# Patient Record
Sex: Female | Born: 1945 | Race: Black or African American | Hispanic: No | Smoking: Never smoker
Health system: Southern US, Community
[De-identification: ages and names within clinical notes are randomized; demographics above are authoritative.]

## PROBLEM LIST (undated history)

## (undated) ENCOUNTER — Emergency Department (HOSPITAL_COMMUNITY): Admission: EM

## (undated) DIAGNOSIS — I10 Essential (primary) hypertension: Secondary | ICD-10-CM

## (undated) DIAGNOSIS — I639 Cerebral infarction, unspecified: Secondary | ICD-10-CM

## (undated) DIAGNOSIS — E119 Type 2 diabetes mellitus without complications: Secondary | ICD-10-CM

## (undated) DIAGNOSIS — K219 Gastro-esophageal reflux disease without esophagitis: Secondary | ICD-10-CM

## (undated) DIAGNOSIS — Z9289 Personal history of other medical treatment: Secondary | ICD-10-CM

## (undated) HISTORY — PX: ABDOMINAL HYSTERECTOMY: SHX81

## (undated) NOTE — *Deleted (*Deleted)
  Physical Exam  BP (!) 165/92   Pulse 78   Temp 98.4 F (36.9 C) (Oral)   Resp 20   Ht 1.626 m (5\' 4" )   Wt 70 kg   SpO2 98%   BMI 26.49 kg/m   Physical Exam  ED Course/Procedures     Procedures  MDM  Patient with multiple recent presentations for hand tingling.  She describes this as neck pain rating down her shoulders and paresthesias in her hands.  She has been seen multiple times and had a CT performed that question multiple lytic lesions.  She has been referred for outpatient and has not followed up but has returned to the ED.  Today MRI was ordered of her neck.  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  Also left maxillary sinusitis with opacification is noted. I discussed this finding with with Dr. Grace Isaac, radiologist who read the scan.  He did not see lytic lesions on the MRI and feels that this was probably secondary to osteopenia. Discussed with Mr. Julien Girt, NP on for neurosurgery- requests plain films with flex/extension and will see in consult  Discussed plan with patient  Dispo as per neurosurgery reccomendations    Margarita Grizzle, MD 11/10/19 1204

---

## 1999-07-23 ENCOUNTER — Emergency Department (HOSPITAL_COMMUNITY): Admission: EM | Admit: 1999-07-23 | Discharge: 1999-07-23 | Payer: Self-pay | Admitting: Emergency Medicine

## 1999-07-23 ENCOUNTER — Encounter: Payer: Self-pay | Admitting: Emergency Medicine

## 1999-07-28 ENCOUNTER — Emergency Department (HOSPITAL_COMMUNITY): Admission: EM | Admit: 1999-07-28 | Discharge: 1999-07-28 | Payer: Self-pay | Admitting: Emergency Medicine

## 1999-09-08 ENCOUNTER — Ambulatory Visit (HOSPITAL_COMMUNITY): Admission: RE | Admit: 1999-09-08 | Discharge: 1999-09-08 | Payer: Self-pay | Admitting: Sports Medicine

## 1999-09-08 ENCOUNTER — Encounter: Payer: Self-pay | Admitting: Sports Medicine

## 2001-10-15 ENCOUNTER — Ambulatory Visit (HOSPITAL_COMMUNITY): Admission: RE | Admit: 2001-10-15 | Discharge: 2001-10-15 | Payer: Self-pay | Admitting: Internal Medicine

## 2001-10-15 ENCOUNTER — Encounter: Payer: Self-pay | Admitting: Internal Medicine

## 2003-10-04 ENCOUNTER — Ambulatory Visit: Payer: Self-pay | Admitting: Nurse Practitioner

## 2003-10-11 ENCOUNTER — Ambulatory Visit (HOSPITAL_COMMUNITY): Admission: RE | Admit: 2003-10-11 | Discharge: 2003-10-11 | Payer: Self-pay | Admitting: Family Medicine

## 2003-10-20 ENCOUNTER — Ambulatory Visit: Payer: Self-pay | Admitting: Nurse Practitioner

## 2003-10-26 ENCOUNTER — Ambulatory Visit: Payer: Self-pay | Admitting: *Deleted

## 2003-11-03 ENCOUNTER — Ambulatory Visit: Payer: Self-pay | Admitting: Nurse Practitioner

## 2003-11-23 ENCOUNTER — Ambulatory Visit: Payer: Self-pay | Admitting: Nurse Practitioner

## 2003-12-08 ENCOUNTER — Ambulatory Visit: Payer: Self-pay | Admitting: Nurse Practitioner

## 2004-04-06 ENCOUNTER — Ambulatory Visit: Payer: Self-pay | Admitting: Nurse Practitioner

## 2004-05-04 ENCOUNTER — Ambulatory Visit: Payer: Self-pay | Admitting: Internal Medicine

## 2004-08-10 ENCOUNTER — Ambulatory Visit: Payer: Self-pay | Admitting: Nurse Practitioner

## 2005-03-15 ENCOUNTER — Ambulatory Visit: Payer: Self-pay | Admitting: Nurse Practitioner

## 2005-03-29 ENCOUNTER — Ambulatory Visit: Payer: Self-pay | Admitting: Nurse Practitioner

## 2005-04-12 ENCOUNTER — Ambulatory Visit: Payer: Self-pay | Admitting: Nurse Practitioner

## 2005-05-17 ENCOUNTER — Ambulatory Visit: Payer: Self-pay | Admitting: Nurse Practitioner

## 2005-07-25 ENCOUNTER — Emergency Department (HOSPITAL_COMMUNITY): Admission: EM | Admit: 2005-07-25 | Discharge: 2005-07-25 | Payer: Self-pay | Admitting: Emergency Medicine

## 2005-08-09 ENCOUNTER — Ambulatory Visit: Payer: Self-pay | Admitting: Nurse Practitioner

## 2005-08-23 ENCOUNTER — Ambulatory Visit: Payer: Self-pay | Admitting: Nurse Practitioner

## 2005-10-25 ENCOUNTER — Ambulatory Visit: Payer: Self-pay | Admitting: Nurse Practitioner

## 2005-12-11 ENCOUNTER — Ambulatory Visit: Payer: Self-pay | Admitting: Nurse Practitioner

## 2005-12-25 ENCOUNTER — Ambulatory Visit: Payer: Self-pay | Admitting: Nurse Practitioner

## 2006-03-08 ENCOUNTER — Emergency Department (HOSPITAL_COMMUNITY): Admission: EM | Admit: 2006-03-08 | Discharge: 2006-03-08 | Payer: Self-pay | Admitting: Emergency Medicine

## 2006-04-30 ENCOUNTER — Emergency Department (HOSPITAL_COMMUNITY): Admission: EM | Admit: 2006-04-30 | Discharge: 2006-04-30 | Payer: Self-pay | Admitting: Emergency Medicine

## 2006-07-03 ENCOUNTER — Ambulatory Visit: Payer: Self-pay | Admitting: Internal Medicine

## 2006-09-04 DIAGNOSIS — M545 Low back pain, unspecified: Secondary | ICD-10-CM | POA: Insufficient documentation

## 2006-09-04 DIAGNOSIS — K219 Gastro-esophageal reflux disease without esophagitis: Secondary | ICD-10-CM

## 2006-09-04 DIAGNOSIS — F329 Major depressive disorder, single episode, unspecified: Secondary | ICD-10-CM | POA: Insufficient documentation

## 2006-09-04 DIAGNOSIS — F3289 Other specified depressive episodes: Secondary | ICD-10-CM | POA: Insufficient documentation

## 2006-09-04 DIAGNOSIS — M129 Arthropathy, unspecified: Secondary | ICD-10-CM | POA: Insufficient documentation

## 2006-09-04 DIAGNOSIS — I10 Essential (primary) hypertension: Secondary | ICD-10-CM | POA: Insufficient documentation

## 2006-09-04 DIAGNOSIS — M1990 Unspecified osteoarthritis, unspecified site: Secondary | ICD-10-CM | POA: Insufficient documentation

## 2006-09-04 HISTORY — DX: Gastro-esophageal reflux disease without esophagitis: K21.9

## 2006-10-09 ENCOUNTER — Encounter (INDEPENDENT_AMBULATORY_CARE_PROVIDER_SITE_OTHER): Payer: Self-pay | Admitting: *Deleted

## 2006-12-20 ENCOUNTER — Emergency Department (HOSPITAL_COMMUNITY): Admission: EM | Admit: 2006-12-20 | Discharge: 2006-12-20 | Payer: Self-pay | Admitting: Emergency Medicine

## 2006-12-24 ENCOUNTER — Encounter (INDEPENDENT_AMBULATORY_CARE_PROVIDER_SITE_OTHER): Payer: Self-pay | Admitting: Nurse Practitioner

## 2006-12-24 ENCOUNTER — Ambulatory Visit: Payer: Self-pay | Admitting: Family Medicine

## 2006-12-24 LAB — CONVERTED CEMR LAB
ALT: 19 units/L (ref 0–35)
AST: 20 units/L (ref 0–37)
Albumin: 4.2 g/dL (ref 3.5–5.2)
Alkaline Phosphatase: 165 units/L — ABNORMAL HIGH (ref 39–117)
BUN: 9 mg/dL (ref 6–23)
Basophils Absolute: 0 10*3/uL (ref 0.0–0.1)
Basophils Relative: 0 % (ref 0–1)
CO2: 29 meq/L (ref 19–32)
Calcium: 9 mg/dL (ref 8.4–10.5)
Chloride: 98 meq/L (ref 96–112)
Cholesterol: 202 mg/dL — ABNORMAL HIGH (ref 0–200)
Creatinine, Ser: 0.98 mg/dL (ref 0.40–1.20)
Eosinophils Absolute: 0.2 10*3/uL (ref 0.2–0.7)
Eosinophils Relative: 2 % (ref 0–5)
Glucose, Bld: 121 mg/dL — ABNORMAL HIGH (ref 70–99)
HCT: 40.8 % (ref 36.0–46.0)
HDL: 48 mg/dL (ref >39)
Hemoglobin: 13.1 g/dL (ref 12.0–15.0)
LDL Cholesterol: 105 mg/dL — ABNORMAL HIGH (ref 0–99)
Lymphocytes Relative: 22 % (ref 12–46)
Lymphs Abs: 1.8 10*3/uL (ref 0.7–4.0)
MCHC: 32.1 g/dL (ref 30.0–36.0)
MCV: 93.8 fL (ref 78.0–100.0)
Monocytes Absolute: 0.7 10*3/uL (ref 0.1–1.0)
Monocytes Relative: 8 % (ref 3–12)
Neutro Abs: 5.6 10*3/uL (ref 1.7–7.7)
Neutrophils Relative %: 67 % (ref 43–77)
Platelets: 294 10*3/uL (ref 150–400)
Potassium: 3.1 meq/L — ABNORMAL LOW (ref 3.5–5.3)
RBC: 4.35 M/uL (ref 3.87–5.11)
RDW: 12.8 % (ref 11.5–15.5)
Sodium: 141 meq/L (ref 135–145)
TSH: 1.483 microintl units/mL (ref 0.350–5.50)
Total Bilirubin: 0.4 mg/dL (ref 0.3–1.2)
Total CHOL/HDL Ratio: 4.2
Total Protein: 8.8 g/dL — ABNORMAL HIGH (ref 6.0–8.3)
Triglycerides: 244 mg/dL — ABNORMAL HIGH (ref <150)
VLDL: 49 mg/dL — ABNORMAL HIGH (ref 0–40)
WBC: 8.3 10*3/uL (ref 4.0–10.5)

## 2007-01-24 ENCOUNTER — Encounter (INDEPENDENT_AMBULATORY_CARE_PROVIDER_SITE_OTHER): Payer: Self-pay | Admitting: Nurse Practitioner

## 2007-01-24 LAB — CONVERTED CEMR LAB

## 2007-01-25 ENCOUNTER — Encounter (INDEPENDENT_AMBULATORY_CARE_PROVIDER_SITE_OTHER): Payer: Self-pay | Admitting: Nurse Practitioner

## 2007-02-24 ENCOUNTER — Ambulatory Visit: Payer: Self-pay | Admitting: Internal Medicine

## 2007-02-24 LAB — CONVERTED CEMR LAB
Free T4: 1.22 ng/dL (ref 0.89–1.80)
TSH: 1.68 microintl units/mL (ref 0.350–5.50)

## 2007-04-08 ENCOUNTER — Encounter (INDEPENDENT_AMBULATORY_CARE_PROVIDER_SITE_OTHER): Payer: Self-pay | Admitting: Nurse Practitioner

## 2007-04-08 ENCOUNTER — Ambulatory Visit: Payer: Self-pay | Admitting: Family Medicine

## 2007-04-08 LAB — CONVERTED CEMR LAB
ALT: 31 units/L (ref 0–35)
AST: 24 units/L (ref 0–37)
Albumin: 4.3 g/dL (ref 3.5–5.2)
Alkaline Phosphatase: 118 units/L — ABNORMAL HIGH (ref 39–117)
BUN: 13 mg/dL (ref 6–23)
Basophils Absolute: 0 10*3/uL (ref 0.0–0.1)
Basophils Relative: 0 % (ref 0–1)
CO2: 31 meq/L (ref 19–32)
Calcium: 9.4 mg/dL (ref 8.4–10.5)
Chloride: 98 meq/L (ref 96–112)
Creatinine, Ser: 0.92 mg/dL (ref 0.40–1.20)
Eosinophils Absolute: 0.1 10*3/uL (ref 0.0–0.7)
Eosinophils Relative: 2 % (ref 0–5)
Glucose, Bld: 99 mg/dL (ref 70–99)
HCT: 39.1 % (ref 36.0–46.0)
Hemoglobin: 12.7 g/dL (ref 12.0–15.0)
Lymphocytes Relative: 28 % (ref 12–46)
Lymphs Abs: 1.6 10*3/uL (ref 0.7–4.0)
MCHC: 32.5 g/dL (ref 30.0–36.0)
MCV: 93.1 fL (ref 78.0–100.0)
Monocytes Absolute: 0.5 10*3/uL (ref 0.1–1.0)
Monocytes Relative: 9 % (ref 3–12)
Neutro Abs: 3.6 10*3/uL (ref 1.7–7.7)
Neutrophils Relative %: 62 % (ref 43–77)
Platelets: 210 10*3/uL (ref 150–400)
Potassium: 3.4 meq/L — ABNORMAL LOW (ref 3.5–5.3)
RBC: 4.2 M/uL (ref 3.87–5.11)
RDW: 13.2 % (ref 11.5–15.5)
Sodium: 141 meq/L (ref 135–145)
TSH: 2.444 microintl units/mL (ref 0.350–5.50)
Total Bilirubin: 0.3 mg/dL (ref 0.3–1.2)
Total Protein: 7.9 g/dL (ref 6.0–8.3)
WBC: 5.8 10*3/uL (ref 4.0–10.5)

## 2007-04-16 ENCOUNTER — Ambulatory Visit (HOSPITAL_COMMUNITY): Admission: RE | Admit: 2007-04-16 | Discharge: 2007-04-16 | Payer: Self-pay | Admitting: Family Medicine

## 2007-08-21 ENCOUNTER — Ambulatory Visit: Payer: Self-pay | Admitting: Internal Medicine

## 2007-08-25 ENCOUNTER — Ambulatory Visit: Payer: Self-pay | Admitting: Internal Medicine

## 2007-10-31 ENCOUNTER — Ambulatory Visit: Payer: Self-pay | Admitting: Internal Medicine

## 2007-10-31 ENCOUNTER — Emergency Department (HOSPITAL_COMMUNITY): Admission: EM | Admit: 2007-10-31 | Discharge: 2007-10-31 | Payer: Self-pay | Admitting: Emergency Medicine

## 2008-01-07 ENCOUNTER — Ambulatory Visit: Payer: Self-pay | Admitting: Internal Medicine

## 2008-01-07 ENCOUNTER — Encounter (INDEPENDENT_AMBULATORY_CARE_PROVIDER_SITE_OTHER): Payer: Self-pay | Admitting: Internal Medicine

## 2008-01-07 LAB — CONVERTED CEMR LAB
ALT: 32 units/L (ref 0–35)
AST: 31 units/L (ref 0–37)
Albumin: 4.2 g/dL (ref 3.5–5.2)
Alkaline Phosphatase: 132 units/L — ABNORMAL HIGH (ref 39–117)
BUN: 13 mg/dL (ref 6–23)
CO2: 28 meq/L (ref 19–32)
Calcium: 8.7 mg/dL (ref 8.4–10.5)
Chloride: 98 meq/L (ref 96–112)
Cholesterol: 251 mg/dL — ABNORMAL HIGH (ref 0–200)
Creatinine, Ser: 1.07 mg/dL (ref 0.40–1.20)
Glucose, Bld: 144 mg/dL — ABNORMAL HIGH (ref 70–99)
HDL: 68 mg/dL (ref >39)
LDL Cholesterol: 162 mg/dL — ABNORMAL HIGH (ref 0–99)
Potassium: 3.9 meq/L (ref 3.5–5.3)
Sodium: 141 meq/L (ref 135–145)
Total Bilirubin: 0.5 mg/dL (ref 0.3–1.2)
Total CHOL/HDL Ratio: 3.7
Total Protein: 8 g/dL (ref 6.0–8.3)
Triglycerides: 103 mg/dL (ref <150)
VLDL: 21 mg/dL (ref 0–40)

## 2008-03-15 ENCOUNTER — Ambulatory Visit: Payer: Self-pay | Admitting: Internal Medicine

## 2008-03-15 ENCOUNTER — Encounter (INDEPENDENT_AMBULATORY_CARE_PROVIDER_SITE_OTHER): Payer: Self-pay | Admitting: Internal Medicine

## 2008-03-15 LAB — CONVERTED CEMR LAB
ALT: 19 units/L (ref 0–35)
AST: 23 units/L (ref 0–37)
Albumin: 4.2 g/dL (ref 3.5–5.2)
Alkaline Phosphatase: 121 units/L — ABNORMAL HIGH (ref 39–117)
BUN: 12 mg/dL (ref 6–23)
CO2: 28 meq/L (ref 19–32)
Calcium: 9 mg/dL (ref 8.4–10.5)
Chloride: 99 meq/L (ref 96–112)
Cholesterol: 150 mg/dL (ref 0–200)
Creatinine, Ser: 1.02 mg/dL (ref 0.40–1.20)
Glucose, Bld: 215 mg/dL — ABNORMAL HIGH (ref 70–99)
HDL: 55 mg/dL (ref >39)
LDL Cholesterol: 65 mg/dL (ref 0–99)
Potassium: 3.6 meq/L (ref 3.5–5.3)
Sodium: 139 meq/L (ref 135–145)
Total Bilirubin: 0.3 mg/dL (ref 0.3–1.2)
Total CHOL/HDL Ratio: 2.7
Total Protein: 8.1 g/dL (ref 6.0–8.3)
Triglycerides: 152 mg/dL — ABNORMAL HIGH (ref <150)
VLDL: 30 mg/dL (ref 0–40)

## 2008-04-05 ENCOUNTER — Ambulatory Visit: Payer: Self-pay | Admitting: Internal Medicine

## 2008-04-19 ENCOUNTER — Ambulatory Visit: Payer: Self-pay | Admitting: Internal Medicine

## 2008-04-21 ENCOUNTER — Ambulatory Visit (HOSPITAL_COMMUNITY): Admission: RE | Admit: 2008-04-21 | Discharge: 2008-04-21 | Payer: Self-pay | Admitting: Internal Medicine

## 2008-04-27 ENCOUNTER — Ambulatory Visit: Payer: Self-pay | Admitting: Internal Medicine

## 2008-04-29 ENCOUNTER — Ambulatory Visit: Payer: Self-pay | Admitting: Internal Medicine

## 2008-04-30 ENCOUNTER — Ambulatory Visit (HOSPITAL_COMMUNITY): Admission: RE | Admit: 2008-04-30 | Discharge: 2008-04-30 | Payer: Self-pay | Admitting: Internal Medicine

## 2008-05-10 ENCOUNTER — Ambulatory Visit: Payer: Self-pay | Admitting: Internal Medicine

## 2008-06-01 ENCOUNTER — Ambulatory Visit: Payer: Self-pay | Admitting: Internal Medicine

## 2008-06-14 ENCOUNTER — Ambulatory Visit: Payer: Self-pay | Admitting: Internal Medicine

## 2008-07-29 ENCOUNTER — Ambulatory Visit: Payer: Self-pay | Admitting: Internal Medicine

## 2008-08-30 ENCOUNTER — Ambulatory Visit: Payer: Self-pay | Admitting: Internal Medicine

## 2008-08-30 ENCOUNTER — Encounter (INDEPENDENT_AMBULATORY_CARE_PROVIDER_SITE_OTHER): Payer: Self-pay | Admitting: Internal Medicine

## 2008-08-30 LAB — CONVERTED CEMR LAB: Microalb, Ur: 0.97 mg/dL (ref 0.00–1.89)

## 2008-09-06 ENCOUNTER — Ambulatory Visit (HOSPITAL_COMMUNITY): Admission: RE | Admit: 2008-09-06 | Discharge: 2008-09-06 | Payer: Self-pay | Admitting: Internal Medicine

## 2008-10-01 ENCOUNTER — Ambulatory Visit: Payer: Self-pay | Admitting: Internal Medicine

## 2008-10-01 ENCOUNTER — Encounter: Payer: Self-pay | Admitting: Internal Medicine

## 2008-11-26 ENCOUNTER — Ambulatory Visit: Payer: Self-pay | Admitting: Internal Medicine

## 2008-11-26 DIAGNOSIS — R079 Chest pain, unspecified: Secondary | ICD-10-CM | POA: Insufficient documentation

## 2008-12-02 ENCOUNTER — Telehealth (INDEPENDENT_AMBULATORY_CARE_PROVIDER_SITE_OTHER): Payer: Self-pay

## 2008-12-06 ENCOUNTER — Encounter (HOSPITAL_COMMUNITY): Admission: RE | Admit: 2008-12-06 | Discharge: 2009-01-19 | Payer: Self-pay | Admitting: Internal Medicine

## 2008-12-06 ENCOUNTER — Ambulatory Visit: Payer: Self-pay | Admitting: Internal Medicine

## 2008-12-06 ENCOUNTER — Ambulatory Visit: Payer: Self-pay

## 2008-12-20 ENCOUNTER — Ambulatory Visit: Payer: Self-pay | Admitting: Internal Medicine

## 2009-01-03 ENCOUNTER — Ambulatory Visit: Payer: Self-pay | Admitting: Internal Medicine

## 2009-01-10 ENCOUNTER — Encounter (INDEPENDENT_AMBULATORY_CARE_PROVIDER_SITE_OTHER): Payer: Self-pay | Admitting: Internal Medicine

## 2009-01-10 ENCOUNTER — Ambulatory Visit: Payer: Self-pay | Admitting: Internal Medicine

## 2009-01-10 LAB — CONVERTED CEMR LAB
ALT: 17 units/L (ref 0–35)
AST: 20 units/L (ref 0–37)
Albumin: 4.1 g/dL (ref 3.5–5.2)
Alkaline Phosphatase: 122 units/L — ABNORMAL HIGH (ref 39–117)
BUN: 13 mg/dL (ref 6–23)
CO2: 28 meq/L (ref 19–32)
Calcium: 9.4 mg/dL (ref 8.4–10.5)
Chloride: 101 meq/L (ref 96–112)
Cholesterol: 156 mg/dL (ref 0–200)
Creatinine, Ser: 1.15 mg/dL (ref 0.40–1.20)
Glucose, Bld: 153 mg/dL — ABNORMAL HIGH (ref 70–99)
HDL: 51 mg/dL (ref >39)
LDL Cholesterol: 80 mg/dL (ref 0–99)
Potassium: 4.3 meq/L (ref 3.5–5.3)
Sodium: 141 meq/L (ref 135–145)
Total Bilirubin: 0.5 mg/dL (ref 0.3–1.2)
Total CHOL/HDL Ratio: 3.1
Total Protein: 7.8 g/dL (ref 6.0–8.3)
Triglycerides: 126 mg/dL (ref <150)
VLDL: 25 mg/dL (ref 0–40)

## 2009-01-12 ENCOUNTER — Encounter (INDEPENDENT_AMBULATORY_CARE_PROVIDER_SITE_OTHER): Payer: Self-pay | Admitting: *Deleted

## 2009-01-28 ENCOUNTER — Telehealth (INDEPENDENT_AMBULATORY_CARE_PROVIDER_SITE_OTHER): Payer: Self-pay | Admitting: *Deleted

## 2009-01-28 ENCOUNTER — Ambulatory Visit: Payer: Self-pay | Admitting: Family Medicine

## 2009-01-28 DIAGNOSIS — J069 Acute upper respiratory infection, unspecified: Secondary | ICD-10-CM | POA: Insufficient documentation

## 2009-01-28 DIAGNOSIS — M2550 Pain in unspecified joint: Secondary | ICD-10-CM | POA: Insufficient documentation

## 2009-01-28 LAB — CONVERTED CEMR LAB
Basophils Absolute: 0 10*3/uL (ref 0.0–0.1)
Basophils Relative: 0 % (ref 0–1)
Eosinophils Absolute: 0.1 10*3/uL (ref 0.0–0.7)
Eosinophils Relative: 2 % (ref 0–5)
HCT: 41.4 % (ref 36.0–46.0)
Hemoglobin: 13.6 g/dL (ref 12.0–15.0)
Lymphocytes Relative: 19 % (ref 12–46)
Lymphs Abs: 1.1 10*3/uL (ref 0.7–4.0)
MCHC: 32.9 g/dL (ref 30.0–36.0)
MCV: 91.6 fL (ref 78.0–100.0)
Monocytes Absolute: 0.6 10*3/uL (ref 0.1–1.0)
Monocytes Relative: 9 % (ref 3–12)
Neutro Abs: 4.1 10*3/uL (ref 1.7–7.7)
Neutrophils Relative %: 69 % (ref 43–77)
Platelets: 186 10*3/uL (ref 150–400)
RBC: 4.52 M/uL (ref 3.87–5.11)
RDW: 13 % (ref 11.5–15.5)
Rhuematoid fact SerPl-aCnc: <20 intl units/mL (ref 0–20)
Sed Rate: 73 mm/hr — ABNORMAL HIGH (ref 0–22)
TSH: 2.761 microintl units/mL (ref 0.350–4.500)
Vit D, 25-Hydroxy: 19 ng/mL — ABNORMAL LOW (ref 30–89)
WBC: 5.9 10*3/uL (ref 4.0–10.5)

## 2009-02-03 ENCOUNTER — Encounter (INDEPENDENT_AMBULATORY_CARE_PROVIDER_SITE_OTHER): Payer: Self-pay | Admitting: Family Medicine

## 2009-02-03 LAB — CONVERTED CEMR LAB
Anti Nuclear Antibody(ANA): NEGATIVE
Total CK: 713 units/L — ABNORMAL HIGH (ref 7–177)

## 2009-02-14 ENCOUNTER — Ambulatory Visit: Payer: Self-pay | Admitting: Internal Medicine

## 2009-04-11 ENCOUNTER — Ambulatory Visit: Payer: Self-pay | Admitting: Internal Medicine

## 2009-04-12 ENCOUNTER — Encounter (INDEPENDENT_AMBULATORY_CARE_PROVIDER_SITE_OTHER): Payer: Self-pay | Admitting: Family Medicine

## 2009-04-26 ENCOUNTER — Ambulatory Visit: Payer: Self-pay | Admitting: Family Medicine

## 2009-04-26 ENCOUNTER — Encounter (INDEPENDENT_AMBULATORY_CARE_PROVIDER_SITE_OTHER): Payer: Self-pay | Admitting: Family Medicine

## 2009-04-26 LAB — CONVERTED CEMR LAB
ALT: 14 units/L (ref 0–35)
AST: 20 units/L (ref 0–37)
Albumin: 4.2 g/dL (ref 3.5–5.2)
Alkaline Phosphatase: 108 units/L (ref 39–117)
BUN: 15 mg/dL (ref 6–23)
CO2: 28 meq/L (ref 19–32)
Calcium: 9.4 mg/dL (ref 8.4–10.5)
Chloride: 99 meq/L (ref 96–112)
Creatinine, Ser: 1.04 mg/dL (ref 0.40–1.20)
Glucose, Bld: 132 mg/dL — ABNORMAL HIGH (ref 70–99)
Potassium: 4.2 meq/L (ref 3.5–5.3)
Sed Rate: 58 mm/hr — ABNORMAL HIGH (ref 0–22)
Sodium: 140 meq/L (ref 135–145)
Total Bilirubin: 0.4 mg/dL (ref 0.3–1.2)
Total CK: 198 units/L — ABNORMAL HIGH (ref 7–177)
Total Protein: 8 g/dL (ref 6.0–8.3)

## 2009-07-09 ENCOUNTER — Emergency Department (HOSPITAL_COMMUNITY): Admission: EM | Admit: 2009-07-09 | Discharge: 2009-07-10 | Payer: Self-pay | Admitting: Emergency Medicine

## 2009-07-18 ENCOUNTER — Ambulatory Visit: Payer: Self-pay | Admitting: Family Medicine

## 2009-11-10 ENCOUNTER — Ambulatory Visit (HOSPITAL_COMMUNITY)
Admission: RE | Admit: 2009-11-10 | Discharge: 2009-11-10 | Payer: Self-pay | Source: Home / Self Care | Admitting: Internal Medicine

## 2010-02-21 NOTE — Miscellaneous (Signed)
Summary: VIP  Patient: [name] Note: All result statuses are Final unless otherwise noted.  Tests: (1) VIP (Medications)   LLIMPORTMEDS              "Result Below..."       RESULT: TRAMADOL HCL TABS 50 MG*TAKE 1 OR 2 TABLETS BY MOUTH EVERY 4 TO 6 HOURS AS NEEDED FOR PAIN*01/02/2006*Last Refill: 04/10/2006*[id number]*******   LLIMPORTMEDS              "Result Below..."       RESULT: TIAZAC CP24 360 MG*TAKE ONE (1) CAPSULE EACH DAY   GENE HAS ICP TIAZAC 360MG  0908*09/27/2006*Last Refill: 10/23/2006*[id number]*******   LLIMPORTMEDS              "Result Below..."       RESULT: PROTONIX TBEC 40 MG*TAKE ONE (1) TABLET BY MOUTH EVERY DAY*07/29/2006*Last Refill: 10/23/2006*[id number]*******   LLIMPORTMEDS              "Result Below..."       RESULT: LIPITOR TABS 20 MG*TAKE ONE (1) TABLET EACH DAY   GENE HAS ICP LIPITOR 20MG  1007*12/11/2005*Last Refill: 10/24/2006*[id number]*******   LLIMPORTMEDS              "Result Below..."       RESULT: HYDROCHLOROTHIAZIDE TABS 25 MG*TAKE ONE (1) TABLET EACH DAY*07/29/2006*Last Refill: 10/23/2006*[id number]*******   LLIMPORTMEDS              "Result Below..."       RESULT: CYCLOBENZAPRINE HCL TABS 10 MG*TAKE ONE TABLET BY MOUTH EVERY NIGHT AT BEDTIME*12/11/2005*Last Refill: 05/13/2006*[id number]*******   LLIMPORTMEDS              "Result Below..."       RESULT: CLONIDINE HCL TABS 0.1 MG*TAKE ONE (1) TABLET BY MOUTH TWO (2) TIMES DAILY*10/24/2006*Last Refill: [id number]*******   [id number]              [id number]*[id number]**  Note: An exclamation mark (!) indicates a result that was not dispersed into the flowsheet. Document Creation Date: 11/21/2006 3:00 PM _______________________________________________________________________  (1) Order result status: Final Collection or observation date-time: 10/09/2006 Requested date-time: 10/09/2006 Receipt date-time:  Reported date-time: 10/09/2006 Referring Physician:   Ordering Physician:   Specimen Source:  Source: [place] Order Number:   Lab site:

## 2010-02-21 NOTE — Letter (Signed)
Summary: *HSN Results Follow up  [facility]  [address]   [place], [place] [address]   Phone: [phone]  Fax: [phone]      01/12/2009   [facility] [address] [place], [place]  [address]   Dear  Ms. [facility] Lupinski,                            ____S.Drinkard,FNP   ____D. Gore,FNP       ____B. McPherson,MD   ____V. Rankins,MD    ____E. Mulberry,MD    ____N. Daphine Deutscher, FNP  ____D. Reche Dixon, MD    ____K. Philipp Deputy, MD    __X__Other Madaline Savage Id-Din, ANP     This letter is to inform you that your recent test(s):  _______Pap Smear    ___X____Lab Test     _______X-ray    _______ is within acceptable limits  _______ requires a medication change  _______ requires a follow-up lab visit  _______ requires a follow-up visit with your provider   Comments: Your blood sugar is elevated at 153. We recommend you consider starting on oral diabetic medication. If you are interested, please call our office for an appointment.        _________________________________________________________ If you have any questions, please contact our office                     Sincerely,  Blondell Reveal RN [address]

## 2010-02-21 NOTE — Progress Notes (Signed)
Summary: triage/cough stress incontinence  Phone Note Call from Patient   Caller: Patient Reason for Call: Talk to Nurse Summary of Call: Patient states she has been sick for one week with a dry cough..and stress incontience with urine.Marland KitchenMarland KitchenShe is daibetic.Marland Kitchenand have a history of HTN..Patient is upset saying everytime she coughs she wets herself... Initial call taken by: Conchita Paris,  January 28, 2009 12:17 PM

## 2010-02-21 NOTE — Miscellaneous (Signed)
Summary: Office Visit ([facility])    Visit Type:  acute visit   History of Present Illness: Cough. Quality: wet- + clear mucus production Severity: 10/10 Associated S/S: weakness, H/A, rhinorrhea-clear, loose BM x 1 Timing: constant Context: always. Duration: 1 week Improves: Nothing Worsens: Nothing OTCs not helping. Denies f/c/ns, n/v, diarrhea/constipation  Coughing causes urine leakage, laughs. Urinary frequency when sugars up only, no urge incontinence.  Denies any current problems with depression.  +Body pain, in joints and muscles, everywhere all the time, increased with coughing.  Allergies: 1)  ! Ibuprofen 2)  ! * Codeine     Impression & Recommendations:  Problem # 1:  URI (ICD-465.9)  Her updated medication list for this problem includes:    Tussionex Pennkinetic Er 8-10 Mg/42ml Lqcr (Chlorpheniramine-hydrocodone) .Marland KitchenMarland KitchenMarland KitchenMarland Kitchen 5ml by mouth q12 hrs as needed cough  Problem # 2:  ARTHRALGIA (ICD-719.40) Chronic. Labs ESR, RF, ANA, CBC, TSH  Complete Medication List: 1)  Pravastatin Sodium 40 Mg Tabs (Pravastatin sodium) .Marland Kitchen.. 1 by mouth daily 2)  Clonidine Hcl 0.1 Mg Tabs (Clonidine hcl) .... One tablet by mouth two times day 3)  Losartan Potassium 50 Mg Tabs (Losartan potassium) .Marland Kitchen.. 1 by mouth daily 4)  Cardizem Cd 360 Mg Xr24h-cap (Diltiazem hcl coated beads) .Marland Kitchen.. 1 by mouth daily 5)  Clonazepam 0.5 Mg Tbdp (Clonazepam) .Marland Kitchen.. 1 by mouth as needed 6)  Hydrochlorothiazide 25 Mg Tabs (Hydrochlorothiazide) .Marland Kitchen.. 1 by mouth daily 7)  Omeprazole 20 Mg Cpdr (Omeprazole) .Marland Kitchen.. 1 by mouth daily 8)  Tussionex Pennkinetic Er 8-10 Mg/35ml Lqcr (Chlorpheniramine-hydrocodone) .... 5ml by mouth q12 hrs as needed cough  Prescriptions: TUSSIONEX PENNKINETIC ER 8-10 MG/5ML LQCR (CHLORPHENIRAMINE-HYDROCODONE) 5mL by mouth q12 hrs as needed cough  #77mL x 0   Entered and Authorized by:   Syliva Overman MD   Signed by:   Syliva Overman MD on 01/28/2009   Method used:   Print then Give  to Patient   RxID:   [id number]

## 2010-02-21 NOTE — Assessment & Plan Note (Signed)
Summary: NP6/DYSLIPIDEMIA/JML  Medications Added PRAVASTATIN SODIUM 40 MG TABS (PRAVASTATIN SODIUM) 1 by mouth daily LOSARTAN POTASSIUM 50 MG TABS (LOSARTAN POTASSIUM) 1 by mouth daily CARDIZEM CD 360 MG XR24H-CAP (DILTIAZEM HCL COATED BEADS) 1 by mouth daily CLONAZEPAM 0.5 MG TBDP (CLONAZEPAM) 1 by mouth as needed HYDROCHLOROTHIAZIDE 25 MG TABS (HYDROCHLOROTHIAZIDE) 1 by mouth daily OMEPRAZOLE 20 MG CPDR (OMEPRAZOLE) 1 by mouth daily      Allergies Added:    Current Medications (verified): 1)  Pravastatin Sodium 40 Mg Tabs (Pravastatin Sodium) .Marland Kitchen.. 1 By Mouth Daily 2)  Clonidine Hcl 0.1 Mg  Tabs (Clonidine Hcl) .... One Tablet By Mouth Two Times Day 3)  Losartan Potassium 50 Mg Tabs (Losartan Potassium) .Marland Kitchen.. 1 By Mouth Daily 4)  Cardizem Cd 360 Mg Xr24h-Cap (Diltiazem Hcl Coated Beads) .Marland Kitchen.. 1 By Mouth Daily 5)  Clonazepam 0.5 Mg Tbdp (Clonazepam) .Marland Kitchen.. 1 By Mouth As Needed 6)  Hydrochlorothiazide 25 Mg Tabs (Hydrochlorothiazide) .Marland Kitchen.. 1 By Mouth Daily 7)  Omeprazole 20 Mg Cpdr (Omeprazole) .Marland Kitchen.. 1 By Mouth Daily  Allergies (verified): 1)  ! Ibuprofen 2)  ! * Codeine  Vital Signs:  Patient profile:   65 year old female Height:      65 inches Weight:      177 pounds BMI:     29.56 Pulse rate:   68 / minute Resp:     16 per minute BP sitting:   153 / 79  (right arm)  Vitals Entered By: Marrion Coy, CNA (November 26, 2008 12:25 PM)   Other Orders: EKG w/ Interpretation (93000) Nuclear Stress Test (Nuc Stress Test)  Appended Document: Office Visit - Infectious Disease      Current Allergies: ! IBUPROFEN ! * CODEINE  Patient Instructions: 1)  Your physician has requested that you have an exercise stress myoview.  For further information please visit [email or web address]  Please follow instruction sheet, as given. we will call you with results  Appended Document: NP6/DYSLIPIDEMIA/JML Patient is a 65 year old who was refered for cardic evaluation. The patient has no  known coronary artery disease.  She notes occasional chest pain more oftent at rest.  A pressure sensation.  Some  shortnes of breath with walking.  Does note dizziness but no syncope. The patient is a difficult historian.  She denies that she has diabetes yet she has a HgbA1C of 7.5.  Refuses meds. ALL:  none Meds: as noted PMH.  Chest pain Hypertension Dyslipidemia Diabetes. SHx:  No tobacco.  No EtOH FHx:  No family hx of premature CAD ROS:  All systems reviewed.  Negative to the above problem except as noted above. PE :  Patient in no acute distress HEENT:  NCAT.  NecK:  No JVD, no bruits.  LUngs:  CTA.  No rales or wheezes Cardiac:  RRR.  S1, S2.  No S3.  No murmurs. Abdomen:  Normal bowel sounds.  No masses.  No hepatomegaly Ext:  No edema.  2+ pulses Neuro:  A and O x 3.  CN II through XII intact EKG:  NSR.  68 bpm  Impression:  Patient is a 65 year old with multiple cardiac risk factors.  SHe is a difficult historian.  I am not convinced her chest pain is cardiac, but with her diabetes I would recomm a nuclear stress test to evaluate for ischemia. HTN:  Not optimally controlled.  Will defer to primary MD Dyslipidemia:  On statin.  F/U per primary physician.  Rec aggressive control since  glucose is high Diabetes:  Patient denies having.  Says sugar goes up when stressed.

## 2010-04-09 LAB — POCT I-STAT, CHEM 8
BUN: 14 mg/dL (ref 6–23)
Calcium, Ion: 1.12 mmol/L (ref 1.12–1.32)
Chloride: 105 mEq/L (ref 96–112)
Creatinine, Ser: 1.2 mg/dL (ref 0.4–1.2)
Glucose, Bld: 200 mg/dL — ABNORMAL HIGH (ref 70–99)
HCT: 42 % (ref 36.0–46.0)
Hemoglobin: 14.3 g/dL (ref 12.0–15.0)
Potassium: 3.4 mEq/L — ABNORMAL LOW (ref 3.5–5.1)
Sodium: 143 mEq/L (ref 135–145)
TCO2: 30 mmol/L (ref 0–100)

## 2010-04-09 LAB — URINE CULTURE
Colony Count: NO GROWTH
Culture: NO GROWTH

## 2010-04-09 LAB — DIFFERENTIAL
Basophils Absolute: 0.1 10*3/uL (ref 0.0–0.1)
Basophils Relative: 1 % (ref 0–1)
Eosinophils Absolute: 0 10*3/uL (ref 0.0–0.7)
Eosinophils Relative: 0 % (ref 0–5)
Lymphocytes Relative: 7 % — ABNORMAL LOW (ref 12–46)
Lymphs Abs: 1 10*3/uL (ref 0.7–4.0)
Monocytes Absolute: 0.4 10*3/uL (ref 0.1–1.0)
Monocytes Relative: 3 % (ref 3–12)
Neutro Abs: 12.1 10*3/uL — ABNORMAL HIGH (ref 1.7–7.7)
Neutrophils Relative %: 89 % — ABNORMAL HIGH (ref 43–77)

## 2010-04-09 LAB — URINALYSIS, ROUTINE W REFLEX MICROSCOPIC
Bilirubin Urine: NEGATIVE
Glucose, UA: NEGATIVE mg/dL
Ketones, ur: NEGATIVE mg/dL
Nitrite: NEGATIVE
Protein, ur: NEGATIVE mg/dL
Specific Gravity, Urine: 1.017 (ref 1.005–1.030)
Urobilinogen, UA: 0.2 mg/dL (ref 0.0–1.0)
pH: 5 (ref 5.0–8.0)

## 2010-04-09 LAB — CBC
HCT: 40.2 % (ref 36.0–46.0)
Hemoglobin: 13.4 g/dL (ref 12.0–15.0)
MCHC: 33.2 g/dL (ref 30.0–36.0)
MCV: 94.3 fL (ref 78.0–100.0)
Platelets: 232 10*3/uL (ref 150–400)
RBC: 4.26 MIL/uL (ref 3.87–5.11)
RDW: 13 % (ref 11.5–15.5)
WBC: 13.5 10*3/uL — ABNORMAL HIGH (ref 4.0–10.5)

## 2010-04-09 LAB — URINE MICROSCOPIC-ADD ON

## 2010-04-09 LAB — HEMOCCULT GUIAC POC 1CARD (OFFICE): Fecal Occult Bld: NEGATIVE

## 2011-02-05 ENCOUNTER — Encounter: Payer: Self-pay | Admitting: Internal Medicine

## 2011-02-15 ENCOUNTER — Ambulatory Visit: Payer: Self-pay | Admitting: Internal Medicine

## 2011-05-02 ENCOUNTER — Ambulatory Visit: Payer: Self-pay | Admitting: *Deleted

## 2011-11-17 ENCOUNTER — Encounter (HOSPITAL_COMMUNITY): Payer: Self-pay | Admitting: Emergency Medicine

## 2011-11-17 ENCOUNTER — Emergency Department (HOSPITAL_COMMUNITY)
Admission: EM | Admit: 2011-11-17 | Discharge: 2011-11-17 | Disposition: A | Discharge status: Home / Self Care | Payer: PRIVATE HEALTH INSURANCE | Attending: Emergency Medicine | Admitting: Emergency Medicine

## 2011-11-17 DIAGNOSIS — Z79899 Other long term (current) drug therapy: Secondary | ICD-10-CM | POA: Insufficient documentation

## 2011-11-17 DIAGNOSIS — N3090 Cystitis, unspecified without hematuria: Secondary | ICD-10-CM | POA: Insufficient documentation

## 2011-11-17 LAB — URINALYSIS, ROUTINE W REFLEX MICROSCOPIC
Glucose, UA: NEGATIVE mg/dL
Ketones, ur: 15 mg/dL — AB
Nitrite: NEGATIVE
Protein, ur: >300 mg/dL — AB
Specific Gravity, Urine: 1.024 (ref 1.005–1.030)
Urobilinogen, UA: 1 mg/dL (ref 0.0–1.0)
pH: 7.5 (ref 5.0–8.0)

## 2011-11-17 LAB — URINE MICROSCOPIC-ADD ON

## 2011-11-17 MED ORDER — SULFAMETHOXAZOLE-TRIMETHOPRIM 800-160 MG PO TABS: 1.0000 | ORAL_TABLET | Freq: Two times a day (BID) | ORAL | Status: DC

## 2011-11-17 MED ORDER — PHENAZOPYRIDINE HCL 200 MG PO TABS: 200.0000 mg | ORAL_TABLET | Freq: Three times a day (TID) | ORAL | Status: DC

## 2011-11-17 NOTE — ED Notes (Signed)
Pt. Stated, I've been having lower abd. Pain and when i go to the bathroom it will hurt.

## 2011-11-17 NOTE — ED Notes (Signed)
Pt requesting female nurse and female doctor.

## 2011-11-17 NOTE — ED Notes (Signed)
Patient given a sprit and a saltine to make sure she was not going to vomit anymore. Patient took one two sips of sprite and one small bite of a cracker. Patient then vomited a large amount of brown emesis

## 2011-11-17 NOTE — ED Notes (Signed)
Patient discharged with instructions using the teach back method. Patient verbalized an understanding

## 2011-11-18 LAB — URINE CULTURE
Colony Count: 6000
Colony Count: 5000
Special Requests: NORMAL

## 2011-11-18 NOTE — ED Provider Notes (Signed)
History     CSN: [id number]  Arrival date & time 11/17/11  1258   First MD Initiated Contact with Patient 11/17/11 1332      Chief Complaint  Patient presents with  . Urinary Frequency    (Consider location/radiation/quality/duration/timing/severity/associated sxs/prior treatment) HPI Comments: [name] presents for evaluation of urinary frequency,  Burning pain with urination along with suprapubic pressure and low back pain.  Her symptoms started this morning.  She denies fevers, chills, abdominal pain, nausea, vomiting, flank pain and diarrhea.  She has taken no medications prior to arrival.    The history is provided by the patient.    History reviewed. No pertinent past medical history.  History reviewed. No pertinent past surgical history.  No family history on file.  History  Substance Use Topics  . Smoking status: Not on file  . Smokeless tobacco: Not on file  . Alcohol Use: No    OB History    Grav Para Term Preterm Abortions TAB SAB Ect Mult Living                  Review of Systems  Constitutional: Negative for fever.  HENT: Negative for congestion, sore throat and neck pain.   Eyes: Negative.   Respiratory: Negative for chest tightness and shortness of breath.   Cardiovascular: Negative for chest pain.  Gastrointestinal: Negative for nausea and abdominal pain.  Genitourinary: Positive for dysuria, urgency and frequency. Negative for hematuria.  Musculoskeletal: Negative for joint swelling and arthralgias.  Skin: Negative.  Negative for rash and wound.  Neurological: Negative for dizziness, weakness, light-headedness, numbness and headaches.  Hematological: Negative.   Psychiatric/Behavioral: Negative.     Allergies  Codeine and Ibuprofen  Home Medications   Current Outpatient Rx  Name Route Sig Dispense Refill  . ATENOLOL-CHLORTHALIDONE 50-25 MG PO TABS Oral Take 1 tablet by mouth daily.    Marland Kitchen CLONAZEPAM 0.5 MG PO TABS Oral Take 0.5 mg by  mouth daily.    Marland Kitchen CLONIDINE HCL 0.2 MG PO TABS Oral Take 0.2 mg by mouth 2 (two) times daily.    Marland Kitchen DILTIAZEM HCL ER COATED BEADS 360 MG PO CP24 Oral Take 360 mg by mouth daily.    Marland Kitchen EZETIMIBE 10 MG PO TABS Oral Take 10 mg by mouth daily.    . GLYBURIDE-METFORMIN 5-500 MG PO TABS Oral Take 2 tablets by mouth daily with breakfast. Patient is to use 2 tablets by mouth every morning, and 1 tablet at bedtime.    Marland Kitchen LOSARTAN POTASSIUM 100 MG PO TABS Oral Take 100 mg by mouth daily.    Marland Kitchen OMEPRAZOLE 20 MG PO CPDR Oral Take 20 mg by mouth daily.    Marland Kitchen PHENAZOPYRIDINE HCL 200 MG PO TABS Oral Take 1 tablet (200 mg total) by mouth 3 (three) times daily. 6 tablet 0  . SULFAMETHOXAZOLE-TRIMETHOPRIM 800-160 MG PO TABS Oral Take 1 tablet by mouth every 12 (twelve) hours. 10 tablet 0    BP 125/67  Pulse 61  Temp 97.8 F (36.6 C) (Oral)  Resp 18  SpO2 98%  Physical Exam  Nursing note and vitals reviewed. Constitutional: She appears well-developed and well-nourished.  HENT:  Head: Normocephalic and atraumatic.  Eyes: Conjunctivae normal are normal.  Neck: Normal range of motion.  Cardiovascular: Normal rate, regular rhythm, normal heart sounds and intact distal pulses.   Pulmonary/Chest: Effort normal and breath sounds normal. She has no wheezes.  Abdominal: Soft. Bowel sounds are normal. She exhibits no distension.  There is no tenderness.       No cva ttp.  Musculoskeletal: Normal range of motion.  Neurological: She is alert.  Skin: Skin is warm and dry.  Psychiatric: She has a normal mood and affect.    ED Course  Procedures (including critical care time)  Labs Reviewed  URINALYSIS, ROUTINE W REFLEX MICROSCOPIC - Abnormal; Notable for the following:    Color, Urine RED (*)  BIOCHEMICALS MAY BE AFFECTED BY COLOR   APPearance TURBID (*)     Hgb urine dipstick LARGE (*)     Bilirubin Urine SMALL (*)     Ketones, ur 15 (*)     Protein, ur >300 (*)     Leukocytes, UA LARGE (*)     All other  components within normal limits  URINE CULTURE  URINE MICROSCOPIC-ADD ON  URINE CULTURE   No results found.   1. Cystitis       MDM  Simple cystitis per exam and labs which were reviewed.  Pt was prescribed bactrim, pyridium.  Encouraged to increase fluids.  Urine cx ordered.  Discussed sx that should get rechecked including fever, increased pain,  Vomiting.        Burgess Amor, [place] 11/18/11 [phone]

## 2011-11-19 NOTE — ED Provider Notes (Signed)
Medical screening examination/treatment/procedure(s) were performed by non-physician practitioner and as supervising physician I was immediately available for consultation/collaboration.   Gerhard Munch, MD 11/19/11 [phone]

## 2011-11-23 ENCOUNTER — Encounter (HOSPITAL_COMMUNITY): Payer: Self-pay | Admitting: *Deleted

## 2011-11-23 ENCOUNTER — Emergency Department (HOSPITAL_COMMUNITY)
Admission: EM | Admit: 2011-11-23 | Discharge: 2011-11-23 | Disposition: A | Discharge status: Home / Self Care | Payer: PRIVATE HEALTH INSURANCE | Attending: Emergency Medicine | Admitting: Emergency Medicine

## 2011-11-23 DIAGNOSIS — J029 Acute pharyngitis, unspecified: Secondary | ICD-10-CM | POA: Insufficient documentation

## 2011-11-23 DIAGNOSIS — Z792 Long term (current) use of antibiotics: Secondary | ICD-10-CM | POA: Insufficient documentation

## 2011-11-23 DIAGNOSIS — E119 Type 2 diabetes mellitus without complications: Secondary | ICD-10-CM | POA: Insufficient documentation

## 2011-11-23 DIAGNOSIS — K122 Cellulitis and abscess of mouth: Secondary | ICD-10-CM

## 2011-11-23 DIAGNOSIS — Z79899 Other long term (current) drug therapy: Secondary | ICD-10-CM | POA: Insufficient documentation

## 2011-11-23 DIAGNOSIS — K1370 Unspecified lesions of oral mucosa: Secondary | ICD-10-CM | POA: Insufficient documentation

## 2011-11-23 DIAGNOSIS — F419 Anxiety disorder, unspecified: Secondary | ICD-10-CM

## 2011-11-23 DIAGNOSIS — R51 Headache: Secondary | ICD-10-CM | POA: Insufficient documentation

## 2011-11-23 DIAGNOSIS — F411 Generalized anxiety disorder: Secondary | ICD-10-CM | POA: Insufficient documentation

## 2011-11-23 HISTORY — DX: Type 2 diabetes mellitus without complications: E11.9

## 2011-11-23 MED ORDER — CLONAZEPAM 0.5 MG PO TABS: 0.5000 mg | ORAL_TABLET | Freq: Every day | ORAL | Status: DC

## 2011-11-23 MED ORDER — PENICILLIN V POTASSIUM 500 MG PO TABS: 500.0000 mg | ORAL_TABLET | Freq: Four times a day (QID) | ORAL | Status: AC

## 2011-11-23 NOTE — ED Provider Notes (Signed)
History   This chart was scribed for Flint Melter, MD by Charolett Bumpers . The patient was seen in room TR02C/TR02C. Patient's care was started at 1710.   CSN: [id number]  Arrival date & time 11/23/11  1601   First MD Initiated Contact with Patient 11/23/11 1710      Chief Complaint  Patient presents with  . Mouth Lesions    The history is provided by the patient. No language interpreter was used.  [name] is a 66 y.o. female who presents to the Emergency Department complaining of moderate, multiple mouth sores. She states that she ate a peach 3-4 days ago prior to noticing mouth sores. She reports associated left cheek pain and sore throat. She denies any difficulty eating or drinking but states it's uncomfortable. She also complains of a headache. She denies any fever, vomiting. She states that she also needs her Klonopin refilled. Pt doesn't have a PCP.   Past Medical History  Diagnosis Date  . Diabetes mellitus without complication     History reviewed. No pertinent past surgical history.  No family history on file.  History  Substance Use Topics  . Smoking status: Not on file  . Smokeless tobacco: Not on file  . Alcohol Use: No    OB History    Grav Para Term Preterm Abortions TAB SAB Ect Mult Living                  Review of Systems  Constitutional: Negative for fever and chills.  HENT: Positive for sore throat and mouth sores. Negative for trouble swallowing.        Left cheek pain.   Respiratory: Negative for shortness of breath.   Gastrointestinal: Negative for nausea and vomiting.  Neurological: Positive for headaches. Negative for weakness.    Allergies  Codeine and Ibuprofen  Home Medications   Current Outpatient Rx  Name Route Sig Dispense Refill  . ATENOLOL-CHLORTHALIDONE 50-25 MG PO TABS Oral Take 1 tablet by mouth daily.    Marland Kitchen CLONIDINE HCL 0.2 MG PO TABS Oral Take 0.2 mg by mouth 2 (two) times daily.    Marland Kitchen DILTIAZEM HCL ER  COATED BEADS 360 MG PO CP24 Oral Take 360 mg by mouth daily.    Marland Kitchen EZETIMIBE 10 MG PO TABS Oral Take 10 mg by mouth daily.    . GLYBURIDE-METFORMIN 5-500 MG PO TABS Oral Take 1-2 tablets by mouth daily with breakfast. 2 tabs in the am, 1 tab at bedtime    . LOSARTAN POTASSIUM 100 MG PO TABS Oral Take 100 mg by mouth daily.    Marland Kitchen OMEPRAZOLE 20 MG PO CPDR Oral Take 20 mg by mouth daily.    Marland Kitchen PHENAZOPYRIDINE HCL 200 MG PO TABS Oral Take 1 tablet (200 mg total) by mouth 3 (three) times daily. 6 tablet 0  . SULFAMETHOXAZOLE-TRIMETHOPRIM 800-160 MG PO TABS Oral Take 1 tablet by mouth every 12 (twelve) hours. For 7 days; start date 11/17/11    . CLONAZEPAM 0.5 MG PO TABS Oral Take 1 tablet (0.5 mg total) by mouth daily. 30 tablet 0  . PENICILLIN V POTASSIUM 500 MG PO TABS Oral Take 1 tablet (500 mg total) by mouth 4 (four) times daily. 28 tablet 0    BP 139/70  Pulse 61  Temp 98.5 F (36.9 C) (Oral)  Resp 18  SpO2 99%  Physical Exam  Nursing note and vitals reviewed. Constitutional: She is oriented to person, place, and time. She  appears well-developed and well-nourished. No distress.  HENT:  Head: Normocephalic and atraumatic.       Normal dentition. On the left inferior buccal gutter of mouth an area of whiteness 1 x 0.5 cm with surrounding erythema and tenderness.  Eyes: EOM are normal.  Neck: Neck supple. No tracheal deviation present.  Cardiovascular: Normal rate.   Pulmonary/Chest: Effort normal. No respiratory distress.  Musculoskeletal: Normal range of motion.  Neurological: She is alert and oriented to person, place, and time.  Skin: Skin is warm and dry.  Psychiatric: She has a normal mood and affect. Her behavior is normal.    ED Course  Procedures (including critical care time)  DIAGNOSTIC STUDIES: Oxygen Saturation is 98% on room air, normal by my interpretation.    COORDINATION OF CARE:  17:32-Discussed planned course of treatment with the patient including rinsing with  peroxide/water and treatment with Veetid, who is agreeable at this time. Will prescribe a short course of Klonopin per pt's request.     Labs Reviewed - No data to display No results found.   1. Oral infection   2. Anxiety       MDM  Nonspecific oral lesion, likely, infection, secondary to minor local trauma. Doubt tumor, impacted salivary duct stone, or significant cellulitis, or dental infection. Incidental, anxiety, without Klonopin, to use for it. She is currently has no PCP.    I personally performed the services described in this documentation, which was scribed in my presence. The recorded information has been reviewed and considered.    Plan: Home Medications- Pen VK, Klonapin ; Home Treatments- mouth rinse with peroxide; Recommended follow up- PCP prn   Flint Melter, MD 11/23/11 2104

## 2011-11-23 NOTE — ED Notes (Signed)
The pt is here with a mouth pain and lesion for several days.  She also wants refills for her chronic pain

## 2011-11-23 NOTE — ED Notes (Signed)
The pt thinks she has had the pain from eating a peach 3-4 days ago.

## 2012-06-12 ENCOUNTER — Emergency Department (HOSPITAL_COMMUNITY): Payer: PRIVATE HEALTH INSURANCE

## 2012-06-12 ENCOUNTER — Encounter (HOSPITAL_COMMUNITY): Payer: Self-pay | Admitting: Adult Health

## 2012-06-12 ENCOUNTER — Emergency Department (HOSPITAL_COMMUNITY)
Admission: EM | Admit: 2012-06-12 | Discharge: 2012-06-12 | Disposition: A | Discharge status: Home / Self Care | Payer: PRIVATE HEALTH INSURANCE | Attending: Emergency Medicine | Admitting: Emergency Medicine

## 2012-06-12 DIAGNOSIS — I10 Essential (primary) hypertension: Secondary | ICD-10-CM | POA: Insufficient documentation

## 2012-06-12 DIAGNOSIS — Z79899 Other long term (current) drug therapy: Secondary | ICD-10-CM | POA: Insufficient documentation

## 2012-06-12 DIAGNOSIS — R109 Unspecified abdominal pain: Secondary | ICD-10-CM | POA: Insufficient documentation

## 2012-06-12 DIAGNOSIS — E119 Type 2 diabetes mellitus without complications: Secondary | ICD-10-CM | POA: Insufficient documentation

## 2012-06-12 DIAGNOSIS — K5900 Constipation, unspecified: Secondary | ICD-10-CM | POA: Insufficient documentation

## 2012-06-12 DIAGNOSIS — R300 Dysuria: Secondary | ICD-10-CM | POA: Insufficient documentation

## 2012-06-12 HISTORY — DX: Essential (primary) hypertension: I10

## 2012-06-12 LAB — URINALYSIS, ROUTINE W REFLEX MICROSCOPIC
Bilirubin Urine: NEGATIVE
Glucose, UA: NEGATIVE mg/dL
Hgb urine dipstick: NEGATIVE
Ketones, ur: NEGATIVE mg/dL
Leukocytes, UA: NEGATIVE
Nitrite: NEGATIVE
Protein, ur: NEGATIVE mg/dL
Specific Gravity, Urine: 1.013 (ref 1.005–1.030)
Urobilinogen, UA: 1 mg/dL (ref 0.0–1.0)
pH: 8 (ref 5.0–8.0)

## 2012-06-12 LAB — CBC WITH DIFFERENTIAL/PLATELET
Basophils Absolute: 0 10*3/uL (ref 0.0–0.1)
Basophils Relative: 0 % (ref 0–1)
Eosinophils Absolute: 0.1 10*3/uL (ref 0.0–0.7)
Eosinophils Relative: 1 % (ref 0–5)
HCT: 40.1 % (ref 36.0–46.0)
Hemoglobin: 14.2 g/dL (ref 12.0–15.0)
Lymphocytes Relative: 25 % (ref 12–46)
Lymphs Abs: 2.8 10*3/uL (ref 0.7–4.0)
MCH: 31.1 pg (ref 26.0–34.0)
MCHC: 35.4 g/dL (ref 30.0–36.0)
MCV: 87.9 fL (ref 78.0–100.0)
Monocytes Absolute: 0.9 10*3/uL (ref 0.1–1.0)
Monocytes Relative: 8 % (ref 3–12)
Neutro Abs: 7.2 10*3/uL (ref 1.7–7.7)
Neutrophils Relative %: 66 % (ref 43–77)
Platelets: 273 10*3/uL (ref 150–400)
RBC: 4.56 MIL/uL (ref 3.87–5.11)
RDW: 12.8 % (ref 11.5–15.5)
WBC: 11 10*3/uL — ABNORMAL HIGH (ref 4.0–10.5)

## 2012-06-12 LAB — COMPREHENSIVE METABOLIC PANEL
ALT: 24 U/L (ref 0–35)
AST: 20 U/L (ref 0–37)
Albumin: 3.9 g/dL (ref 3.5–5.2)
Alkaline Phosphatase: 114 U/L (ref 39–117)
BUN: 17 mg/dL (ref 6–23)
CO2: 26 mEq/L (ref 19–32)
Calcium: 9.8 mg/dL (ref 8.4–10.5)
Chloride: 95 mEq/L — ABNORMAL LOW (ref 96–112)
Creatinine, Ser: 1.12 mg/dL — ABNORMAL HIGH (ref 0.50–1.10)
GFR calc Af Amer: 58 mL/min — ABNORMAL LOW (ref >90)
GFR calc non Af Amer: 50 mL/min — ABNORMAL LOW (ref >90)
Glucose, Bld: 86 mg/dL (ref 70–99)
Potassium: 3.4 mEq/L — ABNORMAL LOW (ref 3.5–5.1)
Sodium: 136 mEq/L (ref 135–145)
Total Bilirubin: 0.2 mg/dL — ABNORMAL LOW (ref 0.3–1.2)
Total Protein: 8.6 g/dL — ABNORMAL HIGH (ref 6.0–8.3)

## 2012-06-12 LAB — TROPONIN I: Troponin I: <0.3 ng/mL (ref <0.30)

## 2012-06-12 LAB — LIPASE, BLOOD: Lipase: 83 U/L — ABNORMAL HIGH (ref 11–59)

## 2012-06-12 MED ORDER — FENTANYL CITRATE 0.05 MG/ML IJ SOLN
50.0000 ug | Freq: Once | INTRAMUSCULAR | Status: AC
  Administered 2012-06-12: 50 ug via INTRAVENOUS
  Filled 2012-06-12: qty 2

## 2012-06-12 MED ORDER — POLYETHYLENE GLYCOL 3350 17 GM/SCOOP PO POWD: 17.0000 g | Freq: Two times a day (BID) | ORAL | Status: DC

## 2012-06-12 MED ORDER — IOHEXOL 300 MG/ML  SOLN
100.0000 mL | Freq: Once | INTRAMUSCULAR | Status: AC | PRN
  Administered 2012-06-12: 100 mL via INTRAVENOUS

## 2012-06-12 MED ORDER — SODIUM CHLORIDE 0.9 % IV BOLUS (SEPSIS)
1000.0000 mL | Freq: Once | INTRAVENOUS | Status: AC
  Administered 2012-06-12: 1000 mL via INTRAVENOUS

## 2012-06-12 MED ORDER — IOHEXOL 300 MG/ML  SOLN
50.0000 mL | Freq: Once | INTRAMUSCULAR | Status: AC | PRN
  Administered 2012-06-12: 50 mL via ORAL

## 2012-06-12 MED ORDER — ONDANSETRON HCL 4 MG PO TABS: 4.0000 mg | ORAL_TABLET | Freq: Four times a day (QID) | ORAL | Status: DC

## 2012-06-12 NOTE — ED Notes (Addendum)
Presents with "pain and burning and in my privates and my stomach has a burning bad feeling and I don't have much appetite. I have burning when I do my water. It didn't do that til a week ago and I have been sick ever since" pt is tearful. 'My nose hurts real bad. Its not pain. I don't have pain, but my stomach is painful and my feet and legs have been hurting real bad, especially my right one. I feel like my heart is beating real fast,and I got gas bubbles at night. My stomach is just burning"

## 2012-06-12 NOTE — ED Notes (Signed)
CT notified pt drank first cup of contrast 

## 2012-06-12 NOTE — ED Notes (Signed)
Pt refusing to get in a gown. Pt sts, "Do I need to get in gown? It's just my stomach; why can't the doctor look at my stomach without a gown?" Pt informed that the doctor may need her to change for examination.

## 2012-06-12 NOTE — ED Provider Notes (Signed)
History     CSN: [id number]  Arrival date & time 06/12/12  1520   First MD Initiated Contact with Patient 06/12/12 1628      Chief Complaint  Patient presents with  . Vaginal Pain    (Consider location/radiation/quality/duration/timing/severity/associated sxs/prior treatment) HPI Comments: Pt is a poor historian. 67 y.o. Female with PMHx of DM2, HTN, and SBO presents complaining of vague central upper abdominal pain radiating lower abdominally. Pt has a hard time describing her pain, sometimes stating her stomach is burning, sometimes stating burning in the vaginal area when she urinates. Pt states she has not eaten well over the past week due to these symptoms. Pt has been having regular bowel movements, last one was earlier today. Pt denies nausea, vomiting, diarrhea, constipation, hematuria, vaginal bleeding, or trauma. Denies chest pain, dyspnea.    Past Medical History  Diagnosis Date  . Diabetes mellitus without complication   . Hypertension     History reviewed. No pertinent past surgical history.  History reviewed. No pertinent family history.  History  Substance Use Topics  . Smoking status: Not on file  . Smokeless tobacco: Not on file  . Alcohol Use: No    OB History   Grav Para Term Preterm Abortions TAB SAB Ect Mult Living                  Review of Systems  Constitutional: Positive for appetite change. Negative for fever and diaphoresis.  HENT: Negative for neck pain and neck stiffness.   Respiratory: Negative for shortness of breath.   Cardiovascular: Negative for chest pain.  Gastrointestinal: Positive for abdominal pain. Negative for nausea, vomiting, diarrhea, constipation and blood in stool.       Central upper radiating lower, "burning"  Genitourinary: Positive for dysuria. Negative for frequency, hematuria, flank pain and pelvic pain.  Musculoskeletal: Negative for gait problem.  Skin: Negative for rash.  Neurological: Negative for dizziness,  weakness, light-headedness, numbness and headaches.    Allergies  Codeine and Ibuprofen  Home Medications   Current Outpatient Rx  Name  Route  Sig  Dispense  Refill  . atenolol-chlorthalidone (TENORETIC) 50-25 MG per tablet   Oral   Take 1 tablet by mouth daily.         Marland Kitchen diltiazem (CARDIZEM CD) 360 MG 24 hr capsule   Oral   Take 360 mg by mouth daily.         Marland Kitchen ezetimibe (ZETIA) 10 MG tablet   Oral   Take 10 mg by mouth daily.         Marland Kitchen glyBURIDE-metformin (GLUCOVANCE) 5-500 MG per tablet   Oral   Take 2 tablets by mouth daily with breakfast.          . hydrALAZINE (APRESOLINE) 50 MG tablet   Oral   Take 50 mg by mouth 3 (three) times daily.         Marland Kitchen losartan (COZAAR) 100 MG tablet   Oral   Take 100 mg by mouth daily.           BP 138/65  Pulse 63  Temp(Src) 98.1 F (36.7 C) (Oral)  Resp 16  SpO2 99%  Physical Exam  Nursing note and vitals reviewed. Constitutional: She is oriented to person, place, and time. She appears well-developed and well-nourished. No distress.  HENT:  Head: Normocephalic and atraumatic.  Eyes: Conjunctivae and EOM are normal.  Neck: Normal range of motion. Neck supple.  No meningeal signs  Cardiovascular: Normal  rate, regular rhythm and normal heart sounds.  Exam reveals no gallop and no friction rub.   No murmur heard. Pulmonary/Chest: Effort normal and breath sounds normal. No respiratory distress. She has no wheezes. She has no rales. She exhibits no tenderness.  Abdominal: Soft. Bowel sounds are normal. She exhibits no distension. There is tenderness. There is no rebound and no guarding.  Central, mildly tender to palpation upper and lower abdominally. No radiation to right or left. No pain at McBurney's point, no Rovsign's sign  Musculoskeletal: Normal range of motion. She exhibits no edema and no tenderness.  Neurological: She is alert and oriented to person, place, and time. No cranial nerve deficit.  Skin: Skin  is warm and dry. She is not diaphoretic. No erythema.  Psychiatric:  Anxious, tearful, afraid    ED Course  Procedures (including critical care time)  Labs Reviewed  CBC WITH DIFFERENTIAL - Abnormal; Notable for the following:    WBC 11.0 (*)    All other components within normal limits  COMPREHENSIVE METABOLIC PANEL - Abnormal; Notable for the following:    Potassium 3.4 (*)    Chloride 95 (*)    Creatinine, Ser 1.12 (*)    Total Protein 8.6 (*)    Total Bilirubin 0.2 (*)    GFR calc non Af Amer 50 (*)    GFR calc Af Amer 58 (*)    All other components within normal limits  LIPASE, BLOOD - Abnormal; Notable for the following:    Lipase 83 (*)    All other components within normal limits  URINALYSIS, ROUTINE W REFLEX MICROSCOPIC - Abnormal; Notable for the following:    Color, Urine AMBER (*)    APPearance CLOUDY (*)    All other components within normal limits  TROPONIN I    Date: 06/12/2012  Rate: 67  Rhythm: normal sinus rhythm  QRS Axis: normal  Intervals: normal  ST/T Wave abnormalities: normal  Conduction Disutrbances:none  Narrative Interpretation: normal ekg  Old EKG Reviewed: none available  Medications  iohexol (OMNIPAQUE) 300 MG/ML solution 100 mL (not administered)  sodium chloride 0.9 % bolus 1,000 mL (1,000 mLs Intravenous New Bag/Given 06/12/12 1846)  fentaNYL (SUBLIMAZE) injection 50 mcg (50 mcg Intravenous Given 06/12/12 1846)  iohexol (OMNIPAQUE) 300 MG/ML solution 50 mL (50 mLs Oral Contrast Given 06/12/12 1823)     Ct Abdomen Pelvis W Contrast  06/12/2012   *RADIOLOGY REPORT*  Clinical Data: Abdominal pain.  Leukocytosis.  CT ABDOMEN AND PELVIS WITH CONTRAST  Technique:  Multidetector CT imaging of the abdomen and pelvis was performed following the standard protocol during bolus administration of intravenous contrast.  Contrast: OMNIPAQUE IOHEXOL 300 MG/ML  SOLN  Comparison: None.  Findings: The liver, gallbladder, pancreas, spleen, adrenal  glands, and kidneys are normal in appearance.  No evidence of hydronephrosis.  No soft tissue masses or lymphadenopathy identified within the abdomen or pelvis.  Uterus is surgically absent.  Adnexal regions are unremarkable in appearance.  No evidence of inflammatory process or abnormal fluid collections.  No evidence of bowel wall thickening or obstruction. Large colonic stool burden noted.  No suspicious bone lesions identified peri  IMPRESSION:  1.  No acute findings. 2.  Large colonic stool burden noted;  suggest clinical correlation for possible constipation.   Original Report Authenticated By: Myles Rosenthal, M.D.   New Prescriptions   POLYETHYLENE GLYCOL POWDER (GLYCOLAX/MIRALAX) POWDER    Take 17 g by mouth 2 (two) times daily. Until daily soft stools  OTC     1. Abdominal pain   2. Constipation       MDM  67 y.o. Female with PMHx of SBO, DM2 and HTN presents complaining of vague central upper abdominal pain radiating lower abdominally. PE was unimpressive revealing only mild discomfort on palpation. No peritoneal signs.  IVF and pain meds given. Labs drawn.   Lipase elevated at 83. WBC 11. Other labs unconcerning. Pt case discussed with and seen by Dr. Silverio Lay who, given pt age, poor ability to relay her sx, and hx of SBO, agrees that scan of abd/pelvis is appropriate.   Scan reports no acute findings, with large colonic stool burden noted. Given pt's poor history ability, will prescribe miralax to help pt rid herself of this stool.   Glade Nurse, PA-C 06/12/12 2049

## 2012-06-12 NOTE — ED Notes (Signed)
Pt returned from radiology.

## 2012-06-18 NOTE — ED Provider Notes (Signed)
Medical screening examination/treatment/procedure(s) were performed by non-physician practitioner and as supervising physician I was immediately available for consultation/collaboration.   Richardean Canal, MD 06/18/12 [phone]

## 2012-06-19 ENCOUNTER — Encounter (HOSPITAL_COMMUNITY): Payer: Self-pay | Admitting: *Deleted

## 2012-06-19 DIAGNOSIS — Z7902 Long term (current) use of antithrombotics/antiplatelets: Secondary | ICD-10-CM

## 2012-06-19 DIAGNOSIS — D649 Anemia, unspecified: Secondary | ICD-10-CM | POA: Diagnosis present

## 2012-06-19 DIAGNOSIS — R209 Unspecified disturbances of skin sensation: Secondary | ICD-10-CM | POA: Diagnosis present

## 2012-06-19 DIAGNOSIS — E1142 Type 2 diabetes mellitus with diabetic polyneuropathy: Secondary | ICD-10-CM | POA: Diagnosis present

## 2012-06-19 DIAGNOSIS — I10 Essential (primary) hypertension: Secondary | ICD-10-CM | POA: Diagnosis present

## 2012-06-19 DIAGNOSIS — I6330 Cerebral infarction due to thrombosis of unspecified cerebral artery: Principal | ICD-10-CM | POA: Diagnosis present

## 2012-06-19 DIAGNOSIS — R109 Unspecified abdominal pain: Secondary | ICD-10-CM | POA: Diagnosis present

## 2012-06-19 DIAGNOSIS — G8929 Other chronic pain: Secondary | ICD-10-CM | POA: Diagnosis present

## 2012-06-19 DIAGNOSIS — E1149 Type 2 diabetes mellitus with other diabetic neurological complication: Secondary | ICD-10-CM | POA: Diagnosis present

## 2012-06-19 DIAGNOSIS — K5900 Constipation, unspecified: Secondary | ICD-10-CM | POA: Diagnosis present

## 2012-06-19 DIAGNOSIS — I63239 Cerebral infarction due to unspecified occlusion or stenosis of unspecified carotid arteries: Secondary | ICD-10-CM | POA: Diagnosis present

## 2012-06-19 DIAGNOSIS — I079 Rheumatic tricuspid valve disease, unspecified: Secondary | ICD-10-CM | POA: Diagnosis present

## 2012-06-19 DIAGNOSIS — Z90710 Acquired absence of both cervix and uterus: Secondary | ICD-10-CM

## 2012-06-19 DIAGNOSIS — Z79899 Other long term (current) drug therapy: Secondary | ICD-10-CM

## 2012-06-19 DIAGNOSIS — Z886 Allergy status to analgesic agent status: Secondary | ICD-10-CM

## 2012-06-19 DIAGNOSIS — E876 Hypokalemia: Secondary | ICD-10-CM | POA: Diagnosis present

## 2012-06-19 DIAGNOSIS — Z888 Allergy status to other drugs, medicaments and biological substances status: Secondary | ICD-10-CM

## 2012-06-19 LAB — URINALYSIS, ROUTINE W REFLEX MICROSCOPIC
Bilirubin Urine: NEGATIVE
Glucose, UA: NEGATIVE mg/dL
Hgb urine dipstick: NEGATIVE
Ketones, ur: NEGATIVE mg/dL
Leukocytes, UA: NEGATIVE
Nitrite: NEGATIVE
Protein, ur: NEGATIVE mg/dL
Specific Gravity, Urine: 1.008 (ref 1.005–1.030)
Urobilinogen, UA: 0.2 mg/dL (ref 0.0–1.0)
pH: 7.5 (ref 5.0–8.0)

## 2012-06-19 LAB — COMPREHENSIVE METABOLIC PANEL
ALT: 25 U/L (ref 0–35)
AST: 28 U/L (ref 0–37)
Albumin: 4 g/dL (ref 3.5–5.2)
Alkaline Phosphatase: 103 U/L (ref 39–117)
BUN: 19 mg/dL (ref 6–23)
CO2: 24 mEq/L (ref 19–32)
Calcium: 9.7 mg/dL (ref 8.4–10.5)
Chloride: 95 mEq/L — ABNORMAL LOW (ref 96–112)
Creatinine, Ser: 1.11 mg/dL — ABNORMAL HIGH (ref 0.50–1.10)
GFR calc Af Amer: 59 mL/min — ABNORMAL LOW (ref >90)
GFR calc non Af Amer: 51 mL/min — ABNORMAL LOW (ref >90)
Glucose, Bld: 114 mg/dL — ABNORMAL HIGH (ref 70–99)
Potassium: 3.2 mEq/L — ABNORMAL LOW (ref 3.5–5.1)
Sodium: 136 mEq/L (ref 135–145)
Total Bilirubin: 0.2 mg/dL — ABNORMAL LOW (ref 0.3–1.2)
Total Protein: 8.3 g/dL (ref 6.0–8.3)

## 2012-06-19 LAB — CBC WITH DIFFERENTIAL/PLATELET
Basophils Absolute: 0 10*3/uL (ref 0.0–0.1)
Basophils Relative: 0 % (ref 0–1)
Eosinophils Absolute: 0 10*3/uL (ref 0.0–0.7)
Eosinophils Relative: 0 % (ref 0–5)
HCT: 38.1 % (ref 36.0–46.0)
Hemoglobin: 13 g/dL (ref 12.0–15.0)
Lymphocytes Relative: 18 % (ref 12–46)
Lymphs Abs: 2 10*3/uL (ref 0.7–4.0)
MCH: 30.4 pg (ref 26.0–34.0)
MCHC: 34.1 g/dL (ref 30.0–36.0)
MCV: 89 fL (ref 78.0–100.0)
Monocytes Absolute: 0.6 10*3/uL (ref 0.1–1.0)
Monocytes Relative: 5 % (ref 3–12)
Neutro Abs: 8.5 10*3/uL — ABNORMAL HIGH (ref 1.7–7.7)
Neutrophils Relative %: 76 % (ref 43–77)
Platelets: 243 10*3/uL (ref 150–400)
RBC: 4.28 MIL/uL (ref 3.87–5.11)
RDW: 13.2 % (ref 11.5–15.5)
WBC: 11.2 10*3/uL — ABNORMAL HIGH (ref 4.0–10.5)

## 2012-06-19 LAB — POCT I-STAT TROPONIN I: Troponin i, poc: 0 ng/mL (ref 0.00–0.08)

## 2012-06-19 MED ORDER — ONDANSETRON 4 MG PO TBDP
8.0000 mg | ORAL_TABLET | Freq: Once | ORAL | Status: AC
  Administered 2012-06-20: 8 mg via ORAL
  Filled 2012-06-19: qty 2

## 2012-06-19 NOTE — ED Notes (Addendum)
Here last 5/22 for stomach pains - "gas bubbles, and burning." still having belly pain and new onset of lt arm and lt. Leg arm numbness. Poor appetite today. Blood sugar 91.

## 2012-06-19 NOTE — ED Notes (Signed)
NURSE FIRST ROUNDS : NURSE EXPLAINED DELAY , WAITING TIME AND PROCESS , RESPIRATIONS UNLABORED , NO NAUSEA / DENIES PAIN AT THIS TIME .

## 2012-06-20 ENCOUNTER — Observation Stay (HOSPITAL_COMMUNITY): Payer: PRIVATE HEALTH INSURANCE

## 2012-06-20 ENCOUNTER — Emergency Department (HOSPITAL_COMMUNITY): Payer: PRIVATE HEALTH INSURANCE

## 2012-06-20 ENCOUNTER — Encounter (HOSPITAL_COMMUNITY): Payer: Self-pay | Admitting: Emergency Medicine

## 2012-06-20 ENCOUNTER — Inpatient Hospital Stay (HOSPITAL_COMMUNITY)
Admission: EM | Admit: 2012-06-20 | Discharge: 2012-06-23 | DRG: 064 | Disposition: A | Discharge status: Rehabilitation Facility | Payer: PRIVATE HEALTH INSURANCE | Attending: Internal Medicine | Admitting: Internal Medicine

## 2012-06-20 DIAGNOSIS — I639 Cerebral infarction, unspecified: Secondary | ICD-10-CM | POA: Diagnosis present

## 2012-06-20 DIAGNOSIS — F329 Major depressive disorder, single episode, unspecified: Secondary | ICD-10-CM

## 2012-06-20 DIAGNOSIS — K219 Gastro-esophageal reflux disease without esophagitis: Secondary | ICD-10-CM

## 2012-06-20 DIAGNOSIS — M2550 Pain in unspecified joint: Secondary | ICD-10-CM

## 2012-06-20 DIAGNOSIS — R200 Anesthesia of skin: Secondary | ICD-10-CM | POA: Diagnosis present

## 2012-06-20 DIAGNOSIS — R079 Chest pain, unspecified: Secondary | ICD-10-CM

## 2012-06-20 DIAGNOSIS — G459 Transient cerebral ischemic attack, unspecified: Secondary | ICD-10-CM

## 2012-06-20 DIAGNOSIS — I10 Essential (primary) hypertension: Secondary | ICD-10-CM | POA: Diagnosis present

## 2012-06-20 DIAGNOSIS — Z8673 Personal history of transient ischemic attack (TIA), and cerebral infarction without residual deficits: Secondary | ICD-10-CM | POA: Diagnosis present

## 2012-06-20 DIAGNOSIS — R209 Unspecified disturbances of skin sensation: Secondary | ICD-10-CM

## 2012-06-20 DIAGNOSIS — J069 Acute upper respiratory infection, unspecified: Secondary | ICD-10-CM

## 2012-06-20 DIAGNOSIS — E1169 Type 2 diabetes mellitus with other specified complication: Secondary | ICD-10-CM | POA: Diagnosis present

## 2012-06-20 DIAGNOSIS — M129 Arthropathy, unspecified: Secondary | ICD-10-CM

## 2012-06-20 DIAGNOSIS — M545 Low back pain, unspecified: Secondary | ICD-10-CM

## 2012-06-20 DIAGNOSIS — E876 Hypokalemia: Secondary | ICD-10-CM | POA: Diagnosis present

## 2012-06-20 DIAGNOSIS — I369 Nonrheumatic tricuspid valve disorder, unspecified: Secondary | ICD-10-CM

## 2012-06-20 DIAGNOSIS — E119 Type 2 diabetes mellitus without complications: Secondary | ICD-10-CM | POA: Diagnosis present

## 2012-06-20 LAB — BASIC METABOLIC PANEL
BUN: 18 mg/dL (ref 6–23)
CO2: 28 mEq/L (ref 19–32)
Calcium: 8.9 mg/dL (ref 8.4–10.5)
Chloride: 98 mEq/L (ref 96–112)
Creatinine, Ser: 1.15 mg/dL — ABNORMAL HIGH (ref 0.50–1.10)
GFR calc Af Amer: 57 mL/min — ABNORMAL LOW (ref >90)
GFR calc non Af Amer: 49 mL/min — ABNORMAL LOW (ref >90)
Glucose, Bld: 130 mg/dL — ABNORMAL HIGH (ref 70–99)
Potassium: 3.2 mEq/L — ABNORMAL LOW (ref 3.5–5.1)
Sodium: 137 mEq/L (ref 135–145)

## 2012-06-20 LAB — CBC
HCT: 33.4 % — ABNORMAL LOW (ref 36.0–46.0)
Hemoglobin: 11.7 g/dL — ABNORMAL LOW (ref 12.0–15.0)
MCH: 31 pg (ref 26.0–34.0)
MCHC: 35 g/dL (ref 30.0–36.0)
MCV: 88.4 fL (ref 78.0–100.0)
Platelets: 219 10*3/uL (ref 150–400)
RBC: 3.78 MIL/uL — ABNORMAL LOW (ref 3.87–5.11)
RDW: 13.2 % (ref 11.5–15.5)
WBC: 11 10*3/uL — ABNORMAL HIGH (ref 4.0–10.5)

## 2012-06-20 LAB — OCCULT BLOOD, POC DEVICE: Fecal Occult Bld: POSITIVE — AB

## 2012-06-20 LAB — LIPASE, BLOOD: Lipase: 52 U/L (ref 11–59)

## 2012-06-20 LAB — GLUCOSE, CAPILLARY
Glucose-Capillary: 123 mg/dL — ABNORMAL HIGH (ref 70–99)
Glucose-Capillary: 110 mg/dL — ABNORMAL HIGH (ref 70–99)
Glucose-Capillary: 143 mg/dL — ABNORMAL HIGH (ref 70–99)
Glucose-Capillary: 168 mg/dL — ABNORMAL HIGH (ref 70–99)

## 2012-06-20 LAB — HEMOGLOBIN A1C
Hgb A1c MFr Bld: 7.2 % — ABNORMAL HIGH (ref <5.7)
Mean Plasma Glucose: 160 mg/dL — ABNORMAL HIGH (ref <117)

## 2012-06-20 MED ORDER — HYDRALAZINE HCL 50 MG PO TABS
100.0000 mg | ORAL_TABLET | Freq: Three times a day (TID) | ORAL | Status: DC
  Administered 2012-06-20 – 2012-06-23 (×8): 100 mg via ORAL
  Filled 2012-06-20 (×13): qty 2

## 2012-06-20 MED ORDER — HYDROCODONE-ACETAMINOPHEN 5-325 MG PO TABS
1.0000 | ORAL_TABLET | Freq: Four times a day (QID) | ORAL | Status: DC | PRN
  Administered 2012-06-20 – 2012-06-23 (×8): 1 via ORAL
  Filled 2012-06-20 (×8): qty 1

## 2012-06-20 MED ORDER — HYDRALAZINE HCL 100 MG PO TABS: 100.0000 mg | ORAL_TABLET | Freq: Three times a day (TID) | ORAL | Status: DC

## 2012-06-20 MED ORDER — ATENOLOL 50 MG PO TABS
50.0000 mg | ORAL_TABLET | Freq: Every day | ORAL | Status: DC
  Administered 2012-06-20 – 2012-06-23 (×4): 50 mg via ORAL
  Filled 2012-06-20 (×4): qty 1

## 2012-06-20 MED ORDER — POTASSIUM CHLORIDE CRYS ER 20 MEQ PO TBCR
40.0000 meq | EXTENDED_RELEASE_TABLET | Freq: Once | ORAL | Status: AC
  Administered 2012-06-20: 40 meq via ORAL
  Filled 2012-06-20: qty 2

## 2012-06-20 MED ORDER — LOSARTAN POTASSIUM 50 MG PO TABS
100.0000 mg | ORAL_TABLET | Freq: Every day | ORAL | Status: DC
  Administered 2012-06-20 – 2012-06-23 (×4): 100 mg via ORAL
  Filled 2012-06-20 (×4): qty 2

## 2012-06-20 MED ORDER — EZETIMIBE 10 MG PO TABS
10.0000 mg | ORAL_TABLET | Freq: Every day | ORAL | Status: DC
  Administered 2012-06-20 – 2012-06-23 (×4): 10 mg via ORAL
  Filled 2012-06-20 (×4): qty 1

## 2012-06-20 MED ORDER — POLYETHYLENE GLYCOL 3350 17 GM/SCOOP PO POWD: 17.0000 g | Freq: Two times a day (BID) | ORAL | Status: DC

## 2012-06-20 MED ORDER — ATENOLOL-CHLORTHALIDONE 50-25 MG PO TABS
1.0000 | ORAL_TABLET | Freq: Every day | ORAL | Status: DC
Start: 2012-06-20 — End: 2012-06-20

## 2012-06-20 MED ORDER — CHLORTHALIDONE 25 MG PO TABS
25.0000 mg | ORAL_TABLET | Freq: Every day | ORAL | Status: DC
  Administered 2012-06-20 – 2012-06-23 (×4): 25 mg via ORAL
  Filled 2012-06-20 (×4): qty 1

## 2012-06-20 MED ORDER — DILTIAZEM HCL ER COATED BEADS 360 MG PO CP24
360.0000 mg | ORAL_CAPSULE | Freq: Every day | ORAL | Status: DC
  Administered 2012-06-20 – 2012-06-23 (×4): 360 mg via ORAL
  Filled 2012-06-20 (×4): qty 1

## 2012-06-20 MED ORDER — SODIUM CHLORIDE 0.9 % IJ SOLN
3.0000 mL | Freq: Two times a day (BID) | INTRAMUSCULAR | Status: DC
  Administered 2012-06-20 – 2012-06-23 (×7): 3 mL via INTRAVENOUS

## 2012-06-20 MED ORDER — POLYETHYLENE GLYCOL 3350 17 G PO PACK
17.0000 g | PACK | Freq: Two times a day (BID) | ORAL | Status: DC
  Administered 2012-06-20 – 2012-06-22 (×4): 17 g via ORAL
  Filled 2012-06-20 (×8): qty 1

## 2012-06-20 MED ORDER — CLOPIDOGREL BISULFATE 75 MG PO TABS
75.0000 mg | ORAL_TABLET | Freq: Every day | ORAL | Status: DC
  Administered 2012-06-20 – 2012-06-23 (×4): 75 mg via ORAL
  Filled 2012-06-20 (×5): qty 1

## 2012-06-20 MED ORDER — LORAZEPAM 0.5 MG PO TABS
0.5000 mg | ORAL_TABLET | Freq: Once | ORAL | Status: AC
  Administered 2012-06-21: 0.5 mg via ORAL
  Filled 2012-06-20: qty 1

## 2012-06-20 MED ORDER — FENTANYL CITRATE 0.05 MG/ML IJ SOLN
50.0000 ug | Freq: Once | INTRAMUSCULAR | Status: AC
  Administered 2012-06-20: 50 ug via INTRAVENOUS
  Filled 2012-06-20: qty 2

## 2012-06-20 MED ORDER — INSULIN ASPART 100 UNIT/ML ~~LOC~~ SOLN
0.0000 [IU] | Freq: Three times a day (TID) | SUBCUTANEOUS | Status: DC
  Administered 2012-06-20 (×2): 2 [IU] via SUBCUTANEOUS
  Administered 2012-06-21: 3 [IU] via SUBCUTANEOUS
  Administered 2012-06-21: 2 [IU] via SUBCUTANEOUS
  Administered 2012-06-21: 3 [IU] via SUBCUTANEOUS
  Administered 2012-06-22 (×2): 2 [IU] via SUBCUTANEOUS
  Administered 2012-06-22: 3 [IU] via SUBCUTANEOUS
  Administered 2012-06-23: 5 [IU] via SUBCUTANEOUS
  Administered 2012-06-23: 2 [IU] via SUBCUTANEOUS

## 2012-06-20 MED ORDER — HYDROMORPHONE HCL PF 1 MG/ML IJ SOLN
0.5000 mg | INTRAMUSCULAR | Status: DC | PRN
  Administered 2012-06-20: 0.5 mg via INTRAVENOUS
  Filled 2012-06-20: qty 1

## 2012-06-20 MED ORDER — SODIUM CHLORIDE 0.9 % IV BOLUS (SEPSIS)
500.0000 mL | Freq: Once | INTRAVENOUS | Status: AC
  Administered 2012-06-20: 500 mL via INTRAVENOUS

## 2012-06-20 MED ORDER — ONDANSETRON HCL 4 MG PO TABS
4.0000 mg | ORAL_TABLET | Freq: Four times a day (QID) | ORAL | Status: DC
  Administered 2012-06-20 – 2012-06-23 (×15): 4 mg via ORAL
  Filled 2012-06-20 (×18): qty 1

## 2012-06-20 NOTE — Progress Notes (Signed)
[facility] PROGRESS NOTE  [name] YQM:[id number] DOB: 01-15-1946 DOA: 06/20/2012 PCP: Dayna Barker  Brief narrative 67 y.o. female with h/o HTN, chronic abdominal pain, DM2, who presents to the ED. While in the ED being worked up for her chronic abdominal pain (work up was unremarkable) the patient also gives a history of 1 day onset of numbness in her L arm, L leg, and R leg. She states there is no associated weakness. Symptoms have been persistent.  CT head in the ED was negative, neurology consulted and recommended admission to rule out CVA.  Assessment/Plan: 1. Acute right brain stroke (posterior circulation): Neurology consultation appreciated. Complete stroke workup-2-D echo, carotid Dopplers, lipid panel, PT and OT evaluation. Continue Plavix for secondary stroke prevention. MRA brain: Negative. Carotid Dopplers: Right-40-59% ICA stenosis. Left no hemodynamically significant ICA stenosis. 2. Diabetes mellitus type 2: Reasonably controlled inpatient. Check A1c. 3. Hypertension: Allow for permissive hypertension. Continue losartan, chlorthalidone, atenolol, Cardizem and hydralazine. 4. Hypokalemia: Replete and follow BMP. 5. Anemia: Possibly chronic. Follow CBC in a.m.   Code Status: Full Family Communication: Discussed with patient's 2 daughters and a granddaughter at bedside. Disposition Plan: Home when medically stable.   Consultants:  Neurology  Procedures:  None  Antibiotics:  None   HPI/Subjective: Left upper extremity numbness, intermittent left-sided face numbness and numbness of both legs below knees. Does not complain of abdominal pain at this time.  Objective: Filed Vitals:   06/20/12 0430 06/20/12 0538 06/20/12 0539 06/20/12 1322  BP: 155/54 170/59  129/48  Pulse: 69 80  72  Temp:  98.8 F (37.1 C)  98.8 F (37.1 C)  TempSrc:  Oral  Oral  Resp:  18  26  Height:   5\' 4"  (1.626 m)   Weight:   70.1 kg (154 lb 8.7 oz)   SpO2: 98% 100%   97%   No intake or output data in the 24 hours ending 06/20/12 1615 Filed Weights   06/20/12 0539  Weight: 70.1 kg (154 lb 8.7 oz)    Exam:   General exam: Comfortable.  Respiratory system: Clear. No increased work of breathing.  Cardiovascular system: S1 & S2 heard, RRR. No JVD, murmurs, gallops, clicks or pedal edema. Telemetry: Sinus rhythm  Gastrointestinal system: Abdomen is nondistended, soft and nontender. Normal bowel sounds heard.  Central nervous system: Alert and oriented. No focal neurological deficits.  Extremities: Symmetric 5 x 5 power.   Data Reviewed: Basic Metabolic Panel:  Recent Labs Lab 06/19/12 1913 06/20/12 0445  NA 136 137  K 3.2* 3.2*  CL 95* 98  CO2 24 28  GLUCOSE 114* 130*  BUN 19 18  CREATININE 1.11* 1.15*  CALCIUM 9.7 8.9   Liver Function Tests:  Recent Labs Lab 06/19/12 1913  AST 28  ALT 25  ALKPHOS 103  BILITOT 0.2*  PROT 8.3  ALBUMIN 4.0    Recent Labs Lab 06/19/12 1913  LIPASE 52   No results found for this basename: AMMONIA,  in the last 168 hours CBC:  Recent Labs Lab 06/19/12 1913 06/20/12 0445  WBC 11.2* 11.0*  NEUTROABS 8.5*  --   HGB 13.0 11.7*  HCT 38.1 33.4*  MCV 89.0 88.4  PLT 243 219   Cardiac Enzymes: No results found for this basename: CKTOTAL, CKMB, CKMBINDEX, TROPONINI,  in the last 168 hours BNP (last 3 results) No results found for this basename: PROBNP,  in the last 8760 hours CBG:  Recent Labs Lab 06/20/12 0650 06/20/12 1140  GLUCAP  123* 110*    No results found for this or any previous visit (from the past 240 hour(s)).   Studies: Ct Head Wo Contrast  06/20/2012   *RADIOLOGY REPORT*  Clinical Data: New onset of left arm and left leg numbness.  CT HEAD WITHOUT CONTRAST  Technique:  Contiguous axial images were obtained from the base of the skull through the vertex without contrast.  Comparison: None.  Findings: There is no evidence of acute infarction, mass lesion, or intra- or  extra-axial hemorrhage on CT.  The posterior fossa, including the cerebellum, brainstem and fourth ventricle, is within normal limits.  The third and lateral ventricles, and basal ganglia are unremarkable in appearance.  The cerebral hemispheres are symmetric in appearance, with normal gray- white differentiation.  No mass effect or midline shift is seen.  There is no evidence of fracture; visualized osseous structures are unremarkable in appearance.  The visualized portions of the orbits are within normal limits.  The paranasal sinuses and mastoid air cells are well-aerated.  Cerumen is noted partially filling the external auditory canals bilaterally.  IMPRESSION:  1.  No acute intracranial pathology seen on CT. 2.  Cerumen noted partially filling the external auditory canals bilaterally.   Original Report Authenticated By: Tonia Ghent, M.D.   Mr [facility] Wo Contrast  06/20/2012   *RADIOLOGY REPORT*  Clinical Data:  CVA.  Diabetes and hypertension  MRI HEAD WITHOUT CONTRAST MRA HEAD WITHOUT CONTRAST  Technique:  Multiplanar, multiecho pulse sequences of the brain and surrounding structures were obtained without intravenous contrast. Angiographic images of the head were obtained using MRA technique without contrast.  Comparison:  CT head 06/20/2012  MRI HEAD  Findings:  Small focus of acute infarction in the splenium of the corpus callosum in the midline.  No other acute infarct.  Mild hyperintensity in the periventricular white matter, consistent with very mild chronic microvascular ischemia.  No cortical infarct.  Brainstem and cerebellum are intact.  Negative for intracranial hemorrhage.  No mass or edema.  Ventricle size is normal.  IMPRESSION: Small focus of acute infarction splenium of the corpus callosum. Mild chronic microvascular ischemic change in the white matter.  MRA HEAD  Findings: Both vertebral arteries are patent to the basilar.  Right PICA is patent.  Left PICA not visualized.  AICA, superior  cerebellar, and posterior cerebral arteries are widely patent.  The basilar is widely patent.  The internal carotid artery is widely patent.  The anterior and middle cerebral arteries are widely patent without stenosis.  Negative for cerebral aneurysm.  IMPRESSION: Negative   Original Report Authenticated By: Janeece Riggers, M.D.   Mr Brain Wo Contrast  06/20/2012   *RADIOLOGY REPORT*  Clinical Data:  CVA.  Diabetes and hypertension  MRI HEAD WITHOUT CONTRAST MRA HEAD WITHOUT CONTRAST  Technique:  Multiplanar, multiecho pulse sequences of the brain and surrounding structures were obtained without intravenous contrast. Angiographic images of the head were obtained using MRA technique without contrast.  Comparison:  CT head 06/20/2012  MRI HEAD  Findings:  Small focus of acute infarction in the splenium of the corpus callosum in the midline.  No other acute infarct.  Mild hyperintensity in the periventricular white matter, consistent with very mild chronic microvascular ischemia.  No cortical infarct.  Brainstem and cerebellum are intact.  Negative for intracranial hemorrhage.  No mass or edema.  Ventricle size is normal.  IMPRESSION: Small focus of acute infarction splenium of the corpus callosum. Mild chronic microvascular ischemic change in  the white matter.  MRA HEAD  Findings: Both vertebral arteries are patent to the basilar.  Right PICA is patent.  Left PICA not visualized.  AICA, superior cerebellar, and posterior cerebral arteries are widely patent.  The basilar is widely patent.  The internal carotid artery is widely patent.  The anterior and middle cerebral arteries are widely patent without stenosis.  Negative for cerebral aneurysm.  IMPRESSION: Negative   Original Report Authenticated By: Janeece Riggers, M.D.   Dg Abd Acute W/chest  06/20/2012   *RADIOLOGY REPORT*  Clinical Data: Abdominal pain; left arm and leg numbness.  Poor appetite.  ACUTE ABDOMEN SERIES (ABDOMEN 2 VIEW & CHEST 1 VIEW)  Comparison:  Chest and abdominal radiographs performed 07/09/2009, and CT of the abdomen and pelvis performed 06/10/2012  Findings: The lungs are well-aerated and clear.  There is no evidence of focal opacification, pleural effusion or pneumothorax. The cardiomediastinal silhouette is within normal limits.  The visualized bowel gas pattern is unremarkable.  Scattered stool and air are seen within the colon; there is no evidence of small bowel dilatation to suggest obstruction.  No free intra-abdominal air is identified on the provided upright view.  No acute osseous abnormalities are seen; the sacroiliac joints are unremarkable in appearance.  Mild degenerative change is noted at the lower lumbar spine.  IMPRESSION:  1.  Unremarkable bowel gas pattern; no free intra-abdominal air seen. 2.  No acute cardiopulmonary process identified.   Original Report Authenticated By: Tonia Ghent, M.D.     Additional labs:   Scheduled Meds: . atenolol  50 mg Oral Daily  . chlorthalidone  25 mg Oral Daily  . clopidogrel  75 mg Oral Q breakfast  . diltiazem  360 mg Oral Daily  . ezetimibe  10 mg Oral Daily  . hydrALAZINE  100 mg Oral Q8H  . insulin aspart  0-15 Units Subcutaneous TID WC  . losartan  100 mg Oral Daily  . ondansetron  4 mg Oral Q6H  . polyethylene glycol  17 g Oral BID  . sodium chloride  3 mL Intravenous Q12H   Continuous Infusions:   Principal Problem:   Numbness Active Problems:   HYPERTENSION   Diabetes mellitus    Time spent: 35 minutes    [facility]  [facility] Pager [phone].   If 8PM-8AM, please contact night-coverage at [email or web address], password [facility] 06/20/2012, 4:15 PM  LOS: 0 days

## 2012-06-20 NOTE — Progress Notes (Signed)
Pt came from  The ED to 4n27, pt is alet x4, placed on tele. Will cont. To monitor pt.----Loren Sawaya, rn

## 2012-06-20 NOTE — ED Notes (Signed)
Attempted report 

## 2012-06-20 NOTE — H&P (Addendum)
[facility] History and Physical  [name] ZOX:[id number] DOB: 12-17-1945 DOA: 06/20/2012  Referring physician: ED PCP: Dayna Barker  Specialists: None  Chief Complaint: L sided numbness  HPI: [name] is a 67 y.o. female with h/o HTN, chronic abdominal pain, DM2, who presents to the ED.  While in the ED being worked up for her chronic abdominal pain (work up was unremarkable) the patient also gives a history of 1 day onset of numbness in her L arm, L leg, and R leg.  She states there is no associated weakness.  Symptoms have been persistent.  CT head in the ED was negative, but Dr. Roseanne Reno just to be on the safe side would like an MRI brain to rule out TIA/CVA as the patient does have significant risk factors, hospitalist has been asked to admit.  Review of Systems: 12 systems reviewed and otherwise negative.   Past Medical History  Diagnosis Date  . Diabetes mellitus without complication   . Hypertension    History reviewed. No pertinent past surgical history. Social History:  reports that she does not drink alcohol or use illicit drugs. Her tobacco history is not on file.   Allergies  Allergen Reactions  . Codeine     REACTION: Hallucinations  . Ibuprofen     REACTION: Elevated Blood Pressure    History reviewed. No pertinent family history.  Prior to Admission medications   Medication Sig Start Date End Date Taking? Authorizing Provider  atenolol-chlorthalidone (TENORETIC) 50-25 MG per tablet Take 1 tablet by mouth daily.   Yes Historical Provider, MD  diltiazem (CARDIZEM CD) 360 MG 24 hr capsule Take 360 mg by mouth daily.   Yes Historical Provider, MD  ezetimibe (ZETIA) 10 MG tablet Take 10 mg by mouth daily.   Yes Historical Provider, MD  glipiZIDE-metformin (METAGLIP) 5-500 MG per tablet Take 1-2 tablets by mouth 2 (two) times daily before a meal. Takes 2 tablets in the morning and 1 tablet at bedtime   Yes Historical Provider, MD  hydrALAZINE  (APRESOLINE) 100 MG tablet Take 100 mg by mouth 3 (three) times daily.   Yes Historical Provider, MD  losartan (COZAAR) 100 MG tablet Take 100 mg by mouth daily.   Yes Historical Provider, MD  ondansetron (ZOFRAN) 4 MG tablet Take 1 tablet (4 mg total) by mouth every 6 (six) hours. 06/12/12  Yes Glade Nurse, PA-C  polyethylene glycol powder (GLYCOLAX/MIRALAX) powder Take 17 g by mouth 2 (two) times daily. Until daily soft stools  OTC 06/12/12  Yes Glade Nurse, PA-C   Physical Exam: Filed Vitals:   06/20/12 0047 06/20/12 0100 06/20/12 0305 06/20/12 0307  BP: 148/57 157/53 159/65   Pulse: 74 78  78  Temp:      TempSrc:      Resp:      SpO2: 100% 100%  96%    General:  NAD, resting comfortably in bed Eyes: PEERLA EOMI ENT: mucous membranes moist Neck: supple w/o JVD Cardiovascular: RRR w/o MRG Respiratory: CTA B Abdomen: soft, nt, nd, bs+ Skin: no rash nor lesion Musculoskeletal: MAE, full ROM all 4 extremities Psychiatric: normal tone and affect Neurologic: AAOx3, grossly non-focal  Labs on Admission:  Basic Metabolic Panel:  Recent Labs Lab 06/19/12 1913  NA 136  K 3.2*  CL 95*  CO2 24  GLUCOSE 114*  BUN 19  CREATININE 1.11*  CALCIUM 9.7   Liver Function Tests:  Recent Labs Lab 06/19/12 1913  AST 28  ALT 25  ALKPHOS 103  BILITOT 0.2*  PROT 8.3  ALBUMIN 4.0    Recent Labs Lab 06/19/12 1913  LIPASE 52   No results found for this basename: AMMONIA,  in the last 168 hours CBC:  Recent Labs Lab 06/19/12 1913  WBC 11.2*  NEUTROABS 8.5*  HGB 13.0  HCT 38.1  MCV 89.0  PLT 243   Cardiac Enzymes: No results found for this basename: CKTOTAL, CKMB, CKMBINDEX, TROPONINI,  in the last 168 hours  BNP (last 3 results) No results found for this basename: PROBNP,  in the last 8760 hours CBG: No results found for this basename: GLUCAP,  in the last 168 hours  Radiological Exams on Admission: Ct Head Wo Contrast  06/20/2012   *RADIOLOGY REPORT*   Clinical Data: New onset of left arm and left leg numbness.  CT HEAD WITHOUT CONTRAST  Technique:  Contiguous axial images were obtained from the base of the skull through the vertex without contrast.  Comparison: None.  Findings: There is no evidence of acute infarction, mass lesion, or intra- or extra-axial hemorrhage on CT.  The posterior fossa, including the cerebellum, brainstem and fourth ventricle, is within normal limits.  The third and lateral ventricles, and basal ganglia are unremarkable in appearance.  The cerebral hemispheres are symmetric in appearance, with normal gray- white differentiation.  No mass effect or midline shift is seen.  There is no evidence of fracture; visualized osseous structures are unremarkable in appearance.  The visualized portions of the orbits are within normal limits.  The paranasal sinuses and mastoid air cells are well-aerated.  Cerumen is noted partially filling the external auditory canals bilaterally.  IMPRESSION:  1.  No acute intracranial pathology seen on CT. 2.  Cerumen noted partially filling the external auditory canals bilaterally.   Original Report Authenticated By: Tonia Ghent, M.D.   Dg Abd Acute W/chest  06/20/2012   *RADIOLOGY REPORT*  Clinical Data: Abdominal pain; left arm and leg numbness.  Poor appetite.  ACUTE ABDOMEN SERIES (ABDOMEN 2 VIEW & CHEST 1 VIEW)  Comparison: Chest and abdominal radiographs performed 07/09/2009, and CT of the abdomen and pelvis performed 06/10/2012  Findings: The lungs are well-aerated and clear.  There is no evidence of focal opacification, pleural effusion or pneumothorax. The cardiomediastinal silhouette is within normal limits.  The visualized bowel gas pattern is unremarkable.  Scattered stool and air are seen within the colon; there is no evidence of small bowel dilatation to suggest obstruction.  No free intra-abdominal air is identified on the provided upright view.  No acute osseous abnormalities are seen; the  sacroiliac joints are unremarkable in appearance.  Mild degenerative change is noted at the lower lumbar spine.  IMPRESSION:  1.  Unremarkable bowel gas pattern; no free intra-abdominal air seen. 2.  No acute cardiopulmonary process identified.   Original Report Authenticated By: Tonia Ghent, M.D.    EKG: Independently reviewed.  Assessment/Plan Principal Problem:   Numbness Active Problems:   HYPERTENSION   Diabetes mellitus   1. Numbness - Has risk factors of stroke but given BLE etiology stroke unlikely but admitting for MRI to be safe since the patient does have risk factors.  Tele monitor.  ASA 81.  If positive then needs full stroke work up, if negative may be discharged. 2. DM2 - hold home meds and put on SSI while inpatient. 3. HTN - continue home meds  Dr. Roseanne Reno has seen patient in consult.  Code Status: Full Code (must indicate code status--if unknown  or must be presumed, indicate so) Family Communication: Spoke with family in room (indicate person spoken with, if applicable, with phone number if by telephone) Disposition Plan: Admit to obs (indicate anticipated LOS)  Time spent: 50 min  Kathaleya Mcduffee M. [facility] Pager [phone]  If 7PM-7AM, please contact night-coverage [email or web address] Password [email or web address] 06/20/2012, 3:26 AM

## 2012-06-20 NOTE — Progress Notes (Signed)
VASCULAR LAB PRELIMINARY  PRELIMINARY  PRELIMINARY  PRELIMINARY  Carotid duplex  completed.    Preliminary report:  Right:  40-59% internal carotid artery stenosis by velocity.  Increased velocities may be due to tortuosity.  Left:  No evidence of hemodynamically significant internal carotid artery stenosis.  Bilateral:  Vertebral artery flow is antegrade.     Xadrian Craighead, RVT 06/20/2012, 1:59 PM

## 2012-06-20 NOTE — Progress Notes (Signed)
  Echocardiogram 2D Echocardiogram has been performed.  Cathie Beams 06/20/2012, 4:22 PM

## 2012-06-20 NOTE — Progress Notes (Signed)
Nutrition Brief Note  Patient identified on the Malnutrition Screening Tool (MST) Report  Pt discussed during rounds and with RN. Per daughters pt has had no recent weight changes and good appetite. Per MD note pt may have had migraine but stroke work up in progress.   Body mass index is 26.51 kg/(m^2). Patient meets criteria for Overweight based on current BMI.   Current diet order is CHO Modified, patient is consuming approximately 75% of meals at this time. Labs and medications reviewed.   No nutrition interventions warranted at this time. If nutrition issues arise, please consult RD.   Kendell Bane RD, LDN, CNSC [phone] Pager [phone] After Hours Pager

## 2012-06-20 NOTE — ED Provider Notes (Signed)
History     CSN: [id number]  Arrival date & time 06/19/12  1850   First MD Initiated Contact with Patient 06/20/12 0008      Chief Complaint  Patient presents with  . Abdominal Pain  . Numbness    (Consider location/radiation/quality/duration/timing/severity/associated sxs/prior treatment) Patient is a 67 y.o. female presenting with abdominal pain and cramps. The history is provided by the patient.  Abdominal Pain This is a chronic problem. The current episode started more than 1 week ago. The problem occurs constantly. The problem has not changed since onset.Associated symptoms include abdominal pain. Pertinent negatives include no chest pain, no headaches and no shortness of breath. Nothing aggravates the symptoms. Nothing relieves the symptoms. She has tried nothing for the symptoms. The treatment provided no relief.  Abdominal Cramping This is a new problem. The current episode started more than 2 days ago. The problem occurs constantly. The problem has not changed since onset.Associated symptoms include abdominal pain. Pertinent negatives include no chest pain, no headaches and no shortness of breath. Nothing aggravates the symptoms. Nothing relieves the symptoms. She has tried nothing for the symptoms. The treatment provided no relief.  Had constipation and worsening gas since taking medications she bought for constipation.  Now 24 hours of numbness in the head L arm and LLE.  No weakness no changes in vision nor speech.  Has not taken anything for the gas.  No urinary symptoms  Past Medical History  Diagnosis Date  . Diabetes mellitus without complication   . Hypertension     History reviewed. No pertinent past surgical history.  No family history on file.  History  Substance Use Topics  . Smoking status: Not on file  . Smokeless tobacco: Not on file  . Alcohol Use: No    OB History   Grav Para Term Preterm Abortions TAB SAB Ect Mult Living                  Review  of Systems  Respiratory: Negative for shortness of breath.   Cardiovascular: Negative for chest pain.  Gastrointestinal: Positive for abdominal pain.  Neurological: Negative for headaches.  All other systems reviewed and are negative.    Allergies  Codeine and Ibuprofen  Home Medications   Current Outpatient Rx  Name  Route  Sig  Dispense  Refill  . atenolol-chlorthalidone (TENORETIC) 50-25 MG per tablet   Oral   Take 1 tablet by mouth daily.         Marland Kitchen diltiazem (CARDIZEM CD) 360 MG 24 hr capsule   Oral   Take 360 mg by mouth daily.         Marland Kitchen ezetimibe (ZETIA) 10 MG tablet   Oral   Take 10 mg by mouth daily.         Marland Kitchen glipiZIDE-metformin (METAGLIP) 5-500 MG per tablet   Oral   Take 1-2 tablets by mouth 2 (two) times daily before a meal. Takes 2 tablets in the morning and 1 tablet at bedtime         . hydrALAZINE (APRESOLINE) 100 MG tablet   Oral   Take 100 mg by mouth 3 (three) times daily.         Marland Kitchen losartan (COZAAR) 100 MG tablet   Oral   Take 100 mg by mouth daily.         . ondansetron (ZOFRAN) 4 MG tablet   Oral   Take 1 tablet (4 mg total) by mouth every 6 (six)  hours.   12 tablet   0   . polyethylene glycol powder (GLYCOLAX/MIRALAX) powder   Oral   Take 17 g by mouth 2 (two) times daily. Until daily soft stools  OTC   255 g   0     BP 148/65  Pulse 75  Temp(Src) 98.1 F (36.7 C) (Oral)  Resp 14  SpO2 99%  Physical Exam  Constitutional: She is oriented to person, place, and time. She appears well-developed and well-nourished. No distress.  HENT:  Head: Normocephalic and atraumatic.  Mouth/Throat: Oropharynx is clear and moist.  Eyes: Conjunctivae and EOM are normal. Pupils are equal, round, and reactive to light.  Neck: Normal range of motion. Neck supple.  Cardiovascular: Normal rate, regular rhythm and intact distal pulses.   Pulmonary/Chest: Effort normal and breath sounds normal. She has no wheezes. She has no rales.   Abdominal: She exhibits no distension. Bowel sounds are increased. There is no tenderness. There is no rebound and no guarding.  Musculoskeletal: Normal range of motion. She exhibits no edema.  Neurological: She is alert and oriented to person, place, and time. She has normal reflexes. No cranial nerve deficit.  Skin: Skin is warm and dry.  Psychiatric: She has a normal mood and affect.    ED Course  Procedures (including critical care time)  Labs Reviewed  CBC WITH DIFFERENTIAL - Abnormal; Notable for the following:    WBC 11.2 (*)    Neutro Abs 8.5 (*)    All other components within normal limits  COMPREHENSIVE METABOLIC PANEL - Abnormal; Notable for the following:    Potassium 3.2 (*)    Chloride 95 (*)    Glucose, Bld 114 (*)    Creatinine, Ser 1.11 (*)    Total Bilirubin 0.2 (*)    GFR calc non Af Amer 51 (*)    GFR calc Af Amer 59 (*)    All other components within normal limits  URINALYSIS, ROUTINE W REFLEX MICROSCOPIC  LIPASE, BLOOD  POCT I-STAT TROPONIN I   No results found.   No diagnosis found.    MDM  No hemoccult done on this patient, it was resulted on the wrong patient.    Seen by Dr. Roseanne Reno of neurology admit for tia work up   Date: 06/20/2012  Rate: 82  Rhythm: normal sinus rhythm  QRS Axis: normal  Intervals: QT prolonged  ST/T Wave abnormalities: nonspecific ST changes  Conduction Disutrbances:none  Narrative Interpretation:   Old EKG Reviewed: none available          Inga Noller K Lindzie Boxx-Rasch, MD 06/20/12 [phone]

## 2012-06-20 NOTE — Consult Note (Signed)
NEURO HOSPITALIST CONSULT NOTE    Reason for Consult: New-onset numbness.  HPI:                                                                                                                                          [name] is an 67 y.o. female a history of hypertension, diabetes mellitus and chronic abdominal pain who came to the emergency room complaining of abdominal pain but also gave a history of numbness which started in the left arm and face and progressed to left and right lower extremities. No weakness was experienced. She has not experienced a change in speech. Onset is unclear. She initially indicated that numbness started 24 hours prior to coming to the emergency room but later indicated numbness started around noon on 06/19/2012. CT scan of her head showed no acute intracranial abnormality. There is no previous history of stroke nor TIA. She has not been on antiplatelet therapy.  Past Medical History  Diagnosis Date  . Diabetes mellitus without complication   . Hypertension     History reviewed. No pertinent past surgical history.  History reviewed. No pertinent family history. Or Wo will because of this the symptoms and this is still persisting and is connected to rule out stroke or have tried matter was seen in consultation with referring this year was benign  Social History:  reports that she does not drink alcohol or use illicit drugs. Her tobacco history is not on file.  Allergies  Allergen Reactions  . Codeine     REACTION: Hallucinations  . Ibuprofen     REACTION: Elevated Blood Pressure    MEDICATIONS:                                                                                                                     Prior to Admission:  Tenoretic 50-25 one tablet daily Cardizem CD 360 mg per day Zetia 10 mg per day Glipizide-metformin 5-502 tablets in the morning and one tablet at bedtime Hydralazine 100 mg 3 times a day Cozaar  100 mg per day Zofran 4 mg every 6 hours MiraLax 17 g twice a day  ROS:  History obtained from the patient  General ROS: negative for - chills, fatigue, fever, night sweats, weight gain or weight loss Psychological ROS: negative for - behavioral disorder, hallucinations, memory difficulties, mood swings or suicidal ideation Ophthalmic ROS: negative for - blurry vision, double vision, eye pain or loss of vision ENT ROS: negative for - epistaxis, nasal discharge, oral lesions, sore throat, tinnitus or vertigo Allergy and Immunology ROS: negative for - hives or itchy/watery eyes Hematological and Lymphatic ROS: negative for - bleeding problems, bruising or swollen lymph nodes Endocrine ROS: negative for - galactorrhea, hair pattern changes, polydipsia/polyuria or temperature intolerance Respiratory ROS: negative for - cough, hemoptysis, shortness of breath or wheezing Cardiovascular ROS: negative for - chest pain, dyspnea on exertion, edema or irregular heartbeat Gastrointestinal ROS: negative for - positive for abdominal pain Genito-Urinary ROS: negative for - dysuria, hematuria, incontinence or urinary frequency/urgency Musculoskeletal ROS: negative for - joint swelling or muscular weakness Neurological ROS: as noted in HPI Dermatological ROS: negative for rash and skin lesion changes   Blood pressure 157/53, pulse 78, temperature 98.1 F (36.7 C), temperature source Oral, resp. rate 14, SpO2 100.00%.   Neurologic Examination:                                                                                                      Mental Status: Alert, oriented, complaining of abdominal pain and tearful.  Speech fluent without evidence of aphasia. Able to follow commands without difficulty. Cranial Nerves: II-Visual fields were normal. III/IV/VI-Pupils were  equal and reacted. Extraocular movements were full and conjugate.    V/VII-no facial numbness and no facial weakness. VIII-normal. X-normal speech and symmetrical palatal movement. Motor: 5/5 bilaterally with normal tone and bulk Sensory: Normal throughout. Deep Tendon Reflexes: 1+ and symmetric. Plantars: Flexor bilaterally Cerebellar: Minimal tremulousness with finger-to-nose testing. Carotid auscultation: Normal  Lab Results  Component Value Date/Time   CHOL 156 01/10/2009  8:12 PM    Results for orders placed during the hospital encounter of 06/20/12 (from the past 48 hour(s))  CBC WITH DIFFERENTIAL     Status: Abnormal   Collection Time    06/19/12  7:13 PM      Result Value Range   WBC 11.2 (*) 4.0 - 10.5 K/uL   RBC 4.28  3.87 - 5.11 MIL/uL   Hemoglobin 13.0  12.0 - 15.0 g/dL   HCT 78.2  95.6 - 21.3 %   MCV 89.0  78.0 - 100.0 fL   MCH 30.4  26.0 - 34.0 pg   MCHC 34.1  30.0 - 36.0 g/dL   RDW 08.6  57.8 - 46.9 %   Platelets 243  150 - 400 K/uL   Neutrophils Relative % 76  43 - 77 %   Neutro Abs 8.5 (*) 1.7 - 7.7 K/uL   Lymphocytes Relative 18  12 - 46 %   Lymphs Abs 2.0  0.7 - 4.0 K/uL   Monocytes Relative 5  3 - 12 %   Monocytes Absolute 0.6  0.1 - 1.0 K/uL   Eosinophils Relative 0  0 - 5 %  Eosinophils Absolute 0.0  0.0 - 0.7 K/uL   Basophils Relative 0  0 - 1 %   Basophils Absolute 0.0  0.0 - 0.1 K/uL  COMPREHENSIVE METABOLIC PANEL     Status: Abnormal   Collection Time    06/19/12  7:13 PM      Result Value Range   Sodium 136  135 - 145 mEq/L   Potassium 3.2 (*) 3.5 - 5.1 mEq/L   Chloride 95 (*) 96 - 112 mEq/L   CO2 24  19 - 32 mEq/L   Glucose, Bld 114 (*) 70 - 99 mg/dL   BUN 19  6 - 23 mg/dL   Creatinine, Ser 1.61 (*) 0.50 - 1.10 mg/dL   Calcium 9.7  8.4 - 09.6 mg/dL   Total Protein 8.3  6.0 - 8.3 g/dL   Albumin 4.0  3.5 - 5.2 g/dL   AST 28  0 - 37 U/L   ALT 25  0 - 35 U/L   Alkaline Phosphatase 103  39 - 117 U/L   Total Bilirubin 0.2 (*) 0.3 - 1.2  mg/dL   GFR calc non Af Amer 51 (*) >90 mL/min   GFR calc Af Amer 59 (*) >90 mL/min   Comment:            The eGFR has been calculated     using the CKD EPI equation.     This calculation has not been     validated in all clinical     situations.     eGFR's persistently     <90 mL/min signify     possible Chronic Kidney Disease.  LIPASE, BLOOD     Status: None   Collection Time    06/19/12  7:13 PM      Result Value Range   Lipase 52  11 - 59 U/L  URINALYSIS, ROUTINE W REFLEX MICROSCOPIC     Status: None   Collection Time    06/19/12  7:35 PM      Result Value Range   Color, Urine YELLOW  YELLOW   APPearance CLEAR  CLEAR   Specific Gravity, Urine 1.008  1.005 - 1.030   pH 7.5  5.0 - 8.0   Glucose, UA NEGATIVE  NEGATIVE mg/dL   Hgb urine dipstick NEGATIVE  NEGATIVE   Bilirubin Urine NEGATIVE  NEGATIVE   Ketones, ur NEGATIVE  NEGATIVE mg/dL   Protein, ur NEGATIVE  NEGATIVE mg/dL   Urobilinogen, UA 0.2  0.0 - 1.0 mg/dL   Nitrite NEGATIVE  NEGATIVE   Leukocytes, UA NEGATIVE  NEGATIVE   Comment: MICROSCOPIC NOT DONE ON URINES WITH NEGATIVE PROTEIN, BLOOD, LEUKOCYTES, NITRITE, OR GLUCOSE <1000 mg/dL.  POCT I-STAT TROPONIN I     Status: None   Collection Time    06/19/12  7:45 PM      Result Value Range   Troponin i, poc 0.00  0.00 - 0.08 ng/mL   Comment 3            Comment: Due to the release kinetics of cTnI,     a negative result within the first hours     of the onset of symptoms does not rule out     myocardial infarction with certainty.     If myocardial infarction is still suspected,     repeat the test at appropriate intervals.  OCCULT BLOOD, POC DEVICE     Status: Abnormal   Collection Time    06/20/12  2:05 AM  Result Value Range   Fecal Occult Bld POSITIVE (*) NEGATIVE    Ct Head Wo Contrast  06/20/2012   *RADIOLOGY REPORT*  Clinical Data: New onset of left arm and left leg numbness.  CT HEAD WITHOUT CONTRAST  Technique:  Contiguous axial images were  obtained from the base of the skull through the vertex without contrast.  Comparison: None.  Findings: There is no evidence of acute infarction, mass lesion, or intra- or extra-axial hemorrhage on CT.  The posterior fossa, including the cerebellum, brainstem and fourth ventricle, is within normal limits.  The third and lateral ventricles, and basal ganglia are unremarkable in appearance.  The cerebral hemispheres are symmetric in appearance, with normal gray- white differentiation.  No mass effect or midline shift is seen.  There is no evidence of fracture; visualized osseous structures are unremarkable in appearance.  The visualized portions of the orbits are within normal limits.  The paranasal sinuses and mastoid air cells are well-aerated.  Cerumen is noted partially filling the external auditory canals bilaterally.  IMPRESSION:  1.  No acute intracranial pathology seen on CT. 2.  Cerumen noted partially filling the external auditory canals bilaterally.   Original Report Authenticated By: Tonia Ghent, M.D.   Dg Abd Acute W/chest  06/20/2012   *RADIOLOGY REPORT*  Clinical Data: Abdominal pain; left arm and leg numbness.  Poor appetite.  ACUTE ABDOMEN SERIES (ABDOMEN 2 VIEW & CHEST 1 VIEW)  Comparison: Chest and abdominal radiographs performed 07/09/2009, and CT of the abdomen and pelvis performed 06/10/2012  Findings: The lungs are well-aerated and clear.  There is no evidence of focal opacification, pleural effusion or pneumothorax. The cardiomediastinal silhouette is within normal limits.  The visualized bowel gas pattern is unremarkable.  Scattered stool and air are seen within the colon; there is no evidence of small bowel dilatation to suggest obstruction.  No free intra-abdominal air is identified on the provided upright view.  No acute osseous abnormalities are seen; the sacroiliac joints are unremarkable in appearance.  Mild degenerative change is noted at the lower lumbar spine.  IMPRESSION:  1.   Unremarkable bowel gas pattern; no free intra-abdominal air seen. 2.  No acute cardiopulmonary process identified.   Original Report Authenticated By: Tonia Ghent, M.D.     Assessment/Plan: 67 year old lady with hypertension and diabetes mellitus with new onset numbness of unclear etiology particularly with bilateral involvement. Patient had no objective signs of acute stroke. She has significant risk factors for stroke, however.   Recommendations: 1. MRI of the brain without contrast rule out acute right subcortical ischemic cerebral infarction 2. Aspirin 81 mg per day 3. Stroke risk assessment if MRI shows acute infarction.  Venetia Maxon M.D. [facility] [phone]  06/20/2012, 2:48 AM

## 2012-06-20 NOTE — Progress Notes (Signed)
NEURO HOSPITALIST PROGRESS NOTE   SUBJECTIVE:                                                                                                                        Patient continues to have the tingling sensation on the left aspect of face into her arm and both legs. Patient admits to having migraines in past but these stopped after her hysterectomy. Currently she is having no HA.   OBJECTIVE:                                                                                                                           Vital signs in last 24 hours: Temp:  [98.1 F (36.7 C)-98.8 F (37.1 C)] 98.8 F (37.1 C) (05/30 0538) Pulse Rate:  [69-80] 80 (05/30 0538) Resp:  [14-20] 18 (05/30 0538) BP: (145-170)/(53-65) 170/59 mmHg (05/30 0538) SpO2:  [96 %-100 %] 100 % (05/30 0538) Weight:  [70.1 kg (154 lb 8.7 oz)] 70.1 kg (154 lb 8.7 oz) (05/30 0539)  Intake/Output from previous day:   Intake/Output this shift:   Nutritional status: Carb Control  Past Medical History  Diagnosis Date  . Diabetes mellitus without complication   . Hypertension      Neurologic Exam:  Mental Status: Alert, oriented, thought content appropriate.  Speech fluent without evidence of aphasia.  Able to follow 3 step commands without difficulty. Cranial Nerves: II: Discs flat bilaterally; Visual fields grossly normal, pupils equal, round, reactive to light and accommodation III,IV, VI: ptosis not present, extra-ocular motions intact bilaterally V,VII: smile symmetric, facial light touch sensation normal bilaterally VIII: hearing normal bilaterally IX,X: gag reflex present XI: bilateral shoulder shrug XII: midline tongue extension Motor: Right : Upper extremity   5/5    Left:     Upper extremity   5/5  Lower extremity   5/5     Lower extremity   5/5 Tone and bulk:normal tone throughout; no atrophy noted Sensory: Pinprick and light touch intact throughout, bilaterally Deep  Tendon Reflexes: 2+ and symmetric throughout Plantars: Right: downgoing   Left: downgoing Cerebellar: normal finger-to-nose,  normal heel-to-shin test CV: pulses palpable throughout    Lab Results: Lab Results  Component Value Date/Time   CHOL 156  01/10/2009  8:12 PM   Lipid Panel No results found for this basename: CHOL, TRIG, HDL, CHOLHDL, VLDL, LDLCALC,  in the last 72 hours  Studies/Results: Ct Head Wo Contrast  06/20/2012   *RADIOLOGY REPORT*  Clinical Data: New onset of left arm and left leg numbness.  CT HEAD WITHOUT CONTRAST  Technique:  Contiguous axial images were obtained from the base of the skull through the vertex without contrast.  Comparison: None.  Findings: There is no evidence of acute infarction, mass lesion, or intra- or extra-axial hemorrhage on CT.  The posterior fossa, including the cerebellum, brainstem and fourth ventricle, is within normal limits.  The third and lateral ventricles, and basal ganglia are unremarkable in appearance.  The cerebral hemispheres are symmetric in appearance, with normal gray- white differentiation.  No mass effect or midline shift is seen.  There is no evidence of fracture; visualized osseous structures are unremarkable in appearance.  The visualized portions of the orbits are within normal limits.  The paranasal sinuses and mastoid air cells are well-aerated.  Cerumen is noted partially filling the external auditory canals bilaterally.  IMPRESSION:  1.  No acute intracranial pathology seen on CT. 2.  Cerumen noted partially filling the external auditory canals bilaterally.   Original Report Authenticated By: Tonia Ghent, M.D.   Dg Abd Acute W/chest  06/20/2012   *RADIOLOGY REPORT*  Clinical Data: Abdominal pain; left arm and leg numbness.  Poor appetite.  ACUTE ABDOMEN SERIES (ABDOMEN 2 VIEW & CHEST 1 VIEW)  Comparison: Chest and abdominal radiographs performed 07/09/2009, and CT of the abdomen and pelvis performed 06/10/2012  Findings:  The lungs are well-aerated and clear.  There is no evidence of focal opacification, pleural effusion or pneumothorax. The cardiomediastinal silhouette is within normal limits.  The visualized bowel gas pattern is unremarkable.  Scattered stool and air are seen within the colon; there is no evidence of small bowel dilatation to suggest obstruction.  No free intra-abdominal air is identified on the provided upright view.  No acute osseous abnormalities are seen; the sacroiliac joints are unremarkable in appearance.  Mild degenerative change is noted at the lower lumbar spine.  IMPRESSION:  1.  Unremarkable bowel gas pattern; no free intra-abdominal air seen. 2.  No acute cardiopulmonary process identified.   Original Report Authenticated By: Tonia Ghent, M.D.    MEDICATIONS                                                                                                                        Scheduled: . atenolol  50 mg Oral Daily  . chlorthalidone  25 mg Oral Daily  . diltiazem  360 mg Oral Daily  . ezetimibe  10 mg Oral Daily  . hydrALAZINE  100 mg Oral Q8H  . insulin aspart  0-15 Units Subcutaneous TID WC  . losartan  100 mg Oral Daily  . ondansetron  4 mg Oral Q6H  . polyethylene glycol  17 g Oral BID  .  sodium chloride  3 mL Intravenous Q12H    Assessment and plan discussed with with attending physician and they are in agreement.    Felicie Morn PA-C [facility] [phone]  06/20/2012, 9:22 AM   ASSESSMENT/PLAN:                                                                                                            67 YO female with subjective sensation of tingling starting in left aspect of face and then spreading to left arm, leg and then bilateral legs.  MRI shows acute posterior circulation infarct. Will need stroke workup, though likely small vessel disease.   Recommend: 1) Echo 2) Assessment for other AS disease with carotid dopplers.  3) Lipid panel, A1C 4)  Frequent neuro checks 5) Telemetry 6) PT, OT  Ritta Slot, MD [facility] [phone]  If 7pm- 7am, please page neurology on call at [phone].

## 2012-06-21 DIAGNOSIS — E876 Hypokalemia: Secondary | ICD-10-CM

## 2012-06-21 DIAGNOSIS — I10 Essential (primary) hypertension: Secondary | ICD-10-CM

## 2012-06-21 DIAGNOSIS — E119 Type 2 diabetes mellitus without complications: Secondary | ICD-10-CM

## 2012-06-21 DIAGNOSIS — I6350 Cerebral infarction due to unspecified occlusion or stenosis of unspecified cerebral artery: Secondary | ICD-10-CM

## 2012-06-21 LAB — CBC
HCT: 34.4 % — ABNORMAL LOW (ref 36.0–46.0)
Hemoglobin: 11.6 g/dL — ABNORMAL LOW (ref 12.0–15.0)
MCH: 30.4 pg (ref 26.0–34.0)
MCHC: 33.7 g/dL (ref 30.0–36.0)
MCV: 90.1 fL (ref 78.0–100.0)
Platelets: 231 10*3/uL (ref 150–400)
RBC: 3.82 MIL/uL — ABNORMAL LOW (ref 3.87–5.11)
RDW: 13.8 % (ref 11.5–15.5)
WBC: 9.9 10*3/uL (ref 4.0–10.5)

## 2012-06-21 LAB — GLUCOSE, CAPILLARY
Glucose-Capillary: 137 mg/dL — ABNORMAL HIGH (ref 70–99)
Glucose-Capillary: 182 mg/dL — ABNORMAL HIGH (ref 70–99)
Glucose-Capillary: 156 mg/dL — ABNORMAL HIGH (ref 70–99)

## 2012-06-21 LAB — LIPID PANEL
Cholesterol: 189 mg/dL (ref 0–200)
HDL: 68 mg/dL (ref >39)
LDL Cholesterol: 100 mg/dL — ABNORMAL HIGH (ref 0–99)
Total CHOL/HDL Ratio: 2.8 RATIO
Triglycerides: 107 mg/dL (ref <150)
VLDL: 21 mg/dL (ref 0–40)

## 2012-06-21 LAB — BASIC METABOLIC PANEL
BUN: 18 mg/dL (ref 6–23)
CO2: 26 mEq/L (ref 19–32)
Calcium: 8.7 mg/dL (ref 8.4–10.5)
Chloride: 100 mEq/L (ref 96–112)
Creatinine, Ser: 1.11 mg/dL — ABNORMAL HIGH (ref 0.50–1.10)
GFR calc Af Amer: 59 mL/min — ABNORMAL LOW (ref >90)
GFR calc non Af Amer: 51 mL/min — ABNORMAL LOW (ref >90)
Glucose, Bld: 133 mg/dL — ABNORMAL HIGH (ref 70–99)
Potassium: 3.6 mEq/L (ref 3.5–5.1)
Sodium: 138 mEq/L (ref 135–145)

## 2012-06-21 LAB — MAGNESIUM: Magnesium: 2.1 mg/dL (ref 1.5–2.5)

## 2012-06-21 MED ORDER — SIMETHICONE 80 MG PO CHEW
80.0000 mg | CHEWABLE_TABLET | Freq: Four times a day (QID) | ORAL | Status: DC | PRN
  Administered 2012-06-21 – 2012-06-23 (×4): 80 mg via ORAL
  Filled 2012-06-21 (×3): qty 1

## 2012-06-21 MED ORDER — SIMVASTATIN 20 MG PO TABS
20.0000 mg | ORAL_TABLET | Freq: Every day | ORAL | Status: DC
  Administered 2012-06-21: 20 mg via ORAL
  Filled 2012-06-21 (×3): qty 1

## 2012-06-21 NOTE — Evaluation (Signed)
Physical Therapy Evaluation Patient Details Name: [name] MRN: [id number] DOB: 24-Jun-1945 Today's Date: 06/21/2012 Time: 4098-1191 PT Time Calculation (min): 19 min  PT Assessment / Plan / Recommendation Clinical Impression  67 y.o. female admitted to [facility] for chronic abdominal pain, left arm and bil leg numbness and weakness.  MRI confirms a small focus of acute infarction in the splenium of the corpus callosum. Infarct felt to be thrombotic secondary to small vessel disease.  She presents today with decreased acitivity tolerance due to nausea and dizziness.  She requires mod assist for gait to balance herself.  BP 119/49 after walk.  Daughters are supportive, but are not sure they can provide 24/7 care (one works and has a young daughter).  This patient would be an excellent inpatient rehab candidate.      PT Assessment  Patient needs continued PT services    Follow Up Recommendations  CIR    Does the patient have the potential to tolerate intense rehabilitation     Yes  Barriers to Discharge Decreased caregiver support unsure if family/friends can provide 24/7 support.      Equipment Recommendations  Rolling walker with 5" wheels    Recommendations for Other Services Rehab consult   Frequency Min 4X/week    Precautions / Restrictions Precautions Precautions: Fall   Pertinent Vitals/Pain See vitals flow sheet.      Mobility  Transfers Transfers: Stand to Sit Stand to Sit: 4: Min assist;With armrests;To chair/3-in-1;With upper extremity assist Details for Transfer Assistance: min assist to help pt lower to recliner chair using armrests.  Verbal cues for safe technique.   Ambulation/Gait Ambulation/Gait Assistance: 4: Min assist Ambulation Distance (Feet): 120 Feet Assistive device: 1 person hand held assist Ambulation/Gait Assistance Details: pt supported at truk with her arm around the therapist.  Needing assist to feel steady during gait.  No functional  weakneness noted (legs buckling, poor coordination of extremities).   Gait Pattern: Step-through pattern Gait velocity: less than 1.8 ft/sec indicating risk for recurrent falls.  General Gait Details:   Modified Rankin (Stroke Patients Only) Pre-Morbid Rankin Score: No symptoms Modified Rankin: Moderately severe disability        PT Diagnosis: Difficulty walking;Abnormality of gait;Generalized weakness;Acute pain  PT Problem List: Decreased activity tolerance;Decreased balance;Decreased mobility;Pain;Impaired sensation PT Treatment Interventions: DME instruction;Gait training;Stair training;Functional mobility training;Therapeutic activities;Therapeutic exercise;Balance training;Neuromuscular re-education;Patient/family education   PT Goals Acute Rehab PT Goals PT Goal Formulation: With patient Time For Goal Achievement: 07/05/12 Potential to Achieve Goals: Good Pt will go Supine/Side to Sit: with modified independence;with HOB 0 degrees PT Goal: Supine/Side to Sit - Progress: Goal set today Pt will go Sit to Supine/Side: with modified independence;with HOB 0 degrees PT Goal: Sit to Supine/Side - Progress: Goal set today Pt will go Sit to Stand: with modified independence PT Goal: Sit to Stand - Progress: Goal set today Pt will go Stand to Sit: with modified independence PT Goal: Stand to Sit - Progress: Goal set today Pt will Transfer Bed to Chair/Chair to Bed: with modified independence PT Transfer Goal: Bed to Chair/Chair to Bed - Progress: Goal set today Pt will Ambulate: >150 feet;with modified independence;with least restrictive assistive device PT Goal: Ambulate - Progress: Goal set today Pt will Go Up / Down Stairs: 1-2 stairs;with modified independence;with least restrictive assistive device PT Goal: Up/Down Stairs - Progress: Goal set today  Visit Information  Last PT Received On: 06/21/12 Assistance Needed: +1 PT/OT Co-Evaluation/Treatment: Yes    Subjective Data  Subjective: Pt reports she doesn't feel like the room is spinning she feels like she is going to pass out or throw up Patient Stated Goal: to feel better (decreased nausea and dizziness).     Prior Functioning  Home Living Lives With: Alone Available Help at Discharge: Family;Available PRN/intermittently Type of Home: House Home Access: Stairs to enter [employer] of Steps: 1 Entrance Stairs-Rails: None (holds on to storm door) Home Layout: One level Bathroom Shower/Tub: Engineer, manufacturing systems: Standard Home Adaptive Equipment: None Prior Function Level of Independence: Independent Driving: No Communication Communication: No difficulties    Cognition  Cognition Arousal/Alertness: Lethargic Behavior During Therapy:  (emotional during session) Overall Cognitive Status: Within Functional Limits for tasks assessed (not specifically tested by PT)    Extremity/Trunk Assessment Right Lower Extremity Assessment RLE ROM/Strength/Tone: Within functional levels RLE Sensation: Deficits RLE Sensation Deficits: pt reports tingling, worse in left leg compared to right.   Left Lower Extremity Assessment LLE ROM/Strength/Tone: Within functional levels LLE Sensation: Deficits LLE Sensation Deficits: pt reports tingling, worse in left leg compared to right.        End of Session PT - End of Session Equipment Utilized During Treatment: Gait belt Activity Tolerance: Patient limited by fatigue;Other (comment) (limited due to dizziness/feeling ill) Patient left: in chair;with call bell/phone within reach;with family/visitor present    Lurena Joiner B. Berdia Lachman, PT, DPT [phone]   06/21/2012, 3:18 PM

## 2012-06-21 NOTE — Progress Notes (Signed)
[facility] PROGRESS NOTE  ARIYONA EID JXB:[id number] DOB: March 01, 1945 DOA: 06/20/2012 PCP: Dayna Barker  Brief narrative 67 y.o. female with h/o HTN, chronic abdominal pain, DM2, who presents to the ED. While in the ED being worked up for her chronic abdominal pain (work up was unremarkable) the patient also gives a history of 1 day onset of numbness in her L arm, L leg, and R leg. She states there is no associated weakness. Symptoms have been persistent.  CT head in the ED was negative, neurology consulted and recommended admission to rule out CVA.  Assessment/Plan: 1. Acute right brain stroke (posterior circulation): Neurology consultation appreciated. Continue Plavix for secondary stroke prevention. MRA brain: Negative. Carotid Dopplers: Right-40-59% ICA stenosis. Left no hemodynamically significant ICA stenosis. Echo: LVEF 60-65%. LDL 100. Start statins. 2. Diabetes mellitus type 2: Reasonably controlled inpatient. Check A1c-7.2. 3. Hypertension: Allow for permissive hypertension. Continue losartan, chlorthalidone, atenolol, Cardizem and hydralazine. Controlled 4. Hypokalemia: Repleted 5. Anemia: Possibly chronic. Stable   Code Status: Full Family Communication: Discussed with patient's 2 daughters and a granddaughter at bedside. Disposition Plan: CIR consult   Consultants:  Neurology  Procedures:  None  Antibiotics:  None   HPI/Subjective: Left upper extremity numbness, intermittent left-sided face numbness and numbness of both legs below knees-overall better. Gives history of intermittent long-standing abdominal discomfort but no nausea or vomiting.  Objective: Filed Vitals:   06/21/12 0920 06/21/12 1359 06/21/12 1427 06/21/12 1437  BP: 125/45 115/39 119/49 119/46  Pulse: 73 66    Temp: 98 F (36.7 C) 98.5 F (36.9 C)    TempSrc: Oral Oral    Resp: 18 20    Height:      Weight:      SpO2: 99% 100%      Intake/Output Summary (Last 24 hours) at  06/21/12 1753 Last data filed at 06/21/12 1300  Gross per 24 hour  Intake    600 ml  Output      0 ml  Net    600 ml   Filed Weights   06/20/12 0539  Weight: 70.1 kg (154 lb 8.7 oz)    Exam:   General exam: Comfortable.  Respiratory system: Clear. No increased work of breathing.  Cardiovascular system: S1 & S2 heard, RRR. No JVD, murmurs, gallops, clicks or pedal edema. Telemetry: Sinus rhythm  Gastrointestinal system: Abdomen is nondistended, soft and nontender. Normal bowel sounds heard.  Central nervous system: Alert and oriented. No focal neurological deficits.  Extremities: Symmetric 5 x 5 power.   Data Reviewed: Basic Metabolic Panel:  Recent Labs Lab 06/19/12 1913 06/20/12 0445 06/21/12 0404  NA 136 137 138  K 3.2* 3.2* 3.6  CL 95* 98 100  CO2 24 28 26   GLUCOSE 114* 130* 133*  BUN 19 18 18   CREATININE 1.11* 1.15* 1.11*  CALCIUM 9.7 8.9 8.7  MG  --   --  2.1   Liver Function Tests:  Recent Labs Lab 06/19/12 1913  AST 28  ALT 25  ALKPHOS 103  BILITOT 0.2*  PROT 8.3  ALBUMIN 4.0    Recent Labs Lab 06/19/12 1913  LIPASE 52   No results found for this basename: AMMONIA,  in the last 168 hours CBC:  Recent Labs Lab 06/19/12 1913 06/20/12 0445 06/21/12 0404  WBC 11.2* 11.0* 9.9  NEUTROABS 8.5*  --   --   HGB 13.0 11.7* 11.6*  HCT 38.1 33.4* 34.4*  MCV 89.0 88.4 90.1  PLT 243 219 231  Cardiac Enzymes: No results found for this basename: CKTOTAL, CKMB, CKMBINDEX, TROPONINI,  in the last 168 hours BNP (last 3 results) No results found for this basename: PROBNP,  in the last 8760 hours CBG:  Recent Labs Lab 06/20/12 1613 06/20/12 2106 06/21/12 0639 06/21/12 1133 06/21/12 1634  GLUCAP 143* 168* 137* 182* 156*    No results found for this or any previous visit (from the past 240 hour(s)).   Studies: Ct Head Wo Contrast  06/20/2012   *RADIOLOGY REPORT*  Clinical Data: New onset of left arm and left leg numbness.  CT HEAD  WITHOUT CONTRAST  Technique:  Contiguous axial images were obtained from the base of the skull through the vertex without contrast.  Comparison: None.  Findings: There is no evidence of acute infarction, mass lesion, or intra- or extra-axial hemorrhage on CT.  The posterior fossa, including the cerebellum, brainstem and fourth ventricle, is within normal limits.  The third and lateral ventricles, and basal ganglia are unremarkable in appearance.  The cerebral hemispheres are symmetric in appearance, with normal gray- white differentiation.  No mass effect or midline shift is seen.  There is no evidence of fracture; visualized osseous structures are unremarkable in appearance.  The visualized portions of the orbits are within normal limits.  The paranasal sinuses and mastoid air cells are well-aerated.  Cerumen is noted partially filling the external auditory canals bilaterally.  IMPRESSION:  1.  No acute intracranial pathology seen on CT. 2.  Cerumen noted partially filling the external auditory canals bilaterally.   Original Report Authenticated By: Tonia Ghent, M.D.   Mr [facility] Wo Contrast  06/20/2012   *RADIOLOGY REPORT*  Clinical Data:  CVA.  Diabetes and hypertension  MRI HEAD WITHOUT CONTRAST MRA HEAD WITHOUT CONTRAST  Technique:  Multiplanar, multiecho pulse sequences of the brain and surrounding structures were obtained without intravenous contrast. Angiographic images of the head were obtained using MRA technique without contrast.  Comparison:  CT head 06/20/2012  MRI HEAD  Findings:  Small focus of acute infarction in the splenium of the corpus callosum in the midline.  No other acute infarct.  Mild hyperintensity in the periventricular white matter, consistent with very mild chronic microvascular ischemia.  No cortical infarct.  Brainstem and cerebellum are intact.  Negative for intracranial hemorrhage.  No mass or edema.  Ventricle size is normal.  IMPRESSION: Small focus of acute infarction  splenium of the corpus callosum. Mild chronic microvascular ischemic change in the white matter.  MRA HEAD  Findings: Both vertebral arteries are patent to the basilar.  Right PICA is patent.  Left PICA not visualized.  AICA, superior cerebellar, and posterior cerebral arteries are widely patent.  The basilar is widely patent.  The internal carotid artery is widely patent.  The anterior and middle cerebral arteries are widely patent without stenosis.  Negative for cerebral aneurysm.  IMPRESSION: Negative   Original Report Authenticated By: Janeece Riggers, M.D.   Mr Brain Wo Contrast  06/20/2012   *RADIOLOGY REPORT*  Clinical Data:  CVA.  Diabetes and hypertension  MRI HEAD WITHOUT CONTRAST MRA HEAD WITHOUT CONTRAST  Technique:  Multiplanar, multiecho pulse sequences of the brain and surrounding structures were obtained without intravenous contrast. Angiographic images of the head were obtained using MRA technique without contrast.  Comparison:  CT head 06/20/2012  MRI HEAD  Findings:  Small focus of acute infarction in the splenium of the corpus callosum in the midline.  No other acute infarct.  Mild hyperintensity  in the periventricular white matter, consistent with very mild chronic microvascular ischemia.  No cortical infarct.  Brainstem and cerebellum are intact.  Negative for intracranial hemorrhage.  No mass or edema.  Ventricle size is normal.  IMPRESSION: Small focus of acute infarction splenium of the corpus callosum. Mild chronic microvascular ischemic change in the white matter.  MRA HEAD  Findings: Both vertebral arteries are patent to the basilar.  Right PICA is patent.  Left PICA not visualized.  AICA, superior cerebellar, and posterior cerebral arteries are widely patent.  The basilar is widely patent.  The internal carotid artery is widely patent.  The anterior and middle cerebral arteries are widely patent without stenosis.  Negative for cerebral aneurysm.  IMPRESSION: Negative   Original Report  Authenticated By: Janeece Riggers, M.D.   Dg Abd Acute W/chest  06/20/2012   *RADIOLOGY REPORT*  Clinical Data: Abdominal pain; left arm and leg numbness.  Poor appetite.  ACUTE ABDOMEN SERIES (ABDOMEN 2 VIEW & CHEST 1 VIEW)  Comparison: Chest and abdominal radiographs performed 07/09/2009, and CT of the abdomen and pelvis performed 06/10/2012  Findings: The lungs are well-aerated and clear.  There is no evidence of focal opacification, pleural effusion or pneumothorax. The cardiomediastinal silhouette is within normal limits.  The visualized bowel gas pattern is unremarkable.  Scattered stool and air are seen within the colon; there is no evidence of small bowel dilatation to suggest obstruction.  No free intra-abdominal air is identified on the provided upright view.  No acute osseous abnormalities are seen; the sacroiliac joints are unremarkable in appearance.  Mild degenerative change is noted at the lower lumbar spine.  IMPRESSION:  1.  Unremarkable bowel gas pattern; no free intra-abdominal air seen. 2.  No acute cardiopulmonary process identified.   Original Report Authenticated By: Tonia Ghent, M.D.   2 D Echo  Study Conclusions  - Left ventricle: The cavity size was normal. Wall thickness was increased in a pattern of mild LVH. Systolic function was normal. The estimated ejection fraction was in the range of 60% to 65%. Wall motion was normal; there were no regional wall motion abnormalities. - Right atrium: The atrium was mildly dilated. - Tricuspid valve: Moderate regurgitation. - Pulmonary arteries: Systolic pressure was moderately increased. PA peak pressure: 53mm Hg (S).    Additional labs:   Scheduled Meds: . atenolol  50 mg Oral Daily  . chlorthalidone  25 mg Oral Daily  . clopidogrel  75 mg Oral Q breakfast  . diltiazem  360 mg Oral Daily  . ezetimibe  10 mg Oral Daily  . hydrALAZINE  100 mg Oral Q8H  . insulin aspart  0-15 Units Subcutaneous TID WC  . losartan  100 mg  Oral Daily  . ondansetron  4 mg Oral Q6H  . polyethylene glycol  17 g Oral BID  . sodium chloride  3 mL Intravenous Q12H   Continuous Infusions:   Principal Problem:   CVA (cerebral infarction) Active Problems:   HYPERTENSION   Numbness   Diabetes mellitus   Hypokalemia    Time spent: 35 minutes    [facility]  [facility] Pager [phone].   If 8PM-8AM, please contact night-coverage at [email or web address], password [facility] 06/21/2012, 5:53 PM  LOS: 1 day

## 2012-06-21 NOTE — Evaluation (Signed)
Occupational Therapy Evaluation Patient Details Name: [name] MRN: [id number] DOB: 08-09-1945 Today's Date: 06/21/2012 Time: 1410-1445 OT Time Calculation (min): 35 min  OT Assessment / Plan / Recommendation Clinical Impression  Pt with history of HTN, DM, and chronic abdominal pain who  came to the ED c/o abdominal pain but also gave hx of numbness which started in LUE and face and progressed to bil LEs. MRI showed small focus of acute infarct splenium of corpus callosum. Will continue to follow acutely in order to address below problem list. Recommending CIR to further progress rehab before eventual return home.    OT Assessment  Patient needs continued OT Services    Follow Up Recommendations  CIR    Barriers to Discharge      Equipment Recommendations  3 in 1 bedside comode    Recommendations for Other Services Rehab consult  Frequency  Min 2X/week    Precautions / Restrictions Precautions Precautions: Fall   Pertinent Vitals/Pain See vitals    ADL  Grooming: Performed;Wash/dry hands;Wash/dry face;Minimal assistance Where Assessed - Grooming: Supported standing Upper Body Bathing: Simulated;Set up Where Assessed - Upper Body Bathing: Unsupported sitting Lower Body Bathing: Simulated;Min guard Where Assessed - Lower Body Bathing: Supported sit to stand Upper Body Dressing: Performed;Minimal assistance Where Assessed - Upper Body Dressing: Unsupported sitting Lower Body Dressing: Performed;Minimal assistance Where Assessed - Lower Body Dressing: Supported sit to stand Toilet Transfer: Simulated;Minimal assistance Toilet Transfer Method:  (ambulating) Acupuncturist: Other (comment) (bed to ambulate in hall then to chair in room) Equipment Used: Gait belt Transfers/Ambulation Related to ADLs: Min assist for ambulation. Pt c/o worsening pain and dizziness with ambulation. Pt supported by placing her arm around pt's waist for minimal support. while  ambulating in hall, PT arrived and joined for co-session. ADL Comments: Pt primarily limited by dizziness and pain. Became very labile when sitting EOB but able to calm down with emotional support.    OT Diagnosis: Generalized weakness;Disturbance of vision;Acute pain  OT Problem List: Decreased strength;Decreased activity tolerance;Impaired balance (sitting and/or standing);Impaired vision/perception;Decreased knowledge of use of DME or AE;Pain OT Treatment Interventions: Self-care/ADL training;DME and/or AE instruction;Therapeutic activities;Patient/family education;Balance training;Cognitive remediation/compensation;Visual/perceptual remediation/compensation   OT Goals Acute Rehab OT Goals OT Goal Formulation: With patient Time For Goal Achievement: 06/28/12 Potential to Achieve Goals: Good ADL Goals Pt Will Perform Grooming: with modified independence;Standing at sink ADL Goal: Grooming - Progress: Goal set today Pt Will Perform Upper Body Bathing: with modified independence;Sitting, chair;Sitting, edge of bed ADL Goal: Upper Body Bathing - Progress: Goal set today Pt Will Perform Lower Body Bathing: with modified independence;Sit to stand from chair;Sit to stand from bed ADL Goal: Lower Body Bathing - Progress: Goal set today Pt Will Perform Upper Body Dressing: with modified independence;Sitting, chair;Sitting, bed ADL Goal: Upper Body Dressing - Progress: Goal set today Pt Will Perform Lower Body Dressing: with modified independence;Sit to stand from chair;Sit to stand from bed ADL Goal: Lower Body Dressing - Progress: Goal set today Pt Will Transfer to Toilet: with modified independence;Ambulation;with DME;Comfort height toilet ADL Goal: Toilet Transfer - Progress: Goal set today Pt Will Perform Toileting - Clothing Manipulation: with modified independence;Standing ADL Goal: Toileting - Clothing Manipulation - Progress: Goal set today Pt Will Perform Toileting - Hygiene: with  modified independence;Sit to stand from 3-in-1/toilet ADL Goal: Toileting - Hygiene - Progress: Goal set today Pt Will Perform Tub/Shower Transfer: Tub transfer;with supervision;Ambulation;with DME ADL Goal: Tub/Shower Transfer - Progress: Goal set today  Visit  Information  Last OT Received On: 06/21/12 Assistance Needed: +1    Subjective Data  Subjective: "I just feel so bad"   Prior Functioning     Home Living Lives With: Alone Available Help at Discharge: Family;Available PRN/intermittently Type of Home: House Home Access: Stairs to enter [employer] of Steps: 1 Entrance Stairs-Rails: None (holds on to storm door) Home Layout: One level Bathroom Shower/Tub: Network engineer: None Prior Function Level of Independence: Independent Driving: No Communication Communication: No difficulties         Vision/Perception Vision - History Baseline Vision: No visual deficits Patient Visual Report: Blurring of vision;Nausea/blurring vision with head movement Vision - Assessment Eye Alignment: Within Functional Limits Vision Assessment: Vision impaired - to be further tested in functional context Additional Comments: Pt c/o blurring and dizziness as if the room is spinning when she sits up.    Cognition  Cognition Arousal/Alertness: Lethargic Behavior During Therapy: Anxious (emotional) Overall Cognitive Status: Within Functional Limits for tasks assessed    Extremity/Trunk Assessment Right Upper Extremity Assessment RUE ROM/Strength/Tone: WFL for tasks assessed RUE Sensation: WFL - Light Touch;WFL - Proprioception RUE Coordination: WFL - gross/fine motor Left Upper Extremity Assessment LUE ROM/Strength/Tone: WFL for tasks assessed LUE Sensation: Deficits LUE Sensation Deficits: c/o of some numbness/tingling LUE Coordination: WFL - gross/fine motor Right Lower Extremity Assessment RLE ROM/Strength/Tone:  Within functional levels RLE Sensation: Deficits RLE Sensation Deficits: pt reports tingling, worse in left leg compared to right.   Left Lower Extremity Assessment LLE ROM/Strength/Tone: Within functional levels LLE Sensation: Deficits LLE Sensation Deficits: pt reports tingling, worse in left leg compared to right.       Mobility Bed Mobility Bed Mobility: Supine to Sit;Sitting - Scoot to Edge of Bed Supine to Sit: 4: Min guard;HOB elevated;With rails Sitting - Scoot to Edge of Bed: 4: Min guard Transfers Transfers: Sit to Stand;Stand to Sit Sit to Stand: 4: Min assist;From bed;With upper extremity assist Stand to Sit: 4: Min assist;To chair/3-in-1;With armrests;With upper extremity assist Details for Transfer Assistance: Min assist for steadying and to control descent to recliner.     Exercise     Balance     End of Session OT - End of Session Equipment Utilized During Treatment: Gait belt Activity Tolerance: Patient limited by pain;Other (comment) (dizziness) Patient left: in chair;with call bell/phone within reach;with family/visitor present Nurse Communication: Mobility status  GO    06/21/2012 Cipriano Mile OTR/L Pager [phone] Office [phone]  Cipriano Mile 06/21/2012, 4:31 PM

## 2012-06-21 NOTE — Progress Notes (Signed)
Stroke Team Progress Note  HISTORY [name] is a 67 y.o. female with a history of hypertension, diabetes mellitus and chronic abdominal pain who came to the emergency room 06/20/2012 complaining of abdominal pain but also gave a history of numbness which started in the left arm and face and progressed to left and right lower extremities. No weakness was experienced. She had not experienced a change in speech. Onset was unclear. She initially indicated that numbness started 24 hours prior to coming to the emergency room but later indicated numbness started around noon on 06/19/2012. CT scan of her head showed no acute intracranial abnormality. There is no previous history of stroke nor TIA. She had not been on antiplatelet therapy.   Patient was not a TPA candidate secondary to late presentation. She was admitted for further evaluation and treatment.  SUBJECTIVE There are multiple family members present this morning. The patient states that her left-sided numbness comes and goes. She feels bad in general. She does not feel that she can participate in therapy today.  OBJECTIVE Most recent Vital Signs: Filed Vitals:   06/20/12 2200 06/21/12 0200 06/21/12 0600 06/21/12 0920  BP: 132/53 131/50 113/47 125/45  Pulse: 66 69 65 73  Temp: 98.3 F (36.8 C) 98.2 F (36.8 C) 98.6 F (37 C) 98 F (36.7 C)  TempSrc: Oral Oral Oral Oral  Resp: 18 18 18 18   Height:      Weight:      SpO2: 100% 99% 99% 99%   CBG (last 3)   Recent Labs  06/20/12 2106 06/21/12 0639 06/21/12 1133  GLUCAP 168* 137* 182*    IV Fluid Intake:     MEDICATIONS  . atenolol  50 mg Oral Daily  . chlorthalidone  25 mg Oral Daily  . clopidogrel  75 mg Oral Q breakfast  . diltiazem  360 mg Oral Daily  . ezetimibe  10 mg Oral Daily  . hydrALAZINE  100 mg Oral Q8H  . insulin aspart  0-15 Units Subcutaneous TID WC  . losartan  100 mg Oral Daily  . ondansetron  4 mg Oral Q6H  . polyethylene glycol  17 g Oral BID   . sodium chloride  3 mL Intravenous Q12H   PRN:  HYDROcodone-acetaminophen  Diet:  Carb Control thin liquids Activity:  Up with assistance DVT Prophylaxis:  None - will order SCDs  CLINICALLY SIGNIFICANT STUDIES Basic Metabolic Panel:  Recent Labs Lab 06/20/12 0445 06/21/12 0404  NA 137 138  K 3.2* 3.6  CL 98 100  CO2 28 26  GLUCOSE 130* 133*  BUN 18 18  CREATININE 1.15* 1.11*  CALCIUM 8.9 8.7  MG  --  2.1   Liver Function Tests:  Recent Labs Lab 06/19/12 1913  AST 28  ALT 25  ALKPHOS 103  BILITOT 0.2*  PROT 8.3  ALBUMIN 4.0   CBC:  Recent Labs Lab 06/19/12 1913 06/20/12 0445 06/21/12 0404  WBC 11.2* 11.0* 9.9  NEUTROABS 8.5*  --   --   HGB 13.0 11.7* 11.6*  HCT 38.1 33.4* 34.4*  MCV 89.0 88.4 90.1  PLT 243 219 231   Coagulation: No results found for this basename: LABPROT, INR,  in the last 168 hours Cardiac Enzymes: No results found for this basename: CKTOTAL, CKMB, CKMBINDEX, TROPONINI,  in the last 168 hours Urinalysis:  Recent Labs Lab 06/19/12 1935  COLORURINE YELLOW  LABSPEC 1.008  PHURINE 7.5  GLUCOSEU NEGATIVE  HGBUR NEGATIVE  BILIRUBINUR NEGATIVE  KETONESUR  NEGATIVE  PROTEINUR NEGATIVE  UROBILINOGEN 0.2  NITRITE NEGATIVE  LEUKOCYTESUR NEGATIVE   Lipid Panel    Component Value Date/Time   CHOL 189 06/21/2012 0404   TRIG 107 06/21/2012 0404   HDL 68 06/21/2012 0404   CHOLHDL 2.8 06/21/2012 0404   VLDL 21 06/21/2012 0404   LDLCALC 100* 06/21/2012 0404   HgbA1C  Lab Results  Component Value Date   HGBA1C 7.2* 06/20/2012    Urine Drug Screen:   No results found for this basename: labopia, cocainscrnur, labbenz, amphetmu, thcu, labbarb    Alcohol Level: No results found for this basename: ETH,  in the last 168 hours  Ct Head Wo Contrast 06/20/2012   1.  No acute intracranial pathology seen on CT. 2.  Cerumen noted partially filling the external auditory canals bilaterally.    Mr Head Wo Contrast 06/20/2012 Small focus of acute  infarction splenium of the corpus callosum. Mild chronic microvascular ischemic change in the white matter.   MRA Head 06/20/2012 Negative  Dg Abd Acute W/chest 06/20/2012  1.  Unremarkable bowel gas pattern; no free intra-abdominal air seen. 2.  No acute cardiopulmonary process identified.    2D Echocardiogram  ejection fraction 60-65%. No cardiac source of emboli identified.  Carotid Doppler  Preliminary report: Right: 40-59% internal carotid artery stenosis by velocity. Increased velocities may be due to tortuosity. Left: No evidence of hemodynamically significant internal carotid artery stenosis. Bilateral: Vertebral artery flow is antegrade.    CXR  No acute cardiopulmonary process identified.    EKG normal sinus rhythm rate 82 beats per minute.  Therapy Recommendations evaluations depending  Physical Exam   NEUROLOGIC:   MENTAL STATUS: awake, alert, oriented with minimal cueing, language fluent,  follows simple commands.  CRANIAL NERVES: pupils equal and reactive to light,extraocular muscles intact, facial sensation and strength symmetric, uvula midlinec, tongue midline MOTOR: normal bulk and tone, Strength - 5/5 throughout. SENSORY: normal and symmetric to light touch  COORDINATION: finger-nose-finger normal - heel to shin normal  REFLEXES: deep tendon reflexes present and symmetric - no babinski    ASSESSMENT [name] is a 67 y.o. female presenting with intermittent left sided numbness. TPA was not given secondary to minimal symptoms and late presentation. MRI confirms a small focus of acute infarction in the splenium of the corpus callosum. Infarct felt to be thrombotic secondary to small vessel disease..  On no antiplatelet therapy prior to admission. Now on clopidogrel 75 mg orally every day for secondary stroke prevention. Patient with resultant intermittent left-sided numbness. Work up complete.   Diabetes mellitus - hemoglobin A1c 7.2  Hypertension  LDL  100 - on Zetia prior to admission  Hospital day # 1  TREATMENT/PLAN  Continue clopidogrel 75 mg orally every day for secondary stroke prevention.  Needs better glucose control.  Risk factor modification  Await therapists evaluations.   Delton See PA-C [facility] Pager [phone] 06/21/2012, 1:08 PM I have personally obtained a history, examined the patient, evaluated imaging results, and formulated the assessment and plan of care. I agree with the above.  Stroke work up complete. Continue plavix for stroke prevention. PT recommended CIR evaluation Will sign off please call with questions.

## 2012-06-22 ENCOUNTER — Encounter (HOSPITAL_COMMUNITY): Payer: Self-pay | Admitting: *Deleted

## 2012-06-22 LAB — GLUCOSE, CAPILLARY
Glucose-Capillary: 252 mg/dL — ABNORMAL HIGH (ref 70–99)
Glucose-Capillary: 134 mg/dL — ABNORMAL HIGH (ref 70–99)
Glucose-Capillary: 146 mg/dL — ABNORMAL HIGH (ref 70–99)
Glucose-Capillary: 171 mg/dL — ABNORMAL HIGH (ref 70–99)
Glucose-Capillary: 179 mg/dL — ABNORMAL HIGH (ref 70–99)

## 2012-06-22 MED ORDER — PANTOPRAZOLE SODIUM 40 MG PO TBEC
40.0000 mg | DELAYED_RELEASE_TABLET | Freq: Every day | ORAL | Status: DC
  Administered 2012-06-22 – 2012-06-23 (×2): 40 mg via ORAL
  Filled 2012-06-22 (×3): qty 1

## 2012-06-22 MED ORDER — SENNA 8.6 MG PO TABS
2.0000 | ORAL_TABLET | Freq: Every day | ORAL | Status: DC
  Administered 2012-06-22 – 2012-06-23 (×2): 17.2 mg via ORAL
  Filled 2012-06-22: qty 1
  Filled 2012-06-22: qty 2
  Filled 2012-06-22: qty 1

## 2012-06-22 MED ORDER — ATORVASTATIN CALCIUM 10 MG PO TABS
10.0000 mg | ORAL_TABLET | Freq: Every day | ORAL | Status: DC
  Administered 2012-06-22 – 2012-06-23 (×2): 10 mg via ORAL
  Filled 2012-06-22 (×2): qty 1

## 2012-06-22 NOTE — Progress Notes (Signed)
[facility] PROGRESS NOTE  [name]:[id number] DOB: 1945-08-08 DOA: 06/20/2012 PCP: [name]  Brief narrative 67 y.o. female with h/o HTN, chronic abdominal pain, DM2, who presents to the ED. While in the ED being worked up for her chronic abdominal pain (work up was unremarkable) the patient also gives a history of 1 day onset of numbness in her L arm, L leg, and R leg. She states there is no associated weakness. Symptoms have been persistent.  CT head in the ED was negative, neurology consulted and recommended admission to rule out CVA.  Assessment/Plan: 1. Acute right brain stroke (posterior circulation): Neurology consultation appreciated. Continue Plavix for secondary stroke prevention. MRA brain: Negative. Carotid Dopplers: Right-40-59% ICA stenosis. Left no hemodynamically significant ICA stenosis. Echo: LVEF 60-65%. LDL 100. Start statins. CIR input pending. 2. Diabetes mellitus type 2: Reasonably controlled inpatient. A1c-7.2-needs better control OP. 3. Hypertension: Controlled. Continue losartan, chlorthalidone, atenolol, Cardizem and hydralazine.  4. Hypokalemia: Repleted 5. Anemia: Possibly chronic. Stable 6. Vague abdominal discomfort: Patient not able to clearly tell us how long she has had the pain. She complains of bubbling sensation in the stomach but denies true pain, nausea, vomiting, constipation or diarrhea. Claims that Gas-X helped relieve pain yesterday. Has never had EGD or colonoscopy. Continue simethicone and add PPI for possible GERD symptoms. Continue bowel regimen. Recent CT abdomen on 5/22 was negative. Recommended OP GI consultation for possible EGD and long over due screening colonoscopy. Patient and family verbalized understanding.  Code Status: Full Family Communication: Discussed with patient's 2 daughters at bedside. Disposition Plan: Pending CIR consult. If accepted than medically stable for discharge to CIR otherwise  home.   Consultants:  Neurology  Procedures:  None  Antibiotics:  None   HPI/Subjective: Left upper extremity numbness, intermittent left-sided face numbness and numbness of both legs below knees-overall better. Vague abdominal discomfort, better after simethicone   Objective: Filed Vitals:   06/22/12 0200 06/22/12 0600 06/22/12 1000 06/22/12 1318  BP: 126/46 129/51 132/76 116/45  Pulse: 64 63 76 56  Temp: 98.7 F (37.1 C) 98.2 F (36.8 C) 98.2 F (36.8 C) 97.9 F (36.6 C)  TempSrc:   Oral Oral  Resp: 20 20 18 16   Height:      Weight:      SpO2: 98% 99% 100% 98%   No intake or output data in the 24 hours ending 06/22/12 1359 Filed Weights   06/20/12 0539  Weight: 70.1 kg (154 lb 8.7 oz)    Exam:   General exam: Comfortable.  Respiratory system: Clear. No increased work of breathing.  Cardiovascular system: S1 & S2 heard, RRR. No JVD, murmurs, gallops, clicks or pedal edema. Telemetry: Sinus rhythm  Gastrointestinal system: Abdomen is nondistended, soft and nontender. Normal bowel sounds heard.  Central nervous system: Alert and oriented. No focal neurological deficits.  Extremities: Symmetric 5 x 5 power.   Data Reviewed: Basic Metabolic Panel:  Recent Labs Lab 06/19/12 1913 06/20/12 0445 06/21/12 0404  NA 136 137 138  K 3.2* 3.2* 3.6  CL 95* 98 100  CO2 24 28 26   GLUCOSE 114* 130* 133*  BUN 19 18 18   CREATININE 1.11* 1.15* 1.11*  CALCIUM 9.7 8.9 8.7  MG  --   --  2.1   Liver Function Tests:  Recent Labs Lab 06/19/12 1913  AST 28  ALT 25  ALKPHOS 103  BILITOT 0.2*  PROT 8.3  ALBUMIN 4.0    Recent Labs Lab 06/19/12 1913  LIPASE  52   No results found for this basename: AMMONIA,  in the last 168 hours CBC:  Recent Labs Lab 06/19/12 1913 06/20/12 0445 06/21/12 0404  WBC 11.2* 11.0* 9.9  NEUTROABS 8.5*  --   --   HGB 13.0 11.7* 11.6*  HCT 38.1 33.4* 34.4*  MCV 89.0 88.4 90.1  PLT 243 219 231   Cardiac Enzymes: No  results found for this basename: CKTOTAL, CKMB, CKMBINDEX, TROPONINI,  in the last 168 hours BNP (last 3 results) No results found for this basename: PROBNP,  in the last 8760 hours CBG:  Recent Labs Lab 06/21/12 1133 06/21/12 1634 06/21/12 2215 06/22/12 0647 06/22/12 1152  GLUCAP 182* 156* 252* 134* 146*    No results found for this or any previous visit (from the past 240 hour(s)).   Studies: No results found. 2 D Echo  Study Conclusions  - Left ventricle: The cavity size was normal. Wall thickness was increased in a pattern of mild LVH. Systolic function was normal. The estimated ejection fraction was in the range of 60% to 65%. Wall motion was normal; there were no regional wall motion abnormalities. - Right atrium: The atrium was mildly dilated. - Tricuspid valve: Moderate regurgitation. - Pulmonary arteries: Systolic pressure was moderately increased. PA peak pressure: 53mm Hg (S).    Additional labs:   Scheduled Meds: . atenolol  50 mg Oral Daily  . atorvastatin  10 mg Oral q1800  . chlorthalidone  25 mg Oral Daily  . clopidogrel  75 mg Oral Q breakfast  . diltiazem  360 mg Oral Daily  . ezetimibe  10 mg Oral Daily  . hydrALAZINE  100 mg Oral Q8H  . insulin aspart  0-15 Units Subcutaneous TID WC  . losartan  100 mg Oral Daily  . ondansetron  4 mg Oral Q6H  . polyethylene glycol  17 g Oral BID  . sodium chloride  3 mL Intravenous Q12H   Continuous Infusions:   Principal Problem:   CVA (cerebral infarction) Active Problems:   HYPERTENSION   Numbness   Diabetes mellitus   Hypokalemia    Time spent: 35 minutes    [facility]  [facility] Pager [phone].   If 8PM-8AM, please contact night-coverage at [email or web address], password [email or web address] 06/22/2012, 1:59 PM  LOS: 2 days

## 2012-06-23 ENCOUNTER — Inpatient Hospital Stay (HOSPITAL_COMMUNITY)
Admission: RE | Admit: 2012-06-23 | Discharge: 2012-06-28 | DRG: 945 | Disposition: A | Discharge status: Home Health | Payer: PRIVATE HEALTH INSURANCE | Source: Intra-hospital | Attending: Physical Medicine & Rehabilitation | Admitting: Physical Medicine & Rehabilitation

## 2012-06-23 ENCOUNTER — Inpatient Hospital Stay (HOSPITAL_COMMUNITY): Payer: PRIVATE HEALTH INSURANCE

## 2012-06-23 ENCOUNTER — Encounter (HOSPITAL_COMMUNITY): Payer: Self-pay | Admitting: *Deleted

## 2012-06-23 DIAGNOSIS — Z79899 Other long term (current) drug therapy: Secondary | ICD-10-CM

## 2012-06-23 DIAGNOSIS — E1142 Type 2 diabetes mellitus with diabetic polyneuropathy: Secondary | ICD-10-CM | POA: Diagnosis not present

## 2012-06-23 DIAGNOSIS — K5900 Constipation, unspecified: Secondary | ICD-10-CM

## 2012-06-23 DIAGNOSIS — I6330 Cerebral infarction due to thrombosis of unspecified cerebral artery: Secondary | ICD-10-CM

## 2012-06-23 DIAGNOSIS — Z5189 Encounter for other specified aftercare: Secondary | ICD-10-CM | POA: Diagnosis present

## 2012-06-23 DIAGNOSIS — Z602 Problems related to living alone: Secondary | ICD-10-CM | POA: Diagnosis not present

## 2012-06-23 DIAGNOSIS — IMO0001 Reserved for inherently not codable concepts without codable children: Secondary | ICD-10-CM

## 2012-06-23 DIAGNOSIS — Z886 Allergy status to analgesic agent status: Secondary | ICD-10-CM | POA: Diagnosis not present

## 2012-06-23 DIAGNOSIS — E1149 Type 2 diabetes mellitus with other diabetic neurological complication: Secondary | ICD-10-CM | POA: Diagnosis not present

## 2012-06-23 DIAGNOSIS — Z888 Allergy status to other drugs, medicaments and biological substances status: Secondary | ICD-10-CM

## 2012-06-23 DIAGNOSIS — E1165 Type 2 diabetes mellitus with hyperglycemia: Secondary | ICD-10-CM

## 2012-06-23 DIAGNOSIS — R29898 Other symptoms and signs involving the musculoskeletal system: Secondary | ICD-10-CM | POA: Diagnosis not present

## 2012-06-23 DIAGNOSIS — I10 Essential (primary) hypertension: Secondary | ICD-10-CM

## 2012-06-23 DIAGNOSIS — G4700 Insomnia, unspecified: Secondary | ICD-10-CM

## 2012-06-23 DIAGNOSIS — Z7902 Long term (current) use of antithrombotics/antiplatelets: Secondary | ICD-10-CM

## 2012-06-23 DIAGNOSIS — E785 Hyperlipidemia, unspecified: Secondary | ICD-10-CM | POA: Diagnosis not present

## 2012-06-23 LAB — GLUCOSE, CAPILLARY
Glucose-Capillary: 145 mg/dL — ABNORMAL HIGH (ref 70–99)
Glucose-Capillary: 210 mg/dL — ABNORMAL HIGH (ref 70–99)
Glucose-Capillary: 118 mg/dL — ABNORMAL HIGH (ref 70–99)
Glucose-Capillary: 219 mg/dL — ABNORMAL HIGH (ref 70–99)

## 2012-06-23 MED ORDER — ATORVASTATIN CALCIUM 10 MG PO TABS
10.0000 mg | ORAL_TABLET | Freq: Every day | ORAL | Status: DC
  Administered 2012-06-24 – 2012-06-27 (×4): 10 mg via ORAL
  Filled 2012-06-23 (×5): qty 1

## 2012-06-23 MED ORDER — CLOPIDOGREL BISULFATE 75 MG PO TABS
75.0000 mg | ORAL_TABLET | Freq: Every day | ORAL | Status: DC
  Administered 2012-06-24 – 2012-06-28 (×5): 75 mg via ORAL
  Filled 2012-06-23 (×6): qty 1

## 2012-06-23 MED ORDER — SENNA 8.6 MG PO TABS
2.0000 | ORAL_TABLET | Freq: Every day | ORAL | Status: DC
  Administered 2012-06-24 – 2012-06-28 (×5): 17.2 mg via ORAL
  Filled 2012-06-23 (×4): qty 2
  Filled 2012-06-23: qty 1
  Filled 2012-06-23: qty 2

## 2012-06-23 MED ORDER — ONDANSETRON HCL 4 MG PO TABS: 4.0000 mg | ORAL_TABLET | Freq: Four times a day (QID) | ORAL | Status: DC | PRN

## 2012-06-23 MED ORDER — SORBITOL 70 % SOLN: 30.0000 mL | Freq: Every day | Status: DC | PRN

## 2012-06-23 MED ORDER — ATENOLOL 50 MG PO TABS
50.0000 mg | ORAL_TABLET | Freq: Every day | ORAL | Status: DC
  Administered 2012-06-24 – 2012-06-28 (×5): 50 mg via ORAL
  Filled 2012-06-23 (×6): qty 1

## 2012-06-23 MED ORDER — POLYETHYLENE GLYCOL 3350 17 G PO PACK
17.0000 g | PACK | Freq: Two times a day (BID) | ORAL | Status: DC
  Administered 2012-06-24 – 2012-06-28 (×8): 17 g via ORAL
  Filled 2012-06-23 (×12): qty 1

## 2012-06-23 MED ORDER — SENNA 8.6 MG PO TABS: 2.0000 | ORAL_TABLET | Freq: Every day | ORAL | Status: DC

## 2012-06-23 MED ORDER — INSULIN ASPART 100 UNIT/ML ~~LOC~~ SOLN
0.0000 [IU] | Freq: Three times a day (TID) | SUBCUTANEOUS | Status: DC
  Administered 2012-06-24: 3 [IU] via SUBCUTANEOUS
  Administered 2012-06-24 – 2012-06-25 (×4): 2 [IU] via SUBCUTANEOUS
  Administered 2012-06-25 – 2012-06-26 (×2): 3 [IU] via SUBCUTANEOUS
  Administered 2012-06-26: 08:00:00 via SUBCUTANEOUS
  Administered 2012-06-26 – 2012-06-27 (×3): 2 [IU] via SUBCUTANEOUS
  Administered 2012-06-27: 3 [IU] via SUBCUTANEOUS
  Administered 2012-06-28: 2 [IU] via SUBCUTANEOUS

## 2012-06-23 MED ORDER — PANTOPRAZOLE SODIUM 40 MG PO TBEC
40.0000 mg | DELAYED_RELEASE_TABLET | Freq: Every day | ORAL | Status: DC
  Administered 2012-06-24 – 2012-06-28 (×5): 40 mg via ORAL
  Filled 2012-06-23 (×5): qty 1

## 2012-06-23 MED ORDER — ATORVASTATIN CALCIUM 10 MG PO TABS: 10.0000 mg | ORAL_TABLET | Freq: Every day | ORAL | Status: DC

## 2012-06-23 MED ORDER — SIMETHICONE 80 MG PO CHEW
80.0000 mg | CHEWABLE_TABLET | Freq: Four times a day (QID) | ORAL | Status: DC | PRN
  Administered 2012-06-23 – 2012-06-25 (×3): 80 mg via ORAL
  Filled 2012-06-23 (×5): qty 1

## 2012-06-23 MED ORDER — HYDROCODONE-ACETAMINOPHEN 5-325 MG PO TABS
1.0000 | ORAL_TABLET | Freq: Four times a day (QID) | ORAL | Status: DC | PRN
  Administered 2012-06-24 – 2012-06-27 (×7): 1 via ORAL
  Filled 2012-06-23 (×7): qty 1

## 2012-06-23 MED ORDER — LOSARTAN POTASSIUM 50 MG PO TABS
100.0000 mg | ORAL_TABLET | Freq: Every day | ORAL | Status: DC
  Administered 2012-06-24 – 2012-06-28 (×5): 100 mg via ORAL
  Filled 2012-06-23 (×6): qty 2

## 2012-06-23 MED ORDER — PANTOPRAZOLE SODIUM 40 MG PO TBEC: 40.0000 mg | DELAYED_RELEASE_TABLET | Freq: Every day | ORAL | Status: DC

## 2012-06-23 MED ORDER — DILTIAZEM HCL ER COATED BEADS 360 MG PO CP24
360.0000 mg | ORAL_CAPSULE | Freq: Every day | ORAL | Status: DC
  Administered 2012-06-24 – 2012-06-28 (×5): 360 mg via ORAL
  Filled 2012-06-23 (×6): qty 1

## 2012-06-23 MED ORDER — CLOPIDOGREL BISULFATE 75 MG PO TABS: 75.0000 mg | ORAL_TABLET | Freq: Every day | ORAL | Status: DC

## 2012-06-23 MED ORDER — SIMETHICONE 80 MG PO CHEW: 80.0000 mg | CHEWABLE_TABLET | Freq: Four times a day (QID) | ORAL | Status: DC | PRN

## 2012-06-23 MED ORDER — ONDANSETRON HCL 4 MG/2ML IJ SOLN: 4.0000 mg | Freq: Four times a day (QID) | INTRAMUSCULAR | Status: DC | PRN

## 2012-06-23 MED ORDER — CHLORTHALIDONE 25 MG PO TABS
25.0000 mg | ORAL_TABLET | Freq: Every day | ORAL | Status: DC
  Administered 2012-06-24 – 2012-06-28 (×5): 25 mg via ORAL
  Filled 2012-06-23 (×6): qty 1

## 2012-06-23 MED ORDER — ACETAMINOPHEN 325 MG PO TABS
325.0000 mg | ORAL_TABLET | ORAL | Status: DC | PRN
  Administered 2012-06-24 – 2012-06-26 (×2): 650 mg via ORAL
  Filled 2012-06-23 (×2): qty 2

## 2012-06-23 MED ORDER — EZETIMIBE 10 MG PO TABS
10.0000 mg | ORAL_TABLET | Freq: Every day | ORAL | Status: DC
  Administered 2012-06-24 – 2012-06-28 (×5): 10 mg via ORAL
  Filled 2012-06-23 (×6): qty 1

## 2012-06-23 NOTE — Plan of Care (Signed)
Problem: Discharge Progression Outcomes Goal: Complications resolved/controlled Outcome: Completed/Met Date Met:  06/23/12 Abdominal Pain secondary to constipation per KUB resolved

## 2012-06-23 NOTE — Discharge Summary (Signed)
Physician Discharge Summary  [name] JYN:[id number] DOB: Oct 27, 1945 DOA: 06/20/2012  PCP: Dayna Barker  Admit date: 06/20/2012 Discharge date: 06/23/2012  Time spent: Greater than 30 minutes  Recommendations for Outpatient Follow-up:  1. Dr. Dayna Barker, PCP in 2 weeks 2. Dr. Raeanne Barry, Neurology in 2 months 3. Recommend OP GI consultation : Will need further evaluation of vague abdominal symptoms, anemia and FOBT +  Discharge Diagnoses:  Principal Problem:   CVA (cerebral infarction) Active Problems:   HYPERTENSION   Numbness   Diabetes mellitus   Hypokalemia   Discharge Condition: Improved & Stable  Diet recommendation: Diabetic and heart healthy diet  Filed Weights   06/20/12 0539  Weight: 70.1 kg (154 lb 8.7 oz)    History of present illness:  67 y.o. female with h/o HTN, ? chronic abdominal pain, DM2, who presents to the ED. While in the ED being worked up for her chronic abdominal pain (work up was unremarkable) the patient also gives a history of 1 day onset of numbness in her L arm, L leg, and R leg. She states there is no associated weakness. Symptoms have been persistent.  CT head in the ED was negative, neurology consulted and recommended admission to rule out CVA.  Hospital Course:  1. Acute right brain stroke (posterior circulation): Neurology consulted. Patient completed stroke workup. Continue Plavix for secondary stroke prevention. MRA brain: Negative. Carotid Dopplers: Right-40-59% ICA stenosis. Left no hemodynamically significant ICA stenosis. Echo: LVEF 60-65%. LDL 100. Start statins. CIR consulted and have accepted for inpatient rehabilitation. Patient continues to have intermittent numbness of left side of face, left upper extremity and both feet below the knees. Not sure how much of her lower extremity numbness are due to diabetes-related peripheral neuropathy-although she claims that she was recently diagnosed. 2. Diabetes mellitus type  2: Reasonably controlled inpatient. A1c-7.2-needs better control OP. Can followup with PCP. 3. Hypertension: Controlled. Continue losartan, chlorthalidone, atenolol, Cardizem and hydralazine.  4. Hypokalemia: Repleted 5. 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 Stable. Needs OP GI workup. 6. Vague abdominal discomfort/constipation: Recent CT abdomen on 5/22 was negative. Patient complains off wake abdominal symptoms/discomfort and specifically denied pain. She had occasional nausea but no vomiting. Repeat x-ray abdomen on 6/2 showed stool. Patient had a large BM after enema with improvement/resolution of symptoms. Continue bowel regimen, PPI for possible GERD and simethicone. Recommended OP GI consultation for possible EGD and long over due screening colonoscopy. Patient and family verbalized understanding.  Procedures:  None    Consultations:  Neurology  Rehabilitation M.D.  Discharge Exam:  Complaints: "Feel sick in the belly"-denies abdominal pain specifically. No nausea or vomiting. Passing flatus. Symptoms improved after large BM post enema.  Filed Vitals:   06/22/12 1900 06/22/12 2218 06/23/12 0538 06/23/12 1000  BP: 124/52 128/39 133/48 130/56  Pulse: 58 62 76 94  Temp: 98.3 F (36.8 C) 98.7 F (37.1 C) 98.1 F (36.7 C) 98.3 F (36.8 C)  TempSrc: Oral Oral Oral Oral  Resp: 18 18 17 17   Height:      Weight:      SpO2: 98% 96% 97% 98%     General exam: Comfortable.   Respiratory system: Clear. No increased work of breathing.   Cardiovascular system: S1 & S2 heard, RRR. No JVD, murmurs, gallops, clicks or pedal edema.  Gastrointestinal system: Abdomen is nondistended, soft and nontender. Normal bowel sounds heard.   Central nervous system: Alert and oriented. No focal neurological deficits.   Extremities: Symmetric 5 x 5 power.  Discharge Instructions      Discharge Orders   Future Appointments Provider Department Dept Phone   06/24/2012 8:00 AM Earle Gell, OT  [facility]  [address]  [place] [phone]   06/24/2012 9:30 AM Jackey Loge, PT [facility]  [address]  [place] [phone]   06/24/2012 10:30 AM Fredia Beets, OT [facility]  [address]  REH[facility] [phone]   06/24/2012 2:00 PM Jackey Loge, PT [facility]  [address]  REHAB [phone]   Future Orders Complete By Expires     Call MD for:  persistant nausea and vomiting  As directed     Call MD for:  severe uncontrolled pain  As directed     Diet - low sodium heart healthy  As directed     Diet Carb Modified  As directed     Increase activity slowly  As directed         Medication List    TAKE these medications       atenolol-chlorthalidone 50-25 MG per tablet  Commonly known as:  TENORETIC  Take 1 tablet by mouth daily.     atorvastatin 10 MG tablet  Commonly known as:  LIPITOR  Take 1 tablet (10 mg total) by mouth daily at 6 PM.     clopidogrel 75 MG tablet  Commonly known as:  PLAVIX  Take 1 tablet (75 mg total) by mouth daily with breakfast.     diltiazem 360 MG 24 hr capsule  Commonly known as:  CARDIZEM CD  Take 360 mg by mouth daily.     ezetimibe 10 MG tablet  Commonly known as:  ZETIA  Take 10 mg by mouth daily.     glipiZIDE-metformin 5-500 MG per tablet  Commonly known as:  METAGLIP  Take 1-2 tablets by mouth 2 (two) times daily before a meal. Takes 2 tablets in the morning and 1 tablet at bedtime     hydrALAZINE 100 MG tablet  Commonly known as:  APRESOLINE  Take 100 mg by mouth 3 (three) times daily.     losartan 100 MG tablet  Commonly known as:  COZAAR  Take 100 mg by mouth daily.     ondansetron 4 MG tablet  Commonly known as:  ZOFRAN  Take 1 tablet (4 mg total) by mouth every 6 (six) hours as needed for nausea.     pantoprazole 40 MG tablet  Commonly known as:  PROTONIX  Take 1 tablet (40 mg total) by mouth daily.     polyethylene glycol powder powder   Commonly known as:  GLYCOLAX/MIRALAX  Take 17 g by mouth 2 (two) times daily. Until daily soft stools    OTC     senna 8.6 MG Tabs  Commonly known as:  SENOKOT  Take 2 tablets (17.2 mg total) by mouth daily.     simethicone 80 MG chewable tablet  Commonly known as:  MYLICON  Chew 1 tablet (80 mg total) by mouth 4 (four) times daily as needed for flatulence.       Follow-up Information   Follow up with BEAUCHAMP,CHARLES. Schedule an appointment as soon as possible for a visit in 2 weeks.      Follow up with Gates Rigg, MD. Schedule an appointment as soon as possible for a visit in 2 months.   Contact information:   [address] [place] [address] [phone]  The results of significant diagnostics from this hospitalization (including imaging, microbiology, ancillary and laboratory) are listed below for reference.    Significant Diagnostic Studies: Ct Head Wo Contrast  06/20/2012   *RADIOLOGY REPORT*  Clinical Data: New onset of left arm and left leg numbness.  CT HEAD WITHOUT CONTRAST  Technique:  Contiguous axial images were obtained from the base of the skull through the vertex without contrast.  Comparison: None.  Findings: There is no evidence of acute infarction, mass lesion, or intra- or extra-axial hemorrhage on CT.  The posterior fossa, including the cerebellum, brainstem and fourth ventricle, is within normal limits.  The third and lateral ventricles, and basal ganglia are unremarkable in appearance.  The cerebral hemispheres are symmetric in appearance, with normal gray- white differentiation.  No mass effect or midline shift is seen.  There is no evidence of fracture; visualized osseous structures are unremarkable in appearance.  The visualized portions of the orbits are within normal limits.  The paranasal sinuses and mastoid air cells are well-aerated.  Cerumen is noted partially filling the external auditory canals bilaterally.   IMPRESSION:  1.  No acute intracranial pathology seen on CT. 2.  Cerumen noted partially filling the external auditory canals bilaterally.   Original Report Authenticated By: Tonia Ghent, M.D.   Mr [facility] Wo Contrast  06/20/2012   *RADIOLOGY REPORT*  Clinical Data:  CVA.  Diabetes and hypertension  MRI HEAD WITHOUT CONTRAST MRA HEAD WITHOUT CONTRAST  Technique:  Multiplanar, multiecho pulse sequences of the brain and surrounding structures were obtained without intravenous contrast. Angiographic images of the head were obtained using MRA technique without contrast.  Comparison:  CT head 06/20/2012  MRI HEAD  Findings:  Small focus of acute infarction in the splenium of the corpus callosum in the midline.  No other acute infarct.  Mild hyperintensity in the periventricular white matter, consistent with very mild chronic microvascular ischemia.  No cortical infarct.  Brainstem and cerebellum are intact.  Negative for intracranial hemorrhage.  No mass or edema.  Ventricle size is normal.  IMPRESSION: Small focus of acute infarction splenium of the corpus callosum. Mild chronic microvascular ischemic change in the white matter.  MRA HEAD  Findings: Both vertebral arteries are patent to the basilar.  Right PICA is patent.  Left PICA not visualized.  AICA, superior cerebellar, and posterior cerebral arteries are widely patent.  The basilar is widely patent.  The internal carotid artery is widely patent.  The anterior and middle cerebral arteries are widely patent without stenosis.  Negative for cerebral aneurysm.  IMPRESSION: Negative   Original Report Authenticated By: Janeece Riggers, M.D.   Ct Abdomen Pelvis W Contrast  06/12/2012   *RADIOLOGY REPORT*  Clinical Data: Abdominal pain.  Leukocytosis.  CT ABDOMEN AND PELVIS WITH CONTRAST  Technique:  Multidetector CT imaging of the abdomen and pelvis was performed following the standard protocol during bolus administration of intravenous contrast.  Contrast:  OMNIPAQUE IOHEXOL 300 MG/ML  SOLN  Comparison: None.  Findings: The liver, gallbladder, pancreas, spleen, adrenal glands, and kidneys are normal in appearance.  No evidence of hydronephrosis.  No soft tissue masses or lymphadenopathy identified within the abdomen or pelvis.  Uterus is surgically absent.  Adnexal regions are unremarkable in appearance.  No evidence of inflammatory process or abnormal fluid collections.  No evidence of bowel wall thickening or obstruction. Large colonic stool burden noted.  No suspicious bone lesions identified peri  IMPRESSION:  1.  No acute findings. 2.  Large colonic stool burden noted;  suggest clinical correlation for possible constipation.   Original Report Authenticated By: Myles Rosenthal, M.D.   Dg Abd Acute W/chest  06/20/2012   *RADIOLOGY REPORT*  Clinical Data: Abdominal pain; left arm and leg numbness.  Poor appetite.  ACUTE ABDOMEN SERIES (ABDOMEN 2 VIEW & CHEST 1 VIEW)  Comparison: Chest and abdominal radiographs performed 07/09/2009, and CT of the abdomen and pelvis performed 06/10/2012  Findings: The lungs are well-aerated and clear.  There is no evidence of focal opacification, pleural effusion or pneumothorax. The cardiomediastinal silhouette is within normal limits.  The visualized bowel gas pattern is unremarkable.  Scattered stool and air are seen within the colon; there is no evidence of small bowel dilatation to suggest obstruction.  No free intra-abdominal air is identified on the provided upright view.  No acute osseous abnormalities are seen; the sacroiliac joints are unremarkable in appearance.  Mild degenerative change is noted at the lower lumbar spine.  IMPRESSION:  1.  Unremarkable bowel gas pattern; no free intra-abdominal air seen. 2.  No acute cardiopulmonary process identified.   Original Report Authenticated By: Tonia Ghent, M.D.   Dg Abd Portable 1v  06/23/2012   *RADIOLOGY REPORT*  Clinical Data: Abdominal discomfort, rule out constipation   PORTABLE ABDOMEN - 1 VIEW  Comparison: None.  Findings: There are gas-filled loops of large and small bowel which are not distended beyond normal limits.  There is a moderate volume of gas and stool in the rectum and sigmoid colon.  No pathologic calcifications. Degenerative changes of the lumbar spine  IMPRESSION: Moderate volume of stool in the rectosigmoid colon could represent mild constipation.  No evidence of obstruction.  Minimal stool in the remainder of the gas filled colon.   Original Report Authenticated By: Genevive Bi, M.D.    Microbiology: No results found for this or any previous visit (from the past 240 hour(s)).   Labs: Basic Metabolic Panel:  Recent Labs Lab 06/19/12 1913 06/20/12 0445 06/21/12 0404  NA 136 137 138  K 3.2* 3.2* 3.6  CL 95* 98 100  CO2 24 28 26   GLUCOSE 114* 130* 133*  BUN 19 18 18   CREATININE 1.11* 1.15* 1.11*  CALCIUM 9.7 8.9 8.7  MG  --   --  2.1   Liver Function Tests:  Recent Labs Lab 06/19/12 1913  AST 28  ALT 25  ALKPHOS 103  BILITOT 0.2*  PROT 8.3  ALBUMIN 4.0    Recent Labs Lab 06/19/12 1913  LIPASE 52   No results found for this basename: AMMONIA,  in the last 168 hours CBC:  Recent Labs Lab 06/19/12 1913 06/20/12 0445 06/21/12 0404  WBC 11.2* 11.0* 9.9  NEUTROABS 8.5*  --   --   HGB 13.0 11.7* 11.6*  HCT 38.1 33.4* 34.4*  MCV 89.0 88.4 90.1  PLT 243 219 231   Cardiac Enzymes: No results found for this basename: CKTOTAL, CKMB, CKMBINDEX, TROPONINI,  in the last 168 hours BNP: BNP (last 3 results) No results found for this basename: PROBNP,  in the last 8760 hours CBG:  Recent Labs Lab 06/22/12 1551 06/22/12 2220 06/23/12 0701 06/23/12 1148 06/23/12 1616  GLUCAP 171* 179* 145* 210* 118*    Additional labs:  Fasting lipid panel: Cholesterol 189, triglycerides 107, HDL 68, LDL 100 and VLDL 21\  Hemoglobin A1c: 7.2  FOBT: Positive   Signed:  Anyely Cunning  [facility] 06/23/2012,  4:36 PM

## 2012-06-23 NOTE — PMR Pre-admission (Signed)
PMR Admission Coordinator Pre-Admission Assessment  Patient: [name] is an 67 y.o., female MRN: [id number] DOB: 1945/07/11 Height: 5\' 4"  (162.6 cm) Weight: 70.1 kg (154 lb 8.7 oz)              Insurance Information HMO: yes    PPO:     PCP:      IPA:      80/20:      OTHER:  PRIMARY: [employer] Dual Complete      Policy#: [id number]      Subscriber: self CM Name:Zelda       Phone#:  [phone]    Fax#:  Pre-Cert#:  TBD     Employer:  none Benefits: [phone]        Phone #: [phone]     Name: [name]. Date: 01/23/12    Deduct:  $147     Out of Pocket Max:  $6700     Life Max: none CIR: $1216 per admission      SNF: $0: day 1-20 / $152: day 21-100 Outpatient: 80/20%     Co-Pay:  Home Health:       Co-Pay: $0 copay DME: 80/20%     Precert: [phone] Providers: in network  SECONDARY: Medicaid (confirmed 06/23/12)    Policy#: [id number]      Subscriber: self  Emergency Contact Information Contact Information   Name Relation Home Work Mobile   [name] Daughter [phone] (not working)     [name] Sister [phone]              Current Medical History  Patient Admitting Diagnosis: 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 right-handed female with history of hypertension as well as diabetes mellitus with peripheral neuropathy. Admitted 06/20/2012 with left-sided weakness. 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 MRA of the head negative. Echocardiogram with ejection fraction of 65% without emboli. 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 Patient did not receive TPA. Neurology services consulted placed on Plavix therapy for CVA prophylaxis. Physical and occupational therapy evaluations completed ongoing with recommendations of physical medicine rehabilitation consult to consider inpatient rehabilitation services.  Total: 0= [facility]   Past Medical  History  Past Medical History  Diagnosis Date  . Diabetes mellitus without complication   . Hypertension    Family History  family history is not on file.  Prior Rehab/Hospitalizations: none   Current Medications  Current facility-administered medications:atenolol (TENORMIN) tablet 50 mg, 50 mg, Oral, Daily, Hillary Bow, DO, 50 mg at 06/23/12 0957;  atorvastatin (LIPITOR) tablet 10 mg, 10 mg, Oral, q1800, Elease Etienne, MD, 10 mg at 06/22/12 1713;  chlorthalidone (HYGROTON) tablet 25 mg, 25 mg, Oral, Daily, Hillary Bow, DO, 25 mg at 06/23/12 0957 clopidogrel (PLAVIX) tablet 75 mg, 75 mg, Oral, Q breakfast, Ulice Dash, PA-C, 75 mg at 06/23/12 1884;  diltiazem (CARDIZEM CD) 24 hr capsule 360 mg, 360 mg, Oral, Daily, Hillary Bow, DO, 360 mg at 06/23/12 1660;  ezetimibe (ZETIA) tablet 10 mg, 10 mg, Oral, Daily, Hillary Bow, DO, 10 mg at 06/23/12 0957;  hydrALAZINE (APRESOLINE) tablet 100 mg, 100 mg, Oral, Q8H, Hillary Bow, DO, 100 mg at 06/23/12 1357 HYDROcodone-acetaminophen (NORCO/VICODIN) 5-325 MG per tablet 1 tablet, 1 tablet, Oral, Q6H PRN, Hillary Bow, DO, 1 tablet at 06/23/12 1005;  insulin aspart (novoLOG) injection 0-15 Units, 0-15 Units, Subcutaneous, TID WC, Jilda Panda  Electa Sniff, DO, 5 Units at 06/23/12 1209;  losartan (COZAAR) tablet 100 mg, 100 mg, Oral, Daily, Hillary Bow, DO, 100 mg at 06/23/12 0957 ondansetron [facility]) tablet 4 mg, 4 mg, Oral, Q6H, Hillary Bow, DO, 4 mg at 06/23/12 0816;  pantoprazole (PROTONIX) EC tablet 40 mg, 40 mg, Oral, Daily, Elease Etienne, MD, 40 mg at 06/23/12 0957;  polyethylene glycol (MIRALAX / GLYCOLAX) packet 17 g, 17 g, Oral, BID, Jared M. Gardner, DO, 17 g at 06/22/12 2337;  senna (SENOKOT) tablet 17.2 mg, 2 tablet, Oral, Daily, Elease Etienne, MD, 17.2 mg at 06/23/12 1610 simethicone (MYLICON) chewable tablet 80 mg, 80 mg, Oral, QID PRN, Elease Etienne, MD, 80 mg at 06/23/12 1005;  sodium chloride 0.9 %  injection 3 mL, 3 mL, Intravenous, Q12H, Jared M. Gardner, DO, 3 mL at 06/23/12 1012  Patients Current Diet: Carb Control  Precautions / Restrictions Precautions Precautions: Fall Restrictions Weight Bearing Restrictions: No   Prior Activity Level Limited Community (1-2x/wk): Goes to grocery store by bus. Home Assistive Devices / Equipment Home Assistive Devices/Equipment: None Home Adaptive Equipment: None  Prior Functional Level Prior Function Level of Independence: Independent Able to Take Stairs?: Yes Driving: No  Current Functional Level Cognition  Arousal/Alertness: Lethargic Overall Cognitive Status: Within Functional Limits for tasks assessed Orientation Level: Oriented X4    Extremity Assessment (includes Sensation/Coordination)  RUE ROM/Strength/Tone: WFL for tasks assessed RUE Sensation: WFL - Light Touch;WFL - Proprioception RUE Coordination: WFL - gross/fine motor  RLE ROM/Strength/Tone: Within functional levels RLE Sensation: Deficits RLE Sensation Deficits: pt reports tingling, worse in left leg compared to right.      ADLs  Grooming: Performed;Wash/dry hands;Wash/dry face;Minimal assistance Where Assessed - Grooming: Supported standing Upper Body Bathing: Simulated;Set up Where Assessed - Upper Body Bathing: Unsupported sitting Lower Body Bathing: Simulated;Min guard Where Assessed - Lower Body Bathing: Supported sit to stand Upper Body Dressing: Performed;Minimal assistance Where Assessed - Upper Body Dressing: Unsupported sitting Lower Body Dressing: Performed;Minimal assistance Where Assessed - Lower Body Dressing: Supported sit to stand Toilet Transfer: Simulated;Minimal assistance Toilet Transfer Method:  (ambulating) Acupuncturist: Other (comment) (bed to ambulate in hall then to chair in room) Equipment Used: Gait belt Transfers/Ambulation Related to ADLs: Min assist for ambulation. Pt c/o worsening pain and dizziness with  ambulation. Pt supported by placing her arm around pt's waist for minimal support. while ambulating in hall, PT arrived and joined for co-session. ADL Comments: Pt primarily limited by dizziness and pain. Became very labile when sitting EOB but able to calm down with emotional support.    Mobility  Bed Mobility: Supine to Sit;Sitting - Scoot to Edge of Bed Supine to Sit: 4: Min guard;HOB elevated;With rails Sitting - Scoot to Edge of Bed: 4: Min guard    Transfers  Transfers: Stand to Sit Sit to Stand: 4: Min assist;From bed;With upper extremity assist Stand to Sit: 4: Min assist;To chair/3-in-1;With armrests;With upper extremity assist    Ambulation / Gait / Stairs / Wheelchair Mobility  Ambulation/Gait Ambulation/Gait Assistance: 4: Min Environmental consultant (Feet): 120 Feet Assistive device: 1 person hand held assist Ambulation/Gait Assistance Details: pt supported at truk with her arm around the therapist.  Needing assist to feel steady during gait.  No functional weakneness noted (legs buckling, poor coordination of extremities).   Gait Pattern: Step-through pattern Gait velocity: less than 1.8 ft/sec indicating risk for recurrent falls.  General Gait Details:      Posture / Balance  Special needs/care consideration  Skin: WDL                              Bowel mgmt: Patient with GI discomfort. KUB indicated constipation. SSE on 6/2 with good results. Bladder mgmt: WDL Diabetic mgmt: yes    Previous Home Environment Living Arrangements: Alone Lives With: Alone Available Help at Discharge: Family;Available PRN/intermittently Type of Home: House Home Layout: One level Home Access: Stairs to enter Entrance Stairs-Rails: None (holds on to storm door) Secretary/administrator of Steps: 1 Bathroom Shower/Tub: Engineer, manufacturing systems: Standard Home Care Services: No  Discharge Living Setting Plans for Discharge Living Setting: Patient's home Type of Home at  Discharge: House Discharge Home Layout: One level Discharge Home Access: Stairs to enter Entrance Stairs-Rails: None Entrance Stairs-Number of Steps: 1 Discharge Bathroom Shower/Tub: Tub/shower unit Discharge Bathroom Toilet: Standard Discharge Bathroom Accessibility: Yes How Accessible: Accessible via walker Do you have any problems obtaining your medications?: No  Social/Family/Support Systems Patient Roles: Parent Contact Information: Home: [phone] Anticipated Caregiver: Daughters Anticipated Industrial/product designer Information: TBD Ability/Limitations of Caregiver: Min Assist Caregiver Availability: Intermittent Discharge Plan Discussed with Primary Caregiver: No (Spoke with pt's sister. Daughter's phone out of order.) Does Caregiver/Family have Issues with Lodging/Transportation while Pt is in Rehab?: Yes (Daughter relies on bus.)  Goals/Additional Needs Patient/Family Goal for Rehab: Supervision - min assistance Expected length of stay: 7 days Cultural Considerations: none Dietary Needs: Carb modified Equipment Needs: TBD Pt/Family Agrees to Admission and willing to participate: Yes Program Orientation Provided & Reviewed with Pt/Caregiver Including Roles  & Responsibilities: Yes  Decrease burden of Care through IP rehab admission: n/a  Possible need for SNF placement upon discharge: Patient refuses SNF. States that her daughters will help her.  Patient Condition: This patient's condition remains as documented in the consult dated 06/23/12, in which the Rehabilitation Physician determined and documented that the patient's condition is appropriate for intensive rehabilitative care in an inpatient rehabilitation facility. Will admit to inpatient rehab today.  Preadmission Screen Completed By:  Meryl Dare, 06/23/2012 3:42 PM ______________________________________________________________________   Discussed status with Dr. Riley Kill on 06/23/12 at 1600 and received  telephone approval for admission today.  Admission Coordinator:  Meryl Dare, time 2130 Dorna Bloom 06/23/12

## 2012-06-23 NOTE — Progress Notes (Signed)
Stroke Discharge teaching Manual "[facility]" given to pt's daughter Joesphine Bare.

## 2012-06-23 NOTE — Progress Notes (Signed)
Admitting patient to CIR today. Received insurance approval for CIR. Dr Waymon Amato reports pt stable for d/c to CIR. Pt is in agreement with plan. Unable to reach pt's daughter by phone, but did speak with pt's sister who was in agreement with plan for CIR. [phone]

## 2012-06-23 NOTE — H&P (Signed)
Physical Medicine and Rehabilitation Admission H&P  Chief Complaint   Patient presents with   .  Abdominal Pain   .  Numbness   :  HPI: [name] is a 67 y.o. right-handed female with history of hypertension as well as diabetes mellitus with peripheral neuropathy. Admitted 06/20/2012 with left-sided weakness. 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 MRA of the head negative. Echocardiogram with ejection fraction of 65% without emboli. 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 Patient did not receive TPA. Neurology services consulted placed on Plavix therapy for CVA prophylaxis. Physical and occupational therapy evaluations completed ongoing with recommendations of physical medicine rehabilitation consult to consider inpatient rehabilitation services. Patient was felt to be a good candidate for inpatient rehabilitation services and was admitted for comprehensive rehabilitation program  Review of Systems  Gastrointestinal: Positive for constipation.  Musculoskeletal: Positive for myalgias and joint pain.  All other systems reviewed and are negative  Past Medical History   Diagnosis  Date   .  Diabetes mellitus without complication    .  Hypertension     History reviewed. No pertinent past surgical history.  History reviewed. No pertinent family history.  Social History: reports that she does not drink alcohol or use illicit drugs. Her tobacco history is not on file.  Allergies:  Allergies   Allergen  Reactions   .  Codeine      REACTION: Hallucinations   .  Ibuprofen      REACTION: Elevated Blood Pressure    Medications Prior to Admission   Medication  Sig  Dispense  Refill   .  atenolol-chlorthalidone (TENORETIC) 50-25 MG per tablet  Take 1 tablet by mouth daily.     Marland Kitchen  diltiazem (CARDIZEM CD) 360 MG 24 hr capsule  Take 360 mg by mouth daily.     Marland Kitchen  ezetimibe (ZETIA) 10 MG tablet  Take 10 mg by mouth daily.     Marland Kitchen   glipiZIDE-metformin (METAGLIP) 5-500 MG per tablet  Take 1-2 tablets by mouth 2 (two) times daily before a meal. Takes 2 tablets in the morning and 1 tablet at bedtime     .  hydrALAZINE (APRESOLINE) 100 MG tablet  Take 100 mg by mouth 3 (three) times daily.     Marland Kitchen  losartan (COZAAR) 100 MG tablet  Take 100 mg by mouth daily.     .  ondansetron (ZOFRAN) 4 MG tablet  Take 1 tablet (4 mg total) by mouth every 6 (six) hours.  12 tablet  0   .  polyethylene glycol powder (GLYCOLAX/MIRALAX) powder  Take 17 g by mouth 2 (two) times daily. Until daily soft stools  OTC  255 g  0    Home:  Home Living  Lives With: Alone  Available Help at Discharge: Family;Available PRN/intermittently  Type of Home: House  Home Access: Stairs to enter  [employer] of Steps: 1  Entrance Stairs-Rails: None (holds on to storm door)  Home Layout: One level  Bathroom Shower/Tub: Medical sales representative: Standard  Home Adaptive Equipment: None  Functional History:  Prior Function  Driving: No  Functional Status:  Mobility:  Bed Mobility  Bed Mobility: Supine to Sit;Sitting - Scoot to Edge of Bed  Supine to Sit: 4: Min guard;HOB elevated;With rails  Sitting - Scoot to Edge of Bed: 4: Min guard  Transfers  Transfers: Stand to Sit  Sit to Stand: 4: Min assist;From bed;With  upper extremity assist  Stand to Sit: 4: Min assist;To chair/3-in-1;With armrests;With upper extremity assist  Ambulation/Gait  Ambulation/Gait Assistance: 4: Min assist  Ambulation Distance (Feet): 120 Feet  Assistive device: 1 person hand held assist  Ambulation/Gait Assistance Details: pt supported at truk with her arm around the therapist. Needing assist to feel steady during gait. No functional weakneness noted (legs buckling, poor coordination of extremities).  Gait Pattern: Step-through pattern  Gait velocity: less than 1.8 ft/sec indicating risk for recurrent falls.  General Gait Details:   ADL:  ADL  Grooming:  Performed;Wash/dry hands;Wash/dry face;Minimal assistance  Where Assessed - Grooming: Supported standing  Upper Body Bathing: Simulated;Set up  Where Assessed - Upper Body Bathing: Unsupported sitting  Lower Body Bathing: Simulated;Min guard  Where Assessed - Lower Body Bathing: Supported sit to stand  Upper Body Dressing: Performed;Minimal assistance  Where Assessed - Upper Body Dressing: Unsupported sitting  Lower Body Dressing: Performed;Minimal assistance  Where Assessed - Lower Body Dressing: Supported sit to stand  Toilet Transfer: Simulated;Minimal assistance  Toilet Transfer Method: (ambulating)  Acupuncturist: Other (comment) (bed to ambulate in hall then to chair in room)  Equipment Used: Gait belt  Transfers/Ambulation Related to ADLs: Min assist for ambulation. Pt c/o worsening pain and dizziness with ambulation. Pt supported by placing her arm around pt's waist for minimal support. while ambulating in hall, PT arrived and joined for co-session.  ADL Comments: Pt primarily limited by dizziness and pain. Became very labile when sitting EOB but able to calm down with emotional support.  Cognition:  Cognition  Overall Cognitive Status: Within Functional Limits for tasks assessed  Arousal/Alertness: Lethargic  Orientation Level: Oriented X4  Cognition  Arousal/Alertness: Lethargic  Behavior During Therapy: Anxious (emotional)  Overall Cognitive Status: Within Functional Limits for tasks assessed  Physical Exam:  Blood pressure 130/56, pulse 94, temperature 98.3 F (36.8 C), temperature source Oral, resp. rate 17, height 5\' 4"  (1.626 m), weight 70.1 kg (154 lb 8.7 oz), SpO2 98.00%.  Physical Exam  Vitals reviewed.  Constitutional: She is oriented to person, place, and time. She appears well-nourished. No distress.  HENT:  Head: Normocephalic and atraumatic.  Right Ear: External ear normal.  Left Ear: External ear normal.  Eyes: Conjunctivae and EOM are normal.  Pupils are equal, round, and reactive to light.  Neck: Normal range of motion. Neck supple. No JVD present. No tracheal deviation present. No thyromegaly present.  Cardiovascular: Normal rate, regular rhythm and normal heart sounds. Exam reveals no gallop and no friction rub.  No murmur heard.  Pulmonary/Chest: Effort normal and breath sounds normal. No respiratory distress. She has no wheezes. She has no rales. She exhibits no tenderness.  Abdominal: Soft. Bowel sounds are normal. She exhibits no distension and no mass. There is no tenderness. There is no rebound and no guarding.  Lymphadenopathy:  She has no cervical adenopathy.  Neurological: She is alert and oriented to person, place, and time.  Follows simple commands. Left hemi facial, arm, and leg sensory loss (1/2). Decreased FMC on this side as well. Mild pronator drift. Strength grossly 4+/5 on right and 4 to 4+ on left upper and 4/5 LLE proximal to distal. . Displayed basic insight and awareness.  Skin: Skin is warm and dry. She is not diaphoretic.  Psychiatric:  Very anxious, became tearful at times also    Results for orders placed during the hospital encounter of 06/20/12 (from the past 48 hour(s))   GLUCOSE, CAPILLARY Status: Abnormal  Collection Time    06/21/12 4:34 PM   Result  Value  Range    Glucose-Capillary  156 (*)  70 - 99 mg/dL    Comment 1  Documented in Chart     Comment 2  Notify RN    GLUCOSE, CAPILLARY Status: Abnormal    Collection Time    06/21/12 10:15 PM   Result  Value  Range    Glucose-Capillary  252 (*)  70 - 99 mg/dL   GLUCOSE, CAPILLARY Status: Abnormal    Collection Time    06/22/12 6:47 AM   Result  Value  Range    Glucose-Capillary  134 (*)  70 - 99 mg/dL   GLUCOSE, CAPILLARY Status: Abnormal    Collection Time    06/22/12 11:52 AM   Result  Value  Range    Glucose-Capillary  146 (*)  70 - 99 mg/dL   GLUCOSE, CAPILLARY Status: Abnormal    Collection Time    06/22/12 3:51 PM   Result   Value  Range    Glucose-Capillary  171 (*)  70 - 99 mg/dL   GLUCOSE, CAPILLARY Status: Abnormal    Collection Time    06/22/12 10:20 PM   Result  Value  Range    Glucose-Capillary  179 (*)  70 - 99 mg/dL   GLUCOSE, CAPILLARY Status: Abnormal    Collection Time    06/23/12 7:01 AM   Result  Value  Range    Glucose-Capillary  145 (*)  70 - 99 mg/dL   GLUCOSE, CAPILLARY Status: Abnormal    Collection Time    06/23/12 11:48 AM   Result  Value  Range    Glucose-Capillary  210 (*)  70 - 99 mg/dL    No results found.  Post Admission Physician Evaluation:  1. 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 2. Patient is admitted to receive collaborative, interdisciplinary care between the physiatrist, rehab nursing staff, and therapy team. 3. Patient's level of medical complexity and substantial therapy needs in context of that medical necessity cannot be provided at a lesser intensity of care such as a SNF. 4. Patient has experienced substantial functional loss from his/her baseline which was documented above under the "Functional History" and "Functional Status" headings. Judging by the patient's diagnosis, physical exam, and functional history, the patient has potential for functional progress which will result in measurable gains while on inpatient rehab. These gains will be of substantial and practical use upon discharge in facilitating mobility and self-care at the household level. 5. Physiatrist will provide 24 hour management of medical needs as well as oversight of the therapy plan/treatment and provide guidance as appropriate regarding the interaction of the two. 6. 24 hour rehab nursing will assist with bladder management, bowel management, safety, skin/wound care, disease management, medication administration and patient education and help integrate therapy concepts, techniques,education, etc. 7. PT will assess and treat for/with: Lower extremity strength, range of  motion, stamina, balance, functional mobility, safety, adaptive techniques and equipment, NMR, coping skills, education. Goals are: mod I. 8. OT will assess and treat for/with: ADL's, functional mobility, safety, upper extremity strength, adaptive techniques and equipment, NMR, coping skills, education. Goals are: mod I to supervision. 9. SLP will assess and treat for/with: n/a. Goals are: n/a. 10. Case Management and Social Worker will assess and treat for psychological issues and discharge planning. 11. Team conference will be held weekly to assess progress toward goals and to determine barriers to discharge. 12. Patient  will receive at least 3 hours of therapy per day at least 5 days per week. 13. ELOS: 7 days  Prognosis: excellent   Medical Problem List and Plan:  1. Thrombotic corpus colostrum infarct -plavix, stroke education 2. DVT Prophylaxis/Anticoagulation: SCDs. Monitor for any signs of DVT, mobilize with therapies  3. Pain Management: Hydrocodone as needed. Monitor with increased activity  4. Neuropsych: This patient is capable of making decisions on his/her own behalf. She does need positive reinforcement and egosupport from the team. 5. Hypertension. Tenormin 50 mg daily, chlorthalidone 25 mg daily, Cardizem CD 360 mg daily, Cozaar 100 mg daily. Monitor with increased activity and adjust regimen as needed. 6. Diabetes mellitus with peripheral neuropathy. Hemoglobin A1c 7.2. Presently with sliding scale insulin. Patient on Metaglip 5-500 2 in the Am and 1 one at bedtime prior to admission. Check blood sugars a.c. and at bedtime resume home regimen as tolerated and indicated. 7. Hyperlipidemia. Lipitor/Zetia            Ranelle Oyster, MD, [facility] Physical Medicine & Rehabilitation  06/23/2012

## 2012-06-23 NOTE — Plan of Care (Signed)
Overall Plan of Care [facility]) Patient Details Name: [name] MRN: [id number] DOB: 1945-11-13  Diagnosis:  Corpus callosum infarct  Co-morbidities: Chronic insomnia and anxiety,Diabetes mellitus without complication  .  Hypertension    Functional Problem List  Patient demonstrates impairments in the following areas: Balance, Behavior, Bowel, Medication Management, Motor, Pain, Safety and vestibular dysfunction  Basic ADL's: grooming, bathing, dressing and toileting Advanced ADL's: simple meal preparation, laundry and light housekeeping  Transfers:  bed mobility, bed to chair, toilet, tub/shower, car, furniture and floor Locomotion:  ambulation and stairs  Additional Impairments:  None  Anticipated Outcomes Item Anticipated Outcome  Eating/Swallowing    Basic self-care  Mod I to I  Tolieting  Independent  Bowel/Bladder  Pt will remain cont. Of bowel and bladder   Transfers  Mod I  Locomotion  Mod I x150', 12 stairs with one handrail mod I  Communication    Cognition    Pain  3 or less  Safety/Judgment    Other     Therapy Plan: PT Intensity: Minimum of 1-2 x/day ,45 to 90 minutes PT Frequency: 5 out of 7 days PT Duration Estimated Length of Stay: 5-7 days OT Intensity: Minimum of 1-2 x/day, 45 to 90 minutes OT Frequency: 5 out of 7 days OT Duration/Estimated Length of Stay: 5-7 days      Team Interventions: Item RN PT OT SLP SW TR Other  Self Care/Advanced ADL Retraining   x      Neuromuscular Re-Education  x x      Therapeutic Activities  x x      UE/LE Strength Training/ROM  x x      UE/LE Coordination Activities  x x      Visual/Perceptual Remediation/Compensation         DME/Adaptive Equipment Instruction  x x      Therapeutic Exercise  x x      Balance/Vestibular Training  x x      Patient/Family Education x x x      Cognitive Remediation/Compensation  x x      Functional Mobility Training  x x      Ambulation/Gait Training  x        Museum/gallery curator  x       Wheelchair Propulsion/Positioning  x       Functional Tourist information centre manager Reintegration  x       Dysphagia/Aspiration Film/video editor         Bladder Management         Bowel Management x        Disease Management/Prevention x x       Pain Management x x       Medication Management x        Skin Care/Wound Management         Splinting/Orthotics         Discharge Planning  x x      Psychosocial Support  x                              Team Discharge Planning: Destination: PT-Home ,OT- Home , SLP-  Projected Follow-up: PT-Home health PT;Outpatient PT, OT-   (To be determined), SLP-  Projected Equipment Needs: PT-Cane;Rolling walker with 5" wheels, OT- Tub/shower seat, SLP-  Patient/family involved in discharge planning: PT- Patient,  OT-Patient, SLP-   MD ELOS: One week Medical Rehab Prognosis:  Good Assessment: 67 year old female admitted for CVA now requiring CIR level PT and OT. Requires 24 7 rehabilitation RN and M.D. Goals are for modified independent level mobility and ADLs. Anticipated short length of stay.    See Team Conference Notes for weekly updates to the plan of care

## 2012-06-23 NOTE — Progress Notes (Signed)
Pt arrived to rehab at 1745 with daughters at bedside. Rehab booklet given to pt and family. Reviewed process and schedule with verbal understanding. Safety plan signed and reviewed. Pt resting with call bell in reach. Daughters at bedside.

## 2012-06-23 NOTE — Progress Notes (Signed)
Met with patient at bedside to discuss possible CIR admission. Pt would benefit from inpatient rehab prior to discharge. Pt reports that she lives alone and hopes to regain independence. Pt has 2 daughters in town. Unable to reach dtrs by phone. Have requested auth for CIR from pt's insurance and await notification. For questions, call [phone].

## 2012-06-23 NOTE — Progress Notes (Signed)
Physical Therapy Treatment Patient Details Name: [name] MRN: [id number] DOB: 1945-11-07 Today's Date: 06/23/2012 Time: 4098-1191 PT Time Calculation (min): 14 min  PT Assessment / Plan / Recommendation Comments on Treatment Session  67 y.o. female admitted to [facility] for chronic abdominal pain, left arm and bil leg numbness and weakness.  MRI confirms a small focus of acute infarction in the splenium of the corpus callosum.  She presents today with quickly fluctuating emotional state expecially around her family.  She is laughing and joking with staff one minute and then speaks on the phone or even sees her family coming down the hallway the next minute and starts hystarically crying stating "I don't feel good" within a 3 minute window.  She is requiring less assistance with gait and was able to go up and down the stairs today with very little assistance.  She may need a neuro psych eval when she gets to inpatient rehab if they notice the same pattern of emotional fluctuations to such extremes.      Follow Up Recommendations  CIR     Does the patient have the potential to tolerate intense rehabilitation    yes  Barriers to Discharge   no 24 hour assist at discharge      Equipment Recommendations  None recommended by PT    Recommendations for Other Services   none  Frequency Min 4X/week   Plan Discharge plan remains appropriate;Frequency remains appropriate    Precautions / Restrictions Precautions Precautions: Fall   Pertinent Vitals/Pain See vitals flow sheet.     Mobility  Bed Mobility Bed Mobility: Sitting - Scoot to Edge of Bed;Supine to Sit Supine to Sit: 5: Supervision;With rails;HOB elevated Sitting - Scoot to Edge of Bed: 5: Supervision;With rail Details for Bed Mobility Assistance: supervision for safety Transfers Transfers: Sit to Stand;Stand to Sit Sit to Stand: 5: Supervision;With upper extremity assist;From bed Stand to Sit: 5: Supervision;With upper extremity  assist;To bed;To chair/3-in-1 Details for Transfer Assistance: supervision for safety Ambulation/Gait Ambulation/Gait Assistance: 4: Min guard Ambulation Distance (Feet): 150 Feet Assistive device: 1 person hand held assist Ambulation/Gait Assistance Details: pt holding to therapist for stability around therapist's upper back.  Slow gait speed.   Gait Pattern: Step-through pattern (mildly staggering) Gait velocity: less than 1.8 ft/sec indicating risk for recurrent falls.  Stairs: Yes Stairs Assistance: 4: Min guard Stair Management Technique: Two rails;Alternating pattern;Forwards Number of Stairs: 5      PT Goals Acute Rehab PT Goals PT Goal: Supine/Side to Sit - Progress: Progressing toward goal PT Goal: Sit to Stand - Progress: Progressing toward goal PT Goal: Stand to Sit - Progress: Progressing toward goal PT Goal: Ambulate - Progress: Progressing toward goal PT Goal: Up/Down Stairs - Progress: Progressing toward goal  Visit Information  Last PT Received On: 06/23/12 Assistance Needed: +1    Subjective Data  Subjective: Pt crying uncontrollably at times during tx session, then like a lightswitch goes back to laughing.  Both instances were when talking on the phone with family and when she saw family coming down the hallway. Her reasoning behind the uncontrollable crying, "I don't feel good." Strange presentation.     Cognition  Cognition Arousal/Alertness: Awake/alert Behavior During Therapy:  (very odd emotional/psych presentation ) Overall Cognitive Status: Impaired/Different from baseline Area of Impairment:  (emotionally hypersensative?  Recommend Neuropsych consult )       End of Session PT - End of Session Activity Tolerance: Patient limited by fatigue Patient left: in chair;with  call bell/phone within reach;with family/visitor present Nurse Communication: Other (comment);Mobility status (emotional outburst- RN thought was odd as well)     Lurena Joiner B.  Zriyah Kopplin, PT, DPT [phone]   06/23/2012, 5:32 PM

## 2012-06-23 NOTE — Progress Notes (Signed)
Rehab Admissions Coordinator Note:  Patient was screened by Clois Dupes for appropriateness for an Inpatient Acute Rehab Consult. Rehab consult is pending today.  Clois Dupes 06/23/2012, 7:56 AM  I can be reached at [phone].

## 2012-06-23 NOTE — Progress Notes (Signed)
Called report to Melville, RN on [phone]. Pt transferred out via wheelchair awake, alert, and in stable condition. Daughters present at the time of transfer and accompanied to room 4144.

## 2012-06-23 NOTE — Consult Note (Signed)
Physical Medicine and Rehabilitation Consult Reason for Consult: CVA Referring Physician: [facility]   HPI: [name] is a 67 y.o. right-handed female with history of hypertension as well as diabetes mellitus with peripheral neuropathy. Admitted 06/20/2012 with left-sided weakness. 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 MRA of the head negative. Echocardiogram with ejection fraction of 65% without emboli. 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 Patient did not receive TPA. Neurology services consulted placed on Plavix therapy for CVA prophylaxis. Physical and occupational therapy evaluations completed ongoing with recommendations of physical medicine rehabilitation consult to consider inpatient rehabilitation services.   Review of Systems  Gastrointestinal: Positive for constipation.  Musculoskeletal: Positive for myalgias and joint pain.  All other systems reviewed and are negative.   Past Medical History  Diagnosis Date  . Diabetes mellitus without complication   . Hypertension    History reviewed. No pertinent past surgical history. History reviewed. No pertinent family history. Social History:  reports that she does not drink alcohol or use illicit drugs. Her tobacco history is not on file. Allergies:  Allergies  Allergen Reactions  . Codeine     REACTION: Hallucinations  . Ibuprofen     REACTION: Elevated Blood Pressure   Medications Prior to Admission  Medication Sig Dispense Refill  . atenolol-chlorthalidone (TENORETIC) 50-25 MG per tablet Take 1 tablet by mouth daily.      Marland Kitchen diltiazem (CARDIZEM CD) 360 MG 24 hr capsule Take 360 mg by mouth daily.      Marland Kitchen ezetimibe (ZETIA) 10 MG tablet Take 10 mg by mouth daily.      Marland Kitchen glipiZIDE-metformin (METAGLIP) 5-500 MG per tablet Take 1-2 tablets by mouth 2 (two) times daily before a meal. Takes 2 tablets in the morning and 1 tablet at bedtime      . hydrALAZINE (APRESOLINE) 100  MG tablet Take 100 mg by mouth 3 (three) times daily.      Marland Kitchen losartan (COZAAR) 100 MG tablet Take 100 mg by mouth daily.      . ondansetron (ZOFRAN) 4 MG tablet Take 1 tablet (4 mg total) by mouth every 6 (six) hours.  12 tablet  0  . polyethylene glycol powder (GLYCOLAX/MIRALAX) powder Take 17 g by mouth 2 (two) times daily. Until daily soft stools  OTC  255 g  0    Home: Home Living Lives With: Alone Available Help at Discharge: Family;Available PRN/intermittently Type of Home: House Home Access: Stairs to enter [employer] of Steps: 1 Entrance Stairs-Rails: None (holds on to storm door) Home Layout: One level Bathroom Shower/Tub: Engineer, manufacturing systems: Standard Home Adaptive Equipment: None  Functional History: Prior Function Driving: No Functional Status:  Mobility: Bed Mobility Bed Mobility: Supine to Sit;Sitting - Scoot to Edge of Bed Supine to Sit: 4: Min guard;HOB elevated;With rails Sitting - Scoot to Edge of Bed: 4: Min guard Transfers Transfers: Stand to Sit Sit to Stand: 4: Min assist;From bed;With upper extremity assist Stand to Sit: 4: Min assist;To chair/3-in-1;With armrests;With upper extremity assist Ambulation/Gait Ambulation/Gait Assistance: 4: Min assist Ambulation Distance (Feet): 120 Feet Assistive device: 1 person hand held assist Ambulation/Gait Assistance Details: pt supported at truk with her arm around the therapist.  Needing assist to feel steady during gait.  No functional weakneness noted (legs buckling, poor coordination of extremities).   Gait Pattern: Step-through pattern Gait velocity: less than 1.8 ft/sec indicating risk for recurrent falls.  General Gait Details:  ADL: ADL Grooming: Performed;Wash/dry hands;Wash/dry face;Minimal assistance Where Assessed - Grooming: Supported standing Upper Body Bathing: Simulated;Set up Where Assessed - Upper Body Bathing: Unsupported sitting Lower Body Bathing:  Simulated;Min guard Where Assessed - Lower Body Bathing: Supported sit to stand Upper Body Dressing: Performed;Minimal assistance Where Assessed - Upper Body Dressing: Unsupported sitting Lower Body Dressing: Performed;Minimal assistance Where Assessed - Lower Body Dressing: Supported sit to stand Toilet Transfer: Simulated;Minimal assistance Toilet Transfer Method:  (ambulating) Acupuncturist: Other (comment) (bed to ambulate in hall then to chair in room) Equipment Used: Gait belt Transfers/Ambulation Related to ADLs: Min assist for ambulation. Pt c/o worsening pain and dizziness with ambulation. Pt supported by placing her arm around pt's waist for minimal support. while ambulating in hall, PT arrived and joined for co-session. ADL Comments: Pt primarily limited by dizziness and pain. Became very labile when sitting EOB but able to calm down with emotional support.  Cognition: Cognition Overall Cognitive Status: Within Functional Limits for tasks assessed Arousal/Alertness: Lethargic Orientation Level: Oriented X4 Cognition Arousal/Alertness: Lethargic Behavior During Therapy: Anxious (emotional) Overall Cognitive Status: Within Functional Limits for tasks assessed  Blood pressure 128/39, pulse 62, temperature 98.7 F (37.1 C), temperature source Oral, resp. rate 18, height 5\' 4"  (1.626 m), weight 70.1 kg (154 lb 8.7 oz), SpO2 96.00%. Physical Exam  Vitals reviewed. Constitutional: She is oriented to person, place, and time. She appears well-nourished. No distress.  HENT:  Head: Normocephalic and atraumatic.  Right Ear: External ear normal.  Left Ear: External ear normal.  Eyes: Conjunctivae and EOM are normal. Pupils are equal, round, and reactive to light.  Neck: Normal range of motion. Neck supple. No JVD present. No tracheal deviation present. No thyromegaly present.  Cardiovascular: Normal rate, regular rhythm and normal heart sounds.  Exam reveals no gallop and  no friction rub.   No murmur heard. Pulmonary/Chest: Effort normal and breath sounds normal. No respiratory distress. She has no wheezes. She has no rales. She exhibits no tenderness.  Abdominal: Soft. Bowel sounds are normal. She exhibits no distension and no mass. There is no tenderness. There is no rebound and no guarding.  Lymphadenopathy:    She has no cervical adenopathy.  Neurological: She is alert and oriented to person, place, and time.  Follows simple commands. Left hemi facial, arm, and leg numbness. Decreased FMC on this side as well. Strength grossly 4+/5 on right and 4 to 4+ on left. Displayed basic insight and awareness.   Skin: Skin is warm and dry. She is not diaphoretic.  Psychiatric:  Very anxious, became tearful at times also.    Results for orders placed during the hospital encounter of 06/20/12 (from the past 24 hour(s))  GLUCOSE, CAPILLARY     Status: Abnormal   Collection Time    06/22/12  6:47 AM      Result Value Range   Glucose-Capillary 134 (*) 70 - 99 mg/dL  GLUCOSE, CAPILLARY     Status: Abnormal   Collection Time    06/22/12 11:52 AM      Result Value Range   Glucose-Capillary 146 (*) 70 - 99 mg/dL  GLUCOSE, CAPILLARY     Status: Abnormal   Collection Time    06/22/12  3:51 PM      Result Value Range   Glucose-Capillary 171 (*) 70 - 99 mg/dL  GLUCOSE, CAPILLARY     Status: Abnormal   Collection Time    06/22/12 10:20 PM      Result Value  Range   Glucose-Capillary 179 (*) 70 - 99 mg/dL   No results found.  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 Does the need for close, 24 hr/day medical supervision in concert with the patient's rehab needs make it unreasonable for this patient to be served in a less intensive setting? Yes 2. Co-Morbidities requiring supervision/potential complications: htn, dm, depression 3. Due to bladder management, bowel management, safety, skin/wound care,  disease management, medication administration, pain management and patient education, does the patient require 24 hr/day rehab nursing? Yes 4. Does the patient require coordinated care of a physician, rehab nurse, PT (1-2 hrs/day, 5 days/week) and OT (1-2 hrs/day, 5 days/week) to address physical and functional deficits in the context of the above medical diagnosis(es)? Yes Addressing deficits in the following areas: balance, endurance, locomotion, strength, transferring, bowel/bladder control, bathing, dressing, feeding, grooming, toileting and psychosocial support 5. Can the patient actively participate in an intensive therapy program of at least 3 hrs of therapy per day at least 5 days per week? Yes 6. The potential for patient to make measurable gains while on inpatient rehab is good 7. Anticipated functional outcomes upon discharge from inpatient rehab are sup to mod I with PT, sup to mod I with OT, n/a with SLP. 8. Estimated rehab length of stay to reach the above functional goals is: 7+ days 9. Does the patient have adequate social supports to accommodate these discharge functional goals? Potentially---can daughter provide further assist if needed? 10. Anticipated D/C setting: Home 11. Anticipated post D/C treatments: HH therapy 12. Overall Rehab/Functional Prognosis: excellent  RECOMMENDATIONS: This patient's condition is appropriate for continued rehabilitative care in the following setting: CIR Patient has agreed to participate in recommended program. Potentially Note that insurance prior authorization may be required for reimbursement for recommended care.  Comment:Rehab RN to follow up.   Ranelle Oyster, MD, [place] [facility]     06/23/2012

## 2012-06-24 ENCOUNTER — Inpatient Hospital Stay (HOSPITAL_COMMUNITY): Payer: PRIVATE HEALTH INSURANCE | Admitting: Occupational Therapy

## 2012-06-24 ENCOUNTER — Inpatient Hospital Stay (HOSPITAL_COMMUNITY): Payer: PRIVATE HEALTH INSURANCE | Admitting: *Deleted

## 2012-06-24 DIAGNOSIS — I6330 Cerebral infarction due to thrombosis of unspecified cerebral artery: Secondary | ICD-10-CM

## 2012-06-24 DIAGNOSIS — IMO0001 Reserved for inherently not codable concepts without codable children: Secondary | ICD-10-CM

## 2012-06-24 DIAGNOSIS — I69993 Ataxia following unspecified cerebrovascular disease: Secondary | ICD-10-CM

## 2012-06-24 DIAGNOSIS — E1165 Type 2 diabetes mellitus with hyperglycemia: Secondary | ICD-10-CM

## 2012-06-24 LAB — COMPREHENSIVE METABOLIC PANEL
ALT: 21 U/L (ref 0–35)
AST: 16 U/L (ref 0–37)
Albumin: 3.2 g/dL — ABNORMAL LOW (ref 3.5–5.2)
Alkaline Phosphatase: 110 U/L (ref 39–117)
BUN: 18 mg/dL (ref 6–23)
CO2: 32 mEq/L (ref 19–32)
Calcium: 8.9 mg/dL (ref 8.4–10.5)
Chloride: 96 mEq/L (ref 96–112)
Creatinine, Ser: 1.27 mg/dL — ABNORMAL HIGH (ref 0.50–1.10)
GFR calc Af Amer: 50 mL/min — ABNORMAL LOW (ref >90)
GFR calc non Af Amer: 43 mL/min — ABNORMAL LOW (ref >90)
Glucose, Bld: 147 mg/dL — ABNORMAL HIGH (ref 70–99)
Potassium: 3.5 mEq/L (ref 3.5–5.1)
Sodium: 136 mEq/L (ref 135–145)
Total Bilirubin: 0.3 mg/dL (ref 0.3–1.2)
Total Protein: 7.3 g/dL (ref 6.0–8.3)

## 2012-06-24 LAB — CBC WITH DIFFERENTIAL/PLATELET
Basophils Absolute: 0 10*3/uL (ref 0.0–0.1)
Basophils Relative: 0 % (ref 0–1)
Eosinophils Absolute: 0.1 10*3/uL (ref 0.0–0.7)
Eosinophils Relative: 1 % (ref 0–5)
HCT: 34.2 % — ABNORMAL LOW (ref 36.0–46.0)
Hemoglobin: 11.4 g/dL — ABNORMAL LOW (ref 12.0–15.0)
Lymphocytes Relative: 12 % (ref 12–46)
Lymphs Abs: 1.3 10*3/uL (ref 0.7–4.0)
MCH: 29.9 pg (ref 26.0–34.0)
MCHC: 33.3 g/dL (ref 30.0–36.0)
MCV: 89.8 fL (ref 78.0–100.0)
Monocytes Absolute: 0.7 10*3/uL (ref 0.1–1.0)
Monocytes Relative: 6 % (ref 3–12)
Neutro Abs: 8.6 10*3/uL — ABNORMAL HIGH (ref 1.7–7.7)
Neutrophils Relative %: 80 % — ABNORMAL HIGH (ref 43–77)
Platelets: 221 10*3/uL (ref 150–400)
RBC: 3.81 MIL/uL — ABNORMAL LOW (ref 3.87–5.11)
RDW: 13.3 % (ref 11.5–15.5)
WBC: 10.7 10*3/uL — ABNORMAL HIGH (ref 4.0–10.5)

## 2012-06-24 LAB — GLUCOSE, CAPILLARY
Glucose-Capillary: 137 mg/dL — ABNORMAL HIGH (ref 70–99)
Glucose-Capillary: 125 mg/dL — ABNORMAL HIGH (ref 70–99)
Glucose-Capillary: 167 mg/dL — ABNORMAL HIGH (ref 70–99)
Glucose-Capillary: 135 mg/dL — ABNORMAL HIGH (ref 70–99)

## 2012-06-24 MED ORDER — TEMAZEPAM 7.5 MG PO CAPS
7.5000 mg | ORAL_CAPSULE | Freq: Every day | ORAL | Status: DC
  Administered 2012-06-24 – 2012-06-25 (×2): 7.5 mg via ORAL
  Filled 2012-06-24 (×2): qty 1

## 2012-06-24 MED ORDER — GLUCERNA SHAKE PO LIQD
120.0000 mL | Freq: Three times a day (TID) | ORAL | Status: DC
  Administered 2012-06-24 – 2012-06-28 (×11): 120 mL via ORAL

## 2012-06-24 NOTE — Progress Notes (Signed)
Occupational Therapy Session Note  Patient Details  Name: [name] MRN: [id number] Date of Birth: 06-01-1945  Today's Date: 06/24/2012 Time: 4132-4401 Time Calculation (min): 10 min (missed 35 min due to abdominal discomfort and dizziness 9/10 reported)  Short Term Goals: Week 1:  OT Short Term Goal 1 (Week 1): STGs = LTGs  Skilled Therapeutic Interventions/Progress Updates:  Patient in bed upon arrival stating that she did not feel well.  Patient agreed to attempt out of bed for therapy.  Practiced supine to sit EOB in a slow and deliberate manner which patient reports helped to decrease her dizziness.  After sit EOB then stand for ~1 minute, patient reporting 9/10 dizziness and requested back to bed.  Patient apologizing for not able to participate.  Therapy Documentation Precautions:  Precautions Precautions: Fall Restrictions Weight Bearing Restrictions: No Pain: Abdominal discomfort (bowel issues), not rated  Therapy/Group: Individual Therapy  [name] 06/24/2012, 10:52 AM

## 2012-06-24 NOTE — Progress Notes (Signed)
Social Work Assessment and Plan Social Work Assessment and Plan  Patient Details  Name: [name] MRN: [id number] Date of Birth: 04-05-1945  Today's Date: 06/24/2012  Problem List:  Patient Active Problem List   Diagnosis Date Noted  . 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 Numbness 06/20/2012  . Diabetes mellitus 06/20/2012  . CVA (cerebral infarction) 06/20/2012  . Hypokalemia 06/20/2012  . URI 01/28/2009  . ARTHRALGIA 01/28/2009  . CHEST PAIN UNSPECIFIED 11/26/2008  . DEPRESSION 09/04/2006  . HYPERTENSION 09/04/2006  . GERD 09/04/2006  . MULTIPLE SITES, ARTHRITIS-ACUTE/CHRONIC 09/04/2006  . LOW BACK PAIN, CHRONIC 09/04/2006   Past Medical History:  Past Medical History  Diagnosis Date  . Diabetes mellitus without complication   . Hypertension    Past Surgical History: History reviewed. No pertinent past surgical history. Social History:  reports that she does not drink alcohol or use illicit drugs. Her tobacco history is not on file.  Family / Support Systems Marital Status: Widow/Widower Patient Roles: Parent;Other (Comment) (Sibling) Children: Doris-daughter [phone]-home  [phone]-cell Other Supports: Jean-daughter  [phone] Anticipated Caregiver: Daughter's and pt Ability/Limitations of Caregiver: Family working on discharge plan-awaiting pt's progress and goals Caregiver Availability: Other (Comment) (Discussing a plan) Family Dynamics: Pt has three children all she is close with.  She has nine siblings who she see's often.  Pt feels she has good family support and if eneded they will assist her.  Social History Preferred language: English Religion: Holiness Cultural Background: No issues Education: High School Read: Yes Write: Yes Employment Status: Retired Fish farm manager Issues: No issues Guardian/Conservator: None-according to MD pt is capable of making her own decisions   Abuse/Neglect Physical Abuse: Denies Verbal  Abuse: Denies Sexual Abuse: Denies Exploitation of patient/patient's resources: Denies Self-Neglect: Denies  Emotional Status Pt's affect, behavior adn adjustment status: Pt is able to explain her stroke and deficits.  She is encouraged by the progress she has made already and hopeful this will continue.  She has always been independent and wants to remain so.  She is used to being the one that assists others. Recent Psychosocial Issues: Other medical issues-but has been managing them Pyschiatric History: Hisotry of anxiety at times-she feels she is anxious now this has happened to her.  She has taken meds in the past and feel they have been helpful.  Will discuss with MD if feels she needs again.  Deferred depression screen due to her feeling poorly form her bowel issues and stomach bloating.  Will check back with. Substance Abuse History: No issues  Patient / Family Perceptions, Expectations & Goals Pt/Family understanding of illness & functional limitations: Pt is able to explain her stroke and deficits.  She feels she has made progress since being hospitalized and is encouraged by this.  She is glad to be on the rehab unit and hopes to become independent here. Premorbid pt/family roles/activities: Mother, Grandmother, Sister, retiree, Optometrist, etc Anticipated changes in roles/activities/participation: resume Pt/family expectations/goals: Pt states: " I want to be able to take care of myself, before I elave here."  " I will do what I need too to achieve this."  [employer]: None Premorbid Home Care/DME Agencies: None Transportation available at discharge: Family or uses the bus Resource referrals recommended: Support group (specify) (CVA Support group)  Discharge Planning Living Arrangements: Alone Support Systems: Children;Other relatives;Friends/neighbors;Church/faith community Type of Residence: Private residence Insurance Resources: [employer] (specify);Medicaid (specify county) (UHC-Medicare & [place] Medicaid) Financial Resources: Restaurant manager, fast food  Screen Referred: No Living Expenses: Own Money Management: Patient Do you have any problems obtaining your medications?: No Home Management: Self Patient/Family Preliminary Plans: Return home with daughter's to assist if needed.  Daughter's discussing a plan if Mom needs 24 hour care.  Pt cares for her 23 year old granddaughter on the weekends while daughter works.  Await team's eval and come up with a discharge plan. Social Work Anticipated Follow Up Needs: HH/OP;Support Group  Clinical Impression Pleasant smiling female who laughs and seems to enjoy life.  She is willing to work hard to achieve her independence here.  Supportive family who are willing to assist pt, need to know how much So can come up with a plan.  Pt appears to have good movement in her affected side and should do well here.   Lucy Chris 06/24/2012, 9:36 AM

## 2012-06-24 NOTE — Progress Notes (Signed)
Physical Therapy Session Note  Patient Details  Name: IYANNAH BLAKE MRN: [id number] Date of Birth: September 22, 1945  Today's Date: 06/24/2012 Time: 1400-1430 Time Calculation (min): 30 min  Short Term Goals: Week 1:  PT Short Term Goal 1 (Week 1): STGs=LTGs secondary to short LOS  Skilled Therapeutic Interventions/Progress Updates:    Patient received sitting edge of bed. Session focused on gait training, see details below. Patient with reports of needing to use restroom. Patient ambulates to bathroom with min guard and is able to manage lower body dressing. Patient left sitting on toilet with instruction to pull string when done. Patient instructed not to get up alone and verbalized understanding. RN notified and aware of patient status.  Therapy Documentation Precautions:  Precautions Precautions: Fall Restrictions Weight Bearing Restrictions: No Pain: Pain Assessment Pain Assessment: No/denies pain Pain Score: 0-No pain Locomotion : Ambulation Ambulation: Yes Ambulation/Gait Assistance: 4: Min guard;5: Supervision;4: Min assist Ambulation Distance (Feet): 155 Feet Assistive device: None Ambulation/Gait Assistance Details: Verbal cues for gait pattern;Verbal cues for precautions/safety;Tactile cues for weight shifting Ambulation/Gait Assistance Details: Patient instructed in gait training 155' x2 without AD in controlled environment. Patient requires min guard, progressing to supervision, however demonstrates one lateral LOB to the L, requiring min assist to maintain balance. Gait Gait Pattern: Impaired Gait Pattern: Step-through pattern;Narrow base of support   See FIM for current functional status  Therapy/Group: Individual Therapy  Chipper Herb. Demetress Tift, PT, DPT  06/24/2012, 4:12 PM

## 2012-06-24 NOTE — Evaluation (Signed)
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 Numbness 06/20/2012  . Diabetes mellitus 06/20/2012  . CVA (cerebral infarction) 06/20/2012  . Hypokalemia 06/20/2012  . URI 01/28/2009  . ARTHRALGIA 01/28/2009  . CHEST PAIN UNSPECIFIED 11/26/2008  . DEPRESSION 09/04/2006  . HYPERTENSION 09/04/2006  . GERD 09/04/2006  . MULTIPLE SITES, ARTHRITIS-ACUTE/CHRONIC 09/04/2006  . LOW BACK PAIN, CHRONIC 09/04/2006    Past Medical History:  Past Medical History  Diagnosis Date  . Diabetes mellitus without complication   . Hypertension    Past Surgical History: History reviewed. No pertinent past surgical history.  Assessment & Plan Clinical Impression:   [name] is a 67 y.o. right-handed female with history of hypertension as well as diabetes mellitus with peripheral neuropathy. Admitted 06/20/2012 with left-sided weakness. 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 MRA of the head negative. Echocardiogram with ejection fraction of 65% without emboli. 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 Patient did not receive TPA. Neurology services consulted placed on Plavix therapy for CVA prophylaxis  Patient transferred to CIR on 06/23/2012 .    Patient currently requires supervision with basic self-care skills and min with IADLs secondary to decreased coordination, central origin and decreased standing balance and hemiplegia.  Prior to hospitalization, patient was independent and living alone.  Patient will benefit from skilled intervention to  increase independence with basic self-care skills and increase level of independence with iADL prior to discharge home independently.  Anticipate patient will require intermittent supervision and follow up outpatient if needed.  OT - End of Session Endurance Deficit: No OT Assessment Rehab Potential: Good Barriers to Discharge: None OT Plan OT Intensity: Minimum of 1-2 x/day, 45 to 90 minutes OT Frequency: 5 out of 7 days OT Duration/Estimated Length of Stay: 5-7 days pending resolution of dizziness OT Treatment/Interventions: Balance/vestibular training;Discharge planning;Functional mobility training;Patient/family education;Self Care/advanced ADL retraining;Neuromuscular re-education;UE/LE Coordination activities;UE/LE Strength taining/ROM;Therapeutic Exercise;Therapeutic Activities OT Recommendation Recommendations for Other Services: Vestibular eval Patient destination: Home Follow Up Recommendations:  (To be determined) Equipment Recommended: Tub/shower seat   Skilled Therapeutic Intervention Pt seen for initial evaluation and ADL retraining of toileting, bathing, and dressing with a focus on balance and strength.  Pt only required supervision with basic self care, steady assist with ambulation into bathroom.  Pt has fairly good strength and coordination, but has severe symptoms of dizziness when standing.  At end of session, pt resting in recliner with call light in reach.  OT Evaluation Precautions/Restrictions  Precautions Precautions: Fall Restrictions Weight Bearing Restrictions: No Pain Pain Assessment Pain Assessment: No/denies pain Pain Score: 0-No pain Home Living/Prior Functioning Home Living Lives With: Alone Available Help at Discharge: Family;Available PRN/intermittently Type of Home: House Home Access: Stairs to enter [employer] of Steps: 1 (threshold) Entrance Stairs-Rails: None (pt reports holding onto door frame) Home Layout: One  level Bathroom Shower/Tub: Forensic scientist: Standard Bathroom Accessibility: Yes How Accessible: Accessible via walker Home Adaptive Equipment: None Prior Function Level of Independence: Independent with basic ADLs;Independent with homemaking with ambulation;Independent with gait;Independent with transfers Able to Take Stairs?: Yes Driving: No Vocation:  On disability ADL   supervision overall with self care Vision/Perception  Vision - History Baseline Vision: Wears glasses only for reading Patient Visual Report: No change from baseline Vision - Assessment Ocular Range of Motion: Within Functional Limits Tracking/Visual Pursuits: Able to track stimulus in all quads without difficulty Saccades: Within functional limits Visual Fields: No apparent deficits Additional Comments: Pt c/o dizziness with standing and ambulation. Perception Perception: Within Functional Limits Praxis Praxis: Intact  Cognition Overall Cognitive Status: Within Functional Limits for tasks assessed Arousal/Alertness: Awake/alert Orientation Level: Oriented X4 Awareness: Appears intact Safety/Judgment: Appears intact Sensation Sensation Light Touch:  (pt c/o left side numbness/ tingling) Stereognosis: Appears Intact Hot/Cold: Appears Intact Proprioception: Appears Intact Additional Comments: Proprioception intact B ankle and great toes. Coordination Gross Motor Movements are Fluid and Coordinated: Yes Fine Motor Movements are Fluid and Coordinated: No (decreased speed and accuracy w/ rapid alternating movements) Heel Shin Test: Slight dysmetria/ undershooting with L LE Motor  Motor Motor: Abnormal tone Motor - Skilled Clinical Observations: 2 beat clonus L LE Mobility    steady assist with ADL transfers Trunk/Postural Assessment  Cervical Assessment Cervical Assessment: Within Functional Limits Thoracic Assessment Thoracic Assessment: Within Functional Limits Lumbar  Assessment Lumbar Assessment: Within Functional Limits Postural Control Postural Control: Within Functional Limits  Balance Static Standing Balance Static Standing - Level of Assistance: 5: Stand by assistance Dynamic Standing Balance Dynamic Standing - Level of Assistance: 4: Min assist Extremity/Trunk Assessment RUE Assessment RUE Assessment: Within Functional Limits LUE Assessment LUE Assessment: Exceptions to [facility] LUE Strength LUE Overall Strength Comments: 4-/5 in shoulder, elbow, grasp.    FIM:  FIM - Eating Eating Activity: 6: More than reasonable amount of time FIM - Grooming Grooming Steps: Wash, rinse, dry face;Wash, rinse, dry hands;Oral care, brush teeth, clean dentures Grooming: 5: Supervision: safety issues or verbal cues FIM - Bathing Bathing Steps Patient Completed: Chest;Right Arm;Left Arm;Abdomen;Front perineal area;Buttocks;Right upper leg;Left upper leg;Right lower leg (including foot);Left lower leg (including foot) Bathing: 5: Supervision: Safety issues/verbal cues FIM - Upper Body Dressing/Undressing Upper body dressing/undressing steps patient completed: Thread/unthread right bra strap;Thread/unthread left bra strap;Hook/unhook bra;Thread/unthread right sleeve of pullover shirt/dresss;Thread/unthread left sleeve of pullover shirt/dress;Put head through opening of pull over shirt/dress;Pull shirt over trunk Upper body dressing/undressing: 5: Supervision: Safety issues/verbal cues FIM - Lower Body Dressing/Undressing Lower body dressing/undressing steps patient completed: Thread/unthread right underwear leg;Thread/unthread left underwear leg;Pull underwear up/down;Thread/unthread right pants leg;Thread/unthread left pants leg;Pull pants up/down;Don/Doff right sock;Don/Doff left sock;Don/Doff right shoe;Fasten/unfasten pants;Don/Doff left shoe;Fasten/unfasten right shoe;Fasten/unfasten left shoe Lower body dressing/undressing: 5: Supervision: Safety issues/verbal  cues FIM - Toileting Toileting steps completed by patient: Adjust clothing prior to toileting;Performs perineal hygiene;Adjust clothing after toileting Toileting: 5: Supervision: Safety issues/verbal cues FIM - Bed/Chair Transfer Bed/Chair Transfer: 6: Supine > Sit: No assist;4: Bed > Chair or W/C: Min A (steadying Pt. > 75%) FIM - Toilet Transfers Toilet Transfers: 4-To toilet/BSC: Min A (steadying Pt. > 75%);4-From toilet/BSC: Min A (steadying Pt. > 75%) FIM - Tub/Shower Transfers Tub/shower Transfers: 0-Activity did not occur or was simulated   Refer to Care Plan for Long Term Goals  Recommendations for other services: None  Discharge Criteria: Patient will be discharged from OT if patient refuses treatment 3 consecutive times without medical reason, if treatment goals not met, if there is a change in medical status, if patient makes no progress towards goals or if patient is discharged from hospital.  The above assessment, treatment plan, treatment alternatives and goals were discussed and mutually agreed  upon: by patient  Shakiya Mcneary 06/24/2012, 9:56 AM

## 2012-06-24 NOTE — Evaluation (Signed)
Physical Therapy Assessment and Plan  Patient Details  Name: [name] MRN: [id number] Date of Birth: 04/16/1945  PT Diagnosis: Abnormality of gait and Dizziness and giddiness Rehab Potential: Good ELOS: 5-7 days   Today's Date: 06/24/2012 Time: 0930-1030 Time Calculation (min): 60 min  Problem List:  Patient Active Problem List   Diagnosis Date Noted  . 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 Numbness 06/20/2012  . Diabetes mellitus 06/20/2012  . CVA (cerebral infarction) 06/20/2012  . Hypokalemia 06/20/2012  . URI 01/28/2009  . ARTHRALGIA 01/28/2009  . CHEST PAIN UNSPECIFIED 11/26/2008  . DEPRESSION 09/04/2006  . HYPERTENSION 09/04/2006  . GERD 09/04/2006  . MULTIPLE SITES, ARTHRITIS-ACUTE/CHRONIC 09/04/2006  . LOW BACK PAIN, CHRONIC 09/04/2006    Past Medical History:  Past Medical History  Diagnosis Date  . Diabetes mellitus without complication   . Hypertension    Past Surgical History: History reviewed. No pertinent past surgical history.  Assessment & Plan Clinical Impression: [name] is a 67 y.o. right-handed female with history of hypertension as well as diabetes mellitus with peripheral neuropathy. Admitted 06/20/2012 with left-sided weakness. 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 MRA of the head negative. Echocardiogram with ejection fraction of 65% without emboli. 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 Patient did not receive TPA. Neurology services consulted placed on Plavix therapy for CVA prophylaxis. Physical and occupational therapy evaluations completed ongoing with recommendations of physical medicine rehabilitation consult to consider inpatient rehabilitation services. Patient was felt to be a good candidate for inpatient rehabilitation services and was admitted for comprehensive rehabilitation program. Patient transferred to CIR on 06/23/2012 .   Patient currently requires min with  mobility secondary to muscle weakness, decreased cardiorespiratoy endurance, impaired timing and sequencing and decreased coordination, na, na, vestibular issues due to unknown origin and decreased standing balance and decreased balance strategies.  Prior to hospitalization, patient was independent  with mobility and lived with Alone in a House home.  Home access is 1 (threshold)Stairs to enter.  Patient will benefit from skilled PT intervention to maximize safe functional mobility, minimize fall risk and decrease caregiver burden for planned discharge home with intermittent assist.  Anticipate patient will benefit from follow up OP at discharge.  PT - End of Session Activity Tolerance: Tolerates 30+ min activity with multiple rests Endurance Deficit: No PT Assessment Rehab Potential: Good Barriers to Discharge: Decreased caregiver support Barriers to Discharge Comments: Patient reports family "may be able to assist" PT Plan PT Intensity: Minimum of 1-2 x/day ,45 to 90 minutes PT Frequency: 5 out of 7 days PT Duration Estimated Length of Stay: 5-7 days PT Treatment/Interventions: Ambulation/gait training;Balance/vestibular training;Cognitive remediation/compensation;Community reintegration;Discharge planning;Neuromuscular re-education;Functional mobility training;DME/adaptive equipment instruction;Disease management/prevention;Pain management;Patient/family education;Psychosocial support;Splinting/orthotics;UE/LE Coordination activities;UE/LE Strength taining/ROM;Therapeutic Exercise;Therapeutic Activities;Stair training;Visual/perceptual remediation/compensation;Wheelchair propulsion/positioning PT Recommendation Recommendations for Other Services: Vestibular eval Follow Up Recommendations: Home health PT;Outpatient PT Patient destination: Home Equipment Recommended: Cane;Rolling walker with 5" wheels Equipment Details: DME assessment ongoing; Recommendations TBD upon d/c  Skilled Therapeutic  Intervention Therapeutic Intervention initiated after completion of evaluation, see details below for BERG Balance Test.  PT Evaluation Precautions/Restrictions Precautions Precautions: Fall Restrictions Weight Bearing Restrictions: No General Chart Reviewed: Yes Family/Caregiver Present: No  Pain Pain Assessment Pain Assessment: No/denies pain Pain Score: 0-No pain Home Living/Prior Functioning Home Living Lives With: Alone Available Help at Discharge: Family;Available PRN/intermittently Type of Home: House Home Access: Stairs to enter Entrance Stairs-Number of Steps: 1 (threshold) Entrance Stairs-Rails: None (  pt reports holding onto door frame) Home Layout: One level Bathroom Shower/Tub: Tub/shower unit;Curtain Firefighter: Standard Bathroom Accessibility: Yes How Accessible: Accessible via walker Home Adaptive Equipment: None Prior Function Level of Independence: Independent with basic ADLs;Independent with homemaking with ambulation;Independent with gait;Independent with transfers Able to Take Stairs?: Yes Driving: No Vocation: On disability Vision/Perception  Vision - History Baseline Vision: Wears glasses only for reading Patient Visual Report: No change from baseline Vision - Assessment Ocular Range of Motion: Within Functional Limits Tracking/Visual Pursuits: Able to track stimulus in all quads without difficulty Saccades: Within functional limits Visual Fields: No apparent deficits Additional Comments: Pt c/o dizziness with standing and ambulation. Perception Perception: Within Functional Limits Praxis Praxis: Intact  Cognition Overall Cognitive Status: Within Functional Limits for tasks assessed Arousal/Alertness: Awake/alert Orientation Level: Oriented X4 Awareness: Appears intact Safety/Judgment: Appears intact Sensation Sensation Light Touch:  (pt c/o left side numbness/ tingling) Stereognosis: Appears Intact Hot/Cold: Appears  Intact Proprioception: Appears Intact Additional Comments: Proprioception intact B ankle and great toes. Coordination Gross Motor Movements are Fluid and Coordinated: Yes Fine Motor Movements are Fluid and Coordinated: No (decreased speed and accuracy w/ rapid alternating movements) Heel Shin Test: Slight dysmetria/ undershooting with L LE Motor  Motor Motor: Abnormal tone Motor - Skilled Clinical Observations: 2 beat clonus L LE  Mobility Bed Mobility Bed Mobility: Supine to Sit;Sit to Supine Supine to Sit: 6: Modified independent (Device/Increase time);HOB flat Sit to Supine: 6: Modified independent (Device/Increase time);HOB flat Transfers Sit to Stand: 5: Supervision;With armrests;From chair/3-in-1;From bed Sit to Stand Details: Verbal cues for precautions/safety Stand to Sit: 5: Supervision;With armrests;To chair/3-in-1;To bed Stand to Sit Details (indicate cue type and reason): Verbal cues for precautions/safety Stand Pivot Transfers: 4: Min guard;With armrests Locomotion  Ambulation Ambulation: Yes Ambulation/Gait Assistance: 4: Min guard Ambulation Distance (Feet): 90 Feet Assistive device: 1 person hand held assist Ambulation/Gait Assistance Details: Patient instructed in gait training x90' with L HHA in controlled environment. Ambulation distance limited by patient c/o 10/10 dizziness. Gait Gait: Yes Gait Pattern: Impaired Gait Pattern: Step-through pattern;Narrow base of support Stairs / Additional Locomotion Stairs: Yes Stairs Assistance: 4: Min guard Stair Management Technique: One rail Right;Alternating pattern;Step to pattern;Forwards (alternating ascending, step-to descending) Number of Stairs: 5 Height of Stairs: 6 Wheelchair Mobility Wheelchair Mobility: Yes Wheelchair Assistance: 4: Administrator, sports Details: Verbal cues for sequencing;Verbal cues for technique;Verbal cues for Engineer, drilling: Both upper  extremities Wheelchair Parts Management: Needs assistance Distance: 150  Trunk/Postural Assessment  Cervical Assessment Cervical Assessment: Within Functional Limits Thoracic Assessment Thoracic Assessment: Within Functional Limits Lumbar Assessment Lumbar Assessment: Within Functional Limits Postural Control Postural Control: Within Functional Limits  Balance Balance Balance Assessed: Yes Standardized Balance Assessment Standardized Balance Assessment: Berg Balance Test Berg Balance Test Sit to Stand: Able to stand without using hands and stabilize independently Standing Unsupported: Able to stand safely 2 minutes Sitting with Back Unsupported but Feet Supported on Floor or Stool: Able to sit safely and securely 2 minutes Stand to Sit: Sits safely with minimal use of hands Transfers: Able to transfer safely, definite need of hands Standing Unsupported with Eyes Closed: Able to stand 10 seconds safely Standing Ubsupported with Feet Together: Able to place feet together independently and stand 1 minute safely From Standing, Reach Forward with Outstretched Arm: Can reach forward >12 cm safely (5") From Standing Position, Pick up Object from Floor: Able to pick up shoe safely and easily From Standing Position, Turn to Look Behind Over each Shoulder:  Looks behind from both sides and weight shifts well Turn 360 Degrees: Able to turn 360 degrees safely in 4 seconds or less Standing Unsupported, Alternately Place Feet on Step/Stool: Able to stand independently and safely and complete 8 steps in 20 seconds Standing Unsupported, One Foot in Front: Able to plae foot ahead of the other independently and hold 30 seconds Standing on One Leg: Able to lift leg independently and hold > 10 seconds Total Score: 53/56, indicating patient is at a low (~25%) risk for falls Static Sitting Balance Static Sitting - Level of Assistance: 6: Modified independent (Device/Increase time) Static Sitting -  Comment/# of Minutes: >20 Static Standing Balance Static Standing - Balance Support: No upper extremity supported Static Standing - Level of Assistance: 5: Stand by assistance Dynamic Standing Balance Dynamic Standing - Level of Assistance: 4: Min assist Extremity Assessment    RLE Assessment RLE Assessment: Within Functional Limits (Grossly 4+/5 to 5/5) LLE Assessment LLE Assessment: Within Functional Limits (Grossly 4+/5)  FIM:  FIM - Bed/Chair Transfer Bed/Chair Transfer Assistive Devices: Arm rests Bed/Chair Transfer: 6: Supine > Sit: No assist;6: Sit > Supine: No assist;4: Bed > Chair or W/C: Min A (steadying Pt. > 75%);4: Chair or W/C > Bed: Min A (steadying Pt. > 75%) FIM - Locomotion: Wheelchair Distance: 150 Locomotion: Wheelchair: 4: Travels 150 ft or more: maneuvers on rugs and over door sillls with minimal assistance (Pt.>75%) FIM - Locomotion: Ambulation Locomotion: Ambulation Assistive Devices: Other (comment) (L HHA) Ambulation/Gait Assistance: 4: Min guard Locomotion: Ambulation: 2: Travels 50 - 149 ft with minimal assistance (Pt.>75%) FIM - Locomotion: Stairs Locomotion: Building control surveyor: Hand rail - 1 Locomotion: Stairs: 2: Up and Down 4 - 11 stairs with minimal assistance (Pt.>75%)   Refer to Care Plan for Long Term Goals  Recommendations for other services: Other: vestibular eval  Discharge Criteria: Patient will be discharged from PT if patient refuses treatment 3 consecutive times without medical reason, if treatment goals not met, if there is a change in medical status, if patient makes no progress towards goals or if patient is discharged from hospital.  The above assessment, treatment plan, treatment alternatives and goals were discussed and mutually agreed upon: by patient  Chipper Herb. Semaj Kham, PT, DPT 06/24/2012, 11:52 AM

## 2012-06-24 NOTE — Progress Notes (Signed)
Nutrition Brief Note  Patient identified on the Malnutrition Screening Tool (MST) Report.  RD discussed nutrition hx with family on acute admission. Per daughters pt has had no recent weight changes and good appetite.   Body mass index is 25.7 kg/(m^2). Patient meets criteria for Overweight based on current BMI.   Current diet order is Carbohydrate Modified Medium, patient is consuming approximately >75% of meals at this time. Labs and medications reviewed.   No nutrition interventions warranted at this time. If nutrition issues arise, please consult RD.   Kendell Bane RD, LDN, CNSC [phone] Pager [phone] After Hours Pager

## 2012-06-24 NOTE — Progress Notes (Signed)
Inpatient Diabetes Program Recommendations  AACE/ADA: New Consensus Statement on Inpatient Glycemic Control (2013)  Target Ranges:  Prepandial:   less than 140 mg/dL      Peak postprandial:   less than 180 mg/dL (1-2 hours)      Critically ill patients:  140 - 180 mg/dL   Reason for Visit: Results for [name], [name] (MRN [id number]) as of 06/24/2012 09:58  Ref. Range 06/23/2012 07:01 06/23/2012 11:48 06/23/2012 16:16 06/23/2012 20:47  Glucose-Capillary Latest Range: 70-99 mg/dL 409 (H) 811 (H) 914 (H) 219 (H)   Note post-prandial CBG's elevated.  Consider adding Novolog meal coverage 3 units tid with meals (to be held if patient eats less than 50%).

## 2012-06-24 NOTE — Care Management Note (Signed)
    Page 1 of 1   06/24/2012     8:15:34 AM   CARE MANAGEMENT NOTE 06/24/2012  Patient:  [name], [name]   Account Number:  [id number]  Date Initiated:  06/24/2012  Documentation initiated by:  Jacquelynn Cree  Subjective/Objective Assessment:   admitted with CVA     Action/Plan:   PT/OT evals- recommended inpatient rehab   Anticipated DC Date:     Anticipated DC Plan:        DC Planning Services  CM consult      Choice offered to / List presented to:             Status of service:  Completed, signed off Medicare Important Message given?   (If response is "NO", the following Medicare IM given date fields will be blank) Date Medicare IM given:   Date Additional Medicare IM given:    Discharge Disposition:  IP REHAB FACILITY  Per UR Regulation:  Reviewed for med. necessity/level of care/duration of stay  If discussed at Long Length of Stay Meetings, dates discussed:    Comments:

## 2012-06-24 NOTE — Progress Notes (Addendum)
Patient ID: [name], female   DOB: 02/13/1945, 67 y.o.   MRN: [id number] Subjective/Complaints: 67 y.o. right-handed female with history of hypertension as well as diabetes mellitus with peripheral neuropathy. Admitted 06/20/2012 with left-sided weakness. 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 MRA of the head negative. Echocardiogram with ejection fraction of 65% without emboli. 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 Patient did not receive TPA. Neurology services consulted placed on Plavix therapy for CVA prophylaxis   Objective: Vital Signs: Blood pressure 133/70, pulse 62, temperature 97.5 F (36.4 C), temperature source Oral, resp. rate 18, weight 67.949 kg (149 lb 12.8 oz), SpO2 97.00%. Dg Abd Portable 1v  06/23/2012   *RADIOLOGY REPORT*  Clinical Data: Abdominal discomfort, rule out constipation  PORTABLE ABDOMEN - 1 VIEW  Comparison: None.  Findings: There are gas-filled loops of large and small bowel which are not distended beyond normal limits.  There is a moderate volume of gas and stool in the rectum and sigmoid colon.  No pathologic calcifications. Degenerative changes of the lumbar spine  IMPRESSION: Moderate volume of stool in the rectosigmoid colon could represent mild constipation.  No evidence of obstruction.  Minimal stool in the remainder of the gas filled colon.   Original Report Authenticated By: Genevive Bi, M.D.   Results for orders placed during the hospital encounter of 06/23/12 (from the past 72 hour(s))  GLUCOSE, CAPILLARY     Status: Abnormal   Collection Time    06/23/12  8:47 PM      Result Value Range   Glucose-Capillary 219 (*) 70 - 99 mg/dL  CBC WITH DIFFERENTIAL     Status: Abnormal   Collection Time    06/24/12  5:25 AM      Result Value Range   WBC 10.7 (*) 4.0 - 10.5 K/uL   RBC 3.81 (*) 3.87 - 5.11 MIL/uL   Hemoglobin 11.4 (*) 12.0 - 15.0 g/dL   HCT 40.9 (*) 81.1 - 91.4 %   MCV 89.8   78.0 - 100.0 fL   MCH 29.9  26.0 - 34.0 pg   MCHC 33.3  30.0 - 36.0 g/dL   RDW 78.2  95.6 - 21.3 %   Platelets 221  150 - 400 K/uL   Neutrophils Relative % 80 (*) 43 - 77 %   Neutro Abs 8.6 (*) 1.7 - 7.7 K/uL   Lymphocytes Relative 12  12 - 46 %   Lymphs Abs 1.3  0.7 - 4.0 K/uL   Monocytes Relative 6  3 - 12 %   Monocytes Absolute 0.7  0.1 - 1.0 K/uL   Eosinophils Relative 1  0 - 5 %   Eosinophils Absolute 0.1  0.0 - 0.7 K/uL   Basophils Relative 0  0 - 1 %   Basophils Absolute 0.0  0.0 - 0.1 K/uL  COMPREHENSIVE METABOLIC PANEL     Status: Abnormal   Collection Time    06/24/12  5:25 AM      Result Value Range   Sodium 136  135 - 145 mEq/L   Potassium 3.5  3.5 - 5.1 mEq/L   Chloride 96  96 - 112 mEq/L   CO2 32  19 - 32 mEq/L   Glucose, Bld 147 (*) 70 - 99 mg/dL   BUN 18  6 - 23 mg/dL   Creatinine, Ser 0.86 (*) 0.50 - 1.10 mg/dL   Calcium 8.9  8.4 - 57.8 mg/dL   Total  Protein 7.3  6.0 - 8.3 g/dL   Albumin 3.2 (*) 3.5 - 5.2 g/dL   AST 16  0 - 37 U/L   ALT 21  0 - 35 U/L   Alkaline Phosphatase 110  39 - 117 U/L   Total Bilirubin 0.3  0.3 - 1.2 mg/dL   GFR calc non Af Amer 43 (*) >90 mL/min   GFR calc Af Amer 50 (*) >90 mL/min   Comment:            The eGFR has been calculated     using the CKD EPI equation.     This calculation has not been     validated in all clinical     situations.     eGFR's persistently     <90 mL/min signify     possible Chronic Kidney Disease.     HEENT: normal and neck supple Cardio: RRR and no murmur Resp: CTA B/L and unlabored GI: BS positive and non tender Extremity:  Pulses positive and No Edema Skin:   Intact Neuro: Alert/Oriented, Cranial Nerve Abnormalities mild left facial parasthesia, Abnormal Sensory mild Left UE and LE parasthesia, Normal Motor, Abnormal FMC Ataxic/ dec FMC and Tone  Within Normal Limits and Tone:  Within Normal Limits Musc/Skel:  Normal Gen:NAD and   Assessment/Plan: 1. Functional deficits secondary to  corpus callosum infarct which require 3+ hours per day of interdisciplinary therapy in a comprehensive inpatient rehab setting. Physiatrist is providing close team supervision and 24 hour management of active medical problems listed below. Physiatrist and rehab team continue to assess barriers to discharge/monitor patient progress toward functional and medical goals. FIM:                   Comprehension Comprehension Mode: Auditory Comprehension: 5-Understands complex 90% of the time/Cues < 10% of the time  Expression Expression Mode: Verbal Expression: 5-Expresses basic needs/ideas: With extra time/assistive device  Social Interaction Social Interaction: 5-Interacts appropriately 90% of the time - Needs monitoring or encouragement for participation or interaction.  Problem Solving Problem Solving: 5-Solves basic problems: With no assist  Memory Memory: 5-Requires cues to use assistive device   Medical problem list: 1. 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 Diabetes mellitus with peripheral neuropathy, monitor CBGs, adjust medications as needed 3. Constipation acute on chronic. Start stool softeners, avoid suppositories and enemas, increase fluids, increase fiber, Cardizem may be contributing 4. Pain management will monitor for painful dysesthesias and adjust medications as needed. 5.  insomnia, trial Restoril 6. Hyperlipidemia, continue Lipitor and Zetia LOS (Days) 1 A FACE TO FACE EVALUATION WAS PERFORMED  KIRSTEINS,ANDREW E 06/24/2012, 7:40 AM

## 2012-06-24 NOTE — Care Management Note (Signed)
[facility] Individual Statement of Services  Patient Name:  [name]  Date:  06/24/2012  Welcome to the [facility].  Our goal is to provide you with an individualized program based on your diagnosis and situation, designed to meet your specific needs.  With this comprehensive rehabilitation program, you will be expected to participate in at least 3 hours of rehabilitation therapies Monday-Friday, with modified therapy programming on the weekends.  Your rehabilitation program will include the following services:  Physical Therapy (PT), Occupational Therapy (OT), 24 hour per day rehabilitation nursing, Therapeutic Recreaction (TR), Case Management ( Social Worker), Rehabilitation Medicine, Nutrition Services and Pharmacy Services  Weekly team conferences will be held on Wednesday to discuss your progress.  Your Social Worker will talk with you frequently to get your input and to update you on team discussions.  Team conferences with you and your family in attendance may also be held.  Expected length of stay: 5-7 days  Overall anticipated outcome: mod/i level  Depending on your progress and recovery, your program may change. Your Social Worker will coordinate services and will keep you informed of any changes. Your Child psychotherapist names and contact numbers are listed  below.  The following services may also be recommended but are not provided by the [facility]:   Home Health Rehabiltiation Services  Outpatient Rehabilitatation Servives    Arrangements will be made to provide these services after discharge if needed.  Arrangements include referral to agencies that provide these services.  Your insurance has been verified to be:  [employer]-Medicare & medicaid Your primary doctor is:  DR Clois Comber  Pertinent information will be shared with your doctor and your insurance company.  Social Worker:  Dossie Der, [place]  [phone]  Information discussed with and copy given to patient by: Lucy Chris, 06/24/2012, 9:38 AM

## 2012-06-25 ENCOUNTER — Inpatient Hospital Stay (HOSPITAL_COMMUNITY): Payer: PRIVATE HEALTH INSURANCE

## 2012-06-25 ENCOUNTER — Inpatient Hospital Stay (HOSPITAL_COMMUNITY): Payer: PRIVATE HEALTH INSURANCE | Admitting: Occupational Therapy

## 2012-06-25 LAB — GLUCOSE, CAPILLARY
Glucose-Capillary: 142 mg/dL — ABNORMAL HIGH (ref 70–99)
Glucose-Capillary: 138 mg/dL — ABNORMAL HIGH (ref 70–99)
Glucose-Capillary: 162 mg/dL — ABNORMAL HIGH (ref 70–99)

## 2012-06-25 NOTE — Progress Notes (Signed)
Recreational Therapy Session Note  Patient Details  Name: [name] MRN: [id number] Date of Birth: 28-Feb-1945 Today's Date: 06/25/2012  Per pt request, Pt participated in animal assisted activity/therapy bed level with supervision.  Rector Devonshire 06/25/2012, 5:20 PM

## 2012-06-25 NOTE — Progress Notes (Signed)
Received auth # from [facility] Medicare: [id number].  [phone])

## 2012-06-25 NOTE — Progress Notes (Signed)
Physical Therapy Session Note  Patient Details  Name: [name] MRN: [id number] Date of Birth: Aug 04, 1945  Today's Date: 06/25/2012 Time: 1300-1400 and 1300-1400 Time Calculation (min): 60 min and 60 min  Short Term Goals: Week 1:  PT Short Term Goal 1 (Week 1): STGs=LTGs secondary to short LOS  Skilled Therapeutic Interventions/Progress Updates:  Session 1:  Treatment focused on increasing activity tolerance, neuromuscular re-education for balance retraining   Gait without AD x 100' with min guard assist, cues for widening BOS and upright posture and forward gaze.  Pt performed toe walking x 3' before c/o extreme dizziness, became tearful.  She recovered after sitting x 2 minutes.  Pt describes dizziness as having black spots in her vision, blurry vision when ambulating.  Pt performed sidestepping L and R for closed chain hip abduction, and trunk rotation and reaching overhead L and R  without LOB or increased dizziness. Increased dizziness at times with mini squats, calf raises. Dynamic balance in standing on compliant Airex mat, with minimal LOB backwards.  Instructed pt in self stretching bil hamstrings and heel cords in sitting, using stool.  Gaze stabilization exs in sitting did not elicit increased dizziness , but pt was unable to initiate head movement while keeping eyes stationary, requiring min assist to start.  Eyes moving L><R and up>< down with head stationary did not elicit increased dizziness.  BP 152/72, HR 74 when c/o extreme dizziness.  Gait returning to room pushing w/c for AD, x 160' with close supervision.  Pt c/o throbbing HA upon standing, throughout distance to room.  Pt reported that she almost forgot and walked by herself in her room recently.  Safety belt applied and OT and nurse manager informed.    Discussed pt's peripheral neuropathy bil LEs; she was unaware that this was from DM.  Session 2:  Generally, pt with less dizziness and without HA this  session.  Pt did become tearful and have to sit down when exiting car; dizziness passed sufficienty after less than 1 min and pt able to continue.  Treatment focused on simulated car transfer, self stretching hamstrings and hee cords in standing; high level gait- retrieving items from floor, stepping through and over obstacles, dynamic standing balance during targeted heel tapping.  Supervision for all except one LOB when tapping with L heel, requiring mod assist to recover.  Introduced RW for confidence during gait; pt liked using it, but needed mod cues for safety, hand placement during sit>< stand, etc.  Therapist explained to pt that she will usually work in therapy without the AD, but may d/c with one.    DGI without AD = 15/24.  neuromuscular re-education via demo, VCs, manual cues for L trunk and LE activation during trunk shortening/lengthening with UE reaching out of BOS, dynamic standing balance during linen folding activity on rolling table, biased to L  Pt again asked why she has numbness/tingling in bil feet; she eventually stated that it "might" be from her "sugar".     Therapy Documentation Precautions:  Precautions Precautions: Fall Precaution Comments: dizzy with any movement Restrictions Weight Bearing Restrictions: No      Other Treatments: Treatments Neuromuscular Facilitation: Left;Lower Extremity;Activity to increase sustained activation;Activity to increase timing and sequencing;Activity to increase grading;Activity to increase lateral weight shifting;Activity to increase anterior-posterior weight shifting;Forced use  See FIM for current functional status  Therapy/Group: Individual Therapy  Danyell Shader 06/25/2012, 3:13 PM

## 2012-06-25 NOTE — Progress Notes (Signed)
Patient ID: [name], female   DOB: 12/20/1945, 67 y.o.   MRN: [id number] Subject and ive/Complaints: 67 y.o. right-handed female with history of hypertension as well as diabetes mellitus with peripheral neuropathy. Admitted 06/20/2012 with left-sided weakness. 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 MRA of the head negative. Echocardiogram with ejection fraction of 65% without emboli. 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 Patient did not receive TPA. Neurology services consulted placed on Plavix therapy for CVA prophylaxis   better last night, Restoril 7.5 mg.  Objective: Vital Signs: Blood pressure 156/69, pulse 70, temperature 98.1 F (36.7 C), temperature source Oral, resp. rate 18, weight 67.949 kg (149 lb 12.8 oz), SpO2 97.00%. Dg Abd Portable 1v  06/23/2012   *RADIOLOGY REPORT*  Clinical Data: Abdominal discomfort, rule out constipation  PORTABLE ABDOMEN - 1 VIEW  Comparison: None.  Findings: There are gas-filled loops of large and small bowel which are not distended beyond normal limits.  There is a moderate volume of gas and stool in the rectum and sigmoid colon.  No pathologic calcifications. Degenerative changes of the lumbar spine  IMPRESSION: Moderate volume of stool in the rectosigmoid colon could represent mild constipation.  No evidence of obstruction.  Minimal stool in the remainder of the gas filled colon.   Original Report Authenticated By: Genevive Bi, M.D.   Results for orders placed during the hospital encounter of 06/23/12 (from the past 72 hour(s))  GLUCOSE, CAPILLARY     Status: Abnormal   Collection Time    06/23/12  8:47 PM      Result Value Range   Glucose-Capillary 219 (*) 70 - 99 mg/dL  CBC WITH DIFFERENTIAL     Status: Abnormal   Collection Time    06/24/12  5:25 AM      Result Value Range   WBC 10.7 (*) 4.0 - 10.5 K/uL   RBC 3.81 (*) 3.87 - 5.11 MIL/uL   Hemoglobin 11.4 (*) 12.0 - 15.0 g/dL    HCT 40.9 (*) 81.1 - 46.0 %   MCV 89.8  78.0 - 100.0 fL   MCH 29.9  26.0 - 34.0 pg   MCHC 33.3  30.0 - 36.0 g/dL   RDW 91.4  78.2 - 95.6 %   Platelets 221  150 - 400 K/uL   Neutrophils Relative % 80 (*) 43 - 77 %   Neutro Abs 8.6 (*) 1.7 - 7.7 K/uL   Lymphocytes Relative 12  12 - 46 %   Lymphs Abs 1.3  0.7 - 4.0 K/uL   Monocytes Relative 6  3 - 12 %   Monocytes Absolute 0.7  0.1 - 1.0 K/uL   Eosinophils Relative 1  0 - 5 %   Eosinophils Absolute 0.1  0.0 - 0.7 K/uL   Basophils Relative 0  0 - 1 %   Basophils Absolute 0.0  0.0 - 0.1 K/uL  COMPREHENSIVE METABOLIC PANEL     Status: Abnormal   Collection Time    06/24/12  5:25 AM      Result Value Range   Sodium 136  135 - 145 mEq/L   Potassium 3.5  3.5 - 5.1 mEq/L   Chloride 96  96 - 112 mEq/L   CO2 32  19 - 32 mEq/L   Glucose, Bld 147 (*) 70 - 99 mg/dL   BUN 18  6 - 23 mg/dL   Creatinine, Ser 2.13 (*) 0.50 - 1.10 mg/dL   Calcium  8.9  8.4 - 10.5 mg/dL   Total Protein 7.3  6.0 - 8.3 g/dL   Albumin 3.2 (*) 3.5 - 5.2 g/dL   AST 16  0 - 37 U/L   ALT 21  0 - 35 U/L   Alkaline Phosphatase 110  39 - 117 U/L   Total Bilirubin 0.3  0.3 - 1.2 mg/dL   GFR calc non Af Amer 43 (*) >90 mL/min   GFR calc Af Amer 50 (*) >90 mL/min   Comment:            The eGFR has been calculated     using the CKD EPI equation.     This calculation has not been     validated in all clinical     situations.     eGFR's persistently     <90 mL/min signify     possible Chronic Kidney Disease.  GLUCOSE, CAPILLARY     Status: Abnormal   Collection Time    06/24/12  7:35 AM      Result Value Range   Glucose-Capillary 137 (*) 70 - 99 mg/dL   Comment 1 Notify RN    GLUCOSE, CAPILLARY     Status: Abnormal   Collection Time    06/24/12 11:25 AM      Result Value Range   Glucose-Capillary 125 (*) 70 - 99 mg/dL  GLUCOSE, CAPILLARY     Status: Abnormal   Collection Time    06/24/12  4:30 PM      Result Value Range   Glucose-Capillary 167 (*) 70 - 99 mg/dL    Comment 1 Notify RN    GLUCOSE, CAPILLARY     Status: Abnormal   Collection Time    06/24/12  8:42 PM      Result Value Range   Glucose-Capillary 135 (*) 70 - 99 mg/dL  GLUCOSE, CAPILLARY     Status: Abnormal   Collection Time    06/25/12  7:39 AM      Result Value Range   Glucose-Capillary 142 (*) 70 - 99 mg/dL   Comment 1 Notify RN       HEENT: normal and neck supple Cardio: RRR and no murmur Resp: CTA B/L and unlabored GI: BS positive and non tender Extremity:  Pulses positive and No Edema Skin:   Intact Neuro: Alert/Oriented, Cranial Nerve Abnormalities mild left facial parasthesia, Abnormal Sensory mild Left UE and LE parasthesia, Normal Motor, Abnormal FMC Ataxic/ dec FMC and Tone  Within Normal Limits and Tone:  Within Normal Limits Musc/Skel:  Normal Gen:NAD and   Assessment/Plan: 1. 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 Physiatrist is providing close team supervision and 24 hour management of active medical problems listed below. Physiatrist and rehab team continue to assess barriers to discharge/monitor patient progress toward functional and medical goals.  Team conference today refer to conference note FIM: FIM - Bathing Bathing Steps Patient Completed: Chest;Right Arm;Left Arm;Abdomen;Front perineal area;Buttocks;Right upper leg;Left upper leg;Right lower leg (including foot);Left lower leg (including foot) Bathing: 5: Supervision: Safety issues/verbal cues  FIM - Upper Body Dressing/Undressing Upper body dressing/undressing steps patient completed: Thread/unthread right bra strap;Thread/unthread left bra strap;Hook/unhook bra;Thread/unthread right sleeve of pullover shirt/dresss;Thread/unthread left sleeve of pullover shirt/dress;Put head through opening of pull over shirt/dress;Pull shirt over trunk Upper body dressing/undressing: 5:  Supervision: Safety issues/verbal cues FIM - Lower Body Dressing/Undressing Lower body dressing/undressing steps patient completed: Thread/unthread  right underwear leg;Thread/unthread left underwear leg;Pull underwear up/down;Thread/unthread right pants leg;Thread/unthread left pants leg;Pull pants up/down;Don/Doff right sock;Don/Doff left sock;Don/Doff right shoe;Fasten/unfasten pants;Don/Doff left shoe;Fasten/unfasten right shoe;Fasten/unfasten left shoe Lower body dressing/undressing: 5: Supervision: Safety issues/verbal cues  FIM - Toileting Toileting steps completed by patient: Adjust clothing prior to toileting;Performs perineal hygiene;Adjust clothing after toileting Toileting: 5: Supervision: Safety issues/verbal cues  FIM - Toilet Transfers Toilet Transfers: 4-To toilet/BSC: Min A (steadying Pt. > 75%);4-From toilet/BSC: Min A (steadying Pt. > 75%)  FIM - Bed/Chair Transfer Bed/Chair Transfer Assistive Devices: Arm rests Bed/Chair Transfer: 5: Chair or W/C > Bed: Supervision (verbal cues/safety issues);5: Bed > Chair or W/C: Supervision (verbal cues/safety issues)  FIM - Locomotion: Wheelchair Distance: 150 Locomotion: Wheelchair: 0: Activity did not occur FIM - Locomotion: Ambulation Locomotion: Ambulation Assistive Devices: Other (comment) (L HHA) Ambulation/Gait Assistance: 4: Min guard;5: Supervision;4: Min assist Locomotion: Ambulation: 4: Travels 150 ft or more with minimal assistance (Pt.>75%)  Comprehension Comprehension Mode: Auditory Comprehension: 6-Follows complex conversation/direction: With extra time/assistive device  Expression Expression Mode: Verbal Expression: 6-Expresses complex ideas: With extra time/assistive device  Social Interaction Social Interaction: 5-Interacts appropriately 90% of the time - Needs monitoring or encouragement for participation or interaction.  Problem Solving Problem Solving: 5-Solves basic problems: With no  assist  Memory Memory: 5-Requires cues to use assistive device   Medical problem list: 1. 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 Diabetes mellitus with peripheral neuropathy, monitor CBGs, adjust medications as needed 3. Constipation acute on chronic. Start stool softeners, avoid suppositories and enemas, increase fluids, increase fiber, Cardizem may be contributing 4. Pain management will monitor for painful dysesthesias and adjust medications as needed. 5.  insomnia, trial Restoril 6. Hyperlipidemia, continue Lipitor and Zetia LOS (Days) 2 A FACE TO FACE EVALUATION WAS PERFORMED  Uchenna Seufert E 06/25/2012, 8:41 AM

## 2012-06-25 NOTE — Patient Care Conference (Signed)
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 Numbness 06/20/2012  . Diabetes mellitus 06/20/2012  . CVA (cerebral infarction) 06/20/2012  . Hypokalemia 06/20/2012  . URI 01/28/2009  . ARTHRALGIA 01/28/2009  . CHEST PAIN UNSPECIFIED 11/26/2008  . DEPRESSION 09/04/2006  . HYPERTENSION 09/04/2006  . GERD 09/04/2006  . MULTIPLE SITES, ARTHRITIS-ACUTE/CHRONIC 09/04/2006  . LOW BACK PAIN, CHRONIC 09/04/2006    Expected Discharge Date: Expected Discharge Date: 06/28/12  Team Members Present: Physician leading conference: Dr. Claudette Laws Social Worker Present: Dossie Der, LCSW Nurse Present: Rosalio Macadamia, RN PT Present: Edman Circle, PT;Caroline Adriana Simas, PT;Other (comment) Clarisse Gouge Ripa-PT) OT Present: Other (comment);Leonette Monarch, Felipa Eth, OT (Kayla Perkinson-OT) SLP Present: Fae Pippin, SLP Other (Discipline and Name): Charolette Child Coordinator     Current Status/Progress Goal Weekly Team Focus  Medical   Insomnia improving, no acute medical issues  Maintain medical stability  Assess functional needs and plan discharge accordingly   Bowel/Bladder   cont of bowel and bladder; uses bathroom; on miralax BID  remain cont of bowel and bladder  monitor for constipation/diarrhea   Swallow/Nutrition/ Hydration     na        ADL's   supervision with self care, steady  assist with ADL transfers  mod I to I with self care and basic housekeeping/ meal prep  ADL retraining, balance training, vestibular exercises (based on vestibular eval), DME training, pt education   Mobility   S transfers, min A gait x90' and 5 stairs with one handrail  mod I all mobility  transfers, gait, stairs, activity tolerance, balance, strengthening, safety   Communication     na        Safety/Cognition/ Behavioral Observations    na        Pain   occasional complaints of headache relieved by PRN Vicodin/Tylenol  pain level less than 3  assess pain qshift and prn; medicate as needed   Skin   CDI  no infection or new breakdown  assess skin qshift and prn      *See Care Plan and progress notes for long and short-term goals.  Barriers to Discharge: See above    Possible Resolutions to Barriers:  See above    Discharge Planning/Teaching Needs:  Home with daughter's checking frequently and staying for first few days with.  Pt very high level      Team Discussion:  Dizziness issues-MD/PT to address.  Pain in head still when first gets up.  Making good progress-will be alone at home.  Revisions to Treatment Plan:  None   Continued Need for Acute Rehabilitation Level of Care: The patient requires daily medical management by a physician with specialized training in physical medicine and rehabilitation for the following conditions: Daily direction of a multidisciplinary physical rehabilitation program to ensure safe treatment while eliciting the highest outcome that is of practical value to  the patient.: Yes Daily medical management of patient stability for increased activity during participation in an intensive rehabilitation regime.: Yes Daily analysis of laboratory values and/or radiology reports with any subsequent need for medication adjustment of medical intervention for : Neurological problems  Elianah Karis, Lemar Livings 06/25/2012, 3:36 PM

## 2012-06-25 NOTE — Progress Notes (Signed)
Occupational Therapy Session Note  Patient Details  Name: THY GULLIKSON MRN: [id number] Date of Birth: 05-16-1945  Today's Date: 06/25/2012 Time: 0800-0900 Time Calculation (min): 60 min  Short Term Goals: Week 1:  OT Short Term Goal 1 (Week 1): STGs = LTGs  Skilled Therapeutic Interventions/Progress Updates:    Pt seen for BADL retraining of toileting and B/D at shower level with a focus on safe functional mobility.  Pt's dynamic balance is strong, but it is affected when she feels dizzy.  Pt becomes dizzy easily when standing and walking.  She is able to complete all self care at a distant supervision level, but needs steady assist with transfers into bathroom due to dizziness.  Pt worked on gaze stabilization exercises by focusing on a spoon while turning head. Initially this was extremely difficult for patient and she needed to have her head manually turned. She eventually was able to grasp the concept, but it was difficult for her to maintain her gaze and turn her head more than 30 degrees to each side.  Pt could only tolerate 4 repetitions.  Pt's PT had arrived for her next session.  Therapy Documentation Precautions:  Precautions Precautions: Fall Precaution Comments: dizzy with any movement Restrictions Weight Bearing Restrictions: No  Pain: Pain Assessment Pain Assessment: No/denies pain ADL:  See FIM for current functional status  Therapy/Group: Individual Therapy  SAGUIER,JULIA 06/25/2012, 10:28 AM

## 2012-06-25 NOTE — Progress Notes (Signed)
Social Work Patient ID: [name], female   DOB: 05/22/1945, 67 y.o.   MRN: [id number] Met with pt to inform of team conference goals-mod/i level and discharge 6/7.  She will talk with her daughter's regarding discharge to get transportation home. Discussed follow up and DME and pt is concerned about cost.  Will discuss with team, what definitely is needed. Pt is pleased with her progress but hopes her dizziness gets Better.  Continue to work on discharge needs.

## 2012-06-25 NOTE — Progress Notes (Signed)
Occupational Therapy Note  Patient Details  Name: [name] MRN: [id number] Date of Birth: 1945-10-06 Today's Date: 06/25/2012  Time: 1000-1029 Pt denies pain Individual therapy  Pt seated in recliner with quick release belt on.  Pt performed stand pivot transfer to w/c with steady A.  Pt engaged in tub transfers with tub transfer bench X 3 requiring steady A.  Pt amb without AD to ADL room and engaged in simple home mgmt tasks with steady A.  Pt returned to recliner with call bell within reach and quick release belt in place.   Lavone Neri [facility] 06/25/2012, 11:23 AM

## 2012-06-26 ENCOUNTER — Inpatient Hospital Stay (HOSPITAL_COMMUNITY): Payer: Medicare Other

## 2012-06-26 ENCOUNTER — Inpatient Hospital Stay (HOSPITAL_COMMUNITY): Payer: PRIVATE HEALTH INSURANCE | Admitting: Occupational Therapy

## 2012-06-26 LAB — GLUCOSE, CAPILLARY
Glucose-Capillary: 175 mg/dL — ABNORMAL HIGH (ref 70–99)
Glucose-Capillary: 131 mg/dL — ABNORMAL HIGH (ref 70–99)
Glucose-Capillary: 144 mg/dL — ABNORMAL HIGH (ref 70–99)
Glucose-Capillary: 174 mg/dL — ABNORMAL HIGH (ref 70–99)

## 2012-06-26 MED ORDER — TEMAZEPAM 15 MG PO CAPS
15.0000 mg | ORAL_CAPSULE | Freq: Every day | ORAL | Status: DC
  Administered 2012-06-26 – 2012-06-27 (×2): 15 mg via ORAL
  Filled 2012-06-26 (×2): qty 1
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
## 2012-06-26 NOTE — Progress Notes (Signed)
Occupational Therapy Session Note  Patient Details  Name: [name] MRN: [id number] Date of Birth: 02/16/1945  Today's Date: 06/26/2012 Time: 0800-0900 Time Calculation (min): 60 min  Short Term Goals: No short term goals set  Skilled Therapeutic Interventions/Progress Updates:      Pt seen for BADL retraining of toileting, bathing, and dressing with a focus on dynamic standing balance and safety awareness. Pt will be going home on Saturday so she will need to focus on completing ADLs as independently as possible. Pt would like to have a tub bench.  Pt is more secure and feels more confident ambulating with a RW. Today she used the RW to complete all self care. She only required distant supervision for all transfers (she rehearsed recliner to toilet and back 3x) and she was mod I with bathing, dressing, and toileting.  Pt did not c/o dizziness or a headache. Pt resting in recliner at end of session with call light and phone in reach. Reviewed with pt safey plan of her calling for assistance and not getting up by herself.   Therapy Documentation Precautions:  Precautions Precautions: Fall Precaution Comments: dizzy with any movement Restrictions Weight Bearing Restrictions: No    Pain: Pain Assessment Pain Assessment: No/denies pain Pain Score: 0-No pain ADL:  See FIM for current functional status  Therapy/Group: Individual Therapy  Goran Olden 06/26/2012, 10:30 AM

## 2012-06-26 NOTE — Progress Notes (Signed)
Occupational Therapy Session Note  Patient Details  Name: [name] MRN: [id number] Date of Birth: [birth year]  Today's Date: 06/26/2012 Time: 1500-1530 Time Calculation (min): 30 min  Short Term Goals: Week 1:  OT Short Term Goal 1 (Week 1): STGs = LTGs  Skilled Therapeutic Interventions/Progress Updates:  Patient resting in recliner upon arrival and ambulated with RW to therapy kitchen for cooking activity.  Patient ambulated with RW to locate and retrieve items needed to cook grilled cheese sandwich.  While cooking sandwich, patient able to state that if she got tired at home while cooking she would take a rest break and sit in the living room for just a short period. Also reviewed that when patient is feeling strong at home that she could place the walker at a close distance and use the kitchen counter tops for stability since her kitchen is small and she can be safe using this method.  Patient ambulated to her room without the RW so she could carry the grilled cheese back to her room.  All items within reach.  Patient was Mod I for this cooking task.  Therapy Documentation Precautions:  Precautions Precautions: Fall Precaution Comments: dizzy with reaching toward feet in standing, x 3 Restrictions Weight Bearing Restrictions: No Pain: Reports abdominal discomfort yet not rated.  Rest and repositioned ADL: See FIM for current functional status  Therapy/Group: Individual Therapy  Deyani Hegarty 06/26/2012, 3:38 PM

## 2012-06-26 NOTE — Progress Notes (Signed)
Patient ID: [name], female   DOB: 09/19/1945, 67 y.o.   MRN: [id number] Subject and ive/Complaints: 67 y.o. right-handed female with history of hypertension as well as diabetes mellitus with peripheral neuropathy. Admitted 06/20/2012 with left-sided weakness. 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 MRA of the head negative. Echocardiogram with ejection fraction of 65% without emboli. 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 Patient did not receive TPA. Neurology services consulted placed on Plavix therapy for CVA prophylaxis  Poor sleep last night, Restoril 7.5 mg.  Objective: Vital Signs: Blood pressure 158/62, pulse 65, temperature 97.7 F (36.5 C), temperature source Oral, resp. rate 18, weight 67.949 kg (149 lb 12.8 oz), SpO2 96.00%. No results found. Results for orders placed during the hospital encounter of 06/23/12 (from the past 72 hour(s))  GLUCOSE, CAPILLARY     Status: Abnormal   Collection Time    06/23/12  8:47 PM      Result Value Range   Glucose-Capillary 219 (*) 70 - 99 mg/dL  CBC WITH DIFFERENTIAL     Status: Abnormal   Collection Time    06/24/12  5:25 AM      Result Value Range   WBC 10.7 (*) 4.0 - 10.5 K/uL   RBC 3.81 (*) 3.87 - 5.11 MIL/uL   Hemoglobin 11.4 (*) 12.0 - 15.0 g/dL   HCT 29.5 (*) 62.1 - 30.8 %   MCV 89.8  78.0 - 100.0 fL   MCH 29.9  26.0 - 34.0 pg   MCHC 33.3  30.0 - 36.0 g/dL   RDW 65.7  84.6 - 96.2 %   Platelets 221  150 - 400 K/uL   Neutrophils Relative % 80 (*) 43 - 77 %   Neutro Abs 8.6 (*) 1.7 - 7.7 K/uL   Lymphocytes Relative 12  12 - 46 %   Lymphs Abs 1.3  0.7 - 4.0 K/uL   Monocytes Relative 6  3 - 12 %   Monocytes Absolute 0.7  0.1 - 1.0 K/uL   Eosinophils Relative 1  0 - 5 %   Eosinophils Absolute 0.1  0.0 - 0.7 K/uL   Basophils Relative 0  0 - 1 %   Basophils Absolute 0.0  0.0 - 0.1 K/uL  COMPREHENSIVE METABOLIC PANEL     Status: Abnormal   Collection Time    06/24/12   5:25 AM      Result Value Range   Sodium 136  135 - 145 mEq/L   Potassium 3.5  3.5 - 5.1 mEq/L   Chloride 96  96 - 112 mEq/L   CO2 32  19 - 32 mEq/L   Glucose, Bld 147 (*) 70 - 99 mg/dL   BUN 18  6 - 23 mg/dL   Creatinine, Ser 9.52 (*) 0.50 - 1.10 mg/dL   Calcium 8.9  8.4 - 84.1 mg/dL   Total Protein 7.3  6.0 - 8.3 g/dL   Albumin 3.2 (*) 3.5 - 5.2 g/dL   AST 16  0 - 37 U/L   ALT 21  0 - 35 U/L   Alkaline Phosphatase 110  39 - 117 U/L   Total Bilirubin 0.3  0.3 - 1.2 mg/dL   GFR calc non Af Amer 43 (*) >90 mL/min   GFR calc Af Amer 50 (*) >90 mL/min   Comment:            The eGFR has been calculated     using the  CKD EPI equation.     This calculation has not been     validated in all clinical     situations.     eGFR's persistently     <90 mL/min signify     possible Chronic Kidney Disease.  GLUCOSE, CAPILLARY     Status: Abnormal   Collection Time    06/24/12  7:35 AM      Result Value Range   Glucose-Capillary 137 (*) 70 - 99 mg/dL   Comment 1 Notify RN    GLUCOSE, CAPILLARY     Status: Abnormal   Collection Time    06/24/12 11:25 AM      Result Value Range   Glucose-Capillary 125 (*) 70 - 99 mg/dL  GLUCOSE, CAPILLARY     Status: Abnormal   Collection Time    06/24/12  4:30 PM      Result Value Range   Glucose-Capillary 167 (*) 70 - 99 mg/dL   Comment 1 Notify RN    GLUCOSE, CAPILLARY     Status: Abnormal   Collection Time    06/24/12  8:42 PM      Result Value Range   Glucose-Capillary 135 (*) 70 - 99 mg/dL  GLUCOSE, CAPILLARY     Status: Abnormal   Collection Time    06/25/12  7:39 AM      Result Value Range   Glucose-Capillary 142 (*) 70 - 99 mg/dL   Comment 1 Notify RN    GLUCOSE, CAPILLARY     Status: Abnormal   Collection Time    06/25/12 11:29 AM      Result Value Range   Glucose-Capillary 138 (*) 70 - 99 mg/dL   Comment 1 Notify RN    GLUCOSE, CAPILLARY     Status: Abnormal   Collection Time    06/25/12  4:33 PM      Result Value Range    Glucose-Capillary 162 (*) 70 - 99 mg/dL   Comment 1 Notify RN    GLUCOSE, CAPILLARY     Status: Abnormal   Collection Time    06/25/12  9:08 PM      Result Value Range   Glucose-Capillary 175 (*) 70 - 99 mg/dL  GLUCOSE, CAPILLARY     Status: Abnormal   Collection Time    06/26/12  7:45 AM      Result Value Range   Glucose-Capillary 131 (*) 70 - 99 mg/dL     HEENT: normal and neck supple Cardio: RRR and no murmur Resp: CTA B/L and unlabored GI: BS positive and non tender Extremity:  Pulses positive and No Edema Skin:   Intact Neuro: Alert/Oriented, Cranial Nerve Abnormalities mild left facial parasthesia, Abnormal Sensory mild Left UE and LE parasthesia, Normal Motor, Abnormal FMC Ataxic/ dec FMC and Tone  Within Normal Limits and Tone:  Within Normal Limits Musc/Skel:  Normal Gen:NAD and   Assessment/Plan: 1. 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 Physiatrist is providing close team supervision and 24 hour management of active medical problems listed below. Physiatrist and rehab team continue to assess barriers to discharge/monitor patient progress toward functional and medical goals.  Team conference today refer to conference note FIM: FIM - Bathing Bathing Steps Patient Completed: Chest;Right Arm;Left Arm;Abdomen;Front perineal area;Buttocks;Right upper leg;Left upper leg;Right lower leg (including foot);Left lower leg (including foot) Bathing: 5: Supervision: Safety issues/verbal cues  FIM - Upper Body Dressing/Undressing Upper  body dressing/undressing steps patient completed: Thread/unthread right bra strap;Thread/unthread left bra strap;Hook/unhook bra;Thread/unthread right sleeve of pullover shirt/dresss;Thread/unthread left sleeve of pullover shirt/dress;Put head through opening of pull over shirt/dress;Pull shirt over trunk Upper body  dressing/undressing: 5: Supervision: Safety issues/verbal cues FIM - Lower Body Dressing/Undressing Lower body dressing/undressing steps patient completed: Thread/unthread right underwear leg;Thread/unthread left underwear leg;Pull underwear up/down;Thread/unthread right pants leg;Thread/unthread left pants leg;Pull pants up/down;Don/Doff right sock;Don/Doff left sock;Don/Doff right shoe;Fasten/unfasten pants;Don/Doff left shoe;Fasten/unfasten right shoe;Fasten/unfasten left shoe Lower body dressing/undressing: 5: Supervision: Safety issues/verbal cues  FIM - Toileting Toileting steps completed by patient: Adjust clothing after toileting;Performs perineal hygiene;Adjust clothing prior to toileting Toileting: 5: Supervision: Safety issues/verbal cues  FIM - Toilet Transfers Toilet Transfers: 4-To toilet/BSC: Min A (steadying Pt. > 75%);4-From toilet/BSC: Min A (steadying Pt. > 75%)  FIM - Bed/Chair Transfer Bed/Chair Transfer Assistive Devices: Arm rests Bed/Chair Transfer: 5: Chair or W/C > Bed: Supervision (verbal cues/safety issues)  FIM - Locomotion: Wheelchair Distance: 150 Locomotion: Wheelchair: 0: Activity did not occur FIM - Locomotion: Ambulation Locomotion: Ambulation Assistive Devices: Other (comment) (no AD) Ambulation/Gait Assistance: 4: Min guard Locomotion: Ambulation: 2: Travels 50 - 149 ft with minimal assistance (Pt.>75%)  Comprehension Comprehension Mode: Auditory Comprehension: 6-Follows complex conversation/direction: With extra time/assistive device  Expression Expression Mode: Verbal Expression: 6-Expresses complex ideas: With extra time/assistive device  Social Interaction Social Interaction: 6-Interacts appropriately with others with medication or extra time (anti-anxiety, antidepressant).  Problem Solving Problem Solving: 5-Solves basic problems: With no assist  Memory Memory: 5-Requires cues to use assistive device   Medical problem list: 1. Corpus  callosum infarct continue Plavix for secondary stroke prophylaxis 2. Diabetes mellitus with peripheral neuropathy, monitor CBGs, adjust medications as needed 3. Constipation acute on chronic. Start stool softeners, avoid suppositories and enemas, increase fluids, increase fiber, Cardizem may be contributing 4. Pain management will monitor for painful dysesthesias and adjust medications as needed. 5.  insomnia, trial Restoril increase dose 6. Hyperlipidemia, continue Lipitor and Zetia LOS (Days) 3 A FACE TO FACE EVALUATION WAS PERFORMED  KIRSTEINS,ANDREW E 06/26/2012, 8:40 AM

## 2012-06-27 ENCOUNTER — Inpatient Hospital Stay (HOSPITAL_COMMUNITY): Payer: PRIVATE HEALTH INSURANCE

## 2012-06-27 ENCOUNTER — Inpatient Hospital Stay (HOSPITAL_COMMUNITY): Payer: PRIVATE HEALTH INSURANCE | Admitting: Occupational Therapy

## 2012-06-27 ENCOUNTER — Inpatient Hospital Stay (HOSPITAL_COMMUNITY): Payer: PRIVATE HEALTH INSURANCE | Admitting: *Deleted

## 2012-06-27 LAB — GLUCOSE, CAPILLARY
Glucose-Capillary: 153 mg/dL — ABNORMAL HIGH (ref 70–99)
Glucose-Capillary: 131 mg/dL — ABNORMAL HIGH (ref 70–99)
Glucose-Capillary: 176 mg/dL — ABNORMAL HIGH (ref 70–99)
Glucose-Capillary: 133 mg/dL — ABNORMAL HIGH (ref 70–99)
Glucose-Capillary: 171 mg/dL — ABNORMAL HIGH (ref 70–99)

## 2012-06-27 MED ORDER — POLYETHYLENE GLYCOL 3350 17 G PO PACK: 17.0000 g | PACK | Freq: Two times a day (BID) | ORAL | Status: DC

## 2012-06-27 MED ORDER — HYDROCODONE-ACETAMINOPHEN 5-325 MG PO TABS: 1.0000 | ORAL_TABLET | Freq: Four times a day (QID) | ORAL | Status: DC | PRN

## 2012-06-27 MED ORDER — PANTOPRAZOLE SODIUM 40 MG PO TBEC: 40.0000 mg | DELAYED_RELEASE_TABLET | Freq: Every day | ORAL | Status: DC

## 2012-06-27 MED ORDER — DILTIAZEM HCL ER COATED BEADS 360 MG PO CP24: 360.0000 mg | ORAL_CAPSULE | Freq: Every day | ORAL | Status: DC

## 2012-06-27 MED ORDER — CLOPIDOGREL BISULFATE 75 MG PO TABS: 75.0000 mg | ORAL_TABLET | Freq: Every day | ORAL | Status: DC

## 2012-06-27 MED ORDER — EZETIMIBE 10 MG PO TABS: 10.0000 mg | ORAL_TABLET | Freq: Every day | ORAL | Status: DC

## 2012-06-27 MED ORDER — ATORVASTATIN CALCIUM 10 MG PO TABS: 10.0000 mg | ORAL_TABLET | Freq: Every day | ORAL | Status: DC

## 2012-06-27 MED ORDER — ATENOLOL-CHLORTHALIDONE 50-25 MG PO TABS: 1.0000 | ORAL_TABLET | Freq: Every day | ORAL | Status: DC

## 2012-06-27 MED ORDER — LOSARTAN POTASSIUM 100 MG PO TABS: 100.0000 mg | ORAL_TABLET | Freq: Every day | ORAL | Status: DC

## 2012-06-27 NOTE — Progress Notes (Signed)
Occupational Therapy Discharge Summary  Patient Details  Name: KAYLINE SHEER MRN: [id number] Date of Birth: 05/23/1945  Today's Date: 06/27/2012  Patient has met 11 of 11 long term goals due to improved activity tolerance, improved balance, postural control, functional use of  LEFT upper extremity and improved coordination.  Patient to discharge at overall Modified Independent level.  Patient's care partner is independent to provide the necessary physical assistance at discharge.  Pt's daughter will assist with complex meal prep, grocery shopping and complex house keeping.    Recommendation:    No further OT services required.  Equipment: transfer tub bench  Reasons for discharge: treatment goals met  Patient/family agrees with progress made and goals achieved: Yes  OT Discharge   ADL  mod I to independent with all self care and simple meal prep, laundry, and housekeeping Vision/Perception  Vision - History Patient Visual Report: No change from baseline Vision - Assessment Eye Alignment: Within Functional Limits Ocular Range of Motion: Within Functional Limits Tracking/Visual Pursuits: Able to track stimulus in all quads without difficulty Saccades: Within functional limits Visual Fields: No apparent deficits Additional Comments: Pt now demonstrates functional gaze stabilization, Perception Perception: Within Functional Limits Praxis Praxis: Intact  Cognition Overall Cognitive Status: Within Functional Limits for tasks assessed Orientation Level: Oriented X4 Sensation Sensation Light Touch: Appears Intact Stereognosis: Appears Intact Hot/Cold: Appears Intact Proprioception: Appears Intact Coordination Gross Motor Movements are Fluid and Coordinated: Yes Fine Motor Movements are Fluid and Coordinated: Yes Motor  Motor Motor: Within Functional Limits Mobility    mod I - I (pt uses RW for longer distances) Trunk/Postural Assessment  Cervical Assessment Cervical  Assessment: Within Functional Limits Thoracic Assessment Thoracic Assessment: Within Functional Limits Lumbar Assessment Lumbar Assessment: Within Functional Limits Postural Control Postural Control: Within Functional Limits  Balance Static Sitting Balance Static Sitting - Level of Assistance: 7: Independent Static Standing Balance Static Standing - Level of Assistance: 7: Independent Dynamic Standing Balance Dynamic Standing - Level of Assistance: 7: Independent Extremity/Trunk Assessment RUE Assessment RUE Assessment: Within Functional Limits LUE Assessment LUE Assessment: Within Functional Limits  See FIM for current functional status  SAGUIER,JULIA 06/27/2012, 10:24 AM

## 2012-06-27 NOTE — Progress Notes (Signed)
Occupational Therapy Session Note  Patient Details  Name: [name] MRN: [id number] Date of Birth: Dec 27, 1945  Today's Date: 06/27/2012 Time: 0800-0900 Time Calculation (min): 60 min  Short Term Goals: Week 1:  OT Short Term Goal 1 (Week 1): STGs = LTGs     Skilled Therapeutic Interventions/Progress Updates:      Pt seen for BADL retraining of toileting, bathing, and dressing in the ADL apartment and IADL retraining of simple housekeeping and laundry tasks  with a focus on safe mobility and dynamic balance with and without the walker.  Pt prefers to use the RW for longer distances, but is able to ambulate around the ADL apartment and bathroom independently. Pt used tub bench in tub with mod I and was independent in all of her self care. She went back to her room and organized some of her supplies and clothing independently.  Pt will be discharged to home tomorrow and is now independent in her room.  Pt will benefit from one more OT session today to give her simple home exercises to do at home to continue to improve her balance and activity tolerance.  Therapy Documentation Precautions:  Precautions Precautions: Fall Precaution Comments: dizzy with reaching toward feet in standing, x 3 Restrictions Weight Bearing Restrictions: No  Pain: No c/o pain   ADL:  See FIM for current functional status  Therapy/Group: Individual Therapy  SAGUIER,JULIA 06/27/2012, 10:16 AM

## 2012-06-27 NOTE — Progress Notes (Addendum)
Patient ID: Adriana KREISER, female   DOB: 1945-11-14, 66 y.o.   MRN: [id number] Subject and ive/Complaints: 67 y.o. right-handed female with history of hypertension as well as diabetes mellitus with peripheral neuropathy. Admitted 06/20/2012 with left-sided weakness. 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 MRA of the head negative. Echocardiogram with ejection fraction of 65% without emboli. 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 Patient did not receive TPA. Neurology services consulted placed on Plavix therapy for CVA prophylaxis  good sleep last night, Restoril 15 mg.  Objective: Vital Signs: Blood pressure 147/68, pulse 62, temperature 98.2 F (36.8 C), temperature source Oral, resp. rate 17, weight 67.949 kg (149 lb 12.8 oz), SpO2 98.00%. No results found. Results for orders placed during the hospital encounter of 06/23/12 (from the past 72 hour(s))  GLUCOSE, CAPILLARY     Status: Abnormal   Collection Time    06/24/12 11:25 AM      Result Value Range   Glucose-Capillary 125 (*) 70 - 99 mg/dL  GLUCOSE, CAPILLARY     Status: Abnormal   Collection Time    06/24/12  4:30 PM      Result Value Range   Glucose-Capillary 167 (*) 70 - 99 mg/dL   Comment 1 Notify RN    GLUCOSE, CAPILLARY     Status: Abnormal   Collection Time    06/24/12  8:42 PM      Result Value Range   Glucose-Capillary 135 (*) 70 - 99 mg/dL  GLUCOSE, CAPILLARY     Status: Abnormal   Collection Time    06/25/12  7:39 AM      Result Value Range   Glucose-Capillary 142 (*) 70 - 99 mg/dL   Comment 1 Notify RN    GLUCOSE, CAPILLARY     Status: Abnormal   Collection Time    06/25/12 11:29 AM      Result Value Range   Glucose-Capillary 138 (*) 70 - 99 mg/dL   Comment 1 Notify RN    GLUCOSE, CAPILLARY     Status: Abnormal   Collection Time    06/25/12  4:33 PM      Result Value Range   Glucose-Capillary 162 (*) 70 - 99 mg/dL   Comment 1 Notify RN    GLUCOSE,  CAPILLARY     Status: Abnormal   Collection Time    06/25/12  9:08 PM      Result Value Range   Glucose-Capillary 175 (*) 70 - 99 mg/dL  GLUCOSE, CAPILLARY     Status: Abnormal   Collection Time    06/26/12  7:45 AM      Result Value Range   Glucose-Capillary 131 (*) 70 - 99 mg/dL  GLUCOSE, CAPILLARY     Status: Abnormal   Collection Time    06/26/12 11:31 AM      Result Value Range   Glucose-Capillary 144 (*) 70 - 99 mg/dL  GLUCOSE, CAPILLARY     Status: Abnormal   Collection Time    06/26/12  4:50 PM      Result Value Range   Glucose-Capillary 174 (*) 70 - 99 mg/dL   Comment 1 Notify RN    GLUCOSE, CAPILLARY     Status: Abnormal   Collection Time    06/26/12  8:54 PM      Result Value Range   Glucose-Capillary 153 (*) 70 - 99 mg/dL   Comment 1 Notify RN    GLUCOSE,  CAPILLARY     Status: Abnormal   Collection Time    06/27/12  7:21 AM      Result Value Range   Glucose-Capillary 131 (*) 70 - 99 mg/dL   Comment 1 Notify RN       HEENT: normal and neck supple Cardio: RRR and no murmur Resp: CTA B/L and unlabored GI: BS positive and non tender Extremity:  Pulses positive and No Edema Skin:   Intact Neuro: Alert/Oriented, Cranial Nerve Abnormalities mild left facial parasthesia, Abnormal Sensory mild Left UE and LE parasthesia, Normal Motor, Abnormal FMC Ataxic/ dec FMC and Tone  Within Normal Limits and Tone:  Within Normal Limits Musc/Skel:  Normal Gen:NAD and   Assessment/Plan: 1. 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 Physiatrist is providing close team supervision and 24 hour management of active medical problems listed below. Physiatrist and rehab team continue to assess barriers to discharge/monitor patient progress toward functional and medical goals.  FIM: FIM - Bathing Bathing Steps Patient Completed: Chest;Right Arm;Left  Arm;Abdomen;Front perineal area;Buttocks;Right upper leg;Left upper leg;Right lower leg (including foot);Left lower leg (including foot) Bathing: 6: More than reasonable amount of time  FIM - Upper Body Dressing/Undressing Upper body dressing/undressing steps patient completed: Thread/unthread right bra strap;Thread/unthread left bra strap;Hook/unhook bra;Thread/unthread right sleeve of pullover shirt/dresss;Thread/unthread left sleeve of pullover shirt/dress;Put head through opening of pull over shirt/dress;Pull shirt over trunk Upper body dressing/undressing: 6: More than reasonable amount of time FIM - Lower Body Dressing/Undressing Lower body dressing/undressing steps patient completed: Thread/unthread right underwear leg;Thread/unthread left underwear leg;Pull underwear up/down;Thread/unthread right pants leg;Thread/unthread left pants leg;Pull pants up/down;Don/Doff right sock;Don/Doff left sock;Don/Doff right shoe;Fasten/unfasten pants;Don/Doff left shoe;Fasten/unfasten right shoe;Fasten/unfasten left shoe Lower body dressing/undressing: 6: More than reasonable amount of time  FIM - Toileting Toileting steps completed by patient: Adjust clothing prior to toileting;Performs perineal hygiene;Adjust clothing after toileting Toileting Assistive Devices: Grab bar or rail for support Toileting: 6: Assistive device: No helper  FIM - Diplomatic Services operational officer Devices: Art gallery manager Transfers: 5-To toilet/BSC: Supervision (verbal cues/safety issues);5-From toilet/BSC: Supervision (verbal cues/safety issues)  FIM - Press photographer Assistive Devices: Arm rests Bed/Chair Transfer: 5: Chair or W/C > Bed: Supervision (verbal cues/safety issues);5: Bed > Chair or W/C: Supervision (verbal cues/safety issues)  FIM - Locomotion: Wheelchair Distance: 150 Locomotion: Wheelchair: 0: Activity did not occur FIM - Locomotion: Ambulation Locomotion: Ambulation  Assistive Devices: Designer, industrial/product Ambulation/Gait Assistance: 5: Supervision Locomotion: Ambulation: 5: Travels 150 ft or more with supervision/safety issues  Comprehension Comprehension Mode: Auditory Comprehension: 6-Follows complex conversation/direction: With extra time/assistive device  Expression Expression Mode: Verbal Expression: 6-Expresses complex ideas: With extra time/assistive device  Social Interaction Social Interaction: 6-Interacts appropriately with others with medication or extra time (anti-anxiety, antidepressant).  Problem Solving Problem Solving: 6-Solves complex problems: With extra time  Memory Memory: 6-More than reasonable amt of time   Medical problem list: 1. 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 Diabetes mellitus with peripheral neuropathy, monitor CBGs, adjust medications as needed 3. Constipation acute on chronic. Start stool softeners, avoid suppositories and enemas, increase fluids, increase fiber, Cardizem may be contributing 4. Pain management will monitor for painful dysesthesias and adjust medications as needed. 5.  insomnia, trial Restoril increase dose 6. Hyperlipidemia, continue Lipitor and Zetia LOS (Days) 4 A FACE TO FACE EVALUATION WAS PERFORMED  Analy Bassford E 06/27/2012, 8:34 AM

## 2012-06-27 NOTE — Progress Notes (Signed)
Occupational Therapy Session Note  Patient Details  Name: SUZZANNE BRUNKHORST MRN: [id number] Date of Birth: 15-May-1945  Today's Date: 06/27/2012 Time:   1345-14:25  (40 min) Time Calculation (min): 40 min  Short Term Goals: Week 1:  OT Short Term Goal 1 (Week 1): STGs = LTGs  Skilled Therapeutic Interventions/Progress Updates:    Adressed exercises to do at home for balance and activity tolerance.  Wnet over wall push ups and wall squats.  Provided written handout for pt to take home.  Pt. Ambulated in room with no AD and no LOB.  Ready for D/c 06/28/12.     Therapy Documentation Precautions:  Precautions Precautions: Fall Precaution Comments: dizzy with reaching toward feet in standing, x 3 Restrictions Weight Bearing Restrictions: No  Pain:  none    Exercises:  Written handout provided.     Other Treatments:    See FIM for current functional status  Therapy/Group: Individual Therapy  Humberto Seals 06/27/2012, 12:38 PM

## 2012-06-27 NOTE — Discharge Summary (Signed)
NAMEREYLYNN, VANALSTINE                ACCOUNT NO.:  [id number]  MEDICAL RECORD NO.:  [id number]  LOCATION:  4144                         FACILITY:  [facility]  PHYSICIAN:  Erick Colace, M.D.DATE OF BIRTH:  [birth year]  DATE OF ADMISSION:  06/23/2012 DATE OF DISCHARGE:  06/28/2012                              DISCHARGE SUMMARY   DISCHARGE DIAGNOSES: 1. 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 2. Sequential compression devices for DVT prophylaxis. 3. Hypertension. 4. Diabetes mellitus with peripheral neuropathy. 5. Hyperlipidemia.  This is a 67 year old right-handed female with history of hypertension, diabetes mellitus with peripheral neuropathy who was admitted Jun 20, 2012, with a left-sided weakness.  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  MRA of the head negative.  Echocardiogram with ejection fraction of 65% without emboli. Carotid Dopplers with right 40-59% ICA stenosis.  The patient did not receive tPA.  Neurology Service was consulted, placed on Plavix therapy for stroke prophylaxis.  Physical and occupational therapy ongoing.  The patient was admitted for comprehensive rehab program.  PAST MEDICAL HISTORY:  See discharge diagnoses.  SOCIAL HISTORY:  Lives alone 1-level home.  Functional history prior to admission independent.  Functional status upon admission to rehab services was minimal assist, ambulate 120 feet with one-person handheld assistance.  PHYSICAL EXAMINATION:  VITAL SIGNS:  Blood pressure 130/56, pulse 94, temperature 98.3, respirations 17. GENERAL:  This was an alert female, in no acute distress, oriented x3. Pupils round and reactive to light. LUNGS:  Clear to auscultation. CARDIAC:  Regular rate and rhythm. ABDOMEN:  Soft, nontender.  Good bowel sounds. NEUROLOGIC:  She followed simple commands.  She displayed good insight and awareness to her deficits.  REHABILITATION HOSPITAL COURSE:   The patient was admitted to inpatient rehab services with therapies initiated on a 3-hour daily basis consisting of physical therapy, occupational therapy, and 24-hour rehabilitation nursing.  The following issues were addressed during patient's rehabilitation stay.  Pertaining to Ms. Finder, thrombotic corpus callosum infarction remained stable, maintained on Plavix therapy.  She would follow up with Neurology Services.  Sequential compression devices were in place for DVT prophylaxis.  Blood pressure is well-controlled on Tenormin, Hygroton, Cardizem, and Cozaar with no orthostatic changes.  She would follow up with her primary MD.  The patient did have a history of diabetes mellitus with peripheral neuropathy.  Hemoglobin A1c of 7.2.  The patient was on Metaglip prior to admission.  This would be resumed as tolerated.  Blood sugars 144, 174, 153.  She remained on Lipitor as well as Zetia for hyperlipidemia. The patient received weekly collaborative interdisciplinary team conferences to discuss estimated length of stay, family teaching, and any barriers to discharge.  She was continent of bowel and bladder, supervision with self-care steady assist with activities of daily living, supervision minimal assist to ambulate 90 feet, as well as 5 stairs with 1 hand rail.  She did have some issues of dizziness.  All issues in regard to vestibular training was ongoing.  Full family teaching was completed and plans to be discharged to home.  DISCHARGE MEDICATIONS: 1. Tenormin 50 mg p.o. daily. 2. Lipitor 10 mg  p.o. daily. 3. Hygroton 25 mg p.o. daily. 4. Plavix 75 mg p.o. daily. 5. Cardizem CD 360 mg p.o. daily. 6. Zetia 10 mg p.o. daily. 7. Hydrocodone 1 tablet every 6 hours as needed pain dispense of 60     tablets. 8. Cozaar 100 mg p.o. daily. 9. Protonix 40 mg p.o. daily. 10.MiraLax 17 g p.o. daily with 8 ounces of water hold for loose     stools. 11.Senokot tablets 2 daily.  DIET:   Regular.  SPECIAL INSTRUCTIONS:  The patient would follow up with Dr. Claudette Laws to the outpatient rehab center July 14, 2012, Dr. Delia Heady, Neurology Services, call for appointment in 2 weeks.  Dr. Dayna Barker 2 weeks medical management.  The patient was advised no driving.  Ongoing therapies were arranged as per rehab services.     Mariam Dollar, P.A.   ______________________________ Erick Colace, M.D.    DA/MEDQ  D:  06/27/2012  T:  06/27/2012  Job:  [id number]  cc:   Erick Colace, M.D. Pramod P. Pearlean Brownie, MD Dayna Barker, MD

## 2012-06-27 NOTE — Progress Notes (Signed)
Social Work Discharge Note Discharge Note  The overall goal for the admission was met for:   Discharge location: Yes-HOME WITH DAUGHTER'S CHECKING IN ON HER  Length of Stay: Yes-5 DAYS  Discharge activity level: Yes-MOD/I LEVEL  Home/community participation: Yes  Services provided included: MD, RD, PT, OT, RN, TR, Pharmacy and SW  Financial Services: Medicaid and Private Insurance: [facility]  Follow-up services arranged: Home Health: [facility]-PT,RN, DME: [facility]-ROLLING WALKER, TUB BENCH and Patient/Family has no preference for HH/DME agencies  Comments (or additional information):PT MADE GOOD PROGRESS AND READY TO GO HOME.  SHE WILL FOLLOW UP WITH PCP, SHE IS IN THE PROCESS OF SWITCHING THEM.  Patient/Family verbalized understanding of follow-up arrangements: Yes  Individual responsible for coordination of the follow-up plan: SELF & DORIS-DAUGHTER  Confirmed correct DME delivered: Lucy Chris 06/27/2012    Lucy Chris

## 2012-06-27 NOTE — Progress Notes (Signed)
Physical Therapy Discharge Summary  Patient Details  Name: [name] MRN: [id number] Date of Birth: 08/24/1945  Today's Date: 06/27/2012 Time: 1435-1515 and 11:00 - 11:57 Time Calculation (min): 40 min and 57 minutes  Patient has met 9 of 9 long term goals due to improved activity tolerance, improved balance, improved postural control, increased strength and improved coordination.  Patient to discharge at an ambulatory level Modified Independent.    Reasons goals not met: n/a - patient met all goals  Recommendation:  Patient will benefit from ongoing skilled PT services in home health setting to continue to advance safe functional mobility and minimize fall risk.  Equipment: rolling walker  Reasons for discharge: treatment goals met and discharge from hospital  Patient/family agrees with progress made and goals achieved: Yes  Session 1: Patient sitting in recliner upon entering room. Patient demonstrated the following with modified independence: car transfer; bed mobility on double bed; furniture transfers to sofa and kitchen chair; floor transfer; ambulation in home like setting with and without rolling walker; and up and down 12 steps with 1 rail. Patient reviewed home exercise program and performed Otaga exercises for balance/fall prevention x 10 reps each. Patient exercised on Nustep x 10 minutes on workload 5 with exertion at 15. Patient left in recliner - modified independent in room.  Session 2: Treatment focused on community ambulation and high level balance. Patient ambulated with rolling walker 500+ feet outside on inclined sidewalks, across grassy surfaces, and through gift shop modified independently. Patient used Wii bowling for dynamic balance with cueing to use controller. Patient returned to room mod I with RW. Discussed activity level and safety at home for discharge. Patient's RW in her room and adjusted for her appropriately. Patient reported having no further  questions.   PT Discharge Precautions/Restrictions Precautions Precautions: Fall Restrictions Weight Bearing Restrictions: No Vital Signs Therapy Vitals Temp: 98.1 F (36.7 C) Temp src: Oral Pulse Rate: 56 Resp: 17 BP: 146/57 mmHg Patient Position, if appropriate: Sitting Oxygen Therapy SpO2: 99 % O2 Device: None (Room air) Pain Pain Assessment Pain Assessment: No/denies pain Cognition Overall Cognitive Status: Within Functional Limits for tasks assessed Arousal/Alertness: Awake/alert Orientation Level: Oriented X4 Sensation Sensation Light Touch: Appears Intact Stereognosis: Appears Intact Hot/Cold: Not tested Proprioception: Not tested Coordination Gross Motor Movements are Fluid and Coordinated: Yes Fine Motor Movements are Fluid and Coordinated: Yes Motor  Motor Motor: Within Functional Limits  Mobility Bed Mobility Supine to Sit: 7: Independent Sitting - Scoot to Edge of Bed: 7: Independent Sit to Supine: 7: Independent Transfers Sit to Stand: 6: Modified independent (Device/Increase time) Stand to Sit: 6: Modified independent (Device/Increase time) Stand Pivot Transfers: 6: Modified independent (Device/Increase time) Locomotion  Ambulation Ambulation: Yes Ambulation/Gait Assistance: 6: Modified independent (Device/Increase time) Ambulation Distance (Feet): 300 Feet Assistive device: Rolling walker Gait Gait Pattern: Step-through pattern Stairs / Additional Locomotion Stairs: Yes Stairs Assistance: 6: Modified independent (Device/Increase time) Stair Management Technique: One rail Right;Alternating pattern Number of Stairs: 12 Height of Stairs: 6     See FIM for current functional status  [name] 06/27/2012, 4:15 PM

## 2012-06-27 NOTE — Progress Notes (Signed)
Social Work Patient ID: [name], female   DOB: 1945/08/15, 67 y.o.   MRN: [id number] Discussed pt's PCP she is in the middle of switching them due to her current PCP contract for medicaid is not being renewed. She has her information on her card at home and will follow up with on her own.

## 2012-06-27 NOTE — Discharge Instructions (Signed)
Inpatient Rehab Discharge Instructions  [name] Discharge date and time: No discharge date for patient encounter.   Activities/Precautions/ Functional Status: Activity: activity as tolerated Diet: diabetic diet Wound Care: none needed Functional status:  ___ No restrictions     ___ Walk up steps independently ___ 24/7 supervision/assistance   ___ Walk up steps with assistance ___ Intermittent supervision/assistance  ___ Bathe/dress independently ___ Walk with walker     ___ Bathe/dress with assistance ___ Walk Independently    ___ Shower independently _x STROKE/TIA DISCHARGE INSTRUCTIONS SMOKING Cigarette smoking nearly doubles your risk of having a stroke & is the single most alterable risk factor  If you smoke or have smoked in the last 12 months, you are advised to quit smoking for your health.  Most of the excess cardiovascular risk related to smoking disappears within a year of stopping.  Ask you doctor about anti-smoking medications  [place] Quit Line: 1-800-QUIT NOW  Free Smoking Cessation Classes [phone]  CHOLESTEROL Know your levels; limit fat & cholesterol in your diet  Lipid Panel     Component Value Date/Time   CHOL 189 06/21/2012 0404   TRIG 107 06/21/2012 0404   HDL 68 06/21/2012 0404   CHOLHDL 2.8 06/21/2012 0404   VLDL 21 06/21/2012 0404   LDLCALC 100* 06/21/2012 0404      Many patients benefit from treatment even if their cholesterol is at goal.  Goal: Total Cholesterol (CHOL) less than 160  Goal:  Triglycerides (TRIG) less than 150  Goal:  HDL greater than 40  Goal:  LDL (LDLCALC) less than 100   BLOOD PRESSURE American Stroke Association blood pressure target is less that 120/80 mm/Hg  Your discharge blood pressure is:  BP: 156/69 mmHg  Monitor your blood pressure  Limit your salt and alcohol intake  Many individuals will require more than one medication for high blood pressure  DIABETES (A1c is a blood sugar average for last 3 months) Goal  HGBA1c is under 7% (HBGA1c is blood sugar average for last 3 months)  Diabetes: No known diagnosis of diabetes    Lab Results  Component Value Date   HGBA1C 7.2* 06/20/2012     Your HGBA1c can be lowered with medications, healthy diet, and exercise.  Check your blood sugar as directed by your physician  Call your physician if you experience unexplained or low blood sugars.  PHYSICAL ACTIVITY/REHABILITATION Goal is 30 minutes at least 4 days per week  Activity: {STROKE DC ACTIVITIES:22360} Therapies: {STROKE DC THERAPIES:22361} Return to work: ***  Activity decreases your risk of heart attack and stroke and makes your heart stronger.  It helps control your weight and blood pressure; helps you relax and can improve your mood.  Participate in a regular exercise program.  Talk with your doctor about the best form of exercise for you (dancing, walking, swimming, cycling).  DIET/WEIGHT Goal is to maintain a healthy weight  Your discharge diet is: Carb Control *** liquids Your height is:    Your current weight is: Weight: 67.949 kg (149 lb 12.8 oz) Your Body Mass Index (BMI) is:     Following the type of diet specifically designed for you will help prevent another stroke.  Your goal weight range is:  ***  Your goal Body Mass Index (BMI) is 19-24.  Healthy food habits can help reduce 3 risk factors for stroke:  High cholesterol, hypertension, and excess weight.  RESOURCES Stroke/Support Group:  Call [phone]   STROKE EDUCATION PROVIDED/REVIEWED AND GIVEN  TO PATIENT Stroke warning signs and symptoms How to activate emergency medical system (call 911). Medications prescribed at discharge. Need for follow-up after discharge. Personal risk factors for stroke. Pneumonia vaccine given: {STROKE DC YES/NO/DATE:22363} Flu vaccine given: {STROKE DC YES/NO/DATE:22363} My questions have been answered, the writing is legible, and I understand these instructions.  I will adhere to these  goals & educational materials that have been provided to me after my discharge from the hospital.    __ Walk with assistance    ___ Shower with assistance ___ No alcohol     ___ Return to work/school ________  Special Instructions:     COMMUNITY REFERRALS UPON DISCHARGE:    Home Health:   PT, RN   Agency:[facility] Phone:[phone] Date of last service:06/28/2012  Medical Equipment/Items Ordered:ROLLING WALKER & TUB BENCH  Agency/Supplier:[facility]   GENERAL COMMUNITY RESOURCES FOR PATIENT/FAMILY: Support Groups:[facility]PPORT [facility] & STROKE [facility]ARRIORS      My questions have been answered and I understand these instructions. I will adhere to these goals and the provided educational materials after my discharge from the hospital.  Patient/Caregiver Signature _______________________________ Date __________  Clinician Signature _______________________________________ Date __________  Please bring this form and your medication list with you to all your follow-up doctor's appointments.

## 2012-06-27 NOTE — Discharge Summary (Signed)
  Discharge summary job [id number]

## 2012-06-28 LAB — GLUCOSE, CAPILLARY: Glucose-Capillary: 150 mg/dL — ABNORMAL HIGH (ref 70–99)

## 2012-06-28 NOTE — Progress Notes (Signed)
Patient ID: [name], female   DOB: 1945-02-19, 67 y.o.   MRN: [id number] Subject and ive/Complaints: 67 y.o. right-handed female with history of hypertension as well as diabetes mellitus with peripheral neuropathy. Admitted 06/20/2012 with left-sided weakness. 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 MRA of the head negative. Echocardiogram with ejection fraction of 65% without emboli. 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 Patient did not receive TPA. Neurology services consulted placed on Plavix therapy for CVA prophylaxis  good sleep last night, Restoril 15 mg. Excited this am re D/C Objective: Vital Signs: Blood pressure 138/52, pulse 58, temperature 98.1 F (36.7 C), temperature source Oral, resp. rate 16, weight 67.949 kg (149 lb 12.8 oz), SpO2 98.00%. No results found. Results for orders placed during the hospital encounter of 06/23/12 (from the past 72 hour(s))  GLUCOSE, CAPILLARY     Status: Abnormal   Collection Time    06/25/12 11:29 AM      Result Value Range   Glucose-Capillary 138 (*) 70 - 99 mg/dL   Comment 1 Notify RN    GLUCOSE, CAPILLARY     Status: Abnormal   Collection Time    06/25/12  4:33 PM      Result Value Range   Glucose-Capillary 162 (*) 70 - 99 mg/dL   Comment 1 Notify RN    GLUCOSE, CAPILLARY     Status: Abnormal   Collection Time    06/25/12  9:08 PM      Result Value Range   Glucose-Capillary 175 (*) 70 - 99 mg/dL  GLUCOSE, CAPILLARY     Status: Abnormal   Collection Time    06/26/12  7:45 AM      Result Value Range   Glucose-Capillary 131 (*) 70 - 99 mg/dL  GLUCOSE, CAPILLARY     Status: Abnormal   Collection Time    06/26/12 11:31 AM      Result Value Range   Glucose-Capillary 144 (*) 70 - 99 mg/dL  GLUCOSE, CAPILLARY     Status: Abnormal   Collection Time    06/26/12  4:50 PM      Result Value Range   Glucose-Capillary 174 (*) 70 - 99 mg/dL   Comment 1 Notify RN    GLUCOSE,  CAPILLARY     Status: Abnormal   Collection Time    06/26/12  8:54 PM      Result Value Range   Glucose-Capillary 153 (*) 70 - 99 mg/dL   Comment 1 Notify RN    GLUCOSE, CAPILLARY     Status: Abnormal   Collection Time    06/27/12  7:21 AM      Result Value Range   Glucose-Capillary 131 (*) 70 - 99 mg/dL   Comment 1 Notify RN    GLUCOSE, CAPILLARY     Status: Abnormal   Collection Time    06/27/12 11:58 AM      Result Value Range   Glucose-Capillary 176 (*) 70 - 99 mg/dL   Comment 1 Notify RN    GLUCOSE, CAPILLARY     Status: Abnormal   Collection Time    06/27/12  4:52 PM      Result Value Range   Glucose-Capillary 133 (*) 70 - 99 mg/dL  GLUCOSE, CAPILLARY     Status: Abnormal   Collection Time    06/27/12  9:38 PM      Result Value Range   Glucose-Capillary 171 (*) 70 -  99 mg/dL  GLUCOSE, CAPILLARY     Status: Abnormal   Collection Time    06/28/12  7:18 AM      Result Value Range   Glucose-Capillary 150 (*) 70 - 99 mg/dL   Comment 1 Notify RN       HEENT: normal and neck supple Cardio: RRR and no murmur Resp: CTA B/L and unlabored GI: BS positive and non tender Extremity:  Pulses positive and No Edema Skin:   Intact Neuro: Alert/Oriented, Cranial Nerve Abnormalities mild left facial parasthesia, Abnormal Sensory mild Left UE and LE parasthesia, Normal Motor, Abnormal FMC Ataxic/ dec FMC and Tone  Within Normal Limits and Tone:  Within Normal Limits Musc/Skel:  Normal Gen:NAD and   Assessment/Plan: 1. 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  Stable for D/C today F/u PCP in 1-2 weeks F/u PM&R 3 weeks See D/C summary See D/C instructions  FIM: FIM - Bathing Bathing Steps Patient Completed: Chest;Right Arm;Left Arm;Abdomen;Front perineal area;Buttocks;Right upper leg;Left upper leg;Right lower leg (including foot);Left lower leg (including  foot) Bathing: 6: More than reasonable amount of time  FIM - Upper Body Dressing/Undressing Upper body dressing/undressing steps patient completed: Thread/unthread right bra strap;Thread/unthread left bra strap;Hook/unhook bra;Thread/unthread right sleeve of pullover shirt/dresss;Thread/unthread left sleeve of pullover shirt/dress;Put head through opening of pull over shirt/dress;Pull shirt over trunk Upper body dressing/undressing: 7: Complete Independence: No helper FIM - Lower Body Dressing/Undressing Lower body dressing/undressing steps patient completed: Thread/unthread right underwear leg;Thread/unthread left underwear leg;Pull underwear up/down;Thread/unthread right pants leg;Thread/unthread left pants leg;Pull pants up/down;Don/Doff right sock;Don/Doff left sock;Don/Doff right shoe;Fasten/unfasten pants;Don/Doff left shoe;Fasten/unfasten right shoe;Fasten/unfasten left shoe Lower body dressing/undressing: 7: Complete Independence: No helper  FIM - Toileting Toileting steps completed by patient: Adjust clothing prior to toileting;Performs perineal hygiene;Adjust clothing after toileting Toileting Assistive Devices: Grab bar or rail for support Toileting: 7: Independent: No helper, no device  FIM - Diplomatic Services operational officer Devices: Art gallery manager Transfers: 7-Independent: No helper  FIM - Banker Devices: Arm rests Bed/Chair Transfer: 6: Assistive device: no helper  FIM - Locomotion: Wheelchair Distance: 150 Locomotion: Wheelchair: 0: Activity did not occur FIM - Locomotion: Ambulation Locomotion: Ambulation Assistive Devices: Designer, industrial/product Ambulation/Gait Assistance: 6: Modified independent (Device/Increase time) Locomotion: Ambulation: 6: Travels 150 ft or more with assistive device/no helper  Comprehension Comprehension Mode: Auditory Comprehension: 6-Follows complex conversation/direction: With extra  time/assistive device  Expression Expression Mode: Verbal Expression: 6-Expresses complex ideas: With extra time/assistive device  Social Interaction Social Interaction: 6-Interacts appropriately with others with medication or extra time (anti-anxiety, antidepressant).  Problem Solving Problem Solving: 5-Solves basic problems: With no assist  Memory Memory: 6-More than reasonable amt of time   Medical problem list: 1. 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 Diabetes mellitus with peripheral neuropathy, monitor CBGs, adjust medications as needed 3. Constipation acute on chronic.Cardizem may be contributing 4. Pain management will monitor for painful dysesthesias and adjust medications as needed. 5.  insomnia, trial Restoril increase dose 6. Hyperlipidemia, continue Lipitor and Zetia LOS (Days) 5 A FACE TO FACE EVALUATION WAS PERFORMED  KIRSTEINS,ANDREW E 06/28/2012, 9:18 AM

## 2012-06-28 NOTE — Progress Notes (Signed)
[name] left unit with family members . And staff member at 1000

## 2012-07-14 ENCOUNTER — Inpatient Hospital Stay: Payer: Medicare Other | Admitting: Physical Medicine & Rehabilitation

## 2012-07-27 ENCOUNTER — Encounter (HOSPITAL_COMMUNITY): Payer: Self-pay | Admitting: Emergency Medicine

## 2012-07-27 DIAGNOSIS — Z8673 Personal history of transient ischemic attack (TIA), and cerebral infarction without residual deficits: Secondary | ICD-10-CM | POA: Insufficient documentation

## 2012-07-27 DIAGNOSIS — R109 Unspecified abdominal pain: Secondary | ICD-10-CM | POA: Insufficient documentation

## 2012-07-27 DIAGNOSIS — E119 Type 2 diabetes mellitus without complications: Secondary | ICD-10-CM | POA: Insufficient documentation

## 2012-07-27 DIAGNOSIS — Z79899 Other long term (current) drug therapy: Secondary | ICD-10-CM | POA: Insufficient documentation

## 2012-07-27 DIAGNOSIS — I10 Essential (primary) hypertension: Secondary | ICD-10-CM | POA: Insufficient documentation

## 2012-07-27 LAB — COMPREHENSIVE METABOLIC PANEL
ALT: 10 U/L (ref 0–35)
AST: 16 U/L (ref 0–37)
Albumin: 3.5 g/dL (ref 3.5–5.2)
Alkaline Phosphatase: 83 U/L (ref 39–117)
BUN: 26 mg/dL — ABNORMAL HIGH (ref 6–23)
CO2: 28 mEq/L (ref 19–32)
Calcium: 8.6 mg/dL (ref 8.4–10.5)
Chloride: 101 mEq/L (ref 96–112)
Creatinine, Ser: 1.58 mg/dL — ABNORMAL HIGH (ref 0.50–1.10)
GFR calc Af Amer: 39 mL/min — ABNORMAL LOW (ref >90)
GFR calc non Af Amer: 33 mL/min — ABNORMAL LOW (ref >90)
Glucose, Bld: 106 mg/dL — ABNORMAL HIGH (ref 70–99)
Potassium: 3.5 mEq/L (ref 3.5–5.1)
Sodium: 138 mEq/L (ref 135–145)
Total Bilirubin: 0.3 mg/dL (ref 0.3–1.2)
Total Protein: 7.3 g/dL (ref 6.0–8.3)

## 2012-07-27 LAB — CBC WITH DIFFERENTIAL/PLATELET
Basophils Absolute: 0 10*3/uL (ref 0.0–0.1)
Basophils Relative: 0 % (ref 0–1)
Eosinophils Absolute: 0.2 10*3/uL (ref 0.0–0.7)
Eosinophils Relative: 3 % (ref 0–5)
HCT: 31.4 % — ABNORMAL LOW (ref 36.0–46.0)
Hemoglobin: 10.6 g/dL — ABNORMAL LOW (ref 12.0–15.0)
Lymphocytes Relative: 15 % (ref 12–46)
Lymphs Abs: 1.3 10*3/uL (ref 0.7–4.0)
MCH: 31.2 pg (ref 26.0–34.0)
MCHC: 33.8 g/dL (ref 30.0–36.0)
MCV: 92.4 fL (ref 78.0–100.0)
Monocytes Absolute: 0.6 10*3/uL (ref 0.1–1.0)
Monocytes Relative: 7 % (ref 3–12)
Neutro Abs: 6.5 10*3/uL (ref 1.7–7.7)
Neutrophils Relative %: 76 % (ref 43–77)
Platelets: 181 10*3/uL (ref 150–400)
RBC: 3.4 MIL/uL — ABNORMAL LOW (ref 3.87–5.11)
RDW: 13.3 % (ref 11.5–15.5)
WBC: 8.6 10*3/uL (ref 4.0–10.5)

## 2012-07-27 NOTE — ED Notes (Addendum)
PT. REPORTS MID ABDOMINAL PAIN ONSET LAST Thursday , DENIES NAUSEA OR VOMITTING , PT. ALSO REPORTS DIZZINESS WHEN STANDING UP ONSETT ODAY , ALERT AND ORIENTED , SPEECH CLEAR / NO FACIAL ASYMMETRY. EQUAL STRONG GRIPS / NO ARM DRIFT.

## 2012-07-28 ENCOUNTER — Emergency Department (HOSPITAL_COMMUNITY)
Admission: EM | Admit: 2012-07-28 | Discharge: 2012-07-28 | Disposition: A | Discharge status: Home / Self Care | Payer: PRIVATE HEALTH INSURANCE | Attending: Emergency Medicine | Admitting: Emergency Medicine

## 2012-07-28 ENCOUNTER — Emergency Department (HOSPITAL_COMMUNITY): Payer: PRIVATE HEALTH INSURANCE

## 2012-07-28 DIAGNOSIS — R109 Unspecified abdominal pain: Secondary | ICD-10-CM

## 2012-07-28 HISTORY — DX: Cerebral infarction, unspecified: I63.9

## 2012-07-28 LAB — URINALYSIS, ROUTINE W REFLEX MICROSCOPIC
Bilirubin Urine: NEGATIVE
Glucose, UA: NEGATIVE mg/dL
Hgb urine dipstick: NEGATIVE
Ketones, ur: NEGATIVE mg/dL
Leukocytes, UA: NEGATIVE
Nitrite: NEGATIVE
Protein, ur: NEGATIVE mg/dL
Specific Gravity, Urine: 1.02 (ref 1.005–1.030)
Urobilinogen, UA: 1 mg/dL (ref 0.0–1.0)
pH: 6 (ref 5.0–8.0)

## 2012-07-28 MED ORDER — DICYCLOMINE HCL 20 MG PO TABS: 20.0000 mg | ORAL_TABLET | Freq: Two times a day (BID) | ORAL | Status: DC

## 2012-07-28 NOTE — ED Provider Notes (Signed)
History    CSN: [id number] Arrival date & time 07/27/12  2117  First MD Initiated Contact with Patient 07/28/12 0018     Chief Complaint  Patient presents with  . Abdominal Pain   (Consider location/radiation/quality/duration/timing/severity/associated sxs/prior Treatment) HPI Comments: Patient reports that she doesn't feel well. She says that she had a stroke last week and since then hasn't been feeling right. She has been experiencing poor appetite. She hasn't been drinking much. She denies abdominal pain. There has not been any vomiting or diarrhea. Patient reports that her head "isn't right" since the stroke. There is no chest pain or shortness of breath.  Patient is a 67 y.o. female presenting with abdominal pain.  Abdominal Pain Pertinent negatives include no chest pain, no abdominal pain and no shortness of breath.   Past Medical History  Diagnosis Date  . Diabetes mellitus without complication   . Hypertension   . Stroke    History reviewed. No pertinent past surgical history. No family history on file. History  Substance Use Topics  . Smoking status: Not on file  . Smokeless tobacco: Not on file  . Alcohol Use: No   OB History   Grav Para Term Preterm Abortions TAB SAB Ect Mult Living                 Review of Systems  Constitutional: Positive for appetite change.  Respiratory: Negative for shortness of breath.   Cardiovascular: Negative for chest pain.  Gastrointestinal: Negative for abdominal pain.  All other systems reviewed and are negative.    Allergies  Codeine and Ibuprofen  Home Medications   Current Outpatient Rx  Name  Route  Sig  Dispense  Refill  . atenolol-chlorthalidone (TENORETIC) 50-25 MG per tablet   Oral   Take 1 tablet by mouth daily.   30 tablet   1   . atorvastatin (LIPITOR) 10 MG tablet   Oral   Take 1 tablet (10 mg total) by mouth daily at 6 PM.   30 tablet   0   . clopidogrel (PLAVIX) 75 MG tablet   Oral   Take 1  tablet (75 mg total) by mouth daily with breakfast.   30 tablet   1   . diltiazem (CARDIZEM CD) 360 MG 24 hr capsule   Oral   Take 1 capsule (360 mg total) by mouth daily.   30 capsule   1   . ezetimibe (ZETIA) 10 MG tablet   Oral   Take 1 tablet (10 mg total) by mouth daily.   30 tablet   1   . glipiZIDE-metformin (METAGLIP) 5-500 MG per tablet   Oral   Take 1-2 tablets by mouth 2 (two) times daily before a meal. Takes 2 tablets in the morning and 1 tablet at bedtime         . HYDROcodone-acetaminophen (NORCO/VICODIN) 5-325 MG per tablet   Oral   Take 1 tablet by mouth every 6 (six) hours as needed.   60 tablet   0   . losartan (COZAAR) 100 MG tablet   Oral   Take 1 tablet (100 mg total) by mouth daily.   30 tablet   1   . pantoprazole (PROTONIX) 40 MG tablet   Oral   Take 1 tablet (40 mg total) by mouth daily.   30 tablet   1   . polyethylene glycol (MIRALAX / GLYCOLAX) packet   Oral   Take 17 g by mouth 2 (two) times  daily.   14 each   0    BP 134/57  Pulse 70  Temp(Src) 98.1 F (36.7 C) (Oral)  Resp 20  SpO2 99% Physical Exam  Constitutional: She is oriented to person, place, and time. She appears well-developed and well-nourished. No distress.  HENT:  Head: Normocephalic and atraumatic.  Right Ear: Hearing normal.  Left Ear: Hearing normal.  Nose: Nose normal.  Mouth/Throat: Oropharynx is clear and moist and mucous membranes are normal.  Eyes: Conjunctivae and EOM are normal. Pupils are equal, round, and reactive to light.  Neck: Normal range of motion. Neck supple.  Cardiovascular: Regular rhythm, S1 normal and S2 normal.  Exam reveals no gallop and no friction rub.   No murmur heard. Pulmonary/Chest: Effort normal and breath sounds normal. No respiratory distress. She exhibits no tenderness.  Abdominal: Soft. Normal appearance and bowel sounds are normal. There is no hepatosplenomegaly. There is no tenderness. There is no rebound, no guarding,  no tenderness at McBurney's point and negative Murphy's sign. No hernia.  Musculoskeletal: Normal range of motion.  Neurological: She is alert and oriented to person, place, and time. She has normal strength. No cranial nerve deficit or sensory deficit. Coordination normal. GCS eye subscore is 4. GCS verbal subscore is 5. GCS motor subscore is 6.  Skin: Skin is warm, dry and intact. No rash noted. No cyanosis.  Psychiatric: She has a normal mood and affect. Her speech is normal and behavior is normal. Thought content normal.    ED Course  Procedures (including critical care time) Labs Reviewed  CBC WITH DIFFERENTIAL - Abnormal; Notable for the following:    RBC 3.40 (*)    Hemoglobin 10.6 (*)    HCT 31.4 (*)    All other components within normal limits  COMPREHENSIVE METABOLIC PANEL - Abnormal; Notable for the following:    Glucose, Bld 106 (*)    BUN 26 (*)    Creatinine, Ser 1.58 (*)    GFR calc non Af Amer 33 (*)    GFR calc Af Amer 39 (*)    All other components within normal limits  URINALYSIS, ROUTINE W REFLEX MICROSCOPIC   Dg Abd Acute W/chest  07/28/2012   *RADIOLOGY REPORT*  Clinical Data: Abdominal pain.  ACUTE ABDOMEN SERIES (ABDOMEN 2 VIEW & CHEST 1 VIEW)  Comparison: 06/23/2012.  Chest x-ray 06/20/2012.  Findings: Gas scattered throughout nondistended large and small bowel.  No evidence of bowel obstruction.  No free air organomegaly.  No suspicious calcification.  Heart and mediastinal contours are within normal limits.  No focal opacities or effusions.  No acute bony abnormality.  Degenerative changes in the hips.  IMPRESSION: No evidence of bowel obstruction or free air.  No acute cardiopulmonary disease.   Original Report Authenticated By: Charlett Nose, M.D.   Diagnosis: Abdominal pain  MDM  Patient presents to ER for evaluation of vague complaints. Patient is a poor historian. She apparently failed triage nurse that she was experiencing abdominal pain, but when I  examined her and interviewed her, she denies pain. Exam is unremarkable. Abdominal exam nontender. She reports poor by mouth intake and change in appetite. Her examination was unremarkable. She is not experiencing any recurrent stroke symptoms. Is reassured, she has followup with her new primary doctor in one week. Continue to take all her regular medications.  Gilda Crease, MD 07/28/12 [phone]

## 2012-10-17 ENCOUNTER — Emergency Department (HOSPITAL_COMMUNITY): Payer: PRIVATE HEALTH INSURANCE

## 2012-10-17 ENCOUNTER — Observation Stay (HOSPITAL_COMMUNITY)
Admission: EM | Admit: 2012-10-17 | Discharge: 2012-10-20 | Disposition: A | Discharge status: Home / Self Care | Payer: PRIVATE HEALTH INSURANCE | Attending: Internal Medicine | Admitting: Internal Medicine

## 2012-10-17 ENCOUNTER — Encounter (HOSPITAL_COMMUNITY): Payer: Self-pay | Admitting: Emergency Medicine

## 2012-10-17 DIAGNOSIS — M545 Low back pain, unspecified: Secondary | ICD-10-CM | POA: Diagnosis present

## 2012-10-17 DIAGNOSIS — Z8673 Personal history of transient ischemic attack (TIA), and cerebral infarction without residual deficits: Secondary | ICD-10-CM | POA: Diagnosis not present

## 2012-10-17 DIAGNOSIS — R200 Anesthesia of skin: Secondary | ICD-10-CM | POA: Diagnosis present

## 2012-10-17 DIAGNOSIS — R209 Unspecified disturbances of skin sensation: Secondary | ICD-10-CM | POA: Diagnosis not present

## 2012-10-17 DIAGNOSIS — Z23 Encounter for immunization: Secondary | ICD-10-CM | POA: Insufficient documentation

## 2012-10-17 DIAGNOSIS — E876 Hypokalemia: Secondary | ICD-10-CM

## 2012-10-17 DIAGNOSIS — M129 Arthropathy, unspecified: Secondary | ICD-10-CM

## 2012-10-17 DIAGNOSIS — M519 Unspecified thoracic, thoracolumbar and lumbosacral intervertebral disc disorder: Secondary | ICD-10-CM | POA: Insufficient documentation

## 2012-10-17 DIAGNOSIS — I10 Essential (primary) hypertension: Secondary | ICD-10-CM | POA: Diagnosis present

## 2012-10-17 DIAGNOSIS — M4802 Spinal stenosis, cervical region: Secondary | ICD-10-CM | POA: Diagnosis not present

## 2012-10-17 DIAGNOSIS — R079 Chest pain, unspecified: Secondary | ICD-10-CM

## 2012-10-17 DIAGNOSIS — R799 Abnormal finding of blood chemistry, unspecified: Secondary | ICD-10-CM | POA: Diagnosis not present

## 2012-10-17 DIAGNOSIS — J069 Acute upper respiratory infection, unspecified: Secondary | ICD-10-CM

## 2012-10-17 DIAGNOSIS — R002 Palpitations: Secondary | ICD-10-CM | POA: Diagnosis not present

## 2012-10-17 DIAGNOSIS — I639 Cerebral infarction, unspecified: Secondary | ICD-10-CM

## 2012-10-17 DIAGNOSIS — F329 Major depressive disorder, single episode, unspecified: Secondary | ICD-10-CM | POA: Diagnosis not present

## 2012-10-17 DIAGNOSIS — F3289 Other specified depressive episodes: Secondary | ICD-10-CM

## 2012-10-17 DIAGNOSIS — K219 Gastro-esophageal reflux disease without esophagitis: Secondary | ICD-10-CM

## 2012-10-17 DIAGNOSIS — R9431 Abnormal electrocardiogram [ECG] [EKG]: Secondary | ICD-10-CM | POA: Diagnosis present

## 2012-10-17 DIAGNOSIS — M2550 Pain in unspecified joint: Secondary | ICD-10-CM

## 2012-10-17 DIAGNOSIS — I6330 Cerebral infarction due to thrombosis of unspecified cerebral artery: Secondary | ICD-10-CM

## 2012-10-17 DIAGNOSIS — E1169 Type 2 diabetes mellitus with other specified complication: Secondary | ICD-10-CM | POA: Diagnosis present

## 2012-10-17 DIAGNOSIS — R202 Paresthesia of skin: Secondary | ICD-10-CM

## 2012-10-17 DIAGNOSIS — E119 Type 2 diabetes mellitus without complications: Secondary | ICD-10-CM

## 2012-10-17 DIAGNOSIS — E785 Hyperlipidemia, unspecified: Secondary | ICD-10-CM | POA: Diagnosis present

## 2012-10-17 DIAGNOSIS — G8929 Other chronic pain: Secondary | ICD-10-CM | POA: Insufficient documentation

## 2012-10-17 LAB — URINALYSIS, ROUTINE W REFLEX MICROSCOPIC
Bilirubin Urine: NEGATIVE
Glucose, UA: NEGATIVE mg/dL
Hgb urine dipstick: NEGATIVE
Ketones, ur: 15 mg/dL — AB
Leukocytes, UA: NEGATIVE
Nitrite: NEGATIVE
Protein, ur: NEGATIVE mg/dL
Specific Gravity, Urine: 1.004 — ABNORMAL LOW (ref 1.005–1.030)
Urobilinogen, UA: 0.2 mg/dL (ref 0.0–1.0)
pH: 7.5 (ref 5.0–8.0)

## 2012-10-17 LAB — BASIC METABOLIC PANEL
BUN: 8 mg/dL (ref 6–23)
CO2: 25 mEq/L (ref 19–32)
Calcium: 9.2 mg/dL (ref 8.4–10.5)
Chloride: 98 mEq/L (ref 96–112)
Creatinine, Ser: 0.92 mg/dL (ref 0.50–1.10)
GFR calc Af Amer: 74 mL/min — ABNORMAL LOW (ref >90)
GFR calc non Af Amer: 64 mL/min — ABNORMAL LOW (ref >90)
Glucose, Bld: 145 mg/dL — ABNORMAL HIGH (ref 70–99)
Potassium: 3 mEq/L — ABNORMAL LOW (ref 3.5–5.1)
Sodium: 141 mEq/L (ref 135–145)

## 2012-10-17 LAB — CBC WITH DIFFERENTIAL/PLATELET
Basophils Absolute: 0 10*3/uL (ref 0.0–0.1)
Basophils Relative: 0 % (ref 0–1)
Eosinophils Absolute: 0 10*3/uL (ref 0.0–0.7)
Eosinophils Relative: 0 % (ref 0–5)
HCT: 36.2 % (ref 36.0–46.0)
Hemoglobin: 12.4 g/dL (ref 12.0–15.0)
Lymphocytes Relative: 13 % (ref 12–46)
Lymphs Abs: 1.4 10*3/uL (ref 0.7–4.0)
MCH: 31.1 pg (ref 26.0–34.0)
MCHC: 34.3 g/dL (ref 30.0–36.0)
MCV: 90.7 fL (ref 78.0–100.0)
Monocytes Absolute: 0.7 10*3/uL (ref 0.1–1.0)
Monocytes Relative: 6 % (ref 3–12)
Neutro Abs: 9.3 10*3/uL — ABNORMAL HIGH (ref 1.7–7.7)
Neutrophils Relative %: 81 % — ABNORMAL HIGH (ref 43–77)
Platelets: 229 10*3/uL (ref 150–400)
RBC: 3.99 MIL/uL (ref 3.87–5.11)
RDW: 12.9 % (ref 11.5–15.5)
WBC: 11.4 10*3/uL — ABNORMAL HIGH (ref 4.0–10.5)

## 2012-10-17 NOTE — ED Notes (Signed)
Pt states that she can hear her pulse in her ears all the time.

## 2012-10-17 NOTE — ED Notes (Addendum)
Report per EMS. Pt was watching TV when she started to feel like her heart was racing. Pt is tearful.

## 2012-10-17 NOTE — ED Provider Notes (Signed)
CSN: [id number]     Arrival date & time 10/17/12  2206 History   First MD Initiated Contact with Patient 10/17/12 2304     Chief Complaint  Patient presents with  . Palpitations   (Consider location/radiation/quality/duration/timing/severity/associated sxs/prior Treatment) The history is provided by the patient.  [name] is a 67 y.o. female hx of DM, HTN, recent stroke here with palpitations and numbness. Intermittent palpitations since 8 pm tonight. Afterwards, she had some L arm and leg numbness that was similar to her previous stroke in May. She had some diffuse weakness not worse on the left side. Denies any chest pain or shortness of breath.    Past Medical History  Diagnosis Date  . Diabetes mellitus without complication   . Hypertension   . Stroke    Past Surgical History  Procedure Laterality Date  . Abdominal hysterectomy     History reviewed. No pertinent family history. History  Substance Use Topics  . Smoking status: Never Smoker   . Smokeless tobacco: Not on file  . Alcohol Use: No   OB History   Grav Para Term Preterm Abortions TAB SAB Ect Mult Living                 Review of Systems  Cardiovascular: Positive for palpitations.  All other systems reviewed and are negative.    Allergies  Codeine and Ibuprofen  Home Medications   Current Outpatient Rx  Name  Route  Sig  Dispense  Refill  . atenolol-chlorthalidone (TENORETIC) 50-25 MG per tablet   Oral   Take 1 tablet by mouth daily.   30 tablet   1   . atorvastatin (LIPITOR) 10 MG tablet   Oral   Take 1 tablet (10 mg total) by mouth daily at 6 PM.   30 tablet   0   . cloNIDine (CATAPRES) 0.2 MG tablet   Oral   Take 0.2 mg by mouth 2 (two) times daily.         . clopidogrel (PLAVIX) 75 MG tablet   Oral   Take 1 tablet (75 mg total) by mouth daily with breakfast.   30 tablet   1   . dicyclomine (BENTYL) 20 MG tablet   Oral   Take 1 tablet (20 mg total) by mouth 2 (two) times  daily.   20 tablet   0   . diltiazem (CARDIZEM CD) 360 MG 24 hr capsule   Oral   Take 1 capsule (360 mg total) by mouth daily.   30 capsule   1   . ezetimibe (ZETIA) 10 MG tablet   Oral   Take 1 tablet (10 mg total) by mouth daily.   30 tablet   1   . famotidine (PEPCID) 20 MG tablet   Oral   Take 20 mg by mouth daily.          Marland Kitchen glipiZIDE-metformin (METAGLIP) 5-500 MG per tablet   Oral   Take 1-2 tablets by mouth 2 (two) times daily before a meal. Takes 2 tablets in the morning and 1 tablet at bedtime         . hydrALAZINE (APRESOLINE) 50 MG tablet   Oral   Take 50 mg by mouth 3 (three) times daily.         Marland Kitchen losartan (COZAAR) 100 MG tablet   Oral   Take 1 tablet (100 mg total) by mouth daily.   30 tablet   1   . pantoprazole (PROTONIX)  40 MG tablet   Oral   Take 1 tablet (40 mg total) by mouth daily.   30 tablet   1   . simethicone (MYLICON) 80 MG chewable tablet   Oral   Chew 80 mg by mouth every 4 (four) hours as needed for flatulence.         . traMADol (ULTRAM) 50 MG tablet   Oral   Take 50 mg by mouth every 6 (six) hours as needed for pain.          . traZODone (DESYREL) 50 MG tablet   Oral   Take 50 mg by mouth at bedtime.           BP 180/57  Pulse 83  Temp(Src) 99.1 F (37.3 C) (Oral)  Resp 16  Ht 5\' 6"  (1.676 m)  Wt 149 lb (67.586 kg)  BMI 24.06 kg/m2  SpO2 97% Physical Exam  Nursing note and vitals reviewed. Constitutional: She is oriented to person, place, and time. She appears well-developed and well-nourished.  Slightly anxious   HENT:  Head: Normocephalic.  Mouth/Throat: Oropharynx is clear and moist.  Eyes: Conjunctivae are normal. Pupils are equal, round, and reactive to light.  Neck: Normal range of motion. Neck supple.  Cardiovascular: Normal rate, regular rhythm and normal heart sounds.   Pulmonary/Chest: Effort normal and breath sounds normal. No respiratory distress. She has no wheezes. She has no rales.   Abdominal: Soft. Bowel sounds are normal. She exhibits no distension. There is no tenderness. There is no rebound and no guarding.  Musculoskeletal: Normal range of motion.  Neurological: She is alert and oriented to person, place, and time.  Slightly dec sensation L arm and leg. Nl strength throughout.   Skin: Skin is warm and dry.  Psychiatric: She has a normal mood and affect. Her behavior is normal. Judgment and thought content normal.    ED Course  Procedures (including critical care time) Labs Review Labs Reviewed  URINALYSIS, ROUTINE W REFLEX MICROSCOPIC - Abnormal; Notable for the following:    Specific Gravity, Urine 1.004 (*)    Ketones, ur 15 (*)    All other components within normal limits  CBC WITH DIFFERENTIAL - Abnormal; Notable for the following:    WBC 11.4 (*)    Neutrophils Relative % 81 (*)    Neutro Abs 9.3 (*)    All other components within normal limits  BASIC METABOLIC PANEL - Abnormal; Notable for the following:    Potassium 3.0 (*)    Glucose, Bld 145 (*)    GFR calc non Af Amer 64 (*)    GFR calc Af Amer 74 (*)    All other components within normal limits  TROPONIN I  MAGNESIUM    Date: 10/17/2012- 1  Rate: 81  Rhythm: normal sinus rhythm  QRS Axis: normal  Intervals: QT prolonged  ST/T Wave abnormalities: nonspecific ST changes  Conduction Disutrbances:none  Narrative Interpretation: QTC prolonged but similar to previous   Old EKG Reviewed: unchanged   Date: 10/17/2012- 2  Rate: 81  Rhythm: normal sinus rhythm  QRS Axis: normal  Intervals: QT prolonged  ST/T Wave abnormalities: nonspecific ST changes  Conduction Disutrbances:none  Narrative Interpretation:   Old EKG Reviewed: unchanged    Imaging Review Dg Chest 2 View  10/17/2012   *RADIOLOGY REPORT*  Clinical Data: Palpitations.  CHEST - 2 VIEW  Comparison: Chest radiograph July 28, 2012.  Findings: Cardiomediastinal silhouette is unremarkable and unchanged.  Mild chronic  interstitial changes  without pleural effusions or focal consolidations.  Pulmonary vasculature is unremarkable.  Trachea projects midline and there is no pneumothorax.  The soft tissue planes and included osseous structures are not suspicious.  IMPRESSION: No acute cardiopulmonary process; stable appearance of the chest from July 28, 2012.   Original Report Authenticated By: Awilda Metro   Ct Head Wo Contrast  10/18/2012   *RADIOLOGY REPORT*  Clinical Data: Palpitations, tinnitus.  CT HEAD WITHOUT CONTRAST  Technique:  Contiguous axial images were obtained from the base of the skull through the vertex without contrast.  Comparison: MRI of the brain Jun 20, 2012.  Findings: The ventricles and sulci are normal for patient's age. No intraparenchymal hemorrhage, mass effect, midline shift to the or abnormal focal hypodensities.  No acute large vascular territory infarcts.  No abnormal extra-axial fluid collections.  Basal cisterns are patent. Visualized paranasal sinuses and mastoid air cells appear well aerated.  No skull fracture.  Included ocular globes and orbital contents are not suspicious but not tailored for evaluation.  Soft tissue within the bilateral external auditory canals likely reflective of cerumen.  IMPRESSION: No acute intracranial process; normal noncontrast CT of the head for age.   Original Report Authenticated By: Awilda Metro    MDM  No diagnosis found. [name] is a 67 y.o. female here with palpitations and some L sided numbness. QT prolonged but its chronic. Not tachycardic here. I doubt ACS or PE. Likely anxiety. Arrhythmias possible given prolonged QT. Will call neuro and get CT head to workup L sided numbness.   1:34 AM Neuro saw patient recommend MRI to r/o stroke. QT prolonged. No arrhythmias on monitor. Will admit to tele for monitoring. Dr. Onalee Hua is the accepting doc.    Richardean Canal, MD 10/18/12 [phone]

## 2012-10-18 ENCOUNTER — Observation Stay (HOSPITAL_COMMUNITY): Payer: PRIVATE HEALTH INSURANCE

## 2012-10-18 DIAGNOSIS — R002 Palpitations: Secondary | ICD-10-CM | POA: Diagnosis not present

## 2012-10-18 DIAGNOSIS — R209 Unspecified disturbances of skin sensation: Secondary | ICD-10-CM

## 2012-10-18 DIAGNOSIS — R200 Anesthesia of skin: Secondary | ICD-10-CM | POA: Diagnosis present

## 2012-10-18 DIAGNOSIS — E119 Type 2 diabetes mellitus without complications: Secondary | ICD-10-CM

## 2012-10-18 DIAGNOSIS — R9431 Abnormal electrocardiogram [ECG] [EKG]: Secondary | ICD-10-CM | POA: Diagnosis present

## 2012-10-18 DIAGNOSIS — F329 Major depressive disorder, single episode, unspecified: Secondary | ICD-10-CM

## 2012-10-18 DIAGNOSIS — I10 Essential (primary) hypertension: Secondary | ICD-10-CM

## 2012-10-18 LAB — HEMOGLOBIN A1C
Hgb A1c MFr Bld: 6.5 % — ABNORMAL HIGH (ref <5.7)
Mean Plasma Glucose: 140 mg/dL — ABNORMAL HIGH (ref <117)

## 2012-10-18 LAB — TSH: TSH: 1.413 u[IU]/mL (ref 0.350–4.500)

## 2012-10-18 LAB — GLUCOSE, CAPILLARY
Glucose-Capillary: 185 mg/dL — ABNORMAL HIGH (ref 70–99)
Glucose-Capillary: 92 mg/dL (ref 70–99)
Glucose-Capillary: 142 mg/dL — ABNORMAL HIGH (ref 70–99)

## 2012-10-18 LAB — MAGNESIUM: Magnesium: 1.6 mg/dL (ref 1.5–2.5)

## 2012-10-18 LAB — TROPONIN I: Troponin I: <0.3 ng/mL (ref <0.30)

## 2012-10-18 MED ORDER — CHLORTHALIDONE 25 MG PO TABS
25.0000 mg | ORAL_TABLET | Freq: Every day | ORAL | Status: DC
  Administered 2012-10-18: 25 mg via ORAL
  Filled 2012-10-18 (×2): qty 1

## 2012-10-18 MED ORDER — ATENOLOL 50 MG PO TABS
50.0000 mg | ORAL_TABLET | Freq: Every day | ORAL | Status: DC
  Administered 2012-10-18: 50 mg via ORAL
  Filled 2012-10-18 (×2): qty 1

## 2012-10-18 MED ORDER — ONDANSETRON HCL 4 MG PO TABS
4.0000 mg | ORAL_TABLET | Freq: Four times a day (QID) | ORAL | Status: DC | PRN
  Administered 2012-10-19: 4 mg via ORAL
  Filled 2012-10-18: qty 1

## 2012-10-18 MED ORDER — EZETIMIBE 10 MG PO TABS
10.0000 mg | ORAL_TABLET | Freq: Every day | ORAL | Status: DC
  Administered 2012-10-18 – 2012-10-20 (×3): 10 mg via ORAL
  Filled 2012-10-18 (×3): qty 1

## 2012-10-18 MED ORDER — PANTOPRAZOLE SODIUM 40 MG PO TBEC
40.0000 mg | DELAYED_RELEASE_TABLET | Freq: Every day | ORAL | Status: DC
  Administered 2012-10-18 – 2012-10-19 (×2): 40 mg via ORAL
  Filled 2012-10-18 (×2): qty 1

## 2012-10-18 MED ORDER — CLONIDINE HCL 0.2 MG PO TABS
0.2000 mg | ORAL_TABLET | Freq: Two times a day (BID) | ORAL | Status: DC
  Administered 2012-10-18 (×2): 0.2 mg via ORAL
  Filled 2012-10-18 (×5): qty 1

## 2012-10-18 MED ORDER — ATENOLOL-CHLORTHALIDONE 50-25 MG PO TABS: 1.0000 | ORAL_TABLET | Freq: Every day | ORAL | Status: DC

## 2012-10-18 MED ORDER — MORPHINE SULFATE 2 MG/ML IJ SOLN
2.0000 mg | Freq: Once | INTRAMUSCULAR | Status: AC
  Administered 2012-10-18: 2 mg via INTRAVENOUS
  Filled 2012-10-18: qty 1

## 2012-10-18 MED ORDER — ONDANSETRON HCL 4 MG/2ML IJ SOLN: 4.0000 mg | Freq: Four times a day (QID) | INTRAMUSCULAR | Status: DC | PRN

## 2012-10-18 MED ORDER — SODIUM CHLORIDE 0.9 % IV SOLN
250.0000 mL | INTRAVENOUS | Status: DC | PRN
  Administered 2012-10-18: 250 mL via INTRAVENOUS

## 2012-10-18 MED ORDER — INFLUENZA VAC SPLIT QUAD 0.5 ML IM SUSP
0.5000 mL | INTRAMUSCULAR | Status: AC
  Administered 2012-10-19: 0.5 mL via INTRAMUSCULAR
  Filled 2012-10-18: qty 0.5

## 2012-10-18 MED ORDER — HYDRALAZINE HCL 20 MG/ML IJ SOLN
10.0000 mg | Freq: Four times a day (QID) | INTRAMUSCULAR | Status: DC | PRN
Start: 2012-10-18 — End: 2012-10-20

## 2012-10-18 MED ORDER — ATORVASTATIN CALCIUM 10 MG PO TABS
10.0000 mg | ORAL_TABLET | Freq: Every day | ORAL | Status: DC
  Administered 2012-10-18 – 2012-10-19 (×2): 10 mg via ORAL
  Filled 2012-10-18 (×3): qty 1

## 2012-10-18 MED ORDER — DILTIAZEM HCL ER COATED BEADS 360 MG PO CP24
360.0000 mg | ORAL_CAPSULE | Freq: Every day | ORAL | Status: DC
  Administered 2012-10-18: 360 mg via ORAL
  Filled 2012-10-18 (×2): qty 1

## 2012-10-18 MED ORDER — HYDROCODONE-ACETAMINOPHEN 5-325 MG PO TABS
1.0000 | ORAL_TABLET | ORAL | Status: DC | PRN
  Administered 2012-10-18: 2 via ORAL
  Administered 2012-10-19: 1 via ORAL
  Administered 2012-10-20: 2 via ORAL
  Filled 2012-10-18 (×2): qty 2
  Filled 2012-10-18: qty 1

## 2012-10-18 MED ORDER — SODIUM CHLORIDE 0.9 % IJ SOLN
3.0000 mL | Freq: Two times a day (BID) | INTRAMUSCULAR | Status: DC
  Administered 2012-10-18: 3 mL via INTRAVENOUS

## 2012-10-18 MED ORDER — INSULIN ASPART 100 UNIT/ML ~~LOC~~ SOLN
0.0000 [IU] | Freq: Three times a day (TID) | SUBCUTANEOUS | Status: DC
  Administered 2012-10-18: 2 [IU] via SUBCUTANEOUS

## 2012-10-18 MED ORDER — CLOPIDOGREL BISULFATE 75 MG PO TABS
75.0000 mg | ORAL_TABLET | Freq: Every day | ORAL | Status: DC
  Administered 2012-10-18 – 2012-10-20 (×3): 75 mg via ORAL
  Filled 2012-10-18 (×4): qty 1

## 2012-10-18 MED ORDER — HYDRALAZINE HCL 50 MG PO TABS
50.0000 mg | ORAL_TABLET | Freq: Three times a day (TID) | ORAL | Status: DC
  Administered 2012-10-18 – 2012-10-20 (×5): 50 mg via ORAL
  Filled 2012-10-18 (×9): qty 1

## 2012-10-18 MED ORDER — POTASSIUM CHLORIDE CRYS ER 20 MEQ PO TBCR
40.0000 meq | EXTENDED_RELEASE_TABLET | Freq: Once | ORAL | Status: AC
  Administered 2012-10-18: 40 meq via ORAL
  Filled 2012-10-18: qty 2

## 2012-10-18 MED ORDER — SODIUM CHLORIDE 0.9 % IJ SOLN: 3.0000 mL | INTRAMUSCULAR | Status: DC | PRN

## 2012-10-18 MED ORDER — ENSURE COMPLETE PO LIQD: 237.0000 mL | Freq: Two times a day (BID) | ORAL | Status: DC

## 2012-10-18 MED ORDER — SODIUM CHLORIDE 0.9 % IJ SOLN
3.0000 mL | Freq: Two times a day (BID) | INTRAMUSCULAR | Status: DC
  Administered 2012-10-18 – 2012-10-19 (×2): 3 mL via INTRAVENOUS

## 2012-10-18 NOTE — Progress Notes (Signed)
UR Completed.  Zebedee Segundo Jane [phone] 10/18/2012  

## 2012-10-18 NOTE — Progress Notes (Signed)
67yo female admitted w/ QTc prolongation, pharmacy consulted to evaluate home medications.  Of pt's medications, the only one that is specifically known to cause QTc prolongation is trazodone, which pt uses at bedtime at a low dose.  Note that current inpatient meds include Zofran, which can cause QTc prolongation, usually not clinically significant though may require closer monitoring given current medical condition.  Vernard Gambles, PharmD, BCPS 10/18/2012 3:06 AM

## 2012-10-18 NOTE — Progress Notes (Signed)
[facility] PROGRESS NOTE  SHEVY YANEY ZOX:[id number] DOB: 08-06-1945 DOA: 10/17/2012 PCP: No PCP Per Patient  Assessment/Plan: Principal Problem:   Left sided numbness Active Problems:   HYPERTENSION   LOW BACK PAIN, CHRONIC   Diabetes mellitus   Prolonged QT interval   Palpitations    1. Left-sided paresthesiae: Patient presented with sensory symptoms, associated with palpitations. Physical examination revealed no focal neurologic deficit. Head CT scan was devoid of acute findings, and 12-Lead EKG showed SR. Dr Wyatt Portela provided neurology consultation, and recommended continue pre-admission ASA/Plavix. Brain MRI/MRA, as well as neck MRA were all negative for acute or concerning findings. Symptoms have resolved as of AM of 10/18/12.  2. Palpitations: No arrhythmias recorded on telemetric monitoring so far. No previous history of palpitations. May have been due to a paroxysmal tachyarrhythmia. Will continue telemetric monitoring. If recurs, may need event monitor. For TSH. 3.Prolonged QT interval: This was an incidental finding on 12-Lead EKG. Pre-admission Trazodone may be a possible culprit. This has been discontinued. 4. Diabetes mellitus: Patient has known type-2 DM, which appears reasonably controlled, based on random blood glucose of 145. HBA1C is pending. Will manage with SSI for now.  5. HTN: On pre-admission antihypertensives, and is normotensive this AM.  6. Hypokalemia: Likely due to diuretics. Repleting as indicated. May have contributed to symptoms.    Code Status: Full Code.  Family Communication:  Disposition Plan: To be determined.    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 Symptoms similar to when she had her tia earlier in the year. Patient is on Plavix/ASA per report. No slurred speech, no weakness. No chest pain. No SOB. No fevers. Does have a headache and  feels like her heart is pounding in her head. Having palpitations that awoke her at night. No vision changes. Admitted for further evaluation and management.   Consultants:  Dr Wyatt Portela, neurologist.   Procedures:  CXR.  Head CT scan.  Brain MRI/MRA.  Neck MRA.   Antibiotics:  N/A.   HPI/Subjective: Asymptomatic.  Objective: Vital signs in last 24 hours: Temp:  [98.4 F (36.9 C)-99.1 F (37.3 C)] 98.4 F (36.9 C) (09/27 0547) Pulse Rate:  [74-89] 78 (09/27 0547) Resp:  [16-24] 20 (09/27 0547) BP: (141-182)/(46-66) 155/57 mmHg (09/27 0547) SpO2:  [95 %-99 %] 95 % (09/27 0547) Weight:  [65.998 kg (145 lb 8 oz)-67.586 kg (149 lb)] 65.998 kg (145 lb 8 oz) (09/27 0245) Weight change:     Intake/Output from previous day:       Physical Exam: General: Comfortable, alert, communicative, fully oriented, not short of breath at rest.  HEENT:  No clinical pallor, no jaundice, no conjunctival injection or discharge. Hydration is satisfactory.  NECK:  Supple, JVP not seen, no carotid bruits, no palpable lymphadenopathy, no palpable goiter. CHEST:  Clinically clear to auscultation, no wheezes, no crackles. HEART:  Sounds 1 and 2 heard, normal, regular, no murmurs. ABDOMEN:  Full, soft, non-tender, no palpable organomegaly, no palpable masses, normal bowel sounds. GENITALIA:  Not examined. LOWER EXTREMITIES:  No pitting edema, palpable peripheral pulses. MUSCULOSKELETAL SYSTEM:  Generalized osteoarthritic changes, otherwise, normal. CENTRAL NERVOUS SYSTEM:  No focal neurologic deficit on gross examination.  Lab Results:  Recent Labs  10/17/12 2258  WBC 11.4*  HGB 12.4  HCT 36.2  PLT 229    Recent Labs  10/17/12 2258  NA 141  K 3.0*  CL  98  CO2 25  GLUCOSE 145*  BUN 8  CREATININE 0.92  CALCIUM 9.2   No results found for this or any previous visit (from the past 240 hour(s)).   Studies/Results: Dg Chest 2 View  10/17/2012   *RADIOLOGY REPORT*   Clinical Data: Palpitations.  CHEST - 2 VIEW  Comparison: Chest radiograph July 28, 2012.  Findings: Cardiomediastinal silhouette is unremarkable and unchanged.  Mild chronic interstitial changes without pleural effusions or focal consolidations.  Pulmonary vasculature is unremarkable.  Trachea projects midline and there is no pneumothorax.  The soft tissue planes and included osseous structures are not suspicious.  IMPRESSION: No acute cardiopulmonary process; stable appearance of the chest from July 28, 2012.   Original Report Authenticated By: Awilda Metro   Ct Head Wo Contrast  10/18/2012   *RADIOLOGY REPORT*  Clinical Data: Palpitations, tinnitus.  CT HEAD WITHOUT CONTRAST  Technique:  Contiguous axial images were obtained from the base of the skull through the vertex without contrast.  Comparison: MRI of the brain Jun 20, 2012.  Findings: The ventricles and sulci are normal for patient's age. No intraparenchymal hemorrhage, mass effect, midline shift to the or abnormal focal hypodensities.  No acute large vascular territory infarcts.  No abnormal extra-axial fluid collections.  Basal cisterns are patent. Visualized paranasal sinuses and mastoid air cells appear well aerated.  No skull fracture.  Included ocular globes and orbital contents are not suspicious but not tailored for evaluation.  Soft tissue within the bilateral external auditory canals likely reflective of cerumen.  IMPRESSION: No acute intracranial process; normal noncontrast CT of the head for age.   Original Report Authenticated By: Awilda Metro    Medications: Scheduled Meds: . atenolol  50 mg Oral Daily  . atorvastatin  10 mg Oral q1800  . chlorthalidone  25 mg Oral Daily  . cloNIDine  0.2 mg Oral BID  . clopidogrel  75 mg Oral Q breakfast  . diltiazem  360 mg Oral Daily  . ezetimibe  10 mg Oral Daily  . hydrALAZINE  50 mg Oral TID  . [START ON 10/19/2012] influenza vac split quadrivalent PF  0.5 mL Intramuscular  Tomorrow-1000  . pantoprazole  40 mg Oral Daily  . sodium chloride  3 mL Intravenous Q12H  . sodium chloride  3 mL Intravenous Q12H   Continuous Infusions:  PRN Meds:.sodium chloride, hydrALAZINE, HYDROcodone-acetaminophen, ondansetron (ZOFRAN) IV, ondansetron, sodium chloride    LOS: 1 day   Stefanie Hodgens,CHRISTOPHER  [facility] Pager [phone]. If 8PM-8AM, please contact night-coverage at [email or web address], password [facility] 10/18/2012, 8:50 AM  LOS: 1 day

## 2012-10-18 NOTE — Consult Note (Addendum)
NEURO HOSPITALIST CONSULT NOTE    Reason for Consult: RIGHT LEG-FOOT-ARM  numbness.  HPI:                                                                                                                                          [name] is an 67 y.o. 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 At times seems to be a poor historian, but she is adamant that there is not a change in her chronic left sided numbness but instead a new sensation of numbness of the right leg, foot, and occasionally the right arm that apparently started early this morning. Never had such symptoms in the right side before. She denies HA, vertigo, double vision, difficulty swallowing, unsteadiness, slurred speech, language or vision impairment.  Takes daily plavix and atorvastatin 10 mg daily for secondary stroke prevention. CT brain revealed no acute intracranial abnormality.  Past Medical History  Diagnosis Date  . Diabetes mellitus without complication   . Hypertension   . Stroke     Past Surgical History  Procedure Laterality Date  . Abdominal hysterectomy      History reviewed. No pertinent family history.   Social History:  reports that she has never smoked. She does not have any smokeless tobacco history on file. She reports that she does not drink alcohol or use illicit drugs.  Allergies  Allergen Reactions  . Codeine     REACTION: Hallucinations  . Ibuprofen     REACTION: Elevated Blood Pressure    MEDICATIONS:                                                                                                                     I have reviewed the patient's current medications.   ROS:  History obtained from the patient and chart review.  General ROS: negative for - chills, fatigue, fever, night sweats, weight gain or weight loss Psychological ROS: negative for - behavioral disorder, hallucinations, memory difficulties, mood swings or suicidal ideation Ophthalmic ROS: negative for - blurry vision, double vision, eye pain or loss of vision ENT ROS: negative for - epistaxis, nasal discharge, oral lesions, sore throat, tinnitus or vertigo Allergy and Immunology ROS: negative for - hives or itchy/watery eyes Hematological and Lymphatic ROS: negative for - bleeding problems, bruising or swollen lymph nodes Endocrine ROS: negative for - galactorrhea, hair pattern changes, polydipsia/polyuria or temperature intolerance Respiratory ROS: negative for - cough, hemoptysis, shortness of breath or wheezing Cardiovascular ROS: negative for - chest pain, dyspnea on exertion, or edema Gastrointestinal ROS: negative for - abdominal pain, diarrhea, hematemesis, nausea/vomiting or stool incontinence Genito-Urinary ROS: negative for - dysuria, hematuria, incontinence or urinary frequency/urgency Musculoskeletal ROS: negative for - joint swelling or muscular weakness Neurological ROS: as noted in HPI Dermatological ROS: negative for rash and skin lesion changes   Physical exam: pleasant female in no apparent distress. Blood pressure 180/57, pulse 83, temperature 99.1 F (37.3 C), temperature source Oral, resp. rate 16, height 5\' 6"  (1.676 m), weight 67.586 kg (149 lb), SpO2 97.00%. Head: normocephalic. Neck: supple, no bruits, no JVD. Cardiac: no murmurs. Lungs: clear. Abdomen: soft, no tender, no mass. Extremities: no edema.   Neurologic Examination:                                                                                                      Mental Status: Alert, awake, oriented x 4, thought content  appropriate. Comprehension, naming, and repetition intact  Speech fluent without evidence of aphasia.  Able to follow 3 step commands without difficulty. Cranial Nerves: II: Discs flat bilaterally; Visual fields grossly normal, pupils equal, round, reactive to light and accommodation III,IV, VI: ptosis not present, extra-ocular motions intact bilaterally V,VII: smile symmetric, facial light touch sensation normal bilaterally VIII: hearing normal bilaterally IX,X: gag reflex present XI: bilateral shoulder shrug XII: midline tongue extension Motor: Right : Upper extremity   5/5    Left:     Upper extremity   5/5  Lower extremity   5/5     Lower extremity   5/5 Tone and bulk:normal tone throughout; no atrophy noted Sensory: Pinprick and light touch intact throughout, bilaterally Deep Tendon Reflexes:  Right: Upper Extremity   Left: Upper extremity   biceps (C-5 to C-6) 2/4   biceps (C-5 to C-6) 2/4 tricep (C7) 2/4    triceps (C7) 2/4 Brachioradialis (C6) 2/4  Brachioradialis (C6) 2/4  Lower Extremity Lower Extremity  quadriceps (L-2 to L-4) 2/4   quadriceps (L-2 to L-4) 2/4 Achilles (S1) 2/4   Achilles (S1) 2/4  Plantars: Right: downgoing   Left: downgoing Cerebellar: normal finger-to-nose,  normal heel-to-shin test Gait:  No ataxia. CV: pulses palpable throughout    Lab Results  Component Value Date/Time   CHOL 189 06/21/2012  4:04 AM    Results for orders placed during the hospital encounter  of 10/17/12 (from the past 48 hour(s))  URINALYSIS, ROUTINE W REFLEX MICROSCOPIC     Status: Abnormal   Collection Time    10/17/12 10:50 PM      Result Value Range   Color, Urine YELLOW  YELLOW   APPearance CLEAR  CLEAR   Specific Gravity, Urine 1.004 (*) 1.005 - 1.030   pH 7.5  5.0 - 8.0   Glucose, UA NEGATIVE  NEGATIVE mg/dL   Hgb urine dipstick NEGATIVE  NEGATIVE   Bilirubin Urine NEGATIVE  NEGATIVE   Ketones, ur 15 (*) NEGATIVE mg/dL   Protein, ur NEGATIVE  NEGATIVE mg/dL    Urobilinogen, UA 0.2  0.0 - 1.0 mg/dL   Nitrite NEGATIVE  NEGATIVE   Leukocytes, UA NEGATIVE  NEGATIVE   Comment: MICROSCOPIC NOT DONE ON URINES WITH NEGATIVE PROTEIN, BLOOD, LEUKOCYTES, NITRITE, OR GLUCOSE <1000 mg/dL.  CBC WITH DIFFERENTIAL     Status: Abnormal   Collection Time    10/17/12 10:58 PM      Result Value Range   WBC 11.4 (*) 4.0 - 10.5 K/uL   RBC 3.99  3.87 - 5.11 MIL/uL   Hemoglobin 12.4  12.0 - 15.0 g/dL   HCT 98.1  19.1 - 47.8 %   MCV 90.7  78.0 - 100.0 fL   MCH 31.1  26.0 - 34.0 pg   MCHC 34.3  30.0 - 36.0 g/dL   RDW 29.5  62.1 - 30.8 %   Platelets 229  150 - 400 K/uL   Neutrophils Relative % 81 (*) 43 - 77 %   Neutro Abs 9.3 (*) 1.7 - 7.7 K/uL   Lymphocytes Relative 13  12 - 46 %   Lymphs Abs 1.4  0.7 - 4.0 K/uL   Monocytes Relative 6  3 - 12 %   Monocytes Absolute 0.7  0.1 - 1.0 K/uL   Eosinophils Relative 0  0 - 5 %   Eosinophils Absolute 0.0  0.0 - 0.7 K/uL   Basophils Relative 0  0 - 1 %   Basophils Absolute 0.0  0.0 - 0.1 K/uL  BASIC METABOLIC PANEL     Status: Abnormal   Collection Time    10/17/12 10:58 PM      Result Value Range   Sodium 141  135 - 145 mEq/L   Potassium 3.0 (*) 3.5 - 5.1 mEq/L   Chloride 98  96 - 112 mEq/L   CO2 25  19 - 32 mEq/L   Glucose, Bld 145 (*) 70 - 99 mg/dL   BUN 8  6 - 23 mg/dL   Creatinine, Ser 6.57  0.50 - 1.10 mg/dL   Calcium 9.2  8.4 - 84.6 mg/dL   GFR calc non Af Amer 64 (*) >90 mL/min   GFR calc Af Amer 74 (*) >90 mL/min   Comment: (NOTE)     The eGFR has been calculated using the CKD EPI equation.     This calculation has not been validated in all clinical situations.     eGFR's persistently <90 mL/min signify possible Chronic Kidney     Disease.  TROPONIN I     Status: None   Collection Time    10/17/12 11:15 PM      Result Value Range   Troponin I <0.30  <0.30 ng/mL   Comment:            Due to the release kinetics of cTnI,     a negative result within the first hours  of the onset of symptoms  does not rule out     myocardial infarction with certainty.     If myocardial infarction is still suspected,     repeat the test at appropriate intervals.    Dg Chest 2 View  10/17/2012   *RADIOLOGY REPORT*  Clinical Data: Palpitations.  CHEST - 2 VIEW  Comparison: Chest radiograph July 28, 2012.  Findings: Cardiomediastinal silhouette is unremarkable and unchanged.  Mild chronic interstitial changes without pleural effusions or focal consolidations.  Pulmonary vasculature is unremarkable.  Trachea projects midline and there is no pneumothorax.  The soft tissue planes and included osseous structures are not suspicious.  IMPRESSION: No acute cardiopulmonary process; stable appearance of the chest from July 28, 2012.   Original Report Authenticated By: Awilda Metro   Ct Head Wo Contrast  10/18/2012   *RADIOLOGY REPORT*  Clinical Data: Palpitations, tinnitus.  CT HEAD WITHOUT CONTRAST  Technique:  Contiguous axial images were obtained from the base of the skull through the vertex without contrast.  Comparison: MRI of the brain Jun 20, 2012.  Findings: The ventricles and sulci are normal for patient's age. No intraparenchymal hemorrhage, mass effect, midline shift to the or abnormal focal hypodensities.  No acute large vascular territory infarcts.  No abnormal extra-axial fluid collections.  Basal cisterns are patent. Visualized paranasal sinuses and mastoid air cells appear well aerated.  No skull fracture.  Included ocular globes and orbital contents are not suspicious but not tailored for evaluation.  Soft tissue within the bilateral external auditory canals likely reflective of cerumen.  IMPRESSION: No acute intracranial process; normal noncontrast CT of the head for age.   Original Report Authenticated By: Awilda Metro     Assessment/Plan: 67 y/o with HTN, DM, stroke with residual left sided numbness, comes in with new onset paresthesias involving predominantly the right leg-foot and arm, as  well as a pulsatile tinnitus. Neuro-exam is non focal and CT brain unremarkable for acute abnormality. Admit to medicine and get MRI-DWI to ruled out new left subcortical stroke. New onset pulsatile tinnitus also merits MRA brain and neck. Continue plavix. Will follow up.   Wyatt Portela, MD [facility] [phone]  10/18/2012, 12:41 AM

## 2012-10-18 NOTE — Progress Notes (Signed)
Subjective: Patient feels that her symptoms improved but had another episode when she felt her heart racing and the symptoms returned.  She is very concerned about a stroke.    Objective: Current vital signs: BP 135/54  Pulse 71  Temp(Src) 98.2 F (36.8 C) (Oral)  Resp 20  Ht 5\' 6"  (1.676 m)  Wt 65.998 kg (145 lb 8 oz)  BMI 23.5 kg/m2  SpO2 97% Vital signs in last 24 hours: Temp:  [98.2 F (36.8 C)-99.1 F (37.3 C)] 98.2 F (36.8 C) (09/27 0920) Pulse Rate:  [71-89] 71 (09/27 0920) Resp:  [16-24] 20 (09/27 0920) BP: (135-182)/(46-66) 135/54 mmHg (09/27 0920) SpO2:  [95 %-99 %] 97 % (09/27 0920) Weight:  [65.998 kg (145 lb 8 oz)-67.586 kg (149 lb)] 65.998 kg (145 lb 8 oz) (09/27 0245)  Intake/Output from previous day:   Intake/Output this shift: Total I/O In: 240 [P.O.:240] Out: -  Nutritional status: Carb Control  Neurologic Exam: Mental Status:  Alert, awake, oriented x 4, thought content appropriate. Comprehension, naming, and repetition intact Speech fluent without evidence of aphasia. Able to follow 3 step commands without difficulty.  Cranial Nerves:  II: Discs flat bilaterally; Visual fields grossly normal, pupils equal, round, reactive to light and accommodation  III,IV, VI: ptosis not present, extra-ocular motions intact bilaterally  V,VII: smile symmetric, facial light touch sensation normal bilaterally  VIII: hearing normal bilaterally  IX,X: gag reflex present  XI: bilateral shoulder shrug  XII: midline tongue extension  Motor:  5/5 throughout Sensory: Pinprick and light touch intact throughout, bilaterally  Deep Tendon Reflexes:  2+ throughout Plantars:  Right: downgoing     Left: downgoing  Cerebellar:  normal finger-to-nose, normal heel-to-shin test   Lab Results: Basic Metabolic Panel:  Recent Labs Lab 10/17/12 2258 10/18/12 0131  NA 141  --   K 3.0*  --   CL 98  --   CO2 25  --   GLUCOSE 145*  --   BUN 8  --   CREATININE 0.92  --    CALCIUM 9.2  --   MG  --  1.6    Liver Function Tests: No results found for this basename: AST, ALT, ALKPHOS, BILITOT, PROT, ALBUMIN,  in the last 168 hours No results found for this basename: LIPASE, AMYLASE,  in the last 168 hours No results found for this basename: AMMONIA,  in the last 168 hours  CBC:  Recent Labs Lab 10/17/12 2258  WBC 11.4*  NEUTROABS 9.3*  HGB 12.4  HCT 36.2  MCV 90.7  PLT 229    Cardiac Enzymes:  Recent Labs Lab 10/17/12 2315  TROPONINI <0.30    Lipid Panel: No results found for this basename: CHOL, TRIG, HDL, CHOLHDL, VLDL, LDLCALC,  in the last 168 hours  CBG: No results found for this basename: GLUCAP,  in the last 168 hours  Microbiology: Results for orders placed during the hospital encounter of 11/17/11  URINE CULTURE     Status: None   Collection Time    11/17/11  1:44 PM      Result Value Range Status   Specimen Description URINE, CLEAN CATCH   Final   Special Requests Normal   Final   Culture  Setup Time 11/17/2011 21:10   Final   Colony Count 6,000 COLONIES/ML   Final   Culture INSIGNIFICANT GROWTH   Final   Report Status 11/18/2011 FINAL   Final  URINE CULTURE     Status: None   Collection  Time    11/17/11  3:33 PM      Result Value Range Status   Specimen Description URINE, RANDOM   Final   Special Requests NONE   Final   Culture  Setup Time 11/17/2011 21:09   Final   Colony Count 5,000 COLONIES/ML   Final   Culture INSIGNIFICANT GROWTH   Final   Report Status 11/18/2011 FINAL   Final    Coagulation Studies: No results found for this basename: LABPROT, INR,  in the last 72 hours  Imaging: Dg Chest 2 View  10/17/2012   *RADIOLOGY REPORT*  Clinical Data: Palpitations.  CHEST - 2 VIEW  Comparison: Chest radiograph July 28, 2012.  Findings: Cardiomediastinal silhouette is unremarkable and unchanged.  Mild chronic interstitial changes without pleural effusions or focal consolidations.  Pulmonary vasculature is  unremarkable.  Trachea projects midline and there is no pneumothorax.  The soft tissue planes and included osseous structures are not suspicious.  IMPRESSION: No acute cardiopulmonary process; stable appearance of the chest from July 28, 2012.   Original Report Authenticated By: Awilda Metro   Ct Head Wo Contrast  10/18/2012   *RADIOLOGY REPORT*  Clinical Data: Palpitations, tinnitus.  CT HEAD WITHOUT CONTRAST  Technique:  Contiguous axial images were obtained from the base of the skull through the vertex without contrast.  Comparison: MRI of the brain Jun 20, 2012.  Findings: The ventricles and sulci are normal for patient's age. No intraparenchymal hemorrhage, mass effect, midline shift to the or abnormal focal hypodensities.  No acute large vascular territory infarcts.  No abnormal extra-axial fluid collections.  Basal cisterns are patent. Visualized paranasal sinuses and mastoid air cells appear well aerated.  No skull fracture.  Included ocular globes and orbital contents are not suspicious but not tailored for evaluation.  Soft tissue within the bilateral external auditory canals likely reflective of cerumen.  IMPRESSION: No acute intracranial process; normal noncontrast CT of the head for age.   Original Report Authenticated By: Awilda Metro    Medications:  I have reviewed the patient's current medications. Scheduled: . atenolol  50 mg Oral Daily  . atorvastatin  10 mg Oral q1800  . chlorthalidone  25 mg Oral Daily  . cloNIDine  0.2 mg Oral BID  . clopidogrel  75 mg Oral Q breakfast  . diltiazem  360 mg Oral Daily  . ezetimibe  10 mg Oral Daily  . hydrALAZINE  50 mg Oral TID  . [START ON 10/19/2012] influenza vac split quadrivalent PF  0.5 mL Intramuscular Tomorrow-1000  . insulin aspart  0-9 Units Subcutaneous TID WC  . pantoprazole  40 mg Oral Daily  . potassium chloride  40 mEq Oral Once  . sodium chloride  3 mL Intravenous Q12H  . sodium chloride  3 mL Intravenous Q12H     Assessment/Plan: Patient stable.  Echocardiogram unremarkable.  Carotid doppler shows no hemodynamically significant stenosis.  Remaining work up pending  Recommendations: 1.  Will follow up results of imaging 2.  Continue Plavix   LOS: 1 day   Thana Farr, MD [facility] [phone] 10/18/2012  10:46 AM

## 2012-10-18 NOTE — ED Notes (Signed)
Dr. Yao at bedside. 

## 2012-10-18 NOTE — Progress Notes (Signed)
INITIAL NUTRITION ASSESSMENT  DOCUMENTATION CODES Per approved criteria  -Not Applicable   INTERVENTION: 1.  Supplements; Ensure Complete po BID, each supplement provides 350 kcal and 13 grams of protein.  NUTRITION DIAGNOSIS: Inadequate oral intake related to poor appetite as evidenced by pt report.   Monitor:  1.  Food/Beverage; pt meeting >/=90% estimated needs with tolerance. 2.  Wt/wt change; monitor trends  Reason for Assessment: MST  67 y.o. female  Admitting Dx: Left sided numbness  ASSESSMENT: Pt admitted with left sided numbness. Pt reports that since her stroke in May her appetite has been extremely poor.  She reports 10-15 lbs wt change (6% of her usual wt in 4 months) during that time. She reports meal skipping especially at dinner.  Pt states that she is waiting Medicare approval for an appetite stimulant her MD has ordered for her.  Pt drinks Ensure at home.  Nutrition Focused Physical Exam:  Subcutaneous Fat:  Orbital Region: mild-moderate wasting Upper Arm Region: WNL Thoracic and Lumbar Region: WNL  Muscle:  Temple Region: WNL Clavicle Bone Region: WNL Clavicle and Acromion Bone Region: WNL Scapular Bone Region: WNL Dorsal Hand: WNL Patellar Region: N/A Anterior Thigh Region: N/A Posterior Calf Region: N/A  Edema: none present  Height: Ht Readings from Last 1 Encounters:  10/18/12 5\' 6"  (1.676 m)    Weight: Wt Readings from Last 1 Encounters:  10/18/12 145 lb 8 oz (65.998 kg)    Ideal Body Weight: 130 lbs  % Ideal Body Weight: 111%  Wt Readings from Last 10 Encounters:  10/18/12 145 lb 8 oz (65.998 kg)  06/23/12 149 lb 12.8 oz (67.949 kg)  06/20/12 154 lb 8.7 oz (70.1 kg)  12/06/08 176 lb (79.833 kg)  11/26/08 177 lb (80.287 kg)    Usual Body Weight: 155 lbs  % Usual Body Weight: 93%  BMI:  Body mass index is 23.5 kg/(m^2).  Estimated Nutritional Needs: Kcal: 1840-1980 Protein: 70-85g Fluid: ~2.0 L/day  Skin: no issues  noted  Diet Order: Carb Control  EDUCATION NEEDS: -Education needs addressed   Intake/Output Summary (Last 24 hours) at 10/18/12 1224 Last data filed at 10/18/12 0921  Gross per 24 hour  Intake    240 ml  Output      0 ml  Net    240 ml    Last BM: PTA  Labs:   Recent Labs Lab 10/17/12 2258 10/18/12 0131  NA 141  --   K 3.0*  --   CL 98  --   CO2 25  --   BUN 8  --   CREATININE 0.92  --   CALCIUM 9.2  --   MG  --  1.6  GLUCOSE 145*  --     CBG (last 3)  No results found for this basename: GLUCAP,  in the last 72 hours  Scheduled Meds: . atenolol  50 mg Oral Daily  . atorvastatin  10 mg Oral q1800  . chlorthalidone  25 mg Oral Daily  . cloNIDine  0.2 mg Oral BID  . clopidogrel  75 mg Oral Q breakfast  . diltiazem  360 mg Oral Daily  . ezetimibe  10 mg Oral Daily  . hydrALAZINE  50 mg Oral TID  . [START ON 10/19/2012] influenza vac split quadrivalent PF  0.5 mL Intramuscular Tomorrow-1000  . insulin aspart  0-9 Units Subcutaneous TID WC  . pantoprazole  40 mg Oral Daily  . sodium chloride  3 mL Intravenous Q12H  .  sodium chloride  3 mL Intravenous Q12H    Continuous Infusions:   Past Medical History  Diagnosis Date  . Diabetes mellitus without complication   . Hypertension   . Stroke     Past Surgical History  Procedure Laterality Date  . Abdominal hysterectomy      Adriana Dys, MS RD LDN Clinical Inpatient Dietitian Pager: [phone] Weekend/After hours pager: [phone]

## 2012-10-18 NOTE — H&P (Signed)
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  Symptoms similar to when she had her tia earlier in the year.  Pt is on plavix and asa per report.  No slurred speech, no weakness.  No cp.  No sob.  No fevers.  Does have a headache and feels like her heart is pounding in her head.  Having palpitations that awoke her also tonight.  No vision changes.  Right now her only complaint is that she is hungry.  Review of Systems:  Positive and negative as per HPI otherwise all other systems are negative  Past Medical History: Past Medical History  Diagnosis Date  . Diabetes mellitus without complication   . Hypertension   . Stroke    Past Surgical History  Procedure Laterality Date  . Abdominal hysterectomy      Medications: Prior to Admission medications   Medication Sig Start Date End Date Taking? Authorizing Provider  atenolol-chlorthalidone (TENORETIC) 50-25 MG per tablet Take 1 tablet by mouth daily. 06/27/12  Yes Daniel J Angiulli, PA-C  atorvastatin (LIPITOR) 10 MG tablet Take 1 tablet (10 mg total) by mouth daily at 6 PM. 06/27/12  Yes Mcarthur Rossetti Angiulli, PA-C  cloNIDine (CATAPRES) 0.2 MG tablet Take 0.2 mg by mouth 2 (two) times daily.   Yes Historical Provider, MD  clopidogrel (PLAVIX) 75 MG tablet Take 1 tablet (75 mg total) by mouth daily with breakfast. 06/27/12  Yes Daniel J Angiulli, PA-C  dicyclomine (BENTYL) 20 MG tablet Take 1 tablet (20 mg total) by mouth 2 (two) times daily. 07/28/12  Yes Gilda Crease, MD  diltiazem (CARDIZEM CD) 360 MG 24 hr capsule Take 1 capsule (360 mg total) by mouth daily. 06/27/12  Yes Daniel J Angiulli, PA-C  ezetimibe (ZETIA) 10 MG tablet Take 1 tablet (10 mg total) by mouth daily. 06/27/12  Yes Daniel J Angiulli, PA-C  famotidine (PEPCID) 20 MG tablet Take 20 mg by mouth daily.  09/29/12  Yes Historical Provider, MD  glipiZIDE-metformin (METAGLIP) 5-500  MG per tablet Take 1-2 tablets by mouth 2 (two) times daily before a meal. Takes 2 tablets in the morning and 1 tablet at bedtime   Yes Historical Provider, MD  hydrALAZINE (APRESOLINE) 50 MG tablet Take 50 mg by mouth 3 (three) times daily.   Yes Historical Provider, MD  losartan (COZAAR) 100 MG tablet Take 1 tablet (100 mg total) by mouth daily. 06/27/12  Yes Daniel J Angiulli, PA-C  pantoprazole (PROTONIX) 40 MG tablet Take 1 tablet (40 mg total) by mouth daily. 06/27/12  Yes Daniel J Angiulli, PA-C  simethicone (MYLICON) 80 MG chewable tablet Chew 80 mg by mouth every 4 (four) hours as needed for flatulence.   Yes Historical Provider, MD  traMADol (ULTRAM) 50 MG tablet Take 50 mg by mouth every 6 (six) hours as needed for pain.  09/25/12  Yes Historical Provider, MD  traZODone (DESYREL) 50 MG tablet Take 50 mg by mouth at bedtime.  09/25/12  Yes Historical Provider, MD    Allergies:   Allergies  Allergen Reactions  . Codeine     REACTION: Hallucinations  . Ibuprofen     REACTION: Elevated Blood Pressure    Social History:  reports that she has never smoked. She does not have any smokeless tobacco history on file. She reports that she does not drink alcohol or use illicit drugs.  Family History:  History reviewed. No pertinent family history.  Physical Exam: Filed Vitals:   10/17/12 2220 10/17/12 2300 10/17/12 2315  BP: 141/46 166/46 180/57  Pulse:  89 83  Temp: 99.1 F (37.3 C)    TempSrc: Oral    Resp: 21 21 16   Height: 5\' 6"  (1.676 m)    Weight: 67.586 kg (149 lb)    SpO2: 98% 98% 97%   General appearance: alert, cooperative and no distress Head: Normocephalic, without obvious abnormality, atraumatic Eyes: negative Nose: Nares normal. Septum midline. Mucosa normal. No drainage or sinus tenderness. Neck: no JVD and supple, symmetrical, trachea midline Lungs: clear to auscultation bilaterally Heart: regular rate and rhythm, S1, S2 normal, no murmur, click, rub or  gallop Abdomen: soft, non-tender; bowel sounds normal; no masses,  no organomegaly Extremities: extremities normal, atraumatic, no cyanosis or edema Pulses: 2+ and symmetric Skin: Skin color, texture, turgor normal. No rashes or lesions Neurologic: Grossly normal    Labs on Admission:   Recent Labs  10/17/12 2258  NA 141  K 3.0*  CL 98  CO2 25  GLUCOSE 145*  BUN 8  CREATININE 0.92  CALCIUM 9.2    Recent Labs  10/17/12 2258  WBC 11.4*  NEUTROABS 9.3*  HGB 12.4  HCT 36.2  MCV 90.7  PLT 229    Recent Labs  10/17/12 2315  TROPONINI <0.30   Radiological Exams on Admission: Dg Chest 2 View  10/17/2012   *RADIOLOGY REPORT*  Clinical Data: Palpitations.  CHEST - 2 VIEW  Comparison: Chest radiograph July 28, 2012.  Findings: Cardiomediastinal silhouette is unremarkable and unchanged.  Mild chronic interstitial changes without pleural effusions or focal consolidations.  Pulmonary vasculature is unremarkable.  Trachea projects midline and there is no pneumothorax.  The soft tissue planes and included osseous structures are not suspicious.  IMPRESSION: No acute cardiopulmonary process; stable appearance of the chest from July 28, 2012.   Original Report Authenticated By: Awilda Metro   Ct Head Wo Contrast  10/18/2012   *RADIOLOGY REPORT*  Clinical Data: Palpitations, tinnitus.  CT HEAD WITHOUT CONTRAST  Technique:  Contiguous axial images were obtained from the base of the skull through the vertex without contrast.  Comparison: MRI of the brain Jun 20, 2012.  Findings: The ventricles and sulci are normal for patient's age. No intraparenchymal hemorrhage, mass effect, midline shift to the or abnormal focal hypodensities.  No acute large vascular territory infarcts.  No abnormal extra-axial fluid collections.  Basal cisterns are patent. Visualized paranasal sinuses and mastoid air cells appear well aerated.  No skull fracture.  Included ocular globes and orbital contents are not  suspicious but not tailored for evaluation.  Soft tissue within the bilateral external auditory canals likely reflective of cerumen.  IMPRESSION: No acute intracranial process; normal noncontrast CT of the head for age.   Original Report Authenticated By: Awilda Metro    Assessment/Plan  67 yo female with left sided n/t and prolonged qtc Principal Problem:   Left sided numbness Active Problems:   HYPERTENSION   LOW BACK PAIN, CHRONIC   Diabetes mellitus   Prolonged QT interval   Palpitations  Neuro has evaluated, recommend repeat mri/a in am.  Has been ordered.  Cont plavix for now.  Have gone thru home meds and cannot find any obvious culprit for prolonged qtc, will also obtain pharm consult to evaluated home meds that could be prolonging qt interval.  obs on tele.  freq neuro cks.  Full code.  Ercia Crisafulli A 10/18/2012,  1:32 AM

## 2012-10-19 ENCOUNTER — Observation Stay (HOSPITAL_COMMUNITY): Payer: PRIVATE HEALTH INSURANCE

## 2012-10-19 DIAGNOSIS — R002 Palpitations: Secondary | ICD-10-CM | POA: Diagnosis not present

## 2012-10-19 LAB — GLUCOSE, CAPILLARY
Glucose-Capillary: 103 mg/dL — ABNORMAL HIGH (ref 70–99)
Glucose-Capillary: 169 mg/dL — ABNORMAL HIGH (ref 70–99)
Glucose-Capillary: 116 mg/dL — ABNORMAL HIGH (ref 70–99)
Glucose-Capillary: 131 mg/dL — ABNORMAL HIGH (ref 70–99)

## 2012-10-19 LAB — BASIC METABOLIC PANEL
BUN: 10 mg/dL (ref 6–23)
CO2: 29 mEq/L (ref 19–32)
Calcium: 8.4 mg/dL (ref 8.4–10.5)
Chloride: 102 mEq/L (ref 96–112)
Creatinine, Ser: 1.06 mg/dL (ref 0.50–1.10)
GFR calc Af Amer: 62 mL/min — ABNORMAL LOW (ref >90)
GFR calc non Af Amer: 54 mL/min — ABNORMAL LOW (ref >90)
Glucose, Bld: 116 mg/dL — ABNORMAL HIGH (ref 70–99)
Potassium: 3.8 mEq/L (ref 3.5–5.1)
Sodium: 140 mEq/L (ref 135–145)

## 2012-10-19 MED ORDER — GADOBENATE DIMEGLUMINE 529 MG/ML IV SOLN
14.0000 mL | Freq: Once | INTRAVENOUS | Status: AC | PRN
  Administered 2012-10-19: 14 mL via INTRAVENOUS

## 2012-10-19 MED ORDER — MAGNESIUM CITRATE PO SOLN
1.0000 | Freq: Once | ORAL | Status: AC
  Administered 2012-10-19: 1 via ORAL
  Filled 2012-10-19: qty 296

## 2012-10-19 NOTE — Progress Notes (Signed)
[facility] PROGRESS NOTE  [name] ZYS:[id number] DOB: 13-Oct-1945 DOA: 10/17/2012 PCP: No PCP Per Patient  Assessment/Plan: Principal Problem:   Left sided numbness Active Problems:   HYPERTENSION   LOW BACK PAIN, CHRONIC   Diabetes mellitus   Prolonged QT interval   Palpitations    1. Left-sided paresthesiae: Patient presented with sensory symptoms, associated with palpitations. Physical examination revealed no focal neurologic deficit. Head CT scan was devoid of acute findings, and 12-Lead EKG showed SR. Dr Wyatt Portela provided neurology consultation, and recommended continue pre-admission ASA/Plavix. Brain MRI/MRA, as well as neck MRA were all negative for acute or concerning findings. Symptoms have resolved as of AM of 10/18/12. Per neurology recommendations, will arrange cervical spine MRI, to evaluate for significant DJD/Myelopathy.  2. Palpitations: No arrhythmias recorded on telemetric monitoring so far. No previous history of palpitations. May have been due to a paroxysmal tachyarrhythmia. Will continue telemetric monitoring. If recurs, may need event monitor. TSH is normal at 1.413. 3.Prolonged QT interval: This was an incidental finding on 12-Lead EKG. Pre-admission Trazodone may be a possible culprit. This has been discontinued. Patient has a mild hypokalemia, which was corrected. Will repeat EKG.  4. Diabetes mellitus: Patient has known type-2 DM, which appears reasonably controlled, based on random blood glucose of 145. HBA1C is 6.5. Managing with SSI for now. Patient will be discharged on pre-admission oral hypoglycemics. 5. HTN: On pre-admission antihypertensives, BP dropped to 83/67 last night. Antihypertensives have been held for now. Will re-introduce gingerly, later today. .  6. Hypokalemia: Likely due to diuretics. Repleting as indicated. May have contributed to symptoms.    Code Status: Full Code.  Family Communication:  Disposition Plan: To be  determined.    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 Symptoms similar to when she had her tia earlier in the year. Patient is on Plavix/ASA per report. No slurred speech, no weakness. No chest pain. No SOB. No fevers. Does have a headache and feels like her heart is pounding in her head. Having palpitations that awoke her at night. No vision changes. Admitted for further evaluation and management.   Consultants:  Dr Wyatt Portela, neurologist.   Procedures:  CXR.  Head CT scan.  Brain MRI/MRA.  Neck MRA.   Antibiotics:  N/A.   HPI/Subjective: Felt non-specifically unwell last night. Better this AM.  Objective: Vital signs in last 24 hours: Temp:  [98 F (36.7 C)-98.4 F (36.9 C)] 98.2 F (36.8 C) (09/28 0649) Pulse Rate:  [56-65] 65 (09/28 0649) Resp:  [18-20] 20 (09/28 0649) BP: (83-132)/(51-82) 117/82 mmHg (09/28 0649) SpO2:  [96 %-100 %] 96 % (09/28 0649) Weight change:     Intake/Output from previous day: 09/27 0701 - 09/28 0700 In: 240 [P.O.:240] Out: -      Physical Exam: General: Comfortable, alert, communicative, fully oriented, not short of breath at rest.  HEENT:  No clinical pallor, no jaundice, no conjunctival injection or discharge. Hydration is satisfactory.  NECK:  Supple, JVP not seen, no carotid bruits, no palpable lymphadenopathy, no palpable goiter. CHEST:  Clinically clear to auscultation, no wheezes, no crackles. HEART:  Sounds 1 and 2 heard, normal, regular, no murmurs. ABDOMEN:  Full, soft, non-tender, no palpable organomegaly, no palpable masses, normal bowel sounds. GENITALIA:  Not examined. LOWER EXTREMITIES:  No pitting edema, palpable peripheral pulses. MUSCULOSKELETAL SYSTEM:  Generalized osteoarthritic changes, otherwise, normal. CENTRAL NERVOUS SYSTEM:  No focal neurologic deficit on gross examination.  Lab Results:  Recent  Labs  10/17/12 2258  WBC 11.4*  HGB 12.4  HCT 36.2  PLT 229    Recent Labs  10/17/12 2258 10/19/12 0513  NA 141 140  K 3.0* 3.8  CL 98 102  CO2 25 29  GLUCOSE 145* 116*  BUN 8 10  CREATININE 0.92 1.06  CALCIUM 9.2 8.4   No results found for this or any previous visit (from the past 240 hour(s)).   Studies/Results: Dg Chest 2 View  10/17/2012   *RADIOLOGY REPORT*  Clinical Data: Palpitations.  CHEST - 2 VIEW  Comparison: Chest radiograph July 28, 2012.  Findings: Cardiomediastinal silhouette is unremarkable and unchanged.  Mild chronic interstitial changes without pleural effusions or focal consolidations.  Pulmonary vasculature is unremarkable.  Trachea projects midline and there is no pneumothorax.  The soft tissue planes and included osseous structures are not suspicious.  IMPRESSION: No acute cardiopulmonary process; stable appearance of the chest from July 28, 2012.   Original Report Authenticated By: Awilda Metro   Ct Head Wo Contrast  10/18/2012   *RADIOLOGY REPORT*  Clinical Data: Palpitations, tinnitus.  CT HEAD WITHOUT CONTRAST  Technique:  Contiguous axial images were obtained from the base of the skull through the vertex without contrast.  Comparison: MRI of the brain Jun 20, 2012.  Findings: The ventricles and sulci are normal for patient's age. No intraparenchymal hemorrhage, mass effect, midline shift to the or abnormal focal hypodensities.  No acute large vascular territory infarcts.  No abnormal extra-axial fluid collections.  Basal cisterns are patent. Visualized paranasal sinuses and mastoid air cells appear well aerated.  No skull fracture.  Included ocular globes and orbital contents are not suspicious but not tailored for evaluation.  Soft tissue within the bilateral external auditory canals likely reflective of cerumen.  IMPRESSION: No acute intracranial process; normal noncontrast CT of the head for age.   Original Report Authenticated By: Awilda Metro   Mr  [facility] Wo Contrast  10/18/2012   CLINICAL DATA:  67 year old female with history of hypertension and previous corpus callosum infarct. Headache, left side numbness and tingling, heart palpations.  EXAM: MRI HEAD WITHOUT CONTRAST AND MRA HEAD WITHOUT CONTRAST AND MRA NECK WITHOUT CONTRAST  TECHNIQUE: Multiplanar, multiecho pulse sequences of the brain and surrounding structures were obtained without intravenous contrast. Angiographic images of the head were obtained using MRA technique without contrast. Multiplanar, multiecho pulse sequences of the neck and surrounding structures were obtained without intravenous contrast.  COMPARISON:  Head CT without contrast 10/17/2012. Brain MRI and MRA 06/20/2012.  FINDINGS: MRI HEAD FINDINGS  No restricted diffusion or evidence of acute infarction. Interval resolved subtle T2 hyperintensity previously seen in the splenium of the corpus callosum. No encephalomalacia.  Major intracranial vascular flow voids are preserved.  No midline shift, mass effect, evidence of mass lesion, ventriculomegaly, extra-axial collection or acute intracranial hemorrhage. Cervicomedullary junction and pituitary are within normal limits. Negative visualized cervical spine. Minimal for age nonspecific cerebral white matter T2 and FLAIR hyperintensity elsewhere is stable.  Normal bone marrow signal. Visualized orbit soft tissues are within normal limits. Visualized paranasal sinuses and mastoids are clear. Negative scalp soft tissues.  MRA HEAD FINDINGS  Antegrade flow in the posterior circulation. Normal right PICA origin. Codominant distal vertebral arteries. Patent vertebrobasilar junction. Normal AICA origins. No basilar stenosis. SCA and PCA origins within normal limits. Diminutive left posterior communicating artery, the right is not identified. Bilateral PCA branches  within normal limits.  Antegrade flow in both ICA siphons. Normal ophthalmic artery origins. Mild ICA irregularity. No stenosis.  Normal carotid termini. Normal MCA and ACA origins. Normal visualized bilateral MCA branches.  Tortuous proximal ACA branches, more so the left A1 segment. The anterior communicating artery is mildly ectatic and there is a median artery of the corpus callosum, but no ACA aneurysm is identified. No ACA stenosis, and otherwise negative visualized ACA branches.  MRA NECK FINDINGS  Time-of-flight images demonstrate antegrade flow in both carotid and vertebral arteries in the neck. Carotid bifurcations appear within normal limits. No evidence of hemodynamically significant stenosis in the neck.  IMPRESSION: MRI HEAD IMPRESSION  No acute intracranial abnormality and normal for age non contrast MRI appearance of the brain. No residual signal abnormality in the splenium where diffusion with abnormal on the previous MRI.  MRA HEAD IMPRESSION  Negative intracranial MRA; mildly tortuous proximal ACA branches.  MRA NECK IMPRESSION  Negative non contrast neck MRA.   Electronically Signed   By: Augusto Gamble M.D.   On: 10/18/2012 11:40   Mr Angiogram Neck Wo Contrast  10/18/2012   CLINICAL DATA:  67 year old female with history of hypertension and previous corpus callosum infarct. Headache, left side numbness and tingling, heart palpations.  EXAM: MRI HEAD WITHOUT CONTRAST AND MRA HEAD WITHOUT CONTRAST AND MRA NECK WITHOUT CONTRAST  TECHNIQUE: Multiplanar, multiecho pulse sequences of the brain and surrounding structures were obtained without intravenous contrast. Angiographic images of the head were obtained using MRA technique without contrast. Multiplanar, multiecho pulse sequences of the neck and surrounding structures were obtained without intravenous contrast.  COMPARISON:  Head CT without contrast 10/17/2012. Brain MRI and MRA 06/20/2012.  FINDINGS: MRI HEAD FINDINGS  No restricted diffusion or evidence of acute infarction. Interval resolved subtle T2 hyperintensity previously seen in the splenium of the corpus callosum. No  encephalomalacia.  Major intracranial vascular flow voids are preserved.  No midline shift, mass effect, evidence of mass lesion, ventriculomegaly, extra-axial collection or acute intracranial hemorrhage. Cervicomedullary junction and pituitary are within normal limits. Negative visualized cervical spine. Minimal for age nonspecific cerebral white matter T2 and FLAIR hyperintensity elsewhere is stable.  Normal bone marrow signal. Visualized orbit soft tissues are within normal limits. Visualized paranasal sinuses and mastoids are clear. Negative scalp soft tissues.  MRA HEAD FINDINGS  Antegrade flow in the posterior circulation. Normal right PICA origin. Codominant distal vertebral arteries. Patent vertebrobasilar junction. Normal AICA origins. No basilar stenosis. SCA and PCA origins within normal limits. Diminutive left posterior communicating artery, the right is not identified. Bilateral PCA branches within normal limits.  Antegrade flow in both ICA siphons. Normal ophthalmic artery origins. Mild ICA irregularity. No stenosis. Normal carotid termini. Normal MCA and ACA origins. Normal visualized bilateral MCA branches.  Tortuous proximal ACA branches, more so the left A1 segment. The anterior communicating artery is mildly ectatic and there is a median artery of the corpus callosum, but no ACA aneurysm is identified. No ACA stenosis, and otherwise negative visualized ACA branches.  MRA NECK FINDINGS  Time-of-flight images demonstrate antegrade flow in both carotid and vertebral arteries in the neck. Carotid bifurcations appear within normal limits. No evidence of hemodynamically significant stenosis in the neck.  IMPRESSION: MRI HEAD IMPRESSION  No acute intracranial abnormality and normal for age non contrast MRI appearance of the brain. No residual signal abnormality in the splenium where diffusion with abnormal on the previous MRI.  MRA HEAD IMPRESSION  Negative intracranial MRA; mildly tortuous  proximal ACA  branches.  MRA NECK IMPRESSION  Negative non contrast neck MRA.   Electronically Signed   By: Augusto Gamble M.D.   On: 10/18/2012 11:40   Mr Brain Wo Contrast  10/18/2012   CLINICAL DATA:  67 year old female with history of hypertension and previous corpus callosum infarct. Headache, left side numbness and tingling, heart palpations.  EXAM: MRI HEAD WITHOUT CONTRAST AND MRA HEAD WITHOUT CONTRAST AND MRA NECK WITHOUT CONTRAST  TECHNIQUE: Multiplanar, multiecho pulse sequences of the brain and surrounding structures were obtained without intravenous contrast. Angiographic images of the head were obtained using MRA technique without contrast. Multiplanar, multiecho pulse sequences of the neck and surrounding structures were obtained without intravenous contrast.  COMPARISON:  Head CT without contrast 10/17/2012. Brain MRI and MRA 06/20/2012.  FINDINGS: MRI HEAD FINDINGS  No restricted diffusion or evidence of acute infarction. Interval resolved subtle T2 hyperintensity previously seen in the splenium of the corpus callosum. No encephalomalacia.  Major intracranial vascular flow voids are preserved.  No midline shift, mass effect, evidence of mass lesion, ventriculomegaly, extra-axial collection or acute intracranial hemorrhage. Cervicomedullary junction and pituitary are within normal limits. Negative visualized cervical spine. Minimal for age nonspecific cerebral white matter T2 and FLAIR hyperintensity elsewhere is stable.  Normal bone marrow signal. Visualized orbit soft tissues are within normal limits. Visualized paranasal sinuses and mastoids are clear. Negative scalp soft tissues.  MRA HEAD FINDINGS  Antegrade flow in the posterior circulation. Normal right PICA origin. Codominant distal vertebral arteries. Patent vertebrobasilar junction. Normal AICA origins. No basilar stenosis. SCA and PCA origins within normal limits. Diminutive left posterior communicating artery, the right is not identified. Bilateral PCA  branches within normal limits.  Antegrade flow in both ICA siphons. Normal ophthalmic artery origins. Mild ICA irregularity. No stenosis. Normal carotid termini. Normal MCA and ACA origins. Normal visualized bilateral MCA branches.  Tortuous proximal ACA branches, more so the left A1 segment. The anterior communicating artery is mildly ectatic and there is a median artery of the corpus callosum, but no ACA aneurysm is identified. No ACA stenosis, and otherwise negative visualized ACA branches.  MRA NECK FINDINGS  Time-of-flight images demonstrate antegrade flow in both carotid and vertebral arteries in the neck. Carotid bifurcations appear within normal limits. No evidence of hemodynamically significant stenosis in the neck.  IMPRESSION: MRI HEAD IMPRESSION  No acute intracranial abnormality and normal for age non contrast MRI appearance of the brain. No residual signal abnormality in the splenium where diffusion with abnormal on the previous MRI.  MRA HEAD IMPRESSION  Negative intracranial MRA; mildly tortuous proximal ACA branches.  MRA NECK IMPRESSION  Negative non contrast neck MRA.   Electronically Signed   By: Augusto Gamble M.D.   On: 10/18/2012 11:40    Medications: Scheduled Meds: . atorvastatin  10 mg Oral q1800  . chlorthalidone  25 mg Oral Daily  . clopidogrel  75 mg Oral Q breakfast  . ezetimibe  10 mg Oral Daily  . feeding supplement  237 mL Oral BID BM  . hydrALAZINE  50 mg Oral TID  . influenza vac split quadrivalent PF  0.5 mL Intramuscular Tomorrow-1000  . insulin aspart  0-9 Units Subcutaneous TID WC  . pantoprazole  40 mg Oral Daily  . sodium chloride  3 mL Intravenous Q12H  . sodium chloride  3 mL Intravenous Q12H   Continuous Infusions:  PRN Meds:.sodium chloride, hydrALAZINE, HYDROcodone-acetaminophen, ondansetron (ZOFRAN) IV, ondansetron, sodium chloride    LOS: 2 days  Tiny Chaudhary,CHRISTOPHER  [facility] Pager [phone]. If 8PM-8AM, please contact night-coverage at  [email or web address], password [facility] 10/19/2012, 10:42 AM  LOS: 2 days

## 2012-10-19 NOTE — Progress Notes (Signed)
Utilization Review completed.  

## 2012-10-19 NOTE — Progress Notes (Signed)
Subjective: Patient reports that symptoms have resolved but just does not feel well-no specifics.  They do recur intermittently for short periods of time.  Remains on Plavix.  Objective: Current vital signs: BP 117/82  Pulse 65  Temp(Src) 98.2 F (36.8 C) (Oral)  Resp 20  Ht 5\' 6"  (1.676 m)  Wt 65.998 kg (145 lb 8 oz)  BMI 23.5 kg/m2  SpO2 96% Vital signs in last 24 hours: Temp:  [98 F (36.7 C)-98.4 F (36.9 C)] 98.2 F (36.8 C) (09/28 0649) Pulse Rate:  [56-65] 65 (09/28 0649) Resp:  [18-20] 20 (09/28 0649) BP: (83-132)/(51-82) 117/82 mmHg (09/28 0649) SpO2:  [96 %-100 %] 96 % (09/28 0649)  Intake/Output from previous day: 09/27 0701 - 09/28 0700 In: 240 [P.O.:240] Out: -  Intake/Output this shift:   Nutritional status: Carb Control  Neurologic Exam: Mental Status:  Alert, awake, oriented. Speech fluent without evidence of aphasia. Able to follow 3 step commands without difficulty.  Cranial Nerves:  II: Discs flat bilaterally; Visual fields grossly normal, pupils equal, round, reactive to light and accommodation  III,IV, VI: ptosis not present, extra-ocular motions intact bilaterally  V,VII: smile symmetric, facial light touch sensation normal bilaterally  VIII: hearing normal bilaterally  IX,X: gag reflex present  XI: bilateral shoulder shrug  XII: midline tongue extension  Motor:  5/5 throughout  Sensory: Pinprick and light touch intact throughout, bilaterally  Deep Tendon Reflexes:  2+ throughout  Plantars:  Right: downgoing   Left: downgoing  Cerebellar:  normal finger-to-nose, normal heel-to-shin test   Lab Results: Basic Metabolic Panel:  Recent Labs Lab 10/17/12 2258 10/18/12 0131 10/19/12 0513  NA 141  --  140  K 3.0*  --  3.8  CL 98  --  102  CO2 25  --  29  GLUCOSE 145*  --  116*  BUN 8  --  10  CREATININE 0.92  --  1.06  CALCIUM 9.2  --  8.4  MG  --  1.6  --     Liver Function Tests: No results found for this basename: AST, ALT,  ALKPHOS, BILITOT, PROT, ALBUMIN,  in the last 168 hours No results found for this basename: LIPASE, AMYLASE,  in the last 168 hours No results found for this basename: AMMONIA,  in the last 168 hours  CBC:  Recent Labs Lab 10/17/12 2258  WBC 11.4*  NEUTROABS 9.3*  HGB 12.4  HCT 36.2  MCV 90.7  PLT 229    Cardiac Enzymes:  Recent Labs Lab 10/17/12 2315  TROPONINI <0.30    Lipid Panel: No results found for this basename: CHOL, TRIG, HDL, CHOLHDL, VLDL, LDLCALC,  in the last 168 hours  CBG:  Recent Labs Lab 10/18/12 1224 10/18/12 1634 10/18/12 2117 10/19/12 0648  GLUCAP 185* 92 142* 103*    Microbiology: Results for orders placed during the hospital encounter of 11/17/11  URINE CULTURE     Status: None   Collection Time    11/17/11  1:44 PM      Result Value Range Status   Specimen Description URINE, CLEAN CATCH   Final   Special Requests Normal   Final   Culture  Setup Time 11/17/2011 21:10   Final   Colony Count 6,000 COLONIES/ML   Final   Culture INSIGNIFICANT GROWTH   Final   Report Status 11/18/2011 FINAL   Final  URINE CULTURE     Status: None   Collection Time    11/17/11  3:33 PM  Result Value Range Status   Specimen Description URINE, RANDOM   Final   Special Requests NONE   Final   Culture  Setup Time 11/17/2011 21:09   Final   Colony Count 5,000 COLONIES/ML   Final   Culture INSIGNIFICANT GROWTH   Final   Report Status 11/18/2011 FINAL   Final    Coagulation Studies: No results found for this basename: LABPROT, INR,  in the last 72 hours  Imaging: Dg Chest 2 View  10/17/2012   *RADIOLOGY REPORT*  Clinical Data: Palpitations.  CHEST - 2 VIEW  Comparison: Chest radiograph July 28, 2012.  Findings: Cardiomediastinal silhouette is unremarkable and unchanged.  Mild chronic interstitial changes without pleural effusions or focal consolidations.  Pulmonary vasculature is unremarkable.  Trachea projects midline and there is no pneumothorax.  The  soft tissue planes and included osseous structures are not suspicious.  IMPRESSION: No acute cardiopulmonary process; stable appearance of the chest from July 28, 2012.   Original Report Authenticated By: Awilda Metro   Ct Head Wo Contrast  10/18/2012   *RADIOLOGY REPORT*  Clinical Data: Palpitations, tinnitus.  CT HEAD WITHOUT CONTRAST  Technique:  Contiguous axial images were obtained from the base of the skull through the vertex without contrast.  Comparison: MRI of the brain Jun 20, 2012.  Findings: The ventricles and sulci are normal for patient's age. No intraparenchymal hemorrhage, mass effect, midline shift to the or abnormal focal hypodensities.  No acute large vascular territory infarcts.  No abnormal extra-axial fluid collections.  Basal cisterns are patent. Visualized paranasal sinuses and mastoid air cells appear well aerated.  No skull fracture.  Included ocular globes and orbital contents are not suspicious but not tailored for evaluation.  Soft tissue within the bilateral external auditory canals likely reflective of cerumen.  IMPRESSION: No acute intracranial process; normal noncontrast CT of the head for age.   Original Report Authenticated By: Awilda Metro   Mr [facility] Wo Contrast  10/18/2012   CLINICAL DATA:  67 year old female with history of hypertension and previous corpus callosum infarct. Headache, left side numbness and tingling, heart palpations.  EXAM: MRI HEAD WITHOUT CONTRAST AND MRA HEAD WITHOUT CONTRAST AND MRA NECK WITHOUT CONTRAST  TECHNIQUE: Multiplanar, multiecho pulse sequences of the brain and surrounding structures were obtained without intravenous contrast. Angiographic images of the head were obtained using MRA technique without contrast. Multiplanar, multiecho pulse sequences of the neck and surrounding structures were obtained without intravenous contrast.  COMPARISON:  Head CT without contrast 10/17/2012. Brain MRI and MRA 06/20/2012.  FINDINGS: MRI HEAD  FINDINGS  No restricted diffusion or evidence of acute infarction. Interval resolved subtle T2 hyperintensity previously seen in the splenium of the corpus callosum. No encephalomalacia.  Major intracranial vascular flow voids are preserved.  No midline shift, mass effect, evidence of mass lesion, ventriculomegaly, extra-axial collection or acute intracranial hemorrhage. Cervicomedullary junction and pituitary are within normal limits. Negative visualized cervical spine. Minimal for age nonspecific cerebral white matter T2 and FLAIR hyperintensity elsewhere is stable.  Normal bone marrow signal. Visualized orbit soft tissues are within normal limits. Visualized paranasal sinuses and mastoids are clear. Negative scalp soft tissues.  MRA HEAD FINDINGS  Antegrade flow in the posterior circulation. Normal right PICA origin. Codominant distal vertebral arteries. Patent vertebrobasilar junction. Normal AICA origins. No basilar stenosis. SCA and PCA origins within normal limits. Diminutive left posterior communicating artery, the right is not identified. Bilateral PCA branches within normal limits.  Antegrade flow in both ICA siphons.  Normal ophthalmic artery origins. Mild ICA irregularity. No stenosis. Normal carotid termini. Normal MCA and ACA origins. Normal visualized bilateral MCA branches.  Tortuous proximal ACA branches, more so the left A1 segment. The anterior communicating artery is mildly ectatic and there is a median artery of the corpus callosum, but no ACA aneurysm is identified. No ACA stenosis, and otherwise negative visualized ACA branches.  MRA NECK FINDINGS  Time-of-flight images demonstrate antegrade flow in both carotid and vertebral arteries in the neck. Carotid bifurcations appear within normal limits. No evidence of hemodynamically significant stenosis in the neck.  IMPRESSION: MRI HEAD IMPRESSION  No acute intracranial abnormality and normal for age non contrast MRI appearance of the brain. No  residual signal abnormality in the splenium where diffusion with abnormal on the previous MRI.  MRA HEAD IMPRESSION  Negative intracranial MRA; mildly tortuous proximal ACA branches.  MRA NECK IMPRESSION  Negative non contrast neck MRA.   Electronically Signed   By: Augusto Gamble M.D.   On: 10/18/2012 11:40   Mr Angiogram Neck Wo Contrast  10/18/2012   CLINICAL DATA:  67 year old female with history of hypertension and previous corpus callosum infarct. Headache, left side numbness and tingling, heart palpations.  EXAM: MRI HEAD WITHOUT CONTRAST AND MRA HEAD WITHOUT CONTRAST AND MRA NECK WITHOUT CONTRAST  TECHNIQUE: Multiplanar, multiecho pulse sequences of the brain and surrounding structures were obtained without intravenous contrast. Angiographic images of the head were obtained using MRA technique without contrast. Multiplanar, multiecho pulse sequences of the neck and surrounding structures were obtained without intravenous contrast.  COMPARISON:  Head CT without contrast 10/17/2012. Brain MRI and MRA 06/20/2012.  FINDINGS: MRI HEAD FINDINGS  No restricted diffusion or evidence of acute infarction. Interval resolved subtle T2 hyperintensity previously seen in the splenium of the corpus callosum. No encephalomalacia.  Major intracranial vascular flow voids are preserved.  No midline shift, mass effect, evidence of mass lesion, ventriculomegaly, extra-axial collection or acute intracranial hemorrhage. Cervicomedullary junction and pituitary are within normal limits. Negative visualized cervical spine. Minimal for age nonspecific cerebral white matter T2 and FLAIR hyperintensity elsewhere is stable.  Normal bone marrow signal. Visualized orbit soft tissues are within normal limits. Visualized paranasal sinuses and mastoids are clear. Negative scalp soft tissues.  MRA HEAD FINDINGS  Antegrade flow in the posterior circulation. Normal right PICA origin. Codominant distal vertebral arteries. Patent vertebrobasilar  junction. Normal AICA origins. No basilar stenosis. SCA and PCA origins within normal limits. Diminutive left posterior communicating artery, the right is not identified. Bilateral PCA branches within normal limits.  Antegrade flow in both ICA siphons. Normal ophthalmic artery origins. Mild ICA irregularity. No stenosis. Normal carotid termini. Normal MCA and ACA origins. Normal visualized bilateral MCA branches.  Tortuous proximal ACA branches, more so the left A1 segment. The anterior communicating artery is mildly ectatic and there is a median artery of the corpus callosum, but no ACA aneurysm is identified. No ACA stenosis, and otherwise negative visualized ACA branches.  MRA NECK FINDINGS  Time-of-flight images demonstrate antegrade flow in both carotid and vertebral arteries in the neck. Carotid bifurcations appear within normal limits. No evidence of hemodynamically significant stenosis in the neck.  IMPRESSION: MRI HEAD IMPRESSION  No acute intracranial abnormality and normal for age non contrast MRI appearance of the brain. No residual signal abnormality in the splenium where diffusion with abnormal on the previous MRI.  MRA HEAD IMPRESSION  Negative intracranial MRA; mildly tortuous proximal ACA branches.  MRA NECK IMPRESSION  Negative non  contrast neck MRA.   Electronically Signed   By: Augusto Gamble M.D.   On: 10/18/2012 11:40   Mr Brain Wo Contrast  10/18/2012   CLINICAL DATA:  67 year old female with history of hypertension and previous corpus callosum infarct. Headache, left side numbness and tingling, heart palpations.  EXAM: MRI HEAD WITHOUT CONTRAST AND MRA HEAD WITHOUT CONTRAST AND MRA NECK WITHOUT CONTRAST  TECHNIQUE: Multiplanar, multiecho pulse sequences of the brain and surrounding structures were obtained without intravenous contrast. Angiographic images of the head were obtained using MRA technique without contrast. Multiplanar, multiecho pulse sequences of the neck and surrounding structures  were obtained without intravenous contrast.  COMPARISON:  Head CT without contrast 10/17/2012. Brain MRI and MRA 06/20/2012.  FINDINGS: MRI HEAD FINDINGS  No restricted diffusion or evidence of acute infarction. Interval resolved subtle T2 hyperintensity previously seen in the splenium of the corpus callosum. No encephalomalacia.  Major intracranial vascular flow voids are preserved.  No midline shift, mass effect, evidence of mass lesion, ventriculomegaly, extra-axial collection or acute intracranial hemorrhage. Cervicomedullary junction and pituitary are within normal limits. Negative visualized cervical spine. Minimal for age nonspecific cerebral white matter T2 and FLAIR hyperintensity elsewhere is stable.  Normal bone marrow signal. Visualized orbit soft tissues are within normal limits. Visualized paranasal sinuses and mastoids are clear. Negative scalp soft tissues.  MRA HEAD FINDINGS  Antegrade flow in the posterior circulation. Normal right PICA origin. Codominant distal vertebral arteries. Patent vertebrobasilar junction. Normal AICA origins. No basilar stenosis. SCA and PCA origins within normal limits. Diminutive left posterior communicating artery, the right is not identified. Bilateral PCA branches within normal limits.  Antegrade flow in both ICA siphons. Normal ophthalmic artery origins. Mild ICA irregularity. No stenosis. Normal carotid termini. Normal MCA and ACA origins. Normal visualized bilateral MCA branches.  Tortuous proximal ACA branches, more so the left A1 segment. The anterior communicating artery is mildly ectatic and there is a median artery of the corpus callosum, but no ACA aneurysm is identified. No ACA stenosis, and otherwise negative visualized ACA branches.  MRA NECK FINDINGS  Time-of-flight images demonstrate antegrade flow in both carotid and vertebral arteries in the neck. Carotid bifurcations appear within normal limits. No evidence of hemodynamically significant stenosis in  the neck.  IMPRESSION: MRI HEAD IMPRESSION  No acute intracranial abnormality and normal for age non contrast MRI appearance of the brain. No residual signal abnormality in the splenium where diffusion with abnormal on the previous MRI.  MRA HEAD IMPRESSION  Negative intracranial MRA; mildly tortuous proximal ACA branches.  MRA NECK IMPRESSION  Negative non contrast neck MRA.   Electronically Signed   By: Augusto Gamble M.D.   On: 10/18/2012 11:40    Medications:  I have reviewed the patient's current medications. Scheduled: . atorvastatin  10 mg Oral q1800  . chlorthalidone  25 mg Oral Daily  . clopidogrel  75 mg Oral Q breakfast  . ezetimibe  10 mg Oral Daily  . feeding supplement  237 mL Oral BID BM  . hydrALAZINE  50 mg Oral TID  . influenza vac split quadrivalent PF  0.5 mL Intramuscular Tomorrow-1000  . insulin aspart  0-9 Units Subcutaneous TID WC  . pantoprazole  40 mg Oral Daily  . sodium chloride  3 mL Intravenous Q12H  . sodium chloride  3 mL Intravenous Q12H    Assessment/Plan: Patient currently asymptomatic.  MRI of the brain has been reviewed and shows no acute changes.  MRA reviewed and unremarkable.  Patient remains on Plavix.  With symptoms being intermittent and not including the face would rule out a cervical etiology as well.  DUr e to lack of symptoms this doe not need to be performed urgently.  Recommendations: 1.  MRI of the cervical spine 2.  Continue Plavix   LOS: 2 days   Thana Farr, MD [facility] [phone] 10/19/2012  9:52 AM

## 2012-10-20 DIAGNOSIS — R002 Palpitations: Secondary | ICD-10-CM | POA: Diagnosis not present

## 2012-10-20 DIAGNOSIS — K219 Gastro-esophageal reflux disease without esophagitis: Secondary | ICD-10-CM

## 2012-10-20 DIAGNOSIS — M4802 Spinal stenosis, cervical region: Secondary | ICD-10-CM | POA: Diagnosis present

## 2012-10-20 LAB — GLUCOSE, CAPILLARY
Glucose-Capillary: 114 mg/dL — ABNORMAL HIGH (ref 70–99)
Glucose-Capillary: 140 mg/dL — ABNORMAL HIGH (ref 70–99)

## 2012-10-20 LAB — BASIC METABOLIC PANEL
BUN: 10 mg/dL (ref 6–23)
CO2: 31 mEq/L (ref 19–32)
Calcium: 8.9 mg/dL (ref 8.4–10.5)
Chloride: 99 mEq/L (ref 96–112)
Creatinine, Ser: 0.99 mg/dL (ref 0.50–1.10)
GFR calc Af Amer: 68 mL/min — ABNORMAL LOW (ref >90)
GFR calc non Af Amer: 59 mL/min — ABNORMAL LOW (ref >90)
Glucose, Bld: 113 mg/dL — ABNORMAL HIGH (ref 70–99)
Potassium: 3.5 mEq/L (ref 3.5–5.1)
Sodium: 140 mEq/L (ref 135–145)

## 2012-10-20 MED ORDER — CLONIDINE HCL 0.2 MG PO TABS
0.2000 mg | ORAL_TABLET | Freq: Two times a day (BID) | ORAL | Status: DC
  Administered 2012-10-20: 0.2 mg via ORAL
  Filled 2012-10-20 (×2): qty 1

## 2012-10-20 MED ORDER — ENSURE COMPLETE PO LIQD: 237.0000 mL | Freq: Two times a day (BID) | ORAL | Status: DC

## 2012-10-20 MED ORDER — ZOLPIDEM TARTRATE 5 MG PO TABS: 5.0000 mg | ORAL_TABLET | Freq: Every day | ORAL | Status: DC

## 2012-10-20 MED ORDER — ZOLPIDEM TARTRATE 5 MG PO TABS: 5.0000 mg | ORAL_TABLET | Freq: Every evening | ORAL | Status: DC | PRN

## 2012-10-20 MED ORDER — LOSARTAN POTASSIUM 50 MG PO TABS
100.0000 mg | ORAL_TABLET | Freq: Every day | ORAL | Status: DC
  Administered 2012-10-20: 100 mg via ORAL
  Filled 2012-10-20: qty 2

## 2012-10-20 NOTE — Progress Notes (Signed)
[facility] PROGRESS NOTE  TAHJAE DURR WUJ:[id number] DOB: April 13, 1945 DOA: 10/17/2012 PCP: No PCP Per Patient  Assessment/Plan: Principal Problem:   Left sided numbness Active Problems:   HYPERTENSION   LOW BACK PAIN, CHRONIC   Diabetes mellitus   Prolonged QT interval   Palpitations    1. Left-sided paresthesiae: Patient presented with sensory symptoms, associated with palpitations. Physical examination revealed no focal neurologic deficit. Head CT scan was devoid of acute findings, and 12-Lead EKG showed SR. Dr Wyatt Portela provided neurology consultation, and recommended continue pre-admission ASA/Plavix. Brain MRI/MRA, as well as neck MRA were all negative for acute or concerning findings. Symptoms have resolved as of AM of 10/18/12.  2. Cervical spinal stenosis/Myelopathy: Cervical spine MRI done per neurology recommendations, revealed multifactorial mild to moderate degenerative cervical spinal stenosis from C3-C4 to C6-C7 , most pronounced at the middle 2 levels. Spinal cord mass effect but no spinal cord signal abnormality. Also, multifactorial moderate or severe neural foraminal stenosis at the bilateral C5 and left C6 nerve levels. Have consulted Dr Barnett Abu, neurosurgeon. Awaiting recommendations. 3. Palpitations: No arrhythmias recorded on telemetric monitoring so far. No previous history of palpitations. May have been due to a paroxysmal tachyarrhythmia. Will continue telemetric monitoring. If recurs, may need event monitor. TSH is normal at 1.413. 4. Prolonged QT interval: This was an incidental finding on 12-Lead EKG. Pre-admission Trazodone may be a possible culprit. This has been discontinued. Patient has a mild hypokalemia, which was corrected. Repeat EKG of 10/19/12, showed normalized QT interval 5. Diabetes mellitus: Patient has known type-2 DM, which appears reasonably controlled, based on random blood glucose of 145. HBA1C is 6.5. Managing with SSI for now.  Patient will be discharged on pre-admission oral hypoglycemics. 6. HTN: On pre-admission antihypertensives, BP dropped to 83/67 on 10/18/12 night and antihypertensives were held. Re-introduced with exception of Cardizem CD today, as BP is up once again.  7. Hypokalemia: Likely due to diuretics. Repleting as indicated. May have contributed to symptoms.    Code Status: Full Code.  Family Communication:  Disposition Plan: To be determined. Per neurosurgeon.    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 Symptoms similar to when she had her tia earlier in the year. Patient is on Plavix/ASA per report. No slurred speech, no weakness. No chest pain. No SOB. No fevers. Does have a headache and feels like her heart is pounding in her head. Having palpitations that awoke her at night. No vision changes. Admitted for further evaluation and management.   Consultants:  Dr Wyatt Portela, neurologist.   Procedures:  CXR.  Head CT scan.  Brain MRI/MRA.  Neck MRA.   Antibiotics:  N/A.   HPI/Subjective: No new issues.  Objective: Vital signs in last 24 hours: Temp:  [98 F (36.7 C)-98.9 F (37.2 C)] 98.9 F (37.2 C) (09/29 0957) Pulse Rate:  [65-76] 76 (09/29 0957) Resp:  [20-22] 20 (09/29 0957) BP: (115-180)/(58-76) 169/76 mmHg (09/29 0957) SpO2:  [94 %-100 %] 100 % (09/29 0957) Weight change:     Intake/Output from previous day: 09/28 0701 - 09/29 0700 In: -  Out: 2 [Urine:1; Stool:1]     Physical Exam: General: Comfortable, alert, communicative, fully oriented, not short of breath at rest.  HEENT:  No clinical pallor, no jaundice, no conjunctival injection or discharge. Hydration is satisfactory.  NECK:  Supple, JVP not seen, no carotid bruits, no palpable lymphadenopathy,  no palpable goiter. CHEST:  Clinically clear to auscultation, no wheezes, no crackles. HEART:  Sounds 1 and 2  heard, normal, regular, no murmurs. ABDOMEN:  Full, soft, non-tender, no palpable organomegaly, no palpable masses, normal bowel sounds. GENITALIA:  Not examined. LOWER EXTREMITIES:  No pitting edema, palpable peripheral pulses. MUSCULOSKELETAL SYSTEM:  Generalized osteoarthritic changes, otherwise, normal. CENTRAL NERVOUS SYSTEM:  No focal neurologic deficit on gross examination.  Lab Results:  Recent Labs  10/17/12 2258  WBC 11.4*  HGB 12.4  HCT 36.2  PLT 229    Recent Labs  10/19/12 0513 10/20/12 0605  NA 140 140  K 3.8 3.5  CL 102 99  CO2 29 31  GLUCOSE 116* 113*  BUN 10 10  CREATININE 1.06 0.99  CALCIUM 8.4 8.9   No results found for this or any previous visit (from the past 240 hour(s)).   Studies/Results: Mr Cervical Spine W Wo Contrast  10/19/2012   CLINICAL DATA:  67 year old female with left-sided paresthesia. Cervical myelopathy. Initial encounter.  EXAM: MRI CERVICAL SPINE WITHOUT AND WITH CONTRAST  TECHNIQUE: Multiplanar and multiecho pulse sequences of the cervical spine, to include the craniocervical junction and cervicothoracic junction, were obtained according to standard protocol without and with intravenous contrast.  CONTRAST:  14mL MULTIHANCE GADOBENATE DIMEGLUMINE 529 MG/ML IV SOLN  COMPARISON:  Cervical spine radiographs 03/08/2006. Brain MRI 10/18/2012.  FINDINGS: Cervicomedullary junction is within normal limits. Normal bone marrow signal at the skullbase. Widespread degenerative endplate marrow changes in the cervical spine. Loss of cervical lordosis since 2008. No definite marrow edema or acute osseous abnormality.  Despite spinal stenosis (see below) , Spinal cord signal is within normal limits at all visualized levels.  9 mm mildly T2 hyperintense left thyroid nodule, too small to characterize most likely benign and significance doubtful in this age group. Partially retropharyngeal course of the right carotid artery. Other Visualized paraspinal soft  tissues are within normal limits. No abnormal enhancement identified.  C2-C3: Negative.  C3-C4: Circumferential disc osteophyte complex. Broad-based posterior component of disc. Mild ligament flavum hypertrophy. Spinal stenosis with mild spinal cord flattening. Borderline to mild left C4 foraminal stenosis.  C4-C5: Circumferential disc osteophyte complex. Mild ligament flavum hypertrophy. Broad-based posterior component of disc. Mild to moderate spinal stenosis with spinal cord flattening. Left greater than right uncovertebral hypertrophy. Moderate bilateral C5 foraminal stenosis.  C5-C6: Circumferential disc osteophyte complex eccentric to the left. Slightly lobulated posterior component of disc. Mild to moderate spinal stenosis with spinal cord flattening. Left greater than right uncovertebral hypertrophy. Severe left and mild right C6 foraminal stenosis.  C6-C7: Mild circumferential disc bulge. Superimposed right paracentral disc protrusion (series 5, image 22). Spinal stenosis with mild spinal cord flattening. Mild uncovertebral hypertrophy. Mild left greater than right C7 foraminal stenosis.  C7-T1: Mild facet and uncovertebral hypertrophy. No significant stenosis.  No upper thoracic spinal stenosis.  IMPRESSION: 1. Multifactorial mild to moderate degenerative cervical spinal stenosis from C3-C4 to C6-C7 , most pronounced at the middle 2 levels. Spinal cord mass effect but no spinal cord signal abnormality.  2. Multifactorial moderate or severe neural foraminal stenosis at the bilateral C5 and left C6 nerve levels.   Electronically Signed   By: Augusto Gamble M.D.   On: 10/19/2012 16:12    Medications: Scheduled Meds: . atorvastatin  10 mg Oral q1800  . cloNIDine  0.2 mg Oral BID  . clopidogrel  75 mg Oral Q breakfast  . ezetimibe  10 mg Oral Daily  . feeding supplement  237 mL Oral BID BM  . hydrALAZINE  50 mg Oral TID  . insulin aspart  0-9 Units Subcutaneous TID WC  . losartan  100 mg Oral Daily  .  pantoprazole  40 mg Oral Daily  . sodium chloride  3 mL Intravenous Q12H  . sodium chloride  3 mL Intravenous Q12H   Continuous Infusions:  PRN Meds:.sodium chloride, hydrALAZINE, HYDROcodone-acetaminophen, ondansetron (ZOFRAN) IV, ondansetron, sodium chloride    LOS: 3 days   Belenda Alviar,CHRISTOPHER  [facility] Pager [phone]. If 8PM-8AM, please contact night-coverage at [email or web address], password [facility] 10/20/2012, 11:17 AM  LOS: 3 days

## 2012-10-20 NOTE — Consult Note (Signed)
Reason for Consult: Cervical spondylosis Referring Physician: Ricke Hey M.D.  [name] is an 67 y.o. female.  HPI: [name] is a 67 year old right-handed individual who tells me that she is feeling fine. As requested by Dr. 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 The patient was hospitalized for generalized weakness. It is believed that she had an additional stroke. Her brain studies apparently are normal however she demonstrates findings on the cervical MRI that demonstrates cord compression at C4-5 C5-6 and to a lesser extent at C6-C7 C3-4 is also involved with moderate spondylosis.  The patient indicates on one hand she is feeling quite well she doesn't need any intervention. She is certainly not interested in any surgery. I noted that if she is feeling well and she is moving about well then no specific followup is necessary and she may be discharged at this point she states that she doesn't feel well and she needs to stay in hospital because she hasn't slept in 3 days and she's not being helped.  Past Medical History  Diagnosis Date  . Diabetes mellitus without complication   . Hypertension   . Stroke     Past Surgical History  Procedure Laterality Date  . Abdominal hysterectomy      History reviewed. No pertinent family history.  Social History:  reports that she has never smoked. She does not have any smokeless tobacco history on file. She reports that she does not drink alcohol or use illicit drugs.  Allergies:  Allergies  Allergen Reactions  . Codeine     REACTION: Hallucinations  . Ibuprofen     REACTION: Elevated Blood Pressure    Medications: I have reviewed the patient's current medications.  Results for orders placed during the hospital encounter of 10/17/12 (from the past 48 hour(s))  GLUCOSE, CAPILLARY     Status: Abnormal   Collection Time    10/18/12 12:24 PM       Result Value Range   Glucose-Capillary 185 (*) 70 - 99 mg/dL   Comment 1 Notify RN     Comment 2 Documented in Chart    HEMOGLOBIN A1C     Status: Abnormal   Collection Time    10/18/12  1:00 PM      Result Value Range   Hemoglobin A1C 6.5 (*) <5.7 %   Comment: (NOTE)                                                                               According to the ADA Clinical Practice Recommendations for 2011, when     HbA1c is used as a screening test:      >=6.5%   Diagnostic of Diabetes Mellitus               (if abnormal result is confirmed)     5.7-6.4%   Increased risk of developing Diabetes Mellitus     References:Diagnosis and Classification of Diabetes Mellitus,Diabetes     Care,2011,34(Suppl 1):S62-S69 and Standards of Medical Care in             Diabetes - 2011,Diabetes  ZOXW,9604,54 (Suppl 1):S11-S61.   Mean Plasma Glucose 140 (*) <117 mg/dL   Comment: Performed at [facility]  TSH     Status: None   Collection Time    10/18/12  1:00 PM      Result Value Range   TSH 1.413  0.350 - 4.500 uIU/mL   Comment: Performed at [facility]  GLUCOSE, CAPILLARY     Status: None   Collection Time    10/18/12  4:34 PM      Result Value Range   Glucose-Capillary 92  70 - 99 mg/dL  GLUCOSE, CAPILLARY     Status: Abnormal   Collection Time    10/18/12  9:17 PM      Result Value Range   Glucose-Capillary 142 (*) 70 - 99 mg/dL   Comment 1 Documented in Chart     Comment 2 Notify RN    BASIC METABOLIC PANEL     Status: Abnormal   Collection Time    10/19/12  5:13 AM      Result Value Range   Sodium 140  135 - 145 mEq/L   Potassium 3.8  3.5 - 5.1 mEq/L   Comment: DELTA CHECK NOTED   Chloride 102  96 - 112 mEq/L   CO2 29  19 - 32 mEq/L   Glucose, Bld 116 (*) 70 - 99 mg/dL   BUN 10  6 - 23 mg/dL   Creatinine, Ser 0.98  0.50 - 1.10 mg/dL   Calcium 8.4  8.4 - 11.9 mg/dL   GFR calc non Af Amer 54 (*) >90 mL/min   GFR calc Af Amer 62 (*) >90 mL/min   Comment:  (NOTE)     The eGFR has been calculated using the CKD EPI equation.     This calculation has not been validated in all clinical situations.     eGFR's persistently <90 mL/min signify possible Chronic Kidney     Disease.  GLUCOSE, CAPILLARY     Status: Abnormal   Collection Time    10/19/12  6:48 AM      Result Value Range   Glucose-Capillary 103 (*) 70 - 99 mg/dL   Comment 1 Notify RN     Comment 2 Documented in Chart    GLUCOSE, CAPILLARY     Status: Abnormal   Collection Time    10/19/12 12:14 PM      Result Value Range   Glucose-Capillary 169 (*) 70 - 99 mg/dL  GLUCOSE, CAPILLARY     Status: Abnormal   Collection Time    10/19/12  4:36 PM      Result Value Range   Glucose-Capillary 116 (*) 70 - 99 mg/dL  GLUCOSE, CAPILLARY     Status: Abnormal   Collection Time    10/19/12 10:01 PM      Result Value Range   Glucose-Capillary 131 (*) 70 - 99 mg/dL   Comment 1 Documented in Chart     Comment 2 Notify RN    BASIC METABOLIC PANEL     Status: Abnormal   Collection Time    10/20/12  6:05 AM      Result Value Range   Sodium 140  135 - 145 mEq/L   Potassium 3.5  3.5 - 5.1 mEq/L   Chloride 99  96 - 112 mEq/L   CO2 31  19 - 32 mEq/L   Glucose, Bld 113 (*) 70 - 99 mg/dL   BUN 10  6 - 23 mg/dL  Creatinine, Ser 0.99  0.50 - 1.10 mg/dL   Calcium 8.9  8.4 - 40.9 mg/dL   GFR calc non Af Amer 59 (*) >90 mL/min   GFR calc Af Amer 68 (*) >90 mL/min   Comment: (NOTE)     The eGFR has been calculated using the CKD EPI equation.     This calculation has not been validated in all clinical situations.     eGFR's persistently <90 mL/min signify possible Chronic Kidney     Disease.  GLUCOSE, CAPILLARY     Status: Abnormal   Collection Time    10/20/12 11:47 AM      Result Value Range   Glucose-Capillary 140 (*) 70 - 99 mg/dL    Mr Cervical Spine W Wo Contrast  10/19/2012   CLINICAL DATA:  67 year old female with left-sided paresthesia. Cervical myelopathy. Initial encounter.  EXAM:  MRI CERVICAL SPINE WITHOUT AND WITH CONTRAST  TECHNIQUE: Multiplanar and multiecho pulse sequences of the cervical spine, to include the craniocervical junction and cervicothoracic junction, were obtained according to standard protocol without and with intravenous contrast.  CONTRAST:  14mL MULTIHANCE GADOBENATE DIMEGLUMINE 529 MG/ML IV SOLN  COMPARISON:  Cervical spine radiographs 03/08/2006. Brain MRI 10/18/2012.  FINDINGS: Cervicomedullary junction is within normal limits. Normal bone marrow signal at the skullbase. Widespread degenerative endplate marrow changes in the cervical spine. Loss of cervical lordosis since 2008. No definite marrow edema or acute osseous abnormality.  Despite spinal stenosis (see below) , Spinal cord signal is within normal limits at all visualized levels.  9 mm mildly T2 hyperintense left thyroid nodule, too small to characterize most likely benign and significance doubtful in this age group. Partially retropharyngeal course of the right carotid artery. Other Visualized paraspinal soft tissues are within normal limits. No abnormal enhancement identified.  C2-C3: Negative.  C3-C4: Circumferential disc osteophyte complex. Broad-based posterior component of disc. Mild ligament flavum hypertrophy. Spinal stenosis with mild spinal cord flattening. Borderline to mild left C4 foraminal stenosis.  C4-C5: Circumferential disc osteophyte complex. Mild ligament flavum hypertrophy. Broad-based posterior component of disc. Mild to moderate spinal stenosis with spinal cord flattening. Left greater than right uncovertebral hypertrophy. Moderate bilateral C5 foraminal stenosis.  C5-C6: Circumferential disc osteophyte complex eccentric to the left. Slightly lobulated posterior component of disc. Mild to moderate spinal stenosis with spinal cord flattening. Left greater than right uncovertebral hypertrophy. Severe left and mild right C6 foraminal stenosis.  C6-C7: Mild circumferential disc bulge.  Superimposed right paracentral disc protrusion (series 5, image 22). Spinal stenosis with mild spinal cord flattening. Mild uncovertebral hypertrophy. Mild left greater than right C7 foraminal stenosis.  C7-T1: Mild facet and uncovertebral hypertrophy. No significant stenosis.  No upper thoracic spinal stenosis.  IMPRESSION: 1. Multifactorial mild to moderate degenerative cervical spinal stenosis from C3-C4 to C6-C7 , most pronounced at the middle 2 levels. Spinal cord mass effect but no spinal cord signal abnormality.  2. Multifactorial moderate or severe neural foraminal stenosis at the bilateral C5 and left C6 nerve levels.   Electronically Signed   By: Augusto Gamble M.D.   On: 10/19/2012 16:12    Review of Systems  HENT: Negative.   Eyes: Negative.   Respiratory: Negative.   Cardiovascular: Negative.   Gastrointestinal: Negative.   Genitourinary: Negative.   Musculoskeletal: Negative.   Skin: Negative.   Neurological: Positive for dizziness and tingling.  Endo/Heme/Allergies: Negative.   Psychiatric/Behavioral: Negative.    Blood pressure 169/76, pulse 76, temperature 98.9 F (37.2 C), temperature source Oral,  resp. rate 20, height 5\' 6"  (1.676 m), weight 65.998 kg (145 lb 8 oz), SpO2 100.00%. Physical Exam  Constitutional: She is oriented to person, place, and time. She appears well-developed and well-nourished.  HENT:  Head: Normocephalic and atraumatic.  Eyes: Conjunctivae and EOM are normal. Pupils are equal, round, and reactive to light.  Neck: Normal range of motion. Neck supple.  Cardiovascular: Normal rate, regular rhythm and normal heart sounds.   Respiratory: Effort normal and breath sounds normal.  GI: Soft.  Musculoskeletal: Normal range of motion.  Neurological: She is alert and oriented to person, place, and time.  Absent biceps and triceps reflex bilaterally in the upper extremities trace patellar reflexes absent Achilles reflexes Babinski reflexes equivocal  bilaterally.  Renal nerve examination is normal.  Motor function is good in the upper extremities in deltoids biceps triceps grips and intrinsics.  Rapid alternating movements finger to nose finger is intact.  Skin: Skin is warm and dry.  Psychiatric: She has a normal mood and affect. Her behavior is normal.  Question the patient's judgment and thought content. Though on one hand she tells me that she is doing well and she doesn't need anything further done she then immediately suggests that she is not ready and can't go home because she is not feeling well when confronted with the thought of being discharged.    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 Exam at this time essentially neurologically intact in terms of motor strength and function. Certainly any surgical intervention is optional depending on the patient's functional status however am not certain that she has good insight into what may be occurring in her cervical spine. I'll be available to follow this patient up as an outpatient. She certainly is not interested in surgery at the current time and am not certain how real her difficulties that she describes are as her presentation seems to be very variable throughout the course of our conversation.  Demondre Aguas J 10/20/2012, 12:22 PM

## 2012-10-20 NOTE — Discharge Summary (Addendum)
Physician Discharge Summary  [name] VHQ:[id number] DOB: 11/08/1945 DOA: 10/17/2012  PCP: No PCP Per Patient  Admit date: 10/17/2012 Discharge date: 10/20/2012  Time spent: 40 minutes  Recommendations for Outpatient Follow-up:  1. Follow up with primary MD. 2. Follow up with Dr Barnett Abu, neurosurgeon.   Discharge Diagnoses:  Principal Problem:   Left sided numbness Active Problems:   HYPERTENSION   LOW BACK PAIN, CHRONIC   Diabetes mellitus   Prolonged QT interval   Palpitations   Spinal stenosis of cervical region   Discharge Condition: Satisfactory.   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 Symptoms similar to when she had her tia earlier in the year. Patient is on Plavix/ASA per report. No slurred speech, no weakness. No chest pain. No SOB. No fevers. Does have a headache and feels like her heart is pounding in her head. Having palpitations that awoke her at night. No vision changes. Admitted for further evaluation and management.   Hospital Course:  1. Left-sided paresthesiae: Patient presented with sensory symptoms, associated with palpitations. Physical examination revealed no focal neurologic deficit. Head CT scan was devoid of acute findings, and 12-Lead EKG showed SR. Dr Wyatt Portela provided neurology consultation, and recommended continue pre-admission ASA/Plavix. Brain MRI/MRA, as well as neck MRA were all negative for acute or concerning findings. Symptoms had resolved as of AM of 10/18/12. No recurrence during rest of hospitalization. 2. Cervical spinal stenosis/Myelopathy:  Cervical spine MRI done per neurology recommendations, revealed multifactorial mild to moderate degenerative cervical  spinal stenosis from C3-C4 to C6-C7 , most pronounced at the middle 2 levels. Spinal cord mass effect but no spinal cord signal abnormality. Also, multifactorial moderate or severe neural foraminal stenosis at the bilateral C5 and left C6 nerve levels. Dr Barnett Abu provided neurosurgical consultation, and concurs that the spinal findings merit attention. Patient however, is very reluctant to consider possible surgical intervention at this time, but has agreed to follow up with Dr Danielle Dess, on discharge. 3. Palpitations: No arrhythmias were recorded on telemetric monitoring during patient's hospitalization. No previous history of palpitations. May have been due to a paroxysmal tachyarrhythmia. TSH is normal at 1.413. Pre-admission beta-blocker was continued. 4. Prolonged QT interval: This was an incidental finding on 12-Lead EKG. Pre-admission Trazodone may be a possible culprit. This was discontinued, and a mild hypokalemia, corrected. Repeat EKG of 10/19/12, showed normalized QT interval.  5. Diabetes mellitus: Patient has known type-2 DM, which appears reasonably controlled, based on random blood glucose of 145. HBA1C is 6.5. Managing with SSI during hospitalization. Patient was discharged on pre-admission oral hypoglycemics.  6. HTN: On pre-admission antihypertensives, and remained stable. However, as BP dropped to 83/67 in the night of 10/08/12, antihypertensives were briefly held, and were reinstated with the exception of Cardizem CD on 10/20/12, without deleterious effect.  7. Hypokalemia: Likely due to diuretics. Repleted as indicated. May have contributed to symptoms.    Procedures:  See Below.   Consultations:  Dr Wyatt Portela, neurologist.   Discharge Exam: Filed Vitals:   10/20/12 0957  BP: 169/76  Pulse: 76  Temp: 98.9 F (37.2 C)  Resp: 20    General: Comfortable, alert, communicative, fully oriented, not short of breath at rest.  HEENT:  No clinical pallor, no jaundice, no  conjunctival injection or discharge. Hydration is satisfactory.  NECK: Supple, JVP not seen, no carotid bruits, no palpable lymphadenopathy, no palpable goiter.  CHEST: Clinically clear to auscultation, no wheezes, no crackles.  HEART: Sounds 1 and 2 heard, normal, regular, no murmurs.  ABDOMEN: Full, soft, non-tender, no palpable organomegaly, no palpable masses, normal bowel sounds.  GENITALIA: Not examined.  LOWER EXTREMITIES: No pitting edema, palpable peripheral pulses.  MUSCULOSKELETAL SYSTEM: Generalized osteoarthritic changes, otherwise, normal.  CENTRAL NERVOUS SYSTEM: No focal neurologic deficit on gross examination.  Discharge Instructions      Discharge Orders   Future Orders Complete By Expires   Diet - low sodium heart healthy  As directed    Diet Carb Modified  As directed    Increase activity slowly  As directed        Medication List    STOP taking these medications       diltiazem 360 MG 24 hr capsule  Commonly known as:  CARDIZEM CD     traZODone 50 MG tablet  Commonly known as:  DESYREL      TAKE these medications       atenolol-chlorthalidone 50-25 MG per tablet  Commonly known as:  TENORETIC  Take 1 tablet by mouth daily.     atorvastatin 10 MG tablet  Commonly known as:  LIPITOR  Take 1 tablet (10 mg total) by mouth daily at 6 PM.     cloNIDine 0.2 MG tablet  Commonly known as:  CATAPRES  Take 0.2 mg by mouth 2 (two) times daily.     clopidogrel 75 MG tablet  Commonly known as:  PLAVIX  Take 1 tablet (75 mg total) by mouth daily with breakfast.     dicyclomine 20 MG tablet  Commonly known as:  BENTYL  Take 1 tablet (20 mg total) by mouth 2 (two) times daily.     ezetimibe 10 MG tablet  Commonly known as:  ZETIA  Take 1 tablet (10 mg total) by mouth daily.     famotidine 20 MG tablet  Commonly known as:  PEPCID  Take 20 mg by mouth daily.     feeding supplement Liqd  Take 237 mLs by mouth 2 (two) times daily between meals.      glipiZIDE-metformin 5-500 MG per tablet  Commonly known as:  METAGLIP  Take 1-2 tablets by mouth 2 (two) times daily before a meal. Takes 2 tablets in the morning and 1 tablet at bedtime     hydrALAZINE 50 MG tablet  Commonly known as:  APRESOLINE  Take 50 mg by mouth 3 (three) times daily.     losartan 100 MG tablet  Commonly known as:  COZAAR  Take 1 tablet (100 mg total) by mouth daily.     pantoprazole 40 MG tablet  Commonly known as:  PROTONIX  Take 1 tablet (40 mg total) by mouth daily.     simethicone 80 MG chewable tablet  Commonly known as:  MYLICON  Chew 80 mg by mouth every 4 (four) hours as needed for flatulence.     traMADol 50 MG tablet  Commonly known as:  ULTRAM  Take 50 mg by mouth every 6 (six) hours as needed for pain.     zolpidem 5 MG tablet  Commonly known as:  AMBIEN  Take 1 tablet (5 mg total) by mouth at bedtime.       Allergies  Allergen Reactions  . Codeine  REACTION: Hallucinations  . Ibuprofen     REACTION: Elevated Blood Pressure   Follow-up Information   Please follow up. (Folow up with your primary MD. )       Follow up with Stefani Dama, MD.   Specialty:  Neurosurgery   Contact information:   [address] [place] [address] [phone]        The results of significant diagnostics from this hospitalization (including imaging, microbiology, ancillary and laboratory) are listed below for reference.    Significant Diagnostic Studies: Dg Chest 2 View  10/17/2012   *RADIOLOGY REPORT*  Clinical Data: Palpitations.  CHEST - 2 VIEW  Comparison: Chest radiograph July 28, 2012.  Findings: Cardiomediastinal silhouette is unremarkable and unchanged.  Mild chronic interstitial changes without pleural effusions or focal consolidations.  Pulmonary vasculature is unremarkable.  Trachea projects midline and there is no pneumothorax.  The soft tissue planes and included osseous structures are not suspicious.  IMPRESSION:  No acute cardiopulmonary process; stable appearance of the chest from July 28, 2012.   Original Report Authenticated By: Awilda Metro   Ct Head Wo Contrast  10/18/2012   *RADIOLOGY REPORT*  Clinical Data: Palpitations, tinnitus.  CT HEAD WITHOUT CONTRAST  Technique:  Contiguous axial images were obtained from the base of the skull through the vertex without contrast.  Comparison: MRI of the brain Jun 20, 2012.  Findings: The ventricles and sulci are normal for patient's age. No intraparenchymal hemorrhage, mass effect, midline shift to the or abnormal focal hypodensities.  No acute large vascular territory infarcts.  No abnormal extra-axial fluid collections.  Basal cisterns are patent. Visualized paranasal sinuses and mastoid air cells appear well aerated.  No skull fracture.  Included ocular globes and orbital contents are not suspicious but not tailored for evaluation.  Soft tissue within the bilateral external auditory canals likely reflective of cerumen.  IMPRESSION: No acute intracranial process; normal noncontrast CT of the head for age.   Original Report Authenticated By: Awilda Metro   Mr [facility] Wo Contrast  10/18/2012   CLINICAL DATA:  67 year old female with history of hypertension and previous corpus callosum infarct. Headache, left side numbness and tingling, heart palpations.  EXAM: MRI HEAD WITHOUT CONTRAST AND MRA HEAD WITHOUT CONTRAST AND MRA NECK WITHOUT CONTRAST  TECHNIQUE: Multiplanar, multiecho pulse sequences of the brain and surrounding structures were obtained without intravenous contrast. Angiographic images of the head were obtained using MRA technique without contrast. Multiplanar, multiecho pulse sequences of the neck and surrounding structures were obtained without intravenous contrast.  COMPARISON:  Head CT without contrast 10/17/2012. Brain MRI and MRA 06/20/2012.  FINDINGS: MRI HEAD FINDINGS  No restricted diffusion or evidence of acute infarction. Interval resolved  subtle T2 hyperintensity previously seen in the splenium of the corpus callosum. No encephalomalacia.  Major intracranial vascular flow voids are preserved.  No midline shift, mass effect, evidence of mass lesion, ventriculomegaly, extra-axial collection or acute intracranial hemorrhage. Cervicomedullary junction and pituitary are within normal limits. Negative visualized cervical spine. Minimal for age nonspecific cerebral white matter T2 and FLAIR hyperintensity elsewhere is stable.  Normal bone marrow signal. Visualized orbit soft tissues are within normal limits. Visualized paranasal sinuses and mastoids are clear. Negative scalp soft tissues.  MRA HEAD FINDINGS  Antegrade flow in the posterior circulation. Normal right PICA origin. Codominant distal vertebral arteries. Patent vertebrobasilar junction. Normal AICA origins. No basilar stenosis. SCA and PCA origins within normal limits. Diminutive left posterior communicating artery, the right is  not identified. Bilateral PCA branches within normal limits.  Antegrade flow in both ICA siphons. Normal ophthalmic artery origins. Mild ICA irregularity. No stenosis. Normal carotid termini. Normal MCA and ACA origins. Normal visualized bilateral MCA branches.  Tortuous proximal ACA branches, more so the left A1 segment. The anterior communicating artery is mildly ectatic and there is a median artery of the corpus callosum, but no ACA aneurysm is identified. No ACA stenosis, and otherwise negative visualized ACA branches.  MRA NECK FINDINGS  Time-of-flight images demonstrate antegrade flow in both carotid and vertebral arteries in the neck. Carotid bifurcations appear within normal limits. No evidence of hemodynamically significant stenosis in the neck.  IMPRESSION: MRI HEAD IMPRESSION  No acute intracranial abnormality and normal for age non contrast MRI appearance of the brain. No residual signal abnormality in the splenium where diffusion with abnormal on the previous  MRI.  MRA HEAD IMPRESSION  Negative intracranial MRA; mildly tortuous proximal ACA branches.  MRA NECK IMPRESSION  Negative non contrast neck MRA.   Electronically Signed   By: Augusto Gamble M.D.   On: 10/18/2012 11:40   Mr Angiogram Neck Wo Contrast  10/18/2012   CLINICAL DATA:  67 year old female with history of hypertension and previous corpus callosum infarct. Headache, left side numbness and tingling, heart palpations.  EXAM: MRI HEAD WITHOUT CONTRAST AND MRA HEAD WITHOUT CONTRAST AND MRA NECK WITHOUT CONTRAST  TECHNIQUE: Multiplanar, multiecho pulse sequences of the brain and surrounding structures were obtained without intravenous contrast. Angiographic images of the head were obtained using MRA technique without contrast. Multiplanar, multiecho pulse sequences of the neck and surrounding structures were obtained without intravenous contrast.  COMPARISON:  Head CT without contrast 10/17/2012. Brain MRI and MRA 06/20/2012.  FINDINGS: MRI HEAD FINDINGS  No restricted diffusion or evidence of acute infarction. Interval resolved subtle T2 hyperintensity previously seen in the splenium of the corpus callosum. No encephalomalacia.  Major intracranial vascular flow voids are preserved.  No midline shift, mass effect, evidence of mass lesion, ventriculomegaly, extra-axial collection or acute intracranial hemorrhage. Cervicomedullary junction and pituitary are within normal limits. Negative visualized cervical spine. Minimal for age nonspecific cerebral white matter T2 and FLAIR hyperintensity elsewhere is stable.  Normal bone marrow signal. Visualized orbit soft tissues are within normal limits. Visualized paranasal sinuses and mastoids are clear. Negative scalp soft tissues.  MRA HEAD FINDINGS  Antegrade flow in the posterior circulation. Normal right PICA origin. Codominant distal vertebral arteries. Patent vertebrobasilar junction. Normal AICA origins. No basilar stenosis. SCA and PCA origins within normal limits.  Diminutive left posterior communicating artery, the right is not identified. Bilateral PCA branches within normal limits.  Antegrade flow in both ICA siphons. Normal ophthalmic artery origins. Mild ICA irregularity. No stenosis. Normal carotid termini. Normal MCA and ACA origins. Normal visualized bilateral MCA branches.  Tortuous proximal ACA branches, more so the left A1 segment. The anterior communicating artery is mildly ectatic and there is a median artery of the corpus callosum, but no ACA aneurysm is identified. No ACA stenosis, and otherwise negative visualized ACA branches.  MRA NECK FINDINGS  Time-of-flight images demonstrate antegrade flow in both carotid and vertebral arteries in the neck. Carotid bifurcations appear within normal limits. No evidence of hemodynamically significant stenosis in the neck.  IMPRESSION: MRI HEAD IMPRESSION  No acute intracranial abnormality and normal for age non contrast MRI appearance of the brain. No residual signal abnormality in the splenium where diffusion with abnormal on the previous MRI.  MRA HEAD IMPRESSION  Negative intracranial MRA; mildly tortuous proximal ACA branches.  MRA NECK IMPRESSION  Negative non contrast neck MRA.   Electronically Signed   By: Augusto Gamble M.D.   On: 10/18/2012 11:40   Mr Brain Wo Contrast  10/18/2012   CLINICAL DATA:  67 year old female with history of hypertension and previous corpus callosum infarct. Headache, left side numbness and tingling, heart palpations.  EXAM: MRI HEAD WITHOUT CONTRAST AND MRA HEAD WITHOUT CONTRAST AND MRA NECK WITHOUT CONTRAST  TECHNIQUE: Multiplanar, multiecho pulse sequences of the brain and surrounding structures were obtained without intravenous contrast. Angiographic images of the head were obtained using MRA technique without contrast. Multiplanar, multiecho pulse sequences of the neck and surrounding structures were obtained without intravenous contrast.  COMPARISON:  Head CT without contrast  10/17/2012. Brain MRI and MRA 06/20/2012.  FINDINGS: MRI HEAD FINDINGS  No restricted diffusion or evidence of acute infarction. Interval resolved subtle T2 hyperintensity previously seen in the splenium of the corpus callosum. No encephalomalacia.  Major intracranial vascular flow voids are preserved.  No midline shift, mass effect, evidence of mass lesion, ventriculomegaly, extra-axial collection or acute intracranial hemorrhage. Cervicomedullary junction and pituitary are within normal limits. Negative visualized cervical spine. Minimal for age nonspecific cerebral white matter T2 and FLAIR hyperintensity elsewhere is stable.  Normal bone marrow signal. Visualized orbit soft tissues are within normal limits. Visualized paranasal sinuses and mastoids are clear. Negative scalp soft tissues.  MRA HEAD FINDINGS  Antegrade flow in the posterior circulation. Normal right PICA origin. Codominant distal vertebral arteries. Patent vertebrobasilar junction. Normal AICA origins. No basilar stenosis. SCA and PCA origins within normal limits. Diminutive left posterior communicating artery, the right is not identified. Bilateral PCA branches within normal limits.  Antegrade flow in both ICA siphons. Normal ophthalmic artery origins. Mild ICA irregularity. No stenosis. Normal carotid termini. Normal MCA and ACA origins. Normal visualized bilateral MCA branches.  Tortuous proximal ACA branches, more so the left A1 segment. The anterior communicating artery is mildly ectatic and there is a median artery of the corpus callosum, but no ACA aneurysm is identified. No ACA stenosis, and otherwise negative visualized ACA branches.  MRA NECK FINDINGS  Time-of-flight images demonstrate antegrade flow in both carotid and vertebral arteries in the neck. Carotid bifurcations appear within normal limits. No evidence of hemodynamically significant stenosis in the neck.  IMPRESSION: MRI HEAD IMPRESSION  No acute intracranial abnormality and  normal for age non contrast MRI appearance of the brain. No residual signal abnormality in the splenium where diffusion with abnormal on the previous MRI.  MRA HEAD IMPRESSION  Negative intracranial MRA; mildly tortuous proximal ACA branches.  MRA NECK IMPRESSION  Negative non contrast neck MRA.   Electronically Signed   By: Augusto Gamble M.D.   On: 10/18/2012 11:40   Mr Cervical Spine W Wo Contrast  10/19/2012   CLINICAL DATA:  67 year old female with left-sided paresthesia. Cervical myelopathy. Initial encounter.  EXAM: MRI CERVICAL SPINE WITHOUT AND WITH CONTRAST  TECHNIQUE: Multiplanar and multiecho pulse sequences of the cervical spine, to include the craniocervical junction and cervicothoracic junction, were obtained according to standard protocol without and with intravenous contrast.  CONTRAST:  14mL MULTIHANCE GADOBENATE DIMEGLUMINE 529 MG/ML IV SOLN  COMPARISON:  Cervical spine radiographs 03/08/2006. Brain MRI 10/18/2012.  FINDINGS: Cervicomedullary junction is within normal limits. Normal bone marrow signal at the skullbase. Widespread degenerative endplate marrow changes in the cervical spine. Loss of cervical lordosis since 2008. No definite marrow edema or acute osseous abnormality.  Despite spinal stenosis (see below) , Spinal cord signal is within normal limits at all visualized levels.  9 mm mildly T2 hyperintense left thyroid nodule, too small to characterize most likely benign and significance doubtful in this age group. Partially retropharyngeal course of the right carotid artery. Other Visualized paraspinal soft tissues are within normal limits. No abnormal enhancement identified.  C2-C3: Negative.  C3-C4: Circumferential disc osteophyte complex. Broad-based posterior component of disc. Mild ligament flavum hypertrophy. Spinal stenosis with mild spinal cord flattening. Borderline to mild left C4 foraminal stenosis.  C4-C5: Circumferential disc osteophyte complex. Mild ligament flavum  hypertrophy. Broad-based posterior component of disc. Mild to moderate spinal stenosis with spinal cord flattening. Left greater than right uncovertebral hypertrophy. Moderate bilateral C5 foraminal stenosis.  C5-C6: Circumferential disc osteophyte complex eccentric to the left. Slightly lobulated posterior component of disc. Mild to moderate spinal stenosis with spinal cord flattening. Left greater than right uncovertebral hypertrophy. Severe left and mild right C6 foraminal stenosis.  C6-C7: Mild circumferential disc bulge. Superimposed right paracentral disc protrusion (series 5, image 22). Spinal stenosis with mild spinal cord flattening. Mild uncovertebral hypertrophy. Mild left greater than right C7 foraminal stenosis.  C7-T1: Mild facet and uncovertebral hypertrophy. No significant stenosis.  No upper thoracic spinal stenosis.  IMPRESSION: 1. Multifactorial mild to moderate degenerative cervical spinal stenosis from C3-C4 to C6-C7 , most pronounced at the middle 2 levels. Spinal cord mass effect but no spinal cord signal abnormality.  2. Multifactorial moderate or severe neural foraminal stenosis at the bilateral C5 and left C6 nerve levels.   Electronically Signed   By: Augusto Gamble M.D.   On: 10/19/2012 16:12    Microbiology: No results found for this or any previous visit (from the past 240 hour(s)).   Labs: Basic Metabolic Panel:  Recent Labs Lab 10/17/12 2258 10/18/12 0131 10/19/12 0513 10/20/12 0605  NA 141  --  140 140  K 3.0*  --  3.8 3.5  CL 98  --  102 99  CO2 25  --  29 31  GLUCOSE 145*  --  116* 113*  BUN 8  --  10 10  CREATININE 0.92  --  1.06 0.99  CALCIUM 9.2  --  8.4 8.9  MG  --  1.6  --   --    Liver Function Tests: No results found for this basename: AST, ALT, ALKPHOS, BILITOT, PROT, ALBUMIN,  in the last 168 hours No results found for this basename: LIPASE, AMYLASE,  in the last 168 hours No results found for this basename: AMMONIA,  in the last 168  hours CBC:  Recent Labs Lab 10/17/12 2258  WBC 11.4*  NEUTROABS 9.3*  HGB 12.4  HCT 36.2  MCV 90.7  PLT 229   Cardiac Enzymes:  Recent Labs Lab 10/17/12 2315  TROPONINI <0.30   BNP: BNP (last 3 results) No results found for this basename: PROBNP,  in the last 8760 hours CBG:  Recent Labs Lab 10/19/12 0648 10/19/12 1214 10/19/12 1636 10/19/12 2201 10/20/12 1147  GLUCAP 103* 169* 116* 131* 140*       Signed:  Loreen Bankson,CHRISTOPHER  [facility] 10/20/2012, 12:34 PM

## 2012-12-19 ENCOUNTER — Emergency Department (HOSPITAL_COMMUNITY): Payer: PRIVATE HEALTH INSURANCE

## 2012-12-19 ENCOUNTER — Encounter (HOSPITAL_COMMUNITY): Payer: Self-pay | Admitting: Emergency Medicine

## 2012-12-19 ENCOUNTER — Emergency Department (HOSPITAL_COMMUNITY)
Admission: EM | Admit: 2012-12-19 | Discharge: 2012-12-19 | Disposition: A | Discharge status: Home / Self Care | Payer: PRIVATE HEALTH INSURANCE | Attending: Emergency Medicine | Admitting: Emergency Medicine

## 2012-12-19 DIAGNOSIS — Z8673 Personal history of transient ischemic attack (TIA), and cerebral infarction without residual deficits: Secondary | ICD-10-CM | POA: Insufficient documentation

## 2012-12-19 DIAGNOSIS — E876 Hypokalemia: Secondary | ICD-10-CM

## 2012-12-19 DIAGNOSIS — E119 Type 2 diabetes mellitus without complications: Secondary | ICD-10-CM | POA: Insufficient documentation

## 2012-12-19 DIAGNOSIS — N949 Unspecified condition associated with female genital organs and menstrual cycle: Secondary | ICD-10-CM | POA: Insufficient documentation

## 2012-12-19 DIAGNOSIS — R5381 Other malaise: Secondary | ICD-10-CM | POA: Insufficient documentation

## 2012-12-19 DIAGNOSIS — Z79899 Other long term (current) drug therapy: Secondary | ICD-10-CM | POA: Insufficient documentation

## 2012-12-19 DIAGNOSIS — I10 Essential (primary) hypertension: Secondary | ICD-10-CM | POA: Insufficient documentation

## 2012-12-19 LAB — BASIC METABOLIC PANEL
BUN: 16 mg/dL (ref 6–23)
CO2: 28 mEq/L (ref 19–32)
Calcium: 9.3 mg/dL (ref 8.4–10.5)
Chloride: 99 mEq/L (ref 96–112)
Creatinine, Ser: 0.95 mg/dL (ref 0.50–1.10)
GFR calc Af Amer: 71 mL/min — ABNORMAL LOW (ref >90)
GFR calc non Af Amer: 62 mL/min — ABNORMAL LOW (ref >90)
Glucose, Bld: 44 mg/dL — CL (ref 70–99)
Potassium: 3 mEq/L — ABNORMAL LOW (ref 3.5–5.1)
Sodium: 140 mEq/L (ref 135–145)

## 2012-12-19 LAB — CBC WITH DIFFERENTIAL/PLATELET
Basophils Absolute: 0 10*3/uL (ref 0.0–0.1)
Basophils Relative: 0 % (ref 0–1)
Eosinophils Absolute: 0.1 10*3/uL (ref 0.0–0.7)
Eosinophils Relative: 1 % (ref 0–5)
HCT: 36.1 % (ref 36.0–46.0)
Hemoglobin: 12.4 g/dL (ref 12.0–15.0)
Lymphocytes Relative: 14 % (ref 12–46)
Lymphs Abs: 1.3 10*3/uL (ref 0.7–4.0)
MCH: 30.1 pg (ref 26.0–34.0)
MCHC: 34.3 g/dL (ref 30.0–36.0)
MCV: 87.6 fL (ref 78.0–100.0)
Monocytes Absolute: 0.6 10*3/uL (ref 0.1–1.0)
Monocytes Relative: 7 % (ref 3–12)
Neutro Abs: 6.7 10*3/uL (ref 1.7–7.7)
Neutrophils Relative %: 77 % (ref 43–77)
Platelets: 212 10*3/uL (ref 150–400)
RBC: 4.12 MIL/uL (ref 3.87–5.11)
RDW: 13.3 % (ref 11.5–15.5)
WBC: 8.7 10*3/uL (ref 4.0–10.5)

## 2012-12-19 LAB — URINALYSIS, ROUTINE W REFLEX MICROSCOPIC
Bilirubin Urine: NEGATIVE
Glucose, UA: NEGATIVE mg/dL
Hgb urine dipstick: NEGATIVE
Ketones, ur: NEGATIVE mg/dL
Leukocytes, UA: NEGATIVE
Nitrite: NEGATIVE
Protein, ur: NEGATIVE mg/dL
Specific Gravity, Urine: 1.012 (ref 1.005–1.030)
Urobilinogen, UA: 0.2 mg/dL (ref 0.0–1.0)
pH: 8.5 — ABNORMAL HIGH (ref 5.0–8.0)

## 2012-12-19 LAB — GLUCOSE, CAPILLARY: Glucose-Capillary: 108 mg/dL — ABNORMAL HIGH (ref 70–99)

## 2012-12-19 MED ORDER — POTASSIUM CHLORIDE CRYS ER 20 MEQ PO TBCR
40.0000 meq | EXTENDED_RELEASE_TABLET | Freq: Once | ORAL | Status: AC
  Administered 2012-12-19: 40 meq via ORAL
  Filled 2012-12-19: qty 2

## 2012-12-19 MED ORDER — ALUM & MAG HYDROXIDE-SIMETH 200-200-20 MG/5ML PO SUSP
30.0000 mL | Freq: Once | ORAL | Status: AC
  Administered 2012-12-19: 30 mL via ORAL
  Filled 2012-12-19: qty 30

## 2012-12-19 MED ORDER — POTASSIUM CHLORIDE CRYS ER 20 MEQ PO TBCR: 20.0000 meq | EXTENDED_RELEASE_TABLET | Freq: Every day | ORAL | Status: DC

## 2012-12-19 MED ORDER — ACETAMINOPHEN 325 MG PO TABS
650.0000 mg | ORAL_TABLET | Freq: Once | ORAL | Status: AC
  Administered 2012-12-19: 650 mg via ORAL
  Filled 2012-12-19: qty 2

## 2012-12-19 NOTE — ED Notes (Signed)
MD Wentz at bedside.  

## 2012-12-19 NOTE — ED Notes (Signed)
Pt c/o abd pain, body aches and vaginal pain; pt poor historian and sts she hurts "all over her stomach and private parts"; pt sts decreased appetite

## 2012-12-19 NOTE — ED Provider Notes (Signed)
CSN: [id number]     Arrival date & time 12/19/12  1233 History   First MD Initiated Contact with Patient 12/19/12 1346     Chief Complaint  Patient presents with  . Abdominal Pain  . Vaginal Pain   (Consider location/radiation/quality/duration/timing/severity/associated sxs/prior Treatment) HPI Comments: [name] is a 67 y.o. female who presents for evaluation of generalized achiness and poor appetite for several months. She describes the onset after her stroke in May 2014. She has ongoing anorexia. There's been no nausea or vomiting, or diarrhea. She denies fever, or chills. She sees her doctor regularly at the [facility]. She denies depression, crying, suicidal ideation. There are no other known modifying factors.   Patient is a 67 y.o. female presenting with abdominal pain and vaginal pain. The history is provided by the patient.  Abdominal Pain Vaginal Pain Associated symptoms include abdominal pain.    Past Medical History  Diagnosis Date  . Diabetes mellitus without complication   . Hypertension   . Stroke    Past Surgical History  Procedure Laterality Date  . Abdominal hysterectomy     History reviewed. No pertinent family history. History  Substance Use Topics  . Smoking status: Never Smoker   . Smokeless tobacco: Not on file  . Alcohol Use: No   OB History   Grav Para Term Preterm Abortions TAB SAB Ect Mult Living                 Review of Systems  Gastrointestinal: Positive for abdominal pain.  Genitourinary: Positive for vaginal pain.  All other systems reviewed and are negative.    Allergies  Codeine and Ibuprofen  Home Medications   Current Outpatient Rx  Name  Route  Sig  Dispense  Refill  . atenolol-chlorthalidone (TENORETIC) 50-25 MG per tablet   Oral   Take 1 tablet by mouth daily.   30 tablet   1   . atorvastatin (LIPITOR) 10 MG tablet   Oral   Take 1 tablet (10 mg total) by mouth daily at 6 PM.   30 tablet   0   .  CARAFATE 1 GM/10ML suspension   Oral   Take 10 mLs by mouth 2 (two) times daily.         . cloNIDine (CATAPRES) 0.2 MG tablet   Oral   Take 0.2 mg by mouth 2 (two) times daily.         . clopidogrel (PLAVIX) 75 MG tablet   Oral   Take 1 tablet (75 mg total) by mouth daily with breakfast.   30 tablet   1   . dicyclomine (BENTYL) 20 MG tablet   Oral   Take 1 tablet (20 mg total) by mouth 2 (two) times daily.   20 tablet   0   . ezetimibe (ZETIA) 10 MG tablet   Oral   Take 1 tablet (10 mg total) by mouth daily.   30 tablet   1   . famotidine (PEPCID) 20 MG tablet   Oral   Take 20 mg by mouth daily.          . feeding supplement (ENSURE COMPLETE) LIQD   Oral   Take 237 mLs by mouth 2 (two) times daily between meals.   60 Bottle   0   . glipiZIDE-metformin (METAGLIP) 5-500 MG per tablet   Oral   Take 1-2 tablets by mouth 2 (two) times daily before a meal. Takes 2 tablets in the morning and  1 tablet at bedtime         . hydrALAZINE (APRESOLINE) 50 MG tablet   Oral   Take 50 mg by mouth 3 (three) times daily.         Marland Kitchen losartan (COZAAR) 100 MG tablet   Oral   Take 1 tablet (100 mg total) by mouth daily.   30 tablet   1   . pantoprazole (PROTONIX) 40 MG tablet   Oral   Take 1 tablet (40 mg total) by mouth daily.   30 tablet   1   . potassium chloride SA (K-DUR,KLOR-CON) 20 MEQ tablet   Oral   Take 1 tablet (20 mEq total) by mouth daily.   10 tablet   0   . ROZEREM 8 MG tablet   Oral   Take 8 mg by mouth at bedtime.         . simethicone (MYLICON) 80 MG chewable tablet   Oral   Chew 80 mg by mouth every 4 (four) hours as needed for flatulence.         . traMADol (ULTRAM) 50 MG tablet   Oral   Take 50 mg by mouth every 6 (six) hours as needed for pain.          Marland Kitchen zolpidem (AMBIEN) 5 MG tablet   Oral   Take 1 tablet (5 mg total) by mouth at bedtime.   30 tablet   0    BP 105/78  Pulse 70  Temp(Src) 97.4 F (36.3 C) (Oral)  Resp  18  Ht 5\' 6"  (1.676 m)  Wt 141 lb 8 oz (64.184 kg)  BMI 22.85 kg/m2  SpO2 98% Physical Exam  Nursing note and vitals reviewed. Constitutional: She is oriented to person, place, and time. She appears well-developed.  Elderly, appears older than stated age  HENT:  Head: Normocephalic and atraumatic.  Eyes: Conjunctivae and EOM are normal. Pupils are equal, round, and reactive to light.  Neck: Normal range of motion and phonation normal. Neck supple.  Cardiovascular: Normal rate, regular rhythm and intact distal pulses.   Pulmonary/Chest: Effort normal and breath sounds normal. She exhibits no tenderness.  Abdominal: Soft. She exhibits no distension. There is no tenderness. There is no guarding.  Musculoskeletal: Normal range of motion.  Neurological: She is alert and oriented to person, place, and time. No cranial nerve deficit. She exhibits normal muscle tone. Coordination normal.  Skin: Skin is warm and dry.  Psychiatric: Her behavior is normal. Judgment and thought content normal.  She appears depressed    ED Course  Procedures (including critical care time) Labs Review Labs Reviewed  BASIC METABOLIC PANEL - Abnormal; Notable for the following:    Potassium 3.0 (*)    GFR calc non Af Amer 62 (*)    GFR calc Af Amer 71 (*)    All other components within normal limits  URINALYSIS, ROUTINE W REFLEX MICROSCOPIC - Abnormal; Notable for the following:    pH 8.5 (*)    All other components within normal limits  URINE CULTURE  CBC WITH DIFFERENTIAL   Imaging Review Dg Chest 2 View  12/19/2012   CLINICAL DATA:  Headache.  Body aches.  EXAM: CHEST  2 VIEW  COMPARISON:  09/16/2012.  FINDINGS: The heart size and mediastinal contours are within normal limits. Both lungs are clear. The visualized skeletal structures are unremarkable.  IMPRESSION: No active cardiopulmonary disease.   Electronically Signed   By: Maisie Fus  Register   On:  12/19/2012 14:31    EKG Interpretation   None        MDM   1. Malaise   2. Hypokalemia     Nonspecific chronic symptoms without apparent severe systemic illness. She has incidental hypokalemia related to diuretic medication. She is stable for discharge and outpatient management   Nursing Notes Reviewed/ Care Coordinated, and agree without changes. Applicable Imaging Reviewed.  Interpretation of Laboratory Data incorporated into ED treatment   Plan: Home Medications- potassium; Home Treatments and Observation- rest and fluids; return here if the recommended treatment, does not improve the symptoms; Recommended follow up- PCP, for check up in 3-4      Flint Melter, MD 12/19/12 1542

## 2012-12-19 NOTE — ED Notes (Signed)
Pt reports vaginal pain and abdominal pain x "months." Pt states "it's been hurting ever since the last time I was here and the doctor didn't keep me. I am just plain hurting." Pt at this time refusing to be changed in the gown. States "he can listen to my heart, but that's it." Pt denies nausea. Reports abdominal tenderness. Pt tearful.

## 2012-12-21 LAB — URINE CULTURE: Colony Count: >=100000

## 2012-12-22 NOTE — ED Notes (Signed)
+   Urine  No treatment necessary per Irish Elders

## 2013-05-12 ENCOUNTER — Emergency Department (HOSPITAL_COMMUNITY)
Admission: EM | Admit: 2013-05-12 | Discharge: 2013-05-12 | Disposition: A | Discharge status: Home / Self Care | Payer: PRIVATE HEALTH INSURANCE | Attending: Emergency Medicine | Admitting: Emergency Medicine

## 2013-05-12 ENCOUNTER — Encounter (HOSPITAL_COMMUNITY): Payer: Self-pay | Admitting: Emergency Medicine

## 2013-05-12 DIAGNOSIS — E119 Type 2 diabetes mellitus without complications: Secondary | ICD-10-CM | POA: Insufficient documentation

## 2013-05-12 DIAGNOSIS — R519 Headache, unspecified: Secondary | ICD-10-CM

## 2013-05-12 DIAGNOSIS — R51 Headache: Secondary | ICD-10-CM | POA: Insufficient documentation

## 2013-05-12 DIAGNOSIS — Z8673 Personal history of transient ischemic attack (TIA), and cerebral infarction without residual deficits: Secondary | ICD-10-CM | POA: Insufficient documentation

## 2013-05-12 DIAGNOSIS — F411 Generalized anxiety disorder: Secondary | ICD-10-CM | POA: Insufficient documentation

## 2013-05-12 DIAGNOSIS — I10 Essential (primary) hypertension: Secondary | ICD-10-CM

## 2013-05-12 DIAGNOSIS — Z7982 Long term (current) use of aspirin: Secondary | ICD-10-CM | POA: Insufficient documentation

## 2013-05-12 DIAGNOSIS — Z79899 Other long term (current) drug therapy: Secondary | ICD-10-CM | POA: Insufficient documentation

## 2013-05-12 MED ORDER — ALPRAZOLAM 0.25 MG PO TABS: 0.2500 mg | ORAL_TABLET | Freq: Every evening | ORAL | Status: DC | PRN

## 2013-05-12 MED ORDER — CLONIDINE HCL 0.1 MG PO TABS
0.1000 mg | ORAL_TABLET | Freq: Once | ORAL | Status: AC
  Administered 2013-05-12: 0.1 mg via ORAL
  Filled 2013-05-12: qty 1

## 2013-05-12 MED ORDER — LORAZEPAM 1 MG PO TABS
1.0000 mg | ORAL_TABLET | Freq: Once | ORAL | Status: AC
  Administered 2013-05-12: 1 mg via ORAL
  Filled 2013-05-12: qty 1

## 2013-05-12 MED ORDER — METOCLOPRAMIDE HCL 5 MG/ML IJ SOLN
10.0000 mg | Freq: Once | INTRAMUSCULAR | Status: AC
  Administered 2013-05-12: 10 mg via INTRAMUSCULAR
  Filled 2013-05-12: qty 2

## 2013-05-12 MED ORDER — DIPHENHYDRAMINE HCL 50 MG/ML IJ SOLN
25.0000 mg | Freq: Once | INTRAMUSCULAR | Status: AC
  Administered 2013-05-12: 21:00:00 via INTRAMUSCULAR
  Filled 2013-05-12: qty 1

## 2013-05-12 NOTE — ED Notes (Signed)
Patient is complaining of headache and has not taken blood pressure medicine today.  EMS picked her up at the pharmacy.  She had a stroke last year but has had a severe headache over three times per ems since her stroke.  Pt is unable to pinpoint a time her headache started saying she has had it all day.

## 2013-05-12 NOTE — ED Provider Notes (Signed)
CSN: [id number]     Arrival date & time 05/12/13  1937 History   First MD Initiated Contact with Patient 05/12/13 2007     Chief Complaint  Patient presents with  . Headache     (Consider location/radiation/quality/duration/timing/severity/associated sxs/prior Treatment) HPI Comments: Pt has a h/o HTN, HA's when her BP goes up, no migraines, had gradual onset of diffuse HA today similar to prior HA's and she blames her HTN.  She is on 3 or 4 meds for BP, is upset at present because she seems aggravated that her PMD doesn't call in her BP meds on time.  She speaks several times about her doctor not giving her her prescriptions on time.  She has not taken her clonidine yet, she just got them filed at the pharmacy.  She is also on hydralazine twice daily, cozaar and tenoretic as well.  No focal numbness or weakness, no slurred speech or visual changes.    Patient is a 68 y.o. female presenting with headaches. The history is provided by the patient.  Headache Pain location:  Generalized Associated symptoms: no fever, no nausea, no neck pain, no neck stiffness, no numbness, no photophobia and no vomiting     Past Medical History  Diagnosis Date  . Diabetes mellitus without complication   . Hypertension   . Stroke    Past Surgical History  Procedure Laterality Date  . Abdominal hysterectomy     History reviewed. No pertinent family history. History  Substance Use Topics  . Smoking status: Never Smoker   . Smokeless tobacco: Not on file  . Alcohol Use: No   OB History   Grav Para Term Preterm Abortions TAB SAB Ect Mult Living                 Review of Systems  Constitutional: Negative for fever.  Eyes: Negative for photophobia and visual disturbance.  Respiratory: Negative for chest tightness and shortness of breath.   Gastrointestinal: Negative for nausea and vomiting.  Musculoskeletal: Negative for neck pain and neck stiffness.  Neurological: Positive for headaches. Negative  for syncope, weakness and numbness.  All other systems reviewed and are negative.     Allergies  Codeine and Ibuprofen  Home Medications   Prior to Admission medications   Medication Sig Start Date End Date Taking? Authorizing Provider  ALPRAZolam Prudy Feeler(XANAX) 0.25 MG tablet Take 0.25 mg by mouth at bedtime as needed for anxiety.   Yes Historical Provider, MD  aspirin 81 MG tablet Take 81 mg by mouth daily.   Yes Historical Provider, MD  atenolol-chlorthalidone (TENORETIC) 100-25 MG per tablet Take 1 tablet by mouth daily.   Yes Historical Provider, MD  atorvastatin (LIPITOR) 10 MG tablet Take 1 tablet (10 mg total) by mouth daily at 6 PM. 06/27/12  Yes Mcarthur Rossettianiel J Angiulli, PA-C  cloNIDine (CATAPRES) 0.1 MG tablet Take 0.1 mg by mouth 2 (two) times daily.   Yes Historical Provider, MD  famotidine (PEPCID) 40 MG tablet Take 40 mg by mouth daily.   Yes Historical Provider, MD  glipiZIDE-metformin (METAGLIP) 5-500 MG per tablet Take 1 tablet by mouth at bedtime.   Yes Historical Provider, MD  hydrALAZINE (APRESOLINE) 50 MG tablet Take 50 mg by mouth 2 (two) times daily.    Yes Historical Provider, MD  losartan (COZAAR) 100 MG tablet Take 1 tablet (100 mg total) by mouth daily. 06/27/12  Yes Daniel J Angiulli, PA-C  sodium-potassium bicarbonate (ALKA-SELTZER GOLD) TBEF dissolvable tablet Take 2 tablets by  mouth daily as needed (for indigestion).   Yes Historical Provider, MD   BP 154/63  Pulse 80  Temp(Src) 98.3 F (36.8 C)  Resp 18  Ht 5\' 6"  (1.676 m)  Wt 158 lb (71.668 kg)  BMI 25.51 kg/m2  SpO2 94% Physical Exam  Nursing note and vitals reviewed. Constitutional: She is oriented to person, place, and time. She appears well-developed and well-nourished. No distress.  HENT:  Head: Normocephalic and atraumatic.  Eyes: Conjunctivae and EOM are normal. No scleral icterus.  Neck: Normal range of motion. Neck supple.  Cardiovascular: Normal rate, regular rhythm and intact distal pulses.    Pulmonary/Chest: Effort normal and breath sounds normal. No respiratory distress. She has no wheezes.  Abdominal: Soft. Bowel sounds are normal.  Musculoskeletal: She exhibits no edema.  Neurological: She is alert and oriented to person, place, and time. Coordination normal.  No arm drift, no facial droop, normal finger to nose, 5/5 strength in B UE's and LE's.    Skin: Skin is warm. She is not diaphoretic.  Psychiatric: Her speech is normal. Her mood appears anxious.    ED Course  Procedures (including critical care time) Labs Review Labs Reviewed - No data to display  Imaging Review No results found.   EKG Interpretation SR at rate 92, early R transition in lead V2, borderline prolonged QT interval.  Abn ECG.      RA sat is 98% and I interpret to be adequate  10:39 PM Pt feels improved, HA improved, BP improved.  No focal neuro deficts.  I gave a refill of xanax for her sleep aid.  Pt encouraged to follow up with PMD.   Filed Vitals:   05/12/13 2230  BP: 154/63  Pulse: 80  Temp:   Resp: 18     MDM   Final diagnoses:  Headache  Hypertension    Will give pt a dose of clonidine, her usual PM dose of hydralazine and a HA cocktail for her symptoms and monitor for improvement.  No focal deficits on exam currently.      Gavin PoundMichael Y. Enis Riecke, MD 05/12/13 2240

## 2013-05-12 NOTE — Discharge Instructions (Signed)
Arterial Hypertension °Arterial hypertension (high blood pressure) is a condition of elevated pressure in your blood vessels. Hypertension over a long period of time is a risk factor for strokes, heart attacks, and heart failure. It is also the leading cause of kidney (renal) failure.  °CAUSES  °· In Adults -- Over 90% of all hypertension has no known cause. This is called essential or primary hypertension. In the other 10% of people with hypertension, the increase in blood pressure is caused by another disorder. This is called secondary hypertension. Important causes of secondary hypertension are: °· Heavy alcohol use. °· Obstructive sleep apnea. °· Hyperaldosterosim (Conn's syndrome). °· Steroid use. °· Chronic kidney failure. °· Hyperparathyroidism. °· Medications. °· Renal artery stenosis. °· Pheochromocytoma. °· Cushing's disease. °· Coarctation of the aorta. °· Scleroderma renal crisis. °· Licorice (in excessive amounts). °· Drugs (cocaine, methamphetamine). °Your caregiver can explain any items above that apply to you. °· In Children -- Secondary hypertension is more common and should always be considered. °· Pregnancy -- Few women of childbearing age have high blood pressure. However, up to 10% of them develop hypertension of pregnancy. Generally, this will not harm the woman. It may be a sign of 3 complications of pregnancy: preeclampsia, HELLP syndrome, and eclampsia. Follow up and control with medication is necessary. °SYMPTOMS  °· This condition normally does not produce any noticeable symptoms. It is usually found during a routine exam. °· Malignant hypertension is a late problem of high blood pressure. It may have the following symptoms: °· Headaches. °· Blurred vision. °· End-organ damage (this means your kidneys, heart, lungs, and other organs are being damaged). °· Stressful situations can increase the blood pressure. If a person with normal blood pressure has their blood pressure go up while being  seen by their caregiver, this is often termed "white coat hypertension." Its importance is not known. It may be related with eventually developing hypertension or complications of hypertension. °· Hypertension is often confused with mental tension, stress, and anxiety. °DIAGNOSIS  °The diagnosis is made by 3 separate blood pressure measurements. They are taken at least 1 week apart from each other. If there is organ damage from hypertension, the diagnosis may be made without repeat measurements. °Hypertension is usually identified by having blood pressure readings: °· Above 140/90 mmHg measured in both arms, at 3 separate times, over a couple weeks. °· Over 130/80 mmHg should be considered a risk factor and may require treatment in patients with diabetes. °Blood pressure readings over 120/80 mmHg are called "pre-hypertension" even in non-diabetic patients. °To get a true blood pressure measurement, use the following guidelines. Be aware of the factors that can alter blood pressure readings. °· Take measurements at least 1 hour after caffeine. °· Take measurements 30 minutes after smoking and without any stress. This is another reason to quit smoking  it raises your blood pressure. °· Use a proper cuff size. Ask your caregiver if you are not sure about your cuff size. °· Most home blood pressure cuffs are automatic. They will measure systolic and diastolic pressures. The systolic pressure is the pressure reading at the start of sounds. Diastolic pressure is the pressure at which the sounds disappear. If you are elderly, measure pressures in multiple postures. Try sitting, lying or standing. °· Sit at rest for a minimum of 5 minutes before taking measurements. °· You should not be on any medications like decongestants. These are found in many cold medications. °· Record your blood pressure readings and review   them with your caregiver. °If you have hypertension: °· Your caregiver may do tests to be sure you do not have  secondary hypertension (see "causes" above). °· Your caregiver may also look for signs of metabolic syndrome. This is also called Syndrome X or Insulin Resistance Syndrome. You may have this syndrome if you have type 2 diabetes, abdominal obesity, and abnormal blood lipids in addition to hypertension. °· Your caregiver will take your medical and family history and perform a physical exam. °· Diagnostic tests may include blood tests (for glucose, cholesterol, potassium, and kidney function), a urinalysis, or an EKG. Other tests may also be necessary depending on your condition. °PREVENTION  °There are important lifestyle issues that you can adopt to reduce your chance of developing hypertension: °· Maintain a normal weight. °· Limit the amount of salt (sodium) in your diet. °· Exercise often. °· Limit alcohol intake. °· Get enough potassium in your diet. Discuss specific advice with your caregiver. °· Follow a DASH diet (dietary approaches to stop hypertension). This diet is rich in fruits, vegetables, and low-fat dairy products, and avoids certain fats. °PROGNOSIS  °Essential hypertension cannot be cured. Lifestyle changes and medical treatment can lower blood pressure and reduce complications. The prognosis of secondary hypertension depends on the underlying cause. Many people whose hypertension is controlled with medicine or lifestyle changes can live a normal, healthy life.  °RISKS AND COMPLICATIONS  °While high blood pressure alone is not an illness, it often requires treatment due to its short- and long-term effects on many organs. Hypertension increases your risk for: °· CVAs or strokes (cerebrovascular accident). °· Heart failure due to chronically high blood pressure (hypertensive cardiomyopathy). °· Heart attack (myocardial infarction). °· Damage to the retina (hypertensive retinopathy). °· Kidney failure (hypertensive nephropathy). °Your caregiver can explain list items above that apply to you. Treatment  of hypertension can significantly reduce the risk of complications. °TREATMENT  °· For overweight patients, weight loss and regular exercise are recommended. Physical fitness lowers blood pressure. °· Mild hypertension is usually treated with diet and exercise. A diet rich in fruits and vegetables, fat-free dairy products, and foods low in fat and salt (sodium) can help lower blood pressure. Decreasing salt intake decreases blood pressure in a 1/3 of people. °· Stop smoking if you are a smoker. °The steps above are highly effective in reducing blood pressure. While these actions are easy to suggest, they are difficult to achieve. Most patients with moderate or severe hypertension end up requiring medications to bring their blood pressure down to a normal level. There are several classes of medications for treatment. Blood pressure pills (antihypertensives) will lower blood pressure by their different actions. Lowering the blood pressure by 10 mmHg may decrease the risk of complications by as much as 25%. °The goal of treatment is effective blood pressure control. This will reduce your risk for complications. Your caregiver will help you determine the best treatment for you according to your lifestyle. What is excellent treatment for one person, may not be for you. °HOME CARE INSTRUCTIONS  °· Do not smoke. °· Follow the lifestyle changes outlined in the "Prevention" section. °· If you are on medications, follow the directions carefully. Blood pressure medications must be taken as prescribed. Skipping doses reduces their benefit. It also puts you at risk for problems. °· Follow up with your caregiver, as directed. °· If you are asked to monitor your blood pressure at home, follow the guidelines in the "Diagnosis" section above. °SEEK MEDICAL CARE   IF:  °· You think you are having medication side effects. °· You have recurrent headaches or lightheadedness. °· You have swelling in your ankles. °· You have trouble with  your vision. °SEEK IMMEDIATE MEDICAL CARE IF:  °· You have sudden onset of chest pain or pressure, difficulty breathing, or other symptoms of a heart attack. °· You have a severe headache. °· You have symptoms of a stroke (such as sudden weakness, difficulty speaking, difficulty walking). °MAKE SURE YOU:  °· Understand these instructions. °· Will watch your condition. °· Will get help right away if you are not doing well or get worse. °Document Released: 01/08/2005 Document Revised: 04/02/2011 Document Reviewed: 08/08/2006 °[employer]® Patient Information ©2014 [employer]. ° °Headaches, Frequently Asked Questions °MIGRAINE HEADACHES °Q: What is migraine? What causes it? How can I treat it? °A: Generally, migraine headaches begin as a dull ache. Then they develop into a constant, throbbing, and pulsating pain. You may experience pain at the temples. You may experience pain at the front or back of one or both sides of the head. The pain is usually accompanied by a combination of: °· Nausea. °· Vomiting. °· Sensitivity to light and noise. °Some people (about 15%) experience an aura (see below) before an attack. The cause of migraine is believed to be chemical reactions in the brain. Treatment for migraine may include over-the-counter or prescription medications. It may also include self-help techniques. These include relaxation training and biofeedback.  °Q: What is an aura? °A: About 15% of people with migraine get an "aura". This is a sign of neurological symptoms that occur before a migraine headache. You may see wavy or jagged lines, dots, or flashing lights. You might experience tunnel vision or blind spots in one or both eyes. The aura can include visual or auditory hallucinations (something imagined). It may include disruptions in smell (such as strange odors), taste or touch. Other symptoms include: °· Numbness. °· A "pins and needles" sensation. °· Difficulty in recalling or speaking the correct word. °These  neurological events may last as long as 60 minutes. These symptoms will fade as the headache begins. °Q: What is a trigger? °A: Certain physical or environmental factors can lead to or "trigger" a migraine. These include: °· Foods. °· Hormonal changes. °· Weather. °· Stress. °It is important to remember that triggers are different for everyone. To help prevent migraine attacks, you need to figure out which triggers affect you. Keep a headache diary. This is a good way to track triggers. The diary will help you talk to your healthcare professional about your condition. °Q: Does weather affect migraines? °A: Bright sunshine, hot, humid conditions, and drastic changes in barometric pressure may lead to, or "trigger," a migraine attack in some people. But studies have shown that weather does not act as a trigger for everyone with migraines. °Q: What is the link between migraine and hormones? °A: Hormones start and regulate many of your body's functions. Hormones keep your body in balance within a constantly changing environment. The levels of hormones in your body are unbalanced at times. Examples are during menstruation, pregnancy, or menopause. That can lead to a migraine attack. In fact, about three quarters of all women with migraine report that their attacks are related to the menstrual cycle.  °Q: Is there an increased risk of stroke for migraine sufferers? °A: The likelihood of a migraine attack causing a stroke is very remote. That is not to say that migraine sufferers cannot have a stroke associated   with their migraines. In persons under age 40, the most common associated factor for stroke is migraine headache. But over the course of a person's normal life span, the occurrence of migraine headache may actually be associated with a reduced risk of dying from cerebrovascular disease due to stroke.  °Q: What are acute medications for migraine? °A: Acute medications are used to treat the pain of the headache after  it has started. Examples over-the-counter medications, NSAIDs, ergots, and triptans.  °Q: What are the triptans? °A: Triptans are the newest class of abortive medications. They are specifically targeted to treat migraine. Triptans are vasoconstrictors. They moderate some chemical reactions in the brain. The triptans work on receptors in your brain. Triptans help to restore the balance of a neurotransmitter called serotonin. Fluctuations in levels of serotonin are thought to be a main cause of migraine.  °Q: Are over-the-counter medications for migraine effective? °A: Over-the-counter, or "OTC," medications may be effective in relieving mild to moderate pain and associated symptoms of migraine. But you should see your caregiver before beginning any treatment regimen for migraine.  °Q: What are preventive medications for migraine? °A: Preventive medications for migraine are sometimes referred to as "prophylactic" treatments. They are used to reduce the frequency, severity, and length of migraine attacks. Examples of preventive medications include antiepileptic medications, antidepressants, beta-blockers, calcium channel blockers, and NSAIDs (nonsteroidal anti-inflammatory drugs). °Q: Why are anticonvulsants used to treat migraine? °A: During the past few years, there has been an increased interest in antiepileptic drugs for the prevention of migraine. They are sometimes referred to as "anticonvulsants". Both epilepsy and migraine may be caused by similar reactions in the brain.  °Q: Why are antidepressants used to treat migraine? °A: Antidepressants are typically used to treat people with depression. They may reduce migraine frequency by regulating chemical levels, such as serotonin, in the brain.  °Q: What alternative therapies are used to treat migraine? °A: The term "alternative therapies" is often used to describe treatments considered outside the scope of conventional Western medicine. Examples of alternative  therapy include acupuncture, acupressure, and yoga. Another common alternative treatment is herbal therapy. Some herbs are believed to relieve headache pain. Always discuss alternative therapies with your caregiver before proceeding. Some herbal products contain arsenic and other toxins. °TENSION HEADACHES °Q: What is a tension-type headache? What causes it? How can I treat it? °A: Tension-type headaches occur randomly. They are often the result of temporary stress, anxiety, fatigue, or anger. Symptoms include soreness in your temples, a tightening band-like sensation around your head (a "vice-like" ache). Symptoms can also include a pulling feeling, pressure sensations, and contracting head and neck muscles. The headache begins in your forehead, temples, or the back of your head and neck. Treatment for tension-type headache may include over-the-counter or prescription medications. Treatment may also include self-help techniques such as relaxation training and biofeedback. °CLUSTER HEADACHES °Q: What is a cluster headache? What causes it? How can I treat it? °A: Cluster headache gets its name because the attacks come in groups. The pain arrives with little, if any, warning. It is usually on one side of the head. A tearing or bloodshot eye and a runny nose on the same side of the headache may also accompany the pain. Cluster headaches are believed to be caused by chemical reactions in the brain. They have been described as the most severe and intense of any headache type. Treatment for cluster headache includes prescription medication and oxygen. °SINUS HEADACHES °Q: What is a sinus   headache? What causes it? How can I treat it? °A: When a cavity in the bones of the face and skull (a sinus) becomes inflamed, the inflammation will cause localized pain. This condition is usually the result of an allergic reaction, a tumor, or an infection. If your headache is caused by a sinus blockage, such as an infection, you will  probably have a fever. An x-ray will confirm a sinus blockage. Your caregiver's treatment might include antibiotics for the infection, as well as antihistamines or decongestants.  °REBOUND HEADACHES °Q: What is a rebound headache? What causes it? How can I treat it? °A: A pattern of taking acute headache medications too often can lead to a condition known as "rebound headache." A pattern of taking too much headache medication includes taking it more than 2 days per week or in excessive amounts. That means more than the label or a caregiver advises. With rebound headaches, your medications not only stop relieving pain, they actually begin to cause headaches. Doctors treat rebound headache by tapering the medication that is being overused. Sometimes your caregiver will gradually substitute a different type of treatment or medication. Stopping may be a challenge. Regularly overusing a medication increases the potential for serious side effects. Consult a caregiver if you regularly use headache medications more than 2 days per week or more than the label advises. °ADDITIONAL QUESTIONS AND ANSWERS °Q: What is biofeedback? °A: Biofeedback is a self-help treatment. Biofeedback uses special equipment to monitor your body's involuntary physical responses. Biofeedback monitors: °· Breathing. °· Pulse. °· Heart rate. °· Temperature. °· Muscle tension. °· Brain activity. °Biofeedback helps you refine and perfect your relaxation exercises. You learn to control the physical responses that are related to stress. Once the technique has been mastered, you do not need the equipment any more. °Q: Are headaches hereditary? °A: Four out of five (80%) of people that suffer report a family history of migraine. Scientists are not sure if this is genetic or a family predisposition. Despite the uncertainty, a child has a 50% chance of having migraine if one parent suffers. The child has a 75% chance if both parents suffer.  °Q: Can children  get headaches? °A: By the time they reach high school, most young people have experienced some type of headache. Many safe and effective approaches or medications can prevent a headache from occurring or stop it after it has begun.  °Q: What type of doctor should I see to diagnose and treat my headache? °A: Start with your primary caregiver. Discuss his or her experience and approach to headaches. Discuss methods of classification, diagnosis, and treatment. Your caregiver may decide to recommend you to a headache specialist, depending upon your symptoms or other physical conditions. Having diabetes, allergies, etc., may require a more comprehensive and inclusive approach to your headache. The [employer] will provide, upon request, a list of [employer]F physician members in your state. °Document Released: 03/31/2003 Document Revised: 04/02/2011 Document Reviewed: 09/08/2007 °[employer]® Patient Information ©2014 [employer]. ° °

## 2013-11-04 ENCOUNTER — Inpatient Hospital Stay (HOSPITAL_COMMUNITY)
Admission: EM | Admit: 2013-11-04 | Discharge: 2013-11-06 | DRG: 282 | Disposition: A | Discharge status: Home / Self Care | Payer: PRIVATE HEALTH INSURANCE | Attending: Internal Medicine | Admitting: Internal Medicine

## 2013-11-04 ENCOUNTER — Emergency Department (HOSPITAL_COMMUNITY): Payer: PRIVATE HEALTH INSURANCE

## 2013-11-04 ENCOUNTER — Encounter (HOSPITAL_COMMUNITY): Payer: Self-pay | Admitting: Emergency Medicine

## 2013-11-04 DIAGNOSIS — R079 Chest pain, unspecified: Secondary | ICD-10-CM

## 2013-11-04 DIAGNOSIS — I639 Cerebral infarction, unspecified: Secondary | ICD-10-CM | POA: Diagnosis present

## 2013-11-04 DIAGNOSIS — Z885 Allergy status to narcotic agent status: Secondary | ICD-10-CM

## 2013-11-04 DIAGNOSIS — I10 Essential (primary) hypertension: Secondary | ICD-10-CM | POA: Diagnosis present

## 2013-11-04 DIAGNOSIS — I214 Non-ST elevation (NSTEMI) myocardial infarction: Secondary | ICD-10-CM | POA: Diagnosis present

## 2013-11-04 DIAGNOSIS — R002 Palpitations: Secondary | ICD-10-CM

## 2013-11-04 DIAGNOSIS — E119 Type 2 diabetes mellitus without complications: Secondary | ICD-10-CM | POA: Diagnosis present

## 2013-11-04 DIAGNOSIS — R0789 Other chest pain: Secondary | ICD-10-CM

## 2013-11-04 DIAGNOSIS — R0602 Shortness of breath: Secondary | ICD-10-CM

## 2013-11-04 DIAGNOSIS — Z886 Allergy status to analgesic agent status: Secondary | ICD-10-CM

## 2013-11-04 DIAGNOSIS — K5900 Constipation, unspecified: Secondary | ICD-10-CM | POA: Diagnosis present

## 2013-11-04 DIAGNOSIS — K219 Gastro-esophageal reflux disease without esophagitis: Secondary | ICD-10-CM | POA: Diagnosis present

## 2013-11-04 DIAGNOSIS — E1169 Type 2 diabetes mellitus with other specified complication: Secondary | ICD-10-CM

## 2013-11-04 DIAGNOSIS — Z8673 Personal history of transient ischemic attack (TIA), and cerebral infarction without residual deficits: Secondary | ICD-10-CM | POA: Diagnosis present

## 2013-11-04 DIAGNOSIS — Z7982 Long term (current) use of aspirin: Secondary | ICD-10-CM

## 2013-11-04 HISTORY — DX: Personal history of other medical treatment: Z92.89

## 2013-11-04 HISTORY — DX: Gastro-esophageal reflux disease without esophagitis: K21.9

## 2013-11-04 LAB — BASIC METABOLIC PANEL
Anion gap: 22 — ABNORMAL HIGH (ref 5–15)
BUN: 8 mg/dL (ref 6–23)
CO2: 21 mEq/L (ref 19–32)
Calcium: 9.5 mg/dL (ref 8.4–10.5)
Chloride: 98 mEq/L (ref 96–112)
Creatinine, Ser: 0.94 mg/dL (ref 0.50–1.10)
GFR calc Af Amer: 72 mL/min — ABNORMAL LOW (ref >90)
GFR calc non Af Amer: 62 mL/min — ABNORMAL LOW (ref >90)
Glucose, Bld: 115 mg/dL — ABNORMAL HIGH (ref 70–99)
Potassium: 3.9 mEq/L (ref 3.7–5.3)
Sodium: 141 mEq/L (ref 137–147)

## 2013-11-04 LAB — CBC
HCT: 37.2 % (ref 36.0–46.0)
Hemoglobin: 12.3 g/dL (ref 12.0–15.0)
MCH: 30.3 pg (ref 26.0–34.0)
MCHC: 33.1 g/dL (ref 30.0–36.0)
MCV: 91.6 fL (ref 78.0–100.0)
Platelets: 276 10*3/uL (ref 150–400)
RBC: 4.06 MIL/uL (ref 3.87–5.11)
RDW: 13.1 % (ref 11.5–15.5)
WBC: 12.4 10*3/uL — ABNORMAL HIGH (ref 4.0–10.5)

## 2013-11-04 LAB — TROPONIN I: Troponin I: 1.3 ng/mL (ref <0.30)

## 2013-11-04 LAB — D-DIMER, QUANTITATIVE: D-Dimer, Quant: 1.22 ug/mL-FEU — ABNORMAL HIGH (ref 0.00–0.48)

## 2013-11-04 MED ORDER — ASPIRIN 81 MG PO CHEW
324.0000 mg | CHEWABLE_TABLET | Freq: Once | ORAL | Status: AC
  Administered 2013-11-04: 243 mg via ORAL
  Filled 2013-11-04: qty 4

## 2013-11-04 MED ORDER — IOHEXOL 350 MG/ML SOLN
100.0000 mL | Freq: Once | INTRAVENOUS | Status: AC | PRN
  Administered 2013-11-04: 100 mL via INTRAVENOUS

## 2013-11-04 MED ORDER — LORAZEPAM 2 MG/ML IJ SOLN
1.0000 mg | Freq: Once | INTRAMUSCULAR | Status: AC
  Administered 2013-11-04: 1 mg via INTRAVENOUS
  Filled 2013-11-04: qty 1

## 2013-11-04 NOTE — ED Notes (Signed)
Per EMS, comes in from home with c/o heart palpitations, feels like her "head is pounding." Pt denies chest pain, SOB, N/V or diaphoresis. Pt has h/o stroke and hypertension. Pt has taken meds today VSS: 209/83, P107, CBG 101. 20G IV placed.

## 2013-11-04 NOTE — ED Provider Notes (Signed)
CSN: [id number]     Arrival date & time 11/04/13  1801 History   First MD Initiated Contact with Patient 11/04/13 2001     No chief complaint on file.  (Consider location/radiation/quality/duration/timing/severity/associated sxs/prior Treatment) HPI [name] is a 68 yo female presenting with report of feeling her heart beating fast onset about 6 hours PTA. She states she was watching TV today when she felt like her heart was pounding in her chest and her head. She reports 10/10 chest pain but states it only "hurts a little".   When asked to pinpoint her chest pain she points under her left breast. She also complains of hurting all over and feeling like she wants to have a bowel movement but being constipated for a few days.  She reports sometimes feeling like she is short of breath but denies cough, hemoptysis, unilateral leg swelling nausea, vomiting, abd pain or diaphoresis.   Past Medical History  Diagnosis Date  . Diabetes mellitus without complication   . Hypertension   . Stroke    Past Surgical History  Procedure Laterality Date  . Abdominal hysterectomy     No family history on file. History  Substance Use Topics  . Smoking status: Never Smoker   . Smokeless tobacco: Not on file  . Alcohol Use: No   OB History   Grav Para Term Preterm Abortions TAB SAB Ect Mult Living                 Review of Systems  Constitutional: Negative for fever and chills.  HENT: Negative for sore throat.   Eyes: Negative for visual disturbance.  Respiratory: Positive for shortness of breath. Negative for cough.   Cardiovascular: Positive for chest pain. Negative for leg swelling.  Gastrointestinal: Positive for constipation. Negative for nausea, vomiting and diarrhea.  Genitourinary: Negative for dysuria.  Musculoskeletal: Negative for myalgias.  Skin: Negative for rash.  Neurological: Negative for syncope, weakness and headaches.   Allergies  Codeine and Ibuprofen  Home Medications    Prior to Admission medications   Medication Sig Start Date End Date Taking? Authorizing Provider  ALPRAZolam (XANAX) 0.25 MG tablet Take 1 tablet (0.25 mg total) by mouth at bedtime as needed for sleep. 05/12/13  Yes Gavin PoundMichael Y. Ghim, MD  aspirin 81 MG tablet Take 81 mg by mouth daily.   Yes Historical Provider, MD  atenolol-chlorthalidone (TENORETIC) 100-25 MG per tablet Take 1 tablet by mouth daily.   Yes Historical Provider, MD  atorvastatin (LIPITOR) 10 MG tablet Take 1 tablet (10 mg total) by mouth daily at 6 PM. 06/27/12  Yes Mcarthur Rossettianiel J Angiulli, PA-C  cloNIDine (CATAPRES) 0.1 MG tablet Take 0.1 mg by mouth 2 (two) times daily.   Yes Historical Provider, MD  famotidine (PEPCID) 40 MG tablet Take 40 mg by mouth daily.   Yes Historical Provider, MD  glipiZIDE-metformin (METAGLIP) 5-500 MG per tablet Take 1 tablet by mouth at bedtime.   Yes Historical Provider, MD  hydrALAZINE (APRESOLINE) 50 MG tablet Take 50 mg by mouth 2 (two) times daily.    Yes Historical Provider, MD  losartan (COZAAR) 100 MG tablet Take 1 tablet (100 mg total) by mouth daily. 06/27/12  Yes Daniel J Angiulli, PA-C  sodium-potassium bicarbonate (ALKA-SELTZER GOLD) TBEF dissolvable tablet Take 2 tablets by mouth daily as needed (for indigestion).   Yes Historical Provider, MD   BP 202/59  Pulse 97  Temp(Src) 98.2 F (36.8 C) (Oral)  Resp 24  SpO2 100% Physical Exam  Nursing note and vitals reviewed. Constitutional: She is oriented to person, place, and time. She appears well-developed and well-nourished. No distress.  Tearful  HENT:  Head: Normocephalic and atraumatic.  Mouth/Throat: Oropharynx is clear and moist. No oropharyngeal exudate.  Eyes: Conjunctivae are normal. Pupils are equal, round, and reactive to light. No scleral icterus.  Neck: Normal range of motion. Neck supple. No thyromegaly present.  Cardiovascular: Regular rhythm, S1 normal, S2 normal and intact distal pulses.  Tachycardia present.  Exam reveals  no gallop and no friction rub.   No murmur heard. Pulses:      Radial pulses are 3+ on the right side, and 2+ on the left side.       Dorsalis pedis pulses are 2+ on the right side, and 2+ on the left side.    Pulmonary/Chest: Effort normal and breath sounds normal. No respiratory distress. She has no decreased breath sounds. She has no wheezes. She has no rhonchi. She has no rales. She exhibits no tenderness.  Abdominal: Soft. She exhibits no distension and no mass. There is no hepatosplenomegaly. There is no tenderness. There is no rigidity, no rebound, no guarding, no CVA tenderness, no tenderness at McBurney's point and negative Murphy's sign.  Musculoskeletal: She exhibits no tenderness.  Lymphadenopathy:    She has no cervical adenopathy.  Neurological: She is alert and oriented to person, place, and time. She has normal strength. No cranial nerve deficit or sensory deficit. Coordination normal. GCS eye subscore is 4. GCS verbal subscore is 5. GCS motor subscore is 6.  Skin: Skin is warm and dry. No rash noted. She is not diaphoretic.  Psychiatric: Her mood appears anxious.    ED Course  Procedures (including critical care time) Labs Review Labs Reviewed  CBC - Abnormal; Notable for the following:    WBC 12.4 (*)    All other components within normal limits  TROPONIN I - Abnormal; Notable for the following:    Troponin I 1.30 (*)    All other components within normal limits  BASIC METABOLIC PANEL - Abnormal; Notable for the following:    Glucose, Bld 115 (*)    GFR calc non Af Amer 62 (*)    GFR calc Af Amer 72 (*)    Anion gap 22 (*)    All other components within normal limits  D-DIMER, QUANTITATIVE - Abnormal; Notable for the following:    D-Dimer, Quant 1.22 (*)    All other components within normal limits  TROPONIN I - Abnormal; Notable for the following:    Troponin I 1.70 (*)    All other components within normal limits    Imaging Review Dg Chest 2  View  11/04/2013   CLINICAL DATA:  Left-sided chest pain x1 day .  EXAM: CHEST  2 VIEW  COMPARISON:  Chest x-ray 12/19/2012.  FINDINGS: Mediastinum and hilar structures normal. Lungs are clear. Heart size normal. No pleural effusion or pneumothorax. No acute bony abnormality identified. Degenerative changes thoracic spine and both shoulders.  IMPRESSION: No acute abnormality.   Electronically Signed   By: Maisie Fus  Register   On: 11/04/2013 21:14   Ct Angio Chest Pe W/cm &/or Wo Cm  11/05/2013   CLINICAL DATA:  Shortness of breath. Left-sided chest pressure and pain. Evaluate for pulmonary embolism. Initial encounter.  EXAM: CT ANGIOGRAPHY CHEST WITH CONTRAST  TECHNIQUE: Multidetector CT imaging of the chest was performed using the standard protocol during bolus administration of intravenous contrast. Multiplanar CT image reconstructions and  MIPs were obtained to evaluate the vascular anatomy.  CONTRAST:  OMNIPAQUE IOHEXOL 350 MG/ML SOLN  COMPARISON:  Chest radiograph - 11/04/2013  FINDINGS: Vascular Findings:  There is adequate opacification of the pulmonary arterial system with the main pulmonary artery measuring 286 Hounsfield units. No discrete filling defects are seen within the pulmonary arterial tree to the level of the bilateral subsegmental pulmonary arteries. Evaluation of distal subsegmental pulmonary arteries is degraded secondary to patient respiratory artifact. Normal caliber the main pulmonary artery.  Cardiomegaly.  No pericardial effusion.  Minimal calcifications within the aortic annulus. Scattered minimal atherosclerotic plaque within a normal caliber thoracic aorta. Bovine configuration of the aortic arch is incidentally noted. The branch vessels of the aortic arch widely patent throughout their imaged course. No thoracic aortic dissection or periaortic stranding.  Review of the MIP images confirms the above findings.    ----------------------------------------------------------------------------------  Nonvascular Findings:  Evaluation of the pulmonary parenchyma is minimally degraded secondary to patient respiratory artifact. There is minimal dependent subpleural ground-glass atelectasis. No discrete focal airspace opacities. No pleural effusion pneumothorax. No discrete pulmonary nodules. The central pulmonary airways appear widely patent.  Shotty mediastinal lymph nodes are individually not enlarged by size criteria with index precarinal lymph node measuring 0.9 cm in greatest short axis diameter. No mediastinal, hilar axillary lymphadenopathy.  Limited early arterial phase evaluation of the upper abdomen demonstrates punctate calcifications within the spleen, the sequela of prior granulomatous infection. Note is made of a punctate (approximately 0.6 cm) hypo attenuating lesion within the dome of the right lobe of the liver (image 70, series 4).  No acute or aggressive osseous abnormalities. Stigmata of DISH within the lower thoracic spine.  Regional soft tissues appear normal.  Review of the MIP images confirms the above findings.  IMPRESSION: 1. No explanation for patient's shortness of breath and left-sided chest pressure. Specifically, no evidence of pulmonary embolism. 2. Sequela of prior granulomatous infection as above. 3. Cardiomegaly. Further evaluation could be performed with cardiac echo as clinically indicated.   Electronically Signed   By: Simonne Come M.D.   On: 11/05/2013 00:21     EKG Interpretation   Date: 11/04/2013  Rate: 95  Rhythm: normal sinus rhythm  QRS Axis: normal  Intervals: normal  ST/T Wave abnormalities: nonspecific T wave changes  Conduction Disutrbances:none  Narrative Interpretation:   Old EKG Reviewed: inferior/lateral nonspecific T wave changes new from Jun 12 2012  MDM   Final diagnoses:  SOB (shortness of breath)  NSTEMI (non-ST elevated myocardial infarction)   68 yo  female presents with palpitations and complaints of all over pain but also some chest pain and shortness of breath.  Pt is tearful and hypertensive at initial exam.  Unclear if she is adherent to her home meds.    CBC, BMP, Troponin, D-Dimer, CXR, EKG  Consider MI, PE, or benign palpitations. Case discussed with Dr. Ethelda Chick.  Heart score: 6 prior to applying troponin result, based on history, age, EKG and risk factors.  Aspirin PO, Ativan IV, seemed to improve anxiety and bp decreased.  CBC: 12.4, BMP: anion gap: 22, CXR: T wave changes since last tracing  Troponin elevated, D-dimer elevated.  Will CT angio PE protocol  Plan to admit  On re-eval, pt appears less anxious, awaiting  CT scan.     11:53 PM Pt returned from CT, awaiting results, no distress currently  12:50AM consulted with Dr. Adolm Joseph (Cardiology), requested pt to be heparinized. Per pharmacy heparin protocol ordered.  1:23 AM At the end of shift, hand-off report to Jen Piepenbrink, PA-C.  [facility] after cardiology evaluates.      Filed Vitals:   11/04/13 2130 11/04/13 2200 11/04/13 2230 11/04/13 2300  BP: 185/69 164/56 161/59 157/80  Pulse: 101 96 93 99  Temp:      TempSrc:      Resp: 27 25 23 23   SpO2: 97% 100% 100% 94%   Meds given in ED:  Medications  morphine 4 MG/ML injection 4 mg (not administered)  LORazepam (ATIVAN) injection 1 mg (1 mg Intravenous Given 11/04/13 2126)  aspirin chewable tablet 324 mg (243 mg Oral Given 11/04/13 2124)  iohexol (OMNIPAQUE) 350 MG/ML injection 100 mL (100 mLs Intravenous Contrast Given 11/04/13 2332)    New Prescriptions   No medications on file        Harle BattiestElizabeth Azarya Oconnell, NP 11/05/13 1158

## 2013-11-04 NOTE — ED Notes (Signed)
Patient transported to CT 

## 2013-11-04 NOTE — ED Provider Notes (Signed)
Complains of anterior chest discomfort underneath the left breast onset 2 PM today accompanied by feeling of racing heart and numbness over the left side of her body. Nothing makes symptoms better or worse. Cardiac risk factors hypertension diabetes. On exam patient appears anxious lungs clear auscultation heart regular rate and rhythm abdomen nondistended nontender extremities without edema. Patient has heart score equals 4   Doug SouSam Nathanial Arrighi, MD 11/04/13 2116

## 2013-11-04 NOTE — ED Notes (Signed)
PA at bedside.

## 2013-11-05 ENCOUNTER — Encounter (HOSPITAL_COMMUNITY): Payer: Self-pay | Admitting: *Deleted

## 2013-11-05 ENCOUNTER — Encounter (HOSPITAL_COMMUNITY)
Admission: EM | Disposition: A | Discharge status: Home / Self Care | Payer: Self-pay | Source: Home / Self Care | Attending: Internal Medicine

## 2013-11-05 DIAGNOSIS — K5900 Constipation, unspecified: Secondary | ICD-10-CM | POA: Diagnosis present

## 2013-11-05 DIAGNOSIS — R0602 Shortness of breath: Secondary | ICD-10-CM | POA: Diagnosis present

## 2013-11-05 DIAGNOSIS — R0789 Other chest pain: Secondary | ICD-10-CM

## 2013-11-05 DIAGNOSIS — I10 Essential (primary) hypertension: Secondary | ICD-10-CM | POA: Diagnosis present

## 2013-11-05 DIAGNOSIS — I214 Non-ST elevation (NSTEMI) myocardial infarction: Secondary | ICD-10-CM | POA: Diagnosis present

## 2013-11-05 DIAGNOSIS — Z886 Allergy status to analgesic agent status: Secondary | ICD-10-CM | POA: Diagnosis not present

## 2013-11-05 DIAGNOSIS — E119 Type 2 diabetes mellitus without complications: Secondary | ICD-10-CM | POA: Diagnosis present

## 2013-11-05 DIAGNOSIS — Z885 Allergy status to narcotic agent status: Secondary | ICD-10-CM | POA: Diagnosis not present

## 2013-11-05 DIAGNOSIS — Z8673 Personal history of transient ischemic attack (TIA), and cerebral infarction without residual deficits: Secondary | ICD-10-CM | POA: Diagnosis not present

## 2013-11-05 DIAGNOSIS — Z7982 Long term (current) use of aspirin: Secondary | ICD-10-CM | POA: Diagnosis not present

## 2013-11-05 DIAGNOSIS — K219 Gastro-esophageal reflux disease without esophagitis: Secondary | ICD-10-CM | POA: Diagnosis present

## 2013-11-05 HISTORY — PX: LEFT HEART CATHETERIZATION WITH CORONARY ANGIOGRAM: SHX5451

## 2013-11-05 LAB — CBC
HCT: 35.1 % — ABNORMAL LOW (ref 36.0–46.0)
Hemoglobin: 11.6 g/dL — ABNORMAL LOW (ref 12.0–15.0)
MCH: 30.6 pg (ref 26.0–34.0)
MCHC: 33 g/dL (ref 30.0–36.0)
MCV: 92.6 fL (ref 78.0–100.0)
Platelets: 261 10*3/uL (ref 150–400)
RBC: 3.79 MIL/uL — ABNORMAL LOW (ref 3.87–5.11)
RDW: 13.5 % (ref 11.5–15.5)
WBC: 10.2 10*3/uL (ref 4.0–10.5)

## 2013-11-05 LAB — GLUCOSE, CAPILLARY
Glucose-Capillary: 130 mg/dL — ABNORMAL HIGH (ref 70–99)
Glucose-Capillary: 129 mg/dL — ABNORMAL HIGH (ref 70–99)
Glucose-Capillary: 114 mg/dL — ABNORMAL HIGH (ref 70–99)
Glucose-Capillary: 126 mg/dL — ABNORMAL HIGH (ref 70–99)

## 2013-11-05 LAB — PROTIME-INR
INR: 1.17 (ref 0.00–1.49)
Prothrombin Time: 15 seconds (ref 11.6–15.2)

## 2013-11-05 LAB — TROPONIN I
Troponin I: 1.7 ng/mL (ref <0.30)
Troponin I: 1.17 ng/mL (ref <0.30)
Troponin I: 0.8 ng/mL (ref <0.30)
Troponin I: 0.88 ng/mL (ref <0.30)

## 2013-11-05 LAB — BASIC METABOLIC PANEL
Anion gap: 16 — ABNORMAL HIGH (ref 5–15)
BUN: 7 mg/dL (ref 6–23)
CO2: 25 mEq/L (ref 19–32)
Calcium: 8.6 mg/dL (ref 8.4–10.5)
Chloride: 100 mEq/L (ref 96–112)
Creatinine, Ser: 0.96 mg/dL (ref 0.50–1.10)
GFR calc Af Amer: 70 mL/min — ABNORMAL LOW (ref >90)
GFR calc non Af Amer: 60 mL/min — ABNORMAL LOW (ref >90)
Glucose, Bld: 117 mg/dL — ABNORMAL HIGH (ref 70–99)
Potassium: 3.7 mEq/L (ref 3.7–5.3)
Sodium: 141 mEq/L (ref 137–147)

## 2013-11-05 LAB — I-STAT TROPONIN, ED: Troponin i, poc: 0.74 ng/mL (ref 0.00–0.08)

## 2013-11-05 LAB — HEMOGLOBIN A1C
Hgb A1c MFr Bld: 6.4 % — ABNORMAL HIGH (ref <5.7)
Mean Plasma Glucose: 137 mg/dL — ABNORMAL HIGH (ref <117)

## 2013-11-05 LAB — HEPARIN LEVEL (UNFRACTIONATED): Heparin Unfractionated: 0.62 IU/mL (ref 0.30–0.70)

## 2013-11-05 LAB — MRSA PCR SCREENING: MRSA by PCR: NEGATIVE

## 2013-11-05 SURGERY — LEFT HEART CATHETERIZATION WITH CORONARY ANGIOGRAM
Anesthesia: LOCAL

## 2013-11-05 MED ORDER — VERAPAMIL HCL 2.5 MG/ML IV SOLN
INTRAVENOUS | Status: AC
  Filled 2013-11-05: qty 2

## 2013-11-05 MED ORDER — FENTANYL CITRATE 0.05 MG/ML IJ SOLN
INTRAMUSCULAR | Status: AC
  Filled 2013-11-05: qty 2

## 2013-11-05 MED ORDER — INSULIN ASPART 100 UNIT/ML ~~LOC~~ SOLN
0.0000 [IU] | Freq: Three times a day (TID) | SUBCUTANEOUS | Status: DC
  Administered 2013-11-05: 14:00:00 2 [IU] via SUBCUTANEOUS

## 2013-11-05 MED ORDER — MORPHINE SULFATE 4 MG/ML IJ SOLN
4.0000 mg | Freq: Once | INTRAMUSCULAR | Status: AC
  Administered 2013-11-05: 4 mg via INTRAVENOUS
  Filled 2013-11-05: qty 1

## 2013-11-05 MED ORDER — SODIUM CHLORIDE 0.9 % IJ SOLN
3.0000 mL | Freq: Two times a day (BID) | INTRAMUSCULAR | Status: DC
  Administered 2013-11-05: 3 mL via INTRAVENOUS

## 2013-11-05 MED ORDER — ONDANSETRON HCL 4 MG/2ML IJ SOLN: 4.0000 mg | Freq: Four times a day (QID) | INTRAMUSCULAR | Status: DC | PRN

## 2013-11-05 MED ORDER — HEPARIN SODIUM (PORCINE) 1000 UNIT/ML IJ SOLN
INTRAMUSCULAR | Status: AC
  Filled 2013-11-05: qty 1

## 2013-11-05 MED ORDER — DIAZEPAM 5 MG PO TABS
5.0000 mg | ORAL_TABLET | ORAL | Status: AC
  Administered 2013-11-05: 5 mg via ORAL
  Filled 2013-11-05: qty 1

## 2013-11-05 MED ORDER — LOSARTAN POTASSIUM 50 MG PO TABS
100.0000 mg | ORAL_TABLET | Freq: Every day | ORAL | Status: DC
  Administered 2013-11-05 – 2013-11-06 (×2): 100 mg via ORAL
  Filled 2013-11-05 (×2): qty 2

## 2013-11-05 MED ORDER — POLYETHYLENE GLYCOL 3350 17 G PO PACK
17.0000 g | PACK | Freq: Every day | ORAL | Status: DC
  Administered 2013-11-06: 17 g via ORAL
  Filled 2013-11-05: qty 1

## 2013-11-05 MED ORDER — SODIUM CHLORIDE 0.9 % IV SOLN: 250.0000 mL | INTRAVENOUS | Status: DC | PRN

## 2013-11-05 MED ORDER — SODIUM CHLORIDE 0.9 % IJ SOLN: 3.0000 mL | INTRAMUSCULAR | Status: DC | PRN

## 2013-11-05 MED ORDER — HYDROCHLOROTHIAZIDE 12.5 MG PO CAPS
25.0000 mg | ORAL_CAPSULE | Freq: Every day | ORAL | Status: DC
  Administered 2013-11-05 – 2013-11-06 (×2): 25 mg via ORAL
  Filled 2013-11-05 (×2): qty 2

## 2013-11-05 MED ORDER — HYDRALAZINE HCL 50 MG PO TABS
50.0000 mg | ORAL_TABLET | Freq: Two times a day (BID) | ORAL | Status: DC
  Administered 2013-11-05 (×2): 50 mg via ORAL
  Filled 2013-11-05 (×5): qty 1

## 2013-11-05 MED ORDER — LIDOCAINE HCL (PF) 1 % IJ SOLN
INTRAMUSCULAR | Status: AC
  Filled 2013-11-05: qty 30

## 2013-11-05 MED ORDER — ACETAMINOPHEN 325 MG PO TABS
650.0000 mg | ORAL_TABLET | ORAL | Status: DC | PRN
  Administered 2013-11-05: 650 mg via ORAL
  Filled 2013-11-05: qty 2

## 2013-11-05 MED ORDER — ALPRAZOLAM 0.25 MG PO TABS
0.2500 mg | ORAL_TABLET | Freq: Every evening | ORAL | Status: DC | PRN
  Administered 2013-11-05: 0.25 mg via ORAL
  Filled 2013-11-05: qty 1

## 2013-11-05 MED ORDER — NITROGLYCERIN 0.4 MG SL SUBL: 0.4000 mg | SUBLINGUAL_TABLET | SUBLINGUAL | Status: DC | PRN

## 2013-11-05 MED ORDER — FAMOTIDINE 40 MG PO TABS
40.0000 mg | ORAL_TABLET | Freq: Every day | ORAL | Status: DC
  Administered 2013-11-05 – 2013-11-06 (×2): 40 mg via ORAL
  Filled 2013-11-05 (×2): qty 1

## 2013-11-05 MED ORDER — ASPIRIN EC 81 MG PO TBEC
81.0000 mg | DELAYED_RELEASE_TABLET | Freq: Every day | ORAL | Status: DC
  Administered 2013-11-06: 81 mg via ORAL
  Filled 2013-11-05: qty 1

## 2013-11-05 MED ORDER — CLONIDINE HCL 0.1 MG PO TABS
0.1000 mg | ORAL_TABLET | Freq: Two times a day (BID) | ORAL | Status: DC
  Administered 2013-11-05 – 2013-11-06 (×3): 0.1 mg via ORAL
  Filled 2013-11-05 (×5): qty 1

## 2013-11-05 MED ORDER — METOPROLOL TARTRATE 100 MG PO TABS
100.0000 mg | ORAL_TABLET | Freq: Two times a day (BID) | ORAL | Status: DC
  Administered 2013-11-05 – 2013-11-06 (×4): 100 mg via ORAL
  Filled 2013-11-05: qty 1
  Filled 2013-11-05: qty 4
  Filled 2013-11-05 (×4): qty 1

## 2013-11-05 MED ORDER — ASPIRIN 81 MG PO CHEW
81.0000 mg | CHEWABLE_TABLET | ORAL | Status: AC
  Administered 2013-11-05: 81 mg via ORAL
  Filled 2013-11-05: qty 1

## 2013-11-05 MED ORDER — NITROGLYCERIN 1 MG/10 ML FOR IR/CATH LAB
INTRA_ARTERIAL | Status: AC
  Filled 2013-11-05: qty 10

## 2013-11-05 MED ORDER — HEPARIN (PORCINE) IN NACL 2-0.9 UNIT/ML-% IJ SOLN
INTRAMUSCULAR | Status: AC
  Filled 2013-11-05: qty 1500

## 2013-11-05 MED ORDER — MIDAZOLAM HCL 2 MG/2ML IJ SOLN
INTRAMUSCULAR | Status: AC
  Filled 2013-11-05: qty 2

## 2013-11-05 MED ORDER — HEPARIN (PORCINE) IN NACL 100-0.45 UNIT/ML-% IJ SOLN
900.0000 [IU]/h | INTRAMUSCULAR | Status: DC
  Administered 2013-11-05: 900 [IU]/h via INTRAVENOUS
  Filled 2013-11-05 (×2): qty 250

## 2013-11-05 MED ORDER — HEPARIN BOLUS VIA INFUSION
3000.0000 [IU] | Freq: Once | INTRAVENOUS | Status: AC
  Administered 2013-11-05: 3000 [IU] via INTRAVENOUS
  Filled 2013-11-05: qty 3000

## 2013-11-05 MED ORDER — PNEUMOCOCCAL VAC POLYVALENT 25 MCG/0.5ML IJ INJ
0.5000 mL | INJECTION | INTRAMUSCULAR | Status: AC
  Administered 2013-11-06: 0.5 mL via INTRAMUSCULAR
  Filled 2013-11-05: qty 0.5

## 2013-11-05 MED ORDER — ACETAMINOPHEN 325 MG PO TABS
650.0000 mg | ORAL_TABLET | ORAL | Status: DC | PRN
  Filled 2013-11-05: qty 2

## 2013-11-05 MED ORDER — SODIUM CHLORIDE 0.9 % IV SOLN
INTRAVENOUS | Status: DC
Start: 2013-11-05 — End: 2013-11-06

## 2013-11-05 MED ORDER — DOCUSATE SODIUM 100 MG PO CAPS
100.0000 mg | ORAL_CAPSULE | Freq: Two times a day (BID) | ORAL | Status: DC
  Administered 2013-11-05 – 2013-11-06 (×2): 100 mg via ORAL
  Filled 2013-11-05 (×5): qty 1

## 2013-11-05 MED ORDER — NITROGLYCERIN 2 % TD OINT
1.0000 [in_us] | TOPICAL_OINTMENT | Freq: Four times a day (QID) | TRANSDERMAL | Status: DC
  Administered 2013-11-05 – 2013-11-06 (×2): 1 [in_us] via TOPICAL
  Filled 2013-11-05 (×2): qty 30

## 2013-11-05 MED ORDER — ASPIRIN EC 81 MG PO TBEC
81.0000 mg | DELAYED_RELEASE_TABLET | Freq: Every day | ORAL | Status: DC
  Filled 2013-11-05: qty 1

## 2013-11-05 MED ORDER — ATORVASTATIN CALCIUM 80 MG PO TABS
80.0000 mg | ORAL_TABLET | Freq: Every day | ORAL | Status: DC
  Administered 2013-11-05: 80 mg via ORAL
  Filled 2013-11-05 (×3): qty 1

## 2013-11-05 NOTE — H&P (Signed)
History and Physical  Patient ID: [name] MRN: [id number], SOB: 1945-12-18 68 y.o. Date of Encounter: 11/05/2013, 3:04 AM  Primary Physician: No PCP Per Patient Primary Cardiologist: unassigned  Chief Complaint: heart beating hard  HPI: 68 y.o. female w/ PMHx significant for HTN requiring multiple medications, CVA, DM2 who [facility] with complaints of feeling her heart beat hard. Was watching TV at the time. Mildly short of breath with this. Also hurts all over. Unable to describe this further. No diaphoresis. No fevers or chills. Endorses chronic constipation and is currently constipated causing her signifcant distress. Blood pressure is also significantly higher than normal. Has not taken PM meds.   The heart beating hard improved in the ER.   EKG revealed NSR with deiffuse T wave flattening, more promient than prior EKG. CXR was without acute cardiopulmonary abnormalities. Underwent CT PE protocol which was negative for PE. Labs are significant for elevated troponin at 1.3.    Past Medical History  Diagnosis Date  . Diabetes mellitus without complication   . Hypertension   . Stroke      Surgical History:  Past Surgical History  Procedure Laterality Date  . Abdominal hysterectomy       Home Meds: Prior to Admission medications   Medication Sig Start Date End Date Taking? Authorizing Provider  ALPRAZolam (XANAX) 0.25 MG tablet Take 1 tablet (0.25 mg total) by mouth at bedtime as needed for sleep. 05/12/13  Yes Gavin PoundMichael Y. Ghim, MD  aspirin 81 MG tablet Take 81 mg by mouth daily.   Yes Historical Provider, MD  atenolol-chlorthalidone (TENORETIC) 100-25 MG per tablet Take 1 tablet by mouth daily.   Yes Historical Provider, MD  atorvastatin (LIPITOR) 10 MG tablet Take 1 tablet (10 mg total) by mouth daily at 6 PM. 06/27/12  Yes Mcarthur Rossettianiel J Angiulli, PA-C  cloNIDine (CATAPRES) 0.1 MG tablet Take 0.1 mg by mouth 2 (two) times daily.   Yes  Historical Provider, MD  famotidine (PEPCID) 40 MG tablet Take 40 mg by mouth daily.   Yes Historical Provider, MD  glipiZIDE-metformin (METAGLIP) 5-500 MG per tablet Take 1 tablet by mouth at bedtime.   Yes Historical Provider, MD  hydrALAZINE (APRESOLINE) 50 MG tablet Take 50 mg by mouth 2 (two) times daily.    Yes Historical Provider, MD  losartan (COZAAR) 100 MG tablet Take 1 tablet (100 mg total) by mouth daily. 06/27/12  Yes Daniel J Angiulli, PA-C  sodium-potassium bicarbonate (ALKA-SELTZER GOLD) TBEF dissolvable tablet Take 2 tablets by mouth daily as needed (for indigestion).   Yes Historical Provider, MD    Allergies:  Allergies  Allergen Reactions  . Codeine Other (See Comments)    REACTION: Hallucinations  . Ibuprofen Other (See Comments)    REACTION: Elevated Blood Pressure    History   Social History  . Marital Status: Widowed    Spouse Name: N/A    Number of Children: N/A  . Years of Education: N/A   Occupational History  . Not on file.   Social History Main Topics  . Smoking status: Never Smoker   . Smokeless tobacco: Not on file  . Alcohol Use: No  . Drug Use: No  . Sexual Activity: Not on file   Other Topics Concern  . Not on file   Social History Narrative  . No narrative on file     No family history on file.  Review of Systems: General: negative for chills, fever, night sweats or  weight changes.  Cardiovascular: see HPI Dermatological: negative for rash Respiratory: negative for cough or wheezing Urologic: negative for hematuria Abdominal: negative for nausea, vomiting, diarrhea, bright red blood per rectum, melena, or hematemesis. +Constipation Neurologic: negative for visual changes, syncope, or dizziness All other systems reviewed and are otherwise negative except as noted above.  Labs:   Lab Results  Component Value Date   WBC 12.4* 11/04/2013   HGB 12.3 11/04/2013   HCT 37.2 11/04/2013   MCV 91.6 11/04/2013   PLT 276 11/04/2013     Recent Labs Lab 11/04/13 2014  NA 141  K 3.9  CL 98  CO2 21  BUN 8  CREATININE 0.94  CALCIUM 9.5  GLUCOSE 115*    Recent Labs  11/04/13 2014  TROPONINI 1.30*   Lab Results  Component Value Date   CHOL 189 06/21/2012   HDL 68 06/21/2012   LDLCALC 100* 06/21/2012   TRIG 107 06/21/2012   Lab Results  Component Value Date   DDIMER 1.22* 11/04/2013    Radiology/Studies:  Dg Chest 2 View  11/04/2013   CLINICAL DATA:  Left-sided chest pain x1 day .  EXAM: CHEST  2 VIEW  COMPARISON:  Chest x-ray 12/19/2012.  FINDINGS: Mediastinum and hilar structures normal. Lungs are clear. Heart size normal. No pleural effusion or pneumothorax. No acute bony abnormality identified. Degenerative changes thoracic spine and both shoulders.  IMPRESSION: No acute abnormality.   Electronically Signed   By: Maisie Fushomas  Register   On: 11/04/2013 21:14   Ct Angio Chest Pe W/cm &/or Wo Cm  11/05/2013   CLINICAL DATA:  Shortness of breath. Left-sided chest pressure and pain. Evaluate for pulmonary embolism. Initial encounter.  EXAM: CT ANGIOGRAPHY CHEST WITH CONTRAST  TECHNIQUE: Multidetector CT imaging of the chest was performed using the standard protocol during bolus administration of intravenous contrast. Multiplanar CT image reconstructions and MIPs were obtained to evaluate the vascular anatomy.  CONTRAST:  100mL OMNIPAQUE IOHEXOL 350 MG/ML SOLN  COMPARISON:  Chest radiograph - 11/04/2013  FINDINGS: Vascular Findings:  There is adequate opacification of the pulmonary arterial system with the main pulmonary artery measuring 286 Hounsfield units. No discrete filling defects are seen within the pulmonary arterial tree to the level of the bilateral subsegmental pulmonary arteries. Evaluation of distal subsegmental pulmonary arteries is degraded secondary to patient respiratory artifact. Normal caliber the main pulmonary artery.  Cardiomegaly.  No pericardial effusion.  Minimal calcifications within the aortic  annulus. Scattered minimal atherosclerotic plaque within a normal caliber thoracic aorta. Bovine configuration of the aortic arch is incidentally noted. The branch vessels of the aortic arch widely patent throughout their imaged course. No thoracic aortic dissection or periaortic stranding.  Review of the MIP images confirms the above findings.   ----------------------------------------------------------------------------------  Nonvascular Findings:  Evaluation of the pulmonary parenchyma is minimally degraded secondary to patient respiratory artifact. There is minimal dependent subpleural ground-glass atelectasis. No discrete focal airspace opacities. No pleural effusion pneumothorax. No discrete pulmonary nodules. The central pulmonary airways appear widely patent.  Shotty mediastinal lymph nodes are individually not enlarged by size criteria with index precarinal lymph node measuring 0.9 cm in greatest short axis diameter. No mediastinal, hilar axillary lymphadenopathy.  Limited early arterial phase evaluation of the upper abdomen demonstrates punctate calcifications within the spleen, the sequela of prior granulomatous infection. Note is made of a punctate (approximately 0.6 cm) hypo attenuating lesion within the dome of the right lobe of the liver (image 70, series 4).  No acute or  aggressive osseous abnormalities. Stigmata of DISH within the lower thoracic spine.  Regional soft tissues appear normal.  Review of the MIP images confirms the above findings.  IMPRESSION: 1. No explanation for patient's shortness of breath and left-sided chest pressure. Specifically, no evidence of pulmonary embolism. 2. Sequela of prior granulomatous infection as above. 3. Cardiomegaly. Further evaluation could be performed with cardiac echo as clinically indicated.   Electronically Signed   By: Simonne Come M.D.   On: 11/05/2013 00:21     EKG: see HPI  Physical Exam: Blood pressure 173/68, pulse 85, temperature 98.2 F  (36.8 C), temperature source Oral, resp. rate 23, SpO2 97.00%. General: Well developed, well nourished, . Mildly distressed. Head: Normocephalic, atraumatic, sclera non-icteric, nares are without discharge Neck: Supple. Negative for carotid bruits. JVD not elevated. Lungs: Clear bilaterally to auscultation without wheezes, rales, or rhonchi. Breathing is unlabored. Heart: RRR with S1 S2. No murmurs, rubs, or gallops appreciated. Abdomen: Soft, non-tender, non-distended with normoactive bowel sounds. No rebound/guarding. No obvious abdominal masses. Msk:  Strength and tone appear normal for age. Extremities: No edema. No clubbing or cyanosis. Distal pedal pulses are 2+ and equal bilaterally. Neuro: Alert and oriented X 3.  Psych:  Responds to questions appropriately. Distressed.    ASSESSMENT AND PLAN:  Problem List 1. NSTEMI 2. Hypertension, poorly controlled due to missed meds. 3. H/o CVA 4. DM2 5. Chronic constipation  68 y.o. female w/ PMHx significant for HTN requiring multiple medications, CVA, DM2 who presented to [facility] on 11/05/2013 with complaints of feeling her heart beat hard --> elevated troponin c/w NSTEMI.  Will admit and plan for early invasive strategy with cardiac cath to investigate anatomy this morning. Continue aspirin. Intensify statin. Continue multiple blood pressure agents to improve her double product including BB, HCTZ, clonidine, ARB, hydralazine.  Regarding constipation, will start miralax in addition to colace.   Hold diabetes meds in setting of being NPO and needing cath. Cover with sliding scale insulin.  Full code. Heparinized. NPO for procedure.    Signed, Jourdin Connors C. MD 11/05/2013, 3:04 AM

## 2013-11-05 NOTE — Care Management Note (Signed)
    Page 1 of 1   11/05/2013     8:50:34 AM CARE MANAGEMENT NOTE 11/05/2013  Patient:  [name],[name]   Account Number:  [id number]  Date Initiated:  11/05/2013  Documentation initiated by:  Junius CreamerWELL,DEBBIE  Subjective/Objective Assessment:   adm w mi     Action/Plan:   lives at home   Anticipated DC Date:     Anticipated DC Plan:           Choice offered to / List presented to:             Status of service:   Medicare Important Message given?   (If response is "NO", the following Medicare IM given date fields will be blank) Date Medicare IM given:   Medicare IM given by:   Date Additional Medicare IM given:   Additional Medicare IM given by:    Discharge Disposition:    Per UR Regulation:  Reviewed for med. necessity/level of care/duration of stay  If discussed at Long Length of Stay Meetings, dates discussed:    Comments:

## 2013-11-05 NOTE — Progress Notes (Signed)
TR BAND REMOVAL  LOCATION:  right radial  DEFLATED PER PROTOCOL:  Yes.    TIME BAND OFF / DRESSING APPLIED:   1615   SITE UPON ARRIVAL:   Level 0  SITE AFTER BAND REMOVAL:  Level 0  REVERSE ALLEN'S TEST:    positive  CIRCULATION SENSATION AND MOVEMENT:  Within Normal Limits  Yes.    COMMENTS:    

## 2013-11-05 NOTE — CV Procedure (Signed)
[name] is a 68 y.o. female   [id number]  [id number] LOCATION:  FACILITY: [facility]  PHYSICIAN: Lennette Biharihomas A. Kelly, MD, [facility] 19-Aug-1945   DATE OF PROCEDURE:  11/05/2013     CARDIAC CATHETERIZATION    HISTORY:   Ms. Adriana JumboWilma Cooler is a 68 year old, African American female, who has a history of hypertension, requiring multiple medications, type 2 diabetes mellitus, as well as remote CVA.  She presented to the hospital.  This morning with complaints of palpitations associated with increasing shortness of breath.  Her ECG revealed diffuse T wave flattening.  Cardiac CT was negative for pulmonary embolism.  She did have mildly positive troponin.  Definitive cardiac catheterization is now recommended.   PROCEDURE:  Left heart catheterization via the right radial artery approach: Coronary angiography, left ventriculography.  The patient was brought to the second floor [place] [facility] cath lab in the postabsorptive state. The patient was premedicated with Versed 2 mg and fentanyl 50 mcg. A right radial approach was utilized after an Allen's test verified adequate circulation. The right radial artery was punctured via the Seldinger technique, and a 6 [place] Glidesheath Slender was inserted without difficulty.  A radial cocktail consisting of Verapamil, IV nitroglycerin, and lidocaine was administered. Weight adjusted heparin was administered. A safety J wire was advanced into the ascending aorta. Diagnostic catheterization was done with 5 [place] TIG 4.0 catheter. A 5 French pigtail catheter was used for left ventriculography. A TR radial band was applied for hemostasis. The patient left the catheterization laboratory in stable condition.   HEMODYNAMICS:   Central Aorta: 115/52  Left Ventricle: 115/13  ANGIOGRAPHY:   The Left Main coronary artery was angiographically normal and trifurcated into the LAD, a small ramus intermediate,  and the left circumflex coronary artery.    The LAD was  angiographically normal and gave rise to a proximal diagonal vessel and several septal perforating arteries.  The midportion bifurcated into a twin like LAD-diagonal system, which extended to but did not reach the apex.  Ramus intermediate vessel was a small caliber normal vessel  The left circumflex coronary artery was angiographically normal and gave rise to one major bifurcating obtuse marginal branch.   The RCA was angiographically normal it gave rise to a large PDA and PLA vessel.   Left ventriculography revealed normal global LV contractility without focal segmental wall motion abnormalities. There was no evidence for mitral regurgitation.  There is a "catenoid "appearance to ventricular contraction.  Ejection fraction is 65%.   Total contrast used: 65 cc Omnipaque  IMPRESSION:  Normal LV function.  Normal coronary arteries.    Lennette Biharihomas A. Kelly, MD, [facility] CenterFACC 11/05/2013 12:07 PM

## 2013-11-05 NOTE — Progress Notes (Signed)
ANTICOAGULATION CONSULT NOTE - Initial Consult  Pharmacy Consult for heparin Indication: NSTEMI  Allergies  Allergen Reactions  . Codeine Other (See Comments)    REACTION: Hallucinations  . Ibuprofen Other (See Comments)    REACTION: Elevated Blood Pressure    Patient Measurements: Heparin Dosing Weight: 65kg  Vital Signs: Temp: 98.2 F (36.8 C) (10/14 1809) Temp Source: Oral (10/14 1809) BP: 169/59 mmHg (10/15 0030) Pulse Rate: 94 (10/15 0030)  Labs:  Recent Labs  11/04/13 2014  HGB 12.3  HCT 37.2  PLT 276  CREATININE 0.94  TROPONINI 1.30*    Medical History: Past Medical History  Diagnosis Date  . Diabetes mellitus without complication   . Hypertension   . Stroke      Assessment: 68yo female c/o heart palpitations, reports CP 10/10 but states "it only hurts a little", regular HR, troponin elevated, to begin heparin for NSTEMI.  Goal of Therapy:  Heparin level 0.3-0.7 units/ml Monitor platelets by anticoagulation protocol: Yes   Plan:  Will give heparin 3000 units IV bolus x1 followed by gtt at 900 units/hr and monitor heparin levels and CBC.  Vernard GamblesVeronda Delise Simenson, PharmD, BCPS  11/05/2013,1:02 AM

## 2013-11-05 NOTE — H&P (View-Only) (Signed)
Patient Name: [name] Date of Encounter: 11/05/2013     Active Problems:   NSTEMI (non-ST elevated myocardial infarction)    SUBJECTIVE  No chest pain this am. Complains of constipation and abdominal fullness. Enzymes rising consistent with NSTEMI. EKG shows no ischemic changes.  CURRENT MEDS . aspirin EC  81 mg Oral Daily  . atorvastatin  80 mg Oral q1800  . cloNIDine  0.1 mg Oral BID  . docusate sodium  100 mg Oral BID  . famotidine  40 mg Oral Daily  . hydrALAZINE  50 mg Oral BID  . hydrochlorothiazide  25 mg Oral Daily  . insulin aspart  0-15 Units Subcutaneous TID WC  . losartan  100 mg Oral Daily  . metoprolol tartrate  100 mg Oral BID  . nitroGLYCERIN  1 inch Topical 4 times per day  . [START ON 11/06/2013] pneumococcal 23 valent vaccine  0.5 mL Intramuscular Tomorrow-1000  . polyethylene glycol  17 g Oral Daily    OBJECTIVE  Filed Vitals:   11/05/13 0600 11/05/13 0630 11/05/13 0700 11/05/13 0800  BP: 174/62 163/72 178/69 171/66  Pulse: 66 67 68 70  Temp:   98.8 F (37.1 C)   TempSrc:   Oral   Resp: 15 17 16 18   Height:      Weight:      SpO2: 98% 97% 97% 99%    Intake/Output Summary (Last 24 hours) at 11/05/13 0852 Last data filed at 11/05/13 0600  Gross per 24 hour  Intake     18 ml  Output      0 ml  Net     18 ml   Filed Weights   11/05/13 0541  Weight: 152 lb 1.9 oz (69 kg)    PHYSICAL EXAM  General: Pleasant, NAD. Neuro: Alert and oriented X 3. Moves all extremities spontaneously. Psych: Normal affect. HEENT:  Normal  Neck: Supple without bruits or JVD. Lungs:  Resp regular and unlabored, CTA. Heart: RRR no s3, s4, or murmurs. Abdomen: Soft, non-tender, non-distended, BS + x 4. Active bowel sounds. Extremities: No clubbing, cyanosis or edema. DP/PT/Radials 2+ and equal bilaterally.  Accessory Clinical Findings  CBC  Recent Labs  11/04/13 2014 11/05/13 0357  WBC 12.4* 10.2  HGB 12.3 11.6*  HCT 37.2 35.1*  MCV 91.6  92.6  PLT 276 261   Basic Metabolic Panel  Recent Labs  11/04/13 2014 11/05/13 0357  NA 141 141  K 3.9 3.7  CL 98 100  CO2 21 25  GLUCOSE 115* 117*  BUN 8 7  CREATININE 0.94 0.96  CALCIUM 9.5 8.6   Liver Function Tests No results found for this basename: AST, ALT, ALKPHOS, BILITOT, PROT, ALBUMIN,  in the last 72 hours No results found for this basename: LIPASE, AMYLASE,  in the last 72 hours Cardiac Enzymes  Recent Labs  11/04/13 2014 11/05/13 0357  TROPONINI 1.30* 1.70*   BNP No components found with this basename: POCBNP,  D-Dimer  Recent Labs  11/04/13 2026  DDIMER 1.22*   Hemoglobin A1C No results found for this basename: HGBA1C,  in the last 72 hours Fasting Lipid Panel No results found for this basename: CHOL, HDL, LDLCALC, TRIG, CHOLHDL, LDLDIRECT,  in the last 72 hours Thyroid Function Tests No results found for this basename: TSH, T4TOTAL, FREET3, T3FREE, THYROIDAB,  in the last 72 hours  TELE  NSR  ECG  NSR. No ischemic changes.  Radiology/Studies  Dg Chest 2 View  11/04/2013  CLINICAL DATA:  Left-sided chest pain x1 day .  EXAM: CHEST  2 VIEW  COMPARISON:  Chest x-ray 12/19/2012.  FINDINGS: Mediastinum and hilar structures normal. Lungs are clear. Heart size normal. No pleural effusion or pneumothorax. No acute bony abnormality identified. Degenerative changes thoracic spine and both shoulders.  IMPRESSION: No acute abnormality.   Electronically Signed   By: Maisie Fushomas  Register   On: 11/04/2013 21:14   Ct Angio Chest Pe W/cm &/or Wo Cm  11/05/2013   CLINICAL DATA:  Shortness of breath. Left-sided chest pressure and pain. Evaluate for pulmonary embolism. Initial encounter.  EXAM: CT ANGIOGRAPHY CHEST WITH CONTRAST  TECHNIQUE: Multidetector CT imaging of the chest was performed using the standard protocol during bolus administration of intravenous contrast. Multiplanar CT image reconstructions and MIPs were obtained to evaluate the vascular anatomy.   CONTRAST:  100mL OMNIPAQUE IOHEXOL 350 MG/ML SOLN  COMPARISON:  Chest radiograph - 11/04/2013  FINDINGS: Vascular Findings:  There is adequate opacification of the pulmonary arterial system with the main pulmonary artery measuring 286 Hounsfield units. No discrete filling defects are seen within the pulmonary arterial tree to the level of the bilateral subsegmental pulmonary arteries. Evaluation of distal subsegmental pulmonary arteries is degraded secondary to patient respiratory artifact. Normal caliber the main pulmonary artery.  Cardiomegaly.  No pericardial effusion.  Minimal calcifications within the aortic annulus. Scattered minimal atherosclerotic plaque within a normal caliber thoracic aorta. Bovine configuration of the aortic arch is incidentally noted. The branch vessels of the aortic arch widely patent throughout their imaged course. No thoracic aortic dissection or periaortic stranding.  Review of the MIP images confirms the above findings.   ----------------------------------------------------------------------------------  Nonvascular Findings:  Evaluation of the pulmonary parenchyma is minimally degraded secondary to patient respiratory artifact. There is minimal dependent subpleural ground-glass atelectasis. No discrete focal airspace opacities. No pleural effusion pneumothorax. No discrete pulmonary nodules. The central pulmonary airways appear widely patent.  Shotty mediastinal lymph nodes are individually not enlarged by size criteria with index precarinal lymph node measuring 0.9 cm in greatest short axis diameter. No mediastinal, hilar axillary lymphadenopathy.  Limited early arterial phase evaluation of the upper abdomen demonstrates punctate calcifications within the spleen, the sequela of prior granulomatous infection. Note is made of a punctate (approximately 0.6 cm) hypo attenuating lesion within the dome of the right lobe of the liver (image 70, series 4).  No acute or aggressive osseous  abnormalities. Stigmata of DISH within the lower thoracic spine.  Regional soft tissues appear normal.  Review of the MIP images confirms the above findings.  IMPRESSION: 1. No explanation for patient's shortness of breath and left-sided chest pressure. Specifically, no evidence of pulmonary embolism. 2. Sequela of prior granulomatous infection as above. 3. Cardiomegaly. Further evaluation could be performed with cardiac echo as clinically indicated.   Electronically Signed   By: Simonne ComeJohn  Watts M.D.   On: 11/05/2013 00:21    ASSESSMENT AND PLAN Problem List  1. NSTEMI  2. Hypertension, poorly controlled due to missed meds.  3. H/o CVA  4. DM2  5. Chronic constipation  Plan: Left heart cath today. She is anxious about being awake during cath. Will give valium 5mg  on call.The risks and benefits of a cardiac catheterization including, but not limited to, death, stroke, MI, kidney damage and bleeding were discussed with the patient who indicates understanding and agrees to proceed.   Signed, Cassell Clementhomas Josuha Fontanez MD

## 2013-11-05 NOTE — Progress Notes (Signed)
ANTICOAGULATION CONSULT NOTE - Follow Up Consult  Pharmacy Consult for Heparin Indication: chest pain/ACS  Allergies  Allergen Reactions  . Codeine Other (See Comments)    REACTION: Hallucinations  . Ibuprofen Other (See Comments)    REACTION: Elevated Blood Pressure    Patient Measurements: Height: 5\' 5"  (165.1 cm) Weight: 152 lb 1.9 oz (69 kg) IBW/kg (Calculated) : 57 Heparin Dosing Weight: 69 kg  Labs:  Recent Labs  11/04/13 2014 11/05/13 0357 11/05/13 0814  HGB 12.3 11.6*  --   HCT 37.2 35.1*  --   PLT 276 261  --   HEPARINUNFRC  --   --  0.62  CREATININE 0.94 0.96  --   TROPONINI 1.30* 1.70*  --     Estimated Creatinine Clearance: 56.2 ml/min (by C-G formula based on Cr of 0.96).   Medications:  Infusions:  Heparin running at 900 units/hr  Assessment: 68 yo female on IV heparin for NSTEMI.  Initial heparin level is at goal.  No bleeding or complications noted.  Goal of Therapy:  Heparin level 0.3-0.7 units/ml Monitor platelets by anticoagulation protocol: Yes   Plan:  1. Continue IV heparin at current rate. 2. Will recheck heparin level in 6 hrs to confirm. 3. Continue daily heparin level and CBC. 4. F/u plans for cath lab this afternoon.  Tad MooreJessica Con Arganbright, Pharm D, BCPS  Clinical Pharmacist Pager [phone]  11/05/2013 9:50 AM

## 2013-11-05 NOTE — ED Provider Notes (Signed)
Medical screening examination/treatment/procedure(s) were conducted as a shared visit with non-physician practitioner(s) and myself.  I personally evaluated the patient during the encounter.   EKG Interpretation None       Izaiah Tabb, MD 11/05/13 2309 

## 2013-11-05 NOTE — Interval H&P Note (Signed)
Cath Lab Visit (complete for each Cath Lab visit)  Clinical Evaluation Leading to the Procedure:   ACS: Yes.    Non-ACS:    Anginal Classification: CCS IV  Anti-ischemic medical therapy: Maximal Therapy (2 or more classes of medications)  Non-Invasive Test Results: No non-invasive testing performed  Prior CABG: No previous CABG      History and Physical Interval Note:  11/05/2013 11:26 AM  [name]  has presented today for surgery, with the diagnosis of non stemi  The various methods of treatment have been discussed with the patient and family. After consideration of risks, benefits and other options for treatment, the patient has consented to  Procedure(s): LEFT HEART CATHETERIZATION WITH CORONARY ANGIOGRAM (N/A) as a surgical intervention .  The patient's history has been reviewed, patient examined, no change in status, stable for surgery.  I have reviewed the patient's chart and labs.  Questions were answered to the patient's satisfaction.     Chaquetta Schlottman A

## 2013-11-05 NOTE — Progress Notes (Signed)
Patient Name: [name] Date of Encounter: 11/05/2013     Active Problems:   NSTEMI (non-ST elevated myocardial infarction)    SUBJECTIVE  No chest pain this am. Complains of constipation and abdominal fullness. Enzymes rising consistent with NSTEMI. EKG shows no ischemic changes.  CURRENT MEDS . aspirin EC  81 mg Oral Daily  . atorvastatin  80 mg Oral q1800  . cloNIDine  0.1 mg Oral BID  . docusate sodium  100 mg Oral BID  . famotidine  40 mg Oral Daily  . hydrALAZINE  50 mg Oral BID  . hydrochlorothiazide  25 mg Oral Daily  . insulin aspart  0-15 Units Subcutaneous TID WC  . losartan  100 mg Oral Daily  . metoprolol tartrate  100 mg Oral BID  . nitroGLYCERIN  1 inch Topical 4 times per day  . [START ON 11/06/2013] pneumococcal 23 valent vaccine  0.5 mL Intramuscular Tomorrow-1000  . polyethylene glycol  17 g Oral Daily    OBJECTIVE  Filed Vitals:   11/05/13 0600 11/05/13 0630 11/05/13 0700 11/05/13 0800  BP: 174/62 163/72 178/69 171/66  Pulse: 66 67 68 70  Temp:   98.8 F (37.1 C)   TempSrc:   Oral   Resp: 15 17 16 18   Height:      Weight:      SpO2: 98% 97% 97% 99%    Intake/Output Summary (Last 24 hours) at 11/05/13 0852 Last data filed at 11/05/13 0600  Gross per 24 hour  Intake     18 ml  Output      0 ml  Net     18 ml   Filed Weights   11/05/13 0541  Weight: 152 lb 1.9 oz (69 kg)    PHYSICAL EXAM  General: Pleasant, NAD. Neuro: Alert and oriented X 3. Moves all extremities spontaneously. Psych: Normal affect. HEENT:  Normal  Neck: Supple without bruits or JVD. Lungs:  Resp regular and unlabored, CTA. Heart: RRR no s3, s4, or murmurs. Abdomen: Soft, non-tender, non-distended, BS + x 4. Active bowel sounds. Extremities: No clubbing, cyanosis or edema. DP/PT/Radials 2+ and equal bilaterally.  Accessory Clinical Findings  CBC  Recent Labs  11/04/13 2014 11/05/13 0357  WBC 12.4* 10.2  HGB 12.3 11.6*  HCT 37.2 35.1*  MCV 91.6  92.6  PLT 276 261   Basic Metabolic Panel  Recent Labs  11/04/13 2014 11/05/13 0357  NA 141 141  K 3.9 3.7  CL 98 100  CO2 21 25  GLUCOSE 115* 117*  BUN 8 7  CREATININE 0.94 0.96  CALCIUM 9.5 8.6   Liver Function Tests No results found for this basename: AST, ALT, ALKPHOS, BILITOT, PROT, ALBUMIN,  in the last 72 hours No results found for this basename: LIPASE, AMYLASE,  in the last 72 hours Cardiac Enzymes  Recent Labs  11/04/13 2014 11/05/13 0357  TROPONINI 1.30* 1.70*   BNP No components found with this basename: POCBNP,  D-Dimer  Recent Labs  11/04/13 2026  DDIMER 1.22*   Hemoglobin A1C No results found for this basename: HGBA1C,  in the last 72 hours Fasting Lipid Panel No results found for this basename: CHOL, HDL, LDLCALC, TRIG, CHOLHDL, LDLDIRECT,  in the last 72 hours Thyroid Function Tests No results found for this basename: TSH, T4TOTAL, FREET3, T3FREE, THYROIDAB,  in the last 72 hours  TELE  NSR  ECG  NSR. No ischemic changes.  Radiology/Studies  Dg Chest 2 View  11/04/2013  CLINICAL DATA:  Left-sided chest pain x1 day .  EXAM: CHEST  2 VIEW  COMPARISON:  Chest x-ray 12/19/2012.  FINDINGS: Mediastinum and hilar structures normal. Lungs are clear. Heart size normal. No pleural effusion or pneumothorax. No acute bony abnormality identified. Degenerative changes thoracic spine and both shoulders.  IMPRESSION: No acute abnormality.   Electronically Signed   By: Regina Ganci  Register   On: 11/04/2013 21:14   Ct Angio Chest Pe W/cm &/or Wo Cm  11/05/2013   CLINICAL DATA:  Shortness of breath. Left-sided chest pressure and pain. Evaluate for pulmonary embolism. Initial encounter.  EXAM: CT ANGIOGRAPHY CHEST WITH CONTRAST  TECHNIQUE: Multidetector CT imaging of the chest was performed using the standard protocol during bolus administration of intravenous contrast. Multiplanar CT image reconstructions and MIPs were obtained to evaluate the vascular anatomy.   CONTRAST:  100mL OMNIPAQUE IOHEXOL 350 MG/ML SOLN  COMPARISON:  Chest radiograph - 11/04/2013  FINDINGS: Vascular Findings:  There is adequate opacification of the pulmonary arterial system with the main pulmonary artery measuring 286 Hounsfield units. No discrete filling defects are seen within the pulmonary arterial tree to the level of the bilateral subsegmental pulmonary arteries. Evaluation of distal subsegmental pulmonary arteries is degraded secondary to patient respiratory artifact. Normal caliber the main pulmonary artery.  Cardiomegaly.  No pericardial effusion.  Minimal calcifications within the aortic annulus. Scattered minimal atherosclerotic plaque within a normal caliber thoracic aorta. Bovine configuration of the aortic arch is incidentally noted. The branch vessels of the aortic arch widely patent throughout their imaged course. No thoracic aortic dissection or periaortic stranding.  Review of the MIP images confirms the above findings.   ----------------------------------------------------------------------------------  Nonvascular Findings:  Evaluation of the pulmonary parenchyma is minimally degraded secondary to patient respiratory artifact. There is minimal dependent subpleural ground-glass atelectasis. No discrete focal airspace opacities. No pleural effusion pneumothorax. No discrete pulmonary nodules. The central pulmonary airways appear widely patent.  Shotty mediastinal lymph nodes are individually not enlarged by size criteria with index precarinal lymph node measuring 0.9 cm in greatest short axis diameter. No mediastinal, hilar axillary lymphadenopathy.  Limited early arterial phase evaluation of the upper abdomen demonstrates punctate calcifications within the spleen, the sequela of prior granulomatous infection. Note is made of a punctate (approximately 0.6 cm) hypo attenuating lesion within the dome of the right lobe of the liver (image 70, series 4).  No acute or aggressive osseous  abnormalities. Stigmata of DISH within the lower thoracic spine.  Regional soft tissues appear normal.  Review of the MIP images confirms the above findings.  IMPRESSION: 1. No explanation for patient's shortness of breath and left-sided chest pressure. Specifically, no evidence of pulmonary embolism. 2. Sequela of prior granulomatous infection as above. 3. Cardiomegaly. Further evaluation could be performed with cardiac echo as clinically indicated.   Electronically Signed   By: John  Watts M.D.   On: 11/05/2013 00:21    ASSESSMENT AND PLAN Problem List  1. NSTEMI  2. Hypertension, poorly controlled due to missed meds.  3. H/o CVA  4. DM2  5. Chronic constipation  Plan: Left heart cath today. She is anxious about being awake during cath. Will give valium 5mg on call.The risks and benefits of a cardiac catheterization including, but not limited to, death, stroke, MI, kidney damage and bleeding were discussed with the patient who indicates understanding and agrees to proceed.   Signed, Madylin Fairbank MD  

## 2013-11-06 ENCOUNTER — Encounter (HOSPITAL_COMMUNITY): Payer: Self-pay | Admitting: Physician Assistant

## 2013-11-06 DIAGNOSIS — I214 Non-ST elevation (NSTEMI) myocardial infarction: Secondary | ICD-10-CM | POA: Diagnosis not present

## 2013-11-06 DIAGNOSIS — I10 Essential (primary) hypertension: Secondary | ICD-10-CM

## 2013-11-06 DIAGNOSIS — I369 Nonrheumatic tricuspid valve disorder, unspecified: Secondary | ICD-10-CM

## 2013-11-06 DIAGNOSIS — R002 Palpitations: Secondary | ICD-10-CM

## 2013-11-06 DIAGNOSIS — R079 Chest pain, unspecified: Secondary | ICD-10-CM

## 2013-11-06 LAB — BASIC METABOLIC PANEL
Anion gap: 12 (ref 5–15)
BUN: 11 mg/dL (ref 6–23)
CO2: 28 mEq/L (ref 19–32)
Calcium: 9.2 mg/dL (ref 8.4–10.5)
Chloride: 102 mEq/L (ref 96–112)
Creatinine, Ser: 1.11 mg/dL — ABNORMAL HIGH (ref 0.50–1.10)
GFR calc Af Amer: 59 mL/min — ABNORMAL LOW (ref >90)
GFR calc non Af Amer: 51 mL/min — ABNORMAL LOW (ref >90)
Glucose, Bld: 109 mg/dL — ABNORMAL HIGH (ref 70–99)
Potassium: 3.9 mEq/L (ref 3.7–5.3)
Sodium: 142 mEq/L (ref 137–147)

## 2013-11-06 LAB — CBC
HCT: 34.2 % — ABNORMAL LOW (ref 36.0–46.0)
Hemoglobin: 11 g/dL — ABNORMAL LOW (ref 12.0–15.0)
MCH: 30 pg (ref 26.0–34.0)
MCHC: 32.2 g/dL (ref 30.0–36.0)
MCV: 93.2 fL (ref 78.0–100.0)
Platelets: 243 10*3/uL (ref 150–400)
RBC: 3.67 MIL/uL — ABNORMAL LOW (ref 3.87–5.11)
RDW: 13.7 % (ref 11.5–15.5)
WBC: 8.3 10*3/uL (ref 4.0–10.5)

## 2013-11-06 LAB — GLUCOSE, CAPILLARY: Glucose-Capillary: 102 mg/dL — ABNORMAL HIGH (ref 70–99)

## 2013-11-06 MED ORDER — AMLODIPINE BESYLATE 5 MG PO TABS: 5.0000 mg | ORAL_TABLET | Freq: Every day | ORAL | Status: DC

## 2013-11-06 MED ORDER — GLIPIZIDE-METFORMIN HCL 5-500 MG PO TABS: 1.0000 | ORAL_TABLET | Freq: Every day | ORAL | Status: DC

## 2013-11-06 MED ORDER — AMLODIPINE BESYLATE 5 MG PO TABS
5.0000 mg | ORAL_TABLET | Freq: Every day | ORAL | Status: DC
  Administered 2013-11-06: 10:00:00 5 mg via ORAL
  Filled 2013-11-06: qty 1

## 2013-11-06 MED ORDER — TRAMADOL HCL 50 MG PO TABS: 50.0000 mg | ORAL_TABLET | Freq: Two times a day (BID) | ORAL | Status: DC | PRN

## 2013-11-06 MED ORDER — NITROGLYCERIN 0.4 MG SL SUBL: 0.4000 mg | SUBLINGUAL_TABLET | SUBLINGUAL | Status: DC | PRN

## 2013-11-06 MED ORDER — TRAMADOL HCL 50 MG PO TABS
50.0000 mg | ORAL_TABLET | Freq: Two times a day (BID) | ORAL | Status: DC | PRN
  Administered 2013-11-06: 11:00:00 50 mg via ORAL
  Filled 2013-11-06: qty 1

## 2013-11-06 MED ORDER — ALUM & MAG HYDROXIDE-SIMETH 200-200-20 MG/5ML PO SUSP
30.0000 mL | Freq: Four times a day (QID) | ORAL | Status: DC | PRN
  Administered 2013-11-06: 01:00:00 30 mL via ORAL
  Filled 2013-11-06: qty 30

## 2013-11-06 NOTE — Progress Notes (Signed)
Patient Name: Adriana JumboWilma [name] Date of Encounter: 11/06/2013     Active Problems:   NSTEMI (non-ST elevated myocardial infarction)    SUBJECTIVE  No further chest pain. Feeling well.   CURRENT MEDS . aspirin EC  81 mg Oral Daily  . atorvastatin  80 mg Oral q1800  . cloNIDine  0.1 mg Oral BID  . docusate sodium  100 mg Oral BID  . famotidine  40 mg Oral Daily  . hydrALAZINE  50 mg Oral BID  . hydrochlorothiazide  25 mg Oral Daily  . insulin aspart  0-15 Units Subcutaneous TID WC  . losartan  100 mg Oral Daily  . metoprolol tartrate  100 mg Oral BID  . nitroGLYCERIN  1 inch Topical 4 times per day  . pneumococcal 23 valent vaccine  0.5 mL Intramuscular Tomorrow-1000  . polyethylene glycol  17 g Oral Daily    OBJECTIVE  Filed Vitals:   11/05/13 1949 11/05/13 2000 11/06/13 0002 11/06/13 0425  BP: 158/59 174/66 155/50 148/39  Pulse: 67  68 67  Temp: 98.3 F (36.8 C)  98.1 F (36.7 C) 97.9 F (36.6 C)  TempSrc: Oral  Oral Oral  Resp: 18  18 18   Height:      Weight:   154 lb 8.7 oz (70.1 kg)   SpO2: 96%  94% 96%    Intake/Output Summary (Last 24 hours) at 11/06/13 0626 Last data filed at 11/05/13 1445  Gross per 24 hour  Intake   1156 ml  Output    850 ml  Net    306 ml   Filed Weights   11/05/13 0541 11/05/13 0853 11/06/13 0002  Weight: 152 lb 1.9 oz (69 kg) 152 lb 1.9 oz (69 kg) 154 lb 8.7 oz (70.1 kg)    PHYSICAL EXAM  General: Pleasant, NAD. Neuro: Alert and oriented X 3. Moves all extremities spontaneously. Psych: Normal affect. HEENT:  Normal  Neck: Supple without bruits or JVD. Lungs:  Resp regular and unlabored, CTA. Heart: RRR no s3, s4, or murmurs. Abdomen: Soft, non-tender, non-distended, BS + x 4.  Extremities: No clubbing, cyanosis or edema. DP/PT/Radials 2+ and equal bilaterally.  Accessory Clinical Findings  CBC  Recent Labs  11/05/13 0357 11/06/13 0409  WBC 10.2 8.3  HGB 11.6* 11.0*  HCT 35.1* 34.2*  MCV 92.6 93.2  PLT 261 243    Basic Metabolic Panel  Recent Labs  11/04/13 2014 11/05/13 0357  NA 141 141  K 3.9 3.7  CL 98 100  CO2 21 25  GLUCOSE 115* 117*  BUN 8 7  CREATININE 0.94 0.96  CALCIUM 9.5 8.6   Cardiac Enzymes  Recent Labs  11/05/13 0946 11/05/13 1600 11/05/13 2055  TROPONINI 1.17* 0.80* 0.88*   D-Dimer  Recent Labs  11/04/13 2026  DDIMER 1.22*   Hemoglobin A1C  Recent Labs  11/05/13 0357  HGBA1C 6.4*    TELE  NSR, some bradycardia   Radiology/Studies  Dg Chest 2 View  11/04/2013   CLINICAL DATA:  Left-sided chest pain x1 day .  EXAM: CHEST  2 VIEW  COMPARISON:  Chest x-ray 12/19/2012.  FINDINGS: Mediastinum and hilar structures normal. Lungs are clear. Heart size normal. No pleural effusion or pneumothorax. No acute bony abnormality identified. Degenerative changes thoracic spine and both shoulders.  IMPRESSION: No acute abnormality.   Electronically Signed   By: Maisie Fushomas  Register   On: 11/04/2013 21:14   Ct Angio Chest Pe W/cm &/or Wo Cm  11/05/2013  CLINICAL DATA:  Shortness of breath. Left-sided chest pressure and pain. Evaluate for pulmonary embolism. Initial encounter.  EXAM: CT ANGIOGRAPHY CHEST WITH CONTRAST  TECHNIQUE: Multidetector CT imaging of the chest was performed using the standard protocol during bolus administration of intravenous contrast. Multiplanar CT image reconstructions and MIPs were obtained to evaluate the vascular anatomy.  CONTRAST:  100mL OMNIPAQUE IOHEXOL 350 MG/ML SOLN  COMPARISON:  Chest radiograph - 11/04/2013  FINDINGS: Vascular Findings:  There is adequate opacification of the pulmonary arterial system with the main pulmonary artery measuring 286 Hounsfield units. No discrete filling defects are seen within the pulmonary arterial tree to the level of the bilateral subsegmental pulmonary arteries. Evaluation of distal subsegmental pulmonary arteries is degraded secondary to patient respiratory artifact. Normal caliber the main pulmonary  artery.  Cardiomegaly.  No pericardial effusion.  Minimal calcifications within the aortic annulus. Scattered minimal atherosclerotic plaque within a normal caliber thoracic aorta. Bovine configuration of the aortic arch is incidentally noted. The branch vessels of the aortic arch widely patent throughout their imaged course. No thoracic aortic dissection or periaortic stranding.  Review of the MIP images confirms the above findings.   ----------------------------------------------------------------------------------  Nonvascular Findings:  Evaluation of the pulmonary parenchyma is minimally degraded secondary to patient respiratory artifact. There is minimal dependent subpleural ground-glass atelectasis. No discrete focal airspace opacities. No pleural effusion pneumothorax. No discrete pulmonary nodules. The central pulmonary airways appear widely patent.  Shotty mediastinal lymph nodes are individually not enlarged by size criteria with index precarinal lymph node measuring 0.9 cm in greatest short axis diameter. No mediastinal, hilar axillary lymphadenopathy.  Limited early arterial phase evaluation of the upper abdomen demonstrates punctate calcifications within the spleen, the sequela of prior granulomatous infection. Note is made of a punctate (approximately 0.6 cm) hypo attenuating lesion within the dome of the right lobe of the liver (image 70, series 4).  No acute or aggressive osseous abnormalities. Stigmata of DISH within the lower thoracic spine.  Regional soft tissues appear normal.  Review of the MIP images confirms the above findings.  IMPRESSION: 1. No explanation for patient's shortness of breath and left-sided chest pressure. Specifically, no evidence of pulmonary embolism. 2. Sequela of prior granulomatous infection as above. 3. Cardiomegaly. Further evaluation could be performed with cardiac echo as clinically indicated.   Electronically Signed   By: Simonne ComeJohn  Watts M.D.   On: 11/05/2013 00:21      CARDIAC CATHETERIZATION  HISTORY:  [name] is a 68 year old, African American female, who has a history of hypertension, requiring multiple medications, type 2 diabetes mellitus, as well as remote CVA. She presented to the hospital. This morning with complaints of palpitations associated with increasing shortness of breath. Her ECG revealed diffuse T wave flattening. Cardiac CT was negative for pulmonary embolism. She did have mildly positive troponin. Definitive cardiac catheterization is now recommended.  PROCEDURE: Left heart catheterization via the right radial artery approach: Coronary angiography, left ventriculography.  The patient was brought to the second floor [facility] cath lab in the postabsorptive state. The patient was premedicated with Versed 2 mg and fentanyl 50 mcg. A right radial approach was utilized after an Allen's test verified adequate circulation. The right radial artery was punctured via the Seldinger technique, and a 6 [place] Glidesheath Slender was inserted without difficulty. A radial cocktail consisting of Verapamil, IV nitroglycerin, and lidocaine was administered. Weight adjusted heparin was administered. A safety J wire was advanced into the ascending aorta. Diagnostic catheterization was done  with 5 [place] TIG 4.0 catheter. A 5 French pigtail catheter was used for left ventriculography. A TR radial band was applied for hemostasis. The patient left the catheterization laboratory in stable condition.  HEMODYNAMICS:  Central Aorta: 115/52  Left Ventricle: 115/13  ANGIOGRAPHY:  The Left Main coronary artery was angiographically normal and trifurcated into the LAD, a small ramus intermediate, and the left circumflex coronary artery.  The LAD was angiographically normal and gave rise to a proximal diagonal vessel and several septal perforating arteries. The midportion bifurcated into a twin like LAD-diagonal system, which extended to but did not reach  the apex.  Ramus intermediate vessel was a small caliber normal vessel  The left circumflex coronary artery was angiographically normal and gave rise to one major bifurcating obtuse marginal branch.  The RCA was angiographically normal it gave rise to a large PDA and PLA vessel.  Left ventriculography revealed normal global LV contractility without focal segmental wall motion abnormalities. There was no evidence for mitral regurgitation. There is a "catenoid "appearance to ventricular contraction. Ejection fraction is 65%.  Total contrast used: 65 cc Omnipaque  IMPRESSION:  Normal LV function.  Normal coronary arteries.    ASSESSMENT AND PLAN  [name] is a 68 y.o. female with a history of GERD, DM, HTN requiring multiple medicaions and previous CVA who presented to [facility] on 11/05/13 with chest pain.   NSTEMI- ( troponin 1.3--> 1.7--> 1.17--> 0.80--> 0.88)  -- EKG revealed NSR with diffuse T wave flattening, -- DDimer slightly elevated (1.22). CTA neg for PE  -- Underwent LHC on 11/05/13 which revaled normal LV function and normal coronaries.  -- BMET pending  -- Unsure etiology of her chest pain. Possibly coronary vasospasm or microvascular disease. Consider switching hydralazine to a CCB   HTN- BPs under moderate control.  -- Continue metoprolol 100mg  BID, HCTZ 25mg , clonidine 0.1 BID, losartan 100mg , hydralazine 50mg  BID  DM- continue SSI. HgA1c 6.4 -- Hold glipizide-metformin combination until 48 hours after cardiac cath.   Byrd Hesselbach R PA-C  Pager [phone]   I have seen and evaluated the patient this AM along with Janee Morn, KATHRYN R PA-C. I agree with her findings, examination as well as impression recommendations.  Only 1 brief episode of chest discomfort over night. No dyspnea, PND or orthopnea.  Troponin levels have plateaued - atypical MI pattern & no significant CAD on cath to explain NSTEMI. ? Coronary spasm - will change hydralazine for  amlodipine ? pericarditis - check 2 D Echo   Ambulate today & monitor rhythm &BP.  If no further Sx, BP & HR stable & Echo is normal, anticipate d/c later this afternoon (will likely not be ready for d/c by 11AM)    Marykay Lex, M.D., M.S. Interventional Cardiologist   Pager # [phone]

## 2013-11-06 NOTE — Discharge Instructions (Signed)
Please hold your glipizide- Meformin drug until 11/06/13 after noon ( >48 hours after your cardiac cath)   Radial Site Care Refer to this sheet in the next few weeks. These instructions provide you with information on caring for yourself after your procedure. Your caregiver may also give you more specific instructions. Your treatment has been planned according to current medical practices, but problems sometimes occur. Call your caregiver if you have any problems or questions after your procedure. HOME CARE INSTRUCTIONS  You may shower the day after the procedure.Remove the bandage (dressing) and gently wash the site with plain soap and water.Gently pat the site dry.   Do not apply powder or lotion to the site.   Do not submerge the affected site in water for 3 to 5 days.   Inspect the site at least twice daily.   Do not flex or bend the affected arm for 24 hours.   No lifting over 5 pounds (2.3 kg) for 5 days after your procedure.   Do not drive home if you are discharged the same day of the procedure. Have someone else drive you.   You may drive 24 hours after the procedure unless otherwise instructed by your caregiver.  What to expect:  Any bruising will usually fade within 1 to 2 weeks.   Blood that collects in the tissue (hematoma) may be painful to the touch. It should usually decrease in size and tenderness within 1 to 2 weeks.  SEEK IMMEDIATE MEDICAL CARE IF:  You have unusual pain at the radial site.   You have redness, warmth, swelling, or pain at the radial site.   You have drainage (other than a small amount of blood on the dressing).   You have chills.   You have a fever or persistent symptoms for more than 72 hours.   You have a fever and your symptoms suddenly get worse.   Your arm becomes pale, cool, tingly, or numb.   You have heavy bleeding from the site. Hold pressure on the site.

## 2013-11-06 NOTE — Discharge Summary (Signed)
Discharge Summary   Patient ID: [name] MRN: [id number], DOB/AGE: 01/29/1945 68 y.o. Admit date: 11/04/2013 D/C date:     11/06/2013  Primary Cardiologist: Dr. Herbie BaltimoreHarding  Principal Problem:   NSTEMI (non-ST elevated myocardial infarction) Active Problems:   Essential hypertension   GERD   Diabetes mellitus   CVA (cerebral infarction)   Admission Dates: 11/04/13- 11/06/13 Discharge Diagnosis: NSTEMI s/p LHC with no evidence of CAD and ECHO with no pericarditis.  HPI: [name] is a 68 y.o. female with a history of GERD, DM, HTN requiring multiple medications and previous CVA who [facility] with chest pain.    Hospital Course  NSTEMI- ( troponin 1.3--> 1.7--> 1.17--> 0.80--> 0.88). Troponin levels have plateaued - atypical MI pattern & no significant CAD on cath to explain NSTEMI. -- EKG revealed NSR with diffuse T wave flattening  -- DDimer slightly elevated (1.22). CTA neg for PE  -- Underwent LHC on 11/05/13 which revaled normal LV function and normal coronaries.  -- 2D ECHO: 11/06/2013 EF 65%; no WMA. Mild TR. PA pk pressure 43 mm HG. No evidence of pericarditis -- Unsure etiology of her chest pain. Possibly coronary vasospasm or microvascular disease. Hydralazine replaced with amlodipine to treat possible vasospasm   -- Continue ASA, statin and BB  HTN- BPs under moderate control. Discharge BP 137/55 -- Continue Tenoretic 100-25mg , clonidine 0.1 BID, losartan 100mg , amlodipine 5mg   DM- continue SSI. HgA1c 6.4  -- Hold glipizide-metformin combination until 48 hours after cardiac cath. She can restart 11/06/13 at noon.  The patient has had an uncomplicated hospital course and is recovering well. She walked with cardiac rehab today and did well. The radial catheter site is stable. She has been seen by Dr. Herbie BaltimoreHarding today and deemed ready for discharge home. All follow-up appointments have been scheduled. Discharge medications are listed  below.   Discharge Vitals: Blood pressure 137/55, pulse 75, temperature 98.2 F (36.8 C), temperature source Oral, resp. rate 18, height 5\' 5"  (1.651 m), weight 154 lb 8.7 oz (70.1 kg), SpO2 99.00%.  Labs: Lab Results  Component Value Date   WBC 8.3 11/06/2013   HGB 11.0* 11/06/2013   HCT 34.2* 11/06/2013   MCV 93.2 11/06/2013   PLT 243 11/06/2013     Recent Labs Lab 11/06/13 0710  NA 142  K 3.9  CL 102  CO2 28  BUN 11  CREATININE 1.11*  CALCIUM 9.2  GLUCOSE 109*    Recent Labs  11/05/13 0357 11/05/13 0946 11/05/13 1600 11/05/13 2055  TROPONINI 1.70* 1.17* 0.80* 0.88*   Lab Results  Component Value Date   CHOL 189 06/21/2012   HDL 68 06/21/2012   LDLCALC 100* 06/21/2012   TRIG 107 06/21/2012   Lab Results  Component Value Date   DDIMER 1.22* 11/04/2013    Diagnostic Studies/Procedures   Dg Chest 2 View  11/04/2013   CLINICAL DATA:  Left-sided chest pain x1 day .  EXAM: CHEST  2 VIEW  COMPARISON:  Chest x-ray 12/19/2012.  FINDINGS: Mediastinum and hilar structures normal. Lungs are clear. Heart size normal. No pleural effusion or pneumothorax. No acute bony abnormality identified. Degenerative changes thoracic spine and both shoulders.  IMPRESSION: No acute abnormality  Ct Angio Chest Pe W/cm &/or Wo Cm  11/05/2013   CLINICAL DATA:  Shortness of breath. Left-sided chest pressure and pain. Evaluate for pulmonary embolism. Initial encounter.  EXAM: CT ANGIOGRAPHY CHEST WITH CONTRAST  TECHNIQUE: Multidetector CT imaging of the chest was  performed using the standard protocol during bolus administration of intravenous contrast. Multiplanar CT image reconstructions and MIPs were obtained to evaluate the vascular anatomy.  CONTRAST:  OMNIPAQUE IOHEXOL 350 MG/ML SOLN  COMPARISON:  Chest radiograph - 11/04/2013  FINDINGS: Vascular Findings:  There is adequate opacification of the pulmonary arterial system with the main pulmonary artery measuring 286 Hounsfield units.  No discrete filling defects are seen within the pulmonary arterial tree to the level of the bilateral subsegmental pulmonary arteries. Evaluation of distal subsegmental pulmonary arteries is degraded secondary to patient respiratory artifact. Normal caliber the main pulmonary artery.  Cardiomegaly.  No pericardial effusion.  Minimal calcifications within the aortic annulus. Scattered minimal atherosclerotic plaque within a normal caliber thoracic aorta. Bovine configuration of the aortic arch is incidentally noted. The branch vessels of the aortic arch widely patent throughout their imaged course. No thoracic aortic dissection or periaortic stranding.  Review of the MIP images confirms the above findings.   ----------------------------------------------------------------------------------  Nonvascular Findings:  Evaluation of the pulmonary parenchyma is minimally degraded secondary to patient respiratory artifact. There is minimal dependent subpleural ground-glass atelectasis. No discrete focal airspace opacities. No pleural effusion pneumothorax. No discrete pulmonary nodules. The central pulmonary airways appear widely patent.  Shotty mediastinal lymph nodes are individually not enlarged by size criteria with index precarinal lymph node measuring 0.9 cm in greatest short axis diameter. No mediastinal, hilar axillary lymphadenopathy.  Limited early arterial phase evaluation of the upper abdomen demonstrates punctate calcifications within the spleen, the sequela of prior granulomatous infection. Note is made of a punctate (approximately 0.6 cm) hypo attenuating lesion within the dome of the right lobe of the liver (image 70, series 4).  No acute or aggressive osseous abnormalities. Stigmata of DISH within the lower thoracic spine.  Regional soft tissues appear normal.  Review of the MIP images confirms the above findings.  IMPRESSION: 1. No explanation for patient's shortness of breath and left-sided chest pressure.  Specifically, no evidence of pulmonary embolism. 2. Sequela of prior granulomatous infection as above. 3. Cardiomegaly. Further evaluation could be performed with cardiac echo as clinically indicated.       CARDIAC CATHETERIZATION  HISTORY:  Ms. Keerthi Hazell is a 68 year old, African American female, who has a history of hypertension, requiring multiple medications, type 2 diabetes mellitus, as well as remote CVA. She presented to the hospital. This morning with complaints of palpitations associated with increasing shortness of breath. Her ECG revealed diffuse T wave flattening. Cardiac CT was negative for pulmonary embolism. She did have mildly positive troponin. Definitive cardiac catheterization is now recommended.  PROCEDURE: Left heart catheterization via the right radial artery approach: Coronary angiography, left ventriculography.  The patient was brought to the second floor [facility]iac cath lab in the postabsorptive state. The patient was premedicated with Versed 2 mg and fentanyl 50 mcg. A right radial approach was utilized after an Allen's test verified adequate circulation. The right radial artery was punctured via the Seldinger technique, and a 6 [place] Glidesheath Slender was inserted without difficulty. A radial cocktail consisting of Verapamil, IV nitroglycerin, and lidocaine was administered. Weight adjusted heparin was administered. A safety J wire was advanced into the ascending aorta. Diagnostic catheterization was done with 5 [place] TIG 4.0 catheter. A 5 French pigtail catheter was used for left ventriculography. A TR radial band was applied for hemostasis. The patient left the catheterization laboratory in stable condition.  HEMODYNAMICS:  Central Aorta: 115/52  Left Ventricle: 115/13  ANGIOGRAPHY:  The  Left Main coronary artery was angiographically normal and trifurcated into the LAD, a small ramus intermediate, and the left circumflex coronary artery.  The LAD was  angiographically normal and gave rise to a proximal diagonal vessel and several septal perforating arteries. The midportion bifurcated into a twin like LAD-diagonal system, which extended to but did not reach the apex.  Ramus intermediate vessel was a small caliber normal vessel  The left circumflex coronary artery was angiographically normal and gave rise to one major bifurcating obtuse marginal branch.  The RCA was angiographically normal it gave rise to a large PDA and PLA vessel.  Left ventriculography revealed normal global LV contractility without focal segmental wall motion abnormalities. There was no evidence for mitral regurgitation. There is a "catenoid "appearance to ventricular contraction. Ejection fraction is 65%.  Total contrast used: 65 cc Omnipaque  IMPRESSION:  Normal LV function.  Normal coronary arteries.    2D ECHO: 11/06/2013 EF 65%; no WMA. Mild TR. PA pk pressure 43 mm HG LV EF: 65% Study Conclusions - Left ventricle: The cavity size was normal. Wall thickness was normal. The estimated ejection fraction was 65%. Wall motion was normal; there were no regional wall motion abnormalities. - Right ventricle: The cavity size was normal. Systolic function was normal. - Tricuspid valve: There was mild regurgitation. - Pulmonary arteries: PA peak pressure: 43 mm Hg (S). (Slightly lower than last study)     Discharge Medications     Medication List    STOP taking these medications       hydrALAZINE 50 MG tablet  Commonly known as:  APRESOLINE      TAKE these medications       ALPRAZolam 0.25 MG tablet  Commonly known as:  XANAX  Take 1 tablet (0.25 mg total) by mouth at bedtime as needed for sleep.     amLODipine 5 MG tablet  Commonly known as:  NORVASC  Take 1 tablet (5 mg total) by mouth daily.     aspirin 81 MG tablet  Take 81 mg by mouth daily.     atenolol-chlorthalidone 100-25 MG per tablet  Commonly known as:  TENORETIC  Take 1 tablet by mouth  daily.     atorvastatin 10 MG tablet  Commonly known as:  LIPITOR  Take 1 tablet (10 mg total) by mouth daily at 6 PM.     cloNIDine 0.1 MG tablet  Commonly known as:  CATAPRES  Take 0.1 mg by mouth 2 (two) times daily.     famotidine 40 MG tablet  Commonly known as:  PEPCID  Take 40 mg by mouth daily.     glipiZIDE-metformin 5-500 MG per tablet  Commonly known as:  METAGLIP  Take 1 tablet by mouth at bedtime.  Start taking on:  11/07/2013     losartan 100 MG tablet  Commonly known as:  COZAAR  Take 1 tablet (100 mg total) by mouth daily.     nitroGLYCERIN 0.4 MG SL tablet  Commonly known as:  NITROSTAT  Place 1 tablet (0.4 mg total) under the tongue every 5 (five) minutes x 3 doses as needed for chest pain.     sodium-potassium bicarbonate Tbef dissolvable tablet  Commonly known as:  ALKA-SELTZER GOLD  Take 2 tablets by mouth daily as needed (for indigestion).     traMADol 50 MG tablet  Commonly known as:  ULTRAM  Take 1 tablet (50 mg total) by mouth every 12 (twelve) hours as needed for moderate pain.  Disposition   The patient will be discharged in stable condition to home.  Follow-up Information   Follow up with HARDING,DAVID W, MD. (The office will call you to make an appoinment., If you do not hear from them, please contact them., You should be seen within 2-3 weeks.)    Specialty:  Cardiology   Contact information:   [address] [place] [address] [phone]         Duration of Discharge Encounter: Greater than 30 minutes including physician and PA time.  Byrd HesselbachSigned, Lorretta Kerce R PA-C 11/06/2013, 12:13 PM

## 2013-11-06 NOTE — Progress Notes (Signed)
  Echocardiogram 2D Echocardiogram has been performed.  [name] 11/06/2013, 8:53 AM

## 2013-11-06 NOTE — Progress Notes (Signed)
CARDIAC REHAB PHASE I   PRE:  Rate/Rhythm: 69 SR  BP:  Supine:   Sitting: 154/62  Standing:    SaO2:   MODE:  Ambulation: 600 ft   POST:  Rate/Rhythm: 86 SR  BP:  Supine:   Sitting: 203/83,  198/78  Standing:    SaO2:  1010-1105 Pt walked 600 ft with slow pace but steady. No CP. Said she walks slow since stroke. Pt c/o belly hurting during walk and stated it hurts every time she eats something. BP elevated after walk. Pt was tearful on and off whole session as she talked about living alone and sugars and BP get high and she feels bad. Stated daughter is having financial problems and she has been really worried about her. Stated her stove does not work and she used microwave a lot. Encouraged her to walk as she can. Stated when she feels bad she sits on couch all day. Said she has stationary bike that she rides when she feels like it. Encouraged her to watch salt and eat low carb foods. No transportation for CRP 2.  Gave MI booklet and diabetic diet for information to review. RN aware of elevated BP.   Adriana Nuttingharlene Amyriah Buras, RN BSN  11/06/2013 11:00 AM

## 2013-11-06 NOTE — Discharge Summary (Signed)
Pt. Seen & examined. Stable for discharge. Agree with D/C summary.  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  Marykay LexHARDING,DAVID W, MD

## 2013-11-13 ENCOUNTER — Encounter: Payer: Self-pay | Admitting: Cardiovascular Disease

## 2013-11-30 ENCOUNTER — Ambulatory Visit: Payer: PRIVATE HEALTH INSURANCE | Admitting: Cardiology

## 2013-12-14 ENCOUNTER — Emergency Department (HOSPITAL_COMMUNITY): Payer: PRIVATE HEALTH INSURANCE

## 2013-12-14 ENCOUNTER — Encounter (HOSPITAL_COMMUNITY): Payer: Self-pay | Admitting: Emergency Medicine

## 2013-12-14 ENCOUNTER — Emergency Department (HOSPITAL_COMMUNITY)
Admission: EM | Admit: 2013-12-14 | Discharge: 2013-12-14 | Disposition: A | Discharge status: Home / Self Care | Payer: PRIVATE HEALTH INSURANCE | Attending: Emergency Medicine | Admitting: Emergency Medicine

## 2013-12-14 DIAGNOSIS — I10 Essential (primary) hypertension: Secondary | ICD-10-CM | POA: Diagnosis not present

## 2013-12-14 DIAGNOSIS — R002 Palpitations: Secondary | ICD-10-CM | POA: Insufficient documentation

## 2013-12-14 DIAGNOSIS — Z7902 Long term (current) use of antithrombotics/antiplatelets: Secondary | ICD-10-CM | POA: Insufficient documentation

## 2013-12-14 DIAGNOSIS — K219 Gastro-esophageal reflux disease without esophagitis: Secondary | ICD-10-CM | POA: Diagnosis not present

## 2013-12-14 DIAGNOSIS — R0602 Shortness of breath: Secondary | ICD-10-CM | POA: Diagnosis not present

## 2013-12-14 DIAGNOSIS — Z79899 Other long term (current) drug therapy: Secondary | ICD-10-CM | POA: Insufficient documentation

## 2013-12-14 DIAGNOSIS — Z8673 Personal history of transient ischemic attack (TIA), and cerebral infarction without residual deficits: Secondary | ICD-10-CM | POA: Insufficient documentation

## 2013-12-14 DIAGNOSIS — R200 Anesthesia of skin: Secondary | ICD-10-CM | POA: Insufficient documentation

## 2013-12-14 DIAGNOSIS — F419 Anxiety disorder, unspecified: Secondary | ICD-10-CM | POA: Insufficient documentation

## 2013-12-14 DIAGNOSIS — E119 Type 2 diabetes mellitus without complications: Secondary | ICD-10-CM | POA: Diagnosis not present

## 2013-12-14 LAB — URINALYSIS, ROUTINE W REFLEX MICROSCOPIC
Bilirubin Urine: NEGATIVE
Glucose, UA: NEGATIVE mg/dL
Hgb urine dipstick: NEGATIVE
Ketones, ur: NEGATIVE mg/dL
Nitrite: NEGATIVE
Protein, ur: NEGATIVE mg/dL
Specific Gravity, Urine: 1.006 (ref 1.005–1.030)
Urobilinogen, UA: 0.2 mg/dL (ref 0.0–1.0)
pH: 6.5 (ref 5.0–8.0)

## 2013-12-14 LAB — COMPREHENSIVE METABOLIC PANEL
ALT: 23 U/L (ref 0–35)
AST: 29 U/L (ref 0–37)
Albumin: 3.7 g/dL (ref 3.5–5.2)
Alkaline Phosphatase: 124 U/L — ABNORMAL HIGH (ref 39–117)
Anion gap: 22 — ABNORMAL HIGH (ref 5–15)
BUN: 10 mg/dL (ref 6–23)
CO2: 21 mEq/L (ref 19–32)
Calcium: 9.1 mg/dL (ref 8.4–10.5)
Chloride: 93 mEq/L — ABNORMAL LOW (ref 96–112)
Creatinine, Ser: 0.96 mg/dL (ref 0.50–1.10)
GFR calc Af Amer: 70 mL/min — ABNORMAL LOW (ref >90)
GFR calc non Af Amer: 60 mL/min — ABNORMAL LOW (ref >90)
Glucose, Bld: 191 mg/dL — ABNORMAL HIGH (ref 70–99)
Potassium: 3.3 mEq/L — ABNORMAL LOW (ref 3.7–5.3)
Sodium: 136 mEq/L — ABNORMAL LOW (ref 137–147)
Total Bilirubin: 0.3 mg/dL (ref 0.3–1.2)
Total Protein: 8.2 g/dL (ref 6.0–8.3)

## 2013-12-14 LAB — CBC WITH DIFFERENTIAL/PLATELET
Basophils Absolute: 0 10*3/uL (ref 0.0–0.1)
Basophils Relative: 0 % (ref 0–1)
Eosinophils Absolute: 0 10*3/uL (ref 0.0–0.7)
Eosinophils Relative: 0 % (ref 0–5)
HCT: 37.9 % (ref 36.0–46.0)
Hemoglobin: 12.6 g/dL (ref 12.0–15.0)
Lymphocytes Relative: 12 % (ref 12–46)
Lymphs Abs: 1.3 10*3/uL (ref 0.7–4.0)
MCH: 31 pg (ref 26.0–34.0)
MCHC: 33.2 g/dL (ref 30.0–36.0)
MCV: 93.3 fL (ref 78.0–100.0)
Monocytes Absolute: 0.7 10*3/uL (ref 0.1–1.0)
Monocytes Relative: 6 % (ref 3–12)
Neutro Abs: 8.6 10*3/uL — ABNORMAL HIGH (ref 1.7–7.7)
Neutrophils Relative %: 82 % — ABNORMAL HIGH (ref 43–77)
Platelets: 245 10*3/uL (ref 150–400)
RBC: 4.06 MIL/uL (ref 3.87–5.11)
RDW: 12.8 % (ref 11.5–15.5)
WBC: 10.6 10*3/uL — ABNORMAL HIGH (ref 4.0–10.5)

## 2013-12-14 LAB — PROTIME-INR
INR: 0.93 (ref 0.00–1.49)
Prothrombin Time: 12.6 seconds (ref 11.6–15.2)

## 2013-12-14 LAB — URINE MICROSCOPIC-ADD ON

## 2013-12-14 MED ORDER — ACETAMINOPHEN 500 MG PO TABS
1000.0000 mg | ORAL_TABLET | Freq: Once | ORAL | Status: AC
  Administered 2013-12-14: 1000 mg via ORAL
  Filled 2013-12-14: qty 2

## 2013-12-14 NOTE — ED Notes (Signed)
Pt states she has no way home and cannot walk from the bus stop. Pt was given taxi voucher.

## 2013-12-14 NOTE — Discharge Instructions (Signed)
Health Maintenance Adopting a healthy lifestyle and getting preventive care can go a long way to promote health and wellness. Talk with your health care provider about what schedule of regular examinations is right for you. This is a good chance for you to check in with your provider about disease prevention and staying healthy. In between checkups, there are plenty of things you can do on your own. Experts have done a lot of research about which lifestyle changes and preventive measures are most likely to keep you healthy. Ask your health care provider for more information. WEIGHT AND DIET  Eat a healthy diet  Be sure to include plenty of vegetables, fruits, low-fat dairy products, and lean protein.  Do not eat a lot of foods high in solid fats, added sugars, or salt.  Get regular exercise. This is one of the most important things you can do for your health.  Most adults should exercise for at least 150 minutes each week. The exercise should increase your heart rate and make you sweat (moderate-intensity exercise).  Most adults should also do strengthening exercises at least twice a week. This is in addition to the moderate-intensity exercise.  Maintain a healthy weight  Body mass index (BMI) is a measurement that can be used to identify possible weight problems. It estimates body fat based on height and weight. Your health care provider can help determine your BMI and help you achieve or maintain a healthy weight.  For females 20 years of age and older:   A BMI below 18.5 is considered underweight.  A BMI of 18.5 to 24.9 is normal.  A BMI of 25 to 29.9 is considered overweight.  A BMI of 30 and above is considered obese.  Watch levels of cholesterol and blood lipids  You should start having your blood tested for lipids and cholesterol at 67 years of age, then have this test every 5 years.  You may need to have your cholesterol levels checked more often if:  Your lipid or  cholesterol levels are high.  You are older than 67 years of age.  You are at high risk for heart disease.  CANCER SCREENING   Lung Cancer  Lung cancer screening is recommended for adults 55-80 years old who are at high risk for lung cancer because of a history of smoking.  A yearly low-dose CT scan of the lungs is recommended for people who:  Currently smoke.  Have quit within the past 15 years.  Have at least a 30-pack-year history of smoking. A pack year is smoking an average of one pack of cigarettes a day for 1 year.  Yearly screening should continue until it has been 15 years since you quit.  Yearly screening should stop if you develop a health problem that would prevent you from having lung cancer treatment.  Breast Cancer  Practice breast self-awareness. This means understanding how your breasts normally appear and feel.  It also means doing regular breast self-exams. Let your health care provider know about any changes, no matter how small.  If you are in your 20s or 30s, you should have a clinical breast exam (CBE) by a health care provider every 1-3 years as part of a regular health exam.  If you are 40 or older, have a CBE every year. Also consider having a breast X-ray (mammogram) every year.  If you have a family history of breast cancer, talk to your health care provider about genetic screening.  If you are   at high risk for breast cancer, talk to your health care provider about having an MRI and a mammogram every year.  Breast cancer gene (BRCA) assessment is recommended for women who have family members with BRCA-related cancers. BRCA-related cancers include:  Breast.  Ovarian.  Tubal.  Peritoneal cancers.  Results of the assessment will determine the need for genetic counseling and BRCA1 and BRCA2 testing. Cervical Cancer Routine pelvic examinations to screen for cervical cancer are no longer recommended for nonpregnant women who are considered low  risk for cancer of the pelvic organs (ovaries, uterus, and vagina) and who do not have symptoms. A pelvic examination may be necessary if you have symptoms including those associated with pelvic infections. Ask your health care provider if a screening pelvic exam is right for you.   The Pap test is the screening test for cervical cancer for women who are considered at risk.  If you had a hysterectomy for a problem that was not cancer or a condition that could lead to cancer, then you no longer need Pap tests.  If you are older than 65 years, and you have had normal Pap tests for the past 10 years, you no longer need to have Pap tests.  If you have had past treatment for cervical cancer or a condition that could lead to cancer, you need Pap tests and screening for cancer for at least 20 years after your treatment.  If you no longer get a Pap test, assess your risk factors if they change (such as having a new sexual partner). This can affect whether you should start being screened again.  Some women have medical problems that increase their chance of getting cervical cancer. If this is the case for you, your health care provider may recommend more frequent screening and Pap tests.  The human papillomavirus (HPV) test is another test that may be used for cervical cancer screening. The HPV test looks for the virus that can cause cell changes in the cervix. The cells collected during the Pap test can be tested for HPV.  The HPV test can be used to screen women 84 years of age and older. Getting tested for HPV can extend the interval between normal Pap tests from three to five years.  An HPV test also should be used to screen women of any age who have unclear Pap test results.  After 67 years of age, women should have HPV testing as often as Pap tests.  Colorectal Cancer  This type of cancer can be detected and often prevented.  Routine colorectal cancer screening usually begins at 67 years of  age and continues through 67 years of age.  Your health care provider may recommend screening at an earlier age if you have risk factors for colon cancer.  Your health care provider may also recommend using home test kits to check for hidden blood in the stool.  A small camera at the end of a tube can be used to examine your colon directly (sigmoidoscopy or colonoscopy). This is done to check for the earliest forms of colorectal cancer.  Routine screening usually begins at age 48.  Direct examination of the colon should be repeated every 5-10 years through 67 years of age. However, you may need to be screened more often if early forms of precancerous polyps or small growths are found. Skin Cancer  Check your skin from head to toe regularly.  Tell your health care provider about any new moles or changes in  moles, especially if there is a change in a mole's shape or color.  Also tell your health care provider if you have a mole that is larger than the size of a pencil eraser.  Always use sunscreen. Apply sunscreen liberally and repeatedly throughout the day.  Protect yourself by wearing long sleeves, pants, a wide-brimmed hat, and sunglasses whenever you are outside. HEART DISEASE, DIABETES, AND HIGH BLOOD PRESSURE   Have your blood pressure checked at least every 1-2 years. High blood pressure causes heart disease and increases the risk of stroke.  If you are between 55 years and 79 years old, ask your health care provider if you should take aspirin to prevent strokes.  Have regular diabetes screenings. This involves taking a blood sample to check your fasting blood sugar level.  If you are at a normal weight and have a low risk for diabetes, have this test once every three years after 67 years of age.  If you are overweight and have a high risk for diabetes, consider being tested at a younger age or more often. PREVENTING INFECTION  Hepatitis B  If you have a higher risk for  hepatitis B, you should be screened for this virus. You are considered at high risk for hepatitis B if:  You were born in a country where hepatitis B is common. Ask your health care provider which countries are considered high risk.  Your parents were born in a high-risk country, and you have not been immunized against hepatitis B (hepatitis B vaccine).  You have HIV or AIDS.  You use needles to inject street drugs.  You live with someone who has hepatitis B.  You have had sex with someone who has hepatitis B.  You get hemodialysis treatment.  You take certain medicines for conditions, including cancer, organ transplantation, and autoimmune conditions. Hepatitis C  Blood testing is recommended for:  Everyone born from 1945 through 1965.  Anyone with known risk factors for hepatitis C. Sexually transmitted infections (STIs)  You should be screened for sexually transmitted infections (STIs) including gonorrhea and chlamydia if:  You are sexually active and are younger than 67 years of age.  You are older than 67 years of age and your health care provider tells you that you are at risk for this type of infection.  Your sexual activity has changed since you were last screened and you are at an increased risk for chlamydia or gonorrhea. Ask your health care provider if you are at risk.  If you do not have HIV, but are at risk, it may be recommended that you take a prescription medicine daily to prevent HIV infection. This is called pre-exposure prophylaxis (PrEP). You are considered at risk if:  You are sexually active and do not regularly use condoms or know the HIV status of your partner(s).  You take drugs by injection.  You are sexually active with a partner who has HIV. Talk with your health care provider about whether you are at high risk of being infected with HIV. If you choose to begin PrEP, you should first be tested for HIV. You should then be tested every 3 months for  as long as you are taking PrEP.  PREGNANCY   If you are premenopausal and you may become pregnant, ask your health care provider about preconception counseling.  If you may become pregnant, take 400 to 800 micrograms (mcg) of folic acid every day.  If you want to prevent pregnancy, talk to your   health care provider about birth control (contraception). OSTEOPOROSIS AND MENOPAUSE   Osteoporosis is a disease in which the bones lose minerals and strength with aging. This can result in serious bone fractures. Your risk for osteoporosis can be identified using a bone density scan.  If you are 60 years of age or older, or if you are at risk for osteoporosis and fractures, ask your health care provider if you should be screened.  Ask your health care provider whether you should take a calcium or vitamin D supplement to lower your risk for osteoporosis.  Menopause may have certain physical symptoms and risks.  Hormone replacement therapy may reduce some of these symptoms and risks. Talk to your health care provider about whether hormone replacement therapy is right for you.  HOME CARE INSTRUCTIONS   Schedule regular health, dental, and eye exams.  Stay current with your immunizations.   Do not use any tobacco products including cigarettes, chewing tobacco, or electronic cigarettes.  If you are pregnant, do not drink alcohol.  If you are breastfeeding, limit how much and how often you drink alcohol.  Limit alcohol intake to no more than 1 drink per day for nonpregnant women. One drink equals 12 ounces of beer, 5 ounces of wine, or 1 ounces of hard liquor.  Do not use street drugs.  Do not share needles.  Ask your health care provider for help if you need support or information about quitting drugs.  Tell your health care provider if you often feel depressed.  Tell your health care provider if you have ever been abused or do not feel safe at home. Document Released: 07/24/2010  Document Revised: 05/25/2013 Document Reviewed: 12/10/2012 [facility] Patient Information 2015 [place], [place]. This information is not intended to replace advice given to you by your health care provider. Make sure you discuss any questions you have with your health care provider.  Emergency Department Resource Guide 1) Find a Doctor and Pay Out of Pocket Although you won't have to find out who is covered by your insurance plan, it is a good idea to ask around and get recommendations. You will then need to call the office and see if the doctor you have chosen will accept you as a new patient and what types of options they offer for patients who are self-pay. Some doctors offer discounts or will set up payment plans for their patients who do not have insurance, but you will need to ask so you aren't surprised when you get to your appointment.  2) Contact Your Local Health Department Not all health departments have doctors that can see patients for sick visits, but many do, so it is worth a call to see if yours does. If you don't know where your local health department is, you can check in your phone book. The CDC also has a tool to help you locate your state's health department, and many state websites also have listings of all of their local health departments.  3) Find a [facility] If your illness is not likely to be very severe or complicated, you may want to try a walk in clinic. These are popping up all over the country in pharmacies, drugstores, and shopping centers. They're usually staffed by nurse practitioners or physician assistants that have been trained to treat common illnesses and complaints. They're usually fairly quick and inexpensive. However, if you have serious medical issues or chronic medical problems, these are probably not your best option.  No Primary Care  Doctor: - Call Health Connect at  [phone] - they can help you locate a primary care doctor that  accepts your insurance,  provides certain services, etc. - Physician Referral Service- [phone]  Chronic Pain Problems: Organization         Address  Phone   Notes  [facility]  [phone] Patients need to be referred by their primary care doctor.   Medication Assistance: Organization         Address  Phone   Notes  [facility] [address] [place] [place], [place] [address] [phone] --Must be a resident of [facility] -- Must have NO insurance coverage whatsoever (no Medicaid/ Medicare, etc.) -- The pt. MUST have a primary care doctor that directs their care regularly and follows them in the community   Med[facility]ist  [phone]   [employer]  [phone]    Agencies that provide inexpensive medical care: Organization         Address  Phone   Notes  [name]  [phone]   [name]    [phone]   [facility] [address] [place], [place] [address] [phone]   [facility] [address] [place] [place] [phone]   Planned Parenthood    [phone]   [facility]    [phone]   [facility]  [address], [place] Phone:  [phone], Fax:  [phone] Hours of Operation:  9 am - 6 pm, M-F.  Also accepts Medicaid/Medicare and self-pay.  [facility]  [address], [place] Phone: [phone], Fax: [phone]. Hours of Operation:  8:30 am - 5:30 pm, M-F.  Also accepts Medicaid and self-pay.  [facility] [place] [address], [place] Phone: [phone]   [facility] [address] Natasha Bence [place], [place] [phone], Ext. [phone] Mondays & Thursdays: 7-9 AM.  First 15 patients are seen on a first come, first serve basis.    Medicaid-accepting [facility] Providers:  Organization         Address  Phone    Notes  [facility] [address], [place] [phone] Also accepts self-pay patients.  [facility] [address] Laurell Josephs [place], [place]  [phone]   [facility] [address], [place] [phone]   [facility] [address] [place] [phone]   Renaye Rakers [address], [place]   [phone] Only accepts [place] Access [place] patients after they have their name applied to their card.   Self-Pay (no insurance) in [facility]:  Organization         Address  Phone   Notes  Sickle Cell Patients, [facility] [address] [place], [place] [phone]   [facility] [address] [place], [place] [phone]   [name] [place]  [address], [place] [phone]   [facility]/Dr. Osei-Bonsu  [address], [place] or [address], [place] [phone] Phone number for both [place] and [place] locations is the same.  [facility] [address], [place]in[place] Otto [phone]   [facility] [address], [place] or [address] [phone]   [facility] [address], [place] [phone], phone; [phone], fax Sees patients 1st and 3rd Saturday of every month.  Must not qualify for public or private insurance (i.e. Medicaid, Medicare, [place] Health Choice, Veterans' Benefits)  Household income should be no more than 200% of the poverty level The clinic cannot treat you if you are pregnant or think you are pregnant  Sexually transmitted diseases are not treated at the clinic.    Dental Care: Organization         Address  Phone  Notes  [facility] of [facility] [address] [place], [place] [phone] Accepts children up to age 49 who are enrolled in [place] or [place] Health Choice; pregnant women with a Medicaid card; and children who have applied for Medicaid or [place] Health Choice, but were declined, whose parents can pay a reduced fee at time of service.  [facility] Department of [facility]  [address], [place] [phone] Accepts children up to age 20 who are enrolled in [place] or [place] Health Choice; pregnant women with a Medicaid card; and children who have applied for Medicaid or [place] Health Choice, but were declined, whose parents can pay a reduced fee at time of service.  [facility]  [address] [place], [place] [phone] Patients are seen by appointment only. Walk-ins are not accepted. [facility] will see patients 15 years of age and older. Monday - Tuesday (8am-5pm) Most Wednesdays (8:30-5pm) $30 per visit, cash only  [facility] Adult Dental Access PROGRAM  [address], [facility] [phone] Patients are seen by appointment only. Walk-ins are not accepted. [facility] will see patients 32 years of age and older. One Wednesday Evening (Monthly: Volunteer Based).  $30 per visit, cash only  [employer]  [phone] for adults; Children under age 3, call Graduate Pediatric Dentistry at [phone]. Children aged 74-14, please call [phone] to request a pediatric application.  Dental services are provided in all areas of dental care including fillings, crowns and bridges, complete and partial dentures, implants, gum treatment, root canals, and extractions. Preventive care is also provided. Treatment is provided to both adults and children. Patients are selected via a lottery and there is often a waiting list.   [facility] [address], [place]  [phone] [email or web address]   [facility] [address] [place], [place] [phone], Ext.  [phone] Second and Fourth Thursday of each month, opens at 6:30 AM; Clinic ends at 9 AM.  Patients are seen on a first-come first-served basis, and a limited number are seen during each clinic.   [facility]  [address] Ether [place], [place] [phone]   Eligibility Requirements You must have lived in [place], [place], or [place] counties for at least the last three months.   You cannot be eligible for state or federal sponsored [place], including [employer], [place], or [employer].   You generally cannot be eligible for healthcare insurance through your employer.    How to apply: Eligibility screenings are held every Tuesday and Wednesday afternoon from 1:00 pm until 4:00 pm. You do not need an appointment for the interview!  [facility] [address] [place], [place] [phone]   Aaron Edelman  [place] Department  [place]  [phone]    Behavioral Health Resources in the Community: Intensive Outpatient Programs Organization         Address  Phone  Notes  [place]. [address], [place], [place] [phone]   [facility] Outpatient [address], [place], [place]   [facility] [address] [place], [place]   [place] [address]  [place], [place] or [phone]   Substance Abuse Resources Organization         Address  Phone  Notes  Al[facility] Drug Services  [phone]   [place]  [phone]   [facility] [place]   Chinita Pester  [phone]   Residential & Outpatient Substance Abuse Program  [phone]   Psychological Services Organization         Address  Phone  Notes  [facility] [place]  [place]  [phone]   [place] [address] [place] or [phone]    Mobile Crisis Teams Organization         Address  Phone  Notes  [facility], Mobile Crisis Care Unit  [phone]   [facility]  [address] [place], [place]   [place] [address] [place] [phone]    Self-Help/Support Groups Organization         Address  Phone             Notes  [place]. of [place] - variety of support groups  [place] Call for more information  Narcotics Anonymous (NA), [facility] [address], [employer]   2 meetings at this location   Special educational needs teacher         Address  Phone  Notes  [facility] [place],    [place]  [phone]   [facility]  [address] [place] [phone], [place], [place]   Heathrow Aiken, [place] [phone] Admissions: 8am-3pm M-F  Incentives Substance [place] [address],    [place], [place] [phone]   [facility] [address] [place], [place], [place]   [facility] [address]  [place], [place]   [facility] - Intensive Outpatient [address] Kristeen Mans [address], [place], [place]   [facility] [place].,  [place], [place] [phone] or [phone]   [facility] ([facility]) [address] [place], [place] Accepts Medicaid  [facility] [place] [address]  [place] [phone] Substance Abuse/Addiction Treatment   [facility] Organization         Address  Phone  Notes  [facility]  [phone]   Domenic Schwab, PhD [address] Arlis Porta [place], [place]   [phone] or [phone]   [place]  [place], [place] [phone]   [place] [address] [place], [place] [phone] Insurance/Medicaid/sponsorship through [employer] and Families [address] [address]  [place], [place] [phone] [place], [place] [phone]    Dr. Adele Schilder  [phone]   [facility] Dept. 1) [address] [place] 2) [place] 3)  [place] [place] [address], [place] [phone]  [phone]   [place] [phone] or [phone] (After Hours)

## 2013-12-14 NOTE — ED Provider Notes (Signed)
CSN: [id number]     Arrival date & time 12/14/13  1954 History   First MD Initiated Contact with Patient 12/14/13 1956     Chief Complaint  Patient presents with  . Numbness   HPI  Patient is a 68 year old female who presents to the emergency room via EMS for total body numbness. Per the patient at 4:00 while she was watching TV she experienced total body numbness. She has continued to have total body numbness since that time. She states that her heart was fluttering really fast, she had numbness, and she had some shortness of breath. She said that this made her very anxious because she's had a previous stroke. Patient also states that her blood pressure has been poor for the past week. Patient states that she has run out of any of her medications and has not been able to get them filled. Patient states that her blood pressure was 200/100 at home. Patient states that she was by herself and does not have anybody to help her. She states that she wishes that she did not live by herself.  Past Medical History  Diagnosis Date  . Diabetes mellitus without complication   . Hypertension   . Stroke   . GERD 09/04/2006    Qualifier: Diagnosis of  By: Duke SalviaGore, Denise    . History of echocardiogram     a. 2D ECHO: 11/06/2013 EF 65%; no WMA. Mild TR. PA pk pressure 43 mm HG   Past Surgical History  Procedure Laterality Date  . Abdominal hysterectomy     History reviewed. No pertinent family history. History  Substance Use Topics  . Smoking status: Never Smoker   . Smokeless tobacco: Not on file  . Alcohol Use: No   OB History    No data available     Review of Systems  Constitutional: Negative for fever, chills and fatigue.  Respiratory: Negative for chest tightness and shortness of breath.   Cardiovascular: Positive for palpitations. Negative for chest pain and leg swelling.  Gastrointestinal: Negative for nausea, vomiting, abdominal pain, diarrhea, constipation and blood in stool.   Genitourinary: Negative for dysuria, urgency, frequency, hematuria and difficulty urinating.  Neurological: Positive for numbness. Negative for dizziness, tremors, speech difficulty, weakness and headaches.  Psychiatric/Behavioral: The patient is nervous/anxious.   All other systems reviewed and are negative.     Allergies  Codeine and Ibuprofen  Home Medications   Prior to Admission medications   Medication Sig Start Date End Date Taking? Authorizing Provider  amLODipine (NORVASC) 5 MG tablet Take 1 tablet (5 mg total) by mouth daily. 11/06/13  Yes Janetta HoraKathryn R Thompson, PA-C  atenolol-chlorthalidone (TENORETIC) 100-25 MG per tablet Take 1 tablet by mouth daily.   Yes Historical Provider, MD  cloNIDine (CATAPRES) 0.2 MG tablet Take 0.2 mg by mouth 2 (two) times daily.   Yes Historical Provider, MD  clopidogrel (PLAVIX) 75 MG tablet Take 75 mg by mouth daily.   Yes Historical Provider, MD  diphenhydramine-acetaminophen (TYLENOL PM) 25-500 MG TABS Take 1 tablet by mouth at bedtime as needed (FOR SLEEP).   Yes Historical Provider, MD  ezetimibe (ZETIA) 10 MG tablet Take 10 mg by mouth daily.   Yes Historical Provider, MD  famotidine (PEPCID) 40 MG tablet Take 40 mg by mouth daily.   Yes Historical Provider, MD  glipiZIDE-metformin (METAGLIP) 5-500 MG per tablet Take 1 tablet by mouth at bedtime. Patient taking differently: Take 2 tablets by mouth at bedtime.  11/07/13  Yes Samara DeistKathryn  R Thompson, PA-C  hydrALAZINE (APRESOLINE) 100 MG tablet Take 100 mg by mouth 3 (three) times daily.   Yes Historical Provider, MD  nitroGLYCERIN (NITROSTAT) 0.4 MG SL tablet Place 1 tablet (0.4 mg total) under the tongue every 5 (five) minutes x 3 doses as needed for chest pain. 11/06/13  Yes Janetta Hora, PA-C  traMADol (ULTRAM) 50 MG tablet Take 1 tablet (50 mg total) by mouth every 12 (twelve) hours as needed for moderate pain. 11/06/13  Yes Janetta Hora, PA-C  ALPRAZolam Prudy Feeler) 0.25 MG tablet Take  1 tablet (0.25 mg total) by mouth at bedtime as needed for sleep. 05/12/13   Gavin Pound. Ghim, MD  atorvastatin (LIPITOR) 10 MG tablet Take 1 tablet (10 mg total) by mouth daily at 6 PM. 06/27/12   Mcarthur Rossetti Angiulli, PA-C  losartan (COZAAR) 100 MG tablet Take 1 tablet (100 mg total) by mouth daily. 06/27/12   Mcarthur Rossetti Angiulli, PA-C   BP 144/60 mmHg  Pulse 84  Temp(Src) 98.9 F (37.2 C) (Oral)  Resp 22  SpO2 99% Physical Exam  Constitutional: She is oriented to person, place, and time. She appears well-developed and well-nourished. No distress.  HENT:  Head: Normocephalic and atraumatic.  Mouth/Throat: Oropharynx is clear and moist. No oropharyngeal exudate.  Eyes: Conjunctivae and EOM are normal. Pupils are equal, round, and reactive to light. No scleral icterus.  Neck: Normal range of motion. Neck supple. No JVD present. No thyromegaly present.  Cardiovascular: Normal rate, regular rhythm, normal heart sounds and intact distal pulses.  Exam reveals no gallop and no friction rub.   No murmur heard. Pulmonary/Chest: Effort normal and breath sounds normal. No respiratory distress. She has no wheezes. She has no rales. She exhibits no tenderness.  Abdominal: Soft. Bowel sounds are normal. She exhibits no distension and no mass. There is no tenderness. There is no rebound and no guarding.  Musculoskeletal: Normal range of motion.  Lymphadenopathy:    She has no cervical adenopathy.  Neurological: She is alert and oriented to person, place, and time. She has normal strength. No cranial nerve deficit or sensory deficit. Coordination normal.  Normal rapid alternating movements, negative pronator drift.  Skin: Skin is warm and dry. She is not diaphoretic.  Psychiatric: Her speech is normal and behavior is normal. Thought content normal. Her mood appears anxious. Cognition and memory are impaired.  Nursing note and vitals reviewed.   ED Course  Procedures (including critical care time) Labs  Review Labs Reviewed  CBC WITH DIFFERENTIAL - Abnormal; Notable for the following:    WBC 10.6 (*)    Neutrophils Relative % 82 (*)    Neutro Abs 8.6 (*)    All other components within normal limits  COMPREHENSIVE METABOLIC PANEL - Abnormal; Notable for the following:    Sodium 136 (*)    Potassium 3.3 (*)    Chloride 93 (*)    Glucose, Bld 191 (*)    Alkaline Phosphatase 124 (*)    GFR calc non Af Amer 60 (*)    GFR calc Af Amer 70 (*)    Anion gap 22 (*)    All other components within normal limits  URINALYSIS, ROUTINE W REFLEX MICROSCOPIC - Abnormal; Notable for the following:    APPearance CLOUDY (*)    Leukocytes, UA SMALL (*)    All other components within normal limits  URINE MICROSCOPIC-ADD ON - Abnormal; Notable for the following:    Squamous Epithelial / LPF MANY (*)  All other components within normal limits  URINE CULTURE  PROTIME-INR    Imaging Review Ct Head Wo Contrast  12/14/2013   CLINICAL DATA:  Diffuse body numbness.  EXAM: CT HEAD WITHOUT CONTRAST  TECHNIQUE: Contiguous axial images were obtained from the base of the skull through the vertex without intravenous contrast.  COMPARISON:  CT scan dated 10/17/2012 and MRI the brain dated 10/18/2012  FINDINGS: No mass lesion. No midline shift. No acute hemorrhage or hematoma. No extra-axial fluid collections. No evidence of acute infarction. Brain parenchyma is normal. Osseous structures are normal.  IMPRESSION: Normal exam.   Electronically Signed   By: Geanie CooleyJim  Maxwell M.D.   On: 12/14/2013 21:15     EKG Interpretation   Date/Time:  Monday December 14 2013 20:35:29 EST Ventricular Rate:  78 PR Interval:  123 QRS Duration: 70 QT Interval:  388 QTC Calculation: 442 R Axis:   61 Text Interpretation:  Sinus rhythm Sinus rhythm T wave abnormality  Abnormal ekg Confirmed by Gerhard MunchLOCKWOOD, ROBERT  MD [phone]([id number]) on 12/14/2013  8:44:12 PM      MDM   Final diagnoses:  Numbness   Patient is a 68 year old female who  presents to emergency room for total body numbness. Physical exam reveals alert, oriented, and nontoxic-appearing female. There are no focal neurological or sensory deficits on exam. CBC reveals mild leukocytosis. CMP reveals mild hyper glycemia. PT INR is within normal limits. UA reveals small leukocytes, but there does appear to be epithelial cells in her urine. This urine is most likely contaminated, but will send urine off for culture. He had is negative for acute stroke. EKG is normal sinus rhythm with some T wave abnormalities. There appears to be no real cause of numbness at this time. Patient is stable for discharge at this time. I will have her follow-up with her primary care doctor and we'll give her referral to neurology for follow-up with neurology. Patient has been seen by and discussed with Dr. Jeraldine LootsLockwood who agrees with the above workup and plan. Patient to return for worsening weakness, sudden onset of headache that is the worse of her life, or any other concerning symptoms. Patient states understanding and agreement. Patient is stable for discharge.   Eben Burowourtney A Forcucci, PA-C 12/14/13 2230  Gerhard Munchobert Lockwood, MD 12/15/13 [phone]

## 2013-12-14 NOTE — ED Notes (Signed)
Pt back from CT

## 2013-12-14 NOTE — ED Notes (Signed)
Per EMS, Pt began experiencing full body numbness at 1600 today and it concerned her b/c of her hx of CVA. Pt was hypertensive upon EMS arrival at 220/100, but then calmed and last BP was 182/70. Pt missed atenolol yesterday and today.

## 2013-12-16 LAB — URINE CULTURE: Colony Count: 70000

## 2013-12-19 ENCOUNTER — Emergency Department (HOSPITAL_COMMUNITY)
Admission: EM | Admit: 2013-12-19 | Discharge: 2013-12-19 | Disposition: A | Discharge status: Home / Self Care | Payer: PRIVATE HEALTH INSURANCE | Attending: Emergency Medicine | Admitting: Emergency Medicine

## 2013-12-19 ENCOUNTER — Emergency Department (HOSPITAL_COMMUNITY): Payer: PRIVATE HEALTH INSURANCE

## 2013-12-19 ENCOUNTER — Encounter (HOSPITAL_COMMUNITY): Payer: Self-pay | Admitting: Emergency Medicine

## 2013-12-19 DIAGNOSIS — R51 Headache: Secondary | ICD-10-CM | POA: Diagnosis not present

## 2013-12-19 DIAGNOSIS — I10 Essential (primary) hypertension: Secondary | ICD-10-CM | POA: Diagnosis not present

## 2013-12-19 DIAGNOSIS — E119 Type 2 diabetes mellitus without complications: Secondary | ICD-10-CM | POA: Diagnosis not present

## 2013-12-19 DIAGNOSIS — Z8673 Personal history of transient ischemic attack (TIA), and cerebral infarction without residual deficits: Secondary | ICD-10-CM | POA: Insufficient documentation

## 2013-12-19 DIAGNOSIS — R079 Chest pain, unspecified: Secondary | ICD-10-CM | POA: Insufficient documentation

## 2013-12-19 DIAGNOSIS — Z8719 Personal history of other diseases of the digestive system: Secondary | ICD-10-CM | POA: Diagnosis not present

## 2013-12-19 DIAGNOSIS — R200 Anesthesia of skin: Secondary | ICD-10-CM | POA: Diagnosis not present

## 2013-12-19 DIAGNOSIS — Z79899 Other long term (current) drug therapy: Secondary | ICD-10-CM | POA: Insufficient documentation

## 2013-12-19 DIAGNOSIS — E876 Hypokalemia: Secondary | ICD-10-CM | POA: Insufficient documentation

## 2013-12-19 DIAGNOSIS — Z9119 Patient's noncompliance with other medical treatment and regimen: Secondary | ICD-10-CM | POA: Diagnosis not present

## 2013-12-19 DIAGNOSIS — Z9114 Patient's other noncompliance with medication regimen: Secondary | ICD-10-CM

## 2013-12-19 DIAGNOSIS — D649 Anemia, unspecified: Secondary | ICD-10-CM | POA: Diagnosis not present

## 2013-12-19 LAB — CBC
HCT: 36.2 % (ref 36.0–46.0)
Hemoglobin: 11.7 g/dL — ABNORMAL LOW (ref 12.0–15.0)
MCH: 30 pg (ref 26.0–34.0)
MCHC: 32.3 g/dL (ref 30.0–36.0)
MCV: 92.8 fL (ref 78.0–100.0)
Platelets: 254 10*3/uL (ref 150–400)
RBC: 3.9 MIL/uL (ref 3.87–5.11)
RDW: 13 % (ref 11.5–15.5)
WBC: 7.9 10*3/uL (ref 4.0–10.5)

## 2013-12-19 LAB — BASIC METABOLIC PANEL
Anion gap: 17 — ABNORMAL HIGH (ref 5–15)
BUN: 9 mg/dL (ref 6–23)
CO2: 25 mEq/L (ref 19–32)
Calcium: 9.4 mg/dL (ref 8.4–10.5)
Chloride: 100 mEq/L (ref 96–112)
Creatinine, Ser: 1.09 mg/dL (ref 0.50–1.10)
GFR calc Af Amer: 60 mL/min — ABNORMAL LOW (ref >90)
GFR calc non Af Amer: 52 mL/min — ABNORMAL LOW (ref >90)
Glucose, Bld: 133 mg/dL — ABNORMAL HIGH (ref 70–99)
Potassium: 3.2 mEq/L — ABNORMAL LOW (ref 3.7–5.3)
Sodium: 142 mEq/L (ref 137–147)

## 2013-12-19 LAB — I-STAT TROPONIN, ED: Troponin i, poc: 0 ng/mL (ref 0.00–0.08)

## 2013-12-19 LAB — TROPONIN I: Troponin I: <0.3 ng/mL (ref <0.30)

## 2013-12-19 MED ORDER — MORPHINE SULFATE 2 MG/ML IJ SOLN
1.0000 mg | Freq: Once | INTRAMUSCULAR | Status: AC
  Administered 2013-12-19: 1 mg via INTRAVENOUS
  Filled 2013-12-19: qty 1

## 2013-12-19 MED ORDER — TRAMADOL HCL 50 MG PO TABS: 50.0000 mg | ORAL_TABLET | Freq: Four times a day (QID) | ORAL | Status: DC | PRN

## 2013-12-19 MED ORDER — ATENOLOL 50 MG PO TABS
100.0000 mg | ORAL_TABLET | Freq: Once | ORAL | Status: AC
  Administered 2013-12-19: 100 mg via ORAL
  Filled 2013-12-19: qty 2

## 2013-12-19 MED ORDER — POTASSIUM CHLORIDE CRYS ER 20 MEQ PO TBCR
40.0000 meq | EXTENDED_RELEASE_TABLET | Freq: Once | ORAL | Status: AC
  Administered 2013-12-19: 40 meq via ORAL
  Filled 2013-12-19: qty 2

## 2013-12-19 NOTE — ED Notes (Signed)
Pt. Denies any pain. Pt. Reports that her complete body is numb

## 2013-12-19 NOTE — ED Provider Notes (Signed)
CSN: [id number]     Arrival date & time 12/19/13  0547 History   First MD Initiated Contact with Patient 12/19/13 [phone]     Chief Complaint  Patient presents with  . Chest Pain   HPI  Patient is a 68 year old female who presents to the emergency room via EMS for complaints of chest pain, total body numbness, and high blood pressure. Patient states that for the past week she has been feeling bad and has had intermittent headaches, worsening left sided numbness which she has had since her previous stroke, and this morning she developed chest pain with numbness to the left shoulder. Patient was at home when her chest pain began. She is unable to give me a very good description of her chest pain. She also states that she has a headache at this time. She says the headache preceded the nitroglycerin tablets that she is given by EMS. Patient states that she has been out of her normal blood pressure medications as she does not have money to get them refilled. Patient normally takes atenolol chlorthalidone combination. Patient was found to be very hypertensive per EMS on their arrival. Patient was given 325 mg of aspirin and 2 nitroglycerin sublingual tablets by EMS which brought her pressure down slightly. Patient states that the last time that she took her blood pressure medication was over one week ago. Patient is currently having her eldest daughter stay with her from her previous visit from the ER on 12/14/2013. Patient came in at that time for total body numbness. Workup was negative at that time. Patient was discharged home to follow up with her PCP which she has not done. Chart review reveals negative left heart cath in October 2015. At that time she had normal coronary arteries and normal LV function.  Past Medical History  Diagnosis Date  . Diabetes mellitus without complication   . Hypertension   . Stroke   . GERD 09/04/2006    Qualifier: Diagnosis of  By: Duke SalviaGore, Denise    . History of  echocardiogram     a. 2D ECHO: 11/06/2013 EF 65%; no WMA. Mild TR. PA pk pressure 43 mm HG   Past Surgical History  Procedure Laterality Date  . Abdominal hysterectomy     No family history on file. History  Substance Use Topics  . Smoking status: Never Smoker   . Smokeless tobacco: Not on file  . Alcohol Use: No   OB History    No data available     Review of Systems  Constitutional: Positive for fatigue. Negative for fever and chills.  Respiratory: Negative for cough, chest tightness, shortness of breath and wheezing.   Cardiovascular: Positive for chest pain. Negative for palpitations and leg swelling.  Gastrointestinal: Negative for nausea, vomiting, abdominal pain, diarrhea and constipation.  Neurological: Positive for weakness, numbness and headaches. Negative for dizziness, seizures, syncope, speech difficulty and light-headedness.  All other systems reviewed and are negative.     Allergies  Codeine and Ibuprofen  Home Medications   Prior to Admission medications   Medication Sig Start Date End Date Taking? Authorizing Provider  ALPRAZolam (XANAX) 0.25 MG tablet Take 1 tablet (0.25 mg total) by mouth at bedtime as needed for sleep. 05/12/13  Yes Gavin PoundMichael Y. Ghim, MD  amLODipine (NORVASC) 5 MG tablet Take 1 tablet (5 mg total) by mouth daily. 11/06/13  Yes Janetta HoraKathryn R Thompson, PA-C  atenolol-chlorthalidone (TENORETIC) 100-25 MG per tablet Take 1 tablet by mouth daily.   Yes  Historical Provider, MD  atorvastatin (LIPITOR) 10 MG tablet Take 1 tablet (10 mg total) by mouth daily at 6 PM. 06/27/12  Yes Mcarthur Rossettianiel J Angiulli, PA-C  cloNIDine (CATAPRES) 0.2 MG tablet Take 0.2 mg by mouth 2 (two) times daily.   Yes Historical Provider, MD  clopidogrel (PLAVIX) 75 MG tablet Take 75 mg by mouth daily.   Yes Historical Provider, MD  diphenhydramine-acetaminophen (TYLENOL PM) 25-500 MG TABS Take 1 tablet by mouth at bedtime as needed (FOR SLEEP).   Yes Historical Provider, MD  ezetimibe  (ZETIA) 10 MG tablet Take 10 mg by mouth daily.   Yes Historical Provider, MD  glipiZIDE-metformin (METAGLIP) 5-500 MG per tablet Take 1 tablet by mouth at bedtime. 11/07/13  Yes Janetta HoraKathryn R Thompson, PA-C  hydrALAZINE (APRESOLINE) 100 MG tablet Take 50 mg by mouth 2 (two) times daily.    Yes Historical Provider, MD  nitroGLYCERIN (NITROSTAT) 0.4 MG SL tablet Place 1 tablet (0.4 mg total) under the tongue every 5 (five) minutes x 3 doses as needed for chest pain. 11/06/13  Yes Janetta HoraKathryn R Thompson, PA-C  traMADol (ULTRAM) 50 MG tablet Take 1 tablet (50 mg total) by mouth every 12 (twelve) hours as needed for moderate pain. 11/06/13  Yes Janetta HoraKathryn R Thompson, PA-C  famotidine (PEPCID) 40 MG tablet Take 40 mg by mouth daily.    Historical Provider, MD  losartan (COZAAR) 100 MG tablet Take 1 tablet (100 mg total) by mouth daily. Patient not taking: Reported on 12/19/2013 06/27/12   Mcarthur Rossettianiel J Angiulli, PA-C  traMADol (ULTRAM) 50 MG tablet Take 1 tablet (50 mg total) by mouth every 6 (six) hours as needed. 12/19/13   Telina Kleckley A Forcucci, PA-C   BP 170/104 mmHg  Pulse 79  Temp(Src) 98.3 F (36.8 C) (Oral)  Resp 22  Ht 5\' 6"  (1.676 m)  Wt 155 lb (70.308 kg)  BMI 25.03 kg/m2  SpO2 98% Physical Exam  Constitutional: She is oriented to person, place, and time. She appears well-developed and well-nourished. No distress.  HENT:  Head: Normocephalic and atraumatic.  Mouth/Throat: Oropharynx is clear and moist. No oropharyngeal exudate.  Eyes: Conjunctivae and EOM are normal. Pupils are equal, round, and reactive to light. No scleral icterus.  Neck: Normal range of motion. Neck supple. No JVD present. No thyromegaly present.  Cardiovascular: Normal rate, regular rhythm, normal heart sounds and intact distal pulses.  Exam reveals no gallop and no friction rub.   No murmur heard. Pulmonary/Chest: Effort normal and breath sounds normal. No respiratory distress. She has no wheezes. She has no rales. She exhibits no  tenderness.  Abdominal: Soft. Bowel sounds are normal. She exhibits no distension and no mass. There is no tenderness. There is no rebound and no guarding.  Musculoskeletal: Normal range of motion.  Lymphadenopathy:    She has no cervical adenopathy.  Neurological: She is alert and oriented to person, place, and time. She has normal strength. No cranial nerve deficit or sensory deficit. Coordination normal.  Normal rapid alternating movements. Negative pronator drift.  Skin: Skin is warm and dry. She is not diaphoretic.  Psychiatric: She has a normal mood and affect. Her behavior is normal. Judgment and thought content normal.  Nursing note and vitals reviewed.   ED Course  Procedures (including critical care time) Labs Review Labs Reviewed  CBC - Abnormal; Notable for the following:    Hemoglobin 11.7 (*)    All other components within normal limits  BASIC METABOLIC PANEL - Abnormal; Notable for  the following:    Potassium 3.2 (*)    Glucose, Bld 133 (*)    GFR calc non Af Amer 52 (*)    GFR calc Af Amer 60 (*)    Anion gap 17 (*)    All other components within normal limits  TROPONIN I  I-STAT TROPOININ, ED    Imaging Review Ct Head Wo Contrast  12/19/2013   CLINICAL DATA:  Headache with numbness and tingling or the patient's entire body since 12/14/2013. Hypertension.  EXAM: CT HEAD WITHOUT CONTRAST  TECHNIQUE: Contiguous axial images were obtained from the base of the skull through the vertex without intravenous contrast.  COMPARISON:  12/14/2013  FINDINGS: No mass lesion. No midline shift. No acute hemorrhage or hematoma. No extra-axial fluid collections. No evidence of acute infarction. Brain parenchyma is normal. Osseous structures are normal.  IMPRESSION: Normal exam.   Electronically Signed   By: Geanie Cooley M.D.   On: 12/19/2013 08:21   Dg Chest Port 1 View  12/19/2013   CLINICAL DATA:  Diffuse head and body numbness, acute onset. Tachycardia. Posterior neck pain.  Initial encounter.  EXAM: PORTABLE CHEST - 1 VIEW  COMPARISON:  Chest radiograph and CTA of the chest performed 11/04/2013  FINDINGS: The lungs are well-aerated and clear. There is no evidence of focal opacification, pleural effusion or pneumothorax.  The cardiomediastinal silhouette is borderline enlarged. No acute osseous abnormalities are seen.  IMPRESSION: Borderline cardiomegaly; no acute cardiopulmonary process seen.   Electronically Signed   By: Roanna Raider M.D.   On: 12/19/2013 06:46     EKG Interpretation   Date/Time:  Saturday December 19 2013 05:54:34 EST Ventricular Rate:  90 PR Interval:  124 QRS Duration: 68 QT Interval:  376 QTC Calculation: 460 R Axis:   72 Text Interpretation:  Sinus rhythm Ventricular premature complex  Borderline T abnormalities, anterior leads No significant change since  last tracing Confirmed by DOCHERTY  MD, MEGAN ([id number]) on 12/19/2013 6:06:19  AM      MDM   Final diagnoses:  Numbness  Chest pain, unspecified chest pain type  Essential hypertension  Non compliance w medication regimen   Patient is a 68 year old female who presents to the emergency room for total body numbness, chest pain, and headache. Physical exam reveals alert and oriented and nontoxic appearing female with normal sensation on examination. EKG is unchanged from prior tracing. I-STAT troponin is negative. BMP reveals mild hypokalemia with no other acute abnormalities. CBC reveals mild anemia which appears to be at baseline. Given complaint of numbness head CT was performed as patient feels that she possibly was having a stroke. Head CT is negative at this time. Patient is currently out of blood pressure medications. She was treated here in the ED with her normal home medicine of atenolol. I have told the patient that she needs to continue to take her medicines as prescribed. She needs to go to the pharmacy and get her medications filled. She is asking to have them refilled here  which we not do. Patient's daughter states understanding to the above plan. Patient is to follow-up with her doctor. She states that she will do this. Patient was seen by and discussed with Dr. Micheline Maze who states understanding and agreement to the above plan.    Eben Burow, PA-C 12/19/13 1610  Toy Cookey, MD 12/21/13 2136

## 2013-12-19 NOTE — ED Notes (Signed)
Patient from home where she has been experiencing chest pain and high blood pressure x1 week. Patient states pain is in the center of her chest and is a 10/10 numb pain. Denies n/v, diaphoresis, or SOB. Patient has hx of HTN, and has been out of her BP meds x2 weeks. EMS BP 219/96. EMS gave 2 SL nitro en route and BP came down to 163/78. Patient received 324 ASA PTA. HR 98 sinus rhythm. 100% RA.

## 2013-12-19 NOTE — Discharge Instructions (Signed)
Hypertension °Hypertension, commonly called high blood pressure, is when the force of blood pumping through your arteries is too strong. Your arteries are the blood vessels that carry blood from your heart throughout your body. A blood pressure reading consists of a higher number over a lower number, such as 110/72. The higher number (systolic) is the pressure inside your arteries when your heart pumps. The lower number (diastolic) is the pressure inside your arteries when your heart relaxes. Ideally you want your blood pressure below 120/80. °Hypertension forces your heart to work harder to pump blood. Your arteries may become narrow or stiff. Having hypertension puts you at risk for heart disease, stroke, and other problems.  °RISK FACTORS °Some risk factors for high blood pressure are controllable. Others are not.  °Risk factors you cannot control include:  °· Race. You may be at higher risk if you are African American. °· Age. Risk increases with age. °· Gender. Men are at higher risk than women before age 45 years. After age 65, women are at higher risk than men. °Risk factors you can control include: °· Not getting enough exercise or physical activity. °· Being overweight. °· Getting too much fat, sugar, calories, or salt in your diet. °· Drinking too much alcohol. °SIGNS AND SYMPTOMS °Hypertension does not usually cause signs or symptoms. Extremely high blood pressure (hypertensive crisis) may cause headache, anxiety, shortness of breath, and nosebleed. °DIAGNOSIS  °To check if you have hypertension, your health care provider will measure your blood pressure while you are seated, with your arm held at the level of your heart. It should be measured at least twice using the same arm. Certain conditions can cause a difference in blood pressure between your right and left arms. A blood pressure reading that is higher than normal on one occasion does not mean that you need treatment. If one blood pressure reading  is high, ask your health care provider about having it checked again. °TREATMENT  °Treating high blood pressure includes making lifestyle changes and possibly taking medicine. Living a healthy lifestyle can help lower high blood pressure. You may need to change some of your habits. °Lifestyle changes may include: °· Following the DASH diet. This diet is high in fruits, vegetables, and whole grains. It is low in salt, red meat, and added sugars. °· Getting at least 2½ hours of brisk physical activity every week. °· Losing weight if necessary. °· Not smoking. °· Limiting alcoholic beverages. °· Learning ways to reduce stress. ° If lifestyle changes are not enough to get your blood pressure under control, your health care provider may prescribe medicine. You may need to take more than one. Work closely with your health care provider to understand the risks and benefits. °HOME CARE INSTRUCTIONS °· Have your blood pressure rechecked as directed by your health care provider.   °· Take medicines only as directed by your health care provider. Follow the directions carefully. Blood pressure medicines must be taken as prescribed. The medicine does not work as well when you skip doses. Skipping doses also puts you at risk for problems.   °· Do not smoke.   °· Monitor your blood pressure at home as directed by your health care provider.  °SEEK MEDICAL CARE IF:  °· You think you are having a reaction to medicines taken. °· You have recurrent headaches or feel dizzy. °· You have swelling in your ankles. °· You have trouble with your vision. °SEEK IMMEDIATE MEDICAL CARE IF: °· You develop a severe headache or confusion. °·   You have unusual weakness, numbness, or feel faint. °· You have severe chest or abdominal pain. °· You vomit repeatedly. °· You have trouble breathing. °MAKE SURE YOU:  °· Understand these instructions. °· Will watch your condition. °· Will get help right away if you are not doing well or get worse. °Document  Released: 01/08/2005 Document Revised: 05/25/2013 Document Reviewed: 10/31/2012 °ExitCare® Patient Information ©2015 [employer]. This information is not intended to replace advice given to you by your health care provider. Make sure you discuss any questions you have with your health care provider. ° ° °Emergency Department Resource Guide °1) Find a Doctor and Pay Out of Pocket °Although you won't have to find out who is covered by your insurance plan, it is a good idea to ask around and get recommendations. You will then need to call the office and see if the doctor you have chosen will accept you as a new patient and what types of options they offer for patients who are self-pay. Some doctors offer discounts or will set up payment plans for their patients who do not have insurance, but you will need to ask so you aren't surprised when you get to your appointment. ° °2) Contact Your Local Health Department °Not all health departments have doctors that can see patients for sick visits, but many do, so it is worth a call to see if yours does. If you don't know where your local health department is, you can check in your phone book. The CDC also has a tool to help you locate your state's health department, and many state websites also have listings of all of their local health departments. ° °3) Find a Walk-in Clinic °If your illness is not likely to be very severe or complicated, you may want to try a walk in clinic. These are popping up all over the country in pharmacies, drugstores, and shopping centers. They're usually staffed by nurse practitioners or physician assistants that have been trained to treat common illnesses and complaints. They're usually fairly quick and inexpensive. However, if you have serious medical issues or chronic medical problems, these are probably not your best option. ° °No Primary Care Doctor: °- Call [facility] at  832-8000 - they can help you locate a primary care doctor that   accepts your insurance, provides certain services, etc. °- Physician Referral Service- 1-800-533-3463 ° °Chronic Pain Problems: °Organization         Address  Phone   Notes  °[facility]  (336) 297-2271 Patients need to be referred by their primary care doctor.  ° °Medication Assistance: °Organization         Address  Phone   Notes  °[place] [facility] [address] °[place], [place] [address] (336) 641-8030 --Must be a resident of [place] °-- Must have NO insurance coverage whatsoever (no Medicaid/ Medicare, etc.) °-- The pt. MUST have a primary care doctor that directs their care regularly and follows them in the community °  °[facility]  (866) 331-1348   °[employer]  (888) 892-1162   ° °Agencies that provide inexpensive medical care: °Organization         Address  Phone   Notes  °[facility]  (336) 832-8035   °[facility]    (336) 832-7272   °[facility] [address] °[place], [place] [address] (336) 832-4777   °[facility] [address], °  [place] (336) 271-4999   °[facility]    (336) 373-0678   °[facility]    (336) 272-1050   °[facility] ° [address], [place] Phone:  (336) 832-4444, Fax:  (336) 832-4440 Hours of Operation:  9 am - 6 pm, M-F.  Also accepts Medicaid/Medicare and self-pay.  °[facility] ° [address], [place] Phone: (336) 832-3150, Fax: (336) 832-3151. Hours of Operation:  8:30 am - 5:30 pm, M-F.  Also accepts Medicaid and self-pay.  °[facility] [place] [address], [place] Phone: (336) 878-6027   °[facility] [address], [place], [place] (336)723-1848, Ext. 123 Mondays & Thursdays: 7-9 AM.  First 15 patients are seen on a first come, first serve basis. °  ° °Medicaid-accepting [place] Providers: ° °Organization          Address  Phone   Notes  °[facility] [address], [place] (336) 641-2100 Also accepts self-pay patients.  °[facility] [address], [place] ° (336) 856-9996   °[facility] [address], [place] (336) 288-8857   °[facility] [address] [place] [address], [place] (336) 299-7000   °Veita Bland [address], [place]  ° (336) 373-1557 Only accepts [place] Access Medicaid patients after they have their name applied to their card.  ° °Self-Pay (no insurance) in [place]: ° °Organization         Address  Phone   Notes  °Sickle Cell Patients, [facility] [address], [place] (336) 832-1970   °[facility] [address], [place] (336) 832-4400   °[facility] ° [address], [place] (336) 992-4800   °[facility]/Dr. Osei-Bonsu ° [address] [place] [address], [place] or [address], [place] (336) 841-8500 Phone number for both [place] and [place] locations is the same.  °[facility] [address], [place] (336) 299-0000   °[facility] [address] [place] [address], [place] or [address] (336) 852-7530 °(336) 878-2260   °[facility] [address],  (336) 350-1642, phone; (336) 294-5005, fax Sees patients 1st and 3rd Saturday of every month.  Must not qualify for public or private insurance (i.e. Medicaid, Medicare, [place] Health Choice, Veterans' Benefits) • Household income should be no more than 200% of the poverty level •The clinic cannot treat you if you are pregnant or think you are pregnant • Sexually transmitted diseases are not treated at the clinic.  ° ° °Dental Care: °Organization         Address  Phone  Notes  °[facility] [address],   (336) 641-6152 Accepts children up to age 21 who are enrolled in Medicaid or [place] Health Choice; pregnant women with a Medicaid card; and children who have applied for Medicaid or [place] Health Choice, but were declined, whose parents can pay a reduced fee at time of service.  °[facility] [place]  [address], [place] (336) 641-7733 Accepts children up to age 21 who are enrolled in Medicaid or [place] Health Choice; pregnant women with a Medicaid card; and children who have applied for Medicaid or [place]   Health Choice, but were declined, whose parents can pay a reduced fee at time of service.  °[facility] ° [address], [place] (336) 641-4533 Patients are seen by appointment only. Walk-ins are not accepted. [facility] will see patients 18 years of age and older. °Monday - Tuesday (8am-5pm) °Most Wednesdays (8:30-5pm) °$30 per visit, cash only  °[facility] ° [address], [place] (336) 641-4533 Patients are seen by appointment only. Walk-ins are not accepted. [facility] will see patients 18 years of age and older. °One Wednesday Evening (Monthly: Volunteer Based).  $30 per visit, cash only  °[facility]  (919) 537-3737 for adults; Children under age 4, call Graduate Pediatric Dentistry at ([phone]) [phone]. Children aged 4-14, please call ([phone]) [phone] to request a pediatric application. ° Dental services are provided in all areas of dental care including fillings, crowns and bridges, complete and partial dentures, implants, gum treatment, root canals, and extractions. Preventive care is also provided. Treatment is provided to both adults and children. °Patients are selected via a lottery and there is often a waiting list. °  °[facility] [address], °[place] ° (336) 763-8833 [email or web address] °  °[facility] [address], [place], [place]  (336)723-1848, Ext. 123 Second and Fourth Thursday of each month, opens at 6:30 AM; Clinic ends at 9 AM.  Patients are seen on a first-come first-served basis, and a limited number are seen during each clinic.  ° °[facility] ° [address], [place], [place] (336) 723-7904   Eligibility Requirements °You must have lived in [place], [place], or [place] counties for at least the last three months. °  You cannot be eligible for state or federal sponsored healthcare insurance, including Veterans Administration, Medicaid, or Medicare. °  You generally cannot be eligible for healthcare insurance through your employer.  °  How to apply: °Eligibility screenings are held every Tuesday and Wednesday afternoon from 1:00 pm until 4:00 pm. You do not need an appointment for the interview!  °[facility] [address], [place], [place] 336-631-2330   °[facility]  336-342-8273   °[facility]  336-703-3100   °[facility]  336-570-6415   ° °Behavioral Health Resources in the Community: °Intensive Outpatient Programs °Organization         Address  Phone  Notes  °[facility] [address], [place], [place] 336-878-6098   °[facility] [address], [place], [place] 336-832-9800   °[facility] [address], [place], [place] ° 336-882-2125   °[facility] [address],  °[place], [place] 1-800-853-5163 or 336-641-4981   °Substance Abuse Resources °Organization         Address  Phone  Notes  °[facility]  336-882-2125   °[facility]  336-784-9470   °[facility]  336-285-9073   °[facility]  336-845-3988   °Residential & Outpatient Substance Abuse Program  1-800-659-3381   °Psychological Services °Organization         Address  Phone  Notes  °[facility]  336- 832-9600   °[facility]  336- 378-7881    °[facility] [address], [place] 1-800-853-5163 or 336-641-4981   ° °Mobile Crisis Teams °Organization         Address  Phone    Notes  °[facility], Mobile Crisis Care Unit  1-877-626-1772   °Assertive °[facility] ° [address] [place], [place] 336-834-9664   °Sharon DeEsch [address] °[place] 336-554-5454   ° °Self-Help/Support Groups °Organization         Address  Phone             Notes  °[facility] [place] - variety of support groups  336- 373-1402 Call for more information  °Narcotics Anonymous (NA), [facility] [address], °[place]  2 meetings at this location  ° °Residential Treatment Programs °Organization         Address  Phone  Notes  °[facility] [address],    °[place]  1-866-801-8205   °[facility] ° [address], [place], [place] 704-293-8524   °[facility] [address], [place] 336-845-3988 Admissions: 8am-3pm M-F  °[facility] [address]    °[place], [place] 336-841-1104   °[facility] [address], [place], [place] 336-379-7146   °[facility] [address]  °[place], [place] 336-285-9073   °[facility] Intensive Outpatient [address], [place], [place] 336-852-3033   °[facility] [address]  °[place], [place] 1-877-615-2722 or 336-784-9470   °[facility] ([facility] [address] [place], [place] 336-227-7417 Accepts Medicaid  °[facility] [address]  °[place] 1-800-659-3381 Substance Abuse/Addiction Treatment  ° °[place] Behavioral Health Resources °Organization         Address  Phone  Notes  °[facility]  (888) 581-9988   °Julie Brannon, PhD [address] [place], [place]   (336) 349-5553 or (336) 951-0000   °[facility]   [address] °[place], [place] (336) 349-4454   °[facility] [address], [place], [place] (336) 342-8316 Insurance/Medicaid/sponsorship through [facility]  °[facility] [address]                                    [place],  (336) 342-8316 Therapy/tele-psych/case  °[facility] [address]  ° [place],  (336) 349-2233    °Dr. Arfeen  (336) 349-4544   °[facility] [place]  [employer] [place] [facility] 1) [address], [place] °2) [address], [place] °3)  [address], [place] (336) 349-3220 °(336) 342-7768 ° °(336) 342-8140   °[place] [facility] (336) 342-1394 or (336) 342-3537 (After Hours)    ° ° ° °

## 2013-12-31 ENCOUNTER — Encounter (HOSPITAL_COMMUNITY): Payer: Self-pay | Admitting: Cardiovascular Disease

## 2014-05-26 ENCOUNTER — Emergency Department (HOSPITAL_COMMUNITY)
Admission: EM | Admit: 2014-05-26 | Discharge: 2014-05-26 | Disposition: A | Discharge status: Home / Self Care | Payer: Medicare Other | Attending: Emergency Medicine | Admitting: Emergency Medicine

## 2014-05-26 ENCOUNTER — Encounter (HOSPITAL_COMMUNITY): Payer: Self-pay | Admitting: *Deleted

## 2014-05-26 DIAGNOSIS — M79605 Pain in left leg: Secondary | ICD-10-CM

## 2014-05-26 DIAGNOSIS — W1839XA Other fall on same level, initial encounter: Secondary | ICD-10-CM | POA: Insufficient documentation

## 2014-05-26 DIAGNOSIS — Z7901 Long term (current) use of anticoagulants: Secondary | ICD-10-CM | POA: Diagnosis not present

## 2014-05-26 DIAGNOSIS — Z8673 Personal history of transient ischemic attack (TIA), and cerebral infarction without residual deficits: Secondary | ICD-10-CM | POA: Diagnosis not present

## 2014-05-26 DIAGNOSIS — Y998 Other external cause status: Secondary | ICD-10-CM | POA: Diagnosis not present

## 2014-05-26 DIAGNOSIS — I10 Essential (primary) hypertension: Secondary | ICD-10-CM | POA: Insufficient documentation

## 2014-05-26 DIAGNOSIS — Y92009 Unspecified place in unspecified non-institutional (private) residence as the place of occurrence of the external cause: Secondary | ICD-10-CM | POA: Diagnosis not present

## 2014-05-26 DIAGNOSIS — Y9302 Activity, running: Secondary | ICD-10-CM | POA: Insufficient documentation

## 2014-05-26 DIAGNOSIS — Z8719 Personal history of other diseases of the digestive system: Secondary | ICD-10-CM | POA: Insufficient documentation

## 2014-05-26 DIAGNOSIS — E119 Type 2 diabetes mellitus without complications: Secondary | ICD-10-CM | POA: Insufficient documentation

## 2014-05-26 DIAGNOSIS — Z79899 Other long term (current) drug therapy: Secondary | ICD-10-CM | POA: Insufficient documentation

## 2014-05-26 DIAGNOSIS — S8992XA Unspecified injury of left lower leg, initial encounter: Secondary | ICD-10-CM | POA: Insufficient documentation

## 2014-05-26 NOTE — Discharge Instructions (Signed)
Use tylenol and motrin as needed for pain. Use ice and heat to the affected areas, no more than 20 minutes at a time every hour. Follow up with your regular doctor in 1 week for recheck of symptoms. Return to the ER for changes or worsening symptoms.   Musculoskeletal Pain Musculoskeletal pain is muscle and boney aches and pains. These pains can occur in any part of the body. Your caregiver may treat you without knowing the cause of the pain. They may treat you if blood or urine tests, X-rays, and other tests were normal.  CAUSES There is often not a definite cause or reason for these pains. These pains may be caused by a type of germ (virus). The discomfort may also come from overuse. Overuse includes working out too hard when your body is not fit. Boney aches also come from weather changes. Bone is sensitive to atmospheric pressure changes. HOME CARE INSTRUCTIONS   Ask when your test results will be ready. Make sure you get your test results.  Only take over-the-counter or prescription medicines for pain, discomfort, or fever as directed by your caregiver. If you were given medications for your condition, do not drive, operate machinery or power tools, or sign legal documents for 24 hours. Do not drink alcohol. Do not take sleeping pills or other medications that may interfere with treatment.  Continue all activities unless the activities cause more pain. When the pain lessens, slowly resume normal activities. Gradually increase the intensity and duration of the activities or exercise.  During periods of severe pain, bed rest may be helpful. Lay or sit in any position that is comfortable.  Putting ice on the injured area.  Put ice in a bag.  Place a towel between your skin and the bag.  Leave the ice on for 15 to 20 minutes, 3 to 4 times a day.  Follow up with your caregiver for continued problems and no reason can be found for the pain. If the pain becomes worse or does not go away, it  may be necessary to repeat tests or do additional testing. Your caregiver may need to look further for a possible cause. SEEK IMMEDIATE MEDICAL CARE IF:  You have pain that is getting worse and is not relieved by medications.  You develop chest pain that is associated with shortness or breath, sweating, feeling sick to your stomach (nauseous), or throw up (vomit).  Your pain becomes localized to the abdomen.  You develop any new symptoms that seem different or that concern you. MAKE SURE YOU:   Understand these instructions.  Will watch your condition.  Will get help right away if you are not doing well or get worse. Document Released: 01/08/2005 Document Revised: 04/02/2011 Document Reviewed: 09/12/2012 [facility] Patient Information 2015 [place]Care, Maryland[place]. This information is not intended to replace advice given to you by your health care provider. Make sure you discuss any questions you have with your health care provider.  Heat Therapy Heat therapy can help ease sore, stiff, injured, and tight muscles and joints. Heat relaxes your muscles, which may help ease your pain.  RISKS AND COMPLICATIONS If you have any of the following conditions, do not use heat therapy unless your health care provider has approved:  Poor circulation.  Healing wounds or scarred skin in the area being treated.  Diabetes, heart disease, or high blood pressure.  Not being able to feel (numbness) the area being treated.  Unusual swelling of the area being treated.  Active  infections.  Blood clots.  Cancer.  Inability to communicate pain. This may include young children and people who have problems with their brain function (dementia).  Pregnancy. Heat therapy should only be used on old, pre-existing, or long-lasting (chronic) injuries. Do not use heat therapy on new injuries unless directed by your health care provider. HOW TO USE HEAT THERAPY There are several different kinds of heat therapy,  including:  Moist heat pack.  Warm water bath.  Hot water bottle.  Electric heating pad.  Heated gel pack.  Heated wrap.  Electric heating pad. Use the heat therapy method suggested by your health care provider. Follow your health care provider's instructions on when and how to use heat therapy. GENERAL HEAT THERAPY RECOMMENDATIONS  Do not sleep while using heat therapy. Only use heat therapy while you are awake.  Your skin may turn pink while using heat therapy. Do not use heat therapy if your skin turns red.  Do not use heat therapy if you have new pain.  High heat or long exposure to heat can cause burns. Be careful when using heat therapy to avoid burning your skin.  Do not use heat therapy on areas of your skin that are already irritated, such as with a rash or sunburn. SEEK MEDICAL CARE IF:  You have blisters, redness, swelling, or numbness.  You have new pain.  Your pain is worse. MAKE SURE YOU:  Understand these instructions.  Will watch your condition.  Will get help right away if you are not doing well or get worse. Document Released: 04/02/2011 Document Revised: 05/25/2013 Document Reviewed: 03/03/2013 [facility] Patient Information 2015 [place]ExitCare, [place]LLC. This information is not intended to replace advice given to you by your health care provider. Make sure you discuss any questions you have with your health care provider.

## 2014-05-26 NOTE — ED Provider Notes (Signed)
CSN: [id number]     Arrival date & time 05/26/14  1800 History  This chart was scribed for [employer], PA-C, working with Bethann Berkshire, MD by Chestine Spore, ED Scribe. The patient was seen in room TR10C/TR10C at 6:27 PM.    Chief Complaint  Patient presents with  . Fall  . Leg Pain      Patient is a 68 y.o. female presenting with fall and leg pain. The history is provided by the patient. No language interpreter was used.  Fall This is a new problem. The current episode started 1 to 2 hours ago. The problem occurs rarely. The problem has not changed since onset.Pertinent negatives include no chest pain, no abdominal pain, no headaches and no shortness of breath. The symptoms are aggravated by walking. Relieved by: none tried. She has tried nothing for the symptoms. Improvement on treatment: none tried.  Leg Pain Location:  Leg Time since incident:  2 hours Injury: yes   Mechanism of injury: fall   Fall:    Fall occurred:  Running   Impact surface:  Primary school teacher of impact:  Knees   Entrapped after fall: no   Leg location:  L lower leg Pain details:    Quality:  Unable to specify   Radiates to:  Does not radiate   Severity:  Mild   Duration:  1 hour   Timing:  Constant   Progression:  Unchanged Chronicity:  New Dislocation: no   Foreign body present:  No foreign bodies Tetanus status:  Unknown Prior injury to area:  No Relieved by:  None tried Worsened by:  Bearing weight Ineffective treatments:  None tried Associated symptoms: stiffness   Associated symptoms: no back pain, no decreased ROM, no fever, no itching, no muscle weakness, no neck pain, no numbness, no swelling and no tingling     [name] is a 69 y.o. female with a medical hx of HTN, GERD, and DM2, who presents to the Emergency department complaining of mechical fall onset today PTA. Pt notes that she was running across the [place] with her granddaughter when she had a leg cramp and she fell onto  the ground when she made it across the [place]. Pt reports that her leg locked up when she fell. Pt denies tripping and she notes that her leg "gave out". Pt c/o left leg pain that is a 10/10, constant, tight feeling, located from the thigh down to the calf, and it does not radiate. Ambulating makes her left leg pain worse. She states that she has not tried any medications for the relief of her symptoms. She states that she is having associated symptoms of subjective joint swelling. Pt reports that she has had left sided tingling since she had her stroke in the past, and that this is unchanged.  She denies dizziness, lightheadedness, back pain, hip pain, CP, SOB, hitting head, LOC, abdominal pain, bruising/cuts, numbness, new tingling, weakness, left ankle or hip pain, n/v, cauda equina symptoms, HA, blurred vision, and any other symptoms. Took her BP meds earlier today. Denies skin changes to the leg, no erythema or warmth.   Past Medical History  Diagnosis Date  . Diabetes mellitus without complication   . Hypertension   . Stroke   . GERD 09/04/2006    Qualifier: Diagnosis of  By: Duke Salvia    . History of echocardiogram     a. 2D ECHO: 11/06/2013 EF 65%; no WMA. Mild TR. PA pk pressure 43 mm  HG   Past Surgical History  Procedure Laterality Date  . Abdominal hysterectomy    . Left heart catheterization with coronary angiogram N/A 11/05/2013    Procedure: LEFT HEART CATHETERIZATION WITH CORONARY ANGIOGRAM;  Surgeon: Lennette Bihari, MD;  Location: [facility] CATH LAB;  Service: Cardiovascular;  Laterality: N/A;   History reviewed. No pertinent family history. History  Substance Use Topics  . Smoking status: Never Smoker   . Smokeless tobacco: Not on file  . Alcohol Use: No   OB History    No data available     Review of Systems  Constitutional: Negative for fever and chills.  Eyes: Negative for visual disturbance.  Respiratory: Negative for shortness of breath.   Cardiovascular: Negative  for chest pain and leg swelling.  Gastrointestinal: Negative for nausea, vomiting, abdominal pain and diarrhea.  Musculoskeletal: Positive for joint swelling (subjective), arthralgias (lower left leg pain) and stiffness. Negative for myalgias, back pain and neck pain.  Skin: Negative for color change, itching and wound.  Neurological: Negative for dizziness, syncope, weakness, light-headedness, numbness and headaches.   A complete 10 system review of systems was obtained and all systems are negative except as noted in the HPI and PMH.     Allergies  Codeine and Ibuprofen  Home Medications   Prior to Admission medications   Medication Sig Start Date End Date Taking? Authorizing Provider  ALPRAZolam (XANAX) 0.25 MG tablet Take 1 tablet (0.25 mg total) by mouth at bedtime as needed for sleep. 05/12/13   Quita Skye, MD  amLODipine (NORVASC) 5 MG tablet Take 1 tablet (5 mg total) by mouth daily. 11/06/13   Janetta Hora, PA-C  atenolol-chlorthalidone (TENORETIC) 100-25 MG per tablet Take 1 tablet by mouth daily.    Historical Provider, MD  atorvastatin (LIPITOR) 10 MG tablet Take 1 tablet (10 mg total) by mouth daily at 6 PM. 06/27/12   Mcarthur Rossetti Angiulli, PA-C  cloNIDine (CATAPRES) 0.2 MG tablet Take 0.2 mg by mouth 2 (two) times daily.    Historical Provider, MD  clopidogrel (PLAVIX) 75 MG tablet Take 75 mg by mouth daily.    Historical Provider, MD  diphenhydramine-acetaminophen (TYLENOL PM) 25-500 MG TABS Take 1 tablet by mouth at bedtime as needed (FOR SLEEP).    Historical Provider, MD  ezetimibe (ZETIA) 10 MG tablet Take 10 mg by mouth daily.    Historical Provider, MD  famotidine (PEPCID) 40 MG tablet Take 40 mg by mouth daily.    Historical Provider, MD  glipiZIDE-metformin (METAGLIP) 5-500 MG per tablet Take 1 tablet by mouth at bedtime. 11/07/13   Janetta Hora, PA-C  hydrALAZINE (APRESOLINE) 100 MG tablet Take 50 mg by mouth 2 (two) times daily.     Historical Provider, MD   losartan (COZAAR) 100 MG tablet Take 1 tablet (100 mg total) by mouth daily. Patient not taking: Reported on 12/19/2013 06/27/12   Mcarthur Rossetti Angiulli, PA-C  nitroGLYCERIN (NITROSTAT) 0.4 MG SL tablet Place 1 tablet (0.4 mg total) under the tongue every 5 (five) minutes x 3 doses as needed for chest pain. 11/06/13   Janetta Hora, PA-C  traMADol (ULTRAM) 50 MG tablet Take 1 tablet (50 mg total) by mouth every 12 (twelve) hours as needed for moderate pain. 11/06/13   Janetta Hora, PA-C  traMADol (ULTRAM) 50 MG tablet Take 1 tablet (50 mg total) by mouth every 6 (six) hours as needed. 12/19/13   Courtney Forcucci, PA-C   BP 188/76 mmHg  Pulse 62  Temp(Src) 97.3 F (36.3 C) (Oral)  Resp 14  SpO2 100% Physical Exam  Constitutional: She is oriented to person, place, and time. Vital signs are normal. She appears well-developed and well-nourished.  Non-toxic appearance. No distress.  Afebrile, nontoxic, NAD. Mild HTN noted on exam, otherwise VSS  HENT:  Head: Normocephalic and atraumatic.  Mouth/Throat: Oropharynx is clear and moist and mucous membranes are normal.  Eyes: Conjunctivae and EOM are normal. Right eye exhibits no discharge. Left eye exhibits no discharge.  Neck: Normal range of motion. Neck supple.  Cardiovascular: Normal rate, regular rhythm, normal heart sounds and intact distal pulses.  Exam reveals no gallop and no friction rub.   No murmur heard. Pulmonary/Chest: Effort normal and breath sounds normal. No respiratory distress. She has no decreased breath sounds. She has no wheezes. She has no rhonchi. She has no rales.  Abdominal: Soft. Normal appearance. She exhibits no distension. There is no tenderness. There is no rigidity, no rebound and no guarding.  Musculoskeletal: Normal range of motion.       Left hip: Normal. She exhibits normal range of motion, normal strength, no tenderness, no swelling and no deformity.       Left knee: Normal. She exhibits normal range of  motion, no swelling and no deformity. No tenderness found.       Left ankle: Normal. She exhibits normal range of motion, no swelling, no deformity and normal pulse. No tenderness. Achilles tendon normal.       Cervical back: Normal.       Thoracic back: Normal.       Lumbar back: Normal.  MAE x4 Strength and sensation grossly intact Distal pulses intact No pedal edema, neg homan's bilaterally  Left hip, femur, knee, tibia, fibula, and ankle all non-TTP without bruising or swelling, no deformities. FROM intact at all joints. All spinal levels non-TTP with FROM.   Neurological: She is alert and oriented to person, place, and time. She has normal strength. No sensory deficit.  Skin: Skin is warm, dry and intact. No abrasion, no bruising and no rash noted.  No bruising or abrasions  Psychiatric: She has a normal mood and affect.  Nursing note and vitals reviewed.   ED Course  Procedures (including critical care time) DIAGNOSTIC STUDIES: Oxygen Saturation is 100% on RA, nl by my interpretation.    COORDINATION OF CARE: 6:38 PM-Discussed treatment plan which includes alternate tylenol and ibuprofen, use ice for 24 hours then change to heat, and f/u with PCP in 1 week with pt at bedside and pt agreed to plan.   Labs Review Labs Reviewed - No data to display  Imaging Review No results found.   EKG Interpretation None      MDM   Final diagnoses:  Left leg pain  Essential hypertension     69 y.o. female here with L leg pain after a mechanical fall. Neurovascularly intact with soft compartments. No bony or focal area of TTP, doubt need for imaging. Of note, pt has HTN here but is asymptomatic, therefore doubt need for further evaluation or tx of this here today. Will have her use tylenol/motrin for pain, and f/up with PCP in 1wk. Discussed Ice/heat use. I explained the diagnosis and have given explicit precautions to return to the ER including for any other new or worsening  symptoms. The patient understands and accepts the medical plan as it's been dictated and I have answered their questions. Discharge instructions concerning home care and prescriptions have been  given. The patient is STABLE and is discharged to home in good condition.   I personally performed the services described in this documentation, which was scribed in my presence. The recorded information has been reviewed and is accurate.  BP 188/76 mmHg  Pulse 62  Temp(Src) 97.3 F (36.3 C) (Oral)  Resp 14  SpO2 100%   Miamor Ayler Camprubi-Soms, PA-C 05/26/14 1904  Bethann BerkshireJoseph Zammit, MD 05/27/14 1318

## 2014-05-26 NOTE — ED Notes (Addendum)
Per EMS: pt coming from home with c/o fall. Pt was running across the street with granddaughter when she had a sudden leg cramp. Pt made it across the street and then fell onto the ground. Pt denies dizziness, weakness, n/v, neck or back pain. Denies hitting head, LOC, no blood thinners. Pt A&Ox4, respirations equal and unlabored, skin warm and dry

## 2014-06-03 ENCOUNTER — Encounter (HOSPITAL_COMMUNITY): Payer: Self-pay | Admitting: Emergency Medicine

## 2014-06-03 ENCOUNTER — Emergency Department (HOSPITAL_COMMUNITY)
Admission: EM | Admit: 2014-06-03 | Discharge: 2014-06-04 | Disposition: A | Discharge status: Home / Self Care | Payer: Medicare Other | Attending: Emergency Medicine | Admitting: Emergency Medicine

## 2014-06-03 DIAGNOSIS — M25572 Pain in left ankle and joints of left foot: Secondary | ICD-10-CM

## 2014-06-03 DIAGNOSIS — Z8673 Personal history of transient ischemic attack (TIA), and cerebral infarction without residual deficits: Secondary | ICD-10-CM | POA: Diagnosis not present

## 2014-06-03 DIAGNOSIS — R0789 Other chest pain: Secondary | ICD-10-CM | POA: Insufficient documentation

## 2014-06-03 DIAGNOSIS — IMO0001 Reserved for inherently not codable concepts without codable children: Secondary | ICD-10-CM

## 2014-06-03 DIAGNOSIS — E119 Type 2 diabetes mellitus without complications: Secondary | ICD-10-CM | POA: Insufficient documentation

## 2014-06-03 DIAGNOSIS — Z7902 Long term (current) use of antithrombotics/antiplatelets: Secondary | ICD-10-CM | POA: Insufficient documentation

## 2014-06-03 DIAGNOSIS — R51 Headache: Secondary | ICD-10-CM | POA: Diagnosis not present

## 2014-06-03 DIAGNOSIS — R519 Headache, unspecified: Secondary | ICD-10-CM

## 2014-06-03 DIAGNOSIS — I10 Essential (primary) hypertension: Secondary | ICD-10-CM | POA: Diagnosis not present

## 2014-06-03 DIAGNOSIS — Z8719 Personal history of other diseases of the digestive system: Secondary | ICD-10-CM | POA: Insufficient documentation

## 2014-06-03 DIAGNOSIS — Z79899 Other long term (current) drug therapy: Secondary | ICD-10-CM | POA: Diagnosis not present

## 2014-06-03 DIAGNOSIS — R030 Elevated blood-pressure reading, without diagnosis of hypertension: Secondary | ICD-10-CM

## 2014-06-03 NOTE — ED Provider Notes (Signed)
CSN: [id number]     Arrival date & time 06/03/14  2300 History   This chart was scribed for Adriana Raceravid Jasmane Brockway, MD by Abel PrestoKara Demonbreun, ED Scribe. This patient was seen in room A12C/A12C and the patient's care was started at 11:31 PM.    Chief Complaint  Patient presents with  . Hypertension  . Headache     The history is provided by the patient. No language interpreter was used.   HPI Comments: [name] is a 69 y.o. female brought in by ambulance, who presents to the Emergency Department complaining of elevated blood pressure with onset this evening. Pt states she called EMS after getting a reading of SBP of 169. She states reading had elevated to 202 upon EMS arrival. Pt is on HTN medication but reports she has been compliant. She states "I just don't feel good." Pt notes associated constant headache, recurrent chest pain with onset 10/2013, and left ankle pain with onset 05/26/14. Pt was seen in ED following a fall that caused the left ankle pain. She reports no XR was done at that time. Pt is able to ambulate with a limp. Pt was instructed to take Tylenol for pain, but states she has not been taking it.  Pt denies visual disturbances, focal weakness, numbness, SOB, and fever.   Patient had cardiac cath 10/15 with normal coronary arteries and EF. Past Medical History  Diagnosis Date  . Diabetes mellitus without complication   . Hypertension   . Stroke   . GERD 09/04/2006    Qualifier: Diagnosis of  By: Duke SalviaGore, Denise    . History of echocardiogram     a. 2D ECHO: 11/06/2013 EF 65%; no WMA. Mild TR. PA pk pressure 43 mm HG   Past Surgical History  Procedure Laterality Date  . Abdominal hysterectomy    . Left heart catheterization with coronary angiogram N/A 11/05/2013    Procedure: LEFT HEART CATHETERIZATION WITH CORONARY ANGIOGRAM;  Surgeon: Lennette Biharihomas A Kelly, MD;  [facility];  Service: Cardiovascular;  Laterality: N/A;   No family history on file. History  Substance Use  Topics  . Smoking status: Never Smoker   . Smokeless tobacco: Not on file  . Alcohol Use: No   OB History    No data available     Review of Systems  Constitutional: Negative for fever and chills.  Eyes: Negative for visual disturbance.  Respiratory: Negative for cough and shortness of breath.   Cardiovascular: Positive for chest pain. Negative for palpitations and leg swelling.  Gastrointestinal: Negative for nausea, vomiting, abdominal pain, diarrhea and constipation.  Musculoskeletal: Positive for arthralgias. Negative for back pain, neck pain and neck stiffness.  Skin: Negative for rash and wound.  Neurological: Positive for headaches. Negative for dizziness, syncope, weakness, light-headedness and numbness.  All other systems reviewed and are negative.     Allergies  Codeine and Ibuprofen  Home Medications   Prior to Admission medications   Medication Sig Start Date End Date Taking? Authorizing Provider  ALPRAZolam (XANAX) 0.25 MG tablet Take 1 tablet (0.25 mg total) by mouth at bedtime as needed for sleep. 05/12/13   Quita SkyeMichael Ghim, MD  amLODipine (NORVASC) 5 MG tablet Take 1 tablet (5 mg total) by mouth daily. 11/06/13   Janetta HoraKathryn R Thompson, PA-C  atenolol-chlorthalidone (TENORETIC) 100-25 MG per tablet Take 1 tablet by mouth daily.    Historical Provider, MD  atorvastatin (LIPITOR) 10 MG tablet Take 1 tablet (10 mg total) by mouth daily at 6  PM. 06/27/12   Mcarthur Rossetti Angiulli, PA-C  cloNIDine (CATAPRES) 0.2 MG tablet Take 0.2 mg by mouth 2 (two) times daily.    Historical Provider, MD  clopidogrel (PLAVIX) 75 MG tablet Take 75 mg by mouth daily.    Historical Provider, MD  diphenhydramine-acetaminophen (TYLENOL PM) 25-500 MG TABS Take 1 tablet by mouth at bedtime as needed (FOR SLEEP).    Historical Provider, MD  ezetimibe (ZETIA) 10 MG tablet Take 10 mg by mouth daily.    Historical Provider, MD  famotidine (PEPCID) 40 MG tablet Take 40 mg by mouth daily.    Historical  Provider, MD  glipiZIDE-metformin (METAGLIP) 5-500 MG per tablet Take 1 tablet by mouth at bedtime. 11/07/13   Janetta Hora, PA-C  hydrALAZINE (APRESOLINE) 100 MG tablet Take 50 mg by mouth 2 (two) times daily.     Historical Provider, MD  losartan (COZAAR) 100 MG tablet Take 1 tablet (100 mg total) by mouth daily. Patient not taking: Reported on 12/19/2013 06/27/12   Mcarthur Rossetti Angiulli, PA-C  nitroGLYCERIN (NITROSTAT) 0.4 MG SL tablet Place 1 tablet (0.4 mg total) under the tongue every 5 (five) minutes x 3 doses as needed for chest pain. 11/06/13   Janetta Hora, PA-C  traMADol (ULTRAM) 50 MG tablet Take 1 tablet (50 mg total) by mouth every 12 (twelve) hours as needed for moderate pain. 11/06/13   Janetta Hora, PA-C  traMADol (ULTRAM) 50 MG tablet Take 1 tablet (50 mg total) by mouth every 6 (six) hours as needed. 12/19/13   Courtney Forcucci, PA-C   BP 164/63 mmHg  Pulse 57  Temp(Src) 98.2 F (36.8 C) (Oral)  Resp 21  Wt 150 lb (68.04 kg)  SpO2 99% Physical Exam  Constitutional: She is oriented to person, place, and time. She appears well-developed and well-nourished. No distress.  HENT:  Head: Normocephalic and atraumatic.  Mouth/Throat: Oropharynx is clear and moist.  Eyes: EOM are normal. Pupils are equal, round, and reactive to light.  Neck: Normal range of motion. Neck supple.  Cardiovascular: Normal rate and regular rhythm.   Pulmonary/Chest: Effort normal and breath sounds normal. No respiratory distress. She has no wheezes. She has no rales. She exhibits no tenderness.  Abdominal: Soft. Bowel sounds are normal. She exhibits no distension and no mass. There is no tenderness. There is no rebound and no guarding.  Musculoskeletal: Normal range of motion. She exhibits tenderness. She exhibits no edema.  Patient with diffuse bimalleolar tenderness of the left ankle. There is no obvious deformity. Distal pulses intact. No proximal fibular tenderness  Neurological: She  is alert and oriented to person, place, and time.  5/5 motor in all extremities. Sensation is fully intact.  Skin: Skin is warm and dry. No rash noted. No erythema.  Psychiatric: She has a normal mood and affect. Her behavior is normal.  Nursing note and vitals reviewed.   ED Course  Procedures (including critical care time) DIAGNOSTIC STUDIES: Oxygen Saturation is 100% on room air, normal by my interpretation.    COORDINATION OF CARE: 11:38 PM Discussed treatment plan with patient at beside, the patient agrees with the plan and has no further questions at this time.   Labs Review Labs Reviewed  I-STAT CHEM 8, ED - Abnormal; Notable for the following:    Potassium 2.8 (*)    Chloride 97 (*)    Creatinine, Ser 1.10 (*)    Glucose, Bld 196 (*)    Calcium, Ion 1.07 (*)  All other components within normal limits  I-STAT TROPOININ, ED  I-STAT TROPOININ, ED    Imaging Review Dg Ankle Complete Left  06/04/2014   CLINICAL DATA:  Patient fell 1 week ago after legs gave out. Left ankle weakness with swelling and tenderness.  EXAM: LEFT ANKLE COMPLETE - 3+ VIEW  COMPARISON:  None.  FINDINGS: There is no evidence of fracture, dislocation, or joint effusion. There is no evidence of arthropathy or other focal bone abnormality. Soft tissues are unremarkable.  IMPRESSION: Negative.   Electronically Signed   By: Burman NievesWilliam  Stevens M.D.   On: 06/04/2014 00:12     EKG Interpretation   Date/Time:  Thursday Jun 03 2014 23:48:50 EDT Ventricular Rate:  60 PR Interval:  136 QRS Duration: 78 QT Interval:  474 QTC Calculation: 474 R Axis:   74 Text Interpretation:  Sinus rhythm Confirmed by Klyde Banka  MD, Denym Christenberry  ([id number]) on 06/04/2014 12:45:39 AM      MDM   Final diagnoses:  Elevated blood pressure  Left ankle pain  Atypical chest pain  Nonintractable headache, unspecified chronicity pattern, unspecified headache type   Patient's chest pain is very atypical. She has a normal EKG and  troponin 2. Recent cardiac catheterization without evidence of any coronary artery disease.  Patient with mild frontal headache. She has a normal neurologic exam. Improved with Tylenol. Don't believe imaging is necessary at this point.  Potassium replaced orally in the emergency department.  Left ankle x-ray without evidence of fracture. Ace bandage placed. Next   I personally performed the services described in this documentation, which was scribed in my presence. The recorded information has been reviewed and is accurate.     Adriana Raceravid Azariya Freeman, MD 06/04/14 [phone]

## 2014-06-03 NOTE — ED Notes (Signed)
Per EMS pt had chest pain p/t arrival, 1 nitro relieved this pain. Not sure if same as current breast pain.

## 2014-06-03 NOTE — ED Notes (Signed)
Pt arrives via EMS with HTN 202/96 per fire and headache ongoing for 12 hours. Last pressure 175/77 per EMS. States her head hurts when her pressure is high. States she just took regular BP meds, no pain meds. L breast pain and L arm pain at this time, pt also c/o chronic L ankle pain - tender to the touch.

## 2014-06-04 ENCOUNTER — Emergency Department (HOSPITAL_COMMUNITY): Payer: Medicare Other

## 2014-06-04 DIAGNOSIS — R51 Headache: Secondary | ICD-10-CM | POA: Diagnosis not present

## 2014-06-04 LAB — I-STAT TROPONIN, ED
Troponin i, poc: 0.01 ng/mL (ref 0.00–0.08)
Troponin i, poc: 0.03 ng/mL (ref 0.00–0.08)

## 2014-06-04 LAB — I-STAT CHEM 8, ED
BUN: 9 mg/dL (ref 6–20)
Calcium, Ion: 1.07 mmol/L — ABNORMAL LOW (ref 1.13–1.30)
Chloride: 97 mmol/L — ABNORMAL LOW (ref 101–111)
Creatinine, Ser: 1.1 mg/dL — ABNORMAL HIGH (ref 0.44–1.00)
Glucose, Bld: 196 mg/dL — ABNORMAL HIGH (ref 65–99)
HCT: 43 % (ref 36.0–46.0)
Hemoglobin: 14.6 g/dL (ref 12.0–15.0)
Potassium: 2.8 mmol/L — ABNORMAL LOW (ref 3.5–5.1)
Sodium: 139 mmol/L (ref 135–145)
TCO2: 23 mmol/L (ref 0–100)

## 2014-06-04 MED ORDER — POTASSIUM CHLORIDE CRYS ER 20 MEQ PO TBCR
40.0000 meq | EXTENDED_RELEASE_TABLET | Freq: Once | ORAL | Status: AC
  Administered 2014-06-04: 40 meq via ORAL
  Filled 2014-06-04: qty 2

## 2014-06-04 MED ORDER — ACETAMINOPHEN 325 MG PO TABS
650.0000 mg | ORAL_TABLET | ORAL | Status: DC | PRN
  Administered 2014-06-04: 650 mg via ORAL
  Filled 2014-06-04: qty 2

## 2014-06-04 NOTE — Discharge Instructions (Signed)
Ankle Pain Ankle pain is a common symptom. The bones, cartilage, tendons, and muscles of the ankle joint perform a lot of work each day. The ankle joint holds your body weight and allows you to move around. Ankle pain can occur on either side or back of 1 or both ankles. Ankle pain may be sharp and burning or dull and aching. There may be tenderness, stiffness, redness, or warmth around the ankle. The pain occurs more often when a person walks or puts pressure on the ankle. CAUSES  There are many reasons ankle pain can develop. It is important to work with your caregiver to identify the cause since many conditions can impact the bones, cartilage, muscles, and tendons. Causes for ankle pain include:  Injury, including a break (fracture), sprain, or strain often due to a fall, sports, or a high-impact activity.  Swelling (inflammation) of a tendon (tendonitis).  Achilles tendon rupture.  Ankle instability after repeated sprains and strains.  Poor foot alignment.  Pressure on a nerve (tarsal tunnel syndrome).  Arthritis in the ankle or the lining of the ankle.  Crystal formation in the ankle (gout or pseudogout). DIAGNOSIS  A diagnosis is based on your medical history, your symptoms, results of your physical exam, and results of diagnostic tests. Diagnostic tests may include X-ray exams or a computerized magnetic scan (magnetic resonance imaging, MRI). TREATMENT  Treatment will depend on the cause of your ankle pain and may include:  Keeping pressure off the ankle and limiting activities.  Using crutches or other walking support (a cane or brace).  Using rest, ice, compression, and elevation.  Participating in physical therapy or home exercises.  Wearing shoe inserts or special shoes.  Losing weight.  Taking medications to reduce pain or swelling or receiving an injection.  Undergoing surgery. HOME CARE INSTRUCTIONS   Only take over-the-counter or prescription medicines for  pain, discomfort, or fever as directed by your caregiver.  Put ice on the injured area.  Put ice in a plastic bag.  Place a towel between your skin and the bag.  Leave the ice on for 15-20 minutes at a time, 03-04 times a day.  Keep your leg raised (elevated) when possible to lessen swelling.  Avoid activities that cause ankle pain.  Follow specific exercises as directed by your caregiver.  Record how often you have ankle pain, the location of the pain, and what it feels like. This information may be helpful to you and your caregiver.  Ask your caregiver about returning to work or sports and whether you should drive.  Follow up with your caregiver for further examination, therapy, or testing as directed. SEEK MEDICAL CARE IF:   Pain or swelling continues or worsens beyond 1 week.  You have an oral temperature above 102 F (38.9 C).  You are feeling unwell or have chills.  You are having an increasingly difficult time with walking.  You have loss of sensation or other new symptoms.  You have questions or concerns. MAKE SURE YOU:   Understand these instructions.  Will watch your condition.  Will get help right away if you are not doing well or get worse. Document Released: 06/28/2009 Document Revised: 04/02/2011 Document Reviewed: 06/28/2009 [facility]Exit[facility] Patient Information 2015 [place]ExitCare, [place]LLC. This information is not intended to replace advice given to you by your health care provider. Make sure you discuss any questions you have with your health care provider.  Chest Pain (Nonspecific) It is often hard to give a diagnosis for the  cause of chest pain. There is always a chance that your pain could be related to something serious, such as a heart attack or a blood clot in the lungs. You need to follow up with your doctor. HOME CARE  If antibiotic medicine was given, take it as directed by your doctor. Finish the medicine even if you start to feel better.  For the next few  days, avoid activities that bring on chest pain. Continue physical activities as told by your doctor.  Do not use any tobacco products. This includes cigarettes, chewing tobacco, and e-cigarettes.  Avoid drinking alcohol.  Only take medicine as told by your doctor.  Follow your doctor's suggestions for more testing if your chest pain does not go away.  Keep all doctor visits you made. GET HELP IF:  Your chest pain does not go away, even after treatment.  You have a rash with blisters on your chest.  You have a fever. GET HELP RIGHT AWAY IF:   You have more pain or pain that spreads to your arm, neck, jaw, back, or belly (abdomen).  You have shortness of breath.  You cough more than usual or cough up blood.  You have very bad back or belly pain.  You feel sick to your stomach (nauseous) or throw up (vomit).  You have very bad weakness.  You pass out (faint).  You have chills. This is an emergency. Do not wait to see if the problems will go away. Call your local emergency services (911 in [place] Do not drive yourself to the hospital. MAKE SURE YOU:   Understand these instructions.  Will watch your condition.  Will get help right away if you are not doing well or get worse. Document Released: 06/27/2007 Document Revised: 01/13/2013 Document Reviewed: 06/27/2007 [facility] IncExitCare Patient Information 2015 [place]ExitCare, [place]LLC. This information is not intended to replace advice given to you by your health care provider. Make sure you discuss any questions you have with your health care provider.  Hypertension Hypertension is another name for high blood pressure. High blood pressure forces your heart to work harder to pump blood. A blood pressure reading has two numbers, which includes a higher number over a lower number (example: 110/72). HOME CARE   Have your blood pressure rechecked by your doctor.  Only take medicine as told by your doctor. Follow the directions carefully. The  medicine does not work as well if you skip doses. Skipping doses also puts you at risk for problems.  Do not smoke.  Monitor your blood pressure at home as told by your doctor. GET HELP IF:  You think you are having a reaction to the medicine you are taking.  You have repeat headaches or feel dizzy.  You have puffiness (swelling) in your ankles.  You have trouble with your vision. GET HELP RIGHT AWAY IF:   You get a very bad headache and are confused.  You feel weak, numb, or faint.  You get chest or belly (abdominal) pain.  You throw up (vomit).  You cannot breathe very well. MAKE SURE YOU:   Understand these instructions.  Will watch your condition.  Will get help right away if you are not doing well or get worse. Document Released: 06/27/2007 Document Revised: 01/13/2013 Document Reviewed: 10/31/2012 [facility] AlexandriaExitCare Patient Information 2015 [place]ExitCare, MarylandLLC. This information is not intended to replace advice given to you by your health care provider. Make sure you discuss any questions you have with your health care provider.

## 2014-08-23 ENCOUNTER — Other Ambulatory Visit (HOSPITAL_COMMUNITY): Payer: Self-pay | Admitting: Nurse Practitioner

## 2014-08-23 DIAGNOSIS — Z1231 Encounter for screening mammogram for malignant neoplasm of breast: Secondary | ICD-10-CM

## 2014-09-02 ENCOUNTER — Ambulatory Visit (HOSPITAL_COMMUNITY)
Admission: RE | Admit: 2014-09-02 | Discharge: 2014-09-02 | Disposition: A | Discharge status: Home / Self Care | Payer: Medicare Other | Source: Ambulatory Visit | Attending: Nurse Practitioner | Admitting: Nurse Practitioner

## 2014-09-02 DIAGNOSIS — Z1231 Encounter for screening mammogram for malignant neoplasm of breast: Secondary | ICD-10-CM | POA: Diagnosis not present

## 2014-09-11 ENCOUNTER — Encounter (HOSPITAL_COMMUNITY): Payer: Self-pay | Admitting: Emergency Medicine

## 2014-09-11 ENCOUNTER — Emergency Department (HOSPITAL_COMMUNITY)
Admission: EM | Admit: 2014-09-11 | Discharge: 2014-09-11 | Disposition: A | Discharge status: Home / Self Care | Payer: Medicare Other | Attending: Emergency Medicine | Admitting: Emergency Medicine

## 2014-09-11 ENCOUNTER — Emergency Department (HOSPITAL_COMMUNITY): Payer: Medicare Other

## 2014-09-11 DIAGNOSIS — Z8673 Personal history of transient ischemic attack (TIA), and cerebral infarction without residual deficits: Secondary | ICD-10-CM | POA: Diagnosis not present

## 2014-09-11 DIAGNOSIS — Z9889 Other specified postprocedural states: Secondary | ICD-10-CM | POA: Diagnosis not present

## 2014-09-11 DIAGNOSIS — E119 Type 2 diabetes mellitus without complications: Secondary | ICD-10-CM | POA: Insufficient documentation

## 2014-09-11 DIAGNOSIS — E876 Hypokalemia: Secondary | ICD-10-CM | POA: Insufficient documentation

## 2014-09-11 DIAGNOSIS — Z79899 Other long term (current) drug therapy: Secondary | ICD-10-CM | POA: Insufficient documentation

## 2014-09-11 DIAGNOSIS — Z8719 Personal history of other diseases of the digestive system: Secondary | ICD-10-CM | POA: Insufficient documentation

## 2014-09-11 DIAGNOSIS — R0789 Other chest pain: Secondary | ICD-10-CM | POA: Insufficient documentation

## 2014-09-11 DIAGNOSIS — R079 Chest pain, unspecified: Secondary | ICD-10-CM | POA: Diagnosis present

## 2014-09-11 DIAGNOSIS — R531 Weakness: Secondary | ICD-10-CM | POA: Insufficient documentation

## 2014-09-11 DIAGNOSIS — I10 Essential (primary) hypertension: Secondary | ICD-10-CM | POA: Diagnosis not present

## 2014-09-11 LAB — CBC WITH DIFFERENTIAL/PLATELET
Basophils Absolute: 0 10*3/uL (ref 0.0–0.1)
Basophils Relative: 0 % (ref 0–1)
Eosinophils Absolute: 0.1 10*3/uL (ref 0.0–0.7)
Eosinophils Relative: 1 % (ref 0–5)
HCT: 35.3 % — ABNORMAL LOW (ref 36.0–46.0)
Hemoglobin: 11.7 g/dL — ABNORMAL LOW (ref 12.0–15.0)
Lymphocytes Relative: 16 % (ref 12–46)
Lymphs Abs: 1.2 10*3/uL (ref 0.7–4.0)
MCH: 30.7 pg (ref 26.0–34.0)
MCHC: 33.1 g/dL (ref 30.0–36.0)
MCV: 92.7 fL (ref 78.0–100.0)
Monocytes Absolute: 0.4 10*3/uL (ref 0.1–1.0)
Monocytes Relative: 5 % (ref 3–12)
Neutro Abs: 5.7 10*3/uL (ref 1.7–7.7)
Neutrophils Relative %: 78 % — ABNORMAL HIGH (ref 43–77)
Platelets: 227 10*3/uL (ref 150–400)
RBC: 3.81 MIL/uL — ABNORMAL LOW (ref 3.87–5.11)
RDW: 13 % (ref 11.5–15.5)
WBC: 7.3 10*3/uL (ref 4.0–10.5)

## 2014-09-11 LAB — COMPREHENSIVE METABOLIC PANEL
ALT: 20 U/L (ref 14–54)
AST: 28 U/L (ref 15–41)
Albumin: 3.5 g/dL (ref 3.5–5.0)
Alkaline Phosphatase: 98 U/L (ref 38–126)
Anion gap: 12 (ref 5–15)
BUN: 7 mg/dL (ref 6–20)
CO2: 28 mmol/L (ref 22–32)
Calcium: 8.5 mg/dL — ABNORMAL LOW (ref 8.9–10.3)
Chloride: 99 mmol/L — ABNORMAL LOW (ref 101–111)
Creatinine, Ser: 1.05 mg/dL — ABNORMAL HIGH (ref 0.44–1.00)
GFR calc Af Amer: >60 mL/min (ref >60)
GFR calc non Af Amer: 54 mL/min — ABNORMAL LOW (ref >60)
Glucose, Bld: 138 mg/dL — ABNORMAL HIGH (ref 65–99)
Potassium: 2.7 mmol/L — CL (ref 3.5–5.1)
Sodium: 139 mmol/L (ref 135–145)
Total Bilirubin: 0.7 mg/dL (ref 0.3–1.2)
Total Protein: 7.7 g/dL (ref 6.5–8.1)

## 2014-09-11 LAB — TROPONIN I
Troponin I: <0.03 ng/mL (ref <0.031)
Troponin I: <0.03 ng/mL (ref <0.031)

## 2014-09-11 LAB — D-DIMER, QUANTITATIVE (NOT AT ARMC): D-Dimer, Quant: 0.72 ug/mL-FEU — ABNORMAL HIGH (ref 0.00–0.48)

## 2014-09-11 LAB — BRAIN NATRIURETIC PEPTIDE: B Natriuretic Peptide: 234.1 pg/mL — ABNORMAL HIGH (ref 0.0–100.0)

## 2014-09-11 MED ORDER — IOHEXOL 350 MG/ML SOLN
80.0000 mL | Freq: Once | INTRAVENOUS | Status: AC | PRN
  Administered 2014-09-11: 100 mL via INTRAVENOUS

## 2014-09-11 MED ORDER — NAPROXEN 250 MG PO TABS
500.0000 mg | ORAL_TABLET | Freq: Once | ORAL | Status: AC
  Administered 2014-09-11: 500 mg via ORAL
  Filled 2014-09-11: qty 2

## 2014-09-11 MED ORDER — POTASSIUM CHLORIDE 10 MEQ/100ML IV SOLN
10.0000 meq | INTRAVENOUS | Status: AC
  Administered 2014-09-11 (×2): 10 meq via INTRAVENOUS
  Filled 2014-09-11 (×2): qty 100

## 2014-09-11 MED ORDER — POTASSIUM CHLORIDE CRYS ER 20 MEQ PO TBCR
40.0000 meq | EXTENDED_RELEASE_TABLET | Freq: Once | ORAL | Status: AC
  Administered 2014-09-11: 40 meq via ORAL
  Filled 2014-09-11: qty 2

## 2014-09-11 NOTE — ED Provider Notes (Signed)
CSN: [id number]     Arrival date & time 09/11/14  1222 History   First MD Initiated Contact with Patient 09/11/14 1224     Chief Complaint  Patient presents with  . Chest Pain     (Consider location/radiation/quality/duration/timing/severity/associated sxs/prior Treatment) HPI Comments: Patient presents with left-sided chest pain has been constant since last night. She took nitroglycerin without relief. Pain is in the left side of her chest and radiates to her left arm and neck. She did not sleep because of chest pain. She denies any shortness of breath or nausea. She denies any dizziness or lightheadedness. She denies any fever or cough. He states the pain is on the side of "had my stroke.". She denies any new weakness, numbness or tingling. She was given nitroglycerin by EMS and her chest pain is partially relieved. She denies any leg pain or leg swelling.  The history is provided by the patient and the EMS personnel.    Past Medical History  Diagnosis Date  . Diabetes mellitus without complication   . Hypertension   . Stroke   . GERD 09/04/2006    Qualifier: Diagnosis of  By: Duke Salvia    . History of echocardiogram     a. 2D ECHO: 11/06/2013 EF 65%; no WMA. Mild TR. PA pk pressure 43 mm HG   Past Surgical History  Procedure Laterality Date  . Abdominal hysterectomy    . Left heart catheterization with coronary angiogram N/A 11/05/2013    Procedure: LEFT HEART CATHETERIZATION WITH CORONARY ANGIOGRAM;  Surgeon: Lennette Bihari, MD;  Location: [facility] CATH LAB;  Service: Cardiovascular;  Laterality: N/A;   History reviewed. No pertinent family history. Social History  Substance Use Topics  . Smoking status: Never Smoker   . Smokeless tobacco: None  . Alcohol Use: No   OB History    No data available     Review of Systems  Constitutional: Negative for activity change and appetite change.  HENT: Negative for congestion and rhinorrhea.   Respiratory: Positive for chest  tightness. Negative for cough and shortness of breath.   Cardiovascular: Positive for chest pain. Negative for palpitations and leg swelling.  Gastrointestinal: Negative for nausea, vomiting and abdominal pain.  Genitourinary: Negative for dysuria, hematuria, vaginal bleeding and vaginal discharge.  Musculoskeletal: Negative for myalgias and arthralgias.  Skin: Negative for rash.  Neurological: Positive for weakness. Negative for dizziness, light-headedness and headaches.  A complete 10 system review of systems was obtained and all systems are negative except as noted in the HPI and PMH.      Allergies  Codeine and Ibuprofen  Home Medications   Prior to Admission medications   Medication Sig Start Date End Date Taking? Authorizing Provider  amLODipine (NORVASC) 5 MG tablet Take 1 tablet (5 mg total) by mouth daily. 11/06/13  Yes Janetta Hora, PA-C  atenolol-chlorthalidone (TENORETIC) 100-25 MG per tablet Take 1 tablet by mouth daily.   Yes Historical Provider, MD  atorvastatin (LIPITOR) 10 MG tablet Take 1 tablet (10 mg total) by mouth daily at 6 PM. 06/27/12  Yes Mcarthur Rossetti Angiulli, PA-C  cloNIDine (CATAPRES) 0.2 MG tablet Take 0.2 mg by mouth 2 (two) times daily.   Yes Historical Provider, MD  ezetimibe (ZETIA) 10 MG tablet Take 10 mg by mouth daily.   Yes Historical Provider, MD  glipiZIDE-metformin (METAGLIP) 5-500 MG per tablet Take 1 tablet by mouth at bedtime. 11/07/13  Yes Janetta Hora, PA-C  hydrALAZINE (APRESOLINE) 100 MG tablet  Take 50 mg by mouth 2 (two) times daily.    Yes Historical Provider, MD  nitroGLYCERIN (NITROSTAT) 0.4 MG SL tablet Place 1 tablet (0.4 mg total) under the tongue every 5 (five) minutes x 3 doses as needed for chest pain. 11/06/13  Yes Janetta Hora, PA-C  ALPRAZolam Prudy Feeler) 0.25 MG tablet Take 1 tablet (0.25 mg total) by mouth at bedtime as needed for sleep. 05/12/13   Quita Skye, MD  clopidogrel (PLAVIX) 75 MG tablet Take 75 mg by mouth  daily.    Historical Provider, MD  diphenhydramine-acetaminophen (TYLENOL PM) 25-500 MG TABS Take 1 tablet by mouth at bedtime as needed (FOR SLEEP).    Historical Provider, MD  famotidine (PEPCID) 40 MG tablet Take 40 mg by mouth daily.    Historical Provider, MD  losartan (COZAAR) 100 MG tablet Take 1 tablet (100 mg total) by mouth daily. Patient not taking: Reported on 12/19/2013 06/27/12   Mcarthur Rossetti Angiulli, PA-C  traMADol (ULTRAM) 50 MG tablet Take 1 tablet (50 mg total) by mouth every 12 (twelve) hours as needed for moderate pain. 11/06/13   Janetta Hora, PA-C  traMADol (ULTRAM) 50 MG tablet Take 1 tablet (50 mg total) by mouth every 6 (six) hours as needed. 12/19/13   Courtney Forcucci, PA-C   BP 143/65 mmHg  Pulse 57  Temp(Src) 98.1 F (36.7 C) (Oral)  Resp 22  Ht  (1.676 m)  Wt 152 lb (68.947 kg)  BMI 24.55 kg/m2  SpO2 98% Physical Exam  Constitutional: She is oriented to person, place, and time. She appears well-developed and well-nourished. No distress.  HENT:  Head: Normocephalic and atraumatic.  Mouth/Throat: Oropharynx is clear and moist. No oropharyngeal exudate.  Eyes: Conjunctivae and EOM are normal. Pupils are equal, round, and reactive to light.  Neck: Normal range of motion. Neck supple.  No meningismus.  Cardiovascular: Normal rate, regular rhythm, normal heart sounds and intact distal pulses.   No murmur heard. Pulmonary/Chest: Effort normal and breath sounds normal. No respiratory distress. She exhibits tenderness.  L Chest wall tenderness  Abdominal: Soft. There is no tenderness. There is no rebound and no guarding.  Musculoskeletal: Normal range of motion. She exhibits no edema or tenderness.  Neurological: She is alert and oriented to person, place, and time. No cranial nerve deficit. She exhibits normal muscle tone. Coordination normal.  No ataxia on finger to nose bilaterally. No pronator drift. 5/5 strength throughout. CN 2-12 intact. Equal grip  strength. Sensation intact.  Subjective numbness on left side that is unchanged. No weakness.  Skin: Skin is warm.  Psychiatric: She has a normal mood and affect. Her behavior is normal.  Nursing note and vitals reviewed.   ED Course  Procedures (including critical care time) Labs Review Labs Reviewed  CBC WITH DIFFERENTIAL/PLATELET - Abnormal; Notable for the following:    RBC 3.81 (*)    Hemoglobin 11.7 (*)    HCT 35.3 (*)    Neutrophils Relative % 78 (*)    All other components within normal limits  COMPREHENSIVE METABOLIC PANEL - Abnormal; Notable for the following:    Potassium 2.7 (*)    Chloride 99 (*)    Glucose, Bld 138 (*)    Creatinine, Ser 1.05 (*)    Calcium 8.5 (*)    GFR calc non Af Amer 54 (*)    All other components within normal limits  BRAIN NATRIURETIC PEPTIDE - Abnormal; Notable for the following:    B Natriuretic Peptide  234.1 (*)    All other components within normal limits  D-DIMER, QUANTITATIVE (NOT AT [facility]) - Abnormal; Notable for the following:    D-Dimer, Quant 0.72 (*)    All other components within normal limits  TROPONIN I  TROPONIN I    Imaging Review Dg Chest 2 View  09/11/2014   CLINICAL DATA:  Left arm weakness  EXAM: CHEST  2 VIEW  COMPARISON:  12/19/2013  FINDINGS: Borderline cardiomegaly. Clear lungs. Normal vascularity. No pleural effusion or pneumothorax.  IMPRESSION: Borderline cardiomegaly without decompensation.   Electronically Signed   By: Jolaine Click M.D.   On: 09/11/2014 13:45   Ct Angio Chest Pe W/cm &/or Wo Cm  09/11/2014   CLINICAL DATA:  Patient with left-sided chest pain beginning last night. Shortness of breath.  EXAM: CT ANGIOGRAPHY CHEST WITH CONTRAST  TECHNIQUE: Multidetector CT imaging of the chest was performed using the standard protocol during bolus administration of intravenous contrast. Multiplanar CT image reconstructions and MIPs were obtained to evaluate the vascular anatomy.  CONTRAST:  OMNIPAQUE IOHEXOL  350 MG/ML SOLN  COMPARISON:  Chest radiograph 09/11/2014; chest CT 11/04/2013  FINDINGS: Mediastinum/Nodes: No enlarged axillary, mediastinal or hilar lymphadenopathy. Normal heart size. No pericardial effusion. Aorta and main pulmonary artery are normal in caliber. Motion artifact limits evaluation within the lower lobes bilaterally. No evidence for pulmonary embolism.  Lungs/Pleura: Dependent atelectasis within the bilateral lower lobes. No large areas of pulmonary consolidation. No pleural effusion or pneumothorax.  Upper abdomen: Stable sub cm low-attenuation lesion hepatic dome, too small to characterize. Calcified granulomata within the spleen. Small amount of high density material within the distal esophagus and stomach. Thickening of the left adrenal gland, stable.  Musculoskeletal: No aggressive or acute appearing osseous lesions.  Review of the MIP images confirms the above findings.  IMPRESSION: No evidence for pulmonary embolism.  Dependent atelectasis.   Electronically Signed   By: Annia Belt M.D.   On: 09/11/2014 15:18   I have personally reviewed and evaluated these images and lab results as part of my medical decision-making.   EKG Interpretation   Date/Time:  Saturday September 11 2014 12:22:44 EDT Ventricular Rate:  63 PR Interval:  131 QRS Duration: 76 QT Interval:  427 QTC Calculation: 437 R Axis:   70 Text Interpretation:  Sinus rhythm Supraventricular bigeminy Minimal ST  depression, inferior leads Nonspecific ST abnormality Confirmed by Manus Gunning   MD, Pius Byrom [phone]) on 09/11/2014 12:42:20 PM      MDM   Final diagnoses:  Atypical chest pain  Hypokalemia   constant left-sided chest pain since last night radiating to neck and left arm. EKG with nonspecific ST changes in the inferior leads.  Patient's pain is reproducible. She had a catheterization in October 2015 that showed normal coronary arteries.  EKG is unchanged. Troponin is negative. D-dimer was positive but CT  angiogram is negative for PE. Low potassium noted in repleted. Patient is on chronic diaphoretic.  Her pain seems atypical for ACS and has been constant since last night. It is somewhat reproducible to palpation. She had a clean catheterization in October 2015. Second troponin pending at time of signout. Expect discharge home if negative.  Glynn Octave, MD 09/11/14 [phone]

## 2014-09-11 NOTE — ED Notes (Signed)
Left sided chest pain that began last night.  Took one nitro without relief;  Did not sleep well due to chest pain.  Now reports pain improved s/p one ntg sl given by ambulance.  Pain does not radiating; denies nausea.  Pain now 5/10 down from 10/10

## 2014-09-11 NOTE — Discharge Instructions (Signed)
Chest Pain (Nonspecific) °There is no evidence of heart attack or blood clot in the lung. Follow up with your doctor. Return to the ED if you develop new or worsening symptoms. °It is often hard to give a specific diagnosis for the cause of chest pain. There is always a chance that your pain could be related to something serious, such as a heart attack or a blood clot in the lungs. You need to follow up with your health care provider for further evaluation. °CAUSES  °· Heartburn. °· Pneumonia or bronchitis. °· Anxiety or stress. °· Inflammation around your heart (pericarditis) or lung (pleuritis or pleurisy). °· A blood clot in the lung. °· A collapsed lung (pneumothorax). It can develop suddenly on its own (spontaneous pneumothorax) or from trauma to the chest. °· Shingles infection (herpes zoster virus). °The chest wall is composed of bones, muscles, and cartilage. Any of these can be the source of the pain. °· The bones can be bruised by injury. °· The muscles or cartilage can be strained by coughing or overwork. °· The cartilage can be affected by inflammation and become sore (costochondritis). °DIAGNOSIS  °Lab tests or other studies may be needed to find the cause of your pain. Your health care provider may have you take a test called an ambulatory electrocardiogram (ECG). An ECG records your heartbeat patterns over a 24-hour period. You may also have other tests, such as: °· Transthoracic echocardiogram (TTE). During echocardiography, sound waves are used to evaluate how blood flows through your heart. °· Transesophageal echocardiogram (TEE). °· Cardiac monitoring. This allows your health care provider to monitor your heart rate and rhythm in real time. °· Holter monitor. This is a portable device that records your heartbeat and can help diagnose heart arrhythmias. It allows your health care provider to track your heart activity for several days, if needed. °· Stress tests by exercise or by giving medicine  that makes the heart beat faster. °TREATMENT  °· Treatment depends on what may be causing your chest pain. Treatment may include: °¨ Acid blockers for heartburn. °¨ Anti-inflammatory medicine. °¨ Pain medicine for inflammatory conditions. °¨ Antibiotics if an infection is present. °· You may be advised to change lifestyle habits. This includes stopping smoking and avoiding alcohol, caffeine, and chocolate. °· You may be advised to keep your head raised (elevated) when sleeping. This reduces the chance of acid going backward from your stomach into your esophagus. °Most of the time, nonspecific chest pain will improve within 2-3 days with rest and mild pain medicine.  °HOME CARE INSTRUCTIONS  °· If antibiotics were prescribed, take them as directed. Finish them even if you start to feel better. °· For the next few days, avoid physical activities that bring on chest pain. Continue physical activities as directed. °· Do not use any tobacco products, including cigarettes, chewing tobacco, or electronic cigarettes. °· Avoid drinking alcohol. °· Only take medicine as directed by your health care provider. °· Follow your health care provider's suggestions for further testing if your chest pain does not go away. °· Keep any follow-up appointments you made. If you do not go to an appointment, you could develop lasting (chronic) problems with pain. If there is any problem keeping an appointment, call to reschedule. °SEEK MEDICAL CARE IF:  °· Your chest pain does not go away, even after treatment. °· You have a rash with blisters on your chest. °· You have a fever. °SEEK IMMEDIATE MEDICAL CARE IF:  °· You have increased   chest pain or pain that spreads to your arm, neck, jaw, back, or abdomen. °· You have shortness of breath. °· You have an increasing cough, or you cough up blood. °· You have severe back or abdominal pain. °· You feel nauseous or vomit. °· You have severe weakness. °· You faint. °· You have chills. °This is an  emergency. Do not wait to see if the pain will go away. Get medical help at once. Call your local emergency services (911 in [place] Do not drive yourself to the hospital. °MAKE SURE YOU:  °· Understand these instructions. °· Will watch your condition. °· Will get help right away if you are not doing well or get worse. °Document Released: 10/18/2004 Document Revised: 01/13/2013 Document Reviewed: 08/14/2007 °ExitCare® Patient Information ©2015 [employer], [employer]. This information is not intended to replace advice given to you by your health care provider. Make sure you discuss any questions you have with your health care provider. ° °

## 2014-09-11 NOTE — ED Notes (Signed)
Critical lab K+ 2.7; dr Manus Gunning notified.

## 2014-09-11 NOTE — ED Notes (Signed)
Pt out of room for imaging 

## 2014-09-11 NOTE — ED Notes (Signed)
Patient transported to X-ray 

## 2014-11-19 ENCOUNTER — Encounter (HOSPITAL_COMMUNITY): Payer: Self-pay | Admitting: Cardiology

## 2014-11-19 ENCOUNTER — Emergency Department (HOSPITAL_COMMUNITY)
Admission: EM | Admit: 2014-11-19 | Discharge: 2014-11-19 | Disposition: A | Discharge status: Home / Self Care | Payer: Medicare Other | Attending: Emergency Medicine | Admitting: Emergency Medicine

## 2014-11-19 ENCOUNTER — Emergency Department (HOSPITAL_COMMUNITY): Payer: Medicare Other

## 2014-11-19 DIAGNOSIS — I10 Essential (primary) hypertension: Secondary | ICD-10-CM | POA: Diagnosis not present

## 2014-11-19 DIAGNOSIS — Z8673 Personal history of transient ischemic attack (TIA), and cerebral infarction without residual deficits: Secondary | ICD-10-CM | POA: Insufficient documentation

## 2014-11-19 DIAGNOSIS — R0789 Other chest pain: Secondary | ICD-10-CM

## 2014-11-19 DIAGNOSIS — E876 Hypokalemia: Secondary | ICD-10-CM | POA: Diagnosis not present

## 2014-11-19 DIAGNOSIS — Z79899 Other long term (current) drug therapy: Secondary | ICD-10-CM | POA: Diagnosis not present

## 2014-11-19 DIAGNOSIS — Z8719 Personal history of other diseases of the digestive system: Secondary | ICD-10-CM | POA: Insufficient documentation

## 2014-11-19 DIAGNOSIS — R001 Bradycardia, unspecified: Secondary | ICD-10-CM | POA: Insufficient documentation

## 2014-11-19 DIAGNOSIS — R079 Chest pain, unspecified: Secondary | ICD-10-CM | POA: Diagnosis present

## 2014-11-19 DIAGNOSIS — E119 Type 2 diabetes mellitus without complications: Secondary | ICD-10-CM | POA: Insufficient documentation

## 2014-11-19 LAB — BASIC METABOLIC PANEL
Anion gap: 10 (ref 5–15)
BUN: 7 mg/dL (ref 6–20)
CO2: 30 mmol/L (ref 22–32)
Calcium: 8.8 mg/dL — ABNORMAL LOW (ref 8.9–10.3)
Chloride: 96 mmol/L — ABNORMAL LOW (ref 101–111)
Creatinine, Ser: 1.03 mg/dL — ABNORMAL HIGH (ref 0.44–1.00)
GFR calc Af Amer: >60 mL/min (ref >60)
GFR calc non Af Amer: 55 mL/min — ABNORMAL LOW (ref >60)
Glucose, Bld: 104 mg/dL — ABNORMAL HIGH (ref 65–99)
Potassium: 2.6 mmol/L — CL (ref 3.5–5.1)
Sodium: 136 mmol/L (ref 135–145)

## 2014-11-19 LAB — CBC WITH DIFFERENTIAL/PLATELET
Basophils Absolute: 0 10*3/uL (ref 0.0–0.1)
Basophils Relative: 0 %
Eosinophils Absolute: 0.1 10*3/uL (ref 0.0–0.7)
Eosinophils Relative: 1 %
HCT: 34.7 % — ABNORMAL LOW (ref 36.0–46.0)
Hemoglobin: 11.5 g/dL — ABNORMAL LOW (ref 12.0–15.0)
Lymphocytes Relative: 20 %
Lymphs Abs: 1.5 10*3/uL (ref 0.7–4.0)
MCH: 30.6 pg (ref 26.0–34.0)
MCHC: 33.1 g/dL (ref 30.0–36.0)
MCV: 92.3 fL (ref 78.0–100.0)
Monocytes Absolute: 0.6 10*3/uL (ref 0.1–1.0)
Monocytes Relative: 8 %
Neutro Abs: 5.3 10*3/uL (ref 1.7–7.7)
Neutrophils Relative %: 71 %
Platelets: 249 10*3/uL (ref 150–400)
RBC: 3.76 MIL/uL — ABNORMAL LOW (ref 3.87–5.11)
RDW: 12.8 % (ref 11.5–15.5)
WBC: 7.4 10*3/uL (ref 4.0–10.5)

## 2014-11-19 LAB — TROPONIN I: Troponin I: <0.03 ng/mL (ref <0.031)

## 2014-11-19 MED ORDER — DIAZEPAM 5 MG/ML IJ SOLN
5.0000 mg | Freq: Once | INTRAMUSCULAR | Status: AC
  Administered 2014-11-19: 5 mg via INTRAVENOUS
  Filled 2014-11-19: qty 2

## 2014-11-19 MED ORDER — POTASSIUM CHLORIDE CRYS ER 20 MEQ PO TBCR
60.0000 meq | EXTENDED_RELEASE_TABLET | Freq: Once | ORAL | Status: AC
  Administered 2014-11-19: 60 meq via ORAL
  Filled 2014-11-19: qty 3

## 2014-11-19 MED ORDER — POTASSIUM CHLORIDE 10 MEQ/100ML IV SOLN
10.0000 meq | Freq: Once | INTRAVENOUS | Status: AC
  Administered 2014-11-19: 10 meq via INTRAVENOUS
  Filled 2014-11-19: qty 100

## 2014-11-19 MED ORDER — MORPHINE SULFATE (PF) 2 MG/ML IV SOLN
2.0000 mg | Freq: Once | INTRAVENOUS | Status: AC
  Administered 2014-11-19: 2 mg via INTRAVENOUS
  Filled 2014-11-19: qty 1

## 2014-11-19 MED ORDER — KETOROLAC TROMETHAMINE 15 MG/ML IJ SOLN
15.0000 mg | Freq: Once | INTRAMUSCULAR | Status: AC
  Administered 2014-11-19: 15 mg via INTRAVENOUS
  Filled 2014-11-19: qty 1

## 2014-11-19 NOTE — ED Notes (Signed)
Pt to department via EMS- reports left sided chest pain that started yesterday and got worse into today. Pt given 2 nitro and 324 ASA with EMS. Painful free on arrival. Bp-135/58 Hr-58 CBG-227 20g left forearm

## 2014-11-19 NOTE — Discharge Instructions (Signed)
Nonspecific Chest Pain  °Chest pain can be caused by many different conditions. There is always a chance that your pain could be related to something serious, such as a heart attack or a blood clot in your lungs. Chest pain can also be caused by conditions that are not life-threatening. If you have chest pain, it is very important to follow up with your health care provider. °CAUSES  °Chest pain can be caused by: °· Heartburn. °· Pneumonia or bronchitis. °· Anxiety or stress. °· Inflammation around your heart (pericarditis) or lung (pleuritis or pleurisy). °· A blood clot in your lung. °· A collapsed lung (pneumothorax). It can develop suddenly on its own (spontaneous pneumothorax) or from trauma to the chest. °· Shingles infection (varicella-zoster virus). °· Heart attack. °· Damage to the bones, muscles, and cartilage that make up your chest wall. This can include: °¨ Bruised bones due to injury. °¨ Strained muscles or cartilage due to frequent or repeated coughing or overwork. °¨ Fracture to one or more ribs. °¨ Sore cartilage due to inflammation (costochondritis). °RISK FACTORS  °Risk factors for chest pain may include: °· Activities that increase your risk for trauma or injury to your chest. °· Respiratory infections or conditions that cause frequent coughing. °· Medical conditions or overeating that can cause heartburn. °· Heart disease or family history of heart disease. °· Conditions or health behaviors that increase your risk of developing a blood clot. °· Having had chicken pox (varicella zoster). °SIGNS AND SYMPTOMS °Chest pain can feel like: °· Burning or tingling on the surface of your chest or deep in your chest. °· Crushing, pressure, aching, or squeezing pain. °· Dull or sharp pain that is worse when you move, cough, or take a deep breath. °· Pain that is also felt in your back, neck, shoulder, or arm, or pain that spreads to any of these areas. °Your chest pain may come and go, or it may stay  constant. °DIAGNOSIS °Lab tests or other studies may be needed to find the cause of your pain. Your health care provider may have you take a test called an ambulatory ECG (electrocardiogram). An ECG records your heartbeat patterns at the time the test is performed. You may also have other tests, such as: °· Transthoracic echocardiogram (TTE). During echocardiography, sound waves are used to create a picture of all of the heart structures and to look at how blood flows through your heart. °· Transesophageal echocardiogram (TEE). This is a more advanced imaging test that obtains images from inside your body. It allows your health care provider to see your heart in finer detail. °· Cardiac monitoring. This allows your health care provider to monitor your heart rate and rhythm in real time. °· Holter monitor. This is a portable device that records your heartbeat and can help to diagnose abnormal heartbeats. It allows your health care provider to track your heart activity for several days, if needed. °· Stress tests. These can be done through exercise or by taking medicine that makes your heart beat more quickly. °· Blood tests. °· Imaging tests. °TREATMENT  °Your treatment depends on what is causing your chest pain. Treatment may include: °· Medicines. These may include: °¨ Acid blockers for heartburn. °¨ Anti-inflammatory medicine. °¨ Pain medicine for inflammatory conditions. °¨ Antibiotic medicine, if an infection is present. °¨ Medicines to dissolve blood clots. °¨ Medicines to treat coronary artery disease. °· Supportive care for conditions that do not require medicines. This may include: °¨ Resting. °¨ Applying heat   or cold packs to injured areas. °¨ Limiting activities until pain decreases. °HOME CARE INSTRUCTIONS °· If you were prescribed an antibiotic medicine, finish it all even if you start to feel better. °· Avoid any activities that bring on chest pain. °· Do not use any tobacco products, including  cigarettes, chewing tobacco, or electronic cigarettes. If you need help quitting, ask your health care provider. °· Do not drink alcohol. °· Take medicines only as directed by your health care provider. °· Keep all follow-up visits as directed by your health care provider. This is important. This includes any further testing if your chest pain does not go away. °· If heartburn is the cause for your chest pain, you may be told to keep your head raised (elevated) while sleeping. This reduces the chance that acid will go from your stomach into your esophagus. °· Make lifestyle changes as directed by your health care provider. These may include: °¨ Getting regular exercise. Ask your health care provider to suggest some activities that are safe for you. °¨ Eating a heart-healthy diet. A registered dietitian can help you to learn healthy eating options. °¨ Maintaining a healthy weight. °¨ Managing diabetes, if necessary. °¨ Reducing stress. °SEEK MEDICAL CARE IF: °· Your chest pain does not go away after treatment. °· You have a rash with blisters on your chest. °· You have a fever. °SEEK IMMEDIATE MEDICAL CARE IF:  °· Your chest pain is worse. °· You have an increasing cough, or you cough up blood. °· You have severe abdominal pain. °· You have severe weakness. °· You faint. °· You have chills. °· You have sudden, unexplained chest discomfort. °· You have sudden, unexplained discomfort in your arms, back, neck, or jaw. °· You have shortness of breath at any time. °· You suddenly start to sweat, or your skin gets clammy. °· You feel nauseous or you vomit. °· You suddenly feel light-headed or dizzy. °· Your heart begins to beat quickly, or it feels like it is skipping beats. °These symptoms may represent a serious problem that is an emergency. Do not wait to see if the symptoms will go away. Get medical help right away. Call your local emergency services (911 in the [place] Do not drive yourself to the hospital. °  °This  information is not intended to replace advice given to you by your health care provider. Make sure you discuss any questions you have with your health care provider. °  °Document Released: 10/18/2004 Document Revised: 01/29/2014 Document Reviewed: 08/14/2013 °Elsevier Interactive Patient Education ©2016 [employer] ° °

## 2014-11-19 NOTE — ED Provider Notes (Signed)
CSN: [id number]     Arrival date & time 11/19/14  1354 History   First MD Initiated Contact with Patient 11/19/14 1408     Chief Complaint  Patient presents with  . Chest Pain     (Consider location/radiation/quality/duration/timing/severity/associated sxs/prior Treatment) HPI   69 year old female chest pain. Left-sided. Onset yesterday. Patient cannot remember which specifically doing she first noticed it. Worsening throughout today. Pain is in the left anterior chest and does not radiate. No appreciable exacerbating relieving factors. No respiratory complaints. Nausea. No diaphoresis. No palpitations. No unusual leg pain or swelling. 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 Showed a left heart cath at that time with normal coronaries and normal left ventricular function. Felt that on Sunday of that time was secondary to coronary artery vasospasm.     Past Medical History  Diagnosis Date  . Diabetes mellitus without complication (HCC)   . Hypertension   . Stroke (HCC)   . GERD 09/04/2006    Qualifier: Diagnosis of  By: Duke Salvia    . History of echocardiogram     a. 2D ECHO: 11/06/2013 EF 65%; no WMA. Mild TR. PA pk pressure 43 mm HG   Past Surgical History  Procedure Laterality Date  . Abdominal hysterectomy    . Left heart catheterization with coronary angiogram N/A 11/05/2013    Procedure: LEFT HEART CATHETERIZATION WITH CORONARY ANGIOGRAM;  Surgeon: Lennette Bihari, MD;  Location: [facility] CATH LAB;  Service: Cardiovascular;  Laterality: N/A;   History reviewed. No pertinent family history. Social History  Substance Use Topics  . Smoking status: Never Smoker   . Smokeless tobacco: None  . Alcohol Use: No   OB History    No data available     Review of Systems  All systems reviewed and negative, other than as noted in HPI.   Allergies  Codeine; Ibuprofen; and Imdur  Home Medications   Prior to Admission medications   Medication Sig Start Date End  Date Taking? Authorizing Provider  amLODipine (NORVASC) 5 MG tablet Take 1 tablet (5 mg total) by mouth daily. 11/06/13  Yes Janetta Hora, PA-C  atenolol-chlorthalidone (TENORETIC) 100-25 MG per tablet Take 1 tablet by mouth daily.   Yes Historical Provider, MD  atorvastatin (LIPITOR) 10 MG tablet Take 1 tablet (10 mg total) by mouth daily at 6 PM. 06/27/12  Yes Mcarthur Rossetti Angiulli, PA-C  cloNIDine (CATAPRES) 0.2 MG tablet Take 0.2 mg by mouth 2 (two) times daily.   Yes Historical Provider, MD  clopidogrel (PLAVIX) 75 MG tablet Take 75 mg by mouth daily.   Yes Historical Provider, MD  ezetimibe (ZETIA) 10 MG tablet Take 10 mg by mouth daily.   Yes Historical Provider, MD  hydrALAZINE (APRESOLINE) 50 MG tablet Take 50 mg by mouth 2 (two) times daily.   Yes Historical Provider, MD  ALPRAZolam (XANAX) 0.25 MG tablet Take 1 tablet (0.25 mg total) by mouth at bedtime as needed for sleep. 05/12/13   Quita Skye, MD  glipiZIDE-metformin (METAGLIP) 5-500 MG per tablet Take 1 tablet by mouth at bedtime. 11/07/13   Janetta Hora, PA-C  hydrALAZINE (APRESOLINE) 100 MG tablet Take 50 mg by mouth 2 (two) times daily.     Historical Provider, MD  nitroGLYCERIN (NITROSTAT) 0.4 MG SL tablet Place 1 tablet (0.4 mg total) under the tongue every 5 (five) minutes x 3 doses as needed for chest pain. 11/06/13   Janetta Hora, PA-C  traMADol (ULTRAM) 50 MG tablet  Take 1 tablet (50 mg total) by mouth every 12 (twelve) hours as needed for moderate pain. 11/06/13   Janetta HoraKathryn R Thompson, PA-C  traMADol (ULTRAM) 50 MG tablet Take 1 tablet (50 mg total) by mouth every 6 (six) hours as needed. 12/19/13   Courtney Forcucci, PA-C   BP 146/56 mmHg  Pulse 54  Temp(Src) 98.5 F (36.9 C) (Oral)  Resp 20  Wt 155 lb (70.308 kg)  SpO2 99% Physical Exam  Constitutional: She appears well-developed and well-nourished. No distress.  HENT:  Head: Normocephalic and atraumatic.  Eyes: Conjunctivae are normal. Right eye  exhibits no discharge. Left eye exhibits no discharge.  Neck: Neck supple.  Cardiovascular: Regular rhythm and normal heart sounds.  Exam reveals no gallop and no friction rub.   No murmur heard. Bradycardia  Pulmonary/Chest: Effort normal and breath sounds normal. No respiratory distress.  Abdominal: Soft. She exhibits no distension. There is no tenderness.  Musculoskeletal: She exhibits no edema or tenderness.  Lower extremities symmetric as compared to each other. No calf tenderness. Negative Homan's. No palpable cords.   Neurological: She is alert.  Skin: Skin is warm and dry.  Psychiatric: She has a normal mood and affect. Her behavior is normal. Thought content normal.  Nursing note and vitals reviewed.   ED Course  Procedures (including critical care time) Labs Review Labs Reviewed  CBC WITH DIFFERENTIAL/PLATELET - Abnormal; Notable for the following:    RBC 3.76 (*)    Hemoglobin 11.5 (*)    HCT 34.7 (*)    All other components within normal limits  BASIC METABOLIC PANEL - Abnormal; Notable for the following:    Potassium 2.6 (*)    Chloride 96 (*)    Glucose, Bld 104 (*)    Creatinine, Ser 1.03 (*)    Calcium 8.8 (*)    GFR calc non Af Amer 55 (*)    All other components within normal limits  TROPONIN I    Imaging Review No results found.   Dg Chest 2 View  11/19/2014  CLINICAL DATA:  Chest pain for 2 days EXAM: CHEST  2 VIEW COMPARISON:  Chest CT 09/11/2014 FINDINGS: Normal cardiac silhouette. Chronic bronchitic markings centrally. No effusion, infiltrate, or pneumothorax. IMPRESSION: No acute cardiopulmonary process. Electronically Signed   By: Genevive BiStewart  Edmunds M.D.   On: 11/19/2014 15:08   I have personally reviewed and evaluated these images and lab results as part of my medical decision-making.   EKG Interpretation   Date/Time:  Friday November 19 2014 13:58:03 EDT Ventricular Rate:  56 PR Interval:  137 QRS Duration: 75 QT Interval:  474 QTC  Calculation: 457 R Axis:   70 Text Interpretation:  Sinus rhythm Borderline repolarization abnormality  No significant change since last tracing Confirmed by Lissa Rowles  MD, Carloyn Lahue  ([id number]) on 11/19/2014 2:09:20 PM      MDM   Final diagnoses:  Atypical chest pain  Hypokalemia    69 year old female chest pain. Atypical for ACS. EKG is unchanged from priors. Prior heart catheterization 1 year ago with normal coronary arteries. Workup today has been pretty unremarkable aside from hypokalemia. This appears to be an ongoing issue for her. She was given both oral and IV potassium. It has been determined that no acute conditions requiring further emergency intervention are present at this time. The patient has been advised of the diagnosis and plan. I reviewed any labs and imaging including any potential incidental findings. We have discussed signs and symptoms that warrant return  to the ED and they are listed in the discharge instructions.      Raeford Razor, MD 11/28/14 [phone]

## 2015-03-01 ENCOUNTER — Emergency Department (HOSPITAL_COMMUNITY): Payer: Medicare Other

## 2015-03-01 ENCOUNTER — Encounter (HOSPITAL_COMMUNITY): Payer: Self-pay

## 2015-03-01 ENCOUNTER — Emergency Department (HOSPITAL_COMMUNITY)
Admission: EM | Admit: 2015-03-01 | Discharge: 2015-03-02 | Disposition: A | Discharge status: Home / Self Care | Payer: Medicare Other | Attending: Emergency Medicine | Admitting: Emergency Medicine

## 2015-03-01 DIAGNOSIS — R0789 Other chest pain: Secondary | ICD-10-CM | POA: Diagnosis not present

## 2015-03-01 DIAGNOSIS — Z79899 Other long term (current) drug therapy: Secondary | ICD-10-CM | POA: Insufficient documentation

## 2015-03-01 DIAGNOSIS — Z7902 Long term (current) use of antithrombotics/antiplatelets: Secondary | ICD-10-CM | POA: Diagnosis not present

## 2015-03-01 DIAGNOSIS — Z8673 Personal history of transient ischemic attack (TIA), and cerebral infarction without residual deficits: Secondary | ICD-10-CM | POA: Insufficient documentation

## 2015-03-01 DIAGNOSIS — E119 Type 2 diabetes mellitus without complications: Secondary | ICD-10-CM | POA: Diagnosis not present

## 2015-03-01 DIAGNOSIS — I10 Essential (primary) hypertension: Secondary | ICD-10-CM | POA: Insufficient documentation

## 2015-03-01 DIAGNOSIS — K219 Gastro-esophageal reflux disease without esophagitis: Secondary | ICD-10-CM | POA: Diagnosis not present

## 2015-03-01 DIAGNOSIS — R079 Chest pain, unspecified: Secondary | ICD-10-CM | POA: Diagnosis present

## 2015-03-01 DIAGNOSIS — F419 Anxiety disorder, unspecified: Secondary | ICD-10-CM | POA: Diagnosis not present

## 2015-03-01 DIAGNOSIS — Z7984 Long term (current) use of oral hypoglycemic drugs: Secondary | ICD-10-CM | POA: Insufficient documentation

## 2015-03-01 LAB — CBC
HCT: 36 % (ref 36.0–46.0)
Hemoglobin: 11.4 g/dL — ABNORMAL LOW (ref 12.0–15.0)
MCH: 28.8 pg (ref 26.0–34.0)
MCHC: 31.7 g/dL (ref 30.0–36.0)
MCV: 90.9 fL (ref 78.0–100.0)
Platelets: 227 10*3/uL (ref 150–400)
RBC: 3.96 MIL/uL (ref 3.87–5.11)
RDW: 13.5 % (ref 11.5–15.5)
WBC: 7.8 10*3/uL (ref 4.0–10.5)

## 2015-03-01 LAB — BASIC METABOLIC PANEL
Anion gap: 14 (ref 5–15)
BUN: 8 mg/dL (ref 6–20)
CO2: 23 mmol/L (ref 22–32)
Calcium: 9.1 mg/dL (ref 8.9–10.3)
Chloride: 103 mmol/L (ref 101–111)
Creatinine, Ser: 1.01 mg/dL — ABNORMAL HIGH (ref 0.44–1.00)
GFR calc Af Amer: >60 mL/min (ref >60)
GFR calc non Af Amer: 56 mL/min — ABNORMAL LOW (ref >60)
Glucose, Bld: 175 mg/dL — ABNORMAL HIGH (ref 65–99)
Potassium: 3.5 mmol/L (ref 3.5–5.1)
Sodium: 140 mmol/L (ref 135–145)

## 2015-03-01 LAB — I-STAT TROPONIN, ED: Troponin i, poc: 0.01 ng/mL (ref 0.00–0.08)

## 2015-03-01 MED ORDER — OXYCODONE-ACETAMINOPHEN 5-325 MG PO TABS
1.0000 | ORAL_TABLET | Freq: Once | ORAL | Status: AC
  Administered 2015-03-01: 1 via ORAL

## 2015-03-01 MED ORDER — OXYCODONE-ACETAMINOPHEN 5-325 MG PO TABS
ORAL_TABLET | ORAL | Status: AC
  Filled 2015-03-01: qty 1

## 2015-03-01 NOTE — ED Notes (Signed)
PER EMS: pt brought to triage from EMS. Pt reports left sided CP that started today but not sure exactly what time but states it was during the daylight. She is crying in triage stating, 'I just dont feel good" and does not describe symptoms. Pt concerned that she is taking too much medications. BP-170 systolic, HR-88, last BP 152/74. 99% RA, A&OX4. CBG - 179

## 2015-03-02 DIAGNOSIS — R0789 Other chest pain: Secondary | ICD-10-CM | POA: Diagnosis not present

## 2015-03-02 LAB — I-STAT TROPONIN, ED: Troponin i, poc: 0.02 ng/mL (ref 0.00–0.08)

## 2015-03-02 MED ORDER — KETOROLAC TROMETHAMINE 60 MG/2ML IM SOLN
INTRAMUSCULAR | Status: AC
  Administered 2015-03-02: 02:00:00
  Filled 2015-03-02: qty 2

## 2015-03-02 MED ORDER — CYCLOBENZAPRINE HCL 10 MG PO TABS
ORAL_TABLET | ORAL | Status: AC
  Administered 2015-03-02: 02:00:00
  Filled 2015-03-02: qty 1

## 2015-03-02 MED ORDER — CYCLOBENZAPRINE HCL 5 MG PO TABS: 5.0000 mg | ORAL_TABLET | Freq: Three times a day (TID) | ORAL | Status: DC | PRN

## 2015-03-02 NOTE — ED Notes (Signed)
Pt states that she started taking two new medications today zetia and omeprazole and afterwards, started having palpations.

## 2015-03-02 NOTE — ED Provider Notes (Signed)
CSN: [id number]     Arrival date & time 03/01/15  2016 History  By signing my name below, I, [name], attest that this documentation has been prepared under the direction and in the presence of Adriana Albe, MD at 0030. Electronically Signed: Soijett [name], ED Scribe. 03/02/2015. 2:26 AM.   Chief Complaint  Patient presents with  . Chest Pain     The history is provided by the patient. No language interpreter was used.    HPI Comments: [name] is a 70 y.o. female with a medical hx of DM, HTN, stroke, GERD, who presents to the Emergency Department via EMS complaining of left sided, pressure CP  this evening PTA. Pt notes that her CP began following seeing her PCP today when she was prescribed to new medications. She notes that a few minutes after she took the medications, her heart began to start racing and beating hard with CP at the same time. Pt notes that there are no worsening or alleviating factors. Pt has not tried any medications for the relief of her symptoms. Pt denies n/v, diaphoresis, SOB, and any other symptoms. Pt is unsure of the name of her PCP at this time, due to her PCP being new. Dr. Barnabas Lister used to be her PCP. Pt reports that she does live alone and she doesn't use a walker but has difficulty walking. Pt reports that she had a stroke and MI last year.    Past Medical History  Diagnosis Date  . Diabetes mellitus without complication (HCC)   . Hypertension   . Stroke (HCC)   . GERD 09/04/2006    Qualifier: Diagnosis of  By: Duke Salvia    . History of echocardiogram     a. 2D ECHO: 11/06/2013 EF 65%; no WMA. Mild TR. PA pk pressure 43 mm HG   Past Surgical History  Procedure Laterality Date  . Abdominal hysterectomy    . Left heart catheterization with coronary angiogram N/A 11/05/2013    Procedure: LEFT HEART CATHETERIZATION WITH CORONARY ANGIOGRAM;  Surgeon: Lennette Bihari, MD;  Location: [facility] CATH LAB;  Service: Cardiovascular;  Laterality: N/A;   No family  history on file. Social History  Substance Use Topics  . Smoking status: Never Smoker   . Smokeless tobacco: None  . Alcohol Use: No  lives alone  OB History    No data available     Review of Systems  Constitutional: Negative for diaphoresis.  Respiratory: Negative for shortness of breath.   Cardiovascular: Positive for chest pain.  Gastrointestinal: Negative for vomiting.  All other systems reviewed and are negative.     Allergies  Codeine; Ibuprofen; and Imdur  Home Medications   Prior to Admission medications   Medication Sig Start Date End Date Taking? Authorizing Provider  amLODipine (NORVASC) 5 MG tablet Take 1 tablet (5 mg total) by mouth daily. 11/06/13  Yes Janetta Hora, PA-C  atorvastatin (LIPITOR) 10 MG tablet Take 1 tablet (10 mg total) by mouth daily at 6 PM. 06/27/12  Yes Mcarthur Rossetti Angiulli, PA-C  cloNIDine (CATAPRES) 0.2 MG tablet Take 0.2 mg by mouth 2 (two) times daily.   Yes Historical Provider, MD  clopidogrel (PLAVIX) 75 MG tablet Take 75 mg by mouth daily.   Yes Historical Provider, MD  diphenhydramine-acetaminophen (TYLENOL PM) 25-500 MG TABS tablet Take 1 tablet by mouth at bedtime as needed (sleep).   Yes Historical Provider, MD  ezetimibe (ZETIA) 10 MG tablet Take 10 mg by mouth daily.  Yes Historical Provider, MD  glipiZIDE-metformin (METAGLIP) 5-500 MG tablet Take 1 tablet by mouth at bedtime.   Yes Historical Provider, MD  hydrALAZINE (APRESOLINE) 50 MG tablet Take 50 mg by mouth 2 (two) times daily.   Yes Historical Provider, MD  nitroGLYCERIN (NITROSTAT) 0.4 MG SL tablet Place 1 tablet (0.4 mg total) under the tongue every 5 (five) minutes x 3 doses as needed for chest pain. 11/06/13  Yes Janetta Hora, PA-C  omeprazole (PRILOSEC) 20 MG capsule Take 20 mg by mouth daily.   Yes Historical Provider, MD  traZODone (DESYREL) 50 MG tablet Take 50 mg by mouth at bedtime.   Yes Historical Provider, MD  cyclobenzaprine (FLEXERIL) 5 MG tablet  Take 1 tablet (5 mg total) by mouth 3 (three) times daily as needed (muscle soreness). 03/02/15   Adriana Albe, MD   BP 161/73 mmHg  Pulse 68  Temp(Src) 98.5 F (36.9 C) (Oral)  Resp 16  SpO2 99%  Vital signs normal except for hypertension  Physical Exam  Constitutional: She is oriented to person, place, and time. She appears well-developed and well-nourished.  Non-toxic appearance. She does not appear ill. No distress.  Moaning with every breath. Pt seems upset and anxious.  HENT:  Head: Normocephalic and atraumatic.  Right Ear: External ear normal.  Left Ear: External ear normal.  Nose: Nose normal. No mucosal edema or rhinorrhea.  Mouth/Throat: Oropharynx is clear and moist and mucous membranes are normal. No dental abscesses or uvula swelling.  Eyes: Conjunctivae and EOM are normal. Pupils are equal, round, and reactive to light.  Neck: Normal range of motion and full passive range of motion without pain. Neck supple.  Cardiovascular: Normal rate, regular rhythm and normal heart sounds.  Exam reveals no gallop and no friction rub.   No murmur heard. Pulmonary/Chest: Effort normal and breath sounds normal. No respiratory distress. She has no wheezes. She has no rhonchi. She has no rales. She exhibits no tenderness and no crepitus.  tender to the left costal chondral junctions.   Abdominal: Soft. Normal appearance and bowel sounds are normal. She exhibits no distension. There is no tenderness. There is no rebound and no guarding.  Musculoskeletal: Normal range of motion. She exhibits no edema or tenderness.  Moves all extremities well.   Neurological: She is alert and oriented to person, place, and time. She has normal strength. No cranial nerve deficit.  Skin: Skin is warm, dry and intact. No rash noted. No erythema. No pallor.  Psychiatric: Her mood appears anxious. Her speech is rapid and/or pressured. She is agitated.  upset  Nursing note and vitals reviewed.   ED Course   Procedures (including critical care time)  Medications  oxyCODONE-acetaminophen (PERCOCET/ROXICET) 5-325 MG per tablet 1 tablet (1 tablet Oral Given 03/01/15 2208)  cyclobenzaprine (FLEXERIL) 10 MG tablet (  Given 03/02/15 0220)  ketorolac (TORADOL) 60 MG/2ML injection (  Given 03/02/15 0221)    DIAGNOSTIC STUDIES: Oxygen Saturation is 99% on RA, nl by my interpretation.    COORDINATION OF CARE: 12:43 AM Discussed treatment plan with pt at bedside which includes labs, CXR, EKG, and follow up with PCP and pt agreed to plan.   Patient had dealt her troponins done which were negative. She got pain relief with the IV Toradol and Flexeril. We discussed this is chest wall pain and she can go home and follow-up with her PCP about whether she wants her to continue the 2 new medications.   Results for orders  placed or performed during the hospital encounter of 03/01/15  Basic metabolic panel  Result Value Ref Range   Sodium 140 135 - 145 mmol/L   Potassium 3.5 3.5 - 5.1 mmol/L   Chloride 103 101 - 111 mmol/L   CO2 23 22 - 32 mmol/L   Glucose, Bld 175 (H) 65 - 99 mg/dL   BUN 8 6 - 20 mg/dL   Creatinine, Ser 1.61 (H) 0.44 - 1.00 mg/dL   Calcium 9.1 8.9 - 09.6 mg/dL   GFR calc non Af Amer 56 (L) >60 mL/min   GFR calc Af Amer >60 >60 mL/min   Anion gap 14 5 - 15  CBC  Result Value Ref Range   WBC 7.8 4.0 - 10.5 K/uL   RBC 3.96 3.87 - 5.11 MIL/uL   Hemoglobin 11.4 (L) 12.0 - 15.0 g/dL   HCT 04.5 40.9 - 81.1 %   MCV 90.9 78.0 - 100.0 fL   MCH 28.8 26.0 - 34.0 pg   MCHC 31.7 30.0 - 36.0 g/dL   RDW 91.4 78.2 - 95.6 %   Platelets 227 150 - 400 K/uL  I-stat troponin, ED (not at [facility], [facility])  Result Value Ref Range   Troponin i, poc 0.01 0.00 - 0.08 ng/mL   Comment 3          I-stat troponin, ED  Result Value Ref Range   Troponin i, poc 0.02 0.00 - 0.08 ng/mL   Comment 3           Laboratory interpretation all normal   Dg Chest 2 View  03/01/2015  CLINICAL DATA:  Acute onset of  generalized chest pain and shortness of breath. Initial encounter. EXAM: CHEST  2 VIEW COMPARISON:  Chest radiograph from 11/19/2014 FINDINGS: The lungs are well-aerated. Mild peribronchial thickening is noted. There is no evidence of focal opacification, pleural effusion or pneumothorax. The heart is borderline normal in size. No acute osseous abnormalities are seen. IMPRESSION: Mild peribronchial thickening noted.  Lungs otherwise clear. Electronically Signed   By: Roanna Raider M.D.   On: 03/01/2015 21:20     I have personally reviewed and evaluated these images and lab results as part of my medical decision-making.   EKG Interpretation   Date/Time:  Tuesday March 01 2015 20:21:09 EST Ventricular Rate:  87 PR Interval:  122 QRS Duration: 64 QT Interval:  390 QTC Calculation: 469 R Axis:   74 Text Interpretation:  Normal sinus rhythm Normal ECG No significant change  since last tracing 19 Nov 2014 Confirmed by [facility]  MD-I, Ocean Kearley ([id number]) on  03/02/2015 12:02:44 AM      MDM   Final diagnoses:  Atypical chest pain  Chest wall pain   New Prescriptions   CYCLOBENZAPRINE (FLEXERIL) 5 MG TABLET    Take 1 tablet (5 mg total) by mouth 3 (three) times daily as needed (muscle soreness).    Plan discharge  Adriana Albe, MD, FACEP    I personally performed the services described in this documentation, which was scribed in my presence. The recorded information has been reviewed and considered.  Adriana Albe, MD, Concha Pyo, MD 03/02/15 [phone]

## 2015-03-02 NOTE — Discharge Instructions (Signed)
Use ice and heat on your chest. Take the flexeril for your sore chest muscles. Follow up with your doctor about continuing to take the new medications. Recheck as needed.    Chest Wall Pain Chest wall pain is pain in or around the bones and muscles of your chest. Sometimes, an injury causes this pain. Sometimes, the cause may not be known. This pain may take several weeks or longer to get better. HOME CARE Pay attention to any changes in your symptoms. Take these actions to help with your pain:  Rest as told by your doctor.  Avoid activities that cause pain. Try not to use your chest, belly (abdominal), or side muscles to lift heavy things.  If directed, apply ice to the painful area:  Put ice in a plastic bag.  Place a towel between your skin and the bag.  Leave the ice on for 20 minutes, 2-3 times per day.  Take over-the-counter and prescription medicines only as told by your doctor.  Do not use tobacco products, including cigarettes, chewing tobacco, and e-cigarettes. If you need help quitting, ask your doctor.  Keep all follow-up visits as told by your doctor. This is important. GET HELP IF:  You have a fever.  Your chest pain gets worse.  You have new symptoms. GET HELP RIGHT AWAY IF:  You feel sick to your stomach (nauseous) or you throw up (vomit).  You feel sweaty or light-headed.  You have a cough with phlegm (sputum) or you cough up blood.  You are short of breath.   This information is not intended to replace advice given to you by your health care provider. Make sure you discuss any questions you have with your health care provider.   Document Released: 06/27/2007 Document Revised: 09/29/2014 Document Reviewed: 04/05/2014 Elsevier Interactive Patient Education [employer]

## 2015-03-24 ENCOUNTER — Emergency Department (HOSPITAL_COMMUNITY)
Admission: EM | Admit: 2015-03-24 | Discharge: 2015-03-24 | Disposition: A | Discharge status: Home / Self Care | Payer: Medicare Other | Attending: Emergency Medicine | Admitting: Emergency Medicine

## 2015-03-24 ENCOUNTER — Emergency Department (HOSPITAL_COMMUNITY): Payer: Medicare Other

## 2015-03-24 ENCOUNTER — Encounter (HOSPITAL_COMMUNITY): Payer: Self-pay

## 2015-03-24 DIAGNOSIS — R079 Chest pain, unspecified: Secondary | ICD-10-CM | POA: Diagnosis present

## 2015-03-24 DIAGNOSIS — E119 Type 2 diabetes mellitus without complications: Secondary | ICD-10-CM | POA: Insufficient documentation

## 2015-03-24 DIAGNOSIS — Z8673 Personal history of transient ischemic attack (TIA), and cerebral infarction without residual deficits: Secondary | ICD-10-CM | POA: Insufficient documentation

## 2015-03-24 DIAGNOSIS — K219 Gastro-esophageal reflux disease without esophagitis: Secondary | ICD-10-CM | POA: Diagnosis not present

## 2015-03-24 DIAGNOSIS — Z7902 Long term (current) use of antithrombotics/antiplatelets: Secondary | ICD-10-CM | POA: Diagnosis not present

## 2015-03-24 DIAGNOSIS — Z7984 Long term (current) use of oral hypoglycemic drugs: Secondary | ICD-10-CM | POA: Insufficient documentation

## 2015-03-24 DIAGNOSIS — Z9889 Other specified postprocedural states: Secondary | ICD-10-CM | POA: Diagnosis not present

## 2015-03-24 DIAGNOSIS — I10 Essential (primary) hypertension: Secondary | ICD-10-CM | POA: Insufficient documentation

## 2015-03-24 DIAGNOSIS — Z79899 Other long term (current) drug therapy: Secondary | ICD-10-CM | POA: Insufficient documentation

## 2015-03-24 DIAGNOSIS — R0789 Other chest pain: Secondary | ICD-10-CM | POA: Diagnosis not present

## 2015-03-24 LAB — BASIC METABOLIC PANEL
Anion gap: 10 (ref 5–15)
BUN: 7 mg/dL (ref 6–20)
CO2: 24 mmol/L (ref 22–32)
Calcium: 8.9 mg/dL (ref 8.9–10.3)
Chloride: 106 mmol/L (ref 101–111)
Creatinine, Ser: 0.99 mg/dL (ref 0.44–1.00)
GFR calc Af Amer: >60 mL/min (ref >60)
GFR calc non Af Amer: 57 mL/min — ABNORMAL LOW (ref >60)
Glucose, Bld: 186 mg/dL — ABNORMAL HIGH (ref 65–99)
Potassium: 3.8 mmol/L (ref 3.5–5.1)
Sodium: 140 mmol/L (ref 135–145)

## 2015-03-24 LAB — CBC
HCT: 37.2 % (ref 36.0–46.0)
Hemoglobin: 12.2 g/dL (ref 12.0–15.0)
MCH: 30 pg (ref 26.0–34.0)
MCHC: 32.8 g/dL (ref 30.0–36.0)
MCV: 91.4 fL (ref 78.0–100.0)
Platelets: 240 10*3/uL (ref 150–400)
RBC: 4.07 MIL/uL (ref 3.87–5.11)
RDW: 13.6 % (ref 11.5–15.5)
WBC: 7.2 10*3/uL (ref 4.0–10.5)

## 2015-03-24 LAB — I-STAT TROPONIN, ED: Troponin i, poc: 0 ng/mL (ref 0.00–0.08)

## 2015-03-24 MED ORDER — AMLODIPINE BESYLATE 5 MG PO TABS
5.0000 mg | ORAL_TABLET | Freq: Once | ORAL | Status: AC
  Administered 2015-03-24: 5 mg via ORAL
  Filled 2015-03-24: qty 1

## 2015-03-24 MED ORDER — ONDANSETRON HCL 4 MG/2ML IJ SOLN
4.0000 mg | Freq: Once | INTRAMUSCULAR | Status: AC
  Administered 2015-03-24: 4 mg via INTRAVENOUS
  Filled 2015-03-24: qty 2

## 2015-03-24 MED ORDER — SODIUM CHLORIDE 0.9 % IV BOLUS (SEPSIS)
500.0000 mL | Freq: Once | INTRAVENOUS | Status: AC
  Administered 2015-03-24: 500 mL via INTRAVENOUS

## 2015-03-24 MED ORDER — METHOCARBAMOL 500 MG PO TABS: 500.0000 mg | ORAL_TABLET | Freq: Three times a day (TID) | ORAL | Status: DC | PRN

## 2015-03-24 MED ORDER — MORPHINE SULFATE (PF) 4 MG/ML IV SOLN
4.0000 mg | Freq: Once | INTRAVENOUS | Status: AC
  Administered 2015-03-24: 4 mg via INTRAVENOUS
  Filled 2015-03-24: qty 1

## 2015-03-24 NOTE — ED Notes (Signed)
Patient here with intermittent CP x 1 week reports that she is taking her SL NTG with no relief, reports that it hurts to touch and hurts to move

## 2015-03-24 NOTE — ED Provider Notes (Signed)
CSN: [id number]     Arrival date & time 03/24/15  1203 History   First MD Initiated Contact with Patient 03/24/15 1519     Chief Complaint  Patient presents with  . Chest Pain     (Consider location/radiation/quality/duration/timing/severity/associated sxs/prior Treatment) HPI   Pt with hx DM, HTN, stroke p/w left sided body pain that has been intermittent since last year and constant since last week.  Reports worst pain is in her left chest and armpit, pain is sharp.  Has had nighttime cough.  Has been taking nitroglycerin without improvement, but it has caused headache.  Denies lightheadedness, dizzines, SOB, abdominal pain, bowel changes, weakness or numbness of the extremities.  Denies nipple discharge or any masses palpated.   Past Medical History  Diagnosis Date  . Diabetes mellitus without complication (HCC)   . Hypertension   . Stroke (HCC)   . GERD 09/04/2006    Qualifier: Diagnosis of  By: Duke Salvia    . History of echocardiogram     a. 2D ECHO: 11/06/2013 EF 65%; no WMA. Mild TR. PA pk pressure 43 mm HG   Past Surgical History  Procedure Laterality Date  . Abdominal hysterectomy    . Left heart catheterization with coronary angiogram N/A 11/05/2013    Procedure: LEFT HEART CATHETERIZATION WITH CORONARY ANGIOGRAM;  Surgeon: Lennette Bihari, MD;  Location: [facility] CATH LAB;  Service: Cardiovascular;  Laterality: N/A;   No family history on file. Social History  Substance Use Topics  . Smoking status: Never Smoker   . Smokeless tobacco: None  . Alcohol Use: No   OB History    No data available     Review of Systems  All other systems reviewed and are negative.     Allergies  Codeine; Ibuprofen; and Imdur  Home Medications   Prior to Admission medications   Medication Sig Start Date End Date Taking? Authorizing Provider  amLODipine (NORVASC) 5 MG tablet Take 1 tablet (5 mg total) by mouth daily. 11/06/13   Janetta Hora, PA-C  atorvastatin (LIPITOR) 10  MG tablet Take 1 tablet (10 mg total) by mouth daily at 6 PM. 06/27/12   Mcarthur Rossetti Angiulli, PA-C  cloNIDine (CATAPRES) 0.2 MG tablet Take 0.2 mg by mouth 2 (two) times daily.    Historical Provider, MD  clopidogrel (PLAVIX) 75 MG tablet Take 75 mg by mouth daily.    Historical Provider, MD  cyclobenzaprine (FLEXERIL) 5 MG tablet Take 1 tablet (5 mg total) by mouth 3 (three) times daily as needed (muscle soreness). 03/02/15   Devoria Albe, MD  diphenhydramine-acetaminophen (TYLENOL PM) 25-500 MG TABS tablet Take 1 tablet by mouth at bedtime as needed (sleep).    Historical Provider, MD  ezetimibe (ZETIA) 10 MG tablet Take 10 mg by mouth daily.    Historical Provider, MD  glipiZIDE-metformin (METAGLIP) 5-500 MG tablet Take 1 tablet by mouth at bedtime.    Historical Provider, MD  hydrALAZINE (APRESOLINE) 50 MG tablet Take 50 mg by mouth 2 (two) times daily.    Historical Provider, MD  nitroGLYCERIN (NITROSTAT) 0.4 MG SL tablet Place 1 tablet (0.4 mg total) under the tongue every 5 (five) minutes x 3 doses as needed for chest pain. 11/06/13   Janetta Hora, PA-C  omeprazole (PRILOSEC) 20 MG capsule Take 20 mg by mouth daily.    Historical Provider, MD  traZODone (DESYREL) 50 MG tablet Take 50 mg by mouth at bedtime.    Historical Provider, MD  BP 219/96 mmHg  Pulse 78  Temp(Src) 98.2 F (36.8 C) (Oral)  Resp 19  Wt 72.576 kg  SpO2 100% Physical Exam  Constitutional: She appears well-developed and well-nourished. No distress.  HENT:  Head: Normocephalic and atraumatic.  Neck: Neck supple.  Cardiovascular: Normal rate and regular rhythm.   Pulmonary/Chest: Effort normal and breath sounds normal. No respiratory distress. She has no wheezes. She has no rales. She exhibits tenderness (left chest wall tenderness and fullness of left upper breast tissue.  No overlying erythema or warmth ).  Significant tenderness over left chest wall.  No erythema, warmth, induration, or fluctuance.    Abdominal:  Soft. She exhibits no distension. There is no tenderness. There is no rebound and no guarding.  Musculoskeletal:       Arms: Spine nontender, no crepitus, or stepoffs.   Lymphadenopathy:       Left axillary: No pectoral and no lateral adenopathy present. Neurological: She is alert.  Moves all extremities equally  Upper extremities:  Strength 5/5, sensation intact, distal pulses intact.     Skin: She is not diaphoretic.  Nursing note and vitals reviewed.   ED Course  Procedures (including critical care time) Labs Review Labs Reviewed  BASIC METABOLIC PANEL - Abnormal; Notable for the following:    Glucose, Bld 186 (*)    GFR calc non Af Amer 57 (*)    All other components within normal limits  CBC  I-STAT TROPOININ, ED    Imaging Review Dg Chest 2 View  03/24/2015  CLINICAL DATA:  Chest pain. EXAM: CHEST  2 VIEW COMPARISON:  March 01, 2015. FINDINGS: The heart size and mediastinal contours are within normal limits. Both lungs are clear. No pneumothorax or pleural effusion is noted. The visualized skeletal structures are unremarkable. IMPRESSION: No active cardiopulmonary disease. Electronically Signed   By: Lupita Raider, M.D.   On: 03/24/2015 12:46    EKG Interpretation None       ED ECG REPORT   Date: 03/24/2015  Rate: 92  Rhythm: normal sinus rhythm  QRS Axis: normal  Intervals: normal  ST/T Wave abnormalities: nonspecific ST changes  Conduction Disutrbances:none  Narrative Interpretation:   Old EKG Reviewed: none available  I have personally reviewed the EKG tracing and agree with the computerized printout as noted.    MDM   Final diagnoses:  Left-sided chest wall pain  Essential hypertension    Afebrile, nontoxic patient with left sided body pain x 1 year and one week of left chest wall and left upper back pain, tenderness without skin changes.  No known injury.  No weakness or exam.  No red flags.  Doubt ACS, PE, PNA.  Workup reassuring.  Pt advised to  follow closely with her PCP who is a female Radio broadcast assistant on [facility], pt unable to remember name.   Pt given home HTN medication with improvement of BP.  Pt also seen and examined by Dr Patria Mane.  D/C home with medication for muscle soreness.  Discussed result, findings, treatment, and follow up  with patient.  Pt given return precautions.  Pt verbalizes understanding and agrees with plan.         Trixie Dredge, PA-C 03/24/15 1942  Azalia Bilis, MD 03/25/15 [phone]

## 2015-03-24 NOTE — Discharge Instructions (Signed)
Read the information below.  Use the prescribed medication as directed.  Please discuss all new medications with your pharmacist.  You may return to the Emergency Department at any time for worsening condition or any new symptoms that concern you.  If you develop worsening chest pain, shortness of breath, fever, you pass out, or become weak or dizzy, return to the ER for a recheck.     Please monitor your blood pressure closely, take all of your home medications, and follow closely with your primary care provider.  Please have your left breast tenderness rechecked within the next 2 weeks.    Chest Wall Pain Chest wall pain is pain in or around the bones and muscles of your chest. Sometimes, an injury causes this pain. Sometimes, the cause may not be known. This pain may take several weeks or longer to get better. HOME CARE INSTRUCTIONS  Pay attention to any changes in your symptoms. Take these actions to help with your pain:   Rest as told by your health care provider.   Avoid activities that cause pain. These include any activities that use your chest muscles or your abdominal and side muscles to lift heavy items.   If directed, apply ice to the painful area:  Put ice in a plastic bag.  Place a towel between your skin and the bag.  Leave the ice on for 20 minutes, 2-3 times per day.  Take over-the-counter and prescription medicines only as told by your health care provider.  Do not use tobacco products, including cigarettes, chewing tobacco, and e-cigarettes. If you need help quitting, ask your health care provider.  Keep all follow-up visits as told by your health care provider. This is important. SEEK MEDICAL CARE IF:  You have a fever.  Your chest pain becomes worse.  You have new symptoms. SEEK IMMEDIATE MEDICAL CARE IF:  You have nausea or vomiting.  You feel sweaty or light-headed.  You have a cough with phlegm (sputum) or you cough up blood.  You develop shortness  of breath.   This information is not intended to replace advice given to you by your health care provider. Make sure you discuss any questions you have with your health care provider.   Document Released: 01/08/2005 Document Revised: 09/29/2014 Document Reviewed: 04/05/2014 Elsevier Interactive Patient Education 2016 [employer].  Hypertension Hypertension is another name for high blood pressure. High blood pressure forces your heart to work harder to pump blood. A blood pressure reading has two numbers, which includes a higher number over a lower number (example: 110/72). HOME CARE   Have your blood pressure rechecked by your doctor.  Only take medicine as told by your doctor. Follow the directions carefully. The medicine does not work as well if you skip doses. Skipping doses also puts you at risk for problems.  Do not smoke.  Monitor your blood pressure at home as told by your doctor. GET HELP IF:  You think you are having a reaction to the medicine you are taking.  You have repeat headaches or feel dizzy.  You have puffiness (swelling) in your ankles.  You have trouble with your vision. GET HELP RIGHT AWAY IF:   You get a very bad headache and are confused.  You feel weak, numb, or faint.  You get chest or belly (abdominal) pain.  You throw up (vomit).  You cannot breathe very well. MAKE SURE YOU:   Understand these instructions.  Will watch your condition.  Will get  help right away if you are not doing well or get worse.   This information is not intended to replace advice given to you by your health care provider. Make sure you discuss any questions you have with your health care provider.   Document Released: 06/27/2007 Document Revised: 01/13/2013 Document Reviewed: 10/31/2012 Elsevier Interactive Patient Education [employer]

## 2015-03-28 ENCOUNTER — Encounter (HOSPITAL_COMMUNITY): Payer: Self-pay | Admitting: *Deleted

## 2015-03-28 ENCOUNTER — Emergency Department (HOSPITAL_COMMUNITY)
Admission: EM | Admit: 2015-03-28 | Discharge: 2015-03-28 | Disposition: A | Discharge status: Home / Self Care | Payer: Medicare Other | Attending: Physician Assistant | Admitting: Physician Assistant

## 2015-03-28 ENCOUNTER — Emergency Department (HOSPITAL_COMMUNITY): Payer: Medicare Other

## 2015-03-28 DIAGNOSIS — K219 Gastro-esophageal reflux disease without esophagitis: Secondary | ICD-10-CM | POA: Insufficient documentation

## 2015-03-28 DIAGNOSIS — Z8673 Personal history of transient ischemic attack (TIA), and cerebral infarction without residual deficits: Secondary | ICD-10-CM | POA: Diagnosis not present

## 2015-03-28 DIAGNOSIS — Z7984 Long term (current) use of oral hypoglycemic drugs: Secondary | ICD-10-CM | POA: Insufficient documentation

## 2015-03-28 DIAGNOSIS — R0789 Other chest pain: Secondary | ICD-10-CM

## 2015-03-28 DIAGNOSIS — Z7902 Long term (current) use of antithrombotics/antiplatelets: Secondary | ICD-10-CM | POA: Diagnosis not present

## 2015-03-28 DIAGNOSIS — I10 Essential (primary) hypertension: Secondary | ICD-10-CM | POA: Diagnosis not present

## 2015-03-28 DIAGNOSIS — R079 Chest pain, unspecified: Secondary | ICD-10-CM | POA: Diagnosis present

## 2015-03-28 DIAGNOSIS — Z79899 Other long term (current) drug therapy: Secondary | ICD-10-CM | POA: Insufficient documentation

## 2015-03-28 DIAGNOSIS — E119 Type 2 diabetes mellitus without complications: Secondary | ICD-10-CM | POA: Diagnosis not present

## 2015-03-28 LAB — BASIC METABOLIC PANEL
Anion gap: 14 (ref 5–15)
BUN: 8 mg/dL (ref 6–20)
CO2: 26 mmol/L (ref 22–32)
Calcium: 9.3 mg/dL (ref 8.9–10.3)
Chloride: 104 mmol/L (ref 101–111)
Creatinine, Ser: 1 mg/dL (ref 0.44–1.00)
GFR calc Af Amer: >60 mL/min (ref >60)
GFR calc non Af Amer: 57 mL/min — ABNORMAL LOW (ref >60)
Glucose, Bld: 91 mg/dL (ref 65–99)
Potassium: 3.7 mmol/L (ref 3.5–5.1)
Sodium: 144 mmol/L (ref 135–145)

## 2015-03-28 LAB — URINALYSIS, ROUTINE W REFLEX MICROSCOPIC
Bilirubin Urine: NEGATIVE
Glucose, UA: NEGATIVE mg/dL
Hgb urine dipstick: NEGATIVE
Ketones, ur: NEGATIVE mg/dL
Leukocytes, UA: NEGATIVE
Nitrite: NEGATIVE
Protein, ur: NEGATIVE mg/dL
Specific Gravity, Urine: 1.007 (ref 1.005–1.030)
pH: 7.5 (ref 5.0–8.0)

## 2015-03-28 LAB — CBC
HCT: 37.1 % (ref 36.0–46.0)
Hemoglobin: 12.4 g/dL (ref 12.0–15.0)
MCH: 30.4 pg (ref 26.0–34.0)
MCHC: 33.4 g/dL (ref 30.0–36.0)
MCV: 90.9 fL (ref 78.0–100.0)
Platelets: 251 10*3/uL (ref 150–400)
RBC: 4.08 MIL/uL (ref 3.87–5.11)
RDW: 13.7 % (ref 11.5–15.5)
WBC: 9.9 10*3/uL (ref 4.0–10.5)

## 2015-03-28 LAB — I-STAT TROPONIN, ED: Troponin i, poc: 0.03 ng/mL (ref 0.00–0.08)

## 2015-03-28 MED ORDER — METHOCARBAMOL 500 MG PO TABS
500.0000 mg | ORAL_TABLET | Freq: Once | ORAL | Status: AC
  Administered 2015-03-28: 500 mg via ORAL
  Filled 2015-03-28: qty 1

## 2015-03-28 MED ORDER — METHOCARBAMOL 500 MG PO TABS: 500.0000 mg | ORAL_TABLET | Freq: Three times a day (TID) | ORAL | Status: DC | PRN

## 2015-03-28 MED ORDER — ACETAMINOPHEN 325 MG PO TABS
650.0000 mg | ORAL_TABLET | Freq: Once | ORAL | Status: AC
Start: 2015-03-28 — End: 2015-03-28
  Administered 2015-03-28: 650 mg via ORAL
  Filled 2015-03-28: qty 2

## 2015-03-28 NOTE — ED Notes (Signed)
Pt moaning in pain and tearful.  She states that the pain began this am.  Per EPIC pt was seen for CP on 3/2 in ED but pt denies recalling this visit

## 2015-03-28 NOTE — ED Provider Notes (Addendum)
CSN: [id number]     Arrival date & time 03/28/15  1743 History   First MD Initiated Contact with Patient 03/28/15 1757     Chief Complaint  Patient presents with  . Chest Pain     (Consider location/radiation/quality/duration/timing/severity/associated sxs/prior Treatment) HPI   Patient is a 70 year old female presenting with multiple complaints. She reports that she had a "mild stroke" last year and since then she's had mild chest pain and headaches. She reports she's had mild chest pain and headaches today. She also completed of a sore throat and feeling more tired than usual.  Chest pain is worse to palpation. Nonexertional. No masses she with diaphoresis or radiating to either jaw or shoulder.  Past Medical History  Diagnosis Date  . Diabetes mellitus without complication (HCC)   . Hypertension   . Stroke (HCC)   . GERD 09/04/2006    Qualifier: Diagnosis of  By: Duke SalviaGore, Denise    . History of echocardiogram     a. 2D ECHO: 11/06/2013 EF 65%; no WMA. Mild TR. PA pk pressure 43 mm HG   Past Surgical History  Procedure Laterality Date  . Abdominal hysterectomy    . Left heart catheterization with coronary angiogram N/A 11/05/2013    Procedure: LEFT HEART CATHETERIZATION WITH CORONARY ANGIOGRAM;  Surgeon: Lennette Biharihomas A Kelly, MD;  [facility];  Service: Cardiovascular;  Laterality: N/A;   No family history on file. Social History  Substance Use Topics  . Smoking status: Never Smoker   . Smokeless tobacco: None  . Alcohol Use: No   OB History    No data available     Review of Systems    Allergies  Codeine; Ibuprofen; and Imdur  Home Medications   Prior to Admission medications   Medication Sig Start Date End Date Taking? Authorizing Provider  amLODipine (NORVASC) 5 MG tablet Take 1 tablet (5 mg total) by mouth daily. 11/06/13  Yes Janetta HoraKathryn R Thompson, PA-C  atorvastatin (LIPITOR) 10 MG tablet Take 1 tablet (10 mg total) by mouth daily at 6 PM. 06/27/12  Yes Mcarthur Rossettianiel  J Angiulli, PA-C  cloNIDine (CATAPRES) 0.2 MG tablet Take 0.2 mg by mouth 2 (two) times daily.   Yes Historical Provider, MD  clopidogrel (PLAVIX) 75 MG tablet Take 75 mg by mouth daily.   Yes Historical Provider, MD  cyclobenzaprine (FLEXERIL) 5 MG tablet Take 1 tablet (5 mg total) by mouth 3 (three) times daily as needed (muscle soreness). 03/02/15  Yes Devoria AlbeIva Knapp, MD  diphenhydramine-acetaminophen (TYLENOL PM) 25-500 MG TABS tablet Take 1 tablet by mouth at bedtime as needed (sleep).   Yes Historical Provider, MD  ezetimibe (ZETIA) 10 MG tablet Take 10 mg by mouth daily.   Yes Historical Provider, MD  glipiZIDE-metformin (METAGLIP) 5-500 MG tablet Take 1 tablet by mouth at bedtime.   Yes Historical Provider, MD  hydrALAZINE (APRESOLINE) 50 MG tablet Take 50 mg by mouth 2 (two) times daily.   Yes Historical Provider, MD  nitroGLYCERIN (NITROSTAT) 0.4 MG SL tablet Place 1 tablet (0.4 mg total) under the tongue every 5 (five) minutes x 3 doses as needed for chest pain. 11/06/13  Yes Janetta HoraKathryn R Thompson, PA-C  omeprazole (PRILOSEC) 20 MG capsule Take 20 mg by mouth daily.   Yes Historical Provider, MD  traZODone (DESYREL) 50 MG tablet Take 50 mg by mouth at bedtime.   Yes Historical Provider, MD  methocarbamol (ROBAXIN) 500 MG tablet Take 1-2 tablets (500-1,000 mg total) by mouth every 8 (eight)  hours as needed for muscle spasms (and pain). 03/24/15   Trixie Dredge, PA-C  methocarbamol (ROBAXIN) 500 MG tablet Take 1 tablet (500 mg total) by mouth every 8 (eight) hours as needed for muscle spasms. 03/28/15   Eliav Mechling Lyn Ry Moody, MD   BP 181/86 mmHg  Pulse 89  Temp(Src) 98.2 F (36.8 C) (Oral)  Resp 25  SpO2 99% Physical Exam  ED Course  Procedures (including critical care time) Labs Review Labs Reviewed  BASIC METABOLIC PANEL - Abnormal; Notable for the following:    GFR calc non Af Amer 57 (*)    All other components within normal limits  CBC  URINALYSIS, ROUTINE W REFLEX MICROSCOPIC (NOT AT  [facility])  Rosezena Sensor, ED    Imaging Review Dg Chest 2 View  03/28/2015  CLINICAL DATA:  Chest pain EXAM: CHEST  2 VIEW COMPARISON:  03/24/2015 FINDINGS: The heart size and mediastinal contours are within normal limits. Both lungs are clear. The visualized skeletal structures are unremarkable. IMPRESSION: No active cardiopulmonary disease. Electronically Signed   By: Marlan Palau M.D.   On: 03/28/2015 18:45   I have personally reviewed and evaluated these images and lab results as part of my medical decision-making.   EKG Interpretation   Date/Time:  Monday March 28 2015 17:53:54 EST Ventricular Rate:  91 PR Interval:  122 QRS Duration: 61 QT Interval:  439 QTC Calculation: 540 R Axis:   68 Text Interpretation:  Sinus rhythm Borderline repolarization abnormality  Prolonged QT interval Baseline wander in lead(s) V2 No significant change  since last tracing Confirmed by Kandis Mannan ([id number]) on 03/28/2015  5:58:34 PM      MDM   Final diagnoses:  Atypical chest pain    She is a 70 year old female presenting with left-sided body pain and chest pain since her "mild stroke" from last year. Patient seen in emergency department several times for this in the past. Doubt ACS given the lack of corresponding symptoms. Patient also had chest pain for so long that it would be atypical to be ischemic in nature. Her troponin here is negative EKG is nonischemic. Patient's chest x-ray is normal. Doubt PE or other source of chest pain requiring intervention at this time. Patient's resting very comfortably and has normal vital signs as well as labs.  We will again encourage patient to follow up with primary care physician.  Meshia Rau Randall An, MD 03/28/15 2054  9:24 PM Patient still has normal vital signs. Troponin negative. Patient's had no other change in chest pain. Patient requesting the small beige pills that we gave her last time. I think that that she is referring to Robaxin.  We'll give her a short prescription Robaxin and have her follow-up with her primary care physician.  Brande Uncapher Randall An, MD 03/28/15 2124

## 2015-03-28 NOTE — Discharge Instructions (Signed)
Please return with increasing pain. Follow-up with her primary care physician.   Chest Wall Pain Chest wall pain is pain in or around the bones and muscles of your chest. Sometimes, an injury causes this pain. Sometimes, the cause may not be known. This pain may take several weeks or longer to get better. HOME CARE INSTRUCTIONS  Pay attention to any changes in your symptoms. Take these actions to help with your pain:   Rest as told by your health care provider.   Avoid activities that cause pain. These include any activities that use your chest muscles or your abdominal and side muscles to lift heavy items.   If directed, apply ice to the painful area:  Put ice in a plastic bag.  Place a towel between your skin and the bag.  Leave the ice on for 20 minutes, 2-3 times per day.  Take over-the-counter and prescription medicines only as told by your health care provider.  Do not use tobacco products, including cigarettes, chewing tobacco, and e-cigarettes. If you need help quitting, ask your health care provider.  Keep all follow-up visits as told by your health care provider. This is important. SEEK MEDICAL CARE IF:  You have a fever.  Your chest pain becomes worse.  You have new symptoms. SEEK IMMEDIATE MEDICAL CARE IF:  You have nausea or vomiting.  You feel sweaty or light-headed.  You have a cough with phlegm (sputum) or you cough up blood.  You develop shortness of breath.   This information is not intended to replace advice given to you by your health care provider. Make sure you discuss any questions you have with your health care provider.   Document Released: 01/08/2005 Document Revised: 09/29/2014 Document Reviewed: 04/05/2014 Elsevier Interactive Patient Education [employer]

## 2015-04-22 ENCOUNTER — Encounter (HOSPITAL_COMMUNITY): Payer: Self-pay | Admitting: *Deleted

## 2015-04-22 ENCOUNTER — Emergency Department (HOSPITAL_COMMUNITY)
Admission: EM | Admit: 2015-04-22 | Discharge: 2015-04-22 | Disposition: A | Discharge status: Home / Self Care | Payer: Medicare Other | Attending: Emergency Medicine | Admitting: Emergency Medicine

## 2015-04-22 DIAGNOSIS — Z8673 Personal history of transient ischemic attack (TIA), and cerebral infarction without residual deficits: Secondary | ICD-10-CM | POA: Insufficient documentation

## 2015-04-22 DIAGNOSIS — K219 Gastro-esophageal reflux disease without esophagitis: Secondary | ICD-10-CM | POA: Insufficient documentation

## 2015-04-22 DIAGNOSIS — Z79899 Other long term (current) drug therapy: Secondary | ICD-10-CM | POA: Insufficient documentation

## 2015-04-22 DIAGNOSIS — R002 Palpitations: Secondary | ICD-10-CM

## 2015-04-22 DIAGNOSIS — E119 Type 2 diabetes mellitus without complications: Secondary | ICD-10-CM | POA: Insufficient documentation

## 2015-04-22 DIAGNOSIS — Z7984 Long term (current) use of oral hypoglycemic drugs: Secondary | ICD-10-CM | POA: Insufficient documentation

## 2015-04-22 DIAGNOSIS — I10 Essential (primary) hypertension: Secondary | ICD-10-CM | POA: Diagnosis not present

## 2015-04-22 DIAGNOSIS — Z9889 Other specified postprocedural states: Secondary | ICD-10-CM | POA: Diagnosis not present

## 2015-04-22 DIAGNOSIS — Z7902 Long term (current) use of antithrombotics/antiplatelets: Secondary | ICD-10-CM | POA: Diagnosis not present

## 2015-04-22 LAB — COMPREHENSIVE METABOLIC PANEL
ALT: 17 U/L (ref 14–54)
AST: 26 U/L (ref 15–41)
Albumin: 3.9 g/dL (ref 3.5–5.0)
Alkaline Phosphatase: 109 U/L (ref 38–126)
Anion gap: 13 (ref 5–15)
BUN: 9 mg/dL (ref 6–20)
CO2: 25 mmol/L (ref 22–32)
Calcium: 9.2 mg/dL (ref 8.9–10.3)
Chloride: 103 mmol/L (ref 101–111)
Creatinine, Ser: 0.97 mg/dL (ref 0.44–1.00)
GFR calc Af Amer: >60 mL/min (ref >60)
GFR calc non Af Amer: 59 mL/min — ABNORMAL LOW (ref >60)
Glucose, Bld: 119 mg/dL — ABNORMAL HIGH (ref 65–99)
Potassium: 3.6 mmol/L (ref 3.5–5.1)
Sodium: 141 mmol/L (ref 135–145)
Total Bilirubin: 0.5 mg/dL (ref 0.3–1.2)
Total Protein: 8.5 g/dL — ABNORMAL HIGH (ref 6.5–8.1)

## 2015-04-22 LAB — CBC
HCT: 38.2 % (ref 36.0–46.0)
Hemoglobin: 12.3 g/dL (ref 12.0–15.0)
MCH: 29.4 pg (ref 26.0–34.0)
MCHC: 32.2 g/dL (ref 30.0–36.0)
MCV: 91.2 fL (ref 78.0–100.0)
Platelets: 262 10*3/uL (ref 150–400)
RBC: 4.19 MIL/uL (ref 3.87–5.11)
RDW: 13.6 % (ref 11.5–15.5)
WBC: 9 10*3/uL (ref 4.0–10.5)

## 2015-04-22 LAB — CBG MONITORING, ED: Glucose-Capillary: 107 mg/dL — ABNORMAL HIGH (ref 65–99)

## 2015-04-22 LAB — TROPONIN I: Troponin I: <0.03 ng/mL (ref <0.031)

## 2015-04-22 MED ORDER — CLONIDINE HCL 0.1 MG PO TABS
0.1000 mg | ORAL_TABLET | Freq: Three times a day (TID) | ORAL | Status: DC | PRN
  Administered 2015-04-22: 0.1 mg via ORAL
  Filled 2015-04-22: qty 1

## 2015-04-22 MED ORDER — HYDRALAZINE HCL 25 MG PO TABS
25.0000 mg | ORAL_TABLET | Freq: Once | ORAL | Status: AC
  Administered 2015-04-22: 25 mg via ORAL
  Filled 2015-04-22: qty 1

## 2015-04-22 NOTE — ED Notes (Signed)
Pt reports 10/10 pain however pt is laughing and smiling. Pt able to transfer to wheelchair independently and requested to be wheeled to the snack machine.

## 2015-04-22 NOTE — ED Notes (Addendum)
Pt states difficulty sleeping last night d/t her heart racing.  HR 116 in triage.  Pt appears quite distraught about her heart rate, about people upsetting her, etc.  Denies chest pain.  C/o weakness.

## 2015-04-22 NOTE — ED Provider Notes (Signed)
CSN: [id number]     Arrival date & time 04/22/15  1445 History   First MD Initiated Contact with Patient 04/22/15 1708     Chief Complaint  Patient presents with  . Palpitations     (Consider location/radiation/quality/duration/timing/severity/associated sxs/prior Treatment) HPI  The patient is a 70 year old female, she does have a history of a left heart catheterization in 2015, she does have a history of diabetes and stroke and hypertension. She had an echocardiogram in 2015 with an ejection fraction of 65%.  The left heart catheterization in 2015 showed normal coronary arteries and a normal ejection fraction. The patient reports that she gets palpitations that are occurring occasionally, they occur every day, they occur at random times during the day, she reports that she is taking her medications but thinks that it is her medications that are making her heart race. She denies any chest pain or shortness of breath at this time. She does have chest pain with the palpitations. She does report that she gets very anxious.  Past Medical History  Diagnosis Date  . Diabetes mellitus without complication (HCC)   . Hypertension   . Stroke (HCC)   . GERD 09/04/2006    Qualifier: Diagnosis of  By: Duke Salvia    . History of echocardiogram     a. 2D ECHO: 11/06/2013 EF 65%; no WMA. Mild TR. PA pk pressure 43 mm HG   Past Surgical History  Procedure Laterality Date  . Abdominal hysterectomy    . Left heart catheterization with coronary angiogram N/A 11/05/2013    Procedure: LEFT HEART CATHETERIZATION WITH CORONARY ANGIOGRAM;  Surgeon: Lennette Bihari, MD;  Location: [facility] CATH LAB;  Service: Cardiovascular;  Laterality: N/A;   No family history on file. Social History  Substance Use Topics  . Smoking status: Never Smoker   . Smokeless tobacco: None  . Alcohol Use: No   OB History    No data available     Review of Systems  All other systems reviewed and are negative.     Allergies   Codeine; Ibuprofen; and Imdur  Home Medications   Prior to Admission medications   Medication Sig Start Date End Date Taking? Authorizing Provider  amLODipine (NORVASC) 5 MG tablet Take 1 tablet (5 mg total) by mouth daily. 11/06/13  Yes Janetta Hora, PA-C  atorvastatin (LIPITOR) 10 MG tablet Take 1 tablet (10 mg total) by mouth daily at 6 PM. 06/27/12  Yes Mcarthur Rossetti Angiulli, PA-C  cloNIDine (CATAPRES) 0.2 MG tablet Take 0.2 mg by mouth 2 (two) times daily.   Yes Historical Provider, MD  clopidogrel (PLAVIX) 75 MG tablet Take 75 mg by mouth daily.   Yes Historical Provider, MD  diphenhydramine-acetaminophen (TYLENOL PM) 25-500 MG TABS tablet Take 1 tablet by mouth at bedtime as needed (sleep).   Yes Historical Provider, MD  ezetimibe (ZETIA) 10 MG tablet Take 10 mg by mouth daily.   Yes Historical Provider, MD  glipiZIDE-metformin (METAGLIP) 5-500 MG tablet Take 1 tablet by mouth at bedtime.   Yes Historical Provider, MD  hydrALAZINE (APRESOLINE) 50 MG tablet Take 50 mg by mouth 2 (two) times daily.   Yes Historical Provider, MD  methocarbamol (ROBAXIN) 500 MG tablet Take 1-2 tablets (500-1,000 mg total) by mouth every 8 (eight) hours as needed for muscle spasms (and pain). Patient taking differently: Take 250 mg by mouth every 8 (eight) hours as needed for muscle spasms (and pain).  03/24/15  Yes Trixie Dredge, PA-C  nitroGLYCERIN (  NITROSTAT) 0.4 MG SL tablet Place 1 tablet (0.4 mg total) under the tongue every 5 (five) minutes x 3 doses as needed for chest pain. 11/06/13  Yes Janetta Hora, PA-C  cyclobenzaprine (FLEXERIL) 5 MG tablet Take 1 tablet (5 mg total) by mouth 3 (three) times daily as needed (muscle soreness). 03/02/15   Devoria Albe, MD  gabapentin (NEURONTIN) 300 MG capsule Take 300 mg by mouth every evening. Reported on 04/22/2015 03/29/15   Historical Provider, MD  methocarbamol (ROBAXIN) 500 MG tablet Take 1 tablet (500 mg total) by mouth every 8 (eight) hours as needed for muscle  spasms. 03/28/15   Courteney Lyn Mackuen, MD  omeprazole (PRILOSEC) 20 MG capsule Take 20 mg by mouth daily. Reported on 04/22/2015    Historical Provider, MD  traZODone (DESYREL) 50 MG tablet Take 50 mg by mouth at bedtime. Reported on 04/22/2015    Historical Provider, MD   BP 179/64 mmHg  Pulse 87  Temp(Src) 99 F (37.2 C) (Oral)  Resp 26  Ht  (1.676 m)  Wt 160 lb (72.576 kg)  BMI 25.84 kg/m2  SpO2 97% Physical Exam  Constitutional: She appears well-developed and well-nourished. No distress.  HENT:  Head: Normocephalic and atraumatic.  Mouth/Throat: Oropharynx is clear and moist. No oropharyngeal exudate.  Eyes: Conjunctivae and EOM are normal. Pupils are equal, round, and reactive to light. Right eye exhibits no discharge. Left eye exhibits no discharge. No scleral icterus.  Neck: Normal range of motion. Neck supple. No JVD present. No thyromegaly present.  Cardiovascular: Normal rate, regular rhythm, normal heart sounds and intact distal pulses.  Exam reveals no gallop and no friction rub.   No murmur heard. Pulmonary/Chest: Effort normal and breath sounds normal. No respiratory distress. She has no wheezes. She has no rales.  Abdominal: Soft. Bowel sounds are normal. She exhibits no distension and no mass. There is no tenderness.  Musculoskeletal: Normal range of motion. She exhibits no edema or tenderness.  Lymphadenopathy:    She has no cervical adenopathy.  Neurological: She is alert. Coordination normal.  Skin: Skin is warm and dry. No rash noted. No erythema.  Psychiatric: She has a normal mood and affect. Her behavior is normal.  Nursing note and vitals reviewed.   ED Course  Procedures (including critical care time) Labs Review Labs Reviewed  COMPREHENSIVE METABOLIC PANEL - Abnormal; Notable for the following:    Glucose, Bld 119 (*)    Total Protein 8.5 (*)    GFR calc non Af Amer 59 (*)    All other components within normal limits  CBG MONITORING, ED -  Abnormal; Notable for the following:    Glucose-Capillary 107 (*)    All other components within normal limits  TROPONIN I  CBC    Imaging Review No results found. I have personally reviewed and evaluated these images and lab results as part of my medical decision-making.   EKG Interpretation   Date/Time:  Friday April 22 2015 14:50:08 EDT Ventricular Rate:  105 PR Interval:  116 QRS Duration: 64 QT Interval:  310 QTC Calculation: 409 R Axis:   68 Text Interpretation:  Sinus tachycardia Nonspecific ST and T wave  abnormality Abnormal ECG since last tracing no significant change  Confirmed by Kimmerly Lora  MD, Kamya Watling ([id number]) on 04/22/2015 5:28:08 PM      MDM   Final diagnoses:  Palpitations  Essential hypertension    The patient's blood pressure is elevated, I will have her take her nighttime  medications, her heart rate is 80, her blood work was totally unremarkable, the patient was informed of these findings and that she likely needs a Holter monitor test but does not need any further cardiac workup at this time.  Pt has had no arrhythmia here and has normal w/u - stable for d/c.  Pt given instructions verbal and written and is agreeable.    Eber HongBrian Sarajane Fambrough, MD 04/23/15 [phone]

## 2015-04-22 NOTE — Discharge Instructions (Signed)
You will likely need Holter monitor testing to see if your heart is causing palpitations  Take all of your blood pressure medications exactly as prescribed  Your testing here does not show any significant abnormalities

## 2015-05-24 ENCOUNTER — Encounter (HOSPITAL_COMMUNITY): Payer: Self-pay | Admitting: Emergency Medicine

## 2015-05-24 ENCOUNTER — Other Ambulatory Visit: Payer: Self-pay

## 2015-05-24 DIAGNOSIS — F419 Anxiety disorder, unspecified: Secondary | ICD-10-CM | POA: Insufficient documentation

## 2015-05-24 DIAGNOSIS — R200 Anesthesia of skin: Secondary | ICD-10-CM | POA: Insufficient documentation

## 2015-05-24 DIAGNOSIS — M79602 Pain in left arm: Secondary | ICD-10-CM | POA: Insufficient documentation

## 2015-05-24 DIAGNOSIS — R002 Palpitations: Secondary | ICD-10-CM | POA: Insufficient documentation

## 2015-05-24 DIAGNOSIS — Z7902 Long term (current) use of antithrombotics/antiplatelets: Secondary | ICD-10-CM | POA: Diagnosis not present

## 2015-05-24 DIAGNOSIS — Z79899 Other long term (current) drug therapy: Secondary | ICD-10-CM | POA: Insufficient documentation

## 2015-05-24 DIAGNOSIS — R0602 Shortness of breath: Secondary | ICD-10-CM | POA: Insufficient documentation

## 2015-05-24 DIAGNOSIS — Z7984 Long term (current) use of oral hypoglycemic drugs: Secondary | ICD-10-CM | POA: Diagnosis not present

## 2015-05-24 DIAGNOSIS — K219 Gastro-esophageal reflux disease without esophagitis: Secondary | ICD-10-CM | POA: Diagnosis not present

## 2015-05-24 DIAGNOSIS — I10 Essential (primary) hypertension: Secondary | ICD-10-CM | POA: Insufficient documentation

## 2015-05-24 DIAGNOSIS — Z9889 Other specified postprocedural states: Secondary | ICD-10-CM | POA: Insufficient documentation

## 2015-05-24 DIAGNOSIS — Z8673 Personal history of transient ischemic attack (TIA), and cerebral infarction without residual deficits: Secondary | ICD-10-CM | POA: Diagnosis not present

## 2015-05-24 DIAGNOSIS — E119 Type 2 diabetes mellitus without complications: Secondary | ICD-10-CM | POA: Diagnosis not present

## 2015-05-24 DIAGNOSIS — R079 Chest pain, unspecified: Secondary | ICD-10-CM | POA: Insufficient documentation

## 2015-05-24 NOTE — ED Notes (Signed)
Pt. reports left chest pain with palpitations and mild SOB onset this week , denies nausea or diaphoresis .

## 2015-05-25 ENCOUNTER — Emergency Department (HOSPITAL_COMMUNITY)
Admission: EM | Admit: 2015-05-25 | Discharge: 2015-05-25 | Disposition: A | Discharge status: Home / Self Care | Payer: Medicare Other | Attending: Emergency Medicine | Admitting: Emergency Medicine

## 2015-05-25 ENCOUNTER — Emergency Department (HOSPITAL_COMMUNITY): Payer: Medicare Other

## 2015-05-25 DIAGNOSIS — R002 Palpitations: Secondary | ICD-10-CM

## 2015-05-25 LAB — I-STAT TROPONIN, ED: Troponin i, poc: 0 ng/mL (ref 0.00–0.08)

## 2015-05-25 LAB — BASIC METABOLIC PANEL
Anion gap: 11 (ref 5–15)
BUN: 7 mg/dL (ref 6–20)
CO2: 24 mmol/L (ref 22–32)
Calcium: 8.8 mg/dL — ABNORMAL LOW (ref 8.9–10.3)
Chloride: 103 mmol/L (ref 101–111)
Creatinine, Ser: 0.98 mg/dL (ref 0.44–1.00)
GFR calc Af Amer: >60 mL/min (ref >60)
GFR calc non Af Amer: 58 mL/min — ABNORMAL LOW (ref >60)
Glucose, Bld: 210 mg/dL — ABNORMAL HIGH (ref 65–99)
Potassium: 3.4 mmol/L — ABNORMAL LOW (ref 3.5–5.1)
Sodium: 138 mmol/L (ref 135–145)

## 2015-05-25 LAB — CBC
HCT: 36.5 % (ref 36.0–46.0)
Hemoglobin: 11.8 g/dL — ABNORMAL LOW (ref 12.0–15.0)
MCH: 30 pg (ref 26.0–34.0)
MCHC: 32.3 g/dL (ref 30.0–36.0)
MCV: 92.9 fL (ref 78.0–100.0)
Platelets: 255 10*3/uL (ref 150–400)
RBC: 3.93 MIL/uL (ref 3.87–5.11)
RDW: 12.6 % (ref 11.5–15.5)
WBC: 8.3 10*3/uL (ref 4.0–10.5)

## 2015-05-25 MED ORDER — ACETAMINOPHEN 500 MG PO TABS
1000.0000 mg | ORAL_TABLET | Freq: Once | ORAL | Status: AC
  Administered 2015-05-25: 1000 mg via ORAL
  Filled 2015-05-25: qty 2

## 2015-05-25 NOTE — Discharge Instructions (Signed)
PLEASE SEE YOUR PRIMARY CARE PROVIDER TO HAVE A HOLTER MONITOR WHICH WILL CAPTURE THE WAY YOUR HEART IS BEATING OVER A PERIOD OF TIME. YOU SHOULD ALSO  HAVE THYROID STUDIES.  Palpitations A palpitation is the feeling that your heartbeat is irregular or is faster than normal. It may feel like your heart is fluttering or skipping a beat. Palpitations are usually not a serious problem. However, in some cases, you may need further medical evaluation. CAUSES  Palpitations can be caused by:  Smoking.  Caffeine or other stimulants, such as diet pills or energy drinks.  Alcohol.  Stress and anxiety.  Strenuous physical activity.  Fatigue.  Certain medicines.  Heart disease, especially if you have a history of irregular heart rhythms (arrhythmias), such as atrial fibrillation, atrial flutter, or supraventricular tachycardia.  An improperly working pacemaker or defibrillator. DIAGNOSIS  To find the cause of your palpitations, your health care provider will take your medical history and perform a physical exam. Your health care provider may also have you take a test called an ambulatory electrocardiogram (ECG). An ECG records your heartbeat patterns over a 24-hour period. You may also have other tests, such as:  Transthoracic echocardiogram (TTE). During echocardiography, sound waves are used to evaluate how blood flows through your heart.  Transesophageal echocardiogram (TEE).  Cardiac monitoring. This allows your health care provider to monitor your heart rate and rhythm in real time.  Holter monitor. This is a portable device that records your heartbeat and can help diagnose heart arrhythmias. It allows your health care provider to track your heart activity for several days, if needed.  Stress tests by exercise or by giving medicine that makes the heart beat faster. TREATMENT  Treatment of palpitations depends on the cause of your symptoms and can vary greatly. Most cases of palpitations  do not require any treatment other than time, relaxation, and monitoring your symptoms. Other causes, such as atrial fibrillation, atrial flutter, or supraventricular tachycardia, usually require further treatment. HOME CARE INSTRUCTIONS   Avoid:  Caffeinated coffee, tea, soft drinks, diet pills, and energy drinks.  Chocolate.  Alcohol.  Stop smoking if you smoke.  Reduce your stress and anxiety. Things that can help you relax include:  A method of controlling things in your body, such as your heartbeats, with your mind (biofeedback).  Yoga.  Meditation.  Physical activity such as swimming, jogging, or walking.  Get plenty of rest and sleep. SEEK MEDICAL CARE IF:   You continue to have a fast or irregular heartbeat beyond 24 hours.  Your palpitations occur more often. SEEK IMMEDIATE MEDICAL CARE IF:  You have chest pain or shortness of breath.  You have a severe headache.  You feel dizzy or you faint. MAKE SURE YOU:  Understand these instructions.  Will watch your condition.  Will get help right away if you are not doing well or get worse.   This information is not intended to replace advice given to you by your health care provider. Make sure you discuss any questions you have with your health care provider.   Document Released: 01/06/2000 Document Revised: 01/13/2013 Document Reviewed: 03/09/2011 Elsevier Interactive Patient Education [employer]2016 [employer] Inc[employer]
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
## 2015-12-07 ENCOUNTER — Other Ambulatory Visit: Payer: Self-pay | Admitting: Specialist

## 2015-12-07 DIAGNOSIS — Z1231 Encounter for screening mammogram for malignant neoplasm of breast: Secondary | ICD-10-CM

## 2016-06-01 ENCOUNTER — Emergency Department (HOSPITAL_COMMUNITY)
Admission: EM | Admit: 2016-06-01 | Discharge: 2016-06-01 | Disposition: A | Discharge status: Home / Self Care | Payer: Medicare Other | Attending: Emergency Medicine | Admitting: Emergency Medicine

## 2016-06-01 ENCOUNTER — Encounter (HOSPITAL_COMMUNITY): Payer: Self-pay | Admitting: Emergency Medicine

## 2016-06-01 DIAGNOSIS — Z79899 Other long term (current) drug therapy: Secondary | ICD-10-CM | POA: Insufficient documentation

## 2016-06-01 DIAGNOSIS — I10 Essential (primary) hypertension: Secondary | ICD-10-CM | POA: Diagnosis not present

## 2016-06-01 DIAGNOSIS — Z7984 Long term (current) use of oral hypoglycemic drugs: Secondary | ICD-10-CM | POA: Insufficient documentation

## 2016-06-01 DIAGNOSIS — R55 Syncope and collapse: Secondary | ICD-10-CM | POA: Diagnosis present

## 2016-06-01 DIAGNOSIS — Z8673 Personal history of transient ischemic attack (TIA), and cerebral infarction without residual deficits: Secondary | ICD-10-CM | POA: Diagnosis not present

## 2016-06-01 DIAGNOSIS — E860 Dehydration: Secondary | ICD-10-CM | POA: Diagnosis not present

## 2016-06-01 DIAGNOSIS — E119 Type 2 diabetes mellitus without complications: Secondary | ICD-10-CM | POA: Insufficient documentation

## 2016-06-01 DIAGNOSIS — I252 Old myocardial infarction: Secondary | ICD-10-CM | POA: Diagnosis not present

## 2016-06-01 LAB — URINALYSIS, ROUTINE W REFLEX MICROSCOPIC
Bilirubin Urine: NEGATIVE
Glucose, UA: NEGATIVE mg/dL
Hgb urine dipstick: NEGATIVE
Ketones, ur: 20 mg/dL — AB
Nitrite: NEGATIVE
Protein, ur: NEGATIVE mg/dL
Specific Gravity, Urine: 1.018 (ref 1.005–1.030)
pH: 5 (ref 5.0–8.0)

## 2016-06-01 LAB — BASIC METABOLIC PANEL
Anion gap: 9 (ref 5–15)
BUN: 9 mg/dL (ref 6–20)
CO2: 25 mmol/L (ref 22–32)
Calcium: 8.8 mg/dL — ABNORMAL LOW (ref 8.9–10.3)
Chloride: 106 mmol/L (ref 101–111)
Creatinine, Ser: 1.06 mg/dL — ABNORMAL HIGH (ref 0.44–1.00)
GFR calc Af Amer: >60 mL/min (ref >60)
GFR calc non Af Amer: 52 mL/min — ABNORMAL LOW (ref >60)
Glucose, Bld: 104 mg/dL — ABNORMAL HIGH (ref 65–99)
Potassium: 3.4 mmol/L — ABNORMAL LOW (ref 3.5–5.1)
Sodium: 140 mmol/L (ref 135–145)

## 2016-06-01 LAB — CBC WITH DIFFERENTIAL/PLATELET
Basophils Absolute: 0 10*3/uL (ref 0.0–0.1)
Basophils Relative: 0 %
Eosinophils Absolute: 0 10*3/uL (ref 0.0–0.7)
Eosinophils Relative: 0 %
HCT: 38.5 % (ref 36.0–46.0)
Hemoglobin: 12.6 g/dL (ref 12.0–15.0)
Lymphocytes Relative: 10 %
Lymphs Abs: 0.9 10*3/uL (ref 0.7–4.0)
MCH: 30.1 pg (ref 26.0–34.0)
MCHC: 32.7 g/dL (ref 30.0–36.0)
MCV: 91.9 fL (ref 78.0–100.0)
Monocytes Absolute: 0.4 10*3/uL (ref 0.1–1.0)
Monocytes Relative: 5 %
Neutro Abs: 8 10*3/uL — ABNORMAL HIGH (ref 1.7–7.7)
Neutrophils Relative %: 85 %
Platelets: 235 10*3/uL (ref 150–400)
RBC: 4.19 MIL/uL (ref 3.87–5.11)
RDW: 12.7 % (ref 11.5–15.5)
WBC: 9.4 10*3/uL (ref 4.0–10.5)

## 2016-06-01 LAB — CBG MONITORING, ED: Glucose-Capillary: 109 mg/dL — ABNORMAL HIGH (ref 65–99)

## 2016-06-01 LAB — TROPONIN I: Troponin I: <0.03 ng/mL (ref <0.03)

## 2016-06-01 MED ORDER — ONDANSETRON 4 MG PO TBDP
4.0000 mg | ORAL_TABLET | Freq: Once | ORAL | Status: AC
  Administered 2016-06-01: 4 mg via ORAL
  Filled 2016-06-01: qty 1

## 2016-06-01 MED ORDER — SODIUM CHLORIDE 0.9 % IV BOLUS (SEPSIS)
1000.0000 mL | Freq: Once | INTRAVENOUS | Status: AC
  Administered 2016-06-01: 1000 mL via INTRAVENOUS

## 2016-06-01 NOTE — ED Notes (Signed)
Pt ambulated self efficiently to and from restroom with no difficulty. Pt is asking for medicine to make her feel better (dizziness).

## 2016-06-01 NOTE — ED Notes (Signed)
Spoke to main lab, will add troponin

## 2016-06-01 NOTE — ED Notes (Signed)
Patient requesting gas relief and food.  Will speak to MD

## 2016-06-01 NOTE — ED Triage Notes (Signed)
Patient presents today from home with complaints of Near Syncope. Patient reports she was walking 5 miles and became to see the room get dark. Patient reports she is a diabetic and only had jello today. EMS reports CBG 76. Patient alert and oriented on arrival. EMS reports patient orthostatic on arrival . Patient BP lying 128 with standing per EMS patient 98/60. Patient given 500cc of NS. Patient denies any Chest pain, SOB, N/V on arrival. Patient denies any dizziness on arrival.

## 2016-06-01 NOTE — ED Notes (Signed)
Pt c/o dizziness with standing.  Pt transported to RR with wheelchair and RN remained with patient in RR.  Patient able to toilet independently.

## 2016-06-01 NOTE — ED Provider Notes (Signed)
[facility]-EMERGENCY DEPT Provider Note   CSN: [id number] Arrival date & time: 06/01/16  1426     History   Chief Complaint Chief Complaint  Patient presents with  . Near Syncope    HPI [name] is a 71 y.o. female.  71yo F w/ PMH including T2DM, CVA, HTN, NSTEMI who p/w near syncope. The patient walked to the grocery store from her house today, approximately 1 mile and after she got to the grocery store and was walking up and down the aisles, she began feeling lightheaded like she was going to pass out and she sat down. She denies any actual loss of consciousness. She feels better now. She denies any fevers, vomiting, or recent illness. She does note that she has not eaten much today, only had Jell-O and fruit this morning for breakfast. She reports chronic intermittent chest pain since her stroke that is not changed recently. No breathing problems.   The history is provided by the patient.  Near Syncope     Past Medical History:  Diagnosis Date  . Diabetes mellitus without complication (HCC)   . GERD 09/04/2006   Qualifier: Diagnosis of  By: Duke Salvia    . History of echocardiogram    a. 2D ECHO: 11/06/2013 EF 65%; no WMA. Mild TR. PA pk pressure 43 mm HG  . Hypertension   . Stroke [facility])     Patient Active Problem List   Diagnosis Date Noted  . NSTEMI (non-ST elevated myocardial infarction) (HCC) 11/05/2013  . Spinal stenosis of cervical region 10/20/2012  . Prolonged QT interval 10/18/2012  . Left sided numbness 10/18/2012  . Palpitations 10/18/2012  . 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 Numbness 06/20/2012  . Diabetes mellitus (HCC) 06/20/2012  . CVA (cerebral infarction) 06/20/2012  . Hypokalemia 06/20/2012  . ARTHRALGIA 01/28/2009  . CHEST PAIN UNSPECIFIED 11/26/2008  . DEPRESSION 09/04/2006  . Essential hypertension 09/04/2006  . GERD 09/04/2006  . MULTIPLE SITES, ARTHRITIS-ACUTE/CHRONIC 09/04/2006  . LOW BACK PAIN, CHRONIC 09/04/2006     Past Surgical History:  Procedure Laterality Date  . ABDOMINAL HYSTERECTOMY    . LEFT HEART CATHETERIZATION WITH CORONARY ANGIOGRAM N/A 11/05/2013   Procedure: LEFT HEART CATHETERIZATION WITH CORONARY ANGIOGRAM;  Surgeon: Lennette Bihari, MD;  Location: [facility] CATH LAB;  Service: Cardiovascular;  Laterality: N/A;    OB History    No data available       Home Medications    Prior to Admission medications   Medication Sig Start Date End Date Taking? Authorizing Provider  amLODipine (NORVASC) 5 MG tablet Take 1 tablet (5 mg total) by mouth daily. 11/06/13   Janetta Hora, PA-C  atorvastatin (LIPITOR) 10 MG tablet Take 1 tablet (10 mg total) by mouth daily at 6 PM. 06/27/12   Angiulli, Mcarthur Rossetti, PA-C  cloNIDine (CATAPRES) 0.2 MG tablet Take 0.2 mg by mouth 2 (two) times daily.    [provider]  clopidogrel (PLAVIX) 75 MG tablet Take 75 mg by mouth daily.    [provider]  cyclobenzaprine (FLEXERIL) 5 MG tablet Take 1 tablet (5 mg total) by mouth 3 (three) times daily as needed (muscle soreness). 03/02/15   Devoria Albe, MD  diphenhydramine-acetaminophen (TYLENOL PM) 25-500 MG TABS tablet Take 1 tablet by mouth at bedtime as needed (sleep).    [provider]  ezetimibe (ZETIA) 10 MG tablet Take 10 mg by mouth daily.    [provider]  gabapentin (NEURONTIN) 300 MG capsule Take 300 mg  by mouth every evening. Reported on 04/22/2015 03/29/15   [provider]  glipiZIDE-metformin (METAGLIP) 5-500 MG tablet Take 1 tablet by mouth at bedtime.    [provider]  hydrALAZINE (APRESOLINE) 50 MG tablet Take 50 mg by mouth 2 (two) times daily.    [provider]  methocarbamol (ROBAXIN) 500 MG tablet Take 1-2 tablets (500-1,000 mg total) by mouth every 8 (eight) hours as needed for muscle spasms (and pain). Patient taking differently: Take 250 mg by mouth every 8 (eight) hours as needed for muscle spasms (and pain).  03/24/15   Trixie DredgeWest,  Emily, PA-C  methocarbamol (ROBAXIN) 500 MG tablet Take 1 tablet (500 mg total) by mouth every 8 (eight) hours as needed for muscle spasms. 03/28/15   Mackuen, Courteney Lyn, MD  nitroGLYCERIN (NITROSTAT) 0.4 MG SL tablet Place 1 tablet (0.4 mg total) under the tongue every 5 (five) minutes x 3 doses as needed for chest pain. 11/06/13   Janetta Horahompson, Kathryn R, PA-C  omeprazole (PRILOSEC) 20 MG capsule Take 20 mg by mouth daily. Reported on 04/22/2015    [provider]  traZODone (DESYREL) 50 MG tablet Take 50 mg by mouth at bedtime. Reported on 04/22/2015    [provider]    Family History No family history on file.  Social History Social History  Substance Use Topics  . Smoking status: Never Smoker  . Smokeless tobacco: Not on file  . Alcohol use No     Allergies   Codeine; Ibuprofen; and Imdur [isosorbide nitrate]   Review of Systems Review of Systems  Cardiovascular: Positive for near-syncope.   All other systems reviewed and are negative except that which was mentioned in HPI  Physical Exam Updated Vital Signs BP (!) 161/68   Pulse 81   Temp 97.8 F (36.6 C) (Oral)   Resp 19   SpO2 98%   Physical Exam  Constitutional: She is oriented to person, place, and time. She appears well-developed and well-nourished. No distress.  Awake, alert  HENT:  Head: Normocephalic and atraumatic.  Eyes: Conjunctivae and EOM are normal. Pupils are equal, round, and reactive to light.  Neck: Neck supple.  Cardiovascular: Normal rate, regular rhythm and normal heart sounds.   No murmur heard. Pulmonary/Chest: Effort normal and breath sounds normal. No respiratory distress. She exhibits tenderness (left anterior).  Abdominal: Soft. Bowel sounds are normal. She exhibits no distension. There is no tenderness.  Musculoskeletal: She exhibits no edema.  Neurological: She is alert and oriented to person, place, and time. She has normal reflexes. She displays normal reflexes. No  cranial nerve deficit. She exhibits normal muscle tone.  Fluent speech, normal finger-to-nose testing, no clonus 5/5 strength and normal sensation x all 4 extremities  Skin: Skin is warm and dry.  Psychiatric: She has a normal mood and affect. Judgment and thought content normal.  Nursing note and vitals reviewed.    ED Treatments / Results  Labs (all labs ordered are listed, but only abnormal results are displayed) Labs Reviewed  CBC WITH DIFFERENTIAL/PLATELET - Abnormal; Notable for the following:       Result Value   Neutro Abs 8.0 (*)    All other components within normal limits  BASIC METABOLIC PANEL - Abnormal; Notable for the following:    Potassium 3.4 (*)    Glucose, Bld 104 (*)    Creatinine, Ser 1.06 (*)    Calcium 8.8 (*)    GFR calc non Af Amer 52 (*)  All other components within normal limits  URINALYSIS, ROUTINE W REFLEX MICROSCOPIC - Abnormal; Notable for the following:    APPearance HAZY (*)    Ketones, ur 20 (*)    Leukocytes, UA TRACE (*)    Bacteria, UA MANY (*)    Squamous Epithelial / LPF 6-30 (*)    All other components within normal limits  CBG MONITORING, ED - Abnormal; Notable for the following:    Glucose-Capillary 109 (*)    All other components within normal limits  URINE CULTURE  TROPONIN I    EKG  EKG Interpretation  Date/Time:  Friday Jun 01 2016 14:42:46 EDT Ventricular Rate:  69 PR Interval:    QRS Duration: 76 QT Interval:  417 QTC Calculation: 447 R Axis:   57 Text Interpretation:  Sinus rhythm Borderline T wave abnormalities No significant change since last tracing Confirmed by Frederick Peers [id number]) on 06/01/2016 4:27:03 PM       Radiology No results found.  Procedures Procedures (including critical care time)  Medications Ordered in ED Medications  sodium chloride 0.9 % bolus 1,000 mL (0 mLs Intravenous Stopped 06/01/16 1657)  ondansetron (ZOFRAN-ODT) disintegrating tablet 4 mg (4 mg Oral Given 06/01/16 1830)      Initial Impression / Assessment and Plan / ED Course  I have reviewed the triage vital signs and the nursing notes.  Pertinent labs  that were available during my care of the patient were reviewed by me and considered in my medical decision making (see chart for details).    Pt w/ near syncopal episode after she walked ~1 mile to grocery store today. EMS reported normal blood glucose but orthostatic on arrival with splinting blood pressure 98/60. Patient had received fluids prior to arrival and on my exam, she had normal blood pressure and denied any symptoms. she was no longer orthostatic after IV fluids. She had mild L anterior chest tenderness which she states reproduced her chronic chest pain, no new symptoms to suggest cardiac pathology. EKG reassuring. Patient given IV fluid bolus and Zofran. She also ate a sandwich, peanut butter, crackers, and juice. Her lab work here shows mild ketones in her urine, otherwise unremarkable CBC and BMP, creatinine borderline normal at 1.06. She has been ambulatory here with no problems and her blood pressure has been normal to slightly elevated here. I suspect possible dehydration and heat exhaustion given that she walked so far with almost nothing to eat today. Because she is well-appearing here with normal neurologic exam and reassuring labs, I feel she is safe for discharge with supportive care. I have emphasized the importance of good hydration and extensively reviewed return precautions. Patient voiced understanding and was discharged in satisfactory condition.   Final Clinical Impressions(s) / ED Diagnoses   Final diagnoses:  Near syncope  Dehydration    New Prescriptions New Prescriptions   No medications on file     Little, Ambrose Finland, MD 06/01/16 [phone]

## 2016-06-01 NOTE — ED Notes (Signed)
Made MD aware of pt's request to be d/c'd due to "need to catch the bus".  MD at bedside updating patient at this time.

## 2016-06-01 NOTE — ED Notes (Signed)
Patient able to ambulate independently  

## 2016-06-03 LAB — URINE CULTURE

## 2016-09-23 ENCOUNTER — Emergency Department (HOSPITAL_COMMUNITY): Payer: Medicare Other

## 2016-09-23 ENCOUNTER — Encounter (HOSPITAL_COMMUNITY): Payer: Self-pay | Admitting: Emergency Medicine

## 2016-09-23 ENCOUNTER — Emergency Department (HOSPITAL_COMMUNITY)
Admission: EM | Admit: 2016-09-23 | Discharge: 2016-09-23 | Disposition: A | Discharge status: Home / Self Care | Payer: Medicare Other | Attending: Emergency Medicine | Admitting: Emergency Medicine

## 2016-09-23 DIAGNOSIS — I252 Old myocardial infarction: Secondary | ICD-10-CM | POA: Insufficient documentation

## 2016-09-23 DIAGNOSIS — Z8673 Personal history of transient ischemic attack (TIA), and cerebral infarction without residual deficits: Secondary | ICD-10-CM | POA: Insufficient documentation

## 2016-09-23 DIAGNOSIS — Z7984 Long term (current) use of oral hypoglycemic drugs: Secondary | ICD-10-CM | POA: Insufficient documentation

## 2016-09-23 DIAGNOSIS — I10 Essential (primary) hypertension: Secondary | ICD-10-CM | POA: Diagnosis not present

## 2016-09-23 DIAGNOSIS — Z79899 Other long term (current) drug therapy: Secondary | ICD-10-CM | POA: Diagnosis not present

## 2016-09-23 DIAGNOSIS — Z7902 Long term (current) use of antithrombotics/antiplatelets: Secondary | ICD-10-CM | POA: Diagnosis not present

## 2016-09-23 DIAGNOSIS — E119 Type 2 diabetes mellitus without complications: Secondary | ICD-10-CM | POA: Insufficient documentation

## 2016-09-23 DIAGNOSIS — R079 Chest pain, unspecified: Secondary | ICD-10-CM | POA: Insufficient documentation

## 2016-09-23 DIAGNOSIS — R002 Palpitations: Secondary | ICD-10-CM

## 2016-09-23 LAB — BASIC METABOLIC PANEL
Anion gap: 12 (ref 5–15)
BUN: 8 mg/dL (ref 6–20)
CO2: 22 mmol/L (ref 22–32)
Calcium: 8.6 mg/dL — ABNORMAL LOW (ref 8.9–10.3)
Chloride: 102 mmol/L (ref 101–111)
Creatinine, Ser: 0.99 mg/dL (ref 0.44–1.00)
GFR calc Af Amer: >60 mL/min (ref >60)
GFR calc non Af Amer: 57 mL/min — ABNORMAL LOW (ref >60)
Glucose, Bld: 170 mg/dL — ABNORMAL HIGH (ref 65–99)
Potassium: 3.2 mmol/L — ABNORMAL LOW (ref 3.5–5.1)
Sodium: 136 mmol/L (ref 135–145)

## 2016-09-23 LAB — I-STAT TROPONIN, ED
Troponin i, poc: 0 ng/mL (ref 0.00–0.08)
Troponin i, poc: 0.01 ng/mL (ref 0.00–0.08)

## 2016-09-23 LAB — CBC
HCT: 37.8 % (ref 36.0–46.0)
Hemoglobin: 12.2 g/dL (ref 12.0–15.0)
MCH: 29.8 pg (ref 26.0–34.0)
MCHC: 32.3 g/dL (ref 30.0–36.0)
MCV: 92.4 fL (ref 78.0–100.0)
Platelets: 236 10*3/uL (ref 150–400)
RBC: 4.09 MIL/uL (ref 3.87–5.11)
RDW: 12.8 % (ref 11.5–15.5)
WBC: 10.3 10*3/uL (ref 4.0–10.5)

## 2016-09-23 MED ORDER — LIDOCAINE VISCOUS 2 % MT SOLN
15.0000 mL | Freq: Once | OROMUCOSAL | Status: AC
  Administered 2016-09-23: 15 mL via OROMUCOSAL
  Filled 2016-09-23: qty 15

## 2016-09-23 NOTE — ED Provider Notes (Signed)
[facility] DEPT Provider Note   CSN: [id number] Arrival date & time: 09/23/16  1543     History   Chief Complaint Chief Complaint  Patient presents with  . Chest Pain    HPI [name] is a 71 y.o. female.  HPI  71 year old female history of type 2 diabetes, hypertension, CVA, GERD presents today complaining of palpitations with associated chest pain. She states this began this afternoon just prior to evaluation. She reports having multiple recurrent episodes of this since she had a stroke in 2016. She states that she has been seen multiple times for this. She is unclear how long it lasted stating that it was greater than 7. She had pounding type pain associated with this. She states she has had similar symptoms multiple times in the past. She denies any headache, dyspnea, nausea, vomiting, lower leg lateralized swelling, edema, history of DVT, or PE. She has presented multiple times in the past 3 years for similar symptoms. In October 2015 she presented with complaints of limitations with increasing shortness of breath. That time she had mildly elevated troponin. She was subsequently taken for cardiac catheterization and had normal left ventricular function and normal coronary arteries  Past Medical History:  Diagnosis Date  . Diabetes mellitus without complication (HCC)   . GERD 09/04/2006   Qualifier: Diagnosis of  By: Duke Salvia    . History of echocardiogram    a. 2D ECHO: 11/06/2013 EF 65%; no WMA. Mild TR. PA pk pressure 43 mm HG  . Hypertension   . Stroke [facility])     Patient Active Problem List   Diagnosis Date Noted  . NSTEMI (non-ST elevated myocardial infarction) (HCC) 11/05/2013  . Spinal stenosis of cervical region 10/20/2012  . Prolonged QT interval 10/18/2012  . Left sided numbness 10/18/2012  . Palpitations 10/18/2012  . 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 Numbness 06/20/2012  . Diabetes mellitus (HCC) 06/20/2012  . CVA (cerebral  infarction) 06/20/2012  . Hypokalemia 06/20/2012  . ARTHRALGIA 01/28/2009  . CHEST PAIN UNSPECIFIED 11/26/2008  . DEPRESSION 09/04/2006  . Essential hypertension 09/04/2006  . GERD 09/04/2006  . MULTIPLE SITES, ARTHRITIS-ACUTE/CHRONIC 09/04/2006  . LOW BACK PAIN, CHRONIC 09/04/2006    Past Surgical History:  Procedure Laterality Date  . ABDOMINAL HYSTERECTOMY    . LEFT HEART CATHETERIZATION WITH CORONARY ANGIOGRAM N/A 11/05/2013   Procedure: LEFT HEART CATHETERIZATION WITH CORONARY ANGIOGRAM;  Surgeon: Lennette Bihari, MD;  Location[facility] CATH LAB;  Service: Cardiovascular;  Laterality: N/A;    OB History    No data available       Home Medications    Prior to Admission medications   Medication Sig Start Date End Date Taking? Authorizing Provider  amLODipine (NORVASC) 5 MG tablet Take 1 tablet (5 mg total) by mouth daily. 11/06/13   Janetta Hora, PA-C  atorvastatin (LIPITOR) 10 MG tablet Take 1 tablet (10 mg total) by mouth daily at 6 PM. 06/27/12   Angiulli, Mcarthur Rossetti, PA-C  cloNIDine (CATAPRES) 0.2 MG tablet Take 0.2 mg by mouth 2 (two) times daily.    [provider]  clopidogrel (PLAVIX) 75 MG tablet Take 75 mg by mouth daily.    [provider]  cyclobenzaprine (FLEXERIL) 5 MG tablet Take 1 tablet (5 mg total) by mouth 3 (three) times daily as needed (muscle soreness). 03/02/15   Devoria Albe, MD  diphenhydramine-acetaminophen (TYLENOL PM) 25-500 MG TABS tablet Take 1 tablet by mouth at bedtime as needed (sleep).  [provider]  ezetimibe (ZETIA) 10 MG tablet Take 10 mg by mouth daily.    [provider]  gabapentin (NEURONTIN) 300 MG capsule Take 300 mg by mouth every evening. Reported on 04/22/2015 03/29/15   [provider]  glipiZIDE-metformin (METAGLIP) 5-500 MG tablet Take 1 tablet by mouth at bedtime.    [provider]  hydrALAZINE (APRESOLINE) 50 MG tablet Take 50 mg by mouth 2 (two) times daily.    [provider]  methocarbamol (ROBAXIN) 500 MG tablet Take 1-2 tablets (500-1,000 mg total) by mouth every 8 (eight) hours as needed for muscle spasms (and pain). Patient taking differently: Take 250 mg by mouth every 8 (eight) hours as needed for muscle spasms (and pain).  03/24/15   Trixie DredgeWest, Emily, PA-C  methocarbamol (ROBAXIN) 500 MG tablet Take 1 tablet (500 mg total) by mouth every 8 (eight) hours as needed for muscle spasms. 03/28/15   Mackuen, Courteney Lyn, MD  nitroGLYCERIN (NITROSTAT) 0.4 MG SL tablet Place 1 tablet (0.4 mg total) under the tongue every 5 (five) minutes x 3 doses as needed for chest pain. 11/06/13   Janetta Horahompson, Kathryn R, PA-C  omeprazole (PRILOSEC) 20 MG capsule Take 20 mg by mouth daily. Reported on 04/22/2015    [provider]  traZODone (DESYREL) 50 MG tablet Take 50 mg by mouth at bedtime. Reported on 04/22/2015    [provider]    Family History No family history on file.  Social History Social History  Substance Use Topics  . Smoking status: Never Smoker  . Smokeless tobacco: Never Used  . Alcohol use No     Allergies   Codeine; Ibuprofen; and Imdur [isosorbide nitrate]   Review of Systems Review of Systems  All other systems reviewed and are negative.    Physical Exam Updated Vital Signs BP (!) 149/58   Pulse 84   Temp 98.5 F (36.9 C) (Oral)   Ht 1.626 m (5\' 4" )   Wt 65.8 kg (145 lb)   SpO2 99%   BMI 24.89 kg/m   Physical Exam  Constitutional: She is oriented to person, place, and time. She appears well-developed and well-nourished. No distress.  HENT:  Head: Normocephalic and atraumatic.  Right Ear: External ear normal.  Left Ear: External ear normal.  Nose: Nose normal.  Eyes: Pupils are equal, round, and reactive to light. Conjunctivae and EOM are normal.  Neck: Normal range of motion. Neck supple.  Cardiovascular: Normal rate, regular rhythm and normal heart sounds.   Pulmonary/Chest: Effort normal.  Abdominal:  Soft. Bowel sounds are normal.  Musculoskeletal: Normal range of motion.  Neurological: She is alert and oriented to person, place, and time. She exhibits normal muscle tone. Coordination normal.  Skin: Skin is warm and dry. Capillary refill takes less than 2 seconds.  Psychiatric: She has a normal mood and affect. Her behavior is normal. Thought content normal.  Nursing note and vitals reviewed.    ED Treatments / Results  Labs (all labs ordered are listed, but only abnormal results are displayed) Labs Reviewed  BASIC METABOLIC PANEL - Abnormal; Notable for the following:       Result Value   Potassium 3.2 (*)    Glucose, Bld 170 (*)    Calcium 8.6 (*)    GFR calc non Af Amer 57 (*)    All other components within normal limits  CBC  I-STAT TROPONIN, ED    EKG  EKG Interpretation  Date/Time:  Sunday September 23 2016 16:02:21 EDT Ventricular Rate:  80 PR Interval:    QRS Duration: 63 QT Interval:  301 QTC Calculation: 348 R Axis:   57 Text Interpretation:  Sinus rhythm Atrial premature complex Non-specific ST-t changes Confirmed by Margarita Grizzle [phone]) on 09/23/2016 4:16:17 PM       Radiology Dg Chest 2 View  Result Date: 09/23/2016 CLINICAL DATA:  Lambert Mody LEFT-sided chest pain EXAM: CHEST  2 VIEW COMPARISON:  Radiograph 05/24/2015 FINDINGS: Normal mediastinum and cardiac silhouette. Normal pulmonary vasculature. No evidence of effusion, infiltrate, or pneumothorax. No acute bony abnormality. IMPRESSION: No acute cardiopulmonary process. Electronically Signed   By: Genevive Bi M.D.   On: 09/23/2016 16:46    Procedures Procedures (including critical care time)  Medications Ordered in ED Medications - No data to display   Initial Impression / Assessment and Plan / ED Course  I have reviewed the triage vital signs and the nursing notes.  Pertinent labs & imaging results that were available during my care of the patient were reviewed by me and considered in my  medical decision making (see chart for details).     71 year old female with palpitations and associated chest pain that she describes as pounding with the palpitations. She is pain free here. EKG has no significant ST changes but not significantly changed from prior. I reviewed prior cardiac catheterization 2015 that was normal. Initial troponin was normal. Will repeat troponin at 7 PM and this is normal will discharge to home.  Final Clinical Impressions(s) / ED Diagnoses   Final diagnoses:  Palpitations  Chest pain, unspecified type    New Prescriptions New Prescriptions   No medications on file     Margarita Grizzle, MD 09/23/16 1925

## 2016-09-23 NOTE — ED Triage Notes (Signed)
Per EMS pt from home sharp L side chest pain intermittent since yesterday, pt felt heart race today, denies pain at present, 324 mg aspirin before EMS arrival, pt in normal sinus denies N/V, some new anxiety.

## 2016-09-23 NOTE — Discharge Instructions (Addendum)
Return if worse at any time.  Recheck with your doctor this week.

## 2017-04-15 ENCOUNTER — Emergency Department (HOSPITAL_COMMUNITY): Payer: Medicare Other

## 2017-04-15 ENCOUNTER — Encounter (HOSPITAL_COMMUNITY): Payer: Self-pay | Admitting: Emergency Medicine

## 2017-04-15 ENCOUNTER — Emergency Department (HOSPITAL_COMMUNITY)
Admission: EM | Admit: 2017-04-15 | Discharge: 2017-04-16 | Disposition: A | Discharge status: Home / Self Care | Payer: Medicare Other | Attending: Emergency Medicine | Admitting: Emergency Medicine

## 2017-04-15 DIAGNOSIS — Z7983 Long term (current) use of bisphosphonates: Secondary | ICD-10-CM | POA: Insufficient documentation

## 2017-04-15 DIAGNOSIS — I10 Essential (primary) hypertension: Secondary | ICD-10-CM | POA: Insufficient documentation

## 2017-04-15 DIAGNOSIS — E119 Type 2 diabetes mellitus without complications: Secondary | ICD-10-CM | POA: Diagnosis not present

## 2017-04-15 DIAGNOSIS — Z7982 Long term (current) use of aspirin: Secondary | ICD-10-CM | POA: Diagnosis not present

## 2017-04-15 DIAGNOSIS — R002 Palpitations: Secondary | ICD-10-CM | POA: Diagnosis not present

## 2017-04-15 DIAGNOSIS — R0789 Other chest pain: Secondary | ICD-10-CM | POA: Diagnosis present

## 2017-04-15 LAB — BASIC METABOLIC PANEL
Anion gap: 13 (ref 5–15)
BUN: 9 mg/dL (ref 6–20)
CO2: 23 mmol/L (ref 22–32)
Calcium: 8.7 mg/dL — ABNORMAL LOW (ref 8.9–10.3)
Chloride: 101 mmol/L (ref 101–111)
Creatinine, Ser: 1.06 mg/dL — ABNORMAL HIGH (ref 0.44–1.00)
GFR calc Af Amer: >60 mL/min (ref >60)
GFR calc non Af Amer: 52 mL/min — ABNORMAL LOW (ref >60)
Glucose, Bld: 195 mg/dL — ABNORMAL HIGH (ref 65–99)
Potassium: 3.5 mmol/L (ref 3.5–5.1)
Sodium: 137 mmol/L (ref 135–145)

## 2017-04-15 LAB — I-STAT TROPONIN, ED: Troponin i, poc: 0 ng/mL (ref 0.00–0.08)

## 2017-04-15 LAB — CBC
HCT: 35.2 % — ABNORMAL LOW (ref 36.0–46.0)
Hemoglobin: 11.6 g/dL — ABNORMAL LOW (ref 12.0–15.0)
MCH: 30.3 pg (ref 26.0–34.0)
MCHC: 33 g/dL (ref 30.0–36.0)
MCV: 91.9 fL (ref 78.0–100.0)
Platelets: 222 10*3/uL (ref 150–400)
RBC: 3.83 MIL/uL — ABNORMAL LOW (ref 3.87–5.11)
RDW: 12.6 % (ref 11.5–15.5)
WBC: 7.5 10*3/uL (ref 4.0–10.5)

## 2017-04-15 MED ORDER — NAPROXEN 250 MG PO TABS
500.0000 mg | ORAL_TABLET | Freq: Once | ORAL | Status: AC
  Administered 2017-04-15: 500 mg via ORAL
  Filled 2017-04-15: qty 2

## 2017-04-15 NOTE — ED Triage Notes (Signed)
Per [facility]: Pt to ED from home c/o non-radiating CP and generalized weakness all day, hx MI. Patient took 324 ASA prior to EMS arrival without relief. Patient denies SOB, N/V. EMS VS: 148/66, HR 88 NSR, RR 20, 99% RA. 20g. LAC. Resp e/u, skin warm/dry.

## 2017-04-15 NOTE — ED Provider Notes (Signed)
[facility]   CSN: [id number] Arrival date & time: 04/15/17  2149     History   Chief Complaint Chief Complaint  Patient presents with  . Chest Pain    HPI [name] is a 72 y.o. female.  HPI  This is a 72 year old female with a history of diabetes, reflux, CVA who presents with chest pain and palpitations.  Patient reports she has intermittent episodes of palpitations and left breast discomfort.  Tonight she was sitting watching TV when she had onset of the symptoms.  She took an aspirin with no relief.  Symptoms do not seem to be worse with exertion, eating, deep breaths.  She denies any recent fevers or cough.  Denies infectious symptoms.  Currently she rates her pain at 10 out of 10.  Denies leg swelling or history of blood clots.  I have reviewed the patient's chart.  She had a cardiac catheterization in 2015 that was clean without any evidence of coronary artery disease.  She has had multiple subsequent ER evaluations for palpitations and atypical chest pain.  Additionally she has had a CT angio 2 years ago that was negative for PE.  Past Medical History:  Diagnosis Date  . Diabetes mellitus without complication (HCC)   . GERD 09/04/2006   Qualifier: Diagnosis of  By: Duke SalviaGore, Denise    . History of echocardiogram    a. 2D ECHO: 11/06/2013 EF 65%; no WMA. Mild TR. PA pk pressure 43 mm HG  . Hypertension   . Stroke [facility](HCC)     Patient Active Problem List   Diagnosis Date Noted  . NSTEMI (non-ST elevated myocardial infarction) (HCC) 11/05/2013  . Spinal stenosis of cervical region 10/20/2012  . Prolonged QT interval 10/18/2012  . Left sided numbness 10/18/2012  . Palpitations 10/18/2012  . 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 Numbness 06/20/2012  . Diabetes mellitus (HCC) 06/20/2012  . CVA (cerebral infarction) 06/20/2012  . Hypokalemia 06/20/2012  . ARTHRALGIA 01/28/2009  . CHEST PAIN UNSPECIFIED  11/26/2008  . DEPRESSION 09/04/2006  . Essential hypertension 09/04/2006  . GERD 09/04/2006  . MULTIPLE SITES, ARTHRITIS-ACUTE/CHRONIC 09/04/2006  . LOW BACK PAIN, CHRONIC 09/04/2006    Past Surgical History:  Procedure Laterality Date  . ABDOMINAL HYSTERECTOMY    . LEFT HEART CATHETERIZATION WITH CORONARY ANGIOGRAM N/A 11/05/2013   Procedure: LEFT HEART CATHETERIZATION WITH CORONARY ANGIOGRAM;  Surgeon: Lennette Biharihomas A Kelly, MD;  [facility];  Service: Cardiovascular;  Laterality: N/A;     OB History   None      Home Medications    Prior to Admission medications   Medication Sig Start Date End Date Taking? Authorizing Provider  amLODipine (NORVASC) 10 MG tablet Take 10 mg by mouth daily.   Yes [provider]  amLODipine (NORVASC) 5 MG tablet Take 1 tablet (5 mg total) by mouth daily. 11/06/13  Yes Janetta Horahompson, Kathryn R, PA-C  aspirin EC 81 MG tablet Take 81 mg by mouth daily.   Yes [provider]  atorvastatin (LIPITOR) 10 MG tablet Take 1 tablet (10 mg total) by mouth daily at 6 PM. 06/27/12  Yes Angiulli, Mcarthur Rossettianiel J, PA-C  cloNIDine (CATAPRES) 0.2 MG tablet Take 0.2 mg by mouth 2 (two) times daily.   Yes [provider]  hydrALAZINE (APRESOLINE) 50 MG tablet Take 50 mg by mouth 2 (two) times daily.   Yes [provider]  losartan (COZAAR) 50 MG tablet Take 50 mg by mouth  daily.   Yes [provider]  nitroGLYCERIN (NITROSTAT) 0.4 MG SL tablet Place 1 tablet (0.4 mg total) under the tongue every 5 (five) minutes x 3 doses as needed for chest pain. 11/06/13  Yes Janetta Hora, PA-C  cyclobenzaprine (FLEXERIL) 5 MG tablet Take 1 tablet (5 mg total) by mouth 3 (three) times daily as needed (muscle soreness). Patient not taking: Reported on 04/15/2017 03/02/15   Devoria Albe, MD  gabapentin (NEURONTIN) 300 MG capsule Take 300 mg by mouth every evening. Reported on 04/22/2015 03/29/15   [provider]  methocarbamol (ROBAXIN) 500  MG tablet Take 1-2 tablets (500-1,000 mg total) by mouth every 8 (eight) hours as needed for muscle spasms (and pain). Patient not taking: Reported on 04/15/2017 03/24/15   Trixie Dredge, PA-C  methocarbamol (ROBAXIN) 500 MG tablet Take 1 tablet (500 mg total) by mouth every 8 (eight) hours as needed for muscle spasms. Patient not taking: Reported on 04/15/2017 03/28/15   Abelino Derrick, MD    Family History No family history on file.  Social History Social History   Tobacco Use  . Smoking status: Never Smoker  . Smokeless tobacco: Never Used  Substance Use Topics  . Alcohol use: No  . Drug use: No     Allergies   Codeine; Ibuprofen; and Imdur [isosorbide nitrate]   Review of Systems Review of Systems  Constitutional: Negative for fever.  Respiratory: Negative for cough and shortness of breath.   Cardiovascular: Positive for chest pain. Negative for leg swelling.  Gastrointestinal: Negative for abdominal pain, nausea and vomiting.  Genitourinary: Negative for dysuria.  Musculoskeletal: Negative for back pain.  All other systems reviewed and are negative.    Physical Exam Updated Vital Signs BP (!) 129/58   Pulse 66   Temp 97.8 F (36.6 C) (Oral)   Resp 19   SpO2 98%   Physical Exam  Constitutional: She is oriented to person, place, and time. She appears well-developed and well-nourished.  Elderly, nontoxic-appearing, no acute distress  HENT:  Head: Normocephalic and atraumatic.  Eyes: Pupils are equal, round, and reactive to light.  Neck: Neck supple.  Cardiovascular: Normal rate, regular rhythm, normal heart sounds and normal pulses.  No murmur heard. Left anterior chest wall tenderness to palpation, no crepitus, no overlying skin changes  Pulmonary/Chest: Effort normal. No respiratory distress. She has no wheezes.  Abdominal: Soft. Bowel sounds are normal.  Musculoskeletal:       Right lower leg: She exhibits no edema.       Left lower leg: She exhibits  no edema.  Neurological: She is alert and oriented to person, place, and time.  Skin: Skin is warm and dry.  Psychiatric: She has a normal mood and affect.  Nursing note and vitals reviewed.    ED Treatments / Results  Labs (all labs ordered are listed, but only abnormal results are displayed) Labs Reviewed  BASIC METABOLIC PANEL - Abnormal; Notable for the following components:      Result Value   Glucose, Bld 195 (*)    Creatinine, Ser 1.06 (*)    Calcium 8.7 (*)    GFR calc non Af Amer 52 (*)    All other components within normal limits  CBC - Abnormal; Notable for the following components:   RBC 3.83 (*)    Hemoglobin 11.6 (*)    HCT 35.2 (*)    All other components within normal limits  I-STAT TROPONIN, ED  I-STAT TROPONIN, ED  EKG EKG Interpretation  Date/Time:  Monday April 15 2017 21:58:26 EDT Ventricular Rate:  78 PR Interval:    QRS Duration: 69 QT Interval:  433 QTC Calculation: 494 R Axis:   70 Text Interpretation:  Sinus rhythm Borderline T abnormalities, inferior leads Borderline prolonged QT interval No significant change since last tracing Confirmed by Ross Marcus ([id number]) on 04/15/2017 11:04:13 PM   Radiology Dg Chest 2 View  Result Date: 04/15/2017 CLINICAL DATA:  Chest pain EXAM: CHEST - 2 VIEW COMPARISON:  09/23/2016 FINDINGS: The heart size and mediastinal contours are within normal limits. Both lungs are clear. The visualized skeletal structures are unremarkable. IMPRESSION: No active cardiopulmonary disease. Electronically Signed   By: Jasmine Pang M.D.   On: 04/15/2017 22:52    Procedures Procedures (including critical care time)  Medications Ordered in ED Medications  naproxen (NAPROSYN) tablet 500 mg (500 mg Oral Given 04/15/17 2327)     Initial Impression / Assessment and Plan / ED Course  I have reviewed the triage vital signs and the nursing notes.  Pertinent labs & imaging results that were available during my care of the  patient were reviewed by me and considered in my medical decision making (see chart for details).     Patient presents with chest pain and palpitations.  Pain is very atypical.  She is nontoxic appearing and vital signs are reassuring.  Heart rate is 66.  EKG shows no signs of arrhythmia or ischemia.  Initial troponin is negative.  Patient has had multiple prior evaluations with similar symptoms.  She had a normal cardiac catheterization in 2015 with no evidence of coronary artery disease.  I feel her pain is very atypical.  She does report palpitations.  She is not tachycardic.  Low suspicion at this time for PE as she is not hypoxic or short of breath.  She may benefit from Holter monitor.  Repeat troponin at 3 hours remains negative.  Recommend cardiology follow-up for possible stress testing and Holter monitoring.  After history, exam, and medical workup I feel the patient has been appropriately medically screened and is safe for discharge home. Pertinent diagnoses were discussed with the patient. Patient was given return precautions.   Final Clinical Impressions(s) / ED Diagnoses   Final diagnoses:  Atypical chest pain  Palpitations    ED Discharge Orders    None       Ryland Tungate, Mayer Masker, MD 04/16/17 [phone]

## 2017-04-15 NOTE — ED Notes (Signed)
Patient transported to X-ray 

## 2017-04-16 LAB — I-STAT TROPONIN, ED: Troponin i, poc: 0 ng/mL (ref 0.00–0.08)

## 2017-04-16 NOTE — Discharge Instructions (Addendum)
You were seen today for chest pain and palpitations.  Your workup is reassuring.  You need to follow-up with cardiology for repeat evaluation.  You may benefit from a Holter monitor.  Avoid excessive caffeine use.

## 2017-04-18 ENCOUNTER — Encounter (HOSPITAL_COMMUNITY): Payer: Self-pay

## 2017-04-18 ENCOUNTER — Other Ambulatory Visit: Payer: Self-pay

## 2017-04-18 ENCOUNTER — Emergency Department (HOSPITAL_COMMUNITY)
Admission: EM | Admit: 2017-04-18 | Discharge: 2017-04-18 | Disposition: A | Discharge status: Home / Self Care | Payer: No Typology Code available for payment source | Attending: Emergency Medicine | Admitting: Emergency Medicine

## 2017-04-18 DIAGNOSIS — Y929 Unspecified place or not applicable: Secondary | ICD-10-CM | POA: Insufficient documentation

## 2017-04-18 DIAGNOSIS — S161XXA Strain of muscle, fascia and tendon at neck level, initial encounter: Secondary | ICD-10-CM

## 2017-04-18 DIAGNOSIS — Z7982 Long term (current) use of aspirin: Secondary | ICD-10-CM | POA: Diagnosis not present

## 2017-04-18 DIAGNOSIS — Y999 Unspecified external cause status: Secondary | ICD-10-CM | POA: Diagnosis not present

## 2017-04-18 DIAGNOSIS — Z79899 Other long term (current) drug therapy: Secondary | ICD-10-CM | POA: Diagnosis not present

## 2017-04-18 DIAGNOSIS — Y939 Activity, unspecified: Secondary | ICD-10-CM | POA: Insufficient documentation

## 2017-04-18 DIAGNOSIS — S199XXA Unspecified injury of neck, initial encounter: Secondary | ICD-10-CM | POA: Diagnosis present

## 2017-04-18 DIAGNOSIS — E119 Type 2 diabetes mellitus without complications: Secondary | ICD-10-CM | POA: Insufficient documentation

## 2017-04-18 DIAGNOSIS — Z8673 Personal history of transient ischemic attack (TIA), and cerebral infarction without residual deficits: Secondary | ICD-10-CM | POA: Insufficient documentation

## 2017-04-18 DIAGNOSIS — I10 Essential (primary) hypertension: Secondary | ICD-10-CM | POA: Insufficient documentation

## 2017-04-18 DIAGNOSIS — M7918 Myalgia, other site: Secondary | ICD-10-CM

## 2017-04-18 MED ORDER — ACETAMINOPHEN 325 MG PO TABS
650.0000 mg | ORAL_TABLET | Freq: Once | ORAL | Status: AC
  Administered 2017-04-18: 650 mg via ORAL
  Filled 2017-04-18: qty 2

## 2017-04-18 MED ORDER — CYCLOBENZAPRINE HCL 10 MG PO TABS: 10.0000 mg | ORAL_TABLET | Freq: Three times a day (TID) | ORAL | 0 refills | Status: DC | PRN

## 2017-04-18 MED ORDER — CYCLOBENZAPRINE HCL 10 MG PO TABS
5.0000 mg | ORAL_TABLET | Freq: Once | ORAL | Status: AC
  Administered 2017-04-18: 5 mg via ORAL
  Filled 2017-04-18: qty 1

## 2017-04-18 MED ORDER — HYDROCODONE-ACETAMINOPHEN 5-325 MG PO TABS
1.0000 | ORAL_TABLET | Freq: Once | ORAL | Status: AC
  Administered 2017-04-18: 1 via ORAL
  Filled 2017-04-18: qty 1
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
## 2017-05-28 ENCOUNTER — Emergency Department (HOSPITAL_COMMUNITY): Payer: Medicare Other

## 2017-05-28 ENCOUNTER — Emergency Department (HOSPITAL_COMMUNITY)
Admission: EM | Admit: 2017-05-28 | Discharge: 2017-05-28 | Disposition: A | Discharge status: Home / Self Care | Payer: Medicare Other | Attending: Emergency Medicine | Admitting: Emergency Medicine

## 2017-05-28 ENCOUNTER — Encounter (HOSPITAL_COMMUNITY): Payer: Self-pay | Admitting: Emergency Medicine

## 2017-05-28 ENCOUNTER — Other Ambulatory Visit: Payer: Self-pay

## 2017-05-28 DIAGNOSIS — Z8673 Personal history of transient ischemic attack (TIA), and cerebral infarction without residual deficits: Secondary | ICD-10-CM | POA: Insufficient documentation

## 2017-05-28 DIAGNOSIS — E119 Type 2 diabetes mellitus without complications: Secondary | ICD-10-CM | POA: Diagnosis not present

## 2017-05-28 DIAGNOSIS — I10 Essential (primary) hypertension: Secondary | ICD-10-CM | POA: Diagnosis not present

## 2017-05-28 DIAGNOSIS — R609 Edema, unspecified: Secondary | ICD-10-CM

## 2017-05-28 DIAGNOSIS — I252 Old myocardial infarction: Secondary | ICD-10-CM | POA: Diagnosis not present

## 2017-05-28 DIAGNOSIS — Z79899 Other long term (current) drug therapy: Secondary | ICD-10-CM | POA: Diagnosis not present

## 2017-05-28 DIAGNOSIS — Z7982 Long term (current) use of aspirin: Secondary | ICD-10-CM | POA: Diagnosis not present

## 2017-05-28 DIAGNOSIS — F329 Major depressive disorder, single episode, unspecified: Secondary | ICD-10-CM | POA: Diagnosis not present

## 2017-05-28 DIAGNOSIS — R600 Localized edema: Secondary | ICD-10-CM | POA: Diagnosis not present

## 2017-05-28 DIAGNOSIS — R2243 Localized swelling, mass and lump, lower limb, bilateral: Secondary | ICD-10-CM | POA: Diagnosis present

## 2017-05-28 LAB — CBC WITH DIFFERENTIAL/PLATELET
Basophils Absolute: 0 10*3/uL (ref 0.0–0.1)
Basophils Relative: 0 %
Eosinophils Absolute: 0.1 10*3/uL (ref 0.0–0.7)
Eosinophils Relative: 1 %
HCT: 35.8 % — ABNORMAL LOW (ref 36.0–46.0)
Hemoglobin: 11.6 g/dL — ABNORMAL LOW (ref 12.0–15.0)
Lymphocytes Relative: 19 %
Lymphs Abs: 1 10*3/uL (ref 0.7–4.0)
MCH: 30.1 pg (ref 26.0–34.0)
MCHC: 32.4 g/dL (ref 30.0–36.0)
MCV: 92.7 fL (ref 78.0–100.0)
Monocytes Absolute: 0.3 10*3/uL (ref 0.1–1.0)
Monocytes Relative: 7 %
Neutro Abs: 3.8 10*3/uL (ref 1.7–7.7)
Neutrophils Relative %: 73 %
Platelets: 231 10*3/uL (ref 150–400)
RBC: 3.86 MIL/uL — ABNORMAL LOW (ref 3.87–5.11)
RDW: 12.7 % (ref 11.5–15.5)
WBC: 5.2 10*3/uL (ref 4.0–10.5)

## 2017-05-28 LAB — COMPREHENSIVE METABOLIC PANEL
ALT: 15 U/L (ref 14–54)
AST: 23 U/L (ref 15–41)
Albumin: 3.7 g/dL (ref 3.5–5.0)
Alkaline Phosphatase: 90 U/L (ref 38–126)
Anion gap: 9 (ref 5–15)
BUN: 12 mg/dL (ref 6–20)
CO2: 27 mmol/L (ref 22–32)
Calcium: 9.1 mg/dL (ref 8.9–10.3)
Chloride: 102 mmol/L (ref 101–111)
Creatinine, Ser: 1.08 mg/dL — ABNORMAL HIGH (ref 0.44–1.00)
GFR calc Af Amer: 59 mL/min — ABNORMAL LOW (ref >60)
GFR calc non Af Amer: 51 mL/min — ABNORMAL LOW (ref >60)
Glucose, Bld: 147 mg/dL — ABNORMAL HIGH (ref 65–99)
Potassium: 3.7 mmol/L (ref 3.5–5.1)
Sodium: 138 mmol/L (ref 135–145)
Total Bilirubin: 0.6 mg/dL (ref 0.3–1.2)
Total Protein: 7.9 g/dL (ref 6.5–8.1)

## 2017-05-28 LAB — BRAIN NATRIURETIC PEPTIDE: B Natriuretic Peptide: 43.3 pg/mL (ref 0.0–100.0)

## 2017-05-28 MED ORDER — FUROSEMIDE 20 MG PO TABS: 20.0000 mg | ORAL_TABLET | Freq: Every day | ORAL | 1 refills | Status: DC

## 2017-05-28 MED ORDER — POTASSIUM CHLORIDE ER 10 MEQ PO TBCR: 10.0000 meq | EXTENDED_RELEASE_TABLET | Freq: Every day | ORAL | 1 refills | Status: DC
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
## 2017-05-28 NOTE — Discharge Instructions (Addendum)
Follow up with your primary care doctor in a week or two to make sure you are improving.  Wear compression stockings during the day to help prevent swelling.  Take the lasix and the potassium medication in the morning. Return to the ED if you start having trouble with shortness of breath.
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
## 2018-02-10 ENCOUNTER — Encounter (HOSPITAL_COMMUNITY): Payer: Self-pay | Admitting: *Deleted

## 2018-02-10 ENCOUNTER — Emergency Department (HOSPITAL_COMMUNITY)
Admission: EM | Admit: 2018-02-10 | Discharge: 2018-02-10 | Disposition: A | Discharge status: Home / Self Care | Payer: Medicare Other | Attending: Emergency Medicine | Admitting: Emergency Medicine

## 2018-02-10 ENCOUNTER — Emergency Department (HOSPITAL_COMMUNITY): Payer: Medicare Other

## 2018-02-10 DIAGNOSIS — R519 Headache, unspecified: Secondary | ICD-10-CM

## 2018-02-10 DIAGNOSIS — Z7982 Long term (current) use of aspirin: Secondary | ICD-10-CM | POA: Insufficient documentation

## 2018-02-10 DIAGNOSIS — I10 Essential (primary) hypertension: Secondary | ICD-10-CM | POA: Insufficient documentation

## 2018-02-10 DIAGNOSIS — R51 Headache: Secondary | ICD-10-CM | POA: Diagnosis present

## 2018-02-10 DIAGNOSIS — E119 Type 2 diabetes mellitus without complications: Secondary | ICD-10-CM | POA: Diagnosis not present

## 2018-02-10 DIAGNOSIS — M545 Low back pain, unspecified: Secondary | ICD-10-CM

## 2018-02-10 DIAGNOSIS — Z79899 Other long term (current) drug therapy: Secondary | ICD-10-CM | POA: Insufficient documentation

## 2018-02-10 LAB — BASIC METABOLIC PANEL
Anion gap: 15 (ref 5–15)
BUN: 13 mg/dL (ref 8–23)
CO2: 28 mmol/L (ref 22–32)
Calcium: 9 mg/dL (ref 8.9–10.3)
Chloride: 92 mmol/L — ABNORMAL LOW (ref 98–111)
Creatinine, Ser: 1.09 mg/dL — ABNORMAL HIGH (ref 0.44–1.00)
GFR calc Af Amer: 59 mL/min — ABNORMAL LOW (ref >60)
GFR calc non Af Amer: 51 mL/min — ABNORMAL LOW (ref >60)
Glucose, Bld: 350 mg/dL — ABNORMAL HIGH (ref 70–99)
Potassium: 3.1 mmol/L — ABNORMAL LOW (ref 3.5–5.1)
Sodium: 135 mmol/L (ref 135–145)

## 2018-02-10 LAB — CBC
HCT: 39.4 % (ref 36.0–46.0)
Hemoglobin: 12.8 g/dL (ref 12.0–15.0)
MCH: 29.5 pg (ref 26.0–34.0)
MCHC: 32.5 g/dL (ref 30.0–36.0)
MCV: 90.8 fL (ref 80.0–100.0)
Platelets: 267 10*3/uL (ref 150–400)
RBC: 4.34 MIL/uL (ref 3.87–5.11)
RDW: 12.1 % (ref 11.5–15.5)
WBC: 8.4 10*3/uL (ref 4.0–10.5)
nRBC: 0 % (ref 0.0–0.2)

## 2018-02-10 LAB — I-STAT TROPONIN, ED: Troponin i, poc: 0.01 ng/mL (ref 0.00–0.08)

## 2018-02-10 MED ORDER — SODIUM CHLORIDE 0.9% FLUSH: 3.0000 mL | Freq: Once | INTRAVENOUS | Status: DC

## 2018-02-10 MED ORDER — METHOCARBAMOL 500 MG PO TABS: 500.0000 mg | ORAL_TABLET | Freq: Three times a day (TID) | ORAL | 0 refills | Status: DC | PRN

## 2018-02-10 MED ORDER — CLONIDINE HCL 0.1 MG PO TABS
0.1000 mg | ORAL_TABLET | Freq: Once | ORAL | Status: AC
Start: 2018-02-10 — End: 2018-02-10
  Administered 2018-02-10: 0.1 mg via ORAL
  Filled 2018-02-10: qty 1

## 2018-02-10 MED ORDER — ACETAMINOPHEN 500 MG PO TABS: 1000.0000 mg | ORAL_TABLET | Freq: Four times a day (QID) | ORAL | 0 refills | Status: DC | PRN

## 2018-02-10 MED ORDER — HYDRALAZINE HCL 25 MG PO TABS
25.0000 mg | ORAL_TABLET | Freq: Once | ORAL | Status: AC
  Administered 2018-02-10: 25 mg via ORAL
  Filled 2018-02-10: qty 1

## 2018-02-10 MED ORDER — HYDRALAZINE HCL 25 MG PO TABS: 25.0000 mg | ORAL_TABLET | Freq: Three times a day (TID) | ORAL | 1 refills | Status: DC

## 2018-02-10 MED ORDER — METHOCARBAMOL 1000 MG/10ML IJ SOLN
500.0000 mg | Freq: Once | INTRAMUSCULAR | Status: AC
  Administered 2018-02-10: 500 mg via INTRAMUSCULAR
  Filled 2018-02-10 (×2): qty 5

## 2018-02-10 MED ORDER — ACETAMINOPHEN 500 MG PO TABS
1000.0000 mg | ORAL_TABLET | Freq: Once | ORAL | Status: AC
  Administered 2018-02-10: 1000 mg via ORAL
  Filled 2018-02-10: qty 2

## 2018-02-10 NOTE — Discharge Instructions (Signed)
1.  Your blood pressure was elevated in the emergency department.  It is very important that you see your family doctor to review all of your blood pressure medications and make sure that your doses are appropriate for you. 2.  Check your blood pressure 3-4 times a day and write down the numbers.  Take this to your doctor when you go to your next appointment. 3.  You have 2 medications listed as your regular medications which are hydralazine (Apresoline) and clonidine (Catapres).  Continue your regular blood pressure medications as prescribed.  You may take an afternoon dose of hydralazine 25 mg and clonidine 0.1 mg.  If your blood pressures are less than 150\70 do not take the afternoon doses of hydralazine and clonidine. 4.  Return to the emergency department if your symptoms are worsening or changing.

## 2018-02-10 NOTE — ED Notes (Signed)
Patient verbalizes understanding of discharge instructions. Opportunity for questioning and answers were provided. Armband removed by staff, pt discharged from ED ambulatory to home.  

## 2018-02-10 NOTE — ED Notes (Signed)
Messaged pharmacy about missing medication.

## 2018-02-10 NOTE — ED Provider Notes (Signed)
[facility] EMERGENCY DEPARTMENT Provider Note   CSN: [id number] Arrival date & time: 02/10/18  1457     History   Chief Complaint No chief complaint on file.   HPI [name] is a 73 y.o. female.  HPI Patient is alert and interactive.  She is however a slightly challenging historian in the sense that she does not specify very well which are the current problems as opposed to chronic symptoms.  Patient initially reported that she was here because of low back pain and headache.  She describes a headache as being pressure and on the top of her head.  She denies any associated visual problems of blurred vision double vision or decreased vision.  She denies any weakness numbness tingling or incoordination.  She denies that the lower back pain is radiating into her legs or abdomen.  As far as how long these things have been going on, most all symptoms she harkins back to the time of her "my chart MI and stroke" (notably however, EMR review of these events shows that in 2014 patient was evaluated for a paresthesia presentation but had negative MRIs MRAs with unclear etiology of symptoms.  In 2015 she had mild troponin elevation that plateaued at a low number and had a clean catheterization with ultimately unclear etiology of mild troponin elevation designated as equivocal and STEMI).  Almost all symptoms seem to have been present in some way since that time.  She reports also she often has pain in her left breast and left chest wall.  She reports that it  is very sensitive to touch and if anybody pushes too much on her breast it will cause her a lot of pain.  This again is something she correlates back to having had a heart attack and stroke.  She does report she has gone for mammograms but cannot really specify when, she reports she will be getting scheduled for another routine mammogram but does not know when.  Patient reports she gets swelling in her legs but it gets better because  she elevates her legs and wears compression hose as instructed.  She reports that she takes her medications as instructed but in reality, she admits she does not know which medications are for blood pressure or for other things.  She has some concerns though that she is not getting enough of the right medication. Past Medical History:  Diagnosis Date  . Diabetes mellitus without complication (HCC)   . GERD 09/04/2006   Qualifier: Diagnosis of  By: Duke Salvia    . History of echocardiogram    a. 2D ECHO: 11/06/2013 EF 65%; no WMA. Mild TR. PA pk pressure 43 mm HG  . Hypertension   . Stroke [facility])     Patient Active Problem List   Diagnosis Date Noted  . NSTEMI (non-ST elevated myocardial infarction) (HCC) 11/05/2013  . Spinal stenosis of cervical region 10/20/2012  . Prolonged QT interval 10/18/2012  . Left sided numbness 10/18/2012  . Palpitations 10/18/2012  . 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 Numbness 06/20/2012  . Diabetes mellitus (HCC) 06/20/2012  . CVA (cerebral infarction) 06/20/2012  . Hypokalemia 06/20/2012  . ARTHRALGIA 01/28/2009  . CHEST PAIN UNSPECIFIED 11/26/2008  . DEPRESSION 09/04/2006  . Essential hypertension 09/04/2006  . GERD 09/04/2006  . MULTIPLE SITES, ARTHRITIS-ACUTE/CHRONIC 09/04/2006  . LOW BACK PAIN, CHRONIC 09/04/2006    Past Surgical History:  Procedure Laterality Date  . ABDOMINAL HYSTERECTOMY    . LEFT HEART  CATHETERIZATION WITH CORONARY ANGIOGRAM N/A 11/05/2013   Procedure: LEFT HEART CATHETERIZATION WITH CORONARY ANGIOGRAM;  Surgeon: Lennette Bihari, MD;  Location: [facility] CATH LAB;  Service: Cardiovascular;  Laterality: N/A;     OB History   No obstetric history on file.      Home Medications    Prior to Admission medications   Medication Sig Start Date End Date Taking? Authorizing Provider  acetaminophen (TYLENOL) 500 MG tablet Take 2 tablets (1,000 mg total) by mouth every 6 (six) hours as needed. 02/10/18   Arby Barrette, MD  amLODipine (NORVASC) 10 MG tablet Take 10 mg by mouth daily.    [provider]  amLODipine (NORVASC) 5 MG tablet Take 1 tablet (5 mg total) by mouth daily. 11/06/13   Janetta Hora, PA-C  aspirin EC 81 MG tablet Take 81 mg by mouth daily.    [provider]  atorvastatin (LIPITOR) 10 MG tablet Take 1 tablet (10 mg total) by mouth daily at 6 PM. 06/27/12   Angiulli, Mcarthur Rossetti, PA-C  cloNIDine (CATAPRES) 0.2 MG tablet Take 0.2 mg by mouth 2 (two) times daily.    [provider]  cyclobenzaprine (FLEXERIL) 10 MG tablet Take 1 tablet (10 mg total) by mouth 3 (three) times daily as needed for muscle spasms. 04/18/17   Azalia Bilis, MD  furosemide (LASIX) 20 MG tablet Take 1 tablet (20 mg total) by mouth daily. 05/28/17   Linwood Dibbles, MD  gabapentin (NEURONTIN) 300 MG capsule Take 300 mg by mouth every evening. Reported on 04/22/2015 03/29/15   [provider]  hydrALAZINE (APRESOLINE) 25 MG tablet Take 1 tablet (25 mg total) by mouth 3 (three) times daily. 02/10/18   Arby Barrette, MD  hydrALAZINE (APRESOLINE) 50 MG tablet Take 50 mg by mouth 2 (two) times daily.    [provider]  losartan (COZAAR) 50 MG tablet Take 50 mg by mouth daily.    [provider]  methocarbamol (ROBAXIN) 500 MG tablet Take 1-2 tablets (500-1,000 mg total) by mouth every 8 (eight) hours as needed for muscle spasms (and pain). Patient not taking: Reported on 04/15/2017 03/24/15   Trixie Dredge, PA-C  methocarbamol (ROBAXIN) 500 MG tablet Take 1 tablet (500 mg total) by mouth every 8 (eight) hours as needed for muscle spasms. Patient not taking: Reported on 04/15/2017 03/28/15   Mackuen, Courteney Lyn, MD  methocarbamol (ROBAXIN) 500 MG tablet Take 1 tablet (500 mg total) by mouth every 8 (eight) hours as needed for muscle spasms. 02/10/18   Arby Barrette, MD  nitroGLYCERIN (NITROSTAT) 0.4 MG SL tablet Place 1 tablet (0.4 mg total) under the tongue every 5 (five) minutes x  3 doses as needed for chest pain. 11/06/13   Janetta Hora, PA-C  potassium chloride (K-DUR) 10 MEQ tablet Take 1 tablet (10 mEq total) by mouth daily. 05/28/17   Linwood Dibbles, MD    Family History History reviewed. No pertinent family history.  Social History Social History   Tobacco Use  . Smoking status: Never Smoker  . Smokeless tobacco: Never Used  Substance Use Topics  . Alcohol use: No  . Drug use: No     Allergies   Codeine; Ibuprofen; and Imdur [isosorbide nitrate]   Review of Systems Review of Systems 10 Systems reviewed and are negative for acute change except as noted in the HPI.   Physical Exam Updated Vital Signs BP (!) 155/71   Pulse 96   Temp 98 F (36.7 C) (Oral)  Resp 20   SpO2 99%   Physical Exam Constitutional:      Appearance: She is well-developed.  HENT:     Head: Normocephalic and atraumatic.     Mouth/Throat:     Mouth: Mucous membranes are moist.  Eyes:     Extraocular Movements: Extraocular movements intact.     Pupils: Pupils are equal, round, and reactive to light.  Neck:     Musculoskeletal: Neck supple.  Cardiovascular:     Rate and Rhythm: Normal rate and regular rhythm.     Heart sounds: Normal heart sounds.  Pulmonary:     Effort: Pulmonary effort is normal.     Breath sounds: Normal breath sounds.  Abdominal:     General: Bowel sounds are normal. There is no distension.     Palpations: Abdomen is soft.     Tenderness: There is no abdominal tenderness.  Musculoskeletal: Normal range of motion.  Skin:    General: Skin is warm and dry.  Neurological:     Mental Status: She is alert and oriented to person, place, and time.     GCS: GCS eye subscore is 4. GCS verbal subscore is 5. GCS motor subscore is 6.     Coordination: Coordination normal.      ED Treatments / Results  Labs (all labs ordered are listed, but only abnormal results are displayed) Labs Reviewed  BASIC METABOLIC PANEL - Abnormal; Notable for the  following components:      Result Value   Potassium 3.1 (*)    Chloride 92 (*)    Glucose, Bld 350 (*)    Creatinine, Ser 1.09 (*)    GFR calc non Af Amer 51 (*)    GFR calc Af Amer 59 (*)    All other components within normal limits  CBC  I-STAT TROPONIN, ED    EKG EKG will not allow read or import from MUSE. No sig change from previous.   Radiology Dg Chest 2 View  Result Date: 02/10/2018 CLINICAL DATA:  Left-sided chest pain starting ?a while? ago. History of myocardial infarction and feel similar. EXAM: CHEST - 2 VIEW COMPARISON:  05/28/2017 FINDINGS: The heart size and mediastinal contours are within normal limits. No overt pulmonary edema or consolidations. Chronic mild bronchitic change of the lungs with interstitial prominence is redemonstrated. The visualized skeletal structures are unremarkable. IMPRESSION: No active cardiopulmonary disease. Chronic mild bronchitic change of the lungs. Electronically Signed   By: Tollie Ethavid  Kwon M.D.   On: 02/10/2018 15:40   Ct Head Wo Contrast  Result Date: 02/10/2018 CLINICAL DATA:  73 year old female with chronic headache. EXAM: CT HEAD WITHOUT CONTRAST TECHNIQUE: Contiguous axial images were obtained from the base of the skull through the vertex without intravenous contrast. COMPARISON:  Head CT dated 02/18/2013 FINDINGS: Brain: The ventricles and sulci appropriate size for patient's age. Minimal periventricular and deep white matter chronic microvascular ischemic changes noted. There is no acute intracranial hemorrhage. No mass effect or midline shift. No extra-axial fluid collection. Vascular: No hyperdense vessel or unexpected calcification. Skull: Normal. Negative for fracture or focal lesion. Sinuses/Orbits: No acute finding. Other: None IMPRESSION: No acute intracranial pathology. Electronically Signed   By: Elgie CollardArash  Radparvar M.D.   On: 02/10/2018 19:56    Procedures Procedures (including critical care time)  Medications Ordered in  ED Medications  sodium chloride flush (NS) 0.9 % injection 3 mL (has no administration in time range)  hydrALAZINE (APRESOLINE) tablet 25 mg (25 mg Oral Given 02/10/18  1655)  cloNIDine (CATAPRES) tablet 0.1 mg (0.1 mg Oral Given 02/10/18 1655)  methocarbamol (ROBAXIN) injection 500 mg (500 mg Intramuscular Given 02/10/18 1902)  acetaminophen (TYLENOL) tablet 1,000 mg (1,000 mg Oral Given 02/10/18 1655)     Initial Impression / Assessment and Plan / ED Course  I have reviewed the triage vital signs and the nursing notes.  Pertinent labs & imaging results that were available during my care of the patient were reviewed by me and considered in my medical decision making (see chart for details).  Clinical Course as of Feb 10 2013  Mon Feb 10, 2018  1610 Blood pressure is improving.  Most recent pressure 150s over 70s.  Patient is up and ambulatory to the bathroom.  Robaxin has not come up from pharmacy yet.   [MP]    Clinical Course User Index [MP] Arby Barrette, MD   Patient's blood pressure has improved to 150s over 70s.  She is comfortable with no headache and no other complaints at this time.  Patient is headache did not have any associated neurologic symptoms.  CT shows no acute findings.  Patient's mental status is clear.  She is not encephalopathic.  Headache may be secondary to hypertensive urgency.  Patient is counseled on taking a midday dose of hydralazine and clonidine if her blood pressures are at 150s over 70s or greater.  Patient reports she is going to follow-up with her doctor ASAP to review all of her blood pressure medications and keep a log in the meantime.  At this time patient stable for discharge.  She is alert in good condition with follow-up plan and return precautions reviewed.  Final Clinical Impressions(s) / ED Diagnoses   Final diagnoses:  Bad headache  Essential hypertension  Midline low back pain without sciatica, unspecified chronicity    ED Discharge Orders          Ordered    methocarbamol (ROBAXIN) 500 MG tablet  Every 8 hours PRN     02/10/18 2010    acetaminophen (TYLENOL) 500 MG tablet  Every 6 hours PRN     02/10/18 2010    hydrALAZINE (APRESOLINE) 25 MG tablet  3 times daily     02/10/18 2010           Arby Barrette, MD 02/10/18 2017

## 2018-02-10 NOTE — ED Triage Notes (Signed)
Pt in c/o left sided chest pain that started "awhile" ago, history of MI and states this feels similar, denies shortness of breath or n/v, no distress noted

## 2018-02-10 NOTE — ED Notes (Signed)
Wasted 500 mg of Robaxin with Gena Fray

## 2018-02-10 NOTE — ED Notes (Signed)
Messaged pharmacy about missing Robaxin medication

## 2018-02-10 NOTE — ED Notes (Signed)
Patient transported to CT 

## 2018-03-03 ENCOUNTER — Emergency Department (HOSPITAL_COMMUNITY): Payer: Medicare Other

## 2018-03-03 ENCOUNTER — Emergency Department (HOSPITAL_COMMUNITY)
Admission: EM | Admit: 2018-03-03 | Discharge: 2018-03-03 | Disposition: A | Discharge status: Home / Self Care | Payer: Medicare Other | Attending: Emergency Medicine | Admitting: Emergency Medicine

## 2018-03-03 ENCOUNTER — Encounter (HOSPITAL_COMMUNITY): Payer: Self-pay | Admitting: Emergency Medicine

## 2018-03-03 DIAGNOSIS — Z7982 Long term (current) use of aspirin: Secondary | ICD-10-CM | POA: Insufficient documentation

## 2018-03-03 DIAGNOSIS — Z79899 Other long term (current) drug therapy: Secondary | ICD-10-CM | POA: Insufficient documentation

## 2018-03-03 DIAGNOSIS — Z8673 Personal history of transient ischemic attack (TIA), and cerebral infarction without residual deficits: Secondary | ICD-10-CM | POA: Insufficient documentation

## 2018-03-03 DIAGNOSIS — Y9389 Activity, other specified: Secondary | ICD-10-CM | POA: Insufficient documentation

## 2018-03-03 DIAGNOSIS — I10 Essential (primary) hypertension: Secondary | ICD-10-CM | POA: Insufficient documentation

## 2018-03-03 DIAGNOSIS — M25512 Pain in left shoulder: Secondary | ICD-10-CM | POA: Insufficient documentation

## 2018-03-03 DIAGNOSIS — Y999 Unspecified external cause status: Secondary | ICD-10-CM | POA: Insufficient documentation

## 2018-03-03 DIAGNOSIS — M25552 Pain in left hip: Secondary | ICD-10-CM | POA: Diagnosis not present

## 2018-03-03 DIAGNOSIS — Y92481 Parking lot as the place of occurrence of the external cause: Secondary | ICD-10-CM | POA: Diagnosis not present

## 2018-03-03 DIAGNOSIS — W101XXA Fall (on)(from) sidewalk curb, initial encounter: Secondary | ICD-10-CM | POA: Insufficient documentation

## 2018-03-03 DIAGNOSIS — I252 Old myocardial infarction: Secondary | ICD-10-CM | POA: Diagnosis not present

## 2018-03-03 DIAGNOSIS — E119 Type 2 diabetes mellitus without complications: Secondary | ICD-10-CM | POA: Diagnosis not present

## 2018-03-03 DIAGNOSIS — W19XXXA Unspecified fall, initial encounter: Secondary | ICD-10-CM

## 2018-03-03 MED ORDER — LIDOCAINE 5 % EX PTCH
1.0000 | MEDICATED_PATCH | CUTANEOUS | Status: DC
  Administered 2018-03-03: 1 via TRANSDERMAL
  Filled 2018-03-03: qty 1

## 2018-03-03 MED ORDER — ACETAMINOPHEN 325 MG PO TABS
650.0000 mg | ORAL_TABLET | Freq: Once | ORAL | Status: AC
  Administered 2018-03-03: 650 mg via ORAL
  Filled 2018-03-03: qty 2

## 2018-03-03 MED ORDER — LIDOCAINE 5 % EX PTCH: 1.0000 | MEDICATED_PATCH | CUTANEOUS | 0 refills | Status: DC

## 2018-03-03 NOTE — ED Notes (Addendum)
Pt ambulated independently with this RN. EDP aware.

## 2018-03-03 NOTE — ED Triage Notes (Signed)
Pt states on 2/3 she was walking into [facility] when she fell on concrete and is having bilateral hip pain. Pt denies any LOC.

## 2018-03-03 NOTE — ED Notes (Signed)
Patient transported to X-ray 

## 2018-03-03 NOTE — Discharge Instructions (Addendum)
Please see the information and instructions below regarding your visit.  Your diagnoses today include:  1. Fall, initial encounter   2. Left hip pain     Tests performed today include: See side panel of your discharge paperwork for testing performed today. Vital signs are listed at the bottom of these instructions.   Medications prescribed:    Take any prescribed medications only as prescribed, and any over the counter medications only as directed on the packaging.  He may take Tylenol, 650 mg every 6 hours as needed for pain.  Please do not exceed 4000 mg in 1 day.  Please apply lidocaine patch to the areas that are hurting every 24 hours.  Home care instructions:  Please follow any educational materials contained in this packet.   Follow-up instructions: Please follow-up with your primary care provider in one week for further evaluation of your symptoms if they are not completely improved.    Return instructions:  Please return to the Emergency Department if you experience worsening symptoms.  Please return the emergency department if you develop any worsening pain, inability to walk, or numbness or tingling in her extremities. Please return if you have any other emergent concerns.  Additional Information:   Your vital signs today were: BP (!) 146/62    Pulse 88    Temp 97.6 F (36.4 C) (Oral)    Resp 16    SpO2 98%  If your blood pressure (BP) was elevated on multiple readings during this visit above 130 for the top number or above 80 for the bottom number, please have this repeated by your primary care provider within one month. --------------  Thank you for allowing [place] to participate in your care today.

## 2018-03-03 NOTE — ED Provider Notes (Signed)
[facility] EMERGENCY DEPARTMENT Provider Note   CSN: [id number] Arrival date & time: 03/03/18  8016     History   Chief Complaint Chief Complaint  Patient presents with  . Hip Pain    HPI [name] is a 73 y.o. female.  HPI  Patient is a 73 year old female with a history of diabetes mellitus and hypertension presenting for fall and joint pain.  Patient reports that she fell 1 week ago when she tried to step up onto a curb at [facility].  She reports this was a mechanical fall and she lost her footing while stepping up.  She denies any shortness of breath, chest pain, syncope presyncope at that time.  She did not hit her head at the time.  She reports that she fell back onto her left hip, and twisted her left ankle and hurt her left shoulder.  She reports diffuse arthralgias since then.  She reports that she is able to walk without difficulty.  She denies any weakness or numbness in extremities.  She is taking periodic Tylenol for her symptoms.  No blood thinning medications.  Past Medical History:  Diagnosis Date  . Diabetes mellitus without complication (HCC)   . GERD 09/04/2006   Qualifier: Diagnosis of  By: Duke Salvia    . History of echocardiogram    a. 2D ECHO: 11/06/2013 EF 65%; no WMA. Mild TR. PA pk pressure 43 mm HG  . Hypertension   . Stroke [facility])     Patient Active Problem List   Diagnosis Date Noted  . NSTEMI (non-ST elevated myocardial infarction) (HCC) 11/05/2013  . Spinal stenosis of cervical region 10/20/2012  . Prolonged QT interval 10/18/2012  . Left sided numbness 10/18/2012  . Palpitations 10/18/2012  . 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 Numbness 06/20/2012  . Diabetes mellitus (HCC) 06/20/2012  . CVA (cerebral infarction) 06/20/2012  . Hypokalemia 06/20/2012  . ARTHRALGIA 01/28/2009  . CHEST PAIN UNSPECIFIED 11/26/2008  . DEPRESSION 09/04/2006  . Essential hypertension 09/04/2006  . GERD 09/04/2006  . MULTIPLE  SITES, ARTHRITIS-ACUTE/CHRONIC 09/04/2006  . LOW BACK PAIN, CHRONIC 09/04/2006    Past Surgical History:  Procedure Laterality Date  . ABDOMINAL HYSTERECTOMY    . LEFT HEART CATHETERIZATION WITH CORONARY ANGIOGRAM N/A 11/05/2013   Procedure: LEFT HEART CATHETERIZATION WITH CORONARY ANGIOGRAM;  Surgeon: Lennette Bihari, MD;  Location: [facility] CATH LAB;  Service: Cardiovascular;  Laterality: N/A;     OB History   No obstetric history on file.      Home Medications    Prior to Admission medications   Medication Sig Start Date End Date Taking? Authorizing Provider  acetaminophen (TYLENOL) 500 MG tablet Take 2 tablets (1,000 mg total) by mouth every 6 (six) hours as needed. 02/10/18   Arby Barrette, MD  amLODipine (NORVASC) 10 MG tablet Take 10 mg by mouth daily.    [provider]  amLODipine (NORVASC) 5 MG tablet Take 1 tablet (5 mg total) by mouth daily. 11/06/13   Janetta Hora, PA-C  aspirin EC 81 MG tablet Take 81 mg by mouth daily.    [provider]  atorvastatin (LIPITOR) 10 MG tablet Take 1 tablet (10 mg total) by mouth daily at 6 PM. 06/27/12   Angiulli, Mcarthur Rossetti, PA-C  cloNIDine (CATAPRES) 0.2 MG tablet Take 0.2 mg by mouth 2 (two) times daily.    [provider]  cyclobenzaprine (FLEXERIL) 10 MG tablet Take 1 tablet (10 mg total) by mouth 3 (  three) times daily as needed for muscle spasms. 04/18/17   Azalia Bilis, MD  furosemide (LASIX) 20 MG tablet Take 1 tablet (20 mg total) by mouth daily. 05/28/17   Linwood Dibbles, MD  gabapentin (NEURONTIN) 300 MG capsule Take 300 mg by mouth every evening. Reported on 04/22/2015 03/29/15   [provider]  hydrALAZINE (APRESOLINE) 25 MG tablet Take 1 tablet (25 mg total) by mouth 3 (three) times daily. 02/10/18   Arby Barrette, MD  hydrALAZINE (APRESOLINE) 50 MG tablet Take 50 mg by mouth 2 (two) times daily.    [provider]  lidocaine (LIDODERM) 5 % Place 1 patch onto the skin daily. Remove &  Discard patch within 12 hours or as directed by MD 03/03/18   Aviva Kluver B, PA-C  losartan (COZAAR) 50 MG tablet Take 50 mg by mouth daily.    [provider]  methocarbamol (ROBAXIN) 500 MG tablet Take 1-2 tablets (500-1,000 mg total) by mouth every 8 (eight) hours as needed for muscle spasms (and pain). Patient not taking: Reported on 04/15/2017 03/24/15   Trixie Dredge, PA-C  methocarbamol (ROBAXIN) 500 MG tablet Take 1 tablet (500 mg total) by mouth every 8 (eight) hours as needed for muscle spasms. Patient not taking: Reported on 04/15/2017 03/28/15   Mackuen, Courteney Lyn, MD  methocarbamol (ROBAXIN) 500 MG tablet Take 1 tablet (500 mg total) by mouth every 8 (eight) hours as needed for muscle spasms. 02/10/18   Arby Barrette, MD  nitroGLYCERIN (NITROSTAT) 0.4 MG SL tablet Place 1 tablet (0.4 mg total) under the tongue every 5 (five) minutes x 3 doses as needed for chest pain. 11/06/13   Janetta Hora, PA-C  potassium chloride (K-DUR) 10 MEQ tablet Take 1 tablet (10 mEq total) by mouth daily. 05/28/17   Linwood Dibbles, MD    Family History No family history on file.  Social History Social History   Tobacco Use  . Smoking status: Never Smoker  . Smokeless tobacco: Never Used  Substance Use Topics  . Alcohol use: No  . Drug use: No     Allergies   Codeine; Ibuprofen; and Imdur [isosorbide nitrate]   Review of Systems Review of Systems  Constitutional: Negative for chills and fever.  HENT: Negative for congestion and sore throat.   Eyes: Negative for visual disturbance.  Respiratory: Negative for chest tightness and shortness of breath.   Cardiovascular: Negative for chest pain, palpitations and leg swelling.  Gastrointestinal: Negative for abdominal pain, nausea and vomiting.  Genitourinary: Negative for dysuria and flank pain.  Musculoskeletal: Positive for arthralgias, back pain and myalgias.  Skin: Negative for rash.  Neurological: Negative for dizziness,  syncope, light-headedness and headaches.     Physical Exam Updated Vital Signs BP (!) 146/62   Pulse 88   Temp 97.6 F (36.4 C) (Oral)   Resp 16   SpO2 98%   Physical Exam Vitals signs and nursing note reviewed.  Constitutional:      General: She is not in acute distress.    Appearance: She is well-developed.  HENT:     Head: Normocephalic and atraumatic.  Eyes:     Conjunctiva/sclera: Conjunctivae normal.     Pupils: Pupils are equal, round, and reactive to light.  Neck:     Musculoskeletal: Normal range of motion and neck supple.  Cardiovascular:     Rate and Rhythm: Normal rate and regular rhythm.     Heart sounds: S1 normal and S2 normal. No murmur.  Pulmonary:  Effort: Pulmonary effort is normal.     Breath sounds: Normal breath sounds. No wheezing or rales.  Abdominal:     General: There is no distension.     Palpations: Abdomen is soft.     Tenderness: There is no abdominal tenderness. There is no guarding.  Musculoskeletal: Normal range of motion.        General: Tenderness present. No deformity.       Arms:     Comments: Patient has tenderness to palpation of the left humeral head.  She is able to perform full range of motion with flexion cross-section, abduction and adduction of the left shoulder. Patient has no midline tenderness of cervical, thoracic, or lumbar spine. Patient has tenderness to palpation of the left posterior hip around the ischial tuberosity.  Patient has full range of motion with logrolling, and abduction and adduction of the left lower extremity.  Left ankle without focal tenderness of medial or lateral malleolus.  No focal tenderness palpation of the navicular or fifth meta tarsal.  Patient reports anterior tenderness.  Patient is able to flex and extend the left ankle without difficulty. 2+ DP pulses bilaterally.  Full sensation to light touch in distal bilateral lower extremities.  Lymphadenopathy:     Cervical: No cervical adenopathy.   Skin:    General: Skin is warm and dry.     Findings: No erythema or rash.       Neurological:     Mental Status: She is alert.     Comments: Cranial nerves grossly intact. Patient moves extremities symmetrically and with good coordination.  Psychiatric:        Behavior: Behavior normal.        Thought Content: Thought content normal.        Judgment: Judgment normal.      ED Treatments / Results  Labs (all labs ordered are listed, but only abnormal results are displayed) Labs Reviewed - No data to display  EKG None  Radiology Dg Ankle Complete Left  Result Date: 03/03/2018 CLINICAL DATA:  Fall today with left ankle pain. EXAM: LEFT ANKLE COMPLETE - 3+ VIEW COMPARISON:  06/03/2014 FINDINGS: There is no evidence of fracture, dislocation, or joint effusion. There is no evidence of arthropathy or other focal bone abnormality. Soft tissues are unremarkable. IMPRESSION: Negative. Electronically Signed   By: Elberta Fortis M.D.   On: 03/03/2018 13:18   Dg Shoulder Left  Result Date: 03/03/2018 CLINICAL DATA:  Left shoulder pain post fall. EXAM: LEFT SHOULDER - 2+ VIEW COMPARISON:  None. FINDINGS: There is no evidence of fracture or dislocation. There is no evidence of arthropathy or other focal bone abnormality. Soft tissues are unremarkable. IMPRESSION: Negative. Electronically Signed   By: Ted Mcalpine M.D.   On: 03/03/2018 13:16   Dg Hips Bilat W Or Wo Pelvis 3-4 Views  Result Date: 03/03/2018 CLINICAL DATA:  Fall today with bilateral hip pain left worse than right. EXAM: DG HIP (WITH OR WITHOUT PELVIS) 3-4V BILAT COMPARISON:  07/28/2012 FINDINGS: Mild symmetric degenerative change of the hips. No acute fracture or dislocation. Mild degenerate change of the spine. IMPRESSION: No acute findings. Electronically Signed   By: Elberta Fortis M.D.   On: 03/03/2018 13:18    Procedures Procedures (including critical care time)  Medications Ordered in ED Medications  lidocaine  (LIDODERM) 5 % 1 patch (1 patch Transdermal Patch Applied 03/03/18 1144)  acetaminophen (TYLENOL) tablet 650 mg (650 mg Oral Given 03/03/18 1144)  Initial Impression / Assessment and Plan / ED Course  I have reviewed the triage vital signs and the nursing notes.  Pertinent labs & imaging results that were available during my care of the patient were reviewed by me and considered in my medical decision making (see chart for details).  Clinical Course as of Mar 03 1526  Mon Mar 03, 2018  [id number] 73 year old female status post fall a week ago here with continued pain on the left side of her body including her left hip.  She has been ambulatory since the fall.  Her x-rays are negative.  We recommended to her to follow-up with her primary care doctor for further evaluation and possible physical therapy.   [MB]    Clinical Course User Index [MB] Terrilee FilesButler, Michael C, MD    Patient is well-appearing and neurovascularly intact in upper and lower extremities.  Patient with fall 1 week ago with continued discomfort.  By history, appears mechanical with no evidence of syncope.  Patient has been ambulating without difficulty.  Radiographs of left shoulder, left hip, and left ankle in emergency department are negative for any acute findings.  Patient is able to walk, and was requesting discharge.  Do not feel that patient requires further imaging with CT scan to cover any further fractures today.  I discussed with the patient that she may benefit from physical therapy, and ambulatory referral placed.  Recommended follow-up with primary care provider.  Return precautions were given for any increasing pain, inability to walk, weakness or numbness in extremities.  Patient is in understanding and agrees with the plan of care.  This is a shared visit with Dr. Meridee ScoreMichael Butler. Patient was independently evaluated by this attending physician. Attending physician consulted in evaluation and discharge management.  Final  Clinical Impressions(s) / ED Diagnoses   Final diagnoses:  Fall, initial encounter  Left hip pain    ED Discharge Orders         Ordered    Ambulatory referral to Physical Therapy     03/03/18 1427    lidocaine (LIDODERM) 5 %  Every 24 hours     03/03/18 1437           Delia ChimesMurray, Dayrin Stallone B, PA-C 03/03/18 1540    Terrilee FilesButler, Michael C, MD 03/03/18 1746

## 2018-03-03 NOTE — ED Notes (Signed)
Patient verbalizes understanding of discharge instructions. Opportunity for questioning and answers were provided. Armband removed by staff, pt discharged from ED. Pt wheeled to lobby. 

## 2018-03-03 NOTE — ED Notes (Signed)
ED Provider at bedside. 

## 2018-03-03 NOTE — ED Notes (Signed)
Pt. returned from XR. 

## 2018-05-23 ENCOUNTER — Other Ambulatory Visit: Payer: Self-pay

## 2018-05-23 ENCOUNTER — Inpatient Hospital Stay (HOSPITAL_COMMUNITY)
Admission: EM | Admit: 2018-05-23 | Discharge: 2018-05-28 | DRG: 177 | Disposition: A | Discharge status: Home Health | Payer: Medicare Other | Attending: Internal Medicine | Admitting: Internal Medicine

## 2018-05-23 ENCOUNTER — Ambulatory Visit (HOSPITAL_COMMUNITY)
Admission: EM | Admit: 2018-05-23 | Discharge: 2018-05-23 | Disposition: A | Discharge status: Home / Self Care | Payer: Medicare Other | Source: Home / Self Care

## 2018-05-23 ENCOUNTER — Emergency Department (HOSPITAL_COMMUNITY): Payer: Medicare Other

## 2018-05-23 DIAGNOSIS — Z885 Allergy status to narcotic agent status: Secondary | ICD-10-CM

## 2018-05-23 DIAGNOSIS — J1289 Other viral pneumonia: Secondary | ICD-10-CM | POA: Diagnosis present

## 2018-05-23 DIAGNOSIS — R32 Unspecified urinary incontinence: Secondary | ICD-10-CM | POA: Diagnosis present

## 2018-05-23 DIAGNOSIS — Z886 Allergy status to analgesic agent status: Secondary | ICD-10-CM | POA: Diagnosis not present

## 2018-05-23 DIAGNOSIS — G8929 Other chronic pain: Secondary | ICD-10-CM | POA: Diagnosis present

## 2018-05-23 DIAGNOSIS — U071 COVID-19: Secondary | ICD-10-CM | POA: Diagnosis present

## 2018-05-23 DIAGNOSIS — I951 Orthostatic hypotension: Secondary | ICD-10-CM | POA: Diagnosis present

## 2018-05-23 DIAGNOSIS — M150 Primary generalized (osteo)arthritis: Secondary | ICD-10-CM | POA: Diagnosis present

## 2018-05-23 DIAGNOSIS — R531 Weakness: Secondary | ICD-10-CM | POA: Diagnosis not present

## 2018-05-23 DIAGNOSIS — R739 Hyperglycemia, unspecified: Secondary | ICD-10-CM | POA: Diagnosis not present

## 2018-05-23 DIAGNOSIS — I10 Essential (primary) hypertension: Secondary | ICD-10-CM | POA: Diagnosis present

## 2018-05-23 DIAGNOSIS — M545 Low back pain: Secondary | ICD-10-CM | POA: Diagnosis present

## 2018-05-23 DIAGNOSIS — Z8673 Personal history of transient ischemic attack (TIA), and cerebral infarction without residual deficits: Secondary | ICD-10-CM | POA: Diagnosis not present

## 2018-05-23 DIAGNOSIS — M4802 Spinal stenosis, cervical region: Secondary | ICD-10-CM | POA: Diagnosis present

## 2018-05-23 DIAGNOSIS — J069 Acute upper respiratory infection, unspecified: Secondary | ICD-10-CM | POA: Diagnosis not present

## 2018-05-23 DIAGNOSIS — E876 Hypokalemia: Secondary | ICD-10-CM | POA: Diagnosis present

## 2018-05-23 DIAGNOSIS — I252 Old myocardial infarction: Secondary | ICD-10-CM

## 2018-05-23 DIAGNOSIS — E1165 Type 2 diabetes mellitus with hyperglycemia: Secondary | ICD-10-CM | POA: Diagnosis present

## 2018-05-23 DIAGNOSIS — E871 Hypo-osmolality and hyponatremia: Secondary | ICD-10-CM | POA: Diagnosis present

## 2018-05-23 DIAGNOSIS — Z79899 Other long term (current) drug therapy: Secondary | ICD-10-CM | POA: Diagnosis not present

## 2018-05-23 DIAGNOSIS — E1169 Type 2 diabetes mellitus with other specified complication: Secondary | ICD-10-CM

## 2018-05-23 DIAGNOSIS — Z888 Allergy status to other drugs, medicaments and biological substances status: Secondary | ICD-10-CM

## 2018-05-23 DIAGNOSIS — I071 Rheumatic tricuspid insufficiency: Secondary | ICD-10-CM | POA: Diagnosis not present

## 2018-05-23 DIAGNOSIS — E119 Type 2 diabetes mellitus without complications: Secondary | ICD-10-CM

## 2018-05-23 LAB — CBC
HCT: 38.6 % (ref 36.0–46.0)
Hemoglobin: 12.4 g/dL (ref 12.0–15.0)
MCH: 29.2 pg (ref 26.0–34.0)
MCHC: 32.1 g/dL (ref 30.0–36.0)
MCV: 91 fL (ref 80.0–100.0)
Platelets: 208 10*3/uL (ref 150–400)
RBC: 4.24 MIL/uL (ref 3.87–5.11)
RDW: 11.9 % (ref 11.5–15.5)
WBC: 5.3 10*3/uL (ref 4.0–10.5)
nRBC: 0 % (ref 0.0–0.2)

## 2018-05-23 LAB — BASIC METABOLIC PANEL
Anion gap: 16 — ABNORMAL HIGH (ref 5–15)
BUN: 15 mg/dL (ref 8–23)
CO2: 34 mmol/L — ABNORMAL HIGH (ref 22–32)
Calcium: 9.5 mg/dL (ref 8.9–10.3)
Chloride: 83 mmol/L — ABNORMAL LOW (ref 98–111)
Creatinine, Ser: 1.3 mg/dL — ABNORMAL HIGH (ref 0.44–1.00)
GFR calc Af Amer: 48 mL/min — ABNORMAL LOW (ref >60)
GFR calc non Af Amer: 41 mL/min — ABNORMAL LOW (ref >60)
Glucose, Bld: 435 mg/dL — ABNORMAL HIGH (ref 70–99)
Potassium: 2.9 mmol/L — ABNORMAL LOW (ref 3.5–5.1)
Sodium: 133 mmol/L — ABNORMAL LOW (ref 135–145)

## 2018-05-23 LAB — TROPONIN I: Troponin I: 0.03 ng/mL (ref <0.03)

## 2018-05-23 LAB — URINALYSIS, ROUTINE W REFLEX MICROSCOPIC
Bilirubin Urine: NEGATIVE
Glucose, UA: >=500 mg/dL — AB
Hgb urine dipstick: NEGATIVE
Ketones, ur: NEGATIVE mg/dL
Leukocytes,Ua: NEGATIVE
Nitrite: NEGATIVE
Protein, ur: NEGATIVE mg/dL
Specific Gravity, Urine: 1.023 (ref 1.005–1.030)
pH: 7 (ref 5.0–8.0)

## 2018-05-23 LAB — SARS CORONAVIRUS 2 BY RT PCR (HOSPITAL ORDER, PERFORMED IN ~~LOC~~ HOSPITAL LAB): SARS Coronavirus 2: POSITIVE — AB

## 2018-05-23 LAB — MAGNESIUM: Magnesium: 1.9 mg/dL (ref 1.7–2.4)

## 2018-05-23 LAB — BRAIN NATRIURETIC PEPTIDE: B Natriuretic Peptide: 39.8 pg/mL (ref 0.0–100.0)

## 2018-05-23 MED ORDER — DOXYCYCLINE HYCLATE 100 MG PO TABS
100.0000 mg | ORAL_TABLET | Freq: Once | ORAL | Status: AC
  Administered 2018-05-23: 100 mg via ORAL
  Filled 2018-05-23: qty 1

## 2018-05-23 MED ORDER — CLONIDINE HCL 0.1 MG PO TABS
0.2000 mg | ORAL_TABLET | Freq: Two times a day (BID) | ORAL | Status: DC
  Administered 2018-05-24 – 2018-05-25 (×4): 0.2 mg via ORAL
  Filled 2018-05-23 (×4): qty 2

## 2018-05-23 MED ORDER — HYDRALAZINE HCL 50 MG PO TABS
25.0000 mg | ORAL_TABLET | Freq: Three times a day (TID) | ORAL | Status: DC
  Administered 2018-05-24 – 2018-05-28 (×13): 25 mg via ORAL
  Filled 2018-05-23 (×13): qty 1

## 2018-05-23 MED ORDER — AMLODIPINE BESYLATE 5 MG PO TABS
10.0000 mg | ORAL_TABLET | Freq: Every day | ORAL | Status: DC
  Administered 2018-05-24 – 2018-05-25 (×2): 10 mg via ORAL
  Filled 2018-05-23 (×2): qty 2

## 2018-05-23 MED ORDER — MIRABEGRON ER 25 MG PO TB24
25.0000 mg | ORAL_TABLET | Freq: Every day | ORAL | Status: DC
  Administered 2018-05-24 – 2018-05-28 (×5): 25 mg via ORAL
  Filled 2018-05-23 (×7): qty 1

## 2018-05-23 MED ORDER — ENOXAPARIN SODIUM 40 MG/0.4ML ~~LOC~~ SOLN
40.0000 mg | SUBCUTANEOUS | Status: DC
  Administered 2018-05-24 – 2018-05-28 (×5): 40 mg via SUBCUTANEOUS
  Filled 2018-05-23 (×5): qty 0.4

## 2018-05-23 MED ORDER — POTASSIUM CHLORIDE CRYS ER 20 MEQ PO TBCR
40.0000 meq | EXTENDED_RELEASE_TABLET | Freq: Once | ORAL | Status: AC
  Administered 2018-05-23: 40 meq via ORAL
  Filled 2018-05-23: qty 2

## 2018-05-23 MED ORDER — SODIUM CHLORIDE 0.9 % IV BOLUS
500.0000 mL | Freq: Once | INTRAVENOUS | Status: AC
  Administered 2018-05-23: 500 mL via INTRAVENOUS

## 2018-05-23 MED ORDER — INSULIN ASPART 100 UNIT/ML ~~LOC~~ SOLN
0.0000 [IU] | Freq: Every day | SUBCUTANEOUS | Status: DC
  Administered 2018-05-24: 3 [IU] via SUBCUTANEOUS

## 2018-05-23 MED ORDER — INSULIN ASPART 100 UNIT/ML ~~LOC~~ SOLN
0.0000 [IU] | Freq: Three times a day (TID) | SUBCUTANEOUS | Status: DC
  Administered 2018-05-24: 3 [IU] via SUBCUTANEOUS
  Administered 2018-05-24 – 2018-05-25 (×2): 8 [IU] via SUBCUTANEOUS
  Administered 2018-05-25: 3 [IU] via SUBCUTANEOUS
  Administered 2018-05-26: 5 [IU] via SUBCUTANEOUS
  Administered 2018-05-26: 2 [IU] via SUBCUTANEOUS
  Administered 2018-05-26: 3 [IU] via SUBCUTANEOUS
  Administered 2018-05-27: 5 [IU] via SUBCUTANEOUS
  Administered 2018-05-27: 8 [IU] via SUBCUTANEOUS
  Administered 2018-05-28 (×2): 3 [IU] via SUBCUTANEOUS

## 2018-05-23 MED ORDER — ALBUTEROL SULFATE HFA 108 (90 BASE) MCG/ACT IN AERS
2.0000 | INHALATION_SPRAY | RESPIRATORY_TRACT | Status: DC | PRN
  Administered 2018-05-25: 2 via RESPIRATORY_TRACT
  Filled 2018-05-23: qty 6.7

## 2018-05-23 MED ORDER — SODIUM CHLORIDE 0.9 % IV SOLN
1.0000 g | Freq: Once | INTRAVENOUS | Status: AC
  Administered 2018-05-23: 1 g via INTRAVENOUS
  Filled 2018-05-23: qty 10

## 2018-05-23 MED ORDER — GUAIFENESIN-DM 100-10 MG/5ML PO SYRP
10.0000 mL | ORAL_SOLUTION | ORAL | Status: DC | PRN
  Administered 2018-05-24 – 2018-05-27 (×10): 10 mL via ORAL
  Filled 2018-05-23 (×11): qty 10

## 2018-05-23 MED ORDER — BENZONATATE 100 MG PO CAPS
100.0000 mg | ORAL_CAPSULE | Freq: Once | ORAL | Status: AC
  Administered 2018-05-23: 22:00:00 100 mg via ORAL
  Filled 2018-05-23: qty 1

## 2018-05-23 NOTE — ED Triage Notes (Addendum)
Pt reports productive cough with thick white mucus x 1 week. Pt reports weakness in both legs, headache, and sore throat, generalized body aches. Pt denies SHOB,  fever or sick contact.

## 2018-05-23 NOTE — ED Notes (Signed)
Spoke with patient and family.  Patient complains of cough, urinary incontinence, and repeatedly says she feels weak and like passing out when standing.  Discussed with Dr. Delton See.  Discussed complaints and co-morbidities, and limitations of available testing in urgent care setting with patient's 3 daughters.  Family taking patient to the ED

## 2018-05-23 NOTE — ED Provider Notes (Signed)
[facility] Provider Note   CSN: [id number] Arrival date & time: 05/23/18  1734    History   Chief Complaint Chief Complaint  Patient presents with  . Cough  . Weakness    HPI [name] Mervine is a 73 y.o. female.     The history is provided by the patient and medical records. No language interpreter was used.  Cough  Weakness  Associated symptoms: cough    [name] [name] is a 73 y.o. female who presents to the Emergency Department complaining of cough.  She complains of one week of cough with progressive urinary incontinence due to coughing.  Cough is productive of white sputum.  She has pain in her chest when she coughs hard.  Denies fevers, vomiting, diarrhea.  She has abdominal soreness with coughing.  She complains of severe generalized weakness.  She feels as if she will pass out with standing.  No known exposures to coronavirus.     Past Medical History:  Diagnosis Date  . Diabetes mellitus without complication (HCC)   . GERD 09/04/2006   Qualifier: Diagnosis of  By: Duke SalviaGore, Denise    . History of echocardiogram    a. 2D ECHO: 11/06/2013 EF 65%; no WMA. Mild TR. PA pk pressure 43 mm HG  . Hypertension   . Stroke [facility](HCC)     Patient Active Problem List   Diagnosis Date Noted  . NSTEMI (non-ST elevated myocardial infarction) (HCC) 11/05/2013  . Spinal stenosis of cervical region 10/20/2012  . Prolonged QT interval 10/18/2012  . Left sided numbness 10/18/2012  . Palpitations 10/18/2012  . 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 Numbness 06/20/2012  . Diabetes mellitus (HCC) 06/20/2012  . CVA (cerebral infarction) 06/20/2012  . Hypokalemia 06/20/2012  . ARTHRALGIA 01/28/2009  . CHEST PAIN UNSPECIFIED 11/26/2008  . DEPRESSION 09/04/2006  . Essential hypertension 09/04/2006  . GERD 09/04/2006  . MULTIPLE SITES, ARTHRITIS-ACUTE/CHRONIC 09/04/2006  . LOW BACK PAIN, CHRONIC 09/04/2006    Past Surgical History:   Procedure Laterality Date  . ABDOMINAL HYSTERECTOMY    . LEFT HEART CATHETERIZATION WITH CORONARY ANGIOGRAM N/A 11/05/2013   Procedure: LEFT HEART CATHETERIZATION WITH CORONARY ANGIOGRAM;  Surgeon: Lennette Biharihomas A Kelly, MD;  [facility];  Service: Cardiovascular;  Laterality: N/A;     OB History   No obstetric history on file.      Home Medications    Prior to Admission medications   Medication Sig Start Date End Date Taking? Authorizing Provider  amLODipine (NORVASC) 10 MG tablet Take 10 mg by mouth daily.   Yes [provider]  chlorthalidone (HYGROTON) 25 MG tablet Take 25 mg by mouth daily. 01/31/18  Yes [provider]  cloNIDine (CATAPRES) 0.2 MG tablet Take 0.2 mg by mouth 2 (two) times daily.   Yes [provider]  hydrALAZINE (APRESOLINE) 25 MG tablet Take 1 tablet (25 mg total) by mouth 3 (three) times daily. 02/10/18  Yes Pfeiffer, Lebron ConnersMarcy, MD  MYRBETRIQ 25 MG TB24 tablet Take 25 mg by mouth daily. 02/03/18  Yes [provider]  potassium chloride (K-DUR) 10 MEQ tablet Take 1 tablet (10 mEq total) by mouth daily. 05/28/17  Yes Linwood DibblesKnapp, Jon, MD  VENTOLIN HFA [phone](90 Base) MCG/ACT inhaler Inhale 2 puffs into the lungs every 6 (six) hours as needed for shortness of breath. 01/31/18  Yes [provider]  acetaminophen (TYLENOL) 500 MG tablet Take 2 tablets (1,000 mg total) by mouth every 6 (six) hours as  needed. Patient not taking: Reported on 05/23/2018 02/10/18   Arby Barrette, MD  amLODipine (NORVASC) 5 MG tablet Take 1 tablet (5 mg total) by mouth daily. Patient not taking: Reported on 05/23/2018 11/06/13   Janetta Hora, PA-C  atorvastatin (LIPITOR) 10 MG tablet Take 1 tablet (10 mg total) by mouth daily at 6 PM. Patient not taking: Reported on 05/23/2018 06/27/12   Angiulli, Mcarthur Rossetti, PA-C  cyclobenzaprine (FLEXERIL) 10 MG tablet Take 1 tablet (10 mg total) by mouth 3 (three) times daily as needed for muscle spasms. Patient not taking:  Reported on 05/23/2018 04/18/17   Azalia Bilis, MD  furosemide (LASIX) 20 MG tablet Take 1 tablet (20 mg total) by mouth daily. Patient not taking: Reported on 05/23/2018 05/28/17   Linwood Dibbles, MD  gabapentin (NEURONTIN) 300 MG capsule Take 300 mg by mouth every evening. Reported on 04/22/2015 03/29/15   [provider]  lidocaine (LIDODERM) 5 % Place 1 patch onto the skin daily. Remove & Discard patch within 12 hours or as directed by MD Patient not taking: Reported on 05/23/2018 03/03/18   Aviva Kluver B, PA-C  methocarbamol (ROBAXIN) 500 MG tablet Take 1-2 tablets (500-1,000 mg total) by mouth every 8 (eight) hours as needed for muscle spasms (and pain). Patient not taking: Reported on 04/15/2017 03/24/15   Trixie Dredge, PA-C  methocarbamol (ROBAXIN) 500 MG tablet Take 1 tablet (500 mg total) by mouth every 8 (eight) hours as needed for muscle spasms. Patient not taking: Reported on 04/15/2017 03/28/15   Mackuen, Courteney Lyn, MD  methocarbamol (ROBAXIN) 500 MG tablet Take 1 tablet (500 mg total) by mouth every 8 (eight) hours as needed for muscle spasms. Patient not taking: Reported on 05/23/2018 02/10/18   Arby Barrette, MD  nitroGLYCERIN (NITROSTAT) 0.4 MG SL tablet Place 1 tablet (0.4 mg total) under the tongue every 5 (five) minutes x 3 doses as needed for chest pain. Patient not taking: Reported on 05/23/2018 11/06/13   Janetta Hora, PA-C    Family History No family history on file.  Social History Social History   Tobacco Use  . Smoking status: Never Smoker  . Smokeless tobacco: Never Used  Substance Use Topics  . Alcohol use: No  . Drug use: No     Allergies   Codeine; Ibuprofen; and Imdur [isosorbide nitrate]   Review of Systems Review of Systems  Respiratory: Positive for cough.   Neurological: Positive for weakness.  All other systems reviewed and are negative.    Physical Exam Updated Vital Signs BP (!) 161/82   Pulse 80   Temp 99 F (37.2 C) (Oral)   Resp  16   SpO2 98%   Physical Exam Vitals signs and nursing note reviewed.  Constitutional:      Appearance: She is well-developed.  HENT:     Head: Normocephalic and atraumatic.  Cardiovascular:     Rate and Rhythm: Normal rate and regular rhythm.     Heart sounds: No murmur.  Pulmonary:     Effort: Pulmonary effort is normal. No respiratory distress.     Breath sounds: Normal breath sounds.     Comments: Frequent coughing Abdominal:     Palpations: Abdomen is soft.     Tenderness: There is no abdominal tenderness. There is no guarding or rebound.  Musculoskeletal:        General: No swelling or tenderness.  Skin:    General: Skin is warm and dry.     Capillary Refill: Capillary  refill takes less than 2 seconds.  Neurological:     Mental Status: She is alert and oriented to person, place, and time.     Comments: Generalized weakness  Psychiatric:        Behavior: Behavior normal.      ED Treatments / Results  Labs (all labs ordered are listed, but only abnormal results are displayed) Labs Reviewed  SARS CORONAVIRUS 2 (HOSPITAL ORDER, PERFORMED IN [facility]) - Abnormal; Notable for the following components:      Result Value   SARS Coronavirus 2 POSITIVE (*)    All other components within normal limits  BASIC METABOLIC PANEL - Abnormal; Notable for the following components:   Sodium 133 (*)    Potassium 2.9 (*)    Chloride 83 (*)    CO2 34 (*)    Glucose, Bld 435 (*)    Creatinine, Ser 1.30 (*)    GFR calc non Af Amer 41 (*)    GFR calc Af Amer 48 (*)    Anion gap 16 (*)    All other components within normal limits  URINALYSIS, ROUTINE W REFLEX MICROSCOPIC - Abnormal; Notable for the following components:   Glucose, UA >=500 (*)    Bacteria, UA RARE (*)    All other components within normal limits  TROPONIN I - Abnormal; Notable for the following components:   Troponin I 0.03 (*)    All other components within normal limits  CBC  BRAIN NATRIURETIC  PEPTIDE  MAGNESIUM    EKG EKG Interpretation  Date/Time:  Friday May 23 2018 18:03:41 EDT Ventricular Rate:  82 PR Interval:  124 QRS Duration: 68 QT Interval:  372 QTC Calculation: 434 R Axis:   56 Text Interpretation:  Normal sinus rhythm T wave abnormality, consider inferior ischemia T wave abnormality, consider anterior ischemia Abnormal ECG Confirmed by Tilden Fossa [id number]) on 05/23/2018 8:37:07 PM   Radiology Dg Chest Port 1 View  Result Date: 05/23/2018 CLINICAL DATA:  Cough EXAM: PORTABLE CHEST 1 VIEW COMPARISON:  02/10/2018 chest radiograph. FINDINGS: Stable cardiomediastinal silhouette with normal heart size. No pneumothorax. No pleural effusion. New mild hazy opacity at the right costophrenic angle. No pulmonary edema. IMPRESSION: New mild hazy opacity at the right costophrenic angle, cannot exclude pneumonia. Chest radiograph follow-up advised. Electronically Signed   By: Delbert Phenix M.D.   On: 05/23/2018 21:40    Procedures Procedures (including critical care time)  Medications Ordered in ED Medications  insulin aspart (novoLOG) injection 0-15 Units (has no administration in time range)  insulin aspart (novoLOG) injection 0-5 Units (has no administration in time range)  potassium chloride SA (K-DUR) CR tablet 40 mEq (40 mEq Oral Given 05/23/18 2139)  benzonatate (TESSALON) capsule 100 mg (100 mg Oral Given 05/23/18 2139)  cefTRIAXone (ROCEPHIN) 1 g in sodium chloride 0.9 % 100 mL IVPB (1 g Intravenous New Bag/Given 05/23/18 2253)  doxycycline (VIBRA-TABS) tablet 100 mg (100 mg Oral Given 05/23/18 2252)  sodium chloride 0.9 % bolus 500 mL (500 mLs Intravenous New Bag/Given 05/23/18 2252)     Initial Impression / Assessment and Plan / ED Course  I have reviewed the triage vital signs and the nursing notes.  Pertinent labs & imaging results that were available during my care of the patient were reviewed by me and considered in my medical decision making (see chart for  details).        Patient here for cough, weakness.  Labs significant hypokalemia, hyperglcyemia.  CXR  with infiltrate and she was treated with abx for possible CAP.  Patient updated of findings of studies and recommendation for admission and she is in agreement with treatment plan.   COVID test positive.    Hospitalist consulted for admission for further treatment.    Nolita Kutter was evaluated in Emergency Department on 05/23/2018 for the symptoms described in the history of present illness. She was evaluated in the context of the global COVID-19 pandemic, which necessitated consideration that the patient might be at risk for infection with the SARS-CoV-2 virus that causes COVID-19. Institutional protocols and algorithms that pertain to the evaluation of patients at risk for COVID-19 are in a state of rapid change based on information released by regulatory bodies including the CDC and federal and state organizations. These policies and algorithms were followed during the patient's care in the ED.   Final Clinical Impressions(s) / ED Diagnoses   Final diagnoses:  COVID-19 virus infection  Hypokalemia  Hyperglycemia    ED Discharge Orders    None       Tilden Fossa, MD 05/23/18 2342

## 2018-05-24 ENCOUNTER — Encounter (HOSPITAL_COMMUNITY): Payer: Self-pay

## 2018-05-24 DIAGNOSIS — U071 COVID-19: Principal | ICD-10-CM

## 2018-05-24 DIAGNOSIS — J069 Acute upper respiratory infection, unspecified: Secondary | ICD-10-CM

## 2018-05-24 DIAGNOSIS — E876 Hypokalemia: Secondary | ICD-10-CM

## 2018-05-24 DIAGNOSIS — E119 Type 2 diabetes mellitus without complications: Secondary | ICD-10-CM

## 2018-05-24 DIAGNOSIS — R739 Hyperglycemia, unspecified: Secondary | ICD-10-CM

## 2018-05-24 DIAGNOSIS — I10 Essential (primary) hypertension: Secondary | ICD-10-CM

## 2018-05-24 LAB — BASIC METABOLIC PANEL
Anion gap: 10 (ref 5–15)
BUN: 16 mg/dL (ref 8–23)
CO2: 32 mmol/L (ref 22–32)
Calcium: 8.2 mg/dL — ABNORMAL LOW (ref 8.9–10.3)
Chloride: 95 mmol/L — ABNORMAL LOW (ref 98–111)
Creatinine, Ser: 1.09 mg/dL — ABNORMAL HIGH (ref 0.44–1.00)
GFR calc Af Amer: 59 mL/min — ABNORMAL LOW (ref >60)
GFR calc non Af Amer: 51 mL/min — ABNORMAL LOW (ref >60)
Glucose, Bld: 318 mg/dL — ABNORMAL HIGH (ref 70–99)
Potassium: 2.8 mmol/L — ABNORMAL LOW (ref 3.5–5.1)
Sodium: 137 mmol/L (ref 135–145)

## 2018-05-24 LAB — GLUCOSE, CAPILLARY
Glucose-Capillary: 259 mg/dL — ABNORMAL HIGH (ref 70–99)
Glucose-Capillary: 197 mg/dL — ABNORMAL HIGH (ref 70–99)
Glucose-Capillary: 263 mg/dL — ABNORMAL HIGH (ref 70–99)
Glucose-Capillary: 117 mg/dL — ABNORMAL HIGH (ref 70–99)
Glucose-Capillary: 169 mg/dL — ABNORMAL HIGH (ref 70–99)

## 2018-05-24 LAB — CBC WITH DIFFERENTIAL/PLATELET
Abs Immature Granulocytes: 0.02 10*3/uL (ref 0.00–0.07)
Basophils Absolute: 0 10*3/uL (ref 0.0–0.1)
Basophils Relative: 0 %
Eosinophils Absolute: 0 10*3/uL (ref 0.0–0.5)
Eosinophils Relative: 0 %
HCT: 34.8 % — ABNORMAL LOW (ref 36.0–46.0)
Hemoglobin: 11.1 g/dL — ABNORMAL LOW (ref 12.0–15.0)
Immature Granulocytes: 0 %
Lymphocytes Relative: 21 %
Lymphs Abs: 1.4 10*3/uL (ref 0.7–4.0)
MCH: 29.6 pg (ref 26.0–34.0)
MCHC: 31.9 g/dL (ref 30.0–36.0)
MCV: 92.8 fL (ref 80.0–100.0)
Monocytes Absolute: 0.4 10*3/uL (ref 0.1–1.0)
Monocytes Relative: 7 %
Neutro Abs: 4.5 10*3/uL (ref 1.7–7.7)
Neutrophils Relative %: 72 %
Platelets: 192 10*3/uL (ref 150–400)
RBC: 3.75 MIL/uL — ABNORMAL LOW (ref 3.87–5.11)
RDW: 12.2 % (ref 11.5–15.5)
WBC: 6.3 10*3/uL (ref 4.0–10.5)
nRBC: 0 % (ref 0.0–0.2)

## 2018-05-24 LAB — D-DIMER, QUANTITATIVE: D-Dimer, Quant: 0.96 ug/mL-FEU — ABNORMAL HIGH (ref 0.00–0.50)

## 2018-05-24 LAB — ABO/RH: ABO/RH(D): B POS

## 2018-05-24 LAB — PROCALCITONIN: Procalcitonin: <0.1 ng/mL

## 2018-05-24 LAB — C-REACTIVE PROTEIN: CRP: 11.6 mg/dL — ABNORMAL HIGH (ref <1.0)

## 2018-05-24 LAB — FERRITIN: Ferritin: 154 ng/mL (ref 11–307)

## 2018-05-24 MED ORDER — POTASSIUM CHLORIDE 10 MEQ/100ML IV SOLN
10.0000 meq | INTRAVENOUS | Status: AC
  Administered 2018-05-24 (×3): 10 meq via INTRAVENOUS
  Filled 2018-05-24 (×2): qty 100

## 2018-05-24 MED ORDER — FAMOTIDINE 20 MG PO TABS
20.0000 mg | ORAL_TABLET | Freq: Every day | ORAL | Status: DC
  Administered 2018-05-24 – 2018-05-28 (×5): 20 mg via ORAL
  Filled 2018-05-24 (×5): qty 1

## 2018-05-24 MED ORDER — LIDOCAINE 5 % EX PTCH
1.0000 | MEDICATED_PATCH | CUTANEOUS | Status: DC
  Administered 2018-05-24 – 2018-05-27 (×4): 1 via TRANSDERMAL
  Filled 2018-05-24 (×6): qty 1

## 2018-05-24 MED ORDER — POTASSIUM CHLORIDE CRYS ER 20 MEQ PO TBCR
40.0000 meq | EXTENDED_RELEASE_TABLET | ORAL | Status: AC
  Administered 2018-05-24 (×2): 40 meq via ORAL
  Filled 2018-05-24 (×2): qty 2

## 2018-05-24 MED ORDER — VITAMIN C 500 MG PO TABS
500.0000 mg | ORAL_TABLET | Freq: Two times a day (BID) | ORAL | Status: DC
  Administered 2018-05-24 – 2018-05-28 (×9): 500 mg via ORAL
  Filled 2018-05-24 (×8): qty 1

## 2018-05-24 MED ORDER — SODIUM CHLORIDE 0.9 % IV SOLN
INTRAVENOUS | Status: DC
  Administered 2018-05-24 – 2018-05-25 (×3): via INTRAVENOUS

## 2018-05-24 MED ORDER — ZINC SULFATE 220 (50 ZN) MG PO CAPS
220.0000 mg | ORAL_CAPSULE | Freq: Every day | ORAL | Status: DC
  Administered 2018-05-24 – 2018-05-28 (×5): 220 mg via ORAL
  Filled 2018-05-24 (×5): qty 1

## 2018-05-24 NOTE — Progress Notes (Signed)
Pt requested lidocaine pt for lower back. Pt had a fall a couple weeks ago and states this relieves the discomfort. notified MD

## 2018-05-24 NOTE — Progress Notes (Signed)
MD returned page and gave verbal order to order lidocaine patch for patient. Order placed. Will continue to monitor patient.

## 2018-05-24 NOTE — H&P (Signed)
History and Physical    [name] ZOX:[id number] DOB: 12/20/1945 DOA: 05/23/2018  PCP: Patient, No Pcp Per  Patient coming from: Home  I have personally briefly reviewed [facility]  Chief Complaint: Cough  HPI: [name] is a 73 y.o. female with medical history significant of DM2, HTN, stroke.  Patient presents tot he ED with c/o cough for past 1 week.  Progressive urinary incontinence with coughing.  Cough productive of white sputum.  CP when she coughs hard.  Denies fevers, vomiting, diarrhea.  Reports severe generalized weakness.  No known sick exposures.   ED Course: Initially treated with rocephin / doxy after CXR suspicious for PNA at R costophrenic angle.  WBC nl.  Tm 99.0.  No other SIRS.  Creat 1.3 (1.0 baseline).  CBG in the 400s (looks like DM is usually diet controlled only).  COVID-19 does return positive.  Not requiring O2 at this point though.   Review of Systems: As per HPI otherwise 10 point review of systems negative.   Past Medical History:  Diagnosis Date  . Diabetes mellitus without complication (HCC)   . GERD 09/04/2006   Qualifier: Diagnosis of  By: Duke SalviaGore, Denise    . History of echocardiogram    a. 2D ECHO: 11/06/2013 EF 65%; no WMA. Mild TR. PA pk pressure 43 mm HG  . Hypertension   . Stroke [facility](HCC)     Past Surgical History:  Procedure Laterality Date  . ABDOMINAL HYSTERECTOMY    . LEFT HEART CATHETERIZATION WITH CORONARY ANGIOGRAM N/A 11/05/2013   Procedure: LEFT HEART CATHETERIZATION WITH CORONARY ANGIOGRAM;  Surgeon: Lennette Biharihomas A Kelly, MD;  [facility];  Service: Cardiovascular;  Laterality: N/A;     reports that she has never smoked. She has never used smokeless tobacco. She reports that she does not drink alcohol or use drugs.  Allergies  Allergen Reactions  . Codeine Other (See Comments)    REACTION: Hallucinations  . Ibuprofen Other (See Comments)    REACTION: Elevated Blood Pressure  .  Imdur [Isosorbide Nitrate] Other (See Comments)    Headache & upset stomach    No family history on file. No sick contacts.  Prior to Admission medications   Medication Sig Start Date End Date Taking? Authorizing Provider  amLODipine (NORVASC) 10 MG tablet Take 10 mg by mouth daily.   Yes [provider]  chlorthalidone (HYGROTON) 25 MG tablet Take 25 mg by mouth daily. 01/31/18  Yes [provider]  cloNIDine (CATAPRES) 0.2 MG tablet Take 0.2 mg by mouth 2 (two) times daily.   Yes [provider]  hydrALAZINE (APRESOLINE) 25 MG tablet Take 1 tablet (25 mg total) by mouth 3 (three) times daily. 02/10/18  Yes Pfeiffer, Lebron ConnersMarcy, MD  MYRBETRIQ 25 MG TB24 tablet Take 25 mg by mouth daily. 02/03/18  Yes [provider]  potassium chloride (K-DUR) 10 MEQ tablet Take 1 tablet (10 mEq total) by mouth daily. 05/28/17  Yes Linwood DibblesKnapp, Jon, MD  VENTOLIN HFA 108 [phone](90 Base) MCG/ACT inhaler Inhale 2 puffs into the lungs every 6 (six) hours as needed for shortness of breath. 01/31/18  Yes [provider]    Physical Exam: Vitals:   05/23/18 1757 05/23/18 2156  BP: (!) 146/85 (!) 161/82  Pulse: 83 80  Resp: 20 16  Temp: 99 F (37.2 C)   TempSrc: Oral   SpO2: 99% 98%    Constitutional: NAD, calm, comfortable Eyes: PERRL, lids and conjunctivae normal  ENMT: Mucous membranes are moist. Posterior pharynx clear of any exudate or lesions.Normal dentition.  Neck: normal, supple, no masses, no thyromegaly Respiratory: clear to auscultation bilaterally, no wheezing, no crackles. Normal respiratory effort. No accessory muscle use.  Cardiovascular: Regular rate and rhythm, no murmurs / rubs / gallops. No extremity edema. 2+ pedal pulses. No carotid bruits.  Abdomen: no tenderness, no masses palpated. No hepatosplenomegaly. Bowel sounds positive.  Musculoskeletal: no clubbing / cyanosis. No joint deformity upper and lower extremities. Good ROM, no contractures. Normal muscle  tone.  Skin: no rashes, lesions, ulcers. No induration Neurologic: CN 2-12 grossly intact. Sensation intact, DTR normal. Strength 5/5 in all 4.  Psychiatric: Normal judgment and insight. Alert and oriented x 3. Normal mood.    Labs on Admission: I have personally reviewed following labs and imaging studies  CBC: Recent Labs  Lab 05/23/18 1806  WBC 5.3  HGB 12.4  HCT 38.6  MCV 91.0  PLT 208   Basic Metabolic Panel: Recent Labs  Lab 05/23/18 1806 05/23/18 2155  NA 133*  --   K 2.9*  --   CL 83*  --   CO2 34*  --   GLUCOSE 435*  --   BUN 15  --   CREATININE 1.30*  --   CALCIUM 9.5  --   MG  --  1.9   GFR: CrCl cannot be calculated (Unknown ideal weight.). Liver Function Tests: No results for input(s): AST, ALT, ALKPHOS, BILITOT, PROT, ALBUMIN in the last 168 hours. No results for input(s): LIPASE, AMYLASE in the last 168 hours. No results for input(s): AMMONIA in the last 168 hours. Coagulation Profile: No results for input(s): INR, PROTIME in the last 168 hours. Cardiac Enzymes: Recent Labs  Lab 05/23/18 2155  TROPONINI 0.03*   BNP (last 3 results) No results for input(s): PROBNP in the last 8760 hours. HbA1C: No results for input(s): HGBA1C in the last 72 hours. CBG: No results for input(s): GLUCAP in the last 168 hours. Lipid Profile: No results for input(s): CHOL, HDL, LDLCALC, TRIG, CHOLHDL, LDLDIRECT in the last 72 hours. Thyroid Function Tests: No results for input(s): TSH, T4TOTAL, FREET4, T3FREE, THYROIDAB in the last 72 hours. Anemia Panel: No results for input(s): VITAMINB12, FOLATE, FERRITIN, TIBC, IRON, RETICCTPCT in the last 72 hours. Urine analysis:    Component Value Date/Time   COLORURINE YELLOW 05/23/2018 2013   APPEARANCEUR CLEAR 05/23/2018 2013   LABSPEC 1.023 05/23/2018 2013   PHURINE 7.0 05/23/2018 2013   GLUCOSEU >=500 (A) 05/23/2018 2013   HGBUR NEGATIVE 05/23/2018 2013   BILIRUBINUR NEGATIVE 05/23/2018 2013   KETONESUR  NEGATIVE 05/23/2018 2013   PROTEINUR NEGATIVE 05/23/2018 2013   UROBILINOGEN 0.2 12/14/2013 2035   NITRITE NEGATIVE 05/23/2018 2013   LEUKOCYTESUR NEGATIVE 05/23/2018 2013    Radiological Exams on Admission: Dg Chest Port 1 View  Result Date: 05/23/2018 CLINICAL DATA:  Cough EXAM: PORTABLE CHEST 1 VIEW COMPARISON:  02/10/2018 chest radiograph. FINDINGS: Stable cardiomediastinal silhouette with normal heart size. No pneumothorax. No pleural effusion. New mild hazy opacity at the right costophrenic angle. No pulmonary edema. IMPRESSION: New mild hazy opacity at the right costophrenic angle, cannot exclude pneumonia. Chest radiograph follow-up advised. Electronically Signed   By: Delbert Phenix M.D.   On: 05/23/2018 21:40    EKG: Independently reviewed.  Assessment/Plan Principal Problem:   Acute respiratory disease due to COVID-19 virus Active Problems:   Essential hypertension   Diabetes mellitus (HCC)    1. Acute respiratory illness due  to COVID-19 - 1. Not requiring O2 at the moment, but at significant risk of decompensation given co morbidities and age. 2. Cont pulse ox 3. IVF: NS at 76 4. Checking D.Dimer, pro calcitonin, CRP 5. Use Remdesivir if she decompensates 6. Daily CBC/BMP 2. HTN - 1. Continue home amlodipine 2. Hold chlorthalidone 3. Continue clonidine and hydralazine 3. DM2 - 1. Carb mod diet 2. Mod scale SSI AC and HS ordered with HS dosing to be done tonight. 4. Hypokalemia - Replace  DVT prophylaxis: Lovenox Code Status: Full Family Communication: No family in room Disposition Plan: Home after admit Consults called: None Admission status: Admit to inpatient  Severity of Illness: The appropriate patient status for this patient is INPATIENT. Inpatient status is judged to be reasonable and necessary in order to provide the required intensity of service to ensure the patient's safety. The patient's presenting symptoms, physical exam findings, and initial  radiographic and laboratory data in the context of their chronic comorbidities is felt to place them at high risk for further clinical deterioration. Furthermore, it is not anticipated that the patient will be medically stable for discharge from the hospital within 2 midnights of admission. The following factors support the patient status of inpatient.   " The patient's presenting symptoms include Cough, SOB. " The initial radiographic and laboratory data are worrisome because of PNA on CXR, COVID-19 positive!  Mild creat bump to 1.3. " The chronic co-morbidities include HTN, DM2, age of 19.   * I certify that at the point of admission it is my clinical judgment that the patient will require inpatient hospital care spanning beyond 2 midnights from the point of admission due to high intensity of service, high risk for further deterioration and high frequency of surveillance required.*    Brezlyn Manrique M. DO [facility]  How to contact the [facility] Attending or Consulting provider 7A - 7P or covering provider during after hours 7P -7A, for this patient?  1. Check the care team in [facility] and look for a) attending/consulting [facility] provider listed and b) the [facility] team listed 2. Log into [email or web address]  Amion Physician Scheduling and messaging for groups and whole hospitals  On call and physician scheduling software for group practices, residents, hospitalists and other medical providers for call, clinic, rotation and shift schedules. On[employer] is a hospital-wide system for scheduling doctors and paging doctors on call. EasyPlot is for scientific plotting and data analysis.  [email or web address]  and use [place]'s universal password to access. If you do not have the password, please contact the hospital operator.  3. Locate the [facility] provider you are looking for under [facility] and page to a number that you can be directly reached. 4. If you still have difficulty reaching the provider, please  page the [facility] (Director on Call) for the Hospitalists listed on amion for assistance.  05/24/2018, 12:15 AM

## 2018-05-24 NOTE — Evaluation (Signed)
Physical Therapy Evaluation Patient Details Name: [name] MRN: [id number] DOB: 01-18-1946 Today's Date: 05/24/2018   History of Present Illness  73 y.o. female with medical history significant of DM2, HTN, stroke.  Patient presents tot he ED with c/o cough for past 1 week, COVID +  Clinical Impression  Pt admitted with above diagnosis. Pt currently with functional limitations due to the deficits listed below (see PT Problem List).  Pt with rapid fatigue, unsteady gait, although VSS with SpO2= upper 90s on RA, no significant DOE noted;  may need to try RW next PT visit to incr gait stability   Pt will benefit from skilled PT to increase their independence and safety with mobility to allow discharge to the venue listed below.       Follow Up Recommendations Home health PT(vs no PT f/u)    Equipment Recommendations  None recommended by PT    Recommendations for Other Services       Precautions / Restrictions Precautions Precautions: Other (comment) Precaution Comments: monitor VS Restrictions Weight Bearing Restrictions: No      Mobility  Bed Mobility Overal bed mobility: Needs Assistance Bed Mobility: Sit to Supine       Sit to supine: Min guard   General bed mobility comments: incr time and effort, min/guard to safely lower trunk  Transfers Overall transfer level: Needs assistance Equipment used: Rolling walker (2 wheeled);1 person hand held assist Transfers: Sit to/from Stand Sit to Stand: Min assist         General transfer comment: light assist to transition to stand  Ambulation/Gait Ambulation/Gait assistance: Min assist Gait Distance (Feet): 65 Feet Assistive device: 1 person hand held assist;IV Pole Gait Pattern/deviations: Step-through pattern;Decreased stride length;Drifts right/left     General Gait Details: slow, unsteady gait however no overt LOB, reliant on UE support/assist for balance (attempting to reach for bed frame and wall in  room)  Stairs            Wheelchair Mobility    Modified Rankin (Stroke Patients Only)       Balance Overall balance assessment: Needs assistance Sitting-balance support: No upper extremity supported;Feet supported Sitting balance-Leahy Scale: Good       Standing balance-Leahy Scale: Fair Standing balance comment: reliant on at least unilateral UE support for dynamic balance                              Pertinent Vitals/Pain Pain Assessment: No/denies pain    Home Living Family/patient expects to be discharged to:: Private residence Living Arrangements: Alone   Type of Home: House Home Access: Stairs to enter Entrance Stairs-Rails: None Entrance Stairs-Number of Steps: 1 (threshold) Home Layout: One level Home Equipment: Walker - 2 wheels;Cane - single point Additional Comments: pt "thinks" she has walker and cane    Prior Function Level of Independence: Independent               Hand Dominance        Extremity/Trunk Assessment   Upper Extremity Assessment Upper Extremity Assessment: Overall WFL for tasks assessed    Lower Extremity Assessment Lower Extremity Assessment: Overall WFL for tasks assessed(fatigues easily)       Communication   Communication: No difficulties  Cognition Arousal/Alertness: Awake/alert Behavior During Therapy: WFL for tasks assessed/performed Overall Cognitive Status: Within Functional Limits for tasks assessed  General Comments      Exercises     Assessment/Plan    PT Assessment Patient needs continued PT services  PT Problem List Decreased mobility;Decreased activity tolerance;Cardiopulmonary status limiting activity;Decreased balance       PT Treatment Interventions Functional mobility training;Gait training;Therapeutic activities;Patient/family education;Therapeutic exercise;Balance training;DME instruction    PT Goals (Current goals  can be found in the Care Plan section)  Acute Rehab PT Goals Patient Stated Goal: to feel better PT Goal Formulation: With patient Time For Goal Achievement: 06/07/18 Potential to Achieve Goals: Good    Frequency Min 4X/week   Barriers to discharge        Co-evaluation               AM-PAC PT "6 Clicks" Mobility  Outcome Measure Help needed turning from your back to your side while in a flat bed without using bedrails?: A Little Help needed moving from lying on your back to sitting on the side of a flat bed without using bedrails?: A Little Help needed moving to and from a bed to a chair (including a wheelchair)?: A Little Help needed standing up from a chair using your arms (e.g., wheelchair or bedside chair)?: A Little Help needed to walk in hospital room?: A Little Help needed climbing 3-5 steps with a railing? : A Little 6 Click Score: 18    End of Session   Activity Tolerance: Patient limited by fatigue Patient left: in bed;with call bell/phone within reach Nurse Communication: Mobility status PT Visit Diagnosis: Unsteadiness on feet (R26.81)    Time: 9937-1696 PT Time Calculation (min) (ACUTE ONLY): 15 min   Charges:   PT Evaluation $PT Eval Low Complexity: 1 Low          Drucilla Chalet, PT  Pager: [phone] Acute Rehab Dept [facility]): [phone]   05/24/2018   [facility] 05/24/2018, 3:54 PM

## 2018-05-24 NOTE — ED Notes (Signed)
ED TO INPATIENT HANDOFF REPORT  ED Nurse Name and Phone #:   Adriana Ripperndrew RN  [phone]  S Name/Age/Gender Basilia JumboWilma Petite 73 y.o. female Room/Bed: 022C/022C  Code Status   Code Status: Full Code  Home/SNF/Other Home Patient oriented to: self, place, time and situation Is this baseline? Yes   Triage Complete: Triage complete  Chief Complaint Leg weakness, cough, weak/ frequant urination  Triage Note Pt reports productive cough with thick white mucus x 1 week. Pt reports weakness in both legs, headache, and sore throat, generalized body aches. Pt denies SHOB,  fever or sick contact.   Allergies Allergies  Allergen Reactions  . Codeine Other (See Comments)    REACTION: Hallucinations  . Ibuprofen Other (See Comments)    REACTION: Elevated Blood Pressure  . Imdur [Isosorbide Nitrate] Other (See Comments)    Headache & upset stomach    Level of Care/Admitting Diagnosis ED Disposition    ED Disposition Condition Comment   Admit  [facility] [[id number]]  Level of Care: Med-Surg [16]  Covid Evaluation: Confirmed COVID Positive  Isolation Risk Level: Low Risk/Droplet (Less than 4L [place] supplementation)  Diagnosis: Acute respiratory disease due to COVID-19 virus [[id number]][[phone]]  Admitting Physician: [name], [name] [[id number]]  Attending Physician: [name], [name] [[id number]]  Estimated length of stay: past midnight tomorrow  Certification:: I certify this patient will need inpatient services for at least 2 midnights  PT Class (Do Not Modify): Inpatient [101]  PT Acc Code (Do Not Modify): Private [1]       B Medical/Surgery History Past Medical History:  Diagnosis Date  . Diabetes mellitus without complication (HCC)   . GERD 09/04/2006   Qualifier: Diagnosis of  By: Duke SalviaGore, Denise    . History of echocardiogram    a. 2D ECHO: 11/06/2013 EF 65%; no WMA. Mild TR. PA pk pressure 43 mm HG  . Hypertension   . Stroke [facility](HCC)    Past Surgical History:  Procedure  Laterality Date  . ABDOMINAL HYSTERECTOMY    . LEFT HEART CATHETERIZATION WITH CORONARY ANGIOGRAM N/A 11/05/2013   Procedure: LEFT HEART CATHETERIZATION WITH CORONARY ANGIOGRAM;  Surgeon: Lennette Biharihomas A Kelly, MD;  [facility];  Service: Cardiovascular;  Laterality: N/A;     A IV Location/Drains/Wounds Patient Lines/Drains/Airways Status   Active Line/Drains/Airways    Name:   Placement date:   Placement time:   Site:   Days:   Peripheral IV 05/23/18 Left Forearm   05/23/18    2154    Forearm   1          Intake/Output Last 24 hours No intake or output data in the 24 hours ending 05/24/18 0028  Labs/Imaging Results for orders placed or performed during the hospital encounter of 05/23/18 (from the past 48 hour(s))  Basic metabolic panel     Status: Abnormal   Collection Time: 05/23/18  6:06 PM  Result Value Ref Range   Sodium 133 (L) 135 - 145 mmol/L   Potassium 2.9 (L) 3.5 - 5.1 mmol/L   Chloride 83 (L) 98 - 111 mmol/L   CO2 34 (H) 22 - 32 mmol/L   Glucose, Bld 435 (H) 70 - 99 mg/dL   BUN 15 8 - 23 mg/dL   Creatinine, Ser 9.561.30 (H) 0.44 - 1.00 mg/dL   Calcium 9.5 8.9 - 21.310.3 mg/dL   GFR calc non Af Amer 41 (L) >60 mL/min   GFR calc Af Amer 48 (L) >60 mL/min  Anion gap 16 (H) 5 - 15    Comment: Performed at [facility], [address] [place], [place] [address]  CBC     Status: None   Collection Time: 05/23/18  6:06 PM  Result Value Ref Range   WBC 5.3 4.0 - 10.5 K/uL   RBC 4.24 3.87 - 5.11 MIL/uL   Hemoglobin 12.4 12.0 - 15.0 g/dL   HCT 60.4 54.0 - 98.1 %   MCV 91.0 80.0 - 100.0 fL   MCH 29.2 26.0 - 34.0 pg   MCHC 32.1 30.0 - 36.0 g/dL   RDW 19.1 47.8 - 29.5 %   Platelets 208 150 - 400 K/uL   nRBC 0.0 0.0 - 0.2 %    Comment: Performed at [facility], [address] [place], [place] [address]  Urinalysis, Routine w reflex microscopic     Status: Abnormal   Collection Time: 05/23/18  8:13 PM  Result Value Ref Range   Color, Urine YELLOW YELLOW    APPearance CLEAR CLEAR   Specific Gravity, Urine 1.023 1.005 - 1.030   pH 7.0 5.0 - 8.0   Glucose, UA >=500 (A) NEGATIVE mg/dL   Hgb urine dipstick NEGATIVE NEGATIVE   Bilirubin Urine NEGATIVE NEGATIVE   Ketones, ur NEGATIVE NEGATIVE mg/dL   Protein, ur NEGATIVE NEGATIVE mg/dL   Nitrite NEGATIVE NEGATIVE   Leukocytes,Ua NEGATIVE NEGATIVE   RBC / HPF 0-5 0 - 5 RBC/hpf   WBC, UA 0-5 0 - 5 WBC/hpf   Bacteria, UA RARE (A) NONE SEEN    Comment: Performed at [facility], [address] [place], [place] [address]  Brain natriuretic peptide     Status: None   Collection Time: 05/23/18  9:55 PM  Result Value Ref Range   B Natriuretic Peptide 39.8 0.0 - 100.0 pg/mL    Comment: Performed at [facility], [address] [place], [place] [address]  Troponin I - Once     Status: Abnormal   Collection Time: 05/23/18  9:55 PM  Result Value Ref Range   Troponin I 0.03 (HH) <0.03 ng/mL    Comment: CRITICAL RESULT CALLED TO, READ BACK BY AND VERIFIED WITH: RN R KENNEDY  05/23/18 BY S GEZAHEGN Performed at [facility], [address] [place], [place] [address]   Magnesium     Status: None   Collection Time: 05/23/18  9:55 PM  Result Value Ref Range   Magnesium 1.9 1.7 - 2.4 mg/dL    Comment: Performed at [facility], [address], [place], [place] [address]  SARS Coronavirus 2 [facility] order, Performed in [facility] hospital lab)     Status: Abnormal   Collection Time: 05/23/18  9:55 PM  Result Value Ref Range   SARS Coronavirus 2 POSITIVE (A) NEGATIVE    Comment: RESULT CALLED TO, READ BACK BY AND VERIFIED WITH: Pattricia Boss RN [id number] 05/23/18 A BROWNING (NOTE) If result is NEGATIVE SARS-CoV-2 target nucleic acids are NOT DETECTED. The SARS-CoV-2 RNA is generally detectable in upper and lower  respiratory specimens during the acute phase of infection. The lowest  concentration of SARS-CoV-2 viral copies this assay can detect is 250  copies / mL. A negative  result does not preclude SARS-CoV-2 infection  and should not be used as the sole basis for treatment or other  patient management decisions.  A negative result may occur with  improper specimen collection / handling, submission of specimen other  than nasopharyngeal swab, presence of viral mutation(s) within the  areas targeted by this assay, and inadequate number of viral copies  (<250 copies / mL). A negative result must be combined with clinical  observations, patient history, and epidemiological information. If result is POSITIVE SARS-CoV-2 target nucleic acids are DETECTED. T he SARS-CoV-2 RNA is generally detectable in upper and lower  respiratory specimens during the acute phase of infection.  Positive  results are indicative of active infection with SARS-CoV-2.  Clinical  correlation with patient history and other diagnostic information is  necessary to determine patient infection status.  Positive results do  not rule out bacterial infection or co-infection with other viruses. If result is PRESUMPTIVE POSTIVE SARS-CoV-2 nucleic acids MAY BE PRESENT.   A presumptive positive result was obtained on the submitted specimen  and confirmed on repeat testing.  While 2019 novel coronavirus  (SARS-CoV-2) nucleic acids may be present in the submitted sample  additional confirmatory testing may be necessary for epidemiological  and / or clinical management purposes  to differentiate between  SARS-CoV-2 and other Sarbecovirus currently known to infect humans.  If clinically indicated additional testing with an alternate test  methodology [phone]) is  advised. The SARS-CoV-2 RNA is generally  detectable in upper and lower respiratory specimens during the acute  phase of infection. The expected result is Negative. Fact Sheet for Patients:  BoilerBrush.com.cy Fact Sheet for Healthcare Providers: https://pope.com/ This test is not yet  approved or cleared by the [place] FDA and has been authorized for detection and/or diagnosis of SARS-CoV-2 by FDA under an Emergency Use Authorization (EUA).  This EUA will remain in effect (meaning this test can be used) for the duration of the COVID-19 declaration under Section 564(b)(1) of the Act, 21 U.S.C. section 360bbb-3(b)(1), unless the authorization is terminated or revoked sooner. Performed at [facility], [address], [place], [place] [address]    Dg Chest Port 1 View  Result Date: 05/23/2018 CLINICAL DATA:  Cough EXAM: PORTABLE CHEST 1 VIEW COMPARISON:  02/10/2018 chest radiograph. FINDINGS: Stable cardiomediastinal silhouette with normal heart size. No pneumothorax. No pleural effusion. New mild hazy opacity at the right costophrenic angle. No pulmonary edema. IMPRESSION: New mild hazy opacity at the right costophrenic angle, cannot exclude pneumonia. Chest radiograph follow-up advised. Electronically Signed   By: Delbert Phenix [name].D.   On: 05/23/2018 21:40    Pending Labs Unresulted Labs (From admission, onward)    Start     Ordered   05/24/18 0500  C-reactive protein  Daily,   R     05/23/18 2345   05/24/18 0500  D-dimer, quantitative (not at [facility])  Daily,   R     05/23/18 2345   05/24/18 0500  CBC with Differential/Platelet  Daily,   R     05/23/18 2345   05/24/18 0500  Comprehensive metabolic panel  Daily,   R     05/23/18 2345   05/23/18 2344  D-dimer, quantitative (not at [facility])  Once,   R     05/23/18 2345   05/23/18 2344  Ferritin  Once,   R     05/23/18 2345   05/23/18 2344  Procalcitonin  Once,   R     05/23/18 2345   05/23/18 2344  C-reactive protein  Once,   R     05/23/18 2345   05/23/18 2339  ABO/Rh  Once,   R     05/23/18 2345  Vitals/Pain Today's Vitals   05/23/18 2245 05/23/18 2300 05/23/18 2345 05/24/18 0015  BP: (!) 147/69 (!) 157/75 (!) 172/75 (!) 159/65  Pulse: 75 79 66 72  Resp:      Temp:      TempSrc:       SpO2: 98% 98% 98% 99%  PainSc:        Isolation Precautions Airborne and Contact precautions  Medications Medications  insulin aspart (novoLOG) injection 0-15 Units (has no administration in time range)  insulin aspart (novoLOG) injection 0-5 Units (has no administration in time range)  enoxaparin (LOVENOX) injection 40 mg (has no administration in time range)  albuterol (VENTOLIN HFA) 108 (90 Base) MCG/ACT inhaler 2 puff (has no administration in time range)  guaiFENesin-dextromethorphan (ROBITUSSIN DM) 100-10 MG/5ML syrup 10 mL (has no administration in time range)  amLODipine (NORVASC) tablet 10 mg (has no administration in time range)  cloNIDine (CATAPRES) tablet 0.2 mg (has no administration in time range)  hydrALAZINE (APRESOLINE) tablet 25 mg (has no administration in time range)  mirabegron ER (MYRBETRIQ) tablet 25 mg (has no administration in time range)  0.9 %  sodium chloride infusion (has no administration in time range)  potassium chloride SA (K-DUR) CR tablet 40 mEq (40 mEq Oral Given 05/23/18 2139)  benzonatate (TESSALON) capsule 100 mg (100 mg Oral Given 05/23/18 2139)  cefTRIAXone (ROCEPHIN) 1 g in sodium chloride 0.9 % 100 mL IVPB (1 g Intravenous New Bag/Given 05/23/18 2253)  doxycycline (VIBRA-TABS) tablet 100 mg (100 mg Oral Given 05/23/18 2252)  sodium chloride 0.9 % bolus 500 mL (500 mLs Intravenous New Bag/Given 05/23/18 2252)    Mobility walks Moderate fall risk   Focused Assessments Pulmonary Assessment Handoff:  Lung sounds:   O2 Device: Room Air        R Recommendations: See Admitting Provider Note  Report given to:   Additional Notes:

## 2018-05-24 NOTE — Progress Notes (Addendum)
Patient admitted earlier this morning.  H&P is reviewed.  Patient seen and examined.  Patient complains of generalized fatigue.  Cough which is dry.  Denies any chest pain.  Minimal shortness of breath.  No nausea or vomiting.  Afebrile this morning.  Vital signs stable for the most part.  Noted to be mildly hypertensive.  Saturating normal on room air. Lungs reveal coarse breath sounds bilaterally.  No wheezing rales or rhonchi.   Abdomen is soft nontender nondistended.  No labs noted for today.  Will be ordered.  She was noted to be hyponatremic and hypokalemic yesterday.  Creatinine was also mildly elevated. CRP was 11.6  Ferritin 154.  Procalcitonin less than 0.1.  Hazy opacity noted in the right lower lung.  Patient with minimal symptoms from her COVID-19.  Continue to monitor labs.  Currently not requiring oxygen but she did have lab abnormalities.  She was noted to be quite fatigued.  We will recheck labs today and tomorrow.  Ambulate.  Monitor CBGs.  We do not have Remdesivir yet in this facility.  She was given antibiotics in the ED overnight.  Her WBC is normal.  Procalcitonin is less than 0.1.  No clear indication to use antibacterials at this time.  Noted to be persistently hypokalemic.  We will aggressively replete.  Magnesium was normal.  Continue to monitor closely.  Osvaldo Shipper 05/24/2018 9:21 AM

## 2018-05-24 NOTE — Progress Notes (Signed)
Pt arrived from [facility] ED A&O x4; no distress noted. Able to communicate effectively. Ambulates w/1 person assistance.

## 2018-05-25 LAB — GLUCOSE, CAPILLARY
Glucose-Capillary: 196 mg/dL — ABNORMAL HIGH (ref 70–99)
Glucose-Capillary: 277 mg/dL — ABNORMAL HIGH (ref 70–99)
Glucose-Capillary: 149 mg/dL — ABNORMAL HIGH (ref 70–99)

## 2018-05-25 LAB — CBC WITH DIFFERENTIAL/PLATELET
Abs Immature Granulocytes: 0.02 10*3/uL (ref 0.00–0.07)
Basophils Absolute: 0 10*3/uL (ref 0.0–0.1)
Basophils Relative: 0 %
Eosinophils Absolute: 0 10*3/uL (ref 0.0–0.5)
Eosinophils Relative: 0 %
HCT: 34.9 % — ABNORMAL LOW (ref 36.0–46.0)
Hemoglobin: 11.1 g/dL — ABNORMAL LOW (ref 12.0–15.0)
Immature Granulocytes: 0 %
Lymphocytes Relative: 27 %
Lymphs Abs: 1.3 10*3/uL (ref 0.7–4.0)
MCH: 30.1 pg (ref 26.0–34.0)
MCHC: 31.8 g/dL (ref 30.0–36.0)
MCV: 94.6 fL (ref 80.0–100.0)
Monocytes Absolute: 0.4 10*3/uL (ref 0.1–1.0)
Monocytes Relative: 9 %
Neutro Abs: 2.9 10*3/uL (ref 1.7–7.7)
Neutrophils Relative %: 64 %
Platelets: 205 10*3/uL (ref 150–400)
RBC: 3.69 MIL/uL — ABNORMAL LOW (ref 3.87–5.11)
RDW: 12.2 % (ref 11.5–15.5)
WBC: 4.7 10*3/uL (ref 4.0–10.5)
nRBC: 0 % (ref 0.0–0.2)

## 2018-05-25 LAB — COMPREHENSIVE METABOLIC PANEL
ALT: 12 U/L (ref 0–44)
AST: 23 U/L (ref 15–41)
Albumin: 2.8 g/dL — ABNORMAL LOW (ref 3.5–5.0)
Alkaline Phosphatase: 108 U/L (ref 38–126)
Anion gap: 10 (ref 5–15)
BUN: 11 mg/dL (ref 8–23)
CO2: 27 mmol/L (ref 22–32)
Calcium: 8 mg/dL — ABNORMAL LOW (ref 8.9–10.3)
Chloride: 102 mmol/L (ref 98–111)
Creatinine, Ser: 0.91 mg/dL (ref 0.44–1.00)
GFR calc Af Amer: >60 mL/min (ref >60)
GFR calc non Af Amer: >60 mL/min (ref >60)
Glucose, Bld: 203 mg/dL — ABNORMAL HIGH (ref 70–99)
Potassium: 3.7 mmol/L (ref 3.5–5.1)
Sodium: 139 mmol/L (ref 135–145)
Total Bilirubin: 0.5 mg/dL (ref 0.3–1.2)
Total Protein: 7 g/dL (ref 6.5–8.1)

## 2018-05-25 LAB — C-REACTIVE PROTEIN: CRP: 7.9 mg/dL — ABNORMAL HIGH (ref <1.0)

## 2018-05-25 LAB — D-DIMER, QUANTITATIVE: D-Dimer, Quant: 0.88 ug/mL-FEU — ABNORMAL HIGH (ref 0.00–0.50)

## 2018-05-25 NOTE — Progress Notes (Signed)
Occupational Therapy Evaluation Patient Details Name: [name] MRN: [id number] DOB: November 13, 1945 Today's Date: 05/25/2018    History of Present Illness 73 y.o. female with medical history significant of DM2, HTN, stroke.  Patient presents tot he ED with c/o cough for past 1 week, COVID +   Clinical Impression   PTA, pt lived alone and was independent with ADL and mobility. Session limited by orthostasis. SEated 130/58; standing 114/50, 1 rest break, seated 117/53; standing 94/55. Pt dizzy and unable to ambulate. Nursing made aware and states pt was given BP meds for HTN prior to session. Pt states she will have limited help after DC. Recommend follow up with HHOT to facilitate independence with ADL and IADL tasks and reduce risk of readmission. Will follow acutely.     Follow Up Recommendations  Home health OT;Supervision - Intermittent    Equipment Recommendations  None recommended by OT    Recommendations for Other Services       Precautions / Restrictions Precautions Precautions: Fall Precaution Comments: orthostatic      Mobility Bed Mobility               General bed mobility comments: OOB in chair  Transfers Overall transfer level: Needs assistance Equipment used: Rolling walker (2 wheeled);1 person hand held assist   Sit to Stand: Min guard              Balance     Sitting balance-Leahy Scale: Good       Standing balance-Leahy Scale: Fair                             ADL either performed or assessed with clinical judgement   ADL Overall ADL's : Needs assistance/impaired     Grooming: Set up;Sitting   Upper Body Bathing: Set up;Sitting   Lower Body Bathing: Minimal assistance;Sit to/from stand   Upper Body Dressing : Set up;Sitting   Lower Body Dressing: Minimal assistance;Sit to/from stand   Toilet Transfer: Minimal Pharmacist, communityassistance;RW;BSC Toilet Transfer Details (indicate cue type and reason): unable to ambulate to bathroom due  to orthostasis Toileting- Clothing Manipulation and Hygiene: Supervision/safety       Functional mobility during ADLs: Min guard;Rolling walker General ADL Comments: limited by fatigue/complalints of dizziness     Vision         Perception     Praxis      Pertinent Vitals/Pain Pain Assessment: No/denies pain     Hand Dominance Right   Extremity/Trunk Assessment Upper Extremity Assessment Upper Extremity Assessment: Generalized weakness   Lower Extremity Assessment Lower Extremity Assessment: Defer to PT evaluation   Cervical / Trunk Assessment Cervical / Trunk Assessment: Normal   Communication     Cognition Arousal/Alertness: Awake/alert Behavior During Therapy: Flat affect Overall Cognitive Status: Within Functional Limits for tasks assessed                                     General Comments  Issued energy conservation strategies    Exercises Exercises: Other exercises Other Exercises Other Exercises: general Covid HEP with level 1 theraband - issued written handout   Shoulder Instructions      Home Living Family/patient expects to be discharged to:: Private residence Living Arrangements: Alone Available Help at Discharge: Family;Available PRN/intermittently Type of Home: House Home Access: Stairs to enter Entrance Stairs-Number of Steps: 1 (threshold) Entrance  Stairs-Rails: None Home Layout: One level     Bathroom Shower/Tub: Chief Strategy Officer: Standard Bathroom Accessibility: Yes How Accessible: Accessible via walker Home Equipment: Walker - 2 wheels;Cane - single point          Prior Functioning/Environment Level of Independence: Independent                 OT Problem List: Decreased strength;Decreased activity tolerance;Decreased safety awareness;Decreased knowledge of use of DME or AE;Cardiopulmonary status limiting activity      OT Treatment/Interventions: Self-care/ADL training;Therapeutic  exercise;Energy conservation;DME and/or AE instruction;Therapeutic activities;Patient/family education    OT Goals(Current goals can be found in the care plan section) Acute Rehab OT Goals Patient Stated Goal: to feel better OT Goal Formulation: With patient Time For Goal Achievement: 06/08/18 Potential to Achieve Goals: Good  OT Frequency: Min 3X/week   Barriers to D/C: Decreased caregiver support          Co-evaluation              AM-PAC OT "6 Clicks" Daily Activity     Outcome Measure Help from another person eating meals?: None Help from another person taking care of personal grooming?: None Help from another person toileting, which includes using toliet, bedpan, or urinal?: A Little Help from another person bathing (including washing, rinsing, drying)?: A Little Help from another person to put on and taking off regular upper body clothing?: A Little Help from another person to put on and taking off regular lower body clothing?: A Little 6 Click Score: 20   End of Session Equipment Utilized During Treatment: Gait belt;Rolling walker Nurse Communication: Mobility status;Other (comment)(BP)  Activity Tolerance: Other (comment);Treatment limited secondary to medical complications (Comment)(limited by orthostasis) Patient left: in chair;with call bell/phone within reach  OT Visit Diagnosis: Unsteadiness on feet (R26.81);Muscle weakness (generalized) (M62.81);Dizziness and giddiness (R42)                Time: 4585-9292 OT Time Calculation (min): 37 min Charges:  OT General Charges $OT Visit: 1 Visit OT Evaluation $OT Eval Moderate Complexity: 1 Mod OT Treatments $Self Care/Home Management : 8-22 mins  [name], OT/L   Acute OT Clinical Specialist Acute Rehabilitation Services Pager [phone] Office [phone]   [facility] 05/25/2018, 1:36 PM

## 2018-05-25 NOTE — Progress Notes (Signed)
Pt declined nurse to update family member.

## 2018-05-25 NOTE — Progress Notes (Signed)
PROGRESS NOTE                                                                                                                                                                                                             Patient Demographics:    [name], is a 73 y.o. female, DOB - [birth year], ZOX:[id number]  Outpatient Primary MD for the patient is Patient, No Pcp Per    LOS - 2  Chief Complaint  Patient presents with  . Cough  . Weakness       Brief Narrative   Patient is a 73 year old female with history of DM-2, HTN, CVA who presented to the ED with 1 week history of productive cough, generalized weakness-found to be COVID 19+ with lower lobe pneumonia.   Subjective:    [name] feels dizzy and weak this morning.   Assessment  & Plan :   COVID-19 viral pneumonia: Afebrile overnight-looks very weak and frail-continue supportive care.  Remains on room air this morning.  Orthostatic hypotension: Dizzy this morning-stop all antihypertensives-continue IV fluids.  Apply TED hose.  Reassess tomorrow.  Hypokalemia: Repleted-recheck periodically.  DM-2: CBGs stable-Continue SSI  Deconditioning/weakness/malaise: Suspect secondary to COVID 19 viral pneumonia and orthostatic hypotension.  Continue evaluation by rehab services-probably will require home health services on discharge.  ABG     Component Value Date/Time   TCO2 23 06/04/2014 0024    COVID-19 Labs  Recent Labs    05/23/18 2359 05/25/18 0355 05/25/18 0415  DDIMER 0.96* 0.88*  --   FERRITIN 154  --   --   CRP 11.6*  --  7.9*        Component Value Date/Time   BNP 39.8 05/23/2018 2155    No results for input(s): PROBNP in the last 8760 hours.  Condition - Extremely Guarded  Family Communication  :    Code Status : Full code  Disposition Plan : Home with home health services  Consults  :  None  Procedures  :  None  DVT  Prophylaxis  :  Lovenox  Lab Results  Component Value Date   PLT 205 05/25/2018    Inpatient Medications  Scheduled Meds: . amLODipine  10 mg Oral Daily  . cloNIDine  0.2 mg Oral BID  . enoxaparin (LOVENOX) injection  40 mg Subcutaneous Q24H  .  famotidine  20 mg Oral Daily  . hydrALAZINE  25 mg Oral TID  . insulin aspart  0-15 Units Subcutaneous TID WC  . insulin aspart  0-5 Units Subcutaneous QHS  . lidocaine  1 patch Transdermal Q24H  . mirabegron ER  25 mg Oral Daily  . vitamin C  500 mg Oral BID  . zinc sulfate  220 mg Oral Daily   Continuous Infusions: . sodium chloride 75 mL/hr at 05/25/18 0559   PRN Meds:.albuterol, guaiFENesin-dextromethorphan  Antibiotics  :    Anti-infectives (From admission, onward)   Start     Dose/Rate Route Frequency Ordered Stop   05/23/18 2230  cefTRIAXone (ROCEPHIN) 1 g in sodium chloride 0.9 % 100 mL IVPB     1 g 200 mL/hr over 30 Minutes Intravenous  Once 05/23/18 2223 05/23/18 2323   05/23/18 2230  doxycycline (VIBRA-TABS) tablet 100 mg     100 mg Oral  Once 05/23/18 2223 05/23/18 2252       Time Spent in minutes  25   Jeoffrey Massed M.D on 05/25/2018 at 12:00 PM   [facility] -  Office  [phone]   Admit date - 05/23/2018    2    Objective:   Vitals:   05/25/18 0307 05/25/18 0400 05/25/18 0557 05/25/18 0800  BP: (!) 146/75 (!) 143/60  (!) 150/69  Pulse:  65  73  Resp:  (!) 22  (!) 22  Temp: 99.2 F (37.3 C)  98.6 F (37 C) 98.5 F (36.9 C)  TempSrc: Oral  Oral Oral  SpO2:  97%  97%  Weight:      Height:        Wt Readings from Last 3 Encounters:  05/24/18 70.5 kg  04/18/17 65.8 kg  09/23/16 65.8 kg     Intake/Output Summary (Last 24 hours) at 05/25/2018 1200 Last data filed at 05/25/2018 0844 Gross per 24 hour  Intake 2700.44 ml  Output -  Net 2700.44 ml     Physical Exam  Awake Alert, Oriented X 3, No new F.N deficits, Normal affect [place].AT,PERRAL Supple Neck,No JVD, No cervical  lymphadenopathy appriciated.  Symmetrical Chest wall movement, Good air movement bilaterally, CTAB RRR,No Gallops,Rubs or new Murmurs, No Parasternal Heave +ve B.Sounds, Abd Soft, No tenderness, No organomegaly appriciated, No rebound - guarding or rigidity. No Cyanosis, Clubbing or edema, No new Rash or bruise      Data Review:    CBC Recent Labs  Lab 05/23/18 1806 05/24/18 1000 05/25/18 0355  WBC 5.3 6.3 4.7  HGB 12.4 11.1* 11.1*  HCT 38.6 34.8* 34.9*  PLT 208 192 205  MCV 91.0 92.8 94.6  MCH 29.2 29.6 30.1  MCHC 32.1 31.9 31.8  RDW 11.9 12.2 12.2  LYMPHSABS  --  1.4 1.3  MONOABS  --  0.4 0.4  EOSABS  --  0.0 0.0  BASOSABS  --  0.0 0.0    Chemistries  Recent Labs  Lab 05/23/18 1806 05/23/18 2155 05/24/18 1000 05/25/18 0355  NA 133*  --  137 139  K 2.9*  --  2.8* 3.7  CL 83*  --  95* 102  CO2 34*  --  32 27  GLUCOSE 435*  --  318* 203*  BUN 15  --  16 11  CREATININE 1.30*  --  1.09* 0.91  CALCIUM 9.5  --  8.2* 8.0*  MG  --  1.9  --   --   AST  --   --   --  23  ALT  --   --   --  12  ALKPHOS  --   --   --  108  BILITOT  --   --   --  0.5   ------------------------------------------------------------------------------------------------------------------ No results for input(s): CHOL, HDL, LDLCALC, TRIG, CHOLHDL, LDLDIRECT in the last 72 hours.  Lab Results  Component Value Date   HGBA1C 6.4 (H) 11/05/2013   ------------------------------------------------------------------------------------------------------------------ No results for input(s): TSH, T4TOTAL, T3FREE, THYROIDAB in the last 72 hours.  Invalid input(s): FREET3  Cardiac Enzymes Recent Labs  Lab 05/23/18 2155  TROPONINI 0.03*   ------------------------------------------------------------------------------------------------------------------    Component Value Date/Time   BNP 39.8 05/23/2018 2155    Micro Results Recent Results (from the past 240 hour(s))  SARS Coronavirus 2  [facility] order, Performed in [facility] hospital lab)     Status: Abnormal   Collection Time: 05/23/18  9:55 PM  Result Value Ref Range Status   SARS Coronavirus 2 POSITIVE (A) NEGATIVE Final    Comment: RESULT CALLED TO, READ BACK BY AND VERIFIED WITH: Pattricia Boss RN 2308 05/23/18 A BROWNING (NOTE) If result is NEGATIVE SARS-CoV-2 target nucleic acids are NOT DETECTED. The SARS-CoV-2 RNA is generally detectable in upper and lower  respiratory specimens during the acute phase of infection. The lowest  concentration of SARS-CoV-2 viral copies this assay can detect is 250  copies / mL. A negative result does not preclude SARS-CoV-2 infection  and should not be used as the sole basis for treatment or other  patient management decisions.  A negative result may occur with  improper specimen collection / handling, submission of specimen other  than nasopharyngeal swab, presence of viral mutation(s) within the  areas targeted by this assay, and inadequate number of viral copies  (<250 copies / mL). A negative result must be combined with clinical  observations, patient history, and epidemiological information. If result is POSITIVE SARS-CoV-2 target nucleic acids are DETECTED. T he SARS-CoV-2 RNA is generally detectable in upper and lower  respiratory specimens during the acute phase of infection.  Positive  results are indicative of active infection with SARS-CoV-2.  Clinical  correlation with patient history and other diagnostic information is  necessary to determine patient infection status.  Positive results do  not rule out bacterial infection or co-infection with other viruses. If result is PRESUMPTIVE POSTIVE SARS-CoV-2 nucleic acids MAY BE PRESENT.   A presumptive positive result was obtained on the submitted specimen  and confirmed on repeat testing.  While 2019 novel coronavirus  (SARS-CoV-2) nucleic acids may be present in the submitted sample  additional confirmatory testing may  be necessary for epidemiological  and / or clinical management purposes  to differentiate between  SARS-CoV-2 and other Sarbecovirus currently known to infect humans.  If clinically indicated additional testing with an alternate test  methodology [phone]) is  advised. The SARS-CoV-2 RNA is generally  detectable in upper and lower respiratory specimens during the acute  phase of infection. The expected result is Negative. Fact Sheet for Patients:  BoilerBrush.com.cy Fact Sheet for Healthcare Providers: https://pope.com/ This test is not yet approved or cleared by the [place] FDA and has been authorized for detection and/or diagnosis of SARS-CoV-2 by FDA under an Emergency Use Authorization (EUA).  This EUA will remain in effect (meaning this test can be used) for the duration of the COVID-19 declaration under Section 564(b)(1) of the Act, 21 U.S.C. section 360bbb-3(b)(1), unless the authorization is terminated or revoked sooner. Performed at [facility], [address] [place], [place] [address]     Radiology Reports Dg Chest Port 1 View  Result Date: 05/23/2018 CLINICAL DATA:  Cough EXAM: PORTABLE CHEST 1 VIEW COMPARISON:  02/10/2018 chest radiograph. FINDINGS: Stable cardiomediastinal silhouette with normal heart size. No pneumothorax. No pleural effusion. New mild hazy opacity at the right costophrenic angle. No pulmonary edema. IMPRESSION: New mild hazy opacity at the right costophrenic angle, cannot exclude pneumonia. Chest radiograph follow-up advised. Electronically Signed   By: Delbert Phenix M.D.   On: 05/23/2018 21:40

## 2018-05-26 LAB — COMPREHENSIVE METABOLIC PANEL
ALT: 11 U/L (ref 0–44)
AST: 20 U/L (ref 15–41)
Albumin: 2.9 g/dL — ABNORMAL LOW (ref 3.5–5.0)
Alkaline Phosphatase: 110 U/L (ref 38–126)
Anion gap: 8 (ref 5–15)
BUN: 9 mg/dL (ref 8–23)
CO2: 27 mmol/L (ref 22–32)
Calcium: 7.8 mg/dL — ABNORMAL LOW (ref 8.9–10.3)
Chloride: 103 mmol/L (ref 98–111)
Creatinine, Ser: 0.83 mg/dL (ref 0.44–1.00)
GFR calc Af Amer: >60 mL/min (ref >60)
GFR calc non Af Amer: >60 mL/min (ref >60)
Glucose, Bld: 166 mg/dL — ABNORMAL HIGH (ref 70–99)
Potassium: 3 mmol/L — ABNORMAL LOW (ref 3.5–5.1)
Sodium: 138 mmol/L (ref 135–145)
Total Bilirubin: 0.6 mg/dL (ref 0.3–1.2)
Total Protein: 7.2 g/dL (ref 6.5–8.1)

## 2018-05-26 LAB — CBC WITH DIFFERENTIAL/PLATELET
Abs Immature Granulocytes: 0.02 K/uL (ref 0.00–0.07)
Basophils Absolute: 0 K/uL (ref 0.0–0.1)
Basophils Relative: 0 %
Eosinophils Absolute: 0 K/uL (ref 0.0–0.5)
Eosinophils Relative: 0 %
HCT: 35.1 % — ABNORMAL LOW (ref 36.0–46.0)
Hemoglobin: 11.2 g/dL — ABNORMAL LOW (ref 12.0–15.0)
Immature Granulocytes: 0 %
Lymphocytes Relative: 20 %
Lymphs Abs: 1 K/uL (ref 0.7–4.0)
MCH: 29.6 pg (ref 26.0–34.0)
MCHC: 31.9 g/dL (ref 30.0–36.0)
MCV: 92.9 fL (ref 80.0–100.0)
Monocytes Absolute: 0.4 K/uL (ref 0.1–1.0)
Monocytes Relative: 8 %
Neutro Abs: 3.7 K/uL (ref 1.7–7.7)
Neutrophils Relative %: 72 %
Platelets: 238 K/uL (ref 150–400)
RBC: 3.78 MIL/uL — ABNORMAL LOW (ref 3.87–5.11)
RDW: 12.2 % (ref 11.5–15.5)
WBC: 5.1 K/uL (ref 4.0–10.5)
nRBC: 0 % (ref 0.0–0.2)

## 2018-05-26 LAB — GLUCOSE, CAPILLARY
Glucose-Capillary: 149 mg/dL — ABNORMAL HIGH (ref 70–99)
Glucose-Capillary: 165 mg/dL — ABNORMAL HIGH (ref 70–99)
Glucose-Capillary: 189 mg/dL — ABNORMAL HIGH (ref 70–99)
Glucose-Capillary: 222 mg/dL — ABNORMAL HIGH (ref 70–99)
Glucose-Capillary: 150 mg/dL — ABNORMAL HIGH (ref 70–99)
Glucose-Capillary: 147 mg/dL — ABNORMAL HIGH (ref 70–99)

## 2018-05-26 MED ORDER — POTASSIUM CHLORIDE CRYS ER 20 MEQ PO TBCR
40.0000 meq | EXTENDED_RELEASE_TABLET | Freq: Once | ORAL | Status: AC
  Administered 2018-05-26: 40 meq via ORAL
  Filled 2018-05-26: qty 2

## 2018-05-26 MED ORDER — PHENOL 1.4 % MT LIQD
1.0000 | OROMUCOSAL | Status: DC | PRN
  Administered 2018-05-26: 1 via OROMUCOSAL
  Filled 2018-05-26: qty 177

## 2018-05-26 NOTE — Progress Notes (Addendum)
PROGRESS NOTE                                                                                                                                                                                                             Patient Demographics:    [name], is a 73 y.o. female, DOB - [birth year], ZOX:[id number]  Outpatient Primary MD for the patient is Patient, No Pcp Per    LOS - 3  Chief Complaint  Patient presents with  . Cough  . Weakness       Brief Narrative   Patient is a 73 year old female with history of DM-2, HTN, CVA who presented to the ED with 1 week history of productive cough, generalized weakness-found to be COVID 19+ with lower lobe pneumonia.   Subjective:    [name] feels weak-claims that she just does not "feel good".  She however denies any shortness of breath or cough.  Orthostatic vital signs were negative this morning.   Assessment  & Plan :   COVID-19 viral pneumonia: Afebrile overnight-looks chronically sick appearing-remains on room air.  Her main complaint is just generalized malaise and weakness.  Continue to ambulate with physical therapy.  She lives by herself-I have asked case manager to evaluate to see if she requires support at home before considering discharge.   Orthostatic hypotension: Resolved-stop IV fluids-follow blood pressure-.  Continue TED hose.   Hypertension: Controlled-we will allow some mild permissive hypertension in the setting of orthostatic hypotension-continue with hydralazine.  Continue to hold amlodipine, chlorthalidone, and clonidine.  Hypokalemia: Replete and recheck tomorrow.  DM-2: CBGs stable-continue SSI  Deconditioning/weakness/malaise: Suspect secondary to COVID 19 viral pneumonia and orthostatic hypotension.  Evaluated by physical therapy services-recommendations are to pursue home health services on discharge.  She lives alone-needs case management  evaluation before consideration of discharge.   ABG     Component Value Date/Time   TCO2 23 06/04/2014 0024    COVID-19 Labs  Recent Labs    05/23/18 2359 05/25/18 0355 05/25/18 0415  DDIMER 0.96* 0.88*  --   FERRITIN 154  --   --   CRP 11.6*  --  7.9*        Component Value Date/Time   BNP 39.8 05/23/2018 2155    No results for input(s): PROBNP in the last 8760 hours.  Condition -guarded  Family Communication  : Spoke with patient's daughter over the phone  Code Status : Full code  Disposition Plan : Home with home health services-hopefully on 5/5  Consults  :  None  Procedures  :  None  DVT Prophylaxis  :  Lovenox  Lab Results  Component Value Date   PLT 238 05/26/2018    Inpatient Medications  Scheduled Meds: . enoxaparin (LOVENOX) injection  40 mg Subcutaneous Q24H  . famotidine  20 mg Oral Daily  . hydrALAZINE  25 mg Oral TID  . insulin aspart  0-15 Units Subcutaneous TID WC  . insulin aspart  0-5 Units Subcutaneous QHS  . lidocaine  1 patch Transdermal Q24H  . mirabegron ER  25 mg Oral Daily  . vitamin C  500 mg Oral BID  . zinc sulfate  220 mg Oral Daily   Continuous Infusions:  PRN Meds:.albuterol, guaiFENesin-dextromethorphan, phenol  Antibiotics  :    Anti-infectives (From admission, onward)   Start     Dose/Rate Route Frequency Ordered Stop   05/23/18 2230  cefTRIAXone (ROCEPHIN) 1 g in sodium chloride 0.9 % 100 mL IVPB     1 g 200 mL/hr over 30 Minutes Intravenous  Once 05/23/18 2223 05/23/18 2323   05/23/18 2230  doxycycline (VIBRA-TABS) tablet 100 mg     100 mg Oral  Once 05/23/18 2223 05/23/18 2252       Time Spent in minutes  25   Jeoffrey Massed M.D on 05/26/2018 at 10:38 AM   [facility] -  Office  [phone]   Admit date - 05/23/2018    3    Objective:   Vitals:   05/25/18 2125 05/26/18 0218 05/26/18 0636 05/26/18 0929  BP: 134/81 (!) 157/68  (!) 161/55  Pulse:      Resp:      Temp: 97.6 F (36.4  C) 98.4 F (36.9 C) 98.5 F (36.9 C)   TempSrc: Axillary Oral    SpO2:      Weight:      Height:        Wt Readings from Last 3 Encounters:  05/24/18 70.5 kg  04/18/17 65.8 kg  09/23/16 65.8 kg     Intake/Output Summary (Last 24 hours) at 05/26/2018 1038 Last data filed at 05/26/2018 0200 Gross per 24 hour  Intake 1200 ml  Output -  Net 1200 ml     Physical Exam  General appearance:Awake, alert, not in any distress.  Chronically sick appearing Eyes:no scleral icterus. HEENT: Atraumatic and Normocephalic Neck: supple, no JVD. Resp:Good air entry bilaterally,no rales or rhonchi CVS: S1 S2 regular, no murmurs.  GI: Bowel sounds present, Non tender and not distended with no gaurding, rigidity or rebound. Extremities: B/L Lower Ext shows no edema, both legs are warm to touch Neurology:  Non focal Musculoskeletal:No digital cyanosis Skin:No Rash, warm and dry Wounds:N/A    Data Review:    CBC Recent Labs  Lab 05/23/18 1806 05/24/18 1000 05/25/18 0355 05/26/18 0417  WBC 5.3 6.3 4.7 5.1  HGB 12.4 11.1* 11.1* 11.2*  HCT 38.6 34.8* 34.9* 35.1*  PLT 208 192 205 238  MCV 91.0 92.8 94.6 92.9  MCH 29.2 29.6 30.1 29.6  MCHC 32.1 31.9 31.8 31.9  RDW 11.9 12.2 12.2 12.2  LYMPHSABS  --  1.4 1.3 1.0  MONOABS  --  0.4 0.4 0.4  EOSABS  --  0.0 0.0 0.0  BASOSABS  --  0.0 0.0 0.0    Chemistries  Recent Labs  Lab 05/23/18 1806 05/23/18 2155 05/24/18 1000 05/25/18 0355 05/26/18 0417  NA 133*  --  137 139 138  K 2.9*  --  2.8* 3.7 3.0*  CL 83*  --  95* 102 103  CO2 34*  --  32 27 27  GLUCOSE 435*  --  318* 203* 166*  BUN 15  --  CREATININE 1.30*  --  1.09* 0.91 0.83  CALCIUM 9.5  --  8.2* 8.0* 7.8*  MG  --  1.9  --   --   --   AST  --   --   --  23 20  ALT  --   --   --  12 11  ALKPHOS  --   --   --  108 110  BILITOT  --   --   --  0.5 0.6    ------------------------------------------------------------------------------------------------------------------ No results for input(s): CHOL, HDL, LDLCALC, TRIG, CHOLHDL, LDLDIRECT in the last 72 hours.  Lab Results  Component Value Date   HGBA1C 6.4 (H) 11/05/2013   ------------------------------------------------------------------------------------------------------------------ No results for input(s): TSH, T4TOTAL, T3FREE, THYROIDAB in the last 72 hours.  Invalid input(s): FREET3  Cardiac Enzymes Recent Labs  Lab 05/23/18 2155  TROPONINI 0.03*   ------------------------------------------------------------------------------------------------------------------    Component Value Date/Time   BNP 39.8 05/23/2018 2155    Micro Results Recent Results (from the past 240 hour(s))  SARS Coronavirus 2 [facility] order, Performed in [facility] hospital lab)     Status: Abnormal   Collection Time: 05/23/18  9:55 PM  Result Value Ref Range Status   SARS Coronavirus 2 POSITIVE (A) NEGATIVE Final    Comment: RESULT CALLED TO, READ BACK BY AND VERIFIED WITH: Pattricia Boss RN 2308 05/23/18 A BROWNING (NOTE) If result is NEGATIVE SARS-CoV-2 target nucleic acids are NOT DETECTED. The SARS-CoV-2 RNA is generally detectable in upper and lower  respiratory specimens during the acute phase of infection. The lowest  concentration of SARS-CoV-2 viral copies this assay can detect is 250  copies / mL. A negative result does not preclude SARS-CoV-2 infection  and should not be used as the sole basis for treatment or other  patient management decisions.  A negative result may occur with  improper specimen collection / handling, submission of specimen other  than nasopharyngeal swab, presence of viral mutation(s) within the  areas targeted by this assay, and inadequate number of viral copies  (<250 copies / mL). A negative result must be combined with clinical  observations, patient history, and  epidemiological information. If result is POSITIVE SARS-CoV-2 target nucleic acids are DETECTED. T he SARS-CoV-2 RNA is generally detectable in upper and lower  respiratory specimens during the acute phase of infection.  Positive  results are indicative of active infection with SARS-CoV-2.  Clinical  correlation with patient history and other diagnostic information is  necessary to determine patient infection status.  Positive results do  not rule out bacterial infection or co-infection with other viruses. If result is PRESUMPTIVE POSTIVE SARS-CoV-2 nucleic acids MAY BE PRESENT.   A presumptive positive result was obtained on the submitted specimen  and confirmed on repeat testing.  While 2019 novel coronavirus  (SARS-CoV-2) nucleic acids may be present in the submitted sample  additional confirmatory testing may be necessary for epidemiological  and / or clinical management purposes  to differentiate between  SARS-CoV-2 and other Sarbecovirus currently known to infect humans.  If clinically indicated additional testing with an alternate test  methodology [phone])  is  advised. The SARS-CoV-2 RNA is generally  detectable in upper and lower respiratory specimens during the acute  phase of infection. The expected result is Negative. Fact Sheet for Patients:  BoilerBrush.com.cyhttps://www.fda.gov/media/136312/download Fact Sheet for Healthcare Providers: https://pope.com/https://www.fda.gov/media/136313/download This test is not yet approved or cleared by the [place] FDA and has been authorized for detection and/or diagnosis of SARS-CoV-2 by FDA under an Emergency Use Authorization (EUA).  This EUA will remain in effect (meaning this test can be used) for the duration of the COVID-19 declaration under Section 564(b)(1) of the Act, 21 U.S.C. section 360bbb-3(b)(1), unless the authorization is terminated or revoked sooner. Performed at [facility], [address] [place], [place] [id number]     Radiology  Reports Dg Chest Port 1 View  Result Date: 05/23/2018 CLINICAL DATA:  Cough EXAM: PORTABLE CHEST 1 VIEW COMPARISON:  02/10/2018 chest radiograph. FINDINGS: Stable cardiomediastinal silhouette with normal heart size. No pneumothorax. No pleural effusion. New mild hazy opacity at the right costophrenic angle. No pulmonary edema. IMPRESSION: New mild hazy opacity at the right costophrenic angle, cannot exclude pneumonia. Chest radiograph follow-up advised. Electronically Signed   By: Delbert PhenixJason A Poff M.D.   On: 05/23/2018 21:40

## 2018-05-26 NOTE — Progress Notes (Signed)
Discussed with CM that patient will possibly be discharged tomorrow and that she will need HH. CM stated that [facility] can be arranged.

## 2018-05-26 NOTE — TOC Initial Note (Signed)
Transition of Care [facility](TOC) - Initial/Assessment Note    Patient Details  Name: [name] MRN: [id number] Date of Birth: 09/23/1945  Transition of Care [facility](TOC) CM/SW Contact:    Colleen CanNatalie Jonavin Seder RN, BSN, NCM-BC, ACM-RN [phone] Phone Number: 05/26/2018, 3:16 PM  Clinical Narrative:                 CM spoke with the patient via phone to discuss the POC. Patient states she lives at home alone and was independent with her ADLs PTA. Patient indicated that she does not have anyone to assist her post-transition d/t her being COVID (+) and her daughter works. Patient states that she "thinks" she may be ok. HH ordered by Dr. Windell NorfolkS. Ghimire, with [facility]. Unable to verify PCP; unsure of the MD name; demographics/insurance verified. CMS HH Compare list discussed, [facility]. [facility], [facility]; AVS updated. Patient stated she will need transportation home; no family in place d/t COVID transmission risk. CM team will continue to follow.   Expected Discharge Plan: Home w Home Health Services Barriers to Discharge: Continued Medical Work up(COVID +; lives alone with deconditioning in onset of COVID symptoms with no caregiver in place)   Patient Goals and CMS Choice Patient states their goals for this hospitalization and ongoing recovery are:: "to feel better" CMS Medicare.gov Compare Post Acute Care list provided to:: Patient Choice offered to / list presented to : Patient  Expected Discharge Plan and Services Expected Discharge Plan: Home w Home Health Services In-house Referral: NA Discharge Planning Services: CM Consult, Other - See comment(Following for possible transportation home) Post Acute Care Choice: Home Health                   DME Arranged: N/A DME Agency: NA       HH Arranged: RN, Disease Management, PT, OT [facility] Agency Contacted: 05/26/18 Time HH Agency Contacted: [phone] Representative  [facility]: Lorenza Chickory Barnett RN liaison  Prior Living Arrangements/Services   Lives with:: Self Patient language and need for interpreter reviewed:: Yes Do you feel safe going back to the place where you live?: No   Patient was unsure if she could manage at home alone; will follow for further recommendations  Need for Family Participation in Patient Care: Yes (Comment) Care giver support system in place?: No (comment) Current home services: DME Criminal Activity/Legal Involvement Pertinent to Current Situation/Hospitalization: No - Comment as needed  Activities of Daily Living Home Assistive Devices/Equipment: None ADL Screening (condition at time of admission) Patient's cognitive ability adequate to safely complete daily activities?: Yes Is the patient deaf or have difficulty hearing?: No Does the patient have difficulty seeing, even when wearing glasses/contacts?: No Does the patient have difficulty concentrating, remembering, or making decisions?: No Patient able to express need for assistance with ADLs?: Yes Does the patient have difficulty dressing or bathing?: No Independently performs ADLs?: Yes (appropriate for developmental age) Does the patient have difficulty walking or climbing stairs?: No Weakness of Legs: Both Weakness of Arms/Hands: None  Permission Sought/Granted Permission sought to share information with : Case Manager, Magazine features editoracility Contact Representative Permission granted to share information with : Yes, Verbal Permission Granted     Permission granted to share info w AGENCY: HH agency        Emotional Assessment Appearance:: Other (Comment Required(POC discussed via phone) Attitude/Demeanor/Rapport: Engaged Affect (typically observed): Unable to Assess Orientation: :  Oriented to Self, Oriented to Situation, Oriented to Place, Oriented to  Time Alcohol / Substance Use: Not Applicable Psych Involvement: No (comment)  Admission diagnosis:  Hypokalemia  [E87.6] Hyperglycemia [R73.9] COVID-19 virus infection [U07.1] Patient Active Problem List   Diagnosis Date Noted  . Acute respiratory disease due to COVID-19 virus 05/23/2018  . NSTEMI (non-ST elevated myocardial infarction) (HCC) 11/05/2013  . Spinal stenosis of cervical region 10/20/2012  . Prolonged QT interval 10/18/2012  . Left sided numbness 10/18/2012  . Palpitations 10/18/2012  . 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 Numbness 06/20/2012  . Diabetes mellitus (HCC) 06/20/2012  . CVA (cerebral infarction) 06/20/2012  . Hypokalemia 06/20/2012  . ARTHRALGIA 01/28/2009  . CHEST PAIN UNSPECIFIED 11/26/2008  . DEPRESSION 09/04/2006  . Essential hypertension 09/04/2006  . GERD 09/04/2006  . MULTIPLE SITES, ARTHRITIS-ACUTE/CHRONIC 09/04/2006  . LOW BACK PAIN, CHRONIC 09/04/2006   PCP:  Posey Rea of name Pharmacy:   [facility] [phone] [place], [place] - [address] [place] [address] Phone: [phone] Fax: [phone]

## 2018-05-26 NOTE — Progress Notes (Signed)
Physical Therapy Treatment Patient Details Name: Adriana JumboWilma [name] MRN: [id number] DOB: [birth year] Today's Date: 05/26/2018    History of Present Illness 73 y.o. female with medical history significant of DM2, HTN, stroke.  Patient presents tot he ED with c/o cough for past 1 week, COVID +    PT Comments    Pt not able to progress gait far as she could not stop coughing during mobility.  She reported significant fatigue, aches, malaise, and generally not feeling well.  She was able to do 3 of the LE HEP exercises seated in the recliner chair. HR went up to 180s at one point, but came right back down (I am not sure if it was an accurate reading and pt did not report any distress during the HR increase).  HR back into the 90s at the end of the session.  PT will continue to follow acutely for safe mobility progression.    Follow Up Recommendations  Home health PT     Equipment Recommendations  None recommended by PT    Recommendations for Other Services   NA     Precautions / Restrictions Precautions Precautions: Fall Precaution Comments: BP stability issues    Mobility  Bed Mobility Overal bed mobility: Needs Assistance Bed Mobility: Supine to Sit     Supine to sit: Min assist     General bed mobility comments: Heavy min assist to provide hand to pull up to sitting EOB.    Transfers Overall transfer level: Needs assistance Equipment used: Rolling walker (2 wheeled) Transfers: Sit to/from Stand Sit to Stand: Min guard         General transfer comment: Min guard assist to steady pt when coming to stand.   Ambulation/Gait Ambulation/Gait assistance: Min assist Gait Distance (Feet): 15 Feet Assistive device: Rolling walker (2 wheeled) Gait Pattern/deviations: Step-through pattern;Shuffle;Trunk flexed Gait velocity: decreased   General Gait Details: Pt with flexed trunk posture over RW.  Coughing throughout gait and mobility.  Spitting up sputum into bag.  Reporting "I  don't think I can do this"          Balance Overall balance assessment: Needs assistance Sitting-balance support: Feet supported;Bilateral upper extremity supported Sitting balance-Leahy Scale: Fair     Standing balance support: Bilateral upper extremity supported Standing balance-Leahy Scale: Poor Standing balance comment: Pt leaning heavily on RW today in standing.                             Cognition Arousal/Alertness: Awake/alert Behavior During Therapy: Flat affect Overall Cognitive Status: Within Functional Limits for tasks assessed                                 General Comments: Not specifically tested      Exercises General Exercises - Lower Extremity Heel Slides: AAROM;Both;10 reps Hip ABduction/ADduction: AROM;Both;10 reps Straight Leg Raises: AROM;Both;10 reps    General Comments General comments (skin integrity, edema, etc.): Pt reports feeling very poor today, generally achy, weak, maliaise, and once we started moving she could not stop coughing for more than a few seconds at a time until we were resting in the recliner chair.       Pertinent Vitals/Pain Pain Assessment: No/denies pain           PT Goals (current goals can now be found in the care plan section) Acute Rehab PT Goals Patient  Stated Goal: to feel better Progress towards PT goals: Progressing toward goals    Frequency    Min 5X/week      PT Plan Current plan remains appropriate       AM-PAC PT "6 Clicks" Mobility   Outcome Measure  Help needed turning from your back to your side while in a flat bed without using bedrails?: A Little Help needed moving from lying on your back to sitting on the side of a flat bed without using bedrails?: A Little Help needed moving to and from a bed to a chair (including a wheelchair)?: A Little Help needed standing up from a chair using your arms (e.g., wheelchair or bedside chair)?: A Little Help needed to walk in  hospital room?: A Little Help needed climbing 3-5 steps with a railing? : A Little 6 Click Score: 18    End of Session   Activity Tolerance: Patient limited by fatigue Patient left: in chair;with call bell/phone within reach   PT Visit Diagnosis: Unsteadiness on feet (R26.81)     Time: 1446-1510 PT Time Calculation (min) (ACUTE ONLY): 24 min  Charges:  $Gait Training: 8-22 mins $Therapeutic Exercise: 8-22 mins                    Owen Pagnotta B. Bricen Victory, PT, DPT  Acute Rehabilitation [phone] pager #[phone] office   05/26/2018, 5:26 PM

## 2018-05-26 NOTE — Evaluation (Addendum)
Occupational Therapy Treatment Patient Details Name: [name] MRN: [id number] DOB: 04-17-1945 Today's Date: 05/26/2018    History of Present Illness 73 y.o. female with medical history significant of DM2, HTN, stroke.  Patient presents tot he ED with c/o cough for past 1 week, COVID +   Clinical Impression   Pt progressing towards established OT goals. Pt continues to present with increased fatigue during ADLs and functional mobility. Pt performing functional mobility to bathroom and completing grooming, sponge bathe, and toileting with Min Guard A for safety. Pt requiring increased time and effort throughout ADLs. Pt requesting to return to bed at end of session. Continues to recommend dc to home with HHOT to optimize safety and return to PLOF. Will continue to follow acutely as admitted to facilitate safe dc.   HR 74-97 SpO2 99-97% on RA; RR 31-25 BP: supine 167/60, EOB 174/67, Sitting after standing 151/70, and supine at end of session 161/55    Follow Up Recommendations  Home health OT;Supervision - Intermittent    Equipment Recommendations  None recommended by OT    Recommendations for Other Services       Precautions / Restrictions Precautions Precautions: Fall Precaution Comments: orthostatic Restrictions Weight Bearing Restrictions: No      Mobility Bed Mobility Overal bed mobility: Needs Assistance Bed Mobility: Sit to Supine;Supine to Sit     Supine to sit: Min guard Sit to supine: Min guard   General bed mobility comments: Min Guard A for safety. Increased time and effort  Transfers Overall transfer level: Needs assistance Equipment used: Rolling walker (2 wheeled) Transfers: Sit to/from Stand Sit to Stand: Min guard         General transfer comment: Min Guard A for safety    Balance Overall balance assessment: Needs assistance Sitting-balance support: No upper extremity supported;Feet supported Sitting balance-Leahy Scale: Good        Standing balance-Leahy Scale: Fair Standing balance comment: reliant on at least unilateral UE support for dynamic balance                            ADL either performed or assessed with clinical judgement   ADL Overall ADL's : Needs assistance/impaired     Grooming: Supervision/safety;Min guard;Standing;Wash/dry hands;Wash/dry face;Oral care Grooming Details (indicate cue type and reason): Pt performing mutiple grooming tasks at the sink with supervision-Min Guard A for safety Upper Body Bathing: Min guard;Supervision/ safety;Standing Upper Body Bathing Details (indicate cue type and reason): Pt washing her axillary area at the sink with Supervision-Min Guard A Lower Body Bathing: Min guard;Sit to/from stand Lower Body Bathing Details (indicate cue type and reason): Pt washing peri care at sink with Min Guard A for safety         Toilet Transfer: Min guard;Ambulation;Regular Toilet;RW Statistician Details (indicate cue type and reason): Min Guard A for safety Toileting- Clothing Manipulation and Hygiene: Supervision/safety;Sitting/lateral lean       Functional mobility during ADLs: Min guard;Rolling walker General ADL Comments: Pt presenting with increased fatigue and moaning throughout session. Pt performing grooming, quick sponge bath, and toileting in bathroom with Supervision-Min Guard. Pt reporting dizziness; BP stable. Also reporting significant fatigue and chills.     Vision         Perception     Praxis      Pertinent Vitals/Pain Pain Assessment: No/denies pain     Hand Dominance     Extremity/Trunk Assessment Upper Extremity Assessment Upper Extremity Assessment:  Generalized weakness   Lower Extremity Assessment Lower Extremity Assessment: Defer to PT evaluation       Communication     Cognition Arousal/Alertness: Awake/alert Behavior During Therapy: Flat affect Overall Cognitive Status: Within Functional Limits for tasks assessed                                  General Comments: Pt repeating certain topics or requiring additional cues during session   General Comments  Pt reporting she has been using her theraband throughout the day.     Exercises     Shoulder Instructions      Home Living                                          Prior Functioning/Environment                   OT Problem List:        OT Treatment/Interventions:      OT Goals(Current goals can be found in the care plan section) Acute Rehab OT Goals Patient Stated Goal: to feel better OT Goal Formulation: With patient Time For Goal Achievement: 06/08/18 Potential to Achieve Goals: Good ADL Goals Pt Will Perform Lower Body Bathing: with modified independence;sit to/from stand Pt Will Perform Lower Body Dressing: with modified independence;sit to/from stand Pt Will Transfer to Toilet: with modified independence;ambulating Pt Will Perform Toileting - Clothing Manipulation and hygiene: with modified independence;sit to/from stand Pt/caregiver will Perform Home Exercise Program: Independently;Increased strength;Both right and left upper extremity;With written HEP provided Additional ADL Goal #1: Pt will independently verbalize 3 energy conservation strategies for ADL  OT Frequency: Min 3X/week   Barriers to D/C:            Co-evaluation              AM-PAC OT "6 Clicks" Daily Activity     Outcome Measure Help from another person eating meals?: None Help from another person taking care of personal grooming?: None Help from another person toileting, which includes using toliet, bedpan, or urinal?: A Little Help from another person bathing (including washing, rinsing, drying)?: A Little Help from another person to put on and taking off regular upper body clothing?: A Little Help from another person to put on and taking off regular lower body clothing?: A Little 6 Click Score: 20   End of  Session Equipment Utilized During Treatment: Rolling walker Nurse Communication: Mobility status;Other (comment)(BP; Wanting a fruit cup for breakfast and lunch)  Activity Tolerance: Patient tolerated treatment well;Patient limited by fatigue Patient left: with call bell/phone within reach;in bed;with bed alarm set;with nursing/sitter in room  OT Visit Diagnosis: Unsteadiness on feet (R26.81);Muscle weakness (generalized) (M62.81);Dizziness and giddiness (R42)                Time: [id number] OT Time Calculation (min): 39 min Charges:  OT General Charges $OT Visit: 1 Visit OT Treatments $Self Care/Home Management : 38-52 mins  [name] MSOT, OTR/L Acute Rehab Pager: [phone] Office: [phone]  Adriana GristCharis M Sterling [name] 05/26/2018, 10:13 AM

## 2018-05-27 LAB — BASIC METABOLIC PANEL
Anion gap: 10 (ref 5–15)
BUN: 5 mg/dL — ABNORMAL LOW (ref 8–23)
CO2: 28 mmol/L (ref 22–32)
Calcium: 8.3 mg/dL — ABNORMAL LOW (ref 8.9–10.3)
Chloride: 102 mmol/L (ref 98–111)
Creatinine, Ser: 0.83 mg/dL (ref 0.44–1.00)
GFR calc Af Amer: >60 mL/min (ref >60)
GFR calc non Af Amer: >60 mL/min (ref >60)
Glucose, Bld: 140 mg/dL — ABNORMAL HIGH (ref 70–99)
Potassium: 3 mmol/L — ABNORMAL LOW (ref 3.5–5.1)
Sodium: 140 mmol/L (ref 135–145)

## 2018-05-27 LAB — GLUCOSE, CAPILLARY
Glucose-Capillary: 147 mg/dL — ABNORMAL HIGH (ref 70–99)
Glucose-Capillary: 205 mg/dL — ABNORMAL HIGH (ref 70–99)
Glucose-Capillary: 226 mg/dL — ABNORMAL HIGH (ref 70–99)
Glucose-Capillary: 184 mg/dL — ABNORMAL HIGH (ref 70–99)

## 2018-05-27 MED ORDER — ACETAMINOPHEN 325 MG PO TABS
650.0000 mg | ORAL_TABLET | ORAL | Status: DC | PRN
  Administered 2018-05-28: 650 mg via ORAL
  Filled 2018-05-27: qty 2

## 2018-05-27 MED ORDER — POTASSIUM CHLORIDE CRYS ER 20 MEQ PO TBCR
40.0000 meq | EXTENDED_RELEASE_TABLET | Freq: Once | ORAL | Status: AC
  Administered 2018-05-27: 40 meq via ORAL
  Filled 2018-05-27: qty 2

## 2018-05-27 MED ORDER — POTASSIUM CHLORIDE CRYS ER 20 MEQ PO TBCR
40.0000 meq | EXTENDED_RELEASE_TABLET | ORAL | Status: DC
  Administered 2018-05-27: 40 meq via ORAL
  Filled 2018-05-27: qty 2

## 2018-05-27 MED ORDER — BUTALBITAL-APAP-CAFFEINE 50-325-40 MG PO TABS: 1.0000 | ORAL_TABLET | Freq: Four times a day (QID) | ORAL | Status: DC | PRN

## 2018-05-27 NOTE — Progress Notes (Signed)
Occupational Therapy Treatment Patient Details Name: [name] MRN: [id number] DOB: [birth year] Today's Date: 05/27/2018    History of present illness 73 y.o. female with medical history significant of DM2, HTN, stroke.  Patient presents tot he ED with c/o cough for past 1 week, COVID +   OT comments  Session completed on RA. Seated BP 160/64. Pt states  " I just don't feel good". Pt ambulating in room at modified independent level and able to complete toilet transfer, hygiene after toileting and LB ADL @ modified independent level with fatigue. Ambulated @ 150 ft @ RW level with SpO2 98; RR 27; HR 112. No SOB noted after ambulation. Pt complains of being "dizzy/just don't feel good".Will benefit from further education regarding energy conservation and reducing risk of falls. Continue to recommend home health follow up. Pt states her daughter will be able to assist with groceries.Feel pt safe to DC home when medically stable. Recommend HH Aide if available.    Follow Up Recommendations  Home health OT;Supervision - Intermittent ; HH Aide if available   Equipment Recommendations  None recommended by OT    Recommendations for Other Services      Precautions / Restrictions Precautions Precautions: Fall Precaution Comments: BP stability issues       Mobility Bed Mobility Overal bed mobility: Modified Independent   Transfers Overall transfer level: Modified independent Equipment used: Rolling walker (2 wheeled)           Balance     Sitting balance-Leahy Scale: Good       Standing balance-Leahy Scale: Fair  Pt able to completely bend over at waist to complete pericare. Would benefit from compensatory/alternative way to decrease fatigue and reduce risk of falls                             ADL either performed or assessed with clinical judgement   ADL       Grooming: Modified independent;Standing   Upper Body Bathing: Modified independent;Standing    Lower Body Bathing: Modified independent   Upper Body Dressing : Modified independent   Lower Body Dressing: Modified independent   Toilet Transfer: RW;Supervision/safety   Toileting- Clothing Manipulation and Hygiene: Modified independent       Functional mobility during ADLs: Modified independent;Rolling walker General ADL Comments: Pt able to bath/dress self adn ambulate in room and to bathroom without assistance without any overt LOB. Pt would benefit from educatioin regarding energy conservation strategies and reducing risk of falls. May benefit from having a reacher at home.  Pt does not shower/ only sponge baths    Vision       Perception     Praxis      Cognition Arousal/Alertness: Awake/alert Behavior During Therapy: Flat affect Overall Cognitive Status: Within Functional Limits for tasks assessed                                  General Comments: Declines to eat dinner; "I don't like this food"      Exercises     Shoulder Instructions       General Comments      Pertinent Vitals/ Pain       Pain Assessment: Faces Faces Pain Scale: Hurts little more Pain Location: "I just don't feel good" Pain Descriptors / Indicators: Discomfort;Grimacing Pain Intervention(s): Limited activity within patient's tolerance  Home Living  Prior Functioning/Environment              Frequency  Min 3X/week        Progress Toward Goals  OT Goals(current goals can now be found in the care plan section)  Progress towards OT goals: Progressing toward goals  Acute Rehab OT Goals Patient Stated Goal: to feel better OT Goal Formulation: With patient Time For Goal Achievement: 06/08/18 Potential to Achieve Goals: Good ADL Goals Pt Will Perform Lower Body Bathing: with modified independence;sit to/from stand Pt Will Perform Lower Body Dressing: with modified independence;sit to/from stand Pt  Will Transfer to Toilet: with modified independence;ambulating Pt Will Perform Toileting - Clothing Manipulation and hygiene: with modified independence;sit to/from stand Pt/caregiver will Perform Home Exercise Program: Independently;Increased strength;Both right and left upper extremity;With written HEP provided Additional ADL Goal #1: Pt will independently verbalize 3 energy conservation strategies for ADL  Plan Discharge plan remains appropriate    Co-evaluation                 AM-PAC OT "6 Clicks" Daily Activity     Outcome Measure   Help from another person eating meals?: None Help from another person taking care of personal grooming?: None Help from another person toileting, which includes using toliet, bedpan, or urinal?: None Help from another person bathing (including washing, rinsing, drying)?: None Help from another person to put on and taking off regular upper body clothing?: None Help from another person to put on and taking off regular lower body clothing?: None 6 Click Score: 24    End of Session Equipment Utilized During Treatment: Gait belt;Rolling walker  OT Visit Diagnosis: Unsteadiness on feet (R26.81);Muscle weakness (generalized) (M62.81);Dizziness and giddiness (R42)   Activity Tolerance Patient tolerated treatment well   Patient Left in bed;with call bell/phone within reach   Nurse Communication Mobility status        Time: 7588-3254 OT Time Calculation (min): 25 min  Charges: OT General Charges $OT Visit: 1 Visit OT Treatments $Self Care/Home Management : 23-37 mins  [name], OT/L   Acute OT Clinical Specialist Acute Rehabilitation Services Pager [phone] Office [phone]    [facility] 05/27/2018, 5:48 PM

## 2018-05-27 NOTE — Progress Notes (Signed)
Physical Therapy Treatment Patient Details Name: [name] MRN: [id number] DOB: 1945-12-29 Today's Date: 05/27/2018    History of Present Illness 73 y.o. female with medical history significant of DM2, HTN, stroke.  Patient presents tot he ED with c/o cough for past 1 week, COVID +    PT Comments     The patient required much encouragement to ambulate into hall. Patient reports feel weak and dizzy and not ready to go home. Patient's HR 113. 95 5 on RA. Continue  Mobility.   Follow Up Recommendations  Home health PT     Equipment Recommendations  None recommended by PT    Recommendations for Other Services       Precautions / Restrictions Precautions Precautions: Fall Precaution Comments: BP stability issues    Mobility  Bed Mobility   Bed Mobility: Supine to Sit;Sit to Supine       Sit to supine: Supervision   General bed mobility comments: ppatrient self assisted, extra time to move to sitting. no assist  for return to supine  Transfers Overall transfer level: Needs assistance Equipment used: Rolling walker (2 wheeled) Transfers: Sit to/from Stand Sit to Stand: Min guard         General transfer comment: Min guard assist to steady pt when coming to stand.   Ambulation/Gait Ambulation/Gait assistance: Min assist Gait Distance (Feet): 80 Feet Assistive device: Rolling walker (2 wheeled) Gait Pattern/deviations: Step-through pattern;Shuffle;Trunk flexed     General Gait Details: patient  required encouragement to ambulate in hall. Reported feeling dizzy. HR 113 max.   Stairs             Wheelchair Mobility    Modified Rankin (Stroke Patients Only)       Balance                                            Cognition Arousal/Alertness: Awake/alert Behavior During Therapy: Flat affect                                   General Comments: patient states that she does not want lunch meal, does not dfeel  ready to go home.       Exercises      General Comments        Pertinent Vitals/Pain Pain Assessment: Faces Faces Pain Scale: Hurts a little bit Pain Location: abdomen, left side Pain Descriptors / Indicators: Aching Pain Intervention(s): Monitored during session(requesting patch for left side)    Home Living                      Prior Function            PT Goals (current goals can now be found in the care plan section) Progress towards PT goals: Progressing toward goals    Frequency    Min 5X/week      PT Plan Current plan remains appropriate    Co-evaluation              AM-PAC PT "6 Clicks" Mobility   Outcome Measure  Help needed turning from your back to your side while in a flat bed without using bedrails?: A Little Help needed moving from lying on your back to sitting on the side of a flat bed without using  bedrails?: A Little Help needed moving to and from a bed to a chair (including a wheelchair)?: A Little Help needed standing up from a chair using your arms (e.g., wheelchair or bedside chair)?: A Little Help needed to walk in hospital room?: A Little Help needed climbing 3-5 steps with a railing? : A Lot 6 Click Score: 17    End of Session   Activity Tolerance: Patient limited by fatigue Patient left: in bed;with call bell/phone within reach Nurse Communication: Mobility status(HR .) PT Visit Diagnosis: Unsteadiness on feet (R26.81)     Time: 0174-9449 PT Time Calculation (min) (ACUTE ONLY): 18 min  Charges:  $Gait Training: 8-22 mins                     Blanchard Kelch PT Acute Rehabilitation Services Pager [phone] Office [phone]    Rada Hay 05/27/2018, 4:08 PM

## 2018-05-27 NOTE — Progress Notes (Signed)
PROGRESS NOTE                                                                                                                                                                                                             Patient Demographics:    [name], is a 73 y.o. female, DOB - 21-May-1945, GEX:[id number]  Outpatient Primary MD for the patient is Patient, No Pcp Per    LOS - 4  Chief Complaint  Patient presents with  . Cough  . Weakness       Brief Narrative   Patient is a 73 year old female with history of DM-2, HTN, CVA who presented to the ED with 1 week history of productive cough, generalized weakness-found to be COVID 19+ with lower lobe pneumonia.   Subjective:    [name] still claims she gets dizzy when she walks around-she lives alone-family can check in a once in a while.   Assessment  & Plan :   COVID-19 viral pneumonia: Slowly improving-she is chronically sick appearing-she continues to have malaise and weakness.  Continues to complain of intermittent dizziness mostly upon ambulation.  She lives alone -I have spoken with case management today-she will try to contact home health agency to see if maximum at home and services can be provided-including daily monitoring by a home health aide.  We will give her 1 more day in the hospital-to ensure adequate mobility and that vital signs are stable when she ambulates-her heart rate apparently went up to the 180s yesterday when she ambulated.  If she continues to slowly improve-we will plan to discharge home tomorrow morning.  Orthostatic hypotension: She still has intermittent dizziness-we will recheck orthostatic vital signs today.  Cautiously continue with hydralazine-her other antihypertensives have been held.  Continue thigh-high compression stockings.  Follow.  Hypokalemia: Replete and recheck.  DM-2: CBGs stable-continue with SSI   Deconditioning/weakness/malaise: Suspect secondary to COVID 19 viral pneumonia and orthostatic hypotension.  She is very frail and chronically sick appearing-recheck orthostatic vital signs today as she still complains of dizziness.  Patient lives alone-family is unable to provide 24/7 care as her daughter works.  Have asked case management to maximize home health services-continue to ambulate with nursing staff today-see how she does.  If she exhibits further improvement-we will consider discharge home tomorrow morning.   ABG  Component Value Date/Time   TCO2 23 06/04/2014 0024    COVID-19 Labs  Recent Labs    05/25/18 0355 05/25/18 0415  DDIMER 0.88*  --   CRP  --  7.9*        Component Value Date/Time   BNP 39.8 05/23/2018 2155    No results for input(s): PROBNP in the last 8760 hours.  Condition -guarded  Family Communication: None at bedside-had spoken with the patient's daughter yesterday.  Code Status : Full code  Disposition Plan : Home with home health services-hopefully on 5/6  Consults  :  None  Procedures  :  None  DVT Prophylaxis  :  Lovenox  Lab Results  Component Value Date   PLT 238 05/26/2018    Inpatient Medications  Scheduled Meds: . enoxaparin (LOVENOX) injection  40 mg Subcutaneous Q24H  . famotidine  20 mg Oral Daily  . hydrALAZINE  25 mg Oral TID  . insulin aspart  0-15 Units Subcutaneous TID WC  . insulin aspart  0-5 Units Subcutaneous QHS  . lidocaine  1 patch Transdermal Q24H  . mirabegron ER  25 mg Oral Daily  . potassium chloride  40 mEq Oral Q48H  . vitamin C  500 mg Oral BID  . zinc sulfate  220 mg Oral Daily   Continuous Infusions:  PRN Meds:.albuterol, guaiFENesin-dextromethorphan, phenol  Antibiotics  :    Anti-infectives (From admission, onward)   Start     Dose/Rate Route Frequency Ordered Stop   05/23/18 2230  cefTRIAXone (ROCEPHIN) 1 g in sodium chloride 0.9 % 100 mL IVPB     1 g 200 mL/hr over 30 Minutes  Intravenous  Once 05/23/18 2223 05/23/18 2323   05/23/18 2230  doxycycline (VIBRA-TABS) tablet 100 mg     100 mg Oral  Once 05/23/18 2223 05/23/18 2252       Time Spent in minutes  25   Jeoffrey Massed M.D on 05/27/2018 at 1:00 PM   [facility] -  Office  [phone]   Admit date - 05/23/2018    4    Objective:   Vitals:   05/27/18 0300 05/27/18 0428 05/27/18 0800 05/27/18 1214  BP:  (!) 165/56 (!) 170/70 (!) 152/60  Pulse: 88  87   Resp: (!) 24  (!) 21   Temp:  98.6 F (37 C) 98.6 F (37 C) 99.6 F (37.6 C)  TempSrc:   Oral   SpO2: 95%  96%   Weight:      Height:        Wt Readings from Last 3 Encounters:  05/24/18 70.5 kg  04/18/17 65.8 kg  09/23/16 65.8 kg     Intake/Output Summary (Last 24 hours) at 05/27/2018 1300 Last data filed at 05/27/2018 0400 Gross per 24 hour  Intake 120 ml  Output -  Net 120 ml     Physical Exam  General appearance:Awake, alert, not in any distress.  Chronically sick appearing. Eyes:no scleral icterus. HEENT: Atraumatic and Normocephalic Neck: supple, no JVD. Resp:Good air entry bilaterally,no rales or rhonchi CVS: S1 S2 regular, no murmurs.  GI: Bowel sounds present, Non tender and not distended with no gaurding, rigidity or rebound. Extremities: B/L Lower Ext shows no edema, both legs are warm to touch Neurology:  Non focal Psychiatric: Normal judgment and insight. Normal mood. Musculoskeletal:No digital cyanosis Skin:No Rash, warm and dry Wounds:N/A    Data Review:    CBC Recent Labs  Lab 05/23/18 1806 05/24/18 1000 05/25/18 0355 05/26/18  0417  WBC 5.3 6.3 4.7 5.1  HGB 12.4 11.1* 11.1* 11.2*  HCT 38.6 34.8* 34.9* 35.1*  PLT 208 192 205 238  MCV 91.0 92.8 94.6 92.9  MCH 29.2 29.6 30.1 29.6  MCHC 32.1 31.9 31.8 31.9  RDW 11.9 12.2 12.2 12.2  LYMPHSABS  --  1.4 1.3 1.0  MONOABS  --  0.4 0.4 0.4  EOSABS  --  0.0 0.0 0.0  BASOSABS  --  0.0 0.0 0.0    Chemistries  Recent Labs  Lab 05/23/18 1806  05/23/18 2155 05/24/18 1000 05/25/18 0355 05/26/18 0417 05/27/18 0325  NA 133*  --  137 139 138 140  K 2.9*  --  2.8* 3.7 3.0* 3.0*  CL 83*  --  95* 102 103 102  CO2 34*  --  32 27 27 28   GLUCOSE 435*  --  318* 203* 166* 140*  BUN 15  --  16 11 9  5*  CREATININE 1.30*  --  1.09* 0.91 0.83 0.83  CALCIUM 9.5  --  8.2* 8.0* 7.8* 8.3*  MG  --  1.9  --   --   --   --   AST  --   --   --  23 20  --   ALT  --   --   --  12 11  --   ALKPHOS  --   --   --  108 110  --   BILITOT  --   --   --  0.5 0.6  --    ------------------------------------------------------------------------------------------------------------------ No results for input(s): CHOL, HDL, LDLCALC, TRIG, CHOLHDL, LDLDIRECT in the last 72 hours.  Lab Results  Component Value Date   HGBA1C 6.4 (H) 11/05/2013   ------------------------------------------------------------------------------------------------------------------ No results for input(s): TSH, T4TOTAL, T3FREE, THYROIDAB in the last 72 hours.  Invalid input(s): FREET3  Cardiac Enzymes Recent Labs  Lab 05/23/18 2155  TROPONINI 0.03*   ------------------------------------------------------------------------------------------------------------------    Component Value Date/Time   BNP 39.8 05/23/2018 2155    Micro Results Recent Results (from the past 240 hour(s))  SARS Coronavirus 2 [facility](Hospital order, Performed [facility])     Status: Abnormal   Collection Time: 05/23/18  9:55 PM  Result Value Ref Range Status   SARS Coronavirus 2 POSITIVE (A) NEGATIVE Final    Comment: RESULT CALLED TO, READ BACK BY AND VERIFIED WITH: Pattricia BossR KENNEDY RN [id number] 05/23/18 A BROWNING (NOTE) If result is NEGATIVE SARS-CoV-2 target nucleic acids are NOT DETECTED. The SARS-CoV-2 RNA is generally detectable in upper and lower  respiratory specimens during the acute phase of infection. The lowest  concentration of SARS-CoV-2 viral copies this assay can detect is 250   copies / mL. A negative result does not preclude SARS-CoV-2 infection  and should not be used as the sole basis for treatment or other  patient management decisions.  A negative result may occur with  improper specimen collection / handling, submission of specimen other  than nasopharyngeal swab, presence of viral mutation(s) within the  areas targeted by this assay, and inadequate number of viral copies  (<250 copies / mL). A negative result must be combined with clinical  observations, patient history, and epidemiological information. If result is POSITIVE SARS-CoV-2 target nucleic acids are DETECTED. T he SARS-CoV-2 RNA is generally detectable in upper and lower  respiratory specimens during the acute phase of infection.  Positive  results are indicative of active infection with SARS-CoV-2.  Clinical  correlation with patient history and  other diagnostic information is  necessary to determine patient infection status.  Positive results do  not rule out bacterial infection or co-infection with other viruses. If result is PRESUMPTIVE POSTIVE SARS-CoV-2 nucleic acids MAY BE PRESENT.   A presumptive positive result was obtained on the submitted specimen  and confirmed on repeat testing.  While 2019 novel coronavirus  (SARS-CoV-2) nucleic acids may be present in the submitted sample  additional confirmatory testing may be necessary for epidemiological  and / or clinical management purposes  to differentiate between  SARS-CoV-2 and other Sarbecovirus currently known to infect humans.  If clinically indicated additional testing with an alternate test  methodology [phone]) is  advised. The SARS-CoV-2 RNA is generally  detectable in upper and lower respiratory specimens during the acute  phase of infection. The expected result is Negative. Fact Sheet for Patients:  BoilerBrush.com.cy Fact Sheet for Healthcare Providers: https://pope.com/  This test is not yet approved or cleared by the [place] FDA and has been authorized for detection and/or diagnosis of SARS-CoV-2 by FDA under an Emergency Use Authorization (EUA).  This EUA will remain in effect (meaning this test can be used) for the duration of the COVID-19 declaration under Section 564(b)(1) of the Act, 21 U.S.C. section 360bbb-3(b)(1), unless the authorization is terminated or revoked sooner. Performed at [facility], [address], [place], [place] [address]     Radiology Reports Dg Chest Port 1 View  Result Date: 05/23/2018 CLINICAL DATA:  Cough EXAM: PORTABLE CHEST 1 VIEW COMPARISON:  02/10/2018 chest radiograph. FINDINGS: Stable cardiomediastinal silhouette with normal heart size. No pneumothorax. No pleural effusion. New mild hazy opacity at the right costophrenic angle. No pulmonary edema. IMPRESSION: New mild hazy opacity at the right costophrenic angle, cannot exclude pneumonia. Chest radiograph follow-up advised. Electronically Signed   By: Delbert Phenix M.D.   On: 05/23/2018 21:40

## 2018-05-27 NOTE — Progress Notes (Signed)
Orthostatic vitals taken on patient. No complaint of feeling dizzy or SOB. Patient tolerated well.

## 2018-05-28 LAB — BASIC METABOLIC PANEL
Anion gap: 10 (ref 5–15)
BUN: 8 mg/dL (ref 8–23)
CO2: 26 mmol/L (ref 22–32)
Calcium: 8.6 mg/dL — ABNORMAL LOW (ref 8.9–10.3)
Chloride: 106 mmol/L (ref 98–111)
Creatinine, Ser: 0.92 mg/dL (ref 0.44–1.00)
GFR calc Af Amer: >60 mL/min (ref >60)
GFR calc non Af Amer: >60 mL/min (ref >60)
Glucose, Bld: 150 mg/dL — ABNORMAL HIGH (ref 70–99)
Potassium: 3.6 mmol/L (ref 3.5–5.1)
Sodium: 142 mmol/L (ref 135–145)

## 2018-05-28 LAB — GLUCOSE, CAPILLARY
Glucose-Capillary: 156 mg/dL — ABNORMAL HIGH (ref 70–99)
Glucose-Capillary: 174 mg/dL — ABNORMAL HIGH (ref 70–99)

## 2018-05-28 MED ORDER — AMLODIPINE BESYLATE 5 MG PO TABS
5.0000 mg | ORAL_TABLET | Freq: Every day | ORAL | Status: DC
  Administered 2018-05-28: 5 mg via ORAL
  Filled 2018-05-28: qty 1

## 2018-05-28 MED ORDER — BENZONATATE 100 MG PO CAPS: 100.0000 mg | ORAL_CAPSULE | Freq: Three times a day (TID) | ORAL | 0 refills | Status: AC | PRN

## 2018-05-28 NOTE — Progress Notes (Signed)
AVS reviewed, all questions answered. Public health contract signed. IV removed, patient left via transport.

## 2018-05-28 NOTE — Discharge Instructions (Signed)
Person Under Monitoring Name: [name]  Location: [address] [place] [id number]   Infection Prevention Recommendations for Individuals Confirmed to have, or Being Evaluated for, 2019 Novel Coronavirus (COVID-19) Infection Who Receive Care at Home  Individuals who are confirmed to have, or are being evaluated for, COVID-19 should follow the prevention steps below until a healthcare provider or local or state health department says they can return to normal activities.  Stay home except to get medical care You should restrict activities outside your home, except for getting medical care. Do not go to work, school, or public areas, and do not use public transportation or taxis.  Call ahead before visiting your doctor Before your medical appointment, call the healthcare provider and tell them that you have, or are being evaluated for, COVID-19 infection. This will help the healthcare providers office take steps to keep other people from getting infected. Ask your healthcare provider to call the local or state health department.  Monitor your symptoms Seek prompt medical attention if your illness is worsening (e.g., difficulty breathing). Before going to your medical appointment, call the healthcare provider and tell them that you have, or are being evaluated for, COVID-19 infection. Ask your healthcare provider to call the local or state health department.  Wear a facemask You should wear a facemask that covers your nose and mouth when you are in the same room with other people and when you visit a healthcare provider. People who live with or visit you should also wear a facemask while they are in the same room with you.  Separate yourself from other people in your home As much as possible, you should stay in a different room from other people in your home. Also, you should use a separate bathroom, if available.  Avoid sharing household items You should not share  dishes, drinking glasses, cups, eating utensils, towels, bedding, or other items with other people in your home. After using these items, you should wash them thoroughly with soap and water.  Cover your coughs and sneezes Cover your mouth and nose with a tissue when you cough or sneeze, or you can cough or sneeze into your sleeve. Throw used tissues in a lined trash can, and immediately wash your hands with soap and water for at least 20 seconds or use an alcohol-based hand rub.  Wash your [employer] Wash your hands often and thoroughly with soap and water for at least 20 seconds. You can use an alcohol-based hand sanitizer if soap and water are not available and if your hands are not visibly dirty. Avoid touching your eyes, nose, and mouth with unwashed hands.   Prevention Steps for Caregivers and Household Members of Individuals Confirmed to have, or Being Evaluated for, COVID-19 Infection Being Cared for in the Home  If you live with, or provide care at home for, a person confirmed to have, or being evaluated for, COVID-19 infection please follow these guidelines to prevent infection:  Follow healthcare providers instructions Make sure that you understand and can help the patient follow any healthcare provider instructions for all care.  Provide for the patients basic needs You should help the patient with basic needs in the home and provide support for getting groceries, prescriptions, and other personal needs.  Monitor the patients symptoms If they are getting sicker, call his or her medical provider and tell them that the patient has, or is being evaluated for, COVID-19 infection. This will help the healthcare providers office take  steps to keep other people from getting infected. Ask the healthcare provider to call the local or state health department.  Limit the number of people who have contact with the patient  If possible, have only one caregiver for the patient.  Other  household members should stay in another home or place of residence. If this is not possible, they should stay  in another room, or be separated from the patient as much as possible. Use a separate bathroom, if available.  Restrict visitors who do not have an essential need to be in the home.  Keep older adults, very young children, and other sick people away from the patient Keep older adults, very young children, and those who have compromised immune systems or chronic health conditions away from the patient. This includes people with chronic heart, lung, or kidney conditions, diabetes, and cancer.  Ensure good ventilation Make sure that shared spaces in the home have good air flow, such as from an air conditioner or an opened window, weather permitting.  Wash your hands often  Wash your hands often and thoroughly with soap and water for at least 20 seconds. You can use an alcohol based hand sanitizer if soap and water are not available and if your hands are not visibly dirty.  Avoid touching your eyes, nose, and mouth with unwashed hands.  Use disposable paper towels to dry your hands. If not available, use dedicated cloth towels and replace them when they become wet.  Wear a facemask and gloves  Wear a disposable facemask at all times in the room and gloves when you touch or have contact with the patients blood, body fluids, and/or secretions or excretions, such as sweat, saliva, sputum, nasal mucus, vomit, urine, or feces.  Ensure the mask fits over your nose and mouth tightly, and do not touch it during use.  Throw out disposable facemasks and gloves after using them. Do not reuse.  Wash your hands immediately after removing your facemask and gloves.  If your personal clothing becomes contaminated, carefully remove clothing and launder. Wash your hands after handling contaminated clothing.  Place all used disposable facemasks, gloves, and other waste in a lined container before  disposing them with other household waste.  Remove gloves and wash your hands immediately after handling these items.  Do not share dishes, glasses, or other household items with the patient  Avoid sharing household items. You should not share dishes, drinking glasses, cups, eating utensils, towels, bedding, or other items with a patient who is confirmed to have, or being evaluated for, COVID-19 infection.  After the person uses these items, you should wash them thoroughly with soap and water.  Wash laundry thoroughly  Immediately remove and wash clothes or bedding that have blood, body fluids, and/or secretions or excretions, such as sweat, saliva, sputum, nasal mucus, vomit, urine, or feces, on them.  Wear gloves when handling laundry from the patient.  Read and follow directions on labels of laundry or clothing items and detergent. In general, wash and dry with the warmest temperatures recommended on the label.  Clean all areas the individual has used often  Clean all touchable surfaces, such as counters, tabletops, doorknobs, bathroom fixtures, toilets, phones, keyboards, tablets, and bedside tables, every day. Also, clean any surfaces that may have blood, body fluids, and/or secretions or excretions on them.  Wear gloves when cleaning surfaces the patient has come in contact with.  Use a diluted bleach solution (e.g., dilute bleach with 1 part bleach  and 10 parts water) or a household disinfectant with a label that says EPA-registered for coronaviruses. To make a bleach solution at home, add 1 tablespoon of bleach to 1 quart (4 cups) of water. For a larger supply, add  cup of bleach to 1 gallon (16 cups) of water.  Read labels of cleaning products and follow recommendations provided on product labels. Labels contain instructions for safe and effective use of the cleaning product including precautions you should take when applying the product, such as wearing gloves or eye protection  and making sure you have good ventilation during use of the product.  Remove gloves and wash hands immediately after cleaning.  Monitor yourself for signs and symptoms of illness Caregivers and household members are considered close contacts, should monitor their health, and will be asked to limit movement outside of the home to the extent possible. Follow the monitoring steps for close contacts listed on the symptom monitoring form.   ? If you have additional questions, contact your local health department or call the epidemiologist on call at [phone] (available 24/7). ? This guidance is subject to change. For the most up-to-date guidance from [facility], please refer to their website: [email or web address]          Person Under Monitoring Name: [name]  Location: [address] [place] [address]   CORONAVIRUS DISEASE 2019 (COVID-19) Guidance for Persons Under Investigation You are being tested for the virus that causes coronavirus disease 2019 (COVID-19). Public health actions are necessary to ensure protection of your health and the health of others, and to prevent further spread of infection. COVID-19 is caused by a virus that can cause symptoms, such as fever, cough, and shortness of breath. The primary transmission from person to person is by coughing or sneezing. On February 20, 2018, the World Health Organization announced a [employer] Emergency of International Concern and on February 21, 2018 the U.[place]. Department of Health and Human Services declared a public health emergency. If the virus that causesCOVID-19 spreads in the community, it could have severe public health consequences.  As a person under investigation for COVID-19, the [employer] of [employer] and [employer] of [employer] advises you to adhere to the following guidance until your test results are reported to you. If your test  result is positive, you will receive additional information from your provider and your local health department at that time.   Remain at home until you are cleared by your health provider or public health authorities.   Keep a log of visitors to your home using the form provided. Any visitors to your home must be aware of your isolation status.  If you plan to move to a new address or leave the county, notify the local health department in your county.  Call a doctor or seek care if you have an urgent medical need. Before seeking medical care, call ahead and get instructions from the provider before arriving at the medical office, clinic or hospital. Notify them that you are being tested for the virus that causes COVID-19 so arrangements can be made, as necessary, to prevent transmission to others in the healthcare setting. Next, notify the local health department in your county.  If a medical emergency arises and you need to call 911, inform the first responders that you are being tested for the virus that causes COVID-19. Next, notify the local health department in your county.  Adhere to all guidance set forth by the [place] of patients that is based on guidance from the Center for Disease Control and Prevention with suspected or confirmed COVID-19. It is provided with this guidance for Persons Under Investigation.  Your health and the health of our community are our top priorities. Public Health officials remain available to provide assistance and counseling to you about COVID-19 and compliance with this guidance.  Provider: ____________________________________________________________ Date: ______/_____/_________  By signing below, you acknowledge that you have read and agree to comply with this Guidance for Persons Under Investigation. ______________________________________________________________ Date: ______/_____/_________  WHO DO I  CALL? You can find a list of local health departments here: [email or web address] Health Department: ____________________________________________________________________ Contact Name: ________________________________________________________________________ Telephone: ___________________________________________________________________________  Marice Potter, [place], Communicable Disease Branch COVID-19 Guidance for Persons Under Investigation March 29, 2018

## 2018-05-28 NOTE — Discharge Summary (Signed)
PATIENT DETAILS Name: [name] Age: 73 y.o. Sex: female Date of Birth: [birth year] MRN: [id number]. Admitting Physician: Hillary Bow, DO WUJ:[id number], No Pcp Per  Admit Date: 05/23/2018 Discharge date: 05/28/2018  Recommendations for Outpatient Follow-up:  1. Follow up with PCP in 1-2 weeks 2. Please obtain BMP/CBC in one week   Admitted From:  Home  Disposition: Home with home health services   Home Health: Yes  Equipment/Devices: None  Discharge Condition: Stable  CODE STATUS: FULL CODE  Diet recommendation:  Heart Healthy  Brief Summary: See H&P, Labs, Consult and Test reports for all details in brief, Patient is a 73 year old female with history of DM-2, HTN, CVA who presented to the ED with 1 week history of productive cough, generalized weakness-found to be COVID 19+ with lower lobe pneumonia.  Brief Hospital Course: COVID-19 viral pneumonia:  Slowly improved-continues to have some amount of weakness and malaise but better than how she presented with.  Intermittent dizziness with ambulation has improved as well.  She is now on room air.  She is ambulated yesterday with PT/OT-thinks that she is getting better and is getting towards her baseline.  Wants to go home with home health services.  [facility] arranged by case management.  S  Orthostatic hypotension: -Blood pressure now on the higher side-we will hold HCTZ but resume rest of antihypertensives.  Patient to follow-up with PCP for further optimization.    Hypokalemia: Repleted  Deconditioning/weakness/malaise: Suspect secondary to COVID 19 viral pneumonia and orthostatic hypotension.    Appears frail and chronically sick appearing at baseline-did have some orthostatic issues during this hospital stay.  But ambulated yesterday with physical therapy-she wants to go home-[facility] services arranged, patient's family will also keep a close eye on the patient.      Note-attempted to call patient's daughter on the day of discharge-have left her a voicemail  Procedures/Studies: None  Discharge Diagnoses:  Principal Problem:   Acute respiratory disease due to COVID-19 virus Active Problems:   Essential hypertension   Diabetes mellitus (HCC)   Discharge Instructions:  Activity:  As tolerated with Full fall precautions use walker/cane & assistance as needed  Discharge Instructions    Call MD for:  persistant dizziness or light-headedness   Complete by:  As directed    Call MD for:  temperature >100.4   Complete by:  As directed    Diet - low sodium heart healthy   Complete by:  As directed    Discharge instructions   Complete by:  As directed    Follow with Primary MD IN 1 WEEK  Please get a complete blood count and chemistry panel checked by your Primary MD at your next visit, and again as instructed by your Primary MD.  Get Medicines reviewed and adjusted: Please take all your medications with you for your next visit with your Primary MD  Laboratory/radiological data: Please request your Primary MD to go over all hospital tests and procedure/radiological results at the follow up, please ask your Primary MD to get all Hospital records sent to his/her office.  In some cases, they will be blood work, cultures and biopsy results pending at the time of your discharge. Please request that your primary care M.D. follows up on these results.  Also Note the following: If you experience worsening of your admission symptoms, develop shortness of breath, life threatening emergency, suicidal or homicidal thoughts you must seek medical attention immediately by calling 911 or calling your  MD immediately  if symptoms less severe.  You must read complete instructions/literature along with all the possible adverse reactions/side effects for all the Medicines you take and that have been prescribed to you. Take any new Medicines after you have  completely understood and accpet all the possible adverse reactions/side effects.   Do not drive when taking Pain medications or sleeping medications (Benzodaizepines)  Do not take more than prescribed Pain, Sleep and Anxiety Medications. It is not advisable to combine anxiety,sleep and pain medications without talking with your primary care practitioner  Special Instructions: If you have smoked or chewed Tobacco  in the last 2 yrs please stop smoking, stop any regular Alcohol  and or any Recreational drug use.  Wear Seat belts while driving.  Please note: You were cared for by a hospitalist during your hospital stay. Once you are discharged, your primary care physician will handle any further medical issues. Please note that NO REFILLS for any discharge medications will be authorized once you are discharged, as it is imperative that you return to your primary care physician (or establish a relationship with a primary care physician if you do not have one) for your post hospital discharge needs so that they can reassess your need for medications and monitor your lab values.   ?   Person Under Monitoring Name: [name]  Location: [address] [place] [address]   Infection Prevention Recommendations for Individuals Confirmed to have, or Being Evaluated for, 2019 Novel Coronavirus (COVID-19) Infection Who Receive Care at Home  Individuals who are confirmed to have, or are being evaluated for, COVID-19 should follow the prevention steps below until a healthcare provider or local or state health department says they can return to normal activities.  Stay home except to get medical care You should restrict activities outside your home, except for getting medical care. Do not go to work, school, or public areas, and do not use public transportation or taxis.  Call ahead before visiting your doctor Before your medical appointment, call the healthcare provider and tell them that you  have, or are being evaluated for, COVID-19 infection. This will help the healthcare providers office take steps to keep other people from getting infected. Ask your healthcare provider to call the local or state health department.  Monitor your symptoms Seek prompt medical attention if your illness is worsening (e.g., difficulty breathing). Before going to your medical appointment, call the healthcare provider and tell them that you have, or are being evaluated for, COVID-19 infection. Ask your healthcare provider to call the local or state health department.  Wear a facemask You should wear a facemask that covers your nose and mouth when you are in the same room with other people and when you visit a healthcare provider. People who live with or visit you should also wear a facemask while they are in the same room with you.  Separate yourself from other people in your home As much as possible, you should stay in a different room from other people in your home. Also, you should use a separate bathroom, if available.  Avoid sharing household items You should not share dishes, drinking glasses, cups, eating utensils, towels, bedding, or other items with other people in your home. After using these items, you should wash them thoroughly with soap and water.  Cover your coughs and sneezes Cover your mouth and nose with a tissue when you cough or sneeze, or you can cough or sneeze into your sleeve.  Throw used tissues in a lined trash can, and immediately wash your hands with soap and water for at least 20 seconds or use an alcohol-based hand rub.  Wash your [employer] your hands often and thoroughly with soap and water for at least 20 seconds. You can use an alcohol-based hand sanitizer if soap and water are not available and if your hands are not visibly dirty. Avoid touching your eyes, nose, and mouth with unwashed hands.   Prevention Steps for Caregivers and Household Members  of Individuals Confirmed to have, or Being Evaluated for, COVID-19 Infection Being Cared for in the Home  If you live with, or provide care at home for, a person confirmed to have, or being evaluated for, COVID-19 infection please follow these guidelines to prevent infection:  Follow healthcare providers instructions Make sure that you understand and can help the patient follow any healthcare provider instructions for all care.  Provide for the patients basic needs You should help the patient with basic needs in the home and provide support for getting groceries, prescriptions, and other personal needs.  Monitor the patients symptoms If they are getting sicker, call his or her medical provider and tell them that the patient has, or is being evaluated for, COVID-19 infection. This will help the healthcare providers office take steps to keep other people from getting infected. Ask the healthcare provider to call the local or state health department.  Limit the number of people who have contact with the patient If possible, have only one caregiver for the patient. Other household members should stay in another home or place of residence. If this is not possible, they should stay in another room, or be separated from the patient as much as possible. Use a separate bathroom, if available. Restrict visitors who do not have an essential need to be in the home.  Keep older adults, very young children, and other sick people away from the patient Keep older adults, very young children, and those who have compromised immune systems or chronic health conditions away from the patient. This includes people with chronic heart, lung, or kidney conditions, diabetes, and cancer.  Ensure good ventilation Make sure that shared spaces in the home have good air flow, such as from an air conditioner or an opened window, weather permitting.  Wash your hands often Wash your hands often and thoroughly with  soap and water for at least 20 seconds. You can use an alcohol based hand sanitizer if soap and water are not available and if your hands are not visibly dirty. Avoid touching your eyes, nose, and mouth with unwashed hands. Use disposable paper towels to dry your hands. If not available, use dedicated cloth towels and replace them when they become wet.  Wear a facemask and gloves Wear a disposable facemask at all times in the room and gloves when you touch or have contact with the patients blood, body fluids, and/or secretions or excretions, such as sweat, saliva, sputum, nasal mucus, vomit, urine, or feces.  Ensure the mask fits over your nose and mouth tightly, and do not touch it during use. Throw out disposable facemasks and gloves after using them. Do not reuse. Wash your hands immediately after removing your facemask and gloves. If your personal clothing becomes contaminated, carefully remove clothing and launder. Wash your hands after handling contaminated clothing. Place all used disposable facemasks, gloves, and other waste in a lined container before disposing them with other household waste. Remove gloves and wash your  hands immediately after handling these items.  Do not share dishes, glasses, or other household items with the patient Avoid sharing household items. You should not share dishes, drinking glasses, cups, eating utensils, towels, bedding, or other items with a patient who is confirmed to have, or being evaluated for, COVID-19 infection. After the person uses these items, you should wash them thoroughly with soap and water.  Wash laundry thoroughly Immediately remove and wash clothes or bedding that have blood, body fluids, and/or secretions or excretions, such as sweat, saliva, sputum, nasal mucus, vomit, urine, or feces, on them. Wear gloves when handling laundry from the patient. Read and follow directions on labels of laundry or clothing items and detergent. In general,  wash and dry with the warmest temperatures recommended on the label.  Clean all areas the individual has used often Clean all touchable surfaces, such as counters, tabletops, doorknobs, bathroom fixtures, toilets, phones, keyboards, tablets, and bedside tables, every day. Also, clean any surfaces that may have blood, body fluids, and/or secretions or excretions on them. Wear gloves when cleaning surfaces the patient has come in contact with. Use a diluted bleach solution (e.g., dilute bleach with 1 part bleach and 10 parts water) or a household disinfectant with a label that says EPA-registered for coronaviruses. To make a bleach solution at home, add 1 tablespoon of bleach to 1 quart (4 cups) of water. For a larger supply, add  cup of bleach to 1 gallon (16 cups) of water. Read labels of cleaning products and follow recommendations provided on product labels. Labels contain instructions for safe and effective use of the cleaning product including precautions you should take when applying the product, such as wearing gloves or eye protection and making sure you have good ventilation during use of the product. Remove gloves and wash hands immediately after cleaning.  Monitor yourself for signs and symptoms of illness Caregivers and household members are considered close contacts, should monitor their health, and will be asked to limit movement outside of the home to the extent possible. Follow the monitoring steps for close contacts listed on the symptom monitoring form.   ? If you have additional questions, contact your local health department or call the epidemiologist on call at [phone] (available 24/7). ? This guidance is subject to change. For the most up-to-date guidance from [facility], please refer to their website: TripMetro.huhttps://www.cdc.gov/coronavirus/2019-ncov/hcp/guidance-prevent-spread.html   Increase activity slowly   Complete by:  As directed    MyChart COVID-19 home monitoring program    Complete by:  May 28, 2018    Is the patient willing to use the MyChart Mobile App for home monitoring?:  No     Allergies as of 05/28/2018      Reactions   Codeine Other (See Comments)   REACTION: Hallucinations   Ibuprofen Other (See Comments)   REACTION: Elevated Blood Pressure   Imdur [isosorbide Nitrate] Other (See Comments)   Headache & upset stomach      Medication List    STOP taking these medications   chlorthalidone 25 MG tablet Commonly known as:  HYGROTON   potassium chloride 10 MEQ tablet Commonly known as:  K-DUR     TAKE these medications   amLODipine 10 MG tablet Commonly known as:  NORVASC Take 10 mg by mouth daily.   benzonatate 100 MG capsule Commonly known as:  Tessalon Perles Take 1 capsule (100 mg total) by mouth 3 (three) times daily as needed for cough.   cloNIDine 0.2 MG tablet Commonly  known as:  CATAPRES Take 0.2 mg by mouth 2 (two) times daily.   hydrALAZINE 25 MG tablet Commonly known as:  APRESOLINE Take 1 tablet (25 mg total) by mouth 3 (three) times daily.   Myrbetriq 25 MG Tb24 tablet Generic drug:  mirabegron ER Take 25 mg by mouth daily.   Ventolin HFA 108 (90 Base) MCG/ACT inhaler Generic drug:  albuterol Inhale 2 puffs into the lungs every 6 (six) hours as needed for shortness of breath.      Follow-up Information    [facility], [facility] Follow up.   Specialty:  Home Health Services Why:  Home Health Registered Nurse, Physical and Occupational Therapy Contact information: [address] [place] [address] [phone]        Primary Care MD. Schedule an appointment as soon as possible for a visit in 1 week(s).          Allergies  Allergen Reactions   Codeine Other (See Comments)    REACTION: Hallucinations   Ibuprofen Other (See Comments)    REACTION: Elevated Blood Pressure   Imdur [Isosorbide Nitrate] Other (See Comments)    Headache & upset stomach    Consultations:    None   Other Procedures/Studies: Dg Chest Port 1 View  Result Date: 05/23/2018 CLINICAL DATA:  Cough EXAM: PORTABLE CHEST 1 VIEW COMPARISON:  02/10/2018 chest radiograph. FINDINGS: Stable cardiomediastinal silhouette with normal heart size. No pneumothorax. No pleural effusion. New mild hazy opacity at the right costophrenic angle. No pulmonary edema. IMPRESSION: New mild hazy opacity at the right costophrenic angle, cannot exclude pneumonia. Chest radiograph follow-up advised. Electronically Signed   By: Delbert Phenix M.D.   On: 05/23/2018 21:40      TODAY-DAY OF DISCHARGE:  Subjective:   [name] today has no headache,no chest abdominal pain,no new weakness tingling or numbness, feels much better wants to go home today.   Objective:   Blood pressure (!) 170/55, pulse 93, temperature 98.4 F (36.9 C), temperature source Oral, resp. rate (!) 21, height  (1.626 m), weight 70.6 kg, SpO2 96 %.  Intake/Output Summary (Last 24 hours) at 05/28/2018 1012 Last data filed at 05/28/2018 0600 Gross per 24 hour  Intake 120 ml  Output --  Net 120 ml   Filed Weights   05/24/18 0456 05/28/18 0437  Weight: 70.5 kg 70.6 kg    Exam: Awake Alert, Oriented *3, No new F.N deficits, Normal affect [place].AT,PERRAL Supple Neck,No JVD, No cervical lymphadenopathy appriciated.  Symmetrical Chest wall movement, Good air movement bilaterally, CTAB RRR,No Gallops,Rubs or new Murmurs, No Parasternal Heave +ve B.Sounds, Abd Soft, Non tender, No organomegaly appriciated, No rebound -guarding or rigidity. No Cyanosis, Clubbing or edema, No new Rash or bruise   PERTINENT RADIOLOGIC STUDIES: Dg Chest Port 1 View  Result Date: 05/23/2018 CLINICAL DATA:  Cough EXAM: PORTABLE CHEST 1 VIEW COMPARISON:  02/10/2018 chest radiograph. FINDINGS: Stable cardiomediastinal silhouette with normal heart size. No pneumothorax. No pleural effusion. New mild hazy opacity at the right costophrenic angle. No pulmonary edema.  IMPRESSION: New mild hazy opacity at the right costophrenic angle, cannot exclude pneumonia. Chest radiograph follow-up advised. Electronically Signed   By: Delbert Phenix M.D.   On: 05/23/2018 21:40     PERTINENT LAB RESULTS: CBC: Recent Labs    05/26/18 0417  WBC 5.1  HGB 11.2*  HCT 35.1*  PLT 238   CMET CMP     Component Value Date/Time   NA 142 05/28/2018 0500  K 3.6 05/28/2018 0500   CL 106 05/28/2018 0500   CO2 26 05/28/2018 0500   GLUCOSE 150 (H) 05/28/2018 0500   BUN 8 05/28/2018 0500   CREATININE 0.92 05/28/2018 0500   CALCIUM 8.6 (L) 05/28/2018 0500   PROT 7.2 05/26/2018 0417   ALBUMIN 2.9 (L) 05/26/2018 0417   AST 20 05/26/2018 0417   ALT 11 05/26/2018 0417   ALKPHOS 110 05/26/2018 0417   BILITOT 0.6 05/26/2018 0417   GFRNONAA >60 05/28/2018 0500   GFRAA >60 05/28/2018 0500    GFR Estimated Creatinine Clearance: 54.1 mL/min (by C-G formula based on SCr of 0.92 mg/dL). No results for input(s): LIPASE, AMYLASE in the last 72 hours. No results for input(s): CKTOTAL, CKMB, CKMBINDEX, TROPONINI in the last 72 hours. Invalid input(s): POCBNP No results for input(s): DDIMER in the last 72 hours. No results for input(s): HGBA1C in the last 72 hours. No results for input(s): CHOL, HDL, LDLCALC, TRIG, CHOLHDL, LDLDIRECT in the last 72 hours. No results for input(s): TSH, T4TOTAL, T3FREE, THYROIDAB in the last 72 hours.  Invalid input(s): FREET3 No results for input(s): VITAMINB12, FOLATE, FERRITIN, TIBC, IRON, RETICCTPCT in the last 72 hours. Coags: No results for input(s): INR in the last 72 hours.  Invalid input(s): PT Microbiology: Recent Results (from the past 240 hour(s))  SARS Coronavirus 2 [facility] order, Performed in [facility] hospital lab)     Status: Abnormal   Collection Time: 05/23/18  9:55 PM  Result Value Ref Range Status   SARS Coronavirus 2 POSITIVE (A) NEGATIVE Final    Comment: RESULT CALLED TO, READ BACK BY AND VERIFIED WITH: Pattricia Boss  RN 2308 05/23/18 A BROWNING (NOTE) If result is NEGATIVE SARS-CoV-2 target nucleic acids are NOT DETECTED. The SARS-CoV-2 RNA is generally detectable in upper and lower  respiratory specimens during the acute phase of infection. The lowest  concentration of SARS-CoV-2 viral copies this assay can detect is 250  copies / mL. A negative result does not preclude SARS-CoV-2 infection  and should not be used as the sole basis for treatment or other  patient management decisions.  A negative result may occur with  improper specimen collection / handling, submission of specimen other  than nasopharyngeal swab, presence of viral mutation(s) within the  areas targeted by this assay, and inadequate number of viral copies  (<250 copies / mL). A negative result must be combined with clinical  observations, patient history, and epidemiological information. If result is POSITIVE SARS-CoV-2 target nucleic acids are DETECTED. T he SARS-CoV-2 RNA is generally detectable in upper and lower  respiratory specimens during the acute phase of infection.  Positive  results are indicative of active infection with SARS-CoV-2.  Clinical  correlation with patient history and other diagnostic information is  necessary to determine patient infection status.  Positive results do  not rule out bacterial infection or co-infection with other viruses. If result is PRESUMPTIVE POSTIVE SARS-CoV-2 nucleic acids MAY BE PRESENT.   A presumptive positive result was obtained on the submitted specimen  and confirmed on repeat testing.  While 2019 novel coronavirus  (SARS-CoV-2) nucleic acids may be present in the submitted sample  additional confirmatory testing may be necessary for epidemiological  and / or clinical management purposes  to differentiate between  SARS-CoV-2 and other Sarbecovirus currently known to infect humans.  If clinically indicated additional testing with an alternate test  methodology [id number]) is   advised. The SARS-CoV-2 RNA is generally  detectable in upper and lower  respiratory specimens during the acute  phase of infection. The expected result is Negative. Fact Sheet for Patients:  BoilerBrush.com.cy Fact Sheet for Healthcare Providers: https://pope.com/ This test is not yet approved or cleared by the [place] FDA and has been authorized for detection and/or diagnosis of SARS-CoV-2 by FDA under an Emergency Use Authorization (EUA).  This EUA will remain in effect (meaning this test can be used) for the duration of the COVID-19 declaration under Section 564(b)(1) of the Act, 21 U.S.C. section 360bbb-3(b)(1), unless the authorization is terminated or revoked sooner. Performed at [facility], [address] [place], [place] [address]     FURTHER DISCHARGE INSTRUCTIONS:  Get Medicines reviewed and adjusted: Please take all your medications with you for your next visit with your Primary MD  Laboratory/radiological data: Please request your Primary MD to go over all hospital tests and procedure/radiological results at the follow up, please ask your Primary MD to get all Hospital records sent to his/her office.  In some cases, they will be blood work, cultures and biopsy results pending at the time of your discharge. Please request that your primary care M.D. goes through all the records of your hospital data and follows up on these results.  Also Note the following: If you experience worsening of your admission symptoms, develop shortness of breath, life threatening emergency, suicidal or homicidal thoughts you must seek medical attention immediately by calling 911 or calling your MD immediately  if symptoms less severe.  You must read complete instructions/literature along with all the possible adverse reactions/side effects for all the Medicines you take and that have been prescribed to you. Take any new Medicines after  you have completely understood and accpet all the possible adverse reactions/side effects.   Do not drive when taking Pain medications or sleeping medications (Benzodaizepines)  Do not take more than prescribed Pain, Sleep and Anxiety Medications. It is not advisable to combine anxiety,sleep and pain medications without talking with your primary care practitioner  Special Instructions: If you have smoked or chewed Tobacco  in the last 2 yrs please stop smoking, stop any regular Alcohol  and or any Recreational drug use.  Wear Seat belts while driving.  Please note: You were cared for by a hospitalist during your hospital stay. Once you are discharged, your primary care physician will handle any further medical issues. Please note that NO REFILLS for any discharge medications will be authorized once you are discharged, as it is imperative that you return to your primary care physician (or establish a relationship with a primary care physician if you do not have one) for your post hospital discharge needs so that they can reassess your need for medications and monitor your lab values.  Total Time spent coordinating discharge including counseling, education and face to face time equals 35 minutes.  Signed: Mackinley Kiehn 05/28/2018 10:12 AM

## 2018-05-28 NOTE — TOC Transition Note (Signed)
Transition of Care [facility]) - CM/SW Discharge Note   Patient Details  Name: [name] MRN: [id number] Date of Birth: April 21, 1945  Transition of Care [facility]) CM/SW Contact:  Colleen Can RN, BSN, NCM-BC, ACM-RN [phone] Phone Number: 05/28/2018, 12:12 PM   Clinical Narrative:    CM following for dispositional needs. Patient is medically stable to transition home. HH arranged with [facility]; Lorenza Chick RN, liaison has been updated on patients discharge. Patient will require transportation home with [facility] arranged and scheduled for pickup. No further needs from CM.     Barriers to Discharge: No Barriers Identified   Patient Goals and CMS Choice Patient states their goals for this hospitalization and ongoing recovery are:: "to feel better" CMS Medicare.gov Compare Post Acute Care list provided to:: Patient Choice offered to / list presented to : Patient   Discharge Plan and Services In-house Referral: NA Discharge Planning Services: CM Consult, Other - See comment(Following for possible transportation home) Post Acute Care Choice: Home Health          DME Arranged: N/A DME Agency: NA       HH Arranged: RN, Disease Management, PT, OT HH Agency: [facility] Date [facility] Agency Contacted: 05/26/18 Time HH Agency Contacted: 1513 Representative spoke with at [facility] Agency: Lorenza Chick RN liaison

## 2018-06-02 NOTE — Telephone Encounter (Signed)
This encounter was created in error - please disregard.

## 2018-06-23 ENCOUNTER — Telehealth: Payer: Self-pay | Admitting: *Deleted

## 2018-06-23 NOTE — Telephone Encounter (Signed)
Attempted to call pt to request plasma donation however no answer and no voicemail.

## 2019-04-11 ENCOUNTER — Emergency Department (HOSPITAL_COMMUNITY)
Admission: EM | Admit: 2019-04-11 | Discharge: 2019-04-11 | Disposition: A | Discharge status: Home / Self Care | Payer: Medicare HMO | Attending: Emergency Medicine | Admitting: Emergency Medicine

## 2019-04-11 ENCOUNTER — Other Ambulatory Visit: Payer: Self-pay

## 2019-04-11 ENCOUNTER — Emergency Department (HOSPITAL_COMMUNITY): Payer: Medicare HMO

## 2019-04-11 DIAGNOSIS — R519 Headache, unspecified: Secondary | ICD-10-CM | POA: Diagnosis not present

## 2019-04-11 DIAGNOSIS — R739 Hyperglycemia, unspecified: Secondary | ICD-10-CM

## 2019-04-11 DIAGNOSIS — I639 Cerebral infarction, unspecified: Secondary | ICD-10-CM | POA: Diagnosis not present

## 2019-04-11 DIAGNOSIS — Z79899 Other long term (current) drug therapy: Secondary | ICD-10-CM | POA: Insufficient documentation

## 2019-04-11 DIAGNOSIS — E1165 Type 2 diabetes mellitus with hyperglycemia: Secondary | ICD-10-CM | POA: Insufficient documentation

## 2019-04-11 DIAGNOSIS — J0140 Acute pansinusitis, unspecified: Secondary | ICD-10-CM

## 2019-04-11 DIAGNOSIS — I10 Essential (primary) hypertension: Secondary | ICD-10-CM | POA: Diagnosis present

## 2019-04-11 LAB — BASIC METABOLIC PANEL
Anion gap: 10 (ref 5–15)
BUN: 14 mg/dL (ref 8–23)
CO2: 31 mmol/L (ref 22–32)
Calcium: 8.8 mg/dL — ABNORMAL LOW (ref 8.9–10.3)
Chloride: 92 mmol/L — ABNORMAL LOW (ref 98–111)
Creatinine, Ser: 1.17 mg/dL — ABNORMAL HIGH (ref 0.44–1.00)
GFR calc Af Amer: 54 mL/min — ABNORMAL LOW (ref >60)
GFR calc non Af Amer: 47 mL/min — ABNORMAL LOW (ref >60)
Glucose, Bld: 443 mg/dL — ABNORMAL HIGH (ref 70–99)
Potassium: 3.5 mmol/L (ref 3.5–5.1)
Sodium: 133 mmol/L — ABNORMAL LOW (ref 135–145)

## 2019-04-11 LAB — CBC WITH DIFFERENTIAL/PLATELET
Abs Immature Granulocytes: 0.01 10*3/uL (ref 0.00–0.07)
Basophils Absolute: 0 10*3/uL (ref 0.0–0.1)
Basophils Relative: 0 %
Eosinophils Absolute: 0.1 10*3/uL (ref 0.0–0.5)
Eosinophils Relative: 1 %
HCT: 35 % — ABNORMAL LOW (ref 36.0–46.0)
Hemoglobin: 11.3 g/dL — ABNORMAL LOW (ref 12.0–15.0)
Immature Granulocytes: 0 %
Lymphocytes Relative: 12 %
Lymphs Abs: 1 10*3/uL (ref 0.7–4.0)
MCH: 29.5 pg (ref 26.0–34.0)
MCHC: 32.3 g/dL (ref 30.0–36.0)
MCV: 91.4 fL (ref 80.0–100.0)
Monocytes Absolute: 0.6 10*3/uL (ref 0.1–1.0)
Monocytes Relative: 8 %
Neutro Abs: 6.6 10*3/uL (ref 1.7–7.7)
Neutrophils Relative %: 79 %
Platelets: 274 10*3/uL (ref 150–400)
RBC: 3.83 MIL/uL — ABNORMAL LOW (ref 3.87–5.11)
RDW: 12.7 % (ref 11.5–15.5)
WBC: 8.3 10*3/uL (ref 4.0–10.5)
nRBC: 0 % (ref 0.0–0.2)

## 2019-04-11 LAB — CBG MONITORING, ED: Glucose-Capillary: 308 mg/dL — ABNORMAL HIGH (ref 70–99)

## 2019-04-11 MED ORDER — METFORMIN HCL 500 MG PO TABS: 500.0000 mg | ORAL_TABLET | Freq: Every day | ORAL | 0 refills | Status: DC

## 2019-04-11 MED ORDER — GLUCOSE BLOOD VI STRP: ORAL_STRIP | 12 refills | Status: DC

## 2019-04-11 MED ORDER — FENTANYL CITRATE (PF) 100 MCG/2ML IJ SOLN
50.0000 ug | Freq: Once | INTRAMUSCULAR | Status: DC
  Filled 2019-04-11: qty 2

## 2019-04-11 MED ORDER — DOXYCYCLINE HYCLATE 100 MG PO CAPS: 100.0000 mg | ORAL_CAPSULE | Freq: Two times a day (BID) | ORAL | 0 refills | Status: DC

## 2019-04-11 MED ORDER — SODIUM CHLORIDE 0.9 % IV BOLUS
500.0000 mL | Freq: Once | INTRAVENOUS | Status: AC
  Administered 2019-04-11: 500 mL via INTRAVENOUS

## 2019-04-11 MED ORDER — FENTANYL CITRATE (PF) 100 MCG/2ML IJ SOLN
50.0000 ug | Freq: Once | INTRAMUSCULAR | Status: AC
  Administered 2019-04-11: 50 ug via INTRAVENOUS
  Filled 2019-04-11: qty 2

## 2019-04-11 NOTE — ED Notes (Signed)
PT reports eating bacon today and now her BP is high. Pt tearful  While telling story about eating bacon and going to  [employer] to get her BP checked. Pt was instructed by [facility] [employer] staff to come to ed to get checked.

## 2019-04-11 NOTE — ED Triage Notes (Signed)
Pt reports her BP has been high and she now has a HA. Pt denies any vision changes.

## 2019-04-11 NOTE — Discharge Instructions (Addendum)
Take doxycycline as prescribed.  Recommend Flonase daily. Take Metformin once daily as prescribed.  Monitor your blood sugar.  Take your blood sugar readings to your primary care provider for follow-up.

## 2019-04-11 NOTE — ED Provider Notes (Signed)
[facility] EMERGENCY DEPARTMENT Provider Note   CSN: [id number] Arrival date & time: 04/11/19  1733     History Chief Complaint  Patient presents with  . Hypertension    [name] is a 74 y.o. female.  74 year old female with past medical history of diabetes, hypertension, CVA, Covid (May 2020) presents with complaint of headache.  Patient is unclear what time today she developed sudden onset global headache, checked her blood pressure which was elevated, went to [facility] and checked her blood pressure again and was found to be 170 systolic.  Patient discussed with the pharmacist who recommended she come to the emergency room.  Patient insists she has her headache because she ate bacon today which likely caused her blood pressure to be high.  Patient reports compliance with her blood pressure medication, blood pressure currently is 156/68, reports global headache with pain in her eyes, states she has never had a headache like this before.  Denies changes in vision, speech, gait, unilateral weakness or numbness, chest pain.  No other complaints or concerns today.        Past Medical History:  Diagnosis Date  . Diabetes mellitus without complication (HCC)   . GERD 09/04/2006   Qualifier: Diagnosis of  By: Duke Salvia    . History of echocardiogram    a. 2D ECHO: 11/06/2013 EF 65%; no WMA. Mild TR. PA pk pressure 43 mm HG  . Hypertension   . Stroke [facility])     Patient Active Problem List   Diagnosis Date Noted  . Acute respiratory disease due to COVID-19 virus 05/23/2018  . NSTEMI (non-ST elevated myocardial infarction) (HCC) 11/05/2013  . Spinal stenosis of cervical region 10/20/2012  . Prolonged QT interval 10/18/2012  . Left sided numbness 10/18/2012  . Palpitations 10/18/2012  . 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 Numbness 06/20/2012  . Diabetes mellitus (HCC) 06/20/2012  . CVA (cerebral infarction) 06/20/2012  . Hypokalemia 06/20/2012    . ARTHRALGIA 01/28/2009  . CHEST PAIN UNSPECIFIED 11/26/2008  . DEPRESSION 09/04/2006  . Essential hypertension 09/04/2006  . GERD 09/04/2006  . MULTIPLE SITES, ARTHRITIS-ACUTE/CHRONIC 09/04/2006  . LOW BACK PAIN, CHRONIC 09/04/2006    Past Surgical History:  Procedure Laterality Date  . ABDOMINAL HYSTERECTOMY    . LEFT HEART CATHETERIZATION WITH CORONARY ANGIOGRAM N/A 11/05/2013   Procedure: LEFT HEART CATHETERIZATION WITH CORONARY ANGIOGRAM;  Surgeon: Lennette Bihari, MD;  Location: [facility] CATH LAB;  Service: Cardiovascular;  Laterality: N/A;     OB History   No obstetric history on file.     No family history on file.  Social History   Tobacco Use  . Smoking status: Never Smoker  . Smokeless tobacco: Never Used  Substance Use Topics  . Alcohol use: No  . Drug use: No    Home Medications Prior to Admission medications   Medication Sig Start Date End Date Taking? Authorizing Provider  amLODipine (NORVASC) 10 MG tablet Take 10 mg by mouth daily.    [provider]  benzonatate (TESSALON PERLES) 100 MG capsule Take 1 capsule (100 mg total) by mouth 3 (three) times daily as needed for cough. 05/28/18 05/28/19  Ghimire, Werner Lean, MD  chlorthalidone (HYGROTON) 25 MG tablet Take 25 mg by mouth daily. 03/16/19   [provider]  cloNIDine (CATAPRES) 0.2 MG tablet Take 0.2 mg by mouth 2 (two) times daily.    [provider]  clopidogrel (PLAVIX) 75 MG tablet Take 75 mg by mouth daily.  03/16/19   [provider]  doxycycline (VIBRAMYCIN) 100 MG capsule Take 1 capsule (100 mg total) by mouth 2 (two) times daily. 04/11/19   Suella Broad A, PA-C  glucose blood test strip Use as instructed 04/11/19   Suella Broad A, PA-C  hydrALAZINE (APRESOLINE) 25 MG tablet Take 1 tablet (25 mg total) by mouth 3 (three) times daily. 02/10/18   Charlesetta Shanks, MD  hydrALAZINE (APRESOLINE) 50 MG tablet Take 50 mg by mouth 2 (two) times daily. 03/22/19   [provider]  lidocaine (LIDODERM) 5 % PLEASE SEE ATTACHED FOR DETAILED DIRECTIONS 03/16/19   [provider]  losartan (COZAAR) 50 MG tablet Take 50 mg by mouth daily. 03/16/19   [provider]  metFORMIN (GLUCOPHAGE) 500 MG tablet Take 1 tablet (500 mg total) by mouth daily with breakfast for 7 days. 04/11/19 04/18/19  Tacy Learn, PA-C  MYRBETRIQ 25 MG TB24 tablet Take 25 mg by mouth daily. 02/03/18   [provider]  potassium chloride (MICRO-K) 10 MEQ CR capsule Take 10 mEq by mouth daily. 03/16/19   [provider]  rosuvastatin (CRESTOR) 10 MG tablet Take 10 mg by mouth at bedtime. 03/22/19   [provider]  VENTOLIN HFA 108 (90 Base) MCG/ACT inhaler Inhale 2 puffs into the lungs every 6 (six) hours as needed for shortness of breath. 01/31/18   [provider]    Allergies    Codeine, Ibuprofen, and Imdur [isosorbide nitrate]  Review of Systems   Review of Systems  Constitutional: Negative for fever.  Eyes: Positive for pain. Negative for photophobia and visual disturbance.  Respiratory: Negative for shortness of breath.   Cardiovascular: Negative for chest pain and leg swelling.  Gastrointestinal: Negative for abdominal pain, nausea and vomiting.  Musculoskeletal: Negative for neck pain and neck stiffness.  Skin: Negative for rash and wound.  Allergic/Immunologic: Positive for immunocompromised state.  Neurological: Positive for headaches. Negative for dizziness, facial asymmetry, speech difficulty and weakness.  Psychiatric/Behavioral: Negative for confusion.  All other systems reviewed and are negative.   Physical Exam Updated Vital Signs BP (!) 185/78   Pulse 67   Temp 98.3 F (36.8 C) (Oral)   Resp 19   Ht 5\' 4"  (1.626 m)   SpO2 98%   BMI 26.72 kg/m   Physical Exam Vitals and nursing note reviewed.  Constitutional:      General: She is not in acute distress.    Appearance: She is well-developed. She is not diaphoretic.    HENT:     Head: Normocephalic and atraumatic.     Nose: Nose normal.     Mouth/Throat:     Mouth: Mucous membranes are moist.  Eyes:     Extraocular Movements: Extraocular movements intact.     Pupils: Pupils are equal, round, and reactive to light.  Cardiovascular:     Rate and Rhythm: Normal rate and regular rhythm.     Pulses: Normal pulses.     Heart sounds: Normal heart sounds.  Pulmonary:     Effort: Pulmonary effort is normal.     Breath sounds: Normal breath sounds.  Abdominal:     Palpations: Abdomen is soft.     Tenderness: There is no abdominal tenderness.  Musculoskeletal:     Cervical back: Neck supple.     Right lower leg: No edema.     Left lower leg: No edema.  Skin:    General: Skin is warm and dry.  Findings: No erythema or rash.  Neurological:     Mental Status: She is alert and oriented to person, place, and time.     Cranial Nerves: No cranial nerve deficit.     Sensory: No sensory deficit.     Motor: No weakness.  Psychiatric:        Behavior: Behavior normal.     ED Results / Procedures / Treatments   Labs (all labs ordered are listed, but only abnormal results are displayed) Labs Reviewed  BASIC METABOLIC PANEL - Abnormal; Notable for the following components:      Result Value   Sodium 133 (*)    Chloride 92 (*)    Glucose, Bld 443 (*)    Creatinine, Ser 1.17 (*)    Calcium 8.8 (*)    GFR calc non Af Amer 47 (*)    GFR calc Af Amer 54 (*)    All other components within normal limits  CBC WITH DIFFERENTIAL/PLATELET - Abnormal; Notable for the following components:   RBC 3.83 (*)    Hemoglobin 11.3 (*)    HCT 35.0 (*)    All other components within normal limits  CBG MONITORING, ED - Abnormal; Notable for the following components:   Glucose-Capillary 308 (*)    All other components within normal limits    EKG EKG Interpretation  Date/Time:  Saturday April 11 2019 18:05:11 EDT Ventricular Rate:  81 PR Interval:    QRS  Duration: 78 QT Interval:  393 QTC Calculation: 457 R Axis:   44 Text Interpretation: Sinus rhythm Borderline T wave abnormalities Artifact Abnormal ECG Confirmed by Gerhard Munch [phone]) on 04/11/2019 6:18:39 PM   Radiology CT Head Wo Contrast  Result Date: 04/11/2019 CLINICAL DATA:  Headache. EXAM: CT HEAD WITHOUT CONTRAST TECHNIQUE: Contiguous axial images were obtained from the base of the skull through the vertex without intravenous contrast. COMPARISON:  February 10, 2018 FINDINGS: Brain: No evidence of acute infarction, hemorrhage, hydrocephalus, extra-axial collection or mass lesion/mass effect. Vascular: No hyperdense vessel or unexpected calcification. Skull: Normal. Negative for fracture or focal lesion. Sinuses/Orbits: There is complete opacification of the left maxillary sinus. There is opacification of the left ethmoid air cells and left frontal sinus. The remaining paranasal sinuses and mastoid air cells are essentially clear. Other: None. IMPRESSION: 1. No acute intracranial abnormality. 2. Left-sided sinus disease as above. Electronically Signed   By: Katherine Mantle M.D.   On: 04/11/2019 19:02    Procedures Procedures (including critical care time)  Medications Ordered in ED Medications  fentaNYL (SUBLIMAZE) injection 50 mcg (50 mcg Intravenous Given 04/11/19 2104)  sodium chloride 0.9 % bolus 500 mL (500 mLs Intravenous Bolus 04/11/19 2111)    ED Course  I have reviewed the triage vital signs and the nursing notes.  Pertinent labs & imaging results that were available during my care of the patient were reviewed by me and considered in my medical decision making (see chart for details).  Clinical Course as of Apr 11 2218  Sat Apr 11, 2019  2207 73yo female presents with complaint of diffuse head pain today. Patient believes her high blood pressure caused her headache, attributes her high blood pressure to eating bacon today. Denies CP, visual disturbance, unilateral  weakness or numbness. Exam unremarkable.  Labs with elevated glucose at 443, slight increase in Cr. Patient was given IV fluids, blood sugar has improved to 308. Headache has improved. CT head shows left side sinusitis, will treat with doxycycline. Patient is upset  her blood sugar is high today, unsure why her PCP discontinued her unknown blood sugar medication unknown amount of time ago. Review or records, previously prescribed Metformin 500mg  with glipizide 5mg  at bedtime. Will give short course of Metformin 500mg  QD with strips for her glucometer to monitor her blood sugar and follow up with PCP for further treatment/recheck.    [LM]    Clinical Course User Index [LM]   MDM Rules/Calculators/A&P                      Final Clinical Impression(s) / ED Diagnoses Final diagnoses:  Nonintractable episodic headache, unspecified headache type  Acute non-recurrent pansinusitis  Hyperglycemia    Rx / DC Orders ED Discharge Orders         Ordered    doxycycline (VIBRAMYCIN) 100 MG capsule  2 times daily     04/11/19 2153    metFORMIN (GLUCOPHAGE) 500 MG tablet  Daily with breakfast     04/11/19 2153    glucose blood test strip     04/11/19 2153           2154 04/11/19 2221    Alden Hipp, MD 04/14/19 2340

## 2019-04-11 NOTE — ED Notes (Signed)
PT Hx of stroke NIH done

## 2019-09-22 ENCOUNTER — Encounter (HOSPITAL_COMMUNITY): Payer: Self-pay | Admitting: Emergency Medicine

## 2019-09-22 ENCOUNTER — Emergency Department (HOSPITAL_COMMUNITY)
Admission: EM | Admit: 2019-09-22 | Discharge: 2019-09-23 | Disposition: A | Discharge status: Home / Self Care | Payer: Medicare HMO | Attending: Emergency Medicine | Admitting: Emergency Medicine

## 2019-09-22 DIAGNOSIS — Z8616 Personal history of COVID-19: Secondary | ICD-10-CM | POA: Insufficient documentation

## 2019-09-22 DIAGNOSIS — Z7984 Long term (current) use of oral hypoglycemic drugs: Secondary | ICD-10-CM | POA: Insufficient documentation

## 2019-09-22 DIAGNOSIS — R519 Headache, unspecified: Secondary | ICD-10-CM | POA: Diagnosis present

## 2019-09-22 DIAGNOSIS — I10 Essential (primary) hypertension: Secondary | ICD-10-CM | POA: Insufficient documentation

## 2019-09-22 DIAGNOSIS — Z79899 Other long term (current) drug therapy: Secondary | ICD-10-CM | POA: Diagnosis not present

## 2019-09-22 DIAGNOSIS — E119 Type 2 diabetes mellitus without complications: Secondary | ICD-10-CM | POA: Diagnosis not present

## 2019-09-22 LAB — BASIC METABOLIC PANEL
Anion gap: 6 (ref 5–15)
BUN: 16 mg/dL (ref 8–23)
CO2: 34 mmol/L — ABNORMAL HIGH (ref 22–32)
Calcium: 9 mg/dL (ref 8.9–10.3)
Chloride: 94 mmol/L — ABNORMAL LOW (ref 98–111)
Creatinine, Ser: 1.09 mg/dL — ABNORMAL HIGH (ref 0.44–1.00)
GFR calc Af Amer: 59 mL/min — ABNORMAL LOW (ref >60)
GFR calc non Af Amer: 51 mL/min — ABNORMAL LOW (ref >60)
Glucose, Bld: 245 mg/dL — ABNORMAL HIGH (ref 70–99)
Potassium: 3.1 mmol/L — ABNORMAL LOW (ref 3.5–5.1)
Sodium: 134 mmol/L — ABNORMAL LOW (ref 135–145)

## 2019-09-22 LAB — CBC
HCT: 35.8 % — ABNORMAL LOW (ref 36.0–46.0)
Hemoglobin: 11.4 g/dL — ABNORMAL LOW (ref 12.0–15.0)
MCH: 30.1 pg (ref 26.0–34.0)
MCHC: 31.8 g/dL (ref 30.0–36.0)
MCV: 94.5 fL (ref 80.0–100.0)
Platelets: 270 10*3/uL (ref 150–400)
RBC: 3.79 MIL/uL — ABNORMAL LOW (ref 3.87–5.11)
RDW: 13.2 % (ref 11.5–15.5)
WBC: 8 10*3/uL (ref 4.0–10.5)
nRBC: 0 % (ref 0.0–0.2)

## 2019-09-22 NOTE — ED Triage Notes (Signed)
BIB EMS from PCP. Patient reports HA and generalized body aches X several days. Patient initially hypertensive with EMS, last BP 120's systolic.  Had COVID in May, NOT vaccinated

## 2019-09-23 ENCOUNTER — Other Ambulatory Visit: Payer: Self-pay

## 2019-09-23 ENCOUNTER — Emergency Department (HOSPITAL_COMMUNITY): Payer: Medicare HMO

## 2019-09-23 DIAGNOSIS — R519 Headache, unspecified: Secondary | ICD-10-CM | POA: Diagnosis not present

## 2019-09-23 LAB — URINALYSIS, ROUTINE W REFLEX MICROSCOPIC
Bilirubin Urine: NEGATIVE
Glucose, UA: NEGATIVE mg/dL
Hgb urine dipstick: NEGATIVE
Ketones, ur: NEGATIVE mg/dL
Leukocytes,Ua: NEGATIVE
Nitrite: NEGATIVE
Protein, ur: NEGATIVE mg/dL
Specific Gravity, Urine: 1.006 (ref 1.005–1.030)
pH: 7 (ref 5.0–8.0)

## 2019-09-23 LAB — CBG MONITORING, ED: Glucose-Capillary: 183 mg/dL — ABNORMAL HIGH (ref 70–99)

## 2019-09-23 MED ORDER — HYDRALAZINE HCL 20 MG/ML IJ SOLN
10.0000 mg | Freq: Once | INTRAMUSCULAR | Status: AC
  Administered 2019-09-23: 10 mg via INTRAVENOUS
  Filled 2019-09-23: qty 1

## 2019-09-23 MED ORDER — ACETAMINOPHEN 500 MG PO TABS
1000.0000 mg | ORAL_TABLET | Freq: Once | ORAL | Status: AC
  Administered 2019-09-23: 1000 mg via ORAL
  Filled 2019-09-23: qty 2

## 2019-09-23 MED ORDER — CLONIDINE HCL 0.2 MG PO TABS
0.2000 mg | ORAL_TABLET | Freq: Once | ORAL | Status: AC
  Administered 2019-09-23: 0.2 mg via ORAL
  Filled 2019-09-23: qty 1

## 2019-09-23 MED ORDER — FENTANYL CITRATE (PF) 100 MCG/2ML IJ SOLN
50.0000 ug | Freq: Once | INTRAMUSCULAR | Status: AC
  Administered 2019-09-23: 50 ug via INTRAVENOUS
  Filled 2019-09-23: qty 2

## 2019-09-23 MED ORDER — LACTATED RINGERS IV BOLUS
1000.0000 mL | Freq: Once | INTRAVENOUS | Status: AC
  Administered 2019-09-23: 1000 mL via INTRAVENOUS

## 2019-09-23 MED ORDER — AMLODIPINE BESYLATE 5 MG PO TABS
10.0000 mg | ORAL_TABLET | Freq: Once | ORAL | Status: AC
  Administered 2019-09-23: 10 mg via ORAL
  Filled 2019-09-23: qty 2

## 2019-09-23 NOTE — ED Notes (Signed)
Urine culture sent down with u/a 

## 2019-09-23 NOTE — ED Notes (Signed)
Patient back from CT.

## 2019-09-23 NOTE — ED Notes (Signed)
Patient transported to CT 

## 2019-09-24 NOTE — ED Provider Notes (Signed)
[facility] EMERGENCY DEPARTMENT Provider Note   CSN: [id number] Arrival date & time: 09/22/19  1613     History Chief Complaint  Patient presents with  . Headache  . Generalized Body Aches    [name] is a 74 y.o. female.   Headache Pain location:  Generalized Quality:  Dull Radiates to:  Does not radiate Severity currently:  5/10 Severity at highest:  5/10 Onset quality:  Gradual Timing:  Constant Progression:  Worsening Chronicity:  New Similar to prior headaches: yes   Relieved by:  None tried      Past Medical History:  Diagnosis Date  . Diabetes mellitus without complication (HCC)   . GERD 09/04/2006   Qualifier: Diagnosis of  By: Duke Salvia    . History of echocardiogram    a. 2D ECHO: 11/06/2013 EF 65%; no WMA. Mild TR. PA pk pressure 43 mm HG  . Hypertension   . Stroke [facility])     Patient Active Problem List   Diagnosis Date Noted  . Acute respiratory disease due to COVID-19 virus 05/23/2018  . NSTEMI (non-ST elevated myocardial infarction) (HCC) 11/05/2013  . Spinal stenosis of cervical region 10/20/2012  . Prolonged QT interval 10/18/2012  . Left sided numbness 10/18/2012  . Palpitations 10/18/2012  . 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 Numbness 06/20/2012  . Diabetes mellitus (HCC) 06/20/2012  . CVA (cerebral infarction) 06/20/2012  . Hypokalemia 06/20/2012  . ARTHRALGIA 01/28/2009  . CHEST PAIN UNSPECIFIED 11/26/2008  . DEPRESSION 09/04/2006  . Essential hypertension 09/04/2006  . GERD 09/04/2006  . MULTIPLE SITES, ARTHRITIS-ACUTE/CHRONIC 09/04/2006  . LOW BACK PAIN, CHRONIC 09/04/2006    Past Surgical History:  Procedure Laterality Date  . ABDOMINAL HYSTERECTOMY    . LEFT HEART CATHETERIZATION WITH CORONARY ANGIOGRAM N/A 11/05/2013   Procedure: LEFT HEART CATHETERIZATION WITH CORONARY ANGIOGRAM;  Surgeon: Lennette Bihari, MD;  Location: [facility] CATH LAB;  Service: Cardiovascular;  Laterality: N/A;      OB History   No obstetric history on file.     No family history on file.  Social History   Tobacco Use  . Smoking status: Never Smoker  . Smokeless tobacco: Never Used  Substance Use Topics  . Alcohol use: No  . Drug use: No    Home Medications Prior to Admission medications   Medication Sig Start Date End Date Taking? Authorizing Provider  amLODipine (NORVASC) 10 MG tablet Take 10 mg by mouth daily.    [provider]  chlorthalidone (HYGROTON) 25 MG tablet Take 25 mg by mouth daily. 03/16/19   [provider]  cloNIDine (CATAPRES) 0.2 MG tablet Take 0.2 mg by mouth 2 (two) times daily.    [provider]  clopidogrel (PLAVIX) 75 MG tablet Take 75 mg by mouth daily. 03/16/19   [provider]  doxycycline (VIBRAMYCIN) 100 MG capsule Take 1 capsule (100 mg total) by mouth 2 (two) times daily. 04/11/19   Army Melia A, PA-C  glucose blood test strip Use as instructed 04/11/19   Army Melia A, PA-C  hydrALAZINE (APRESOLINE) 25 MG tablet Take 1 tablet (25 mg total) by mouth 3 (three) times daily. 02/10/18   Arby Barrette, MD  hydrALAZINE (APRESOLINE) 50 MG tablet Take 50 mg by mouth 2 (two) times daily. 03/22/19   [provider]  lidocaine (LIDODERM) 5 % PLEASE SEE ATTACHED FOR DETAILED DIRECTIONS 03/16/19   [provider]  losartan (COZAAR) 50 MG tablet Take 50 mg by mouth daily.  03/16/19   [provider]  metFORMIN (GLUCOPHAGE) 500 MG tablet Take 1 tablet (500 mg total) by mouth daily with breakfast for 7 days. 04/11/19 04/18/19  [name], PA-C  MYRBETRIQ 25 MG TB24 tablet Take 25 mg by mouth daily. 02/03/18   [provider]  potassium chloride (MICRO-K) 10 MEQ CR capsule Take 10 mEq by mouth daily. 03/16/19   [provider]  rosuvastatin (CRESTOR) 10 MG tablet Take 10 mg by mouth at bedtime. 03/22/19   [provider]  VENTOLIN HFA 108 (90 Base) MCG/ACT inhaler Inhale 2 puffs into  the lungs every 6 (six) hours as needed for shortness of breath. 01/31/18   [provider]    Allergies    Codeine, Ibuprofen, and Imdur [isosorbide nitrate]  Review of Systems   Review of Systems  Neurological: Positive for headaches.  All other systems reviewed and are negative.   Physical Exam Updated Vital Signs BP (!) 162/63 (BP Location: Right Arm)   Pulse 79   Temp 98 F (36.7 C) (Oral)   Resp 20   Ht 5\' 4"  (1.626 m)   Wt 68 kg   SpO2 98%   BMI 25.75 kg/m   Physical Exam Vitals and nursing note reviewed.  Constitutional:      Appearance: She is well-developed.  HENT:     Head: Normocephalic and atraumatic.  Cardiovascular:     Rate and Rhythm: Normal rate and regular rhythm.  Pulmonary:     Effort: No respiratory distress.     Breath sounds: No stridor.  Abdominal:     General: There is no distension.  Musculoskeletal:     Cervical back: Normal range of motion.  Neurological:     Mental Status: She is alert and oriented to person, place, and time. Mental status is at baseline.     Cranial Nerves: No cranial nerve deficit or dysarthria.     Motor: No weakness.     ED Results / Procedures / Treatments   Labs (all labs ordered are listed, but only abnormal results are displayed) Labs Reviewed  BASIC METABOLIC PANEL - Abnormal; Notable for the following components:      Result Value   Sodium 134 (*)    Potassium 3.1 (*)    Chloride 94 (*)    CO2 34 (*)    Glucose, Bld 245 (*)    Creatinine, Ser 1.09 (*)    GFR calc non Af Amer 51 (*)    GFR calc Af Amer 59 (*)    All other components within normal limits  CBC - Abnormal; Notable for the following components:   RBC 3.79 (*)    Hemoglobin 11.4 (*)    HCT 35.8 (*)    All other components within normal limits  CBG MONITORING, ED - Abnormal; Notable for the following components:   Glucose-Capillary 183 (*)    All other components within normal limits  URINALYSIS, ROUTINE W REFLEX  MICROSCOPIC    EKG None  Radiology CT Head Wo Contrast  Result Date: 09/23/2019 CLINICAL DATA:  Headache and generalized body aches. Cerebral hemorrhage suspected EXAM: CT HEAD WITHOUT CONTRAST TECHNIQUE: Contiguous axial images were obtained from the base of the skull through the vertex without intravenous contrast. COMPARISON:  04/11/2019 FINDINGS: Brain: No evidence of acute infarction, hemorrhage, hydrocephalus, extra-axial collection or mass lesion/mass effect. Vascular: No hyperdense vessel or unexpected calcification. Skull: Normal. Negative for fracture or focal lesion. Sinuses/Orbits: Completely opacified left maxillary sinus with sclerotic  wall thickening, chronic. There is also left ethmoid and inferior frontal opacification with narrowing of the left middle meatus. IMPRESSION: 1. Normal appearance of the brain. 2. 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 Electronically Signed   By: Marnee Spring M.D.   On: 09/23/2019 05:57    Procedures Procedures (including critical care time)  Medications Ordered in ED Medications  hydrALAZINE (APRESOLINE) injection 10 mg (10 mg Intravenous Given 09/23/19 0558)  lactated ringers bolus 1,000 mL (0 mLs Intravenous Stopped 09/23/19 0723)  acetaminophen (TYLENOL) tablet 1,000 mg (1,000 mg Oral Given 09/23/19 0558)  fentaNYL (SUBLIMAZE) injection 50 mcg (50 mcg Intravenous Given 09/23/19 0719)  cloNIDine (CATAPRES) tablet 0.2 mg (0.2 mg Oral Given 09/23/19 0719)  amLODipine (NORVASC) tablet 10 mg (10 mg Oral Given 09/23/19 0719)    ED Course  I have reviewed the triage vital signs and the nursing notes.  Pertinent labs & imaging results that were available during my care of the patient were reviewed by me and considered in my medical decision making (see chart for details).    MDM Rules/Calculators/A&P                          Headache and BP improved with medications (mostly just home BP meds) workup negative aside  from chronic sinusitis. No indication for further workup or management in hospital/ED. Stable for dc w/ outpatient follow up.  Final Clinical Impression(s) / ED Diagnoses Final diagnoses:  Hypertension, unspecified type    Rx / DC Orders ED Discharge Orders    None       Aletheia Tangredi, Barbara Cower, MD 09/24/19 [phone]

## 2019-09-29 ENCOUNTER — Emergency Department (HOSPITAL_COMMUNITY): Payer: Medicare HMO

## 2019-09-29 ENCOUNTER — Emergency Department (HOSPITAL_COMMUNITY)
Admission: EM | Admit: 2019-09-29 | Discharge: 2019-09-30 | Disposition: A | Discharge status: Home / Self Care | Payer: Medicare HMO | Attending: Emergency Medicine | Admitting: Emergency Medicine

## 2019-09-29 ENCOUNTER — Encounter (HOSPITAL_COMMUNITY): Payer: Self-pay

## 2019-09-29 DIAGNOSIS — Z7984 Long term (current) use of oral hypoglycemic drugs: Secondary | ICD-10-CM | POA: Diagnosis not present

## 2019-09-29 DIAGNOSIS — Z79899 Other long term (current) drug therapy: Secondary | ICD-10-CM | POA: Diagnosis not present

## 2019-09-29 DIAGNOSIS — M25512 Pain in left shoulder: Secondary | ICD-10-CM | POA: Insufficient documentation

## 2019-09-29 DIAGNOSIS — Z8616 Personal history of COVID-19: Secondary | ICD-10-CM | POA: Diagnosis not present

## 2019-09-29 DIAGNOSIS — R079 Chest pain, unspecified: Secondary | ICD-10-CM | POA: Diagnosis not present

## 2019-09-29 DIAGNOSIS — I10 Essential (primary) hypertension: Secondary | ICD-10-CM | POA: Insufficient documentation

## 2019-09-29 DIAGNOSIS — E119 Type 2 diabetes mellitus without complications: Secondary | ICD-10-CM | POA: Insufficient documentation

## 2019-09-29 DIAGNOSIS — Z9861 Coronary angioplasty status: Secondary | ICD-10-CM | POA: Insufficient documentation

## 2019-09-29 LAB — BASIC METABOLIC PANEL
Anion gap: 11 (ref 5–15)
BUN: 12 mg/dL (ref 8–23)
CO2: 28 mmol/L (ref 22–32)
Calcium: 8.8 mg/dL — ABNORMAL LOW (ref 8.9–10.3)
Chloride: 97 mmol/L — ABNORMAL LOW (ref 98–111)
Creatinine, Ser: 1.2 mg/dL — ABNORMAL HIGH (ref 0.44–1.00)
GFR calc Af Amer: 52 mL/min — ABNORMAL LOW (ref >60)
GFR calc non Af Amer: 45 mL/min — ABNORMAL LOW (ref >60)
Glucose, Bld: 323 mg/dL — ABNORMAL HIGH (ref 70–99)
Potassium: 3.3 mmol/L — ABNORMAL LOW (ref 3.5–5.1)
Sodium: 136 mmol/L (ref 135–145)

## 2019-09-29 LAB — CBC
HCT: 35.3 % — ABNORMAL LOW (ref 36.0–46.0)
Hemoglobin: 11.4 g/dL — ABNORMAL LOW (ref 12.0–15.0)
MCH: 30.7 pg (ref 26.0–34.0)
MCHC: 32.3 g/dL (ref 30.0–36.0)
MCV: 95.1 fL (ref 80.0–100.0)
Platelets: 272 10*3/uL (ref 150–400)
RBC: 3.71 MIL/uL — ABNORMAL LOW (ref 3.87–5.11)
RDW: 13.2 % (ref 11.5–15.5)
WBC: 8.8 10*3/uL (ref 4.0–10.5)
nRBC: 0 % (ref 0.0–0.2)

## 2019-09-29 LAB — TROPONIN I (HIGH SENSITIVITY)
Troponin I (High Sensitivity): 6 ng/L (ref <18)
Troponin I (High Sensitivity): 5 ng/L (ref <18)

## 2019-09-29 NOTE — ED Triage Notes (Signed)
Pt reports right shoulder pain and chest pain after a fall about 1 month ago. Pt denies any sob. Pt a.o

## 2019-09-30 ENCOUNTER — Encounter (HOSPITAL_COMMUNITY): Payer: Self-pay | Admitting: Emergency Medicine

## 2019-09-30 DIAGNOSIS — M25512 Pain in left shoulder: Secondary | ICD-10-CM | POA: Diagnosis not present

## 2019-09-30 MED ORDER — DICLOFENAC SODIUM 1 % EX GEL: 4.0000 g | Freq: Four times a day (QID) | CUTANEOUS | 0 refills | Status: DC

## 2019-09-30 MED ORDER — LIDOCAINE 5 % EX PTCH
2.0000 | MEDICATED_PATCH | CUTANEOUS | Status: DC
  Administered 2019-09-30: 2 via TRANSDERMAL
  Filled 2019-09-30: qty 2

## 2019-09-30 MED ORDER — LIDOCAINE 5 % EX PTCH: 1.0000 | MEDICATED_PATCH | CUTANEOUS | 0 refills | Status: DC

## 2019-09-30 NOTE — ED Notes (Signed)
Patient Alert and oriented to baseline. Stable and ambulatory to baseline. Patient verbalized understanding of the discharge instructions.  Patient belongings were taken by the patient.   

## 2019-09-30 NOTE — ED Provider Notes (Signed)
[facility]ENC[facility] Provider Note   CSN: [id number] Arrival date & time: 09/29/19  1715     History Chief Complaint  Patient presents with  . Chest Pain  . Shoulder Pain    [name] is a 74 y.o. female.  The history is provided by the patient.  Shoulder Pain Location:  Shoulder Shoulder location:  L shoulder Injury: yes (fall )   Time since incident:  1 month Mechanism of injury: fall   Fall:    Fall occurred:  Standing   Impact surface:  Hard floor   Point of impact: left shoulder, also has neck pain    Entrapped after fall: no   Pain details:    Quality:  Aching   Radiates to:  Does not radiate   Severity:  Moderate   Onset quality:  Sudden   Duration:  1 month   Timing:  Constant Handedness:  Right-handed Foreign body present:  No foreign bodies Prior injury to area:  No Relieved by:  Nothing Worsened by:  Nothing Ineffective treatments:  None tried Associated symptoms: neck pain   Associated symptoms: no back pain, no fever and no muscle weakness   Risk factors: no concern for non-accidental trauma   No chest pain, no shortness of breath.  No n/v/d. No exertional symptoms.  Patient states arm and neck feel sore and are stiff since the fall.      Past Medical History:  Diagnosis Date  . Diabetes mellitus without complication (HCC)   . GERD 09/04/2006   Qualifier: Diagnosis of  By: Duke SalviaGore, Denise    . History of echocardiogram    a. 2D ECHO: 11/06/2013 EF 65%; no WMA. Mild TR. PA pk pressure 43 mm HG  . Hypertension   . Stroke [facility](HCC)     Patient Active Problem List   Diagnosis Date Noted  . Acute respiratory disease due to COVID-19 virus 05/23/2018  . NSTEMI (non-ST elevated myocardial infarction) (HCC) 11/05/2013  . Spinal stenosis of cervical region 10/20/2012  . Prolonged QT interval 10/18/2012  . Left sided numbness 10/18/2012  . Palpitations 10/18/2012  . 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  Numbness 06/20/2012  . Diabetes mellitus (HCC) 06/20/2012  . CVA (cerebral infarction) 06/20/2012  . Hypokalemia 06/20/2012  . ARTHRALGIA 01/28/2009  . CHEST PAIN UNSPECIFIED 11/26/2008  . DEPRESSION 09/04/2006  . Essential hypertension 09/04/2006  . GERD 09/04/2006  . MULTIPLE SITES, ARTHRITIS-ACUTE/CHRONIC 09/04/2006  . LOW BACK PAIN, CHRONIC 09/04/2006    Past Surgical History:  Procedure Laterality Date  . ABDOMINAL HYSTERECTOMY    . LEFT HEART CATHETERIZATION WITH CORONARY ANGIOGRAM N/A 11/05/2013   Procedure: LEFT HEART CATHETERIZATION WITH CORONARY ANGIOGRAM;  Surgeon: Lennette Biharihomas A Kelly, MD;  [facility];  Service: Cardiovascular;  Laterality: N/A;     OB History   No obstetric history on file.     History reviewed. No pertinent family history.  Social History   Tobacco Use  . Smoking status: Never Smoker  . Smokeless tobacco: Never Used  Substance Use Topics  . Alcohol use: No  . Drug use: No    Home Medications Prior to Admission medications   Medication Sig Start Date End Date Taking? Authorizing Provider  amLODipine (NORVASC) 10 MG tablet Take 10 mg by mouth daily.    [provider]  chlorthalidone (HYGROTON) 25 MG tablet Take 25 mg by mouth daily. 03/16/19   [provider]  cloNIDine (CATAPRES) 0.2 MG tablet Take 0.2  mg by mouth 2 (two) times daily.    [provider]  clopidogrel (PLAVIX) 75 MG tablet Take 75 mg by mouth daily. 03/16/19   [provider]  doxycycline (VIBRAMYCIN) 100 MG capsule Take 1 capsule (100 mg total) by mouth 2 (two) times daily. 04/11/19   Army Melia A, PA-C  glucose blood test strip Use as instructed 04/11/19   Army Melia A, PA-C  hydrALAZINE (APRESOLINE) 25 MG tablet Take 1 tablet (25 mg total) by mouth 3 (three) times daily. 02/10/18   Arby Barrette, MD  hydrALAZINE (APRESOLINE) 50 MG tablet Take 50 mg by mouth 2 (two) times daily. 03/22/19   [provider]  lidocaine  (LIDODERM) 5 % PLEASE SEE ATTACHED FOR DETAILED DIRECTIONS 03/16/19   [provider]  losartan (COZAAR) 50 MG tablet Take 50 mg by mouth daily. 03/16/19   [provider]  metFORMIN (GLUCOPHAGE) 500 MG tablet Take 1 tablet (500 mg total) by mouth daily with breakfast for 7 days. 04/11/19 04/18/19  [name], PA-C  MYRBETRIQ 25 MG TB24 tablet Take 25 mg by mouth daily. 02/03/18   [provider]  potassium chloride (MICRO-K) 10 MEQ CR capsule Take 10 mEq by mouth daily. 03/16/19   [provider]  rosuvastatin (CRESTOR) 10 MG tablet Take 10 mg by mouth at bedtime. 03/22/19   [provider]  VENTOLIN HFA 108 (90 Base) MCG/ACT inhaler Inhale 2 puffs into the lungs every 6 (six) hours as needed for shortness of breath. 01/31/18   [provider]    Allergies    Codeine, Ibuprofen, and Imdur [isosorbide nitrate]  Review of Systems   Review of Systems  Constitutional: Negative for fever.  HENT: Negative for congestion.   Respiratory: Negative for shortness of breath.   Cardiovascular: Negative for chest pain, palpitations and leg swelling.  Gastrointestinal: Negative for abdominal pain.  Genitourinary: Negative for difficulty urinating.  Musculoskeletal: Positive for arthralgias and neck pain. Negative for back pain.  Neurological: Negative for dizziness.  Psychiatric/Behavioral: Negative for agitation.  All other systems reviewed and are negative.   Physical Exam Updated Vital Signs BP (!) 158/71 (BP Location: Right Arm)   Pulse 67   Temp 98 F (36.7 C) (Oral)   Resp 18   SpO2 97%   Physical Exam Vitals and nursing note reviewed.  Constitutional:      General: She is not in acute distress.    Appearance: Normal appearance.  HENT:     Head: Normocephalic and atraumatic.     Nose: Nose normal.  Eyes:     Conjunctiva/sclera: Conjunctivae normal.     Pupils: Pupils are equal, round, and reactive to light.  Cardiovascular:      Rate and Rhythm: Normal rate and regular rhythm.     Pulses: Normal pulses.     Heart sounds: Normal heart sounds.  Pulmonary:     Effort: Pulmonary effort is normal.     Breath sounds: Normal breath sounds.  Abdominal:     General: Abdomen is flat. Bowel sounds are normal.     Palpations: Abdomen is soft.     Tenderness: There is no abdominal tenderness. There is no guarding or rebound.  Musculoskeletal:        General: Normal range of motion.     Cervical back: Normal range of motion and neck supple.  Skin:    General: Skin is warm and dry.     Capillary Refill: Capillary refill takes less than 2  seconds.  Neurological:     General: No focal deficit present.     Mental Status: She is alert and oriented to person, place, and time.     Deep Tendon Reflexes: Reflexes normal.  Psychiatric:        Mood and Affect: Mood normal.        Behavior: Behavior normal.     ED Results / Procedures / Treatments   Labs (all labs ordered are listed, but only abnormal results are displayed) Results for orders placed or performed during the hospital encounter of 09/29/19  Basic metabolic panel  Result Value Ref Range   Sodium 136 135 - 145 mmol/L   Potassium 3.3 (L) 3.5 - 5.1 mmol/L   Chloride 97 (L) 98 - 111 mmol/L   CO2 28 22 - 32 mmol/L   Glucose, Bld 323 (H) 70 - 99 mg/dL   BUN 12 8 - 23 mg/dL   Creatinine, Ser 2.45 (H) 0.44 - 1.00 mg/dL   Calcium 8.8 (L) 8.9 - 10.3 mg/dL   GFR calc non Af Amer 45 (L) >60 mL/min   GFR calc Af Amer 52 (L) >60 mL/min   Anion gap 11 5 - 15  CBC  Result Value Ref Range   WBC 8.8 4.0 - 10.5 K/uL   RBC 3.71 (L) 3.87 - 5.11 MIL/uL   Hemoglobin 11.4 (L) 12.0 - 15.0 g/dL   HCT 80.9 (L) 36 - 46 %   MCV 95.1 80.0 - 100.0 fL   MCH 30.7 26.0 - 34.0 pg   MCHC 32.3 30.0 - 36.0 g/dL   RDW 98.3 38.2 - 50.5 %   Platelets 272 150 - 400 K/uL   nRBC 0.0 0.0 - 0.2 %  Troponin I (High Sensitivity)  Result Value Ref Range   Troponin I (High Sensitivity) 6 <18 ng/L   Troponin I (High Sensitivity)  Result Value Ref Range   Troponin I (High Sensitivity) 5 <18 ng/L   DG Chest 2 View  Result Date: 09/29/2019 CLINICAL DATA:  Left chest and shoulder pain, fell EXAM: CHEST - 2 VIEW COMPARISON:  05/23/2018 FINDINGS: Frontal and lateral views of the chest demonstrate an unremarkable cardiac silhouette. No airspace disease, effusion, or pneumothorax. No acute displaced fracture. IMPRESSION: 1. No acute intrathoracic process. Electronically Signed   By: Sharlet Salina M.D.   On: 09/29/2019 17:58   CT Head Wo Contrast  Result Date: 09/23/2019 CLINICAL DATA:  Headache and generalized body aches. Cerebral hemorrhage suspected EXAM: CT HEAD WITHOUT CONTRAST TECHNIQUE: Contiguous axial images were obtained from the base of the skull through the vertex without intravenous contrast. COMPARISON:  04/11/2019 FINDINGS: Brain: No evidence of acute infarction, hemorrhage, hydrocephalus, extra-axial collection or mass lesion/mass effect. Vascular: No hyperdense vessel or unexpected calcification. Skull: Normal. Negative for fracture or focal lesion. Sinuses/Orbits: Completely opacified left maxillary sinus with sclerotic wall thickening, chronic. There is also left ethmoid and inferior frontal opacification with narrowing of the left middle meatus. IMPRESSION: 1. Normal appearance of the brain. 2. 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 Electronically Signed   By: Marnee Spring M.D.   On: 09/23/2019 05:57    EKG EKG Interpretation  Date/Time:  Tuesday September 29 2019 17:21:23 EDT Ventricular Rate:  78 PR Interval:  124 QRS Duration: 70 QT Interval:  370 QTC Calculation: 421 R Axis:   51 Text Interpretation: Normal sinus rhythm with sinus arrhythmia Confirmed by Priscella Donna ([id number]) on 09/30/2019 5:55:58 AM   Radiology DG Chest  2 View  Result Date: 09/29/2019 CLINICAL DATA:  Left chest and shoulder pain, fell EXAM: CHEST - 2 VIEW  COMPARISON:  05/23/2018 FINDINGS: Frontal and lateral views of the chest demonstrate an unremarkable cardiac silhouette. No airspace disease, effusion, or pneumothorax. No acute displaced fracture. IMPRESSION: 1. No acute intrathoracic process. Electronically Signed   By: Sharlet Salina M.D.   On: 09/29/2019 17:58    Procedures Procedures (including critical care time)  Medications Ordered in ED Medications  lidocaine (LIDODERM) 5 % 2 patch (has no administration in time range)    ED Course  I have reviewed the triage vital signs and the nursing notes.  Pertinent labs & imaging results that were available during my care of the patient were reviewed by me and considered in my medical decision making (see chart for details).  Pain is clearly MSK in nature and hurts to palpate and range the joints.  The patient has ruled out for MI in the ed.  I will start lidoderm and voltaren gel  [name] was evaluated in Emergency Department on 09/30/2019 for the symptoms described in the history of present illness. She was evaluated in the context of the global COVID-19 pandemic, which necessitated consideration that the patient might be at risk for infection with the SARS-CoV-2 virus that causes COVID-19. Institutional protocols and algorithms that pertain to the evaluation of patients at risk for COVID-19 are in a state of rapid change based on information released by regulatory bodies including the CDC and federal and state organizations. These policies and algorithms were followed during the patient's care in the ED.  Final Clinical Impression(s) / ED Diagnoses Return for intractable cough, coughing up blood,fevers >100.4 unrelieved by medication, shortness of breath, intractable vomiting, chest pain, shortness of breath, weakness,numbness, changes in speech, facial asymmetry,abdominal pain, passing out,Inability to tolerate liquids or food, cough, altered mental status or any concerns. No signs  of systemic illness or infection. The patient is nontoxic-appearing on exam and vital signs are within normal limits.   I have reviewed the triage vital signs and the nursing notes. Pertinent labs &imaging results that were available during my care of the patient were reviewed by me and considered in my medical decision making (see chart for details).After history, exam, and medical workup I feel the patient has beenappropriately medically screened and is safe for discharge home. Pertinent diagnoses were discussed with the patient. Patient was given return precautions.   Yousof Alderman, MD 09/30/19 [phone]

## 2019-10-05 DIAGNOSIS — E119 Type 2 diabetes mellitus without complications: Secondary | ICD-10-CM | POA: Insufficient documentation

## 2019-10-05 DIAGNOSIS — Z7984 Long term (current) use of oral hypoglycemic drugs: Secondary | ICD-10-CM | POA: Insufficient documentation

## 2019-10-05 DIAGNOSIS — I509 Heart failure, unspecified: Secondary | ICD-10-CM | POA: Diagnosis not present

## 2019-10-05 DIAGNOSIS — Z79899 Other long term (current) drug therapy: Secondary | ICD-10-CM | POA: Diagnosis not present

## 2019-10-05 DIAGNOSIS — M25512 Pain in left shoulder: Secondary | ICD-10-CM | POA: Insufficient documentation

## 2019-10-05 DIAGNOSIS — R0789 Other chest pain: Secondary | ICD-10-CM | POA: Diagnosis not present

## 2019-10-05 DIAGNOSIS — I110 Hypertensive heart disease with heart failure: Secondary | ICD-10-CM | POA: Insufficient documentation

## 2019-10-05 NOTE — ED Triage Notes (Signed)
Pt transported from home by EMS for HTN, HA compliant with meds. A &O

## 2019-10-06 ENCOUNTER — Emergency Department (HOSPITAL_COMMUNITY)
Admission: EM | Admit: 2019-10-06 | Discharge: 2019-10-06 | Disposition: A | Discharge status: Home / Self Care | Payer: Medicare HMO | Attending: Emergency Medicine | Admitting: Emergency Medicine

## 2019-10-06 ENCOUNTER — Encounter (HOSPITAL_COMMUNITY): Payer: Self-pay | Admitting: Emergency Medicine

## 2019-10-06 ENCOUNTER — Emergency Department (HOSPITAL_COMMUNITY): Payer: Medicare HMO

## 2019-10-06 DIAGNOSIS — M25512 Pain in left shoulder: Secondary | ICD-10-CM

## 2019-10-06 MED ORDER — ACETAMINOPHEN 325 MG PO TABS
650.0000 mg | ORAL_TABLET | Freq: Once | ORAL | Status: DC
  Filled 2019-10-06: qty 2

## 2019-10-06 MED ORDER — METHOCARBAMOL 500 MG PO TABS: 500.0000 mg | ORAL_TABLET | Freq: Three times a day (TID) | ORAL | 0 refills | Status: DC | PRN

## 2019-10-06 MED ORDER — PREDNISONE 20 MG PO TABS: 20.0000 mg | ORAL_TABLET | Freq: Every day | ORAL | 0 refills | Status: DC

## 2019-10-06 NOTE — ED Provider Notes (Addendum)
[facility] EMERGENCY DEPARTMENT Provider Note   CSN: [id number] Arrival date & time: 10/05/19  2205     History Chief Complaint  Patient presents with  . Hypertension    [name] is a 74 y.o. female.  HPI Patient presents with left shoulder and left chest pain.  Reported began after a fall at [facility].  States patch on her shoulder is not helping.  Worse with movements.  Had around a 15-hour wait to be seen by me.  Patient is somewhat difficult to get a history from.  She states she fell at [facility] near her house but not exactly sure when.  Seen in the ER a week ago for the same.  The note at that time said that the fall occurred a month ago.  Patient cannot quite tell me when tapping any is easy it was.    Past Medical History:  Diagnosis Date  . Diabetes mellitus without complication (HCC)   . GERD 09/04/2006   Qualifier: Diagnosis of  By: Duke Salvia    . History of echocardiogram    a. 2D ECHO: 11/06/2013 EF 65%; no WMA. Mild TR. PA pk pressure 43 mm HG  . Hypertension   . Stroke [facility])     Patient Active Problem List   Diagnosis Date Noted  . Acute respiratory disease due to COVID-19 virus 05/23/2018  . NSTEMI (non-ST elevated myocardial infarction) (HCC) 11/05/2013  . Spinal stenosis of cervical region 10/20/2012  . Prolonged QT interval 10/18/2012  . Left sided numbness 10/18/2012  . Palpitations 10/18/2012  . 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 Numbness 06/20/2012  . Diabetes mellitus (HCC) 06/20/2012  . CVA (cerebral infarction) 06/20/2012  . Hypokalemia 06/20/2012  . ARTHRALGIA 01/28/2009  . CHEST PAIN UNSPECIFIED 11/26/2008  . DEPRESSION 09/04/2006  . Essential hypertension 09/04/2006  . GERD 09/04/2006  . MULTIPLE SITES, ARTHRITIS-ACUTE/CHRONIC 09/04/2006  . LOW BACK PAIN, CHRONIC 09/04/2006    Past Surgical History:  Procedure Laterality Date  . ABDOMINAL HYSTERECTOMY    . LEFT HEART CATHETERIZATION WITH CORONARY  ANGIOGRAM N/A 11/05/2013   Procedure: LEFT HEART CATHETERIZATION WITH CORONARY ANGIOGRAM;  Surgeon: Lennette Bihari, MD;  Location: [facility] CATH LAB;  Service: Cardiovascular;  Laterality: N/A;     OB History   No obstetric history on file.     No family history on file.  Social History   Tobacco Use  . Smoking status: Never Smoker  . Smokeless tobacco: Never Used  Substance Use Topics  . Alcohol use: No  . Drug use: No    Home Medications Prior to Admission medications   Medication Sig Start Date End Date Taking? Authorizing Provider  amLODipine (NORVASC) 10 MG tablet Take 10 mg by mouth daily.    [provider]  chlorthalidone (HYGROTON) 25 MG tablet Take 25 mg by mouth daily. 03/16/19   [provider]  cloNIDine (CATAPRES) 0.2 MG tablet Take 0.2 mg by mouth 2 (two) times daily.    [provider]  clopidogrel (PLAVIX) 75 MG tablet Take 75 mg by mouth daily. 03/16/19   [provider]  diclofenac Sodium (VOLTAREN) 1 % GEL Apply 4 g topically 4 (four) times daily. 09/30/19   Palumbo, April, MD  doxycycline (VIBRAMYCIN) 100 MG capsule Take 1 capsule (100 mg total) by mouth 2 (two) times daily. 04/11/19   Army Melia A, PA-C  glucose blood test strip Use as instructed 04/11/19   Army Melia A, PA-C  hydrALAZINE (APRESOLINE) 25  MG tablet Take 1 tablet (25 mg total) by mouth 3 (three) times daily. 02/10/18   Arby Barrette, MD  hydrALAZINE (APRESOLINE) 50 MG tablet Take 50 mg by mouth 2 (two) times daily. 03/22/19   [provider]  lidocaine (LIDODERM) 5 % PLEASE SEE ATTACHED FOR DETAILED DIRECTIONS 03/16/19   [provider]  lidocaine (LIDODERM) 5 % Place 1 patch onto the skin daily. Remove & Discard patch within 12 hours or as directed by MD 09/30/19   Nicanor Alcon, April, MD  losartan (COZAAR) 50 MG tablet Take 50 mg by mouth daily. 03/16/19   [provider]  metFORMIN (GLUCOPHAGE) 500 MG tablet Take 1 tablet (500 mg total) by  mouth daily with breakfast for 7 days. 04/11/19 04/18/19  [name], PA-C  methocarbamol (ROBAXIN) 500 MG tablet Take 1 tablet (500 mg total) by mouth every 8 (eight) hours as needed for muscle spasms. 10/06/19   Benjiman Core, MD  MYRBETRIQ 25 MG TB24 tablet Take 25 mg by mouth daily. 02/03/18   [provider]  potassium chloride (MICRO-K) 10 MEQ CR capsule Take 10 mEq by mouth daily. 03/16/19   [provider]  predniSONE (DELTASONE) 20 MG tablet Take 1 tablet (20 mg total) by mouth daily. 10/06/19   Benjiman Core, MD  rosuvastatin (CRESTOR) 10 MG tablet Take 10 mg by mouth at bedtime. 03/22/19   [provider]  VENTOLIN HFA 108 (90 Base) MCG/ACT inhaler Inhale 2 puffs into the lungs every 6 (six) hours as needed for shortness of breath. 01/31/18   [provider]    Allergies    Codeine, Ibuprofen, and Imdur [isosorbide nitrate]  Review of Systems   Review of Systems  Constitutional: Negative for appetite change.  Respiratory: Negative for shortness of breath.   Cardiovascular: Positive for chest pain.  Gastrointestinal: Negative for abdominal pain.  Musculoskeletal:       Left shoulder pain  Neurological: Negative for weakness.  Psychiatric/Behavioral: Negative for confusion.    Physical Exam Updated Vital Signs BP (!) 198/73   Pulse 67   Temp 98.2 F (36.8 C) (Oral)   Resp 20   SpO2 100%   Physical Exam Vitals and nursing note reviewed.  HENT:     Head: Normocephalic and atraumatic.  Eyes:     Pupils: Pupils are equal, round, and reactive to light.  Cardiovascular:     Rate and Rhythm: Normal rate and regular rhythm.  Chest:     Chest wall: Tenderness present.  Abdominal:     Tenderness: There is no abdominal tenderness.  Musculoskeletal:        General: Tenderness present.     Comments: Tenderness of left shoulder.  Decreased range of motion.  No erythema.  No tenderness over elbow or hand.  Pulse intact in wrist.   Sensation grossly intact in hand.  Neurological:     Mental Status: She is alert.   No midline neck tenderness.  ED Results / Procedures / Treatments   Labs (all labs ordered are listed, but only abnormal results are displayed) Labs Reviewed - No data to display  EKG None  Radiology DG Ribs Unilateral W/Chest Left  Result Date: 10/06/2019 CLINICAL DATA:  Pain following recent fall EXAM: LEFT RIBS AND CHEST - 3+ VIEW COMPARISON:  September 29, 2019. FINDINGS: Frontal chest as well as oblique and cone-down rib images were obtained. Lungs are clear. Heart size and pulmonary vascularity are normal. No adenopathy. There is aortic atherosclerosis. Small cervical rib  noted on the left. There is no evident pneumothorax or pleural effusion. No evident rib fracture. IMPRESSION: No demonstrable rib fracture. Lungs clear. Small cervical rib on the left. Aortic Atherosclerosis (ICD10-I70.0). Electronically Signed   By: Bretta Bang III M.D.   On: 10/06/2019 14:21   DG Shoulder Left  Result Date: 10/06/2019 CLINICAL DATA:  Pain following recent fall EXAM: LEFT SHOULDER - 2+ VIEW COMPARISON:  March 03, 2018 FINDINGS: Oblique, Y scapular, and axillary images were obtained. Bones appear osteoporotic. No fracture or dislocation. There is mild narrowing of the acromioclavicular joint. The glenohumeral joint appears unremarkable. No erosive change. Visualized left lung clear. IMPRESSION: Mild narrowing acromioclavicular joint. No fracture or dislocation. Bones appear osteoporotic. Electronically Signed   By: Bretta Bang III M.D.   On: 10/06/2019 14:19    Procedures Procedures (including critical care time)  Medications Ordered in ED Medications  acetaminophen (TYLENOL) tablet 650 mg (has no administration in time range)    ED Course  I have reviewed the triage vital signs and the nursing notes.  Pertinent labs & imaging results that were available during my care of the patient were  reviewed by me and considered in my medical decision making (see chart for details).    MDM Rules/Calculators/A&P                          Patient with left shoulder pain after fall.  X-ray shows some nonspecific changes.  Will add short course of steroids.  Discussed with patient about risks of sugar being her diabetic.  Will monitor her sugar more closely at home.  Also left chest pain is reproducible.  X-ray reassuring.  Discharge home.  We will also give Robaxin.  Aware of risks including confusion or unsteadiness.  Follow-up with orthopedic surgery as needed. Final Clinical Impression(s) / ED Diagnoses Final diagnoses:  Acute pain of left shoulder    Rx / DC Orders ED Discharge Orders         Ordered    methocarbamol (ROBAXIN) 500 MG tablet  Every 8 hours PRN        10/06/19 1456    predniSONE (DELTASONE) 20 MG tablet  Daily        10/06/19 1456           Benjiman Core, MD 10/06/19 1511    Benjiman Core, MD 10/06/19 1511

## 2019-10-06 NOTE — ED Triage Notes (Signed)
Pt c/o L shoulder pain, neck pain and L leg pain since falling last week at [facility], pt states her pain medication is not strong enough

## 2019-10-12 ENCOUNTER — Other Ambulatory Visit: Payer: Self-pay

## 2019-10-12 ENCOUNTER — Emergency Department (HOSPITAL_COMMUNITY): Payer: Medicare HMO

## 2019-10-12 ENCOUNTER — Emergency Department (HOSPITAL_COMMUNITY)
Admission: EM | Admit: 2019-10-12 | Discharge: 2019-10-13 | Disposition: A | Discharge status: Home / Self Care | Payer: Medicare HMO | Attending: Emergency Medicine | Admitting: Emergency Medicine

## 2019-10-12 ENCOUNTER — Encounter (HOSPITAL_COMMUNITY): Payer: Self-pay | Admitting: Emergency Medicine

## 2019-10-12 DIAGNOSIS — E876 Hypokalemia: Secondary | ICD-10-CM

## 2019-10-12 DIAGNOSIS — I10 Essential (primary) hypertension: Secondary | ICD-10-CM | POA: Diagnosis not present

## 2019-10-12 DIAGNOSIS — R531 Weakness: Secondary | ICD-10-CM | POA: Insufficient documentation

## 2019-10-12 DIAGNOSIS — E119 Type 2 diabetes mellitus without complications: Secondary | ICD-10-CM | POA: Diagnosis not present

## 2019-10-12 DIAGNOSIS — M5030 Other cervical disc degeneration, unspecified cervical region: Secondary | ICD-10-CM

## 2019-10-12 DIAGNOSIS — Z7984 Long term (current) use of oral hypoglycemic drugs: Secondary | ICD-10-CM | POA: Insufficient documentation

## 2019-10-12 DIAGNOSIS — M542 Cervicalgia: Secondary | ICD-10-CM | POA: Insufficient documentation

## 2019-10-12 DIAGNOSIS — R0789 Other chest pain: Secondary | ICD-10-CM | POA: Insufficient documentation

## 2019-10-12 DIAGNOSIS — M25512 Pain in left shoulder: Secondary | ICD-10-CM | POA: Diagnosis not present

## 2019-10-12 DIAGNOSIS — M899 Disorder of bone, unspecified: Secondary | ICD-10-CM

## 2019-10-12 DIAGNOSIS — R42 Dizziness and giddiness: Secondary | ICD-10-CM | POA: Insufficient documentation

## 2019-10-12 DIAGNOSIS — Z79899 Other long term (current) drug therapy: Secondary | ICD-10-CM | POA: Diagnosis not present

## 2019-10-12 DIAGNOSIS — R739 Hyperglycemia, unspecified: Secondary | ICD-10-CM

## 2019-10-12 LAB — BASIC METABOLIC PANEL
Anion gap: 11 (ref 5–15)
BUN: 16 mg/dL (ref 8–23)
CO2: 28 mmol/L (ref 22–32)
Calcium: 8.9 mg/dL (ref 8.9–10.3)
Chloride: 99 mmol/L (ref 98–111)
Creatinine, Ser: 1.05 mg/dL — ABNORMAL HIGH (ref 0.44–1.00)
GFR calc Af Amer: >60 mL/min (ref >60)
GFR calc non Af Amer: 53 mL/min — ABNORMAL LOW (ref >60)
Glucose, Bld: 201 mg/dL — ABNORMAL HIGH (ref 70–99)
Potassium: 3.4 mmol/L — ABNORMAL LOW (ref 3.5–5.1)
Sodium: 138 mmol/L (ref 135–145)

## 2019-10-12 LAB — CBC
HCT: 35.8 % — ABNORMAL LOW (ref 36.0–46.0)
Hemoglobin: 11.1 g/dL — ABNORMAL LOW (ref 12.0–15.0)
MCH: 30 pg (ref 26.0–34.0)
MCHC: 31 g/dL (ref 30.0–36.0)
MCV: 96.8 fL (ref 80.0–100.0)
Platelets: 267 10*3/uL (ref 150–400)
RBC: 3.7 MIL/uL — ABNORMAL LOW (ref 3.87–5.11)
RDW: 13.2 % (ref 11.5–15.5)
WBC: 7.7 10*3/uL (ref 4.0–10.5)
nRBC: 0.7 % — ABNORMAL HIGH (ref 0.0–0.2)

## 2019-10-12 LAB — TROPONIN I (HIGH SENSITIVITY): Troponin I (High Sensitivity): 9 ng/L (ref <18)

## 2019-10-12 MED ORDER — POTASSIUM CHLORIDE CRYS ER 20 MEQ PO TBCR
40.0000 meq | EXTENDED_RELEASE_TABLET | Freq: Once | ORAL | Status: AC
  Administered 2019-10-12: 40 meq via ORAL
  Filled 2019-10-12: qty 2

## 2019-10-12 MED ORDER — METHOCARBAMOL 500 MG PO TABS
750.0000 mg | ORAL_TABLET | Freq: Once | ORAL | Status: AC
  Administered 2019-10-12: 750 mg via ORAL
  Filled 2019-10-12: qty 2

## 2019-10-12 MED ORDER — ACETAMINOPHEN 500 MG PO TABS
1000.0000 mg | ORAL_TABLET | Freq: Once | ORAL | Status: AC
  Administered 2019-10-12: 1000 mg via ORAL
  Filled 2019-10-12: qty 2

## 2019-10-12 NOTE — ED Triage Notes (Signed)
Pt has c/o of chest pain and tingling of her fingers.  Pt denies chest pain radiating. Pt was recently seen last week this ED for a fall. Pt is unaware if she is on blood thinners. Denies SOB.

## 2019-10-12 NOTE — ED Notes (Signed)
Patient transported to CT 

## 2019-10-12 NOTE — Discharge Instructions (Addendum)
It was our pleasure to provide your ER care today - we hope that you feel better.  Try heat therapy and/or gentle massage to sore area.   Take acetaminophen or ibuprofen as need for pain.  You may also take robaxin as need for muscle pain/spasm - no driving for the next 6 hours, or when taking robaxin.   Your neck ct scan was read as showing significant degenerative changes, as well as ' innumerable sub-centimeter lytic lesions in the cervical spine' - the radiologist recommends a follow up nuclear medicine bone scan - follow up with your doctor in the next 2-3 days, have them review your CT scan results and arrange further imaging and work up.  For neck pain and abnormal CT scan, also follow up with neck/spine specialist in the next 1-2 weeks - call office to arrange appointment.  From today's lab tests, your potassium level is mildly low (3.4) and blood sugar elevated - eat plenty of fruits and vegetables, and follow up with primary care doctor in the next 1-2 weeks.   Also, your blood pressure is high - continue your blood pressure meds, limit salt intake, and follow up with your doctor in 1 week.   Return to ER if worse, new symptoms, fevers, chest pain, trouble breathing, severe or intractable pain, numbness/weakness of arms/legs, change in speech or vision, severe headache, or other concern.

## 2019-10-12 NOTE — ED Provider Notes (Addendum)
[facility] EMERGENCY DEPARTMENT Provider Note   CSN: [id number] Arrival date & time: 10/12/19  1712     History Chief Complaint  Patient presents with  . Chest Pain    [name] is a 74 y.o. female.  Patient c/o pain to neck and left shoulder from recent fall which she indicates was approximately 1 month ago. States mechanical fall, no faintness or dizziness.  Since then constant, dull, pain to neck, especially left side and left shoulder. No radicular pain down arm. States intermittently bilateral hands feel numb/tingly - no acute or abrupt change in these symptoms. Denies weakness or loss of normal function of arms/legs. Denies wrist pain. Denies headache. No mid to lower back pain. No anterior chest pain or discomfort.  Denies personal or fam hx cad. No sob or unusual doe or fatigue. No abd pain or nv.  Patient notes symptoms present since fall, denies acute/abrupt worsening, but says when she was seen in ED previously for same, was not given enough pain medication, and has run out.   The history is provided by the patient.  Chest Pain Associated symptoms: no abdominal pain, no back pain, no cough, no dysphagia, no fever, no headache, no nausea, no palpitations, no shortness of breath, no vomiting and no weakness        Past Medical History:  Diagnosis Date  . Diabetes mellitus without complication (HCC)   . GERD 09/04/2006   Qualifier: Diagnosis of  By: Duke Salvia    . History of echocardiogram    a. 2D ECHO: 11/06/2013 EF 65%; no WMA. Mild TR. PA pk pressure 43 mm HG  . Hypertension   . Stroke [facility])     Patient Active Problem List   Diagnosis Date Noted  . Acute respiratory disease due to COVID-19 virus 05/23/2018  . NSTEMI (non-ST elevated myocardial infarction) (HCC) 11/05/2013  . Spinal stenosis of cervical region 10/20/2012  . Prolonged QT interval 10/18/2012  . Left sided numbness 10/18/2012  . Palpitations 10/18/2012  . 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 Numbness 06/20/2012  . Diabetes mellitus (HCC) 06/20/2012  . CVA (cerebral infarction) 06/20/2012  . Hypokalemia 06/20/2012  . ARTHRALGIA 01/28/2009  . CHEST PAIN UNSPECIFIED 11/26/2008  . DEPRESSION 09/04/2006  . Essential hypertension 09/04/2006  . GERD 09/04/2006  . MULTIPLE SITES, ARTHRITIS-ACUTE/CHRONIC 09/04/2006  . LOW BACK PAIN, CHRONIC 09/04/2006    Past Surgical History:  Procedure Laterality Date  . ABDOMINAL HYSTERECTOMY    . LEFT HEART CATHETERIZATION WITH CORONARY ANGIOGRAM N/A 11/05/2013   Procedure: LEFT HEART CATHETERIZATION WITH CORONARY ANGIOGRAM;  Surgeon: Lennette Bihari, MD;  Location: [facility] CATH LAB;  Service: Cardiovascular;  Laterality: N/A;     OB History   No obstetric history on file.     History reviewed. No pertinent family history.  Social History   Tobacco Use  . Smoking status: Never Smoker  . Smokeless tobacco: Never Used  Substance Use Topics  . Alcohol use: No  . Drug use: No    Home Medications Prior to Admission medications   Medication Sig Start Date End Date Taking? Authorizing Provider  amLODipine (NORVASC) 10 MG tablet Take 10 mg by mouth daily.    [provider]  chlorthalidone (HYGROTON) 25 MG tablet Take 25 mg by mouth daily. 03/16/19   [provider]  cloNIDine (CATAPRES) 0.2 MG tablet Take 0.2 mg by mouth 2 (two) times daily.    [provider]  clopidogrel (PLAVIX) 75 MG  tablet Take 75 mg by mouth daily. 03/16/19   [provider]  diclofenac Sodium (VOLTAREN) 1 % GEL Apply 4 g topically 4 (four) times daily. 09/30/19   Palumbo, April, MD  doxycycline (VIBRAMYCIN) 100 MG capsule Take 1 capsule (100 mg total) by mouth 2 (two) times daily. 04/11/19   Army Melia A, PA-C  glucose blood test strip Use as instructed 04/11/19   Army Melia A, PA-C  hydrALAZINE (APRESOLINE) 25 MG tablet Take 1 tablet (25 mg total) by mouth 3 (three) times daily. 02/10/18   Arby Barrette, MD  hydrALAZINE (APRESOLINE) 50 MG tablet Take 50 mg by mouth 2 (two) times daily. 03/22/19   [provider]  lidocaine (LIDODERM) 5 % PLEASE SEE ATTACHED FOR DETAILED DIRECTIONS 03/16/19   [provider]  lidocaine (LIDODERM) 5 % Place 1 patch onto the skin daily. Remove & Discard patch within 12 hours or as directed by MD 09/30/19   Nicanor Alcon, April, MD  losartan (COZAAR) 50 MG tablet Take 50 mg by mouth daily. 03/16/19   [provider]  metFORMIN (GLUCOPHAGE) 500 MG tablet Take 1 tablet (500 mg total) by mouth daily with breakfast for 7 days. 04/11/19 04/18/19  [name], PA-C  methocarbamol (ROBAXIN) 500 MG tablet Take 1 tablet (500 mg total) by mouth every 8 (eight) hours as needed for muscle spasms. 10/06/19   Benjiman Core, MD  MYRBETRIQ 25 MG TB24 tablet Take 25 mg by mouth daily. 02/03/18   [provider]  potassium chloride (MICRO-K) 10 MEQ CR capsule Take 10 mEq by mouth daily. 03/16/19   [provider]  predniSONE (DELTASONE) 20 MG tablet Take 1 tablet (20 mg total) by mouth daily. 10/06/19   Benjiman Core, MD  rosuvastatin (CRESTOR) 10 MG tablet Take 10 mg by mouth at bedtime. 03/22/19   [provider]  VENTOLIN HFA 108 (90 Base) MCG/ACT inhaler Inhale 2 puffs into the lungs every 6 (six) hours as needed for shortness of breath. 01/31/18   [provider]    Allergies    Codeine, Ibuprofen, and Imdur [isosorbide nitrate]  Review of Systems   Review of Systems  Constitutional: Negative for chills and fever.  HENT: Negative for sore throat and trouble swallowing.   Eyes: Negative for pain, redness and visual disturbance.  Respiratory: Negative for cough and shortness of breath.   Cardiovascular: Positive for chest pain. Negative for palpitations and leg swelling.  Gastrointestinal: Negative for abdominal pain, nausea and vomiting.  Genitourinary: Negative for flank pain.  Musculoskeletal: Positive for neck  pain. Negative for back pain.  Skin: Negative for rash.  Neurological: Negative for speech difficulty, weakness and headaches.  Hematological: Does not bruise/bleed easily.  Psychiatric/Behavioral: Negative for confusion.    Physical Exam Updated Vital Signs BP 112/68 (BP Location: Left Arm)   Pulse 70   Temp 98.2 F (36.8 C) (Oral)   Resp 16   Ht 1.626 m (5\' 4" )   Wt 68 kg   SpO2 99%   BMI 25.73 kg/m   Physical Exam Vitals and nursing note reviewed.  Constitutional:      Appearance: Normal appearance. She is well-developed.  HENT:     Head: Atraumatic.     Nose: Nose normal.     Mouth/Throat:     Mouth: Mucous membranes are moist.  Eyes:     General: No scleral icterus.    Conjunctiva/sclera: Conjunctivae normal.     Pupils: Pupils are equal, round, and reactive to  light.  Neck:     Vascular: No carotid bruit.     Trachea: No tracheal deviation.  Cardiovascular:     Rate and Rhythm: Normal rate and regular rhythm.     Pulses: Normal pulses.     Heart sounds: Normal heart sounds. No murmur heard.  No friction rub. No gallop.   Pulmonary:     Effort: Pulmonary effort is normal. No respiratory distress.     Breath sounds: Normal breath sounds.  Chest:     Chest wall: No tenderness.  Abdominal:     General: Bowel sounds are normal. There is no distension.     Palpations: Abdomen is soft.     Tenderness: There is no abdominal tenderness. There is no guarding.  Genitourinary:    Comments: No cva tenderness.  Musculoskeletal:        General: No swelling or tenderness.     Cervical back: Normal range of motion and neck supple. No rigidity. No muscular tenderness.     Right lower leg: No edema.     Left lower leg: No edema.     Comments: Mid cervical tenderness, and left trapezius muscular tenderness, otherwise, CTLS spine, non tender, aligned, no step off. Good passive rom left shoulder without pain. No deformity noted, no focal bony tenderness on extremity exam. No  arm swelling. Distal pulses palp bil upper and lower extremities, 2+.   Skin:    General: Skin is warm and dry.     Findings: No rash.  Neurological:     Mental Status: She is alert.     Comments: Alert, speech normal. Motor intact bil arms/legs, stre 5/5. Sens grossly intact bil. Steady gait.   Psychiatric:        Mood and Affect: Mood normal.     ED Results / Procedures / Treatments   Labs (all labs ordered are listed, but only abnormal results are displayed) Results for orders placed or performed during the hospital encounter of 10/12/19  Basic metabolic panel  Result Value Ref Range   Sodium 138 135 - 145 mmol/L   Potassium 3.4 (L) 3.5 - 5.1 mmol/L   Chloride 99 98 - 111 mmol/L   CO2 28 22 - 32 mmol/L   Glucose, Bld 201 (H) 70 - 99 mg/dL   BUN 16 8 - 23 mg/dL   Creatinine, Ser 2.11 (H) 0.44 - 1.00 mg/dL   Calcium 8.9 8.9 - 94.1 mg/dL   GFR calc non Af Amer 53 (L) >60 mL/min   GFR calc Af Amer >60 >60 mL/min   Anion gap 11 5 - 15  CBC  Result Value Ref Range   WBC 7.7 4.0 - 10.5 K/uL   RBC 3.70 (L) 3.87 - 5.11 MIL/uL   Hemoglobin 11.1 (L) 12.0 - 15.0 g/dL   HCT 74.0 (L) 36 - 46 %   MCV 96.8 80.0 - 100.0 fL   MCH 30.0 26.0 - 34.0 pg   MCHC 31.0 30.0 - 36.0 g/dL   RDW 81.4 48.1 - 85.6 %   Platelets 267 150 - 400 K/uL   nRBC 0.7 (H) 0.0 - 0.2 %  Troponin I (High Sensitivity)  Result Value Ref Range   Troponin I (High Sensitivity) 9 <18 ng/L   DG Chest 2 View  Result Date: 10/12/2019 CLINICAL DATA:  Mid chest pain EXAM: CHEST - 2 VIEW COMPARISON:  10/06/2019 FINDINGS: Frontal and lateral views of the chest demonstrate a stable cardiac silhouette. No airspace disease, effusion, or pneumothorax.  No acute bony abnormality. IMPRESSION: 1. No acute intrathoracic process. Electronically Signed   By: Sharlet Salina M.D.   On: 10/12/2019 18:49   DG Chest 2 View  Result Date: 09/29/2019 CLINICAL DATA:  Left chest and shoulder pain, fell EXAM: CHEST - 2 VIEW COMPARISON:   05/23/2018 FINDINGS: Frontal and lateral views of the chest demonstrate an unremarkable cardiac silhouette. No airspace disease, effusion, or pneumothorax. No acute displaced fracture. IMPRESSION: 1. No acute intrathoracic process. Electronically Signed   By: Sharlet Salina M.D.   On: 09/29/2019 17:58   DG Ribs Unilateral W/Chest Left  Result Date: 10/06/2019 CLINICAL DATA:  Pain following recent fall EXAM: LEFT RIBS AND CHEST - 3+ VIEW COMPARISON:  September 29, 2019. FINDINGS: Frontal chest as well as oblique and cone-down rib images were obtained. Lungs are clear. Heart size and pulmonary vascularity are normal. No adenopathy. There is aortic atherosclerosis. Small cervical rib noted on the left. There is no evident pneumothorax or pleural effusion. No evident rib fracture. IMPRESSION: No demonstrable rib fracture. Lungs clear. Small cervical rib on the left. Aortic Atherosclerosis (ICD10-I70.0). Electronically Signed   By: Bretta Bang III M.D.   On: 10/06/2019 14:21   CT HEAD WO CONTRAST  Result Date: 10/13/2019 CLINICAL DATA:  Dizziness and weakness EXAM: CT HEAD WITHOUT CONTRAST TECHNIQUE: Contiguous axial images were obtained from the base of the skull through the vertex without intravenous contrast. COMPARISON:  09/23/2019 FINDINGS: Brain: Normal anatomic configuration. No abnormal intra or extra-axial mass lesion or fluid collection. No abnormal mass effect or midline shift. No evidence of acute intracranial hemorrhage or infarct. Ventricular size is normal. Cerebellum unremarkable. Vascular: Unremarkable Skull: Intact Sinuses/Orbits: There is complete opacification with wall thickening involving the visualized left maxillary sinus in keeping with changes of chronic sinusitis, incompletely evaluated on this exam. Remaining paranasal sinuses are clear. Orbits are unremarkable. Other: Mastoid air cells and middle ear cavities are clear. IMPRESSION: No acute intracranial abnormality. Chronic left  maxillary sinusitis, incompletely evaluated on this exam. Electronically Signed   By: Helyn Numbers MD   On: 10/13/2019 00:27   CT Head Wo Contrast  Result Date: 09/23/2019 CLINICAL DATA:  Headache and generalized body aches. Cerebral hemorrhage suspected EXAM: CT HEAD WITHOUT CONTRAST TECHNIQUE: Contiguous axial images were obtained from the base of the skull through the vertex without intravenous contrast. COMPARISON:  04/11/2019 FINDINGS: Brain: No evidence of acute infarction, hemorrhage, hydrocephalus, extra-axial collection or mass lesion/mass effect. Vascular: No hyperdense vessel or unexpected calcification. Skull: Normal. Negative for fracture or focal lesion. Sinuses/Orbits: Completely opacified left maxillary sinus with sclerotic wall thickening, chronic. There is also left ethmoid and inferior frontal opacification with narrowing of the left middle meatus. IMPRESSION: 1. Normal appearance of the brain. 2. 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 Electronically Signed   By: Marnee Spring M.D.   On: 09/23/2019 05:57   CT Cervical Spine Wo Contrast  Result Date: 10/13/2019 CLINICAL DATA:  Chest pain and tingling in her fingers. EXAM: CT CERVICAL SPINE WITHOUT CONTRAST TECHNIQUE: Multidetector CT imaging of the cervical spine was performed without intravenous contrast. Multiplanar CT image reconstructions were also generated. COMPARISON:  Cervical spine MRI, dated October 19, 2012. FINDINGS: Alignment: Normal. Skull base and vertebrae: No acute fracture. Innumerable subcentimeter lytic areas are seen scattered throughout the cervical spine. Soft tissues and spinal canal: No prevertebral fluid or swelling. No visible canal hematoma. Disc levels: Moderate to marked severity endplate sclerosis is seen at the levels of  C4-C5, C5-C6 and C6-C7. Moderate to marked severity intervertebral disc space narrowing is seen at the levels of C4-C5, C5-C6 and C6-C7. Mild bilateral  multilevel facet joint hypertrophy is noted. Upper chest: Negative. Other: There is marked severity left maxillary sinus mucosal thickening. IMPRESSION: 1. Innumerable subcentimeter lytic areas are seen scattered throughout the cervical spine. Correlation with a nuclear medicine bone scan is recommended, as sequelae associated with osseous metastasis cannot be excluded. 2. 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 3. Marked severity left maxillary sinus disease. Electronically Signed   By: Aram Candela M.D.   On: 10/13/2019 00:11   DG Shoulder Left  Result Date: 10/06/2019 CLINICAL DATA:  Pain following recent fall EXAM: LEFT SHOULDER - 2+ VIEW COMPARISON:  March 03, 2018 FINDINGS: Oblique, Y scapular, and axillary images were obtained. Bones appear osteoporotic. No fracture or dislocation. There is mild narrowing of the acromioclavicular joint. The glenohumeral joint appears unremarkable. No erosive change. Visualized left lung clear. IMPRESSION: Mild narrowing acromioclavicular joint. No fracture or dislocation. Bones appear osteoporotic. Electronically Signed   By: Bretta Bang III M.D.   On: 10/06/2019 14:19    ED ECG REPORT   Date: 10/13/2019  Rate: 77  Rhythm: normal sinus rhythm  QRS Axis: normal  Intervals: normal  ST/T Wave abnormalities: normal  Conduction Disutrbances:none  Narrative Interpretation:   Old EKG Reviewed: unchanged  I have personally reviewed the EKG tracing   Radiology DG Chest 2 View  Result Date: 10/12/2019 CLINICAL DATA:  Mid chest pain EXAM: CHEST - 2 VIEW COMPARISON:  10/06/2019 FINDINGS: Frontal and lateral views of the chest demonstrate a stable cardiac silhouette. No airspace disease, effusion, or pneumothorax. No acute bony abnormality. IMPRESSION: 1. No acute intrathoracic process. Electronically Signed   By: Sharlet Salina M.D.   On: 10/12/2019 18:49   CT HEAD WO CONTRAST  Result Date:  10/13/2019 CLINICAL DATA:  Dizziness and weakness EXAM: CT HEAD WITHOUT CONTRAST TECHNIQUE: Contiguous axial images were obtained from the base of the skull through the vertex without intravenous contrast. COMPARISON:  09/23/2019 FINDINGS: Brain: Normal anatomic configuration. No abnormal intra or extra-axial mass lesion or fluid collection. No abnormal mass effect or midline shift. No evidence of acute intracranial hemorrhage or infarct. Ventricular size is normal. Cerebellum unremarkable. Vascular: Unremarkable Skull: Intact Sinuses/Orbits: There is complete opacification with wall thickening involving the visualized left maxillary sinus in keeping with changes of chronic sinusitis, incompletely evaluated on this exam. Remaining paranasal sinuses are clear. Orbits are unremarkable. Other: Mastoid air cells and middle ear cavities are clear. IMPRESSION: No acute intracranial abnormality. Chronic left maxillary sinusitis, incompletely evaluated on this exam. Electronically Signed   By: Helyn Numbers MD   On: 10/13/2019 00:27   CT Cervical Spine Wo Contrast  Result Date: 10/13/2019 CLINICAL DATA:  Chest pain and tingling in her fingers. EXAM: CT CERVICAL SPINE WITHOUT CONTRAST TECHNIQUE: Multidetector CT imaging of the cervical spine was performed without intravenous contrast. Multiplanar CT image reconstructions were also generated. COMPARISON:  Cervical spine MRI, dated October 19, 2012. FINDINGS: Alignment: Normal. Skull base and vertebrae: No acute fracture. Innumerable subcentimeter lytic areas are seen scattered throughout the cervical spine. Soft tissues and spinal canal: No prevertebral fluid or swelling. No visible canal hematoma. Disc levels: Moderate to marked severity endplate sclerosis is seen at the levels of C4-C5, C5-C6 and C6-C7. Moderate to marked severity intervertebral disc space narrowing is seen at the levels of C4-C5, C5-C6  and C6-C7. Mild bilateral multilevel facet joint hypertrophy is  noted. Upper chest: Negative. Other: There is marked severity left maxillary sinus mucosal thickening. IMPRESSION: 1. Innumerable subcentimeter lytic areas are seen scattered throughout the cervical spine. Correlation with a nuclear medicine bone scan is recommended, as sequelae associated with osseous metastasis cannot be excluded. 2. 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 3. Marked severity left maxillary sinus disease. Electronically Signed   By: Aram Candelahaddeus  Houston M.D.   On: 10/13/2019 00:11    Procedures Procedures (including critical care time)  Medications Ordered in ED Medications  acetaminophen (TYLENOL) tablet 1,000 mg (has no administration in time range)  methocarbamol (ROBAXIN) tablet 750 mg (has no administration in time range)  potassium chloride SA (KLOR-CON) CR tablet 40 mEq (has no administration in time range)    ED Course  I have reviewed the triage vital signs and the nursing notes.  Pertinent labs & imaging results that were available during my care of the patient were reviewed by me and considered in my medical decision making (see chart for details).    MDM Rules/Calculators/A&P                         Labs and imaging ordered. Continuous pulse ox and cardiac monitoring. Ecg.  Reviewed nursing notes and prior charts for additional history.  Prior ED visit reviewed for same - CT head neg acute, left shoulder xrays w no fx.   Labs reviewed/interpreted by me - trop normal. Pt has no anterior/chest pain, no sob. After prolonged symptoms for days, trop is normal. Patient with prior heart cath with normal coronaries then. Felt not c/w ACS.  CXR reviewed/interpreted by me - no pna.   CT reviewed/interpreted by me - no fx. Innumerable lytic, subcm lesions noted, as well as significant degen changes - discussed all findings with pt, and stressed need for pcp/spine f/u. Pt voiced her understanding, and indicates will  f/u closely. Pt requests new rx for robaxin indicating it helped her symptoms.   Also discussed w pcp, close f/u re htn, glucose, and other symptoms.   Pt requests something for pain. Acetaminophen po, robaxin po.   Recheck, pt comfortable, no distress. No weakness on exam. Pain controlled. Pt currently appears stable for d/c.   Return precautions provided.        Final Clinical Impression(s) / ED Diagnoses Final diagnoses:  None    Rx / DC Orders ED Discharge Orders    None         Cathren LaineSteinl, Aviya Jarvie, MD 10/13/19 [phone]

## 2019-10-13 ENCOUNTER — Emergency Department (HOSPITAL_COMMUNITY): Payer: Medicare HMO

## 2019-10-13 DIAGNOSIS — R0789 Other chest pain: Secondary | ICD-10-CM | POA: Diagnosis not present

## 2019-10-13 MED ORDER — METHOCARBAMOL 750 MG PO TABS: 750.0000 mg | ORAL_TABLET | Freq: Three times a day (TID) | ORAL | 0 refills | Status: DC | PRN

## 2019-10-20 ENCOUNTER — Encounter (HOSPITAL_COMMUNITY): Payer: Self-pay | Admitting: *Deleted

## 2019-10-20 ENCOUNTER — Ambulatory Visit (HOSPITAL_COMMUNITY)
Admission: EM | Admit: 2019-10-20 | Discharge: 2019-10-20 | Disposition: A | Discharge status: Home / Self Care | Payer: Medicare HMO | Attending: Urgent Care | Admitting: Urgent Care

## 2019-10-20 ENCOUNTER — Other Ambulatory Visit: Payer: Self-pay

## 2019-10-20 DIAGNOSIS — M542 Cervicalgia: Secondary | ICD-10-CM

## 2019-10-20 DIAGNOSIS — M25512 Pain in left shoulder: Secondary | ICD-10-CM | POA: Diagnosis not present

## 2019-10-20 DIAGNOSIS — E1165 Type 2 diabetes mellitus with hyperglycemia: Secondary | ICD-10-CM

## 2019-10-20 DIAGNOSIS — M19019 Primary osteoarthritis, unspecified shoulder: Secondary | ICD-10-CM

## 2019-10-20 DIAGNOSIS — M5030 Other cervical disc degeneration, unspecified cervical region: Secondary | ICD-10-CM

## 2019-10-20 DIAGNOSIS — I10 Essential (primary) hypertension: Secondary | ICD-10-CM

## 2019-10-20 LAB — CBG MONITORING, ED: Glucose-Capillary: 171 mg/dL — ABNORMAL HIGH (ref 70–99)

## 2019-10-20 MED ORDER — ALBUTEROL SULFATE HFA 108 (90 BASE) MCG/ACT IN AERS: 1.0000 | INHALATION_SPRAY | Freq: Four times a day (QID) | RESPIRATORY_TRACT | 0 refills | Status: DC | PRN

## 2019-10-20 MED ORDER — CHLORTHALIDONE 25 MG PO TABS: 25.0000 mg | ORAL_TABLET | Freq: Every day | ORAL | 0 refills | Status: DC

## 2019-10-20 MED ORDER — CLONIDINE HCL 0.2 MG PO TABS: 0.2000 mg | ORAL_TABLET | Freq: Two times a day (BID) | ORAL | 0 refills | Status: DC

## 2019-10-20 MED ORDER — LOSARTAN POTASSIUM 50 MG PO TABS: 50.0000 mg | ORAL_TABLET | Freq: Every day | ORAL | 0 refills | Status: DC

## 2019-10-20 MED ORDER — METFORMIN HCL 500 MG PO TABS: 500.0000 mg | ORAL_TABLET | Freq: Two times a day (BID) | ORAL | 0 refills | Status: DC

## 2019-10-20 MED ORDER — PREDNISONE 10 MG PO TABS: 30.0000 mg | ORAL_TABLET | Freq: Every day | ORAL | 0 refills | Status: DC

## 2019-10-20 NOTE — Discharge Instructions (Addendum)
For diabetes or elevated blood sugar, please make sure you are avoiding starchy, carbohydrate foods like pasta, breads, pastry, rice, potatoes, desserts, cereals. These foods can elevated your blood sugar. Also, avoid sodas, sweet teas, sugary beverages, fruit juices.  Drinking plain water will be much more helpful, try 64 ounces of water daily.  It is okay to flavor your water naturally by cutting cucumber, lemon, mint or lime, placing it in a picture with water and drinking it over a period of 2 to 3 days as long as it remains refrigerated.    For elevated blood pressure, make sure you are monitoring salt in your diet.  Do not eat restaurant foods and limit processed foods at home, prepare/cook your own foods at home.  Processed foods include things like frozen meals preseasoned meats and dinners, deli meats, canned foods as they are high in sodium/salt.  Make sure your pain attention to sodium labels on foods you by at the grocery store.  For seasoning you can use a brand called Mrs. Dash which includes a lot of salt free seasonings.  Salads - kale, spinach, cabbage, spring mix; use seeds like pumpkin seeds or sunflower seeds, almonds, walnuts or pecans; you can also use 1-2 hard boiled eggs in your salads Fruits - avocadoes, berries (blueberries, raspberries, blackberries), apples, oranges, pomegranate, pear; avoid eating bananas, grapes regularly Vegetables - aspargus, cauliflower, broccoli, green beans, brussel spouts, bell peppers; stay away from starchy vegetables like potatoes, carrots, peas  Regarding meat it is better to eat lean meats and limit your red meat including pork to once a week.  Wild caught fish, chicken breast are good options as they tend to be leaner sources of good protein.   DO NOT EAT ANY FOODS ON THIS LIST THAT YOU ARE ALLERGIC TO.

## 2019-10-20 NOTE — ED Notes (Signed)
Notified mani, pa of 171 cbg reading

## 2019-10-20 NOTE — ED Triage Notes (Signed)
Patient in with complaints of pain to the left side of neck and shoulder. Patient states that she fell last week and was told that nothing was broken. Patient also requesting a refill of all her medications to be sent to [facility] on [address]

## 2019-10-20 NOTE — ED Provider Notes (Signed)
[name]   MRN: [id number] DOB: [birth year]  Subjective:   [name] is a 74 y.o. female presenting for 1 month history of persistent left shoulder pain, neck pain.  Patient has had 3 visits in the emergency room and has had extensive work-up including cardiac work-up, x-rays and CT scans.  Most of the work-up has not shown any acute processes.  She has uncontrolled diabetes.  Does not have a regular provider.  She was prescribed 20 mg of prednisone for 7 days.  I suspect this was due to her uncontrolled diabetes.    No current facility-administered medications for this encounter.  Current Outpatient Medications:  .  chlorthalidone (HYGROTON) 25 MG tablet, Take 25 mg by mouth daily., Disp: , Rfl:  .  cloNIDine (CATAPRES) 0.2 MG tablet, Take 0.2 mg by mouth 2 (two) times daily., Disp: , Rfl:  .  hydrALAZINE (APRESOLINE) 25 MG tablet, Take 1 tablet (25 mg total) by mouth 3 (three) times daily., Disp: 60 tablet, Rfl: 1 .  hydrALAZINE (APRESOLINE) 50 MG tablet, Take 50 mg by mouth 2 (two) times daily., Disp: , Rfl:  .  losartan (COZAAR) 50 MG tablet, Take 50 mg by mouth daily., Disp: , Rfl:  .  MYRBETRIQ 25 MG TB24 tablet, Take 25 mg by mouth daily., Disp: , Rfl:  .  potassium chloride (MICRO-K) 10 MEQ CR capsule, Take 10 mEq by mouth daily., Disp: , Rfl:  .  predniSONE (DELTASONE) 20 MG tablet, Take 1 tablet (20 mg total) by mouth daily., Disp: 8 tablet, Rfl: 0 .  VENTOLIN HFA 108 (90 Base) MCG/ACT inhaler, Inhale 2 puffs into the lungs every 6 (six) hours as needed for shortness of breath., Disp: , Rfl:  .  amLODipine (NORVASC) 10 MG tablet, Take 10 mg by mouth daily., Disp: , Rfl:  .  clopidogrel (PLAVIX) 75 MG tablet, Take 75 mg by mouth daily., Disp: , Rfl:  .  diclofenac Sodium (VOLTAREN) 1 % GEL, Apply 4 g topically 4 (four) times daily., Disp: 100 g, Rfl: 0 .  doxycycline (VIBRAMYCIN) 100 MG capsule, Take 1 capsule (100 mg total) by mouth 2 (two) times daily.,  Disp: 20 capsule, Rfl: 0 .  glucose blood test strip, Use as instructed, Disp: 100 each, Rfl: 12 .  lidocaine (LIDODERM) 5 %, PLEASE SEE ATTACHED FOR DETAILED DIRECTIONS, Disp: , Rfl:  .  lidocaine (LIDODERM) 5 %, Place 1 patch onto the skin daily. Remove & Discard patch within 12 hours or as directed by MD, Disp: 30 patch, Rfl: 0 .  metFORMIN (GLUCOPHAGE) 500 MG tablet, Take 1 tablet (500 mg total) by mouth daily with breakfast for 7 days., Disp: 7 tablet, Rfl: 0 .  methocarbamol (ROBAXIN) 750 MG tablet, Take 1 tablet (750 mg total) by mouth 3 (three) times daily as needed (muscle spasm/pain)., Disp: 15 tablet, Rfl: 0 .  rosuvastatin (CRESTOR) 10 MG tablet, Take 10 mg by mouth at bedtime., Disp: , Rfl:    Allergies  Allergen Reactions  . Codeine Other (See Comments)    REACTION: Hallucinations  . Ibuprofen Other (See Comments)    REACTION: Elevated Blood Pressure  . Imdur [Isosorbide Nitrate] Other (See Comments)    Headache & upset stomach    Past Medical History:  Diagnosis Date  . Diabetes mellitus without complication (HCC)   . GERD 09/04/2006   Qualifier: Diagnosis of  By: Duke Salvia    . History of echocardiogram    a. 2D ECHO:  11/06/2013 EF 65%; no WMA. Mild TR. PA pk pressure 43 mm HG  . Hypertension   . Stroke [facility])      Past Surgical History:  Procedure Laterality Date  . ABDOMINAL HYSTERECTOMY    . LEFT HEART CATHETERIZATION WITH CORONARY ANGIOGRAM N/A 11/05/2013   Procedure: LEFT HEART CATHETERIZATION WITH CORONARY ANGIOGRAM;  Surgeon: Lennette Bihari, MD;  Location: [facility] CATH LAB;  Service: Cardiovascular;  Laterality: N/A;    History reviewed. No pertinent family history.   Social History   Tobacco Use  . Smoking status: Never Smoker  . Smokeless tobacco: Never Used  Substance Use Topics  . Alcohol use: No  . Drug use: No    ROS   Objective:   Vitals: BP (!) 178/67 (BP Location: Right Arm)   Pulse 77   Temp 97.8 F (36.6 C) (Oral)   Resp 18    SpO2 99%   Physical Exam Constitutional:      General: She is not in acute distress.    Appearance: Normal appearance. She is well-developed. She is not ill-appearing, toxic-appearing or diaphoretic.  HENT:     Head: Normocephalic and atraumatic.     Right Ear: External ear normal.     Left Ear: External ear normal.     Nose: Nose normal.     Mouth/Throat:     Mouth: Mucous membranes are moist.     Pharynx: Oropharynx is clear.  Eyes:     General: No scleral icterus.       Right eye: No discharge.        Left eye: No discharge.     Extraocular Movements: Extraocular movements intact.     Conjunctiva/sclera: Conjunctivae normal.     Pupils: Pupils are equal, round, and reactive to light.  Cardiovascular:     Rate and Rhythm: Normal rate and regular rhythm.     Pulses: Normal pulses.     Heart sounds: Normal heart sounds. No murmur heard.  No friction rub. No gallop.   Pulmonary:     Effort: Pulmonary effort is normal. No respiratory distress.     Breath sounds: Normal breath sounds. No stridor. No wheezing, rhonchi or rales.  Musculoskeletal:     Left shoulder: Tenderness (along deltoids) present. No swelling, deformity, effusion, laceration, bony tenderness or crepitus. Decreased range of motion (slightly decreased). Normal strength.     Cervical back: Spasms and tenderness present. No swelling, edema, deformity, erythema, signs of trauma, lacerations, rigidity, torticollis, bony tenderness or crepitus. Pain with movement present. Decreased range of motion.  Skin:    General: Skin is warm and dry.     Findings: No rash.  Neurological:     General: No focal deficit present.     Mental Status: She is alert and oriented to person, place, and time.     Cranial Nerves: No cranial nerve deficit.     Motor: No weakness.     Coordination: Coordination normal.     Gait: Gait normal.     Deep Tendon Reflexes: Reflexes normal.  Psychiatric:        Mood and Affect: Mood normal.         Behavior: Behavior normal.        Thought Content: Thought content normal.        Judgment: Judgment normal.     Results for orders placed or performed during the hospital encounter of 10/20/19 (from the past 24 hour(s))  POC CBG monitoring     Status:  Abnormal   Collection Time: 10/20/19  4:17 PM  Result Value Ref Range   Glucose-Capillary 171 (H) 70 - 99 mg/dL    DG Chest 2 View  Result Date: 10/12/2019 CLINICAL DATA:  Mid chest pain EXAM: CHEST - 2 VIEW COMPARISON:  10/06/2019 FINDINGS: Frontal and lateral views of the chest demonstrate a stable cardiac silhouette. No airspace disease, effusion, or pneumothorax. No acute bony abnormality. IMPRESSION: 1. No acute intrathoracic process. Electronically Signed   By: Sharlet SalinaMichael  Brown M.D.   On: 10/12/2019 18:49   DG Chest 2 View  Result Date: 09/29/2019 CLINICAL DATA:  Left chest and shoulder pain, fell EXAM: CHEST - 2 VIEW COMPARISON:  05/23/2018 FINDINGS: Frontal and lateral views of the chest demonstrate an unremarkable cardiac silhouette. No airspace disease, effusion, or pneumothorax. No acute displaced fracture. IMPRESSION: 1. No acute intrathoracic process. Electronically Signed   By: Sharlet SalinaMichael  Brown M.D.   On: 09/29/2019 17:58   DG Ribs Unilateral W/Chest Left  Result Date: 10/06/2019 CLINICAL DATA:  Pain following recent fall EXAM: LEFT RIBS AND CHEST - 3+ VIEW COMPARISON:  September 29, 2019. FINDINGS: Frontal chest as well as oblique and cone-down rib images were obtained. Lungs are clear. Heart size and pulmonary vascularity are normal. No adenopathy. There is aortic atherosclerosis. Small cervical rib noted on the left. There is no evident pneumothorax or pleural effusion. No evident rib fracture. IMPRESSION: No demonstrable rib fracture. Lungs clear. Small cervical rib on the left. Aortic Atherosclerosis (ICD10-I70.0). Electronically Signed   By: Bretta BangWilliam  Woodruff III M.D.   On: 10/06/2019 14:21   CT HEAD WO CONTRAST  Result Date:  10/13/2019 CLINICAL DATA:  Dizziness and weakness EXAM: CT HEAD WITHOUT CONTRAST TECHNIQUE: Contiguous axial images were obtained from the base of the skull through the vertex without intravenous contrast. COMPARISON:  09/23/2019 FINDINGS: Brain: Normal anatomic configuration. No abnormal intra or extra-axial mass lesion or fluid collection. No abnormal mass effect or midline shift. No evidence of acute intracranial hemorrhage or infarct. Ventricular size is normal. Cerebellum unremarkable. Vascular: Unremarkable Skull: Intact Sinuses/Orbits: There is complete opacification with wall thickening involving the visualized left maxillary sinus in keeping with changes of chronic sinusitis, incompletely evaluated on this exam. Remaining paranasal sinuses are clear. Orbits are unremarkable. Other: Mastoid air cells and middle ear cavities are clear. IMPRESSION: No acute intracranial abnormality. Chronic left maxillary sinusitis, incompletely evaluated on this exam. Electronically Signed   By: Helyn NumbersAshesh  Parikh MD   On: 10/13/2019 00:27   CT Head Wo Contrast  Result Date: 09/23/2019 CLINICAL DATA:  Headache and generalized body aches. Cerebral hemorrhage suspected EXAM: CT HEAD WITHOUT CONTRAST TECHNIQUE: Contiguous axial images were obtained from the base of the skull through the vertex without intravenous contrast. COMPARISON:  04/11/2019 FINDINGS: Brain: No evidence of acute infarction, hemorrhage, hydrocephalus, extra-axial collection or mass lesion/mass effect. Vascular: No hyperdense vessel or unexpected calcification. Skull: Normal. Negative for fracture or focal lesion. Sinuses/Orbits: Completely opacified left maxillary sinus with sclerotic wall thickening, chronic. There is also left ethmoid and inferior frontal opacification with narrowing of the left middle meatus. IMPRESSION: 1. Normal appearance of the brain. 2. 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 Electronically Signed    By: Marnee SpringJonathon  Watts M.D.   On: 09/23/2019 05:57   CT Cervical Spine Wo Contrast  Result Date: 10/13/2019 CLINICAL DATA:  Chest pain and tingling in her fingers. EXAM: CT CERVICAL SPINE WITHOUT CONTRAST TECHNIQUE: Multidetector CT imaging of the cervical spine was performed without  intravenous contrast. Multiplanar CT image reconstructions were also generated. COMPARISON:  Cervical spine MRI, dated October 19, 2012. FINDINGS: Alignment: Normal. Skull base and vertebrae: No acute fracture. Innumerable subcentimeter lytic areas are seen scattered throughout the cervical spine. Soft tissues and spinal canal: No prevertebral fluid or swelling. No visible canal hematoma. Disc levels: Moderate to marked severity endplate sclerosis is seen at the levels of C4-C5, C5-C6 and C6-C7. Moderate to marked severity intervertebral disc space narrowing is seen at the levels of C4-C5, C5-C6 and C6-C7. Mild bilateral multilevel facet joint hypertrophy is noted. Upper chest: Negative. Other: There is marked severity left maxillary sinus mucosal thickening. IMPRESSION: 1. Innumerable subcentimeter lytic areas are seen scattered throughout the cervical spine. Correlation with a nuclear medicine bone scan is recommended, as sequelae associated with osseous metastasis cannot be excluded. 2. 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 3. Marked severity left maxillary sinus disease. Electronically Signed   By: Aram Candela M.D.   On: 10/13/2019 00:11   DG Shoulder Left  Result Date: 10/06/2019 CLINICAL DATA:  Pain following recent fall EXAM: LEFT SHOULDER - 2+ VIEW COMPARISON:  March 03, 2018 FINDINGS: Oblique, Y scapular, and axillary images were obtained. Bones appear osteoporotic. No fracture or dislocation. There is mild narrowing of the acromioclavicular joint. The glenohumeral joint appears unremarkable. No erosive change. Visualized left lung clear. IMPRESSION: Mild  narrowing acromioclavicular joint. No fracture or dislocation. Bones appear osteoporotic. Electronically Signed   By: Bretta Bang III M.D.   On: 10/06/2019 14:19     Recent Results (from the past 2160 hour(s))  Basic metabolic panel     Status: Abnormal   Collection Time: 09/22/19  4:19 PM  Result Value Ref Range   Sodium 134 (L) 135 - 145 mmol/L   Potassium 3.1 (L) 3.5 - 5.1 mmol/L   Chloride 94 (L) 98 - 111 mmol/L   CO2 34 (H) 22 - 32 mmol/L   Glucose, Bld 245 (H) 70 - 99 mg/dL    Comment: Glucose reference range applies only to samples taken after fasting for at least 8 hours.   BUN 16 8 - 23 mg/dL   Creatinine, Ser 1.61 (H) 0.44 - 1.00 mg/dL   Calcium 9.0 8.9 - 09.6 mg/dL   GFR calc non Af Amer 51 (L) >60 mL/min   GFR calc Af Amer 59 (L) >60 mL/min   Anion gap 6 5 - 15    Comment: Performed at [facility], [address], [place], [place] [address]  CBC     Status: Abnormal   Collection Time: 09/22/19  4:19 PM  Result Value Ref Range   WBC 8.0 4.0 - 10.5 K/uL   RBC 3.79 (L) 3.87 - 5.11 MIL/uL   Hemoglobin 11.4 (L) 12.0 - 15.0 g/dL   HCT 98.1 (L) 36 - 46 %   MCV 94.5 80.0 - 100.0 fL   MCH 30.1 26.0 - 34.0 pg   MCHC 31.8 30.0 - 36.0 g/dL   RDW 19.1 47.8 - 29.5 %   Platelets 270 150 - 400 K/uL   nRBC 0.0 0.0 - 0.2 %    Comment: Performed at [facility], [address] [place], [place] [address]  Urinalysis, Routine w reflex microscopic     Status: None   Collection Time: 09/23/19  5:31 AM  Result Value Ref Range   Color, Urine YELLOW YELLOW   APPearance CLEAR CLEAR   Specific  Gravity, Urine 1.006 1.005 - 1.030   pH 7.0 5.0 - 8.0   Glucose, UA NEGATIVE NEGATIVE mg/dL   Hgb urine dipstick NEGATIVE NEGATIVE   Bilirubin Urine NEGATIVE NEGATIVE   Ketones, ur NEGATIVE NEGATIVE mg/dL   Protein, ur NEGATIVE NEGATIVE mg/dL   Nitrite NEGATIVE NEGATIVE   Leukocytes,Ua NEGATIVE NEGATIVE    Comment: Performed at [facility], [address]  [place], [place] [address]  CBG monitoring, ED     Status: Abnormal   Collection Time: 09/23/19  6:01 AM  Result Value Ref Range   Glucose-Capillary 183 (H) 70 - 99 mg/dL    Comment: Glucose reference range applies only to samples taken after fasting for at least 8 hours.   Comment 1 Notify RN    Comment 2 Document in Chart   Basic metabolic panel     Status: Abnormal   Collection Time: 09/29/19  5:32 PM  Result Value Ref Range   Sodium 136 135 - 145 mmol/L   Potassium 3.3 (L) 3.5 - 5.1 mmol/L   Chloride 97 (L) 98 - 111 mmol/L   CO2 28 22 - 32 mmol/L   Glucose, Bld 323 (H) 70 - 99 mg/dL    Comment: Glucose reference range applies only to samples taken after fasting for at least 8 hours.   BUN 12 8 - 23 mg/dL   Creatinine, Ser 0.09 (H) 0.44 - 1.00 mg/dL   Calcium 8.8 (L) 8.9 - 10.3 mg/dL   GFR calc non Af Amer 45 (L) >60 mL/min   GFR calc Af Amer 52 (L) >60 mL/min   Anion gap 11 5 - 15    Comment: Performed at [facility], [address], [place], [place] [address]  CBC     Status: Abnormal   Collection Time: 09/29/19  5:32 PM  Result Value Ref Range   WBC 8.8 4.0 - 10.5 K/uL   RBC 3.71 (L) 3.87 - 5.11 MIL/uL   Hemoglobin 11.4 (L) 12.0 - 15.0 g/dL   HCT 99.3 (L) 36 - 46 %   MCV 95.1 80.0 - 100.0 fL   MCH 30.7 26.0 - 34.0 pg   MCHC 32.3 30.0 - 36.0 g/dL   RDW 71.6 96.7 - 89.3 %   Platelets 272 150 - 400 K/uL   nRBC 0.0 0.0 - 0.2 %    Comment: Performed at [facility], [address], [place], [place] [address]  Troponin I (High Sensitivity)     Status: None   Collection Time: 09/29/19  5:32 PM  Result Value Ref Range   Troponin I (High Sensitivity) 6 <18 ng/L    Comment: (NOTE) Elevated high sensitivity troponin I (hsTnI) values and significant  changes across serial measurements may suggest ACS but many other  chronic and acute conditions are known to elevate hsTnI results.  Refer to the "Links" section for chest pain algorithms and additional   guidance. Performed at [facility], [address], [place], [place] [address]   Troponin I (High Sensitivity)     Status: None   Collection Time: 09/29/19  9:58 PM  Result Value Ref Range   Troponin I (High Sensitivity) 5 <18 ng/L    Comment: (NOTE) Elevated high sensitivity troponin I (hsTnI) values and significant  changes across serial measurements may suggest ACS but many other  chronic and acute conditions are known to elevate hsTnI results.  Refer to the "Links" section for chest pain algorithms and additional  guidance. Performed at [facility], [address] [place], [place] [address]   Basic metabolic panel     Status: Abnormal   Collection Time: 10/12/19  5:57 PM  Result Value Ref Range   Sodium 138 135 - 145 mmol/L   Potassium 3.4 (L) 3.5 - 5.1 mmol/L   Chloride 99 98 - 111 mmol/L   CO2 28 22 - 32 mmol/L   Glucose, Bld 201 (H) 70 - 99 mg/dL    Comment: Glucose reference range applies only to samples taken after fasting for at least 8 hours.   BUN 16 8 - 23 mg/dL   Creatinine, Ser 6.28 (H) 0.44 - 1.00 mg/dL   Calcium 8.9 8.9 - 36.6 mg/dL   GFR calc non Af Amer 53 (L) >60 mL/min   GFR calc Af Amer >60 >60 mL/min   Anion gap 11 5 - 15    Comment: Performed at [facility], [address], [place], [place] [address]  CBC     Status: Abnormal   Collection Time: 10/12/19  5:57 PM  Result Value Ref Range   WBC 7.7 4.0 - 10.5 K/uL   RBC 3.70 (L) 3.87 - 5.11 MIL/uL   Hemoglobin 11.1 (L) 12.0 - 15.0 g/dL   HCT 54.6 (L) 36 - 46 %   MCV 96.8 80.0 - 100.0 fL   MCH 30.0 26.0 - 34.0 pg   MCHC 31.0 30.0 - 36.0 g/dL   RDW 50.3 54.6 - 56.8 %   Platelets 267 150 - 400 K/uL   nRBC 0.7 (H) 0.0 - 0.2 %    Comment: Performed at [facility], [address], [place], [place] [address]  Troponin I (High Sensitivity)     Status: None   Collection Time: 10/12/19  5:57 PM  Result Value Ref Range   Troponin I (High Sensitivity) 9 <18 ng/L    Comment:  (NOTE) Elevated high sensitivity troponin I (hsTnI) values and significant  changes across serial measurements may suggest ACS but many other  chronic and acute conditions are known to elevate hsTnI results.  Refer to the "Links" section for chest pain algorithms and additional  guidance. Performed at [facility], [address] [place], [place] [address]     Assessment and Plan :   PDMP not reviewed this encounter.  1. Acute pain of left shoulder   2. Neck pain   3. Essential hypertension   4. Uncontrolled type 2 diabetes mellitus with hyperglycemia (HCC)   5. Degenerative disc disease, cervical   6. Arthritis of shoulder     Suspect patient has persistent pain related to degenerative changes. This is difficult to treat in light of her uncontrolled diabetes. However, emphasized need for better diabetic diet. Reviewed this with her. Needs a new PCP, refilled medications for her. Today, will use prednisone 30mg  daily x5 days. Reviewed all work up done with patient. Order for consult placed for help establishing her PCP. Counseled patient on potential for adverse effects with medications prescribed/recommended today, ER and return-to-clinic precautions discussed, patient verbalized understanding.    , Wallis Bamberg 10/20/19 [phone]

## 2019-10-26 ENCOUNTER — Emergency Department (HOSPITAL_COMMUNITY): Payer: Medicare HMO

## 2019-10-26 ENCOUNTER — Encounter (HOSPITAL_COMMUNITY): Payer: Self-pay

## 2019-10-26 ENCOUNTER — Emergency Department (HOSPITAL_COMMUNITY)
Admission: EM | Admit: 2019-10-26 | Discharge: 2019-10-27 | Disposition: A | Discharge status: Home / Self Care | Payer: Medicare HMO | Attending: Emergency Medicine | Admitting: Emergency Medicine

## 2019-10-26 ENCOUNTER — Other Ambulatory Visit: Payer: Self-pay

## 2019-10-26 DIAGNOSIS — I509 Heart failure, unspecified: Secondary | ICD-10-CM | POA: Insufficient documentation

## 2019-10-26 DIAGNOSIS — E119 Type 2 diabetes mellitus without complications: Secondary | ICD-10-CM | POA: Diagnosis not present

## 2019-10-26 DIAGNOSIS — I214 Non-ST elevation (NSTEMI) myocardial infarction: Secondary | ICD-10-CM | POA: Diagnosis not present

## 2019-10-26 DIAGNOSIS — Z8616 Personal history of COVID-19: Secondary | ICD-10-CM | POA: Insufficient documentation

## 2019-10-26 DIAGNOSIS — R202 Paresthesia of skin: Secondary | ICD-10-CM | POA: Diagnosis not present

## 2019-10-26 DIAGNOSIS — I110 Hypertensive heart disease with heart failure: Secondary | ICD-10-CM | POA: Insufficient documentation

## 2019-10-26 DIAGNOSIS — Z8673 Personal history of transient ischemic attack (TIA), and cerebral infarction without residual deficits: Secondary | ICD-10-CM | POA: Insufficient documentation

## 2019-10-26 DIAGNOSIS — R0789 Other chest pain: Secondary | ICD-10-CM

## 2019-10-26 DIAGNOSIS — I2510 Atherosclerotic heart disease of native coronary artery without angina pectoris: Secondary | ICD-10-CM | POA: Diagnosis not present

## 2019-10-26 DIAGNOSIS — Z79899 Other long term (current) drug therapy: Secondary | ICD-10-CM | POA: Insufficient documentation

## 2019-10-26 DIAGNOSIS — Z7984 Long term (current) use of oral hypoglycemic drugs: Secondary | ICD-10-CM | POA: Diagnosis not present

## 2019-10-26 LAB — BASIC METABOLIC PANEL
Anion gap: 14 (ref 5–15)
BUN: 24 mg/dL — ABNORMAL HIGH (ref 8–23)
CO2: 25 mmol/L (ref 22–32)
Calcium: 9 mg/dL (ref 8.9–10.3)
Chloride: 93 mmol/L — ABNORMAL LOW (ref 98–111)
Creatinine, Ser: 1.52 mg/dL — ABNORMAL HIGH (ref 0.44–1.00)
GFR calc Af Amer: 39 mL/min — ABNORMAL LOW (ref >60)
GFR calc non Af Amer: 34 mL/min — ABNORMAL LOW (ref >60)
Glucose, Bld: 299 mg/dL — ABNORMAL HIGH (ref 70–99)
Potassium: 3.5 mmol/L (ref 3.5–5.1)
Sodium: 132 mmol/L — ABNORMAL LOW (ref 135–145)

## 2019-10-26 LAB — CBC
HCT: 38.4 % (ref 36.0–46.0)
Hemoglobin: 12.3 g/dL (ref 12.0–15.0)
MCH: 29.9 pg (ref 26.0–34.0)
MCHC: 32 g/dL (ref 30.0–36.0)
MCV: 93.2 fL (ref 80.0–100.0)
Platelets: 293 10*3/uL (ref 150–400)
RBC: 4.12 MIL/uL (ref 3.87–5.11)
RDW: 12.6 % (ref 11.5–15.5)
WBC: 11.5 10*3/uL — ABNORMAL HIGH (ref 4.0–10.5)
nRBC: 0 % (ref 0.0–0.2)

## 2019-10-26 LAB — TROPONIN I (HIGH SENSITIVITY)
Troponin I (High Sensitivity): 7 ng/L (ref <18)
Troponin I (High Sensitivity): 6 ng/L (ref <18)

## 2019-10-26 NOTE — ED Notes (Signed)
X1 no answer

## 2019-10-26 NOTE — ED Triage Notes (Signed)
Pt presents with on going mid-sternum CP starting today associated with bilateral hand numbness. Pt reports she has been seen here several times for the same, prescribed something fore the numbness with no relief.

## 2019-10-26 NOTE — ED Notes (Signed)
Called for vitals no answer °

## 2019-10-27 MED ORDER — METFORMIN HCL 500 MG PO TABS: 750.0000 mg | ORAL_TABLET | Freq: Two times a day (BID) | ORAL | 0 refills | Status: DC

## 2019-10-27 NOTE — ED Provider Notes (Signed)
[facility] EMERGENCY DEPARTMENT Provider Note   CSN: [id number] Arrival date & time: 10/26/19  1221     History Chief Complaint  Patient presents with   Chest Pain   Numbness    Elaine Roanhorse is a 74 y.o. female.  Patient is a 74 year old female with past medical history of diabetes, prior CVA, hypertension, coronary artery disease.  She presents today for evaluation of bilateral hand numbness.  This has been present for several weeks and worsening.  Patient has been seen here on several occasions with similar complaints, however no definitive cause has been found.  She has had x-rays of her neck and shoulder, both of which were unremarkable and cardiac work-up which is also unremarkable.  Nothing she has been prescribed has helped.  Patient also describes a pressure in the center of her chest that has been present for several weeks as well.  She denies shortness of breath, nausea, or diaphoresis.  The history is provided by the patient.  Chest Pain      Past Medical History:  Diagnosis Date   Diabetes mellitus without complication (HCC)    GERD 09/04/2006   Qualifier: Diagnosis of  By: Duke Salvia     History of echocardiogram    a. 2D ECHO: 11/06/2013 EF 65%; no WMA. Mild TR. 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   Numbness 06/20/2012   Diabetes mellitus (HCC) 06/20/2012   CVA (cerebral infarction) 06/20/2012   Hypokalemia 06/20/2012   ARTHRALGIA 01/28/2009   CHEST PAIN UNSPECIFIED 11/26/2008   DEPRESSION 09/04/2006   Essential hypertension  09/04/2006   GERD 09/04/2006   MULTIPLE SITES, ARTHRITIS-ACUTE/CHRONIC 09/04/2006   LOW BACK PAIN, CHRONIC 09/04/2006    Past Surgical History:  Procedure Laterality Date   ABDOMINAL HYSTERECTOMY     LEFT HEART CATHETERIZATION WITH CORONARY ANGIOGRAM N/A 11/05/2013   Procedure: LEFT HEART CATHETERIZATION WITH CORONARY ANGIOGRAM;  Surgeon: Lennette Bihari, MD;  Location: [facility] CATH LAB;  Service: Cardiovascular;  Laterality: N/A;     OB History   No obstetric history on file.     History reviewed. No pertinent family history.  Social History   Tobacco Use   Smoking status: Never Smoker   Smokeless tobacco: Never Used  Substance Use Topics   Alcohol use: No   Drug use: No    Home Medications Prior to Admission medications   Medication Sig Start Date End Date Taking? Authorizing Provider  albuterol (VENTOLIN HFA) 108 (90 Base) MCG/ACT inhaler Inhale 1-2 puffs into the lungs every 6 (six) hours as needed for wheezing or shortness of breath. 10/20/19   Wallis Bamberg, PA-C  amLODipine (NORVASC) 10 MG tablet Take 10 mg by mouth daily.    [provider]  chlorthalidone (HYGROTON) 25 MG tablet Take 1 tablet (25 mg total) by mouth daily. 10/20/19   Wallis Bamberg, PA-C  cloNIDine (CATAPRES) 0.2 MG tablet Take 1 tablet (0.2 mg total) by mouth 2 (two) times daily. 10/20/19   Wallis Bamberg, PA-C  clopidogrel (PLAVIX) 75 MG tablet Take 75 mg by mouth daily. 03/16/19  [provider]  diclofenac Sodium (VOLTAREN) 1 % GEL Apply 4 g topically 4 (four) times daily. 09/30/19   Palumbo, April, MD  doxycycline (VIBRAMYCIN) 100 MG capsule Take 1 capsule (100 mg total) by mouth 2 (two) times daily. 04/11/19   Army MeliaMurphy, Laura A, PA-C  glucose blood test strip Use as instructed 04/11/19   Army MeliaMurphy, Laura A, PA-C  hydrALAZINE (APRESOLINE) 25 MG tablet Take 1 tablet (25 mg total) by mouth 3 (three) times daily. 02/10/18   Arby BarrettePfeiffer, Marcy, MD  hydrALAZINE (APRESOLINE) 50 MG tablet Take 50 mg by  mouth 2 (two) times daily. 03/22/19   [provider]  lidocaine (LIDODERM) 5 % PLEASE SEE ATTACHED FOR DETAILED DIRECTIONS 03/16/19   [provider]  lidocaine (LIDODERM) 5 % Place 1 patch onto the skin daily. Remove & Discard patch within 12 hours or as directed by MD 09/30/19   Nicanor AlconPalumbo, April, MD  losartan (COZAAR) 50 MG tablet Take 1 tablet (50 mg total) by mouth daily. 10/20/19   Wallis BambergMani, Mario, PA-C  metFORMIN (GLUCOPHAGE) 500 MG tablet Take 1 tablet (500 mg total) by mouth 2 (two) times daily with a meal. 10/20/19 11/19/19  Wallis BambergMani, Mario, PA-C  methocarbamol (ROBAXIN) 750 MG tablet Take 1 tablet (750 mg total) by mouth 3 (three) times daily as needed (muscle spasm/pain). 10/13/19   Cathren LaineSteinl, Kevin, MD  MYRBETRIQ 25 MG TB24 tablet Take 25 mg by mouth daily. 02/03/18   [provider]  potassium chloride (MICRO-K) 10 MEQ CR capsule Take 10 mEq by mouth daily. 03/16/19   [provider]  predniSONE (DELTASONE) 10 MG tablet Take 3 tablets (30 mg total) by mouth daily with breakfast. 10/20/19   Wallis BambergMani, Mario, PA-C  rosuvastatin (CRESTOR) 10 MG tablet Take 10 mg by mouth at bedtime. 03/22/19   [provider]    Allergies    Codeine, Ibuprofen, and Imdur [isosorbide nitrate]  Review of Systems   Review of Systems  Cardiovascular: Positive for chest pain.  All other systems reviewed and are negative.   Physical Exam Updated Vital Signs BP (!) 183/77 (BP Location: Right Arm) Comment: Simultaneous filing. User may not have seen previous data. Comment (BP Location): Simultaneous filing. User may not have seen previous data.   Pulse 68 Comment: Simultaneous filing. User may not have seen previous data.   Temp 97.6 F (36.4 C) (Oral)    Resp (!) 24 Comment: Simultaneous filing. User may not have seen previous data.   SpO2 98% Comment: Simultaneous filing. User may not have seen previous data.  Physical Exam Vitals and nursing note reviewed.  Constitutional:       General: She is not in acute distress.    Appearance: She is well-developed. She is not diaphoretic.  HENT:     Head: Normocephalic and atraumatic.  Cardiovascular:     Rate and Rhythm: Normal rate and regular rhythm.     Heart sounds: No murmur heard.  No friction rub. No gallop.   Pulmonary:     Effort: Pulmonary effort is normal. No respiratory distress.     Breath sounds: Normal breath sounds. No wheezing.  Abdominal:     General: Bowel sounds are normal. There is no distension.     Palpations: Abdomen is soft.     Tenderness: There is no abdominal tenderness.  Musculoskeletal:        General: Normal range of motion.     Cervical back: Normal range of motion and neck supple.  Right lower leg: No edema.     Left lower leg: No edema.  Skin:    General: Skin is warm and dry.  Neurological:     General: No focal deficit present.     Mental Status: She is alert and oriented to person, place, and time.     Cranial Nerves: No cranial nerve deficit.     Motor: No weakness.     ED Results / Procedures / Treatments   Labs (all labs ordered are listed, but only abnormal results are displayed) Labs Reviewed  BASIC METABOLIC PANEL - Abnormal; Notable for the following components:      Result Value   Sodium 132 (*)    Chloride 93 (*)    Glucose, Bld 299 (*)    BUN 24 (*)    Creatinine, Ser 1.52 (*)    GFR calc non Af Amer 34 (*)    GFR calc Af Amer 39 (*)    All other components within normal limits  CBC - Abnormal; Notable for the following components:   WBC 11.5 (*)    All other components within normal limits  TROPONIN I (HIGH SENSITIVITY)  TROPONIN I (HIGH SENSITIVITY)    ED ECG REPORT   Date: 10/27/2019  Rate: 83  Rhythm: normal sinus rhythm  QRS Axis: normal  Intervals: PR shortened  ST/T Wave abnormalities: normal  Conduction Disutrbances:none  Narrative Interpretation:   Old EKG Reviewed: none available  I have personally reviewed the EKG tracing and  agree with the computerized printout as noted.             Radiology DG Chest 2 View  Result Date: 10/26/2019 CLINICAL DATA:  Chest pain EXAM: CHEST - 2 VIEW COMPARISON:  October 12, 2019. FINDINGS: Lungs are clear. Heart size and pulmonary vascularity are normal. No adenopathy. No pneumothorax. No bone lesions. IMPRESSION: Lungs clear.  Cardiac silhouette normal. Electronically Signed   By: Bretta Bang III M.D.   On: 10/26/2019 13:13    Procedures Procedures (including critical care time)  Medications Ordered in ED Medications - No data to display  ED Course  I have reviewed the triage vital signs and the nursing notes.  Pertinent labs & imaging results that were available during my care of the patient were reviewed by me and considered in my medical decision making (see chart for details).    MDM Rules/Calculators/A&P  Patient presenting with complaints of bilateral hand numbness.  She describes a pins-and-needles sensation that has been present for the past several weeks.  She reports a fall prior to the onset of symptoms, but her imaging studies at that time were unremarkable.  On exam, patient has normal and equal sensation.  Strength is 5 out of 5 in both hands, and ulnar and radial pulses are easily palpable.  Capillary refill is brisk.  I am uncertain as to the exact etiology of this patient's numbness.  Upon reviewing her chart, she has had a persistently elevated glucose sometimes close to 400.  Symptoms could possibly be related to a peripheral neuropathy related to elevated blood sugars.  It sounds as though she has not been checking her sugars at home.  She has been taking Metformin 500 mg twice daily and sugars seem to run elevated.  I will recommend an increased dose of Metformin and close observation of her sugars at home.  Patient is to follow-up with a primary doctor to discuss these results and make further medication recommendations.  Nothing today  appears emergent.  I highly doubt a cardiac etiology.  Her troponin is negative x2 and EKG is unchanged.  Final Clinical Impression(s) / ED Diagnoses Final diagnoses:  None    Rx / DC Orders ED Discharge Orders    None       Geoffery Lyons, MD 10/27/19 0501

## 2019-10-27 NOTE — Discharge Instructions (Addendum)
I am concerned that your persistently elevated blood sugars could be resulting in the numbness you are describing in your hands.  The way to improve this would be to get better control over your blood sugars.  Increase your Metformin to 750 mg twice daily.  Keep a record of your blood sugars at home and take this with you to your next doctor's appointment to review.

## 2019-10-27 NOTE — ED Notes (Signed)
Patient verbalizes understanding of discharge instructions. Opportunity for questioning and answers were provided. Pt discharged from ED stable & ambulatory.   

## 2019-10-31 ENCOUNTER — Emergency Department (HOSPITAL_COMMUNITY): Payer: Medicare HMO

## 2019-10-31 ENCOUNTER — Emergency Department (HOSPITAL_COMMUNITY)
Admission: EM | Admit: 2019-10-31 | Discharge: 2019-11-01 | Disposition: A | Discharge status: Home / Self Care | Payer: Medicare HMO | Attending: Emergency Medicine | Admitting: Emergency Medicine

## 2019-10-31 ENCOUNTER — Other Ambulatory Visit: Payer: Self-pay

## 2019-10-31 DIAGNOSIS — R202 Paresthesia of skin: Secondary | ICD-10-CM | POA: Diagnosis present

## 2019-10-31 DIAGNOSIS — E119 Type 2 diabetes mellitus without complications: Secondary | ICD-10-CM | POA: Insufficient documentation

## 2019-10-31 DIAGNOSIS — I10 Essential (primary) hypertension: Secondary | ICD-10-CM | POA: Diagnosis not present

## 2019-10-31 DIAGNOSIS — R079 Chest pain, unspecified: Secondary | ICD-10-CM | POA: Diagnosis not present

## 2019-10-31 LAB — BASIC METABOLIC PANEL
Anion gap: 11 (ref 5–15)
BUN: 15 mg/dL (ref 8–23)
CO2: 28 mmol/L (ref 22–32)
Calcium: 8.9 mg/dL (ref 8.9–10.3)
Chloride: 96 mmol/L — ABNORMAL LOW (ref 98–111)
Creatinine, Ser: 1.41 mg/dL — ABNORMAL HIGH (ref 0.44–1.00)
GFR, Estimated: 37 mL/min — ABNORMAL LOW (ref >60)
Glucose, Bld: 224 mg/dL — ABNORMAL HIGH (ref 70–99)
Potassium: 3.4 mmol/L — ABNORMAL LOW (ref 3.5–5.1)
Sodium: 135 mmol/L (ref 135–145)

## 2019-10-31 LAB — CBC
HCT: 39.1 % (ref 36.0–46.0)
Hemoglobin: 12.5 g/dL (ref 12.0–15.0)
MCH: 30.4 pg (ref 26.0–34.0)
MCHC: 32 g/dL (ref 30.0–36.0)
MCV: 95.1 fL (ref 80.0–100.0)
Platelets: 294 10*3/uL (ref 150–400)
RBC: 4.11 MIL/uL (ref 3.87–5.11)
RDW: 12.5 % (ref 11.5–15.5)
WBC: 6.6 10*3/uL (ref 4.0–10.5)
nRBC: 0 % (ref 0.0–0.2)

## 2019-10-31 LAB — TROPONIN I (HIGH SENSITIVITY)
Troponin I (High Sensitivity): 6 ng/L (ref <18)
Troponin I (High Sensitivity): 6 ng/L (ref <18)

## 2019-10-31 NOTE — ED Triage Notes (Signed)
Pt called EMS for eval low bp and tingling in extremities. On EMS arrival pt began having chest pain, which resolved after 324 ASA.

## 2019-11-01 MED ORDER — GABAPENTIN 100 MG PO CAPS: 100.0000 mg | ORAL_CAPSULE | Freq: Three times a day (TID) | ORAL | 1 refills | Status: DC

## 2019-11-01 NOTE — Discharge Instructions (Addendum)
You were evaluated in the Emergency Department and after careful evaluation, we did not find any emergent condition requiring admission or further testing in the hospital.  Your exam/testing today was overall reassuring.  Your symptoms seem to be due to neuropathy.  Please take the gabapentin medication as needed and follow-up with a primary care doctor.  Please return to the Emergency Department if you experience any worsening of your condition.  Thank you for allowing [place] to be a part of your care.

## 2019-11-01 NOTE — ED Notes (Signed)
Patient verbalizes understanding of discharge instructions. Opportunity for questioning and answers were provided. Armband removed by staff, pt discharged from ED via wheelchair with [facility] taxi.

## 2019-11-01 NOTE — ED Notes (Signed)
Called [employer] to come pick up pt. Pt has taxi voucher. Pt will be placed in lobby. Estimated time is 5-10 mins.

## 2019-11-01 NOTE — ED Provider Notes (Signed)
[facility]-EMERGENCY DEPT [facility] Emergency Department Provider Note MRN:  [id number]  Arrival date & time: 11/01/19     Chief Complaint   Chest Pain and Tingling   History of Present Illness   [name] is a 74 y.o. year-old female with a history of diabetes presenting to the ED with chief complaint of chest pain.  Several weeks of central chest pain, described as a soreness, worse with palpation.  Also endorsing several weeks of paresthesias to the bilateral hands and feet.  Explains that the Metformin medication does not seem to be helping the pain.  Denies any fever, no shortness of breath, no dizziness or diaphoresis, no abdominal pain, no numbness or weakness to the arms or legs.  Review of Systems  A complete 10 system review of systems was obtained and all systems are negative except as noted in the HPI and PMH.   Patient's Health History    Past Medical History:  Diagnosis Date  . Diabetes mellitus without complication (HCC)   . GERD 09/04/2006   Qualifier: Diagnosis of  By: Duke Salvia    . History of echocardiogram    a. 2D ECHO: 11/06/2013 EF 65%; no WMA. Mild TR. PA pk pressure 43 mm HG  . Hypertension   . Stroke [facility])     Past Surgical History:  Procedure Laterality Date  . ABDOMINAL HYSTERECTOMY    . LEFT HEART CATHETERIZATION WITH CORONARY ANGIOGRAM N/A 11/05/2013   Procedure: LEFT HEART CATHETERIZATION WITH CORONARY ANGIOGRAM;  Surgeon: Lennette Bihari, MD;  Location: [facility] CATH LAB;  Service: Cardiovascular;  Laterality: N/A;    No family history on file.  Social History   Socioeconomic History  . Marital status: Widowed    Spouse name: Not on file  . Number of children: Not on file  . Years of education: Not on file  . Highest education level: Not on file  Occupational History  . Not on file  Tobacco Use  . Smoking status: Never Smoker  . Smokeless tobacco: Never Used  Substance and Sexual Activity  . Alcohol use: No  . Drug use: No  . Sexual  activity: Never  Other Topics Concern  . Not on file  Social History Narrative  . Not on file   Social Determinants of Health   Financial Resource Strain:   . Difficulty of Paying Living Expenses: Not on file  Food Insecurity:   . Worried About Programme researcher, broadcasting/film/video in the Last Year: Not on file  . Ran Out of Food in the Last Year: Not on file  Transportation Needs:   . Lack of Transportation (Medical): Not on file  . Lack of Transportation (Non-Medical): Not on file  Physical Activity:   . Days of Exercise per Week: Not on file  . Minutes of Exercise per Session: Not on file  Stress:   . Feeling of Stress : Not on file  Social Connections:   . Frequency of Communication with Friends and Family: Not on file  . Frequency of Social Gatherings with Friends and Family: Not on file  . Attends Religious Services: Not on file  . Active Member of Clubs or Organizations: Not on file  . Attends Banker Meetings: Not on file  . Marital Status: Not on file  Intimate Partner Violence:   . Fear of Current or Ex-Partner: Not on file  . Emotionally Abused: Not on file  . Physically Abused: Not on file  . Sexually Abused: Not on  file     Physical Exam   Vitals:   10/31/19 2005 10/31/19 2253  BP: 97/60 (!) 120/58  Pulse: 75 68  Resp: 16 18  Temp:    SpO2: 97% 97%    CONSTITUTIONAL: Well-appearing, NAD NEURO:  Alert and oriented x 3, normal and symmetric strength and sensation, normal coordination, normal speech EYES:  eyes equal and reactive ENT/NECK:  no LAD, no JVD CARDIO: Regular rate, well-perfused, normal S1 and S2 PULM:  CTAB no wheezing or rhonchi GI/GU:  normal bowel sounds, non-distended, non-tender MSK/SPINE:  No gross deformities, no edema SKIN:  no rash, atraumatic PSYCH:  Appropriate speech and behavior  *Additional and/or pertinent findings included in MDM below  Diagnostic and Interventional Summary    EKG Interpretation  Date/Time:  "Sunday  November 01 2019 00:58:25 EDT Ventricular Rate:  66 PR Interval:    QRS Duration: 70 QT Interval:  399 QTC Calculation: 418 R Axis:   59 Text Interpretation: Sinus rhythm Borderline short PR interval Confirmed by Rhyen Mazariego ([id number]) on 11/01/2019 1:01:37 AM      Labs Reviewed  BASIC METABOLIC PANEL - Abnormal; Notable for the following components:      Result Value   Potassium 3.4 (*)    Chloride 96 (*)    Glucose, Bld 224 (*)    Creatinine, Ser 1.41 (*)    GFR, Estimated 37 (*)    All other components within normal limits  CBC  TROPONIN I (HIGH SENSITIVITY)  TROPONIN I (HIGH SENSITIVITY)    DG Chest 2 View  Final Result      Medications - No data to display   Procedures  /  Critical Care Procedures  ED Course and Medical Decision Making  I have reviewed the triage vital signs, the nursing notes, and pertinent available records from the EMR.  Listed above are laboratory and imaging tests that I personally ordered, reviewed, and interpreted and then considered in my medical decision making (see below for details).  No focal neurological deficits, chest pain is atypical and reproducible with palpation.  EKG is without ischemic features, troponin negative x2, no evidence of DVT, doubt PE.  Has had similar chest pain for quite a while now, doubt ACS.  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  No new neurological deficits to warrant imaging today.       Maralyn Witherell M. Aliea Bobe, MD [facility] [email or web address]  Final Clinical Impressions(s) / ED Diagnoses     ICD-10-CM   1. Paresthesia  R20.2   2. Chest pain, unspecified type  R07.9     ED Discharge Orders         Ordered    gabapentin (NEURONTIN) 100 MG capsule  3 times daily        10" /10/21 0109           Discharge Instructions Discussed with and Provided to Patient:      Discharge Instructions     You were evaluated in the Emergency Department and after careful evaluation, we did not find any emergent condition requiring admission or further testing in the hospital.  Your exam/testing today was overall reassuring.  Your symptoms seem to be due to neuropathy.  Please take the gabapentin medication as needed and follow-up with a primary care doctor.  Please return to the Emergency Department if you experience any worsening of your condition.  Thank you for  allowing [place] to be a part of your care.       Sabas Sous, MD 11/01/19 0111

## 2019-11-01 NOTE — ED Notes (Signed)
Discharge instructions discussed with pt. Pt was able to verbalize understanding with no questions at this time. Pt verbalize concern in regards to not having PCP pt provided community health clinic information on discharge instructions.

## 2019-11-08 ENCOUNTER — Emergency Department (HOSPITAL_COMMUNITY)
Admission: EM | Admit: 2019-11-08 | Discharge: 2019-11-08 | Disposition: A | Discharge status: Home / Self Care | Payer: Medicare HMO | Source: Home / Self Care | Attending: Emergency Medicine | Admitting: Emergency Medicine

## 2019-11-08 ENCOUNTER — Other Ambulatory Visit: Payer: Self-pay

## 2019-11-08 ENCOUNTER — Encounter (HOSPITAL_COMMUNITY): Payer: Self-pay | Admitting: Emergency Medicine

## 2019-11-08 DIAGNOSIS — E1165 Type 2 diabetes mellitus with hyperglycemia: Secondary | ICD-10-CM | POA: Insufficient documentation

## 2019-11-08 DIAGNOSIS — R739 Hyperglycemia, unspecified: Secondary | ICD-10-CM

## 2019-11-08 DIAGNOSIS — Z7902 Long term (current) use of antithrombotics/antiplatelets: Secondary | ICD-10-CM | POA: Insufficient documentation

## 2019-11-08 DIAGNOSIS — R202 Paresthesia of skin: Secondary | ICD-10-CM | POA: Insufficient documentation

## 2019-11-08 DIAGNOSIS — Z8616 Personal history of covid-19: Secondary | ICD-10-CM | POA: Insufficient documentation

## 2019-11-08 DIAGNOSIS — Z8673 Personal history of transient ischemic attack (TIA), and cerebral infarction without residual deficits: Secondary | ICD-10-CM | POA: Insufficient documentation

## 2019-11-08 DIAGNOSIS — M4802 Spinal stenosis, cervical region: Secondary | ICD-10-CM | POA: Diagnosis not present

## 2019-11-08 DIAGNOSIS — Z79899 Other long term (current) drug therapy: Secondary | ICD-10-CM | POA: Insufficient documentation

## 2019-11-08 DIAGNOSIS — Z7984 Long term (current) use of oral hypoglycemic drugs: Secondary | ICD-10-CM | POA: Insufficient documentation

## 2019-11-08 DIAGNOSIS — I10 Essential (primary) hypertension: Secondary | ICD-10-CM | POA: Insufficient documentation

## 2019-11-08 LAB — BASIC METABOLIC PANEL
Anion gap: 11 (ref 5–15)
BUN: 14 mg/dL (ref 8–23)
CO2: 28 mmol/L (ref 22–32)
Calcium: 8.8 mg/dL — ABNORMAL LOW (ref 8.9–10.3)
Chloride: 95 mmol/L — ABNORMAL LOW (ref 98–111)
Creatinine, Ser: 1.33 mg/dL — ABNORMAL HIGH (ref 0.44–1.00)
GFR, Estimated: 40 mL/min — ABNORMAL LOW (ref >60)
Glucose, Bld: 256 mg/dL — ABNORMAL HIGH (ref 70–99)
Potassium: 3.3 mmol/L — ABNORMAL LOW (ref 3.5–5.1)
Sodium: 134 mmol/L — ABNORMAL LOW (ref 135–145)

## 2019-11-08 LAB — CBC
HCT: 36.5 % (ref 36.0–46.0)
Hemoglobin: 11.7 g/dL — ABNORMAL LOW (ref 12.0–15.0)
MCH: 29.9 pg (ref 26.0–34.0)
MCHC: 32.1 g/dL (ref 30.0–36.0)
MCV: 93.4 fL (ref 80.0–100.0)
Platelets: 213 10*3/uL (ref 150–400)
RBC: 3.91 MIL/uL (ref 3.87–5.11)
RDW: 12.2 % (ref 11.5–15.5)
WBC: 7.8 10*3/uL (ref 4.0–10.5)
nRBC: 0 % (ref 0.0–0.2)

## 2019-11-08 MED ORDER — OXYCODONE-ACETAMINOPHEN 5-325 MG PO TABS
1.0000 | ORAL_TABLET | Freq: Once | ORAL | Status: AC
  Administered 2019-11-08: 1 via ORAL
  Filled 2019-11-08: qty 1

## 2019-11-08 NOTE — ED Provider Notes (Signed)
[facility] EMERGENCY DEPARTMENT Provider Note   CSN: [id number] Arrival date & time: 11/08/19  1553     History Chief Complaint  Patient presents with   Fall   Hyperglycemia    [name] is a 74 y.o. female.  HPI History from patient Old records reviewed ED visits of 10 9, 10 4, 928, 920, 914, and 917 of 2021 or reviewed   74 year old female history of type 2 diabetes presents today complaining of bilateral hand paresthesias.  She states this is occurred since she had a fall.  She is unable to tell me when the fall occurred.  States she has been seen here several times for this.  The numbness and tingling in her hands continues.  She is also complaining of some paresthesias in her feet.  She denies any weakness in her arms.  She denies any new injury.  Previous records to reference a fall, but remains unclear when the actual fall occurred.  First mention that I note is in note of September 14 when she stated that she had fallen at a [facility] near her house, but even at that time, she is not sure when it had occurred.  Review of CT scan of cervical spine from 10/13/2019 shows innumerable subcentimeter lytic areas scattered throughout the cervical spine.  They advised that correlation with nuclear medicine bone scan is recommended as sequela associated with osseous metastases cannot be excluded.  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  In conversation with the patient does not appear that she has been able to obtain any follow-up.  She states that she has gone her primary care office but it is closed and does not appear that they are in business anymore.. Past Medical History:  Diagnosis Date   Diabetes mellitus without complication (HCC)    GERD 09/04/2006   Qualifier:  Diagnosis of  By: Duke Salvia     History of echocardiogram    a. 2D ECHO: 11/06/2013 EF 65%; no WMA. Mild TR. 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   Numbness 06/20/2012   Diabetes mellitus (HCC) 06/20/2012   CVA (cerebral infarction) 06/20/2012   Hypokalemia 06/20/2012   ARTHRALGIA 01/28/2009   CHEST PAIN UNSPECIFIED 11/26/2008   DEPRESSION 09/04/2006   Essential hypertension 09/04/2006   GERD 09/04/2006   MULTIPLE SITES, ARTHRITIS-ACUTE/CHRONIC 09/04/2006   LOW BACK PAIN, CHRONIC 09/04/2006    Past Surgical History:  Procedure Laterality Date   ABDOMINAL HYSTERECTOMY     LEFT HEART CATHETERIZATION WITH CORONARY ANGIOGRAM N/A 11/05/2013   Procedure: LEFT HEART CATHETERIZATION WITH CORONARY ANGIOGRAM;  Surgeon: Lennette Bihari, MD;  Location: [facility] CATH LAB;  Service: Cardiovascular;  Laterality: N/A;     OB History   No obstetric history on file.     History reviewed. No pertinent family history.  Social History   Tobacco Use   Smoking status: Never Smoker  Smokeless tobacco: Never Used  Substance Use Topics   Alcohol use: No   Drug use: No    Home Medications Prior to Admission medications   Medication Sig Start Date End Date Taking? Authorizing Provider  albuterol (VENTOLIN HFA) 108 (90 Base) MCG/ACT inhaler Inhale 1-2 puffs into the lungs every 6 (six) hours as needed for wheezing or shortness of breath. 10/20/19   Wallis Bamberg, PA-C  amLODipine (NORVASC) 10 MG tablet Take 10 mg by mouth daily.    [provider]  chlorthalidone (HYGROTON)  25 MG tablet Take 1 tablet (25 mg total) by mouth daily. 10/20/19   Wallis Bamberg, PA-C  cloNIDine (CATAPRES) 0.2 MG tablet Take 1 tablet (0.2 mg total) by mouth 2 (two) times daily. 10/20/19   Wallis Bamberg, PA-C  clopidogrel (PLAVIX) 75 MG tablet Take 75 mg by mouth daily. 03/16/19   [provider]  diclofenac Sodium (VOLTAREN) 1 % GEL Apply 4 g topically 4 (four) times daily. 09/30/19   Palumbo, April, MD  doxycycline (VIBRAMYCIN) 100 MG capsule Take 1 capsule (100 mg total) by mouth 2 (two) times daily. 04/11/19   [name], PA-C  gabapentin (NEURONTIN) 100 MG capsule Take 1 capsule (100 mg total) by mouth 3 (three) times daily. 11/01/19 12/31/19  Sabas Sous, MD  glucose blood test strip Use as instructed 04/11/19   Army Melia A, PA-C  hydrALAZINE (APRESOLINE) 25 MG tablet Take 1 tablet (25 mg total) by mouth 3 (three) times daily. 02/10/18   Arby Barrette, MD  hydrALAZINE (APRESOLINE) 50 MG tablet Take 50 mg by mouth 2 (two) times daily. 03/22/19   [provider]  lidocaine (LIDODERM) 5 % PLEASE SEE ATTACHED FOR DETAILED DIRECTIONS 03/16/19   [provider]  lidocaine (LIDODERM) 5 % Place 1 patch onto the skin daily. Remove & Discard patch within 12 hours or as directed by MD 09/30/19   Nicanor Alcon, April, MD  losartan (COZAAR) 50 MG tablet Take 1 tablet (50 mg total) by mouth daily. 10/20/19   Wallis Bamberg, PA-C  metFORMIN (GLUCOPHAGE) 500 MG tablet Take 1.5 tablets (750 mg total) by mouth 2 (two) times daily with a meal. 10/27/19 11/26/19  Geoffery Lyons, MD  methocarbamol (ROBAXIN) 750 MG tablet Take 1 tablet (750 mg total) by mouth 3 (three) times daily as needed (muscle spasm/pain). 10/13/19   Cathren Laine, MD  MYRBETRIQ 25 MG TB24 tablet Take 25 mg by mouth daily. 02/03/18   [provider]  potassium chloride (MICRO-K) 10 MEQ CR capsule Take 10 mEq by mouth daily. 03/16/19   [provider]  predniSONE (DELTASONE) 10 MG tablet Take 3 tablets (30 mg  total) by mouth daily with breakfast. 10/20/19   Wallis Bamberg, PA-C  rosuvastatin (CRESTOR) 10 MG tablet Take 10 mg by mouth at bedtime. 03/22/19   [provider]    Allergies    Codeine, Ibuprofen, and Imdur [isosorbide nitrate]  Review of Systems   Review of Systems  All other systems reviewed and are negative.   Physical Exam Updated Vital Signs BP (!) 104/42    Pulse 81    Temp 98.3 F (36.8 C) (Oral)    Resp (!) 22    SpO2 99%   Physical Exam Vitals and nursing note reviewed.  Constitutional:      General: She is not in acute distress.    Appearance: Normal appearance.  HENT:     Head: Normocephalic and atraumatic.     Right Ear: External ear  normal.     Left Ear: External ear normal.     Nose: Nose normal.     Mouth/Throat:     Mouth: Mucous membranes are moist.  Eyes:     Extraocular Movements: Extraocular movements intact.     Pupils: Pupils are equal, round, and reactive to light.  Cardiovascular:     Rate and Rhythm: Normal rate and regular rhythm.     Pulses: Normal pulses.     Heart sounds: Normal heart sounds.  Pulmonary:     Effort: Pulmonary effort is normal.  Abdominal:     General: Abdomen is flat.     Palpations: Abdomen is soft.  Musculoskeletal:        General: Normal range of motion.     Cervical back: Normal range of motion.  Skin:    General: Skin is warm and dry.     Capillary Refill: Capillary refill takes less than 2 seconds.  Neurological:     General: No focal deficit present.     Mental Status: She is alert and oriented to person, place, and time. Mental status is at baseline.     Cranial Nerves: No cranial nerve deficit.     Sensory: No sensory deficit.     Motor: No weakness.     Coordination: Coordination normal.     Gait: Gait normal.     Deep Tendon Reflexes: Reflexes normal.  Psychiatric:        Mood and Affect: Mood normal.     ED Results / Procedures / Treatments   Labs (all labs ordered are listed, but only  abnormal results are displayed) Labs Reviewed  BASIC METABOLIC PANEL - Abnormal; Notable for the following components:      Result Value   Sodium 134 (*)    Potassium 3.3 (*)    Chloride 95 (*)    Glucose, Bld 256 (*)    Creatinine, Ser 1.33 (*)    Calcium 8.8 (*)    GFR, Estimated 40 (*)    All other components within normal limits  CBC - Abnormal; Notable for the following components:   Hemoglobin 11.7 (*)    All other components within normal limits  URINALYSIS, ROUTINE W REFLEX MICROSCOPIC    EKG None  Radiology No results found.  Procedures Procedures (including critical care time)  Medications Ordered in ED Medications - No data to display  ED Course  I have reviewed the triage vital signs and the nursing notes.  Pertinent labs & imaging results that were available during my care of the patient were reviewed by me and considered in my medical decision making (see chart for details).  Clinical Course as of Nov 07 1801  Wynelle LinkSun Nov 08, 2019  1801 [facility] CT of cervical spine   [DR]    Clinical Course User Index [DR] Margarita Grizzleay, Shadeed Colberg, MD   MDM Rules/Calculators/A&P                          Plan neurontin qhs and will arrange op follow up for bone scan and further evaluation. Diabetes appears stable with hyperglycemia at 256- patient reports taking her metformin - will review dose. Went over medications multiple times. Is unclear to me if she understands exactly which medication she is taking or the doses that she is supposed to be taking. I circled and went over the Neurontin and Metformin with the patient individually I also  wrote down the number to the [facility] where she is followed. I advised her to call first thing in the morning to get in for recheck, ordering of further neck imaging   Final Clinical Impression(s) / ED Diagnoses Final diagnoses:  Paresthesia  Hyperglycemia    Rx / DC Orders ED  Discharge Orders    None       Margarita Grizzle, MD 11/08/19 1819

## 2019-11-08 NOTE — Discharge Instructions (Addendum)
Please call your clinic tomorrow at [phone] for recheck and ordering of bone scan of neck Take your metformin and other medications as prescribed

## 2019-11-08 NOTE — ED Triage Notes (Signed)
Pt to triage via [facility] from home.  Reports neck pain and tingling/numbness to bilateral upper extremities since fall 2 weeks ago.  Per EMS pt was supposed to f/u with PCP and get Metformin for her diabetes.  States that hasn't been done and upon further investigation she needs a new PCP but unsure how to get one.  CBG 316 per EMS.

## 2019-11-09 ENCOUNTER — Inpatient Hospital Stay (HOSPITAL_COMMUNITY)
Admission: EM | Admit: 2019-11-09 | Discharge: 2019-11-26 | DRG: 472 | Disposition: A | Discharge status: Home / Self Care | Payer: Medicare HMO | Attending: Neurosurgery | Admitting: Neurosurgery

## 2019-11-09 ENCOUNTER — Other Ambulatory Visit: Payer: Self-pay

## 2019-11-09 ENCOUNTER — Encounter (HOSPITAL_COMMUNITY): Payer: Self-pay | Admitting: Emergency Medicine

## 2019-11-09 DIAGNOSIS — M5001 Cervical disc disorder with myelopathy,  high cervical region: Secondary | ICD-10-CM | POA: Diagnosis present

## 2019-11-09 DIAGNOSIS — Z7984 Long term (current) use of oral hypoglycemic drugs: Secondary | ICD-10-CM

## 2019-11-09 DIAGNOSIS — Z20822 Contact with and (suspected) exposure to covid-19: Secondary | ICD-10-CM | POA: Diagnosis present

## 2019-11-09 DIAGNOSIS — R202 Paresthesia of skin: Secondary | ICD-10-CM

## 2019-11-09 DIAGNOSIS — M25512 Pain in left shoulder: Secondary | ICD-10-CM | POA: Diagnosis present

## 2019-11-09 DIAGNOSIS — Z7902 Long term (current) use of antithrombotics/antiplatelets: Secondary | ICD-10-CM

## 2019-11-09 DIAGNOSIS — Z8673 Personal history of transient ischemic attack (TIA), and cerebral infarction without residual deficits: Secondary | ICD-10-CM

## 2019-11-09 DIAGNOSIS — I252 Old myocardial infarction: Secondary | ICD-10-CM

## 2019-11-09 DIAGNOSIS — Z79899 Other long term (current) drug therapy: Secondary | ICD-10-CM

## 2019-11-09 DIAGNOSIS — Z888 Allergy status to other drugs, medicaments and biological substances status: Secondary | ICD-10-CM

## 2019-11-09 DIAGNOSIS — Z885 Allergy status to narcotic agent status: Secondary | ICD-10-CM

## 2019-11-09 DIAGNOSIS — K219 Gastro-esophageal reflux disease without esophagitis: Secondary | ICD-10-CM | POA: Diagnosis present

## 2019-11-09 DIAGNOSIS — Z90710 Acquired absence of both cervix and uterus: Secondary | ICD-10-CM

## 2019-11-09 DIAGNOSIS — M4712 Other spondylosis with myelopathy, cervical region: Secondary | ICD-10-CM

## 2019-11-09 DIAGNOSIS — Z23 Encounter for immunization: Secondary | ICD-10-CM

## 2019-11-09 DIAGNOSIS — G8929 Other chronic pain: Secondary | ICD-10-CM | POA: Diagnosis present

## 2019-11-09 DIAGNOSIS — I10 Essential (primary) hypertension: Secondary | ICD-10-CM | POA: Diagnosis present

## 2019-11-09 DIAGNOSIS — M4802 Spinal stenosis, cervical region: Principal | ICD-10-CM | POA: Diagnosis present

## 2019-11-09 DIAGNOSIS — Z8616 Personal history of COVID-19: Secondary | ICD-10-CM

## 2019-11-09 DIAGNOSIS — E119 Type 2 diabetes mellitus without complications: Secondary | ICD-10-CM | POA: Diagnosis present

## 2019-11-09 DIAGNOSIS — M5011 Cervical disc disorder with radiculopathy,  high cervical region: Secondary | ICD-10-CM | POA: Diagnosis present

## 2019-11-09 DIAGNOSIS — Z419 Encounter for procedure for purposes other than remedying health state, unspecified: Secondary | ICD-10-CM

## 2019-11-09 NOTE — ED Triage Notes (Signed)
Patient arrived with EMS from home reports central chest pain this evening with mild SOB , no emesis or diaphoresis , patient added generalized weakness and fatigue .

## 2019-11-10 ENCOUNTER — Emergency Department (HOSPITAL_COMMUNITY): Payer: Medicare HMO

## 2019-11-10 ENCOUNTER — Other Ambulatory Visit: Payer: Self-pay | Admitting: Neurosurgery

## 2019-11-10 DIAGNOSIS — R202 Paresthesia of skin: Secondary | ICD-10-CM | POA: Diagnosis present

## 2019-11-10 DIAGNOSIS — K219 Gastro-esophageal reflux disease without esophagitis: Secondary | ICD-10-CM | POA: Diagnosis present

## 2019-11-10 DIAGNOSIS — Z79899 Other long term (current) drug therapy: Secondary | ICD-10-CM | POA: Diagnosis not present

## 2019-11-10 DIAGNOSIS — M4802 Spinal stenosis, cervical region: Secondary | ICD-10-CM | POA: Diagnosis present

## 2019-11-10 DIAGNOSIS — Z7984 Long term (current) use of oral hypoglycemic drugs: Secondary | ICD-10-CM | POA: Diagnosis not present

## 2019-11-10 DIAGNOSIS — Z90710 Acquired absence of both cervix and uterus: Secondary | ICD-10-CM | POA: Diagnosis not present

## 2019-11-10 DIAGNOSIS — I10 Essential (primary) hypertension: Secondary | ICD-10-CM | POA: Diagnosis present

## 2019-11-10 DIAGNOSIS — M5001 Cervical disc disorder with myelopathy,  high cervical region: Secondary | ICD-10-CM | POA: Diagnosis present

## 2019-11-10 DIAGNOSIS — Z888 Allergy status to other drugs, medicaments and biological substances status: Secondary | ICD-10-CM | POA: Diagnosis not present

## 2019-11-10 DIAGNOSIS — Z8616 Personal history of COVID-19: Secondary | ICD-10-CM | POA: Diagnosis not present

## 2019-11-10 DIAGNOSIS — Z20822 Contact with and (suspected) exposure to covid-19: Secondary | ICD-10-CM | POA: Diagnosis present

## 2019-11-10 DIAGNOSIS — E119 Type 2 diabetes mellitus without complications: Secondary | ICD-10-CM | POA: Diagnosis present

## 2019-11-10 DIAGNOSIS — Z23 Encounter for immunization: Secondary | ICD-10-CM | POA: Diagnosis present

## 2019-11-10 DIAGNOSIS — M25512 Pain in left shoulder: Secondary | ICD-10-CM | POA: Diagnosis present

## 2019-11-10 DIAGNOSIS — I252 Old myocardial infarction: Secondary | ICD-10-CM | POA: Diagnosis not present

## 2019-11-10 DIAGNOSIS — Z885 Allergy status to narcotic agent status: Secondary | ICD-10-CM | POA: Diagnosis not present

## 2019-11-10 DIAGNOSIS — G8929 Other chronic pain: Secondary | ICD-10-CM | POA: Diagnosis present

## 2019-11-10 DIAGNOSIS — Z7902 Long term (current) use of antithrombotics/antiplatelets: Secondary | ICD-10-CM | POA: Diagnosis not present

## 2019-11-10 DIAGNOSIS — M5011 Cervical disc disorder with radiculopathy,  high cervical region: Secondary | ICD-10-CM | POA: Diagnosis present

## 2019-11-10 DIAGNOSIS — Z8673 Personal history of transient ischemic attack (TIA), and cerebral infarction without residual deficits: Secondary | ICD-10-CM | POA: Diagnosis not present

## 2019-11-10 LAB — PROTIME-INR
INR: 0.9 (ref 0.8–1.2)
Prothrombin Time: 11.5 seconds (ref 11.4–15.2)

## 2019-11-10 LAB — BASIC METABOLIC PANEL
Anion gap: 12 (ref 5–15)
BUN: 21 mg/dL (ref 8–23)
CO2: 28 mmol/L (ref 22–32)
Calcium: 8.8 mg/dL — ABNORMAL LOW (ref 8.9–10.3)
Chloride: 94 mmol/L — ABNORMAL LOW (ref 98–111)
Creatinine, Ser: 1.53 mg/dL — ABNORMAL HIGH (ref 0.44–1.00)
GFR, Estimated: 34 mL/min — ABNORMAL LOW (ref >60)
Glucose, Bld: 173 mg/dL — ABNORMAL HIGH (ref 70–99)
Potassium: 3.5 mmol/L (ref 3.5–5.1)
Sodium: 134 mmol/L — ABNORMAL LOW (ref 135–145)

## 2019-11-10 LAB — RESPIRATORY PANEL BY RT PCR (FLU A&B, COVID)
Influenza A by PCR: NEGATIVE
Influenza B by PCR: NEGATIVE
SARS Coronavirus 2 by RT PCR: NEGATIVE

## 2019-11-10 LAB — CBC
HCT: 37.9 % (ref 36.0–46.0)
Hemoglobin: 12.3 g/dL (ref 12.0–15.0)
MCH: 30.2 pg (ref 26.0–34.0)
MCHC: 32.5 g/dL (ref 30.0–36.0)
MCV: 93.1 fL (ref 80.0–100.0)
Platelets: 218 K/uL (ref 150–400)
RBC: 4.07 MIL/uL (ref 3.87–5.11)
RDW: 12.3 % (ref 11.5–15.5)
WBC: 10.1 K/uL (ref 4.0–10.5)
nRBC: 0 % (ref 0.0–0.2)

## 2019-11-10 LAB — TROPONIN I (HIGH SENSITIVITY)
Troponin I (High Sensitivity): 11 ng/L
Troponin I (High Sensitivity): 9 ng/L (ref <18)

## 2019-11-10 LAB — MRSA PCR SCREENING: MRSA by PCR: NEGATIVE

## 2019-11-10 LAB — TYPE AND SCREEN
ABO/RH(D): B POS
Antibody Screen: NEGATIVE

## 2019-11-10 MED ORDER — CEFAZOLIN SODIUM-DEXTROSE 2-4 GM/100ML-% IV SOLN
2.0000 g | INTRAVENOUS | Status: AC
  Administered 2019-11-11: 2 g via INTRAVENOUS
  Filled 2019-11-10: qty 100

## 2019-11-10 MED ORDER — LORAZEPAM 2 MG/ML IJ SOLN
1.0000 mg | Freq: Once | INTRAMUSCULAR | Status: AC
  Administered 2019-11-10: 1 mg via INTRAVENOUS
  Filled 2019-11-10: qty 1

## 2019-11-10 MED ORDER — GADOBUTROL 1 MMOL/ML IV SOLN
7.0000 mL | Freq: Once | INTRAVENOUS | Status: AC | PRN
  Administered 2019-11-10: 7 mL via INTRAVENOUS

## 2019-11-10 MED ORDER — ACETAMINOPHEN 325 MG PO TABS
650.0000 mg | ORAL_TABLET | Freq: Once | ORAL | Status: AC
  Administered 2019-11-10: 650 mg via ORAL
  Filled 2019-11-10: qty 2

## 2019-11-10 MED ORDER — CHLORHEXIDINE GLUCONATE CLOTH 2 % EX PADS
6.0000 | MEDICATED_PAD | Freq: Once | CUTANEOUS | Status: AC
  Administered 2019-11-10: 6 via TOPICAL

## 2019-11-10 NOTE — Anesthesia Preprocedure Evaluation (Addendum)
Anesthesia Evaluation  Patient identified by MRN, date of birth, ID band Patient awake    Reviewed: Allergy & Precautions, NPO status , Patient's Chart, lab work & pertinent test results  History of Anesthesia Complications Negative for: history of anesthetic complications  Airway Mallampati: II  TM Distance: >3 FB Neck ROM: Full    Dental  (+) Edentulous Upper, Dental Advisory Given   Pulmonary neg pulmonary ROS,    Pulmonary exam normal        Cardiovascular hypertension, Pt. on medications Normal cardiovascular exam  Study Conclusions 2015  - Left ventricle: The cavity size was normal. Wall thickness was  normal. The estimated ejection fraction was 65%. Wall motion was  normal; there were no regional wall motion abnormalities.  - Right ventricle: The cavity size was normal. Systolic function  was normal.  - Tricuspid valve: There was mild regurgitation.  - Pulmonary arteries: PA peak pressure: 43 mm Hg (S). (Slightly  lower than last study)      Neuro/Psych PSYCHIATRIC DISORDERS Depression CVA, No Residual Symptoms    GI/Hepatic Neg liver ROS, GERD  ,  Endo/Other  diabetes  Renal/GU Renal InsufficiencyRenal disease     Musculoskeletal negative musculoskeletal ROS (+)   Abdominal   Peds  Hematology negative hematology ROS (+)   Anesthesia Other Findings   Reproductive/Obstetrics                            Anesthesia Physical Anesthesia Plan  ASA: III  Anesthesia Plan: General   Post-op Pain Management:    Induction: Intravenous  PONV Risk Score and Plan: 4 or greater and Ondansetron, Dexamethasone, Diphenhydramine and Treatment may vary due to age or medical condition  Airway Management Planned: Oral ETT and Video Laryngoscope Planned  Additional Equipment:   Intra-op Plan:   Post-operative Plan: Extubation in OR  Informed Consent: I have reviewed the  patients History and Physical, chart, labs and discussed the procedure including the risks, benefits and alternatives for the proposed anesthesia with the patient or authorized representative who has indicated his/her understanding and acceptance.     Dental advisory given  Plan Discussed with: Anesthesiologist, CRNA and Surgeon  Anesthesia Plan Comments:         Anesthesia Quick Evaluation

## 2019-11-10 NOTE — ED Notes (Signed)
Patient transported to X-ray 

## 2019-11-10 NOTE — Evaluation (Addendum)
Physical Therapy Evaluation Patient Details Name: [name] MRN: [id number] DOB: [birth year] Today's Date: 11/10/2019   History of Present Illness  Pt is a 74 y/o female admitted secondary to fall, weakness, and numbness in bilateral hands. 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 PMH includes CVA, HTN, and DM.   Clinical Impression  Pt admitted secondary to problem above with deficits below. Presenting with numbness in bilateral hands, weakness in BUE and BLE, and increased pain in bilateral feet. Required mod A to stand at EOB this session. Unable to tolerate further mobility secondary to pain. Pt reports she has had a fall and has had difficulty ambulating. Feel pt would benefit from SNF level therapies. Will continue to follow acutely to maximize functional mobility independence and safety.     Follow Up Recommendations SNF    Equipment Recommendations  Other (comment) (TBD)    Recommendations for Other Services       Precautions / Restrictions Precautions Precautions: Fall Restrictions Weight Bearing Restrictions: No      Mobility  Bed Mobility Overal bed mobility: Needs Assistance Bed Mobility: Supine to Sit;Sit to Supine     Supine to sit: Mod assist Sit to supine: Min assist   General bed mobility comments: Mod A for LE assist and trunk assist to come to sitting. Min A for LE assist for return to sitting.   Transfers Overall transfer level: Needs assistance Equipment used: None Transfers: Sit to/from Stand Sit to Stand: Mod assist         General transfer comment: Mod A for steadying assist to stand. Pt reporting increased pain upon standing, and unable to tolerate taking steps. Returned to sitting. PT stood in front of pt and had pt hold to PT arms.   Ambulation/Gait                Stairs            Wheelchair Mobility    Modified Rankin (Stroke Patients Only)       Balance  Overall balance assessment: Needs assistance Sitting-balance support: No upper extremity supported;Feet supported Sitting balance-Leahy Scale: Fair     Standing balance support: Bilateral upper extremity supported;During functional activity Standing balance-Leahy Scale: Poor Standing balance comment: Reliant on BUE support                              Pertinent Vitals/Pain Pain Assessment: Faces Faces Pain Scale: Hurts little more Pain Location: bilateral hands.  Pain Descriptors / Indicators: Aching;Tingling Pain Intervention(s): Limited activity within patient's tolerance;Monitored during session;Repositioned    Home Living Family/patient expects to be discharged to:: Private residence Living Arrangements: Alone Available Help at Discharge: Family;Available PRN/intermittently Type of Home: House Home Access: Stairs to enter   [employer] of Steps: 1 (threshold) Home Layout: One level Home Equipment: Shower seat      Prior Function Level of Independence: Independent         Comments: Had fallen; moving very slow      Hand Dominance        Extremity/Trunk Assessment   Upper Extremity Assessment Upper Extremity Assessment: RUE deficits/detail;LUE deficits/detail RUE Deficits / Details: 'reports tingling and numbness in hands. Decreased grip strength noted bilaterally RUE Sensation: decreased light touch LUE Deficits / Details: 'reports tingling and numbness in hands. Decreased grip strength noted bilaterally LUE Sensation: decreased light touch    Lower  Extremity Assessment Lower Extremity Assessment: Generalized weakness;RLE deficits/detail;LLE deficits/detail RLE Deficits / Details: Reports pain in bilateral feet LLE Deficits / Details: Reports pain in bilateral feet.     Cervical / Trunk Assessment Cervical / Trunk Assessment: Kyphotic  Communication   Communication: No difficulties  Cognition Arousal/Alertness:  Awake/alert Behavior During Therapy: WFL for tasks assessed/performed Overall Cognitive Status: No family/caregiver present to determine baseline cognitive functioning                                        General Comments      Exercises     Assessment/Plan    PT Assessment Patient needs continued PT services  PT Problem List Decreased strength;Decreased balance;Decreased mobility;Impaired sensation;Decreased knowledge of precautions;Decreased activity tolerance       PT Treatment Interventions Gait training;DME instruction;Functional mobility training;Therapeutic activities;Therapeutic exercise;Balance training;Patient/family education    PT Goals (Current goals can be found in the Care Plan section)  Acute Rehab PT Goals Patient Stated Goal: to decrease pain  PT Goal Formulation: With patient Time For Goal Achievement: 11/24/19 Potential to Achieve Goals: Good    Frequency Min 2X/week   Barriers to discharge Decreased caregiver support      Co-evaluation               AM-PAC PT "6 Clicks" Mobility  Outcome Measure Help needed turning from your back to your side while in a flat bed without using bedrails?: A Little Help needed moving from lying on your back to sitting on the side of a flat bed without using bedrails?: A Lot Help needed moving to and from a bed to a chair (including a wheelchair)?: A Lot Help needed standing up from a chair using your arms (e.g., wheelchair or bedside chair)?: A Lot Help needed to walk in hospital room?: A Lot Help needed climbing 3-5 steps with a railing? : A Lot 6 Click Score: 13    End of Session Equipment Utilized During Treatment: Gait belt Activity Tolerance: Patient limited by pain Patient left: in bed;with call bell/phone within reach (on stretcher in ED ) Nurse Communication: Mobility status PT Visit Diagnosis: Unsteadiness on feet (R26.81);Muscle weakness (generalized) (M62.81);History of falling  (Z91.81)    Time: 0630-1601 PT Time Calculation (min) (ACUTE ONLY): 10 min   Charges:   PT Evaluation $PT Eval Moderate Complexity: 1 Mod          Farley Ly, PT, DPT  Acute Rehabilitation Services  Pager: [phone] Office: [phone]   Lehman Prom 11/10/2019, 12:17 PM

## 2019-11-10 NOTE — ED Provider Notes (Addendum)
  Physical Exam  BP (!) 165/92   Pulse 78   Temp 98.4 F (36.9 C) (Oral)   Resp 20   Ht 1.626 m (5\' 4" )   Wt 70 kg   SpO2 98%   BMI 26.49 kg/m   Physical Exam  ED Course/Procedures     Procedures  MDM  Patient with multiple recent presentations for hand tingling.  She describes this as neck pain rating down her shoulders and paresthesias in her hands.  She has been seen multiple times and had a CT performed that question multiple lytic lesions.  She has been referred for outpatient and has not followed up but has returned to the ED.  Today MRI was ordered of her neck.  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  Also left maxillary sinusitis with opacification is noted. I discussed this finding with with Dr. , radiologist who read the scan.  He did not see lytic lesions on the MRI and feels that this was probably secondary to osteopenia. Discussed with Mr. Grace Isaac, NP on for neurosurgery- requests plain films with flex/extension and will see in consult  Discussed plan with patient  Dispo as per neurosurgery reccomendations    Julien Girt, MD 11/10/19 1204    11/12/19, MD 11/17/19 2352

## 2019-11-10 NOTE — ED Notes (Signed)
Pt admitted to [id number]; report called to [facility], RN.

## 2019-11-10 NOTE — ED Notes (Signed)
Pt received into room 37 and pt placed on cardiac monitor

## 2019-11-10 NOTE — ED Notes (Signed)
Patient care assumed at [facility] by this RN. Patient on her way to MRI for total spine WWO contrast. Will assess when she returns to ED.

## 2019-11-10 NOTE — ED Provider Notes (Addendum)
[name] EMERGENCY DEPARTMENT Provider Note   CSN: [id number] Arrival date & time: 11/09/19  2347     History Chief Complaint  Patient presents with  . Chest Pain    [name] is a 74 y.o. female.  HPI    74 year old female comes in a chief complaint of chest pain. She has hx of DM, stroke.  Pt reports having numbness in her hands and neck pain. She is also having L side leg numbness and some weakness with walking.  Patient states that her symptoms started after she had a fall at [facility] several weeks back.  She has been coming to the ER 3 or 4 times a month with similar complaints.  Patient reports that she has not followed up with PCP or a neurosurgeon.  In fact she does not know if she has been given a referral at all.  She lives by herself.  Pt has no associated urinary incontinence, urinary retention, bowel incontinence, pins and needle sensation in the perineal area.    Past Medical History:  Diagnosis Date  . Diabetes mellitus without complication (HCC)   . GERD 09/04/2006   Qualifier: Diagnosis of  By: Duke Salvia    . History of echocardiogram    a. 2D ECHO: 11/06/2013 EF 65%; no WMA. Mild TR. PA pk pressure 43 mm HG  . Hypertension   . Stroke [facility])     Patient Active Problem List   Diagnosis Date Noted  . Acute respiratory disease due to COVID-19 virus 05/23/2018  . NSTEMI (non-ST elevated myocardial infarction) (HCC) 11/05/2013  . Spinal stenosis of cervical region 10/20/2012  . Prolonged QT interval 10/18/2012  . Left sided numbness 10/18/2012  . Palpitations 10/18/2012  . 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 Numbness 06/20/2012  . Diabetes mellitus (HCC) 06/20/2012  . CVA (cerebral infarction) 06/20/2012  . Hypokalemia 06/20/2012  . ARTHRALGIA 01/28/2009  . CHEST PAIN UNSPECIFIED 11/26/2008  . DEPRESSION 09/04/2006  . Essential hypertension 09/04/2006  . GERD 09/04/2006  . MULTIPLE SITES, ARTHRITIS-ACUTE/CHRONIC  09/04/2006  . LOW BACK PAIN, CHRONIC 09/04/2006    Past Surgical History:  Procedure Laterality Date  . ABDOMINAL HYSTERECTOMY    . LEFT HEART CATHETERIZATION WITH CORONARY ANGIOGRAM N/A 11/05/2013   Procedure: LEFT HEART CATHETERIZATION WITH CORONARY ANGIOGRAM;  Surgeon: Lennette Bihari, MD;  Location: [facility] CATH LAB;  Service: Cardiovascular;  Laterality: N/A;     OB History   No obstetric history on file.     No family history on file.  Social History   Tobacco Use  . Smoking status: Never Smoker  . Smokeless tobacco: Never Used  Substance Use Topics  . Alcohol use: No  . Drug use: No    Home Medications Prior to Admission medications   Medication Sig Start Date End Date Taking? Authorizing Provider  albuterol (VENTOLIN HFA) 108 (90 Base) MCG/ACT inhaler Inhale 1-2 puffs into the lungs every 6 (six) hours as needed for wheezing or shortness of breath. 10/20/19   Wallis Bamberg, PA-C  amLODipine (NORVASC) 10 MG tablet Take 10 mg by mouth daily.    [provider]  chlorthalidone (HYGROTON) 25 MG tablet Take 1 tablet (25 mg total) by mouth daily. 10/20/19   Wallis Bamberg, PA-C  cloNIDine (CATAPRES) 0.2 MG tablet Take 1 tablet (0.2 mg total) by mouth 2 (two) times daily. 10/20/19   Wallis Bamberg, PA-C  clopidogrel (PLAVIX) 75 MG tablet Take 75 mg by mouth daily. 03/16/19   [provider]  diclofenac Sodium (VOLTAREN) 1 % GEL Apply 4 g topically 4 (four) times daily. 09/30/19   Palumbo, April, MD  doxycycline (VIBRAMYCIN) 100 MG capsule Take 1 capsule (100 mg total) by mouth 2 (two) times daily. 04/11/19   [name], PA-C  gabapentin (NEURONTIN) 100 MG capsule Take 1 capsule (100 mg total) by mouth 3 (three) times daily. 11/01/19 12/31/19  Sabas Sous, MD  glucose blood test strip Use as instructed 04/11/19   Army Melia A, PA-C  hydrALAZINE (APRESOLINE) 25 MG tablet Take 1 tablet (25 mg total) by mouth 3 (three) times daily. 02/10/18   Arby Barrette, MD    hydrALAZINE (APRESOLINE) 50 MG tablet Take 50 mg by mouth 2 (two) times daily. 03/22/19   [provider]  lidocaine (LIDODERM) 5 % PLEASE SEE ATTACHED FOR DETAILED DIRECTIONS 03/16/19   [provider]  lidocaine (LIDODERM) 5 % Place 1 patch onto the skin daily. Remove & Discard patch within 12 hours or as directed by MD 09/30/19   Nicanor Alcon, April, MD  losartan (COZAAR) 50 MG tablet Take 1 tablet (50 mg total) by mouth daily. 10/20/19   Wallis Bamberg, PA-C  metFORMIN (GLUCOPHAGE) 500 MG tablet Take 1.5 tablets (750 mg total) by mouth 2 (two) times daily with a meal. 10/27/19 11/26/19  Geoffery Lyons, MD  methocarbamol (ROBAXIN) 750 MG tablet Take 1 tablet (750 mg total) by mouth 3 (three) times daily as needed (muscle spasm/pain). 10/13/19   Cathren Laine, MD  MYRBETRIQ 25 MG TB24 tablet Take 25 mg by mouth daily. 02/03/18   [provider]  potassium chloride (MICRO-K) 10 MEQ CR capsule Take 10 mEq by mouth daily. 03/16/19   [provider]  predniSONE (DELTASONE) 10 MG tablet Take 3 tablets (30 mg total) by mouth daily with breakfast. 10/20/19   Wallis Bamberg, PA-C  rosuvastatin (CRESTOR) 10 MG tablet Take 10 mg by mouth at bedtime. 03/22/19   [provider]    Allergies    Codeine, Ibuprofen, and Imdur [isosorbide nitrate]  Review of Systems   Review of Systems  Constitutional: Positive for activity change.  Cardiovascular: Negative for chest pain.  Musculoskeletal: Positive for arthralgias.  Neurological: Positive for weakness and numbness.  All other systems reviewed and are negative.   Physical Exam Updated Vital Signs BP (!) 166/61 (BP Location: Right Arm)   Pulse 71   Temp 98.4 F (36.9 C) (Oral)   Resp (!) 22   Ht 5\' 4"  (1.626 m)   Wt 70 kg   SpO2 98%   BMI 26.49 kg/m   Physical Exam Vitals and nursing note reviewed.  Constitutional:      Appearance: She is well-developed.  HENT:     Head: Normocephalic and atraumatic.   Cardiovascular:     Rate and Rhythm: Normal rate.  Pulmonary:     Effort: Pulmonary effort is normal.  Abdominal:     General: Bowel sounds are normal.  Musculoskeletal:     Cervical back: Normal range of motion and neck supple.  Skin:    General: Skin is warm and dry.  Neurological:     Mental Status: She is alert and oriented to person, place, and time.     Comments: Upper extremity and lower extremity strength is 4+ out of 5 Patient has subjective numbness over the forearm and hands. Trace patellar reflex bilaterally     ED Results / Procedures / Treatments   Labs (all labs ordered are listed, but  only abnormal results are displayed) Labs Reviewed  BASIC METABOLIC PANEL - Abnormal; Notable for the following components:      Result Value   Sodium 134 (*)    Chloride 94 (*)    Glucose, Bld 173 (*)    Creatinine, Ser 1.53 (*)    Calcium 8.8 (*)    GFR, Estimated 34 (*)    All other components within normal limits  CBC  PROTIME-INR  TROPONIN I (HIGH SENSITIVITY)  TROPONIN I (HIGH SENSITIVITY)    EKG EKG Interpretation  Date/Time:  Tuesday November 10 2019 00:00:04 EDT Ventricular Rate:  86 PR Interval:  126 QRS Duration: 64 QT Interval:  360 QTC Calculation: 430 R Axis:   63 Text Interpretation: Normal sinus rhythm Normal ECG No acute changes No significant change since last tracing Confirmed by Derwood Kaplan [phone]) on 11/10/2019 2:58:00 AM   Radiology DG Chest 2 View  Result Date: 11/10/2019 CLINICAL DATA:  Central chest pain with mild shortness of breath. EXAM: CHEST - 2 VIEW COMPARISON:  10/31/2019 FINDINGS: Mild hyperinflation. Normal heart size and pulmonary vascularity. No focal airspace disease or consolidation in the lungs. No blunting of costophrenic angles. No pneumothorax. Mediastinal contours appear intact. Degenerative changes in the spine and shoulders. IMPRESSION: No active cardiopulmonary disease. Electronically Signed   By: Burman Nieves  M.D.   On: 11/10/2019 00:24    Procedures Procedures (including critical care time)  Medications Ordered in ED Medications  acetaminophen (TYLENOL) tablet 650 mg (650 mg Oral Given 11/10/19 0211)  LORazepam (ATIVAN) injection 1 mg (1 mg Intravenous Given 11/10/19 3220)    ED Course  I have reviewed the triage vital signs and the nursing notes.  Pertinent labs & imaging results that were available during my care of the patient were reviewed by me and considered in my medical decision making (see chart for details).    MDM Rules/Calculators/A&P                          74 year old comes in a chief complaint of neck pain, paresthesias in bilateral upper extremity and some weakness in her legs.  Patient has been coming with this complaint to the ER over the last 2 months.  She has not followed up with the PCP or neurosurgeon as requested.  I reviewed patient's CT cervical spine from September.  She had some degenerative spine disease and multiple lytic lesion.  Patient has not demonstrated capacity to follow-up appropriately to get further work-up.  I think we will get an MRI at this time.  Given that there is concerns for malignancy, we will get MRI cervical and thoracic spine.  Patient does not have any cord compression-like symptoms, and we would not have proceeded with MRI if it were not for the fact that patient has failed to do so thus far, and she is at risk for worsening morbidity in case there is underlying cancer.  Additionally, physical therapy and social work consult has been placed.  PT to assess for safety at home, and social work to assist with placement if needed or ensuring that patient is provided safe living conditions at home.  Her care will be signed out to incoming team.  Final Clinical Impression(s) / ED Diagnoses Final diagnoses:  Paresthesias    Rx / DC Orders ED Discharge Orders    None       Derwood Kaplan, MD 11/10/19 0630    Derwood Kaplan,  MD 11/10/19  0631  

## 2019-11-10 NOTE — ED Notes (Signed)
Pt assisted to bedside commode

## 2019-11-10 NOTE — ED Notes (Signed)
Pt. Ambulated to the bathroom with this tech's assistance. Steady gait and no complaints of dizziness shob. Had complaints of leg pain while ambulating.

## 2019-11-10 NOTE — Consult Note (Signed)
Reason for Consult:Weakness with ambulation, bilateral hand numbness Referring Physician: Dr. Rosalia Hammersay  HPI: [name] is a 74 y.o. female who presents to the ED for c/o neck pain and numbness and pain in her bilateral hands and LUE. She reported that her symptoms began about one month ago after suffering a fall at [facility]. She presented for evaluation multiple times related to her bilateral hand numbness and was instructed to follow up as an outpatient. She has not followed up as an outpatient and presented to the ED today where a cervical MRI was performed. Her MRI was most remarkable for critical spinal stenosis with spinal cord compression at the C3/4 level and subsequently led to neurosurgical consult.   Past Medical History:  Diagnosis Date  . Diabetes mellitus without complication (HCC)   . GERD 09/04/2006   Qualifier: Diagnosis of  By: Duke SalviaGore, Denise    . History of echocardiogram    a. 2D ECHO: 11/06/2013 EF 65%; no WMA. Mild TR. PA pk pressure 43 mm HG  . Hypertension   . Stroke [facility](HCC)     Past Surgical History:  Procedure Laterality Date  . ABDOMINAL HYSTERECTOMY    . LEFT HEART CATHETERIZATION WITH CORONARY ANGIOGRAM N/A 11/05/2013   Procedure: LEFT HEART CATHETERIZATION WITH CORONARY ANGIOGRAM;  Surgeon: Lennette Biharihomas A Kelly, MD;  [facility];  Service: Cardiovascular;  Laterality: N/A;    No family history on file.  Social History:  reports that she has never smoked. She has never used smokeless tobacco. She reports that she does not drink alcohol and does not use drugs.  Allergies:  Allergies  Allergen Reactions  . Codeine Other (See Comments)    REACTION: Hallucinations  . Ibuprofen Other (See Comments)    REACTION: Elevated Blood Pressure  . Imdur [Isosorbide Nitrate] Other (See Comments)    Headache & upset stomach    Medications: I have reviewed the patient's current medications.  Results for orders placed or performed during the hospital encounter of 11/09/19  (from the past 48 hour(s))  Basic metabolic panel     Status: Abnormal   Collection Time: 11/09/19 11:55 PM  Result Value Ref Range   Sodium 134 (L) 135 - 145 mmol/L   Potassium 3.5 3.5 - 5.1 mmol/L   Chloride 94 (L) 98 - 111 mmol/L   CO2 28 22 - 32 mmol/L   Glucose, Bld 173 (H) 70 - 99 mg/dL    Comment: Glucose reference range applies only to samples taken after fasting for at least 8 hours.   BUN 21 8 - 23 mg/dL   Creatinine, Ser 4.091.53 (H) 0.44 - 1.00 mg/dL   Calcium 8.8 (L) 8.9 - 10.3 mg/dL   GFR, Estimated 34 (L) >60 mL/min   Anion gap 12 5 - 15    Comment: Performed at [facility], [address] [place], [place] [id number]  CBC     Status: None   Collection Time: 11/09/19 11:55 PM  Result Value Ref Range   WBC 10.1 4.0 - 10.5 K/uL   RBC 4.07 3.87 - 5.11 MIL/uL   Hemoglobin 12.3 12.0 - 15.0 g/dL   HCT 47.837.9 36 - 46 %   MCV 93.1 80.0 - 100.0 fL   MCH 30.2 26.0 - 34.0 pg   MCHC 32.5 30.0 - 36.0 g/dL   RDW 29.512.3 62.111.5 - 30.815.5 %   Platelets 218 150 - 400 K/uL   nRBC 0.0 0.0 - 0.2 %    Comment: Performed at  [facility], [address] [place], [place] [address]  Troponin I (High Sensitivity)     Status: None   Collection Time: 11/09/19 11:55 PM  Result Value Ref Range   Troponin I (High Sensitivity) 11 <18 ng/L    Comment: (NOTE) Elevated high sensitivity troponin I (hsTnI) values and significant  changes across serial measurements may suggest ACS but many other  chronic and acute conditions are known to elevate hsTnI results.  Refer to the "Links" section for chest pain algorithms and additional  guidance. Performed at [facility], [address], [place], [place] [address]   Protime-INR (order if Patient is taking Coumadin / Warfarin)     Status: None   Collection Time: 11/09/19 11:55 PM  Result Value Ref Range   Prothrombin Time 11.5 11.4 - 15.2 seconds   INR 0.9 0.8 - 1.2    Comment: (NOTE) INR goal varies based on device and disease  states. Performed at [facility], [address] [place], [place] [address]   Troponin I (High Sensitivity)     Status: None   Collection Time: 11/10/19  2:14 AM  Result Value Ref Range   Troponin I (High Sensitivity) 9 <18 ng/L    Comment: (NOTE) Elevated high sensitivity troponin I (hsTnI) values and significant  changes across serial measurements may suggest ACS but many other  chronic and acute conditions are known to elevate hsTnI results.  Refer to the "Links" section for chest pain algorithms and additional  guidance. Performed at [facility], [address] [place], [place] [address]     DG Chest 2 View  Result Date: 11/10/2019 CLINICAL DATA:  Central chest pain with mild shortness of breath. EXAM: CHEST - 2 VIEW COMPARISON:  10/31/2019 FINDINGS: Mild hyperinflation. Normal heart size and pulmonary vascularity. No focal airspace disease or consolidation in the lungs. No blunting of costophrenic angles. No pneumothorax. Mediastinal contours appear intact. Degenerative changes in the spine and shoulders. IMPRESSION: No active cardiopulmonary disease. Electronically Signed   By: Burman Nieves M.D.   On: 11/10/2019 00:24   MR Cervical Spine W or Wo Contrast  Addendum Date: 11/10/2019   ADDENDUM REPORT: 11/10/2019 10:06 ADDENDUM: Omitted finding of active facet arthritis on the right at C3-4 where there is marrow edema and enhancement. Electronically Signed   By: Marnee Spring M.D.   On: 11/10/2019 10:06   Result Date: 11/10/2019 CLINICAL DATA:  Chronic neck pain.  Cervical spine degeneration EXAM: MRI CERVICAL SPINE WITHOUT AND WITH CONTRAST TECHNIQUE: Multiplanar and multiecho pulse sequences of the cervical spine, to include the craniocervical junction and cervicothoracic junction, were obtained without and with intravenous contrast. CONTRAST:  74mL GADAVIST GADOBUTROL 1 MMOL/ML IV SOLN COMPARISON:  Cervical spine CT 10/12/2019 FINDINGS: Alignment: Degenerative  reversal of cervical lordosis.  No listhesis Vertebrae: No fracture, evidence of discitis, or bone lesion. Cord: Flattening with mild T2 hyperintensity at C3-4. Less advanced flattening at C4-5. Posterior Fossa, vertebral arteries, paraspinal tissues: Left maxillary sinusitis with mucosal thickening and central inspissated material. Disc levels: C2-3: Unremarkable. C3-4: Disc narrowing and bulging with central protrusion. Ligamentum flavum thickening. Advanced spinal stenosis with cord compression to 3 mm in the midline. Uncovertebral spurring on both sides. Mild bilateral foraminal narrowing. C4-5: Disc narrowing and bulging with endplate and uncovertebral ridging asymmetric to the left. Ligamentum flavum thickening. There is spinal stenosis with cord flattening. Advanced left foraminal impingement. C5-6: Disc narrowing and bulging with endplate ridging eccentric to  the left. Uncovertebral spurring asymmetric to the left. Spinal stenosis with mild left cord flattening. Left foraminal impingement. C6-7: Disc narrowing and bulging with uncovertebral ridging. Biforaminal stenosis, moderate appearing on the right and more advanced appearing on the left. Mild spinal stenosis without cord flattening C7-T1:Unremarkable. IMPRESSION: 1. Degenerative spinal stenosis from C3-4 to C6-7, particularly severe at C3-4 where there is cord compression and cord signal abnormality. 2. Foraminal impingement on the left at C4-5 to C6-7. 3. Left maxillary sinusitis with complete opacification, new from a 2020 head CT. Electronically Signed: By: Marnee Spring M.D. On: 11/10/2019 10:01   MR THORACIC SPINE W WO CONTRAST  Result Date: 11/10/2019 CLINICAL DATA:  Numbness in hands with neck pain. Fall several weeks ago at [facility]. EXAM: MRI THORACIC WITHOUT AND WITH CONTRAST TECHNIQUE: Multiplanar and multiecho pulse sequences of the thoracic spine were obtained without and with intravenous contrast. CONTRAST:  20mL GADAVIST GADOBUTROL 1  MMOL/ML IV SOLN COMPARISON:  None. FINDINGS: MRI THORACIC SPINE FINDINGS Alignment:  Physiologic Vertebrae: No fracture, evidence of discitis, or bone lesion. Cord:  Normal signal and morphology. Paraspinal and other soft tissues: No paraspinal inflammation or mass seen. Trace right pleural effusion. Disc levels: Spondylitic spurring and diffuse disc desiccation in keeping with age. Small disc protrusions seen at T7-8 and T8-9. Mild generalized facet spurring. No high-grade stenosis. IMPRESSION: Ordinary degenerative changes without impingement or visible inflammation. Trace right pleural effusion. Electronically Signed   By: Marnee Spring M.D.   On: 11/10/2019 10:04    Review of Systems Blood pressure (!) 165/92, pulse 78, temperature 98.4 F (36.9 C), temperature source Oral, resp. rate 20, height 5\' 4"  (1.626 m), weight 70 kg, SpO2 98 %. Physical Exam Constitutional:      General: She is not in acute distress.    Appearance: She is well-developed and normal weight.  HENT:     Head: Normocephalic and atraumatic.  Eyes:     Extraocular Movements: Extraocular movements intact.     Pupils: Pupils are equal, round, and reactive to light.  Musculoskeletal:     Cervical back: Normal range of motion and neck supple.  Neurological:     General: No focal deficit present.     Mental Status: She is alert and oriented to person, place, and time.     GCS: GCS eye subscore is 4. GCS verbal subscore is 5. GCS motor subscore is 6.     Cranial Nerves: No cranial nerve deficit.     Sensory: Sensation is intact.     Motor: Weakness present.     Deep Tendon Reflexes: Reflexes abnormal.  Psychiatric:        Mood and Affect: Mood normal.        Behavior: Behavior normal.        Thought Content: Thought content normal.        Judgment: Judgment normal.   She is hyperreflexive and has a positive Hoffman's sign bilaterally. Positive Spurling's sign on the left.   Left hand intrinsics 4/5 Right hand  intrinsics 4/5 Left deltoid 4+/5 Left biceps 4/5 Left wrist flexion 4/5    Assessment/Plan: 74. y.o. female who presents with bilateral hand numbness, LUE numbness/pain, and neck pain. 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 Her imaging reveled severe multilevel degenerated disks with critical spinal stenosis measuring 4 mm with cord compression and cord signal abnormality at the C3/4 level. There is also severe spinal stenosis at the C4/5, C5/6, and C6/7 levels. Severe foraminal  stenosis on the left at C4/5, C5/6, and C6/7.   Since the patient has critical spinal stenosis and is myelopathic and has such significant weakness, I have recommended the patient undergo an urgent decompression and fusion to decompress her spinal cord and exiting nerve roots. This will consist of a C/3/4, C4/5, C5/6, and C6/7 anterior cervical decompression and fusion. Risks and benefits were discussed with the patient and she wishes to proceed with surgery. Detailed patient education was given to patient and all her questions were answered. Her surgery is scheduled for Wednesday, October 20th. Place and maintain hard cervical collar.  Council Mechanic, DNP, NP-C 11/10/2019, 12:04 PM

## 2019-11-10 NOTE — ED Provider Notes (Signed)
  Provider Note MRN:  [id number]  Arrival date & time: 11/10/19    ED Course and Medical Decision Making  Assumed care from Dr. Rosalia Hammers at shift change.  Evaluated by neurosurgery, who will admit and she will undergo operative intervention tomorrow.  Procedures  Final Clinical Impressions(s) / ED Diagnoses     ICD-10-CM   1. Paresthesias  R20.2   2. 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 Pilar Plate, MD [facility] [email or web address]    Sabas Sous, MD 11/10/19 [phone]

## 2019-11-10 NOTE — Discharge Planning (Signed)
RNCM following for disposition needs. °Boleslaus Holloway J. Cuong Moorman, RN, BSN, NCM [phone] ° °

## 2019-11-11 ENCOUNTER — Encounter (HOSPITAL_COMMUNITY)
Admission: EM | Disposition: A | Discharge status: Home / Self Care | Payer: Self-pay | Source: Home / Self Care | Attending: Neurosurgery

## 2019-11-11 ENCOUNTER — Inpatient Hospital Stay (HOSPITAL_COMMUNITY): Payer: Medicare HMO

## 2019-11-11 ENCOUNTER — Inpatient Hospital Stay (HOSPITAL_COMMUNITY): Payer: Medicare HMO | Admitting: Anesthesiology

## 2019-11-11 ENCOUNTER — Encounter (HOSPITAL_COMMUNITY): Payer: Self-pay | Admitting: Neurosurgery

## 2019-11-11 HISTORY — PX: ANTERIOR CERVICAL DECOMPRESSION/DISCECTOMY FUSION 4 LEVELS: SHX5556

## 2019-11-11 LAB — GLUCOSE, CAPILLARY
Glucose-Capillary: 241 mg/dL — ABNORMAL HIGH (ref 70–99)
Glucose-Capillary: 477 mg/dL — ABNORMAL HIGH (ref 70–99)

## 2019-11-11 SURGERY — ANTERIOR CERVICAL DECOMPRESSION/DISCECTOMY FUSION 4 LEVELS
Anesthesia: General | Site: Spine Cervical

## 2019-11-11 MED ORDER — PROPOFOL 10 MG/ML IV BOLUS
INTRAVENOUS | Status: DC | PRN
  Administered 2019-11-11: 160 mg via INTRAVENOUS

## 2019-11-11 MED ORDER — PHENOL 1.4 % MT LIQD
1.0000 | OROMUCOSAL | Status: DC | PRN
  Administered 2019-11-11 – 2019-11-12 (×4): 1 via OROMUCOSAL
  Filled 2019-11-11: qty 177

## 2019-11-11 MED ORDER — INSULIN ASPART 100 UNIT/ML ~~LOC~~ SOLN
0.0000 [IU] | Freq: Three times a day (TID) | SUBCUTANEOUS | Status: DC
  Administered 2019-11-12: 3 [IU] via SUBCUTANEOUS
  Administered 2019-11-12: 5 [IU] via SUBCUTANEOUS
  Administered 2019-11-12: 3 [IU] via SUBCUTANEOUS
  Administered 2019-11-13: 2 [IU] via SUBCUTANEOUS
  Administered 2019-11-13: 3 [IU] via SUBCUTANEOUS
  Administered 2019-11-13: 2 [IU] via SUBCUTANEOUS
  Administered 2019-11-14 (×2): 3 [IU] via SUBCUTANEOUS
  Administered 2019-11-14 – 2019-11-15 (×2): 2 [IU] via SUBCUTANEOUS
  Administered 2019-11-15: 5 [IU] via SUBCUTANEOUS
  Administered 2019-11-16 (×2): 2 [IU] via SUBCUTANEOUS
  Administered 2019-11-16: 3 [IU] via SUBCUTANEOUS
  Administered 2019-11-17: 2 [IU] via SUBCUTANEOUS
  Administered 2019-11-17 (×2): 3 [IU] via SUBCUTANEOUS
  Administered 2019-11-18: 2 [IU] via SUBCUTANEOUS
  Administered 2019-11-18: 3 [IU] via SUBCUTANEOUS
  Administered 2019-11-18 – 2019-11-19 (×2): 2 [IU] via SUBCUTANEOUS
  Administered 2019-11-19: 5 [IU] via SUBCUTANEOUS
  Administered 2019-11-19: 3 [IU] via SUBCUTANEOUS
  Administered 2019-11-20 – 2019-11-21 (×5): 2 [IU] via SUBCUTANEOUS
  Administered 2019-11-22 – 2019-11-23 (×4): 1 [IU] via SUBCUTANEOUS
  Administered 2019-11-23: 2 [IU] via SUBCUTANEOUS
  Administered 2019-11-23: 3 [IU] via SUBCUTANEOUS
  Administered 2019-11-24: 2 [IU] via SUBCUTANEOUS
  Administered 2019-11-25: 1 [IU] via SUBCUTANEOUS
  Administered 2019-11-25: 2 [IU] via SUBCUTANEOUS
  Administered 2019-11-25: 3 [IU] via SUBCUTANEOUS
  Administered 2019-11-26: 1 [IU] via SUBCUTANEOUS
  Administered 2019-11-26: 3 [IU] via SUBCUTANEOUS

## 2019-11-11 MED ORDER — FLEET ENEMA 7-19 GM/118ML RE ENEM: 1.0000 | ENEMA | Freq: Once | RECTAL | Status: DC | PRN

## 2019-11-11 MED ORDER — FENTANYL CITRATE (PF) 250 MCG/5ML IJ SOLN
INTRAMUSCULAR | Status: DC | PRN
  Administered 2019-11-11: 150 ug via INTRAVENOUS

## 2019-11-11 MED ORDER — ACETAMINOPHEN 500 MG PO TABS
1000.0000 mg | ORAL_TABLET | Freq: Once | ORAL | Status: AC
  Administered 2019-11-11: 1000 mg via ORAL
  Filled 2019-11-11: qty 2

## 2019-11-11 MED ORDER — MIDAZOLAM HCL 5 MG/5ML IJ SOLN
INTRAMUSCULAR | Status: DC | PRN
  Administered 2019-11-11: 2 mg via INTRAVENOUS

## 2019-11-11 MED ORDER — CLONIDINE HCL 0.2 MG PO TABS
0.2000 mg | ORAL_TABLET | Freq: Two times a day (BID) | ORAL | Status: DC
  Administered 2019-11-11 – 2019-11-26 (×30): 0.2 mg via ORAL
  Filled 2019-11-11: qty 2
  Filled 2019-11-11: qty 1
  Filled 2019-11-11 (×3): qty 2
  Filled 2019-11-11 (×2): qty 1
  Filled 2019-11-11: qty 2
  Filled 2019-11-11 (×4): qty 1
  Filled 2019-11-11: qty 2
  Filled 2019-11-11: qty 1
  Filled 2019-11-11 (×2): qty 2
  Filled 2019-11-11 (×2): qty 1
  Filled 2019-11-11 (×2): qty 2
  Filled 2019-11-11: qty 1
  Filled 2019-11-11 (×2): qty 2
  Filled 2019-11-11: qty 1
  Filled 2019-11-11: qty 2
  Filled 2019-11-11: qty 1
  Filled 2019-11-11: qty 2
  Filled 2019-11-11 (×2): qty 1
  Filled 2019-11-11 (×5): qty 2
  Filled 2019-11-11 (×7): qty 1
  Filled 2019-11-11: qty 2
  Filled 2019-11-11 (×4): qty 1
  Filled 2019-11-11 (×2): qty 2
  Filled 2019-11-11 (×5): qty 1
  Filled 2019-11-11 (×3): qty 2

## 2019-11-11 MED ORDER — ACETAMINOPHEN 650 MG RE SUPP: 650.0000 mg | RECTAL | Status: DC | PRN

## 2019-11-11 MED ORDER — CEFAZOLIN SODIUM-DEXTROSE 2-4 GM/100ML-% IV SOLN
2.0000 g | Freq: Three times a day (TID) | INTRAVENOUS | Status: AC
  Administered 2019-11-11 – 2019-11-12 (×2): 2 g via INTRAVENOUS
  Filled 2019-11-11 (×2): qty 100

## 2019-11-11 MED ORDER — KCL IN DEXTROSE-NACL 20-5-0.45 MEQ/L-%-% IV SOLN
INTRAVENOUS | Status: DC
  Filled 2019-11-11 (×2): qty 1000

## 2019-11-11 MED ORDER — FENTANYL CITRATE (PF) 250 MCG/5ML IJ SOLN
INTRAMUSCULAR | Status: AC
  Filled 2019-11-11: qty 5

## 2019-11-11 MED ORDER — LACTATED RINGERS IV SOLN: INTRAVENOUS | Status: DC

## 2019-11-11 MED ORDER — SODIUM CHLORIDE 0.9% FLUSH
3.0000 mL | Freq: Two times a day (BID) | INTRAVENOUS | Status: DC
  Administered 2019-11-11 – 2019-11-26 (×29): 3 mL via INTRAVENOUS

## 2019-11-11 MED ORDER — POTASSIUM CHLORIDE CRYS ER 10 MEQ PO TBCR
10.0000 meq | EXTENDED_RELEASE_TABLET | Freq: Every day | ORAL | Status: DC
  Administered 2019-11-11 – 2019-11-26 (×16): 10 meq via ORAL
  Filled 2019-11-11 (×16): qty 1

## 2019-11-11 MED ORDER — MIDAZOLAM HCL 2 MG/2ML IJ SOLN
INTRAMUSCULAR | Status: AC
  Filled 2019-11-11: qty 2

## 2019-11-11 MED ORDER — LOSARTAN POTASSIUM 50 MG PO TABS
50.0000 mg | ORAL_TABLET | Freq: Every day | ORAL | Status: DC
  Administered 2019-11-11 – 2019-11-26 (×16): 50 mg via ORAL
  Filled 2019-11-11 (×17): qty 1

## 2019-11-11 MED ORDER — MENTHOL 3 MG MT LOZG
1.0000 | LOZENGE | OROMUCOSAL | Status: DC | PRN
  Administered 2019-11-13 – 2019-11-16 (×4): 3 mg via ORAL
  Filled 2019-11-11 (×5): qty 9

## 2019-11-11 MED ORDER — THROMBIN 5000 UNITS EX SOLR
CUTANEOUS | Status: AC
  Filled 2019-11-11: qty 5000

## 2019-11-11 MED ORDER — CHLORTHALIDONE 25 MG PO TABS
25.0000 mg | ORAL_TABLET | Freq: Every day | ORAL | Status: DC
  Administered 2019-11-11 – 2019-11-26 (×16): 25 mg via ORAL
  Filled 2019-11-11 (×16): qty 1

## 2019-11-11 MED ORDER — CELECOXIB 200 MG PO CAPS
200.0000 mg | ORAL_CAPSULE | Freq: Once | ORAL | Status: AC
  Administered 2019-11-11: 200 mg via ORAL
  Filled 2019-11-11: qty 1

## 2019-11-11 MED ORDER — SODIUM CHLORIDE 0.9% FLUSH: 3.0000 mL | INTRAVENOUS | Status: DC | PRN

## 2019-11-11 MED ORDER — ESMOLOL HCL 100 MG/10ML IV SOLN
INTRAVENOUS | Status: DC | PRN
  Administered 2019-11-11 (×2): 20 mg via INTRAVENOUS
  Administered 2019-11-11 (×2): 30 mg via INTRAVENOUS

## 2019-11-11 MED ORDER — LIDOCAINE-EPINEPHRINE 1 %-1:100000 IJ SOLN
INTRAMUSCULAR | Status: AC
  Filled 2019-11-11: qty 1

## 2019-11-11 MED ORDER — LABETALOL HCL 5 MG/ML IV SOLN
INTRAVENOUS | Status: AC
  Filled 2019-11-11: qty 4

## 2019-11-11 MED ORDER — HYDRALAZINE HCL 50 MG PO TABS
50.0000 mg | ORAL_TABLET | Freq: Two times a day (BID) | ORAL | Status: DC
  Administered 2019-11-11 – 2019-11-26 (×30): 50 mg via ORAL
  Filled 2019-11-11 (×30): qty 1

## 2019-11-11 MED ORDER — 0.9 % SODIUM CHLORIDE (POUR BTL) OPTIME
TOPICAL | Status: DC | PRN
  Administered 2019-11-11: 1000 mL

## 2019-11-11 MED ORDER — THROMBIN 5000 UNITS EX SOLR
OROMUCOSAL | Status: DC | PRN
  Administered 2019-11-11: 5 mL via TOPICAL

## 2019-11-11 MED ORDER — METFORMIN HCL 500 MG PO TABS
750.0000 mg | ORAL_TABLET | Freq: Two times a day (BID) | ORAL | Status: DC
  Administered 2019-11-12 – 2019-11-26 (×30): 750 mg via ORAL
  Filled 2019-11-11 (×30): qty 2

## 2019-11-11 MED ORDER — SODIUM CHLORIDE 0.9 % IV SOLN
250.0000 mL | INTRAVENOUS | Status: DC
  Administered 2019-11-11: 250 mL via INTRAVENOUS

## 2019-11-11 MED ORDER — METHOCARBAMOL 750 MG PO TABS: 750.0000 mg | ORAL_TABLET | Freq: Three times a day (TID) | ORAL | Status: DC | PRN

## 2019-11-11 MED ORDER — HYDROCODONE-ACETAMINOPHEN 5-325 MG PO TABS
1.0000 | ORAL_TABLET | ORAL | Status: DC | PRN
  Administered 2019-11-13 – 2019-11-25 (×17): 1 via ORAL
  Filled 2019-11-11 (×17): qty 1

## 2019-11-11 MED ORDER — ONDANSETRON HCL 4 MG/2ML IJ SOLN
INTRAMUSCULAR | Status: DC | PRN
  Administered 2019-11-11: 4 mg via INTRAVENOUS

## 2019-11-11 MED ORDER — AMLODIPINE BESYLATE 10 MG PO TABS
10.0000 mg | ORAL_TABLET | Freq: Every day | ORAL | Status: DC
  Administered 2019-11-11 – 2019-11-26 (×16): 10 mg via ORAL
  Filled 2019-11-11 (×16): qty 1

## 2019-11-11 MED ORDER — CHLORHEXIDINE GLUCONATE 0.12 % MT SOLN
15.0000 mL | OROMUCOSAL | Status: AC
  Administered 2019-11-11: 15 mL via OROMUCOSAL
  Filled 2019-11-11: qty 15

## 2019-11-11 MED ORDER — HYDROMORPHONE HCL 1 MG/ML IJ SOLN
0.5000 mg | INTRAMUSCULAR | Status: DC | PRN
  Administered 2019-11-11 – 2019-11-12 (×2): 0.5 mg via INTRAVENOUS
  Filled 2019-11-11 (×2): qty 1

## 2019-11-11 MED ORDER — OXYCODONE HCL 5 MG PO TABS
10.0000 mg | ORAL_TABLET | ORAL | Status: DC | PRN
  Administered 2019-11-11 – 2019-11-26 (×48): 10 mg via ORAL
  Filled 2019-11-11 (×50): qty 2

## 2019-11-11 MED ORDER — METHOCARBAMOL 500 MG PO TABS
500.0000 mg | ORAL_TABLET | Freq: Four times a day (QID) | ORAL | Status: DC | PRN
  Administered 2019-11-13 – 2019-11-25 (×14): 500 mg via ORAL
  Filled 2019-11-11 (×15): qty 1

## 2019-11-11 MED ORDER — ZOLPIDEM TARTRATE 5 MG PO TABS
5.0000 mg | ORAL_TABLET | Freq: Every evening | ORAL | Status: DC | PRN
  Administered 2019-11-13 – 2019-11-25 (×8): 5 mg via ORAL
  Filled 2019-11-11 (×9): qty 1

## 2019-11-11 MED ORDER — DEXAMETHASONE SODIUM PHOSPHATE 10 MG/ML IJ SOLN
INTRAMUSCULAR | Status: DC | PRN
  Administered 2019-11-11: 10 mg via INTRAVENOUS

## 2019-11-11 MED ORDER — ACETAMINOPHEN 325 MG PO TABS
650.0000 mg | ORAL_TABLET | ORAL | Status: DC | PRN
  Administered 2019-11-16 – 2019-11-23 (×4): 650 mg via ORAL
  Filled 2019-11-11 (×4): qty 2

## 2019-11-11 MED ORDER — METHOCARBAMOL 1000 MG/10ML IJ SOLN
500.0000 mg | Freq: Four times a day (QID) | INTRAVENOUS | Status: DC | PRN
  Filled 2019-11-11: qty 5

## 2019-11-11 MED ORDER — POLYETHYLENE GLYCOL 3350 17 G PO PACK
17.0000 g | PACK | Freq: Every day | ORAL | Status: DC | PRN
  Administered 2019-11-17 – 2019-11-21 (×2): 17 g via ORAL
  Filled 2019-11-11 (×2): qty 1

## 2019-11-11 MED ORDER — DOCUSATE SODIUM 100 MG PO CAPS
100.0000 mg | ORAL_CAPSULE | Freq: Two times a day (BID) | ORAL | Status: DC
  Administered 2019-11-12 – 2019-11-26 (×26): 100 mg via ORAL
  Filled 2019-11-11 (×26): qty 1

## 2019-11-11 MED ORDER — FENTANYL CITRATE (PF) 100 MCG/2ML IJ SOLN: 25.0000 ug | INTRAMUSCULAR | Status: DC | PRN

## 2019-11-11 MED ORDER — ALUM & MAG HYDROXIDE-SIMETH 200-200-20 MG/5ML PO SUSP: 30.0000 mL | Freq: Four times a day (QID) | ORAL | Status: DC | PRN

## 2019-11-11 MED ORDER — BUPIVACAINE HCL (PF) 0.5 % IJ SOLN
INTRAMUSCULAR | Status: DC | PRN
  Administered 2019-11-11: 5 mL

## 2019-11-11 MED ORDER — LABETALOL HCL 5 MG/ML IV SOLN
INTRAVENOUS | Status: DC | PRN
  Administered 2019-11-11 (×2): 10 mg via INTRAVENOUS

## 2019-11-11 MED ORDER — MUPIROCIN 2 % EX OINT: 1.0000 "application " | TOPICAL_OINTMENT | Freq: Two times a day (BID) | CUTANEOUS | Status: DC

## 2019-11-11 MED ORDER — LACTATED RINGERS IV SOLN: INTRAVENOUS | Status: DC | PRN

## 2019-11-11 MED ORDER — ROCURONIUM BROMIDE 10 MG/ML (PF) SYRINGE
PREFILLED_SYRINGE | INTRAVENOUS | Status: DC | PRN
  Administered 2019-11-11: 60 mg via INTRAVENOUS
  Administered 2019-11-11: 20 mg via INTRAVENOUS

## 2019-11-11 MED ORDER — AMLODIPINE BESYLATE 10 MG PO TABS: 10.0000 mg | ORAL_TABLET | Freq: Every day | ORAL | Status: DC

## 2019-11-11 MED ORDER — HYDRALAZINE HCL 20 MG/ML IJ SOLN
INTRAMUSCULAR | Status: AC
  Filled 2019-11-11: qty 1

## 2019-11-11 MED ORDER — PROPOFOL 10 MG/ML IV BOLUS
INTRAVENOUS | Status: AC
  Filled 2019-11-11: qty 20

## 2019-11-11 MED ORDER — GABAPENTIN 100 MG PO CAPS
100.0000 mg | ORAL_CAPSULE | Freq: Three times a day (TID) | ORAL | Status: DC
  Administered 2019-11-11 – 2019-11-26 (×45): 100 mg via ORAL
  Filled 2019-11-11 (×45): qty 1

## 2019-11-11 MED ORDER — LIDOCAINE-EPINEPHRINE 1 %-1:100000 IJ SOLN
INTRAMUSCULAR | Status: DC | PRN
  Administered 2019-11-11: 5 mL

## 2019-11-11 MED ORDER — SUGAMMADEX SODIUM 200 MG/2ML IV SOLN
INTRAVENOUS | Status: DC | PRN
  Administered 2019-11-11 (×2): 100 mg via INTRAVENOUS

## 2019-11-11 MED ORDER — HYDRALAZINE HCL 20 MG/ML IJ SOLN
10.0000 mg | Freq: Once | INTRAMUSCULAR | Status: AC
  Administered 2019-11-11: 10 mg via INTRAVENOUS

## 2019-11-11 MED ORDER — BUPIVACAINE HCL (PF) 0.5 % IJ SOLN
INTRAMUSCULAR | Status: AC
  Filled 2019-11-11: qty 30

## 2019-11-11 MED ORDER — LIDOCAINE 2% (20 MG/ML) 5 ML SYRINGE
INTRAMUSCULAR | Status: DC | PRN
  Administered 2019-11-11: 70 mg via INTRAVENOUS

## 2019-11-11 MED ORDER — ROSUVASTATIN CALCIUM 5 MG PO TABS
10.0000 mg | ORAL_TABLET | Freq: Every day | ORAL | Status: DC
  Administered 2019-11-11 – 2019-11-25 (×15): 10 mg via ORAL
  Filled 2019-11-11 (×15): qty 2

## 2019-11-11 MED ORDER — BISACODYL 10 MG RE SUPP: 10.0000 mg | Freq: Every day | RECTAL | Status: DC | PRN

## 2019-11-11 MED ORDER — PHENYLEPHRINE HCL-NACL 10-0.9 MG/250ML-% IV SOLN
INTRAVENOUS | Status: DC | PRN
  Administered 2019-11-11: 25 ug/min via INTRAVENOUS

## 2019-11-11 SURGICAL SUPPLY — 67 items
ADH SKN CLS APL DERMABOND .7 (GAUZE/BANDAGES/DRESSINGS) ×1
BAND INSRT 18 STRL LF DISP RB (MISCELLANEOUS) ×2
BAND RUBBER #18 3X1/16 STRL (MISCELLANEOUS) ×6 IMPLANT
BASKET BONE COLLECTION (BASKET) IMPLANT
BIT DRILL NEURO 2X3.1 SFT TUCH (MISCELLANEOUS) ×1 IMPLANT
BIT DRILL OZARK SU 2.3X14 (DRILL) IMPLANT
BLADE ULTRA TIP 2M (BLADE) IMPLANT
BNDG GAUZE ELAST 4 BULKY (GAUZE/BANDAGES/DRESSINGS) IMPLANT
BUR BARREL STRAIGHT FLUTE 4.0 (BURR) ×3 IMPLANT
CANISTER SUCT 3000ML PPV (MISCELLANEOUS) ×3 IMPLANT
CARTRIDGE OIL MAESTRO DRILL (MISCELLANEOUS) ×1 IMPLANT
COVER MAYO STAND STRL (DRAPES) ×3 IMPLANT
COVER WAND RF STERILE (DRAPES) ×3 IMPLANT
DECANTER SPIKE VIAL GLASS SM (MISCELLANEOUS) ×3 IMPLANT
DERMABOND ADVANCED (GAUZE/BANDAGES/DRESSINGS) ×2
DERMABOND ADVANCED .7 DNX12 (GAUZE/BANDAGES/DRESSINGS) ×1 IMPLANT
DIFFUSER DRILL AIR PNEUMATIC (MISCELLANEOUS) ×3 IMPLANT
DRAIN JACKSON PRATT 10MM FLAT (MISCELLANEOUS) ×2 IMPLANT
DRAPE HALF SHEET 40X57 (DRAPES) IMPLANT
DRAPE LAPAROTOMY 100X72 PEDS (DRAPES) ×3 IMPLANT
DRAPE MICROSCOPE LEICA (MISCELLANEOUS) ×3 IMPLANT
DRILL NEURO 2X3.1 SOFT TOUCH (MISCELLANEOUS) ×3
DRILL OZARK SU 2.3X14 (DRILL) ×3
DRSG OPSITE POSTOP 3X4 (GAUZE/BANDAGES/DRESSINGS) ×2 IMPLANT
DURAPREP 6ML APPLICATOR 50/CS (WOUND CARE) ×3 IMPLANT
ELECT COATED BLADE 2.86 ST (ELECTRODE) ×3 IMPLANT
ELECT REM PT RETURN 9FT ADLT (ELECTROSURGICAL) ×3
ELECTRODE REM PT RTRN 9FT ADLT (ELECTROSURGICAL) ×1 IMPLANT
EVACUATOR SILICONE 100CC (DRAIN) ×2 IMPLANT
GAUZE 4X4 16PLY RFD (DISPOSABLE) IMPLANT
GAUZE SPONGE 4X4 12PLY STRL (GAUZE/BANDAGES/DRESSINGS) IMPLANT
GLOVE BIO SURGEON STRL SZ8 (GLOVE) ×3 IMPLANT
GLOVE BIOGEL PI IND STRL 8 (GLOVE) ×1 IMPLANT
GLOVE BIOGEL PI IND STRL 8.5 (GLOVE) ×1 IMPLANT
GLOVE BIOGEL PI INDICATOR 8 (GLOVE) ×2
GLOVE BIOGEL PI INDICATOR 8.5 (GLOVE) ×2
GLOVE ECLIPSE 8.0 STRL XLNG CF (GLOVE) ×3 IMPLANT
GLOVE EXAM NITRILE XL STR (GLOVE) IMPLANT
GOWN STRL REUS W/ TWL LRG LVL3 (GOWN DISPOSABLE) IMPLANT
GOWN STRL REUS W/ TWL XL LVL3 (GOWN DISPOSABLE) IMPLANT
GOWN STRL REUS W/TWL 2XL LVL3 (GOWN DISPOSABLE) IMPLANT
GOWN STRL REUS W/TWL LRG LVL3 (GOWN DISPOSABLE)
GOWN STRL REUS W/TWL XL LVL3 (GOWN DISPOSABLE)
HALTER HD/CHIN CERV TRACTION D (MISCELLANEOUS) ×3 IMPLANT
HEMOSTAT POWDER KIT SURGIFOAM (HEMOSTASIS) ×3 IMPLANT
KIT BASIN OR (CUSTOM PROCEDURE TRAY) ×3 IMPLANT
KIT TURNOVER KIT B (KITS) ×3 IMPLANT
NDL HYPO 25X1 1.5 SAFETY (NEEDLE) ×1 IMPLANT
NDL SPNL 22GX3.5 QUINCKE BK (NEEDLE) ×2 IMPLANT
NEEDLE HYPO 25X1 1.5 SAFETY (NEEDLE) ×3 IMPLANT
NEEDLE SPNL 22GX3.5 QUINCKE BK (NEEDLE) ×6 IMPLANT
NS IRRIG 1000ML POUR BTL (IV SOLUTION) ×3 IMPLANT
OIL CARTRIDGE MAESTRO DRILL (MISCELLANEOUS) ×3
PACK LAMINECTOMY NEURO (CUSTOM PROCEDURE TRAY) ×3 IMPLANT
PAD ARMBOARD 7.5X6 YLW CONV (MISCELLANEOUS) ×3 IMPLANT
PEEK SPACER AVS AS 6X14X16 4D (Cage) ×4 IMPLANT
PIN DISTRACTION 14MM (PIN) ×6 IMPLANT
PLATE OZARK 73 (Plate) ×2 IMPLANT
SCREW VA ST OZARK 4X14 (Screw) ×20 IMPLANT
SPACER CERV AVS 14X16X5MM 4D (Spacer) ×4 IMPLANT
SPONGE INTESTINAL PEANUT (DISPOSABLE) ×3 IMPLANT
SPONGE SURGIFOAM ABS GEL 100 (HEMOSTASIS) IMPLANT
STAPLER SKIN PROX WIDE 3.9 (STAPLE) IMPLANT
SUT VIC AB 3-0 SH 8-18 (SUTURE) ×3 IMPLANT
TOWEL GREEN STERILE (TOWEL DISPOSABLE) ×3 IMPLANT
TOWEL GREEN STERILE FF (TOWEL DISPOSABLE) ×3 IMPLANT
WATER STERILE IRR 1000ML POUR (IV SOLUTION) ×3 IMPLANT

## 2019-11-11 NOTE — Progress Notes (Signed)
Pt transferred to unit from ED.Vitals Signs WNL, with Bps elevated. Assessment completed. Pt c/o left leg pain that has occurred prior to admission to unit from ED nurse report.    Patient oriented to room. Call bell in reach and bed alarm on.  Denver Faster, RN

## 2019-11-11 NOTE — Plan of Care (Signed)
  Problem: Education: Goal: Ability to verbalize activity precautions or restrictions will improve Outcome: Progressing Goal: Knowledge of the prescribed therapeutic regimen will improve Outcome: Progressing   Problem: Activity: Goal: Will remain free from falls Outcome: Progressing  Pre-surgical education provided.  Patient had multiple questions and expressing concerns re:  post-op pain management.  She was assured that a specific regimen would be individualized for her specific needs.

## 2019-11-11 NOTE — Op Note (Signed)
11/11/2019  5:05 PM  PATIENT:  [name]  74 y.o. female  PRE-OPERATIVE DIAGNOSIS:  Cervical stenosis, herniated nucleus pulposus with myelopathy, cervical radiculopathy, cervicalgia C 34, C 45, C 56, C 67   POST-OPERATIVE DIAGNOSIS:  Cervical stenosis, herniated nucleus pulposus with myelopathy, cervical radiculopathy, cervicalgia C 34, C 45, C 56, C 67   PROCEDURE:  Procedure(s): Cervical three-four Cervical four-five Cervical five-six Cervical six-seven  Anterior cervical decompression/discectomy/fusion (N/A) with PEEK cages, autograft, plate  SURGEON:  Surgeon(s) and Role:    * Wiley Flicker, MD - Primary  PHYSICIAN ASSISTANT:  McDaniel, NP  ASSISTANTS: Poteat, RN   ANESTHESIA:   general  EBL:  50 mL   BLOOD ADMINISTERED:none  DRAINS: (10) Jackson-Pratt drain(s) with closed bulb suction in the prevertebral space   LOCAL MEDICATIONS USED:  MARCAINE    and LIDOCAINE   SPECIMEN:  No Specimen  DISPOSITION OF SPECIMEN:  N/A  COUNTS:  YES  TOURNIQUET:  * No tourniquets in log *  DICTATION: Patient was brought to operating room and following the smooth and uncomplicated induction of general endotracheal anesthesia her head was placed on a horseshoe head holder she was placed in 5 pounds of Holter traction and her anterior neck was prepped and draped in usual sterile fashion. An incision was made on the left side of midline after infiltrating the skin and subcutaneous tissues with local lidocaine. The platysmal layer was incised and subplatysmal dissection was performed exposing the anterior border sternocleidomastoid muscle. Using blunt dissection the carotid sheath was kept lateral and trachea and esophagus kept medial exposing the anterior cervical spine. A bent spinal needle was placed it was felt to be the C 34 and C 45  levels and this was confirmed on intraoperative x-ray. Longus coli muscles were taken down from the anterior cervical spine using electrocautery and key  elevator and self-retaining retractor was placed. The interspaces from C3/4 to C6/7  were incised and a thorough discectomies were performed. Distraction pins were placed at the C3/4 level. Uncinate spurs and central spondylitic ridges were drilled down with a high-speed drill. The spinal cord dura and both C4 nerve roots were widely decompressed. Hemostasis was assured. After trial sizing a 6 mm large footprint lordotic  peek interbody cage was selected and packed with local autograft. The graft was tamped into position and countersunk appropriately.  Distraction pins were placed at the C4/5 level. Uncinate spurs and central spondylitic ridges were drilled down with a high-speed drill. The spinal cord dura and both C5 nerve roots were widely decompressed. Hemostasis was assured.  After trial sizing a 6 mm large footprint lordotic  peek interbody cage was selected and packed with local autograft. The graft was tamped into position and countersunk appropriately. The graft was tamped into position and countersunk appropriately. The retractor was moved and the interspace at C6/7 was incised and a thorough discectomy was performed. Distraction pins were placed. Uncinate spurs and central spondylitic ridges were drilled down with a high-speed drill. The spinal cord dura and both C7 nerve roots were widely decompressed. Hemostasis was assured.  After trial sizing a 5 mm large footprint lordotic  peek interbody cage was selected and packed with local autograft. The graft was tamped into position and countersunk appropriately. The interspace at C5/6 was incised and a thorough discectomy was performed. Distraction pins were placed. Uncinate spurs and central spondylitic ridges were drilled down with a high-speed drill.   This interspace was overgrown with bone and it was drilled   open with the high speed drill. The spinal cord dura and both C6 nerve roots were widely decompressed. Hemostasis was assured. After trial sizing a  5 mm large footprint lordotic  peek interbody cage was selected and packed with local autograft. The graft was tamped into position and countersunk appropriately. A 73 mm Ozark anterior cervical plate was affixed to the cervical spine with 14 mm variable-angle screws 2 at C3, 2 at C4, 2 at C5,  and 2 at C6, and 2 at C7. All screws were well-positioned and locking mechanisms were engaged. Soft tissues were inspected and found to be in good repair. The wound was irrigated. A final x-ray was obtained with good visualization at C3 through C6 with the interbody grafts well visualized. A #10 JP drain was inserted through a separate stab incision. The platysma layer was closed with 3-0 Vicryl stitches and the skin was reapproximated with 3-0 Vicryl subcuticular stitches. The wound was dressed with Dermabond and an occlusive dressing. Counts were correct at the end of the case. Patient was extubated and taken to recovery in stable and satisfactory condition.   PLAN OF CARE: Admit to inpatient   PATIENT DISPOSITION:  PACU - hemodynamically stable.   Delay start of Pharmacological VTE agent (>24hrs) due to surgical blood loss or risk of bleeding: yes

## 2019-11-11 NOTE — H&P (Signed)
Council MechanicMcDaniel, Joshua L, NP  Nurse Practitioner  Neurosurgery  Consult Note     Attested  Date of Service:  11/10/2019 12:04 PM          Attested      Attestation signed by Maeola HarmanStern, Takuma Cifelli, MD at 11/11/2019 8:42 AM  Patient has cervical myelopathy and multilevel radiculopathy on exam.  Proceed with ACDF C 34, C 45, C 56, C 67 levels.    Expand All Collapse All  Show:Clear all [x] Manual[x] Template[x] Copied  Added by: [x] Council MechanicMcDaniel, Joshua L, NP  [] Hover for details Reason for Consult:Weakness with ambulation, bilateral hand numbness Referring Physician: Dr. Rosalia Hammersay  HPI: Adriana StacksWilma [name] a 74 y.o.female who presents to the ED for c/o neck pain and numbness and pain in her bilateral hands and LUE. She reported that her symptoms began about one month ago after suffering a fall at [facility]. She presented for evaluation multiple times related to her bilateral hand numbness and was instructed to follow up as an outpatient. She has not followed up as an outpatient and presented to the ED today where a cervical MRI was performed. Her MRI was most remarkable for critical spinal stenosis with spinal cord compression at the C3/4 level and subsequently led to neurosurgical consult.       Past Medical History:  Diagnosis Date  . Diabetes mellitus without complication (HCC)   . GERD 09/04/2006   Qualifier: Diagnosis of  By: Duke SalviaGore, Denise    . History of echocardiogram    a. 2D ECHO: 11/06/2013 EF 65%; no WMA. Mild TR. PA pk pressure 43 mm HG  . Hypertension   . Stroke [facility](HCC)          Past Surgical History:  Procedure Laterality Date  . ABDOMINAL HYSTERECTOMY    . LEFT HEART CATHETERIZATION WITH CORONARY ANGIOGRAM N/A 11/05/2013   Procedure: LEFT HEART CATHETERIZATION WITH CORONARY ANGIOGRAM;  Surgeon: Lennette Biharihomas A Kelly, MD;  [facility];  Service: Cardiovascular;  Laterality: N/A;    No family history on file.  Social History:  reports that she has never smoked. She has  never used smokeless tobacco. She reports that she does not drink alcohol and does not use drugs.  Allergies:       Allergies  Allergen Reactions  . Codeine Other (See Comments)    REACTION: Hallucinations  . Ibuprofen Other (See Comments)    REACTION: Elevated Blood Pressure  . Imdur [Isosorbide Nitrate] Other (See Comments)    Headache & upset stomach    Medications: I have reviewed the patient's current medications.  Lab Results Last 48 Hours        Results for orders placed or performed during the hospital encounter of 11/09/19 (from the past 48 hour(s))  Basic metabolic panel     Status: Abnormal   Collection Time: 11/09/19 11:55 PM  Result Value Ref Range   Sodium 134 (L) 135 - 145 mmol/L   Potassium 3.5 3.5 - 5.1 mmol/L   Chloride 94 (L) 98 - 111 mmol/L   CO2 28 22 - 32 mmol/L   Glucose, Bld 173 (H) 70 - 99 mg/dL    Comment: Glucose reference range applies only to samples taken after fasting for at least 8 hours.   BUN 21 8 - 23 mg/dL   Creatinine, Ser 4.091.53 (H) 0.44 - 1.00 mg/dL   Calcium 8.8 (L) 8.9 - 10.3 mg/dL   GFR, Estimated 34 (L) >60 mL/min   Anion gap 12 5 - 15    Comment:  Performed at [facility] Lab, [address], [place], [place] [address]  CBC     Status: None   Collection Time: 11/09/19 11:55 PM  Result Value Ref Range   WBC 10.1 4.0 - 10.5 K/uL   RBC 4.07 3.87 - 5.11 MIL/uL   Hemoglobin 12.3 12.0 - 15.0 g/dL   HCT 27.0 36 - 46 %   MCV 93.1 80.0 - 100.0 fL   MCH 30.2 26.0 - 34.0 pg   MCHC 32.5 30.0 - 36.0 g/dL   RDW 35.0 09.3 - 81.8 %   Platelets 218 150 - 400 K/uL   nRBC 0.0 0.0 - 0.2 %    Comment: Performed at [facility], [address], [place], [place] [address]  Troponin I (High Sensitivity)     Status: None   Collection Time: 11/09/19 11:55 PM  Result Value Ref Range   Troponin I (High Sensitivity) 11 <18 ng/L    Comment: (NOTE) Elevated high sensitivity troponin I (hsTnI) values and  significant  changes across serial measurements may suggest ACS but many other  chronic and acute conditions are known to elevate hsTnI results.  Refer to the "Links" section for chest pain algorithms and additional  guidance. Performed at [facility], [address] [place], [place] [address]   Protime-INR (order if Patient is taking Coumadin / Warfarin)     Status: None   Collection Time: 11/09/19 11:55 PM  Result Value Ref Range   Prothrombin Time 11.5 11.4 - 15.2 seconds   INR 0.9 0.8 - 1.2    Comment: (NOTE) INR goal varies based on device and disease states. Performed at [facility], [address], [place], [place] [address]   Troponin I (High Sensitivity)     Status: None   Collection Time: 11/10/19  2:14 AM  Result Value Ref Range   Troponin I (High Sensitivity) 9 <18 ng/L    Comment: (NOTE) Elevated high sensitivity troponin I (hsTnI) values and significant  changes across serial measurements may suggest ACS but many other  chronic and acute conditions are known to elevate hsTnI results.  Refer to the "Links" section for chest pain algorithms and additional  guidance. Performed at [facility], [address], [place], [place] [address]        Imaging Results (Last 48 hours)  DG Chest 2 View  Result Date: 11/10/2019 CLINICAL DATA:  Central chest pain with mild shortness of breath. EXAM: CHEST - 2 VIEW COMPARISON:  10/31/2019 FINDINGS: Mild hyperinflation. Normal heart size and pulmonary vascularity. No focal airspace disease or consolidation in the lungs. No blunting of costophrenic angles. No pneumothorax. Mediastinal contours appear intact. Degenerative changes in the spine and shoulders. IMPRESSION: No active cardiopulmonary disease. Electronically Signed   By: Burman Nieves M.D.   On: 11/10/2019 00:24   MR Cervical Spine W or Wo Contrast  Addendum Date: 11/10/2019   ADDENDUM REPORT: 11/10/2019 10:06 ADDENDUM: Omitted  finding of active facet arthritis on the right at C3-4 where there is marrow edema and enhancement. Electronically Signed   By: Marnee Spring M.D.   On: 11/10/2019 10:06   Result Date: 11/10/2019 CLINICAL DATA:  Chronic neck pain.  Cervical spine degeneration EXAM: MRI CERVICAL SPINE WITHOUT AND WITH CONTRAST TECHNIQUE: Multiplanar and multiecho pulse sequences of the cervical spine, to include the craniocervical junction and cervicothoracic junction, were obtained without and with intravenous contrast. CONTRAST:  33mL GADAVIST GADOBUTROL 1 MMOL/ML IV SOLN  COMPARISON:  Cervical spine CT 10/12/2019 FINDINGS: Alignment: Degenerative reversal of cervical lordosis.  No listhesis Vertebrae: No fracture, evidence of discitis, or bone lesion. Cord: Flattening with mild T2 hyperintensity at C3-4. Less advanced flattening at C4-5. Posterior Fossa, vertebral arteries, paraspinal tissues: Left maxillary sinusitis with mucosal thickening and central inspissated material. Disc levels: C2-3: Unremarkable. C3-4: Disc narrowing and bulging with central protrusion. Ligamentum flavum thickening. Advanced spinal stenosis with cord compression to 3 mm in the midline. Uncovertebral spurring on both sides. Mild bilateral foraminal narrowing. C4-5: Disc narrowing and bulging with endplate and uncovertebral ridging asymmetric to the left. Ligamentum flavum thickening. There is spinal stenosis with cord flattening. Advanced left foraminal impingement. C5-6: Disc narrowing and bulging with endplate ridging eccentric to the left. Uncovertebral spurring asymmetric to the left. Spinal stenosis with mild left cord flattening. Left foraminal impingement. C6-7: Disc narrowing and bulging with uncovertebral ridging. Biforaminal stenosis, moderate appearing on the right and more advanced appearing on the left. Mild spinal stenosis without cord flattening C7-T1:Unremarkable. IMPRESSION: 1. Degenerative spinal stenosis from C3-4 to C6-7,  particularly severe at C3-4 where there is cord compression and cord signal abnormality. 2. Foraminal impingement on the left at C4-5 to C6-7. 3. Left maxillary sinusitis with complete opacification, new from a 2020 head CT. Electronically Signed: By: Marnee Spring M.D. On: 11/10/2019 10:01   MR THORACIC SPINE W WO CONTRAST  Result Date: 11/10/2019 CLINICAL DATA:  Numbness in hands with neck pain. Fall several weeks ago at [facility]. EXAM: MRI THORACIC WITHOUT AND WITH CONTRAST TECHNIQUE: Multiplanar and multiecho pulse sequences of the thoracic spine were obtained without and with intravenous contrast. CONTRAST:  83mL GADAVIST GADOBUTROL 1 MMOL/ML IV SOLN COMPARISON:  None. FINDINGS: MRI THORACIC SPINE FINDINGS Alignment:  Physiologic Vertebrae: No fracture, evidence of discitis, or bone lesion. Cord:  Normal signal and morphology. Paraspinal and other soft tissues: No paraspinal inflammation or mass seen. Trace right pleural effusion. Disc levels: Spondylitic spurring and diffuse disc desiccation in keeping with age. Small disc protrusions seen at T7-8 and T8-9. Mild generalized facet spurring. No high-grade stenosis. IMPRESSION: Ordinary degenerative changes without impingement or visible inflammation. Trace right pleural effusion. Electronically Signed   By: Marnee Spring M.D.   On: 11/10/2019 10:04     Review of Systems Blood pressure (!) 165/92, pulse 78, temperature 98.4 F (36.9 C), temperature source Oral, resp. rate 20, height 5\' 4"  (1.626 m), weight 70 kg, SpO2 98 %. Physical Exam Constitutional:      General: She is not in acute distress.    Appearance: She is well-developed and normal weight.  HENT:     Head: Normocephalic and atraumatic.  Eyes:     Extraocular Movements: Extraocular movements intact.     Pupils: Pupils are equal, round, and reactive to light.  Musculoskeletal:     Cervical back: Normal range of motion and neck supple.  Neurological:     General: No focal deficit  present.     Mental Status: She is alert and oriented to person, place, and time.     GCS: GCS eye subscore is 4. GCS verbal subscore is 5. GCS motor subscore is 6.     Cranial Nerves: No cranial nerve deficit.     Sensory: Sensation is intact.     Motor: Weakness present.     Deep Tendon Reflexes: Reflexes abnormal.  Psychiatric:        Mood and Affect: Mood normal.        Behavior: Behavior  normal.        Thought Content: Thought content normal.        Judgment: Judgment normal.   She is hyperreflexive and has a positive Hoffman's sign bilaterally. Positive Spurling's sign on the left.   Left hand intrinsics 4/5 Right hand intrinsics 4/5 Left deltoid 4+/5 Left biceps 4/5 Left wrist flexion 4/5    Assessment/Plan: 56. y.o. female who presents with bilateral hand numbness, LUE numbness/pain, and neck pain. 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 Her imaging reveled severe multilevel degenerated disks with critical spinal stenosis measuring 4 mm with cord compression and cord signal abnormality at the C3/4 level. There is also severe spinal stenosis at the C4/5, C5/6, and C6/7 levels. Severe foraminal stenosis on the left at C4/5, C5/6, and C6/7.   Since the patient has critical spinal stenosis and is myelopathic and has such significant weakness, I have recommended the patient undergo an urgent decompression and fusion to decompress her spinal cord and exiting nerve roots. This will consist of a C/3/4, C4/5, C5/6, and C6/7 anterior cervical decompression and fusion. Risks and benefits were discussed with the patient and she wishes to proceed with surgery. Detailed patient education was given to patient and all her questions were answered. Her surgery is scheduled for Wednesday, October 20th. Place and maintain hard cervical collar.  Council Mechanic, DNP, NP-C 11/10/2019, 12:04 PM            Cosigned by: Maeola Harman, MD at 11/11/2019 8:42 AM    Revision History      Routing History           Note Details  Author Council Mechanic, NP File Time 11/11/2019 8:42 AM  Author Type Nurse Practitioner Status Signed  Last Editor Council Mechanic, NP Service Neurosurgery  [facility] Acct # [id number] Admit Date 11/09/2019

## 2019-11-11 NOTE — Brief Op Note (Signed)
11/11/2019  5:05 PM  PATIENT:  [name]  74 y.o. female  PRE-OPERATIVE DIAGNOSIS:  Cervical stenosis, herniated nucleus pulposus with myelopathy, cervical radiculopathy, cervicalgia C 34, C 45, C 56, C 67   POST-OPERATIVE DIAGNOSIS:  Cervical stenosis, herniated nucleus pulposus with myelopathy, cervical radiculopathy, cervicalgia C 34, C 45, C 56, C 67   PROCEDURE:  Procedure(s): Cervical three-four Cervical four-five Cervical five-six Cervical six-seven  Anterior cervical decompression/discectomy/fusion (N/A) with PEEK cages, autograft, plate  SURGEON:  Surgeon(s) and Role:    Maeola Harman, MD - Primary  PHYSICIAN ASSISTANT:  Julien Girt, NP  ASSISTANTS: Poteat, RN   ANESTHESIA:   general  EBL:  50 mL   BLOOD ADMINISTERED:none  DRAINS: (10) Jackson-Pratt drain(s) with closed bulb suction in the prevertebral space   LOCAL MEDICATIONS USED:  MARCAINE    and LIDOCAINE   SPECIMEN:  No Specimen  DISPOSITION OF SPECIMEN:  N/A  COUNTS:  YES  TOURNIQUET:  * No tourniquets in log *  DICTATION: Patient was brought to operating room and following the smooth and uncomplicated induction of general endotracheal anesthesia her head was placed on a horseshoe head holder she was placed in 5 pounds of Holter traction and her anterior neck was prepped and draped in usual sterile fashion. An incision was made on the left side of midline after infiltrating the skin and subcutaneous tissues with local lidocaine. The platysmal layer was incised and subplatysmal dissection was performed exposing the anterior border sternocleidomastoid muscle. Using blunt dissection the carotid sheath was kept lateral and trachea and esophagus kept medial exposing the anterior cervical spine. A bent spinal needle was placed it was felt to be the C 34 and C 45  levels and this was confirmed on intraoperative x-ray. Longus coli muscles were taken down from the anterior cervical spine using electrocautery and key  elevator and self-retaining retractor was placed. The interspaces from C3/4 to C6/7  were incised and a thorough discectomies were performed. Distraction pins were placed at the C3/4 level. Uncinate spurs and central spondylitic ridges were drilled down with a high-speed drill. The spinal cord dura and both C4 nerve roots were widely decompressed. Hemostasis was assured. After trial sizing a 6 mm large footprint lordotic  peek interbody cage was selected and packed with local autograft. The graft was tamped into position and countersunk appropriately.  Distraction pins were placed at the C4/5 level. Uncinate spurs and central spondylitic ridges were drilled down with a high-speed drill. The spinal cord dura and both C5 nerve roots were widely decompressed. Hemostasis was assured.  After trial sizing a 6 mm large footprint lordotic  peek interbody cage was selected and packed with local autograft. The graft was tamped into position and countersunk appropriately. The graft was tamped into position and countersunk appropriately. The retractor was moved and the interspace at C6/7 was incised and a thorough discectomy was performed. Distraction pins were placed. Uncinate spurs and central spondylitic ridges were drilled down with a high-speed drill. The spinal cord dura and both C7 nerve roots were widely decompressed. Hemostasis was assured.  After trial sizing a 5 mm large footprint lordotic  peek interbody cage was selected and packed with local autograft. The graft was tamped into position and countersunk appropriately. The interspace at C5/6 was incised and a thorough discectomy was performed. Distraction pins were placed. Uncinate spurs and central spondylitic ridges were drilled down with a high-speed drill.   This interspace was overgrown with bone and it was drilled  open with the high speed drill. The spinal cord dura and both C6 nerve roots were widely decompressed. Hemostasis was assured. After trial sizing a  5 mm large footprint lordotic  peek interbody cage was selected and packed with local autograft. The graft was tamped into position and countersunk appropriately. A 73 mm Ozark anterior cervical plate was affixed to the cervical spine with 14 mm variable-angle screws 2 at C3, 2 at C4, 2 at C5,  and 2 at C6, and 2 at C7. All screws were well-positioned and locking mechanisms were engaged. Soft tissues were inspected and found to be in good repair. The wound was irrigated. A final x-ray was obtained with good visualization at C3 through C6 with the interbody grafts well visualized. A #10 JP drain was inserted through a separate stab incision. The platysma layer was closed with 3-0 Vicryl stitches and the skin was reapproximated with 3-0 Vicryl subcuticular stitches. The wound was dressed with Dermabond and an occlusive dressing. Counts were correct at the end of the case. Patient was extubated and taken to recovery in stable and satisfactory condition.   PLAN OF CARE: Admit to inpatient   PATIENT DISPOSITION:  PACU - hemodynamically stable.   Delay start of Pharmacological VTE agent (>24hrs) due to surgical blood loss or risk of bleeding: yes

## 2019-11-11 NOTE — Progress Notes (Signed)
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 No acute events were reported overnight. Patient continues to agree with proceeding with surgery.   Objective: Vital signs in last 24 hours: Temp:  [98.5 F (36.9 C)-98.8 F (37.1 C)] 98.5 F (36.9 C) (10/20 0802) Pulse Rate:  [68-97] 77 (10/20 0802) Resp:  [13-23] 13 (10/20 0802) BP: (130-188)/(63-94) 187/89 (10/20 0802) SpO2:  [96 %-99 %] 97 % (10/20 0802)  Intake/Output from previous day: No intake/output data recorded. Intake/Output this shift: No intake/output data recorded.  Physical Exam Constitutional:      General: She is not in acute distress.    Appearance: She is well-developed and normal weight.  HENT:     Head: Normocephalic and atraumatic.  Eyes:     Extraocular Movements: Extraocular movements intact.     Pupils: Pupils are equal, round, and reactive to light.  Musculoskeletal:     Cervical back: Normal range of motion and neck supple.  Neurological:     General: No focal deficit present.     Mental Status: She is alert and oriented to person, place, and time.     GCS: GCS eye subscore is 4. GCS verbal subscore is 5. GCS motor subscore is 6.     Cranial Nerves: No cranial nerve deficit.     Sensory: Sensation is intact.     Motor: Weakness present.     Deep Tendon Reflexes: Reflexes abnormal.  Psychiatric:        Mood and Affect: Mood normal.        Behavior: Behavior normal.        Thought Content: Thought content normal.        Judgment: Judgment normal.    She is hyperreflexive and has a positive Hoffman's sign bilaterally. Positive Spurling's sign on the left.    Left hand intrinsics 4/5 Right hand intrinsics 4/5 Left deltoid 4+/5 Left biceps 4/5 Left wrist flexion 4/5  Lab Results: Recent Labs    11/08/19 1618 11/09/19 2355  WBC 7.8 10.1  HGB 11.7* 12.3  HCT 36.5 37.9  PLT 213 218   BMET Recent Labs     11/08/19 1618 11/09/19 2355  NA 134* 134*  K 3.3* 3.5  CL 95* 94*  CO2 28 28  GLUCOSE 256* 173*  BUN 14 21  CREATININE 1.33* 1.53*  CALCIUM 8.8* 8.8*    Studies/Results: DG Chest 2 View  Result Date: 11/10/2019 CLINICAL DATA:  Central chest pain with mild shortness of breath. EXAM: CHEST - 2 VIEW COMPARISON:  10/31/2019 FINDINGS: Mild hyperinflation. Normal heart size and pulmonary vascularity. No focal airspace disease or consolidation in the lungs. No blunting of costophrenic angles. No pneumothorax. Mediastinal contours appear intact. Degenerative changes in the spine and shoulders. IMPRESSION: No active cardiopulmonary disease. Electronically Signed   By: Burman Nieves M.D.   On: 11/10/2019 00:24   DG Cervical Spine With Flex & Extend  Result Date: 11/10/2019 CLINICAL DATA:  Cervical cord compression EXAM: CERVICAL SPINE COMPLETE WITH FLEXION AND EXTENSION VIEWS COMPARISON:  MRI 11/10/2019 FINDINGS: Seven view radiograph cervical spine. Odontoid view is partially obscured by overlying osseous structures, however, the lateral masses of C1 appear aligned with the body of C2. Oblique views fail to profiled the neural foramina bilaterally. There is straightening of the cervical spine. No acute fracture or listhesis of the cervical spine. Vertebral body height has been preserved. There is intervertebral disc space narrowing and endplate remodeling of  C4-C7 in keeping with changes of advanced degenerative disc disease. There is preservation of remaining intervertebral disc heights. 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 Flexion and extension views demonstrate hypomobility at C4-C7. No abnormal angulatory or translatory motion. The prevertebral soft tissues are unremarkable. IMPRESSION: Advanced degenerative disc disease C4-C7 with relative hypomobility at this level. 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 Electronically Signed   By: Helyn Numbers MD   On:  11/10/2019 13:17   MR Cervical Spine W or Wo Contrast  Addendum Date: 11/10/2019   ADDENDUM REPORT: 11/10/2019 10:06 ADDENDUM: Omitted finding of active facet arthritis on the right at C3-4 where there is marrow edema and enhancement. Electronically Signed   By: Marnee Spring M.D.   On: 11/10/2019 10:06   Result Date: 11/10/2019 CLINICAL DATA:  Chronic neck pain.  Cervical spine degeneration EXAM: MRI CERVICAL SPINE WITHOUT AND WITH CONTRAST TECHNIQUE: Multiplanar and multiecho pulse sequences of the cervical spine, to include the craniocervical junction and cervicothoracic junction, were obtained without and with intravenous contrast. CONTRAST:  45mL GADAVIST GADOBUTROL 1 MMOL/ML IV SOLN COMPARISON:  Cervical spine CT 10/12/2019 FINDINGS: Alignment: Degenerative reversal of cervical lordosis.  No listhesis Vertebrae: No fracture, evidence of discitis, or bone lesion. Cord: Flattening with mild T2 hyperintensity at C3-4. Less advanced flattening at C4-5. Posterior Fossa, vertebral arteries, paraspinal tissues: Left maxillary sinusitis with mucosal thickening and central inspissated material. Disc levels: C2-3: Unremarkable. C3-4: Disc narrowing and bulging with central protrusion. Ligamentum flavum thickening. Advanced spinal stenosis with cord compression to 3 mm in the midline. Uncovertebral spurring on both sides. Mild bilateral foraminal narrowing. C4-5: Disc narrowing and bulging with endplate and uncovertebral ridging asymmetric to the left. Ligamentum flavum thickening. There is spinal stenosis with cord flattening. Advanced left foraminal impingement. C5-6: Disc narrowing and bulging with endplate ridging eccentric to the left. Uncovertebral spurring asymmetric to the left. Spinal stenosis with mild left cord flattening. Left foraminal impingement. C6-7: Disc narrowing and bulging with uncovertebral ridging. Biforaminal stenosis, moderate appearing on the right and more advanced appearing on the  left. Mild spinal stenosis without cord flattening C7-T1:Unremarkable. IMPRESSION: 1. Degenerative spinal stenosis from C3-4 to C6-7, particularly severe at C3-4 where there is cord compression and cord signal abnormality. 2. Foraminal impingement on the left at C4-5 to C6-7. 3. Left maxillary sinusitis with complete opacification, new from a 2020 head CT. Electronically Signed: By: Marnee Spring M.D. On: 11/10/2019 10:01   MR THORACIC SPINE W WO CONTRAST  Result Date: 11/10/2019 CLINICAL DATA:  Numbness in hands with neck pain. Fall several weeks ago at [facility]. EXAM: MRI THORACIC WITHOUT AND WITH CONTRAST TECHNIQUE: Multiplanar and multiecho pulse sequences of the thoracic spine were obtained without and with intravenous contrast. CONTRAST:  77mL GADAVIST GADOBUTROL 1 MMOL/ML IV SOLN COMPARISON:  None. FINDINGS: MRI THORACIC SPINE FINDINGS Alignment:  Physiologic Vertebrae: No fracture, evidence of discitis, or bone lesion. Cord:  Normal signal and morphology. Paraspinal and other soft tissues: No paraspinal inflammation or mass seen. Trace right pleural effusion. Disc levels: Spondylitic spurring and diffuse disc desiccation in keeping with age. Small disc protrusions seen at T7-8 and T8-9. Mild generalized facet spurring. No high-grade stenosis. IMPRESSION: Ordinary degenerative changes without impingement or visible inflammation. Trace right pleural effusion. Electronically Signed   By: Marnee Spring M.D.   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 We will  proceed with ACDF of C3/4, C4/5, C5/6, and C6/7 this afternoon.   LOS: 1 day     Council Mechanic 11/11/2019, 9:01 AM

## 2019-11-11 NOTE — OR Nursing (Addendum)
Pt is awake,alert and oriented to self, situation which is baseline for patient. Reviewed admission and on going care with receiving RN. Pt is in NAD at this time and is ready to be transferred to floor. Will con't to monitor until pt is transferred. Belongings on bed with patient- pt brace Pt has +cms and moving all extremities

## 2019-11-11 NOTE — Progress Notes (Signed)
2220-consent form signed   0000- NPO status  0050-Pt given CHG bath

## 2019-11-11 NOTE — Anesthesia Postprocedure Evaluation (Signed)
Anesthesia Post Note  Patient: Cyrene Gharibian  Procedure(s) Performed: Cervical three-four Cervical four-five Cervical five-six Cervical six-seven  Anterior cervical decompression/discectomy/fusion (N/A Spine Cervical)     Patient location during evaluation: PACU Anesthesia Type: General Level of consciousness: sedated Pain management: pain level controlled Vital Signs Assessment: post-procedure vital signs reviewed and stable Respiratory status: spontaneous breathing and respiratory function stable Cardiovascular status: stable Postop Assessment: no apparent nausea or vomiting Anesthetic complications: no   No complications documented.  Last Vitals:  Vitals:   11/11/19 1847 11/11/19 1936  BP: (!) 186/83   Pulse: 94 90  Resp: 17   Temp: (!) 36.3 C (!) 36.1 C  SpO2: 97% 98%    Last Pain:  Vitals:   11/11/19 1936  TempSrc: Oral  PainSc: 0-No pain                 Jamisyn Langer DANIEL

## 2019-11-11 NOTE — Transfer of Care (Signed)
Immediate Anesthesia Transfer of Care Note  Patient: [name]  Procedure(s) Performed: Cervical three-four Cervical four-five Cervical five-six Cervical six-seven  Anterior cervical decompression/discectomy/fusion (N/A Spine Cervical)  Patient Location: PACU  Anesthesia Type:General  Level of Consciousness: awake, alert  and oriented  Airway & Oxygen Therapy: Patient Spontanous Breathing and Patient connected to face mask oxygen  Post-op Assessment: Report given to RN and Post -op Vital signs reviewed and stable  Post vital signs: Reviewed and stable - Patient hypertensive above her baseline, Dr Desmond Lope at bedside to evaluate. 10mg  x 2 of labetalol given. Patient otherwise stable  Last Vitals:  Vitals Value Taken Time  BP 215/90 11/11/19 1713  Temp 37.1 C 11/11/19 1704  Pulse 91 11/11/19 1719  Resp 22 11/11/19 1719  SpO2 94 % 11/11/19 1719  Vitals shown include unvalidated device data.  Last Pain:  Vitals:   11/11/19 1704  TempSrc:   PainSc: 0-No pain         Complications: No complications documented.

## 2019-11-11 NOTE — Anesthesia Procedure Notes (Signed)
Procedure Name: Intubation Date/Time: 11/11/2019 1:46 PM Performed by: Adria Dill, CRNA Pre-anesthesia Checklist: Patient identified, Emergency Drugs available, Suction available and Patient being monitored Patient Re-evaluated:Patient Re-evaluated prior to induction Oxygen Delivery Method: Circle system utilized Preoxygenation: Pre-oxygenation with 100% oxygen Induction Type: IV induction Ventilation: Mask ventilation without difficulty Laryngoscope Size: Glidescope and 3 Grade View: Grade I Tube type: Oral Tube size: 7.0 mm Number of attempts: 1 Airway Equipment and Method: Stylet and Oral airway Placement Confirmation: ETT inserted through vocal cords under direct vision,  positive ETCO2 and breath sounds checked- equal and bilateral Secured at: 21 cm Tube secured with: Tape Dental Injury: Teeth and Oropharynx as per pre-operative assessment  Difficulty Due To: Difficult Airway- due to reduced neck mobility and Difficulty was anticipated

## 2019-11-12 ENCOUNTER — Encounter (HOSPITAL_COMMUNITY): Payer: Self-pay | Admitting: Neurosurgery

## 2019-11-12 LAB — GLUCOSE, CAPILLARY
Glucose-Capillary: 205 mg/dL — ABNORMAL HIGH (ref 70–99)
Glucose-Capillary: 238 mg/dL — ABNORMAL HIGH (ref 70–99)
Glucose-Capillary: 239 mg/dL — ABNORMAL HIGH (ref 70–99)
Glucose-Capillary: 286 mg/dL — ABNORMAL HIGH (ref 70–99)
Glucose-Capillary: 305 mg/dL — ABNORMAL HIGH (ref 70–99)

## 2019-11-12 NOTE — Evaluation (Signed)
Physical Therapy Re-Evaluation Patient Details Name: [name] MRN: [id number] DOB: Oct 28, 1945 Today's Date: 11/12/2019   History of Present Illness  Pt is a 74 y/o female admitted secondary to fall, weakness, and numbness in bilateral hands.  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 She underwent C3-4, C4-5, C5-6, C6-7 ACDF on 10/20 PMH includes CVA, HTN, and DM.   Clinical Impression   Pt seen as re-evaluation post-cervical fusion. Pt presents with generalized weakness, impaired sensation RLE vs L, decreased knowledge and application of cervical precautions, difficulty performing mobility tasks, and decreased activity tolerance. Pt to benefit from acute PT to address deficits. Pt ambulated room distance, followed by short hallway distance, with min +2 assist for safety and maintaining pt balance. PT continuing to recommend SNF level of care post-acutely to return pt to PLOF. PT to progress mobility as tolerated, and will continue to follow acutely.      Follow Up Recommendations SNF    Equipment Recommendations  Other (comment) (TBD)    Recommendations for Other Services       Precautions / Restrictions Precautions Precautions: Fall;Cervical Precaution Booklet Issued: Yes (comment) Precaution Comments: Pt instructed in cervical precautions; BLT rules, log roll in and out of bed Required Braces or Orthoses: Cervical Brace Cervical Brace: Hard collar;Other (comment) (off in bed, off to shower, and off to ambulate to BR )      Mobility  Bed Mobility Overal bed mobility: Needs Assistance Bed Mobility: Rolling;Sit to Sidelying Rolling: Mod assist Sidelying to sit: Mod assist     Sit to sidelying: Mod assist General bed mobility comments: Mod assist for roll and moving from sit>sidelying for trunk and LE management.    Transfers Overall transfer level: Needs assistance Equipment used: Rolling walker (2  wheeled) Transfers: Sit to/from Stand Sit to Stand: Min assist;+2 safety/equipment Stand pivot transfers: Min assist;+2 safety/equipment       General transfer comment: Min assist for power up, steadying, hand placement when rising/sitting.  Ambulation/Gait Ambulation/Gait assistance: Min assist;+2 safety/equipment Gait Distance (Feet): 50 Feet (+10+25) Assistive device: Rolling walker (2 wheeled) Gait Pattern/deviations: Step-through pattern;Decreased stride length;Trunk flexed Gait velocity: decr   General Gait Details: Min assist to steady, intermittently guide RW, verbal cuing for keeping eyes open, placement in RW.  Stairs            Wheelchair Mobility    Modified Rankin (Stroke Patients Only)       Balance Overall balance assessment: Needs assistance Sitting-balance support: Feet supported;Single extremity supported Sitting balance-Leahy Scale: Fair Sitting balance - Comments: fair to poor; posterior leaning intermittently   Standing balance support: Bilateral upper extremity supported Standing balance-Leahy Scale: Poor Standing balance comment: requires UE support and min A                              Pertinent Vitals/Pain Pain Assessment: Faces Pain Score: 5  Faces Pain Scale: Hurts little more Pain Location: Lt shoulder and throat  Pain Descriptors / Indicators: Aching;Tingling;Sore Pain Intervention(s): Limited activity within patient's tolerance;Monitored during session;Repositioned    Home Living Family/patient expects to be discharged to:: Private residence Living Arrangements: Alone Available Help at Discharge: Family;Available PRN/intermittently Type of Home: House Home Access: Stairs to enter Entrance Stairs-Rails: None Entrance Stairs-Number of Steps: 1 Home Layout: One level Home Equipment: Shower seat Additional Comments: Pt reports she sponge bathes "for a long time" due to  fear of falling     Prior Function Level of  Independence: Independent         Comments: Pt reports she does not drive, but is fully independent      Hand Dominance   Dominant Hand: Right    Extremity/Trunk Assessment   Upper Extremity Assessment Upper Extremity Assessment: Defer to OT evaluation RUE Deficits / Details: shoulders grossly 2-/5 - 2/5; elbows distally 4-/5  RUE Sensation: decreased light touch LUE Deficits / Details: shoulders grossly 2-/5 - 2/5; elbows distally 4-/5 LUE Sensation: decreased light touch    Lower Extremity Assessment Lower Extremity Assessment: Generalized weakness RLE Deficits / Details: Decreased light touch foot and lateral lower leg as assessed today LLE Deficits / Details: WFL    Cervical / Trunk Assessment Cervical / Trunk Assessment: Kyphotic  Communication   Communication: No difficulties;Other (comment) (Pt with limited interactions)  Cognition Arousal/Alertness: Awake/alert;Lethargic Behavior During Therapy: Flat affect Overall Cognitive Status: Impaired/Different from baseline Area of Impairment: Attention;Following commands;Safety/judgement;Awareness;Problem solving;Orientation                 Orientation Level: Disoriented to;Time Current Attention Level: Sustained   Following Commands: Follows one step commands consistently;Follows one step commands with increased time Safety/Judgement: Decreased awareness of deficits;Decreased awareness of safety Awareness: Intellectual Problem Solving: Slow processing;Decreased initiation;Difficulty sequencing;Requires verbal cues;Requires tactile cues General Comments: Pt whimpering throughout session, stating throat and neck hurt. When PT asked pt what year it is, pt states "oh Lord honey, I don't know if it's 20 or 21". Slow processing and response time, laughs as response to multiple PT questions/commands      General Comments General comments (skin integrity, edema, etc.): BP 177/90    Exercises General Exercises -  Lower Extremity Quad Sets: AROM;Both;10 reps;Supine Hip Flexion/Marching: AROM;Both;Standing;Other (comment) (x3 reps bilatearlly)   Assessment/Plan    PT Assessment Patient needs continued PT services  PT Problem List Decreased strength;Decreased balance;Decreased mobility;Impaired sensation;Decreased knowledge of precautions;Decreased activity tolerance;Decreased safety awareness;Cardiopulmonary status limiting activity       PT Treatment Interventions Gait training;DME instruction;Functional mobility training;Therapeutic activities;Therapeutic exercise;Balance training;Patient/family education    PT Goals (Current goals can be found in the Care Plan section)  Acute Rehab PT Goals Patient Stated Goal: to feel better  PT Goal Formulation: With patient Time For Goal Achievement: 12/03/19 Potential to Achieve Goals: Good    Frequency Min 3X/week   Barriers to discharge Decreased caregiver support      Co-evaluation PT/OT/SLP Co-Evaluation/Treatment:  (dovetail with OT) Reason for Co-Treatment: For patient/therapist safety;To address functional/ADL transfers   OT goals addressed during session: ADL's and self-care       AM-PAC PT "6 Clicks" Mobility  Outcome Measure Help needed turning from your back to your side while in a flat bed without using bedrails?: A Little Help needed moving from lying on your back to sitting on the side of a flat bed without using bedrails?: A Lot Help needed moving to and from a bed to a chair (including a wheelchair)?: A Lot Help needed standing up from a chair using your arms (e.g., wheelchair or bedside chair)?: A Lot Help needed to walk in hospital room?: A Little Help needed climbing 3-5 steps with a railing? : A Lot 6 Click Score: 14    End of Session Equipment Utilized During Treatment: Gait belt;Cervical collar Activity Tolerance: Patient limited by pain Patient left: in bed;with call bell/phone within reach;with bed alarm set;with  nursing/sitter in room Nurse Communication: Mobility status PT  Visit Diagnosis: Unsteadiness on feet (R26.81);Muscle weakness (generalized) (M62.81);History of falling (Z91.81)    Time: 3154-0086 PT Time Calculation (min) (ACUTE ONLY): 20 min   Charges:   PT Evaluation $PT Re-evaluation: 1 Re-eval         Hazley Dezeeuw E, PT Acute Rehabilitation Services Pager [phone]  Office [phone]    Dakayla Disanti D Manasvi Dickard 11/12/2019, 10:17 AM

## 2019-11-12 NOTE — [place] FL2 (Signed)
[place] MEDICAID FL2 LEVEL OF CARE SCREENING TOOL     IDENTIFICATION  Patient Name: [name] Birthdate: Mar 07, 1945 Sex: female Admission Date (Current Location): 11/09/2019  [facility] and [place] Number:      Facility and Address:  [facility], [address] [place], [place] [address]      Provider Number: [id number]  Attending Physician Name and Address:  Maeola Harman, MD  Relative Name and Phone Number:  Tyler Aas (daughter) [phone]    Current Level of Care: Hospital Recommended Level of Care: Skilled Nursing Facility Prior Approval Number:    Date Approved/Denied:   PASRR Number: [id number]  Discharge Plan: SNF    Current Diagnoses: Patient Active Problem List   Diagnosis Date Noted  . Cervical stenosis of spine 11/10/2019  . Acute respiratory disease due to COVID-19 virus 05/23/2018  . NSTEMI (non-ST elevated myocardial infarction) (HCC) 11/05/2013  . Spinal stenosis of cervical region 10/20/2012  . Prolonged QT interval 10/18/2012  . Left sided numbness 10/18/2012  . Palpitations 10/18/2012  . 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 Numbness 06/20/2012  . Diabetes mellitus (HCC) 06/20/2012  . CVA (cerebral infarction) 06/20/2012  . Hypokalemia 06/20/2012  . ARTHRALGIA 01/28/2009  . CHEST PAIN UNSPECIFIED 11/26/2008  . DEPRESSION 09/04/2006  . Essential hypertension 09/04/2006  . GERD 09/04/2006  . MULTIPLE SITES, ARTHRITIS-ACUTE/CHRONIC 09/04/2006  . LOW BACK PAIN, CHRONIC 09/04/2006    Orientation RESPIRATION BLADDER Height & Weight     Self, Time, Place, Situation  Normal Continent, External catheter Weight: 154 lb 5.2 oz (70 kg) Height:  5\' 4"  (162.6 cm)  BEHAVIORAL SYMPTOMS/MOOD NEUROLOGICAL BOWEL NUTRITION STATUS      Continent Diet (see DC summary)  AMBULATORY STATUS COMMUNICATION OF NEEDS Skin   Limited Assist Verbally Other (Comment) (Closed incision in neck, closed system drain anterior neck  bulb (will likely be removed before DC))                       Personal Care Assistance Level of Assistance  Dressing, Feeding, Bathing Bathing Assistance: Limited assistance Feeding assistance: Independent Dressing Assistance: Limited assistance     Functional Limitations Info             SPECIAL CARE FACTORS FREQUENCY  PT (By licensed PT), OT (By licensed OT)     PT Frequency: 5x per week OT Frequency: 5x per week            Contractures      Additional Factors Info  Code Status, Allergies, Insulin Sliding Scale Code Status Info: FULL Allergies Info: Codeine, Ibuprofen, imdur (isosorbide Nitrate)   Insulin Sliding Scale Info: see DC summary       Current Medications (11/12/2019):  This is the current hospital active medication list Current Facility-Administered Medications  Medication Dose Route Frequency Provider Last Rate Last Admin  . 0.9 %  sodium chloride infusion  250 mL Intravenous Continuous 11/14/2019, MD 1 mL/hr at 11/11/19 2008 250 mL at 11/11/19 2008  . acetaminophen (TYLENOL) tablet 650 mg  650 mg Oral Q4H PRN 2009, MD       Or  . acetaminophen (TYLENOL) suppository 650 mg  650 mg Rectal Q4H PRN Maeola Harman, MD      . alum & mag hydroxide-simeth (MAALOX/MYLANTA) 200-200-20 MG/5ML suspension 30 mL  30 mL Oral Q6H PRN 07-31-2000, MD      . amLODipine (NORVASC) tablet 10 mg  10 mg Oral Daily Maeola Harman,  MD   10 mg at 11/12/19 0947  . bisacodyl (DULCOLAX) suppository 10 mg  10 mg Rectal Daily PRN Maeola Harman, MD      . chlorthalidone (HYGROTON) tablet 25 mg  25 mg Oral Daily Maeola Harman, MD   25 mg at 11/12/19 0945  . cloNIDine (CATAPRES) tablet 0.2 mg  0.2 mg Oral BID Maeola Harman, MD   0.2 mg at 11/12/19 0945  . dextrose 5 % and 0.45 % NaCl with KCl 20 mEq/L infusion   Intravenous Continuous Maeola Harman, MD      . docusate sodium (COLACE) capsule 100 mg  100 mg Oral BID Maeola Harman, MD   100 mg at 11/12/19 0947  .  gabapentin (NEURONTIN) capsule 100 mg  100 mg Oral TID Maeola Harman, MD   100 mg at 11/12/19 0947  . hydrALAZINE (APRESOLINE) tablet 50 mg  50 mg Oral BID Maeola Harman, MD   50 mg at 11/12/19 0947  . HYDROcodone-acetaminophen (NORCO/VICODIN) 5-325 MG per tablet 1 tablet  1 tablet Oral Q4H PRN Maeola Harman, MD      . HYDROmorphone (DILAUDID) injection 0.5 mg  0.5 mg Intravenous Q2H PRN Maeola Harman, MD   0.5 mg at 11/12/19 0322  . insulin aspart (novoLOG) injection 0-9 Units  0-9 Units Subcutaneous TID WC Maeola Harman, MD   3 Units at 11/12/19 1154  . lactated ringers infusion   Intravenous Continuous Maeola Harman, MD   Stopped at 11/11/19 1629  . losartan (COZAAR) tablet 50 mg  50 mg Oral Daily Maeola Harman, MD   50 mg at 11/12/19 0947  . menthol-cetylpyridinium (CEPACOL) lozenge 3 mg  1 lozenge Oral PRN Maeola Harman, MD       Or  . phenol [facility]) mouth spray 1 spray  1 spray Mouth/Throat PRN Maeola Harman, MD   1 spray at 11/11/19 2023  . metFORMIN (GLUCOPHAGE) tablet 750 mg  750 mg Oral BID WC Maeola Harman, MD   750 mg at 11/12/19 0947  . methocarbamol (ROBAXIN) tablet 500 mg  500 mg Oral Q6H PRN Maeola Harman, MD       Or  . methocarbamol (ROBAXIN) 500 mg in dextrose 5 % 50 mL IVPB  500 mg Intravenous Q6H PRN Maeola Harman, MD      . oxyCODONE (Oxy IR/ROXICODONE) immediate release tablet 10 mg  10 mg Oral Q3H PRN Maeola Harman, MD   10 mg at 11/12/19 0947  . polyethylene glycol (MIRALAX / GLYCOLAX) packet 17 g  17 g Oral Daily PRN Maeola Harman, MD      . potassium chloride (KLOR-CON) CR tablet 10 mEq  10 mEq Oral Daily Maeola Harman, MD   10 mEq at 11/12/19 0946  . rosuvastatin (CRESTOR) tablet 10 mg  10 mg Oral QHS Maeola Harman, MD   10 mg at 11/11/19 2120  . sodium chloride flush (NS) 0.9 % injection 3 mL  3 mL Intravenous Q12H Maeola Harman, MD   3 mL at 11/11/19 2202  . sodium chloride flush (NS) 0.9 % injection 3 mL  3 mL Intravenous PRN Maeola Harman, MD      . sodium  phosphate (FLEET) 7-19 GM/118ML enema 1 enema  1 enema Rectal Once PRN Maeola Harman, MD      . zolpidem [facility]) tablet 5 mg  5 mg Oral QHS PRN Maeola Harman, MD         Discharge Medications: Please see discharge summary for a list of discharge medications.  Relevant Imaging Results:  Relevant Lab Results:   Additional Information SSN: [id number] [place]a [place], [place] Work

## 2019-11-12 NOTE — TOC Initial Note (Addendum)
Transition of Care [facility]) - Initial/Assessment Note    Patient Details  Name: [name] MRN: [id number] Date of Birth: 08/10/1945  Transition of Care [facility]) CM/SW Contact:    Eduard Roux, LCSWA Phone Number: 11/12/2019, 2:37 PM  Clinical Narrative:                  CSW visit with patient at bedside. CSW introduced self and explained role. CSW discussed with patient PT recommendation of short term rehab at [facility]. Patient states she lives home alone. She has the support of her daughter but she works and would not be able to provide the level of care needed at this time. Patient states she is agreeable to [facility] at discharge. CSW explained the SNF process. Patient has not been vaccinated and declined vaccination. CSW explained the 14 day quarantine period required at [facility]. Patient states understating. No questions or concerns noted at this time.  CSW will provide bed offers once available.  CSW will continue to follow and assist with discharge planning.  Antony Blackbird, MSW, LCSWA Clinical Social Worker   Expected Discharge Plan: Skilled Nursing Facility Barriers to Discharge: Continued Medical Work up, English as a second language teacher, SNF Pending bed offer   Patient Goals and CMS Choice        Expected Discharge Plan and Services Expected Discharge Plan: Skilled Nursing Facility       Living arrangements for the past 2 months: Single Family Home                                      Prior Living Arrangements/Services Living arrangements for the past 2 months: Single Family Home Lives with:: Self                   Activities of Daily Living      Permission Sought/Granted Permission sought to share information with : Family Supports Permission granted to share information with : Yes, Verbal Permission Granted  Share Information with NAME: [facility]           Emotional Assessment              Admission diagnosis:  Paresthesias [R20.2] Cervical  stenosis of spine [M48.02] 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 Cervical stenosis of spine 11/10/2019  . Acute respiratory disease due to COVID-19 virus 05/23/2018  . NSTEMI (non-ST elevated myocardial infarction) (HCC) 11/05/2013  . Spinal stenosis of cervical region 10/20/2012  . Prolonged QT interval 10/18/2012  . Left sided numbness 10/18/2012  . Palpitations 10/18/2012  . 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 Numbness 06/20/2012  . Diabetes mellitus (HCC) 06/20/2012  . CVA (cerebral infarction) 06/20/2012  . Hypokalemia 06/20/2012  . ARTHRALGIA 01/28/2009  . CHEST PAIN UNSPECIFIED 11/26/2008  . DEPRESSION 09/04/2006  . Essential hypertension 09/04/2006  . GERD 09/04/2006  . MULTIPLE SITES, ARTHRITIS-ACUTE/CHRONIC 09/04/2006  . LOW BACK PAIN, CHRONIC 09/04/2006   PCP:  Patient, No Pcp Per Pharmacy:   [facility] [phone] Ginette Otto, [place] - [address] [place] [address] Phone: [phone] Fax: [phone]     Social Determinants of Health (SDOH) Interventions    Readmission Risk Interventions No flowsheet data found.

## 2019-11-12 NOTE — Progress Notes (Signed)
2100-Patients BP in the beginning of this nurses shift was 216/80s . This nurse gave patient scheduled antihypertensive medication.  2230- Patient BP went down to 174/88.  0350- This nurse noticed that patient BP was slightly increasing, with a blood pressure of 187/80. This nurse page neurosurgery services to see if MD on call wanted to address this.   600- Neurosurgery MD Oda Cogan, told this nurse that since patient has being trending this high to wait for dayshift doctors to address this and make a plan. This nurse will pass this information on to dayshift nurse.    Denver Faster, RN

## 2019-11-12 NOTE — Progress Notes (Addendum)
Subjective: Patient reports "My throat's just sore"  Objective: Vital signs in last 24 hours: Temp:  [97 F (36.1 C)-98.7 F (37.1 C)] 98.4 F (36.9 C) (10/21 0757) Pulse Rate:  [90-109] 93 (10/21 0757) Resp:  [11-20] 17 (10/21 0757) BP: (174-218)/(70-154) 192/74 (10/21 0757) SpO2:  [91 %-99 %] 97 % (10/21 0757) Weight:  [70 kg] 70 kg (10/20 1325)  Intake/Output from previous day: 10/20 0701 - 10/21 0700 In: 1206.9 [I.V.:1006.9; IV Piggyback:200] Out: 90 [Drains:40; Blood:50] Intake/Output this shift: Total I/O In: 117 [P.O.:117] Out: -   Awake, cooperative. Reports only sore throat at present. Improved strength BUE, hand intrinsics reamain weak bilaterally. Incision without erythema, swelling or drainage beneath honeycomb drsg. JP patent, 43ml overnight.  Lab Results: Recent Labs    11/09/19 2355  WBC 10.1  HGB 12.3  HCT 37.9  PLT 218   BMET Recent Labs    11/09/19 2355  NA 134*  K 3.5  CL 94*  CO2 28  GLUCOSE 173*  BUN 21  CREATININE 1.53*  CALCIUM 8.8*    Studies/Results: DG Cervical Spine 2-3 Views  Result Date: 11/11/2019 CLINICAL DATA:  Surgery, elective. Additional history provided: ACDF 3-7. EXAM: CERVICAL SPINE - 2-3 VIEW COMPARISON:  Radiographs of the cervical spine 11/10/2019. FINDINGS: Two intraoperative lateral view radiographs of the cervical spine are submitted. The initial image taken at 2:20 p.m. demonstrates metallic site markers projecting ventral to the C4 level. On the subsequent radiograph taken at 4:27 p.m., there has been interval C3-C7 ACDF. Superimposition of the patient's shoulders limits evaluation of the C6 and C7 levels on this radiograph. Curvilinear hyperdensity projects ventral to the C4-C6 vertebrae. Partially visualized life-support tubes. IMPRESSION: Two intraoperative lateral view radiographs of the cervical spine from C3-C7 ACDF as described. Curvilinear hyperdensity projects ventral to the C4-C6 vertebrae, likely reflecting  surgical a sponge or surgical packing material. Correlation with the procedural history is recommended. Electronically Signed   By: Jackey Loge DO   On: 11/11/2019 17:01   DG Cervical Spine With Flex & Extend  Result Date: 11/10/2019 CLINICAL DATA:  Cervical cord compression EXAM: CERVICAL SPINE COMPLETE WITH FLEXION AND EXTENSION VIEWS COMPARISON:  MRI 11/10/2019 FINDINGS: Seven view radiograph cervical spine. Odontoid view is partially obscured by overlying osseous structures, however, the lateral masses of C1 appear aligned with the body of C2. Oblique views fail to profiled the neural foramina bilaterally. There is straightening of the cervical spine. No acute fracture or listhesis of the cervical spine. Vertebral body height has been preserved. There is intervertebral disc space narrowing and endplate remodeling of C4-C7 in keeping with changes of advanced degenerative disc disease. There is preservation of remaining intervertebral disc heights. 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 Flexion and extension views demonstrate hypomobility at C4-C7. No abnormal angulatory or translatory motion. The prevertebral soft tissues are unremarkable. IMPRESSION: Advanced degenerative disc disease C4-C7 with relative hypomobility at this level. 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 Electronically Signed   By: Helyn Numbers MD   On: 11/10/2019 13:17    Assessment/Plan: stable  LOS: 2 days  Continue to work with PT/OT with expectation of SNF. Hard collar when out of bed or mobilizing   [name] 11/12/2019, 10:26 AM   Patient is doing well following 4 level ACDF.  Mobilize with therapies.  Drain to stay in place today.

## 2019-11-12 NOTE — Progress Notes (Signed)
Inpatient Diabetes Program Recommendations  AACE/ADA: New Consensus Statement on Inpatient Glycemic Control (2015)  Target Ranges:  Prepandial:   less than 140 mg/dL      Peak postprandial:   less than 180 mg/dL (1-2 hours)      Critically ill patients:  140 - 180 mg/dL   Lab Results  Component Value Date   GLUCAP 286 (H) 11/12/2019   HGBA1C 6.4 (H) 11/05/2013    Review of Glycemic Control Results for [name], [name] (MRN [id number]) as of 11/12/2019 09:42  Ref. Range 11/11/2019 17:09 11/11/2019 21:56 11/12/2019 00:08 11/12/2019 09:24  Glucose-Capillary Latest Ref Range: 70 - 99 mg/dL 865 (H) 784 (H) 696 (H) 286 (H)   Anterior cervical decompression/discectomy/fusion  Diabetes history: DM 2 Outpatient Diabetes medications: Metformin 750 mg bid Current orders for Inpatient glycemic control: Metformin 1000 mg bid  Inpatient Diabetes Program Recommendations:    Decadron 10 mg given yesterday  -  Increase Novolog Correction to "Moderate" 0-15 units tid + hs scale  -  Updated A1c level in process  May nee to add long acting insulin if trends continue to be elevated in 24 hours.  -  Will need close follow up outpatient for glucose control for wound healing  Thanks,  Christena Deem RN, MSN, BC-ADM Inpatient Diabetes Coordinator Team Pager [phone] (8a-5p)

## 2019-11-12 NOTE — Evaluation (Signed)
Occupational Therapy Evaluation Patient Details Name: [name] MRN: [id number] DOB: 1945-08-22 Today's Date: 11/12/2019    History of Present Illness Pt is a 74 y/o female admitted secondary to fall, weakness, and numbness in bilateral hands.  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 She underwent C3-4, C4-5, C5-6, C6-7 ACDF on 10/20 PMH includes CVA, HTN, and DM.    Clinical Impression   Pt admitted with above. She demonstrates the below listed deficits and will benefit from continued OT to maximize safety and independence with BADLs.  Pt presents to OT with generalized weakness, decreased activity tolerance, decreased bil. UE strength and ROM, impaired cognition, decreased awareness of cervical precautions.  She currently requires min A - max A for ADLs and min A +2 for safety with functional mobility.  She reports she lives alone and was independent PTA.  Recommend SNF level rehab at discharge.       Follow Up Recommendations  SNF    Equipment Recommendations  3 in 1 bedside commode    Recommendations for Other Services       Precautions / Restrictions Precautions Precautions: Fall;Cervical Precaution Booklet Issued: Yes (comment) Precaution Comments: Pt instructed in cervical precautions  Required Braces or Orthoses: Cervical Brace Cervical Brace: Hard collar;Other (comment) (off in bed, off to shower, and off to ambulate to BR )      Mobility Bed Mobility Overal bed mobility: Needs Assistance Bed Mobility: Rolling;Sidelying to Sit Rolling: Mod assist Sidelying to sit: Mod assist       General bed mobility comments: requires mod verbal cues for technique, assist to roll to right and assist to lift trunk     Transfers Overall transfer level: Needs assistance Equipment used: Rolling walker (2 wheeled) Transfers: Sit to/from [employer] Sit to Stand: Min assist Stand pivot transfers: Min  assist;+2 safety/equipment       General transfer comment: verbal cues for safety and hand placement and assist to steady     Balance Overall balance assessment: Needs assistance Sitting-balance support: Feet supported;Single extremity supported Sitting balance-Leahy Scale: Poor Sitting balance - Comments: pt requires UE support.  Pt occasionally lurches requiring min A to steady    Standing balance support: Bilateral upper extremity supported Standing balance-Leahy Scale: Poor Standing balance comment: requires UE support and min A                            ADL either performed or assessed with clinical judgement   ADL Overall ADL's : Needs assistance/impaired Eating/Feeding: Set up;Bed level   Grooming: Wash/dry hands;Wash/dry face;Sitting;Minimal assistance;Bed level   Upper Body Bathing: Maximal assistance;Sitting   Lower Body Bathing: Maximal assistance;Sit to/from stand Lower Body Bathing Details (indicate cue type and reason): unable to perform figure 4  Upper Body Dressing : Maximal assistance;Sitting   Lower Body Dressing: Maximal assistance;Sit to/from stand Lower Body Dressing Details (indicate cue type and reason): unable to perform figure 4  Toilet Transfer: Minimal assistance;+2 for safety/equipment;Ambulation;Comfort height toilet;Grab bars;RW Statistician Details (indicate cue type and reason): pt requires assist to steady and mod verbal cues for safety  Toileting- Clothing Manipulation and Hygiene: Moderate assistance;Sit to/from stand       Functional mobility during ADLs: Minimal assistance;+2 for safety/equipment;Rolling walker       Vision   Additional Comments: keeps eyes closed majority of session      Perception  Praxis      Pertinent Vitals/Pain Pain Assessment: 0-10 Pain Score: 5  Pain Location: Lt shoulder and throat  Pain Descriptors / Indicators: Aching;Tingling Pain Intervention(s): Monitored during session;Limited  activity within patient's tolerance;Repositioned     Hand Dominance Right   Extremity/Trunk Assessment Upper Extremity Assessment Upper Extremity Assessment: RUE deficits/detail;LUE deficits/detail RUE Deficits / Details: shoulders grossly 2-/5 - 2/5; elbows distally 4-/5  RUE Sensation: decreased light touch LUE Deficits / Details: shoulders grossly 2-/5 - 2/5; elbows distally 4-/5 LUE Sensation: decreased light touch   Lower Extremity Assessment Lower Extremity Assessment: Defer to PT evaluation   Cervical / Trunk Assessment Cervical / Trunk Assessment: Kyphotic   Communication Communication Communication: No difficulties;Other (comment) (Pt with limited interactions)   Cognition Arousal/Alertness: Awake/alert;Lethargic Behavior During Therapy: Flat affect Overall Cognitive Status: Impaired/Different from baseline Area of Impairment: Attention;Following commands;Safety/judgement;Awareness;Problem solving                   Current Attention Level: Sustained   Following Commands: Follows one step commands consistently;Follows one step commands with increased time Safety/Judgement: Decreased awareness of deficits;Decreased awareness of safety Awareness: Intellectual Problem Solving: Slow processing;Decreased initiation;Difficulty sequencing;Requires verbal cues;Requires tactile cues General Comments: Pt whimpers throughout session.  She is slow to respond and re   General Comments  BP 177/90    Exercises     Shoulder Instructions      Home Living Family/patient expects to be discharged to:: Private residence Living Arrangements: Alone Available Help at Discharge: Family;Available PRN/intermittently Type of Home: House Home Access: Stairs to enter [employer] of Steps: 1 Entrance Stairs-Rails: None Home Layout: One level     Bathroom Shower/Tub: Chief Strategy Officer: Standard     Home Equipment: Shower seat   Additional  Comments: Pt reports she sponge bathes "for a long time" due to fear of falling       Prior Functioning/Environment Level of Independence: Independent        Comments: Pt reports she does not drive, but is fully independent         OT Problem List: Decreased strength;Decreased range of motion;Decreased activity tolerance;Impaired balance (sitting and/or standing);Decreased coordination;Decreased cognition;Decreased safety awareness;Decreased knowledge of use of DME or AE;Decreased knowledge of precautions;Impaired UE functional use;Pain      OT Treatment/Interventions: Self-care/ADL training;DME and/or AE instruction;Therapeutic activities;Cognitive remediation/compensation;Patient/family education;Balance training    OT Goals(Current goals can be found in the care plan section) Acute Rehab OT Goals Patient Stated Goal: to feel better  OT Goal Formulation: With patient Time For Goal Achievement: 11/26/19 Potential to Achieve Goals: Good ADL Goals Pt Will Perform Grooming: (P) with min guard assist;standing Pt Will Perform Lower Body Bathing: (P) with min assist;sit to/from stand;with adaptive equipment Pt Will Perform Lower Body Dressing: (P) with min assist;with adaptive equipment;sit to/from stand Pt Will Transfer to Toilet: (P) with min guard assist;ambulating;regular height toilet;bedside commode;grab bars Pt Will Perform Toileting - Clothing Manipulation and hygiene: (P) with min assist;sit to/from stand Additional ADL Goal #1: (P) Pt will be independent with cervical precautions  OT Frequency: Min 2X/week   Barriers to D/C: Decreased caregiver support  pt lives alone        Co-evaluation PT/OT/SLP Co-Evaluation/Treatment: Yes Reason for Co-Treatment: For patient/therapist safety;To address functional/ADL transfers   OT goals addressed during session: ADL's and self-care      AM-PAC OT "6 Clicks" Daily Activity     Outcome Measure Help from another person eating  meals?: A Little  Help from another person taking care of personal grooming?: A Little Help from another person toileting, which includes using toliet, bedpan, or urinal?: A Lot Help from another person bathing (including washing, rinsing, drying)?: A Lot Help from another person to put on and taking off regular upper body clothing?: A Lot Help from another person to put on and taking off regular lower body clothing?: A Lot 6 Click Score: 14   End of Session Equipment Utilized During Treatment: Gait belt;Cervical collar Nurse Communication: Mobility status;Precautions  Activity Tolerance: Patient limited by fatigue;Patient limited by pain Patient left: Other (comment) (with PT )  OT Visit Diagnosis: Unsteadiness on feet (R26.81);Cognitive communication deficit (R41.841);Pain Pain - Right/Left: Left Pain - part of body: Shoulder (NECK )                Time: 7353-2992 OT Time Calculation (min): 28 min Charges:  OT General Charges $OT Visit: 1 Visit OT Evaluation $OT Eval Moderate Complexity: 1 Mod OT Treatments $Self Care/Home Management : 8-22 mins  Eber Jones., OTR/L Acute Rehabilitation Services Pager [phone] Office [phone]   Jeani Hawking M 11/12/2019, 9:29 AM

## 2019-11-13 LAB — SARS CORONAVIRUS 2 BY RT PCR (HOSPITAL ORDER, PERFORMED IN ~~LOC~~ HOSPITAL LAB): SARS Coronavirus 2: NEGATIVE

## 2019-11-13 LAB — GLUCOSE, CAPILLARY
Glucose-Capillary: 213 mg/dL — ABNORMAL HIGH (ref 70–99)
Glucose-Capillary: 167 mg/dL — ABNORMAL HIGH (ref 70–99)
Glucose-Capillary: 195 mg/dL — ABNORMAL HIGH (ref 70–99)
Glucose-Capillary: 155 mg/dL — ABNORMAL HIGH (ref 70–99)

## 2019-11-13 LAB — HEMOGLOBIN A1C
Hgb A1c MFr Bld: 9.2 % — ABNORMAL HIGH (ref 4.8–5.6)
Mean Plasma Glucose: 217 mg/dL

## 2019-11-13 NOTE — Care Management Important Message (Signed)
Important Message  Patient Details  Name: [name] MRN: [id number] Date of Birth: January 23, 1945   Medicare Important Message Given:  Yes     Cassity Christian Stefan Church 11/13/2019, 2:56 PM

## 2019-11-13 NOTE — Progress Notes (Signed)
Subjective: Patient reports doing well  Objective: Vital signs in last 24 hours: Temp:  [97.5 F (36.4 C)-98.5 F (36.9 C)] 97.6 F (36.4 C) (10/22 1149) Pulse Rate:  [87-98] 95 (10/22 1149) Resp:  [13-20] 15 (10/22 1149) BP: (100-142)/(47-75) 121/57 (10/22 1149) SpO2:  [85 %-99 %] 98 % (10/22 1149)  Intake/Output from previous day: 10/21 0701 - 10/22 0700 In: 617 [P.O.:617] Out: 10 [Drains:10] Intake/Output this shift: Total I/O In: 890 [P.O.:880; Other:10] Out: -   Physical Exam: Strength improved in upper and lower extremities.  Dressing CDI.  Drain discontinued.  Lab Results: No results for input(s): WBC, HGB, HCT, PLT in the last 72 hours. BMET No results for input(s): NA, K, CL, CO2, GLUCOSE, BUN, CREATININE, CALCIUM in the last 72 hours.  Studies/Results: DG Cervical Spine 2-3 Views  Result Date: 11/11/2019 CLINICAL DATA:  Surgery, elective. Additional history provided: ACDF 3-7. EXAM: CERVICAL SPINE - 2-3 VIEW COMPARISON:  Radiographs of the cervical spine 11/10/2019. FINDINGS: Two intraoperative lateral view radiographs of the cervical spine are submitted. The initial image taken at 2:20 p.m. demonstrates metallic site markers projecting ventral to the C4 level. On the subsequent radiograph taken at 4:27 p.m., there has been interval C3-C7 ACDF. Superimposition of the patient's shoulders limits evaluation of the C6 and C7 levels on this radiograph. Curvilinear hyperdensity projects ventral to the C4-C6 vertebrae. Partially visualized life-support tubes. IMPRESSION: Two intraoperative lateral view radiographs of the cervical spine from C3-C7 ACDF as described. Curvilinear hyperdensity projects ventral to the C4-C6 vertebrae, likely reflecting surgical a sponge or surgical packing material. Correlation with the procedural history is recommended. Electronically Signed   By: Jackey Loge DO   On: 11/11/2019 17:01    Assessment/Plan: Patient is doing well.  Will need SNF  placement.    LOS: 3 days    Dorian Heckle, MD 11/13/2019, 2:58 PM

## 2019-11-13 NOTE — Progress Notes (Signed)
Inpatient Diabetes Program Recommendations  AACE/ADA: New Consensus Statement on Inpatient Glycemic Control (2015)  Target Ranges:  Prepandial:   less than 140 mg/dL      Peak postprandial:   less than 180 mg/dL (1-2 hours)      Critically ill patients:  140 - 180 mg/dL   Lab Results  Component Value Date   GLUCAP 213 (H) 11/13/2019   HGBA1C 9.2 (H) 11/11/2019    Review of Glycemic Control Results for [name], [name] (MRN [id number]) as of 11/13/2019 10:44  Ref. Range 11/12/2019 09:24 11/12/2019 11:49 11/12/2019 15:34 11/12/2019 21:39 11/13/2019 09:01  Glucose-Capillary Latest Ref Range: 70 - 99 mg/dL 809 (H) 983 (H) 382 (H) 238 (H) 213 (H)   Glucose trends decreasing, however in the 200 range still  Anterior cervical decompression/discectomy/fusion  Diabetes history: DM 2 Outpatient Diabetes medications: Metformin 750 mg bid Current orders for Inpatient glycemic control: Metformin 1000 mg bid  Inpatient Diabetes Program Recommendations:    Decadron 10 mg given during surgery  -  Add Novolog bedtime scale  -  Consider Glipizide 5 mg Daily  -  Updated A1c level 9.2% on 10/20  -  Will need close follow up outpatient for glucose control for wound healing. May need additional medication therapy for d/c.  Thanks,  Christena Deem RN, MSN, BC-ADM Inpatient Diabetes Coordinator Team Pager [phone] (8a-5p)

## 2019-11-13 NOTE — Care Management Important Message (Signed)
Important Message  Patient Details  Name: [name] MRN: [id number] Date of Birth: August 28, 1945   Medicare Important Message Given:  Yes     Dayna Alia Stefan Church 11/13/2019, 2:58 PM

## 2019-11-13 NOTE — TOC Progression Note (Signed)
Transition of Care [facility]) - Progression Note    Patient Details  Name: [name] MRN: [id number] Date of Birth: 08-27-1945  [facility] of Care [facility] CM/SW Contact  Eduard Roux, [place] Phone Number: 11/13/2019, 8:55 PM  Clinical Narrative:     [name] confirmed bed but does not admit patients over the weekend. CSW informed patient,therefore she selected [address] confirmed availability but..Per [facility]- patient has open claim against/pending with her insurance , that is impacting her admittance to SNF. Patient will need to call her insurance and have this claim closed. Probably will not get resolved until Monday.  Antony Blackbird, MSW, LCSWA Clinical Social Worker   Expected Discharge Plan: Skilled Nursing Facility Barriers to Discharge: Continued Medical Work up, English as a second language teacher, SNF Pending bed offer  Expected Discharge Plan and Services Expected Discharge Plan: Skilled Nursing Facility       Living arrangements for the past 2 months: Single Family Home                                       Social Determinants of Health (SDOH) Interventions    Readmission Risk Interventions No flowsheet data found.

## 2019-11-13 NOTE — TOC Progression Note (Signed)
Transition of Care [facility]) - Progression Note    Patient Details  Name: [name] MRN: [id number] Date of Birth: [birth year]  Transition of Care [facility]) CM/SW Contact  Eduard Roux, [place] Phone Number: 11/13/2019, 11:49 AM  Clinical Narrative:     Provided patient with bed offers . Patient has selected [facility].  CSW contacted [facility] to confirm availabilty-waiting on response.   CSW started Building surveyor # [id number]  RN updated and covid test requested   Antony Blackbird, MSW, LCSWA Clinical Social Worker   Expected Discharge Plan: Skilled Nursing Facility Barriers to Discharge: Continued Medical Work up, English as a second language teacher, SNF Pending bed offer  Expected Discharge Plan and Services Expected Discharge Plan: Skilled Nursing Facility       Living arrangements for the past 2 months: Single Family Home                                       Social Determinants of Health (SDOH) Interventions    Readmission Risk Interventions No flowsheet data found.

## 2019-11-14 LAB — GLUCOSE, CAPILLARY
Glucose-Capillary: 206 mg/dL — ABNORMAL HIGH (ref 70–99)
Glucose-Capillary: 221 mg/dL — ABNORMAL HIGH (ref 70–99)
Glucose-Capillary: 161 mg/dL — ABNORMAL HIGH (ref 70–99)
Glucose-Capillary: 184 mg/dL — ABNORMAL HIGH (ref 70–99)

## 2019-11-14 NOTE — Progress Notes (Signed)
At the beginning of the shift prior to current RN receiving report, bed alarm was going off and oncoming RN found patient on side of the bed with leads and pulse ox off stating that she needed to go to the bathroom. Patient was attempting to go unassisted but was asked by oncoming RN to wait for assistance as activity level had not been reported yet and not other staff was present in the room. Patient insisted that she goes to the bathroom all the time with the walker and proceeded to stand. RN encouraged patient to stand tall, make sure she was steady and ready and then with the assistance of the walker and RN could proceed to bathroom. The patient stood and with the walker and RN assisting, took one step then sat on the floor. The patient did not plop but slowly sat on the floor. RN assisted with easing to floor to prevent injury. RN called for help and two RNs assisted with standing and put on the [facility] to use the bathroom. Vitals and assessment performed. Patient states no injuries and no new pain just her constant leg pain. Message sent to Dr. Venetia Maxon and awaiting response. Daughter Freddi Starr also notified. CN also notified. Bed alarm audible and activated. Will continue to monitor.

## 2019-11-14 NOTE — Progress Notes (Signed)
NEUROSURGERY PROGRESS NOTE  Doing well. Complains of appropriate neck soreness. No arm pain No numbness, tingling or weakness Ambulating and voiding well Good strength and sensation Incision CDI  Temp:  [97.6 F (36.4 C)-99 F (37.2 C)] 98.2 F (36.8 C) (10/23 0823) Pulse Rate:  [76-100] 88 (10/23 0823) Resp:  [12-21] 12 (10/23 0823) BP: (113-160)/(45-97) 152/55 (10/23 0823) SpO2:  [94 %-100 %] 96 % (10/23 5361)  Plan: Awaiting SNF placement, likely not until Monday. Continue therapies today.   Sherryl Manges, NP 11/14/2019 9:40 AM

## 2019-11-15 LAB — GLUCOSE, CAPILLARY
Glucose-Capillary: 298 mg/dL — ABNORMAL HIGH (ref 70–99)
Glucose-Capillary: 109 mg/dL — ABNORMAL HIGH (ref 70–99)
Glucose-Capillary: 181 mg/dL — ABNORMAL HIGH (ref 70–99)
Glucose-Capillary: 177 mg/dL — ABNORMAL HIGH (ref 70–99)

## 2019-11-15 NOTE — Progress Notes (Signed)
Patient ID: [name], female   DOB: 22-May-1945, 74 y.o.   MRN: [id number] Seems to be doing well.  She has some neck pain.  She complains of some numbness in the hands but seems to be moving everything well.  Incision is clean dry and intact.  Overall she is stable.  Continue therapy.  Awaiting CIR

## 2019-11-15 NOTE — Progress Notes (Signed)
Patient continues to constantly complain of pain in her throat requiring po pain meds. Patient is refusing cephacol spray and occasionally uses lozengers but states she needs something stronger. Patient noted to be in severe pain when taking crushed meds in applesauce but does not express the same nonverbal cues of pain when eating meals. Patient moans and groans whenever staff is in the room but when no staff is present, staff can hear patient watching tv and interacting with the programs without moans and groans. RN has sent secure chat to MD about pain but no new orders. Will continue to monitor. Note: it is the patient's request to have meds crushed and put in applesauce and refuses pudding.

## 2019-11-16 LAB — GLUCOSE, CAPILLARY
Glucose-Capillary: 180 mg/dL — ABNORMAL HIGH (ref 70–99)
Glucose-Capillary: 208 mg/dL — ABNORMAL HIGH (ref 70–99)
Glucose-Capillary: 153 mg/dL — ABNORMAL HIGH (ref 70–99)
Glucose-Capillary: 197 mg/dL — ABNORMAL HIGH (ref 70–99)

## 2019-11-16 NOTE — TOC Progression Note (Addendum)
Transition of Care [facility]) - Progression Note    Patient Details  Name: Angee Gupton MRN: [id number] Date of Birth: 07/20/1945  Transition of Care [facility]) CM/SW Contact  Eduard Roux, [place] Phone Number: 11/16/2019, 5:22 PM  Clinical Narrative:     CSW visit with patient provided update- no bed offers. CSW encouraged the patient to continue to work with PT/OT as much as possible because not sure if will be able to secure placement at SNF. Patient states understanding. Patient gave CSW permission to call her daughter.  CSW contacted patient's daughter and requested her  assistance with contacting attorneys to have open claims closed. CSW advised she has been declined SNF bed offers. Patient daughter states she works during the day but will try to make requested phone calls. CSW provided names, number and incident time frames to provide during phone call. She shared she works at [employer]- CSW advised her to see if she can get assistance from  SNF , since they provided CSW with information and was willing to help. CSW requested patient's daughter follow up with CSW tomorrow.  CSW will continue to follow and assist with discharge planning.   Antony Blackbird, MSW, LCSWA Clinical Social Worker   Expected Discharge Plan: Skilled Nursing Facility Barriers to Discharge: Continued Medical Work up, English as a second language teacher, SNF Pending bed offer  Expected Discharge Plan and Services Expected Discharge Plan: Skilled Nursing Facility       Living arrangements for the past 2 months: Single Family Home                                       Social Determinants of Health (SDOH) Interventions    Readmission Risk Interventions No flowsheet data found.

## 2019-11-16 NOTE — TOC Progression Note (Addendum)
Transition of Care [facility]) - Progression Note    Patient Details  Name: [name] MRN: [id number] Date of Birth: May 13, 1945  Transition of Care [facility]) CM/SW [place], [place] Phone Number: 11/16/2019, 1:05 PM  Clinical Narrative:     CSW met with the patient and was given permission to call medicare([id number]) regarding open 3 open claims,02/2017 w/ [employer],04/2017 w/[employer] and 03/2017-no fault with progressive insurance( call placed on speaker so that the patient could hear). Per Medicare these open claims must be closed by the attorney since the patient can not recall her last date of treatment. Medicare advised it is up to the SNF to accept patient with open claims. If claim is not related they can indicate not related when processing claim.   CSW spoke with [facility]- they are unable to accept patient with open claims because it is possible Medicare will not cover the cost of SNF if claims is related to hospital stay.  CSW explain to patient and advised [facility] has declined- patient was agreeable to [facility], if they are willing to make bed offer. CSW waiting on response.  Thurmond Butts, MSW, [place] Clinical Social Worker   Expected Discharge Plan: Skilled Nursing Facility Barriers to Discharge: Continued Medical Work up, Ship broker, SNF Pending bed offer  Expected Discharge Plan and Services Expected Discharge Plan: [place] arrangements for the past 2 months: Single Family Home                                       Social Determinants of Health (SDOH) Interventions    Readmission Risk Interventions No flowsheet data found.

## 2019-11-16 NOTE — Progress Notes (Addendum)
Subjective: Patient reports that's she is doing well overall. Still has complaints of some throat discomfort.   Objective: Vital signs in last 24 hours: Temp:  [97.4 F (36.3 C)-97.8 F (36.6 C)] 97.8 F (36.6 C) (10/25 0735) Pulse Rate:  [74-93] 74 (10/25 0735) Resp:  [12-20] 12 (10/25 0735) BP: (123-142)/(49-66) 142/57 (10/25 0735) SpO2:  [96 %-99 %] 99 % (10/25 0735)  Intake/Output from previous day: 10/24 0701 - 10/25 0700 In: 480 [P.O.:480] Out: -  Intake/Output this shift: No intake/output data recorded.  Physical Exam: Awake, alert, and conversant. Patient is doing well. MAEW with good power. Doing well. Incisions CDI.  Lab Results: No results for input(s): WBC, HGB, HCT, PLT in the last 72 hours. BMET No results for input(s): NA, K, CL, CO2, GLUCOSE, BUN, CREATININE, CALCIUM in the last 72 hours.  Studies/Results: No results found.  Assessment/Plan: Patient is doing well. Continue working on pain control, mobility and ambulation.  Awaiting SNF placement.    LOS: 6 days     Council Mechanic, DNP, NP-C 11/16/2019, 9:34 AM    Awaiting SNF placement.

## 2019-11-16 NOTE — Progress Notes (Signed)
Physical Therapy Treatment Patient Details Name: [name] MRN: [id number] DOB: [birth year] Today's Date: 11/16/2019    History of Present Illness Pt is a 74 y/o female admitted secondary to fall, weakness, and numbness in bilateral hands.  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 She underwent C3-4, C4-5, C5-6, C6-7 ACDF on 10/20 PMH includes CVA, HTN, and DM.     PT Comments    Pt initially lethargic, but wanting to use the bathroom.  She needed encouragement to push herself further to ambulate in the halls.  Emphasis on rolling, transitions, sit to stand from bed/toilet, and ambulation in the halls with RW.  General cervical education/safety with transfers, bed mobility and gait as well as the importance of posture and bracing.     Follow Up Recommendations  SNF     Equipment Recommendations  Other (comment) (TBA)    Recommendations for Other Services       Precautions / Restrictions Precautions Precautions: Fall;Cervical Precaution Booklet Issued: Yes (comment) Precaution Comments: Pt instructed in cervical precautions; BLT rules, log roll in and out of bed Required Braces or Orthoses: Cervical Brace Cervical Brace: Hard collar;Other (comment)    Mobility  Bed Mobility Overal bed mobility: Needs Assistance Bed Mobility: Rolling;Sidelying to Sit Rolling: Mod assist Sidelying to sit: Mod assist;+2 for physical assistance       General bed mobility comments: cues for technique, assist for hook lying, truncal assist to roll and transition R side to sitting  Transfers Overall transfer level: Needs assistance   Transfers: Sit to/from Stand Sit to Stand: Min assist;+2 safety/equipment         General transfer comment: cues for hand placement, boost and forward assist  Ambulation/Gait Ambulation/Gait assistance: Min assist;+2 safety/equipment Gait Distance (Feet): 95 Feet Assistive device: Rolling  walker (2 wheeled) Gait Pattern/deviations: Step-through pattern Gait velocity: decr Gait velocity interpretation: <1.8 ft/sec, indicate of risk for recurrent falls General Gait Details: cues for staying closer to the RW, posture.  Steadying assist and assist with maneuvering the RW   Stairs             Wheelchair Mobility    Modified Rankin (Stroke Patients Only)       Balance Overall balance assessment: Needs assistance   Sitting balance-Leahy Scale: Fair     Standing balance support: Bilateral upper extremity supported Standing balance-Leahy Scale: Poor Standing balance comment: reliant of at least 1 UE and occasional external support.  base on washing hands at the sink                            Cognition Arousal/Alertness: Awake/alert;Lethargic Behavior During Therapy: Flat affect;WFL for tasks assessed/performed Overall Cognitive Status: Impaired/Different from baseline Area of Impairment: Attention;Following commands;Safety/judgement;Awareness;Problem solving;Orientation                   Current Attention Level: Sustained   Following Commands: Follows one step commands consistently;Follows one step commands with increased time Safety/Judgement: Decreased awareness of deficits;Decreased awareness of safety Awareness: Intellectual Problem Solving: Slow processing;Difficulty sequencing;Requires verbal cues;Requires tactile cues        Exercises      General Comments        Pertinent Vitals/Pain Pain Assessment: Faces Faces Pain Scale: Hurts even more Pain Location: L SHOULDER AND NECK Pain Descriptors / Indicators: Aching;Tingling;Sore Pain Intervention(s): Monitored during session    Home Living  Prior Function            PT Goals (current goals can now be found in the care plan section) Acute Rehab PT Goals Patient Stated Goal: to feel better  PT Goal Formulation: With patient Time For Goal  Achievement: 12/03/19 Potential to Achieve Goals: Good Progress towards PT goals: Progressing toward goals    Frequency    Min 3X/week      PT Plan Current plan remains appropriate    Co-evaluation PT/OT/SLP Co-Evaluation/Treatment: Yes Reason for Co-Treatment: For patient/therapist safety;Complexity of the patient's impairments (multi-system involvement) PT goals addressed during session: Mobility/safety with mobility OT goals addressed during session: ADL's and self-care      AM-PAC PT "6 Clicks" Mobility   Outcome Measure  Help needed turning from your back to your side while in a flat bed without using bedrails?: A Little Help needed moving from lying on your back to sitting on the side of a flat bed without using bedrails?: A Lot Help needed moving to and from a bed to a chair (including a wheelchair)?: A Lot Help needed standing up from a chair using your arms (e.g., wheelchair or bedside chair)?: A Lot Help needed to walk in hospital room?: A Little Help needed climbing 3-5 steps with a railing? : A Lot 6 Click Score: 14    End of Session Equipment Utilized During Treatment: Cervical collar Activity Tolerance: Patient tolerated treatment well;Patient limited by pain Patient left: in chair;with call bell/phone within reach;with chair alarm set Nurse Communication: Mobility status PT Visit Diagnosis: Unsteadiness on feet (R26.81);Muscle weakness (generalized) (M62.81);History of falling (Z91.81)     Time: 5993-5701 PT Time Calculation (min) (ACUTE ONLY): 23 min  Charges:  $Gait Training: 8-22 mins                     11/16/2019  Jacinto Halim., PT Acute Rehabilitation Services [phone]  (pager) [phone]  (office)   Eliseo Gum Omair Dettmer 11/16/2019, 4:39 PM

## 2019-11-16 NOTE — Progress Notes (Signed)
Occupational Therapy Treatment Patient Details Name: [name] MRN: [id number] DOB: 1945/07/13 Today's Date: 11/16/2019    History of present illness Pt is a 74 y/o female admitted secondary to fall, weakness, and numbness in bilateral hands.  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 She underwent C3-4, C4-5, C5-6, C6-7 ACDF on 10/20 PMH includes CVA, HTN, and DM.    OT comments  Pt with some limitations due to pain, but is agreeable to therapy session. Pt performing room and short distance mobility in hallway using RW, fluctuating levels of assist (minA-minA+2 throughout), with x1 instance of LE buckle requiring minA to correct. Pt performing ADL tasks with up to modA today. Feel SNF recommendation remains appropriate. Will continue to follow while acutely admitted.   Follow Up Recommendations  SNF    Equipment Recommendations  3 in 1 bedside commode          Precautions / Restrictions Precautions Precautions: Fall;Cervical Precaution Booklet Issued: Yes (comment) Precaution Comments: Pt instructed in cervical precautions; BLT rules, log roll in and out of bed Required Braces or Orthoses: Cervical Brace Cervical Brace: Hard collar;Other (comment) Restrictions Weight Bearing Restrictions: No       Mobility Bed Mobility Overal bed mobility: Needs Assistance Bed Mobility: Rolling;Sidelying to Sit Rolling: Mod assist Sidelying to sit: Mod assist;+2 for physical assistance       General bed mobility comments: cues for technique, assist for hook lying, truncal assist to roll and transition R side to sitting  Transfers Overall transfer level: Needs assistance   Transfers: Sit to/from Stand Sit to Stand: Min assist;+2 safety/equipment         General transfer comment: cues for hand placement, boost and forward assist    Balance Overall balance assessment: Needs assistance   Sitting balance-Leahy Scale:  Fair     Standing balance support: Bilateral upper extremity supported Standing balance-Leahy Scale: Poor Standing balance comment: reliant of at least 1 UE and occasional external support.  base on washing hands at the sink                           ADL either performed or assessed with clinical judgement   ADL Overall ADL's : Needs assistance/impaired     Grooming: Minimal assistance;Standing;Cueing for compensatory techniques;Cueing for safety;Wash/dry hands Grooming Details (indicate cue type and reason): assist for balance                 Toilet Transfer: Minimal assistance;+2 for safety/equipment;Ambulation;RW;BSC Toilet Transfer Details (indicate cue type and reason): BSC over toilet Toileting- Clothing Manipulation and Hygiene: Moderate assistance;+2 for safety/equipment;Sitting/lateral lean;Sit to/from stand Toileting - Clothing Manipulation Details (indicate cue type and reason): assist for balance while standing and to assist with clothing management      Functional mobility during ADLs: Minimal assistance;+2 for safety/equipment;+2 for physical assistance;Rolling walker                         Cognition Arousal/Alertness: Awake/alert;Lethargic Behavior During Therapy: Flat affect;WFL for tasks assessed/performed Overall Cognitive Status: Impaired/Different from baseline Area of Impairment: Attention;Following commands;Safety/judgement;Awareness;Problem solving;Orientation                   Current Attention Level: Sustained   Following Commands: Follows one step commands consistently;Follows one step commands with increased time Safety/Judgement: Decreased awareness of deficits;Decreased awareness of safety Awareness: Intellectual Problem Solving: Slow  processing;Difficulty sequencing;Requires verbal cues;Requires tactile cues          Exercises     Shoulder Instructions       General Comments      Pertinent Vitals/  Pain       Pain Assessment: Faces Faces Pain Scale: Hurts even more Pain Location: L SHOULDER AND NECK Pain Descriptors / Indicators: Aching;Tingling;Sore Pain Intervention(s): Limited activity within patient's tolerance;Monitored during session;Patient requesting pain meds-RN notified  Home Living                                          Prior Functioning/Environment              Frequency  Min 2X/week        Progress Toward Goals  OT Goals(current goals can now be found in the care plan section)  Progress towards OT goals: Progressing toward goals  Acute Rehab OT Goals Patient Stated Goal: to feel better  OT Goal Formulation: With patient Time For Goal Achievement: 11/26/19 Potential to Achieve Goals: Good  Plan Discharge plan remains appropriate    Co-evaluation    PT/OT/SLP Co-Evaluation/Treatment: Yes Reason for Co-Treatment: For patient/therapist safety;To address functional/ADL transfers;Complexity of the patient's impairments (multi-system involvement) PT goals addressed during session: Mobility/safety with mobility OT goals addressed during session: ADL's and self-care      AM-PAC OT "6 Clicks" Daily Activity     Outcome Measure   Help from another person eating meals?: A Little Help from another person taking care of personal grooming?: A Little Help from another person toileting, which includes using toliet, bedpan, or urinal?: A Lot Help from another person bathing (including washing, rinsing, drying)?: A Lot Help from another person to put on and taking off regular upper body clothing?: A Lot Help from another person to put on and taking off regular lower body clothing?: A Lot 6 Click Score: 14    End of Session Equipment Utilized During Treatment: Gait belt;Rolling walker;Cervical collar  OT Visit Diagnosis: Unsteadiness on feet (R26.81);Cognitive communication deficit (R41.841);Pain Pain - Right/Left: Left Pain - part of  body: Shoulder (neck)   Activity Tolerance Patient tolerated treatment well;Patient limited by pain   Patient Left in chair;with call bell/phone within reach;with chair alarm set   Nurse Communication Mobility status        Time: 3662-9476 OT Time Calculation (min): 23 min  Charges: OT General Charges $OT Visit: 1 Visit OT Treatments $Self Care/Home Management : 8-22 mins  [name], OT Acute Rehabilitation Services Pager [phone] Office [phone]    Orlando Penner 11/16/2019, 5:55 PM

## 2019-11-16 NOTE — TOC Progression Note (Signed)
Transition of Care [facility]) - Progression Note    Patient Details  Name: [name] MRN: [id number] Date of Birth: 1945/11/29  [facility]) CM/SW Contact  Eduard Roux, [place] Phone Number: 11/16/2019, 3:07 PM  Clinical Narrative:     Patient has no bed offers (see previous note) CSW canceled insurance authorization request with [employer].   CSW will continue to assist with discharge planning.  Antony Blackbird, MSW, LCSWA Clinical Social Worker   Expected Discharge Plan: Skilled Nursing Facility Barriers to Discharge: Continued Medical Work up, English as a second language teacher, SNF Pending bed offer  Expected Discharge Plan and Services Expected Discharge Plan: Skilled Nursing Facility       Living arrangements for the past 2 months: Single Family Home                                       Social Determinants of Health (SDOH) Interventions    Readmission Risk Interventions No flowsheet data found.

## 2019-11-17 LAB — GLUCOSE, CAPILLARY
Glucose-Capillary: 203 mg/dL — ABNORMAL HIGH (ref 70–99)
Glucose-Capillary: 208 mg/dL — ABNORMAL HIGH (ref 70–99)
Glucose-Capillary: 191 mg/dL — ABNORMAL HIGH (ref 70–99)
Glucose-Capillary: 148 mg/dL — ABNORMAL HIGH (ref 70–99)

## 2019-11-17 MED ORDER — ONDANSETRON HCL 4 MG/2ML IJ SOLN
4.0000 mg | Freq: Four times a day (QID) | INTRAMUSCULAR | Status: DC
  Filled 2019-11-17: qty 2

## 2019-11-17 MED ORDER — ONDANSETRON HCL 4 MG/2ML IJ SOLN
4.0000 mg | Freq: Four times a day (QID) | INTRAMUSCULAR | Status: DC | PRN
  Administered 2019-11-17 – 2019-11-25 (×7): 4 mg via INTRAVENOUS
  Filled 2019-11-17 (×5): qty 2

## 2019-11-17 NOTE — TOC Progression Note (Signed)
Transition of Care [facility]) - Progression Note    Patient Details  Name: [name] MRN: [id number] Date of Birth: 05/03/1945  Transition of Care [facility]) CM/SW Contact  Eduard Roux, [place] Phone Number: 11/17/2019, 3:44 PM  Clinical Narrative:     CSW called patient's daughter- voice mail is full, unable to leave voice message.   Antony Blackbird, MSW, LCSWA Clinical Social Worker   Expected Discharge Plan: Skilled Nursing Facility Barriers to Discharge: Continued Medical Work up, English as a second language teacher, SNF Pending bed offer  Expected Discharge Plan and Services Expected Discharge Plan: Skilled Nursing Facility       Living arrangements for the past 2 months: Single Family Home                                       Social Determinants of Health (SDOH) Interventions    Readmission Risk Interventions No flowsheet data found.

## 2019-11-17 NOTE — Progress Notes (Addendum)
Subjective: Patient reports "I'm just not feeling good today. They keep sending me eggs and I've told them I don't eat eggs"  Objective: Vital signs in last 24 hours: Temp:  [97.8 F (36.6 C)-98.5 F (36.9 C)] 97.9 F (36.6 C) (10/26 0829) Pulse Rate:  [70-96] 95 (10/26 0829) Resp:  [15-23] 18 (10/26 0829) BP: (135-181)/(55-88) 181/65 (10/26 0943) SpO2:  [96 %-99 %] 97 % (10/26 0829)  Intake/Output from previous day: 10/25 0701 - 10/26 0700 In: 240 [P.O.:240] Out: -  Intake/Output this shift: No intake/output data recorded.  Alert, conversant. Frowning, complaining that she doesn't get along with some staff.  She denies pain other than posterior cervical soreness. Strength is improved in all extremities from pre-op. Cervical incision is without erythema, swelling or drainage beneath honeycomb and Dermabond.   Lab Results: No results for input(s): WBC, HGB, HCT, PLT in the last 72 hours. BMET No results for input(s): NA, K, CL, CO2, GLUCOSE, BUN, CREATININE, CALCIUM in the last 72 hours.  Studies/Results: No results found.  Assessment/Plan: improving  LOS: 7 days  Continue to work with therapies while awaiting SNF placement.    Georgiann Cocker 11/17/2019, 10:59 AM   Patient is continuing to make progress

## 2019-11-17 NOTE — Progress Notes (Signed)
Inpatient Diabetes Program Recommendations  AACE/ADA: New Consensus Statement on Inpatient Glycemic Control (2015)  Target Ranges:  Prepandial:   less than 140 mg/dL      Peak postprandial:   less than 180 mg/dL (1-2 hours)      Critically ill patients:  140 - 180 mg/dL   Lab Results  Component Value Date   GLUCAP 208 (H) 11/17/2019   HGBA1C 9.2 (H) 11/11/2019    Review of Glycemic Control Results for NAVAEH, KEHRES (MRN [id number]) as of 11/17/2019 11:42  Ref. Range 11/16/2019 21:45 11/17/2019 08:27 11/17/2019 11:21  Glucose-Capillary Latest Ref Range: 70 - 99 mg/dL 675 (H) 916 (H) 384 (H)  Anterior cervical decompression/discectomy/fusion  Diabetes history: DM 2 Outpatient Diabetes medications: Metformin 750 mg bid Current orders for Inpatient glycemic control: Metformin 1000 mg bid  Inpatient Diabetes Program Recommendations:    -  Consider Glipizide 5 mg Daily -  Will need close follow up outpatient for glucose control for wound healing. May need additional medication therapy for d/c.  Thanks, Lujean Rave, MSN, RNC-OB Diabetes Coordinator [phone] (8a-5p)

## 2019-11-17 NOTE — Progress Notes (Signed)
Patient reports pain a 10/10 all night.  Medication is given and reassessment is done and there is no change in pain level.  RN asked patient if she still wants the pain medication that she consistently asks for if she is not getting any relief.  Patient states, "it works for a little bit." but when RN reassess the pain level is unchanged.  RN reeducated patient on pain scale and how to properly rank pain.  Pt states pain is still a 10/10.

## 2019-11-18 LAB — GLUCOSE, CAPILLARY
Glucose-Capillary: 196 mg/dL — ABNORMAL HIGH (ref 70–99)
Glucose-Capillary: 211 mg/dL — ABNORMAL HIGH (ref 70–99)
Glucose-Capillary: 151 mg/dL — ABNORMAL HIGH (ref 70–99)
Glucose-Capillary: 197 mg/dL — ABNORMAL HIGH (ref 70–99)

## 2019-11-18 MED ORDER — INFLUENZA VAC A&B SA ADJ QUAD 0.5 ML IM PRSY
0.5000 mL | PREFILLED_SYRINGE | INTRAMUSCULAR | Status: AC
  Administered 2019-11-21: 0.5 mL via INTRAMUSCULAR
  Filled 2019-11-18: qty 0.5

## 2019-11-18 NOTE — Progress Notes (Addendum)
Occupational Therapy Treatment Patient Details Name: [name] MRN: [id number] DOB: [birth year] Today's Date: 11/18/2019    History of present illness Pt is a 74 y/o female admitted secondary to fall, weakness, and numbness in bilateral hands.  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 She underwent C3-4, C4-5, C5-6, C6-7 ACDF on 10/20 PMH includes CVA, HTN, and DM.    OT comments  Pt slowly progressing towards acute OT goals. Focus of session was grooming task standing at sink, functional transfers and household distance mobility. Pt continues to need cues for technique with rw, min A at times to steady, mod A with bed mobility. Ideally, recommend pt d/c to SNF. If pt ends up d/c home then will need to maximize [facility] services.    Follow Up Recommendations  Supervision/Assistance - 24 hour;Home health OT;Other (comment);SNF (if going home then recommend maximizing HH services)    Equipment Recommendations  3 in 1 bedside commode    Recommendations for Other Services      Precautions / Restrictions Precautions Precautions: Fall;Cervical Precaution Booklet Issued: Yes (comment) Required Braces or Orthoses: Cervical Brace Cervical Brace: Hard collar;Other (comment) (off in bed, off to shower, and off to ambulate to BR ) Restrictions Weight Bearing Restrictions: No       Mobility Bed Mobility Overal bed mobility: Needs Assistance Bed Mobility: Rolling;Sidelying to Sit Rolling: Mod assist Sidelying to sit: Mod assist       General bed mobility comments: cues for technique, assist to powerup trunk and maintain precautions during transitional movements  Transfers Overall transfer level: Needs assistance Equipment used: Rolling walker (2 wheeled) Transfers: Sit to/from Stand Sit to Stand: Min assist;+2 safety/equipment         General transfer comment: cues for technique with rw including hand placement. Min A to  steady from EOB, BSC, anc recliner.     Balance Overall balance assessment: Needs assistance Sitting-balance support: Feet supported;Single extremity supported Sitting balance-Leahy Scale: Fair     Standing balance support: Single extremity supported;Bilateral upper extremity supported;During functional activity Standing balance-Leahy Scale: Poor Standing balance comment: able to brush teeth in standing with single extremity support. BUE support for walking                           ADL either performed or assessed with clinical judgement   ADL Overall ADL's : Needs assistance/impaired     Grooming: Min guard;Oral care;Standing;Set up Grooming Details (indicate cue type and reason): educated on 2 cup technique to avoid bending at neck. cues for technique with balance and to utilize support of sink. single extremity support needed in static standing so setup assist provided for bilateral tasks. Able to stand for entire oral care task but did need a few minute seated rest break after prior to ambulating again                 Toilet Transfer: Minimal assistance;+2 for safety/equipment;Ambulation;RW;BSC Toilet Transfer Details (indicate cue type and reason): BSC in bathroom         Functional mobility during ADLs: Minimal assistance;+2 for safety/equipment;Min guard General ADL Comments: Pt completed bed mobilty, stood at sink to brush teeth at close min guard level. Min A for sit<>stand transfers. Cues for safety and technique with rw. After seated rest break pt completed household distance functional mobility at close min guard to min A level. Up to recliner at end of  session.     Vision       Perception     Praxis      Cognition Arousal/Alertness: Awake/alert Behavior During Therapy: Flat affect;WFL for tasks assessed/performed Overall Cognitive Status: No family/caregiver present to determine baseline cognitive functioning Area of Impairment:  Attention;Following commands;Safety/judgement;Awareness;Problem solving;Orientation                   Current Attention Level: Sustained   Following Commands: Follows one step commands consistently;Follows one step commands with increased time Safety/Judgement: Decreased awareness of deficits;Decreased awareness of safety Awareness: Intellectual Problem Solving: Slow processing;Difficulty sequencing;Requires verbal cues;Requires tactile cues General Comments: STM deficits,         Exercises     Shoulder Instructions       General Comments      Pertinent Vitals/ Pain       Pain Assessment: Faces Faces Pain Scale: Hurts even more Pain Location: throat, neck, L shoulder, "everywhere" Pain Descriptors / Indicators: Aching;Tingling;Sore Pain Intervention(s): Monitored during session;Limited activity within patient's tolerance;Repositioned  Home Living                                          Prior Functioning/Environment              Frequency  Min 2X/week        Progress Toward Goals  OT Goals(current goals can now be found in the care plan section)  Progress towards OT goals: Progressing toward goals  Acute Rehab OT Goals Patient Stated Goal: to feel better  OT Goal Formulation: With patient Time For Goal Achievement: 11/26/19 Potential to Achieve Goals: Good ADL Goals Pt Will Perform Grooming: with min guard assist;standing Pt Will Perform Lower Body Bathing: with min assist;sit to/from stand;with adaptive equipment Pt Will Perform Lower Body Dressing: with min assist;with adaptive equipment;sit to/from stand Pt Will Transfer to Toilet: with min guard assist;ambulating;regular height toilet;bedside commode;grab bars Pt Will Perform Toileting - Clothing Manipulation and hygiene: with min assist;sit to/from stand Additional ADL Goal #1: Pt will be independent with cervical precautions  Plan Discharge plan needs to be updated     Co-evaluation    PT/OT/SLP Co-Evaluation/Treatment: Yes Reason for Co-Treatment: For patient/therapist safety;To address functional/ADL transfers   OT goals addressed during session: ADL's and self-care      AM-PAC OT "6 Clicks" Daily Activity     Outcome Measure   Help from another person eating meals?: A Little Help from another person taking care of personal grooming?: A Little Help from another person toileting, which includes using toliet, bedpan, or urinal?: A Lot Help from another person bathing (including washing, rinsing, drying)?: A Lot Help from another person to put on and taking off regular upper body clothing?: A Little Help from another person to put on and taking off regular lower body clothing?: A Lot 6 Click Score: 15    End of Session Equipment Utilized During Treatment: Gait belt;Rolling walker;Cervical collar  OT Visit Diagnosis: Unsteadiness on feet (R26.81);Cognitive communication deficit (R41.841);Pain   Activity Tolerance Patient tolerated treatment well   Patient Left in chair;with call bell/phone within reach;with chair alarm set   Nurse Communication Mobility status;Other (comment) (up in recliner)        Time: 7829-5621 OT Time Calculation (min): 24 min  Charges: OT General Charges $OT Visit: 1 Visit OT Treatments $Self Care/Home Management : 8-22 mins  Raynald Kemp, OT Acute Rehabilitation Services Pager: [phone] Office: [phone]    Pilar Grammes 11/18/2019, 1:36 PM

## 2019-11-18 NOTE — Progress Notes (Addendum)
Physical Therapy Treatment Patient Details Name: [name] MRN: [id number] DOB: Dec 02, 1945 Today's Date: 11/18/2019    History of Present Illness Pt is a 74 y/o female admitted secondary to fall, weakness, and numbness in bilateral hands.  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 She underwent C3-4, C4-5, C5-6, C6-7 ACDF on 10/20 PMH includes CVA, HTN, and DM.     PT Comments    Feeling poorly with throat soreness and overall soreness.  Progressing steadily toward goals.  Emphasis on rolling/transitions to EOB with scooting, sit to stand, progressing ambulation stability/stamina, standing activity at the sink.    Follow Up Recommendations  Home health PT;Other (comment);Supervision/Assistance - 24 hour;Supervision - Intermittent (with all available services, unable to go to SNF)     Equipment Recommendations  Other (comment) (TBD)    Recommendations for Other Services       Precautions / Restrictions Precautions Precautions: Fall;Cervical Precaution Booklet Issued: Yes (comment) Required Braces or Orthoses: Cervical Brace Cervical Brace: Hard collar;Other (comment) (off in bed, off to shower, and off to ambulate to BR ) Restrictions Weight Bearing Restrictions: No    Mobility  Bed Mobility Overal bed mobility: Needs Assistance Bed Mobility: Rolling;Sidelying to Sit Rolling: Mod assist Sidelying to sit: Mod assist       General bed mobility comments: cues for technique, assist to powerup trunk and maintain precautions during transitional movements  Transfers Overall transfer level: Needs assistance Equipment used: Rolling walker (2 wheeled) Transfers: Sit to/from Stand Sit to Stand: Min assist;+2 safety/equipment         General transfer comment: cues for technique with rw including hand placement. Min A to steady from EOB, BSC, anc recliner.   Ambulation/Gait Ambulation/Gait assistance: Min  assist;+2 safety/equipment Gait Distance (Feet): 100 Feet (with chair follow  10 feet initially to bathroom) Assistive device: Rolling walker (2 wheeled) Gait Pattern/deviations: Step-through pattern Gait velocity: decr Gait velocity interpretation: <1.8 ft/sec, indicate of risk for recurrent falls General Gait Details: cues for posture and proximity to the RW.  Stability assist overall and some control of the RW   Stairs             Wheelchair Mobility    Modified Rankin (Stroke Patients Only)       Balance Overall balance assessment: Needs assistance Sitting-balance support: Feet supported;Single extremity supported Sitting balance-Leahy Scale: Fair     Standing balance support: Single extremity supported;Bilateral upper extremity supported;During functional activity Standing balance-Leahy Scale: Poor Standing balance comment: able to brush teeth in standing with single extremity support. BUE support for walking                            Cognition Arousal/Alertness: Awake/alert Behavior During Therapy: Flat affect;WFL for tasks assessed/performed Overall Cognitive Status: No family/caregiver present to determine baseline cognitive functioning Area of Impairment: Attention;Following commands;Safety/judgement;Awareness;Problem solving;Orientation                   Current Attention Level: Sustained   Following Commands: Follows one step commands consistently;Follows one step commands with increased time Safety/Judgement: Decreased awareness of deficits;Decreased awareness of safety Awareness: Intellectual Problem Solving: Slow processing;Difficulty sequencing;Requires verbal cues;Requires tactile cues General Comments: STM deficits,       Exercises Other Exercises Other Exercises: warm up LE ROM    General Comments General comments (skin integrity, edema, etc.): Reinforced cervical precautions, general care, bracing issues, log roll  Pertinent Vitals/Pain Pain Assessment: Faces Faces Pain Scale: Hurts even more Pain Location: throat, neck, L shoulder, "everywhere" Pain Descriptors / Indicators: Aching;Tingling;Sore Pain Intervention(s): Monitored during session    Home Living                      Prior Function            PT Goals (current goals can now be found in the care plan section) Acute Rehab PT Goals Patient Stated Goal: to feel better  PT Goal Formulation: With patient Time For Goal Achievement: 12/03/19 Potential to Achieve Goals: Good Progress towards PT goals: Progressing toward goals    Frequency    Min 3X/week      PT Plan Discharge plan needs to be updated    Co-evaluation PT/OT/SLP Co-Evaluation/Treatment: Yes Reason for Co-Treatment: For patient/therapist safety PT goals addressed during session: Mobility/safety with mobility OT goals addressed during session: ADL's and self-care      AM-PAC PT "6 Clicks" Mobility   Outcome Measure  Help needed turning from your back to your side while in a flat bed without using bedrails?: A Little Help needed moving from lying on your back to sitting on the side of a flat bed without using bedrails?: A Lot Help needed moving to and from a bed to a chair (including a wheelchair)?: A Lot Help needed standing up from a chair using your arms (e.g., wheelchair or bedside chair)?: A Little Help needed to walk in hospital room?: A Little Help needed climbing 3-5 steps with a railing? : A Lot 6 Click Score: 15    End of Session Equipment Utilized During Treatment: Cervical collar Activity Tolerance: Patient tolerated treatment well;Patient limited by pain Patient left: in chair;with call bell/phone within reach;with chair alarm set Nurse Communication: Mobility status PT Visit Diagnosis: Unsteadiness on feet (R26.81);Muscle weakness (generalized) (M62.81);History of falling (Z91.81)     Time: 7124-5809 PT Time Calculation (min)  (ACUTE ONLY): 31 min  Charges:  $Gait Training: 8-22 mins                     11/18/2019  Jacinto Halim., PT Acute Rehabilitation Services [phone]  (pager) [phone]  (office)   Eliseo Gum Suraiya Dickerson 11/18/2019, 2:29 PM

## 2019-11-18 NOTE — TOC Progression Note (Signed)
Transition of Care [facility]) - Progression Note    Patient Details  Name: [name] MRN: [id number] Date of Birth: Oct 15, 1945  Transition of Care [facility]) CM/SW Contact  Lawerance Sabal, RN Phone Number: 11/18/2019, 3:36 PM  Clinical Narrative:     Spoke w patient at bedside. She lives alone. She states that she does not have help from family, and there is no family to help her after DC.   Her daughter works, and relies on public transportation to get to work, would not be able to assist with transportation. Patient states that she uses the bus or cabs to get to the store etc. At this time she is unable to ambulate satisfactorily for public transportation.   Spoke w Rocky Link, PT, discussed her progress. She is unable to even roll her herself over to get OOB. Patient is also very slow. He increased her scheduled therapy to everyday, to maximize therapy sessions while in hospital, and will work with her more with bathroom navigation.  Patient will need DME at DC, does not have any at home.  Patient has no transportation home. Will need [facility], [facility] transportation, or cab. She states she has a key to get in her house. Getting DME home with her may be an obstacle unless [facility] transportation could assist with her DC and/ or transporting her DME.   Patient does not have a PCP, is a frequent utilizer of the ED.   Safe plan for DC home could not be established at this time. Discussed with TOC supervisor.      Expected Discharge Plan: Skilled Nursing Facility Barriers to Discharge: Continued Medical Work up, English as a second language teacher, SNF Pending bed offer  Expected Discharge Plan and Services Expected Discharge Plan: Skilled Nursing Facility       Living arrangements for the past 2 months: Single Family Home                                       Social Determinants of Health (SDOH) Interventions    Readmission Risk Interventions No flowsheet data found.

## 2019-11-18 NOTE — Progress Notes (Signed)
Subjective: Patient reports that she is doing well overall. Continues to report some breakthrough pain and throat discomfort.   Objective: Vital signs in last 24 hours: Temp:  [97.9 F (36.6 C)-98.2 F (36.8 C)] 98.2 F (36.8 C) (10/27 0318) Pulse Rate:  [84-96] 87 (10/27 0318) Resp:  [10-22] 20 (10/27 0318) BP: (123-181)/(55-74) 146/55 (10/27 0318) SpO2:  [94 %-97 %] 94 % (10/27 0318)  Intake/Output from previous day: 10/26 0701 - 10/27 0700 In: 425 [P.O.:425] Out: -  Intake/Output this shift: No intake/output data recorded.  Physical Exam: Awake, alert, and conversant. Patient is doing well. MAEW with good power. Doing well. Incisions CDI.  Lab Results: No results for input(s): WBC, HGB, HCT, PLT in the last 72 hours. BMET No results for input(s): NA, K, CL, CO2, GLUCOSE, BUN, CREATININE, CALCIUM in the last 72 hours.  Studies/Results: No results found.  Assessment/Plan: Patient is continuing to improve. Continue working on pain control, mobility and ambulation.  Continue working with therapies while awaiting SNF placement.   LOS: 8 days    Council Mechanic, DNP, NP-C 11/18/2019, 7:45 AM

## 2019-11-19 LAB — GLUCOSE, CAPILLARY
Glucose-Capillary: 162 mg/dL — ABNORMAL HIGH (ref 70–99)
Glucose-Capillary: 241 mg/dL — ABNORMAL HIGH (ref 70–99)
Glucose-Capillary: 270 mg/dL — ABNORMAL HIGH (ref 70–99)
Glucose-Capillary: 144 mg/dL — ABNORMAL HIGH (ref 70–99)

## 2019-11-19 NOTE — Progress Notes (Signed)
Physical Therapy Treatment Patient Details Name: [name] MRN: [id number] DOB: [birth year] Today's Date: 11/19/2019    History of Present Illness Pt is a 74 y/o female admitted secondary to fall, weakness, and numbness in bilateral hands.  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 She underwent C3-4, C4-5, C5-6, C6-7 ACDF on 10/20 PMH includes CVA, HTN, and DM.     PT Comments    Pt still has some progress needed before safe to be at home alone, as she will be.  Emphasis on education/safety discussions, transitions, sit to stand, and progressing gait significantly further.    Follow Up Recommendations  Other (comment);Home health PT;Supervision/Assistance - 24 hour (needs SNF, but has been denied)     Equipment Recommendations  Other (comment) (TBD)    Recommendations for Other Services       Precautions / Restrictions Precautions Precautions: Fall;Cervical Precaution Comments: Pt instructed in cervical precautions; BLT rules, log roll in and out of bed Required Braces or Orthoses: Cervical Brace Cervical Brace: Hard collar;Other (comment)    Mobility  Bed Mobility Overal bed mobility: Needs Assistance Bed Mobility: Rolling;Sidelying to Sit Rolling: Min assist Sidelying to sit: Mod assist       General bed mobility comments: cues for technique, assist to powerup trunk and maintain precautions during transitional movements  Transfers Overall transfer level: Needs assistance Equipment used: Rolling walker (2 wheeled) Transfers: Sit to/from Stand Sit to Stand: Min assist         General transfer comment: continues to need cues for hand placement/transfer safety  Ambulation/Gait Ambulation/Gait assistance: Min assist;+2 safety/equipment;Min guard Gait Distance (Feet): 100 Feet (x2) Assistive device: Rolling walker (2 wheeled) Gait Pattern/deviations: Step-through pattern Gait velocity: decr Gait  velocity interpretation: <1.8 ft/sec, indicate of risk for recurrent falls General Gait Details: cues for posture and proximity to the RW.    Stairs             Wheelchair Mobility    Modified Rankin (Stroke Patients Only)       Balance     Sitting balance-Leahy Scale: Fair       Standing balance-Leahy Scale: Poor                              Cognition Arousal/Alertness: Awake/alert Behavior During Therapy: WFL for tasks assessed/performed;Flat affect Overall Cognitive Status: No family/caregiver present to determine baseline cognitive functioning                     Current Attention Level: Sustained   Following Commands: Follows one step commands with increased time;Follows one step commands consistently Safety/Judgement: Decreased awareness of deficits;Decreased awareness of safety Awareness: Intellectual Problem Solving: Slow processing;Difficulty sequencing;Requires verbal cues;Requires tactile cues        Exercises      General Comments General comments (skin integrity, edema, etc.): reinforcing education of bed mobility/transitions, transfer safety, wearing schedule for brace and general cervical education      Pertinent Vitals/Pain Pain Assessment: Faces Faces Pain Scale: Hurts even more Pain Location: increasing pain over course of session in neck/shoulders Pain Descriptors / Indicators: Aching;Grimacing;Discomfort Pain Intervention(s): Monitored during session    Home Living                      Prior Function            PT Goals (current  goals can now be found in the care plan section) Acute Rehab PT Goals Patient Stated Goal: to feel better  PT Goal Formulation: With patient Time For Goal Achievement: 12/03/19 Potential to Achieve Goals: Good Progress towards PT goals: Progressing toward goals    Frequency    Min 3X/week      PT Plan Current plan remains appropriate    Co-evaluation               AM-PAC PT "6 Clicks" Mobility   Outcome Measure  Help needed turning from your back to your side while in a flat bed without using bedrails?: A Little Help needed moving from lying on your back to sitting on the side of a flat bed without using bedrails?: A Lot Help needed moving to and from a bed to a chair (including a wheelchair)?: A Little Help needed standing up from a chair using your arms (e.g., wheelchair or bedside chair)?: A Little Help needed to walk in hospital room?: A Little Help needed climbing 3-5 steps with a railing? : A Lot 6 Click Score: 16    End of Session Equipment Utilized During Treatment: Cervical collar Activity Tolerance: Patient tolerated treatment well;Patient limited by pain Patient left: in chair;with call bell/phone within reach;with chair alarm set Nurse Communication: Mobility status PT Visit Diagnosis: Unsteadiness on feet (R26.81);Muscle weakness (generalized) (M62.81);History of falling (Z91.81)     Time: 1041-1106 PT Time Calculation (min) (ACUTE ONLY): 25 min  Charges:  $Gait Training: 8-22 mins $Therapeutic Activity: 8-22 mins                     11/19/2019  [name]., PT Acute Rehabilitation Services [phone]  (pager) [phone]  (office)   [name] 11/19/2019, 5:18 PM

## 2019-11-19 NOTE — Progress Notes (Signed)
Subjective: Patient reports that she is feeling better today and had a restful night. She is requesting the influenza immunization prior to discharge.   Objective: Vital signs in last 24 hours: Temp:  [98 F (36.7 C)-98.5 F (36.9 C)] 98.5 F (36.9 C) (10/28 0736) Pulse Rate:  [88-99] 89 (10/28 0736) Resp:  [16-21] 16 (10/28 0736) BP: (130-171)/(55-77) 157/57 (10/28 0736) SpO2:  [94 %-99 %] 94 % (10/28 0736)  Intake/Output from previous day: No intake/output data recorded. Intake/Output this shift: No intake/output data recorded.  Physical Exam: Awake, A/O X 4, and conversant. Patient is continuing to improve. Continues to have minimal sore throat that is improving.  MAEW with good power.  Incisions CDI.   Lab Results: No results for input(s): WBC, HGB, HCT, PLT in the last 72 hours. BMET No results for input(s): NA, K, CL, CO2, GLUCOSE, BUN, CREATININE, CALCIUM in the last 72 hours.  Studies/Results: No results found.  Assessment/Plan: Patient continues to improve. She reports no cervical or back pain. Still has some c/o mild sore throat. Continue working on mobility and ambulation. Continue working with PT/OT. Plan for SNF once approved.    LOS: 9 days     Council Mechanic, DNP, NP-C 11/19/2019, 8:39 AM

## 2019-11-20 LAB — GLUCOSE, CAPILLARY
Glucose-Capillary: 183 mg/dL — ABNORMAL HIGH (ref 70–99)
Glucose-Capillary: 200 mg/dL — ABNORMAL HIGH (ref 70–99)
Glucose-Capillary: 101 mg/dL — ABNORMAL HIGH (ref 70–99)
Glucose-Capillary: 201 mg/dL — ABNORMAL HIGH (ref 70–99)

## 2019-11-20 NOTE — Progress Notes (Signed)
Subjective: Patient reports that she is not feeling as well his morning and is experiencing some cervical and posterior shoulder pain.   Objective: Vital signs in last 24 hours: Temp:  [97.6 F (36.4 C)-98 F (36.7 C)] 97.9 F (36.6 C) (10/29 0744) Pulse Rate:  [75-90] 75 (10/29 0744) Resp:  [14] 14 (10/29 0744) BP: (120-144)/(59-71) 144/59 (10/29 0744) SpO2:  [96 %-100 %] 97 % (10/29 0744)  Intake/Output from previous day: No intake/output data recorded. Intake/Output this shift: No intake/output data recorded.  Physical Exam: She is A/O X 4 and conversant. MAEW with good power. Bandage has ben removed and the incision is well approximated with no erythema, drainage, edema, or odor.  Lab Results: No results for input(s): WBC, HGB, HCT, PLT in the last 72 hours. BMET No results for input(s): NA, K, CL, CO2, GLUCOSE, BUN, CREATININE, CALCIUM in the last 72 hours.  Studies/Results: No results found.  Assessment/Plan: Patient is doing well. She is experiencing some postoperative incisional pain as well as posterior upper back pain that is being well controlled with PO pain medications. Continue working with therapies and encourage mobility and ambulation. Awaiting discharge plan and SNF placement.   LOS: 10 days      Council Mechanic, DNP, NP-C 11/20/2019, 9:08 AM

## 2019-11-20 NOTE — Progress Notes (Signed)
Physical Therapy Treatment Patient Details Name: [name] MRN: [id number] DOB: Apr 12, 1945 Today's Date: 11/20/2019    History of Present Illness Pt is a 74 y/o female admitted secondary to fall, weakness, and numbness in bilateral hands.  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 She underwent C3-4, C4-5, C5-6, C6-7 ACDF on 10/20 PMH includes CVA, HTN, and DM.     PT Comments    Pt slowly improving in moving toward goals.  Emphasis on education and safety.  Ambulation in the RW progressing in distance and stability.   Follow Up Recommendations  Other (comment);Home health PT;Supervision/Assistance - 24 hour (needs SNF, but has been denied)     Equipment Recommendations  Other (comment) (TBD)    Recommendations for Other Services       Precautions / Restrictions Precautions Precautions: Fall;Cervical Precaution Comments: Pt instructed in cervical precautions; BLT rules, log roll in and out of bed Required Braces or Orthoses: Cervical Brace Cervical Brace: Hard collar;Other (comment)    Mobility  Bed Mobility               General bed mobility comments: oob on arrival  Transfers Overall transfer level: Needs assistance Equipment used: Rolling walker (2 wheeled) Transfers: Sit to/from Stand Sit to Stand: Min assist         General transfer comment: continues to need cues for hand placement/transfer safety  Ambulation/Gait Ambulation/Gait assistance: Min assist;+2 safety/equipment;Min guard Gait Distance (Feet): 200 Feet Assistive device: Rolling walker (2 wheeled) Gait Pattern/deviations: Step-through pattern Gait velocity: decr Gait velocity interpretation: <1.8 ft/sec, indicate of risk for recurrent falls General Gait Details: cues for posture and proximity to the RW, frequent stops to rest and regroup.  pt continues to fear ambulating by herself and wants contact assist at all  times.   Stairs             Wheelchair Mobility    Modified Rankin (Stroke Patients Only)       Balance Overall balance assessment: Needs assistance Sitting-balance support: Feet supported;Single extremity supported Sitting balance-Leahy Scale: Fair       Standing balance-Leahy Scale: Poor                              Cognition Arousal/Alertness: Awake/alert Behavior During Therapy: WFL for tasks assessed/performed;Flat affect Overall Cognitive Status: No family/caregiver present to determine baseline cognitive functioning                     Current Attention Level: Sustained   Following Commands: Follows one step commands with increased time;Follows one step commands consistently Safety/Judgement: Decreased awareness of deficits;Decreased awareness of safety Awareness: Intellectual Problem Solving: Slow processing;Difficulty sequencing;Requires verbal cues;Requires tactile cues General Comments: STM deficits, does not retain safety instruction/education from 1 day to another.      Exercises      General Comments General comments (skin integrity, edema, etc.): reinforcing education of bed mobility/transitions, transfer safety, wearing schedule for brace and general cervical education      Pertinent Vitals/Pain Pain Assessment: Faces Faces Pain Scale: Hurts little more Pain Location: increasing pain over course of session in neck/shoulders Pain Descriptors / Indicators: Aching;Grimacing;Discomfort Pain Intervention(s): Monitored during session    Home Living                      Prior Function  PT Goals (current goals can now be found in the care plan section) Acute Rehab PT Goals Patient Stated Goal: to feel better  PT Goal Formulation: With patient Time For Goal Achievement: 12/03/19 Potential to Achieve Goals: Good Progress towards PT goals: Progressing toward goals    Frequency    Min 3X/week       PT Plan      Co-evaluation              AM-PAC PT "6 Clicks" Mobility   Outcome Measure  Help needed turning from your back to your side while in a flat bed without using bedrails?: A Little Help needed moving from lying on your back to sitting on the side of a flat bed without using bedrails?: A Lot Help needed moving to and from a bed to a chair (including a wheelchair)?: A Little Help needed standing up from a chair using your arms (e.g., wheelchair or bedside chair)?: A Little Help needed to walk in hospital room?: A Little Help needed climbing 3-5 steps with a railing? : A Lot 6 Click Score: 16    End of Session Equipment Utilized During Treatment: Cervical collar;Other (comment) (off on arrival, talked about importance of keeping it on) Activity Tolerance: Patient tolerated treatment well;Patient limited by pain Patient left: in chair;with call bell/phone within reach;with chair alarm set Nurse Communication: Mobility status PT Visit Diagnosis: Unsteadiness on feet (R26.81);Muscle weakness (generalized) (M62.81);History of falling (Z91.81)     Time: 1791-5056 PT Time Calculation (min) (ACUTE ONLY): 25 min  Charges:  $Gait Training: 8-22 mins $Therapeutic Activity: 8-22 mins                     11/20/2019  Jacinto Halim., PT Acute Rehabilitation Services [phone]  (pager) [phone]  (office)   Eliseo Gum Broox Lonigro 11/20/2019, 11:07 AM

## 2019-11-21 LAB — GLUCOSE, CAPILLARY
Glucose-Capillary: 186 mg/dL — ABNORMAL HIGH (ref 70–99)
Glucose-Capillary: 167 mg/dL — ABNORMAL HIGH (ref 70–99)
Glucose-Capillary: 152 mg/dL — ABNORMAL HIGH (ref 70–99)
Glucose-Capillary: 182 mg/dL — ABNORMAL HIGH (ref 70–99)

## 2019-11-21 NOTE — Progress Notes (Signed)
NEUROSURGERY PROGRESS NOTE  Doing well. Complains of appropriate neck soreness. No arm pain No numbness, tingling or weakness Incision CDI  Temp:  [97.9 F (36.6 C)-98.5 F (36.9 C)] 98.4 F (36.9 C) (10/30 0343) Pulse Rate:  [75-99] 85 (10/30 0343) Resp:  [13-18] 18 (10/30 0343) BP: (107-155)/(49-71) 136/57 (10/30 0343) SpO2:  [96 %-97 %] 97 % (10/30 0343)  Plan:  Awaiting SNF placement but has been denied by insurance. Therapy states she is not safe to go home by herself nor does she have anyone to take care of her at home because she lives by herself. Continue working on placement. Therapy today.   Sherryl Manges, NP 11/21/2019 7:36 AM

## 2019-11-21 NOTE — Progress Notes (Signed)
Patient continues to complain of throat and shoulder pain 10/10 with lowest comfort level of 7/10. Patient moans and groans and states she doesn't feel good and is weak. Patient however noted to be talking loudly interacting with the televison, refused meals but states she is hungry. Requested her flu shot today so it was given as ordered in rt deltoid. Patient also noted to appear "surprised" that she had to call for assistance with going to the bathroom and that she had to receive insulin as coverage for all meals. Education provided and patient chuckled. No acute distress noted.

## 2019-11-22 LAB — GLUCOSE, CAPILLARY
Glucose-Capillary: 150 mg/dL — ABNORMAL HIGH (ref 70–99)
Glucose-Capillary: 149 mg/dL — ABNORMAL HIGH (ref 70–99)
Glucose-Capillary: 149 mg/dL — ABNORMAL HIGH (ref 70–99)
Glucose-Capillary: 223 mg/dL — ABNORMAL HIGH (ref 70–99)

## 2019-11-22 NOTE — Progress Notes (Signed)
NEUROSURGERY PROGRESS NOTE Doing ok this morning, seems to be a little more upset. Having neck pain and some numbness and tingling in her hands. After discussion it does seem that she had this before surgery but maybe a little worse? Her strength is still very strong this morning so we will hold off on any imaging. Please call me if her symptoms worsen  Temp:  [98.3 F (36.8 C)-98.6 F (37 C)] 98.4 F (36.9 C) (10/31 0737) Pulse Rate:  [63-94] 82 (10/31 0737) Resp:  [15-20] 20 (10/31 0737) BP: (122-154)/(55-94) 122/94 (10/31 0737) SpO2:  [95 %-100 %] 100 % (10/31 0737)   Sherryl Manges, NP 11/22/2019 9:23 AM

## 2019-11-22 NOTE — Progress Notes (Signed)
Patient upon assessment this am states that the kitchen should be ashamed of themself serving old food. RN inquired about what did she mean and patient stated the yogurt and cereal were old. The yogurt was dated to expire 01/04/2020. RN reported this to the patient but she insisted that it was old because she could taste it but it was noted to be completely consumed all but one spoonful. Dietary was contacted for a new tray of toast and bacon with orange juice. Patient is also complaining again today of generalized pain 10/10 and states "my arms, legs, back, all over honey but my throat hurts worst today than yesterday.I just hurt so bad honey." Pain meds to be given as ordered.

## 2019-11-22 NOTE — Progress Notes (Signed)
Thoughout this shift patient has been very irritable stating that the food was old, her hands were numb and tinging and her throat incision was worse today. Patient kept saying that her numbness is worse and not better. Patient also complained of pain all over requiring pain meds but no complete relieve was obtained. Heat packs were applied to hands and shoulder for relief. Patient also noted to have coban tight on her wrist and RN cut it off and patient became irritable about the removal stating it helped with pain. RN educated patient on the risks of the coban being too tight and compromising circulation and stated an ace wrap would be more beneficial than tight coban. RN attempted to talk patient into trying to take her medications whole instead of crushed but patient stated that "no honey, I can't, I can't! My throat hurts too bad when I swallow. It hurts more today." Patient also encouraged to call in her own breakfast,lunch, and dinner choices since she states she does not like what they send but she refuses even with assistance. Patient did sit up in recliner today with RN assistance and ambulated with walker and standby assistance to bathroom today. Will continue to monitor.

## 2019-11-23 LAB — GLUCOSE, CAPILLARY
Glucose-Capillary: 204 mg/dL — ABNORMAL HIGH (ref 70–99)
Glucose-Capillary: 158 mg/dL — ABNORMAL HIGH (ref 70–99)
Glucose-Capillary: 134 mg/dL — ABNORMAL HIGH (ref 70–99)
Glucose-Capillary: 122 mg/dL — ABNORMAL HIGH (ref 70–99)

## 2019-11-23 MED ORDER — LIDOCAINE 5 % EX PTCH
1.0000 | MEDICATED_PATCH | CUTANEOUS | Status: DC
  Administered 2019-11-23 – 2019-11-26 (×4): 1 via TRANSDERMAL
  Filled 2019-11-23 (×4): qty 1

## 2019-11-23 NOTE — Progress Notes (Signed)
Physical Therapy Treatment Patient Details Name: [name] MRN: [id number] DOB: Nov 09, 1945 Today's Date: 11/23/2019    History of Present Illness Pt is a 74 y/o female admitted secondary to fall, weakness, and numbness in bilateral hands.  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 She underwent C3-4, C4-5, C5-6, C6-7 ACDF on 10/20 PMH includes CVA, HTN, and DM.     PT Comments    Pt demonstrating gradual progress but continues to present with decreased safety awareness and decreased recall of precautions.  She required min guard - min A for all activities with cues for neck precautions and required rest breaks.  Pt remains unsafe to return home alone and would need 24 hr supervision (has been denied SNF) due to decreased safety awareness and inability to complete transfers independently.     Follow Up Recommendations  Other (comment);Home health PT;Supervision/Assistance - 24 hour (Needs SNF but has been denied)     Equipment Recommendations  Rolling walker with 5" wheels;3in1 (PT)    Recommendations for Other Services       Precautions / Restrictions Precautions Precautions: Fall;Cervical Precaution Booklet Issued: Yes (comment) Precaution Comments: Pt instructed in cervical precautions; BLT rules, log roll in and out of bed Required Braces or Orthoses: Cervical Brace Cervical Brace: Hard collar    Mobility  Bed Mobility Overal bed mobility: Needs Assistance Bed Mobility: Rolling;Sidelying to Sit Rolling: Min assist Sidelying to sit: Min assist;HOB elevated       General bed mobility comments: cues for log roll technique  Transfers Overall transfer level: Needs assistance Equipment used: Rolling walker (2 wheeled) Transfers: Sit to/from Stand Sit to Stand: Min guard         General transfer comment: Min guard for safety  Ambulation/Gait Ambulation/Gait assistance: Min guard Gait Distance (Feet): 75  Feet (75'x3) Assistive device: Rolling walker (2 wheeled) Gait Pattern/deviations: Step-through pattern Gait velocity: decr   General Gait Details: 75'x3 with 2 standing rest breaks; cues for posture, limit turning of head, and RW proximity   Stairs             Wheelchair Mobility    Modified Rankin (Stroke Patients Only)       Balance Overall balance assessment: Needs assistance Sitting-balance support: Feet supported;No upper extremity supported Sitting balance-Leahy Scale: Good     Standing balance support: Bilateral upper extremity supported;No upper extremity supported Standing balance-Leahy Scale: Fair Standing balance comment: Used RW for ambulation but was able to lift hands to don mask in static standing                            Cognition Arousal/Alertness: Awake/alert Behavior During Therapy: WFL for tasks assessed/performed Overall Cognitive Status: No family/caregiver present to determine baseline cognitive functioning Area of Impairment: Attention;Following commands;Safety/judgement;Awareness;Problem solving;Orientation;Memory                 Orientation Level: Disoriented to;Time Current Attention Level: Sustained Memory: Decreased short-term memory Following Commands: Follows one step commands with increased time;Follows one step commands consistently Safety/Judgement: Decreased awareness of deficits;Decreased awareness of safety Awareness: Emergent Problem Solving: Difficulty sequencing;Requires verbal cues;Requires tactile cues General Comments: Short term memory deficits - unable to recall precautions, also repeatedly asked same questions or told same story      Exercises      General Comments        Pertinent Vitals/Pain Pain Assessment: 0-10 Pain Score:  8  Pain Location: Neck and back Pain Descriptors / Indicators: Aching;Grimacing;Discomfort Pain Intervention(s): Limited activity within patient's  tolerance;Monitored during session;Repositioned (pain decreased in chair)    Home Living                      Prior Function            PT Goals (current goals can now be found in the care plan section) Acute Rehab PT Goals Patient Stated Goal: to feel better  PT Goal Formulation: With patient Time For Goal Achievement: 12/03/19 Potential to Achieve Goals: Good Progress towards PT goals: Progressing toward goals    Frequency    Min 3X/week      PT Plan Current plan remains appropriate    Co-evaluation              AM-PAC PT "6 Clicks" Mobility   Outcome Measure  Help needed turning from your back to your side while in a flat bed without using bedrails?: A Little Help needed moving from lying on your back to sitting on the side of a flat bed without using bedrails?: A Little Help needed moving to and from a bed to a chair (including a wheelchair)?: A Little Help needed standing up from a chair using your arms (e.g., wheelchair or bedside chair)?: A Little Help needed to walk in hospital room?: A Little Help needed climbing 3-5 steps with a railing? : A Little 6 Click Score: 18    End of Session Equipment Utilized During Treatment: Cervical collar;Gait belt Activity Tolerance: Patient tolerated treatment well Patient left: in chair;with call bell/phone within reach;with chair alarm set Nurse Communication: Mobility status PT Visit Diagnosis: Unsteadiness on feet (R26.81);Muscle weakness (generalized) (M62.81);History of falling (Z91.81)     Time: 9379-0240 PT Time Calculation (min) (ACUTE ONLY): 23 min  Charges:  $Gait Training: 8-22 mins $Therapeutic Activity: 8-22 mins                     Anise Salvo, PT Acute Rehab Services Pager [phone] [name] Re[facility] [phone]     [name] 11/23/2019, 2:21 PM

## 2019-11-23 NOTE — Progress Notes (Signed)
Patient encouraged to use cervical collar as prescribed by surgeon at all times. Patient refuses use. Patient did however wear cervical collar while ambulating with PT and tolerated well.

## 2019-11-23 NOTE — Plan of Care (Signed)
  Problem: Education: Goal: Knowledge of General Education information will improve Description: Including pain rating scale, medication(s)/side effects and non-pharmacologic comfort measures Outcome: Progressing   Problem: Health Behavior/Discharge Planning: Goal: Ability to manage health-related needs will improve Outcome: Progressing   Problem: Clinical Measurements: Goal: Ability to maintain clinical measurements within normal limits will improve Outcome: Progressing Goal: Will remain free from infection Outcome: Progressing Goal: Diagnostic test results will improve Outcome: Progressing Goal: Respiratory complications will improve Outcome: Progressing Goal: Cardiovascular complication will be avoided Outcome: Progressing   Problem: Activity: Goal: Risk for activity intolerance will decrease Outcome: Progressing   Problem: Nutrition: Goal: Adequate nutrition will be maintained Outcome: Progressing   Problem: Coping: Goal: Level of anxiety will decrease Outcome: Progressing   Problem: Elimination: Goal: Will not experience complications related to bowel motility Outcome: Progressing Goal: Will not experience complications related to urinary retention Outcome: Progressing   Problem: Elimination: Goal: Will not experience complications related to bowel motility Outcome: Progressing Goal: Will not experience complications related to urinary retention Outcome: Progressing   Problem: Pain Managment: Goal: General experience of comfort will improve Outcome: Progressing   Problem: Safety: Goal: Ability to remain free from injury will improve Outcome: Progressing   Problem: Skin Integrity: Goal: Risk for impaired skin integrity will decrease Outcome: Progressing   Problem: Education: Goal: Ability to verbalize activity precautions or restrictions will improve Outcome: Progressing Goal: Knowledge of the prescribed therapeutic regimen will improve Outcome:  Progressing Goal: Understanding of discharge needs will improve Outcome: Progressing   Problem: Activity: Goal: Ability to avoid complications of mobility impairment will improve Outcome: Progressing Goal: Ability to tolerate increased activity will improve Outcome: Progressing Goal: Will remain free from falls Outcome: Progressing   Problem: Activity: Goal: Ability to avoid complications of mobility impairment will improve Outcome: Progressing Goal: Ability to tolerate increased activity will improve Outcome: Progressing Goal: Will remain free from falls Outcome: Progressing   Problem: Bowel/Gastric: Goal: Gastrointestinal status for postoperative course will improve Outcome: Progressing   Problem: Pain Management: Goal: Pain level will decrease Outcome: Progressing   Problem: Skin Integrity: Goal: Will show signs of wound healing Outcome: Progressing   Problem: Health Behavior/Discharge Planning: Goal: Identification of resources available to assist in meeting health care needs will improve Outcome: Progressing

## 2019-11-23 NOTE — Progress Notes (Addendum)
Subjective: Patient reports numbness in her bilateral hands and chronic left shoulder pain. Otherwise she feels as though she is doing well.  Objective: Vital signs in last 24 hours: Temp:  [97.5 F (36.4 C)-98.5 F (36.9 C)] 97.9 F (36.6 C) (11/01 0810) Pulse Rate:  [73-94] 86 (11/01 0810) Resp:  [20-22] 20 (11/01 0810) BP: (126-153)/(55-83) 153/61 (11/01 0810) SpO2:  [96 %-100 %] 97 % (11/01 0810)  Intake/Output from previous day: 10/31 0701 - 11/01 0700 In: 1080 [P.O.:1080] Out: 0  Intake/Output this shift: No intake/output data recorded.  Physical Exam: She is A/O X 4 and conversant. MAEW with good power.Incision is well approximated with no erythema, drainage, edema, or odor.  Lab Results: No results for input(s): WBC, HGB, HCT, PLT in the last 72 hours. BMET No results for input(s): NA, K, CL, CO2, GLUCOSE, BUN, CREATININE, CALCIUM in the last 72 hours.  Studies/Results: No results found.  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 I am hopeful that this will improve. Will try a lidocaine patch on her left shoulder. Continue hard cervical collar when OOB, encouraging mobilization and ambulation, and working with therapies. Cotinine working on placement after discharge.   LOS: 13 days    Council Mechanic, DNP, NP-C 11/23/2019, 9:09 AM    Patient is continuing to make steady progress.  Awaiting placement.

## 2019-11-23 NOTE — TOC Progression Note (Addendum)
Transition of Care [facility]) - Progression Note    Patient Details  Name: [name] MRN: [id number] Date of Birth: 01/07/1946  Transition of Care [facility]) CM/SW Contact  Eduard Roux, [place] Phone Number: 11/23/2019, 11:46 AM  Clinical Narrative:     Patient is difficult to place- She has three claims pending making her liability insurance primary and Medicare secondary- SNF will not accept because her insurance(medicare)will not pay with pending claims that could be related to reason why patient is in the hospital.   CSW called patient's daughter- voice mail full- CSW unable to leave voice message  CSW will continue to follow and assist with discharge planning.  Antony Blackbird, MSW, LCSWA Clinical Social Worker   Expected Discharge Plan: Skilled Nursing Facility Barriers to Discharge: Continued Medical Work up, English as a second language teacher, SNF Pending bed offer  Expected Discharge Plan and Services Expected Discharge Plan: Skilled Nursing Facility       Living arrangements for the past 2 months: Single Family Home                                       Social Determinants of Health (SDOH) Interventions    Readmission Risk Interventions No flowsheet data found.

## 2019-11-24 LAB — GLUCOSE, CAPILLARY
Glucose-Capillary: 166 mg/dL — ABNORMAL HIGH (ref 70–99)
Glucose-Capillary: 88 mg/dL (ref 70–99)
Glucose-Capillary: 111 mg/dL — ABNORMAL HIGH (ref 70–99)
Glucose-Capillary: 158 mg/dL — ABNORMAL HIGH (ref 70–99)

## 2019-11-24 NOTE — Progress Notes (Signed)
Occupational Therapy Treatment Patient Details Name: [name] MRN: [id number] DOB: November 22, 1945 Today's Date: 11/24/2019    History of present illness Pt is a 74 y/o female admitted secondary to fall, weakness, and numbness in bilateral hands.  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 She underwent C3-4, C4-5, C5-6, C6-7 ACDF on 10/20 PMH includes CVA, HTN, and DM.    OT comments  Pt making steady progress towards OT goals this session. Pt seated in recliner upon OTA arrival with collar doffed c/o of pain. Applied collar with pt stating " this feels so much better." Pt continues to present with pain, decreased sensation in bil hands, and impaired safety awareness impacting pts ability to complete BADLs. Overall, pt requires supervision for grooming tasks at sink needing cues to carryover compensatory methods from previous session, MAX A for UB ADLs and MAX A for LB ADLs. Pt with limited recall of precautions despite extensive education. Pt presents with some cognitive impairments in the areas of attention, following commands, safety/judgement, awareness, problem solving, orientation, and Memory impacting pts functional independence with ADLs. Noted pt unable to go to SNF. Feel pt would need maximum amt of HH services as indicated below with 24 hour supervision, will follow acutely per POC.    Follow Up Recommendations  Supervision/Assistance - 24 hour;Home health OT;Other (comment);SNF (maximize HH services)    Equipment Recommendations  3 in 1 bedside commode    Recommendations for Other Services      Precautions / Restrictions Precautions Precautions: Fall;Cervical Precaution Booklet Issued: Yes (comment) (in room) Precaution Comments: reviewed cervical precautions throughout session, pt with limited carryover needing MAX cues to recall at end of session Required Braces or Orthoses: Cervical Brace Cervical Brace: Hard  collar Restrictions Weight Bearing Restrictions: No       Mobility Bed Mobility               General bed mobility comments: OOB in chair  Transfers Overall transfer level: Needs assistance Equipment used: Rolling walker (2 wheeled) Transfers: Sit to/from Stand Sit to Stand: Min guard         General transfer comment: min guard for safety and cues for hand placment each trial, cues for safety when descending onto seated surface as pt noted to almost sit on armrests of chair with no awareness    Balance Overall balance assessment: Needs assistance Sitting-balance support: Feet supported;No upper extremity supported Sitting balance-Leahy Scale: Good     Standing balance support: Single extremity supported;During functional activity Standing balance-Leahy Scale: Poor Standing balance comment: at least one UE supported during ADLs at sink                           ADL either performed or assessed with clinical judgement   ADL Overall ADL's : Needs assistance/impaired     Grooming: Oral care;Supervision/safety;Set up;Cueing for compensatory techniques;Standing Grooming Details (indicate cue type and reason): supervision for safety while standing at sink, cues for compensatory method to carryover 2 cup method. pt with no recall of previous education         Upper Body Dressing : Maximal assistance;Sitting Upper Body Dressing Details (indicate cue type and reason): to don cervical collar Lower Body Dressing: Maximal assistance Lower Body Dressing Details (indicate cue type and reason): unable to perform figure 4  Toilet Transfer: Min IT sales professional Details (indicate cue type and reason): simulated via functional  mobility, min guard for safety and min cues for RW mgmt         Functional mobility during ADLs: Min guard;Rolling walker;Cueing for safety General ADL Comments: pt limited by decreased safety awareness, pain and limited  recall of cerival precautions despite education     Vision       Perception     Praxis      Cognition Arousal/Alertness: Awake/alert Behavior During Therapy: WFL for tasks assessed/performed Overall Cognitive Status: No family/caregiver present to determine baseline cognitive functioning Area of Impairment: Attention;Following commands;Safety/judgement;Awareness;Problem solving;Orientation;Memory                 Orientation Level: Disoriented to;Time (pt able to state correct month but later states " we already missed thanksgiving") Current Attention Level: Sustained Memory: Decreased short-term memory (unable to recall cervical precautions reviewed at start of session) Following Commands: Follows one step commands with increased time;Follows one step commands consistently Safety/Judgement: Decreased awareness of deficits;Decreased awareness of safety Awareness: Emergent Problem Solving: Difficulty sequencing;Requires verbal cues;Requires tactile cues General Comments: Short term memory deficits - unable to recall precautions. asked repeated questions and continued to state " I feel like a butt"        Exercises Other Exercises Other Exercises: pt reports numbness in bil hands but able to grip toothbush and self feed with no issues, grip strength 5/5, bil opposition WNL   Shoulder Instructions       General Comments reinforcing education of brace wearing schedule ( brace doffed upon arrival), cervical precautions in relation to ADLs, and compensatory methods in order to maintain cerical precautions.    Pertinent Vitals/ Pain       Pain Assessment: Faces Faces Pain Scale: Hurts even more Pain Location: Neck and throat Pain Descriptors / Indicators: Aching;Grimacing;Discomfort Pain Intervention(s): Monitored during session;Repositioned;Other (comment) (applied brace for support)  Home Living Family/patient expects to be discharged to:: Unsure Living Arrangements:  Alone                                      Prior Functioning/Environment              Frequency  Min 2X/week        Progress Toward Goals  OT Goals(current goals can now be found in the care plan section)  Progress towards OT goals: Progressing toward goals  Acute Rehab OT Goals Patient Stated Goal: to feel better  OT Goal Formulation: With patient Time For Goal Achievement: 11/26/19 Potential to Achieve Goals: Good  Plan Discharge plan remains appropriate;Frequency remains appropriate    Co-evaluation                 AM-PAC OT "6 Clicks" Daily Activity     Outcome Measure   Help from another person eating meals?: None Help from another person taking care of personal grooming?: A Little Help from another person toileting, which includes using toliet, bedpan, or urinal?: A Lot Help from another person bathing (including washing, rinsing, drying)?: A Lot Help from another person to put on and taking off regular upper body clothing?: A Little Help from another person to put on and taking off regular lower body clothing?: A Lot 6 Click Score: 16    End of Session Equipment Utilized During Treatment: Gait belt;Rolling walker;Cervical collar  OT Visit Diagnosis: Unsteadiness on feet (R26.81);Cognitive communication deficit (R41.841);Pain Pain - Right/Left: Left Pain - part of body:  (  neck)   Activity Tolerance Patient tolerated treatment well   Patient Left in chair;with call bell/phone within reach;with chair alarm set   Nurse Communication Mobility status        Time: 7473-4037 OT Time Calculation (min): 28 min  Charges: OT General Charges $OT Visit: 1 Visit OT Treatments $Self Care/Home Management : 23-37 mins  Audery Amel., COTA/L Acute Rehabilitation Services [phone]    Angelina Pih 11/24/2019, 12:02 PM

## 2019-11-24 NOTE — Progress Notes (Signed)
Subjective: Patient reports that she is experiencing some incisional discomfort as well as continual reports of a sore throat.  Objective: Vital signs in last 24 hours: Temp:  [97.1 F (36.2 C)-98.8 F (37.1 C)] 98.3 F (36.8 C) (11/02 0734) Pulse Rate:  [79-87] 79 (11/02 0734) Resp:  [18-20] 20 (11/02 0734) BP: (124-156)/(52-64) 156/63 (11/02 0734) SpO2:  [98 %-99 %] 98 % (11/02 0734)  Intake/Output from previous day: 11/01 0701 - 11/02 0700 In: 840 [P.O.:840] Out: -  Intake/Output this shift: No intake/output data recorded.  Physical Exam: A/O X 4, conversant, and is in good spirits this morning. She MAEW with good power. Incision is well approximated with no erythema, drainage, edema, or odor.  Lab Results: No results for input(s): WBC, HGB, HCT, PLT in the last 72 hours. BMET No results for input(s): NA, K, CL, CO2, GLUCOSE, BUN, CREATININE, CALCIUM in the last 72 hours.  Studies/Results: No results found.  Assessment/Plan: Patient is continuing to improve. She still has some complaints of incisional and throat discomfort. Continue hard cervical collar when OOB. Continue working with therapies and working with therapies. Awaiting placement.    LOS: 14 days    Council Mechanic, DNP, NP-C 11/24/2019, 8:13 AM

## 2019-11-25 LAB — GLUCOSE, CAPILLARY
Glucose-Capillary: 214 mg/dL — ABNORMAL HIGH (ref 70–99)
Glucose-Capillary: 171 mg/dL — ABNORMAL HIGH (ref 70–99)
Glucose-Capillary: 146 mg/dL — ABNORMAL HIGH (ref 70–99)
Glucose-Capillary: 158 mg/dL — ABNORMAL HIGH (ref 70–99)

## 2019-11-25 NOTE — Evaluation (Signed)
Physical Therapy Evaluation Patient Details Name: [name] MRN: [id number] DOB: [birth year] Today's Date: 11/25/2019   History of Present Illness  Pt is a 74 y/o female admitted secondary to fall, weakness, and numbness in bilateral hands.  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 She underwent C3-4, C4-5, C5-6, C6-7 ACDF on 10/20 PMH includes CVA, HTN, and DM.   Clinical Impression  Pt not feeling well and bringing up the same complaints.  Pt needed some TLC and reinforcement that this is typical.  Emphasis on safety education, transitions, progressing gait with stair training mostly to lower fears.     Follow Up Recommendations Other (comment) (HHPT due to not able to go to SNF)    Equipment Recommendations  Rolling walker with 5" wheels;3in1 (PT)    Recommendations for Other Services       Precautions / Restrictions Precautions Precautions: Fall;Cervical Precaution Booklet Issued: Yes (comment) Precaution Comments: reviewed cervical precautions throughout session, pt with limited carryover needing MAX cues to recall at end of session Required Braces or Orthoses: Cervical Brace Cervical Brace: Hard collar      Mobility  Bed Mobility Overal bed mobility: Needs Assistance Bed Mobility: Supine to Sit;Sit to Supine     Supine to sit: Min guard Sit to supine: Min guard   General bed mobility comments: continue to give pt reinforcement and feedback on her coming straight up versus rolling to get OOB, but pt comes straight up complaining that it hurts each time    Transfers Overall transfer level: Needs assistance Equipment used: Rolling walker (2 wheeled) Transfers: Sit to/from Stand Sit to Stand: Min guard            Ambulation/Gait Ambulation/Gait assistance: Min guard;Min assist Gait Distance (Feet): 280 Feet (3 rest breaks) Assistive device: Rolling walker (2 wheeled) Gait Pattern/deviations:  Step-through pattern Gait velocity: decr   General Gait Details: needed 3 rest breaks  Stairs            Wheelchair Mobility    Modified Rankin (Stroke Patients Only)       Balance Overall balance assessment: Needs assistance Sitting-balance support: Feet supported;No upper extremity supported Sitting balance-Leahy Scale: Good Sitting balance - Comments: much improved overall, though pt wouldn't agree     Standing balance-Leahy Scale: Poor Standing balance comment: still reliant on the RW                             Pertinent Vitals/Pain Pain Assessment: Faces Faces Pain Scale: Hurts little more Pain Location: neck Pain Descriptors / Indicators: Aching;Grimacing;Discomfort Pain Intervention(s): Monitored during session    Home Living                        Prior Function                 Hand Dominance        Extremity/Trunk Assessment                Communication      Cognition Arousal/Alertness: Awake/alert Behavior During Therapy: WFL for tasks assessed/performed Overall Cognitive Status:  (still has little recollection of education)                       Memory: Decreased short-term memory   Safety/Judgement: Decreased awareness of deficits;Decreased awareness of safety Awareness: Emergent  General Comments      Exercises     Assessment/Plan    PT Assessment    PT Problem List         PT Treatment Interventions      PT Goals (Current goals can be found in the Care Plan section)  Acute Rehab PT Goals Patient Stated Goal: to feel better  PT Goal Formulation: With patient Time For Goal Achievement: 12/03/19 Potential to Achieve Goals: Good    Frequency Min 3X/week   Barriers to discharge        Co-evaluation               AM-PAC PT "6 Clicks" Mobility  Outcome Measure Help needed turning from your back to your side while in a flat bed without using bedrails?:  None Help needed moving from lying on your back to sitting on the side of a flat bed without using bedrails?: None Help needed moving to and from a bed to a chair (including a wheelchair)?: A Little Help needed standing up from a chair using your arms (e.g., wheelchair or bedside chair)?: A Little Help needed to walk in hospital room?: A Little Help needed climbing 3-5 steps with a railing? : A Little 6 Click Score: 20    End of Session Equipment Utilized During Treatment: Cervical collar Activity Tolerance: Patient tolerated treatment well Patient left: in bed;with call bell/phone within reach Nurse Communication: Mobility status PT Visit Diagnosis: Unsteadiness on feet (R26.81);Muscle weakness (generalized) (M62.81);History of falling (Z91.81)    Time: 8657-8469 PT Time Calculation (min) (ACUTE ONLY): 20 min   Charges:     PT Treatments $Gait Training: 8-22 mins        11/25/2019  Jacinto Halim., PT Acute Rehabilitation Services [phone]  (pager) [phone]  (office)  Eliseo Gum Jouri Threat 11/25/2019, 12:36 PM

## 2019-11-25 NOTE — Progress Notes (Signed)
Subjective: Patient reports that she is feeling better today and stated that her throat is feeling much better.   Objective: Vital signs in last 24 hours: Temp:  [98.2 F (36.8 C)-98.4 F (36.9 C)] 98.3 F (36.8 C) (11/03 0750) Pulse Rate:  [80-88] 80 (11/03 0750) Resp:  [13-20] 13 (11/03 0750) BP: (146-179)/(51-64) 149/51 (11/03 0750) SpO2:  [95 %-99 %] 98 % (11/03 0750)  Intake/Output from previous day: 11/02 0701 - 11/03 0700 In: 360 [P.O.:360] Out: 0  Intake/Output this shift: No intake/output data recorded.  Physical Exam: A/O X 4, conversant, and is in good spirits this morning. She MAEW with good power. Incision is well approximated with no erythema, drainage, edema, or odor.  Lab Results: No results for input(s): WBC, HGB, HCT, PLT in the last 72 hours. BMET No results for input(s): NA, K, CL, CO2, GLUCOSE, BUN, CREATININE, CALCIUM in the last 72 hours.  Studies/Results: No results found.  Assessment/Plan: Patient is continuing to progress. She reports that she is feeling better overall and is experiencing less incisional discomfort and sore throat. Continue hard cervical collar when OOB. Continue working with therapies and encouraging mobility and ambulation. Awaiting placement.    LOS: 15 days    Council Mechanic 11/25/2019, 9:27 AM

## 2019-11-25 NOTE — TOC Progression Note (Addendum)
Transition of Care [facility]) - Progression Note    Patient Details  Name: [name] MRN: [id number] Date of Birth: 10-Oct-1945  Transition of Care [facility]) CM/SW Contact  Eduard Roux, [place] Phone Number: 11/25/2019, 4:53 PM  Clinical Narrative:     CSW visit with patient at bedside. CSW informed patient, uanble to reach her daughter to follow up on closing pending claims-however, patient was more engaged,able, willing to assist CSW with discharge plan. CSW explained theses claims needs to be address because it can continue to cause placement barriers in the future if SNF is needed. Patient states understanding. CSW was given permission to contact liability claims representatives- 1-[employer]# [phone]- Dina-states the claim is closed and they will send notice to medicare. 2-[employer] # [phone] voice message with contact  Ms Melina Modena- [phone] 3-[employer] # [phone] with Ines Bloomer- he states claim appeared closed on 05/05/2018- however, will need to contact [employer], Oretha Ellis #[phone] left voice message.  After phone calls, patient states her preference is go home. Shared no longer has a interest in going to SNF. Patient states "I will be careful". "I will just take my time and walk". Patient states her granddaughter(79yr) comes over on the weekends to stay with her- "she always helps grandma out". CSW discussed how she will obtain her meals- she states her daughter will make sure she has food, she comes over on the weekends too-she is confident that if she takes her time she can maneuver in the home and rest room appropriately. Patient states she has demonstrated that her with supervision only and little assistance.  CSW discussed,safety- she states she has a home phone and will keep it nearby in the home and near her bed at night.   Patient states she is ready to go home. She informed CSW her daughter rides the bus and is unable to provide  transportation. She would like a taxi- if the driver is able to help her of the car and to her door, making sure not to leave until she is in her home. CSW explained taxi drivers are not allowed to assist patient in/out of the cars. She may need PTAR- CSW will advised, insurance may not cover PTAR. CSW inquired about HH PT, patient reluctant at this time" honey, I don't know, you have to be careful these days. People can stay anything to get into your home". Patient states she will walk around in the home with her granddaughter.  CSW advised will follow up with her tomorrow for her  final decision.  Patient will discharge home CSW will continue to follow and assist with discharge plan CSW will finalize transportation needs tomorrow  Antony Blackbird, MSW, LCSWA Clinical Social Worker     Expected Discharge Plan: Skilled Nursing Facility Barriers to Discharge: Continued Medical Work up, English as a second language teacher, SNF Pending bed offer  Expected Discharge Plan and Services Expected Discharge Plan: Skilled Nursing Facility       Living arrangements for the past 2 months: Single Family Home                                       Social Determinants of Health (SDOH) Interventions    Readmission Risk Interventions No flowsheet data found.

## 2019-11-26 LAB — GLUCOSE, CAPILLARY
Glucose-Capillary: 143 mg/dL — ABNORMAL HIGH (ref 70–99)
Glucose-Capillary: 218 mg/dL — ABNORMAL HIGH (ref 70–99)
Glucose-Capillary: 175 mg/dL — ABNORMAL HIGH (ref 70–99)

## 2019-11-26 NOTE — Discharge Summary (Signed)
Physician Discharge Summary  Patient ID: [name] MRN: [id number] DOB/AGE: January 26, 1945 74 y.o.  Admit date: 11/09/2019 Discharge date: 11/26/2019  Admission Diagnoses:Cervical stenosis, herniated nucleus pulposus with myelopathy, cervical radiculopathy, cervicalgia C 34, C 45, C 56, C 67   Discharge Diagnoses: Cervical stenosis, herniated nucleus pulposus with myelopathy, cervical radiculopathy, cervicalgia C 34, C 45, C 56, C 67   Active Problems:   Cervical stenosis of spine   Discharged Condition: good  Hospital Course: [name] is a 74 y.o. female who was admitted to the hospital secondary to fall, neck pain, numbness and pain in her bilateral hands and LUE. She underwent MRI imaging which revealed severe multilevel degenerated disks with critical spinal stenosis measuring 4 mm with cord compression and cord signal abnormality at the C3/4 level. There was also severe spinal stenosis at the C4/5, C5/6, and C6/7 levels. Severe foraminal stenosis on the left at C4/5, C5/6, and C6/7. On 11/11/2019 she underwent an anterior cervical decompression and fusion of the C3-4, C4-5, C5-6, and C6-7 levels. The patient tolerated the procedure well and was taken to the recovery room and then to the floor in stable condition. The hospital course was routine. There were no complications. The wound remained clean dry and intact. Pt had appropriate neck soreness. The patient remained afebrile with stable vital signs, and tolerated a regular diet. The patient continued to increase activities, and pain was well controlled with oral pain medications.   Consults: None  Significant Diagnostic Studies: radiology: X-Ray  Treatments: surgery: Cervical three-four Cervical four-five Cervical five-six Cervical six-seven  Anterior cervical decompression/discectomy/fusion (N/A) with PEEK cages, autograft, plate  Discharge Exam: Blood pressure (!) 127/54, pulse 80, temperature 98.4 F (36.9 C), resp. rate 16,  height 5\' 4"  (1.626 m), weight 70 kg, SpO2 97 %.   Physical Exam: She is A/O X 4, conversant, and is in good spirits this morning. She MAEW with good power. Incision is well approximated with noerythema, drainage, edema, or odor.  Disposition: Discharge disposition: 01-Home or Self Care        Allergies as of 11/26/2019      Reactions   Codeine Other (See Comments)   REACTION: Hallucinations   Ibuprofen Other (See Comments)   REACTION: Elevated Blood Pressure   Imdur [isosorbide Nitrate] Other (See Comments)   Headache & upset stomach      Medication List    TAKE these medications   albuterol 108 (90 Base) MCG/ACT inhaler Commonly known as: VENTOLIN HFA Inhale 1-2 puffs into the lungs every 6 (six) hours as needed for wheezing or shortness of breath.   amLODipine 10 MG tablet Commonly known as: NORVASC Take 10 mg by mouth daily.   chlorthalidone 25 MG tablet Commonly known as: HYGROTON Take 1 tablet (25 mg total) by mouth daily.   cloNIDine 0.2 MG tablet Commonly known as: CATAPRES Take 1 tablet (0.2 mg total) by mouth 2 (two) times daily.   clopidogrel 75 MG tablet Commonly known as: PLAVIX Take 75 mg by mouth daily.   diclofenac Sodium 1 % Gel Commonly known as: Voltaren Apply 4 g topically 4 (four) times daily.   doxycycline 100 MG capsule Commonly known as: VIBRAMYCIN Take 1 capsule (100 mg total) by mouth 2 (two) times daily.   gabapentin 100 MG capsule Commonly known as: Neurontin Take 1 capsule (100 mg total) by mouth 3 (three) times daily.   glucose blood test strip Use as instructed   hydrALAZINE 25 MG tablet Commonly known as: APRESOLINE  Take 1 tablet (25 mg total) by mouth 3 (three) times daily.   hydrALAZINE 50 MG tablet Commonly known as: APRESOLINE Take 50 mg by mouth 2 (two) times daily.   lidocaine 5 % Commonly known as: Lidoderm Place 1 patch onto the skin daily. Remove & Discard patch within 12 hours or as directed by MD    losartan 50 MG tablet Commonly known as: COZAAR Take 1 tablet (50 mg total) by mouth daily.   metFORMIN 500 MG tablet Commonly known as: GLUCOPHAGE Take 1.5 tablets (750 mg total) by mouth 2 (two) times daily with a meal.   methocarbamol 750 MG tablet Commonly known as: ROBAXIN Take 1 tablet (750 mg total) by mouth 3 (three) times daily as needed (muscle spasm/pain).   Myrbetriq 25 MG Tb24 tablet Generic drug: mirabegron ER Take 25 mg by mouth daily.   potassium chloride 10 MEQ CR capsule Commonly known as: MICRO-K Take 10 mEq by mouth daily.   predniSONE 10 MG tablet Commonly known as: DELTASONE Take 3 tablets (30 mg total) by mouth daily with breakfast.   rosuvastatin 10 MG tablet Commonly known as: CRESTOR Take 10 mg by mouth at bedtime.            Durable Medical Equipment  (From admission, onward)         Start     Ordered   11/26/19 1141  For home use only DME 3 n 1  Once        11/26/19 1140   11/26/19 1141  For home use only DME Walker rolling  Once       Question Answer Comment  Walker: With 5 Inch Wheels   Patient needs a walker to treat with the following condition Weakness      11/26/19 1140          Follow-up Information    [facility], [facility] Follow up.   Why: rolling walker and 3n1 arranged - to be delivered to room prior to discharge Contact [address] [place] [address] [phone]               Signed: Council Mechanic, DNP, NP-C 11/26/2019, 1:38 PM

## 2019-11-26 NOTE — Progress Notes (Signed)
Occupational Therapy Treatment Patient Details Name: [name] MRN: [id number] DOB: 11-Jun-1945 Today's Date: 11/26/2019    History of present illness Pt is a 74 y/o female admitted secondary to fall, weakness, and numbness in bilateral hands.  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 She underwent C3-4, C4-5, C5-6, C6-7 ACDF on 10/20 PMH includes CVA, HTN, and DM.    OT comments  Pt progressing towards acute OT goals. Pt completed household distance functional mobility at min guard level, discussed strategies for safety at home. Remains with cognitive deficits impacting safety (see Cognition section below for further details). D/c plan remains appropriate.   Follow Up Recommendations  Supervision/Assistance - 24 hour;Home health OT;Other (comment);SNF    Equipment Recommendations  3 in 1 bedside commode    Recommendations for Other Services      Precautions / Restrictions Precautions Precautions: Fall;Cervical Precaution Booklet Issued: Yes (comment) Precaution Comments: cueing for cervical precautions, limited carryover. Pt does tend to have guarded movements during OOB activity though. Required Braces or Orthoses: Cervical Brace Cervical Brace: Hard collar (off in bed, off to shower, and off to ambulate to BR ) Restrictions Weight Bearing Restrictions: No       Mobility Bed Mobility Overal bed mobility: Needs Assistance Bed Mobility: Supine to Sit     Supine to sit: Min guard     General bed mobility comments: min guard for safety. internally distracted limiting success with cueing. Completed as supine to sit  Transfers Overall transfer level: Needs assistance Equipment used: Rolling walker (2 wheeled) Transfers: Sit to/from Stand Sit to Stand: Min guard         General transfer comment: min guard for safety. cues for technique with rw. to/from EOB and recliner.    Balance Overall balance assessment:  Needs assistance Sitting-balance support: Feet supported;No upper extremity supported Sitting balance-Leahy Scale: Good     Standing balance support: During functional activity;Bilateral upper extremity supported Standing balance-Leahy Scale: Poor Standing balance comment: able to static stand briefly with single extremity support, rw for dynamic standing                           ADL either performed or assessed with clinical judgement   ADL Overall ADL's : Needs assistance/impaired                         Toilet Transfer: Min guard;RW;Ambulation           Functional mobility during ADLs: Min guard;Rolling walker;Cueing for safety General ADL Comments: cues for safety. discussed strategies for safety at home such as establishing a set time for daughter and her to speak each day to check in. Pt reports she only sponge bathes and has a grandaughter who can assist occasionally "she comes over on Saturdays."     Vision       Perception     Praxis      Cognition Arousal/Alertness: Awake/alert Behavior During Therapy: Flat affect;Anxious;WFL for tasks assessed/performed Overall Cognitive Status: No family/caregiver present to determine baseline cognitive functioning Area of Impairment: Attention;Safety/judgement;Problem solving;Memory;Awareness                   Current Attention Level: Sustained Memory: Decreased short-term memory Following Commands: Follows one step commands consistently;Follows one step commands with increased time Safety/Judgement: Decreased awareness of deficits;Decreased awareness of safety Awareness: Emergent Problem Solving: Slow processing;Difficulty  sequencing;Requires verbal cues;Requires tactile cues General Comments: Pt repeatedly stating she feels she needs wrapping on her shoulders and neck for support, "like ACE wrap." "I know y'al lhave it but nobody will get it for me." OT atempted to explain role of brace for  support of neck and that ACE wrapping is not indicated and could in fact cause harm. Verbal perseveration?        Exercises     Shoulder Instructions       General Comments      Pertinent Vitals/ Pain       Pain Assessment: Faces Faces Pain Scale: Hurts little more Pain Location: neck and B shoulders Pain Descriptors / Indicators: Aching;Sore;Grimacing Pain Intervention(s): Monitored during session;Limited activity within patient's tolerance;Repositioned  Home Living                                          Prior Functioning/Environment              Frequency  Min 2X/week        Progress Toward Goals  OT Goals(current goals can now be found in the care plan section)  Progress towards OT goals: Progressing toward goals  Acute Rehab OT Goals Patient Stated Goal: to feel better  OT Goal Formulation: With patient Time For Goal Achievement: 12/10/19 Potential to Achieve Goals: Good ADL Goals Pt Will Perform Grooming: with min guard assist;standing Pt Will Perform Lower Body Bathing: sit to/from stand;with adaptive equipment;with min guard assist Pt Will Perform Lower Body Dressing: with adaptive equipment;sit to/from stand;with min guard assist Pt Will Transfer to Toilet: with modified independence;ambulating;bedside commode;regular height toilet;grab bars Pt Will Perform Toileting - Clothing Manipulation and hygiene: with min guard assist;sit to/from stand;sitting/lateral leans Additional ADL Goal #1: Pt will be independent with cervical precautions  Plan Discharge plan remains appropriate;Frequency remains appropriate    Co-evaluation                 AM-PAC OT "6 Clicks" Daily Activity     Outcome Measure   Help from another person eating meals?: None Help from another person taking care of personal grooming?: A Little Help from another person toileting, which includes using toliet, bedpan, or urinal?: A Little Help from another  person bathing (including washing, rinsing, drying)?: A Lot Help from another person to put on and taking off regular upper body clothing?: A Little Help from another person to put on and taking off regular lower body clothing?: A Little 6 Click Score: 18    End of Session Equipment Utilized During Treatment: Rolling walker;Cervical collar  OT Visit Diagnosis: Unsteadiness on feet (R26.81);Cognitive communication deficit (R41.841);Pain   Activity Tolerance Patient tolerated treatment well   Patient Left in chair;with call bell/phone within reach;with chair alarm set   Nurse Communication          Time: 8127-5170 OT Time Calculation (min): 28 min  Charges: OT General Charges $OT Visit: 1 Visit OT Treatments $Self Care/Home Management : 23-37 mins  Raynald Kemp, OT Acute Rehabilitation Services Pager: [phone] Office: [phone]    Pilar Grammes 11/26/2019, 1:10 PM

## 2019-11-26 NOTE — Progress Notes (Signed)
Pt discharged to home with self, and refusal of HHPT. At 1236, d/c orders requested from Benita Gutter, NP, who agreed to put them in. Cab voucher received from case manager, Aram Beecham. DME equipment (3N1 and RW) ordered and delivered to bedside. PIV removed.  At 1530, when this RN attempted to print AVS, there were duplicate orders for Hydralazine and no orders for pain medications. The NP had contacted this RN and agreed to give orders for Norco and Robaxin. Pt specified that she would like to use the [facility] on [address] Cab was called, and upon arrival, orders were not ready. CCNS consulted again since orders were not appearing in AVS. Cab service again called to explain situation and agrees to send cab later when pt is ready for d/c. Serita Sheller, called charge RN, Hospital doctor wondering why pt had not been discharged since she had orders all day. This was at about 1700. AVS situation explained to AD, Shelli, who was able to call Ivin Booty, NP and explain the needs for medication adjustment. Shelli contacted NP again at around 1830 since the AVS appeared the same. The duplicated orders for Hydralazine were reconciled. However, the orders for the Norco and Robaxin to be picked up at the pharmacy still were not showing on the AVS. This RN called the [facility] to confirm that the orders were ready for pick-up and they were. This RN then wrote in the orders in pen and educated the pt on discharge summary. Throughout process, pt was upset and expressing concern about getting home after dark, as well as stating that she would not be able to get her belongings into her home without help. The pt also stated that her daughter would not be coming to see her until later this weekend. Despite these concerns, the pt was upset and ready to go home. At one point she pointed to her DME equipment and said, "You can give those to whoever and I'll take my bags and go." Once paperwork was ready, cab service was again called. Pt escorted to cab  in wheelchair with all belongings by 2 NT's at 1910.   Robina Ade, RN

## 2019-11-26 NOTE — Progress Notes (Signed)
Subjective: Patient reports that she is feeling well and is ready to be discharged to home.   Objective: Vital signs in last 24 hours: Temp:  [97.7 F (36.5 C)-98.7 F (37.1 C)] 97.7 F (36.5 C) (11/04 0757) Pulse Rate:  [75-82] 75 (11/04 0757) Resp:  [12-16] 16 (11/04 0757) BP: (125-160)/(53-69) 147/63 (11/04 0757) SpO2:  [96 %-100 %] 96 % (11/04 0757)  Intake/Output from previous day: 11/03 0701 - 11/04 0700 In: 457 [P.O.:457] Out: -  Intake/Output this shift: No intake/output data recorded.  Physical Exam: She is A/O X 4, conversant, and is in good spirits this morning. She MAEW with good power. Incision is well approximated with noerythema, drainage, edema, or odor.  Lab Results: No results for input(s): WBC, HGB, HCT, PLT in the last 72 hours. BMET No results for input(s): NA, K, CL, CO2, GLUCOSE, BUN, CREATININE, CALCIUM in the last 72 hours.  Studies/Results: No results found.  Assessment/Plan: Patient is continuing to progress and is doing well. She reports that she is ambulating well and feels confident that she is ready to be discharged home. Continue hard cervical collar when OOB. Continue working with therapies and encouraging mobility and ambulation. Awaiting CSW decision on transporting patient home. Appreciate CSW's help with this patient.Will plan to discharge once patient has a safe way to be transported from the hospital to her home.   LOS: 16 days     Council Mechanic, DNP, NP-C 11/26/2019, 8:05 AM

## 2019-11-26 NOTE — TOC Progression Note (Signed)
Transition of Care [facility]) - Progression Note    Patient Details  Name: [name] MRN: [id number] Date of Birth: 1945-04-20  Transition [facility]) CM/SW Contact  Eduard Roux, [place] Phone Number: 11/26/2019, 11:41 AM  Clinical Narrative:     CSW visit with patient. Patient states she was comfortable with [employer] [facility] for transportation home. Patient declined HH but was agreeable to receive DME-BSC and walker. RNCM will request DME. CSW gave RN taxi voucher for transportation.   Antony Blackbird, MSW, LCSWA Clinical Social Worker     Expected Discharge Plan: Skilled Nursing Facility Barriers to Discharge: Continued Medical Work up, English as a second language teacher, SNF Pending bed offer  Expected Discharge Plan and Services Expected Discharge Plan: Skilled Nursing Facility       Living arrangements for the past 2 months: Single Family Home                                       Social Determinants of Health (SDOH) Interventions    Readmission Risk Interventions No flowsheet data found.

## 2019-11-26 NOTE — TOC Transition Note (Signed)
Transition of Care (TOC) - CM/SW Discharge Note Donn Pierini RN,BSN Transitions of Care Unit 4NP (non trauma) - RN Case Manager See Treatment Team for direct Phone #   Patient Details  Name: [name] MRN: [id number] Date of Birth: 21-Aug-1945  Transition of Care [facility]) CM/SW Contact:  Darrold Span, RN Phone Number: 11/26/2019, 2:25 PM   Clinical Narrative:    Pt stable for transition home, per CSW has declined [facility] services- DME has been ordered from [employer] line- and is to be delivered to room prior to discharge- RW and 3n1.     Final next level of care: Home/Self Care Barriers to Discharge: Barriers Resolved   Patient Goals and CMS Choice    N/A    Discharge Placement               home        Discharge Plan and Services In-house Referral: Clinical Social Work Discharge Planning Services: CM Consult Post Acute Care Choice: Durable Medical Equipment, Home Health          DME Arranged: 3-N-1, Walker rolling DME Agency: [employer] Date DME Agency Contacted: 11/26/19 Time DME Agency Contacted: 1145 Representative spoke with at DME Agency: Silvio Pate HH Arranged: Refused HH HH Agency: NA        Social Determinants of Health (SDOH) Interventions     Readmission Risk Interventions Readmission Risk Prevention Plan 11/26/2019  Transportation Screening Complete  Medication Review Oceanographer) Complete  PCP or Specialist appointment within 3-5 days of discharge Complete  HRI or Home Care Consult Complete  SW Recovery Care/Counseling Consult Complete  Palliative Care Screening Not Applicable  Skilled Nursing Facility Not Applicable  Some recent data might be hidden

## 2019-11-26 NOTE — Progress Notes (Signed)
Physical Therapy Treatment Patient Details Name: [name] MRN: [id number] DOB: [birth year] Today's Date: 11/26/2019    History of Present Illness Pt is a 74 y/o female admitted secondary to fall, weakness, and numbness in bilateral hands.  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 She underwent C3-4, C4-5, C5-6, C6-7 ACDF on 10/20 PMH includes CVA, HTN, and DM.     PT Comments    Pt not feeling totally ready for d/c.  She still has trouble remembering the education, but general mobility should be safe.  Reinforced all education again.    Follow Up Recommendations  Other (comment) (HHPT due to not able to go to SNF)     Equipment Recommendations  Rolling walker with 5" wheels;3in1 (PT)    Recommendations for Other Services       Precautions / Restrictions Precautions Precautions: Fall;Cervical Precaution Booklet Issued: Yes (comment) Precaution Comments: reviewed cervical precautions throughout session, pt with limited carryover needing MAX cues to recall at end of session Required Braces or Orthoses: Cervical Brace Cervical Brace: Hard collar Restrictions Weight Bearing Restrictions: No    Mobility  Bed Mobility Overal bed mobility: Needs Assistance Bed Mobility: Supine to Sit     Supine to sit: Min guard     General bed mobility comments: pt at EOB on arrival  Transfers Overall transfer level: Needs assistance Equipment used: Rolling walker (2 wheeled) Transfers: Sit to/from Stand Sit to Stand: Supervision         General transfer comment: pt standing more fluidly with less thought to falling.  Ambulation/Gait Ambulation/Gait assistance: Supervision   Assistive device: Rolling walker (2 wheeled) Gait Pattern/deviations: Step-through pattern Gait velocity: decr Gait velocity interpretation: <1.8 ft/sec, indicate of risk for recurrent falls General Gait Details: pt generally steady, appears more  comfortable, less guarded   Stairs Stairs: Yes Stairs assistance: Min guard;Supervision Stair Management: One rail Left;Alternating pattern;Forwards Number of Stairs: 10 General stair comments: safe with rail   Wheelchair Mobility    Modified Rankin (Stroke Patients Only)       Balance Overall balance assessment: Needs assistance Sitting-balance support: Feet supported;No upper extremity supported Sitting balance-Leahy Scale: Good Sitting balance - Comments: much improved overall, though pt wouldn't agree   Standing balance support: During functional activity;Bilateral upper extremity supported Standing balance-Leahy Scale: Poor Standing balance comment: still reliant on the RW                            Cognition Arousal/Alertness: Awake/alert Behavior During Therapy: WFL for tasks assessed/performed Overall Cognitive Status:  (still has little recollection of education) Area of Impairment: Attention;Safety/judgement;Problem solving;Memory;Awareness                   Current Attention Level: Sustained Memory: Decreased short-term memory Following Commands: Follows one step commands consistently;Follows one step commands with increased time Safety/Judgement: Decreased awareness of deficits;Decreased awareness of safety Awareness: Emergent Problem Solving: Slow processing;Difficulty sequencing;Requires verbal cues;Requires tactile cues General Comments: Pt repeatedly stating she feels she needs wrapping on her shoulders and neck for support, "like ACE wrap." "I know y'al lhave it but nobody will get it for me." OT atempted to explain role of brace for support of neck and that ACE wrapping is not indicated and could in fact cause harm. Verbal perseveration?      Exercises      General Comments General comments (skin integrity, edema, etc.):  reinforced once again all cervical education and stress on donning/doffing the collar.      Pertinent  Vitals/Pain Pain Assessment: Faces Faces Pain Scale: Hurts little more Pain Location: neck Pain Descriptors / Indicators: Aching;Grimacing;Discomfort Pain Intervention(s): Monitored during session    Home Living                      Prior Function            PT Goals (current goals can now be found in the care plan section) Acute Rehab PT Goals Patient Stated Goal: to feel better  PT Goal Formulation: With patient Time For Goal Achievement: 12/03/19 Potential to Achieve Goals: Good Progress towards PT goals: Progressing toward goals    Frequency    Min 3X/week      PT Plan Current plan remains appropriate    Co-evaluation              AM-PAC PT "6 Clicks" Mobility   Outcome Measure  Help needed turning from your back to your side while in a flat bed without using bedrails?: None Help needed moving from lying on your back to sitting on the side of a flat bed without using bedrails?: None Help needed moving to and from a bed to a chair (including a wheelchair)?: A Little Help needed standing up from a chair using your arms (e.g., wheelchair or bedside chair)?: A Little Help needed to walk in hospital room?: A Little Help needed climbing 3-5 steps with a railing? : A Little 6 Click Score: 20    End of Session Equipment Utilized During Treatment: Cervical collar Activity Tolerance: Patient tolerated treatment well Patient left: in bed;with call bell/phone within reach Nurse Communication: Mobility status PT Visit Diagnosis: Unsteadiness on feet (R26.81);Muscle weakness (generalized) (M62.81);History of falling (Z91.81)     Time: 1610-9604 PT Time Calculation (min) (ACUTE ONLY): 27 min  Charges:  $Gait Training: 8-22 mins $Self Care/Home Management: 8-22                     11/26/2019  Jacinto Halim., PT Acute Rehabilitation Services [phone]  (pager) [phone]  (office)   Eliseo Gum Leevon Upperman 11/26/2019, 3:31 PM

## 2019-11-27 ENCOUNTER — Emergency Department (HOSPITAL_COMMUNITY): Payer: Medicare HMO

## 2019-11-27 ENCOUNTER — Encounter (HOSPITAL_COMMUNITY): Payer: Self-pay

## 2019-11-27 ENCOUNTER — Emergency Department (HOSPITAL_COMMUNITY)
Admission: EM | Admit: 2019-11-27 | Discharge: 2019-11-28 | Disposition: A | Discharge status: Home / Self Care | Payer: Medicare HMO | Attending: Emergency Medicine | Admitting: Emergency Medicine

## 2019-11-27 DIAGNOSIS — Z7984 Long term (current) use of oral hypoglycemic drugs: Secondary | ICD-10-CM | POA: Diagnosis not present

## 2019-11-27 DIAGNOSIS — R079 Chest pain, unspecified: Secondary | ICD-10-CM | POA: Diagnosis present

## 2019-11-27 DIAGNOSIS — Z7901 Long term (current) use of anticoagulants: Secondary | ICD-10-CM | POA: Diagnosis not present

## 2019-11-27 DIAGNOSIS — Z20822 Contact with and (suspected) exposure to covid-19: Secondary | ICD-10-CM | POA: Diagnosis not present

## 2019-11-27 DIAGNOSIS — Z79899 Other long term (current) drug therapy: Secondary | ICD-10-CM | POA: Insufficient documentation

## 2019-11-27 DIAGNOSIS — E119 Type 2 diabetes mellitus without complications: Secondary | ICD-10-CM | POA: Diagnosis not present

## 2019-11-27 DIAGNOSIS — I10 Essential (primary) hypertension: Secondary | ICD-10-CM | POA: Diagnosis not present

## 2019-11-27 LAB — BASIC METABOLIC PANEL
Anion gap: 15 (ref 5–15)
BUN: 12 mg/dL (ref 8–23)
CO2: 27 mmol/L (ref 22–32)
Calcium: 9.2 mg/dL (ref 8.9–10.3)
Chloride: 94 mmol/L — ABNORMAL LOW (ref 98–111)
Creatinine, Ser: 1.02 mg/dL — ABNORMAL HIGH (ref 0.44–1.00)
GFR, Estimated: 58 mL/min — ABNORMAL LOW (ref >60)
Glucose, Bld: 248 mg/dL — ABNORMAL HIGH (ref 70–99)
Potassium: 3.4 mmol/L — ABNORMAL LOW (ref 3.5–5.1)
Sodium: 136 mmol/L (ref 135–145)

## 2019-11-27 LAB — HEPATIC FUNCTION PANEL
ALT: 11 U/L (ref 0–44)
AST: 18 U/L (ref 15–41)
Albumin: 3.5 g/dL (ref 3.5–5.0)
Alkaline Phosphatase: 99 U/L (ref 38–126)
Bilirubin, Direct: <0.1 mg/dL (ref 0.0–0.2)
Total Bilirubin: 0.4 mg/dL (ref 0.3–1.2)
Total Protein: 7.7 g/dL (ref 6.5–8.1)

## 2019-11-27 LAB — CBC
HCT: 32 % — ABNORMAL LOW (ref 36.0–46.0)
Hemoglobin: 10.3 g/dL — ABNORMAL LOW (ref 12.0–15.0)
MCH: 30.1 pg (ref 26.0–34.0)
MCHC: 32.2 g/dL (ref 30.0–36.0)
MCV: 93.6 fL (ref 80.0–100.0)
Platelets: 373 10*3/uL (ref 150–400)
RBC: 3.42 MIL/uL — ABNORMAL LOW (ref 3.87–5.11)
RDW: 12.5 % (ref 11.5–15.5)
WBC: 10 10*3/uL (ref 4.0–10.5)
nRBC: 0 % (ref 0.0–0.2)

## 2019-11-27 LAB — TROPONIN I (HIGH SENSITIVITY)
Troponin I (High Sensitivity): 9 ng/L (ref <18)
Troponin I (High Sensitivity): 11 ng/L (ref <18)

## 2019-11-27 LAB — RESPIRATORY PANEL BY RT PCR (FLU A&B, COVID)
Influenza A by PCR: NEGATIVE
Influenza B by PCR: NEGATIVE
SARS Coronavirus 2 by RT PCR: NEGATIVE

## 2019-11-27 LAB — LIPASE, BLOOD: Lipase: 23 U/L (ref 11–51)

## 2019-11-27 LAB — D-DIMER, QUANTITATIVE: D-Dimer, Quant: 3.1 ug/mL-FEU — ABNORMAL HIGH (ref 0.00–0.50)

## 2019-11-27 MED ORDER — IOHEXOL 350 MG/ML SOLN
75.0000 mL | Freq: Once | INTRAVENOUS | Status: AC | PRN
  Administered 2019-11-27: 69 mL via INTRAVENOUS

## 2019-11-27 MED ORDER — OXYCODONE-ACETAMINOPHEN 5-325 MG PO TABS
1.0000 | ORAL_TABLET | ORAL | 0 refills | Status: DC | PRN
Start: 2019-11-27 — End: 2020-04-18

## 2019-11-27 MED ORDER — OXYCODONE-ACETAMINOPHEN 5-325 MG PO TABS
1.0000 | ORAL_TABLET | Freq: Once | ORAL | Status: AC
  Administered 2019-11-27: 1 via ORAL
  Filled 2019-11-27: qty 1

## 2019-11-27 MED ORDER — LABETALOL HCL 5 MG/ML IV SOLN
10.0000 mg | Freq: Once | INTRAVENOUS | Status: AC
  Administered 2019-11-27: 10 mg via INTRAVENOUS
  Filled 2019-11-27: qty 4

## 2019-11-27 MED ORDER — FENTANYL CITRATE (PF) 100 MCG/2ML IJ SOLN
50.0000 ug | Freq: Once | INTRAMUSCULAR | Status: AC
  Administered 2019-11-27: 50 ug via INTRAVENOUS
  Filled 2019-11-27: qty 2

## 2019-11-27 NOTE — ED Triage Notes (Signed)
Pt comes via [facility] for CP and SOB, PTA pt received one nitro and 324 ASA with some relief, pt hypertensive with EMS.

## 2019-11-27 NOTE — Discharge Instructions (Signed)
Your work-up today did not reveal an emergent cause of the chest pain you are experiencing.  There is no evidence of blood clot or pneumonia.  Your labs were reassuring.  I suspect you are having more postoperative pain from your recent surgery.  As we discussed, you will get a prescription for some pain medicine and you need to call your surgeon to discuss further pain management going forward.  Please follow-up with them.  Please rest and stay hydrated.  If any symptoms change or worsen, was return to the nearest emergency department.

## 2019-11-27 NOTE — ED Provider Notes (Addendum)
[facility] EMERGENCY DEPARTMENT Provider Note   CSN: [id number] Arrival date & time: 11/27/19  1914     History Chief Complaint  Patient presents with  . Chest Pain    [name] is a 74 y.o. female.  The history is provided by the patient and medical records. No language interpreter was used.  Chest Pain Pain location:  Substernal area Pain quality: aching, crushing and pressure   Pain radiates to:  Does not radiate Pain severity:  Severe Onset quality:  Unable to specify Timing:  Constant Progression:  Unchanged Chronicity:  New Relieved by:  Nothing Worsened by:  Deep breathing Associated symptoms: fatigue and shortness of breath   Associated symptoms: no abdominal pain, no altered mental status, no back pain, no cough, no diaphoresis, no dizziness, no fever, no headache, no lower extremity edema, no nausea, no palpitations, no syncope and no vomiting   Risk factors: hypertension and surgery   Risk factors: not female        Past Medical History:  Diagnosis Date  . Diabetes mellitus without complication (HCC)   . GERD 09/04/2006   Qualifier: Diagnosis of  By: Duke Salvia    . History of echocardiogram    a. 2D ECHO: 11/06/2013 EF 65%; no WMA. Mild TR. PA pk pressure 43 mm HG  . Hypertension   . Stroke [facility])     Patient Active Problem List   Diagnosis Date Noted  . Cervical stenosis of spine 11/10/2019  . Acute respiratory disease due to COVID-19 virus 05/23/2018  . NSTEMI (non-ST elevated myocardial infarction) (HCC) 11/05/2013  . Spinal stenosis of cervical region 10/20/2012  . Prolonged QT interval 10/18/2012  . Left sided numbness 10/18/2012  . Palpitations 10/18/2012  . 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 Numbness 06/20/2012  . Diabetes mellitus (HCC) 06/20/2012  . CVA (cerebral infarction) 06/20/2012  . Hypokalemia 06/20/2012  . ARTHRALGIA 01/28/2009  . CHEST PAIN UNSPECIFIED 11/26/2008  . DEPRESSION 09/04/2006    . Essential hypertension 09/04/2006  . GERD 09/04/2006  . MULTIPLE SITES, ARTHRITIS-ACUTE/CHRONIC 09/04/2006  . LOW BACK PAIN, CHRONIC 09/04/2006    Past Surgical History:  Procedure Laterality Date  . ABDOMINAL HYSTERECTOMY    . ANTERIOR CERVICAL DECOMPRESSION/DISCECTOMY FUSION 4 LEVELS N/A 11/11/2019   Procedure: Cervical three-four Cervical four-five Cervical five-six Cervical six-seven  Anterior cervical decompression/discectomy/fusion;  Surgeon: Maeola Harman, MD;  Location: [facility] OR;  Service: Neurosurgery;  Laterality: N/A;  . LEFT HEART CATHETERIZATION WITH CORONARY ANGIOGRAM N/A 11/05/2013   Procedure: LEFT HEART CATHETERIZATION WITH CORONARY ANGIOGRAM;  Surgeon: Lennette Bihari, MD;  Location: [facility] CATH LAB;  Service: Cardiovascular;  Laterality: N/A;     OB History   No obstetric history on file.     No family history on file.  Social History   Tobacco Use  . Smoking status: Never Smoker  . Smokeless tobacco: Never Used  Substance Use Topics  . Alcohol use: No  . Drug use: No    Home Medications Prior to Admission medications   Medication Sig Start Date End Date Taking? Authorizing Provider  albuterol (VENTOLIN HFA) 108 (90 Base) MCG/ACT inhaler Inhale 1-2 puffs into the lungs every 6 (six) hours as needed for wheezing or shortness of breath. 10/20/19   Wallis Bamberg, PA-C  amLODipine (NORVASC) 10 MG tablet Take 10 mg by mouth daily.    [provider]  chlorthalidone (HYGROTON) 25 MG tablet Take 1 tablet (25 mg total) by mouth daily. 10/20/19  Wallis Bamberg, PA-C  cloNIDine (CATAPRES) 0.2 MG tablet Take 1 tablet (0.2 mg total) by mouth 2 (two) times daily. 10/20/19   Wallis Bamberg, PA-C  clopidogrel (PLAVIX) 75 MG tablet Take 75 mg by mouth daily. 03/16/19   [provider]  diclofenac Sodium (VOLTAREN) 1 % GEL Apply 4 g topically 4 (four) times daily. 09/30/19   Palumbo, April, MD  doxycycline (VIBRAMYCIN) 100 MG capsule Take 1 capsule (100 mg total) by mouth  2 (two) times daily. Patient not taking: Reported on 11/10/2019 04/11/19   Army Melia A, PA-C  gabapentin (NEURONTIN) 100 MG capsule Take 1 capsule (100 mg total) by mouth 3 (three) times daily. 11/01/19 12/31/19  Sabas Sous, MD  glucose blood test strip Use as instructed 04/11/19   Army Melia A, PA-C  hydrALAZINE (APRESOLINE) 50 MG tablet Take 50 mg by mouth 2 (two) times daily. 03/22/19   [provider]  lidocaine (LIDODERM) 5 % Place 1 patch onto the skin daily. Remove & Discard patch within 12 hours or as directed by MD Patient not taking: Reported on 11/10/2019 09/30/19   Palumbo, April, MD  losartan (COZAAR) 50 MG tablet Take 1 tablet (50 mg total) by mouth daily. 10/20/19   Wallis Bamberg, PA-C  metFORMIN (GLUCOPHAGE) 500 MG tablet Take 1.5 tablets (750 mg total) by mouth 2 (two) times daily with a meal. 10/27/19 11/26/19  Geoffery Lyons, MD  methocarbamol (ROBAXIN) 750 MG tablet Take 1 tablet (750 mg total) by mouth 3 (three) times daily as needed (muscle spasm/pain). 10/13/19   Cathren Laine, MD  MYRBETRIQ 25 MG TB24 tablet Take 25 mg by mouth daily. 02/03/18   [provider]  potassium chloride (MICRO-K) 10 MEQ CR capsule Take 10 mEq by mouth daily. 03/16/19   [provider]  predniSONE (DELTASONE) 10 MG tablet Take 3 tablets (30 mg total) by mouth daily with breakfast. Patient not taking: Reported on 11/10/2019 10/20/19   Wallis Bamberg, PA-C  rosuvastatin (CRESTOR) 10 MG tablet Take 10 mg by mouth at bedtime. 03/22/19   [provider]    Allergies    Codeine, Ibuprofen, and Imdur [isosorbide nitrate]  Review of Systems   Review of Systems  Constitutional: Positive for fatigue. Negative for chills, diaphoresis and fever.  HENT: Negative for congestion.   Eyes: Negative for visual disturbance.  Respiratory: Positive for shortness of breath. Negative for cough, chest tightness and wheezing.   Cardiovascular: Positive for chest pain. Negative for  palpitations, leg swelling and syncope.  Gastrointestinal: Negative for abdominal pain, constipation, diarrhea, nausea and vomiting.  Genitourinary: Negative for flank pain and frequency.  Musculoskeletal: Positive for neck pain (from surgery). Negative for back pain and neck stiffness.  Neurological: Negative for dizziness, light-headedness and headaches.  Psychiatric/Behavioral: Negative for agitation.  All other systems reviewed and are negative.   Physical Exam Updated Vital Signs BP (!) 182/64   Pulse 95   Temp 98.9 F (37.2 C) (Temporal)   Resp 20   SpO2 99%   Physical Exam Vitals and nursing note reviewed.  Constitutional:      General: She is not in acute distress.    Appearance: She is well-developed. She is not ill-appearing, toxic-appearing or diaphoretic.  HENT:     Head: Normocephalic and atraumatic.  Eyes:     Conjunctiva/sclera: Conjunctivae normal.     Pupils: Pupils are equal, round, and reactive to light.  Neck:   Cardiovascular:     Rate and Rhythm: Normal rate and  regular rhythm.     Heart sounds: Normal heart sounds. No murmur heard.   Pulmonary:     Effort: Pulmonary effort is normal. No tachypnea, accessory muscle usage or respiratory distress.     Breath sounds: Normal breath sounds. No decreased breath sounds, wheezing, rhonchi or rales.  Chest:     Chest wall: Tenderness present.  Abdominal:     Palpations: Abdomen is soft.     Tenderness: There is no abdominal tenderness.  Musculoskeletal:     Cervical back: Neck supple.     Right lower leg: No tenderness. No edema.     Left lower leg: No tenderness. No edema.  Skin:    General: Skin is warm and dry.     Capillary Refill: Capillary refill takes less than 2 seconds.     Findings: No erythema.  Neurological:     Mental Status: She is alert.  Psychiatric:        Mood and Affect: Mood normal.     ED Results / Procedures / Treatments   Labs (all labs ordered are listed, but only  abnormal results are displayed) Labs Reviewed  BASIC METABOLIC PANEL - Abnormal; Notable for the following components:      Result Value   Potassium 3.4 (*)    Chloride 94 (*)    Glucose, Bld 248 (*)    Creatinine, Ser 1.02 (*)    GFR, Estimated 58 (*)    All other components within normal limits  CBC - Abnormal; Notable for the following components:   RBC 3.42 (*)    Hemoglobin 10.3 (*)    HCT 32.0 (*)    All other components within normal limits  D-DIMER, QUANTITATIVE (NOT AT [facility]) - Abnormal; Notable for the following components:   D-Dimer, Quant 3.10 (*)    All other components within normal limits  RESPIRATORY PANEL BY RT PCR (FLU A&B, COVID)  HEPATIC FUNCTION PANEL  LIPASE, BLOOD  TROPONIN I (HIGH SENSITIVITY)  TROPONIN I (HIGH SENSITIVITY)    EKG EKG Interpretation  Date/Time:  Friday November 27 2019 19:25:15 EDT Ventricular Rate:  96 PR Interval:    QRS Duration: 71 QT Interval:  333 QTC Calculation: 421 R Axis:   64 Text Interpretation: Sinus rhythm Borderline short PR interval When compared to prior, no significant changes seen, No STEMI Confirmed by Theda Belfast ([id number]) on 11/27/2019 7:37:30 PM   Radiology DG Chest 2 View  Result Date: 11/27/2019 CLINICAL DATA:  Chest pain and shortness of breath. EXAM: CHEST - 2 VIEW COMPARISON:  Chest x-ray dated November 10, 2019. FINDINGS: The heart size and mediastinal contours are within normal limits. Both lungs are clear. The visualized skeletal structures are unremarkable. IMPRESSION: No active cardiopulmonary disease. Electronically Signed   By: Obie Dredge M.D.   On: 11/27/2019 20:29   CT Angio Chest PE W and/or Wo Contrast  Result Date: 11/27/2019 CLINICAL DATA:  Shortness of breath, positive D-dimer. EXAM: CT ANGIOGRAPHY CHEST WITH CONTRAST TECHNIQUE: Multidetector CT imaging of the chest was performed using the standard protocol during bolus administration of intravenous contrast. Multiplanar CT image  reconstructions and MIPs were obtained to evaluate the vascular anatomy. CONTRAST:  54mL OMNIPAQUE IOHEXOL 350 MG/ML SOLN COMPARISON:  09/11/2014 FINDINGS: Cardiovascular: No filling defects in the pulmonary arteries to suggest pulmonary emboli. Aortic atherosclerosis. No aneurysm. Heart is normal size. Mediastinum/Nodes: No mediastinal, hilar, or axillary adenopathy. Trachea and esophagus are unremarkable. Thyroid unremarkable. Lungs/Pleura: Lungs are clear. No focal airspace opacities or suspicious  nodules. No effusions. Upper Abdomen: Imaging into the upper abdomen demonstrates no acute findings. Calcifications in the spleen compatible with old granulomatous disease. Musculoskeletal: Chest wall soft tissues are unremarkable. No acute bony abnormality. Review of the MIP images confirms the above findings. IMPRESSION: No evidence of pulmonary embolus. No acute cardiopulmonary disease. Aortic Atherosclerosis (ICD10-I70.0). Electronically Signed   By: Charlett NoseKevin  Dover M.D.   On: 11/27/2019 21:09    Procedures Procedures (including critical care time)  Medications Ordered in ED Medications  oxyCODONE-acetaminophen (PERCOCET/ROXICET) 5-325 MG per tablet 1 tablet (has no administration in time range)  fentaNYL (SUBLIMAZE) injection 50 mcg (50 mcg Intravenous Given 11/27/19 1955)  labetalol (NORMODYNE) injection 10 mg (10 mg Intravenous Given 11/27/19 1957)  iohexol (OMNIPAQUE) 350 MG/ML injection 75 mL (69 mLs Intravenous Contrast Given 11/27/19 2104)    ED Course  I have reviewed the triage vital signs and the nursing notes.  Pertinent labs & imaging results that were available during my care of the patient were reviewed by me and considered in my medical decision making (see chart for details).    MDM Rules/Calculators/A&P                          Adriana JumboWilma [name] is a 74 y.o. female with a past medical history significant for hypertension, diabetes, prior CAD with NSTEMI, and cervical disease status post  cervical spine surgery 16 days ago who presents with chest pain.  Patient reports that she has had chest pain for the last day or 2 that feels like a pressure in someone standing on her central chest.  She reports it does not radiate.  She reports she continues to have some pain in the left side of her body since her neck surgery.  She says that she does not member when she was discharged but chart review shows that she was discharged yesterday.  Patient reports that she had shortness of breath and does report some pain worsened when she takes deep breath.  She denies history of DVT or PE.  She denies any urinary symptoms, GI symptoms, diaphoresis, lightheadedness, or syncope.  She denies any new headache.  She reports no drainage from the wound or other wound concerns at this time.  EMS reports patient blood pressure has been elevated.  Patient took 4 aspirin after speaking with EMS and then was given a nitro on the way to the emergency department.  Initially she was attended 10 chest pain then moved 8 out of 10 and is now back 10 out of 10.  She reports no other symptoms.  On exam, patient's chest is tender to palpation.  Lungs were clear on my exam.  Back was nontender.  No tenderness in the posterior neck.  Patient has sensation and strength in all extremities.  Good pulses in lower extremities.  No lower extremity tenderness or edema seen.  Patient's vital signs did show some hypertension on arrival but otherwise she was afebrile and not tachycardic.  EKG does not show STEMI.   Clinically I am somewhat concerned about the patient's chest discomfort.  I am also concerned with her surgery several weeks ago and this new pleuritic chest pain and shortness of breath.  The pain does not go straight through to her back, and does not go to her abdomen.  Low suspicion for dissection and more suspicion for pulmonary embolism.  We will get screening work-up to assess for concerning cause of her chest discomfort.  As her wound appears well and her neck is not erythematous and her pain is primarily in her chest, low suspicion for postoperative complication in the anterior neck at this time.  She will be given some pain medicine.  Anticipate reassessment after work-up to determine disposition.  11:05 PM Patient's work-up returned reassuring.  Her troponin was negative x2.  CT PE study did not show any evidence of pulmonary ballismus.  Kidney function liver function similar to prior and reassuring.  I did not find any concerning etiologies of the patient's chest discomfort tonight.  Patient then started focusing on some of the chronic pain she has been having ever since her surgery in her extremities.  Patient reports that the pain medicine she was given was not helping further.  We will give her a dose of Percocet here and a prescription for a short course of it and she can call her surgeon tomorrow or Monday to discuss further postoperative pain management.  Do not find any concerning reason to admit and patient agrees.  Patient will be discharged for outpatient follow-up.  11:52 PM As patient was being discharged, she reported she was having some dysuria to nursing.  She reported it was new.  We will get a urinalysis sent off.  Care transferred to oncoming team awaiting for results of urinalysis.  If there is UTI, dissipate antibiotics if it is negative, dissipate discharge home for outpatient follow-up as previously discussed.   Final Clinical Impression(s) / ED Diagnoses Final diagnoses:  Chest pain, unspecified type    Rx / DC Orders ED Discharge Orders         Ordered    oxyCODONE-acetaminophen (PERCOCET/ROXICET) 5-325 MG tablet  Every 4 hours PRN        11/27/19 2309          Clinical Impression: 1. Chest pain, unspecified type     Disposition: Discharge  Condition: Good  I have discussed the results, Dx and Tx plan with the pt(& family if present). He/she/they expressed understanding  and agree(s) with the plan. Discharge instructions discussed at great length. Strict return precautions discussed and pt &/or family have verbalized understanding of the instructions. No further questions at time of discharge.    New Prescriptions   OXYCODONE-ACETAMINOPHEN (PERCOCET/ROXICET) 5-325 MG TABLET    Take 1 tablet by mouth every 4 (four) hours as needed.    Follow Up: No follow-up provider specified.    Rola Lennon, Canary Brim, MD 11/27/19 2309    Maloree Uplinger, Canary Brim, MD 11/27/19 [phone]

## 2019-11-28 LAB — URINALYSIS, ROUTINE W REFLEX MICROSCOPIC
Bilirubin Urine: NEGATIVE
Glucose, UA: NEGATIVE mg/dL
Hgb urine dipstick: NEGATIVE
Ketones, ur: NEGATIVE mg/dL
Leukocytes,Ua: NEGATIVE
Nitrite: NEGATIVE
Protein, ur: NEGATIVE mg/dL
Specific Gravity, Urine: 1.028 (ref 1.005–1.030)
pH: 6 (ref 5.0–8.0)

## 2019-11-29 LAB — URINE CULTURE: Culture: <10000 — AB

## 2019-12-06 NOTE — Social Work (Signed)
11-30-2019 note is a follow call from previous admission- CSW received a call from [employer] - Claims representative - Priscille Kluver- states the patient's claim is not active, however, she has not exhausted her benefits for the claim-she has not reached the  amount covered for her medical expenses related to this claim- she advised they will update Medicare.   Antony Blackbird, MSW, LCSWA Clinical Social Worker

## 2019-12-10 ENCOUNTER — Ambulatory Visit (HOSPITAL_COMMUNITY)
Admission: EM | Admit: 2019-12-10 | Discharge: 2019-12-10 | Disposition: A | Discharge status: Home / Self Care | Payer: Medicare HMO | Attending: Urgent Care | Admitting: Urgent Care

## 2019-12-10 ENCOUNTER — Encounter (HOSPITAL_COMMUNITY): Payer: Self-pay | Admitting: Emergency Medicine

## 2019-12-10 ENCOUNTER — Other Ambulatory Visit: Payer: Self-pay

## 2019-12-10 DIAGNOSIS — M542 Cervicalgia: Secondary | ICD-10-CM

## 2019-12-10 DIAGNOSIS — G8929 Other chronic pain: Secondary | ICD-10-CM

## 2019-12-10 DIAGNOSIS — I10 Essential (primary) hypertension: Secondary | ICD-10-CM

## 2019-12-10 DIAGNOSIS — I252 Old myocardial infarction: Secondary | ICD-10-CM

## 2019-12-10 DIAGNOSIS — Z8673 Personal history of transient ischemic attack (TIA), and cerebral infarction without residual deficits: Secondary | ICD-10-CM

## 2019-12-10 DIAGNOSIS — R202 Paresthesia of skin: Secondary | ICD-10-CM

## 2019-12-10 MED ORDER — LOSARTAN POTASSIUM 50 MG PO TABS: 50.0000 mg | ORAL_TABLET | Freq: Every day | ORAL | 0 refills | Status: DC

## 2019-12-10 MED ORDER — CHLORTHALIDONE 25 MG PO TABS: 25.0000 mg | ORAL_TABLET | Freq: Every day | ORAL | 0 refills | Status: DC

## 2019-12-10 MED ORDER — CLONIDINE HCL 0.2 MG PO TABS: 0.2000 mg | ORAL_TABLET | Freq: Two times a day (BID) | ORAL | 0 refills | Status: DC

## 2019-12-10 NOTE — ED Provider Notes (Signed)
[name]   MRN: [id number] DOB: 1945-07-27  Subjective:   Saliah Crisp is a 74 y.o. female presenting for medication refills, establishing care with a new PCP.  Patient reports that she has chronic neck pain, tingling in her hands.  Recently had a surgery in October for cervical decompression.  Has a history of NSTEMI, cerebral infarction.  Needs medication refill of her blood pressure medications.  Denies headache, confusion, vision change, weakness on one side of the body.  No current facility-administered medications for this encounter.  Current Outpatient Medications:  .  albuterol (VENTOLIN HFA) 108 (90 Base) MCG/ACT inhaler, Inhale 1-2 puffs into the lungs every 6 (six) hours as needed for wheezing or shortness of breath., Disp: 18 g, Rfl: 0 .  amLODipine (NORVASC) 10 MG tablet, Take 10 mg by mouth daily., Disp: , Rfl:  .  chlorthalidone (HYGROTON) 25 MG tablet, Take 1 tablet (25 mg total) by mouth daily., Disp: 30 tablet, Rfl: 0 .  cloNIDine (CATAPRES) 0.2 MG tablet, Take 1 tablet (0.2 mg total) by mouth 2 (two) times daily., Disp: 60 tablet, Rfl: 0 .  clopidogrel (PLAVIX) 75 MG tablet, Take 75 mg by mouth daily., Disp: , Rfl:  .  diclofenac Sodium (VOLTAREN) 1 % GEL, Apply 4 g topically 4 (four) times daily., Disp: 100 g, Rfl: 0 .  doxycycline (VIBRAMYCIN) 100 MG capsule, Take 1 capsule (100 mg total) by mouth 2 (two) times daily. (Patient not taking: Reported on 11/10/2019), Disp: 20 capsule, Rfl: 0 .  gabapentin (NEURONTIN) 100 MG capsule, Take 1 capsule (100 mg total) by mouth 3 (three) times daily., Disp: 90 capsule, Rfl: 1 .  glucose blood test strip, Use as instructed, Disp: 100 each, Rfl: 12 .  hydrALAZINE (APRESOLINE) 50 MG tablet, Take 50 mg by mouth 2 (two) times daily., Disp: , Rfl:  .  lidocaine (LIDODERM) 5 %, Place 1 patch onto the skin daily. Remove & Discard patch within 12 hours or as directed by MD (Patient not taking: Reported on 11/10/2019),  Disp: 30 patch, Rfl: 0 .  losartan (COZAAR) 50 MG tablet, Take 1 tablet (50 mg total) by mouth daily., Disp: 30 tablet, Rfl: 0 .  metFORMIN (GLUCOPHAGE) 500 MG tablet, Take 1.5 tablets (750 mg total) by mouth 2 (two) times daily with a meal., Disp: 90 tablet, Rfl: 0 .  methocarbamol (ROBAXIN) 750 MG tablet, Take 1 tablet (750 mg total) by mouth 3 (three) times daily as needed (muscle spasm/pain)., Disp: 15 tablet, Rfl: 0 .  MYRBETRIQ 25 MG TB24 tablet, Take 25 mg by mouth daily., Disp: , Rfl:  .  oxyCODONE-acetaminophen (PERCOCET/ROXICET) 5-325 MG tablet, Take 1 tablet by mouth every 4 (four) hours as needed., Disp: 10 tablet, Rfl: 0 .  potassium chloride (MICRO-K) 10 MEQ CR capsule, Take 10 mEq by mouth daily., Disp: , Rfl:  .  predniSONE (DELTASONE) 10 MG tablet, Take 3 tablets (30 mg total) by mouth daily with breakfast. (Patient not taking: Reported on 11/10/2019), Disp: 15 tablet, Rfl: 0 .  rosuvastatin (CRESTOR) 10 MG tablet, Take 10 mg by mouth at bedtime., Disp: , Rfl:    Allergies  Allergen Reactions  . Codeine Other (See Comments)    REACTION: Hallucinations  . Ibuprofen Other (See Comments)    REACTION: Elevated Blood Pressure  . Imdur [Isosorbide Nitrate] Other (See Comments)    Headache & upset stomach    Past Medical History:  Diagnosis Date  . Diabetes mellitus without complication (HCC)   .  GERD 09/04/2006   Qualifier: Diagnosis of  By: Duke Salvia    . History of echocardiogram    a. 2D ECHO: 11/06/2013 EF 65%; no WMA. Mild TR. PA pk pressure 43 mm HG  . Hypertension   . Stroke [facility])      Past Surgical History:  Procedure Laterality Date  . ABDOMINAL HYSTERECTOMY    . ANTERIOR CERVICAL DECOMPRESSION/DISCECTOMY FUSION 4 LEVELS N/A 11/11/2019   Procedure: Cervical three-four Cervical four-five Cervical five-six Cervical six-seven  Anterior cervical decompression/discectomy/fusion;  Surgeon: Maeola Harman, MD;  Location: [facility] OR;  Service: Neurosurgery;  Laterality:  N/A;  . LEFT HEART CATHETERIZATION WITH CORONARY ANGIOGRAM N/A 11/05/2013   Procedure: LEFT HEART CATHETERIZATION WITH CORONARY ANGIOGRAM;  Surgeon: Lennette Bihari, MD;  Location: [facility] CATH LAB;  Service: Cardiovascular;  Laterality: N/A;    History reviewed. No pertinent family history.  Social History   Tobacco Use  . Smoking status: Never Smoker  . Smokeless tobacco: Never Used  Substance Use Topics  . Alcohol use: No  . Drug use: No    ROS   Objective:   Vitals: BP (!) 176/74 (BP Location: Right Arm)   Pulse 77   Temp 98 F (36.7 C) (Oral)   Resp (!) 22   SpO2 99%   BP 153/67 on recheck.   Physical Exam Constitutional:      General: She is not in acute distress.    Appearance: Normal appearance. She is well-developed. She is not ill-appearing, toxic-appearing or diaphoretic.  HENT:     Head: Normocephalic and atraumatic.     Nose: Nose normal.     Mouth/Throat:     Mouth: Mucous membranes are moist.  Eyes:     Extraocular Movements: Extraocular movements intact.     Pupils: Pupils are equal, round, and reactive to light.  Neck:   Cardiovascular:     Rate and Rhythm: Normal rate and regular rhythm.     Pulses: Normal pulses.     Heart sounds: Normal heart sounds. No murmur heard.  No friction rub. No gallop.   Pulmonary:     Effort: Pulmonary effort is normal. No respiratory distress.     Breath sounds: Normal breath sounds. No stridor. No wheezing, rhonchi or rales.  Skin:    General: Skin is warm and dry.     Findings: No rash.  Neurological:     Mental Status: She is alert and oriented to person, place, and time.     Cranial Nerves: No cranial nerve deficit.     Motor: No weakness.     Coordination: Coordination normal.     Gait: Gait normal.     Deep Tendon Reflexes: Reflexes normal.     Comments: Negative Romberg and pronator drift.  Psychiatric:        Mood and Affect: Mood normal.        Behavior: Behavior normal.        Thought Content:  Thought content normal.        Judgment: Judgment normal.      Assessment and Plan :   PDMP not reviewed this encounter.  1. Neck pain   2. Essential hypertension   3. Chronic neck pain   4. Paresthesia   5. History of cerebral infarction   6. History of non-ST elevation myocardial infarction (NSTEMI)     Counseled patient that unfortunately really are unable to take over her care as a primary care practice.  Recommended she establish care with a  new primary care provider through [facility] internal medicine.  I refilled her blood pressure medications.  Emphasized need for follow-up with her neck surgeon.  At this time vital signs are stable for outpatient management, follow-up with her spine surgeon and establishing care with a new PCP. Counseled patient on potential for adverse effects with medications prescribed/recommended today, ER and return-to-clinic precautions discussed, patient verbalized understanding.    Wallis Bamberg, [place] 12/10/19 1641

## 2019-12-10 NOTE — ED Triage Notes (Addendum)
Patient has a scar on her neck.  Patient says she has had surgery in the past few weeks, but cannot tell me who the surgeon is or specifics.  Also says both hands numb and tingling at times and feet are numb and tingling.  Poor historian Repeatedly responds that "[facility] got everything you need" is the response when asked about her health history

## 2019-12-10 NOTE — Discharge Instructions (Addendum)
Please make sure that you follow-up with your neck surgeon for continued management of your neck pain and numbness and tingling.  They can also help you with medications for your chronic pain.   For diabetes or elevated blood sugar, please make sure you are avoiding starchy, carbohydrate foods like pasta, breads, pastry, rice, potatoes, desserts. These foods can elevated your blood sugar. Also, avoid sodas, sweet teas, sugary beverages, fruit juices.  Drinking plain water will be much more helpful, try 64 ounces of water daily.  It is okay to flavor your water naturally by cutting cucumber, lemon, mint or lime, placing it in a picture with water and drinking it over a period of 2 to 3 days as long as it remains refrigerated.    For elevated blood pressure, make sure you are monitoring salt in your diet.  Do not eat restaurant foods and limit processed foods at home, prepare/cook your own foods at home.  Processed foods include things like frozen meals preseasoned meats and dinners, deli meats, canned foods as they are high in sodium/salt.  Make sure your pain attention to sodium labels on foods you by at the grocery store.  For seasoning you can use a brand called Mrs. Dash which includes a lot of salt free seasonings.  Salads - kale, spinach, cabbage, spring mix; use seeds like pumpkin seeds or sunflower seeds, almonds, walnuts or pecans; you can also use 1-2 hard boiled eggs in your salads Fruits - avocadoes, berries (blueberries, raspberries, blackberries), apples, oranges, pomegranate, pear; avoid eating bananas, grapes regularly Vegetables - aspargus, cauliflower, broccoli, green beans, brussel spouts, bell peppers; stay away from starchy vegetables like potatoes, carrots, peas  Regarding meat it is better to eat lean meats and limit your red meat including pork to once a week.  Wild caught fish, chicken breast are good options as they tend to be leaner sources of good protein.   DO NOT EAT ANY  FOODS ON THIS LIST THAT YOU ARE ALLERGIC TO.

## 2020-01-05 ENCOUNTER — Ambulatory Visit (HOSPITAL_COMMUNITY)
Admission: EM | Admit: 2020-01-05 | Discharge: 2020-01-05 | Disposition: A | Discharge status: Home / Self Care | Payer: Medicare HMO | Attending: Emergency Medicine | Admitting: Emergency Medicine

## 2020-01-05 ENCOUNTER — Encounter (HOSPITAL_COMMUNITY): Payer: Self-pay | Admitting: Emergency Medicine

## 2020-01-05 ENCOUNTER — Other Ambulatory Visit: Payer: Self-pay

## 2020-01-05 DIAGNOSIS — E1140 Type 2 diabetes mellitus with diabetic neuropathy, unspecified: Secondary | ICD-10-CM | POA: Diagnosis not present

## 2020-01-05 DIAGNOSIS — I10 Essential (primary) hypertension: Secondary | ICD-10-CM | POA: Diagnosis not present

## 2020-01-05 DIAGNOSIS — Z760 Encounter for issue of repeat prescription: Secondary | ICD-10-CM

## 2020-01-05 DIAGNOSIS — I639 Cerebral infarction, unspecified: Secondary | ICD-10-CM | POA: Diagnosis not present

## 2020-01-05 MED ORDER — FREESTYLE SYSTEM KIT: 1.0000 | PACK | Freq: Every day | 0 refills | Status: DC

## 2020-01-05 MED ORDER — CLOPIDOGREL BISULFATE 75 MG PO TABS
75.0000 mg | ORAL_TABLET | Freq: Every day | ORAL | 0 refills | Status: DC
Start: 2020-01-05 — End: 2020-04-22

## 2020-01-05 MED ORDER — METHOCARBAMOL 500 MG PO TABS: 500.0000 mg | ORAL_TABLET | Freq: Four times a day (QID) | ORAL | 0 refills | Status: DC

## 2020-01-05 MED ORDER — GLUCOSE BLOOD VI STRP: ORAL_STRIP | 2 refills | Status: DC

## 2020-01-05 MED ORDER — CHLORTHALIDONE 25 MG PO TABS
25.0000 mg | ORAL_TABLET | Freq: Every day | ORAL | 0 refills | Status: DC
Start: 2020-01-05 — End: 2020-04-22

## 2020-01-05 MED ORDER — HYDRALAZINE HCL 50 MG PO TABS
50.0000 mg | ORAL_TABLET | Freq: Two times a day (BID) | ORAL | 0 refills | Status: DC
Start: 2020-01-05 — End: 2020-04-22

## 2020-01-05 NOTE — ED Provider Notes (Signed)
HPI  SUBJECTIVE:  [name] is a 74 y.o. female who presents for refill of multiple medications for diabetes, hypertension, and chronic neck pain.  She is not sure when she ran out of these medications.  She states that she is currently taking her leftover medications as prescribed.  She does not check her blood pressure nor her glucose at home because she is unable to get these machines to work.  She states that her primary care physician on [address] closed.  She has a past medical history of diabetes, hypertension, stroke, prolonged QT syndrome and is status post neck surgery.  PMD: None.  Past Medical History:  Diagnosis Date  . Diabetes mellitus without complication ([place])   . GERD 09/04/2006   Qualifier: Diagnosis of  By: Suzie Portela    . History of echocardiogram    a. 2D ECHO: 11/06/2013 EF 65%; no WMA. Mild TR. PA pk pressure 43 mm HG  . Hypertension   . Stroke [facility])     Past Surgical History:  Procedure Laterality Date  . ABDOMINAL HYSTERECTOMY    . ANTERIOR CERVICAL DECOMPRESSION/DISCECTOMY FUSION 4 LEVELS N/A 11/11/2019   Procedure: Cervical three-four Cervical four-five Cervical five-six Cervical six-seven  Anterior cervical decompression/discectomy/fusion;  Surgeon: Erline Levine, MD;  Location: [place];  Service: Neurosurgery;  Laterality: N/A;  . LEFT HEART CATHETERIZATION WITH CORONARY ANGIOGRAM N/A 11/05/2013   Procedure: LEFT HEART CATHETERIZATION WITH CORONARY ANGIOGRAM;  Surgeon: Troy Sine, MD;  Location: [facility] CATH LAB;  Service: Cardiovascular;  Laterality: N/A;    History reviewed. No pertinent family history.  Social History   Tobacco Use  . Smoking status: Never Smoker  . Smokeless tobacco: Never Used  Substance Use Topics  . Alcohol use: No  . Drug use: No    No current facility-administered medications for this encounter.  Current Outpatient Medications:  .  aspirin EC 81 MG tablet, Take 81 mg by mouth daily. Swallow whole., Disp: , Rfl:  .   cloNIDine (CATAPRES) 0.2 MG tablet, Take 1 tablet (0.2 mg total) by mouth 2 (two) times daily., Disp: 60 tablet, Rfl: 0 .  albuterol (VENTOLIN HFA) 108 (90 Base) MCG/ACT inhaler, Inhale 1-2 puffs into the lungs every 6 (six) hours as needed for wheezing or shortness of breath., Disp: 18 g, Rfl: 0 .  chlorthalidone (HYGROTON) 25 MG tablet, Take 1 tablet (25 mg total) by mouth daily., Disp: 30 tablet, Rfl: 0 .  clopidogrel (PLAVIX) 75 MG tablet, Take 1 tablet (75 mg total) by mouth daily., Disp: 30 tablet, Rfl: 0 .  gabapentin (NEURONTIN) 100 MG capsule, Take 1 capsule (100 mg total) by mouth 3 (three) times daily., Disp: 90 capsule, Rfl: 1 .  glucose blood test strip, Check your sugar in the morning before you eat breakfast, and one hour after a meal., Disp: 100 each, Rfl: 2 .  glucose monitoring kit (FREESTYLE) monitoring kit, 1 each by Does not apply route daily. Check glucose once in the morning before breakfast and 1 hour after a meal, Disp: 1 each, Rfl: 0 .  hydrALAZINE (APRESOLINE) 50 MG tablet, Take 1 tablet (50 mg total) by mouth 2 (two) times daily., Disp: 30 tablet, Rfl: 0 .  lidocaine (LIDODERM) 5 %, Place 1 patch onto the skin daily. Remove & Discard patch within 12 hours or as directed by MD (Patient not taking: Reported on 11/10/2019), Disp: 30 patch, Rfl: 0 .  metFORMIN (GLUCOPHAGE) 500 MG tablet, Take 1.5 tablets (750 mg total) by mouth  2 (two) times daily with a meal., Disp: 90 tablet, Rfl: 0 .  methocarbamol (ROBAXIN) 500 MG tablet, Take 1 tablet (500 mg total) by mouth 4 (four) times daily., Disp: 40 tablet, Rfl: 0 .  oxyCODONE-acetaminophen (PERCOCET/ROXICET) 5-325 MG tablet, Take 1 tablet by mouth every 4 (four) hours as needed., Disp: 10 tablet, Rfl: 0 .  potassium chloride (MICRO-K) 10 MEQ CR capsule, Take 10 mEq by mouth daily., Disp: , Rfl:  .  rosuvastatin (CRESTOR) 10 MG tablet, Take 10 mg by mouth at bedtime., Disp: , Rfl:   Allergies  Allergen Reactions  . Codeine Other  (See Comments)    REACTION: Hallucinations  . Ibuprofen Other (See Comments)    REACTION: Elevated Blood Pressure  . Imdur [Isosorbide Nitrate] Other (See Comments)    Headache & upset stomach     ROS  As noted in HPI.   Physical Exam  BP 121/75 (BP Location: Right Arm)   Pulse 84   Temp 98.3 F (36.8 C) (Oral)   Resp (!) 22   SpO2 99%   Constitutional: Well developed, well nourished, no acute distress Eyes:  EOMI, conjunctiva normal bilaterally HENT: Normocephalic, atraumatic,mucus membranes moist Respiratory: Normal inspiratory effort Cardiovascular: Normal rate GI: nondistended skin: No rash, skin intact Musculoskeletal: no deformities Neurologic: Alert & oriented x 3, no focal neuro deficits Psychiatric: Speech and behavior appropriate   ED Course   Medications - No data to display  No orders of the defined types were placed in this encounter.   No results found for this or any previous visit (from the past 24 hour(s)). No results found.  ED Clinical Impression  1. Type 2 diabetes mellitus with diabetic neuropathy, without long-term current use of insulin ([place])   2. Essential hypertension   3. Encounter for medication refill   4. Cerebrovascular accident (CVA), unspecified mechanism [facility])      ED Assessment/Plan  Went through entire bag of medications that the patient brought in, consolidated some bottles as to help eliminate confusion.  Went over each medication with her and explained to her how many times a day to take it.  Patient states that she did not know that she was supposed to be taking some of these multiple times a day.  Discussed with her that while we are happy to see her, we cannot manage her long-term chronic conditions, however, I will order a primary care assistance as she has transportation and financial issues that limit her ability to access care.  Discussed with her that we have multiple primary care offices here on this campus.  We  will refill the hydralazine, methocarbamol and chlorthalidone as this is what she states that she needs refills on.  will refill the Plavix as it seems that she has not filled this since March.  Discussed with her that I am unable to fill narcotics.  We will discontinue the Myrbetriq as she states that she does not have any bladder pain or spasms. will also write for a glucometer and strips so that she can keep a log of her blood sugar.    Patient on multiple antihypertensive medications.  She brings in the chlorthalidone, hydralazine for refills.  She states that she is taking the clonidine.  I will have her continue this.  Had a long discussion with her to not start the chlorthalidone or hydralazine until we get more information about what her blood pressure is normally on the clonidine.  States that she cannot get her blood  pressure machine to work.  Encouraged her to take it to the pharmacy to have them help her figure it out.  Her blood pressure is normal today.  We will have her measure her blood pressure once a day, keep an eye on it. She will need to be seen by primary care provider in order to get restarted on further antihypertensive medications.   I do not want to drop her blood pressure too quickly.  Also discussed this with patient.  Gave her primary care list for ongoing care and encouraged her to follow-up with a [facility] internal medicine or family practice clinic ASAP.  Discussed MDM, treatment plan, and plan for follow-up with patient. Discussed sn/sx that should prompt return to the ED. patient agrees with plan.   Meds ordered this encounter  Medications  . chlorthalidone (HYGROTON) 25 MG tablet    Sig: Take 1 tablet (25 mg total) by mouth daily.    Dispense:  30 tablet    Refill:  0  . clopidogrel (PLAVIX) 75 MG tablet    Sig: Take 1 tablet (75 mg total) by mouth daily.    Dispense:  30 tablet    Refill:  0  . hydrALAZINE (APRESOLINE) 50 MG tablet    Sig: Take 1 tablet (50 mg total)  by mouth 2 (two) times daily.    Dispense:  30 tablet    Refill:  0  . glucose monitoring kit (FREESTYLE) monitoring kit    Sig: 1 each by Does not apply route daily. Check glucose once in the morning before breakfast and 1 hour after a meal    Dispense:  1 each    Refill:  0  . glucose blood test strip    Sig: Check your sugar in the morning before you eat breakfast, and one hour after a meal.    Dispense:  100 each    Refill:  2  . methocarbamol (ROBAXIN) 500 MG tablet    Sig: Take 1 tablet (500 mg total) by mouth 4 (four) times daily.    Dispense:  40 tablet    Refill:  0    *This clinic note was created using [employer]. Therefore, there may be occasional mistakes despite careful proofreading.   ?   Melynda Ripple, MD 01/06/20 [phone]

## 2020-01-05 NOTE — Discharge Instructions (Signed)
Your blood pressure was normal today.  I would not take any of your blood pressure medications, the chlorthalidone, hydralazine, for now as taking them can drop your blood pressure too low and can cause problems.  If you are taking the clonidine, you can continue taking that.  In the meantime, measure your blood pressure once a day, preferably at the same time every day. Keep a log of this and bring it to your next doctor's appointment.  Bring your blood pressure cuff as well.     Keep a log of your blood sugar as well.  Measure it twice a day, once before breakfast and once after dinner.  This will help your primary care provider determine if they need to change your Metformin.  Get a pill splitter so that you can take your Metformin appropriately.  You need to take 1-1/2 pills twice a day.  Return immediately to the ER if you start having chest pain, headache, problems seeing, problems talking, problems walking, if you feel like you're about to pass out, if you do pass out, if you have a seizure, or for any other concerns.  Below is a list of primary care practices who are taking new patients for you to follow-up with.  I have also ordered someone to help you find a new primary care provider that you can get to.  You need to be seen by a primary care provider as soon as you possibly can.  [facility] internal medicine clinic Ground Floor - [facility], [address] [place], [place], [place] [address] [phone]  [facility] at [facility] [address] [place], [place] [address] [phone]  [facility] [address] [place], [place] [address] [phone]  [name] [phone] [address] [place] [address]  [name]: [address] [place] [address]  [phone]  [facility]: [address] [place] [address]   [phone]  [facility] Family Medicine: [address] [place] [address]  [phone]  [facility] primary [facility] : [address] [place] [address] [phone]  [facility] Primary Care: [address] [place] [address] [phone]  [facility]: [address] [place] [address] [phone]  Dr. Oneal Grout [address] [facility] [place] [facility] [place] [address]  [phone]  Dr. Jackie Plum, [facility]. [address] [place], [place] [address]  [phone]  Go to www.goodrx.com to look up your medications. This will give you a list of where you can find your prescriptions at the most affordable prices. Or ask the pharmacist what the cash price is, or if they have any other discount programs available to help make your medication more affordable. This can be less expensive than what you would pay with insurance.

## 2020-01-05 NOTE — ED Triage Notes (Signed)
Patient states he provider on [address] has closed.  Patient is needing refill of medications.  Patient has vague body pain.  Patient is a poor historian.

## 2020-04-12 ENCOUNTER — Other Ambulatory Visit: Payer: Self-pay

## 2020-04-12 ENCOUNTER — Emergency Department (HOSPITAL_COMMUNITY)
Admission: EM | Admit: 2020-04-12 | Discharge: 2020-04-12 | Disposition: A | Discharge status: Home / Self Care | Payer: Medicare Other | Attending: Emergency Medicine | Admitting: Emergency Medicine

## 2020-04-12 ENCOUNTER — Emergency Department (HOSPITAL_COMMUNITY): Payer: Medicare Other

## 2020-04-12 ENCOUNTER — Encounter (HOSPITAL_COMMUNITY): Payer: Self-pay | Admitting: Emergency Medicine

## 2020-04-12 DIAGNOSIS — I10 Essential (primary) hypertension: Secondary | ICD-10-CM | POA: Diagnosis not present

## 2020-04-12 DIAGNOSIS — W19XXXA Unspecified fall, initial encounter: Secondary | ICD-10-CM | POA: Diagnosis not present

## 2020-04-12 DIAGNOSIS — E1165 Type 2 diabetes mellitus with hyperglycemia: Secondary | ICD-10-CM | POA: Insufficient documentation

## 2020-04-12 DIAGNOSIS — M2550 Pain in unspecified joint: Secondary | ICD-10-CM | POA: Insufficient documentation

## 2020-04-12 DIAGNOSIS — R52 Pain, unspecified: Secondary | ICD-10-CM | POA: Diagnosis not present

## 2020-04-12 DIAGNOSIS — Z7984 Long term (current) use of oral hypoglycemic drugs: Secondary | ICD-10-CM | POA: Insufficient documentation

## 2020-04-12 DIAGNOSIS — Z8616 Personal history of COVID-19: Secondary | ICD-10-CM | POA: Diagnosis not present

## 2020-04-12 DIAGNOSIS — R739 Hyperglycemia, unspecified: Secondary | ICD-10-CM

## 2020-04-12 DIAGNOSIS — Z79899 Other long term (current) drug therapy: Secondary | ICD-10-CM | POA: Diagnosis not present

## 2020-04-12 DIAGNOSIS — Z955 Presence of coronary angioplasty implant and graft: Secondary | ICD-10-CM | POA: Diagnosis not present

## 2020-04-12 DIAGNOSIS — E162 Hypoglycemia, unspecified: Secondary | ICD-10-CM | POA: Diagnosis not present

## 2020-04-12 DIAGNOSIS — R6889 Other general symptoms and signs: Secondary | ICD-10-CM | POA: Diagnosis not present

## 2020-04-12 DIAGNOSIS — Z7982 Long term (current) use of aspirin: Secondary | ICD-10-CM | POA: Diagnosis not present

## 2020-04-12 LAB — CBC
HCT: 36.9 % (ref 36.0–46.0)
Hemoglobin: 12.1 g/dL (ref 12.0–15.0)
MCH: 29.9 pg (ref 26.0–34.0)
MCHC: 32.8 g/dL (ref 30.0–36.0)
MCV: 91.1 fL (ref 80.0–100.0)
Platelets: 260 10*3/uL (ref 150–400)
RBC: 4.05 MIL/uL (ref 3.87–5.11)
RDW: 12.1 % (ref 11.5–15.5)
WBC: 6 10*3/uL (ref 4.0–10.5)
nRBC: 0 % (ref 0.0–0.2)

## 2020-04-12 LAB — BASIC METABOLIC PANEL
Anion gap: 9 (ref 5–15)
BUN: 13 mg/dL (ref 8–23)
CO2: 34 mmol/L — ABNORMAL HIGH (ref 22–32)
Calcium: 8.9 mg/dL (ref 8.9–10.3)
Chloride: 90 mmol/L — ABNORMAL LOW (ref 98–111)
Creatinine, Ser: 1.19 mg/dL — ABNORMAL HIGH (ref 0.44–1.00)
GFR, Estimated: 48 mL/min — ABNORMAL LOW (ref >60)
Glucose, Bld: 468 mg/dL — ABNORMAL HIGH (ref 70–99)
Potassium: 3.7 mmol/L (ref 3.5–5.1)
Sodium: 133 mmol/L — ABNORMAL LOW (ref 135–145)

## 2020-04-12 LAB — CBG MONITORING, ED
Glucose-Capillary: 470 mg/dL — ABNORMAL HIGH (ref 70–99)
Glucose-Capillary: 352 mg/dL — ABNORMAL HIGH (ref 70–99)

## 2020-04-12 MED ORDER — SODIUM CHLORIDE 0.9 % IV SOLN: INTRAVENOUS | Status: DC

## 2020-04-12 MED ORDER — OXYCODONE-ACETAMINOPHEN 5-325 MG PO TABS
1.0000 | ORAL_TABLET | Freq: Four times a day (QID) | ORAL | 0 refills | Status: DC | PRN
Start: 2020-04-12 — End: 2020-04-18

## 2020-04-12 MED ORDER — SODIUM CHLORIDE 0.9 % IV BOLUS
500.0000 mL | Freq: Once | INTRAVENOUS | Status: AC
  Administered 2020-04-12: 500 mL via INTRAVENOUS

## 2020-04-12 MED ORDER — ONDANSETRON HCL 4 MG/2ML IJ SOLN
4.0000 mg | Freq: Once | INTRAMUSCULAR | Status: AC
  Administered 2020-04-12: 4 mg via INTRAVENOUS
  Filled 2020-04-12: qty 2

## 2020-04-12 MED ORDER — INSULIN ASPART 100 UNIT/ML ~~LOC~~ SOLN: 10.0000 [IU] | Freq: Once | SUBCUTANEOUS | Status: DC

## 2020-04-12 MED ORDER — HYDROMORPHONE HCL 1 MG/ML IJ SOLN
1.0000 mg | Freq: Once | INTRAMUSCULAR | Status: AC
  Administered 2020-04-12: 1 mg via INTRAVENOUS
  Filled 2020-04-12: qty 1

## 2020-04-12 NOTE — Discharge Instructions (Addendum)
Follow-up with your primary care doctor or the wellness clinic if you no longer have a primary care doctor to have your blood sugars rechecked.  Take the Percocet as needed for pain.  But you will need to follow-up with your doctor probably to get refills on that.  Because we have to give a limited amount from the emergency department.

## 2020-04-12 NOTE — ED Provider Notes (Signed)
[place] EMERGENCY DEPARTMENT Provider Note   CSN: [id number] Arrival date & time: 04/12/20  1115     History Chief Complaint  Patient presents with  . Hyperglycemia  . Joint Pain  . Shoulder Pain    [name] is a 75 y.o. female.  Patient brought in by EMS.  Patient with a complaint of joint pain all over.  Patient's had trouble with this before.  In the past she in the fall was on Percocet for this.  And more recently had a course of tramadol which would have expired on March 14.  Patient also has a history of diabetes.  She is on Glucophage.  States that she has been taking her medicines.  His blood sugar on arrival here was 470 on the formal BNP was 468 but no signs of any acidosis.  Patient's main complaint is this body aches and joint pain.  No fever.        Past Medical History:  Diagnosis Date  . Diabetes mellitus without complication ([place])   . GERD 09/04/2006   Qualifier: Diagnosis of  By: Suzie Portela    . History of echocardiogram    a. 2D ECHO: 11/06/2013 EF 65%; no WMA. Mild TR. PA pk pressure 43 mm HG  . Hypertension   . Stroke [facility])     Patient Active Problem List   Diagnosis Date Noted  . Cervical stenosis of spine 11/10/2019  . Acute respiratory disease due to COVID-19 virus 05/23/2018  . NSTEMI (non-ST elevated myocardial infarction) ([place]) 11/05/2013  . Spinal stenosis of cervical region 10/20/2012  . Prolonged QT interval 10/18/2012  . Left sided numbness 10/18/2012  . Palpitations 10/18/2012  . 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 Numbness 06/20/2012  . Diabetes mellitus ([place]) 06/20/2012  . CVA (cerebral infarction) 06/20/2012  . Hypokalemia 06/20/2012  . ARTHRALGIA 01/28/2009  . CHEST PAIN UNSPECIFIED 11/26/2008  . DEPRESSION 09/04/2006  . Essential hypertension 09/04/2006  . GERD 09/04/2006  . MULTIPLE SITES, ARTHRITIS-ACUTE/CHRONIC 09/04/2006  . LOW BACK PAIN, CHRONIC 09/04/2006    Past Surgical  History:  Procedure Laterality Date  . ABDOMINAL HYSTERECTOMY    . ANTERIOR CERVICAL DECOMPRESSION/DISCECTOMY FUSION 4 LEVELS N/A 11/11/2019   Procedure: Cervical three-four Cervical four-five Cervical five-six Cervical six-seven  Anterior cervical decompression/discectomy/fusion;  Surgeon: Erline Levine, MD;  Location: [place];  Service: Neurosurgery;  Laterality: N/A;  . LEFT HEART CATHETERIZATION WITH CORONARY ANGIOGRAM N/A 11/05/2013   Procedure: LEFT HEART CATHETERIZATION WITH CORONARY ANGIOGRAM;  Surgeon: Troy Sine, MD;  [facility] C[facility] LAB;  Service: Cardiovascular;  Laterality: N/A;     OB History   No obstetric history on file.     No family history on file.  Social History   Tobacco Use  . Smoking status: Never Smoker  . Smokeless tobacco: Never Used  Substance Use Topics  . Alcohol use: No  . Drug use: No    Home Medications Prior to Admission medications   Medication Sig Start Date End Date Taking? Authorizing Provider  albuterol (VENTOLIN HFA) 108 (90 Base) MCG/ACT inhaler Inhale 1-2 puffs into the lungs every 6 (six) hours as needed for wheezing or shortness of breath. 10/20/19   Jaynee Eagles, PA-C  aspirin EC 81 MG tablet Take 81 mg by mouth daily. Swallow whole.    [provider]  chlorthalidone (HYGROTON) 25 MG tablet Take 1 tablet (25 mg total) by mouth daily. 01/05/20   Melynda Ripple, MD  cloNIDine (CATAPRES) 0.2  MG tablet Take 1 tablet (0.2 mg total) by mouth 2 (two) times daily. 12/10/19   Jaynee Eagles, PA-C  clopidogrel (PLAVIX) 75 MG tablet Take 1 tablet (75 mg total) by mouth daily. 01/05/20   Melynda Ripple, MD  gabapentin (NEURONTIN) 100 MG capsule Take 1 capsule (100 mg total) by mouth 3 (three) times daily. 11/01/19 12/31/19  Maudie Flakes, MD  glucose blood test strip Check your sugar in the morning before you eat breakfast, and one hour after a meal. 01/05/20   Melynda Ripple, MD  glucose monitoring kit (FREESTYLE) monitoring  kit 1 each by Does not apply route daily. Check glucose once in the morning before breakfast and 1 hour after a meal 01/05/20   Melynda Ripple, MD  hydrALAZINE (APRESOLINE) 50 MG tablet Take 1 tablet (50 mg total) by mouth 2 (two) times daily. 01/05/20   Melynda Ripple, MD  lidocaine (LIDODERM) 5 % Place 1 patch onto the skin daily. Remove & Discard patch within 12 hours or as directed by MD Patient not taking: Reported on 11/10/2019 09/30/19   Palumbo, April, MD  metFORMIN (GLUCOPHAGE) 500 MG tablet Take 1.5 tablets (750 mg total) by mouth 2 (two) times daily with a meal. 10/27/19 11/26/19  Veryl Speak, MD  methocarbamol (ROBAXIN) 500 MG tablet Take 1 tablet (500 mg total) by mouth 4 (four) times daily. 01/05/20   Melynda Ripple, MD  oxyCODONE-acetaminophen (PERCOCET/ROXICET) 5-325 MG tablet Take 1 tablet by mouth every 4 (four) hours as needed. 11/27/19   Tegeler, Gwenyth Allegra, MD  potassium chloride (MICRO-K) 10 MEQ CR capsule Take 10 mEq by mouth daily. 03/16/19   [provider]  rosuvastatin (CRESTOR) 10 MG tablet Take 10 mg by mouth at bedtime. 03/22/19   [provider]  MYRBETRIQ 25 MG TB24 tablet Take 25 mg by mouth daily. 02/03/18 01/05/20  [provider]    Allergies    Codeine, Ibuprofen, and Imdur [isosorbide nitrate]  Review of Systems   Review of Systems  Constitutional: Negative for chills and fever.  HENT: Negative for rhinorrhea and sore throat.   Eyes: Negative for visual disturbance.  Respiratory: Negative for cough and shortness of breath.   Cardiovascular: Negative for chest pain and leg swelling.  Gastrointestinal: Negative for abdominal pain, diarrhea, nausea and vomiting.  Genitourinary: Negative for dysuria.  Musculoskeletal: Positive for arthralgias and myalgias. Negative for back pain and neck pain.  Skin: Negative for rash.  Neurological: Negative for dizziness, light-headedness and headaches.  Hematological: Does not  bruise/bleed easily.  Psychiatric/Behavioral: Negative for confusion.    Physical Exam Updated Vital Signs BP (!) 158/71   Pulse 72   Temp 98.5 F (36.9 C) (Oral)   Resp 18   Ht 1.626 m (_0 )   Wt 68 kg   SpO2 98%   BMI 25.75 kg/m   Physical Exam Vitals and nursing note reviewed.  Constitutional:      General: She is not in acute distress.    Appearance: Normal appearance. She is well-developed.  HENT:     Head: Normocephalic and atraumatic.  Eyes:     Extraocular Movements: Extraocular movements intact.     Conjunctiva/sclera: Conjunctivae normal.     Pupils: Pupils are equal, round, and reactive to light.  Cardiovascular:     Rate and Rhythm: Normal rate and regular rhythm.     Heart sounds: No murmur heard.   Pulmonary:     Effort: Pulmonary effort is normal. No respiratory distress.  Breath sounds: Normal breath sounds.  Abdominal:     Palpations: Abdomen is soft.     Tenderness: There is no abdominal tenderness.  Musculoskeletal:        General: No swelling, tenderness, deformity or signs of injury.     Cervical back: Neck supple.  Skin:    General: Skin is warm and dry.     Capillary Refill: Capillary refill takes less than 2 seconds.  Neurological:     General: No focal deficit present.     Mental Status: She is alert and oriented to person, place, and time.     Cranial Nerves: No cranial nerve deficit.     Sensory: No sensory deficit.     Motor: No weakness.     ED Results / Procedures / Treatments   Labs (all labs ordered are listed, but only abnormal results are displayed) Labs Reviewed  CBG MONITORING, ED - Abnormal; Notable for the following components:      Result Value   Glucose-Capillary 470 (*)    All other components within normal limits  CBC  BASIC METABOLIC PANEL  URINALYSIS, ROUTINE W REFLEX MICROSCOPIC    EKG EKG Interpretation  Date/Time:  Tuesday April 12 2020 11:37:04 EDT Ventricular Rate:  78 PR Interval:    QRS  Duration: 78 QT Interval:  383 QTC Calculation: 437 R Axis:   46 Text Interpretation: Sinus rhythm Borderline T wave abnormalities Confirmed by Fredia Sorrow [phone]) on 04/12/2020 11:42:17 AM   Radiology No results found.  Procedures Procedures   Medications Ordered in ED Medications - No data to display  ED Course  I have reviewed the triage vital signs and the nursing notes.  Pertinent labs & imaging results that were available during my care of the patient were reviewed by me and considered in my medical decision making (see chart for details).    MDM Rules/Calculators/A&P                          H joint pain improved here with hydromorphone.  That all resolved.  Patient's blood sugar just with IV fluids came down to 352.  I did order 10 units of insulin regular insulin thought she had received that to bring it down from 470-352 but nurse informed me that she had not received it yet.  So we will hold off on giving the insulin because she shown improvement.  We will discharge her home she has her Metformin at home we will have her follow-up with her wellness clinic or her primary care doctor which she seems to be a little can confused about who that may be.  Patient also given a short course of Percocet to help with the pain.  Edition patient showed no evidence of acidosis.  CO2 was 34 and potassium was 3.7  Urinalysis was initially ordered.  Patient states she wants to go home.  She denies any urinary tract symptoms.  Told her that urine Rosanne Gutting tract infection not completely ruled out but she wants to go home Final Clinical Impression(s) / ED Diagnoses Final diagnoses:  None    Rx / DC Orders ED Discharge Orders    None       Fredia Sorrow, MD 04/12/20 1557

## 2020-04-12 NOTE — ED Notes (Signed)
Pt verbalizes understanding of discharge instructions. Opportunity for questions and answers were provided. Pt discharged from the ED.   ?

## 2020-04-12 NOTE — ED Triage Notes (Signed)
Arrived via EMS from home. Patient concerned of having joint pain all over body especially left shoulder and bilateral lower extremities. States pain still not better from a fall [facility] a while ago. Seen in the ED after fall. EMS reported patient's CBG 410 and BP 180/90.

## 2020-04-18 ENCOUNTER — Emergency Department (HOSPITAL_COMMUNITY)
Admission: EM | Admit: 2020-04-18 | Discharge: 2020-04-18 | Disposition: A | Discharge status: Home / Self Care | Payer: Medicare Other | Attending: Emergency Medicine | Admitting: Emergency Medicine

## 2020-04-18 DIAGNOSIS — Z7902 Long term (current) use of antithrombotics/antiplatelets: Secondary | ICD-10-CM | POA: Insufficient documentation

## 2020-04-18 DIAGNOSIS — Z79899 Other long term (current) drug therapy: Secondary | ICD-10-CM | POA: Insufficient documentation

## 2020-04-18 DIAGNOSIS — R6889 Other general symptoms and signs: Secondary | ICD-10-CM | POA: Diagnosis not present

## 2020-04-18 DIAGNOSIS — Z7982 Long term (current) use of aspirin: Secondary | ICD-10-CM | POA: Diagnosis not present

## 2020-04-18 DIAGNOSIS — Z743 Need for continuous supervision: Secondary | ICD-10-CM | POA: Diagnosis not present

## 2020-04-18 DIAGNOSIS — M79605 Pain in left leg: Secondary | ICD-10-CM | POA: Diagnosis not present

## 2020-04-18 DIAGNOSIS — M791 Myalgia, unspecified site: Secondary | ICD-10-CM | POA: Insufficient documentation

## 2020-04-18 DIAGNOSIS — N189 Chronic kidney disease, unspecified: Secondary | ICD-10-CM | POA: Insufficient documentation

## 2020-04-18 DIAGNOSIS — W19XXXA Unspecified fall, initial encounter: Secondary | ICD-10-CM | POA: Diagnosis not present

## 2020-04-18 DIAGNOSIS — I129 Hypertensive chronic kidney disease with stage 1 through stage 4 chronic kidney disease, or unspecified chronic kidney disease: Secondary | ICD-10-CM | POA: Diagnosis not present

## 2020-04-18 DIAGNOSIS — R52 Pain, unspecified: Secondary | ICD-10-CM

## 2020-04-18 DIAGNOSIS — E1165 Type 2 diabetes mellitus with hyperglycemia: Secondary | ICD-10-CM | POA: Insufficient documentation

## 2020-04-18 DIAGNOSIS — E1140 Type 2 diabetes mellitus with diabetic neuropathy, unspecified: Secondary | ICD-10-CM | POA: Diagnosis not present

## 2020-04-18 DIAGNOSIS — Z7984 Long term (current) use of oral hypoglycemic drugs: Secondary | ICD-10-CM | POA: Insufficient documentation

## 2020-04-18 DIAGNOSIS — E1142 Type 2 diabetes mellitus with diabetic polyneuropathy: Secondary | ICD-10-CM

## 2020-04-18 DIAGNOSIS — M79604 Pain in right leg: Secondary | ICD-10-CM | POA: Insufficient documentation

## 2020-04-18 DIAGNOSIS — R739 Hyperglycemia, unspecified: Secondary | ICD-10-CM

## 2020-04-18 LAB — CBC WITH DIFFERENTIAL/PLATELET
Abs Immature Granulocytes: 0.02 10*3/uL (ref 0.00–0.07)
Basophils Absolute: 0.1 10*3/uL (ref 0.0–0.1)
Basophils Relative: 1 %
Eosinophils Absolute: 0.1 10*3/uL (ref 0.0–0.5)
Eosinophils Relative: 1 %
HCT: 36.9 % (ref 36.0–46.0)
Hemoglobin: 12.2 g/dL (ref 12.0–15.0)
Immature Granulocytes: 0 %
Lymphocytes Relative: 16 %
Lymphs Abs: 1.3 10*3/uL (ref 0.7–4.0)
MCH: 30 pg (ref 26.0–34.0)
MCHC: 33.1 g/dL (ref 30.0–36.0)
MCV: 90.9 fL (ref 80.0–100.0)
Monocytes Absolute: 0.5 10*3/uL (ref 0.1–1.0)
Monocytes Relative: 6 %
Neutro Abs: 6.5 10*3/uL (ref 1.7–7.7)
Neutrophils Relative %: 76 %
Platelets: 252 10*3/uL (ref 150–400)
RBC: 4.06 MIL/uL (ref 3.87–5.11)
RDW: 12.2 % (ref 11.5–15.5)
WBC: 8.5 10*3/uL (ref 4.0–10.5)
nRBC: 0 % (ref 0.0–0.2)

## 2020-04-18 LAB — BASIC METABOLIC PANEL
Anion gap: 10 (ref 5–15)
BUN: 13 mg/dL (ref 8–23)
CO2: 29 mmol/L (ref 22–32)
Calcium: 8.7 mg/dL — ABNORMAL LOW (ref 8.9–10.3)
Chloride: 95 mmol/L — ABNORMAL LOW (ref 98–111)
Creatinine, Ser: 1.17 mg/dL — ABNORMAL HIGH (ref 0.44–1.00)
GFR, Estimated: 49 mL/min — ABNORMAL LOW (ref >60)
Glucose, Bld: 372 mg/dL — ABNORMAL HIGH (ref 70–99)
Potassium: 3.6 mmol/L (ref 3.5–5.1)
Sodium: 134 mmol/L — ABNORMAL LOW (ref 135–145)

## 2020-04-18 LAB — CBG MONITORING, ED: Glucose-Capillary: 360 mg/dL — ABNORMAL HIGH (ref 70–99)

## 2020-04-18 MED ORDER — GABAPENTIN 100 MG PO CAPS
100.0000 mg | ORAL_CAPSULE | Freq: Three times a day (TID) | ORAL | 1 refills | Status: DC
Start: 2020-04-18 — End: 2020-07-28

## 2020-04-18 NOTE — ED Triage Notes (Addendum)
Pt BIBA for bilat hip pain greater on the left along with generalized pain. Pt seen 6 days ago patient fell. Pt requesting more oxycodone for pain. Pt is ambulatory with no assistance. Pt hyperglycemic with EMS CBG 418.

## 2020-04-18 NOTE — ED Provider Notes (Signed)
[facility] EMERGENCY DEPARTMENT Provider Note   CSN: [id number] Arrival date & time: 04/18/20  1407     History Chief Complaint  Patient presents with  . Leg Pain    [name] is a 75 y.o. female resents emergency department with severe pain on the entire left side and both legs. She is a terrible historian with very poor insight, history is supplemented by emr and granddaughter who stays with her on the weekends.  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  She underwent an anterior cervical decompression and fusion of vertebrae C3/4-C6/7.  Her granddaughter states that she complains about her legs hurting all the time that she has not noticed that she has had any decrease in her mobility and has not noticed that she has any weakness in her lower extremities.  She is ambulatory and arrives via EMS after she took the bus to her doctor's office and found out that they were closed.  Patient was seen here 6 days ago and was given Percocet for her chronic pain issues.  She states that she is out of her medicine and just took her last pill today.  She was unaware that the current medication she is taking is a narcotic and states that she does not want to be on any of that "addictive medicine."  Patient is not very forthcoming in describing the pain in her feet but eventually does admit that the pain is burning and sometimes numb.  She always has high blood sugars here in the emergency department.  Today her blood sugar is in the mid 300s.  She states that she has not made appointments with her doctor because "it will stay open like they used to."  And she says that there is no reason to go over there if you make an appointment if she "just might not be there."  I have explained to the patient that if she makes an  appointment that the doctor will be there because that is how appointments work.  She denies any fevers, chills, urinary symptoms, loss of bowel or bladder function, incontinence. HPI     Past Medical History:  Diagnosis Date  . Diabetes mellitus without complication ([place])   . GERD 09/04/2006   Qualifier: Diagnosis of  By: Suzie Portela    . History of echocardiogram    a. 2D ECHO: 11/06/2013 EF 65%; no WMA. Mild TR. PA pk pressure 43 mm HG  . Hypertension   . Stroke [facility])     Patient Active Problem List   Diagnosis Date Noted  . Cervical stenosis of spine 11/10/2019  . Acute respiratory disease due to COVID-19 virus 05/23/2018  . NSTEMI (non-ST elevated myocardial infarction) ([place]) 11/05/2013  . Spinal stenosis of cervical region 10/20/2012  . Prolonged QT interval 10/18/2012  . Left sided numbness 10/18/2012  . Palpitations 10/18/2012  . 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 Numbness 06/20/2012  . Diabetes mellitus ([place]) 06/20/2012  . CVA (cerebral infarction) 06/20/2012  . Hypokalemia 06/20/2012  . ARTHRALGIA 01/28/2009  . CHEST PAIN UNSPECIFIED 11/26/2008  . DEPRESSION 09/04/2006  . Essential hypertension 09/04/2006  . GERD 09/04/2006  . MULTIPLE SITES, ARTHRITIS-ACUTE/CHRONIC 09/04/2006  . LOW BACK PAIN, CHRONIC 09/04/2006    Past Surgical History:  Procedure Laterality Date  . ABDOMINAL  HYSTERECTOMY    . ANTERIOR CERVICAL DECOMPRESSION/DISCECTOMY FUSION 4 LEVELS N/A 11/11/2019   Procedure: Cervical three-four Cervical four-five Cervical five-six Cervical six-seven  Anterior cervical decompression/discectomy/fusion;  Surgeon: Maeola Harman, MD;  Location: [facility] OR;  Service: Neurosurgery;  Laterality: N/A;  . LEFT HEART CATHETERIZATION WITH CORONARY ANGIOGRAM N/A 11/05/2013   Procedure: LEFT HEART CATHETERIZATION WITH CORONARY ANGIOGRAM;  Surgeon: Lennette Bihari, MD;  Location[facility] LAB;  Service: Cardiovascular;  Laterality: N/A;     OB History   No  obstetric history on file.     No family history on file.  Social History   Tobacco Use  . Smoking status: Never Smoker  . Smokeless tobacco: Never Used  Substance Use Topics  . Alcohol use: No  . Drug use: No    Home Medications Prior to Admission medications   Medication Sig Start Date End Date Taking? Authorizing Provider  albuterol (VENTOLIN HFA) 108 (90 Base) MCG/ACT inhaler Inhale 1-2 puffs into the lungs every 6 (six) hours as needed for wheezing or shortness of breath. 10/20/19   Wallis Bamberg, PA-C  aspirin EC 81 MG tablet Take 81 mg by mouth daily. Swallow whole.    [provider]  chlorthalidone (HYGROTON) 25 MG tablet Take 1 tablet (25 mg total) by mouth daily. 01/05/20   Domenick Gong, MD  cloNIDine (CATAPRES) 0.2 MG tablet Take 1 tablet (0.2 mg total) by mouth 2 (two) times daily. 12/10/19   Wallis Bamberg, PA-C  clopidogrel (PLAVIX) 75 MG tablet Take 1 tablet (75 mg total) by mouth daily. 01/05/20   Domenick Gong, MD  gabapentin (NEURONTIN) 100 MG capsule Take 1 capsule (100 mg total) by mouth 3 (three) times daily. 11/01/19 12/31/19  Sabas Sous, MD  glucose blood test strip Check your sugar in the morning before you eat breakfast, and one hour after a meal. 01/05/20   Domenick Gong, MD  glucose monitoring kit (FREESTYLE) monitoring kit 1 each by Does not apply route daily. Check glucose once in the morning before breakfast and 1 hour after a meal 01/05/20   Domenick Gong, MD  hydrALAZINE (APRESOLINE) 50 MG tablet Take 1 tablet (50 mg total) by mouth 2 (two) times daily. 01/05/20   Domenick Gong, MD  lidocaine (LIDODERM) 5 % Place 1 patch onto the skin daily. Remove & Discard patch within 12 hours or as directed by MD Patient not taking: Reported on 11/10/2019 09/30/19   Palumbo, April, MD  metFORMIN (GLUCOPHAGE) 500 MG tablet Take 1.5 tablets (750 mg total) by mouth 2 (two) times daily with a meal. 10/27/19 11/26/19  Geoffery Lyons, MD   methocarbamol (ROBAXIN) 500 MG tablet Take 1 tablet (500 mg total) by mouth 4 (four) times daily. 01/05/20   Domenick Gong, MD  oxyCODONE-acetaminophen (PERCOCET/ROXICET) 5-325 MG tablet Take 1 tablet by mouth every 4 (four) hours as needed. 11/27/19   Tegeler, Canary Brim, MD  oxyCODONE-acetaminophen (PERCOCET/ROXICET) 5-325 MG tablet Take 1 tablet by mouth every 6 (six) hours as needed for severe pain. 04/12/20   Vanetta Mulders, MD  potassium chloride (MICRO-K) 10 MEQ CR capsule Take 10 mEq by mouth daily. 03/16/19   [provider]  rosuvastatin (CRESTOR) 10 MG tablet Take 10 mg by mouth at bedtime. 03/22/19   [provider]  MYRBETRIQ 25 MG TB24 tablet Take 25 mg by mouth daily. 02/03/18 01/05/20  [provider]    Allergies    Codeine, Ibuprofen, and Imdur [isosorbide nitrate]  Review of Systems   Review  of Systems Ten systems reviewed and are negative for acute change, except as noted in the HPI.   Physical Exam Updated Vital Signs There were no vitals taken for this visit.  Physical Exam Vitals and nursing note reviewed.  Constitutional:      General: She is not in acute distress.    Appearance: She is well-developed. She is not diaphoretic.     Comments: Patient refuses to let us move her from the stretcher.  She is able to get up on her own room but is yelling and screaming about her pain.  Patient also yelled and screamed about IV insertion in the right hand.  HENT:     Head: Normocephalic and atraumatic.  Eyes:     General: No scleral icterus.    Conjunctiva/sclera: Conjunctivae normal.  Cardiovascular:     Rate and Rhythm: Normal rate and regular rhythm.     Heart sounds: Normal heart sounds. No murmur heard. No friction rub. No gallop.   Pulmonary:     Effort: Pulmonary effort is normal. No respiratory distress.     Breath sounds: Normal breath sounds.  Abdominal:     General: Bowel sounds are normal. There is no distension.      Palpations: Abdomen is soft. There is no mass.     Tenderness: There is no abdominal tenderness. There is no guarding.  Musculoskeletal:     Cervical back: Normal range of motion.  Skin:    General: Skin is warm and dry.  Neurological:     Mental Status: She is alert and oriented to person, place, and time.     Comments: Normal strength in the bilateral lower and upper extremities with grip strength, dorsi and plantar flexion.  DP and PT pulse 2+ along with 2+ radial pulses.  Psychiatric:        Behavior: Behavior normal.     ED Results / Procedures / Treatments   Labs (all labs ordered are listed, but only abnormal results are displayed) Labs Reviewed  BASIC METABOLIC PANEL  CBC WITH DIFFERENTIAL/PLATELET  CBG MONITORING, ED    EKG EKG Interpretation  Date/Time:  Monday April 18 2020 14:17:08 EDT Ventricular Rate:  72 PR Interval:    QRS Duration: 74 QT Interval:  395 QTC Calculation: 433 R Axis:   38 Text Interpretation: Sinus rhythm Borderline T wave abnormalities Confirmed by Lennice Sites [phone]) on 04/18/2020 2:58:29 PM  Radiology No results found.  Procedures Procedures   Medications Ordered in ED Medications - No data to display  ED Course  I have reviewed the triage vital signs and the nursing notes.  Pertinent labs & imaging results that were available during my care of the patient were reviewed by me and considered in my medical decision making (see chart for details).    MDM Rules/Calculators/A&P                         Patient has been seen more than 10 times in the last 6 months.  Unfortunately she is a very poor historian with poor insight.  I had a long discussion with the patient who states that she has been trying to follow-up with her primary care doctor who is at [facility] however she states that people frequently are not there, they will pick up the phone and she is unable to follow-up.  I explained to her that I believe her pain  in her feet is probably peripheral  neuropathy and considering the fact that she comes in every time with high blood sugar the best thing she can do to manage her pain is get her blood sugars under better control and this would be done with her primary care physician.  The patient uses the bus to get to her appointments however she has significant difficulty ambulating due to pain and balance issues.  I consulted with our caseworker Lucia Bitter.  She has gotten her a follow-up appointment with PCP, transportation, and I have ordered home health for skilled nursing, PT OT and social work.  I will order gabapentin for control of her pain.  She is advised to follow closely with primary care physician.  I ordered and reviewed labs which shows chronic renal insufficiency unchanged on BMP and a blood sugar of 360.  EKG shows sinus rhythm at a rate of 72   Discussed return precautions. Final Clinical Impression(s) / ED Diagnoses Final diagnoses:  None    Rx / DC Orders ED Discharge Orders    None       Deasiah, Hagberg, PA-C 04/18/20 1635    Lennice Sites, DO 04/18/20 1847

## 2020-04-18 NOTE — ED Notes (Signed)
Patient discharge instructions reviewed with the patient. The patient verbalized understanding. Patient discharged. 

## 2020-04-18 NOTE — Discharge Instructions (Signed)
Get help right away if: You cannot urinate. You have sudden weakness or loss of coordination. You have trouble speaking. You have pain or pressure in your chest. You have an irregular heartbeat. You have sudden inability to move a part of your body.

## 2020-04-20 ENCOUNTER — Other Ambulatory Visit: Payer: Self-pay

## 2020-04-20 ENCOUNTER — Encounter (HOSPITAL_COMMUNITY): Payer: Self-pay

## 2020-04-20 ENCOUNTER — Emergency Department (HOSPITAL_COMMUNITY): Payer: Medicare Other

## 2020-04-20 ENCOUNTER — Emergency Department (HOSPITAL_COMMUNITY)
Admission: EM | Admit: 2020-04-20 | Discharge: 2020-04-20 | Disposition: A | Discharge status: Home / Self Care | Payer: Medicare Other | Attending: Emergency Medicine | Admitting: Emergency Medicine

## 2020-04-20 DIAGNOSIS — M79604 Pain in right leg: Secondary | ICD-10-CM | POA: Insufficient documentation

## 2020-04-20 DIAGNOSIS — Z79899 Other long term (current) drug therapy: Secondary | ICD-10-CM | POA: Diagnosis not present

## 2020-04-20 DIAGNOSIS — I10 Essential (primary) hypertension: Secondary | ICD-10-CM | POA: Diagnosis not present

## 2020-04-20 DIAGNOSIS — R6889 Other general symptoms and signs: Secondary | ICD-10-CM | POA: Diagnosis not present

## 2020-04-20 DIAGNOSIS — Z7902 Long term (current) use of antithrombotics/antiplatelets: Secondary | ICD-10-CM | POA: Diagnosis not present

## 2020-04-20 DIAGNOSIS — Z8616 Personal history of COVID-19: Secondary | ICD-10-CM | POA: Diagnosis not present

## 2020-04-20 DIAGNOSIS — R000 Tachycardia, unspecified: Secondary | ICD-10-CM | POA: Diagnosis not present

## 2020-04-20 DIAGNOSIS — Z7984 Long term (current) use of oral hypoglycemic drugs: Secondary | ICD-10-CM | POA: Insufficient documentation

## 2020-04-20 DIAGNOSIS — R519 Headache, unspecified: Secondary | ICD-10-CM | POA: Diagnosis not present

## 2020-04-20 DIAGNOSIS — M25552 Pain in left hip: Secondary | ICD-10-CM | POA: Insufficient documentation

## 2020-04-20 DIAGNOSIS — M79605 Pain in left leg: Secondary | ICD-10-CM | POA: Insufficient documentation

## 2020-04-20 DIAGNOSIS — M542 Cervicalgia: Secondary | ICD-10-CM | POA: Insufficient documentation

## 2020-04-20 DIAGNOSIS — M79671 Pain in right foot: Secondary | ICD-10-CM | POA: Diagnosis not present

## 2020-04-20 DIAGNOSIS — E119 Type 2 diabetes mellitus without complications: Secondary | ICD-10-CM | POA: Diagnosis not present

## 2020-04-20 DIAGNOSIS — E1165 Type 2 diabetes mellitus with hyperglycemia: Secondary | ICD-10-CM | POA: Diagnosis not present

## 2020-04-20 DIAGNOSIS — R52 Pain, unspecified: Secondary | ICD-10-CM

## 2020-04-20 DIAGNOSIS — Z7982 Long term (current) use of aspirin: Secondary | ICD-10-CM | POA: Insufficient documentation

## 2020-04-20 DIAGNOSIS — Z743 Need for continuous supervision: Secondary | ICD-10-CM | POA: Diagnosis not present

## 2020-04-20 DIAGNOSIS — M79672 Pain in left foot: Secondary | ICD-10-CM | POA: Insufficient documentation

## 2020-04-20 DIAGNOSIS — R531 Weakness: Secondary | ICD-10-CM | POA: Diagnosis not present

## 2020-04-20 MED ORDER — OXYCODONE-ACETAMINOPHEN 5-325 MG PO TABS: 1.0000 | ORAL_TABLET | Freq: Once | ORAL | Status: DC

## 2020-04-20 MED ORDER — ACETAMINOPHEN 500 MG PO TABS
1000.0000 mg | ORAL_TABLET | Freq: Once | ORAL | Status: AC
  Administered 2020-04-20: 1000 mg via ORAL
  Filled 2020-04-20: qty 2

## 2020-04-20 MED ORDER — GABAPENTIN 300 MG PO CAPS
300.0000 mg | ORAL_CAPSULE | Freq: Once | ORAL | Status: AC
  Administered 2020-04-20: 300 mg via ORAL
  Filled 2020-04-20: qty 1

## 2020-04-20 NOTE — ED Notes (Signed)
SW to assist patient home by [facility].

## 2020-04-20 NOTE — ED Notes (Signed)
Pt refused to change blouse into a hospital gown. Pt was informed that she would have to put on a hospital gown if imaging was to be completed

## 2020-04-20 NOTE — Discharge Instructions (Addendum)
You were seen in the emergency department for pain all over including her left hip, legs  X-ray of your hip today did not show any injuries fractures or dislocations  Increase your gabapentin to 300 mg 3 times a day  Go to your primary care doctor appointment in 2 days to discuss your ongoing pain  Return to the ED for worsening or new symptoms

## 2020-04-20 NOTE — ED Provider Notes (Signed)
[facility] EMERGENCY DEPARTMENT Provider Note   CSN: [id number] Arrival date & time: 04/20/20  1601     History Chief Complaint  Patient presents with  . Weakness    l  . Leg Pain    Also c/o left arm pain, this all started last night, also c/o speech disturbances that occurred last night that are now resolved - pt states after dinner and before bed. EMS states no stroke like s/s per Tivis Ringer, paramedic    [name] is a 75 y.o. female presents to the ED for evaluation of "pain in my whole body".  Specifically reports pain in both of her legs, feet, left hip, neck.  Also has a headache.  Also asking if her BP is high today.  Also reports her feet, legs are numb.  Patient is a poor historian.  Frequently tells me that I need to check her records for more information.  Reports her pain has been going on for a "long time".  [facility]  She was seen in the ER October 2021 for bilateral hand and left arm numbness and pain, neck pain.  MRI at that time showed critical spinal stenosis with spinal cord compression and was admitted by neurosurgery.  She had anterior cervical decompression and fusion of C3-4, C4-5, C5-6, and C6-7 levels.  Patient was seen in the ED 48 hours ago for diffuse body pain worse on the left side, leg pain, and was prescribed gabapentin.  8 days ago was seen in the ER for the same and was prescribed oxycodone for chronic pain.  Reports the medicine the doctor prescribed her recently was "not strong enough for the pain all over my body".  When asked which medicine this is she doesn't know but asks me to look through her purse for the yellow pill. This medicine is called gabapentin.  States she doesn't want that "addicitve medicine".  She has appointment with PCP on Friday. EDPA at last ER visit had consulted SW and had arranged and PCP follow up and transportation to that appointment. Patient states she does know someone is  coming to her house in 2 days for her appointment. Home health RN, PT and OT was also ordered. Patient states they have not come yet.  Patient lives alone. She says she doesn't use a walker in her home because walking around her house "doesn't hurt" but if she tries to walk longer distances like to her mailbox she has worsening pain.   She does not disclose any other concerns today.    HPI     Past Medical History:  Diagnosis Date  . Diabetes mellitus without complication ([place])   . GERD 09/04/2006   Qualifier: Diagnosis of  By: Suzie Portela    . History of echocardiogram    a. 2D ECHO: 11/06/2013 EF 65%; no WMA. Mild TR. PA pk pressure 43 mm HG  . Hypertension   . Stroke [facility])     Patient Active Problem List   Diagnosis Date Noted  . Cervical stenosis of spine 11/10/2019  . Acute respiratory disease due to COVID-19 virus 05/23/2018  . NSTEMI (non-ST elevated myocardial infarction) ([place]) 11/05/2013  . Spinal stenosis of cervical region 10/20/2012  . Prolonged QT interval 10/18/2012  . Left sided numbness 10/18/2012  . Palpitations 10/18/2012  . 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 Numbness 06/20/2012  . Diabetes mellitus ([place]) 06/20/2012  . CVA (cerebral infarction) 06/20/2012  .  Hypokalemia 06/20/2012  . ARTHRALGIA 01/28/2009  . CHEST PAIN UNSPECIFIED 11/26/2008  . DEPRESSION 09/04/2006  . Essential hypertension 09/04/2006  . GERD 09/04/2006  . MULTIPLE SITES, ARTHRITIS-ACUTE/CHRONIC 09/04/2006  . LOW BACK PAIN, CHRONIC 09/04/2006    Past Surgical History:  Procedure Laterality Date  . ABDOMINAL HYSTERECTOMY    . ANTERIOR CERVICAL DECOMPRESSION/DISCECTOMY FUSION 4 LEVELS N/A 11/11/2019   Procedure: Cervical three-four Cervical four-five Cervical five-six Cervical six-seven  Anterior cervical decompression/discectomy/fusion;  Surgeon: Erline Levine, MD;  Location: [facility];  Service: Neurosurgery;  Laterality: N/A;  . LEFT HEART CATHETERIZATION WITH CORONARY  ANGIOGRAM N/A 11/05/2013   Procedure: LEFT HEART CATHETERIZATION WITH CORONARY ANGIOGRAM;  Surgeon: Troy Sine, MD;  Location: [facility] CATH LAB;  Service: Cardiovascular;  Laterality: N/A;     OB History   No obstetric history on file.     History reviewed. No pertinent family history.  Social History   Tobacco Use  . Smoking status: Never Smoker  . Smokeless tobacco: Never Used  Substance Use Topics  . Alcohol use: No  . Drug use: No    Home Medications Prior to Admission medications   Medication Sig Start Date End Date Taking? Authorizing Provider  albuterol (VENTOLIN HFA) 108 (90 Base) MCG/ACT inhaler Inhale 1-2 puffs into the lungs every 6 (six) hours as needed for wheezing or shortness of breath. 10/20/19   Jaynee Eagles, PA-C  aspirin EC 81 MG tablet Take 81 mg by mouth daily. Swallow whole.    [provider]  chlorthalidone (HYGROTON) 25 MG tablet Take 1 tablet (25 mg total) by mouth daily. 01/05/20   Melynda Ripple, MD  cloNIDine (CATAPRES) 0.2 MG tablet Take 1 tablet (0.2 mg total) by mouth 2 (two) times daily. 12/10/19   Jaynee Eagles, PA-C  clopidogrel (PLAVIX) 75 MG tablet Take 1 tablet (75 mg total) by mouth daily. 01/05/20   Melynda Ripple, MD  gabapentin (NEURONTIN) 100 MG capsule Take 1 capsule (100 mg total) by mouth 3 (three) times daily. 04/18/20 06/17/20  Margarita Mail, PA-C  glucose blood test strip Check your sugar in the morning before you eat breakfast, and one hour after a meal. 01/05/20   Melynda Ripple, MD  glucose monitoring kit (FREESTYLE) monitoring kit 1 each by Does not apply route daily. Check glucose once in the morning before breakfast and 1 hour after a meal 01/05/20   Melynda Ripple, MD  hydrALAZINE (APRESOLINE) 50 MG tablet Take 1 tablet (50 mg total) by mouth 2 (two) times daily. 01/05/20   Melynda Ripple, MD  lidocaine (LIDODERM) 5 % Place 1 patch onto the skin daily. Remove & Discard patch within 12 hours or as directed by  MD Patient not taking: Reported on 11/10/2019 09/30/19   Palumbo, April, MD  metFORMIN (GLUCOPHAGE) 500 MG tablet Take 1.5 tablets (750 mg total) by mouth 2 (two) times daily with a meal. 10/27/19 11/26/19  Veryl Speak, MD  methocarbamol (ROBAXIN) 500 MG tablet Take 1 tablet (500 mg total) by mouth 4 (four) times daily. 01/05/20   Melynda Ripple, MD  potassium chloride (MICRO-K) 10 MEQ CR capsule Take 10 mEq by mouth daily. 03/16/19   [provider]  rosuvastatin (CRESTOR) 10 MG tablet Take 10 mg by mouth at bedtime. 03/22/19   [provider]  MYRBETRIQ 25 MG TB24 tablet Take 25 mg by mouth daily. 02/03/18 01/05/20  [provider]    Allergies    Codeine, Ibuprofen, and Imdur [isosorbide nitrate]  Review of Systems  Review of Systems  Musculoskeletal: Positive for arthralgias.  All other systems reviewed and are negative.   Physical Exam Updated Vital Signs BP (!) 152/72 (BP Location: Right Arm)   Pulse 72   Temp 98 F (36.7 C) (Oral)   Resp 18   Ht $R'5\' 4"'Mf$  (1.626 m)   Wt 68 kg   SpO2 100%   BMI 25.73 kg/m   Physical Exam Vitals and nursing note reviewed.  Constitutional:      General: She is not in acute distress.    Appearance: She is well-developed.     Comments: NAD.  HENT:     Head: Normocephalic and atraumatic.     Right Ear: External ear normal.     Left Ear: External ear normal.     Nose: Nose normal.  Eyes:     General: No scleral icterus.    Conjunctiva/sclera: Conjunctivae normal.  Cardiovascular:     Rate and Rhythm: Normal rate and regular rhythm.     Heart sounds: Normal heart sounds. No murmur heard.   Pulmonary:     Effort: Pulmonary effort is normal.     Breath sounds: Normal breath sounds. No wheezing.  Abdominal:     Palpations: Abdomen is soft.     Tenderness: There is no abdominal tenderness.  Musculoskeletal:        General: No deformity. Normal range of motion.     Cervical back: Normal range of motion and  neck supple.     Comments: No reproducible bony tenderness of hips, legs. Full ROM of hips bilaterally, no pain. Patient states it hurts when she walks only. No midline or paraspinal muscular tenderness.   Skin:    General: Skin is warm and dry.     Capillary Refill: Capillary refill takes less than 2 seconds.  Neurological:     Mental Status: She is alert and oriented to person, place, and time.     Comments: Strength and sensation in LE intact. Able to do SLR and hold against gravity with both legs   Psychiatric:        Behavior: Behavior normal.        Thought Content: Thought content normal.        Judgment: Judgment normal.     ED Results / Procedures / Treatments   Labs (all labs ordered are listed, but only abnormal results are displayed) Labs Reviewed - No data to display  EKG None  Radiology DG Hip Unilat W or Wo Pelvis 2-3 Views Left  Result Date: 04/20/2020 CLINICAL DATA:  Left hip pain EXAM: DG HIP (WITH OR WITHOUT PELVIS) 2-3V LEFT COMPARISON:  None. FINDINGS: There is no evidence of hip fracture or dislocation. There is moderate bilateral hip osteoarthritis with superior joint space loss and marginal osteophyte formation. Sclerosis seen around the bilateral sacroiliac joints. IMPRESSION: No acute osseous abnormality Electronically Signed   By: Prudencio Pair M.D.   On: 04/20/2020 18:04    Procedures Procedures   Medications Ordered in ED Medications  gabapentin (NEURONTIN) capsule 300 mg (300 mg Oral Given 04/20/20 1819)  acetaminophen (TYLENOL) tablet 1,000 mg (1,000 mg Oral Given 04/20/20 1840)    ED Course  I have reviewed the triage vital signs and the nursing notes.  Pertinent labs & imaging results that were available during my care of the patient were reviewed by me and considered in my medical decision making (see chart for details).    MDM Rules/Calculators/A&P  75 year old female presents to the ED for diffuse body pain.   Specifically reports bilateral leg pain, feet pain, left hip pain.  Also has a headache.  This has been going on for over 1 year after a fall.  Chart review shows patient has been seen frequently for similar complaints.  Seen 48 hours ago for same and was prescribed gabapentin.  ED PA at that time consulted social work who facilitated and scheduled a PCP follow-up in 2 days, transportation to PCP appointment as well as home health RN, PT and OT.  Patient is aware of her upcoming PCP appointment and that she has transportation.  Denies any trauma.  Patient is a poor historian appears to have poor insight, poor medical literacy.  However, does not endorse any red flags like trauma, one-sided weakness or numbness.  Exam is actually very benign.  No reproducible bony tenderness of hips, TL spine or extremities.  She is actually able to move her legs without any pain, stating that her legs only hurt when she tries to walk. No noted weakness.  Given her worsening left-sided hip pain, age x-ray of this was obtained today.  This is nonacute.  Suspect acute on chronic pain vs neuropathy.  I have recommended she increase her gabapentin dose from 100 to 300 mg and add Tylenol.  We will not refill opiate pain medication at this time.  Stressed importance to follow-up with PCP in 2 days.  Reassured her that home health RN, PT and OT have been ordered.  I do not think further emergent lab work, imaging or admission is necessary today.  Case management has facilitated transport back home by over. Final Clinical Impression(s) / ED Diagnoses Final diagnoses:  Diffuse pain  Left hip pain    Rx / DC Orders ED Discharge Orders    None       Kinnie Feil, PA-C 04/20/20 1959    Tegeler, Gwenyth Allegra, MD 04/21/20 0000

## 2020-04-20 NOTE — Social Work (Signed)
CSW arranged for transportation to Pt's daughter's home via [employer]

## 2020-04-20 NOTE — ED Triage Notes (Signed)
Told EMS she started feeling sick last night and was here as an inpatient last week, now c/o generalized weakness and pain in her left arm and leg, denies n/v/d

## 2020-04-20 NOTE — ED Notes (Signed)
Pt refuses to undress or to put on a hospital gown.  States last time she was here that she did not have to.  Pt c/o pain level of 10, laughing at her own jokes, no distress, not oriented to month or year.  Oriented to self and place

## 2020-04-20 NOTE — ED Notes (Signed)
ED Provider at bedside. 

## 2020-04-20 NOTE — ED Notes (Signed)
RN reviewed discharge pw again w/ pt.  Highlighted medications given for pt per her request.  SW working on getting pt home.

## 2020-04-22 ENCOUNTER — Other Ambulatory Visit: Payer: Self-pay

## 2020-04-22 ENCOUNTER — Ambulatory Visit (INDEPENDENT_AMBULATORY_CARE_PROVIDER_SITE_OTHER): Payer: Medicare Other | Admitting: Nurse Practitioner

## 2020-04-22 ENCOUNTER — Encounter: Payer: Self-pay | Admitting: Nurse Practitioner

## 2020-04-22 VITALS — BP 207/59 | HR 82 | Temp 97.7°F

## 2020-04-22 DIAGNOSIS — M25512 Pain in left shoulder: Secondary | ICD-10-CM

## 2020-04-22 DIAGNOSIS — M25552 Pain in left hip: Secondary | ICD-10-CM | POA: Diagnosis not present

## 2020-04-22 DIAGNOSIS — E0800 Diabetes mellitus due to underlying condition with hyperosmolarity without nonketotic hyperglycemic-hyperosmolar coma (NKHHC): Secondary | ICD-10-CM

## 2020-04-22 DIAGNOSIS — I10 Essential (primary) hypertension: Secondary | ICD-10-CM | POA: Diagnosis not present

## 2020-04-22 DIAGNOSIS — Z7689 Persons encountering health services in other specified circumstances: Secondary | ICD-10-CM | POA: Diagnosis not present

## 2020-04-22 DIAGNOSIS — G8929 Other chronic pain: Secondary | ICD-10-CM

## 2020-04-22 LAB — POCT GLYCOSYLATED HEMOGLOBIN (HGB A1C)
HbA1c POC (<> result, manual entry): 10.5 % (ref 4.0–5.6)
HbA1c, POC (controlled diabetic range): 10.5 % — AB (ref 0.0–7.0)
HbA1c, POC (prediabetic range): 10.5 % — AB (ref 5.7–6.4)
Hemoglobin A1C: 10.5 % — AB (ref 4.0–5.6)

## 2020-04-22 MED ORDER — CHLORTHALIDONE 25 MG PO TABS: 25.0000 mg | ORAL_TABLET | Freq: Every day | ORAL | 0 refills | Status: DC

## 2020-04-22 MED ORDER — CLOPIDOGREL BISULFATE 75 MG PO TABS: 75.0000 mg | ORAL_TABLET | Freq: Every day | ORAL | 0 refills | Status: DC

## 2020-04-22 MED ORDER — METHOCARBAMOL 500 MG PO TABS: 500.0000 mg | ORAL_TABLET | Freq: Four times a day (QID) | ORAL | 0 refills | Status: DC

## 2020-04-22 MED ORDER — LIDOCAINE 5 % EX PTCH
1.0000 | MEDICATED_PATCH | CUTANEOUS | 3 refills | Status: DC
Start: 2020-04-22 — End: 2020-10-05

## 2020-04-22 MED ORDER — FREESTYLE SYSTEM KIT
1.0000 | PACK | Freq: Every day | 0 refills | Status: DC
Start: 2020-04-22 — End: 2020-04-25

## 2020-04-22 MED ORDER — GLIPIZIDE 5 MG PO TABS: 5.0000 mg | ORAL_TABLET | Freq: Two times a day (BID) | ORAL | 3 refills | Status: DC

## 2020-04-22 MED ORDER — ALBUTEROL SULFATE HFA 108 (90 BASE) MCG/ACT IN AERS: 1.0000 | INHALATION_SPRAY | Freq: Four times a day (QID) | RESPIRATORY_TRACT | 0 refills | Status: DC | PRN

## 2020-04-22 MED ORDER — ROSUVASTATIN CALCIUM 10 MG PO TABS
10.0000 mg | ORAL_TABLET | Freq: Every day | ORAL | 3 refills | Status: DC
Start: 2020-04-22 — End: 2020-10-05

## 2020-04-22 MED ORDER — HYDRALAZINE HCL 50 MG PO TABS: 50.0000 mg | ORAL_TABLET | Freq: Two times a day (BID) | ORAL | 0 refills | Status: DC

## 2020-04-22 NOTE — Patient Instructions (Signed)
Health Maintenance, Female Adopting a healthy lifestyle and getting preventive care are important in promoting health and wellness. Ask your health care provider about:  The right schedule for you to have regular tests and exams.  Things you can do on your own to prevent diseases and keep yourself healthy. What should I know about diet, weight, and exercise? Eat a healthy diet  Eat a diet that includes plenty of vegetables, fruits, low-fat dairy products, and lean protein.  Do not eat a lot of foods that are high in solid fats, added sugars, or sodium.   Maintain a healthy weight Body mass index (BMI) is used to identify weight problems. It estimates body fat based on height and weight. Your health care provider can help determine your BMI and help you achieve or maintain a healthy weight. Get regular exercise Get regular exercise. This is one of the most important things you can do for your health. Most adults should:  Exercise for at least 150 minutes each week. The exercise should increase your heart rate and make you sweat (moderate-intensity exercise).  Do strengthening exercises at least twice a week. This is in addition to the moderate-intensity exercise.  Spend less time sitting. Even light physical activity can be beneficial. Watch cholesterol and blood lipids Have your blood tested for lipids and cholesterol at 74 years of age, then have this test every 5 years. Have your cholesterol levels checked more often if:  Your lipid or cholesterol levels are high.  You are older than 74 years of age.  You are at high risk for heart disease. What should I know about cancer screening? Depending on your health history and family history, you may need to have cancer screening at various ages. This may include screening for:  Breast cancer.  Cervical cancer.  Colorectal cancer.  Skin cancer.  Lung cancer. What should I know about heart disease, diabetes, and high blood  pressure? Blood pressure and heart disease  High blood pressure causes heart disease and increases the risk of stroke. This is more likely to develop in people who have high blood pressure readings, are of African descent, or are overweight.  Have your blood pressure checked: ? Every 3-5 years if you are 18-39 years of age. ? Every year if you are 40 years old or older. Diabetes Have regular diabetes screenings. This checks your fasting blood sugar level. Have the screening done:  Once every three years after age 40 if you are at a normal weight and have a low risk for diabetes.  More often and at a younger age if you are overweight or have a high risk for diabetes. What should I know about preventing infection? Hepatitis B If you have a higher risk for hepatitis B, you should be screened for this virus. Talk with your health care provider to find out if you are at risk for hepatitis B infection. Hepatitis C Testing is recommended for:  Everyone born from 1945 through 1965.  Anyone with known risk factors for hepatitis C. Sexually transmitted infections (STIs)  Get screened for STIs, including gonorrhea and chlamydia, if: ? You are sexually active and are younger than 74 years of age. ? You are older than 74 years of age and your health care provider tells you that you are at risk for this type of infection. ? Your sexual activity has changed since you were last screened, and you are at increased risk for chlamydia or gonorrhea. Ask your health care provider   if you are at risk.  Ask your health care provider about whether you are at high risk for HIV. Your health care provider may recommend a prescription medicine to help prevent HIV infection. If you choose to take medicine to prevent HIV, you should first get tested for HIV. You should then be tested every 3 months for as long as you are taking the medicine. Pregnancy  If you are about to stop having your period (premenopausal) and  you may become pregnant, seek counseling before you get pregnant.  Take 400 to 800 micrograms (mcg) of folic acid every day if you become pregnant.  Ask for birth control (contraception) if you want to prevent pregnancy. Osteoporosis and menopause Osteoporosis is a disease in which the bones lose minerals and strength with aging. This can result in bone fractures. If you are 23 years old or older, or if you are at risk for osteoporosis and fractures, ask your health care provider if you should:  Be screened for bone loss.  Take a calcium or vitamin D supplement to lower your risk of fractures.  Be given hormone replacement therapy (HRT) to treat symptoms of menopause. Follow these instructions at home: Lifestyle  Do not use any products that contain nicotine or tobacco, such as cigarettes, e-cigarettes, and chewing tobacco. If you need help quitting, ask your health care provider.  Do not use street drugs.  Do not share needles.  Ask your health care provider for help if you need support or information about quitting drugs. Alcohol use  Do not drink alcohol if: ? Your health care provider tells you not to drink. ? You are pregnant, may be pregnant, or are planning to become pregnant.  If you drink alcohol: ? Limit how much you use to 0-1 drink a day. ? Limit intake if you are breastfeeding.  Be aware of how much alcohol is in your drink. In the [place] one drink equals one 12 oz bottle of beer (355 mL), one 5 oz glass of wine (148 mL), or one 1 oz glass of hard liquor (44 mL). General instructions  Schedule regular health, dental, and eye exams.  Stay current with your vaccines.  Tell your health care provider if: ? You often feel depressed. ? You have ever been abused or do not feel safe at home. Summary  Adopting a healthy lifestyle and getting preventive care are important in promoting health and wellness.  Follow your health care provider's instructions about healthy  diet, exercising, and getting tested or screened for diseases.  Follow your health care provider's instructions on monitoring your cholesterol and blood pressure. This information is not intended to replace advice given to you by your health care provider. Make sure you discuss any questions you have with your health care provider. Document Revised: 01/01/2018 Document Reviewed: 01/01/2018 Elsevier Patient Education  2021 Elsevier Inc.    Anterior Cervical Diskectomy and Fusion, Care After This sheet gives you information about how to care for yourself after your procedure. Your health care provider may also give you more specific instructions. If you have problems or questions, contact your health care provider. What can I expect after the procedure? After the procedure, it is common to have:  Neck pain.  Discomfort when swallowing.  Slight hoarseness. Follow these instructions at home: Medicines  Take over-the-counter and prescription medicines only as told by your health care provider.  If you were prescribed an antibiotic medicine, take it or use it as told by your  health care provider. Do not stop using the antibiotic even if you start to feel better.  Ask your health care provider if the medicine prescribed to you: ? Requires you to avoid driving or using heavy machinery. ? Can cause constipation. You may need to take actions to prevent or treat constipation, such as:  Drink enough fluid to keep your urine pale yellow.  Take over-the-counter or prescription medicines.  Eat foods that are high in fiber, such as beans, whole grains, and fresh fruits and vegetables.  Limit foods that are high in fat and processed sugars, such as fried and sweet foods.  If you are taking blood thinners: ? Talk with your health care provider before you take any medicines that contain aspirin or NSAIDs, such as ibuprofen. These medicines increase your risk for dangerous bleeding. ? Take your  medicine exactly as told, at the same time every day. ? Avoid activities that could cause injury or bruising, and follow instructions about how to prevent falls. ? Wear a medical alert bracelet or carry a card that lists what medicines you take. If you have a neck brace:  Wear the brace as told by your health care provider. Remove it only as told by your health care provider.  Loosen the brace if your arms/fingers tingle, become numb, or turn cold and blue.  Keep the brace clean.  If the brace is not waterproof: ? Do not let it get wet. ? Cover it with a watertight covering when you take a bath or shower. Incision care  Follow instructions from your health care provider about how to take care of your incision. Make sure you: ? Wash your hands with soap and water for at least 20 seconds before and after you change your bandage (dressing). If soap and water are not available, use hand sanitizer. ? Change your dressing as told by your health care provider. ? Leave stitches (sutures), skin glue, or adhesive strips in place. These skin closures may need to stay in place for 2 weeks or longer. If adhesive strip edges start to loosen and curl up, you may trim the loose edges. Do not remove adhesive strips completely unless your health care provider tells you to do that.  Check your incision area every day for signs of infection. Check for: ? Redness, swelling, or more pain. ? Fluid or blood. ? Warmth. ? Pus or a bad smell.   Managing pain, stiffness, and swelling If directed, put ice on the injured area.  If you have a removable brace, remove it as told by your health care provider.  Put ice in a plastic bag.  Place a towel between your skin and the bag.  Leave the ice on for 20 minutes, 2-3 times a day.  Remove the ice if your skin turns bright red. This is very important. If you cannot feel pain, heat, or cold, you have a greater risk of damage to the area.   Activity  Rest as told  by your health care provider.  Avoid sitting for a long time without moving. Get up to take short walks every 1-2 hours. This is important to improve blood flow and breathing. Ask for help if you feel weak or unsteady.  Do not lift anything that is heavier than 10 lb (4.5 kg), or the limit that you are told, until your health care provider says that it is safe.  Do exercises as told by your health care provider.  Do not take baths,  swim, or use a hot tub until your health care provider approves. Ask your health care provider if you may take showers. You may only be allowed to take sponge baths.  Return to your normal activities as told by your health care provider. Ask your health care provider what activities are safe for you.   General instructions  Do not use any products that contain nicotine or tobacco, such as cigarettes, e-cigarettes, and chewing tobacco. These can delay bone healing. If you need help quitting, ask your health care provider.  Keep all follow-up visits. This is important. Follow-up visits include visits for physical therapy. Contact a health care provider if you have:  A fever.  Redness, swelling, or more pain around your incision.  Fluid or blood coming from your incision.  Pus or a bad smell coming from your incision.  Pain that is not controlled by your pain medicine.  Increasing hoarseness or trouble swallowing. Get help right away if you have:  Severe pain.  Sudden numbness or weakness in your arms.  Warmth, tenderness, or swelling in your calf.  Chest pain.  Difficulty breathing. These symptoms may represent a serious problem that is an emergency. Do not wait to see if the symptoms will go away. Get medical help right away. Call your local emergency services (911 in the [place] Do not drive yourself to the hospital. Summary  After the procedure, it is common to have neck pain, discomfort when swallowing, and slight hoarseness.  Follow  instructions from your health care provider about how to take care of your incision.  Check your incision area every day for signs of infection.  Return to your normal activities as told by your health care provider. Ask your health care provider what activities are safe for you.  Contact a health care provider if you have signs of infection at your incision. This information is not intended to replace advice given to you by your health care provider. Make sure you discuss any questions you have with your health care provider. Document Revised: 04/29/2019 Document Reviewed: 04/29/2019 Elsevier Patient Education  2021 [employer].

## 2020-04-22 NOTE — Progress Notes (Signed)
[place], [place]  [address] Phone:  [phone]   Fax:  [phone]   New Patient Office Visit  Subjective:  Patient ID: [name], female    DOB: 09-21-1945  Age: 75 y.o. MRN: [id number]  CC:  Chief Complaint  Patient presents with  . Establish Care    HPI [name] presents to establish care. She  has a past medical history of Diabetes mellitus without complication ([place]), GERD (09/04/2006), History of echocardiogram, Hypertension, and Stroke ([place]).   Patient is here today to establish care.  She has repeated emergency room visits for pain. She lives alone and is a poor historian.  She did suffered a fall at [facility] in September 2021.  She was seen in emergency department.  She later underwent a ACDF for cervical stenosis.  She continues to have left-sided shoulder and arm and leg pain.  She is having some numbness and weakness in her legs.  She is currently on gabapentin however does not like medication overall. She states that she has a primary care doctor with Dr. Meda Coffee she is not interested in establishing care here because it is too far for her to travel.  She was brought in today by aa Melburn Popper which was arranged by social work.  Hypertension Patient is here for follow-up of elevated blood pressure. She is not exercising and unsure of adherent to a low-salt diet. Blood pressure is not monitored at home. Cardiac symptoms: none. Patient denies chest pain, chest pressure/discomfort, claudication, dyspnea, exertional chest pressure/discomfort, irregular heart beat, lower extremity edema, orthopnea, palpitations and paroxysmal nocturnal dyspnea. Cardiovascular risk factors: advanced age (older than 33 for men, 66 for women), diabetes mellitus, dyslipidemia, hypertension and sedentary lifestyle. Use of agents associated with hypertension: none. History of target organ damage: angina/ prior myocardial infarction and stroke.  Diabetes  [place] Patient presents for follow up of diabetes. Current symptoms include: paresthesia of the feet. Symptoms have gradually worsened. Patient denies foot ulcerations, increased appetite, nausea, polydipsia, polyuria and vomiting. Evaluation to date has included: fasting blood sugar and hemoglobin A1C.  Home sugars: patient does not check sugars. Current treatment: Continued sulfonylurea which has been not very effective, Continued metformin which has been not very effective and Continued statin which has been unable to assess effectiveness.   Past Medical History:  Diagnosis Date  . Diabetes mellitus without complication ([place])   . GERD 09/04/2006   Qualifier: Diagnosis of  By: Suzie Portela    . History of echocardiogram    a. 2D ECHO: 11/06/2013 EF 65%; no WMA. Mild TR. PA pk pressure 43 mm HG  . Hypertension   . Stroke [facility])     Past Surgical History:  Procedure Laterality Date  . ABDOMINAL HYSTERECTOMY    . ANTERIOR CERVICAL DECOMPRESSION/DISCECTOMY FUSION 4 LEVELS N/A 11/11/2019   Procedure: Cervical three-four Cervical four-five Cervical five-six Cervical six-seven  Anterior cervical decompression/discectomy/fusion;  Surgeon: Erline Levine, MD;  Location: [facility];  Service: Neurosurgery;  Laterality: N/A;  . LEFT HEART CATHETERIZATION WITH CORONARY ANGIOGRAM N/A 11/05/2013   Procedure: LEFT HEART CATHETERIZATION WITH CORONARY ANGIOGRAM;  Surgeon: Troy Sine, MD;  Location: [facility] CATH LAB;  Service: Cardiovascular;  Laterality: N/A;    No family history on file.  Social History   Socioeconomic History  . Marital status: Widowed    Spouse name: Not on file  . Number of children: Not on file  . Years of education: Not on file  .  Highest education level: Not on file  Occupational History  . Not on file  Tobacco Use  . Smoking status: Never Smoker  . Smokeless tobacco: Never Used  Substance and Sexual Activity  . Alcohol use: No  . Drug use: No  . Sexual activity: Never   Other Topics Concern  . Not on file  Social History Narrative  . Not on file   Social Determinants of Health   Financial Resource Strain: Not on file  Food Insecurity: Not on file  Transportation Needs: Not on file  Physical Activity: Not on file  Stress: Not on file  Social Connections: Not on file  Intimate Partner Violence: Not on file    ROS Review of Systems  Respiratory: Positive for shortness of breath (on exertion).   Cardiovascular:       Left breast/chest pain occasionally since stroke  Gastrointestinal: Negative for constipation, diarrhea and nausea.  Genitourinary:       Occasional pressure drinks water improves   Musculoskeletal: Positive for arthralgias and joint swelling (left shoulder pain).       10/10 hip pain Difficulty  Neurological: Positive for numbness (in hands and feet).  Psychiatric/Behavioral: Positive for sleep disturbance (uses APAP PM).    Objective:   Today's Vitals: BP (!) 207/59   Pulse 82   Temp 97.7 F (36.5 C) (Temporal)   SpO2 97%   Physical Exam HENT:     Head: Normocephalic and atraumatic.     Mouth/Throat:     Mouth: Mucous membranes are moist.  Neck:     Comments: Well-healed surgical scar no signs of infection Cardiovascular:     Rate and Rhythm: Normal rate.     Pulses: Normal pulses.  Pulmonary:     Effort: Pulmonary effort is normal.     Breath sounds: Normal breath sounds.  Abdominal:     Palpations: Abdomen is soft.  Musculoskeletal:     Cervical back: Normal range of motion.     Comments: Wheelchair in use able to get on exam table without difficulty  Skin:    General: Skin is warm and dry.     Capillary Refill: Capillary refill takes less than 2 seconds.  Neurological:     Mental Status: She is alert.     Comments: Poor historian alert to person and city  Psychiatric:        Mood and Affect: Mood normal.     Assessment & Plan:  Computer problems unable to review patient's chart during time of  office visit with the exception of the review of systems Problem List Items Addressed This Visit      Cardiovascular and Mediastinum   Essential hypertension (Chronic) Encouraged on going compliance with current medication regimen Encouraged home monitoring and recording  Eating a heart-healthy diet with less salt Encouraged regular physical activity   Relevant Medications   chlorthalidone (HYGROTON) 25 MG tablet   hydrALAZINE (APRESOLINE) 50 MG tablet   rosuvastatin (CRESTOR) 10 MG tablet     Endocrine   Diabetes mellitus (HCC) (Chronic) Encourage compliance with current treatment regimenEncourage regular CBG monitoring Encourage contacting office if excessive hyperglycemia and or hypoglycemia Lifestyle modification with healthy diet (fewer calories, more high fiber foods, whole grains and non-starchy vegetables, lower fat meat and fish, low-fat diary include healthy oils) regular exercise (physical activity)    Relevant Medications   glipiZIDE (GLUCOTROL) 5 MG tablet   rosuvastatin (CRESTOR) 10 MG tablet   Other Relevant Orders   POCT glycosylated hemoglobin (  Hb A1C) (Completed)    Other Visit Diagnoses    Encounter to establish care    -  Primary Discussed female health maintenance; SBE, annual CBE,  Discussed general safety in vehicle and COVID Discussed regular hydration with water Discussed healthy diet and exercise and weight management Encouraged to call our office for an appointment with in ongoing concerns for questions.    Chronic left shoulder pain   Persistent  Lidoderm patch Referral to pain management needed however patient not interested at this time   Relevant Medications   methocarbamol (ROBAXIN) 500 MG tablet   Hip pain, chronic, left     Persistent   Relevant Medications   methocarbamol (ROBAXIN) 500 MG tablet      Outpatient Encounter Medications as of 04/22/2020  Medication Sig  . aspirin EC 81 MG tablet Take 81 mg by mouth daily. Swallow whole.  .  cloNIDine (CATAPRES) 0.2 MG tablet Take 1 tablet (0.2 mg total) by mouth 2 (two) times daily.  Marland Kitchen gabapentin (NEURONTIN) 100 MG capsule Take 1 capsule (100 mg total) by mouth 3 (three) times daily.  Marland Kitchen glipiZIDE (GLUCOTROL) 5 MG tablet Take 1 tablet (5 mg total) by mouth 2 (two) times daily before a meal.  . lidocaine (LIDODERM) 5 % Place 1 patch onto the skin daily. Remove & Discard patch within 12 hours or as directed by MD  . potassium chloride (MICRO-K) 10 MEQ CR capsule Take 10 mEq by mouth daily.  . [DISCONTINUED] albuterol (VENTOLIN HFA) 108 (90 Base) MCG/ACT inhaler Inhale 1-2 puffs into the lungs every 6 (six) hours as needed for wheezing or shortness of breath.  . [DISCONTINUED] chlorthalidone (HYGROTON) 25 MG tablet Take 1 tablet (25 mg total) by mouth daily.  . [DISCONTINUED] clopidogrel (PLAVIX) 75 MG tablet Take 1 tablet (75 mg total) by mouth daily.  . [DISCONTINUED] glucose blood test strip Check your sugar in the morning before you eat breakfast, and one hour after a meal.  . [DISCONTINUED] glucose monitoring kit (FREESTYLE) monitoring kit 1 each by Does not apply route daily. Check glucose once in the morning before breakfast and 1 hour after a meal  . [DISCONTINUED] hydrALAZINE (APRESOLINE) 50 MG tablet Take 1 tablet (50 mg total) by mouth 2 (two) times daily.  . [DISCONTINUED] lidocaine (LIDODERM) 5 % Place 1 patch onto the skin daily. Remove & Discard patch within 12 hours or as directed by MD  . [DISCONTINUED] methocarbamol (ROBAXIN) 500 MG tablet Take 1 tablet (500 mg total) by mouth 4 (four) times daily.  . [DISCONTINUED] rosuvastatin (CRESTOR) 10 MG tablet Take 10 mg by mouth at bedtime.  Marland Kitchen albuterol (VENTOLIN HFA) 108 (90 Base) MCG/ACT inhaler Inhale 1-2 puffs into the lungs every 6 (six) hours as needed for wheezing or shortness of breath.  . chlorthalidone (HYGROTON) 25 MG tablet Take 1 tablet (25 mg total) by mouth daily.  . clopidogrel (PLAVIX) 75 MG tablet Take 1 tablet  (75 mg total) by mouth daily.  . hydrALAZINE (APRESOLINE) 50 MG tablet Take 1 tablet (50 mg total) by mouth 2 (two) times daily.  . metFORMIN (GLUCOPHAGE) 500 MG tablet Take 1.5 tablets (750 mg total) by mouth 2 (two) times daily with a meal.  . methocarbamol (ROBAXIN) 500 MG tablet Take 1 tablet (500 mg total) by mouth 4 (four) times daily.  . rosuvastatin (CRESTOR) 10 MG tablet Take 1 tablet (10 mg total) by mouth at bedtime.  . [DISCONTINUED] glucose monitoring kit (FREESTYLE) monitoring kit 1 each by  Does not apply route daily. Check glucose once in the morning before breakfast and 1 hour after a meal  . [DISCONTINUED] MYRBETRIQ 25 MG TB24 tablet Take 25 mg by mouth daily.   No facility-administered encounter medications on file as of 04/22/2020.    Follow-up: Return in about 6 weeks (around 06/03/2020).   Vevelyn Francois, NP

## 2020-04-22 NOTE — Progress Notes (Signed)
Established Patient Office Visit  Subjective:  Patient ID: [name], female    DOB: 23-Mar-1945  Age: 75 y.o. MRN: [id number]  CC:  Chief Complaint  Patient presents with  . Establish Care    HPI [name] presents for establish care  Past Medical History:  Diagnosis Date  . Diabetes mellitus without complication ([place])   . GERD 09/04/2006   Qualifier: Diagnosis of  By: Suzie Portela    . History of echocardiogram    a. 2D ECHO: 11/06/2013 EF 65%; no WMA. Mild TR. PA pk pressure 43 mm HG  . Hypertension   . Stroke [facility])     Past Surgical History:  Procedure Laterality Date  . ABDOMINAL HYSTERECTOMY    . ANTERIOR CERVICAL DECOMPRESSION/DISCECTOMY FUSION 4 LEVELS N/A 11/11/2019   Procedure: Cervical three-four Cervical four-five Cervical five-six Cervical six-seven  Anterior cervical decompression/discectomy/fusion;  Surgeon: Erline Levine, MD;  Location: [place];  Service: Neurosurgery;  Laterality: N/A;  . LEFT HEART CATHETERIZATION WITH CORONARY ANGIOGRAM N/A 11/05/2013   Procedure: LEFT HEART CATHETERIZATION WITH CORONARY ANGIOGRAM;  Surgeon: Troy Sine, MD;  Location: [facility] CATH LAB;  Service: Cardiovascular;  Laterality: N/A;    No family history on file.  Social History   Socioeconomic History  . Marital status: Widowed    Spouse name: Not on file  . Number of children: Not on file  . Years of education: Not on file  . Highest education level: Not on file  Occupational History  . Not on file  Tobacco Use  . Smoking status: Never Smoker  . Smokeless tobacco: Never Used  Substance and Sexual Activity  . Alcohol use: No  . Drug use: No  . Sexual activity: Never  Other Topics Concern  . Not on file  Social History Narrative  . Not on file   Social Determinants of Health   Financial Resource Strain: Not on file  Food Insecurity: Not on file  Transportation Needs: Not on file  Physical Activity: Not on file  Stress: Not on file  Social Connections:  Not on file  Intimate Partner Violence: Not on file    Outpatient Medications Prior to Visit  Medication Sig Dispense Refill  . albuterol (VENTOLIN HFA) 108 (90 Base) MCG/ACT inhaler Inhale 1-2 puffs into the lungs every 6 (six) hours as needed for wheezing or shortness of breath. 18 g 0  . aspirin EC 81 MG tablet Take 81 mg by mouth daily. Swallow whole.    . chlorthalidone (HYGROTON) 25 MG tablet Take 1 tablet (25 mg total) by mouth daily. 30 tablet 0  . cloNIDine (CATAPRES) 0.2 MG tablet Take 1 tablet (0.2 mg total) by mouth 2 (two) times daily. 60 tablet 0  . clopidogrel (PLAVIX) 75 MG tablet Take 1 tablet (75 mg total) by mouth daily. 30 tablet 0  . gabapentin (NEURONTIN) 100 MG capsule Take 1 capsule (100 mg total) by mouth 3 (three) times daily. 90 capsule 1  . glucose blood test strip Check your sugar in the morning before you eat breakfast, and one hour after a meal. 100 each 2  . glucose monitoring kit (FREESTYLE) monitoring kit 1 each by Does not apply route daily. Check glucose once in the morning before breakfast and 1 hour after a meal 1 each 0  . hydrALAZINE (APRESOLINE) 50 MG tablet Take 1 tablet (50 mg total) by mouth 2 (two) times daily. 30 tablet 0  . lidocaine (LIDODERM) 5 % Place 1 patch onto the  skin daily. Remove & Discard patch within 12 hours or as directed by MD 30 patch 0  . methocarbamol (ROBAXIN) 500 MG tablet Take 1 tablet (500 mg total) by mouth 4 (four) times daily. 40 tablet 0  . potassium chloride (MICRO-K) 10 MEQ CR capsule Take 10 mEq by mouth daily.    . rosuvastatin (CRESTOR) 10 MG tablet Take 10 mg by mouth at bedtime.    . metFORMIN (GLUCOPHAGE) 500 MG tablet Take 1.5 tablets (750 mg total) by mouth 2 (two) times daily with a meal. 90 tablet 0   No facility-administered medications prior to visit.    Allergies  Allergen Reactions  . Codeine Other (See Comments)    REACTION: Hallucinations  . Ibuprofen Other (See Comments)    REACTION: Elevated  Blood Pressure  . Imdur [Isosorbide Nitrate] Other (See Comments)    Headache & upset stomach    ROS Review of Systems    Objective:    Physical Exam Musculoskeletal:       Feet:  Feet:     Right foot:     Protective Sensation: 10 sites tested. 10 sites sensed.     Skin integrity: Skin integrity normal.     Left foot:     Protective Sensation: 10 sites tested. 10 sites sensed.     Skin integrity: Skin integrity normal.     BP (!) 207/59   Pulse 82   Temp 97.7 F (36.5 C) (Temporal)   SpO2 97%  Wt Readings from Last 3 Encounters:  04/20/20 149 lb 14.6 oz (68 kg)  04/12/20 150 lb (68 kg)  11/11/19 154 lb 5.2 oz (70 kg)     Health Maintenance Due  Topic Date Due  . Hepatitis C Screening  Never done  . COVID-19 Vaccine (1) Never done  . FOOT EXAM  Never done  . OPHTHALMOLOGY EXAM  Never done  . TETANUS/TDAP  Never done  . COLONOSCOPY (Pts 45-62yrs Insurance coverage will need to be confirmed)  Never done  . PNA vac Low Risk Adult (2 of 2 - PCV13) 11/07/2014  . MAMMOGRAM  09/01/2016    There are no preventive care reminders to display for this patient.  Lab Results  Component Value Date   TSH 1.413 10/18/2012   Lab Results  Component Value Date   WBC 8.5 04/18/2020   HGB 12.2 04/18/2020   HCT 36.9 04/18/2020   MCV 90.9 04/18/2020   PLT 252 04/18/2020   Lab Results  Component Value Date   NA 134 (L) 04/18/2020   K 3.6 04/18/2020   CO2 29 04/18/2020   GLUCOSE 372 (H) 04/18/2020   BUN 13 04/18/2020   CREATININE 1.17 (H) 04/18/2020   BILITOT 0.4 11/27/2019   ALKPHOS 99 11/27/2019   AST 18 11/27/2019   ALT 11 11/27/2019   PROT 7.7 11/27/2019   ALBUMIN 3.5 11/27/2019   CALCIUM 8.7 (L) 04/18/2020   ANIONGAP 10 04/18/2020   Lab Results  Component Value Date   CHOL 189 06/21/2012   Lab Results  Component Value Date   HDL 68 06/21/2012   Lab Results  Component Value Date   LDLCALC 100 (H) 06/21/2012   Lab Results  Component Value Date    TRIG 107 06/21/2012   Lab Results  Component Value Date   CHOLHDL 2.8 06/21/2012   Lab Results  Component Value Date   HGBA1C 10.5 (A) 04/22/2020   HGBA1C 10.5 04/22/2020   HGBA1C 10.5 (A) 04/22/2020   HGBA1C 10.5 (  A) 04/22/2020      Assessment & Plan:   Problem List Items Addressed This Visit      Endocrine   Diabetes mellitus ([place]) (Chronic)   Relevant Orders   POCT glycosylated hemoglobin (Hb A1C) (Completed)    Other Visit Diagnoses    Encounter to establish care    -  Primary   Chronic left shoulder pain       Hip pain, chronic, left          No orders of the defined types were placed in this encounter.   Follow-up: No follow-ups on file.    Hermina Staggers, RN

## 2020-04-25 ENCOUNTER — Emergency Department (HOSPITAL_COMMUNITY)
Admission: EM | Admit: 2020-04-25 | Discharge: 2020-04-25 | Disposition: A | Discharge status: Home / Self Care | Payer: Medicare Other | Attending: Emergency Medicine | Admitting: Emergency Medicine

## 2020-04-25 ENCOUNTER — Encounter (HOSPITAL_COMMUNITY): Payer: Self-pay | Admitting: *Deleted

## 2020-04-25 ENCOUNTER — Other Ambulatory Visit: Payer: Self-pay

## 2020-04-25 ENCOUNTER — Ambulatory Visit (HOSPITAL_COMMUNITY)
Admission: EM | Admit: 2020-04-25 | Discharge: 2020-04-25 | Disposition: A | Discharge status: Home / Self Care | Payer: Medicare Other | Attending: Internal Medicine | Admitting: Internal Medicine

## 2020-04-25 ENCOUNTER — Emergency Department (HOSPITAL_COMMUNITY): Payer: Medicare Other

## 2020-04-25 DIAGNOSIS — R519 Headache, unspecified: Secondary | ICD-10-CM | POA: Insufficient documentation

## 2020-04-25 DIAGNOSIS — K219 Gastro-esophageal reflux disease without esophagitis: Secondary | ICD-10-CM | POA: Insufficient documentation

## 2020-04-25 DIAGNOSIS — I10 Essential (primary) hypertension: Secondary | ICD-10-CM | POA: Insufficient documentation

## 2020-04-25 DIAGNOSIS — Z7982 Long term (current) use of aspirin: Secondary | ICD-10-CM | POA: Diagnosis not present

## 2020-04-25 DIAGNOSIS — R079 Chest pain, unspecified: Secondary | ICD-10-CM

## 2020-04-25 DIAGNOSIS — R6889 Other general symptoms and signs: Secondary | ICD-10-CM | POA: Diagnosis not present

## 2020-04-25 DIAGNOSIS — M25512 Pain in left shoulder: Secondary | ICD-10-CM

## 2020-04-25 DIAGNOSIS — Z794 Long term (current) use of insulin: Secondary | ICD-10-CM | POA: Diagnosis not present

## 2020-04-25 DIAGNOSIS — R0602 Shortness of breath: Secondary | ICD-10-CM | POA: Diagnosis not present

## 2020-04-25 DIAGNOSIS — R002 Palpitations: Secondary | ICD-10-CM | POA: Insufficient documentation

## 2020-04-25 DIAGNOSIS — H579 Unspecified disorder of eye and adnexa: Secondary | ICD-10-CM | POA: Diagnosis not present

## 2020-04-25 DIAGNOSIS — R0789 Other chest pain: Secondary | ICD-10-CM | POA: Insufficient documentation

## 2020-04-25 DIAGNOSIS — R739 Hyperglycemia, unspecified: Secondary | ICD-10-CM

## 2020-04-25 DIAGNOSIS — G6289 Other specified polyneuropathies: Secondary | ICD-10-CM | POA: Diagnosis not present

## 2020-04-25 DIAGNOSIS — Z7984 Long term (current) use of oral hypoglycemic drugs: Secondary | ICD-10-CM | POA: Insufficient documentation

## 2020-04-25 DIAGNOSIS — E876 Hypokalemia: Secondary | ICD-10-CM | POA: Insufficient documentation

## 2020-04-25 DIAGNOSIS — Z8616 Personal history of COVID-19: Secondary | ICD-10-CM | POA: Insufficient documentation

## 2020-04-25 DIAGNOSIS — E119 Type 2 diabetes mellitus without complications: Secondary | ICD-10-CM | POA: Insufficient documentation

## 2020-04-25 DIAGNOSIS — Z743 Need for continuous supervision: Secondary | ICD-10-CM | POA: Diagnosis not present

## 2020-04-25 DIAGNOSIS — E1165 Type 2 diabetes mellitus with hyperglycemia: Secondary | ICD-10-CM | POA: Diagnosis not present

## 2020-04-25 LAB — BASIC METABOLIC PANEL
Anion gap: 11 (ref 5–15)
BUN: 23 mg/dL (ref 8–23)
CO2: 26 mmol/L (ref 22–32)
Calcium: 8.9 mg/dL (ref 8.9–10.3)
Chloride: 96 mmol/L — ABNORMAL LOW (ref 98–111)
Creatinine, Ser: 1.29 mg/dL — ABNORMAL HIGH (ref 0.44–1.00)
GFR, Estimated: 44 mL/min — ABNORMAL LOW (ref >60)
Glucose, Bld: 346 mg/dL — ABNORMAL HIGH (ref 70–99)
Potassium: 3.3 mmol/L — ABNORMAL LOW (ref 3.5–5.1)
Sodium: 133 mmol/L — ABNORMAL LOW (ref 135–145)

## 2020-04-25 LAB — CBC
HCT: 35.7 % — ABNORMAL LOW (ref 36.0–46.0)
Hemoglobin: 11.8 g/dL — ABNORMAL LOW (ref 12.0–15.0)
MCH: 30.9 pg (ref 26.0–34.0)
MCHC: 33.1 g/dL (ref 30.0–36.0)
MCV: 93.5 fL (ref 80.0–100.0)
Platelets: 287 10*3/uL (ref 150–400)
RBC: 3.82 MIL/uL — ABNORMAL LOW (ref 3.87–5.11)
RDW: 12.8 % (ref 11.5–15.5)
WBC: 6.2 10*3/uL (ref 4.0–10.5)
nRBC: 0 % (ref 0.0–0.2)

## 2020-04-25 LAB — TROPONIN I (HIGH SENSITIVITY): Troponin I (High Sensitivity): 8 ng/L (ref <18)

## 2020-04-25 MED ORDER — POTASSIUM CHLORIDE ER 10 MEQ PO TBCR: 10.0000 meq | EXTENDED_RELEASE_TABLET | Freq: Every day | ORAL | 0 refills | Status: DC

## 2020-04-25 MED ORDER — DICLOFENAC SODIUM 1 % EX GEL: 4.0000 g | Freq: Four times a day (QID) | CUTANEOUS | 0 refills | Status: AC

## 2020-04-25 MED ORDER — ONETOUCH VERIO W/DEVICE KIT: PACK | 0 refills | Status: DC

## 2020-04-25 MED ORDER — ONETOUCH VERIO VI STRP: ORAL_STRIP | 12 refills | Status: DC

## 2020-04-25 MED ORDER — DICLOFENAC SODIUM 1 % EX GEL
4.0000 g | Freq: Four times a day (QID) | CUTANEOUS | Status: DC | PRN
  Administered 2020-04-25: 4 g via TOPICAL
  Filled 2020-04-25: qty 100

## 2020-04-25 MED ORDER — ONETOUCH ULTRASOFT LANCETS MISC: 12 refills | Status: DC

## 2020-04-25 NOTE — ED Triage Notes (Signed)
Patient is being discharged from the Urgent Care and sent to the Emergency Department via EMS . Per Wendee Beavers, NP, patient is in need of higher level of care due to 10/10 CP, sob, abnormal EKG. Patient is aware and verbalizes understanding of plan of care.  Vitals:   04/25/20 1108  BP: (!) 184/59  Pulse: 89  Resp: (!) 21  Temp: 97.9 F (36.6 C)  SpO2: 99%

## 2020-04-25 NOTE — ED Triage Notes (Signed)
Patient presents to ED via [facility] states she went to [facility] this am for c/o chest pain and headache onset 4 days ago. States she has also had blurred vision fo 4 days. Patient was given 342 mg ASA PTA, states there is no change in her headache just isn't going away.

## 2020-04-25 NOTE — Discharge Instructions (Addendum)
If you are currently taking potassium please take 1 extra dose of potassium daily for 5 days.  If you are not taking potassium please fill the medication that I prescribed to your pharmacy.  You may use the Voltaren (diclofenac) gel, this is a topical pain medication that you can put on the areas of your body that hurt, this may be applied every 6 hours as needed.  You should follow-up with your family doctor within 2 or 3 days if no better or return to the emergency department for worsening symptoms.

## 2020-04-25 NOTE — ED Triage Notes (Signed)
Pt in with c/o generalized cp that started yesterday.  Pt states she was resting when the pain started and she thought it would get better but it didn't.  Pt states she is having SOB, dizziness, and nausea  Wendee Beavers into triage to assess pt Provider recommending pt be seen in ED for CP

## 2020-04-25 NOTE — ED Notes (Signed)
Reviewed discharge instructions with patient. Follow-up care and medications reviewed. Patient verbalized understanding. Patient A&Ox4, VSS upon discharge. 

## 2020-04-25 NOTE — ED Notes (Signed)
Called lab to find out why troponin hasn't been ran yet. Lab is adding on the troponin that was due at 1244.

## 2020-04-25 NOTE — Care Management (Signed)
Patient was seen in the ED last week and was assisted by [facility] with establishing care at the [facility], last PCP appointment on 4/1 with Thad Ranger NP.    Michel Bickers RN, BSN  ED Care Manager [phone]

## 2020-04-25 NOTE — ED Provider Notes (Signed)
4:30 PM-transition of care from Dr. Hyacinth Meeker to evaluate patient post treatment and recheck troponin level.  8:50 PM-troponin delta measurement, returned negative.  Vital signs at 2045 are normal.  At this time the patient is alert and cooperative.  She states that the Voltaren gel that was applied to her shoulder helps. She states that she injured her left shoulder in the fall, but cannot remember when.  She has no further complaints.  Findings discussed with the patient all questions were answered.  Evaluation tonight is reassuring.  No indication for further care and treatment.  She states she plans on going to stay with her daughter, or her sister after she leaves here.  She is discharged with a tubal Voltaren gel to use as needed.   Mancel Bale, MD 04/25/20 2101

## 2020-04-25 NOTE — ED Provider Notes (Signed)
[place] EMERGENCY DEPARTMENT Provider Note   CSN: [id number] Arrival date & time: 04/25/20  1206     History Chief Complaint  Patient presents with  . Chest Pain    [name] is a 75 y.o. female.  HPI   This patient is a 75 year old female, she has a prior history of diabetes, acid reflux, hypertension and a prior stroke that affected her left side.  She is also had some cardiac ischemia in the past, she has a history of a prolonged QT interval, she has a history of depression and states that she lives by herself.  She reports that after her stroke she developed left-sided pain which has been rather chronic and for which her family doctors have prescribed a Lidoderm patch, unfortunately in talking to this patient she is unable to give me a very clear history about why she is here, looking back through the medical record I see multiple visits for complaints of pain, she was here on 30 March as well as 28 March for diffuse pain, she was here on 22 March for arthralgias, she has had neck pain back in November of last year and multiple visits for chest pain as well.  Going back further in the medical history it is clear that the patient was admitted to the hospital in October 2021 and had cervical 3 4, 4 5, 5 6 and 6 7 anterior decompression discectomy and fusion.  She reports that since that time she has had paresthesias of the arms and the legs and is very frustrated with the symptoms.  After talking with the patient for approximately 15 minutes ultimately the patient spends most of her time on discussion of why her arms and legs are always burning and very little time on feeling palpitations or headache which occupied about 15 seconds of the entire discussion.  She denies fevers, vomiting, diarrhea or dysuria.  She has been able to eat and drink, she reports not seeing a doctor for this yet however the medical records is otherwise.    Past Medical History:  Diagnosis  Date  . Diabetes mellitus without complication ([place])   . GERD 09/04/2006   Qualifier: Diagnosis of  By: Suzie Portela    . History of echocardiogram    a. 2D ECHO: 11/06/2013 EF 65%; no WMA. Mild TR. PA pk pressure 43 mm HG  . Hypertension   . Stroke [facility])     Patient Active Problem List   Diagnosis Date Noted  . Cervical stenosis of spine 11/10/2019  . Acute respiratory disease due to COVID-19 virus 05/23/2018  . NSTEMI (non-ST elevated myocardial infarction) ([place]) 11/05/2013  . Spinal stenosis of cervical region 10/20/2012  . Prolonged QT interval 10/18/2012  . Left sided numbness 10/18/2012  . Palpitations 10/18/2012  . 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 Numbness 06/20/2012  . Diabetes mellitus ([place]) 06/20/2012  . CVA (cerebral infarction) 06/20/2012  . Hypokalemia 06/20/2012  . ARTHRALGIA 01/28/2009  . CHEST PAIN UNSPECIFIED 11/26/2008  . DEPRESSION 09/04/2006  . Essential hypertension 09/04/2006  . GERD 09/04/2006  . MULTIPLE SITES, ARTHRITIS-ACUTE/CHRONIC 09/04/2006  . LOW BACK PAIN, CHRONIC 09/04/2006    Past Surgical History:  Procedure Laterality Date  . ABDOMINAL HYSTERECTOMY    . ANTERIOR CERVICAL DECOMPRESSION/DISCECTOMY FUSION 4 LEVELS N/A 11/11/2019   Procedure: Cervical three-four Cervical four-five Cervical five-six Cervical six-seven  Anterior cervical decompression/discectomy/fusion;  Surgeon: Erline Levine, MD;  Location: [place];  Service: Neurosurgery;  Laterality: N/A;  .  LEFT HEART CATHETERIZATION WITH CORONARY ANGIOGRAM N/A 11/05/2013   Procedure: LEFT HEART CATHETERIZATION WITH CORONARY ANGIOGRAM;  Surgeon: Troy Sine, MD;  Location: [facility] CATH LAB;  Service: Cardiovascular;  Laterality: N/A;     OB History   No obstetric history on file.     No family history on file.  Social History   Tobacco Use  . Smoking status: Never Smoker  . Smokeless tobacco: Never Used  Substance Use Topics  . Alcohol use: No  . Drug use: No     Home Medications Prior to Admission medications   Medication Sig Start Date End Date Taking? Authorizing Provider  diclofenac Sodium (VOLTAREN) 1 % GEL Apply 4 g topically 4 (four) times daily for 7 days. 04/25/20 05/02/20 Yes Noemi Chapel, MD  potassium chloride (KLOR-CON) 10 MEQ tablet Take 1 tablet (10 mEq total) by mouth daily. 04/25/20  Yes Noemi Chapel, MD  albuterol (VENTOLIN HFA) 108 (90 Base) MCG/ACT inhaler Inhale 1-2 puffs into the lungs every 6 (six) hours as needed for wheezing or shortness of breath. 04/22/20   Vevelyn Francois, NP  aspirin EC 81 MG tablet Take 81 mg by mouth daily. Swallow whole.    [provider]  Blood Glucose Monitoring Suppl [facility] VERIO) w/Device KIT Use 1-4 times daily as needed/directed DX E11.9 04/25/20   Vevelyn Francois, NP  chlorthalidone (HYGROTON) 25 MG tablet Take 1 tablet (25 mg total) by mouth daily. 04/22/20   Vevelyn Francois, NP  cloNIDine (CATAPRES) 0.2 MG tablet Take 1 tablet (0.2 mg total) by mouth 2 (two) times daily. 12/10/19   Jaynee Eagles, PA-C  clopidogrel (PLAVIX) 75 MG tablet Take 1 tablet (75 mg total) by mouth daily. 04/22/20   Vevelyn Francois, NP  gabapentin (NEURONTIN) 100 MG capsule Take 1 capsule (100 mg total) by mouth 3 (three) times daily. 04/18/20 06/17/20  Margarita Mail, PA-C  glipiZIDE (GLUCOTROL) 5 MG tablet Take 1 tablet (5 mg total) by mouth 2 (two) times daily before a meal. 04/22/20 04/22/21  Vevelyn Francois, NP  hydrALAZINE (APRESOLINE) 50 MG tablet Take 1 tablet (50 mg total) by mouth 2 (two) times daily. 04/22/20   Vevelyn Francois, NP  Lancets [facility] ULTRASOFT) lancets Use 1-4 times daily as needed/directed  DX E11.9 04/25/20   Vevelyn Francois, NP  lidocaine (LIDODERM) 5 % Place 1 patch onto the skin daily. Remove & Discard patch within 12 hours or as directed by MD 04/22/20 04/22/21  Vevelyn Francois, NP  metFORMIN (GLUCOPHAGE) 500 MG tablet Take 1.5 tablets (750 mg total) by mouth 2 (two) times daily with a meal. 10/27/19  11/26/19  Veryl Speak, MD  methocarbamol (ROBAXIN) 500 MG tablet Take 1 tablet (500 mg total) by mouth 4 (four) times daily. 04/22/20   Vevelyn Francois, NP  [facility] VERIO test strip Use 1-4 times daily as directed/needed   DX E11.9 04/25/20   Vevelyn Francois, NP  potassium chloride (MICRO-K) 10 MEQ CR capsule Take 10 mEq by mouth daily. 03/16/19   [provider]  rosuvastatin (CRESTOR) 10 MG tablet Take 1 tablet (10 mg total) by mouth at bedtime. 04/22/20 04/22/21  Vevelyn Francois, NP  MYRBETRIQ 25 MG TB24 tablet Take 25 mg by mouth daily. 02/03/18 01/05/20  [provider]    Allergies    Codeine, Ibuprofen, and Imdur [isosorbide nitrate]  Review of Systems   Review of Systems  All other systems reviewed and are negative.   Physical  Exam Updated Vital Signs BP (!) 112/52   Pulse 72   Temp (!) 97 F (36.1 C) (Oral)   Resp 20   Ht 1.626 m ($Remove'5\' 4"'[id number]$ )   Wt 68.9 kg   SpO2 99%   BMI 26.09 kg/m   Physical Exam Vitals and nursing note reviewed.  Constitutional:      General: She is not in acute distress.    Appearance: She is well-developed.  HENT:     Head: Normocephalic and atraumatic.     Mouth/Throat:     Pharynx: No oropharyngeal exudate.  Eyes:     General: No scleral icterus.       Right eye: No discharge.        Left eye: No discharge.     Conjunctiva/sclera: Conjunctivae normal.     Pupils: Pupils are equal, round, and reactive to light.  Neck:     Thyroid: No thyromegaly.     Vascular: No JVD.  Cardiovascular:     Rate and Rhythm: Normal rate and regular rhythm.     Heart sounds: Normal heart sounds. No murmur heard. No friction rub. No gallop.   Pulmonary:     Effort: Pulmonary effort is normal. No respiratory distress.     Breath sounds: Normal breath sounds. No wheezing or rales.  Abdominal:     General: Bowel sounds are normal. There is no distension.     Palpations: Abdomen is soft. There is no mass.     Tenderness: There is no abdominal  tenderness.  Musculoskeletal:        General: No tenderness. Normal range of motion.     Cervical back: Normal range of motion and neck supple.     Comments: Arms and legs were evaluated, the patient has some tenderness to light touch of the arms and the legs diffusely, she is able to move all 4 extremities with normal strength, she has normal grips, normal finger-nose-finger, no pronator drift, clear speech, no facial droop  Lymphadenopathy:     Cervical: No cervical adenopathy.  Skin:    General: Skin is warm and dry.     Findings: No erythema or rash.  Neurological:     Mental Status: She is alert.     Coordination: Coordination normal.  Psychiatric:        Behavior: Behavior normal.     ED Results / Procedures / Treatments   Labs (all labs ordered are listed, but only abnormal results are displayed) Labs Reviewed  BASIC METABOLIC PANEL - Abnormal; Notable for the following components:      Result Value   Sodium 133 (*)    Potassium 3.3 (*)    Chloride 96 (*)    Glucose, Bld 346 (*)    Creatinine, Ser 1.29 (*)    GFR, Estimated 44 (*)    All other components within normal limits  CBC - Abnormal; Notable for the following components:   RBC 3.82 (*)    Hemoglobin 11.8 (*)    HCT 35.7 (*)    All other components within normal limits  TROPONIN I (HIGH SENSITIVITY)    EKG None  Radiology DG Chest Port 1 View  Result Date: 04/25/2020 CLINICAL DATA:  Chest pain and shortness of breath EXAM: PORTABLE CHEST 1 VIEW COMPARISON:  April 12, 2020 FINDINGS: The lungs are clear. Heart size and pulmonary vascularity within normal limits. No adenopathy. No pneumothorax. Postoperative change noted in lower cervical spine. IMPRESSION: Lungs clear.  Cardiac silhouette within normal limits.  Electronically Signed   By: Lowella Grip III M.D.   On: 04/25/2020 13:31    Procedures Procedures   Medications Ordered in ED Medications  diclofenac Sodium (VOLTAREN) 1 % topical gel 4 g (4  g Topical Given 04/25/20 1512)    ED Course  I have reviewed the triage vital signs and the nursing notes.  Pertinent labs & imaging results that were available during my care of the patient were reviewed by me and considered in my medical decision making (see chart for details).    MDM Rules/Calculators/A&P                          The patient's EKG was performed at 11:14 AM, she has nonspecific T wave findings, compared with EKGs in the past the patient today has an unchanged EKG compared with March 28.  On March 30 she had what appeared to be a left bundle branch block, she does not have a left bundle branch block today.  We will check a metabolic panel, to make sure she is not hypokalemic, recommended some topical Voltaren gel to help with some of her pain as well, she is agreeable.  She is however concerned because she cannot reach her left side where she has some of the pain behind her flank.  There is no signs of a rash in this area to suggest that there is underlying zoster.  Labs reviewed, metabolic panel is unremarkable except for mild hypokalemia, she will be given some potassium supplements for the next 5 days though the medical record does show that she is already taking it.  She does not know.  I will prescribe 5 extra days, she can follow-up for recheck.  It is minimally depressed.  The patient is chronically hyperglycemic, she is not in DKA, she understands her need to take her medications and to follow-up closely.  I am not surprised that she has a chronic neuropathy given her chronic hyperglycemia.  Again this is something that can be managed as an outpatient.  Trop negative  Final Clinical Impression(s) / ED Diagnoses Final diagnoses:  Other polyneuropathy  Hypokalemia  Hyperglycemia    Rx / DC Orders ED Discharge Orders         Ordered    potassium chloride (KLOR-CON) 10 MEQ tablet  Daily        04/25/20 1612    diclofenac Sodium (VOLTAREN) 1 % GEL  4 times daily         04/25/20 1612           Noemi Chapel, MD 04/29/20 1119

## 2020-05-03 DIAGNOSIS — E1165 Type 2 diabetes mellitus with hyperglycemia: Secondary | ICD-10-CM | POA: Diagnosis not present

## 2020-05-03 DIAGNOSIS — I10 Essential (primary) hypertension: Secondary | ICD-10-CM | POA: Diagnosis not present

## 2020-05-03 DIAGNOSIS — R5383 Other fatigue: Secondary | ICD-10-CM | POA: Diagnosis not present

## 2020-05-03 DIAGNOSIS — R809 Proteinuria, unspecified: Secondary | ICD-10-CM | POA: Diagnosis not present

## 2020-05-05 ENCOUNTER — Encounter (HOSPITAL_COMMUNITY): Payer: Self-pay

## 2020-05-05 ENCOUNTER — Emergency Department (HOSPITAL_COMMUNITY)
Admission: EM | Admit: 2020-05-05 | Discharge: 2020-05-05 | Disposition: A | Discharge status: Home / Self Care | Payer: Medicare Other | Attending: Emergency Medicine | Admitting: Emergency Medicine

## 2020-05-05 DIAGNOSIS — M79605 Pain in left leg: Secondary | ICD-10-CM | POA: Diagnosis not present

## 2020-05-05 DIAGNOSIS — Z7982 Long term (current) use of aspirin: Secondary | ICD-10-CM | POA: Diagnosis not present

## 2020-05-05 DIAGNOSIS — M791 Myalgia, unspecified site: Secondary | ICD-10-CM | POA: Insufficient documentation

## 2020-05-05 DIAGNOSIS — Z8616 Personal history of COVID-19: Secondary | ICD-10-CM | POA: Diagnosis not present

## 2020-05-05 DIAGNOSIS — R519 Headache, unspecified: Secondary | ICD-10-CM

## 2020-05-05 DIAGNOSIS — Z7984 Long term (current) use of oral hypoglycemic drugs: Secondary | ICD-10-CM | POA: Insufficient documentation

## 2020-05-05 DIAGNOSIS — R202 Paresthesia of skin: Secondary | ICD-10-CM | POA: Insufficient documentation

## 2020-05-05 DIAGNOSIS — R200 Anesthesia of skin: Secondary | ICD-10-CM | POA: Diagnosis not present

## 2020-05-05 DIAGNOSIS — Z7902 Long term (current) use of antithrombotics/antiplatelets: Secondary | ICD-10-CM | POA: Insufficient documentation

## 2020-05-05 DIAGNOSIS — Z79899 Other long term (current) drug therapy: Secondary | ICD-10-CM | POA: Diagnosis not present

## 2020-05-05 DIAGNOSIS — E119 Type 2 diabetes mellitus without complications: Secondary | ICD-10-CM | POA: Insufficient documentation

## 2020-05-05 DIAGNOSIS — I10 Essential (primary) hypertension: Secondary | ICD-10-CM | POA: Diagnosis not present

## 2020-05-05 DIAGNOSIS — M79604 Pain in right leg: Secondary | ICD-10-CM | POA: Diagnosis not present

## 2020-05-05 LAB — CBC WITH DIFFERENTIAL/PLATELET
Abs Immature Granulocytes: 0.01 10*3/uL (ref 0.00–0.07)
Basophils Absolute: 0 10*3/uL (ref 0.0–0.1)
Basophils Relative: 1 %
Eosinophils Absolute: 0.1 10*3/uL (ref 0.0–0.5)
Eosinophils Relative: 2 %
HCT: 34.1 % — ABNORMAL LOW (ref 36.0–46.0)
Hemoglobin: 11.3 g/dL — ABNORMAL LOW (ref 12.0–15.0)
Immature Granulocytes: 0 %
Lymphocytes Relative: 21 %
Lymphs Abs: 1.3 10*3/uL (ref 0.7–4.0)
MCH: 30.8 pg (ref 26.0–34.0)
MCHC: 33.1 g/dL (ref 30.0–36.0)
MCV: 92.9 fL (ref 80.0–100.0)
Monocytes Absolute: 0.5 10*3/uL (ref 0.1–1.0)
Monocytes Relative: 9 %
Neutro Abs: 4.1 10*3/uL (ref 1.7–7.7)
Neutrophils Relative %: 67 %
Platelets: 320 10*3/uL (ref 150–400)
RBC: 3.67 MIL/uL — ABNORMAL LOW (ref 3.87–5.11)
RDW: 12.3 % (ref 11.5–15.5)
WBC: 6 10*3/uL (ref 4.0–10.5)
nRBC: 0 % (ref 0.0–0.2)

## 2020-05-05 LAB — COMPREHENSIVE METABOLIC PANEL
ALT: 12 U/L (ref 0–44)
AST: 31 U/L (ref 15–41)
Albumin: 3.5 g/dL (ref 3.5–5.0)
Alkaline Phosphatase: 85 U/L (ref 38–126)
Anion gap: 11 (ref 5–15)
BUN: 16 mg/dL (ref 8–23)
CO2: 25 mmol/L (ref 22–32)
Calcium: 8.4 mg/dL — ABNORMAL LOW (ref 8.9–10.3)
Chloride: 97 mmol/L — ABNORMAL LOW (ref 98–111)
Creatinine, Ser: 1.29 mg/dL — ABNORMAL HIGH (ref 0.44–1.00)
GFR, Estimated: 44 mL/min — ABNORMAL LOW (ref >60)
Glucose, Bld: 203 mg/dL — ABNORMAL HIGH (ref 70–99)
Potassium: 4.1 mmol/L (ref 3.5–5.1)
Sodium: 133 mmol/L — ABNORMAL LOW (ref 135–145)
Total Bilirubin: 0.6 mg/dL (ref 0.3–1.2)
Total Protein: 7.1 g/dL (ref 6.5–8.1)

## 2020-05-05 LAB — CK: Total CK: 194 U/L (ref 38–234)

## 2020-05-05 MED ORDER — DIPHENHYDRAMINE HCL 50 MG/ML IJ SOLN
25.0000 mg | Freq: Once | INTRAMUSCULAR | Status: AC
  Administered 2020-05-05: 25 mg via INTRAVENOUS
  Filled 2020-05-05: qty 1

## 2020-05-05 MED ORDER — SODIUM CHLORIDE 0.9 % IV BOLUS
1000.0000 mL | Freq: Once | INTRAVENOUS | Status: AC
  Administered 2020-05-05: 1000 mL via INTRAVENOUS

## 2020-05-05 MED ORDER — PROCHLORPERAZINE EDISYLATE 10 MG/2ML IJ SOLN
10.0000 mg | Freq: Once | INTRAMUSCULAR | Status: AC
  Administered 2020-05-05: 10 mg via INTRAVENOUS
  Filled 2020-05-05: qty 2

## 2020-05-05 NOTE — ED Triage Notes (Signed)
Pt arrived in a cab c/o generalized body pains mostly in feet and legs.  Throbbing in head and numbness in hands  Pt states this has been ongoing for awhile and comes and goes.   States pain is 10/10 bilaterally in both legs

## 2020-05-05 NOTE — Discharge Instructions (Signed)
Your history and exam today are consistent with acute on chronic neuropathic diffuse pain.  You also had the acute on chronic headache.  We were able to get the headache improved with medications and you are starting to feel better.  Your labs were overall reassuring and we feel you are safe for discharge home we do want you to follow-up with both a primary doctor as well as an outpatient neurologist to discuss what medications will be best for your pain.  Please rest and stay hydrated.  If any symptoms change or worsen, please return to the nearest emergency department.

## 2020-05-05 NOTE — ED Provider Notes (Signed)
[place] EMERGENCY DEPARTMENT Provider Note   CSN: [id number] Arrival date & time: 05/05/20  0827     History No chief complaint on file.   [name] is a 75 y.o. female.  The history is provided by the patient and medical records. No language interpreter was used.  Headache Pain location:  Generalized Quality:  Dull Onset quality:  Gradual Timing:  Constant Progression:  Unchanged Chronicity:  Chronic Similar to prior headaches: yes   Relieved by:  Nothing Worsened by:  Nothing Ineffective treatments:  None tried Associated symptoms: myalgias (diffuse muscle aches), numbness (chronic) and paresthesias (chronic)   Associated symptoms: no abdominal pain, no back pain, no congestion, no cough, no diarrhea, no dizziness, no drainage, no fatigue, no fever, no focal weakness, no nausea, no neck pain, no neck stiffness, no photophobia, no seizures, no sinus pressure, no visual change, no vomiting and no weakness        Past Medical History:  Diagnosis Date  . Diabetes mellitus without complication ([place])   . GERD 09/04/2006   Qualifier: Diagnosis of  By: Suzie Portela    . History of echocardiogram    a. 2D ECHO: 11/06/2013 EF 65%; no WMA. Mild TR. PA pk pressure 43 mm HG  . Hypertension   . Stroke [facility])     Patient Active Problem List   Diagnosis Date Noted  . Cervical stenosis of spine 11/10/2019  . Acute respiratory disease due to COVID-19 virus 05/23/2018  . NSTEMI (non-ST elevated myocardial infarction) ([place]) 11/05/2013  . Spinal stenosis of cervical region 10/20/2012  . Prolonged QT interval 10/18/2012  . Left sided numbness 10/18/2012  . Palpitations 10/18/2012  . 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 Numbness 06/20/2012  . Diabetes mellitus ([place]) 06/20/2012  . CVA (cerebral infarction) 06/20/2012  . Hypokalemia 06/20/2012  . ARTHRALGIA 01/28/2009  . CHEST PAIN UNSPECIFIED 11/26/2008  . DEPRESSION 09/04/2006  . Essential  hypertension 09/04/2006  . GERD 09/04/2006  . MULTIPLE SITES, ARTHRITIS-ACUTE/CHRONIC 09/04/2006  . LOW BACK PAIN, CHRONIC 09/04/2006    Past Surgical History:  Procedure Laterality Date  . ABDOMINAL HYSTERECTOMY    . ANTERIOR CERVICAL DECOMPRESSION/DISCECTOMY FUSION 4 LEVELS N/A 11/11/2019   Procedure: Cervical three-four Cervical four-five Cervical five-six Cervical six-seven  Anterior cervical decompression/discectomy/fusion;  Surgeon: Erline Levine, MD;  Location: [place];  Service: Neurosurgery;  Laterality: N/A;  . LEFT HEART CATHETERIZATION WITH CORONARY ANGIOGRAM N/A 11/05/2013   Procedure: LEFT HEART CATHETERIZATION WITH CORONARY ANGIOGRAM;  Surgeon: Troy Sine, MD;  Location: [facility] CATH LAB;  Service: Cardiovascular;  Laterality: N/A;     OB History   No obstetric history on file.     No family history on file.  Social History   Tobacco Use  . Smoking status: Never Smoker  . Smokeless tobacco: Never Used  Substance Use Topics  . Alcohol use: No  . Drug use: No    Home Medications Prior to Admission medications   Medication Sig Start Date End Date Taking? Authorizing Provider  albuterol (VENTOLIN HFA) 108 (90 Base) MCG/ACT inhaler Inhale 1-2 puffs into the lungs every 6 (six) hours as needed for wheezing or shortness of breath. 04/22/20   Vevelyn Francois, NP  aspirin EC 81 MG tablet Take 81 mg by mouth daily. Swallow whole.    [provider]  Blood Glucose Monitoring Suppl [facility] VERIO) w/Device KIT Use 1-4 times daily as needed/directed DX E11.9 04/25/20   Vevelyn Francois, NP  chlorthalidone (HYGROTON)  25 MG tablet Take 1 tablet (25 mg total) by mouth daily. 04/22/20   Vevelyn Francois, NP  cloNIDine (CATAPRES) 0.2 MG tablet Take 1 tablet (0.2 mg total) by mouth 2 (two) times daily. 12/10/19   Jaynee Eagles, PA-C  clopidogrel (PLAVIX) 75 MG tablet Take 1 tablet (75 mg total) by mouth daily. 04/22/20   Vevelyn Francois, NP  gabapentin (NEURONTIN) 100 MG capsule Take  1 capsule (100 mg total) by mouth 3 (three) times daily. 04/18/20 06/17/20  Margarita Mail, PA-C  glipiZIDE (GLUCOTROL) 5 MG tablet Take 1 tablet (5 mg total) by mouth 2 (two) times daily before a meal. 04/22/20 04/22/21  Vevelyn Francois, NP  hydrALAZINE (APRESOLINE) 50 MG tablet Take 1 tablet (50 mg total) by mouth 2 (two) times daily. 04/22/20   Vevelyn Francois, NP  Lancets [facility] ULTRASOFT) lancets Use 1-4 times daily as needed/directed  DX E11.9 04/25/20   Vevelyn Francois, NP  lidocaine (LIDODERM) 5 % Place 1 patch onto the skin daily. Remove & Discard patch within 12 hours or as directed by MD 04/22/20 04/22/21  Vevelyn Francois, NP  metFORMIN (GLUCOPHAGE) 500 MG tablet Take 1.5 tablets (750 mg total) by mouth 2 (two) times daily with a meal. 10/27/19 11/26/19  Veryl Speak, MD  methocarbamol (ROBAXIN) 500 MG tablet Take 1 tablet (500 mg total) by mouth 4 (four) times daily. 04/22/20   Vevelyn Francois, NP  [facility] VERIO test strip Use 1-4 times daily as directed/needed   DX E11.9 04/25/20   Vevelyn Francois, NP  potassium chloride (KLOR-CON) 10 MEQ tablet Take 1 tablet (10 mEq total) by mouth daily. 04/25/20   Noemi Chapel, MD  potassium chloride (MICRO-K) 10 MEQ CR capsule Take 10 mEq by mouth daily. 03/16/19   [provider]  rosuvastatin (CRESTOR) 10 MG tablet Take 1 tablet (10 mg total) by mouth at bedtime. 04/22/20 04/22/21  Vevelyn Francois, NP  MYRBETRIQ 25 MG TB24 tablet Take 25 mg by mouth daily. 02/03/18 01/05/20  [provider]    Allergies    Codeine, Ibuprofen, and Imdur [isosorbide nitrate]  Review of Systems   Review of Systems  Constitutional: Negative for chills, diaphoresis, fatigue and fever.  HENT: Negative for congestion, postnasal drip and sinus pressure.   Eyes: Negative for photophobia.  Respiratory: Negative for cough, chest tightness and shortness of breath.   Gastrointestinal: Negative for abdominal pain, constipation, diarrhea, nausea and vomiting.  Genitourinary:  Negative for dysuria and flank pain.  Musculoskeletal: Positive for myalgias (diffuse muscle aches). Negative for back pain, neck pain and neck stiffness.  Skin: Negative for rash and wound.  Neurological: Positive for numbness (chronic), headaches and paresthesias (chronic). Negative for dizziness, focal weakness, seizures, weakness and light-headedness.  Psychiatric/Behavioral: Negative for agitation and confusion.  All other systems reviewed and are negative.   Physical Exam Updated Vital Signs BP (!) 148/60 (BP Location: Right Arm)   Pulse 73   Temp 98.4 F (36.9 C) (Oral)   Resp 18   Ht $R'5\' 5"'iX$  (1.651 m)   Wt 68 kg   SpO2 99%   BMI 24.96 kg/m   Physical Exam Vitals and nursing note reviewed.  Constitutional:      General: She is not in acute distress.    Appearance: She is well-developed. She is not ill-appearing, toxic-appearing or diaphoretic.  HENT:     Head: Normocephalic and atraumatic.     Mouth/Throat:     Mouth: Mucous membranes are  moist.     Pharynx: No oropharyngeal exudate or posterior oropharyngeal erythema.  Eyes:     Extraocular Movements: Extraocular movements intact.     Conjunctiva/sclera: Conjunctivae normal.     Pupils: Pupils are equal, round, and reactive to light.  Cardiovascular:     Rate and Rhythm: Normal rate and regular rhythm.     Heart sounds: No murmur heard.   Pulmonary:     Effort: Pulmonary effort is normal. No respiratory distress.     Breath sounds: Normal breath sounds. No wheezing, rhonchi or rales.  Chest:     Chest wall: No tenderness.  Abdominal:     General: Abdomen is flat.     Palpations: Abdomen is soft.     Tenderness: There is no abdominal tenderness. There is no right CVA tenderness, left CVA tenderness, guarding or rebound.  Musculoskeletal:        General: Tenderness present.     Cervical back: Neck supple. No tenderness.  Skin:    General: Skin is warm and dry.     Capillary Refill: Capillary refill takes less  than 2 seconds.     Findings: No erythema or rash.  Neurological:     General: No focal deficit present.     Mental Status: She is alert and oriented to person, place, and time. Mental status is at baseline.     Sensory: No sensory deficit.     Motor: No weakness.  Psychiatric:        Mood and Affect: Mood normal.     ED Results / Procedures / Treatments   Labs (all labs ordered are listed, but only abnormal results are displayed) Labs Reviewed  CBC WITH DIFFERENTIAL/PLATELET - Abnormal; Notable for the following components:      Result Value   RBC 3.67 (*)    Hemoglobin 11.3 (*)    HCT 34.1 (*)    All other components within normal limits  COMPREHENSIVE METABOLIC PANEL - Abnormal; Notable for the following components:   Sodium 133 (*)    Chloride 97 (*)    Glucose, Bld 203 (*)    Creatinine, Ser 1.29 (*)    Calcium 8.4 (*)    GFR, Estimated 44 (*)    All other components within normal limits  CK    EKG EKG Interpretation  Date/Time:  Thursday May 05 2020 08:52:31 EDT Ventricular Rate:  80 PR Interval:  134 QRS Duration: 77 QT Interval:  385 QTC Calculation: 445 R Axis:   74 Text Interpretation: Sinus rhythm When compared to prior, similar appearance. No STEMI Confirmed by Antony Blackbird [phone]) on 05/05/2020 9:13:09 AM   Radiology No results found.  Procedures Procedures   Medications Ordered in ED Medications  sodium chloride 0.9 % bolus 1,000 mL (1,000 mLs Intravenous Bolus 05/05/20 0927)  prochlorperazine (COMPAZINE) injection 10 mg (10 mg Intravenous Given 05/05/20 0930)  diphenhydrAMINE (BENADRYL) injection 25 mg (25 mg Intravenous Given 05/05/20 8546)    ED Course  I have reviewed the triage vital signs and the nursing notes.  Pertinent labs & imaging results that were available during my care of the patient were reviewed by me and considered in my medical decision making (see chart for details).    MDM Rules/Calculators/A&P                           Alliah Boulanger is a 75 y.o. female with a past medical history significant for diabetes, CAD  with prior NSTEMI, prior stroke with left-sided numbness at baseline, and diffuse chronic pain who presents with pain.  Patient reports that she has been seen multiple times in the last few weeks for similar complaints of pain in extremities.  Her primary pain is in her head as well as both legs and feet.  She reports that there is no new component aside from slightly worsened headache than baseline.  She denies any new neurologic complaints as she has had chronic numbness with her pain.  She reports that she does not want to keep taking all of her medications as it "messes with her".  Chart review and review of the patient's medication that she brought with her shows that she is currently taking Robaxin and gabapentin for pain which does not seem to adequately control her discomfort.  She reports everything is a 10 out of 10 in pain.  She also reports that she feels alone and does not like being alone.  She did not report SI or HI, just loneliness.  On exam, lungs clear chest nontender.  Abdomen nontender.  Patient has some soreness in all extremities but had intact sensation and strength in extremities with normal grip strength bilaterally and leg strength bilaterally.  Normal finger-nose-finger testing bilaterally.  Symmetric smile.  Clear speech.  Pupils symmetric and reactive, extraocular movements.  No neck tenderness and normal exam otherwise.  Clinically I suspect that patient has acute on chronic headache and acute on chronic diffuse pain.  She reports specifically that there is no new abnormalities or symptoms just worsened chronic pain.  We discussed that we will try to give her headache cocktail and help with some of her pain but she will need to see a primary doctor or pain specialist to help determine what medication she will need to be on in the future and ongoing however we will get some screening labs  to make sure they are not new electrolyte troubles or some new abnormality contributing to her muscle soreness. We will check a CK with the muscle pain and will give her some fluids with a headache cocktail.  With her lack of any new neurologic deficits, will hold on any CT imaging at this time.  Anticipate reassessment after work-up to determine disposition.  Patient's laboratory testing appears similar to prior.  CK not elevated, doubt rhabdo.  Patient was given headache cocktail and had significant improvement in her headache.  She reports that she was feeling better.  I do feel the patient has more chronic pain needs and a lot of her pain seems to be neuropathic in nature.  We will place an ambulatory referral to neurology and have her call her PCP for further recommendations on medication management for chronic pain.  Patient agrees with plan of care and appeared well.  She was discharged in good condition with improved symptoms.   Final Clinical Impression(s) / ED Diagnoses Final diagnoses:  Bilateral leg pain  Nonintractable headache, unspecified chronicity pattern, unspecified headache type    Rx / DC Orders ED Discharge Orders         Ordered    Ambulatory referral to Neurology       Comments: An appointment is requested in approximately: 1 week   05/05/20 1414         Clinical Impression: 1. Bilateral leg pain   2. Nonintractable headache, unspecified chronicity pattern, unspecified headache type     Disposition: Discharge  Condition: Good  I have discussed the results, Dx  and Tx plan with the pt(& family if present). He/she/they expressed understanding and agree(s) with the plan. Discharge instructions discussed at great length. Strict return precautions discussed and pt &/or family have verbalized understanding of the instructions. No further questions at time of discharge.    New Prescriptions   No medications on file    Follow Up: Vevelyn Francois, NP [address] [place] [address] [phone]     [facility] [address]     [place] [address] [place] [address] [id number] mc [place]       Shant Hence, Gwenyth Allegra, MD 05/05/20 [phone]

## 2020-05-05 NOTE — ED Notes (Signed)
Pt ambulated well to the bathroom  

## 2020-05-07 ENCOUNTER — Other Ambulatory Visit: Payer: Self-pay | Admitting: Nurse Practitioner

## 2020-05-11 DIAGNOSIS — H524 Presbyopia: Secondary | ICD-10-CM | POA: Diagnosis not present

## 2020-05-11 DIAGNOSIS — E119 Type 2 diabetes mellitus without complications: Secondary | ICD-10-CM | POA: Diagnosis not present

## 2020-05-16 DIAGNOSIS — H5213 Myopia, bilateral: Secondary | ICD-10-CM | POA: Diagnosis not present

## 2020-05-24 ENCOUNTER — Emergency Department (HOSPITAL_COMMUNITY): Payer: Medicare Other

## 2020-05-24 ENCOUNTER — Emergency Department (HOSPITAL_COMMUNITY)
Admission: EM | Admit: 2020-05-24 | Discharge: 2020-05-25 | Disposition: A | Discharge status: Home / Self Care | Payer: Medicare Other | Attending: Emergency Medicine | Admitting: Emergency Medicine

## 2020-05-24 DIAGNOSIS — R109 Unspecified abdominal pain: Secondary | ICD-10-CM | POA: Diagnosis not present

## 2020-05-24 DIAGNOSIS — K219 Gastro-esophageal reflux disease without esophagitis: Secondary | ICD-10-CM | POA: Diagnosis not present

## 2020-05-24 DIAGNOSIS — E119 Type 2 diabetes mellitus without complications: Secondary | ICD-10-CM | POA: Diagnosis not present

## 2020-05-24 DIAGNOSIS — M549 Dorsalgia, unspecified: Secondary | ICD-10-CM | POA: Insufficient documentation

## 2020-05-24 DIAGNOSIS — R079 Chest pain, unspecified: Secondary | ICD-10-CM | POA: Diagnosis not present

## 2020-05-24 DIAGNOSIS — H538 Other visual disturbances: Secondary | ICD-10-CM | POA: Diagnosis not present

## 2020-05-24 DIAGNOSIS — G8929 Other chronic pain: Secondary | ICD-10-CM | POA: Diagnosis not present

## 2020-05-24 DIAGNOSIS — I10 Essential (primary) hypertension: Secondary | ICD-10-CM | POA: Insufficient documentation

## 2020-05-24 DIAGNOSIS — Z7984 Long term (current) use of oral hypoglycemic drugs: Secondary | ICD-10-CM | POA: Diagnosis not present

## 2020-05-24 DIAGNOSIS — Z8616 Personal history of COVID-19: Secondary | ICD-10-CM | POA: Diagnosis not present

## 2020-05-24 DIAGNOSIS — Z79899 Other long term (current) drug therapy: Secondary | ICD-10-CM | POA: Diagnosis not present

## 2020-05-24 DIAGNOSIS — Z955 Presence of coronary angioplasty implant and graft: Secondary | ICD-10-CM | POA: Diagnosis not present

## 2020-05-24 DIAGNOSIS — Z7902 Long term (current) use of antithrombotics/antiplatelets: Secondary | ICD-10-CM | POA: Insufficient documentation

## 2020-05-24 DIAGNOSIS — Z7982 Long term (current) use of aspirin: Secondary | ICD-10-CM | POA: Diagnosis not present

## 2020-05-24 DIAGNOSIS — R072 Precordial pain: Secondary | ICD-10-CM | POA: Insufficient documentation

## 2020-05-24 LAB — CBC WITH DIFFERENTIAL/PLATELET
Abs Immature Granulocytes: 0.02 10*3/uL (ref 0.00–0.07)
Basophils Absolute: 0 10*3/uL (ref 0.0–0.1)
Basophils Relative: 1 %
Eosinophils Absolute: 0.1 10*3/uL (ref 0.0–0.5)
Eosinophils Relative: 1 %
HCT: 35.7 % — ABNORMAL LOW (ref 36.0–46.0)
Hemoglobin: 11.7 g/dL — ABNORMAL LOW (ref 12.0–15.0)
Immature Granulocytes: 0 %
Lymphocytes Relative: 20 %
Lymphs Abs: 1.5 10*3/uL (ref 0.7–4.0)
MCH: 30.5 pg (ref 26.0–34.0)
MCHC: 32.8 g/dL (ref 30.0–36.0)
MCV: 93 fL (ref 80.0–100.0)
Monocytes Absolute: 0.5 10*3/uL (ref 0.1–1.0)
Monocytes Relative: 7 %
Neutro Abs: 5.3 10*3/uL (ref 1.7–7.7)
Neutrophils Relative %: 71 %
Platelets: 274 10*3/uL (ref 150–400)
RBC: 3.84 MIL/uL — ABNORMAL LOW (ref 3.87–5.11)
RDW: 12.7 % (ref 11.5–15.5)
WBC: 7.4 10*3/uL (ref 4.0–10.5)
nRBC: 0 % (ref 0.0–0.2)

## 2020-05-24 LAB — COMPREHENSIVE METABOLIC PANEL
ALT: 22 U/L (ref 0–44)
AST: 23 U/L (ref 15–41)
Albumin: 3.7 g/dL (ref 3.5–5.0)
Alkaline Phosphatase: 101 U/L (ref 38–126)
Anion gap: 10 (ref 5–15)
BUN: 23 mg/dL (ref 8–23)
CO2: 30 mmol/L (ref 22–32)
Calcium: 9 mg/dL (ref 8.9–10.3)
Chloride: 99 mmol/L (ref 98–111)
Creatinine, Ser: 1.34 mg/dL — ABNORMAL HIGH (ref 0.44–1.00)
GFR, Estimated: 42 mL/min — ABNORMAL LOW (ref >60)
Glucose, Bld: 173 mg/dL — ABNORMAL HIGH (ref 70–99)
Potassium: 3.4 mmol/L — ABNORMAL LOW (ref 3.5–5.1)
Sodium: 139 mmol/L (ref 135–145)
Total Bilirubin: 0.4 mg/dL (ref 0.3–1.2)
Total Protein: 7.9 g/dL (ref 6.5–8.1)

## 2020-05-24 LAB — TROPONIN I (HIGH SENSITIVITY)
Troponin I (High Sensitivity): 5 ng/L (ref <18)
Troponin I (High Sensitivity): 6 ng/L (ref <18)

## 2020-05-24 LAB — LIPASE, BLOOD: Lipase: 34 U/L (ref 11–51)

## 2020-05-24 NOTE — ED Provider Notes (Signed)
Emergency Medicine Provider Triage Evaluation Note  Kajal Scalici , a 75 y.o. female  was evaluated in triage.  Pt complains of chest pain on and off since her stroke.  She reports tingling in her hands  She is unable to tell me clearly why she came in today or what changed (if anything).  Review of Systems  Positive: Chest pain Negative: Shortness of breath  Physical Exam  BP (!) 147/71 (BP Location: Right Arm)   Pulse 77   Temp 98.2 F (36.8 C) (Oral)   Resp 17   SpO2 97%  Gen:   Awake, no distress   Resp:  Normal effort  MSK:   Moves extremities without difficulty  Other:  Normal gait  Medical Decision Making  Medically screening exam initiated at 7:09 PM.  Appropriate orders placed.  Allesandra Huebsch was informed that the remainder of the evaluation will be completed by another provider, this initial triage assessment does not replace that evaluation, and the importance of remaining in the ED until their evaluation is complete.     Cristina Gong, Cordelia Poche 05/24/20 1910    Zadie Rhine, MD 05/25/20 [phone]

## 2020-05-24 NOTE — ED Triage Notes (Signed)
Pt reports constant  chest pain and lower back pain x1 week. Pt reports associated symptoms of weakness and SOB.

## 2020-05-25 ENCOUNTER — Telehealth: Payer: Self-pay | Admitting: Clinical

## 2020-05-25 NOTE — Telephone Encounter (Signed)
[facility] Case Management Referral Note  05/25/2020 Name: [name] MRN: [id number] DOB: 05/06/1945 [name] is a 75 y.o. year old female who sees Barbette Merino, NP for primary care. LCSW was consulted to assess patient's needs and assist the patient with Transportation Needs .  Interpreter: No.   Interpreter Name & Language: none  Assessment: Patient experiencing Transportation barriers. CSW consulted by PCP due to patient's recent frequent ER visits.  Intervention: CSW called patient and confirmed patient would like to attend her next appointment with PCP on 06/03/20. Patient does not have transportation, but is enrolled in the [employer]. CSW scheduled ride for patient to appointment 5/13. CSW available to support with additional resources as needed.   Review of patient status, including review of consultants reports, relevant laboratory and other test results, and collaboration with appropriate care team members and the patient's provider was performed as part of comprehensive patient evaluation and provision of services.    SDOH (Social Determinants of Health) assessments performed: No   Goals Addressed   None      Follow up Plan: 1. CSW available from clinic as needed  Abigail Butts, LCSW [facility] [phone]

## 2020-05-25 NOTE — ED Provider Notes (Signed)
[facility] EMERGENCY DEPARTMENT Provider Note   CSN: [id number] Arrival date & time: 05/24/20  1824     History Chief Complaint  Patient presents with  . Chest Pain    [name] is a 75 y.o. female.  The history is provided by the patient.  Chest Pain Pain location:  L chest Pain quality: aching   Pain radiates to:  Does not radiate Pain severity:  Mild Onset quality:  Gradual Timing:  Constant Progression:  Unchanged Chronicity:  Chronic Relieved by:  Nothing Worsened by:  Certain positions and movement Associated symptoms: abdominal pain and back pain   Associated symptoms: no fever and no shortness of breath    Patient presents with multiple complaints.  Patient has a history of diabetes, hypertension and previous stroke.  Patient presents with chest and back pain.  She also mentions chronic visual changes.  She reports she has follow-up with ophthalmology next month.  She also reports that her chest pain is chronic in nature.  She also mention she had a previous stroke, but does not had any new weakness in her arms or legs    Past Medical History:  Diagnosis Date  . Diabetes mellitus without complication ([place])   . GERD 09/04/2006   Qualifier: Diagnosis of  By: Suzie Portela    . History of echocardiogram    a. 2D ECHO: 11/06/2013 EF 65%; no WMA. Mild TR. PA pk pressure 43 mm HG  . Hypertension   . Stroke [facility])     Patient Active Problem List   Diagnosis Date Noted  . Cervical stenosis of spine 11/10/2019  . Acute respiratory disease due to COVID-19 virus 05/23/2018  . NSTEMI (non-ST elevated myocardial infarction) ([place]) 11/05/2013  . Spinal stenosis of cervical region 10/20/2012  . Prolonged QT interval 10/18/2012  . Left sided numbness 10/18/2012  . Palpitations 10/18/2012  . 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 Numbness 06/20/2012  . Diabetes mellitus ([place]) 06/20/2012  . CVA (cerebral infarction) 06/20/2012  .  Hypokalemia 06/20/2012  . ARTHRALGIA 01/28/2009  . CHEST PAIN UNSPECIFIED 11/26/2008  . DEPRESSION 09/04/2006  . Essential hypertension 09/04/2006  . GERD 09/04/2006  . MULTIPLE SITES, ARTHRITIS-ACUTE/CHRONIC 09/04/2006  . LOW BACK PAIN, CHRONIC 09/04/2006    Past Surgical History:  Procedure Laterality Date  . ABDOMINAL HYSTERECTOMY    . ANTERIOR CERVICAL DECOMPRESSION/DISCECTOMY FUSION 4 LEVELS N/A 11/11/2019   Procedure: Cervical three-four Cervical four-five Cervical five-six Cervical six-seven  Anterior cervical decompression/discectomy/fusion;  Surgeon: Erline Levine, MD;  Location: [place];  Service: Neurosurgery;  Laterality: N/A;  . LEFT HEART CATHETERIZATION WITH CORONARY ANGIOGRAM N/A 11/05/2013   Procedure: LEFT HEART CATHETERIZATION WITH CORONARY ANGIOGRAM;  Surgeon: Troy Sine, MD;  Location: [facility] CATH LAB;  Service: Cardiovascular;  Laterality: N/A;     OB History   No obstetric history on file.     No family history on file.  Social History   Tobacco Use  . Smoking status: Never Smoker  . Smokeless tobacco: Never Used  Substance Use Topics  . Alcohol use: No  . Drug use: No    Home Medications Prior to Admission medications   Medication Sig Start Date End Date Taking? Authorizing Provider  albuterol (VENTOLIN HFA) 108 (90 Base) MCG/ACT inhaler Inhale 1-2 puffs into the lungs every 6 (six) hours as needed for wheezing or shortness of breath. 04/22/20   Vevelyn Francois, NP  aspirin EC 81 MG tablet Take 81 mg by mouth daily. Swallow  whole.    [provider]  Blood Glucose Monitoring Suppl (ONETOUCH VERIO) w/Device KIT Use 1-4 times daily as needed/directed DX E11.9 04/25/20   Vevelyn Francois, NP  chlorthalidone (HYGROTON) 25 MG tablet Take 1 tablet (25 mg total) by mouth daily. 04/22/20   Vevelyn Francois, NP  cloNIDine (CATAPRES) 0.2 MG tablet Take 1 tablet (0.2 mg total) by mouth 2 (two) times daily. 12/10/19   Jaynee Eagles, PA-C  clopidogrel (PLAVIX) 75  MG tablet Take 1 tablet (75 mg total) by mouth daily. 04/22/20   Vevelyn Francois, NP  gabapentin (NEURONTIN) 100 MG capsule Take 1 capsule (100 mg total) by mouth 3 (three) times daily. 04/18/20 06/17/20  Margarita Mail, PA-C  glipiZIDE (GLUCOTROL) 5 MG tablet Take 1 tablet (5 mg total) by mouth 2 (two) times daily before a meal. 04/22/20 04/22/21  Vevelyn Francois, NP  hydrALAZINE (APRESOLINE) 50 MG tablet Take 1 tablet (50 mg total) by mouth 2 (two) times daily. 04/22/20   Vevelyn Francois, NP  Lancets [facility] ULTRASOFT) lancets Use 1-4 times daily as needed/directed  DX E11.9 04/25/20   Vevelyn Francois, NP  lidocaine (LIDODERM) 5 % Place 1 patch onto the skin daily. Remove & Discard patch within 12 hours or as directed by MD 04/22/20 04/22/21  Vevelyn Francois, NP  metFORMIN (GLUCOPHAGE) 500 MG tablet Take 1.5 tablets (750 mg total) by mouth 2 (two) times daily with a meal. 10/27/19 11/26/19  Veryl Speak, MD  methocarbamol (ROBAXIN) 500 MG tablet TAKE 1 TABLET BY MOUTH 4 TIMES DAILY. 05/09/20   Vevelyn Francois, NP  [facility] VERIO test strip Use 1-4 times daily as directed/needed   DX E11.9 04/25/20   Vevelyn Francois, NP  potassium chloride (KLOR-CON) 10 MEQ tablet Take 1 tablet (10 mEq total) by mouth daily. 04/25/20   Noemi Chapel, MD  potassium chloride (MICRO-K) 10 MEQ CR capsule Take 10 mEq by mouth daily. 03/16/19   [provider]  rosuvastatin (CRESTOR) 10 MG tablet Take 1 tablet (10 mg total) by mouth at bedtime. 04/22/20 04/22/21  Vevelyn Francois, NP  MYRBETRIQ 25 MG TB24 tablet Take 25 mg by mouth daily. 02/03/18 01/05/20  [provider]    Allergies    Codeine, Ibuprofen, and Imdur [isosorbide nitrate]  Review of Systems   Review of Systems  Constitutional: Negative for fever.  Eyes: Positive for visual disturbance.       Chronic visual changes   Respiratory: Negative for shortness of breath.   Cardiovascular: Positive for chest pain.  Gastrointestinal: Positive for abdominal pain.   Musculoskeletal: Positive for back pain.  Neurological: Negative for syncope.  All other systems reviewed and are negative.   Physical Exam Updated Vital Signs BP (!) 159/66 (BP Location: Right Arm)   Pulse 70   Temp 98.2 F (36.8 C) (Oral)   Resp 13   SpO2 99%   Physical Exam CONSTITUTIONAL: Elderly, no acute distress HEAD: Normocephalic/atraumatic EYES: EOMI/PERRL, no conjunctival erythema, no corneal hazing ENMT: Mucous membranes moist NECK: supple no meningeal signs SPINE/BACK:entire spine nontender CV: S1/S2 noted, no murmurs/rubs/gallops noted LUNGS: Lungs are clear to auscultation bilaterally, no apparent distress Chest-left-sided chest wall tenderness ABDOMEN: soft, nontender, no rebound or guarding, bowel sounds noted throughout abdomen GU:no cva tenderness NEURO: Pt is awake/alert/appropriate, moves all extremitiesx4.  No facial droop.  No arm or leg drift EXTREMITIES: pulses normal/equal, full ROM SKIN: warm, color normal PSYCH: no abnormalities of mood noted, alert and oriented  to situation  ED Results / Procedures / Treatments   Labs (all labs ordered are listed, but only abnormal results are displayed) Labs Reviewed  CBC WITH DIFFERENTIAL/PLATELET - Abnormal; Notable for the following components:      Result Value   RBC 3.84 (*)    Hemoglobin 11.7 (*)    HCT 35.7 (*)    All other components within normal limits  COMPREHENSIVE METABOLIC PANEL - Abnormal; Notable for the following components:   Potassium 3.4 (*)    Glucose, Bld 173 (*)    Creatinine, Ser 1.34 (*)    GFR, Estimated 42 (*)    All other components within normal limits  LIPASE, BLOOD  TROPONIN I (HIGH SENSITIVITY)  TROPONIN I (HIGH SENSITIVITY)    EKG EKG Interpretation  Date/Time:  Tuesday May 24 2020 19:01:13 EDT Ventricular Rate:  74 PR Interval:  132 QRS Duration: 70 QT Interval:  378 QTC Calculation: 419 R Axis:   50 Text Interpretation: Normal sinus rhythm Cannot rule out  Anterior infarct , age undetermined Abnormal ECG Confirmed by Ripley Fraise [phone]) on 05/25/2020 12:29:57 AM   Radiology DG Chest 2 View  Result Date: 05/24/2020 CLINICAL DATA:  Chest pain EXAM: CHEST - 2 VIEW COMPARISON:  04/26/1998 22 FINDINGS: Surgical hardware in the cervical spine. No focal opacity or pleural effusion. Normal cardiomediastinal silhouette. No pneumothorax. IMPRESSION: No active cardiopulmonary disease. Electronically Signed   By: Donavan Foil M.D.   On: 05/24/2020 19:48    Procedures Procedures   Medications Ordered in ED Medications - No data to display  ED Course  I have reviewed the triage vital signs and the nursing notes.  Pertinent labs & imaging results that were available during my care of the patient were reviewed by me and considered in my medical decision making (see chart for details).    MDM Rules/Calculators/A&P                          Patient presents for multiple chronic complaints.  When asked patient specifically, she reports that none of her issues are acute tonight.  She has no focal weakness.  She is in no acute distress.  Cardiac evaluation is unremarkable. Patient has had over 10 ER visits in 6 months.  I encouraged close PCP follow-up. Final Clinical Impression(s) / ED Diagnoses Final diagnoses:  Precordial pain    Rx / DC Orders ED Discharge Orders    None       Ripley Fraise, MD 05/25/20 [phone]

## 2020-05-25 NOTE — Discharge Instructions (Addendum)

## 2020-05-28 ENCOUNTER — Other Ambulatory Visit: Payer: Self-pay

## 2020-05-28 ENCOUNTER — Encounter (HOSPITAL_COMMUNITY): Payer: Self-pay

## 2020-05-28 ENCOUNTER — Emergency Department (HOSPITAL_COMMUNITY)
Admission: EM | Admit: 2020-05-28 | Discharge: 2020-05-28 | Disposition: A | Discharge status: Home / Self Care | Payer: Medicare Other | Attending: Emergency Medicine | Admitting: Emergency Medicine

## 2020-05-28 DIAGNOSIS — Z7984 Long term (current) use of oral hypoglycemic drugs: Secondary | ICD-10-CM | POA: Diagnosis not present

## 2020-05-28 DIAGNOSIS — Z79899 Other long term (current) drug therapy: Secondary | ICD-10-CM | POA: Diagnosis not present

## 2020-05-28 DIAGNOSIS — Z7982 Long term (current) use of aspirin: Secondary | ICD-10-CM | POA: Diagnosis not present

## 2020-05-28 DIAGNOSIS — I10 Essential (primary) hypertension: Secondary | ICD-10-CM | POA: Insufficient documentation

## 2020-05-28 DIAGNOSIS — X58XXXA Exposure to other specified factors, initial encounter: Secondary | ICD-10-CM | POA: Diagnosis not present

## 2020-05-28 DIAGNOSIS — E119 Type 2 diabetes mellitus without complications: Secondary | ICD-10-CM | POA: Diagnosis not present

## 2020-05-28 DIAGNOSIS — S025XXB Fracture of tooth (traumatic), initial encounter for open fracture: Secondary | ICD-10-CM

## 2020-05-28 DIAGNOSIS — S025XXA Fracture of tooth (traumatic), initial encounter for closed fracture: Secondary | ICD-10-CM | POA: Diagnosis not present

## 2020-05-28 DIAGNOSIS — S0993XA Unspecified injury of face, initial encounter: Secondary | ICD-10-CM | POA: Diagnosis present

## 2020-05-28 DIAGNOSIS — Z955 Presence of coronary angioplasty implant and graft: Secondary | ICD-10-CM | POA: Insufficient documentation

## 2020-05-28 DIAGNOSIS — K0889 Other specified disorders of teeth and supporting structures: Secondary | ICD-10-CM

## 2020-05-28 MED ORDER — OXYCODONE-ACETAMINOPHEN 5-325 MG PO TABS
1.0000 | ORAL_TABLET | Freq: Once | ORAL | Status: AC
  Administered 2020-05-28: 1 via ORAL
  Filled 2020-05-28: qty 1

## 2020-05-28 MED ORDER — OXYCODONE HCL 5 MG PO TABS: 5.0000 mg | ORAL_TABLET | Freq: Four times a day (QID) | ORAL | 0 refills | Status: DC | PRN

## 2020-05-28 MED ORDER — PENICILLIN V POTASSIUM 250 MG PO TABS
500.0000 mg | ORAL_TABLET | Freq: Once | ORAL | Status: AC
  Administered 2020-05-28: 500 mg via ORAL
  Filled 2020-05-28: qty 2

## 2020-05-28 MED ORDER — ACETAMINOPHEN 325 MG PO TABS: 650.0000 mg | ORAL_TABLET | Freq: Four times a day (QID) | ORAL | 0 refills | Status: DC | PRN

## 2020-05-28 MED ORDER — PENICILLIN V POTASSIUM 500 MG PO TABS: 500.0000 mg | ORAL_TABLET | Freq: Four times a day (QID) | ORAL | 0 refills | Status: AC

## 2020-05-28 MED ORDER — CHLORHEXIDINE GLUCONATE 0.12 % MT SOLN: 15.0000 mL | Freq: Two times a day (BID) | OROMUCOSAL | 0 refills | Status: AC

## 2020-05-28 NOTE — Discharge Instructions (Addendum)
It is very important you see a dentist for your broken tooth.  This could get infected and cause very serious infection.  He should take the antibiotics I prescribed, along with the swish and spit mouthwash.

## 2020-05-28 NOTE — ED Triage Notes (Signed)
Patient reports L sided upper dental pain, denies fever, reports it has been going on for a while but tonight it got worse

## 2020-05-28 NOTE — ED Provider Notes (Signed)
[facility] EMERGENCY DEPARTMENT Provider Note   CSN: [id number] Arrival date & time: 05/28/20  2048     History Chief Complaint  Patient presents with  . Dental Pain    [name] is a 75 y.o. female who is here with a fractured tooth.  She reports that she bit into something hard and broke her left incisor on the upper side yesterday.  The pain is got worse today.  She denies fevers or chills.  She has a PCP appointment on Monday but is unable to get into see them.  She does not currently have a dentist.. HPI     Past Medical History:  Diagnosis Date  . Diabetes mellitus without complication ([place])   . GERD 09/04/2006   Qualifier: Diagnosis of  By: Suzie Portela    . History of echocardiogram    a. 2D ECHO: 11/06/2013 EF 65%; no WMA. Mild TR. PA pk pressure 43 mm HG  . Hypertension   . Stroke [facility])     Patient Active Problem List   Diagnosis Date Noted  . Cervical stenosis of spine 11/10/2019  . Acute respiratory disease due to COVID-19 virus 05/23/2018  . NSTEMI (non-ST elevated myocardial infarction) ([place]) 11/05/2013  . Spinal stenosis of cervical region 10/20/2012  . Prolonged QT interval 10/18/2012  . Left sided numbness 10/18/2012  . Palpitations 10/18/2012  . 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 Numbness 06/20/2012  . Diabetes mellitus ([place]) 06/20/2012  . CVA (cerebral infarction) 06/20/2012  . Hypokalemia 06/20/2012  . ARTHRALGIA 01/28/2009  . CHEST PAIN UNSPECIFIED 11/26/2008  . DEPRESSION 09/04/2006  . Essential hypertension 09/04/2006  . GERD 09/04/2006  . MULTIPLE SITES, ARTHRITIS-ACUTE/CHRONIC 09/04/2006  . LOW BACK PAIN, CHRONIC 09/04/2006    Past Surgical History:  Procedure Laterality Date  . ABDOMINAL HYSTERECTOMY    . ANTERIOR CERVICAL DECOMPRESSION/DISCECTOMY FUSION 4 LEVELS N/A 11/11/2019   Procedure: Cervical three-four Cervical four-five Cervical five-six Cervical six-seven  Anterior cervical  decompression/discectomy/fusion;  Surgeon: Erline Levine, MD;  Location: [facility];  Service: Neurosurgery;  Laterality: N/A;  . LEFT HEART CATHETERIZATION WITH CORONARY ANGIOGRAM N/A 11/05/2013   Procedure: LEFT HEART CATHETERIZATION WITH CORONARY ANGIOGRAM;  Surgeon: Troy Sine, MD;  [facility] CATH LAB;  Service: Cardiovascular;  Laterality: N/A;     OB History   No obstetric history on file.     History reviewed. No pertinent family history.  Social History   Tobacco Use  . Smoking status: Never Smoker  . Smokeless tobacco: Never Used  Substance Use Topics  . Alcohol use: No  . Drug use: No    Home Medications Prior to Admission medications   Medication Sig Start Date End Date Taking? Authorizing Provider  acetaminophen (TYLENOL) 325 MG tablet Take 2 tablets (650 mg total) by mouth every 6 (six) hours as needed for up to 30 doses for mild pain or moderate pain. 05/28/20  Yes Chikita Dogan, Carola Rhine, MD  chlorhexidine (PERIDEX) 0.12 % solution Use as directed 15 mLs in the mouth or throat 2 (two) times daily for 10 days. 05/28/20 06/07/20 Yes Henryetta Corriveau, Carola Rhine, MD  oxyCODONE (ROXICODONE) 5 MG immediate release tablet Take 1 tablet (5 mg total) by mouth every 6 (six) hours as needed for up to 4 doses for severe pain. 05/28/20  Yes Parminder Trapani, Carola Rhine, MD  penicillin v potassium (VEETID) 500 MG tablet Take 1 tablet (500 mg total) by mouth 4 (four) times daily for 7 days. 05/28/20 06/04/20 Yes Jaydeen Darley,  Carola Rhine, MD  albuterol (VENTOLIN HFA) 108 (90 Base) MCG/ACT inhaler Inhale 1-2 puffs into the lungs every 6 (six) hours as needed for wheezing or shortness of breath. 04/22/20   Vevelyn Francois, NP  aspirin EC 81 MG tablet Take 81 mg by mouth daily. Swallow whole.    [provider]  Blood Glucose Monitoring Suppl [facility] VERIO) w/Device KIT Use 1-4 times daily as needed/directed DX E11.9 04/25/20   Vevelyn Francois, NP  chlorthalidone (HYGROTON) 25 MG tablet Take 1 tablet (25 mg total) by mouth  daily. 04/22/20   Vevelyn Francois, NP  cloNIDine (CATAPRES) 0.2 MG tablet Take 1 tablet (0.2 mg total) by mouth 2 (two) times daily. 12/10/19   Jaynee Eagles, PA-C  clopidogrel (PLAVIX) 75 MG tablet Take 1 tablet (75 mg total) by mouth daily. 04/22/20   Vevelyn Francois, NP  gabapentin (NEURONTIN) 100 MG capsule Take 1 capsule (100 mg total) by mouth 3 (three) times daily. 04/18/20 06/17/20  Margarita Mail, PA-C  glipiZIDE (GLUCOTROL) 5 MG tablet Take 1 tablet (5 mg total) by mouth 2 (two) times daily before a meal. 04/22/20 04/22/21  Vevelyn Francois, NP  hydrALAZINE (APRESOLINE) 50 MG tablet Take 1 tablet (50 mg total) by mouth 2 (two) times daily. 04/22/20   Vevelyn Francois, NP  Lancets [facility] ULTRASOFT) lancets Use 1-4 times daily as needed/directed  DX E11.9 04/25/20   Vevelyn Francois, NP  lidocaine (LIDODERM) 5 % Place 1 patch onto the skin daily. Remove & Discard patch within 12 hours or as directed by MD 04/22/20 04/22/21  Vevelyn Francois, NP  metFORMIN (GLUCOPHAGE) 500 MG tablet Take 1.5 tablets (750 mg total) by mouth 2 (two) times daily with a meal. 10/27/19 11/26/19  Veryl Speak, MD  methocarbamol (ROBAXIN) 500 MG tablet TAKE 1 TABLET BY MOUTH 4 TIMES DAILY. 05/09/20   Vevelyn Francois, NP  [facility] VERIO test strip Use 1-4 times daily as directed/needed   DX E11.9 04/25/20   Vevelyn Francois, NP  potassium chloride (KLOR-CON) 10 MEQ tablet Take 1 tablet (10 mEq total) by mouth daily. 04/25/20   Noemi Chapel, MD  potassium chloride (MICRO-K) 10 MEQ CR capsule Take 10 mEq by mouth daily. 03/16/19   [provider]  rosuvastatin (CRESTOR) 10 MG tablet Take 1 tablet (10 mg total) by mouth at bedtime. 04/22/20 04/22/21  Vevelyn Francois, NP  MYRBETRIQ 25 MG TB24 tablet Take 25 mg by mouth daily. 02/03/18 01/05/20  [provider]    Allergies    Codeine, Ibuprofen, and Imdur [isosorbide nitrate]  Review of Systems   Review of Systems  Constitutional: Negative for chills and fever.  HENT: Positive  for dental problem. Negative for ear pain.   Eyes: Negative for pain and visual disturbance.  Respiratory: Negative for cough and shortness of breath.   Cardiovascular: Negative for chest pain and palpitations.  Gastrointestinal: Negative for abdominal pain and vomiting.  Musculoskeletal: Negative for arthralgias and back pain.  Neurological: Negative for seizures and syncope.  All other systems reviewed and are negative.   Physical Exam Updated Vital Signs BP (!) 143/70 (BP Location: Right Arm)   Pulse 74   Temp (!) 97.3 F (36.3 C) (Temporal)   Resp 16   Ht $R'5\' 5"'Cn$  (1.651 m)   Wt 68 kg   SpO2 100%   BMI 24.95 kg/m   Physical Exam Constitutional:      General: She is not in acute distress. HENT:  Head: Normocephalic and atraumatic.     Comments: Left upper incisor ellis 2 fracture, no bleeding, no peridental abscess Eyes:     Conjunctiva/sclera: Conjunctivae normal.     Pupils: Pupils are equal, round, and reactive to light.  Cardiovascular:     Rate and Rhythm: Normal rate and regular rhythm.  Pulmonary:     Effort: Pulmonary effort is normal. No respiratory distress.  Abdominal:     General: There is no distension.     Tenderness: There is no abdominal tenderness.  Skin:    General: Skin is warm and dry.  Neurological:     General: No focal deficit present.     Mental Status: She is alert. Mental status is at baseline.  Psychiatric:        Mood and Affect: Mood normal.        Behavior: Behavior normal.     ED Results / Procedures / Treatments   Labs (all labs ordered are listed, but only abnormal results are displayed) Labs Reviewed - No data to display  EKG None  Radiology No results found.  Procedures Procedures   Medications Ordered in ED Medications  oxyCODONE-acetaminophen (PERCOCET/ROXICET) 5-325 MG per tablet 1 tablet (1 tablet Oral Given 05/28/20 2221)  penicillin v potassium (VEETID) tablet 500 mg (500 mg Oral Given 05/28/20 2221)    ED  Course  I have reviewed the triage vital signs and the nursing notes.  Pertinent labs & imaging results that were available during my care of the patient were reviewed by me and considered in my medical decision making (see chart for details).  Patient is here with broken tooth and Ellis 2 fracture.  We will start on penicillin to prevent an infection, chlorhexidine swish and spit, and I strongly advised dental follow-up.  Resources were provided.  Patient will be discharged.     Final Clinical Impression(s) / ED Diagnoses Final diagnoses:  Pain, dental  Open fracture of tooth, initial encounter    Rx / DC Orders ED Discharge Orders         Ordered    oxyCODONE (ROXICODONE) 5 MG immediate release tablet  Every 6 hours PRN        05/28/20 2241    acetaminophen (TYLENOL) 325 MG tablet  Every 6 hours PRN        05/28/20 2241    penicillin v potassium (VEETID) 500 MG tablet  4 times daily        05/28/20 2241    chlorhexidine (PERIDEX) 0.12 % solution  2 times daily        05/28/20 2241           Wyvonnia Dusky, MD 05/29/20 1045

## 2020-06-02 ENCOUNTER — Other Ambulatory Visit: Payer: Self-pay

## 2020-06-02 ENCOUNTER — Emergency Department (HOSPITAL_COMMUNITY)
Admission: EM | Admit: 2020-06-02 | Discharge: 2020-06-03 | Disposition: A | Discharge status: Home / Self Care | Payer: Medicare Other | Attending: Emergency Medicine | Admitting: Emergency Medicine

## 2020-06-02 ENCOUNTER — Telehealth: Payer: Self-pay | Admitting: Clinical

## 2020-06-02 ENCOUNTER — Encounter (HOSPITAL_COMMUNITY): Payer: Self-pay

## 2020-06-02 DIAGNOSIS — Z79899 Other long term (current) drug therapy: Secondary | ICD-10-CM | POA: Diagnosis not present

## 2020-06-02 DIAGNOSIS — E119 Type 2 diabetes mellitus without complications: Secondary | ICD-10-CM | POA: Insufficient documentation

## 2020-06-02 DIAGNOSIS — R519 Headache, unspecified: Secondary | ICD-10-CM

## 2020-06-02 DIAGNOSIS — I10 Essential (primary) hypertension: Secondary | ICD-10-CM | POA: Insufficient documentation

## 2020-06-02 DIAGNOSIS — Z7982 Long term (current) use of aspirin: Secondary | ICD-10-CM | POA: Insufficient documentation

## 2020-06-02 DIAGNOSIS — Z7984 Long term (current) use of oral hypoglycemic drugs: Secondary | ICD-10-CM | POA: Insufficient documentation

## 2020-06-02 DIAGNOSIS — I158 Other secondary hypertension: Secondary | ICD-10-CM | POA: Insufficient documentation

## 2020-06-02 DIAGNOSIS — Z955 Presence of coronary angioplasty implant and graft: Secondary | ICD-10-CM | POA: Insufficient documentation

## 2020-06-02 DIAGNOSIS — Z8616 Personal history of COVID-19: Secondary | ICD-10-CM | POA: Diagnosis not present

## 2020-06-02 LAB — COMPREHENSIVE METABOLIC PANEL
ALT: 17 U/L (ref 0–44)
AST: 24 U/L (ref 15–41)
Albumin: 3.9 g/dL (ref 3.5–5.0)
Alkaline Phosphatase: 116 U/L (ref 38–126)
Anion gap: 9 (ref 5–15)
BUN: 13 mg/dL (ref 8–23)
CO2: 31 mmol/L (ref 22–32)
Calcium: 9.3 mg/dL (ref 8.9–10.3)
Chloride: 95 mmol/L — ABNORMAL LOW (ref 98–111)
Creatinine, Ser: 1.14 mg/dL — ABNORMAL HIGH (ref 0.44–1.00)
GFR, Estimated: 51 mL/min — ABNORMAL LOW (ref >60)
Glucose, Bld: 216 mg/dL — ABNORMAL HIGH (ref 70–99)
Potassium: 3.3 mmol/L — ABNORMAL LOW (ref 3.5–5.1)
Sodium: 135 mmol/L (ref 135–145)
Total Bilirubin: 0.5 mg/dL (ref 0.3–1.2)
Total Protein: 8.4 g/dL — ABNORMAL HIGH (ref 6.5–8.1)

## 2020-06-02 LAB — CBC WITH DIFFERENTIAL/PLATELET
Abs Immature Granulocytes: 0.03 10*3/uL (ref 0.00–0.07)
Basophils Absolute: 0 10*3/uL (ref 0.0–0.1)
Basophils Relative: 1 %
Eosinophils Absolute: 0 10*3/uL (ref 0.0–0.5)
Eosinophils Relative: 1 %
HCT: 38.1 % (ref 36.0–46.0)
Hemoglobin: 12.7 g/dL (ref 12.0–15.0)
Immature Granulocytes: 0 %
Lymphocytes Relative: 15 %
Lymphs Abs: 1.2 10*3/uL (ref 0.7–4.0)
MCH: 30.6 pg (ref 26.0–34.0)
MCHC: 33.3 g/dL (ref 30.0–36.0)
MCV: 91.8 fL (ref 80.0–100.0)
Monocytes Absolute: 0.4 10*3/uL (ref 0.1–1.0)
Monocytes Relative: 5 %
Neutro Abs: 6.3 10*3/uL (ref 1.7–7.7)
Neutrophils Relative %: 78 %
Platelets: 320 10*3/uL (ref 150–400)
RBC: 4.15 MIL/uL (ref 3.87–5.11)
RDW: 12.3 % (ref 11.5–15.5)
WBC: 8 10*3/uL (ref 4.0–10.5)
nRBC: 0 % (ref 0.0–0.2)

## 2020-06-02 LAB — CK: Total CK: 219 U/L (ref 38–234)

## 2020-06-02 NOTE — ED Notes (Addendum)
Crystal, RN made aware of pts high blood pressure. Pts acuity changed from a 3 to a 2.

## 2020-06-02 NOTE — ED Triage Notes (Signed)
Pt c/o headache, hands hurting, legs and knees hurting. Pt states it feels like her blood pressure is up.  Pt denies chest pain, nausea, or vomiting.

## 2020-06-02 NOTE — ED Provider Notes (Signed)
Emergency Medicine Provider Triage Evaluation Note  [name] , a 75 y.o. female  was evaluated in triage.  Pt complains of body aches that started today. Denies any chest pain or shortness of breath  Review of Systems  Positive: Generalized body aches Negative: Chest pain, sob  Physical Exam  BP (!) 190/81 (BP Location: Left Arm)   Pulse 80   Temp 98.5 F (36.9 C) (Oral)   Resp 20   SpO2 100%  Gen:   Awake, no distress   Resp:  Normal effort  MSK:   Moves extremities without difficulty  Other:  Clear speech  Medical Decision Making  Medically screening exam initiated at 3:44 PM.  Appropriate orders placed.  [name] was informed that the remainder of the evaluation will be completed by another provider, this initial triage assessment does not replace that evaluation, and the importance of remaining in the ED until their evaluation is complete.    Karrie Meres, PA-C 06/02/20 1548    Derwood Kaplan, MD 06/07/20 [phone]

## 2020-06-02 NOTE — Telephone Encounter (Signed)
[facility] General Follow Up Note  06/02/2020 Name: [name] MRN: [id number] DOB: 06-Dec-1945 [name] is a 75 y.o. year old female who sees Adriana Merino, NP for primary care. LCSW was initially consulted to assist the patient with Transportation Needs.  *This was an unsuccessful contact*  Interpreter: No.   Interpreter Name & Language: none  Assessment: Patient experiencing Transportation barriers. CSW consulted by PCP due to patient's recent frequent ER visits.  Ongoing Intervention: Today CSW called patient x3 to remind patient that transportation is arranged for her appointment [facility] at [facility]) tomorrow. No answer at all attempts and no voicemail was available. CSW available from clinic as needed.  Review of patient status, including review of consultants reports, relevant laboratory and other test results, and collaboration with appropriate care team members and the patient's provider was performed as part of comprehensive patient evaluation and provision of services.     Follow up Plan: 1. CSW available from clinic as needed  Adriana Butts, LCSW [facility] [phone]

## 2020-06-03 ENCOUNTER — Ambulatory Visit: Payer: Medicare Other | Admitting: Nurse Practitioner

## 2020-06-03 DIAGNOSIS — I158 Other secondary hypertension: Secondary | ICD-10-CM | POA: Diagnosis not present

## 2020-06-03 LAB — URINALYSIS, ROUTINE W REFLEX MICROSCOPIC
Bilirubin Urine: NEGATIVE
Glucose, UA: 50 mg/dL — AB
Hgb urine dipstick: NEGATIVE
Ketones, ur: NEGATIVE mg/dL
Nitrite: NEGATIVE
Protein, ur: NEGATIVE mg/dL
Specific Gravity, Urine: 1.016 (ref 1.005–1.030)
pH: 5 (ref 5.0–8.0)

## 2020-06-03 MED ORDER — HYDRALAZINE HCL 25 MG PO TABS
50.0000 mg | ORAL_TABLET | Freq: Once | ORAL | Status: AC
  Administered 2020-06-03: 50 mg via ORAL
  Filled 2020-06-03: qty 2

## 2020-06-03 MED ORDER — CLONIDINE HCL 0.2 MG PO TABS
0.2000 mg | ORAL_TABLET | Freq: Once | ORAL | Status: AC
  Administered 2020-06-03: 0.2 mg via ORAL
  Filled 2020-06-03: qty 1

## 2020-06-03 MED ORDER — ACETAMINOPHEN 500 MG PO TABS
1000.0000 mg | ORAL_TABLET | Freq: Once | ORAL | Status: AC
  Administered 2020-06-03: 1000 mg via ORAL
  Filled 2020-06-03: qty 2

## 2020-06-03 NOTE — ED Notes (Signed)
Pt reporting painful urination and right flank pain that has been "going on for some time." MD made aware. Pt ambulated to bathroom.

## 2020-06-03 NOTE — ED Provider Notes (Signed)
[place] EMERGENCY DEPARTMENT Provider Note  CSN: [id number] Arrival date & time: 06/02/20 1519  Chief Complaint(s) Headache  HPI [name] is a 75 y.o. female with a past medical history listed below who presents to the emergency department with gradual onset of headache that began earlier this afternoon and worsened since onset.  Described as a aching/throbbing pain.  No alleviating or aggravating factors.  Patient has not tried taking any medicine.  Reports that she is unsure if she missed a dose of her medication this morning.  Reports that she takes a lot of medicines at home.  Just establish care with a primary care provider.  Denies any chest pain or shortness of breath.  No lower extremity edema.  No focal deficits.  No visual changes.  No other physical complaints.  HPI  Past Medical History Past Medical History:  Diagnosis Date  . Diabetes mellitus without complication ([place])   . GERD 09/04/2006   Qualifier: Diagnosis of  By: Suzie Portela    . History of echocardiogram    a. 2D ECHO: 11/06/2013 EF 65%; no WMA. Mild TR. PA pk pressure 43 mm HG  . Hypertension   . Stroke [facility])    Patient Active Problem List   Diagnosis Date Noted  . Cervical stenosis of spine 11/10/2019  . Acute respiratory disease due to COVID-19 virus 05/23/2018  . NSTEMI (non-ST elevated myocardial infarction) ([place]) 11/05/2013  . Spinal stenosis of cervical region 10/20/2012  . Prolonged QT interval 10/18/2012  . Left sided numbness 10/18/2012  . Palpitations 10/18/2012  . 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 Numbness 06/20/2012  . Diabetes mellitus ([place]) 06/20/2012  . CVA (cerebral infarction) 06/20/2012  . Hypokalemia 06/20/2012  . ARTHRALGIA 01/28/2009  . CHEST PAIN UNSPECIFIED 11/26/2008  . DEPRESSION 09/04/2006  . Essential hypertension 09/04/2006  . GERD 09/04/2006  . MULTIPLE SITES, ARTHRITIS-ACUTE/CHRONIC 09/04/2006  . LOW BACK PAIN, CHRONIC 09/04/2006    Home Medication(s) Prior to Admission medications   Medication Sig Start Date End Date Taking? Authorizing Provider  acetaminophen (TYLENOL) 325 MG tablet Take 2 tablets (650 mg total) by mouth every 6 (six) hours as needed for up to 30 doses for mild pain or moderate pain. 05/28/20   Wyvonnia Dusky, MD  albuterol (VENTOLIN HFA) 108 (90 Base) MCG/ACT inhaler Inhale 1-2 puffs into the lungs every 6 (six) hours as needed for wheezing or shortness of breath. 04/22/20   Vevelyn Francois, NP  aspirin EC 81 MG tablet Take 81 mg by mouth daily. Swallow whole.    [provider]  Blood Glucose Monitoring Suppl (ONETOUCH VERIO) w/Device KIT Use 1-4 times daily as needed/directed DX E11.9 04/25/20   Vevelyn Francois, NP  chlorhexidine (PERIDEX) 0.12 % solution Use as directed 15 mLs in the mouth or throat 2 (two) times daily for 10 days. 05/28/20 06/07/20  Wyvonnia Dusky, MD  chlorthalidone (HYGROTON) 25 MG tablet Take 1 tablet (25 mg total) by mouth daily. 04/22/20   Vevelyn Francois, NP  cloNIDine (CATAPRES) 0.2 MG tablet Take 1 tablet (0.2 mg total) by mouth 2 (two) times daily. 12/10/19   Jaynee Eagles, PA-C  clopidogrel (PLAVIX) 75 MG tablet Take 1 tablet (75 mg total) by mouth daily. 04/22/20   Vevelyn Francois, NP  gabapentin (NEURONTIN) 100 MG capsule Take 1 capsule (100 mg total) by mouth 3 (three) times daily. 04/18/20 06/17/20  Yard, Vernie Shanks, PA-C  glipiZIDE (GLUCOTROL) 5 MG tablet Take 1 tablet (5  mg total) by mouth 2 (two) times daily before a meal. 04/22/20 04/22/21  Vevelyn Francois, NP  hydrALAZINE (APRESOLINE) 50 MG tablet Take 1 tablet (50 mg total) by mouth 2 (two) times daily. 04/22/20   Vevelyn Francois, NP  Lancets [facility] ULTRASOFT) lancets Use 1-4 times daily as needed/directed  DX E11.9 04/25/20   Vevelyn Francois, NP  lidocaine (LIDODERM) 5 % Place 1 patch onto the skin daily. Remove & Discard patch within 12 hours or as directed by MD 04/22/20 04/22/21  Vevelyn Francois, NP  metFORMIN (GLUCOPHAGE)  500 MG tablet Take 1.5 tablets (750 mg total) by mouth 2 (two) times daily with a meal. 10/27/19 11/26/19  Veryl Speak, MD  methocarbamol (ROBAXIN) 500 MG tablet TAKE 1 TABLET BY MOUTH 4 TIMES DAILY. 05/09/20   Vevelyn Francois, NP  [facility] VERIO test strip Use 1-4 times daily as directed/needed   DX E11.9 04/25/20   Vevelyn Francois, NP  oxyCODONE (ROXICODONE) 5 MG immediate release tablet Take 1 tablet (5 mg total) by mouth every 6 (six) hours as needed for up to 4 doses for severe pain. 05/28/20   Wyvonnia Dusky, MD  penicillin v potassium (VEETID) 500 MG tablet Take 1 tablet (500 mg total) by mouth 4 (four) times daily for 7 days. 05/28/20 06/04/20  Wyvonnia Dusky, MD  potassium chloride (KLOR-CON) 10 MEQ tablet Take 1 tablet (10 mEq total) by mouth daily. 04/25/20   Noemi Chapel, MD  potassium chloride (MICRO-K) 10 MEQ CR capsule Take 10 mEq by mouth daily. 03/16/19   [provider]  rosuvastatin (CRESTOR) 10 MG tablet Take 1 tablet (10 mg total) by mouth at bedtime. 04/22/20 04/22/21  Vevelyn Francois, NP  MYRBETRIQ 25 MG TB24 tablet Take 25 mg by mouth daily. 02/03/18 01/05/20  [provider]                                                                                                                                    Past Surgical History Past Surgical History:  Procedure Laterality Date  . ABDOMINAL HYSTERECTOMY    . ANTERIOR CERVICAL DECOMPRESSION/DISCECTOMY FUSION 4 LEVELS N/A 11/11/2019   Procedure: Cervical three-four Cervical four-five Cervical five-six Cervical six-seven  Anterior cervical decompression/discectomy/fusion;  Surgeon: Erline Levine, MD;  Location: [place];  Service: Neurosurgery;  Laterality: N/A;  . LEFT HEART CATHETERIZATION WITH CORONARY ANGIOGRAM N/A 11/05/2013   Procedure: LEFT HEART CATHETERIZATION WITH CORONARY ANGIOGRAM;  Surgeon: Troy Sine, MD;  Location: [facility] CATH LAB;  Service: Cardiovascular;  Laterality: N/A;   Family History History  reviewed. No pertinent family history.  Social History Social History   Tobacco Use  . Smoking status: Never Smoker  . Smokeless tobacco: Never Used  Vaping Use  . Vaping Use: Never used  Substance Use Topics  . Alcohol use: No  . Drug use: No   Allergies Codeine, Ibuprofen, and  Imdur [isosorbide nitrate]  Review of Systems Review of Systems All other systems are reviewed and are negative for acute change except as noted in the HPI  Physical Exam Vital Signs  I have reviewed the triage vital signs BP (!) 215/71 (BP Location: Left Arm)   Pulse 65   Temp 98.5 F (36.9 C) (Oral)   Resp 18   SpO2 99%   Physical Exam Vitals reviewed.  Constitutional:      General: She is not in acute distress.    Appearance: She is well-developed. She is not diaphoretic.  HENT:     Head: Normocephalic and atraumatic.     Nose: Nose normal.  Eyes:     General: No scleral icterus.       Right eye: No discharge.        Left eye: No discharge.     Conjunctiva/sclera: Conjunctivae normal.     Pupils: Pupils are equal, round, and reactive to light.  Cardiovascular:     Rate and Rhythm: Normal rate and regular rhythm.     Heart sounds: No murmur heard. No friction rub. No gallop.   Pulmonary:     Effort: Pulmonary effort is normal. No respiratory distress.     Breath sounds: Normal breath sounds. No stridor. No rales.  Abdominal:     General: There is no distension.     Palpations: Abdomen is soft.     Tenderness: There is no abdominal tenderness.  Musculoskeletal:        General: No tenderness.     Cervical back: Normal range of motion and neck supple.  Skin:    General: Skin is warm and dry.     Findings: No erythema or rash.  Neurological:     Mental Status: She is alert and oriented to person, place, and time.     Cranial Nerves: Cranial nerves are intact.     Sensory: Sensation is intact.     Motor: Motor function is intact.     Coordination: Coordination is intact.      ED Results and Treatments Labs (all labs ordered are listed, but only abnormal results are displayed) Labs Reviewed  COMPREHENSIVE METABOLIC PANEL - Abnormal; Notable for the following components:      Result Value   Potassium 3.3 (*)    Chloride 95 (*)    Glucose, Bld 216 (*)    Creatinine, Ser 1.14 (*)    Total Protein 8.4 (*)    GFR, Estimated 51 (*)    All other components within normal limits  URINALYSIS, ROUTINE W REFLEX MICROSCOPIC - Abnormal; Notable for the following components:   APPearance CLOUDY (*)    Glucose, UA 50 (*)    Leukocytes,Ua MODERATE (*)    Bacteria, UA RARE (*)    All other components within normal limits  CBC WITH DIFFERENTIAL/PLATELET  CK  EKG  EKG Interpretation  Date/Time:    Ventricular Rate:    PR Interval:    QRS Duration:   QT Interval:    QTC Calculation:   R Axis:     Text Interpretation:        Radiology No results found.  Pertinent labs & imaging results that were available during my care of the patient were reviewed by me and considered in my medical decision making (see chart for details).  Medications Ordered in ED Medications  acetaminophen (TYLENOL) tablet 1,000 mg (1,000 mg Oral Given 06/03/20 0122)  cloNIDine (CATAPRES) tablet 0.2 mg (0.2 mg Oral Given 06/03/20 0123)  hydrALAZINE (APRESOLINE) tablet 50 mg (50 mg Oral Given 06/03/20 0123)                                                                                                                                    Procedures Procedures  (including critical care time)  Medical Decision Making / ED Course I have reviewed the nursing notes for this encounter and the patient's prior records (if available in EHR or on provided paperwork).   [name] was evaluated in Emergency Department on 06/03/2020 for the symptoms described in the history of  present illness. She was evaluated in the context of the global COVID-19 pandemic, which necessitated consideration that the patient might be at risk for infection with the SARS-CoV-2 virus that causes COVID-19. Institutional protocols and algorithms that pertain to the evaluation of patients at risk for COVID-19 are in a state of rapid change based on information released by regulatory bodies including the CDC and federal and state organizations. These policies and algorithms were followed during the patient's care in the ED.  Patient is hypertensive.  Possibly missed a dose of medication.  Labs reassuring showing improved renal function when compared to prior.  She is denying any chest pain or shortness of breath that would be worrisome for cardiac failure.  Headache was gradual onset so I have low suspicion for ICH or SAH.  Patient given Tylenol and home blood pressure medicine which resolved her headache and improve her blood pressure down to the 130s and 140s.      Final Clinical Impression(s) / ED Diagnoses Final diagnoses:  Bad headache  Other secondary hypertension    The patient appears reasonably screened and/or stabilized for discharge and I doubt any other medical condition or other [facility] requiring further screening, evaluation, or treatment in the ED at this time prior to discharge. Safe for discharge with strict return precautions.  Disposition: Discharge  Condition: Good  I have discussed the results, Dx and Tx plan with the patient/family who expressed understanding and agree(s) with the plan. Discharge instructions discussed at length. The patient/family was given strict return precautions who verbalized understanding of the instructions. No further questions at time of discharge.    ED Discharge Orders    None  Follow Up: Vevelyn Francois, NP [name] [place] [address] [phone]  Call  to schedule an appointment for close follow up     This  chart was dictated using voice recognition software.  Despite best efforts to proofread,  errors can occur which can change the documentation meaning.   Fatima Blank, MD 06/03/20 [phone]

## 2020-06-22 ENCOUNTER — Emergency Department (HOSPITAL_COMMUNITY): Payer: Medicare Other

## 2020-06-22 ENCOUNTER — Emergency Department (HOSPITAL_COMMUNITY)
Admission: EM | Admit: 2020-06-22 | Discharge: 2020-06-23 | Disposition: A | Discharge status: Home / Self Care | Payer: Medicare Other | Attending: Emergency Medicine | Admitting: Emergency Medicine

## 2020-06-22 ENCOUNTER — Encounter (HOSPITAL_COMMUNITY): Payer: Self-pay

## 2020-06-22 ENCOUNTER — Other Ambulatory Visit: Payer: Self-pay

## 2020-06-22 DIAGNOSIS — I10 Essential (primary) hypertension: Secondary | ICD-10-CM | POA: Diagnosis not present

## 2020-06-22 DIAGNOSIS — Z7984 Long term (current) use of oral hypoglycemic drugs: Secondary | ICD-10-CM | POA: Diagnosis not present

## 2020-06-22 DIAGNOSIS — R0789 Other chest pain: Secondary | ICD-10-CM | POA: Diagnosis not present

## 2020-06-22 DIAGNOSIS — Z8616 Personal history of COVID-19: Secondary | ICD-10-CM | POA: Insufficient documentation

## 2020-06-22 DIAGNOSIS — R079 Chest pain, unspecified: Secondary | ICD-10-CM

## 2020-06-22 DIAGNOSIS — K219 Gastro-esophageal reflux disease without esophagitis: Secondary | ICD-10-CM | POA: Diagnosis not present

## 2020-06-22 DIAGNOSIS — Z79899 Other long term (current) drug therapy: Secondary | ICD-10-CM | POA: Insufficient documentation

## 2020-06-22 DIAGNOSIS — E119 Type 2 diabetes mellitus without complications: Secondary | ICD-10-CM | POA: Insufficient documentation

## 2020-06-22 DIAGNOSIS — R072 Precordial pain: Secondary | ICD-10-CM | POA: Diagnosis not present

## 2020-06-22 DIAGNOSIS — Z7982 Long term (current) use of aspirin: Secondary | ICD-10-CM | POA: Insufficient documentation

## 2020-06-22 LAB — CBC
HCT: 36.7 % (ref 36.0–46.0)
Hemoglobin: 11.9 g/dL — ABNORMAL LOW (ref 12.0–15.0)
MCH: 30.4 pg (ref 26.0–34.0)
MCHC: 32.4 g/dL (ref 30.0–36.0)
MCV: 93.9 fL (ref 80.0–100.0)
Platelets: 291 10*3/uL (ref 150–400)
RBC: 3.91 MIL/uL (ref 3.87–5.11)
RDW: 12.2 % (ref 11.5–15.5)
WBC: 8.8 10*3/uL (ref 4.0–10.5)
nRBC: 0 % (ref 0.0–0.2)

## 2020-06-22 LAB — BASIC METABOLIC PANEL
Anion gap: 12 (ref 5–15)
BUN: 11 mg/dL (ref 8–23)
CO2: 25 mmol/L (ref 22–32)
Calcium: 8.8 mg/dL — ABNORMAL LOW (ref 8.9–10.3)
Chloride: 99 mmol/L (ref 98–111)
Creatinine, Ser: 1.17 mg/dL — ABNORMAL HIGH (ref 0.44–1.00)
GFR, Estimated: 49 mL/min — ABNORMAL LOW (ref >60)
Glucose, Bld: 172 mg/dL — ABNORMAL HIGH (ref 70–99)
Potassium: 3.2 mmol/L — ABNORMAL LOW (ref 3.5–5.1)
Sodium: 136 mmol/L (ref 135–145)

## 2020-06-22 LAB — TROPONIN I (HIGH SENSITIVITY): Troponin I (High Sensitivity): 12 ng/L (ref <18)

## 2020-06-22 NOTE — ED Provider Notes (Signed)
Emergency Medicine Provider Triage Evaluation Note  [name] , a 75 y.o. female  was evaluated in triage.  Pt complains of CP.  Review of Systems  Positive: cp Negative: Cough, fever, sob, lightheadedness, dizziness  Physical Exam  BP (!) 207/73 (BP Location: Left Arm)   Pulse 75   Temp 98.9 F (37.2 C) (Oral)   Resp 16   Ht 5\' 5"  (1.651 m)   Wt 68 kg   SpO2 96%   BMI 24.95 kg/m  Gen:   Awake, no distress   Resp:  Normal effort  MSK:   Moves extremities without difficulty  Other:    Medical Decision Making  Medically screening exam initiated at 8:48 PM.  Appropriate orders placed.  [name] was informed that the remainder of the evaluation will be completed by another provider, this initial triage assessment does not replace that evaluation, and the importance of remaining in the ED until their evaluation is complete.  Intermittent L sided CP for the past week, no other complaint.  Hx of stroke affected L side.  Pt was hypertensive. Poor historian   [name], [name] 06/22/20 2049    2050, MD 06/22/20 [phone]

## 2020-06-22 NOTE — ED Triage Notes (Signed)
Left sided chest pain x 1 week, non radiating. Denies any nausea, vomiting,dizziness and arm pain.

## 2020-06-23 DIAGNOSIS — R072 Precordial pain: Secondary | ICD-10-CM | POA: Diagnosis not present

## 2020-06-23 LAB — TROPONIN I (HIGH SENSITIVITY): Troponin I (High Sensitivity): 10 ng/L (ref <18)

## 2020-06-23 MED ORDER — CLONIDINE HCL 0.2 MG PO TABS
0.2000 mg | ORAL_TABLET | Freq: Once | ORAL | Status: AC
  Administered 2020-06-23: 0.2 mg via ORAL
  Filled 2020-06-23: qty 1

## 2020-06-26 NOTE — ED Provider Notes (Signed)
[facility] EMERGENCY DEPARTMENT Provider Note   CSN: [id number] Arrival date & time: 06/22/20  2026     History Chief Complaint  Patient presents with  . Chest Pain    [name] is a 75 y.o. female.   Chest Pain Pain location:  Substernal area and L chest Pain quality: aching and sharp   Pain radiates to:  Does not radiate Pain severity:  Mild Duration:  7 days Timing:  Intermittent Progression:  Resolved Context: not breathing, not lifting and not at rest   Relieved by:  None tried Worsened by:  Nothing      Past Medical History:  Diagnosis Date  . Diabetes mellitus without complication ([place])   . GERD 09/04/2006   Qualifier: Diagnosis of  By: Suzie Portela    . History of echocardiogram    a. 2D ECHO: 11/06/2013 EF 65%; no WMA. Mild TR. PA pk pressure 43 mm HG  . Hypertension   . Stroke [facility])     Patient Active Problem List   Diagnosis Date Noted  . Cervical stenosis of spine 11/10/2019  . Acute respiratory disease due to COVID-19 virus 05/23/2018  . NSTEMI (non-ST elevated myocardial infarction) ([place]) 11/05/2013  . Spinal stenosis of cervical region 10/20/2012  . Prolonged QT interval 10/18/2012  . Left sided numbness 10/18/2012  . Palpitations 10/18/2012  . 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 Numbness 06/20/2012  . Diabetes mellitus ([place]) 06/20/2012  . CVA (cerebral infarction) 06/20/2012  . Hypokalemia 06/20/2012  . ARTHRALGIA 01/28/2009  . CHEST PAIN UNSPECIFIED 11/26/2008  . DEPRESSION 09/04/2006  . Essential hypertension 09/04/2006  . GERD 09/04/2006  . MULTIPLE SITES, ARTHRITIS-ACUTE/CHRONIC 09/04/2006  . LOW BACK PAIN, CHRONIC 09/04/2006    Past Surgical History:  Procedure Laterality Date  . ABDOMINAL HYSTERECTOMY    . ANTERIOR CERVICAL DECOMPRESSION/DISCECTOMY FUSION 4 LEVELS N/A 11/11/2019   Procedure: Cervical three-four Cervical four-five Cervical five-six Cervical six-seven  Anterior cervical  decompression/discectomy/fusion;  Surgeon: Erline Levine, MD;  Location: [place];  Service: Neurosurgery;  Laterality: N/A;  . LEFT HEART CATHETERIZATION WITH CORONARY ANGIOGRAM N/A 11/05/2013   Procedure: LEFT HEART CATHETERIZATION WITH CORONARY ANGIOGRAM;  Surgeon: Troy Sine, MD;  Location: [facility] CATH LAB;  Service: Cardiovascular;  Laterality: N/A;     OB History   No obstetric history on file.     History reviewed. No pertinent family history.  Social History   Tobacco Use  . Smoking status: Never Smoker  . Smokeless tobacco: Never Used  Vaping Use  . Vaping Use: Never used  Substance Use Topics  . Alcohol use: No  . Drug use: No    Home Medications Prior to Admission medications   Medication Sig Start Date End Date Taking? Authorizing Provider  acetaminophen (TYLENOL) 325 MG tablet Take 2 tablets (650 mg total) by mouth every 6 (six) hours as needed for up to 30 doses for mild pain or moderate pain. 05/28/20   Wyvonnia Dusky, MD  albuterol (VENTOLIN HFA) 108 (90 Base) MCG/ACT inhaler Inhale 1-2 puffs into the lungs every 6 (six) hours as needed for wheezing or shortness of breath. 04/22/20   Vevelyn Francois, NP  aspirin EC 81 MG tablet Take 81 mg by mouth daily. Swallow whole.    [provider]  Blood Glucose Monitoring Suppl [facility] VERIO) w/Device KIT Use 1-4 times daily as needed/directed DX E11.9 04/25/20   Vevelyn Francois, NP  chlorthalidone (HYGROTON) 25 MG tablet Take 1 tablet (25  mg total) by mouth daily. 04/22/20   Vevelyn Francois, NP  cloNIDine (CATAPRES) 0.2 MG tablet Take 1 tablet (0.2 mg total) by mouth 2 (two) times daily. 12/10/19   Jaynee Eagles, PA-C  clopidogrel (PLAVIX) 75 MG tablet Take 1 tablet (75 mg total) by mouth daily. 04/22/20   Vevelyn Francois, NP  gabapentin (NEURONTIN) 100 MG capsule Take 1 capsule (100 mg total) by mouth 3 (three) times daily. 04/18/20 06/17/20  Margarita Mail, PA-C  glipiZIDE (GLUCOTROL) 5 MG tablet Take 1 tablet (5 mg total)  by mouth 2 (two) times daily before a meal. 04/22/20 04/22/21  Vevelyn Francois, NP  hydrALAZINE (APRESOLINE) 50 MG tablet Take 1 tablet (50 mg total) by mouth 2 (two) times daily. 04/22/20   Vevelyn Francois, NP  Lancets [facility] ULTRASOFT) lancets Use 1-4 times daily as needed/directed  DX E11.9 04/25/20   Vevelyn Francois, NP  lidocaine (LIDODERM) 5 % Place 1 patch onto the skin daily. Remove & Discard patch within 12 hours or as directed by MD 04/22/20 04/22/21  Vevelyn Francois, NP  metFORMIN (GLUCOPHAGE) 500 MG tablet Take 1.5 tablets (750 mg total) by mouth 2 (two) times daily with a meal. 10/27/19 11/26/19  Veryl Speak, MD  methocarbamol (ROBAXIN) 500 MG tablet TAKE 1 TABLET BY MOUTH 4 TIMES DAILY. 05/09/20   Vevelyn Francois, NP  [facility] VERIO test strip Use 1-4 times daily as directed/needed   DX E11.9 04/25/20   Vevelyn Francois, NP  oxyCODONE (ROXICODONE) 5 MG immediate release tablet Take 1 tablet (5 mg total) by mouth every 6 (six) hours as needed for up to 4 doses for severe pain. 05/28/20   Wyvonnia Dusky, MD  potassium chloride (KLOR-CON) 10 MEQ tablet Take 1 tablet (10 mEq total) by mouth daily. 04/25/20   Noemi Chapel, MD  potassium chloride (MICRO-K) 10 MEQ CR capsule Take 10 mEq by mouth daily. 03/16/19   [provider]  rosuvastatin (CRESTOR) 10 MG tablet Take 1 tablet (10 mg total) by mouth at bedtime. 04/22/20 04/22/21  Vevelyn Francois, NP  MYRBETRIQ 25 MG TB24 tablet Take 25 mg by mouth daily. 02/03/18 01/05/20  [provider]    Allergies    Codeine, Ibuprofen, and Imdur [isosorbide nitrate]  Review of Systems   Review of Systems  Cardiovascular: Positive for chest pain.  All other systems reviewed and are negative.   Physical Exam Updated Vital Signs BP (!) 145/71   Pulse 68   Temp 98.8 F (37.1 C) (Oral)   Resp 15   Ht $R'5\' 5"'dY$  (1.651 m)   Wt 68 kg   SpO2 96%   BMI 24.95 kg/m   Physical Exam Vitals and nursing note reviewed.  Constitutional:      Appearance:  She is well-developed.  HENT:     Head: Normocephalic and atraumatic.     Mouth/Throat:     Mouth: Mucous membranes are moist.     Pharynx: Oropharynx is clear.  Eyes:     Pupils: Pupils are equal, round, and reactive to light.  Cardiovascular:     Rate and Rhythm: Normal rate and regular rhythm.  Pulmonary:     Effort: No respiratory distress.     Breath sounds: No stridor.  Abdominal:     General: There is no distension.  Musculoskeletal:        General: Normal range of motion.     Cervical back: Normal range of motion.  Right lower leg: No tenderness. No edema.     Left lower leg: No tenderness. No edema.  Skin:    General: Skin is warm and dry.  Neurological:     Mental Status: She is alert. Mental status is at baseline.     ED Results / Procedures / Treatments   Labs (all labs ordered are listed, but only abnormal results are displayed) Labs Reviewed  BASIC METABOLIC PANEL - Abnormal; Notable for the following components:      Result Value   Potassium 3.2 (*)    Glucose, Bld 172 (*)    Creatinine, Ser 1.17 (*)    Calcium 8.8 (*)    GFR, Estimated 49 (*)    All other components within normal limits  CBC - Abnormal; Notable for the following components:   Hemoglobin 11.9 (*)    All other components within normal limits  TROPONIN I (HIGH SENSITIVITY)  TROPONIN I (HIGH SENSITIVITY)    EKG EKG Interpretation  Date/Time:  Wednesday June 22 2020 20:44:58 EDT Ventricular Rate:  77 PR Interval:  116 QRS Duration: 64 QT Interval:  370 QTC Calculation: 418 R Axis:   55 Text Interpretation: Poor data quality, interpretation may be adversely affected Normal sinus rhythm Cannot rule out Anterior infarct , age undetermined Abnormal ECG Confirmed by Merrily Pew [phone]) on 06/23/2020 12:19:43 AM   Radiology No results found.  Procedures Procedures   Medications Ordered in ED Medications  cloNIDine (CATAPRES) tablet 0.2 mg (0.2 mg Oral Given 06/23/20 0155)     ED Course  I have reviewed the triage vital signs and the nursing notes.  Pertinent labs & imaging results that were available during my care of the patient were reviewed by me and considered in my medical decision making (see chart for details).    MDM Rules/Calculators/A&P                          No obvious sources for the chest pain. Two troponins negative. ECG ok. Doubt ACS. Doubt PE. No e/o abnormalities on xr to explain symptoms.   Final Clinical Impression(s) / ED Diagnoses Final diagnoses:  Hypertension, unspecified type  Nonspecific chest pain    Rx / DC Orders ED Discharge Orders    None       Kailen Name, Corene Cornea, MD 06/26/20 [phone]

## 2020-06-28 ENCOUNTER — Other Ambulatory Visit: Payer: Self-pay

## 2020-06-28 ENCOUNTER — Emergency Department (HOSPITAL_COMMUNITY)
Admission: EM | Admit: 2020-06-28 | Discharge: 2020-06-29 | Disposition: A | Discharge status: Home / Self Care | Payer: Medicare Other | Attending: Emergency Medicine | Admitting: Emergency Medicine

## 2020-06-28 ENCOUNTER — Encounter (HOSPITAL_COMMUNITY): Payer: Self-pay

## 2020-06-28 DIAGNOSIS — I10 Essential (primary) hypertension: Secondary | ICD-10-CM | POA: Diagnosis not present

## 2020-06-28 DIAGNOSIS — Z8616 Personal history of COVID-19: Secondary | ICD-10-CM | POA: Insufficient documentation

## 2020-06-28 DIAGNOSIS — H2513 Age-related nuclear cataract, bilateral: Secondary | ICD-10-CM | POA: Diagnosis not present

## 2020-06-28 DIAGNOSIS — Z7984 Long term (current) use of oral hypoglycemic drugs: Secondary | ICD-10-CM | POA: Insufficient documentation

## 2020-06-28 DIAGNOSIS — Z7982 Long term (current) use of aspirin: Secondary | ICD-10-CM | POA: Insufficient documentation

## 2020-06-28 DIAGNOSIS — K0889 Other specified disorders of teeth and supporting structures: Secondary | ICD-10-CM | POA: Diagnosis present

## 2020-06-28 DIAGNOSIS — K029 Dental caries, unspecified: Secondary | ICD-10-CM | POA: Insufficient documentation

## 2020-06-28 DIAGNOSIS — Z79899 Other long term (current) drug therapy: Secondary | ICD-10-CM | POA: Insufficient documentation

## 2020-06-28 DIAGNOSIS — H25013 Cortical age-related cataract, bilateral: Secondary | ICD-10-CM | POA: Diagnosis not present

## 2020-06-28 DIAGNOSIS — H18413 Arcus senilis, bilateral: Secondary | ICD-10-CM | POA: Diagnosis not present

## 2020-06-28 DIAGNOSIS — E119 Type 2 diabetes mellitus without complications: Secondary | ICD-10-CM | POA: Insufficient documentation

## 2020-06-28 DIAGNOSIS — Z7902 Long term (current) use of antithrombotics/antiplatelets: Secondary | ICD-10-CM | POA: Diagnosis not present

## 2020-06-28 DIAGNOSIS — H2511 Age-related nuclear cataract, right eye: Secondary | ICD-10-CM | POA: Diagnosis not present

## 2020-06-28 DIAGNOSIS — H25043 Posterior subcapsular polar age-related cataract, bilateral: Secondary | ICD-10-CM | POA: Diagnosis not present

## 2020-06-28 MED ORDER — ACETAMINOPHEN 325 MG PO TABS
650.0000 mg | ORAL_TABLET | Freq: Once | ORAL | Status: AC
  Administered 2020-06-28: 650 mg via ORAL
  Filled 2020-06-28: qty 2

## 2020-06-28 NOTE — ED Provider Notes (Addendum)
Emergency Medicine Provider Triage Evaluation Note  [name] , a 75 y.o. female  was evaluated in triage.  Pt complains of left upper tooth pain.  Pt reports broken teeth.  Pain started yesterday and is a 10/10.  She hs taken 2 tylenol with some relief but then pain returned. Does not have a dentist.   Review of Systems  Positive: Dental pain Negative: Fever, chills, change in voice, trouble swallowing  Physical Exam  BP (!) 151/71 (BP Location: Right Arm)   Pulse 73   Temp 97.9 F (36.6 C) (Oral)   Resp 18   Ht 5\' 5"  (1.651 m)   Wt 68 kg   SpO2 100%   BMI 24.95 kg/m  Gen:   Awake, no distress  Resp:  Normal effort  MSK:   Moves extremities without difficulty  Other:  Broken molar in the left upper, no gingival swelling or abscess  Medical Decision Making  Medically screening exam initiated at 10:53 PM.  Appropriate orders placed.  [name] was informed that the remainder of the evaluation will be completed by another provider, this initial triage assessment does not replace that evaluation, and the importance of remaining in the ED until their evaluation is complete.  Dental pain, no gross abscess, tylenol given      [name] 06/28/20 2253    2254, MD 06/29/20 [phone]

## 2020-06-28 NOTE — ED Triage Notes (Signed)
Patient reports dental pain since yesterday, denies fever or chills

## 2020-06-29 DIAGNOSIS — K029 Dental caries, unspecified: Secondary | ICD-10-CM | POA: Diagnosis not present

## 2020-06-29 MED ORDER — AMOXICILLIN 500 MG PO CAPS
500.0000 mg | ORAL_CAPSULE | Freq: Once | ORAL | Status: AC
  Administered 2020-06-29: 500 mg via ORAL
  Filled 2020-06-29: qty 1

## 2020-06-29 MED ORDER — AMOXICILLIN 500 MG PO CAPS: 500.0000 mg | ORAL_CAPSULE | Freq: Two times a day (BID) | ORAL | 0 refills | Status: DC

## 2020-06-29 NOTE — ED Provider Notes (Signed)
[facility] EMERGENCY DEPARTMENT Provider Note   CSN: [id number] Arrival date & time: 06/28/20  2148     History Chief Complaint  Patient presents with  . Dental Pain    [name] is a 75 y.o. female.  The history is provided by the patient.  Dental Pain Location:  Upper Quality:  Aching Severity:  Moderate Onset quality:  Gradual Timing:  Constant Progression:  Worsening Chronicity:  New Relieved by:  Nothing Associated symptoms: no fever   Patient reports dental pain.  No other acute complaints     Past Medical History:  Diagnosis Date  . Diabetes mellitus without complication ([place])   . GERD 09/04/2006   Qualifier: Diagnosis of  By: Suzie Portela    . History of echocardiogram    a. 2D ECHO: 11/06/2013 EF 65%; no WMA. Mild TR. PA pk pressure 43 mm HG  . Hypertension   . Stroke [facility])     Patient Active Problem List   Diagnosis Date Noted  . Cervical stenosis of spine 11/10/2019  . Acute respiratory disease due to COVID-19 virus 05/23/2018  . NSTEMI (non-ST elevated myocardial infarction) ([place]) 11/05/2013  . Spinal stenosis of cervical region 10/20/2012  . Prolonged QT interval 10/18/2012  . Left sided numbness 10/18/2012  . Palpitations 10/18/2012  . 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 Numbness 06/20/2012  . Diabetes mellitus ([place]) 06/20/2012  . CVA (cerebral infarction) 06/20/2012  . Hypokalemia 06/20/2012  . ARTHRALGIA 01/28/2009  . CHEST PAIN UNSPECIFIED 11/26/2008  . DEPRESSION 09/04/2006  . Essential hypertension 09/04/2006  . GERD 09/04/2006  . MULTIPLE SITES, ARTHRITIS-ACUTE/CHRONIC 09/04/2006  . LOW BACK PAIN, CHRONIC 09/04/2006    Past Surgical History:  Procedure Laterality Date  . ABDOMINAL HYSTERECTOMY    . ANTERIOR CERVICAL DECOMPRESSION/DISCECTOMY FUSION 4 LEVELS N/A 11/11/2019   Procedure: Cervical three-four Cervical four-five Cervical five-six Cervical six-seven  Anterior cervical  decompression/discectomy/fusion;  Surgeon: Erline Levine, MD;  Location: [place];  Service: Neurosurgery;  Laterality: N/A;  . LEFT HEART CATHETERIZATION WITH CORONARY ANGIOGRAM N/A 11/05/2013   Procedure: LEFT HEART CATHETERIZATION WITH CORONARY ANGIOGRAM;  Surgeon: Troy Sine, MD;  Location: [facility] CATH LAB;  Service: Cardiovascular;  Laterality: N/A;     OB History   No obstetric history on file.     History reviewed. No pertinent family history.  Social History   Tobacco Use  . Smoking status: Never Smoker  . Smokeless tobacco: Never Used  Vaping Use  . Vaping Use: Never used  Substance Use Topics  . Alcohol use: No  . Drug use: No    Home Medications Prior to Admission medications   Medication Sig Start Date End Date Taking? Authorizing Provider  amoxicillin (AMOXIL) 500 MG capsule Take 1 capsule (500 mg total) by mouth 2 (two) times daily. 06/29/20  Yes Ripley Fraise, MD  acetaminophen (TYLENOL) 325 MG tablet Take 2 tablets (650 mg total) by mouth every 6 (six) hours as needed for up to 30 doses for mild pain or moderate pain. 05/28/20   Wyvonnia Dusky, MD  albuterol (VENTOLIN HFA) 108 (90 Base) MCG/ACT inhaler Inhale 1-2 puffs into the lungs every 6 (six) hours as needed for wheezing or shortness of breath. 04/22/20   Vevelyn Francois, NP  aspirin EC 81 MG tablet Take 81 mg by mouth daily. Swallow whole.    [provider]  Blood Glucose Monitoring Suppl (ONETOUCH VERIO) w/Device KIT Use 1-4 times daily as needed/directed DX E11.9 04/25/20  Vevelyn Francois, NP  chlorthalidone (HYGROTON) 25 MG tablet Take 1 tablet (25 mg total) by mouth daily. 04/22/20   Vevelyn Francois, NP  cloNIDine (CATAPRES) 0.2 MG tablet Take 1 tablet (0.2 mg total) by mouth 2 (two) times daily. 12/10/19   Jaynee Eagles, PA-C  clopidogrel (PLAVIX) 75 MG tablet Take 1 tablet (75 mg total) by mouth daily. 04/22/20   Vevelyn Francois, NP  gabapentin (NEURONTIN) 100 MG capsule Take 1 capsule (100 mg total) by  mouth 3 (three) times daily. 04/18/20 06/17/20  Margarita Mail, PA-C  glipiZIDE (GLUCOTROL) 5 MG tablet Take 1 tablet (5 mg total) by mouth 2 (two) times daily before a meal. 04/22/20 04/22/21  Vevelyn Francois, NP  hydrALAZINE (APRESOLINE) 50 MG tablet Take 1 tablet (50 mg total) by mouth 2 (two) times daily. 04/22/20   Vevelyn Francois, NP  Lancets [facility] ULTRASOFT) lancets Use 1-4 times daily as needed/directed  DX E11.9 04/25/20   Vevelyn Francois, NP  lidocaine (LIDODERM) 5 % Place 1 patch onto the skin daily. Remove & Discard patch within 12 hours or as directed by MD 04/22/20 04/22/21  Vevelyn Francois, NP  metFORMIN (GLUCOPHAGE) 500 MG tablet Take 1.5 tablets (750 mg total) by mouth 2 (two) times daily with a meal. 10/27/19 11/26/19  Veryl Speak, MD  methocarbamol (ROBAXIN) 500 MG tablet TAKE 1 TABLET BY MOUTH 4 TIMES DAILY. 05/09/20   Vevelyn Francois, NP  [facility] VERIO test strip Use 1-4 times daily as directed/needed   DX E11.9 04/25/20   Vevelyn Francois, NP  oxyCODONE (ROXICODONE) 5 MG immediate release tablet Take 1 tablet (5 mg total) by mouth every 6 (six) hours as needed for up to 4 doses for severe pain. 05/28/20   Wyvonnia Dusky, MD  potassium chloride (KLOR-CON) 10 MEQ tablet Take 1 tablet (10 mEq total) by mouth daily. 04/25/20   Noemi Chapel, MD  potassium chloride (MICRO-K) 10 MEQ CR capsule Take 10 mEq by mouth daily. 03/16/19   [provider]  rosuvastatin (CRESTOR) 10 MG tablet Take 1 tablet (10 mg total) by mouth at bedtime. 04/22/20 04/22/21  Vevelyn Francois, NP  MYRBETRIQ 25 MG TB24 tablet Take 25 mg by mouth daily. 02/03/18 01/05/20  [provider]    Allergies    Codeine, Ibuprofen, and Imdur [isosorbide nitrate]  Review of Systems   Review of Systems  Constitutional: Negative for fever.  HENT: Positive for dental problem.     Physical Exam Updated Vital Signs BP 112/66 (BP Location: Right Arm)   Pulse 69   Temp 97.9 F (36.6 C) (Oral)   Resp 18   Ht 1.651 m  (_0 )   Wt 68 kg   SpO2 100%   BMI 24.95 kg/m   Physical Exam  CONSTITUTIONAL: Well developed/well nourished HEAD AND FACE: Normocephalic/atraumatic EYES: EOMI/PERRL ENMT: Mucous membranes moist.  Poor dentition.  No trismus.  No focal abscess noted.   NECK: supple no meningeal signs LUNGS:  no apparent distress ABDOMEN: soft, nontender, no rebound or guarding NEURO: Pt is awake/alert, moves all extremitiesx4 EXTREMITIES:full ROM SKIN: warm, color normal  ED Results / Procedures / Treatments   Labs (all labs ordered are listed, but only abnormal results are displayed) Labs Reviewed - No data to display  EKG None  Radiology No results found.  Procedures Procedures   Medications Ordered in ED Medications  amoxicillin (AMOXIL) capsule 500 mg (has no administration in time range)  acetaminophen (  TYLENOL) tablet 650 mg (650 mg Oral Given 06/28/20 2255)    ED Course  I have reviewed the triage vital signs and the nursing notes.   MDM Rules/Calculators/A&P                          Patient was already on antibiotics last month for dental pain, she reports she completed that course.  She is requesting further treatment and referral to dentistry Final Clinical Impression(s) / ED Diagnoses Final diagnoses:  Dental caries    Rx / DC Orders ED Discharge Orders         Ordered    amoxicillin (AMOXIL) 500 MG capsule  2 times daily        06/29/20 0318           Ripley Fraise, MD 06/29/20 [phone]

## 2020-07-05 ENCOUNTER — Emergency Department (HOSPITAL_COMMUNITY): Payer: Medicare Other

## 2020-07-05 ENCOUNTER — Emergency Department (HOSPITAL_COMMUNITY)
Admission: EM | Admit: 2020-07-05 | Discharge: 2020-07-06 | Disposition: A | Discharge status: Home / Self Care | Payer: Medicare Other | Attending: Emergency Medicine | Admitting: Emergency Medicine

## 2020-07-05 ENCOUNTER — Encounter (HOSPITAL_COMMUNITY): Payer: Self-pay

## 2020-07-05 DIAGNOSIS — M25561 Pain in right knee: Secondary | ICD-10-CM | POA: Diagnosis not present

## 2020-07-05 DIAGNOSIS — Z7984 Long term (current) use of oral hypoglycemic drugs: Secondary | ICD-10-CM | POA: Insufficient documentation

## 2020-07-05 DIAGNOSIS — M25562 Pain in left knee: Secondary | ICD-10-CM | POA: Insufficient documentation

## 2020-07-05 DIAGNOSIS — I10 Essential (primary) hypertension: Secondary | ICD-10-CM | POA: Diagnosis not present

## 2020-07-05 DIAGNOSIS — E876 Hypokalemia: Secondary | ICD-10-CM | POA: Diagnosis not present

## 2020-07-05 DIAGNOSIS — E119 Type 2 diabetes mellitus without complications: Secondary | ICD-10-CM | POA: Insufficient documentation

## 2020-07-05 DIAGNOSIS — R079 Chest pain, unspecified: Secondary | ICD-10-CM | POA: Insufficient documentation

## 2020-07-05 DIAGNOSIS — Z79899 Other long term (current) drug therapy: Secondary | ICD-10-CM | POA: Diagnosis not present

## 2020-07-05 DIAGNOSIS — R519 Headache, unspecified: Secondary | ICD-10-CM | POA: Insufficient documentation

## 2020-07-05 DIAGNOSIS — Z8616 Personal history of COVID-19: Secondary | ICD-10-CM | POA: Insufficient documentation

## 2020-07-05 LAB — BASIC METABOLIC PANEL
Anion gap: 12 (ref 5–15)
BUN: 21 mg/dL (ref 8–23)
CO2: 28 mmol/L (ref 22–32)
Calcium: 9.2 mg/dL (ref 8.9–10.3)
Chloride: 101 mmol/L (ref 98–111)
Creatinine, Ser: 1.36 mg/dL — ABNORMAL HIGH (ref 0.44–1.00)
GFR, Estimated: 41 mL/min — ABNORMAL LOW (ref >60)
Glucose, Bld: 120 mg/dL — ABNORMAL HIGH (ref 70–99)
Potassium: 3.2 mmol/L — ABNORMAL LOW (ref 3.5–5.1)
Sodium: 141 mmol/L (ref 135–145)

## 2020-07-05 LAB — CBC
HCT: 37 % (ref 36.0–46.0)
Hemoglobin: 11.5 g/dL — ABNORMAL LOW (ref 12.0–15.0)
MCH: 29.3 pg (ref 26.0–34.0)
MCHC: 31.1 g/dL (ref 30.0–36.0)
MCV: 94.4 fL (ref 80.0–100.0)
Platelets: 300 10*3/uL (ref 150–400)
RBC: 3.92 MIL/uL (ref 3.87–5.11)
RDW: 12.6 % (ref 11.5–15.5)
WBC: 9.9 10*3/uL (ref 4.0–10.5)
nRBC: 0 % (ref 0.0–0.2)

## 2020-07-05 LAB — TROPONIN I (HIGH SENSITIVITY): Troponin I (High Sensitivity): 8 ng/L (ref <18)

## 2020-07-05 NOTE — ED Triage Notes (Signed)
Pt reports that she has been having CP, L sided and a headache and bilateral leg weakness for several days

## 2020-07-05 NOTE — ED Provider Notes (Signed)
Emergency Medicine Provider Triage Evaluation Note  [name] , a 75 y.o. female  was evaluated in triage.  Pt complains of chest pain   Review of Systems  Positive: Pain mid chest Negative: No cough  Physical Exam  BP 136/78   Pulse 71   Temp 98 F (36.7 C)   Resp 18   SpO2 100%  Gen:   Awake, no distress   Resp:  Normal effort  MSK:   Moves extremities without difficulty  Other:    Medical Decision Making  Medically screening exam initiated at 9:27 PM.  Appropriate orders placed.  [name] was informed that the remainder of the evaluation will be completed by another provider, this initial triage assessment does not replace that evaluation, and the importance of remaining in the ED until their evaluation is complete.     Osie Cheeks 07/05/20 2128    Gwyneth Sprout, MD 07/08/20 1351

## 2020-07-06 LAB — TROPONIN I (HIGH SENSITIVITY): Troponin I (High Sensitivity): 7 ng/L (ref <18)

## 2020-07-06 MED ORDER — ACETAMINOPHEN 325 MG PO TABS
650.0000 mg | ORAL_TABLET | Freq: Once | ORAL | Status: AC
  Administered 2020-07-06: 650 mg via ORAL
  Filled 2020-07-06: qty 2

## 2020-07-06 NOTE — ED Notes (Signed)
Patient refusing to get in a gown, reports she didn't have to last time and that she is not going to because its too cold in here.  Patient got agitated.  Informed her I would let the MD make the decision.  Also requesting compression socks but that is a MD order so at this time unable to provide that to patient.

## 2020-07-06 NOTE — ED Provider Notes (Signed)
[facility] EMERGENCY DEPARTMENT Provider Note   CSN: [id number] Arrival date & time: 07/05/20  2057     History Chief Complaint  Patient presents with   Chest Pain    [name] is a 75 y.o. female.  She was here with various complaints.  She said she had some left-sided chest pain on and off for a while.  She said her knees and her feet also hurt her and she wants compression stockings and a knee sleeve.  She is also hungry and has not eaten in a while.  She wants to know why she waited in the waiting room so long.  Also complaining of some headache.  She is unclear when this has been going on.  She thinks it is from not taking her medicine because she has been waiting in the waiting room so long.  She said she has not taken her medicine in a few days.  The history is provided by the patient.  Chest Pain Pain location:  L chest Pain quality: aching   Pain severity:  Unable to specify Onset quality:  Unable to specify Timing:  Intermittent Progression:  Unchanged Chronicity:  Recurrent Relieved by:  None tried Worsened by:  Nothing Ineffective treatments:  None tried Associated symptoms: headache   Associated symptoms: no abdominal pain, no cough, no diaphoresis, no nausea, no shortness of breath and no vomiting       Past Medical History:  Diagnosis Date   Diabetes mellitus without complication ([place])    GERD 09/04/2006   Qualifier: Diagnosis of  By: Suzie Portela     History of echocardiogram    a. 2D ECHO: 11/06/2013 EF 65%; no WMA. Mild TR. 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   Numbness 06/20/2012   Diabetes mellitus ([place]) 06/20/2012   CVA (cerebral infarction) 06/20/2012   Hypokalemia 06/20/2012   ARTHRALGIA 01/28/2009   CHEST PAIN UNSPECIFIED 11/26/2008   DEPRESSION 09/04/2006   Essential hypertension 09/04/2006   GERD 09/04/2006   MULTIPLE SITES, ARTHRITIS-ACUTE/CHRONIC 09/04/2006   LOW BACK PAIN, CHRONIC 09/04/2006    Past Surgical History:  Procedure Laterality Date   ABDOMINAL HYSTERECTOMY     ANTERIOR CERVICAL DECOMPRESSION/DISCECTOMY FUSION 4 LEVELS N/A 11/11/2019   Procedure: Cervical three-four Cervical four-five Cervical five-six Cervical six-seven  Anterior cervical decompression/discectomy/fusion;  Surgeon: Erline Levine, MD;  Location: [place];  Service: Neurosurgery;  Laterality: N/A;   LEFT HEART CATHETERIZATION WITH CORONARY ANGIOGRAM N/A 11/05/2013   Procedure: LEFT HEART CATHETERIZATION WITH CORONARY ANGIOGRAM;  Surgeon: Troy Sine, MD;  Location: [facility] CATH LAB;  Service: Cardiovascular;  Laterality: N/A;     OB History   No obstetric history on file.     No family history on file.  Social History   Tobacco Use   Smoking status: Never   Smokeless tobacco: Never  Vaping Use   Vaping Use: Never used  Substance Use Topics   Alcohol use: No   Drug use: No    Home Medications Prior to Admission medications  Medication Sig Start Date End Date Taking? Authorizing Provider  acetaminophen (TYLENOL) 325 MG tablet Take 2 tablets (650 mg total) by mouth every 6 (six) hours as needed for up to 30 doses for mild pain or moderate pain. 05/28/20   Wyvonnia Dusky, MD  albuterol (VENTOLIN HFA) 108 (90 Base) MCG/ACT inhaler Inhale 1-2 puffs into the lungs every 6 (six) hours as needed for wheezing or shortness of breath. 04/22/20   Vevelyn Francois, NP  amoxicillin (AMOXIL) 500 MG capsule Take 1 capsule (500 mg total) by mouth 2 (two) times daily. 06/29/20   Ripley Fraise, MD   aspirin EC 81 MG tablet Take 81 mg by mouth daily. Swallow whole.    [provider]  Blood Glucose Monitoring Suppl [facility] VERIO) w/Device KIT Use 1-4 times daily as needed/directed DX E11.9 04/25/20   Vevelyn Francois, NP  chlorthalidone (HYGROTON) 25 MG tablet Take 1 tablet (25 mg total) by mouth daily. 04/22/20   Vevelyn Francois, NP  cloNIDine (CATAPRES) 0.2 MG tablet Take 1 tablet (0.2 mg total) by mouth 2 (two) times daily. 12/10/19   Jaynee Eagles, PA-C  clopidogrel (PLAVIX) 75 MG tablet Take 1 tablet (75 mg total) by mouth daily. 04/22/20   Vevelyn Francois, NP  gabapentin (NEURONTIN) 100 MG capsule Take 1 capsule (100 mg total) by mouth 3 (three) times daily. 04/18/20 06/17/20  Margarita Mail, PA-C  glipiZIDE (GLUCOTROL) 5 MG tablet Take 1 tablet (5 mg total) by mouth 2 (two) times daily before a meal. 04/22/20 04/22/21  Vevelyn Francois, NP  hydrALAZINE (APRESOLINE) 50 MG tablet Take 1 tablet (50 mg total) by mouth 2 (two) times daily. 04/22/20   Vevelyn Francois, NP  Lancets [facility] ULTRASOFT) lancets Use 1-4 times daily as needed/directed  DX E11.9 04/25/20   Vevelyn Francois, NP  lidocaine (LIDODERM) 5 % Place 1 patch onto the skin daily. Remove & Discard patch within 12 hours or as directed by MD 04/22/20 04/22/21  Vevelyn Francois, NP  metFORMIN (GLUCOPHAGE) 500 MG tablet Take 1.5 tablets (750 mg total) by mouth 2 (two) times daily with a meal. 10/27/19 11/26/19  Veryl Speak, MD  methocarbamol (ROBAXIN) 500 MG tablet TAKE 1 TABLET BY MOUTH 4 TIMES DAILY. 05/09/20   Vevelyn Francois, NP  [facility] VERIO test strip Use 1-4 times daily as directed/needed   DX E11.9 04/25/20   Vevelyn Francois, NP  oxyCODONE (ROXICODONE) 5 MG immediate release tablet Take 1 tablet (5 mg total) by mouth every 6 (six) hours as needed for up to 4 doses for severe pain. 05/28/20   Wyvonnia Dusky, MD  potassium chloride (KLOR-CON) 10 MEQ tablet Take 1 tablet (10 mEq total) by mouth daily. 04/25/20   Noemi Chapel, MD  potassium  chloride (MICRO-K) 10 MEQ CR capsule Take 10 mEq by mouth daily. 03/16/19   [provider]  rosuvastatin (CRESTOR) 10 MG tablet Take 1 tablet (10 mg total) by mouth at bedtime. 04/22/20 04/22/21  Vevelyn Francois, NP  MYRBETRIQ 25 MG TB24 tablet Take 25 mg by mouth daily. 02/03/18 01/05/20  [provider]    Allergies    Codeine, Ibuprofen, and Imdur [isosorbide nitrate]  Review of Systems   Review of Systems  Constitutional:  Negative for diaphoresis.  HENT:  Negative for sore throat.   Eyes:  Negative for visual disturbance.  Respiratory:  Negative for cough and shortness of breath.   Cardiovascular:  Positive for chest pain.  Gastrointestinal:  Negative for abdominal pain, nausea and vomiting.  Genitourinary:  Negative for dysuria.  Musculoskeletal:  Positive for gait problem. Negative for neck pain.  Skin:  Negative for rash.  Neurological:  Positive for headaches.   Physical Exam Updated Vital Signs BP 140/67 (BP Location: Left Arm)   Pulse 72   Temp 98 F (36.7 C) (Oral)   Resp 16   SpO2 99%   Physical Exam Vitals and nursing note reviewed.  Constitutional:      General: She is not in acute distress.    Appearance: Normal appearance. She is well-developed.  HENT:     Head: Normocephalic and atraumatic.  Eyes:     Conjunctiva/sclera: Conjunctivae normal.  Cardiovascular:     Rate and Rhythm: Normal rate and regular rhythm.     Heart sounds: No murmur heard. Pulmonary:     Effort: Pulmonary effort is normal. No respiratory distress.     Breath sounds: Normal breath sounds.  Abdominal:     Palpations: Abdomen is soft.     Tenderness: There is no abdominal tenderness. There is no guarding or rebound.  Musculoskeletal:        General: No swelling or deformity. Normal range of motion.     Cervical back: Neck supple.     Right lower leg: No edema.     Left lower leg: No edema.  Skin:    General: Skin is warm and dry.  Neurological:     General: No  focal deficit present.     Mental Status: She is alert and oriented to person, place, and time.     Cranial Nerves: No cranial nerve deficit.     Sensory: No sensory deficit.     Motor: No weakness.    ED Results / Procedures / Treatments   Labs (all labs ordered are listed, but only abnormal results are displayed) Labs Reviewed  BASIC METABOLIC PANEL - Abnormal; Notable for the following components:      Result Value   Potassium 3.2 (*)    Glucose, Bld 120 (*)    Creatinine, Ser 1.36 (*)    GFR, Estimated 41 (*)    All other components within normal limits  CBC - Abnormal; Notable for the following components:   Hemoglobin 11.5 (*)    All other components within normal limits  TROPONIN I (HIGH SENSITIVITY)  TROPONIN I (HIGH SENSITIVITY)    EKG None  Radiology DG Chest 2 View  Result Date: 07/05/2020 CLINICAL DATA:  Chest pain EXAM: CHEST - 2 VIEW COMPARISON:  06/22/2020 FINDINGS: Surgical hardware in the cervical spine. No focal opacity or pleural effusion. Mild bronchitic changes. Normal cardiomediastinal silhouette. No pneumothorax. IMPRESSION: No active cardiopulmonary disease. Electronically Signed   By: Donavan Foil M.D.   On: 07/05/2020 21:41    Procedures Procedures   Medications Ordered in ED Medications  acetaminophen (TYLENOL) tablet 650 mg (650 mg Oral Given 07/06/20 0719)    ED Course  I have reviewed the triage vital signs and the nursing notes.  Pertinent labs & imaging results that were available during my care of the patient were reviewed by me and considered in my medical decision making (see chart for details).  Clinical Course as of 07/06/20 2017  Wed Jul 06, 2020  0706 EKG not crossing into epic.  Normal sinus rhythm normal intervals nonspecific T wave changes similar to prior EKG from 06/22/2020. [MB]  1001 Reviewed prior visits.  She is here fairly frequently for chest  pain headache and other nonspecific complaints.  These appear to be chronic in  nature.  No indications for admission. [MB]    Clinical Course User Index [MB] Hayden Rasmussen, MD   MDM Rules/Calculators/A&P                         This patient complains of chest pain headache hunger pain in her knees and feet; this involves an extensive number of treatment Options and is a complaint that carries with it a high risk of complications and Morbidity. The differential includes ACS, anemia, arthritis, pneumonia, reflux  I ordered, reviewed and interpreted labs, which included CBC with normal white count, hemoglobin stable from priors, creatinine elevated but better than priors, mildly low potassium, she is on potassium supplements, troponins flat I ordered medication Tylenol with improvement in her headache I ordered imaging studies which included chest x-ray and I independently    visualized and interpreted imaging which showed no acute findings Previous records obtained and reviewed in epic, multiple presentations for similar complaints  After the interventions stated above, I reevaluated the patient and found patient to eaten.  She is received knee sleeves.  Recommended close follow-up with primary care doctor.  Return instructions discussed   Final Clinical Impression(s) / ED Diagnoses Final diagnoses:  Nonspecific chest pain  Generalized headache  Pain in both knees, unspecified chronicity  Hypokalemia    Rx / DC Orders ED Discharge Orders     None        Hayden Rasmussen, MD 07/06/20 2024

## 2020-07-06 NOTE — Discharge Instructions (Addendum)
You are seen in the emergency department for evaluation of chest pain along with headache and knee pain foot pain.  You had blood work chest x-ray EKG.  Your potassium was a little low.  Please continue your regular medications and follow-up with your primary care doctor.  Knee sleeves for comfort.

## 2020-07-12 ENCOUNTER — Emergency Department (HOSPITAL_COMMUNITY)
Admission: EM | Admit: 2020-07-12 | Discharge: 2020-07-13 | Disposition: A | Discharge status: Home / Self Care | Payer: Medicare Other | Attending: Emergency Medicine | Admitting: Emergency Medicine

## 2020-07-12 ENCOUNTER — Encounter (HOSPITAL_COMMUNITY): Payer: Self-pay | Admitting: Emergency Medicine

## 2020-07-12 ENCOUNTER — Emergency Department (HOSPITAL_COMMUNITY): Payer: Medicare Other

## 2020-07-12 DIAGNOSIS — R079 Chest pain, unspecified: Secondary | ICD-10-CM | POA: Diagnosis not present

## 2020-07-12 DIAGNOSIS — I10 Essential (primary) hypertension: Secondary | ICD-10-CM | POA: Insufficient documentation

## 2020-07-12 DIAGNOSIS — Z955 Presence of coronary angioplasty implant and graft: Secondary | ICD-10-CM | POA: Insufficient documentation

## 2020-07-12 DIAGNOSIS — Z7984 Long term (current) use of oral hypoglycemic drugs: Secondary | ICD-10-CM | POA: Diagnosis not present

## 2020-07-12 DIAGNOSIS — E876 Hypokalemia: Secondary | ICD-10-CM

## 2020-07-12 DIAGNOSIS — Z7982 Long term (current) use of aspirin: Secondary | ICD-10-CM | POA: Diagnosis not present

## 2020-07-12 DIAGNOSIS — Z8616 Personal history of COVID-19: Secondary | ICD-10-CM | POA: Insufficient documentation

## 2020-07-12 DIAGNOSIS — Z79899 Other long term (current) drug therapy: Secondary | ICD-10-CM | POA: Diagnosis not present

## 2020-07-12 DIAGNOSIS — R0789 Other chest pain: Secondary | ICD-10-CM | POA: Diagnosis not present

## 2020-07-12 DIAGNOSIS — E119 Type 2 diabetes mellitus without complications: Secondary | ICD-10-CM | POA: Insufficient documentation

## 2020-07-12 LAB — CBC
HCT: 33 % — ABNORMAL LOW (ref 36.0–46.0)
Hemoglobin: 10.8 g/dL — ABNORMAL LOW (ref 12.0–15.0)
MCH: 30.5 pg (ref 26.0–34.0)
MCHC: 32.7 g/dL (ref 30.0–36.0)
MCV: 93.2 fL (ref 80.0–100.0)
Platelets: 270 10*3/uL (ref 150–400)
RBC: 3.54 MIL/uL — ABNORMAL LOW (ref 3.87–5.11)
RDW: 12.4 % (ref 11.5–15.5)
WBC: 6.3 10*3/uL (ref 4.0–10.5)
nRBC: 0 % (ref 0.0–0.2)

## 2020-07-12 LAB — BASIC METABOLIC PANEL
Anion gap: 10 (ref 5–15)
BUN: 16 mg/dL (ref 8–23)
CO2: 27 mmol/L (ref 22–32)
Calcium: 8.6 mg/dL — ABNORMAL LOW (ref 8.9–10.3)
Chloride: 100 mmol/L (ref 98–111)
Creatinine, Ser: 1.23 mg/dL — ABNORMAL HIGH (ref 0.44–1.00)
GFR, Estimated: 46 mL/min — ABNORMAL LOW (ref >60)
Glucose, Bld: 147 mg/dL — ABNORMAL HIGH (ref 70–99)
Potassium: 3.1 mmol/L — ABNORMAL LOW (ref 3.5–5.1)
Sodium: 137 mmol/L (ref 135–145)

## 2020-07-12 LAB — TROPONIN I (HIGH SENSITIVITY): Troponin I (High Sensitivity): 6 ng/L (ref <18)

## 2020-07-12 NOTE — ED Triage Notes (Signed)
Pt arrive POV from home for c/o cp for a week getting worse today, no sob, nausea or vomiting.

## 2020-07-13 DIAGNOSIS — R0789 Other chest pain: Secondary | ICD-10-CM | POA: Diagnosis not present

## 2020-07-13 LAB — TROPONIN I (HIGH SENSITIVITY): Troponin I (High Sensitivity): 7 ng/L (ref <18)

## 2020-07-13 MED ORDER — POTASSIUM CHLORIDE CRYS ER 20 MEQ PO TBCR: 20.0000 meq | EXTENDED_RELEASE_TABLET | Freq: Two times a day (BID) | ORAL | 0 refills | Status: DC

## 2020-07-13 MED ORDER — POTASSIUM CHLORIDE CRYS ER 20 MEQ PO TBCR
40.0000 meq | EXTENDED_RELEASE_TABLET | Freq: Once | ORAL | Status: AC
  Administered 2020-07-13: 40 meq via ORAL
  Filled 2020-07-13: qty 2

## 2020-07-13 NOTE — ED Provider Notes (Signed)
[facility] EMERGENCY DEPARTMENT Provider Note   CSN: [id number] Arrival date & time: 07/12/20  2048     History Chief Complaint  Patient presents with   Chest Pain    [name] is a 75 y.o. female.  HPI     Past Medical History:  Diagnosis Date   Diabetes mellitus without complication ([place])    GERD 09/04/2006   Qualifier: Diagnosis of  By: Suzie Portela     History of echocardiogram    a. 2D ECHO: 11/06/2013 EF 65%; no WMA. Mild TR. 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   Numbness 06/20/2012   Diabetes mellitus ([place]) 06/20/2012   CVA (cerebral infarction) 06/20/2012   Hypokalemia 06/20/2012   ARTHRALGIA 01/28/2009   CHEST PAIN UNSPECIFIED 11/26/2008   DEPRESSION 09/04/2006   Essential hypertension 09/04/2006   GERD 09/04/2006   MULTIPLE SITES, ARTHRITIS-ACUTE/CHRONIC 09/04/2006   LOW BACK PAIN, CHRONIC 09/04/2006    Past Surgical History:  Procedure Laterality Date   ABDOMINAL HYSTERECTOMY     ANTERIOR CERVICAL DECOMPRESSION/DISCECTOMY FUSION 4 LEVELS N/A 11/11/2019   Procedure: Cervical three-four Cervical four-five Cervical five-six Cervical six-seven  Anterior cervical decompression/discectomy/fusion;  Surgeon: Erline Levine, MD;  Location: [facility];  Service: Neurosurgery;  Laterality: N/A;   LEFT HEART CATHETERIZATION WITH CORONARY ANGIOGRAM N/A 11/05/2013   Procedure: LEFT HEART CATHETERIZATION WITH CORONARY ANGIOGRAM;  Surgeon: Troy Sine, MD;  Location: [facility] CATH LAB;  Service: Cardiovascular;  Laterality:  N/A;     OB History   No obstetric history on file.     History reviewed. No pertinent family history.  Social History   Tobacco Use   Smoking status: Never   Smokeless tobacco: Never  Vaping Use   Vaping Use: Never used  Substance Use Topics   Alcohol use: No   Drug use: No    Home Medications Prior to Admission medications   Medication Sig Start Date End Date Taking? Authorizing Provider  acetaminophen (TYLENOL) 325 MG tablet Take 2 tablets (650 mg total) by mouth every 6 (six) hours as needed for up to 30 doses for mild pain or moderate pain. 05/28/20   Wyvonnia Dusky, MD  albuterol (VENTOLIN HFA) 108 (90 Base) MCG/ACT inhaler Inhale 1-2 puffs into the lungs every 6 (six) hours as needed for wheezing or shortness of breath. 04/22/20   Vevelyn Francois, NP  amoxicillin (AMOXIL) 500 MG capsule Take 1 capsule (500 mg total) by mouth 2 (two) times daily. Patient not taking: Reported on 07/06/2020 06/29/20   Ripley Fraise, MD  aspirin EC 81 MG tablet Take 81 mg by mouth daily. Swallow whole.    [provider]  Blood Glucose Monitoring Suppl [facility] VERIO) w/Device KIT Use 1-4 times daily as needed/directed DX E11.9 04/25/20   Vevelyn Francois, NP  chlorthalidone (HYGROTON) 25 MG tablet Take 1 tablet (25 mg total) by mouth daily. 04/22/20   Vevelyn Francois, NP  cloNIDine (CATAPRES) 0.2 MG tablet Take 1 tablet (0.2 mg total) by mouth 2 (two) times daily. 12/10/19  Jaynee Eagles, PA-C  clopidogrel (PLAVIX) 75 MG tablet Take 1 tablet (75 mg total) by mouth daily. 04/22/20   Vevelyn Francois, NP  diphenhydramine-acetaminophen (TYLENOL PM) 25-500 MG TABS tablet Take 1 tablet by mouth at bedtime.    [provider]  gabapentin (NEURONTIN) 100 MG capsule Take 1 capsule (100 mg total) by mouth 3 (three) times daily. 04/18/20 07/06/20  Margarita Mail, PA-C  glipiZIDE (GLUCOTROL) 5 MG tablet Take 1 tablet (5 mg total) by mouth 2 (two) times daily before a meal. 04/22/20 04/22/21  Vevelyn Francois, NP  hydrALAZINE (APRESOLINE) 50 MG tablet Take 1 tablet (50 mg total) by mouth 2 (two) times daily. 04/22/20   Vevelyn Francois, NP  Lancets [facility] ULTRASOFT) lancets Use 1-4 times daily as needed/directed  DX E11.9 04/25/20   Vevelyn Francois, NP  lidocaine (LIDODERM) 5 % Place 1 patch onto the skin daily. Remove & Discard patch within 12 hours or as directed by MD Patient not taking: Reported on 07/06/2020 04/22/20 04/22/21  Vevelyn Francois, NP  losartan (COZAAR) 50 MG tablet Take 50 mg by mouth daily. 05/07/20   [provider]  metFORMIN (GLUCOPHAGE) 500 MG tablet Take 1.5 tablets (750 mg total) by mouth 2 (two) times daily with a meal. Patient taking differently: Take 1,000 mg by mouth 2 (two) times daily with a meal. 10/27/19 07/06/20  Veryl Speak, MD  methocarbamol (ROBAXIN) 500 MG tablet TAKE 1 TABLET BY MOUTH 4 TIMES DAILY. Patient taking differently: Take 500 mg by mouth in the morning and at bedtime. 05/09/20   Vevelyn Francois, NP  MYRBETRIQ 25 MG TB24 tablet Take 25 mg by mouth daily. 05/18/20   [provider]  Roma Schanz test strip Use 1-4 times daily as directed/needed   DX E11.9 04/25/20   Vevelyn Francois, NP  oxyCODONE (ROXICODONE) 5 MG immediate release tablet Take 1 tablet (5 mg total) by mouth every 6 (six) hours as needed for up to 4 doses for severe pain. 05/28/20   Wyvonnia Dusky, MD  potassium chloride (KLOR-CON) 10 MEQ tablet Take 1 tablet (10 mEq total) by mouth daily. 04/25/20   Noemi Chapel, MD  rosuvastatin (CRESTOR) 10 MG tablet Take 1 tablet (10 mg total) by mouth at bedtime. 04/22/20 04/22/21  Vevelyn Francois, NP    Allergies    Codeine, Ibuprofen, and Imdur [isosorbide nitrate]  Review of Systems   Review of Systems  Constitutional:  Negative for chills and fever.  HENT:  Negative for ear pain and sore throat.   Eyes:  Negative for pain and visual disturbance.  Respiratory:  Negative for cough and shortness of breath.   Cardiovascular:   Positive for chest pain. Negative for palpitations.  Gastrointestinal:  Negative for abdominal pain and vomiting.  Genitourinary:  Negative for dysuria and hematuria.  Musculoskeletal:  Negative for arthralgias and back pain.  Skin:  Negative for color change and rash.  Neurological:  Negative for seizures and syncope.  All other systems reviewed and are negative.  Physical Exam Updated Vital Signs BP (!) 159/62   Pulse 61   Temp 98 F (36.7 C) (Oral)   Resp (!) 26   SpO2 99%   Physical Exam Vitals and nursing note reviewed.  Constitutional:      General: She is not in acute distress.    Appearance: Normal appearance. She is well-developed. She is not ill-appearing.  HENT:     Head: Normocephalic and atraumatic.  Eyes:  Extraocular Movements: Extraocular movements intact.     Conjunctiva/sclera: Conjunctivae normal.     Pupils: Pupils are equal, round, and reactive to light.  Cardiovascular:     Rate and Rhythm: Normal rate and regular rhythm.     Heart sounds: No murmur heard. Pulmonary:     Effort: Pulmonary effort is normal. No respiratory distress.     Breath sounds: Normal breath sounds. No wheezing, rhonchi or rales.  Chest:     Chest wall: No tenderness.  Abdominal:     Palpations: Abdomen is soft.     Tenderness: There is no abdominal tenderness.  Musculoskeletal:     Cervical back: Neck supple.     Right lower leg: No edema.     Left lower leg: No edema.  Skin:    General: Skin is warm and dry.  Neurological:     General: No focal deficit present.     Mental Status: She is alert and oriented to person, place, and time.    ED Results / Procedures / Treatments   Labs (all labs ordered are listed, but only abnormal results are displayed) Labs Reviewed  BASIC METABOLIC PANEL - Abnormal; Notable for the following components:      Result Value   Potassium 3.1 (*)    Glucose, Bld 147 (*)    Creatinine, Ser 1.23 (*)    Calcium 8.6 (*)    GFR, Estimated  46 (*)    All other components within normal limits  CBC - Abnormal; Notable for the following components:   RBC 3.54 (*)    Hemoglobin 10.8 (*)    HCT 33.0 (*)    All other components within normal limits  TROPONIN I (HIGH SENSITIVITY)  TROPONIN I (HIGH SENSITIVITY)    EKG None  Radiology DG Chest 2 View  Result Date: 07/12/2020 CLINICAL DATA:  Chest pain. EXAM: CHEST - 2 VIEW COMPARISON:  Chest x-ray 07/05/2020, CT chest 11/27/2019 FINDINGS: The heart size and mediastinal contours are within normal limits. No focal consolidation. No pulmonary edema. No pleural effusion. No pneumothorax. No acute osseous abnormality. IMPRESSION: No active cardiopulmonary disease. Electronically Signed   By: Iven Finn M.D.   On: 07/12/2020 22:27    Procedures Procedures   Medications Ordered in ED Medications - No data to display  ED Course  I have reviewed the triage vital signs and the nursing notes.  Pertinent labs & imaging results that were available during my care of the patient were reviewed by me and considered in my medical decision making (see chart for details).    MDM Rules/Calculators/A&P                          Troponins x2 without any significant change.  Blood sugar up a little bit at 147.  Potassium slightly low at 3.1.  Patient is on potassium supplements.  Renal function baseline.  Patient followed by nurse practitioner at [facility] adult medicine.  Recommending patient follow back up with them.  Also recommending referral to cardiology for additional work-up.  Patient seen June 14 for the same type of chest pain.  Continues into this morning but she says it is very mild.  Work-up today for the chest pain without any acute findings.  No shortness of breath no tachycardia no concerns for pulmonary embolus.  No evidence of any significant acute cardiac event.  Patient is a as prescription for the potassium was April 4.  We will  give her another prescription just in case she is  confused about having that at home.  With her potassium being at 3.1 it would be important to supplement that.  We will give 1 dose of 40 mEq of potassium here. Final Clinical Impression(s) / ED Diagnoses Final diagnoses:  Atypical chest pain    Rx / DC Orders ED Discharge Orders     None        Fredia Sorrow, MD 07/13/20 [phone]

## 2020-07-13 NOTE — Discharge Instructions (Addendum)
Work-up today for the chest pain without any significant findings.  Make an appointment to follow-up with cardiology and your nurse practitioner.  It is important to follow-up with cardiology just so additional testing can be done.  Your potassium was slightly low today.  Take the potassium supplement prescription provided.  Also a dose given to you here today.  Take the potassium just as directed.

## 2020-07-18 ENCOUNTER — Other Ambulatory Visit: Payer: Self-pay

## 2020-07-18 ENCOUNTER — Emergency Department (HOSPITAL_COMMUNITY): Payer: Medicare Other

## 2020-07-18 ENCOUNTER — Emergency Department (HOSPITAL_COMMUNITY)
Admission: EM | Admit: 2020-07-18 | Discharge: 2020-07-19 | Disposition: A | Discharge status: Home / Self Care | Payer: Medicare Other | Attending: Emergency Medicine | Admitting: Emergency Medicine

## 2020-07-18 ENCOUNTER — Encounter (HOSPITAL_COMMUNITY): Payer: Self-pay | Admitting: Emergency Medicine

## 2020-07-18 DIAGNOSIS — Z7902 Long term (current) use of antithrombotics/antiplatelets: Secondary | ICD-10-CM | POA: Insufficient documentation

## 2020-07-18 DIAGNOSIS — Z8616 Personal history of COVID-19: Secondary | ICD-10-CM | POA: Diagnosis not present

## 2020-07-18 DIAGNOSIS — K029 Dental caries, unspecified: Secondary | ICD-10-CM

## 2020-07-18 DIAGNOSIS — Z79899 Other long term (current) drug therapy: Secondary | ICD-10-CM | POA: Insufficient documentation

## 2020-07-18 DIAGNOSIS — Z7984 Long term (current) use of oral hypoglycemic drugs: Secondary | ICD-10-CM | POA: Diagnosis not present

## 2020-07-18 DIAGNOSIS — E119 Type 2 diabetes mellitus without complications: Secondary | ICD-10-CM | POA: Insufficient documentation

## 2020-07-18 DIAGNOSIS — M542 Cervicalgia: Secondary | ICD-10-CM | POA: Diagnosis not present

## 2020-07-18 DIAGNOSIS — I773 Arterial fibromuscular dysplasia: Secondary | ICD-10-CM | POA: Insufficient documentation

## 2020-07-18 DIAGNOSIS — R519 Headache, unspecified: Secondary | ICD-10-CM | POA: Diagnosis not present

## 2020-07-18 DIAGNOSIS — I10 Essential (primary) hypertension: Secondary | ICD-10-CM | POA: Diagnosis not present

## 2020-07-18 DIAGNOSIS — Z7982 Long term (current) use of aspirin: Secondary | ICD-10-CM | POA: Diagnosis not present

## 2020-07-18 LAB — COMPREHENSIVE METABOLIC PANEL
ALT: 13 U/L (ref 0–44)
AST: 18 U/L (ref 15–41)
Albumin: 3.6 g/dL (ref 3.5–5.0)
Alkaline Phosphatase: 85 U/L (ref 38–126)
Anion gap: 12 (ref 5–15)
BUN: 16 mg/dL (ref 8–23)
CO2: 25 mmol/L (ref 22–32)
Calcium: 8.9 mg/dL (ref 8.9–10.3)
Chloride: 100 mmol/L (ref 98–111)
Creatinine, Ser: 1.17 mg/dL — ABNORMAL HIGH (ref 0.44–1.00)
GFR, Estimated: 49 mL/min — ABNORMAL LOW (ref >60)
Glucose, Bld: 103 mg/dL — ABNORMAL HIGH (ref 70–99)
Potassium: 3.8 mmol/L (ref 3.5–5.1)
Sodium: 137 mmol/L (ref 135–145)
Total Bilirubin: 0.3 mg/dL (ref 0.3–1.2)
Total Protein: 7.4 g/dL (ref 6.5–8.1)

## 2020-07-18 LAB — CBC WITH DIFFERENTIAL/PLATELET
Abs Immature Granulocytes: 0.02 10*3/uL (ref 0.00–0.07)
Basophils Absolute: 0 10*3/uL (ref 0.0–0.1)
Basophils Relative: 0 %
Eosinophils Absolute: 0.1 10*3/uL (ref 0.0–0.5)
Eosinophils Relative: 1 %
HCT: 33.4 % — ABNORMAL LOW (ref 36.0–46.0)
Hemoglobin: 10.8 g/dL — ABNORMAL LOW (ref 12.0–15.0)
Immature Granulocytes: 0 %
Lymphocytes Relative: 18 %
Lymphs Abs: 1.4 10*3/uL (ref 0.7–4.0)
MCH: 30.6 pg (ref 26.0–34.0)
MCHC: 32.3 g/dL (ref 30.0–36.0)
MCV: 94.6 fL (ref 80.0–100.0)
Monocytes Absolute: 0.7 10*3/uL (ref 0.1–1.0)
Monocytes Relative: 9 %
Neutro Abs: 5.8 10*3/uL (ref 1.7–7.7)
Neutrophils Relative %: 72 %
Platelets: 258 10*3/uL (ref 150–400)
RBC: 3.53 MIL/uL — ABNORMAL LOW (ref 3.87–5.11)
RDW: 12.3 % (ref 11.5–15.5)
WBC: 8 10*3/uL (ref 4.0–10.5)
nRBC: 0 % (ref 0.0–0.2)

## 2020-07-18 LAB — URINALYSIS, ROUTINE W REFLEX MICROSCOPIC
Bilirubin Urine: NEGATIVE
Glucose, UA: NEGATIVE mg/dL
Hgb urine dipstick: NEGATIVE
Ketones, ur: NEGATIVE mg/dL
Leukocytes,Ua: NEGATIVE
Nitrite: NEGATIVE
Protein, ur: NEGATIVE mg/dL
Specific Gravity, Urine: 1.016 (ref 1.005–1.030)
pH: 6 (ref 5.0–8.0)

## 2020-07-18 NOTE — ED Triage Notes (Signed)
Patient reports headache and posterior neck pain onset 4 days ago , denies injury , no blurred vision or nausea , patient added left upper dental pain this week .

## 2020-07-18 NOTE — ED Provider Notes (Signed)
Emergency Medicine Provider Triage Evaluation Note  [name] , a 75 y.o. female  was evaluated in triage.  Pt complains of acute on chronic neck pain, chronic foot pain and generally feeling unwell x 1 week.  Denies fever, chills.  Review of Systems  Positive: Headache, neck pain, foot pain Negative: Fever, chills, N/V  Physical Exam  BP (!) 185/70 (BP Location: Left Arm)   Pulse 63   Temp 98.4 F (36.9 C) (Oral)   Resp 18   SpO2 100%  Gen:   Awake, no distress   Resp:  Normal effort  MSK:   Moves extremities without difficulty  Other:  No midline tenderness to the cervical spine  Medical Decision Making  Medically screening exam initiated at 10:06 PM.  Appropriate orders placed.  [name] was informed that the remainder of the evaluation will be completed by another provider, this initial triage assessment does not replace that evaluation, and the importance of remaining in the ED until their evaluation is complete.  Acute on chronic pain.     [name], [name] 07/18/20 2213    Gerhard Munch, MD 07/18/20 [phone]

## 2020-07-19 ENCOUNTER — Encounter (HOSPITAL_COMMUNITY): Payer: Self-pay | Admitting: Emergency Medicine

## 2020-07-19 ENCOUNTER — Emergency Department (HOSPITAL_COMMUNITY): Payer: Medicare Other

## 2020-07-19 DIAGNOSIS — I773 Arterial fibromuscular dysplasia: Secondary | ICD-10-CM | POA: Diagnosis not present

## 2020-07-19 DIAGNOSIS — R519 Headache, unspecified: Secondary | ICD-10-CM | POA: Diagnosis not present

## 2020-07-19 MED ORDER — LIDOCAINE VISCOUS HCL 2 % MT SOLN
15.0000 mL | Freq: Once | OROMUCOSAL | Status: AC
  Administered 2020-07-19: 15 mL via OROMUCOSAL
  Filled 2020-07-19: qty 15

## 2020-07-19 MED ORDER — PENICILLIN V POTASSIUM 500 MG PO TABS: 500.0000 mg | ORAL_TABLET | Freq: Four times a day (QID) | ORAL | 0 refills | Status: AC

## 2020-07-19 MED ORDER — PENICILLIN V POTASSIUM 250 MG PO TABS
500.0000 mg | ORAL_TABLET | Freq: Once | ORAL | Status: AC
  Administered 2020-07-19: 500 mg via ORAL
  Filled 2020-07-19: qty 2

## 2020-07-19 MED ORDER — ACETAMINOPHEN 500 MG PO TABS
1000.0000 mg | ORAL_TABLET | Freq: Once | ORAL | Status: AC
  Administered 2020-07-19: 1000 mg via ORAL
  Filled 2020-07-19: qty 2

## 2020-07-19 MED ORDER — IOHEXOL 350 MG/ML SOLN
50.0000 mL | Freq: Once | INTRAVENOUS | Status: AC | PRN
  Administered 2020-07-19: 50 mL via INTRAVENOUS

## 2020-07-19 MED ORDER — IOHEXOL 350 MG/ML SOLN: 50.0000 mL | Freq: Once | INTRAVENOUS | Status: DC | PRN

## 2020-07-19 NOTE — ED Provider Notes (Signed)
[facility] EMERGENCY DEPARTMENT Provider Note   CSN: [id number] Arrival date & time: 07/18/20  2050     History Chief Complaint  Patient presents with   Headache / Neck Pain / Dental Pain     [name] is a 75 y.o. female.  The history is provided by the patient.  Dental Pain Location:  Upper Upper teeth location:  14/LU 1st molar and 13/LU 2nd bicuspid Quality:  Aching Severity:  Severe Onset quality:  Gradual Duration: days. Timing:  Constant Progression:  Worsening Chronicity:  New Context: crown fracture and dental caries   Previous work-up:  Dental exam Relieved by:  Nothing Worsened by:  Nothing Ineffective treatments:  None tried Associated symptoms: neck pain   Associated symptoms: no congestion, no difficulty swallowing, no drooling, no facial pain, no facial swelling, no fever, no gum swelling, no neck swelling, no oral bleeding, no oral lesions and no trismus   Associated symptoms comment:  Also has acute on chronic head and neck pain  Risk factors: lack of dental care   Risk factors: no alcohol problem   Also has head and neck pain.  No weakness no numbness, no changes in vision or speech.      Past Medical History:  Diagnosis Date   Diabetes mellitus without complication ([place])    GERD 09/04/2006   Qualifier: Diagnosis of  By: Suzie Portela     History of echocardiogram    a. 2D ECHO: 11/06/2013 EF 65%; no WMA. Mild TR. 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   Numbness 06/20/2012   Diabetes mellitus  ([place]) 06/20/2012   CVA (cerebral infarction) 06/20/2012   Hypokalemia 06/20/2012   ARTHRALGIA 01/28/2009   CHEST PAIN UNSPECIFIED 11/26/2008   DEPRESSION 09/04/2006   Essential hypertension 09/04/2006   GERD 09/04/2006   MULTIPLE SITES, ARTHRITIS-ACUTE/CHRONIC 09/04/2006   LOW BACK PAIN, CHRONIC 09/04/2006    Past Surgical History:  Procedure Laterality Date   ABDOMINAL HYSTERECTOMY     ANTERIOR CERVICAL DECOMPRESSION/DISCECTOMY FUSION 4 LEVELS N/A 11/11/2019   Procedure: Cervical three-four Cervical four-five Cervical five-six Cervical six-seven  Anterior cervical decompression/discectomy/fusion;  Surgeon: Erline Levine, MD;  Location: [place];  Service: Neurosurgery;  Laterality: N/A;   LEFT HEART CATHETERIZATION WITH CORONARY ANGIOGRAM N/A 11/05/2013   Procedure: LEFT HEART CATHETERIZATION WITH CORONARY ANGIOGRAM;  Surgeon: Troy Sine, MD;  Location: [facility] C[facility] LAB;  Service: Cardiovascular;  Laterality: N/A;     OB History   No obstetric history on file.     History reviewed. No pertinent family history.  Social History   Tobacco Use   Smoking status: Never   Smokeless tobacco: Never  Vaping Use   Vaping Use: Never used  Substance Use Topics   Alcohol use: No   Drug use: No    Home Medications Prior to Admission medications   Medication Sig Start Date End Date Taking? Authorizing Provider  acetaminophen (TYLENOL) 325 MG tablet Take 2 tablets (650 mg total) by mouth every 6 (six) hours as needed for up to 30  doses for mild pain or moderate pain. 05/28/20   Wyvonnia Dusky, MD  albuterol (VENTOLIN HFA) 108 (90 Base) MCG/ACT inhaler Inhale 1-2 puffs into the lungs every 6 (six) hours as needed for wheezing or shortness of breath. 04/22/20   Vevelyn Francois, NP  amoxicillin (AMOXIL) 500 MG capsule Take 1 capsule (500 mg total) by mouth 2 (two) times daily. Patient not taking: Reported on 07/06/2020 06/29/20   Ripley Fraise, MD  aspirin EC 81 MG tablet Take 81 mg by mouth  daily. Swallow whole.    [provider]  Blood Glucose Monitoring Suppl [facility] VERIO) w/Device KIT Use 1-4 times daily as needed/directed DX E11.9 04/25/20   Vevelyn Francois, NP  chlorthalidone (HYGROTON) 25 MG tablet Take 1 tablet (25 mg total) by mouth daily. 04/22/20   Vevelyn Francois, NP  cloNIDine (CATAPRES) 0.2 MG tablet Take 1 tablet (0.2 mg total) by mouth 2 (two) times daily. 12/10/19   Jaynee Eagles, PA-C  clopidogrel (PLAVIX) 75 MG tablet Take 1 tablet (75 mg total) by mouth daily. 04/22/20   Vevelyn Francois, NP  diphenhydramine-acetaminophen (TYLENOL PM) 25-500 MG TABS tablet Take 1 tablet by mouth at bedtime.    [provider]  gabapentin (NEURONTIN) 100 MG capsule Take 1 capsule (100 mg total) by mouth 3 (three) times daily. 04/18/20 07/06/20  Margarita Mail, PA-C  glipiZIDE (GLUCOTROL) 5 MG tablet Take 1 tablet (5 mg total) by mouth 2 (two) times daily before a meal. 04/22/20 04/22/21  Vevelyn Francois, NP  hydrALAZINE (APRESOLINE) 50 MG tablet Take 1 tablet (50 mg total) by mouth 2 (two) times daily. 04/22/20   Vevelyn Francois, NP  Lancets [facility] ULTRASOFT) lancets Use 1-4 times daily as needed/directed  DX E11.9 04/25/20   Vevelyn Francois, NP  lidocaine (LIDODERM) 5 % Place 1 patch onto the skin daily. Remove & Discard patch within 12 hours or as directed by MD Patient not taking: Reported on 07/06/2020 04/22/20 04/22/21  Vevelyn Francois, NP  losartan (COZAAR) 50 MG tablet Take 50 mg by mouth daily. 05/07/20   [provider]  metFORMIN (GLUCOPHAGE) 500 MG tablet Take 1.5 tablets (750 mg total) by mouth 2 (two) times daily with a meal. Patient taking differently: Take 1,000 mg by mouth 2 (two) times daily with a meal. 10/27/19 07/06/20  Veryl Speak, MD  methocarbamol (ROBAXIN) 500 MG tablet TAKE 1 TABLET BY MOUTH 4 TIMES DAILY. Patient taking differently: Take 500 mg by mouth in the morning and at bedtime. 05/09/20   Vevelyn Francois, NP  MYRBETRIQ 25 MG TB24 tablet Take 25  mg by mouth daily. 05/18/20   [provider]  Roma Schanz test strip Use 1-4 times daily as directed/needed   DX E11.9 04/25/20   Vevelyn Francois, NP  oxyCODONE (ROXICODONE) 5 MG immediate release tablet Take 1 tablet (5 mg total) by mouth every 6 (six) hours as needed for up to 4 doses for severe pain. 05/28/20   Wyvonnia Dusky, MD  potassium chloride (KLOR-CON) 10 MEQ tablet Take 1 tablet (10 mEq total) by mouth daily. 04/25/20   Noemi Chapel, MD  potassium chloride SA (KLOR-CON) 20 MEQ tablet Take 1 tablet (20 mEq total) by mouth 2 (two) times daily. 07/13/20   Fredia Sorrow, MD  rosuvastatin (CRESTOR) 10 MG tablet Take 1 tablet (10 mg total) by mouth at bedtime. 04/22/20 04/22/21  Vevelyn Francois, NP    Allergies    Codeine, Ibuprofen,  and Imdur [isosorbide nitrate]  Review of Systems   Review of Systems  Constitutional:  Negative for fever.  HENT:  Positive for dental problem. Negative for congestion, drooling, facial swelling and mouth sores.   Eyes:  Negative for redness.  Respiratory:  Negative for wheezing.   Cardiovascular:  Negative for leg swelling.  Gastrointestinal:  Negative for vomiting.  Genitourinary:  Negative for difficulty urinating.  Musculoskeletal:  Positive for neck pain. Negative for neck stiffness.  Skin:  Negative for rash.  Neurological:  Negative for dizziness and facial asymmetry.  Psychiatric/Behavioral:  Negative for agitation.   All other systems reviewed and are negative.  Physical Exam Updated Vital Signs BP (!) 186/65   Pulse 64   Temp 98.4 F (36.9 C) (Oral)   Resp 16   SpO2 99%   Physical Exam Vitals and nursing note reviewed.  Constitutional:      General: She is not in acute distress.    Appearance: Normal appearance.  HENT:     Head: Normocephalic and atraumatic.     Nose: Nose normal.     Mouth/Throat:     Mouth: Mucous membranes are moist.     Pharynx: Oropharynx is clear.     Comments: Dental caries  Eyes:      Conjunctiva/sclera: Conjunctivae normal.     Pupils: Pupils are equal, round, and reactive to light.  Neck:     Vascular: No carotid bruit.  Cardiovascular:     Rate and Rhythm: Normal rate and regular rhythm.     Pulses: Normal pulses.     Heart sounds: Normal heart sounds.  Pulmonary:     Effort: Pulmonary effort is normal.     Breath sounds: Normal breath sounds.  Abdominal:     General: Abdomen is flat. Bowel sounds are normal.     Palpations: Abdomen is soft.     Tenderness: There is no abdominal tenderness. There is no guarding.  Musculoskeletal:        General: Normal range of motion.     Cervical back: Normal range of motion and neck supple. No rigidity or tenderness.  Lymphadenopathy:     Cervical: No cervical adenopathy.  Skin:    General: Skin is warm and dry.     Capillary Refill: Capillary refill takes less than 2 seconds.  Neurological:     General: No focal deficit present.     Mental Status: She is alert and oriented to person, place, and time.  Psychiatric:        Mood and Affect: Mood normal.        Behavior: Behavior normal.    ED Results / Procedures / Treatments   Labs (all labs ordered are listed, but only abnormal results are displayed) Results for orders placed or performed during the hospital encounter of 07/18/20  CBC with Differential/Platelet  Result Value Ref Range   WBC 8.0 4.0 - 10.5 K/uL   RBC 3.53 (L) 3.87 - 5.11 MIL/uL   Hemoglobin 10.8 (L) 12.0 - 15.0 g/dL   HCT 33.4 (L) 36.0 - 46.0 %   MCV 94.6 80.0 - 100.0 fL   MCH 30.6 26.0 - 34.0 pg   MCHC 32.3 30.0 - 36.0 g/dL   RDW 12.3 11.5 - 15.5 %   Platelets 258 150 - 400 K/uL   nRBC 0.0 0.0 - 0.2 %   Neutrophils Relative % 72 %   Neutro Abs 5.8 1.7 - 7.7 K/uL   Lymphocytes Relative 18 %  Lymphs Abs 1.4 0.7 - 4.0 K/uL   Monocytes Relative 9 %   Monocytes Absolute 0.7 0.1 - 1.0 K/uL   Eosinophils Relative 1 %   Eosinophils Absolute 0.1 0.0 - 0.5 K/uL   Basophils Relative 0 %    Basophils Absolute 0.0 0.0 - 0.1 K/uL   Immature Granulocytes 0 %   Abs Immature Granulocytes 0.02 0.00 - 0.07 K/uL  Comprehensive metabolic panel  Result Value Ref Range   Sodium 137 135 - 145 mmol/L   Potassium 3.8 3.5 - 5.1 mmol/L   Chloride 100 98 - 111 mmol/L   CO2 25 22 - 32 mmol/L   Glucose, Bld 103 (H) 70 - 99 mg/dL   BUN 16 8 - 23 mg/dL   Creatinine, Ser 1.17 (H) 0.44 - 1.00 mg/dL   Calcium 8.9 8.9 - 10.3 mg/dL   Total Protein 7.4 6.5 - 8.1 g/dL   Albumin 3.6 3.5 - 5.0 g/dL   AST 18 15 - 41 U/L   ALT 13 0 - 44 U/L   Alkaline Phosphatase 85 38 - 126 U/L   Total Bilirubin 0.3 0.3 - 1.2 mg/dL   GFR, Estimated 49 (L) >60 mL/min   Anion gap 12 5 - 15  Urinalysis, Routine w reflex microscopic Urine, Clean Catch  Result Value Ref Range   Color, Urine YELLOW YELLOW   APPearance CLEAR CLEAR   Specific Gravity, Urine 1.016 1.005 - 1.030   pH 6.0 5.0 - 8.0   Glucose, UA NEGATIVE NEGATIVE mg/dL   Hgb urine dipstick NEGATIVE NEGATIVE   Bilirubin Urine NEGATIVE NEGATIVE   Ketones, ur NEGATIVE NEGATIVE mg/dL   Protein, ur NEGATIVE NEGATIVE mg/dL   Nitrite NEGATIVE NEGATIVE   Leukocytes,Ua NEGATIVE NEGATIVE   CT Angio Head W or Wo Contrast  Result Date: 07/19/2020 CLINICAL DATA:  75 year old female with posterior neck pain 4 days. Headache. Left upper dental pain. EXAM: CT ANGIOGRAPHY HEAD AND NECK TECHNIQUE: Multidetector CT imaging of the head and neck was performed using the standard protocol during bolus administration of intravenous contrast. Multiplanar CT image reconstructions and MIPs were obtained to evaluate the vascular anatomy. Carotid stenosis measurements (when applicable) are obtained utilizing NASCET criteria, using the distal internal carotid diameter as the denominator. CONTRAST:  44m OMNIPAQUE IOHEXOL 350 MG/ML SOLN COMPARISON:  CT head and cervical spine 10/13/2019 and earlier. MRI head, head and neck MRA 10/18/2012. FINDINGS: CT HEAD Brain: Cerebral volume is  within normal limits for age. No midline shift, ventriculomegaly, mass effect, evidence of mass lesion, intracranial hemorrhage or evidence of cortically based acute infarction. Gray-white matter differentiation appears stable since last year and within normal limits for age. Calvarium and skull base: No acute osseous abnormality identified. Paranasal sinuses: Chronic left maxillary sinus disease appears improved since September. Residual periosteal thickening there. Other Visualized paranasal sinuses and mastoids are clear. Orbits: Visualized orbits and scalp soft tissues are within normal limits. CTA NECK Skeleton: Carious dentition with multifocal maxillary dental periapical lucency especially at the anterior left maxillary bicuspid and posterior left maxillary molars. No regional soft tissue inflammation is evident. Carious right mandible anterior molar. Widespread cervical ACDF is new since September from C3 through C7. There does appear to be developing interbody arthrodesis at most levels, least apparent at C6-C7. No hardware loosening is evident. No acute osseous abnormality identified. Upper chest: Negative. Other neck: Sub 15 mm partially calcified left thyroid nodule Not clinically significant; no follow-up imaging recommended (ref: J Am Coll Radiol. 2015 Feb;12(2): 143-50).Mild motion  artifact at the pharynx. No acute soft tissue finding identified. Aortic arch: Mild bovine arch configuration. Mild to moderate calcified arch atherosclerosis. Right carotid system: Soft plaque at the right ICA origin does not result in stenosis with respect to the distal vessel beyond the bulb. Mildly beaded appearance of the ICA lumen at C1-C2 as seen on series 17, image 15. No stenosis to the skull base. Left carotid system: Bovine left CCA origin. Mild motion artifact just below the bifurcation. Mild plaque at the left ICA origin without stenosis. Subtle beaded appearance of the vessel at C1-C2 on series 17, image 25.  No stenosis to the skull base. Vertebral arteries: Proximal right subclavian artery and right vertebral artery origin are normal. The right vertebral is patent to the skull base without stenosis. Mild plaque in the proximal left subclavian artery without stenosis. Normal left vertebral artery origin. Codominant left vertebral artery is patent to the skull base without stenosis. CTA HEAD Posterior circulation: Patent distal vertebral arteries, the left is mildly dominant. Normal right PICA origin. No distal vertebral or basilar stenosis. The left AICA appears dominant. Normal SCA and PCA origins. Posterior communicating arteries are diminutive or absent. Mild left P1 segment irregularity without significant stenosis. Bilateral PCA branches are within normal limits. Anterior circulation: Both ICA siphons are patent. Mild calcified plaque, greater on the left, without siphon stenosis. Patent carotid termini. MCA and ACA origins appear stable since 2014 and normal. Chronically tortuous left A1 again noted. Normal anterior communicating artery with median artery of the corpus callosum again noted (normal variant). Bilateral ACA branches are within normal limits. Left MCA M1 segment bifurcates early without stenosis. Left MCA branches appear stable and within normal limits. Right MCA M1 segment, bifurcation, and right MCA branches appear stable and within normal limits. Venous sinuses: Patent. Anatomic variants: Mildly bovine left CCA origin. Mildly dominant left vertebral V4 segment. Median artery of the corpus callosum. Review of the MIP images confirms the above findings IMPRESSION: 1. 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 But Head and neck atherosclerosis is mild for age, with no significant stenosis or other arterial abnormality identified. 2. Carious dentition. Multifocal left maxillary dental periapical lucency. No soft tissue inflammation identified. 3. No acute  intracranial abnormality. Normal for age CT appearance of the brain. 4. Cervical spine ACDF since September from C3 through C7 with developing interbody arthrodesis. 5. Aortic Atherosclerosis (ICD10-I70.0). Electronically Signed   By: Genevie Ann M.D.   On: 07/19/2020 04:40   DG Chest 2 View  Result Date: 07/12/2020 CLINICAL DATA:  Chest pain. EXAM: CHEST - 2 VIEW COMPARISON:  Chest x-ray 07/05/2020, CT chest 11/27/2019 FINDINGS: The heart size and mediastinal contours are within normal limits. No focal consolidation. No pulmonary edema. No pleural effusion. No pneumothorax. No acute osseous abnormality. IMPRESSION: No active cardiopulmonary disease. Electronically Signed   By: Iven Finn M.D.   On: 07/12/2020 22:27   DG Chest 2 View  Result Date: 07/05/2020 CLINICAL DATA:  Chest pain EXAM: CHEST - 2 VIEW COMPARISON:  06/22/2020 FINDINGS: Surgical hardware in the cervical spine. No focal opacity or pleural effusion. Mild bronchitic changes. Normal cardiomediastinal silhouette. No pneumothorax. IMPRESSION: No active cardiopulmonary disease. Electronically Signed   By: Donavan Foil M.D.   On: 07/05/2020 21:41   DG Chest 2 View  Result Date: 06/22/2020 CLINICAL DATA:  Left-sided chest pain for 1 week EXAM: CHEST - 2 VIEW COMPARISON:  05/24/2020 FINDINGS: The heart size and mediastinal contours  are within normal limits. Both lungs are clear. The visualized skeletal structures are unremarkable. IMPRESSION: No active cardiopulmonary disease. Electronically Signed   By: Randa Ngo M.D.   On: 06/22/2020 21:28   DG Cervical Spine Complete  Result Date: 07/18/2020 CLINICAL DATA:  Neck pain EXAM: CERVICAL SPINE - COMPLETE 4+ VIEW COMPARISON:  11/11/2019 FINDINGS: Prior anterior fusion from C3 to C7. No hardware complicating feature. Normal alignment. No fracture. Prevertebral soft tissues normal. Diffuse degenerative facet disease bilaterally. IMPRESSION: Prior anterior fusion C3-C7. No acute bony  abnormality. Electronically Signed   By: Rolm Baptise M.D.   On: 07/18/2020 23:02   CT Angio Neck W and/or Wo Contrast  Result Date: 07/19/2020 CLINICAL DATA:  75 year old female with posterior neck pain 4 days. Headache. Left upper dental pain. EXAM: CT ANGIOGRAPHY HEAD AND NECK TECHNIQUE: Multidetector CT imaging of the head and neck was performed using the standard protocol during bolus administration of intravenous contrast. Multiplanar CT image reconstructions and MIPs were obtained to evaluate the vascular anatomy. Carotid stenosis measurements (when applicable) are obtained utilizing NASCET criteria, using the distal internal carotid diameter as the denominator. CONTRAST:  64m OMNIPAQUE IOHEXOL 350 MG/ML SOLN COMPARISON:  CT head and cervical spine 10/13/2019 and earlier. MRI head, head and neck MRA 10/18/2012. FINDINGS: CT HEAD Brain: Cerebral volume is within normal limits for age. No midline shift, ventriculomegaly, mass effect, evidence of mass lesion, intracranial hemorrhage or evidence of cortically based acute infarction. Gray-white matter differentiation appears stable since last year and within normal limits for age. Calvarium and skull base: No acute osseous abnormality identified. Paranasal sinuses: Chronic left maxillary sinus disease appears improved since September. Residual periosteal thickening there. Other Visualized paranasal sinuses and mastoids are clear. Orbits: Visualized orbits and scalp soft tissues are within normal limits. CTA NECK Skeleton: Carious dentition with multifocal maxillary dental periapical lucency especially at the anterior left maxillary bicuspid and posterior left maxillary molars. No regional soft tissue inflammation is evident. Carious right mandible anterior molar. Widespread cervical ACDF is new since September from C3 through C7. There does appear to be developing interbody arthrodesis at most levels, least apparent at C6-C7. No hardware loosening is evident.  No acute osseous abnormality identified. Upper chest: Negative. Other neck: Sub 15 mm partially calcified left thyroid nodule Not clinically significant; no follow-up imaging recommended (ref: J Am Coll Radiol. 2015 Feb;12(2): 143-50).Mild motion artifact at the pharynx. No acute soft tissue finding identified. Aortic arch: Mild bovine arch configuration. Mild to moderate calcified arch atherosclerosis. Right carotid system: Soft plaque at the right ICA origin does not result in stenosis with respect to the distal vessel beyond the bulb. Mildly beaded appearance of the ICA lumen at C1-C2 as seen on series 17, image 15. No stenosis to the skull base. Left carotid system: Bovine left CCA origin. Mild motion artifact just below the bifurcation. Mild plaque at the left ICA origin without stenosis. Subtle beaded appearance of the vessel at C1-C2 on series 17, image 25. No stenosis to the skull base. Vertebral arteries: Proximal right subclavian artery and right vertebral artery origin are normal. The right vertebral is patent to the skull base without stenosis. Mild plaque in the proximal left subclavian artery without stenosis. Normal left vertebral artery origin. Codominant left vertebral artery is patent to the skull base without stenosis. CTA HEAD Posterior circulation: Patent distal vertebral arteries, the left is mildly dominant. Normal right PICA origin. No distal vertebral or basilar stenosis. The left AICA appears dominant. Normal SCA  and PCA origins. Posterior communicating arteries are diminutive or absent. Mild left P1 segment irregularity without significant stenosis. Bilateral PCA branches are within normal limits. Anterior circulation: Both ICA siphons are patent. Mild calcified plaque, greater on the left, without siphon stenosis. Patent carotid termini. MCA and ACA origins appear stable since 2014 and normal. Chronically tortuous left A1 again noted. Normal anterior communicating artery with median  artery of the corpus callosum again noted (normal variant). Bilateral ACA branches are within normal limits. Left MCA M1 segment bifurcates early without stenosis. Left MCA branches appear stable and within normal limits. Right MCA M1 segment, bifurcation, and right MCA branches appear stable and within normal limits. Venous sinuses: Patent. Anatomic variants: Mildly bovine left CCA origin. Mildly dominant left vertebral V4 segment. Median artery of the corpus callosum. Review of the MIP images confirms the above findings IMPRESSION: 1. 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 But Head and neck atherosclerosis is mild for age, with no significant stenosis or other arterial abnormality identified. 2. Carious dentition. Multifocal left maxillary dental periapical lucency. No soft tissue inflammation identified. 3. No acute intracranial abnormality. Normal for age CT appearance of the brain. 4. Cervical spine ACDF since September from C3 through C7 with developing interbody arthrodesis. 5. Aortic Atherosclerosis (ICD10-I70.0). Electronically Signed   By: Genevie Ann M.D.   On: 07/19/2020 04:40     Radiology CT Angio Head W or Wo Contrast  Result Date: 07/19/2020 CLINICAL DATA:  75 year old female with posterior neck pain 4 days. Headache. Left upper dental pain. EXAM: CT ANGIOGRAPHY HEAD AND NECK TECHNIQUE: Multidetector CT imaging of the head and neck was performed using the standard protocol during bolus administration of intravenous contrast. Multiplanar CT image reconstructions and MIPs were obtained to evaluate the vascular anatomy. Carotid stenosis measurements (when applicable) are obtained utilizing NASCET criteria, using the distal internal carotid diameter as the denominator. CONTRAST:  13m OMNIPAQUE IOHEXOL 350 MG/ML SOLN COMPARISON:  CT head and cervical spine 10/13/2019 and earlier. MRI head, head and neck MRA 10/18/2012. FINDINGS: CT HEAD Brain: Cerebral  volume is within normal limits for age. No midline shift, ventriculomegaly, mass effect, evidence of mass lesion, intracranial hemorrhage or evidence of cortically based acute infarction. Gray-white matter differentiation appears stable since last year and within normal limits for age. Calvarium and skull base: No acute osseous abnormality identified. Paranasal sinuses: Chronic left maxillary sinus disease appears improved since September. Residual periosteal thickening there. Other Visualized paranasal sinuses and mastoids are clear. Orbits: Visualized orbits and scalp soft tissues are within normal limits. CTA NECK Skeleton: Carious dentition with multifocal maxillary dental periapical lucency especially at the anterior left maxillary bicuspid and posterior left maxillary molars. No regional soft tissue inflammation is evident. Carious right mandible anterior molar. Widespread cervical ACDF is new since September from C3 through C7. There does appear to be developing interbody arthrodesis at most levels, least apparent at C6-C7. No hardware loosening is evident. No acute osseous abnormality identified. Upper chest: Negative. Other neck: Sub 15 mm partially calcified left thyroid nodule Not clinically significant; no follow-up imaging recommended (ref: J Am Coll Radiol. 2015 Feb;12(2): 143-50).Mild motion artifact at the pharynx. No acute soft tissue finding identified. Aortic arch: Mild bovine arch configuration. Mild to moderate calcified arch atherosclerosis. Right carotid system: Soft plaque at the right ICA origin does not result in stenosis with respect to the distal vessel beyond the bulb. Mildly beaded appearance of the ICA lumen at C1-C2 as seen  on series 17, image 15. No stenosis to the skull base. Left carotid system: Bovine left CCA origin. Mild motion artifact just below the bifurcation. Mild plaque at the left ICA origin without stenosis. Subtle beaded appearance of the vessel at C1-C2 on series 17,  image 25. No stenosis to the skull base. Vertebral arteries: Proximal right subclavian artery and right vertebral artery origin are normal. The right vertebral is patent to the skull base without stenosis. Mild plaque in the proximal left subclavian artery without stenosis. Normal left vertebral artery origin. Codominant left vertebral artery is patent to the skull base without stenosis. CTA HEAD Posterior circulation: Patent distal vertebral arteries, the left is mildly dominant. Normal right PICA origin. No distal vertebral or basilar stenosis. The left AICA appears dominant. Normal SCA and PCA origins. Posterior communicating arteries are diminutive or absent. Mild left P1 segment irregularity without significant stenosis. Bilateral PCA branches are within normal limits. Anterior circulation: Both ICA siphons are patent. Mild calcified plaque, greater on the left, without siphon stenosis. Patent carotid termini. MCA and ACA origins appear stable since 2014 and normal. Chronically tortuous left A1 again noted. Normal anterior communicating artery with median artery of the corpus callosum again noted (normal variant). Bilateral ACA branches are within normal limits. Left MCA M1 segment bifurcates early without stenosis. Left MCA branches appear stable and within normal limits. Right MCA M1 segment, bifurcation, and right MCA branches appear stable and within normal limits. Venous sinuses: Patent. Anatomic variants: Mildly bovine left CCA origin. Mildly dominant left vertebral V4 segment. Median artery of the corpus callosum. Review of the MIP images confirms the above findings IMPRESSION: 1. 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 But Head and neck atherosclerosis is mild for age, with no significant stenosis or other arterial abnormality identified. 2. Carious dentition. Multifocal left maxillary dental periapical lucency. No soft tissue inflammation identified. 3. No  acute intracranial abnormality. Normal for age CT appearance of the brain. 4. Cervical spine ACDF since September from C3 through C7 with developing interbody arthrodesis. 5. Aortic Atherosclerosis (ICD10-I70.0). Electronically Signed   By: Genevie Ann M.D.   On: 07/19/2020 04:40   DG Cervical Spine Complete  Result Date: 07/18/2020 CLINICAL DATA:  Neck pain EXAM: CERVICAL SPINE - COMPLETE 4+ VIEW COMPARISON:  11/11/2019 FINDINGS: Prior anterior fusion from C3 to C7. No hardware complicating feature. Normal alignment. No fracture. Prevertebral soft tissues normal. Diffuse degenerative facet disease bilaterally. IMPRESSION: Prior anterior fusion C3-C7. No acute bony abnormality. Electronically Signed   By: Rolm Baptise M.D.   On: 07/18/2020 23:02   CT Angio Neck W and/or Wo Contrast  Result Date: 07/19/2020 CLINICAL DATA:  75 year old female with posterior neck pain 4 days. Headache. Left upper dental pain. EXAM: CT ANGIOGRAPHY HEAD AND NECK TECHNIQUE: Multidetector CT imaging of the head and neck was performed using the standard protocol during bolus administration of intravenous contrast. Multiplanar CT image reconstructions and MIPs were obtained to evaluate the vascular anatomy. Carotid stenosis measurements (when applicable) are obtained utilizing NASCET criteria, using the distal internal carotid diameter as the denominator. CONTRAST:  33m OMNIPAQUE IOHEXOL 350 MG/ML SOLN COMPARISON:  CT head and cervical spine 10/13/2019 and earlier. MRI head, head and neck MRA 10/18/2012. FINDINGS: CT HEAD Brain: Cerebral volume is within normal limits for age. No midline shift, ventriculomegaly, mass effect, evidence of mass lesion, intracranial hemorrhage or evidence of cortically based acute infarction. Gray-white matter differentiation appears stable since last year and within  normal limits for age. Calvarium and skull base: No acute osseous abnormality identified. 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 Residual periosteal thickening there. Other Visualized paranasal sinuses and mastoids are clear. Orbits: Visualized orbits and scalp soft tissues are within normal limits. CTA NECK Skeleton: Carious dentition with multifocal maxillary dental periapical lucency especially at the anterior left maxillary bicuspid and posterior left maxillary molars. No regional soft tissue inflammation is evident. Carious right mandible anterior molar. Widespread cervical ACDF is new since September from C3 through C7. There does appear to be developing interbody arthrodesis at most levels, least apparent at C6-C7. No hardware loosening is evident. No acute osseous abnormality identified. Upper chest: Negative. Other neck: Sub 15 mm partially calcified left thyroid nodule Not clinically significant; no follow-up imaging recommended (ref: J Am Coll Radiol. 2015 Feb;12(2): 143-50).Mild motion artifact at the pharynx. No acute soft tissue finding identified. Aortic arch: Mild bovine arch configuration. Mild to moderate calcified arch atherosclerosis. Right carotid system: Soft plaque at the right ICA origin does not result in stenosis with respect to the distal vessel beyond the bulb. Mildly beaded appearance of the ICA lumen at C1-C2 as seen on series 17, image 15. No stenosis to the skull base. Left carotid system: Bovine left CCA origin. Mild motion artifact just below the bifurcation. Mild plaque at the left ICA origin without stenosis. Subtle beaded appearance of the vessel at C1-C2 on series 17, image 25. No stenosis to the skull base. Vertebral arteries: Proximal right subclavian artery and right vertebral artery origin are normal. The right vertebral is patent to the skull base without stenosis. Mild plaque in the proximal left subclavian artery without stenosis. Normal left vertebral artery origin. Codominant left vertebral artery is patent to the skull base without stenosis. CTA HEAD  Posterior circulation: Patent distal vertebral arteries, the left is mildly dominant. Normal right PICA origin. No distal vertebral or basilar stenosis. The left AICA appears dominant. Normal SCA and PCA origins. Posterior communicating arteries are diminutive or absent. Mild left P1 segment irregularity without significant stenosis. Bilateral PCA branches are within normal limits. Anterior circulation: Both ICA siphons are patent. Mild calcified plaque, greater on the left, without siphon stenosis. Patent carotid termini. MCA and ACA origins appear stable since 2014 and normal. Chronically tortuous left A1 again noted. Normal anterior communicating artery with median artery of the corpus callosum again noted (normal variant). Bilateral ACA branches are within normal limits. Left MCA M1 segment bifurcates early without stenosis. Left MCA branches appear stable and within normal limits. Right MCA M1 segment, bifurcation, and right MCA branches appear stable and within normal limits. Venous sinuses: Patent. Anatomic variants: Mildly bovine left CCA origin. Mildly dominant left vertebral V4 segment. Median artery of the corpus callosum. Review of the MIP images confirms the above findings IMPRESSION: 1. 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 But Head and neck atherosclerosis is mild for age, with no significant stenosis or other arterial abnormality identified. 2. Carious dentition. Multifocal left maxillary dental periapical lucency. No soft tissue inflammation identified. 3. No acute intracranial abnormality. Normal for age CT appearance of the brain. 4. Cervical spine ACDF since September from C3 through C7 with developing interbody arthrodesis. 5. Aortic Atherosclerosis (ICD10-I70.0). Electronically Signed   By: Genevie Ann M.D.   On: 07/19/2020 04:40    Procedures Procedures   Medications Ordered in ED Medications  iohexol (OMNIPAQUE) 350 MG/ML injection 50 mL (has  no  administration in time range)  acetaminophen (TYLENOL) tablet 1,000 mg (1,000 mg Oral Given 07/19/20 0244)  lidocaine (XYLOCAINE) 2 % viscous mouth solution 15 mL (15 mLs Mouth/Throat Given 07/19/20 0244)  penicillin v potassium (VEETID) tablet 500 mg (500 mg Oral Given 07/19/20 0244)  iohexol (OMNIPAQUE) 350 MG/ML injection 50 mL (50 mLs Intravenous Contrast Given 07/19/20 0349)    ED Course  I have reviewed the triage vital signs and the nursing notes.  Pertinent labs & imaging results that were available during my care of the patient were reviewed by me and considered in my medical decision making (see chart for details).  505 case d/w Dr. Curly [place] of neurology. Nothing to do acutely.  Have patient follow up with her PMD.  No indication for aspirin or anticoagulation at this time.    Patient informed of fibromuscular dysplasia seen on CT scan and that it could put her at increased risk for aneurysms and dissections and need for close follow up with PMD. Patient verbalizes understanding and agrees to follow up.  This was also printed on her discharge papers so she can take them to her doctor.  Will also given PCN for dental caries and dental resource guide as she will need dental care.    [name] was evaluated in Emergency Department on 07/19/2020 for the symptoms described in the history of present illness. She was evaluated in the context of the global COVID-19 pandemic, which necessitated consideration that the patient might be at risk for infection with the SARS-CoV-2 virus that causes COVID-19. Institutional protocols and algorithms that pertain to the evaluation of patients at risk for COVID-19 are in a state of rapid change based on information released by regulatory bodies including the CDC and federal and state organizations. These policies and algorithms were followed during the patient's care in the ED.     Final Clinical Impression(s) / ED Diagnoses Final diagnoses:   Fibromuscular dysplasia of carotid artery (HCC)    Return for intractable cough, coughing up blood, fevers > 100.4 unrelieved by medication, shortness of breath, intractable vomiting, chest pain, shortness of breath, weakness, numbness, changes in speech, facial asymmetry, abdominal pain, passing out, Inability to tolerate liquids or food, cough, altered mental status or any concerns. No signs of systemic illness or infection. The patient is nontoxic-appearing on exam and vital signs are within normal limits. I have reviewed the triage vital signs and the nursing notes. Pertinent labs & imaging results that were available during my care of the patient were reviewed by me and considered in my medical decision making (see chart for details). After history, exam, and medical workup I feel the patient has been appropriately medically screened and is safe for discharge home. Pertinent diagnoses were discussed with the patient. Patient was given return precautions.  Rx / DC Orders ED Discharge Orders     None        Adylene Dlugosz, MD 07/19/20 1610

## 2020-07-20 ENCOUNTER — Other Ambulatory Visit: Payer: Self-pay

## 2020-07-20 ENCOUNTER — Emergency Department (HOSPITAL_COMMUNITY)
Admission: EM | Admit: 2020-07-20 | Discharge: 2020-07-21 | Disposition: A | Discharge status: Home / Self Care | Payer: Medicare Other | Attending: Emergency Medicine | Admitting: Emergency Medicine

## 2020-07-20 DIAGNOSIS — M79605 Pain in left leg: Secondary | ICD-10-CM | POA: Diagnosis not present

## 2020-07-20 DIAGNOSIS — R519 Headache, unspecified: Secondary | ICD-10-CM | POA: Diagnosis not present

## 2020-07-20 DIAGNOSIS — Z7984 Long term (current) use of oral hypoglycemic drugs: Secondary | ICD-10-CM | POA: Diagnosis not present

## 2020-07-20 DIAGNOSIS — Z7902 Long term (current) use of antithrombotics/antiplatelets: Secondary | ICD-10-CM | POA: Diagnosis not present

## 2020-07-20 DIAGNOSIS — Z8616 Personal history of COVID-19: Secondary | ICD-10-CM | POA: Insufficient documentation

## 2020-07-20 DIAGNOSIS — I10 Essential (primary) hypertension: Secondary | ICD-10-CM

## 2020-07-20 DIAGNOSIS — M79604 Pain in right leg: Secondary | ICD-10-CM | POA: Insufficient documentation

## 2020-07-20 DIAGNOSIS — Z7982 Long term (current) use of aspirin: Secondary | ICD-10-CM | POA: Diagnosis not present

## 2020-07-20 DIAGNOSIS — M79671 Pain in right foot: Secondary | ICD-10-CM | POA: Diagnosis not present

## 2020-07-20 DIAGNOSIS — M79672 Pain in left foot: Secondary | ICD-10-CM | POA: Diagnosis not present

## 2020-07-20 DIAGNOSIS — Z79899 Other long term (current) drug therapy: Secondary | ICD-10-CM | POA: Diagnosis not present

## 2020-07-20 DIAGNOSIS — E119 Type 2 diabetes mellitus without complications: Secondary | ICD-10-CM | POA: Insufficient documentation

## 2020-07-20 NOTE — ED Triage Notes (Signed)
Pt seen multiple times this weak for increase HA and posterior neck pain for the past week, denies any injury, no nausea or vomiting, no dizziness.

## 2020-07-20 NOTE — ED Provider Notes (Signed)
Emergency Medicine Provider Triage Evaluation Note  [name] , a 75 y.o. female  was evaluated in triage.  Pt complains of head, neck pain dental pain.  Her symptoms have been ongoing.  She was seen and discharged yesterday morning for the same thing.  She denies any new symptoms.  No fevers.  Review of Systems  Positive: Headache, neck pain,  Negative: N/V/D, dizziness  Physical Exam  BP (!) 202/73 (BP Location: Left Arm)   Pulse 66   Temp 98.7 F (37.1 C) (Oral)   Resp 16   SpO2 100%  Gen:   Awake, no distress   Resp:  Normal effort  MSK:   Moves extremities without difficulty  Other:  Patient is awake and alert.  Speech is nonslurred.  Answers questions appropriately.  Medical Decision Making  Medically screening exam initiated at 9:19 PM.  Appropriate orders placed.  [name] was informed that the remainder of the evaluation will be completed by another provider, this initial triage assessment does not replace that evaluation, and the importance of remaining in the ED until their evaluation is complete.  Note: Portions of this report may have been transcribed using voice recognition software. Every effort was made to ensure accuracy; however, inadvertent computerized transcription errors may be present    Norman Clay 07/20/20 2121    Melene Plan, DO 07/20/20 2131

## 2020-07-21 ENCOUNTER — Encounter (HOSPITAL_COMMUNITY): Payer: Self-pay

## 2020-07-21 ENCOUNTER — Emergency Department (HOSPITAL_COMMUNITY)
Admission: EM | Admit: 2020-07-21 | Discharge: 2020-07-21 | Disposition: A | Discharge status: Home / Self Care | Payer: Medicare Other | Source: Home / Self Care | Attending: Emergency Medicine | Admitting: Emergency Medicine

## 2020-07-21 ENCOUNTER — Encounter (HOSPITAL_COMMUNITY): Payer: Self-pay | Admitting: Emergency Medicine

## 2020-07-21 ENCOUNTER — Other Ambulatory Visit: Payer: Self-pay

## 2020-07-21 DIAGNOSIS — G4489 Other headache syndrome: Secondary | ICD-10-CM

## 2020-07-21 DIAGNOSIS — E119 Type 2 diabetes mellitus without complications: Secondary | ICD-10-CM | POA: Insufficient documentation

## 2020-07-21 DIAGNOSIS — Z7902 Long term (current) use of antithrombotics/antiplatelets: Secondary | ICD-10-CM | POA: Insufficient documentation

## 2020-07-21 DIAGNOSIS — Z955 Presence of coronary angioplasty implant and graft: Secondary | ICD-10-CM | POA: Insufficient documentation

## 2020-07-21 DIAGNOSIS — R519 Headache, unspecified: Secondary | ICD-10-CM | POA: Insufficient documentation

## 2020-07-21 DIAGNOSIS — Z7984 Long term (current) use of oral hypoglycemic drugs: Secondary | ICD-10-CM | POA: Insufficient documentation

## 2020-07-21 DIAGNOSIS — I10 Essential (primary) hypertension: Secondary | ICD-10-CM | POA: Insufficient documentation

## 2020-07-21 DIAGNOSIS — M79672 Pain in left foot: Secondary | ICD-10-CM

## 2020-07-21 DIAGNOSIS — M79671 Pain in right foot: Secondary | ICD-10-CM | POA: Diagnosis not present

## 2020-07-21 DIAGNOSIS — Z8616 Personal history of COVID-19: Secondary | ICD-10-CM | POA: Insufficient documentation

## 2020-07-21 DIAGNOSIS — Z7982 Long term (current) use of aspirin: Secondary | ICD-10-CM | POA: Insufficient documentation

## 2020-07-21 MED ORDER — ACETAMINOPHEN 500 MG PO TABS
1000.0000 mg | ORAL_TABLET | Freq: Once | ORAL | Status: AC
  Administered 2020-07-21: 1000 mg via ORAL
  Filled 2020-07-21: qty 2

## 2020-07-21 MED ORDER — ACETAMINOPHEN 325 MG PO TABS
650.0000 mg | ORAL_TABLET | Freq: Once | ORAL | Status: AC
  Administered 2020-07-21: 650 mg via ORAL
  Filled 2020-07-21: qty 2

## 2020-07-21 MED ORDER — AMLODIPINE BESYLATE 5 MG PO TABS
10.0000 mg | ORAL_TABLET | Freq: Once | ORAL | Status: AC
  Administered 2020-07-21: 10 mg via ORAL
  Filled 2020-07-21: qty 2

## 2020-07-21 MED ORDER — LIDOCAINE 5 % EX PTCH
1.0000 | MEDICATED_PATCH | CUTANEOUS | 0 refills | Status: DC
Start: 2020-07-21 — End: 2020-10-05

## 2020-07-21 NOTE — ED Provider Notes (Signed)
[facility] EMERGENCY DEPARTMENT Provider Note   CSN: [id number] Arrival date & time: 07/20/20  2053     History Chief Complaint  Patient presents with   Headache    Trannie Bardales is a 75 y.o. female.  The history is provided by the patient.  Headache Pain location:  Generalized Quality:  Dull Radiates to:  Does not radiate (also has left upper dental pain) Severity currently:  7/10 Severity at highest:  10/10 Onset quality:  Gradual Duration: head and neck pain are chronic dental pain has been going on for about a week. Timing:  Constant Progression:  Unchanged Chronicity:  Chronic Context: not activity, not exposure to bright light, not caffeine and not coughing   Relieved by:  Nothing Worsened by:  Nothing Ineffective treatments:  None tried Associated symptoms: no abdominal pain, no back pain, no blurred vision, no congestion, no cough, no diarrhea, no dizziness, no drainage, no ear pain, no eye pain, no fatigue, no fever, no focal weakness, no hearing loss, no loss of balance, no myalgias, no nausea, no near-syncope, no neck stiffness, no numbness, no paresthesias, no photophobia, no seizures, no sinus pressure, no sore throat, no swollen glands, no syncope, no tingling, no URI, no visual change, no vomiting and no weakness   Risk factors: no anger   Seen for same multiple times this week.  Also has leg pain.  Did not follow up with PMD or dentistry as directed.      Past Medical History:  Diagnosis Date   Diabetes mellitus without complication ([place])    GERD 09/04/2006   Qualifier: Diagnosis of  By: Suzie Portela     History of echocardiogram    a. 2D ECHO: 11/06/2013 EF 65%; no WMA. Mild TR. 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   Numbness 06/20/2012   Diabetes mellitus ([place]) 06/20/2012   CVA (cerebral infarction) 06/20/2012   Hypokalemia 06/20/2012   ARTHRALGIA 01/28/2009   CHEST PAIN UNSPECIFIED 11/26/2008   DEPRESSION 09/04/2006   Essential hypertension 09/04/2006   GERD 09/04/2006   MULTIPLE SITES, ARTHRITIS-ACUTE/CHRONIC 09/04/2006   LOW BACK PAIN, CHRONIC 09/04/2006    Past Surgical History:  Procedure Laterality Date   ABDOMINAL HYSTERECTOMY     ANTERIOR CERVICAL DECOMPRESSION/DISCECTOMY FUSION 4 LEVELS N/A 11/11/2019   Procedure: Cervical three-four Cervical four-five Cervical five-six Cervical six-seven  Anterior cervical decompression/discectomy/fusion;  Surgeon: Erline Levine, MD;  Location: [place];  Service: Neurosurgery;  Laterality: N/A;   LEFT HEART CATHETERIZATION WITH CORONARY ANGIOGRAM N/A 11/05/2013   Procedure: LEFT HEART CATHETERIZATION WITH CORONARY ANGIOGRAM;  Surgeon: Troy Sine, MD;  Location: [facility] CATH LAB;  Service: Cardiovascular;  Laterality: N/A;     OB History   No obstetric history on file.     History reviewed. No pertinent family history.  Social History   Tobacco Use   Smoking status: Never   Smokeless tobacco: Never  Vaping Use   Vaping Use: Never used  Substance Use Topics   Alcohol use: No   Drug use: No  Home Medications Prior to Admission medications   Medication Sig Start Date End Date Taking? Authorizing Provider  lidocaine (LIDODERM) 5 % Place 1 patch onto the skin daily. Remove & Discard patch within 12 hours or as directed by MD 07/21/20  Yes Enisa Runyan, MD  acetaminophen (TYLENOL) 325 MG tablet Take 2 tablets (650 mg total) by mouth every 6 (six) hours as needed for up to 30 doses for mild pain or moderate pain. 05/28/20    Wyvonnia Dusky, MD  albuterol (VENTOLIN HFA) 108 (90 Base) MCG/ACT inhaler Inhale 1-2 puffs into the lungs every 6 (six) hours as needed for wheezing or shortness of breath. 04/22/20   Vevelyn Francois, NP  amoxicillin (AMOXIL) 500 MG capsule Take 1 capsule (500 mg total) by mouth 2 (two) times daily. Patient not taking: Reported on 07/06/2020 06/29/20   Ripley Fraise, MD  aspirin EC 81 MG tablet Take 81 mg by mouth daily. Swallow whole.    [provider]  Blood Glucose Monitoring Suppl [facility] VERIO) w/Device KIT Use 1-4 times daily as needed/directed DX E11.9 04/25/20   Vevelyn Francois, NP  chlorthalidone (HYGROTON) 25 MG tablet Take 1 tablet (25 mg total) by mouth daily. 04/22/20   Vevelyn Francois, NP  cloNIDine (CATAPRES) 0.2 MG tablet Take 1 tablet (0.2 mg total) by mouth 2 (two) times daily. 12/10/19   Jaynee Eagles, PA-C  clopidogrel (PLAVIX) 75 MG tablet Take 1 tablet (75 mg total) by mouth daily. 04/22/20   Vevelyn Francois, NP  diphenhydramine-acetaminophen (TYLENOL PM) 25-500 MG TABS tablet Take 1 tablet by mouth at bedtime.    [provider]  gabapentin (NEURONTIN) 100 MG capsule Take 1 capsule (100 mg total) by mouth 3 (three) times daily. 04/18/20 07/06/20  Margarita Mail, PA-C  glipiZIDE (GLUCOTROL) 5 MG tablet Take 1 tablet (5 mg total) by mouth 2 (two) times daily before a meal. 04/22/20 04/22/21  Vevelyn Francois, NP  hydrALAZINE (APRESOLINE) 50 MG tablet Take 1 tablet (50 mg total) by mouth 2 (two) times daily. 04/22/20   Vevelyn Francois, NP  Lancets [facility] ULTRASOFT) lancets Use 1-4 times daily as needed/directed  DX E11.9 04/25/20   Vevelyn Francois, NP  lidocaine (LIDODERM) 5 % Place 1 patch onto the skin daily. Remove & Discard patch within 12 hours or as directed by MD Patient not taking: Reported on 07/06/2020 04/22/20 04/22/21  Vevelyn Francois, NP  losartan (COZAAR) 50 MG tablet Take 50 mg by mouth daily. 05/07/20   [provider]  metFORMIN (GLUCOPHAGE) 500 MG  tablet Take 1.5 tablets (750 mg total) by mouth 2 (two) times daily with a meal. Patient taking differently: Take 1,000 mg by mouth 2 (two) times daily with a meal. 10/27/19 07/06/20  Veryl Speak, MD  methocarbamol (ROBAXIN) 500 MG tablet TAKE 1 TABLET BY MOUTH 4 TIMES DAILY. Patient taking differently: Take 500 mg by mouth in the morning and at bedtime. 05/09/20   Vevelyn Francois, NP  MYRBETRIQ 25 MG TB24 tablet Take 25 mg by mouth daily. 05/18/20   [provider]  Roma Schanz test strip Use 1-4 times daily as directed/needed   DX E11.9 04/25/20   Vevelyn Francois, NP  oxyCODONE (ROXICODONE) 5 MG immediate release tablet Take 1 tablet (5 mg total) by mouth every 6 (six) hours as needed for up to 4 doses for severe pain. 05/28/20   Wyvonnia Dusky, MD  penicillin v potassium (VEETID) 500 MG tablet Take  1 tablet (500 mg total) by mouth 4 (four) times daily for 7 days. 07/19/20 07/26/20  Joann Jorge, MD  potassium chloride (KLOR-CON) 10 MEQ tablet Take 1 tablet (10 mEq total) by mouth daily. 04/25/20   Noemi Chapel, MD  potassium chloride SA (KLOR-CON) 20 MEQ tablet Take 1 tablet (20 mEq total) by mouth 2 (two) times daily. 07/13/20   Fredia Sorrow, MD  rosuvastatin (CRESTOR) 10 MG tablet Take 1 tablet (10 mg total) by mouth at bedtime. 04/22/20 04/22/21  Vevelyn Francois, NP    Allergies    Codeine, Ibuprofen, and Imdur [isosorbide nitrate]  Review of Systems   Review of Systems  Constitutional:  Negative for fatigue and fever.  HENT:  Negative for congestion, ear pain, hearing loss, postnasal drip, sinus pressure and sore throat.   Eyes:  Negative for blurred vision, photophobia and pain.  Respiratory:  Negative for cough.   Cardiovascular:  Negative for syncope and near-syncope.  Gastrointestinal:  Negative for abdominal pain, diarrhea, nausea and vomiting.  Genitourinary:  Negative for dysuria.  Musculoskeletal:  Positive for arthralgias. Negative for back pain, myalgias and neck  stiffness.  Skin:  Negative for rash.  Neurological:  Positive for headaches. Negative for dizziness, focal weakness, seizures, weakness, numbness, paresthesias and loss of balance.  Psychiatric/Behavioral:  Negative for agitation.   All other systems reviewed and are negative.  Physical Exam Updated Vital Signs BP (!) 180/69   Pulse 62   Temp (!) 97.5 F (36.4 C) (Oral)   Resp 16   SpO2 98%   Physical Exam Vitals and nursing note reviewed.  Constitutional:      General: She is not in acute distress.    Appearance: Normal appearance.  HENT:     Head: Normocephalic and atraumatic.     Nose: Nose normal.     Mouth/Throat:     Mouth: Mucous membranes are moist.     Pharynx: Oropharynx is clear.  Eyes:     Extraocular Movements: Extraocular movements intact.     Conjunctiva/sclera: Conjunctivae normal.     Pupils: Pupils are equal, round, and reactive to light.  Cardiovascular:     Rate and Rhythm: Normal rate and regular rhythm.     Pulses: Normal pulses.     Heart sounds: Normal heart sounds.  Pulmonary:     Effort: Pulmonary effort is normal.     Breath sounds: Normal breath sounds.  Abdominal:     General: Abdomen is flat. Bowel sounds are normal.     Palpations: Abdomen is soft.     Tenderness: There is no abdominal tenderness. There is no guarding.  Musculoskeletal:        General: Normal range of motion.     Cervical back: Normal range of motion and neck supple. No rigidity.  Lymphadenopathy:     Cervical: No cervical adenopathy.  Skin:    General: Skin is warm and dry.     Capillary Refill: Capillary refill takes less than 2 seconds.     Findings: No lesion or rash.  Neurological:     General: No focal deficit present.     Mental Status: She is alert and oriented to person, place, and time.     GCS: GCS eye subscore is 4. GCS verbal subscore is 5. GCS motor subscore is 6.     Sensory: No sensory deficit.     Motor: No weakness.     Deep Tendon Reflexes:  Reflexes normal.  Psychiatric:  Mood and Affect: Mood normal.        Behavior: Behavior normal.    ED Results / Procedures / Treatments   Labs (all labs ordered are listed, but only abnormal results are displayed) Labs Reviewed - No data to display  EKG None  Radiology No results found.  Procedures Procedures   Medications Ordered in ED Medications  acetaminophen (TYLENOL) tablet 650 mg (650 mg Oral Given 07/21/20 0347)  amLODipine (NORVASC) tablet 10 mg (10 mg Oral Given 07/21/20 0410)    ED Course  I have reviewed the triage vital signs and the nursing notes.  Pertinent labs & imaging results that were available during my care of the patient were reviewed by me and considered in my medical decision making (see chart for details).   This patient was seen by me 2 nights ago and had an extensive work up including neuro consult.  Her symptoms of head and neck pain are chronic.  She has a chronic pain symptom.  She needs to follow up with dentistry as directed and her PMD. I see no signs of any injuries or infection to either leg and BLE are NVI.  Patient is stable for discharge.  Follow up as directed.    Noni Stonesifer was evaluated in Emergency Department on 07/21/2020 for the symptoms described in the history of present illness. She was evaluated in the context of the global COVID-19 pandemic, which necessitated consideration that the patient might be at risk for infection with the SARS-CoV-2 virus that causes COVID-19. Institutional protocols and algorithms that pertain to the evaluation of patients at risk for COVID-19 are in a state of rapid change based on information released by regulatory bodies including the CDC and federal and state organizations. These policies and algorithms were followed during the patient's care in the ED.     Final Clinical Impression(s) / ED Diagnoses Final diagnoses:  Primary hypertension  Headache disorder  Pain in both lower extremities     Return for intractable cough, coughing up blood, fevers > 100.4 unrelieved by medication, shortness of breath, intractable vomiting, chest pain, shortness of breath, weakness, numbness, changes in speech, facial asymmetry, abdominal pain, passing out, Inability to tolerate liquids or food, cough, altered mental status or any concerns. No signs of systemic illness or infection. The patient is nontoxic-appearing on exam and vital signs are within normal limits. I have reviewed the triage vital signs and the nursing notes. Pertinent labs & imaging results that were available during my care of the patient were reviewed by me and considered in my medical decision making (see chart for details). After history, exam, and medical workup I feel the patient has been appropriately medically screened and is safe for discharge home. Pertinent diagnoses were discussed with the patient. Patient was given return precautions.  Rx / DC Orders ED Discharge Orders          Ordered    lidocaine (LIDODERM) 5 %  Every 24 hours        07/21/20 0545             Giavonna Pflum, MD 07/21/20 [id number]

## 2020-07-21 NOTE — ED Notes (Signed)
Patient verbalizes understanding of discharge instructions. Opportunity for questioning and answers were provided. Armband removed by staff, pt discharged from ED via wheelchair.  

## 2020-07-21 NOTE — ED Notes (Signed)
Pt given crackers and water. 

## 2020-07-21 NOTE — ED Notes (Signed)
This RN offered to call taxi for pt, pt refused. Pt stated to this RN "I will just have to wait for a bus." Pt waiting in lobby.

## 2020-07-21 NOTE — ED Triage Notes (Signed)
Patient presents with headache, left flank pain and "feet hurt real bad." Patient had a stroke but said to "check with [facility] hospital" about the date of it.

## 2020-07-21 NOTE — ED Provider Notes (Signed)
[facility] DEPT Provider Note   CSN: [id number] Arrival date & time: 07/21/20  2046     History Chief Complaint  Patient presents with   Headache   Foot Pain    [name] is a 75 y.o. female.  The history is provided by the patient.  Headache Pain location:  Generalized Quality:  Dull Radiates to:  Does not radiate Onset quality:  Gradual Duration: months. Timing:  Constant Progression:  Unchanged Chronicity:  Chronic Context: not activity, not exposure to bright light, not caffeine, not coughing, not defecating, not eating, not stress, not exposure to cold air, not intercourse, not loud noise and not straining   Relieved by:  Nothing Worsened by:  Nothing Ineffective treatments:  None tried Associated symptoms: no abdominal pain, no back pain, no blurred vision, no congestion, no cough, no diarrhea, no dizziness, no drainage, no ear pain, no eye pain, no facial pain, no fatigue, no fever, no focal weakness, no hearing loss, no loss of balance, no myalgias, no nausea, no near-syncope, no neck pain, no neck stiffness, no numbness, no paresthesias, no photophobia, no seizures, no sinus pressure, no sore throat, no swollen glands, no syncope, no tingling, no URI, no visual change, no vomiting and no weakness   Risk factors: no anger   Foot Pain Associated symptoms include headaches. Pertinent negatives include no abdominal pain.      Past Medical History:  Diagnosis Date   Diabetes mellitus without complication ([place])    GERD 09/04/2006   Qualifier: Diagnosis of  By: Suzie Portela     History of echocardiogram    a. 2D ECHO: 11/06/2013 EF 65%; no WMA. Mild TR. 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   Numbness 06/20/2012   Diabetes mellitus ([place]) 06/20/2012   CVA (cerebral infarction) 06/20/2012   Hypokalemia 06/20/2012   ARTHRALGIA 01/28/2009   CHEST PAIN UNSPECIFIED 11/26/2008   DEPRESSION 09/04/2006   Essential hypertension 09/04/2006   GERD 09/04/2006   MULTIPLE SITES, ARTHRITIS-ACUTE/CHRONIC 09/04/2006   LOW BACK PAIN, CHRONIC 09/04/2006    Past Surgical History:  Procedure Laterality Date   ABDOMINAL HYSTERECTOMY     ANTERIOR CERVICAL DECOMPRESSION/DISCECTOMY FUSION 4 LEVELS N/A 11/11/2019   Procedure: Cervical three-four Cervical four-five Cervical five-six Cervical six-seven  Anterior cervical decompression/discectomy/fusion;  Surgeon: Erline Levine, MD;  Location: [place] [facility];  Service: Neurosurgery;  Laterality: N/A;   LEFT HEART CATHETERIZATION WITH CORONARY ANGIOGRAM N/A 11/05/2013   Procedure: LEFT HEART CATHETERIZATION WITH CORONARY ANGIOGRAM;  Surgeon: Troy Sine, MD;  Location: [facility] CATH LAB;  Service: Cardiovascular;  Laterality: N/A;     OB History   No obstetric history on file.     History reviewed. No pertinent family history.  Social History   Tobacco Use   Smoking status: Never   Smokeless tobacco: Never  Vaping Use   Vaping Use: Never used  Substance Use Topics   Alcohol use: No   Drug use: No    Home Medications Prior to Admission medications   Medication Sig Start Date  End Date Taking? Authorizing Provider  acetaminophen (TYLENOL) 325 MG tablet Take 2 tablets (650 mg total) by mouth every 6 (six) hours as needed for up to 30 doses for mild pain or moderate pain. 05/28/20   Wyvonnia Dusky, MD  albuterol (VENTOLIN HFA) 108 (90 Base) MCG/ACT inhaler Inhale 1-2 puffs into the lungs every 6 (six) hours as needed for wheezing or shortness of breath. 04/22/20    Vevelyn Francois, NP  amoxicillin (AMOXIL) 500 MG capsule Take 1 capsule (500 mg total) by mouth 2 (two) times daily. Patient not taking: Reported on 07/06/2020 06/29/20   Ripley Fraise, MD  aspirin EC 81 MG tablet Take 81 mg by mouth daily. Swallow whole.    [provider]  Blood Glucose Monitoring Suppl [facility] VERIO) w/Device KIT Use 1-4 times daily as needed/directed DX E11.9 04/25/20   Vevelyn Francois, NP  chlorthalidone (HYGROTON) 25 MG tablet Take 1 tablet (25 mg total) by mouth daily. 04/22/20   Vevelyn Francois, NP  cloNIDine (CATAPRES) 0.2 MG tablet Take 1 tablet (0.2 mg total) by mouth 2 (two) times daily. 12/10/19   Jaynee Eagles, PA-C  clopidogrel (PLAVIX) 75 MG tablet Take 1 tablet (75 mg total) by mouth daily. 04/22/20   Vevelyn Francois, NP  diphenhydramine-acetaminophen (TYLENOL PM) 25-500 MG TABS tablet Take 1 tablet by mouth at bedtime.    [provider]  gabapentin (NEURONTIN) 100 MG capsule Take 1 capsule (100 mg total) by mouth 3 (three) times daily. 04/18/20 07/06/20  Margarita Mail, PA-C  glipiZIDE (GLUCOTROL) 5 MG tablet Take 1 tablet (5 mg total) by mouth 2 (two) times daily before a meal. 04/22/20 04/22/21  Vevelyn Francois, NP  hydrALAZINE (APRESOLINE) 50 MG tablet Take 1 tablet (50 mg total) by mouth 2 (two) times daily. 04/22/20   Vevelyn Francois, NP  Lancets [facility] ULTRASOFT) lancets Use 1-4 times daily as needed/directed  DX E11.9 04/25/20   Vevelyn Francois, NP  lidocaine (LIDODERM) 5 % Place 1 patch onto the skin daily. Remove & Discard patch within 12 hours or as directed by MD Patient not taking: Reported on 07/06/2020 04/22/20 04/22/21  Vevelyn Francois, NP  lidocaine (LIDODERM) 5 % Place 1 patch onto the skin daily. Remove & Discard patch within 12 hours or as directed by MD 07/21/20   Randal Buba, Kejon Feild, MD  losartan (COZAAR) 50 MG tablet Take 50 mg by mouth daily. 05/07/20   [provider]  metFORMIN (GLUCOPHAGE) 500 MG tablet Take 1.5 tablets (750 mg total)  by mouth 2 (two) times daily with a meal. Patient taking differently: Take 1,000 mg by mouth 2 (two) times daily with a meal. 10/27/19 07/06/20  Veryl Speak, MD  methocarbamol (ROBAXIN) 500 MG tablet TAKE 1 TABLET BY MOUTH 4 TIMES DAILY. Patient taking differently: Take 500 mg by mouth in the morning and at bedtime. 05/09/20   Vevelyn Francois, NP  MYRBETRIQ 25 MG TB24 tablet Take 25 mg by mouth daily. 05/18/20   [provider]  Roma Schanz test strip Use 1-4 times daily as directed/needed   DX E11.9 04/25/20   Vevelyn Francois, NP  oxyCODONE (ROXICODONE) 5 MG immediate release tablet Take 1 tablet (5 mg total) by mouth every 6 (six) hours as needed for up to 4 doses for severe pain. 05/28/20   Wyvonnia Dusky, MD  penicillin v potassium (VEETID) 500 MG tablet Take 1 tablet (500 mg total) by mouth 4 (four) times daily for  7 days. 07/19/20 07/26/20  Nikos Anglemyer, MD  potassium chloride (KLOR-CON) 10 MEQ tablet Take 1 tablet (10 mEq total) by mouth daily. 04/25/20   Noemi Chapel, MD  potassium chloride SA (KLOR-CON) 20 MEQ tablet Take 1 tablet (20 mEq total) by mouth 2 (two) times daily. 07/13/20   Fredia Sorrow, MD  rosuvastatin (CRESTOR) 10 MG tablet Take 1 tablet (10 mg total) by mouth at bedtime. 04/22/20 04/22/21  Vevelyn Francois, NP    Allergies    Codeine, Ibuprofen, and Imdur [isosorbide nitrate]  Review of Systems   Review of Systems  Constitutional:  Negative for fatigue and fever.  HENT:  Negative for congestion, ear pain, hearing loss, postnasal drip, sinus pressure and sore throat.   Eyes:  Negative for blurred vision, photophobia and pain.  Respiratory:  Negative for cough.   Cardiovascular:  Negative for syncope and near-syncope.  Gastrointestinal:  Negative for abdominal pain, diarrhea, nausea and vomiting.  Genitourinary:  Negative for difficulty urinating.  Musculoskeletal:  Negative for back pain, myalgias, neck pain and neck stiffness.  Neurological:  Positive for  headaches. Negative for dizziness, focal weakness, seizures, weakness, numbness, paresthesias and loss of balance.  Psychiatric/Behavioral:  Negative for agitation.   All other systems reviewed and are negative.  Physical Exam Updated Vital Signs BP (!) 144/69   Pulse 72   Temp 97.7 F (36.5 C) (Oral)   Resp 16   Ht $R'5\' 5"'GM$  (1.651 m)   Wt 68 kg   SpO2 97%   BMI 24.95 kg/m   Physical Exam Vitals and nursing note reviewed.  Constitutional:      General: She is not in acute distress.    Appearance: Normal appearance.  HENT:     Head: Normocephalic and atraumatic.     Nose: Nose normal.  Eyes:     Conjunctiva/sclera: Conjunctivae normal.     Pupils: Pupils are equal, round, and reactive to light.  Cardiovascular:     Rate and Rhythm: Normal rate and regular rhythm.     Pulses: Normal pulses.     Heart sounds: Normal heart sounds.  Pulmonary:     Effort: Pulmonary effort is normal.     Breath sounds: Normal breath sounds.  Abdominal:     General: Abdomen is flat. Bowel sounds are normal.     Palpations: Abdomen is soft.     Tenderness: There is no abdominal tenderness. There is no guarding.  Musculoskeletal:        General: Normal range of motion.     Cervical back: Normal range of motion and neck supple.  Skin:    General: Skin is warm and dry.     Capillary Refill: Capillary refill takes less than 2 seconds.  Neurological:     General: No focal deficit present.     Mental Status: She is alert and oriented to person, place, and time.     Deep Tendon Reflexes: Reflexes normal.  Psychiatric:        Mood and Affect: Mood normal.        Behavior: Behavior normal.    ED Results / Procedures / Treatments   Labs (all labs ordered are listed, but only abnormal results are displayed) Labs Reviewed - No data to display  EKG None  Radiology No results found.  Procedures Procedures   Medications Ordered in ED Medications  acetaminophen (TYLENOL) tablet 1,000 mg  (has no administration in time range)    ED Course  I have reviewed the  triage vital signs and the nursing notes.  Pertinent labs & imaging results that were available during my care of the patient were reviewed by me and considered in my medical decision making (see chart for details).   Seen by me 3 times this week, full work up done has told to follow up.  She has not done that.  There is no indication for any additional work up as nothing has changed.    [name] was evaluated in Emergency Department on 07/21/2020 for the symptoms described in the history of present illness. She was evaluated in the context of the global COVID-19 pandemic, which necessitated consideration that the patient might be at risk for infection with the SARS-CoV-2 virus that causes COVID-19. Institutional protocols and algorithms that pertain to the evaluation of patients at risk for COVID-19 are in a state of rapid change based on information released by regulatory bodies including the CDC and federal and state organizations. These policies and algorithms were followed during the patient's care in the ED.  Final Clinical Impression(s) / ED Diagnoses Final diagnoses:  None   Return for intractable cough, coughing up blood, fevers > 100.4 unrelieved by medication, shortness of breath, intractable vomiting, chest pain, shortness of breath, weakness, numbness, changes in speech, facial asymmetry, abdominal pain, passing out, Inability to tolerate liquids or food, cough, altered mental status or any concerns. No signs of systemic illness or infection. The patient is nontoxic-appearing on exam and vital signs are within normal limits. I have reviewed the triage vital signs and the nursing notes. Pertinent labs & imaging results that were available during my care of the patient were reviewed by me and considered in my medical decision making (see chart for details). After history, exam, and medical workup I feel the patient  has been appropriately medically screened and is safe for discharge home. Pertinent diagnoses were discussed with the patient. Patient was given return precautions.     Tonie Vizcarrondo, MD 07/21/20 2328

## 2020-07-26 ENCOUNTER — Other Ambulatory Visit: Payer: Self-pay

## 2020-07-26 ENCOUNTER — Encounter (HOSPITAL_COMMUNITY): Payer: Self-pay | Admitting: Emergency Medicine

## 2020-07-26 ENCOUNTER — Emergency Department (HOSPITAL_COMMUNITY)
Admission: EM | Admit: 2020-07-26 | Discharge: 2020-07-27 | Disposition: A | Discharge status: Home / Self Care | Payer: Medicare Other | Attending: Emergency Medicine | Admitting: Emergency Medicine

## 2020-07-26 DIAGNOSIS — I10 Essential (primary) hypertension: Secondary | ICD-10-CM | POA: Insufficient documentation

## 2020-07-26 DIAGNOSIS — Z7689 Persons encountering health services in other specified circumstances: Secondary | ICD-10-CM

## 2020-07-26 DIAGNOSIS — R519 Headache, unspecified: Secondary | ICD-10-CM | POA: Diagnosis not present

## 2020-07-26 DIAGNOSIS — Z79899 Other long term (current) drug therapy: Secondary | ICD-10-CM | POA: Diagnosis not present

## 2020-07-26 DIAGNOSIS — Z8616 Personal history of COVID-19: Secondary | ICD-10-CM | POA: Diagnosis not present

## 2020-07-26 DIAGNOSIS — Z7984 Long term (current) use of oral hypoglycemic drugs: Secondary | ICD-10-CM | POA: Insufficient documentation

## 2020-07-26 DIAGNOSIS — G8929 Other chronic pain: Secondary | ICD-10-CM | POA: Insufficient documentation

## 2020-07-26 DIAGNOSIS — K089 Disorder of teeth and supporting structures, unspecified: Secondary | ICD-10-CM | POA: Diagnosis not present

## 2020-07-26 DIAGNOSIS — M542 Cervicalgia: Secondary | ICD-10-CM | POA: Insufficient documentation

## 2020-07-26 DIAGNOSIS — E119 Type 2 diabetes mellitus without complications: Secondary | ICD-10-CM | POA: Insufficient documentation

## 2020-07-26 DIAGNOSIS — Z7982 Long term (current) use of aspirin: Secondary | ICD-10-CM | POA: Diagnosis not present

## 2020-07-26 DIAGNOSIS — K0889 Other specified disorders of teeth and supporting structures: Secondary | ICD-10-CM | POA: Diagnosis not present

## 2020-07-26 NOTE — ED Provider Notes (Signed)
Emergency Medicine Provider Triage Evaluation Note  [name] , a 75 y.o. female  was evaluated in triage.  Pt complains of headache x 1 week. Pain also radiating to her neck. Multiple visits for headaches in the past. No MAT.   Review of Systems  Positive: headache Negative: Fever, nausea, vomiting  Physical Exam  BP (!) 190/84 (BP Location: Right Arm)   Pulse 61   Temp 97.9 F (36.6 C) (Oral)   Resp 16   SpO2 99%  Gen:   Awake, no distress   Resp:  Normal effort  MSK:   Moves extremities without difficulty  Other:    Medical Decision Making  Medically screening exam initiated at 8:22 PM.  Appropriate orders placed.  [name] was informed that the remainder of the evaluation will be completed by another provider, this initial triage assessment does not replace that evaluation, and the importance of remaining in the ED until their evaluation is complete.     [name], PA-C 07/26/20 2023    [name], MD 07/26/20 2038

## 2020-07-26 NOTE — ED Triage Notes (Signed)
Pt c/o headache and neck pain x 1 week. A&O x4.

## 2020-07-27 DIAGNOSIS — G8929 Other chronic pain: Secondary | ICD-10-CM | POA: Diagnosis not present

## 2020-07-27 DIAGNOSIS — M542 Cervicalgia: Secondary | ICD-10-CM | POA: Diagnosis not present

## 2020-07-27 DIAGNOSIS — K0889 Other specified disorders of teeth and supporting structures: Secondary | ICD-10-CM | POA: Diagnosis not present

## 2020-07-27 MED ORDER — DICLOFENAC SODIUM 1 % EX GEL: 2.0000 g | Freq: Four times a day (QID) | CUTANEOUS | 0 refills | Status: DC | PRN

## 2020-07-27 MED ORDER — DICLOFENAC SODIUM 1 % EX GEL
2.0000 g | Freq: Once | CUTANEOUS | Status: AC
  Administered 2020-07-27: 2 g via TOPICAL
  Filled 2020-07-27: qty 100

## 2020-07-27 NOTE — ED Provider Notes (Signed)
[facility] EMERGENCY DEPARTMENT Provider Note   CSN: [id number] Arrival date & time: 07/26/20  2008     History Chief Complaint  Patient presents with   Headache    [name] is a 75 y.o. female.  The history is provided by the patient and medical records.  Headache [name] is a 75 y.o. female who presents to the Emergency Department complaining of HA/neck pain.  She presents to the ED complaining of chronic posterior neck pain that has been present for months.  Pain radiates to her head and is in the midline.  No associated fevers, numbness, weakness.  She had been treating it with lidoderm patches and acetaminophen but feels like this treatment is no longer worsening.  Lives alone.  She also requests a dentist due to poor dentition.  No falls or recent injuries.      Past Medical History:  Diagnosis Date   Diabetes mellitus without complication ([place])    GERD 09/04/2006   Qualifier: Diagnosis of  By: Suzie Portela     History of echocardiogram    a. 2D ECHO: 11/06/2013 EF 65%; no WMA. Mild TR. 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   Numbness 06/20/2012   Diabetes mellitus ([place]) 06/20/2012   CVA (cerebral infarction) 06/20/2012   Hypokalemia 06/20/2012   ARTHRALGIA 01/28/2009   CHEST PAIN UNSPECIFIED 11/26/2008   DEPRESSION 09/04/2006   Essential hypertension 09/04/2006   GERD 09/04/2006   MULTIPLE SITES, ARTHRITIS-ACUTE/CHRONIC 09/04/2006   LOW BACK PAIN, CHRONIC 09/04/2006    Past Surgical History:  Procedure  Laterality Date   ABDOMINAL HYSTERECTOMY     ANTERIOR CERVICAL DECOMPRESSION/DISCECTOMY FUSION 4 LEVELS N/A 11/11/2019   Procedure: Cervical three-four Cervical four-five Cervical five-six Cervical six-seven  Anterior cervical decompression/discectomy/fusion;  Surgeon: Erline Levine, MD;  Location: [place];  Service: Neurosurgery;  Laterality: N/A;   LEFT HEART CATHETERIZATION WITH CORONARY ANGIOGRAM N/A 11/05/2013   Procedure: LEFT HEART CATHETERIZATION WITH CORONARY ANGIOGRAM;  Surgeon: Troy Sine, MD;  Location: [facility] CATH LAB;  Service: Cardiovascular;  Laterality: N/A;     OB History   No obstetric history on file.     No family history on file.  Social History   Tobacco Use   Smoking status: Never   Smokeless tobacco: Never  Vaping Use   Vaping Use: Never used  Substance Use Topics   Alcohol use: No   Drug use: No    Home Medications Prior to Admission medications   Medication Sig Start Date End Date Taking? Authorizing Provider  diclofenac Sodium (VOLTAREN) 1 % GEL Apply 2 g topically 4 (four) times daily as needed. 07/27/20  Yes Quintella Reichert, MD  acetaminophen (TYLENOL) 325 MG tablet Take 2 tablets (650 mg total) by mouth every 6 (six) hours as needed for up to 30 doses for mild pain or moderate pain. 05/28/20   Wyvonnia Dusky, MD  albuterol (VENTOLIN HFA) 108 (90 Base) MCG/ACT inhaler Inhale 1-2 puffs into the lungs every 6 (six) hours as needed for wheezing or shortness  of breath. 04/22/20   Vevelyn Francois, NP  amoxicillin (AMOXIL) 500 MG capsule Take 1 capsule (500 mg total) by mouth 2 (two) times daily. Patient not taking: Reported on 07/06/2020 06/29/20   Ripley Fraise, MD  aspirin EC 81 MG tablet Take 81 mg by mouth daily. Swallow whole.    [provider]  Blood Glucose Monitoring Suppl [facility] VERIO) w/Device KIT Use 1-4 times daily as needed/directed DX E11.9 04/25/20   Vevelyn Francois, NP  chlorthalidone (HYGROTON) 25 MG tablet Take 1 tablet (25 mg  total) by mouth daily. 04/22/20   Vevelyn Francois, NP  cloNIDine (CATAPRES) 0.2 MG tablet Take 1 tablet (0.2 mg total) by mouth 2 (two) times daily. 12/10/19   Jaynee Eagles, PA-C  clopidogrel (PLAVIX) 75 MG tablet Take 1 tablet (75 mg total) by mouth daily. 04/22/20   Vevelyn Francois, NP  diphenhydramine-acetaminophen (TYLENOL PM) 25-500 MG TABS tablet Take 1 tablet by mouth at bedtime.    [provider]  gabapentin (NEURONTIN) 100 MG capsule Take 1 capsule (100 mg total) by mouth 3 (three) times daily. 04/18/20 07/06/20  Margarita Mail, PA-C  glipiZIDE (GLUCOTROL) 5 MG tablet Take 1 tablet (5 mg total) by mouth 2 (two) times daily before a meal. 04/22/20 04/22/21  Vevelyn Francois, NP  hydrALAZINE (APRESOLINE) 50 MG tablet Take 1 tablet (50 mg total) by mouth 2 (two) times daily. 04/22/20   Vevelyn Francois, NP  Lancets [facility] ULTRASOFT) lancets Use 1-4 times daily as needed/directed  DX E11.9 04/25/20   Vevelyn Francois, NP  lidocaine (LIDODERM) 5 % Place 1 patch onto the skin daily. Remove & Discard patch within 12 hours or as directed by MD Patient not taking: Reported on 07/06/2020 04/22/20 04/22/21  Vevelyn Francois, NP  lidocaine (LIDODERM) 5 % Place 1 patch onto the skin daily. Remove & Discard patch within 12 hours or as directed by MD 07/21/20   Randal Buba, April, MD  losartan (COZAAR) 50 MG tablet Take 50 mg by mouth daily. 05/07/20   [provider]  metFORMIN (GLUCOPHAGE) 500 MG tablet Take 1.5 tablets (750 mg total) by mouth 2 (two) times daily with a meal. Patient taking differently: Take 1,000 mg by mouth 2 (two) times daily with a meal. 10/27/19 07/06/20  Veryl Speak, MD  methocarbamol (ROBAXIN) 500 MG tablet TAKE 1 TABLET BY MOUTH 4 TIMES DAILY. Patient taking differently: Take 500 mg by mouth in the morning and at bedtime. 05/09/20   Vevelyn Francois, NP  MYRBETRIQ 25 MG TB24 tablet Take 25 mg by mouth daily. 05/18/20   [provider]  Roma Schanz test strip Use 1-4 times  daily as directed/needed   DX E11.9 04/25/20   Vevelyn Francois, NP  oxyCODONE (ROXICODONE) 5 MG immediate release tablet Take 1 tablet (5 mg total) by mouth every 6 (six) hours as needed for up to 4 doses for severe pain. 05/28/20   Wyvonnia Dusky, MD  potassium chloride (KLOR-CON) 10 MEQ tablet Take 1 tablet (10 mEq total) by mouth daily. 04/25/20   Noemi Chapel, MD  potassium chloride SA (KLOR-CON) 20 MEQ tablet Take 1 tablet (20 mEq total) by mouth 2 (two) times daily. 07/13/20   Fredia Sorrow, MD  rosuvastatin (CRESTOR) 10 MG tablet Take 1 tablet (10 mg total) by mouth at bedtime. 04/22/20 04/22/21  Vevelyn Francois, NP    Allergies    Codeine, Ibuprofen, and Imdur [isosorbide nitrate]  Review of Systems  Review of Systems  Neurological:  Positive for headaches.  All other systems reviewed and are negative.  Physical Exam Updated Vital Signs BP (!) 186/61 (BP Location: Right Arm)   Pulse 66   Temp 97.9 F (36.6 C) (Oral)   Resp 16   Ht _0  (1.651 m)   Wt 68 kg   SpO2 100%   BMI 24.95 kg/m   Physical Exam Vitals and nursing note reviewed.  Constitutional:      Appearance: She is well-developed.  HENT:     Head: Normocephalic and atraumatic.  Neck:     Comments: No bony tenderness on C/T spine Cardiovascular:     Rate and Rhythm: Normal rate and regular rhythm.     Heart sounds: No murmur heard. Pulmonary:     Effort: Pulmonary effort is normal. No respiratory distress.     Breath sounds: Normal breath sounds.  Abdominal:     Palpations: Abdomen is soft.     Tenderness: There is no abdominal tenderness. There is no guarding or rebound.  Musculoskeletal:        General: No tenderness.     Comments: 2+ radial pulses bilaterally  Skin:    General: Skin is warm and dry.  Neurological:     Mental Status: She is alert and oriented to person, place, and time.     Comments: 5/5 strength in all four extremities.  Sensation to light touch intact in all four extremities.     Psychiatric:        Behavior: Behavior normal.    ED Results / Procedures / Treatments   Labs (all labs ordered are listed, but only abnormal results are displayed) Labs Reviewed - No data to display  EKG None  Radiology No results found.  Procedures Procedures   Medications Ordered in ED Medications  diclofenac Sodium (VOLTAREN) 1 % topical gel 2 g (2 g Topical Given 07/27/20 1102)    ED Course  I have reviewed the triage vital signs and the nursing notes.  Pertinent labs & imaging results that were available during my care of the patient were reviewed by me and considered in my medical decision making (see chart for details).    MDM Rules/Calculators/A&P                         Pt here for evaluation of persistent HA/neck pain for several months.  She is neurologically intact on evaluation.  She had recent imaging with CTA neck with FMD and atherosclerosis. No acute abnormality or dissection.  Presentation is not c/w SAH/CVA, dissection, meningitis.  Treatment options are limited given her ibuprofen allergy and age.  Will give trial of voltaren gel.  D/w pt importance of outpatient follow up as well as return precautions.    Final Clinical Impression(s) / ED Diagnoses Final diagnoses:  Chronic neck pain  Poor dentition requiring referral to dentistry    Rx / DC Orders ED Discharge Orders          Ordered    diclofenac Sodium (VOLTAREN) 1 % GEL  4 times daily PRN        07/27/20 1105             Quintella Reichert, MD 07/27/20 1336

## 2020-07-27 NOTE — ED Notes (Signed)
Pt verbalizes understanding of discharge instructions. Opportunity for questions and answers were provided. Pt discharged from the ED.   ?

## 2020-07-27 NOTE — Care Management (Addendum)
[facility] transportation arranged for transport home. Patient has signed waiver in the past.Patient has been wheeled out to lobby via w/c

## 2020-07-28 ENCOUNTER — Emergency Department (HOSPITAL_COMMUNITY): Payer: Medicare Other

## 2020-07-28 ENCOUNTER — Emergency Department (HOSPITAL_COMMUNITY)
Admission: EM | Admit: 2020-07-28 | Discharge: 2020-07-28 | Disposition: A | Discharge status: Home / Self Care | Payer: Medicare Other | Attending: Emergency Medicine | Admitting: Emergency Medicine

## 2020-07-28 ENCOUNTER — Encounter (HOSPITAL_COMMUNITY): Payer: Self-pay | Admitting: Emergency Medicine

## 2020-07-28 DIAGNOSIS — I10 Essential (primary) hypertension: Secondary | ICD-10-CM | POA: Diagnosis not present

## 2020-07-28 DIAGNOSIS — R200 Anesthesia of skin: Secondary | ICD-10-CM | POA: Diagnosis not present

## 2020-07-28 DIAGNOSIS — E1142 Type 2 diabetes mellitus with diabetic polyneuropathy: Secondary | ICD-10-CM | POA: Diagnosis not present

## 2020-07-28 DIAGNOSIS — Z7902 Long term (current) use of antithrombotics/antiplatelets: Secondary | ICD-10-CM | POA: Insufficient documentation

## 2020-07-28 DIAGNOSIS — Z79899 Other long term (current) drug therapy: Secondary | ICD-10-CM | POA: Diagnosis not present

## 2020-07-28 DIAGNOSIS — Z8616 Personal history of COVID-19: Secondary | ICD-10-CM | POA: Insufficient documentation

## 2020-07-28 DIAGNOSIS — Z7982 Long term (current) use of aspirin: Secondary | ICD-10-CM | POA: Insufficient documentation

## 2020-07-28 DIAGNOSIS — N179 Acute kidney failure, unspecified: Secondary | ICD-10-CM

## 2020-07-28 DIAGNOSIS — Z7984 Long term (current) use of oral hypoglycemic drugs: Secondary | ICD-10-CM | POA: Diagnosis not present

## 2020-07-28 DIAGNOSIS — D649 Anemia, unspecified: Secondary | ICD-10-CM | POA: Diagnosis not present

## 2020-07-28 DIAGNOSIS — R0789 Other chest pain: Secondary | ICD-10-CM | POA: Diagnosis not present

## 2020-07-28 DIAGNOSIS — R079 Chest pain, unspecified: Secondary | ICD-10-CM | POA: Diagnosis not present

## 2020-07-28 DIAGNOSIS — M79604 Pain in right leg: Secondary | ICD-10-CM | POA: Diagnosis present

## 2020-07-28 LAB — CBC
HCT: 33.5 % — ABNORMAL LOW (ref 36.0–46.0)
Hemoglobin: 10.9 g/dL — ABNORMAL LOW (ref 12.0–15.0)
MCH: 30.2 pg (ref 26.0–34.0)
MCHC: 32.5 g/dL (ref 30.0–36.0)
MCV: 92.8 fL (ref 80.0–100.0)
Platelets: 260 10*3/uL (ref 150–400)
RBC: 3.61 MIL/uL — ABNORMAL LOW (ref 3.87–5.11)
RDW: 12.4 % (ref 11.5–15.5)
WBC: 7.3 10*3/uL (ref 4.0–10.5)
nRBC: 0 % (ref 0.0–0.2)

## 2020-07-28 LAB — BASIC METABOLIC PANEL
Anion gap: 10 (ref 5–15)
BUN: 27 mg/dL — ABNORMAL HIGH (ref 8–23)
CO2: 27 mmol/L (ref 22–32)
Calcium: 8.9 mg/dL (ref 8.9–10.3)
Chloride: 100 mmol/L (ref 98–111)
Creatinine, Ser: 1.42 mg/dL — ABNORMAL HIGH (ref 0.44–1.00)
GFR, Estimated: 39 mL/min — ABNORMAL LOW (ref >60)
Glucose, Bld: 181 mg/dL — ABNORMAL HIGH (ref 70–99)
Potassium: 3.8 mmol/L (ref 3.5–5.1)
Sodium: 137 mmol/L (ref 135–145)

## 2020-07-28 LAB — TROPONIN I (HIGH SENSITIVITY)
Troponin I (High Sensitivity): 11 ng/L (ref <18)
Troponin I (High Sensitivity): 8 ng/L (ref <18)

## 2020-07-28 MED ORDER — SODIUM CHLORIDE 0.9 % IV BOLUS: 1000.0000 mL | Freq: Once | INTRAVENOUS | Status: DC

## 2020-07-28 MED ORDER — GABAPENTIN 300 MG PO CAPS: ORAL_CAPSULE | ORAL | 0 refills | Status: DC

## 2020-07-28 NOTE — Care Management (Signed)
ED RNCM  met with patient at bedside. Patient was in the ED yesterday with the similar complaints.  Patient has had 17 ED visits in the past 6 months.  Discussed in the past [facility] services and patient declined stating she does not want anyone in her home. Also discussed the importance of following up with her PCP, for care coordination, patient verbalized understanding. RNCM explained that I would send a message to her PCP to arrange a follow up appointment, patient was agreeable. Also provided patient with [facility] transportation information to assist with transport to [facility] health appointments. ED CM will follow up tomorrow on appointment arrangements.

## 2020-07-28 NOTE — ED Provider Notes (Signed)
[facility] EMERGENCY DEPARTMENT Provider Note   CSN: [id number] Arrival date & time: 07/28/20  1117     History Chief Complaint  Patient presents with   Chest Pain    [name] is a 75 y.o. female.  I saw the patient 11 hours after she had been in triage.  At this point, she told me a completely different chief complaint than she had earlier endorsed.  She told me that her nerves are bad and she might need something for sleep.  She then told me that she suffers from weakness and pain in her lower legs diffusely.  She has difficulty ambulating as a result.  She does not use a cane or a walker and is resistant to that.  She also does not want to have a home health or physical therapy consult.  She states that she does not like a lot of people in her home.  She has been seen in the ED multiple times over the past few months and has been using it more or less like her primary care provider.  She states that her doctor left the practice, and she has trouble accessing other clinics due to transportation concerns.  Her former doctor was on a bus stop, and this greatly helped her.  She also requests orthopedic shoes for her diabetes.  The history is provided by the patient.  Leg Pain Lower extremity pain location: both legs diffusely. Pain details:    Quality: pain.   Radiates to:  Does not radiate   Severity:  Moderate   Onset quality:  Gradual   Duration: chronic.   Timing:  Constant   Progression:  Unchanged Chronicity:  Chronic Prior injury to area:  No Relieved by:  Nothing Worsened by:  Activity Ineffective treatments:  None tried Associated symptoms: numbness   Associated symptoms: no back pain, no fever, no swelling and no tingling       Past Medical History:  Diagnosis Date   Diabetes mellitus without complication ([place])    GERD 09/04/2006   Qualifier: Diagnosis of  By: Suzie Portela     History of echocardiogram    a. 2D ECHO: 11/06/2013 EF 65%; no  WMA. Mild TR. PA pk pressure 43 mm HG   Hypertension    Stroke 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   Numbness 06/20/2012   Diabetes mellitus ([place]) 06/20/2012   CVA (cerebral infarction) 06/20/2012   Hypokalemia 06/20/2012   ARTHRALGIA 01/28/2009   CHEST PAIN UNSPECIFIED 11/26/2008   DEPRESSION 09/04/2006   Essential hypertension 09/04/2006   GERD 09/04/2006   MULTIPLE SITES, ARTHRITIS-ACUTE/CHRONIC 09/04/2006   LOW BACK PAIN, CHRONIC 09/04/2006    Past Surgical History:  Procedure Laterality Date   ABDOMINAL HYSTERECTOMY     ANTERIOR CERVICAL DECOMPRESSION/DISCECTOMY FUSION 4 LEVELS N/A 11/11/2019   Procedure: Cervical three-four Cervical four-five Cervical five-six Cervical six-seven  Anterior cervical decompression/discectomy/fusion;  Surgeon: Erline Levine, MD;  Location: [facility];  Service: Neurosurgery;  Laterality: N/A;   LEFT HEART CATHETERIZATION WITH CORONARY ANGIOGRAM N/A 11/05/2013   Procedure: LEFT HEART CATHETERIZATION WITH CORONARY ANGIOGRAM;  Surgeon: Marcello Moores  Floyce Stakes, MD;  Location: [facility] CATH LAB;  Service: Cardiovascular;  Laterality: N/A;     OB History   No obstetric history on file.     No family history on file.  Social History   Tobacco Use   Smoking status: Never   Smokeless tobacco: Never  Vaping Use   Vaping Use: Never used  Substance Use Topics   Alcohol use: No   Drug use: No    Home Medications Prior to Admission medications   Medication Sig Start Date End Date Taking? Authorizing Provider  acetaminophen (TYLENOL) 325 MG tablet Take 2 tablets (650 mg total) by  mouth every 6 (six) hours as needed for up to 30 doses for mild pain or moderate pain. 05/28/20   Wyvonnia Dusky, MD  albuterol (VENTOLIN HFA) 108 (90 Base) MCG/ACT inhaler Inhale 1-2 puffs into the lungs every 6 (six) hours as needed for wheezing or shortness of breath. 04/22/20   Vevelyn Francois, NP  amoxicillin (AMOXIL) 500 MG capsule Take 1 capsule (500 mg total) by mouth 2 (two) times daily. Patient not taking: Reported on 07/06/2020 06/29/20   Ripley Fraise, MD  aspirin EC 81 MG tablet Take 81 mg by mouth daily. Swallow whole.    [provider]  Blood Glucose Monitoring Suppl [facility] VERIO) w/Device KIT Use 1-4 times daily as needed/directed DX E11.9 04/25/20   Vevelyn Francois, NP  chlorthalidone (HYGROTON) 25 MG tablet Take 1 tablet (25 mg total) by mouth daily. 04/22/20   Vevelyn Francois, NP  cloNIDine (CATAPRES) 0.2 MG tablet Take 1 tablet (0.2 mg total) by mouth 2 (two) times daily. 12/10/19   Jaynee Eagles, PA-C  clopidogrel (PLAVIX) 75 MG tablet Take 1 tablet (75 mg total) by mouth daily. 04/22/20   Vevelyn Francois, NP  diclofenac Sodium (VOLTAREN) 1 % GEL Apply 2 g topically 4 (four) times daily as needed. 07/27/20   Quintella Reichert, MD  diphenhydramine-acetaminophen (TYLENOL PM) 25-500 MG TABS tablet Take 1 tablet by mouth at bedtime.    [provider]  gabapentin (NEURONTIN) 300 MG capsule Take 1 capsule (300 mg total) by mouth daily for 1 day, THEN 1 capsule (300 mg total) 2 (two) times daily for 1 day, THEN 1 capsule (300 mg total) 3 (three) times daily. 07/28/20 10/28/20  Arnaldo Natal, MD  glipiZIDE (GLUCOTROL) 5 MG tablet Take 1 tablet (5 mg total) by mouth 2 (two) times daily before a meal. 04/22/20 04/22/21  Vevelyn Francois, NP  hydrALAZINE (APRESOLINE) 50 MG tablet Take 1 tablet (50 mg total) by mouth 2 (two) times daily. 04/22/20   Vevelyn Francois, NP  Lancets [facility] ULTRASOFT) lancets Use 1-4 times daily as needed/directed  DX E11.9 04/25/20   Vevelyn Francois, NP   lidocaine (LIDODERM) 5 % Place 1 patch onto the skin daily. Remove & Discard patch within 12 hours or as directed by MD Patient not taking: Reported on 07/06/2020 04/22/20 04/22/21  Vevelyn Francois, NP  lidocaine (LIDODERM) 5 % Place 1 patch onto the skin daily. Remove & Discard patch within 12 hours or as directed by MD 07/21/20   Randal Buba, April, MD  losartan (COZAAR) 50 MG tablet Take 50 mg by mouth daily. 05/07/20   [provider]  metFORMIN (GLUCOPHAGE) 500 MG tablet Take 1.5 tablets (750 mg total) by mouth 2 (two) times daily with a meal. Patient taking differently: Take 1,000 mg by mouth 2 (two) times daily with a meal. 10/27/19  07/06/20  Veryl Speak, MD  methocarbamol (ROBAXIN) 500 MG tablet TAKE 1 TABLET BY MOUTH 4 TIMES DAILY. Patient taking differently: Take 500 mg by mouth in the morning and at bedtime. 05/09/20   Vevelyn Francois, NP  MYRBETRIQ 25 MG TB24 tablet Take 25 mg by mouth daily. 05/18/20   [provider]  Roma Schanz test strip Use 1-4 times daily as directed/needed   DX E11.9 04/25/20   Vevelyn Francois, NP  oxyCODONE (ROXICODONE) 5 MG immediate release tablet Take 1 tablet (5 mg total) by mouth every 6 (six) hours as needed for up to 4 doses for severe pain. 05/28/20   Wyvonnia Dusky, MD  potassium chloride (KLOR-CON) 10 MEQ tablet Take 1 tablet (10 mEq total) by mouth daily. 04/25/20   Noemi Chapel, MD  potassium chloride SA (KLOR-CON) 20 MEQ tablet Take 1 tablet (20 mEq total) by mouth 2 (two) times daily. 07/13/20   Fredia Sorrow, MD  rosuvastatin (CRESTOR) 10 MG tablet Take 1 tablet (10 mg total) by mouth at bedtime. 04/22/20 04/22/21  Vevelyn Francois, NP    Allergies    Codeine, Ibuprofen, and Imdur [isosorbide nitrate]  Review of Systems   Review of Systems  Constitutional:  Negative for chills and fever.  HENT:  Negative for ear pain and sore throat.   Eyes:  Negative for pain and visual disturbance.  Respiratory:  Negative for cough and shortness of  breath.   Cardiovascular:  Negative for chest pain and palpitations.  Gastrointestinal:  Negative for abdominal pain and vomiting.  Genitourinary:  Negative for dysuria and hematuria.  Musculoskeletal:  Negative for arthralgias and back pain.  Skin:  Negative for color change and rash.  Neurological:  Negative for seizures and syncope.  Psychiatric/Behavioral:  Positive for sleep disturbance.   All other systems reviewed and are negative.  Physical Exam Updated Vital Signs BP (!) 147/62   Pulse 62   Temp 97.8 F (36.6 C) (Oral)   Resp 14   SpO2 100%   Physical Exam Vitals and nursing note reviewed.  HENT:     Head: Normocephalic and atraumatic.  Eyes:     General: No scleral icterus. Pulmonary:     Effort: Pulmonary effort is normal. No respiratory distress.  Musculoskeletal:     Cervical back: Normal range of motion.     Right lower leg: No tenderness. No edema.     Left lower leg: No tenderness. No edema.     Comments: The lower legs are normal to inspection.  Dorsalis pedis and posterior tibialis pulses are palpable and symmetric.  No joint effusions in the knees, ankles, or toes.  Full range of motion of the knees and hips.  Skin:    General: Skin is warm and dry.  Neurological:     General: No focal deficit present.     Mental Status: She is alert.     Motor: No weakness.     Comments: Normal gait. Can walk unassisted  Psychiatric:        Mood and Affect: Mood normal.    ED Results / Procedures / Treatments   Labs (all labs ordered are listed, but only abnormal results are displayed) Labs Reviewed  BASIC METABOLIC PANEL - Abnormal; Notable for the following components:      Result Value   Glucose, Bld 181 (*)    BUN 27 (*)    Creatinine, Ser 1.42 (*)    GFR, Estimated 39 (*)  All other components within normal limits  CBC - Abnormal; Notable for the following components:   RBC 3.61 (*)    Hemoglobin 10.9 (*)    HCT 33.5 (*)    All other components  within normal limits  TROPONIN I (HIGH SENSITIVITY)  TROPONIN I (HIGH SENSITIVITY)    EKG EKG Interpretation  Date/Time:  Thursday July 28 2020 07:16:36 EDT Ventricular Rate:  80 PR Interval:  130 QRS Duration: 66 QT Interval:  372 QTC Calculation: 429 R Axis:   48 Text Interpretation: Normal sinus rhythm Normal ECG normal axis No acute ischemia Confirmed by Lorre Munroe ([id number]) on 07/28/2020 5:29:15 PM  Radiology DG Chest 2 View  Result Date: 07/28/2020 CLINICAL DATA:  Chest pain. EXAM: CHEST - 2 VIEW COMPARISON:  July 12, 2020. FINDINGS: The heart size and mediastinal contours are within normal limits. Both lungs are clear. The visualized skeletal structures are unremarkable. IMPRESSION: No active cardiopulmonary disease. Electronically Signed   By: Marijo Conception M.D.   On: 07/28/2020 08:07    Procedures Procedures   Medications Ordered in ED Medications - No data to display  ED Course  I have reviewed the triage vital signs and the nursing notes.  Pertinent labs & imaging results that were available during my care of the patient were reviewed by me and considered in my medical decision making (see chart for details).    MDM Rules/Calculators/A&P                          Benay Pomeroy presents for a variety of reasons.  She was actually seen in the ER yesterday.  From what I can gather today, she is mainly concerned about leg pain.  It looks like she was just prescribed gabapentin, and I have represcribed this medication at a slightly higher dose after a ramp in dosage.  I did advise her on the fact that it may create sleepiness.  I offered her IV fluids for her mild drop in GFR.  She declined and states that she will drink and eat when she leaves the ER.  I also offered her home health referral, as she declined this.  I did place a transition of care referral as it seems like accessibility to a primary care provider is key.  Otherwise, her work-up which is detailed above, was not  significant for acute, emergent pathology. Final Clinical Impression(s) / ED Diagnoses Final diagnoses:  Diabetic polyneuropathy associated with type 2 diabetes mellitus ([place])  Acute renal failure, unspecified acute renal failure type ([facility])  Anemia, unspecified type    Rx / DC Orders ED Discharge Orders          Ordered    gabapentin (NEURONTIN) 300 MG capsule        07/28/20 1747    Consult to Transition of Care Team       Comments: Her doctor left the practice. She needs a PCP accessible by bus.  Provider:  (Not yet assigned)   07/28/20 1749             Arnaldo Natal, MD 07/28/20 Carollee Massed

## 2020-07-28 NOTE — ED Triage Notes (Signed)
Patient here with complaint of central chest pain and headache that started today. Patient states she has had similar episodes in the past but does not recall what was determined to be the cause. Patient alert, oriented, and requesting food.

## 2020-08-01 ENCOUNTER — Emergency Department (HOSPITAL_COMMUNITY): Payer: Medicare Other

## 2020-08-01 ENCOUNTER — Emergency Department (HOSPITAL_COMMUNITY)
Admission: EM | Admit: 2020-08-01 | Discharge: 2020-08-02 | Disposition: A | Discharge status: Home / Self Care | Payer: Medicare Other | Attending: Emergency Medicine | Admitting: Emergency Medicine

## 2020-08-01 DIAGNOSIS — R519 Headache, unspecified: Secondary | ICD-10-CM | POA: Diagnosis not present

## 2020-08-01 DIAGNOSIS — M79672 Pain in left foot: Secondary | ICD-10-CM | POA: Insufficient documentation

## 2020-08-01 DIAGNOSIS — R079 Chest pain, unspecified: Secondary | ICD-10-CM

## 2020-08-01 DIAGNOSIS — Z7902 Long term (current) use of antithrombotics/antiplatelets: Secondary | ICD-10-CM | POA: Insufficient documentation

## 2020-08-01 DIAGNOSIS — Z79899 Other long term (current) drug therapy: Secondary | ICD-10-CM | POA: Insufficient documentation

## 2020-08-01 DIAGNOSIS — E119 Type 2 diabetes mellitus without complications: Secondary | ICD-10-CM | POA: Insufficient documentation

## 2020-08-01 DIAGNOSIS — M79671 Pain in right foot: Secondary | ICD-10-CM | POA: Diagnosis not present

## 2020-08-01 DIAGNOSIS — Z7984 Long term (current) use of oral hypoglycemic drugs: Secondary | ICD-10-CM | POA: Insufficient documentation

## 2020-08-01 DIAGNOSIS — Z955 Presence of coronary angioplasty implant and graft: Secondary | ICD-10-CM | POA: Diagnosis not present

## 2020-08-01 DIAGNOSIS — I160 Hypertensive urgency: Secondary | ICD-10-CM | POA: Diagnosis not present

## 2020-08-01 DIAGNOSIS — Z8616 Personal history of COVID-19: Secondary | ICD-10-CM | POA: Diagnosis not present

## 2020-08-01 DIAGNOSIS — Z7982 Long term (current) use of aspirin: Secondary | ICD-10-CM | POA: Diagnosis not present

## 2020-08-01 DIAGNOSIS — I10 Essential (primary) hypertension: Secondary | ICD-10-CM | POA: Insufficient documentation

## 2020-08-01 LAB — CBC
HCT: 35.2 % — ABNORMAL LOW (ref 36.0–46.0)
Hemoglobin: 11.4 g/dL — ABNORMAL LOW (ref 12.0–15.0)
MCH: 30.2 pg (ref 26.0–34.0)
MCHC: 32.4 g/dL (ref 30.0–36.0)
MCV: 93.4 fL (ref 80.0–100.0)
Platelets: 278 10*3/uL (ref 150–400)
RBC: 3.77 MIL/uL — ABNORMAL LOW (ref 3.87–5.11)
RDW: 12.2 % (ref 11.5–15.5)
WBC: 7.2 10*3/uL (ref 4.0–10.5)
nRBC: 0 % (ref 0.0–0.2)

## 2020-08-01 LAB — BASIC METABOLIC PANEL
Anion gap: 10 (ref 5–15)
BUN: 11 mg/dL (ref 8–23)
CO2: 28 mmol/L (ref 22–32)
Calcium: 9 mg/dL (ref 8.9–10.3)
Chloride: 100 mmol/L (ref 98–111)
Creatinine, Ser: 1.07 mg/dL — ABNORMAL HIGH (ref 0.44–1.00)
GFR, Estimated: 55 mL/min — ABNORMAL LOW (ref >60)
Glucose, Bld: 115 mg/dL — ABNORMAL HIGH (ref 70–99)
Potassium: 3.3 mmol/L — ABNORMAL LOW (ref 3.5–5.1)
Sodium: 138 mmol/L (ref 135–145)

## 2020-08-01 LAB — TROPONIN I (HIGH SENSITIVITY): Troponin I (High Sensitivity): 10 ng/L (ref <18)

## 2020-08-01 NOTE — ED Triage Notes (Signed)
Pt c/o headache at the base of the skull, states she's compliant w HTN medication. Endorses cramps all over & brief instance of CP on bus en route to ED.

## 2020-08-01 NOTE — ED Notes (Signed)
Triage rn notified of blood pressure ?

## 2020-08-01 NOTE — ED Provider Notes (Signed)
Emergency Medicine Provider Triage Evaluation Note  [name] , a 75 y.o. female  was evaluated in triage.  Pt complains of chest tightness. Pt states her tightness is intermittent and in the central portion of her chest. Currently 9/10. No SOB. Also notes that she has been experiencing a HA and body aches throughout the day today. States she has been compliant with her HTN medications.   Physical Exam  BP (!) 223/85 (BP Location: Left Arm)   Pulse 72   Temp 98.4 F (36.9 C)   Resp 16   SpO2 100%  Gen:   Awake, no distress   Resp:  Normal effort  MSK:   Moves extremities without difficulty  Other:    Medical Decision Making  Medically screening exam initiated at 9:13 PM.  Appropriate orders placed.  [name] was informed that the remainder of the evaluation will be completed by another provider, this initial triage assessment does not replace that evaluation, and the importance of remaining in the ED until their evaluation is complete.   Placido Sou, PA-C 08/01/20 2114    Milagros Loll, MD 08/02/20 [phone]

## 2020-08-02 ENCOUNTER — Telehealth: Payer: Self-pay | Admitting: Clinical

## 2020-08-02 DIAGNOSIS — I160 Hypertensive urgency: Secondary | ICD-10-CM | POA: Diagnosis not present

## 2020-08-02 LAB — TROPONIN I (HIGH SENSITIVITY): Troponin I (High Sensitivity): 11 ng/L (ref <18)

## 2020-08-02 MED ORDER — ACETAMINOPHEN 500 MG PO TABS
1000.0000 mg | ORAL_TABLET | Freq: Once | ORAL | Status: AC
  Administered 2020-08-02: 1000 mg via ORAL
  Filled 2020-08-02: qty 2

## 2020-08-02 MED ORDER — CLONIDINE HCL 0.2 MG PO TABS
0.2000 mg | ORAL_TABLET | Freq: Once | ORAL | Status: AC
  Administered 2020-08-02: 0.2 mg via ORAL
  Filled 2020-08-02: qty 1

## 2020-08-02 NOTE — Discharge Instructions (Addendum)
As discussed, your evaluation today has been largely reassuring.  But, it is important that you monitor your condition carefully, and do not hesitate to return to the ED if you develop new, or concerning changes in your condition. ? ?Otherwise, please follow-up with your physician for appropriate ongoing care. ? ?

## 2020-08-02 NOTE — Telephone Encounter (Signed)
[facility] General Follow Up Note  08/02/2020 Name: [name] MRN: [id number] DOB: 04-10-1945 [name] is a 74 y.o. year old female who sees Barbette Merino, NP for primary care. LCSW was initially consulted to assist with scheduling patient to see PCP.   Interpreter: No.   Interpreter Name & Language: none  Assessment: Patient experiencing frequent ED visits. ED case manager advised CSW that patient may need assistance scheduling with her PCP here at our clinic.   Ongoing Intervention: Today CSW called patient and helped patient coordinate with the front desk to schedule an appointment with PCP. An appointment was offered for tomorrow due to a cancellation, but patient declined. Next appointment not available until September. Scheduled appointment and advised patient that she can use [facility] [employer] service to come to the appointment. Patient asked for a reminder closer to the appointment. CSW will plan to make a reminder call.   Review of patient status, including review of consultants reports, relevant laboratory and other test results, and collaboration with appropriate care team members and the patient's provider was performed as part of comprehensive patient evaluation and provision of services.    Abigail Butts, LCSW [facility] [phone]

## 2020-08-02 NOTE — Care Management (Signed)
ED RNCM received consult from EDP concerning this patient's frequent ED encounters, and the need for care coordination/ TCM.  Spoke with patient last week  concerning the importance of PCP follow up. Patient is elderly and needs assistance with arranging appts  and transportation for follow up.  I reached out to her Primary Care office and sent message to Abigail Butts CSW  to follow up with patient to assist with barriers to care.   ED RNCM will reach out to Thad Ranger NP at the [facility].   Michel Bickers RN, BSN CNOR ED Care Manager [phone]

## 2020-08-02 NOTE — ED Notes (Signed)
Pt refusing Covid swab. Pt states she wants a referral to a dentist which was provided last visit but has lost the paperwork. Pt also wants post surgical shoes to help her feet not hurt so bad. Pt states they are very swollen.

## 2020-08-02 NOTE — ED Provider Notes (Addendum)
[facility] EMERGENCY DEPARTMENT Provider Note   CSN: [id number] Arrival date & time: 08/01/20  2051     History Chief Complaint  Patient presents with   Headache   Chest Pain    [name] is a 75 y.o. female.  HPI Patient presents from home with concern for foot pain.  Initial triage note suggest chest pain, but the patient denies any current chest pain.  Patient has been here 18 times in the past 6 months, frequently for bilateral foot pain.  Today she notes that she is not having relief with Tylenol, has no loss of sensation, no fever. Patient also complains of losing her recent discharge paperwork which included a referral for a dental evaluation.  Patient is tangential as a historian, but it seems as though her primary concerns are foot pain and need for a dentist.  No chest pain, no dyspnea. She states that she has been taking her medication as directed.   She no longer has a primary care physician as her practitioner has left the area.    Past Medical History:  Diagnosis Date   Diabetes mellitus without complication ([place])    GERD 09/04/2006   Qualifier: Diagnosis of  By: Suzie Portela     History of echocardiogram    a. 2D ECHO: 11/06/2013 EF 65%; no WMA. Mild TR. 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   Numbness 06/20/2012   Diabetes mellitus ([place]) 06/20/2012   CVA (cerebral infarction) 06/20/2012   Hypokalemia 06/20/2012   ARTHRALGIA 01/28/2009   CHEST PAIN UNSPECIFIED 11/26/2008   DEPRESSION 09/04/2006    Essential hypertension 09/04/2006   GERD 09/04/2006   MULTIPLE SITES, ARTHRITIS-ACUTE/CHRONIC 09/04/2006   LOW BACK PAIN, CHRONIC 09/04/2006    Past Surgical History:  Procedure Laterality Date   ABDOMINAL HYSTERECTOMY     ANTERIOR CERVICAL DECOMPRESSION/DISCECTOMY FUSION 4 LEVELS N/A 11/11/2019   Procedure: Cervical three-four Cervical four-five Cervical five-six Cervical six-seven  Anterior cervical decompression/discectomy/fusion;  Surgeon: Erline Levine, MD;  Location: [place];  Service: Neurosurgery;  Laterality: N/A;   LEFT HEART CATHETERIZATION WITH CORONARY ANGIOGRAM N/A 11/05/2013   Procedure: LEFT HEART CATHETERIZATION WITH CORONARY ANGIOGRAM;  Surgeon: Troy Sine, MD;  Location: [facility] CATH LAB;  Service: Cardiovascular;  Laterality: N/A;     OB History   No obstetric history on file.     No family history on file.  Social History   Tobacco Use   Smoking status: Never   Smokeless tobacco: Never  Vaping Use   Vaping Use: Never used  Substance Use Topics   Alcohol use: No   Drug use: No    Home Medications Prior to Admission medications   Medication Sig Start Date End Date Taking? Authorizing Provider  acetaminophen (TYLENOL) 325 MG tablet Take 2 tablets (650 mg total) by mouth every 6 (six) hours as needed for up to 30 doses for mild pain or moderate pain. 05/28/20   Wyvonnia Dusky, MD  albuterol (VENTOLIN HFA) 108 (90 Base) MCG/ACT inhaler Inhale 1-2 puffs into the  lungs every 6 (six) hours as needed for wheezing or shortness of breath. 04/22/20   Vevelyn Francois, NP  amoxicillin (AMOXIL) 500 MG capsule Take 1 capsule (500 mg total) by mouth 2 (two) times daily. Patient not taking: Reported on 07/06/2020 06/29/20   Ripley Fraise, MD  aspirin EC 81 MG tablet Take 81 mg by mouth daily. Swallow whole.    [provider]  Blood Glucose Monitoring Suppl [facility] VERIO) w/Device KIT Use 1-4 times daily as needed/directed DX E11.9 04/25/20   Vevelyn Francois, NP   chlorthalidone (HYGROTON) 25 MG tablet Take 1 tablet (25 mg total) by mouth daily. 04/22/20   Vevelyn Francois, NP  cloNIDine (CATAPRES) 0.2 MG tablet Take 1 tablet (0.2 mg total) by mouth 2 (two) times daily. 12/10/19   Jaynee Eagles, PA-C  clopidogrel (PLAVIX) 75 MG tablet Take 1 tablet (75 mg total) by mouth daily. 04/22/20   Vevelyn Francois, NP  diclofenac Sodium (VOLTAREN) 1 % GEL Apply 2 g topically 4 (four) times daily as needed. 07/27/20   Quintella Reichert, MD  diphenhydramine-acetaminophen (TYLENOL PM) 25-500 MG TABS tablet Take 1 tablet by mouth at bedtime.    [provider]  gabapentin (NEURONTIN) 300 MG capsule Take 1 capsule (300 mg total) by mouth daily for 1 day, THEN 1 capsule (300 mg total) 2 (two) times daily for 1 day, THEN 1 capsule (300 mg total) 3 (three) times daily. 07/28/20 10/28/20  Arnaldo Natal, MD  glipiZIDE (GLUCOTROL) 5 MG tablet Take 1 tablet (5 mg total) by mouth 2 (two) times daily before a meal. 04/22/20 04/22/21  Vevelyn Francois, NP  hydrALAZINE (APRESOLINE) 50 MG tablet Take 1 tablet (50 mg total) by mouth 2 (two) times daily. 04/22/20   Vevelyn Francois, NP  Lancets [facility] ULTRASOFT) lancets Use 1-4 times daily as needed/directed  DX E11.9 04/25/20   Vevelyn Francois, NP  lidocaine (LIDODERM) 5 % Place 1 patch onto the skin daily. Remove & Discard patch within 12 hours or as directed by MD Patient not taking: Reported on 07/06/2020 04/22/20 04/22/21  Vevelyn Francois, NP  lidocaine (LIDODERM) 5 % Place 1 patch onto the skin daily. Remove & Discard patch within 12 hours or as directed by MD 07/21/20   Randal Buba, April, MD  losartan (COZAAR) 50 MG tablet Take 50 mg by mouth daily. 05/07/20   [provider]  metFORMIN (GLUCOPHAGE) 500 MG tablet Take 1.5 tablets (750 mg total) by mouth 2 (two) times daily with a meal. Patient taking differently: Take 1,000 mg by mouth 2 (two) times daily with a meal. 10/27/19 07/06/20  Veryl Speak, MD  methocarbamol (ROBAXIN) 500 MG tablet  TAKE 1 TABLET BY MOUTH 4 TIMES DAILY. Patient taking differently: Take 500 mg by mouth in the morning and at bedtime. 05/09/20   Vevelyn Francois, NP  MYRBETRIQ 25 MG TB24 tablet Take 25 mg by mouth daily. 05/18/20   [provider]  Roma Schanz test strip Use 1-4 times daily as directed/needed   DX E11.9 04/25/20   Vevelyn Francois, NP  oxyCODONE (ROXICODONE) 5 MG immediate release tablet Take 1 tablet (5 mg total) by mouth every 6 (six) hours as needed for up to 4 doses for severe pain. 05/28/20   Wyvonnia Dusky, MD  potassium chloride (KLOR-CON) 10 MEQ tablet Take 1 tablet (10 mEq total) by mouth daily. 04/25/20   Noemi Chapel, MD  potassium chloride SA (KLOR-CON) 20 MEQ tablet Take  1 tablet (20 mEq total) by mouth 2 (two) times daily. 07/13/20   Fredia Sorrow, MD  rosuvastatin (CRESTOR) 10 MG tablet Take 1 tablet (10 mg total) by mouth at bedtime. 04/22/20 04/22/21  Vevelyn Francois, NP    Allergies    Codeine, Ibuprofen, and Imdur [isosorbide nitrate]  Review of Systems   Review of Systems  Constitutional:        Per HPI, otherwise negative  HENT:         Per HPI, otherwise negative  Respiratory:         Per HPI, otherwise negative  Cardiovascular:        Per HPI, otherwise negative  Gastrointestinal:  Negative for vomiting.  Endocrine:       Negative aside from HPI  Genitourinary:        Neg aside from HPI   Musculoskeletal:        Per HPI, otherwise negative  Skin: Negative.   Neurological:  Negative for syncope and weakness.   Physical Exam Updated Vital Signs BP (!) 204/73   Pulse 76   Temp 98.4 F (36.9 C)   Resp 18   Ht $R'5\' 5"'Qx$  (1.651 m)   Wt 68 kg   SpO2 94%   BMI 24.95 kg/m   Physical Exam Vitals and nursing note reviewed.  Constitutional:      General: She is not in acute distress.    Appearance: She is well-developed.  HENT:     Head: Normocephalic and atraumatic.  Eyes:     Conjunctiva/sclera: Conjunctivae normal.  Cardiovascular:     Rate and  Rhythm: Normal rate and regular rhythm.  Pulmonary:     Effort: Pulmonary effort is normal. No respiratory distress.     Breath sounds: Normal breath sounds. No stridor.  Abdominal:     General: There is no distension.  Musculoskeletal:     Comments: Bilateral feet unremarkable  Skin:    General: Skin is warm and dry.  Neurological:     Mental Status: She is alert and oriented to person, place, and time.     Cranial Nerves: No cranial nerve deficit.  Psychiatric:     Comments: Little insight into her presentation.    ED Results / Procedures / Treatments   Labs (all labs ordered are listed, but only abnormal results are displayed) Labs Reviewed  BASIC METABOLIC PANEL - Abnormal; Notable for the following components:      Result Value   Potassium 3.3 (*)    Glucose, Bld 115 (*)    Creatinine, Ser 1.07 (*)    GFR, Estimated 55 (*)    All other components within normal limits  CBC - Abnormal; Notable for the following components:   RBC 3.77 (*)    Hemoglobin 11.4 (*)    HCT 35.2 (*)    All other components within normal limits  RESP PANEL BY RT-PCR (FLU A&B, COVID) ARPGX2  TROPONIN I (HIGH SENSITIVITY)  TROPONIN I (HIGH SENSITIVITY)    EKG Sinus rhythm, 70, short PR.  Radiology DG Chest 1 View  Result Date: 08/01/2020 CLINICAL DATA:  Headache, cramping and chest pain EXAM: CHEST  1 VIEW COMPARISON:  Radiograph 07/28/2020, 11/27/2019 FINDINGS: Chronic bronchitic features and coarsened interstitial changes. Some focal scarring noted in the right lung base as well. No consolidation, features of edema, pneumothorax, or effusion. Pulmonary vascularity is normally distributed. The cardiomediastinal contours are unremarkable. No acute osseous or soft tissue abnormality. Cervical fusion hardware partly visualized. Degenerative changes  are present in the imaged spine and shoulders. IMPRESSION: No acute cardiopulmonary abnormality. Electronically Signed   By: Lovena Le M.D.   On:  08/01/2020 21:56    Procedures Procedures   Medications Ordered in ED Medications  acetaminophen (TYLENOL) tablet 1,000 mg (1,000 mg Oral Given 08/02/20 0905)  cloNIDine (CATAPRES) tablet 0.2 mg (0.2 mg Oral Given 08/02/20 0312)    ED Course  I have reviewed the triage vital signs and the nursing notes.  Pertinent labs & imaging results that were available during my care of the patient were reviewed by me and considered in my medical decision making (see chart for details).   1:26 PM Patient in no distress.  Reviewing the patient's chart is clear that she has multiple visits for similar presentation here, no notable findings, reassuring x-ray, reassuring labs, blood pressure has improved to reasonable level after she was provided her home medication, low suspicion for hypertensive emergency, no evidence for endorgan damage.  Patient discharged with social worker contacting her for additional resources as needed.  Final Clinical Impression(s) / ED Diagnoses Final diagnoses:  Hypertensive urgency  Pain in both feet     Carmin Muskrat, MD 08/02/20 1328    Carmin Muskrat, MD 08/02/20 1330

## 2020-08-07 ENCOUNTER — Encounter (HOSPITAL_COMMUNITY): Payer: Self-pay | Admitting: Emergency Medicine

## 2020-08-07 ENCOUNTER — Other Ambulatory Visit: Payer: Self-pay

## 2020-08-07 ENCOUNTER — Emergency Department (HOSPITAL_COMMUNITY)
Admission: EM | Admit: 2020-08-07 | Discharge: 2020-08-07 | Disposition: A | Discharge status: Home / Self Care | Payer: Medicare Other | Attending: Emergency Medicine | Admitting: Emergency Medicine

## 2020-08-07 DIAGNOSIS — Z8616 Personal history of COVID-19: Secondary | ICD-10-CM | POA: Insufficient documentation

## 2020-08-07 DIAGNOSIS — Z79899 Other long term (current) drug therapy: Secondary | ICD-10-CM | POA: Diagnosis not present

## 2020-08-07 DIAGNOSIS — K0889 Other specified disorders of teeth and supporting structures: Secondary | ICD-10-CM

## 2020-08-07 DIAGNOSIS — E119 Type 2 diabetes mellitus without complications: Secondary | ICD-10-CM | POA: Insufficient documentation

## 2020-08-07 DIAGNOSIS — Z7902 Long term (current) use of antithrombotics/antiplatelets: Secondary | ICD-10-CM | POA: Diagnosis not present

## 2020-08-07 DIAGNOSIS — Z7984 Long term (current) use of oral hypoglycemic drugs: Secondary | ICD-10-CM | POA: Insufficient documentation

## 2020-08-07 DIAGNOSIS — I10 Essential (primary) hypertension: Secondary | ICD-10-CM | POA: Insufficient documentation

## 2020-08-07 DIAGNOSIS — Z7982 Long term (current) use of aspirin: Secondary | ICD-10-CM | POA: Insufficient documentation

## 2020-08-07 MED ORDER — HYDROCODONE-ACETAMINOPHEN 5-325 MG PO TABS
1.0000 | ORAL_TABLET | Freq: Once | ORAL | Status: AC
  Administered 2020-08-07: 1 via ORAL
  Filled 2020-08-07: qty 1

## 2020-08-07 MED ORDER — PENICILLIN V POTASSIUM 500 MG PO TABS: 500.0000 mg | ORAL_TABLET | Freq: Four times a day (QID) | ORAL | 0 refills | Status: AC

## 2020-08-07 MED ORDER — PENICILLIN V POTASSIUM 250 MG PO TABS
500.0000 mg | ORAL_TABLET | Freq: Once | ORAL | Status: AC
  Administered 2020-08-07: 500 mg via ORAL
  Filled 2020-08-07: qty 2

## 2020-08-07 NOTE — ED Provider Notes (Signed)
[facility] EMERGENCY DEPARTMENT Provider Note   CSN: [id number] Arrival date & time: 08/07/20  1319     History Chief Complaint  Patient presents with   Dental Pain    [name] is a 75 y.o. female with PMH/o DM, GERD, HTN who presents for evaluation of dental pain.  She reports that she has had dental pain that has been ongoing "a long time."  She feels like that it is worsened in the last week or so.  She reports particularly where she has a tooth chipped on the left upper side, and has been causing her more pain.  She is post to see a dentist but she states that she has not been able to go see him yet.  She states that the pain was getting worse, prompting ED visit.  She feels like the pain radiates into her face.  No facial swelling, difficulty swallowing, difficulty breathing.  She has been able to tolerate p.o.  No fevers. She denies any CP, SOB.   The history is provided by the patient.      Past Medical History:  Diagnosis Date   Diabetes mellitus without complication ([place])    GERD 09/04/2006   Qualifier: Diagnosis of  By: Suzie Portela     History of echocardiogram    a. 2D ECHO: 11/06/2013 EF 65%; no WMA. Mild TR. 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   Numbness 06/20/2012   Diabetes mellitus ([place]) 06/20/2012   CVA (cerebral infarction) 06/20/2012   Hypokalemia 06/20/2012   ARTHRALGIA 01/28/2009   CHEST PAIN UNSPECIFIED 11/26/2008   DEPRESSION 09/04/2006   Essential hypertension 09/04/2006   GERD 09/04/2006    MULTIPLE SITES, ARTHRITIS-ACUTE/CHRONIC 09/04/2006   LOW BACK PAIN, CHRONIC 09/04/2006    Past Surgical History:  Procedure Laterality Date   ABDOMINAL HYSTERECTOMY     ANTERIOR CERVICAL DECOMPRESSION/DISCECTOMY FUSION 4 LEVELS N/A 11/11/2019   Procedure: Cervical three-four Cervical four-five Cervical five-six Cervical six-seven  Anterior cervical decompression/discectomy/fusion;  Surgeon: Erline Levine, MD;  Location: [place];  Service: Neurosurgery;  Laterality: N/A;   LEFT HEART CATHETERIZATION WITH CORONARY ANGIOGRAM N/A 11/05/2013   Procedure: LEFT HEART CATHETERIZATION WITH CORONARY ANGIOGRAM;  Surgeon: Troy Sine, MD;  Location: [facility] CATH LAB;  Service: Cardiovascular;  Laterality: N/A;     OB History   No obstetric history on file.     No family history on file.  Social History   Tobacco Use   Smoking status: Never   Smokeless tobacco: Never  Vaping Use   Vaping Use: Never used  Substance Use Topics   Alcohol use: No   Drug use: No    Home Medications Prior to Admission medications   Medication Sig Start Date End Date Taking? Authorizing Provider  penicillin v potassium (VEETID) 500 MG tablet Take 1 tablet (500 mg total) by mouth 4 (four) times daily for 7 days. 08/07/20 08/14/20 Yes Volanda Napoleon, PA-C  acetaminophen (TYLENOL) 325 MG tablet Take 2 tablets (650 mg total) by mouth every 6 (six) hours  as needed for up to 30 doses for mild pain or moderate pain. 05/28/20   Wyvonnia Dusky, MD  albuterol (VENTOLIN HFA) 108 (90 Base) MCG/ACT inhaler Inhale 1-2 puffs into the lungs every 6 (six) hours as needed for wheezing or shortness of breath. 04/22/20   Vevelyn Francois, NP  aspirin EC 81 MG tablet Take 81 mg by mouth daily. Swallow whole.    [provider]  Blood Glucose Monitoring Suppl (ONETOUCH VERIO) w/Device KIT Use 1-4 times daily as needed/directed DX E11.9 Patient not taking: No sig reported 04/25/20   Vevelyn Francois, NP  chlorthalidone (HYGROTON) 25  MG tablet Take 1 tablet (25 mg total) by mouth daily. 04/22/20   Vevelyn Francois, NP  cloNIDine (CATAPRES) 0.2 MG tablet Take 1 tablet (0.2 mg total) by mouth 2 (two) times daily. 12/10/19   Jaynee Eagles, PA-C  clopidogrel (PLAVIX) 75 MG tablet Take 1 tablet (75 mg total) by mouth daily. 04/22/20   Vevelyn Francois, NP  diclofenac Sodium (VOLTAREN) 1 % GEL Apply 2 g topically 4 (four) times daily as needed. 07/27/20   Quintella Reichert, MD  diphenhydramine-acetaminophen (TYLENOL PM) 25-500 MG TABS tablet Take 1 tablet by mouth at bedtime.    [provider]  gabapentin (NEURONTIN) 300 MG capsule Take 1 capsule (300 mg total) by mouth daily for 1 day, THEN 1 capsule (300 mg total) 2 (two) times daily for 1 day, THEN 1 capsule (300 mg total) 3 (three) times daily. 07/28/20 10/28/20  Arnaldo Natal, MD  glipiZIDE (GLUCOTROL) 5 MG tablet Take 1 tablet (5 mg total) by mouth 2 (two) times daily before a meal. 04/22/20 04/22/21  Vevelyn Francois, NP  hydrALAZINE (APRESOLINE) 50 MG tablet Take 1 tablet (50 mg total) by mouth 2 (two) times daily. 04/22/20   Vevelyn Francois, NP  Lancets [facility] ULTRASOFT) lancets Use 1-4 times daily as needed/directed  DX E11.9 Patient taking differently: 1 each by Other route See admin instructions. Use 1-4 times daily as needed/directed  DX E11.9 04/25/20   Vevelyn Francois, NP  lidocaine (LIDODERM) 5 % Place 1 patch onto the skin daily. Remove & Discard patch within 12 hours or as directed by MD Patient not taking: No sig reported 04/22/20 04/22/21  Vevelyn Francois, NP  lidocaine (LIDODERM) 5 % Place 1 patch onto the skin daily. Remove & Discard patch within 12 hours or as directed by MD 07/21/20   Randal Buba, April, MD  losartan (COZAAR) 50 MG tablet Take 50 mg by mouth daily. 05/07/20   [provider]  metFORMIN (GLUCOPHAGE) 500 MG tablet Take 1.5 tablets (750 mg total) by mouth 2 (two) times daily with a meal. Patient taking differently: Take 1,000 mg by mouth 2 (two) times daily  with a meal. 10/27/19 07/06/20  Veryl Speak, MD  methocarbamol (ROBAXIN) 500 MG tablet TAKE 1 TABLET BY MOUTH 4 TIMES DAILY. Patient taking differently: Take 500 mg by mouth in the morning and at bedtime. 05/09/20   Vevelyn Francois, NP  MYRBETRIQ 25 MG TB24 tablet Take 25 mg by mouth daily. 05/18/20   [provider]  [facility] VERIO test strip Use 1-4 times daily as directed/needed   DX E11.9 Patient taking differently: 1 each by Other route See admin instructions. Use 1-4 times daily as directed/needed   DX E11.9 04/25/20   Vevelyn Francois, NP  oxyCODONE (ROXICODONE) 5 MG immediate release tablet Take 1 tablet (5 mg total) by mouth every  6 (six) hours as needed for up to 4 doses for severe pain. 05/28/20   Wyvonnia Dusky, MD  potassium chloride (KLOR-CON) 10 MEQ tablet Take 1 tablet (10 mEq total) by mouth daily. 04/25/20   Noemi Chapel, MD  potassium chloride SA (KLOR-CON) 20 MEQ tablet Take 1 tablet (20 mEq total) by mouth 2 (two) times daily. 07/13/20   Fredia Sorrow, MD  rosuvastatin (CRESTOR) 10 MG tablet Take 1 tablet (10 mg total) by mouth at bedtime. 04/22/20 04/22/21  Vevelyn Francois, NP    Allergies    Codeine, Ibuprofen, and Imdur [isosorbide nitrate]  Review of Systems   Review of Systems  Constitutional:  Negative for fever.  HENT:  Positive for dental problem. Negative for facial swelling and trouble swallowing.   Respiratory:  Negative for shortness of breath.   Gastrointestinal:  Negative for vomiting.  All other systems reviewed and are negative.  Physical Exam Updated Vital Signs BP (!) 170/69 (BP Location: Right Arm)   Pulse 70   Temp 97.9 F (36.6 C) (Oral)   Resp 16   SpO2 98%   Physical Exam Vitals and nursing note reviewed.  Constitutional:      Appearance: She is well-developed.  HENT:     Head: Normocephalic and atraumatic.     Comments: Face is symmetric in appearance without any overlying warmth, erythema, edema.     Mouth/Throat:      Comments:  No dental abscess. Airway is patent, phonation is intact. Uvula is midline.  No trismus. Eyes:     General: No scleral icterus.       Right eye: No discharge.        Left eye: No discharge.     Conjunctiva/sclera: Conjunctivae normal.  Pulmonary:     Effort: Pulmonary effort is normal.     Comments: Lungs clear to auscultation bilaterally.  Symmetric chest rise.  No wheezing, rales, rhonchi. Skin:    General: Skin is warm and dry.  Neurological:     Mental Status: She is alert.  Psychiatric:        Speech: Speech normal.        Behavior: Behavior normal.    ED Results / Procedures / Treatments   Labs (all labs ordered are listed, but only abnormal results are displayed) Labs Reviewed - No data to display  EKG None  Radiology No results found.  Procedures Procedures   Medications Ordered in ED Medications  penicillin v potassium (VEETID) tablet 500 mg (500 mg Oral Given 08/07/20 1544)  HYDROcodone-acetaminophen (NORCO/VICODIN) 5-325 MG per tablet 1 tablet (1 tablet Oral Given 08/07/20 1543)    ED Course  I have reviewed the triage vital signs and the nursing notes.  Pertinent labs & imaging results that were available during my care of the patient were reviewed by me and considered in my medical decision making (see chart for details).    MDM Rules/Calculators/A&P                          75 yo F presents with 1 week of dental pain. No evidence of abscess requiring immediate incision and drainage. Exam not concerning for Ludwig's angina or pharyngeal abscess. Patient is afebrile, non-toxic appearing, sitting comfortably on examination table. Vital signs reviewed. She is slightly hypertensive. Likely secondary to pain. Will treat with penicillin. Patient instructed to follow-up with dentist referral provided. At this time, patient exhibits no emergent life-threatening condition that require further  evaluation in ED. Stable for discharge at this time. Strict return  precautions discussed. Patient expresses understanding and agreement to plan.    Portions of this note were generated with [employer]. Dictation errors may occur despite best attempts at proofreading.   Final Clinical Impression(s) / ED Diagnoses Final diagnoses:  Pain, dental    Rx / DC Orders ED Discharge Orders          Ordered    penicillin v potassium (VEETID) 500 MG tablet  4 times daily        08/07/20 1621             Volanda Napoleon, PA-C 08/07/20 2315    Wyvonnia Dusky, MD 08/08/20 1106

## 2020-08-07 NOTE — Discharge Instructions (Signed)
Take antibiotics as directed. Please take all of your antibiotics until finished.  You can take Tylenol or Ibuprofen as directed for pain. You can alternate Tylenol and Ibuprofen every 4 hours. If you take Tylenol at 1pm, then you can take Ibuprofen at 5pm. Then you can take Tylenol again at 9pm.   The exam and treatment you received today has been provided on an emergency basis only. This is not a substitute for complete medical or dental care. If your problem worsens or new symptoms (problems) appear, and you are unable to arrange prompt follow-up care with your dentist, call or return to this location. If you do not have a dentist, please follow-up with one on the list provided  CALL YOUR DENTIST OR RETURN IMMEDIATELY IF you develop a fever, rash, difficulty breathing or swallowing, neck or facial swelling, or other potentially serious concerns.   Please follow-up with one of the dental clinics provided to you below or in your paperwork. Call and tell them you were seen in the Emergency Dept and arrange for an appointment. You may have to call multiple places in order to find a place to be seen.  Dental Assistance If the dentist on-call cannot see you, please use the resources below:   Patients with Medicaid: [facility] [address], [phone], [phone]  If unable to pay, or uninsured, contact [facility] ([phone]) or [facility] ([phone] in [place], [phone] in [place]) to become qualified for the adult dental clinic  Other Low-Cost Community Dental Services: [facility]- [address], [place], [place], [address]    [phone], Ext. [phone]    2nd and 4th Thursday of the month at 6:30am    10 clients each day by appointment, can sometimes see walk-in     patients if someone does not show for an appointment [facility]- [address], [place], [place], [address]    [phone]  [facility]- [address], [place], [place], [address]    [phone]  [facility]- [phone] [facility]- [phone] [facility]- [phone]  

## 2020-08-07 NOTE — ED Triage Notes (Signed)
Pt from home, complaint of dental pain, supposed to go to dentist but has not been yet

## 2020-08-10 ENCOUNTER — Emergency Department (HOSPITAL_COMMUNITY): Payer: Medicare Other

## 2020-08-10 ENCOUNTER — Emergency Department (HOSPITAL_COMMUNITY)
Admission: EM | Admit: 2020-08-10 | Discharge: 2020-08-11 | Disposition: A | Discharge status: Home / Self Care | Payer: Medicare Other | Attending: Emergency Medicine | Admitting: Emergency Medicine

## 2020-08-10 ENCOUNTER — Other Ambulatory Visit: Payer: Self-pay

## 2020-08-10 ENCOUNTER — Encounter (HOSPITAL_COMMUNITY): Payer: Self-pay | Admitting: Emergency Medicine

## 2020-08-10 DIAGNOSIS — E119 Type 2 diabetes mellitus without complications: Secondary | ICD-10-CM | POA: Diagnosis not present

## 2020-08-10 DIAGNOSIS — R0602 Shortness of breath: Secondary | ICD-10-CM | POA: Diagnosis not present

## 2020-08-10 DIAGNOSIS — Z7982 Long term (current) use of aspirin: Secondary | ICD-10-CM | POA: Insufficient documentation

## 2020-08-10 DIAGNOSIS — Z7902 Long term (current) use of antithrombotics/antiplatelets: Secondary | ICD-10-CM | POA: Diagnosis not present

## 2020-08-10 DIAGNOSIS — Z7984 Long term (current) use of oral hypoglycemic drugs: Secondary | ICD-10-CM | POA: Insufficient documentation

## 2020-08-10 DIAGNOSIS — M79605 Pain in left leg: Secondary | ICD-10-CM | POA: Insufficient documentation

## 2020-08-10 DIAGNOSIS — Z8616 Personal history of COVID-19: Secondary | ICD-10-CM | POA: Insufficient documentation

## 2020-08-10 DIAGNOSIS — Z79899 Other long term (current) drug therapy: Secondary | ICD-10-CM | POA: Diagnosis not present

## 2020-08-10 DIAGNOSIS — M79604 Pain in right leg: Secondary | ICD-10-CM | POA: Diagnosis not present

## 2020-08-10 DIAGNOSIS — R0789 Other chest pain: Secondary | ICD-10-CM | POA: Insufficient documentation

## 2020-08-10 DIAGNOSIS — I10 Essential (primary) hypertension: Secondary | ICD-10-CM | POA: Insufficient documentation

## 2020-08-10 DIAGNOSIS — M5431 Sciatica, right side: Secondary | ICD-10-CM | POA: Diagnosis not present

## 2020-08-10 DIAGNOSIS — I639 Cerebral infarction, unspecified: Secondary | ICD-10-CM | POA: Diagnosis not present

## 2020-08-10 DIAGNOSIS — R079 Chest pain, unspecified: Secondary | ICD-10-CM | POA: Diagnosis not present

## 2020-08-10 LAB — BASIC METABOLIC PANEL
Anion gap: 11 (ref 5–15)
BUN: 20 mg/dL (ref 8–23)
CO2: 23 mmol/L (ref 22–32)
Calcium: 8.9 mg/dL (ref 8.9–10.3)
Chloride: 102 mmol/L (ref 98–111)
Creatinine, Ser: 1.24 mg/dL — ABNORMAL HIGH (ref 0.44–1.00)
GFR, Estimated: 46 mL/min — ABNORMAL LOW (ref >60)
Glucose, Bld: 115 mg/dL — ABNORMAL HIGH (ref 70–99)
Potassium: 3.4 mmol/L — ABNORMAL LOW (ref 3.5–5.1)
Sodium: 136 mmol/L (ref 135–145)

## 2020-08-10 LAB — TROPONIN I (HIGH SENSITIVITY): Troponin I (High Sensitivity): 7 ng/L (ref <18)

## 2020-08-10 LAB — CBC
HCT: 35.4 % — ABNORMAL LOW (ref 36.0–46.0)
Hemoglobin: 11.5 g/dL — ABNORMAL LOW (ref 12.0–15.0)
MCH: 30.6 pg (ref 26.0–34.0)
MCHC: 32.5 g/dL (ref 30.0–36.0)
MCV: 94.1 fL (ref 80.0–100.0)
Platelets: 285 10*3/uL (ref 150–400)
RBC: 3.76 MIL/uL — ABNORMAL LOW (ref 3.87–5.11)
RDW: 12.7 % (ref 11.5–15.5)
WBC: 8.1 10*3/uL (ref 4.0–10.5)
nRBC: 0 % (ref 0.0–0.2)

## 2020-08-10 NOTE — ED Provider Notes (Signed)
Emergency Medicine Provider Triage Evaluation Note  [name] , a 75 y.o. female  was evaluated in triage.  Pt complains of chest pain and bilateral leg pain for the last week.  Review of Systems  Positive: Chest pain, leg pain Negative: Shortness of breath, cough, lower extremity edema, nausea, vomiting  Physical Exam  BP (!) 131/95 (BP Location: Left Arm)   Pulse 79   Temp 98.3 F (36.8 C)   Resp 16   SpO2 98%  Gen:   Awake, no distress   Resp:  Normal effort  MSK:   Moves extremities without difficulty  Other:    Medical Decision Making  Medically screening exam initiated at 8:24 PM.  Appropriate orders placed.  [name] was informed that the remainder of the evaluation will be completed by another provider, this initial triage assessment does not replace that evaluation, and the importance of remaining in the ED until their evaluation is complete.     Anselm Pancoast, PA-C 08/10/20 2026    [name], MD 08/10/20 [phone]

## 2020-08-10 NOTE — ED Triage Notes (Signed)
Patient with chest pain and bilateral leg pain that has been going on all week.  She denies any shortness of breath, nausea or vomiting.

## 2020-08-11 DIAGNOSIS — R0789 Other chest pain: Secondary | ICD-10-CM | POA: Diagnosis not present

## 2020-08-11 LAB — TROPONIN I (HIGH SENSITIVITY): Troponin I (High Sensitivity): 7 ng/L (ref <18)

## 2020-08-11 MED ORDER — LIDOCAINE 5 % EX PTCH
1.0000 | MEDICATED_PATCH | CUTANEOUS | Status: DC
  Administered 2020-08-11: 1 via TRANSDERMAL
  Filled 2020-08-11: qty 1

## 2020-08-11 NOTE — Social Work (Signed)
CSW received request for assistance with transportation.  Due to patient's age CSW opted to authorize use of a taxi voucher for this patient.

## 2020-08-11 NOTE — Discharge Instructions (Addendum)
Your evaluated for your chest pain and leg pain.  Our chest pain work-up was unremarkable, did not show any evidence of heart attack or pneumonia or collapsed lung.  We put a lidocaine patch on your back which should help with your leg pain.  Please follow-up with the number above for reevaluation and further management.  Please return to the ER for worsening symptoms.

## 2020-08-11 NOTE — ED Provider Notes (Signed)
[facility] EMERGENCY DEPARTMENT Provider Note   CSN: [id number] Arrival date & time: 08/10/20  1817     History Chief Complaint  Patient presents with   Chest Pain    [name] is a 75 y.o. female.   Chest Pain  75 year old female PMHx DM, GERD, HTN, CVA, ACS, depression, presenting for chest pain and leg pain.  The chest pain is midsternal, nonradiating, pressure-like quality, mild severity, no apparent improving or worsening factors, has been ongoing intermittently since her stroke years ago.  Initially reports bilateral lower extremity pain and swelling, with shooting pains from her thighs wrapping around to her feet.  She does not have any associated back pain.  This is also longstanding without any acute worsening.  She states that her family doctor recently changed locations, so she has been going to the ER for all medical concerns.  No further medical concern at this time including fevers, chills, diaphoresis, sore throat, rhinorrhea, shortness of breath, cough, palpitations, abdominal pain, N/V, bowel/bladder changes, focal paresthesias/weakness.  History obtained from patient and chart review.    Past Medical History:  Diagnosis Date   Diabetes mellitus without complication ([place])    GERD 09/04/2006   Qualifier: Diagnosis of  By: Suzie Portela     History of echocardiogram    a. 2D ECHO: 11/06/2013 EF 65%; no WMA. Mild TR. 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   Numbness 06/20/2012   Diabetes mellitus ([place]) 06/20/2012    CVA (cerebral infarction) 06/20/2012   Hypokalemia 06/20/2012   ARTHRALGIA 01/28/2009   CHEST PAIN UNSPECIFIED 11/26/2008   DEPRESSION 09/04/2006   Essential hypertension 09/04/2006   GERD 09/04/2006   MULTIPLE SITES, ARTHRITIS-ACUTE/CHRONIC 09/04/2006   LOW BACK PAIN, CHRONIC 09/04/2006    Past Surgical History:  Procedure Laterality Date   ABDOMINAL HYSTERECTOMY     ANTERIOR CERVICAL DECOMPRESSION/DISCECTOMY FUSION 4 LEVELS N/A 11/11/2019   Procedure: Cervical three-four Cervical four-five Cervical five-six Cervical six-seven  Anterior cervical decompression/discectomy/fusion;  Surgeon: Erline Levine, MD;  Location: [place];  Service: Neurosurgery;  Laterality: N/A;   LEFT HEART CATHETERIZATION WITH CORONARY ANGIOGRAM N/A 11/05/2013   Procedure: LEFT HEART CATHETERIZATION WITH CORONARY ANGIOGRAM;  Surgeon: Troy Sine, MD;  Location: [facility] CATH LAB;  Service: Cardiovascular;  Laterality: N/A;     OB History   No obstetric history on file.     History reviewed. No pertinent family history.  Social History   Tobacco Use   Smoking status: Never   Smokeless tobacco: Never  Vaping Use   Vaping Use: Never used  Substance Use Topics   Alcohol use: No   Drug use: No    Home Medications Prior to Admission medications   Medication Sig Start Date End Date Taking? Authorizing Provider  acetaminophen (TYLENOL) 325 MG tablet Take 2 tablets (650 mg total) by mouth every 6 (six) hours as needed for up to 30 doses for mild pain or moderate pain. 05/28/20   Wyvonnia Dusky, MD  albuterol (VENTOLIN HFA) 108 (90 Base) MCG/ACT inhaler Inhale  1-2 puffs into the lungs every 6 (six) hours as needed for wheezing or shortness of breath. 04/22/20   Vevelyn Francois, NP  aspirin EC 81 MG tablet Take 81 mg by mouth daily. Swallow whole.    [provider]  Blood Glucose Monitoring Suppl (ONETOUCH VERIO) w/Device KIT Use 1-4 times daily as needed/directed DX E11.9 Patient not taking: No sig  reported 04/25/20   Vevelyn Francois, NP  chlorthalidone (HYGROTON) 25 MG tablet Take 1 tablet (25 mg total) by mouth daily. 04/22/20   Vevelyn Francois, NP  cloNIDine (CATAPRES) 0.2 MG tablet Take 1 tablet (0.2 mg total) by mouth 2 (two) times daily. 12/10/19   Jaynee Eagles, PA-C  clopidogrel (PLAVIX) 75 MG tablet Take 1 tablet (75 mg total) by mouth daily. 04/22/20   Vevelyn Francois, NP  diclofenac Sodium (VOLTAREN) 1 % GEL Apply 2 g topically 4 (four) times daily as needed. 07/27/20   Quintella Reichert, MD  diphenhydramine-acetaminophen (TYLENOL PM) 25-500 MG TABS tablet Take 1 tablet by mouth at bedtime.    [provider]  gabapentin (NEURONTIN) 300 MG capsule Take 1 capsule (300 mg total) by mouth daily for 1 day, THEN 1 capsule (300 mg total) 2 (two) times daily for 1 day, THEN 1 capsule (300 mg total) 3 (three) times daily. 07/28/20 10/28/20  Arnaldo Natal, MD  glipiZIDE (GLUCOTROL) 5 MG tablet Take 1 tablet (5 mg total) by mouth 2 (two) times daily before a meal. 04/22/20 04/22/21  Vevelyn Francois, NP  hydrALAZINE (APRESOLINE) 50 MG tablet Take 1 tablet (50 mg total) by mouth 2 (two) times daily. 04/22/20   Vevelyn Francois, NP  Lancets [facility] ULTRASOFT) lancets Use 1-4 times daily as needed/directed  DX E11.9 Patient taking differently: 1 each by Other route See admin instructions. Use 1-4 times daily as needed/directed  DX E11.9 04/25/20   Vevelyn Francois, NP  lidocaine (LIDODERM) 5 % Place 1 patch onto the skin daily. Remove & Discard patch within 12 hours or as directed by MD Patient not taking: No sig reported 04/22/20 04/22/21  Vevelyn Francois, NP  lidocaine (LIDODERM) 5 % Place 1 patch onto the skin daily. Remove & Discard patch within 12 hours or as directed by MD 07/21/20   Randal Buba, April, MD  losartan (COZAAR) 50 MG tablet Take 50 mg by mouth daily. 05/07/20   [provider]  metFORMIN (GLUCOPHAGE) 500 MG tablet Take 1.5 tablets (750 mg total) by mouth 2 (two) times daily with a  meal. Patient taking differently: Take 1,000 mg by mouth 2 (two) times daily with a meal. 10/27/19 07/06/20  Veryl Speak, MD  methocarbamol (ROBAXIN) 500 MG tablet TAKE 1 TABLET BY MOUTH 4 TIMES DAILY. Patient taking differently: Take 500 mg by mouth in the morning and at bedtime. 05/09/20   Vevelyn Francois, NP  MYRBETRIQ 25 MG TB24 tablet Take 25 mg by mouth daily. 05/18/20   [provider]  [facility] VERIO test strip Use 1-4 times daily as directed/needed   DX E11.9 Patient taking differently: 1 each by Other route See admin instructions. Use 1-4 times daily as directed/needed   DX E11.9 04/25/20   Vevelyn Francois, NP  oxyCODONE (ROXICODONE) 5 MG immediate release tablet Take 1 tablet (5 mg total) by mouth every 6 (six) hours as needed for up to 4 doses for severe pain. 05/28/20   Wyvonnia Dusky, MD  penicillin v potassium (VEETID) 500 MG tablet Take 1  tablet (500 mg total) by mouth 4 (four) times daily for 7 days. 08/07/20 08/14/20  Volanda Napoleon, PA-C  potassium chloride (KLOR-CON) 10 MEQ tablet Take 1 tablet (10 mEq total) by mouth daily. 04/25/20   Noemi Chapel, MD  potassium chloride SA (KLOR-CON) 20 MEQ tablet Take 1 tablet (20 mEq total) by mouth 2 (two) times daily. 07/13/20   Fredia Sorrow, MD  rosuvastatin (CRESTOR) 10 MG tablet Take 1 tablet (10 mg total) by mouth at bedtime. 04/22/20 04/22/21  Vevelyn Francois, NP    Allergies    Codeine, Ibuprofen, and Imdur [isosorbide nitrate]  Review of Systems   Review of Systems  Cardiovascular:  Positive for chest pain.  All other systems reviewed and are negative.  Physical Exam Updated Vital Signs BP (!) 176/65   Pulse 66   Temp 98.1 F (36.7 C) (Oral)   Resp 16   SpO2 97%   Physical Exam Vitals and nursing note reviewed.  Constitutional:      General: She is not in acute distress.    Appearance: She is well-developed.  HENT:     Head: Normocephalic and atraumatic.  Eyes:     Conjunctiva/sclera: Conjunctivae normal.   Neck:     Trachea: No tracheal deviation.  Cardiovascular:     Rate and Rhythm: Normal rate and regular rhythm.     Heart sounds: Murmur heard.  Pulmonary:     Effort: Pulmonary effort is normal. No respiratory distress.     Breath sounds: Normal breath sounds.  Abdominal:     Palpations: Abdomen is soft.     Tenderness: There is no abdominal tenderness.  Musculoskeletal:     Cervical back: Neck supple.     Right lower leg: Tenderness present. Edema present.     Left lower leg: Tenderness present. Edema present.     Comments: Bilateral lower extremities neurovascularly intact distally, and tender to palpation when testing for pedal edema.  However no induration, erythema, fluctuance noted.  She does have a positive straight leg raise bilaterally, but no midline L-spine tenderness.  Skin:    General: Skin is warm and dry.  Neurological:     General: No focal deficit present.     Mental Status: She is alert and oriented to person, place, and time.  Psychiatric:        Mood and Affect: Mood normal.        Behavior: Behavior normal.    ED Results / Procedures / Treatments   Labs (all labs ordered are listed, but only abnormal results are displayed) Labs Reviewed  BASIC METABOLIC PANEL - Abnormal; Notable for the following components:      Result Value   Potassium 3.4 (*)    Glucose, Bld 115 (*)    Creatinine, Ser 1.24 (*)    GFR, Estimated 46 (*)    All other components within normal limits  CBC - Abnormal; Notable for the following components:   RBC 3.76 (*)    Hemoglobin 11.5 (*)    HCT 35.4 (*)    All other components within normal limits  TROPONIN I (HIGH SENSITIVITY)  TROPONIN I (HIGH SENSITIVITY)    EKG EKG Interpretation  Date/Time:  Wednesday August 10 2020 20:22:48 EDT Ventricular Rate:  75 PR Interval:  120 QRS Duration: 62 QT Interval:  388 QTC Calculation: 433 R Axis:   72 Text Interpretation: Normal sinus rhythm Normal ECG Confirmed by Davonna Belling [phone]) on 08/11/2020 10:17:52 AM  Radiology DG Chest  2 View  Result Date: 08/10/2020 CLINICAL DATA:  Chest pain. Patient with chest pain and bilateral leg pain that has been going on all week. She denies any shortness of breath, nausea or vomiting. Hx of stroke, hypertension, and diabetes EXAM: CHEST - 2 VIEW COMPARISON:  Chest x-ray 08/01/2020, CT chest 11/27/2019, chest x-ray 07/12/2020, chest x-ray 07/28/2020 FINDINGS: The heart size and mediastinal contours are within normal limits. No focal consolidation. No pulmonary edema. No pleural effusion. No pneumothorax. No acute osseous abnormality. IMPRESSION: No active cardiopulmonary disease. Electronically Signed   By: Iven Finn M.D.   On: 08/10/2020 21:04    Procedures Procedures   Medications Ordered in ED Medications  lidocaine (LIDODERM) 5 % 1 patch (1 patch Transdermal Patch Applied 08/11/20 2116)    ED Course  I have reviewed the triage vital signs and the nursing notes.  Pertinent labs & imaging results that were available during my care of the patient were reviewed by me and considered in my medical decision making (see chart for details).    MDM Rules/Calculators/A&P                          This is a 75 year old female with PMHx DM, GERD, HTN, CVA, ACS, depression, presenting for longstanding chest pain and leg pain.  Bilateral lower extremity pitting edema noted, otherwise examination is generally benign.  Patient is afebrile, with VSS.  Initial interventions: Lidocaine patch provided for pain control  All studies independently reviewed by myself, d/w the attending physician, factored into my MDM. -EKG: NSR 75 bpm, normal axis, normal intervals, no acute ST-T changes, essentially unchanged compared to prior from 08/10/2020 -Unremarkable: Delta troponin, CBC, BMP, CXR  Presentation appears most consistent with chronic chest pain and bilateral lower extremity neuropathic pain.  Less likely ACS, carditis,  arrhythmia; no significant change to symptoms, no acute EKG changes, negative troponin x2.  Unlikely PE; no tachycardia, hypoxia, cough, pleuritic CP.  Unlikely dissection; no tearing or posteriorly radiating CP, pulse differences, nor FND.  Unlikely tamponade; HDS, not displaying tamponade physiology.  Unlikely CHF exacerbation; no orthopnea, pedal edema is chronic in nature.  No cough, fever, or leukocytosis, lungs CTA bilaterally without localizing findings or wheeze, with good air movement bilaterally.  CXR without infiltrate, PTX, effusion, or pneumoperitoneum.  Unlikely GI etiology.  Not reproducible on palpation suggesting is MSK CP.  Does not appear acutely anxious, suggesting this panic attack.  Therefore, feel the patient stable for discharge home with close outpatient follow-up.  I provided her with contact information for our wellness clinic.  Strict return precautions provided.  She understands and agrees with this plan.  Patient HDS, nontoxic on reevaluation, subsequently discharged.  Final Clinical Impression(s) / ED Diagnoses Final diagnoses:  Atypical chest pain  Bilateral lower extremity pain    Rx / DC Orders ED Discharge Orders     None        Levin Bacon, MD 08/12/20 [id number]    Lucrezia Starch, MD 08/14/20 1022

## 2020-08-11 NOTE — ED Notes (Signed)
Discharge instructions discussed with pt. Pt verbalized understanding with no questions at this time.   Cab voucher approved by charge RN Inetta Fermo. [employer] called for transportation home. Pt ambulatory to lobby to wait for cab.

## 2020-08-15 ENCOUNTER — Emergency Department (HOSPITAL_COMMUNITY): Payer: Medicare Other

## 2020-08-15 ENCOUNTER — Emergency Department (HOSPITAL_COMMUNITY)
Admission: EM | Admit: 2020-08-15 | Discharge: 2020-08-16 | Disposition: A | Discharge status: Home / Self Care | Payer: Medicare Other | Attending: Emergency Medicine | Admitting: Emergency Medicine

## 2020-08-15 ENCOUNTER — Encounter (HOSPITAL_COMMUNITY): Payer: Self-pay

## 2020-08-15 DIAGNOSIS — Z79899 Other long term (current) drug therapy: Secondary | ICD-10-CM | POA: Diagnosis not present

## 2020-08-15 DIAGNOSIS — Z7984 Long term (current) use of oral hypoglycemic drugs: Secondary | ICD-10-CM | POA: Insufficient documentation

## 2020-08-15 DIAGNOSIS — G8929 Other chronic pain: Secondary | ICD-10-CM | POA: Insufficient documentation

## 2020-08-15 DIAGNOSIS — M79661 Pain in right lower leg: Secondary | ICD-10-CM | POA: Diagnosis not present

## 2020-08-15 DIAGNOSIS — M79605 Pain in left leg: Secondary | ICD-10-CM | POA: Insufficient documentation

## 2020-08-15 DIAGNOSIS — R519 Headache, unspecified: Secondary | ICD-10-CM | POA: Diagnosis not present

## 2020-08-15 DIAGNOSIS — I10 Essential (primary) hypertension: Secondary | ICD-10-CM | POA: Diagnosis not present

## 2020-08-15 DIAGNOSIS — M79604 Pain in right leg: Secondary | ICD-10-CM | POA: Insufficient documentation

## 2020-08-15 DIAGNOSIS — Z7982 Long term (current) use of aspirin: Secondary | ICD-10-CM | POA: Insufficient documentation

## 2020-08-15 DIAGNOSIS — E119 Type 2 diabetes mellitus without complications: Secondary | ICD-10-CM | POA: Diagnosis not present

## 2020-08-15 DIAGNOSIS — R0602 Shortness of breath: Secondary | ICD-10-CM | POA: Diagnosis not present

## 2020-08-15 DIAGNOSIS — Z7902 Long term (current) use of antithrombotics/antiplatelets: Secondary | ICD-10-CM | POA: Diagnosis not present

## 2020-08-15 DIAGNOSIS — M549 Dorsalgia, unspecified: Secondary | ICD-10-CM | POA: Insufficient documentation

## 2020-08-15 DIAGNOSIS — M25552 Pain in left hip: Secondary | ICD-10-CM | POA: Insufficient documentation

## 2020-08-15 DIAGNOSIS — Z8616 Personal history of COVID-19: Secondary | ICD-10-CM | POA: Insufficient documentation

## 2020-08-15 DIAGNOSIS — M79662 Pain in left lower leg: Secondary | ICD-10-CM | POA: Diagnosis not present

## 2020-08-15 NOTE — ED Triage Notes (Signed)
Pt states that she has been having a headache and bilateral leg pain for the past week, seen last week for the same, denies fevers.

## 2020-08-15 NOTE — ED Provider Notes (Signed)
Emergency Medicine Provider Triage Evaluation Note  [name] , a 75 y.o. female  was evaluated in triage.  Pt complains of chest pain, shortness of breath, leg pain.  Reports she was seen for the same 5 days ago without any relief.  Pain in her legs is worse when walking but does not get better when resting.  Reports the pain in her chest is mild.  Minimal shortness of breath.  Has used lidocaine patches on her legs without relief.  No other concerns.  Is not vaccinated for COVID.Marland Kitchen  Review of Systems  Positive: Chest pain, shortness of breath, leg pain Negative: Vomiting, diarrhea, fevers  Physical Exam  BP (!) 144/67 (BP Location: Left Arm)   Pulse 72   Temp 97.8 F (36.6 C) (Oral)   Resp 18   SpO2 97%  Gen:   Awake, no distress   Resp:  Normal effort, clear breath sounds MSK:   Moves extremities without difficulty, no significant edema Other:  Tenderness to palpation of bilateral lower extremities  Medical Decision Making  Medically screening exam initiated at 11:21 PM.  Appropriate orders placed.  [name] was informed that the remainder of the evaluation will be completed by another provider, this initial triage assessment does not replace that evaluation, and the importance of remaining in the ED until their evaluation is complete.  Chest pain, shortness of breath and leg pain   [name], [name] 08/15/20 2323    Horton, Clabe Seal, DO 08/15/20 2336

## 2020-08-16 DIAGNOSIS — M79604 Pain in right leg: Secondary | ICD-10-CM | POA: Diagnosis not present

## 2020-08-16 LAB — CBC WITH DIFFERENTIAL/PLATELET
Abs Immature Granulocytes: 0.02 10*3/uL (ref 0.00–0.07)
Basophils Absolute: 0 10*3/uL (ref 0.0–0.1)
Basophils Relative: 1 %
Eosinophils Absolute: 0.1 10*3/uL (ref 0.0–0.5)
Eosinophils Relative: 1 %
HCT: 34.7 % — ABNORMAL LOW (ref 36.0–46.0)
Hemoglobin: 11.2 g/dL — ABNORMAL LOW (ref 12.0–15.0)
Immature Granulocytes: 0 %
Lymphocytes Relative: 20 %
Lymphs Abs: 1.5 10*3/uL (ref 0.7–4.0)
MCH: 30.7 pg (ref 26.0–34.0)
MCHC: 32.3 g/dL (ref 30.0–36.0)
MCV: 95.1 fL (ref 80.0–100.0)
Monocytes Absolute: 0.5 10*3/uL (ref 0.1–1.0)
Monocytes Relative: 6 %
Neutro Abs: 5.5 10*3/uL (ref 1.7–7.7)
Neutrophils Relative %: 72 %
Platelets: 294 10*3/uL (ref 150–400)
RBC: 3.65 MIL/uL — ABNORMAL LOW (ref 3.87–5.11)
RDW: 12.6 % (ref 11.5–15.5)
WBC: 7.6 10*3/uL (ref 4.0–10.5)
nRBC: 0 % (ref 0.0–0.2)

## 2020-08-16 LAB — COMPREHENSIVE METABOLIC PANEL
ALT: 16 U/L (ref 0–44)
AST: 21 U/L (ref 15–41)
Albumin: 3.8 g/dL (ref 3.5–5.0)
Alkaline Phosphatase: 85 U/L (ref 38–126)
Anion gap: 11 (ref 5–15)
BUN: 29 mg/dL — ABNORMAL HIGH (ref 8–23)
CO2: 26 mmol/L (ref 22–32)
Calcium: 9.3 mg/dL (ref 8.9–10.3)
Chloride: 102 mmol/L (ref 98–111)
Creatinine, Ser: 1.31 mg/dL — ABNORMAL HIGH (ref 0.44–1.00)
GFR, Estimated: 43 mL/min — ABNORMAL LOW (ref >60)
Glucose, Bld: 114 mg/dL — ABNORMAL HIGH (ref 70–99)
Potassium: 3.3 mmol/L — ABNORMAL LOW (ref 3.5–5.1)
Sodium: 139 mmol/L (ref 135–145)
Total Bilirubin: 0.3 mg/dL (ref 0.3–1.2)
Total Protein: 7.9 g/dL (ref 6.5–8.1)

## 2020-08-16 LAB — TROPONIN I (HIGH SENSITIVITY)
Troponin I (High Sensitivity): 8 ng/L (ref <18)
Troponin I (High Sensitivity): 7 ng/L (ref <18)

## 2020-08-16 LAB — BRAIN NATRIURETIC PEPTIDE: B Natriuretic Peptide: 16.5 pg/mL (ref 0.0–100.0)

## 2020-08-16 MED ORDER — LIDOCAINE 5 % EX PTCH
1.0000 | MEDICATED_PATCH | CUTANEOUS | Status: DC
  Administered 2020-08-16: 1 via TRANSDERMAL
  Filled 2020-08-16: qty 1

## 2020-08-16 MED ORDER — ACETAMINOPHEN 325 MG PO TABS
650.0000 mg | ORAL_TABLET | Freq: Once | ORAL | Status: AC
  Administered 2020-08-16: 650 mg via ORAL
  Filled 2020-08-16: qty 2

## 2020-08-16 MED ORDER — PROCHLORPERAZINE MALEATE 5 MG PO TABS
10.0000 mg | ORAL_TABLET | Freq: Once | ORAL | Status: AC
  Administered 2020-08-16: 10 mg via ORAL
  Filled 2020-08-16: qty 2

## 2020-08-16 NOTE — ED Notes (Signed)
Pt alert, NAD, calm, interactive, resps e/u, speaking in clear complete sentences. C/o L leg pain and HA. meds given.

## 2020-08-16 NOTE — ED Provider Notes (Addendum)
[facility] EMERGENCY DEPARTMENT Provider Note   CSN: [id number] Arrival date & time: 08/15/20  2154     History Chief Complaint  Patient presents with   Headache    [name] is a 75 y.o. female with history of hypertension, diabetes, chronic back and leg pain, presented to emergency department with complaint of lower leg pain.  The patient has been seen in the ED 22 times in the past 6 months, including a dozen times in the past 2 months.  She returns today for recurring bilateral lower leg and hip pain.  She also reported she had a headache last night, but says "does not bother me this morning".  She denies any chest pain.  She reports that she keeps coming to the ED because "my doctor's office close down".  HPI     Past Medical History:  Diagnosis Date   Diabetes mellitus without complication ([place])    GERD 09/04/2006   Qualifier: Diagnosis of  By: Suzie Portela     History of echocardiogram    a. 2D ECHO: 11/06/2013 EF 65%; no WMA. Mild TR. 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   Numbness 06/20/2012   Diabetes mellitus ([place]) 06/20/2012   CVA (cerebral infarction) 06/20/2012   Hypokalemia 06/20/2012   ARTHRALGIA 01/28/2009   CHEST PAIN UNSPECIFIED 11/26/2008   DEPRESSION 09/04/2006   Essential hypertension 09/04/2006   GERD 09/04/2006   MULTIPLE SITES, ARTHRITIS-ACUTE/CHRONIC 09/04/2006   LOW BACK PAIN, CHRONIC 09/04/2006    Past Surgical History:  Procedure Laterality Date   ABDOMINAL HYSTERECTOMY     ANTERIOR  CERVICAL DECOMPRESSION/DISCECTOMY FUSION 4 LEVELS N/A 11/11/2019   Procedure: Cervical three-four Cervical four-five Cervical five-six Cervical six-seven  Anterior cervical decompression/discectomy/fusion;  Surgeon: Erline Levine, MD;  Location: [place];  Service: Neurosurgery;  Laterality: N/A;   LEFT HEART CATHETERIZATION WITH CORONARY ANGIOGRAM N/A 11/05/2013   Procedure: LEFT HEART CATHETERIZATION WITH CORONARY ANGIOGRAM;  Surgeon: Troy Sine, MD;  Location: [facility] CATH LAB;  Service: Cardiovascular;  Laterality: N/A;     OB History   No obstetric history on file.     No family history on file.  Social History   Tobacco Use   Smoking status: Never   Smokeless tobacco: Never  Vaping Use   Vaping Use: Never used  Substance Use Topics   Alcohol use: No   Drug use: No    Home Medications Prior to Admission medications   Medication Sig Start Date End Date Taking? Authorizing Provider  acetaminophen (TYLENOL) 325 MG tablet Take 2 tablets (650 mg total) by mouth every 6 (six) hours as needed for up to 30 doses for mild pain or moderate pain. 05/28/20   Wyvonnia Dusky, MD  albuterol (VENTOLIN HFA) 108 (90 Base) MCG/ACT inhaler Inhale 1-2 puffs into the lungs every 6 (six) hours as needed for wheezing or shortness of breath. 04/22/20   Vevelyn Francois, NP  aspirin EC 81 MG tablet Take 81 mg by mouth daily. Swallow whole.    [provider]  Blood Glucose Monitoring Suppl (ONETOUCH VERIO) w/Device KIT Use 1-4 times daily as needed/directed DX E11.9 Patient not taking: No sig reported 04/25/20   Vevelyn Francois, NP  chlorthalidone (HYGROTON) 25 MG tablet Take 1 tablet (25 mg total) by mouth daily. 04/22/20   Vevelyn Francois, NP  cloNIDine (CATAPRES) 0.2 MG tablet Take 1 tablet (0.2 mg total) by mouth 2 (two) times daily. 12/10/19   Jaynee Eagles, PA-C  clopidogrel (PLAVIX) 75 MG tablet Take 1 tablet (75 mg total) by mouth daily. 04/22/20   Vevelyn Francois, NP  diclofenac Sodium (VOLTAREN)  1 % GEL Apply 2 g topically 4 (four) times daily as needed. 07/27/20   Quintella Reichert, MD  diphenhydramine-acetaminophen (TYLENOL PM) 25-500 MG TABS tablet Take 1 tablet by mouth at bedtime.    [provider]  gabapentin (NEURONTIN) 300 MG capsule Take 1 capsule (300 mg total) by mouth daily for 1 day, THEN 1 capsule (300 mg total) 2 (two) times daily for 1 day, THEN 1 capsule (300 mg total) 3 (three) times daily. 07/28/20 10/28/20  Arnaldo Natal, MD  glipiZIDE (GLUCOTROL) 5 MG tablet Take 1 tablet (5 mg total) by mouth 2 (two) times daily before a meal. 04/22/20 04/22/21  Vevelyn Francois, NP  hydrALAZINE (APRESOLINE) 50 MG tablet Take 1 tablet (50 mg total) by mouth 2 (two) times daily. 04/22/20   Vevelyn Francois, NP  Lancets [facility] ULTRASOFT) lancets Use 1-4 times daily as needed/directed  DX E11.9 Patient taking differently: 1 each by Other route See admin instructions. Use 1-4 times daily as needed/directed  DX E11.9 04/25/20   Vevelyn Francois, NP  lidocaine (LIDODERM) 5 % Place 1 patch onto the skin daily. Remove & Discard patch within 12 hours or as directed by MD Patient not taking: No sig reported 04/22/20 04/22/21  Vevelyn Francois, NP  lidocaine (LIDODERM) 5 % Place 1 patch onto the skin daily. Remove & Discard patch within 12 hours or as directed by MD 07/21/20   Randal Buba, April, MD  losartan (COZAAR) 50 MG tablet Take 50 mg by mouth daily. 05/07/20   [provider]  metFORMIN (GLUCOPHAGE) 500 MG tablet Take 1.5 tablets (750 mg total) by mouth 2 (two) times daily with a meal. Patient taking differently: Take 1,000 mg by mouth 2 (two) times daily with a meal. 10/27/19 07/06/20  Veryl Speak, MD  methocarbamol (ROBAXIN) 500 MG tablet TAKE 1 TABLET BY MOUTH 4 TIMES DAILY. Patient taking differently: Take 500 mg by mouth in the morning and at bedtime. 05/09/20   Vevelyn Francois, NP  MYRBETRIQ 25 MG TB24 tablet Take 25 mg by mouth daily. 05/18/20   [provider]  [facility] VERIO test  strip Use 1-4 times daily as directed/needed   DX E11.9 Patient taking differently: 1 each by Other route See admin instructions. Use 1-4 times daily as directed/needed   DX E11.9 04/25/20   Vevelyn Francois, NP  oxyCODONE (ROXICODONE) 5 MG immediate release tablet Take 1 tablet (5 mg total) by mouth every 6 (six) hours as needed for up to 4 doses for severe pain. 05/28/20   Wyvonnia Dusky, MD  potassium chloride (KLOR-CON) 10 MEQ tablet Take 1 tablet (10 mEq total) by mouth daily. 04/25/20   Noemi Chapel, MD  potassium chloride SA (KLOR-CON) 20 MEQ tablet Take 1 tablet (20 mEq total) by mouth 2 (two) times daily. 07/13/20   Fredia Sorrow, MD  rosuvastatin (CRESTOR) 10 MG tablet Take  1 tablet (10 mg total) by mouth at bedtime. 04/22/20 04/22/21  Vevelyn Francois, NP    Allergies    Codeine, Ibuprofen, and Imdur [isosorbide nitrate]  Review of Systems   Review of Systems  Constitutional:  Negative for chills and fever.  Respiratory:  Negative for cough and shortness of breath.   Cardiovascular:  Negative for chest pain and palpitations.  Gastrointestinal:  Negative for abdominal pain and vomiting.  Musculoskeletal:  Positive for arthralgias, back pain and myalgias.  Skin:  Negative for color change and rash.  Neurological:  Positive for headaches. Negative for syncope and light-headedness.  All other systems reviewed and are negative.  Physical Exam Updated Vital Signs BP (!) 166/66   Pulse 70   Temp 97.8 F (36.6 C) (Oral)   Resp 18   SpO2 97%   Physical Exam Constitutional:      General: She is not in acute distress. HENT:     Head: Normocephalic and atraumatic.  Eyes:     Conjunctiva/sclera: Conjunctivae normal.     Pupils: Pupils are equal, round, and reactive to light.  Cardiovascular:     Rate and Rhythm: Normal rate and regular rhythm.  Pulmonary:     Effort: Pulmonary effort is normal. No respiratory distress.  Abdominal:     General: There is no distension.      Tenderness: There is no abdominal tenderness.  Skin:    General: Skin is warm and dry.  Neurological:     General: No focal deficit present.     Mental Status: She is alert. Mental status is at baseline.  Psychiatric:        Mood and Affect: Mood normal.        Behavior: Behavior normal.    ED Results / Procedures / Treatments   Labs (all labs ordered are listed, but only abnormal results are displayed) Labs Reviewed  CBC WITH DIFFERENTIAL/PLATELET - Abnormal; Notable for the following components:      Result Value   RBC 3.65 (*)    Hemoglobin 11.2 (*)    HCT 34.7 (*)    All other components within normal limits  COMPREHENSIVE METABOLIC PANEL - Abnormal; Notable for the following components:   Potassium 3.3 (*)    Glucose, Bld 114 (*)    BUN 29 (*)    Creatinine, Ser 1.31 (*)    GFR, Estimated 43 (*)    All other components within normal limits  BRAIN NATRIURETIC PEPTIDE  TROPONIN I (HIGH SENSITIVITY)  TROPONIN I (HIGH SENSITIVITY)    EKG EKG Interpretation  Date/Time:  Monday August 15 2020 23:27:08 EDT Ventricular Rate:  69 PR Interval:  102 QRS Duration: 66 QT Interval:  394 QTC Calculation: 422 R Axis:   54 Text Interpretation: Sinus rhythm with short PR Otherwise normal ECG Confirmed by Octaviano Glow [phone]) on 08/16/2020 7:05:35 AM  Radiology DG Chest 2 View  Result Date: 08/16/2020 CLINICAL DATA:  Headache and bilateral leg pain x1 week. EXAM: CHEST - 2 VIEW COMPARISON:  August 10, 2020 FINDINGS: The heart size and mediastinal contours are within normal limits. Both lungs are clear. A radiopaque fusion plate and screws are seen overlying the lower cervical spine. The visualized skeletal structures are otherwise unremarkable. IMPRESSION: No active cardiopulmonary disease. Electronically Signed   By: Virgina Norfolk M.D.   On: 08/16/2020 00:31    Procedures Procedures   Medications Ordered in ED Medications  lidocaine (LIDODERM) 5 % 1 patch (1 patch  Transdermal Patch  Applied 08/16/20 0813)  prochlorperazine (COMPAZINE) tablet 10 mg (10 mg Oral Given 08/16/20 0813)  acetaminophen (TYLENOL) tablet 650 mg (650 mg Oral Given 08/16/20 2202)    ED Course  I have reviewed the triage vital signs and the nursing notes.  Pertinent labs & imaging results that were available during my care of the patient were reviewed by me and considered in my medical decision making (see chart for details).  75 yo female here with chronic back and leg pain. Labs reviewed and close to baseline levels.  BNP and trop unremarkable.  Doubt worsening CHF.  I suspect this is may be sciatica, she's requested a "pain patch" on her left hip which has helped before.  This appears to be lidocaine patch from prior EMR review.  We'll reorder her.  PO compazine for headache which is very mild.   No chest pain at this time.  Reviewed her prior medical records including her recent ED visits and her social work notes.  There have been numerous attempt to set up home health, patient is not interested in this.  She does have a PCP appointment scheduled for September.  Clinical Course as of 08/16/20 1030  Tue Aug 16, 2020  0812 SW consulted on 08/07/20 and has scheduled patient PCP visit in September, which appears to be the earliest available time.  They have noted they will be reminding patient closer to that time.  Patient has been seen by SW multiple times prior and declined home health services. [MT]    Clinical Course User Index [MT] Tinley Rought, Carola Rhine, MD    Final Clinical Impression(s) / ED Diagnoses Final diagnoses:  Nonintractable headache, unspecified chronicity pattern, unspecified headache type  Pain in both lower extremities    Rx / DC Orders ED Discharge Orders     None        Wyvonnia Dusky, MD 08/16/20 1030    Wyvonnia Dusky, MD 08/16/20 1030

## 2020-08-16 NOTE — ED Notes (Signed)
Patient refused covid swab. States "I dont have that mess I dont need that test honey im fine."

## 2020-08-24 ENCOUNTER — Emergency Department (HOSPITAL_COMMUNITY): Payer: Medicare Other

## 2020-08-24 ENCOUNTER — Encounter (HOSPITAL_COMMUNITY): Payer: Self-pay | Admitting: Emergency Medicine

## 2020-08-24 ENCOUNTER — Emergency Department (HOSPITAL_COMMUNITY)
Admission: EM | Admit: 2020-08-24 | Discharge: 2020-08-25 | Disposition: A | Discharge status: Home / Self Care | Payer: Medicare Other | Attending: Emergency Medicine | Admitting: Emergency Medicine

## 2020-08-24 ENCOUNTER — Other Ambulatory Visit: Payer: Self-pay

## 2020-08-24 DIAGNOSIS — M79604 Pain in right leg: Secondary | ICD-10-CM | POA: Diagnosis not present

## 2020-08-24 DIAGNOSIS — R519 Headache, unspecified: Secondary | ICD-10-CM | POA: Diagnosis not present

## 2020-08-24 DIAGNOSIS — K0889 Other specified disorders of teeth and supporting structures: Secondary | ICD-10-CM | POA: Insufficient documentation

## 2020-08-24 DIAGNOSIS — Z8616 Personal history of COVID-19: Secondary | ICD-10-CM | POA: Insufficient documentation

## 2020-08-24 DIAGNOSIS — R0789 Other chest pain: Secondary | ICD-10-CM | POA: Diagnosis not present

## 2020-08-24 DIAGNOSIS — R079 Chest pain, unspecified: Secondary | ICD-10-CM | POA: Insufficient documentation

## 2020-08-24 DIAGNOSIS — Z955 Presence of coronary angioplasty implant and graft: Secondary | ICD-10-CM | POA: Insufficient documentation

## 2020-08-24 DIAGNOSIS — M79605 Pain in left leg: Secondary | ICD-10-CM | POA: Diagnosis not present

## 2020-08-24 DIAGNOSIS — Z7984 Long term (current) use of oral hypoglycemic drugs: Secondary | ICD-10-CM | POA: Diagnosis not present

## 2020-08-24 DIAGNOSIS — Z7982 Long term (current) use of aspirin: Secondary | ICD-10-CM | POA: Diagnosis not present

## 2020-08-24 DIAGNOSIS — E119 Type 2 diabetes mellitus without complications: Secondary | ICD-10-CM | POA: Insufficient documentation

## 2020-08-24 DIAGNOSIS — I10 Essential (primary) hypertension: Secondary | ICD-10-CM | POA: Insufficient documentation

## 2020-08-24 DIAGNOSIS — Z79899 Other long term (current) drug therapy: Secondary | ICD-10-CM | POA: Insufficient documentation

## 2020-08-24 DIAGNOSIS — M79642 Pain in left hand: Secondary | ICD-10-CM | POA: Diagnosis not present

## 2020-08-24 LAB — CBC WITH DIFFERENTIAL/PLATELET
Abs Immature Granulocytes: 0.03 10*3/uL (ref 0.00–0.07)
Basophils Absolute: 0.1 10*3/uL (ref 0.0–0.1)
Basophils Relative: 1 %
Eosinophils Absolute: 0.1 10*3/uL (ref 0.0–0.5)
Eosinophils Relative: 1 %
HCT: 32.7 % — ABNORMAL LOW (ref 36.0–46.0)
Hemoglobin: 10.7 g/dL — ABNORMAL LOW (ref 12.0–15.0)
Immature Granulocytes: 0 %
Lymphocytes Relative: 18 %
Lymphs Abs: 1.4 10*3/uL (ref 0.7–4.0)
MCH: 30.7 pg (ref 26.0–34.0)
MCHC: 32.7 g/dL (ref 30.0–36.0)
MCV: 93.7 fL (ref 80.0–100.0)
Monocytes Absolute: 0.5 10*3/uL (ref 0.1–1.0)
Monocytes Relative: 7 %
Neutro Abs: 5.7 10*3/uL (ref 1.7–7.7)
Neutrophils Relative %: 73 %
Platelets: 274 10*3/uL (ref 150–400)
RBC: 3.49 MIL/uL — ABNORMAL LOW (ref 3.87–5.11)
RDW: 12.5 % (ref 11.5–15.5)
WBC: 7.8 10*3/uL (ref 4.0–10.5)
nRBC: 0 % (ref 0.0–0.2)

## 2020-08-24 LAB — BASIC METABOLIC PANEL
Anion gap: 11 (ref 5–15)
BUN: 24 mg/dL — ABNORMAL HIGH (ref 8–23)
CO2: 26 mmol/L (ref 22–32)
Calcium: 8.9 mg/dL (ref 8.9–10.3)
Chloride: 102 mmol/L (ref 98–111)
Creatinine, Ser: 1.35 mg/dL — ABNORMAL HIGH (ref 0.44–1.00)
GFR, Estimated: 41 mL/min — ABNORMAL LOW (ref >60)
Glucose, Bld: 132 mg/dL — ABNORMAL HIGH (ref 70–99)
Potassium: 3.5 mmol/L (ref 3.5–5.1)
Sodium: 139 mmol/L (ref 135–145)

## 2020-08-24 LAB — TROPONIN I (HIGH SENSITIVITY): Troponin I (High Sensitivity): 11 ng/L (ref <18)

## 2020-08-24 NOTE — ED Provider Notes (Signed)
Emergency Medicine Provider Triage Evaluation Note  [name] , a 75 y.o. female  was evaluated in triage.  Pt complains of cp, ha, leg pain, arm pain.  Unclear when symptoms began or what is new. No known fall  Review of Systems  Positive: Cp, ha, r hand pain Negative: fever  Physical Exam  BP (!) 171/61   Pulse 70   Temp 98.1 F (36.7 C) (Oral)   Resp 18   Ht 5\' 5"  (1.651 m)   Wt 68 kg   SpO2 98%   BMI 24.95 kg/m  Gen:   Awake, no distress   Resp:  Normal effort  MSK:   Moves extremities without difficulty    Medical Decision Making  Medically screening exam initiated at 8:15 PM.  Appropriate orders placed.  Sherese Heyward was informed that the remainder of the evaluation will be completed by another provider, this initial triage assessment does not replace that evaluation, and the importance of remaining in the ED until their evaluation is complete.  Labs, hand xray   [name], PA-C 08/24/20 2016    10/24/20, MD 08/24/20 [phone]

## 2020-08-24 NOTE — ED Triage Notes (Signed)
Pt hre for cp and h/a that started last night and didn't go away. Pt says it is her whole chest that hurts, denies sob and nausea.

## 2020-08-25 ENCOUNTER — Emergency Department (HOSPITAL_COMMUNITY): Payer: Medicare Other

## 2020-08-25 DIAGNOSIS — R0789 Other chest pain: Secondary | ICD-10-CM | POA: Diagnosis not present

## 2020-08-25 DIAGNOSIS — R519 Headache, unspecified: Secondary | ICD-10-CM | POA: Diagnosis not present

## 2020-08-25 LAB — TROPONIN I (HIGH SENSITIVITY): Troponin I (High Sensitivity): 10 ng/L (ref <18)

## 2020-08-25 MED ORDER — PENICILLIN V POTASSIUM 500 MG PO TABS: 500.0000 mg | ORAL_TABLET | Freq: Four times a day (QID) | ORAL | 0 refills | Status: AC

## 2020-08-25 MED ORDER — PROCHLORPERAZINE MALEATE 5 MG PO TABS
10.0000 mg | ORAL_TABLET | Freq: Once | ORAL | Status: AC
  Administered 2020-08-25: 10 mg via ORAL
  Filled 2020-08-25: qty 2

## 2020-08-25 MED ORDER — ACETAMINOPHEN 500 MG PO TABS
1000.0000 mg | ORAL_TABLET | Freq: Four times a day (QID) | ORAL | Status: DC | PRN
  Administered 2020-08-25: 1000 mg via ORAL
  Filled 2020-08-25: qty 2

## 2020-08-25 NOTE — Discharge Planning (Signed)
Alper Guilmette J. Lucretia Roers, RN, BSN, [place] [phone]  RNCM set up appointment with Barbette Merino, NP on 10/05/20.  Transportation has been arranged. RNCM spoke with embedded SW Darl Pikes), who has pt on cancellation list and will call pt (and arrange transportation) if there is a cancellation for a sooner appointment.  Darl Pikes has attempted to move pt up before but she declines appointment due to "just being seen" in ED.

## 2020-08-25 NOTE — ED Provider Notes (Signed)
[facility] EMERGENCY DEPARTMENT Provider Note   CSN: [id number] Arrival date & time: 08/24/20  1951     History Chief Complaint  Patient presents with   Chest Pain   Headache    [name] is a 75 y.o. female.  HPI     75 year old female with history of diabetes, hypertension, CVA, coronary artery disease who presents with multiple concerns, including continuing dental pain, headache, chest pain, bilateral leg pain.  Has had multiple recent emergency department visits for these concerns-- including visit on July 25, July 20, July 17, July 11, July 7, July 5, June 30, June 29. Had CXR 7/25 which was normal.    Had CTA 6/28 which showed evidence of fibromuscular dysplasia.   Reports she is to have a primary care physician, but they left, and she has not reestablished with anyone which is why she has continued to return to the emergency department.  She is requesting a new prescription for penicillin for her dental and tooth pain, and is requesting diabetic shoes.  When I asked her why she is here today, she reports leg pain, which has been a chronic burning in her bilateral feet.  She also mentions that she has had chest pain which has also been ongoing over time.  Denies any associated shortness of breath, nausea or vomiting.  She reports she has a headache that is 10 out of 10, which has been present in the past but worsened last night.  Reports it feels like there is a man standing inside of her head hammering.  Past Medical History:  Diagnosis Date   Diabetes mellitus without complication ([place])    GERD 09/04/2006   Qualifier: Diagnosis of  By: Suzie Portela     History of echocardiogram    a. 2D ECHO: 11/06/2013 EF 65%; no WMA. Mild TR. 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   Numbness 06/20/2012   Diabetes mellitus ([place]) 06/20/2012   CVA (cerebral infarction) 06/20/2012   Hypokalemia 06/20/2012   ARTHRALGIA 01/28/2009   CHEST PAIN UNSPECIFIED 11/26/2008   DEPRESSION 09/04/2006   Essential hypertension 09/04/2006   GERD 09/04/2006   MULTIPLE SITES, ARTHRITIS-ACUTE/CHRONIC 09/04/2006   LOW BACK PAIN, CHRONIC 09/04/2006    Past Surgical History:  Procedure Laterality Date   ABDOMINAL HYSTERECTOMY     ANTERIOR CERVICAL DECOMPRESSION/DISCECTOMY FUSION 4 LEVELS N/A 11/11/2019   Procedure: Cervical three-four Cervical four-five Cervical five-six Cervical six-seven  Anterior cervical decompression/discectomy/fusion;  Surgeon: Erline Levine, MD;  Location: [place];  Service: Neurosurgery;  Laterality: N/A;   LEFT HEART CATHETERIZATION WITH CORONARY ANGIOGRAM N/A 11/05/2013   Procedure: LEFT HEART CATHETERIZATION WITH CORONARY ANGIOGRAM;  Surgeon: Troy Sine, MD;  Location: [facility] CATH LAB;  Service: Cardiovascular;  Laterality: N/A;     OB History   No obstetric history on file.     History reviewed. No pertinent family history.  Social History   Tobacco Use   Smoking status: Never   Smokeless tobacco: Never  Vaping Use   Vaping Use: Never used  Substance Use Topics   Alcohol use: No   Drug use: No    Home Medications Prior to Admission medications   Medication Sig Start Date End Date Taking? Authorizing Provider  diphenhydramine-acetaminophen (TYLENOL PM) 25-500 MG TABS tablet Take 1 tablet by mouth at bedtime.   Yes [provider]  penicillin v potassium (VEETID) 500 MG tablet Take 1 tablet (500 mg total) by mouth 4 (four) times daily for 7 days. 08/25/20 09/01/20 Yes Gareth Morgan, MD   acetaminophen (TYLENOL) 325 MG tablet Take 2 tablets (650 mg total) by mouth every 6 (six) hours as needed for up to 30 doses for mild pain or moderate pain. Patient not taking: Reported on 08/25/2020 05/28/20   Wyvonnia Dusky, MD  albuterol (VENTOLIN HFA) 108 (90 Base) MCG/ACT inhaler Inhale 1-2 puffs into the lungs every 6 (six) hours as needed for wheezing or shortness of breath. 04/22/20   Vevelyn Francois, NP  aspirin EC 81 MG tablet Take 81 mg by mouth daily. Swallow whole.    [provider]  Blood Glucose Monitoring Suppl (ONETOUCH VERIO) w/Device KIT Use 1-4 times daily as needed/directed DX E11.9 Patient not taking: No sig reported 04/25/20   Vevelyn Francois, NP  chlorthalidone (HYGROTON) 25 MG tablet Take 1 tablet (25 mg total) by mouth daily. 04/22/20   Vevelyn Francois, NP  cloNIDine (CATAPRES) 0.2 MG tablet Take 1 tablet (0.2 mg total) by mouth 2 (two) times daily. 12/10/19   Jaynee Eagles, PA-C  clopidogrel (PLAVIX) 75 MG tablet Take 1 tablet (75 mg total) by mouth daily. 04/22/20   Vevelyn Francois, NP  diclofenac Sodium (VOLTAREN) 1 % GEL Apply 2 g topically 4 (four) times daily as needed. 07/27/20   Quintella Reichert, MD  gabapentin (NEURONTIN) 300 MG capsule Take 1 capsule (300 mg total) by mouth daily for 1 day, THEN 1 capsule (300 mg total) 2 (two) times daily for 1 day, THEN 1 capsule (300 mg total) 3 (three) times daily. 07/28/20 10/28/20  Arnaldo Natal, MD  glipiZIDE (GLUCOTROL) 5 MG tablet Take 1 tablet (5 mg total) by mouth 2 (two) times daily before a meal. 04/22/20 04/22/21  Vevelyn Francois, NP  hydrALAZINE (APRESOLINE) 50 MG tablet Take 1 tablet (50 mg total) by mouth 2 (two) times daily. 04/22/20   Vevelyn Francois, NP  Lancets [facility] ULTRASOFT) lancets Use 1-4 times daily as needed/directed  DX E11.9 Patient taking differently: 1 each by Other route See admin instructions. Use 1-4 times daily as needed/directed  DX E11.9 04/25/20   Vevelyn Francois, NP  lidocaine (LIDODERM) 5 % Place 1  patch onto the skin daily. Remove & Discard patch within 12 hours or as directed by MD Patient not taking: No sig reported 04/22/20 04/22/21  Vevelyn Francois, NP  lidocaine (LIDODERM) 5 % Place 1 patch onto the skin daily. Remove & Discard patch within 12 hours or as directed by MD 07/21/20   Randal Buba, April, MD  losartan (COZAAR) 50 MG tablet Take 50 mg by mouth daily. 05/07/20   [provider]  metFORMIN (GLUCOPHAGE) 500 MG tablet Take 1.5 tablets (750 mg total) by mouth 2 (two) times daily with a meal. Patient taking differently: Take 1,000 mg by mouth 2 (two) times daily with a meal. 10/27/19 07/06/20  Veryl Speak, MD  methocarbamol (ROBAXIN) 500 MG tablet TAKE 1 TABLET BY MOUTH 4 TIMES DAILY. Patient taking differently: Take 500 mg by mouth in the morning and at  bedtime. 05/09/20   Barbette Merino, NP  MYRBETRIQ 25 MG TB24 tablet Take 25 mg by mouth daily. 05/18/20   [provider]  [facility] VERIO test strip Use 1-4 times daily as directed/needed   DX E11.9 Patient taking differently: 1 each by Other route See admin instructions. Use 1-4 times daily as directed/needed   DX E11.9 04/25/20   Barbette Merino, NP  oxyCODONE (ROXICODONE) 5 MG immediate release tablet Take 1 tablet (5 mg total) by mouth every 6 (six) hours as needed for up to 4 doses for severe pain. 05/28/20   Terald Sleeper, MD  potassium chloride (KLOR-CON) 10 MEQ tablet Take 1 tablet (10 mEq total) by mouth daily. 04/25/20   Eber Hong, MD  potassium chloride SA (KLOR-CON) 20 MEQ tablet Take 1 tablet (20 mEq total) by mouth 2 (two) times daily. 07/13/20   Vanetta Mulders, MD  rosuvastatin (CRESTOR) 10 MG tablet Take 1 tablet (10 mg total) by mouth at bedtime. 04/22/20 04/22/21  Barbette Merino, NP    Allergies    Codeine, Ibuprofen, and Imdur [isosorbide nitrate]  Review of Systems   Review of Systems  Constitutional:  Negative for fever.  HENT:  Positive for dental problem. Negative for sore throat.   Eyes:   Negative for visual disturbance.  Respiratory:  Negative for cough and shortness of breath.   Cardiovascular:  Positive for chest pain.  Gastrointestinal:  Negative for abdominal pain, nausea and vomiting.  Genitourinary:  Negative for difficulty urinating.  Musculoskeletal:  Positive for arthralgias. Negative for back pain and neck pain.  Skin:  Negative for rash.  Neurological:  Negative for syncope and headaches.   Physical Exam Updated Vital Signs BP (!) 142/67 (BP Location: Right Arm)   Pulse 64   Temp 98.8 F (37.1 C) (Oral)   Resp 16   Ht 5\' 5"  (1.651 m)   Wt 68 kg   SpO2 100%   BMI 24.95 kg/m   Physical Exam Vitals and nursing note reviewed.  Constitutional:      General: She is not in acute distress.    Appearance: She is well-developed. She is not diaphoretic.  HENT:     Head: Normocephalic and atraumatic.  Eyes:     Conjunctiva/sclera: Conjunctivae normal.  Cardiovascular:     Rate and Rhythm: Normal rate and regular rhythm.     Heart sounds: Normal heart sounds. No murmur heard.   No friction rub. No gallop.  Pulmonary:     Effort: Pulmonary effort is normal. No respiratory distress.     Breath sounds: Normal breath sounds. No wheezing or rales.  Abdominal:     General: There is no distension.     Palpations: Abdomen is soft.     Tenderness: There is no abdominal tenderness. There is no guarding.  Musculoskeletal:        General: No tenderness.     Cervical back: Normal range of motion.  Skin:    General: Skin is warm and dry.     Findings: No erythema or rash.  Neurological:     Mental Status: She is alert and oriented to person, place, and time.    ED Results / Procedures / Treatments   Labs (all labs ordered are listed, but only abnormal results are displayed) Labs Reviewed  CBC WITH DIFFERENTIAL/PLATELET - Abnormal; Notable for the following components:      Result Value   RBC 3.49 (*)    Hemoglobin 10.7 (*)  HCT 32.7 (*)    All other  components within normal limits  BASIC METABOLIC PANEL - Abnormal; Notable for the following components:   Glucose, Bld 132 (*)    BUN 24 (*)    Creatinine, Ser 1.35 (*)    GFR, Estimated 41 (*)    All other components within normal limits  TROPONIN I (HIGH SENSITIVITY)  TROPONIN I (HIGH SENSITIVITY)    EKG EKG Interpretation  Date/Time:  Wednesday August 24 2020 20:03:49 EDT Ventricular Rate:  69 PR Interval:  106 QRS Duration: 68 QT Interval:  388 QTC Calculation: 415 R Axis:   49 Text Interpretation: Sinus rhythm with short PR Otherwise normal ECG No significant change since last tracing Confirmed by Gareth Morgan [phone]) on 08/25/2020 7:34:02 AM  Radiology CT HEAD WO CONTRAST (5MM)  Result Date: 08/25/2020 CLINICAL DATA:  Headache EXAM: CT HEAD WITHOUT CONTRAST TECHNIQUE: Contiguous axial images were obtained from the base of the skull through the vertex without intravenous contrast. COMPARISON:  07/19/2020 FINDINGS: Brain: No acute intracranial abnormality. Specifically, no hemorrhage, hydrocephalus, mass lesion, acute infarction, or significant intracranial injury. Vascular: No hyperdense vessel or unexpected calcification. Skull: No acute calvarial abnormality. Sinuses/Orbits: No acute findings Other: None IMPRESSION: No acute intracranial abnormality. Electronically Signed   By: Rolm Baptise M.D.   On: 08/25/2020 10:14   DG Hand Complete Left  Result Date: 08/24/2020 CLINICAL DATA:  75 year old female with left hand pain. EXAM: LEFT HAND - COMPLETE 3+ VIEW COMPARISON:  None. FINDINGS: There is no acute fracture or dislocation. The bones are osteopenic. Degenerative changes of the interphalangeal joints. The soft tissues are unremarkable. IMPRESSION: No acute fracture or dislocation. Electronically Signed   By: Anner Crete M.D.   On: 08/24/2020 21:08    Procedures Procedures   Medications Ordered in ED Medications  prochlorperazine (COMPAZINE) tablet 10 mg (10 mg Oral  Given 08/25/20 6045)    ED Course  I have reviewed the triage vital signs and the nursing notes.  Pertinent labs & imaging results that were available during my care of the patient were reviewed by me and considered in my medical decision making (see chart for details).    MDM Rules/Calculators/A&P                             75 year old female with history of diabetes, hypertension, CVA, coronary artery disease who presents with concern for chest pain.  EKG evaluated by me without significant abnormalities, no signs of pericarditis or acute ST changes.  CBC shows a stable anemia.  BMP with stable creatinine and no significant electrolyte abnormalities.  An x-ray of her left hand showed no acute abnormalities.  Delta troponins are both negative, doubt ACS.  Low suspicion for PE or dissection by history , vital signs, exam.  Had recent CXR, no dyspnea, normal breath sounds and doubt pneumonia, pneumothorax, CHF exacerbation.  CT head without acute findings. Low suspicion for occult SAH.    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 (No sign of acute arterial or venous thrombus.)  Requesting diabetic shoes.  CSW consulted and PCP appt set up with transportation in a few weeks and on waiting list for sooner appointment. Patient discharged in stable condition with understanding of reasons to return.    Final Clinical Impression(s) / ED Diagnoses Final diagnoses:  Bilateral  leg pain  Acute nonintractable headache, unspecified headache type  Chest pain, unspecified type    Rx / DC Orders ED Discharge Orders          Ordered    penicillin v potassium (VEETID) 500 MG tablet  4 times daily        08/25/20 0923    PR [employer] SHOES HIGHTOP DEPTH I        08/25/20 2330             Gareth Morgan, MD 08/25/20 2144

## 2020-08-25 NOTE — Discharge Planning (Signed)
RNCM consulted regarding transportation needs for pt.  RNCM provided [facility] Transportation as pt has no transportation from hospital.  RNCM presented and explained [facility] Therapist, nutritional and Release of Liability Form.  Pt signed waiver, therefore agreeing to written terms.

## 2020-08-26 DIAGNOSIS — H524 Presbyopia: Secondary | ICD-10-CM | POA: Diagnosis not present

## 2020-08-29 ENCOUNTER — Emergency Department (HOSPITAL_COMMUNITY)
Admission: EM | Admit: 2020-08-29 | Discharge: 2020-08-30 | Disposition: A | Discharge status: Home / Self Care | Payer: Medicare Other | Attending: Emergency Medicine | Admitting: Emergency Medicine

## 2020-08-29 ENCOUNTER — Other Ambulatory Visit: Payer: Self-pay

## 2020-08-29 DIAGNOSIS — H538 Other visual disturbances: Secondary | ICD-10-CM | POA: Diagnosis not present

## 2020-08-29 DIAGNOSIS — Z79899 Other long term (current) drug therapy: Secondary | ICD-10-CM | POA: Insufficient documentation

## 2020-08-29 DIAGNOSIS — M1990 Unspecified osteoarthritis, unspecified site: Secondary | ICD-10-CM | POA: Diagnosis not present

## 2020-08-29 DIAGNOSIS — Z7902 Long term (current) use of antithrombotics/antiplatelets: Secondary | ICD-10-CM | POA: Diagnosis not present

## 2020-08-29 DIAGNOSIS — I10 Essential (primary) hypertension: Secondary | ICD-10-CM | POA: Diagnosis not present

## 2020-08-29 DIAGNOSIS — Z8616 Personal history of COVID-19: Secondary | ICD-10-CM | POA: Insufficient documentation

## 2020-08-29 DIAGNOSIS — Z794 Long term (current) use of insulin: Secondary | ICD-10-CM | POA: Insufficient documentation

## 2020-08-29 DIAGNOSIS — M79605 Pain in left leg: Secondary | ICD-10-CM | POA: Diagnosis not present

## 2020-08-29 DIAGNOSIS — Z955 Presence of coronary angioplasty implant and graft: Secondary | ICD-10-CM | POA: Insufficient documentation

## 2020-08-29 DIAGNOSIS — Z7984 Long term (current) use of oral hypoglycemic drugs: Secondary | ICD-10-CM | POA: Diagnosis not present

## 2020-08-29 DIAGNOSIS — E119 Type 2 diabetes mellitus without complications: Secondary | ICD-10-CM | POA: Diagnosis not present

## 2020-08-29 DIAGNOSIS — Z7982 Long term (current) use of aspirin: Secondary | ICD-10-CM | POA: Diagnosis not present

## 2020-08-29 DIAGNOSIS — M79604 Pain in right leg: Secondary | ICD-10-CM

## 2020-08-29 DIAGNOSIS — R519 Headache, unspecified: Secondary | ICD-10-CM

## 2020-08-29 NOTE — ED Triage Notes (Signed)
Pt states she has had a headache for about a week.  Suspected her BP was up.  Pt states she needs to be prescribed a "nerve pill" as she doesn't like to stay by herself.

## 2020-08-29 NOTE — ED Provider Notes (Signed)
Emergency Medicine Provider Triage Evaluation Note  [name] , a 75 y.o. female  was evaluated in triage.  Pt complains of a headache for the last week. Reports a few floaters.   Review of Systems  Positive: Headache, visual disturbances Negative: Falls, SOB, dizziness, syncope  Physical Exam  BP 125/61 (BP Location: Left Arm)   Pulse 69   Temp 97.8 F (36.6 C) (Oral)   Resp 16   Ht 5\' 5"  (1.651 m)   Wt 67.6 kg   SpO2 100%   BMI 24.79 kg/m  Gen:   Awake, no distress,  Resp:  Normal effort  Cardio:  RRR, no murmurs MSK:   Moves extremities without difficulty    Medical Decision Making  Medically screening exam initiated at 10:17 PM.  Appropriate orders placed.  [name] was informed that the remainder of the evaluation will be completed by another provider, this initial triage assessment does not replace that evaluation, and the importance of remaining in the ED until their evaluation is complete.    [name] 08/29/20 2223    2224, MD 08/29/20 2224

## 2020-08-30 ENCOUNTER — Encounter (HOSPITAL_COMMUNITY): Payer: Self-pay | Admitting: *Deleted

## 2020-08-30 ENCOUNTER — Emergency Department (HOSPITAL_COMMUNITY)
Admission: EM | Admit: 2020-08-30 | Discharge: 2020-08-30 | Disposition: A | Discharge status: Home / Self Care | Payer: Medicare Other | Source: Home / Self Care | Attending: Emergency Medicine | Admitting: Emergency Medicine

## 2020-08-30 DIAGNOSIS — Z8616 Personal history of COVID-19: Secondary | ICD-10-CM | POA: Insufficient documentation

## 2020-08-30 DIAGNOSIS — Z79899 Other long term (current) drug therapy: Secondary | ICD-10-CM | POA: Insufficient documentation

## 2020-08-30 DIAGNOSIS — Z7982 Long term (current) use of aspirin: Secondary | ICD-10-CM | POA: Insufficient documentation

## 2020-08-30 DIAGNOSIS — Z7984 Long term (current) use of oral hypoglycemic drugs: Secondary | ICD-10-CM | POA: Insufficient documentation

## 2020-08-30 DIAGNOSIS — Z955 Presence of coronary angioplasty implant and graft: Secondary | ICD-10-CM | POA: Insufficient documentation

## 2020-08-30 DIAGNOSIS — H538 Other visual disturbances: Secondary | ICD-10-CM

## 2020-08-30 DIAGNOSIS — M1990 Unspecified osteoarthritis, unspecified site: Secondary | ICD-10-CM | POA: Insufficient documentation

## 2020-08-30 DIAGNOSIS — I10 Essential (primary) hypertension: Secondary | ICD-10-CM | POA: Diagnosis not present

## 2020-08-30 DIAGNOSIS — E119 Type 2 diabetes mellitus without complications: Secondary | ICD-10-CM | POA: Insufficient documentation

## 2020-08-30 MED ORDER — ACETAMINOPHEN 325 MG PO TABS
650.0000 mg | ORAL_TABLET | Freq: Once | ORAL | Status: AC
  Administered 2020-08-30: 650 mg via ORAL
  Filled 2020-08-30: qty 2

## 2020-08-30 NOTE — ED Provider Notes (Signed)
[facility] EMERGENCY DEPARTMENT Provider Note   CSN: [id number] Arrival date & time: 08/29/20  2148     History Chief Complaint  Patient presents with   Headache    [name] is a 75 y.o. female.  Patient presenting with chronic bilateral leg pain which has been ongoing for years as well as headache.  Pain is worse in her feet but radiates throughout her whole leg with associated numbness and tingling in feet.  Pain is not particularly worse today and she has had no issues ambulating.  She also has some blurry vision which has been ongoing for months, patient states she has been to the eye doctor and has glasses on the way.  She has been using a lidocaine patch and diclofenac gel for her legs.  She is requesting a stronger lidocaine patch and a "mild nerve pill."  The history is provided by the patient. No language interpreter was used.  Headache     Past Medical History:  Diagnosis Date   Diabetes mellitus without complication ([place])    GERD 09/04/2006   Qualifier: Diagnosis of  By: Suzie Portela     History of echocardiogram    a. 2D ECHO: 11/06/2013 EF 65%; no WMA. Mild TR. 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   Numbness 06/20/2012   Diabetes mellitus ([place]) 06/20/2012   CVA (cerebral infarction) 06/20/2012   Hypokalemia 06/20/2012   ARTHRALGIA 01/28/2009   CHEST PAIN UNSPECIFIED 11/26/2008   DEPRESSION 09/04/2006   Essential hypertension 09/04/2006   GERD 09/04/2006   MULTIPLE SITES, ARTHRITIS-ACUTE/CHRONIC 09/04/2006    LOW BACK PAIN, CHRONIC 09/04/2006    Past Surgical History:  Procedure Laterality Date   ABDOMINAL HYSTERECTOMY     ANTERIOR CERVICAL DECOMPRESSION/DISCECTOMY FUSION 4 LEVELS N/A 11/11/2019   Procedure: Cervical three-four Cervical four-five Cervical five-six Cervical six-seven  Anterior cervical decompression/discectomy/fusion;  Surgeon: Erline Levine, MD;  Location: [facility];  Service: Neurosurgery;  Laterality: N/A;   LEFT HEART CATHETERIZATION WITH CORONARY ANGIOGRAM N/A 11/05/2013   Procedure: LEFT HEART CATHETERIZATION WITH CORONARY ANGIOGRAM;  Surgeon: Troy Sine, MD;  Location: [facility] CATH LAB;  Service: Cardiovascular;  Laterality: N/A;     OB History   No obstetric history on file.     No family history on file.  Social History   Tobacco Use   Smoking status: Never   Smokeless tobacco: Never  Vaping Use   Vaping Use: Never used  Substance Use Topics   Alcohol use: No   Drug use: No    Home Medications Prior to Admission medications   Medication Sig Start Date End Date Taking? Authorizing Provider  acetaminophen (TYLENOL) 325 MG tablet Take 2 tablets (650 mg total) by mouth every 6 (six) hours as needed for up to 30 doses for mild pain or moderate pain. Patient not taking: Reported on 08/25/2020 05/28/20   Wyvonnia Dusky, MD  albuterol (VENTOLIN HFA) 108 (90 Base) MCG/ACT inhaler Inhale 1-2 puffs into the lungs every 6 (six) hours as needed for wheezing or shortness  of breath. 04/22/20   Vevelyn Francois, NP  aspirin EC 81 MG tablet Take 81 mg by mouth daily. Swallow whole.    [provider]  Blood Glucose Monitoring Suppl (ONETOUCH VERIO) w/Device KIT Use 1-4 times daily as needed/directed DX E11.9 Patient not taking: No sig reported 04/25/20   Vevelyn Francois, NP  chlorthalidone (HYGROTON) 25 MG tablet Take 1 tablet (25 mg total) by mouth daily. 04/22/20   Vevelyn Francois, NP  cloNIDine (CATAPRES) 0.2 MG tablet Take 1 tablet (0.2 mg total) by mouth 2 (two) times  daily. 12/10/19   Jaynee Eagles, PA-C  clopidogrel (PLAVIX) 75 MG tablet Take 1 tablet (75 mg total) by mouth daily. 04/22/20   Vevelyn Francois, NP  diclofenac Sodium (VOLTAREN) 1 % GEL Apply 2 g topically 4 (four) times daily as needed. 07/27/20   Quintella Reichert, MD  diphenhydramine-acetaminophen (TYLENOL PM) 25-500 MG TABS tablet Take 1 tablet by mouth at bedtime.    [provider]  gabapentin (NEURONTIN) 300 MG capsule Take 1 capsule (300 mg total) by mouth daily for 1 day, THEN 1 capsule (300 mg total) 2 (two) times daily for 1 day, THEN 1 capsule (300 mg total) 3 (three) times daily. 07/28/20 10/28/20  Arnaldo Natal, MD  glipiZIDE (GLUCOTROL) 5 MG tablet Take 1 tablet (5 mg total) by mouth 2 (two) times daily before a meal. 04/22/20 04/22/21  Vevelyn Francois, NP  hydrALAZINE (APRESOLINE) 50 MG tablet Take 1 tablet (50 mg total) by mouth 2 (two) times daily. 04/22/20   Vevelyn Francois, NP  Lancets [facility] ULTRASOFT) lancets Use 1-4 times daily as needed/directed  DX E11.9 Patient taking differently: 1 each by Other route See admin instructions. Use 1-4 times daily as needed/directed  DX E11.9 04/25/20   Vevelyn Francois, NP  lidocaine (LIDODERM) 5 % Place 1 patch onto the skin daily. Remove & Discard patch within 12 hours or as directed by MD Patient not taking: No sig reported 04/22/20 04/22/21  Vevelyn Francois, NP  lidocaine (LIDODERM) 5 % Place 1 patch onto the skin daily. Remove & Discard patch within 12 hours or as directed by MD 07/21/20   Randal Buba, April, MD  losartan (COZAAR) 50 MG tablet Take 50 mg by mouth daily. 05/07/20   [provider]  metFORMIN (GLUCOPHAGE) 500 MG tablet Take 1.5 tablets (750 mg total) by mouth 2 (two) times daily with a meal. Patient taking differently: Take 1,000 mg by mouth 2 (two) times daily with a meal. 10/27/19 07/06/20  Veryl Speak, MD  methocarbamol (ROBAXIN) 500 MG tablet TAKE 1 TABLET BY MOUTH 4 TIMES DAILY. Patient taking differently: Take 500 mg by  mouth in the morning and at bedtime. 05/09/20   Vevelyn Francois, NP  MYRBETRIQ 25 MG TB24 tablet Take 25 mg by mouth daily. 05/18/20   [provider]  [facility] VERIO test strip Use 1-4 times daily as directed/needed   DX E11.9 Patient taking differently: 1 each by Other route See admin instructions. Use 1-4 times daily as directed/needed   DX E11.9 04/25/20   Vevelyn Francois, NP  oxyCODONE (ROXICODONE) 5 MG immediate release tablet Take 1 tablet (5 mg total) by mouth every 6 (six) hours as needed for up to 4 doses for severe pain. 05/28/20   Wyvonnia Dusky, MD  penicillin v potassium (VEETID) 500 MG tablet Take 1 tablet (500 mg total) by mouth 4 (four) times daily for 7 days. 08/25/20 09/01/20  Gareth Morgan, MD  potassium chloride (KLOR-CON) 10 MEQ tablet Take 1 tablet (10 mEq total) by mouth daily. 04/25/20   Noemi Chapel, MD  potassium chloride SA (KLOR-CON) 20 MEQ tablet Take 1 tablet (20 mEq total) by mouth 2 (two) times daily. 07/13/20   Fredia Sorrow, MD  rosuvastatin (CRESTOR) 10 MG tablet Take 1 tablet (10 mg total) by mouth at bedtime. 04/22/20 04/22/21  Vevelyn Francois, NP    Allergies    Codeine, Ibuprofen, and Imdur [isosorbide nitrate]  Review of Systems   Review of Systems  Eyes:  Positive for visual disturbance.  Neurological:  Positive for headaches.   Physical Exam Updated Vital Signs BP (!) 165/70 (BP Location: Left Arm)   Pulse 60   Temp 97.7 F (36.5 C) (Temporal)   Resp 16   Ht _0  (1.651 m)   Wt 67.6 kg   SpO2 100%   BMI 24.79 kg/m   Physical Exam Vitals and nursing note reviewed.  Constitutional:      General: She is not in acute distress.    Appearance: Normal appearance. She is well-developed. She is not ill-appearing or toxic-appearing.  HENT:     Head: Normocephalic and atraumatic.     Mouth/Throat:     Mouth: Mucous membranes are moist.     Pharynx: Oropharynx is clear.  Eyes:     Extraocular Movements: Extraocular movements intact.      Conjunctiva/sclera: Conjunctivae normal.     Pupils: Pupils are equal, round, and reactive to light.  Cardiovascular:     Rate and Rhythm: Normal rate and regular rhythm.     Pulses:          Dorsalis pedis pulses are 2+ on the right side and 2+ on the left side.       Posterior tibial pulses are 2+ on the right side and 2+ on the left side.     Heart sounds: No murmur heard. Pulmonary:     Effort: Pulmonary effort is normal. No respiratory distress.     Breath sounds: Normal breath sounds.  Abdominal:     Palpations: Abdomen is soft.     Tenderness: There is no abdominal tenderness.  Musculoskeletal:        General: No tenderness. Normal range of motion.     Cervical back: Neck supple.     Comments: Moving extremities without difficulty.  Skin:    General: Skin is warm and dry.     Comments: No wounds seen on feet.  Neurological:     Mental Status: She is alert.    ED Results / Procedures / Treatments   Labs (all labs ordered are listed, but only abnormal results are displayed) Labs Reviewed - No data to display  EKG EKG Interpretation  Date/Time:  Monday August 29 2020 22:21:41 EDT Ventricular Rate:  62 PR Interval:  118 QRS Duration: 66 QT Interval:  408 QTC Calculation: 414 R Axis:   48 Text Interpretation: Normal sinus rhythm Normal ECG When compared with ECG of 08/24/2020, No significant change was found Confirmed by Delora Fuel ([id number]) on 08/30/2020 1:14:25 AM  Radiology No results found.  Procedures Procedures   Medications Ordered in ED Medications - No data to display  ED Course  I have reviewed the triage vital signs and the nursing notes.  Pertinent labs & imaging results that were available during my care of the patient were reviewed by me and considered in my medical decision making (see chart for details).  MDM Rules/Calculators/A&P                         75 year old female with history of T2DM, HTN, CVA, CAD who presents with chronic  bilateral leg pain and headache.  Leg pain most likely secondary to diabetic neuropathy, patient already has a gabapentin prescription.  Patient was recently evaluated in the ED for similar concerns on 8/4 and had a CT head done at that time which was unremarkable.  Blurred vision appears to be a chronic issue and unrelated to headache.  No need for repeat imaging at this time.  Patient needs follow-up with her PCP, but patient concerned about proximity to bus stop so information for [facility] health community health and wellness also given.  Social work also consulted to assist with PCP follow-up.  Stable for discharge at this time.  Final Clinical Impression(s) / ED Diagnoses Final diagnoses:  Bilateral leg pain  Nonintractable headache, unspecified chronicity pattern, unspecified headache type    Rx / DC Orders ED Discharge Orders     None        Zola Button, MD 08/30/20 8110    Lucrezia Starch, MD 08/31/20 [phone]

## 2020-08-30 NOTE — ED Triage Notes (Signed)
Pt complains of bilateral feet pain and headache x 1 week. She would like to be prescribed a nerve pill.

## 2020-08-30 NOTE — ED Provider Notes (Signed)
[facility] DEPT Provider Note   CSN: [id number] Arrival date & time: 08/30/20  1327     History Chief Complaint  Patient presents with   Headache   Foot Pain    [name] is a 75 y.o. female.  Patient is a 75 year old female presenting for bilateral knee pain, hip pain, and blurry vision.  Patient mitts to bilateral knee pain and hip pain over the last 7 to 8 months, intermittent, while walking or bending the knee, began after falling last year.  States she is using Lidoderm pain patches.  Denies any previous history of arthritis.  Denies motrin or tylenol use.   Patient  also presenting for blurry vision.  Patient states has been progressive over the past 3 to 4 years.  States she is followed with an ophthalmologist who recommends cataract surgery.  Denies any decreased vision.  Denies eye pain.  Denies trauma to the eye or any station of foreign bodies.  The history is provided by the patient. No language interpreter was used.  Eye Problem Location:  Both eyes Quality: no pain, blurry vision. Severity:  Moderate Onset quality:  Gradual Duration: years. Associated symptoms: no discharge, no itching, no nausea, no numbness, no redness and no weakness       Past Medical History:  Diagnosis Date   Diabetes mellitus without complication ([place])    GERD 09/04/2006   Qualifier: Diagnosis of  By: Suzie Portela     History of echocardiogram    a. 2D ECHO: 11/06/2013 EF 65%; no WMA. Mild TR. 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[place])  06/24/2012   Numbness 06/20/2012   Diabetes mellitus ([place]) 06/20/2012   CVA (cerebral infarction) 06/20/2012   Hypokalemia 06/20/2012   ARTHRALGIA 01/28/2009   CHEST PAIN UNSPECIFIED 11/26/2008   DEPRESSION 09/04/2006   Essential hypertension 09/04/2006   GERD 09/04/2006   MULTIPLE SITES, ARTHRITIS-ACUTE/CHRONIC 09/04/2006   LOW BACK PAIN, CHRONIC 09/04/2006    Past Surgical History:  Procedure Laterality Date   ABDOMINAL HYSTERECTOMY     ANTERIOR CERVICAL DECOMPRESSION/DISCECTOMY FUSION 4 LEVELS N/A 11/11/2019   Procedure: Cervical three-four Cervical four-five Cervical five-six Cervical six-seven  Anterior cervical decompression/discectomy/fusion;  Surgeon: Erline Levine, MD;  Location: [facility];  Service: Neurosurgery;  Laterality: N/A;   LEFT HEART CATHETERIZATION WITH CORONARY ANGIOGRAM N/A 11/05/2013   Procedure: LEFT HEART CATHETERIZATION WITH CORONARY ANGIOGRAM;  Surgeon: Troy Sine, MD;  Location: [facility] CATH LAB;  Service: Cardiovascular;  Laterality: N/A;     OB History   No obstetric history on file.     No family history on file.  Social History   Tobacco Use   Smoking status: Never   Smokeless tobacco: Never  Vaping Use   Vaping Use: Never used  Substance Use Topics   Alcohol use: No   Drug use: No    Home Medications Prior to Admission medications   Medication Sig Start Date End Date Taking? Authorizing Provider  acetaminophen (TYLENOL) 325 MG tablet Take 2 tablets (650 mg total) by mouth every 6 (  six) hours as needed for up to 30 doses for mild pain or moderate pain. Patient not taking: Reported on 08/25/2020 05/28/20   Wyvonnia Dusky, MD  albuterol (VENTOLIN HFA) 108 (90 Base) MCG/ACT inhaler Inhale 1-2 puffs into the lungs every 6 (six) hours as needed for wheezing or shortness of breath. 04/22/20   Vevelyn Francois, NP  aspirin EC 81 MG tablet Take 81 mg by mouth daily. Swallow whole.    [provider]  Blood Glucose Monitoring Suppl (ONETOUCH  VERIO) w/Device KIT Use 1-4 times daily as needed/directed DX E11.9 Patient not taking: No sig reported 04/25/20   Vevelyn Francois, NP  chlorthalidone (HYGROTON) 25 MG tablet Take 1 tablet (25 mg total) by mouth daily. 04/22/20   Vevelyn Francois, NP  cloNIDine (CATAPRES) 0.2 MG tablet Take 1 tablet (0.2 mg total) by mouth 2 (two) times daily. 12/10/19   Jaynee Eagles, PA-C  clopidogrel (PLAVIX) 75 MG tablet Take 1 tablet (75 mg total) by mouth daily. 04/22/20   Vevelyn Francois, NP  diclofenac Sodium (VOLTAREN) 1 % GEL Apply 2 g topically 4 (four) times daily as needed. 07/27/20   Quintella Reichert, MD  diphenhydramine-acetaminophen (TYLENOL PM) 25-500 MG TABS tablet Take 1 tablet by mouth at bedtime.    [provider]  gabapentin (NEURONTIN) 300 MG capsule Take 1 capsule (300 mg total) by mouth daily for 1 day, THEN 1 capsule (300 mg total) 2 (two) times daily for 1 day, THEN 1 capsule (300 mg total) 3 (three) times daily. 07/28/20 10/28/20  Arnaldo Natal, MD  glipiZIDE (GLUCOTROL) 5 MG tablet Take 1 tablet (5 mg total) by mouth 2 (two) times daily before a meal. 04/22/20 04/22/21  Vevelyn Francois, NP  hydrALAZINE (APRESOLINE) 50 MG tablet Take 1 tablet (50 mg total) by mouth 2 (two) times daily. 04/22/20   Vevelyn Francois, NP  Lancets [facility] ULTRASOFT) lancets Use 1-4 times daily as needed/directed  DX E11.9 Patient taking differently: 1 each by Other route See admin instructions. Use 1-4 times daily as needed/directed  DX E11.9 04/25/20   Vevelyn Francois, NP  lidocaine (LIDODERM) 5 % Place 1 patch onto the skin daily. Remove & Discard patch within 12 hours or as directed by MD Patient not taking: No sig reported 04/22/20 04/22/21  Vevelyn Francois, NP  lidocaine (LIDODERM) 5 % Place 1 patch onto the skin daily. Remove & Discard patch within 12 hours or as directed by MD 07/21/20   Randal Buba, April, MD  losartan (COZAAR) 50 MG tablet Take 50 mg by mouth daily. 05/07/20   [provider]  metFORMIN  (GLUCOPHAGE) 500 MG tablet Take 1.5 tablets (750 mg total) by mouth 2 (two) times daily with a meal. Patient taking differently: Take 1,000 mg by mouth 2 (two) times daily with a meal. 10/27/19 07/06/20  Veryl Speak, MD  methocarbamol (ROBAXIN) 500 MG tablet TAKE 1 TABLET BY MOUTH 4 TIMES DAILY. Patient taking differently: Take 500 mg by mouth in the morning and at bedtime. 05/09/20   Vevelyn Francois, NP  MYRBETRIQ 25 MG TB24 tablet Take 25 mg by mouth daily. 05/18/20   [provider]  [facility] VERIO test strip Use 1-4 times daily as directed/needed   DX E11.9 Patient taking differently: 1 each by Other route See admin instructions. Use 1-4 times daily as directed/needed   DX E11.9 04/25/20   Vevelyn Francois, NP  oxyCODONE (ROXICODONE) 5 MG immediate release tablet Take  1 tablet (5 mg total) by mouth every 6 (six) hours as needed for up to 4 doses for severe pain. 05/28/20   Wyvonnia Dusky, MD  penicillin v potassium (VEETID) 500 MG tablet Take 1 tablet (500 mg total) by mouth 4 (four) times daily for 7 days. 08/25/20 09/01/20  Gareth Morgan, MD  potassium chloride (KLOR-CON) 10 MEQ tablet Take 1 tablet (10 mEq total) by mouth daily. 04/25/20   Noemi Chapel, MD  potassium chloride SA (KLOR-CON) 20 MEQ tablet Take 1 tablet (20 mEq total) by mouth 2 (two) times daily. 07/13/20   Fredia Sorrow, MD  rosuvastatin (CRESTOR) 10 MG tablet Take 1 tablet (10 mg total) by mouth at bedtime. 04/22/20 04/22/21  Vevelyn Francois, NP    Allergies    Codeine, Ibuprofen, and Imdur [isosorbide nitrate]  Review of Systems   Review of Systems  Constitutional:  Negative for chills and fever.  Eyes:  Positive for visual disturbance. Negative for discharge, redness and itching.  Gastrointestinal:  Negative for abdominal pain and nausea.  Genitourinary:  Negative for dysuria and hematuria.  Musculoskeletal:  Positive for arthralgias. Negative for back pain and gait problem.  Skin:  Negative for color change and  rash.  Neurological:  Negative for weakness and numbness.  All other systems reviewed and are negative.  Physical Exam Updated Vital Signs BP (!) 150/60 (BP Location: Left Arm)   Pulse 68   Temp 98.7 F (37.1 C) (Oral)   Resp 16   SpO2 99%   Physical Exam Vitals and nursing note reviewed.  HENT:     Head: Normocephalic and atraumatic.  Eyes:     General: Lids are normal. Lids are everted, no foreign bodies appreciated. Vision grossly intact. Gaze aligned appropriately.     Extraocular Movements: Extraocular movements intact.     Conjunctiva/sclera: Conjunctivae normal.     Right eye: Right conjunctiva is not injected. No chemosis or hemorrhage.    Left eye: Left conjunctiva is not injected. No chemosis or hemorrhage.    Pupils: Pupils are equal, round, and reactive to light.     Visual Fields: Right eye visual fields normal and left eye visual fields normal.  Cardiovascular:     Rate and Rhythm: Normal rate and regular rhythm.  Pulmonary:     Effort: Pulmonary effort is normal.     Breath sounds: Normal breath sounds.  Musculoskeletal:     Right upper leg: No edema, deformity or bony tenderness.     Left upper leg: No edema, deformity or bony tenderness.     Right knee: No swelling, deformity or bony tenderness.     Left knee: No swelling, deformity or bony tenderness.     Right lower leg: No swelling, deformity or bony tenderness.     Left lower leg: No swelling, deformity or bony tenderness.     Right ankle: No swelling or deformity. No tenderness.     Left ankle: No swelling or deformity. No tenderness.     Right foot: Normal pulse.     Left foot: Normal pulse.  Neurological:     Mental Status: She is alert.     GCS: GCS eye subscore is 4. GCS verbal subscore is 5. GCS motor subscore is 6.  Psychiatric:        Mood and Affect: Mood normal.        Behavior: Behavior normal.    ED Results / Procedures / Treatments   Labs (all labs ordered are listed,  but only  abnormal results are displayed) Labs Reviewed - No data to display  EKG None  Radiology No results found.  Procedures Procedures   Medications Ordered in ED Medications - No data to display  ED Course  I have reviewed the triage vital signs and the nursing notes.  Pertinent labs & imaging results that were available during my care of the patient were reviewed by me and considered in my medical decision making (see chart for details).    MDM Rules/Calculators/A&P                           75 year old female presenting for bilateral knee pain, hip pain, and blurry vision.  Is alert and oriented x3, no acute distress, afebrile, stable vital signs.  Admits to bilateral knee and hip pain x several months after fall last year.  Denies any acute falls or injuries.  Exam demonstrates no swelling, warmth, erythema, or wounds.  No reproducible bony tenderness. Patient neurovascularly intact. No difficulties with ambulation. Patient's pain likely secondary to arthritis. Declining xray imaging at this time. Request ace wrap for bilateral knees. Ace wrap provided with tylenol.    Patient also expresses concern for blurry vision that has been progressive for 3-4 years. States she was recommended by opthalmology for "lens surgery" and states "I want your second opinion". Denies any acute changes. No concerning physical exam findings. Symptoms likely age related. Low suspicion for any acute/sight jeopardizing illnesses. Patient recommended for follow up with opthalmology for blurry vision. New provider contact information given for second opinion. All questions answered prior to discharge.   Final Clinical Impression(s) / ED Diagnoses Final diagnoses:  Arthritis  Blurry vision, bilateral    Rx / DC Orders ED Discharge Orders     None        Lianne Cure, DO 85/46/27 2103

## 2020-08-30 NOTE — Discharge Instructions (Addendum)
Please follow-up with your PCP Thad Ranger, NP as soon as possible. Blood pressure looks good today. You have already been prescribed a nerve pill called gabapentin by a previous doctor. Return to the ED if you develop chest pain or shortness of breath.

## 2020-09-01 ENCOUNTER — Emergency Department (HOSPITAL_COMMUNITY)
Admission: EM | Admit: 2020-09-01 | Discharge: 2020-09-01 | Disposition: A | Discharge status: Home / Self Care | Payer: Medicare Other | Attending: Emergency Medicine | Admitting: Emergency Medicine

## 2020-09-01 ENCOUNTER — Other Ambulatory Visit: Payer: Self-pay

## 2020-09-01 ENCOUNTER — Encounter (HOSPITAL_COMMUNITY): Payer: Self-pay

## 2020-09-01 ENCOUNTER — Emergency Department (HOSPITAL_COMMUNITY)
Admission: EM | Admit: 2020-09-01 | Discharge: 2020-09-02 | Disposition: A | Discharge status: Home / Self Care | Payer: Medicare Other | Source: Home / Self Care | Attending: Emergency Medicine | Admitting: Emergency Medicine

## 2020-09-01 ENCOUNTER — Emergency Department (HOSPITAL_COMMUNITY): Payer: Medicare Other

## 2020-09-01 DIAGNOSIS — M79672 Pain in left foot: Secondary | ICD-10-CM | POA: Insufficient documentation

## 2020-09-01 DIAGNOSIS — Z8616 Personal history of COVID-19: Secondary | ICD-10-CM | POA: Insufficient documentation

## 2020-09-01 DIAGNOSIS — Z955 Presence of coronary angioplasty implant and graft: Secondary | ICD-10-CM | POA: Insufficient documentation

## 2020-09-01 DIAGNOSIS — E119 Type 2 diabetes mellitus without complications: Secondary | ICD-10-CM | POA: Insufficient documentation

## 2020-09-01 DIAGNOSIS — Z743 Need for continuous supervision: Secondary | ICD-10-CM | POA: Diagnosis not present

## 2020-09-01 DIAGNOSIS — Z79899 Other long term (current) drug therapy: Secondary | ICD-10-CM | POA: Insufficient documentation

## 2020-09-01 DIAGNOSIS — R6889 Other general symptoms and signs: Secondary | ICD-10-CM | POA: Diagnosis not present

## 2020-09-01 DIAGNOSIS — Z7902 Long term (current) use of antithrombotics/antiplatelets: Secondary | ICD-10-CM | POA: Diagnosis not present

## 2020-09-01 DIAGNOSIS — R072 Precordial pain: Secondary | ICD-10-CM | POA: Insufficient documentation

## 2020-09-01 DIAGNOSIS — M791 Myalgia, unspecified site: Secondary | ICD-10-CM | POA: Insufficient documentation

## 2020-09-01 DIAGNOSIS — M25512 Pain in left shoulder: Secondary | ICD-10-CM | POA: Insufficient documentation

## 2020-09-01 DIAGNOSIS — Z7982 Long term (current) use of aspirin: Secondary | ICD-10-CM | POA: Insufficient documentation

## 2020-09-01 DIAGNOSIS — I10 Essential (primary) hypertension: Secondary | ICD-10-CM | POA: Insufficient documentation

## 2020-09-01 DIAGNOSIS — Z7984 Long term (current) use of oral hypoglycemic drugs: Secondary | ICD-10-CM | POA: Insufficient documentation

## 2020-09-01 DIAGNOSIS — I499 Cardiac arrhythmia, unspecified: Secondary | ICD-10-CM | POA: Diagnosis not present

## 2020-09-01 DIAGNOSIS — R079 Chest pain, unspecified: Secondary | ICD-10-CM | POA: Diagnosis not present

## 2020-09-01 DIAGNOSIS — R0789 Other chest pain: Secondary | ICD-10-CM | POA: Diagnosis not present

## 2020-09-01 LAB — I-STAT CHEM 8, ED
BUN: 19 mg/dL (ref 8–23)
Calcium, Ion: 1.06 mmol/L — ABNORMAL LOW (ref 1.15–1.40)
Chloride: 98 mmol/L (ref 98–111)
Creatinine, Ser: 1.2 mg/dL — ABNORMAL HIGH (ref 0.44–1.00)
Glucose, Bld: 127 mg/dL — ABNORMAL HIGH (ref 70–99)
HCT: 30 % — ABNORMAL LOW (ref 36.0–46.0)
Hemoglobin: 10.2 g/dL — ABNORMAL LOW (ref 12.0–15.0)
Potassium: 3.4 mmol/L — ABNORMAL LOW (ref 3.5–5.1)
Sodium: 138 mmol/L (ref 135–145)
TCO2: 29 mmol/L (ref 22–32)

## 2020-09-01 LAB — TROPONIN I (HIGH SENSITIVITY)
Troponin I (High Sensitivity): 8 ng/L (ref <18)
Troponin I (High Sensitivity): 10 ng/L (ref <18)

## 2020-09-01 MED ORDER — ACETAMINOPHEN 325 MG PO TABS
650.0000 mg | ORAL_TABLET | Freq: Once | ORAL | Status: AC
  Administered 2020-09-01: 650 mg via ORAL
  Filled 2020-09-01: qty 2

## 2020-09-01 NOTE — ED Provider Notes (Signed)
Emergency Medicine Provider Triage Evaluation Note  [name] , a 75 y.o. female  was evaluated in triage.  Pt complains of chest pain and headache. Seen for same recently. Chest pain is central, nonradiating, and described as a pressure. Headache is recurrent and feels like "someone is standing on my head". Given ASA and SL NTG x 1 by EMS PTA. No fevers, syncope, leg swelling, vomiting, diaphoresis. Hx of frequent ED visits for similar complaints.  Review of Systems  Positive: Chest pain, headache Negative: Fever, syncope, vomiting  Physical Exam  BP (!) 159/69 (BP Location: Right Arm)   Pulse 73   Temp 99.3 F (37.4 C) (Oral)   Resp 18   SpO2 98%  Gen:   Awake, no distress   Resp:  Normal effort  MSK:   Moves extremities without difficulty  Other:  Respirations even and unlabored.  Medical Decision Making  Medically screening exam initiated at 4:15 AM.  Appropriate orders placed.  Alli Jasmer was informed that the remainder of the evaluation will be completed by another provider, this initial triage assessment does not replace that evaluation, and the importance of remaining in the ED until their evaluation is complete.  Chest pain Headache    Antony Madura, PA-C 09/01/20 0417    Tilden Fossa, MD 09/01/20 [phone]

## 2020-09-01 NOTE — ED Triage Notes (Signed)
Brought in by [facility] from home - c/o hypertension, chest pain (midsternal, pressure )  and headache x 1 day pt also took ASA 324  and 1 nitro BP 164/78. G20 left ac. 12 lead unremarkable.

## 2020-09-01 NOTE — Discharge Instructions (Addendum)
Work-up for the chest pain here today without any acute findings.  Make an appointment to follow-up with cardiology.  Continue taking your Plavix medication.  Return for any new or worse symptoms.

## 2020-09-01 NOTE — ED Triage Notes (Signed)
Pt reports headache, bilateral leg and foot pain x 1 week.

## 2020-09-01 NOTE — ED Notes (Signed)
Pt reports that does not have a ride home and needs one. Pt able to ambulate with assistance and can sit in w/c so not a candidate for PTAR. CN will be made aware.

## 2020-09-01 NOTE — Discharge Instructions (Addendum)
Continue current medications follow-up with your primary care doctor for further evaluation

## 2020-09-01 NOTE — ED Provider Notes (Signed)
[facility] EMERGENCY DEPARTMENT Provider Note   CSN: [id number] Arrival date & time: 09/01/20  0410     History Chief Complaint  Patient presents with   Chest Pain    [name] is a 75 y.o. female.  Patient brought in by EMS.  Patient with complaint of midsternal chest pain.  Worried about her blood pressure being high.  Complaint of a headache for a day.  Headache is now resolved.  Patient given aspirin 324 mg and 1 nitro.  Blood pressure for EMS was 164/78.  Most recent blood pressure here is 151/60.  Oxygen saturation on room air is 97% patient seen frequently in the emergency department for multiple complaints.  Does not seem that patient is regularly followed by cardiology.  Past medical history is significant for diabetes hypertension and stroke and gastroesophageal reflux disease.  Patient had a cardiac catheterization in 2015.      Past Medical History:  Diagnosis Date   Diabetes mellitus without complication ([place])    GERD 09/04/2006   Qualifier: Diagnosis of  By: Suzie Portela     History of echocardiogram    a. 2D ECHO: 11/06/2013 EF 65%; no WMA. Mild TR. 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   Numbness 06/20/2012   Diabetes mellitus ([place]) 06/20/2012   CVA (cerebral infarction) 06/20/2012   Hypokalemia 06/20/2012   ARTHRALGIA 01/28/2009   CHEST PAIN UNSPECIFIED 11/26/2008   DEPRESSION 09/04/2006   Essential hypertension 09/04/2006   GERD 09/04/2006   MULTIPLE SITES, ARTHRITIS-ACUTE/CHRONIC 09/04/2006   LOW BACK  PAIN, CHRONIC 09/04/2006    Past Surgical History:  Procedure Laterality Date   ABDOMINAL HYSTERECTOMY     ANTERIOR CERVICAL DECOMPRESSION/DISCECTOMY FUSION 4 LEVELS N/A 11/11/2019   Procedure: Cervical three-four Cervical four-five Cervical five-six Cervical six-seven  Anterior cervical decompression/discectomy/fusion;  Surgeon: Erline Levine, MD;  Location: [place];  Service: Neurosurgery;  Laterality: N/A;   LEFT HEART CATHETERIZATION WITH CORONARY ANGIOGRAM N/A 11/05/2013   Procedure: LEFT HEART CATHETERIZATION WITH CORONARY ANGIOGRAM;  Surgeon: Troy Sine, MD;  Location[facility] CATH LAB;  Service: Cardiovascular;  Laterality: N/A;     OB History   No obstetric history on file.     History reviewed. No pertinent family history.  Social History   Tobacco Use   Smoking status: Never   Smokeless tobacco: Never  Vaping Use   Vaping Use: Never used  Substance Use Topics   Alcohol use: No   Drug use: No    Home Medications Prior to Admission medications   Medication Sig Start Date End Date Taking? Authorizing Provider  albuterol (VENTOLIN HFA) 108 (90 Base) MCG/ACT inhaler Inhale 1-2 puffs into the lungs every 6 (six) hours as needed for wheezing or shortness of breath. 04/22/20  Yes Vevelyn Francois, NP  aspirin EC 81 MG tablet Take 81 mg by mouth daily. Swallow whole.   Yes [provider]  chlorthalidone (HYGROTON) 25 MG tablet Take 1 tablet (25 mg total) by mouth daily. 04/22/20  Yes Vevelyn Francois, NP  clopidogrel (PLAVIX) 75 MG tablet Take 1 tablet (75 mg total) by mouth daily. 04/22/20  Yes Vevelyn Francois, NP  diclofenac Sodium (VOLTAREN) 1 % GEL Apply 2 g topically 4 (four) times daily as needed. 07/27/20  Yes Quintella Reichert, MD  diphenhydramine-acetaminophen (TYLENOL PM) 25-500 MG TABS tablet Take 1 tablet by mouth at bedtime.   Yes [provider]  diphenhydramine-acetaminophen (TYLENOL PM) 25-500 MG TABS tablet Take 1 tablet by mouth at bedtime as needed  (sleep and pain).   Yes [provider]  gabapentin (NEURONTIN) 100 MG capsule Take 100 mg by mouth 3 (three) times daily.   Yes [provider]  glipiZIDE (GLUCOTROL) 5 MG tablet Take 1 tablet (5 mg total) by mouth 2 (two) times daily before a meal. 04/22/20 04/22/21 Yes Vevelyn Francois, NP  hydrALAZINE (APRESOLINE) 50 MG tablet Take 1 tablet (50 mg total) by mouth 2 (two) times daily. 04/22/20  Yes Vevelyn Francois, NP  losartan (COZAAR) 50 MG tablet Take 50 mg by mouth daily. 05/07/20  Yes [provider]  metFORMIN (GLUCOPHAGE) 500 MG tablet Take 1.5 tablets (750 mg total) by mouth 2 (two) times daily with a meal. Patient taking differently: Take 1,000 mg by mouth 2 (two) times daily with a meal. 10/27/19 09/01/20 Yes Delo, Nathaneil Canary, MD  oxyCODONE (ROXICODONE) 5 MG immediate release tablet Take 1 tablet (5 mg total) by mouth every 6 (six) hours as needed for up to 4 doses for severe pain. 05/28/20  Yes Trifan, Carola Rhine, MD  penicillin v potassium (VEETID) 500 MG tablet Take 1 tablet (500 mg total) by mouth 4 (four) times daily for 7 days. 08/25/20 09/01/20 Yes Schlossman, Junie Panning, MD  potassium chloride (MICRO-K) 10 MEQ CR capsule Take 10 mEq by mouth daily.   Yes [provider]  rosuvastatin (CRESTOR) 10 MG tablet Take 1 tablet (10 mg total) by mouth at bedtime. 04/22/20 04/22/21 Yes Vevelyn Francois, NP  acetaminophen (TYLENOL) 325 MG tablet Take 2 tablets (650 mg total) by mouth every 6 (six) hours as needed for up to 30 doses for mild pain or moderate pain. Patient not taking: No sig reported 05/28/20   Wyvonnia Dusky, MD  Blood Glucose Monitoring Suppl [facility] VERIO) w/Device KIT Use 1-4 times daily as needed/directed DX E11.9 Patient not taking: No sig reported 04/25/20   Vevelyn Francois, NP  cloNIDine (CATAPRES) 0.2 MG tablet Take 1 tablet (0.2 mg total) by mouth 2 (two) times daily. Patient not taking: Reported on 09/01/2020 12/10/19   Jaynee Eagles, PA-C  gabapentin  (NEURONTIN) 300 MG capsule Take 1 capsule (300 mg total) by mouth daily for 1 day, THEN 1 capsule (300 mg total) 2 (two) times daily for 1 day, THEN 1 capsule (300 mg total) 3 (three) times daily. Patient not taking: Reported on 09/01/2020 07/28/20 10/28/20  Arnaldo Natal, MD  Lancets [facility] ULTRASOFT) lancets Use 1-4 times daily as needed/directed  DX E11.9 Patient not taking: No sig reported 04/25/20   Vevelyn Francois, NP  lidocaine (LIDODERM) 5 % Place 1 patch onto the skin daily. Remove & Discard patch within 12 hours or as directed by MD Patient not taking: No sig reported 04/22/20 04/22/21  Vevelyn Francois, NP  lidocaine (LIDODERM) 5 % Place 1 patch onto the skin daily. Remove & Discard patch within 12 hours or as directed by MD Patient not taking: Reported on 09/01/2020 07/21/20   Palumbo, April, MD  methocarbamol (ROBAXIN) 500 MG tablet  TAKE 1 TABLET BY MOUTH 4 TIMES DAILY. Patient not taking: No sig reported 05/09/20   Vevelyn Francois, NP  [facility] VERIO test strip Use 1-4 times daily as directed/needed   DX E11.9 Patient not taking: No sig reported 04/25/20   Vevelyn Francois, NP  potassium chloride (KLOR-CON) 10 MEQ tablet Take 1 tablet (10 mEq total) by mouth daily. Patient not taking: Reported on 09/01/2020 04/25/20   Noemi Chapel, MD  potassium chloride SA (KLOR-CON) 20 MEQ tablet Take 1 tablet (20 mEq total) by mouth 2 (two) times daily. Patient not taking: Reported on 09/01/2020 07/13/20   Fredia Sorrow, MD    Allergies    Codeine, Ibuprofen, and Imdur [isosorbide nitrate]  Review of Systems   Review of Systems  Constitutional:  Negative for chills and fever.  HENT:  Negative for ear pain and sore throat.   Eyes:  Negative for pain and visual disturbance.  Respiratory:  Negative for cough and shortness of breath.   Cardiovascular:  Positive for chest pain. Negative for palpitations.  Gastrointestinal:  Negative for abdominal pain and vomiting.  Genitourinary:  Negative for dysuria and  hematuria.  Musculoskeletal:  Negative for arthralgias and back pain.  Skin:  Negative for color change and rash.  Neurological:  Negative for seizures and syncope.  All other systems reviewed and are negative.  Physical Exam Updated Vital Signs BP (!) 169/71   Pulse 73   Temp 97.9 F (36.6 C)   Resp 12   SpO2 98%   Physical Exam Vitals and nursing note reviewed.  Constitutional:      General: She is not in acute distress.    Appearance: Normal appearance. She is well-developed.  HENT:     Head: Normocephalic and atraumatic.  Eyes:     Extraocular Movements: Extraocular movements intact.     Conjunctiva/sclera: Conjunctivae normal.     Pupils: Pupils are equal, round, and reactive to light.  Neck:     Comments: Patient with a healing incision with some fresh scab left anterior neck probably secondary to discectomy that was done in October 2021 Cardiovascular:     Rate and Rhythm: Normal rate and regular rhythm.     Heart sounds: No murmur heard. Pulmonary:     Effort: Pulmonary effort is normal. No respiratory distress.     Breath sounds: Normal breath sounds.  Chest:     Chest wall: No tenderness.  Abdominal:     Palpations: Abdomen is soft.     Tenderness: There is no abdominal tenderness.  Musculoskeletal:        General: Normal range of motion.     Cervical back: Neck supple.  Skin:    General: Skin is warm and dry.  Neurological:     General: No focal deficit present.     Mental Status: She is alert and oriented to person, place, and time.     Cranial Nerves: No cranial nerve deficit.     Sensory: No sensory deficit.    ED Results / Procedures / Treatments   Labs (all labs ordered are listed, but only abnormal results are displayed) Labs Reviewed  I-STAT CHEM 8, ED - Abnormal; Notable for the following components:      Result Value   Potassium 3.4 (*)    Creatinine, Ser 1.20 (*)    Glucose, Bld 127 (*)    Calcium, Ion 1.06 (*)    Hemoglobin 10.2 (*)     HCT 30.0 (*)  All other components within normal limits  TROPONIN I (HIGH SENSITIVITY)  TROPONIN I (HIGH SENSITIVITY)    EKG None  Radiology DG Chest 2 View  Result Date: 09/01/2020 CLINICAL DATA:  75 year old female with chest pain and hypertension. EXAM: CHEST - 2 VIEW COMPARISON:  Chest radiograph 08/15/2020 and earlier. FINDINGS: PA and lateral views. Lung volumes and mediastinal contours are stable and within normal limits. Visualized tracheal air column is within normal limits. Lung markings appear stable. No pneumothorax, pulmonary edema, pleural effusion or confluent pulmonary opacity. Stable visualized osseous structures. Prior cervical ACDF. Negative visible bowel gas pattern. IMPRESSION: No acute cardiopulmonary abnormality. Electronically Signed   By: Genevie Ann M.D.   On: 09/01/2020 05:08    Procedures Procedures   Medications Ordered in ED Medications  acetaminophen (TYLENOL) tablet 650 mg (650 mg Oral Given 09/01/20 0416)    ED Course  I have reviewed the triage vital signs and the nursing notes.  Pertinent labs & imaging results that were available during my care of the patient were reviewed by me and considered in my medical decision making (see chart for details).    MDM Rules/Calculators/A&P                           Work-up for the chest pain without any acute findings.  Patient needs to follow-up with cardiology.  Troponins are stable.  EKG without acute findings.  Chest x-ray without any acute abnormalities.  Patient stable for discharge home follow-up cardiology.  Blood pressure with improvement.  Although close follow-up and monitoring of it would be important.   Final Clinical Impression(s) / ED Diagnoses Final diagnoses:  Precordial pain    Rx / DC Orders ED Discharge Orders     None        Fredia Sorrow, MD 09/01/20 1512

## 2020-09-01 NOTE — ED Notes (Signed)
Patient discharge instructions reviewed with the patient. The patient verbalized understanding of instructions. Patient discharged. 

## 2020-09-01 NOTE — Discharge Planning (Signed)
RNCM consulted regarding transportation needs for pt.  RNCM provided [place] [facility] as pt has no transportation from hospital.  RNCM presented and explained [facility] Waiver and Release of Liability Form.  Pt signed waiver, therefore agreeing to written terms.    

## 2020-09-01 NOTE — ED Provider Notes (Signed)
[facility] DEPT Provider Note   CSN: [id number] Arrival date & time: 09/01/20  1750     History Chief Complaint  Patient presents with   Extremity Weakness    [name] is a 75 y.o. female.   Extremity Weakness   Patient presents ED for evaluation of aches and pains in her left shoulder and her feet.  Patient states she has had shoulder pain for a long time.  She is fell several months ago and had x-rays.  She states it never has completely healed.  Patient also states she is having some aches and pains in her feet.  It feels better when her legs are wrapped.  She also think she needs to get diabetic shoes.  Patient denies any fevers or chills.  She is not having any numbness or weakness.  She denies any leg swelling.  No recent falls or injuries.  Past Medical History:  Diagnosis Date   Diabetes mellitus without complication ([place])    GERD 09/04/2006   Qualifier: Diagnosis of  By: Suzie Portela     History of echocardiogram    a. 2D ECHO: 11/06/2013 EF 65%; no WMA. Mild TR. 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   Numbness 06/20/2012   Diabetes mellitus ([place]) 06/20/2012   CVA (cerebral infarction) 06/20/2012   Hypokalemia 06/20/2012   ARTHRALGIA 01/28/2009   CHEST PAIN UNSPECIFIED 11/26/2008   DEPRESSION 09/04/2006   Essential hypertension 09/04/2006   GERD 09/04/2006   MULTIPLE SITES, ARTHRITIS-ACUTE/CHRONIC 09/04/2006   LOW BACK PAIN, CHRONIC 09/04/2006    Past Surgical History:  Procedure Laterality Date    ABDOMINAL HYSTERECTOMY     ANTERIOR CERVICAL DECOMPRESSION/DISCECTOMY FUSION 4 LEVELS N/A 11/11/2019   Procedure: Cervical three-four Cervical four-five Cervical five-six Cervical six-seven  Anterior cervical decompression/discectomy/fusion;  Surgeon: Erline Levine, MD;  Location: [facility];  Service: Neurosurgery;  Laterality: N/A;   LEFT HEART CATHETERIZATION WITH CORONARY ANGIOGRAM N/A 11/05/2013   Procedure: LEFT HEART CATHETERIZATION WITH CORONARY ANGIOGRAM;  Surgeon: Troy Sine, MD;  Location: [facility] CATH LAB;  Service: Cardiovascular;  Laterality: N/A;     OB History   No obstetric history on file.     History reviewed. No pertinent family history.  Social History   Tobacco Use   Smoking status: Never   Smokeless tobacco: Never  Vaping Use   Vaping Use: Never used  Substance Use Topics   Alcohol use: No   Drug use: No    Home Medications Prior to Admission medications   Medication Sig Start Date End Date Taking? Authorizing Provider  acetaminophen (TYLENOL) 325 MG tablet Take 2 tablets (650 mg total) by mouth every 6 (six) hours as needed for up to 30 doses for mild pain or moderate pain. Patient not taking: No sig reported 05/28/20   Wyvonnia Dusky, MD  albuterol (VENTOLIN HFA) 108 (90 Base) MCG/ACT inhaler Inhale 1-2 puffs into the lungs every 6 (six) hours as needed for wheezing or shortness of breath. 04/22/20   Vevelyn Francois, NP  aspirin  EC 81 MG tablet Take 81 mg by mouth daily. Swallow whole.    [provider]  Blood Glucose Monitoring Suppl (ONETOUCH VERIO) w/Device KIT Use 1-4 times daily as needed/directed DX E11.9 Patient not taking: No sig reported 04/25/20   Vevelyn Francois, NP  chlorthalidone (HYGROTON) 25 MG tablet Take 1 tablet (25 mg total) by mouth daily. 04/22/20   Vevelyn Francois, NP  cloNIDine (CATAPRES) 0.2 MG tablet Take 1 tablet (0.2 mg total) by mouth 2 (two) times daily. Patient not taking: Reported on 09/01/2020 12/10/19   Jaynee Eagles, PA-C   clopidogrel (PLAVIX) 75 MG tablet Take 1 tablet (75 mg total) by mouth daily. 04/22/20   Vevelyn Francois, NP  diclofenac Sodium (VOLTAREN) 1 % GEL Apply 2 g topically 4 (four) times daily as needed. 07/27/20   Quintella Reichert, MD  diphenhydramine-acetaminophen (TYLENOL PM) 25-500 MG TABS tablet Take 1 tablet by mouth at bedtime.    [provider]  diphenhydramine-acetaminophen (TYLENOL PM) 25-500 MG TABS tablet Take 1 tablet by mouth at bedtime as needed (sleep and pain).    [provider]  gabapentin (NEURONTIN) 100 MG capsule Take 100 mg by mouth 3 (three) times daily.    [provider]  gabapentin (NEURONTIN) 300 MG capsule Take 1 capsule (300 mg total) by mouth daily for 1 day, THEN 1 capsule (300 mg total) 2 (two) times daily for 1 day, THEN 1 capsule (300 mg total) 3 (three) times daily. Patient not taking: Reported on 09/01/2020 07/28/20 10/28/20  Arnaldo Natal, MD  glipiZIDE (GLUCOTROL) 5 MG tablet Take 1 tablet (5 mg total) by mouth 2 (two) times daily before a meal. 04/22/20 04/22/21  Vevelyn Francois, NP  hydrALAZINE (APRESOLINE) 50 MG tablet Take 1 tablet (50 mg total) by mouth 2 (two) times daily. 04/22/20   Vevelyn Francois, NP  Lancets [facility] ULTRASOFT) lancets Use 1-4 times daily as needed/directed  DX E11.9 Patient not taking: No sig reported 04/25/20   Vevelyn Francois, NP  lidocaine (LIDODERM) 5 % Place 1 patch onto the skin daily. Remove & Discard patch within 12 hours or as directed by MD Patient not taking: No sig reported 04/22/20 04/22/21  Vevelyn Francois, NP  lidocaine (LIDODERM) 5 % Place 1 patch onto the skin daily. Remove & Discard patch within 12 hours or as directed by MD Patient not taking: Reported on 09/01/2020 07/21/20   Palumbo, April, MD  losartan (COZAAR) 50 MG tablet Take 50 mg by mouth daily. 05/07/20   [provider]  metFORMIN (GLUCOPHAGE) 500 MG tablet Take 1.5 tablets (750 mg total) by mouth 2 (two) times daily with a meal. Patient  taking differently: Take 1,000 mg by mouth 2 (two) times daily with a meal. 10/27/19 09/01/20  Veryl Speak, MD  methocarbamol (ROBAXIN) 500 MG tablet TAKE 1 TABLET BY MOUTH 4 TIMES DAILY. Patient not taking: No sig reported 05/09/20   Vevelyn Francois, NP  [facility] VERIO test strip Use 1-4 times daily as directed/needed   DX E11.9 Patient not taking: No sig reported 04/25/20   Vevelyn Francois, NP  oxyCODONE (ROXICODONE) 5 MG immediate release tablet Take 1 tablet (5 mg total) by mouth every 6 (six) hours as needed for up to 4 doses for severe pain. 05/28/20   Wyvonnia Dusky, MD  penicillin v potassium (VEETID) 500 MG tablet Take 1 tablet (500 mg total) by mouth 4 (four) times daily for 7 days. 08/25/20 09/01/20  Gareth Morgan, MD  potassium chloride (KLOR-CON) 10 MEQ tablet Take 1 tablet (10 mEq total) by mouth daily. Patient not taking: Reported on 09/01/2020 04/25/20   Noemi Chapel, MD  potassium chloride (MICRO-K) 10 MEQ CR capsule Take 10 mEq by mouth daily.    [provider]  potassium chloride SA (KLOR-CON) 20 MEQ tablet Take 1 tablet (20 mEq total) by mouth 2 (two) times daily. Patient not taking: Reported on 09/01/2020 07/13/20   Fredia Sorrow, MD  rosuvastatin (CRESTOR) 10 MG tablet Take 1 tablet (10 mg total) by mouth at bedtime. 04/22/20 04/22/21  Vevelyn Francois, NP    Allergies    Codeine, Ibuprofen, and Imdur [isosorbide nitrate]  Review of Systems   Review of Systems  Musculoskeletal:  Positive for extremity weakness.  All other systems reviewed and are negative.  Physical Exam Updated Vital Signs BP (!) 187/75 (BP Location: Right Arm)   Pulse 68   Temp 98.3 F (36.8 C) (Oral)   Resp 20   Ht 1.651 m (_0 )   Wt 67 kg   SpO2 99%   BMI 24.58 kg/m   Physical Exam Vitals and nursing note reviewed.  Constitutional:      General: She is not in acute distress.    Appearance: She is well-developed.  HENT:     Head: Normocephalic and atraumatic.     Right Ear:  External ear normal.     Left Ear: External ear normal.  Eyes:     General: No scleral icterus.       Right eye: No discharge.        Left eye: No discharge.     Conjunctiva/sclera: Conjunctivae normal.  Neck:     Trachea: No tracheal deviation.  Cardiovascular:     Rate and Rhythm: Normal rate and regular rhythm.  Pulmonary:     Effort: Pulmonary effort is normal. No respiratory distress.     Breath sounds: Normal breath sounds. No stridor. No wheezing or rales.  Abdominal:     General: Bowel sounds are normal. There is no distension.     Palpations: Abdomen is soft.     Tenderness: There is no abdominal tenderness. There is no guarding or rebound.  Musculoskeletal:        General: No tenderness or deformity.     Cervical back: Neck supple.     Comments: Bilateral lower extremities without erythema or edema, extremities are warm and well-perfused, no ulcerations, mild tenderness palpation with range of motion left shoulder, no deformity  Skin:    General: Skin is warm and dry.     Findings: No rash.  Neurological:     General: No focal deficit present.     Mental Status: She is alert.     Cranial Nerves: No cranial nerve deficit (no facial droop, extraocular movements intact, no slurred speech).     Sensory: No sensory deficit.     Motor: No abnormal muscle tone or seizure activity.     Coordination: Coordination normal.  Psychiatric:        Mood and Affect: Mood normal.    ED Results / Procedures / Treatments   Labs (all labs ordered are listed, but only abnormal results are displayed) Labs Reviewed - No data to display  EKG None  Radiology DG Chest 2 View  Result Date: 09/01/2020 CLINICAL DATA:  75 year old female with chest pain and hypertension. EXAM: CHEST - 2 VIEW COMPARISON:  Chest radiograph 08/15/2020 and earlier. FINDINGS: PA and  lateral views. Lung volumes and mediastinal contours are stable and within normal limits. Visualized tracheal air column is within  normal limits. Lung markings appear stable. No pneumothorax, pulmonary edema, pleural effusion or confluent pulmonary opacity. Stable visualized osseous structures. Prior cervical ACDF. Negative visible bowel gas pattern. IMPRESSION: No acute cardiopulmonary abnormality. Electronically Signed   By: Genevie Ann M.D.   On: 09/01/2020 05:08    Procedures Procedures   Medications Ordered in ED Medications  acetaminophen (TYLENOL) tablet 650 mg (has no administration in time range)    ED Course  I have reviewed the triage vital signs and the nursing notes.  Pertinent labs & imaging results that were available during my care of the patient were reviewed by me and considered in my medical decision making (see chart for details).    MDM Rules/Calculators/A&P                           Prior records have been reviewed.  Patient frequently visits the ED for various complaints.  She has been to the ED 27 times in the last 6 months.  Patient was actually seen in the emergency room earlier today today at [facility] because she was having difficulties with chest pain as well as elevation of blood pressure and headache.  Patient had laboratory testing, EKG and x-rays today.  Patient did have a chest x-ray that demonstrated no acute abnormality in the shoulder on my review.  Patient's exam is reassuring.  She appears to be having some myalgias but there is no signs of infection, vascular compromise or other acute abnormality.  Patient appears stable for discharge.   Final Clinical Impression(s) / ED Diagnoses Final diagnoses:  Myalgia     Dorie Rank, MD 09/01/20 2217

## 2020-09-01 NOTE — ED Provider Notes (Signed)
Emergency Medicine Provider Triage Evaluation Note  [name] , a 75 y.o. female  was evaluated in triage.  Pt complains of pain to multiple areas of her body including her arms and legs. States she had a headache earlier. She was seen earlier today in the ED  Review of Systems  Positive: Generalized body aches, headache Negative: Chest pain  Physical Exam  BP (!) 213/78   Pulse 69   Temp 98.5 F (36.9 C)   Resp 20   Ht 5\' 5"  (1.651 m)   Wt 67 kg   SpO2 96%   BMI 24.58 kg/m  Gen:   Awake, no distress   Resp:  Normal effort  MSK:   Moves extremities without difficulty  Other:  Clear speech  Medical Decision Making  Medically screening exam initiated at 6:52 PM.  Appropriate orders placed.  [name] was informed that the remainder of the evaluation will be completed by another provider, this initial triage assessment does not replace that evaluation, and the importance of remaining in the ED until their evaluation is complete.     [name] 09/01/20 1853    11/01/20, MD 09/04/20 1341

## 2020-09-02 NOTE — ED Notes (Signed)
Pt will be going home in cab. Will wait for cab in [place]

## 2020-09-06 ENCOUNTER — Other Ambulatory Visit: Payer: Self-pay

## 2020-09-06 ENCOUNTER — Encounter (HOSPITAL_COMMUNITY): Payer: Self-pay

## 2020-09-06 ENCOUNTER — Emergency Department (HOSPITAL_COMMUNITY)
Admission: EM | Admit: 2020-09-06 | Discharge: 2020-09-07 | Disposition: A | Discharge status: Home / Self Care | Payer: Medicare Other | Attending: Emergency Medicine | Admitting: Emergency Medicine

## 2020-09-06 DIAGNOSIS — Z7984 Long term (current) use of oral hypoglycemic drugs: Secondary | ICD-10-CM | POA: Insufficient documentation

## 2020-09-06 DIAGNOSIS — R519 Headache, unspecified: Secondary | ICD-10-CM | POA: Insufficient documentation

## 2020-09-06 DIAGNOSIS — M79671 Pain in right foot: Secondary | ICD-10-CM | POA: Insufficient documentation

## 2020-09-06 DIAGNOSIS — M542 Cervicalgia: Secondary | ICD-10-CM | POA: Diagnosis not present

## 2020-09-06 DIAGNOSIS — Z8616 Personal history of COVID-19: Secondary | ICD-10-CM | POA: Insufficient documentation

## 2020-09-06 DIAGNOSIS — M79672 Pain in left foot: Secondary | ICD-10-CM | POA: Insufficient documentation

## 2020-09-06 DIAGNOSIS — Z79899 Other long term (current) drug therapy: Secondary | ICD-10-CM | POA: Insufficient documentation

## 2020-09-06 DIAGNOSIS — Z7982 Long term (current) use of aspirin: Secondary | ICD-10-CM | POA: Insufficient documentation

## 2020-09-06 DIAGNOSIS — Z794 Long term (current) use of insulin: Secondary | ICD-10-CM | POA: Insufficient documentation

## 2020-09-06 DIAGNOSIS — E119 Type 2 diabetes mellitus without complications: Secondary | ICD-10-CM | POA: Diagnosis not present

## 2020-09-06 DIAGNOSIS — I10 Essential (primary) hypertension: Secondary | ICD-10-CM | POA: Diagnosis not present

## 2020-09-06 DIAGNOSIS — Z7902 Long term (current) use of antithrombotics/antiplatelets: Secondary | ICD-10-CM | POA: Diagnosis not present

## 2020-09-06 DIAGNOSIS — G8929 Other chronic pain: Secondary | ICD-10-CM | POA: Diagnosis not present

## 2020-09-06 NOTE — ED Provider Notes (Signed)
Emergency Medicine Provider Triage Evaluation Note  [name] , a 75 y.o. female  was evaluated in triage.  Pt complains of headache and neck pain for the past 3 months.  She does have history of cervical stenosis and headaches.  Has been seen several times recently for various complaints.  Had a head CT on 08/25/20 for same.  She takes daily ASA but no other anticoagulation.  Denies any change in her symptoms, just ongoing discomfort.  Review of Systems  Positive: Headache, neck pain Negative: Fever, chills  Physical Exam  BP (!) 152/68 (BP Location: Right Arm)   Pulse (!) 58   Temp 97.6 F (36.4 C)   Resp 16   Ht 5\' 5"  (1.651 m)   Wt 67 kg   SpO2 100%   BMI 24.58 kg/m  Gen:   Awake, no distress   Resp:  Normal effort  MSK:   Moves extremities without difficulty, ambulatory in triage Other:    Medical Decision Making  Medically screening exam initiated at 10:13 PM.  Appropriate orders placed.  Thecla Forgione was informed that the remainder of the evaluation will be completed by another provider, this initial triage assessment does not replace that evaluation, and the importance of remaining in the ED until their evaluation is complete.     [name], PA-C 09/06/20 2218    2219, DO 09/06/20 2322

## 2020-09-06 NOTE — ED Triage Notes (Signed)
Ambulatory to triage c/o headache and neck pain x 2-3 weeks.

## 2020-09-07 DIAGNOSIS — R519 Headache, unspecified: Secondary | ICD-10-CM | POA: Diagnosis not present

## 2020-09-07 MED ORDER — OXYCODONE-ACETAMINOPHEN 5-325 MG PO TABS
1.0000 | ORAL_TABLET | Freq: Once | ORAL | Status: AC
  Administered 2020-09-07: 1 via ORAL
  Filled 2020-09-07: qty 1

## 2020-09-07 NOTE — ED Notes (Signed)
Patient verbalizes understanding of discharge instructions. Opportunity for questioning and answers were provided. Armband removed by staff, pt discharged from ED via wheelchair.  

## 2020-09-07 NOTE — ED Provider Notes (Signed)
[facility] EMERGENCY DEPARTMENT Provider Note   CSN: [id number] Arrival date & time: 09/06/20  2152     History Chief Complaint  Patient presents with   Headache   Neck Pain    [name] is a 75 y.o. female.  Patient presents with complaints of headache and bilateral foot pain.  Patient reports that she has chronic pain in her neck and it is causing her head to hurt.  She takes Tylenol PM when she goes to bed to help her sleep but it did not help her pain tonight.  Pain in the neck and head seems to be a chronic problem.  Additionally she has chronic problems with pain in both of her feet   Headache Associated symptoms: neck pain   Neck Pain Associated symptoms: headaches       Past Medical History:  Diagnosis Date   Diabetes mellitus without complication ([place])    GERD 09/04/2006   Qualifier: Diagnosis of  By: Suzie Portela     History of echocardiogram    a. 2D ECHO: 11/06/2013 EF 65%; no WMA. Mild TR. 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   Numbness 06/20/2012   Diabetes mellitus ([place]) 06/20/2012   CVA (cerebral infarction) 06/20/2012   Hypokalemia 06/20/2012   ARTHRALGIA 01/28/2009   CHEST PAIN UNSPECIFIED 11/26/2008   DEPRESSION 09/04/2006   Essential hypertension 09/04/2006   GERD 09/04/2006   MULTIPLE SITES, ARTHRITIS-ACUTE/CHRONIC 09/04/2006   LOW BACK PAIN, CHRONIC 09/04/2006    Past Surgical History:  Procedure Laterality Date   ABDOMINAL HYSTERECTOMY     ANTERIOR CERVICAL DECOMPRESSION/DISCECTOMY  FUSION 4 LEVELS N/A 11/11/2019   Procedure: Cervical three-four Cervical four-five Cervical five-six Cervical six-seven  Anterior cervical decompression/discectomy/fusion;  Surgeon: Erline Levine, MD;  Location: [place];  Service: Neurosurgery;  Laterality: N/A;   LEFT HEART CATHETERIZATION WITH CORONARY ANGIOGRAM N/A 11/05/2013   Procedure: LEFT HEART CATHETERIZATION WITH CORONARY ANGIOGRAM;  Surgeon: Troy Sine, MD;  Location: [facility] CATH LAB;  Service: Cardiovascular;  Laterality: N/A;     OB History   No obstetric history on file.     History reviewed. No pertinent family history.  Social History   Tobacco Use   Smoking status: Never   Smokeless tobacco: Never  Vaping Use   Vaping Use: Never used  Substance Use Topics   Alcohol use: No   Drug use: No    Home Medications Prior to Admission medications   Medication Sig Start Date End Date Taking? Authorizing Provider  acetaminophen (TYLENOL) 325 MG tablet Take 2 tablets (650 mg total) by mouth every 6 (six) hours as needed for up to 30 doses for mild pain or moderate pain. Patient not taking: No sig reported 05/28/20   Wyvonnia Dusky, MD  albuterol (VENTOLIN HFA) 108 (90 Base) MCG/ACT inhaler Inhale 1-2 puffs into the lungs every 6 (six) hours as needed for wheezing or shortness of breath. 04/22/20   Vevelyn Francois, NP  aspirin EC 81 MG tablet Take 81 mg by mouth daily. Swallow whole.  [provider]  Blood Glucose Monitoring Suppl (ONETOUCH VERIO) w/Device KIT Use 1-4 times daily as needed/directed DX E11.9 Patient not taking: No sig reported 04/25/20   Vevelyn Francois, NP  chlorthalidone (HYGROTON) 25 MG tablet Take 1 tablet (25 mg total) by mouth daily. 04/22/20   Vevelyn Francois, NP  cloNIDine (CATAPRES) 0.2 MG tablet Take 1 tablet (0.2 mg total) by mouth 2 (two) times daily. Patient not taking: Reported on 09/01/2020 12/10/19   Jaynee Eagles, PA-C  clopidogrel (PLAVIX) 75 MG tablet Take 1 tablet (75 mg total) by mouth  daily. 04/22/20   Vevelyn Francois, NP  diclofenac Sodium (VOLTAREN) 1 % GEL Apply 2 g topically 4 (four) times daily as needed. 07/27/20   Quintella Reichert, MD  diphenhydramine-acetaminophen (TYLENOL PM) 25-500 MG TABS tablet Take 1 tablet by mouth at bedtime.    [provider]  diphenhydramine-acetaminophen (TYLENOL PM) 25-500 MG TABS tablet Take 1 tablet by mouth at bedtime as needed (sleep and pain).    [provider]  gabapentin (NEURONTIN) 100 MG capsule Take 100 mg by mouth 3 (three) times daily.    [provider]  gabapentin (NEURONTIN) 300 MG capsule Take 1 capsule (300 mg total) by mouth daily for 1 day, THEN 1 capsule (300 mg total) 2 (two) times daily for 1 day, THEN 1 capsule (300 mg total) 3 (three) times daily. Patient not taking: Reported on 09/01/2020 07/28/20 10/28/20  Arnaldo Natal, MD  glipiZIDE (GLUCOTROL) 5 MG tablet Take 1 tablet (5 mg total) by mouth 2 (two) times daily before a meal. 04/22/20 04/22/21  Vevelyn Francois, NP  hydrALAZINE (APRESOLINE) 50 MG tablet Take 1 tablet (50 mg total) by mouth 2 (two) times daily. 04/22/20   Vevelyn Francois, NP  Lancets [facility] ULTRASOFT) lancets Use 1-4 times daily as needed/directed  DX E11.9 Patient not taking: No sig reported 04/25/20   Vevelyn Francois, NP  lidocaine (LIDODERM) 5 % Place 1 patch onto the skin daily. Remove & Discard patch within 12 hours or as directed by MD Patient not taking: No sig reported 04/22/20 04/22/21  Vevelyn Francois, NP  lidocaine (LIDODERM) 5 % Place 1 patch onto the skin daily. Remove & Discard patch within 12 hours or as directed by MD Patient not taking: Reported on 09/01/2020 07/21/20   Palumbo, April, MD  losartan (COZAAR) 50 MG tablet Take 50 mg by mouth daily. 05/07/20   [provider]  metFORMIN (GLUCOPHAGE) 500 MG tablet Take 1.5 tablets (750 mg total) by mouth 2 (two) times daily with a meal. Patient taking differently: Take 1,000 mg by mouth 2 (two) times daily with a meal.  10/27/19 09/01/20  Veryl Speak, MD  methocarbamol (ROBAXIN) 500 MG tablet TAKE 1 TABLET BY MOUTH 4 TIMES DAILY. Patient not taking: No sig reported 05/09/20   Vevelyn Francois, NP  [facility] VERIO test strip Use 1-4 times daily as directed/needed   DX E11.9 Patient not taking: No sig reported 04/25/20   Vevelyn Francois, NP  oxyCODONE (ROXICODONE) 5 MG immediate release tablet Take 1 tablet (5 mg total) by mouth every 6 (six) hours as needed for up to 4 doses for severe pain. 05/28/20   Wyvonnia Dusky, MD  potassium chloride (KLOR-CON) 10 MEQ tablet Take 1 tablet (10 mEq total) by mouth daily. Patient not taking: Reported on 09/01/2020 04/25/20   Noemi Chapel, MD  potassium chloride (MICRO-K) 10 MEQ CR capsule Take 10 mEq by mouth  daily.    [provider]  potassium chloride SA (KLOR-CON) 20 MEQ tablet Take 1 tablet (20 mEq total) by mouth 2 (two) times daily. Patient not taking: Reported on 09/01/2020 07/13/20   Fredia Sorrow, MD  rosuvastatin (CRESTOR) 10 MG tablet Take 1 tablet (10 mg total) by mouth at bedtime. 04/22/20 04/22/21  Vevelyn Francois, NP    Allergies    Codeine, Ibuprofen, and Imdur [isosorbide nitrate]  Review of Systems   Review of Systems  Musculoskeletal:  Positive for neck pain.  Neurological:  Positive for headaches.  All other systems reviewed and are negative.  Physical Exam Updated Vital Signs BP 128/64 (BP Location: Right Arm)   Pulse 63   Temp 97.6 F (36.4 C)   Resp 16   Ht $R'5\' 5"'eH$  (1.651 m)   Wt 67 kg   SpO2 98%   BMI 24.58 kg/m   Physical Exam Vitals and nursing note reviewed.  Constitutional:      General: She is not in acute distress.    Appearance: Normal appearance. She is well-developed.  HENT:     Head: Normocephalic and atraumatic.     Right Ear: Hearing normal.     Left Ear: Hearing normal.     Nose: Nose normal.  Eyes:     Conjunctiva/sclera: Conjunctivae normal.     Pupils: Pupils are equal, round, and reactive to light.   Cardiovascular:     Rate and Rhythm: Regular rhythm.     Pulses:          Dorsalis pedis pulses are 1+ on the right side and 1+ on the left side.     Heart sounds: S1 normal and S2 normal. No murmur heard.   No friction rub. No gallop.  Pulmonary:     Effort: Pulmonary effort is normal. No respiratory distress.     Breath sounds: Normal breath sounds.  Chest:     Chest wall: No tenderness.  Abdominal:     General: Bowel sounds are normal.     Palpations: Abdomen is soft.     Tenderness: There is no abdominal tenderness. There is no guarding or rebound. Negative signs include Murphy's sign and McBurney's sign.     Hernia: No hernia is present.  Musculoskeletal:     Cervical back: Neck supple. Pain with movement present. Decreased range of motion.  Skin:    General: Skin is warm and dry.     Findings: No rash.  Neurological:     Mental Status: She is alert and oriented to person, place, and time.     GCS: GCS eye subscore is 4. GCS verbal subscore is 5. GCS motor subscore is 6.     Cranial Nerves: No cranial nerve deficit.     Sensory: No sensory deficit.     Coordination: Coordination normal.  Psychiatric:        Speech: Speech normal.        Behavior: Behavior normal.        Thought Content: Thought content normal.    ED Results / Procedures / Treatments   Labs (all labs ordered are listed, but only abnormal results are displayed) Labs Reviewed - No data to display  EKG None  Radiology No results found.  Procedures Procedures   Medications Ordered in ED Medications - No data to display  ED Course  I have reviewed the triage vital signs and the nursing notes.  Pertinent labs & imaging results that were available during my care  of the patient were reviewed by me and considered in my medical decision making (see chart for details).    MDM Rules/Calculators/A&P                           Patient presents with complaints of neck pain, headache, bilateral foot  pain.  Reviewing records reveals numerous visits with identical complaints.  Patient has a history of cervical stenosis with chronic neck pain and recurrent headaches.  Patient with normal strength of both upper extremities.  No loss of sensation.  No red flags that would warrant further work-up.  These problems have been worked up in the past.  Patient indicates that she does not have a doctor currently and that is why she keeps coming to the ER with her complaints.  This has also been addressed by social work in the past.  Final Clinical Impression(s) / ED Diagnoses Final diagnoses:  Other chronic pain    Rx / DC Orders ED Discharge Orders     None        Jaydee Conran, Gwenyth Allegra, MD 09/07/20 [phone]

## 2020-09-08 ENCOUNTER — Other Ambulatory Visit: Payer: Self-pay

## 2020-09-08 ENCOUNTER — Emergency Department (HOSPITAL_COMMUNITY)
Admission: EM | Admit: 2020-09-08 | Discharge: 2020-09-09 | Disposition: A | Discharge status: Home / Self Care | Payer: Medicare Other | Attending: Emergency Medicine | Admitting: Emergency Medicine

## 2020-09-08 DIAGNOSIS — Z7902 Long term (current) use of antithrombotics/antiplatelets: Secondary | ICD-10-CM | POA: Diagnosis not present

## 2020-09-08 DIAGNOSIS — I10 Essential (primary) hypertension: Secondary | ICD-10-CM | POA: Diagnosis not present

## 2020-09-08 DIAGNOSIS — R519 Headache, unspecified: Secondary | ICD-10-CM | POA: Insufficient documentation

## 2020-09-08 DIAGNOSIS — Z79899 Other long term (current) drug therapy: Secondary | ICD-10-CM | POA: Insufficient documentation

## 2020-09-08 DIAGNOSIS — Z7982 Long term (current) use of aspirin: Secondary | ICD-10-CM | POA: Diagnosis not present

## 2020-09-08 DIAGNOSIS — Z7984 Long term (current) use of oral hypoglycemic drugs: Secondary | ICD-10-CM | POA: Insufficient documentation

## 2020-09-08 DIAGNOSIS — E119 Type 2 diabetes mellitus without complications: Secondary | ICD-10-CM | POA: Insufficient documentation

## 2020-09-08 DIAGNOSIS — M542 Cervicalgia: Secondary | ICD-10-CM | POA: Insufficient documentation

## 2020-09-08 NOTE — ED Triage Notes (Signed)
Pt reports headache and neck pain, ongoing x 2-3 weeks. Evaluated for same two days ago.

## 2020-09-08 NOTE — ED Provider Notes (Signed)
Emergency Medicine Provider Triage Evaluation Note  [name] , a 75 y.o. female  was evaluated in triage.  Pt complains of neck pain and headache.  Symptoms have been ongoing over the last 2 to 3 weeks.  Patient was evaluated for the same 2 days prior.  Patient denies any facial asymmetry, aphasia, dysphagia, numbness, weakness, visual disturbance.  No recent falls or injuries.  Patient states that her neck pain is causing headache.  Review of Systems  Positive: Neck pain, headache Negative: cial asymmetry, aphasia, dysphagia, numbness, weakness, visual disturbance  Physical Exam  BP 114/76 (BP Location: Right Arm)   Pulse 81   Temp 98.4 F (36.9 C) (Oral)   Resp 16   SpO2 100%  Gen:   Awake, no distress   Resp:  Normal effort  MSK:   Moves extremities without difficulty  Other:  CN II through XII intact.  Patient able to stand and ambulate without difficulty.  Grip strength equal, pronator drift negative, sensation to light touch intact to bilateral upper and lower extremities.  No aphasia or dysphagia.  Medical Decision Making  Medically screening exam initiated at 6:30 PM.  Appropriate orders placed.  Mylei Brackeen was informed that the remainder of the evaluation will be completed by another provider, this initial triage assessment does not replace that evaluation, and the importance of remaining in the ED until their evaluation is complete.  The patient appears stable so that the remainder of the work up may be completed by another provider.      Haskel Schroeder, PA-C 09/08/20 1832    Franne Forts, DO 09/09/20 0021

## 2020-09-09 ENCOUNTER — Emergency Department (HOSPITAL_COMMUNITY): Payer: Medicare Other

## 2020-09-09 DIAGNOSIS — M542 Cervicalgia: Secondary | ICD-10-CM | POA: Diagnosis not present

## 2020-09-09 MED ORDER — TRAMADOL HCL 50 MG PO TABS: 50.0000 mg | ORAL_TABLET | Freq: Four times a day (QID) | ORAL | 0 refills | Status: DC | PRN

## 2020-09-09 MED ORDER — ACETAMINOPHEN 500 MG PO TABS
1000.0000 mg | ORAL_TABLET | Freq: Once | ORAL | Status: AC
  Administered 2020-09-09: 1000 mg via ORAL
  Filled 2020-09-09: qty 2

## 2020-09-09 MED ORDER — CYCLOBENZAPRINE HCL 10 MG PO TABS
5.0000 mg | ORAL_TABLET | Freq: Once | ORAL | Status: AC
  Administered 2020-09-09: 5 mg via ORAL
  Filled 2020-09-09: qty 1

## 2020-09-09 NOTE — ED Provider Notes (Signed)
[facility] EMERGENCY DEPARTMENT Provider Note   CSN: [id number] Arrival date & time: 09/08/20  1822     History Chief Complaint  Patient presents with   Headache    [name] is a 75 y.o. female.  Patient is a 75 year old female with past medical history of diabetes, GERD, hypertension, prior stroke.  She presents today with complaints of neck pain.  She describes pain to the lower cervical region that began in the absence of any injury or trauma.  She is unable to say exactly how long this has been bothering her, but it sounds as though it is more of a chronic problem.  She denies any numbness or tingling to her hands or feet.  She denies any bowel or bladder complaints.  Patient frequents the ER as with various issues and this is her seventh visit in the month of August alone.  The history is provided by the patient.      Past Medical History:  Diagnosis Date   Diabetes mellitus without complication ([place])    GERD 09/04/2006   Qualifier: Diagnosis of  By: Suzie Portela     History of echocardiogram    a. 2D ECHO: 11/06/2013 EF 65%; no WMA. Mild TR. 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   Numbness 06/20/2012   Diabetes mellitus ([place]) 06/20/2012   CVA (cerebral infarction) 06/20/2012   Hypokalemia 06/20/2012   ARTHRALGIA 01/28/2009   CHEST PAIN UNSPECIFIED 11/26/2008   DEPRESSION 09/04/2006   Essential hypertension 09/04/2006   GERD 09/04/2006   MULTIPLE SITES, ARTHRITIS-ACUTE/CHRONIC 09/04/2006   LOW BACK PAIN, CHRONIC  09/04/2006    Past Surgical History:  Procedure Laterality Date   ABDOMINAL HYSTERECTOMY     ANTERIOR CERVICAL DECOMPRESSION/DISCECTOMY FUSION 4 LEVELS N/A 11/11/2019   Procedure: Cervical three-four Cervical four-five Cervical five-six Cervical six-seven  Anterior cervical decompression/discectomy/fusion;  Surgeon: Erline Levine, MD;  Location: [place];  Service: Neurosurgery;  Laterality: N/A;   LEFT HEART CATHETERIZATION WITH CORONARY ANGIOGRAM N/A 11/05/2013   Procedure: LEFT HEART CATHETERIZATION WITH CORONARY ANGIOGRAM;  Surgeon: Troy Sine, MD;  Location: [facility] CATH LAB;  Service: Cardiovascular;  Laterality: N/A;     OB History   No obstetric history on file.     No family history on file.  Social History   Tobacco Use   Smoking status: Never   Smokeless tobacco: Never  Vaping Use   Vaping Use: Never used  Substance Use Topics   Alcohol use: No   Drug use: No    Home Medications Prior to Admission medications   Medication Sig Start Date End Date Taking? Authorizing Provider  acetaminophen (TYLENOL) 325 MG tablet Take 2 tablets (650 mg total) by mouth every 6 (six) hours as needed for up to 30 doses for mild pain or moderate pain. Patient not taking: No sig reported 05/28/20   Wyvonnia Dusky, MD  albuterol (VENTOLIN HFA) 108 (90 Base) MCG/ACT inhaler Inhale 1-2 puffs into the lungs every 6 (six) hours as needed for wheezing or shortness of breath.  04/22/20   Vevelyn Francois, NP  aspirin EC 81 MG tablet Take 81 mg by mouth daily. Swallow whole.    [provider]  Blood Glucose Monitoring Suppl (ONETOUCH VERIO) w/Device KIT Use 1-4 times daily as needed/directed DX E11.9 Patient not taking: No sig reported 04/25/20   Vevelyn Francois, NP  chlorthalidone (HYGROTON) 25 MG tablet Take 1 tablet (25 mg total) by mouth daily. 04/22/20   Vevelyn Francois, NP  cloNIDine (CATAPRES) 0.2 MG tablet Take 1 tablet (0.2 mg total) by mouth 2 (two) times daily. Patient not taking:  Reported on 09/01/2020 12/10/19   Jaynee Eagles, PA-C  clopidogrel (PLAVIX) 75 MG tablet Take 1 tablet (75 mg total) by mouth daily. 04/22/20   Vevelyn Francois, NP  diclofenac Sodium (VOLTAREN) 1 % GEL Apply 2 g topically 4 (four) times daily as needed. 07/27/20   Quintella Reichert, MD  diphenhydramine-acetaminophen (TYLENOL PM) 25-500 MG TABS tablet Take 1 tablet by mouth at bedtime.    [provider]  diphenhydramine-acetaminophen (TYLENOL PM) 25-500 MG TABS tablet Take 1 tablet by mouth at bedtime as needed (sleep and pain).    [provider]  gabapentin (NEURONTIN) 100 MG capsule Take 100 mg by mouth 3 (three) times daily.    [provider]  gabapentin (NEURONTIN) 300 MG capsule Take 1 capsule (300 mg total) by mouth daily for 1 day, THEN 1 capsule (300 mg total) 2 (two) times daily for 1 day, THEN 1 capsule (300 mg total) 3 (three) times daily. Patient not taking: Reported on 09/01/2020 07/28/20 10/28/20  Arnaldo Natal, MD  glipiZIDE (GLUCOTROL) 5 MG tablet Take 1 tablet (5 mg total) by mouth 2 (two) times daily before a meal. 04/22/20 04/22/21  Vevelyn Francois, NP  hydrALAZINE (APRESOLINE) 50 MG tablet Take 1 tablet (50 mg total) by mouth 2 (two) times daily. 04/22/20   Vevelyn Francois, NP  Lancets [facility] ULTRASOFT) lancets Use 1-4 times daily as needed/directed  DX E11.9 Patient not taking: No sig reported 04/25/20   Vevelyn Francois, NP  lidocaine (LIDODERM) 5 % Place 1 patch onto the skin daily. Remove & Discard patch within 12 hours or as directed by MD Patient not taking: No sig reported 04/22/20 04/22/21  Vevelyn Francois, NP  lidocaine (LIDODERM) 5 % Place 1 patch onto the skin daily. Remove & Discard patch within 12 hours or as directed by MD Patient not taking: Reported on 09/01/2020 07/21/20   Palumbo, April, MD  losartan (COZAAR) 50 MG tablet Take 50 mg by mouth daily. 05/07/20   [provider]  metFORMIN (GLUCOPHAGE) 500 MG tablet Take 1.5 tablets (750 mg total) by  mouth 2 (two) times daily with a meal. Patient taking differently: Take 1,000 mg by mouth 2 (two) times daily with a meal. 10/27/19 09/01/20  Veryl Speak, MD  methocarbamol (ROBAXIN) 500 MG tablet TAKE 1 TABLET BY MOUTH 4 TIMES DAILY. Patient not taking: No sig reported 05/09/20   Vevelyn Francois, NP  [facility] VERIO test strip Use 1-4 times daily as directed/needed   DX E11.9 Patient not taking: No sig reported 04/25/20   Vevelyn Francois, NP  oxyCODONE (ROXICODONE) 5 MG immediate release tablet Take 1 tablet (5 mg total) by mouth every 6 (six) hours as needed for up to 4 doses for severe pain. 05/28/20   Wyvonnia Dusky, MD  potassium chloride (KLOR-CON) 10 MEQ tablet Take 1 tablet (10 mEq total) by mouth daily. Patient  not taking: Reported on 09/01/2020 04/25/20   Noemi Chapel, MD  potassium chloride (MICRO-K) 10 MEQ CR capsule Take 10 mEq by mouth daily.    [provider]  potassium chloride SA (KLOR-CON) 20 MEQ tablet Take 1 tablet (20 mEq total) by mouth 2 (two) times daily. Patient not taking: Reported on 09/01/2020 07/13/20   Fredia Sorrow, MD  rosuvastatin (CRESTOR) 10 MG tablet Take 1 tablet (10 mg total) by mouth at bedtime. 04/22/20 04/22/21  Vevelyn Francois, NP    Allergies    Codeine, Ibuprofen, and Imdur [isosorbide nitrate]  Review of Systems   Review of Systems  All other systems reviewed and are negative.  Physical Exam Updated Vital Signs BP 132/68 (BP Location: Left Arm)   Pulse (!) 58   Temp 98.4 F (36.9 C) (Oral)   Resp 16   SpO2 99%   Physical Exam Vitals and nursing note reviewed.  Constitutional:      General: She is not in acute distress.    Appearance: She is well-developed. She is not diaphoretic.  HENT:     Head: Normocephalic and atraumatic.  Neck:     Comments: There is tenderness to palpation in the soft tissues of the lower cervical region.  There is no bony tenderness or step-off.  She has good range of motion. Cardiovascular:     Rate and  Rhythm: Normal rate and regular rhythm.     Heart sounds: No murmur heard.   No friction rub. No gallop.  Pulmonary:     Effort: Pulmonary effort is normal. No respiratory distress.     Breath sounds: Normal breath sounds. No wheezing.  Abdominal:     General: Bowel sounds are normal. There is no distension.     Palpations: Abdomen is soft.     Tenderness: There is no abdominal tenderness.  Musculoskeletal:        General: Normal range of motion.     Cervical back: Normal range of motion and neck supple.  Skin:    General: Skin is warm and dry.  Neurological:     General: No focal deficit present.     Mental Status: She is alert and oriented to person, place, and time.    ED Results / Procedures / Treatments   Labs (all labs ordered are listed, but only abnormal results are displayed) Labs Reviewed - No data to display  EKG None  Radiology No results found.  Procedures Procedures   Medications Ordered in ED Medications  cyclobenzaprine (FLEXERIL) tablet 5 mg (has no administration in time range)  acetaminophen (TYLENOL) tablet 1,000 mg (has no administration in time range)    ED Course  I have reviewed the triage vital signs and the nursing notes.  Pertinent labs & imaging results that were available during my care of the patient were reviewed by me and considered in my medical decision making (see chart for details).    MDM Rules/Calculators/A&P  Patient with history of frequent ER visits presenting with complaints of neck pain.  She describes the pain in the center of the back of her neck that radiates into her head and causes her head to hurt.  Patient seems to have chronic neck issues.  She also frequents the ER with various complaints.  Her x-rays are negative.  Patient to be discharged with tramadol and follow-up with primary doctor if not improving.  Final Clinical Impression(s) / ED Diagnoses Final diagnoses:  None    Rx / DC Orders  ED Discharge  Orders     None        Veryl Speak, MD 09/09/20 [phone]

## 2020-09-09 NOTE — ED Notes (Signed)
Patient transported to X-ray 

## 2020-09-09 NOTE — ED Notes (Signed)
Discharge instructions gone over with patient. Called [employer] for ride back home. Pt in lobby, sort NT aware.

## 2020-09-09 NOTE — Discharge Instructions (Addendum)
Take tramadol as prescribed as needed for pain.  Follow-up with your primary doctor if symptoms are not improving in the next few days.

## 2020-09-12 ENCOUNTER — Emergency Department (HOSPITAL_COMMUNITY)
Admission: EM | Admit: 2020-09-12 | Discharge: 2020-09-13 | Disposition: A | Discharge status: Home / Self Care | Payer: Medicare Other | Attending: Emergency Medicine | Admitting: Emergency Medicine

## 2020-09-12 ENCOUNTER — Other Ambulatory Visit: Payer: Self-pay

## 2020-09-12 ENCOUNTER — Encounter (HOSPITAL_COMMUNITY): Payer: Self-pay | Admitting: Emergency Medicine

## 2020-09-12 DIAGNOSIS — I10 Essential (primary) hypertension: Secondary | ICD-10-CM | POA: Insufficient documentation

## 2020-09-12 DIAGNOSIS — R109 Unspecified abdominal pain: Secondary | ICD-10-CM | POA: Diagnosis not present

## 2020-09-12 DIAGNOSIS — Z7902 Long term (current) use of antithrombotics/antiplatelets: Secondary | ICD-10-CM | POA: Insufficient documentation

## 2020-09-12 DIAGNOSIS — M79605 Pain in left leg: Secondary | ICD-10-CM | POA: Insufficient documentation

## 2020-09-12 DIAGNOSIS — G8929 Other chronic pain: Secondary | ICD-10-CM

## 2020-09-12 DIAGNOSIS — G43709 Chronic migraine without aura, not intractable, without status migrainosus: Secondary | ICD-10-CM | POA: Diagnosis not present

## 2020-09-12 DIAGNOSIS — R82998 Other abnormal findings in urine: Secondary | ICD-10-CM | POA: Diagnosis not present

## 2020-09-12 DIAGNOSIS — G63 Polyneuropathy in diseases classified elsewhere: Secondary | ICD-10-CM

## 2020-09-12 DIAGNOSIS — M79604 Pain in right leg: Secondary | ICD-10-CM | POA: Diagnosis not present

## 2020-09-12 DIAGNOSIS — E119 Type 2 diabetes mellitus without complications: Secondary | ICD-10-CM | POA: Diagnosis not present

## 2020-09-12 DIAGNOSIS — M542 Cervicalgia: Secondary | ICD-10-CM | POA: Insufficient documentation

## 2020-09-12 DIAGNOSIS — M791 Myalgia, unspecified site: Secondary | ICD-10-CM

## 2020-09-12 DIAGNOSIS — Z79899 Other long term (current) drug therapy: Secondary | ICD-10-CM | POA: Insufficient documentation

## 2020-09-12 DIAGNOSIS — Z7984 Long term (current) use of oral hypoglycemic drugs: Secondary | ICD-10-CM | POA: Insufficient documentation

## 2020-09-12 DIAGNOSIS — M62838 Other muscle spasm: Secondary | ICD-10-CM | POA: Diagnosis not present

## 2020-09-12 DIAGNOSIS — Z7982 Long term (current) use of aspirin: Secondary | ICD-10-CM | POA: Insufficient documentation

## 2020-09-12 DIAGNOSIS — R519 Headache, unspecified: Secondary | ICD-10-CM | POA: Insufficient documentation

## 2020-09-12 DIAGNOSIS — Z8616 Personal history of COVID-19: Secondary | ICD-10-CM | POA: Diagnosis not present

## 2020-09-12 DIAGNOSIS — E349 Endocrine disorder, unspecified: Secondary | ICD-10-CM | POA: Diagnosis not present

## 2020-09-12 NOTE — ED Triage Notes (Signed)
Patient reports persistent headache with posterior neck pain for several weeks , denies injury , no fever or neck stiffness.

## 2020-09-12 NOTE — ED Provider Notes (Signed)
Emergency Medicine Provider Triage Evaluation Note  Tyquasia Pant , a 75 y.o. female  was evaluated in triage.  Pt complains of headache ongoing for the last few weeks along with associated neck pain.  Patient reports it started after a fall.  No treatments prior to arrival.  No specific aggravating or alleviating factors.  Review of Systems  Positive: Headache, neck pain Negative: Fever, chills, nausea, vomiting, neck stiffness  Physical Exam  BP 139/64 (BP Location: Left Arm)   Pulse 66   Temp 97.9 F (36.6 C)   Resp 16   SpO2 97%  Gen:   Awake, no distress   Resp:  Normal effort  MSK:   Moves extremities without difficulty ambulates with steady gait Other:  Grossly normal neurologic exam  Medical Decision Making  Medically screening exam initiated at 11:07 PM.  Appropriate orders placed.  Edris Schneck was informed that the remainder of the evaluation will be completed by another provider, this initial triage assessment does not replace that evaluation, and the importance of remaining in the ED until their evaluation is complete.  Ongoing posttraumatic headache   Kassady Laboy, [name] 09/12/20 2308    Gerhard Munch, MD 09/15/20 [phone]

## 2020-09-13 DIAGNOSIS — R519 Headache, unspecified: Secondary | ICD-10-CM | POA: Diagnosis not present

## 2020-09-13 LAB — URINALYSIS, ROUTINE W REFLEX MICROSCOPIC
Bacteria, UA: NONE SEEN
Bilirubin Urine: NEGATIVE
Glucose, UA: NEGATIVE mg/dL
Hgb urine dipstick: NEGATIVE
Ketones, ur: NEGATIVE mg/dL
Nitrite: NEGATIVE
Protein, ur: NEGATIVE mg/dL
Specific Gravity, Urine: 1.01 (ref 1.005–1.030)
pH: 5 (ref 5.0–8.0)

## 2020-09-13 LAB — CBC
HCT: 34.8 % — ABNORMAL LOW (ref 36.0–46.0)
Hemoglobin: 11.3 g/dL — ABNORMAL LOW (ref 12.0–15.0)
MCH: 30.5 pg (ref 26.0–34.0)
MCHC: 32.5 g/dL (ref 30.0–36.0)
MCV: 93.8 fL (ref 80.0–100.0)
Platelets: 279 10*3/uL (ref 150–400)
RBC: 3.71 MIL/uL — ABNORMAL LOW (ref 3.87–5.11)
RDW: 12.2 % (ref 11.5–15.5)
WBC: 8.1 10*3/uL (ref 4.0–10.5)
nRBC: 0 % (ref 0.0–0.2)

## 2020-09-13 LAB — COMPREHENSIVE METABOLIC PANEL
ALT: 12 U/L (ref 0–44)
AST: 20 U/L (ref 15–41)
Albumin: 3.7 g/dL (ref 3.5–5.0)
Alkaline Phosphatase: 92 U/L (ref 38–126)
Anion gap: 9 (ref 5–15)
BUN: 20 mg/dL (ref 8–23)
CO2: 28 mmol/L (ref 22–32)
Calcium: 9.1 mg/dL (ref 8.9–10.3)
Chloride: 98 mmol/L (ref 98–111)
Creatinine, Ser: 1.26 mg/dL — ABNORMAL HIGH (ref 0.44–1.00)
GFR, Estimated: 45 mL/min — ABNORMAL LOW (ref >60)
Glucose, Bld: 124 mg/dL — ABNORMAL HIGH (ref 70–99)
Potassium: 3.2 mmol/L — ABNORMAL LOW (ref 3.5–5.1)
Sodium: 135 mmol/L (ref 135–145)
Total Bilirubin: 0.5 mg/dL (ref 0.3–1.2)
Total Protein: 7.7 g/dL (ref 6.5–8.1)

## 2020-09-13 MED ORDER — OXYCODONE-ACETAMINOPHEN 5-325 MG PO TABS
1.0000 | ORAL_TABLET | Freq: Once | ORAL | Status: AC
  Administered 2020-09-13: 1 via ORAL
  Filled 2020-09-13: qty 1

## 2020-09-13 NOTE — ED Notes (Signed)
Pt assisted to restroom. Unable to collect UA, pt had BM in hat. Will attempt to collect another sample.

## 2020-09-13 NOTE — ED Provider Notes (Signed)
[facility] EMERGENCY DEPARTMENT Provider Note   CSN: [id number] Arrival date & time: 09/12/20  2151     History Chief Complaint  Patient presents with   Headache / Neck Pain     [name] is a 75 y.o. female.  HPI Patient complaining of pain from multiple sources that she says is chronic and not improved. She says there is no particular reason why she came in for the pain today specifically, just that she hurts and nobody has given her any relief for it. She is endorsing headache and neck pain since a fall several weeks ago. Nothing makes it any better or worse. She also has pain in her legs but denies any familiarity with diabetic neuropathy. Has her legs wrapped in ACE bandages as she was reportedly instructed to do by another provider for the pain. Says that she has been given tennis shoes for the pain but these don't help. The pain in her legs is a constant throbbing pain that doesn't come and go but is always there. Her entire legs hurt all the way up to her hips.  Since yesterday she has been having increased dark urine that she says has never been so yellow in her life. She says that her flank/hip area hurts as well. Does not think she has been drinking a lot of water lately. Does not remember if she has any burning with urination.      Past Medical History:  Diagnosis Date   Diabetes mellitus without complication ([place])    GERD 09/04/2006   Qualifier: Diagnosis of  By: Suzie Portela     History of echocardiogram    a. 2D ECHO: 11/06/2013 EF 65%; no WMA. Mild TR. 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   Numbness 06/20/2012   Diabetes mellitus ([place]) 06/20/2012   CVA (cerebral infarction) 06/20/2012   Hypokalemia 06/20/2012   ARTHRALGIA 01/28/2009   CHEST PAIN UNSPECIFIED 11/26/2008   DEPRESSION 09/04/2006   Essential hypertension 09/04/2006   GERD 09/04/2006   MULTIPLE SITES, ARTHRITIS-ACUTE/CHRONIC 09/04/2006   LOW BACK PAIN, CHRONIC 09/04/2006    Past Surgical History:  Procedure Laterality Date   ABDOMINAL HYSTERECTOMY     ANTERIOR CERVICAL DECOMPRESSION/DISCECTOMY FUSION 4 LEVELS N/A 11/11/2019   Procedure: Cervical three-four Cervical four-five Cervical five-six Cervical six-seven  Anterior cervical decompression/discectomy/fusion;  Surgeon: Erline Levine, MD;  Location: [place];  Service: Neurosurgery;  Laterality: N/A;   LEFT HEART CATHETERIZATION WITH CORONARY ANGIOGRAM N/A 11/05/2013   Procedure: LEFT HEART CATHETERIZATION WITH CORONARY ANGIOGRAM;  Surgeon: Troy Sine, MD;  Location: [facility] CATH LAB;  Service: Cardiovascular;  Laterality: N/A;     OB History   No obstetric history on file.     No family history on file.  Social History   Tobacco Use   Smoking status: Never   Smokeless tobacco: Never  Vaping Use   Vaping Use: Never used  Substance Use Topics   Alcohol use: No   Drug use: No    Home Medications Prior to Admission medications  Medication Sig Start Date End Date Taking? Authorizing Provider  acetaminophen (TYLENOL) 325 MG tablet Take 2 tablets (650 mg total) by mouth every 6 (six) hours as needed for up to 30 doses for mild pain or moderate pain. Patient not taking: No sig reported 05/28/20   Wyvonnia Dusky, MD  albuterol (VENTOLIN HFA) 108 (90 Base) MCG/ACT inhaler Inhale 1-2 puffs into the lungs every 6 (six) hours as needed for wheezing or shortness of breath. 04/22/20   Vevelyn Francois, NP  aspirin EC 81 MG tablet Take 81 mg by mouth daily. Swallow whole.     [provider]  Blood Glucose Monitoring Suppl (ONETOUCH VERIO) w/Device KIT Use 1-4 times daily as needed/directed DX E11.9 Patient not taking: No sig reported 04/25/20   Vevelyn Francois, NP  chlorthalidone (HYGROTON) 25 MG tablet Take 1 tablet (25 mg total) by mouth daily. 04/22/20   Vevelyn Francois, NP  cloNIDine (CATAPRES) 0.2 MG tablet Take 1 tablet (0.2 mg total) by mouth 2 (two) times daily. Patient not taking: Reported on 09/01/2020 12/10/19   Jaynee Eagles, PA-C  clopidogrel (PLAVIX) 75 MG tablet Take 1 tablet (75 mg total) by mouth daily. 04/22/20   Vevelyn Francois, NP  diclofenac Sodium (VOLTAREN) 1 % GEL Apply 2 g topically 4 (four) times daily as needed. 07/27/20   Quintella Reichert, MD  diphenhydramine-acetaminophen (TYLENOL PM) 25-500 MG TABS tablet Take 1 tablet by mouth at bedtime.    [provider]  diphenhydramine-acetaminophen (TYLENOL PM) 25-500 MG TABS tablet Take 1 tablet by mouth at bedtime as needed (sleep and pain).    [provider]  gabapentin (NEURONTIN) 100 MG capsule Take 100 mg by mouth 3 (three) times daily.    [provider]  gabapentin (NEURONTIN) 300 MG capsule Take 1 capsule (300 mg total) by mouth daily for 1 day, THEN 1 capsule (300 mg total) 2 (two) times daily for 1 day, THEN 1 capsule (300 mg total) 3 (three) times daily. Patient not taking: Reported on 09/01/2020 07/28/20 10/28/20  Arnaldo Natal, MD  glipiZIDE (GLUCOTROL) 5 MG tablet Take 1 tablet (5 mg total) by mouth 2 (two) times daily before a meal. 04/22/20 04/22/21  Vevelyn Francois, NP  hydrALAZINE (APRESOLINE) 50 MG tablet Take 1 tablet (50 mg total) by mouth 2 (two) times daily. 04/22/20   Vevelyn Francois, NP  Lancets [facility] ULTRASOFT) lancets Use 1-4 times daily as needed/directed  DX E11.9 Patient not taking: No sig reported 04/25/20   Vevelyn Francois, NP  lidocaine (LIDODERM) 5 % Place 1 patch onto the skin daily. Remove & Discard patch within 12 hours or as directed by  MD Patient not taking: No sig reported 04/22/20 04/22/21  Vevelyn Francois, NP  lidocaine (LIDODERM) 5 % Place 1 patch onto the skin daily. Remove & Discard patch within 12 hours or as directed by MD Patient not taking: Reported on 09/01/2020 07/21/20   Palumbo, April, MD  losartan (COZAAR) 50 MG tablet Take 50 mg by mouth daily. 05/07/20   [provider]  metFORMIN (GLUCOPHAGE) 500 MG tablet Take 1.5 tablets (750 mg total) by mouth 2 (two) times daily with a meal. Patient taking differently: Take 1,000 mg by mouth 2 (two) times daily with a meal. 10/27/19 09/01/20  Veryl Speak, MD  methocarbamol (ROBAXIN) 500 MG tablet TAKE 1 TABLET BY MOUTH 4 TIMES DAILY. Patient not taking: No sig reported 05/09/20   Vevelyn Francois, NP  [facility]  VERIO test strip Use 1-4 times daily as directed/needed   DX E11.9 Patient not taking: No sig reported 04/25/20   Vevelyn Francois, NP  oxyCODONE (ROXICODONE) 5 MG immediate release tablet Take 1 tablet (5 mg total) by mouth every 6 (six) hours as needed for up to 4 doses for severe pain. 05/28/20   Wyvonnia Dusky, MD  potassium chloride (KLOR-CON) 10 MEQ tablet Take 1 tablet (10 mEq total) by mouth daily. Patient not taking: Reported on 09/01/2020 04/25/20   Noemi Chapel, MD  potassium chloride (MICRO-K) 10 MEQ CR capsule Take 10 mEq by mouth daily.    [provider]  potassium chloride SA (KLOR-CON) 20 MEQ tablet Take 1 tablet (20 mEq total) by mouth 2 (two) times daily. Patient not taking: Reported on 09/01/2020 07/13/20   Fredia Sorrow, MD  rosuvastatin (CRESTOR) 10 MG tablet Take 1 tablet (10 mg total) by mouth at bedtime. 04/22/20 04/22/21  Vevelyn Francois, NP  traMADol (ULTRAM) 50 MG tablet Take 1 tablet (50 mg total) by mouth every 6 (six) hours as needed. 09/09/20   Veryl Speak, MD    Allergies    Codeine, Ibuprofen, and Imdur [isosorbide nitrate]  Review of Systems   Review of Systems  Genitourinary:  Positive for flank pain.  Musculoskeletal:   Positive for myalgias and neck pain.  Neurological:  Positive for headaches.   Physical Exam Updated Vital Signs BP (!) 175/57   Pulse (!) 53   Temp 97.8 F (36.6 C)   Resp 16   SpO2 100%   Physical Exam Constitutional:      Appearance: Normal appearance.  HENT:     Head: Normocephalic and atraumatic.  Cardiovascular:     Rate and Rhythm: Normal rate.  Pulmonary:     Effort: Pulmonary effort is normal.  Abdominal:     General: There is no distension.     Palpations: Abdomen is soft.     Tenderness: There is abdominal tenderness.  Musculoskeletal:        General: No swelling.     Cervical back: No rigidity or tenderness.     Right lower leg: No tenderness. No edema.     Left lower leg: No tenderness. No edema.  Neurological:     General: No focal deficit present.     Mental Status: She is alert.    ED Results / Procedures / Treatments   Labs (all labs ordered are listed, but only abnormal results are displayed) Labs Reviewed  URINALYSIS, ROUTINE W REFLEX MICROSCOPIC  CBC  COMPREHENSIVE METABOLIC PANEL    EKG None  Radiology No results found.  Procedures Procedures   Medications Ordered in ED Medications  oxyCODONE-acetaminophen (PERCOCET/ROXICET) 5-325 MG per tablet 1 tablet (has no administration in time range)    ED Course  I have reviewed the triage vital signs and the nursing notes.  Pertinent labs & imaging results that were available during my care of the patient were reviewed by me and considered in my medical decision making (see chart for details).    MDM Rules/Calculators/A&P                           Pain on presentation similar to patients prior presentations of chronic pain. No reg flag symptoms, no focal tenderness, no new onset urinary or bowel incontinence. No concern for fracture or acute process. Recommend follow up with PCP.  Patient complaining of dark urine, suprapubic tenderness on  exam. Urinalysis collected but not yet  resulted. Pending results patient will be stable for discharge with possible course of antibiotics if warranted. Sign out given to Dr. Rogene Houston.  Final Clinical Impression(s) / ED Diagnoses Final diagnoses:  None    Rx / DC Orders ED Discharge Orders     None        Scarlett Presto, MD 09/13/20 1537    Lucrezia Starch, MD 09/14/20 [phone]

## 2020-09-13 NOTE — Discharge Instructions (Addendum)
You were seen at [facility] Emergency Room for neck and leg pain. We checked your blood work and your electrolytes were all normal and you were given medication for your pain. 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 Your neck pain is also likely a muscle spasm. We recommend that you schedule a follow up appointment within a week or so with your primary care doctor to discuss long term pain management and to help you get more relief. If you start to experience new weakness, difficulty walking, or new onset slurred speech or blurry vision we recommend that you return to the emergency room.

## 2020-09-13 NOTE — Care Management (Signed)
This patient has had  29 ED visits in the past 6 months 0 has resulted in a hospitalization.  This RN Care Manager has spoken with this patient and reviewed access to appropriate levels of care and set up PCP appointment.  I have [facility] out [facility] for [facility]  with care coordination, but despite, discussing the benefits of PCP patient returns to the ED.  Although patient is not a [facility] patient I will reach out to [facility] for some guidance with assisting this patient.

## 2020-09-17 ENCOUNTER — Ambulatory Visit: Payer: Medicare Other

## 2020-09-19 ENCOUNTER — Other Ambulatory Visit: Payer: Self-pay

## 2020-09-19 ENCOUNTER — Emergency Department (HOSPITAL_COMMUNITY)
Admission: EM | Admit: 2020-09-19 | Discharge: 2020-09-20 | Disposition: A | Discharge status: Home / Self Care | Payer: Medicare Other | Attending: Student | Admitting: Student

## 2020-09-19 ENCOUNTER — Encounter (HOSPITAL_COMMUNITY): Payer: Self-pay | Admitting: Emergency Medicine

## 2020-09-19 DIAGNOSIS — Z7982 Long term (current) use of aspirin: Secondary | ICD-10-CM | POA: Diagnosis not present

## 2020-09-19 DIAGNOSIS — Z8616 Personal history of COVID-19: Secondary | ICD-10-CM | POA: Insufficient documentation

## 2020-09-19 DIAGNOSIS — I10 Essential (primary) hypertension: Secondary | ICD-10-CM | POA: Insufficient documentation

## 2020-09-19 DIAGNOSIS — Z79899 Other long term (current) drug therapy: Secondary | ICD-10-CM | POA: Diagnosis not present

## 2020-09-19 DIAGNOSIS — E119 Type 2 diabetes mellitus without complications: Secondary | ICD-10-CM | POA: Diagnosis not present

## 2020-09-19 DIAGNOSIS — M542 Cervicalgia: Secondary | ICD-10-CM | POA: Diagnosis not present

## 2020-09-19 DIAGNOSIS — M5412 Radiculopathy, cervical region: Secondary | ICD-10-CM | POA: Diagnosis not present

## 2020-09-19 DIAGNOSIS — Z7984 Long term (current) use of oral hypoglycemic drugs: Secondary | ICD-10-CM | POA: Diagnosis not present

## 2020-09-19 DIAGNOSIS — R519 Headache, unspecified: Secondary | ICD-10-CM | POA: Diagnosis not present

## 2020-09-19 DIAGNOSIS — Z7902 Long term (current) use of antithrombotics/antiplatelets: Secondary | ICD-10-CM | POA: Insufficient documentation

## 2020-09-19 MED ORDER — GABAPENTIN 300 MG PO CAPS: 300.0000 mg | ORAL_CAPSULE | Freq: Three times a day (TID) | ORAL | 0 refills | Status: DC

## 2020-09-19 MED ORDER — GABAPENTIN 300 MG PO CAPS
300.0000 mg | ORAL_CAPSULE | Freq: Once | ORAL | Status: AC
  Administered 2020-09-19: 300 mg via ORAL
  Filled 2020-09-19: qty 1

## 2020-09-19 NOTE — ED Provider Notes (Signed)
Emergency Medicine Provider Triage Evaluation Note  [name] , a 75 y.o. female  was evaluated in triage.  Pt complains of a fall in [employer] "at some point this year." Head and neck pain persists. cervical spine radiograph taken 10 days ago.  Review of Systems  Positive: Headache and neck pain Negative: CP, SOB  Physical Exam  There were no vitals taken for this visit. Gen:   Awake, no distress   Resp:  Normal effort  MSK:   Moves extremities without difficulty  Other:  No signs of head injury. Normal rom  Medical Decision Making  Medically screening exam initiated at 4:24 PM.  Appropriate orders placed.  [name] was informed that the remainder of the evaluation will be completed by another provider, this initial triage assessment does not replace that evaluation, and the importance of remaining in the ED until their evaluation is complete.     [name] 09/19/20 1627    Gloris Manchester, MD 09/21/20 2352

## 2020-09-19 NOTE — ED Triage Notes (Signed)
Pt here for persistent headache and neck pain. A&Ox4. NAD.

## 2020-09-20 NOTE — ED Notes (Signed)
Taxi called for patient.

## 2020-09-27 ENCOUNTER — Emergency Department (HOSPITAL_COMMUNITY)
Admission: EM | Admit: 2020-09-27 | Discharge: 2020-09-28 | Disposition: A | Discharge status: Home / Self Care | Payer: Medicare Other | Attending: Emergency Medicine | Admitting: Emergency Medicine

## 2020-09-27 ENCOUNTER — Other Ambulatory Visit: Payer: Self-pay

## 2020-09-27 DIAGNOSIS — E119 Type 2 diabetes mellitus without complications: Secondary | ICD-10-CM | POA: Diagnosis not present

## 2020-09-27 DIAGNOSIS — Z8616 Personal history of COVID-19: Secondary | ICD-10-CM | POA: Insufficient documentation

## 2020-09-27 DIAGNOSIS — Z955 Presence of coronary angioplasty implant and graft: Secondary | ICD-10-CM | POA: Diagnosis not present

## 2020-09-27 DIAGNOSIS — Z7982 Long term (current) use of aspirin: Secondary | ICD-10-CM | POA: Insufficient documentation

## 2020-09-27 DIAGNOSIS — I10 Essential (primary) hypertension: Secondary | ICD-10-CM | POA: Insufficient documentation

## 2020-09-27 DIAGNOSIS — I160 Hypertensive urgency: Secondary | ICD-10-CM | POA: Diagnosis not present

## 2020-09-27 DIAGNOSIS — Z79899 Other long term (current) drug therapy: Secondary | ICD-10-CM | POA: Diagnosis not present

## 2020-09-27 DIAGNOSIS — R519 Headache, unspecified: Secondary | ICD-10-CM | POA: Diagnosis present

## 2020-09-27 DIAGNOSIS — Z7984 Long term (current) use of oral hypoglycemic drugs: Secondary | ICD-10-CM | POA: Insufficient documentation

## 2020-09-27 NOTE — ED Triage Notes (Signed)
Pt c/o headache and high blood pressure. Pt states she is out of one of her blood pressure pills but does not know the name of it.

## 2020-09-27 NOTE — ED Provider Notes (Signed)
Emergency Medicine Provider Triage Evaluation Note  [name] , a 75 y.o. female  was evaluated in triage.  Pt complains of hypertension and headache.  She reports that she is out of one of her blood pressure medications however does not know the name of it.  Has not taken this medication in 1 to 2 days.  Patient reports compliance with all of her medications.  Patient reports that she has headache to the crown of her head.  Reports that she has had headaches intermittently over the last week.  Describes pain as a pressure.  Denies any recent falls or injuries.  Review of Systems  Positive: Headache, hypertension Negative: Chest pain, shortness of breath, visual disturbance, numbness, weakness  Physical Exam  BP (!) 213/77 (BP Location: Right Arm)   Pulse 68   Temp 98.3 F (36.8 C) (Oral)   Resp 16   Ht 5\' 5"  (1.651 m)   Wt 67 kg   SpO2 100%   BMI 24.58 kg/m  Gen:   Awake, no distress   Resp:  Normal effort  MSK:   Moves extremities without difficulty  Other:  CN II through XII intact.  +5 Strength to bilateral upper and lower extremities.  Medical Decision Making  Medically screening exam initiated at 9:55 PM.  Appropriate orders placed.  [name] was informed that the remainder of the evaluation will be completed by another provider, this initial triage assessment does not replace that evaluation, and the importance of remaining in the ED until their evaluation is complete.  The patient appears stable so that the remainder of the work up may be completed by another provider.      [name] 09/27/20 2156    2157, MD 09/27/20 2348

## 2020-09-27 NOTE — ED Notes (Signed)
Pt refused all blood work, PA made aware and pt sent back out to the lobby.

## 2020-09-28 DIAGNOSIS — I160 Hypertensive urgency: Secondary | ICD-10-CM | POA: Diagnosis not present

## 2020-09-28 LAB — COMPREHENSIVE METABOLIC PANEL
ALT: 11 U/L (ref 0–44)
AST: 18 U/L (ref 15–41)
Albumin: 3.4 g/dL — ABNORMAL LOW (ref 3.5–5.0)
Alkaline Phosphatase: 83 U/L (ref 38–126)
Anion gap: 9 (ref 5–15)
BUN: 12 mg/dL (ref 8–23)
CO2: 26 mmol/L (ref 22–32)
Calcium: 8.7 mg/dL — ABNORMAL LOW (ref 8.9–10.3)
Chloride: 104 mmol/L (ref 98–111)
Creatinine, Ser: 0.97 mg/dL (ref 0.44–1.00)
GFR, Estimated: >60 mL/min (ref >60)
Glucose, Bld: 99 mg/dL (ref 70–99)
Potassium: 3.6 mmol/L (ref 3.5–5.1)
Sodium: 139 mmol/L (ref 135–145)
Total Bilirubin: 0.6 mg/dL (ref 0.3–1.2)
Total Protein: 7.3 g/dL (ref 6.5–8.1)

## 2020-09-28 LAB — CBC WITH DIFFERENTIAL/PLATELET
Abs Immature Granulocytes: 0.03 10*3/uL (ref 0.00–0.07)
Basophils Absolute: 0 10*3/uL (ref 0.0–0.1)
Basophils Relative: 1 %
Eosinophils Absolute: 0.1 10*3/uL (ref 0.0–0.5)
Eosinophils Relative: 1 %
HCT: 34.3 % — ABNORMAL LOW (ref 36.0–46.0)
Hemoglobin: 10.9 g/dL — ABNORMAL LOW (ref 12.0–15.0)
Immature Granulocytes: 0 %
Lymphocytes Relative: 13 %
Lymphs Abs: 1.1 10*3/uL (ref 0.7–4.0)
MCH: 30.2 pg (ref 26.0–34.0)
MCHC: 31.8 g/dL (ref 30.0–36.0)
MCV: 95 fL (ref 80.0–100.0)
Monocytes Absolute: 0.7 10*3/uL (ref 0.1–1.0)
Monocytes Relative: 8 %
Neutro Abs: 6.8 10*3/uL (ref 1.7–7.7)
Neutrophils Relative %: 77 %
Platelets: 249 10*3/uL (ref 150–400)
RBC: 3.61 MIL/uL — ABNORMAL LOW (ref 3.87–5.11)
RDW: 12.4 % (ref 11.5–15.5)
WBC: 8.7 10*3/uL (ref 4.0–10.5)
nRBC: 0 % (ref 0.0–0.2)

## 2020-09-28 MED ORDER — CHLORTHALIDONE 25 MG PO TABS
25.0000 mg | ORAL_TABLET | Freq: Once | ORAL | Status: AC
  Administered 2020-09-28: 25 mg via ORAL
  Filled 2020-09-28: qty 1

## 2020-09-28 MED ORDER — CLONIDINE HCL 0.2 MG PO TABS: 0.2000 mg | ORAL_TABLET | Freq: Once | ORAL | Status: DC

## 2020-09-28 NOTE — ED Provider Notes (Signed)
[facility] EMERGENCY DEPARTMENT Provider Note   CSN: [id number] Arrival date & time: 09/27/20  2044     History Chief Complaint  Patient presents with   Blood Pressure Check   Headache    [name] is a 75 y.o. female.  HPI Patient presents with concern of headache, leg pain bilaterally.  Patient is a tangential historian, but it seems as though she has had worsening headache over possibly 2 weeks, and bilateral leg pain of about the same duration.  She has not seen her physician, has not adjusted medication, but seems to be taking this regularly aside from when she may have run out of. She denies fever, fall, vomiting, weakness.  No clear exacerbating relieving factors.  Chart review notable for 31 prior visits over the past 6 months with somewhat similar presentations.    Past Medical History:  Diagnosis Date   Diabetes mellitus without complication ([place])    GERD 09/04/2006   Qualifier: Diagnosis of  By: Suzie Portela     History of echocardiogram    a. 2D ECHO: 11/06/2013 EF 65%; no WMA. Mild TR. 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   Numbness 06/20/2012   Diabetes mellitus ([place]) 06/20/2012   CVA (cerebral infarction) 06/20/2012   Hypokalemia 06/20/2012   ARTHRALGIA 01/28/2009   CHEST PAIN UNSPECIFIED 11/26/2008   DEPRESSION 09/04/2006   Essential hypertension 09/04/2006   GERD 09/04/2006   MULTIPLE SITES, ARTHRITIS-ACUTE/CHRONIC 09/04/2006   LOW BACK PAIN, CHRONIC 09/04/2006    Past Surgical History:  Procedure  Laterality Date   ABDOMINAL HYSTERECTOMY     ANTERIOR CERVICAL DECOMPRESSION/DISCECTOMY FUSION 4 LEVELS N/A 11/11/2019   Procedure: Cervical three-four Cervical four-five Cervical five-six Cervical six-seven  Anterior cervical decompression/discectomy/fusion;  Surgeon: Erline Levine, MD;  Location: [place];  Service: Neurosurgery;  Laterality: N/A;   LEFT HEART CATHETERIZATION WITH CORONARY ANGIOGRAM N/A 11/05/2013   Procedure: LEFT HEART CATHETERIZATION WITH CORONARY ANGIOGRAM;  Surgeon: Troy Sine, MD;  Location: [facility] CATH LAB;  Service: Cardiovascular;  Laterality: N/A;     OB History   No obstetric history on file.     No family history on file.  Social History   Tobacco Use   Smoking status: Never   Smokeless tobacco: Never  Vaping Use   Vaping Use: Never used  Substance Use Topics   Alcohol use: No   Drug use: No    Home Medications Prior to Admission medications   Medication Sig Start Date End Date Taking? Authorizing Provider  acetaminophen (TYLENOL) 325 MG tablet Take 2 tablets (650 mg total) by mouth every 6 (six) hours as needed for up to 30 doses for mild pain or moderate pain. Patient not taking: No sig reported 05/28/20   Wyvonnia Dusky, MD  albuterol (VENTOLIN HFA) 108 (90 Base) MCG/ACT inhaler Inhale 1-2 puffs into the lungs every 6 (six) hours as needed for wheezing or shortness of breath. 04/22/20   Vevelyn Francois, NP  aspirin EC 81 MG tablet Take 81 mg by mouth daily. Swallow  whole.    [provider]  Blood Glucose Monitoring Suppl (ONETOUCH VERIO) w/Device KIT Use 1-4 times daily as needed/directed DX E11.9 Patient not taking: No sig reported 04/25/20   Vevelyn Francois, NP  chlorthalidone (HYGROTON) 25 MG tablet Take 1 tablet (25 mg total) by mouth daily. 04/22/20   Vevelyn Francois, NP  cloNIDine (CATAPRES) 0.2 MG tablet Take 1 tablet (0.2 mg total) by mouth 2 (two) times daily. Patient not taking: Reported on 09/01/2020 12/10/19   Jaynee Eagles, PA-C   clopidogrel (PLAVIX) 75 MG tablet Take 1 tablet (75 mg total) by mouth daily. 04/22/20   Vevelyn Francois, NP  diclofenac Sodium (VOLTAREN) 1 % GEL Apply 2 g topically 4 (four) times daily as needed. 07/27/20   Quintella Reichert, MD  diphenhydramine-acetaminophen (TYLENOL PM) 25-500 MG TABS tablet Take 1 tablet by mouth at bedtime.    [provider]  diphenhydramine-acetaminophen (TYLENOL PM) 25-500 MG TABS tablet Take 1 tablet by mouth at bedtime as needed (sleep and pain).    [provider]  gabapentin (NEURONTIN) 300 MG capsule Take 1 capsule (300 mg total) by mouth 3 (three) times daily. 09/19/20   Kommor, [place], MD  glipiZIDE (GLUCOTROL) 5 MG tablet Take 1 tablet (5 mg total) by mouth 2 (two) times daily before a meal. 04/22/20 04/22/21  Vevelyn Francois, NP  hydrALAZINE (APRESOLINE) 50 MG tablet Take 1 tablet (50 mg total) by mouth 2 (two) times daily. 04/22/20   Vevelyn Francois, NP  Lancets [facility] ULTRASOFT) lancets Use 1-4 times daily as needed/directed  DX E11.9 Patient not taking: No sig reported 04/25/20   Vevelyn Francois, NP  lidocaine (LIDODERM) 5 % Place 1 patch onto the skin daily. Remove & Discard patch within 12 hours or as directed by MD Patient not taking: No sig reported 04/22/20 04/22/21  Vevelyn Francois, NP  lidocaine (LIDODERM) 5 % Place 1 patch onto the skin daily. Remove & Discard patch within 12 hours or as directed by MD Patient not taking: Reported on 09/01/2020 07/21/20   Palumbo, April, MD  losartan (COZAAR) 50 MG tablet Take 50 mg by mouth daily. 05/07/20   [provider]  metFORMIN (GLUCOPHAGE) 500 MG tablet Take 1.5 tablets (750 mg total) by mouth 2 (two) times daily with a meal. Patient taking differently: Take 1,000 mg by mouth 2 (two) times daily with a meal. 10/27/19 09/01/20  Veryl Speak, MD  methocarbamol (ROBAXIN) 500 MG tablet TAKE 1 TABLET BY MOUTH 4 TIMES DAILY. Patient not taking: No sig reported 05/09/20   Vevelyn Francois, NP  [facility] VERIO  test strip Use 1-4 times daily as directed/needed   DX E11.9 Patient not taking: No sig reported 04/25/20   Vevelyn Francois, NP  oxyCODONE (ROXICODONE) 5 MG immediate release tablet Take 1 tablet (5 mg total) by mouth every 6 (six) hours as needed for up to 4 doses for severe pain. 05/28/20   Wyvonnia Dusky, MD  potassium chloride (KLOR-CON) 10 MEQ tablet Take 1 tablet (10 mEq total) by mouth daily. Patient not taking: Reported on 09/01/2020 04/25/20   Noemi Chapel, MD  potassium chloride (MICRO-K) 10 MEQ CR capsule Take 10 mEq by mouth daily.    [provider]  potassium chloride SA (KLOR-CON) 20 MEQ tablet Take 1 tablet (20 mEq total) by mouth 2 (two) times daily. Patient not taking: Reported on 09/01/2020 07/13/20   Fredia Sorrow, MD  rosuvastatin (CRESTOR) 10 MG tablet Take 1  tablet (10 mg total) by mouth at bedtime. 04/22/20 04/22/21  Vevelyn Francois, NP  traMADol (ULTRAM) 50 MG tablet Take 1 tablet (50 mg total) by mouth every 6 (six) hours as needed. 09/09/20   Veryl Speak, MD    Allergies    Codeine, Ibuprofen, and Imdur [isosorbide nitrate]  Review of Systems   Review of Systems  Constitutional:        Per HPI, otherwise negative  HENT:         Per HPI, otherwise negative  Respiratory:         Per HPI, otherwise negative  Cardiovascular:        Per HPI, otherwise negative  Gastrointestinal:  Negative for vomiting.  Endocrine:       Negative aside from HPI  Genitourinary:        Neg aside from HPI   Musculoskeletal:        Per HPI, otherwise negative  Skin: Negative.   Neurological:  Positive for headaches. Negative for syncope.   Physical Exam Updated Vital Signs BP (!) 207/84   Pulse 67   Temp 98.1 F (36.7 C) (Oral)   Resp 18   Ht $R'5\' 5"'Pt$  (1.651 m)   Wt 67 kg   SpO2 100%   BMI 24.58 kg/m   Physical Exam Vitals and nursing note reviewed.  Constitutional:      General: She is not in acute distress.    Appearance: She is well-developed.  HENT:      Head: Normocephalic and atraumatic.  Eyes:     Conjunctiva/sclera: Conjunctivae normal.  Cardiovascular:     Rate and Rhythm: Normal rate and regular rhythm.  Pulmonary:     Effort: Pulmonary effort is normal. No respiratory distress.     Breath sounds: Normal breath sounds. No stridor.  Abdominal:     General: There is no distension.  Skin:    General: Skin is warm and dry.  Neurological:     Mental Status: She is alert and oriented to person, place, and time.     Cranial Nerves: No cranial nerve deficit.     Motor: Atrophy present.     Coordination: Coordination normal.  Psychiatric:     Comments: Not insightful about her current presentation, nor her ongoing issues with blood pressure management.    ED Results / Procedures / Treatments   Labs (all labs ordered are listed, but only abnormal results are displayed) Labs Reviewed  COMPREHENSIVE METABOLIC PANEL - Abnormal; Notable for the following components:      Result Value   Calcium 8.7 (*)    Albumin 3.4 (*)    All other components within normal limits  CBC WITH DIFFERENTIAL/PLATELET - Abnormal; Notable for the following components:   RBC 3.61 (*)    Hemoglobin 10.9 (*)    HCT 34.3 (*)    All other components within normal limits    EKG None  Radiology No results found.  Procedures Procedures   Medications Ordered in ED Medications  chlorthalidone (HYGROTON) tablet 25 mg (has no administration in time range)    ED Course  I have reviewed the triage vital signs and the nursing notes.  Pertinent labs & imaging results that were available during my care of the patient were reviewed by me and considered in my medical decision making (see chart for details).  Adult female presents with concern for ongoing headache, lower extremity edema.  Here she is awake, alert, mildly hypertensive, but with no hypoxia, room  air saturation 100%, no observed arrhythmia, sinus rhythm, rate 60s.  She does have mild hypertension,  but has not been taking her medication regularly.  She was provided a dose of her chlorthalidone, no evidence for new endorgan deficits, no distress.  Patient encouraged to follow-up with primary care, discharged in stable condition. Final Clinical Impression(s) / ED Diagnoses Final diagnoses:  Hypertensive urgency    Rx / DC Orders ED Discharge Orders     None        Carmin Muskrat, MD 09/28/20 1046

## 2020-09-28 NOTE — Discharge Instructions (Addendum)
As discussed, today's evaluation has been generally reassuring.  It is very important you follow-up with your physician and discuss your medication regimen as well as ensure that you have access to refills.  Return here for concerning changes in your condition.

## 2020-09-29 NOTE — ED Provider Notes (Signed)
[facility] EMERGENCY DEPARTMENT Provider Note   CSN: [id number] Arrival date & time: 09/19/20  1615     History Chief Complaint  Patient presents with   Neck Pain   Headache    Erlene Devita is a 75 y.o. female who presents emergency department for evaluation of persistent headache and neck pain as well as lower extremity pain.  Patient has a known history of cervical radiculopathy and follows with neurosurgery. HA gradual in onset, no neuro deficits, no numbness/tingling/weakness. Pt requesting wraps for her lower extremities .    Neck Pain Associated symptoms: headaches   Associated symptoms: no chest pain and no fever   Headache Associated symptoms: neck pain   Associated symptoms: no abdominal pain, no back pain, no cough, no ear pain, no eye pain, no fever, no seizures, no sore throat and no vomiting       Past Medical History:  Diagnosis Date   Diabetes mellitus without complication ([place])    GERD 09/04/2006   Qualifier: Diagnosis of  By: Suzie Portela     History of echocardiogram    a. 2D ECHO: 11/06/2013 EF 65%; no WMA. Mild TR. 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   Numbness 06/20/2012   Diabetes mellitus ([place]) 06/20/2012   CVA (cerebral infarction) 06/20/2012   Hypokalemia 06/20/2012   ARTHRALGIA 01/28/2009   CHEST PAIN UNSPECIFIED 11/26/2008   DEPRESSION 09/04/2006   Essential hypertension 09/04/2006   GERD 09/04/2006   MULTIPLE SITES, ARTHRITIS-ACUTE/CHRONIC 09/04/2006   LOW BACK PAIN, CHRONIC 09/04/2006     Past Surgical History:  Procedure Laterality Date   ABDOMINAL HYSTERECTOMY     ANTERIOR CERVICAL DECOMPRESSION/DISCECTOMY FUSION 4 LEVELS N/A 11/11/2019   Procedure: Cervical three-four Cervical four-five Cervical five-six Cervical six-seven  Anterior cervical decompression/discectomy/fusion;  Surgeon: Erline Levine, MD;  Location: [place];  Service: Neurosurgery;  Laterality: N/A;   LEFT HEART CATHETERIZATION WITH CORONARY ANGIOGRAM N/A 11/05/2013   Procedure: LEFT HEART CATHETERIZATION WITH CORONARY ANGIOGRAM;  Surgeon: Troy Sine, MD;  Location[facility] CATH LAB;  Service: Cardiovascular;  Laterality: N/A;     OB History   No obstetric history on file.     No family history on file.  Social History   Tobacco Use   Smoking status: Never   Smokeless tobacco: Never  Vaping Use   Vaping Use: Never used  Substance Use Topics   Alcohol use: No   Drug use: No    Home Medications Prior to Admission medications   Medication Sig Start Date End Date Taking? Authorizing Provider  gabapentin (NEURONTIN) 300 MG capsule Take 1 capsule (300 mg total) by mouth 3 (three) times daily. 09/19/20  Yes Madyn Ivins, MD  acetaminophen (TYLENOL) 325 MG tablet Take 2 tablets (650 mg total) by mouth every 6 (six) hours as needed for up to 30 doses for mild pain or moderate pain. Patient not taking: No sig reported 05/28/20   Wyvonnia Dusky, MD  albuterol (VENTOLIN HFA) 108 (90 Base) MCG/ACT inhaler Inhale 1-2 puffs into the lungs every  6 (six) hours as needed for wheezing or shortness of breath. 04/22/20   King, Crystal M, NP  aspirin EC 81 MG tablet Take 81 mg by mouth daily. Swallow whole.    [provider]  Blood Glucose Monitoring Suppl (ONETOUCH VERIO) w/Device KIT Use 1-4 times daily as needed/directed DX E11.9 Patient not taking: No sig reported 04/25/20   King, Crystal M, NP  chlorthalidone (HYGROTON) 25 MG tablet Take 1 tablet (25 mg total) by mouth daily. 04/22/20   King, Crystal M,  NP  cloNIDine (CATAPRES) 0.2 MG tablet Take 1 tablet (0.2 mg total) by mouth 2 (two) times daily. Patient not taking: Reported on 09/01/2020 12/10/19   Mani, Mario, PA-C  clopidogrel (PLAVIX) 75 MG tablet Take 1 tablet (75 mg total) by mouth daily. 04/22/20   King, Crystal M, NP  diclofenac Sodium (VOLTAREN) 1 % GEL Apply 2 g topically 4 (four) times daily as needed. 07/27/20   Rees, Elizabeth, MD  diphenhydramine-acetaminophen (TYLENOL PM) 25-500 MG TABS tablet Take 1 tablet by mouth at bedtime.    [provider]  diphenhydramine-acetaminophen (TYLENOL PM) 25-500 MG TABS tablet Take 1 tablet by mouth at bedtime as needed (sleep and pain).    [provider]  glipiZIDE (GLUCOTROL) 5 MG tablet Take 1 tablet (5 mg total) by mouth 2 (two) times daily before a meal. 04/22/20 04/22/21  King, Crystal M, NP  hydrALAZINE (APRESOLINE) 50 MG tablet Take 1 tablet (50 mg total) by mouth 2 (two) times daily. 04/22/20   King, Crystal M, NP  Lancets (ONETOUCH ULTRASOFT) lancets Use 1-4 times daily as needed/directed  DX E11.9 Patient not taking: No sig reported 04/25/20   King, Crystal M, NP  lidocaine (LIDODERM) 5 % Place 1 patch onto the skin daily. Remove & Discard patch within 12 hours or as directed by MD Patient not taking: No sig reported 04/22/20 04/22/21  King, Crystal M, NP  lidocaine (LIDODERM) 5 % Place 1 patch onto the skin daily. Remove & Discard patch within 12 hours or as directed by MD Patient not taking: Reported on 09/01/2020 07/21/20   Palumbo, April, MD  losartan (COZAAR) 50 MG tablet Take 50 mg by mouth daily. 05/07/20   [provider]  metFORMIN (GLUCOPHAGE) 500 MG tablet Take 1.5 tablets (750 mg total) by mouth 2 (two) times daily with a meal. Patient taking differently: Take 1,000 mg by mouth 2 (two) times daily with a meal. 10/27/19 09/01/20  Delo, Douglas, MD  methocarbamol (ROBAXIN) 500 MG tablet TAKE 1 TABLET BY MOUTH 4 TIMES DAILY. Patient not taking: No sig reported 05/09/20    King, Crystal M, NP  ONETOUCH VERIO test strip Use 1-4 times daily as directed/needed   DX E11.9 Patient not taking: No sig reported 04/25/20   King, Crystal M, NP  oxyCODONE (ROXICODONE) 5 MG immediate release tablet Take 1 tablet (5 mg total) by mouth every 6 (six) hours as needed for up to 4 doses for severe pain. 05/28/20   Trifan, Matthew J, MD  potassium chloride (KLOR-CON) 10 MEQ tablet Take 1 tablet (10 mEq total) by mouth daily. Patient not taking: Reported on 09/01/2020 04/25/20   Miller, Brian, MD  potassium chloride (MICRO-K) 10 MEQ CR capsule Take 10 mEq by mouth daily.    [provider]  potassium chloride SA (KLOR-CON) 20 MEQ tablet Take 1 tablet (20 mEq total) by mouth 2 (two) times daily. Patient not taking: Reported on 09/01/2020 07/13/20   Zackowski, Scott, MD    rosuvastatin (CRESTOR) 10 MG tablet Take 1 tablet (10 mg total) by mouth at bedtime. 04/22/20 04/22/21  King, Crystal M, NP  traMADol (ULTRAM) 50 MG tablet Take 1 tablet (50 mg total) by mouth every 6 (six) hours as needed. 09/09/20   Delo, Douglas, MD    Allergies    Codeine, Ibuprofen, and Imdur [isosorbide nitrate]  Review of Systems   Review of Systems  Constitutional:  Negative for chills and fever.  HENT:  Negative for ear pain and sore throat.   Eyes:  Negative for pain and visual disturbance.  Respiratory:  Negative for cough and shortness of breath.   Cardiovascular:  Negative for chest pain and palpitations.  Gastrointestinal:  Negative for abdominal pain and vomiting.  Genitourinary:  Negative for dysuria and hematuria.  Musculoskeletal:  Positive for neck pain. Negative for arthralgias and back pain.       Leg pain  Skin:  Negative for color change and rash.  Neurological:  Positive for headaches. Negative for seizures and syncope.  All other systems reviewed and are negative.  Physical Exam Updated Vital Signs BP (!) 171/68   Pulse (!) 51   Temp 98.5 F (36.9 C)   Resp 16   SpO2 97%    Physical Exam Vitals and nursing note reviewed.  Constitutional:      General: She is not in acute distress.    Appearance: She is well-developed.  HENT:     Head: Normocephalic and atraumatic.  Eyes:     Conjunctiva/sclera: Conjunctivae normal.  Cardiovascular:     Rate and Rhythm: Normal rate and regular rhythm.     Heart sounds: No murmur heard. Pulmonary:     Effort: Pulmonary effort is normal. No respiratory distress.     Breath sounds: Normal breath sounds.  Abdominal:     Palpations: Abdomen is soft.     Tenderness: There is no abdominal tenderness.  Musculoskeletal:     Cervical back: Neck supple.  Skin:    General: Skin is warm and dry.  Neurological:     Mental Status: She is alert and oriented to person, place, and time.     Cranial Nerves: No cranial nerve deficit.     Sensory: No sensory deficit.     Motor: No weakness.    ED Results / Procedures / Treatments   Labs (all labs ordered are listed, but only abnormal results are displayed) Labs Reviewed - No data to display  EKG None  Radiology No results found.  Procedures Procedures   Medications Ordered in ED Medications  gabapentin (NEURONTIN) capsule 300 mg (300 mg Oral Given 09/19/20 2253)    ED Course  I have reviewed the triage vital signs and the nursing notes.  Pertinent labs & imaging results that were available during my care of the patient were reviewed by me and considered in my medical decision making (see chart for details).    MDM Rules/Calculators/A&P                           Pt seen in the ED for evaluation of neck pain. Physical exam unremarkable. Gabapentin given with significant improvement of her symptoms. Ace wraps applied to b/l legs. Low suspicion for acute cervical pathology at this time. Pt safe for dc on re-eval and dc.   Final Clinical Impression(s) / ED Diagnoses Final diagnoses:  Cervical radiculopathy  Neck pain    Rx / DC Orders ED Discharge Orders             Ordered    gabapentin (NEURONTIN) 300 MG capsule  3 times daily        09/19/20 2338             Kommor, [place], MD 09/29/20 1610  

## 2020-10-03 ENCOUNTER — Telehealth: Payer: Self-pay | Admitting: Clinical

## 2020-10-03 NOTE — Telephone Encounter (Signed)
[facility] General Follow Up Note  10/03/2020 Name: Lauria Depoy MRN: [id number] DOB: 05-09-1945 Keimani Laufer is a 76 y.o. year old female who sees Pcp, No for primary care. LCSW was initially consulted to assist with scheduling patient to see PCP.   Interpreter: No.   Interpreter Name & Language: none  Assessment: Patient experiencing frequent ED visits. Patient has a [facility]) 10/05/20.  Ongoing Intervention: Today CSW called patient and reminded patient of her appointment at [facility] on 9/14. Patient is in need of transportation assistance; CSW scheduled ride with [facility] transportation services for patient to the appointment.   Review of patient status, including review of consultants reports, relevant laboratory and other test results, and collaboration with appropriate care team members and the patient's provider was performed as part of comprehensive patient evaluation and provision of services.    Abigail Butts, LCSW [facility] [phone]

## 2020-10-04 ENCOUNTER — Emergency Department (HOSPITAL_COMMUNITY): Payer: Medicare Other

## 2020-10-04 ENCOUNTER — Emergency Department (HOSPITAL_COMMUNITY)
Admission: EM | Admit: 2020-10-04 | Discharge: 2020-10-05 | Disposition: A | Discharge status: Home / Self Care | Payer: Medicare Other | Attending: Emergency Medicine | Admitting: Emergency Medicine

## 2020-10-04 DIAGNOSIS — Z8616 Personal history of COVID-19: Secondary | ICD-10-CM | POA: Insufficient documentation

## 2020-10-04 DIAGNOSIS — E119 Type 2 diabetes mellitus without complications: Secondary | ICD-10-CM | POA: Insufficient documentation

## 2020-10-04 DIAGNOSIS — I10 Essential (primary) hypertension: Secondary | ICD-10-CM | POA: Insufficient documentation

## 2020-10-04 DIAGNOSIS — Z955 Presence of coronary angioplasty implant and graft: Secondary | ICD-10-CM | POA: Insufficient documentation

## 2020-10-04 DIAGNOSIS — Z7982 Long term (current) use of aspirin: Secondary | ICD-10-CM | POA: Diagnosis not present

## 2020-10-04 DIAGNOSIS — R519 Headache, unspecified: Secondary | ICD-10-CM | POA: Diagnosis present

## 2020-10-04 DIAGNOSIS — Z7984 Long term (current) use of oral hypoglycemic drugs: Secondary | ICD-10-CM | POA: Diagnosis not present

## 2020-10-04 DIAGNOSIS — M7989 Other specified soft tissue disorders: Secondary | ICD-10-CM | POA: Insufficient documentation

## 2020-10-04 DIAGNOSIS — Z79899 Other long term (current) drug therapy: Secondary | ICD-10-CM | POA: Diagnosis not present

## 2020-10-04 NOTE — ED Triage Notes (Signed)
Pt c/o HA & leg swelling, "for awhile." Unvaccinated against covid, states no known sick contact unless 74yo grandchild. Does not want covid test

## 2020-10-04 NOTE — ED Provider Notes (Signed)
Emergency Medicine Provider Triage Evaluation Note  [name] , a 75 y.o. female  was evaluated in triage.  Pt complains of cough, body aches, headache for 1 week.  Denies fevers.  Denies being vaccinated against COVID  Review of Systems  Positive: Cough, body aches, headache Negative: Fever  Physical Exam  BP (!) 185/77 (BP Location: Right Arm)   Pulse 74   Temp 98.3 F (36.8 C) (Oral)   Resp 16   SpO2 100%  Gen:   Awake, no distress   Resp:  Normal effort  MSK:   Moves extremities without difficulty  Other:  Smile symmetric, 5/5 strength to the bue/ble, ambulatory with steady gait  Medical Decision Making  Medically screening exam initiated at 9:13 PM.  Appropriate orders placed.  [name] was informed that the remainder of the evaluation will be completed by another provider, this initial triage assessment does not replace that evaluation, and the importance of remaining in the ED until their evaluation is complete.  Patient declines COVID testing.   [name], Adriana Virella [name], [name] 10/04/20 2114    Adriana Forts, DO 10/05/20 0002

## 2020-10-05 ENCOUNTER — Other Ambulatory Visit: Payer: Self-pay

## 2020-10-05 ENCOUNTER — Encounter: Payer: Self-pay | Admitting: Nurse Practitioner

## 2020-10-05 ENCOUNTER — Ambulatory Visit (INDEPENDENT_AMBULATORY_CARE_PROVIDER_SITE_OTHER): Payer: Medicare Other | Admitting: Nurse Practitioner

## 2020-10-05 VITALS — BP 181/63 | HR 79 | Temp 97.5°F | Ht 62.0 in | Wt 139.2 lb

## 2020-10-05 DIAGNOSIS — Z8673 Personal history of transient ischemic attack (TIA), and cerebral infarction without residual deficits: Secondary | ICD-10-CM

## 2020-10-05 DIAGNOSIS — I10 Essential (primary) hypertension: Secondary | ICD-10-CM

## 2020-10-05 DIAGNOSIS — E0800 Diabetes mellitus due to underlying condition with hyperosmolarity without nonketotic hyperglycemic-hyperosmolar coma (NKHHC): Secondary | ICD-10-CM

## 2020-10-05 LAB — POCT GLYCOSYLATED HEMOGLOBIN (HGB A1C)
HbA1c POC (<> result, manual entry): 5.7 % (ref 4.0–5.6)
HbA1c, POC (controlled diabetic range): 5.7 % (ref 0.0–7.0)
HbA1c, POC (prediabetic range): 5.7 % (ref 5.7–6.4)
Hemoglobin A1C: 5.7 % — AB (ref 4.0–5.6)

## 2020-10-05 LAB — BASIC METABOLIC PANEL
Anion gap: 9 (ref 5–15)
BUN: 12 mg/dL (ref 8–23)
CO2: 27 mmol/L (ref 22–32)
Calcium: 8.8 mg/dL — ABNORMAL LOW (ref 8.9–10.3)
Chloride: 102 mmol/L (ref 98–111)
Creatinine, Ser: 0.93 mg/dL (ref 0.44–1.00)
GFR, Estimated: >60 mL/min (ref >60)
Glucose, Bld: 100 mg/dL — ABNORMAL HIGH (ref 70–99)
Potassium: 3.6 mmol/L (ref 3.5–5.1)
Sodium: 138 mmol/L (ref 135–145)

## 2020-10-05 LAB — CBC WITH DIFFERENTIAL/PLATELET
Abs Immature Granulocytes: 0.02 10*3/uL (ref 0.00–0.07)
Basophils Absolute: 0 10*3/uL (ref 0.0–0.1)
Basophils Relative: 1 %
Eosinophils Absolute: 0.1 10*3/uL (ref 0.0–0.5)
Eosinophils Relative: 2 %
HCT: 33.3 % — ABNORMAL LOW (ref 36.0–46.0)
Hemoglobin: 10.5 g/dL — ABNORMAL LOW (ref 12.0–15.0)
Immature Granulocytes: 0 %
Lymphocytes Relative: 20 %
Lymphs Abs: 1.3 10*3/uL (ref 0.7–4.0)
MCH: 29.6 pg (ref 26.0–34.0)
MCHC: 31.5 g/dL (ref 30.0–36.0)
MCV: 93.8 fL (ref 80.0–100.0)
Monocytes Absolute: 0.4 10*3/uL (ref 0.1–1.0)
Monocytes Relative: 6 %
Neutro Abs: 4.7 10*3/uL (ref 1.7–7.7)
Neutrophils Relative %: 71 %
Platelets: 278 10*3/uL (ref 150–400)
RBC: 3.55 MIL/uL — ABNORMAL LOW (ref 3.87–5.11)
RDW: 12.3 % (ref 11.5–15.5)
WBC: 6.7 10*3/uL (ref 4.0–10.5)
nRBC: 0 % (ref 0.0–0.2)

## 2020-10-05 LAB — POCT URINALYSIS DIP (CLINITEK)
Bilirubin, UA: NEGATIVE
Blood, UA: NEGATIVE
Glucose, UA: NEGATIVE mg/dL
Ketones, POC UA: NEGATIVE mg/dL
Nitrite, UA: NEGATIVE
POC PROTEIN,UA: NEGATIVE
Spec Grav, UA: 1.02 (ref 1.010–1.025)
Urobilinogen, UA: 0.2 E.U./dL
pH, UA: 7 (ref 5.0–8.0)

## 2020-10-05 LAB — GLUCOSE, POCT (MANUAL RESULT ENTRY): POC Glucose: 69 mg/dl — AB (ref 70–99)

## 2020-10-05 MED ORDER — GABAPENTIN 300 MG PO CAPS
300.0000 mg | ORAL_CAPSULE | Freq: Three times a day (TID) | ORAL | Status: DC
  Administered 2020-10-05: 300 mg via ORAL
  Filled 2020-10-05: qty 1

## 2020-10-05 MED ORDER — METFORMIN HCL 500 MG PO TABS: 1000.0000 mg | ORAL_TABLET | Freq: Two times a day (BID) | ORAL | 3 refills | Status: DC

## 2020-10-05 MED ORDER — GABAPENTIN 300 MG PO CAPS: 300.0000 mg | ORAL_CAPSULE | Freq: Three times a day (TID) | ORAL | 0 refills | Status: DC

## 2020-10-05 MED ORDER — ACETAMINOPHEN 325 MG PO TABS
650.0000 mg | ORAL_TABLET | Freq: Once | ORAL | Status: AC
  Administered 2020-10-05: 650 mg via ORAL
  Filled 2020-10-05: qty 2

## 2020-10-05 MED ORDER — GLIPIZIDE 5 MG PO TABS
5.0000 mg | ORAL_TABLET | Freq: Two times a day (BID) | ORAL | Status: DC
  Administered 2020-10-05: 5 mg via ORAL
  Filled 2020-10-05: qty 1

## 2020-10-05 MED ORDER — BENZONATATE 100 MG PO CAPS: 100.0000 mg | ORAL_CAPSULE | Freq: Three times a day (TID) | ORAL | 0 refills | Status: AC | PRN

## 2020-10-05 MED ORDER — METFORMIN HCL 500 MG PO TABS
1000.0000 mg | ORAL_TABLET | Freq: Two times a day (BID) | ORAL | Status: DC
  Administered 2020-10-05: 1000 mg via ORAL
  Filled 2020-10-05: qty 2

## 2020-10-05 MED ORDER — ROSUVASTATIN CALCIUM 10 MG PO TABS: 10.0000 mg | ORAL_TABLET | Freq: Every day | ORAL | 3 refills | Status: DC

## 2020-10-05 MED ORDER — CHLORTHALIDONE 25 MG PO TABS: 25.0000 mg | ORAL_TABLET | Freq: Every day | ORAL | 3 refills | Status: DC

## 2020-10-05 MED ORDER — CLOPIDOGREL BISULFATE 75 MG PO TABS
75.0000 mg | ORAL_TABLET | Freq: Every day | ORAL | Status: DC
  Administered 2020-10-05: 75 mg via ORAL
  Filled 2020-10-05: qty 1

## 2020-10-05 MED ORDER — CHLORTHALIDONE 25 MG PO TABS
25.0000 mg | ORAL_TABLET | Freq: Every day | ORAL | Status: DC
  Administered 2020-10-05: 25 mg via ORAL
  Filled 2020-10-05: qty 1

## 2020-10-05 MED ORDER — LOSARTAN POTASSIUM 50 MG PO TABS: 50.0000 mg | ORAL_TABLET | Freq: Every day | ORAL | 3 refills | Status: DC

## 2020-10-05 MED ORDER — HYDRALAZINE HCL 20 MG/ML IJ SOLN: 10.0000 mg | Freq: Once | INTRAMUSCULAR | Status: DC

## 2020-10-05 MED ORDER — HYDRALAZINE HCL 25 MG PO TABS
50.0000 mg | ORAL_TABLET | Freq: Two times a day (BID) | ORAL | Status: DC
  Administered 2020-10-05: 50 mg via ORAL
  Filled 2020-10-05: qty 2

## 2020-10-05 MED ORDER — LOSARTAN POTASSIUM 50 MG PO TABS
50.0000 mg | ORAL_TABLET | Freq: Every day | ORAL | Status: DC
  Administered 2020-10-05: 50 mg via ORAL
  Filled 2020-10-05: qty 1

## 2020-10-05 NOTE — Discharge Planning (Signed)
RNCM notes pt has PCP appointment [facility] at 11:40.  We will transport pt to appointment from ED and provide transportation home after appointment.

## 2020-10-05 NOTE — ED Provider Notes (Signed)
[facility] EMERGENCY DEPARTMENT Provider Note   CSN: [id number] Arrival date & time: 10/04/20  1956     History Chief Complaint  Patient presents with   Headache   Leg Swelling    [name] is a 75 y.o. female.  Pt presents to the ED today with headache and leg swelling.  Pt has a hx of htn and said she's been compliant with her meds.  Pt was here on 9/6 for htn.  She has also been here frequently for various complaints over the past few months.  I asked pt about her pcp and she said her pcp moved and she did not know where.  She is not sure when she last refilled all of her meds.        Past Medical History:  Diagnosis Date   Diabetes mellitus without complication ([place])    GERD 09/04/2006   Qualifier: Diagnosis of  By: Suzie Portela     History of echocardiogram    a. 2D ECHO: 11/06/2013 EF 65%; no WMA. Mild TR. 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   Numbness 06/20/2012   Diabetes mellitus ([place]) 06/20/2012   CVA (cerebral infarction) 06/20/2012   Hypokalemia 06/20/2012   ARTHRALGIA 01/28/2009   CHEST PAIN UNSPECIFIED 11/26/2008   DEPRESSION 09/04/2006   Essential hypertension 09/04/2006   GERD 09/04/2006   MULTIPLE SITES, ARTHRITIS-ACUTE/CHRONIC 09/04/2006   LOW BACK PAIN, CHRONIC 09/04/2006    Past Surgical History:  Procedure Laterality Date   ABDOMINAL HYSTERECTOMY     ANTERIOR CERVICAL DECOMPRESSION/DISCECTOMY FUSION 4 LEVELS N/A 11/11/2019   Procedure: Cervical three-four Cervical four-five Cervical five-six  Cervical six-seven  Anterior cervical decompression/discectomy/fusion;  Surgeon: Erline Levine, MD;  Location: [place];  Service: Neurosurgery;  Laterality: N/A;   LEFT HEART CATHETERIZATION WITH CORONARY ANGIOGRAM N/A 11/05/2013   Procedure: LEFT HEART CATHETERIZATION WITH CORONARY ANGIOGRAM;  Surgeon: Troy Sine, MD;  Location: [facility] CATH LAB;  Service: Cardiovascular;  Laterality: N/A;     OB History   No obstetric history on file.     No family history on file.  Social History   Tobacco Use   Smoking status: Never   Smokeless tobacco: Never  Vaping Use   Vaping Use: Never used  Substance Use Topics   Alcohol use: No   Drug use: No    Home Medications Prior to Admission medications   Medication Sig Start Date End Date Taking? Authorizing Provider  acetaminophen (TYLENOL) 325 MG tablet Take 2 tablets (650 mg total) by mouth every 6 (six) hours as needed for up to 30 doses for mild pain or moderate pain. Patient not taking: No sig reported 05/28/20   Wyvonnia Dusky, MD  albuterol (VENTOLIN HFA) 108 (90 Base) MCG/ACT inhaler Inhale 1-2 puffs into the lungs every 6 (six) hours as needed for wheezing or shortness of breath. 04/22/20   Vevelyn Francois, NP  aspirin EC 81 MG tablet Take 81 mg by mouth daily. Swallow whole.    [provider]  Blood Glucose Monitoring  Suppl [facility] VERIO) w/Device KIT Use 1-4 times daily as needed/directed DX E11.9 Patient not taking: No sig reported 04/25/20   Vevelyn Francois, NP  chlorthalidone (HYGROTON) 25 MG tablet Take 1 tablet (25 mg total) by mouth daily. 04/22/20   Vevelyn Francois, NP  cloNIDine (CATAPRES) 0.2 MG tablet Take 1 tablet (0.2 mg total) by mouth 2 (two) times daily. Patient not taking: Reported on 09/01/2020 12/10/19   Jaynee Eagles, PA-C  clopidogrel (PLAVIX) 75 MG tablet Take 1 tablet (75 mg total) by mouth daily. 04/22/20   Vevelyn Francois, NP  diclofenac Sodium (VOLTAREN) 1 % GEL Apply 2 g topically 4 (four) times daily as  needed. 07/27/20   Quintella Reichert, MD  diphenhydramine-acetaminophen (TYLENOL PM) 25-500 MG TABS tablet Take 1 tablet by mouth at bedtime.    [provider]  diphenhydramine-acetaminophen (TYLENOL PM) 25-500 MG TABS tablet Take 1 tablet by mouth at bedtime as needed (sleep and pain).    [provider]  gabapentin (NEURONTIN) 300 MG capsule Take 1 capsule (300 mg total) by mouth 3 (three) times daily. 09/19/20   Kommor, [place], MD  glipiZIDE (GLUCOTROL) 5 MG tablet Take 1 tablet (5 mg total) by mouth 2 (two) times daily before a meal. 04/22/20 04/22/21  Vevelyn Francois, NP  hydrALAZINE (APRESOLINE) 50 MG tablet Take 1 tablet (50 mg total) by mouth 2 (two) times daily. 04/22/20   Vevelyn Francois, NP  Lancets [facility] ULTRASOFT) lancets Use 1-4 times daily as needed/directed  DX E11.9 Patient not taking: No sig reported 04/25/20   Vevelyn Francois, NP  lidocaine (LIDODERM) 5 % Place 1 patch onto the skin daily. Remove & Discard patch within 12 hours or as directed by MD Patient not taking: No sig reported 04/22/20 04/22/21  Vevelyn Francois, NP  lidocaine (LIDODERM) 5 % Place 1 patch onto the skin daily. Remove & Discard patch within 12 hours or as directed by MD Patient not taking: Reported on 09/01/2020 07/21/20   Palumbo, April, MD  losartan (COZAAR) 50 MG tablet Take 50 mg by mouth daily. 05/07/20   [provider]  metFORMIN (GLUCOPHAGE) 500 MG tablet Take 1.5 tablets (750 mg total) by mouth 2 (two) times daily with a meal. Patient taking differently: Take 1,000 mg by mouth 2 (two) times daily with a meal. 10/27/19 09/01/20  Veryl Speak, MD  methocarbamol (ROBAXIN) 500 MG tablet TAKE 1 TABLET BY MOUTH 4 TIMES DAILY. Patient not taking: No sig reported 05/09/20   Vevelyn Francois, NP  [facility] VERIO test strip Use 1-4 times daily as directed/needed   DX E11.9 Patient not taking: No sig reported 04/25/20   Vevelyn Francois, NP  oxyCODONE (ROXICODONE) 5 MG immediate release tablet Take 1  tablet (5 mg total) by mouth every 6 (six) hours as needed for up to 4 doses for severe pain. 05/28/20   Wyvonnia Dusky, MD  potassium chloride (KLOR-CON) 10 MEQ tablet Take 1 tablet (10 mEq total) by mouth daily. Patient not taking: Reported on 09/01/2020 04/25/20   Noemi Chapel, MD  potassium chloride (MICRO-K) 10 MEQ CR capsule Take 10 mEq by mouth daily.    [provider]  potassium chloride SA (KLOR-CON) 20 MEQ tablet Take 1 tablet (20 mEq total) by mouth 2 (two) times daily. Patient not taking: Reported on 09/01/2020 07/13/20   Fredia Sorrow, MD  rosuvastatin (CRESTOR) 10 MG tablet Take 1 tablet (10 mg total) by mouth at bedtime. 04/22/20 04/22/21  Vevelyn Francois, NP  traMADol (ULTRAM) 50 MG tablet Take 1 tablet (50 mg total) by mouth every 6 (six) hours as needed. 09/09/20   Veryl Speak, MD    Allergies    Codeine, Ibuprofen, and Imdur [isosorbide nitrate]  Review of Systems   Review of Systems  Neurological:  Positive for headaches.  All other systems reviewed and are negative.  Physical Exam Updated Vital Signs BP (!) 198/87 (BP Location: Left Arm)   Pulse 68   Temp (!) 97.5 F (36.4 C)   Resp 18   SpO2 100%   Physical Exam Vitals and nursing note reviewed.  Constitutional:      Appearance: She is well-developed.  HENT:     Head: Normocephalic and atraumatic.  Eyes:     Extraocular Movements: Extraocular movements intact.     Pupils: Pupils are equal, round, and reactive to light.  Cardiovascular:     Rate and Rhythm: Normal rate and regular rhythm.  Pulmonary:     Effort: Pulmonary effort is normal.     Breath sounds: Normal breath sounds.  Abdominal:     General: Bowel sounds are normal.     Palpations: Abdomen is soft.  Musculoskeletal:        General: Normal range of motion.     Cervical back: Normal range of motion and neck supple.  Skin:    General: Skin is warm.     Capillary Refill: Capillary refill takes less than 2 seconds.  Neurological:      Mental Status: She is alert and oriented to person, place, and time.  Psychiatric:        Mood and Affect: Mood normal.        Behavior: Behavior normal.    ED Results / Procedures / Treatments   Labs (all labs ordered are listed, but only abnormal results are displayed) Labs Reviewed  BASIC METABOLIC PANEL  URINALYSIS, ROUTINE W REFLEX MICROSCOPIC  CBC WITH DIFFERENTIAL/PLATELET    EKG None  Radiology DG Chest 2 View  Result Date: 10/04/2020 CLINICAL DATA:  Headache and leg swelling. EXAM: CHEST - 2 VIEW COMPARISON:  September 01, 2020 FINDINGS: The lungs are mildly hyperinflated. There is no evidence of acute infiltrate, pleural effusion or pneumothorax. The heart size and mediastinal contours are within normal limits. A radiopaque fusion plate and screws are seen overlying the lower cervical spine. IMPRESSION: No active cardiopulmonary disease. Electronically Signed   By: Virgina Norfolk M.D.   On: 10/04/2020 22:14   CT HEAD WO CONTRAST (5MM)  Result Date: 10/04/2020 CLINICAL DATA:  Worsening headache. EXAM: CT HEAD WITHOUT CONTRAST TECHNIQUE: Contiguous axial images were obtained from the base of the skull through the vertex without intravenous contrast. COMPARISON:  August 25, 2020 FINDINGS: Brain: There is mild cerebral atrophy with widening of the extra-axial spaces and ventricular dilatation. There are areas of decreased attenuation within the white matter tracts of the supratentorial brain, consistent with microvascular disease changes. Vascular: No hyperdense vessel or unexpected calcification. Skull: Normal. Negative for fracture or focal lesion. Sinuses/Orbits: No acute finding. Other: None. IMPRESSION: 1. Generalized cerebral atrophy. 2. No acute intracranial abnormality. Electronically Signed   By: Virgina Norfolk M.D.   On: 10/04/2020 21:48    Procedures Procedures   Medications Ordered in ED Medications  acetaminophen (TYLENOL) tablet 650 mg (has no administration  in time range)  chlorthalidone (HYGROTON) tablet 25 mg (has no administration in time range)  clopidogrel (PLAVIX) tablet 75 mg (has no administration in  time range)  gabapentin (NEURONTIN) capsule 300 mg (has no administration in time range)  glipiZIDE (GLUCOTROL) tablet 5 mg (has no administration in time range)  hydrALAZINE (APRESOLINE) tablet 50 mg (has no administration in time range)  losartan (COZAAR) tablet 50 mg (has no administration in time range)  metFORMIN (GLUCOPHAGE) tablet 1,000 mg (has no administration in time range)    ED Course  I have reviewed the triage vital signs and the nursing notes.  Pertinent labs & imaging results that were available during my care of the patient were reviewed by me and considered in my medical decision making (see chart for details).    MDM Rules/Calculators/A&P                           Pt is hypertensive.  I have consulted toc to get pt in contact with a pcp.  Upon epic review, sw was consulted before to help with pcp.  Pt was supposed to have a pcp visit with the patient care center today.  I called the office to see the time, but was put on hold for several minutes.  Our case manager was able to see she had an appt at 11:40.  We will give pt some food and her normal home am meds.  They, we will provide transport for her over to the dr's office.  Pt did not recall this appt was today even though they called her on 9/12 to remind her.   Pt is stable for d/c.  Taxi ready to take her to her pcp.  Return if worse.   Final Clinical Impression(s) / ED Diagnoses Final diagnoses:  Hypertension, unspecified type    Rx / DC Orders ED Discharge Orders     None        Isla Pence, MD 10/05/20 1431

## 2020-10-05 NOTE — ED Notes (Signed)
Reviewed discharge instructions with patient. Follow-up care reviewed. Patient verbalized understanding. Patient A&Ox4, VSS, and ambulatory with steady gait upon discharge. Pt riding via taxi to PCP appt.

## 2020-10-05 NOTE — Progress Notes (Signed)
[facility] Patient [facility] [address] [place], [place]  [address] Phone:  [phone]   Fax:  [phone]   Established Patient Office Visit  Subjective:  Patient ID: [name], female    DOB: [birth year]  Age: 75 y.o. MRN: [id number]  CC:  Chief Complaint  Patient presents with   Hospitalization Follow-up    Pt is here today for hospital follow up. Pt was seen for elevated blood pressure on yesterday. Pt symptoms were headache, bodyaches, and cough. Pt complains today of bodyaches, legs and feet pain, headaches.    HPI [name] presents for follow up. She continues to go to [facility] frequently for any symptoms.  She actually showed up this morning at the emergency room when she had an appointment.  The emergency room had her escorted here for follow-up appointment.  She continues to state that office is too far away from her home. She  has a past medical history of Diabetes mellitus without complication (HCC), GERD (09/04/2006), History of echocardiogram, Hypertension, and Stroke (HCC).   She reports having 3 daughters and 1 granddaughter age 44.  She talks about her granddaughter Ashante.  She reports that her daughter [name] does provide her with regular groceries.  She has requested that someone come to live with her.  They have all declined reported they want to live on their own.  [name] reports that she is wanting to have her medications evaluated.  She did not bring them in today.  She reports going to the emergency room today due to headache and abdominal pain.  She did have labs completed.  It looks like she has chronic anemia.  She denies any blood loss.  She is currently on Plavix 75 mg due to previous CVA.  She has diabetes her A1c in April was 10.5%.  She reports taking her metformin and glipizide.  She is concerned about a medication that she was taking that she is no longer using and feels like she needs.  She reports speaking with [facility] on several occasions regarding this with no help.  She will be requesting additional information on this "black peel". She has several blood pressure medications.  Current blood pressure is 181/63.  The clonidine/Catapres has not been filled since November 2021.  The losartan 50 mg does not seem to have been refilled for some time. The hydroxyzine 50 mg twice a day was refilled however has not had any additional refills.  Past Medical History:  Diagnosis Date   Diabetes mellitus without complication (HCC)    GERD 09/04/2006   Qualifier: Diagnosis of  By: Duke Salvia     History of echocardiogram    a. 2D ECHO: 11/06/2013 EF 65%; no WMA. Mild TR. PA pk pressure 43 mm HG   Hypertension    Stroke [facility])     Past Surgical History:  Procedure Laterality Date   ABDOMINAL HYSTERECTOMY     ANTERIOR CERVICAL DECOMPRESSION/DISCECTOMY FUSION 4 LEVELS N/A 11/11/2019   Procedure: Cervical three-four Cervical four-five Cervical five-six Cervical six-seven  Anterior cervical decompression/discectomy/fusion;  Surgeon: Maeola Harman, MD;  Location: [facility] OR;  Service: Neurosurgery;  Laterality: N/A;   LEFT HEART CATHETERIZATION WITH CORONARY ANGIOGRAM N/A 11/05/2013   Procedure: LEFT HEART CATHETERIZATION WITH CORONARY ANGIOGRAM;  Surgeon: Lennette Bihari, MD;  Location: [facility] CATH LAB;  Service: Cardiovascular;  Laterality: N/A;    History reviewed. No pertinent family history.  Social History   Socioeconomic History  Marital status: Widowed    Spouse name: Not on file   Number of children: Not on file   Years of education: Not on file   Highest education level: Not on file  Occupational History   Not on file  Tobacco Use   Smoking status: Never   Smokeless tobacco: Never  Vaping Use   Vaping Use: Never used  Substance and Sexual Activity   Alcohol use: No   Drug use: No   Sexual activity: Never  Other Topics Concern   Not on file  Social History Narrative   Not on file   Social  Determinants of Health   Financial Resource Strain: Not on file  Food Insecurity: Not on file  Transportation Needs: Not on file  Physical Activity: Not on file  Stress: Not on file  Social Connections: Not on file  Intimate Partner Violence: Not on file    Outpatient Medications Prior to Visit  Medication Sig Dispense Refill   albuterol (VENTOLIN HFA) 108 (90 Base) MCG/ACT inhaler Inhale 1-2 puffs into the lungs every 6 (six) hours as needed for wheezing or shortness of breath. 18 g 0   aspirin EC 81 MG tablet Take 81 mg by mouth daily. Swallow whole.     clopidogrel (PLAVIX) 75 MG tablet Take 1 tablet (75 mg total) by mouth daily. 30 tablet 0   diclofenac Sodium (VOLTAREN) 1 % GEL Apply 2 g topically 4 (four) times daily as needed. 50 g 0   diphenhydramine-acetaminophen (TYLENOL PM) 25-500 MG TABS tablet Take 1 tablet by mouth at bedtime.     glipiZIDE (GLUCOTROL) 5 MG tablet Take 1 tablet (5 mg total) by mouth 2 (two) times daily before a meal. 180 tablet 3   hydrALAZINE (APRESOLINE) 50 MG tablet Take 1 tablet (50 mg total) by mouth 2 (two) times daily. 30 tablet 0   chlorthalidone (HYGROTON) 25 MG tablet Take 1 tablet (25 mg total) by mouth daily. 30 tablet 0   diphenhydramine-acetaminophen (TYLENOL PM) 25-500 MG TABS tablet Take 1 tablet by mouth at bedtime as needed (sleep and pain).     gabapentin (NEURONTIN) 300 MG capsule Take 1 capsule (300 mg total) by mouth 3 (three) times daily. 90 capsule 0   losartan (COZAAR) 50 MG tablet Take 50 mg by mouth daily.     oxyCODONE (ROXICODONE) 5 MG immediate release tablet Take 1 tablet (5 mg total) by mouth every 6 (six) hours as needed for up to 4 doses for severe pain. 4 tablet 0   potassium chloride (MICRO-K) 10 MEQ CR capsule Take 10 mEq by mouth daily.     rosuvastatin (CRESTOR) 10 MG tablet Take 1 tablet (10 mg total) by mouth at bedtime. 90 tablet 3   traMADol (ULTRAM) 50 MG tablet Take 1 tablet (50 mg total) by mouth every 6 (six)  hours as needed. 15 tablet 0   acetaminophen (TYLENOL) 325 MG tablet Take 2 tablets (650 mg total) by mouth every 6 (six) hours as needed for up to 30 doses for mild pain or moderate pain. (Patient not taking: No sig reported) 30 tablet 0   Blood Glucose Monitoring Suppl (ONETOUCH VERIO) w/Device KIT Use 1-4 times daily as needed/directed DX E11.9 (Patient not taking: No sig reported) 1 kit 0   cloNIDine (CATAPRES) 0.2 MG tablet Take 1 tablet (0.2 mg total) by mouth 2 (two) times daily. (Patient not taking: No sig reported) 60 tablet 0   gatifloxacin (ZYMAXID) 0.5 % SOLN Place 1  drop into the right eye 4 (four) times daily.     ketorolac (ACULAR) 0.5 % ophthalmic solution Place 1 drop into the right eye 4 (four) times daily.     Lancets (ONETOUCH ULTRASOFT) lancets Use 1-4 times daily as needed/directed  DX E11.9 (Patient not taking: No sig reported) 200 each 12   lidocaine (LIDODERM) 5 % Place 1 patch onto the skin daily. Remove & Discard patch within 12 hours or as directed by MD (Patient not taking: No sig reported) 90 patch 3   lidocaine (LIDODERM) 5 % Place 1 patch onto the skin daily. Remove & Discard patch within 12 hours or as directed by MD (Patient not taking: No sig reported) 30 patch 0   ONETOUCH VERIO test strip Use 1-4 times daily as directed/needed   DX E11.9 (Patient not taking: No sig reported) 200 each 12   prednisoLONE acetate (PRED FORTE) 1 % ophthalmic suspension Place 1 drop into the right eye 4 (four) times daily.     metFORMIN (GLUCOPHAGE) 500 MG tablet Take 1.5 tablets (750 mg total) by mouth 2 (two) times daily with a meal. (Patient taking differently: Take 1,000 mg by mouth 2 (two) times daily with a meal.) 90 tablet 0   methocarbamol (ROBAXIN) 500 MG tablet TAKE 1 TABLET BY MOUTH 4 TIMES DAILY. (Patient not taking: No sig reported) 40 tablet 0   potassium chloride (KLOR-CON) 10 MEQ tablet Take 1 tablet (10 mEq total) by mouth daily. (Patient not taking: Reported on 10/05/2020)  5 tablet 0   potassium chloride SA (KLOR-CON) 20 MEQ tablet Take 1 tablet (20 mEq total) by mouth 2 (two) times daily. (Patient not taking: No sig reported) 4 tablet 0   chlorthalidone (HYGROTON) tablet 25 mg      clopidogrel (PLAVIX) tablet 75 mg      gabapentin (NEURONTIN) capsule 300 mg      glipiZIDE (GLUCOTROL) tablet 5 mg      hydrALAZINE (APRESOLINE) tablet 50 mg      losartan (COZAAR) tablet 50 mg      metFORMIN (GLUCOPHAGE) tablet 1,000 mg      No facility-administered medications prior to visit.    Allergies  Allergen Reactions   Codeine Other (See Comments)    REACTION: Hallucinations   Ibuprofen Other (See Comments)    REACTION: Elevated Blood Pressure   Imdur [Isosorbide Nitrate] Other (See Comments)    Headache & upset stomach    ROS Review of Systems    Objective:    Physical Exam Constitutional:      General: She is not in acute distress.    Appearance: She is not ill-appearing, toxic-appearing or diaphoretic.  HENT:     Head: Normocephalic and atraumatic.     Nose: Nose normal.     Mouth/Throat:     Mouth: Mucous membranes are moist.  Cardiovascular:     Rate and Rhythm: Normal rate and regular rhythm.     Pulses: Normal pulses.     Heart sounds: Normal heart sounds.  Pulmonary:     Effort: Pulmonary effort is normal.     Breath sounds: Normal breath sounds.  Abdominal:     Palpations: Abdomen is soft.     Comments: Hypoactive  Musculoskeletal:        General: Normal range of motion.     Cervical back: Normal range of motion.     Right lower leg: No edema.     Left lower leg: No edema.  Skin:  General: Skin is warm and dry.     Capillary Refill: Capillary refill takes less than 2 seconds.  Neurological:     General: No focal deficit present.     Mental Status: She is alert and oriented to person, place, and time.  Psychiatric:        Mood and Affect: Mood normal.        Behavior: Behavior normal.        Thought Content: Thought content  normal.        Judgment: Judgment normal.    BP (!) 181/63   Pulse 79   Temp (!) 97.5 F (36.4 C)   Ht 5\' 2"  (1.575 m)   Wt 139 lb 3.2 oz (63.1 kg)   SpO2 99%   BMI 25.46 kg/m  Wt Readings from Last 3 Encounters:  10/05/20 139 lb 3.2 oz (63.1 kg)  09/27/20 147 lb 11.3 oz (67 kg)  09/09/20 147 lb 11.3 oz (67 kg)     Health Maintenance Due  Topic Date Due   COVID-19 Vaccine (1) Never done   OPHTHALMOLOGY EXAM  Never done   Zoster Vaccines- Shingrix (1 of 2) Never done   INFLUENZA VACCINE  08/22/2020    There are no preventive care reminders to display for this patient.  Lab Results  Component Value Date   TSH 1.413 10/18/2012   Lab Results  Component Value Date   WBC 6.7 10/05/2020   HGB 10.5 (L) 10/05/2020   HCT 33.3 (L) 10/05/2020   MCV 93.8 10/05/2020   PLT 278 10/05/2020   Lab Results  Component Value Date   NA 138 10/05/2020   K 3.6 10/05/2020   CO2 27 10/05/2020   GLUCOSE 100 (H) 10/05/2020   BUN 12 10/05/2020   CREATININE 0.93 10/05/2020   BILITOT 0.6 09/28/2020   ALKPHOS 83 09/28/2020   AST 18 09/28/2020   ALT 11 09/28/2020   PROT 7.3 09/28/2020   ALBUMIN 3.4 (L) 09/28/2020   CALCIUM 8.8 (L) 10/05/2020   ANIONGAP 9 10/05/2020   Lab Results  Component Value Date   CHOL 189 06/21/2012   Lab Results  Component Value Date   HDL 68 06/21/2012   Lab Results  Component Value Date   LDLCALC 100 (H) 06/21/2012   Lab Results  Component Value Date   TRIG 107 06/21/2012   Lab Results  Component Value Date   CHOLHDL 2.8 06/21/2012   Lab Results  Component Value Date   HGBA1C 5.7 (A) 10/05/2020   HGBA1C 5.7 10/05/2020   HGBA1C 5.7 10/05/2020   HGBA1C 5.7 10/05/2020      Assessment & Plan:   Problem List Items Addressed This Visit       Cardiovascular and Mediastinum   Essential hypertension (Chronic) Persistent Chlorthalidone and losartan restarted we will hold hydralazine due to [name] inability to recognize medications.   Does not want patient to experience hypotension.  Referral to home health for evaluation of living conditions and medications along with ongoing assistance as needed   Relevant Medications   chlorthalidone (HYGROTON) 25 MG tablet   losartan (COZAAR) 50 MG tablet   rosuvastatin (CRESTOR) 10 MG tablet   Other Relevant Orders   Ambulatory referral to Home Health     Endocrine   Diabetes mellitus (HCC) - Primary (Chronic) Stable current A1c less than 7% We will continue to monitor closely Encourage patient to drink Glucerna versus Ensure   Relevant Medications   losartan (COZAAR) 50 MG tablet  rosuvastatin (CRESTOR) 10 MG tablet   metFORMIN (GLUCOPHAGE) 500 MG tablet   Other Relevant Orders   Glucose (CBG) (Completed)   HgB A1c (Completed)   POCT URINALYSIS DIP (CLINITEK) (Completed)   Ambulatory referral to [place]   Other Visit Diagnoses     History of CVA (cerebrovascular accident)       Relevant Orders   Ambulatory referral to [place] discussion with patient on the importance of following up with in the clinic versus using the emergency room as a primary care office.  Reinforced with the patient that I regret that her previous provider's office has closed.  Encouraged her if she would call our office when needed we will do all we can to assist her.  She was provided with my card as her new provider for her wallet along with the social workers card.  She verbalized understanding.    Meds ordered this encounter  Medications   chlorthalidone (HYGROTON) 25 MG tablet    Sig: Take 1 tablet (25 mg total) by mouth daily.    Dispense:  90 tablet    Refill:  3    Order Specific Question:   Supervising Provider    Answer:   Tresa Garter [[id number]]   gabapentin (NEURONTIN) 300 MG capsule    Sig: Take 1 capsule (300 mg total) by mouth 3 (three) times daily.    Dispense:  90 capsule    Refill:  0    Order Specific Question:   Supervising Provider    Answer:    Tresa Garter [[id number]]   losartan (COZAAR) 50 MG tablet    Sig: Take 1 tablet (50 mg total) by mouth daily.    Dispense:  90 tablet    Refill:  3    Order Specific Question:   Supervising Provider    Answer:   Tresa Garter [[id number]]   rosuvastatin (CRESTOR) 10 MG tablet    Sig: Take 1 tablet (10 mg total) by mouth at bedtime.    Dispense:  90 tablet    Refill:  3    Order Specific Question:   Supervising Provider    Answer:   Tresa Garter [[id number]]   metFORMIN (GLUCOPHAGE) 500 MG tablet    Sig: Take 2 tablets (1,000 mg total) by mouth 2 (two) times daily with a meal.    Dispense:  360 tablet    Refill:  3    Order Specific Question:   Supervising Provider    Answer:   Tresa Garter [[id number]]   benzonatate (TESSALON) 100 MG capsule    Sig: Take 1 capsule (100 mg total) by mouth 3 (three) times daily as needed for up to 10 days for cough. Never suck or chew on a benzonatate capsule.    Dispense:  30 capsule    Refill:  0    Do not place medication on "Automatic Refill". Please provide patient with medication counseling and recommendations.    Order Specific Question:   Supervising Provider    Answer:   Tresa Garter [[id number]]    Follow-up: Return in about 4 weeks (around 11/02/2020) for follow up [id number].    Vevelyn Francois, NP

## 2020-10-05 NOTE — Patient Instructions (Addendum)
Glucerna for diabetes  Diabetes Mellitus and Nutrition, Adult When you have diabetes, or diabetes mellitus, it is very important to have healthy eating habits because your blood sugar (glucose) levels are greatly affected by what you eat and drink. Eating healthy foods in the right amounts, at about the same times every day, can help you: Control your blood glucose. Lower your risk of heart disease. Improve your blood pressure. Reach or maintain a healthy weight. What can affect my meal plan? Every person with diabetes is different, and each person has different needs for a meal plan. Your health care provider may recommend that you work with a dietitian to make a meal plan that is best for you. Your meal plan may vary depending on factors such as: The calories you need. The medicines you take. Your weight. Your blood glucose, blood pressure, and cholesterol levels. Your activity level. Other health conditions you have, such as heart or kidney disease. How do carbohydrates affect me? Carbohydrates, also called carbs, affect your blood glucose level more than any other type of food. Eating carbs naturally raises the amount of glucose in your blood. Carb counting is a method for keeping track of how many carbs you eat. Counting carbs is important to keep your blood glucose at a healthy level, especially if you use insulin or take certain oral diabetes medicines. It is important to know how many carbs you can safely have in each meal. This is different for every person. Your dietitian can help you calculate how many carbs you should have at each meal and for each snack. How does alcohol affect me? Alcohol can cause a sudden decrease in blood glucose (hypoglycemia), especially if you use insulin or take certain oral diabetes medicines. Hypoglycemia can be a life-threatening condition. Symptoms of hypoglycemia, such as sleepiness, dizziness, and confusion, are similar to symptoms of having too much  alcohol. Do not drink alcohol if: Your health care provider tells you not to drink. You are pregnant, may be pregnant, or are planning to become pregnant. If you drink alcohol: Do not drink on an empty stomach. Limit how much you use to: 0-1 drink a day for women. 0-2 drinks a day for men. Be aware of how much alcohol is in your drink. In the [place] one drink equals one 12 oz bottle of beer (355 mL), one 5 oz glass of wine (148 mL), or one 1 oz glass of hard liquor (44 mL). Keep yourself hydrated with water, diet soda, or unsweetened iced tea. Keep in mind that regular soda, juice, and other mixers may contain a lot of sugar and must be counted as carbs. What are tips for following this plan? Reading food labels Start by checking the serving size on the "Nutrition Facts" label of packaged foods and drinks. The amount of calories, carbs, fats, and other nutrients listed on the label is based on one serving of the item. Many items contain more than one serving per package. Check the total grams (g) of carbs in one serving. You can calculate the number of servings of carbs in one serving by dividing the total carbs by 15. For example, if a food has 30 g of total carbs per serving, it would be equal to 2 servings of carbs. Check the number of grams (g) of saturated fats and trans fats in one serving. Choose foods that have a low amount or none of these fats. Check the number of milligrams (mg) of salt (sodium) in one serving.  Most people should limit total sodium intake to less than 2,300 mg per day. Always check the nutrition information of foods labeled as "low-fat" or "nonfat." These foods may be higher in added sugar or refined carbs and should be avoided. Talk to your dietitian to identify your daily goals for nutrients listed on the label. Shopping Avoid buying canned, pre-made, or processed foods. These foods tend to be high in fat, sodium, and added sugar. Shop around the outside edge of  the grocery store. This is where you will most often find fresh fruits and vegetables, bulk grains, fresh meats, and fresh dairy. Cooking Use low-heat cooking methods, such as baking, instead of high-heat cooking methods like deep frying. Cook using healthy oils, such as olive, canola, or sunflower oil. Avoid cooking with butter, cream, or high-fat meats. Meal planning Eat meals and snacks regularly, preferably at the same times every day. Avoid going long periods of time without eating. Eat foods that are high in fiber, such as fresh fruits, vegetables, beans, and whole grains. Talk with your dietitian about how many servings of carbs you can eat at each meal. Eat 4-6 oz (112-168 g) of lean protein each day, such as lean meat, chicken, fish, eggs, or tofu. One ounce (oz) of lean protein is equal to: 1 oz (28 g) of meat, chicken, or fish. 1 egg.  cup (62 g) of tofu. Eat some foods each day that contain healthy fats, such as avocado, nuts, seeds, and fish. What foods should I eat? Fruits Berries. Apples. Oranges. Peaches. Apricots. Plums. Grapes. Mango. Papaya. Pomegranate. Kiwi. Cherries. Vegetables Lettuce. Spinach. Leafy greens, including kale, chard, collard greens, and mustard greens. Beets. Cauliflower. Cabbage. Broccoli. Carrots. Green beans. Tomatoes. Peppers. Onions. Cucumbers. Brussels sprouts. Grains Whole grains, such as whole-wheat or whole-grain bread, crackers, tortillas, cereal, and pasta. Unsweetened oatmeal. Quinoa. Brown or wild rice. Meats and other proteins Seafood. Poultry without skin. Lean cuts of poultry and beef. Tofu. Nuts. Seeds. Dairy Low-fat or fat-free dairy products such as milk, yogurt, and cheese. The items listed above may not be a complete list of foods and beverages you can eat. Contact a dietitian for more information. What foods should I avoid? Fruits Fruits canned with syrup. Vegetables Canned vegetables. Frozen vegetables with butter or cream  sauce. Grains Refined white flour and flour products such as bread, pasta, snack foods, and cereals. Avoid all processed foods. Meats and other proteins Fatty cuts of meat. Poultry with skin. Breaded or fried meats. Processed meat. Avoid saturated fats. Dairy Full-fat yogurt, cheese, or milk. Beverages Sweetened drinks, such as soda or iced tea. The items listed above may not be a complete list of foods and beverages you should avoid. Contact a dietitian for more information. Questions to ask a health care provider Do I need to meet with a diabetes educator? Do I need to meet with a dietitian? What number can I call if I have questions? When are the best times to check my blood glucose? Where to find more information: American Diabetes Association: diabetes.org Academy of Nutrition and Dietetics: www.eatright.[employer] of Diabetes and Digestive and Kidney Diseases: [employer] Association of Diabetes Care and Education Specialists: www.diabeteseducator.org Summary It is important to have healthy eating habits because your blood sugar (glucose) levels are greatly affected by what you eat and drink. A healthy meal plan will help you control your blood glucose and maintain a healthy lifestyle. Your health care provider may recommend that you work with a dietitian to make  a meal plan that is best for you. Keep in mind that carbohydrates (carbs) and alcohol have immediate effects on your blood glucose levels. It is important to count carbs and to use alcohol carefully. This information is not intended to replace advice given to you by your health care provider. Make sure you discuss any questions you have with your health care provider. Document Revised: 12/16/2018 Document Reviewed: 12/16/2018 Elsevier Patient Education  2021 [employer].

## 2020-10-09 ENCOUNTER — Emergency Department (HOSPITAL_COMMUNITY)
Admission: EM | Admit: 2020-10-09 | Discharge: 2020-10-10 | Disposition: A | Discharge status: Home / Self Care | Payer: Medicare Other | Attending: Emergency Medicine | Admitting: Emergency Medicine

## 2020-10-09 ENCOUNTER — Emergency Department (HOSPITAL_COMMUNITY): Payer: Medicare Other

## 2020-10-09 ENCOUNTER — Other Ambulatory Visit: Payer: Self-pay

## 2020-10-09 ENCOUNTER — Encounter (HOSPITAL_COMMUNITY): Payer: Self-pay

## 2020-10-09 DIAGNOSIS — Z79899 Other long term (current) drug therapy: Secondary | ICD-10-CM | POA: Insufficient documentation

## 2020-10-09 DIAGNOSIS — I158 Other secondary hypertension: Secondary | ICD-10-CM

## 2020-10-09 DIAGNOSIS — Z955 Presence of coronary angioplasty implant and graft: Secondary | ICD-10-CM | POA: Insufficient documentation

## 2020-10-09 DIAGNOSIS — Z8616 Personal history of COVID-19: Secondary | ICD-10-CM | POA: Diagnosis not present

## 2020-10-09 DIAGNOSIS — Z7984 Long term (current) use of oral hypoglycemic drugs: Secondary | ICD-10-CM | POA: Insufficient documentation

## 2020-10-09 DIAGNOSIS — Z7982 Long term (current) use of aspirin: Secondary | ICD-10-CM | POA: Insufficient documentation

## 2020-10-09 DIAGNOSIS — I10 Essential (primary) hypertension: Secondary | ICD-10-CM | POA: Diagnosis not present

## 2020-10-09 DIAGNOSIS — E119 Type 2 diabetes mellitus without complications: Secondary | ICD-10-CM | POA: Diagnosis not present

## 2020-10-09 DIAGNOSIS — R519 Headache, unspecified: Secondary | ICD-10-CM | POA: Diagnosis present

## 2020-10-09 LAB — CBC WITH DIFFERENTIAL/PLATELET
Abs Immature Granulocytes: 0.03 10*3/uL (ref 0.00–0.07)
Basophils Absolute: 0 10*3/uL (ref 0.0–0.1)
Basophils Relative: 1 %
Eosinophils Absolute: 0.1 10*3/uL (ref 0.0–0.5)
Eosinophils Relative: 1 %
HCT: 36 % (ref 36.0–46.0)
Hemoglobin: 11.2 g/dL — ABNORMAL LOW (ref 12.0–15.0)
Immature Granulocytes: 0 %
Lymphocytes Relative: 21 %
Lymphs Abs: 1.5 10*3/uL (ref 0.7–4.0)
MCH: 29.6 pg (ref 26.0–34.0)
MCHC: 31.1 g/dL (ref 30.0–36.0)
MCV: 95.2 fL (ref 80.0–100.0)
Monocytes Absolute: 0.5 10*3/uL (ref 0.1–1.0)
Monocytes Relative: 7 %
Neutro Abs: 5.3 10*3/uL (ref 1.7–7.7)
Neutrophils Relative %: 70 %
Platelets: 316 10*3/uL (ref 150–400)
RBC: 3.78 MIL/uL — ABNORMAL LOW (ref 3.87–5.11)
RDW: 12.3 % (ref 11.5–15.5)
WBC: 7.4 10*3/uL (ref 4.0–10.5)
nRBC: 0 % (ref 0.0–0.2)

## 2020-10-09 LAB — COMPREHENSIVE METABOLIC PANEL
ALT: 14 U/L (ref 0–44)
AST: 17 U/L (ref 15–41)
Albumin: 3.7 g/dL (ref 3.5–5.0)
Alkaline Phosphatase: 81 U/L (ref 38–126)
Anion gap: 9 (ref 5–15)
BUN: 15 mg/dL (ref 8–23)
CO2: 27 mmol/L (ref 22–32)
Calcium: 9.1 mg/dL (ref 8.9–10.3)
Chloride: 102 mmol/L (ref 98–111)
Creatinine, Ser: 1.11 mg/dL — ABNORMAL HIGH (ref 0.44–1.00)
GFR, Estimated: 52 mL/min — ABNORMAL LOW (ref >60)
Glucose, Bld: 119 mg/dL — ABNORMAL HIGH (ref 70–99)
Potassium: 3.7 mmol/L (ref 3.5–5.1)
Sodium: 138 mmol/L (ref 135–145)
Total Bilirubin: 0.5 mg/dL (ref 0.3–1.2)
Total Protein: 7.9 g/dL (ref 6.5–8.1)

## 2020-10-09 LAB — TROPONIN I (HIGH SENSITIVITY)
Troponin I (High Sensitivity): 6 ng/L (ref <18)
Troponin I (High Sensitivity): 6 ng/L (ref <18)

## 2020-10-09 NOTE — ED Triage Notes (Signed)
Pt reports that she has had a headache for a week and her feet hurt from walking

## 2020-10-09 NOTE — ED Provider Notes (Signed)
Emergency Medicine Provider Triage Evaluation Note  [name] , a 75 y.o. female  was evaluated in triage.  Pt complains of headache all day each day for 1 week.  Also states that she has some blurry vision. She denies any chest pain at this moment but states that she has over the course of the day had pressure in her chest she denies shortness of breath nausea or diaphoresis.  She denies any slurred speech confusion or weakness or numbness of any extremity  Review of Systems  Positive: Headache, chest pain Negative: Fever  Physical Exam  BP (!) 220/76 (BP Location: Right Arm)   Pulse 70   Temp 98.6 F (37 C) (Oral)   Resp 16   SpO2 96%  Gen:   Awake, no distress   Resp:  Normal effort  MSK:   Moves extremities without difficulty  Other:  Moves all 4 extremities.  Sensation intact in all 4 extremities, smile symmetric  Medical Decision Making  Medically screening exam initiated at 8:46 PM.  Appropriate orders placed.  [name] was informed that the remainder of the evaluation will be completed by another provider, this initial triage assessment does not replace that evaluation, and the importance of remaining in the ED until their evaluation is complete.  Patient very hypertensive with headache has similar presentation 09/27/2020  Given age and symptoms will go ahead and obtain CT head, basic labs, EKG, troponin given her chest pain  Also complains of some leg and foot pain after walking today quite low suspicion for acute condition such as rhabdomyolysis or similar.   [name], [place] 10/09/20 2049    Terrilee Files, MD 10/10/20 1013

## 2020-10-10 DIAGNOSIS — I158 Other secondary hypertension: Secondary | ICD-10-CM | POA: Diagnosis not present

## 2020-10-10 MED ORDER — LOSARTAN POTASSIUM 50 MG PO TABS
50.0000 mg | ORAL_TABLET | Freq: Once | ORAL | Status: AC
  Administered 2020-10-10: 50 mg via ORAL
  Filled 2020-10-10: qty 1

## 2020-10-10 MED ORDER — ACETAMINOPHEN 500 MG PO TABS
1000.0000 mg | ORAL_TABLET | Freq: Once | ORAL | Status: AC
  Administered 2020-10-10: 1000 mg via ORAL
  Filled 2020-10-10: qty 2

## 2020-10-10 MED ORDER — CHLORTHALIDONE 25 MG PO TABS
25.0000 mg | ORAL_TABLET | Freq: Every day | ORAL | Status: DC
  Administered 2020-10-10: 25 mg via ORAL
  Filled 2020-10-10 (×2): qty 1

## 2020-10-10 NOTE — ED Notes (Signed)
Per EDP-allow pt to remain in room until 0630ish as pt has no transportation home and no cab voucher available

## 2020-10-10 NOTE — ED Provider Notes (Signed)
[facility] EMERGENCY DEPARTMENT Provider Note  CSN: [id number] Arrival date & time: 10/09/20 2020  Chief Complaint(s) Headache and Hypertension  HPI [name] is a 75 y.o. female with a past medical history listed below including hypertension.  She presents for 1 week of intermittent headache related to elevated blood pressures.  She reports that she is compliant with her medications.  Headache is a aching/throbbing pain.  Generalized.  Endorses intermittent blurry vision.  No focal deficits.  No nausea or vomiting.  No chest pain or shortness of breath.  No abdominal pain.  No fevers or chills.  No coughing or congestion.   During the MSE process, she reported having chest pressure earlier in the day. She tells me that she doesn't remember having pain.  HPI  Past Medical History Past Medical History:  Diagnosis Date   Diabetes mellitus without complication (HCC)    GERD 09/04/2006   Qualifier: Diagnosis of  By: Duke Salvia     History of echocardiogram    a. 2D ECHO: 11/06/2013 EF 65%; no WMA. Mild TR. PA pk pressure 43 mm HG   Hypertension    Stroke 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 Authorizing Provider  lidocaine (LIDODERM) 5 % Place 1 patch onto the skin daily. Remove & Discard patch within 12 hours or as directed by MD   Yes [provider]  mirabegron ER (MYRBETRIQ) 25 MG TB24 tablet Take 25 mg by mouth daily.   Yes [provider]  potassium chloride (KLOR-CON) 10 MEQ tablet Take 10 mEq by mouth daily.   Yes [provider]  albuterol (VENTOLIN HFA) 108 (90 Base) MCG/ACT inhaler Inhale 1-2 puffs into the lungs every 6 (six) hours as needed for wheezing or shortness of breath. 04/22/20   Barbette Merino, NP  aspirin EC 81 MG tablet Take 81 mg by mouth daily. Swallow whole.    [provider]  benzonatate (TESSALON) 100 MG capsule Take 1 capsule (100 mg total) by mouth 3 (three) times daily as needed for up to 10 days for cough. Never suck or chew on a benzonatate capsule. 10/05/20 10/15/20  Barbette Merino, NP  chlorthalidone (HYGROTON) 25 MG tablet Take 1 tablet (25 mg total) by mouth daily. 10/05/20 10/05/21  Barbette Merino, NP  clopidogrel (PLAVIX) 75 MG tablet Take 1 tablet (75 mg total) by mouth daily. 04/22/20   Barbette Merino, NP  diclofenac Sodium (VOLTAREN) 1 % GEL Apply 2 g topically 4 (four) times daily as needed. 07/27/20   Tilden Fossa, MD  diphenhydramine-acetaminophen (TYLENOL PM) 25-500 MG TABS tablet Take 1 tablet by mouth at bedtime.    [provider]  gabapentin (  NEURONTIN) 300 MG capsule Take 1 capsule (300 mg total) by mouth 3 (three) times daily. 10/05/20   Barbette Merino, NP  gatifloxacin (ZYMAXID) 0.5 % SOLN Place 1 drop into the right eye 4 (four) times daily. 06/28/20   [provider]  glipiZIDE (GLUCOTROL) 5 MG tablet Take 1 tablet (5 mg total) by mouth 2 (two) times daily before a meal. 04/22/20 04/22/21  Barbette Merino, NP  hydrALAZINE (APRESOLINE) 50 MG tablet Take 1 tablet (50 mg total) by mouth 2 (two) times daily. 04/22/20   Barbette Merino, NP   ketorolac (ACULAR) 0.5 % ophthalmic solution Place 1 drop into the right eye 4 (four) times daily. 06/28/20   [provider]  losartan (COZAAR) 50 MG tablet Take 1 tablet (50 mg total) by mouth daily. 10/05/20 10/05/21  Barbette Merino, NP  metFORMIN (GLUCOPHAGE) 500 MG tablet Take 2 tablets (1,000 mg total) by mouth 2 (two) times daily with a meal. 10/05/20 10/05/21  Barbette Merino, NP  [facility] VERIO test strip Use 1-4 times daily as directed/needed   DX E11.9 Patient not taking: No sig reported 04/25/20   Barbette Merino, NP  prednisoLONE acetate (PRED FORTE) 1 % ophthalmic suspension Place 1 drop into the right eye 4 (four) times daily. 06/28/20   [provider]  rosuvastatin (CRESTOR) 10 MG tablet Take 1 tablet (10 mg total) by mouth at bedtime. 10/05/20 10/05/21  Barbette Merino, NP                                                                                                                                    Past Surgical History Past Surgical History:  Procedure Laterality Date   ABDOMINAL HYSTERECTOMY     ANTERIOR CERVICAL DECOMPRESSION/DISCECTOMY FUSION 4 LEVELS N/A 11/11/2019   Procedure: Cervical three-four Cervical four-five Cervical five-six Cervical six-seven  Anterior cervical decompression/discectomy/fusion;  Surgeon: Maeola Harman, MD;  Location: [facility] OR;  Service: Neurosurgery;  Laterality: N/A;   LEFT HEART CATHETERIZATION WITH CORONARY ANGIOGRAM N/A 11/05/2013   Procedure: LEFT HEART CATHETERIZATION WITH CORONARY ANGIOGRAM;  Surgeon: Lennette Bihari, MD;  Location: [facility] CATH LAB;  Service: Cardiovascular;  Laterality: N/A;   Family History No family history on file.  Social History Social History   Tobacco Use   Smoking status: Never   Smokeless tobacco: Never  Vaping Use   Vaping Use: Never used  Substance Use Topics   Alcohol use: No   Drug use: No   Allergies Codeine, Ibuprofen, and Imdur [isosorbide nitrate]  Review of Systems Review of  Systems All other systems are reviewed and are negative for acute change except as noted in the HPI  Physical Exam Vital Signs  I have reviewed the triage vital signs BP (!) 188/74   Pulse 71   Temp 98.6 F (37 C) (Oral)   Resp 16   SpO2 100%   Physical Exam Neurological:  Comments: Mental Status:  Alert and oriented to person, place, and time.  Attention and concentration normal.  Speech clear.  Recent memory is intact  Cranial Nerves:  II Visual Fields: Intact to confrontation. Visual fields intact. III, IV, VI: Pupils equal and reactive to light and near. Full eye movement without nystagmus  V Facial Sensation: Normal. No weakness of masticatory muscles  VII: No facial weakness or asymmetry  VIII Auditory Acuity: Grossly normal  IX/X: The uvula is midline; the palate elevates symmetrically  XI: Normal sternocleidomastoid and trapezius strength  XII: The tongue is midline. No atrophy or fasciculations.   Motor System: Muscle Strength: 5/5 and symmetric in the upper and lower extremities. No pronation or drift.  Muscle Tone: Tone and muscle bulk are normal in the upper and lower extremities.  Reflexes: DTRs: 1+ and symmetrical in all four extremities. No Clonus Coordination: Intact finger-to-nose, heel-to-shin. No tremor.  Sensation: Intact to light touch, and pinprick. Negative Romberg test.  Gait: Routine and tandem gait normal.     ED Results and Treatments Labs (all labs ordered are listed, but only abnormal results are displayed) Labs Reviewed  CBC WITH DIFFERENTIAL/PLATELET - Abnormal; Notable for the following components:      Result Value   RBC 3.78 (*)    Hemoglobin 11.2 (*)    All other components within normal limits  COMPREHENSIVE METABOLIC PANEL - Abnormal; Notable for the following components:   Glucose, Bld 119 (*)    Creatinine, Ser 1.11 (*)    GFR, Estimated 52 (*)    All other components within normal limits  TROPONIN I (HIGH SENSITIVITY)   TROPONIN I (HIGH SENSITIVITY)                                                                                                                         EKG  EKG Interpretation  Date/Time:  Sunday October 09 2020 20:58:07 EDT Ventricular Rate:  69 PR Interval:  112 QRS Duration: 66 QT Interval:  398 QTC Calculation: 426 R Axis:   35 Text Interpretation: Normal sinus rhythm Normal ECG No acute changes Confirmed by Drema Pry [phone]) on 10/10/2020 12:31:27 AM       Radiology CT HEAD WO CONTRAST ( )  Result Date: 10/09/2020 CLINICAL DATA:  Headache, new or worsening.  Headache for a week. EXAM: CT HEAD WITHOUT CONTRAST TECHNIQUE: Contiguous axial images were obtained from the base of the skull through the vertex without intravenous contrast. COMPARISON:  10/04/2020 FINDINGS: Brain: Focal low-attenuation change in the left thalamus is similar to prior study, likely representing small vessel ischemic change. Ventricles and sulci are otherwise symmetrical. No mass effect or midline shift. No abnormal extra-axial fluid collections. Gray-white matter junctions are distinct. Basal cisterns are not effaced. No acute intracranial hemorrhage. Vascular: Mild intracranial arterial vascular calcifications. Skull: Calvarium appears intact. Sinuses/Orbits: Paranasal sinuses and mastoid air cells are clear. Other: None. IMPRESSION: No acute intracranial abnormalities. 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 Electronically Signed   By: Burman Nieves M.D.   On: 10/09/2020 23:54    Pertinent labs & imaging results that were available during my care of the patient were reviewed by me and considered in my medical decision making (see MDM for details).  Medications Ordered in ED Medications  chlorthalidone (HYGROTON) tablet 25 mg (25 mg Oral Given 10/10/20 0256)  acetaminophen (TYLENOL) tablet 1,000 mg (1,000 mg Oral Given 10/10/20 0123)  losartan  (COZAAR) tablet 50 mg (50 mg Oral Given 10/10/20 0123)                                                                                                                                     Procedures Procedures  (including critical care time)  Medical Decision Making / ED Course I have reviewed the nursing notes for this encounter and the patient's prior records (if available in EHR or on provided paperwork).  Alyah Boehning was evaluated in Emergency Department on 10/10/2020 for the symptoms described in the history of present illness. She was evaluated in the context of the global COVID-19 pandemic, which necessitated consideration that the patient might be at risk for infection with the SARS-CoV-2 virus that causes COVID-19. Institutional protocols and algorithms that pertain to the evaluation of patients at risk for COVID-19 are in a state of rapid change based on information released by regulatory bodies including the CDC and federal and state organizations. These policies and algorithms were followed during the patient's care in the ED.    Seen in MSE Headache related to HTN No focal deficits. CT head negative. Headache resolved after Tylenol. She was given her home blood pressure medication. Labs without evidence of endorgan damage.  Patient mentioned having chest pain earlier today but did not remember during my evaluation. EKG without acute ischemic changes.  Troponin negative.  Pertinent labs & imaging results that were available during my care of the patient were reviewed by me and considered in my medical decision making:  Patient is oriented however she noted to repeat questions.  I called the patient's daughter who confirmed that the patient's memory has been off for "a long time."  Daughter reports that she gets called from the patient several times throughout the day asking the same question.  The daughter does not know whether the patient is taking her home medication stating  that she works too much to check up on her.  She did report that when the patient's granddaughter stays with her she reminds the patient to take her medicines.  I placed a consult to transitional care team to help establish home health to assist patient with medication management. Recommend patient follow-up with neurologist for evaluation of possible early dementia. I will reach out to the patient's PCP to help coordinate this evaluation.    Final Clinical Impression(s) / ED Diagnoses Final diagnoses:  None   The patient  appears reasonably screened and/or stabilized for discharge and I doubt any other medical condition or other [facility] requiring further screening, evaluation, or treatment in the ED at this time prior to discharge. Safe for discharge with strict return precautions.  Disposition: Discharge  Condition: Good  I have discussed the results, Dx and Tx plan with the patient/family who expressed understanding and agree(s) with the plan. Discharge instructions discussed at length. The patient/family was given strict return precautions who verbalized understanding of the instructions. No further questions at time of discharge.    ED Discharge Orders          Ordered    Ambulatory referral to Neurology       Comments: An appointment is requested in approximately: 2 weeks   10/10/20 0451             Follow Up: Barbette Merino, NP [address] [place] [address] [phone]  Call       This chart was dictated using voice recognition software.  Despite best efforts to proofread,  errors can occur which can change the documentation meaning.    Nira Conn, MD 10/10/20 [phone]

## 2020-10-14 ENCOUNTER — Ambulatory Visit (INDEPENDENT_AMBULATORY_CARE_PROVIDER_SITE_OTHER): Payer: Medicare Other

## 2020-10-14 DIAGNOSIS — Z0000 Encounter for general adult medical examination without abnormal findings: Secondary | ICD-10-CM | POA: Diagnosis not present

## 2020-10-14 NOTE — Progress Notes (Signed)
Subjective:   [name] is a 75 y.o. female who presents for an Initial Medicare Annual Wellness Visit.I connected with  [name] on 10/14/20 by a audio enabled telemedicine application and verified that I am speaking with the correct person using two identifiers.   I discussed the limitations of evaluation and management by telemedicine. The patient expressed understanding and agreed to proceed.   Location of patient: Home Location of provider: Office  Persons participating in visit: [name] (patient) and Donnetta Hail, CMA  Review of Systems    Defer to PCP       Objective:    Today's Vitals   10/14/20 1801  PainSc: 5    There is no height or weight on file to calculate BMI.  Advanced Directives 10/14/2020 09/27/2020 09/19/2020 09/12/2020 09/01/2020 09/01/2020 08/29/2020  Does Patient Have a Medical Advance Directive? No No No No No No No  Would patient like information on creating a medical advance directive? - - No - Patient declined - No - Patient declined No - Patient declined -  Pre-existing out of facility DNR order (yellow form or pink MOST form) - - - - - - -    Current Medications (verified) Outpatient Encounter Medications as of 10/14/2020  Medication Sig   albuterol (VENTOLIN HFA) 108 (90 Base) MCG/ACT inhaler Inhale 1-2 puffs into the lungs every 6 (six) hours as needed for wheezing or shortness of breath.   aspirin EC 81 MG tablet Take 81 mg by mouth daily. Swallow whole.   benzonatate (TESSALON) 100 MG capsule Take 1 capsule (100 mg total) by mouth 3 (three) times daily as needed for up to 10 days for cough. Never suck or chew on a benzonatate capsule.   chlorthalidone (HYGROTON) 25 MG tablet Take 1 tablet (25 mg total) by mouth daily.   clopidogrel (PLAVIX) 75 MG tablet Take 1 tablet (75 mg total) by mouth daily.   diclofenac Sodium (VOLTAREN) 1 % GEL Apply 2 g topically 4 (four) times daily as needed.   diphenhydramine-acetaminophen (TYLENOL PM) 25-500 MG  TABS tablet Take 1 tablet by mouth at bedtime.   gabapentin (NEURONTIN) 300 MG capsule Take 1 capsule (300 mg total) by mouth 3 (three) times daily.   gatifloxacin (ZYMAXID) 0.5 % SOLN Place 1 drop into the right eye 4 (four) times daily.   glipiZIDE (GLUCOTROL) 5 MG tablet Take 1 tablet (5 mg total) by mouth 2 (two) times daily before a meal.   hydrALAZINE (APRESOLINE) 50 MG tablet Take 1 tablet (50 mg total) by mouth 2 (two) times daily.   ketorolac (ACULAR) 0.5 % ophthalmic solution Place 1 drop into the right eye 4 (four) times daily.   lidocaine (LIDODERM) 5 % Place 1 patch onto the skin daily. Remove & Discard patch within 12 hours or as directed by MD   losartan (COZAAR) 50 MG tablet Take 1 tablet (50 mg total) by mouth daily.   metFORMIN (GLUCOPHAGE) 500 MG tablet Take 2 tablets (1,000 mg total) by mouth 2 (two) times daily with a meal.   mirabegron ER (MYRBETRIQ) 25 MG TB24 tablet Take 25 mg by mouth daily.   potassium chloride (KLOR-CON) 10 MEQ tablet Take 10 mEq by mouth daily.   prednisoLONE acetate (PRED FORTE) 1 % ophthalmic suspension Place 1 drop into the right eye 4 (four) times daily.   rosuvastatin (CRESTOR) 10 MG tablet Take 1 tablet (10 mg total) by mouth at bedtime.   ONETOUCH VERIO test strip Use 1-4 times  daily as directed/needed   DX E11.9 (Patient not taking: No sig reported)   No facility-administered encounter medications on file as of 10/14/2020.    Allergies (verified) Codeine, Ibuprofen, and Imdur [isosorbide nitrate]   History: Past Medical History:  Diagnosis Date   Diabetes mellitus without complication (HCC)    GERD 09/04/2006   Qualifier: Diagnosis of  By: Duke Salvia     History of echocardiogram    a. 2D ECHO: 11/06/2013 EF 65%; no WMA. Mild TR. PA pk pressure 43 mm HG   Hypertension    Stroke [facility])    Past Surgical History:  Procedure Laterality Date   ABDOMINAL HYSTERECTOMY     ANTERIOR CERVICAL DECOMPRESSION/DISCECTOMY FUSION 4 LEVELS N/A  11/11/2019   Procedure: Cervical three-four Cervical four-five Cervical five-six Cervical six-seven  Anterior cervical decompression/discectomy/fusion;  Surgeon: Maeola Harman, MD;  Location: [facility] OR;  Service: Neurosurgery;  Laterality: N/A;   LEFT HEART CATHETERIZATION WITH CORONARY ANGIOGRAM N/A 11/05/2013   Procedure: LEFT HEART CATHETERIZATION WITH CORONARY ANGIOGRAM;  Surgeon: Lennette Bihari, MD;  Location: [facility] CATH LAB;  Service: Cardiovascular;  Laterality: N/A;   History reviewed. No pertinent family history. Social History   Socioeconomic History   Marital status: Widowed    Spouse name: Not on file   Number of children: Not on file   Years of education: Not on file   Highest education level: Not on file  Occupational History   Not on file  Tobacco Use   Smoking status: Never   Smokeless tobacco: Never  Vaping Use   Vaping Use: Never used  Substance and Sexual Activity   Alcohol use: No   Drug use: No   Sexual activity: Not Currently  Other Topics Concern   Not on file  Social History Narrative   Not on file   Social Determinants of Health   Financial Resource Strain: Low Risk    Difficulty of Paying Living Expenses: Not hard at all  Food Insecurity: No Food Insecurity   Worried About Running Out of Food in the Last Year: Never true   Ran Out of Food in the Last Year: Never true  Transportation Needs: No Transportation Needs   Lack of Transportation (Medical): No   Lack of Transportation (Non-Medical): No  Physical Activity: Insufficiently Active   Days of Exercise per Week: 2 days   Minutes of Exercise per Session: 60 min  Stress: No Stress Concern Present   Feeling of Stress : Not at all  Social Connections: Socially Isolated   Frequency of Communication with Friends and Family: Three times a week   Frequency of Social Gatherings with Friends and Family: Never   Attends Religious Services: Never   Database administrator or Organizations: No   Attends Tax inspector Meetings: Never   Marital Status: Widowed    Tobacco Counseling Counseling given: Not Answered   Clinical Intake:  Pre-visit preparation completed: Yes  Pain : 0-10 Pain Score: 5  Pain Location: Head Pain Onset: Today Pain Frequency: Constant     Diabetes: Yes CBG done?: No Did pt. bring in CBG monitor from home?: No  How often do you need to have someone help you when you read instructions, pamphlets, or other written materials from your doctor or pharmacy?: 4 - Often What is the last grade level you completed in school?: 8th Grade  Diabetic?Yes  Interpreter Needed?: No  Information entered by :: Donnetta Hail, CMA   Activities of Daily Living In your  present state of health, do you have any difficulty performing the following activities: 10/14/2020 11/24/2019  Hearing? N N  Vision? Y N  Difficulty concentrating or making decisions? N N  Walking or climbing stairs? Y Y  Dressing or bathing? N Y  Doing errands, shopping? N Y  Quarry manager and eating ? N -  Using the Toilet? N -  In the past six months, have you accidently leaked urine? N -  Do you have problems with loss of bowel control? N -  Managing your Medications? N -  Managing your Finances? N -  Housekeeping or managing your Housekeeping? N -  Some recent data might be hidden    Patient Care Team: Pcp, No as PCP - General  Indicate any recent Medical Services you may have received from other than [facility] providers in the past year (date may be approximate).     Assessment:   This is a routine wellness examination for Necha.  Hearing/Vision screen No results found.  Dietary issues and exercise activities discussed:     Goals Addressed   None    Depression Screen PHQ 2/9 Scores 10/14/2020 10/05/2020  PHQ - 2 Score 0 0    Fall Risk Fall Risk  10/14/2020 10/05/2020  Falls in the past year? 1 1  Number falls in past yr: 0 0  Injury with Fall? 1 1  Follow up Falls evaluation  completed -    FALL RISK PREVENTION PERTAINING TO THE HOME:  Any stairs in or around the home? No  If so, are there any without handrails?  N/A Home free of loose throw rugs in walkways, pet beds, electrical cords, etc? Yes  Adequate lighting in your home to reduce risk of falls? Yes   ASSISTIVE DEVICES UTILIZED TO PREVENT FALLS:  Life alert? No  Use of a cane, walker or w/c? No  Grab bars in the bathroom? No  Shower chair or bench in shower? Yes  Elevated toilet seat or a handicapped toilet? No   TIMED UP AND GO:  Was the test performed?  N/A .  Length of time to ambulate 10 feet: N/A sec.     Cognitive Function:     6CIT Screen 10/14/2020  What Year? 4 points  What month? 3 points  What time? 0 points  Count back from 20 2 points  Months in reverse 4 points  Repeat phrase 10 points  Total Score 23    Immunizations Immunization History  Administered Date(s) Administered   Fluad Quad(high Dose 65+) 11/21/2019   Influenza,inj,Quad PF,6+ Mos 10/19/2012   Pneumococcal Polysaccharide-23 08/10/2004, 11/06/2013    TDAP status: Due, Education has been provided regarding the importance of this vaccine. Advised may receive this vaccine at local pharmacy or Health Dept. Aware to provide a copy of the vaccination record if obtained from local pharmacy or Health Dept. Verbalized acceptance and understanding.  Flu Vaccine status: Due, Education has been provided regarding the importance of this vaccine. Advised may receive this vaccine at local pharmacy or Health Dept. Aware to provide a copy of the vaccination record if obtained from local pharmacy or Health Dept. Verbalized acceptance and understanding.   Covid-19 vaccine status: Information provided on how to obtain vaccines.   Qualifies for Shingles Vaccine? Yes   Zostavax completed No   Shingrix Completed?: No.    Education has been provided regarding the importance of this vaccine. Patient has been advised to call  insurance company to determine out of pocket expense  if they have not yet received this vaccine. Advised may also receive vaccine at local pharmacy or Health Dept. Verbalized acceptance and understanding.  Screening Tests Health Maintenance  Topic Date Due   OPHTHALMOLOGY EXAM  Never done   COVID-19 Vaccine (1) 10/21/2020 (Originally 07/02/1946)   Zoster Vaccines- Shingrix (1 of 2) 01/04/2021 (Originally 12/31/1996)   INFLUENZA VACCINE  04/21/2021 (Originally 08/22/2020)   MAMMOGRAM  04/26/2021 (Originally 09/01/2016)   COLONOSCOPY (Pts 45-71yrs Insurance coverage will need to be confirmed)  04/26/2021 (Originally 01/01/1992)   TETANUS/TDAP  04/26/2021 (Originally 12/31/1965)   Hepatitis C Screening  04/26/2021 (Originally 12/31/1964)   HEMOGLOBIN A1C  04/04/2021   FOOT EXAM  04/22/2021   DEXA SCAN  Completed   HPV VACCINES  Aged Out    Health Maintenance  Health Maintenance Due  Topic Date Due   OPHTHALMOLOGY EXAM  Never done    Colorectal cancer screening: Referral to GI placed Postponed 04/2021. Pt aware the office will call re: appt.  Mammogram status: Completed 09/02/2014. Repeat every year  Bone Density status: Completed 04/16/2007. Results reflect: Bone density results: NORMAL. Repeat every 04/16/2007 years.  Lung Cancer Screening: (Low Dose CT Chest recommended if Age 58-80 years, 30 pack-year currently smoking OR have quit w/in 15years.) does not qualify.   Lung Cancer Screening Referral: No  Additional Screening:  Hepatitis C Screening: does qualify; Completed Postponed 04/2021.  Vision Screening: Recommended annual ophthalmology exams for early detection of glaucoma and other disorders of the eye. Is the patient up to date with their annual eye exam?  No  Who is the provider or what is the name of the office in which the patient attends annual eye exams? N/A If pt is not established with a provider, would they like to be referred to a provider to establish care? No .    Dental Screening: Recommended annual dental exams for proper oral hygiene  Community Resource Referral / Chronic Care Management: CRR required this visit?  No   CCM required this visit?  No      Plan:     I have personally reviewed and noted the following in the patient's chart:   Medical and social history Use of alcohol, tobacco or illicit drugs  Current medications and supplements including opioid prescriptions. Patient is not currently taking opioid prescriptions. Functional ability and status Nutritional status Physical activity Advanced directives List of other physicians Hospitalizations, surgeries, and ER visits in previous 12 months Vitals Screenings to include cognitive, depression, and falls Referrals and appointments  In addition, I have reviewed and discussed with patient certain preventive protocols, quality metrics, and best practice recommendations. A written personalized care plan for preventive services as well as general preventive health recommendations were provided to patient.     Donnetta Hail, [facility]   10/14/2020   Nurse Notes: Non-Face to Face 60 minute visit Encounter.  Ms. Castagnola , Thank you for taking time to come for your Medicare Wellness Visit. I appreciate your ongoing commitment to your health goals. Please review the following plan we discussed and let me know if I can assist you in the future.   These are the goals we discussed:  Goals   None     This is a list of the screening recommended for you and due dates:  Health Maintenance  Topic Date Due   Eye exam for diabetics  Never done   COVID-19 Vaccine (1) 10/21/2020*   Zoster (Shingles) Vaccine (1 of 2) 01/04/2021*   Flu Shot  04/21/2021*   Mammogram  04/26/2021*   Colon Cancer Screening  04/26/2021*   Tetanus Vaccine  04/26/2021*   Hepatitis C Screening: USPSTF Recommendation to screen - Ages 18-79 yo.  04/26/2021*   Hemoglobin A1C  04/04/2021   Complete foot exam    04/22/2021   DEXA scan (bone density measurement)  Completed   HPV Vaccine  Aged Out  *Topic was postponed. The date shown is not the original due date.

## 2020-10-14 NOTE — Patient Instructions (Signed)
Health Maintenance, Female Adopting a healthy lifestyle and getting preventive care are important in promoting health and wellness. Ask your health care provider about: The right schedule for you to have regular tests and exams. Things you can do on your own to prevent diseases and keep yourself healthy. What should I know about diet, weight, and exercise? Eat a healthy diet  Eat a diet that includes plenty of vegetables, fruits, low-fat dairy products, and lean protein. Do not eat a lot of foods that are high in solid fats, added sugars, or sodium. Maintain a healthy weight Body mass index (BMI) is used to identify weight problems. It estimates body fat based on height and weight. Your health care provider can help determine your BMI and help you achieve or maintain a healthy weight. Get regular exercise Get regular exercise. This is one of the most important things you can do for your health. Most adults should: Exercise for at least 150 minutes each week. The exercise should increase your heart rate and make you sweat (moderate-intensity exercise). Do strengthening exercises at least twice a week. This is in addition to the moderate-intensity exercise. Spend less time sitting. Even light physical activity can be beneficial. Watch cholesterol and blood lipids Have your blood tested for lipids and cholesterol at 74 years of age, then have this test every 5 years. Have your cholesterol levels checked more often if: Your lipid or cholesterol levels are high. You are older than 74 years of age. You are at high risk for heart disease. What should I know about cancer screening? Depending on your health history and family history, you may need to have cancer screening at various ages. This may include screening for: Breast cancer. Cervical cancer. Colorectal cancer. Skin cancer. Lung cancer. What should I know about heart disease, diabetes, and high blood pressure? Blood pressure and heart  disease High blood pressure causes heart disease and increases the risk of stroke. This is more likely to develop in people who have high blood pressure readings, are of African descent, or are overweight. Have your blood pressure checked: Every 3-5 years if you are 18-39 years of age. Every year if you are 40 years old or older. Diabetes Have regular diabetes screenings. This checks your fasting blood sugar level. Have the screening done: Once every three years after age 40 if you are at a normal weight and have a low risk for diabetes. More often and at a younger age if you are overweight or have a high risk for diabetes. What should I know about preventing infection? Hepatitis B If you have a higher risk for hepatitis B, you should be screened for this virus. Talk with your health care provider to find out if you are at risk for hepatitis B infection. Hepatitis C Testing is recommended for: Everyone born from 1945 through 1965. Anyone with known risk factors for hepatitis C. Sexually transmitted infections (STIs) Get screened for STIs, including gonorrhea and chlamydia, if: You are sexually active and are younger than 74 years of age. You are older than 74 years of age and your health care provider tells you that you are at risk for this type of infection. Your sexual activity has changed since you were last screened, and you are at increased risk for chlamydia or gonorrhea. Ask your health care provider if you are at risk. Ask your health care provider about whether you are at high risk for HIV. Your health care provider may recommend a prescription medicine   to help prevent HIV infection. If you choose to take medicine to prevent HIV, you should first get tested for HIV. You should then be tested every 3 months for as long as you are taking the medicine. Pregnancy If you are about to stop having your period (premenopausal) and you may become pregnant, seek counseling before you get  pregnant. Take 400 to 800 micrograms (mcg) of folic acid every day if you become pregnant. Ask for birth control (contraception) if you want to prevent pregnancy. Osteoporosis and menopause Osteoporosis is a disease in which the bones lose minerals and strength with aging. This can result in bone fractures. If you are 65 years old or older, or if you are at risk for osteoporosis and fractures, ask your health care provider if you should: Be screened for bone loss. Take a calcium or vitamin D supplement to lower your risk of fractures. Be given hormone replacement therapy (HRT) to treat symptoms of menopause. Follow these instructions at home: Lifestyle Do not use any products that contain nicotine or tobacco, such as cigarettes, e-cigarettes, and chewing tobacco. If you need help quitting, ask your health care provider. Do not use street drugs. Do not share needles. Ask your health care provider for help if you need support or information about quitting drugs. Alcohol use Do not drink alcohol if: Your health care provider tells you not to drink. You are pregnant, may be pregnant, or are planning to become pregnant. If you drink alcohol: Limit how much you use to 0-1 drink a day. Limit intake if you are breastfeeding. Be aware of how much alcohol is in your drink. In the [place] one drink equals one 12 oz bottle of beer (355 mL), one 5 oz glass of wine (148 mL), or one 1 oz glass of hard liquor (44 mL). General instructions Schedule regular health, dental, and eye exams. Stay current with your vaccines. Tell your health care provider if: You often feel depressed. You have ever been abused or do not feel safe at home. Summary Adopting a healthy lifestyle and getting preventive care are important in promoting health and wellness. Follow your health care provider's instructions about healthy diet, exercising, and getting tested or screened for diseases. Follow your health care provider's  instructions on monitoring your cholesterol and blood pressure. This information is not intended to replace advice given to you by your health care provider. Make sure you discuss any questions you have with your health care provider. Document Revised: 03/18/2020 Document Reviewed: 01/01/2018 Elsevier Patient Education  2022 Elsevier Inc.  

## 2020-10-18 ENCOUNTER — Other Ambulatory Visit: Payer: Self-pay | Admitting: *Deleted

## 2020-10-18 NOTE — Patient Outreach (Signed)
[facility]) Care Management  10/18/2020  Ioanna Colquhoun 07-Feb-1945 [id number]   Referral Received 9/26 Initial Outreach 9/27  RN attempted outreach call today however unsuccessful. RN unable to leave a message and will rescheduled another outreach call over the next week for pending [facility] services.  Elliot Cousin, RN Care Management Coordinator [facility] Main Office [phone]

## 2020-10-20 ENCOUNTER — Emergency Department (HOSPITAL_COMMUNITY)
Admission: EM | Admit: 2020-10-20 | Discharge: 2020-10-21 | Disposition: A | Discharge status: Home / Self Care | Payer: Medicare Other | Attending: Emergency Medicine | Admitting: Emergency Medicine

## 2020-10-20 ENCOUNTER — Emergency Department (HOSPITAL_COMMUNITY): Payer: Medicare Other

## 2020-10-20 ENCOUNTER — Encounter (HOSPITAL_COMMUNITY): Payer: Self-pay

## 2020-10-20 DIAGNOSIS — E119 Type 2 diabetes mellitus without complications: Secondary | ICD-10-CM | POA: Insufficient documentation

## 2020-10-20 DIAGNOSIS — I10 Essential (primary) hypertension: Secondary | ICD-10-CM | POA: Diagnosis not present

## 2020-10-20 DIAGNOSIS — M79661 Pain in right lower leg: Secondary | ICD-10-CM | POA: Insufficient documentation

## 2020-10-20 DIAGNOSIS — W1839XA Other fall on same level, initial encounter: Secondary | ICD-10-CM | POA: Diagnosis not present

## 2020-10-20 DIAGNOSIS — Z7982 Long term (current) use of aspirin: Secondary | ICD-10-CM | POA: Insufficient documentation

## 2020-10-20 DIAGNOSIS — S0990XA Unspecified injury of head, initial encounter: Secondary | ICD-10-CM | POA: Insufficient documentation

## 2020-10-20 DIAGNOSIS — Z7902 Long term (current) use of antithrombotics/antiplatelets: Secondary | ICD-10-CM | POA: Diagnosis not present

## 2020-10-20 DIAGNOSIS — S8992XA Unspecified injury of left lower leg, initial encounter: Secondary | ICD-10-CM | POA: Diagnosis present

## 2020-10-20 DIAGNOSIS — Z8616 Personal history of COVID-19: Secondary | ICD-10-CM | POA: Insufficient documentation

## 2020-10-20 DIAGNOSIS — Z7984 Long term (current) use of oral hypoglycemic drugs: Secondary | ICD-10-CM | POA: Insufficient documentation

## 2020-10-20 DIAGNOSIS — Y92480 Sidewalk as the place of occurrence of the external cause: Secondary | ICD-10-CM | POA: Insufficient documentation

## 2020-10-20 DIAGNOSIS — R030 Elevated blood-pressure reading, without diagnosis of hypertension: Secondary | ICD-10-CM

## 2020-10-20 DIAGNOSIS — M79604 Pain in right leg: Secondary | ICD-10-CM

## 2020-10-20 DIAGNOSIS — S80212A Abrasion, left knee, initial encounter: Secondary | ICD-10-CM | POA: Insufficient documentation

## 2020-10-20 DIAGNOSIS — M79662 Pain in left lower leg: Secondary | ICD-10-CM | POA: Insufficient documentation

## 2020-10-20 DIAGNOSIS — Z79899 Other long term (current) drug therapy: Secondary | ICD-10-CM | POA: Diagnosis not present

## 2020-10-20 DIAGNOSIS — W19XXXA Unspecified fall, initial encounter: Secondary | ICD-10-CM

## 2020-10-20 DIAGNOSIS — M79605 Pain in left leg: Secondary | ICD-10-CM

## 2020-10-20 NOTE — ED Provider Notes (Signed)
Emergency Medicine Provider Triage Evaluation Note  [name] , a 75 y.o. female  was evaluated in triage.  Pt complains of headache, fall.  Patient is a difficult historian.  States that she was stepped off a sidewalk and her legs gave out and now she has pain to her left knee and left hip.  She is unsure if she hit her head.  She does not know if she takes a blood thinner, however per chart review she does appear to be on Plavix.  Denies any chest pain, dizziness.  Review of Systems  Positive: As above Negative: As above  Physical Exam  BP (!) 204/85 (BP Location: Left Arm)   Pulse 70   Temp 98.1 F (36.7 C) (Oral)   Resp 18   SpO2 100%  Gen:   Awake, no distress   Resp:  Normal effort  MSK:   Moves extremities without difficulty  Other:  Superficial abrasion to the left knee, no visible deformities, full flexion extension of the left knee.  Alert and oriented x3, no visible head trauma  Medical Decision Making  Medically screening exam initiated at 11:21 PM.  Appropriate orders placed.  Deshanna Kama was informed that the remainder of the evaluation will be completed by another provider, this initial triage assessment does not replace that evaluation, and the importance of remaining in the ED until their evaluation is complete.     Mare Ferrari, PA-C 10/20/20 2323    Melene Plan, DO 10/20/20 2328

## 2020-10-20 NOTE — ED Triage Notes (Signed)
Pt reports fell when stepping off the sidewalk "my leg gave out" and reports pain to left knee (small nonbleeding abrasion to left knee). Denies hitting head, reports pain to left hip as well.

## 2020-10-21 ENCOUNTER — Other Ambulatory Visit: Payer: Self-pay | Admitting: *Deleted

## 2020-10-21 DIAGNOSIS — S80212A Abrasion, left knee, initial encounter: Secondary | ICD-10-CM | POA: Diagnosis not present

## 2020-10-21 MED ORDER — LOSARTAN POTASSIUM 50 MG PO TABS
50.0000 mg | ORAL_TABLET | Freq: Every day | ORAL | Status: DC
  Administered 2020-10-21: 50 mg via ORAL
  Filled 2020-10-21: qty 1

## 2020-10-21 MED ORDER — ACETAMINOPHEN 325 MG PO TABS
650.0000 mg | ORAL_TABLET | Freq: Once | ORAL | Status: AC
  Administered 2020-10-21: 650 mg via ORAL
  Filled 2020-10-21: qty 2

## 2020-10-21 MED ORDER — HYDRALAZINE HCL 25 MG PO TABS
50.0000 mg | ORAL_TABLET | Freq: Once | ORAL | Status: AC
  Administered 2020-10-21: 50 mg via ORAL
  Filled 2020-10-21: qty 2

## 2020-10-21 NOTE — ED Notes (Signed)
Pt refusing hospital gown

## 2020-10-21 NOTE — ED Provider Notes (Signed)
[facility] EMERGENCY DEPARTMENT Provider Note   CSN: [id number] Arrival date & time: 10/20/20  2152     History Chief Complaint  Patient presents with   [name]    [name] is a 75 y.o. female.  75 year old female with history of diabetes, hypertension, CVA, additional history as listed below.  Patient was coming to the emergency room last night for pain in her feet and legs, when she went to step up on the curb to come into the hospital she felt like her leg gave out on her and she fell resulting in an abrasion to her left knee.  Denies hitting her head or loss of consciousness.  After prolonged wait in the emergency room, patient has generalized body aches however is ambulatory without difficulty.  States that she does not need a cane or a walker.  Patient is on Plavix.  No other complaints, injuries or concerns.      Past Medical History:  Diagnosis Date   Diabetes mellitus without complication (HCC)    GERD 09/04/2006   Qualifier: Diagnosis of  By: Duke Salvia     History of echocardiogram    a. 2D ECHO: 11/06/2013 EF 65%; no WMA. Mild TR. 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   Numbness 06/20/2012   Diabetes mellitus (HCC) 06/20/2012   CVA (cerebral infarction) 06/20/2012   Hypokalemia 06/20/2012   ARTHRALGIA 01/28/2009   CHEST PAIN UNSPECIFIED 11/26/2008   DEPRESSION 09/04/2006   Essential hypertension 09/04/2006   GERD 09/04/2006   MULTIPLE SITES, ARTHRITIS-ACUTE/CHRONIC 09/04/2006   LOW BACK PAIN, CHRONIC  09/04/2006    Past Surgical History:  Procedure Laterality Date   ABDOMINAL HYSTERECTOMY     ANTERIOR CERVICAL DECOMPRESSION/DISCECTOMY FUSION 4 LEVELS N/A 11/11/2019   Procedure: Cervical three-four Cervical four-five Cervical five-six Cervical six-seven  Anterior cervical decompression/discectomy/fusion;  Surgeon: Maeola Harman, MD;  Location: [facility] OR;  Service: Neurosurgery;  Laterality: N/A;   LEFT HEART CATHETERIZATION WITH CORONARY ANGIOGRAM N/A 11/05/2013   Procedure: LEFT HEART CATHETERIZATION WITH CORONARY ANGIOGRAM;  Surgeon: Lennette Bihari, MD;  Location: [facility] CATH LAB;  Service: Cardiovascular;  Laterality: N/A;     OB History   No obstetric history on file.     No family history on file.  Social History   Tobacco Use   Smoking status: Never   Smokeless tobacco: Never  Vaping Use   Vaping Use: Never used  Substance Use Topics   Alcohol use: No   Drug use: No    Home Medications Prior to Admission medications   Medication Sig Start Date End Date Taking? Authorizing Provider  albuterol (VENTOLIN HFA) 108 (90 Base) MCG/ACT inhaler Inhale 1-2 puffs into the lungs every 6 (six) hours as needed for wheezing or shortness of breath. 04/22/20   Barbette Merino, NP  aspirin EC 81 MG tablet Take 81 mg by mouth daily. Swallow whole.    [provider]  chlorthalidone (HYGROTON) 25 MG tablet Take 1 tablet (25 mg total) by mouth daily. 10/05/20 10/05/21  Thad Ranger  M, NP  clopidogrel (PLAVIX) 75 MG tablet Take 1 tablet (75 mg total) by mouth daily. 04/22/20   Barbette Merino, NP  diclofenac Sodium (VOLTAREN) 1 % GEL Apply 2 g topically 4 (four) times daily as needed. 07/27/20   Tilden Fossa, MD  diphenhydramine-acetaminophen (TYLENOL PM) 25-500 MG TABS tablet Take 1 tablet by mouth at bedtime.    [provider]  gabapentin (NEURONTIN) 300 MG capsule Take 1 capsule (300 mg total) by mouth 3 (three) times daily. 10/05/20   Barbette Merino, NP  gatifloxacin (ZYMAXID)  0.5 % SOLN Place 1 drop into the right eye 4 (four) times daily. 06/28/20   [provider]  glipiZIDE (GLUCOTROL) 5 MG tablet Take 1 tablet (5 mg total) by mouth 2 (two) times daily before a meal. 04/22/20 04/22/21  Barbette Merino, NP  hydrALAZINE (APRESOLINE) 50 MG tablet Take 1 tablet (50 mg total) by mouth 2 (two) times daily. 04/22/20   Barbette Merino, NP  ketorolac (ACULAR) 0.5 % ophthalmic solution Place 1 drop into the right eye 4 (four) times daily. 06/28/20   [provider]  lidocaine (LIDODERM) 5 % Place 1 patch onto the skin daily. Remove & Discard patch within 12 hours or as directed by MD    [provider]  losartan (COZAAR) 50 MG tablet Take 1 tablet (50 mg total) by mouth daily. 10/05/20 10/05/21  Barbette Merino, NP  metFORMIN (GLUCOPHAGE) 500 MG tablet Take 2 tablets (1,000 mg total) by mouth 2 (two) times daily with a meal. 10/05/20 10/05/21  Barbette Merino, NP  mirabegron ER (MYRBETRIQ) 25 MG TB24 tablet Take 25 mg by mouth daily.    [provider]  [facility] VERIO test strip Use 1-4 times daily as directed/needed   DX E11.9 Patient not taking: No sig reported 04/25/20   Barbette Merino, NP  potassium chloride (KLOR-CON) 10 MEQ tablet Take 10 mEq by mouth daily.    [provider]  prednisoLONE acetate (PRED FORTE) 1 % ophthalmic suspension Place 1 drop into the right eye 4 (four) times daily. 06/28/20   [provider]  rosuvastatin (CRESTOR) 10 MG tablet Take 1 tablet (10 mg total) by mouth at bedtime. 10/05/20 10/05/21  Barbette Merino, NP    Allergies    Codeine, Ibuprofen, and Imdur [isosorbide nitrate]  Review of Systems   Review of Systems  Constitutional:  Negative for fever.  Eyes:  Negative for visual disturbance.  Respiratory:  Negative for shortness of breath.   Cardiovascular:  Negative for chest pain.  Genitourinary:  Negative for dysuria.  Musculoskeletal:  Positive for arthralgias and myalgias. Negative for back pain,  gait problem, joint swelling, neck pain and neck stiffness.  Skin:  Positive for wound.  Allergic/Immunologic: Positive for immunocompromised state.  Neurological:  Negative for weakness and numbness.  Hematological:  Bruises/bleeds easily.  All other systems reviewed and are negative.  Physical Exam Updated Vital Signs BP (!) 146/59   Pulse 77   Temp 98.2 F (36.8 C) (Oral)   Resp 14   SpO2 100%   Physical Exam Vitals and nursing note reviewed.  Constitutional:      General: She is not in acute distress.    Appearance: She is well-developed. She is not diaphoretic.  HENT:     Head: Normocephalic and atraumatic.     Mouth/Throat:     Mouth: Mucous membranes are moist.  Eyes:     Extraocular Movements: Extraocular movements intact.  Pupils: Pupils are equal, round, and reactive to light.  Cardiovascular:     Rate and Rhythm: Normal rate and regular rhythm.     Heart sounds: Normal heart sounds.  Pulmonary:     Effort: Pulmonary effort is normal.     Breath sounds: Normal breath sounds.  Abdominal:     Palpations: Abdomen is soft.     Tenderness: There is no abdominal tenderness.  Musculoskeletal:        General: Tenderness and signs of injury present. No swelling or deformity. Normal range of motion.     Cervical back: Normal range of motion and neck supple. No tenderness.     Comments: ROM upper and lower extremities without pain. Does have a minor abrasion to the left knee, mild tenderness, no crepitus, is able to straight leg raise both legs and flex knees to 90 degrees.   Skin:    General: Skin is warm and dry.     Findings: No erythema or rash.  Neurological:     Mental Status: She is alert and oriented to person, place, and time.     Sensory: No sensory deficit.     Motor: No weakness.  Psychiatric:        Behavior: Behavior normal.    ED Results / Procedures / Treatments   Labs (all labs ordered are listed, but only abnormal results are displayed) Labs  Reviewed - No data to display  EKG None  Radiology DG Knee 2 Views Left  Result Date: 10/20/2020 CLINICAL DATA:  Fall EXAM: LEFT KNEE - 1-2 VIEW COMPARISON:  None. FINDINGS: No evidence of fracture, dislocation, or joint effusion. No evidence of arthropathy or other focal bone abnormality. Soft tissues are unremarkable. IMPRESSION: Negative. Electronically Signed   By: Deatra Robinson M.D.   On: 10/20/2020 23:49   CT HEAD WO CONTRAST ( )  Result Date: 10/21/2020 CLINICAL DATA:  Head trauma. EXAM: CT HEAD WITHOUT CONTRAST TECHNIQUE: Contiguous axial images were obtained from the base of the skull through the vertex without intravenous contrast. COMPARISON:  CT head 10/09/2020. FINDINGS: Brain: No evidence of acute infarction, hemorrhage, hydrocephalus, extra-axial collection or mass lesion/mass effect. Vascular: No hyperdense vessel or unexpected calcification. Skull: Normal. Negative for fracture or focal lesion. Sinuses/Orbits: No acute finding. Other: None. IMPRESSION: No acute intracranial abnormality. Electronically Signed   By: Darliss Cheney M.D.   On: 10/21/2020 00:02   DG Hip Unilat With Pelvis 2-3 Views Left  Result Date: 10/20/2020 CLINICAL DATA:  Fall EXAM: DG HIP (WITH OR WITHOUT PELVIS) 2-3V LEFT COMPARISON:  None. FINDINGS: There is no evidence of hip fracture or dislocation. There is no evidence of arthropathy or other focal bone abnormality. IMPRESSION: Negative. Electronically Signed   By: Deatra Robinson M.D.   On: 10/20/2020 23:49    Procedures Procedures   Medications Ordered in ED Medications  losartan (COZAAR) tablet 50 mg (50 mg Oral Given 10/21/20 1251)  hydrALAZINE (APRESOLINE) tablet 50 mg (50 mg Oral Given 10/21/20 1250)  acetaminophen (TYLENOL) tablet 650 mg (650 mg Oral Given 10/21/20 1302)    ED Course  I have reviewed the triage vital signs and the nursing notes.  Pertinent labs & imaging results that were available during my care of the patient were reviewed  by me and considered in my medical decision making (see chart for details).  Clinical Course as of 10/21/20 1418  Fri Oct 21, 2020  4063 75 year old female presents with complaint of pain in her feet and legs,  also fall upon arrival in the emergency room.  Patient is on Plavix, did not hit head.  CT head without acute injury.  X-ray of the left knee as well as left hip are negative for acute bony injury.  Patient is overall well-appearing on exam.  She is worried about getting home today or getting transportation to the bus stop.  Her blood pressure is elevated, history of hypertension, has been in the emergency room now for 15 hours without her medications.  Plan is to give patient her regular blood pressure medications, reassess and if improved, discharge home. [LM]  1302 BP improved, plan is for dc home to recheck with her doctor.  [LM]    Clinical Course User Index [LM] Alden Hipp   MDM Rules/Calculators/A&P                           Final Clinical Impression(s) / ED Diagnoses Final diagnoses:  Fall, initial encounter  Pain in both lower extremities  Abrasion of left knee, initial encounter  Elevated blood pressure reading    Rx / DC Orders ED Discharge Orders     None        Alden Hipp 10/21/20 1418    Gloris Manchester, MD 10/22/20 2237

## 2020-10-21 NOTE — ED Notes (Signed)
Pt requesting food and drink, pt given crackers and orange juice per her request

## 2020-10-21 NOTE — Discharge Instructions (Addendum)
Follow-up with your primary care doctor for recheck.  Take your medications as prescribed.

## 2020-10-21 NOTE — Patient Outreach (Signed)
[facility]) Care Management  10/21/2020  [name] July 09, 1945 [id number]   Telephone Screen-Unsuccessful Outreach #2  RN attempted outreach call today however unsuccessful. RN unable to  leave a HIPAA approved voice message and will attempted another outreach over the next week.  Elliot Cousin, RN Care Management Coordinator [facility] Main Office [phone]

## 2020-10-21 NOTE — ED Notes (Signed)
Pt requiring cab or assistance to get home. Pt has no one to call and does not think she can ride the bus. Social work consulted and provided a Electronics engineer for pt

## 2020-10-21 NOTE — ED Notes (Signed)
Pt ambulated to the bathroom on her own power, gait slow but even and steady. 

## 2020-10-26 ENCOUNTER — Other Ambulatory Visit: Payer: Medicare Other | Admitting: *Deleted

## 2020-10-26 ENCOUNTER — Other Ambulatory Visit: Payer: Self-pay | Admitting: *Deleted

## 2020-10-26 NOTE — Patient Outreach (Signed)
[facility]) Care Management  10/26/2020  [name] [birth year] [id number]   Telephone Screen-Re: Call back  RN spoke with pt today and introduced [facility] services and inquired if it was a good time to talk. Pt requested a call back later today.  Will follow up later today concerning [facility] services and possible needs that [facility] can address.  Elliot Cousin, RN Care Management Coordinator [facility] Main Office [phone]

## 2020-10-26 NOTE — Patient Outreach (Signed)
[facility]) Care Management  10/26/2020  [name] 1945-12-14 [id number]   Telephone Assessment-Successful  RN spoke in length with this pt concerning [facility] services and the purpose for the call. Offered to assist in anyway with pt's ongoing medical issues HTN/FALLS related to her recent admissions. Pt states she is closest to [facility] and it is more convenient to go to the hospital nearby. RN further explained the importance of having a primary doctor to address most of her issues however pt admant that she has a provider just could not remember who or where their office was located.  RN offer to assist with finding a new provider if this was an option however pt did not wish to pursue this route. RN further offered to assist with education on HTN and to address safety issues related to her recent fall. Pt indicates she is doing well with no issues right now and states she did not have any additional time to talk. RN offered to further assist with another call at her convenience. Pt receptive and requested a call back next week. Offered to send a packet however pt states she was unable to read the material due to her declining vision.  RN able to inform pt this services can assist with finding a provider of choice and obtaining appointments for her annual or eye provider. Stress the importance of establishing a primary provider to address most of his ongoing issues to avoid visiting the ED however pt was not really interested indicated it was more convenient for her to again visit the local emergency dept.   Based upon the ending of this call RN will rescheduled another follow up visit with this pt next week to further discussed how to obtain a provider, eye exam and educate as allowed on her ongoing HTN.  Again pt declined mail out of any material at this time.  Elliot Cousin, RN Care Management Coordinator [facility] Main Office [phone]

## 2020-11-01 ENCOUNTER — Other Ambulatory Visit: Payer: Self-pay | Admitting: *Deleted

## 2020-11-01 NOTE — Patient Outreach (Signed)
[facility]) Care Management  11/01/2020  [name] 04-26-1945 [id number]   Telephone Assessment  RN attempted outreach call to the daughter due to unsuccessful calls to the pt's contact. Daughter Tyler Aas) indicated to call the pt's home number would be the best way to reach her. RN attempted another outreach call to the pt's home number and there was no answer to the available number.  RN will send another outreach call and attempt once again to contact this pt over the next few weeks. No PCP to inquire on further outreach contacts.  Elliot Cousin, RN Care Management Coordinator [facility] Main Office [phone]

## 2020-11-02 ENCOUNTER — Emergency Department (HOSPITAL_COMMUNITY)
Admission: EM | Admit: 2020-11-02 | Discharge: 2020-11-02 | Disposition: A | Discharge status: Home / Self Care | Payer: Medicare Other | Attending: Emergency Medicine | Admitting: Emergency Medicine

## 2020-11-02 ENCOUNTER — Emergency Department (HOSPITAL_COMMUNITY): Payer: Medicare Other

## 2020-11-02 ENCOUNTER — Other Ambulatory Visit: Payer: Self-pay

## 2020-11-02 ENCOUNTER — Ambulatory Visit: Payer: Medicare Other | Admitting: Nurse Practitioner

## 2020-11-02 ENCOUNTER — Encounter (HOSPITAL_COMMUNITY): Payer: Self-pay | Admitting: Emergency Medicine

## 2020-11-02 DIAGNOSIS — I10 Essential (primary) hypertension: Secondary | ICD-10-CM | POA: Insufficient documentation

## 2020-11-02 DIAGNOSIS — Z7984 Long term (current) use of oral hypoglycemic drugs: Secondary | ICD-10-CM | POA: Diagnosis not present

## 2020-11-02 DIAGNOSIS — W19XXXA Unspecified fall, initial encounter: Secondary | ICD-10-CM | POA: Insufficient documentation

## 2020-11-02 DIAGNOSIS — S4992XA Unspecified injury of left shoulder and upper arm, initial encounter: Secondary | ICD-10-CM | POA: Diagnosis present

## 2020-11-02 DIAGNOSIS — E119 Type 2 diabetes mellitus without complications: Secondary | ICD-10-CM | POA: Insufficient documentation

## 2020-11-02 DIAGNOSIS — R519 Headache, unspecified: Secondary | ICD-10-CM | POA: Insufficient documentation

## 2020-11-02 DIAGNOSIS — Z8616 Personal history of COVID-19: Secondary | ICD-10-CM | POA: Diagnosis not present

## 2020-11-02 DIAGNOSIS — Z79899 Other long term (current) drug therapy: Secondary | ICD-10-CM | POA: Diagnosis not present

## 2020-11-02 DIAGNOSIS — Z7902 Long term (current) use of antithrombotics/antiplatelets: Secondary | ICD-10-CM | POA: Insufficient documentation

## 2020-11-02 DIAGNOSIS — S46912A Strain of unspecified muscle, fascia and tendon at shoulder and upper arm level, left arm, initial encounter: Secondary | ICD-10-CM | POA: Diagnosis not present

## 2020-11-02 DIAGNOSIS — Z7982 Long term (current) use of aspirin: Secondary | ICD-10-CM | POA: Insufficient documentation

## 2020-11-02 LAB — BASIC METABOLIC PANEL
Anion gap: 11 (ref 5–15)
BUN: 13 mg/dL (ref 8–23)
CO2: 25 mmol/L (ref 22–32)
Calcium: 8.8 mg/dL — ABNORMAL LOW (ref 8.9–10.3)
Chloride: 100 mmol/L (ref 98–111)
Creatinine, Ser: 1.11 mg/dL — ABNORMAL HIGH (ref 0.44–1.00)
GFR, Estimated: 52 mL/min — ABNORMAL LOW (ref >60)
Glucose, Bld: 119 mg/dL — ABNORMAL HIGH (ref 70–99)
Potassium: 3.7 mmol/L (ref 3.5–5.1)
Sodium: 136 mmol/L (ref 135–145)

## 2020-11-02 LAB — CBC
HCT: 35.1 % — ABNORMAL LOW (ref 36.0–46.0)
Hemoglobin: 11.1 g/dL — ABNORMAL LOW (ref 12.0–15.0)
MCH: 29.4 pg (ref 26.0–34.0)
MCHC: 31.6 g/dL (ref 30.0–36.0)
MCV: 93.1 fL (ref 80.0–100.0)
Platelets: 264 10*3/uL (ref 150–400)
RBC: 3.77 MIL/uL — ABNORMAL LOW (ref 3.87–5.11)
RDW: 12.5 % (ref 11.5–15.5)
WBC: 9 10*3/uL (ref 4.0–10.5)
nRBC: 0 % (ref 0.0–0.2)

## 2020-11-02 LAB — TROPONIN I (HIGH SENSITIVITY)
Troponin I (High Sensitivity): 7 ng/L (ref <18)
Troponin I (High Sensitivity): 8 ng/L (ref <18)
Troponin I (High Sensitivity): 8 ng/L (ref <18)

## 2020-11-02 MED ORDER — LOSARTAN POTASSIUM 50 MG PO TABS
50.0000 mg | ORAL_TABLET | Freq: Once | ORAL | Status: AC
  Administered 2020-11-02: 50 mg via ORAL
  Filled 2020-11-02: qty 1

## 2020-11-02 MED ORDER — HYDRALAZINE HCL 25 MG PO TABS
50.0000 mg | ORAL_TABLET | Freq: Once | ORAL | Status: AC
  Administered 2020-11-02: 50 mg via ORAL
  Filled 2020-11-02: qty 2

## 2020-11-02 MED ORDER — ACETAMINOPHEN 500 MG PO TABS
1000.0000 mg | ORAL_TABLET | Freq: Once | ORAL | Status: AC
  Administered 2020-11-02: 1000 mg via ORAL
  Filled 2020-11-02 (×2): qty 2

## 2020-11-02 MED ORDER — CHLORTHALIDONE 25 MG PO TABS
25.0000 mg | ORAL_TABLET | Freq: Once | ORAL | Status: AC
  Administered 2020-11-02: 25 mg via ORAL
  Filled 2020-11-02: qty 1

## 2020-11-02 NOTE — Discharge Instructions (Addendum)
Take your medications as directed.  Return if worse.

## 2020-11-02 NOTE — ED Triage Notes (Signed)
Patient arrived arrived with EMS from home reports left chest pain with headache for several days , no SOB , hypertensive BP= 212/86 by EMS .

## 2020-11-02 NOTE — ED Provider Notes (Signed)
[facility] EMERGENCY DEPARTMENT Provider Note   CSN: [id number] Arrival date & time: 11/02/20  0121     History Chief Complaint  Patient presents with   Headache/Chest Pain/Hypertension     [name] is a 75 y.o. female.  Pt presents to the ED today with multiple complaints.  She said she has cp, headache, left shoulder pain, feet pain and high blood pressure.  Pt said she fell about a month ago and has been having left shoulder pain since then.  She did have an x ray which was normal per pt.  Pt also said she's been taking her bp meds like she is supposed to, but still has high blood pressure.  She feels this is why she has a headache and cp.  Pt denies any sob or n/v.  Pt said sx have been going on for several days.      Past Medical History:  Diagnosis Date   Diabetes mellitus without complication (HCC)    GERD 09/04/2006   Qualifier: Diagnosis of  By: Duke Salvia     History of echocardiogram    a. 2D ECHO: 11/06/2013 EF 65%; no WMA. Mild TR. 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   Numbness 06/20/2012   Diabetes mellitus (HCC) 06/20/2012   CVA (cerebral infarction) 06/20/2012   Hypokalemia 06/20/2012   ARTHRALGIA 01/28/2009   CHEST PAIN UNSPECIFIED 11/26/2008   DEPRESSION 09/04/2006   Essential hypertension 09/04/2006   GERD 09/04/2006   MULTIPLE SITES, ARTHRITIS-ACUTE/CHRONIC 09/04/2006   LOW BACK PAIN, CHRONIC 09/04/2006    Past Surgical History:  Procedure Laterality Date   ABDOMINAL HYSTERECTOMY      ANTERIOR CERVICAL DECOMPRESSION/DISCECTOMY FUSION 4 LEVELS N/A 11/11/2019   Procedure: Cervical three-four Cervical four-five Cervical five-six Cervical six-seven  Anterior cervical decompression/discectomy/fusion;  Surgeon: Maeola Harman, MD;  Location: [facility] OR;  Service: Neurosurgery;  Laterality: N/A;   LEFT HEART CATHETERIZATION WITH CORONARY ANGIOGRAM N/A 11/05/2013   Procedure: LEFT HEART CATHETERIZATION WITH CORONARY ANGIOGRAM;  Surgeon: Lennette Bihari, MD;  Location: [facility] CATH LAB;  Service: Cardiovascular;  Laterality: N/A;     OB History   No obstetric history on file.     No family history on file.  Social History   Tobacco Use   Smoking status: Never   Smokeless tobacco: Never  Vaping Use   Vaping Use: Never used  Substance Use Topics   Alcohol use: No   Drug use: No    Home Medications Prior to Admission medications   Medication Sig Start Date End Date Taking? Authorizing Provider  albuterol (VENTOLIN HFA) 108 (90 Base) MCG/ACT inhaler Inhale 1-2 puffs into the lungs every 6 (six) hours as needed for wheezing or shortness of breath. 04/22/20   Barbette Merino, NP  aspirin EC 81 MG tablet Take 81 mg by mouth daily. Swallow whole.    [provider]  chlorthalidone (HYGROTON) 25 MG tablet Take 1 tablet (25 mg total) by mouth daily. 10/05/20 10/05/21  Barbette Merino, NP  clopidogrel (PLAVIX) 75 MG  tablet Take 1 tablet (75 mg total) by mouth daily. 04/22/20   Barbette Merino, NP  diclofenac Sodium (VOLTAREN) 1 % GEL Apply 2 g topically 4 (four) times daily as needed. 07/27/20   Tilden Fossa, MD  diphenhydramine-acetaminophen (TYLENOL PM) 25-500 MG TABS tablet Take 1 tablet by mouth at bedtime.    [provider]  gabapentin (NEURONTIN) 300 MG capsule Take 1 capsule (300 mg total) by mouth 3 (three) times daily. 10/05/20   Barbette Merino, NP  gatifloxacin (ZYMAXID) 0.5 % SOLN Place 1 drop into the right eye 4 (four) times daily. 06/28/20   [provider]   glipiZIDE (GLUCOTROL) 5 MG tablet Take 1 tablet (5 mg total) by mouth 2 (two) times daily before a meal. 04/22/20 04/22/21  Barbette Merino, NP  hydrALAZINE (APRESOLINE) 50 MG tablet Take 1 tablet (50 mg total) by mouth 2 (two) times daily. 04/22/20   Barbette Merino, NP  ketorolac (ACULAR) 0.5 % ophthalmic solution Place 1 drop into the right eye 4 (four) times daily. 06/28/20   [provider]  lidocaine (LIDODERM) 5 % Place 1 patch onto the skin daily. Remove & Discard patch within 12 hours or as directed by MD    [provider]  losartan (COZAAR) 50 MG tablet Take 1 tablet (50 mg total) by mouth daily. 10/05/20 10/05/21  Barbette Merino, NP  metFORMIN (GLUCOPHAGE) 500 MG tablet Take 2 tablets (1,000 mg total) by mouth 2 (two) times daily with a meal. 10/05/20 10/05/21  Barbette Merino, NP  mirabegron ER (MYRBETRIQ) 25 MG TB24 tablet Take 25 mg by mouth daily.    [provider]  [facility] VERIO test strip Use 1-4 times daily as directed/needed   DX E11.9 Patient not taking: No sig reported 04/25/20   Barbette Merino, NP  potassium chloride (KLOR-CON) 10 MEQ tablet Take 10 mEq by mouth daily.    [provider]  prednisoLONE acetate (PRED FORTE) 1 % ophthalmic suspension Place 1 drop into the right eye 4 (four) times daily. 06/28/20   [provider]  rosuvastatin (CRESTOR) 10 MG tablet Take 1 tablet (10 mg total) by mouth at bedtime. 10/05/20 10/05/21  Barbette Merino, NP    Allergies    Codeine, Ibuprofen, and Imdur [isosorbide nitrate]  Review of Systems   Review of Systems  Cardiovascular:  Positive for chest pain.  Musculoskeletal:  Positive for myalgias.       Left shoulder pain Leg pain  Neurological:  Positive for headaches.  All other systems reviewed and are negative.  Physical Exam Updated Vital Signs BP (!) 158/62   Pulse 76   Temp 98.1 F (36.7 C) (Oral)   Resp 18   Ht 5\' 4"  (1.626 m)   Wt 72 kg   SpO2 100%   BMI 27.25 kg/m    Physical Exam Vitals and nursing note reviewed.  Constitutional:      Appearance: Normal appearance.  HENT:     Head: Normocephalic and atraumatic.     Right Ear: External ear normal.     Left Ear: External ear normal.     Nose: Nose normal.     Mouth/Throat:     Mouth: Mucous membranes are moist.     Pharynx: Oropharynx is clear.  Eyes:     Extraocular Movements: Extraocular movements intact.     Conjunctiva/sclera: Conjunctivae normal.     Pupils: Pupils are equal, round, and reactive to light.  Cardiovascular:  Rate and Rhythm: Normal rate and regular rhythm.     Pulses: Normal pulses.     Heart sounds: Normal heart sounds.  Pulmonary:     Effort: Pulmonary effort is normal.     Breath sounds: Normal breath sounds.  Abdominal:     General: Abdomen is flat. Bowel sounds are normal.     Palpations: Abdomen is soft.  Musculoskeletal:        General: Normal range of motion.     Cervical back: Normal range of motion and neck supple.  Skin:    General: Skin is warm.     Capillary Refill: Capillary refill takes less than 2 seconds.  Neurological:     General: No focal deficit present.     Mental Status: She is alert and oriented to person, place, and time.  Psychiatric:        Mood and Affect: Mood normal.        Behavior: Behavior normal.    ED Results / Procedures / Treatments   Labs (all labs ordered are listed, but only abnormal results are displayed) Labs Reviewed  CBC - Abnormal; Notable for the following components:      Result Value   RBC 3.77 (*)    Hemoglobin 11.1 (*)    HCT 35.1 (*)    All other components within normal limits  BASIC METABOLIC PANEL - Abnormal; Notable for the following components:   Glucose, Bld 119 (*)    Creatinine, Ser 1.11 (*)    Calcium 8.8 (*)    GFR, Estimated 52 (*)    All other components within normal limits  TROPONIN I (HIGH SENSITIVITY)  TROPONIN I (HIGH SENSITIVITY)  TROPONIN I (HIGH SENSITIVITY)  TROPONIN I (HIGH  SENSITIVITY)    EKG EKG Interpretation  Date/Time:  Wednesday November 02 2020 10:04:37 EDT Ventricular Rate:  67 PR Interval:  116 QRS Duration: 69 QT Interval:  431 QTC Calculation: 455 R Axis:   56 Text Interpretation: Sinus rhythm Borderline short PR interval No significant change since last tracing Confirmed by [name] [phone]) on 11/02/2020 10:13:35 AM  Radiology CT Head Wo Contrast  Result Date: 11/02/2020 CLINICAL DATA:  Headache, fall EXAM: CT HEAD WITHOUT CONTRAST TECHNIQUE: Contiguous axial images were obtained from the base of the skull through the vertex without intravenous contrast. COMPARISON:  Brain MRI 10/18/2012, CT head 10/20/2020 FINDINGS: Brain: There is no acute intracranial hemorrhage, extra-axial fluid collection, or acute infarct. Parenchymal volume is normal.  The ventricles are normal in size. There is no mass lesion.  There is no midline shift. Vascular: No hyperdense vessel or unexpected calcification. Skull: Normal. Negative for fracture or focal lesion. Sinuses/Orbits: The paranasal sinuses are clear. The globes and orbits are unremarkable. Other: None. IMPRESSION: No acute intracranial pathology. Electronically Signed   By: Lesia Hausen M.D.   On: 11/02/2020 10:51   DG Shoulder Left  Result Date: 11/02/2020 CLINICAL DATA:  Fall, pain, limited range of motion EXAM: LEFT SHOULDER - 2+ VIEW COMPARISON:  None. FINDINGS: There is no acute fracture or dislocation. Shoulder alignment appears normal. The joint spaces are preserved. There is greater tuberosity surface irregularity which can be seen with rotator cuff pathology. The soft tissues are unremarkable. IMPRESSION: 1. No acute fracture or dislocation. 2. Greater tuberosity surface irregularity which can be seen with rotator cuff pathology. Electronically Signed   By: Lesia Hausen M.D.   On: 11/02/2020 10:47    Procedures Procedures   Medications Ordered in ED Medications  acetaminophen (TYLENOL)  tablet 1,000 mg (1,000 mg Oral Given 11/02/20 1005)  chlorthalidone (HYGROTON) tablet 25 mg (25 mg Oral Given 11/02/20 1041)  hydrALAZINE (APRESOLINE) tablet 50 mg (50 mg Oral Given 11/02/20 1006)  losartan (COZAAR) tablet 50 mg (50 mg Oral Given 11/02/20 1006)    ED Course  I have reviewed the triage vital signs and the nursing notes.  Pertinent labs & imaging results that were available during my care of the patient were reviewed by me and considered in my medical decision making (see chart for details).    MDM Rules/Calculators/A&P                           Pt given her routine meds and bp is back down to 158/62.  I suspect she has not been taking her meds as directed.  CT head neg for anything acute.  Pt given a sling for her left shoulder.  She is encouraged to f/u with pcp and with ortho.  Return if worse.    Final Clinical Impression(s) / ED Diagnoses Final diagnoses:  Hypertension, unspecified type  Strain of left shoulder, initial encounter    Rx / DC Orders ED Discharge Orders     None        [name], MD 11/02/20 1159

## 2020-11-02 NOTE — ED Provider Notes (Signed)
Emergency Medicine Provider Triage Evaluation Note  [name] , a 75 y.o. female  was evaluated in triage.  Pt complains of chest pain.  The patient reports that she is not having persistent left-sided chest pain, headache, pain in her feet, right shoulder for the last week and a half.  Reports that her blood pressure has been high despite compliance with her home medications.  Blood pressure in route with EMS was 212/86.  No vomiting, shortness of breath, fever, chills, numbness, weakness, or visual changes.  Review of Systems  Positive: Chest pain, headache, arthralgias, myalgias Negative: Shortness of breath, fever, chills, vomiting, numbness, weakness, visual changes, abdominal pain, difficulty urinating  Physical Exam  Ht 5\' 4"  (1.626 m)   Wt 72 kg   BMI 27.25 kg/m  Gen:   Awake, no distress   Resp:  Normal effort  MSK:   Moves extremities without difficulty  Other:  Abdomen soft, nontender, nondistended.  Heart is regular rate and rhythm.  Medical Decision Making  Medically screening exam initiated at 1:45 AM.  Appropriate orders placed.  Emmerson Shuffield was informed that the remainder of the evaluation will be completed by another provider, this initial triage assessment does not replace that evaluation, and the importance of remaining in the ED until their evaluation is complete.  Labs and EKG have been ordered.  She will require further work-up evaluation in the emergency department.   [name] A, PA-C 11/02/20 0145    01/02/21, MD 11/02/20 [phone]

## 2020-11-02 NOTE — ED Notes (Signed)
This tech attempted to help pt change into gown, pt then refused to change into gown.

## 2020-11-09 ENCOUNTER — Encounter (HOSPITAL_COMMUNITY): Payer: Self-pay | Admitting: Emergency Medicine

## 2020-11-09 ENCOUNTER — Other Ambulatory Visit: Payer: Self-pay

## 2020-11-09 ENCOUNTER — Emergency Department (HOSPITAL_COMMUNITY)
Admission: EM | Admit: 2020-11-09 | Discharge: 2020-11-09 | Disposition: A | Discharge status: Home / Self Care | Payer: Medicare Other | Attending: Emergency Medicine | Admitting: Emergency Medicine

## 2020-11-09 DIAGNOSIS — Z7982 Long term (current) use of aspirin: Secondary | ICD-10-CM | POA: Insufficient documentation

## 2020-11-09 DIAGNOSIS — I10 Essential (primary) hypertension: Secondary | ICD-10-CM | POA: Diagnosis not present

## 2020-11-09 DIAGNOSIS — R519 Headache, unspecified: Secondary | ICD-10-CM | POA: Insufficient documentation

## 2020-11-09 DIAGNOSIS — Z79899 Other long term (current) drug therapy: Secondary | ICD-10-CM | POA: Insufficient documentation

## 2020-11-09 DIAGNOSIS — E119 Type 2 diabetes mellitus without complications: Secondary | ICD-10-CM | POA: Diagnosis not present

## 2020-11-09 DIAGNOSIS — H9202 Otalgia, left ear: Secondary | ICD-10-CM | POA: Insufficient documentation

## 2020-11-09 DIAGNOSIS — H538 Other visual disturbances: Secondary | ICD-10-CM | POA: Insufficient documentation

## 2020-11-09 DIAGNOSIS — Z7984 Long term (current) use of oral hypoglycemic drugs: Secondary | ICD-10-CM | POA: Insufficient documentation

## 2020-11-09 LAB — CBC WITH DIFFERENTIAL/PLATELET
Abs Immature Granulocytes: 0.01 10*3/uL (ref 0.00–0.07)
Basophils Absolute: 0 10*3/uL (ref 0.0–0.1)
Basophils Relative: 1 %
Eosinophils Absolute: 0.1 10*3/uL (ref 0.0–0.5)
Eosinophils Relative: 1 %
HCT: 35.5 % — ABNORMAL LOW (ref 36.0–46.0)
Hemoglobin: 11.3 g/dL — ABNORMAL LOW (ref 12.0–15.0)
Immature Granulocytes: 0 %
Lymphocytes Relative: 23 %
Lymphs Abs: 1.6 10*3/uL (ref 0.7–4.0)
MCH: 29.3 pg (ref 26.0–34.0)
MCHC: 31.8 g/dL (ref 30.0–36.0)
MCV: 92 fL (ref 80.0–100.0)
Monocytes Absolute: 0.5 10*3/uL (ref 0.1–1.0)
Monocytes Relative: 7 %
Neutro Abs: 4.5 10*3/uL (ref 1.7–7.7)
Neutrophils Relative %: 68 %
Platelets: 298 10*3/uL (ref 150–400)
RBC: 3.86 MIL/uL — ABNORMAL LOW (ref 3.87–5.11)
RDW: 12.2 % (ref 11.5–15.5)
WBC: 6.7 10*3/uL (ref 4.0–10.5)
nRBC: 0 % (ref 0.0–0.2)

## 2020-11-09 LAB — COMPREHENSIVE METABOLIC PANEL
ALT: 20 U/L (ref 0–44)
AST: 27 U/L (ref 15–41)
Albumin: 3.6 g/dL (ref 3.5–5.0)
Alkaline Phosphatase: 96 U/L (ref 38–126)
Anion gap: 8 (ref 5–15)
BUN: 12 mg/dL (ref 8–23)
CO2: 26 mmol/L (ref 22–32)
Calcium: 8.6 mg/dL — ABNORMAL LOW (ref 8.9–10.3)
Chloride: 102 mmol/L (ref 98–111)
Creatinine, Ser: 1.02 mg/dL — ABNORMAL HIGH (ref 0.44–1.00)
GFR, Estimated: 58 mL/min — ABNORMAL LOW (ref >60)
Glucose, Bld: 126 mg/dL — ABNORMAL HIGH (ref 70–99)
Potassium: 3.7 mmol/L (ref 3.5–5.1)
Sodium: 136 mmol/L (ref 135–145)
Total Bilirubin: 0.4 mg/dL (ref 0.3–1.2)
Total Protein: 7.9 g/dL (ref 6.5–8.1)

## 2020-11-09 MED ORDER — ACETAMINOPHEN 325 MG PO TABS
650.0000 mg | ORAL_TABLET | Freq: Once | ORAL | Status: AC
  Administered 2020-11-09: 650 mg via ORAL
  Filled 2020-11-09: qty 2

## 2020-11-09 MED ORDER — CHLORTHALIDONE 25 MG PO TABS
25.0000 mg | ORAL_TABLET | Freq: Once | ORAL | Status: AC
  Administered 2020-11-09: 25 mg via ORAL
  Filled 2020-11-09: qty 1

## 2020-11-09 NOTE — ED Provider Notes (Signed)
[facility] EMERGENCY DEPARTMENT Provider Note   CSN: [id number] Arrival date & time: 11/09/20  1723     History Chief Complaint  Patient presents with   Headache   Otalgia    [name] is a 75 y.o. female.  Patient with history of diabetes, hypertension, previous stroke, previous ED visits for multiple complaints including falls, 4 CT scans of the head over the past 5 weeks --presents to the emergency department today for evaluation of ear pain and headache.  She reports pain in her left ear for "a few days".  No ear drainage.  She has a generalized headache.  She cannot tell me when this started.  She reports chronically worsening blurry vision.  She states that she cannot do things such as read her Bible, see the TV well.  This is not an acutely changing condition for her.  No loss of consciousness.  She denies any recent cold or fever symptoms.  No chest pain or shortness of breath.  No nausea or vomiting.  No treatments prior to arrival.  She states that she takes her medications every day as prescribed.  Of note, I asked the patient who her primary care doctor was.  She is unable to give me the name.  She did see them about a month ago per epic.  Patient states her preference to come to the emergency department when she has a problem.      Past Medical History:  Diagnosis Date   Diabetes mellitus without complication (HCC)    GERD 09/04/2006   Qualifier: Diagnosis of  By: Duke Salvia     History of echocardiogram    a. 2D ECHO: 11/06/2013 EF 65%; no WMA. Mild TR. 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   Numbness 06/20/2012   Diabetes mellitus (HCC) 06/20/2012   CVA (cerebral infarction) 06/20/2012   Hypokalemia 06/20/2012   ARTHRALGIA 01/28/2009   CHEST PAIN UNSPECIFIED 11/26/2008   DEPRESSION 09/04/2006   Essential hypertension 09/04/2006   GERD 09/04/2006   MULTIPLE SITES, ARTHRITIS-ACUTE/CHRONIC 09/04/2006   LOW BACK PAIN, CHRONIC 09/04/2006    Past Surgical History:  Procedure Laterality Date   ABDOMINAL HYSTERECTOMY     ANTERIOR CERVICAL DECOMPRESSION/DISCECTOMY FUSION 4 LEVELS N/A 11/11/2019   Procedure: Cervical three-four Cervical four-five Cervical five-six Cervical six-seven  Anterior cervical decompression/discectomy/fusion;  Surgeon: Maeola Harman, MD;  Location: [facility] OR;  Service: Neurosurgery;  Laterality: N/A;   LEFT HEART CATHETERIZATION WITH CORONARY ANGIOGRAM N/A 11/05/2013   Procedure: LEFT HEART CATHETERIZATION WITH CORONARY ANGIOGRAM;  Surgeon: Lennette Bihari, MD;  Location: [facility] CATH LAB;  Service: Cardiovascular;  Laterality: N/A;     OB History   No obstetric history on file.     No family history on file.  Social History   Tobacco Use   Smoking status: Never   Smokeless tobacco: Never  Vaping Use   Vaping Use: Never used  Substance Use Topics   Alcohol use: No   Drug use: No    Home Medications Prior to Admission  medications   Medication Sig Start Date End Date Taking? Authorizing Provider  albuterol (VENTOLIN HFA) 108 (90 Base) MCG/ACT inhaler Inhale 1-2 puffs into the lungs every 6 (six) hours as needed for wheezing or shortness of breath. 04/22/20   Barbette Merino, NP  aspirin EC 81 MG tablet Take 81 mg by mouth daily. Swallow whole.    [provider]  chlorthalidone (HYGROTON) 25 MG tablet Take 1 tablet (25 mg total) by mouth daily. 10/05/20 10/05/21  Barbette Merino, NP  clopidogrel (PLAVIX) 75 MG tablet Take 1 tablet (75 mg total) by mouth  daily. 04/22/20   Barbette Merino, NP  diclofenac Sodium (VOLTAREN) 1 % GEL Apply 2 g topically 4 (four) times daily as needed. 07/27/20   Tilden Fossa, MD  diphenhydramine-acetaminophen (TYLENOL PM) 25-500 MG TABS tablet Take 1 tablet by mouth at bedtime.    [provider]  gabapentin (NEURONTIN) 300 MG capsule Take 1 capsule (300 mg total) by mouth 3 (three) times daily. 10/05/20   Barbette Merino, NP  gatifloxacin (ZYMAXID) 0.5 % SOLN Place 1 drop into the right eye 4 (four) times daily. 06/28/20   [provider]  glipiZIDE (GLUCOTROL) 5 MG tablet Take 1 tablet (5 mg total) by mouth 2 (two) times daily before a meal. 04/22/20 04/22/21  Barbette Merino, NP  hydrALAZINE (APRESOLINE) 50 MG tablet Take 1 tablet (50 mg total) by mouth 2 (two) times daily. 04/22/20   Barbette Merino, NP  ketorolac (ACULAR) 0.5 % ophthalmic solution Place 1 drop into the right eye 4 (four) times daily. 06/28/20   [provider]  lidocaine (LIDODERM) 5 % Place 1 patch onto the skin daily. Remove & Discard patch within 12 hours or as directed by MD    [provider]  losartan (COZAAR) 50 MG tablet Take 1 tablet (50 mg total) by mouth daily. 10/05/20 10/05/21  Barbette Merino, NP  metFORMIN (GLUCOPHAGE) 500 MG tablet Take 2 tablets (1,000 mg total) by mouth 2 (two) times daily with a meal. 10/05/20 10/05/21  Barbette Merino, NP  mirabegron ER (MYRBETRIQ) 25 MG TB24 tablet Take 25 mg by mouth daily.    [provider]  [facility] VERIO test strip Use 1-4 times daily as directed/needed   DX E11.9 Patient not taking: No sig reported 04/25/20   Barbette Merino, NP  potassium chloride (KLOR-CON) 10 MEQ tablet Take 10 mEq by mouth daily.    [provider]  prednisoLONE acetate (PRED FORTE) 1 % ophthalmic suspension Place 1 drop into the right eye 4 (four) times daily. 06/28/20   [provider]  rosuvastatin (CRESTOR) 10 MG tablet Take 1 tablet (10 mg total) by mouth at bedtime.  10/05/20 10/05/21  Barbette Merino, NP    Allergies    Codeine, Ibuprofen, and Imdur [isosorbide nitrate]  Review of Systems   Review of Systems  Constitutional:  Negative for fever.  HENT:  Positive for ear pain. Negative for ear discharge, rhinorrhea and sore throat.   Eyes:  Positive for visual disturbance (blurry vision). Negative for redness.  Respiratory:  Negative for cough.   Cardiovascular:  Negative for chest pain.  Gastrointestinal:  Negative for abdominal pain, diarrhea, nausea and vomiting.  Genitourinary:  Negative for dysuria, frequency, hematuria and urgency.  Musculoskeletal:  Negative for myalgias.  Skin:  Negative for rash.  Neurological:  Positive for headaches.   Physical Exam Updated Vital Signs BP (!) 195/88 (BP Location: Right Arm)  Pulse 78   Temp 98.4 F (36.9 C) (Oral)   Resp 18   Ht 5\' 4"  (1.626 m)   Wt 70.3 kg   SpO2 97%   BMI 26.61 kg/m   Physical Exam Vitals and nursing note reviewed.  Constitutional:      General: She is not in acute distress.    Appearance: She is well-developed.  HENT:     Head: Normocephalic and atraumatic.     Jaw: No trismus.     Right Ear: Tympanic membrane, ear canal and external ear normal.     Left Ear: Tympanic membrane, ear canal and external ear normal.     Ears:     Comments: TMs obscured by cerumen bilaterally    Nose: Nose normal. No mucosal edema or rhinorrhea.     Mouth/Throat:     Mouth: Mucous membranes are moist. Mucous membranes are not dry. No oral lesions.     Pharynx: Uvula midline. No oropharyngeal exudate, posterior oropharyngeal erythema or uvula swelling.     Tonsils: No tonsillar abscesses.  Eyes:     General: Lids are normal. No scleral icterus.       Right eye: No discharge.        Left eye: No discharge.     Extraocular Movements:     Right eye: No nystagmus.     Left eye: No nystagmus.     Conjunctiva/sclera: Conjunctivae normal.     Pupils: Pupils are equal, round, and reactive  to light.     Right eye: Pupil is round and reactive.     Left eye: Pupil is round and reactive.     Comments: Right pupil 3 mm, left pupil 4 mm, both reactive to light.  EOMI, normal sclera without injection or exudates.  Cardiovascular:     Rate and Rhythm: Normal rate and regular rhythm.     Heart sounds: Normal heart sounds. No murmur heard. Pulmonary:     Effort: Pulmonary effort is normal. No respiratory distress.     Breath sounds: Normal breath sounds. No wheezing, rhonchi or rales.  Abdominal:     Palpations: Abdomen is soft.     Tenderness: There is no abdominal tenderness. There is no guarding or rebound.  Musculoskeletal:     Cervical back: Normal range of motion and neck supple. No tenderness or bony tenderness.     Right lower leg: No edema.     Left lower leg: No edema.  Lymphadenopathy:     Cervical: No cervical adenopathy.  Skin:    General: Skin is warm and dry.     Findings: No rash.  Neurological:     General: No focal deficit present.     Mental Status: She is alert and oriented to person, place, and time. Mental status is at baseline.     GCS: GCS eye subscore is 4. GCS verbal subscore is 5. GCS motor subscore is 6.     Cranial Nerves: No cranial nerve deficit.     Sensory: No sensory deficit.     Motor: No weakness.     Coordination: Coordination normal.     Gait: Gait normal.     Comments: Upper extremity myotomes tested bilaterally:  C5 Shoulder abduction 5/5 C6 Elbow flexion/wrist extension 5/5 C7 Elbow extension 5/5 C8 Finger flexion 5/5 T1 Finger abduction 5/5  Lower extremity myotomes tested bilaterally: L2 Hip flexion 5/5 L3 Knee extension 5/5 L4 Ankle dorsiflexion 5/5 S1 Ankle plantar flexion 5/5   Psychiatric:  Mood and Affect: Mood normal.    ED Results / Procedures / Treatments   Labs (all labs ordered are listed, but only abnormal results are displayed) Labs Reviewed  CBC WITH DIFFERENTIAL/PLATELET - Abnormal; Notable for  the following components:      Result Value   RBC 3.86 (*)    Hemoglobin 11.3 (*)    HCT 35.5 (*)    All other components within normal limits  COMPREHENSIVE METABOLIC PANEL - Abnormal; Notable for the following components:   Glucose, Bld 126 (*)    Creatinine, Ser 1.02 (*)    Calcium 8.6 (*)    GFR, Estimated 58 (*)    All other components within normal limits    EKG None  Radiology No results found.  Procedures Procedures   Medications Ordered in ED Medications  chlorthalidone (HYGROTON) tablet 25 mg (has no administration in time range)  acetaminophen (TYLENOL) tablet 650 mg (has no administration in time range)    ED Course  I have reviewed the triage vital signs and the nursing notes.  Pertinent labs & imaging results that were available during my care of the patient were reviewed by me and considered in my medical decision making (see chart for details).  Patient seen and examined.  She has several complaints.  She is overall a poor historian.  She complains of left ear pain.  Ear canal appears normal.  She has cerumen obscuring the TMs on both sides.  No obvious signs of infection.  Will request irrigation of the ears bilaterally.  Headache: No focal deficits.  This seems to be a chronic problem for the patient.  She has had 4 negative head CTs in the past 5 weeks.  I am not particularly concerned for stroke at this time.  Blurry vision: No loss of vision.  This is a progressive problem for the patient.  She states that she has been given drops for this in the past that were "not strong enough" and also reading glasses that were "not strong enough".  Will provide referral for ophthalmology.   Vital signs reviewed and are as follows: BP (!) 195/88 (BP Location: Right Arm)   Pulse 78   Temp 98.4 F (36.9 C) (Oral)   Resp 18   Ht 5\' 4"  (1.626 m)   Wt 70.3 kg   SpO2 97%   BMI 26.61 kg/m   Patient given dose of blood pressure medication.  Ears improved after  irrigation.  Plan for discharged home.  I did discuss the case with Dr. Karene Fry prior to discharge.    MDM Rules/Calculators/A&P                           Patient with several nonemergent complaints tonight.  No strokelike symptoms.  She does have left ear pain.  Bilateral cerumen noted in ears were irrigated.  She has a headache which is not unusual for her.  No concerning new neurologic deficits and I do not feel that patient requires another CT scan of her head tonight.  No fevers, confusion or signs of meningitis.  In addition, she reports blurry vision however this is clearly a chronically worsening issue for the patient.  She can follow-up with [place] as an outpatient.  I have provided referral to ophthalmology for an eye exam.  She does have diabetes which puts her at high risk for vision deterioration.   Final Clinical Impression(s) / ED Diagnoses Final diagnoses:  Left  ear pain  Acute nonintractable headache, unspecified headache type  Blurry vision    Rx / DC Orders ED Discharge Orders     None        Renne Crigler, PA-C 11/09/20 2143    Ernie Avena, MD 11/09/20 [phone]

## 2020-11-09 NOTE — ED Notes (Signed)
Left ear flushed with NS solution , impacted cerumen flushed out after several attempts.

## 2020-11-09 NOTE — ED Provider Notes (Signed)
Emergency Medicine Provider Triage Evaluation Note  [name] , a 75 y.o. female  was evaluated in triage.  Pt complains of headache, hypertension, and left ear pain.  Patient reports that she has had headache ongoing for several days.  Denies any associated numbness, weakness, visual disturbance, facial asymmetry or dysarthria.  Patient endorses taking her blood pressure medication as prescribed.  Denies any recent falls or injuries.  Patient reports that she has had left ear pain over the last few days.  Denies any otorrhea.  Review of Systems  Positive: Headache, hypertension, left ear otalgia Negative: Numbness, weakness, facial asymmetry, dysarthria, aphasia  Physical Exam  BP (!) 195/88 (BP Location: Right Arm)   Pulse 78   Temp 98.4 F (36.9 C) (Oral)   Resp 18   Ht 5\' 4"  (1.626 m)   Wt 70.3 kg   SpO2 97%   BMI 26.61 kg/m  Gen:   Awake, no distress   Resp:  Normal effort  MSK:   Moves extremities without difficulty  Other:  No proptosis to auricle or mastoid tenderness bilaterally.  Cerumen impaction noted bilaterally.  Medical Decision Making  Medically screening exam initiated at 5:39 PM.  Appropriate orders placed.  [name] was informed that the remainder of the evaluation will be completed by another provider, this initial triage assessment does not replace that evaluation, and the importance of remaining in the ED until their evaluation is complete.     [name], PA-C 11/09/20 1741    11/11/20, MD 11/09/20 2209

## 2020-11-09 NOTE — ED Triage Notes (Addendum)
Pt reports headache ongoing for several days,  hx HA with HTN. Pt denies acute changes to vision but endorses ongoing blurred vision. Pt reports Left otalgia new this week, denies cough, fever, chills.

## 2020-11-09 NOTE — Discharge Instructions (Addendum)
Please read and follow all provided instructions.  Your diagnoses today include:  1. Left ear pain   2. Acute nonintractable headache, unspecified headache type   3. Blurry vision     Tests performed today include: Complete blood cell count: mild low red blood cell count otherwise okay Basic metabolic panel:  Vital signs. See below for your results today.   Medications prescribed:  None  Take any prescribed medications only as directed.  Home care instructions:  Follow any educational materials contained in this packet.  BE VERY CAREFUL not to take multiple medicines containing Tylenol (also called acetaminophen). Doing so can lead to an overdose which can damage your liver and cause liver failure and possibly death.   Follow-up instructions: Please follow-up with your primary care provider and the eye doctor listed for further evaluation of your symptoms tonight  Return instructions:  Please return to the Emergency Department if you experience worsening symptoms.  Please return if you have any other emergent concerns.  Additional Information:  Your vital signs today were: BP (!) 187/83 (BP Location: Right Arm)   Pulse 65   Temp 98.4 F (36.9 C) (Oral)   Resp 20   Ht 5\' 4"  (1.626 m)   Wt 70.3 kg   SpO2 98%   BMI 26.61 kg/m  If your blood pressure (BP) was elevated above 135/85 this visit, please have this repeated by your doctor within one month. --------------

## 2020-11-16 ENCOUNTER — Emergency Department (HOSPITAL_COMMUNITY)
Admission: EM | Admit: 2020-11-16 | Discharge: 2020-11-17 | Disposition: A | Discharge status: Home / Self Care | Payer: Medicare Other | Attending: Emergency Medicine | Admitting: Emergency Medicine

## 2020-11-16 ENCOUNTER — Other Ambulatory Visit: Payer: Self-pay

## 2020-11-16 DIAGNOSIS — Z79899 Other long term (current) drug therapy: Secondary | ICD-10-CM | POA: Diagnosis not present

## 2020-11-16 DIAGNOSIS — W1839XA Other fall on same level, initial encounter: Secondary | ICD-10-CM | POA: Diagnosis not present

## 2020-11-16 DIAGNOSIS — I10 Essential (primary) hypertension: Secondary | ICD-10-CM | POA: Insufficient documentation

## 2020-11-16 DIAGNOSIS — S0081XA Abrasion of other part of head, initial encounter: Secondary | ICD-10-CM | POA: Insufficient documentation

## 2020-11-16 DIAGNOSIS — S022XXA Fracture of nasal bones, initial encounter for closed fracture: Secondary | ICD-10-CM | POA: Insufficient documentation

## 2020-11-16 DIAGNOSIS — M25512 Pain in left shoulder: Secondary | ICD-10-CM | POA: Diagnosis not present

## 2020-11-16 DIAGNOSIS — E119 Type 2 diabetes mellitus without complications: Secondary | ICD-10-CM | POA: Diagnosis not present

## 2020-11-16 DIAGNOSIS — R0602 Shortness of breath: Secondary | ICD-10-CM | POA: Insufficient documentation

## 2020-11-16 DIAGNOSIS — Z7902 Long term (current) use of antithrombotics/antiplatelets: Secondary | ICD-10-CM | POA: Insufficient documentation

## 2020-11-16 DIAGNOSIS — R079 Chest pain, unspecified: Secondary | ICD-10-CM | POA: Diagnosis not present

## 2020-11-16 DIAGNOSIS — W19XXXA Unspecified fall, initial encounter: Secondary | ICD-10-CM

## 2020-11-16 DIAGNOSIS — Z7984 Long term (current) use of oral hypoglycemic drugs: Secondary | ICD-10-CM | POA: Insufficient documentation

## 2020-11-16 DIAGNOSIS — Z7982 Long term (current) use of aspirin: Secondary | ICD-10-CM | POA: Insufficient documentation

## 2020-11-16 DIAGNOSIS — S0992XA Unspecified injury of nose, initial encounter: Secondary | ICD-10-CM | POA: Diagnosis present

## 2020-11-16 MED ORDER — ACETAMINOPHEN 325 MG PO TABS
325.0000 mg | ORAL_TABLET | Freq: Once | ORAL | Status: AC
  Administered 2020-11-17: 325 mg via ORAL
  Filled 2020-11-16: qty 1

## 2020-11-16 NOTE — ED Notes (Signed)
Patient came back after sustaining a fall trying to walk to the bus stop. An employee from inpatient helped her walk back in and informed us of what happened. Charge RN aware and vitals taken. Safety zone completed.

## 2020-11-16 NOTE — ED Notes (Signed)
Patient left without being seen due to wait times  °

## 2020-11-16 NOTE — ED Provider Notes (Signed)
Emergency Medicine Provider Triage Evaluation Note  [name] , a 75 y.o. female  was evaluated in triage.  Pt complains of headache, bilateral leg pain and left shoulder pain.  Patient unable to give me a clear timeline for the symptoms.  She states that she had a fall earlier this year in which she was seen in the emergency department, she believes that her pain is persisted since then.  She also voices concerns about getting food.  Review of Systems  Positive: Headache, leg pain, left shoulder pain Negative: Fevers, chills, chest pain, shortness of breath  Physical Exam  BP (!) 148/69 (BP Location: Left Arm)   Pulse 66   Temp 98.3 F (36.8 C) (Oral)   Resp 16   Ht 5\' 3"  (1.6 m)   Wt 70.3 kg   SpO2 99%   BMI 27.46 kg/m  Gen:   Awake, no distress   Resp:  Normal effort  MSK:   Moves extremities without difficulty  Other:    Medical Decision Making  Medically screening exam initiated at 8:21 PM.  Appropriate orders placed.  [name] was informed that the remainder of the evaluation will be completed by another provider, this initial triage assessment does not replace that evaluation, and the importance of remaining in the ED until their evaluation is complete.  Will provide medicine for headache, as injuries were several months ago with negative imaging, not considering repeating imaging   [name], PA-C 11/16/20 2023    2024, MD 11/16/20 2239

## 2020-11-16 NOTE — ED Triage Notes (Signed)
Pt c/o bilateral leg pain, left shoulder, and left sided chest pain. Pt states pain has been ongoing since she had a fall earlier this year.

## 2020-11-17 ENCOUNTER — Emergency Department (HOSPITAL_COMMUNITY): Payer: Medicare Other

## 2020-11-17 DIAGNOSIS — S022XXA Fracture of nasal bones, initial encounter for closed fracture: Secondary | ICD-10-CM | POA: Diagnosis not present

## 2020-11-17 MED ORDER — HYDRALAZINE HCL 25 MG PO TABS
50.0000 mg | ORAL_TABLET | Freq: Two times a day (BID) | ORAL | Status: DC
  Filled 2020-11-17: qty 2

## 2020-11-17 MED ORDER — ASPIRIN EC 81 MG PO TBEC
81.0000 mg | DELAYED_RELEASE_TABLET | Freq: Every day | ORAL | Status: DC
  Filled 2020-11-17: qty 1

## 2020-11-17 MED ORDER — GABAPENTIN 300 MG PO CAPS
300.0000 mg | ORAL_CAPSULE | Freq: Three times a day (TID) | ORAL | Status: DC
  Filled 2020-11-17: qty 1

## 2020-11-17 MED ORDER — METFORMIN HCL 500 MG PO TABS
1000.0000 mg | ORAL_TABLET | Freq: Two times a day (BID) | ORAL | Status: DC
  Filled 2020-11-17: qty 2

## 2020-11-17 MED ORDER — LOSARTAN POTASSIUM 50 MG PO TABS
50.0000 mg | ORAL_TABLET | Freq: Every day | ORAL | Status: DC
  Filled 2020-11-17: qty 1

## 2020-11-17 MED ORDER — CHLORTHALIDONE 25 MG PO TABS
25.0000 mg | ORAL_TABLET | Freq: Every day | ORAL | Status: DC
  Filled 2020-11-17: qty 1

## 2020-11-17 NOTE — ED Provider Notes (Signed)
[facility] EMERGENCY DEPARTMENT Provider Note   CSN: [id number] Arrival date & time: 11/16/20  1732     History Chief Complaint  Patient presents with   Chest Pain   Leg Pain    [name] is a 75 y.o. female.   Chest Pain Associated symptoms: shortness of breath   Associated symptoms: no weakness   Leg Pain Patient presents with multiple complaints.  Chest pain leg pain facial pain after fall.  Left shoulder pain.  Had been waiting to be seen.  Then fell on the way to the bus stop.  Brought back in.  Had imaging done.  This is her 32nd visit in the last 6 months.  Has had extensive work-up for the similar complaints.  Recent CT scans pain is in her left shoulder.  This is a typical pain she has been having.  States she needs a prescription for some diabetic shoes because her feet hurt her also.  Had told triage that she was having issues getting food.  Patient lives alone and does not want to.  Reviewing records it appears as if transitions of care has been having difficulty getting a hold of her.    Past Medical History:  Diagnosis Date   Diabetes mellitus without complication (HCC)    GERD 09/04/2006   Qualifier: Diagnosis of  By: Duke Salvia     History of echocardiogram    a. 2D ECHO: 11/06/2013 EF 65%; no WMA. Mild TR. 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   Numbness 06/20/2012   Diabetes mellitus (HCC) 06/20/2012   CVA (cerebral infarction) 06/20/2012   Hypokalemia 06/20/2012   ARTHRALGIA 01/28/2009   CHEST PAIN  UNSPECIFIED 11/26/2008   DEPRESSION 09/04/2006   Essential hypertension 09/04/2006   GERD 09/04/2006   MULTIPLE SITES, ARTHRITIS-ACUTE/CHRONIC 09/04/2006   LOW BACK PAIN, CHRONIC 09/04/2006    Past Surgical History:  Procedure Laterality Date   ABDOMINAL HYSTERECTOMY     ANTERIOR CERVICAL DECOMPRESSION/DISCECTOMY FUSION 4 LEVELS N/A 11/11/2019   Procedure: Cervical three-four Cervical four-five Cervical five-six Cervical six-seven  Anterior cervical decompression/discectomy/fusion;  Surgeon: Maeola Harman, MD;  Location: [facility] OR;  Service: Neurosurgery;  Laterality: N/A;   LEFT HEART CATHETERIZATION WITH CORONARY ANGIOGRAM N/A 11/05/2013   Procedure: LEFT HEART CATHETERIZATION WITH CORONARY ANGIOGRAM;  Surgeon: Lennette Bihari, MD;  Location: [facility] CATH LAB;  Service: Cardiovascular;  Laterality: N/A;     OB History   No obstetric history on file.     No family history on file.  Social History   Tobacco Use   Smoking status: Never   Smokeless tobacco: Never  Vaping Use   Vaping Use: Never used  Substance Use Topics   Alcohol use: No   Drug use: No    Home Medications Prior to Admission medications   Medication Sig Start Date End Date Taking? Authorizing Provider  albuterol (VENTOLIN HFA) 108 (90 Base) MCG/ACT inhaler Inhale 1-2 puffs into the lungs every 6 (six) hours as needed for wheezing or shortness of breath. 04/22/20  Barbette Merino, NP  aspirin EC 81 MG tablet Take 81 mg by mouth daily. Swallow whole.    [provider]  chlorthalidone (HYGROTON) 25 MG tablet Take 1 tablet (25 mg total) by mouth daily. 10/05/20 10/05/21  Barbette Merino, NP  clopidogrel (PLAVIX) 75 MG tablet Take 1 tablet (75 mg total) by mouth daily. 04/22/20   Barbette Merino, NP  diclofenac Sodium (VOLTAREN) 1 % GEL Apply 2 g topically 4 (four) times daily as needed. 07/27/20   Tilden Fossa, MD  diphenhydramine-acetaminophen (TYLENOL PM) 25-500 MG TABS tablet Take 1 tablet by mouth at bedtime.     [provider]  gabapentin (NEURONTIN) 300 MG capsule Take 1 capsule (300 mg total) by mouth 3 (three) times daily. 10/05/20   Barbette Merino, NP  gatifloxacin (ZYMAXID) 0.5 % SOLN Place 1 drop into the right eye 4 (four) times daily. 06/28/20   [provider]  glipiZIDE (GLUCOTROL) 5 MG tablet Take 1 tablet (5 mg total) by mouth 2 (two) times daily before a meal. 04/22/20 04/22/21  Barbette Merino, NP  hydrALAZINE (APRESOLINE) 50 MG tablet Take 1 tablet (50 mg total) by mouth 2 (two) times daily. 04/22/20   Barbette Merino, NP  ketorolac (ACULAR) 0.5 % ophthalmic solution Place 1 drop into the right eye 4 (four) times daily. 06/28/20   [provider]  lidocaine (LIDODERM) 5 % Place 1 patch onto the skin daily. Remove & Discard patch within 12 hours or as directed by MD    [provider]  losartan (COZAAR) 50 MG tablet Take 1 tablet (50 mg total) by mouth daily. 10/05/20 10/05/21  Barbette Merino, NP  metFORMIN (GLUCOPHAGE) 500 MG tablet Take 2 tablets (1,000 mg total) by mouth 2 (two) times daily with a meal. 10/05/20 10/05/21  Barbette Merino, NP  mirabegron ER (MYRBETRIQ) 25 MG TB24 tablet Take 25 mg by mouth daily.    [provider]  [facility] VERIO test strip Use 1-4 times daily as directed/needed   DX E11.9 Patient not taking: No sig reported 04/25/20   Barbette Merino, NP  potassium chloride (KLOR-CON) 10 MEQ tablet Take 10 mEq by mouth daily.    [provider]  prednisoLONE acetate (PRED FORTE) 1 % ophthalmic suspension Place 1 drop into the right eye 4 (four) times daily. 06/28/20   [provider]  rosuvastatin (CRESTOR) 10 MG tablet Take 1 tablet (10 mg total) by mouth at bedtime. 10/05/20 10/05/21  Barbette Merino, NP    Allergies    Codeine, Ibuprofen, and Imdur [isosorbide nitrate]  Review of Systems   Review of Systems  Constitutional:  Negative for appetite change.  Respiratory:  Positive for shortness of breath.    Cardiovascular:  Positive for chest pain.  Genitourinary:  Negative for flank pain.  Musculoskeletal:        Left shoulder pain.  Bilateral leg and foot pain.  Skin:  Positive for wound.  Neurological:  Negative for weakness.  Psychiatric/Behavioral:  Negative for confusion.    Physical Exam Updated Vital Signs BP (!) 199/76 (BP Location: Right Arm)   Pulse 68   Temp 98.1 F (36.7 C) (Oral)   Resp 16   Ht 5\' 3"  (1.6 m)   Wt 70.3 kg   SpO2 100%   BMI 27.46 kg/m   Physical Exam Vitals and nursing note reviewed.  HENT:     Head:     Comments: Abrasion to bridge of  nose with some swelling.  Also abrasion to jaw. Cardiovascular:     Rate and Rhythm: Normal rate and regular rhythm.  Pulmonary:     Breath sounds: No wheezing, rhonchi or rales.  Chest:     Chest wall: No tenderness.  Abdominal:     Tenderness: There is no abdominal tenderness.  Musculoskeletal:     Cervical back: Neck supple.  Skin:    Capillary Refill: Capillary refill takes less than 2 seconds.     Findings: No rash.  Neurological:     Mental Status: She is alert and oriented to person, place, and time.    ED Results / Procedures / Treatments   Labs (all labs ordered are listed, but only abnormal results are displayed) Labs Reviewed - No data to display  EKG None  Radiology CT Cervical Spine Wo Contrast  Result Date: 11/17/2020 CLINICAL DATA:  Status post trauma. EXAM: CT CERVICAL SPINE WITHOUT CONTRAST TECHNIQUE: Multidetector CT imaging of the cervical spine was performed without intravenous contrast. Multiplanar CT image reconstructions were also generated. COMPARISON:  October 12, 2019 FINDINGS: Alignment: Normal. Skull base and vertebrae: No acute fracture. A metallic density anterior fusion plate and screws are seen at the levels of C3, C4, C5, C6 and C7. This represents a new finding when compared to the prior study. Soft tissues and spinal canal: No prevertebral fluid or swelling. No  visible canal hematoma. Disc levels: Cervical fusion is seen from the level of C3 through C7 with postoperative changes noted within the intervertebral disc spaces. Mild to moderate severity bilateral multilevel facet joint hypertrophy is noted. Upper chest: Negative. Other: None. IMPRESSION: 1. Status post interval anterior fusion from the level of C3 through C7. 2. Mild to moderate severity bilateral multilevel facet joint hypertrophy. 3. No evidence of an acute fracture or subluxation. Electronically Signed   By: Aram Candela M.D.   On: 11/17/2020 01:52   CT Maxillofacial Wo Contrast  Result Date: 11/17/2020 CLINICAL DATA:  Status post trauma. EXAM: CT MAXILLOFACIAL WITHOUT CONTRAST TECHNIQUE: Multidetector CT imaging of the maxillofacial structures was performed. Multiplanar CT image reconstructions were also generated. COMPARISON:  None. FINDINGS: Osseous: A mildly depressed fracture deformity is seen along the tip of the nasal bone. Orbits: Negative. No traumatic or inflammatory finding. Sinuses: Clear. Soft tissues: Mild frontal scalp soft tissue swelling is seen along the midline. Limited intracranial: No significant or unexpected finding. Other: Postoperative changes are seen within the visualized portion of the cervical spine. IMPRESSION: Mildly depressed fracture deformity along the tip of the nasal bone. Electronically Signed   By: Aram Candela M.D.   On: 11/17/2020 01:54    Procedures Procedures   Medications Ordered in ED Medications  chlorthalidone (HYGROTON) tablet 25 mg (25 mg Oral Not Given 11/17/20 0807)  metFORMIN (GLUCOPHAGE) tablet 1,000 mg (1,000 mg Oral Not Given 11/17/20 0807)  losartan (COZAAR) tablet 50 mg (50 mg Oral Not Given 11/17/20 0807)  hydrALAZINE (APRESOLINE) tablet 50 mg (50 mg Oral Not Given 11/17/20 0808)  gabapentin (NEURONTIN) capsule 300 mg (300 mg Oral Not Given 11/17/20 0808)  aspirin EC tablet 81 mg (81 mg Oral Not Given 11/17/20 0808)   acetaminophen (TYLENOL) tablet 325 mg (325 mg Oral Given 11/17/20 1540)    ED Course  I have reviewed the triage vital signs and the nursing notes.  Pertinent labs & imaging results that were available during my care of the patient were reviewed by me and considered in my medical decision making (see  chart for details).    MDM Rules/Calculators/A&P                           Patient presents with many of her typical plaints.  Frequent visits for same.  Chest pain shoulder pain leg pain.  Had left before complete evaluation initially and had a fall outside.  Reportedly hit face.  Does have a small nasal bone fracture.  Can follow-up as an outpatient.  No septal hematoma.  PCP can follow-up with other complaints.  Doubt acute cardiac cause.  Has had transitions of care trying to follow-up with her as an outpatient.  Seen here and does not need acute intervention here but they will also contact primary transitional care to help arrange more follow-up.  Will discharge home. Final Clinical Impression(s) / ED Diagnoses Final diagnoses:  Fall, initial encounter  Closed fracture of nasal bone, initial encounter    Rx / DC Orders ED Discharge Orders     None        Benjiman Core, MD 11/17/20 1022

## 2020-11-17 NOTE — Discharge Planning (Signed)
RNCM met with pt at bedside to discuss disposition needs.  Pt states she has a rolling walker to assist with gait difficulty but decided to leave it home.  Pt states that she has been in contact with her PCP office and does not have additional disposition needs.  RNCM reached out to [facility] outreach RN, Lattie Haw.

## 2020-11-17 NOTE — Discharge Instructions (Signed)
Follow-up with ENT as needed for the nasal fracture.  Follow-up with your doctor for other chronic pains in your arms chest and legs.  The transition of care people should be following up also.

## 2020-11-24 ENCOUNTER — Emergency Department (HOSPITAL_COMMUNITY): Payer: Medicare Other

## 2020-11-24 ENCOUNTER — Emergency Department (HOSPITAL_COMMUNITY)
Admission: EM | Admit: 2020-11-24 | Discharge: 2020-11-25 | Disposition: A | Discharge status: Home / Self Care | Payer: Medicare Other | Attending: Emergency Medicine | Admitting: Emergency Medicine

## 2020-11-24 DIAGNOSIS — M79641 Pain in right hand: Secondary | ICD-10-CM | POA: Insufficient documentation

## 2020-11-24 DIAGNOSIS — M79643 Pain in unspecified hand: Secondary | ICD-10-CM

## 2020-11-24 DIAGNOSIS — I10 Essential (primary) hypertension: Secondary | ICD-10-CM | POA: Diagnosis not present

## 2020-11-24 DIAGNOSIS — R519 Headache, unspecified: Secondary | ICD-10-CM | POA: Diagnosis not present

## 2020-11-24 DIAGNOSIS — M79672 Pain in left foot: Secondary | ICD-10-CM | POA: Diagnosis not present

## 2020-11-24 DIAGNOSIS — M79642 Pain in left hand: Secondary | ICD-10-CM | POA: Diagnosis not present

## 2020-11-24 DIAGNOSIS — M79671 Pain in right foot: Secondary | ICD-10-CM | POA: Diagnosis not present

## 2020-11-24 DIAGNOSIS — E119 Type 2 diabetes mellitus without complications: Secondary | ICD-10-CM | POA: Diagnosis not present

## 2020-11-24 DIAGNOSIS — M79673 Pain in unspecified foot: Secondary | ICD-10-CM

## 2020-11-24 LAB — BASIC METABOLIC PANEL
Anion gap: 9 (ref 5–15)
BUN: 14 mg/dL (ref 8–23)
CO2: 26 mmol/L (ref 22–32)
Calcium: 8.5 mg/dL — ABNORMAL LOW (ref 8.9–10.3)
Chloride: 102 mmol/L (ref 98–111)
Creatinine, Ser: 1.09 mg/dL — ABNORMAL HIGH (ref 0.44–1.00)
GFR, Estimated: 54 mL/min — ABNORMAL LOW (ref >60)
Glucose, Bld: 109 mg/dL — ABNORMAL HIGH (ref 70–99)
Potassium: 3.7 mmol/L (ref 3.5–5.1)
Sodium: 137 mmol/L (ref 135–145)

## 2020-11-24 LAB — CBC
HCT: 34.2 % — ABNORMAL LOW (ref 36.0–46.0)
Hemoglobin: 10.9 g/dL — ABNORMAL LOW (ref 12.0–15.0)
MCH: 28.9 pg (ref 26.0–34.0)
MCHC: 31.9 g/dL (ref 30.0–36.0)
MCV: 90.7 fL (ref 80.0–100.0)
Platelets: 223 10*3/uL (ref 150–400)
RBC: 3.77 MIL/uL — ABNORMAL LOW (ref 3.87–5.11)
RDW: 12.5 % (ref 11.5–15.5)
WBC: 8.1 10*3/uL (ref 4.0–10.5)
nRBC: 0 % (ref 0.0–0.2)

## 2020-11-24 LAB — TROPONIN I (HIGH SENSITIVITY)
Troponin I (High Sensitivity): 6 ng/L (ref <18)
Troponin I (High Sensitivity): 6 ng/L (ref <18)

## 2020-11-24 NOTE — ED Triage Notes (Signed)
Pt c/o generalized pain x "awhile," points to bilateral legs, chest; states length of time can be found "in the records." Pt ambulatory to triage, states she fell last week & broke nose, also complaining of continued pain there.

## 2020-11-24 NOTE — Discharge Instructions (Signed)
You were evaluated in the Emergency Department and after careful evaluation, we did not find any emergent condition requiring admission or further testing in the hospital.  Your exam/testing today was overall reassuring.  Recommend follow-up with your primary care doctor to discuss your symptoms.  Please return to the Emergency Department if you experience any worsening of your condition.  Thank you for allowing us to be a part of your care.  

## 2020-11-24 NOTE — ED Provider Notes (Signed)
Emergency Medicine Provider Triage Evaluation Note  [name] , a 75 y.o. female  was evaluated in triage.  Pt presents with multiple complaints. She is complaining of headache, bilateral hand pain and bilateral foot pain. She states she fell getting off the bus last week and "broke her nose". Upon chart review she has had extensive workup for this that was negative. Of note, this is her 33rd ER visit in the last 6 months.   Review of Systems  Positive: Headache, hand pain, foot pain, intermittent chest pain Negative: SOB, weakness  Physical Exam  BP (!) 198/74 (BP Location: Left Arm)   Pulse 78   Temp 98.5 F (36.9 C) (Oral)   Resp 16   SpO2 96%  Gen:   Awake, no distress   Resp:  Normal effort  MSK:   Moves extremities without difficulty  Other:    Medical Decision Making  Medically screening exam initiated at 6:27 PM.  Appropriate orders placed.  [name] was informed that the remainder of the evaluation will be completed by another provider, this initial triage assessment does not replace that evaluation, and the importance of remaining in the ED until their evaluation is complete.     [name] 11/24/20 1830    Terald Sleeper, MD 11/24/20 [phone]

## 2020-11-24 NOTE — ED Provider Notes (Addendum)
[facility]-EMERGENCY DEPT [facility] Emergency Department Provider Note MRN:  [id number]  Arrival date & time: 11/24/20     Chief Complaint   Multiple Complaints   History of Present Illness   [name] is a 75 y.o. year-old female with a history of diabetes, presenting to the ED with chief complaint of multiple complaints.  Patient here because her blood pressure was high earlier today.  Headache was gradual onset dull frontal headache at about 11 AM which spontaneously resolved.  Currently with no headache, no numbness or weakness to the arms or legs.  Continues to have pain to the bilateral hands and feet which has been present for several months, thinks it is the neuropathy.  Denies chest pain or shortness of breath, no abdominal pain, no vision change, no speech change, no other complaints.  Review of Systems  A complete 10 system review of systems was obtained and all systems are negative except as noted in the HPI and PMH.   Patient's Health History    Past Medical History:  Diagnosis Date   Diabetes mellitus without complication (HCC)    GERD 09/04/2006   Qualifier: Diagnosis of  By: Duke Salvia     History of echocardiogram    a. 2D ECHO: 11/06/2013 EF 65%; no WMA. Mild TR. PA pk pressure 43 mm HG   Hypertension    Stroke [facility])     Past Surgical History:  Procedure Laterality Date   ABDOMINAL HYSTERECTOMY     ANTERIOR CERVICAL DECOMPRESSION/DISCECTOMY FUSION 4 LEVELS N/A 11/11/2019   Procedure: Cervical three-four Cervical four-five Cervical five-six Cervical six-seven  Anterior cervical decompression/discectomy/fusion;  Surgeon: Maeola Harman, MD;  Location: [facility] OR;  Service: Neurosurgery;  Laterality: N/A;   LEFT HEART CATHETERIZATION WITH CORONARY ANGIOGRAM N/A 11/05/2013   Procedure: LEFT HEART CATHETERIZATION WITH CORONARY ANGIOGRAM;  Surgeon: Lennette Bihari, MD;  Location: [facility] CATH LAB;  Service: Cardiovascular;  Laterality: N/A;    No family history on file.   Social History   Socioeconomic History   Marital status: Widowed    Spouse name: Not on file   Number of children: Not on file   Years of education: Not on file   Highest education level: Not on file  Occupational History   Not on file  Tobacco Use   Smoking status: Never   Smokeless tobacco: Never  Vaping Use   Vaping Use: Never used  Substance and Sexual Activity   Alcohol use: No   Drug use: No   Sexual activity: Not Currently  Other Topics Concern   Not on file  Social History Narrative   Not on file   Social Determinants of Health   Financial Resource Strain: Low Risk    Difficulty of Paying Living Expenses: Not hard at all  Food Insecurity: No Food Insecurity   Worried About Running Out of Food in the Last Year: Never true   Ran Out of Food in the Last Year: Never true  Transportation Needs: No Transportation Needs   Lack of Transportation (Medical): No   Lack of Transportation (Non-Medical): No  Physical Activity: Insufficiently Active   Days of Exercise per Week: 2 days   Minutes of Exercise per Session: 60 min  Stress: No Stress Concern Present   Feeling of Stress : Not at all  Social Connections: Socially Isolated   Frequency of Communication with Friends and Family: Three times a week   Frequency of Social Gatherings with Friends and Family: Never   Attends Religious  Services: Never   Active Member of Clubs or Organizations: No   Attends Banker Meetings: Never   Marital Status: Widowed  Catering manager Violence: Not At Risk   Fear of Current or Ex-Partner: No   Emotionally Abused: No   Physically Abused: No   Sexually Abused: No     Physical Exam   Vitals:   11/24/20 1821 11/24/20 2142  BP: (!) 198/74 (!) 191/76  Pulse: 78 69  Resp: 16 18  Temp: 98.5 F (36.9 C) 98.2 F (36.8 C)  SpO2: 96% 98%    CONSTITUTIONAL: Well-appearing, NAD NEURO:  Alert and oriented x 3, normal and symmetric strength and sensation, normal  coordination, normal speech EYES:  eyes equal and reactive ENT/NECK:  no LAD, no JVD CARDIO: Regular rate, well-perfused, normal S1 and S2 PULM:  CTAB no wheezing or rhonchi GI/GU:  normal bowel sounds, non-distended, non-tender MSK/SPINE:  No gross deformities, no edema SKIN:  no rash, atraumatic PSYCH:  Appropriate speech and behavior  *Additional and/or pertinent findings included in MDM below  Diagnostic and Interventional Summary    EKG Interpretation  Date/Time:    Ventricular Rate:    PR Interval:    QRS Duration:   QT Interval:    QTC Calculation:   R Axis:     Text Interpretation:         Labs Reviewed  BASIC METABOLIC PANEL - Abnormal; Notable for the following components:      Result Value   Glucose, Bld 109 (*)    Creatinine, Ser 1.09 (*)    Calcium 8.5 (*)    GFR, Estimated 54 (*)    All other components within normal limits  CBC - Abnormal; Notable for the following components:   RBC 3.77 (*)    Hemoglobin 10.9 (*)    HCT 34.2 (*)    All other components within normal limits  TROPONIN I (HIGH SENSITIVITY)  TROPONIN I (HIGH SENSITIVITY)    DG Chest 2 View  Final Result      Medications - No data to display   Procedures  /  Critical Care Procedures  ED Course and Medical Decision Making  I have reviewed the triage vital signs, the nursing notes, and pertinent available records from the EMR.  Listed above are laboratory and imaging tests that I personally ordered, reviewed, and interpreted and then considered in my medical decision making (see below for details).  Headache that has resolved, hypertensive but this seems to be her baseline.  Discussed the importance of PCP follow-up but she explains that she prefers the emergency department for her care.  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  Nothing to suggest emergent process, strongly encouraged PCP follow-up.    Update 12 AM: Patient  also wants help with her vision.  Explains that she saw an eye doctor and they gave her some drops but they did not really help.  She has had poor vision for several months and wants some new glasses.  Normal extraocular movements, no visual field cuts on exam, visual acuity is quite poor, 20/100 on my exam.  Has been like this for a long time.  Explains that the glasses they gave her do not work.  I explained that we do not provide glasses here in the emergency department, she will need to follow-up with her eye doctor.  She expresses an inability or lack of confidence in being able to set up  this follow-up on her own.  Suspect she will return to the emergency department soon and ask for case management or social work to help her with this issue.   Elmer Sow. Pilar Plate, MD [facility] Emergency Medicine [facility] [email or web address]  Final Clinical Impressions(s) / ED Diagnoses     ICD-10-CM   1. Nonintractable headache, unspecified chronicity pattern, unspecified headache type  R51.9     2. Hand and foot pain, unspecified laterality  M79.643    [id number]       ED Discharge Orders     None        Discharge Instructions Discussed with and Provided to Patient:    Discharge Instructions      You were evaluated in the Emergency Department and after careful evaluation, we did not find any emergent condition requiring admission or further testing in the hospital.  Your exam/testing today was overall reassuring.  Recommend follow-up with your primary care doctor to discuss your symptoms.  Please return to the Emergency Department if you experience any worsening of your condition.  Thank you for allowing [place] to be a part of your care.        Sabas Sous, MD 11/24/20 2345    Sabas Sous, MD 11/25/20 0001

## 2020-12-01 ENCOUNTER — Other Ambulatory Visit: Payer: Self-pay

## 2020-12-01 ENCOUNTER — Emergency Department (HOSPITAL_COMMUNITY)
Admission: EM | Admit: 2020-12-01 | Discharge: 2020-12-02 | Disposition: A | Discharge status: Home / Self Care | Payer: Medicare Other | Attending: Emergency Medicine | Admitting: Emergency Medicine

## 2020-12-01 ENCOUNTER — Encounter (HOSPITAL_COMMUNITY): Payer: Self-pay | Admitting: Emergency Medicine

## 2020-12-01 ENCOUNTER — Emergency Department (HOSPITAL_COMMUNITY): Payer: Medicare Other

## 2020-12-01 ENCOUNTER — Other Ambulatory Visit: Payer: Self-pay | Admitting: *Deleted

## 2020-12-01 DIAGNOSIS — Z7982 Long term (current) use of aspirin: Secondary | ICD-10-CM | POA: Insufficient documentation

## 2020-12-01 DIAGNOSIS — I158 Other secondary hypertension: Secondary | ICD-10-CM | POA: Insufficient documentation

## 2020-12-01 DIAGNOSIS — Z8673 Personal history of transient ischemic attack (TIA), and cerebral infarction without residual deficits: Secondary | ICD-10-CM | POA: Insufficient documentation

## 2020-12-01 DIAGNOSIS — Z7984 Long term (current) use of oral hypoglycemic drugs: Secondary | ICD-10-CM | POA: Insufficient documentation

## 2020-12-01 DIAGNOSIS — R519 Headache, unspecified: Secondary | ICD-10-CM

## 2020-12-01 DIAGNOSIS — Z7901 Long term (current) use of anticoagulants: Secondary | ICD-10-CM | POA: Diagnosis not present

## 2020-12-01 DIAGNOSIS — Z79899 Other long term (current) drug therapy: Secondary | ICD-10-CM | POA: Diagnosis not present

## 2020-12-01 DIAGNOSIS — E119 Type 2 diabetes mellitus without complications: Secondary | ICD-10-CM | POA: Insufficient documentation

## 2020-12-01 LAB — CBC WITH DIFFERENTIAL/PLATELET
Abs Immature Granulocytes: 0.01 10*3/uL (ref 0.00–0.07)
Basophils Absolute: 0 10*3/uL (ref 0.0–0.1)
Basophils Relative: 0 %
Eosinophils Absolute: 0.1 10*3/uL (ref 0.0–0.5)
Eosinophils Relative: 1 %
HCT: 33.5 % — ABNORMAL LOW (ref 36.0–46.0)
Hemoglobin: 10.6 g/dL — ABNORMAL LOW (ref 12.0–15.0)
Immature Granulocytes: 0 %
Lymphocytes Relative: 21 %
Lymphs Abs: 1.5 10*3/uL (ref 0.7–4.0)
MCH: 28.9 pg (ref 26.0–34.0)
MCHC: 31.6 g/dL (ref 30.0–36.0)
MCV: 91.3 fL (ref 80.0–100.0)
Monocytes Absolute: 0.5 10*3/uL (ref 0.1–1.0)
Monocytes Relative: 7 %
Neutro Abs: 5.1 10*3/uL (ref 1.7–7.7)
Neutrophils Relative %: 71 %
Platelets: 251 10*3/uL (ref 150–400)
RBC: 3.67 MIL/uL — ABNORMAL LOW (ref 3.87–5.11)
RDW: 12.3 % (ref 11.5–15.5)
WBC: 7.2 10*3/uL (ref 4.0–10.5)
nRBC: 0 % (ref 0.0–0.2)

## 2020-12-01 LAB — BASIC METABOLIC PANEL
Anion gap: 8 (ref 5–15)
BUN: 12 mg/dL (ref 8–23)
CO2: 24 mmol/L (ref 22–32)
Calcium: 8.4 mg/dL — ABNORMAL LOW (ref 8.9–10.3)
Chloride: 104 mmol/L (ref 98–111)
Creatinine, Ser: 0.88 mg/dL (ref 0.44–1.00)
GFR, Estimated: >60 mL/min (ref >60)
Glucose, Bld: 124 mg/dL — ABNORMAL HIGH (ref 70–99)
Potassium: 3.4 mmol/L — ABNORMAL LOW (ref 3.5–5.1)
Sodium: 136 mmol/L (ref 135–145)

## 2020-12-01 NOTE — ED Provider Notes (Signed)
Emergency Medicine Provider Triage Evaluation Note  [name] , a 75 y.o. female  was evaluated in triage.  Pt complains of headache and chest pain for 1 week.  She states the chest pain is worse when it is touched.  She denies focal weakness or paresthesia at this time.  She was evaluated for headache, 7 days ago in the ED.  She did not have intracranial imaging at that time.  She is known hypertensive.  She has a history of diabetes, CVA, palpitations and NSTEMI.  She has had numerous ED evaluations in the last several months..  Review of Systems  Positive: Headache, chest pain Negative: No shortness of breath, nausea or vomiting.  Physical Exam  BP (!) 195/78 (BP Location: Right Arm)   Pulse 72   Temp 98.6 F (37 C)   Resp 18   SpO2 98%  Gen:   Awake, no distress   Resp:  Normal effort MSK:   Moves extremities without difficulty Other:  Left anterior chest pain is moderate to light touch  Medical Decision Making  Medically screening exam initiated at 5:57 PM.  Appropriate orders placed.  Nesiah Jump was informed that the remainder of the evaluation will be completed by another provider, this initial triage assessment does not replace that evaluation, and the importance of remaining in the ED until their evaluation is complete.  Screening evaluation ordered, imaging and labs.  I anticipate that she can be discharged home after that.   Mancel Bale, MD 12/01/20 [phone]

## 2020-12-01 NOTE — ED Triage Notes (Signed)
Pt here for cp and HA since last week, pt c/o L side cp, pt states that was the side she had a "light stroke on". Pt thinks its her BP, states she takes her meds every day.

## 2020-12-01 NOTE — Patient Outreach (Signed)
[facility]) Care Management  12/01/2020  [name] 09-Oct-1945 [id number]   Case closure  RN has made several outreach call to pt over the course of 2 months with no success. RN call all contacts today however not able to leave a message.    Based upon the unsuccessful outreach will close this case and notify the provider of pt's disposition with [facility] services.  Elliot Cousin, RN Care Management Coordinator [facility] Main Office [phone]

## 2020-12-02 DIAGNOSIS — I158 Other secondary hypertension: Secondary | ICD-10-CM | POA: Diagnosis not present

## 2020-12-02 MED ORDER — HYDRALAZINE HCL 25 MG PO TABS
50.0000 mg | ORAL_TABLET | Freq: Once | ORAL | Status: AC
  Administered 2020-12-02: 50 mg via ORAL
  Filled 2020-12-02: qty 2

## 2020-12-02 MED ORDER — CHLORTHALIDONE 25 MG PO TABS
25.0000 mg | ORAL_TABLET | Freq: Every day | ORAL | Status: DC
  Filled 2020-12-02: qty 1

## 2020-12-02 MED ORDER — ACETAMINOPHEN 500 MG PO TABS
1000.0000 mg | ORAL_TABLET | Freq: Once | ORAL | Status: AC
  Administered 2020-12-02: 1000 mg via ORAL
  Filled 2020-12-02: qty 2

## 2020-12-02 MED ORDER — LOSARTAN POTASSIUM 50 MG PO TABS
50.0000 mg | ORAL_TABLET | Freq: Once | ORAL | Status: AC
  Administered 2020-12-02: 50 mg via ORAL
  Filled 2020-12-02: qty 1

## 2020-12-02 NOTE — ED Provider Notes (Signed)
[facility] EMERGENCY DEPARTMENT Provider Note  CSN: [id number] Arrival date & time: 12/01/20 1724  Chief Complaint(s) Headache and Chest Pain  HPI [name] is a 75 y.o. female with a past medical history listed below including hypertension who presents to the emergency department with a generalized headache and elevated blood pressure.  Patient is familiar to me for prior ED visits for the same.  Patient reports that these have been ongoing for the past week.  She also reported left upper chest/shoulder pain worse with movement and palpation.  Nonexertional and nonradiating.  Currently chest pain-free.  She denies any recent fevers or infections.  No focal deficits.  No coughing or congestion.  No abdominal pain.  No shortness of breath.  Patient has been noncompliant with her medications in the past but reports that she has been taking her home medications as instructed. Has not taken anything for her headache.  The history is provided by the patient.   Past Medical History Past Medical History:  Diagnosis Date   Diabetes mellitus without complication ([place])    GERD 09/04/2006   Qualifier: Diagnosis of  By: Suzie Portela     History of echocardiogram    a. 2D ECHO: 11/06/2013 EF 65%; no WMA. Mild TR. 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 Authorizing Provider  albuterol (VENTOLIN HFA) 108 (90 Base) MCG/ACT inhaler Inhale 1-2 puffs into the lungs every 6 (six) hours as needed for wheezing or shortness of breath. 04/22/20   Vevelyn Francois, NP  aspirin EC 81 MG tablet Take 81 mg by mouth daily. Swallow whole.    [provider]  chlorthalidone (HYGROTON) 25 MG tablet Take 1 tablet (25 mg total) by mouth daily. 10/05/20 10/05/21  Vevelyn Francois, NP  clopidogrel (PLAVIX) 75 MG tablet Take 1 tablet (75 mg total) by mouth daily. 04/22/20   Vevelyn Francois, NP  diclofenac Sodium (VOLTAREN) 1 % GEL Apply 2 g topically 4 (four) times daily as needed. 07/27/20   Quintella Reichert, MD  diphenhydramine-acetaminophen (TYLENOL PM) 25-500 MG TABS tablet Take 1 tablet by mouth at bedtime.    [provider]  gabapentin (NEURONTIN) 300 MG capsule Take 1 capsule (300 mg total) by mouth 3 (three) times daily. 10/05/20   Vevelyn Francois, NP  gatifloxacin (ZYMAXID) 0.5 % SOLN Place 1 drop into the right eye 4 (four) times daily. 06/28/20   [provider]  glipiZIDE (GLUCOTROL) 5 MG tablet Take 1 tablet (5 mg total) by mouth 2 (two) times daily before a meal. 04/22/20 04/22/21  Vevelyn Francois, NP  hydrALAZINE (APRESOLINE) 50 MG  tablet Take 1 tablet (50 mg total) by mouth 2 (two) times daily. 04/22/20   Vevelyn Francois, NP  ketorolac (ACULAR) 0.5 % ophthalmic solution Place 1 drop into the right eye 4 (four) times daily. 06/28/20   [provider]  lidocaine (LIDODERM) 5 % Place 1 patch onto the skin daily. Remove & Discard patch within 12 hours or as directed by MD    [provider]  losartan (COZAAR) 50 MG tablet Take 1 tablet (50 mg total) by mouth daily. 10/05/20 10/05/21   Vevelyn Francois, NP  metFORMIN (GLUCOPHAGE) 500 MG tablet Take 2 tablets (1,000 mg total) by mouth 2 (two) times daily with a meal. 10/05/20 10/05/21  Vevelyn Francois, NP  mirabegron ER (MYRBETRIQ) 25 MG TB24 tablet Take 25 mg by mouth daily.    [provider]  [facility] VERIO test strip Use 1-4 times daily as directed/needed   DX E11.9 Patient not taking: No sig reported 04/25/20   Vevelyn Francois, NP  potassium chloride (KLOR-CON) 10 MEQ tablet Take 10 mEq by mouth daily.    [provider]  prednisoLONE acetate (PRED FORTE) 1 % ophthalmic suspension Place 1 drop into the right eye 4 (four) times daily. 06/28/20   [provider]  rosuvastatin (CRESTOR) 10 MG tablet Take 1 tablet (10 mg total) by mouth at bedtime. 10/05/20 10/05/21  Vevelyn Francois, NP                                                                                                                                    Past Surgical History Past Surgical History:  Procedure Laterality Date   ABDOMINAL HYSTERECTOMY     ANTERIOR CERVICAL DECOMPRESSION/DISCECTOMY FUSION 4 LEVELS N/A 11/11/2019   Procedure: Cervical three-four Cervical four-five Cervical five-six Cervical six-seven  Anterior cervical decompression/discectomy/fusion;  Surgeon: Erline Levine, MD;  Location: [facility];  Service: Neurosurgery;  Laterality: N/A;   LEFT HEART CATHETERIZATION WITH CORONARY ANGIOGRAM N/A 11/05/2013   Procedure: LEFT HEART CATHETERIZATION WITH CORONARY ANGIOGRAM;  Surgeon: Troy Sine, MD;  Location: [facility] CATH LAB;  Service: Cardiovascular;  Laterality: N/A;   Family History History reviewed. No pertinent family history.  Social History Social History   Tobacco Use   Smoking status: Never   Smokeless tobacco: Never  Vaping Use   Vaping Use: Never used  Substance Use Topics   Alcohol use: No   Drug use: No   Allergies Codeine, Ibuprofen, and Imdur [isosorbide nitrate]  Review of Systems Review of Systems All  other systems are reviewed and are negative for acute change except as noted in the HPI  Physical Exam Vital Signs  I have reviewed the triage vital signs BP (!) 143/52   Pulse 85   Temp 98.6 F (37 C) (Oral)   Resp 19   SpO2 99%   Physical Exam Vitals reviewed.  Constitutional:      General:  She is not in acute distress.    Appearance: She is well-developed. She is not diaphoretic.  HENT:     Head: Normocephalic and atraumatic.     Nose: Nose normal.  Eyes:     General: No scleral icterus.       Right eye: No discharge.        Left eye: No discharge.     Conjunctiva/sclera: Conjunctivae normal.     Pupils: Pupils are equal, round, and reactive to light.  Cardiovascular:     Rate and Rhythm: Normal rate and regular rhythm.     Heart sounds: No murmur heard.   No friction rub. No gallop.  Pulmonary:     Effort: Pulmonary effort is normal. No respiratory distress.     Breath sounds: Normal breath sounds. No stridor. No rales.  Abdominal:     General: There is no distension.     Palpations: Abdomen is soft.     Tenderness: There is no abdominal tenderness.  Musculoskeletal:        General: No tenderness.     Cervical back: Normal range of motion and neck supple.  Skin:    General: Skin is warm and dry.     Findings: No erythema or rash.  Neurological:     Mental Status: She is alert and oriented to person, place, and time.    ED Results and Treatments Labs (all labs ordered are listed, but only abnormal results are displayed) Labs Reviewed  BASIC METABOLIC PANEL - Abnormal; Notable for the following components:      Result Value   Potassium 3.4 (*)    Glucose, Bld 124 (*)    Calcium 8.4 (*)    All other components within normal limits  CBC WITH DIFFERENTIAL/PLATELET - Abnormal; Notable for the following components:   RBC 3.67 (*)    Hemoglobin 10.6 (*)    HCT 33.5 (*)    All other components within normal limits  RESP PANEL BY RT-PCR (FLU A&B, COVID) [id number]                                                                                                                          EKG  EKG Interpretation  Date/Time:  Thursday December 01 2020 18:01:04 EST Ventricular Rate:  72 PR Interval:  126 QRS Duration: 62 QT Interval:  388 QTC Calculation: 424 R Axis:   58 Text Interpretation: Normal sinus rhythm with sinus arrhythmia Cannot rule out Anterior infarct , age undetermined Abnormal ECG since last tracing no significant change Confirmed by Daleen Bo [phone]) on 12/01/2020 6:08:56 PM       Radiology CT Head Wo Contrast  Result Date: 12/01/2020 CLINICAL DATA:  Headache due to elevated blood pressure. EXAM: CT HEAD WITHOUT CONTRAST TECHNIQUE: Contiguous axial images were obtained from the base of the skull through the vertex without intravenous contrast. COMPARISON:  November 02, 2020 FINDINGS: Brain: No evidence of acute infarction, hemorrhage, hydrocephalus, extra-axial collection or  mass lesion/mass effect. Vascular: No hyperdense vessel. Atherosclerotic calcifications of the internal carotid arteries at skull base. Skull: Normal. Negative for fracture or focal lesion. Sinuses/Orbits: Visualized portions of the paranasal sinuses and mastoid air cells are predominantly clear. Orbits are unremarkable. Other: None. IMPRESSION: No acute intracranial findings. Electronically Signed   By: Dahlia Bailiff M.D.   On: 12/01/2020 19:21    Pertinent labs & imaging results that were available during my care of the patient were reviewed by me and considered in my medical decision making (see MDM for details).  Medications Ordered in ED Medications  acetaminophen (TYLENOL) tablet 1,000 mg (1,000 mg Oral Given 12/02/20 0540)  losartan (COZAAR) tablet 50 mg (50 mg Oral Given 12/02/20 0541)  hydrALAZINE (APRESOLINE) tablet 50 mg (50 mg Oral Given 12/02/20 0540)                                                                                                                                      Procedures Procedures  (including critical care time)  Medical Decision Making / ED Course I have reviewed the nursing notes for this encounter and the patient's prior records (if available in EHR or on provided paperwork).  Laquasha Navia was evaluated in Emergency Department on 12/02/2020 for the symptoms described in the history of present illness. She was evaluated in the context of the global COVID-19 pandemic, which necessitated consideration that the patient might be at risk for infection with the SARS-CoV-2 virus that causes COVID-19. Institutional protocols and algorithms that pertain to the evaluation of patients at risk for COVID-19 are in a state of rapid change based on information released by regulatory bodies including the CDC and federal and state organizations. These policies and algorithms were followed during the patient's care in the ED.     Headache Similar to prior headaches related to elevated blood pressures. She has systolics in the [id number]. Given her home medications and Tylenol. Pain completely resolved and blood pressures down to the 140s.  Chest discomfort. Currently asymptomatic. Pain is most consistent with MSK. EKG without acute ischemic changes or evidence of pericarditis. Troponin is negative. Doubt ACS, PE, dissection. Chest x-ray without evidence suggestive of pneumonia, pneumothorax, pneumomediastinum.  No abnormal contour of the mediastinum to suggest dissection. No evidence of acute injuries.  Pertinent labs & imaging results that were available during my care of the patient were reviewed by me and considered in my medical decision making:    Final Clinical Impression(s) / ED Diagnoses Final diagnoses:  Other secondary hypertension  Bad headache   The patient appears reasonably screened and/or stabilized for discharge and I doubt any other medical condition or other [facility] requiring further screening, evaluation, or  treatment in the ED at this time prior to discharge. Safe for discharge with strict return precautions.  Disposition: Discharge  Condition: Good  I have discussed the results, Dx and Tx plan with the patient/family  who expressed understanding and agree(s) with the plan. Discharge instructions discussed at length. The patient/family was given strict return precautions who verbalized understanding of the instructions. No further questions at time of discharge.    ED Discharge Orders     None        Follow Up: Barbette Merino, NP [address] [place] [address] [phone]  Call      This chart was dictated using voice recognition software.  Despite best efforts to proofread,  errors can occur which can change the documentation meaning.    Nira Conn, MD 12/02/20 [phone]

## 2020-12-02 NOTE — Discharge Instructions (Signed)
For pain control you may take at 1000 mg of Tylenol every 8 hours as needed for your headaches.

## 2020-12-02 NOTE — ED Notes (Addendum)
Pt refusing COVID swab at this time stating "I don't have that"

## 2020-12-02 NOTE — ED Notes (Signed)
Pt refusing to change into gown - pt put on monitor - pt reports no pain to this RN but tenderness on the left side "where I had my light stroke"

## 2020-12-19 ENCOUNTER — Emergency Department (HOSPITAL_COMMUNITY)
Admission: EM | Admit: 2020-12-19 | Discharge: 2020-12-19 | Disposition: A | Discharge status: Home / Self Care | Payer: Medicare Other | Attending: Emergency Medicine | Admitting: Emergency Medicine

## 2020-12-19 ENCOUNTER — Emergency Department (HOSPITAL_COMMUNITY): Payer: Medicare Other

## 2020-12-19 ENCOUNTER — Other Ambulatory Visit: Payer: Self-pay

## 2020-12-19 DIAGNOSIS — E119 Type 2 diabetes mellitus without complications: Secondary | ICD-10-CM | POA: Diagnosis not present

## 2020-12-19 DIAGNOSIS — Z7902 Long term (current) use of antithrombotics/antiplatelets: Secondary | ICD-10-CM | POA: Insufficient documentation

## 2020-12-19 DIAGNOSIS — Z79899 Other long term (current) drug therapy: Secondary | ICD-10-CM | POA: Diagnosis not present

## 2020-12-19 DIAGNOSIS — Z8616 Personal history of COVID-19: Secondary | ICD-10-CM | POA: Insufficient documentation

## 2020-12-19 DIAGNOSIS — Z7984 Long term (current) use of oral hypoglycemic drugs: Secondary | ICD-10-CM | POA: Insufficient documentation

## 2020-12-19 DIAGNOSIS — I10 Essential (primary) hypertension: Secondary | ICD-10-CM | POA: Diagnosis not present

## 2020-12-19 DIAGNOSIS — Z7982 Long term (current) use of aspirin: Secondary | ICD-10-CM | POA: Insufficient documentation

## 2020-12-19 DIAGNOSIS — R519 Headache, unspecified: Secondary | ICD-10-CM | POA: Diagnosis present

## 2020-12-19 LAB — BASIC METABOLIC PANEL
Anion gap: 10 (ref 5–15)
BUN: 14 mg/dL (ref 8–23)
CO2: 26 mmol/L (ref 22–32)
Calcium: 8.8 mg/dL — ABNORMAL LOW (ref 8.9–10.3)
Chloride: 101 mmol/L (ref 98–111)
Creatinine, Ser: 0.9 mg/dL (ref 0.44–1.00)
GFR, Estimated: >60 mL/min (ref >60)
Glucose, Bld: 102 mg/dL — ABNORMAL HIGH (ref 70–99)
Potassium: 3.8 mmol/L (ref 3.5–5.1)
Sodium: 137 mmol/L (ref 135–145)

## 2020-12-19 LAB — URINALYSIS, ROUTINE W REFLEX MICROSCOPIC
Bilirubin Urine: NEGATIVE
Glucose, UA: NEGATIVE mg/dL
Hgb urine dipstick: NEGATIVE
Ketones, ur: 5 mg/dL — AB
Nitrite: NEGATIVE
Protein, ur: NEGATIVE mg/dL
Specific Gravity, Urine: 1.019 (ref 1.005–1.030)
pH: 5 (ref 5.0–8.0)

## 2020-12-19 LAB — CBC WITH DIFFERENTIAL/PLATELET
Abs Immature Granulocytes: 0.01 10*3/uL (ref 0.00–0.07)
Basophils Absolute: 0 10*3/uL (ref 0.0–0.1)
Basophils Relative: 1 %
Eosinophils Absolute: 0.1 10*3/uL (ref 0.0–0.5)
Eosinophils Relative: 1 %
HCT: 36.1 % (ref 36.0–46.0)
Hemoglobin: 11.2 g/dL — ABNORMAL LOW (ref 12.0–15.0)
Immature Granulocytes: 0 %
Lymphocytes Relative: 20 %
Lymphs Abs: 1.2 10*3/uL (ref 0.7–4.0)
MCH: 28.4 pg (ref 26.0–34.0)
MCHC: 31 g/dL (ref 30.0–36.0)
MCV: 91.6 fL (ref 80.0–100.0)
Monocytes Absolute: 0.5 10*3/uL (ref 0.1–1.0)
Monocytes Relative: 9 %
Neutro Abs: 3.9 10*3/uL (ref 1.7–7.7)
Neutrophils Relative %: 69 %
Platelets: 244 10*3/uL (ref 150–400)
RBC: 3.94 MIL/uL (ref 3.87–5.11)
RDW: 12.8 % (ref 11.5–15.5)
WBC: 5.7 10*3/uL (ref 4.0–10.5)
nRBC: 0 % (ref 0.0–0.2)

## 2020-12-19 LAB — TROPONIN I (HIGH SENSITIVITY): Troponin I (High Sensitivity): 5 ng/L (ref <18)

## 2020-12-19 MED ORDER — DIPHENHYDRAMINE HCL 50 MG/ML IJ SOLN
12.5000 mg | Freq: Once | INTRAMUSCULAR | Status: AC
  Administered 2020-12-19: 10:00:00 12.5 mg via INTRAVENOUS
  Filled 2020-12-19: qty 1

## 2020-12-19 MED ORDER — METOCLOPRAMIDE HCL 5 MG/ML IJ SOLN
10.0000 mg | Freq: Once | INTRAMUSCULAR | Status: AC
  Administered 2020-12-19: 10:00:00 10 mg via INTRAVENOUS
  Filled 2020-12-19: qty 2

## 2020-12-19 MED ORDER — LOSARTAN POTASSIUM 50 MG PO TABS
50.0000 mg | ORAL_TABLET | Freq: Once | ORAL | Status: AC
  Administered 2020-12-19: 13:00:00 50 mg via ORAL
  Filled 2020-12-19: qty 1

## 2020-12-19 MED ORDER — BUTALBITAL-APAP-CAFFEINE 50-325-40 MG PO TABS
1.0000 | ORAL_TABLET | Freq: Once | ORAL | Status: AC
  Administered 2020-12-19: 10:00:00 1 via ORAL
  Filled 2020-12-19: qty 1

## 2020-12-19 NOTE — ED Triage Notes (Signed)
Pt c/o headache since yesterday. Pt also reports an elevated blood pressure.

## 2020-12-19 NOTE — ED Provider Notes (Signed)
[facility] EMERGENCY DEPARTMENT Provider Note   CSN: [id number] Arrival date & time: 12/19/20  0103     History Chief Complaint  Patient presents with   Headache    [name] is a 75 y.o. female.  With past medical history of diabetes, NSTEMI, CVA in 2014, hypertension who presents emergency department for headache and high blood pressure.  Patient states that for 2 days she has had ongoing severe, constant, global headache that she states is related to her elevated blood pressure.  She has not tried anything for the headache.  Nothing makes it worse.  She does endorse visual changes, blurriness, however she states these have been present for a month.  She denies fever, lightheadedness, dizziness, syncope, nausea or vomiting.  He denies chest pain or shortness of breath, hematuria associated with her high blood pressure.  She states she has been taking her medications as prescribed.   Headache Associated symptoms: no dizziness, no fever, no nausea and no vomiting       Past Medical History:  Diagnosis Date   Diabetes mellitus without complication ([employer])    GERD 09/04/2006   Qualifier: Diagnosis of  By: Suzie Portela     History of echocardiogram    a. 2D ECHO: 11/06/2013 EF 65%; no WMA. Mild TR. 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   Numbness 06/20/2012   Diabetes mellitus ([place]) 06/20/2012   CVA (cerebral infarction) 06/20/2012   Hypokalemia 06/20/2012   ARTHRALGIA 01/28/2009   CHEST PAIN UNSPECIFIED 11/26/2008    DEPRESSION 09/04/2006   Essential hypertension 09/04/2006   GERD 09/04/2006   MULTIPLE SITES, ARTHRITIS-ACUTE/CHRONIC 09/04/2006   LOW BACK PAIN, CHRONIC 09/04/2006    Past Surgical History:  Procedure Laterality Date   ABDOMINAL HYSTERECTOMY     ANTERIOR CERVICAL DECOMPRESSION/DISCECTOMY FUSION 4 LEVELS N/A 11/11/2019   Procedure: Cervical three-four Cervical four-five Cervical five-six Cervical six-seven  Anterior cervical decompression/discectomy/fusion;  Surgeon: Erline Levine, MD;  Location: [facility];  Service: Neurosurgery;  Laterality: N/A;   LEFT HEART CATHETERIZATION WITH CORONARY ANGIOGRAM N/A 11/05/2013   Procedure: LEFT HEART CATHETERIZATION WITH CORONARY ANGIOGRAM;  Surgeon: Troy Sine, MD;  Location: [facility] CATH LAB;  Service: Cardiovascular;  Laterality: N/A;     OB History   No obstetric history on file.     No family history on file.  Social History   Tobacco Use   Smoking status: Never   Smokeless tobacco: Never  Vaping Use   Vaping Use: Never used  Substance Use Topics   Alcohol use: No   Drug use: No    Home Medications Prior to Admission medications   Medication Sig Start Date End Date Taking? Authorizing Provider  albuterol (VENTOLIN HFA) 108 (90 Base) MCG/ACT inhaler Inhale 1-2 puffs into the lungs every 6 (six) hours as needed for wheezing or shortness of breath. 04/22/20   Vevelyn Francois, NP  aspirin EC 81 MG tablet Take 81 mg by mouth daily. Swallow whole.    [provider]  chlorthalidone (HYGROTON) 25 MG tablet  Take 1 tablet (25 mg total) by mouth daily. 10/05/20 10/05/21  Vevelyn Francois, NP  clopidogrel (PLAVIX) 75 MG tablet Take 1 tablet (75 mg total) by mouth daily. 04/22/20   Vevelyn Francois, NP  diclofenac Sodium (VOLTAREN) 1 % GEL Apply 2 g topically 4 (four) times daily as needed. 07/27/20   Quintella Reichert, MD  diphenhydramine-acetaminophen (TYLENOL PM) 25-500 MG TABS tablet Take 1 tablet by mouth at bedtime.    [provider]   gabapentin (NEURONTIN) 300 MG capsule Take 1 capsule (300 mg total) by mouth 3 (three) times daily. 10/05/20   Vevelyn Francois, NP  gatifloxacin (ZYMAXID) 0.5 % SOLN Place 1 drop into the right eye 4 (four) times daily. 06/28/20   [provider]  glipiZIDE (GLUCOTROL) 5 MG tablet Take 1 tablet (5 mg total) by mouth 2 (two) times daily before a meal. 04/22/20 04/22/21  Vevelyn Francois, NP  hydrALAZINE (APRESOLINE) 50 MG tablet Take 1 tablet (50 mg total) by mouth 2 (two) times daily. 04/22/20   Vevelyn Francois, NP  ketorolac (ACULAR) 0.5 % ophthalmic solution Place 1 drop into the right eye 4 (four) times daily. 06/28/20   [provider]  lidocaine (LIDODERM) 5 % Place 1 patch onto the skin daily. Remove & Discard patch within 12 hours or as directed by MD    [provider]  losartan (COZAAR) 50 MG tablet Take 1 tablet (50 mg total) by mouth daily. 10/05/20 10/05/21  Vevelyn Francois, NP  metFORMIN (GLUCOPHAGE) 500 MG tablet Take 2 tablets (1,000 mg total) by mouth 2 (two) times daily with a meal. 10/05/20 10/05/21  Vevelyn Francois, NP  mirabegron ER (MYRBETRIQ) 25 MG TB24 tablet Take 25 mg by mouth daily.    [provider]  [facility] VERIO test strip Use 1-4 times daily as directed/needed   DX E11.9 Patient not taking: No sig reported 04/25/20   Vevelyn Francois, NP  potassium chloride (KLOR-CON) 10 MEQ tablet Take 10 mEq by mouth daily.    [provider]  prednisoLONE acetate (PRED FORTE) 1 % ophthalmic suspension Place 1 drop into the right eye 4 (four) times daily. 06/28/20   [provider]  rosuvastatin (CRESTOR) 10 MG tablet Take 1 tablet (10 mg total) by mouth at bedtime. 10/05/20 10/05/21  Vevelyn Francois, NP    Allergies    Codeine, Ibuprofen, and Imdur [isosorbide nitrate]  Review of Systems   Review of Systems  Constitutional:  Negative for fever.  Eyes:  Positive for visual disturbance.  Respiratory:  Negative for shortness of breath.    Cardiovascular:  Negative for chest pain.  Gastrointestinal:  Negative for nausea and vomiting.  Genitourinary:  Negative for hematuria.  Neurological:  Positive for headaches. Negative for dizziness, syncope and light-headedness.  All other systems reviewed and are negative.  Physical Exam Updated Vital Signs BP (!) 150/59 (BP Location: Right Arm)   Pulse 70   Temp (!) 97.3 F (36.3 C)   Resp 16   Ht 5\' 3"  (1.6 m)   Wt 70.3 kg   SpO2 99%   BMI 27.45 kg/m   Physical Exam Vitals and nursing note reviewed.  Constitutional:      General: She is not in acute distress.    Appearance: Normal appearance. She is well-developed. She is not ill-appearing or toxic-appearing.  HENT:     Head: Normocephalic and atraumatic.     Mouth/Throat:     Mouth: Mucous membranes  are moist.     Pharynx: Oropharynx is clear.     Comments: Missing upper central incisors. Eyes:     General: No visual field deficit or scleral icterus.    Extraocular Movements: Extraocular movements intact.     Right eye: Normal extraocular motion and no nystagmus.     Left eye: Normal extraocular motion and no nystagmus.     Pupils: Pupils are equal, round, and reactive to light.  Cardiovascular:     Rate and Rhythm: Normal rate and regular rhythm.     Heart sounds: Normal heart sounds. No murmur heard. Pulmonary:     Effort: Pulmonary effort is normal. No respiratory distress.     Breath sounds: Normal breath sounds.  Abdominal:     General: Bowel sounds are normal. There is no distension.     Palpations: Abdomen is soft.  Musculoskeletal:        General: Normal range of motion.     Cervical back: Normal range of motion and neck supple. No rigidity or tenderness.  Skin:    General: Skin is warm and dry.     Capillary Refill: Capillary refill takes less than 2 seconds.  Neurological:     General: No focal deficit present.     Mental Status: She is alert and oriented to person, place, and time. Mental status  is at baseline.     GCS: GCS eye subscore is 4. GCS verbal subscore is 5. GCS motor subscore is 6.     Cranial Nerves: No cranial nerve deficit, dysarthria or facial asymmetry.     Motor: No weakness.  Psychiatric:        Mood and Affect: Mood normal.        Speech: Speech normal.        Behavior: Behavior normal.    ED Results / Procedures / Treatments   Labs (all labs ordered are listed, but only abnormal results are displayed) Labs Reviewed  URINALYSIS, ROUTINE W REFLEX MICROSCOPIC - Abnormal; Notable for the following components:      Result Value   APPearance HAZY (*)    Ketones, ur 5 (*)    Leukocytes,Ua SMALL (*)    Bacteria, UA RARE (*)    All other components within normal limits  BASIC METABOLIC PANEL - Abnormal; Notable for the following components:   Glucose, Bld 102 (*)    Calcium 8.8 (*)    All other components within normal limits  CBC WITH DIFFERENTIAL/PLATELET - Abnormal; Notable for the following components:   Hemoglobin 11.2 (*)    All other components within normal limits  TROPONIN I (HIGH SENSITIVITY)  TROPONIN I (HIGH SENSITIVITY)    EKG EKG Interpretation  Date/Time:  Monday December 19 2020 09:22:36 EST Ventricular Rate:  70 PR Interval:  128 QRS Duration: 70 QT Interval:  415 QTC Calculation: 448 R Axis:   50 Text Interpretation: Sinus rhythm No significant change since last tracing Confirmed by Linwood Dibbles [phone]) on 12/19/2020 9:28:04 AM  Radiology CT Head Wo Contrast  Result Date: 12/19/2020 CLINICAL DATA:  Headache, classic migraine since yesterday, elevated blood pressure EXAM: CT HEAD WITHOUT CONTRAST TECHNIQUE: Contiguous axial images were obtained from the base of the skull through the vertex without intravenous contrast. COMPARISON:  12/01/2020 FINDINGS: Brain: Normal ventricular morphology. No midline shift or mass effect. Normal appearance of brain parenchyma. No intracranial hemorrhage, mass lesion, evidence of acute infarction, or  extra-axial fluid collection. Vascular: No hyperdense vessels Skull: Intact Sinuses/Orbits: Clear Other:  N/A IMPRESSION: Normal exam. Electronically Signed   By: Lavonia Dana M.D.   On: 12/19/2020 10:07    Procedures Procedures   Medications Ordered in ED Medications  losartan (COZAAR) tablet 50 mg (has no administration in time range)  butalbital-acetaminophen-caffeine (FIORICET) 50-325-40 MG per tablet 1 tablet (1 tablet Oral Given 12/19/20 1018)  metoCLOPramide (REGLAN) injection 10 mg (10 mg Intravenous Given 12/19/20 1018)  diphenhydrAMINE (BENADRYL) injection 12.5 mg (12.5 mg Intravenous Given 12/19/20 1020)    ED Course  I have reviewed the triage vital signs and the nursing notes.  Pertinent labs & imaging results that were available during my care of the patient were reviewed by me and considered in my medical decision making (see chart for details).    MDM Rules/Calculators/A&P 75 year old female who presents to the emergency department with headache and elevated blood pressure.   Her BP is not significantly elevated here. Systolic 0000000.  Neurologically intact without focal neurological deficits.  CT head without abnormalities  No neck rigidity, fever or AMS concerning for infection or meningismus or encephalopathy  EKG with ischemia or infarction, troponin x1 negative, she did not complain of chest pain at presentation or now.  Doubt ACS as sequelae of HTN Cr 0.90, no evidence of endorgan damage UA without hemoglobin or protein Given headache cocktail with relief of symptoms. Hemoglobin 11.2, increased from previous. Given home dose of losartan 50 mg oral prior to discharge  She is instructed follow-up primary care provider Dionisio David, NP.  I have instructed her to continue to take her blood pressure medication as prescribed.  She is instructed to return to the emergency department should she have ongoing headache associated with fever, nausea or vomiting or  altered mental status.  She verbalizes understanding.  Safe for discharge  Final Clinical Impression(s) / ED Diagnoses Final diagnoses:  Primary hypertension  Acute nonintractable headache, unspecified headache type    Rx / DC Orders ED Discharge Orders     None        Mickie Hillier, PA-C 12/19/20 1241    Dorie Rank, MD 12/21/20 [phone]

## 2020-12-19 NOTE — Discharge Instructions (Addendum)
You were seen in the emergency department today for a headache and high blood pressure.  While you were here we did a thorough work-up which was all reassuring.  Your headache is likely from your blood pressure being elevated.  We gave you a dose of your home medication while you were here.  Please continue to take your blood pressure medication as prescribed.  Please follow-up with your primary care provider in 1 week for continued management of your blood pressure medication.  Return to the emergency department if he had headache associated with fever, nausea or vomiting or altered mental status.

## 2020-12-19 NOTE — ED Provider Notes (Signed)
MSE was initiated and I personally evaluated the patient and placed orders (if any) at  3:24 AM on December 19, 2020.  Patient well known to the emergency department here for headache and elevated blood pressure. She feels that she is under stress and this is causing her symptoms.   Today's Vitals   12/19/20 0248 12/19/20 0249 12/19/20 0249  BP:   (!) 160/60  Pulse:   70  Resp:   16  Temp:   (!) 97.3 F (36.3 C)  SpO2:   97%  Weight:  70.3 kg   Height:  5\' 3"  (1.6 m)   PainSc: 10-Worst pain ever     Body mass index is 27.45 kg/m.  Awake, alert No neuro deficits  The patient appears stable so that the remainder of the MSE may be completed by another provider.   , PA-C 12/19/20 0326    12/21/20, MD 12/19/20 [phone]

## 2020-12-19 NOTE — ED Notes (Signed)
Pt discharged via wheel chair in stable condition.

## 2020-12-21 ENCOUNTER — Other Ambulatory Visit: Payer: Self-pay

## 2020-12-21 ENCOUNTER — Emergency Department (HOSPITAL_COMMUNITY)
Admission: EM | Admit: 2020-12-21 | Discharge: 2020-12-22 | Disposition: A | Discharge status: Home / Self Care | Payer: Medicare Other | Attending: Emergency Medicine | Admitting: Emergency Medicine

## 2020-12-21 ENCOUNTER — Encounter (HOSPITAL_COMMUNITY): Payer: Self-pay | Admitting: Emergency Medicine

## 2020-12-21 ENCOUNTER — Emergency Department (HOSPITAL_COMMUNITY): Payer: Medicare Other

## 2020-12-21 DIAGNOSIS — R030 Elevated blood-pressure reading, without diagnosis of hypertension: Secondary | ICD-10-CM | POA: Diagnosis present

## 2020-12-21 DIAGNOSIS — E119 Type 2 diabetes mellitus without complications: Secondary | ICD-10-CM | POA: Diagnosis not present

## 2020-12-21 DIAGNOSIS — I10 Essential (primary) hypertension: Secondary | ICD-10-CM | POA: Insufficient documentation

## 2020-12-21 DIAGNOSIS — Z79899 Other long term (current) drug therapy: Secondary | ICD-10-CM | POA: Insufficient documentation

## 2020-12-21 DIAGNOSIS — Z7982 Long term (current) use of aspirin: Secondary | ICD-10-CM | POA: Diagnosis not present

## 2020-12-21 DIAGNOSIS — Z7902 Long term (current) use of antithrombotics/antiplatelets: Secondary | ICD-10-CM | POA: Diagnosis not present

## 2020-12-21 DIAGNOSIS — Z8616 Personal history of COVID-19: Secondary | ICD-10-CM | POA: Diagnosis not present

## 2020-12-21 DIAGNOSIS — Z7984 Long term (current) use of oral hypoglycemic drugs: Secondary | ICD-10-CM | POA: Diagnosis not present

## 2020-12-21 LAB — CBC WITH DIFFERENTIAL/PLATELET
Abs Immature Granulocytes: 0.02 10*3/uL (ref 0.00–0.07)
Basophils Absolute: 0 10*3/uL (ref 0.0–0.1)
Basophils Relative: 1 %
Eosinophils Absolute: 0 10*3/uL (ref 0.0–0.5)
Eosinophils Relative: 1 %
HCT: 36.4 % (ref 36.0–46.0)
Hemoglobin: 11.6 g/dL — ABNORMAL LOW (ref 12.0–15.0)
Immature Granulocytes: 0 %
Lymphocytes Relative: 20 %
Lymphs Abs: 1.6 10*3/uL (ref 0.7–4.0)
MCH: 28.9 pg (ref 26.0–34.0)
MCHC: 31.9 g/dL (ref 30.0–36.0)
MCV: 90.5 fL (ref 80.0–100.0)
Monocytes Absolute: 0.6 10*3/uL (ref 0.1–1.0)
Monocytes Relative: 7 %
Neutro Abs: 5.5 10*3/uL (ref 1.7–7.7)
Neutrophils Relative %: 71 %
Platelets: 275 10*3/uL (ref 150–400)
RBC: 4.02 MIL/uL (ref 3.87–5.11)
RDW: 12.7 % (ref 11.5–15.5)
WBC: 7.8 10*3/uL (ref 4.0–10.5)
nRBC: 0 % (ref 0.0–0.2)

## 2020-12-21 LAB — BASIC METABOLIC PANEL
Anion gap: 11 (ref 5–15)
BUN: 13 mg/dL (ref 8–23)
CO2: 24 mmol/L (ref 22–32)
Calcium: 9 mg/dL (ref 8.9–10.3)
Chloride: 102 mmol/L (ref 98–111)
Creatinine, Ser: 0.96 mg/dL (ref 0.44–1.00)
GFR, Estimated: >60 mL/min (ref >60)
Glucose, Bld: 110 mg/dL — ABNORMAL HIGH (ref 70–99)
Potassium: 3.5 mmol/L (ref 3.5–5.1)
Sodium: 137 mmol/L (ref 135–145)

## 2020-12-21 LAB — TROPONIN I (HIGH SENSITIVITY): Troponin I (High Sensitivity): 6 ng/L (ref <18)

## 2020-12-21 NOTE — ED Provider Notes (Signed)
Emergency Medicine Provider Triage Evaluation Note  Sharol Croghan , a 75 y.o. female  was evaluated in triage.  Pt complains of diffuse body pain, htn, chest pain and ha. Seen yesterday for same.  Review of Systems  Positive: Chest pain, ha, diffuse body pain Negative: fever  Physical Exam  There were no vitals taken for this visit. Gen:   Awake, no distress   Resp:  Normal effort  MSK:   Moves extremities without difficulty  Other:  Heart rrr, cn II-XII intact, 5/5 strength to the bue/ble  Medical Decision Making  Medically screening exam initiated at 7:17 PM.  Appropriate orders placed.  Kaliopi Blyden was informed that the remainder of the evaluation will be completed by another provider, this initial triage assessment does not replace that evaluation, and the importance of remaining in the ED until their evaluation is complete.     Rayne Du 12/21/20 1917    Lorre Nick, MD 12/22/20 [phone]

## 2020-12-21 NOTE — Care Management (Signed)
ED RNCM received consult for medication assistance.  This patient is known to this RNCM.  Patient has had 32 ED visits in the past 6 months.  RNCM have sent several messages to [facility] and they have arranged several follow visits in which the patient has showed up for possibly one appointment back in Sept.  [facility] was also consulted and as noted in patient's record they made several attempts to contact but was unsuccessful.  Noted that patient was written for 3 month supply of meds in September refills would be needed in 2 weeks, patient will need to follow up with PCP to  get medication refill. RNCM will contact patient's pharmacy to see if medications were picked up tomorrow. ED RNCM will follow up tomorrow.

## 2020-12-21 NOTE — ED Triage Notes (Signed)
Patient presents to the ED by EMS with c/o HTN ongoing along with headache.  Pt wants to be compliant but has no way to get meds.   216/108 80  97% RA

## 2020-12-22 DIAGNOSIS — I10 Essential (primary) hypertension: Secondary | ICD-10-CM | POA: Diagnosis not present

## 2020-12-22 LAB — TROPONIN I (HIGH SENSITIVITY): Troponin I (High Sensitivity): 6 ng/L (ref <18)

## 2020-12-22 MED ORDER — HYDRALAZINE HCL 25 MG PO TABS
50.0000 mg | ORAL_TABLET | Freq: Once | ORAL | Status: AC
  Administered 2020-12-22: 50 mg via ORAL
  Filled 2020-12-22: qty 2

## 2020-12-22 NOTE — ED Provider Notes (Signed)
[facility] EMERGENCY DEPARTMENT Provider Note   CSN: [id number] Arrival date & time: 12/21/20  1906     History No chief complaint on file.   [name] is a 75 y.o. female.  HPI  75 year old female past medical history of DM, HTN presents the emergency department with concern for ongoing high blood pressure.  Patient right now is unsure if she has been compliant with her home medications.  She comes in today complaining of whole body pain.  She has had chronic intermittent chest pain and head pain.  Both of which are mild today, nonacute, unchanged.  No recent fever.  No other acute illness.  Past Medical History:  Diagnosis Date   Diabetes mellitus without complication (HCC)    GERD 09/04/2006   Qualifier: Diagnosis of  By: Duke Salvia     History of echocardiogram    a. 2D ECHO: 11/06/2013 EF 65%; no WMA. Mild TR. PA pk pressure 43 mm HG   Hypertension    Stroke 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   Numbness 06/20/2012   Diabetes mellitus (HCC) 06/20/2012   CVA (cerebral infarction) 06/20/2012   Hypokalemia 06/20/2012   ARTHRALGIA 01/28/2009   CHEST PAIN UNSPECIFIED 11/26/2008   DEPRESSION 09/04/2006   Essential hypertension 09/04/2006   GERD 09/04/2006   MULTIPLE SITES, ARTHRITIS-ACUTE/CHRONIC 09/04/2006   LOW BACK PAIN, CHRONIC 09/04/2006    Past Surgical History:  Procedure Laterality Date   ABDOMINAL HYSTERECTOMY     ANTERIOR CERVICAL DECOMPRESSION/DISCECTOMY FUSION 4 LEVELS N/A 11/11/2019   Procedure: Cervical three-four Cervical four-five Cervical  five-six Cervical six-seven  Anterior cervical decompression/discectomy/fusion;  Surgeon: Maeola Harman, MD;  Location: [facility] OR;  Service: Neurosurgery;  Laterality: N/A;   LEFT HEART CATHETERIZATION WITH CORONARY ANGIOGRAM N/A 11/05/2013   Procedure: LEFT HEART CATHETERIZATION WITH CORONARY ANGIOGRAM;  Surgeon: Lennette Bihari, MD;  Location: [facility] CATH LAB;  Service: Cardiovascular;  Laterality: N/A;     OB History   No obstetric history on file.     History reviewed. No pertinent family history.  Social History   Tobacco Use   Smoking status: Never   Smokeless tobacco: Never  Vaping Use   Vaping Use: Never used  Substance Use Topics   Alcohol use: No   Drug use: No    Home Medications Prior to Admission medications   Medication Sig Start Date End Date Taking? Authorizing Provider  albuterol (VENTOLIN HFA) 108 (90 Base) MCG/ACT inhaler Inhale 1-2 puffs into the lungs every 6 (six) hours as needed for wheezing or shortness of breath. 04/22/20   Barbette Merino, NP  aspirin EC 81 MG tablet Take 81 mg by mouth daily. Swallow whole.    [provider]  chlorthalidone (HYGROTON) 25 MG tablet Take 1 tablet (25 mg total) by mouth daily. 10/05/20 10/05/21  Barbette Merino, NP  clopidogrel (PLAVIX) 75 MG tablet Take 1 tablet (75 mg total) by mouth daily. 04/22/20   Barbette Merino, NP  diclofenac Sodium (VOLTAREN) 1 % GEL Apply 2 g topically 4 (four) times daily as needed. 07/27/20   Tilden Fossa, MD  diphenhydramine-acetaminophen (TYLENOL PM) 25-500 MG TABS tablet Take 1 tablet by mouth at bedtime.    [provider]  gabapentin (NEURONTIN) 300 MG capsule Take 1 capsule (300 mg total) by mouth 3 (three) times daily. 10/05/20   Vevelyn Francois, NP  gatifloxacin (ZYMAXID) 0.5 % SOLN Place 1 drop into the right eye 4 (four) times daily. 06/28/20   [provider]  glipiZIDE (GLUCOTROL) 5 MG tablet Take 1 tablet (5 mg total) by mouth 2 (two) times daily before a meal. 04/22/20  04/22/21  Vevelyn Francois, NP  hydrALAZINE (APRESOLINE) 50 MG tablet Take 1 tablet (50 mg total) by mouth 2 (two) times daily. 04/22/20   Vevelyn Francois, NP  ketorolac (ACULAR) 0.5 % ophthalmic solution Place 1 drop into the right eye 4 (four) times daily. 06/28/20   [provider]  lidocaine (LIDODERM) 5 % Place 1 patch onto the skin daily. Remove & Discard patch within 12 hours or as directed by MD    [provider]  losartan (COZAAR) 50 MG tablet Take 1 tablet (50 mg total) by mouth daily. 10/05/20 10/05/21  Vevelyn Francois, NP  metFORMIN (GLUCOPHAGE) 500 MG tablet Take 2 tablets (1,000 mg total) by mouth 2 (two) times daily with a meal. 10/05/20 10/05/21  Vevelyn Francois, NP  mirabegron ER (MYRBETRIQ) 25 MG TB24 tablet Take 25 mg by mouth daily.    [provider]  [facility] VERIO test strip Use 1-4 times daily as directed/needed   DX E11.9 Patient not taking: No sig reported 04/25/20   Vevelyn Francois, NP  potassium chloride (KLOR-CON) 10 MEQ tablet Take 10 mEq by mouth daily.    [provider]  prednisoLONE acetate (PRED FORTE) 1 % ophthalmic suspension Place 1 drop into the right eye 4 (four) times daily. 06/28/20   [provider]  rosuvastatin (CRESTOR) 10 MG tablet Take 1 tablet (10 mg total) by mouth at bedtime. 10/05/20 10/05/21  Vevelyn Francois, NP    Allergies    Codeine, Ibuprofen, and Imdur [isosorbide nitrate]  Review of Systems   Review of Systems  Constitutional:  Negative for chills and fever.  HENT:  Negative for congestion.   Eyes:  Negative for visual disturbance.  Respiratory:  Negative for chest tightness and shortness of breath.   Cardiovascular:  Positive for chest pain. Negative for palpitations and leg swelling.       + HTN  Gastrointestinal:  Negative for abdominal pain, diarrhea and vomiting.  Genitourinary:  Negative for dysuria.  Musculoskeletal:  Negative for back pain.  Skin:  Negative for rash.  Neurological:  Positive  for headaches. Negative for weakness, light-headedness and numbness.   Physical Exam Updated Vital Signs BP (!) 184/56 (BP Location: Right Arm)   Pulse 87   Temp 98.5 F (36.9 C) (Oral)   Resp 19   SpO2 99%   Physical Exam Vitals and nursing note reviewed.  Constitutional:      General: She is not in acute distress.    Appearance: Normal appearance. She is not diaphoretic.  HENT:     Head: Normocephalic.     Mouth/Throat:     Mouth: Mucous membranes are moist.  Eyes:     Pupils: Pupils are equal, round, and reactive to light.  Cardiovascular:     Rate and Rhythm: Normal rate.  Pulmonary:     Effort: Pulmonary effort is normal. No respiratory distress.  Abdominal:     Palpations: Abdomen is soft.  Tenderness: There is no abdominal tenderness.  Musculoskeletal:     Cervical back: No rigidity or tenderness.  Skin:    General: Skin is warm.  Neurological:     Mental Status: She is alert and oriented to person, place, and time. Mental status is at baseline.  Psychiatric:        Mood and Affect: Mood normal.    ED Results / Procedures / Treatments   Labs (all labs ordered are listed, but only abnormal results are displayed) Labs Reviewed  CBC WITH DIFFERENTIAL/PLATELET - Abnormal; Notable for the following components:      Result Value   Hemoglobin 11.6 (*)    All other components within normal limits  BASIC METABOLIC PANEL - Abnormal; Notable for the following components:   Glucose, Bld 110 (*)    All other components within normal limits  TROPONIN I (HIGH SENSITIVITY)  TROPONIN I (HIGH SENSITIVITY)    EKG EKG Interpretation  Date/Time:  Wednesday December 21 2020 20:05:23 EST Ventricular Rate:  82 PR Interval:  126 QRS Duration: 62 QT Interval:  384 QTC Calculation: 448 R Axis:   82 Text Interpretation: Sinus rhythm with Premature atrial complexes Nonspecific ST abnormality Abnormal ECG Similar to previous Confirmed by Lavenia Atlas [phone]) on 12/22/2020  12:07:01 PM  Radiology DG Chest 2 View  Result Date: 12/21/2020 CLINICAL DATA:  Headache, hypertension EXAM: CHEST - 2 VIEW COMPARISON:  11/24/2020 FINDINGS: The heart size and mediastinal contours are within normal limits. Both lungs are clear. The visualized skeletal structures are unremarkable. IMPRESSION: No active cardiopulmonary disease. Electronically Signed   By: Franchot Gallo M.D.   On: 12/21/2020 20:27    Procedures Procedures   Medications Ordered in ED Medications  hydrALAZINE (APRESOLINE) tablet 50 mg (50 mg Oral Given 12/22/20 1214)    ED Course  I have reviewed the triage vital signs and the nursing notes.  Pertinent labs & imaging results that were available during my care of the patient were reviewed by me and considered in my medical decision making (see chart for details).    MDM Rules/Calculators/A&P                           75 year old female presents emergency department ongoing HTN.  She has been seen in our emergency department multiple times for similar complaints.  Unclear if she has been compliant with her medications today.  She has whole body pain.  Chronic chest pain/head pain that is unchanged.  Afebrile here.  Neuro intact, no acute distress.  EKG is unchanged for her.  Blood work is reassuring, no AKI.  Troponins are negative and flat.  Patient was given a home dose of her medication and blood pressure down trended to systolic [id number].  Looking back at her previous visits it looks like her SBP mainly stays around 1 70-1 80.  No signs of hypertensive urgency/emergency.  She is otherwise well-appearing, tolerating p.o., ambulatory.  Encourage patient to follow-up with her primary doctor for repeat blood pressure measuring and medication adjustment.  Patient at this time appears safe and stable for discharge and will be treated as an outpatient.  Discharge plan and strict return to ED precautions discussed, patient verbalizes understanding and  agreement.  Final Clinical Impression(s) / ED Diagnoses Final diagnoses:  Hypertension, unspecified type    Rx / DC Orders ED Discharge Orders     None        Rihana Kiddy, Alvin Critchley, DO  12/22/20 1404  

## 2020-12-22 NOTE — Discharge Instructions (Signed)
You have been seen and discharged from the emergency department.  It is very important that you are compliant with your medications.  Follow-up with your primary provider for reevaluation of blood pressure, medication management and further care. Take home medications as prescribed. If you have any worsening symptoms or further concerns for your health please return to an emergency department for further evaluation.

## 2020-12-22 NOTE — Discharge Planning (Signed)
RNCM met with pt at bedside regarding transportation for Rx pick-up.  Pt had insulated lunch bag FULL of Rx (some pill bottles full).  Pt states the process of riding bus "into town to go down the street" was annoying.  RNCM suggested pt change pharmacy to one that delivers.  Pt agreeable to suggestion.  RNCM will research best option for her.

## 2020-12-22 NOTE — Care Management (Signed)
[facility] [address] She did have some scripts filled, but losarten, metformin, gabepentin have been not filled or on hold since Sept/Oct. She did not get them refilled. Is refilling lidocaine patch [employer], , last fill July.. A lidocaine patch is available currently.

## 2020-12-23 ENCOUNTER — Telehealth: Payer: Self-pay | Admitting: Surgery

## 2020-12-23 NOTE — Telephone Encounter (Signed)
ED RNCM spoke with Freddi Starr (daughter)  [phone] concerning patient's frequent ED visits.  Daughter reports that she is aware, but verbalized that she is not able to do much because she is experiencing many life stressors at this time.  Discussed [facility] program, medication management daughter also believe patient would benefit for the services.  ED RNCM attempted to reach patient by phone no answer, NVM, will make a second attempt Sunday 12/4.

## 2020-12-25 ENCOUNTER — Other Ambulatory Visit: Payer: Self-pay

## 2020-12-25 ENCOUNTER — Emergency Department (HOSPITAL_COMMUNITY)
Admission: EM | Admit: 2020-12-25 | Discharge: 2020-12-25 | Disposition: A | Discharge status: Home / Self Care | Payer: Medicare Other | Attending: Emergency Medicine | Admitting: Emergency Medicine

## 2020-12-25 ENCOUNTER — Encounter (HOSPITAL_COMMUNITY): Payer: Self-pay | Admitting: *Deleted

## 2020-12-25 DIAGNOSIS — E876 Hypokalemia: Secondary | ICD-10-CM | POA: Insufficient documentation

## 2020-12-25 DIAGNOSIS — R519 Headache, unspecified: Secondary | ICD-10-CM | POA: Insufficient documentation

## 2020-12-25 DIAGNOSIS — Z7984 Long term (current) use of oral hypoglycemic drugs: Secondary | ICD-10-CM | POA: Insufficient documentation

## 2020-12-25 DIAGNOSIS — Z79899 Other long term (current) drug therapy: Secondary | ICD-10-CM | POA: Diagnosis not present

## 2020-12-25 DIAGNOSIS — Z7982 Long term (current) use of aspirin: Secondary | ICD-10-CM | POA: Insufficient documentation

## 2020-12-25 DIAGNOSIS — E119 Type 2 diabetes mellitus without complications: Secondary | ICD-10-CM | POA: Insufficient documentation

## 2020-12-25 DIAGNOSIS — Z8616 Personal history of COVID-19: Secondary | ICD-10-CM | POA: Diagnosis not present

## 2020-12-25 DIAGNOSIS — I10 Essential (primary) hypertension: Secondary | ICD-10-CM | POA: Diagnosis not present

## 2020-12-25 DIAGNOSIS — Z7951 Long term (current) use of inhaled steroids: Secondary | ICD-10-CM | POA: Insufficient documentation

## 2020-12-25 LAB — CBC WITH DIFFERENTIAL/PLATELET
Abs Immature Granulocytes: 0.02 10*3/uL (ref 0.00–0.07)
Basophils Absolute: 0 10*3/uL (ref 0.0–0.1)
Basophils Relative: 0 %
Eosinophils Absolute: 0.1 10*3/uL (ref 0.0–0.5)
Eosinophils Relative: 1 %
HCT: 38.1 % (ref 36.0–46.0)
Hemoglobin: 12 g/dL (ref 12.0–15.0)
Immature Granulocytes: 0 %
Lymphocytes Relative: 19 %
Lymphs Abs: 1.3 10*3/uL (ref 0.7–4.0)
MCH: 28.7 pg (ref 26.0–34.0)
MCHC: 31.5 g/dL (ref 30.0–36.0)
MCV: 91.1 fL (ref 80.0–100.0)
Monocytes Absolute: 0.5 10*3/uL (ref 0.1–1.0)
Monocytes Relative: 8 %
Neutro Abs: 5 10*3/uL (ref 1.7–7.7)
Neutrophils Relative %: 72 %
Platelets: 288 10*3/uL (ref 150–400)
RBC: 4.18 MIL/uL (ref 3.87–5.11)
RDW: 13 % (ref 11.5–15.5)
WBC: 7 10*3/uL (ref 4.0–10.5)
nRBC: 0 % (ref 0.0–0.2)

## 2020-12-25 LAB — BASIC METABOLIC PANEL
Anion gap: 10 (ref 5–15)
BUN: 9 mg/dL (ref 8–23)
CO2: 28 mmol/L (ref 22–32)
Calcium: 8.9 mg/dL (ref 8.9–10.3)
Chloride: 99 mmol/L (ref 98–111)
Creatinine, Ser: 1.08 mg/dL — ABNORMAL HIGH (ref 0.44–1.00)
GFR, Estimated: 54 mL/min — ABNORMAL LOW (ref >60)
Glucose, Bld: 81 mg/dL (ref 70–99)
Potassium: 3.1 mmol/L — ABNORMAL LOW (ref 3.5–5.1)
Sodium: 137 mmol/L (ref 135–145)

## 2020-12-25 NOTE — ED Triage Notes (Signed)
Pt arrived with EMS for headache and hypertension after taking glipizide   Fentanyl given enroute, 20g  IV L forearm   EMS VS 180/70, pulse 70 cbg 86

## 2020-12-25 NOTE — Discharge Instructions (Signed)
The headache could be due to your new medicine.  With time it should improve as the medicine gets out of your system.  Follow-up with your doctor as needed for further adjustment of your medications.

## 2020-12-25 NOTE — ED Provider Notes (Signed)
[facility] EMERGENCY DEPARTMENT Provider Note   CSN: [id number] Arrival date & time: 12/25/20  0606     History Chief Complaint  Patient presents with   Headache    [name] is a 75 y.o. female.   Headache Associated symptoms: no back pain, no congestion, no eye pain, no numbness, no seizures and no weakness   Patient presents with headache.  Diffuse over her whole head.  States it started quickly after taking glipizide for the first time today.  Similar to headache she has had before.  No numbness or weakness.  Some fentanyl given by EMS.  Continued headache.  No numbness or weakness.  No visual changes.  Has had similar headaches in the past.  This is her 33rd visit to the ER in the last 6 months.    Past Medical History:  Diagnosis Date   Diabetes mellitus without complication (HCC)    GERD 09/04/2006   Qualifier: Diagnosis of  By: Duke Salvia     History of echocardiogram    a. 2D ECHO: 11/06/2013 EF 65%; no WMA. Mild TR. 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   Numbness 06/20/2012   Diabetes mellitus (HCC) 06/20/2012   CVA (cerebral infarction) 06/20/2012   Hypokalemia 06/20/2012   ARTHRALGIA 01/28/2009   CHEST PAIN UNSPECIFIED 11/26/2008   DEPRESSION 09/04/2006   Essential hypertension 09/04/2006   GERD 09/04/2006   MULTIPLE SITES, ARTHRITIS-ACUTE/CHRONIC 09/04/2006   LOW BACK PAIN, CHRONIC 09/04/2006    Past Surgical History:  Procedure Laterality Date   ABDOMINAL HYSTERECTOMY     ANTERIOR CERVICAL  DECOMPRESSION/DISCECTOMY FUSION 4 LEVELS N/A 11/11/2019   Procedure: Cervical three-four Cervical four-five Cervical five-six Cervical six-seven  Anterior cervical decompression/discectomy/fusion;  Surgeon: Maeola Harman, MD;  Location: [facility] OR;  Service: Neurosurgery;  Laterality: N/A;   LEFT HEART CATHETERIZATION WITH CORONARY ANGIOGRAM N/A 11/05/2013   Procedure: LEFT HEART CATHETERIZATION WITH CORONARY ANGIOGRAM;  Surgeon: Lennette Bihari, MD;  Location: [facility] CATH LAB;  Service: Cardiovascular;  Laterality: N/A;     OB History   No obstetric history on file.     No family history on file.  Social History   Tobacco Use   Smoking status: Never   Smokeless tobacco: Never  Vaping Use   Vaping Use: Never used  Substance Use Topics   Alcohol use: No   Drug use: No    Home Medications Prior to Admission medications   Medication Sig Start Date End Date Taking? Authorizing Provider  albuterol (VENTOLIN HFA) 108 (90 Base) MCG/ACT inhaler Inhale 1-2 puffs into the lungs every 6 (six) hours as needed for wheezing or shortness of breath. 04/22/20   Barbette Merino, NP  aspirin EC 81 MG tablet Take 81 mg by mouth daily. Swallow whole.    [provider]  chlorthalidone (HYGROTON) 25 MG tablet Take 1 tablet (25 mg total) by mouth daily. 10/05/20 10/05/21  Barbette Merino, NP  clopidogrel (PLAVIX) 75 MG tablet Take 1 tablet (75 mg total) by mouth daily. 04/22/20  Vevelyn Francois, NP  diclofenac Sodium (VOLTAREN) 1 % GEL Apply 2 g topically 4 (four) times daily as needed. 07/27/20   Quintella Reichert, MD  diphenhydramine-acetaminophen (TYLENOL PM) 25-500 MG TABS tablet Take 1 tablet by mouth at bedtime.    [provider]  gabapentin (NEURONTIN) 300 MG capsule Take 1 capsule (300 mg total) by mouth 3 (three) times daily. 10/05/20   Vevelyn Francois, NP  gatifloxacin (ZYMAXID) 0.5 % SOLN Place 1 drop into the right eye 4 (four) times daily. 06/28/20   [provider]  glipiZIDE  (GLUCOTROL) 5 MG tablet Take 1 tablet (5 mg total) by mouth 2 (two) times daily before a meal. 04/22/20 04/22/21  Vevelyn Francois, NP  hydrALAZINE (APRESOLINE) 50 MG tablet Take 1 tablet (50 mg total) by mouth 2 (two) times daily. 04/22/20   Vevelyn Francois, NP  ketorolac (ACULAR) 0.5 % ophthalmic solution Place 1 drop into the right eye 4 (four) times daily. 06/28/20   [provider]  lidocaine (LIDODERM) 5 % Place 1 patch onto the skin daily. Remove & Discard patch within 12 hours or as directed by MD    [provider]  losartan (COZAAR) 50 MG tablet Take 1 tablet (50 mg total) by mouth daily. 10/05/20 10/05/21  Vevelyn Francois, NP  metFORMIN (GLUCOPHAGE) 500 MG tablet Take 2 tablets (1,000 mg total) by mouth 2 (two) times daily with a meal. 10/05/20 10/05/21  Vevelyn Francois, NP  mirabegron ER (MYRBETRIQ) 25 MG TB24 tablet Take 25 mg by mouth daily.    [provider]  [facility] VERIO test strip Use 1-4 times daily as directed/needed   DX E11.9 Patient not taking: No sig reported 04/25/20   Vevelyn Francois, NP  potassium chloride (KLOR-CON) 10 MEQ tablet Take 10 mEq by mouth daily.    [provider]  prednisoLONE acetate (PRED FORTE) 1 % ophthalmic suspension Place 1 drop into the right eye 4 (four) times daily. 06/28/20   [provider]  rosuvastatin (CRESTOR) 10 MG tablet Take 1 tablet (10 mg total) by mouth at bedtime. 10/05/20 10/05/21  Vevelyn Francois, NP    Allergies    Codeine, Ibuprofen, and Imdur [isosorbide nitrate]  Review of Systems   Review of Systems  Constitutional:  Negative for appetite change.  HENT:  Negative for congestion.   Eyes:  Negative for pain and visual disturbance.  Respiratory:  Negative for shortness of breath.   Cardiovascular:  Negative for leg swelling.  Musculoskeletal:  Negative for back pain.  Neurological:  Positive for headaches. Negative for seizures, weakness and numbness.   Physical Exam Updated Vital Signs BP (!)  146/59   Pulse 71   Temp 97.8 F (36.6 C)   Resp 17   SpO2 98%   Physical Exam Vitals and nursing note reviewed.  HENT:     Head: Atraumatic.  Eyes:     Pupils: Pupils are equal, round, and reactive to light.  Cardiovascular:     Rate and Rhythm: Regular rhythm.  Pulmonary:     Breath sounds: Normal breath sounds.  Abdominal:     Tenderness: There is no abdominal tenderness.  Musculoskeletal:     Cervical back: Neck supple.  Skin:    General: Skin is warm.  Neurological:     Mental Status: She is alert.  Psychiatric:        Mood and Affect: Mood normal.    ED Results / Procedures / Treatments  Labs (all labs ordered are listed, but only abnormal results are displayed) Labs Reviewed  BASIC METABOLIC PANEL - Abnormal; Notable for the following components:      Result Value   Potassium 3.1 (*)    Creatinine, Ser 1.08 (*)    GFR, Estimated 54 (*)    All other components within normal limits  CBC WITH DIFFERENTIAL/PLATELET    EKG None  Radiology No results found.  Procedures Procedures   Medications Ordered in ED Medications - No data to display  ED Course  I have reviewed the triage vital signs and the nursing notes.  Pertinent labs & imaging results that were available during my care of the patient were reviewed by me and considered in my medical decision making (see chart for details).    MDM Rules/Calculators/A&P                          Patient with headache.  History of same.  Similar headache to before but this time started right after taking glipizide.  Does not have his medicine before.  Sugar reassuring.  Mild hypokalemia can follow-up as an outpatient.  Vitals reassuring.  Discharge home with outpatient follow-up as needed.  Will discuss with prescriber about whether glipizide would still be needed or replaced but will not take for now. Final Clinical Impression(s) / ED Diagnoses Final diagnoses:  Acute nonintractable headache, unspecified  headache type    Rx / DC Orders ED Discharge Orders     None        Davonna Belling, MD 12/25/20 1926

## 2020-12-25 NOTE — ED Notes (Signed)
Pt verbalizes understanding of discharge instructions. Opportunity for questions and answers were provided. Pt discharged from the ED.   ?

## 2020-12-25 NOTE — ED Provider Notes (Signed)
Emergency Medicine Provider Triage Evaluation Note  [name] , a 75 y.o. female  was evaluated in triage.  Pt complains of headache that started after she took glipizide for the first time tonight.  States diffuse throughout her entire head.  She is seen frequently for same. No vomiting, dizziness, fever.  Review of Systems  Positive: headache Negative: Nausea, vomiting  Physical Exam  BP (!) 152/58   Pulse 78   Temp 98.3 F (36.8 C)   Resp 16   SpO2 98%   Gen:   Awake, no distress   Resp:  Normal effort  MSK:   Moves extremities without difficulty  Other:  AAOx3, answering questions, following commands, moving extremities well  Medical Decision Making  Medically screening exam initiated at 6:05 AM.  Appropriate orders placed.  [name] was informed that the remainder of the evaluation will be completed by another provider, this initial triage assessment does not replace that evaluation, and the importance of remaining in the ED until their evaluation is complete.  Headache after taking first dose of glipizide tonight.  Seen frequently for same.  She has had 6 negative head CT's within the past 3 months.  BP is stable, no focal deficits noted in triage.  Appears at her baseline.  Will check screening labs.   Garlon Hatchet, PA-C 12/25/20 1884    Nira Conn, MD 12/25/20 [phone]

## 2020-12-29 ENCOUNTER — Telehealth: Payer: Self-pay | Admitting: Surgery

## 2020-12-29 NOTE — Telephone Encounter (Signed)
ED RNCM spoke with patient concerning [facility] delivery program, patient declined the service for now.  [facility] [address]

## 2021-01-03 ENCOUNTER — Other Ambulatory Visit: Payer: Self-pay

## 2021-01-03 ENCOUNTER — Emergency Department (HOSPITAL_COMMUNITY)
Admission: EM | Admit: 2021-01-03 | Discharge: 2021-01-03 | Disposition: A | Discharge status: Home / Self Care | Payer: Medicare Other | Attending: Emergency Medicine | Admitting: Emergency Medicine

## 2021-01-03 DIAGNOSIS — E119 Type 2 diabetes mellitus without complications: Secondary | ICD-10-CM | POA: Insufficient documentation

## 2021-01-03 DIAGNOSIS — Z79899 Other long term (current) drug therapy: Secondary | ICD-10-CM | POA: Diagnosis not present

## 2021-01-03 DIAGNOSIS — Z8616 Personal history of COVID-19: Secondary | ICD-10-CM | POA: Insufficient documentation

## 2021-01-03 DIAGNOSIS — Z7902 Long term (current) use of antithrombotics/antiplatelets: Secondary | ICD-10-CM | POA: Insufficient documentation

## 2021-01-03 DIAGNOSIS — K219 Gastro-esophageal reflux disease without esophagitis: Secondary | ICD-10-CM | POA: Diagnosis not present

## 2021-01-03 DIAGNOSIS — I10 Essential (primary) hypertension: Secondary | ICD-10-CM | POA: Diagnosis not present

## 2021-01-03 DIAGNOSIS — Z7984 Long term (current) use of oral hypoglycemic drugs: Secondary | ICD-10-CM | POA: Diagnosis not present

## 2021-01-03 LAB — BASIC METABOLIC PANEL
Anion gap: 12 (ref 5–15)
BUN: 11 mg/dL (ref 8–23)
CO2: 27 mmol/L (ref 22–32)
Calcium: 8.8 mg/dL — ABNORMAL LOW (ref 8.9–10.3)
Chloride: 98 mmol/L (ref 98–111)
Creatinine, Ser: 1.07 mg/dL — ABNORMAL HIGH (ref 0.44–1.00)
GFR, Estimated: 55 mL/min — ABNORMAL LOW (ref >60)
Glucose, Bld: 162 mg/dL — ABNORMAL HIGH (ref 70–99)
Potassium: 3.4 mmol/L — ABNORMAL LOW (ref 3.5–5.1)
Sodium: 137 mmol/L (ref 135–145)

## 2021-01-03 LAB — TROPONIN I (HIGH SENSITIVITY)
Troponin I (High Sensitivity): 6 ng/L (ref <18)
Troponin I (High Sensitivity): 6 ng/L (ref <18)

## 2021-01-03 LAB — CBC
HCT: 38.9 % (ref 36.0–46.0)
Hemoglobin: 12.3 g/dL (ref 12.0–15.0)
MCH: 28.4 pg (ref 26.0–34.0)
MCHC: 31.6 g/dL (ref 30.0–36.0)
MCV: 89.8 fL (ref 80.0–100.0)
Platelets: 277 10*3/uL (ref 150–400)
RBC: 4.33 MIL/uL (ref 3.87–5.11)
RDW: 12.4 % (ref 11.5–15.5)
WBC: 6.5 10*3/uL (ref 4.0–10.5)
nRBC: 0 % (ref 0.0–0.2)

## 2021-01-03 MED ORDER — ACETAMINOPHEN 325 MG PO TABS
650.0000 mg | ORAL_TABLET | Freq: Once | ORAL | Status: AC
  Administered 2021-01-03: 650 mg via ORAL
  Filled 2021-01-03: qty 2

## 2021-01-03 NOTE — ED Provider Notes (Signed)
[facility] EMERGENCY DEPARTMENT Provider Note   CSN: [id number] Arrival date & time: 01/03/21  1048     History Chief Complaint  Patient presents with   Chest Pain   Shoulder Pain   Headache    [name] is a 75 y.o. female.  HPI 75 year old female presents with multiple complaints.  Her chief complaint for why she came to the emergency department was high blood pressure.  She states that she was having a headache due to this that is a recurring issue.  Currently the headache is much better.  She states she had some chest pain when she was in the waiting room that is now gone.  It was in her left anterior chest.  She also complains of chronic left hand pain since an injury, chronic nose pain since breaking her nose a couple months ago, and chronic blurry vision.  When asked if she has take anything for the headache she states only her blood pressure medicine.  Past Medical History:  Diagnosis Date   Diabetes mellitus without complication (HCC)    GERD 09/04/2006   Qualifier: Diagnosis of  By: Duke Salvia     History of echocardiogram    a. 2D ECHO: 11/06/2013 EF 65%; no WMA. Mild TR. 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   Numbness 06/20/2012   Diabetes mellitus (HCC) 06/20/2012   CVA (cerebral infarction) 06/20/2012   Hypokalemia 06/20/2012   ARTHRALGIA 01/28/2009   CHEST PAIN UNSPECIFIED 11/26/2008   DEPRESSION 09/04/2006   Essential hypertension 09/04/2006   GERD 09/04/2006   MULTIPLE SITES, ARTHRITIS-ACUTE/CHRONIC  09/04/2006   LOW BACK PAIN, CHRONIC 09/04/2006    Past Surgical History:  Procedure Laterality Date   ABDOMINAL HYSTERECTOMY     ANTERIOR CERVICAL DECOMPRESSION/DISCECTOMY FUSION 4 LEVELS N/A 11/11/2019   Procedure: Cervical three-four Cervical four-five Cervical five-six Cervical six-seven  Anterior cervical decompression/discectomy/fusion;  Surgeon: Maeola Harman, MD;  Location: [facility] OR;  Service: Neurosurgery;  Laterality: N/A;   LEFT HEART CATHETERIZATION WITH CORONARY ANGIOGRAM N/A 11/05/2013   Procedure: LEFT HEART CATHETERIZATION WITH CORONARY ANGIOGRAM;  Surgeon: Lennette Bihari, MD;  Location: [facility] C[facility] LAB;  Service: Cardiovascular;  Laterality: N/A;     OB History   No obstetric history on file.     No family history on file.  Social History   Tobacco Use   Smoking status: Never   Smokeless tobacco: Never  Vaping Use   Vaping Use: Never used  Substance Use Topics   Alcohol use: No   Drug use: No    Home Medications Prior to Admission medications   Medication Sig Start Date End Date Taking? Authorizing Provider  albuterol (VENTOLIN HFA) 108 (90 Base) MCG/ACT inhaler Inhale 1-2 puffs into the lungs every 6 (six) hours as needed for wheezing or shortness of breath. 04/22/20   Barbette Merino, NP  aspirin EC 81 MG tablet Take 81 mg by mouth daily. Swallow whole.    [provider]  chlorthalidone (HYGROTON) 25 MG tablet Take 1 tablet (25 mg total)  by mouth daily. 10/05/20 10/05/21  Vevelyn Francois, NP  clopidogrel (PLAVIX) 75 MG tablet Take 1 tablet (75 mg total) by mouth daily. 04/22/20   Vevelyn Francois, NP  diclofenac Sodium (VOLTAREN) 1 % GEL Apply 2 g topically 4 (four) times daily as needed. 07/27/20   Quintella Reichert, MD  diphenhydramine-acetaminophen (TYLENOL PM) 25-500 MG TABS tablet Take 1 tablet by mouth at bedtime.    [provider]  gabapentin (NEURONTIN) 300 MG capsule Take 1 capsule (300 mg total) by mouth 3 (three) times daily. 10/05/20   Vevelyn Francois, NP  gatifloxacin (ZYMAXID) 0.5 % SOLN Place 1 drop into the right eye 4 (four) times daily. 06/28/20   [provider]  glipiZIDE (GLUCOTROL) 5 MG tablet Take 1 tablet (5 mg total) by mouth 2 (two) times daily before a meal. 04/22/20 04/22/21  Vevelyn Francois, NP  hydrALAZINE (APRESOLINE) 50 MG tablet Take 1 tablet (50 mg total) by mouth 2 (two) times daily. 04/22/20   Vevelyn Francois, NP  ketorolac (ACULAR) 0.5 % ophthalmic solution Place 1 drop into the right eye 4 (four) times daily. 06/28/20   [provider]  lidocaine (LIDODERM) 5 % Place 1 patch onto the skin daily. Remove & Discard patch within 12 hours or as directed by MD    [provider]  losartan (COZAAR) 50 MG tablet Take 1 tablet (50 mg total) by mouth daily. 10/05/20 10/05/21  Vevelyn Francois, NP  metFORMIN (GLUCOPHAGE) 500 MG tablet Take 2 tablets (1,000 mg total) by mouth 2 (two) times daily with a meal. 10/05/20 10/05/21  Vevelyn Francois, NP  mirabegron ER (MYRBETRIQ) 25 MG TB24 tablet Take 25 mg by mouth daily.    [provider]  [facility] VERIO test strip Use 1-4 times daily as directed/needed   DX E11.9 Patient not taking: No sig reported 04/25/20   Vevelyn Francois, NP  potassium chloride (KLOR-CON) 10 MEQ tablet Take 10 mEq by mouth daily.    [provider]  prednisoLONE acetate (PRED FORTE) 1 % ophthalmic suspension Place 1 drop into the right eye 4 (four) times daily. 06/28/20   [provider]  rosuvastatin (CRESTOR) 10 MG tablet Take 1 tablet (10 mg total) by mouth at bedtime. 10/05/20 10/05/21  Vevelyn Francois, NP    Allergies    Codeine, Ibuprofen, and Imdur [isosorbide nitrate]  Review of Systems   Review of Systems  Eyes:  Positive for visual disturbance.  Cardiovascular:  Positive for chest pain.  Gastrointestinal:  Negative for abdominal pain.  Musculoskeletal:  Positive for arthralgias.  Neurological:  Positive for headaches. Negative for weakness and numbness.   All other systems reviewed and are negative.  Physical Exam Updated Vital Signs BP (!) 147/63    Pulse 76    Temp 98 F (36.7 C) (Oral)    Resp 19    SpO2 100%   Physical Exam Vitals and nursing note reviewed.  Constitutional:      Appearance: She is well-developed.  HENT:     Head: Normocephalic and atraumatic.     Right Ear: External ear normal.     Left Ear: External ear normal.     Nose: Nose normal.  Eyes:     General:        Right eye: No discharge.        Left eye: No discharge.     Extraocular Movements: Extraocular movements intact.     Pupils: Pupils are  equal, round, and reactive to light.  Cardiovascular:     Rate and Rhythm: Normal rate and regular rhythm.     Heart sounds: Normal heart sounds.  Pulmonary:     Effort: Pulmonary effort is normal.     Breath sounds: Normal breath sounds.  Abdominal:     Palpations: Abdomen is soft.     Tenderness: There is no abdominal tenderness.  Skin:    General: Skin is warm and dry.  Neurological:     Mental Status: She is alert.     Comments: CN 3-12 grossly intact. 5/5 strength in all 4 extremities. Grossly normal sensation. Normal finger to nose.   Psychiatric:        Mood and Affect: Mood is not anxious.    ED Results / Procedures / Treatments   Labs (all labs ordered are listed, but only abnormal results are displayed) Labs Reviewed  BASIC METABOLIC PANEL - Abnormal; Notable for the following components:      Result Value   Potassium 3.4 (*)    Glucose, Bld 162 (*)    Creatinine, Ser 1.07 (*)    Calcium 8.8 (*)    GFR, Estimated 55 (*)    All other components within normal limits  CBC  TROPONIN I (HIGH SENSITIVITY)  TROPONIN I (HIGH SENSITIVITY)    EKG EKG Interpretation  Date/Time:  Tuesday January 03 2021 11:21:33 EST Ventricular Rate:  77 PR Interval:  126 QRS Duration: 66 QT Interval:  348 QTC Calculation: 393 R Axis:   72 Text Interpretation: Normal sinus rhythm Nonspecific ST abnormality  Abnormal ECG overall similar to Nov 2022 Confirmed by Sherwood Gambler [phone]) on 01/03/2021 3:15:12 PM  Radiology No results found.  Procedures Procedures   Medications Ordered in ED Medications  acetaminophen (TYLENOL) tablet 650 mg (650 mg Oral Given 01/03/21 1541)    ED Course  I have reviewed the triage vital signs and the nursing notes.  Pertinent labs & imaging results that were available during my care of the patient were reviewed by me and considered in my medical decision making (see chart for details).    MDM Rules/Calculators/A&P                           Patient has mostly chronic complaints.  Here her blood pressure was mildly hypertensive on arrival but now is normal.  She was offered Tylenol, which she has not been taking and did not know she could take for headaches.  I have discussed that she should take this at home for headaches if needed and otherwise, I suspect there is no emergent condition ongoing.  ECG has nonspecific changes that are similar to before.  Chest pain is now resolved.  Troponins were obtained in triage, are negative x 2. Highly doubt ACS, PE, dissection. Doubt acute CNS emergency. Discharged with return precautions. Follow up with PCP. Final Clinical Impression(s) / ED Diagnoses Final diagnoses:  Hypertension, unspecified type    Rx / DC Orders ED Discharge Orders     None        Sherwood Gambler, MD 01/03/21 [phone]

## 2021-01-03 NOTE — ED Triage Notes (Signed)
Pt. Stated, I fell last week or the week after , Im having pain in my shoulders, my head and chest. He didn't give me anything for pain.

## 2021-01-03 NOTE — Discharge Instructions (Addendum)
If you develop recurrent, continued, or worsening chest pain, shortness of breath, fever, vomiting, abdominal or back pain, or any other new/concerning symptoms then return to the ER for evaluation.  

## 2021-01-03 NOTE — ED Notes (Signed)
RN reviewed discharge instructions w/ pt. Pt requested taxi voucher or [facility] transport home, RN offered pt a bus pass. Pt refused bus pass, refusing to leave. RN called security, security escorted pt out of ED via wheelchair. Pt d/c w/ bus pass

## 2021-01-03 NOTE — ED Provider Notes (Signed)
Emergency Medicine Provider Triage Evaluation Note  [name] , a 75 y.o. female  was evaluated in triage.  She states that she fell couple weeks ago.  She has had continued pain in these areas.  She complains of headache, neck pain, left shoulder pain.  She has been evaluated numerous times since this happened and has had negative work-ups.  Review of Systems  Positive:  Negative:   Physical Exam  BP (!) 141/90 (BP Location: Right Arm)    Pulse 74    Temp 98.1 F (36.7 C) (Oral)    Resp 14    SpO2 100%  Gen:   Awake, no distress   Resp:  Normal effort  MSK:   Moves extremities without difficulty  Other:  Left shoulder and anterior chest wall tender to the touch.  Medical Decision Making  Medically screening exam initiated at 11:41 AM.  Appropriate orders placed.  [name] was informed that the remainder of the evaluation will be completed by another provider, this initial triage assessment does not replace that evaluation, and the importance of remaining in the ED until their evaluation is complete.   Adriana Leach, PA-C 01/03/21 1142    Mancel Bale, MD 01/03/21 [phone]

## 2021-01-10 ENCOUNTER — Other Ambulatory Visit: Payer: Self-pay

## 2021-01-10 ENCOUNTER — Encounter (HOSPITAL_COMMUNITY): Payer: Self-pay | Admitting: Emergency Medicine

## 2021-01-10 ENCOUNTER — Emergency Department (HOSPITAL_COMMUNITY)
Admission: EM | Admit: 2021-01-10 | Discharge: 2021-01-10 | Disposition: A | Discharge status: Home / Self Care | Payer: Medicare Other | Attending: Emergency Medicine | Admitting: Emergency Medicine

## 2021-01-10 DIAGNOSIS — Z7984 Long term (current) use of oral hypoglycemic drugs: Secondary | ICD-10-CM | POA: Insufficient documentation

## 2021-01-10 DIAGNOSIS — Z8616 Personal history of COVID-19: Secondary | ICD-10-CM | POA: Diagnosis not present

## 2021-01-10 DIAGNOSIS — R531 Weakness: Secondary | ICD-10-CM | POA: Diagnosis not present

## 2021-01-10 DIAGNOSIS — I10 Essential (primary) hypertension: Secondary | ICD-10-CM | POA: Diagnosis not present

## 2021-01-10 DIAGNOSIS — R519 Headache, unspecified: Secondary | ICD-10-CM | POA: Insufficient documentation

## 2021-01-10 DIAGNOSIS — Z79899 Other long term (current) drug therapy: Secondary | ICD-10-CM | POA: Insufficient documentation

## 2021-01-10 DIAGNOSIS — M79605 Pain in left leg: Secondary | ICD-10-CM | POA: Diagnosis not present

## 2021-01-10 DIAGNOSIS — Z7902 Long term (current) use of antithrombotics/antiplatelets: Secondary | ICD-10-CM | POA: Insufficient documentation

## 2021-01-10 DIAGNOSIS — M79604 Pain in right leg: Secondary | ICD-10-CM | POA: Insufficient documentation

## 2021-01-10 DIAGNOSIS — E119 Type 2 diabetes mellitus without complications: Secondary | ICD-10-CM | POA: Insufficient documentation

## 2021-01-10 DIAGNOSIS — R6889 Other general symptoms and signs: Secondary | ICD-10-CM

## 2021-01-10 DIAGNOSIS — H538 Other visual disturbances: Secondary | ICD-10-CM | POA: Insufficient documentation

## 2021-01-10 MED ORDER — LIDOCAINE 5 % EX PTCH: 1.0000 | MEDICATED_PATCH | CUTANEOUS | 0 refills | Status: DC

## 2021-01-10 MED ORDER — ACETAMINOPHEN 325 MG PO TABS
650.0000 mg | ORAL_TABLET | Freq: Once | ORAL | Status: AC
  Administered 2021-01-10: 17:00:00 650 mg via ORAL
  Filled 2021-01-10: qty 2

## 2021-01-10 MED ORDER — MECLIZINE HCL 25 MG PO TABS
25.0000 mg | ORAL_TABLET | Freq: Once | ORAL | Status: AC
  Administered 2021-01-10: 17:00:00 25 mg via ORAL
  Filled 2021-01-10: qty 1

## 2021-01-10 NOTE — ED Triage Notes (Signed)
Patient c/o headache and feeling off balance all week. States she fell last week and was seen at [facility] but the doctor would not give her pain medicine.

## 2021-01-10 NOTE — ED Provider Notes (Signed)
[facility]-EMERGENCY DEPT Provider Note   CSN: [id number] Arrival date & time: 01/10/21  1556     History No chief complaint on file.   [name] is a 75 y.o. female who is here frequently for the same complaint.  She complains of headache, blurry vision and leg pain.  She feels weak in her legs.  She is diabetic.  She states that her primary care doctor's office closed which is why she comes here.  She not found another PCP.  She has no new complaints.  She has been seen for the same in the past.  Multiple CT images are negative.  She has no new complaints today.  HPI     Past Medical History:  Diagnosis Date   Diabetes mellitus without complication (HCC)    GERD 09/04/2006   Qualifier: Diagnosis of  By: Duke Salvia     History of echocardiogram    a. 2D ECHO: 11/06/2013 EF 65%; no WMA. Mild TR. 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   Numbness 06/20/2012   Diabetes mellitus (HCC) 06/20/2012   CVA (cerebral infarction) 06/20/2012   Hypokalemia 06/20/2012   ARTHRALGIA 01/28/2009   CHEST PAIN UNSPECIFIED 11/26/2008   DEPRESSION 09/04/2006   Essential hypertension 09/04/2006   GERD 09/04/2006   MULTIPLE SITES, ARTHRITIS-ACUTE/CHRONIC 09/04/2006   LOW BACK PAIN, CHRONIC 09/04/2006    Past Surgical History:  Procedure Laterality Date   ABDOMINAL HYSTERECTOMY     ANTERIOR CERVICAL DECOMPRESSION/DISCECTOMY FUSION 4 LEVELS N/A 11/11/2019   Procedure: Cervical three-four Cervical four-five Cervical five-six Cervical  six-seven  Anterior cervical decompression/discectomy/fusion;  Surgeon: Maeola Harman, MD;  Location: [facility] OR;  Service: Neurosurgery;  Laterality: N/A;   LEFT HEART CATHETERIZATION WITH CORONARY ANGIOGRAM N/A 11/05/2013   Procedure: LEFT HEART CATHETERIZATION WITH CORONARY ANGIOGRAM;  Surgeon: Lennette Bihari, MD;  Location: [facility] CATH LAB;  Service: Cardiovascular;  Laterality: N/A;     OB History   No obstetric history on file.     History reviewed. No pertinent family history.  Social History   Tobacco Use   Smoking status: Never   Smokeless tobacco: Never  Vaping Use   Vaping Use: Never used  Substance Use Topics   Alcohol use: No   Drug use: No    Home Medications Prior to Admission medications   Medication Sig Start Date End Date Taking? Authorizing Provider  albuterol (VENTOLIN HFA) 108 (90 Base) MCG/ACT inhaler Inhale 1-2 puffs into the lungs every 6 (six) hours as needed for wheezing or shortness of breath. 04/22/20   Barbette Merino, NP  aspirin EC 81 MG tablet Take 81 mg by mouth daily. Swallow whole.    [provider]  chlorthalidone (HYGROTON) 25 MG tablet Take 1 tablet (25 mg total) by mouth daily. 10/05/20 10/05/21  Barbette Merino, NP  clopidogrel (PLAVIX) 75 MG tablet Take 1 tablet (75 mg total) by mouth daily. 04/22/20   Barbette Merino, NP  diclofenac Sodium (VOLTAREN) 1 % GEL Apply 2 g topically 4 (four) times daily  as needed. 07/27/20   Tilden Fossa, MD  diphenhydramine-acetaminophen (TYLENOL PM) 25-500 MG TABS tablet Take 1 tablet by mouth at bedtime.    [provider]  gabapentin (NEURONTIN) 300 MG capsule Take 1 capsule (300 mg total) by mouth 3 (three) times daily. 10/05/20   Barbette Merino, NP  gatifloxacin (ZYMAXID) 0.5 % SOLN Place 1 drop into the right eye 4 (four) times daily. 06/28/20   [provider]  glipiZIDE (GLUCOTROL) 5 MG tablet Take 1 tablet (5 mg total) by mouth 2 (two) times daily before a meal. 04/22/20 04/22/21  Barbette Merino, NP  hydrALAZINE (APRESOLINE) 50 MG tablet Take 1 tablet (50 mg total) by mouth 2 (two) times daily. 04/22/20   Barbette Merino, NP  ketorolac (ACULAR) 0.5 % ophthalmic solution Place 1 drop into the right eye 4 (four) times daily. 06/28/20   [provider]  lidocaine (LIDODERM) 5 % Place 1 patch onto the skin daily. Remove & Discard patch within 12 hours or as directed by MD    [provider]  losartan (COZAAR) 50 MG tablet Take 1 tablet (50 mg total) by mouth daily. 10/05/20 10/05/21  Barbette Merino, NP  metFORMIN (GLUCOPHAGE) 500 MG tablet Take 2 tablets (1,000 mg total) by mouth 2 (two) times daily with a meal. 10/05/20 10/05/21  Barbette Merino, NP  mirabegron ER (MYRBETRIQ) 25 MG TB24 tablet Take 25 mg by mouth daily.    [provider]  [facility] VERIO test strip Use 1-4 times daily as directed/needed   DX E11.9 Patient not taking: No sig reported 04/25/20   Barbette Merino, NP  potassium chloride (KLOR-CON) 10 MEQ tablet Take 10 mEq by mouth daily.    [provider]  prednisoLONE acetate (PRED FORTE) 1 % ophthalmic suspension Place 1 drop into the right eye 4 (four) times daily. 06/28/20   [provider]  rosuvastatin (CRESTOR) 10 MG tablet Take 1 tablet (10 mg total) by mouth at bedtime. 10/05/20 10/05/21  Barbette Merino, NP    Allergies    Codeine, Ibuprofen, and Imdur [isosorbide nitrate]  Review of Systems   Review of Systems Ten systems reviewed and are negative for acute change, except as noted in the HPI.   Physical Exam Updated Vital Signs BP 120/61 (BP Location: Left Arm)    Pulse 64    Resp 17    Ht 5\' 3"  (1.6 m)    Wt 70.3 kg    SpO2 99%    BMI 27.45 kg/m   Physical Exam Vitals and nursing note reviewed.  Constitutional:      General: She is not in acute distress.    Appearance: She is well-developed. She is not diaphoretic.  HENT:     Head: Normocephalic and atraumatic.     Right Ear: External ear normal.     Left Ear:  External ear normal.     Nose: Nose normal.     Mouth/Throat:     Mouth: Mucous membranes are moist.  Eyes:     General: No scleral icterus.    Conjunctiva/sclera: Conjunctivae normal.  Cardiovascular:     Rate and Rhythm: Normal rate and regular rhythm.     Heart sounds: Normal heart sounds. No murmur heard.   No friction rub. No gallop.  Pulmonary:     Effort: Pulmonary effort is normal. No respiratory distress.     Breath sounds: Normal breath sounds.  Abdominal:     General: Bowel  sounds are normal. There is no distension.     Palpations: Abdomen is soft. There is no mass.     Tenderness: There is no abdominal tenderness. There is no guarding.  Musculoskeletal:     Cervical back: Normal range of motion.  Skin:    General: Skin is warm and dry.  Neurological:     Mental Status: She is alert and oriented to person, place, and time.     Cranial Nerves: No cranial nerve deficit.     Sensory: No sensory deficit.     Motor: No weakness.     Coordination: Coordination normal.  Psychiatric:        Behavior: Behavior normal.    ED Results / Procedures / Treatments   Labs (all labs ordered are listed, but only abnormal results are displayed) Labs Reviewed - No data to display  EKG None  Radiology No results found.  Procedures Procedures   Medications Ordered in ED Medications - No data to display  ED Course  I have reviewed the triage vital signs and the nursing notes.  Pertinent labs & imaging results that were available during my care of the patient were reviewed by me and considered in my medical decision making (see chart for details).    MDM Rules/Calculators/A&P                          Patient here with recurrent chronic complaints, she thinks her blood pressure might be high today however it is 120/61.  She was given Tylenol and meclizine.  Patient advised to follow-up with a primary care physician.  She has no chest pain at this time.  I have low suspicion  for ACS dissection, ICH.  Patient referred to the one 800-number in her discharge paperwork to establish primary care.  She is asking for help getting a ride home.  Final Clinical Impression(s) / ED Diagnoses Final diagnoses:  None    Rx / DC Orders ED Discharge Orders     None        Cassity, Christian, PA-C 01/10/21 1717    Alvira Monday, MD 01/11/21 1549

## 2021-01-10 NOTE — Discharge Instructions (Signed)
He is followed closely with a primary care physician.  There is a one 800-number in this package of paperwork that may help you find a primary care physician. You are having a headache. No specific cause was found today for your headache. It may have been a migraine or other cause of headache. Stress, anxiety, fatigue, and depression are common triggers for headaches. Your headache today does not appear to be life-threatening or require hospitalization, but often the exact cause of headaches is not determined in the emergency department. Therefore, follow-up with your doctor is very important to find out what may have caused your headache, and whether or not you need any further diagnostic testing or treatment. Sometimes headaches can appear benign (not harmful), but then more serious symptoms can develop which should prompt an immediate re-evaluation by your doctor or the emergency department. SEEK MEDICAL ATTENTION IF: You develop possible problems with medications prescribed.  The medications don't resolve your headache, if it recurs , or if you have multiple episodes of vomiting or can't take fluids. You have a change from the usual headache. RETURN IMMEDIATELY IF you develop a sudden, severe headache or confusion, become poorly responsive or faint, develop a fever above 100.66F or problem breathing, have a change in speech, vision, swallowing, or understanding, or develop new weakness, numbness, tingling, incoordination, or have a seizure.

## 2021-01-11 ENCOUNTER — Emergency Department (HOSPITAL_COMMUNITY)
Admission: EM | Admit: 2021-01-11 | Discharge: 2021-01-11 | Disposition: A | Discharge status: Home / Self Care | Payer: Medicare Other | Attending: Student | Admitting: Student

## 2021-01-11 ENCOUNTER — Encounter (HOSPITAL_COMMUNITY): Payer: Self-pay

## 2021-01-11 ENCOUNTER — Other Ambulatory Visit: Payer: Self-pay

## 2021-01-11 DIAGNOSIS — Z7982 Long term (current) use of aspirin: Secondary | ICD-10-CM | POA: Insufficient documentation

## 2021-01-11 DIAGNOSIS — G629 Polyneuropathy, unspecified: Secondary | ICD-10-CM

## 2021-01-11 DIAGNOSIS — E1140 Type 2 diabetes mellitus with diabetic neuropathy, unspecified: Secondary | ICD-10-CM | POA: Insufficient documentation

## 2021-01-11 DIAGNOSIS — Z7984 Long term (current) use of oral hypoglycemic drugs: Secondary | ICD-10-CM | POA: Diagnosis not present

## 2021-01-11 DIAGNOSIS — I10 Essential (primary) hypertension: Secondary | ICD-10-CM | POA: Insufficient documentation

## 2021-01-11 DIAGNOSIS — R519 Headache, unspecified: Secondary | ICD-10-CM | POA: Diagnosis present

## 2021-01-11 DIAGNOSIS — Z8616 Personal history of COVID-19: Secondary | ICD-10-CM | POA: Diagnosis not present

## 2021-01-11 DIAGNOSIS — H538 Other visual disturbances: Secondary | ICD-10-CM | POA: Insufficient documentation

## 2021-01-11 DIAGNOSIS — Z79899 Other long term (current) drug therapy: Secondary | ICD-10-CM | POA: Diagnosis not present

## 2021-01-11 LAB — CBG MONITORING, ED: Glucose-Capillary: 96 mg/dL (ref 70–99)

## 2021-01-11 MED ORDER — GABAPENTIN 300 MG PO CAPS
300.0000 mg | ORAL_CAPSULE | Freq: Once | ORAL | Status: AC
Start: 2021-01-11 — End: 2021-01-11
  Administered 2021-01-11: 18:00:00 300 mg via ORAL
  Filled 2021-01-11: qty 1

## 2021-01-11 NOTE — ED Triage Notes (Signed)
Pt reports headache and leg swelling that has been going on for a "long time". Pt was seen here yesterday for similar.

## 2021-01-11 NOTE — ED Notes (Signed)
Patient refusing to leave room regardless of discharge.

## 2021-01-11 NOTE — ED Provider Notes (Signed)
[facility] DEPT Provider Note   CSN: [id number] Arrival date & time: 01/11/21  1247     History Chief Complaint  Patient presents with   Headache   Leg Swelling    [name] is a 75 y.o. female with PMH diabetes with diabetic neuropathy, HTN who presents emergency department for evaluation of multiple complaints including headache, hand pain and feet pain.  This is the patient's fourth presentation this month for similar complaints and she was seen 4 times last month for the same.  She arrives stating that she has not been taking her medication because "if I take all of them my stomach hurts".  On looking at her medications, it appears that she has multiple refilled prescriptions for gabapentin and her metformin that she has not been taking.  She states that she is having blurry vision that she deals with constantly and has been dealing with for many months.  She states this is not a new symptom today.  Endorses headache but denies numbness, tingling, weakness or other neurologic complaints.  Headache 5 out of 10, bilateral squeezing.  Of note, out reach nursing has reached out to the patient about the community care management program and patient has declined the service for now.    Headache Associated symptoms: myalgias   Associated symptoms: no abdominal pain, no back pain, no cough, no ear pain, no eye pain, no fever, no seizures, no sore throat and no vomiting       Past Medical History:  Diagnosis Date   Diabetes mellitus without complication ([place])    GERD 09/04/2006   Qualifier: Diagnosis of  By: Suzie Portela     History of echocardiogram    a. 2D ECHO: 11/06/2013 EF 65%; no WMA. Mild TR. 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   Numbness 06/20/2012   Diabetes mellitus ([place]) 06/20/2012   CVA (cerebral infarction) 06/20/2012   Hypokalemia 06/20/2012   ARTHRALGIA 01/28/2009   CHEST PAIN UNSPECIFIED 11/26/2008   DEPRESSION 09/04/2006   Essential hypertension 09/04/2006   GERD 09/04/2006   MULTIPLE SITES, ARTHRITIS-ACUTE/CHRONIC 09/04/2006   LOW BACK PAIN, CHRONIC 09/04/2006    Past Surgical History:  Procedure Laterality Date   ABDOMINAL HYSTERECTOMY     ANTERIOR CERVICAL DECOMPRESSION/DISCECTOMY FUSION 4 LEVELS N/A 11/11/2019   Procedure: Cervical three-four Cervical four-five Cervical five-six Cervical six-seven  Anterior cervical decompression/discectomy/fusion;  Surgeon: Erline Levine, MD;  Location: [facility];  Service: Neurosurgery;  Laterality: N/A;   LEFT HEART CATHETERIZATION WITH CORONARY ANGIOGRAM N/A 11/05/2013   Procedure: LEFT HEART CATHETERIZATION WITH CORONARY ANGIOGRAM;  Surgeon: Troy Sine, MD;  Location: [facility] CATH LAB;  Service: Cardiovascular;  Laterality: N/A;     OB History   No obstetric history on file.     History reviewed. No pertinent family history.  Social History   Tobacco Use   Smoking status: Never   Smokeless tobacco: Never  Vaping Use   Vaping Use: Never used  Substance Use Topics   Alcohol use: No   Drug use:  No    Home Medications Prior to Admission medications   Medication Sig Start Date End Date Taking? Authorizing Provider  albuterol (VENTOLIN HFA) 108 (90 Base) MCG/ACT inhaler Inhale 1-2 puffs into the lungs every 6 (six) hours as needed for wheezing or shortness of breath. 04/22/20   Vevelyn Francois, NP  aspirin EC 81 MG tablet Take 81 mg by mouth daily. Swallow whole.    [provider]  chlorthalidone (HYGROTON) 25 MG tablet  Take 1 tablet (25 mg total) by mouth daily. 10/05/20 10/05/21  Vevelyn Francois, NP  clopidogrel (PLAVIX) 75 MG tablet Take 1 tablet (75 mg total) by mouth daily. 04/22/20   Vevelyn Francois, NP  diclofenac Sodium (VOLTAREN) 1 % GEL Apply 2 g topically 4 (four) times daily as needed. 07/27/20   Quintella Reichert, MD  diphenhydramine-acetaminophen (TYLENOL PM) 25-500 MG TABS tablet Take 1 tablet by mouth at bedtime.    [provider]  gabapentin (NEURONTIN) 300 MG capsule Take 1 capsule (300 mg total) by mouth 3 (three) times daily. 10/05/20   Vevelyn Francois, NP  gatifloxacin (ZYMAXID) 0.5 % SOLN Place 1 drop into the right eye 4 (four) times daily. 06/28/20   [provider]  glipiZIDE (GLUCOTROL) 5 MG tablet Take 1 tablet (5 mg total) by mouth 2 (two) times daily before a meal. 04/22/20 04/22/21  Vevelyn Francois, NP  hydrALAZINE (APRESOLINE) 50 MG tablet Take 1 tablet (50 mg total) by mouth 2 (two) times daily. 04/22/20   Vevelyn Francois, NP  ketorolac (ACULAR) 0.5 % ophthalmic solution Place 1 drop into the right eye 4 (four) times daily. 06/28/20   [provider]  lidocaine (LIDODERM) 5 % Place 1 patch onto the skin daily. Remove & Discard patch within 12 hours or as directed by MD 01/10/21   Margarita Mail, PA-C  losartan (COZAAR) 50 MG tablet Take 1 tablet (50 mg total) by mouth daily. 10/05/20 10/05/21  Vevelyn Francois, NP  metFORMIN (GLUCOPHAGE) 500 MG tablet Take 2 tablets (1,000 mg total) by mouth 2 (two) times daily with a meal. 10/05/20 10/05/21  Vevelyn Francois, NP  mirabegron ER (MYRBETRIQ) 25 MG TB24 tablet Take 25 mg by mouth daily.    [provider]  [facility] VERIO test strip Use 1-4 times daily as directed/needed   DX E11.9 Patient not taking: No sig reported 04/25/20   Vevelyn Francois, NP  potassium chloride (KLOR-CON) 10 MEQ tablet Take 10 mEq by mouth daily.    [provider]  prednisoLONE acetate (PRED FORTE) 1 % ophthalmic suspension Place 1 drop into the  right eye 4 (four) times daily. 06/28/20   [provider]  rosuvastatin (CRESTOR) 10 MG tablet Take 1 tablet (10 mg total) by mouth at bedtime. 10/05/20 10/05/21  Vevelyn Francois, NP    Allergies    Codeine, Ibuprofen, and Imdur [isosorbide nitrate]  Review of Systems   Review of Systems  Constitutional:  Negative for chills and fever.  HENT:  Negative for ear pain and sore throat.   Eyes:  Negative for pain and visual disturbance.  Respiratory:  Negative for cough and shortness of breath.   Cardiovascular:  Negative for chest pain and palpitations.  Gastrointestinal:  Negative for abdominal pain and vomiting.  Genitourinary:  Negative for dysuria and hematuria.  Musculoskeletal:  Positive for arthralgias and myalgias. Negative for back pain.  Skin:  Negative for color change and rash.  Neurological:  Positive for headaches.  Negative for seizures and syncope.  All other systems reviewed and are negative.  Physical Exam Updated Vital Signs BP (!) 148/68    Pulse 68    Temp 98.2 F (36.8 C) (Oral)    Resp 18    Ht 5\' 5"  (1.651 m)    Wt 70.3 kg    SpO2 100%    BMI 25.79 kg/m   Physical Exam Vitals and nursing note reviewed.  Constitutional:      General: She is not in acute distress.    Appearance: She is well-developed.  HENT:     Head: Normocephalic and atraumatic.  Eyes:     Conjunctiva/sclera: Conjunctivae normal.  Cardiovascular:     Rate and Rhythm: Normal rate and regular rhythm.     Heart sounds: No murmur heard. Pulmonary:     Effort: Pulmonary effort is normal. No respiratory distress.     Breath sounds: Normal breath sounds.  Abdominal:     Palpations: Abdomen is soft.     Tenderness: There is no abdominal tenderness.  Musculoskeletal:        General: No swelling.     Cervical back: Neck supple.  Skin:    General: Skin is warm and dry.     Capillary Refill: Capillary refill takes less than 2 seconds.  Neurological:     Mental Status: She is alert.      Cranial Nerves: No cranial nerve deficit or dysarthria.     Sensory: No sensory deficit.     Motor: No weakness.  Psychiatric:        Mood and Affect: Mood normal.    ED Results / Procedures / Treatments   Labs (all labs ordered are listed, but only abnormal results are displayed) Labs Reviewed  CBG MONITORING, ED    EKG None  Radiology No results found.  Procedures Procedures   Medications Ordered in ED Medications  gabapentin (NEURONTIN) capsule 300 mg (300 mg Oral Given 01/11/21 1738)    ED Course  I have reviewed the triage vital signs and the nursing notes.  Pertinent labs & imaging results that were available during my care of the patient were reviewed by me and considered in my medical decision making (see chart for details).    MDM Rules/Calculators/A&P                          Patient seen emergency department for evaluation of headache, hand and arm pain, blurry vision.  Physical exam is unremarkable with no focal motor or sensory deficits.  She has no upper extremity or lower extremity weakness and although the patient has a history of cervical stenosis status post neurosurgical intervention, her symptoms fit better with diabetic neuropathy and I have overall low suspicion for compressive pathology at this time.  On previous chart review, it appears that patient does have a primary care physician but has not seen this provider in a long time, and hemoglobin A1c was as high as 10.5 in April 2022, apparently decreased to 5.7 in September.  Her blood sugar in the emergency department today is 55 and she is not diabetic crisis today.  In regards to her blurry vision, she states that she has had this for multiple weeks.  In the setting of her diabetic neuropathy, her blurry vision is almost certainly due to diabetic retinopathy and she has never seen an ophthalmologist to have this diagnosed.  The ophthalmology clinic resources were provided to this patient  and she was  instructed that she needs to follow-up with ophthalmology or her vision will get worse.  She does not have any emergent pathology here in the emergency department and her neuropathy was improved with gabapentin here.  At this time she is safe for discharge and she will be discharged from the emergency department with outpatient follow-up for medication management, ophthalmology evaluation for diabetic retinopathy.   Final Clinical Impression(s) / ED Diagnoses Final diagnoses:  Neuropathy  Blurry vision    Rx / DC Orders ED Discharge Orders     None        Nygeria Lager, Wyn Forster, MD 01/11/21 [phone]

## 2021-01-11 NOTE — ED Provider Notes (Signed)
Emergency Medicine Provider Triage Evaluation Note  [name] , a 75 y.o. female  was evaluated in triage.  Pt complains of headache and pain to bilateral lower legs.  Reports that headache has been constant over the last week.  Pain is located to frontotemporal aspect of her head.  Patient denies any aggravating or alleviating factors.  Patient endorses associated blurry vision bilaterally.  Denies any numbness, weakness, facial asymmetry or dysarthria.  States that this headache is similar to headaches that she has had in the past.  Patient complains of pain to bilateral lower legs and feet.  Patient states that this pain has been present for "a long time."  Patient denies any change in her pain.  Denies any recent falls or injuries.  Review of Systems  Positive: Headache, leg and foot pain bilaterally, blurry vision Negative: Numbness, weakness, facial asymmetry, dysarthria, leg swelling  Physical Exam  BP (!) 128/54 (BP Location: Left Arm)    Pulse 72    Temp 97.6 F (36.4 C) (Oral)    Resp 18    SpO2 97%  Gen:   Awake, no distress   Resp:  Normal effort  MSK:   Moves extremities without difficulty  Other:  Patient moves all limbs equally without difficulty.  No dysarthria.  No swelling to bilateral lower extremities.  Medical Decision Making  Medically screening exam initiated at 1:26 PM.  Appropriate orders placed.  [name] was informed that the remainder of the evaluation will be completed by another provider, this initial triage assessment does not replace that evaluation, and the importance of remaining in the ED until their evaluation is complete.     Haskel Schroeder, PA-C 01/11/21 1328    Mancel Bale, MD 01/12/21 [phone]

## 2021-01-11 NOTE — Discharge Instructions (Addendum)
You were seen in the emergency department for evaluation of multiple complaints including blurry vision, hand and lower extremity pain.  It is very important that you call your primary care physician because I am concerned about your ability to take your medications appropriately, and if you have not been taking her medications I am worried that your diabetes may be getting worse.  Your blurry vision is likely related to diabetic retinopathy and you will need to call the ophthalmologist above to have your eyes examined.  At this time you are safe for discharge.

## 2021-01-11 NOTE — ED Notes (Signed)
Patient taken to lobby in wheelchair. Security to escort patient to bus stop.

## 2021-01-25 ENCOUNTER — Other Ambulatory Visit: Payer: Self-pay

## 2021-01-25 ENCOUNTER — Encounter (HOSPITAL_COMMUNITY): Payer: Self-pay | Admitting: Emergency Medicine

## 2021-01-25 ENCOUNTER — Emergency Department (HOSPITAL_COMMUNITY): Payer: Medicare Other

## 2021-01-25 ENCOUNTER — Emergency Department (HOSPITAL_COMMUNITY)
Admission: EM | Admit: 2021-01-25 | Discharge: 2021-01-25 | Disposition: A | Discharge status: Home / Self Care | Payer: Medicare Other | Attending: Emergency Medicine | Admitting: Emergency Medicine

## 2021-01-25 DIAGNOSIS — E119 Type 2 diabetes mellitus without complications: Secondary | ICD-10-CM | POA: Insufficient documentation

## 2021-01-25 DIAGNOSIS — H538 Other visual disturbances: Secondary | ICD-10-CM | POA: Diagnosis not present

## 2021-01-25 DIAGNOSIS — Z79899 Other long term (current) drug therapy: Secondary | ICD-10-CM | POA: Diagnosis not present

## 2021-01-25 DIAGNOSIS — R519 Headache, unspecified: Secondary | ICD-10-CM

## 2021-01-25 DIAGNOSIS — Z7984 Long term (current) use of oral hypoglycemic drugs: Secondary | ICD-10-CM | POA: Diagnosis not present

## 2021-01-25 DIAGNOSIS — H539 Unspecified visual disturbance: Secondary | ICD-10-CM

## 2021-01-25 DIAGNOSIS — I10 Essential (primary) hypertension: Secondary | ICD-10-CM | POA: Insufficient documentation

## 2021-01-25 DIAGNOSIS — Z7902 Long term (current) use of antithrombotics/antiplatelets: Secondary | ICD-10-CM | POA: Insufficient documentation

## 2021-01-25 DIAGNOSIS — R030 Elevated blood-pressure reading, without diagnosis of hypertension: Secondary | ICD-10-CM

## 2021-01-25 DIAGNOSIS — Z7982 Long term (current) use of aspirin: Secondary | ICD-10-CM | POA: Diagnosis not present

## 2021-01-25 MED ORDER — ACETAMINOPHEN 325 MG PO TABS
650.0000 mg | ORAL_TABLET | Freq: Once | ORAL | Status: AC
  Administered 2021-01-25: 650 mg via ORAL
  Filled 2021-01-25: qty 2

## 2021-01-25 NOTE — ED Triage Notes (Signed)
Patient arrived with EMS from home reports headache for 2 days , denies head injury , no emesis or photophobia .

## 2021-01-25 NOTE — ED Notes (Signed)
Pt with multiple vague complaints, states her "eyes are blurry" and asking for "medicine from the doctor" to fix it. States her vision has been bad for "a long time." Pt also reporting a tooth ache, soreness from a previous fall, a headache, and shoulder pain. Pt received tylenol for pain. PA aware of multiple complaints.

## 2021-01-25 NOTE — ED Provider Notes (Signed)
Emergency Medicine Provider Triage Evaluation Note  [name] , a 76 y.o. female  was evaluated in triage.  Pt complains of headache.  She states that she has had headache for 4 to 5 days.  She states that she feels like this when her blood pressure goes up.  She denies cough, fever, shortness of breath.  Review of Systems  Positive: See above Negative:   Physical Exam  BP (!) 166/72 (BP Location: Left Arm)    Pulse 71    Temp 97.7 F (36.5 C) (Oral)    Resp 16    SpO2 100%  Gen:   Awake, no distress   Resp:  Normal effort  MSK:   Moves extremities without difficulty  Other:  Normal speech, 2+ PT pulses bilaterally.   Medical Decision Making  Medically screening exam initiated at 2:01 AM.  Appropriate orders placed.  Brylei Precourt was informed that the remainder of the evaluation will be completed by another provider, this initial triage assessment does not replace that evaluation, and the importance of remaining in the ED until their evaluation is complete.  Patient with frequent ED visits.  She is hypertensive and does report headache, therefore will obtain CT head.      Lorin Glass, PA-C 01/25/21 0202    Fatima Blank, MD 01/27/21 [phone]

## 2021-01-25 NOTE — ED Provider Notes (Signed)
[facility]ERGENCY DEPARTMENT Provider Note   CSN: [id number] Arrival date & time: 01/25/21  0133     History  Chief Complaint  Patient presents with   Headache    [name] is a 76 y.o. female.  76 year old female presents today for evaluation of elevated blood pressure, headache, and blurry vision.  She reports her blurry vision has been ongoing for over a month and was seen by an eye doctor and was given eyedrops without any improvement in her symptoms.  She reports over the past 5 or 6 days she also has some headache and elevated blood pressure.  She reports compliance with her home medications though she is unable to recall prescription names.  She denies chest pain, shortness of breath, abdominal pain, lightheadedness, or weakness.  The history is provided by the patient. No language interpreter was used.      Home Medications Prior to Admission medications   Medication Sig Start Date End Date Taking? Authorizing Provider  albuterol (VENTOLIN HFA) 108 (90 Base) MCG/ACT inhaler Inhale 1-2 puffs into the lungs every 6 (six) hours as needed for wheezing or shortness of breath. 04/22/20   Vevelyn Francois, NP  aspirin EC 81 MG tablet Take 81 mg by mouth daily. Swallow whole.    [provider]  chlorthalidone (HYGROTON) 25 MG tablet Take 1 tablet (25 mg total) by mouth daily. 10/05/20 10/05/21  Vevelyn Francois, NP  clopidogrel (PLAVIX) 75 MG tablet Take 1 tablet (75 mg total) by mouth daily. 04/22/20   Vevelyn Francois, NP  diclofenac Sodium (VOLTAREN) 1 % GEL Apply 2 g topically 4 (four) times daily as needed. 07/27/20   Quintella Reichert, MD  diphenhydramine-acetaminophen (TYLENOL PM) 25-500 MG TABS tablet Take 1 tablet by mouth at bedtime.    [provider]  gabapentin (NEURONTIN) 300 MG capsule Take 1 capsule (300 mg total) by mouth 3 (three) times daily. 10/05/20   Vevelyn Francois, NP  gatifloxacin (ZYMAXID) 0.5 % SOLN Place 1 drop into the right  eye 4 (four) times daily. 06/28/20   [provider]  glipiZIDE (GLUCOTROL) 5 MG tablet Take 1 tablet (5 mg total) by mouth 2 (two) times daily before a meal. 04/22/20 04/22/21  Vevelyn Francois, NP  hydrALAZINE (APRESOLINE) 50 MG tablet Take 1 tablet (50 mg total) by mouth 2 (two) times daily. 04/22/20   Vevelyn Francois, NP  ketorolac (ACULAR) 0.5 % ophthalmic solution Place 1 drop into the right eye 4 (four) times daily. 06/28/20   [provider]  lidocaine (LIDODERM) 5 % Place 1 patch onto the skin daily. Remove & Discard patch within 12 hours or as directed by MD 01/10/21   Margarita Mail, PA-C  losartan (COZAAR) 50 MG tablet Take 1 tablet (50 mg total) by mouth daily. 10/05/20 10/05/21  Vevelyn Francois, NP  metFORMIN (GLUCOPHAGE) 500 MG tablet Take 2 tablets (1,000 mg total) by mouth 2 (two) times daily with a meal. 10/05/20 10/05/21  Vevelyn Francois, NP  mirabegron ER (MYRBETRIQ) 25 MG TB24 tablet Take 25 mg by mouth daily.    [provider]  [facility] VERIO test strip Use 1-4 times daily as directed/needed   DX E11.9 Patient not taking: No sig reported 04/25/20   Vevelyn Francois, NP  potassium chloride (KLOR-CON) 10 MEQ tablet Take 10 mEq by mouth daily.    [provider]  prednisoLONE acetate (PRED FORTE) 1 % ophthalmic suspension Place 1 drop into  the right eye 4 (four) times daily. 06/28/20   [provider]  rosuvastatin (CRESTOR) 10 MG tablet Take 1 tablet (10 mg total) by mouth at bedtime. 10/05/20 10/05/21  Vevelyn Francois, NP      Allergies    Codeine, Ibuprofen, and Imdur [isosorbide nitrate]    Review of Systems   Review of Systems  Constitutional:  Negative for activity change, chills and fever.  Eyes:  Positive for visual disturbance.  Respiratory:  Negative for shortness of breath.   Cardiovascular:  Negative for chest pain and palpitations.  Gastrointestinal:  Negative for abdominal pain, nausea and vomiting.  Neurological:  Positive for  headaches. Negative for syncope, weakness, light-headedness and numbness.  All other systems reviewed and are negative.  Physical Exam Updated Vital Signs BP (!) 152/60 (BP Location: Left Arm)    Pulse 68    Temp 98 F (36.7 C) (Oral)    Resp 16    SpO2 98%  Physical Exam Vitals and nursing note reviewed.  Constitutional:      General: She is not in acute distress.    Appearance: Normal appearance. She is not ill-appearing.  HENT:     Head: Normocephalic and atraumatic.     Comments: Without tenderness to palpation over either temporal regions.    Nose: Nose normal.  Eyes:     General: No scleral icterus.    Extraocular Movements: Extraocular movements intact.     Conjunctiva/sclera: Conjunctivae normal.  Cardiovascular:     Rate and Rhythm: Normal rate and regular rhythm.     Pulses: Normal pulses.     Heart sounds: Normal heart sounds.  Pulmonary:     Effort: Pulmonary effort is normal. No respiratory distress.     Breath sounds: Normal breath sounds. No wheezing or rales.  Abdominal:     General: There is no distension.     Tenderness: There is no abdominal tenderness.  Musculoskeletal:        General: No deformity. Normal range of motion.     Cervical back: Normal range of motion.  Skin:    General: Skin is warm and dry.     Findings: No rash.  Neurological:     General: No focal deficit present.     Mental Status: She is alert. Mental status is at baseline.     Cranial Nerves: No cranial nerve deficit.     Sensory: No sensory deficit.     Comments: Full range of motion bilateral upper and lower extremities.  Without pronator drift.  5/5 strength in bilateral upper extremities.  5/5 strength bilateral lower extremities.  2+ radial pulse present.  Sensation intact in bilateral upper and lower extremities and symmetrical.    ED Results / Procedures / Treatments   Labs (all labs ordered are listed, but only abnormal results are displayed) Labs Reviewed - No data to  display  EKG None  Radiology CT HEAD WO CONTRAST (5MM)  Result Date: 01/25/2021 CLINICAL DATA:  Headache, new or worsening. EXAM: CT HEAD WITHOUT CONTRAST TECHNIQUE: Contiguous axial images were obtained from the base of the skull through the vertex without intravenous contrast. COMPARISON:  12/19/2020. FINDINGS: Brain: No acute intracranial hemorrhage, midline shift or mass effect. No extra-axial fluid collection. Gray-white matter differentiation is within normal limits and there is no hydrocephalus. Mild periventricular white matter hypodensities are noted bilaterally. Vascular: No hyperdense vessel or unexpected calcification. Skull: Normal. Negative for fracture or focal lesion. Sinuses/Orbits: No acute finding. Other: None. IMPRESSION: No  acute intracranial process. Electronically Signed   By: Brett Fairy M.D.   On: 01/25/2021 02:50    Procedures Procedures    Medications Ordered in ED Medications  acetaminophen (TYLENOL) tablet 650 mg (has no administration in time range)    ED Course/ Medical Decision Making/ A&P                           Medical Decision Making  This patient presents to the ED for concern of elevated blood pressure, headache, and blurry vision, this involves an extensive number of treatment options, and is a complaint that carries with it a high risk of complications and morbidity.  The differential diagnosis includes but not limited to CVA, hypertensive emergency, temporal arteritis, stress-induced headache, tension headache, migraine   Co morbidities that complicate the patient evaluation  History of CVA, hypertension, diabetes   Imaging Studies ordered:  I ordered imaging studies including CT head I independently visualized and interpreted imaging which showed no acute intracranial process I agree with the radiologist interpretation   Medicines ordered and prescription drug management:  I ordered medication including Tylenol for  headache Reevaluation of the patient after these medicines showed that the patient improved I have reviewed the patients home medicines and have made adjustments as needed  Problem List / ED Course:  Headache, elevated blood pressure, blurry vision Headache improved following Tylenol.  Patient did not take any medicines prior to arrival for this. Elevated blood pressure: Patient's blood pressure in the emergency room was mildly elevated.  Unlikely to be hypertensive emergency.  Patient without chest pain, shortness of breath.  Vision change does not appear to be new since onset of headache 5 days ago.  Of note patient has had multiple visits to the emergency room with similar complaints including elevated blood pressure, headache, and blurry vision.  Patient was instructed to establish PCP but has not yet. Blurry vision-ongoing issue.  States she has followed up with ophthalmology and was given eyedrops without improvement.  Temporal arteritis considered however given no tenderness palpation over temporal arteries this is less likely.  Headache also improved following Tylenol.   Reevaluation:  After the interventions noted above, I reevaluated the patient and found that they have :improved  Dispostion:  After consideration of the diagnostic results and the patients response to treatment, I feel that the patent would benefit from establishing outpatient primary care provider.  Patient is safe for discharge.  Strict return precautions discussed with patient.  Also provided patient with resource guide with primary care clinics out in the community.  Discussed following up with ophthalmologist for her blurry vision.  Patient voices understanding and is in agreement with plan.  Final Clinical Impression(s) / ED Diagnoses Final diagnoses:  Elevated blood pressure reading  Nonintractable headache, unspecified chronicity pattern, unspecified headache type  Vision changes    Rx / DC Orders ED  Discharge Orders     None         Evlyn Courier, PA-C 01/25/21 1438    Hayden Rasmussen, MD 01/25/21 1753

## 2021-01-25 NOTE — ED Notes (Signed)
Pt verbalized understanding of d/c instructions, meds and followup care. Denies questions. VSS, no distress noted. Pt was very upset that she did not receive cab voucher or ambulance home, but SW [place] told this RN that pt eligible for bus pass only at this time. Pt was agitated but did exit facility via W/C to exit with all belongings.

## 2021-01-25 NOTE — Discharge Instructions (Addendum)
Your blood pressure in the emergency room has been relatively stable.  Continue taking your home medications.  I have attached a resource sheet for you to set up a primary care provider.  This is imperative for you to continue to have consistent management of your blood pressure and other symptoms.  Your headache improved after receiving Tylenol.  You can continue to take Tylenol at home for headache.  Please follow-up with your ophthalmologist for your vision change.  If your symptoms worsen return to the emergency room.

## 2021-01-30 ENCOUNTER — Other Ambulatory Visit: Payer: Self-pay

## 2021-01-30 ENCOUNTER — Emergency Department (HOSPITAL_COMMUNITY): Payer: Medicare Other

## 2021-01-30 ENCOUNTER — Emergency Department (HOSPITAL_COMMUNITY)
Admission: EM | Admit: 2021-01-30 | Discharge: 2021-01-31 | Disposition: A | Discharge status: Home / Self Care | Payer: Medicare Other | Attending: Emergency Medicine | Admitting: Emergency Medicine

## 2021-01-30 DIAGNOSIS — I10 Essential (primary) hypertension: Secondary | ICD-10-CM | POA: Insufficient documentation

## 2021-01-30 DIAGNOSIS — E119 Type 2 diabetes mellitus without complications: Secondary | ICD-10-CM | POA: Insufficient documentation

## 2021-01-30 DIAGNOSIS — R531 Weakness: Secondary | ICD-10-CM | POA: Diagnosis not present

## 2021-01-30 DIAGNOSIS — D649 Anemia, unspecified: Secondary | ICD-10-CM | POA: Diagnosis not present

## 2021-01-30 DIAGNOSIS — R519 Headache, unspecified: Secondary | ICD-10-CM | POA: Insufficient documentation

## 2021-01-30 DIAGNOSIS — H538 Other visual disturbances: Secondary | ICD-10-CM | POA: Insufficient documentation

## 2021-01-30 DIAGNOSIS — Z7984 Long term (current) use of oral hypoglycemic drugs: Secondary | ICD-10-CM | POA: Diagnosis not present

## 2021-01-30 DIAGNOSIS — R079 Chest pain, unspecified: Secondary | ICD-10-CM | POA: Diagnosis present

## 2021-01-30 DIAGNOSIS — Z7902 Long term (current) use of antithrombotics/antiplatelets: Secondary | ICD-10-CM | POA: Insufficient documentation

## 2021-01-30 DIAGNOSIS — N289 Disorder of kidney and ureter, unspecified: Secondary | ICD-10-CM | POA: Insufficient documentation

## 2021-01-30 DIAGNOSIS — Z79899 Other long term (current) drug therapy: Secondary | ICD-10-CM | POA: Insufficient documentation

## 2021-01-30 DIAGNOSIS — Z5321 Procedure and treatment not carried out due to patient leaving prior to being seen by health care provider: Secondary | ICD-10-CM | POA: Insufficient documentation

## 2021-01-30 DIAGNOSIS — R262 Difficulty in walking, not elsewhere classified: Secondary | ICD-10-CM | POA: Diagnosis not present

## 2021-01-30 DIAGNOSIS — Z7982 Long term (current) use of aspirin: Secondary | ICD-10-CM | POA: Insufficient documentation

## 2021-01-30 LAB — BASIC METABOLIC PANEL
Anion gap: 9 (ref 5–15)
BUN: 24 mg/dL — ABNORMAL HIGH (ref 8–23)
CO2: 28 mmol/L (ref 22–32)
Calcium: 8.5 mg/dL — ABNORMAL LOW (ref 8.9–10.3)
Chloride: 96 mmol/L — ABNORMAL LOW (ref 98–111)
Creatinine, Ser: 1.47 mg/dL — ABNORMAL HIGH (ref 0.44–1.00)
GFR, Estimated: 37 mL/min — ABNORMAL LOW (ref >60)
Glucose, Bld: 117 mg/dL — ABNORMAL HIGH (ref 70–99)
Potassium: 3.6 mmol/L (ref 3.5–5.1)
Sodium: 133 mmol/L — ABNORMAL LOW (ref 135–145)

## 2021-01-30 LAB — CBC WITH DIFFERENTIAL/PLATELET
Abs Immature Granulocytes: 0.02 10*3/uL (ref 0.00–0.07)
Basophils Absolute: 0 10*3/uL (ref 0.0–0.1)
Basophils Relative: 0 %
Eosinophils Absolute: 0.1 10*3/uL (ref 0.0–0.5)
Eosinophils Relative: 1 %
HCT: 32.8 % — ABNORMAL LOW (ref 36.0–46.0)
Hemoglobin: 10.4 g/dL — ABNORMAL LOW (ref 12.0–15.0)
Immature Granulocytes: 0 %
Lymphocytes Relative: 22 %
Lymphs Abs: 1.9 10*3/uL (ref 0.7–4.0)
MCH: 28.7 pg (ref 26.0–34.0)
MCHC: 31.7 g/dL (ref 30.0–36.0)
MCV: 90.6 fL (ref 80.0–100.0)
Monocytes Absolute: 0.7 10*3/uL (ref 0.1–1.0)
Monocytes Relative: 8 %
Neutro Abs: 6.2 10*3/uL (ref 1.7–7.7)
Neutrophils Relative %: 69 %
Platelets: 254 10*3/uL (ref 150–400)
RBC: 3.62 MIL/uL — ABNORMAL LOW (ref 3.87–5.11)
RDW: 13 % (ref 11.5–15.5)
WBC: 8.9 10*3/uL (ref 4.0–10.5)
nRBC: 0 % (ref 0.0–0.2)

## 2021-01-30 LAB — TROPONIN I (HIGH SENSITIVITY): Troponin I (High Sensitivity): 7 ng/L (ref <18)

## 2021-01-30 NOTE — ED Triage Notes (Signed)
Pt c/o left side chest pain and headache for the past week. Denies SOB. Pt also has several other complaints including blurry vision, increased weakness, and difficulty walking.

## 2021-01-30 NOTE — ED Provider Triage Note (Signed)
Emergency Medicine Provider Triage Evaluation Note  [name] , a 76 y.o. female  was evaluated in triage.  Pt complains of chest pain.  Pain is located throughout entire chest.  Pain has been constant over the last week.  Patient states that pain is worse with touch.  Denies any other aggravating factors.  Patient denies any shortness of breath, hemoptysis, leg swelling or tenderness, diaphoresis, nausea, vomiting, palpitations.  Additionally patient complains of a headache.  Review of Systems  Positive: Chest pain, headache Negative: Shortness of breath, hemoptysis, leg swelling or tenderness, diaphoresis, nausea, vomiting  Physical Exam  BP (!) 144/66 (BP Location: Left Arm)    Pulse 75    Temp 97.9 F (36.6 C)    Resp 18    SpO2 98%  Gen:   Awake, no distress   Resp:  Normal effort, lungs clear to auscultation bilaterally MSK:   Moves extremities without difficulty, no swelling or tenderness Other:  Tenderness to chest wall  Medical Decision Making  Medically screening exam initiated at 9:52 PM.  Appropriate orders placed.  [name] was informed that the remainder of the evaluation will be completed by another provider, this initial triage assessment does not replace that evaluation, and the importance of remaining in the ED until their evaluation is complete.     [name], [place] 01/30/21 2153

## 2021-01-31 ENCOUNTER — Encounter (HOSPITAL_COMMUNITY): Payer: Self-pay

## 2021-01-31 ENCOUNTER — Emergency Department (HOSPITAL_COMMUNITY)
Admission: EM | Admit: 2021-01-31 | Discharge: 2021-02-01 | Disposition: A | Discharge status: Home / Self Care | Payer: Medicare Other | Source: Home / Self Care | Attending: Emergency Medicine | Admitting: Emergency Medicine

## 2021-01-31 ENCOUNTER — Emergency Department (HOSPITAL_COMMUNITY): Payer: Medicare Other

## 2021-01-31 ENCOUNTER — Other Ambulatory Visit: Payer: Self-pay

## 2021-01-31 DIAGNOSIS — Z7902 Long term (current) use of antithrombotics/antiplatelets: Secondary | ICD-10-CM | POA: Insufficient documentation

## 2021-01-31 DIAGNOSIS — Z7984 Long term (current) use of oral hypoglycemic drugs: Secondary | ICD-10-CM | POA: Insufficient documentation

## 2021-01-31 DIAGNOSIS — I10 Essential (primary) hypertension: Secondary | ICD-10-CM | POA: Insufficient documentation

## 2021-01-31 DIAGNOSIS — R079 Chest pain, unspecified: Secondary | ICD-10-CM | POA: Insufficient documentation

## 2021-01-31 DIAGNOSIS — Z79899 Other long term (current) drug therapy: Secondary | ICD-10-CM | POA: Insufficient documentation

## 2021-01-31 DIAGNOSIS — E119 Type 2 diabetes mellitus without complications: Secondary | ICD-10-CM | POA: Insufficient documentation

## 2021-01-31 DIAGNOSIS — D649 Anemia, unspecified: Secondary | ICD-10-CM

## 2021-01-31 DIAGNOSIS — Z7982 Long term (current) use of aspirin: Secondary | ICD-10-CM | POA: Insufficient documentation

## 2021-01-31 DIAGNOSIS — N289 Disorder of kidney and ureter, unspecified: Secondary | ICD-10-CM

## 2021-01-31 DIAGNOSIS — R519 Headache, unspecified: Secondary | ICD-10-CM

## 2021-01-31 LAB — TROPONIN I (HIGH SENSITIVITY): Troponin I (High Sensitivity): 8 ng/L (ref <18)

## 2021-01-31 NOTE — ED Notes (Signed)
Pt refused to take bra or multiple layers of clothes off for ekg. I explained the importance of removing these items for accuracy of EKG. Pt still refused and stated " they didn't make me do all this at [facility] hospital." Triage RN and PA made aware

## 2021-01-31 NOTE — ED Notes (Signed)
Patient decided to leave.   

## 2021-01-31 NOTE — ED Provider Triage Note (Addendum)
Emergency Medicine Provider Triage Evaluation Note  [name] , a 76 y.o. female  was evaluated in triage.  Pt complains of bad headache, chest pain, legs, feet and hand pain. Used to go to the [place] [facility] now no PCP. Frequently comes for the same complaint. Labs/ imaging within the last 24 hours.  Review of Systems  Positive: Cp/headache Negative: fever  Physical Exam  BP (!) 169/56 (BP Location: Left Wrist)    Pulse 82    Temp 97.9 F (36.6 C) (Oral)    Resp 13    Ht 5\' 5"  (1.651 m)    Wt 63.5 kg    SpO2 95%    BMI 23.30 kg/m  Gen:   Awake, no distress   Resp:  Normal effort  MSK:   Moves extremities without difficulty  Other:  RRR  Medical Decision Making  Medically screening exam initiated at 7:44 PM.  Appropriate orders placed.  [name] was informed that the remainder of the evaluation will be completed by another provider, this initial triage assessment does not replace that evaluation, and the importance of remaining in the ED until their evaluation is complete.   Work up [name], Adriana Doce, PA-C 01/31/21 [place], [place], PA-C 01/31/21 1950

## 2021-01-31 NOTE — ED Triage Notes (Signed)
Pt reports with cough, chest pain, and headache x 1 week.

## 2021-02-01 ENCOUNTER — Other Ambulatory Visit: Payer: Self-pay

## 2021-02-01 ENCOUNTER — Encounter (HOSPITAL_COMMUNITY): Payer: Self-pay | Admitting: Emergency Medicine

## 2021-02-01 ENCOUNTER — Emergency Department (HOSPITAL_COMMUNITY)
Admission: EM | Admit: 2021-02-01 | Discharge: 2021-02-02 | Disposition: A | Discharge status: Home / Self Care | Payer: Medicare Other | Attending: Emergency Medicine | Admitting: Emergency Medicine

## 2021-02-01 DIAGNOSIS — Z5321 Procedure and treatment not carried out due to patient leaving prior to being seen by health care provider: Secondary | ICD-10-CM | POA: Insufficient documentation

## 2021-02-01 DIAGNOSIS — R519 Headache, unspecified: Secondary | ICD-10-CM | POA: Insufficient documentation

## 2021-02-01 DIAGNOSIS — R079 Chest pain, unspecified: Secondary | ICD-10-CM | POA: Insufficient documentation

## 2021-02-01 LAB — BASIC METABOLIC PANEL
Anion gap: 12 (ref 5–15)
BUN: 26 mg/dL — ABNORMAL HIGH (ref 8–23)
CO2: 26 mmol/L (ref 22–32)
Calcium: 8.8 mg/dL — ABNORMAL LOW (ref 8.9–10.3)
Chloride: 96 mmol/L — ABNORMAL LOW (ref 98–111)
Creatinine, Ser: 1.31 mg/dL — ABNORMAL HIGH (ref 0.44–1.00)
GFR, Estimated: 43 mL/min — ABNORMAL LOW (ref >60)
Glucose, Bld: 109 mg/dL — ABNORMAL HIGH (ref 70–99)
Potassium: 3.7 mmol/L (ref 3.5–5.1)
Sodium: 134 mmol/L — ABNORMAL LOW (ref 135–145)

## 2021-02-01 LAB — CBC WITH DIFFERENTIAL/PLATELET
Abs Immature Granulocytes: 0.03 10*3/uL (ref 0.00–0.07)
Abs Immature Granulocytes: 0.02 10*3/uL (ref 0.00–0.07)
Basophils Absolute: 0.1 10*3/uL (ref 0.0–0.1)
Basophils Absolute: 0 10*3/uL (ref 0.0–0.1)
Basophils Relative: 1 %
Basophils Relative: 1 %
Eosinophils Absolute: 0.1 10*3/uL (ref 0.0–0.5)
Eosinophils Absolute: 0.1 10*3/uL (ref 0.0–0.5)
Eosinophils Relative: 1 %
Eosinophils Relative: 1 %
HCT: 33.5 % — ABNORMAL LOW (ref 36.0–46.0)
HCT: 33.5 % — ABNORMAL LOW (ref 36.0–46.0)
Hemoglobin: 10.7 g/dL — ABNORMAL LOW (ref 12.0–15.0)
Hemoglobin: 10.6 g/dL — ABNORMAL LOW (ref 12.0–15.0)
Immature Granulocytes: 0 %
Immature Granulocytes: 0 %
Lymphocytes Relative: 18 %
Lymphocytes Relative: 21 %
Lymphs Abs: 1.4 10*3/uL (ref 0.7–4.0)
Lymphs Abs: 1.8 10*3/uL (ref 0.7–4.0)
MCH: 29.1 pg (ref 26.0–34.0)
MCH: 28.6 pg (ref 26.0–34.0)
MCHC: 31.9 g/dL (ref 30.0–36.0)
MCHC: 31.6 g/dL (ref 30.0–36.0)
MCV: 91 fL (ref 80.0–100.0)
MCV: 90.5 fL (ref 80.0–100.0)
Monocytes Absolute: 0.6 10*3/uL (ref 0.1–1.0)
Monocytes Absolute: 0.6 10*3/uL (ref 0.1–1.0)
Monocytes Relative: 7 %
Monocytes Relative: 7 %
Neutro Abs: 5.7 10*3/uL (ref 1.7–7.7)
Neutro Abs: 5.9 10*3/uL (ref 1.7–7.7)
Neutrophils Relative %: 73 %
Neutrophils Relative %: 70 %
Platelets: 244 10*3/uL (ref 150–400)
Platelets: 259 10*3/uL (ref 150–400)
RBC: 3.68 MIL/uL — ABNORMAL LOW (ref 3.87–5.11)
RBC: 3.7 MIL/uL — ABNORMAL LOW (ref 3.87–5.11)
RDW: 13.2 % (ref 11.5–15.5)
RDW: 13.2 % (ref 11.5–15.5)
WBC: 7.9 10*3/uL (ref 4.0–10.5)
WBC: 8.4 10*3/uL (ref 4.0–10.5)
nRBC: 0 % (ref 0.0–0.2)
nRBC: 0 % (ref 0.0–0.2)

## 2021-02-01 LAB — COMPREHENSIVE METABOLIC PANEL
ALT: 12 U/L (ref 0–44)
AST: 19 U/L (ref 15–41)
Albumin: 3.9 g/dL (ref 3.5–5.0)
Alkaline Phosphatase: 69 U/L (ref 38–126)
Anion gap: 9 (ref 5–15)
BUN: 28 mg/dL — ABNORMAL HIGH (ref 8–23)
CO2: 27 mmol/L (ref 22–32)
Calcium: 8.7 mg/dL — ABNORMAL LOW (ref 8.9–10.3)
Chloride: 99 mmol/L (ref 98–111)
Creatinine, Ser: 1.3 mg/dL — ABNORMAL HIGH (ref 0.44–1.00)
GFR, Estimated: 43 mL/min — ABNORMAL LOW (ref >60)
Glucose, Bld: 107 mg/dL — ABNORMAL HIGH (ref 70–99)
Potassium: 3.6 mmol/L (ref 3.5–5.1)
Sodium: 135 mmol/L (ref 135–145)
Total Bilirubin: 0.7 mg/dL (ref 0.3–1.2)
Total Protein: 7.9 g/dL (ref 6.5–8.1)

## 2021-02-01 LAB — TROPONIN I (HIGH SENSITIVITY)
Troponin I (High Sensitivity): 5 ng/L (ref <18)
Troponin I (High Sensitivity): 6 ng/L (ref <18)

## 2021-02-01 MED ORDER — ACETAMINOPHEN 325 MG PO TABS
650.0000 mg | ORAL_TABLET | Freq: Once | ORAL | Status: AC
  Administered 2021-02-01: 650 mg via ORAL
  Filled 2021-02-01: qty 2

## 2021-02-01 NOTE — ED Provider Triage Note (Signed)
Emergency Medicine Provider Triage Evaluation Note  [name] , a 76 y.o. female  was evaluated in triage.  Pt complains of headache x1 week.  Patient has associated left-sided chest pain.  Has tried Tylenol with no relief for her symptoms.  Patient denies shortness of breath, fever, chills, nausea, vomiting, abdominal pain.  Review of Systems  Positive: As per HPI above. Negative: Fever, chills  Physical Exam  BP (!) 170/64 (BP Location: Left Arm)    Pulse 76    Temp 97.8 F (36.6 C) (Oral)    Resp 19    Ht 5\' 5"  (1.651 m)    SpO2 100%    BMI 23.30 kg/m  Gen:   Awake, no distress   Resp:  Normal effort  MSK:   Moves extremities without difficulty Other:  Negative pronator drift.  PERRL.  EOMI.  Medical Decision Making  Medically screening exam initiated at 9:23 PM.  Appropriate orders placed.  [name] was informed that the remainder of the evaluation will be completed by another provider, this initial triage assessment does not replace that evaluation, and the importance of remaining in the ED until their evaluation is complete.    Teyon Odette A, PA-C 02/01/21 2126

## 2021-02-01 NOTE — ED Notes (Signed)
Patient refused EKG. Requested female nurse. None available at this time.

## 2021-02-01 NOTE — Discharge Instructions (Signed)
Your blood test today shows that your kidneys are working better than they were yesterday.  This will still need to be followed to make sure that they return to their normal function.  Ideally, blood test should be repeated in about 1 week.  Please follow-up with your primary care provider, and also see your eye doctor to make sure that you are getting appropriate glasses.  Return if you are having any problems.

## 2021-02-01 NOTE — ED Triage Notes (Addendum)
Pt reports chest pain and headache X1 week. Pt refused EKG

## 2021-02-01 NOTE — ED Notes (Signed)
Patient refused the COVID and flu swab.

## 2021-02-01 NOTE — ED Notes (Addendum)
Pt refused COVID/flu swab from the nurse 5 hours ago and from me just now.

## 2021-02-01 NOTE — ED Provider Notes (Signed)
[facility] DEPT Provider Note   CSN: [id number] Arrival date & time: 01/31/21  1924     History  Chief Complaint  Patient presents with   Chest Pain   Headache   Cough    [name] is a 76 y.o. female.  The history is provided by the patient.  Chest Pain Associated symptoms: cough and headache   Headache Associated symptoms: cough   Cough Associated symptoms: chest pain and headaches   She has history of hypertension, diabetes, stroke, non-STEMI with cardiac catheterization showing normal coronary arteries and comes in complaining of pain in her head, chest, legs and feet.  Pain is chronic and has been present for a long time.  She complains that the medications she is given do not help.  She cannot tell me how anything is different tonight.  She denies dyspnea, nausea, diaphoresis.  She denies fever or chills.  She is complaining of blurred vision and states that she needs to see an eye doctor to get a good pair of glasses and she needs to see someone to get a prescription for good pair of shoes.   Home Medications Prior to Admission medications   Medication Sig Start Date End Date Taking? Authorizing Provider  chlorthalidone (HYGROTON) 25 MG tablet Take 1 tablet (25 mg total) by mouth daily. 10/05/20 10/05/21 Yes King, Diona Foley, NP  diphenhydramine-acetaminophen (TYLENOL PM) 25-500 MG TABS tablet Take 0.5 tablets by mouth at bedtime as needed. 1/2 tablet   Yes [provider]  gabapentin (NEURONTIN) 300 MG capsule Take 1 capsule (300 mg total) by mouth 3 (three) times daily. Patient taking differently: Take 300 mg by mouth daily. 10/05/20  Yes Vevelyn Francois, NP  hydrALAZINE (APRESOLINE) 50 MG tablet Take 1 tablet (50 mg total) by mouth 2 (two) times daily. Patient taking differently: Take 50 mg by mouth daily. 04/22/20  Yes Vevelyn Francois, NP  losartan (COZAAR) 50 MG tablet Take 1 tablet (50 mg total) by mouth daily. 10/05/20 10/05/21 Yes  Vevelyn Francois, NP  metFORMIN (GLUCOPHAGE) 500 MG tablet Take 2 tablets (1,000 mg total) by mouth 2 (two) times daily with a meal. Patient taking differently: Take 1,000 mg by mouth daily with breakfast. 10/05/20 10/05/21 Yes Vevelyn Francois, NP  rosuvastatin (CRESTOR) 10 MG tablet Take 1 tablet (10 mg total) by mouth at bedtime. 10/05/20 10/05/21 Yes Vevelyn Francois, NP  albuterol (VENTOLIN HFA) 108 (90 Base) MCG/ACT inhaler Inhale 1-2 puffs into the lungs every 6 (six) hours as needed for wheezing or shortness of breath. 04/22/20   Vevelyn Francois, NP  aspirin EC 81 MG tablet Take 81 mg by mouth daily. Swallow whole.    [provider]  clopidogrel (PLAVIX) 75 MG tablet Take 1 tablet (75 mg total) by mouth daily. 04/22/20   Vevelyn Francois, NP  diclofenac Sodium (VOLTAREN) 1 % GEL Apply 2 g topically 4 (four) times daily as needed. 07/27/20   Quintella Reichert, MD  gatifloxacin (ZYMAXID) 0.5 % SOLN Place 1 drop into the right eye 4 (four) times daily. 06/28/20   [provider]  glipiZIDE (GLUCOTROL) 5 MG tablet Take 1 tablet (5 mg total) by mouth 2 (two) times daily before a meal. 04/22/20 04/22/21  Vevelyn Francois, NP  ketorolac (ACULAR) 0.5 % ophthalmic solution Place 1 drop into the right eye 4 (four) times daily. Patient not taking: Reported on 02/01/2021 06/28/20   [provider]  lidocaine (LIDODERM) 5 %  Place 1 patch onto the skin daily. Remove & Discard patch within 12 hours or as directed by MD 01/10/21   Margarita Mail, PA-C  mirabegron ER (MYRBETRIQ) 25 MG TB24 tablet Take 25 mg by mouth daily.    [provider]  [facility] VERIO test strip Use 1-4 times daily as directed/needed   DX E11.9 Patient not taking: No sig reported 04/25/20   Vevelyn Francois, NP  potassium chloride (KLOR-CON) 10 MEQ tablet Take 10 mEq by mouth daily.    [provider]  prednisoLONE acetate (PRED FORTE) 1 % ophthalmic suspension Place 1 drop into the right eye 4 (four) times  daily. Patient not taking: Reported on 02/01/2021 06/28/20   [provider]      Allergies    Codeine, Ibuprofen, and Imdur [isosorbide nitrate]    Review of Systems   Review of Systems  Respiratory:  Positive for cough.   Cardiovascular:  Positive for chest pain.  Neurological:  Positive for headaches.  All other systems reviewed and are negative.  Physical Exam Updated Vital Signs BP 138/61 (BP Location: Left Arm)    Pulse 74    Temp 97.9 F (36.6 C) (Oral)    Resp 16    Ht 5\' 5"  (1.651 m)    Wt 63.5 kg    SpO2 99%    BMI 23.30 kg/m  Physical Exam Vitals and nursing note reviewed.  76 year old female, resting comfortably and in no acute distress. Vital signs are normal. Oxygen saturation is 99%, which is normal. Head is normocephalic and atraumatic. PERRLA, EOMI. Oropharynx is clear. Neck is nontender and supple without adenopathy or JVD. Back is nontender and there is no CVA tenderness. Lungs are clear without rales, wheezes, or rhonchi. Chest is mildly tender diffusely.  There is no crepitus. Heart has regular rate and rhythm without murmur. Abdomen is soft, flat, nontender. Extremities have no cyanosis or edema, full range of motion is present. Skin is warm and dry without rash. Neurologic: Mental status is normal, cranial nerves are intact, moves all extremities equally.  ED Results / Procedures / Treatments   Labs (all labs ordered are listed, but only abnormal results are displayed) Labs Reviewed  COMPREHENSIVE METABOLIC PANEL - Abnormal; Notable for the following components:      Result Value   Glucose, Bld 107 (*)    BUN 28 (*)    Creatinine, Ser 1.30 (*)    Calcium 8.7 (*)    GFR, Estimated 43 (*)    All other components within normal limits  CBC WITH DIFFERENTIAL/PLATELET - Abnormal; Notable for the following components:   RBC 3.68 (*)    Hemoglobin 10.7 (*)    HCT 33.5 (*)    All other components within normal limits  RESP PANEL BY RT-PCR (FLU  A&B, COVID) ARPGX2  TROPONIN I (HIGH SENSITIVITY)  TROPONIN I (HIGH SENSITIVITY)    EKG EKG Interpretation  Date/Time:  Tuesday January 31 2021 19:40:34 EST Ventricular Rate:  82 PR Interval:  123 QRS Duration: 72 QT Interval:  362 QTC Calculation: 423 R Axis:   34 Text Interpretation: Sinus rhythm Nonspecific T wave abnormality When compared with ECG of 01/30/2021, No significant change was found Confirmed by Delora Fuel ([id number]) on 02/01/2021 2:19:47 AM  Radiology DG Chest 2 View  Result Date: 01/30/2021 CLINICAL DATA:  Chest pain EXAM: CHEST - 2 VIEW COMPARISON:  12/21/2020 FINDINGS: The heart size and mediastinal contours are within normal limits. Both lungs are clear.  The visualized skeletal structures are unremarkable. IMPRESSION: Negative. Electronically Signed   By: Rolm Baptise M.D.   On: 01/30/2021 22:21    Procedures Procedures    Medications Ordered in ED Medications  acetaminophen (TYLENOL) tablet 650 mg (has no administration in time range)    ED Course/ Medical Decision Making/ A&P                           Medical Decision Making  Multiple somatic complaints with benign exam.  Old records are reviewed showing numerous ED visits with similar complaints.  She had been in the emergency department at [facility] yesterday but left without being seen.  At that time, troponin was normal, but creatinine had gone up from a baseline of 1.07 and was at 1.47.  This will need to be repeated.  Hemoglobin had also fallen from 12.3 on 12/13 to 10.4 yesterday.  This will need to be repeated.  Further review of past records shows hemoglobin has been going up and down and had been in similar range before.  She had a non-STEMI in 2015 at which time cardiac catheterization showed normal coronary arteries.  She apparently is not taking any medication for pain, will give a dose of acetaminophen.  She is intolerant of NSAIDs.  Following above-noted treatment, patient is sleeping  comfortably.  Chest x-ray shows no acute process.  I have independently viewed the images and agree with the radiologist interpretation.  Labs show normal troponin, stable anemia.  Creatinine has improved compared with yesterday, but it is not back to baseline.  She is felt to be safe for discharge.  Advised to continue taking over-the-counter acetaminophen as needed for pain, follow-up with her physicians for management of her chronic problems.  Recommended repeat metabolic panel in 1 week.        Final Clinical Impression(s) / ED Diagnoses Final diagnoses:  Nonspecific chest pain  Nonintractable headache, unspecified chronicity pattern, unspecified headache type  Renal insufficiency  Normochromic normocytic anemia    Rx / DC Orders ED Discharge Orders     None         Delora Fuel, MD [id number] [phone]

## 2021-02-01 NOTE — ED Notes (Signed)
MD made aware that patient refused her COVID swab

## 2021-02-02 ENCOUNTER — Emergency Department (HOSPITAL_COMMUNITY)
Admission: EM | Admit: 2021-02-02 | Discharge: 2021-02-03 | Disposition: A | Discharge status: Home / Self Care | Payer: Medicare Other | Source: Home / Self Care

## 2021-02-02 ENCOUNTER — Emergency Department (HOSPITAL_COMMUNITY): Payer: Medicare Other

## 2021-02-02 ENCOUNTER — Other Ambulatory Visit: Payer: Self-pay

## 2021-02-02 DIAGNOSIS — Z5321 Procedure and treatment not carried out due to patient leaving prior to being seen by health care provider: Secondary | ICD-10-CM | POA: Insufficient documentation

## 2021-02-02 DIAGNOSIS — R519 Headache, unspecified: Secondary | ICD-10-CM | POA: Insufficient documentation

## 2021-02-02 DIAGNOSIS — R079 Chest pain, unspecified: Secondary | ICD-10-CM | POA: Diagnosis not present

## 2021-02-02 LAB — BASIC METABOLIC PANEL
Anion gap: 11 (ref 5–15)
BUN: 24 mg/dL — ABNORMAL HIGH (ref 8–23)
CO2: 28 mmol/L (ref 22–32)
Calcium: 9 mg/dL (ref 8.9–10.3)
Chloride: 96 mmol/L — ABNORMAL LOW (ref 98–111)
Creatinine, Ser: 1.35 mg/dL — ABNORMAL HIGH (ref 0.44–1.00)
GFR, Estimated: 41 mL/min — ABNORMAL LOW (ref >60)
Glucose, Bld: 148 mg/dL — ABNORMAL HIGH (ref 70–99)
Potassium: 3.4 mmol/L — ABNORMAL LOW (ref 3.5–5.1)
Sodium: 135 mmol/L (ref 135–145)

## 2021-02-02 LAB — CBC
HCT: 32.5 % — ABNORMAL LOW (ref 36.0–46.0)
Hemoglobin: 10.8 g/dL — ABNORMAL LOW (ref 12.0–15.0)
MCH: 29.8 pg (ref 26.0–34.0)
MCHC: 33.2 g/dL (ref 30.0–36.0)
MCV: 89.8 fL (ref 80.0–100.0)
Platelets: 238 10*3/uL (ref 150–400)
RBC: 3.62 MIL/uL — ABNORMAL LOW (ref 3.87–5.11)
RDW: 13.1 % (ref 11.5–15.5)
WBC: 7.6 10*3/uL (ref 4.0–10.5)
nRBC: 0 % (ref 0.0–0.2)

## 2021-02-02 LAB — TROPONIN I (HIGH SENSITIVITY)
Troponin I (High Sensitivity): 6 ng/L (ref <18)
Troponin I (High Sensitivity): 6 ng/L (ref <18)

## 2021-02-02 NOTE — ED Triage Notes (Signed)
Pt brought to ED triage via wheelchair with c/o CP to center of chest and headache. Was given 324mg  ASA and Nitro 0.4 SL en route to facility per EMS. Pt initially uncooperative during triage and does not volunteer information and seems to be annoyed when general triage questions are asked.

## 2021-02-02 NOTE — ED Notes (Signed)
Pt left emergency department  

## 2021-02-02 NOTE — ED Provider Triage Note (Signed)
Emergency Medicine Provider Triage Evaluation Note  [name] , a 76 y.o. female  was evaluated in triage.  Pt complains of chest pain.  Pain has been ongoing for a week.  Pain is in the middle of her chest.  Does not radiate.  Is constant.  No shortness of breath, nausea, vomiting, vision changes, numbness, weakness.  Patient is seen in the ED often with similar symptoms.  Review of Systems  Positive:  Negative:   Physical Exam  BP (!) 154/64    Pulse 89    Temp 98.4 F (36.9 C)    Resp 17    SpO2 98%  Gen:   Awake, no distress   Resp:  Normal effort  MSK:   Moves extremities without difficulty  Other:  Reproducible TTP of anterior chest wall  Medical Decision Making  Medically screening exam initiated at 7:42 PM.  Appropriate orders placed.  Whisper Kurka was informed that the remainder of the evaluation will be completed by another provider, this initial triage assessment does not replace that evaluation, and the importance of remaining in the ED until their evaluation is complete.  Cp workup   Claudie Leach, PA-C 02/02/21 1942

## 2021-02-03 LAB — TROPONIN I (HIGH SENSITIVITY): Troponin I (High Sensitivity): 6 ng/L (ref <18)

## 2021-02-03 NOTE — ED Notes (Signed)
EMS IV removed by this NT 

## 2021-02-03 NOTE — ED Notes (Signed)
Pt being escorted to bus stop by security. Pt wanting to leave.

## 2021-02-05 ENCOUNTER — Other Ambulatory Visit: Payer: Self-pay

## 2021-02-05 ENCOUNTER — Emergency Department (HOSPITAL_COMMUNITY): Payer: Medicare Other

## 2021-02-05 ENCOUNTER — Emergency Department (HOSPITAL_COMMUNITY)
Admission: EM | Admit: 2021-02-05 | Discharge: 2021-02-05 | Disposition: A | Discharge status: Home / Self Care | Payer: Medicare Other | Attending: Emergency Medicine | Admitting: Emergency Medicine

## 2021-02-05 DIAGNOSIS — Z79899 Other long term (current) drug therapy: Secondary | ICD-10-CM | POA: Insufficient documentation

## 2021-02-05 DIAGNOSIS — Z7982 Long term (current) use of aspirin: Secondary | ICD-10-CM | POA: Insufficient documentation

## 2021-02-05 DIAGNOSIS — M25512 Pain in left shoulder: Secondary | ICD-10-CM | POA: Diagnosis not present

## 2021-02-05 DIAGNOSIS — I10 Essential (primary) hypertension: Secondary | ICD-10-CM | POA: Diagnosis not present

## 2021-02-05 DIAGNOSIS — Z7902 Long term (current) use of antithrombotics/antiplatelets: Secondary | ICD-10-CM | POA: Insufficient documentation

## 2021-02-05 DIAGNOSIS — M25552 Pain in left hip: Secondary | ICD-10-CM | POA: Insufficient documentation

## 2021-02-05 DIAGNOSIS — Z7984 Long term (current) use of oral hypoglycemic drugs: Secondary | ICD-10-CM | POA: Insufficient documentation

## 2021-02-05 DIAGNOSIS — E119 Type 2 diabetes mellitus without complications: Secondary | ICD-10-CM | POA: Diagnosis not present

## 2021-02-05 MED ORDER — NAPROXEN 250 MG PO TABS: 250.0000 mg | ORAL_TABLET | Freq: Two times a day (BID) | ORAL | 0 refills | Status: DC

## 2021-02-05 MED ORDER — HYDROCODONE-ACETAMINOPHEN 5-325 MG PO TABS
1.0000 | ORAL_TABLET | Freq: Once | ORAL | Status: AC
  Administered 2021-02-05: 1 via ORAL
  Filled 2021-02-05: qty 1

## 2021-02-05 NOTE — Discharge Instructions (Signed)
Take the medications as needed for pain.  Follow-up with your doctor to be rechecked. 

## 2021-02-05 NOTE — ED Triage Notes (Addendum)
Patient BIBA from home. C/O L hip pain since yesterday. Pain shoots down leg, some tingling in foot noted. Patient fell on 02/02/21 getting off bus, and think this might be why it hurts.  EMS vitals: HR:88 SPO2: 97 RA CBG: 129

## 2021-02-05 NOTE — ED Provider Notes (Signed)
[facility] DEPT Provider Note   CSN: [id number] Arrival date & time: 02/05/21  1609     History  Chief Complaint  Patient presents with   Hip Pain    L hip pain    [name] is a 76 y.o. female.   Hip Pain   Patient has a history of diabetes, hypertension, stroke.  Patient also frequently visits to the emergency room for various complaints.  Patient has been seen 33 times in the last 6 months in the emergency department.  Patient initially told nurses that she was having pain in her hip.  When I first walked in the room patient started to say that she has been having pain in her left shoulder, but then the patient said "No,  that's right, I am here because of pain in my hip."  Patient states she fell a couple days ago when she was stepping off the bus.  Patient since then has been having pain in her hip area and it goes down to her toes.  She is also having some pain in her shoulder.  She denies any loss of consciousness.  No nausea or vomiting.  No headache or loss of consciousness.  No fevers or chills.  No chest pain or shortness of breath.  No abdominal pain. Patient was in by EMS.  They checked her blood sugar and it was 129 Home Medications Prior to Admission medications   Medication Sig Start Date End Date Taking? Authorizing Provider  naproxen (NAPROSYN) 250 MG tablet Take 1 tablet (250 mg total) by mouth 2 (two) times daily with a meal. 02/05/21  Yes Dorie Rank, MD  albuterol (VENTOLIN HFA) 108 (90 Base) MCG/ACT inhaler Inhale 1-2 puffs into the lungs every 6 (six) hours as needed for wheezing or shortness of breath. 04/22/20   Vevelyn Francois, NP  aspirin EC 81 MG tablet Take 81 mg by mouth daily. Swallow whole.    [provider]  chlorthalidone (HYGROTON) 25 MG tablet Take 1 tablet (25 mg total) by mouth daily. 10/05/20 10/05/21  Vevelyn Francois, NP  clopidogrel (PLAVIX) 75 MG tablet Take 1 tablet (75 mg total) by mouth daily. 04/22/20    Vevelyn Francois, NP  diclofenac Sodium (VOLTAREN) 1 % GEL Apply 2 g topically 4 (four) times daily as needed. 07/27/20   Quintella Reichert, MD  diphenhydramine-acetaminophen (TYLENOL PM) 25-500 MG TABS tablet Take 0.5 tablets by mouth at bedtime as needed. 1/2 tablet    [provider]  gabapentin (NEURONTIN) 300 MG capsule Take 1 capsule (300 mg total) by mouth 3 (three) times daily. Patient taking differently: Take 300 mg by mouth daily. 10/05/20   Vevelyn Francois, NP  gatifloxacin (ZYMAXID) 0.5 % SOLN Place 1 drop into the right eye 4 (four) times daily. 06/28/20   [provider]  glipiZIDE (GLUCOTROL) 5 MG tablet Take 1 tablet (5 mg total) by mouth 2 (two) times daily before a meal. 04/22/20 04/22/21  Vevelyn Francois, NP  hydrALAZINE (APRESOLINE) 50 MG tablet Take 1 tablet (50 mg total) by mouth 2 (two) times daily. Patient taking differently: Take 50 mg by mouth daily. 04/22/20   Vevelyn Francois, NP  ketorolac (ACULAR) 0.5 % ophthalmic solution Place 1 drop into the right eye 4 (four) times daily. Patient not taking: Reported on 02/01/2021 06/28/20   [provider]  lidocaine (LIDODERM) 5 % Place 1 patch onto the skin daily. Remove & Discard patch within 12 hours  or as directed by MD 01/10/21   Margarita Mail, PA-C  losartan (COZAAR) 50 MG tablet Take 1 tablet (50 mg total) by mouth daily. 10/05/20 10/05/21  Vevelyn Francois, NP  metFORMIN (GLUCOPHAGE) 500 MG tablet Take 2 tablets (1,000 mg total) by mouth 2 (two) times daily with a meal. Patient taking differently: Take 1,000 mg by mouth daily with breakfast. 10/05/20 10/05/21  Vevelyn Francois, NP  mirabegron ER (MYRBETRIQ) 25 MG TB24 tablet Take 25 mg by mouth daily.    [provider]  [facility] VERIO test strip Use 1-4 times daily as directed/needed   DX E11.9 Patient not taking: No sig reported 04/25/20   Vevelyn Francois, NP  potassium chloride (KLOR-CON) 10 MEQ tablet Take 10 mEq by mouth daily.    [provider]   prednisoLONE acetate (PRED FORTE) 1 % ophthalmic suspension Place 1 drop into the right eye 4 (four) times daily. Patient not taking: Reported on 02/01/2021 06/28/20   [provider]  rosuvastatin (CRESTOR) 10 MG tablet Take 1 tablet (10 mg total) by mouth at bedtime. 10/05/20 10/05/21  Vevelyn Francois, NP      Allergies    Codeine, Ibuprofen, and Imdur [isosorbide nitrate]    Review of Systems   Review of Systems  Constitutional:  Negative for fever.   Physical Exam Updated Vital Signs BP (!) 175/67    Pulse 66    Temp 97.6 F (36.4 C) (Oral)    Resp (!) 22    SpO2 98%  Physical Exam Vitals and nursing note reviewed.  Constitutional:      General: She is not in acute distress.    Appearance: She is well-developed.  HENT:     Head: Normocephalic and atraumatic.     Right Ear: External ear normal.     Left Ear: External ear normal.  Eyes:     General: No scleral icterus.       Right eye: No discharge.        Left eye: No discharge.     Conjunctiva/sclera: Conjunctivae normal.  Neck:     Trachea: No tracheal deviation.  Cardiovascular:     Rate and Rhythm: Normal rate and regular rhythm.  Pulmonary:     Effort: Pulmonary effort is normal. No respiratory distress.     Breath sounds: Normal breath sounds. No stridor. No wheezing or rales.  Abdominal:     General: Bowel sounds are normal. There is no distension.     Palpations: Abdomen is soft.     Tenderness: There is no abdominal tenderness. There is no guarding or rebound.  Musculoskeletal:        General: No tenderness or deformity.     Cervical back: Normal and neck supple.     Thoracic back: Normal.     Lumbar back: No bony tenderness.     Right hip: Normal.     Left hip: Normal.     Right knee: Normal.     Left knee: Normal.     Right lower leg: Normal.     Left lower leg: Normal.     Right foot: Normal.     Left foot: Normal.     Comments: Tenderness palpation left buttock region  Skin:    General:  Skin is warm and dry.     Findings: No rash.  Neurological:     General: No focal deficit present.     Mental Status: She is alert.  Cranial Nerves: No cranial nerve deficit (no facial droop, extraocular movements intact, no slurred speech).     Sensory: No sensory deficit.     Motor: No abnormal muscle tone or seizure activity.     Coordination: Coordination normal.  Psychiatric:        Mood and Affect: Mood normal.    ED Results / Procedures / Treatments   Labs (all labs ordered are listed, but only abnormal results are displayed) Labs Reviewed - No data to display  EKG None  Radiology DG Lumbar Spine Complete  Result Date: 02/05/2021 CLINICAL DATA:  Left posterior hip pain, left lower extremity radiculopathy, fell EXAM: LUMBAR SPINE - COMPLETE 4+ VIEW COMPARISON:  None. FINDINGS: Frontal, bilateral oblique, lateral views of the lumbar spine are obtained. There are 5 non-rib-bearing lumbar type vertebral bodies identified, with minimal grade 1 anterolisthesis of L4 relative to L5. There is diffuse multilevel facet hypertrophy, with moderate spondylosis greatest at L4-5 and L5-S1. No acute fracture. Sacroiliac joints are unremarkable. IMPRESSION: 1. Mild anterolisthesis of L4 on L5, likely due to degenerative change. 2. Extensive multilevel lumbar facet hypertrophy and lower lumbar spondylosis. No acute fracture. Electronically Signed   By: Randa Ngo M.D.   On: 02/05/2021 18:52   DG Shoulder Left  Result Date: 02/05/2021 CLINICAL DATA:  [place], pain EXAM: LEFT SHOULDER - 2+ VIEW COMPARISON:  11/02/2020 FINDINGS: Frontal, axillary, and transscapular views of the left shoulder are obtained on 4 images. No acute fracture, subluxation, or dislocation. Mild spurring of the acromion process. Visualized portions of the left chest are clear. IMPRESSION: 1. No acute displaced fracture. 2. Stable degenerative change. Electronically Signed   By: Randa Ngo M.D.   On: 02/05/2021 18:51    DG Hip Unilat With Pelvis 2-3 Views Left  Result Date: 02/05/2021 CLINICAL DATA:  [place], posterior left hip pain radiating down left leg EXAM: DG HIP (WITH OR WITHOUT PELVIS) 2-3V LEFT COMPARISON:  10/20/2020 FINDINGS: Frontal view of the pelvis as well as frontal and frogleg lateral views of the left hip are obtained. No acute fracture, subluxation, or dislocation within either hip. Symmetrical bilateral hip osteoarthritis is unchanged. Sacroiliac joints are normal. Remainder of the bony pelvis is unremarkable. IMPRESSION: 1. Stable bilateral hip osteoarthritis. No acute displaced fracture. Electronically Signed   By: Randa Ngo M.D.   On: 02/05/2021 18:45    Procedures Procedures    Medications Ordered in ED Medications  HYDROcodone-acetaminophen (NORCO/VICODIN) 5-325 MG per tablet 1 tablet (1 tablet Oral Given 02/05/21 1935)    ED Course/ Medical Decision Making/ A&P Clinical Course as of 02/05/21 1936  Nancy Fetter Feb 05, 2021  1936 X-ray reports reviewed.  No signs of fracture or dislocation.  Degenerative changes noted. [JK]    Clinical Course User Index [JK] Dorie Rank, MD                           Medical Decision Making  Patient presents with complaints of pain in her hip area.  She had fallen recently.  No signs of any fracture or dislocation.  Patient has been ambulatory.  Doubt occult fracture.  Will discharge home with symptomatic care.  Recommend outpatient follow-up.        Final Clinical Impression(s) / ED Diagnoses Final diagnoses:  Left hip pain    Rx / DC Orders ED Discharge Orders          Ordered    naproxen (NAPROSYN) 250 MG tablet  2 times daily with meals        02/05/21 1936              Dorie Rank, MD 02/05/21 [phone]

## 2021-02-21 ENCOUNTER — Emergency Department (HOSPITAL_COMMUNITY)
Admission: EM | Admit: 2021-02-21 | Discharge: 2021-02-22 | Disposition: A | Discharge status: Home / Self Care | Payer: Medicare Other | Attending: Student | Admitting: Student

## 2021-02-21 DIAGNOSIS — Z79899 Other long term (current) drug therapy: Secondary | ICD-10-CM | POA: Diagnosis not present

## 2021-02-21 DIAGNOSIS — E13319 Other specified diabetes mellitus with unspecified diabetic retinopathy without macular edema: Secondary | ICD-10-CM

## 2021-02-21 DIAGNOSIS — E11311 Type 2 diabetes mellitus with unspecified diabetic retinopathy with macular edema: Secondary | ICD-10-CM | POA: Diagnosis not present

## 2021-02-21 DIAGNOSIS — Z7984 Long term (current) use of oral hypoglycemic drugs: Secondary | ICD-10-CM | POA: Diagnosis not present

## 2021-02-21 DIAGNOSIS — Z8616 Personal history of COVID-19: Secondary | ICD-10-CM | POA: Diagnosis not present

## 2021-02-21 DIAGNOSIS — R519 Headache, unspecified: Secondary | ICD-10-CM | POA: Diagnosis not present

## 2021-02-21 DIAGNOSIS — H538 Other visual disturbances: Secondary | ICD-10-CM | POA: Diagnosis present

## 2021-02-21 DIAGNOSIS — Z7982 Long term (current) use of aspirin: Secondary | ICD-10-CM | POA: Insufficient documentation

## 2021-02-21 DIAGNOSIS — I10 Essential (primary) hypertension: Secondary | ICD-10-CM | POA: Diagnosis not present

## 2021-02-21 NOTE — ED Triage Notes (Signed)
Pt c/o headache x1wk, denies N/V, denies vision changes, NAD in triage. States she's compliant w all home medication

## 2021-02-22 ENCOUNTER — Emergency Department (HOSPITAL_COMMUNITY): Payer: Medicare Other

## 2021-02-22 DIAGNOSIS — E11311 Type 2 diabetes mellitus with unspecified diabetic retinopathy with macular edema: Secondary | ICD-10-CM | POA: Diagnosis not present

## 2021-02-24 ENCOUNTER — Emergency Department (HOSPITAL_COMMUNITY)
Admission: EM | Admit: 2021-02-24 | Discharge: 2021-02-25 | Disposition: A | Discharge status: Home / Self Care | Payer: Medicare Other | Attending: Emergency Medicine | Admitting: Emergency Medicine

## 2021-02-24 ENCOUNTER — Other Ambulatory Visit: Payer: Self-pay

## 2021-02-24 ENCOUNTER — Emergency Department (HOSPITAL_COMMUNITY): Payer: Medicare Other

## 2021-02-24 ENCOUNTER — Encounter (HOSPITAL_COMMUNITY): Payer: Self-pay | Admitting: *Deleted

## 2021-02-24 DIAGNOSIS — Z7902 Long term (current) use of antithrombotics/antiplatelets: Secondary | ICD-10-CM | POA: Diagnosis not present

## 2021-02-24 DIAGNOSIS — Z79899 Other long term (current) drug therapy: Secondary | ICD-10-CM | POA: Insufficient documentation

## 2021-02-24 DIAGNOSIS — Z7982 Long term (current) use of aspirin: Secondary | ICD-10-CM | POA: Diagnosis not present

## 2021-02-24 DIAGNOSIS — D649 Anemia, unspecified: Secondary | ICD-10-CM | POA: Insufficient documentation

## 2021-02-24 DIAGNOSIS — I2510 Atherosclerotic heart disease of native coronary artery without angina pectoris: Secondary | ICD-10-CM | POA: Insufficient documentation

## 2021-02-24 DIAGNOSIS — T502X5A Adverse effect of carbonic-anhydrase inhibitors, benzothiadiazides and other diuretics, initial encounter: Secondary | ICD-10-CM

## 2021-02-24 DIAGNOSIS — I10 Essential (primary) hypertension: Secondary | ICD-10-CM | POA: Diagnosis not present

## 2021-02-24 DIAGNOSIS — E876 Hypokalemia: Secondary | ICD-10-CM | POA: Diagnosis not present

## 2021-02-24 DIAGNOSIS — R079 Chest pain, unspecified: Secondary | ICD-10-CM | POA: Diagnosis present

## 2021-02-24 DIAGNOSIS — R002 Palpitations: Secondary | ICD-10-CM

## 2021-02-24 LAB — BASIC METABOLIC PANEL
Anion gap: 12 (ref 5–15)
BUN: 15 mg/dL (ref 8–23)
CO2: 27 mmol/L (ref 22–32)
Calcium: 8.9 mg/dL (ref 8.9–10.3)
Chloride: 98 mmol/L (ref 98–111)
Creatinine, Ser: 1.34 mg/dL — ABNORMAL HIGH (ref 0.44–1.00)
GFR, Estimated: 42 mL/min — ABNORMAL LOW (ref >60)
Glucose, Bld: 124 mg/dL — ABNORMAL HIGH (ref 70–99)
Potassium: 3.4 mmol/L — ABNORMAL LOW (ref 3.5–5.1)
Sodium: 137 mmol/L (ref 135–145)

## 2021-02-24 LAB — TROPONIN I (HIGH SENSITIVITY)
Troponin I (High Sensitivity): 5 ng/L (ref <18)
Troponin I (High Sensitivity): 6 ng/L (ref <18)

## 2021-02-24 LAB — CBC
HCT: 33 % — ABNORMAL LOW (ref 36.0–46.0)
Hemoglobin: 10.6 g/dL — ABNORMAL LOW (ref 12.0–15.0)
MCH: 29.3 pg (ref 26.0–34.0)
MCHC: 32.1 g/dL (ref 30.0–36.0)
MCV: 91.2 fL (ref 80.0–100.0)
Platelets: 242 10*3/uL (ref 150–400)
RBC: 3.62 MIL/uL — ABNORMAL LOW (ref 3.87–5.11)
RDW: 13 % (ref 11.5–15.5)
WBC: 7.6 10*3/uL (ref 4.0–10.5)
nRBC: 0 % (ref 0.0–0.2)

## 2021-02-24 NOTE — ED Provider Triage Note (Signed)
Emergency Medicine Provider Triage Evaluation Note  Zeniyah Peaster , a 76 y.o. female  was evaluated in triage.  Pt complains of chest pain x1 week.  She has associated headache.  Has taken her typical medications for symptoms without relief of her symptoms.  Has not tried a medication for symptoms.  Denies fever, chills, nausea, vomiting.  Review of Systems  Positive: As per HPI above. Negative: Fever, chills  Physical Exam  BP (!) 144/58    Pulse 73    Temp 98.2 F (36.8 C) (Oral)    Resp 18    Ht 5\' 5"  (1.651 m)    Wt 63.5 kg    SpO2 98%    BMI 23.30 kg/m  Gen:   Awake, no distress   Resp:  Normal effort  MSK:   Moves extremities without difficulty  Other:  No chest wall TTP.  Medical Decision Making  Medically screening exam initiated at 8:18 PM.  Appropriate orders placed.  Giah Fickett was informed that the remainder of the evaluation will be completed by another provider, this initial triage assessment does not replace that evaluation, and the importance of remaining in the ED until their evaluation is complete.    Witt Plitt A, PA-C 02/24/21 2018

## 2021-02-24 NOTE — ED Triage Notes (Signed)
The pt is c/o a headache and chest pain for one week

## 2021-02-25 DIAGNOSIS — I10 Essential (primary) hypertension: Secondary | ICD-10-CM | POA: Diagnosis not present

## 2021-02-25 MED ORDER — POTASSIUM CHLORIDE CRYS ER 20 MEQ PO TBCR
40.0000 meq | EXTENDED_RELEASE_TABLET | Freq: Once | ORAL | Status: AC
  Administered 2021-02-25: 40 meq via ORAL
  Filled 2021-02-25: qty 2

## 2021-02-25 MED ORDER — POTASSIUM CHLORIDE CRYS ER 20 MEQ PO TBCR: 10.0000 meq | EXTENDED_RELEASE_TABLET | Freq: Every day | ORAL | 0 refills | Status: DC

## 2021-02-25 NOTE — ED Provider Notes (Signed)
[facility] EMERGENCY DEPARTMENT Provider Note   CSN: [id number] Arrival date & time: 02/24/21  1949     History  Chief Complaint  Patient presents with   Chest Pain    [name] is a 76 y.o. female.  The history is provided by the patient.  Chest Pain She has history of hypertension, diabetes, stroke, coronary artery disease and comes in because she feels like her heart is racing.  She states that she feels okay while she is still but if she gets up and moves, she can feel her heart racing and can hear it beating in her ear.  She denies chest pain, heaviness, tightness, pressure.  She denies any dyspneic.  She denies nausea or vomiting.  She had complained of headache at triage, but denies headache to me.  She states that she has been compliant with her medications.  Symptoms have been present for about a week and have not been changing.  She is concerned that her blood pressure is elevated.   Home Medications Prior to Admission medications   Medication Sig Start Date End Date Taking? Authorizing Provider  albuterol (VENTOLIN HFA) 108 (90 Base) MCG/ACT inhaler Inhale 1-2 puffs into the lungs every 6 (six) hours as needed for wheezing or shortness of breath. 04/22/20   Barbette Merino, NP  aspirin EC 81 MG tablet Take 81 mg by mouth daily. Swallow whole.    [provider]  chlorthalidone (HYGROTON) 25 MG tablet Take 1 tablet (25 mg total) by mouth daily. 10/05/20 10/05/21  Barbette Merino, NP  clopidogrel (PLAVIX) 75 MG tablet Take 1 tablet (75 mg total) by mouth daily. 04/22/20   Barbette Merino, NP  diclofenac Sodium (VOLTAREN) 1 % GEL Apply 2 g topically 4 (four) times daily as needed. 07/27/20   Tilden Fossa, MD  diphenhydramine-acetaminophen (TYLENOL PM) 25-500 MG TABS tablet Take 0.5 tablets by mouth at bedtime as needed. 1/2 tablet    [provider]  gabapentin (NEURONTIN) 300 MG capsule Take 1 capsule (300 mg total) by mouth 3 (three) times  daily. Patient taking differently: Take 300 mg by mouth daily. 10/05/20   Barbette Merino, NP  gatifloxacin (ZYMAXID) 0.5 % SOLN Place 1 drop into the right eye 4 (four) times daily. 06/28/20   [provider]  glipiZIDE (GLUCOTROL) 5 MG tablet Take 1 tablet (5 mg total) by mouth 2 (two) times daily before a meal. 04/22/20 04/22/21  Barbette Merino, NP  hydrALAZINE (APRESOLINE) 50 MG tablet Take 1 tablet (50 mg total) by mouth 2 (two) times daily. Patient taking differently: Take 50 mg by mouth daily. 04/22/20   Barbette Merino, NP  ketorolac (ACULAR) 0.5 % ophthalmic solution Place 1 drop into the right eye 4 (four) times daily. Patient not taking: Reported on 02/01/2021 06/28/20   [provider]  lidocaine (LIDODERM) 5 % Place 1 patch onto the skin daily. Remove & Discard patch within 12 hours or as directed by MD 01/10/21   Arthor Captain, PA-C  losartan (COZAAR) 50 MG tablet Take 1 tablet (50 mg total) by mouth daily. 10/05/20 10/05/21  Barbette Merino, NP  metFORMIN (GLUCOPHAGE) 500 MG tablet Take 2 tablets (1,000 mg total) by mouth 2 (two) times daily with a meal. Patient taking differently: Take 1,000 mg by mouth daily with breakfast. 10/05/20 10/05/21  Barbette Merino, NP  mirabegron ER (MYRBETRIQ) 25 MG TB24 tablet Take 25 mg by mouth daily.    [provider]  naproxen (NAPROSYN) 250 MG tablet Take 1 tablet (250 mg total) by mouth 2 (two) times daily with a meal. 02/05/21   Linwood Dibbles, MD  [facility] VERIO test strip Use 1-4 times daily as directed/needed   DX E11.9 Patient not taking: No sig reported 04/25/20   Barbette Merino, NP  potassium chloride (KLOR-CON) 10 MEQ tablet Take 10 mEq by mouth daily.    [provider]  prednisoLONE acetate (PRED FORTE) 1 % ophthalmic suspension Place 1 drop into the right eye 4 (four) times daily. Patient not taking: Reported on 02/01/2021 06/28/20   [provider]  rosuvastatin (CRESTOR) 10 MG tablet Take 1 tablet (10 mg  total) by mouth at bedtime. 10/05/20 10/05/21  Barbette Merino, NP      Allergies    Codeine, Ibuprofen, and Imdur [isosorbide nitrate]    Review of Systems   Review of Systems  Cardiovascular:  Positive for chest pain.  All other systems reviewed and are negative.  Physical Exam Updated Vital Signs BP (!) 150/50 (BP Location: Left Arm)    Pulse 65    Temp 98.4 F (36.9 C) (Oral)    Resp 16    Ht 5\' 5"  (1.651 m)    Wt 63.5 kg    SpO2 100%    BMI 23.30 kg/m  Physical Exam Vitals and nursing note reviewed.  76 year old female, resting comfortably and in no acute distress. Vital signs are significant for mildly elevated blood pressure. Oxygen saturation is 100%, which is normal. Head is normocephalic and atraumatic. PERRLA, EOMI. Oropharynx is clear. Neck is nontender and supple without adenopathy or JVD. Back is nontender and there is no CVA tenderness. Lungs are clear without rales, wheezes, or rhonchi. Chest is nontender. Heart has regular rate and rhythm without murmur. Abdomen is soft, flat, nontende. Extremities have no cyanosis or edema, full range of motion is present. Skin is warm and dry without rash. Neurologic: Mental status is normal, cranial nerves are intact, moves all extremities equally.  ED Results / Procedures / Treatments   Labs (all labs ordered are listed, but only abnormal results are displayed) Labs Reviewed  BASIC METABOLIC PANEL - Abnormal; Notable for the following components:      Result Value   Potassium 3.4 (*)    Glucose, Bld 124 (*)    Creatinine, Ser 1.34 (*)    GFR, Estimated 42 (*)    All other components within normal limits  CBC - Abnormal; Notable for the following components:   RBC 3.62 (*)    Hemoglobin 10.6 (*)    HCT 33.0 (*)    All other components within normal limits  TROPONIN I (HIGH SENSITIVITY)  TROPONIN I (HIGH SENSITIVITY)    EKG ED ECG REPORT   Date: 02/25/2021  Rate: 70  Rhythm: normal sinus rhythm  QRS Axis:  normal  Intervals: normal  ST/T Wave abnormalities: nonspecific T wave changes  Conduction Disutrbances:none  Narrative Interpretation: Normal sinus rhythm, minor nonspecific T wave flattening diffusely.  When compared with ECG of 02/03/2021, no significant changes are seen.  Old EKG Reviewed: unchanged  I have personally reviewed the EKG tracing and agree with the computerized printout as noted.   Radiology DG Chest 1 View  Result Date: 02/24/2021 CLINICAL DATA:  Chest pain EXAM: CHEST  1 VIEW COMPARISON:  02/02/2021 FINDINGS: Cardiac and mediastinal contours are within normal limits. No focal pulmonary opacity. No pleural effusion or pneumothorax. No acute osseous abnormality.  Partially imaged ACDF. IMPRESSION: No acute cardiopulmonary process. Electronically Signed   By: Wiliam KeAlison  Vasan M.D.   On: 02/24/2021 20:40    Procedures Procedures    Medications Ordered in ED Medications  potassium chloride SA (KLOR-CON M) CR tablet 40 mEq (has no administration in time range)    ED Course/ Medical Decision Making/ A&P                           Medical Decision Making  Subjective palpitations.  I believe that her main problem is actually that she is hearing her blood flow through her carotid arteries near the ear.  Old records are reviewed, and she has multiple ED visits with similar complaints.  This is her eighth ED visit in the last month.  ECG is unchanged from prior.  Chest x-ray shows no acute process.  I have independently viewed the images, and agree with the radiologist's interpretation.  Labs show stable anemia, mild hypokalemia which is probably related to her use of chlorthalidone.  She is given a dose of oral potassium.  She is supposed to be taking potassium 10 mEq once a day, will change the prescription to 20 mill equivalent tablet.  Patient is reassured that her evaluation did not show any sign of serious problems, she is referred back to her primary care provider for  follow-up.        Final Clinical Impression(s) / ED Diagnoses Final diagnoses:  Chest pain  Palpitations  Diuretic-induced hypokalemia  Elevated blood pressure reading with diagnosis of hypertension  Normochromic normocytic anemia    Rx / DC Orders ED Discharge Orders          Ordered    potassium chloride (KLOR-CON M) 20 MEQ tablet  Daily        02/25/21 0316              Dione BoozeGlick, Kemo Spruce, MD 02/25/21 [phone]

## 2021-02-25 NOTE — ED Notes (Signed)
Patient discharge instructions reviewed with the patient. The patient verbalized understanding of instructions. Patient discharged. 

## 2021-02-26 NOTE — ED Provider Notes (Signed)
[facility] EMERGENCY DEPARTMENT Provider Note  CSN: [id number] Arrival date & time: 02/21/21 2150  Chief Complaint(s) Headache  HPI [name] is a 76 y.o. female who presents emergency department for evaluation of headache.  Patient told triage nursing that her headaches been present for 1 week, but on my evaluation patient currently has no complaints including no headache.  She states that she is still having blurry vision which on review of previous visits has been diagnosed as likely diabetic retinopathy.  Patient has not followed up with an ophthalmologist.  She arrives to the emergency department requesting a ride home.  Denies chest pain, shortness of breath, Donnell pain, nausea, vomiting or other systemic symptoms.   Headache  Past Medical History Past Medical History:  Diagnosis Date   Diabetes mellitus without complication ([place])    GERD 09/04/2006   Qualifier: Diagnosis of  By: Suzie Portela     History of echocardiogram    a. 2D ECHO: 11/06/2013 EF 65%; no WMA. Mild TR. 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 Authorizing Provider  albuterol (VENTOLIN HFA) 108 (90 Base) MCG/ACT inhaler Inhale 1-2 puffs into the lungs every 6 (six) hours as needed for wheezing or shortness of breath. 04/22/20   Vevelyn Francois, NP  aspirin EC 81 MG tablet Take 81 mg by mouth daily. Swallow whole.    [provider]  chlorthalidone (HYGROTON) 25 MG tablet Take 1 tablet (25 mg total) by mouth daily. 10/05/20 10/05/21  Vevelyn Francois, NP  clopidogrel (PLAVIX) 75 MG tablet Take 1 tablet (75 mg total) by mouth daily. 04/22/20   Vevelyn Francois, NP  diclofenac Sodium (VOLTAREN) 1 % GEL Apply 2 g topically 4 (four) times daily as needed. 07/27/20   Quintella Reichert, MD  diphenhydramine-acetaminophen (TYLENOL PM) 25-500 MG TABS tablet Take 0.5 tablets by mouth at bedtime as needed. 1/2 tablet    [provider]  gabapentin (NEURONTIN) 300 MG capsule Take 1 capsule (300 mg total) by mouth 3 (three) times daily. Patient taking differently: Take 300 mg by mouth daily. 10/05/20   Vevelyn Francois, NP  gatifloxacin (ZYMAXID) 0.5 % SOLN Place 1 drop into the right eye 4 (four) times daily. 06/28/20   [provider]  glipiZIDE (GLUCOTROL) 5 MG tablet Take 1 tablet (5 mg total) by mouth 2 (two) times daily before a meal. 04/22/20 04/22/21  Vevelyn Francois, NP  hydrALAZINE (APRESOLINE) 50 MG tablet Take 1 tablet (50 mg total) by mouth 2 (two) times daily. Patient taking differently: Take 50 mg by mouth daily. 04/22/20   Vevelyn Francois, NP  ketorolac (ACULAR) 0.5 % ophthalmic solution Place 1 drop into the right eye 4 (four) times daily. Patient not taking: Reported on 02/01/2021 06/28/20   [provider]  lidocaine (LIDODERM) 5 % Place 1 patch onto the skin daily. Remove & Discard patch within 12 hours or as directed by MD 01/10/21   Margarita Mail, PA-C  losartan (COZAAR) 50 MG tablet Take 1 tablet (50 mg total) by mouth daily. 10/05/20 10/05/21  Vevelyn Francois, NP  metFORMIN (GLUCOPHAGE)  500 MG tablet Take 2 tablets (1,000 mg total) by mouth 2 (two) times daily with a meal. Patient taking differently: Take 1,000 mg by mouth daily with breakfast. 10/05/20 10/05/21  Vevelyn Francois, NP  mirabegron ER (MYRBETRIQ) 25 MG TB24 tablet Take 25 mg by mouth daily.    [provider]  naproxen (NAPROSYN) 250 MG tablet Take 1 tablet (250 mg total) by mouth 2 (two) times daily with a meal. 02/05/21   Dorie Rank, MD  [facility] VERIO test strip Use 1-4 times daily as directed/needed   DX E11.9 Patient not taking: No sig reported 04/25/20   Vevelyn Francois, NP  potassium chloride (KLOR-CON M) 20 MEQ tablet Take 0.5 tablets (10 mEq total) by mouth daily. [id number]   Delora Fuel, MD  prednisoLONE acetate (PRED FORTE) 1 % ophthalmic suspension Place 1 drop into the right eye 4 (four) times daily. Patient not taking: Reported on 02/01/2021 06/28/20   [provider]  rosuvastatin (CRESTOR) 10 MG tablet Take 1 tablet (10 mg total) by mouth at bedtime. 10/05/20 10/05/21  Vevelyn Francois, NP                                                                                                                                    Past Surgical History Past Surgical History:  Procedure Laterality Date   ABDOMINAL HYSTERECTOMY     ANTERIOR CERVICAL DECOMPRESSION/DISCECTOMY FUSION 4 LEVELS N/A 11/11/2019   Procedure: Cervical three-four Cervical four-five Cervical five-six Cervical six-seven  Anterior cervical decompression/discectomy/fusion;  Surgeon: Erline Levine, MD;  Location: [place];  Service: Neurosurgery;  Laterality: N/A;   LEFT HEART CATHETERIZATION WITH CORONARY ANGIOGRAM N/A 11/05/2013   Procedure: LEFT HEART CATHETERIZATION WITH CORONARY ANGIOGRAM;  Surgeon: Troy Sine, MD;  Location: [facility] CATH LAB;  Service: Cardiovascular;  Laterality: N/A;   Family History No family history on file.  Social History Social History   Tobacco Use   Smoking status: Never   Smokeless tobacco: Never  Vaping  Use   Vaping Use: Never used  Substance Use Topics   Alcohol use: No   Drug use: No   Allergies Codeine, Ibuprofen, and Imdur [isosorbide nitrate]  Review of Systems Review of Systems  Neurological:  Positive for headaches.   Physical Exam Vital Signs  I have reviewed the triage vital signs BP (!) 142/62 (BP Location: Left Arm)    Pulse 64  Temp 98.5 F (36.9 C) (Oral)    Resp 16    SpO2 98%   Physical Exam Vitals and nursing note reviewed.  Constitutional:      General: She is not in acute distress.    Appearance: She is well-developed.  HENT:     Head: Normocephalic and atraumatic.  Eyes:     Conjunctiva/sclera: Conjunctivae normal.  Cardiovascular:     Rate and Rhythm: Normal rate and regular rhythm.     Heart sounds: No murmur heard. Pulmonary:     Effort: Pulmonary effort is normal. No respiratory distress.     Breath sounds: Normal breath sounds.  Abdominal:     Palpations: Abdomen is soft.     Tenderness: There is no abdominal tenderness.  Musculoskeletal:        General: No swelling.     Cervical back: Neck supple.  Skin:    General: Skin is warm and dry.     Capillary Refill: Capillary refill takes less than 2 seconds.  Neurological:     Mental Status: She is alert.  Psychiatric:        Mood and Affect: Mood normal.    ED Results and Treatments Labs (all labs ordered are listed, but only abnormal results are displayed) Labs Reviewed - No data to display                                                                                                                        Radiology No results found.  Pertinent labs & imaging results that were available during my care of the patient were reviewed by me and considered in my medical decision making (see MDM for details).  Medications Ordered in ED Medications - No data to display                                                                                                                                    Procedures Procedures  (including critical care time)  Medical Decision Making / ED Course   This patient presents to the ED for concern of headaches, this involves an extensive number of treatment options, and is a complaint that carries with it a high risk of complications and morbidity.  The differential diagnosis includes tension headache, migraine headache, migraine  MDM: Patient seen emergency department for evaluation of headache.  Physical exam is unremarkable.  On my evaluation patient is  not complaining of a headache.  CT obtained from triage which was reassuringly negative.  Patient again instructed to follow-up with an outpatient ophthalmologist for her diabetic retinopathy.  Patient requesting a ride home.  Patient then discharged with resources to follow-up with an ophthalmologist.   Additional history obtained:  -External records from outside source obtained and reviewed including: Chart review including previous notes, labs, imaging, consultation notes   Lab Tests: -I ordered, reviewed, and interpreted labs.   The pertinent results include:   Labs Reviewed - No data to display       Imaging Studies ordered: I ordered imaging studies including CTH I independently visualized and interpreted imaging. I agree with the radiologist interpretation   Medicines ordered and prescription drug management: No orders of the defined types were placed in this encounter.   -I have reviewed the patients home medicines and have made adjustments as needed   Social Determinants of Health:  Factors impacting patients care include: Low health literacy, difficulty with transportation   Reevaluation: After the interventions noted above, I reevaluated the patient and found that they have :improved  Co morbidities that complicate the patient evaluation  Past Medical History:  Diagnosis Date   Diabetes mellitus without complication ([place])    GERD 09/04/2006   Qualifier:  Diagnosis of  By: Suzie Portela     History of echocardiogram    a. 2D ECHO: 11/06/2013 EF 65%; no WMA. Mild TR. PA pk pressure 43 mm HG   Hypertension    Stroke [facility])       Dispostion: I considered admission for this patient, but she is currently asymptomatic and does not meet admission criteria.  Patient safe for outpatient follow-up.     Final Clinical Impression(s) / ED Diagnoses Final diagnoses:  Nonintractable headache, unspecified chronicity pattern, unspecified headache type  Diabetic retinopathy associated with diabetes mellitus of other type, macular edema presence unspecified, unspecified laterality, unspecified retinopathy severity [facility])     @PCDICTATION @    Teressa Lower, MD 02/26/21 2311

## 2021-03-01 ENCOUNTER — Other Ambulatory Visit: Payer: Self-pay

## 2021-03-01 ENCOUNTER — Emergency Department (HOSPITAL_COMMUNITY): Payer: Medicare Other

## 2021-03-01 ENCOUNTER — Emergency Department (HOSPITAL_COMMUNITY)
Admission: EM | Admit: 2021-03-01 | Discharge: 2021-03-02 | Disposition: A | Discharge status: Home / Self Care | Payer: Medicare Other | Attending: Emergency Medicine | Admitting: Emergency Medicine

## 2021-03-01 DIAGNOSIS — E119 Type 2 diabetes mellitus without complications: Secondary | ICD-10-CM | POA: Insufficient documentation

## 2021-03-01 DIAGNOSIS — I2510 Atherosclerotic heart disease of native coronary artery without angina pectoris: Secondary | ICD-10-CM | POA: Insufficient documentation

## 2021-03-01 DIAGNOSIS — R079 Chest pain, unspecified: Secondary | ICD-10-CM

## 2021-03-01 DIAGNOSIS — I10 Essential (primary) hypertension: Secondary | ICD-10-CM | POA: Diagnosis not present

## 2021-03-01 DIAGNOSIS — Z7984 Long term (current) use of oral hypoglycemic drugs: Secondary | ICD-10-CM | POA: Insufficient documentation

## 2021-03-01 DIAGNOSIS — Z79899 Other long term (current) drug therapy: Secondary | ICD-10-CM | POA: Diagnosis not present

## 2021-03-01 DIAGNOSIS — R0789 Other chest pain: Secondary | ICD-10-CM | POA: Diagnosis not present

## 2021-03-01 DIAGNOSIS — Z7982 Long term (current) use of aspirin: Secondary | ICD-10-CM | POA: Insufficient documentation

## 2021-03-01 LAB — CBC
HCT: 34.8 % — ABNORMAL LOW (ref 36.0–46.0)
Hemoglobin: 11.2 g/dL — ABNORMAL LOW (ref 12.0–15.0)
MCH: 29.3 pg (ref 26.0–34.0)
MCHC: 32.2 g/dL (ref 30.0–36.0)
MCV: 91.1 fL (ref 80.0–100.0)
Platelets: 244 10*3/uL (ref 150–400)
RBC: 3.82 MIL/uL — ABNORMAL LOW (ref 3.87–5.11)
RDW: 13.2 % (ref 11.5–15.5)
WBC: 8.3 10*3/uL (ref 4.0–10.5)
nRBC: 0 % (ref 0.0–0.2)

## 2021-03-01 LAB — BASIC METABOLIC PANEL
Anion gap: 13 (ref 5–15)
BUN: 20 mg/dL (ref 8–23)
CO2: 26 mmol/L (ref 22–32)
Calcium: 8.8 mg/dL — ABNORMAL LOW (ref 8.9–10.3)
Chloride: 99 mmol/L (ref 98–111)
Creatinine, Ser: 1.5 mg/dL — ABNORMAL HIGH (ref 0.44–1.00)
GFR, Estimated: 36 mL/min — ABNORMAL LOW (ref >60)
Glucose, Bld: 191 mg/dL — ABNORMAL HIGH (ref 70–99)
Potassium: 3.5 mmol/L (ref 3.5–5.1)
Sodium: 138 mmol/L (ref 135–145)

## 2021-03-01 LAB — TROPONIN I (HIGH SENSITIVITY)
Troponin I (High Sensitivity): 6 ng/L (ref <18)
Troponin I (High Sensitivity): 6 ng/L (ref <18)

## 2021-03-01 NOTE — ED Notes (Signed)
Pt no answer for VSx1

## 2021-03-01 NOTE — ED Triage Notes (Signed)
C/O chest pain started yesterday while watching  TV; pain is pressure like in nature, and constant.

## 2021-03-01 NOTE — ED Provider Triage Note (Signed)
Emergency Medicine Provider Triage Evaluation Note  [name] , a 76 y.o. female  was evaluated in triage.  Pt complains of chest pain and headache since this morning.  Patient states that she was sitting down watching TV when the chest pain began.  Patient believes that it is due to her blood pressure being elevated, also complaining of headache.  Patient denies any history of cardiac issues.  Review of Systems  Positive: Chest pain, shortness of breath, headache Negative: Nausea, vomiting, back pain, blurred vision  Physical Exam  BP (!) 175/72    Pulse 88    Temp 97.7 F (36.5 C) (Oral)    Resp 18    Ht 5\' 5"  (1.651 m)    Wt 63.5 kg    SpO2 100%    BMI 23.30 kg/m  Gen:   Awake, no distress   Resp:  Normal effort  MSK:   Moves extremities without difficulty  Other:  No neuro deficits  Medical Decision Making  Medically screening exam initiated at 4:14 PM.  Appropriate orders placed.  [name] was informed that the remainder of the evaluation will be completed by another provider, this initial triage assessment does not replace that evaluation, and the importance of remaining in the ED until their evaluation is complete.     [name], PA-C 03/01/21 1614

## 2021-03-02 NOTE — Discharge Instructions (Signed)
You have been seen and discharged from the emergency department.  Your cardiac work-up was normal.  Follow-up with your primary provider for further evaluation and further care. Take home medications as prescribed. If you have any worsening symptoms or further concerns for your health please return to an emergency department for further evaluation. ?

## 2021-03-02 NOTE — ED Provider Notes (Signed)
[facility] EMERGENCY DEPARTMENT Provider Note   CSN: [id number] Arrival date & time: 03/01/21  1556     History  Chief Complaint  Patient presents with   Chest Pain    Rumor Sun is a 76 y.o. female.  HPI  76 year old female past medical history of HTN, DM, CAD, CVA presents emergency department after an episode of chest pain.  This started last evening while she was watching TV.  Patient describes the chest discomfort as midsternal chest heaviness, no associated shortness of breath, back pain, lower extremity swelling.  No recent illness, fever, cough.  Currently she feels back to baseline but endorses fatigue and being hungry.  Home Medications Prior to Admission medications   Medication Sig Start Date End Date Taking? Authorizing Provider  albuterol (VENTOLIN HFA) 108 (90 Base) MCG/ACT inhaler Inhale 1-2 puffs into the lungs every 6 (six) hours as needed for wheezing or shortness of breath. 04/22/20   Barbette Merino, NP  aspirin EC 81 MG tablet Take 81 mg by mouth daily. Swallow whole.    [provider]  chlorthalidone (HYGROTON) 25 MG tablet Take 1 tablet (25 mg total) by mouth daily. 10/05/20 10/05/21  Barbette Merino, NP  clopidogrel (PLAVIX) 75 MG tablet Take 1 tablet (75 mg total) by mouth daily. 04/22/20   Barbette Merino, NP  diclofenac Sodium (VOLTAREN) 1 % GEL Apply 2 g topically 4 (four) times daily as needed. 07/27/20   Tilden Fossa, MD  diphenhydramine-acetaminophen (TYLENOL PM) 25-500 MG TABS tablet Take 0.5 tablets by mouth at bedtime as needed. 1/2 tablet    [provider]  gabapentin (NEURONTIN) 300 MG capsule Take 1 capsule (300 mg total) by mouth 3 (three) times daily. Patient taking differently: Take 300 mg by mouth daily. 10/05/20   Barbette Merino, NP  gatifloxacin (ZYMAXID) 0.5 % SOLN Place 1 drop into the right eye 4 (four) times daily. 06/28/20   [provider]  glipiZIDE (GLUCOTROL) 5 MG tablet Take 1 tablet (5 mg  total) by mouth 2 (two) times daily before a meal. 04/22/20 04/22/21  Barbette Merino, NP  hydrALAZINE (APRESOLINE) 50 MG tablet Take 1 tablet (50 mg total) by mouth 2 (two) times daily. Patient taking differently: Take 50 mg by mouth daily. 04/22/20   Barbette Merino, NP  ketorolac (ACULAR) 0.5 % ophthalmic solution Place 1 drop into the right eye 4 (four) times daily. Patient not taking: Reported on 02/01/2021 06/28/20   [provider]  lidocaine (LIDODERM) 5 % Place 1 patch onto the skin daily. Remove & Discard patch within 12 hours or as directed by MD 01/10/21   Arthor Captain, PA-C  losartan (COZAAR) 50 MG tablet Take 1 tablet (50 mg total) by mouth daily. 10/05/20 10/05/21  Barbette Merino, NP  metFORMIN (GLUCOPHAGE) 500 MG tablet Take 2 tablets (1,000 mg total) by mouth 2 (two) times daily with a meal. Patient taking differently: Take 1,000 mg by mouth daily with breakfast. 10/05/20 10/05/21  Barbette Merino, NP  mirabegron ER (MYRBETRIQ) 25 MG TB24 tablet Take 25 mg by mouth daily.    [provider]  naproxen (NAPROSYN) 250 MG tablet Take 1 tablet (250 mg total) by mouth 2 (two) times daily with a meal. 02/05/21   Linwood Dibbles, MD  [facility] VERIO test strip Use 1-4 times daily as directed/needed   DX E11.9 Patient not taking: No sig reported 04/25/20   Barbette Merino, NP  potassium chloride (KLOR-CON  M) 20 MEQ tablet Take 0.5 tablets (10 mEq total) by mouth daily. 02/25/21   Dione Booze, MD  prednisoLONE acetate (PRED FORTE) 1 % ophthalmic suspension Place 1 drop into the right eye 4 (four) times daily. Patient not taking: Reported on 02/01/2021 06/28/20   [provider]  rosuvastatin (CRESTOR) 10 MG tablet Take 1 tablet (10 mg total) by mouth at bedtime. 10/05/20 10/05/21  Barbette Merino, NP      Allergies    Codeine, Ibuprofen, and Imdur [isosorbide nitrate]    Review of Systems   Review of Systems  Constitutional:  Positive for fatigue. Negative for fever.  Respiratory:   Positive for chest tightness. Negative for shortness of breath.   Cardiovascular:  Negative for chest pain, palpitations and leg swelling.  Gastrointestinal:  Negative for abdominal pain, diarrhea and vomiting.  Genitourinary:  Negative for flank pain.  Musculoskeletal:  Negative for back pain.  Skin:  Negative for rash.  Neurological:  Negative for headaches.   Physical Exam Updated Vital Signs BP (!) 168/75    Pulse 63    Temp 97.9 F (36.6 C) (Oral)    Resp 13    Ht 5\' 5"  (1.651 m)    Wt 63.5 kg    SpO2 99%    BMI 23.30 kg/m  Physical Exam Vitals and nursing note reviewed.  Constitutional:      General: She is not in acute distress.    Appearance: Normal appearance. She is not ill-appearing or diaphoretic.  HENT:     Head: Normocephalic.     Mouth/Throat:     Mouth: Mucous membranes are moist.  Cardiovascular:     Rate and Rhythm: Normal rate.  Pulmonary:     Effort: Pulmonary effort is normal. No respiratory distress.     Breath sounds: No decreased breath sounds.  Abdominal:     Palpations: Abdomen is soft.     Tenderness: There is no abdominal tenderness.  Musculoskeletal:     Right lower leg: No edema.     Left lower leg: No edema.  Skin:    General: Skin is warm.  Neurological:     Mental Status: She is alert and oriented to person, place, and time. Mental status is at baseline.  Psychiatric:        Mood and Affect: Mood normal.    ED Results / Procedures / Treatments   Labs (all labs ordered are listed, but only abnormal results are displayed) Labs Reviewed  BASIC METABOLIC PANEL - Abnormal; Notable for the following components:      Result Value   Glucose, Bld 191 (*)    Creatinine, Ser 1.50 (*)    Calcium 8.8 (*)    GFR, Estimated 36 (*)    All other components within normal limits  CBC - Abnormal; Notable for the following components:   RBC 3.82 (*)    Hemoglobin 11.2 (*)    HCT 34.8 (*)    All other components within normal limits  TROPONIN I  (HIGH SENSITIVITY)  TROPONIN I (HIGH SENSITIVITY)    EKG EKG Interpretation  Date/Time:  Wednesday March 01 2021 15:55:39 EST Ventricular Rate:  81 PR Interval:  118 QRS Duration: 62 QT Interval:  322 QTC Calculation: 374 R Axis:   60 Text Interpretation: Normal sinus rhythm with sinus arrhythmia T wave abnormality, consider inferior ischemia Abnormal ECG When compared with ECG of 24-Feb-2021 20:22, PREVIOUS ECG IS PRESENT Similar to previous Confirmed by 26-Feb-2021 [phone]) on 03/02/2021  8:12:45 AM  Radiology DG Chest 1 View  Result Date: 03/01/2021 CLINICAL DATA:  Chest pain beginning yesterday. EXAM: CHEST  1 VIEW COMPARISON:  02/24/2021 FINDINGS: The heart size and mediastinal contours are within normal limits. Both lungs are clear. Cervical spine fusion hardware again noted. IMPRESSION: No active disease. Electronically Signed   By: Danae Orleans M.D.   On: 03/01/2021 17:42    Procedures Procedures    Medications Ordered in ED Medications - No data to display  ED Course/ Medical Decision Making/ A&P                           Medical Decision Making  75 year old female presents emergency department after an episode of chest heaviness. Currently resolved, feels back to baseline.  Vital stable on arrival.  No distress.  No respiratory symptoms.  EKG on arrival is unchanged for the patient.  Blood work is reassuring, troponins are negative with no delta.  Chest x-ray is clear.  Very low suspicion for PE given no shortness of breath, hypoxia/tachycardia.  Low suspicion for dissection given absence of symptoms.  Patient is otherwise very well-appearing.  No signs of heart failure.  Patient at this time appears safe and stable for discharge and close outpatient follow up. Discharge plan and strict return to ED precautions discussed, patient verbalizes understanding and agreement.        Final Clinical Impression(s) / ED Diagnoses Final diagnoses:  Chest pain    Rx /  DC Orders ED Discharge Orders     None         Rozelle Logan, DO 03/02/21 2536

## 2021-03-07 ENCOUNTER — Encounter (HOSPITAL_COMMUNITY): Payer: Self-pay | Admitting: Emergency Medicine

## 2021-03-07 ENCOUNTER — Other Ambulatory Visit: Payer: Self-pay

## 2021-03-07 ENCOUNTER — Emergency Department (HOSPITAL_COMMUNITY)
Admission: EM | Admit: 2021-03-07 | Discharge: 2021-03-08 | Disposition: A | Discharge status: Home / Self Care | Payer: Medicare Other | Attending: Emergency Medicine | Admitting: Emergency Medicine

## 2021-03-07 DIAGNOSIS — I10 Essential (primary) hypertension: Secondary | ICD-10-CM | POA: Insufficient documentation

## 2021-03-07 DIAGNOSIS — Z7984 Long term (current) use of oral hypoglycemic drugs: Secondary | ICD-10-CM | POA: Diagnosis not present

## 2021-03-07 DIAGNOSIS — E119 Type 2 diabetes mellitus without complications: Secondary | ICD-10-CM | POA: Insufficient documentation

## 2021-03-07 DIAGNOSIS — Z7982 Long term (current) use of aspirin: Secondary | ICD-10-CM | POA: Diagnosis not present

## 2021-03-07 DIAGNOSIS — Z79899 Other long term (current) drug therapy: Secondary | ICD-10-CM | POA: Diagnosis not present

## 2021-03-07 DIAGNOSIS — Z7901 Long term (current) use of anticoagulants: Secondary | ICD-10-CM | POA: Insufficient documentation

## 2021-03-07 DIAGNOSIS — G8929 Other chronic pain: Secondary | ICD-10-CM | POA: Insufficient documentation

## 2021-03-07 DIAGNOSIS — M25562 Pain in left knee: Secondary | ICD-10-CM | POA: Diagnosis not present

## 2021-03-07 DIAGNOSIS — R519 Headache, unspecified: Secondary | ICD-10-CM | POA: Insufficient documentation

## 2021-03-07 DIAGNOSIS — M25561 Pain in right knee: Secondary | ICD-10-CM | POA: Diagnosis not present

## 2021-03-07 MED ORDER — ACETAMINOPHEN 325 MG PO TABS
650.0000 mg | ORAL_TABLET | Freq: Once | ORAL | Status: DC
  Filled 2021-03-07: qty 2

## 2021-03-07 NOTE — ED Provider Triage Note (Signed)
Emergency Medicine Provider Triage Evaluation Note  [name] , a 76 y.o. female  was evaluated in triage.  Pt complains of headache, reports frontal, going on for a week or so, gradual onset, steady progression. Hx of similar, she thinks its her blood pressure causing this. Reports chronic blurry vision, no acute change- reports she needs to see an eye doctor.  Review of Systems  Positive: Headache, blurry vision Negative: Chest pain, numbness, weakness, speech change, dizziness  Physical Exam  BP (!) 151/66 (BP Location: Left Arm)    Pulse 74    Temp 98.3 F (36.8 C)    Resp 15    Ht 5\' 5"  (1.651 m)    Wt 63.5 kg    SpO2 98%    BMI 23.30 kg/m  Gen:   Awake, no distress   Resp:  Normal effort  MSK:   Moves extremities without difficulty  Other:  PERRL. EOMI. No facial droop. Sensation/strength grossly intact x 4.   Medical Decision Making  Medically screening exam initiated at 10:39 PM.  Appropriate orders placed.  [name] was informed that the remainder of the evaluation will be completed by another provider, this initial triage assessment does not replace that evaluation, and the importance of remaining in the ED until their evaluation is complete.  Headache  Seen in ED for same, most recently had CT head 02/01, no focal neuro deficits today, BP elevated but not significantly, will provide tylenol for supportive care at this time.    [name], PA-C 03/07/21 2240

## 2021-03-07 NOTE — ED Triage Notes (Signed)
Pt reported ED with c/o headache that has been going ong "all week". Pt denies any recent trauma to head or nausea/ vomiting. Reports "pressure all over".

## 2021-03-08 DIAGNOSIS — R519 Headache, unspecified: Secondary | ICD-10-CM | POA: Diagnosis not present

## 2021-03-08 NOTE — Progress Notes (Signed)
Orthopedic Tech Progress Note Patient Details:  [name] November 18, 1945 [id number]  Ortho Devices Type of Ortho Device: Knee Sleeve Ortho Device/Splint Location: Bi LE Ortho Device/Splint Interventions: Application   Post Interventions Patient Tolerated: Well  [name] 03/08/2021, 8:08 AM

## 2021-03-08 NOTE — Discharge Instructions (Signed)
Your history and exam are overall reassuring.  Please follow-up with your PCP for further management of your chronic knee pains and your chronic headaches.  Please take Tylenol to help with the headaches and use the knee supports as best you can.  Please rest and stay hydrated and if any symptoms change or worsen acutely, return to the nearest emergency department.

## 2021-03-08 NOTE — ED Provider Notes (Signed)
[facility] EMERGENCY DEPARTMENT Provider Note   CSN: [id number] Arrival date & time: 03/07/21  2200     History  Chief Complaint  Patient presents with   Headache    [name] is a 76 y.o. female.  The history is provided by the patient and medical records. No language interpreter was used.  Headache Pain location:  Generalized Quality:  Dull Severity currently:  0/10 Severity at highest:  Unable to specify Onset quality:  Gradual Timing:  Intermittent Progression:  Resolved Chronicity:  Chronic Similar to prior headaches: yes   Relieved by:  Nothing Worsened by:  Nothing Ineffective treatments:  None tried Associated symptoms: no abdominal pain, no back pain, no blurred vision, no congestion, no cough, no diarrhea, no dizziness, no fatigue, no fever, no nausea, no neck pain, no neck stiffness, no numbness, no paresthesias, no photophobia, no seizures, no tingling, no visual change, no vomiting and no weakness       Home Medications Prior to Admission medications   Medication Sig Start Date End Date Taking? Authorizing Provider  albuterol (VENTOLIN HFA) 108 (90 Base) MCG/ACT inhaler Inhale 1-2 puffs into the lungs every 6 (six) hours as needed for wheezing or shortness of breath. 04/22/20   Barbette Merino, NP  aspirin EC 81 MG tablet Take 81 mg by mouth daily. Swallow whole.    [provider]  chlorthalidone (HYGROTON) 25 MG tablet Take 1 tablet (25 mg total) by mouth daily. 10/05/20 10/05/21  Barbette Merino, NP  clopidogrel (PLAVIX) 75 MG tablet Take 1 tablet (75 mg total) by mouth daily. 04/22/20   Barbette Merino, NP  diclofenac Sodium (VOLTAREN) 1 % GEL Apply 2 g topically 4 (four) times daily as needed. 07/27/20   Tilden Fossa, MD  diphenhydramine-acetaminophen (TYLENOL PM) 25-500 MG TABS tablet Take 0.5 tablets by mouth at bedtime as needed. 1/2 tablet    [provider]  gabapentin (NEURONTIN) 300 MG capsule Take 1 capsule (300  mg total) by mouth 3 (three) times daily. Patient taking differently: Take 300 mg by mouth daily. 10/05/20   Barbette Merino, NP  gatifloxacin (ZYMAXID) 0.5 % SOLN Place 1 drop into the right eye 4 (four) times daily. 06/28/20   [provider]  glipiZIDE (GLUCOTROL) 5 MG tablet Take 1 tablet (5 mg total) by mouth 2 (two) times daily before a meal. 04/22/20 04/22/21  Barbette Merino, NP  hydrALAZINE (APRESOLINE) 50 MG tablet Take 1 tablet (50 mg total) by mouth 2 (two) times daily. Patient taking differently: Take 50 mg by mouth daily. 04/22/20   Barbette Merino, NP  ketorolac (ACULAR) 0.5 % ophthalmic solution Place 1 drop into the right eye 4 (four) times daily. Patient not taking: Reported on 02/01/2021 06/28/20   [provider]  lidocaine (LIDODERM) 5 % Place 1 patch onto the skin daily. Remove & Discard patch within 12 hours or as directed by MD 01/10/21   Arthor Captain, PA-C  losartan (COZAAR) 50 MG tablet Take 1 tablet (50 mg total) by mouth daily. 10/05/20 10/05/21  Barbette Merino, NP  metFORMIN (GLUCOPHAGE) 500 MG tablet Take 2 tablets (1,000 mg total) by mouth 2 (two) times daily with a meal. Patient taking differently: Take 1,000 mg by mouth daily with breakfast. 10/05/20 10/05/21  Barbette Merino, NP  mirabegron ER (MYRBETRIQ) 25 MG TB24 tablet Take 25 mg by mouth daily.    [provider]  naproxen (NAPROSYN) 250 MG tablet Take 1  tablet (250 mg total) by mouth 2 (two) times daily with a meal. 02/05/21   Linwood DibblesKnapp, Jon, MD  [facility] test [facility] Use 1-4 times daily as directed/needed   DX E11.9 Patient not taking: No sig reported 04/25/20   Barbette MerinoKing, Crystal M, NP  potassium chloride (KLOR-CON M) 20 MEQ tablet Take 0.5 tablets (10 mEq total) by mouth daily. 02/25/21   Dione BoozeGlick, David, MD  prednisoLONE acetate (PRED FORTE) 1 % ophthalmic suspension Place 1 drop into the right eye 4 (four) times daily. Patient not taking: Reported on 02/01/2021 06/28/20   [provider]   rosuvastatin (CRESTOR) 10 MG tablet Take 1 tablet (10 mg total) by mouth at bedtime. 10/05/20 10/05/21  Barbette MerinoKing, Crystal M, NP      Allergies    Codeine, Ibuprofen, and Imdur [isosorbide nitrate]    Review of Systems   Review of Systems  Constitutional:  Negative for chills, fatigue and fever.  HENT:  Negative for congestion.   Eyes:  Negative for blurred vision and photophobia.  Respiratory:  Negative for cough, chest tightness, shortness of breath and wheezing.   Cardiovascular:  Negative for chest pain, palpitations and leg swelling.  Gastrointestinal:  Negative for abdominal pain, constipation, diarrhea, nausea and vomiting.  Genitourinary:  Negative for dysuria and frequency.  Musculoskeletal:  Negative for back pain, neck pain and neck stiffness.  Skin:  Negative for rash and wound.  Neurological:  Positive for headaches. Negative for dizziness, seizures, weakness, light-headedness, numbness and paresthesias.  Psychiatric/Behavioral:  Negative for agitation and confusion.   All other systems reviewed and are negative.  Physical Exam Updated Vital Signs BP 139/73 (BP Location: Left Arm)    Pulse 66    Temp 98.3 F (36.8 C)    Resp 14    Ht 5\' 5"  (1.651 m)    Wt 63.5 kg    SpO2 98%    BMI 23.30 kg/m  Physical Exam Vitals and nursing note reviewed.  Constitutional:      General: She is not in acute distress.    Appearance: She is well-developed. She is not ill-appearing, toxic-appearing or diaphoretic.  HENT:     Head: Normocephalic and atraumatic.     Mouth/Throat:     Mouth: Mucous membranes are moist.  Eyes:     Conjunctiva/sclera: Conjunctivae normal.  Cardiovascular:     Rate and Rhythm: Normal rate and regular rhythm.     Heart sounds: No murmur heard. Pulmonary:     Effort: Pulmonary effort is normal. No respiratory distress.     Breath sounds: Normal breath sounds. No wheezing, rhonchi or rales.  Chest:     Chest wall: No tenderness.  Abdominal:     Palpations:  Abdomen is soft.     Tenderness: There is no abdominal tenderness.  Musculoskeletal:        General: No swelling.     Cervical back: Neck supple.     Right knee: Tenderness present.     Left knee: Tenderness present.       Legs:     Comments: Intact sensation, strength, and pulses distally.  Some tenderness in both knees.  Exam otherwise unremarkable.  Skin:    General: Skin is warm and dry.     Capillary Refill: Capillary refill takes less than 2 seconds.  Neurological:     Mental Status: She is alert.  Psychiatric:        Mood and Affect: Mood normal. Mood is not anxious.    ED  Results / Procedures / Treatments   Labs (all labs ordered are listed, but only abnormal results are displayed) Labs Reviewed - No data to display  EKG None  Radiology No results found.  Procedures Procedures    Medications Ordered in ED Medications  acetaminophen (TYLENOL) tablet 650 mg (has no administration in time range)    ED Course/ Medical Decision Making/ A&P                           Medical Decision Making   Elsye Morosky is a 76 y.o. female with a past medical history significant for hypertension, diabetes, GERD, prior stroke, previous NSTEMI, and previous fall 4 months ago with nasal fractures who presents with persistent intermittent headaches and bilateral knee pains.  According to patient, she has not had any new falls but continues to have waxing waning headaches.  She reports she was given some Tylenol on arrival with resolution of the headache.  She says that she has had musculoskeletal pains on and off for quite some time with both of her knees but denies any new trauma.  She reports she still able to walk but they are sore.  She presents because she would like to get some knee sleeves for knee support.  She denies any new numbness, tingling, weakness.  She denies any fevers, chills, ingestion, cough, nausea, vomiting, constipation, diarrhea, or urinary symptoms.  Denies any arm  symptoms.  Otherwise says she is feeling better now after resting in the emergency department for over 9 hours waiting to be seen.  On my exam, chest is nontender.  Abdomen is nontender, back is nontender, neck is nontender.  No focal neurologic deficits initially.  Patient has some knee tenderness bilaterally but normal knee range of motion.  She reports he is able to ambulate.  Distally she had intact sensation, strength, and pulses.  She had her ankles and shins wrapped with Ace wraps.  Exam otherwise unremarkable.  Pupils symmetric and reactive normal extraocular movements.  Clear speech.  Had a shared decision conversation with patient.  Given her headaches already resolving after some Tylenol and her head injury being over 4 months ago, low suspicion for new intracranial hemorrhage.  Do not feel she needs head imaging at this time given her reassuring exam and reassuring vital signs.  Patient also has no new knee injuries and her knee pain is mild to moderate.  She reports it is chronic.  We agreed to hold on new knee imaging.  We will try to give the patient a knee sleeve to help with the support she was requesting and have her follow-up with her PCP.  Patient be discharged for outpatient follow-up.           Final Clinical Impression(s) / ED Diagnoses Final diagnoses:  Chronic nonintractable headache, unspecified headache type  Chronic pain of both knees    Rx / DC Orders ED Discharge Orders     None       Clinical Impression: 1. Chronic nonintractable headache, unspecified headache type   2. Chronic pain of both knees     Disposition: Discharge  Condition: Good  I have discussed the results, Dx and Tx plan with the pt(& family if present). He/she/they expressed understanding and agree(s) with the plan. Discharge instructions discussed at great length. Strict return precautions discussed and pt &/or family have verbalized understanding of the instructions. No further  questions at time of discharge.  New Prescriptions   No medications on file    Follow Up: [facility] [address] [place] [address] [phone] Schedule an appointment as soon as possible for a visit    [facility] EMERGENCY DEPARTMENT [address] [id number] mc [place] [address] [phone]        Maresha Anastos, Canary Brim, MD 03/08/21 [phone]
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
## 2021-03-09 ENCOUNTER — Emergency Department (HOSPITAL_COMMUNITY)
Admission: EM | Admit: 2021-03-09 | Discharge: 2021-03-10 | Disposition: A | Discharge status: Home / Self Care | Payer: Medicare Other | Attending: Emergency Medicine | Admitting: Emergency Medicine

## 2021-03-09 DIAGNOSIS — R52 Pain, unspecified: Secondary | ICD-10-CM

## 2021-03-09 DIAGNOSIS — G8929 Other chronic pain: Secondary | ICD-10-CM

## 2021-03-09 DIAGNOSIS — M791 Myalgia, unspecified site: Secondary | ICD-10-CM | POA: Insufficient documentation

## 2021-03-09 DIAGNOSIS — R519 Headache, unspecified: Secondary | ICD-10-CM | POA: Diagnosis not present

## 2021-03-09 DIAGNOSIS — I2510 Atherosclerotic heart disease of native coronary artery without angina pectoris: Secondary | ICD-10-CM | POA: Diagnosis not present

## 2021-03-09 DIAGNOSIS — D649 Anemia, unspecified: Secondary | ICD-10-CM | POA: Insufficient documentation

## 2021-03-09 DIAGNOSIS — Z79899 Other long term (current) drug therapy: Secondary | ICD-10-CM | POA: Diagnosis not present

## 2021-03-09 DIAGNOSIS — E119 Type 2 diabetes mellitus without complications: Secondary | ICD-10-CM | POA: Insufficient documentation

## 2021-03-09 DIAGNOSIS — Z7984 Long term (current) use of oral hypoglycemic drugs: Secondary | ICD-10-CM | POA: Insufficient documentation

## 2021-03-09 DIAGNOSIS — I10 Essential (primary) hypertension: Secondary | ICD-10-CM | POA: Diagnosis not present

## 2021-03-09 DIAGNOSIS — N289 Disorder of kidney and ureter, unspecified: Secondary | ICD-10-CM | POA: Insufficient documentation

## 2021-03-09 DIAGNOSIS — Z7902 Long term (current) use of antithrombotics/antiplatelets: Secondary | ICD-10-CM | POA: Insufficient documentation

## 2021-03-09 DIAGNOSIS — Z7982 Long term (current) use of aspirin: Secondary | ICD-10-CM | POA: Insufficient documentation

## 2021-03-09 LAB — CBC WITH DIFFERENTIAL/PLATELET
Abs Immature Granulocytes: 0.02 10*3/uL (ref 0.00–0.07)
Basophils Absolute: 0 10*3/uL (ref 0.0–0.1)
Basophils Relative: 1 %
Eosinophils Absolute: 0.1 10*3/uL (ref 0.0–0.5)
Eosinophils Relative: 1 %
HCT: 31.5 % — ABNORMAL LOW (ref 36.0–46.0)
Hemoglobin: 10.3 g/dL — ABNORMAL LOW (ref 12.0–15.0)
Immature Granulocytes: 0 %
Lymphocytes Relative: 22 %
Lymphs Abs: 1.7 10*3/uL (ref 0.7–4.0)
MCH: 29.9 pg (ref 26.0–34.0)
MCHC: 32.7 g/dL (ref 30.0–36.0)
MCV: 91.6 fL (ref 80.0–100.0)
Monocytes Absolute: 0.5 10*3/uL (ref 0.1–1.0)
Monocytes Relative: 6 %
Neutro Abs: 5.3 10*3/uL (ref 1.7–7.7)
Neutrophils Relative %: 70 %
Platelets: 233 10*3/uL (ref 150–400)
RBC: 3.44 MIL/uL — ABNORMAL LOW (ref 3.87–5.11)
RDW: 13.2 % (ref 11.5–15.5)
WBC: 7.6 10*3/uL (ref 4.0–10.5)
nRBC: 0 % (ref 0.0–0.2)

## 2021-03-09 LAB — COMPREHENSIVE METABOLIC PANEL
ALT: 12 U/L (ref 0–44)
AST: 19 U/L (ref 15–41)
Albumin: 3.7 g/dL (ref 3.5–5.0)
Alkaline Phosphatase: 75 U/L (ref 38–126)
Anion gap: 11 (ref 5–15)
BUN: 15 mg/dL (ref 8–23)
CO2: 27 mmol/L (ref 22–32)
Calcium: 8.6 mg/dL — ABNORMAL LOW (ref 8.9–10.3)
Chloride: 99 mmol/L (ref 98–111)
Creatinine, Ser: 1.38 mg/dL — ABNORMAL HIGH (ref 0.44–1.00)
GFR, Estimated: 40 mL/min — ABNORMAL LOW (ref >60)
Glucose, Bld: 139 mg/dL — ABNORMAL HIGH (ref 70–99)
Potassium: 3.7 mmol/L (ref 3.5–5.1)
Sodium: 137 mmol/L (ref 135–145)
Total Bilirubin: 0.1 mg/dL — ABNORMAL LOW (ref 0.3–1.2)
Total Protein: 7.6 g/dL (ref 6.5–8.1)

## 2021-03-09 LAB — URINALYSIS, ROUTINE W REFLEX MICROSCOPIC
Bilirubin Urine: NEGATIVE
Glucose, UA: NEGATIVE mg/dL
Hgb urine dipstick: NEGATIVE
Ketones, ur: NEGATIVE mg/dL
Nitrite: NEGATIVE
Protein, ur: NEGATIVE mg/dL
Specific Gravity, Urine: 1.014 (ref 1.005–1.030)
pH: 6 (ref 5.0–8.0)

## 2021-03-09 NOTE — ED Provider Triage Note (Signed)
Emergency Medicine Provider Triage Evaluation Note  [name] , a 76 y.o. female  was evaluated in triage.  Pt complains of headache. States that same has been ongoing for over a week. Was seen here yesterday for same. States that it is similar to when she was here yesterday, however is concerned that it is likely related to her high blood pressure. Takes her bp meds as prescribed without any missed doses. Reports chronic blurry vision but no acute changes  Review of Systems  Positive: Headache Negative: Fevers, chills  Physical Exam  BP (!) 160/57 (BP Location: Right Arm)    Pulse 74    Temp 98.7 F (37.1 C) (Oral)    Resp 16    SpO2 100%  Gen:   Awake, no distress   Resp:  Normal effort  MSK:   Moves extremities without difficulty  Other:  Alert and oriented and neurologically intact  Medical Decision Making  Medically screening exam initiated at 7:12 PM.  Appropriate orders placed.  [name] was informed that the remainder of the evaluation will be completed by another provider, this initial triage assessment does not replace that evaluation, and the importance of remaining in the ED until their evaluation is complete.     Nestor Lewandowsky 03/09/21 1916

## 2021-03-09 NOTE — ED Triage Notes (Signed)
Pt c/o HA x "awhile," states she feels this way when BP elevated. Endorses "vision problems," but states she has appt w ophthalmologist. Denies associated N/V, states no issue w home meds. Advises she "feels bad all over," unwilling to give more detail.

## 2021-03-10 ENCOUNTER — Emergency Department (HOSPITAL_COMMUNITY)
Admission: EM | Admit: 2021-03-10 | Discharge: 2021-03-10 | Disposition: A | Discharge status: Home / Self Care | Payer: Medicare Other | Source: Home / Self Care | Attending: Emergency Medicine | Admitting: Emergency Medicine

## 2021-03-10 ENCOUNTER — Other Ambulatory Visit: Payer: Self-pay

## 2021-03-10 ENCOUNTER — Encounter (HOSPITAL_COMMUNITY): Payer: Self-pay

## 2021-03-10 ENCOUNTER — Emergency Department (HOSPITAL_COMMUNITY): Payer: Medicare Other

## 2021-03-10 DIAGNOSIS — Z7982 Long term (current) use of aspirin: Secondary | ICD-10-CM | POA: Insufficient documentation

## 2021-03-10 DIAGNOSIS — R519 Headache, unspecified: Secondary | ICD-10-CM | POA: Insufficient documentation

## 2021-03-10 DIAGNOSIS — M79606 Pain in leg, unspecified: Secondary | ICD-10-CM | POA: Insufficient documentation

## 2021-03-10 DIAGNOSIS — G8929 Other chronic pain: Secondary | ICD-10-CM | POA: Insufficient documentation

## 2021-03-10 LAB — CBC WITH DIFFERENTIAL/PLATELET
Abs Immature Granulocytes: 0 10*3/uL (ref 0.00–0.07)
Basophils Absolute: 0 10*3/uL (ref 0.0–0.1)
Basophils Relative: 0 %
Eosinophils Absolute: 0 10*3/uL (ref 0.0–0.5)
Eosinophils Relative: 0 %
HCT: 32.4 % — ABNORMAL LOW (ref 36.0–46.0)
Hemoglobin: 10.7 g/dL — ABNORMAL LOW (ref 12.0–15.0)
Immature Granulocytes: 0 %
Lymphocytes Relative: 8 %
Lymphs Abs: 0.5 10*3/uL — ABNORMAL LOW (ref 0.7–4.0)
MCH: 30.1 pg (ref 26.0–34.0)
MCHC: 33 g/dL (ref 30.0–36.0)
MCV: 91 fL (ref 80.0–100.0)
Monocytes Absolute: 0 10*3/uL — ABNORMAL LOW (ref 0.1–1.0)
Monocytes Relative: 1 %
Neutro Abs: 5.7 10*3/uL (ref 1.7–7.7)
Neutrophils Relative %: 91 %
Platelets: 237 10*3/uL (ref 150–400)
RBC: 3.56 MIL/uL — ABNORMAL LOW (ref 3.87–5.11)
RDW: 13.2 % (ref 11.5–15.5)
WBC: 6.3 10*3/uL (ref 4.0–10.5)
nRBC: 0 % (ref 0.0–0.2)

## 2021-03-10 LAB — BASIC METABOLIC PANEL
Anion gap: 10 (ref 5–15)
BUN: 20 mg/dL (ref 8–23)
CO2: 26 mmol/L (ref 22–32)
Calcium: 8.8 mg/dL — ABNORMAL LOW (ref 8.9–10.3)
Chloride: 100 mmol/L (ref 98–111)
Creatinine, Ser: 1.21 mg/dL — ABNORMAL HIGH (ref 0.44–1.00)
GFR, Estimated: 47 mL/min — ABNORMAL LOW (ref >60)
Glucose, Bld: 155 mg/dL — ABNORMAL HIGH (ref 70–99)
Potassium: 4.1 mmol/L (ref 3.5–5.1)
Sodium: 136 mmol/L (ref 135–145)

## 2021-03-10 LAB — SEDIMENTATION RATE: Sed Rate: 40 mm/hr — ABNORMAL HIGH (ref 0–22)

## 2021-03-10 MED ORDER — PREDNISONE 20 MG PO TABS
60.0000 mg | ORAL_TABLET | Freq: Once | ORAL | Status: AC
  Administered 2021-03-10: 60 mg via ORAL
  Filled 2021-03-10: qty 3

## 2021-03-10 MED ORDER — PREDNISONE 50 MG PO TABS: 50.0000 mg | ORAL_TABLET | Freq: Every day | ORAL | 0 refills | Status: DC

## 2021-03-10 NOTE — ED Triage Notes (Signed)
Pt arrived via POV, c/o head ache and bilateral leg/foot pain.

## 2021-03-10 NOTE — Discharge Instructions (Addendum)
You may take acetaminophen as needed for headache or body aches.  Do not take ibuprofen or naproxen because they can hurt your kidney.  You are being given a trial of prednisone to see if it helps.  If your aching gets significantly better, you may need to be on a longer course of prednisone.

## 2021-03-10 NOTE — ED Provider Notes (Signed)
[facility] EMERGENCY DEPARTMENT Provider Note   CSN: [id number] Arrival date & time: 03/09/21  1849     History  Chief Complaint  Patient presents with   Headache   Generalized Body Aches         [name] is a 76 y.o. female.  The history is provided by the patient.  Headache She has history of hypertension, diabetes, stroke, coronary artery disease and comes in complaining of intermittent headache and generalized body aches.  She states that she has had the body aches for about a week.  She thinks her headache will only occur when her blood pressure goes up.  She has been taking acetaminophen with slight relief.  She has been having some decrease in her vision and is scheduled to see her eye doctor.  She is requesting a prescription for diabetic shoes, but she is advised that I cannot give her that prescription.  She denies any weakness, numbness, tingling.  She denies any nausea or vomiting.   Home Medications Prior to Admission medications   Medication Sig Start Date End Date Taking? Authorizing Provider  albuterol (VENTOLIN HFA) 108 (90 Base) MCG/ACT inhaler Inhale 1-2 puffs into the lungs every 6 (six) hours as needed for wheezing or shortness of breath. 04/22/20   Barbette Merino, NP  aspirin EC 81 MG tablet Take 81 mg by mouth daily. Swallow whole.    [provider]  chlorthalidone (HYGROTON) 25 MG tablet Take 1 tablet (25 mg total) by mouth daily. 10/05/20 10/05/21  Barbette Merino, NP  clopidogrel (PLAVIX) 75 MG tablet Take 1 tablet (75 mg total) by mouth daily. 04/22/20   Barbette Merino, NP  diclofenac Sodium (VOLTAREN) 1 % GEL Apply 2 g topically 4 (four) times daily as needed. 07/27/20   Tilden Fossa, MD  diphenhydramine-acetaminophen (TYLENOL PM) 25-500 MG TABS tablet Take 0.5 tablets by mouth at bedtime as needed. 1/2 tablet    [provider]  gabapentin (NEURONTIN) 300 MG capsule Take 1 capsule (300 mg total) by mouth 3 (three)  times daily. Patient taking differently: Take 300 mg by mouth daily. 10/05/20   Barbette Merino, NP  gatifloxacin (ZYMAXID) 0.5 % SOLN Place 1 drop into the right eye 4 (four) times daily. 06/28/20   [provider]  glipiZIDE (GLUCOTROL) 5 MG tablet Take 1 tablet (5 mg total) by mouth 2 (two) times daily before a meal. 04/22/20 04/22/21  Barbette Merino, NP  hydrALAZINE (APRESOLINE) 50 MG tablet Take 1 tablet (50 mg total) by mouth 2 (two) times daily. Patient taking differently: Take 50 mg by mouth daily. 04/22/20   Barbette Merino, NP  ketorolac (ACULAR) 0.5 % ophthalmic solution Place 1 drop into the right eye 4 (four) times daily. Patient not taking: Reported on 02/01/2021 06/28/20   [provider]  lidocaine (LIDODERM) 5 % Place 1 patch onto the skin daily. Remove & Discard patch within 12 hours or as directed by MD 01/10/21   Arthor Captain, PA-C  losartan (COZAAR) 50 MG tablet Take 1 tablet (50 mg total) by mouth daily. 10/05/20 10/05/21  Barbette Merino, NP  metFORMIN (GLUCOPHAGE) 500 MG tablet Take 2 tablets (1,000 mg total) by mouth 2 (two) times daily with a meal. Patient taking differently: Take 1,000 mg by mouth daily with breakfast. 10/05/20 10/05/21  Barbette Merino, NP  mirabegron ER (MYRBETRIQ) 25 MG TB24 tablet Take 25 mg by mouth daily.    [provider]  naproxen (NAPROSYN) 250 MG tablet Take 1 tablet (250 mg total) by mouth 2 (two) times daily with a meal. 02/05/21   Linwood Dibbles, MD  [facility] VERIO test strip Use 1-4 times daily as directed/needed   DX E11.9 Patient not taking: No sig reported 04/25/20   Barbette Merino, NP  potassium chloride (KLOR-CON M) 20 MEQ tablet Take 0.5 tablets (10 mEq total) by mouth daily. 02/25/21   Dione Booze, MD  prednisoLONE acetate (PRED FORTE) 1 % ophthalmic suspension Place 1 drop into the right eye 4 (four) times daily. Patient not taking: Reported on 02/01/2021 06/28/20   [provider]  rosuvastatin (CRESTOR) 10 MG tablet  Take 1 tablet (10 mg total) by mouth at bedtime. 10/05/20 10/05/21  Barbette Merino, NP      Allergies    Codeine, Ibuprofen, and Imdur [isosorbide nitrate]    Review of Systems   Review of Systems  Neurological:  Positive for headaches.  All other systems reviewed and are negative.  Physical Exam Updated Vital Signs BP (!) 159/54    Pulse 60    Temp 98.7 F (37.1 C) (Oral)    Resp 18    SpO2 100%  Physical Exam Vitals and nursing note reviewed.  76 year old female, resting comfortably and in no acute distress. Vital signs are significant for elevated blood pressure. Oxygen saturation is 100%, which is normal. Head is normocephalic and atraumatic. PERRLA, EOMI. Oropharynx is clear.  There is mild tenderness over the temporal arteries bilaterally. Neck is nontender and supple without adenopathy or JVD. Back is nontender and there is no CVA tenderness. Lungs are clear without rales, wheezes, or rhonchi. Chest is nontender. Heart has regular rate and rhythm without murmur. Abdomen is soft, flat, nontender. Extremities have no cyanosis or edema, full range of motion is present. Skin is warm and dry without rash. Neurologic: Mental status is normal, cranial nerves are intact, strength is 5/5 in all 4 extremities.  ED Results / Procedures / Treatments   Labs (all labs ordered are listed, but only abnormal results are displayed) Labs Reviewed  CBC WITH DIFFERENTIAL/PLATELET - Abnormal; Notable for the following components:      Result Value   RBC 3.44 (*)    Hemoglobin 10.3 (*)    HCT 31.5 (*)    All other components within normal limits  COMPREHENSIVE METABOLIC PANEL - Abnormal; Notable for the following components:   Glucose, Bld 139 (*)    Creatinine, Ser 1.38 (*)    Calcium 8.6 (*)    Total Bilirubin 0.1 (*)    GFR, Estimated 40 (*)    All other components within normal limits  URINALYSIS, ROUTINE W REFLEX MICROSCOPIC - Abnormal; Notable for the following components:    Leukocytes,Ua MODERATE (*)    Bacteria, UA RARE (*)    All other components within normal limits   Procedures Procedures    Medications Ordered in ED Medications  predniSONE (DELTASONE) tablet 60 mg (has no administration in time range)    ED Course/ Medical Decision Making/ A&P                           Medical Decision Making Amount and/or Complexity of Data Reviewed Labs: ordered.  Risk Prescription drug management.   Headache and body aches concerning for possible polymyalgia rheumatica.  Old records are reviewed, she does have known arthritis which may account for her body aches.  No red flags  to suggest more serious pathology such as meningitis, subarachnoid hemorrhage, intracranial tumor.  She also has several recent ED visits for nonspecific headache.  CT scan of head on 02/22/2021 showed no acute process.  Labs show stable anemia, stable renal insufficiency.  We will check sedimentation rate to rule out polymyalgia rheumatica.  Sedimentation rate has come back elevated at 40, but this is actually lower than it had been in the past.  Result is felt to be indeterminate.  I will give her a trial of a short course of steroids to see if she gets improvement.  If she does improve with steroids, she probably should remain on steroids for 3-6 months.  She states that she does not have a primary care provider, given information about [place] referral program.        Final Clinical Impression(s) / ED Diagnoses Final diagnoses:  Chronic nonintractable headache, unspecified headache type  Body aches  Renal insufficiency  Normochromic normocytic anemia    Rx / DC Orders ED Discharge Orders          Ordered    predniSONE (DELTASONE) 50 MG tablet  Daily        03/10/21 0436              Dione Booze, MD 03/10/21 [phone]

## 2021-03-10 NOTE — ED Provider Triage Note (Signed)
Emergency Medicine Provider Triage Evaluation Note  [name] , a 76 y.o. female  was evaluated in triage.  Pt complains of headache and bodyaches that have been going on "for awhile". She has been evaluated numerous times for the same compalint. She was seen early this morning and given prednisone.   Review of Systems  Positive: Headache, bodyaches Negative: fever  Physical Exam  There were no vitals taken for this visit. Gen:   Awake, no distress   Resp:  Normal effort  MSK:   Moves extremities without difficulty  Other:  AAOx4, no focal deficits  Medical Decision Making  Medically screening exam initiated at 4:28 PM.  Appropriate orders placed.  [name] was informed that the remainder of the evaluation will be completed by another provider, this initial triage assessment does not replace that evaluation, and the importance of remaining in the ED until their evaluation is complete.  Repeat CT head Routine labs COVID test   Mannie Stabile, PA-C 03/10/21 1634

## 2021-03-10 NOTE — Discharge Instructions (Signed)

## 2021-03-10 NOTE — ED Notes (Signed)
Refused to sign Discharge papers

## 2021-03-10 NOTE — ED Notes (Signed)
Pt refused covid swab. States she "does not have that covid."

## 2021-03-10 NOTE — ED Provider Notes (Signed)
[facility]-EMERGENCY DEPT Provider Note   CSN: [id number] Arrival date & time: 03/10/21  1525     History  Chief Complaint  Patient presents with   Headache   Leg Pain    Brylynn Boehringer is a 76 y.o. female who presents emergency department with chief complaint of chronic headache and leg pain.  Patient states that earlier today she knows her blood pressure was high so she came to the emergency department.  The patient is well-known to the emergency department.  I personally evaluated this patient with the same complaints repeatedly.  She has a chronic intractable headache and chronic intractable leg pain.  She does not generally take any medications at home for it and does not currently have a primary care doctor after the [facility] on [address]  Patient states that she got some medication earlier while she was waiting for evaluation and her headache feels much better.  She has no complaints at this time.  She is normotensive.  She denies fevers or chills.  She was seen for the same thing last night.  Triage work-up evaluated shows baseline labs and a head CT which is negative for acute findings.   Headache Leg Pain     Home Medications Prior to Admission medications   Medication Sig Start Date End Date Taking? Authorizing Provider  albuterol (VENTOLIN HFA) 108 (90 Base) MCG/ACT inhaler Inhale 1-2 puffs into the lungs every 6 (six) hours as needed for wheezing or shortness of breath. 04/22/20   Barbette Merino, NP  aspirin EC 81 MG tablet Take 81 mg by mouth daily. Swallow whole.    [provider]  chlorthalidone (HYGROTON) 25 MG tablet Take 1 tablet (25 mg total) by mouth daily. 10/05/20 10/05/21  Barbette Merino, NP  clopidogrel (PLAVIX) 75 MG tablet Take 1 tablet (75 mg total) by mouth daily. 04/22/20   Barbette Merino, NP  diclofenac Sodium (VOLTAREN) 1 % GEL Apply 2 g topically 4 (four) times daily as needed. 07/27/20   Tilden Fossa, MD  diphenhydramine-acetaminophen (TYLENOL PM) 25-500 MG TABS tablet Take 0.5 tablets by mouth at bedtime as needed. 1/2 tablet    [provider]  gabapentin (NEURONTIN) 300 MG capsule Take 1 capsule (300 mg total) by mouth 3 (three) times daily. Patient taking differently: Take 300 mg by mouth daily. 10/05/20   Barbette Merino, NP  gatifloxacin (ZYMAXID) 0.5 % SOLN Place 1 drop into the right eye 4 (four) times daily. 06/28/20   [provider]  glipiZIDE (GLUCOTROL) 5 MG tablet Take 1 tablet (5 mg total) by mouth 2 (two) times daily before a meal. 04/22/20 04/22/21  Barbette Merino, NP  hydrALAZINE (APRESOLINE) 50 MG tablet Take 1 tablet (50 mg total) by mouth 2 (two) times daily. Patient taking differently: Take 50 mg by mouth daily. 04/22/20   Barbette Merino, NP  ketorolac (ACULAR) 0.5 % ophthalmic solution Place 1 drop into the right eye 4 (four) times daily. Patient not taking: Reported on 02/01/2021 06/28/20   [provider]  lidocaine (LIDODERM) 5 % Place 1 patch onto the skin daily. Remove & Discard patch within 12 hours or as directed by MD 01/10/21   Arthor Captain, PA-C  losartan (COZAAR) 50 MG tablet Take 1 tablet (50 mg total) by mouth daily. 10/05/20 10/05/21  Barbette Merino, NP  metFORMIN (GLUCOPHAGE) 500 MG tablet Take 2 tablets (1,000 mg total) by mouth 2 (  two) times daily with a meal. Patient taking differently: Take 1,000 mg by mouth daily with breakfast. 10/05/20 10/05/21  Barbette Merino, NP  mirabegron ER (MYRBETRIQ) 25 MG TB24 tablet Take 25 mg by mouth daily.    [provider]  naproxen (NAPROSYN) 250 MG tablet Take 1 tablet (250 mg total) by mouth 2 (two) times daily with a meal. 02/05/21   Linwood Dibbles, MD  [facility] VERIO test strip Use 1-4 times daily as directed/needed   DX E11.9 Patient not taking: No sig reported 04/25/20   Barbette Merino, NP  potassium chloride (KLOR-CON M) 20 MEQ tablet Take 0.5 tablets (10 mEq total) by mouth daily.  02/25/21   Dione Booze, MD  prednisoLONE acetate (PRED FORTE) 1 % ophthalmic suspension Place 1 drop into the right eye 4 (four) times daily. Patient not taking: Reported on 02/01/2021 06/28/20   [provider]  predniSONE (DELTASONE) 50 MG tablet Take 1 tablet (50 mg total) by mouth daily. 03/10/21   Dione Booze, MD  rosuvastatin (CRESTOR) 10 MG tablet Take 1 tablet (10 mg total) by mouth at bedtime. 10/05/20 10/05/21  Barbette Merino, NP      Allergies    Codeine, Ibuprofen, and Imdur [isosorbide nitrate]    Review of Systems   Review of Systems  Neurological:  Positive for headaches.   Physical Exam Updated Vital Signs BP (!) 144/74 (BP Location: Left Arm)    Pulse 72    Temp 98.3 F (36.8 C) (Oral)    Resp 16    SpO2 99%  Physical Exam Physical Exam  Constitutional: Pt is oriented to person, place, and time. Pt appears well-developed and well-nourished. No distress.  HENT:  Head: Normocephalic and atraumatic.  Mouth/Throat: Oropharynx is clear and moist.  Eyes: Conjunctivae and EOM are normal. Pupils are equal, round, and reactive to light. No scleral icterus.  No horizontal, vertical or rotational nystagmus  Neck: Normal range of motion. Neck supple.  Full active and passive ROM without pain No midline or paraspinal tenderness No nuchal rigidity or meningeal signs  Cardiovascular: Normal rate, regular rhythm and intact distal pulses.   Pulmonary/Chest: Effort normal and breath sounds normal. No respiratory distress. Pt has no wheezes. No rales.  Abdominal: Soft. Bowel sounds are normal. There is no tenderness. There is no rebound and no guarding.  Musculoskeletal: Normal range of motion.  Lymphadenopathy:    No cervical adenopathy.  Neurological: Pt. is alert and oriented to person, place, and time. He has normal reflexes. No cranial nerve deficit.  Exhibits normal muscle tone. Coordination normal.  Mental Status: A&Ox4 Speech is clear and goal oriented, follows  commands Major Cranial nerves without deficit, no facial droop Normal strength in upper and lower extremities bilaterally including dorsiflexion and plantar flexion, strong and equal grip strength Sensation normal to light and sharp touch Moves extremities without ataxia, coordination intact Normal finger to nose and rapid alternating movements Neg romberg, no pronator drift Normal gait Normal heel-shin and balance Skin: Skin is warm and dry. No rash noted. Pt is not diaphoretic.  Psychiatric: Pt has a normal mood and affect. Behavior is normal. Judgment and thought content normal.  Nursing note and vitals reviewed.  ED Results / Procedures / Treatments   Labs (all labs ordered are listed, but only abnormal results are displayed) Labs Reviewed  CBC WITH DIFFERENTIAL/PLATELET - Abnormal; Notable for the following components:      Result Value   RBC 3.56 (*)  Hemoglobin 10.7 (*)    HCT 32.4 (*)    Lymphs Abs 0.5 (*)    Monocytes Absolute 0.0 (*)    All other components within normal limits  BASIC METABOLIC PANEL - Abnormal; Notable for the following components:   Glucose, Bld 155 (*)    Creatinine, Ser 1.21 (*)    Calcium 8.8 (*)    GFR, Estimated 47 (*)    All other components within normal limits  RESP PANEL BY RT-PCR (FLU A&B, COVID) [id number]    EKG None  Radiology CT Head Wo Contrast  Result Date: 03/10/2021 CLINICAL DATA:  Sudden onset severe headache for 1 week EXAM: CT HEAD WITHOUT CONTRAST TECHNIQUE: Contiguous axial images were obtained from the base of the skull through the vertex without intravenous contrast. RADIATION DOSE REDUCTION: This exam was performed according to the departmental dose-optimization program which includes automated exposure control, adjustment of the mA and/or kV according to patient size and/or use of iterative reconstruction technique. COMPARISON:  02/22/2021 FINDINGS: Brain: No acute infarct or hemorrhage. Lateral ventricles and midline  structures are unremarkable. No acute extra-axial fluid collections. No mass effect. Vascular: No hyperdense vessel or unexpected calcification. Skull: Normal. Negative for fracture or focal lesion. Sinuses/Orbits: No acute finding. Other: None. IMPRESSION: 1. Stable head CT, no acute process. Electronically Signed   By: Sharlet Salina M.D.   On: 03/10/2021 17:35    Procedures Procedures    Medications Ordered in ED Medications - No data to display  ED Course/ Medical Decision Making/ A&P Clinical Course as of 03/10/21 2233  Fri Mar 10, 2021  2231 CT Head Wo Contrast I viewed CT images which show no acute findings [AH]  2232 CBC with Differential(!) [AH]  2232 Basic metabolic panel(!) Labs at baseline [AH]    Clinical Course User Index [AH] Arthor Captain, PA-C                           Medical Decision Making [name] presents with headache Given the large differential diagnosis for [name], the decision making in this case is of high complexity.  After evaluating all of the data points in this case, the presentation of Mack Thurmon is NOT consistent with skull fracture, meningitis/encephalitis, SAH/sentinel bleed, Intracranial Hemorrhage (ICH) (subdural/epidural), acute obstructive hydrocephalus, space occupying lesions, CVA, CO Poisoning, Basilar/vertebral artery dissection, preeclampsia, cerebral venous thrombosis, hypertensive emergency, temporal Arteritis, Idiopathic Intracranial Hypertension (pseudotumor cerebri).  Strict return and follow-up precautions have been given by me personally or by detailed written instructions verbalized by nursing staff using the teach back method to patient/family/caregiver.  Data Reviewed/Counseling: I have reviewed the patient's vital signs, nursing notes, and other relevant tests/information. I had a detailed discussion regarding the historical points, exam findings, and any diagnostic results supporting the discharge diagnosis. I also  discussed the need for outpatient follow-up and the need to return to the ED if symptoms worsen or if there are any questions or concerns that arise at hom    Amount and/or Complexity of Data Reviewed Labs:  Decision-making details documented in ED Course. Radiology: independent interpretation performed. Decision-making details documented in ED Course.   Final Clinical Impression(s) / ED Diagnoses Final diagnoses:  Other chronic pain    Rx / DC Orders ED Discharge Orders     None         Debra, Colon, PA-C 03/10/21 2233    Milagros Loll, MD 03/11/21 0230

## 2021-03-10 NOTE — ED Notes (Signed)
Pt reports coming in for high blood pressure. Denies CP, SOB, and pain at this time.

## 2021-03-14 ENCOUNTER — Emergency Department (HOSPITAL_COMMUNITY)
Admission: EM | Admit: 2021-03-14 | Discharge: 2021-03-14 | Disposition: A | Discharge status: Home / Self Care | Payer: Medicare Other | Attending: Emergency Medicine | Admitting: Emergency Medicine

## 2021-03-14 DIAGNOSIS — Z79899 Other long term (current) drug therapy: Secondary | ICD-10-CM | POA: Diagnosis not present

## 2021-03-14 DIAGNOSIS — Z7984 Long term (current) use of oral hypoglycemic drugs: Secondary | ICD-10-CM | POA: Insufficient documentation

## 2021-03-14 DIAGNOSIS — E119 Type 2 diabetes mellitus without complications: Secondary | ICD-10-CM | POA: Diagnosis not present

## 2021-03-14 DIAGNOSIS — Z7982 Long term (current) use of aspirin: Secondary | ICD-10-CM | POA: Diagnosis not present

## 2021-03-14 DIAGNOSIS — I10 Essential (primary) hypertension: Secondary | ICD-10-CM | POA: Insufficient documentation

## 2021-03-14 DIAGNOSIS — Z7902 Long term (current) use of antithrombotics/antiplatelets: Secondary | ICD-10-CM | POA: Diagnosis not present

## 2021-03-14 DIAGNOSIS — R519 Headache, unspecified: Secondary | ICD-10-CM

## 2021-03-14 MED ORDER — ACETAMINOPHEN 325 MG PO TABS
650.0000 mg | ORAL_TABLET | Freq: Once | ORAL | Status: AC
  Administered 2021-03-14: 650 mg via ORAL
  Filled 2021-03-14: qty 2

## 2021-03-14 NOTE — ED Provider Notes (Signed)
[facility] EMERGENCY DEPARTMENT Provider Note   CSN: [id number] Arrival date & time: 03/14/21  1519     History  Chief Complaint  Patient presents with   Headache    [name] is a 76 y.o. female.   Headache Patient presents for headache.  Per chart review, she has been seen multiple times over the past week for similar complaint.  She has undergone multiple recent CT scans.  Patient describes vertex headache.  Severity is described as mild.  She denies any changes from previous headaches.  She denies any recent trauma.  She has not been taking any over-the-counter medication for relief of her headache.  She denies any other symptoms.    Home Medications Prior to Admission medications   Medication Sig Start Date End Date Taking? Authorizing Provider  albuterol (VENTOLIN HFA) 108 (90 Base) MCG/ACT inhaler Inhale 1-2 puffs into the lungs every 6 (six) hours as needed for wheezing or shortness of breath. 04/22/20   Vevelyn Francois, NP  aspirin EC 81 MG tablet Take 81 mg by mouth daily. Swallow whole.    [provider]  chlorthalidone (HYGROTON) 25 MG tablet Take 1 tablet (25 mg total) by mouth daily. 10/05/20 10/05/21  Vevelyn Francois, NP  clopidogrel (PLAVIX) 75 MG tablet Take 1 tablet (75 mg total) by mouth daily. 04/22/20   Vevelyn Francois, NP  diclofenac Sodium (VOLTAREN) 1 % GEL Apply 2 g topically 4 (four) times daily as needed. 07/27/20   Quintella Reichert, MD  diphenhydramine-acetaminophen (TYLENOL PM) 25-500 MG TABS tablet Take 0.5 tablets by mouth at bedtime as needed. 1/2 tablet    [provider]  gabapentin (NEURONTIN) 300 MG capsule Take 1 capsule (300 mg total) by mouth 3 (three) times daily. Patient taking differently: Take 300 mg by mouth daily. 10/05/20   Vevelyn Francois, NP  gatifloxacin (ZYMAXID) 0.5 % SOLN Place 1 drop into the right eye 4 (four) times daily. 06/28/20   [provider]  glipiZIDE (GLUCOTROL) 5 MG tablet Take 1  tablet (5 mg total) by mouth 2 (two) times daily before a meal. 04/22/20 04/22/21  Vevelyn Francois, NP  hydrALAZINE (APRESOLINE) 50 MG tablet Take 1 tablet (50 mg total) by mouth 2 (two) times daily. Patient taking differently: Take 50 mg by mouth daily. 04/22/20   Vevelyn Francois, NP  ketorolac (ACULAR) 0.5 % ophthalmic solution Place 1 drop into the right eye 4 (four) times daily. Patient not taking: Reported on 02/01/2021 06/28/20   [provider]  lidocaine (LIDODERM) 5 % Place 1 patch onto the skin daily. Remove & Discard patch within 12 hours or as directed by MD 01/10/21   Margarita Mail, PA-C  losartan (COZAAR) 50 MG tablet Take 1 tablet (50 mg total) by mouth daily. 10/05/20 10/05/21  Vevelyn Francois, NP  metFORMIN (GLUCOPHAGE) 500 MG tablet Take 2 tablets (1,000 mg total) by mouth 2 (two) times daily with a meal. Patient taking differently: Take 1,000 mg by mouth daily with breakfast. 10/05/20 10/05/21  Vevelyn Francois, NP  mirabegron ER (MYRBETRIQ) 25 MG TB24 tablet Take 25 mg by mouth daily.    [provider]  naproxen (NAPROSYN) 250 MG tablet Take 1 tablet (250 mg total) by mouth 2 (two) times daily with a meal. 02/05/21   Dorie Rank, MD  [facility] VERIO test strip Use 1-4 times daily as directed/needed   DX E11.9 Patient not taking: No sig reported 04/25/20   Edison Pace,  Crystal M, NP  potassium chloride (KLOR-CON M) 20 MEQ tablet Take 0.5 tablets (10 mEq total) by mouth daily. [id number]   Delora Fuel, MD  prednisoLONE acetate (PRED FORTE) 1 % ophthalmic suspension Place 1 drop into the right eye 4 (four) times daily. Patient not taking: Reported on 02/01/2021 06/28/20   [provider]  predniSONE (DELTASONE) 50 MG tablet Take 1 tablet (50 mg total) by mouth daily. [id number]   Delora Fuel, MD  rosuvastatin (CRESTOR) 10 MG tablet Take 1 tablet (10 mg total) by mouth at bedtime. 10/05/20 10/05/21  Vevelyn Francois, NP      Allergies    Codeine, Ibuprofen, and Imdur [isosorbide  nitrate]    Review of Systems   Review of Systems  Neurological:  Positive for headaches.  All other systems reviewed and are negative.  Physical Exam Updated Vital Signs BP (!) 173/69    Pulse (!) 59    Temp 98.5 F (36.9 C) (Temporal)    Resp 18    Ht 5\' 5"  (1.651 m)    Wt 64 kg    SpO2 98%    BMI 23.48 kg/m  Physical Exam Vitals and nursing note reviewed.  Constitutional:      General: She is not in acute distress.    Appearance: She is well-developed. She is not ill-appearing, toxic-appearing or diaphoretic.  HENT:     Head: Normocephalic and atraumatic.     Mouth/Throat:     Mouth: Mucous membranes are moist.     Pharynx: Oropharynx is clear.  Eyes:     Conjunctiva/sclera: Conjunctivae normal.     Pupils: Pupils are equal, round, and reactive to light.  Cardiovascular:     Rate and Rhythm: Normal rate and regular rhythm.     Heart sounds: No murmur heard. Pulmonary:     Effort: Pulmonary effort is normal. No respiratory distress.     Breath sounds: Normal breath sounds. No wheezing or rales.  Abdominal:     Palpations: Abdomen is soft.     Tenderness: There is no abdominal tenderness.  Musculoskeletal:        General: No swelling.     Cervical back: Normal range of motion and neck supple. No rigidity.  Skin:    General: Skin is warm and dry.     Capillary Refill: Capillary refill takes less than 2 seconds.  Neurological:     Mental Status: She is alert and oriented to person, place, and time.     Cranial Nerves: No cranial nerve deficit, dysarthria or facial asymmetry.     Sensory: No sensory deficit.     Motor: No weakness.     Coordination: Romberg sign negative. Coordination normal.     Gait: Gait normal.  Psychiatric:        Mood and Affect: Mood normal.        Speech: Speech normal.        Behavior: Behavior normal.    ED Results / Procedures / Treatments   Labs (all labs ordered are listed, but only abnormal results are displayed) Labs Reviewed - No  data to display  EKG None  Radiology No results found.  Procedures Procedures    Medications Ordered in ED Medications  acetaminophen (TYLENOL) tablet 650 mg (650 mg Oral Given 03/14/21 1620)    ED Course/ Medical Decision Making/ A&P  Medical Decision Making Risk OTC drugs.   76 year old female presenting for complaints of headache.  Medical history includes DM 2, GERD, HTN, stroke, depression.  Vital signs on arrival are notable for hypertension.  Patient is well-appearing on exam.  She describes her headache as mild and unchanged from prior headaches.  She has no focal neurologic deficits.  Per chart review, over the past week, she has had 3 prior visits for headache.  She underwent CT scan 4 days ago.  Given no change or worsening of symptoms, I do not feel that further imaging is indicated at this time.  Patient was agreeable to this and simply stated that she wanted some Tylenol.  Tylenol was ordered.  On reassessment, patient states that she would like to go home before gets dark.  She was advised to continue treating headache pain with over-the-counter medications as needed.  She was discharged in good condition.        Final Clinical Impression(s) / ED Diagnoses Final diagnoses:  Bad headache    Rx / DC Orders ED Discharge Orders     None         Godfrey Pick, MD 03/15/21 1356

## 2021-03-14 NOTE — Discharge Instructions (Signed)
Take Tylenol as needed for recurrence of headache.  Return to emergency department for new or worsening symptoms.

## 2021-03-14 NOTE — ED Triage Notes (Addendum)
Pt arrives POV for complaint of headache. Pt reports headache x 1 week, because "they got her on too much medicine" Reports HA usually associated w/ HTN. Denies vision changes, photophobia, phonophobia. Seen for similar complaint x 3 in last week.

## 2021-03-20 ENCOUNTER — Emergency Department (HOSPITAL_COMMUNITY)
Admission: EM | Admit: 2021-03-20 | Discharge: 2021-03-21 | Disposition: A | Discharge status: Home / Self Care | Payer: Medicare Other | Attending: Emergency Medicine | Admitting: Emergency Medicine

## 2021-03-20 ENCOUNTER — Emergency Department (HOSPITAL_COMMUNITY): Payer: Medicare Other

## 2021-03-20 ENCOUNTER — Other Ambulatory Visit: Payer: Self-pay

## 2021-03-20 DIAGNOSIS — M79672 Pain in left foot: Secondary | ICD-10-CM | POA: Diagnosis present

## 2021-03-20 DIAGNOSIS — Z7902 Long term (current) use of antithrombotics/antiplatelets: Secondary | ICD-10-CM | POA: Insufficient documentation

## 2021-03-20 DIAGNOSIS — I10 Essential (primary) hypertension: Secondary | ICD-10-CM | POA: Diagnosis not present

## 2021-03-20 DIAGNOSIS — E119 Type 2 diabetes mellitus without complications: Secondary | ICD-10-CM | POA: Diagnosis not present

## 2021-03-20 DIAGNOSIS — M722 Plantar fascial fibromatosis: Secondary | ICD-10-CM | POA: Diagnosis not present

## 2021-03-20 DIAGNOSIS — Z7984 Long term (current) use of oral hypoglycemic drugs: Secondary | ICD-10-CM | POA: Diagnosis not present

## 2021-03-20 DIAGNOSIS — Z7982 Long term (current) use of aspirin: Secondary | ICD-10-CM | POA: Insufficient documentation

## 2021-03-20 DIAGNOSIS — Z79899 Other long term (current) drug therapy: Secondary | ICD-10-CM | POA: Insufficient documentation

## 2021-03-20 NOTE — ED Triage Notes (Signed)
Pt c/o L foot pain x 1 week. Ambulatory.

## 2021-03-20 NOTE — ED Provider Triage Note (Signed)
Emergency Medicine Provider Triage Evaluation Note  Bilan Tedesco , a 76 y.o. female  was evaluated in triage.  Pt complains of left foot pain.  Denies injury/trauma/falls.  Ambulatory in triage.  Review of Systems  Positive: Foot pain Negative: fever  Physical Exam  BP (!) 131/58 (BP Location: Right Arm)    Pulse 78    Temp (!) 97.4 F (36.3 C) (Oral)    Resp 16    SpO2 100%   Gen:   Awake, no distress   Resp:  Normal effort  MSK:   Moves extremities without difficulty  Other:  Ambulatory in triage without difficulty  Medical Decision Making  Medically screening exam initiated at 10:57 PM.  Appropriate orders placed.  Gwendolyn Mclees was informed that the remainder of the evaluation will be completed by another provider, this initial triage assessment does not replace that evaluation, and the importance of remaining in the ED until their evaluation is complete.  Left foot pain.  Ambulatory in triage.  Denies injury.  Will order x-ray.   Garlon Hatchet, PA-C 03/20/21 2300

## 2021-03-21 DIAGNOSIS — M722 Plantar fascial fibromatosis: Secondary | ICD-10-CM | POA: Diagnosis not present

## 2021-03-21 LAB — CBG MONITORING, ED: Glucose-Capillary: 107 mg/dL — ABNORMAL HIGH (ref 70–99)

## 2021-03-21 MED ORDER — ACETAMINOPHEN 500 MG PO TABS
1000.0000 mg | ORAL_TABLET | Freq: Once | ORAL | Status: AC
  Administered 2021-03-21: 1000 mg via ORAL
  Filled 2021-03-21: qty 2

## 2021-03-21 NOTE — ED Notes (Signed)
Pt d/c home per MD order. Discharge summary reviewed, pt verbalizes understanding. Bus pass provided . No s/s of acute distress noted at discharge.

## 2021-03-21 NOTE — ED Provider Notes (Signed)
[facility] EMERGENCY DEPARTMENT Provider Note   CSN: [id number] Arrival date & time: 03/20/21  2251     History  Chief Complaint  Patient presents with   Foot Pain    [name] is a 76 y.o. female.  Pt is a 76 yo aa female with a hx of dm, htn, cva, and gerd.  She presents to the ED today with left foot pain.  Pt has been to the ED frequently over the last few months.  This is her 7th visit this month.  She said she does not have a pcp and has no transportation to get to her doctor appts.  She wants [place] to give her diabetic shoes.      Home Medications Prior to Admission medications   Medication Sig Start Date End Date Taking? Authorizing Provider  albuterol (VENTOLIN HFA) 108 (90 Base) MCG/ACT inhaler Inhale 1-2 puffs into the lungs every 6 (six) hours as needed for wheezing or shortness of breath. 04/22/20   Barbette Merino, NP  aspirin EC 81 MG tablet Take 81 mg by mouth daily. Swallow whole.    [provider]  chlorthalidone (HYGROTON) 25 MG tablet Take 1 tablet (25 mg total) by mouth daily. 10/05/20 10/05/21  Barbette Merino, NP  clopidogrel (PLAVIX) 75 MG tablet Take 1 tablet (75 mg total) by mouth daily. 04/22/20   Barbette Merino, NP  diclofenac Sodium (VOLTAREN) 1 % GEL Apply 2 g topically 4 (four) times daily as needed. 07/27/20   Tilden Fossa, MD  diphenhydramine-acetaminophen (TYLENOL PM) 25-500 MG TABS tablet Take 0.5 tablets by mouth at bedtime as needed. 1/2 tablet    [provider]  gabapentin (NEURONTIN) 300 MG capsule Take 1 capsule (300 mg total) by mouth 3 (three) times daily. Patient taking differently: Take 300 mg by mouth daily. 10/05/20   Barbette Merino, NP  gatifloxacin (ZYMAXID) 0.5 % SOLN Place 1 drop into the right eye 4 (four) times daily. 06/28/20   [provider]  glipiZIDE (GLUCOTROL) 5 MG tablet Take 1 tablet (5 mg total) by mouth 2 (two) times daily before a meal. 04/22/20 04/22/21  Barbette Merino, NP   hydrALAZINE (APRESOLINE) 50 MG tablet Take 1 tablet (50 mg total) by mouth 2 (two) times daily. Patient taking differently: Take 50 mg by mouth daily. 04/22/20   Barbette Merino, NP  ketorolac (ACULAR) 0.5 % ophthalmic solution Place 1 drop into the right eye 4 (four) times daily. Patient not taking: Reported on 02/01/2021 06/28/20   [provider]  lidocaine (LIDODERM) 5 % Place 1 patch onto the skin daily. Remove & Discard patch within 12 hours or as directed by MD 01/10/21   Arthor Captain, PA-C  losartan (COZAAR) 50 MG tablet Take 1 tablet (50 mg total) by mouth daily. 10/05/20 10/05/21  Barbette Merino, NP  metFORMIN (GLUCOPHAGE) 500 MG tablet Take 2 tablets (1,000 mg total) by mouth 2 (two) times daily with a meal. Patient taking differently: Take 1,000 mg by mouth daily with breakfast. 10/05/20 10/05/21  Barbette Merino, NP  mirabegron ER (MYRBETRIQ) 25 MG TB24 tablet Take 25 mg by mouth daily.    [provider]  naproxen (NAPROSYN) 250 MG tablet Take 1 tablet (250 mg total) by mouth 2 (two) times daily with a meal. 02/05/21   Linwood Dibbles, MD  [facility] VERIO test strip Use 1-4 times daily as directed/needed   DX E11.9 Patient not taking: No sig reported 04/25/20  Barbette Merino, NP  potassium chloride (KLOR-CON M) 20 MEQ tablet Take 0.5 tablets (10 mEq total) by mouth daily. 02/25/21   Dione Booze, MD  prednisoLONE acetate (PRED FORTE) 1 % ophthalmic suspension Place 1 drop into the right eye 4 (four) times daily. Patient not taking: Reported on 02/01/2021 06/28/20   [provider]  predniSONE (DELTASONE) 50 MG tablet Take 1 tablet (50 mg total) by mouth daily. 03/10/21   Dione Booze, MD  rosuvastatin (CRESTOR) 10 MG tablet Take 1 tablet (10 mg total) by mouth at bedtime. 10/05/20 10/05/21  Barbette Merino, NP      Allergies    Codeine, Ibuprofen, and Imdur [isosorbide nitrate]    Review of Systems   Review of Systems  Musculoskeletal:        Left foot pain  All other  systems reviewed and are negative.  Physical Exam Updated Vital Signs BP 136/71 (BP Location: Right Arm)    Pulse 73    Temp 98 F (36.7 C) (Oral)    Resp 17    SpO2 99%  Physical Exam Vitals and nursing note reviewed.  Constitutional:      Appearance: Normal appearance.  HENT:     Head: Normocephalic and atraumatic.     Right Ear: External ear normal.     Left Ear: External ear normal.     Nose: Nose normal.     Mouth/Throat:     Mouth: Mucous membranes are moist.     Pharynx: Oropharynx is clear.  Eyes:     Extraocular Movements: Extraocular movements intact.     Conjunctiva/sclera: Conjunctivae normal.     Pupils: Pupils are equal, round, and reactive to light.  Cardiovascular:     Rate and Rhythm: Normal rate and regular rhythm.     Pulses: Normal pulses.     Heart sounds: Normal heart sounds.  Pulmonary:     Effort: Pulmonary effort is normal.     Breath sounds: Normal breath sounds.  Abdominal:     General: Abdomen is flat. Bowel sounds are normal.     Palpations: Abdomen is soft.  Musculoskeletal:     Cervical back: Normal range of motion and neck supple.     Comments: + tenderness to the plantar fascia  Skin:    General: Skin is warm.     Capillary Refill: Capillary refill takes less than 2 seconds.  Neurological:     General: No focal deficit present.     Mental Status: She is alert.  Psychiatric:        Mood and Affect: Mood normal.    ED Results / Procedures / Treatments   Labs (all labs ordered are listed, but only abnormal results are displayed) Labs Reviewed  CBG MONITORING, ED - Abnormal; Notable for the following components:      Result Value   Glucose-Capillary 107 (*)    All other components within normal limits    EKG None  Radiology DG Foot Complete Left  Result Date: 03/20/2021 CLINICAL DATA:  Left foot pain. EXAM: LEFT FOOT - COMPLETE 3+ VIEW COMPARISON:  None. FINDINGS: There is no acute fracture or dislocation. The bones are  osteopenic. No arthritic changes. The soft tissues are unremarkable. IMPRESSION: Negative. Electronically Signed   By: Elgie Collard M.D.   On: 03/20/2021 23:34    Procedures Procedures    Medications Ordered in ED Medications  acetaminophen (TYLENOL) tablet 1,000 mg (has no administration in time range)    ED  Course/ Medical Decision Making/ A&P                           Medical Decision Making  This patient presents to the ED for concern of left foot pain, this involves an extensive number of treatment options, and is a complaint that carries with it a high risk of complications and morbidity.  The differential diagnosis includes fx, sprain, plantar fasciitis   Co morbidities that complicate the patient evaluation  dm   Additional history obtained:  Additional history obtained from epic chart review    Lab Tests:  I Ordered, and personally interpreted labs.  The pertinent results include:  cbg of 107   Imaging Studies ordered:  I ordered imaging studies including left foot x-ray  I independently visualized and interpreted imaging which was negative I agree with the radiologist interpretation   Consultations Obtained:  I requested consultation with the social work as pt is a frequent utilizer of the ED.  She made an appt with pt's pcp and the clinic sw will help pt with transportation  Problem List / ED Course:  Left foot pain:  pt has plantar fasciitis.  She is diabetic, so steroids are out for treatment.  Pt is d/c with rehab exercises and she is to f/u with podiatry.   Reevaluation:  After the interventions noted above, I reevaluated the patient and found that they have :stayed the same   Social Determinants of Health:  Difficulty with transportation   Dispostion:  After consideration of the diagnostic results and the patients response to treatment, I feel that the patent would benefit from discharge with outpatient f/u.          Final  Clinical Impression(s) / ED Diagnoses Final diagnoses:  Plantar fasciitis of left foot    Rx / DC Orders ED Discharge Orders     None         [name], MD 03/21/21 1029

## 2021-03-21 NOTE — Progress Notes (Signed)
°   03/21/21 1042  TOC ED Mini Assessment  TOC Time spent with patient (minutes): 30  PING Used in TOC Assessment No  Admission or Readmission Diverted Yes  Interventions which prevented an admission or readmission PCP Appointment Scheduled;Transportation Screening  What brought you to the Emergency Department?  Foot pain  Barriers to Discharge ED No Barriers  Means of departure Public Transportation  Patient states their goals for this hospitalization and ongoing recovery are: Get seen by a doctor for this foot pain   RNCM consulted regarding PCP appointment and transportation services.  RNCM met with pt at bedside to relay information about appointment and instructed pt to call insurance company for transportation to appointment.  Pt requests imbedded SW to help with setting up transportation.  RNCM notified Estanislado Emms, SW of request.  RNCM provided [employer] as transportation home today.

## 2021-03-22 DIAGNOSIS — Z79899 Other long term (current) drug therapy: Secondary | ICD-10-CM | POA: Diagnosis not present

## 2021-03-22 DIAGNOSIS — E1140 Type 2 diabetes mellitus with diabetic neuropathy, unspecified: Secondary | ICD-10-CM | POA: Diagnosis not present

## 2021-03-22 DIAGNOSIS — Z7984 Long term (current) use of oral hypoglycemic drugs: Secondary | ICD-10-CM | POA: Diagnosis not present

## 2021-03-22 DIAGNOSIS — Z7902 Long term (current) use of antithrombotics/antiplatelets: Secondary | ICD-10-CM | POA: Diagnosis not present

## 2021-03-22 DIAGNOSIS — R519 Headache, unspecified: Secondary | ICD-10-CM | POA: Diagnosis present

## 2021-03-22 DIAGNOSIS — Z7982 Long term (current) use of aspirin: Secondary | ICD-10-CM | POA: Diagnosis not present

## 2021-03-23 ENCOUNTER — Other Ambulatory Visit: Payer: Self-pay

## 2021-03-23 ENCOUNTER — Emergency Department (HOSPITAL_COMMUNITY)
Admission: EM | Admit: 2021-03-23 | Discharge: 2021-03-23 | Disposition: A | Discharge status: Home / Self Care | Payer: Medicare Other | Attending: Emergency Medicine | Admitting: Emergency Medicine

## 2021-03-23 ENCOUNTER — Encounter (HOSPITAL_COMMUNITY): Payer: Self-pay

## 2021-03-23 DIAGNOSIS — Z7902 Long term (current) use of antithrombotics/antiplatelets: Secondary | ICD-10-CM | POA: Insufficient documentation

## 2021-03-23 DIAGNOSIS — Z7982 Long term (current) use of aspirin: Secondary | ICD-10-CM | POA: Insufficient documentation

## 2021-03-23 DIAGNOSIS — R519 Headache, unspecified: Secondary | ICD-10-CM | POA: Insufficient documentation

## 2021-03-23 DIAGNOSIS — Z79899 Other long term (current) drug therapy: Secondary | ICD-10-CM | POA: Insufficient documentation

## 2021-03-23 DIAGNOSIS — I10 Essential (primary) hypertension: Secondary | ICD-10-CM | POA: Insufficient documentation

## 2021-03-23 DIAGNOSIS — G44209 Tension-type headache, unspecified, not intractable: Secondary | ICD-10-CM

## 2021-03-23 DIAGNOSIS — E1140 Type 2 diabetes mellitus with diabetic neuropathy, unspecified: Secondary | ICD-10-CM

## 2021-03-23 MED ORDER — METOCLOPRAMIDE HCL 10 MG PO TABS
10.0000 mg | ORAL_TABLET | Freq: Once | ORAL | Status: AC
Start: 2021-03-23 — End: 2021-03-23
  Administered 2021-03-23: 10 mg via ORAL
  Filled 2021-03-23: qty 1

## 2021-03-23 MED ORDER — ACETAMINOPHEN 500 MG PO TABS
1000.0000 mg | ORAL_TABLET | Freq: Once | ORAL | Status: AC
Start: 2021-03-23 — End: 2021-03-23
  Administered 2021-03-23: 1000 mg via ORAL
  Filled 2021-03-23: qty 2

## 2021-03-23 MED ORDER — ACETAMINOPHEN 500 MG PO TABS
1000.0000 mg | ORAL_TABLET | Freq: Once | ORAL | Status: AC
  Administered 2021-03-23: 1000 mg via ORAL
  Filled 2021-03-23: qty 2

## 2021-03-23 MED ORDER — SODIUM CHLORIDE 0.9 % IV BOLUS: 1000.0000 mL | Freq: Once | INTRAVENOUS | Status: DC

## 2021-03-23 MED ORDER — LIDOCAINE 5 % EX PTCH
1.0000 | MEDICATED_PATCH | CUTANEOUS | Status: DC
  Administered 2021-03-23: 1 via TRANSDERMAL
  Filled 2021-03-23: qty 1

## 2021-03-23 MED ORDER — DICLOFENAC SODIUM 1 % EX GEL: 2.0000 g | Freq: Four times a day (QID) | CUTANEOUS | 0 refills | Status: DC | PRN

## 2021-03-23 MED ORDER — DIPHENHYDRAMINE HCL 50 MG/ML IJ SOLN
25.0000 mg | Freq: Once | INTRAMUSCULAR | Status: DC
Start: 2021-03-23 — End: 2021-03-23

## 2021-03-23 MED ORDER — METOCLOPRAMIDE HCL 5 MG/ML IJ SOLN: 10.0000 mg | Freq: Once | INTRAMUSCULAR | Status: DC

## 2021-03-23 MED ORDER — METOCLOPRAMIDE HCL 10 MG PO TABS: 10.0000 mg | ORAL_TABLET | Freq: Four times a day (QID) | ORAL | 0 refills | Status: DC | PRN

## 2021-03-23 NOTE — ED Provider Notes (Signed)
?[facility] EMERGENCY DEPARTMENT ?Provider Note ? ? ?CSN: [id number] ?Arrival date & time: 03/22/21  2351 ? ?  ? ?History ? ?Chief Complaint  ?Patient presents with  ? Headache  ? ? ?[name] is a 76 y.o. female. ? ?Patient presents with recurrent left foot pain and headache.  Patient denies any new injuries.  Similar to when she was seen in the ER on 27 February.  No fevers or infectious symptoms.  Headache similar to previous gradual onset no significant headache currently.  No neurologic signs or symptoms. ? ? ?  ? ?Home Medications ?Prior to Admission medications   ?Medication Sig Start Date End Date Taking? Authorizing Provider  ?albuterol (VENTOLIN HFA) 108 (90 Base) MCG/ACT inhaler Inhale 1-2 puffs into the lungs every 6 (six) hours as needed for wheezing or shortness of breath. 04/22/20   Barbette Merino, NP  ?aspirin EC 81 MG tablet Take 81 mg by mouth daily. Swallow whole.    [provider]  ?chlorthalidone (HYGROTON) 25 MG tablet Take 1 tablet (25 mg total) by mouth daily. 10/05/20 10/05/21  Barbette Merino, NP  ?clopidogrel (PLAVIX) 75 MG tablet Take 1 tablet (75 mg total) by mouth daily. 04/22/20   Barbette Merino, NP  ?diclofenac Sodium (VOLTAREN) 1 % GEL Apply 2 g topically 4 (four) times daily as needed. 07/27/20   Tilden Fossa, MD  ?diphenhydramine-acetaminophen (TYLENOL PM) 25-500 MG TABS tablet Take 0.5 tablets by mouth at bedtime as needed. 1/2 tablet    [provider]  ?gabapentin (NEURONTIN) 300 MG capsule Take 1 capsule (300 mg total) by mouth 3 (three) times daily. ?Patient taking differently: Take 300 mg by mouth daily. 10/05/20   Barbette Merino, NP  ?gatifloxacin (ZYMAXID) 0.5 % SOLN Place 1 drop into the right eye 4 (four) times daily. 06/28/20   [provider]  ?glipiZIDE (GLUCOTROL) 5 MG tablet Take 1 tablet (5 mg total) by mouth 2 (two) times daily before a meal. 04/22/20 04/22/21  Barbette Merino, NP  ?hydrALAZINE (APRESOLINE) 50 MG tablet Take 1  tablet (50 mg total) by mouth 2 (two) times daily. ?Patient taking differently: Take 50 mg by mouth daily. 04/22/20   Barbette Merino, NP  ?ketorolac (ACULAR) 0.5 % ophthalmic solution Place 1 drop into the right eye 4 (four) times daily. ?Patient not taking: Reported on 02/01/2021 06/28/20   [provider]  ?lidocaine (LIDODERM) 5 % Place 1 patch onto the skin daily. Remove & Discard patch within 12 hours or as directed by MD 01/10/21   Arthor Captain, PA-C  ?losartan (COZAAR) 50 MG tablet Take 1 tablet (50 mg total) by mouth daily. 10/05/20 10/05/21  Barbette Merino, NP  ?metFORMIN (GLUCOPHAGE) 500 MG tablet Take 2 tablets (1,000 mg total) by mouth 2 (two) times daily with a meal. ?Patient taking differently: Take 1,000 mg by mouth daily with breakfast. 10/05/20 10/05/21  Barbette Merino, NP  ?mirabegron ER (MYRBETRIQ) 25 MG TB24 tablet Take 25 mg by mouth daily.    [provider]  ?naproxen (NAPROSYN) 250 MG tablet Take 1 tablet (250 mg total) by mouth 2 (two) times daily with a meal. 02/05/21   Linwood Dibbles, MD  ?[facility] VERIO test strip Use 1-4 times daily as directed/needed   DX E11.9 ?Patient not taking: No sig reported 04/25/20   Barbette Merino, NP  ?potassium chloride (KLOR-CON M) 20 MEQ tablet Take 0.5 tablets (10 mEq total) by mouth daily. 02/25/21  Dione Booze, MD  ?prednisoLONE acetate (PRED FORTE) 1 % ophthalmic suspension Place 1 drop into the right eye 4 (four) times daily. ?Patient not taking: Reported on 02/01/2021 06/28/20   [provider]  ?predniSONE (DELTASONE) 50 MG tablet Take 1 tablet (50 mg total) by mouth daily. 03/10/21   Dione Booze, MD  ?rosuvastatin (CRESTOR) 10 MG tablet Take 1 tablet (10 mg total) by mouth at bedtime. 10/05/20 10/05/21  Barbette Merino, NP  ?   ? ?Allergies    ?Codeine, Ibuprofen, and Imdur [isosorbide nitrate]   ? ?Review of Systems   ?Review of Systems  ?Constitutional:  Negative for chills and fever.  ?HENT:  Negative for congestion.   ?Eyes:   Negative for visual disturbance.  ?Respiratory:  Negative for shortness of breath.   ?Cardiovascular:  Negative for chest pain.  ?Gastrointestinal:  Negative for abdominal pain and vomiting.  ?Genitourinary:  Negative for dysuria and flank pain.  ?Musculoskeletal:  Negative for back pain, neck pain and neck stiffness.  ?Skin:  Negative for rash.  ?Neurological:  Positive for headaches. Negative for light-headedness.  ? ?Physical Exam ?Updated Vital Signs ?BP (!) 160/51   Pulse 62   Temp 97.7 ?F (36.5 ?C) (Oral)   Resp 17   SpO2 100%  ?Physical Exam ?Vitals and nursing note reviewed.  ?Constitutional:   ?   General: She is not in acute distress. ?   Appearance: She is well-developed.  ?HENT:  ?   Head: Normocephalic and atraumatic.  ?   Mouth/Throat:  ?   Mouth: Mucous membranes are moist.  ?Eyes:  ?   General:     ?   Right eye: No discharge.     ?   Left eye: No discharge.  ?   Conjunctiva/sclera: Conjunctivae normal.  ?Neck:  ?   Trachea: No tracheal deviation.  ?Cardiovascular:  ?   Rate and Rhythm: Normal rate.  ?Pulmonary:  ?   Effort: Pulmonary effort is normal.  ?Abdominal:  ?   General: There is no distension.  ?   Palpations: Abdomen is soft.  ?   Tenderness: There is no abdominal tenderness.  ?Musculoskeletal:  ?   Cervical back: Normal range of motion and neck supple. No rigidity.  ?   Comments: Patient has mild tenderness to base of calcaneus, no external signs of infection.  No ankle tenderness.  No significant bony tenderness.  No edema.  Normal distal pulses dorsalis pedis and posterior tibial.  ?Skin: ?   General: Skin is warm.  ?   Capillary Refill: Capillary refill takes less than 2 seconds.  ?   Findings: No rash.  ?Neurological:  ?   General: No focal deficit present.  ?   Mental Status: She is alert.  ?   Cranial Nerves: No cranial nerve deficit or facial asymmetry.  ?   Sensory: No sensory deficit.  ?   Motor: No weakness.  ?Psychiatric:     ?   Mood and Affect: Mood normal.  ? ? ?ED  Results / Procedures / Treatments   ?Labs ?(all labs ordered are listed, but only abnormal results are displayed) ?Labs Reviewed - No data to display ? ?EKG ?None ? ?Radiology ?No results found. ? ?Procedures ?Procedures  ? ? ?Medications Ordered in ED ?Medications  ?acetaminophen (TYLENOL) tablet 1,000 mg (has no administration in time range)  ? ? ?ED Course/ Medical Decision Making/ A&P ?  ?                        ?  Medical Decision Making ?Risk ?OTC drugs. ? ? ?Patient presents with recurrent headache and foot pain.  The foot pain she has had recurrently for months, reviewed x-ray on the 27th no acute abnormalities.  Clinical concern for planter fasciitis or other nonbony pathology.  No indication for repeat imaging at this time without any new injury.  No signs of infection. ?Patient's headache is similar to previous, no acute neurologic deficits, no signs of meningitis, no trauma.  Patient has no significant headache at this time.  Tylenol as needed for pain and follow-up outpatient.  Reviewed medical records and multiple visits for different pathologies in the past 6 months.  Patient comfortable Tylenol for pain. ? ? ? ? ? ? ? ?Final Clinical Impression(s) / ED Diagnoses ?Final diagnoses:  ?Headache, unspecified headache type  ?Diabetic neuropathy, painful (HCC)  ? ? ?Rx / DC Orders ?ED Discharge Orders   ? ? None  ? ?  ? ? ?  ?Blane Ohara, MD ?03/27/21 [phone] ? ?

## 2021-03-23 NOTE — ED Notes (Signed)
Pt c/o L leg pain that starts at the foot and radiates up her leg ?

## 2021-03-23 NOTE — ED Notes (Signed)
Pt was able to keep down a cup of water, no complications.  ?

## 2021-03-23 NOTE — Discharge Instructions (Addendum)
Use Tylenol every 4 hours as needed for pain.  Follow-up with your local doctor.  Continue to wear foot/ankle support. ?

## 2021-03-23 NOTE — ED Provider Triage Note (Signed)
Emergency Medicine Provider Triage Evaluation Note ? ?[name] , a 76 y.o. female  was evaluated in triage.  Pt complains of ongoing L foot pain. Constant since last ED visit on 03/20/21. Took tylenol w/o relief. No new fall or trauma since last evaluated. ? ?Review of Systems  ?Positive: As above ?Negative: As above ? ?Physical Exam  ?BP (!) 136/110 (BP Location: Right Arm)   Pulse 67   Temp 97.9 ?F (36.6 ?C)   Resp 16   SpO2 98%  ?Gen:   Awake, no distress   ?Resp:  Normal effort  ?MSK:   Moves extremities without difficulty  ?Other:  Agitated  ? ?Medical Decision Making  ?Medically screening exam initiated at 12:41 AM.  Appropriate orders placed.  [name] was informed that the remainder of the evaluation will be completed by another provider, this initial triage assessment does not replace that evaluation, and the importance of remaining in the ED until their evaluation is complete. ? ?L foot pain - prior Xray reviewed which was reassuring. ?  Adriana Madura, PA-C ?03/23/21 9211 ? ?

## 2021-03-23 NOTE — ED Triage Notes (Signed)
Reports nontraumatic left foot pain and a headache x 1 week.  ?

## 2021-03-23 NOTE — Discharge Instructions (Addendum)
You likely have migraine or tension headaches. ? ?Please take Tylenol for headache and Reglan for severe headaches. ? ?Please use Voltaren for your back and your neck ? ?You need to call neurology for follow-up ? ?Return to ER if you have worse headache, vomiting, neck pain and fever ?

## 2021-03-23 NOTE — ED Triage Notes (Signed)
Patient stated "I feel my heart beat in my head." She went to [facility] hospital, but only got 2 tylenol. She said it did not help her at all. Said her head has been hurting for a while now. ?

## 2021-03-23 NOTE — ED Provider Notes (Signed)
?[facility]-EMERGENCY DEPT ?Provider Note ? ? ?CSN: [id number] ?Arrival date & time: 03/23/21  1952 ? ?  ? ?History ? ?Chief Complaint  ?Patient presents with  ? Headache  ? ? ?[name] is a 76 y.o. female history of chronic migraines, hypertension here presenting with headache.  Patient has been having headaches for the last month or so.  Patient had multiple CT scans and multiple ED visits.  Patient has not seen a neurologist for this problem yet.  Patient was recently seen in the ED earlier today in the ED.  Patient was given a dose of Tylenol and states that she wants another dose of Tylenol and that she wants a Voltaren gel for her neck.  Denies any vomiting or fevers.  ? ?The history is provided by the patient.  ? ?  ? ?Home Medications ?Prior to Admission medications   ?Medication Sig Start Date End Date Taking? Authorizing Provider  ?albuterol (VENTOLIN HFA) 108 (90 Base) MCG/ACT inhaler Inhale 1-2 puffs into the lungs every 6 (six) hours as needed for wheezing or shortness of breath. 04/22/20   Barbette Merino, NP  ?aspirin EC 81 MG tablet Take 81 mg by mouth daily. Swallow whole.    [provider]  ?chlorthalidone (HYGROTON) 25 MG tablet Take 1 tablet (25 mg total) by mouth daily. 10/05/20 10/05/21  Barbette Merino, NP  ?clopidogrel (PLAVIX) 75 MG tablet Take 1 tablet (75 mg total) by mouth daily. 04/22/20   Barbette Merino, NP  ?diclofenac Sodium (VOLTAREN) 1 % GEL Apply 2 g topically 4 (four) times daily as needed. 07/27/20   Tilden Fossa, MD  ?diphenhydramine-acetaminophen (TYLENOL PM) 25-500 MG TABS tablet Take 0.5 tablets by mouth at bedtime as needed. 1/2 tablet    [provider]  ?gabapentin (NEURONTIN) 300 MG capsule Take 1 capsule (300 mg total) by mouth 3 (three) times daily. ?Patient taking differently: Take 300 mg by mouth daily. 10/05/20   Barbette Merino, NP  ?gatifloxacin (ZYMAXID) 0.5 % SOLN Place 1 drop into the right eye 4 (four) times daily. 06/28/20    [provider]  ?glipiZIDE (GLUCOTROL) 5 MG tablet Take 1 tablet (5 mg total) by mouth 2 (two) times daily before a meal. 04/22/20 04/22/21  Barbette Merino, NP  ?hydrALAZINE (APRESOLINE) 50 MG tablet Take 1 tablet (50 mg total) by mouth 2 (two) times daily. ?Patient taking differently: Take 50 mg by mouth daily. 04/22/20   Barbette Merino, NP  ?ketorolac (ACULAR) 0.5 % ophthalmic solution Place 1 drop into the right eye 4 (four) times daily. ?Patient not taking: Reported on 02/01/2021 06/28/20   [provider]  ?lidocaine (LIDODERM) 5 % Place 1 patch onto the skin daily. Remove & Discard patch within 12 hours or as directed by MD 01/10/21   Arthor Captain, PA-C  ?losartan (COZAAR) 50 MG tablet Take 1 tablet (50 mg total) by mouth daily. 10/05/20 10/05/21  Barbette Merino, NP  ?metFORMIN (GLUCOPHAGE) 500 MG tablet Take 2 tablets (1,000 mg total) by mouth 2 (two) times daily with a meal. ?Patient taking differently: Take 1,000 mg by mouth daily with breakfast. 10/05/20 10/05/21  Barbette Merino, NP  ?mirabegron ER (MYRBETRIQ) 25 MG TB24 tablet Take 25 mg by mouth daily.    [provider]  ?naproxen (NAPROSYN) 250 MG tablet Take 1 tablet (250 mg total) by mouth 2 (two) times daily with a meal. 02/05/21   Linwood Dibbles, MD  ?Lum Babe test  strip Use 1-4 times daily as directed/needed   DX E11.9 ?Patient not taking: No sig reported 04/25/20   Barbette Merino, NP  ?potassium chloride (KLOR-CON M) 20 MEQ tablet Take 0.5 tablets (10 mEq total) by mouth daily. 02/25/21   Dione Booze, MD  ?prednisoLONE acetate (PRED FORTE) 1 % ophthalmic suspension Place 1 drop into the right eye 4 (four) times daily. ?Patient not taking: Reported on 02/01/2021 06/28/20   [provider]  ?predniSONE (DELTASONE) 50 MG tablet Take 1 tablet (50 mg total) by mouth daily. 03/10/21   Dione Booze, MD  ?rosuvastatin (CRESTOR) 10 MG tablet Take 1 tablet (10 mg total) by mouth at bedtime. 10/05/20 10/05/21  Barbette Merino, NP  ?    ? ?Allergies    ?Codeine, Ibuprofen, and Imdur [isosorbide nitrate]   ? ?Review of Systems   ?Review of Systems  ?Neurological:  Positive for headaches.  ?All other systems reviewed and are negative. ? ?Physical Exam ?Updated Vital Signs ?BP 136/75 (BP Location: Right Arm)   Pulse 68   Temp 98.2 ?F (36.8 ?C) (Oral)   Resp 16   Ht 5\' 5"  (1.651 m)   Wt 64 kg   SpO2 100%   BMI 23.48 kg/m?  ?Physical Exam ?Vitals and nursing note reviewed.  ?Constitutional:   ?   Comments: Slightly uncomfortable but not in acute distress  ?HENT:  ?   Head: Normocephalic and atraumatic.  ?   Mouth/Throat:  ?   Mouth: Mucous membranes are moist.  ?Eyes:  ?   Extraocular Movements: Extraocular movements intact.  ?   Pupils: Pupils are equal, round, and reactive to light.  ?Cardiovascular:  ?   Rate and Rhythm: Normal rate and regular rhythm.  ?Neurological:  ?   Mental Status: She is alert and oriented to person, place, and time.  ?   Comments: No obvious facial droop.  Patient's cranial nerves II to XII is intact.  Patient has normal gait.  Patient has normal strength and sensation bilateral arms and legs  ?Psychiatric:     ?   Mood and Affect: Mood normal.     ?   Behavior: Behavior normal.  ? ? ?ED Results / Procedures / Treatments   ?Labs ?(all labs ordered are listed, but only abnormal results are displayed) ?Labs Reviewed - No data to display ? ?EKG ?None ? ?Radiology ?No results found. ? ?Procedures ?Procedures  ? ? ?Medications Ordered in ED ?Medications  ?lidocaine (LIDODERM) 5 % 1 patch (1 patch Transdermal Patch Applied 03/23/21 2130)  ?acetaminophen (TYLENOL) tablet 1,000 mg (1,000 mg Oral Given 03/23/21 2130)  ?metoCLOPramide (REGLAN) tablet 10 mg (10 mg Oral Given 03/23/21 2130)  ? ? ?ED Course/ Medical Decision Making/ A&P ?  ?                        ?Medical Decision Making ?[name] is a 76 y.o. female here with headache.  Patient has headaches that been going on for the last month or so.  I reviewed her records from  last several ED visits including this morning.  Her CT head and her labs have been unremarkable.  She states that the Tylenol helped slightly with the headache and she has nonfocal neuro exam.  I offered to have the nurse place an IV for IV medicines for migraine but she just wants a Tylenol and oral medicines.  She has not seen a neurologist for this so I will  refer her to neurology.  Finally, she wants a refill of her patch for her neck.  Patient does not have any meningeal signs and likely has tension headache. ? ? ?Problems Addressed: ?Tension headache: chronic illness or injury with exacerbation, progression, or side effects of treatment ? ?Amount and/or Complexity of Data Reviewed ?External Data Reviewed: radiology and notes. ? ?Risk ?OTC drugs. ?Prescription drug management. ? ?Final Clinical Impression(s) / ED Diagnoses ?Final diagnoses:  ?None  ? ? ?Rx / DC Orders ?ED Discharge Orders   ? ? None  ? ?  ? ? ?  ?[name], MD ?03/23/21 2144 ? ?

## 2021-03-28 ENCOUNTER — Emergency Department (HOSPITAL_COMMUNITY)
Admission: EM | Admit: 2021-03-28 | Discharge: 2021-03-28 | Disposition: A | Discharge status: Home / Self Care | Payer: Medicare Other | Attending: Emergency Medicine | Admitting: Emergency Medicine

## 2021-03-28 ENCOUNTER — Telehealth: Payer: Self-pay | Admitting: Clinical

## 2021-03-28 ENCOUNTER — Other Ambulatory Visit: Payer: Self-pay

## 2021-03-28 DIAGNOSIS — Z7902 Long term (current) use of antithrombotics/antiplatelets: Secondary | ICD-10-CM | POA: Diagnosis not present

## 2021-03-28 DIAGNOSIS — R519 Headache, unspecified: Secondary | ICD-10-CM | POA: Diagnosis not present

## 2021-03-28 DIAGNOSIS — Z7982 Long term (current) use of aspirin: Secondary | ICD-10-CM | POA: Diagnosis not present

## 2021-03-28 DIAGNOSIS — T148XXA Other injury of unspecified body region, initial encounter: Secondary | ICD-10-CM

## 2021-03-28 DIAGNOSIS — M79672 Pain in left foot: Secondary | ICD-10-CM | POA: Diagnosis present

## 2021-03-28 DIAGNOSIS — L03116 Cellulitis of left lower limb: Secondary | ICD-10-CM | POA: Insufficient documentation

## 2021-03-28 DIAGNOSIS — Z79899 Other long term (current) drug therapy: Secondary | ICD-10-CM | POA: Insufficient documentation

## 2021-03-28 DIAGNOSIS — G44209 Tension-type headache, unspecified, not intractable: Secondary | ICD-10-CM

## 2021-03-28 MED ORDER — CEPHALEXIN 500 MG PO CAPS: 500.0000 mg | ORAL_CAPSULE | Freq: Four times a day (QID) | ORAL | 0 refills | Status: DC

## 2021-03-28 MED ORDER — ACETAMINOPHEN 500 MG PO TABS
1000.0000 mg | ORAL_TABLET | Freq: Once | ORAL | Status: AC
  Administered 2021-03-28: 1000 mg via ORAL
  Filled 2021-03-28: qty 2

## 2021-03-28 NOTE — Discharge Instructions (Addendum)
There appears to be a blister along the plantar aspect of you left foot. Your foot could be developing cellulitis.  ?

## 2021-03-28 NOTE — ED Triage Notes (Signed)
Pt here for eval of pain to L foot after stepping on a toothpick.  ?

## 2021-03-28 NOTE — ED Provider Notes (Addendum)
[facility] EMERGENCY DEPARTMENT Provider Note   CSN: [id number] Arrival date & time: 03/28/21  1652     History  Chief Complaint  Patient presents with   Foot Pain    Niyla Zamor is a 76 y.o. female.   Foot Pain Associated symptoms include headaches.   76 year old female presenting to the Emergency Department with left plantar foot pain that has been ongoing for the past week.  The patient states that she stepped on a toothpick and has had pain in the plantar aspect of her foot in between her first and second toes ever since.  She endorses pain with bearing weight. She denies fevers or chills.  She has a history of chronic migraines and presents with a headache care.  She has not seen a neurologist for this problem yet.  She has been having headaches for the last month or scallops.  She has had multiple CT scans and multiple ED visits for the same complaint of headache.  Home Medications Prior to Admission medications   Medication Sig Start Date End Date Taking? Authorizing Provider  cephALEXin (KEFLEX) 500 MG capsule Take 1 capsule (500 mg total) by mouth 4 (four) times daily. 03/28/21  Yes Ernie Avena, MD  albuterol (VENTOLIN HFA) 108 (90 Base) MCG/ACT inhaler Inhale 1-2 puffs into the lungs every 6 (six) hours as needed for wheezing or shortness of breath. 04/22/20   Barbette Merino, NP  aspirin EC 81 MG tablet Take 81 mg by mouth daily. Swallow whole.    [provider]  chlorthalidone (HYGROTON) 25 MG tablet Take 1 tablet (25 mg total) by mouth daily. 10/05/20 10/05/21  Barbette Merino, NP  clopidogrel (PLAVIX) 75 MG tablet Take 1 tablet (75 mg total) by mouth daily. 04/22/20   Barbette Merino, NP  diclofenac Sodium (VOLTAREN) 1 % GEL Apply 2 g topically 4 (four) times daily as needed. 03/23/21   [name], MD  diphenhydramine-acetaminophen (TYLENOL PM) 25-500 MG TABS tablet Take 0.5 tablets by mouth at bedtime as needed. 1/2 tablet    [provider]  gabapentin (NEURONTIN) 300 MG capsule Take 1 capsule (300 mg total) by mouth 3 (three) times daily. Patient taking differently: Take 300 mg by mouth daily. 10/05/20   Barbette Merino, NP  gatifloxacin (ZYMAXID) 0.5 % SOLN Place 1 drop into the right eye 4 (four) times daily. 06/28/20   [provider]  glipiZIDE (GLUCOTROL) 5 MG tablet Take 1 tablet (5 mg total) by mouth 2 (two) times daily before a meal. 04/22/20 04/22/21  Barbette Merino, NP  hydrALAZINE (APRESOLINE) 50 MG tablet Take 1 tablet (50 mg total) by mouth 2 (two) times daily. Patient taking differently: Take 50 mg by mouth daily. 04/22/20   Barbette Merino, NP  ketorolac (ACULAR) 0.5 % ophthalmic solution Place 1 drop into the right eye 4 (four) times daily. Patient not taking: Reported on 02/01/2021 06/28/20   [provider]  lidocaine (LIDODERM) 5 % Place 1 patch onto the skin daily. Remove & Discard patch within 12 hours or as directed by MD 01/10/21   Arthor Captain, PA-C  losartan (COZAAR) 50 MG tablet Take 1 tablet (50 mg total) by mouth daily. 10/05/20 10/05/21  Barbette Merino, NP  metFORMIN (GLUCOPHAGE) 500 MG tablet Take 2 tablets (1,000 mg total) by mouth 2 (two) times daily with a meal. Patient taking differently: Take 1,000 mg by mouth daily with breakfast. 10/05/20 10/05/21  Barbette Merino,  NP  metoCLOPramide (REGLAN) 10 MG tablet Take 1 tablet (10 mg total) by mouth every 6 (six) hours as needed for nausea (nausea/headache). 03/23/21   [name], MD  mirabegron ER (MYRBETRIQ) 25 MG TB24 tablet Take 25 mg by mouth daily.    [provider]  naproxen (NAPROSYN) 250 MG tablet Take 1 tablet (250 mg total) by mouth 2 (two) times daily with a meal. 02/05/21   Linwood Dibbles, MD  [facility] VERIO test strip Use 1-4 times daily as directed/needed   DX E11.9 Patient not taking: No sig reported 04/25/20   Barbette Merino, NP  potassium chloride (KLOR-CON M) 20 MEQ tablet Take 0.5 tablets (10 mEq total)  by mouth daily. 02/25/21   Dione Booze, MD  prednisoLONE acetate (PRED FORTE) 1 % ophthalmic suspension Place 1 drop into the right eye 4 (four) times daily. Patient not taking: Reported on 02/01/2021 06/28/20   [provider]  predniSONE (DELTASONE) 50 MG tablet Take 1 tablet (50 mg total) by mouth daily. 03/10/21   Dione Booze, MD  rosuvastatin (CRESTOR) 10 MG tablet Take 1 tablet (10 mg total) by mouth at bedtime. 10/05/20 10/05/21  Barbette Merino, NP      Allergies    Codeine, Ibuprofen, and Imdur [isosorbide nitrate]    Review of Systems   Review of Systems  Musculoskeletal:  Positive for arthralgias and myalgias.  Neurological:  Positive for headaches.  All other systems reviewed and are negative.  Physical Exam Updated Vital Signs BP (!) 166/64 (BP Location: Left Arm)    Pulse 71    Temp 98.6 F (37 C) (Oral)    Resp 18    SpO2 96%  Physical Exam Vitals and nursing note reviewed.  Constitutional:      General: She is not in acute distress. HENT:     Head: Normocephalic and atraumatic.  Eyes:     Conjunctiva/sclera: Conjunctivae normal.     Pupils: Pupils are equal, round, and reactive to light.  Cardiovascular:     Rate and Rhythm: Normal rate and regular rhythm.  Pulmonary:     Effort: Pulmonary effort is normal. No respiratory distress.  Abdominal:     General: There is no distension.     Tenderness: There is no guarding.  Musculoskeletal:        General: Tenderness present. No deformity or signs of injury.     Cervical back: Neck supple.     Comments: Mild tenderness palpation to the plantar aspect of the left foot in between the first and second digits.  No significant surrounding erythema, mild warmth present, appears to be a blister present.  Skin:    Findings: No lesion or rash.  Neurological:     General: No focal deficit present.     Mental Status: She is alert. Mental status is at baseline.     Cranial Nerves: No cranial nerve deficit.     Sensory:  No sensory deficit.     Motor: No weakness.    ED Results / Procedures / Treatments   Labs (all labs ordered are listed, but only abnormal results are displayed) Labs Reviewed - No data to display  EKG None  Radiology No results found.  Procedures Procedures    Medications Ordered in ED Medications  acetaminophen (TYLENOL) tablet 1,000 mg (has no administration in time range)    ED Course/ Medical Decision Making/ A&P  Medical Decision Making Risk OTC drugs. Prescription drug management.   76 year old female presenting to the Emergency Department with left plantar foot pain that has been ongoing for the past week.  The patient states that she stepped on a toothpick and has had pain in the plantar aspect of her foot in between her first and second toes ever since.  She endorses pain with bearing weight. She denies fevers or chills.  She has a history of chronic migraines and presents with a headache care.  She has not seen a neurologist for this problem yet.  She has been having headaches for the last month or scallops.  She has had multiple CT scans and multiple ED visits for the same complaint of headache.  I reviewed her records from her past ED visits which included CT head and unremarkable labs.  She has presented multiple times for headache and has a nonfocal neurologic exam.  The patient was requesting medicine for headache for which I provided Tylenol.  No meningeal signs, feel that patient's symptoms are likely tension headache.  Recommended Tylenol and Motrin for pain control, follow-up outpatient with podiatry. Provided instructions for blister care. Given worsening pain, will treat for early cellulitis. Low concern for abscess or osteomyelitis at this time. Post op shoe provided.  Final Clinical Impression(s) / ED Diagnoses Final diagnoses:  Foot pain, left  Blister  Cellulitis of left lower extremity    Rx / DC Orders ED Discharge  Orders          Ordered    Ambulatory referral to Podiatry        03/28/21 1957    cephALEXin (KEFLEX) 500 MG capsule  4 times daily        03/28/21 1958              Ernie Avena, MD 03/28/21 [place]    Ernie Avena, MD 03/28/21 2004

## 2021-03-28 NOTE — Telephone Encounter (Signed)
[facility] ?General Follow Up Note ? ?03/28/2021 ?Name: [name] MRN: [id number] DOB: 01-Jul-1945 ?[name] is a 76 y.o. year old female who sees Barbette Merino, NP for primary care. LCSW was initially consulted to assist with transportation. ? ?Interpreter: No.   Interpreter Name & Language: none ? ?Assessment: Patient experiencing frequent ED visits. Patient has a PCP appointment scheduled [facility]) 03/29/21. ? ?Ongoing Intervention: CSW called patient on 3/2 and 03/24/21; patient did not answer and no voicemail was available. Called patient on 3/6 and patient answered. CSW advised patient of PCP appointment 3/8 and offered to assist patient with transportation to the appointment. Patient said "I don't feel like doing nothing right now" and declined to confirm if she would attend the appointment or if she needed transportation. Called patient back today, 03/28/21 and no answer, no voicemail available.  ? ?Review of patient status, including review of consultants reports, relevant laboratory and other test results, and collaboration with appropriate care team members and the patient's provider was performed as part of comprehensive patient evaluation and provision of services.   ? ?Abigail Butts, LCSW ?[facility] ?[facility] ?[phone] ?  ? ?

## 2021-03-29 ENCOUNTER — Ambulatory Visit: Payer: Medicare Other | Admitting: Nurse Practitioner

## 2021-03-30 ENCOUNTER — Encounter (HOSPITAL_COMMUNITY): Payer: Self-pay | Admitting: Emergency Medicine

## 2021-03-30 ENCOUNTER — Other Ambulatory Visit: Payer: Self-pay

## 2021-03-30 ENCOUNTER — Emergency Department (HOSPITAL_COMMUNITY)
Admission: EM | Admit: 2021-03-30 | Discharge: 2021-03-30 | Disposition: A | Discharge status: Home / Self Care | Payer: Medicare Other | Attending: Emergency Medicine | Admitting: Emergency Medicine

## 2021-03-30 DIAGNOSIS — Z79899 Other long term (current) drug therapy: Secondary | ICD-10-CM | POA: Insufficient documentation

## 2021-03-30 DIAGNOSIS — S91332A Puncture wound without foreign body, left foot, initial encounter: Secondary | ICD-10-CM | POA: Diagnosis not present

## 2021-03-30 DIAGNOSIS — Z7984 Long term (current) use of oral hypoglycemic drugs: Secondary | ICD-10-CM | POA: Diagnosis not present

## 2021-03-30 DIAGNOSIS — Z7902 Long term (current) use of antithrombotics/antiplatelets: Secondary | ICD-10-CM | POA: Diagnosis not present

## 2021-03-30 DIAGNOSIS — Z7982 Long term (current) use of aspirin: Secondary | ICD-10-CM | POA: Diagnosis not present

## 2021-03-30 DIAGNOSIS — S91332D Puncture wound without foreign body, left foot, subsequent encounter: Secondary | ICD-10-CM

## 2021-03-30 DIAGNOSIS — W458XXA Other foreign body or object entering through skin, initial encounter: Secondary | ICD-10-CM | POA: Diagnosis not present

## 2021-03-30 MED ORDER — TRAMADOL HCL 50 MG PO TABS: 50.0000 mg | ORAL_TABLET | Freq: Four times a day (QID) | ORAL | 0 refills | Status: DC | PRN

## 2021-03-30 MED ORDER — ACETAMINOPHEN 500 MG PO TABS: 1000.0000 mg | ORAL_TABLET | Freq: Four times a day (QID) | ORAL | 0 refills | Status: DC | PRN

## 2021-03-30 MED ORDER — TRAMADOL HCL 50 MG PO TABS
50.0000 mg | ORAL_TABLET | Freq: Once | ORAL | Status: AC
  Administered 2021-03-30: 21:00:00 50 mg via ORAL
  Filled 2021-03-30: qty 1

## 2021-03-30 MED ORDER — ACETAMINOPHEN 500 MG PO TABS
1000.0000 mg | ORAL_TABLET | Freq: Once | ORAL | Status: AC
Start: 2021-03-30 — End: 2021-03-30
  Administered 2021-03-30: 21:00:00 1000 mg via ORAL
  Filled 2021-03-30: qty 2

## 2021-03-30 NOTE — ED Provider Notes (Signed)
?[facility]-EMERGENCY DEPT ?Provider Note ? ? ?CSN: [id number] ?Arrival date & time: 03/30/21  1848 ? ?  ? ?History ? ?Chief Complaint  ?Patient presents with  ? Leg Pain  ? Headache  ? ? ?[name] is a 76 y.o. female. ? ?HPI ?Patient reports that she stepped on a Q-tip and poked the bottom of her foot about a week ago.  Prior chart review indicates she stepped on a toothpick.  Either way she reports that the area is still painful.  She is not giving a good commitment as to whether or not she filled her antibiotics and started taking them as prescribed 2 days ago.  She reports that it still hurts and it hurts up into her leg as well.  She reports she does not really have any effective pain medication to take for it.  No fever no chills no malaise.  She reports being on this pain is triggering one of her migraines as well.  Migraines typical. ?  ? ?Home Medications ?Prior to Admission medications   ?Medication Sig Start Date End Date Taking? Authorizing Provider  ?acetaminophen (TYLENOL) 500 MG tablet Take 2 tablets (1,000 mg total) by mouth every 6 (six) hours as needed. 03/30/21  Yes Arby Barrette, MD  ?traMADol (ULTRAM) 50 MG tablet Take 1 tablet (50 mg total) by mouth every 6 (six) hours as needed. 1-2 tablets every 6 hours as needed for pain 03/30/21  Yes Shalon Councilman, Lebron Conners, MD  ?albuterol (VENTOLIN HFA) 108 (90 Base) MCG/ACT inhaler Inhale 1-2 puffs into the lungs every 6 (six) hours as needed for wheezing or shortness of breath. 04/22/20   Barbette Merino, NP  ?aspirin EC 81 MG tablet Take 81 mg by mouth daily. Swallow whole.    [provider]  ?cephALEXin (KEFLEX) 500 MG capsule Take 1 capsule (500 mg total) by mouth 4 (four) times daily. 03/28/21   Ernie Avena, MD  ?chlorthalidone (HYGROTON) 25 MG tablet Take 1 tablet (25 mg total) by mouth daily. 10/05/20 10/05/21  Barbette Merino, NP  ?clopidogrel (PLAVIX) 75 MG tablet Take 1 tablet (75 mg total) by mouth daily. 04/22/20   Barbette Merino, NP  ?diclofenac Sodium (VOLTAREN) 1 % GEL Apply 2 g topically 4 (four) times daily as needed. 03/23/21   [name], MD  ?diphenhydramine-acetaminophen (TYLENOL PM) 25-500 MG TABS tablet Take 0.5 tablets by mouth at bedtime as needed. 1/2 tablet    [provider]  ?gabapentin (NEURONTIN) 300 MG capsule Take 1 capsule (300 mg total) by mouth 3 (three) times daily. ?Patient taking differently: Take 300 mg by mouth daily. 10/05/20   Barbette Merino, NP  ?gatifloxacin (ZYMAXID) 0.5 % SOLN Place 1 drop into the right eye 4 (four) times daily. 06/28/20   [provider]  ?glipiZIDE (GLUCOTROL) 5 MG tablet Take 1 tablet (5 mg total) by mouth 2 (two) times daily before a meal. 04/22/20 04/22/21  Barbette Merino, NP  ?hydrALAZINE (APRESOLINE) 50 MG tablet Take 1 tablet (50 mg total) by mouth 2 (two) times daily. ?Patient taking differently: Take 50 mg by mouth daily. 04/22/20   Barbette Merino, NP  ?ketorolac (ACULAR) 0.5 % ophthalmic solution Place 1 drop into the right eye 4 (four) times daily. ?Patient not taking: Reported on 02/01/2021 06/28/20   [provider]  ?lidocaine (LIDODERM) 5 % Place 1 patch onto the skin daily. Remove & Discard patch within 12 hours or as directed by MD 01/10/21  Arthor Captain, PA-C  ?losartan (COZAAR) 50 MG tablet Take 1 tablet (50 mg total) by mouth daily. 10/05/20 10/05/21  Barbette Merino, NP  ?metFORMIN (GLUCOPHAGE) 500 MG tablet Take 2 tablets (1,000 mg total) by mouth 2 (two) times daily with a meal. ?Patient taking differently: Take 1,000 mg by mouth daily with breakfast. 10/05/20 10/05/21  Barbette Merino, NP  ?metoCLOPramide (REGLAN) 10 MG tablet Take 1 tablet (10 mg total) by mouth every 6 (six) hours as needed for nausea (nausea/headache). 03/23/21   [name], MD  ?mirabegron ER (MYRBETRIQ) 25 MG TB24 tablet Take 25 mg by mouth daily.    [provider]  ?naproxen (NAPROSYN) 250 MG tablet Take 1 tablet (250 mg total) by mouth 2  (two) times daily with a meal. 02/05/21   Linwood Dibbles, MD  ?[employer] VERIO test strip Use 1-4 times daily as directed/needed   DX E11.9 ?Patient not taking: No sig reported 04/25/20   Barbette Merino, NP  ?potassium chloride (KLOR-CON M) 20 MEQ tablet Take 0.5 tablets (10 mEq total) by mouth daily. 02/25/21   Dione Booze, MD  ?prednisoLONE acetate (PRED FORTE) 1 % ophthalmic suspension Place 1 drop into the right eye 4 (four) times daily. ?Patient not taking: Reported on 02/01/2021 06/28/20   [provider]  ?predniSONE (DELTASONE) 50 MG tablet Take 1 tablet (50 mg total) by mouth daily. 03/10/21   Dione Booze, MD  ?rosuvastatin (CRESTOR) 10 MG tablet Take 1 tablet (10 mg total) by mouth at bedtime. 10/05/20 10/05/21  Barbette Merino, NP  ?   ? ?Allergies    ?Codeine, Ibuprofen, and Imdur [isosorbide nitrate]   ? ?Review of Systems   ?Review of Systems ?10 systems reviewed negative except as per HPI ?Physical Exam ?Updated Vital Signs ?BP (!) 167/71   Pulse 65   Temp 98.2 ?F (36.8 ?C) (Oral)   Resp 16   SpO2 99%  ?Physical Exam ?Constitutional:   ?   Comments: Alert nontoxic clinically well in appearance.  No respiratory distress.  ?HENT:  ?   Mouth/Throat:  ?   Pharynx: Oropharynx is clear.  ?Eyes:  ?   Extraocular Movements: Extraocular movements intact.  ?Cardiovascular:  ?   Rate and Rhythm: Normal rate.  ?Pulmonary:  ?   Effort: Pulmonary effort is normal.  ?Musculoskeletal:     ?   General: Normal range of motion.  ?   Comments: There is some appreciable induration in the pad of the foot just beneath the toes.  The area between all the toes is in good condition.  There are no wounds or maceration.  There is no general swelling of the foot.  (Patient reports it was more swollen before).  No swelling of the lower leg.  No Swelling of the knee.  ?Skin: ?   General: Skin is warm and dry.  ?Neurological:  ?   General: No focal deficit present.  ?   Mental Status: She is oriented to person, place, and time.   ?Psychiatric:     ?   Mood and Affect: Mood normal.  ? ? ? ? ? ?ED Results / Procedures / Treatments   ?Labs ?(all labs ordered are listed, but only abnormal results are displayed) ?Labs Reviewed - No data to display ? ?EKG ?None ? ?Radiology ?No results found. ? ?Procedures ?Procedures  ? ? ?Medications Ordered in ED ?Medications  ?traMADol (ULTRAM) tablet 50 mg (50 mg Oral Given 03/30/21 2054)  ?acetaminophen (TYLENOL) tablet  1,000 mg (1,000 mg Oral Given 03/30/21 2054)  ? ? ?ED Course/ Medical Decision Making/ A&P ?  ?                        ?Medical Decision Making ?Risk ?OTC drugs. ?Prescription drug management. ? ? ?Patient was seen 2 days ago for similar complaint.  She does report the swelling is better.  She still has pain on the sole of the foot.  There is some appreciable induration or swelling in the area of pain on the pad adjacent to the second third and fourth toes..  Images do not show significant change but there is some appreciable tenderness and swelling.  Patient is advised she needs to fill and continue the prescription as previously prescribed.  Plan to give tramadol and acetaminophen for pain control.  Patient is advised she does need follow-up with podiatry to make sure this is resolving.  Puncture wounds if any retained foreign body could be indolent in nature with persistent pain and disability.. ? ? ? ? ? ? ? ?Final Clinical Impression(s) / ED Diagnoses ?Final diagnoses:  ?Puncture wound of left foot, subsequent encounter  ? ? ?Rx / DC Orders ?ED Discharge Orders   ? ?      Ordered  ?  traMADol (ULTRAM) 50 MG tablet  Every 6 hours PRN       ? 03/30/21 2136  ?  acetaminophen (TYLENOL) 500 MG tablet  Every 6 hours PRN       ? 03/30/21 2136  ? ?  ?  ? ?  ? ? ?  ?Arby BarrettePfeiffer, Hogan Hoobler, MD ?03/30/21 2146 ? ?

## 2021-03-30 NOTE — Discharge Instructions (Signed)
1.  An antibiotic prescription was sent to [facility] on [address].  That was sent from your last visit on 3\7.  If you have not picked this prescription up, pick it up as soon as possible.  If you are already taking it, finish the entire prescription. ?2.  Prescription for pain medications was sent to [facility] for you.  There is a prescription for tramadol to take with acetaminophen.  This is to help with pain into the finish your antibiotics. ?3.  Your wound appears to be improving but you should be seen by a podiatrist.  Contact information for [facility] podiatry was included in your discharge instructions.  Call to get an appointment as soon as possible and ideally less than a week. ?

## 2021-03-30 NOTE — ED Triage Notes (Signed)
Reports L leg pain since stepping on a toothpick. Also reports a bad HA, thinks it is related to her blood pressure. ?

## 2021-04-03 ENCOUNTER — Encounter (HOSPITAL_COMMUNITY): Payer: Self-pay | Admitting: Emergency Medicine

## 2021-04-03 ENCOUNTER — Other Ambulatory Visit: Payer: Self-pay

## 2021-04-03 ENCOUNTER — Emergency Department (HOSPITAL_COMMUNITY)
Admission: EM | Admit: 2021-04-03 | Discharge: 2021-04-04 | Disposition: A | Discharge status: Home / Self Care | Payer: Medicare Other | Attending: Emergency Medicine | Admitting: Emergency Medicine

## 2021-04-03 DIAGNOSIS — Z7901 Long term (current) use of anticoagulants: Secondary | ICD-10-CM | POA: Diagnosis not present

## 2021-04-03 DIAGNOSIS — I10 Essential (primary) hypertension: Secondary | ICD-10-CM | POA: Diagnosis present

## 2021-04-03 DIAGNOSIS — E119 Type 2 diabetes mellitus without complications: Secondary | ICD-10-CM | POA: Insufficient documentation

## 2021-04-03 DIAGNOSIS — Z79899 Other long term (current) drug therapy: Secondary | ICD-10-CM | POA: Diagnosis not present

## 2021-04-03 DIAGNOSIS — R519 Headache, unspecified: Secondary | ICD-10-CM | POA: Insufficient documentation

## 2021-04-03 DIAGNOSIS — Z7984 Long term (current) use of oral hypoglycemic drugs: Secondary | ICD-10-CM | POA: Diagnosis not present

## 2021-04-03 DIAGNOSIS — Z7982 Long term (current) use of aspirin: Secondary | ICD-10-CM | POA: Diagnosis not present

## 2021-04-03 NOTE — ED Triage Notes (Signed)
Patient reports elevated blood pressure this evening with headache .  ?

## 2021-04-04 ENCOUNTER — Emergency Department (HOSPITAL_COMMUNITY): Payer: Medicare Other

## 2021-04-04 DIAGNOSIS — I10 Essential (primary) hypertension: Secondary | ICD-10-CM | POA: Diagnosis not present

## 2021-04-04 LAB — CBC WITH DIFFERENTIAL/PLATELET
Abs Immature Granulocytes: 0.01 10*3/uL (ref 0.00–0.07)
Basophils Absolute: 0 10*3/uL (ref 0.0–0.1)
Basophils Relative: 1 %
Eosinophils Absolute: 0.1 10*3/uL (ref 0.0–0.5)
Eosinophils Relative: 2 %
HCT: 31 % — ABNORMAL LOW (ref 36.0–46.0)
Hemoglobin: 9.9 g/dL — ABNORMAL LOW (ref 12.0–15.0)
Immature Granulocytes: 0 %
Lymphocytes Relative: 23 %
Lymphs Abs: 1.5 10*3/uL (ref 0.7–4.0)
MCH: 30 pg (ref 26.0–34.0)
MCHC: 31.9 g/dL (ref 30.0–36.0)
MCV: 93.9 fL (ref 80.0–100.0)
Monocytes Absolute: 0.5 10*3/uL (ref 0.1–1.0)
Monocytes Relative: 8 %
Neutro Abs: 4.3 10*3/uL (ref 1.7–7.7)
Neutrophils Relative %: 66 %
Platelets: 259 10*3/uL (ref 150–400)
RBC: 3.3 MIL/uL — ABNORMAL LOW (ref 3.87–5.11)
RDW: 13.9 % (ref 11.5–15.5)
WBC: 6.4 10*3/uL (ref 4.0–10.5)
nRBC: 0 % (ref 0.0–0.2)

## 2021-04-04 LAB — BASIC METABOLIC PANEL
Anion gap: 8 (ref 5–15)
BUN: 7 mg/dL — ABNORMAL LOW (ref 8–23)
CO2: 30 mmol/L (ref 22–32)
Calcium: 8.4 mg/dL — ABNORMAL LOW (ref 8.9–10.3)
Chloride: 100 mmol/L (ref 98–111)
Creatinine, Ser: 1.02 mg/dL — ABNORMAL HIGH (ref 0.44–1.00)
GFR, Estimated: 58 mL/min — ABNORMAL LOW (ref >60)
Glucose, Bld: 87 mg/dL (ref 70–99)
Potassium: 3.4 mmol/L — ABNORMAL LOW (ref 3.5–5.1)
Sodium: 138 mmol/L (ref 135–145)

## 2021-04-04 NOTE — Discharge Instructions (Signed)
You were seen today with concerns for high blood pressure and headache.  Your work-up today is again reassuring.  Make sure that you take your blood pressure medications as prescribed and follow-up closely with your primary doctor for blood pressure recheck and titration of your medications. ?

## 2021-04-04 NOTE — ED Provider Notes (Signed)
?[facility] EMERGENCY DEPARTMENT ?Provider Note ? ? ?CSN: [id number] ?Arrival date & time: 04/03/21  2247 ? ?  ? ?History ? ?Chief Complaint  ?Patient presents with  ? Hypertension  ? ? ?[name] is a 76 y.o. female. ? ?HPI ? ?  ? ?This is a 76 year old female who presents with concerns for elevated blood pressure and headache.  Patient reports that she has had intermittent headache for the last week.  She believes that this is related to her blood pressure.  She states "when my blood pressure gets up I get a headache."  Denies any focal weakness or numbness.  Patient reports that she takes her blood pressure medication daily.  She took her blood pressure at home and it was 190 systolic.  She states "it just hurts."  She denies any history of headaches.  She does report some blurry vision that has been ongoing for several months and believes she needs new glasses.  She generally does states that she does not feel well. ? ?Home Medications ?Prior to Admission medications   ?Medication Sig Start Date End Date Taking? Authorizing Provider  ?acetaminophen (TYLENOL) 500 MG tablet Take 2 tablets (1,000 mg total) by mouth every 6 (six) hours as needed. 03/30/21   Arby Barrette, MD  ?albuterol (VENTOLIN HFA) 108 (90 Base) MCG/ACT inhaler Inhale 1-2 puffs into the lungs every 6 (six) hours as needed for wheezing or shortness of breath. 04/22/20   Barbette Merino, NP  ?aspirin EC 81 MG tablet Take 81 mg by mouth daily. Swallow whole.    [provider]  ?cephALEXin (KEFLEX) 500 MG capsule Take 1 capsule (500 mg total) by mouth 4 (four) times daily. 03/28/21   Ernie Avena, MD  ?chlorthalidone (HYGROTON) 25 MG tablet Take 1 tablet (25 mg total) by mouth daily. 10/05/20 10/05/21  Barbette Merino, NP  ?clopidogrel (PLAVIX) 75 MG tablet Take 1 tablet (75 mg total) by mouth daily. 04/22/20   Barbette Merino, NP  ?diclofenac Sodium (VOLTAREN) 1 % GEL Apply 2 g topically 4 (four) times daily as needed. 03/23/21    [name], MD  ?diphenhydramine-acetaminophen (TYLENOL PM) 25-500 MG TABS tablet Take 0.5 tablets by mouth at bedtime as needed. 1/2 tablet    [provider]  ?gabapentin (NEURONTIN) 300 MG capsule Take 1 capsule (300 mg total) by mouth 3 (three) times daily. ?Patient taking differently: Take 300 mg by mouth daily. 10/05/20   Barbette Merino, NP  ?gatifloxacin (ZYMAXID) 0.5 % SOLN Place 1 drop into the right eye 4 (four) times daily. 06/28/20   [provider]  ?glipiZIDE (GLUCOTROL) 5 MG tablet Take 1 tablet (5 mg total) by mouth 2 (two) times daily before a meal. 04/22/20 04/22/21  Barbette Merino, NP  ?hydrALAZINE (APRESOLINE) 50 MG tablet Take 1 tablet (50 mg total) by mouth 2 (two) times daily. ?Patient taking differently: Take 50 mg by mouth daily. 04/22/20   Barbette Merino, NP  ?ketorolac (ACULAR) 0.5 % ophthalmic solution Place 1 drop into the right eye 4 (four) times daily. ?Patient not taking: Reported on 02/01/2021 06/28/20   [provider]  ?lidocaine (LIDODERM) 5 % Place 1 patch onto the skin daily. Remove & Discard patch within 12 hours or as directed by MD 01/10/21   Arthor Captain, PA-C  ?losartan (COZAAR) 50 MG tablet Take 1 tablet (50 mg total) by mouth daily. 10/05/20 10/05/21  Barbette Merino, NP  ?metFORMIN (GLUCOPHAGE) 500 MG tablet  Take 2 tablets (1,000 mg total) by mouth 2 (two) times daily with a meal. ?Patient taking differently: Take 1,000 mg by mouth daily with breakfast. 10/05/20 10/05/21  Barbette MerinoKing, Crystal M, NP  ?metoCLOPramide (REGLAN) 10 MG tablet Take 1 tablet (10 mg total) by mouth every 6 (six) hours as needed for nausea (nausea/headache). 03/23/21   Charlynne PanderYao, David Hsienta, MD  ?mirabegron ER (MYRBETRIQ) 25 MG TB24 tablet Take 25 mg by mouth daily.    [provider]  ?naproxen (NAPROSYN) 250 MG tablet Take 1 tablet (250 mg total) by mouth 2 (two) times daily with a meal. 02/05/21   Linwood DibblesKnapp, Jon, MD  ?[facility] test strip Use 1-4 times daily as  directed/needed   DX E11.9 ?Patient not taking: No sig reported 04/25/20   Barbette MerinoKing, Crystal M, NP  ?potassium chloride (KLOR-CON M) 20 MEQ tablet Take 0.5 tablets (10 mEq total) by mouth daily. 02/25/21   Dione BoozeGlick, David, MD  ?prednisoLONE acetate (PRED FORTE) 1 % ophthalmic suspension Place 1 drop into the right eye 4 (four) times daily. ?Patient not taking: Reported on 02/01/2021 06/28/20   [provider]  ?predniSONE (DELTASONE) 50 MG tablet Take 1 tablet (50 mg total) by mouth daily. 03/10/21   Dione BoozeGlick, David, MD  ?rosuvastatin (CRESTOR) 10 MG tablet Take 1 tablet (10 mg total) by mouth at bedtime. 10/05/20 10/05/21  Barbette MerinoKing, Crystal M, NP  ?traMADol (ULTRAM) 50 MG tablet Take 1 tablet (50 mg total) by mouth every 6 (six) hours as needed. 1-2 tablets every 6 hours as needed for pain 03/30/21   Arby BarrettePfeiffer, Marcy, MD  ?   ? ?Allergies    ?Codeine, Ibuprofen, and Imdur [isosorbide nitrate]   ? ?Review of Systems   ?Review of Systems  ?Constitutional:  Negative for fever.  ?Respiratory:  Negative for shortness of breath.   ?Cardiovascular:  Negative for chest pain.  ?Neurological:  Positive for headaches. Negative for weakness and numbness.  ?All other systems reviewed and are negative. ? ?Physical Exam ?Updated Vital Signs ?BP (!) 194/72   Pulse 66   Temp 98.4 ?F (36.9 ?C) (Oral)   Resp 17   SpO2 99%  ?Physical Exam ?Vitals and nursing note reviewed.  ?Constitutional:   ?   Appearance: She is well-developed. She is not ill-appearing.  ?HENT:  ?   Head: Normocephalic and atraumatic.  ?   Nose: Nose normal.  ?   Mouth/Throat:  ?   Mouth: Mucous membranes are moist.  ?Eyes:  ?   Extraocular Movements: Extraocular movements intact.  ?   Pupils: Pupils are equal, round, and reactive to light.  ?Cardiovascular:  ?   Rate and Rhythm: Normal rate and regular rhythm.  ?   Heart sounds: Normal heart sounds.  ?Pulmonary:  ?   Effort: Pulmonary effort is normal. No respiratory distress.  ?   Breath sounds: No wheezing.  ?Abdominal:  ?    Palpations: Abdomen is soft.  ?Musculoskeletal:  ?   Cervical back: Neck supple.  ?Skin: ?   General: Skin is warm and dry.  ?Neurological:  ?   Mental Status: She is alert and oriented to person, place, and time.  ?   Comments: Fluent speech, cranial nerves II through XII intact, 5 out of 5 strength in all 4 extremities, no dysmetria to finger-nose-finger  ?Psychiatric:     ?   Mood and Affect: Mood normal.  ? ? ?ED Results / Procedures / Treatments   ?Labs ?(all labs ordered are listed, but  only abnormal results are displayed) ?Labs Reviewed  ?CBC WITH DIFFERENTIAL/PLATELET - Abnormal; Notable for the following components:  ?    Result Value  ? RBC 3.30 (*)   ? Hemoglobin 9.9 (*)   ? HCT 31.0 (*)   ? All other components within normal limits  ?BASIC METABOLIC PANEL - Abnormal; Notable for the following components:  ? Potassium 3.4 (*)   ? BUN 7 (*)   ? Creatinine, Ser 1.02 (*)   ? Calcium 8.4 (*)   ? GFR, Estimated 58 (*)   ? All other components within normal limits  ? ? ?EKG ?None ? ?Radiology ?CT Head Wo Contrast ? ?Result Date: 04/04/2021 ?CLINICAL DATA:  Headache, new or worsening. EXAM: CT HEAD WITHOUT CONTRAST TECHNIQUE: Contiguous axial images were obtained from the base of the skull through the vertex without intravenous contrast. RADIATION DOSE REDUCTION: This exam was performed according to the departmental dose-optimization program which includes automated exposure control, adjustment of the mA and/or kV according to patient size and/or use of iterative reconstruction technique. COMPARISON:  03/10/2021. FINDINGS: Brain: No acute intracranial hemorrhage, midline shift or mass effect. No extra-axial fluid collection. Mild periventricular white matter hypodensities are seen bilaterally. No hydrocephalus. Vascular: No hyperdense vessel or unexpected calcification. Skull: Normal. Negative for fracture or focal lesion. Sinuses/Orbits: No acute finding. Other: None. IMPRESSION: 1. No acute intracranial  process. 2. Mild chronic microvascular ischemic changes. Electronically Signed   By: Thornell Sartorius M.D.   On: 04/04/2021 04:30   ? ?Procedures ?Procedures  ? ? ?Medications Ordered in ED ?Medications - No data to displa

## 2021-04-04 NOTE — ED Provider Triage Note (Signed)
Emergency Medicine Provider Triage Evaluation Note ? ?[name] , a 76 y.o. female  was evaluated in triage.  Pt complains of elevated BP and headache. Onset about 1 week ago.  Denies thunderclap quality.  Patient does endorse blurry vision but states that this has been ongoing for months and that she needs to see the eye doctor that she is having difficulty reading.  She denies any chest pain or shortness of breath.  She reports compliance with her blood pressure medication. ? ?Review of Systems  ?Positive: As above  ?Negative: As above  ? ?Physical Exam  ?BP (!) 169/74 (BP Location: Right Arm)   Pulse 75   Temp 98.4 ?F (36.9 ?C) (Oral)   Resp 17   SpO2 97%  ?Gen:   Awake, no distress   ?Resp:  Normal effort  ?MSK:   Moves extremities without difficulty  ?Other:  Mental Status:  ?Alert, thought content appropriate, able to give a coherent history. Speech fluent without evidence of aphasia. Able to follow 2 step commands without difficulty.  ?Cranial Nerves:  ?II:  Peripheral visual fields grossly normal, pupils equal, round, reactive to light ?III,IV, VI: ptosis not present, extra-ocular motions intact bilaterally  ?V,VII: smile symmetric, facial light touch sensation equal ?VIII: hearing grossly normal to voice  ?X: uvula elevates symmetrically  ?XI: bilateral shoulder shrug symmetric and strong ?XII: midline tongue extension without fassiculations ?Motor:  ?Normal tone. 5/5 strength of BUE and BLE major muscle groups including strong and equal grip strength and dorsiflexion/plantar flexion ?Sensory: light touch normal in all extremities. ?Cerebellar: normal finger-to-nose with bilateral upper extremities, Romberg sign absent ?Gait: normal gait and balance. Able to walk on toes and heels with ease.  ? ? ?Medical Decision Making  ?Medically screening exam initiated at 2:34 AM.  Appropriate orders placed.  Nasreen Goedecke was informed that the remainder of the evaluation will be completed by another provider,  this initial triage assessment does not replace that evaluation, and the importance of remaining in the ED until their evaluation is complete. ? ? ?  ?Mare Ferrari, PA-C ?04/04/21 0932 ? ?

## 2021-04-11 ENCOUNTER — Emergency Department (HOSPITAL_COMMUNITY)
Admission: EM | Admit: 2021-04-11 | Discharge: 2021-04-12 | Disposition: A | Discharge status: Home / Self Care | Payer: Medicare Other | Attending: Emergency Medicine | Admitting: Emergency Medicine

## 2021-04-11 ENCOUNTER — Emergency Department (HOSPITAL_COMMUNITY): Payer: Medicare Other

## 2021-04-11 ENCOUNTER — Other Ambulatory Visit: Payer: Self-pay

## 2021-04-11 ENCOUNTER — Encounter (HOSPITAL_COMMUNITY): Payer: Self-pay

## 2021-04-11 DIAGNOSIS — R519 Headache, unspecified: Secondary | ICD-10-CM | POA: Insufficient documentation

## 2021-04-11 DIAGNOSIS — I10 Essential (primary) hypertension: Secondary | ICD-10-CM | POA: Diagnosis not present

## 2021-04-11 DIAGNOSIS — M79672 Pain in left foot: Secondary | ICD-10-CM | POA: Insufficient documentation

## 2021-04-11 DIAGNOSIS — E119 Type 2 diabetes mellitus without complications: Secondary | ICD-10-CM | POA: Diagnosis not present

## 2021-04-11 LAB — CBC WITH DIFFERENTIAL/PLATELET
Abs Immature Granulocytes: 0.03 10*3/uL (ref 0.00–0.07)
Basophils Absolute: 0 10*3/uL (ref 0.0–0.1)
Basophils Relative: 1 %
Eosinophils Absolute: 0 10*3/uL (ref 0.0–0.5)
Eosinophils Relative: 0 %
HCT: 32.3 % — ABNORMAL LOW (ref 36.0–46.0)
Hemoglobin: 10.4 g/dL — ABNORMAL LOW (ref 12.0–15.0)
Immature Granulocytes: 0 %
Lymphocytes Relative: 23 %
Lymphs Abs: 1.6 10*3/uL (ref 0.7–4.0)
MCH: 30.5 pg (ref 26.0–34.0)
MCHC: 32.2 g/dL (ref 30.0–36.0)
MCV: 94.7 fL (ref 80.0–100.0)
Monocytes Absolute: 0.5 10*3/uL (ref 0.1–1.0)
Monocytes Relative: 8 %
Neutro Abs: 4.7 10*3/uL (ref 1.7–7.7)
Neutrophils Relative %: 68 %
Platelets: 291 10*3/uL (ref 150–400)
RBC: 3.41 MIL/uL — ABNORMAL LOW (ref 3.87–5.11)
RDW: 13.6 % (ref 11.5–15.5)
WBC: 7 10*3/uL (ref 4.0–10.5)
nRBC: 0 % (ref 0.0–0.2)

## 2021-04-11 LAB — COMPREHENSIVE METABOLIC PANEL
ALT: 11 U/L (ref 0–44)
AST: 19 U/L (ref 15–41)
Albumin: 3.3 g/dL — ABNORMAL LOW (ref 3.5–5.0)
Alkaline Phosphatase: 71 U/L (ref 38–126)
Anion gap: 8 (ref 5–15)
BUN: 7 mg/dL — ABNORMAL LOW (ref 8–23)
CO2: 28 mmol/L (ref 22–32)
Calcium: 8.5 mg/dL — ABNORMAL LOW (ref 8.9–10.3)
Chloride: 105 mmol/L (ref 98–111)
Creatinine, Ser: 0.96 mg/dL (ref 0.44–1.00)
GFR, Estimated: >60 mL/min (ref >60)
Glucose, Bld: 112 mg/dL — ABNORMAL HIGH (ref 70–99)
Potassium: 3.3 mmol/L — ABNORMAL LOW (ref 3.5–5.1)
Sodium: 141 mmol/L (ref 135–145)
Total Bilirubin: 0.5 mg/dL (ref 0.3–1.2)
Total Protein: 7 g/dL (ref 6.5–8.1)

## 2021-04-11 NOTE — ED Provider Triage Note (Signed)
Emergency Medicine Provider Triage Evaluation Note ? ?[name] , a 76 y.o. female  was evaluated in triage.  Pt complains of headache.  States that same came on gradually throughout the day today.  Denies any nausea or vomiting or vision changes.  States that she feels like her blood pressure is high.  States that she only gets headaches when her blood pressure is high.  She does have a history of stroke but that was many years ago.  Endorses compliance with her blood pressure management, states she has taken all of her medications today. ? ?Additionally, of note, patient states that she stepped on a toothpick a couple of weeks ago and pain in her left foot related to this.  Denies any fevers or chills.  She is a diabetic. ? ?Review of Systems  ?Positive: Headache, left foot pain ?Negative: Fevers, chills, nausea, vomiting, blurred vision ? ?Physical Exam  ?BP (!) 197/84 (BP Location: Right Arm)   Pulse 72   Temp 98.2 ?F (36.8 ?C) (Oral)   Resp 12   Ht 5\' 6"  (1.676 m)   Wt 74.8 kg   SpO2 100%   BMI 26.63 kg/m?  ?Gen:   Awake, no distress   ?Resp:  Normal effort  ?MSK:   Moves extremities without difficulty  ?Other:  No wound noted to the left foot ? ?Medical Decision Making  ?Medically screening exam initiated at 8:37 PM.  Appropriate orders placed.  Dreonna Hussein was informed that the remainder of the evaluation will be completed by another provider, this initial triage assessment does not replace that evaluation, and the importance of remaining in the ED until their evaluation is complete. ? ? ?  ?[name], PA-C ?04/11/21 2040 ? ?

## 2021-04-11 NOTE — ED Triage Notes (Signed)
Complaining of a headache that started today has not taken anything for it. Complaining of left foot pain that has been ongoing since stepping on a toothpick a couple of weeks ago. Is a diabetic. ?

## 2021-04-12 NOTE — ED Provider Notes (Signed)
?[facility] DEPT [facility] ?Provider Note ?MRN:  [id number]  ?Arrival date & time: 04/12/21    ? ?Chief Complaint   ?Headache ?  ?History of Present Illness   ?[name] is a 76 y.o. year-old female with a history of diabetes, hypertension, stroke presenting to the ED with chief complaint of headache. ? ?Headache on and off for the past week.  Long history of headaches that she attributes to her blood pressure.  No numbness or weakness to the arms or legs.  Having some continued pain to the bottom of her left foot ever since stepping on a Q-tip that was on the ground.  Denies fever, no other complaints. ? ?Review of Systems  ?A thorough review of systems was obtained and all systems are negative except as noted in the HPI and PMH.  ? ?Patient's Health History   ? ?Past Medical History:  ?Diagnosis Date  ? Diabetes mellitus without complication (HCC)   ? GERD 09/04/2006  ? Qualifier: Diagnosis of  By: Duke SalviaGore, Denise    ? History of echocardiogram   ? a. 2D ECHO: 11/06/2013 EF 65%; no WMA. Mild TR. PA pk pressure 43 mm HG  ? Hypertension   ? Stroke [facility](HCC)   ?  ?Past Surgical History:  ?Procedure Laterality Date  ? ABDOMINAL HYSTERECTOMY    ? ANTERIOR CERVICAL DECOMPRESSION/DISCECTOMY FUSION 4 LEVELS N/A 11/11/2019  ? Procedure: Cervical three-four Cervical four-five Cervical five-six Cervical six-seven  Anterior cervical decompression/discectomy/fusion;  Surgeon: Maeola HarmanStern, Joseph, MD;  [facility];  Service: Neurosurgery;  Laterality: N/A;  ? LEFT HEART CATHETERIZATION WITH CORONARY ANGIOGRAM N/A 11/05/2013  ? Procedure: LEFT HEART CATHETERIZATION WITH CORONARY ANGIOGRAM;  Surgeon: Lennette Biharihomas A Kelly, MD;  [facility];  Service: Cardiovascular;  Laterality: N/A;  ?  ?History reviewed. No pertinent family history.  ?Social History  ? ?Socioeconomic History  ? Marital status: Widowed  ?  Spouse name: Not on file  ? Number of children: Not on file  ? Years of education: Not on file   ? Highest education level: Not on file  ?Occupational History  ? Not on file  ?Tobacco Use  ? Smoking status: Never  ? Smokeless tobacco: Never  ?Vaping Use  ? Vaping Use: Never used  ?Substance and Sexual Activity  ? Alcohol use: No  ? Drug use: No  ? Sexual activity: Not Currently  ?Other Topics Concern  ? Not on file  ?Social History Narrative  ? Not on file  ? ?Social Determinants of Health  ? ?Financial Resource Strain: Low Risk   ? Difficulty of Paying Living Expenses: Not hard at all  ?Food Insecurity: No Food Insecurity  ? Worried About Programme researcher, broadcasting/film/videounning Out of Food in the Last Year: Never true  ? Ran Out of Food in the Last Year: Never true  ?Transportation Needs: No Transportation Needs  ? Lack of Transportation (Medical): No  ? Lack of Transportation (Non-Medical): No  ?Physical Activity: Insufficiently Active  ? Days of Exercise per Week: 2 days  ? Minutes of Exercise per Session: 60 min  ?Stress: No Stress Concern Present  ? Feeling of Stress : Not at all  ?Social Connections: Socially Isolated  ? Frequency of Communication with Friends and Family: Three times a week  ? Frequency of Social Gatherings with Friends and Family: Never  ? Attends Religious Services: Never  ? Active Member of Clubs or Organizations: No  ? Attends BankerClub or Organization Meetings: Never  ? Marital Status: Widowed  ?  Intimate Partner Violence: Not At Risk  ? Fear of Current or Ex-Partner: No  ? Emotionally Abused: No  ? Physically Abused: No  ? Sexually Abused: No  ?  ? ?Physical Exam  ? ?Vitals:  ? 04/11/21 2334 04/11/21 2348  ?BP: (!) 177/72 (!) 172/97  ?Pulse: 66 74  ?Resp: (!) 1 16  ?Temp: 98 ?F (36.7 ?C)   ?SpO2: 99% 97%  ?  ?CONSTITUTIONAL: Well-appearing, NAD ?NEURO/PSYCH:  Alert and oriented x 3, normal and symmetric strength and sensation, normal coordination, normal speech ?EYES:  eyes equal and reactive ?ENT/NECK:  no LAD, no JVD ?CARDIO: Regular rate, well-perfused, normal S1 and S2 ?PULM:  CTAB no wheezing or rhonchi ?GI/GU:   non-distended, non-tender ?MSK/SPINE:  No gross deformities, no edema ?SKIN: Callus to the plantar aspect of the left foot at the second and third MTP, mildly tender ? ? ?*Additional and/or pertinent findings included in MDM below ? ?Diagnostic and Interventional Summary  ? ? EKG Interpretation ? ?Date/Time:    ?Ventricular Rate:    ?PR Interval:    ?QRS Duration:   ?QT Interval:    ?QTC Calculation:   ?R Axis:     ?Text Interpretation:   ?  ? ?  ? ?Labs Reviewed  ?CBC WITH DIFFERENTIAL/PLATELET - Abnormal; Notable for the following components:  ?    Result Value  ? RBC 3.41 (*)   ? Hemoglobin 10.4 (*)   ? HCT 32.3 (*)   ? All other components within normal limits  ?COMPREHENSIVE METABOLIC PANEL - Abnormal; Notable for the following components:  ? Potassium 3.3 (*)   ? Glucose, Bld 112 (*)   ? BUN 7 (*)   ? Calcium 8.5 (*)   ? Albumin 3.3 (*)   ? All other components within normal limits  ?  ?DG Foot Complete Left  ?Final Result  ?  ?CT Head Wo Contrast  ?Final Result  ?  ?  ?Medications - No data to display  ? ?Procedures  /  Critical Care ?Procedures ? ?ED Course and Medical Decision Making  ?Initial Impression and Ddx ?Favoring a benign headache given the reassuring neurological exam.  Arriving hypertensive, improving without intervention.  Triage obtain CT head that is without acute process.  Foot has a callus but there is no erythema, no increased warmth, normal range of motion, neurovascularly intact, triage x-ray is normal, nothing to suggest infection.  Recently treated with Keflex.  No emergent process, appropriate for discharge with PCP follow-up. ? ?Past medical/surgical history that increases complexity of ED encounter: Diabetes ? ?Interpretation of Diagnostics ?I personally reviewed the laboratory assessment and my interpretation is as follows: No significant blood count or electrolyte disturbance ?   ?See above for imaging interpretation. ? ?Patient Reassessment and Ultimate  Disposition/Management ?Discharge home ? ?Patient management required discussion with the following services or consulting groups:  None ? ?Complexity of Problems Addressed ?Acute illness or injury that poses threat of life of bodily function ? ?Additional Data Reviewed and Analyzed ?Further history obtained from: ?Past medical history and medications listed in the EMR ? ?Additional Factors Impacting ED Encounter Risk ?None ? ?Elmer Sow. Pilar Plate, MD ?[facility] Emergency Medicine ?[facility] [email or web address] ? ?Final Clinical Impressions(s) / ED Diagnoses  ? ?  ICD-10-CM   ?1. Nonintractable headache, unspecified chronicity pattern, unspecified headache type  R51.9   ?  ?2. Left foot pain  M79.672   ?  ?  ?ED Discharge Orders   ? ?  None  ? ?  ?  ? ?Discharge Instructions Discussed with and Provided to Patient:  ? ? ?Discharge Instructions   ? ?  ?You were evaluated in the Emergency Department and after careful evaluation, we did not find any emergent condition requiring admission or further testing in the hospital. ? ?Your exam/testing today was overall reassuring.  Recommend Tylenol and Motrin at home for headache or foot pain.  Recommend close follow-up with a primary care doctor. ? ?Please return to the Emergency Department if you experience any worsening of your condition.  Thank you for allowing [place] to be a part of your care. ? ? ? ? ?  ?Sabas Sous, MD ?04/12/21 0007 ? ?

## 2021-04-12 NOTE — Discharge Instructions (Signed)
You were evaluated in the Emergency Department and after careful evaluation, we did not find any emergent condition requiring admission or further testing in the hospital. ? ?Your exam/testing today was overall reassuring.  Recommend Tylenol and Motrin at home for headache or foot pain.  Recommend close follow-up with a primary care doctor. ? ?Please return to the Emergency Department if you experience any worsening of your condition.  Thank you for allowing [place] to be a part of your care. ? ?

## 2021-04-18 ENCOUNTER — Emergency Department (HOSPITAL_COMMUNITY)
Admission: EM | Admit: 2021-04-18 | Discharge: 2021-04-19 | Disposition: A | Discharge status: Home / Self Care | Payer: Medicare Other | Attending: Emergency Medicine | Admitting: Emergency Medicine

## 2021-04-18 ENCOUNTER — Other Ambulatory Visit: Payer: Self-pay

## 2021-04-18 ENCOUNTER — Encounter (HOSPITAL_COMMUNITY): Payer: Self-pay | Admitting: Emergency Medicine

## 2021-04-18 DIAGNOSIS — Z7982 Long term (current) use of aspirin: Secondary | ICD-10-CM | POA: Insufficient documentation

## 2021-04-18 DIAGNOSIS — Z79899 Other long term (current) drug therapy: Secondary | ICD-10-CM | POA: Insufficient documentation

## 2021-04-18 DIAGNOSIS — I160 Hypertensive urgency: Secondary | ICD-10-CM | POA: Insufficient documentation

## 2021-04-18 DIAGNOSIS — I10 Essential (primary) hypertension: Secondary | ICD-10-CM | POA: Insufficient documentation

## 2021-04-18 DIAGNOSIS — M79662 Pain in left lower leg: Secondary | ICD-10-CM | POA: Insufficient documentation

## 2021-04-18 DIAGNOSIS — Z7901 Long term (current) use of anticoagulants: Secondary | ICD-10-CM | POA: Insufficient documentation

## 2021-04-18 DIAGNOSIS — Z7902 Long term (current) use of antithrombotics/antiplatelets: Secondary | ICD-10-CM | POA: Insufficient documentation

## 2021-04-18 DIAGNOSIS — R519 Headache, unspecified: Secondary | ICD-10-CM | POA: Diagnosis present

## 2021-04-18 DIAGNOSIS — E119 Type 2 diabetes mellitus without complications: Secondary | ICD-10-CM | POA: Insufficient documentation

## 2021-04-18 DIAGNOSIS — Z7984 Long term (current) use of oral hypoglycemic drugs: Secondary | ICD-10-CM | POA: Insufficient documentation

## 2021-04-18 DIAGNOSIS — M79661 Pain in right lower leg: Secondary | ICD-10-CM | POA: Insufficient documentation

## 2021-04-18 DIAGNOSIS — G8929 Other chronic pain: Secondary | ICD-10-CM | POA: Insufficient documentation

## 2021-04-18 NOTE — ED Provider Triage Note (Signed)
Emergency Medicine Provider Triage Evaluation Note ? ?[name] , a 76 y.o. female  was evaluated in triage.  Pt complains of headache in the frontal region of her head. Ongoing for several days. Was not sudden onset. She has had similar headaches multiple times in the past. She was evaluated for the same symptoms earlier this week. She states her BP has also been elevated. She denies any chest pain. ? ?Review of Systems  ?Positive: headache ?Negative:  ? ?Physical Exam  ?BP (!) 185/76 (BP Location: Right Arm)   Pulse 76   Temp 98.3 ?F (36.8 ?C) (Oral)   Resp 16   SpO2 99%  ?Gen:   Awake, no distress   ?Resp:  Normal effort  ?MSK:   Moves extremities without difficulty  ?Other:  Normal neuro. Appears well. ? ?Medical Decision Making  ?Medically screening exam initiated at 9:08 PM.  Appropriate orders placed.  Monseratt Ledin was informed that the remainder of the evaluation will be completed by another provider, this initial triage assessment does not replace that evaluation, and the importance of remaining in the ED until their evaluation is complete. ? ? ?  ?Claudie Leach, PA-C ?04/18/21 2110 ? ?

## 2021-04-18 NOTE — ED Triage Notes (Addendum)
Pt reported to ED with of elevated BP causing headache. States "its all the medicine I'm on, its too much for one person to be on."   ?

## 2021-04-19 ENCOUNTER — Encounter (HOSPITAL_COMMUNITY): Payer: Self-pay | Admitting: Emergency Medicine

## 2021-04-19 ENCOUNTER — Encounter (HOSPITAL_COMMUNITY): Payer: Self-pay | Admitting: Student

## 2021-04-19 ENCOUNTER — Emergency Department (HOSPITAL_COMMUNITY)
Admission: EM | Admit: 2021-04-19 | Discharge: 2021-04-19 | Disposition: A | Discharge status: Home / Self Care | Payer: Medicare Other | Source: Home / Self Care | Attending: Emergency Medicine | Admitting: Emergency Medicine

## 2021-04-19 DIAGNOSIS — M79661 Pain in right lower leg: Secondary | ICD-10-CM | POA: Insufficient documentation

## 2021-04-19 DIAGNOSIS — Z7984 Long term (current) use of oral hypoglycemic drugs: Secondary | ICD-10-CM | POA: Insufficient documentation

## 2021-04-19 DIAGNOSIS — Z79899 Other long term (current) drug therapy: Secondary | ICD-10-CM | POA: Insufficient documentation

## 2021-04-19 DIAGNOSIS — E119 Type 2 diabetes mellitus without complications: Secondary | ICD-10-CM | POA: Insufficient documentation

## 2021-04-19 DIAGNOSIS — I10 Essential (primary) hypertension: Secondary | ICD-10-CM

## 2021-04-19 DIAGNOSIS — Z7982 Long term (current) use of aspirin: Secondary | ICD-10-CM | POA: Insufficient documentation

## 2021-04-19 DIAGNOSIS — M79662 Pain in left lower leg: Secondary | ICD-10-CM | POA: Insufficient documentation

## 2021-04-19 DIAGNOSIS — I160 Hypertensive urgency: Secondary | ICD-10-CM | POA: Insufficient documentation

## 2021-04-19 LAB — CBC WITH DIFFERENTIAL/PLATELET
Abs Immature Granulocytes: 0.02 10*3/uL (ref 0.00–0.07)
Basophils Absolute: 0 10*3/uL (ref 0.0–0.1)
Basophils Relative: 0 %
Eosinophils Absolute: 0.1 10*3/uL (ref 0.0–0.5)
Eosinophils Relative: 1 %
HCT: 32.1 % — ABNORMAL LOW (ref 36.0–46.0)
Hemoglobin: 10.2 g/dL — ABNORMAL LOW (ref 12.0–15.0)
Immature Granulocytes: 0 %
Lymphocytes Relative: 20 %
Lymphs Abs: 1.5 10*3/uL (ref 0.7–4.0)
MCH: 30.4 pg (ref 26.0–34.0)
MCHC: 31.8 g/dL (ref 30.0–36.0)
MCV: 95.5 fL (ref 80.0–100.0)
Monocytes Absolute: 0.6 10*3/uL (ref 0.1–1.0)
Monocytes Relative: 8 %
Neutro Abs: 5.3 10*3/uL (ref 1.7–7.7)
Neutrophils Relative %: 71 %
Platelets: 230 10*3/uL (ref 150–400)
RBC: 3.36 MIL/uL — ABNORMAL LOW (ref 3.87–5.11)
RDW: 13.5 % (ref 11.5–15.5)
WBC: 7.5 10*3/uL (ref 4.0–10.5)
nRBC: 0 % (ref 0.0–0.2)

## 2021-04-19 LAB — BASIC METABOLIC PANEL WITH GFR
Anion gap: 10 (ref 5–15)
BUN: 9 mg/dL (ref 8–23)
CO2: 26 mmol/L (ref 22–32)
Calcium: 8.6 mg/dL — ABNORMAL LOW (ref 8.9–10.3)
Chloride: 104 mmol/L (ref 98–111)
Creatinine, Ser: 1.06 mg/dL — ABNORMAL HIGH (ref 0.44–1.00)
GFR, Estimated: 55 mL/min — ABNORMAL LOW
Glucose, Bld: 121 mg/dL — ABNORMAL HIGH (ref 70–99)
Potassium: 3.5 mmol/L (ref 3.5–5.1)
Sodium: 140 mmol/L (ref 135–145)

## 2021-04-19 LAB — TROPONIN I (HIGH SENSITIVITY): Troponin I (High Sensitivity): 7 ng/L (ref <18)

## 2021-04-19 MED ORDER — CHLORTHALIDONE 25 MG PO TABS
25.0000 mg | ORAL_TABLET | Freq: Once | ORAL | Status: AC
  Administered 2021-04-19: 25 mg via ORAL
  Filled 2021-04-19: qty 1

## 2021-04-19 MED ORDER — ACETAMINOPHEN 325 MG PO TABS
650.0000 mg | ORAL_TABLET | Freq: Once | ORAL | Status: AC
  Administered 2021-04-19: 650 mg via ORAL
  Filled 2021-04-19: qty 2

## 2021-04-19 MED ORDER — ACETAMINOPHEN 500 MG PO TABS
1000.0000 mg | ORAL_TABLET | Freq: Once | ORAL | Status: AC
Start: 2021-04-19 — End: 2021-04-19
  Administered 2021-04-19: 1000 mg via ORAL
  Filled 2021-04-19: qty 2

## 2021-04-19 MED ORDER — LOSARTAN POTASSIUM 50 MG PO TABS
50.0000 mg | ORAL_TABLET | Freq: Once | ORAL | Status: AC
Start: 2021-04-19 — End: 2021-04-19
  Administered 2021-04-19: 50 mg via ORAL
  Filled 2021-04-19: qty 1

## 2021-04-19 MED ORDER — HYDRALAZINE HCL 25 MG PO TABS
50.0000 mg | ORAL_TABLET | Freq: Once | ORAL | Status: AC
  Administered 2021-04-19: 50 mg via ORAL
  Filled 2021-04-19: qty 2

## 2021-04-19 NOTE — ED Triage Notes (Signed)
Per pt, states her head and both legs hurt-states symptoms for about a week-Tylenol not helping ?

## 2021-04-19 NOTE — ED Notes (Signed)
Pt verbalized understanding of d/c instructions, meds, and followup care. Denies questions. VSS, no distress noted. Steady gait to exit with all belongings.  ?

## 2021-04-19 NOTE — ED Provider Triage Note (Signed)
Emergency Medicine Provider Triage Evaluation Note ? ?[name] , a 76 y.o. female  was evaluated in triage.  Patient presents for headache and high blood pressure. She was seen yesterday for the same. Negative workup yesterday. She is here often for similar symptoms. Has had numerous negative head CT scans and labs. Symptoms are not any different today.  ? ?Review of Systems  ?Positive:  ?Negative:  ? ?Physical Exam  ?There were no vitals taken for this visit. ?Gen:   Awake, no distress   ?Resp:  Normal effort  ?MSK:   Moves extremities without difficulty  ?Other:   ? ?Medical Decision Making  ?Medically screening exam initiated at 8:07 PM.  Appropriate orders placed.  Joon Kable was informed that the remainder of the evaluation will be completed by another provider, this initial triage assessment does not replace that evaluation, and the importance of remaining in the ED until their evaluation is complete. ? ?BP 197/70 ? ? ?  ?Adolphus Birchwood, PA-C ?04/19/21 2008 ? ?

## 2021-04-19 NOTE — ED Triage Notes (Signed)
Pt c/o headache, says her BP is "crazy, close to 200." Pt denies blurred vision, N/V, no additional complaints.  ?

## 2021-04-19 NOTE — ED Provider Notes (Signed)
?[facility]-EMERGENCY DEPT ?Provider Note ? ? ?CSN: [id number] ?Arrival date & time: 04/19/21  1147 ? ?  ? ?History ? ?Chief Complaint  ?Patient presents with  ? Headache  ? Leg Pain  ? ? ?[name] is a 76 y.o. female. ? ?HPI ?Patient with multiple medical issues presents with concern of ongoing generalized discomfort and hypertension. ?Patient cannot provide a discrete history of when her symptoms change from baseline, but it seems as though she is concerned about her hypertension, and pain, in the lower extremities.  These are seemingly unchanged have been present for some time.  She has been seen and evaluated 33 times in the past 6 months. ?  ? ?Home Medications ?Prior to Admission medications   ?Medication Sig Start Date End Date Taking? Authorizing Provider  ?acetaminophen (TYLENOL) 500 MG tablet Take 2 tablets (1,000 mg total) by mouth every 6 (six) hours as needed. 03/30/21   Arby Barrette, MD  ?albuterol (VENTOLIN HFA) 108 (90 Base) MCG/ACT inhaler Inhale 1-2 puffs into the lungs every 6 (six) hours as needed for wheezing or shortness of breath. 04/22/20   Barbette Merino, NP  ?aspirin EC 81 MG tablet Take 81 mg by mouth daily. Swallow whole.    [provider]  ?cephALEXin (KEFLEX) 500 MG capsule Take 1 capsule (500 mg total) by mouth 4 (four) times daily. 03/28/21   Ernie Avena, MD  ?chlorthalidone (HYGROTON) 25 MG tablet Take 1 tablet (25 mg total) by mouth daily. 10/05/20 10/05/21  Barbette Merino, NP  ?clopidogrel (PLAVIX) 75 MG tablet Take 1 tablet (75 mg total) by mouth daily. 04/22/20   Barbette Merino, NP  ?diclofenac Sodium (VOLTAREN) 1 % GEL Apply 2 g topically 4 (four) times daily as needed. 03/23/21   [name], MD  ?diphenhydramine-acetaminophen (TYLENOL PM) 25-500 MG TABS tablet Take 0.5 tablets by mouth at bedtime as needed. 1/2 tablet    [provider]  ?gabapentin (NEURONTIN) 300 MG capsule Take 1 capsule (300 mg total) by mouth 3 (three) times  daily. ?Patient taking differently: Take 300 mg by mouth daily. 10/05/20   Barbette Merino, NP  ?gatifloxacin (ZYMAXID) 0.5 % SOLN Place 1 drop into the right eye 4 (four) times daily. 06/28/20   [provider]  ?glipiZIDE (GLUCOTROL) 5 MG tablet Take 1 tablet (5 mg total) by mouth 2 (two) times daily before a meal. 04/22/20 04/22/21  Barbette Merino, NP  ?hydrALAZINE (APRESOLINE) 50 MG tablet Take 1 tablet (50 mg total) by mouth 2 (two) times daily. ?Patient taking differently: Take 50 mg by mouth daily. 04/22/20   Barbette Merino, NP  ?ketorolac (ACULAR) 0.5 % ophthalmic solution Place 1 drop into the right eye 4 (four) times daily. ?Patient not taking: Reported on 02/01/2021 06/28/20   [provider]  ?lidocaine (LIDODERM) 5 % Place 1 patch onto the skin daily. Remove & Discard patch within 12 hours or as directed by MD 01/10/21   Arthor Captain, PA-C  ?losartan (COZAAR) 50 MG tablet Take 1 tablet (50 mg total) by mouth daily. 10/05/20 10/05/21  Barbette Merino, NP  ?metFORMIN (GLUCOPHAGE) 500 MG tablet Take 2 tablets (1,000 mg total) by mouth 2 (two) times daily with a meal. ?Patient taking differently: Take 1,000 mg by mouth daily with breakfast. 10/05/20 10/05/21  Barbette Merino, NP  ?metoCLOPramide (REGLAN) 10 MG tablet Take 1 tablet (10 mg total) by mouth every 6 (six) hours as needed for nausea (nausea/headache). 03/23/21  [name], MD  ?mirabegron ER (MYRBETRIQ) 25 MG TB24 tablet Take 25 mg by mouth daily.    [provider]  ?naproxen (NAPROSYN) 250 MG tablet Take 1 tablet (250 mg total) by mouth 2 (two) times daily with a meal. 02/05/21   Linwood Dibbles, MD  ?[facility] VERIO test strip Use 1-4 times daily as directed/needed   DX E11.9 ?Patient not taking: No sig reported 04/25/20   Barbette Merino, NP  ?potassium chloride (KLOR-CON M) 20 MEQ tablet Take 0.5 tablets (10 mEq total) by mouth daily. 02/25/21   Dione Booze, MD  ?prednisoLONE acetate (PRED FORTE) 1 % ophthalmic suspension Place  1 drop into the right eye 4 (four) times daily. ?Patient not taking: Reported on 02/01/2021 06/28/20   [provider]  ?predniSONE (DELTASONE) 50 MG tablet Take 1 tablet (50 mg total) by mouth daily. 03/10/21   Dione Booze, MD  ?rosuvastatin (CRESTOR) 10 MG tablet Take 1 tablet (10 mg total) by mouth at bedtime. 10/05/20 10/05/21  Barbette Merino, NP  ?traMADol (ULTRAM) 50 MG tablet Take 1 tablet (50 mg total) by mouth every 6 (six) hours as needed. 1-2 tablets every 6 hours as needed for pain 03/30/21   Arby Barrette, MD  ?   ? ?Allergies    ?Codeine, Ibuprofen, and Imdur [isosorbide nitrate]   ? ?Review of Systems   ?Review of Systems  ?Constitutional:   ?     Per HPI, otherwise negative  ?HENT:    ?     Per HPI, otherwise negative  ?Respiratory:    ?     Per HPI, otherwise negative  ?Cardiovascular:   ?     Per HPI, otherwise negative  ?Gastrointestinal:  Negative for vomiting.  ?Endocrine:  ?     Negative aside from HPI  ?Genitourinary:   ?     Neg aside from HPI   ?Musculoskeletal:   ?     Per HPI, otherwise negative  ?Skin: Negative.   ?Neurological:  Negative for syncope.  ? ?Physical Exam ?Updated Vital Signs ?BP (!) 199/82   Pulse 69   Temp 97.9 ?F (36.6 ?C) (Oral)   Resp 16   SpO2 99%  ?Physical Exam ?Vitals and nursing note reviewed.  ?Constitutional:   ?   General: She is not in acute distress. ?   Appearance: She is well-developed.  ?HENT:  ?   Head: Normocephalic and atraumatic.  ?Eyes:  ?   Conjunctiva/sclera: Conjunctivae normal.  ?Cardiovascular:  ?   Rate and Rhythm: Normal rate and regular rhythm.  ?Pulmonary:  ?   Effort: Pulmonary effort is normal. No respiratory distress.  ?   Breath sounds: Normal breath sounds. No stridor.  ?Abdominal:  ?   General: There is no distension.  ?Skin: ?   General: Skin is warm and dry.  ?Neurological:  ?   Mental Status: She is alert and oriented to person, place, and time.  ?   Cranial Nerves: No cranial nerve deficit or dysarthria.  ?Psychiatric:     ?    Mood and Affect: Mood normal.  ?   Comments: No insight into her repeat presentations, pleasantly interactive  ? ? ?ED Results / Procedures / Treatments   ?Labs ?(all labs ordered are listed, but only abnormal results are displayed) ?Labs Reviewed - No data to display ? ?EKG ?None ? ?Radiology ?No results found. ? ?Procedures ?Procedures  ? ? ?Medications Ordered in ED ?Medications  ?acetaminophen (TYLENOL) tablet 1,000  mg (1,000 mg Oral Given 04/19/21 1345)  ? ? ?ED Course/ Medical Decision Making/ A&P ?This patient with a Hx of multiple medical issues including hypertension, chronic pain, sciatica presents to the ED for concern of pain hypertension, this involves an extensive number of treatment options, and is a complaint that carries with it a high risk of complications and morbidity.   ? ?The differential diagnosis includes hypertensive urgency, acute on chronic pain, dissection, infection, ? ? ?Social Determinants of Health: ? ?Age ? ?Additional history obtained: ? ?Additional history and/or information obtained from chart review, notable for 4 head CTs within the past 6 weeks, none with notable findings ? ? ?After the initial evaluation, orders, including: Analgesics were initiated. ? ? ?Patient placed on Cardiac and Pulse-Oximetry Monitors. ?The patient was maintained on a cardiac monitor.  The cardiac monitored showed an rhythm of 70 sinus normal ?The patient was also maintained on pulse oximetry. The readings were typically 100% room air normal ? ? ?On repeat evaluation of the patient improved ? ? ?Dispostion / Final MDM: ? ?After consideration of the diagnostic results and the patient's response to treatment, patient is appropriate for discharge.  She is mildly hypertensive, but today's physical exam is reassuring, no evidence for hypertensive crisis, multiple prior radiographic studies including 2 menses with the past 2 weeks, multiple chest x-rays reassuring, no evidence for endorgan damage, labs  similarly consistent.  Patient encouraged to follow-up with primary care. ? ? ?Final Clinical Impression(s) / ED Diagnoses ?Final diagnoses:  ?Hypertensive urgency  ? ?  ?Gerhard MunchLockwood, Jennipher Weatherholtz, MD ?04/19/21 1354 ?

## 2021-04-19 NOTE — Discharge Instructions (Signed)
In regards to your headache tonight, this appears chronic.  You do not have any concerning symptoms which would make [place] more concerned.  It could be due to your blood pressure being high.  You were given blood pressure medicine in the emergency department.  You are also given Tylenol which you may use at home as directed on packaging for headache.  It is very important you follow-up with your primary care doctor to discuss blood pressure control as well as your chronic headaches. ?

## 2021-04-19 NOTE — ED Notes (Signed)
Pt refused to change into gown and states that during all her visits she "doesn't have to take nothing off". ?

## 2021-04-19 NOTE — ED Provider Notes (Signed)
?[facility] EMERGENCY DEPARTMENT ?Provider Note ? ? ?CSN: [id number] ?Arrival date & time: 04/19/21  1953 ? ?  ? ?History ? ?Chief Complaint  ?Patient presents with  ? Headache  ? ? ?[name] is a 76 y.o. female with a history of hypertension, diabetes, prior stroke, and CAD who presents to the emergency department with concerns about her blood pressure and headache.  Patient states her blood pressure has been high close to 200s.  She reports that she is taking her blood pressure medication as prescribed but is unable to tell me what medication she takes.  She reports a mild headache as well as some generalized pain.  She is mostly asking if we have any food and for a blanket.  She denies diplopia, loss of vision, acute numbness, acute weakness, chest pain, shortness of breath, dizziness, or syncope. ? ?HPI ? ?  ? ?Home Medications ?Prior to Admission medications   ?Medication Sig Start Date End Date Taking? Authorizing Provider  ?acetaminophen (TYLENOL) 500 MG tablet Take 2 tablets (1,000 mg total) by mouth every 6 (six) hours as needed. 03/30/21   Arby Barrette, MD  ?albuterol (VENTOLIN HFA) 108 (90 Base) MCG/ACT inhaler Inhale 1-2 puffs into the lungs every 6 (six) hours as needed for wheezing or shortness of breath. 04/22/20   Barbette Merino, NP  ?aspirin EC 81 MG tablet Take 81 mg by mouth daily. Swallow whole.    [provider]  ?cephALEXin (KEFLEX) 500 MG capsule Take 1 capsule (500 mg total) by mouth 4 (four) times daily. 03/28/21   Ernie Avena, MD  ?chlorthalidone (HYGROTON) 25 MG tablet Take 1 tablet (25 mg total) by mouth daily. 10/05/20 10/05/21  Barbette Merino, NP  ?clopidogrel (PLAVIX) 75 MG tablet Take 1 tablet (75 mg total) by mouth daily. 04/22/20   Barbette Merino, NP  ?diclofenac Sodium (VOLTAREN) 1 % GEL Apply 2 g topically 4 (four) times daily as needed. 03/23/21   [name], MD  ?diphenhydramine-acetaminophen (TYLENOL PM) 25-500 MG TABS tablet Take 0.5 tablets  by mouth at bedtime as needed. 1/2 tablet    [provider]  ?gabapentin (NEURONTIN) 300 MG capsule Take 1 capsule (300 mg total) by mouth 3 (three) times daily. ?Patient taking differently: Take 300 mg by mouth daily. 10/05/20   Barbette Merino, NP  ?gatifloxacin (ZYMAXID) 0.5 % SOLN Place 1 drop into the right eye 4 (four) times daily. 06/28/20   [provider]  ?glipiZIDE (GLUCOTROL) 5 MG tablet Take 1 tablet (5 mg total) by mouth 2 (two) times daily before a meal. 04/22/20 04/22/21  Barbette Merino, NP  ?hydrALAZINE (APRESOLINE) 50 MG tablet Take 1 tablet (50 mg total) by mouth 2 (two) times daily. ?Patient taking differently: Take 50 mg by mouth daily. 04/22/20   Barbette Merino, NP  ?ketorolac (ACULAR) 0.5 % ophthalmic solution Place 1 drop into the right eye 4 (four) times daily. ?Patient not taking: Reported on 02/01/2021 06/28/20   [provider]  ?lidocaine (LIDODERM) 5 % Place 1 patch onto the skin daily. Remove & Discard patch within 12 hours or as directed by MD 01/10/21   Arthor Captain, PA-C  ?losartan (COZAAR) 50 MG tablet Take 1 tablet (50 mg total) by mouth daily. 10/05/20 10/05/21  Barbette Merino, NP  ?metFORMIN (GLUCOPHAGE) 500 MG tablet Take 2 tablets (1,000 mg total) by mouth 2 (two) times daily with a meal. ?Patient taking differently: Take 1,000 mg by mouth daily  with breakfast. 10/05/20 10/05/21  Barbette MerinoKing, Crystal M, NP  ?metoCLOPramide (REGLAN) 10 MG tablet Take 1 tablet (10 mg total) by mouth every 6 (six) hours as needed for nausea (nausea/headache). 03/23/21   Charlynne PanderYao, David Hsienta, MD  ?mirabegron ER (MYRBETRIQ) 25 MG TB24 tablet Take 25 mg by mouth daily.    [provider]  ?naproxen (NAPROSYN) 250 MG tablet Take 1 tablet (250 mg total) by mouth 2 (two) times daily with a meal. 02/05/21   Linwood DibblesKnapp, Jon, MD  [facility] Use 1-4 times daily as directed/needed   DX E11.9 ?Patient not taking: No sig reported 04/25/20   Barbette MerinoKing, Crystal M, NP  ?potassium chloride  (KLOR-CON M) 20 MEQ tablet Take 0.5 tablets (10 mEq total) by mouth daily. 02/25/21   Dione BoozeGlick, David, MD  ?prednisoLONE acetate (PRED FORTE) 1 % ophthalmic suspension Place 1 drop into the right eye 4 (four) times daily. ?Patient not taking: Reported on 02/01/2021 06/28/20   [provider]  ?predniSONE (DELTASONE) 50 MG tablet Take 1 tablet (50 mg total) by mouth daily. 03/10/21   Dione BoozeGlick, David, MD  ?rosuvastatin (CRESTOR) 10 MG tablet Take 1 tablet (10 mg total) by mouth at bedtime. 10/05/20 10/05/21  Barbette MerinoKing, Crystal M, NP  ?traMADol (ULTRAM) 50 MG tablet Take 1 tablet (50 mg total) by mouth every 6 (six) hours as needed. 1-2 tablets every 6 hours as needed for pain 03/30/21   Arby BarrettePfeiffer, Marcy, MD  ?   ? ?Allergies    ?Codeine, Ibuprofen, and Imdur [isosorbide nitrate]   ? ?Review of Systems   ?Review of Systems  ?Constitutional:  Negative for chills and fever.  ?Eyes:  Negative for visual disturbance.  ?Respiratory:  Negative for shortness of breath.   ?Cardiovascular:  Negative for chest pain.  ?Gastrointestinal:  Negative for vomiting.  ?Neurological:  Positive for headaches. Negative for syncope, weakness and numbness.  ?All other systems reviewed and are negative. ? ?Physical Exam ?Updated Vital Signs ?BP (!) 188/82   Pulse 80   Temp 98.6 ?F (37 ?C) (Oral)   Resp 20   SpO2 100%  ?Physical Exam ?Vitals and nursing note reviewed.  ?Constitutional:   ?   General: She is not in acute distress. ?   Appearance: She is well-developed. She is not toxic-appearing.  ?HENT:  ?   Head: Normocephalic and atraumatic.  ?Eyes:  ?   General: Vision grossly intact. Gaze aligned appropriately.     ?   Right eye: No discharge.     ?   Left eye: No discharge.  ?   Extraocular Movements: Extraocular movements intact.  ?   Conjunctiva/sclera: Conjunctivae normal.  ?   Comments: PERRL. No proptosis.   ?Cardiovascular:  ?   Rate and Rhythm: Normal rate and regular rhythm.  ?   Pulses:     ?     Radial pulses are 2+ on the right side and  2+ on the left side.  ?Pulmonary:  ?   Effort: Pulmonary effort is normal. No respiratory distress.  ?   Breath sounds: Normal breath sounds. No wheezing, rhonchi or rales.  ?Abdominal:  ?   General: There is no distension.  ?   Palpations: Abdomen is soft.  ?   Tenderness: There is no abdominal tenderness.  ?Musculoskeletal:  ?   Cervical back: Normal range of motion and neck supple. No rigidity.  ?Skin: ?   General: Skin is warm and dry.  ?   Findings: No rash.  ?  Neurological:  ?   Comments: Alert. Clear speech. No facial droop. CNIII-XII grossly intact. Able to detect light touch throughout. 5/5 symmetric strength with grip strength and with plantar and dorsi flexion bilaterally . Normal finger to nose bilaterally. Ambulatory.   ?Psychiatric:     ?   Behavior: Behavior normal.  ? ? ?ED Results / Procedures / Treatments   ?Labs ?(all labs ordered are listed, but only abnormal results are displayed) ?Labs Reviewed  ?BASIC METABOLIC PANEL - Abnormal; Notable for the following components:  ?    Result Value  ? Glucose, Bld 121 (*)   ? Creatinine, Ser 1.06 (*)   ? Calcium 8.6 (*)   ? GFR, Estimated 55 (*)   ? All other components within normal limits  ?CBC WITH DIFFERENTIAL/PLATELET - Abnormal; Notable for the following components:  ? RBC 3.36 (*)   ? Hemoglobin 10.2 (*)   ? HCT 32.1 (*)   ? All other components within normal limits  ?TROPONIN I (HIGH SENSITIVITY)  ?TROPONIN I (HIGH SENSITIVITY)  ? ? ?EKG ?None ? ?Radiology ?No results found. ? ?Procedures ?Procedures  ? ? ?Medications Ordered in ED ?Medications  ?acetaminophen (TYLENOL) tablet 650 mg (650 mg Oral Given 04/19/21 2346)  ?hydrALAZINE (APRESOLINE) tablet 50 mg (50 mg Oral Given 04/19/21 2346)  ? ? ?ED Course/ Medical Decision Making/ A&P ?  ?                        ?Medical Decision Making ?Risk ?OTC drugs. ?Prescription drug management. ? ? ?Patient presents to the emergency department with complaints of headache and hypertension.  She is nontoxic,  resting comfortably, her vitals are notable for elevated blood pressure which seems similar to her prior on record. ? ?Chart reviewed for additional history, patient with 35 ED visits in the past 6 months.  She w

## 2021-04-19 NOTE — ED Provider Notes (Signed)
?[facility] EMERGENCY DEPARTMENT ?Provider Note ? ? ?CSN: [id number] ?Arrival date & time: 04/18/21  2053 ? ?  ? ?History ? ?Chief Complaint  ?Patient presents with  ? Hypertension  ? ? ?[name] is a 76 y.o. female. ? ?Patient with history of frequent emergency department visits, hypertension on multiple antihypertensive medications, poorly compliant, diabetes, previous stroke on Plavix --presents to the emergency department for evaluation of headache.  She describes frontal headache for several days.  No injuries.  Pain came on gradually.  She denies treatments for this.  This is a typical complaint when the patient comes to the emergency department.  She also complains of her blood pressure being elevated.  No associated chest pain, nausea or vomiting, vision change.  She states that she gets headaches when her blood pressure is running high.  She cannot currently tell me what blood pressure medications she is taking or how many different medication she takes.  She refers me back to her chart.   ? ? ?  ? ?Home Medications ?Prior to Admission medications   ?Medication Sig Start Date End Date Taking? Authorizing Provider  ?acetaminophen (TYLENOL) 500 MG tablet Take 2 tablets (1,000 mg total) by mouth every 6 (six) hours as needed. 03/30/21   Arby Barrette, MD  ?albuterol (VENTOLIN HFA) 108 (90 Base) MCG/ACT inhaler Inhale 1-2 puffs into the lungs every 6 (six) hours as needed for wheezing or shortness of breath. 04/22/20   Barbette Merino, NP  ?aspirin EC 81 MG tablet Take 81 mg by mouth daily. Swallow whole.    [provider]  ?cephALEXin (KEFLEX) 500 MG capsule Take 1 capsule (500 mg total) by mouth 4 (four) times daily. 03/28/21   Ernie Avena, MD  ?chlorthalidone (HYGROTON) 25 MG tablet Take 1 tablet (25 mg total) by mouth daily. 10/05/20 10/05/21  Barbette Merino, NP  ?clopidogrel (PLAVIX) 75 MG tablet Take 1 tablet (75 mg total) by mouth daily. 04/22/20   Barbette Merino, NP   ?diclofenac Sodium (VOLTAREN) 1 % GEL Apply 2 g topically 4 (four) times daily as needed. 03/23/21   [name], MD  ?diphenhydramine-acetaminophen (TYLENOL PM) 25-500 MG TABS tablet Take 0.5 tablets by mouth at bedtime as needed. 1/2 tablet    [provider]  ?gabapentin (NEURONTIN) 300 MG capsule Take 1 capsule (300 mg total) by mouth 3 (three) times daily. ?Patient taking differently: Take 300 mg by mouth daily. 10/05/20   Barbette Merino, NP  ?gatifloxacin (ZYMAXID) 0.5 % SOLN Place 1 drop into the right eye 4 (four) times daily. 06/28/20   [provider]  ?glipiZIDE (GLUCOTROL) 5 MG tablet Take 1 tablet (5 mg total) by mouth 2 (two) times daily before a meal. 04/22/20 04/22/21  Barbette Merino, NP  ?hydrALAZINE (APRESOLINE) 50 MG tablet Take 1 tablet (50 mg total) by mouth 2 (two) times daily. ?Patient taking differently: Take 50 mg by mouth daily. 04/22/20   Barbette Merino, NP  ?ketorolac (ACULAR) 0.5 % ophthalmic solution Place 1 drop into the right eye 4 (four) times daily. ?Patient not taking: Reported on 02/01/2021 06/28/20   [provider]  ?lidocaine (LIDODERM) 5 % Place 1 patch onto the skin daily. Remove & Discard patch within 12 hours or as directed by MD 01/10/21   Arthor Captain, PA-C  ?losartan (COZAAR) 50 MG tablet Take 1 tablet (50 mg total) by mouth daily. 10/05/20 10/05/21  Barbette Merino, NP  ?metFORMIN (GLUCOPHAGE) 500 MG  tablet Take 2 tablets (1,000 mg total) by mouth 2 (two) times daily with a meal. ?Patient taking differently: Take 1,000 mg by mouth daily with breakfast. 10/05/20 10/05/21  Barbette Merino, NP  ?metoCLOPramide (REGLAN) 10 MG tablet Take 1 tablet (10 mg total) by mouth every 6 (six) hours as needed for nausea (nausea/headache). 03/23/21   [name], MD  ?mirabegron ER (MYRBETRIQ) 25 MG TB24 tablet Take 25 mg by mouth daily.    [provider]  ?naproxen (NAPROSYN) 250 MG tablet Take 1 tablet (250 mg total) by mouth 2 (two) times daily  with a meal. 02/05/21   Linwood Dibbles, MD  ?[facility] VERIO test strip Use 1-4 times daily as directed/needed   DX E11.9 ?Patient not taking: No sig reported 04/25/20   Barbette Merino, NP  ?potassium chloride (KLOR-CON M) 20 MEQ tablet Take 0.5 tablets (10 mEq total) by mouth daily. 02/25/21   Dione Booze, MD  ?prednisoLONE acetate (PRED FORTE) 1 % ophthalmic suspension Place 1 drop into the right eye 4 (four) times daily. ?Patient not taking: Reported on 02/01/2021 06/28/20   [provider]  ?predniSONE (DELTASONE) 50 MG tablet Take 1 tablet (50 mg total) by mouth daily. 03/10/21   Dione Booze, MD  ?rosuvastatin (CRESTOR) 10 MG tablet Take 1 tablet (10 mg total) by mouth at bedtime. 10/05/20 10/05/21  Barbette Merino, NP  ?traMADol (ULTRAM) 50 MG tablet Take 1 tablet (50 mg total) by mouth every 6 (six) hours as needed. 1-2 tablets every 6 hours as needed for pain 03/30/21   Arby Barrette, MD  ?   ? ?Allergies    ?Codeine, Ibuprofen, and Imdur [isosorbide nitrate]   ? ?Review of Systems   ?Review of Systems ? ?Physical Exam ?Updated Vital Signs ?BP (!) 205/72   Pulse 71   Temp 98.3 ?F (36.8 ?C) (Oral)   Resp 17   SpO2 98%  ?Physical Exam ?Vitals and nursing note reviewed.  ?Constitutional:   ?   Appearance: She is well-developed.  ?HENT:  ?   Head: Normocephalic and atraumatic.  ?   Right Ear: Tympanic membrane, ear canal and external ear normal.  ?   Left Ear: Tympanic membrane, ear canal and external ear normal.  ?   Nose: Nose normal.  ?   Mouth/Throat:  ?   Pharynx: Uvula midline.  ?Eyes:  ?   General: Lids are normal.  ?   Extraocular Movements:  ?   Right eye: No nystagmus.  ?   Left eye: No nystagmus.  ?   Conjunctiva/sclera: Conjunctivae normal.  ?   Pupils: Pupils are equal, round, and reactive to light.  ?Cardiovascular:  ?   Rate and Rhythm: Normal rate and regular rhythm.  ?Pulmonary:  ?   Effort: Pulmonary effort is normal.  ?   Breath sounds: Normal breath sounds.  ?Abdominal:  ?   Palpations:  Abdomen is soft.  ?   Tenderness: There is no abdominal tenderness.  ?Musculoskeletal:  ?   Cervical back: Normal range of motion and neck supple. No tenderness or bony tenderness.  ?Skin: ?   General: Skin is warm and dry.  ?Neurological:  ?   Mental Status: She is alert and oriented to person, place, and time.  ?   GCS: GCS eye subscore is 4. GCS verbal subscore is 5. GCS motor subscore is 6.  ?   Cranial Nerves: No cranial nerve deficit.  ?   Sensory: No sensory deficit.  ?  Motor: No weakness.  ?   Coordination: Coordination normal.  ?   Comments: Upper extremity myotomes tested bilaterally:  ?C5 Shoulder abduction 5/5 ?C6 Elbow flexion/wrist extension 5/5 ?C7 Elbow extension 5/5 ?C8 Finger flexion 5/5 ?T1 Finger abduction 5/5 ? ?Lower extremity myotomes tested bilaterally: ?L2 Hip flexion 5/5 ?L3 Knee extension 5/5 ?L4 Ankle dorsiflexion 5/5 ?S1 Ankle plantar flexion 5/5 ?  ? ? ?ED Results / Procedures / Treatments   ?Labs ?(all labs ordered are listed, but only abnormal results are displayed) ?Labs Reviewed - No data to display ? ?EKG ?None ? ?Radiology ?No results found. ? ?Procedures ?Procedures  ? ? ?Medications Ordered in ED ?Medications  ?chlorthalidone (HYGROTON) tablet 25 mg (has no administration in time range)  ?losartan (COZAAR) tablet 50 mg (has no administration in time range)  ?acetaminophen (TYLENOL) tablet 650 mg (has no administration in time range)  ? ? ?ED Course/ Medical Decision Making/ A&P ?  ? ?Patient seen and examined. History obtained directly from patient.  I did review a clinic note from the second half of 2022 as well.  Typical pattern of frequent emergency department visits continues.  Patient has had 14 CTs of the head in the past 12 months.  Today she seems to be neurologically intact.  She is speaking clearly.  She is moving all extremities.  She moves her neck without any problems.  Low concern for intracranial bleed at this time.  Will give losartan and chlorthalidone for  blood pressure as well as Tylenol for headache. ? ?Most recent vital signs reviewed and are as follows: ?BP (!) 205/72   Pulse 71   Temp 98.3 ?F (36.8 ?C) (Oral)   Resp 17   SpO2 98%  ? ?Initial impression: Chronic headac

## 2021-04-19 NOTE — Discharge Instructions (Signed)
Your blood pressure was elevated in the emergency department today.  It is essential that you follow-up with your primary care provider to discuss further management of your hypertension.  Please take all of your medications as prescribed.  Your blood work all was similar to prior labs you have had done with mild anemia and mildly elevated creatinine, please discuss with primary care as well. ? ?Follow-up with primary care soon as possible.  Return to the ER for new or worsening symptoms or any other concerns. ?

## 2021-04-19 NOTE — Discharge Instructions (Signed)
As discussed, your evaluation today has been largely reassuring.  But, it is important that you monitor your condition carefully, and do not hesitate to return to the ED if you develop new, or concerning changes in your condition. ? ?Otherwise, please follow-up with your physician for appropriate ongoing care. ? ?

## 2021-04-24 ENCOUNTER — Other Ambulatory Visit: Payer: Self-pay

## 2021-04-24 ENCOUNTER — Emergency Department (HOSPITAL_COMMUNITY)
Admission: EM | Admit: 2021-04-24 | Discharge: 2021-04-25 | Disposition: A | Discharge status: Home / Self Care | Payer: Medicare Other | Attending: Emergency Medicine | Admitting: Emergency Medicine

## 2021-04-24 DIAGNOSIS — Z7982 Long term (current) use of aspirin: Secondary | ICD-10-CM | POA: Diagnosis not present

## 2021-04-24 DIAGNOSIS — I10 Essential (primary) hypertension: Secondary | ICD-10-CM | POA: Diagnosis not present

## 2021-04-24 DIAGNOSIS — M79605 Pain in left leg: Secondary | ICD-10-CM

## 2021-04-24 DIAGNOSIS — R519 Headache, unspecified: Secondary | ICD-10-CM | POA: Diagnosis present

## 2021-04-24 DIAGNOSIS — Z7902 Long term (current) use of antithrombotics/antiplatelets: Secondary | ICD-10-CM | POA: Insufficient documentation

## 2021-04-24 DIAGNOSIS — Z79899 Other long term (current) drug therapy: Secondary | ICD-10-CM | POA: Insufficient documentation

## 2021-04-24 DIAGNOSIS — R600 Localized edema: Secondary | ICD-10-CM | POA: Insufficient documentation

## 2021-04-24 NOTE — ED Triage Notes (Signed)
Pt here for L knee pain, states her legs are hurting and it is hard to walk. Pt denies falls/injuries, says tylenol "doesn't help anybody" ?

## 2021-04-24 NOTE — ED Provider Triage Note (Signed)
Emergency Medicine Provider Triage Evaluation Note ? ?[name] , a 76 y.o. female  was evaluated in triage.  Pt complains of bad headache and knee pain.  Ongoing for "a while", seen frequently for same.  Denies recent falls or trauma.  No meds taken PTA. ? ?Review of Systems  ?Positive: Headache, knee pain ?Negative: fever ? ?Physical Exam  ?BP (!) 213/80 (BP Location: Right Arm)   Pulse 74   Temp 98.2 ?F (36.8 ?C) (Oral)   Resp 16   SpO2 99%  ? ?Gen:   Awake, no distress   ?Resp:  Normal effort  ?MSK:   Moves extremities without difficulty, ambulatory without difficulty ?Other:   ? ?Medical Decision Making  ?Medically screening exam initiated at 10:12 PM.  Appropriate orders placed.  [name] was informed that the remainder of the evaluation will be completed by another provider, this initial triage assessment does not replace that evaluation, and the importance of remaining in the ED until their evaluation is complete. ? ?Knee pain, headache.  Many frequent visits for same.  Hypertensive which is also normal for her when in the ED due to medication non-compliance. ?  ?Garlon Hatchet, PA-C ?04/24/21 2219 ? ?

## 2021-04-25 ENCOUNTER — Emergency Department (HOSPITAL_COMMUNITY): Payer: Medicare Other

## 2021-04-25 DIAGNOSIS — I10 Essential (primary) hypertension: Secondary | ICD-10-CM | POA: Diagnosis not present

## 2021-04-25 MED ORDER — HYDRALAZINE HCL 25 MG PO TABS
50.0000 mg | ORAL_TABLET | Freq: Once | ORAL | Status: AC
  Administered 2021-04-25: 50 mg via ORAL
  Filled 2021-04-25: qty 2

## 2021-04-25 MED ORDER — OXYCODONE-ACETAMINOPHEN 5-325 MG PO TABS
1.0000 | ORAL_TABLET | Freq: Once | ORAL | Status: AC
  Administered 2021-04-25: 1 via ORAL
  Filled 2021-04-25: qty 1

## 2021-04-25 NOTE — ED Notes (Signed)
Patient transported to X-ray 

## 2021-04-25 NOTE — ED Notes (Signed)
Pt able to ambulate to the bathroom with minimal assistance  ?

## 2021-04-25 NOTE — ED Notes (Signed)
Pt able to ambulate out and educated on continuing her BP medications at home  ?

## 2021-04-25 NOTE — ED Provider Notes (Signed)
?[facility] EMERGENCY DEPARTMENT ?Provider Note ? ? ?CSN: [id number] ?Arrival date & time: 04/24/21  2049 ? ?  ? ?History ? ?Chief Complaint  ?Patient presents with  ? Leg Pain  ? ? ?[name] is a 76 y.o. female. ? ?Patient presents to the emergency department with multiple complaints.  Patient reports that she has a headache and also is experiencing pain in the left leg.  She reports that she has been taking Tylenol but it does not help. ? ? ?  ? ?Home Medications ?Prior to Admission medications   ?Medication Sig Start Date End Date Taking? Authorizing Provider  ?acetaminophen (TYLENOL) 500 MG tablet Take 2 tablets (1,000 mg total) by mouth every 6 (six) hours as needed. 03/30/21   Arby Barrette, MD  ?albuterol (VENTOLIN HFA) 108 (90 Base) MCG/ACT inhaler Inhale 1-2 puffs into the lungs every 6 (six) hours as needed for wheezing or shortness of breath. 04/22/20   Barbette Merino, NP  ?aspirin EC 81 MG tablet Take 81 mg by mouth daily. Swallow whole.    [provider]  ?cephALEXin (KEFLEX) 500 MG capsule Take 1 capsule (500 mg total) by mouth 4 (four) times daily. 03/28/21   Ernie Avena, MD  ?chlorthalidone (HYGROTON) 25 MG tablet Take 1 tablet (25 mg total) by mouth daily. 10/05/20 10/05/21  Barbette Merino, NP  ?clopidogrel (PLAVIX) 75 MG tablet Take 1 tablet (75 mg total) by mouth daily. 04/22/20   Barbette Merino, NP  ?diclofenac Sodium (VOLTAREN) 1 % GEL Apply 2 g topically 4 (four) times daily as needed. 03/23/21   [name], MD  ?diphenhydramine-acetaminophen (TYLENOL PM) 25-500 MG TABS tablet Take 0.5 tablets by mouth at bedtime as needed. 1/2 tablet    [provider]  ?gabapentin (NEURONTIN) 300 MG capsule Take 1 capsule (300 mg total) by mouth 3 (three) times daily. ?Patient taking differently: Take 300 mg by mouth daily. 10/05/20   Barbette Merino, NP  ?gatifloxacin (ZYMAXID) 0.5 % SOLN Place 1 drop into the right eye 4 (four) times daily. 06/28/20   [provider]  ?glipiZIDE (GLUCOTROL) 5 MG tablet Take 1 tablet (5 mg total) by mouth 2 (two) times daily before a meal. 04/22/20 04/22/21  Barbette Merino, NP  ?hydrALAZINE (APRESOLINE) 50 MG tablet Take 1 tablet (50 mg total) by mouth 2 (two) times daily. ?Patient taking differently: Take 50 mg by mouth daily. 04/22/20   Barbette Merino, NP  ?ketorolac (ACULAR) 0.5 % ophthalmic solution Place 1 drop into the right eye 4 (four) times daily. ?Patient not taking: Reported on 02/01/2021 06/28/20   [provider]  ?lidocaine (LIDODERM) 5 % Place 1 patch onto the skin daily. Remove & Discard patch within 12 hours or as directed by MD 01/10/21   Arthor Captain, PA-C  ?losartan (COZAAR) 50 MG tablet Take 1 tablet (50 mg total) by mouth daily. 10/05/20 10/05/21  Barbette Merino, NP  ?metFORMIN (GLUCOPHAGE) 500 MG tablet Take 2 tablets (1,000 mg total) by mouth 2 (two) times daily with a meal. ?Patient taking differently: Take 1,000 mg by mouth daily with breakfast. 10/05/20 10/05/21  Barbette Merino, NP  ?metoCLOPramide (REGLAN) 10 MG tablet Take 1 tablet (10 mg total) by mouth every 6 (six) hours as needed for nausea (nausea/headache). 03/23/21   [name], MD  ?mirabegron ER (MYRBETRIQ) 25 MG TB24 tablet Take 25 mg by mouth daily.    [provider]  ?naproxen (NAPROSYN) 250  MG tablet Take 1 tablet (250 mg total) by mouth 2 (two) times daily with a meal. 02/05/21   Linwood Dibbles, MD  ?[facility] VERIO test strip Use 1-4 times daily as directed/needed   DX E11.9 ?Patient not taking: No sig reported 04/25/20   Barbette Merino, NP  ?potassium chloride (KLOR-CON M) 20 MEQ tablet Take 0.5 tablets (10 mEq total) by mouth daily. 02/25/21   Dione Booze, MD  ?prednisoLONE acetate (PRED FORTE) 1 % ophthalmic suspension Place 1 drop into the right eye 4 (four) times daily. ?Patient not taking: Reported on 02/01/2021 06/28/20   [provider]  ?predniSONE (DELTASONE) 50 MG tablet Take 1 tablet (50 mg total) by mouth  daily. 03/10/21   Dione Booze, MD  ?rosuvastatin (CRESTOR) 10 MG tablet Take 1 tablet (10 mg total) by mouth at bedtime. 10/05/20 10/05/21  Barbette Merino, NP  ?traMADol (ULTRAM) 50 MG tablet Take 1 tablet (50 mg total) by mouth every 6 (six) hours as needed. 1-2 tablets every 6 hours as needed for pain 03/30/21   Arby Barrette, MD  ?   ? ?Allergies    ?Codeine, Ibuprofen, and Imdur [isosorbide nitrate]   ? ?Review of Systems   ?Review of Systems  ?Musculoskeletal:  Positive for arthralgias.  ?Neurological:  Positive for headaches.  ? ?Physical Exam ?Updated Vital Signs ?BP (!) 169/58 (BP Location: Left Arm)   Pulse 70   Temp 97.6 ?F (36.4 ?C) (Oral)   Resp 18   SpO2 100%  ?Physical Exam ?Vitals and nursing note reviewed.  ?Constitutional:   ?   General: She is not in acute distress. ?   Appearance: She is well-developed.  ?HENT:  ?   Head: Normocephalic and atraumatic.  ?   Mouth/Throat:  ?   Mouth: Mucous membranes are moist.  ?Eyes:  ?   General: Vision grossly intact. Gaze aligned appropriately.  ?   Extraocular Movements: Extraocular movements intact.  ?   Conjunctiva/sclera: Conjunctivae normal.  ?Cardiovascular:  ?   Rate and Rhythm: Normal rate and regular rhythm.  ?   Pulses: Normal pulses.  ?   Heart sounds: Normal heart sounds, S1 normal and S2 normal. No murmur heard. ?  No friction rub. No gallop.  ?Pulmonary:  ?   Effort: Pulmonary effort is normal. No respiratory distress.  ?   Breath sounds: Normal breath sounds.  ?Abdominal:  ?   General: Bowel sounds are normal.  ?   Palpations: Abdomen is soft.  ?   Tenderness: There is no abdominal tenderness. There is no guarding or rebound.  ?   Hernia: No hernia is present.  ?Musculoskeletal:     ?   General: No swelling.  ?   Cervical back: Full passive range of motion without pain, normal range of motion and neck supple. No spinous process tenderness or muscular tenderness. Normal range of motion.  ?   Right lower leg: Edema present.  ?   Left lower leg:  Edema present.  ?Skin: ?   General: Skin is warm and dry.  ?   Capillary Refill: Capillary refill takes less than 2 seconds.  ?   Findings: No ecchymosis, erythema, rash or wound.  ?Neurological:  ?   General: No focal deficit present.  ?   Mental Status: She is alert and oriented to person, place, and time.  ?   GCS: GCS eye subscore is 4. GCS verbal subscore is 5. GCS motor subscore is 6.  ?  Cranial Nerves: Cranial nerves 2-12 are intact.  ?   Sensory: Sensation is intact.  ?   Motor: Motor function is intact.  ?   Coordination: Coordination is intact.  ?Psychiatric:     ?   Attention and Perception: Attention normal.     ?   Mood and Affect: Mood normal.     ?   Speech: Speech normal.     ?   Behavior: Behavior normal.  ? ? ?ED Results / Procedures / Treatments   ?Labs ?(all labs ordered are listed, but only abnormal results are displayed) ?Labs Reviewed - No data to display ? ?EKG ?None ? ?Radiology ?DG Pelvis 1-2 Views ? ?Result Date: 04/25/2021 ?CLINICAL DATA:  Fall, left hip pain EXAM: PELVIS - 1-2 VIEW COMPARISON:  None. FINDINGS: Study is limited due to overlying keys and other external structures over the left hip, the area of concern. No visible fracture. No subluxation or dislocation. Mild degenerative changes in the hips bilaterally. IMPRESSION: Limited study due to external foreign bodies overlying the left hip. No visible acute bony abnormality. Electronically Signed   By: Charlett NoseKevin  Dover M.D.   On: 04/25/2021 02:30  ? ?DG Knee Complete 4 Views Left ? ?Result Date: 04/25/2021 ?CLINICAL DATA:  Fall, pain EXAM: LEFT KNEE - COMPLETE 4+ VIEW COMPARISON:  None. FINDINGS: No acute bony abnormality. Specifically, no fracture, subluxation, or dislocation. No joint effusion. Soft tissues are intact. IMPRESSION: No acute bony abnormality. Electronically Signed   By: Charlett NoseKevin  Dover M.D.   On: 04/25/2021 02:30   ? ?Procedures ?Procedures  ? ? ?Medications Ordered in ED ?Medications  ?hydrALAZINE (APRESOLINE) tablet 50  mg (50 mg Oral Given 04/25/21 0207)  ?oxyCODONE-acetaminophen (PERCOCET/ROXICET) 5-325 MG per tablet 1 tablet (1 tablet Oral Given 04/25/21 0208)  ? ? ?ED Course/ Medical Decision Making/ A&P ?  ?

## 2021-05-02 ENCOUNTER — Other Ambulatory Visit: Payer: Self-pay

## 2021-05-02 ENCOUNTER — Emergency Department (HOSPITAL_COMMUNITY)
Admission: EM | Admit: 2021-05-02 | Discharge: 2021-05-02 | Disposition: A | Discharge status: Home / Self Care | Payer: Medicare Other | Attending: Emergency Medicine | Admitting: Emergency Medicine

## 2021-05-02 ENCOUNTER — Encounter (HOSPITAL_COMMUNITY): Payer: Self-pay

## 2021-05-02 DIAGNOSIS — M791 Myalgia, unspecified site: Secondary | ICD-10-CM | POA: Insufficient documentation

## 2021-05-02 DIAGNOSIS — Z79899 Other long term (current) drug therapy: Secondary | ICD-10-CM | POA: Diagnosis not present

## 2021-05-02 DIAGNOSIS — I10 Essential (primary) hypertension: Secondary | ICD-10-CM | POA: Insufficient documentation

## 2021-05-02 DIAGNOSIS — Z7982 Long term (current) use of aspirin: Secondary | ICD-10-CM | POA: Diagnosis not present

## 2021-05-02 DIAGNOSIS — E119 Type 2 diabetes mellitus without complications: Secondary | ICD-10-CM | POA: Diagnosis not present

## 2021-05-02 DIAGNOSIS — Y92410 Unspecified street and highway as the place of occurrence of the external cause: Secondary | ICD-10-CM | POA: Diagnosis not present

## 2021-05-02 DIAGNOSIS — Z7984 Long term (current) use of oral hypoglycemic drugs: Secondary | ICD-10-CM | POA: Diagnosis not present

## 2021-05-02 DIAGNOSIS — W19XXXA Unspecified fall, initial encounter: Secondary | ICD-10-CM

## 2021-05-02 MED ORDER — GABAPENTIN 100 MG PO CAPS: 100.0000 mg | ORAL_CAPSULE | Freq: Three times a day (TID) | ORAL | 0 refills | Status: DC | PRN

## 2021-05-02 MED ORDER — GABAPENTIN 300 MG PO CAPS
300.0000 mg | ORAL_CAPSULE | Freq: Once | ORAL | Status: AC
  Administered 2021-05-02: 300 mg via ORAL
  Filled 2021-05-02: qty 1
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
## 2021-05-02 NOTE — ED Notes (Signed)
Called [employer] to take patient to home address on file.  ?

## 2021-05-03 ENCOUNTER — Emergency Department (HOSPITAL_COMMUNITY)
Admission: EM | Admit: 2021-05-03 | Discharge: 2021-05-03 | Disposition: A | Discharge status: Home / Self Care | Payer: Medicare Other | Attending: Emergency Medicine | Admitting: Emergency Medicine

## 2021-05-03 ENCOUNTER — Emergency Department (HOSPITAL_COMMUNITY): Payer: Medicare Other

## 2021-05-03 ENCOUNTER — Other Ambulatory Visit: Payer: Self-pay

## 2021-05-03 ENCOUNTER — Encounter (HOSPITAL_COMMUNITY): Payer: Self-pay

## 2021-05-03 DIAGNOSIS — I10 Essential (primary) hypertension: Secondary | ICD-10-CM | POA: Diagnosis not present

## 2021-05-03 DIAGNOSIS — Z7982 Long term (current) use of aspirin: Secondary | ICD-10-CM | POA: Diagnosis not present

## 2021-05-03 DIAGNOSIS — W01198A Fall on same level from slipping, tripping and stumbling with subsequent striking against other object, initial encounter: Secondary | ICD-10-CM | POA: Diagnosis not present

## 2021-05-03 DIAGNOSIS — M25512 Pain in left shoulder: Secondary | ICD-10-CM | POA: Insufficient documentation

## 2021-05-03 DIAGNOSIS — M791 Myalgia, unspecified site: Secondary | ICD-10-CM | POA: Insufficient documentation

## 2021-05-03 DIAGNOSIS — R519 Headache, unspecified: Secondary | ICD-10-CM | POA: Insufficient documentation

## 2021-05-03 DIAGNOSIS — Z79899 Other long term (current) drug therapy: Secondary | ICD-10-CM | POA: Insufficient documentation

## 2021-05-03 DIAGNOSIS — W19XXXA Unspecified fall, initial encounter: Secondary | ICD-10-CM

## 2021-05-03 MED ORDER — ACETAMINOPHEN 325 MG PO TABS
650.0000 mg | ORAL_TABLET | Freq: Once | ORAL | Status: AC
  Administered 2021-05-03: 650 mg via ORAL
  Filled 2021-05-03: qty 2

## 2021-05-03 MED ORDER — HYDRALAZINE HCL 25 MG PO TABS
50.0000 mg | ORAL_TABLET | Freq: Once | ORAL | Status: AC
  Administered 2021-05-03: 50 mg via ORAL
  Filled 2021-05-03: qty 2
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
## 2021-05-03 NOTE — Discharge Instructions (Addendum)
Take your medications as directed and make sure to follow-up with your primary care doctor.  Return to the ER for any new or worsening symptoms. ?

## 2021-05-03 NOTE — Care Management (Signed)
Patient has had 37 ED visit in the past 6 month none which resulted in a hospitalization ED RNCM  met with patient at bedside, patient is requesting cab voucher to get home.  Discussed that budget constraints at [facility] we are limiting the amount taxi vouchers we are able to provide.  Asked patient can she be able request a cab to transport her home. Patient states, she did not have the funds , asked if patient was ok with getting home by cab patient was agreeable. An anonymous donor provided cab payment.  Cab called and patient placed safely into cab.  ?

## 2021-05-03 NOTE — ED Notes (Signed)
Patient able to ambulate to hall bed with slow but steady gait.  ?
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
## 2021-05-03 NOTE — ED Notes (Signed)
Patient given orange juice per request  
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
## 2021-05-05 ENCOUNTER — Encounter (HOSPITAL_COMMUNITY): Payer: Self-pay

## 2021-05-05 ENCOUNTER — Emergency Department (HOSPITAL_COMMUNITY)
Admission: EM | Admit: 2021-05-05 | Discharge: 2021-05-05 | Disposition: A | Discharge status: Home / Self Care | Payer: Medicare Other | Attending: Emergency Medicine | Admitting: Emergency Medicine

## 2021-05-05 ENCOUNTER — Emergency Department (HOSPITAL_COMMUNITY)
Admission: EM | Admit: 2021-05-05 | Discharge: 2021-05-06 | Disposition: A | Discharge status: Home / Self Care | Payer: Medicare Other | Source: Home / Self Care | Attending: Emergency Medicine | Admitting: Emergency Medicine

## 2021-05-05 ENCOUNTER — Other Ambulatory Visit: Payer: Self-pay

## 2021-05-05 DIAGNOSIS — R030 Elevated blood-pressure reading, without diagnosis of hypertension: Secondary | ICD-10-CM

## 2021-05-05 DIAGNOSIS — I129 Hypertensive chronic kidney disease with stage 1 through stage 4 chronic kidney disease, or unspecified chronic kidney disease: Secondary | ICD-10-CM | POA: Insufficient documentation

## 2021-05-05 DIAGNOSIS — R519 Headache, unspecified: Secondary | ICD-10-CM

## 2021-05-05 DIAGNOSIS — Z7982 Long term (current) use of aspirin: Secondary | ICD-10-CM | POA: Insufficient documentation

## 2021-05-05 DIAGNOSIS — Z79899 Other long term (current) drug therapy: Secondary | ICD-10-CM | POA: Insufficient documentation

## 2021-05-05 DIAGNOSIS — N189 Chronic kidney disease, unspecified: Secondary | ICD-10-CM | POA: Insufficient documentation

## 2021-05-05 DIAGNOSIS — I10 Essential (primary) hypertension: Secondary | ICD-10-CM

## 2021-05-05 MED ORDER — ACETAMINOPHEN 500 MG PO TABS
1000.0000 mg | ORAL_TABLET | Freq: Once | ORAL | Status: AC
  Administered 2021-05-05: 1000 mg via ORAL
  Filled 2021-05-05: qty 2

## 2021-05-05 NOTE — ED Provider Triage Note (Signed)
Emergency Medicine Provider Triage Evaluation Note ? ?[name] , a 76 y.o. female  was evaluated in triage.  Pt complains of headache and bilateral knee pain.  She is well known to the ED for similar complaints.  Has had 6 CT's so far thus year for same.   ? ?Review of Systems  ?Positive: headache ?Negative: fever ? ?Physical Exam  ?BP (!) 179/79 (BP Location: Right Arm)   Pulse 84   Temp 98.5 ?F (36.9 ?C) (Oral)   Resp 18   SpO2 94%  ? ?Gen:   Awake, no distress   ?Resp:  Normal effort  ?MSK:   Moves extremities without difficulty  ?Other:   ? ?Medical Decision Making  ?Medically screening exam initiated at 12:29 AM.  Appropriate orders placed.  [name] was informed that the remainder of the evaluation will be completed by another provider, this initial triage assessment does not replace that evaluation, and the importance of remaining in the ED until their evaluation is complete. ? ?Headache, bilateral knee pain.  Recurrent hx of same.  VSS.   ?  ?Garlon Hatchet, PA-C ?05/05/21 0041 ? ?

## 2021-05-05 NOTE — ED Notes (Signed)
Pt called for vs, no response. ?

## 2021-05-05 NOTE — ED Triage Notes (Signed)
PER EMS: pt reports headache but is not sure when it started. She also reports b/l knee pain. ?BP- 202/88, HR- 85, 97% RA, CBG-124 ?

## 2021-05-05 NOTE — Discharge Instructions (Signed)

## 2021-05-05 NOTE — ED Provider Notes (Signed)
?[facility] EMERGENCY DEPARTMENT ?Provider Note ? ? ?CSN: [id number] ?Arrival date & time: 05/05/21  0033 ? ?  ? ?History ? ?Chief Complaint  ?Patient presents with  ? Headache  ? ? ?[name] is a 76 y.o. female. Who is well known to this ed. This is her 37th visit in 6 mos. She was seen 2 days ago for headache after fall and had a negative head CT. She has not taken anything for her headache and complains of pain all over her entire body.  She is ambulatory.  Patient also is noted to have elevated blood pressure.  She complains of high blood pressure.  She does not see a primary care physician and has not seen a primary care physician with multiple referrals to follow-up.  She has no changes to her headache today, and has no other complaints at this time ? ? ?Headache ? ?  ? ?Home Medications ?Prior to Admission medications   ?Medication Sig Start Date End Date Taking? Authorizing Provider  ?acetaminophen (TYLENOL) 500 MG tablet Take 2 tablets (1,000 mg total) by mouth every 6 (six) hours as needed. 03/30/21   Arby Barrette, MD  ?albuterol (VENTOLIN HFA) 108 (90 Base) MCG/ACT inhaler Inhale 1-2 puffs into the lungs every 6 (six) hours as needed for wheezing or shortness of breath. 04/22/20   Barbette Merino, NP  ?aspirin EC 81 MG tablet Take 81 mg by mouth daily. Swallow whole.    [provider]  ?cephALEXin (KEFLEX) 500 MG capsule Take 1 capsule (500 mg total) by mouth 4 (four) times daily. 03/28/21   Ernie Avena, MD  ?chlorthalidone (HYGROTON) 25 MG tablet Take 1 tablet (25 mg total) by mouth daily. 10/05/20 10/05/21  Barbette Merino, NP  ?clopidogrel (PLAVIX) 75 MG tablet Take 1 tablet (75 mg total) by mouth daily. 04/22/20   Barbette Merino, NP  ?diclofenac Sodium (VOLTAREN) 1 % GEL Apply 2 g topically 4 (four) times daily as needed. 03/23/21   [name], MD  ?diphenhydramine-acetaminophen (TYLENOL PM) 25-500 MG TABS tablet Take 0.5 tablets by mouth at bedtime as needed. 1/2  tablet    [provider]  ?gabapentin (NEURONTIN) 100 MG capsule Take 1 capsule (100 mg total) by mouth 3 (three) times daily as needed for up to 30 doses. 05/02/21   Terald Sleeper, MD  ?gabapentin (NEURONTIN) 300 MG capsule Take 1 capsule (300 mg total) by mouth 3 (three) times daily. ?Patient taking differently: Take 300 mg by mouth daily. 10/05/20   Barbette Merino, NP  ?gatifloxacin (ZYMAXID) 0.5 % SOLN Place 1 drop into the right eye 4 (four) times daily. 06/28/20   [provider]  ?glipiZIDE (GLUCOTROL) 5 MG tablet Take 1 tablet (5 mg total) by mouth 2 (two) times daily before a meal. 04/22/20 04/22/21  Barbette Merino, NP  ?hydrALAZINE (APRESOLINE) 50 MG tablet Take 1 tablet (50 mg total) by mouth 2 (two) times daily. ?Patient taking differently: Take 50 mg by mouth daily. 04/22/20   Barbette Merino, NP  ?ketorolac (ACULAR) 0.5 % ophthalmic solution Place 1 drop into the right eye 4 (four) times daily. ?Patient not taking: Reported on 02/01/2021 06/28/20   [provider]  ?lidocaine (LIDODERM) 5 % Place 1 patch onto the skin daily. Remove & Discard patch within 12 hours or as directed by MD 01/10/21   Arthor Captain, PA-C  ?losartan (COZAAR) 50 MG tablet Take 1 tablet (50 mg total) by mouth daily.  10/05/20 10/05/21  Barbette Merino, NP  ?metFORMIN (GLUCOPHAGE) 500 MG tablet Take 2 tablets (1,000 mg total) by mouth 2 (two) times daily with a meal. ?Patient taking differently: Take 1,000 mg by mouth daily with breakfast. 10/05/20 10/05/21  Barbette Merino, NP  ?metoCLOPramide (REGLAN) 10 MG tablet Take 1 tablet (10 mg total) by mouth every 6 (six) hours as needed for nausea (nausea/headache). 03/23/21   [name], MD  ?mirabegron ER (MYRBETRIQ) 25 MG TB24 tablet Take 25 mg by mouth daily.    [provider]  ?naproxen (NAPROSYN) 250 MG tablet Take 1 tablet (250 mg total) by mouth 2 (two) times daily with a meal. 02/05/21   Linwood Dibbles, MD  ?[facility] VERIO test strip Use 1-4 times  daily as directed/needed   DX E11.9 ?Patient not taking: No sig reported 04/25/20   Barbette Merino, NP  ?potassium chloride (KLOR-CON M) 20 MEQ tablet Take 0.5 tablets (10 mEq total) by mouth daily. 02/25/21   Dione Booze, MD  ?prednisoLONE acetate (PRED FORTE) 1 % ophthalmic suspension Place 1 drop into the right eye 4 (four) times daily. ?Patient not taking: Reported on 02/01/2021 06/28/20   [provider]  ?predniSONE (DELTASONE) 50 MG tablet Take 1 tablet (50 mg total) by mouth daily. 03/10/21   Dione Booze, MD  ?rosuvastatin (CRESTOR) 10 MG tablet Take 1 tablet (10 mg total) by mouth at bedtime. 10/05/20 10/05/21  Barbette Merino, NP  ?traMADol (ULTRAM) 50 MG tablet Take 1 tablet (50 mg total) by mouth every 6 (six) hours as needed. 1-2 tablets every 6 hours as needed for pain 03/30/21   Arby Barrette, MD  ?   ? ?Allergies    ?Codeine, Ibuprofen, and Imdur [isosorbide nitrate]   ? ?Review of Systems   ?Review of Systems  ?Neurological:  Positive for headaches.  ? ?Physical Exam ?Updated Vital Signs ?BP (!) 177/81 (BP Location: Right Arm)   Pulse 85   Temp 98.4 ?F (36.9 ?C) (Oral)   Resp 18   SpO2 97%  ?Physical Exam ?Physical Exam  ?Constitutional: Pt is oriented to person, place, and time. Pt appears well-developed and well-nourished. No distress.  ?HENT:  ?Head: Normocephalic and atraumatic.  ?Mouth/Throat: Oropharynx is clear and moist.  ?Eyes: Conjunctivae and EOM are normal. Pupils are equal, round, and reactive to light. No scleral icterus.  ?No horizontal, vertical or rotational nystagmus  ?Neck: Normal range of motion. Neck supple.  ?Full active and passive ROM without pain ?No midline or paraspinal tenderness ?No nuchal rigidity or meningeal signs  ?Cardiovascular: Normal rate, regular rhythm and intact distal pulses.   ?Pulmonary/Chest: Effort normal and breath sounds normal. No respiratory distress. Pt has no wheezes. No rales.  ?Abdominal: Soft. Bowel sounds are normal. There is no  tenderness. There is no rebound and no guarding.  ?Musculoskeletal: Normal range of motion.  ?Lymphadenopathy:  ?  No cervical adenopathy.  ?Neurological: Pt. is alert and oriented to person, place, and time. He has normal reflexes. No cranial nerve deficit.  Exhibits normal muscle tone. Coordination normal.  ?Mental Status:  ?Alert, oriented, thought content appropriate. Speech fluent without evidence of aphasia. Able to follow 2 step commands without difficulty.  ?Cranial Nerves:  ?II:  Peripheral visual fields grossly normal, pupils equal, round, reactive to light ?III,IV, VI: ptosis not present, extra-ocular motions intact bilaterally  ?V,VII: smile symmetric, facial light touch sensation equal ?VIII: hearing grossly normal bilaterally  ?IX,X: midline uvula rise  ?XI: bilateral shoulder  shrug equal and strong ?XII: midline tongue extension  ?Motor:  ?5/5 in upper and lower extremities bilaterally including strong and equal grip strength and dorsiflexion/plantar flexion ?Sensory: Pinprick and light touch normal in all extremities.  ?Deep Tendon Reflexes: 2+ and symmetric  ?Cerebellar: normal finger-to-nose with bilateral upper extremities ?Gait: normal gait and balance ?CV: distal pulses palpable throughout   ?Skin: Skin is warm and dry. No rash noted. Pt is not diaphoretic.  ?Psychiatric: Pt has a normal mood and affect. Behavior is normal. Judgment and thought content normal.  ?Nursing note and vitals reviewed. ? ?ED Results / Procedures / Treatments   ?Labs ?(all labs ordered are listed, but only abnormal results are displayed) ?Labs Reviewed - No data to display ? ?EKG ?None ? ?Radiology ?CT Head Wo Contrast ? ?Result Date: 05/03/2021 ?CLINICAL DATA:  Status post fall. EXAM: CT HEAD WITHOUT CONTRAST TECHNIQUE: Contiguous axial images were obtained from the base of the skull through the vertex without intravenous contrast. RADIATION DOSE REDUCTION: This exam was performed according to the departmental  dose-optimization program which includes automated exposure control, adjustment of the mA and/or kV according to patient size and/or use of iterative reconstruction technique. COMPARISON:  April 11, 2021 FINDINGS: Brain: Ther

## 2021-05-05 NOTE — ED Provider Triage Note (Signed)
Emergency Medicine Provider Triage Evaluation Note ? ?Gardiner Sleeper , a 76 y.o. female  was evaluated in triage.  Pt complains of HA and bilateral knee pain. Hx of same. She has had six CTs this year and was seen earlier today for the same. ? ?Physical Exam  ?BP (!) 175/71 (BP Location: Left Arm)   Pulse 85   Temp 98 ?F (36.7 ?C) (Oral)   Resp 16   SpO2 99%  ?Gen:   Awake, no distress   ?Resp:  Normal effort  ?MSK:   Moves extremities without difficulty  ?Other:  No palpable tenderness in the knees. No overlying skin changes or increased warmth.  ? ?Medical Decision Making  ?Medically screening exam initiated at 6:11 PM.  Appropriate orders placed.  Rether Isaiah was informed that the remainder of the evaluation will be completed by another provider, this initial triage assessment does not replace that evaluation, and the importance of remaining in the ED until their evaluation is complete. ?  ?Rayna Sexton, PA-C ?05/05/21 1812 ? ?

## 2021-05-05 NOTE — ED Notes (Signed)
Called for vs, no response. ?

## 2021-05-05 NOTE — ED Triage Notes (Signed)
Pt reports headache and left leg pain x1 week. Pt was seen at [facility] prior to arrival.  ?

## 2021-05-06 DIAGNOSIS — R519 Headache, unspecified: Secondary | ICD-10-CM | POA: Diagnosis not present

## 2021-05-06 MED ORDER — LIDOCAINE 5 % EX PTCH: 1.0000 | MEDICATED_PATCH | CUTANEOUS | 0 refills | Status: DC

## 2021-05-06 MED ORDER — ACETAMINOPHEN 325 MG PO TABS
650.0000 mg | ORAL_TABLET | Freq: Once | ORAL | Status: AC
  Administered 2021-05-06: 650 mg via ORAL
  Filled 2021-05-06: qty 2

## 2021-05-06 MED ORDER — HYDRALAZINE HCL 25 MG PO TABS
50.0000 mg | ORAL_TABLET | Freq: Once | ORAL | Status: AC
  Administered 2021-05-06: 50 mg via ORAL
  Filled 2021-05-06: qty 2

## 2021-05-06 NOTE — ED Notes (Signed)
Patient observed resting quietly with eyes closed on bed in hallway.  Respirations even and unlabored ?

## 2021-05-06 NOTE — ED Notes (Signed)
Patient provided with graham crackers and orange juice per request ?

## 2021-05-06 NOTE — ED Provider Notes (Signed)
?[facility]-EMERGENCY DEPT ?Provider Note ? ? ?CSN: [id number] ?Arrival date & time: 05/05/21  1748 ? ?  ? ?History ? ?Chief Complaint  ?Patient presents with  ? Headache  ? Leg Pain  ? ? ?[name] is a 76 y.o. female. ? ?The history is provided by the patient and medical records.  ?Headache ?Leg Pain ?[name] is a 76 y.o. female who presents to the Emergency Department complaining of headache.  She presents the ED complaining of headache on the top of her head that has been present constantly for at least a week but she cannot verbalize how much more than a week it was present.  She thinks it is because her blood pressure is high.  She states that she takes her home medications but is unable to state what they are.  She also states that she has a twice daily medication but is not sure what it is and she only takes it on the weekends when her granddaughter is present.  She denies any chest pain, difficulty breathing, nausea, vomiting.  She does complain of pain all over her arms and legs, which has been present since she fell over a month ago.  This is unchanged from her baseline pain.  She was evaluated in the [facility] emergency department prior to being evaluated here.  She states he took the bus here. ?  ? ?Home Medications ?Prior to Admission medications   ?Medication Sig Start Date End Date Taking? Authorizing Provider  ?acetaminophen (TYLENOL) 500 MG tablet Take 2 tablets (1,000 mg total) by mouth every 6 (six) hours as needed. 03/30/21   Arby Barrette, MD  ?albuterol (VENTOLIN HFA) 108 (90 Base) MCG/ACT inhaler Inhale 1-2 puffs into the lungs every 6 (six) hours as needed for wheezing or shortness of breath. 04/22/20   Barbette Merino, NP  ?aspirin EC 81 MG tablet Take 81 mg by mouth daily. Swallow whole.    [provider]  ?cephALEXin (KEFLEX) 500 MG capsule Take 1 capsule (500 mg total) by mouth 4 (four) times daily. 03/28/21   Ernie Avena, MD  ?chlorthalidone  (HYGROTON) 25 MG tablet Take 1 tablet (25 mg total) by mouth daily. 10/05/20 10/05/21  Barbette Merino, NP  ?clopidogrel (PLAVIX) 75 MG tablet Take 1 tablet (75 mg total) by mouth daily. 04/22/20   Barbette Merino, NP  ?diclofenac Sodium (VOLTAREN) 1 % GEL Apply 2 g topically 4 (four) times daily as needed. 03/23/21   [name], MD  ?diphenhydramine-acetaminophen (TYLENOL PM) 25-500 MG TABS tablet Take 0.5 tablets by mouth at bedtime as needed. 1/2 tablet    [provider]  ?gabapentin (NEURONTIN) 100 MG capsule Take 1 capsule (100 mg total) by mouth 3 (three) times daily as needed for up to 30 doses. 05/02/21   Terald Sleeper, MD  ?gabapentin (NEURONTIN) 300 MG capsule Take 1 capsule (300 mg total) by mouth 3 (three) times daily. ?Patient taking differently: Take 300 mg by mouth daily. 10/05/20   Barbette Merino, NP  ?gatifloxacin (ZYMAXID) 0.5 % SOLN Place 1 drop into the right eye 4 (four) times daily. 06/28/20   [provider]  ?glipiZIDE (GLUCOTROL) 5 MG tablet Take 1 tablet (5 mg total) by mouth 2 (two) times daily before a meal. 04/22/20 04/22/21  Barbette Merino, NP  ?hydrALAZINE (APRESOLINE) 50 MG tablet Take 1 tablet (50 mg total) by mouth 2 (two) times daily. ?Patient taking differently: Take 50 mg by mouth daily.  04/22/20   Barbette MerinoKing, Crystal M, NP  ?ketorolac (ACULAR) 0.5 % ophthalmic solution Place 1 drop into the right eye 4 (four) times daily. ?Patient not taking: Reported on 02/01/2021 06/28/20   [provider]  ?lidocaine (LIDODERM) 5 % Place 1 patch onto the skin daily. Remove & Discard patch within 12 hours or as directed by MD 01/10/21   Arthor CaptainHarris, Abigail, PA-C  ?losartan (COZAAR) 50 MG tablet Take 1 tablet (50 mg total) by mouth daily. 10/05/20 10/05/21  Barbette MerinoKing, Crystal M, NP  ?metFORMIN (GLUCOPHAGE) 500 MG tablet Take 2 tablets (1,000 mg total) by mouth 2 (two) times daily with a meal. ?Patient taking differently: Take 1,000 mg by mouth daily with breakfast. 10/05/20 10/05/21  Barbette MerinoKing,  Crystal M, NP  ?metoCLOPramide (REGLAN) 10 MG tablet Take 1 tablet (10 mg total) by mouth every 6 (six) hours as needed for nausea (nausea/headache). 03/23/21   Charlynne PanderYao, David Hsienta, MD  ?mirabegron ER (MYRBETRIQ) 25 MG TB24 tablet Take 25 mg by mouth daily.    [provider]  ?naproxen (NAPROSYN) 250 MG tablet Take 1 tablet (250 mg total) by mouth 2 (two) times daily with a meal. 02/05/21   Linwood DibblesKnapp, Jon, MD  ?[facility] test [facility] Use 1-4 times daily as directed/needed   DX E11.9 ?Patient not taking: No sig reported 04/25/20   Barbette MerinoKing, Crystal M, NP  ?potassium chloride (KLOR-CON M) 20 MEQ tablet Take 0.5 tablets (10 mEq total) by mouth daily. 02/25/21   Dione BoozeGlick, David, MD  ?prednisoLONE acetate (PRED FORTE) 1 % ophthalmic suspension Place 1 drop into the right eye 4 (four) times daily. ?Patient not taking: Reported on 02/01/2021 06/28/20   [provider]  ?predniSONE (DELTASONE) 50 MG tablet Take 1 tablet (50 mg total) by mouth daily. 03/10/21   Dione BoozeGlick, David, MD  ?rosuvastatin (CRESTOR) 10 MG tablet Take 1 tablet (10 mg total) by mouth at bedtime. 10/05/20 10/05/21  Barbette MerinoKing, Crystal M, NP  ?traMADol (ULTRAM) 50 MG tablet Take 1 tablet (50 mg total) by mouth every 6 (six) hours as needed. 1-2 tablets every 6 hours as needed for pain 03/30/21   Arby BarrettePfeiffer, Marcy, MD  ?   ? ?Allergies    ?Codeine, Ibuprofen, and Imdur [isosorbide nitrate]   ? ?Review of Systems   ?Review of Systems  ?Neurological:  Positive for headaches.  ?All other systems reviewed and are negative. ? ?Physical Exam ?Updated Vital Signs ?BP (!) 191/84   Pulse 75   Temp 98 ?F (36.7 ?C) (Oral)   Resp 16   SpO2 100%  ?Physical Exam ?Vitals and nursing note reviewed.  ?Constitutional:   ?   Appearance: She is well-developed.  ?HENT:  ?   Head: Normocephalic and atraumatic.  ?Eyes:  ?   Extraocular Movements: Extraocular movements intact.  ?   Pupils: Pupils are equal, round, and reactive to light.  ?Cardiovascular:  ?   Rate and Rhythm: Normal rate and  regular rhythm.  ?   Heart sounds: No murmur heard. ?Pulmonary:  ?   Effort: Pulmonary effort is normal. No respiratory distress.  ?   Breath sounds: Normal breath sounds.  ?Abdominal:  ?   Palpations: Abdomen is soft.  ?   Tenderness: There is no abdominal tenderness. There is no guarding or rebound.  ?Musculoskeletal:     ?   General: No tenderness.  ?Skin: ?   General: Skin is warm and dry.  ?Neurological:  ?   Mental Status: She is alert and oriented to person,  place, and time.  ?   Comments: No asymmetry of facial movements.  5 out of 5 strength in all 4 extremities with sensation to light touch intact in all 4 extremities.  No pronator drift.  Visual fields grossly intact.  ?Psychiatric:     ?   Behavior: Behavior normal.  ? ? ?ED Results / Procedures / Treatments   ?Labs ?(all labs ordered are listed, but only abnormal results are displayed) ?Labs Reviewed - No data to display ? ?EKG ?None ? ?Radiology ?No results found. ? ?Procedures ?Procedures  ? ? ?Medications Ordered in ED ?Medications  ?hydrALAZINE (APRESOLINE) tablet 50 mg (50 mg Oral Given 05/06/21 0618)  ?acetaminophen (TYLENOL) tablet 650 mg (650 mg Oral Given 05/06/21 0618)  ? ? ?ED Course/ Medical Decision Making/ A&P ?  ?                        ?Medical Decision Making ?Risk ?OTC drugs. ?Prescription drug management. ? ? ?Patient with history of poorly controlled chronic hypertension, CKD here for evaluation of chronic headache.  Her blood pressures are elevated in the emergency department.  On record review she usually has significant elevations in her blood pressures.  She has had 6 CT scans of the head since January of this year.  This is her fifth ED visit in April.  She is hypertensive on her assessment in the department.  She is nontoxic-appearing and well perfused.  She has no focal neurologic deficits at this time.  Current clinical picture is not consistent with hypertensive urgency, press, CVA, subarachnoid hemorrhage. ?On reassessment  after meds patient is feeling improved.  Plan to discharge home with outpatient resources and return precautions.  Patient states that she currently does not have a PCP as her PCP went out of practice.  Case manage

## 2021-05-08 ENCOUNTER — Encounter (HOSPITAL_COMMUNITY): Payer: Self-pay

## 2021-05-08 ENCOUNTER — Emergency Department (HOSPITAL_COMMUNITY)
Admission: EM | Admit: 2021-05-08 | Discharge: 2021-05-09 | Disposition: A | Discharge status: Home / Self Care | Payer: Medicare Other | Attending: Emergency Medicine | Admitting: Emergency Medicine

## 2021-05-08 DIAGNOSIS — Z7902 Long term (current) use of antithrombotics/antiplatelets: Secondary | ICD-10-CM | POA: Diagnosis not present

## 2021-05-08 DIAGNOSIS — E119 Type 2 diabetes mellitus without complications: Secondary | ICD-10-CM | POA: Diagnosis not present

## 2021-05-08 DIAGNOSIS — Z7982 Long term (current) use of aspirin: Secondary | ICD-10-CM | POA: Insufficient documentation

## 2021-05-08 DIAGNOSIS — I10 Essential (primary) hypertension: Secondary | ICD-10-CM | POA: Diagnosis not present

## 2021-05-08 DIAGNOSIS — R519 Headache, unspecified: Secondary | ICD-10-CM | POA: Insufficient documentation

## 2021-05-08 DIAGNOSIS — Z7984 Long term (current) use of oral hypoglycemic drugs: Secondary | ICD-10-CM | POA: Diagnosis not present

## 2021-05-08 DIAGNOSIS — G8929 Other chronic pain: Secondary | ICD-10-CM

## 2021-05-08 NOTE — ED Triage Notes (Signed)
Pt states that she has a headache and foot pain.  ?

## 2021-05-08 NOTE — ED Provider Triage Note (Signed)
Emergency Medicine Provider Triage Evaluation Note ? ?[name] , a 76 y.o. female  was evaluated in triage.  Pt complains of headache. Similar to prior. No alleviating/aggravating factors. ? ?Review of Systems  ?Positive: headache ?Negative: Diplopia, dizziness, numbness ? ?Physical Exam  ?BP (!) 180/72   Pulse 82   Temp 99.4 ?F (37.4 ?C)   Resp 18   SpO2 98%  ?Gen:   Awake, no distress   ?Resp:  Normal effort  ?MSK:   Moves extremities without difficulty  ?Other:  PERRL. EOMI. Sensation & strength grossly intact x 4. Intact finger to nsoe ? ?Medical Decision Making  ?Medically screening exam initiated at 10:52 PM.  Appropriate orders placed.  Laura-Lee Villegas was informed that the remainder of the evaluation will be completed by another provider, this initial triage assessment does not replace that evaluation, and the importance of remaining in the ED until their evaluation is complete. ? ?Patient presents w/ headache.  ?Seen in the ED for same multiple times with multiple reassuring head Cts. BP similar to prior at this time.  ? ?Headache.  ?  ?Cherly Anderson, PA-C ?05/08/21 2253 ? ?

## 2021-05-09 ENCOUNTER — Encounter (HOSPITAL_COMMUNITY): Payer: Self-pay | Admitting: Student

## 2021-05-09 MED ORDER — ACETAMINOPHEN 325 MG PO TABS
650.0000 mg | ORAL_TABLET | Freq: Once | ORAL | Status: AC
  Administered 2021-05-09: 650 mg via ORAL
  Filled 2021-05-09: qty 2

## 2021-05-09 NOTE — Discharge Instructions (Addendum)
Take Tylenol per over-the-counter dosing to help with headache.  Be sure to take all your blood pressure medication as prescribed.  Follow-up with your primary care within a few days for recheck of your blood pressure as well as recheck of your symptoms.  Return to the emergency department for new or worsening symptoms or any other concerns. ?

## 2021-05-09 NOTE — ED Provider Notes (Signed)
?[facility] EMERGENCY DEPARTMENT ?Provider Note ? ? ?CSN: [id number] ?Arrival date & time: 05/08/21  2151 ? ?  ? ?History ? ?Chief Complaint  ?Patient presents with  ? Headache  ? ? ?[name] is a 76 y.o. female with a hx of prior stroke, DM, and HTN who presents to the ED with headache. Gradual onset, steady progression, no alleviating/aggravating factors. No intervention PTA. Similar to prior. Denies fever, change in vision, numbness, weakness, dizziness, or syncope.  ? ?HPI ? ?  ? ?Home Medications ?Prior to Admission medications   ?Medication Sig Start Date End Date Taking? Authorizing Provider  ?acetaminophen (TYLENOL) 500 MG tablet Take 2 tablets (1,000 mg total) by mouth every 6 (six) hours as needed. 03/30/21   Arby Barrette, MD  ?albuterol (VENTOLIN HFA) 108 (90 Base) MCG/ACT inhaler Inhale 1-2 puffs into the lungs every 6 (six) hours as needed for wheezing or shortness of breath. 04/22/20   Barbette Merino, NP  ?aspirin EC 81 MG tablet Take 81 mg by mouth daily. Swallow whole.    [provider]  ?cephALEXin (KEFLEX) 500 MG capsule Take 1 capsule (500 mg total) by mouth 4 (four) times daily. 03/28/21   Ernie Avena, MD  ?chlorthalidone (HYGROTON) 25 MG tablet Take 1 tablet (25 mg total) by mouth daily. 10/05/20 10/05/21  Barbette Merino, NP  ?clopidogrel (PLAVIX) 75 MG tablet Take 1 tablet (75 mg total) by mouth daily. 04/22/20   Barbette Merino, NP  ?diclofenac Sodium (VOLTAREN) 1 % GEL Apply 2 g topically 4 (four) times daily as needed. 03/23/21   [name], MD  ?diphenhydramine-acetaminophen (TYLENOL PM) 25-500 MG TABS tablet Take 0.5 tablets by mouth at bedtime as needed. 1/2 tablet    [provider]  ?gabapentin (NEURONTIN) 100 MG capsule Take 1 capsule (100 mg total) by mouth 3 (three) times daily as needed for up to 30 doses. 05/02/21   Terald Sleeper, MD  ?gabapentin (NEURONTIN) 300 MG capsule Take 1 capsule (300 mg total) by mouth 3 (three) times  daily. ?Patient taking differently: Take 300 mg by mouth daily. 10/05/20   Barbette Merino, NP  ?gatifloxacin (ZYMAXID) 0.5 % SOLN Place 1 drop into the right eye 4 (four) times daily. 06/28/20   [provider]  ?glipiZIDE (GLUCOTROL) 5 MG tablet Take 1 tablet (5 mg total) by mouth 2 (two) times daily before a meal. 04/22/20 04/22/21  Barbette Merino, NP  ?hydrALAZINE (APRESOLINE) 50 MG tablet Take 1 tablet (50 mg total) by mouth 2 (two) times daily. ?Patient taking differently: Take 50 mg by mouth daily. 04/22/20   Barbette Merino, NP  ?ketorolac (ACULAR) 0.5 % ophthalmic solution Place 1 drop into the right eye 4 (four) times daily. ?Patient not taking: Reported on 02/01/2021 06/28/20   [provider]  ?lidocaine (LIDODERM) 5 % Place 1 patch onto the skin daily. Remove & Discard patch within 12 hours or as directed by MD 01/10/21   Arthor Captain, PA-C  ?lidocaine (LIDODERM) 5 % Place 1 patch onto the skin daily. Remove & Discard patch within 12 hours or as directed by MD 05/06/21   Tilden Fossa, MD  ?losartan (COZAAR) 50 MG tablet Take 1 tablet (50 mg total) by mouth daily. 10/05/20 10/05/21  Barbette Merino, NP  ?metFORMIN (GLUCOPHAGE) 500 MG tablet Take 2 tablets (1,000 mg total) by mouth 2 (two) times daily with a meal. ?Patient taking differently: Take 1,000 mg by mouth daily with breakfast.  10/05/20 10/05/21  Barbette MerinoKing, Crystal M, NP  ?metoCLOPramide (REGLAN) 10 MG tablet Take 1 tablet (10 mg total) by mouth every 6 (six) hours as needed for nausea (nausea/headache). 03/23/21   Charlynne PanderYao, David Hsienta, MD  ?mirabegron ER (MYRBETRIQ) 25 MG TB24 tablet Take 25 mg by mouth daily.    [provider]  ?naproxen (NAPROSYN) 250 MG tablet Take 1 tablet (250 mg total) by mouth 2 (two) times daily with a meal. 02/05/21   Linwood DibblesKnapp, Jon, MD  ?[facility] test [facility] Use 1-4 times daily as directed/needed   DX E11.9 ?Patient not taking: No sig reported 04/25/20   Barbette MerinoKing, Crystal M, NP  ?potassium chloride (KLOR-CON M) 20  MEQ tablet Take 0.5 tablets (10 mEq total) by mouth daily. 02/25/21   Dione BoozeGlick, David, MD  ?prednisoLONE acetate (PRED FORTE) 1 % ophthalmic suspension Place 1 drop into the right eye 4 (four) times daily. ?Patient not taking: Reported on 02/01/2021 06/28/20   [provider]  ?predniSONE (DELTASONE) 50 MG tablet Take 1 tablet (50 mg total) by mouth daily. 03/10/21   Dione BoozeGlick, David, MD  ?rosuvastatin (CRESTOR) 10 MG tablet Take 1 tablet (10 mg total) by mouth at bedtime. 10/05/20 10/05/21  Barbette MerinoKing, Crystal M, NP  ?traMADol (ULTRAM) 50 MG tablet Take 1 tablet (50 mg total) by mouth every 6 (six) hours as needed. 1-2 tablets every 6 hours as needed for pain 03/30/21   Arby BarrettePfeiffer, Marcy, MD  ?   ? ?Allergies    ?Codeine, Ibuprofen, and Imdur [isosorbide nitrate]   ? ?Review of Systems   ?Review of Systems  ?Constitutional:  Negative for chills and fever.  ?Respiratory:  Negative for shortness of breath.   ?Cardiovascular:  Negative for chest pain.  ?Neurological:  Positive for headaches. Negative for seizures, syncope, weakness and numbness.  ?All other systems reviewed and are negative. ? ?Physical Exam ?Updated Vital Signs ?BP (!) 156/65 (BP Location: Right Arm)   Pulse 79   Temp 99.4 ?F (37.4 ?C)   Resp 15   SpO2 100%  ?Physical Exam ?Vitals and nursing note reviewed.  ?Constitutional:   ?   General: She is not in acute distress. ?   Appearance: She is well-developed. She is not toxic-appearing.  ?HENT:  ?   Head: Normocephalic and atraumatic.  ?Eyes:  ?   General:     ?   Right eye: No discharge.     ?   Left eye: No discharge.  ?   Conjunctiva/sclera: Conjunctivae normal.  ?Cardiovascular:  ?   Rate and Rhythm: Normal rate and regular rhythm.  ?Pulmonary:  ?   Effort: No respiratory distress.  ?   Breath sounds: Normal breath sounds. No wheezing or rales.  ?Abdominal:  ?   General: There is no distension.  ?   Palpations: Abdomen is soft.  ?   Tenderness: There is no abdominal tenderness.  ?Musculoskeletal:  ?   Cervical  back: Neck supple.  ?Skin: ?   General: Skin is warm and dry.  ?Neurological:  ?   Mental Status: She is alert.  ?   Comments: Clear speech.  CN II through XII grossly intact.  No facial droop.  Sensation grossly tact bilateral upper and lower extremities.  5 out of 5 symmetric grip strength.  5  out of 5 strength with plantar dorsiflexion bilaterally.  Ambulatory.  Intact finger-nose.  ?Psychiatric:     ?   Behavior: Behavior normal.  ? ? ?ED Results / Procedures / Treatments   ?  Labs ?(all labs ordered are listed, but only abnormal results are displayed) ?Labs Reviewed - No data to display ? ?EKG ?None ? ?Radiology ?No results found. ? ?Procedures ?Procedures  ? ? ?Medications Ordered in ED ?Medications - No data to display ? ?ED Course/ Medical Decision Making/ A&P ?  ?                        ?Medical Decision Making ?Risk ?OTC drugs. ? ? ?Patient presents to the emergency department with complaints of headache. She is nontoxic, resting comfortably, her vitals are notable for elevated blood pressure which seems similar to her prior on record. ?  ?Chart reviewed for additional history, patient with 39 ED visits in the past 6 months.  She has had multiple recent head CTs without acute abnormality most recently 05/03/21- I viewed this image in external records.  ? ?Currently in the ED patient has no acute focal neurologic deficits, her hypertension seems similar to prior, I have a low suspicion for head bleed, additionally low suspicion for acute CVA. Afebrile without nuchal rigidity- doubt meningitis. Overall low suspicion for acute emergent pathology.   She was given Tylenol for headache.  She overall appears appropriate for discharge at this time. Findings & plan of care discussed w/ Dr. Eudelia Bunch who has evaluated patient as shared visit.  ?  ?We discussed treatment plan, need for follow-up, and return precautions with the patient. Patient confirmed understanding and is in agreement with plan.  ?  ?Final Clinical  Impression(s) / ED Diagnoses ?Final diagnoses:  ?Chronic nonintractable headache, unspecified headache type  ? ? ?Rx / DC Orders ?ED Discharge Orders   ? ? None  ? ?  ? ? ?  ?Cherly Anderson, PA-C ?05/09/21

## 2021-05-10 ENCOUNTER — Encounter (HOSPITAL_COMMUNITY): Payer: Self-pay

## 2021-05-10 ENCOUNTER — Other Ambulatory Visit: Payer: Self-pay

## 2021-05-10 ENCOUNTER — Emergency Department (HOSPITAL_COMMUNITY)
Admission: EM | Admit: 2021-05-10 | Discharge: 2021-05-10 | Disposition: A | Discharge status: Home / Self Care | Payer: Medicare Other | Attending: Emergency Medicine | Admitting: Emergency Medicine

## 2021-05-10 DIAGNOSIS — R519 Headache, unspecified: Secondary | ICD-10-CM | POA: Insufficient documentation

## 2021-05-10 DIAGNOSIS — Z7982 Long term (current) use of aspirin: Secondary | ICD-10-CM | POA: Diagnosis not present

## 2021-05-10 DIAGNOSIS — M79605 Pain in left leg: Secondary | ICD-10-CM | POA: Diagnosis not present

## 2021-05-10 DIAGNOSIS — G8929 Other chronic pain: Secondary | ICD-10-CM | POA: Diagnosis not present

## 2021-05-10 DIAGNOSIS — E119 Type 2 diabetes mellitus without complications: Secondary | ICD-10-CM | POA: Insufficient documentation

## 2021-05-10 DIAGNOSIS — Z7984 Long term (current) use of oral hypoglycemic drugs: Secondary | ICD-10-CM | POA: Diagnosis not present

## 2021-05-10 DIAGNOSIS — Z7902 Long term (current) use of antithrombotics/antiplatelets: Secondary | ICD-10-CM | POA: Diagnosis not present

## 2021-05-10 DIAGNOSIS — M79672 Pain in left foot: Secondary | ICD-10-CM | POA: Insufficient documentation

## 2021-05-10 MED ORDER — ACETAMINOPHEN 500 MG PO TABS
1000.0000 mg | ORAL_TABLET | Freq: Once | ORAL | Status: AC
  Administered 2021-05-10: 1000 mg via ORAL
  Filled 2021-05-10: qty 2

## 2021-05-10 NOTE — Discharge Instructions (Signed)
You were seen in the emergency department for headache and leg pain. ? ?We have given you some tylenol and a knee brace. If your pain continues I recommend following up with the primary care doctor provided to you on your previous visits.  ?

## 2021-05-10 NOTE — ED Triage Notes (Signed)
Pt presents to ED with complaint of headache, foot pain and leg pain. Seen at [facility] x 2 days ago for same.  ?

## 2021-05-10 NOTE — ED Provider Notes (Signed)
?[facility] DEPT ?Provider Note ? ? ?CSN: [id number] ?Arrival date & time: 05/10/21  1955 ? ?  ? ?History ? ?Chief Complaint  ?Patient presents with  ? Leg Pain  ? ? ?[name] is a 76 y.o. female. ? ?Patient is very well-known to this department.  She presents with complaint of headache, left foot and left leg pain.  She is requesting an Ace wrap as well as a knee immobilizer for her left knee, she believes this will help her chronic pain.  She states that she chronically has headaches, this headache feels similar.  No aggravating or alleviating factors.  Denies any injury. ? ? ?Leg Pain ? ?  ? ?Home Medications ?Prior to Admission medications   ?Medication Sig Start Date End Date Taking? Authorizing Provider  ?acetaminophen (TYLENOL) 500 MG tablet Take 2 tablets (1,000 mg total) by mouth every 6 (six) hours as needed. 03/30/21   Charlesetta Shanks, MD  ?albuterol (VENTOLIN HFA) 108 (90 Base) MCG/ACT inhaler Inhale 1-2 puffs into the lungs every 6 (six) hours as needed for wheezing or shortness of breath. 04/22/20   Vevelyn Francois, NP  ?aspirin EC 81 MG tablet Take 81 mg by mouth daily. Swallow whole.    [provider]  ?cephALEXin (KEFLEX) 500 MG capsule Take 1 capsule (500 mg total) by mouth 4 (four) times daily. 03/28/21   Regan Lemming, MD  ?chlorthalidone (HYGROTON) 25 MG tablet Take 1 tablet (25 mg total) by mouth daily. 10/05/20 10/05/21  Vevelyn Francois, NP  ?clopidogrel (PLAVIX) 75 MG tablet Take 1 tablet (75 mg total) by mouth daily. 04/22/20   Vevelyn Francois, NP  ?diclofenac Sodium (VOLTAREN) 1 % GEL Apply 2 g topically 4 (four) times daily as needed. 03/23/21   Drenda Freeze, MD  ?diphenhydramine-acetaminophen (TYLENOL PM) 25-500 MG TABS tablet Take 0.5 tablets by mouth at bedtime as needed. 1/2 tablet    [provider]  ?gabapentin (NEURONTIN) 100 MG capsule Take 1 capsule (100 mg total) by mouth 3 (three) times daily as needed for up to 30 doses. 05/02/21    Wyvonnia Dusky, MD  ?gabapentin (NEURONTIN) 300 MG capsule Take 1 capsule (300 mg total) by mouth 3 (three) times daily. ?Patient taking differently: Take 300 mg by mouth daily. 10/05/20   Vevelyn Francois, NP  ?gatifloxacin (ZYMAXID) 0.5 % SOLN Place 1 drop into the right eye 4 (four) times daily. 06/28/20   [provider]  ?glipiZIDE (GLUCOTROL) 5 MG tablet Take 1 tablet (5 mg total) by mouth 2 (two) times daily before a meal. 04/22/20 04/22/21  Vevelyn Francois, NP  ?hydrALAZINE (APRESOLINE) 50 MG tablet Take 1 tablet (50 mg total) by mouth 2 (two) times daily. ?Patient taking differently: Take 50 mg by mouth daily. 04/22/20   Vevelyn Francois, NP  ?ketorolac (ACULAR) 0.5 % ophthalmic solution Place 1 drop into the right eye 4 (four) times daily. ?Patient not taking: Reported on 02/01/2021 06/28/20   [provider]  ?lidocaine (LIDODERM) 5 % Place 1 patch onto the skin daily. Remove & Discard patch within 12 hours or as directed by MD 01/10/21   Margarita Mail, PA-C  ?lidocaine (LIDODERM) 5 % Place 1 patch onto the skin daily. Remove & Discard patch within 12 hours or as directed by MD 05/06/21   Quintella Reichert, MD  ?losartan (COZAAR) 50 MG tablet Take 1 tablet (50 mg total) by mouth daily. 10/05/20 10/05/21  Vevelyn Francois, NP  ?metFORMIN (  GLUCOPHAGE) 500 MG tablet Take 2 tablets (1,000 mg total) by mouth 2 (two) times daily with a meal. ?Patient taking differently: Take 1,000 mg by mouth daily with breakfast. 10/05/20 10/05/21  Vevelyn Francois, NP  ?metoCLOPramide (REGLAN) 10 MG tablet Take 1 tablet (10 mg total) by mouth every 6 (six) hours as needed for nausea (nausea/headache). 03/23/21   Drenda Freeze, MD  ?mirabegron ER (MYRBETRIQ) 25 MG TB24 tablet Take 25 mg by mouth daily.    [provider]  ?naproxen (NAPROSYN) 250 MG tablet Take 1 tablet (250 mg total) by mouth 2 (two) times daily with a meal. 02/05/21   Dorie Rank, MD  ?[facility] VERIO test strip Use 1-4 times daily as  directed/needed   DX E11.9 ?Patient not taking: No sig reported 04/25/20   Vevelyn Francois, NP  ?potassium chloride (KLOR-CON M) 20 MEQ tablet Take 0.5 tablets (10 mEq total) by mouth daily. [id number]   Delora Fuel, MD  ?prednisoLONE acetate (PRED FORTE) 1 % ophthalmic suspension Place 1 drop into the right eye 4 (four) times daily. ?Patient not taking: Reported on 02/01/2021 06/28/20   [provider]  ?predniSONE (DELTASONE) 50 MG tablet Take 1 tablet (50 mg total) by mouth daily. [id number]   Delora Fuel, MD  ?rosuvastatin (CRESTOR) 10 MG tablet Take 1 tablet (10 mg total) by mouth at bedtime. 10/05/20 10/05/21  Vevelyn Francois, NP  ?traMADol (ULTRAM) 50 MG tablet Take 1 tablet (50 mg total) by mouth every 6 (six) hours as needed. 1-2 tablets every 6 hours as needed for pain 03/30/21   Charlesetta Shanks, MD  ?   ? ?Allergies    ?Codeine, Ibuprofen, and Imdur [isosorbide nitrate]   ? ?Review of Systems   ?Review of Systems  ?Musculoskeletal:   ?     Left leg pain  ?Neurological:  Positive for headaches.  ?All other systems reviewed and are negative. ? ?Physical Exam ?Updated Vital Signs ?BP (!) 152/60 (BP Location: Right Arm)   Pulse 80   Temp 98.1 ?F (36.7 ?C) (Oral)   Resp 20   Ht 5\' 4"  (1.626 m)   Wt 74.8 kg   SpO2 100%   BMI 28.32 kg/m?  ?Physical Exam ?Vitals and nursing note reviewed.  ?Constitutional:   ?   Appearance: Normal appearance.  ?HENT:  ?   Head: Normocephalic and atraumatic.  ?Eyes:  ?   Conjunctiva/sclera: Conjunctivae normal.  ?Cardiovascular:  ?   Pulses:     ?     Posterior tibial pulses are 2+ on the right side and 2+ on the left side.  ?Pulmonary:  ?   Effort: Pulmonary effort is normal. No respiratory distress.  ?Musculoskeletal:  ?   Comments: 5/5 strength in bilateral lower extremities. No deformities, contusions, abrasions, punctures/penetrations, burns, tenderness, lacerations or swelling noted to the bilateral legs. Sensation in tact bilaterally. ?  ?Skin: ?   General: Skin is warm  and dry.  ?Neurological:  ?   Mental Status: She is alert.  ?   Comments: Neuro: Speech is clear, able to follow commands. CN III-XII intact grossly intact. PERRLA. EOMI. Sensation intact throughout. Str 5/5 all extremities.  ?Psychiatric:     ?   Mood and Affect: Mood normal.     ?   Behavior: Behavior normal.  ? ? ?ED Results / Procedures / Treatments   ?Labs ?(all labs ordered are listed, but only abnormal results are displayed) ?Labs Reviewed - No data to display ? ?EKG ?  None ? ?Radiology ?No results found. ? ?Procedures ?Procedures  ? ? ?Medications Ordered in ED ?Medications  ?acetaminophen (TYLENOL) tablet 1,000 mg (1,000 mg Oral Given 05/10/21 2032)  ? ? ?ED Course/ Medical Decision Making/ A&P ?  ?                        ?Medical Decision Making ?Risk ?OTC drugs. ? ? ?This patient is a 76 year old female who presents to the ED for concern of headache and left leg pain.  ? ?Differential diagnoses prior to evaluation: ?Intracranial hemorrhage, meningitis, temporal arteritis, glaucoma, cerebral ischemia, carotid/vertebral dissection, intracranial tumor, venous sinus thrombosis, carbon monoxide poisoning.  Other considerations include: migraine, cluster headache, hypertension, withdrawal (caffeine, alcohol, drug), pseudotumor cerebri, arteriovenous malformation, head injury, tension headache, sinusitis, cervical arthritis, dental abscess, otitis media, TMJ, depression, trigeminal neuralgia. ? ?Past Medical History / Co-morbidities / Social History: ?GERD, diabetes mellitus, depression, chronic pain ? ?Additional history: ?Chart reviewed for additional history, patient with 40 ED visits in the past 6 months.  She had multiple head CTs without acute abnormality, most recently on 05/03/2021. ? ?Physical Exam: ?Physical exam performed. The pertinent findings include: Patient with no deformities or focal tenderness to palpation of the bilateral lower legs.  Normal strength and sensation, with good distal pulses.   Normal neurologic exam as above. ? ?Medications: ?Patient given Tylenol for headache. ?  ?Disposition: ?After consideration of the diagnostic results and the patients response to treatment, I feel that patient's not requiring

## 2021-05-10 NOTE — Progress Notes (Signed)
Orthopedic Tech Progress Note ?Patient Details:  ?[name] ?1945-05-22 ?[id number] ? ?Patient ID: [name], female   DOB: [birth year], 76 y.o.   MRN: [id number] ? ?[name] ?05/10/2021, 8:42 PM ?Patient declined knee immobilizer. Ace applied to left knee. RN made aware.  ?

## 2021-05-11 ENCOUNTER — Encounter (HOSPITAL_COMMUNITY): Payer: Self-pay | Admitting: Pharmacy Technician

## 2021-05-11 ENCOUNTER — Other Ambulatory Visit: Payer: Self-pay

## 2021-05-11 ENCOUNTER — Emergency Department (HOSPITAL_COMMUNITY)
Admission: EM | Admit: 2021-05-11 | Discharge: 2021-05-11 | Disposition: A | Discharge status: Home / Self Care | Payer: Medicare Other | Attending: Emergency Medicine | Admitting: Emergency Medicine

## 2021-05-11 DIAGNOSIS — R519 Headache, unspecified: Secondary | ICD-10-CM | POA: Diagnosis present

## 2021-05-11 DIAGNOSIS — Z7982 Long term (current) use of aspirin: Secondary | ICD-10-CM | POA: Diagnosis not present

## 2021-05-11 NOTE — ED Provider Notes (Signed)
?[facility] ?Provider Note ? ? ?CSN: [id number] ?Arrival date & time: 05/11/21  1523 ? ?  ?History ? ?headache ? ?[name] is a 76 y.o. female known chronic headache well known to ED here for evaluation of headache.  Patient states she had headache earlier today, self resolved prior to my evaluation without any meds.  No "sudden onset" headache.  No recent traumatic injury.  States she wants to get "checked out." No current HA, vision changes, neck pain, dizziness, CP, SOB, abd pain, numbness, weakness, swelling. ? ?HPI ? ?  ? ?Home Medications ?Prior to Admission medications   ?Medication Sig Start Date End Date Taking? Authorizing Provider  ?acetaminophen (TYLENOL) 500 MG tablet Take 2 tablets (1,000 mg total) by mouth every 6 (six) hours as needed. 03/30/21   Charlesetta Shanks, MD  ?albuterol (VENTOLIN HFA) 108 (90 Base) MCG/ACT inhaler Inhale 1-2 puffs into the lungs every 6 (six) hours as needed for wheezing or shortness of breath. 04/22/20   Vevelyn Francois, NP  ?aspirin EC 81 MG tablet Take 81 mg by mouth daily. Swallow whole.    [provider]  ?cephALEXin (KEFLEX) 500 MG capsule Take 1 capsule (500 mg total) by mouth 4 (four) times daily. 03/28/21   Regan Lemming, MD  ?chlorthalidone (HYGROTON) 25 MG tablet Take 1 tablet (25 mg total) by mouth daily. 10/05/20 10/05/21  Vevelyn Francois, NP  ?clopidogrel (PLAVIX) 75 MG tablet Take 1 tablet (75 mg total) by mouth daily. 04/22/20   Vevelyn Francois, NP  ?diclofenac Sodium (VOLTAREN) 1 % GEL Apply 2 g topically 4 (four) times daily as needed. 03/23/21   Drenda Freeze, MD  ?diphenhydramine-acetaminophen (TYLENOL PM) 25-500 MG TABS tablet Take 0.5 tablets by mouth at bedtime as needed. 1/2 tablet    [provider]  ?gabapentin (NEURONTIN) 100 MG capsule Take 1 capsule (100 mg total) by mouth 3 (three) times daily as needed for up to 30 doses. 05/02/21   Wyvonnia Dusky, MD  ?gabapentin (NEURONTIN) 300 MG  capsule Take 1 capsule (300 mg total) by mouth 3 (three) times daily. ?Patient taking differently: Take 300 mg by mouth daily. 10/05/20   Vevelyn Francois, NP  ?gatifloxacin (ZYMAXID) 0.5 % SOLN Place 1 drop into the right eye 4 (four) times daily. 06/28/20   [provider]  ?glipiZIDE (GLUCOTROL) 5 MG tablet Take 1 tablet (5 mg total) by mouth 2 (two) times daily before a meal. 04/22/20 04/22/21  Vevelyn Francois, NP  ?hydrALAZINE (APRESOLINE) 50 MG tablet Take 1 tablet (50 mg total) by mouth 2 (two) times daily. ?Patient taking differently: Take 50 mg by mouth daily. 04/22/20   Vevelyn Francois, NP  ?ketorolac (ACULAR) 0.5 % ophthalmic solution Place 1 drop into the right eye 4 (four) times daily. ?Patient not taking: Reported on 02/01/2021 06/28/20   [provider]  ?lidocaine (LIDODERM) 5 % Place 1 patch onto the skin daily. Remove & Discard patch within 12 hours or as directed by MD 01/10/21   Margarita Mail, PA-C  ?lidocaine (LIDODERM) 5 % Place 1 patch onto the skin daily. Remove & Discard patch within 12 hours or as directed by MD 05/06/21   Quintella Reichert, MD  ?losartan (COZAAR) 50 MG tablet Take 1 tablet (50 mg total) by mouth daily. 10/05/20 10/05/21  Vevelyn Francois, NP  ?metFORMIN (GLUCOPHAGE) 500 MG tablet Take 2 tablets (1,000 mg total) by mouth 2 (two) times daily with a meal. ?  Patient taking differently: Take 1,000 mg by mouth daily with breakfast. 10/05/20 10/05/21  Vevelyn Francois, NP  ?metoCLOPramide (REGLAN) 10 MG tablet Take 1 tablet (10 mg total) by mouth every 6 (six) hours as needed for nausea (nausea/headache). 03/23/21   Drenda Freeze, MD  ?mirabegron ER (MYRBETRIQ) 25 MG TB24 tablet Take 25 mg by mouth daily.    [provider]  ?naproxen (NAPROSYN) 250 MG tablet Take 1 tablet (250 mg total) by mouth 2 (two) times daily with a meal. 02/05/21   Dorie Rank, MD  ?[facility] VERIO test strip Use 1-4 times daily as directed/needed   DX E11.9 ?Patient not taking: No sig reported  04/25/20   Vevelyn Francois, NP  ?potassium chloride (KLOR-CON M) 20 MEQ tablet Take 0.5 tablets (10 mEq total) by mouth daily. [id number]   Delora Fuel, MD  ?prednisoLONE acetate (PRED FORTE) 1 % ophthalmic suspension Place 1 drop into the right eye 4 (four) times daily. ?Patient not taking: Reported on 02/01/2021 06/28/20   [provider]  ?predniSONE (DELTASONE) 50 MG tablet Take 1 tablet (50 mg total) by mouth daily. [id number]   Delora Fuel, MD  ?rosuvastatin (CRESTOR) 10 MG tablet Take 1 tablet (10 mg total) by mouth at bedtime. 10/05/20 10/05/21  Vevelyn Francois, NP  ?traMADol (ULTRAM) 50 MG tablet Take 1 tablet (50 mg total) by mouth every 6 (six) hours as needed. 1-2 tablets every 6 hours as needed for pain 03/30/21   Charlesetta Shanks, MD  ?   ? ?Allergies    ?Codeine, Ibuprofen, and Imdur [isosorbide nitrate]   ? ?Review of Systems   ?Review of Systems  ?Constitutional: Negative.   ?HENT: Negative.    ?Respiratory: Negative.    ?Cardiovascular: Negative.   ?Gastrointestinal: Negative.   ?Genitourinary: Negative.   ?Musculoskeletal: Negative.   ?Skin: Negative.   ?Neurological:  Positive for headaches.  ?All other systems reviewed and are negative. ? ?Physical Exam ?Updated Vital Signs ?BP (!) 156/63 (BP Location: Right Arm)   Pulse 86   Temp 98.7 ?F (37.1 ?C) (Oral)   Resp 18   SpO2 95%  ?Physical Exam ?Physical Exam  ?Constitutional: Pt is oriented to person, place, and time. Pt appears well-developed and well-nourished. No distress.  ?HENT:  ?Head: Normocephalic and atraumatic.  ?Mouth/Throat: Oropharynx is clear and moist.  ?Eyes: Conjunctivae and EOM are normal. Pupils are equal, round, and reactive to light. No scleral icterus.  ?No horizontal, vertical or rotational nystagmus  ?Neck: Normal range of motion. Neck supple.  ?Full active and passive ROM without pain ?No midline or paraspinal tenderness ?No nuchal rigidity or meningeal signs  ?Cardiovascular: Normal rate, regular rhythm and intact distal  pulses.   ?Pulmonary/Chest: Effort normal and breath sounds normal. No respiratory distress. Pt has no wheezes. No rales.  ?Abdominal: Soft. Bowel sounds are normal. There is no tenderness. There is no rebound and no guarding.  ?Musculoskeletal: Normal range of motion.  ?Lymphadenopathy:  ?  No cervical adenopathy.  ?Neurological: Pt. is alert and oriented to person, place, and time. He has normal reflexes. No cranial nerve deficit.  Exhibits normal muscle tone. Coordination normal.  ?Mental Status:  ?Alert, oriented, thought content appropriate. Speech fluent without evidence of aphasia. Able to follow 2 step commands without difficulty.  ?Cranial Nerves:  ?II:  Peripheral visual fields grossly normal, pupils equal, round, reactive to light ?III,IV, VI: ptosis not present, extra-ocular motions intact bilaterally  ?V,VII: smile symmetric, facial light touch sensation equal ?VIII:  hearing grossly normal bilaterally  ?IX,X: midline uvula rise  ?XI: bilateral shoulder shrug equal and strong ?XII: midline tongue extension  ?Motor:  ?5/5 in upper and lower extremities bilaterally including strong and equal grip strength and dorsiflexion/plantar flexion ?Sensory: Pinprick and light touch normal in all extremities.  ?Deep Tendon Reflexes: 2+ and symmetric  ?Cerebellar: normal finger-to-nose with bilateral upper extremities ?Gait: normal gait and balance ?CV: distal pulses palpable throughout   ?Skin: Skin is warm and dry. No rash noted. Pt is not diaphoretic.  ?Psychiatric: Pt has a normal mood and affect. Behavior is normal. Judgment and thought content normal.  ?Nursing note and vitals reviewed.  ?ED Results / Procedures / Treatments   ?Labs ?(all labs ordered are listed, but only abnormal results are displayed) ?Labs Reviewed - No data to display ? ?EKG ?None ? ?Radiology ?No results found. ? ?Procedures ?Procedures  ? ? ?Medications Ordered in ED ?Medications - No data to display ? ?ED Course/ Medical Decision Making/  A&P ?  ? ?76 year old well-known here to emergency department here for evaluation of headache.  She is afebrile, nonseptic, not ill-appearing.  Patient states she has had headache over the last few months.  Headache

## 2021-05-11 NOTE — ED Triage Notes (Signed)
Pt here with headache for the last few weeks. Pt has not tried any OTC medications today.  ?

## 2021-05-12 ENCOUNTER — Other Ambulatory Visit: Payer: Self-pay

## 2021-05-12 ENCOUNTER — Encounter (HOSPITAL_COMMUNITY): Payer: Self-pay | Admitting: Emergency Medicine

## 2021-05-12 ENCOUNTER — Emergency Department (HOSPITAL_COMMUNITY)
Admission: EM | Admit: 2021-05-12 | Discharge: 2021-05-12 | Disposition: A | Discharge status: Home / Self Care | Payer: Medicare Other | Attending: Emergency Medicine | Admitting: Emergency Medicine

## 2021-05-12 DIAGNOSIS — I10 Essential (primary) hypertension: Secondary | ICD-10-CM

## 2021-05-12 DIAGNOSIS — Z7984 Long term (current) use of oral hypoglycemic drugs: Secondary | ICD-10-CM | POA: Insufficient documentation

## 2021-05-12 DIAGNOSIS — Z7902 Long term (current) use of antithrombotics/antiplatelets: Secondary | ICD-10-CM | POA: Insufficient documentation

## 2021-05-12 DIAGNOSIS — R519 Headache, unspecified: Secondary | ICD-10-CM | POA: Diagnosis present

## 2021-05-12 DIAGNOSIS — Z7982 Long term (current) use of aspirin: Secondary | ICD-10-CM | POA: Insufficient documentation

## 2021-05-12 NOTE — ED Triage Notes (Signed)
Pt BIB EMS from home with c/o headache x a few weeks. Pt seen here earlier today.  ?

## 2021-05-12 NOTE — ED Provider Notes (Signed)
?[facility] EMERGENCY DEPARTMENT ?Provider Note ? ? ?CSN: [id number] ?Arrival date & time: 05/12/21  0056 ? ?  ? ?History ? ?Chief Complaint  ?Patient presents with  ? Headache  ? ? ?[name] is a 76 y.o. female. ? ?Patient presents to the emergency department for evaluation of headache.  Patient has frequent, daily headaches.  She reports that her headache generally happens when her blood pressure goes up.  This is consistent with her daily headache, no new findings today. ? ? ?  ? ?Home Medications ?Prior to Admission medications   ?Medication Sig Start Date End Date Taking? Authorizing Provider  ?acetaminophen (TYLENOL) 500 MG tablet Take 2 tablets (1,000 mg total) by mouth every 6 (six) hours as needed. 03/30/21   Arby Barrette, MD  ?albuterol (VENTOLIN HFA) 108 (90 Base) MCG/ACT inhaler Inhale 1-2 puffs into the lungs every 6 (six) hours as needed for wheezing or shortness of breath. 04/22/20   Barbette Merino, NP  ?aspirin EC 81 MG tablet Take 81 mg by mouth daily. Swallow whole.    [provider]  ?cephALEXin (KEFLEX) 500 MG capsule Take 1 capsule (500 mg total) by mouth 4 (four) times daily. 03/28/21   Ernie Avena, MD  ?chlorthalidone (HYGROTON) 25 MG tablet Take 1 tablet (25 mg total) by mouth daily. 10/05/20 10/05/21  Barbette Merino, NP  ?clopidogrel (PLAVIX) 75 MG tablet Take 1 tablet (75 mg total) by mouth daily. 04/22/20   Barbette Merino, NP  ?diclofenac Sodium (VOLTAREN) 1 % GEL Apply 2 g topically 4 (four) times daily as needed. 03/23/21   [name], MD  ?diphenhydramine-acetaminophen (TYLENOL PM) 25-500 MG TABS tablet Take 0.5 tablets by mouth at bedtime as needed. 1/2 tablet    [provider]  ?gabapentin (NEURONTIN) 100 MG capsule Take 1 capsule (100 mg total) by mouth 3 (three) times daily as needed for up to 30 doses. 05/02/21   Terald Sleeper, MD  ?gabapentin (NEURONTIN) 300 MG capsule Take 1 capsule (300 mg total) by mouth 3 (three) times  daily. ?Patient taking differently: Take 300 mg by mouth daily. 10/05/20   Barbette Merino, NP  ?gatifloxacin (ZYMAXID) 0.5 % SOLN Place 1 drop into the right eye 4 (four) times daily. 06/28/20   [provider]  ?glipiZIDE (GLUCOTROL) 5 MG tablet Take 1 tablet (5 mg total) by mouth 2 (two) times daily before a meal. 04/22/20 04/22/21  Barbette Merino, NP  ?hydrALAZINE (APRESOLINE) 50 MG tablet Take 1 tablet (50 mg total) by mouth 2 (two) times daily. ?Patient taking differently: Take 50 mg by mouth daily. 04/22/20   Barbette Merino, NP  ?ketorolac (ACULAR) 0.5 % ophthalmic solution Place 1 drop into the right eye 4 (four) times daily. ?Patient not taking: Reported on 02/01/2021 06/28/20   [provider]  ?lidocaine (LIDODERM) 5 % Place 1 patch onto the skin daily. Remove & Discard patch within 12 hours or as directed by MD 01/10/21   Arthor Captain, PA-C  ?lidocaine (LIDODERM) 5 % Place 1 patch onto the skin daily. Remove & Discard patch within 12 hours or as directed by MD 05/06/21   Tilden Fossa, MD  ?losartan (COZAAR) 50 MG tablet Take 1 tablet (50 mg total) by mouth daily. 10/05/20 10/05/21  Barbette Merino, NP  ?metFORMIN (GLUCOPHAGE) 500 MG tablet Take 2 tablets (1,000 mg total) by mouth 2 (two) times daily with a meal. ?Patient taking differently: Take 1,000 mg by mouth daily  with breakfast. 10/05/20 10/05/21  Barbette Merino, NP  ?metoCLOPramide (REGLAN) 10 MG tablet Take 1 tablet (10 mg total) by mouth every 6 (six) hours as needed for nausea (nausea/headache). 03/23/21   [name], MD  ?mirabegron ER (MYRBETRIQ) 25 MG TB24 tablet Take 25 mg by mouth daily.    [provider]  ?naproxen (NAPROSYN) 250 MG tablet Take 1 tablet (250 mg total) by mouth 2 (two) times daily with a meal. 02/05/21   Linwood Dibbles, MD  ?[facility] VERIO test strip Use 1-4 times daily as directed/needed   DX E11.9 ?Patient not taking: No sig reported 04/25/20   Barbette Merino, NP  ?potassium chloride (KLOR-CON M) 20  MEQ tablet Take 0.5 tablets (10 mEq total) by mouth daily. 02/25/21   Dione Booze, MD  ?prednisoLONE acetate (PRED FORTE) 1 % ophthalmic suspension Place 1 drop into the right eye 4 (four) times daily. ?Patient not taking: Reported on 02/01/2021 06/28/20   [provider]  ?predniSONE (DELTASONE) 50 MG tablet Take 1 tablet (50 mg total) by mouth daily. 03/10/21   Dione Booze, MD  ?rosuvastatin (CRESTOR) 10 MG tablet Take 1 tablet (10 mg total) by mouth at bedtime. 10/05/20 10/05/21  Barbette Merino, NP  ?traMADol (ULTRAM) 50 MG tablet Take 1 tablet (50 mg total) by mouth every 6 (six) hours as needed. 1-2 tablets every 6 hours as needed for pain 03/30/21   Arby Barrette, MD  ?   ? ?Allergies    ?Codeine, Ibuprofen, and Imdur [isosorbide nitrate]   ? ?Review of Systems   ?Review of Systems ? ?Physical Exam ?Updated Vital Signs ?BP (!) 158/60   Pulse 71   Temp 97.9 ?F (36.6 ?C) (Oral)   Resp 18   Ht 5\' 4"  (1.626 m)   Wt 74.8 kg   SpO2 99%   BMI 28.31 kg/m?  ?Physical Exam ? ?ED Results / Procedures / Treatments   ?Labs ?(all labs ordered are listed, but only abnormal results are displayed) ?Labs Reviewed - No data to display ? ?EKG ?None ? ?Radiology ?No results found. ? ?Procedures ?Procedures  ? ? ?Medications Ordered in ED ?Medications - No data to display ? ?ED Course/ Medical Decision Making/ A&P ?  ?                        ?Medical Decision Making ? ?Patient returns with headache.  She was already seen once today with this complaint.  Patient checked in with recurrent headache but by the time she was placed in an exam room when I saw her, she reports that her headache has resolved again.  She thinks that is mostly related to her blood pressure elevation.  Blood pressure is not significantly elevated at this time.  She has been worked up for this numerous times in the past and has been seen every 1 to 2 days for this same complaint.  I will once again refer her back to her primary care doctor for  definitive treatment. ? ? ? ? ? ? ? ?Final Clinical Impression(s) / ED Diagnoses ?Final diagnoses:  ?Chronic daily headache  ? ? ?Rx / DC Orders ?ED Discharge Orders   ? ? None  ? ?  ? ? ?  ? , MD ?05/12/21 [phone] ? ?

## 2021-05-16 ENCOUNTER — Other Ambulatory Visit: Payer: Self-pay

## 2021-05-16 ENCOUNTER — Emergency Department (HOSPITAL_COMMUNITY)
Admission: EM | Admit: 2021-05-16 | Discharge: 2021-05-17 | Disposition: A | Discharge status: Home / Self Care | Payer: Medicare Other | Attending: Emergency Medicine | Admitting: Emergency Medicine

## 2021-05-16 DIAGNOSIS — E119 Type 2 diabetes mellitus without complications: Secondary | ICD-10-CM | POA: Insufficient documentation

## 2021-05-16 DIAGNOSIS — Z7984 Long term (current) use of oral hypoglycemic drugs: Secondary | ICD-10-CM | POA: Insufficient documentation

## 2021-05-16 DIAGNOSIS — Z79899 Other long term (current) drug therapy: Secondary | ICD-10-CM | POA: Diagnosis not present

## 2021-05-16 DIAGNOSIS — R519 Headache, unspecified: Secondary | ICD-10-CM | POA: Insufficient documentation

## 2021-05-16 DIAGNOSIS — Z7982 Long term (current) use of aspirin: Secondary | ICD-10-CM | POA: Diagnosis not present

## 2021-05-16 DIAGNOSIS — M79605 Pain in left leg: Secondary | ICD-10-CM | POA: Insufficient documentation

## 2021-05-16 DIAGNOSIS — G8929 Other chronic pain: Secondary | ICD-10-CM | POA: Insufficient documentation

## 2021-05-16 DIAGNOSIS — I10 Essential (primary) hypertension: Secondary | ICD-10-CM | POA: Diagnosis not present

## 2021-05-16 NOTE — ED Triage Notes (Signed)
Pt here for HA, thinks her BP is high, states HA started a few days ago. Also c/o of L thigh leg, states she fell on a bus last week and has had pain since. Pt ambulatory in triage ?

## 2021-05-17 ENCOUNTER — Encounter (HOSPITAL_COMMUNITY): Payer: Self-pay

## 2021-05-17 ENCOUNTER — Emergency Department (HOSPITAL_COMMUNITY)
Admission: EM | Admit: 2021-05-17 | Discharge: 2021-05-18 | Disposition: A | Discharge status: Home / Self Care | Payer: Medicare Other | Attending: Emergency Medicine | Admitting: Emergency Medicine

## 2021-05-17 ENCOUNTER — Other Ambulatory Visit: Payer: Self-pay

## 2021-05-17 DIAGNOSIS — M79662 Pain in left lower leg: Secondary | ICD-10-CM | POA: Insufficient documentation

## 2021-05-17 DIAGNOSIS — G8929 Other chronic pain: Secondary | ICD-10-CM | POA: Diagnosis not present

## 2021-05-17 DIAGNOSIS — Z7982 Long term (current) use of aspirin: Secondary | ICD-10-CM | POA: Diagnosis not present

## 2021-05-17 DIAGNOSIS — M79661 Pain in right lower leg: Secondary | ICD-10-CM | POA: Diagnosis not present

## 2021-05-17 DIAGNOSIS — R519 Headache, unspecified: Secondary | ICD-10-CM | POA: Insufficient documentation

## 2021-05-17 DIAGNOSIS — M79604 Pain in right leg: Secondary | ICD-10-CM

## 2021-05-17 DIAGNOSIS — Z7902 Long term (current) use of antithrombotics/antiplatelets: Secondary | ICD-10-CM | POA: Insufficient documentation

## 2021-05-17 MED ORDER — ACETAMINOPHEN 325 MG PO TABS: 650.0000 mg | ORAL_TABLET | Freq: Four times a day (QID) | ORAL | Status: DC | PRN

## 2021-05-17 MED ORDER — LIDOCAINE 5 % EX PTCH: 1.0000 | MEDICATED_PATCH | CUTANEOUS | 0 refills | Status: DC

## 2021-05-17 MED ORDER — LIDOCAINE 5 % EX PTCH
1.0000 | MEDICATED_PATCH | CUTANEOUS | Status: DC
  Administered 2021-05-17: 1 via TRANSDERMAL
  Filled 2021-05-17: qty 1

## 2021-05-17 NOTE — ED Provider Notes (Signed)
?[facility] ?Provider Note ? ? ?CSN: [id number] ?Arrival date & time: 05/16/21  2153 ? ?  ? ?History ? ?Chief Complaint  ?Patient presents with  ? Leg Pain  ? Headache  ? ? ?[name] is a 76 y.o. female. ? ?HPI ? ?  ? ?This is a 76 year old female with a history of chronic headaches, CVA who presents with headache and left leg pain.  Patient reports she has generally a daily headache.  It is all over.  Character of her headache today is similar.  She states often it is associated with high blood pressure.  Currently, while resting, she does not have any headache.  She has not noted any new weakness, numbness, tingling, vision changes, strokelike symptoms.  No fevers or cough.  Patient states she has had worsening left leg pain especially with walking.  She reports history of prior stroke with left-sided mild deficits and "I have had pain since that time."  Denies any injury.  Patient reports that she normally has Lidoderm patches but has run out and is requesting more. ? ?Home Medications ?Prior to Admission medications   ?Medication Sig Start Date End Date Taking? Authorizing Provider  ?acetaminophen (TYLENOL) 500 MG tablet Take 2 tablets (1,000 mg total) by mouth every 6 (six) hours as needed. 03/30/21   Charlesetta Shanks, MD  ?albuterol (VENTOLIN HFA) 108 (90 Base) MCG/ACT inhaler Inhale 1-2 puffs into the lungs every 6 (six) hours as needed for wheezing or shortness of breath. 04/22/20   Vevelyn Francois, NP  ?aspirin EC 81 MG tablet Take 81 mg by mouth daily. Swallow whole.    [provider]  ?cephALEXin (KEFLEX) 500 MG capsule Take 1 capsule (500 mg total) by mouth 4 (four) times daily. 03/28/21   Regan Lemming, MD  ?chlorthalidone (HYGROTON) 25 MG tablet Take 1 tablet (25 mg total) by mouth daily. 10/05/20 10/05/21  Vevelyn Francois, NP  ?clopidogrel (PLAVIX) 75 MG tablet Take 1 tablet (75 mg total) by mouth daily. 04/22/20   Vevelyn Francois, NP  ?diclofenac Sodium  (VOLTAREN) 1 % GEL Apply 2 g topically 4 (four) times daily as needed. 03/23/21   Drenda Freeze, MD  ?diphenhydramine-acetaminophen (TYLENOL PM) 25-500 MG TABS tablet Take 0.5 tablets by mouth at bedtime as needed. 1/2 tablet    [provider]  ?gabapentin (NEURONTIN) 100 MG capsule Take 1 capsule (100 mg total) by mouth 3 (three) times daily as needed for up to 30 doses. 05/02/21   Wyvonnia Dusky, MD  ?gabapentin (NEURONTIN) 300 MG capsule Take 1 capsule (300 mg total) by mouth 3 (three) times daily. ?Patient taking differently: Take 300 mg by mouth daily. 10/05/20   Vevelyn Francois, NP  ?gatifloxacin (ZYMAXID) 0.5 % SOLN Place 1 drop into the right eye 4 (four) times daily. 06/28/20   [provider]  ?glipiZIDE (GLUCOTROL) 5 MG tablet Take 1 tablet (5 mg total) by mouth 2 (two) times daily before a meal. 04/22/20 04/22/21  Vevelyn Francois, NP  ?hydrALAZINE (APRESOLINE) 50 MG tablet Take 1 tablet (50 mg total) by mouth 2 (two) times daily. ?Patient taking differently: Take 50 mg by mouth daily. 04/22/20   Vevelyn Francois, NP  ?ketorolac (ACULAR) 0.5 % ophthalmic solution Place 1 drop into the right eye 4 (four) times daily. ?Patient not taking: Reported on 02/01/2021 06/28/20   [provider]  ?lidocaine (LIDODERM) 5 % Place 1 patch onto the skin daily. Remove &  Discard patch within 12 hours or as directed by MD 01/10/21   Margarita Mail, PA-C  ?lidocaine (LIDODERM) 5 % Place 1 patch onto the skin daily. Remove & Discard patch within 12 hours or as directed by MD 05/06/21   Quintella Reichert, MD  ?losartan (COZAAR) 50 MG tablet Take 1 tablet (50 mg total) by mouth daily. 10/05/20 10/05/21  Vevelyn Francois, NP  ?metFORMIN (GLUCOPHAGE) 500 MG tablet Take 2 tablets (1,000 mg total) by mouth 2 (two) times daily with a meal. ?Patient taking differently: Take 1,000 mg by mouth daily with breakfast. 10/05/20 10/05/21  Vevelyn Francois, NP  ?metoCLOPramide (REGLAN) 10 MG tablet Take 1 tablet (10 mg total)  by mouth every 6 (six) hours as needed for nausea (nausea/headache). 03/23/21   Drenda Freeze, MD  ?mirabegron ER (MYRBETRIQ) 25 MG TB24 tablet Take 25 mg by mouth daily.    [provider]  ?naproxen (NAPROSYN) 250 MG tablet Take 1 tablet (250 mg total) by mouth 2 (two) times daily with a meal. 02/05/21   Dorie Rank, MD  ?[facility] VERIO test strip Use 1-4 times daily as directed/needed   DX E11.9 ?Patient not taking: No sig reported 04/25/20   Vevelyn Francois, NP  ?potassium chloride (KLOR-CON M) 20 MEQ tablet Take 0.5 tablets (10 mEq total) by mouth daily. [id number]   Delora Fuel, MD  ?prednisoLONE acetate (PRED FORTE) 1 % ophthalmic suspension Place 1 drop into the right eye 4 (four) times daily. ?Patient not taking: Reported on 02/01/2021 06/28/20   [provider]  ?predniSONE (DELTASONE) 50 MG tablet Take 1 tablet (50 mg total) by mouth daily. [id number]   Delora Fuel, MD  ?rosuvastatin (CRESTOR) 10 MG tablet Take 1 tablet (10 mg total) by mouth at bedtime. 10/05/20 10/05/21  Vevelyn Francois, NP  ?traMADol (ULTRAM) 50 MG tablet Take 1 tablet (50 mg total) by mouth every 6 (six) hours as needed. 1-2 tablets every 6 hours as needed for pain 03/30/21   Charlesetta Shanks, MD  ?   ? ?Allergies    ?Codeine, Ibuprofen, and Imdur [isosorbide nitrate]   ? ?Review of Systems   ?Review of Systems  ?Constitutional:  Negative for fever.  ?Respiratory:  Negative for shortness of breath.   ?Cardiovascular:  Negative for chest pain.  ?Gastrointestinal:  Negative for abdominal pain.  ?Genitourinary:  Negative for dysuria.  ?Musculoskeletal:   ?     Leg pain  ?Neurological:  Positive for headaches. Negative for dizziness, weakness and numbness.  ?All other systems reviewed and are negative. ? ?Physical Exam ?Updated Vital Signs ?BP (!) 157/115   Pulse 66   Temp 98.2 ?F (36.8 ?C) (Oral)   Resp 16   SpO2 100%  ?Physical Exam ?Vitals and nursing note reviewed.  ?Constitutional:   ?   Comments: Chronically ill-appearing  but nontoxic  ?HENT:  ?   Head: Normocephalic and atraumatic.  ?Eyes:  ?   Pupils: Pupils are equal, round, and reactive to light.  ?Cardiovascular:  ?   Rate and Rhythm: Normal rate and regular rhythm.  ?   Heart sounds: Normal heart sounds.  ?Pulmonary:  ?   Effort: Pulmonary effort is normal. No respiratory distress.  ?   Breath sounds: No wheezing.  ?Abdominal:  ?   General: Bowel sounds are normal.  ?   Palpations: Abdomen is soft.  ?Musculoskeletal:  ?   Cervical back: Normal range of motion and neck supple.  ?Skin: ?   General: Skin  is warm and dry.  ?Neurological:  ?   Mental Status: She is alert and oriented to person, place, and time.  ?   Comments: Fluent speech, cranial nerves II through XII intact, no objective asymmetric weakness  ?Psychiatric:     ?   Speech: Speech normal.  ? ? ?ED Results / Procedures / Treatments   ?Labs ?(all labs ordered are listed, but only abnormal results are displayed) ?Labs Reviewed - No data to display ? ?EKG ?None ? ?Radiology ?No results found. ? ?Procedures ?Procedures  ? ? ?Medications Ordered in ED ?Medications  ?acetaminophen (TYLENOL) tablet 650 mg (has no administration in time range)  ?lidocaine (LIDODERM) 5 % 1 patch (has no administration in time range)  ? ? ?ED Course/ Medical Decision Making/ A&P ?  ?                        ?Medical Decision Making ?Risk ?OTC drugs. ?Prescription drug management. ? ? ?This patient presents to the ED for concern of Headache, leg pain, this involves an extensive number of treatment options, and is a complaint that carries with it a high risk of complications and morbidity.  The differential diagnosis includes Known chronic headache, new headache, arthritis, less likely fracture ? ?MDM:   ? ?This is a 76 year old female with history of chronic headaches who presents with headache.  Reports she is no longer having any headache at this time.  States that she has run out of her Lidoderm patches and is requesting more.  She is  hypertensive to 157/115.I reviewed her chart.  This is within range of her previous blood pressures.  She has been seen and evaluated multiple times in the ED with multiple work-ups including head imaging and x-rays of

## 2021-05-17 NOTE — Discharge Instructions (Addendum)
You were seen today for ongoing headache.  You received a prescription for Lidoderm.  Continue Tylenol.  Follow-up with neurology. ?

## 2021-05-17 NOTE — ED Provider Notes (Signed)
?[facility] DEPT ?Provider Note ? ? ?CSN: [id number] ?Arrival date & time: 05/17/21  1947 ? ?  ? ?History ? ?Chief Complaint  ?Patient presents with  ? Headache  ? Leg Pain  ? ? ?[name] is a 76 y.o. female who presents today for evaluation of her chronic daily headache and leg pain.  She denies any changes to these or new symptoms.  She was seen for the same earlier this morning and discharged.  She was given lidocaine patches. She states that she got them from the pharmacy.  She tells me that she has over four patches on.  We discussed safe use of lidocaine patches including not wearing more than one patch and needing to leave them off for 12 hours to avoid toxicity.  ? ?She is concerned about her ability to get home.  ? ?HPI ? ?  ? ?Home Medications ?Prior to Admission medications   ?Medication Sig Start Date End Date Taking? Authorizing Provider  ?acetaminophen (TYLENOL) 500 MG tablet Take 2 tablets (1,000 mg total) by mouth every 6 (six) hours as needed. 03/30/21   Charlesetta Shanks, MD  ?albuterol (VENTOLIN HFA) 108 (90 Base) MCG/ACT inhaler Inhale 1-2 puffs into the lungs every 6 (six) hours as needed for wheezing or shortness of breath. 04/22/20   Vevelyn Francois, NP  ?aspirin EC 81 MG tablet Take 81 mg by mouth daily. Swallow whole.    [provider]  ?cephALEXin (KEFLEX) 500 MG capsule Take 1 capsule (500 mg total) by mouth 4 (four) times daily. 03/28/21   Regan Lemming, MD  ?chlorthalidone (HYGROTON) 25 MG tablet Take 1 tablet (25 mg total) by mouth daily. 10/05/20 10/05/21  Vevelyn Francois, NP  ?clopidogrel (PLAVIX) 75 MG tablet Take 1 tablet (75 mg total) by mouth daily. 04/22/20   Vevelyn Francois, NP  ?diclofenac Sodium (VOLTAREN) 1 % GEL Apply 2 g topically 4 (four) times daily as needed. 03/23/21   Drenda Freeze, MD  ?diphenhydramine-acetaminophen (TYLENOL PM) 25-500 MG TABS tablet Take 0.5 tablets by mouth at bedtime as needed. 1/2 tablet    [provider]  ?gabapentin (NEURONTIN) 100 MG capsule Take 1 capsule (100 mg total) by mouth 3 (three) times daily as needed for up to 30 doses. 05/02/21   Wyvonnia Dusky, MD  ?gabapentin (NEURONTIN) 300 MG capsule Take 1 capsule (300 mg total) by mouth 3 (three) times daily. ?Patient taking differently: Take 300 mg by mouth daily. 10/05/20   Vevelyn Francois, NP  ?gatifloxacin (ZYMAXID) 0.5 % SOLN Place 1 drop into the right eye 4 (four) times daily. 06/28/20   [provider]  ?glipiZIDE (GLUCOTROL) 5 MG tablet Take 1 tablet (5 mg total) by mouth 2 (two) times daily before a meal. 04/22/20 04/22/21  Vevelyn Francois, NP  ?hydrALAZINE (APRESOLINE) 50 MG tablet Take 1 tablet (50 mg total) by mouth 2 (two) times daily. ?Patient taking differently: Take 50 mg by mouth daily. 04/22/20   Vevelyn Francois, NP  ?ketorolac (ACULAR) 0.5 % ophthalmic solution Place 1 drop into the right eye 4 (four) times daily. ?Patient not taking: Reported on 02/01/2021 06/28/20   [provider]  ?lidocaine (LIDODERM) 5 % Place 1 patch onto the skin daily. Remove & Discard patch within 12 hours or as directed by MD 05/06/21   Quintella Reichert, MD  ?lidocaine (LIDODERM) 5 % Place 1 patch onto the skin daily. Remove & Discard patch within 12 hours or as  directed by MD 05/17/21   Merryl Hacker, MD  ?losartan (COZAAR) 50 MG tablet Take 1 tablet (50 mg total) by mouth daily. 10/05/20 10/05/21  Vevelyn Francois, NP  ?metFORMIN (GLUCOPHAGE) 500 MG tablet Take 2 tablets (1,000 mg total) by mouth 2 (two) times daily with a meal. ?Patient taking differently: Take 1,000 mg by mouth daily with breakfast. 10/05/20 10/05/21  Vevelyn Francois, NP  ?metoCLOPramide (REGLAN) 10 MG tablet Take 1 tablet (10 mg total) by mouth every 6 (six) hours as needed for nausea (nausea/headache). 03/23/21   Drenda Freeze, MD  ?mirabegron ER (MYRBETRIQ) 25 MG TB24 tablet Take 25 mg by mouth daily.    [provider]  ?naproxen (NAPROSYN) 250 MG tablet Take 1 tablet  (250 mg total) by mouth 2 (two) times daily with a meal. 02/05/21   Dorie Rank, MD  ?[facility] VERIO test strip Use 1-4 times daily as directed/needed   DX E11.9 ?Patient not taking: No sig reported 04/25/20   Vevelyn Francois, NP  ?potassium chloride (KLOR-CON M) 20 MEQ tablet Take 0.5 tablets (10 mEq total) by mouth daily. [id number]   Delora Fuel, MD  ?prednisoLONE acetate (PRED FORTE) 1 % ophthalmic suspension Place 1 drop into the right eye 4 (four) times daily. ?Patient not taking: Reported on 02/01/2021 06/28/20   [provider]  ?predniSONE (DELTASONE) 50 MG tablet Take 1 tablet (50 mg total) by mouth daily. [id number]   Delora Fuel, MD  ?rosuvastatin (CRESTOR) 10 MG tablet Take 1 tablet (10 mg total) by mouth at bedtime. 10/05/20 10/05/21  Vevelyn Francois, NP  ?traMADol (ULTRAM) 50 MG tablet Take 1 tablet (50 mg total) by mouth every 6 (six) hours as needed. 1-2 tablets every 6 hours as needed for pain 03/30/21   Charlesetta Shanks, MD  ?   ? ?Allergies    ?Codeine, Ibuprofen, and Imdur [isosorbide nitrate]   ? ?Review of Systems   ?Review of Systems ? ?Physical Exam ?Updated Vital Signs ?BP (!) 163/58   Pulse 70   Temp 98.3 ?F (36.8 ?C) (Oral)   Resp 17   Ht 5\' 4"  (1.626 m)   Wt 74.4 kg   SpO2 98%   BMI 28.15 kg/m?  ?Physical Exam ?Vitals and nursing note reviewed.  ?Constitutional:   ?   General: She is not in acute distress. ?   Appearance: She is not diaphoretic.  ?HENT:  ?   Head: Normocephalic and atraumatic.  ?Eyes:  ?   General: No scleral icterus.    ?   Right eye: No discharge.     ?   Left eye: No discharge.  ?   Conjunctiva/sclera: Conjunctivae normal.  ?Cardiovascular:  ?   Rate and Rhythm: Normal rate and regular rhythm.  ?Pulmonary:  ?   Effort: Pulmonary effort is normal. No respiratory distress.  ?   Breath sounds: No stridor.  ?Abdominal:  ?   General: There is no distension.  ?Musculoskeletal:     ?   General: No deformity.  ?   Cervical back: Normal range of motion.  ?Skin: ?   General:  Skin is warm and dry.  ?Neurological:  ?   Mental Status: She is alert and oriented to person, place, and time.  ?   GCS: GCS eye subscore is 4. GCS verbal subscore is 5. GCS motor subscore is 6.  ?   Motor: No abnormal muscle tone.  ?   Comments: Patient is awake and alert.  Speech is not slurred.  Moves all 4 extremities spontaneously. Vision baseline per patient.  ?Facial movements are symmetric. ?5/5 strength bilateral upper and lower extremities.  ?Psychiatric:     ?   Mood and Affect: Mood normal.     ?   Behavior: Behavior normal.  ? ? ?ED Results / Procedures / Treatments   ?Labs ?(all labs ordered are listed, but only abnormal results are displayed) ?Labs Reviewed - No data to display ? ?EKG ?None ? ?Radiology ?No results found. ? ?Procedures ?Procedures  ? ? ?Medications Ordered in ED ?Medications - No data to display ? ?ED Course/ Medical Decision Making/ A&P ?  ?                        ?Medical Decision Making ?Patient is a 76 year old woman who presents today for evaluation of chronic headache and chronic pain in her bilateral lower extremities.  She has been seen multiple times recently for the same complaint and does not endorse any significant changes. ?She did tell me that she is wearing multiple lidocaine patches.  We discussed safe use of lidocaine patches and she states her understanding. ?Recommended PCP follow-up.  She is neuro intact on my exam, and does not have any new acute complaints or concerns. ? ?Problems Addressed: ?Chronic nonintractable headache, unspecified headache type: chronic illness or injury ?Chronic pain of both lower extremities: chronic illness or injury ? ?Amount and/or Complexity of Data Reviewed ?External Data Reviewed: radiology and notes. ?   Details: Most recent CT scan of the head.  Multiple ED visits with similar complaints. ? ?Risk ?OTC drugs. ?Prescription drug management. ?Decision regarding hospitalization. ? ? ?Return precautions were discussed with patient who  states their understanding.  At the time of discharge patient denied any unaddressed complaints or concerns.  Patient is agreeable for discharge home. ? ?Note: Portions of this report may have been transcribed [place]

## 2021-05-17 NOTE — ED Triage Notes (Addendum)
Pt reports with headache and bilateral leg pain x 1 week.  ?

## 2021-05-18 ENCOUNTER — Emergency Department (HOSPITAL_COMMUNITY)
Admission: EM | Admit: 2021-05-18 | Discharge: 2021-05-18 | Disposition: A | Discharge status: Home / Self Care | Payer: Medicare Other | Attending: Emergency Medicine | Admitting: Emergency Medicine

## 2021-05-18 ENCOUNTER — Other Ambulatory Visit: Payer: Self-pay

## 2021-05-18 DIAGNOSIS — Z7982 Long term (current) use of aspirin: Secondary | ICD-10-CM | POA: Diagnosis not present

## 2021-05-18 DIAGNOSIS — R52 Pain, unspecified: Secondary | ICD-10-CM

## 2021-05-18 DIAGNOSIS — G8929 Other chronic pain: Secondary | ICD-10-CM | POA: Diagnosis not present

## 2021-05-18 DIAGNOSIS — R509 Fever, unspecified: Secondary | ICD-10-CM | POA: Insufficient documentation

## 2021-05-18 DIAGNOSIS — M79604 Pain in right leg: Secondary | ICD-10-CM

## 2021-05-18 MED ORDER — ACETAMINOPHEN 500 MG PO TABS
1000.0000 mg | ORAL_TABLET | Freq: Once | ORAL | Status: AC
  Administered 2021-05-18: 1000 mg via ORAL
  Filled 2021-05-18: qty 2

## 2021-05-18 MED ORDER — GABAPENTIN 100 MG PO CAPS
100.0000 mg | ORAL_CAPSULE | Freq: Once | ORAL | Status: AC
  Administered 2021-05-18: 100 mg via ORAL
  Filled 2021-05-18: qty 1

## 2021-05-18 MED ORDER — ACETAMINOPHEN 325 MG PO TABS
650.0000 mg | ORAL_TABLET | Freq: Once | ORAL | Status: AC
  Administered 2021-05-18: 650 mg via ORAL
  Filled 2021-05-18: qty 2

## 2021-05-18 NOTE — ED Provider Notes (Signed)
? [facility] EMERGENCY DEPARTMENT ?Provider Note ? ? ?CSN: [id number] ?Arrival date & time: 05/18/21  1426 ? ?  ? ?History ? ?Chief Complaint  ?Patient presents with  ? Headache  ? ? ?[facility] Jumbo[facility]ma [name] is a 76 y.o. female. ? ?HPI ?Presents with ongoing head, extremity pain.  Last evaluation was within the past 24 hours.  She notes that she continues to have pain in the typical distribution, head, left arm, left leg.  She has not seen her physician, not on pain medication since discharge.  No fall, no loss of sensation in hand, foot, face. ?  ? ?Home Medications ?Prior to Admission medications   ?Medication Sig Start Date End Date Taking? Authorizing Provider  ?acetaminophen (TYLENOL) 500 MG tablet Take 2 tablets (1,000 mg total) by mouth every 6 (six) hours as needed. 03/30/21   Arby BarrettePfeiffer, Marcy, MD  ?albuterol (VENTOLIN HFA) 108 (90 Base) MCG/ACT inhaler Inhale 1-2 puffs into the lungs every 6 (six) hours as needed for wheezing or shortness of breath. 04/22/20   Barbette MerinoKing, Crystal M, NP  ?aspirin EC 81 MG tablet Take 81 mg by mouth daily. Swallow whole.    [provider]  ?cephALEXin (KEFLEX) 500 MG capsule Take 1 capsule (500 mg total) by mouth 4 (four) times daily. 03/28/21   Ernie AvenaLawsing, James, MD  ?chlorthalidone (HYGROTON) 25 MG tablet Take 1 tablet (25 mg total) by mouth daily. 10/05/20 10/05/21  Barbette MerinoKing, Crystal M, NP  ?clopidogrel (PLAVIX) 75 MG tablet Take 1 tablet (75 mg total) by mouth daily. 04/22/20   Barbette MerinoKing, Crystal M, NP  ?diclofenac Sodium (VOLTAREN) 1 % GEL Apply 2 g topically 4 (four) times daily as needed. 03/23/21   Charlynne PanderYao, David Hsienta, MD  ?diphenhydramine-acetaminophen (TYLENOL PM) 25-500 MG TABS tablet Take 0.5 tablets by mouth at bedtime as needed. 1/2 tablet    [provider]  ?gabapentin (NEURONTIN) 100 MG capsule Take 1 capsule (100 mg total) by mouth 3 (three) times daily as needed for up to 30 doses. 05/02/21   Terald Sleeperrifan, Matthew J, MD  ?gabapentin (NEURONTIN) 300 MG capsule Take 1  capsule (300 mg total) by mouth 3 (three) times daily. ?Patient taking differently: Take 300 mg by mouth daily. 10/05/20   Barbette MerinoKing, Crystal M, NP  ?gatifloxacin (ZYMAXID) 0.5 % SOLN Place 1 drop into the right eye 4 (four) times daily. 06/28/20   [provider]  ?glipiZIDE (GLUCOTROL) 5 MG tablet Take 1 tablet (5 mg total) by mouth 2 (two) times daily before a meal. 04/22/20 04/22/21  Barbette MerinoKing, Crystal M, NP  ?hydrALAZINE (APRESOLINE) 50 MG tablet Take 1 tablet (50 mg total) by mouth 2 (two) times daily. ?Patient taking differently: Take 50 mg by mouth daily. 04/22/20   Barbette MerinoKing, Crystal M, NP  ?ketorolac (ACULAR) 0.5 % ophthalmic solution Place 1 drop into the right eye 4 (four) times daily. ?Patient not taking: Reported on 02/01/2021 06/28/20   [provider]  ?lidocaine (LIDODERM) 5 % Place 1 patch onto the skin daily. Remove & Discard patch within 12 hours or as directed by MD 05/06/21   Tilden Fossaees, Elizabeth, MD  ?lidocaine (LIDODERM) 5 % Place 1 patch onto the skin daily. Remove & Discard patch within 12 hours or as directed by MD 05/17/21   Horton, Mayer Maskerourtney F, MD  ?losartan (COZAAR) 50 MG tablet Take 1 tablet (50 mg total) by mouth daily. 10/05/20 10/05/21  Barbette MerinoKing, Crystal M, NP  ?metFORMIN (GLUCOPHAGE) 500 MG tablet Take 2 tablets (1,000 mg total) by mouth 2 (  two) times daily with a meal. ?Patient taking differently: Take 1,000 mg by mouth daily with breakfast. 10/05/20 10/05/21  Barbette Merino, NP  ?metoCLOPramide (REGLAN) 10 MG tablet Take 1 tablet (10 mg total) by mouth every 6 (six) hours as needed for nausea (nausea/headache). 03/23/21   [name], MD  ?mirabegron ER (MYRBETRIQ) 25 MG TB24 tablet Take 25 mg by mouth daily.    [provider]  ?naproxen (NAPROSYN) 250 MG tablet Take 1 tablet (250 mg total) by mouth 2 (two) times daily with a meal. 02/05/21   Linwood Dibbles, MD  ?[facility] VERIO test strip Use 1-4 times daily as directed/needed   DX E11.9 ?Patient not taking: No sig reported 04/25/20   Barbette Merino, NP  ?potassium chloride (KLOR-CON M) 20 MEQ tablet Take 0.5 tablets (10 mEq total) by mouth daily. 02/25/21   Dione Booze, MD  ?prednisoLONE acetate (PRED FORTE) 1 % ophthalmic suspension Place 1 drop into the right eye 4 (four) times daily. ?Patient not taking: Reported on 02/01/2021 06/28/20   [provider]  ?predniSONE (DELTASONE) 50 MG tablet Take 1 tablet (50 mg total) by mouth daily. 03/10/21   Dione Booze, MD  ?rosuvastatin (CRESTOR) 10 MG tablet Take 1 tablet (10 mg total) by mouth at bedtime. 10/05/20 10/05/21  Barbette Merino, NP  ?traMADol (ULTRAM) 50 MG tablet Take 1 tablet (50 mg total) by mouth every 6 (six) hours as needed. 1-2 tablets every 6 hours as needed for pain 03/30/21   Arby Barrette, MD  ?   ? ?Allergies    ?Codeine, Ibuprofen, and Imdur [isosorbide nitrate]   ? ?Review of Systems   ?Review of Systems  ?All other systems reviewed and are negative. ? ?Physical Exam ?Updated Vital Signs ?BP (!) 154/100   Pulse 74   Temp 98.6 ?F (37 ?C) (Oral)   Resp 16   SpO2 100%  ?Physical Exam ?Vitals and nursing note reviewed.  ?Constitutional:   ?   General: She is not in acute distress. ?   Appearance: She is well-developed.  ?HENT:  ?   Head: Normocephalic and atraumatic.  ?Eyes:  ?   Conjunctiva/sclera: Conjunctivae normal.  ?Cardiovascular:  ?   Rate and Rhythm: Normal rate and regular rhythm.  ?Pulmonary:  ?   Effort: Pulmonary effort is normal. No respiratory distress.  ?   Breath sounds: Normal breath sounds. No stridor.  ?Abdominal:  ?   General: There is no distension.  ?Skin: ?   General: Skin is warm and dry.  ?Neurological:  ?   Mental Status: She is alert and oriented to person, place, and time.  ?   Cranial Nerves: No cranial nerve deficit.  ?   Motor: Atrophy present. No weakness, tremor, abnormal muscle tone or pronator drift.  ?   Coordination: Coordination normal.  ?Psychiatric:     ?   Mood and Affect: Mood normal.  ?   Comments: Cantankerous mood  ? ? ?ED Results /  Procedures / Treatments   ?Labs ?(all labs ordered are listed, but only abnormal results are displayed) ?Labs Reviewed - No data to display ? ?EKG ?None ? ?Radiology ?No results found. ? ?Procedures ?Procedures  ? ? ?Medications Ordered in ED ?Medications  ?acetaminophen (TYLENOL) tablet 1,000 mg (has no administration in time range)  ?gabapentin (NEURONTIN) capsule 100 mg (has no administration in time range)  ?acetaminophen (TYLENOL) tablet 650 mg (650 mg Oral Given 05/18/21 1456)  ? ? ?ED Course/ Medical  Decision Making/ A&P ?This patient with a Hx of chronic pain presents to the ED for concern of pain head, arms, this involves an extensive number of treatment options, and is a complaint that carries with it a high risk of complications and morbidity.   ? ?The differential diagnosis includes acute on chronic pain, without reported fall, trauma, lower suspicion for acute new pathology, but this is a consideration ? ? ?Social Determinants of Health: ? ?Age, chronic pain ? ?Additional history obtained: ? ?Additional history and/or information obtained from chart review, notable for 43 prior ED visits in the past 6 months including twice yesterday.  CT head last performed 15 days ago unremarkable aside from cerebral atrophy.  Patient has had 5 CT scans of her head in the past 3 months. ? ? ?After the initial evaluation, orders, including: Tylenol, gabapentin were initiated. ? ? ? ?On repeat exam the patient continues to move all extremity spontaneously has no distress, no evidence for acute pathology.  Adult female with chronic pain presents with ongoing pain in her head, arms. ?Dispostion / Final MDM: ?5:19 PM ?Patient awake, alert, ambulatory, in no distress ?After consideration of the diagnostic results and the patient's response to treatment, discharged to follow-up with outpatient providers no evidence for acute new phenomena ? ?Final Clinical Impression(s) / ED Diagnoses ?Final diagnoses:  ?Pain  ? ?  ?Gerhard Munch, MD ?05/18/21 1719 ? ?

## 2021-05-18 NOTE — ED Triage Notes (Signed)
Pt reports on going HA and leg pain x over 1 week. Pt also reports having blurry vision.  ? ?HX: MI, Stroke,  ?

## 2021-05-18 NOTE — Discharge Instructions (Addendum)
Do not put more than one lidocaine patch on at the time. ? ?When you put a lidocaine patch on you can leave it on for 12 hours.  After that you must take it off and leave it off for a full 12 hours before putting a new patch on. ?

## 2021-05-18 NOTE — ED Provider Triage Note (Signed)
Emergency Medicine Provider Triage Evaluation Note ? ?[name] , a 76 y.o. female  was evaluated in triage.  Pt complains of headache, blurry vision, left leg pain.  Patient has been seen multiple times in the emergency department for these same complaints over the last few weeks.  She had an unremarkable head CT earlier this month, and lab work done at the end of March.  Patient reports that no significant change today.  She will not describe the quality or location of her headache, reporting "I already told you my head hurts".  She reports that she last took some Tylenol yesterday with no relief.  She denies any nausea, vomiting, chest pain, shortness of breath.  She does have a history of heart attack, stroke. ? ?Review of Systems  ?Positive: Headache, blurry vision, leg pain ?Negative: Chest pain, shob ? ?Physical Exam  ?BP (!) 154/100   Pulse 74   Temp 98.6 ?F (37 ?C) (Oral)   Resp 16   SpO2 100%  ?Gen:   Awake, no distress   ?Resp:  Normal effort  ?MSK:   Moves extremities without difficulty  ?Other:  Moves all 4 limbs spontaneously, facial muscles symmetric ? ?Medical Decision Making  ?Medically screening exam initiated at 2:51 PM.  Appropriate orders placed.  Jacklyn Branan was informed that the remainder of the evaluation will be completed by another provider, this initial triage assessment does not replace that evaluation, and the importance of remaining in the ED until their evaluation is complete. ? ?Patient provided tylenol in triage ?  ?Olene Floss, PA-C ?05/18/21 1453 ? ?

## 2021-05-18 NOTE — ED Notes (Signed)
Pt given bus pass ?

## 2021-05-18 NOTE — Discharge Instructions (Signed)
As discussed, your evaluation today has been largely reassuring.  But, it is important that you monitor your condition carefully, and do not hesitate to return to the ED if you develop new, or concerning changes in your condition. ? ?Otherwise, please follow-up with your physician for appropriate ongoing care. ? ?

## 2021-05-22 ENCOUNTER — Emergency Department (HOSPITAL_COMMUNITY)
Admission: EM | Admit: 2021-05-22 | Discharge: 2021-05-23 | Discharge status: Against Medical Advice | Payer: Medicare Other | Attending: Emergency Medicine | Admitting: Emergency Medicine

## 2021-05-22 DIAGNOSIS — R519 Headache, unspecified: Secondary | ICD-10-CM | POA: Insufficient documentation

## 2021-05-22 DIAGNOSIS — M79606 Pain in leg, unspecified: Secondary | ICD-10-CM | POA: Diagnosis not present

## 2021-05-22 DIAGNOSIS — Z5321 Procedure and treatment not carried out due to patient leaving prior to being seen by health care provider: Secondary | ICD-10-CM | POA: Insufficient documentation

## 2021-05-22 NOTE — ED Triage Notes (Signed)
Pt c/o continuing HA, leg pain. Ambulatory to triage, NAD.  ? ?Seen 44 times in the last 74mos. ?

## 2021-05-23 NOTE — ED Notes (Signed)
Pt called multiple times did not respond removed off the floor. Charge nurse aware ?

## 2021-05-25 ENCOUNTER — Encounter (HOSPITAL_COMMUNITY): Payer: Self-pay | Admitting: Emergency Medicine

## 2021-05-25 ENCOUNTER — Other Ambulatory Visit: Payer: Self-pay

## 2021-05-25 ENCOUNTER — Emergency Department (HOSPITAL_COMMUNITY)
Admission: EM | Admit: 2021-05-25 | Discharge: 2021-05-26 | Disposition: A | Discharge status: Home / Self Care | Payer: Medicare Other | Attending: Emergency Medicine | Admitting: Emergency Medicine

## 2021-05-25 DIAGNOSIS — G8929 Other chronic pain: Secondary | ICD-10-CM | POA: Diagnosis not present

## 2021-05-25 DIAGNOSIS — Z79899 Other long term (current) drug therapy: Secondary | ICD-10-CM | POA: Insufficient documentation

## 2021-05-25 DIAGNOSIS — M79669 Pain in unspecified lower leg: Secondary | ICD-10-CM | POA: Insufficient documentation

## 2021-05-25 DIAGNOSIS — R519 Headache, unspecified: Secondary | ICD-10-CM | POA: Insufficient documentation

## 2021-05-25 NOTE — ED Provider Triage Note (Signed)
Emergency Medicine Provider Triage Evaluation Note ? ?[name] , a 76 y.o. female  was evaluated in triage.  Pt complains of headache.  Is been going on for over a week, denies any lateralized weakness.  States she has pain in her lower extremities, patient is a challenging historian.. ? ?Review of Systems  ?Per HPI ? ?Physical Exam  ?BP (!) 163/62 (BP Location: Right Arm)   Pulse 68   Temp 98.2 ?F (36.8 ?C) (Oral)   Resp 16   SpO2 98%  ?Gen:   Awake, no distress   ?Resp:  Normal effort  ?MSK:   Moves extremities without difficulty.  Able to ambulate, ROM to knee.  She does have Ace bandage wrapped around her right shin ?Other:  Cranial nerves III through XII are grossly intact, peripheral fields intact.  No dysarthria, grip strength intact bilaterally ? ?Medical Decision Making  ?Medically screening exam initiated at 10:35 PM.  Appropriate orders placed.  [name] was informed that the remainder of the evaluation will be completed by another provider, this initial triage assessment does not replace that evaluation, and the importance of remaining in the ED until their evaluation is complete. ? ?Patient is well-known to the emergency department per chart review.  Last CT 05/03/2021.  Given no focal deficit on exam will obtain from ordering repeat imaging at this time. ?  ?Theron Arista, PA-C ?05/25/21 2237 ? ?

## 2021-05-26 ENCOUNTER — Telehealth: Payer: Self-pay

## 2021-05-26 DIAGNOSIS — R519 Headache, unspecified: Secondary | ICD-10-CM | POA: Diagnosis not present

## 2021-05-26 MED ORDER — OXYCODONE-ACETAMINOPHEN 5-325 MG PO TABS
1.0000 | ORAL_TABLET | Freq: Once | ORAL | Status: AC
  Administered 2021-05-26: 1 via ORAL
  Filled 2021-05-26: qty 1

## 2021-05-26 NOTE — Telephone Encounter (Signed)
RNCM received TOC consult for PCP needs. Patient had multiple ED visits with c/o headaches. RNCM called and unable to leave a voicemail message.  ?

## 2021-05-26 NOTE — ED Provider Notes (Signed)
?[facility] ?Provider Note ? ? ?CSN: [id number] ?Arrival date & time: 05/25/21  2053 ? ?  ? ?History ? ?Chief Complaint  ?Patient presents with  ? Headache  ? Leg Pain  ? ? ?[name] is a 76 y.o. female presenting to the ED with chronic joint pain and headache.  The patient has been seen 45 times in the emergency department in the past 6 months, several times a week often, complaining of both headaches and joint pain.  She has had multiple neuroimages including CT scan of her head 1 month ago which showed no acute finding to explain her headaches.  She was recently released from her PCPs office. ? ?HPI ? ?  ? ?Home Medications ?Prior to Admission medications   ?Medication Sig Start Date End Date Taking? Authorizing Provider  ?acetaminophen (TYLENOL) 500 MG tablet Take 2 tablets (1,000 mg total) by mouth every 6 (six) hours as needed. 03/30/21   Charlesetta Shanks, MD  ?albuterol (VENTOLIN HFA) 108 (90 Base) MCG/ACT inhaler Inhale 1-2 puffs into the lungs every 6 (six) hours as needed for wheezing or shortness of breath. 04/22/20   Vevelyn Francois, NP  ?aspirin EC 81 MG tablet Take 81 mg by mouth daily. Swallow whole.    [provider]  ?cephALEXin (KEFLEX) 500 MG capsule Take 1 capsule (500 mg total) by mouth 4 (four) times daily. 03/28/21   Regan Lemming, MD  ?chlorthalidone (HYGROTON) 25 MG tablet Take 1 tablet (25 mg total) by mouth daily. 10/05/20 10/05/21  Vevelyn Francois, NP  ?clopidogrel (PLAVIX) 75 MG tablet Take 1 tablet (75 mg total) by mouth daily. 04/22/20   Vevelyn Francois, NP  ?diclofenac Sodium (VOLTAREN) 1 % GEL Apply 2 g topically 4 (four) times daily as needed. 03/23/21   Drenda Freeze, MD  ?diphenhydramine-acetaminophen (TYLENOL PM) 25-500 MG TABS tablet Take 0.5 tablets by mouth at bedtime as needed. 1/2 tablet    [provider]  ?gabapentin (NEURONTIN) 100 MG capsule Take 1 capsule (100 mg total) by mouth 3 (three) times daily as needed for up  to 30 doses. 05/02/21   Wyvonnia Dusky, MD  ?gabapentin (NEURONTIN) 300 MG capsule Take 1 capsule (300 mg total) by mouth 3 (three) times daily. ?Patient taking differently: Take 300 mg by mouth daily. 10/05/20   Vevelyn Francois, NP  ?gatifloxacin (ZYMAXID) 0.5 % SOLN Place 1 drop into the right eye 4 (four) times daily. 06/28/20   [provider]  ?glipiZIDE (GLUCOTROL) 5 MG tablet Take 1 tablet (5 mg total) by mouth 2 (two) times daily before a meal. 04/22/20 04/22/21  Vevelyn Francois, NP  ?hydrALAZINE (APRESOLINE) 50 MG tablet Take 1 tablet (50 mg total) by mouth 2 (two) times daily. ?Patient taking differently: Take 50 mg by mouth daily. 04/22/20   Vevelyn Francois, NP  ?ketorolac (ACULAR) 0.5 % ophthalmic solution Place 1 drop into the right eye 4 (four) times daily. ?Patient not taking: Reported on 02/01/2021 06/28/20   [provider]  ?lidocaine (LIDODERM) 5 % Place 1 patch onto the skin daily. Remove & Discard patch within 12 hours or as directed by MD 05/06/21   Quintella Reichert, MD  ?lidocaine (LIDODERM) 5 % Place 1 patch onto the skin daily. Remove & Discard patch within 12 hours or as directed by MD 05/17/21   Horton, Barbette Hair, MD  ?losartan (COZAAR) 50 MG tablet Take 1 tablet (50 mg total) by mouth daily. 10/05/20 10/05/21  Vevelyn Francois, NP  ?metFORMIN (GLUCOPHAGE) 500 MG tablet Take 2 tablets (1,000 mg total) by mouth 2 (two) times daily with a meal. ?Patient taking differently: Take 1,000 mg by mouth daily with breakfast. 10/05/20 10/05/21  Vevelyn Francois, NP  ?metoCLOPramide (REGLAN) 10 MG tablet Take 1 tablet (10 mg total) by mouth every 6 (six) hours as needed for nausea (nausea/headache). 03/23/21   Drenda Freeze, MD  ?mirabegron ER (MYRBETRIQ) 25 MG TB24 tablet Take 25 mg by mouth daily.    [provider]  ?naproxen (NAPROSYN) 250 MG tablet Take 1 tablet (250 mg total) by mouth 2 (two) times daily with a meal. 02/05/21   Dorie Rank, MD  ?[facility] VERIO test strip Use 1-4 times  daily as directed/needed   DX E11.9 ?Patient not taking: No sig reported 04/25/20   Vevelyn Francois, NP  ?potassium chloride (KLOR-CON M) 20 MEQ tablet Take 0.5 tablets (10 mEq total) by mouth daily. [id number]   Delora Fuel, MD  ?prednisoLONE acetate (PRED FORTE) 1 % ophthalmic suspension Place 1 drop into the right eye 4 (four) times daily. ?Patient not taking: Reported on 02/01/2021 06/28/20   [provider]  ?predniSONE (DELTASONE) 50 MG tablet Take 1 tablet (50 mg total) by mouth daily. [id number]   Delora Fuel, MD  ?rosuvastatin (CRESTOR) 10 MG tablet Take 1 tablet (10 mg total) by mouth at bedtime. 10/05/20 10/05/21  Vevelyn Francois, NP  ?traMADol (ULTRAM) 50 MG tablet Take 1 tablet (50 mg total) by mouth every 6 (six) hours as needed. 1-2 tablets every 6 hours as needed for pain 03/30/21   Charlesetta Shanks, MD  ?   ? ?Allergies    ?Codeine, Ibuprofen, and Imdur [isosorbide nitrate]   ? ?Review of Systems   ?Review of Systems ? ?Physical Exam ?Updated Vital Signs ?BP (!) 127/59 (BP Location: Left Arm)   Pulse (!) 57   Temp 98.6 ?F (37 ?C) (Oral)   Resp 16   SpO2 98%  ?Physical Exam ?Constitutional:   ?   General: She is not in acute distress. ?HENT:  ?   Head: Normocephalic and atraumatic.  ?Eyes:  ?   Conjunctiva/sclera: Conjunctivae normal.  ?   Pupils: Pupils are equal, round, and reactive to light.  ?Cardiovascular:  ?   Rate and Rhythm: Normal rate and regular rhythm.  ?Pulmonary:  ?   Effort: Pulmonary effort is normal. No respiratory distress.  ?Abdominal:  ?   General: There is no distension.  ?   Tenderness: There is no abdominal tenderness.  ?Skin: ?   General: Skin is warm and dry.  ?Neurological:  ?   General: No focal deficit present.  ?   Mental Status: She is alert. Mental status is at baseline.  ?Psychiatric:     ?   Mood and Affect: Mood normal.     ?   Behavior: Behavior normal.  ? ? ?ED Results / Procedures / Treatments   ?Labs ?(all labs ordered are listed, but only abnormal results are  displayed) ?Labs Reviewed - No data to display ? ?EKG ?None ? ?Radiology ?No results found. ? ?Procedures ?Procedures  ? ? ?Medications Ordered in ED ?Medications  ?oxyCODONE-acetaminophen (PERCOCET/ROXICET) 5-325 MG per tablet 1 tablet (has no administration in time range)  ? ? ?ED Course/ Medical Decision Making/ A&P ?  ?                        ?Medical  Decision Making ?Risk ?Prescription drug management. ? ? ?Patient is here with chronic pain.  She is also asking for some food.  She has been approximately 12 hours in the ER overnight. I have consulted our SW to assist with new placement for PCP office, to see if we can get her better help to manage her chronic pain.  I do not believe she needs emergent imaging at this time.  I doubt intracranial hemorrhage, injury, meningitis, or other life-threatening emergency.  I doubt she has an acute fracture.  She is ambulatory on her feet.  We can provide some food here. ? ? ? ? ? ? ? ?Final Clinical Impression(s) / ED Diagnoses ?Final diagnoses:  ?Other chronic pain  ?Chronic nonintractable headache, unspecified headache type  ? ? ?Rx / DC Orders ?ED Discharge Orders   ? ? None  ? ?  ? ? ?  ?Wyvonnia Dusky, MD ?05/26/21 0845 ? ?

## 2021-05-30 ENCOUNTER — Encounter (HOSPITAL_COMMUNITY): Payer: Self-pay

## 2021-05-30 ENCOUNTER — Emergency Department (HOSPITAL_COMMUNITY)
Admission: EM | Admit: 2021-05-30 | Discharge: 2021-05-31 | Disposition: A | Discharge status: Home / Self Care | Payer: Medicare Other | Attending: Emergency Medicine | Admitting: Emergency Medicine

## 2021-05-30 ENCOUNTER — Other Ambulatory Visit: Payer: Self-pay

## 2021-05-30 DIAGNOSIS — Z79899 Other long term (current) drug therapy: Secondary | ICD-10-CM | POA: Insufficient documentation

## 2021-05-30 DIAGNOSIS — Z7984 Long term (current) use of oral hypoglycemic drugs: Secondary | ICD-10-CM | POA: Insufficient documentation

## 2021-05-30 DIAGNOSIS — Z7982 Long term (current) use of aspirin: Secondary | ICD-10-CM | POA: Diagnosis not present

## 2021-05-30 DIAGNOSIS — Z7902 Long term (current) use of antithrombotics/antiplatelets: Secondary | ICD-10-CM | POA: Diagnosis not present

## 2021-05-30 DIAGNOSIS — Z8673 Personal history of transient ischemic attack (TIA), and cerebral infarction without residual deficits: Secondary | ICD-10-CM | POA: Insufficient documentation

## 2021-05-30 DIAGNOSIS — M79604 Pain in right leg: Secondary | ICD-10-CM | POA: Diagnosis not present

## 2021-05-30 DIAGNOSIS — G8929 Other chronic pain: Secondary | ICD-10-CM | POA: Insufficient documentation

## 2021-05-30 DIAGNOSIS — M542 Cervicalgia: Secondary | ICD-10-CM | POA: Insufficient documentation

## 2021-05-30 DIAGNOSIS — R519 Headache, unspecified: Secondary | ICD-10-CM | POA: Insufficient documentation

## 2021-05-30 DIAGNOSIS — M79605 Pain in left leg: Secondary | ICD-10-CM | POA: Insufficient documentation

## 2021-05-30 MED ORDER — ACETAMINOPHEN 325 MG PO TABS
650.0000 mg | ORAL_TABLET | Freq: Once | ORAL | Status: AC
  Administered 2021-05-31: 650 mg via ORAL
  Filled 2021-05-30: qty 2

## 2021-05-30 NOTE — ED Triage Notes (Signed)
Pt here c/o leg pain.No other complaints ?

## 2021-05-30 NOTE — ED Provider Triage Note (Signed)
Emergency Medicine Provider Triage Evaluation Note ? ?[name] , a 76 y.o. female  was evaluated in triage.  Pt complains of headache and lower leg pain. She has been seen in the ER 45 times in the past 6 months, majority for the same complaints. She has had workup for the these problems before and doubts any worsening pain.  ? ?Review of Systems  ?Positive:  ?Negative:  ? ?Physical Exam  ?BP (!) 170/59 (BP Location: Left Arm)   Pulse 63   Temp 98.3 ?F (36.8 ?C) (Oral)   Resp 18   SpO2 98%  ?Gen:   Awake, no distress   ?Resp:  Normal effort  ?MSK:   Moves extremities without difficulty  ?Other:  Cranial nerves II-XII intact ? ?Medical Decision Making  ?Medically screening exam initiated at 11:32 PM.  Appropriate orders placed.  Kaelie Henigan was informed that the remainder of the evaluation will be completed by another provider, this initial triage assessment does not replace that evaluation, and the importance of remaining in the ED until their evaluation is complete. ? ?Will order Tylenol for pain.  ?  ?Achille Rich, PA-C ?05/30/21 2335 ? ?

## 2021-05-31 ENCOUNTER — Encounter (HOSPITAL_COMMUNITY): Payer: Self-pay

## 2021-05-31 ENCOUNTER — Emergency Department (HOSPITAL_COMMUNITY)
Admission: EM | Admit: 2021-05-31 | Discharge: 2021-05-31 | Disposition: A | Discharge status: Home / Self Care | Payer: Medicare Other | Source: Home / Self Care | Attending: Emergency Medicine | Admitting: Emergency Medicine

## 2021-05-31 DIAGNOSIS — R519 Headache, unspecified: Secondary | ICD-10-CM | POA: Insufficient documentation

## 2021-05-31 DIAGNOSIS — Z7982 Long term (current) use of aspirin: Secondary | ICD-10-CM | POA: Insufficient documentation

## 2021-05-31 DIAGNOSIS — M542 Cervicalgia: Secondary | ICD-10-CM | POA: Insufficient documentation

## 2021-05-31 DIAGNOSIS — Z7984 Long term (current) use of oral hypoglycemic drugs: Secondary | ICD-10-CM | POA: Insufficient documentation

## 2021-05-31 DIAGNOSIS — G8929 Other chronic pain: Secondary | ICD-10-CM

## 2021-05-31 DIAGNOSIS — Z7902 Long term (current) use of antithrombotics/antiplatelets: Secondary | ICD-10-CM | POA: Insufficient documentation

## 2021-05-31 MED ORDER — HYDRALAZINE HCL 25 MG PO TABS
25.0000 mg | ORAL_TABLET | Freq: Once | ORAL | Status: AC
  Administered 2021-05-31: 25 mg via ORAL
  Filled 2021-05-31: qty 1

## 2021-05-31 NOTE — ED Notes (Signed)
Pt denied pain, rested in room in NAD.  Upon d/c pt became agitated c/o 10/10 pain to her feet. Pt strongly encouraged to obtain PCP d/t chronic medical conditions. Pt refused d/c vital signs stating, "Y'all didn't do nothing you never help me." Once again PCP highly encouraged however pt remained unreceptive. Pt ambulatory to lobby, shuttle service to transport pt to bus stop.  ?

## 2021-05-31 NOTE — ED Triage Notes (Signed)
Pt states that she has had a headache and neck pain x 1 week.  ?

## 2021-05-31 NOTE — ED Provider Notes (Signed)
?[facility] EMERGENCY DEPARTMENT ?Provider Note ? ? ?CSN: [id number] ?Arrival date & time: 05/30/21  2054 ? ?  ? ?History ?Chief Complaint  ?Patient presents with  ? Leg Pain  ? ? ?[name] is a 76 y.o. female with history of chronic headache, CVA, chronic lower joint pain presents the emergency department for her chronic issues.  She was recently seen on 05-25-2021 for the same complaints.  She denies any worsening or improving of the issues.  She does not have a primary care provider and reports she "does not want to mess with that".  She denies any blurry vision or any numbness or tingling.  She denies any problems with her gait.  She denies any new trauma.  Does not take any medication at home for pain. ? ? ?Leg Pain ? ?  ? ?Home Medications ?Prior to Admission medications   ?Medication Sig Start Date End Date Taking? Authorizing Provider  ?acetaminophen (TYLENOL) 500 MG tablet Take 2 tablets (1,000 mg total) by mouth every 6 (six) hours as needed. 03/30/21   Arby Barrette, MD  ?albuterol (VENTOLIN HFA) 108 (90 Base) MCG/ACT inhaler Inhale 1-2 puffs into the lungs every 6 (six) hours as needed for wheezing or shortness of breath. 04/22/20   Barbette Merino, NP  ?aspirin EC 81 MG tablet Take 81 mg by mouth daily. Swallow whole.    [provider]  ?cephALEXin (KEFLEX) 500 MG capsule Take 1 capsule (500 mg total) by mouth 4 (four) times daily. 03/28/21   Ernie Avena, MD  ?chlorthalidone (HYGROTON) 25 MG tablet Take 1 tablet (25 mg total) by mouth daily. 10/05/20 10/05/21  Barbette Merino, NP  ?clopidogrel (PLAVIX) 75 MG tablet Take 1 tablet (75 mg total) by mouth daily. 04/22/20   Barbette Merino, NP  ?diclofenac Sodium (VOLTAREN) 1 % GEL Apply 2 g topically 4 (four) times daily as needed. 03/23/21   [name], MD  ?diphenhydramine-acetaminophen (TYLENOL PM) 25-500 MG TABS tablet Take 0.5 tablets by mouth at bedtime as needed. 1/2 tablet    [provider]  ?gabapentin  (NEURONTIN) 100 MG capsule Take 1 capsule (100 mg total) by mouth 3 (three) times daily as needed for up to 30 doses. 05/02/21   Terald Sleeper, MD  ?gabapentin (NEURONTIN) 300 MG capsule Take 1 capsule (300 mg total) by mouth 3 (three) times daily. ?Patient taking differently: Take 300 mg by mouth daily. 10/05/20   Barbette Merino, NP  ?gatifloxacin (ZYMAXID) 0.5 % SOLN Place 1 drop into the right eye 4 (four) times daily. 06/28/20   [provider]  ?glipiZIDE (GLUCOTROL) 5 MG tablet Take 1 tablet (5 mg total) by mouth 2 (two) times daily before a meal. 04/22/20 04/22/21  Barbette Merino, NP  ?hydrALAZINE (APRESOLINE) 50 MG tablet Take 1 tablet (50 mg total) by mouth 2 (two) times daily. ?Patient taking differently: Take 50 mg by mouth daily. 04/22/20   Barbette Merino, NP  ?ketorolac (ACULAR) 0.5 % ophthalmic solution Place 1 drop into the right eye 4 (four) times daily. ?Patient not taking: Reported on 02/01/2021 06/28/20   [provider]  ?lidocaine (LIDODERM) 5 % Place 1 patch onto the skin daily. Remove & Discard patch within 12 hours or as directed by MD 05/06/21   Tilden Fossa, MD  ?lidocaine (LIDODERM) 5 % Place 1 patch onto the skin daily. Remove & Discard patch within 12 hours or as directed by MD 05/17/21   Ross Marcus  F, MD  ?losartan (COZAAR) 50 MG tablet Take 1 tablet (50 mg total) by mouth daily. 10/05/20 10/05/21  Barbette Merino, NP  ?metFORMIN (GLUCOPHAGE) 500 MG tablet Take 2 tablets (1,000 mg total) by mouth 2 (two) times daily with a meal. ?Patient taking differently: Take 1,000 mg by mouth daily with breakfast. 10/05/20 10/05/21  Barbette Merino, NP  ?metoCLOPramide (REGLAN) 10 MG tablet Take 1 tablet (10 mg total) by mouth every 6 (six) hours as needed for nausea (nausea/headache). 03/23/21   [name], MD  ?mirabegron ER (MYRBETRIQ) 25 MG TB24 tablet Take 25 mg by mouth daily.    [provider]  ?naproxen (NAPROSYN) 250 MG tablet Take 1 tablet (250 mg total) by  mouth 2 (two) times daily with a meal. 02/05/21   Linwood Dibbles, MD  ?[facility] VERIO test strip Use 1-4 times daily as directed/needed   DX E11.9 ?Patient not taking: No sig reported 04/25/20   Barbette Merino, NP  ?potassium chloride (KLOR-CON M) 20 MEQ tablet Take 0.5 tablets (10 mEq total) by mouth daily. 02/25/21   Dione Booze, MD  ?prednisoLONE acetate (PRED FORTE) 1 % ophthalmic suspension Place 1 drop into the right eye 4 (four) times daily. ?Patient not taking: Reported on 02/01/2021 06/28/20   [provider]  ?predniSONE (DELTASONE) 50 MG tablet Take 1 tablet (50 mg total) by mouth daily. 03/10/21   Dione Booze, MD  ?rosuvastatin (CRESTOR) 10 MG tablet Take 1 tablet (10 mg total) by mouth at bedtime. 10/05/20 10/05/21  Barbette Merino, NP  ?traMADol (ULTRAM) 50 MG tablet Take 1 tablet (50 mg total) by mouth every 6 (six) hours as needed. 1-2 tablets every 6 hours as needed for pain 03/30/21   Arby Barrette, MD  ?   ? ?Allergies    ?Codeine, Ibuprofen, and Imdur [isosorbide nitrate]   ? ?Review of Systems   ?Review of Systems  ?Musculoskeletal:  Positive for arthralgias and myalgias.  ?Neurological:  Positive for headaches.  ? ?Physical Exam ?Updated Vital Signs ?BP (!) 170/59 (BP Location: Left Arm)   Pulse 63   Temp 98.3 ?F (36.8 ?C) (Oral)   Resp 18   SpO2 98%  ?Physical Exam ?Vitals and nursing note reviewed.  ?Constitutional:   ?   General: She is not in acute distress. ?   Appearance: She is not ill-appearing or toxic-appearing.  ?HENT:  ?   Head: Normocephalic and atraumatic.  ?Eyes:  ?   Extraocular Movements: Extraocular movements intact.  ?   Pupils: Pupils are equal, round, and reactive to light.  ?Cardiovascular:  ?   Rate and Rhythm: Normal rate.  ?   Pulses: Normal pulses.  ?   Comments: Radial, DP, and PT pulses intact bilaterally. ?Pulmonary:  ?   Effort: Pulmonary effort is normal. No respiratory distress.  ?Musculoskeletal:  ?   Cervical back: Normal range of motion. No tenderness.  ?    Right lower leg: No edema.  ?   Left lower leg: No edema.  ?   Comments: Compartments soft  ?Neurological:  ?   General: No focal deficit present.  ?   Mental Status: She is alert. Mental status is at baseline.  ?   Cranial Nerves: No cranial nerve deficit.  ?   Gait: Gait normal.  ?   Comments: Ambulatory without assistance  ? ? ?ED Results / Procedures / Treatments   ?Labs ?(all labs ordered are listed, but only abnormal results are displayed) ?Labs  Reviewed - No data to display ? ?EKG ?None ? ?Radiology ?No results found. ? ?Procedures ?Procedures  ? ?Medications Ordered in ED ?Medications  ?acetaminophen (TYLENOL) tablet 650 mg (has no administration in time range)  ? ? ?ED Course/ Medical Decision Making/ A&P ?  ?                        ?Medical Decision Making ?Risk ?OTC drugs. ? ? ?76 year old female presents emergency department for evaluation of her chronic headache and lower limb pain.  Patient's been seen 45 times in the past 6 months for similar issues, last visit being on 05-25-2021.  Last CT of her head was on 05-03-2021.  She has had 1 on 04-11-2021 and as well as 04-04-2021.  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  She is complaining about this pain since falling on the bus months ago.  The pain is chronic for her and she denies any worsening or improvement of the pain.  At this time, I doubt any need for any imaging as I doubt any fracture or subarachnoid bleed or intracranial process as this is her typical headache presentation and she denies any new trauma to the areas.  She has a benign neurological exam and is ambulatory on her feet. ? ?Tylenol given for pain as patient is taking the bus home.  We discussed her need to following up with primary care office for her chronic pain issues.  She reports that she does not want a mess with her primary care provider.  I instructed her that she will need to follow-up  with them for their chronic pain issues.  We discussed strict return precautions.  [facility]  Patient is being discharged home with a good condition

## 2021-05-31 NOTE — ED Provider Notes (Signed)
?[facility]-EMERGENCY DEPT ?Provider Note ? ? ?CSN: [id number] ?Arrival date & time: 05/31/21  2563 ? ?  ? ?History ? ?Chief Complaint  ?Patient presents with  ? Headache  ? Neck Pain  ? ? ?[name] is a 76 y.o. female. ? ?Patient complains of pain in her head and pain of her neck patient states she fell a month ago while getting off the bus and hit her head and injured her neck.  Patient reports she was seen at [facility] and came here because she did not feel like anything that was done to help her.  Patient states she wants somebody to look at her neck and tell her what is wrong with it. ? ?The history is provided by the patient. No language interpreter was used.  ?Headache ?Pain location:  Generalized ?Radiates to:  L neck and R neck ?Severity currently:  Unable to specify ?Severity at highest:  Unable to specify ?Timing:  Constant ?Progression:  Worsening ?Chronicity:  New ?Context: not activity   ?Relieved by:  Nothing ?Worsened by:  Nothing ?Ineffective treatments:  None tried ?Associated symptoms: neck pain   ?Neck Pain ?Associated symptoms: headaches   ? ?  ? ?Home Medications ?Prior to Admission medications   ?Medication Sig Start Date End Date Taking? Authorizing Provider  ?acetaminophen (TYLENOL) 500 MG tablet Take 2 tablets (1,000 mg total) by mouth every 6 (six) hours as needed. 03/30/21   Arby Barrette, MD  ?albuterol (VENTOLIN HFA) 108 (90 Base) MCG/ACT inhaler Inhale 1-2 puffs into the lungs every 6 (six) hours as needed for wheezing or shortness of breath. 04/22/20   Barbette Merino, NP  ?aspirin EC 81 MG tablet Take 81 mg by mouth daily. Swallow whole.    [provider]  ?cephALEXin (KEFLEX) 500 MG capsule Take 1 capsule (500 mg total) by mouth 4 (four) times daily. 03/28/21   Ernie Avena, MD  ?chlorthalidone (HYGROTON) 25 MG tablet Take 1 tablet (25 mg total) by mouth daily. 10/05/20 10/05/21  Barbette Merino, NP  ?clopidogrel (PLAVIX) 75 MG tablet Take 1 tablet (75  mg total) by mouth daily. 04/22/20   Barbette Merino, NP  ?diclofenac Sodium (VOLTAREN) 1 % GEL Apply 2 g topically 4 (four) times daily as needed. 03/23/21   [name], MD  ?diphenhydramine-acetaminophen (TYLENOL PM) 25-500 MG TABS tablet Take 0.5 tablets by mouth at bedtime as needed. 1/2 tablet    [provider]  ?gabapentin (NEURONTIN) 100 MG capsule Take 1 capsule (100 mg total) by mouth 3 (three) times daily as needed for up to 30 doses. 05/02/21   Terald Sleeper, MD  ?gabapentin (NEURONTIN) 300 MG capsule Take 1 capsule (300 mg total) by mouth 3 (three) times daily. ?Patient taking differently: Take 300 mg by mouth daily. 10/05/20   Barbette Merino, NP  ?gatifloxacin (ZYMAXID) 0.5 % SOLN Place 1 drop into the right eye 4 (four) times daily. 06/28/20   [provider]  ?glipiZIDE (GLUCOTROL) 5 MG tablet Take 1 tablet (5 mg total) by mouth 2 (two) times daily before a meal. 04/22/20 04/22/21  Barbette Merino, NP  ?hydrALAZINE (APRESOLINE) 50 MG tablet Take 1 tablet (50 mg total) by mouth 2 (two) times daily. ?Patient taking differently: Take 50 mg by mouth daily. 04/22/20   Barbette Merino, NP  ?ketorolac (ACULAR) 0.5 % ophthalmic solution Place 1 drop into the right eye 4 (four) times daily. ?Patient not taking: Reported on 02/01/2021 06/28/20  [provider]  ?lidocaine (LIDODERM) 5 % Place 1 patch onto the skin daily. Remove & Discard patch within 12 hours or as directed by MD 05/06/21   Tilden Fossaees, Elizabeth, MD  ?lidocaine (LIDODERM) 5 % Place 1 patch onto the skin daily. Remove & Discard patch within 12 hours or as directed by MD 05/17/21   Horton, Mayer Maskerourtney F, MD  ?losartan (COZAAR) 50 MG tablet Take 1 tablet (50 mg total) by mouth daily. 10/05/20 10/05/21  Barbette MerinoKing, Crystal M, NP  ?metFORMIN (GLUCOPHAGE) 500 MG tablet Take 2 tablets (1,000 mg total) by mouth 2 (two) times daily with a meal. ?Patient taking differently: Take 1,000 mg by mouth daily with breakfast. 10/05/20 10/05/21  Barbette MerinoKing, Crystal  M, NP  ?metoCLOPramide (REGLAN) 10 MG tablet Take 1 tablet (10 mg total) by mouth every 6 (six) hours as needed for nausea (nausea/headache). 03/23/21   Charlynne PanderYao, David Hsienta, MD  ?mirabegron ER (MYRBETRIQ) 25 MG TB24 tablet Take 25 mg by mouth daily.    [provider]  ?naproxen (NAPROSYN) 250 MG tablet Take 1 tablet (250 mg total) by mouth 2 (two) times daily with a meal. 02/05/21   Linwood DibblesKnapp, Jon, MD  ?[facility] test strip Use 1-4 times daily as directed/needed   DX E11.9 ?Patient not taking: No sig reported 04/25/20   Barbette MerinoKing, Crystal M, NP  ?potassium chloride (KLOR-CON M) 20 MEQ tablet Take 0.5 tablets (10 mEq total) by mouth daily. 02/25/21   Dione BoozeGlick, David, MD  ?prednisoLONE acetate (PRED FORTE) 1 % ophthalmic suspension Place 1 drop into the right eye 4 (four) times daily. ?Patient not taking: Reported on 02/01/2021 06/28/20   [provider]  ?predniSONE (DELTASONE) 50 MG tablet Take 1 tablet (50 mg total) by mouth daily. 03/10/21   Dione BoozeGlick, David, MD  ?rosuvastatin (CRESTOR) 10 MG tablet Take 1 tablet (10 mg total) by mouth at bedtime. 10/05/20 10/05/21  Barbette MerinoKing, Crystal M, NP  ?traMADol (ULTRAM) 50 MG tablet Take 1 tablet (50 mg total) by mouth every 6 (six) hours as needed. 1-2 tablets every 6 hours as needed for pain 03/30/21   Arby BarrettePfeiffer, Marcy, MD  ?   ? ?Allergies    ?Codeine, Ibuprofen, and Imdur [isosorbide nitrate]   ? ?Review of Systems   ?Review of Systems  ?Musculoskeletal:  Positive for neck pain.  ?Neurological:  Positive for headaches.  ?All other systems reviewed and are negative. ? ?Physical Exam ?Updated Vital Signs ?BP (!) 190/70 (BP Location: Left Arm)   Pulse 71   Temp 97.9 ?F (36.6 ?C) (Oral)   Resp 18   SpO2 99%  ?Physical Exam ?Vitals and nursing note reviewed.  ?Constitutional:   ?   Appearance: She is well-developed.  ?HENT:  ?   Head: Normocephalic.  ?Eyes:  ?   Extraocular Movements: Extraocular movements intact.  ?Cardiovascular:  ?   Rate and Rhythm: Normal rate.  ?   Heart sounds:  Normal heart sounds.  ?Pulmonary:  ?   Effort: Pulmonary effort is normal.  ?Musculoskeletal:  ?   Cervical back: Normal range of motion and neck supple.  ?Skin: ?   General: Skin is warm.  ?Neurological:  ?   Mental Status: She is alert.  ?Psychiatric:     ?   Mood and Affect: Mood normal.  ? ? ?ED Results / Procedures / Treatments   ?Labs ?(all labs ordered are listed, but only abnormal results are displayed) ?Labs Reviewed - No data to display ? ?EKG ?None ? ?Radiology ?No  results found. ? ?Procedures ?Procedures  ? ? ?Medications Ordered in ED ?Medications  ?hydrALAZINE (APRESOLINE) tablet 25 mg (25 mg Oral Given 05/31/21 0815)  ? ? ?ED Course/ Medical Decision Making/ A&P ?  ?                        ?Medical Decision Making ?Risk ?Prescription drug management. ? ? ?MDM patient refuses to put on a gown she states she is not taking her clothes off to be examined.  Unable to visualize patient's neck and back by pulling her multiple layers of close up I do not see any deformity do not see any rashes.  Patient's records are reviewed patient has had imaging previously of all areas of pain.  I gave patient hydralazine here for her blood pressure and a dose of tramadol.  I advised patient she does need to follow-up with primary care.  Patient informs me that she prefers to come to the emergency department. ? ? ? ? ? ? ? ?Final Clinical Impression(s) / ED Diagnoses ?Final diagnoses:  ?Other chronic pain  ? ? ?Rx / DC Orders ?ED Discharge Orders   ? ? None  ? ?  ? ?An After Visit Summary was printed and given to the patient. ? ?  ?Elson Areas, [place] ?05/31/21 3846 ? ?  ?Sloan Leiter, DO ?05/31/21 1936 ? ?

## 2021-05-31 NOTE — Discharge Instructions (Signed)
Return if any problems.

## 2021-05-31 NOTE — Discharge Instructions (Signed)
I have attached information for a primary care doctor to this discharge paperwork.  Please make sure to call them to schedule an appointment to establish care for your chronic pain.  If you have any worsening headache or worsening leg pain, please return to the nearest emergency department for evaluation. ?

## 2021-06-15 ENCOUNTER — Ambulatory Visit (INDEPENDENT_AMBULATORY_CARE_PROVIDER_SITE_OTHER): Payer: Medicare Other | Admitting: Internal Medicine

## 2021-06-15 ENCOUNTER — Encounter: Payer: Self-pay | Admitting: Internal Medicine

## 2021-06-15 VITALS — BP 147/68 | HR 80 | Wt 125.0 lb

## 2021-06-15 DIAGNOSIS — Z7984 Long term (current) use of oral hypoglycemic drugs: Secondary | ICD-10-CM

## 2021-06-15 DIAGNOSIS — Z1211 Encounter for screening for malignant neoplasm of colon: Secondary | ICD-10-CM

## 2021-06-15 DIAGNOSIS — E1142 Type 2 diabetes mellitus with diabetic polyneuropathy: Secondary | ICD-10-CM

## 2021-06-15 DIAGNOSIS — Z1231 Encounter for screening mammogram for malignant neoplasm of breast: Secondary | ICD-10-CM

## 2021-06-15 DIAGNOSIS — E1169 Type 2 diabetes mellitus with other specified complication: Secondary | ICD-10-CM | POA: Diagnosis not present

## 2021-06-15 DIAGNOSIS — E0800 Diabetes mellitus due to underlying condition with hyperosmolarity without nonketotic hyperglycemic-hyperosmolar coma (NKHHC): Secondary | ICD-10-CM

## 2021-06-15 DIAGNOSIS — Z8673 Personal history of transient ischemic attack (TIA), and cerebral infarction without residual deficits: Secondary | ICD-10-CM

## 2021-06-15 DIAGNOSIS — I10 Essential (primary) hypertension: Secondary | ICD-10-CM

## 2021-06-15 DIAGNOSIS — E0842 Diabetes mellitus due to underlying condition with diabetic polyneuropathy: Secondary | ICD-10-CM

## 2021-06-15 DIAGNOSIS — Z7689 Persons encountering health services in other specified circumstances: Secondary | ICD-10-CM

## 2021-06-15 DIAGNOSIS — R4189 Other symptoms and signs involving cognitive functions and awareness: Secondary | ICD-10-CM

## 2021-06-15 DIAGNOSIS — R519 Headache, unspecified: Secondary | ICD-10-CM

## 2021-06-15 DIAGNOSIS — E785 Hyperlipidemia, unspecified: Secondary | ICD-10-CM

## 2021-06-15 DIAGNOSIS — Z0000 Encounter for general adult medical examination without abnormal findings: Secondary | ICD-10-CM

## 2021-06-15 DIAGNOSIS — G8929 Other chronic pain: Secondary | ICD-10-CM

## 2021-06-15 DIAGNOSIS — M159 Polyosteoarthritis, unspecified: Secondary | ICD-10-CM

## 2021-06-15 LAB — GLUCOSE, CAPILLARY: Glucose-Capillary: 70 mg/dL (ref 70–99)

## 2021-06-15 LAB — POCT GLYCOSYLATED HEMOGLOBIN (HGB A1C): Hemoglobin A1C: 5.7 % — AB (ref 4.0–5.6)

## 2021-06-15 MED ORDER — METFORMIN HCL 500 MG PO TABS: 1000.0000 mg | ORAL_TABLET | Freq: Two times a day (BID) | ORAL | 3 refills | Status: DC

## 2021-06-15 MED ORDER — LOSARTAN POTASSIUM 50 MG PO TABS: 50.0000 mg | ORAL_TABLET | Freq: Every day | ORAL | 3 refills | Status: DC

## 2021-06-15 MED ORDER — ROSUVASTATIN CALCIUM 10 MG PO TABS: 10.0000 mg | ORAL_TABLET | Freq: Every day | ORAL | 3 refills | Status: DC

## 2021-06-15 MED ORDER — CLOPIDOGREL BISULFATE 75 MG PO TABS: 75.0000 mg | ORAL_TABLET | Freq: Every day | ORAL | 0 refills | Status: DC

## 2021-06-15 NOTE — Patient Instructions (Addendum)
[name], it was a pleasure seeing you today!  Today we discussed:  Diabetes. Your A1C is 5.7 today.  -DISCONTINUE GLIPIZIDE -Continue metformin and crestor  Hypertension -START LOSARTAN  -Continue chlorthalidone  Arthritis -Try Voltaren gel to your knees and neck -If you would like to consider a steroid knee injection in the future, please arrange an office visit for that  History of stroke -Continue taking plavix and aspirin  Please make sure to arrive 15 minutes prior to your next appointment. If you arrive late, you may be asked to reschedule.   We look forward to seeing you next time. Please call our clinic at [phone] if you have any questions or concerns. The best time to call is Monday-Friday from 9am-4pm, but there is someone available 24/7. If after hours or the weekend, call the main hospital number and ask for the Internal Medicine Resident On-Call. If you need medication refills, please notify your pharmacy one week in advance and they will send [place] a request.  Thank you for letting us take part in your care. Wishing you the best!

## 2021-06-16 ENCOUNTER — Encounter: Payer: Self-pay | Admitting: Internal Medicine

## 2021-06-16 DIAGNOSIS — G8929 Other chronic pain: Secondary | ICD-10-CM | POA: Insufficient documentation

## 2021-06-16 DIAGNOSIS — Z0000 Encounter for general adult medical examination without abnormal findings: Secondary | ICD-10-CM | POA: Insufficient documentation

## 2021-06-16 DIAGNOSIS — R4189 Other symptoms and signs involving cognitive functions and awareness: Secondary | ICD-10-CM | POA: Insufficient documentation

## 2021-06-16 DIAGNOSIS — Z7689 Persons encountering health services in other specified circumstances: Secondary | ICD-10-CM | POA: Insufficient documentation

## 2021-06-16 LAB — LIPID PANEL
Chol/HDL Ratio: 2.2 ratio (ref 0.0–4.4)
Cholesterol, Total: 149 mg/dL (ref 100–199)
HDL: 67 mg/dL (ref >39)
LDL Chol Calc (NIH): 61 mg/dL (ref 0–99)
Triglycerides: 119 mg/dL (ref 0–149)
VLDL Cholesterol Cal: 21 mg/dL (ref 5–40)

## 2021-06-16 LAB — CMP14 + ANION GAP
ALT: 10 IU/L (ref 0–32)
AST: 20 IU/L (ref 0–40)
Albumin/Globulin Ratio: 1.4 (ref 1.2–2.2)
Albumin: 4.3 g/dL (ref 3.7–4.7)
Alkaline Phosphatase: 83 IU/L (ref 44–121)
Anion Gap: 16 mmol/L (ref 10.0–18.0)
BUN/Creatinine Ratio: 15 (ref 12–28)
BUN: 15 mg/dL (ref 8–27)
Bilirubin Total: <0.2 mg/dL (ref 0.0–1.2)
CO2: 25 mmol/L (ref 20–29)
Calcium: 9.4 mg/dL (ref 8.7–10.3)
Chloride: 98 mmol/L (ref 96–106)
Creatinine, Ser: 1.03 mg/dL — ABNORMAL HIGH (ref 0.57–1.00)
Globulin, Total: 3.1 g/dL (ref 1.5–4.5)
Glucose: 72 mg/dL (ref 70–99)
Potassium: 3.5 mmol/L (ref 3.5–5.2)
Sodium: 139 mmol/L (ref 134–144)
Total Protein: 7.4 g/dL (ref 6.0–8.5)
eGFR: 57 mL/min/{1.73_m2} — ABNORMAL LOW (ref >59)

## 2021-06-16 NOTE — Assessment & Plan Note (Addendum)
Current medications: Metformin 1000 mg twice daily, glipizide 5 mg twice daily Secondary agents: Crestor 10 mg daily, aspirin 81 mg daily CBG 70, A1c 5.7. LDL 61 Due for retinopathy screening - Discontinue glipizide - Continue metformin, Crestor, and aspirin - Referred to ophthalmology - Perform foot exam at next office visit - LDL goal less than 70 due to history of stroke

## 2021-06-16 NOTE — Assessment & Plan Note (Addendum)
She reports a long history of osteoarthritis.  Today, she reports the majority of her pain being located in her neck and her left knee.  She has had a prior ACDF (cervical fusion) back in 2021.  She did not recall having the surgery.  She has had prior x-rays of her left knee however these were not done with weightbearing.  I suspect that she does have osteoarthritis in her knees as well.  She has been using lidocaine patches as well as compression on her knee for relief of symptoms. - Recommended conservative management with topical anti-inflammatory and head. Avoid NSAIDs.  She declined intra-articular steroid injections today.

## 2021-06-16 NOTE — Assessment & Plan Note (Signed)
Blood pressures were above goal in the office today-- 146/69, 147/68 on repeat.  She is currently prescribed chlorthalidone 25 mg and losartan 50 mg.  She brought her medication bottles with her to her appointment today and losartan was not amongst them. - CMP obtained at her visit today-- GFR 57, stable from prior.  No electrolyte derangements - We will have her start taking losartan 50 mg. - Continue chlorthalidone 25 mg

## 2021-06-16 NOTE — Assessment & Plan Note (Addendum)
6-CIT score in 09/2020 was 23.  She exhibited memory loss during her appointment today, both immediate and long term. E.g: asking the same question multiple times, forgetting she had a cervical fusion 2 years ago. She additionally exhibited rapid mood changes, agitation, and paranoia. Due to time limitations at her initial visit with [place] today, we were unable to perform a MOCA. She has had a prior stroke and has had multiple head CTs which reveal cerebral atrophy with chronic microvascular disease changes.  I did speak with her grandaughter, who brought her to today's appointment. She was not very forthcoming about information but reports that [place] is able to perform her own ADL's but does need help with some iADLs. Based on chart review, it does appear that she had had a fair number of falls.  I expressed my concerns about her memory however her granddaughter was not engaged in the conversation and did not seem to want to speak about it further, stating that the patient was doing fine.  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 Finally, she seems to have extremely low health literacy, which likely plays a role. It does not seem that there are any immediate safety concerns indicating a need for APS to get involved.   Plan --Recommend performing a MOCA/Mini-Cog if she returns for future visits --Recommend working on her electing a HPOA for future needs.

## 2021-06-16 NOTE — Assessment & Plan Note (Addendum)
Pt has been seen in the ED ~76 times in the past 12 months. Her most frequent presentations seem to be for chronic headaches and for routine management of chronic medical conditions. We discussed this at her visit today. I asked what her barriers were to obtaining care for routine and non-emergent needs in a primary care office--she reports that the ED is closer to her house and that she doesn't like going to doctors' offices.  In regards to transportation, I explained that her united medicare would likely provide transportation and that she would just need to call them to get it arranged. Upon further discussion about this however, she said that she simply prefers the ER and doesn't want a primary care doctor.  In regards to distance from her house, I did look and it doesn't appear that there are any provider offices in [place], where she resides.  She additionally mentioned that she gets bored at home and that her daughter and granddaughter do not visit enough. I did bring up [facility] since they have a comprehensive service as well as options for transportation and social activities during the day. She was not interested in pursuing this either. She did ask for money from myself and my nurse at her visit today, saying that she wanted to get lunch for her and her daughter. When I further delved into this however, she denies food insecurities at home. She denies any long term needs aside from transportation.  I strongly suspect that her frequent ED visits are multifactorial including disease (headaches, arthritis, etc), socioeconomic barriers (transportation), cognitive impairment, low health literacy, loneliness. There are ways to overcome some of these and hopefully reduce her number of ED visits for non-emergent things, however she would need to be willing to see a primary care physician and have a social worker to help with the underlying issues. At the end of today's visit, she did not seem that  she would be willing to do this unfortunately. I still invited her to return to our office for ongoing evaluation and management of her chronic conditions, should she decide to do so. I considered placing a referral to our clinic's social worker however pt denies having any needs at this time, as did her granddaughter.

## 2021-06-16 NOTE — Progress Notes (Signed)
New Patient Office Visit  Subjective    Patient ID: [name], female    DOB: [birth year]  Age: 76 y.o. MRN: [id number]  CC:  Chief Complaint  Patient presents with   Follow-up    FROM ED    HPI [name] is a chronically ill 76 year old female with hypertension, type 2 diabetes, history of stroke.  She is presenting today for follow-up from the ED.  Please refer to problem based charting for assessment and plan  Current medications Aspirin 81 mg daily Plavix 75 mg daily Crestor 10 mg daily Albuterol 108 (90 base) micrograms per ACT inhaler 1 to 2 puff every 6 hours as needed Chlorthalidone 25 mg daily Losartan 50 mg daily (not taking) Metformin 1000 mg twice daily Gabapentin 100 mg daily   Past Medical History:  Diagnosis Date   Diabetes mellitus without complication (HCC)    GERD 09/04/2006   Qualifier: Diagnosis of  By: Duke Salvia     History of echocardiogram    a. 2D ECHO: 11/06/2013 EF 65%; no WMA. Mild TR. PA pk pressure 43 mm HG   Hypertension    Stroke [facility])     Past Surgical History:  Procedure Laterality Date   ABDOMINAL HYSTERECTOMY     ANTERIOR CERVICAL DECOMPRESSION/DISCECTOMY FUSION 4 LEVELS N/A 11/11/2019   Procedure: Cervical three-four Cervical four-five Cervical five-six Cervical six-seven  Anterior cervical decompression/discectomy/fusion;  Surgeon: Maeola Harman, MD;  Location: [facility] OR;  Service: Neurosurgery;  Laterality: N/A;   LEFT HEART CATHETERIZATION WITH CORONARY ANGIOGRAM N/A 11/05/2013   Procedure: LEFT HEART CATHETERIZATION WITH CORONARY ANGIOGRAM;  Surgeon: Lennette Bihari, MD;  Location: [facility] CATH LAB;  Service: Cardiovascular;  Laterality: N/A;    Patient unable to recall family history  Social History   Socioeconomic History   Marital status: Widowed    Spouse name: Not on file   Number of children: Not on file   Years of education: Not on file   Highest education level: Not on file  Occupational History   Not on file   Tobacco Use   Smoking status: Never   Smokeless tobacco: Never  Vaping Use   Vaping Use: Never used  Substance and Sexual Activity   Alcohol use: No   Drug use: No   Sexual activity: Not Currently  Other Topics Concern   Not on file  Social History Narrative   Not on file   Social Determinants of Health   Financial Resource Strain: Low Risk    Difficulty of Paying Living Expenses: Not hard at all  Food Insecurity: No Food Insecurity   Worried About Running Out of Food in the Last Year: Never true   Ran Out of Food in the Last Year: Never true  Transportation Needs: No Transportation Needs   Lack of Transportation (Medical): No   Lack of Transportation (Non-Medical): No  Physical Activity: Insufficiently Active   Days of Exercise per Week: 2 days   Minutes of Exercise per Session: 60 min  Stress: No Stress Concern Present   Feeling of Stress : Not at all  Social Connections: Socially Isolated   Frequency of Communication with Friends and Family: Three times a week   Frequency of Social Gatherings with Friends and Family: Never   Attends Religious Services: Never   Database administrator or Organizations: No   Attends Banker Meetings: Never   Marital Status: Widowed  Intimate Partner Violence: Not At Risk   Fear of Current or Ex-Partner:  No   Emotionally Abused: No   Physically Abused: No   Sexually Abused: No    Objective    BP (!) 147/68 (BP Location: Left Arm, Patient Position: Sitting, Cuff Size: Normal)   Pulse 80   Wt 125 lb (56.7 kg)   SpO2 99%   BMI 21.46 kg/m   General: Frail appearing elderly female sitting in a transport chair with a coat and a scarf on HEENT: Moist mucous membranes Cardiac: Heart regular rate and rhythm, no lower extremity edema Pulm: Breathing comfortably on room air, lung sounds are clear GI: Abdomen soft, nondistended Skin: Warm and dry MSK: Increased muscle tonicity over the bilateral trapezius muscles, no  midline tenderness Psych: During the initial part of our interview, mood and affect seemed fairly normal however later in the interview, she became agitated.  I had stepped out of the room to have our nurse recheck her blood pressure at which time she had asked the nurse for money in order to buy herself and her granddaughter lunch.  When I reentered the room, patient also asked me for it.  I politely declined as had my nurse.  She denied food insecurities at home.  Patient became progressively more agitated and was perseverating around this and essentially got to the point that she refused to speak further to me. Assessment & Plan:   Problem List Items Addressed This Visit       Cardiovascular and Mediastinum   Essential hypertension (Chronic)    Blood pressures were above goal in the office today-- 146/69, 147/68 on repeat.  She is currently prescribed chlorthalidone 25 mg and losartan 50 mg.  She brought her medication bottles with her to her appointment today and losartan was not amongst them. - CMP obtained at her visit today-- GFR 57, stable from prior.  No electrolyte derangements - We will have her start taking losartan 50 mg. - Continue chlorthalidone 25 mg       Relevant Medications   losartan (COZAAR) 50 MG tablet   rosuvastatin (CRESTOR) 10 MG tablet   Other Relevant Orders   CMP14 + Anion Gap (Completed)   Lipid panel (Completed)     Endocrine   Type 2 diabetes mellitus with hyperlipidemia (HCC) - Primary (Chronic)    Current medications: Metformin 1000 mg twice daily, glipizide 5 mg twice daily Secondary agents: Crestor 10 mg daily, aspirin 81 mg daily CBG 70, A1c 5.7. LDL 61 Due for retinopathy screening - Discontinue glipizide - Continue metformin, Crestor, and aspirin - Referred to ophthalmology - Perform foot exam at next office visit - LDL goal less than 70 due to history of stroke       Relevant Medications   losartan (COZAAR) 50 MG tablet   metFORMIN  (GLUCOPHAGE) 500 MG tablet   rosuvastatin (CRESTOR) 10 MG tablet     Musculoskeletal and Integument   Osteoarthritis (Chronic)    She reports a long history of osteoarthritis.  Today, she reports the majority of her pain being located in her neck and her left knee.  She has had a prior ACDF (cervical fusion) back in 2021.  She did not recall having the surgery.  She has had prior x-rays of her left knee however these were not done with weightbearing.  I suspect that she does have osteoarthritis in her knees as well.  She has been using lidocaine patches as well as compression on her knee for relief of symptoms. - Recommended conservative management with topical anti-inflammatory and head.  Avoid NSAIDs.  She declined intra-articular steroid injections today.          Other   History of stroke (Chronic)    Continue statin and aspirin.       Chronic headache (Chronic)    Many of her recent ED visits have been due to severe headaches. Brain imaging has been unrevealing. Pt reports that the pain involves her entire head and feels like it is in a vice. No associated photophobia, nausea, or vomiting. Reports a long history of headaches and denies recent change in quality of these headaches over time. Pain can last from hours to days.  Recommend further evaluation if she returns to the office. Suspect tension headaches related to her chronic neck pain and muscle tension.        Healthcare maintenance (Chronic)    Pt declines colon and breast cancer screening today. Due to her poor functional status, I think this is reasonable. She also declines vaccination today.        Frequent patient in emergency department (Chronic)    Pt has been seen in the ED ~76 times in the past 12 months. Her most frequent presentations seem to be for chronic headaches and for routine management of chronic medical conditions. We discussed this at her visit today. I asked what her barriers were to obtaining care for  routine and non-emergent needs in a primary care office--she reports that the ED is closer to her house and that she doesn't like going to doctors' offices.  In regards to transportation, I explained that her united medicare would likely provide transportation and that she would just need to call them to get it arranged. Upon further discussion about this however, she said that she simply prefers the ER and doesn't want a primary care doctor.  In regards to distance from her house, I did look and it doesn't appear that there are any provider offices in [place], where she resides.  She additionally mentioned that she gets bored at home and that her daughter and granddaughter do not visit enough. I did bring up [facility] since they have a comprehensive service as well as options for transportation and social activities during the day. She was not interested in pursuing this either. She did ask for money from myself and my nurse at her visit today, saying that she wanted to get lunch for her and her daughter. When I further delved into this however, she denies food insecurities at home. She denies any long term needs aside from transportation.  I strongly suspect that her frequent ED visits are multifactorial including disease (headaches, arthritis, etc), socioeconomic barriers (transportation), cognitive impairment, low health literacy, loneliness. There are ways to overcome some of these and hopefully reduce her number of ED visits for non-emergent things, however she would need to be willing to see a primary care physician and have a social worker to help with the underlying issues. At the end of today's visit, she did not seem that she would be willing to do this unfortunately. I still invited her to return to our office for ongoing evaluation and management of her chronic conditions, should she decide to do so. I considered placing a referral to our clinic's social worker however pt denies having any  needs at this time, as did her granddaughter.        Cognitive impairment    6-CIT score in 09/2020 was 23.  She exhibited memory loss during her appointment today, both immediate and  long term. E.g: asking the same question multiple times, forgetting she had a cervical fusion 2 years ago. She additionally exhibited rapid mood changes, agitation, and paranoia. Due to time limitations at her initial visit with us today, we were unable to perform a MOCA. She has had a prior stroke and has had multiple head CTs which reveal cerebral atrophy with chronic microvascular disease changes.  I did speak with her grandaughter, who brought her to today's appointment. She was not very forthcoming about information but reports that Marylouise StacksWilma is able to perform her own ADL's but does need help with some iADLs. Based on chart review, it does appear that she had had a fair number of falls.  I expressed my concerns about her memory however her granddaughter was not engaged in the conversation and did not seem to want to speak about it further, stating that the patient was doing fine.  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 Finally, she seems to have extremely low health literacy, which likely plays a role. It does not seem that there are any immediate safety concerns indicating a need for APS to get involved.   Plan --Recommend performing a MOCA/Mini-Cog if she returns for future visits --Recommend working on her electing a HPOA for future needs.       Other Visit Diagnoses     History of CVA (cerebrovascular accident)       Relevant Medications   clopidogrel (PLAVIX) 75 MG tablet   rosuvastatin (CRESTOR) 10 MG tablet   Other Relevant Orders   Lipid panel (Completed)       She was invited  to return for further discussion regarding daily headaches and osteoarthritis --obtain MOCA --discuss HCPOA --follow up blood pressure--obtain BMP  Pt discussed with Dr. Johny ShockVincent  Nataliee Shurtz, MD

## 2021-06-16 NOTE — Assessment & Plan Note (Signed)
Management per specialist. 

## 2021-06-16 NOTE — Assessment & Plan Note (Signed)
Many of her recent ED visits have been due to severe headaches. Brain imaging has been unrevealing. Pt reports that the pain involves her entire head and feels like it is in a vice. No associated photophobia, nausea, or vomiting. Reports a long history of headaches and denies recent change in quality of these headaches over time. Pain can last from hours to days.  Recommend further evaluation if she returns to the office. Suspect tension headaches related to her chronic neck pain and muscle tension.

## 2021-06-16 NOTE — Assessment & Plan Note (Signed)
Pt declines colon and breast cancer screening today. Due to her poor functional status, I think this is reasonable. She also declines vaccination today.

## 2021-06-17 ENCOUNTER — Other Ambulatory Visit: Payer: Self-pay | Admitting: Nurse Practitioner

## 2021-06-20 ENCOUNTER — Encounter: Payer: Self-pay | Admitting: Internal Medicine

## 2021-06-20 NOTE — Progress Notes (Signed)
Internal Medicine Clinic Attending  Case discussed with Dr. Christian  At the time of the visit.  We reviewed the resident's history and exam and pertinent patient test results.  I agree with the assessment, diagnosis, and plan of care documented in the resident's note.  

## 2021-06-20 NOTE — Progress Notes (Signed)
Results mailed to  pt.

## 2021-07-04 ENCOUNTER — Emergency Department (HOSPITAL_COMMUNITY)
Admission: EM | Admit: 2021-07-04 | Discharge: 2021-07-05 | Disposition: A | Discharge status: Home / Self Care | Payer: Medicare Other | Attending: Emergency Medicine | Admitting: Emergency Medicine

## 2021-07-04 DIAGNOSIS — E119 Type 2 diabetes mellitus without complications: Secondary | ICD-10-CM | POA: Diagnosis not present

## 2021-07-04 DIAGNOSIS — Z7982 Long term (current) use of aspirin: Secondary | ICD-10-CM | POA: Insufficient documentation

## 2021-07-04 DIAGNOSIS — Z7902 Long term (current) use of antithrombotics/antiplatelets: Secondary | ICD-10-CM | POA: Insufficient documentation

## 2021-07-04 DIAGNOSIS — G8921 Chronic pain due to trauma: Secondary | ICD-10-CM | POA: Diagnosis not present

## 2021-07-04 DIAGNOSIS — H538 Other visual disturbances: Secondary | ICD-10-CM | POA: Insufficient documentation

## 2021-07-04 DIAGNOSIS — Z7984 Long term (current) use of oral hypoglycemic drugs: Secondary | ICD-10-CM | POA: Insufficient documentation

## 2021-07-04 DIAGNOSIS — I10 Essential (primary) hypertension: Secondary | ICD-10-CM | POA: Insufficient documentation

## 2021-07-04 DIAGNOSIS — M79605 Pain in left leg: Secondary | ICD-10-CM | POA: Insufficient documentation

## 2021-07-04 DIAGNOSIS — M79602 Pain in left arm: Secondary | ICD-10-CM | POA: Insufficient documentation

## 2021-07-04 DIAGNOSIS — Z79899 Other long term (current) drug therapy: Secondary | ICD-10-CM | POA: Diagnosis not present
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
## 2021-07-05 MED ORDER — KETOROLAC TROMETHAMINE 60 MG/2ML IM SOLN: 15.0000 mg | Freq: Once | INTRAMUSCULAR | Status: DC

## 2021-07-05 MED ORDER — DICLOFENAC SODIUM 1 % EX GEL
2.0000 g | Freq: Four times a day (QID) | CUTANEOUS | 0 refills | Status: DC | PRN
Start: 2021-07-05 — End: 2021-09-24

## 2021-07-05 MED ORDER — ACETAMINOPHEN 500 MG PO TABS
1000.0000 mg | ORAL_TABLET | Freq: Once | ORAL | Status: AC
  Administered 2021-07-05: 1000 mg via ORAL
  Filled 2021-07-05: qty 2
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
## 2021-07-17 ENCOUNTER — Other Ambulatory Visit: Payer: Self-pay

## 2021-07-17 ENCOUNTER — Emergency Department (HOSPITAL_COMMUNITY)
Admission: EM | Admit: 2021-07-17 | Discharge: 2021-07-18 | Disposition: A | Discharge status: Home / Self Care | Payer: Medicare Other | Attending: Emergency Medicine | Admitting: Emergency Medicine

## 2021-07-17 ENCOUNTER — Encounter (HOSPITAL_COMMUNITY): Payer: Self-pay

## 2021-07-17 DIAGNOSIS — G8929 Other chronic pain: Secondary | ICD-10-CM | POA: Insufficient documentation

## 2021-07-17 DIAGNOSIS — M25512 Pain in left shoulder: Secondary | ICD-10-CM | POA: Diagnosis present

## 2021-07-17 DIAGNOSIS — E119 Type 2 diabetes mellitus without complications: Secondary | ICD-10-CM | POA: Diagnosis not present

## 2021-07-17 DIAGNOSIS — Z79899 Other long term (current) drug therapy: Secondary | ICD-10-CM | POA: Diagnosis not present

## 2021-07-17 DIAGNOSIS — M25511 Pain in right shoulder: Secondary | ICD-10-CM | POA: Insufficient documentation

## 2021-07-17 DIAGNOSIS — I10 Essential (primary) hypertension: Secondary | ICD-10-CM | POA: Diagnosis not present

## 2021-07-17 DIAGNOSIS — Z7984 Long term (current) use of oral hypoglycemic drugs: Secondary | ICD-10-CM | POA: Insufficient documentation

## 2021-07-17 DIAGNOSIS — Z7902 Long term (current) use of antithrombotics/antiplatelets: Secondary | ICD-10-CM | POA: Diagnosis not present

## 2021-07-17 DIAGNOSIS — Z7982 Long term (current) use of aspirin: Secondary | ICD-10-CM | POA: Insufficient documentation

## 2021-07-18 NOTE — ED Notes (Signed)
Bus pass provided.
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
## 2021-08-02 ENCOUNTER — Emergency Department (HOSPITAL_COMMUNITY): Payer: Medicare Other

## 2021-08-02 ENCOUNTER — Other Ambulatory Visit: Payer: Self-pay

## 2021-08-02 ENCOUNTER — Encounter (HOSPITAL_COMMUNITY): Payer: Self-pay

## 2021-08-02 ENCOUNTER — Emergency Department (HOSPITAL_COMMUNITY)
Admission: EM | Admit: 2021-08-02 | Discharge: 2021-08-03 | Disposition: A | Discharge status: Home / Self Care | Payer: Medicare Other | Attending: Emergency Medicine | Admitting: Emergency Medicine

## 2021-08-02 DIAGNOSIS — S300XXA Contusion of lower back and pelvis, initial encounter: Secondary | ICD-10-CM | POA: Insufficient documentation

## 2021-08-02 DIAGNOSIS — Z7982 Long term (current) use of aspirin: Secondary | ICD-10-CM | POA: Insufficient documentation

## 2021-08-02 DIAGNOSIS — Z7984 Long term (current) use of oral hypoglycemic drugs: Secondary | ICD-10-CM | POA: Diagnosis not present

## 2021-08-02 DIAGNOSIS — W19XXXA Unspecified fall, initial encounter: Secondary | ICD-10-CM

## 2021-08-02 DIAGNOSIS — I10 Essential (primary) hypertension: Secondary | ICD-10-CM | POA: Diagnosis not present

## 2021-08-02 DIAGNOSIS — M542 Cervicalgia: Secondary | ICD-10-CM | POA: Diagnosis not present

## 2021-08-02 DIAGNOSIS — S3993XA Unspecified injury of pelvis, initial encounter: Secondary | ICD-10-CM | POA: Diagnosis present

## 2021-08-02 DIAGNOSIS — Z7902 Long term (current) use of antithrombotics/antiplatelets: Secondary | ICD-10-CM | POA: Insufficient documentation

## 2021-08-02 DIAGNOSIS — M25552 Pain in left hip: Secondary | ICD-10-CM | POA: Insufficient documentation

## 2021-08-02 DIAGNOSIS — E119 Type 2 diabetes mellitus without complications: Secondary | ICD-10-CM | POA: Diagnosis not present

## 2021-08-02 DIAGNOSIS — R519 Headache, unspecified: Secondary | ICD-10-CM | POA: Diagnosis not present
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
## 2021-08-03 ENCOUNTER — Emergency Department (HOSPITAL_COMMUNITY): Payer: Medicare Other

## 2021-08-03 DIAGNOSIS — S300XXA Contusion of lower back and pelvis, initial encounter: Secondary | ICD-10-CM | POA: Diagnosis not present

## 2021-08-03 MED ORDER — LIDOCAINE 5 % EX PTCH
1.0000 | MEDICATED_PATCH | CUTANEOUS | Status: DC
  Administered 2021-08-03: 1 via TRANSDERMAL
  Filled 2021-08-03: qty 1

## 2021-08-03 MED ORDER — LIDOCAINE 4 % EX PTCH: 1.0000 | MEDICATED_PATCH | CUTANEOUS | 0 refills | Status: DC

## 2021-08-03 NOTE — Discharge Instructions (Signed)
Apply ice to any sore area.  Ice can be applied for 20-30 minutes at a time, as often as 4 times a day.  You may take acetaminophen as needed for pain.  Apply the lidocaine patch once a day.
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
## 2021-08-14 ENCOUNTER — Other Ambulatory Visit: Payer: Self-pay

## 2021-08-14 ENCOUNTER — Emergency Department (HOSPITAL_COMMUNITY)
Admission: EM | Admit: 2021-08-14 | Discharge: 2021-08-15 | Disposition: A | Discharge status: Home / Self Care | Payer: Medicare Other | Attending: Emergency Medicine | Admitting: Emergency Medicine

## 2021-08-14 DIAGNOSIS — M25511 Pain in right shoulder: Secondary | ICD-10-CM | POA: Diagnosis not present

## 2021-08-14 DIAGNOSIS — G8929 Other chronic pain: Secondary | ICD-10-CM | POA: Insufficient documentation

## 2021-08-14 DIAGNOSIS — M25512 Pain in left shoulder: Secondary | ICD-10-CM

## 2021-08-14 DIAGNOSIS — R5381 Other malaise: Secondary | ICD-10-CM

## 2021-08-14 DIAGNOSIS — R519 Headache, unspecified: Secondary | ICD-10-CM | POA: Diagnosis present

## 2021-08-14 DIAGNOSIS — Z7982 Long term (current) use of aspirin: Secondary | ICD-10-CM | POA: Diagnosis not present

## 2021-08-14 DIAGNOSIS — H538 Other visual disturbances: Secondary | ICD-10-CM

## 2021-08-14 DIAGNOSIS — Z79899 Other long term (current) drug therapy: Secondary | ICD-10-CM | POA: Insufficient documentation

## 2021-08-14 LAB — BASIC METABOLIC PANEL
Anion gap: 8 (ref 5–15)
BUN: 10 mg/dL (ref 8–23)
CO2: 29 mmol/L (ref 22–32)
Calcium: 8.7 mg/dL — ABNORMAL LOW (ref 8.9–10.3)
Chloride: 104 mmol/L (ref 98–111)
Creatinine, Ser: 0.89 mg/dL (ref 0.44–1.00)
GFR, Estimated: >60 mL/min (ref >60)
Glucose, Bld: 81 mg/dL (ref 70–99)
Potassium: 4.1 mmol/L (ref 3.5–5.1)
Sodium: 141 mmol/L (ref 135–145)

## 2021-08-14 LAB — TROPONIN I (HIGH SENSITIVITY): Troponin I (High Sensitivity): 7 ng/L (ref <18)

## 2021-08-14 LAB — CBC WITH DIFFERENTIAL/PLATELET
Abs Immature Granulocytes: 0.02 10*3/uL (ref 0.00–0.07)
Basophils Absolute: 0 10*3/uL (ref 0.0–0.1)
Basophils Relative: 1 %
Eosinophils Absolute: 0.2 10*3/uL (ref 0.0–0.5)
Eosinophils Relative: 2 %
HCT: 32.1 % — ABNORMAL LOW (ref 36.0–46.0)
Hemoglobin: 10.4 g/dL — ABNORMAL LOW (ref 12.0–15.0)
Immature Granulocytes: 0 %
Lymphocytes Relative: 23 %
Lymphs Abs: 1.9 10*3/uL (ref 0.7–4.0)
MCH: 29.8 pg (ref 26.0–34.0)
MCHC: 32.4 g/dL (ref 30.0–36.0)
MCV: 92 fL (ref 80.0–100.0)
Monocytes Absolute: 0.6 10*3/uL (ref 0.1–1.0)
Monocytes Relative: 8 %
Neutro Abs: 5.2 10*3/uL (ref 1.7–7.7)
Neutrophils Relative %: 66 %
Platelets: 210 10*3/uL (ref 150–400)
RBC: 3.49 MIL/uL — ABNORMAL LOW (ref 3.87–5.11)
RDW: 13.2 % (ref 11.5–15.5)
WBC: 7.9 10*3/uL (ref 4.0–10.5)
nRBC: 0 % (ref 0.0–0.2)

## 2021-08-14 NOTE — ED Provider Triage Note (Signed)
Emergency Medicine Provider Triage Evaluation Note  [name] , a 76 y.o. female  was evaluated in triage.  Pt complains of headache and hurting everywhere.  She has a headache this is chronic patient seen 37 times in the last 6 months.  Review of Systems  Positive: Headache Negative: Fever  Physical Exam  BP (!) 210/78 (BP Location: Right Arm)   Pulse 61   Temp 97.8 F (36.6 C) (Oral)   Resp 18   SpO2 99%  Gen:   Awake, no distress  Resp:  Normal effort  MSK:   Moves extremities without difficulty  Other:  No facial droop  Medical Decision Making  Medically screening exam initiated at 6:29 PM.  Appropriate orders placed.  [name] was informed that the remainder of the evaluation will be completed by another provider, this initial triage assessment does not replace that evaluation, and the importance of remaining in the ED until their evaluation is complete.  Work-up initiated   Ananda, Caya, PA-C 08/14/21 1831
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
## 2021-08-15 NOTE — Discharge Instructions (Signed)
Your headache is probably caused by multiple things including stress, tension, and neck problems.  The shoulder pain is likely caused by rotator cuff problems.  Both these painful conditions can be treated with Tylenol and heat to the affected areas.  For the blurred vision, follow-up with your primary care doctor for a checkup in 1 or 2 weeks.  You could also call the ophthalmologist, Dr. Allena Katz if you want to see a eye specialist about it.

## 2021-08-17 ENCOUNTER — Emergency Department (HOSPITAL_COMMUNITY): Payer: Medicare Other

## 2021-08-17 ENCOUNTER — Other Ambulatory Visit: Payer: Self-pay

## 2021-08-17 ENCOUNTER — Emergency Department (HOSPITAL_COMMUNITY)
Admission: EM | Admit: 2021-08-17 | Discharge: 2021-08-18 | Disposition: A | Discharge status: Home / Self Care | Payer: Medicare Other | Attending: Emergency Medicine | Admitting: Emergency Medicine

## 2021-08-17 DIAGNOSIS — Z7902 Long term (current) use of antithrombotics/antiplatelets: Secondary | ICD-10-CM | POA: Insufficient documentation

## 2021-08-17 DIAGNOSIS — E119 Type 2 diabetes mellitus without complications: Secondary | ICD-10-CM | POA: Diagnosis not present

## 2021-08-17 DIAGNOSIS — Z7982 Long term (current) use of aspirin: Secondary | ICD-10-CM | POA: Insufficient documentation

## 2021-08-17 DIAGNOSIS — Z8673 Personal history of transient ischemic attack (TIA), and cerebral infarction without residual deficits: Secondary | ICD-10-CM | POA: Insufficient documentation

## 2021-08-17 DIAGNOSIS — Z7984 Long term (current) use of oral hypoglycemic drugs: Secondary | ICD-10-CM | POA: Insufficient documentation

## 2021-08-17 DIAGNOSIS — R52 Pain, unspecified: Secondary | ICD-10-CM

## 2021-08-17 DIAGNOSIS — W19XXXD Unspecified fall, subsequent encounter: Secondary | ICD-10-CM

## 2021-08-17 DIAGNOSIS — I10 Essential (primary) hypertension: Secondary | ICD-10-CM | POA: Diagnosis not present

## 2021-08-17 DIAGNOSIS — M25512 Pain in left shoulder: Secondary | ICD-10-CM | POA: Diagnosis not present

## 2021-08-17 DIAGNOSIS — Z79899 Other long term (current) drug therapy: Secondary | ICD-10-CM | POA: Diagnosis not present

## 2021-08-17 DIAGNOSIS — R519 Headache, unspecified: Secondary | ICD-10-CM | POA: Insufficient documentation

## 2021-08-17 DIAGNOSIS — R079 Chest pain, unspecified: Secondary | ICD-10-CM | POA: Insufficient documentation

## 2021-08-17 LAB — CBC WITH DIFFERENTIAL/PLATELET
Abs Immature Granulocytes: 0.01 10*3/uL (ref 0.00–0.07)
Basophils Absolute: 0 10*3/uL (ref 0.0–0.1)
Basophils Relative: 1 %
Eosinophils Absolute: 0.2 10*3/uL (ref 0.0–0.5)
Eosinophils Relative: 2 %
HCT: 32.4 % — ABNORMAL LOW (ref 36.0–46.0)
Hemoglobin: 10.1 g/dL — ABNORMAL LOW (ref 12.0–15.0)
Immature Granulocytes: 0 %
Lymphocytes Relative: 23 %
Lymphs Abs: 1.5 10*3/uL (ref 0.7–4.0)
MCH: 29.4 pg (ref 26.0–34.0)
MCHC: 31.2 g/dL (ref 30.0–36.0)
MCV: 94.2 fL (ref 80.0–100.0)
Monocytes Absolute: 0.6 10*3/uL (ref 0.1–1.0)
Monocytes Relative: 9 %
Neutro Abs: 4.3 10*3/uL (ref 1.7–7.7)
Neutrophils Relative %: 65 %
Platelets: 224 10*3/uL (ref 150–400)
RBC: 3.44 MIL/uL — ABNORMAL LOW (ref 3.87–5.11)
RDW: 13.3 % (ref 11.5–15.5)
WBC: 6.6 10*3/uL (ref 4.0–10.5)
nRBC: 0 % (ref 0.0–0.2)

## 2021-08-17 LAB — COMPREHENSIVE METABOLIC PANEL
ALT: 10 U/L (ref 0–44)
AST: 17 U/L (ref 15–41)
Albumin: 3.6 g/dL (ref 3.5–5.0)
Alkaline Phosphatase: 76 U/L (ref 38–126)
Anion gap: 6 (ref 5–15)
BUN: 13 mg/dL (ref 8–23)
CO2: 28 mmol/L (ref 22–32)
Calcium: 8.6 mg/dL — ABNORMAL LOW (ref 8.9–10.3)
Chloride: 106 mmol/L (ref 98–111)
Creatinine, Ser: 1.04 mg/dL — ABNORMAL HIGH (ref 0.44–1.00)
GFR, Estimated: 56 mL/min — ABNORMAL LOW (ref >60)
Glucose, Bld: 138 mg/dL — ABNORMAL HIGH (ref 70–99)
Potassium: 3.8 mmol/L (ref 3.5–5.1)
Sodium: 140 mmol/L (ref 135–145)
Total Bilirubin: 0.4 mg/dL (ref 0.3–1.2)
Total Protein: 7.5 g/dL (ref 6.5–8.1)

## 2021-08-17 LAB — TROPONIN I (HIGH SENSITIVITY)
Troponin I (High Sensitivity): 6 ng/L (ref <18)
Troponin I (High Sensitivity): 6 ng/L (ref <18)

## 2021-08-17 NOTE — ED Triage Notes (Signed)
Pt c/o HA, CP x1wk. Denies radiation, no stroke symptoms.

## 2021-08-17 NOTE — ED Provider Triage Note (Signed)
Emergency Medicine Provider Triage Evaluation Note  [name] , a 76 y.o. female  was evaluated in triage.  Pt complains of headache, chest pain has been ongoing for the past week.  Describing it as a generalized headache with elevated blood pressure despite taking her blood pressure medication.  Feels that her vision is blurry but is unsure when this started.  She does not wear glasses at baseline.  Also endorsing pain along her chest, worsened with any palpation..  Review of Systems  Positive: Headache, chest pain, vision changes Negative: Fever, sob  Physical Exam  BP (!) 209/74   Pulse 70   Temp 98.2 F (36.8 C) (Oral)   Resp 14   SpO2 96%  Gen:   Awake, no distress   Resp:  Normal effort  MSK:   Moves extremities without difficulty  Other:    Medical Decision Making  Medically screening exam initiated at 6:11 PM.  Appropriate orders placed.  [name] was informed that the remainder of the evaluation will be completed by another provider, this initial triage assessment does not replace that evaluation, and the importance of remaining in the ED until their evaluation is complete.     [name], PA-C 08/17/21 1813

## 2021-08-18 MED ORDER — LIDOCAINE 5 % EX PTCH: 1.0000 | MEDICATED_PATCH | CUTANEOUS | Status: DC

## 2021-08-18 MED ORDER — LIDOCAINE 5 % EX PTCH: 1.0000 | MEDICATED_PATCH | Freq: Every day | CUTANEOUS | 0 refills | Status: DC | PRN

## 2021-08-18 NOTE — Discharge Instructions (Signed)
You were seen in the emergency department today for headache and chest pain.  Your work-up was overall reassuring.  Your labs look similar to prior.  We have sent a prescription for Lidoderm patches to the pharmacy, please only utilize these as prescribed.  Do not apply heat over the patches.  We have prescribed you new medication(s) today. Discuss the medications prescribed today with your pharmacist as they can have adverse effects and interactions with your other medicines including over the counter and prescribed medications. Seek medical evaluation if you start to experience new or abnormal symptoms after taking one of these medicines, seek care immediately if you start to experience difficulty breathing, feeling of your throat closing, facial swelling, or rash as these could be indications of a more serious allergic reaction  Please follow-up with primary care within 1 week.  Have your blood pressure rechecked it was elevated in the ER.  Return to the ED for any new or worsening symptoms or any other concerns.
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
## 2021-08-21 ENCOUNTER — Emergency Department (HOSPITAL_COMMUNITY)
Admission: EM | Admit: 2021-08-21 | Discharge: 2021-08-22 | Disposition: A | Discharge status: Home / Self Care | Payer: Medicare Other | Attending: Emergency Medicine | Admitting: Emergency Medicine

## 2021-08-21 DIAGNOSIS — Z79899 Other long term (current) drug therapy: Secondary | ICD-10-CM | POA: Diagnosis not present

## 2021-08-21 DIAGNOSIS — Z7984 Long term (current) use of oral hypoglycemic drugs: Secondary | ICD-10-CM | POA: Diagnosis not present

## 2021-08-21 DIAGNOSIS — E119 Type 2 diabetes mellitus without complications: Secondary | ICD-10-CM | POA: Diagnosis not present

## 2021-08-21 DIAGNOSIS — G44229 Chronic tension-type headache, not intractable: Secondary | ICD-10-CM | POA: Insufficient documentation

## 2021-08-21 DIAGNOSIS — Z7982 Long term (current) use of aspirin: Secondary | ICD-10-CM | POA: Insufficient documentation

## 2021-08-21 DIAGNOSIS — I10 Essential (primary) hypertension: Secondary | ICD-10-CM | POA: Diagnosis not present

## 2021-08-21 NOTE — ED Provider Triage Note (Signed)
  Emergency Medicine Provider Triage Evaluation Note  MRN:  [id number]  Arrival date & time: 08/21/21    Medically screening exam initiated at 11:34 PM.   CC:   Hypertension   HPI:  [name] is a 76 y.o. year-old female presents to the ED with chief complaint of headache and elevated BP.  States that it has been like this for a week.  States she has been compliant in taking her meds.  She denies numbness or weakness, but she does feel fatigued.  History provided by patient. ROS:  -As included in HPI PE:   Vitals:   08/21/21 2320  BP: (!) 217/91  Pulse: 62  Resp: 18  Temp: 98.5 F (36.9 C)  SpO2: 98%    Non-toxic appearing No respiratory distress CN 3-12 grossly intact, moving all extremities MDM:   I've ordered labs in triage to expedite lab/diagnostic workup.  Patient was informed that the remainder of the evaluation will be completed by another provider, this initial triage assessment does not replace that evaluation, and the importance of remaining in the ED until their evaluation is complete.    Roxy Horseman, PA-C 08/21/21 2335

## 2021-08-21 NOTE — ED Triage Notes (Signed)
Pt c/o HTN, HA x "awhile," states BP was "over 200." Denies associated symptoms

## 2021-08-22 ENCOUNTER — Emergency Department (HOSPITAL_COMMUNITY): Payer: Medicare Other

## 2021-08-22 ENCOUNTER — Other Ambulatory Visit (HOSPITAL_COMMUNITY): Payer: Medicare Other

## 2021-08-22 DIAGNOSIS — I10 Essential (primary) hypertension: Secondary | ICD-10-CM | POA: Diagnosis not present

## 2021-08-22 LAB — BASIC METABOLIC PANEL
Anion gap: 12 (ref 5–15)
BUN: 11 mg/dL (ref 8–23)
CO2: 25 mmol/L (ref 22–32)
Calcium: 8.9 mg/dL (ref 8.9–10.3)
Chloride: 105 mmol/L (ref 98–111)
Creatinine, Ser: 1.03 mg/dL — ABNORMAL HIGH (ref 0.44–1.00)
GFR, Estimated: 57 mL/min — ABNORMAL LOW (ref >60)
Glucose, Bld: 100 mg/dL — ABNORMAL HIGH (ref 70–99)
Potassium: 3.5 mmol/L (ref 3.5–5.1)
Sodium: 142 mmol/L (ref 135–145)

## 2021-08-22 LAB — URINALYSIS, ROUTINE W REFLEX MICROSCOPIC
Bacteria, UA: NONE SEEN
Bilirubin Urine: NEGATIVE
Glucose, UA: NEGATIVE mg/dL
Hgb urine dipstick: NEGATIVE
Ketones, ur: 5 mg/dL — AB
Leukocytes,Ua: NEGATIVE
Nitrite: NEGATIVE
Protein, ur: 30 mg/dL — AB
Specific Gravity, Urine: 1.015 (ref 1.005–1.030)
pH: 7 (ref 5.0–8.0)

## 2021-08-22 LAB — CBC WITH DIFFERENTIAL/PLATELET
Abs Immature Granulocytes: 0.02 10*3/uL (ref 0.00–0.07)
Basophils Absolute: 0 10*3/uL (ref 0.0–0.1)
Basophils Relative: 1 %
Eosinophils Absolute: 0.1 10*3/uL (ref 0.0–0.5)
Eosinophils Relative: 1 %
HCT: 30.4 % — ABNORMAL LOW (ref 36.0–46.0)
Hemoglobin: 9.9 g/dL — ABNORMAL LOW (ref 12.0–15.0)
Immature Granulocytes: 0 %
Lymphocytes Relative: 22 %
Lymphs Abs: 1.6 10*3/uL (ref 0.7–4.0)
MCH: 30 pg (ref 26.0–34.0)
MCHC: 32.6 g/dL (ref 30.0–36.0)
MCV: 92.1 fL (ref 80.0–100.0)
Monocytes Absolute: 0.6 10*3/uL (ref 0.1–1.0)
Monocytes Relative: 8 %
Neutro Abs: 4.9 10*3/uL (ref 1.7–7.7)
Neutrophils Relative %: 68 %
Platelets: 230 10*3/uL (ref 150–400)
RBC: 3.3 MIL/uL — ABNORMAL LOW (ref 3.87–5.11)
RDW: 13.2 % (ref 11.5–15.5)
WBC: 7.3 10*3/uL (ref 4.0–10.5)
nRBC: 0 % (ref 0.0–0.2)

## 2021-08-22 MED ORDER — ACETAMINOPHEN 325 MG PO TABS
650.0000 mg | ORAL_TABLET | Freq: Once | ORAL | Status: AC
  Administered 2021-08-22: 650 mg via ORAL
  Filled 2021-08-22: qty 2

## 2021-08-22 MED ORDER — LOSARTAN POTASSIUM 50 MG PO TABS
50.0000 mg | ORAL_TABLET | Freq: Once | ORAL | Status: AC
  Administered 2021-08-22: 50 mg via ORAL
  Filled 2021-08-22: qty 1

## 2021-08-22 MED ORDER — CHLORTHALIDONE 25 MG PO TABS
25.0000 mg | ORAL_TABLET | Freq: Once | ORAL | Status: AC
  Administered 2021-08-22: 25 mg via ORAL
  Filled 2021-08-22: qty 1

## 2021-08-22 MED ORDER — LIDOCAINE 5 % EX PTCH
1.0000 | MEDICATED_PATCH | Freq: Once | CUTANEOUS | Status: DC
  Administered 2021-08-22: 1 via TRANSDERMAL
  Filled 2021-08-22: qty 1
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
## 2021-08-22 NOTE — ED Notes (Signed)
Discharge instructions reviewed with patient. Patient denies any questions or concerns. Pt brought out to lobby via wheelchair.

## 2021-08-22 NOTE — Discharge Instructions (Addendum)
You were seen in the emergency department for your high blood pressure and your headache.  You had no signs of any injury to your organs or to your brain from your blood pressure being high.  After reviewing the medications that you had with you you appear to be out of your blood pressure medications which is likely the cause of your high blood pressure.  You have refills for the chlorthalidone and losartan at your pharmacy and you should immediately restart taking your blood pressure.  You can also get a blood pressure cuff from the pharmacy and measure your blood pressure at home.  You should keep a log and measure your blood pressure at the same time every day when you have been relaxing for at least 15 minutes.  You can follow-up with the primary care clinic to have a blood pressure recheck to determine if you need any changes to your blood pressure medication.  You should return to the emergency department if your headaches significantly worsen, you have repetitive vomiting, you have numbness or weakness on one side of your body compared to the other, you become confused, you pass out, or if you have any other new or concerning symptoms.

## 2021-08-29 ENCOUNTER — Emergency Department (HOSPITAL_COMMUNITY): Payer: Medicare Other

## 2021-08-29 ENCOUNTER — Other Ambulatory Visit: Payer: Self-pay

## 2021-08-29 ENCOUNTER — Emergency Department (HOSPITAL_COMMUNITY)
Admission: EM | Admit: 2021-08-29 | Discharge: 2021-08-29 | Disposition: A | Discharge status: Home / Self Care | Payer: Medicare Other | Attending: Emergency Medicine | Admitting: Emergency Medicine

## 2021-08-29 ENCOUNTER — Encounter (HOSPITAL_COMMUNITY): Payer: Self-pay

## 2021-08-29 DIAGNOSIS — Z7984 Long term (current) use of oral hypoglycemic drugs: Secondary | ICD-10-CM | POA: Diagnosis not present

## 2021-08-29 DIAGNOSIS — I10 Essential (primary) hypertension: Secondary | ICD-10-CM

## 2021-08-29 DIAGNOSIS — G894 Chronic pain syndrome: Secondary | ICD-10-CM | POA: Diagnosis present

## 2021-08-29 DIAGNOSIS — Z7982 Long term (current) use of aspirin: Secondary | ICD-10-CM | POA: Diagnosis not present

## 2021-08-29 DIAGNOSIS — M542 Cervicalgia: Secondary | ICD-10-CM | POA: Insufficient documentation

## 2021-08-29 LAB — CBG MONITORING, ED: Glucose-Capillary: 77 mg/dL (ref 70–99)

## 2021-08-29 MED ORDER — HYDRALAZINE HCL 20 MG/ML IJ SOLN
10.0000 mg | Freq: Once | INTRAMUSCULAR | Status: AC
  Administered 2021-08-29: 10 mg via INTRAMUSCULAR
  Filled 2021-08-29: qty 1

## 2021-08-29 MED ORDER — LIDOCAINE 5 % EX PTCH: 1.0000 | MEDICATED_PATCH | Freq: Every day | CUTANEOUS | 0 refills | Status: DC | PRN
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
## 2021-08-29 NOTE — ED Notes (Signed)
Pt states she is having blurred vision. MD made aware and made aware of BP.

## 2021-08-29 NOTE — Discharge Instructions (Addendum)
Follow up with your primary care doctor.

## 2021-08-29 NOTE — ED Provider Triage Note (Signed)
Emergency Medicine Provider Triage Evaluation Note  [name] , a 76 y.o. female  was evaluated in triage.  Pt complains of ongoing pain and joint pain after a fall that occurred 1 month ago, although patient states that it happened 1 week ago.  She has been seen multiple times in the emergency department for ongoing pain and discomfort.  She is complaining mostly of pain in the left shoulder today although she states that "all of her joints hurt".  Tylenol is not helping.  Denies numbness and tingling.  No new falls or injury.  Review of Systems  Positive:  Negative:   Physical Exam  BP (!) 166/69 (BP Location: Right Arm)   Pulse 66   Temp 98.6 F (37 C)   Resp 16   SpO2 99%  Gen:   Awake, no distress   Resp:  Normal effort  MSK:   Moves extremities without difficulty; tenderness to palpation of the left shoulder.  Not willing to engage in range of motion. Other:    Medical Decision Making  Medically screening exam initiated at 1:07 PM.  Appropriate orders placed.  Melia Hopes was informed that the remainder of the evaluation will be completed by another provider, this initial triage assessment does not replace that evaluation, and the importance of remaining in the ED until their evaluation is complete.     Janell Quiet, [place] 08/29/21 1309

## 2021-08-31 ENCOUNTER — Other Ambulatory Visit: Payer: Self-pay

## 2021-08-31 ENCOUNTER — Encounter (HOSPITAL_COMMUNITY): Payer: Self-pay

## 2021-08-31 ENCOUNTER — Emergency Department (HOSPITAL_COMMUNITY)
Admission: EM | Admit: 2021-08-31 | Discharge: 2021-08-31 | Disposition: A | Discharge status: Home / Self Care | Payer: Medicare Other | Attending: Emergency Medicine | Admitting: Emergency Medicine

## 2021-08-31 ENCOUNTER — Ambulatory Visit (HOSPITAL_COMMUNITY)
Admission: EM | Admit: 2021-08-31 | Discharge: 2021-08-31 | Disposition: A | Discharge status: Home / Self Care | Payer: Medicare Other | Attending: Internal Medicine | Admitting: Internal Medicine

## 2021-08-31 DIAGNOSIS — I10 Essential (primary) hypertension: Secondary | ICD-10-CM | POA: Diagnosis not present

## 2021-08-31 DIAGNOSIS — Z7982 Long term (current) use of aspirin: Secondary | ICD-10-CM | POA: Diagnosis not present

## 2021-08-31 DIAGNOSIS — R519 Headache, unspecified: Secondary | ICD-10-CM | POA: Diagnosis present

## 2021-08-31 MED ORDER — CLONIDINE HCL 0.1 MG PO TABS
0.1000 mg | ORAL_TABLET | Freq: Once | ORAL | Status: AC
  Administered 2021-08-31: 0.1 mg via ORAL
  Filled 2021-08-31: qty 1

## 2021-08-31 MED ORDER — ACETAMINOPHEN 325 MG PO TABS
650.0000 mg | ORAL_TABLET | Freq: Once | ORAL | Status: AC
  Administered 2021-08-31: 650 mg via ORAL
  Filled 2021-08-31: qty 2

## 2021-08-31 MED ORDER — CHLORTHALIDONE 25 MG PO TABS
25.0000 mg | ORAL_TABLET | Freq: Once | ORAL | Status: AC
  Administered 2021-08-31: 25 mg via ORAL
  Filled 2021-08-31: qty 1

## 2021-08-31 MED ORDER — LOSARTAN POTASSIUM 50 MG PO TABS
50.0000 mg | ORAL_TABLET | Freq: Once | ORAL | Status: AC
  Administered 2021-08-31: 50 mg via ORAL
  Filled 2021-08-31: qty 1

## 2021-08-31 MED ORDER — METOCLOPRAMIDE HCL 10 MG PO TABS
10.0000 mg | ORAL_TABLET | Freq: Once | ORAL | Status: AC
  Administered 2021-08-31: 10 mg via ORAL
  Filled 2021-08-31: qty 1

## 2021-08-31 NOTE — ED Notes (Signed)
PT states she needs food RN notified that she would have to check with the EDP before giving her food pt states "I dont want none of that foos yall have"

## 2021-08-31 NOTE — Discharge Instructions (Signed)
Take your blood pressure medicines as prescribed   See primary care doctor   Return to ER if you have worse headache, vomiting.
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
## 2021-08-31 NOTE — ED Notes (Signed)
Patient is being discharged from the Urgent Care and sent to the Emergency Department via wheelchair with RN . Per Dr Leonides Grills, patient is in need of higher level of care due to HTN with HA and shoulder pain. Patient is aware and verbalizes understanding of plan of care.  Vitals:   08/31/21 1603 08/31/21 1603  BP: (!) 217/82 (!) 220/90  Pulse:    Resp:    Temp:    SpO2:
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
## 2021-08-31 NOTE — Discharge Instructions (Signed)
Please go to the emergency department for further evaluation given your elevated blood pressure and persistent severe headache.
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
## 2021-09-04 ENCOUNTER — Emergency Department (HOSPITAL_COMMUNITY)
Admission: EM | Admit: 2021-09-04 | Discharge: 2021-09-04 | Discharge status: Against Medical Advice | Payer: Medicare Other | Attending: Emergency Medicine | Admitting: Emergency Medicine

## 2021-09-04 ENCOUNTER — Encounter (HOSPITAL_COMMUNITY): Payer: Self-pay

## 2021-09-04 ENCOUNTER — Other Ambulatory Visit: Payer: Self-pay

## 2021-09-04 ENCOUNTER — Emergency Department (HOSPITAL_COMMUNITY)
Admission: EM | Admit: 2021-09-04 | Discharge: 2021-09-05 | Disposition: A | Discharge status: Home / Self Care | Payer: Medicare Other | Source: Home / Self Care | Attending: Emergency Medicine | Admitting: Emergency Medicine

## 2021-09-04 DIAGNOSIS — Z7984 Long term (current) use of oral hypoglycemic drugs: Secondary | ICD-10-CM | POA: Insufficient documentation

## 2021-09-04 DIAGNOSIS — E11649 Type 2 diabetes mellitus with hypoglycemia without coma: Secondary | ICD-10-CM | POA: Diagnosis not present

## 2021-09-04 DIAGNOSIS — Z79899 Other long term (current) drug therapy: Secondary | ICD-10-CM | POA: Insufficient documentation

## 2021-09-04 DIAGNOSIS — I10 Essential (primary) hypertension: Secondary | ICD-10-CM | POA: Insufficient documentation

## 2021-09-04 DIAGNOSIS — Z7902 Long term (current) use of antithrombotics/antiplatelets: Secondary | ICD-10-CM | POA: Insufficient documentation

## 2021-09-04 DIAGNOSIS — R519 Headache, unspecified: Secondary | ICD-10-CM | POA: Insufficient documentation

## 2021-09-04 DIAGNOSIS — E1169 Type 2 diabetes mellitus with other specified complication: Secondary | ICD-10-CM

## 2021-09-04 DIAGNOSIS — Z5321 Procedure and treatment not carried out due to patient leaving prior to being seen by health care provider: Secondary | ICD-10-CM | POA: Insufficient documentation

## 2021-09-04 DIAGNOSIS — E162 Hypoglycemia, unspecified: Secondary | ICD-10-CM

## 2021-09-04 DIAGNOSIS — M7918 Myalgia, other site: Secondary | ICD-10-CM | POA: Insufficient documentation

## 2021-09-04 NOTE — ED Notes (Signed)
Patient called x2 for vitals recheck with no response and not physically seen in lobby

## 2021-09-04 NOTE — ED Triage Notes (Signed)
Pt reports headache and all over body pain
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
## 2021-09-05 ENCOUNTER — Emergency Department (HOSPITAL_COMMUNITY)
Admission: EM | Admit: 2021-09-05 | Discharge: 2021-09-05 | Disposition: A | Discharge status: Home / Self Care | Payer: Medicare Other | Attending: Emergency Medicine | Admitting: Emergency Medicine

## 2021-09-05 ENCOUNTER — Encounter (HOSPITAL_COMMUNITY): Payer: Self-pay | Admitting: Emergency Medicine

## 2021-09-05 ENCOUNTER — Other Ambulatory Visit: Payer: Self-pay

## 2021-09-05 ENCOUNTER — Emergency Department (HOSPITAL_COMMUNITY): Payer: Medicare Other

## 2021-09-05 ENCOUNTER — Encounter (HOSPITAL_COMMUNITY): Payer: Self-pay

## 2021-09-05 DIAGNOSIS — I10 Essential (primary) hypertension: Secondary | ICD-10-CM | POA: Insufficient documentation

## 2021-09-05 DIAGNOSIS — R519 Headache, unspecified: Secondary | ICD-10-CM

## 2021-09-05 DIAGNOSIS — Z79899 Other long term (current) drug therapy: Secondary | ICD-10-CM | POA: Diagnosis not present

## 2021-09-05 LAB — BASIC METABOLIC PANEL
Anion gap: 10 (ref 5–15)
BUN: 16 mg/dL (ref 8–23)
CO2: 28 mmol/L (ref 22–32)
Calcium: 8.9 mg/dL (ref 8.9–10.3)
Chloride: 105 mmol/L (ref 98–111)
Creatinine, Ser: 0.93 mg/dL (ref 0.44–1.00)
GFR, Estimated: >60 mL/min (ref >60)
Glucose, Bld: 40 mg/dL — CL (ref 70–99)
Potassium: 3.5 mmol/L (ref 3.5–5.1)
Sodium: 143 mmol/L (ref 135–145)

## 2021-09-05 LAB — CBC
HCT: 34.2 % — ABNORMAL LOW (ref 36.0–46.0)
Hemoglobin: 10.7 g/dL — ABNORMAL LOW (ref 12.0–15.0)
MCH: 29.3 pg (ref 26.0–34.0)
MCHC: 31.3 g/dL (ref 30.0–36.0)
MCV: 93.7 fL (ref 80.0–100.0)
Platelets: 284 10*3/uL (ref 150–400)
RBC: 3.65 MIL/uL — ABNORMAL LOW (ref 3.87–5.11)
RDW: 13.8 % (ref 11.5–15.5)
WBC: 9.5 10*3/uL (ref 4.0–10.5)
nRBC: 0 % (ref 0.0–0.2)

## 2021-09-05 LAB — CBG MONITORING, ED
Glucose-Capillary: 106 mg/dL — ABNORMAL HIGH (ref 70–99)
Glucose-Capillary: 120 mg/dL — ABNORMAL HIGH (ref 70–99)
Glucose-Capillary: 131 mg/dL — ABNORMAL HIGH (ref 70–99)
Glucose-Capillary: 189 mg/dL — ABNORMAL HIGH (ref 70–99)
Glucose-Capillary: 35 mg/dL — CL (ref 70–99)
Glucose-Capillary: 79 mg/dL (ref 70–99)

## 2021-09-05 MED ORDER — LIDOCAINE 5 % EX PTCH
1.0000 | MEDICATED_PATCH | CUTANEOUS | Status: DC
  Administered 2021-09-05: 1 via TRANSDERMAL
  Filled 2021-09-05 (×2): qty 1

## 2021-09-05 MED ORDER — DIPHENHYDRAMINE HCL 50 MG/ML IJ SOLN
12.5000 mg | Freq: Once | INTRAMUSCULAR | Status: AC
  Administered 2021-09-05: 12.5 mg via INTRAVENOUS
  Filled 2021-09-05: qty 1

## 2021-09-05 MED ORDER — METOCLOPRAMIDE HCL 5 MG/ML IJ SOLN
10.0000 mg | Freq: Once | INTRAMUSCULAR | Status: AC
  Administered 2021-09-05: 10 mg via INTRAVENOUS
  Filled 2021-09-05: qty 2

## 2021-09-05 MED ORDER — DEXTROSE 50 % IV SOLN
1.0000 | Freq: Once | INTRAVENOUS | Status: AC
  Administered 2021-09-05: 50 mL via INTRAVENOUS
  Filled 2021-09-05: qty 50

## 2021-09-05 MED ORDER — METFORMIN HCL 500 MG PO TABS: 500.0000 mg | ORAL_TABLET | Freq: Two times a day (BID) | ORAL | 3 refills | Status: DC

## 2021-09-05 MED ORDER — ACETAMINOPHEN 325 MG PO TABS
650.0000 mg | ORAL_TABLET | Freq: Once | ORAL | Status: AC
  Administered 2021-09-05: 650 mg via ORAL
  Filled 2021-09-05: qty 2

## 2021-09-05 MED ORDER — CLONIDINE HCL 0.2 MG PO TABS
0.2000 mg | ORAL_TABLET | Freq: Once | ORAL | Status: AC
  Administered 2021-09-05: 0.2 mg via ORAL
  Filled 2021-09-05: qty 1

## 2021-09-05 MED ORDER — LIDOCAINE 5 % EX PTCH
1.0000 | MEDICATED_PATCH | CUTANEOUS | Status: DC
  Administered 2021-09-05: 1 via TRANSDERMAL
  Filled 2021-09-05: qty 1

## 2021-09-05 MED ORDER — LACTATED RINGERS IV BOLUS
500.0000 mL | Freq: Once | INTRAVENOUS | Status: AC
  Administered 2021-09-05: 500 mL via INTRAVENOUS

## 2021-09-05 NOTE — ED Notes (Signed)
This RN did not receive report for this pt
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
## 2021-09-05 NOTE — ED Notes (Signed)
PA notified about BP

## 2021-09-05 NOTE — ED Notes (Signed)
Discharge paperwork explained and patient provided bus pass. Patient states I cant walk to bus stop. Patient denies having any family to come get her. When asked how she eloped from [place] earlier how did she get home pt denies going to [place]. Asked pt how she gets around home if she lives alone, pt states she doesn't, I proceeded to ask patient how she gets to restroom and gets groceries then. Patient states my daughter gets my groceries. Expressed to patient that we can call her daughter to come get her then, pt states "I lied she doesn't get my groceries for me" explained to patient that [facility] will not get patient due to being able to walk and get around independently. Pt provided bus pass and offered to call daughter and patient declined.

## 2021-09-05 NOTE — Discharge Instructions (Addendum)
It was a pleasure caring for you today in the emergency department. ° °Please return to the emergency department for any worsening or worrisome symptoms. ° ° °

## 2021-09-05 NOTE — Discharge Instructions (Addendum)
Please follow-up with your primary physician to discuss outpatient management of your high blood pressure.  Referral to neurology has been placed for management of your chronic headaches.

## 2021-09-05 NOTE — ED Provider Triage Note (Signed)
Emergency Medicine Provider Triage Evaluation Note  [name] , a 76 y.o. female  was evaluated in triage.  Pt complains of headache since June.  Has also had some blurred vision.  Says that she has not seen her doctor for this.  Also does not understand why she has not been given any pain medication.  Reports history of hypertension and that she has been taking her medications.  Review of Systems  Positive: Headache Negative:   Physical Exam  BP (!) 192/72   Pulse 71   Temp 98.7 F (37.1 C) (Oral)   Resp 14   SpO2 99%  Gen:   Awake, no distress   Resp:  Normal effort  MSK:   Moves extremities without difficulty  Other:  Moving all extremities.  Cranial nerves II through XII grossly intact.  Medical Decision Making  Medically screening exam initiated at 11:22 AM.  Appropriate orders placed.  [name] was informed that the remainder of the evaluation will be completed by another provider, this initial triage assessment does not replace that evaluation, and the importance of remaining in the ED until their evaluation is complete.    Patient has been seen multiple times for the same complaint.  It is a chronic problem.  She says has been going on since June which is 2 months.  She was discharged from [facility] this morning.  We discussed discharge from triage but she says that she would rather wait and then to have to come back.  She understands that there is not much more that will likely be done because she has been seen for this multiple times.   Saddie Benders, PA-C 09/05/21 1125
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
## 2021-09-05 NOTE — ED Triage Notes (Signed)
BIBA for headache, blurred vision, and hypoglycemia.  Hx NIDDM Cbg-45 on ems arrival admin glucose and 79 on arrival to ER. Pt reports no cbg machine at home.

## 2021-09-05 NOTE — ED Provider Notes (Addendum)
[facility]-EMERGENCY DEPT Provider Note   CSN: [id number] Arrival date & time: 09/04/21  2350     History  Chief Complaint  Patient presents with   Hypoglycemia    [name] is a 76 y.o. female.  Patient as above with significant medical history as below, including DM, HTN, GERD who presents to the ED with complaint of glycemia, body aches.  Patient initially arrived to [facility], ER but left secondary to prolonged wait time. She arrived here with complaint as above, in addition to fatigue, headache, tenderness globally.  Symptoms have been variable over the past few months.  She has history of diabetes but does not check her sugar at home.  Multiple ED visits in the past 6 months.  No nausea, vomiting, fevers or chills per no chest pain or dyspnea.  No abdominal pain.  No change in bowel or bladder function.  Reports she is compliant with her medications.  Does not regularly follow with PCP.  Well-known to the emergency department.  POC glucose on arrival was low.     Past Medical History:  Diagnosis Date   Diabetes mellitus without complication (HCC)    GERD 09/04/2006   Qualifier: Diagnosis of  By: Duke Salvia     History of echocardiogram    a. 2D ECHO: 11/06/2013 EF 65%; no WMA. Mild TR. PA pk pressure 43 mm HG   Hypertension    Stroke [facility])     Past Surgical History:  Procedure Laterality Date   ABDOMINAL HYSTERECTOMY     ANTERIOR CERVICAL DECOMPRESSION/DISCECTOMY FUSION 4 LEVELS N/A 11/11/2019   Procedure: Cervical three-four Cervical four-five Cervical five-six Cervical six-seven  Anterior cervical decompression/discectomy/fusion;  Surgeon: Maeola Harman, MD;  Location: [facility] OR;  Service: Neurosurgery;  Laterality: N/A;   LEFT HEART CATHETERIZATION WITH CORONARY ANGIOGRAM N/A 11/05/2013   Procedure: LEFT HEART CATHETERIZATION WITH CORONARY ANGIOGRAM;  Surgeon: Lennette Bihari, MD;  Location: [facility] CATH LAB;  Service: Cardiovascular;  Laterality: N/A;      The history is provided by the patient. No language interpreter was used.  Hypoglycemia Associated symptoms: no shortness of breath        Home Medications Prior to Admission medications   Medication Sig Start Date End Date Taking? Authorizing Provider  chlorthalidone (HYGROTON) 25 MG tablet Take 1 tablet (25 mg total) by mouth daily. 10/05/20 10/05/21 Yes Barbette Merino, NP  clopidogrel (PLAVIX) 75 MG tablet Take 1 tablet (75 mg total) by mouth daily. 06/15/21  Yes Christian, Rylee, MD  gabapentin (NEURONTIN) 300 MG capsule Take 1 capsule (300 mg total) by mouth 3 (three) times daily. Patient taking differently: Take 300 mg by mouth daily. 10/05/20  Yes King, Shana Chute, NP  lidocaine (LIDODERM) 5 % Place 1 patch onto the skin daily as needed. Apply patch to area most significant pain once per day.  Remove and discard patch within 12 hours of application. 08/29/21  Yes Benjiman Core, MD  losartan (COZAAR) 50 MG tablet Take 1 tablet (50 mg total) by mouth daily. 06/15/21 06/15/22 Yes Christian, Rylee, MD  rosuvastatin (CRESTOR) 10 MG tablet Take 1 tablet (10 mg total) by mouth at bedtime. 06/15/21 06/15/22 Yes Christian, Rylee, MD  albuterol (VENTOLIN HFA) 108 (90 Base) MCG/ACT inhaler Inhale 1-2 puffs into the lungs every 6 (six) hours as needed for wheezing or shortness of breath. Patient not taking: Reported on 09/05/2021 04/22/20   Barbette Merino, NP  diclofenac Sodium (VOLTAREN) 1 % GEL Apply 2 g  topically 4 (four) times daily as needed. Patient not taking: Reported on 09/05/2021 07/05/21   Nira Conn, MD  metFORMIN (GLUCOPHAGE) 500 MG tablet Take 1 tablet (500 mg total) by mouth 2 (two) times daily with a meal. 09/05/21 09/05/22  [name] A, DO      Allergies    Codeine, Ibuprofen, and Imdur [isosorbide nitrate]    Review of Systems   Review of Systems  Constitutional:  Positive for fatigue. Negative for activity change and fever.  HENT:  Negative for facial swelling  and trouble swallowing.   Eyes:  Negative for discharge and redness.  Respiratory:  Negative for cough and shortness of breath.   Cardiovascular:  Negative for chest pain and palpitations.  Gastrointestinal:  Negative for abdominal pain and nausea.  Genitourinary:  Negative for dysuria and flank pain.  Musculoskeletal:  Positive for arthralgias. Negative for back pain and gait problem.  Skin:  Negative for pallor and rash.  Neurological:  Positive for light-headedness and headaches. Negative for syncope.    Physical Exam Updated Vital Signs BP (!) 183/70   Pulse 72   Temp 98.2 F (36.8 C)   Resp 15   Ht 5\' 4"  (1.626 m)   Wt 56 kg   SpO2 99%   BMI 21.19 kg/m  Physical Exam Vitals and nursing note reviewed.  Constitutional:      General: She is not in acute distress.    Appearance: Normal appearance.     Comments: frail  HENT:     Head: Normocephalic and atraumatic. No raccoon eyes, Battle's sign, right periorbital erythema or left periorbital erythema.     Right Ear: External ear normal.     Left Ear: External ear normal.     Nose: Nose normal.     Mouth/Throat:     Mouth: Mucous membranes are moist.  Eyes:     General: Vision grossly intact. Gaze aligned appropriately. No scleral icterus.       Right eye: No discharge.        Left eye: No discharge.     Extraocular Movements: Extraocular movements intact.     Pupils: Pupils are equal, round, and reactive to light.  Cardiovascular:     Rate and Rhythm: Normal rate and regular rhythm.     Pulses: Normal pulses.     Heart sounds: Normal heart sounds.  Pulmonary:     Effort: Pulmonary effort is normal. No tachypnea or respiratory distress.     Breath sounds: Normal breath sounds.  Abdominal:     General: Abdomen is flat.     Palpations: Abdomen is soft.     Tenderness: There is no abdominal tenderness. There is no guarding or rebound.  Musculoskeletal:        General: Normal range of motion.     Cervical back:  Normal range of motion.     Right lower leg: No edema.     Left lower leg: No edema.  Skin:    General: Skin is warm and dry.     Capillary Refill: Capillary refill takes less than 2 seconds.  Neurological:     General: No focal deficit present.     Mental Status: She is alert and oriented to person, place, and time.     GCS: GCS eye subscore is 4. GCS verbal subscore is 5. GCS motor subscore is 6.     Cranial Nerves: Cranial nerves 2-12 are intact.     Sensory: Sensation is intact.  Motor: Motor function is intact.     Coordination: Coordination is intact.     Gait: Gait is intact.  Psychiatric:        Mood and Affect: Mood normal.        Behavior: Behavior normal.     ED Results / Procedures / Treatments   Labs (all labs ordered are listed, but only abnormal results are displayed) Labs Reviewed  CBC - Abnormal; Notable for the following components:      Result Value   RBC 3.65 (*)    Hemoglobin 10.7 (*)    HCT 34.2 (*)    All other components within normal limits  BASIC METABOLIC PANEL - Abnormal; Notable for the following components:   Glucose, Bld 40 (*)    All other components within normal limits  CBG MONITORING, ED - Abnormal; Notable for the following components:   Glucose-Capillary 35 (*)    All other components within normal limits  CBG MONITORING, ED - Abnormal; Notable for the following components:   Glucose-Capillary 189 (*)    All other components within normal limits  CBG MONITORING, ED - Abnormal; Notable for the following components:   Glucose-Capillary 120 (*)    All other components within normal limits  CBG MONITORING, ED - Abnormal; Notable for the following components:   Glucose-Capillary 106 (*)    All other components within normal limits  CBG MONITORING, ED - Abnormal; Notable for the following components:   Glucose-Capillary 131 (*)    All other components within normal limits  URINALYSIS, ROUTINE W REFLEX MICROSCOPIC  CBG MONITORING, ED     EKG None  Radiology No results found.  Procedures .Critical Care  Performed by: Sloan Leiter, DO Authorized by: Sloan Leiter, DO   Critical care provider statement:    Critical care time (minutes):  30   Critical care time was exclusive of:  Separately billable procedures and treating other patients   Critical care was necessary to treat or prevent imminent or life-threatening deterioration of the following conditions:  Endocrine crisis   Critical care was time spent personally by me on the following activities:  Development of treatment plan with patient or surrogate, discussions with consultants, evaluation of patient's response to treatment, examination of patient, ordering and review of laboratory studies, ordering and review of radiographic studies, ordering and performing treatments and interventions, pulse oximetry, re-evaluation of patient's condition, review of old charts and obtaining history from patient or surrogate     Medications Ordered in ED Medications  lidocaine (LIDODERM) 5 % 1 patch (1 patch Transdermal Patch Applied 09/05/21 0454)  dextrose 50 % solution 50 mL (50 mLs Intravenous Given 09/05/21 0129)    ED Course/ Medical Decision Making/ A&P                           Medical Decision Making Amount and/or Complexity of Data Reviewed Labs: ordered. ECG/medicine tests: ordered.  Risk Prescription drug management.   This patient presents to the ED with chief complaint(s) of hypoglycemia, ha, arthralgias with pertinent past medical history of DM, CVA which further complicates the presenting complaint. The complaint involves an extensive differential diagnosis and also carries with it a high risk of complications and morbidity.    The differential diagnosis includes but not limited to hypoglycemia, electrolyte derangement, endocrine crisis, metabolic crisis, infectious, intracranial,. Serious etiologies were considered.   The initial plan is to check  glucose, screening labs  Additional history obtained: Additional history obtained from  n/a Records reviewed  prior ED visits, prior labs and imaging, home medications  Independent labs interpretation:  The following labs were independently interpreted: CBG on arrival was low.  Metabolic panel also has hypoglycemia.  Patient was given D50, given food and drink.  Independent visualization of imaging:  Cardiac monitoring was reviewed and interpreted by myself which shows NSR  Treatment and Reassessment: Given D50, multiple food items. Is able to eat nearly an entire meal in the emergency room without difficulty.  Repeat glucose greatly improved.  Other symptoms have essentially resolved including her headache, fatigue, malaise sensation.  Consultation: - Consulted or discussed management/test interpretation w/ external professional: n/a  Consideration for admission or further workup: Admission was considered   Presentation today likely secondary to poor p.o. intake in setting of diabetes and metformin use.  Encourage patient to check her glucose, monitor glucose at home.  Encourage dietary changes.  Encourage follow-up PCP.  >> Patient has been observed in the emergency part for over 5 hours.  Glucose has normalized.  She is able to eat a full meal without difficulty.  No nausea or vomiting.  No insulin use.  Denies overuse of her metformin; reports she is taking it as prescribed.  Will reduce metformin to 500 mg twice daily.  Recheck with PCP in the next 2 to 3 days.    She is ambulatory with a steady gait.  Neurologically intact.  Stable for discharge. Close PCP f/u strongly encouraged.   The patient improved significantly and was discharged in stable condition. Detailed discussions were had with the patient regarding current findings, and need for close f/u with PCP or on call doctor. The patient has been instructed to return immediately if the symptoms worsen in any way for  re-evaluation. Patient verbalized understanding and is in agreement with current care plan. All questions answered prior to discharge.    Social Determinants of health: Social History   Tobacco Use   Smoking status: Never   Smokeless tobacco: Never  Vaping Use   Vaping Use: Never used  Substance Use Topics   Alcohol use: No   Drug use: No            Final Clinical Impression(s) / ED Diagnoses Final diagnoses:  Hypoglycemia    Rx / DC Orders ED Discharge Orders          Ordered    metFORMIN (GLUCOPHAGE) 500 MG tablet  2 times daily with meals        09/05/21 0510              Sloan Leiter, DO 09/05/21 0509    Sloan Leiter, DO 09/05/21 0510

## 2021-09-05 NOTE — ED Notes (Signed)
MD aware of cbg
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
## 2021-09-05 NOTE — ED Notes (Signed)
DC instructions reviewed with pt. PT verbalized understanding. PT DC °

## 2021-09-07 ENCOUNTER — Emergency Department (HOSPITAL_COMMUNITY): Payer: Medicare Other

## 2021-09-07 ENCOUNTER — Emergency Department (HOSPITAL_COMMUNITY)
Admission: EM | Admit: 2021-09-07 | Discharge: 2021-09-08 | Disposition: A | Discharge status: Home / Self Care | Payer: Medicare Other | Attending: Emergency Medicine | Admitting: Emergency Medicine

## 2021-09-07 ENCOUNTER — Encounter (HOSPITAL_COMMUNITY): Payer: Self-pay | Admitting: Emergency Medicine

## 2021-09-07 ENCOUNTER — Other Ambulatory Visit: Payer: Self-pay

## 2021-09-07 DIAGNOSIS — I10 Essential (primary) hypertension: Secondary | ICD-10-CM | POA: Diagnosis not present

## 2021-09-07 DIAGNOSIS — M545 Low back pain, unspecified: Secondary | ICD-10-CM | POA: Insufficient documentation

## 2021-09-07 DIAGNOSIS — M542 Cervicalgia: Secondary | ICD-10-CM | POA: Diagnosis not present

## 2021-09-07 DIAGNOSIS — W19XXXA Unspecified fall, initial encounter: Secondary | ICD-10-CM | POA: Diagnosis not present

## 2021-09-07 LAB — CBC WITH DIFFERENTIAL/PLATELET
Abs Immature Granulocytes: 0.02 10*3/uL (ref 0.00–0.07)
Basophils Absolute: 0 10*3/uL (ref 0.0–0.1)
Basophils Relative: 1 %
Eosinophils Absolute: 0.1 10*3/uL (ref 0.0–0.5)
Eosinophils Relative: 1 %
HCT: 31.6 % — ABNORMAL LOW (ref 36.0–46.0)
Hemoglobin: 10.4 g/dL — ABNORMAL LOW (ref 12.0–15.0)
Immature Granulocytes: 0 %
Lymphocytes Relative: 20 %
Lymphs Abs: 1.3 10*3/uL (ref 0.7–4.0)
MCH: 30.2 pg (ref 26.0–34.0)
MCHC: 32.9 g/dL (ref 30.0–36.0)
MCV: 91.9 fL (ref 80.0–100.0)
Monocytes Absolute: 0.5 10*3/uL (ref 0.1–1.0)
Monocytes Relative: 8 %
Neutro Abs: 4.6 10*3/uL (ref 1.7–7.7)
Neutrophils Relative %: 70 %
Platelets: 276 10*3/uL (ref 150–400)
RBC: 3.44 MIL/uL — ABNORMAL LOW (ref 3.87–5.11)
RDW: 13.2 % (ref 11.5–15.5)
WBC: 6.5 10*3/uL (ref 4.0–10.5)
nRBC: 0 % (ref 0.0–0.2)

## 2021-09-07 LAB — COMPREHENSIVE METABOLIC PANEL
ALT: 11 U/L (ref 0–44)
AST: 19 U/L (ref 15–41)
Albumin: 3.5 g/dL (ref 3.5–5.0)
Alkaline Phosphatase: 73 U/L (ref 38–126)
Anion gap: 9 (ref 5–15)
BUN: 11 mg/dL (ref 8–23)
CO2: 28 mmol/L (ref 22–32)
Calcium: 8.8 mg/dL — ABNORMAL LOW (ref 8.9–10.3)
Chloride: 102 mmol/L (ref 98–111)
Creatinine, Ser: 0.99 mg/dL (ref 0.44–1.00)
GFR, Estimated: 60 mL/min — ABNORMAL LOW (ref >60)
Glucose, Bld: 244 mg/dL — ABNORMAL HIGH (ref 70–99)
Potassium: 3.8 mmol/L (ref 3.5–5.1)
Sodium: 139 mmol/L (ref 135–145)
Total Bilirubin: 0.6 mg/dL (ref 0.3–1.2)
Total Protein: 7.5 g/dL (ref 6.5–8.1)

## 2021-09-07 NOTE — ED Triage Notes (Signed)
Patient complaining of neck and left shoulder. Patient with [place] the [place].

## 2021-09-07 NOTE — ED Provider Triage Note (Signed)
Emergency Medicine Provider Triage Evaluation Note  [name] , a 76 y.o. female  was evaluated in triage.  Pt complains of left shoulder pain after [place] the city [place].  Review of Systems  Positive:  Negative:   Physical Exam  BP (!) 224/76   Pulse 72   Temp 98.3 F (36.8 C) (Oral)   Resp 18   SpO2 99%  Gen:   Awake, no distress   Resp:  Normal effort  MSK:   Moves extremities without difficulty  Other:  Moving extremity. Diffuse tenderness. Compartments soft. Pulses intact.   Medical Decision Making  Medically screening exam initiated at 7:07 PM.  Appropriate orders placed.  [name] was informed that the remainder of the evaluation will be completed by another provider, this initial triage assessment does not replace that evaluation, and the importance of remaining in the ED until their evaluation is complete.  Extremely hypertensive. Ordering basic labs and XR imaging. Patient has multiple visits.    [name], [place] 09/07/21 0037

## 2021-09-08 DIAGNOSIS — M545 Low back pain, unspecified: Secondary | ICD-10-CM | POA: Diagnosis not present

## 2021-09-08 MED ORDER — LOSARTAN POTASSIUM 100 MG PO TABS: 100.0000 mg | ORAL_TABLET | Freq: Every day | ORAL | 0 refills | Status: DC

## 2021-09-08 MED ORDER — IBUPROFEN 400 MG PO TABS
400.0000 mg | ORAL_TABLET | Freq: Once | ORAL | Status: AC
  Administered 2021-09-08: 400 mg via ORAL
  Filled 2021-09-08: qty 1

## 2021-09-08 MED ORDER — LOSARTAN POTASSIUM 50 MG PO TABS: 50.0000 mg | ORAL_TABLET | Freq: Every day | ORAL | Status: DC

## 2021-09-08 MED ORDER — LOSARTAN POTASSIUM 50 MG PO TABS
100.0000 mg | ORAL_TABLET | Freq: Once | ORAL | Status: AC
  Administered 2021-09-08: 100 mg via ORAL
  Filled 2021-09-08: qty 2

## 2021-09-08 MED ORDER — ACETAMINOPHEN 500 MG PO TABS
1000.0000 mg | ORAL_TABLET | Freq: Once | ORAL | Status: AC
  Administered 2021-09-08: 1000 mg via ORAL
  Filled 2021-09-08: qty 2
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
## 2021-09-08 NOTE — Discharge Instructions (Addendum)
You were seen today for a fall from last month.  You do not have any fractures and labs look reassuring.  You have had multiple CT scans that also did not demonstrate any fractures or intracranial injury.  It is critical that you follow-up with a primary care provider regarding your blood pressure within 48 hours.  I have attached a phone number for you to call today to achieve this follow-up. Additionally, I am represcribing your losartan at 100 mg to initiate care for your hypertension.  It is critical that you get repeat labs drawn to continue checking your potassium and other electrolytes within the next 48 hours as well.

## 2021-09-17 ENCOUNTER — Emergency Department (HOSPITAL_COMMUNITY)
Admission: EM | Admit: 2021-09-17 | Discharge: 2021-09-17 | Disposition: A | Discharge status: Home / Self Care | Payer: Medicare Other | Attending: Emergency Medicine | Admitting: Emergency Medicine

## 2021-09-17 ENCOUNTER — Other Ambulatory Visit: Payer: Self-pay

## 2021-09-17 DIAGNOSIS — M25512 Pain in left shoulder: Secondary | ICD-10-CM | POA: Diagnosis not present

## 2021-09-17 DIAGNOSIS — Z79899 Other long term (current) drug therapy: Secondary | ICD-10-CM | POA: Diagnosis not present

## 2021-09-17 DIAGNOSIS — G8929 Other chronic pain: Secondary | ICD-10-CM

## 2021-09-17 DIAGNOSIS — I10 Essential (primary) hypertension: Secondary | ICD-10-CM | POA: Diagnosis not present

## 2021-09-17 DIAGNOSIS — Z7984 Long term (current) use of oral hypoglycemic drugs: Secondary | ICD-10-CM | POA: Diagnosis not present

## 2021-09-17 DIAGNOSIS — E119 Type 2 diabetes mellitus without complications: Secondary | ICD-10-CM | POA: Diagnosis not present

## 2021-09-17 DIAGNOSIS — R519 Headache, unspecified: Secondary | ICD-10-CM | POA: Diagnosis present

## 2021-09-17 MED ORDER — LOSARTAN POTASSIUM 50 MG PO TABS: 100.0000 mg | ORAL_TABLET | Freq: Once | ORAL | Status: DC

## 2021-09-17 MED ORDER — ACETAMINOPHEN 500 MG PO TABS
1000.0000 mg | ORAL_TABLET | Freq: Once | ORAL | Status: AC
  Administered 2021-09-17: 1000 mg via ORAL
  Filled 2021-09-17: qty 2

## 2021-09-17 MED ORDER — LOSARTAN POTASSIUM 50 MG PO TABS
50.0000 mg | ORAL_TABLET | Freq: Once | ORAL | Status: AC
  Administered 2021-09-17: 50 mg via ORAL
  Filled 2021-09-17: qty 1
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
## 2021-09-17 NOTE — ED Triage Notes (Signed)
Pt here c/o HA due to htn. Pt states she checked her blood pressure at home and it was 199 systolic.

## 2021-09-17 NOTE — ED Provider Triage Note (Signed)
Emergency Medicine Provider Triage Evaluation Note  [name] , a 76 y.o. female  was evaluated in triage.  Pt complains of shoulder and neck pain. States she was in a fall a few weeks ago. She was evaluated in the ED with no acute injuries. States since then she has had right shoulder and neck pain. States she got here and her blood pressure was high and was concerned about that too.  Review of Systems  Positive: See above Negative:   Physical Exam  BP (!) 221/86 (BP Location: Right Arm)   Pulse 72   Temp 97.8 F (36.6 C) (Oral)   Resp 18   SpO2 100%  Gen:   Awake, no distress   Resp:  Normal effort  MSK:   Moves extremities without difficulty  Other:    Medical Decision Making  Medically screening exam initiated at 7:20 PM.  Appropriate orders placed.  [name] was informed that the remainder of the evaluation will be completed by another provider, this initial triage assessment does not replace that evaluation, and the importance of remaining in the ED until their evaluation is complete.     Cristopher Peru, PA-C 09/17/21 1921

## 2021-09-17 NOTE — Discharge Instructions (Addendum)
You were seen in the emergency room today for your chronic headache and left shoulder pain.  Your blood pressure is also quite high as you would mention.  We gave you a blood pressure medication from home and a medicine for your pain.  We think you are safe to go home at this time.  You must follow-up with your primary care doctor to discuss your blood pressure goals moving forward as you likely require a medication adjustment moving forward.  If your headache suddenly worsens, you are unable to keep anything down by mouth or if any new symptom please come back to the emergency room and otherwise follow-up with your primary care doctor as discussed.

## 2021-09-24 ENCOUNTER — Encounter (HOSPITAL_COMMUNITY): Payer: Self-pay | Admitting: Emergency Medicine

## 2021-09-24 ENCOUNTER — Other Ambulatory Visit: Payer: Self-pay

## 2021-09-24 ENCOUNTER — Emergency Department (HOSPITAL_COMMUNITY)
Admission: EM | Admit: 2021-09-24 | Discharge: 2021-09-24 | Disposition: A | Discharge status: Home / Self Care | Payer: Medicare Other | Attending: Emergency Medicine | Admitting: Emergency Medicine

## 2021-09-24 DIAGNOSIS — M542 Cervicalgia: Secondary | ICD-10-CM | POA: Diagnosis not present

## 2021-09-24 DIAGNOSIS — Z7984 Long term (current) use of oral hypoglycemic drugs: Secondary | ICD-10-CM | POA: Insufficient documentation

## 2021-09-24 DIAGNOSIS — I10 Essential (primary) hypertension: Secondary | ICD-10-CM | POA: Diagnosis not present

## 2021-09-24 DIAGNOSIS — Z79899 Other long term (current) drug therapy: Secondary | ICD-10-CM | POA: Insufficient documentation

## 2021-09-24 DIAGNOSIS — M25512 Pain in left shoulder: Secondary | ICD-10-CM | POA: Diagnosis not present

## 2021-09-24 DIAGNOSIS — E119 Type 2 diabetes mellitus without complications: Secondary | ICD-10-CM | POA: Insufficient documentation

## 2021-09-24 DIAGNOSIS — Z7902 Long term (current) use of antithrombotics/antiplatelets: Secondary | ICD-10-CM | POA: Diagnosis not present

## 2021-09-24 MED ORDER — ACETAMINOPHEN 500 MG PO TABS
1000.0000 mg | ORAL_TABLET | Freq: Once | ORAL | Status: AC
  Administered 2021-09-24: 1000 mg via ORAL
  Filled 2021-09-24: qty 2

## 2021-09-24 MED ORDER — AMLODIPINE BESYLATE 5 MG PO TABS
5.0000 mg | ORAL_TABLET | Freq: Once | ORAL | Status: AC
  Administered 2021-09-24: 5 mg via ORAL
  Filled 2021-09-24: qty 1
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
## 2021-09-24 NOTE — Discharge Instructions (Signed)
Increase your losartan to the 100 mg tablets daily since your blood pressure remains elevated.  Follow-up with your primary care doctor to check on your blood pressure.  Consider seeing an orthopedic or spine doctor regarding your persistent neck and shoulder pain

## 2021-09-24 NOTE — ED Provider Notes (Signed)
[facility] EMERGENCY DEPARTMENT Provider Note   CSN: [id number] Arrival date & time: 09/24/21  1221     History  Chief Complaint  Patient presents with   [name]    [name] is a 76 y.o. female.   Fall     Patient presents to the ED with complaints of persistent pain in her neck and shoulder as well as her head ever since a fall several weeks ago.  Patient does have a history of hypertension, diabetes, stroke and frequent visits to the ED.  She has been seen 38 times in the last 6 months.  Patient has been seen multiple times for this pain.  She did have a fall back in July.  Patient has had number of x-rays including shoulder x-rays on August 8 15th and 17th.  She also had a CT scan of her head on August 1.  She has had CT scans of her head and C-spine back on July 12.  Patient states she has been wearing a cervical Spine collar for comfort.  Her previous x-rays did not show any signs of acute fracture or or subluxation.  She does have history of prior neck surgery.  Patient states her neck continues to hurt her.  When her pain acts up her blood pressure starts elevating as well.  She states she has been taking her medications.  No acute numbness.  No chest pain or shortness of breath  Home Medications Prior to Admission medications   Medication Sig Start Date End Date Taking? Authorizing Provider  albuterol (VENTOLIN HFA) 108 (90 Base) MCG/ACT inhaler Inhale 1-2 puffs into the lungs every 6 (six) hours as needed for wheezing or shortness of breath. Patient not taking: Reported on 09/05/2021 04/22/20   Barbette Merino, NP  chlorthalidone (HYGROTON) 25 MG tablet Take 1 tablet (25 mg total) by mouth daily. 10/05/20 10/05/21  Barbette Merino, NP  clopidogrel (PLAVIX) 75 MG tablet Take 1 tablet (75 mg total) by mouth daily. 06/15/21   Elige Radon, MD  diclofenac Sodium (VOLTAREN) 1 % GEL Apply 2 g topically 4 (four) times daily as needed. Patient not taking: Reported on  09/05/2021 07/05/21   Nira Conn, MD  gabapentin (NEURONTIN) 300 MG capsule Take 1 capsule (300 mg total) by mouth 3 (three) times daily. Patient taking differently: Take 300 mg by mouth daily. 10/05/20   Barbette Merino, NP  lidocaine (LIDODERM) 5 % Place 1 patch onto the skin daily as needed. Apply patch to area most significant pain once per day.  Remove and discard patch within 12 hours of application. 08/29/21   Benjiman Core, MD  losartan (COZAAR) 100 MG tablet Take 1 tablet (100 mg total) by mouth daily for 14 days. 09/08/21 09/22/21  Glyn Ade, MD  losartan (COZAAR) 50 MG tablet Take 1 tablet (50 mg total) by mouth daily. 06/15/21 06/15/22  Elige Radon, MD  metFORMIN (GLUCOPHAGE) 500 MG tablet Take 1 tablet (500 mg total) by mouth 2 (two) times daily with a meal. 09/05/21 09/05/22  [name] A, DO  rosuvastatin (CRESTOR) 10 MG tablet Take 1 tablet (10 mg total) by mouth at bedtime. 06/15/21 06/15/22  Elige Radon, MD      Allergies    Codeine, Ibuprofen, and Imdur [isosorbide nitrate]    Review of Systems   Review of Systems  Physical Exam Updated Vital Signs BP (!) 196/85 (BP Location: Right Arm)   Pulse 66   Temp 98.1 F (36.7 C) (  Oral)   Resp 14   Ht 1.626 m (5\' 4" )   Wt 55.8 kg   SpO2 97%   BMI 21.11 kg/m  Physical Exam Vitals and nursing note reviewed.  Constitutional:      Appearance: She is well-developed. She is not diaphoretic.  HENT:     Head: Normocephalic and atraumatic.     Right Ear: External ear normal.     Left Ear: External ear normal.  Eyes:     General: No scleral icterus.       Right eye: No discharge.        Left eye: No discharge.     Conjunctiva/sclera: Conjunctivae normal.  Neck:     Trachea: No tracheal deviation.  Cardiovascular:     Rate and Rhythm: Normal rate.  Pulmonary:     Effort: Pulmonary effort is normal. No respiratory distress.     Breath sounds: No stridor.  Abdominal:     General: There is no  distension.  Musculoskeletal:        General: Tenderness present. No swelling or deformity.     Cervical back: Neck supple.     Comments: Tenderness palpation left shoulder, mild paraspinal tenderness left neck  Skin:    General: Skin is warm and dry.     Findings: No rash.  Neurological:     General: No focal deficit present.     Mental Status: She is alert.     Cranial Nerves: Cranial nerve deficit: no gross deficits.     ED Results / Procedures / Treatments   Labs (all labs ordered are listed, but only abnormal results are displayed) Labs Reviewed - No data to display  EKG None  Radiology No results found.  Procedures Procedures    Medications Ordered in ED Medications  amLODipine (NORVASC) tablet 5 mg (has no administration in time range)  acetaminophen (TYLENOL) tablet 1,000 mg (has no administration in time range)    ED Course/ Medical Decision Making/ A&P                           Medical Decision Making Problems Addressed: Essential hypertension: chronic illness or injury with exacerbation, progression, or side effects of treatment Pain in joint of left shoulder: chronic illness or injury  Risk OTC drugs. Prescription drug management.   Patient presented to the ED ER for evaluation of persistent headache neck pain shoulder pain.  Patient also noted to be hypertensive.  Outpatient records and previous ED visits reviewed.  Patient has had numerous visits for similar symptoms.  She has had imaging of her neck and head as well as her shoulder.  Patient is hypertensive.  I have reviewed her previous blood pressures and she is consistently in the 190s as well as up to the 200s systolic.  I will give the patient dose of Norvasc.  Stressed the importance of following up with her primary care doctor check on her blood pressure.  I think she would benefit from seeing any other an orthopedic doctor or spine doctor regarding her persistent neck and shoulder  pain.  Evaluation and diagnostic testing in the emergency department does not suggest an emergent condition requiring admission or immediate intervention beyond what has been performed at this time.  The patient is safe for discharge and has been instructed to return immediately for worsening symptoms, change in symptoms or any other concerns.         Final Clinical Impression(s) /  ED Diagnoses Final diagnoses:  Pain in joint of left shoulder  Essential hypertension    Rx / DC Orders ED Discharge Orders     None         Linwood Dibbles, MD 09/24/21 1310

## 2021-09-29 ENCOUNTER — Other Ambulatory Visit: Payer: Self-pay | Admitting: Nurse Practitioner

## 2021-10-03 ENCOUNTER — Emergency Department (HOSPITAL_COMMUNITY)
Admission: EM | Admit: 2021-10-03 | Discharge: 2021-10-03 | Disposition: A | Discharge status: Home / Self Care | Payer: Medicare Other | Attending: Emergency Medicine | Admitting: Emergency Medicine

## 2021-10-03 ENCOUNTER — Ambulatory Visit (HOSPITAL_COMMUNITY)
Admission: EM | Admit: 2021-10-03 | Discharge: 2021-10-03 | Disposition: A | Discharge status: Home / Self Care | Payer: Medicare Other | Attending: Internal Medicine | Admitting: Internal Medicine

## 2021-10-03 ENCOUNTER — Encounter (HOSPITAL_COMMUNITY): Payer: Self-pay | Admitting: Emergency Medicine

## 2021-10-03 ENCOUNTER — Encounter (HOSPITAL_COMMUNITY): Payer: Self-pay | Admitting: *Deleted

## 2021-10-03 DIAGNOSIS — M791 Myalgia, unspecified site: Secondary | ICD-10-CM | POA: Insufficient documentation

## 2021-10-03 DIAGNOSIS — W010XXA Fall on same level from slipping, tripping and stumbling without subsequent striking against object, initial encounter: Secondary | ICD-10-CM | POA: Insufficient documentation

## 2021-10-03 DIAGNOSIS — Y92811 Bus as the place of occurrence of the external cause: Secondary | ICD-10-CM | POA: Diagnosis not present

## 2021-10-03 DIAGNOSIS — G8929 Other chronic pain: Secondary | ICD-10-CM

## 2021-10-03 DIAGNOSIS — M25512 Pain in left shoulder: Secondary | ICD-10-CM

## 2021-10-03 DIAGNOSIS — M542 Cervicalgia: Secondary | ICD-10-CM | POA: Diagnosis not present

## 2021-10-03 DIAGNOSIS — R519 Headache, unspecified: Secondary | ICD-10-CM | POA: Diagnosis not present

## 2021-10-03 DIAGNOSIS — W19XXXD Unspecified fall, subsequent encounter: Secondary | ICD-10-CM | POA: Diagnosis not present

## 2021-10-03 DIAGNOSIS — M549 Dorsalgia, unspecified: Secondary | ICD-10-CM | POA: Diagnosis not present

## 2021-10-03 DIAGNOSIS — I10 Essential (primary) hypertension: Secondary | ICD-10-CM | POA: Diagnosis not present

## 2021-10-03 MED ORDER — ACETAMINOPHEN 325 MG PO TABS
ORAL_TABLET | ORAL | Status: AC
  Filled 2021-10-03: qty 3

## 2021-10-03 MED ORDER — LOSARTAN POTASSIUM 50 MG PO TABS
100.0000 mg | ORAL_TABLET | Freq: Once | ORAL | Status: AC
  Administered 2021-10-03: 100 mg via ORAL
  Filled 2021-10-03: qty 2

## 2021-10-03 MED ORDER — ACETAMINOPHEN 325 MG PO TABS
650.0000 mg | ORAL_TABLET | Freq: Once | ORAL | Status: AC
  Administered 2021-10-03: 650 mg via ORAL
  Filled 2021-10-03: qty 2

## 2021-10-03 MED ORDER — LIDOCAINE 5 % EX PTCH: 1.0000 | MEDICATED_PATCH | Freq: Every day | CUTANEOUS | 0 refills | Status: DC | PRN

## 2021-10-03 MED ORDER — AMLODIPINE BESYLATE 5 MG PO TABS: 5.0000 mg | ORAL_TABLET | Freq: Every day | ORAL | 1 refills | Status: DC

## 2021-10-03 MED ORDER — ACETAMINOPHEN 325 MG PO TABS
975.0000 mg | ORAL_TABLET | Freq: Once | ORAL | Status: AC
Start: 2021-10-03 — End: 2021-10-03
  Administered 2021-10-03: 975 mg via ORAL

## 2021-10-03 MED ORDER — IBUPROFEN 400 MG PO TABS
400.0000 mg | ORAL_TABLET | Freq: Once | ORAL | Status: AC
  Administered 2021-10-03: 400 mg via ORAL
  Filled 2021-10-03: qty 1
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
## 2021-10-03 NOTE — Discharge Instructions (Addendum)
Please a lidocaine patch anterior left shoulder for pain relief and change the patch every 24 hours.   Take amlodipine blood pressure medicine 5 mg every day to lower your blood pressure.  You may take Tylenol every 6 hours as needed for pain and discomfort.  We gave you a dose in the clinic today.  To find a primary care provider go to [email or web address] and follow the prompts to schedule a new patient appointment for primary care.  Someone from [facility] will be reaching out to you either via MyChart or phone call to help you set up a primary care provider appointment as well.  It is very important to have a primary care provider to manage your overall health and prevent/manage chronic medical conditions.   If your symptoms significantly worsen in the next 24 to 48 hours, please return to urgent care for reevaluation or go to the emergency department if your symptoms are severe.
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
## 2021-10-10 ENCOUNTER — Other Ambulatory Visit: Payer: Self-pay

## 2021-10-10 ENCOUNTER — Emergency Department (HOSPITAL_COMMUNITY)
Admission: EM | Admit: 2021-10-10 | Discharge: 2021-10-10 | Disposition: A | Discharge status: Home / Self Care | Payer: Medicare Other | Attending: Emergency Medicine | Admitting: Emergency Medicine

## 2021-10-10 ENCOUNTER — Emergency Department (HOSPITAL_COMMUNITY): Payer: Medicare Other

## 2021-10-10 DIAGNOSIS — M4802 Spinal stenosis, cervical region: Secondary | ICD-10-CM | POA: Insufficient documentation

## 2021-10-10 DIAGNOSIS — R519 Headache, unspecified: Secondary | ICD-10-CM | POA: Diagnosis not present

## 2021-10-10 DIAGNOSIS — Z79899 Other long term (current) drug therapy: Secondary | ICD-10-CM | POA: Diagnosis not present

## 2021-10-10 DIAGNOSIS — Z7984 Long term (current) use of oral hypoglycemic drugs: Secondary | ICD-10-CM | POA: Diagnosis not present

## 2021-10-10 DIAGNOSIS — E119 Type 2 diabetes mellitus without complications: Secondary | ICD-10-CM | POA: Insufficient documentation

## 2021-10-10 DIAGNOSIS — I10 Essential (primary) hypertension: Secondary | ICD-10-CM | POA: Diagnosis not present

## 2021-10-10 DIAGNOSIS — G8929 Other chronic pain: Secondary | ICD-10-CM | POA: Diagnosis not present

## 2021-10-10 DIAGNOSIS — M542 Cervicalgia: Secondary | ICD-10-CM | POA: Diagnosis present

## 2021-10-10 LAB — BASIC METABOLIC PANEL
Anion gap: 13 (ref 5–15)
BUN: 11 mg/dL (ref 8–23)
CO2: 28 mmol/L (ref 22–32)
Calcium: 9.5 mg/dL (ref 8.9–10.3)
Chloride: 97 mmol/L — ABNORMAL LOW (ref 98–111)
Creatinine, Ser: 1.05 mg/dL — ABNORMAL HIGH (ref 0.44–1.00)
GFR, Estimated: 56 mL/min — ABNORMAL LOW (ref >60)
Glucose, Bld: 105 mg/dL — ABNORMAL HIGH (ref 70–99)
Potassium: 3.8 mmol/L (ref 3.5–5.1)
Sodium: 138 mmol/L (ref 135–145)

## 2021-10-10 LAB — CBC WITH DIFFERENTIAL/PLATELET
Abs Immature Granulocytes: 0.02 10*3/uL (ref 0.00–0.07)
Basophils Absolute: 0.1 10*3/uL (ref 0.0–0.1)
Basophils Relative: 1 %
Eosinophils Absolute: 0.1 10*3/uL (ref 0.0–0.5)
Eosinophils Relative: 2 %
HCT: 37.6 % (ref 36.0–46.0)
Hemoglobin: 12.5 g/dL (ref 12.0–15.0)
Immature Granulocytes: 0 %
Lymphocytes Relative: 18 %
Lymphs Abs: 1.3 10*3/uL (ref 0.7–4.0)
MCH: 29.6 pg (ref 26.0–34.0)
MCHC: 33.2 g/dL (ref 30.0–36.0)
MCV: 89.1 fL (ref 80.0–100.0)
Monocytes Absolute: 0.5 10*3/uL (ref 0.1–1.0)
Monocytes Relative: 6 %
Neutro Abs: 5.5 10*3/uL (ref 1.7–7.7)
Neutrophils Relative %: 73 %
Platelets: 246 10*3/uL (ref 150–400)
RBC: 4.22 MIL/uL (ref 3.87–5.11)
RDW: 12.3 % (ref 11.5–15.5)
WBC: 7.5 10*3/uL (ref 4.0–10.5)
nRBC: 0 % (ref 0.0–0.2)

## 2021-10-10 MED ORDER — LIDOCAINE 5 % EX PTCH
1.0000 | MEDICATED_PATCH | CUTANEOUS | Status: DC
  Administered 2021-10-10: 1 via TRANSDERMAL
  Filled 2021-10-10: qty 1

## 2021-10-10 MED ORDER — LIDOCAINE 5 % EX PTCH: 1.0000 | MEDICATED_PATCH | Freq: Every day | CUTANEOUS | 0 refills | Status: DC | PRN

## 2021-10-10 MED ORDER — PANTOPRAZOLE SODIUM 20 MG PO TBEC: 20.0000 mg | DELAYED_RELEASE_TABLET | Freq: Every day | ORAL | 0 refills | Status: DC

## 2021-10-10 MED ORDER — ACETAMINOPHEN 325 MG PO TABS: 650.0000 mg | ORAL_TABLET | Freq: Four times a day (QID) | ORAL | Status: DC | PRN

## 2021-10-10 NOTE — ED Notes (Signed)
Discharge instructions reviewed with patient. Patient verbalized understanding of instructions. Follow-up care and medications were reviewed. Patient ambulatory with steady gait. VSS upon discharge.  ?
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
## 2021-10-10 NOTE — Discharge Instructions (Addendum)
I would recommend not wearing the neck brace.  Wearing the brace can contribute to muscle spasm and increased pain.
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
## 2021-10-24 ENCOUNTER — Emergency Department (HOSPITAL_COMMUNITY)
Admission: EM | Admit: 2021-10-24 | Discharge: 2021-10-25 | Discharge status: Against Medical Advice | Payer: Medicare Other | Attending: Emergency Medicine | Admitting: Emergency Medicine

## 2021-10-24 ENCOUNTER — Encounter (HOSPITAL_COMMUNITY): Payer: Self-pay | Admitting: Emergency Medicine

## 2021-10-24 ENCOUNTER — Other Ambulatory Visit: Payer: Self-pay

## 2021-10-24 DIAGNOSIS — R519 Headache, unspecified: Secondary | ICD-10-CM | POA: Diagnosis present

## 2021-10-24 DIAGNOSIS — Z5321 Procedure and treatment not carried out due to patient leaving prior to being seen by health care provider: Secondary | ICD-10-CM | POA: Insufficient documentation

## 2021-10-24 NOTE — ED Provider Triage Note (Signed)
Emergency Medicine Provider Triage Evaluation Note  [name] , a 76 y.o. female  was evaluated in triage.  Pt complains of headache and neck pain.  Is been going on for over a month and a half, denies any recent falls or injuries.  Complaining that her main doctor has not refilled her pain medicine for some time.  No meds prior to arrival..  Review of Systems  Per HPI  Physical Exam  There were no vitals taken for this visit. Gen:   Awake, no distress   Resp:  Normal effort  MSK:   Moves extremities without difficulty  Other:  Irritable, follows commands, cranial nerves II to XII are grossly intact.  Patient is wearing a c-collar despite negative CT imaging on the 19th, unclear why she is wearing this.  Medical Decision Making  Medically screening exam initiated at 6:31 PM.  Appropriate orders placed.  [name] was informed that the remainder of the evaluation will be completed by another provider, this initial triage assessment does not replace that evaluation, and the importance of remaining in the ED until their evaluation is complete.     Sherrill Raring, PA-C 10/24/21 1832

## 2021-10-24 NOTE — ED Triage Notes (Signed)
Patient c/o headache and states the doctor that saw her last did not prescribe her pain medicine.

## 2021-10-25 NOTE — ED Notes (Signed)
Patient seen leaving ED and getting into a cab.

## 2021-10-26 ENCOUNTER — Encounter (HOSPITAL_COMMUNITY): Payer: Self-pay | Admitting: Emergency Medicine

## 2021-10-26 ENCOUNTER — Emergency Department (HOSPITAL_COMMUNITY)
Admission: EM | Admit: 2021-10-26 | Discharge: 2021-10-27 | Disposition: A | Discharge status: Home / Self Care | Payer: Medicare Other | Attending: Student | Admitting: Student

## 2021-10-26 ENCOUNTER — Other Ambulatory Visit: Payer: Self-pay

## 2021-10-26 DIAGNOSIS — Z955 Presence of coronary angioplasty implant and graft: Secondary | ICD-10-CM | POA: Insufficient documentation

## 2021-10-26 DIAGNOSIS — M79601 Pain in right arm: Secondary | ICD-10-CM

## 2021-10-26 DIAGNOSIS — Z7984 Long term (current) use of oral hypoglycemic drugs: Secondary | ICD-10-CM | POA: Diagnosis not present

## 2021-10-26 DIAGNOSIS — I10 Essential (primary) hypertension: Secondary | ICD-10-CM | POA: Insufficient documentation

## 2021-10-26 DIAGNOSIS — M542 Cervicalgia: Secondary | ICD-10-CM | POA: Diagnosis present

## 2021-10-26 DIAGNOSIS — Z79899 Other long term (current) drug therapy: Secondary | ICD-10-CM | POA: Insufficient documentation

## 2021-10-26 DIAGNOSIS — E119 Type 2 diabetes mellitus without complications: Secondary | ICD-10-CM | POA: Insufficient documentation

## 2021-10-26 DIAGNOSIS — M25531 Pain in right wrist: Secondary | ICD-10-CM | POA: Insufficient documentation
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
## 2021-10-27 DIAGNOSIS — M542 Cervicalgia: Secondary | ICD-10-CM | POA: Diagnosis not present

## 2021-10-27 MED ORDER — GABAPENTIN 600 MG PO TABS: 300.0000 mg | ORAL_TABLET | Freq: Every day | ORAL | 0 refills | Status: DC

## 2021-10-27 MED ORDER — GABAPENTIN 300 MG PO CAPS
300.0000 mg | ORAL_CAPSULE | Freq: Once | ORAL | Status: AC
  Administered 2021-10-27: 300 mg via ORAL
  Filled 2021-10-27: qty 1

## 2021-10-27 NOTE — ED Provider Notes (Signed)
[facility] EMERGENCY DEPARTMENT Provider Note  CSN: [id number] Arrival date & time: 10/26/21 1753  Chief Complaint(s) Fall  HPI [name] is a 76 y.o. female with PMH T2DM, previous CVA, known spinal stenosis, frequent ER visits for chronic pain who presents emergency department for evaluation of neck pain and wrist pain.  Patient suffered a fall approximately 1 month ago and states that her pain has not improved since.  She has been seen more than 10 times since this fall for similar complaints.  She arrives with complaints of right wrist pain and neck pain.  Most recently seen on 10/10/2021 with CT imaging showing severe spinal stenosis but stable from previous imaging.  She has been informed multiple times that she will need to follow-up outpatient with neurosurgery.  Denies new recent fall, chest pain, shortness of breath, headache, fever, numbness, tingling or other systemic symptoms.   Past Medical History Past Medical History:  Diagnosis Date   Diabetes mellitus without complication ([place])    GERD 09/04/2006   Qualifier: Diagnosis of  By: Suzie Portela     History of echocardiogram    a. 2D ECHO: 11/06/2013 EF 65%; no WMA. Mild TR. PA pk pressure 43 mm HG   Hypertension    Stroke [facility])    Patient Active Problem List   Diagnosis Date Noted   Cognitive impairment 06/16/2021   Chronic headache 06/16/2021   Healthcare maintenance 06/16/2021   Frequent patient in emergency department 06/16/2021   Cervical stenosis of spine 11/10/2019   Type 2 diabetes mellitus with hyperlipidemia ([place]) 06/20/2012   History of stroke 06/20/2012   Essential hypertension 09/04/2006   Osteoarthritis 09/04/2006   Home Medication(s) Prior to Admission medications   Medication Sig Start Date End Date Taking? Authorizing Provider  albuterol (VENTOLIN HFA) 108 (90 Base) MCG/ACT inhaler Inhale 1-2 puffs into the lungs every 6 (six) hours as needed for wheezing or shortness of breath.  04/22/20   Vevelyn Francois, NP  amLODipine (NORVASC) 5 MG tablet Take 1 tablet (5 mg total) by mouth daily. 10/03/21   Talbot Grumbling, FNP  chlorthalidone (HYGROTON) 25 MG tablet Take 1 tablet (25 mg total) by mouth daily. 10/05/20 10/05/21  Vevelyn Francois, NP  clopidogrel (PLAVIX) 75 MG tablet Take 1 tablet (75 mg total) by mouth daily. 06/15/21   Mitzi Hansen, MD  gabapentin (NEURONTIN) 300 MG capsule Take 1 capsule (300 mg total) by mouth 3 (three) times daily. Patient taking differently: Take 300 mg by mouth daily. 10/05/20   Vevelyn Francois, NP  lidocaine (LIDODERM) 5 % Place 1 patch onto the skin daily as needed. Apply patch to area most significant pain once per day.  Remove and discard patch within 12 hours of application. 10/10/21   Isla Pence, MD  losartan (COZAAR) 100 MG tablet Take 1 tablet (100 mg total) by mouth daily for 14 days. 09/08/21 09/22/21  Tretha Sciara, MD  losartan (COZAAR) 50 MG tablet Take 1 tablet (50 mg total) by mouth daily. 06/15/21 06/15/22  Mitzi Hansen, MD  metFORMIN (GLUCOPHAGE) 500 MG tablet Take 1 tablet (500 mg total) by mouth 2 (two) times daily with a meal. 09/05/21 09/05/22  Wynona Dove A, DO  pantoprazole (PROTONIX) 20 MG tablet Take 1 tablet (20 mg total) by mouth daily. 10/10/21   Isla Pence, MD  rosuvastatin (CRESTOR) 10 MG tablet Take 1 tablet (10 mg total) by mouth at bedtime. 06/15/21 06/15/22  Mitzi Hansen, MD  Past Surgical History Past Surgical History:  Procedure Laterality Date   ABDOMINAL HYSTERECTOMY     ANTERIOR CERVICAL DECOMPRESSION/DISCECTOMY FUSION 4 LEVELS N/A 11/11/2019   Procedure: Cervical three-four Cervical four-five Cervical five-six Cervical six-seven  Anterior cervical decompression/discectomy/fusion;  Surgeon: Erline Levine, MD;  Location: [facility];  Service: Neurosurgery;  Laterality:  N/A;   LEFT HEART CATHETERIZATION WITH CORONARY ANGIOGRAM N/A 11/05/2013   Procedure: LEFT HEART CATHETERIZATION WITH CORONARY ANGIOGRAM;  Surgeon: Troy Sine, MD;  Location: [facility] CATH LAB;  Service: Cardiovascular;  Laterality: N/A;   Family History History reviewed. No pertinent family history.  Social History Social History   Tobacco Use   Smoking status: Never   Smokeless tobacco: Never  Vaping Use   Vaping Use: Never used  Substance Use Topics   Alcohol use: No   Drug use: No   Allergies Codeine, Ibuprofen, and Imdur [isosorbide nitrate]  Review of Systems Review of Systems  Musculoskeletal:  Positive for arthralgias and neck pain.    Physical Exam Vital Signs  I have reviewed the triage vital signs BP (!) 181/73 (BP Location: Right Arm)   Pulse 69   Temp 98.1 F (36.7 C)   Resp 16   SpO2 98%   Physical Exam Vitals and nursing note reviewed.  Constitutional:      General: She is not in acute distress.    Appearance: She is well-developed.  HENT:     Head: Normocephalic and atraumatic.  Eyes:     Conjunctiva/sclera: Conjunctivae normal.  Cardiovascular:     Rate and Rhythm: Normal rate and regular rhythm.     Heart sounds: No murmur heard. Pulmonary:     Effort: Pulmonary effort is normal. No respiratory distress.     Breath sounds: Normal breath sounds.  Abdominal:     Palpations: Abdomen is soft.     Tenderness: There is no abdominal tenderness.  Musculoskeletal:        General: Tenderness present. No swelling.     Cervical back: Neck supple. Tenderness present.  Skin:    General: Skin is warm and dry.     Capillary Refill: Capillary refill takes less than 2 seconds.  Neurological:     Mental Status: She is alert.  Psychiatric:        Mood and Affect: Mood normal.     ED Results and Treatments Labs (all labs ordered are listed, but only abnormal results are displayed) Labs Reviewed - No data to display                                                                                                                         Radiology No results found.  Pertinent labs & imaging results that were available during my care of the patient were reviewed by me and considered in my medical decision making (see MDM for details).  Medications Ordered in ED Medications - No data to display  Procedures Procedures  (including critical care time)  Medical Decision Making / ED Course   This patient presents to the ED for concern of neck pain, arm pain, this involves an extensive number of treatment options, and is a complaint that carries with it a high risk of complications and morbidity.  The differential diagnosis includes acute on chronic cervical stenosis with radiculopathy, gravity dependent edema, DVT  MDM: Patient seen emergency room for evaluation of neck and arm pain.  Physical exam with some mild swelling at the right wrist but is otherwise unremarkable.  These pains have been well documented in the past and have ultimately revealed nontraumatic imaging findings and thus we deferred additional imaging in the emergency department today.  She was given a single dose of gabapentin and on reevaluation her symptoms have improved significantly.  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  She does not have any hard neurologic deficits in the ER today and does not warrant emergent neurosurgical consultation or emergent MRI imaging today.  Patient requested a written prescription for gabapentin prior to discharge and Ace bandages of which have provided for her.  Patient then discharged.   Additional history obtained:  -External records from outside source obtained and reviewed including: Chart review  including previous notes, labs, imaging, consultation notes   Lab Tests: -I ordered, reviewed, and interpreted labs.   The pertinent results include:   Labs Reviewed - No data to display     Medicines ordered and prescription drug management: No orders of the defined types were placed in this encounter.   -I have reviewed the patients home medicines and have made adjustments as needed  Critical interventions none    Cardiac Monitoring: The patient was maintained on a cardiac monitor.  I personally viewed and interpreted the cardiac monitored which showed an underlying rhythm of: NSR  Social Determinants of Health:  Factors impacting patients care include: Frequent ER presentations   Reevaluation: After the interventions noted above, I reevaluated the patient and found that they have :improved  Co morbidities that complicate the patient evaluation  Past Medical History:  Diagnosis Date   Diabetes mellitus without complication ([place])    GERD 09/04/2006   Qualifier: Diagnosis of  By: Suzie Portela     History of echocardiogram    a. 2D ECHO: 11/06/2013 EF 65%; no WMA. Mild TR. PA pk pressure 43 mm HG   Hypertension    Stroke [facility])       Dispostion: I considered admission for this patient, but she currently does not meet inpatient criteria for admission she is safe for discharge with outpatient follow-up     Final Clinical Impression(s) / ED Diagnoses Final diagnoses:  None     @PCDICTATION @    Drago Hammonds, Debe Coder, MD 10/27/21 1330

## 2021-10-31 ENCOUNTER — Other Ambulatory Visit: Payer: Self-pay

## 2021-10-31 ENCOUNTER — Encounter (HOSPITAL_COMMUNITY): Payer: Self-pay | Admitting: *Deleted

## 2021-10-31 ENCOUNTER — Emergency Department (HOSPITAL_COMMUNITY)
Admission: EM | Admit: 2021-10-31 | Discharge: 2021-11-01 | Disposition: A | Discharge status: Home / Self Care | Payer: Medicare Other | Attending: Emergency Medicine | Admitting: Emergency Medicine

## 2021-10-31 DIAGNOSIS — M25512 Pain in left shoulder: Secondary | ICD-10-CM | POA: Insufficient documentation

## 2021-10-31 DIAGNOSIS — M25511 Pain in right shoulder: Secondary | ICD-10-CM | POA: Diagnosis not present

## 2021-10-31 DIAGNOSIS — M542 Cervicalgia: Secondary | ICD-10-CM | POA: Diagnosis present

## 2021-10-31 DIAGNOSIS — I10 Essential (primary) hypertension: Secondary | ICD-10-CM | POA: Diagnosis not present

## 2021-10-31 DIAGNOSIS — E119 Type 2 diabetes mellitus without complications: Secondary | ICD-10-CM | POA: Diagnosis not present

## 2021-10-31 DIAGNOSIS — G8929 Other chronic pain: Secondary | ICD-10-CM

## 2021-10-31 DIAGNOSIS — Z7902 Long term (current) use of antithrombotics/antiplatelets: Secondary | ICD-10-CM | POA: Diagnosis not present

## 2021-10-31 DIAGNOSIS — Z79899 Other long term (current) drug therapy: Secondary | ICD-10-CM | POA: Insufficient documentation

## 2021-10-31 NOTE — ED Triage Notes (Signed)
Pt [place]. She has been having all over body and neck pain since then. She denies numbness and tingling and is MAE x4. Alert and oriented. She does have a c collar on and states that she has been wearing it since last week because it hurts worse to take it off.

## 2021-10-31 NOTE — ED Provider Triage Note (Signed)
Emergency Medicine Provider Triage Evaluation Note  [name] , a 76 y.o. female  was evaluated in triage.  Pt complains of neck pain. She notes she fell 1 month ago. See in the ED numerous times over the past few months. Patient notes she has been wearing the c-collar for 1 month since it helps with pain.   Patient had Ct cervical spine on 9/19 which demonstrated: IMPRESSION: 1. No CT evidence of cervical spine fracture. 2. Postsurgical changes from C3-C7 ACDF without evidence of hardware complications. There is solid osseous fusion at surgical levels without significant evidence of adjacent level degenerative change. 3. There is moderate to severe spinal canal stenosis at C6-C7 secondary to a combination osseous and ligamentous hypertrophy at this level, unchanged from prior.  Review of Systems  Positive: Neck pain Negative: weakness  Physical Exam  BP (!) 137/56 (BP Location: Right Arm)   Pulse 79   Temp 98 F (36.7 C)   Resp 18   SpO2 99%  Gen:   Awake, no distress   Resp:  Normal effort  MSK:   Moves extremities without difficulty  Other:  C collar in place, equal grip strength  Medical Decision Making  Medically screening exam initiated at 9:08 PM.  Appropriate orders placed.  [name] was informed that the remainder of the evaluation will be completed by another provider, this initial triage assessment does not replace that evaluation, and the importance of remaining in the ED until their evaluation is complete.  No bony fractures on recent cervical CT Lives alone   Karie Kirks 10/31/21 2113

## 2021-10-31 NOTE — ED Triage Notes (Signed)
Pt arrives from home via [facility], fell about a month ago and she is still having neck pain. Pt had a c collar on ems arrival to the scene. En route, 144/86, hr 86, 99%, cbg 157.

## 2021-11-01 DIAGNOSIS — M542 Cervicalgia: Secondary | ICD-10-CM | POA: Diagnosis not present

## 2021-11-01 MED ORDER — LIDOCAINE 5 % EX PTCH: 1.0000 | MEDICATED_PATCH | Freq: Every day | CUTANEOUS | 0 refills | Status: DC | PRN

## 2021-11-01 MED ORDER — LIDOCAINE 5 % EX PTCH
2.0000 | MEDICATED_PATCH | CUTANEOUS | Status: DC
  Administered 2021-11-01: 2 via TRANSDERMAL
  Filled 2021-11-01: qty 2

## 2021-11-01 NOTE — Discharge Instructions (Addendum)
Please see the neurosurgeon for your spinal stenosis.  This is not something that we routinely follow in the emergency department.  Your lidocaine patches have been sent to your pharmacy.  Use them as prescribed.  Tylenol is also a good option for your pain. We hope you feel better!
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
## 2021-11-07 ENCOUNTER — Other Ambulatory Visit: Payer: Self-pay

## 2021-11-07 ENCOUNTER — Emergency Department (HOSPITAL_COMMUNITY)
Admission: EM | Admit: 2021-11-07 | Discharge: 2021-11-07 | Disposition: A | Discharge status: Home / Self Care | Payer: Medicare Other | Attending: Emergency Medicine | Admitting: Emergency Medicine

## 2021-11-07 ENCOUNTER — Encounter (HOSPITAL_COMMUNITY): Payer: Self-pay

## 2021-11-07 DIAGNOSIS — Z79899 Other long term (current) drug therapy: Secondary | ICD-10-CM | POA: Diagnosis not present

## 2021-11-07 DIAGNOSIS — M542 Cervicalgia: Secondary | ICD-10-CM | POA: Insufficient documentation

## 2021-11-07 DIAGNOSIS — M25512 Pain in left shoulder: Secondary | ICD-10-CM | POA: Diagnosis not present

## 2021-11-07 DIAGNOSIS — Z760 Encounter for issue of repeat prescription: Secondary | ICD-10-CM | POA: Diagnosis not present

## 2021-11-07 DIAGNOSIS — G8929 Other chronic pain: Secondary | ICD-10-CM | POA: Diagnosis not present

## 2021-11-07 MED ORDER — LIDOCAINE 5 % EX PTCH: 1.0000 | MEDICATED_PATCH | Freq: Every day | CUTANEOUS | 0 refills | Status: DC | PRN

## 2021-11-07 NOTE — ED Triage Notes (Signed)
Pt c/o L shoulder pain and chronic neck pain secondary to a fall on 10/10. Pt ambulatory to triage. NAD.

## 2021-11-07 NOTE — Discharge Instructions (Signed)
You were seen in the emergency department today for shoulder and neck pain.  I helped you placed your lidocaine patch, and sent a refill of your prescription to [facility] on [address] [facility].

## 2021-11-08 NOTE — ED Provider Notes (Signed)
[facility] EMERGENCY DEPARTMENT Provider Note   CSN: [id number] Arrival date & time: 11/07/21  1216     History  Chief Complaint  Patient presents with   Shoulder Pain   Neck Pain    [name] is a 76 y.o. female who presents the emergency department complaining of left shoulder pain and chronic neck pain secondary [place].  She states that she has been trying to wear lidocaine patches as previously prescribed, but she cannot reach to put them on her neck.  She continues to wear neck brace.  Of note patient is very well-known to this department, with 33 ER visits in the past 6 months, all with similar complaints.   Shoulder Pain Associated symptoms: neck pain   Neck Pain      Home Medications Prior to Admission medications   Medication Sig Start Date End Date Taking? Authorizing Provider  albuterol (VENTOLIN HFA) 108 (90 Base) MCG/ACT inhaler Inhale 1-2 puffs into the lungs every 6 (six) hours as needed for wheezing or shortness of breath. 04/22/20   Vevelyn Francois, NP  amLODipine (NORVASC) 5 MG tablet Take 1 tablet (5 mg total) by mouth daily. 10/03/21   Talbot Grumbling, FNP  chlorthalidone (HYGROTON) 25 MG tablet Take 1 tablet (25 mg total) by mouth daily. 10/05/20 10/05/21  Vevelyn Francois, NP  clopidogrel (PLAVIX) 75 MG tablet Take 1 tablet (75 mg total) by mouth daily. 06/15/21   Mitzi Hansen, MD  gabapentin (NEURONTIN) 600 MG tablet Take 0.5 tablets (300 mg total) by mouth daily. 10/27/21   Kommor, Madison, MD  lidocaine (LIDODERM) 5 % Place 1 patch onto the skin daily as needed. Apply patch to area most significant pain once per day.  Remove and discard patch within 12 hours of application. 11/07/21   Kenadi Miltner T, PA-C  losartan (COZAAR) 100 MG tablet Take 1 tablet (100 mg total) by mouth daily for 14 days. 09/08/21 09/22/21  Tretha Sciara, MD  losartan (COZAAR) 50 MG tablet Take 1 tablet (50 mg total) by mouth daily.  06/15/21 06/15/22  Mitzi Hansen, MD  metFORMIN (GLUCOPHAGE) 500 MG tablet Take 1 tablet (500 mg total) by mouth 2 (two) times daily with a meal. 09/05/21 09/05/22  Wynona Dove A, DO  pantoprazole (PROTONIX) 20 MG tablet Take 1 tablet (20 mg total) by mouth daily. 10/10/21   Isla Pence, MD  rosuvastatin (CRESTOR) 10 MG tablet Take 1 tablet (10 mg total) by mouth at bedtime. 06/15/21 06/15/22  Mitzi Hansen, MD      Allergies    Codeine, Ibuprofen, and Imdur [isosorbide nitrate]    Review of Systems   Review of Systems  Musculoskeletal:  Positive for arthralgias and neck pain.  All other systems reviewed and are negative.   Physical Exam Updated Vital Signs BP 139/65 (BP Location: Left Arm)   Pulse 73   Temp (!) 97.5 F (36.4 C) (Oral)   Resp 16   SpO2 99%  Physical Exam Vitals and nursing note reviewed.  Constitutional:      Appearance: Normal appearance.  HENT:     Head: Normocephalic and atraumatic.  Eyes:     Conjunctiva/sclera: Conjunctivae normal.  Neck:     Comments: No midline spinal tenderness, step-offs or crepitus.  Generalized tenderness to palpation of the paraspinal cervical muscles Pulmonary:     Effort: Pulmonary effort is normal. No respiratory distress.  Skin:    General: Skin is warm and dry.  Neurological:     Mental Status: She is alert.  Psychiatric:        Mood and Affect: Mood normal.        Behavior: Behavior normal.     ED Results / Procedures / Treatments   Labs (all labs ordered are listed, but only abnormal results are displayed) Labs Reviewed - No data to display  EKG None  Radiology No results found.  Procedures Procedures    Medications Ordered in ED Medications - No data to display  ED Course/ Medical Decision Making/ A&P                           Medical Decision Making Risk Prescription drug management.   This patient is a 76 y.o. female who presents to the ED for concern of chronic neck and left shoulder  pain.   Past Medical History / Social History / Additional history: Chart reviewed. Pertinent results include: Diabetes, hypertension, GERD.  Of note, this is patient's 33rd ER visit in the past 6 months for similar symptoms.  All with reassuring work-ups.  Physical Exam: Physical exam performed. The pertinent findings include: No midline spinal tenderness, step-offs or crepitus.  Cervical paraspinal muscular tenderness.  Normal vital signs.  In no acute distress.  Medications / Treatment: Assisted patient in placing her on lidocaine patch on her neck.  Will send refill of patches to her pharmacy.   Disposition: After consideration of the diagnostic results and the patients response to treatment, I feel that emergency department workup does not suggest an emergent condition requiring admission or immediate intervention beyond what has been performed at this time. The plan is: Discharge to home with refill of lidocaine patch prescription. The patient is safe for discharge and has been instructed to return immediately for worsening symptoms, change in symptoms or any other concerns.         Final Clinical Impression(s) / ED Diagnoses Final diagnoses:  Chronic neck pain  Chronic left shoulder pain  Medication refill    Rx / DC Orders ED Discharge Orders          Ordered    lidocaine (LIDODERM) 5 %  Daily PRN        11/07/21 1306           Portions of this report may have been transcribed using voice recognition software. Every effort was made to ensure accuracy; however, inadvertent computerized transcription errors may be present.    Estill Cotta 11/08/21 0912    Carmin Muskrat, MD 11/08/21 1245

## 2021-11-18 ENCOUNTER — Other Ambulatory Visit: Payer: Self-pay | Admitting: Nurse Practitioner

## 2021-11-28 ENCOUNTER — Ambulatory Visit: Payer: Self-pay | Admitting: Nurse Practitioner

## 2021-11-29 ENCOUNTER — Emergency Department (HOSPITAL_COMMUNITY)
Admission: EM | Admit: 2021-11-29 | Discharge: 2021-11-30 | Disposition: A | Discharge status: Home / Self Care | Payer: Medicare Other | Attending: Emergency Medicine | Admitting: Emergency Medicine

## 2021-11-29 ENCOUNTER — Other Ambulatory Visit: Payer: Self-pay

## 2021-11-29 ENCOUNTER — Encounter (HOSPITAL_COMMUNITY): Payer: Self-pay | Admitting: *Deleted

## 2021-11-29 DIAGNOSIS — G894 Chronic pain syndrome: Secondary | ICD-10-CM | POA: Insufficient documentation

## 2021-11-29 DIAGNOSIS — R519 Headache, unspecified: Secondary | ICD-10-CM | POA: Insufficient documentation

## 2021-11-29 DIAGNOSIS — M25519 Pain in unspecified shoulder: Secondary | ICD-10-CM | POA: Insufficient documentation

## 2021-11-29 DIAGNOSIS — Z7902 Long term (current) use of antithrombotics/antiplatelets: Secondary | ICD-10-CM | POA: Insufficient documentation

## 2021-11-29 DIAGNOSIS — M542 Cervicalgia: Secondary | ICD-10-CM | POA: Insufficient documentation
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
## 2021-11-29 NOTE — ED Provider Triage Note (Signed)
Emergency Medicine Provider Triage Evaluation Note  [name] , a 76 y.o. female  was evaluated in triage.  Pt complains of headache for several years.  Also has some neck pain, however after wearing her neck brace she reports that there is improvement in her symptoms.   Review of Systems  Positive: Headache, neck pain Negative: Fever, visual changes  Physical Exam  There were no vitals taken for this visit. Gen:   Awake, no distress   Resp:  Normal effort  MSK:   Moves extremities without difficulty  Other:  [place] collar in place, no pain with palpation of the midline.   Medical Decision Making  Medically screening exam initiated at 4:02 PM.  Appropriate orders placed.  [name] was informed that the remainder of the evaluation will be completed by another provider, this initial triage assessment does not replace that evaluation, and the importance of remaining in the ED until their evaluation is complete.     [name], PA-C 11/29/21 1606

## 2021-11-30 NOTE — Discharge Instructions (Signed)
I cannot find the eyedrops you are on prior to this. Follow-up with your doctors as needed.
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
## 2021-12-11 ENCOUNTER — Emergency Department (HOSPITAL_COMMUNITY)
Admission: EM | Admit: 2021-12-11 | Discharge: 2021-12-11 | Disposition: A | Discharge status: Home / Self Care | Payer: Medicare Other | Attending: Emergency Medicine | Admitting: Emergency Medicine

## 2021-12-11 ENCOUNTER — Other Ambulatory Visit: Payer: Self-pay

## 2021-12-11 ENCOUNTER — Emergency Department (HOSPITAL_COMMUNITY): Payer: Medicare Other

## 2021-12-11 DIAGNOSIS — Z79899 Other long term (current) drug therapy: Secondary | ICD-10-CM | POA: Insufficient documentation

## 2021-12-11 DIAGNOSIS — M542 Cervicalgia: Secondary | ICD-10-CM | POA: Diagnosis present

## 2021-12-11 DIAGNOSIS — Z7984 Long term (current) use of oral hypoglycemic drugs: Secondary | ICD-10-CM | POA: Diagnosis not present

## 2021-12-11 DIAGNOSIS — M4802 Spinal stenosis, cervical region: Secondary | ICD-10-CM | POA: Insufficient documentation

## 2021-12-11 DIAGNOSIS — I10 Essential (primary) hypertension: Secondary | ICD-10-CM | POA: Diagnosis not present

## 2021-12-11 DIAGNOSIS — E119 Type 2 diabetes mellitus without complications: Secondary | ICD-10-CM | POA: Diagnosis not present

## 2021-12-11 MED ORDER — LIDOCAINE 5 % EX PTCH
1.0000 | MEDICATED_PATCH | CUTANEOUS | Status: DC
  Administered 2021-12-11: 1 via TRANSDERMAL
  Filled 2021-12-11: qty 1

## 2021-12-11 MED ORDER — LIDOCAINE 5 % EX PTCH: 1.0000 | MEDICATED_PATCH | Freq: Every day | CUTANEOUS | 0 refills | Status: DC | PRN
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
## 2021-12-18 ENCOUNTER — Emergency Department (HOSPITAL_COMMUNITY)
Admission: EM | Admit: 2021-12-18 | Discharge: 2021-12-19 | Disposition: A | Discharge status: Home / Self Care | Payer: Medicare Other | Attending: Emergency Medicine | Admitting: Emergency Medicine

## 2021-12-18 DIAGNOSIS — Z7984 Long term (current) use of oral hypoglycemic drugs: Secondary | ICD-10-CM | POA: Insufficient documentation

## 2021-12-18 DIAGNOSIS — M542 Cervicalgia: Secondary | ICD-10-CM | POA: Diagnosis present

## 2021-12-18 DIAGNOSIS — G8929 Other chronic pain: Secondary | ICD-10-CM | POA: Diagnosis not present

## 2021-12-18 DIAGNOSIS — R519 Headache, unspecified: Secondary | ICD-10-CM | POA: Insufficient documentation

## 2021-12-18 DIAGNOSIS — Z7902 Long term (current) use of antithrombotics/antiplatelets: Secondary | ICD-10-CM | POA: Diagnosis not present

## 2021-12-18 NOTE — ED Triage Notes (Addendum)
Pt to ED c/o neck pain x 1 month, reports when was at ED prior , did not get pain medication RX. In neck brace currently

## 2021-12-18 NOTE — ED Provider Triage Note (Signed)
Emergency Medicine Provider Triage Evaluation Note  [name] , a 76 y.o. female  was evaluated in triage.  Pt complains of neck and left shoulder pain and "whole body" fell about  1 month ago. Dr. Marland Kitchen Didn't write me nothing for pain."  Review of Systems  Positive: Neck pain  Negative: Weakness  Physical Exam  BP (!) 118/59   Pulse 72   Temp 97.8 F (36.6 C) (Oral)   Resp 16   Ht 5\' 4"  (1.626 m)   SpO2 99%   BMI 18.01 kg/m  Gen:   Awake, no distress     Resp:  Normal effort  MSK:   Moves extremities without difficulty   Other:   Neck brace on  Medical Decision Making  Medically screening exam initiated at 3:58 PM.  Appropriate orders placed.  Latoy Labriola was informed that the remainder of the evaluation will be completed by another provider, this initial triage assessment does not replace that evaluation, and the importance of remaining in the ED until their evaluation is complete.     Rilda, Bulls, PA-C 12/18/21 1559

## 2021-12-19 DIAGNOSIS — M542 Cervicalgia: Secondary | ICD-10-CM | POA: Diagnosis not present

## 2021-12-19 MED ORDER — LIDOCAINE 5 % EX PTCH
1.0000 | MEDICATED_PATCH | CUTANEOUS | Status: DC
  Administered 2021-12-19: 1 via TRANSDERMAL

## 2021-12-19 MED ORDER — LIDOCAINE 5 % EX PTCH: 1.0000 | MEDICATED_PATCH | Freq: Every day | CUTANEOUS | 0 refills | Status: DC | PRN
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
## 2021-12-19 NOTE — Discharge Instructions (Addendum)
Thank you for allowing me to be part of your care today.  I have treated you in the ER with a lidocaine patch and sent over a prescription to your pharmacy for the same.  I was unable to locate a prior prescription for eyedrops, so I am unable to prescribe this for you.  I recommend following up with your family medicine doctor or primary care physician regarding your eye concerns.  It is still highly recommended that you follow-up with outpatient neurosurgery regarding your chronic neck pain.  I also recommend the use of Tylenol or ibuprofen to help with pain and inflammation.  Return to the ER if you have any new or worsening changes or new concerns.

## 2021-12-19 NOTE — ED Notes (Signed)
Bus pass provided.

## 2022-01-03 ENCOUNTER — Emergency Department (HOSPITAL_COMMUNITY)
Admission: EM | Admit: 2022-01-03 | Discharge: 2022-01-03 | Disposition: A | Discharge status: Home / Self Care | Payer: Medicare Other | Attending: Emergency Medicine | Admitting: Emergency Medicine

## 2022-01-03 ENCOUNTER — Other Ambulatory Visit: Payer: Self-pay

## 2022-01-03 DIAGNOSIS — M4802 Spinal stenosis, cervical region: Secondary | ICD-10-CM | POA: Insufficient documentation

## 2022-01-03 DIAGNOSIS — M542 Cervicalgia: Secondary | ICD-10-CM | POA: Diagnosis present

## 2022-01-03 MED ORDER — ACETAMINOPHEN 500 MG PO TABS
1000.0000 mg | ORAL_TABLET | Freq: Once | ORAL | Status: AC
  Administered 2022-01-03: 1000 mg via ORAL
  Filled 2022-01-03: qty 2

## 2022-01-03 MED ORDER — LIDOCAINE 5 % EX PTCH: 1.0000 | MEDICATED_PATCH | CUTANEOUS | 0 refills | Status: DC
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
## 2022-01-03 NOTE — Discharge Instructions (Addendum)
Your exam today is overall reassuring.  I have sent lidocaine patch into the pharmacy for you.  You were given a dose of Tylenol in the emergency room.  I recommend you follow-up with your neurosurgeon.  For any concerning symptoms you can return to the emergency room.  Only use 1 lidocaine patch at a time.
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
## 2022-01-09 ENCOUNTER — Emergency Department (HOSPITAL_COMMUNITY)
Admission: EM | Admit: 2022-01-09 | Discharge: 2022-01-09 | Disposition: A | Discharge status: Home / Self Care | Payer: Medicare Other | Attending: Physician Assistant | Admitting: Physician Assistant

## 2022-01-09 ENCOUNTER — Other Ambulatory Visit: Payer: Self-pay

## 2022-01-09 DIAGNOSIS — G8929 Other chronic pain: Secondary | ICD-10-CM | POA: Insufficient documentation

## 2022-01-09 DIAGNOSIS — M542 Cervicalgia: Secondary | ICD-10-CM | POA: Insufficient documentation

## 2022-01-09 DIAGNOSIS — Z7984 Long term (current) use of oral hypoglycemic drugs: Secondary | ICD-10-CM | POA: Diagnosis not present

## 2022-01-09 DIAGNOSIS — Z7902 Long term (current) use of antithrombotics/antiplatelets: Secondary | ICD-10-CM | POA: Insufficient documentation

## 2022-01-09 MED ORDER — DICLOFENAC SODIUM 1 % EX GEL: 2.0000 g | Freq: Four times a day (QID) | CUTANEOUS | Status: DC

## 2022-01-09 MED ORDER — DICLOFENAC SODIUM 1 % EX GEL: 2.0000 g | Freq: Four times a day (QID) | CUTANEOUS | 0 refills | Status: DC
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
## 2022-01-09 NOTE — Discharge Instructions (Addendum)
Your prior imaging shows you have severe stenosis in your cervical spine.  You really should be seeing a specialist about this.  You may have options besides surgery since you do not want to have surgery if indicated.  Also follow-up with pain management to help manage your chronic pain and keep you more comfortable.  In the meantime you have been given some topical diclofenac to help with your symptoms.  Come back to the ER if you have new or worsening symptoms such as weakness fever, severe lesion pain or any other worrisome changes

## 2022-01-20 ENCOUNTER — Emergency Department (HOSPITAL_COMMUNITY)
Admission: EM | Admit: 2022-01-20 | Discharge: 2022-01-20 | Disposition: A | Discharge status: Home / Self Care | Payer: Medicare Other | Attending: Emergency Medicine | Admitting: Emergency Medicine

## 2022-01-20 ENCOUNTER — Encounter (HOSPITAL_COMMUNITY): Payer: Self-pay | Admitting: Emergency Medicine

## 2022-01-20 ENCOUNTER — Emergency Department (HOSPITAL_COMMUNITY): Payer: Medicare Other

## 2022-01-20 DIAGNOSIS — I10 Essential (primary) hypertension: Secondary | ICD-10-CM | POA: Diagnosis not present

## 2022-01-20 DIAGNOSIS — W1789XA Other fall from one level to another, initial encounter: Secondary | ICD-10-CM | POA: Diagnosis not present

## 2022-01-20 DIAGNOSIS — Z8673 Personal history of transient ischemic attack (TIA), and cerebral infarction without residual deficits: Secondary | ICD-10-CM | POA: Diagnosis not present

## 2022-01-20 DIAGNOSIS — Z7902 Long term (current) use of antithrombotics/antiplatelets: Secondary | ICD-10-CM | POA: Diagnosis not present

## 2022-01-20 DIAGNOSIS — M25531 Pain in right wrist: Secondary | ICD-10-CM | POA: Insufficient documentation

## 2022-01-20 DIAGNOSIS — E119 Type 2 diabetes mellitus without complications: Secondary | ICD-10-CM | POA: Insufficient documentation

## 2022-01-20 DIAGNOSIS — S0990XA Unspecified injury of head, initial encounter: Secondary | ICD-10-CM | POA: Insufficient documentation

## 2022-01-20 DIAGNOSIS — R519 Headache, unspecified: Secondary | ICD-10-CM | POA: Diagnosis present

## 2022-01-20 DIAGNOSIS — M25532 Pain in left wrist: Secondary | ICD-10-CM

## 2022-01-20 DIAGNOSIS — Y92811 Bus as the place of occurrence of the external cause: Secondary | ICD-10-CM | POA: Insufficient documentation

## 2022-01-20 MED ORDER — ACETAMINOPHEN 500 MG PO TABS
1000.0000 mg | ORAL_TABLET | ORAL | Status: AC
  Administered 2022-01-20: 1000 mg via ORAL
  Filled 2022-01-20: qty 2

## 2022-01-20 NOTE — Discharge Instructions (Addendum)
Ice and elevate your wrist to help with the pain and swelling.  Wear the wrist brace on your right wrist as this 1 seems to be more swollen and painful.  Tylenol 1000 mg every 6 hours for pain.  Please follow-up with your primary care provider if you do not have 1 please follow-up with the [facility].  The CT scan of your head and neck is without any fractures.  There were no fractures in your wrist.
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
## 2022-01-20 NOTE — ED Notes (Signed)
Patient transported to CT 

## 2022-01-20 NOTE — ED Notes (Signed)
Patient transported to radiology
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
## 2022-01-20 NOTE — ED Notes (Signed)
Patient back from imaging

## 2022-01-21 ENCOUNTER — Other Ambulatory Visit: Payer: Self-pay | Admitting: Nurse Practitioner

## 2022-01-26 ENCOUNTER — Encounter (HOSPITAL_COMMUNITY): Payer: Self-pay

## 2022-01-26 ENCOUNTER — Emergency Department (HOSPITAL_COMMUNITY)
Admission: EM | Admit: 2022-01-26 | Discharge: 2022-01-26 | Disposition: A | Discharge status: Home / Self Care | Payer: Medicare Other | Attending: Emergency Medicine | Admitting: Emergency Medicine

## 2022-01-26 DIAGNOSIS — E119 Type 2 diabetes mellitus without complications: Secondary | ICD-10-CM | POA: Diagnosis not present

## 2022-01-26 DIAGNOSIS — Z7984 Long term (current) use of oral hypoglycemic drugs: Secondary | ICD-10-CM | POA: Diagnosis not present

## 2022-01-26 DIAGNOSIS — I10 Essential (primary) hypertension: Secondary | ICD-10-CM | POA: Insufficient documentation

## 2022-01-26 DIAGNOSIS — M25539 Pain in unspecified wrist: Secondary | ICD-10-CM | POA: Insufficient documentation

## 2022-01-26 DIAGNOSIS — M542 Cervicalgia: Secondary | ICD-10-CM | POA: Insufficient documentation

## 2022-01-26 DIAGNOSIS — G894 Chronic pain syndrome: Secondary | ICD-10-CM | POA: Insufficient documentation

## 2022-01-26 DIAGNOSIS — M25532 Pain in left wrist: Secondary | ICD-10-CM | POA: Insufficient documentation

## 2022-01-26 DIAGNOSIS — M25531 Pain in right wrist: Secondary | ICD-10-CM

## 2022-01-26 DIAGNOSIS — Z79899 Other long term (current) drug therapy: Secondary | ICD-10-CM | POA: Diagnosis not present

## 2022-01-26 DIAGNOSIS — Z7902 Long term (current) use of antithrombotics/antiplatelets: Secondary | ICD-10-CM | POA: Diagnosis not present

## 2022-01-26 DIAGNOSIS — G8929 Other chronic pain: Secondary | ICD-10-CM

## 2022-01-26 MED ORDER — DICLOFENAC SODIUM 1 % EX GEL
2.0000 g | Freq: Four times a day (QID) | CUTANEOUS | Status: DC
  Filled 2022-01-26: qty 100

## 2022-01-26 MED ORDER — IBUPROFEN 400 MG PO TABS
600.0000 mg | ORAL_TABLET | Freq: Once | ORAL | Status: AC
  Administered 2022-01-26: 600 mg via ORAL
  Filled 2022-01-26: qty 1

## 2022-01-26 NOTE — Discharge Instructions (Addendum)
Thank you for letting us take care of you today.   Your exam was reassuring and your symptoms are chronic in nature. I reviewed previous imaging of your neck which shows spinal stenosis and of your wrists which you have had since your reported fall and did not show any new fracture or abnormalities of the bones.  With your history of spinal stenosis, I recommend you follow up with neurosurgery. This information has been provided to you.  I also recommend you establish a relationship with a primary care provider. I have provided the names of 2 clinics you may contact to make an appointment. They may also want you to see an orthopedist should you continue to have problems with your wrists though at this time you expressed to me you are not interested in seeing an orthopedist. I do recommend you follow up with primary care regardless.  There is no indication to wear the cervical or neck collar. I do not recommend you wear it. After discussing this with you, you stated you will keep wearing it but please be advised this is not recommended and can actually lead to worsening of your pain and condition.  Continue to use the topical medications discussed to help with your pain. You may use Voltaren gel or lidocaine patches to help with your pain. Both of these can be purchased at any local pharmacy.  Should you develop chest pain, shortness of breath, weakness in your arms or legs, severe headache, new injury, or other concerns, please return to nearest emergency department for re-evaluation.
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
## 2022-01-26 NOTE — ED Notes (Addendum)
Pt would like to leave.  Does not wish to wait for the Voltaren medication. Pt ambulated to the lobby.
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
## 2022-01-27 ENCOUNTER — Telehealth: Payer: Self-pay

## 2022-01-27 NOTE — Telephone Encounter (Signed)
Patient is at [facility] stating she should have a prescription. No prescriptions ordered. Patient left before DC papers. Voltaren gel can be found OTC, pharmacist will direct her.

## 2022-01-29 ENCOUNTER — Emergency Department (HOSPITAL_COMMUNITY)
Admission: EM | Admit: 2022-01-29 | Discharge: 2022-01-29 | Disposition: A | Discharge status: Home / Self Care | Payer: Medicare Other | Attending: Emergency Medicine | Admitting: Emergency Medicine

## 2022-01-29 ENCOUNTER — Other Ambulatory Visit: Payer: Self-pay

## 2022-01-29 DIAGNOSIS — M79673 Pain in unspecified foot: Secondary | ICD-10-CM | POA: Insufficient documentation

## 2022-01-29 DIAGNOSIS — Z79899 Other long term (current) drug therapy: Secondary | ICD-10-CM | POA: Diagnosis not present

## 2022-01-29 DIAGNOSIS — Z7902 Long term (current) use of antithrombotics/antiplatelets: Secondary | ICD-10-CM | POA: Diagnosis not present

## 2022-01-29 DIAGNOSIS — M25531 Pain in right wrist: Secondary | ICD-10-CM | POA: Diagnosis not present

## 2022-01-29 DIAGNOSIS — Z7984 Long term (current) use of oral hypoglycemic drugs: Secondary | ICD-10-CM | POA: Diagnosis not present

## 2022-01-29 DIAGNOSIS — M79643 Pain in unspecified hand: Secondary | ICD-10-CM | POA: Diagnosis not present

## 2022-01-29 DIAGNOSIS — R209 Unspecified disturbances of skin sensation: Secondary | ICD-10-CM | POA: Insufficient documentation

## 2022-01-29 DIAGNOSIS — M542 Cervicalgia: Secondary | ICD-10-CM | POA: Diagnosis not present

## 2022-01-29 DIAGNOSIS — Z09 Encounter for follow-up examination after completed treatment for conditions other than malignant neoplasm: Secondary | ICD-10-CM | POA: Diagnosis not present

## 2022-01-29 DIAGNOSIS — G8929 Other chronic pain: Secondary | ICD-10-CM | POA: Diagnosis not present

## 2022-01-29 DIAGNOSIS — M25532 Pain in left wrist: Secondary | ICD-10-CM | POA: Diagnosis not present

## 2022-01-29 MED ORDER — ACETAMINOPHEN 500 MG PO TABS
1000.0000 mg | ORAL_TABLET | ORAL | Status: AC
  Administered 2022-01-29: 1000 mg via ORAL
  Filled 2022-01-29: qty 2

## 2022-01-29 NOTE — Discharge Instructions (Signed)
You were seen for your wrist and neck pain in the emergency department.   At home, please take Tylenol and use the wrist wraps for comfort.    Check your MyChart online for the results of any tests that had not resulted by the time you left the emergency department.   Follow-up with your primary doctor in 2-3 days regarding your visit.  Please follow-up as soon as possible with the sports medicine doctors about your wrist pain and with the spine surgeons about your neck pain.  Return immediately to the emergency department if you experience any of the following: New weakness, numbness between your legs, bowel or bladder incontinence, or any other concerning symptoms.    Thank you for visiting our Emergency Department. It was a pleasure taking care of you today.
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
## 2022-01-29 NOTE — ED Notes (Signed)
Dc instructions reviewed with pt. Pt ambulated to lobby at time of discharge
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
## 2022-01-29 NOTE — ED Triage Notes (Signed)
Pt presents with on going pain in hands and feet. Pt states the medicine she was given during last visit has not helped. Pt has had multiple ED visits for same in the last month.

## 2022-02-05 ENCOUNTER — Other Ambulatory Visit: Payer: Self-pay

## 2022-02-05 ENCOUNTER — Emergency Department (HOSPITAL_COMMUNITY)
Admission: EM | Admit: 2022-02-05 | Discharge: 2022-02-05 | Disposition: A | Discharge status: Home / Self Care | Payer: 59 | Attending: Emergency Medicine | Admitting: Emergency Medicine

## 2022-02-05 ENCOUNTER — Encounter (HOSPITAL_COMMUNITY): Payer: Self-pay

## 2022-02-05 DIAGNOSIS — M25579 Pain in unspecified ankle and joints of unspecified foot: Secondary | ICD-10-CM | POA: Insufficient documentation

## 2022-02-05 DIAGNOSIS — M791 Myalgia, unspecified site: Secondary | ICD-10-CM | POA: Diagnosis not present

## 2022-02-05 DIAGNOSIS — Z79899 Other long term (current) drug therapy: Secondary | ICD-10-CM | POA: Insufficient documentation

## 2022-02-05 DIAGNOSIS — M542 Cervicalgia: Secondary | ICD-10-CM | POA: Diagnosis not present

## 2022-02-05 DIAGNOSIS — M2550 Pain in unspecified joint: Secondary | ICD-10-CM

## 2022-02-05 MED ORDER — GABAPENTIN 300 MG PO CAPS
300.0000 mg | ORAL_CAPSULE | Freq: Once | ORAL | Status: AC
  Administered 2022-02-05: 300 mg via ORAL
  Filled 2022-02-05: qty 1

## 2022-02-05 MED ORDER — OXYCODONE-ACETAMINOPHEN 5-325 MG PO TABS
1.0000 | ORAL_TABLET | Freq: Once | ORAL | Status: AC
  Administered 2022-02-05: 1 via ORAL
  Filled 2022-02-05: qty 1

## 2022-02-05 NOTE — ED Provider Notes (Signed)
[place] EMERGENCY DEPARTMENT Provider Note   CSN: [id number] Arrival date & time: 02/05/22  1217     History  Chief Complaint  Patient presents with   Joint Pain    [name] is a 77 y.o. female presented to ED with diffuse joint pain and neck pain.  She has seen many many times in the emergency department for similar symptoms.  She says she comes here because it is closer and she cannot get to a doctor's office.  She has no acute complaints.  She wears a C-spine collar because it feels more comfortable for her.  HPI     Home Medications Prior to Admission medications   Medication Sig Start Date End Date Taking? Authorizing Provider  albuterol (VENTOLIN HFA) 108 (90 Base) MCG/ACT inhaler Inhale 1-2 puffs into the lungs every 6 (six) hours as needed for wheezing or shortness of breath. 04/22/20   Vevelyn Francois, NP  amLODipine (NORVASC) 5 MG tablet Take 1 tablet (5 mg total) by mouth daily. 10/03/21   Talbot Grumbling, FNP  chlorthalidone (HYGROTON) 25 MG tablet TAKE 1 TABLET (25 MG TOTAL) BY MOUTH DAILY. 11/20/21 11/20/22  Renee Rival, FNP  clopidogrel (PLAVIX) 75 MG tablet Take 1 tablet (75 mg total) by mouth daily. 06/15/21   Mitzi Hansen, MD  diclofenac Sodium (VOLTAREN) 1 % GEL Apply 2 g topically 4 (four) times daily. 01/09/22   Sherrye Payor A, PA-C  gabapentin (NEURONTIN) 600 MG tablet Take 0.5 tablets (300 mg total) by mouth daily. 10/27/21   Kommor, Madison, MD  lidocaine (LIDODERM) 5 % Place 1 patch onto the skin daily. Remove & Discard patch within 12 hours or as directed by MD 01/03/22   Evlyn Courier, PA-C  losartan (COZAAR) 100 MG tablet Take 1 tablet (100 mg total) by mouth daily for 14 days. 09/08/21 09/22/21  Tretha Sciara, MD  losartan (COZAAR) 50 MG tablet Take 1 tablet (50 mg total) by mouth daily. 06/15/21 06/15/22  Mitzi Hansen, MD  metFORMIN (GLUCOPHAGE) 500 MG tablet Take 1 tablet (500 mg total) by mouth 2 (two) times  daily with a meal. 09/05/21 09/05/22  Wynona Dove A, DO  pantoprazole (PROTONIX) 20 MG tablet Take 1 tablet (20 mg total) by mouth daily. 10/10/21   Isla Pence, MD  rosuvastatin (CRESTOR) 10 MG tablet Take 1 tablet (10 mg total) by mouth at bedtime. 06/15/21 06/15/22  Mitzi Hansen, MD      Allergies    Codeine, Ibuprofen, and Imdur [isosorbide nitrate]    Review of Systems   Review of Systems  Physical Exam Updated Vital Signs BP (!) 161/63   Pulse 72   Temp 98.1 F (36.7 C) (Oral)   Resp 18   SpO2 99%  Physical Exam Constitutional:      General: She is not in acute distress. HENT:     Head: Normocephalic and atraumatic.  Eyes:     Conjunctiva/sclera: Conjunctivae normal.     Pupils: Pupils are equal, round, and reactive to light.  Neck:     Comments: C-spine collar in place Cardiovascular:     Rate and Rhythm: Normal rate and regular rhythm.  Pulmonary:     Effort: Pulmonary effort is normal. No respiratory distress.  Abdominal:     General: There is no distension.     Tenderness: There is no abdominal tenderness.  Skin:    General: Skin is warm and dry.  Neurological:     General: No focal deficit  present.     Mental Status: She is alert. Mental status is at baseline.  Psychiatric:        Mood and Affect: Mood normal.        Behavior: Behavior normal.     ED Results / Procedures / Treatments   Labs (all labs ordered are listed, but only abnormal results are displayed) Labs Reviewed - No data to display  EKG None  Radiology No results found.  Procedures Procedures    Medications Ordered in ED Medications  gabapentin (NEURONTIN) tablet 300 mg (has no administration in time range)  oxyCODONE-acetaminophen (PERCOCET/ROXICET) 5-325 MG per tablet 1 tablet (1 tablet Oral Given 02/05/22 1255)    ED Course/ Medical Decision Making/ A&P                             Medical Decision Making  Patient is here with myalgia and polyarthralgia.  This is a  chronic complaint.  Low suspicion for sepsis or acute traumatic injury or fracture.  I do not see an indication for imaging at this time.  Will treat with some gabapentin and ibuprofen and discharge her.        Final Clinical Impression(s) / ED Diagnoses Final diagnoses:  Arthralgia, unspecified joint  Myalgia    Rx / DC Orders ED Discharge Orders     None         Dondi Burandt, Carola Rhine, MD 02/05/22 1458

## 2022-02-05 NOTE — ED Notes (Signed)
Patient provided discharge paperwork. Patient aware that she does not have primary care doctor at this time and reports that she comes and will continue to come to [facility] because it is closer to her house and she does not have far to walk.

## 2022-02-05 NOTE — ED Triage Notes (Signed)
Pt c/o HA and joint pain in hands and wrists x 1 week; endorses fall x 1 month ago, neck since then; evaluated after fall, sates x ray negative; pt arrived in c collar, states it helps her neck pain

## 2022-02-05 NOTE — ED Provider Triage Note (Signed)
Emergency Medicine Provider Triage Evaluation Note  [name] , a 77 y.o. female  was evaluated in triage.  Pt complains of ongoing wrist pain, joint pain, left shoulder pain, neck pain, headache since fall on 12/30.  Patient has been evaluated multiple times for the same injuries, she has a cervical collar in place with but no evidence of unstable cervical spine fracture, patient is wearing it for comfort.  She denies any new fall, injury, she is not trying Tylenol, ibuprofen or any other treatment at home.  She denies any vision changes, numbness, tingling with her headache.  Review of Systems  Positive: Joint pain, headache Negative: Fever, chills, neuro deficity  Physical Exam  BP (!) 161/63   Pulse 72   Temp 98.1 F (36.7 C) (Oral)   Resp 18   SpO2 99%  Gen:   Awake, no distress   Resp:  Normal effort  MSK:   Moves extremities without difficulty  Other:  Moves all 4 limbs spontaneously, CN II through XII grossly intact, can ambulate without difficulty, intact sensation throughout.   Medical Decision Making  Medically screening exam initiated at 12:50 PM.  Appropriate orders placed.  [name] was informed that the remainder of the evaluation will be completed by another provider, this initial triage assessment does not replace that evaluation, and the importance of remaining in the ED until their evaluation is complete.  Workup initiated   [name], [place] 02/05/22 1253

## 2022-02-05 NOTE — Discharge Instructions (Signed)
Please see a primary care clinic for your chronic neck and muscle pain.

## 2022-02-12 ENCOUNTER — Other Ambulatory Visit: Payer: Self-pay

## 2022-02-12 ENCOUNTER — Encounter (HOSPITAL_COMMUNITY): Payer: Self-pay

## 2022-02-12 ENCOUNTER — Emergency Department (HOSPITAL_COMMUNITY)
Admission: EM | Admit: 2022-02-12 | Discharge: 2022-02-12 | Disposition: A | Discharge status: Home / Self Care | Payer: 59 | Attending: Emergency Medicine | Admitting: Emergency Medicine

## 2022-02-12 ENCOUNTER — Emergency Department (HOSPITAL_COMMUNITY): Payer: 59

## 2022-02-12 DIAGNOSIS — Y92410 Unspecified street and highway as the place of occurrence of the external cause: Secondary | ICD-10-CM | POA: Insufficient documentation

## 2022-02-12 DIAGNOSIS — Z7902 Long term (current) use of antithrombotics/antiplatelets: Secondary | ICD-10-CM | POA: Insufficient documentation

## 2022-02-12 DIAGNOSIS — M25532 Pain in left wrist: Secondary | ICD-10-CM | POA: Insufficient documentation

## 2022-02-12 DIAGNOSIS — M542 Cervicalgia: Secondary | ICD-10-CM | POA: Insufficient documentation

## 2022-02-12 DIAGNOSIS — M25531 Pain in right wrist: Secondary | ICD-10-CM | POA: Diagnosis not present

## 2022-02-12 DIAGNOSIS — M7989 Other specified soft tissue disorders: Secondary | ICD-10-CM | POA: Insufficient documentation

## 2022-02-12 MED ORDER — ACETAMINOPHEN 325 MG PO TABS: 650.0000 mg | ORAL_TABLET | Freq: Four times a day (QID) | ORAL | 0 refills | Status: DC | PRN

## 2022-02-12 MED ORDER — ACETAMINOPHEN 325 MG PO TABS
650.0000 mg | ORAL_TABLET | Freq: Once | ORAL | Status: AC
  Administered 2022-02-12: 650 mg via ORAL
  Filled 2022-02-12: qty 2

## 2022-02-12 NOTE — Discharge Instructions (Addendum)
Please follow-up with your primary care doctor, make sure you are elevating your arms, and using your wrist, to help prevent any kind of swelling of your arms.  If you develop worsening swelling, redness, warmth to the area please return to the ER.

## 2022-02-12 NOTE — ED Notes (Signed)
DC instructions reviewed with pt. Pt verbalized understanding. Pt dC
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
## 2022-02-14 ENCOUNTER — Encounter (HOSPITAL_COMMUNITY): Payer: Self-pay

## 2022-02-14 ENCOUNTER — Emergency Department (HOSPITAL_COMMUNITY)
Admission: EM | Admit: 2022-02-14 | Discharge: 2022-02-15 | Disposition: A | Discharge status: Home / Self Care | Payer: 59 | Attending: Emergency Medicine | Admitting: Emergency Medicine

## 2022-02-14 DIAGNOSIS — E119 Type 2 diabetes mellitus without complications: Secondary | ICD-10-CM | POA: Diagnosis not present

## 2022-02-14 DIAGNOSIS — Z7902 Long term (current) use of antithrombotics/antiplatelets: Secondary | ICD-10-CM | POA: Diagnosis not present

## 2022-02-14 DIAGNOSIS — Z7984 Long term (current) use of oral hypoglycemic drugs: Secondary | ICD-10-CM | POA: Diagnosis not present

## 2022-02-14 DIAGNOSIS — I10 Essential (primary) hypertension: Secondary | ICD-10-CM | POA: Diagnosis not present

## 2022-02-14 DIAGNOSIS — R2233 Localized swelling, mass and lump, upper limb, bilateral: Secondary | ICD-10-CM | POA: Diagnosis present

## 2022-02-14 DIAGNOSIS — M7989 Other specified soft tissue disorders: Secondary | ICD-10-CM

## 2022-02-14 DIAGNOSIS — Z8673 Personal history of transient ischemic attack (TIA), and cerebral infarction without residual deficits: Secondary | ICD-10-CM | POA: Diagnosis not present

## 2022-02-14 DIAGNOSIS — Z79899 Other long term (current) drug therapy: Secondary | ICD-10-CM | POA: Diagnosis not present

## 2022-02-14 NOTE — ED Triage Notes (Addendum)
Pt states she fell last year; c/o generalized pain, bilateral hand swelling since fall; pt arrives in c collar, states it helps with her neck pain; pt hypertensive in triage, states she took her meds today
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
## 2022-02-15 MED ORDER — ACETAMINOPHEN 500 MG PO TABS
1000.0000 mg | ORAL_TABLET | Freq: Once | ORAL | Status: AC
  Administered 2022-02-15: 1000 mg via ORAL
  Filled 2022-02-15: qty 2

## 2022-02-15 NOTE — ED Provider Notes (Signed)
[facility] Provider Note   CSN: [id number] Arrival date & time: 02/14/22  1653     History  No chief complaint on file.   Camy Leder is a 77 y.o. female.  HPI     This is a 77 year old female who presents with persistent bilateral hand swelling.  Patient has been seen and evaluated multiple times for hand swelling, pain, and wrist pain.  She was last seen on Monday.  Patient states that she takes Tylenol with minimal relief.  She has lidocaine patches in place as well.  She states that she sustained a fall approximately 1 month ago and swelling has been worse since that time.  She has had an extensive workup including x-rays that have been negative.  Denies new injury.  She is notably hypertensive.  Reports compliance with medication.  However, she does not have a primary physician to follow-up with.  Home Medications Prior to Admission medications   Medication Sig Start Date End Date Taking? Authorizing Provider  acetaminophen (TYLENOL) 325 MG tablet Take 2 tablets (650 mg total) by mouth every 6 (six) hours as needed for moderate pain, mild pain or fever. 02/12/22   Small, Brooke L, PA  albuterol (VENTOLIN HFA) 108 (90 Base) MCG/ACT inhaler Inhale 1-2 puffs into the lungs every 6 (six) hours as needed for wheezing or shortness of breath. 04/22/20   Vevelyn Francois, NP  chlorthalidone (HYGROTON) 25 MG tablet TAKE 1 TABLET (25 MG TOTAL) BY MOUTH DAILY. 11/20/21 11/20/22  Renee Rival, FNP  clopidogrel (PLAVIX) 75 MG tablet Take 1 tablet (75 mg total) by mouth daily. 06/15/21   Mitzi Hansen, MD  diclofenac Sodium (VOLTAREN) 1 % GEL Apply 2 g topically 4 (four) times daily. 01/09/22   Sherrye Payor A, PA-C  gabapentin (NEURONTIN) 600 MG tablet Take 0.5 tablets (300 mg total) by mouth daily. 10/27/21   Kommor, Madison, MD  lidocaine (LIDODERM) 5 % Place 1 patch onto the skin daily. Remove & Discard patch within 12 hours or as directed  by MD 01/03/22   Evlyn Courier, PA-C  losartan (COZAAR) 100 MG tablet Take 1 tablet (100 mg total) by mouth daily for 14 days. 09/08/21 09/22/21  Tretha Sciara, MD  losartan (COZAAR) 50 MG tablet Take 1 tablet (50 mg total) by mouth daily. 06/15/21 06/15/22  Mitzi Hansen, MD  metFORMIN (GLUCOPHAGE) 500 MG tablet Take 1 tablet (500 mg total) by mouth 2 (two) times daily with a meal. 09/05/21 09/05/22  Wynona Dove A, DO  pantoprazole (PROTONIX) 20 MG tablet Take 1 tablet (20 mg total) by mouth daily. 10/10/21   Isla Pence, MD  rosuvastatin (CRESTOR) 10 MG tablet Take 1 tablet (10 mg total) by mouth at bedtime. 06/15/21 06/15/22  Mitzi Hansen, MD      Allergies    Codeine, Ibuprofen, and Imdur [isosorbide nitrate]    Review of Systems   Review of Systems  Musculoskeletal:        Hand swelling  All other systems reviewed and are negative.   Physical Exam Updated Vital Signs BP (!) 189/68   Pulse 64   Temp 98.2 F (36.8 C) (Oral)   Resp 15   SpO2 100%  Physical Exam Vitals and nursing note reviewed.  Constitutional:      Appearance: She is well-developed.     Comments: Chronically ill-appearing, nontoxic  HENT:     Head: Normocephalic and atraumatic.     Mouth/Throat:  Mouth: Mucous membranes are moist.  Eyes:     Pupils: Pupils are equal, round, and reactive to light.  Neck:     Comments: C-collar in place Cardiovascular:     Rate and Rhythm: Normal rate and regular rhythm.     Heart sounds: Normal heart sounds.  Pulmonary:     Effort: Pulmonary effort is normal. No respiratory distress.  Abdominal:     Palpations: Abdomen is soft.  Musculoskeletal:     Cervical back: Neck supple.     Comments: Puffy swelling mostly over the dorsum of the bilateral hands extending and up to all 5 digits, limited flexion and extension of the digits secondary to swelling and pain, normal range of motion of the wrist, bilateral wrists with Ace bandages applied, these were removed, no  ascending swelling into the forearm or upper arm, 2+ radial pulses bilaterally  Skin:    General: Skin is warm and dry.  Neurological:     Mental Status: She is alert and oriented to person, place, and time.  Psychiatric:        Mood and Affect: Mood normal.     ED Results / Procedures / Treatments   Labs (all labs ordered are listed, but only abnormal results are displayed) Labs Reviewed - No data to display  EKG None  Radiology No results found.  Procedures Procedures    Medications Ordered in ED Medications  acetaminophen (TYLENOL) tablet 1,000 mg (has no administration in time range)    ED Course/ Medical Decision Making/ A&P                             Medical Decision Making Risk OTC drugs.   This patient presents to the ED for concern of bilateral hand swelling, this involves an extensive number of treatment options, and is a complaint that carries with it a high risk of complications and morbidity.  I considered the following differential and admission for this acute, potentially life threatening condition.  The differential diagnosis includes dependent edema, edema related to blood flow restriction secondary to Ace bandage, side effect of medication, third spacing  MDM:    This is a 77 year old female who presents with bilateral hand pain and swelling.  She is nontoxic and vital signs are notable for blood pressure of 189/68.  She has history of high blood pressure.  Reports compliance with medications.  She has had multiple visits for the same recently with negative workup including imaging.  She has Ace bandages applied bilaterally which are fairly tight.  I have removed these.  She is insistent that she needs these for stabilization of her wrist.  I discussed with her that I felt that they were likely constricting flow to her hands.  I discussed with her that she needs to keep these removed and keep her hands elevated.  She is on Norvasc for her blood pressure.   This can also have known side effects of isolated swelling.  She has no evidence of full body swelling or volume overload.  Will discontinue this medication.  Unfortunately she does not have a primary physician to follow-up with.  I have given her the phone number for [facility].  I discussed with her the importance of establishing care for blood pressure recheck and ongoing management of her chronic issues.  (Labs, imaging, consults)  Labs: I Ordered, and personally interpreted labs.  The pertinent results include: None  Imaging Studies  ordered: I ordered imaging studies including none I independently visualized and interpreted imaging. I agree with the radiologist interpretation  Additional history obtained from chart review.  External records from outside source obtained and reviewed including imaging  Cardiac Monitoring: The patient was maintained on a cardiac monitor.  I personally viewed and interpreted the cardiac monitored which showed an underlying rhythm of: Sinus rhythm  Reevaluation: After the interventions noted above, I reevaluated the patient and found that they have :stayed the same  Social Determinants of Health:  lives independently  Disposition: Discharge  Co morbidities that complicate the patient evaluation  Past Medical History:  Diagnosis Date   Diabetes mellitus without complication ([facility])    GERD 09/04/2006   Qualifier: Diagnosis of  By: Suzie Portela     History of echocardiogram    a. 2D ECHO: 11/06/2013 EF 65%; no WMA. Mild TR. PA pk pressure 43 mm HG   Hypertension    Stroke ([facility])      Medicines Meds ordered this encounter  Medications   acetaminophen (TYLENOL) tablet 1,000 mg    I have reviewed the patients home medicines and have made adjustments as needed  Problem List / ED Course: Problem List Items Addressed This Visit   None Visit Diagnoses     Bilateral hand swelling    -  Primary                   Final  Clinical Impression(s) / ED Diagnoses Final diagnoses:  Bilateral hand swelling    Rx / DC Orders ED Discharge Orders     None         Merryl Hacker, MD 02/15/22 0221

## 2022-02-15 NOTE — Discharge Instructions (Addendum)
You were seen today for continued bilateral hand swelling.  This is likely related to you applying an Ace wrap to your bilateral wrist.  Avoid wrapping your wrist.  Keep hands elevated.  You also take amlodipine.  This can sometimes cause swelling.  Discontinue this medication.  You were notably hypertensive.  It is importantly follow-up with your primary physician for blood pressure recheck.  I have given you information for [facility].

## 2022-02-21 ENCOUNTER — Encounter (HOSPITAL_COMMUNITY): Payer: Self-pay

## 2022-02-21 ENCOUNTER — Other Ambulatory Visit: Payer: Self-pay

## 2022-02-21 ENCOUNTER — Emergency Department (HOSPITAL_COMMUNITY)
Admission: EM | Admit: 2022-02-21 | Discharge: 2022-02-22 | Disposition: A | Discharge status: Home / Self Care | Payer: 59 | Attending: Emergency Medicine | Admitting: Emergency Medicine

## 2022-02-21 DIAGNOSIS — I10 Essential (primary) hypertension: Secondary | ICD-10-CM | POA: Diagnosis not present

## 2022-02-21 DIAGNOSIS — Z7902 Long term (current) use of antithrombotics/antiplatelets: Secondary | ICD-10-CM | POA: Insufficient documentation

## 2022-02-21 DIAGNOSIS — Z7984 Long term (current) use of oral hypoglycemic drugs: Secondary | ICD-10-CM | POA: Diagnosis not present

## 2022-02-21 DIAGNOSIS — E119 Type 2 diabetes mellitus without complications: Secondary | ICD-10-CM | POA: Insufficient documentation

## 2022-02-21 DIAGNOSIS — M79642 Pain in left hand: Secondary | ICD-10-CM | POA: Insufficient documentation

## 2022-02-21 DIAGNOSIS — M79641 Pain in right hand: Secondary | ICD-10-CM | POA: Insufficient documentation

## 2022-02-21 DIAGNOSIS — Z79899 Other long term (current) drug therapy: Secondary | ICD-10-CM | POA: Insufficient documentation

## 2022-02-21 DIAGNOSIS — M7989 Other specified soft tissue disorders: Secondary | ICD-10-CM | POA: Insufficient documentation

## 2022-02-21 NOTE — ED Triage Notes (Signed)
Pt came in via POV d/t bilateral hand pain, swelling noted in triage. Pt states it started form [place]. Rates her pain 10/10, A/Ox4.

## 2022-02-21 NOTE — ED Provider Triage Note (Signed)
Emergency Medicine Provider Triage Evaluation Note  [name] , a 77 y.o. female  was evaluated in triage.  Pt complains of bilateral hand pain and swelling.  Symptoms have been present for approximately 1 month when she fell on the bus.  She has been taking Tylenol for the pain but states that is not working.  Denies any acute changes.  Review of Systems  Positive: As above Negative: As above  Physical Exam  BP (!) 217/81 (BP Location: Right Arm)   Pulse 79   Temp 98.2 F (36.8 C) (Oral)   Resp 16   SpO2 97%  Gen:   Awake, no distress   Resp:  Normal effort  MSK:   Moves extremities without difficulty  Other:  Swelling to bilateral wrists and hands.  Inability to perform a closed fist.  Tenderness to palpation of bilateral hands  Medical Decision Making  Medically screening exam initiated at 6:09 PM.  Appropriate orders placed.  [name] was informed that the remainder of the evaluation will be completed by another provider, this initial triage assessment does not replace that evaluation, and the importance of remaining in the ED until their evaluation is complete.     Roylene Reason, [place] 02/21/22 1810

## 2022-02-22 MED ORDER — LIDOCAINE 5 % EX PTCH: 1.0000 | MEDICATED_PATCH | CUTANEOUS | 2 refills | Status: DC

## 2022-02-22 NOTE — ED Notes (Signed)
Pt refusing bilateral wrist braces, pt stating "they are too big and tight". Pt asking for "soft gloves", pt was told that there are no soft gloves we can provide.

## 2022-02-22 NOTE — ED Notes (Signed)
Wrist brace given to pt to take home

## 2022-02-22 NOTE — ED Notes (Signed)
Patient verbalizes understanding of d/c instructions. Opportunities for questions and answers were provided. Pt d/c from ED and ambulated to lobby.

## 2022-02-22 NOTE — ED Provider Notes (Signed)
[facility] Provider Note   CSN: [id number] Arrival date & time: 02/21/22  1752     History  Chief Complaint  Patient presents with   Bilateral Hand pain    [name] is a 77 y.o. female.  Patient with history of hypertension and diabetes presents today with complaints of bilateral hand swelling. States that same began several months ago when she [place] the city bus. She has been seen several times in the ED for similar complaints and has had laboratory evaluation and several repeat x-rays all of which have been unremarkable for acute findings. She has also been given numerous referrals to orthopedics and the wellness center but has not followed up up due to "the emergency department is closer to my house." Last visit was 6 days ago where she was told to discontinue her calcium channel blocker which could be contributing to her symptoms. She has not done this. She states she has been taking tylenol but it does not help her symptoms. She has lidocaine patches in place and states they help some but she is wearing the last ones she has. She denies any new injury or change in her condition. She unfortunately does not have a primary care doctor.  The history is provided by the patient. No language interpreter was used.       Home Medications Prior to Admission medications   Medication Sig Start Date End Date Taking? Authorizing Provider  acetaminophen (TYLENOL) 325 MG tablet Take 2 tablets (650 mg total) by mouth every 6 (six) hours as needed for moderate pain, mild pain or fever. 02/12/22   Small, Brooke L, PA  albuterol (VENTOLIN HFA) 108 (90 Base) MCG/ACT inhaler Inhale 1-2 puffs into the lungs every 6 (six) hours as needed for wheezing or shortness of breath. 04/22/20   Vevelyn Francois, NP  chlorthalidone (HYGROTON) 25 MG tablet TAKE 1 TABLET (25 MG TOTAL) BY MOUTH DAILY. 11/20/21 11/20/22  Renee Rival, FNP  clopidogrel (PLAVIX) 75 MG  tablet Take 1 tablet (75 mg total) by mouth daily. 06/15/21   Mitzi Hansen, MD  diclofenac Sodium (VOLTAREN) 1 % GEL Apply 2 g topically 4 (four) times daily. 01/09/22   Sherrye Payor A, PA-C  gabapentin (NEURONTIN) 600 MG tablet Take 0.5 tablets (300 mg total) by mouth daily. 10/27/21   Kommor, Madison, MD  lidocaine (LIDODERM) 5 % Place 1 patch onto the skin daily. Remove & Discard patch within 12 hours or as directed by MD 01/03/22   Evlyn Courier, PA-C  losartan (COZAAR) 100 MG tablet Take 1 tablet (100 mg total) by mouth daily for 14 days. 09/08/21 09/22/21  Tretha Sciara, MD  losartan (COZAAR) 50 MG tablet Take 1 tablet (50 mg total) by mouth daily. 06/15/21 06/15/22  Mitzi Hansen, MD  metFORMIN (GLUCOPHAGE) 500 MG tablet Take 1 tablet (500 mg total) by mouth 2 (two) times daily with a meal. 09/05/21 09/05/22  Wynona Dove A, DO  pantoprazole (PROTONIX) 20 MG tablet Take 1 tablet (20 mg total) by mouth daily. 10/10/21   Isla Pence, MD  rosuvastatin (CRESTOR) 10 MG tablet Take 1 tablet (10 mg total) by mouth at bedtime. 06/15/21 06/15/22  Mitzi Hansen, MD      Allergies    Codeine, Ibuprofen, and Imdur [isosorbide nitrate]    Review of Systems   Review of Systems  Musculoskeletal:  Positive for arthralgias.  All other systems reviewed and are negative.   Physical Exam Updated  Vital Signs BP (!) 155/78   Pulse 78   Temp 98 F (36.7 C)   Resp 18   SpO2 98%  Physical Exam Vitals and nursing note reviewed.  Constitutional:      General: She is not in acute distress.    Appearance: Normal appearance. She is normal weight. She is not ill-appearing, toxic-appearing or diaphoretic.  HENT:     Head: Normocephalic and atraumatic.  Neck:     Comments: C collar in place Cardiovascular:     Rate and Rhythm: Normal rate.  Pulmonary:     Effort: Pulmonary effort is normal. No respiratory distress.  Musculoskeletal:        General: Normal range of motion.     Cervical back:  Normal range of motion.     Comments: Non-pitting swelling noted over the dorsal aspect of bilateral hands and into all 5 fingers. No erythema or warmth, fluctuance or induration. ROM limited due to swelling and pain. Swelling does not extend into the forearm or upper arm. Sensation intact, capillary refill less than 2 seconds. Radial and ulnar pulses symmetrical and 2+ bilaterally.   No edema noted to bilateral lower extremities  Skin:    General: Skin is warm and dry.  Neurological:     General: No focal deficit present.     Mental Status: She is alert.  Psychiatric:        Mood and Affect: Mood normal.        Behavior: Behavior normal.     ED Results / Procedures / Treatments   Labs (all labs ordered are listed, but only abnormal results are displayed) Labs Reviewed - No data to display  EKG None  Radiology No results found.  Procedures Procedures    Medications Ordered in ED Medications - No data to display  ED Course/ Medical Decision Making/ A&P                             Medical Decision Making  Patient presents today with complaints of bilateral hand swelling for the past several months. She is afebrile, nontoxic-appearing, and in no acute distress with reassuring vital signs. Chart reviewed, patient with 28 ED visits in the past 6 months many of which were for similar symptoms. She has had several repeat x-ray and labs which have been unremarkable. She states that during her last visit she was told she had to remove her Ace wrap's and expresses frustration regarding this because it did help with her pain.  Upon chart review there was some concern that the wrapping was too tight and was constricting blood flow to her wrist. I have given her wrist braces today and given strict instructions to limit how tightly these are worn and to only wear them when she doing activities where she needs this support. She expressed understanding. I have also refilled her prescription  for lidocaine patches per her request. I have also re-emphasized to her to stop taking her amlodopine as it has a known side effect of swelling. She does not have any leg swelling, shortness of breath, or abdominal distention consistent with fluid overload. Doubt CHF or nephrotic syndrome. No erythema or warmth, doubt cellulitis, DVT, or septic arthritis. She was also given instructions for follow-up with the wellness center which she has not done yet. I have discussed with her the importance of RICE and close outpatient follow-up for management of her chronic issues.  Patient is understanding  and amenable with plan, educated on red flag symptoms of prompt immediate return.  Patient discharged in stable condition.    Final Clinical Impression(s) / ED Diagnoses Final diagnoses:  Bilateral hand swelling    Rx / DC Orders ED Discharge Orders          Ordered    lidocaine (LIDODERM) 5 %  Every 24 hours        02/22/22 0231          An After Visit Summary was printed and given to the patient.     Nestor Lewandowsky 02/22/22 0234    Orpah Greek, MD 02/22/22 [phone]

## 2022-02-22 NOTE — Discharge Instructions (Addendum)
I have given you wrist braces for you to wear as needed for management of your chronic pain.  Please only wear these when doing activities where you need wrist support.  Otherwise, please rest, ice, and elevate your hands to help with the swelling.  You may also apply Voltaren cream which she can get over-the-counter for management of your pain.  Have also refilled your lidocaine patches.  Additionally, it is extremely important for your overall health that you follow-up with outpatient specialist for management of your chronic conditions.  You are unable to establish care with a primary care doctor, please call the wellness center to schedule an appointment.  I have attached their information to this paperwork.  Return if development of any new or worsening symptoms.

## 2022-02-27 ENCOUNTER — Emergency Department (HOSPITAL_COMMUNITY)
Admission: EM | Admit: 2022-02-27 | Discharge: 2022-02-27 | Disposition: A | Discharge status: Home / Self Care | Payer: 59 | Attending: Emergency Medicine | Admitting: Emergency Medicine

## 2022-02-27 ENCOUNTER — Encounter (HOSPITAL_COMMUNITY): Payer: Self-pay

## 2022-02-27 ENCOUNTER — Other Ambulatory Visit: Payer: Self-pay

## 2022-02-27 DIAGNOSIS — Z7902 Long term (current) use of antithrombotics/antiplatelets: Secondary | ICD-10-CM | POA: Insufficient documentation

## 2022-02-27 DIAGNOSIS — E119 Type 2 diabetes mellitus without complications: Secondary | ICD-10-CM | POA: Diagnosis not present

## 2022-02-27 DIAGNOSIS — R600 Localized edema: Secondary | ICD-10-CM | POA: Insufficient documentation

## 2022-02-27 DIAGNOSIS — I10 Essential (primary) hypertension: Secondary | ICD-10-CM | POA: Diagnosis not present

## 2022-02-27 DIAGNOSIS — Z7984 Long term (current) use of oral hypoglycemic drugs: Secondary | ICD-10-CM | POA: Insufficient documentation

## 2022-02-27 DIAGNOSIS — M7989 Other specified soft tissue disorders: Secondary | ICD-10-CM

## 2022-02-27 DIAGNOSIS — Z79899 Other long term (current) drug therapy: Secondary | ICD-10-CM | POA: Insufficient documentation

## 2022-02-27 NOTE — ED Triage Notes (Signed)
Pt c/o hand swelling [id number] Pt has 2+ swelling of hands bilat. Pt has 2+ radial pulse bilat, cap refill less than 3 sec, 3/5 hand grip bilat, warm to touch. Pt came in working left wrist splint

## 2022-02-27 NOTE — Discharge Instructions (Addendum)
I have given you wrist braces for you to wear as needed for management of your chronic pain. Please only wear these when doing activities where you need wrist support. Otherwise, please rest, ice, and elevate your hands to help with the swelling. You may also apply Voltaren cream which she can get over-the-counter for management of your pain. Additionally, it is extremely important for your overall health that you follow-up with outpatient specialist for management of your chronic conditions. You are unable to establish care with a primary care doctor, please call the wellness center to schedule an appointment. I have attached their information to this paperwork.   Return if development of any new or worsening symptoms.

## 2022-02-27 NOTE — ED Provider Triage Note (Signed)
Emergency Medicine Provider Triage Evaluation Note  Glendy Barsanti , a 77 y.o. female  was evaluated in triage.  Pt complains of bilateral hand swelling. She has been seen here numerous times in the past with the last being on 02/21/22. No new traumas or falls.  Review of Systems  Positive:  Negative:   Physical Exam  BP (!) 226/89 (BP Location: Right Arm)   Pulse 66   Temp 97.9 F (36.6 C)   Resp 17   Ht 5\' 4"  (1.626 m)   Wt 63.5 kg   SpO2 99%   BMI 24.03 kg/m  Gen:   Awake, no distress   Resp:  Normal effort  MSK:   Moves extremities without difficulty  Other:  Bilateral hand swelling. No increase erythema or warmth. Palpable pulses.   Medical Decision Making  Medically screening exam initiated at 7:39 PM.  Appropriate orders placed.  Shawniece Oyola was informed that the remainder of the evaluation will be completed by another provider, this initial triage assessment does not replace that evaluation, and the importance of remaining in the ED until their evaluation is complete.     Sherrell Puller, [place] 02/27/22 0131
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
## 2022-02-28 ENCOUNTER — Other Ambulatory Visit: Payer: Self-pay | Admitting: Nurse Practitioner

## 2022-03-03 ENCOUNTER — Other Ambulatory Visit: Payer: Self-pay | Admitting: Nurse Practitioner

## 2022-03-04 ENCOUNTER — Other Ambulatory Visit: Payer: Self-pay

## 2022-03-04 ENCOUNTER — Emergency Department (HOSPITAL_COMMUNITY)
Admission: EM | Admit: 2022-03-04 | Discharge: 2022-03-04 | Disposition: A | Discharge status: Home / Self Care | Payer: 59 | Attending: Emergency Medicine | Admitting: Emergency Medicine

## 2022-03-04 ENCOUNTER — Encounter (HOSPITAL_COMMUNITY): Payer: Self-pay | Admitting: Emergency Medicine

## 2022-03-04 DIAGNOSIS — Z79899 Other long term (current) drug therapy: Secondary | ICD-10-CM | POA: Diagnosis not present

## 2022-03-04 DIAGNOSIS — Z7902 Long term (current) use of antithrombotics/antiplatelets: Secondary | ICD-10-CM | POA: Diagnosis not present

## 2022-03-04 DIAGNOSIS — Z7984 Long term (current) use of oral hypoglycemic drugs: Secondary | ICD-10-CM | POA: Diagnosis not present

## 2022-03-04 DIAGNOSIS — R2233 Localized swelling, mass and lump, upper limb, bilateral: Secondary | ICD-10-CM | POA: Insufficient documentation

## 2022-03-04 DIAGNOSIS — M7989 Other specified soft tissue disorders: Secondary | ICD-10-CM

## 2022-03-04 LAB — CBC WITH DIFFERENTIAL/PLATELET
Abs Immature Granulocytes: 0.02 10*3/uL (ref 0.00–0.07)
Basophils Absolute: 0 10*3/uL (ref 0.0–0.1)
Basophils Relative: 1 %
Eosinophils Absolute: 0.1 10*3/uL (ref 0.0–0.5)
Eosinophils Relative: 1 %
HCT: 30.8 % — ABNORMAL LOW (ref 36.0–46.0)
Hemoglobin: 9.6 g/dL — ABNORMAL LOW (ref 12.0–15.0)
Immature Granulocytes: 0 %
Lymphocytes Relative: 22 %
Lymphs Abs: 1.6 10*3/uL (ref 0.7–4.0)
MCH: 29.4 pg (ref 26.0–34.0)
MCHC: 31.2 g/dL (ref 30.0–36.0)
MCV: 94.5 fL (ref 80.0–100.0)
Monocytes Absolute: 0.6 10*3/uL (ref 0.1–1.0)
Monocytes Relative: 8 %
Neutro Abs: 5 10*3/uL (ref 1.7–7.7)
Neutrophils Relative %: 68 %
Platelets: 279 10*3/uL (ref 150–400)
RBC: 3.26 MIL/uL — ABNORMAL LOW (ref 3.87–5.11)
RDW: 12.8 % (ref 11.5–15.5)
WBC: 7.4 10*3/uL (ref 4.0–10.5)
nRBC: 0 % (ref 0.0–0.2)

## 2022-03-04 LAB — COMPREHENSIVE METABOLIC PANEL
ALT: 11 U/L (ref 0–44)
AST: 17 U/L (ref 15–41)
Albumin: 3.2 g/dL — ABNORMAL LOW (ref 3.5–5.0)
Alkaline Phosphatase: 66 U/L (ref 38–126)
Anion gap: 11 (ref 5–15)
BUN: 9 mg/dL (ref 8–23)
CO2: 24 mmol/L (ref 22–32)
Calcium: 8.6 mg/dL — ABNORMAL LOW (ref 8.9–10.3)
Chloride: 103 mmol/L (ref 98–111)
Creatinine, Ser: 0.93 mg/dL (ref 0.44–1.00)
GFR, Estimated: >60 mL/min (ref >60)
Glucose, Bld: 97 mg/dL (ref 70–99)
Potassium: 3.8 mmol/L (ref 3.5–5.1)
Sodium: 138 mmol/L (ref 135–145)
Total Bilirubin: 0.5 mg/dL (ref 0.3–1.2)
Total Protein: 7 g/dL (ref 6.5–8.1)

## 2022-03-04 LAB — SEDIMENTATION RATE: Sed Rate: 48 mm/hr — ABNORMAL HIGH (ref 0–22)

## 2022-03-04 LAB — C-REACTIVE PROTEIN: CRP: <0.5 mg/dL (ref <1.0)

## 2022-03-04 MED ORDER — LOSARTAN POTASSIUM 50 MG PO TABS
50.0000 mg | ORAL_TABLET | Freq: Once | ORAL | Status: AC
  Administered 2022-03-04: 50 mg via ORAL
  Filled 2022-03-04: qty 1

## 2022-03-04 MED ORDER — OXYCODONE HCL 5 MG PO TABS
5.0000 mg | ORAL_TABLET | Freq: Once | ORAL | Status: AC
  Administered 2022-03-04: 5 mg via ORAL
  Filled 2022-03-04: qty 1

## 2022-03-04 NOTE — Discharge Instructions (Addendum)
You were seen in the emergency department today for your hand swelling.  While in the emergency department we completed a full history and physical exam.  Based off of our findings at this time we feel that you are suitable for home discharge.  We feel that you are suffering from bilateral hand swelling secondary to chronic splint usage.  Please refrain from wrapping your wrist tightly as this will make the swelling worse.  Please continue taking your home blood pressure medications to help manage your blood pressures.  You may take Tylenol and ibuprofen as needed.  Please follow-up with your previously ordered orthopedic follow-up appointment.  Sincerely Clydie Braun, PGY 2

## 2022-03-04 NOTE — ED Triage Notes (Signed)
Pt here from home with c/o chronic swollen hand pain requesting pain meds , pt was seen for same on the 6th

## 2022-03-04 NOTE — ED Provider Notes (Signed)
[facility] Provider Note   CSN: [id number] Arrival date & time: 03/04/22  1616     History {Add pertinent medical, surgical, social history, OB history to HPI:1} No chief complaint on file.   [name] is a 77 y.o. female.  [place] Medications Prior to Admission medications   Medication Sig Start Date End Date Taking? Authorizing Provider  acetaminophen (TYLENOL) 325 MG tablet Take 2 tablets (650 mg total) by mouth every 6 (six) hours as needed for moderate pain, mild pain or fever. 02/12/22   Small, Brooke L, PA  albuterol (VENTOLIN HFA) 108 (90 Base) MCG/ACT inhaler Inhale 1-2 puffs into the lungs every 6 (six) hours as needed for wheezing or shortness of breath. 04/22/20   Vevelyn Francois, NP  chlorthalidone (HYGROTON) 25 MG tablet TAKE 1 TABLET (25 MG TOTAL) BY MOUTH DAILY. 11/20/21 11/20/22  Renee Rival, FNP  clopidogrel (PLAVIX) 75 MG tablet Take 1 tablet (75 mg total) by mouth daily. 06/15/21   Mitzi Hansen, MD  diclofenac Sodium (VOLTAREN) 1 % GEL Apply 2 g topically 4 (four) times daily. 01/09/22   Sherrye Payor A, PA-C  gabapentin (NEURONTIN) 600 MG tablet Take 0.5 tablets (300 mg total) by mouth daily. 10/27/21   Kommor, Madison, MD  lidocaine (LIDODERM) 5 % Place 1 patch onto the skin daily. Remove & Discard patch within 12 hours or as directed by MD 02/22/22   Smoot, Leary Roca, PA-C  losartan (COZAAR) 100 MG tablet Take 1 tablet (100 mg total) by mouth daily for 14 days. 09/08/21 09/22/21  Tretha Sciara, MD  losartan (COZAAR) 50 MG tablet Take 1 tablet (50 mg total) by mouth daily. 06/15/21 06/15/22  Mitzi Hansen, MD  metFORMIN (GLUCOPHAGE) 500 MG tablet Take 1 tablet (500 mg total) by mouth 2 (two) times daily with a meal. 09/05/21 09/05/22  Wynona Dove A, DO  pantoprazole (PROTONIX) 20 MG tablet Take 1 tablet (20 mg total) by mouth daily. 10/10/21   Isla Pence, MD  rosuvastatin (CRESTOR) 10  MG tablet Take 1 tablet (10 mg total) by mouth at bedtime. 06/15/21 06/15/22  Mitzi Hansen, MD      Allergies    Codeine, Ibuprofen, and Imdur [isosorbide nitrate]    Review of Systems   Review of Systems  Physical Exam Updated Vital Signs BP (!) 204/75   Pulse 70   Temp 97.8 F (36.6 C)   Resp 18   SpO2 100%  Physical Exam  ED Results / Procedures / Treatments   Labs (all labs ordered are listed, but only abnormal results are displayed) Labs Reviewed - No data to display  EKG None  Radiology No results found.  Procedures Procedures  {Document cardiac monitor, telemetry assessment procedure when appropriate:1}  Medications Ordered in ED Medications - No data to display  ED Course/ Medical Decision Making/ A&P   {   Click here for ABCD2, HEART and other calculatorsREFRESH Note before signing :1}                          Medical Decision Making Amount and/or Complexity of Data Reviewed Labs: ordered.  Risk Prescription drug management.   ***  {Document critical care time when appropriate:1} {Document review of labs and clinical decision tools ie heart score, Chads2Vasc2 etc:1}  {Document your independent review of radiology images, and any outside records:1} {Document your discussion with family  members, caretakers, and with consultants:1} {Document social determinants of health affecting pt's care:1} {Document your decision making why or why not admission, treatments were needed:1} Final Clinical Impression(s) / ED Diagnoses Final diagnoses:  None    Rx / DC Orders ED Discharge Orders     None

## 2022-03-06 ENCOUNTER — Encounter (HOSPITAL_COMMUNITY): Payer: Self-pay

## 2022-03-06 ENCOUNTER — Emergency Department (HOSPITAL_COMMUNITY)
Admission: EM | Admit: 2022-03-06 | Discharge: 2022-03-06 | Disposition: A | Discharge status: Home / Self Care | Payer: 59 | Attending: Emergency Medicine | Admitting: Emergency Medicine

## 2022-03-06 ENCOUNTER — Other Ambulatory Visit: Payer: Self-pay

## 2022-03-06 ENCOUNTER — Encounter (HOSPITAL_COMMUNITY): Payer: Self-pay | Admitting: Emergency Medicine

## 2022-03-06 DIAGNOSIS — R2233 Localized swelling, mass and lump, upper limb, bilateral: Secondary | ICD-10-CM | POA: Insufficient documentation

## 2022-03-06 DIAGNOSIS — I10 Essential (primary) hypertension: Secondary | ICD-10-CM | POA: Insufficient documentation

## 2022-03-06 DIAGNOSIS — Z79899 Other long term (current) drug therapy: Secondary | ICD-10-CM | POA: Diagnosis not present

## 2022-03-06 DIAGNOSIS — Z7984 Long term (current) use of oral hypoglycemic drugs: Secondary | ICD-10-CM | POA: Insufficient documentation

## 2022-03-06 DIAGNOSIS — Z7902 Long term (current) use of antithrombotics/antiplatelets: Secondary | ICD-10-CM | POA: Diagnosis not present

## 2022-03-06 DIAGNOSIS — E119 Type 2 diabetes mellitus without complications: Secondary | ICD-10-CM | POA: Insufficient documentation

## 2022-03-06 DIAGNOSIS — M7989 Other specified soft tissue disorders: Secondary | ICD-10-CM

## 2022-03-06 MED ORDER — HYDROCODONE-ACETAMINOPHEN 5-325 MG PO TABS
1.0000 | ORAL_TABLET | Freq: Once | ORAL | Status: AC
  Administered 2022-03-06: 1 via ORAL
  Filled 2022-03-06: qty 1

## 2022-03-06 MED ORDER — ACETAMINOPHEN 325 MG PO TABS
650.0000 mg | ORAL_TABLET | Freq: Once | ORAL | Status: AC
  Administered 2022-03-06: 650 mg via ORAL
  Filled 2022-03-06: qty 2

## 2022-03-06 MED ORDER — LOSARTAN POTASSIUM 50 MG PO TABS
50.0000 mg | ORAL_TABLET | ORAL | Status: AC
  Administered 2022-03-06: 50 mg via ORAL
  Filled 2022-03-06: qty 1

## 2022-03-06 NOTE — ED Provider Notes (Signed)
[facility] Provider Note   CSN: [id number] Arrival date & time: 03/06/22  1952     History  Chief Complaint  Patient presents with   Hand Pain    [name] is a 77 y.o. female with medical history of hypertension and diabetes.  Patient comes in with complaint of bilateral hand swelling and pain.  Patient reports symptoms began several months ago [place] city [place].  Patient has been seen in the ED numerous times for the same complaint and had multiple imaging studies done.  Patient has been given numerous referrals to orthopedics and the wellness center however the patient has not followed up due to the fact that "[facility] is closer, I do not have time for the rip and run".  Patient has been seen 29 times in the past 6 months.  Patient has been seen 8 times since the new year for the same complaint.  Patient was actually seen earlier today and discharged from department after receiving Tylenol.  Patient returns complaining of continued hand pain and swelling.  Patient denies any new features to hand pain or swelling.  Patient denies any trauma to account for the pain and swelling.   Hand Pain       Home Medications Prior to Admission medications   Medication Sig Start Date End Date Taking? Authorizing Provider  acetaminophen (TYLENOL) 325 MG tablet Take 2 tablets (650 mg total) by mouth every 6 (six) hours as needed for moderate pain, mild pain or fever. 02/12/22   Small, Brooke L, PA  albuterol (VENTOLIN HFA) 108 (90 Base) MCG/ACT inhaler Inhale 1-2 puffs into the lungs every 6 (six) hours as needed for wheezing or shortness of breath. 04/22/20   Vevelyn Francois, NP  chlorthalidone (HYGROTON) 25 MG tablet TAKE 1 TABLET (25 MG TOTAL) BY MOUTH DAILY. 11/20/21 11/20/22  Renee Rival, FNP  clopidogrel (PLAVIX) 75 MG tablet Take 1 tablet (75 mg total) by mouth daily. 06/15/21   Mitzi Hansen, MD  diclofenac Sodium  (VOLTAREN) 1 % GEL Apply 2 g topically 4 (four) times daily. 01/09/22   Sherrye Payor A, PA-C  gabapentin (NEURONTIN) 600 MG tablet Take 0.5 tablets (300 mg total) by mouth daily. 10/27/21   Kommor, Madison, MD  lidocaine (LIDODERM) 5 % Place 1 patch onto the skin daily. Remove & Discard patch within 12 hours or as directed by MD 02/22/22   Smoot, Leary Roca, PA-C  losartan (COZAAR) 100 MG tablet Take 1 tablet (100 mg total) by mouth daily for 14 days. 09/08/21 09/22/21  Tretha Sciara, MD  losartan (COZAAR) 50 MG tablet Take 1 tablet (50 mg total) by mouth daily. 06/15/21 06/15/22  Mitzi Hansen, MD  metFORMIN (GLUCOPHAGE) 500 MG tablet Take 1 tablet (500 mg total) by mouth 2 (two) times daily with a meal. 09/05/21 09/05/22  Wynona Dove A, DO  pantoprazole (PROTONIX) 20 MG tablet Take 1 tablet (20 mg total) by mouth daily. 10/10/21   Isla Pence, MD  rosuvastatin (CRESTOR) 10 MG tablet Take 1 tablet (10 mg total) by mouth at bedtime. 06/15/21 06/15/22  Mitzi Hansen, MD      Allergies    Codeine, Ibuprofen, and Imdur [isosorbide nitrate]    Review of Systems   Review of Systems  Musculoskeletal:  Positive for arthralgias.  All other systems reviewed and are negative.   Physical Exam Updated Vital Signs BP (!) 185/72   Pulse 75   Temp  81 F (36.7 C)   Resp 17   Ht 5' 4"$  (1.626 m)   Wt 63.5 kg   SpO2 98%   BMI 24.03 kg/m  Physical Exam Vitals and nursing note reviewed.  Constitutional:      General: She is not in acute distress.    Appearance: Normal appearance. She is not ill-appearing, toxic-appearing or diaphoretic.  HENT:     Head: Normocephalic and atraumatic.     Nose: Nose normal.     Mouth/Throat:     Mouth: Mucous membranes are moist.     Pharynx: Oropharynx is clear.  Eyes:     Extraocular Movements: Extraocular movements intact.     Conjunctiva/sclera: Conjunctivae normal.     Pupils: Pupils are equal, round, and reactive to light.  Cardiovascular:     Rate  and Rhythm: Normal rate and regular rhythm.  Pulmonary:     Effort: Pulmonary effort is normal.     Breath sounds: Normal breath sounds. No wheezing.  Abdominal:     General: Abdomen is flat. Bowel sounds are normal.     Palpations: Abdomen is soft.     Tenderness: There is no abdominal tenderness.  Musculoskeletal:     Cervical back: Normal range of motion and neck supple. No tenderness.     Comments: Non-pitting edema noted to the the dorsal aspect of the bilateral hands and going into all fingers and thumb. There is no increase in warmth or erythema. Her ROM is limited 2/2 pain and swelling. Cap refill < 2 seconds. Palpable radial and ulnar pulses. I do not appreciate any overlying skin changes such as erythema, rash, petechiae, abrasions, or bruising. No fluctuance or induration. Swelling is only present in hands and is not involved proximally.   Skin:    General: Skin is warm and dry.     Capillary Refill: Capillary refill takes less than 2 seconds.  Neurological:     Mental Status: She is alert and oriented to person, place, and time.     ED Results / Procedures / Treatments   Labs (all labs ordered are listed, but only abnormal results are displayed) Labs Reviewed - No data to display  EKG None  Radiology No results found.  Procedures Procedures   Medications Ordered in ED Medications  losartan (COZAAR) tablet 50 mg (50 mg Oral Given 03/06/22 2237)  HYDROcodone-acetaminophen (NORCO/VICODIN) 5-325 MG per tablet 1 tablet (1 tablet Oral Given 03/06/22 2236)    ED Course/ Medical Decision Making/ A&P  Medical Decision Making Risk Prescription drug management.   77 year old female here for chronic bilateral hand swelling.  Please see HPI for further details.  On examination patient is afebrile, nontachycardic.  Lung sounds are clear bilaterally, she is not hypoxic on room air.  Abdomen soft and compressible throughout.  Patient bilateral hands do have appreciable  swelling however no evidence of septic arthritis, overlying skin change.  Patient has been seen in the ED numerous times for the same complaint.  On 2/11 patient had inflammatory markers pulled to include ESR, CRP.  Sedimentation rate was elevated to 48 however on chart review the patient sedimentation rate appears to be chronically elevated going back 13 years.  Patient CRP was unremarkable.  Patient denies any new features to her hand pain, reports that she is just in pain.  The patient has been referred to orthopedics numerous times as well as the wellness center.  Patient reports that due to distance to orthopedics, she would prefer to keep  being seen at [facility].  I explained to the patient that in terms of management of chronic bilateral hand swelling she will be much better suited being seen by orthopedics.  The patient continues to state that due to distance to [facility] she will continue to come here.  Patient requesting pain medication.  Patient provided 5 mg hydrocodone as well as 50 mg Cozaar for hypertension.  Patient denies any headache, blurred vision, chest pain or shortness of breath.  The patient be discharged home at this time and advised to follow-up with orthopedics as previously referred.  Patient provided return precautions and she voiced understanding.  Patient had all of her questions answered to her satisfaction.  Patient stable for discharge.   Final Clinical Impression(s) / ED Diagnoses Final diagnoses:  Swelling of both hands    Rx / DC Orders ED Discharge Orders     None         Azucena Cecil, PA-C 03/06/22 [place], DO 03/06/22 2302

## 2022-03-06 NOTE — ED Provider Triage Note (Signed)
Emergency Medicine Provider Triage Evaluation Note  [name] , a 77 y.o. female  was evaluated in triage.  Pt complains of BL hand pain and swelling .  Patient seen multiple times for the same including 2 days ago in the emergency department.  Review of Systems  Positive: Bilateral hand swelling Negative: Changes from previous examination  Physical Exam  BP (!) 187/74 (BP Location: Right Arm)   Pulse 76   Temp 98.6 F (37 C) (Oral)   Resp 16   SpO2 96%  Gen:   Awake, no distress   Resp:  Normal effort  MSK:   Moves extremities without difficulty  Other:  BL hand swelling  Medical Decision Making  Medically screening exam initiated at 11:52 AM.  Appropriate orders placed.  [name] was informed that the remainder of the evaluation will be completed by another provider, this initial triage assessment does not replace that evaluation, and the importance of remaining in the ED until their evaluation is complete.  Question if patient may be having compression of venous return due to the chronic use of her [place] collar ?   [place], [place], PA-C 03/06/22 1153

## 2022-03-06 NOTE — ED Provider Triage Note (Addendum)
Emergency Medicine Provider Triage Evaluation Note  [name] , a 77 y.o. female  was evaluated in triage.  Pt complains of BL hand pain and swelling .  Patient seen multiple times for the same including earlier today in the emergency department.  Pt states she was not seen earlier today and came from home.  Review of Systems  Positive:  Negative: See above  Physical Exam  BP (!) 185/72   Pulse 75   Temp 98 F (36.7 C)   Resp 17   Ht 5' 4"$  (1.626 m)   Wt 63.5 kg   SpO2 98%   BMI 24.03 kg/m  Gen:   Awake, no distress   Resp:  Normal effort  MSK:   Moves extremities without difficulty  Other:  BIL hand swelling.  Medical Decision Making  Medically screening exam initiated at 8:26 PM.  Appropriate orders placed.  [name] was informed that the remainder of the evaluation will be completed by another provider, this initial triage assessment does not replace that evaluation, and the importance of remaining in the ED until their evaluation is complete.     Prince Rome, PA-C [id number]

## 2022-03-06 NOTE — Discharge Instructions (Addendum)
Return to the ED with any new or worsening signs or symptoms Please follow-up with orthopedics as previously referred. You may continue taking ibuprofen or Tylenol for pain in hands

## 2022-03-06 NOTE — Discharge Instructions (Signed)
You were seen in the emergency department for bilateral hand swelling. You have previously been referred to orthopedics and the wellness center for further evaluation so I encourage you to utilize these resources.

## 2022-03-06 NOTE — ED Triage Notes (Signed)
Pt c/o chronic bilateral hand swelling and requesting pain medication.

## 2022-03-06 NOTE — ED Notes (Signed)
Pt has 2+ swelling of hands bilat. Pt has 2+ radial pulse bilat, cap refill less than 3 sec, warm to touch. Pt unable to make a fist due to pain and swelling
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
## 2022-03-11 ENCOUNTER — Emergency Department (HOSPITAL_COMMUNITY)
Admission: EM | Admit: 2022-03-11 | Discharge: 2022-03-11 | Disposition: A | Discharge status: Home / Self Care | Payer: 59 | Attending: Emergency Medicine | Admitting: Emergency Medicine

## 2022-03-11 ENCOUNTER — Encounter (HOSPITAL_COMMUNITY): Payer: Self-pay | Admitting: Emergency Medicine

## 2022-03-11 DIAGNOSIS — G8929 Other chronic pain: Secondary | ICD-10-CM | POA: Insufficient documentation

## 2022-03-11 DIAGNOSIS — M79642 Pain in left hand: Secondary | ICD-10-CM | POA: Diagnosis present

## 2022-03-11 DIAGNOSIS — M542 Cervicalgia: Secondary | ICD-10-CM | POA: Diagnosis not present

## 2022-03-11 DIAGNOSIS — Z79899 Other long term (current) drug therapy: Secondary | ICD-10-CM | POA: Diagnosis not present

## 2022-03-11 DIAGNOSIS — I10 Essential (primary) hypertension: Secondary | ICD-10-CM | POA: Insufficient documentation

## 2022-03-11 DIAGNOSIS — W208XXA Other cause of strike by thrown, projected or falling object, initial encounter: Secondary | ICD-10-CM | POA: Diagnosis not present

## 2022-03-11 DIAGNOSIS — Z7902 Long term (current) use of antithrombotics/antiplatelets: Secondary | ICD-10-CM | POA: Diagnosis not present

## 2022-03-11 DIAGNOSIS — M79641 Pain in right hand: Secondary | ICD-10-CM | POA: Insufficient documentation

## 2022-03-11 DIAGNOSIS — Y92811 Bus as the place of occurrence of the external cause: Secondary | ICD-10-CM | POA: Diagnosis not present

## 2022-03-11 MED ORDER — GABAPENTIN 300 MG PO CAPS
300.0000 mg | ORAL_CAPSULE | ORAL | Status: AC
  Administered 2022-03-11: 300 mg via ORAL
  Filled 2022-03-11: qty 1

## 2022-03-11 MED ORDER — HYDRALAZINE HCL 25 MG PO TABS
25.0000 mg | ORAL_TABLET | Freq: Once | ORAL | Status: AC
  Administered 2022-03-11: 25 mg via ORAL
  Filled 2022-03-11: qty 1
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
## 2022-03-11 NOTE — ED Provider Notes (Signed)
[facility] Provider Note   CSN: [id number] Arrival date & time: 03/11/22  1713     History {Add pertinent medical, surgical, social history, OB history to HPI:1} Chief Complaint  Patient presents with   Hand Pain    [name] is a 77 y.o. female.   Hand Pain       Home Medications Prior to Admission medications   Medication Sig Start Date End Date Taking? Authorizing Provider  acetaminophen (TYLENOL) 325 MG tablet Take 2 tablets (650 mg total) by mouth every 6 (six) hours as needed for moderate pain, mild pain or fever. 02/12/22   Small, Brooke L, PA  albuterol (VENTOLIN HFA) 108 (90 Base) MCG/ACT inhaler Inhale 1-2 puffs into the lungs every 6 (six) hours as needed for wheezing or shortness of breath. 04/22/20   Vevelyn Francois, NP  chlorthalidone (HYGROTON) 25 MG tablet TAKE 1 TABLET (25 MG TOTAL) BY MOUTH DAILY. 11/20/21 11/20/22  Renee Rival, FNP  clopidogrel (PLAVIX) 75 MG tablet Take 1 tablet (75 mg total) by mouth daily. 06/15/21   Mitzi Hansen, MD  diclofenac Sodium (VOLTAREN) 1 % GEL Apply 2 g topically 4 (four) times daily. 01/09/22   Sherrye Payor A, PA-C  gabapentin (NEURONTIN) 600 MG tablet Take 0.5 tablets (300 mg total) by mouth daily. 10/27/21   Kommor, Madison, MD  lidocaine (LIDODERM) 5 % Place 1 patch onto the skin daily. Remove & Discard patch within 12 hours or as directed by MD 02/22/22   Smoot, Leary Roca, PA-C  losartan (COZAAR) 100 MG tablet Take 1 tablet (100 mg total) by mouth daily for 14 days. 09/08/21 09/22/21  Tretha Sciara, MD  losartan (COZAAR) 50 MG tablet Take 1 tablet (50 mg total) by mouth daily. 06/15/21 06/15/22  Mitzi Hansen, MD  metFORMIN (GLUCOPHAGE) 500 MG tablet Take 1 tablet (500 mg total) by mouth 2 (two) times daily with a meal. 09/05/21 09/05/22  Wynona Dove A, DO  pantoprazole (PROTONIX) 20 MG tablet Take 1 tablet (20 mg total) by mouth daily. 10/10/21   Isla Pence, MD   rosuvastatin (CRESTOR) 10 MG tablet Take 1 tablet (10 mg total) by mouth at bedtime. 06/15/21 06/15/22  Mitzi Hansen, MD      Allergies    Codeine, Ibuprofen, and Imdur [isosorbide nitrate]    Review of Systems   Review of Systems  Physical Exam Updated Vital Signs BP (!) 230/72 (BP Location: Right Arm)   Pulse 76   Temp 97.8 F (36.6 C) (Oral)   Resp 14   SpO2 100%  Physical Exam  ED Results / Procedures / Treatments   Labs (all labs ordered are listed, but only abnormal results are displayed) Labs Reviewed - No data to display  EKG None  Radiology No results found.  Procedures Procedures  {Document cardiac monitor, telemetry assessment procedure when appropriate:1}  Medications Ordered in ED Medications  hydrALAZINE (APRESOLINE) tablet 25 mg (has no administration in time range)  gabapentin (NEURONTIN) capsule 300 mg (has no administration in time range)    ED Course/ Medical Decision Making/ A&P   {   Click here for ABCD2, HEART and other calculatorsREFRESH Note before signing :1}                          Medical Decision Making Risk Prescription drug management.   ***  {Document critical care time when appropriate:1} {Document review of labs and clinical decision  tools ie heart score, Chads2Vasc2 etc:1}  {Document your independent review of radiology images, and any outside records:1} {Document your discussion with family members, caretakers, and with consultants:1} {Document social determinants of health affecting pt's care:1} {Document your decision making why or why not admission, treatments were needed:1} Final Clinical Impression(s) / ED Diagnoses Final diagnoses:  None    Rx / DC Orders ED Discharge Orders     None

## 2022-03-11 NOTE — Discharge Instructions (Signed)
Please take your prescribed hypertension medicine.  Follow-up with your primary care doctor

## 2022-03-11 NOTE — ED Triage Notes (Signed)
Pt here with c/o chronic hand pain

## 2022-03-12 ENCOUNTER — Telehealth: Payer: Self-pay | Admitting: *Deleted

## 2022-03-12 NOTE — Telephone Encounter (Signed)
Pt called regarding pain Rx.  Pt states she was supposed to get pain Rx from last ED visit.  RNCM reviewed chart and did not find pain Rx written for home use.  Relayed information to pt.

## 2022-03-18 ENCOUNTER — Emergency Department (HOSPITAL_COMMUNITY)
Admission: EM | Admit: 2022-03-18 | Discharge: 2022-03-18 | Disposition: A | Discharge status: Home / Self Care | Payer: 59 | Attending: Emergency Medicine | Admitting: Emergency Medicine

## 2022-03-18 ENCOUNTER — Encounter (HOSPITAL_COMMUNITY): Payer: Self-pay

## 2022-03-18 ENCOUNTER — Other Ambulatory Visit: Payer: Self-pay

## 2022-03-18 DIAGNOSIS — W19XXXA Unspecified fall, initial encounter: Secondary | ICD-10-CM | POA: Diagnosis not present

## 2022-03-18 DIAGNOSIS — M79641 Pain in right hand: Secondary | ICD-10-CM | POA: Insufficient documentation

## 2022-03-18 DIAGNOSIS — M79642 Pain in left hand: Secondary | ICD-10-CM | POA: Insufficient documentation

## 2022-03-18 DIAGNOSIS — Z7901 Long term (current) use of anticoagulants: Secondary | ICD-10-CM | POA: Diagnosis not present

## 2022-03-18 DIAGNOSIS — I10 Essential (primary) hypertension: Secondary | ICD-10-CM | POA: Insufficient documentation

## 2022-03-18 DIAGNOSIS — E119 Type 2 diabetes mellitus without complications: Secondary | ICD-10-CM | POA: Diagnosis not present

## 2022-03-18 DIAGNOSIS — Z8673 Personal history of transient ischemic attack (TIA), and cerebral infarction without residual deficits: Secondary | ICD-10-CM | POA: Insufficient documentation

## 2022-03-18 MED ORDER — GABAPENTIN 300 MG PO CAPS
300.0000 mg | ORAL_CAPSULE | Freq: Once | ORAL | Status: AC
  Administered 2022-03-18: 300 mg via ORAL
  Filled 2022-03-18: qty 1

## 2022-03-18 MED ORDER — ACETAMINOPHEN 500 MG PO TABS
1000.0000 mg | ORAL_TABLET | Freq: Once | ORAL | Status: AC
  Administered 2022-03-18: 1000 mg via ORAL
  Filled 2022-03-18: qty 2

## 2022-03-18 MED ORDER — LOSARTAN POTASSIUM 50 MG PO TABS
50.0000 mg | ORAL_TABLET | Freq: Once | ORAL | Status: AC
  Administered 2022-03-18: 50 mg via ORAL
  Filled 2022-03-18: qty 1
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
## 2022-03-18 NOTE — Discharge Instructions (Signed)
You were seen for hand pain.  We have referred you to orthopedics to be evaluated.  Please follow-up on this as that is the appropriate avenue to help definitively treat your symptoms.  Please return to the emergency department for significantly worsening symptoms or other emergencies.

## 2022-03-18 NOTE — ED Triage Notes (Signed)
Pt c/o bilat hand pain that she has been here several times for. Pt has 2+ edema of hand bilat

## 2022-03-19 ENCOUNTER — Emergency Department (HOSPITAL_COMMUNITY)
Admission: EM | Admit: 2022-03-19 | Discharge: 2022-03-19 | Disposition: A | Discharge status: Home / Self Care | Payer: 59 | Attending: Emergency Medicine | Admitting: Emergency Medicine

## 2022-03-19 DIAGNOSIS — Z7902 Long term (current) use of antithrombotics/antiplatelets: Secondary | ICD-10-CM | POA: Diagnosis not present

## 2022-03-19 DIAGNOSIS — I10 Essential (primary) hypertension: Secondary | ICD-10-CM | POA: Diagnosis not present

## 2022-03-19 DIAGNOSIS — E119 Type 2 diabetes mellitus without complications: Secondary | ICD-10-CM | POA: Insufficient documentation

## 2022-03-19 DIAGNOSIS — M79642 Pain in left hand: Secondary | ICD-10-CM | POA: Diagnosis not present

## 2022-03-19 DIAGNOSIS — Z79899 Other long term (current) drug therapy: Secondary | ICD-10-CM | POA: Diagnosis not present

## 2022-03-19 DIAGNOSIS — M79641 Pain in right hand: Secondary | ICD-10-CM | POA: Insufficient documentation

## 2022-03-19 DIAGNOSIS — Z8673 Personal history of transient ischemic attack (TIA), and cerebral infarction without residual deficits: Secondary | ICD-10-CM | POA: Insufficient documentation

## 2022-03-19 DIAGNOSIS — G8929 Other chronic pain: Secondary | ICD-10-CM | POA: Diagnosis not present

## 2022-03-19 DIAGNOSIS — Z7984 Long term (current) use of oral hypoglycemic drugs: Secondary | ICD-10-CM | POA: Diagnosis not present

## 2022-03-19 MED ORDER — KETOROLAC TROMETHAMINE 30 MG/ML IJ SOLN: 30.0000 mg | Freq: Once | INTRAMUSCULAR | Status: DC

## 2022-03-19 MED ORDER — GABAPENTIN 300 MG PO CAPS: 300.0000 mg | ORAL_CAPSULE | Freq: Three times a day (TID) | ORAL | 0 refills | Status: DC
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
## 2022-03-19 NOTE — ED Provider Triage Note (Signed)
Emergency Medicine Provider Triage Evaluation Note  [name] , [name] 77 y.o. female  was evaluated in triage.  Pt complains of concerns for bilateral hand pain.  Patient has been seen multiple times this year for similar concerns.  Notes that her symptoms are similar to when she was evaluated yesterday.  Denies any new injury.  Denies chest pain.  Has tried Tylenol at home..  Review of Systems  Positive:  Negative:   Physical Exam  BP (!) 188/74   Pulse 77   Temp 99 F (37.2 C) (Oral)   Resp 16   SpO2 99%  Gen:   Awake, no distress   Resp:  Normal effort  MSK:   Moves extremities without difficulty  Other:  Brace noted to left wrist.  Puffiness and swelling noted to right hand without appreciable tenderness to palpation or overlying skin changes.  Medical Decision Making  Medically screening exam initiated at 6:50 PM.  Appropriate orders placed.  [name] was informed that the remainder of the evaluation will be completed by another provider, this initial triage assessment does not replace that evaluation, and the importance of remaining in the ED until their evaluation is complete.  Workup initiated   Adriana Virgil A, PA-C 03/19/22 1851

## 2022-03-19 NOTE — ED Provider Notes (Signed)
[facility] Provider Note   CSN: [id number] Arrival date & time: 03/19/22  1844     History {Add pertinent medical, surgical, social history, OB history to HPI:1} Chief Complaint  Patient presents with   Hand Pain    Veya Pineo is a 77 y.o. female.  HPI     Home Medications Prior to Admission medications   Medication Sig Start Date End Date Taking? Authorizing Provider  acetaminophen (TYLENOL) 325 MG tablet Take 2 tablets (650 mg total) by mouth every 6 (six) hours as needed for moderate pain, mild pain or fever. 02/12/22   Small, Brooke L, PA  albuterol (VENTOLIN HFA) 108 (90 Base) MCG/ACT inhaler Inhale 1-2 puffs into the lungs every 6 (six) hours as needed for wheezing or shortness of breath. 04/22/20   Vevelyn Francois, NP  chlorthalidone (HYGROTON) 25 MG tablet TAKE 1 TABLET (25 MG TOTAL) BY MOUTH DAILY. 11/20/21 11/20/22  Renee Rival, FNP  clopidogrel (PLAVIX) 75 MG tablet Take 1 tablet (75 mg total) by mouth daily. 06/15/21   Mitzi Hansen, MD  diclofenac Sodium (VOLTAREN) 1 % GEL Apply 2 g topically 4 (four) times daily. 01/09/22   Sherrye Payor A, PA-C  gabapentin (NEURONTIN) 600 MG tablet Take 0.5 tablets (300 mg total) by mouth daily. 10/27/21   Kommor, Madison, MD  lidocaine (LIDODERM) 5 % Place 1 patch onto the skin daily. Remove & Discard patch within 12 hours or as directed by MD 02/22/22   Smoot, Leary Roca, PA-C  losartan (COZAAR) 100 MG tablet Take 1 tablet (100 mg total) by mouth daily for 14 days. 09/08/21 09/22/21  Tretha Sciara, MD  losartan (COZAAR) 50 MG tablet Take 1 tablet (50 mg total) by mouth daily. 06/15/21 06/15/22  Mitzi Hansen, MD  metFORMIN (GLUCOPHAGE) 500 MG tablet Take 1 tablet (500 mg total) by mouth 2 (two) times daily with a meal. 09/05/21 09/05/22  Wynona Dove A, DO  pantoprazole (PROTONIX) 20 MG tablet Take 1 tablet (20 mg total) by mouth daily. 10/10/21   Isla Pence, MD  rosuvastatin  (CRESTOR) 10 MG tablet Take 1 tablet (10 mg total) by mouth at bedtime. 06/15/21 06/15/22  Mitzi Hansen, MD      Allergies    Codeine, Ibuprofen, and Imdur [isosorbide nitrate]    Review of Systems   Review of Systems  Physical Exam Updated Vital Signs BP (!) 188/74   Pulse 77   Temp 99 F (37.2 C) (Oral)   Resp 16   SpO2 99%  Physical Exam  ED Results / Procedures / Treatments   Labs (all labs ordered are listed, but only abnormal results are displayed) Labs Reviewed - No data to display  EKG None  Radiology No results found.  Procedures Procedures  {Document cardiac monitor, telemetry assessment procedure when appropriate:1}  Medications Ordered in ED Medications  ketorolac (TORADOL) 30 MG/ML injection 30 mg (has no administration in time range)    ED Course/ Medical Decision Making/ A&P   {   Click here for ABCD2, HEART and other calculatorsREFRESH Note before signing :1}                          Medical Decision Making Risk Prescription drug management.   ***  {Document critical care time when appropriate:1} {Document review of labs and clinical decision tools ie heart score, Chads2Vasc2 etc:1}  {Document your independent review of radiology images, and any outside records:1} {  Document your discussion with family members, caretakers, and with consultants:1} {Document social determinants of health affecting pt's care:1} {Document your decision making why or why not admission, treatments were needed:1} Final Clinical Impression(s) / ED Diagnoses Final diagnoses:  None    Rx / DC Orders ED Discharge Orders     None

## 2022-03-24 ENCOUNTER — Emergency Department (HOSPITAL_COMMUNITY)
Admission: EM | Admit: 2022-03-24 | Discharge: 2022-03-25 | Disposition: A | Discharge status: Home / Self Care | Payer: 59 | Attending: Emergency Medicine | Admitting: Emergency Medicine

## 2022-03-24 ENCOUNTER — Other Ambulatory Visit: Payer: Self-pay

## 2022-03-24 ENCOUNTER — Emergency Department (HOSPITAL_COMMUNITY): Payer: 59

## 2022-03-24 ENCOUNTER — Encounter (HOSPITAL_COMMUNITY): Payer: Self-pay

## 2022-03-24 DIAGNOSIS — Z79899 Other long term (current) drug therapy: Secondary | ICD-10-CM | POA: Insufficient documentation

## 2022-03-24 DIAGNOSIS — R2233 Localized swelling, mass and lump, upper limb, bilateral: Secondary | ICD-10-CM | POA: Insufficient documentation

## 2022-03-24 DIAGNOSIS — I10 Essential (primary) hypertension: Secondary | ICD-10-CM | POA: Insufficient documentation

## 2022-03-24 DIAGNOSIS — Z7984 Long term (current) use of oral hypoglycemic drugs: Secondary | ICD-10-CM | POA: Insufficient documentation

## 2022-03-24 DIAGNOSIS — Z7902 Long term (current) use of antithrombotics/antiplatelets: Secondary | ICD-10-CM | POA: Insufficient documentation

## 2022-03-24 DIAGNOSIS — E119 Type 2 diabetes mellitus without complications: Secondary | ICD-10-CM | POA: Insufficient documentation

## 2022-03-24 DIAGNOSIS — M79641 Pain in right hand: Secondary | ICD-10-CM | POA: Diagnosis present

## 2022-03-24 DIAGNOSIS — M79642 Pain in left hand: Secondary | ICD-10-CM | POA: Diagnosis not present

## 2022-03-24 MED ORDER — ACETAMINOPHEN 500 MG PO TABS
1000.0000 mg | ORAL_TABLET | Freq: Once | ORAL | Status: AC
  Administered 2022-03-24: 1000 mg via ORAL
  Filled 2022-03-24: qty 2
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
## 2022-03-24 NOTE — Discharge Instructions (Signed)
You are seen the emergency department for bilateral hand pain.  X-ray imaging was performed which showed no acute changes in your hands.  Please try to follow-up with the orthopedist that you have been referred to previously for outpatient management.
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
## 2022-03-27 ENCOUNTER — Other Ambulatory Visit: Payer: Self-pay

## 2022-03-27 ENCOUNTER — Encounter (HOSPITAL_COMMUNITY): Payer: Self-pay

## 2022-03-27 ENCOUNTER — Emergency Department (HOSPITAL_COMMUNITY)
Admission: EM | Admit: 2022-03-27 | Discharge: 2022-03-27 | Disposition: A | Discharge status: Home / Self Care | Payer: 59 | Attending: Emergency Medicine | Admitting: Emergency Medicine

## 2022-03-27 DIAGNOSIS — E119 Type 2 diabetes mellitus without complications: Secondary | ICD-10-CM | POA: Diagnosis not present

## 2022-03-27 DIAGNOSIS — Z79899 Other long term (current) drug therapy: Secondary | ICD-10-CM | POA: Insufficient documentation

## 2022-03-27 DIAGNOSIS — M79641 Pain in right hand: Secondary | ICD-10-CM | POA: Diagnosis not present

## 2022-03-27 DIAGNOSIS — Z7984 Long term (current) use of oral hypoglycemic drugs: Secondary | ICD-10-CM | POA: Diagnosis not present

## 2022-03-27 DIAGNOSIS — M7989 Other specified soft tissue disorders: Secondary | ICD-10-CM | POA: Insufficient documentation

## 2022-03-27 DIAGNOSIS — I10 Essential (primary) hypertension: Secondary | ICD-10-CM | POA: Insufficient documentation

## 2022-03-27 MED ORDER — HYDROCODONE-ACETAMINOPHEN 5-325 MG PO TABS: 1.0000 | ORAL_TABLET | Freq: Four times a day (QID) | ORAL | 0 refills | Status: DC | PRN

## 2022-03-27 MED ORDER — OXYCODONE-ACETAMINOPHEN 5-325 MG PO TABS
1.0000 | ORAL_TABLET | Freq: Once | ORAL | Status: AC
  Administered 2022-03-27: 1 via ORAL
  Filled 2022-03-27: qty 1

## 2022-03-27 NOTE — Discharge Instructions (Signed)
Follow up with a family md

## 2022-03-27 NOTE — ED Triage Notes (Addendum)
Pt c/o chronic bilateral hand swelling and pain.  Pain score 10/10.  Pt has not taken anything for pain.  Also, Pt reports she forgot to wear her hand brace.    Pt reports pain and swelling "since before the turn of the new year."  Pt reports compression helps and requested a pair of blue gloves, which were provided.  Also, Pt reports she is unable to follow-up with other doctors, because she "doesn't see good" and "can't get around like that."

## 2022-03-27 NOTE — ED Provider Triage Note (Signed)
Emergency Medicine Provider Triage Evaluation Note  [name] , a 77 y.o. female  was evaluated in triage.  Pt complains of bilateral hand pain and hand swelling.  Patient has been seen in the emergency department extensive number of times in the last several months for the same complaints.  Patient reports that she does not follow-up with any primary care or specialist as the ER is closer to her and she preferred to be seen here.  She reports that she is not taking anything for the pain at this time.  Has previously tried taking Tylenol and Tylenol does not help.  Review of Systems  Positive: As above Negative: As above  Physical Exam  BP (!) 211/80   Pulse 70   Temp 98.1 F (36.7 C) (Oral)   Resp 16   Ht '5\' 4"'$  (1.626 m)   Wt 63 kg   SpO2 98%   BMI 23.86 kg/m  Gen:   Awake, no distress   Resp:  Normal effort  MSK:   Reports pain in bilateral hands with range of motion testing Other:    Medical Decision Making  Medically screening exam initiated at 7:02 PM.  Appropriate orders placed.  Denize Hanke was informed that the remainder of the evaluation will be completed by another provider, this initial triage assessment does not replace that evaluation, and the importance of remaining in the ED until their evaluation is complete.     Luvenia Heller, PA-C 03/27/22 1902

## 2022-03-27 NOTE — ED Provider Notes (Signed)
[facility] Provider Note   CSN: [id number] Arrival date & time: 03/27/22  1755     History {Add pertinent medical, surgical, social history, OB history to HPI:1} Chief Complaint  Patient presents with   Hand Pain   Hand Swelling    [name] is a 77 y.o. female.  Patient with chronic hand pain with swelling.  Patient also has a history of hypertension and diabetes.  She has been seen numerous times in the emergency department and never followed up with anybody about her hand pain   Hand Pain       Home Medications Prior to Admission medications   Medication Sig Start Date End Date Taking? Authorizing Provider  HYDROcodone-acetaminophen (NORCO/VICODIN) 5-325 MG tablet Take 1 tablet by mouth every 6 (six) hours as needed for moderate pain. 03/27/22  Yes Milton Ferguson, MD  acetaminophen (TYLENOL) 325 MG tablet Take 2 tablets (650 mg total) by mouth every 6 (six) hours as needed for moderate pain, mild pain or fever. 02/12/22   Small, Brooke L, PA  albuterol (VENTOLIN HFA) 108 (90 Base) MCG/ACT inhaler Inhale 1-2 puffs into the lungs every 6 (six) hours as needed for wheezing or shortness of breath. 04/22/20   Vevelyn Francois, NP  chlorthalidone (HYGROTON) 25 MG tablet TAKE 1 TABLET (25 MG TOTAL) BY MOUTH DAILY. 11/20/21 11/20/22  Renee Rival, FNP  clopidogrel (PLAVIX) 75 MG tablet Take 1 tablet (75 mg total) by mouth daily. 06/15/21   Mitzi Hansen, MD  diclofenac Sodium (VOLTAREN) 1 % GEL Apply 2 g topically 4 (four) times daily. 01/09/22   Sherrye Payor A, PA-C  gabapentin (NEURONTIN) 300 MG capsule Take 1 capsule (300 mg total) by mouth 3 (three) times daily. 03/19/22 04/18/22  Gareth Morgan, MD  lidocaine (LIDODERM) 5 % Place 1 patch onto the skin daily. Remove & Discard patch within 12 hours or as directed by MD 02/22/22   Smoot, Leary Roca, PA-C  losartan (COZAAR) 100 MG tablet Take 1 tablet (100 mg total) by mouth daily for  14 days. 09/08/21 09/22/21  Tretha Sciara, MD  losartan (COZAAR) 50 MG tablet Take 1 tablet (50 mg total) by mouth daily. 06/15/21 06/15/22  Mitzi Hansen, MD  metFORMIN (GLUCOPHAGE) 500 MG tablet Take 1 tablet (500 mg total) by mouth 2 (two) times daily with a meal. 09/05/21 09/05/22  Wynona Dove A, DO  pantoprazole (PROTONIX) 20 MG tablet Take 1 tablet (20 mg total) by mouth daily. 10/10/21   Isla Pence, MD  rosuvastatin (CRESTOR) 10 MG tablet Take 1 tablet (10 mg total) by mouth at bedtime. 06/15/21 06/15/22  Mitzi Hansen, MD      Allergies    Codeine, Ibuprofen, and Imdur [isosorbide nitrate]    Review of Systems   Review of Systems  Physical Exam Updated Vital Signs BP (!) 209/75 (BP Location: Right Arm)   Pulse 64   Temp 98 F (36.7 C) (Oral)   Resp 16   Ht '5\' 4"'$  (1.626 m)   Wt 63 kg   SpO2 98%   BMI 23.86 kg/m  Physical Exam  ED Results / Procedures / Treatments   Labs (all labs ordered are listed, but only abnormal results are displayed) Labs Reviewed - No data to display  EKG None  Radiology No results found.  Procedures Procedures  {Document cardiac monitor, telemetry assessment procedure when appropriate:1}  Medications Ordered in ED Medications  oxyCODONE-acetaminophen (PERCOCET/ROXICET) 5-325 MG per tablet 1 tablet (has  no administration in time range)    ED Course/ Medical Decision Making/ A&P   {   Click here for ABCD2, HEART and other calculatorsREFRESH Note before signing :1}                          Medical Decision Making Risk Prescription drug management.   ***  {Document critical care time when appropriate:1} {Document review of labs and clinical decision tools ie heart score, Chads2Vasc2 etc:1}  {Document your independent review of radiology images, and any outside records:1} {Document your discussion with family members, caretakers, and with consultants:1} {Document social determinants of health affecting pt's  care:1} {Document your decision making why or why not admission, treatments were needed:1} Final Clinical Impression(s) / ED Diagnoses Final diagnoses:  Right hand pain    Rx / DC Orders ED Discharge Orders          Ordered    HYDROcodone-acetaminophen (NORCO/VICODIN) 5-325 MG tablet  Every 6 hours PRN        03/27/22 2213

## 2022-04-01 ENCOUNTER — Other Ambulatory Visit: Payer: Self-pay

## 2022-04-01 ENCOUNTER — Encounter (HOSPITAL_COMMUNITY): Payer: Self-pay | Admitting: *Deleted

## 2022-04-01 ENCOUNTER — Emergency Department (HOSPITAL_COMMUNITY)
Admission: EM | Admit: 2022-04-01 | Discharge: 2022-04-01 | Disposition: A | Discharge status: Home / Self Care | Payer: 59 | Attending: Emergency Medicine | Admitting: Emergency Medicine

## 2022-04-01 DIAGNOSIS — M79643 Pain in unspecified hand: Secondary | ICD-10-CM | POA: Diagnosis present

## 2022-04-01 MED ORDER — LOSARTAN POTASSIUM 50 MG PO TABS
50.0000 mg | ORAL_TABLET | Freq: Once | ORAL | Status: AC
  Administered 2022-04-01: 50 mg via ORAL
  Filled 2022-04-01: qty 1

## 2022-04-01 MED ORDER — ACETAMINOPHEN 325 MG PO TABS
650.0000 mg | ORAL_TABLET | Freq: Once | ORAL | Status: AC
  Administered 2022-04-01: 650 mg via ORAL
  Filled 2022-04-01: qty 2
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
## 2022-04-01 NOTE — ED Provider Notes (Signed)
[facility] Provider Note   CSN: [id number] Arrival date & time: 04/01/22  1919     History No chief complaint on file.   HPI [name] is a 77 y.o. female presenting for recurrent hand pain, history of similar.   Patient's recorded medical, surgical, social, medication list and allergies were reviewed in the Snapshot window as part of the initial history.   Review of Systems   Review of Systems  Constitutional:  Negative for chills and fever.  HENT:  Negative for ear pain and sore throat.   Eyes:  Negative for pain and visual disturbance.  Respiratory:  Negative for cough and shortness of breath.   Cardiovascular:  Negative for chest pain and palpitations.  Gastrointestinal:  Negative for abdominal pain and vomiting.  Genitourinary:  Negative for dysuria and hematuria.  Musculoskeletal:  Negative for arthralgias and back pain.  Skin:  Negative for color change and rash.  Neurological:  Negative for seizures and syncope.  All other systems reviewed and are negative.   Physical Exam Updated Vital Signs BP (!) 225/83 (BP Location: Right Arm)   Pulse 70   Temp 97.9 F (36.6 C) (Oral)   Resp 18   Ht '5\' 4"'$  (1.626 m)   Wt 63 kg   SpO2 97%   BMI 23.84 kg/m  Physical Exam Vitals and nursing note reviewed.  Constitutional:      General: She is not in acute distress.    Appearance: She is well-developed.  HENT:     Head: Normocephalic and atraumatic.  Eyes:     Conjunctiva/sclera: Conjunctivae normal.  Cardiovascular:     Rate and Rhythm: Normal rate and regular rhythm.     Heart sounds: No murmur heard. Pulmonary:     Effort: Pulmonary effort is normal. No respiratory distress.     Breath sounds: Normal breath sounds.  Abdominal:     Palpations: Abdomen is soft.     Tenderness: There is no abdominal tenderness.  Musculoskeletal:        General: No swelling.     Cervical back: Neck supple.  Skin:    General: Skin  is warm and dry.     Capillary Refill: Capillary refill takes less than 2 seconds.  Neurological:     Mental Status: She is alert.  Psychiatric:        Mood and Affect: Mood normal.      ED Course/ Medical Decision Making/ A&P    Procedures Procedures   Medications Ordered in ED Medications  losartan (COZAAR) tablet 50 mg (has no administration in time range)  acetaminophen (TYLENOL) tablet 650 mg (has no administration in time range)    Medical Decision Making:    [name] is a 77 y.o. female who presented to the ED today with hand pain detailed above.    Patient's history present on his physical exam findings do not reveal any focal pathology.  She is grossly hypertensive.  Informed patient of this finding stated that she has not taken her evening high antihypertensives.  Given extensive evaluations, no acute change from prior presentations, no acute indication for further intervention at this time. Disposition:  I have considered need for hospitalization, however, considering all of the above, I believe this patient is stable for discharge at this time.  Patient/family educated about specific return precautions for given chief complaint and symptoms.  Patient/family educated about follow-up with PCP.     Patient/family expressed understanding of return  precautions and need for follow-up. Patient spoken to regarding all imaging and laboratory results and appropriate follow up for these results. All education provided in verbal form with additional information in written form. Time was allowed for answering of patient questions. Patient discharged.    Emergency Department Medication Summary:   Medications  losartan (COZAAR) tablet 50 mg (has no administration in time range)  acetaminophen (TYLENOL) tablet 650 mg (has no administration in time range)        Clinical Impression: No diagnosis found.   Discharge   Final Clinical Impression(s) / ED Diagnoses Final  diagnoses:  None    Rx / DC Orders ED Discharge Orders     None         Tretha Sciara, MD 04/01/22 2018

## 2022-04-06 ENCOUNTER — Emergency Department (HOSPITAL_COMMUNITY)
Admission: EM | Admit: 2022-04-06 | Discharge: 2022-04-07 | Disposition: A | Discharge status: Home / Self Care | Payer: 59 | Attending: Emergency Medicine | Admitting: Emergency Medicine

## 2022-04-06 ENCOUNTER — Encounter (HOSPITAL_COMMUNITY): Payer: Self-pay

## 2022-04-06 ENCOUNTER — Other Ambulatory Visit: Payer: Self-pay

## 2022-04-06 DIAGNOSIS — M79642 Pain in left hand: Secondary | ICD-10-CM | POA: Insufficient documentation

## 2022-04-06 DIAGNOSIS — M79641 Pain in right hand: Secondary | ICD-10-CM | POA: Insufficient documentation

## 2022-04-06 DIAGNOSIS — I10 Essential (primary) hypertension: Secondary | ICD-10-CM | POA: Insufficient documentation

## 2022-04-06 DIAGNOSIS — Z79899 Other long term (current) drug therapy: Secondary | ICD-10-CM | POA: Diagnosis not present

## 2022-04-06 DIAGNOSIS — R030 Elevated blood-pressure reading, without diagnosis of hypertension: Secondary | ICD-10-CM

## 2022-04-06 MED ORDER — LOSARTAN POTASSIUM 50 MG PO TABS
50.0000 mg | ORAL_TABLET | Freq: Once | ORAL | Status: AC
  Administered 2022-04-06: 50 mg via ORAL
  Filled 2022-04-06: qty 1

## 2022-04-06 NOTE — Discharge Instructions (Signed)
Please follow-up with the clinic listed.  Take medications as prescribed.

## 2022-04-06 NOTE — ED Provider Triage Note (Signed)
Emergency Medicine Provider Triage Evaluation Note  [name] , a 77 y.o. female  was evaluated in triage.  Pt complains of bilateral hand pain.  Is seen in the ED frequently for this.  Has not taken anything for pain today.  States Tylenol does not help.  Reports some tingling.  No new traumatic injuries.  States she forgot to put her brace on her wrist today.  Review of Systems  Positive: As above Negative: As above  Physical Exam  BP (!) 239/106 (BP Location: Right Arm)   Pulse 78   Temp 98.6 F (37 C) (Oral)   Resp 20   SpO2 98%  Gen:   Awake, no distress   Resp:  Normal effort  MSK:   Moves extremities without difficulty  Other:  C-collar in place.  Bilateral hands swollen.  No erythema.  Capillary refill less than 2 seconds.  Sensation intact in all fingers.  She is able to squeeze and has full grip strength bilaterally.  Radial pulses 2+ bilaterally  Medical Decision Making  Medically screening exam initiated at 9:02 PM.  Appropriate orders placed.  [name] was informed that the remainder of the evaluation will be completed by another provider, this initial triage assessment does not replace that evaluation, and the importance of remaining in the ED until their evaluation is complete.     [name], [place] 04/06/22 2103
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
## 2022-04-06 NOTE — ED Triage Notes (Signed)
Chronic bilateral hand pain. No changes since last visits.

## 2022-04-20 ENCOUNTER — Emergency Department (HOSPITAL_COMMUNITY): Payer: 59

## 2022-04-20 ENCOUNTER — Emergency Department (HOSPITAL_COMMUNITY)
Admission: EM | Admit: 2022-04-20 | Discharge: 2022-04-21 | Disposition: A | Discharge status: Home / Self Care | Payer: 59 | Attending: Emergency Medicine | Admitting: Emergency Medicine

## 2022-04-20 ENCOUNTER — Encounter (HOSPITAL_COMMUNITY): Payer: Self-pay

## 2022-04-20 ENCOUNTER — Other Ambulatory Visit: Payer: Self-pay

## 2022-04-20 DIAGNOSIS — M79642 Pain in left hand: Secondary | ICD-10-CM | POA: Insufficient documentation

## 2022-04-20 DIAGNOSIS — M79641 Pain in right hand: Secondary | ICD-10-CM | POA: Insufficient documentation

## 2022-04-20 DIAGNOSIS — E119 Type 2 diabetes mellitus without complications: Secondary | ICD-10-CM | POA: Insufficient documentation

## 2022-04-20 DIAGNOSIS — G8929 Other chronic pain: Secondary | ICD-10-CM

## 2022-04-20 DIAGNOSIS — R519 Headache, unspecified: Secondary | ICD-10-CM | POA: Diagnosis not present

## 2022-04-20 DIAGNOSIS — Z7984 Long term (current) use of oral hypoglycemic drugs: Secondary | ICD-10-CM | POA: Insufficient documentation

## 2022-04-20 DIAGNOSIS — Z7902 Long term (current) use of antithrombotics/antiplatelets: Secondary | ICD-10-CM | POA: Insufficient documentation

## 2022-04-20 DIAGNOSIS — I10 Essential (primary) hypertension: Secondary | ICD-10-CM | POA: Diagnosis not present

## 2022-04-20 DIAGNOSIS — R030 Elevated blood-pressure reading, without diagnosis of hypertension: Secondary | ICD-10-CM

## 2022-04-20 DIAGNOSIS — Z79899 Other long term (current) drug therapy: Secondary | ICD-10-CM | POA: Diagnosis not present

## 2022-04-20 MED ORDER — LOSARTAN POTASSIUM 50 MG PO TABS: 50.0000 mg | ORAL_TABLET | Freq: Every day | ORAL | 3 refills | Status: DC

## 2022-04-20 MED ORDER — NAPROXEN 250 MG PO TABS
500.0000 mg | ORAL_TABLET | Freq: Once | ORAL | Status: AC
  Administered 2022-04-20: 500 mg via ORAL
  Filled 2022-04-20: qty 2

## 2022-04-20 MED ORDER — LOSARTAN POTASSIUM 50 MG PO TABS
50.0000 mg | ORAL_TABLET | Freq: Once | ORAL | Status: AC
  Administered 2022-04-20: 50 mg via ORAL
  Filled 2022-04-20: qty 1

## 2022-04-20 NOTE — Discharge Instructions (Addendum)
You are seen emergency department today for ongoing hand pain and headache.  We gave you home dose of your blood pressure medication, and have sent refills to your pharmacy.  I sent 3 months worth of medicine.  I have attached the contact information for [facility], see you can call and establish with a primary doctor.  Hopefully if you are able to follow-up with them you can be more closely monitored for your blood pressure, and return to them for chronic pain.
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
## 2022-04-20 NOTE — ED Provider Triage Note (Signed)
Emergency Medicine Provider Triage Evaluation Note  Ahmaria Hinkelman , a 77 y.o. female  was evaluated in triage.  Pt complains of hand pain and swelling.  Review of Systems  Positive: Hand pain Negative: History of gout  Physical Exam  BP (!) 185/68 (BP Location: Right Arm)   Pulse 73   Temp 98.7 F (37.1 C) (Oral)   Resp 16   SpO2 98%  Gen:   Awake, no distress   Resp:  Normal effort  MSK:   Moves extremities without difficulty  Other:  Swelling of the hand noted  Medical Decision Making  Medically screening exam initiated at 6:14 PM.  Appropriate orders placed.  Jessalyn Levine was informed that the remainder of the evaluation will be completed by another provider, this initial triage assessment does not replace that evaluation, and the importance of remaining in the ED until their evaluation is complete.  Tylenol offered but declined   Lura, [place], PA-C 04/20/22 1815
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
## 2022-04-21 NOTE — ED Notes (Signed)
Pt provided with AVS.  Education complete; all questions answered.    Pt removed supplied wrist brace, stating she had plenty of them at home.  Pt taken to ED lobby for discharge via wheelchair per previous instructions by charge RN.  Pt leaving with all belongings.

## 2022-04-23 ENCOUNTER — Emergency Department (HOSPITAL_COMMUNITY)
Admission: EM | Admit: 2022-04-23 | Discharge: 2022-04-24 | Disposition: A | Discharge status: Home / Self Care | Payer: 59 | Attending: Emergency Medicine | Admitting: Emergency Medicine

## 2022-04-23 ENCOUNTER — Other Ambulatory Visit: Payer: Self-pay

## 2022-04-23 DIAGNOSIS — Z7984 Long term (current) use of oral hypoglycemic drugs: Secondary | ICD-10-CM | POA: Diagnosis not present

## 2022-04-23 DIAGNOSIS — I10 Essential (primary) hypertension: Secondary | ICD-10-CM | POA: Insufficient documentation

## 2022-04-23 DIAGNOSIS — M79641 Pain in right hand: Secondary | ICD-10-CM | POA: Insufficient documentation

## 2022-04-23 DIAGNOSIS — Z8673 Personal history of transient ischemic attack (TIA), and cerebral infarction without residual deficits: Secondary | ICD-10-CM | POA: Insufficient documentation

## 2022-04-23 DIAGNOSIS — R2233 Localized swelling, mass and lump, upper limb, bilateral: Secondary | ICD-10-CM | POA: Insufficient documentation

## 2022-04-23 DIAGNOSIS — E119 Type 2 diabetes mellitus without complications: Secondary | ICD-10-CM | POA: Diagnosis not present

## 2022-04-23 DIAGNOSIS — Z7902 Long term (current) use of antithrombotics/antiplatelets: Secondary | ICD-10-CM | POA: Diagnosis not present

## 2022-04-23 DIAGNOSIS — Z79899 Other long term (current) drug therapy: Secondary | ICD-10-CM | POA: Insufficient documentation

## 2022-04-23 NOTE — ED Triage Notes (Signed)
Patient reports chronic left hand pain for several months worse today , denies recent injury.
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
## 2022-04-24 DIAGNOSIS — M79641 Pain in right hand: Secondary | ICD-10-CM | POA: Diagnosis not present

## 2022-04-24 MED ORDER — LOSARTAN POTASSIUM 50 MG PO TABS: 50.0000 mg | ORAL_TABLET | Freq: Once | ORAL | Status: DC

## 2022-04-24 MED ORDER — NAPROXEN 250 MG PO TABS
500.0000 mg | ORAL_TABLET | Freq: Once | ORAL | Status: AC
  Administered 2022-04-24: 500 mg via ORAL
  Filled 2022-04-24: qty 2

## 2022-04-24 NOTE — ED Provider Notes (Signed)
[facility] Provider Note   CSN: [id number] Arrival date & time: 04/23/22  1946     History  Chief Complaint  Patient presents with   Hand Pain    [name] is a 77 y.o. female.  HPI     This is a 77 year old female well-known to our emergency department who presents with right hand pain.  She has presented with very similar complaints many times.  She has had approximately 30 visits in the last 6 months for similar symptoms.  She reports ongoing pain and swelling.  She has had multiple x-ray images and workup that have been largely unremarkable.  She continues to intermittently wrap her right wrist.  She has not had any fevers.   Her initial blood pressure was 197/80.  History of significantly elevated blood pressures with similar pressures in the past.  She had her blood pressure medication renewed in the emergency room 2 days ago.  It is unclear whether she is compliant.  She denies any headache, chest pain, shortness of breath.  Home Medications Prior to Admission medications   Medication Sig Start Date End Date Taking? Authorizing Provider  acetaminophen (TYLENOL) 325 MG tablet Take 2 tablets (650 mg total) by mouth every 6 (six) hours as needed for moderate pain, mild pain or fever. 02/12/22   Small, Brooke L, PA  albuterol (VENTOLIN HFA) 108 (90 Base) MCG/ACT inhaler Inhale 1-2 puffs into the lungs every 6 (six) hours as needed for wheezing or shortness of breath. 04/22/20   Vevelyn Francois, NP  chlorthalidone (HYGROTON) 25 MG tablet TAKE 1 TABLET (25 MG TOTAL) BY MOUTH DAILY. 11/20/21 11/20/22  Renee Rival, FNP  clopidogrel (PLAVIX) 75 MG tablet Take 1 tablet (75 mg total) by mouth daily. 06/15/21   Mitzi Hansen, MD  diclofenac Sodium (VOLTAREN) 1 % GEL Apply 2 g topically 4 (four) times daily. 01/09/22   Sherrye Payor A, PA-C  gabapentin (NEURONTIN) 300 MG capsule Take 1 capsule (300 mg total) by mouth 3 (three) times  daily. 03/19/22 04/18/22  Gareth Morgan, MD  HYDROcodone-acetaminophen (NORCO/VICODIN) 5-325 MG tablet Take 1 tablet by mouth every 6 (six) hours as needed for moderate pain. 03/27/22   Milton Ferguson, MD  lidocaine (LIDODERM) 5 % Place 1 patch onto the skin daily. Remove & Discard patch within 12 hours or as directed by MD 02/22/22   Smoot, Leary Roca, PA-C  losartan (COZAAR) 50 MG tablet Take 1 tablet (50 mg total) by mouth daily. 04/20/22 05/20/22  Roemhildt, Lorin T, PA-C  metFORMIN (GLUCOPHAGE) 500 MG tablet Take 1 tablet (500 mg total) by mouth 2 (two) times daily with a meal. 09/05/21 09/05/22  Wynona Dove A, DO  pantoprazole (PROTONIX) 20 MG tablet Take 1 tablet (20 mg total) by mouth daily. 10/10/21   Isla Pence, MD  rosuvastatin (CRESTOR) 10 MG tablet Take 1 tablet (10 mg total) by mouth at bedtime. 06/15/21 06/15/22  Mitzi Hansen, MD      Allergies    Codeine, Ibuprofen, and Imdur [isosorbide nitrate]    Review of Systems   Review of Systems  Constitutional:  Negative for fever.  Cardiovascular:  Negative for chest pain.  Musculoskeletal:        Hand pain  Neurological:  Negative for headaches.  All other systems reviewed and are negative.   Physical Exam Updated Vital Signs BP (!) 224/96   Pulse 65   Temp 97.8 F (36.6 C) (Oral)   Resp  16   SpO2 99%  Physical Exam Vitals and nursing note reviewed.  Constitutional:      Appearance: She is well-developed. She is not ill-appearing.  HENT:     Head: Normocephalic and atraumatic.  Eyes:     Pupils: Pupils are equal, round, and reactive to light.  Neck:     Comments: C-collar in place Cardiovascular:     Rate and Rhythm: Normal rate and regular rhythm.     Heart sounds: Normal heart sounds.  Pulmonary:     Effort: Pulmonary effort is normal. No respiratory distress.  Abdominal:     Palpations: Abdomen is soft.  Musculoskeletal:     Comments: Slight swelling noted to the bilateral hands, no warmth or erythema,  flexion extension intact, neurovascularly intact, brace on left wrist  Skin:    General: Skin is warm and dry.  Neurological:     Mental Status: She is alert and oriented to person, place, and time.     ED Results / Procedures / Treatments   Labs (all labs ordered are listed, but only abnormal results are displayed) Labs Reviewed - No data to display  EKG None  Radiology No results found.  Procedures Procedures    Medications Ordered in ED Medications  naproxen (NAPROSYN) tablet 500 mg (500 mg Oral Given 04/24/22 0321)    ED Course/ Medical Decision Making/ A&P                             Medical Decision Making Risk Prescription drug management.   This patient presents to the ED for concern of hand pain, this involves an extensive number of treatment options, and is a complaint that carries with it a high risk of complications and morbidity.  I considered the following differential and admission for this acute, potentially life threatening condition.  The differential diagnosis includes chronic pain, arthritis, dependent swelling  MDM:    This is a 77 year old female who presents with hand pain.  This is a chronic condition for her.  It appears unchanged from the last time I saw her.  She is notably hypertensive.  This is very comparable to her prior multiple ED visits.  She was given a prescription for her home blood pressure medication 2 days ago.  She is not very forthcoming and I question whether she may be compliant.  She has no signs or symptoms of hypertensive urgency or emergency.  I have encouraged her to establish primary care and make sure to take her blood pressure medications.  Recommend anti-inflammatories for her hand pain.  (Labs, imaging, consults)  Labs: I Ordered, and personally interpreted labs.  The pertinent results include: N/A  Imaging Studies ordered: I ordered imaging studies including N/A I independently visualized and interpreted  imaging. I agree with the radiologist interpretation  Additional history obtained from chart review.  External records from outside source obtained and reviewed including prior evaluations  Cardiac Monitoring: The patient was not maintained on a cardiac monitor.  If on the cardiac monitor, I personally viewed and interpreted the cardiac monitored which showed an underlying rhythm of: N/A  Reevaluation: After the interventions noted above, I reevaluated the patient and found that they have :stayed the same  Social Determinants of Health:  lives independently  Disposition: Discharge  Co morbidities that complicate the patient evaluation  Past Medical History:  Diagnosis Date   Diabetes mellitus without complication ([facility])    GERD 09/04/2006  Qualifier: Diagnosis of  By: Suzie Portela     History of echocardiogram    a. 2D ECHO: 11/06/2013 EF 65%; no WMA. Mild TR. PA pk pressure 43 mm HG   Hypertension    Stroke ([facility])      Medicines Meds ordered this encounter  Medications   naproxen (NAPROSYN) tablet 500 mg   DISCONTD: losartan (COZAAR) tablet 50 mg    I have reviewed the patients home medicines and have made adjustments as needed  Problem List / ED Course: Problem List Items Addressed This Visit   None Visit Diagnoses     Hand pain, right    -  Primary   Uncontrolled hypertension                       Final Clinical Impression(s) / ED Diagnoses Final diagnoses:  Hand pain, right  Uncontrolled hypertension    Rx / DC Orders ED Discharge Orders     None         Merryl Hacker, MD 04/24/22 [phone]

## 2022-04-24 NOTE — ED Notes (Signed)
Pt in bed with [place] J collar on from home. Pt chin under collar. Assisted pt with proper positioning of collar. Pt upset with this RN because now collar is uncomfortable. Attempted to educate pt on proper placement of collar. Unclear of why pt is in collar.

## 2022-04-24 NOTE — Discharge Instructions (Addendum)
You are seen today for ongoing hand pain.  Continue Tylenol or naproxen at home.  It is very important that you take your Cozaar that you are given a prescription for several days ago.  You have very poorly controlled hypertension.  Poorly controlled hypertension can lead to strokes and other cardiovascular illnesses which can even lead to death.

## 2022-04-24 NOTE — ED Notes (Signed)
Informed pt that this RN needs to recheck BP. Pt in many layers of clothes and refusing to remove layers in order to obtain accurate BP. Attempted to educate pt on proper procedure to check BP. Pt states "they always take it over my sleeve". Again attempted to educate pt on proper procedure. Pt compliant with remove layers to apply BP cuff properly and BP obtained. Pt continues to be upset with this RN.

## 2022-04-30 ENCOUNTER — Emergency Department (HOSPITAL_COMMUNITY)
Admission: EM | Admit: 2022-04-30 | Discharge: 2022-04-30 | Disposition: A | Discharge status: Home / Self Care | Payer: 59 | Attending: Emergency Medicine | Admitting: Emergency Medicine

## 2022-04-30 DIAGNOSIS — M79642 Pain in left hand: Secondary | ICD-10-CM | POA: Diagnosis not present

## 2022-04-30 DIAGNOSIS — Z7902 Long term (current) use of antithrombotics/antiplatelets: Secondary | ICD-10-CM | POA: Diagnosis not present

## 2022-04-30 DIAGNOSIS — M79641 Pain in right hand: Secondary | ICD-10-CM | POA: Insufficient documentation

## 2022-04-30 DIAGNOSIS — R030 Elevated blood-pressure reading, without diagnosis of hypertension: Secondary | ICD-10-CM

## 2022-04-30 MED ORDER — ACETAMINOPHEN 500 MG PO TABS
1000.0000 mg | ORAL_TABLET | Freq: Once | ORAL | Status: AC
  Administered 2022-04-30: 1000 mg via ORAL
  Filled 2022-04-30: qty 2

## 2022-04-30 NOTE — Discharge Instructions (Addendum)
Have blood pressure rechecked by primary doctor later this week. Use ice and Tylenol every 4 hours as needed. Wear splint as needed for support.

## 2022-04-30 NOTE — Progress Notes (Signed)
Orthopedic Tech Progress Note Patient Details:  [name] [birth year] [id number]  Ortho Devices Type of Ortho Device: Wrist splint Ortho Device/Splint Location: RUE Ortho Device/Splint Interventions: Ordered, Application, Adjustment   Post Interventions Patient Tolerated: Well Instructions Provided: Care of device  Donald Pore 04/30/2022, 6:14 PM
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
## 2022-05-02 ENCOUNTER — Emergency Department (HOSPITAL_COMMUNITY)
Admission: EM | Admit: 2022-05-02 | Discharge: 2022-05-03 | Disposition: A | Discharge status: Home / Self Care | Payer: 59 | Attending: Emergency Medicine | Admitting: Emergency Medicine

## 2022-05-02 ENCOUNTER — Encounter (HOSPITAL_COMMUNITY): Payer: Self-pay

## 2022-05-02 DIAGNOSIS — Z7902 Long term (current) use of antithrombotics/antiplatelets: Secondary | ICD-10-CM | POA: Diagnosis not present

## 2022-05-02 DIAGNOSIS — H538 Other visual disturbances: Secondary | ICD-10-CM | POA: Diagnosis not present

## 2022-05-02 DIAGNOSIS — R600 Localized edema: Secondary | ICD-10-CM | POA: Insufficient documentation

## 2022-05-02 DIAGNOSIS — Z79899 Other long term (current) drug therapy: Secondary | ICD-10-CM | POA: Diagnosis not present

## 2022-05-02 DIAGNOSIS — M79642 Pain in left hand: Secondary | ICD-10-CM | POA: Diagnosis not present

## 2022-05-02 DIAGNOSIS — I10 Essential (primary) hypertension: Secondary | ICD-10-CM | POA: Diagnosis not present

## 2022-05-02 DIAGNOSIS — M25531 Pain in right wrist: Secondary | ICD-10-CM | POA: Diagnosis not present

## 2022-05-02 DIAGNOSIS — M79641 Pain in right hand: Secondary | ICD-10-CM | POA: Insufficient documentation

## 2022-05-02 MED ORDER — HYDRALAZINE HCL 25 MG PO TABS
25.0000 mg | ORAL_TABLET | Freq: Once | ORAL | Status: AC
  Administered 2022-05-02: 25 mg via ORAL
  Filled 2022-05-02: qty 1

## 2022-05-02 NOTE — ED Notes (Signed)
Charge RN notified that pt's SBP 195, BP has not improved since arrival, pt reports "it's always high when I'm in pain". Pt also has on a thick wooly jacket, and unable to get her arm out of her sleeve at this time for a more accurate BP reading. Would benefit to get pt in room at next availability. Pt has no complaints at current other then her continued hand pain, denies CP or headache.
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
## 2022-05-02 NOTE — ED Provider Triage Note (Signed)
Emergency Medicine Provider Triage Evaluation Note  [name] , a 77 y.o. female  was evaluated in triage.  Pt complains of bilateral hand pain.  Denies any recent injuries.  She states she is having blurry vision which has been going on since she fell which appears to be 4 months ago.  Patient is a challenging historian..  Review of Systems  Per HPI  Physical Exam  BP (!) 227/81 (BP Location: Right Arm)   Pulse 64   Temp 98.3 F (36.8 C)   Resp 16   SpO2 98%  Gen:   Awake, no distress   Resp:  Normal effort  MSK:   Moves extremities without difficulty  Other:  Radial pulses 2+ bilateral.  Wearing c-collar for unclear reason.  Medical Decision Making  Medically screening exam initiated at 6:31 PM.  Appropriate orders placed.  [name] was informed that the remainder of the evaluation will be completed by another provider, this initial triage assessment does not replace that evaluation, and the importance of remaining in the ED until their evaluation is complete.     Adriana Arista, PA-C 05/02/22 1835

## 2022-05-03 ENCOUNTER — Emergency Department (HOSPITAL_COMMUNITY)
Admission: EM | Admit: 2022-05-03 | Discharge: 2022-05-03 | Disposition: A | Discharge status: Home / Self Care | Payer: 59 | Source: Home / Self Care | Attending: Emergency Medicine | Admitting: Emergency Medicine

## 2022-05-03 ENCOUNTER — Encounter (HOSPITAL_COMMUNITY): Payer: Self-pay

## 2022-05-03 DIAGNOSIS — Z7902 Long term (current) use of antithrombotics/antiplatelets: Secondary | ICD-10-CM | POA: Insufficient documentation

## 2022-05-03 DIAGNOSIS — M79641 Pain in right hand: Secondary | ICD-10-CM | POA: Insufficient documentation

## 2022-05-03 DIAGNOSIS — M25531 Pain in right wrist: Secondary | ICD-10-CM | POA: Insufficient documentation

## 2022-05-03 DIAGNOSIS — H538 Other visual disturbances: Secondary | ICD-10-CM | POA: Insufficient documentation

## 2022-05-03 DIAGNOSIS — R600 Localized edema: Secondary | ICD-10-CM | POA: Insufficient documentation

## 2022-05-03 DIAGNOSIS — M25532 Pain in left wrist: Secondary | ICD-10-CM | POA: Insufficient documentation

## 2022-05-03 LAB — CBG MONITORING, ED: Glucose-Capillary: 145 mg/dL — ABNORMAL HIGH (ref 70–99)

## 2022-05-03 MED ORDER — KETOTIFEN FUMARATE 0.035 % OP SOLN
1.0000 [drp] | Freq: Two times a day (BID) | OPHTHALMIC | Status: DC
  Administered 2022-05-03: 1 [drp] via OPHTHALMIC
  Filled 2022-05-03: qty 5

## 2022-05-03 MED ORDER — CHLORTHALIDONE 25 MG PO TABS
25.0000 mg | ORAL_TABLET | Freq: Once | ORAL | Status: DC
  Filled 2022-05-03 (×2): qty 1

## 2022-05-03 MED ORDER — ACETAMINOPHEN 500 MG PO TABS
1000.0000 mg | ORAL_TABLET | Freq: Once | ORAL | Status: AC
  Administered 2022-05-03: 1000 mg via ORAL
  Filled 2022-05-03: qty 2

## 2022-05-03 MED ORDER — NAPROXEN 250 MG PO TABS
500.0000 mg | ORAL_TABLET | Freq: Once | ORAL | Status: AC
  Administered 2022-05-03: 500 mg via ORAL
  Filled 2022-05-03: qty 2

## 2022-05-03 MED ORDER — LOSARTAN POTASSIUM 50 MG PO TABS
50.0000 mg | ORAL_TABLET | Freq: Once | ORAL | Status: AC
  Administered 2022-05-03: 50 mg via ORAL
  Filled 2022-05-03: qty 1

## 2022-05-03 MED ORDER — TETRACAINE HCL 0.5 % OP SOLN
1.0000 [drp] | Freq: Once | OPHTHALMIC | Status: AC
  Administered 2022-05-03: 1 [drp] via OPHTHALMIC
  Filled 2022-05-03: qty 4
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
## 2022-05-03 NOTE — ED Notes (Signed)
Pt's BP has not improved, has been seen twice within the past 24 hrs, continues to have HTN, pt stated "it's high since my hand is hurting still from earlier". Pt continues to wear her thick wooly jacket, unable to get her arm out of her sleeve at this time for a more accurate BP reading. Offer to help pt get her arm from out her jacket sleeve, however pt declined, pt denies CP or headache.

## 2022-05-03 NOTE — Discharge Instructions (Addendum)
Keep taking all your medications.  You can use the eyedrops you were given here as needed up to 3 times a day.  It is very important that you follow-up with the eye doctor as in the future you may need glasses.

## 2022-05-03 NOTE — Discharge Instructions (Signed)
You are seen today for pain in your hand.  You were given a brace.  Follow-up with orthopedics, come back to the ER for new or worsening symptoms.  Your blood pressure was also very high.  Make sure you take your medications and follow-up with your primary care doctor for recheck.

## 2022-05-03 NOTE — ED Notes (Signed)
IOP 11 OD/Right,  7 OS/Left
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
## 2022-05-03 NOTE — ED Notes (Signed)
Pt was educated to f/u with a PCP and ophthalmologist. Pt states she doesn't have time to f/u or know how to get there. RN informed pt of the address and the importance of following up with a provider that specialize in the things she needs. Pt states she prefer coming to [facility] because it is easier.

## 2022-05-03 NOTE — ED Notes (Signed)
[place] at [facility]
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
## 2022-05-03 NOTE — Discharge Instructions (Signed)
Patient went to the place as needed for support.  Follow-up with your primary as needed for continued complaints.

## 2022-05-03 NOTE — ED Notes (Signed)
Pt states she is u/t see the letters on the visual acuity chart. EDP made aware
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
## 2022-05-05 ENCOUNTER — Other Ambulatory Visit: Payer: Self-pay

## 2022-05-05 ENCOUNTER — Emergency Department (HOSPITAL_COMMUNITY)
Admission: EM | Admit: 2022-05-05 | Discharge: 2022-05-05 | Disposition: A | Discharge status: Home / Self Care | Payer: 59 | Attending: Emergency Medicine | Admitting: Emergency Medicine

## 2022-05-05 DIAGNOSIS — Z7902 Long term (current) use of antithrombotics/antiplatelets: Secondary | ICD-10-CM | POA: Insufficient documentation

## 2022-05-05 DIAGNOSIS — M79641 Pain in right hand: Secondary | ICD-10-CM | POA: Insufficient documentation

## 2022-05-05 DIAGNOSIS — I10 Essential (primary) hypertension: Secondary | ICD-10-CM | POA: Insufficient documentation

## 2022-05-05 DIAGNOSIS — M79642 Pain in left hand: Secondary | ICD-10-CM | POA: Insufficient documentation

## 2022-05-05 DIAGNOSIS — G8929 Other chronic pain: Secondary | ICD-10-CM | POA: Insufficient documentation

## 2022-05-05 DIAGNOSIS — R030 Elevated blood-pressure reading, without diagnosis of hypertension: Secondary | ICD-10-CM

## 2022-05-05 DIAGNOSIS — Z79899 Other long term (current) drug therapy: Secondary | ICD-10-CM | POA: Diagnosis not present

## 2022-05-05 MED ORDER — ACETAMINOPHEN 325 MG PO TABS
650.0000 mg | ORAL_TABLET | Freq: Once | ORAL | Status: AC
  Administered 2022-05-05: 650 mg via ORAL
  Filled 2022-05-05: qty 2

## 2022-05-05 NOTE — ED Notes (Signed)
[name] tech, [place],  offered patient wrist splint. Patient refused wrist splint.

## 2022-05-05 NOTE — ED Triage Notes (Signed)
Pt wishes to be evaluated for continuing bilateral hand pain.

## 2022-05-05 NOTE — Discharge Instructions (Signed)
Follow-up with a hand provider and your primary care doctor for evaluation of your chronic hand pain as well as your elevated blood pressure

## 2022-05-05 NOTE — Progress Notes (Signed)
Orthopedic Tech Progress Note Patient Details:  [name] 1945-12-25 [id number]  Patient ID: [name], female   DOB: 29-Jan-1945, 77 y.o.   MRN: [id number] Patient refused brace saying our was to big and she already had one like ours at home anyway. I informed the dr and they said that was ok and to tell the patient to wear the one she already had on til she sees the ortho dr. Trinna Post 05/05/2022, 8:44 PM
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
## 2022-05-08 ENCOUNTER — Emergency Department (HOSPITAL_COMMUNITY)
Admission: EM | Admit: 2022-05-08 | Discharge: 2022-05-09 | Disposition: A | Discharge status: Home / Self Care | Payer: 59 | Attending: Emergency Medicine | Admitting: Emergency Medicine

## 2022-05-08 DIAGNOSIS — R030 Elevated blood-pressure reading, without diagnosis of hypertension: Secondary | ICD-10-CM

## 2022-05-08 DIAGNOSIS — Z7902 Long term (current) use of antithrombotics/antiplatelets: Secondary | ICD-10-CM | POA: Insufficient documentation

## 2022-05-08 DIAGNOSIS — M79641 Pain in right hand: Secondary | ICD-10-CM | POA: Insufficient documentation

## 2022-05-08 DIAGNOSIS — Z79899 Other long term (current) drug therapy: Secondary | ICD-10-CM | POA: Insufficient documentation

## 2022-05-08 DIAGNOSIS — M79642 Pain in left hand: Secondary | ICD-10-CM | POA: Diagnosis not present

## 2022-05-08 DIAGNOSIS — I10 Essential (primary) hypertension: Secondary | ICD-10-CM | POA: Diagnosis not present

## 2022-05-08 NOTE — ED Triage Notes (Signed)
Pt here for hand pain
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
## 2022-05-09 DIAGNOSIS — M79641 Pain in right hand: Secondary | ICD-10-CM | POA: Diagnosis not present

## 2022-05-09 MED ORDER — LOSARTAN POTASSIUM 50 MG PO TABS
50.0000 mg | ORAL_TABLET | Freq: Once | ORAL | Status: AC
  Administered 2022-05-09: 50 mg via ORAL
  Filled 2022-05-09: qty 1

## 2022-05-09 MED ORDER — POLYVINYL ALCOHOL 1.4 % OP SOLN
1.0000 [drp] | OPHTHALMIC | Status: DC | PRN
  Administered 2022-05-09: 1 [drp] via OPHTHALMIC
  Filled 2022-05-09: qty 15

## 2022-05-09 MED ORDER — NAPROXEN 250 MG PO TABS
500.0000 mg | ORAL_TABLET | Freq: Once | ORAL | Status: AC
  Administered 2022-05-09: 500 mg via ORAL
  Filled 2022-05-09: qty 2

## 2022-05-09 NOTE — Discharge Instructions (Addendum)
Take your home meds for your chronic pain. Please make sure you aren't missing your doses of your blood pressure medication as this is important.  Return if development of any new or worsening symptoms.
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
## 2022-05-09 NOTE — ED Notes (Signed)
Pt cleared for discharge. Repeat BP 239/81 by PA Donetta Potts.

## 2022-05-09 NOTE — ED Notes (Signed)
Discharge instructions provided by edp were reinforced to pt. Pt verbalized understanding with no additional questions at this time. Pt also provided with eye drops, see mar, for home use.

## 2022-05-09 NOTE — ED Notes (Signed)
Pt arrives to room at this time via wheelchair. Pt was able to move self from wheelchair to bed.

## 2022-05-16 ENCOUNTER — Encounter (HOSPITAL_COMMUNITY): Payer: Self-pay | Admitting: Emergency Medicine

## 2022-05-16 ENCOUNTER — Emergency Department (HOSPITAL_COMMUNITY)
Admission: EM | Admit: 2022-05-16 | Discharge: 2022-05-16 | Disposition: A | Discharge status: Home / Self Care | Payer: 59 | Attending: Emergency Medicine | Admitting: Emergency Medicine

## 2022-05-16 DIAGNOSIS — M79642 Pain in left hand: Secondary | ICD-10-CM | POA: Diagnosis not present

## 2022-05-16 DIAGNOSIS — M79641 Pain in right hand: Secondary | ICD-10-CM | POA: Insufficient documentation

## 2022-05-16 DIAGNOSIS — Z7901 Long term (current) use of anticoagulants: Secondary | ICD-10-CM | POA: Insufficient documentation

## 2022-05-16 DIAGNOSIS — I10 Essential (primary) hypertension: Secondary | ICD-10-CM | POA: Insufficient documentation

## 2022-05-16 MED ORDER — ACETAMINOPHEN 325 MG PO TABS
650.0000 mg | ORAL_TABLET | Freq: Once | ORAL | Status: AC
  Administered 2022-05-16: 650 mg via ORAL
  Filled 2022-05-16: qty 2

## 2022-05-16 MED ORDER — LOSARTAN POTASSIUM 50 MG PO TABS
50.0000 mg | ORAL_TABLET | Freq: Once | ORAL | Status: AC
  Administered 2022-05-16: 50 mg via ORAL
  Filled 2022-05-16: qty 1
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
## 2022-05-16 NOTE — ED Provider Triage Note (Signed)
Emergency Medicine Provider Triage Evaluation Note  Mckaylah Bettendorf , a 77 y.o. female  was evaluated in triage.  Pt complains of bilateral hand pain.  Patient is well-known to this ED.  No new complaints.  States has been taking Tylenol without relief.  She is requesting something stronger but something that is without a narcotic component..  Review of Systems  Positive: As above Negative: As above  Physical Exam  BP (!) 194/79 (BP Location: Right Arm)   Pulse 71   Temp 98 F (36.7 C) (Oral)   Resp 18   SpO2 98%  Gen:   Awake, no distress  Resp:  Normal effort  MSK:   Moves extremities without difficulty  Other:    Medical Decision Making  Medically screening exam initiated at 7:32 PM.  Appropriate orders placed.  Orlinda Slomski was informed that the remainder of the evaluation will be completed by another provider, this initial triage assessment does not replace that evaluation, and the importance of remaining in the ED until their evaluation is complete.    [name], PA-C 05/16/22 1933

## 2022-05-16 NOTE — Discharge Instructions (Signed)
Make sure to take your blood pressure medications.  Follow-up with a primary care doctor or orthopedic doctor for further evaluation of your persistent hand pain
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
## 2022-05-17 ENCOUNTER — Emergency Department (HOSPITAL_COMMUNITY)
Admission: EM | Admit: 2022-05-17 | Discharge: 2022-05-17 | Disposition: A | Discharge status: Home / Self Care | Payer: 59 | Attending: Emergency Medicine | Admitting: Emergency Medicine

## 2022-05-17 ENCOUNTER — Encounter (HOSPITAL_COMMUNITY): Payer: Self-pay

## 2022-05-17 DIAGNOSIS — Z7902 Long term (current) use of antithrombotics/antiplatelets: Secondary | ICD-10-CM | POA: Diagnosis not present

## 2022-05-17 DIAGNOSIS — M7989 Other specified soft tissue disorders: Secondary | ICD-10-CM | POA: Diagnosis not present

## 2022-05-17 DIAGNOSIS — M25541 Pain in joints of right hand: Secondary | ICD-10-CM | POA: Diagnosis not present

## 2022-05-17 DIAGNOSIS — M25542 Pain in joints of left hand: Secondary | ICD-10-CM | POA: Insufficient documentation

## 2022-05-17 DIAGNOSIS — Z7984 Long term (current) use of oral hypoglycemic drugs: Secondary | ICD-10-CM | POA: Diagnosis not present

## 2022-05-17 DIAGNOSIS — M2550 Pain in unspecified joint: Secondary | ICD-10-CM

## 2022-05-17 DIAGNOSIS — M79642 Pain in left hand: Secondary | ICD-10-CM | POA: Diagnosis present

## 2022-05-17 MED ORDER — DICLOFENAC SODIUM 1 % EX GEL
2.0000 g | Freq: Once | CUTANEOUS | Status: AC
  Administered 2022-05-17: 2 g via TOPICAL
  Filled 2022-05-17: qty 100

## 2022-05-17 MED ORDER — DICLOFENAC SODIUM 1 % EX GEL: 2.0000 g | Freq: Four times a day (QID) | CUTANEOUS | 0 refills | Status: DC

## 2022-05-17 NOTE — ED Triage Notes (Signed)
Pt came in ambulatory for repeated visit for her bilateral hand pain/numbness and blurred vision d/t dry eyes, pt reports wants eye drops. Pt has been seen for both repeatedly at [facility] and [facility]. No changes today. NAD noted, A&O x4.

## 2022-05-17 NOTE — Discharge Instructions (Signed)
Apply Voltaren gel to painful joints as prescribed.  See your doctor.
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
## 2022-05-17 NOTE — ED Notes (Signed)
Pt wheeled from from ed to waiting room .

## 2022-05-19 ENCOUNTER — Emergency Department (HOSPITAL_COMMUNITY)
Admission: EM | Admit: 2022-05-19 | Discharge: 2022-05-20 | Disposition: A | Discharge status: Home / Self Care | Payer: 59 | Attending: Emergency Medicine | Admitting: Emergency Medicine

## 2022-05-19 DIAGNOSIS — W19XXXA Unspecified fall, initial encounter: Secondary | ICD-10-CM | POA: Diagnosis not present

## 2022-05-19 DIAGNOSIS — Y92811 Bus as the place of occurrence of the external cause: Secondary | ICD-10-CM | POA: Diagnosis not present

## 2022-05-19 DIAGNOSIS — G894 Chronic pain syndrome: Secondary | ICD-10-CM

## 2022-05-19 DIAGNOSIS — M79641 Pain in right hand: Secondary | ICD-10-CM | POA: Insufficient documentation

## 2022-05-19 DIAGNOSIS — M79642 Pain in left hand: Secondary | ICD-10-CM | POA: Diagnosis not present

## 2022-05-19 DIAGNOSIS — Z79899 Other long term (current) drug therapy: Secondary | ICD-10-CM | POA: Insufficient documentation

## 2022-05-19 DIAGNOSIS — Z7902 Long term (current) use of antithrombotics/antiplatelets: Secondary | ICD-10-CM | POA: Insufficient documentation

## 2022-05-19 DIAGNOSIS — M542 Cervicalgia: Secondary | ICD-10-CM | POA: Diagnosis present

## 2022-05-19 DIAGNOSIS — I10 Essential (primary) hypertension: Secondary | ICD-10-CM | POA: Diagnosis not present

## 2022-05-19 LAB — CBG MONITORING, ED: Glucose-Capillary: 94 mg/dL (ref 70–99)

## 2022-05-19 MED ORDER — CLONIDINE HCL 0.2 MG PO TABS
0.2000 mg | ORAL_TABLET | Freq: Once | ORAL | Status: AC
  Administered 2022-05-19: 0.2 mg via ORAL
  Filled 2022-05-19: qty 1

## 2022-05-19 MED ORDER — HYDRALAZINE HCL 25 MG PO TABS
25.0000 mg | ORAL_TABLET | Freq: Once | ORAL | Status: AC
  Administered 2022-05-19: 25 mg via ORAL
  Filled 2022-05-19: qty 1

## 2022-05-19 MED ORDER — CHLORTHALIDONE 25 MG PO TABS
25.0000 mg | ORAL_TABLET | Freq: Every day | ORAL | Status: DC
  Administered 2022-05-19: 25 mg via ORAL
  Filled 2022-05-19: qty 1

## 2022-05-19 NOTE — ED Triage Notes (Signed)
Pt here for bilateral hand pain, neck pain.
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
## 2022-05-20 MED ORDER — LIDOCAINE 5 % EX PTCH: 1.0000 | MEDICATED_PATCH | CUTANEOUS | 2 refills | Status: DC

## 2022-05-22 ENCOUNTER — Encounter (HOSPITAL_COMMUNITY): Payer: Self-pay | Admitting: Emergency Medicine

## 2022-05-22 ENCOUNTER — Emergency Department (HOSPITAL_COMMUNITY)
Admission: EM | Admit: 2022-05-22 | Discharge: 2022-05-22 | Disposition: A | Discharge status: Home / Self Care | Payer: 59 | Attending: Emergency Medicine | Admitting: Emergency Medicine

## 2022-05-22 ENCOUNTER — Other Ambulatory Visit: Payer: Self-pay

## 2022-05-22 DIAGNOSIS — M79642 Pain in left hand: Secondary | ICD-10-CM | POA: Insufficient documentation

## 2022-05-22 DIAGNOSIS — M79641 Pain in right hand: Secondary | ICD-10-CM | POA: Insufficient documentation

## 2022-05-22 MED ORDER — ACETAMINOPHEN 325 MG PO TABS
650.0000 mg | ORAL_TABLET | Freq: Once | ORAL | Status: AC
  Administered 2022-05-22: 650 mg via ORAL
  Filled 2022-05-22: qty 2

## 2022-05-22 MED ORDER — DICLOFENAC SODIUM 1 % EX GEL: 2.0000 g | Freq: Four times a day (QID) | CUTANEOUS | 0 refills | Status: DC

## 2022-05-22 MED ORDER — NAPROXEN 250 MG PO TABS
375.0000 mg | ORAL_TABLET | Freq: Once | ORAL | Status: AC
  Administered 2022-05-22: 375 mg via ORAL
  Filled 2022-05-22: qty 2

## 2022-05-22 MED ORDER — LOSARTAN POTASSIUM 50 MG PO TABS
50.0000 mg | ORAL_TABLET | Freq: Once | ORAL | Status: AC
  Administered 2022-05-22: 50 mg via ORAL
  Filled 2022-05-22: qty 1

## 2022-05-22 NOTE — Discharge Instructions (Signed)
Your exam today was normal.  You received a dose of naproxen, Tylenol, losartan in the emergency department.  Have sent diclofenac gel into the pharmacy for you.  If any concerning symptoms return to the emergency department.
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
## 2022-05-25 ENCOUNTER — Emergency Department (HOSPITAL_COMMUNITY)
Admission: EM | Admit: 2022-05-25 | Discharge: 2022-05-26 | Disposition: A | Discharge status: Home / Self Care | Payer: 59 | Attending: Emergency Medicine | Admitting: Emergency Medicine

## 2022-05-25 ENCOUNTER — Encounter (HOSPITAL_COMMUNITY): Payer: Self-pay

## 2022-05-25 DIAGNOSIS — M79641 Pain in right hand: Secondary | ICD-10-CM | POA: Insufficient documentation

## 2022-05-25 DIAGNOSIS — W1830XA Fall on same level, unspecified, initial encounter: Secondary | ICD-10-CM | POA: Diagnosis not present

## 2022-05-25 DIAGNOSIS — I10 Essential (primary) hypertension: Secondary | ICD-10-CM | POA: Insufficient documentation

## 2022-05-25 DIAGNOSIS — Y92811 Bus as the place of occurrence of the external cause: Secondary | ICD-10-CM | POA: Diagnosis not present

## 2022-05-25 DIAGNOSIS — M79642 Pain in left hand: Secondary | ICD-10-CM | POA: Diagnosis present

## 2022-05-25 DIAGNOSIS — R030 Elevated blood-pressure reading, without diagnosis of hypertension: Secondary | ICD-10-CM

## 2022-05-25 DIAGNOSIS — Z7902 Long term (current) use of antithrombotics/antiplatelets: Secondary | ICD-10-CM | POA: Diagnosis not present

## 2022-05-25 DIAGNOSIS — Z79899 Other long term (current) drug therapy: Secondary | ICD-10-CM | POA: Diagnosis not present
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
## 2022-05-26 DIAGNOSIS — M79642 Pain in left hand: Secondary | ICD-10-CM | POA: Diagnosis not present

## 2022-05-26 MED ORDER — NAPROXEN 250 MG PO TABS
375.0000 mg | ORAL_TABLET | Freq: Once | ORAL | Status: AC
  Administered 2022-05-26: 375 mg via ORAL
  Filled 2022-05-26: qty 2

## 2022-05-26 MED ORDER — NAPROXEN 250 MG PO TABS
375.0000 mg | ORAL_TABLET | Freq: Once | ORAL | Status: DC
  Filled 2022-05-26: qty 2

## 2022-05-26 MED ORDER — LOSARTAN POTASSIUM 50 MG PO TABS
50.0000 mg | ORAL_TABLET | Freq: Once | ORAL | Status: AC
  Administered 2022-05-26: 50 mg via ORAL
  Filled 2022-05-26: qty 1

## 2022-05-26 MED ORDER — LOSARTAN POTASSIUM 50 MG PO TABS
50.0000 mg | ORAL_TABLET | Freq: Once | ORAL | Status: DC
  Filled 2022-05-26: qty 1

## 2022-05-26 NOTE — Discharge Instructions (Signed)
Your exam today was reassuring.  You received a dose of naproxen and losartan in the emergency department.  For any concerning symptoms return to the emergency room.
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
## 2022-05-28 ENCOUNTER — Encounter (HOSPITAL_COMMUNITY): Payer: Self-pay | Admitting: Emergency Medicine

## 2022-05-28 ENCOUNTER — Other Ambulatory Visit: Payer: Self-pay

## 2022-05-28 ENCOUNTER — Emergency Department (HOSPITAL_COMMUNITY)
Admission: EM | Admit: 2022-05-28 | Discharge: 2022-05-28 | Disposition: A | Discharge status: Home / Self Care | Payer: 59 | Attending: Emergency Medicine | Admitting: Emergency Medicine

## 2022-05-28 DIAGNOSIS — M79642 Pain in left hand: Secondary | ICD-10-CM | POA: Diagnosis not present

## 2022-05-28 DIAGNOSIS — M79641 Pain in right hand: Secondary | ICD-10-CM | POA: Diagnosis not present

## 2022-05-28 DIAGNOSIS — Z79899 Other long term (current) drug therapy: Secondary | ICD-10-CM | POA: Insufficient documentation

## 2022-05-28 DIAGNOSIS — I10 Essential (primary) hypertension: Secondary | ICD-10-CM | POA: Insufficient documentation

## 2022-05-28 DIAGNOSIS — Z7902 Long term (current) use of antithrombotics/antiplatelets: Secondary | ICD-10-CM | POA: Diagnosis not present

## 2022-05-28 MED ORDER — ACETAMINOPHEN 500 MG PO TABS: 1000.0000 mg | ORAL_TABLET | Freq: Four times a day (QID) | ORAL | 0 refills | Status: AC | PRN

## 2022-05-28 MED ORDER — ACETAMINOPHEN 500 MG PO TABS
1000.0000 mg | ORAL_TABLET | Freq: Once | ORAL | Status: AC
  Administered 2022-05-28: 1000 mg via ORAL
  Filled 2022-05-28: qty 2
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
## 2022-05-28 NOTE — Discharge Instructions (Signed)
You may take up to 1000 mg of Tylenol 6 times a day.  I changed your prescription and sent it to your pharmacy.  We hope you feel better!

## 2022-05-28 NOTE — ED Triage Notes (Signed)
Pt reports bilateral hand pain that is chronic in nature.

## 2022-05-30 ENCOUNTER — Emergency Department (HOSPITAL_COMMUNITY)
Admission: EM | Admit: 2022-05-30 | Discharge: 2022-05-31 | Disposition: A | Discharge status: Home / Self Care | Payer: 59 | Attending: Emergency Medicine | Admitting: Emergency Medicine

## 2022-05-30 ENCOUNTER — Other Ambulatory Visit: Payer: Self-pay

## 2022-05-30 ENCOUNTER — Encounter (HOSPITAL_COMMUNITY): Payer: Self-pay | Admitting: Pharmacy Technician

## 2022-05-30 DIAGNOSIS — G8929 Other chronic pain: Secondary | ICD-10-CM | POA: Diagnosis not present

## 2022-05-30 DIAGNOSIS — M25511 Pain in right shoulder: Secondary | ICD-10-CM | POA: Insufficient documentation

## 2022-05-30 DIAGNOSIS — Z79899 Other long term (current) drug therapy: Secondary | ICD-10-CM | POA: Insufficient documentation

## 2022-05-30 DIAGNOSIS — Y92811 Bus as the place of occurrence of the external cause: Secondary | ICD-10-CM | POA: Insufficient documentation

## 2022-05-30 DIAGNOSIS — Z7984 Long term (current) use of oral hypoglycemic drugs: Secondary | ICD-10-CM | POA: Insufficient documentation

## 2022-05-30 DIAGNOSIS — M79641 Pain in right hand: Secondary | ICD-10-CM | POA: Diagnosis not present

## 2022-05-30 DIAGNOSIS — M25532 Pain in left wrist: Secondary | ICD-10-CM | POA: Insufficient documentation

## 2022-05-30 DIAGNOSIS — I10 Essential (primary) hypertension: Secondary | ICD-10-CM

## 2022-05-30 DIAGNOSIS — M25531 Pain in right wrist: Secondary | ICD-10-CM | POA: Insufficient documentation

## 2022-05-30 DIAGNOSIS — M542 Cervicalgia: Secondary | ICD-10-CM | POA: Diagnosis not present

## 2022-05-30 DIAGNOSIS — W1830XA Fall on same level, unspecified, initial encounter: Secondary | ICD-10-CM | POA: Diagnosis not present

## 2022-05-30 DIAGNOSIS — E119 Type 2 diabetes mellitus without complications: Secondary | ICD-10-CM | POA: Diagnosis not present

## 2022-05-30 DIAGNOSIS — Z7902 Long term (current) use of antithrombotics/antiplatelets: Secondary | ICD-10-CM | POA: Diagnosis not present

## 2022-05-30 DIAGNOSIS — M25512 Pain in left shoulder: Secondary | ICD-10-CM | POA: Insufficient documentation

## 2022-05-30 DIAGNOSIS — D649 Anemia, unspecified: Secondary | ICD-10-CM

## 2022-05-30 DIAGNOSIS — M79642 Pain in left hand: Secondary | ICD-10-CM | POA: Insufficient documentation
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
## 2022-05-30 NOTE — ED Notes (Signed)
No answer x1

## 2022-05-31 ENCOUNTER — Emergency Department (HOSPITAL_COMMUNITY): Payer: 59

## 2022-05-31 DIAGNOSIS — M79642 Pain in left hand: Secondary | ICD-10-CM | POA: Diagnosis not present

## 2022-05-31 LAB — BASIC METABOLIC PANEL
Anion gap: 10 (ref 5–15)
BUN: 13 mg/dL (ref 8–23)
CO2: 25 mmol/L (ref 22–32)
Calcium: 8.7 mg/dL — ABNORMAL LOW (ref 8.9–10.3)
Chloride: 105 mmol/L (ref 98–111)
Creatinine, Ser: 1.02 mg/dL — ABNORMAL HIGH (ref 0.44–1.00)
GFR, Estimated: 57 mL/min — ABNORMAL LOW (ref >60)
Glucose, Bld: 94 mg/dL (ref 70–99)
Potassium: 3.6 mmol/L (ref 3.5–5.1)
Sodium: 140 mmol/L (ref 135–145)

## 2022-05-31 LAB — CBC WITH DIFFERENTIAL/PLATELET
Abs Immature Granulocytes: 0.01 10*3/uL (ref 0.00–0.07)
Basophils Absolute: 0 10*3/uL (ref 0.0–0.1)
Basophils Relative: 1 %
Eosinophils Absolute: 0.1 10*3/uL (ref 0.0–0.5)
Eosinophils Relative: 2 %
HCT: 33.8 % — ABNORMAL LOW (ref 36.0–46.0)
Hemoglobin: 10.5 g/dL — ABNORMAL LOW (ref 12.0–15.0)
Immature Granulocytes: 0 %
Lymphocytes Relative: 32 %
Lymphs Abs: 2 10*3/uL (ref 0.7–4.0)
MCH: 27.9 pg (ref 26.0–34.0)
MCHC: 31.1 g/dL (ref 30.0–36.0)
MCV: 89.9 fL (ref 80.0–100.0)
Monocytes Absolute: 0.5 10*3/uL (ref 0.1–1.0)
Monocytes Relative: 8 %
Neutro Abs: 3.6 10*3/uL (ref 1.7–7.7)
Neutrophils Relative %: 57 %
Platelets: 225 10*3/uL (ref 150–400)
RBC: 3.76 MIL/uL — ABNORMAL LOW (ref 3.87–5.11)
RDW: 14 % (ref 11.5–15.5)
WBC: 6.2 10*3/uL (ref 4.0–10.5)
nRBC: 0 % (ref 0.0–0.2)

## 2022-05-31 LAB — MAGNESIUM: Magnesium: 1.9 mg/dL (ref 1.7–2.4)

## 2022-05-31 LAB — SEDIMENTATION RATE: Sed Rate: 31 mm/hr — ABNORMAL HIGH (ref 0–22)

## 2022-05-31 MED ORDER — TRAMADOL HCL 50 MG PO TABS
50.0000 mg | ORAL_TABLET | Freq: Once | ORAL | Status: AC
  Administered 2022-05-31: 50 mg via ORAL
  Filled 2022-05-31: qty 1

## 2022-05-31 MED ORDER — AMLODIPINE BESYLATE 10 MG PO TABS: 10.0000 mg | ORAL_TABLET | Freq: Every day | ORAL | 3 refills | Status: DC

## 2022-05-31 MED ORDER — TRAMADOL HCL 50 MG PO TABS: 50.0000 mg | ORAL_TABLET | Freq: Four times a day (QID) | ORAL | 0 refills | Status: DC | PRN

## 2022-05-31 MED ORDER — AMLODIPINE BESYLATE 5 MG PO TABS
10.0000 mg | ORAL_TABLET | Freq: Once | ORAL | Status: AC
  Administered 2022-05-31: 10 mg via ORAL
  Filled 2022-05-31: qty 2

## 2022-05-31 NOTE — ED Notes (Signed)
Patient transported to CT 

## 2022-05-31 NOTE — Discharge Instructions (Addendum)
Try applying ice to the sore areas.  Ice to be applied for up to 30 minutes at a time, up to 4 times a day.  You may take acetaminophen as needed for pain.  I am giving you a prescription for tramadol to use for more severe pain.  Your blood pressure has been very high for several months.  It is very important that your blood pressure be controlled.  Inadequately controlled high blood pressure can lead to heart attacks, strokes, kidney failure.  I am giving you a new blood pressure medication to start taking every day.  It would be helpful if you could check your blood pressure at home.  Blood pressure should be taken once a day, and a record Of the blood pressure readings.  The goal would be to have average blood pressure readings be less than 140 for the top number, less than 90 for the bottom number.  Blood pressure management is something that should be done through a primary care office.  I have included 3 primary care offices that are very close to [facility] and staffed by [facility] staff.  The emergency department is not well-suited to manage high blood pressure.  Please make an appointment with 1 of these clinics to establish care and have them manage your blood pressure and diabetes.

## 2022-06-02 ENCOUNTER — Other Ambulatory Visit: Payer: Self-pay

## 2022-06-02 ENCOUNTER — Emergency Department (HOSPITAL_COMMUNITY)
Admission: EM | Admit: 2022-06-02 | Discharge: 2022-06-02 | Disposition: A | Discharge status: Home / Self Care | Payer: 59 | Attending: Emergency Medicine | Admitting: Emergency Medicine

## 2022-06-02 DIAGNOSIS — R2231 Localized swelling, mass and lump, right upper limb: Secondary | ICD-10-CM | POA: Insufficient documentation

## 2022-06-02 DIAGNOSIS — R52 Pain, unspecified: Secondary | ICD-10-CM | POA: Diagnosis not present

## 2022-06-02 MED ORDER — TRAMADOL HCL 50 MG PO TABS
50.0000 mg | ORAL_TABLET | Freq: Once | ORAL | Status: AC
  Administered 2022-06-02: 50 mg via ORAL
  Filled 2022-06-02: qty 1

## 2022-06-02 NOTE — ED Triage Notes (Signed)
The pt comes here every night with some type of  complaint

## 2022-06-02 NOTE — Discharge Instructions (Addendum)
Today's evaluation has been generally reassuring.  With your ongoing pain and is very importantly follow-up with your primary care physician.  Do not hesitate to return here for concerning changes in your condition.  With your hypertension, and new change in medications from a few days ago it is important that you discuss that with your physician as well.

## 2022-06-02 NOTE — ED Provider Notes (Signed)
[place] EMERGENCY DEPARTMENT AT [facility] Provider Note   CSN: [id number] Arrival date & time: 06/02/22  4098     History  No chief complaint on file.   [name] is a 77 y.o. female.  HPI Patient presents with concern of diffuse pain.  This is her 38 visit over the past 6 months.  Last seen 3 days ago.  She notes that she continues to have pain, poorly controlled with Tylenol, she seemingly denies any new weakness, fever, vision changes, fall.  Pain is seemingly in her upper torso, and neck.    Home Medications Prior to Admission medications   Medication Sig Start Date End Date Taking? Authorizing Provider  acetaminophen (TYLENOL) 500 MG tablet Take 2 tablets (1,000 mg total) by mouth every 6 (six) hours as needed for moderate pain, mild pain or fever. 05/28/22 06/27/22  Redwine, Madison A, PA-C  albuterol (VENTOLIN HFA) 108 (90 Base) MCG/ACT inhaler Inhale 1-2 puffs into the lungs every 6 (six) hours as needed for wheezing or shortness of breath. 04/22/20   Barbette Merino, NP  amLODipine (NORVASC) 10 MG tablet Take 1 tablet (10 mg total) by mouth daily. 05/31/22   Dione Booze, MD  chlorthalidone (HYGROTON) 25 MG tablet TAKE 1 TABLET (25 MG TOTAL) BY MOUTH DAILY. 11/20/21 11/20/22  Donell Beers, FNP  clopidogrel (PLAVIX) 75 MG tablet Take 1 tablet (75 mg total) by mouth daily. 06/15/21   Elige Radon, MD  diclofenac Sodium (VOLTAREN) 1 % GEL Apply 2 g topically 4 (four) times daily. 05/22/22   Karie Mainland, Amjad, PA-C  gabapentin (NEURONTIN) 300 MG capsule Take 1 capsule (300 mg total) by mouth 3 (three) times daily. 03/19/22 04/18/22  Alvira Monday, MD  lidocaine (LIDODERM) 5 % Place 1 patch onto the skin daily. Remove & Discard patch within 12 hours or as directed by MD 05/20/22   Gilda Crease, MD  losartan (COZAAR) 50 MG tablet Take 1 tablet (50 mg total) by mouth daily. 04/20/22 05/20/22  Roemhildt, Lorin T, PA-C  metFORMIN (GLUCOPHAGE) 500 MG tablet Take 1  tablet (500 mg total) by mouth 2 (two) times daily with a meal. 09/05/21 09/05/22  [name] A, DO  pantoprazole (PROTONIX) 20 MG tablet Take 1 tablet (20 mg total) by mouth daily. 10/10/21   [name], MD  rosuvastatin (CRESTOR) 10 MG tablet Take 1 tablet (10 mg total) by mouth at bedtime. 06/15/21 06/15/22  Elige Radon, MD  traMADol (ULTRAM) 50 MG tablet Take 1 tablet (50 mg total) by mouth every 6 (six) hours as needed. 05/31/22   Dione Booze, MD      Allergies    Codeine, Ibuprofen, and Imdur [isosorbide nitrate]    Review of Systems   Review of Systems  All other systems reviewed and are negative.   Physical Exam Updated Vital Signs BP (!) 201/72 (BP Location: Right Arm)   Pulse 62   Temp 98.4 F (36.9 C) (Oral)   Resp 18   Ht 5\' 4"  (1.626 m)   Wt 62.6 kg   SpO2 100%   BMI 23.69 kg/m  Physical Exam Vitals and nursing note reviewed.  Constitutional:      General: She is not in acute distress.    Appearance: She is well-developed. She is not diaphoretic.  HENT:     Head: Normocephalic and atraumatic.  Neck:     Comments: Patient notes that she does not have to wear neck collar, but she is wearing 1 due  to comfort. Cardiovascular:     Pulses: Normal pulses.  Pulmonary:     Effort: Pulmonary effort is normal.  Musculoskeletal:        General: No tenderness or signs of injury.     Comments: Mild swelling of the right hand and fingers limits full extension of the fingers. Brisk capillary refill present. No erythema, no skin changes.   Skin:    General: Skin is warm and dry.     Findings: No erythema or rash.  Neurological:     Mental Status: She is alert and oriented to person, place, and time.     Sensory: No sensory deficit.  Psychiatric:        Behavior: Behavior normal.     Comments: Perseverating on pain     ED Results / Procedures / Treatments   Labs (all labs ordered are listed, but only abnormal results are displayed) Labs Reviewed - No data to  display  EKG None  Radiology No results found.  Procedures Procedures    Medications Ordered in ED Medications  traMADol (ULTRAM) tablet 50 mg (has no administration in time range)    ED Course/ Medical Decision Making/ A&P                             Medical Decision Making Old female with multiple recent ED visits presents after no new fall, without new trauma, without new weakness.  Patient had recent adjustment in her blood pressure medication and this is likely taking some time to take effect.  She is otherwise hemodynamically unremarkable, awake, alert, sitting upright.  Given her prior evaluations, today's physical exam is reassuring, and absent any focal neurodeficits patient received analgesia here was discharged to follow-up with primary care.  Amount and/or Complexity of Data Reviewed External Data Reviewed: notes.  Risk Prescription drug management. Decision regarding hospitalization.  Final Clinical Impression(s) / ED Diagnoses Final diagnoses:  Pain     Gerhard Munch, MD 06/02/22 2155

## 2022-06-03 ENCOUNTER — Emergency Department (HOSPITAL_COMMUNITY)
Admission: EM | Admit: 2022-06-03 | Discharge: 2022-06-04 | Disposition: A | Discharge status: Home / Self Care | Payer: 59 | Attending: Emergency Medicine | Admitting: Emergency Medicine

## 2022-06-03 ENCOUNTER — Other Ambulatory Visit: Payer: Self-pay

## 2022-06-03 DIAGNOSIS — R030 Elevated blood-pressure reading, without diagnosis of hypertension: Secondary | ICD-10-CM | POA: Diagnosis not present

## 2022-06-03 DIAGNOSIS — M79642 Pain in left hand: Secondary | ICD-10-CM | POA: Insufficient documentation

## 2022-06-03 DIAGNOSIS — I10 Essential (primary) hypertension: Secondary | ICD-10-CM | POA: Diagnosis not present

## 2022-06-03 DIAGNOSIS — Z7984 Long term (current) use of oral hypoglycemic drugs: Secondary | ICD-10-CM | POA: Insufficient documentation

## 2022-06-03 DIAGNOSIS — R519 Headache, unspecified: Secondary | ICD-10-CM | POA: Diagnosis not present

## 2022-06-03 DIAGNOSIS — M79641 Pain in right hand: Secondary | ICD-10-CM | POA: Insufficient documentation

## 2022-06-03 DIAGNOSIS — H538 Other visual disturbances: Secondary | ICD-10-CM | POA: Insufficient documentation

## 2022-06-03 DIAGNOSIS — E119 Type 2 diabetes mellitus without complications: Secondary | ICD-10-CM | POA: Insufficient documentation

## 2022-06-03 DIAGNOSIS — G8929 Other chronic pain: Secondary | ICD-10-CM

## 2022-06-03 DIAGNOSIS — Z79899 Other long term (current) drug therapy: Secondary | ICD-10-CM | POA: Insufficient documentation

## 2022-06-03 MED ORDER — PROCHLORPERAZINE MALEATE 5 MG PO TABS
10.0000 mg | ORAL_TABLET | Freq: Once | ORAL | Status: AC
  Administered 2022-06-03: 10 mg via ORAL
  Filled 2022-06-03: qty 2

## 2022-06-03 MED ORDER — LOSARTAN POTASSIUM 50 MG PO TABS
50.0000 mg | ORAL_TABLET | Freq: Every day | ORAL | Status: DC
  Administered 2022-06-04: 50 mg via ORAL
  Filled 2022-06-03: qty 1

## 2022-06-03 MED ORDER — AMLODIPINE BESYLATE 5 MG PO TABS
10.0000 mg | ORAL_TABLET | Freq: Every day | ORAL | Status: DC
  Administered 2022-06-04: 10 mg via ORAL
  Filled 2022-06-03: qty 2

## 2022-06-03 MED ORDER — DIPHENHYDRAMINE HCL 25 MG PO CAPS
25.0000 mg | ORAL_CAPSULE | Freq: Once | ORAL | Status: AC
  Administered 2022-06-03: 25 mg via ORAL
  Filled 2022-06-03: qty 1

## 2022-06-03 NOTE — ED Triage Notes (Signed)
Patient reports mild headache and fingers pain this evening , no injury.

## 2022-06-04 DIAGNOSIS — M79641 Pain in right hand: Secondary | ICD-10-CM | POA: Diagnosis not present

## 2022-06-04 MED ORDER — GABAPENTIN 300 MG PO CAPS
300.0000 mg | ORAL_CAPSULE | Freq: Once | ORAL | Status: AC
  Administered 2022-06-04: 300 mg via ORAL
  Filled 2022-06-04: qty 1

## 2022-06-04 NOTE — ED Notes (Signed)
Provider ordered visual acuity screening. Upon attempting to assess same, patient stated that she could not see anything. Patient was able however to stand and walk to bathroom without issue, and without running into walls. Patient asked how she got to bathroom okay if she could not see, and she replied she could see floors and walls, but not letters. Again, patient had no difficulty walking to and form bathroom independently, and recognized number above door to know what room to return to.
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
## 2022-06-04 NOTE — Discharge Instructions (Addendum)
Elevated blood pressure-your blood pressure is elevated today, is very important that you continue take your blood pressure medication, as uncontrolled high blood pressure can lead to heart problems, strokes, heart attacks, kidney failure, please continue take your blood pressure medication follow-up with your primary doctor. Hand pain-possibly coming from your diabetes, please continue to maintain your blood sugars, continue to take gabapentin, follow-up with your primary doctor. Headaches as well as blurred vision-I have referred you to neurology please call their office for further evaluation.  Come back to the emergency department if you develop chest pain, shortness of breath, severe abdominal pain, uncontrolled nausea, vomiting, diarrhea.

## 2022-06-05 ENCOUNTER — Emergency Department (HOSPITAL_COMMUNITY)
Admission: EM | Admit: 2022-06-05 | Discharge: 2022-06-05 | Disposition: A | Discharge status: Home / Self Care | Payer: 59 | Attending: Emergency Medicine | Admitting: Emergency Medicine

## 2022-06-05 DIAGNOSIS — M79641 Pain in right hand: Secondary | ICD-10-CM | POA: Diagnosis not present

## 2022-06-05 DIAGNOSIS — Z79899 Other long term (current) drug therapy: Secondary | ICD-10-CM | POA: Diagnosis not present

## 2022-06-05 DIAGNOSIS — Z7984 Long term (current) use of oral hypoglycemic drugs: Secondary | ICD-10-CM | POA: Diagnosis not present

## 2022-06-05 DIAGNOSIS — M79642 Pain in left hand: Secondary | ICD-10-CM | POA: Diagnosis present

## 2022-06-05 DIAGNOSIS — Z7902 Long term (current) use of antithrombotics/antiplatelets: Secondary | ICD-10-CM | POA: Diagnosis not present

## 2022-06-05 MED ORDER — TRAMADOL HCL 50 MG PO TABS
50.0000 mg | ORAL_TABLET | Freq: Once | ORAL | Status: AC
  Administered 2022-06-05: 50 mg via ORAL
  Filled 2022-06-05: qty 1

## 2022-06-05 NOTE — Care Management (Signed)
ED RNCM contacted concerning patient frequent ED visits w/o admissions.  This patient is known to TOC. Despite various attempts to arrange transitional care appointments and arranging [facility] services patient comes to the ED. Noted patient to frequent the ED since 2020  during Covid pandemic. Have spoken with patient's family and they state they have been trying to keep her from coming to the ED but patient lives alone and will take public transportation to the ED.  Will attempt to contact [facility] CM team to further follow up with patient in the community.
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
## 2022-06-06 ENCOUNTER — Encounter (HOSPITAL_COMMUNITY): Payer: Self-pay

## 2022-06-06 ENCOUNTER — Emergency Department (HOSPITAL_COMMUNITY)
Admission: EM | Admit: 2022-06-06 | Discharge: 2022-06-06 | Disposition: A | Discharge status: Home / Self Care | Payer: 59 | Attending: Emergency Medicine | Admitting: Emergency Medicine

## 2022-06-06 ENCOUNTER — Other Ambulatory Visit: Payer: Self-pay

## 2022-06-06 DIAGNOSIS — G894 Chronic pain syndrome: Secondary | ICD-10-CM | POA: Diagnosis not present

## 2022-06-06 DIAGNOSIS — M79642 Pain in left hand: Secondary | ICD-10-CM | POA: Insufficient documentation

## 2022-06-06 DIAGNOSIS — Z7984 Long term (current) use of oral hypoglycemic drugs: Secondary | ICD-10-CM | POA: Diagnosis not present

## 2022-06-06 DIAGNOSIS — I10 Essential (primary) hypertension: Secondary | ICD-10-CM | POA: Insufficient documentation

## 2022-06-06 DIAGNOSIS — Z79899 Other long term (current) drug therapy: Secondary | ICD-10-CM | POA: Diagnosis not present

## 2022-06-06 DIAGNOSIS — E119 Type 2 diabetes mellitus without complications: Secondary | ICD-10-CM | POA: Diagnosis not present

## 2022-06-06 DIAGNOSIS — H538 Other visual disturbances: Secondary | ICD-10-CM | POA: Insufficient documentation

## 2022-06-06 DIAGNOSIS — Z7902 Long term (current) use of antithrombotics/antiplatelets: Secondary | ICD-10-CM | POA: Diagnosis not present

## 2022-06-06 DIAGNOSIS — M79641 Pain in right hand: Secondary | ICD-10-CM | POA: Insufficient documentation

## 2022-06-06 MED ORDER — LOSARTAN POTASSIUM 25 MG PO TABS
50.0000 mg | ORAL_TABLET | Freq: Once | ORAL | Status: AC
  Administered 2022-06-06: 50 mg via ORAL
  Filled 2022-06-06: qty 2

## 2022-06-06 MED ORDER — GABAPENTIN 300 MG PO CAPS
300.0000 mg | ORAL_CAPSULE | Freq: Once | ORAL | Status: AC
  Administered 2022-06-06: 300 mg via ORAL
  Filled 2022-06-06: qty 1
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
## 2022-06-06 NOTE — Discharge Instructions (Signed)
Please follow-up with primary care for long-term management of your chronic pain and other comorbidities including your hypertension.  They can better treat the symptoms and regularly monitor you outside of the ED setting.  They may want to add additional blood pressure medications if your blood pressure continues to stay elevated.  I recommend taking this at home and keeping a log to take to this appointment.  I also provided you with follow-up for an eye specialist or you may go to a specialist of your own choosing.  It is important to do so to ensure that you do not need any visual correction or have any other visual abnormalities that need to be followed by a specialist.  For any new injury or worsening condition, please return to the nearest ED for reevaluation.

## 2022-06-06 NOTE — Social Work (Signed)
CSW  spoke with the patient while in TR8. CSW has authorized a taxi voucher for the patient at this time. CSW also explained to the patient that a RNCM will follow up with her tomorrow to help arrange a care management person outside in the community. AT this time there are no further TOC needs.
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
## 2022-06-07 ENCOUNTER — Encounter (HOSPITAL_COMMUNITY): Payer: Self-pay

## 2022-06-07 ENCOUNTER — Emergency Department (HOSPITAL_COMMUNITY)
Admission: EM | Admit: 2022-06-07 | Discharge: 2022-06-07 | Disposition: A | Discharge status: Home / Self Care | Payer: 59 | Attending: Emergency Medicine | Admitting: Emergency Medicine

## 2022-06-07 ENCOUNTER — Telehealth: Payer: Self-pay

## 2022-06-07 DIAGNOSIS — Z79899 Other long term (current) drug therapy: Secondary | ICD-10-CM | POA: Diagnosis not present

## 2022-06-07 DIAGNOSIS — Z8673 Personal history of transient ischemic attack (TIA), and cerebral infarction without residual deficits: Secondary | ICD-10-CM | POA: Insufficient documentation

## 2022-06-07 DIAGNOSIS — G8929 Other chronic pain: Secondary | ICD-10-CM | POA: Insufficient documentation

## 2022-06-07 DIAGNOSIS — E119 Type 2 diabetes mellitus without complications: Secondary | ICD-10-CM | POA: Diagnosis not present

## 2022-06-07 DIAGNOSIS — M79642 Pain in left hand: Secondary | ICD-10-CM | POA: Insufficient documentation

## 2022-06-07 DIAGNOSIS — I10 Essential (primary) hypertension: Secondary | ICD-10-CM | POA: Diagnosis not present

## 2022-06-07 DIAGNOSIS — Z7984 Long term (current) use of oral hypoglycemic drugs: Secondary | ICD-10-CM | POA: Diagnosis not present

## 2022-06-07 DIAGNOSIS — Z7902 Long term (current) use of antithrombotics/antiplatelets: Secondary | ICD-10-CM | POA: Diagnosis not present

## 2022-06-07 DIAGNOSIS — M79641 Pain in right hand: Secondary | ICD-10-CM | POA: Insufficient documentation

## 2022-06-07 MED ORDER — GABAPENTIN 300 MG PO CAPS
300.0000 mg | ORAL_CAPSULE | Freq: Once | ORAL | Status: AC
  Administered 2022-06-07: 300 mg via ORAL
  Filled 2022-06-07: qty 1

## 2022-06-07 MED ORDER — LOSARTAN POTASSIUM 50 MG PO TABS
50.0000 mg | ORAL_TABLET | Freq: Once | ORAL | Status: AC
  Administered 2022-06-07: 50 mg via ORAL
  Filled 2022-06-07: qty 1

## 2022-06-07 NOTE — Discharge Instructions (Signed)
As discussed, workup today overall reassuring.  Recommend follow-up with primary care for repeat assessment of your symptoms.  Continue take medication as prescribed at home.  Please do not hesitate to return to emergency department for worrisome signs and symptoms we discussed become apparent.

## 2022-06-07 NOTE — ED Triage Notes (Signed)
Pt c/o bilateral hand pain that is caused by her hypertension.
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
## 2022-06-07 NOTE — Telephone Encounter (Signed)
This RNCM made follow up call to patient x2, reached a busy signal each time. Will contact [employer] to make referral for CM while in community due to multiple ED visits.

## 2022-06-07 NOTE — Telephone Encounter (Signed)
This RNCM spoke with Simonne Martinet w/[employer] who reports patient does not qualify for Care Management or care coordination. [place] suggested Medicaid can follow patient in community.   ZOX#[id number]   This RNCM spoke with Delilah with [facility]/ Medicaid who reports patient is eligible for care coordination with [employer] (I.e. therapy, housing assistance, day program). Patient will need to call [phone] to initiate services. If patient returns to ED please reach out to [facility] [phone].

## 2022-06-11 ENCOUNTER — Emergency Department (HOSPITAL_COMMUNITY)
Admission: EM | Admit: 2022-06-11 | Discharge: 2022-06-11 | Disposition: A | Discharge status: Home / Self Care | Payer: 59 | Attending: Emergency Medicine | Admitting: Emergency Medicine

## 2022-06-11 ENCOUNTER — Encounter (HOSPITAL_COMMUNITY): Payer: Self-pay

## 2022-06-11 DIAGNOSIS — Z79899 Other long term (current) drug therapy: Secondary | ICD-10-CM | POA: Insufficient documentation

## 2022-06-11 DIAGNOSIS — I10 Essential (primary) hypertension: Secondary | ICD-10-CM | POA: Diagnosis not present

## 2022-06-11 DIAGNOSIS — Z7984 Long term (current) use of oral hypoglycemic drugs: Secondary | ICD-10-CM | POA: Diagnosis not present

## 2022-06-11 DIAGNOSIS — R519 Headache, unspecified: Secondary | ICD-10-CM | POA: Diagnosis present

## 2022-06-11 DIAGNOSIS — E119 Type 2 diabetes mellitus without complications: Secondary | ICD-10-CM | POA: Diagnosis not present

## 2022-06-11 NOTE — ED Triage Notes (Signed)
Pt c/o HTN,  HA; endorses compliance with BP meds

## 2022-06-11 NOTE — ED Provider Notes (Signed)
[place] EMERGENCY DEPARTMENT AT [facility] Provider Note   CSN: [id number] Arrival date & time: 06/11/22  1645     History No chief complaint on file.   [name] is a 77 y.o. female with a past medical history of hypertension and type 2 diabetes presenting today with concern for hypertension.  She says that her blood pressure is too high.  When asked how high she says "I do not know I do not check it."  She says that she believes it is high when she gets a headache.  Also says that she occasionally has visual disturbances but she goes to the eye doctor for this.  Tylenol does not help.  Has not followed up with a PCP.  HPI     Home Medications Prior to Admission medications   Medication Sig Start Date End Date Taking? Authorizing Provider  acetaminophen (TYLENOL) 500 MG tablet Take 2 tablets (1,000 mg total) by mouth every 6 (six) hours as needed for moderate pain, mild pain or fever. 05/28/22 06/27/22  Joahan Swatzell A, PA-C  albuterol (VENTOLIN HFA) 108 (90 Base) MCG/ACT inhaler Inhale 1-2 puffs into the lungs every 6 (six) hours as needed for wheezing or shortness of breath. 04/22/20   Barbette Merino, NP  amLODipine (NORVASC) 10 MG tablet Take 1 tablet (10 mg total) by mouth daily. 05/31/22   Dione Booze, MD  chlorthalidone (HYGROTON) 25 MG tablet TAKE 1 TABLET (25 MG TOTAL) BY MOUTH DAILY. 11/20/21 11/20/22  Donell Beers, FNP  clopidogrel (PLAVIX) 75 MG tablet Take 1 tablet (75 mg total) by mouth daily. 06/15/21   Elige Radon, MD  diclofenac Sodium (VOLTAREN) 1 % GEL Apply 2 g topically 4 (four) times daily. 05/22/22   Karie Mainland, Amjad, PA-C  gabapentin (NEURONTIN) 300 MG capsule Take 1 capsule (300 mg total) by mouth 3 (three) times daily. 03/19/22 04/18/22  Alvira Monday, MD  lidocaine (LIDODERM) 5 % Place 1 patch onto the skin daily. Remove & Discard patch within 12 hours or as directed by MD 05/20/22   Gilda Crease, MD  losartan (COZAAR) 50 MG tablet  Take 1 tablet (50 mg total) by mouth daily. 04/20/22 05/20/22  Roemhildt, Lorin T, PA-C  metFORMIN (GLUCOPHAGE) 500 MG tablet Take 1 tablet (500 mg total) by mouth 2 (two) times daily with a meal. 09/05/21 09/05/22  [name] A, DO  pantoprazole (PROTONIX) 20 MG tablet Take 1 tablet (20 mg total) by mouth daily. 10/10/21   [name], MD  rosuvastatin (CRESTOR) 10 MG tablet Take 1 tablet (10 mg total) by mouth at bedtime. 06/15/21 06/15/22  Elige Radon, MD  traMADol (ULTRAM) 50 MG tablet Take 1 tablet (50 mg total) by mouth every 6 (six) hours as needed. 05/31/22   Dione Booze, MD      Allergies    Codeine, Ibuprofen, and Imdur [isosorbide nitrate]    Review of Systems   Review of Systems  Physical Exam Updated Vital Signs BP (!) 150/63 (BP Location: Left Arm)   Pulse 73   Temp 98.4 F (36.9 C) (Oral)   Resp 16   SpO2 99%  Physical Exam Vitals and nursing note reviewed.  Constitutional:      Appearance: Normal appearance.  HENT:     Head: Normocephalic and atraumatic.  Eyes:     General: No scleral icterus.    Conjunctiva/sclera: Conjunctivae normal.  Pulmonary:     Effort: Pulmonary effort is normal. No respiratory distress.  Skin:  Findings: No rash.  Neurological:     Mental Status: She is alert.     Comments: Ambulatory with her neck brace in triage  Psychiatric:        Mood and Affect: Mood normal.     ED Results / Procedures / Treatments   Labs (all labs ordered are listed, but only abnormal results are displayed) Labs Reviewed - No data to display  EKG None  Radiology No results found.  Procedures Procedures   Medications Ordered in ED Medications - No data to display  ED Course/ Medical Decision Making/ A&P                             Medical Decision Making  77 year old female presenting today with concern for hypertension.  She says that she thought it was high because she was having a headache.  Has not tried any medications for her  headache.  Blood pressure 150s over 60s.  This is not a level that requires treatment in the emergency department.  She says that she takes her blood pressure medications.  She may do Tylenol at home.  I suggested that she check her blood pressure at home and follow-up with her primary care.  She says "my Medicaid covers this visit I am not trying to mess with the primary care."  Of note, today is patient's 10th emergency department visit in May.  Do not believe she has an emergent condition and she will follow-up outpatient.  Final Clinical Impression(s) / ED Diagnoses Final diagnoses:  Hypertension, unspecified type    Rx / DC Orders ED Discharge Orders     None      Results and diagnoses were explained to the patient. Return precautions discussed in full. Patient had no additional questions and expressed complete understanding.   This chart was dictated using voice recognition software.  Despite best efforts to proofread,  errors can occur which can change the documentation meaning.     Woodroe Chen 06/11/22 1737    Terald Sleeper, MD 06/11/22 2209

## 2022-06-11 NOTE — ED Notes (Signed)
Discharged by PA Redwine

## 2022-06-11 NOTE — Discharge Instructions (Signed)
Your blood pressure does not require treatment from the emergency department today.  You may do Tylenol at home.  Avoid ibuprofen due to your blood thinner.  Please check your blood pressure at home so you have a better idea of what time.  It was a pleasure to see you again and I hope you feel better!

## 2022-06-15 ENCOUNTER — Emergency Department (HOSPITAL_COMMUNITY)
Admission: EM | Admit: 2022-06-15 | Discharge: 2022-06-15 | Disposition: A | Discharge status: Home / Self Care | Payer: 59 | Attending: Emergency Medicine | Admitting: Emergency Medicine

## 2022-06-15 ENCOUNTER — Encounter (HOSPITAL_COMMUNITY): Payer: Self-pay

## 2022-06-15 DIAGNOSIS — E119 Type 2 diabetes mellitus without complications: Secondary | ICD-10-CM | POA: Insufficient documentation

## 2022-06-15 DIAGNOSIS — Z79899 Other long term (current) drug therapy: Secondary | ICD-10-CM | POA: Diagnosis not present

## 2022-06-15 DIAGNOSIS — Z7902 Long term (current) use of antithrombotics/antiplatelets: Secondary | ICD-10-CM | POA: Insufficient documentation

## 2022-06-15 DIAGNOSIS — M79642 Pain in left hand: Secondary | ICD-10-CM | POA: Insufficient documentation

## 2022-06-15 DIAGNOSIS — Z7984 Long term (current) use of oral hypoglycemic drugs: Secondary | ICD-10-CM | POA: Insufficient documentation

## 2022-06-15 DIAGNOSIS — I10 Essential (primary) hypertension: Secondary | ICD-10-CM | POA: Insufficient documentation

## 2022-06-15 DIAGNOSIS — G8929 Other chronic pain: Secondary | ICD-10-CM

## 2022-06-15 DIAGNOSIS — M79641 Pain in right hand: Secondary | ICD-10-CM | POA: Insufficient documentation

## 2022-06-15 MED ORDER — ACETAMINOPHEN 325 MG PO TABS
650.0000 mg | ORAL_TABLET | Freq: Once | ORAL | Status: AC
  Administered 2022-06-15: 650 mg via ORAL
  Filled 2022-06-15: qty 2

## 2022-06-15 NOTE — Discharge Instructions (Addendum)
Please call [facility] to set up an appointment so that they can help you manage your hand pain.

## 2022-06-15 NOTE — ED Notes (Signed)
Pt reports bilateral hand pain and neck pain.  Currently in [place] collar.

## 2022-06-15 NOTE — ED Triage Notes (Signed)
Pt arrives today c/o bilateral hand pain since New Years. Pt c/o headache and htn.
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
## 2022-06-17 ENCOUNTER — Other Ambulatory Visit: Payer: Self-pay

## 2022-06-17 ENCOUNTER — Encounter (HOSPITAL_COMMUNITY): Payer: Self-pay

## 2022-06-17 ENCOUNTER — Emergency Department (HOSPITAL_COMMUNITY)
Admission: EM | Admit: 2022-06-17 | Discharge: 2022-06-17 | Disposition: A | Discharge status: Home / Self Care | Payer: 59 | Attending: Emergency Medicine | Admitting: Emergency Medicine

## 2022-06-17 ENCOUNTER — Emergency Department (HOSPITAL_COMMUNITY)
Admission: EM | Admit: 2022-06-17 | Discharge: 2022-06-18 | Disposition: A | Discharge status: Home / Self Care | Payer: 59 | Source: Home / Self Care | Attending: Emergency Medicine | Admitting: Emergency Medicine

## 2022-06-17 DIAGNOSIS — R519 Headache, unspecified: Secondary | ICD-10-CM | POA: Insufficient documentation

## 2022-06-17 DIAGNOSIS — Z8673 Personal history of transient ischemic attack (TIA), and cerebral infarction without residual deficits: Secondary | ICD-10-CM | POA: Diagnosis not present

## 2022-06-17 DIAGNOSIS — M79641 Pain in right hand: Secondary | ICD-10-CM | POA: Insufficient documentation

## 2022-06-17 DIAGNOSIS — E119 Type 2 diabetes mellitus without complications: Secondary | ICD-10-CM | POA: Insufficient documentation

## 2022-06-17 DIAGNOSIS — Z7902 Long term (current) use of antithrombotics/antiplatelets: Secondary | ICD-10-CM | POA: Insufficient documentation

## 2022-06-17 DIAGNOSIS — M79642 Pain in left hand: Secondary | ICD-10-CM | POA: Insufficient documentation

## 2022-06-17 DIAGNOSIS — R030 Elevated blood-pressure reading, without diagnosis of hypertension: Secondary | ICD-10-CM

## 2022-06-17 DIAGNOSIS — I10 Essential (primary) hypertension: Secondary | ICD-10-CM | POA: Insufficient documentation

## 2022-06-17 DIAGNOSIS — Z79899 Other long term (current) drug therapy: Secondary | ICD-10-CM | POA: Insufficient documentation

## 2022-06-17 DIAGNOSIS — Z7984 Long term (current) use of oral hypoglycemic drugs: Secondary | ICD-10-CM | POA: Insufficient documentation

## 2022-06-17 MED ORDER — DIPHENHYDRAMINE HCL 25 MG PO CAPS
25.0000 mg | ORAL_CAPSULE | Freq: Once | ORAL | Status: AC
  Administered 2022-06-17: 25 mg via ORAL
  Filled 2022-06-17: qty 1

## 2022-06-17 MED ORDER — PROCHLORPERAZINE MALEATE 5 MG PO TABS
10.0000 mg | ORAL_TABLET | Freq: Once | ORAL | Status: AC
  Administered 2022-06-17: 10 mg via ORAL
  Filled 2022-06-17: qty 2

## 2022-06-17 NOTE — ED Triage Notes (Signed)
Pt returns today c/o hand pain and hypertension. Pt requesting to speak with social work.

## 2022-06-17 NOTE — TOC Initial Note (Signed)
Transition of Care [facility]) - Initial/Assessment Note    Patient Details  Name: [name] MRN: [id number] Date of Birth: [birth year]  Transition of Care [facility]) CM/SW Contact:    Jorge Amparo C Tarpley-Carter, LCSWA Phone Number: 06/17/2022, 3:15 PM  Clinical Narrative:                 [facility] CSW consulted with pt.  Pt was in need of assistance with her [employer].  CSW provided pt with the number to contact [employer] and provider pt with 211 for information about assistance in her community.    Keeli Roberg Tarpley-Carter, MSW, LCSW-A Pronouns:  She/Her/Hers [facility] Clinical Social Worker Direct Number:  [phone] [email or web address]   Expected Discharge Plan: Home/Self Care Barriers to Discharge: No Barriers Identified   Patient Goals and CMS Choice     Choice offered to / list presented to : NA      Expected Discharge Plan and Services In-house Referral: NA Discharge Planning Services: NA Post Acute Care Choice: NA Living arrangements for the past 2 months: Single Family Home                 DME Arranged: N/A DME Agency: NA       HH Arranged: NA          Prior Living Arrangements/Services Living arrangements for the past 2 months: Single Family Home Lives with:: Self Patient language and need for interpreter reviewed:: No Do you feel safe going back to the place where you live?: Yes      Need for Family Participation in Patient Care: No (Comment) Care giver support system in place?: No (comment)   Criminal Activity/Legal Involvement Pertinent to Current Situation/Hospitalization: No - Comment as needed  Activities of Daily Living      Permission Sought/Granted                  Emotional Assessment       Orientation: : Oriented to Self, Oriented to Place, Oriented to  Time, Oriented to Situation Alcohol / Substance Use: Not Applicable    Admission diagnosis:  bp hp Patient Active Problem List    Diagnosis Date Noted   Cognitive impairment 06/16/2021   Chronic headache 06/16/2021   Healthcare maintenance 06/16/2021   Frequent patient in emergency department 06/16/2021   Cervical stenosis of spine 11/10/2019   Type 2 diabetes mellitus with hyperlipidemia (HCC) 06/20/2012   History of stroke 06/20/2012   Essential hypertension 09/04/2006   Osteoarthritis 09/04/2006   PCP:  Pcp, No Pharmacy:   [id number] [place], [place] - [address] [place] [address] Phone: [phone] Fax: [phone]  [place] [facility] [address] [place] [address] Phone: [phone] Fax: [phone]  [facility] - [place], [place] - [address] [place] [address] Phone: [phone] Fax: [phone]     Social Determinants of Health (SDOH) Social History: SDOH Screenings   Food Insecurity: No Food Insecurity (10/14/2020)  Housing: Low Risk  (10/14/2020)  Transportation Needs: No Transportation Needs (10/14/2020)  Alcohol Screen: Low Risk  (10/14/2020)  Depression (PHQ2-9): Low Risk  (06/15/2021)  Financial Resource Strain: Low Risk  (10/14/2020)  Physical Activity: Insufficiently Active (10/14/2020)  Social Connections: Socially Isolated (10/14/2020)  Stress: No Stress Concern Present (10/14/2020)  Tobacco Use: Low Risk  (06/17/2022)   SDOH Interventions:     Readmission Risk Interventions    11/26/2019  11:46 AM  Readmission Risk Prevention Plan  Transportation Screening Complete  Medication Review Ocean[facility]) Complete  PCP or Specialist appointment within 3-5 days of discharge Complete  HRI or Home Care Consult Complete  SW Recovery Care/Counseling Consult Complete  Palliative Care Screening Not Applicable  Skilled Nursing Facility Not Applicable

## 2022-06-17 NOTE — Discharge Instructions (Addendum)
At this time there does not appear to be the presence of an emergent medical condition, however there is always the potential for conditions to change. Please read and follow the below instructions.  Please return to the Emergency Department immediately for any new or worsening symptoms. Please be sure to follow up with your Primary Care Provider within one week regarding your visit today; please call their office to schedule an appointment even if you are feeling better for a follow-up visit. You may try the diclofenac gel to help with your symptoms. This is over-the-counter if you would like to try it.   Please read the additional information packets attached to your discharge summary.  Go to the nearest Emergency Department immediately if: You have fever or chills You get a very bad headache. You start to feel mixed up (confused). You feel weak or numb. You feel faint. You have very bad pain in your: Chest. Belly (abdomen). You vomit more than once. You have trouble breathing. You have any new/concerning or worsening of symptoms  Do not take your medicine if  develop an itchy rash, swelling in your mouth or lips, or difficulty breathing; call 911 and seek immediate emergency medical attention if this occurs.  You may review your lab tests and imaging results in their entirety on your MyChart account.  Please discuss all results of fully with your primary care provider and other specialist at your follow-up visit.  Note: Portions of this text may have been transcribed using voice recognition software. Every effort was made to ensure accuracy; however, inadvertent computerized transcription errors may still be present.

## 2022-06-17 NOTE — ED Provider Notes (Signed)
[place] EMERGENCY DEPARTMENT AT [facility] Provider Note   CSN: [id number] Arrival date & time: 06/17/22  1117     History  Chief Complaint  Patient presents with   Hypertension    [name] is a 77 y.o. female history includes CVA, hypertension, diabetes, GERD.  Patient presented to the ER today for concern of bilateral hand pain and elevated blood pressure.  She reports that ever since her fall in December 2023 she has been having hand pain she reports it is constant and worsens with motion and palpation and has no alleviating factors she has tried Tylenol without improvement.  She has not tried any topical creams or any additional medications.  She reports that whenever her hands hurt it causes her blood pressure to rise.  She does not check her blood pressure at home but she notices it is up because her hands hurt.  She reports that this does cause an occasional headache and blurry vision for which she has been evaluated in the ER several times for.  She reports that her vision is not changed and she is currently waiting to go see an eye doctor.  She denies any fall or change her symptoms she denies numbness, tingling, headache, chest pain/shortness of breath or any additional concerns.  HPI     Home Medications Prior to Admission medications   Medication Sig Start Date End Date Taking? Authorizing Provider  acetaminophen (TYLENOL) 500 MG tablet Take 2 tablets (1,000 mg total) by mouth every 6 (six) hours as needed for moderate pain, mild pain or fever. 05/28/22 06/27/22  Redwine, Madison A, PA-C  albuterol (VENTOLIN HFA) 108 (90 Base) MCG/ACT inhaler Inhale 1-2 puffs into the lungs every 6 (six) hours as needed for wheezing or shortness of breath. 04/22/20   Barbette Merino, NP  amLODipine (NORVASC) 10 MG tablet Take 1 tablet (10 mg total) by mouth daily. 05/31/22   Dione Booze, MD  chlorthalidone (HYGROTON) 25 MG tablet TAKE 1 TABLET (25 MG TOTAL) BY MOUTH DAILY. 11/20/21  11/20/22  Donell Beers, FNP  clopidogrel (PLAVIX) 75 MG tablet Take 1 tablet (75 mg total) by mouth daily. 06/15/21   Elige Radon, MD  diclofenac Sodium (VOLTAREN) 1 % GEL Apply 2 g topically 4 (four) times daily. 05/22/22   Karie Mainland, Amjad, PA-C  gabapentin (NEURONTIN) 300 MG capsule Take 1 capsule (300 mg total) by mouth 3 (three) times daily. 03/19/22 04/18/22  Alvira Monday, MD  lidocaine (LIDODERM) 5 % Place 1 patch onto the skin daily. Remove & Discard patch within 12 hours or as directed by MD 05/20/22   Gilda Crease, MD  losartan (COZAAR) 50 MG tablet Take 1 tablet (50 mg total) by mouth daily. 04/20/22 05/20/22  Roemhildt, Lorin T, PA-C  metFORMIN (GLUCOPHAGE) 500 MG tablet Take 1 tablet (500 mg total) by mouth 2 (two) times daily with a meal. 09/05/21 09/05/22  [name] A, DO  pantoprazole (PROTONIX) 20 MG tablet Take 1 tablet (20 mg total) by mouth daily. 10/10/21   [name], MD  rosuvastatin (CRESTOR) 10 MG tablet Take 1 tablet (10 mg total) by mouth at bedtime. 06/15/21 06/15/22  Elige Radon, MD  traMADol (ULTRAM) 50 MG tablet Take 1 tablet (50 mg total) by mouth every 6 (six) hours as needed. 05/31/22   Dione Booze, MD      Allergies    Codeine, Ibuprofen, and Imdur [isosorbide nitrate]    Review of Systems   Review of Systems Ten  systems are reviewed and are negative for acute change except as noted in the HPI  Physical Exam Updated Vital Signs BP (!) 168/59 (BP Location: Left Arm)   Pulse 66   Temp 98 F (36.7 C) (Oral)   Resp 16   Ht 5\' 4"  (1.626 m)   Wt 61.2 kg   SpO2 100%   BMI 23.17 kg/m  Physical Exam Constitutional:      General: She is not in acute distress.    Appearance: Normal appearance. She is well-developed. She is not ill-appearing or diaphoretic.  HENT:     Head: Normocephalic and atraumatic.  Eyes:     General: Vision grossly intact. Gaze aligned appropriately.     Pupils: Pupils are equal, round, and reactive to light.   Neck:     Trachea: Trachea and phonation normal.  Cardiovascular:     Rate and Rhythm: Normal rate and regular rhythm.  Pulmonary:     Effort: Pulmonary effort is normal. No respiratory distress.  Abdominal:     General: There is no distension.     Palpations: Abdomen is soft.     Tenderness: There is no abdominal tenderness. There is no guarding or rebound.  Musculoskeletal:        General: Normal range of motion.     Cervical back: Normal range of motion.     Comments: Bilateral hands: Atraumatic in appearance, no overlying skin changes, no significant swelling.  No deformities or skin breaks.  Patient reports diffuse tenderness to the hands and wrist they appear mostly tender over the [facility] joints.  She is able to strong grip both my hands during exam and perform some limited range of motion with the wrist and fingers.  She is opposed to physical examination today.  She is strong radial pulse.  Capillary fill and sensation intact to all fingers.  Compartments are soft.  Negative Hoffmann sign bilaterally.  Skin:    General: Skin is warm and dry.  Neurological:     Mental Status: She is alert.     GCS: GCS eye subscore is 4. GCS verbal subscore is 5. GCS motor subscore is 6.     Comments: Speech is clear and goal oriented, follows commands Major Cranial nerves without deficit, no facial droop Normal strength in upper and lower extremities bilaterally including dorsiflexion and plantar flexion, strong and equal grip strength Sensation normal to light and sharp touch Moves extremities without ataxia, coordination intact Neg romberg, no pronator drift Normal gait  Psychiatric:        Behavior: Behavior normal.     ED Results / Procedures / Treatments   Labs (all labs ordered are listed, but only abnormal results are displayed) Labs Reviewed - No data to display  EKG None  Radiology No results found.  Procedures Procedures    Medications Ordered in ED Medications - No  data to display  ED Course/ Medical Decision Making/ A&P Clinical Course as of 06/17/22 1452  Sun Jun 17, 2022  1255 158/74 [BM]  1255 I evaluated the patient.  Her blood pressure cuff was all over her jacket.  I rechecked her blood pressure, 158/74.  She reports that she would like to talk with his social worker today.  She reports that she is having hand pains ever since her fall in January they are not [BM]  1256  changed.  She reports that whenever her hands hurt it causes her blood pressure to go up [BM]    Clinical  Course User Index [BM] Elizabeth Palau                             Medical Decision Making 77 year old female history as above presented for bilateral hand pain and elevated blood pressure.  These are chronic issues for the patient, she reports her hands have been causing her pain over the past 6 months since a fall.  She denies any change to her hand pain reports that they always hurt she has been using Tylenol without improvement.  She denies any new fall or injury of her hands.  On evaluation her hands are atraumatic in appearance.  Capillary refill and sensation intact.  Compartments are soft.  She is strong and equal radial pulses.  She is able to grip both hands on exam.  I attempted to perform for hand exam bilaterally however patient is strongly opposed to evaluation of her hands, she questions why I need to even look at her hands today since she always comes in for this and they get looked at every single time.  I explained to the patient the reasoning behind this, she did allow a limited exam today.  I do not appreciate any obvious signs of cellulitis, septic joint, compartment syndrome or other emergent pathology in regards to her hands today.  Overall her motion appears intact but somewhat limited due to pain.  She declined further evaluation of her hands today, I do not feel it radiographs are indicated today given no change to her symptoms or recent injury.  She  plans to use OTC Tylenol, I discussed with her OTC diclofenac gel which she may find helpful, she has not tried that before.  As the patient's blood pressure she reports that she just knows it is high because her hands hurt and this causes her to have pain and the pain then elevates her blood pressure.  She does report chronic vision issues for which she is waiting to see an eye doctor, she is in no change to her vision recently.  Initially her blood pressure was 160s systolic in triage, when I initially about the patient the blood pressure cuff was over her jacket.  I took her arm out of the jacket and we rechecked her blood pressure it is 158/74.  She has no symptoms to suggest hypertensive urgency/emergency at this time, no chest pain or shortness of breath or other concerning symptoms.  there is no indication for labs or imaging at this time.  Discussed with Elpidio Anis today who agrees with plan.  It appears patient may have primarily just been here for a social work consult, she tells me that she would like to speak with them because she is having trouble paying her light bill at home.  Amount and/or Complexity of Data Reviewed External Data Reviewed: notes.    Details: I reviewed patient's ER visit from 06/15/2022.  It appears she had been complaining of chronic hand pain at that visit as well.  She was requesting a wrist brace.  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  She was discharged.  Patient was also seen on 06/11/2022 her main complaint at that time was high blood pressure.  Ultimately she was encouraged to follow-up with her primary care but patient appears to have  been opposed to that.  I reviewed radiology interpretation of the CT of the cervical spine obtained on 05/31/2022.  There is no evidence for acute fracture that time she did have anterior cervical  spine fusion from C3-C7.  Radiographs of the right hand obtained on 04/20/2022 were interpreted by radiologist as degenerative changes without acute findings.  Radiographs of the left hand were obtained in 03/24/2022 and were interpreted as no acute abnormalities by radiology.   At this time there does not appear to be any evidence of an acute emergency medical condition and the patient appears stable for discharge with appropriate outpatient follow up. Diagnosis was discussed with patient who verbalizes understanding of care plan and is agreeable to discharge. I have discussed return precautions with patient who verbalizes understanding. Patient encouraged to follow-up with their PCP and hand specialist. All questions answered.  Patient's case discussed with Dr. Elpidio Anis who agrees with plan to discharge with follow-up.   Note: Portions of this report may have been transcribed using voice recognition software. Every effort was made to ensure accuracy; however, inadvertent computerized transcription errors may still be present.         Final Clinical Impression(s) / ED Diagnoses Final diagnoses:  Elevated blood pressure reading  Bilateral hand pain    Rx / DC Orders ED Discharge Orders     None         Elizabeth Palau 06/17/22 1452    Mardene Sayer, MD 06/18/22 1031

## 2022-06-17 NOTE — ED Triage Notes (Signed)
Pt reports bilateral hand pain, headache, and hypertension. She was here earlier today for same complaints, states "the doctor didn't give me any medication."

## 2022-06-18 MED ORDER — ACETAMINOPHEN 500 MG PO TABS
1000.0000 mg | ORAL_TABLET | Freq: Once | ORAL | Status: AC
  Administered 2022-06-18: 1000 mg via ORAL
  Filled 2022-06-18: qty 2

## 2022-06-18 NOTE — Discharge Instructions (Addendum)
I want to continue with over-the-counter pain medications, I do recommend obtaining diclofenac cream which you find on the counter and apply to your hands, should  help with pain.  Please follow-up with orthopedics in regards to your hand pain, also like to follow-up with neurology in regards to your headaches.  Come back to the emergency department if you develop chest pain, shortness of breath, severe abdominal pain, uncontrolled nausea, vomiting, diarrhea.
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
## 2022-06-19 ENCOUNTER — Other Ambulatory Visit: Payer: Self-pay

## 2022-06-19 ENCOUNTER — Other Ambulatory Visit: Payer: Self-pay | Admitting: Internal Medicine

## 2022-06-19 ENCOUNTER — Encounter (HOSPITAL_COMMUNITY): Payer: Self-pay

## 2022-06-19 ENCOUNTER — Emergency Department (HOSPITAL_COMMUNITY)
Admission: EM | Admit: 2022-06-19 | Discharge: 2022-06-19 | Disposition: A | Discharge status: Home / Self Care | Payer: 59 | Attending: Emergency Medicine | Admitting: Emergency Medicine

## 2022-06-19 DIAGNOSIS — E1169 Type 2 diabetes mellitus with other specified complication: Secondary | ICD-10-CM

## 2022-06-19 DIAGNOSIS — I10 Essential (primary) hypertension: Secondary | ICD-10-CM | POA: Diagnosis not present

## 2022-06-19 DIAGNOSIS — Z8673 Personal history of transient ischemic attack (TIA), and cerebral infarction without residual deficits: Secondary | ICD-10-CM

## 2022-06-19 DIAGNOSIS — M79642 Pain in left hand: Secondary | ICD-10-CM | POA: Diagnosis not present

## 2022-06-19 DIAGNOSIS — M79641 Pain in right hand: Secondary | ICD-10-CM | POA: Diagnosis present

## 2022-06-19 DIAGNOSIS — H538 Other visual disturbances: Secondary | ICD-10-CM | POA: Insufficient documentation
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
## 2022-06-19 NOTE — ED Triage Notes (Signed)
Pt arrives with c/o bilateral hand pain and HTN. Pt seen for the same 2 days ago. Pt denies CP or SOB.

## 2022-06-20 ENCOUNTER — Emergency Department (HOSPITAL_COMMUNITY)
Admission: EM | Admit: 2022-06-20 | Discharge: 2022-06-20 | Disposition: A | Discharge status: Home / Self Care | Payer: 59 | Attending: Emergency Medicine | Admitting: Emergency Medicine

## 2022-06-20 DIAGNOSIS — M79641 Pain in right hand: Secondary | ICD-10-CM | POA: Diagnosis present

## 2022-06-20 DIAGNOSIS — I10 Essential (primary) hypertension: Secondary | ICD-10-CM | POA: Diagnosis not present

## 2022-06-20 DIAGNOSIS — Z8673 Personal history of transient ischemic attack (TIA), and cerebral infarction without residual deficits: Secondary | ICD-10-CM | POA: Insufficient documentation

## 2022-06-20 DIAGNOSIS — M79642 Pain in left hand: Secondary | ICD-10-CM | POA: Insufficient documentation

## 2022-06-20 DIAGNOSIS — E119 Type 2 diabetes mellitus without complications: Secondary | ICD-10-CM | POA: Insufficient documentation

## 2022-06-20 DIAGNOSIS — R519 Headache, unspecified: Secondary | ICD-10-CM | POA: Diagnosis not present

## 2022-06-20 MED ORDER — HYDRALAZINE HCL 25 MG PO TABS
50.0000 mg | ORAL_TABLET | Freq: Once | ORAL | Status: AC
  Administered 2022-06-20: 50 mg via ORAL
  Filled 2022-06-20: qty 2

## 2022-06-20 MED ORDER — TRAMADOL HCL 50 MG PO TABS
50.0000 mg | ORAL_TABLET | Freq: Once | ORAL | Status: AC
  Administered 2022-06-20: 50 mg via ORAL
  Filled 2022-06-20: qty 1

## 2022-06-20 MED ORDER — ACETAMINOPHEN 500 MG PO TABS
1000.0000 mg | ORAL_TABLET | Freq: Once | ORAL | Status: AC
  Administered 2022-06-20: 1000 mg via ORAL
  Filled 2022-06-20: qty 2

## 2022-06-20 NOTE — Discharge Instructions (Signed)
You were evaluated in the Emergency Department and after careful evaluation, we did not find any emergent condition requiring admission or further testing in the hospital.  Your exam/testing today was overall reassuring.  Please return to the Emergency Department if you experience any worsening of your condition.  Thank you for allowing us to be a part of your care.  
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
## 2022-06-20 NOTE — ED Notes (Signed)
Safe transport contacted for transport home. Spoke with Alona Bene

## 2022-06-20 NOTE — ED Provider Notes (Signed)
[facility]-EMERGENCY DEPT [facility] Emergency Department Provider Note MRN:  [id number]  Arrival date & time: 06/20/22     Chief Complaint   Hand pain History of Present Illness   [name] is a 77 y.o. year-old female with a history of diabetes, stroke, hypertension presenting to the ED with chief complaint of hand pain.  Persistent hand pain since before the new year.  No significant changes recently.  Review of Systems  A thorough review of systems was obtained and all systems are negative except as noted in the HPI and PMH.   Patient's Health History    Past Medical History:  Diagnosis Date   Diabetes mellitus without complication (HCC)    GERD 09/04/2006   Qualifier: Diagnosis of  By: Duke Salvia     History of echocardiogram    a. 2D ECHO: 11/06/2013 EF 65%; no WMA. Mild TR. PA pk pressure 43 mm HG   Hypertension    Stroke [facility])     Past Surgical History:  Procedure Laterality Date   ABDOMINAL HYSTERECTOMY     ANTERIOR CERVICAL DECOMPRESSION/DISCECTOMY FUSION 4 LEVELS N/A 11/11/2019   Procedure: Cervical three-four Cervical four-five Cervical five-six Cervical six-seven  Anterior cervical decompression/discectomy/fusion;  Surgeon: Maeola Harman, MD;  Location: [facility] OR;  Service: Neurosurgery;  Laterality: N/A;   LEFT HEART CATHETERIZATION WITH CORONARY ANGIOGRAM N/A 11/05/2013   Procedure: LEFT HEART CATHETERIZATION WITH CORONARY ANGIOGRAM;  Surgeon: Lennette Bihari, MD;  Location: [facility] CATH LAB;  Service: Cardiovascular;  Laterality: N/A;    No family history on file.  Social History   Socioeconomic History   Marital status: Widowed    Spouse name: Not on file   Number of children: Not on file   Years of education: Not on file   Highest education level: Not on file  Occupational History   Not on file  Tobacco Use   Smoking status: Never   Smokeless tobacco: Never  Vaping Use   Vaping Use: Never used  Substance and Sexual Activity   Alcohol use: No   Drug  use: No   Sexual activity: Not Currently  Other Topics Concern   Not on file  Social History Narrative   Not on file   Social Determinants of Health   Financial Resource Strain: Low Risk  (10/14/2020)   Overall Financial Resource Strain (CARDIA)    Difficulty of Paying Living Expenses: Not hard at all  Food Insecurity: No Food Insecurity (10/14/2020)   Hunger Vital Sign    Worried About Running Out of Food in the Last Year: Never true    Ran Out of Food in the Last Year: Never true  Transportation Needs: No Transportation Needs (10/14/2020)   PRAPARE - Administrator, Civil Service (Medical): No    Lack of Transportation (Non-Medical): No  Physical Activity: Insufficiently Active (10/14/2020)   Exercise Vital Sign    Days of Exercise per Week: 2 days    Minutes of Exercise per Session: 60 min  Stress: No Stress Concern Present (10/14/2020)   [employer] of Occupational Health - Occupational Stress Questionnaire    Feeling of Stress : Not at all  Social Connections: Socially Isolated (10/14/2020)   Social Connection and Isolation Panel [NHANES]    Frequency of Communication with Friends and Family: Three times a week    Frequency of Social Gatherings with Friends and Family: Never    Attends Religious Services: Never    Database administrator or Organizations: No  Attends Banker Meetings: Never    Marital Status: Widowed  Intimate Partner Violence: Not At Risk (10/14/2020)   Humiliation, Afraid, Rape, and Kick questionnaire    Fear of Current or Ex-Partner: No    Emotionally Abused: No    Physically Abused: No    Sexually Abused: No     Physical Exam   Vitals:   06/20/22 0303 06/20/22 0400  BP: (!) 227/80 (!) 170/76  Pulse: (!) 58 61  Resp: 16 17  Temp: (!) 97.4 F (36.3 C)   SpO2: 99% 98%    CONSTITUTIONAL: Well-appearing, NAD NEURO/PSYCH:  Alert and oriented to name, moves all extremities equally EYES:  eyes equal and  reactive ENT/NECK:  no LAD, no JVD CARDIO: Regular rate, well-perfused, normal S1 and S2 PULM:  CTAB no wheezing or rhonchi GI/GU:  non-distended, non-tender MSK/SPINE:  No gross deformities, no edema SKIN:  no rash, atraumatic   *Additional and/or pertinent findings included in MDM below  Diagnostic and Interventional Summary    EKG Interpretation  Date/Time:    Ventricular Rate:    PR Interval:    QRS Duration:   QT Interval:    QTC Calculation:   R Axis:     Text Interpretation:         Labs Reviewed - No data to display  No orders to display    Medications  traMADol (ULTRAM) tablet 50 mg (50 mg Oral Given 06/20/22 0329)  acetaminophen (TYLENOL) tablet 1,000 mg (1,000 mg Oral Given 06/20/22 0329)  hydrALAZINE (APRESOLINE) tablet 50 mg (50 mg Oral Given 06/20/22 0329)     Procedures  /  Critical Care Procedures  ED Course and Medical Decision Making  Initial Impression and Ddx Suspect patient has underlying dementia.  She is a frequent visitor to the emergency department for the same chronic symptoms.  She is very confused as to when her injury occurred.  There really does not seem to be any acute complaints this evening.  She denies abdominal pain, abdomen is soft, lungs are clear, no increased work of breathing, vitals do show a fairly significant hypertension, seems to be trending upward over the past few visits.  Suspicious for medication noncompliance.  Providing pain medicine, blood pressure medicine, will reassess.  Past medical/surgical history that increases complexity of ED encounter: None  Interpretation of Diagnostics Laboratory and/or imaging options to aid in the diagnosis/care of the patient were considered.  After careful history and physical examination, it was determined that there was no indication for diagnostics at this time.  Patient Reassessment and Ultimate Disposition/Management     Blood pressure improving, patient feels better, no emergent  process, appropriate for discharge.  Unfortunately unable to reach patient's daughter for further information.  Patient management required discussion with the following services or consulting groups:  None  Complexity of Problems Addressed Acute complicated illness or Injury  Additional Data Reviewed and Analyzed Further history obtained from: Prior ED visit notes  Additional Factors Impacting ED Encounter Risk None  Elmer Sow. Pilar Plate, MD [facility] Emergency Medicine [facility] [email or web address]  Final Clinical Impressions(s) / ED Diagnoses     ICD-10-CM   1. Pain in both hands  M79.641    M79.642       ED Discharge Orders     None        Discharge Instructions Discussed with and Provided to Patient:     Discharge Instructions      You were evaluated in the  Emergency Department and after careful evaluation, we did not find any emergent condition requiring admission or further testing in the hospital.  Your exam/testing today was overall reassuring.  Please return to the Emergency Department if you experience any worsening of your condition.  Thank you for allowing [place] to be a part of your care.        Sabas Sous, MD 06/20/22 [phone]

## 2022-06-26 ENCOUNTER — Other Ambulatory Visit: Payer: Self-pay

## 2022-06-26 ENCOUNTER — Emergency Department (HOSPITAL_COMMUNITY)
Admission: EM | Admit: 2022-06-26 | Discharge: 2022-06-26 | Disposition: A | Discharge status: Home / Self Care | Payer: 59 | Attending: Emergency Medicine | Admitting: Emergency Medicine

## 2022-06-26 ENCOUNTER — Encounter (HOSPITAL_COMMUNITY): Payer: Self-pay

## 2022-06-26 DIAGNOSIS — M79604 Pain in right leg: Secondary | ICD-10-CM | POA: Diagnosis present

## 2022-06-26 DIAGNOSIS — M79605 Pain in left leg: Secondary | ICD-10-CM | POA: Insufficient documentation

## 2022-06-26 DIAGNOSIS — Z7902 Long term (current) use of antithrombotics/antiplatelets: Secondary | ICD-10-CM | POA: Insufficient documentation

## 2022-06-26 DIAGNOSIS — I10 Essential (primary) hypertension: Secondary | ICD-10-CM | POA: Insufficient documentation

## 2022-06-26 DIAGNOSIS — Z79899 Other long term (current) drug therapy: Secondary | ICD-10-CM | POA: Insufficient documentation

## 2022-06-26 DIAGNOSIS — E119 Type 2 diabetes mellitus without complications: Secondary | ICD-10-CM | POA: Diagnosis not present

## 2022-06-26 DIAGNOSIS — Z7984 Long term (current) use of oral hypoglycemic drugs: Secondary | ICD-10-CM | POA: Diagnosis not present

## 2022-06-26 MED ORDER — HYDROCODONE-ACETAMINOPHEN 5-325 MG PO TABS
1.0000 | ORAL_TABLET | Freq: Once | ORAL | Status: AC
  Administered 2022-06-26: 1 via ORAL
  Filled 2022-06-26: qty 1

## 2022-06-26 MED ORDER — LIDOCAINE 5 % EX PTCH: 1.0000 | MEDICATED_PATCH | CUTANEOUS | 0 refills | Status: DC

## 2022-06-26 MED ORDER — LIDOCAINE 5 % EX PTCH
1.0000 | MEDICATED_PATCH | CUTANEOUS | Status: DC
  Administered 2022-06-26: 1 via TRANSDERMAL
  Filled 2022-06-26: qty 1

## 2022-06-26 NOTE — Discharge Instructions (Addendum)
Return to the ED with any new symptoms that are in need of further management and workup Please pick up Lidoderm patches prescribed your pharmacy on file Please continue taking Tylenol for pain at home Please follow-up with PCP, [facility], which I referred you to
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
## 2022-06-26 NOTE — ED Provider Triage Note (Signed)
Emergency Medicine Provider Triage Evaluation Note  [name] , a 77 y.o. female  was evaluated in triage.  Pt complains of complains of bilateral leg pain, and left wrist pain.  Has a left wrist brace in place.  States this has been going on for a long time.  No new complaints.  Review of Systems  Positive: As above Negative: As above   Physical Exam  BP (!) 114/57 (BP Location: Left Arm)   Pulse (!) 55   Temp 97.9 F (36.6 C)   Resp 16   Ht 5\' 4"  (1.626 m)   Wt 61.2 kg   SpO2 99%   BMI 23.16 kg/m  Gen:   Awake, no distress   Resp:  Normal effort  MSK:   Moves extremities without difficulty  Other:    Medical Decision Making  Medically screening exam initiated at 1:04 PM.  Appropriate orders placed.  [name] was informed that the remainder of the evaluation will be completed by another provider, this initial triage assessment does not replace that evaluation, and the importance of remaining in the ED until their evaluation is complete.     [name], PA-C 06/26/22 [phone]

## 2022-06-27 ENCOUNTER — Encounter (HOSPITAL_COMMUNITY): Payer: Self-pay

## 2022-06-27 ENCOUNTER — Emergency Department (HOSPITAL_COMMUNITY)
Admission: EM | Admit: 2022-06-27 | Discharge: 2022-06-27 | Disposition: A | Discharge status: Home / Self Care | Payer: 59 | Attending: Emergency Medicine | Admitting: Emergency Medicine

## 2022-06-27 ENCOUNTER — Other Ambulatory Visit: Payer: Self-pay

## 2022-06-27 DIAGNOSIS — Z79899 Other long term (current) drug therapy: Secondary | ICD-10-CM | POA: Diagnosis not present

## 2022-06-27 DIAGNOSIS — M79641 Pain in right hand: Secondary | ICD-10-CM | POA: Diagnosis present

## 2022-06-27 DIAGNOSIS — M79642 Pain in left hand: Secondary | ICD-10-CM | POA: Insufficient documentation

## 2022-06-27 DIAGNOSIS — M79604 Pain in right leg: Secondary | ICD-10-CM | POA: Diagnosis not present

## 2022-06-27 DIAGNOSIS — M79605 Pain in left leg: Secondary | ICD-10-CM | POA: Insufficient documentation

## 2022-06-27 DIAGNOSIS — E119 Type 2 diabetes mellitus without complications: Secondary | ICD-10-CM | POA: Diagnosis not present

## 2022-06-27 DIAGNOSIS — I10 Essential (primary) hypertension: Secondary | ICD-10-CM | POA: Diagnosis not present

## 2022-06-27 DIAGNOSIS — Z7984 Long term (current) use of oral hypoglycemic drugs: Secondary | ICD-10-CM | POA: Insufficient documentation

## 2022-06-27 MED ORDER — ACETAMINOPHEN 500 MG PO TABS
1000.0000 mg | ORAL_TABLET | Freq: Once | ORAL | Status: AC
  Administered 2022-06-27: 1000 mg via ORAL
  Filled 2022-06-27: qty 2

## 2022-06-27 NOTE — Discharge Instructions (Signed)
You were seen in the emergency department for hand pain. This appears to be a chronic problem you have been dealing with. If symptoms persist, please follow up with a PCP. Otherwise, return to the ER if symptoms are worsening.

## 2022-06-27 NOTE — ED Triage Notes (Signed)
Pt c/o ongoing pain to both hands and both knees for the past several weeks.

## 2022-06-27 NOTE — ED Notes (Signed)
Alert no distress 
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
## 2022-06-28 ENCOUNTER — Encounter (HOSPITAL_COMMUNITY): Payer: Self-pay

## 2022-06-28 ENCOUNTER — Emergency Department (HOSPITAL_COMMUNITY)
Admission: EM | Admit: 2022-06-28 | Discharge: 2022-06-28 | Disposition: A | Discharge status: Home / Self Care | Payer: 59 | Attending: Emergency Medicine | Admitting: Emergency Medicine

## 2022-06-28 ENCOUNTER — Other Ambulatory Visit: Payer: Self-pay

## 2022-06-28 DIAGNOSIS — M79621 Pain in right upper arm: Secondary | ICD-10-CM | POA: Diagnosis not present

## 2022-06-28 DIAGNOSIS — M79622 Pain in left upper arm: Secondary | ICD-10-CM | POA: Insufficient documentation

## 2022-06-28 DIAGNOSIS — M79609 Pain in unspecified limb: Secondary | ICD-10-CM

## 2022-06-28 DIAGNOSIS — M79662 Pain in left lower leg: Secondary | ICD-10-CM | POA: Diagnosis not present

## 2022-06-28 DIAGNOSIS — M79661 Pain in right lower leg: Secondary | ICD-10-CM | POA: Insufficient documentation

## 2022-06-28 MED ORDER — ACETAMINOPHEN 500 MG PO TABS: 500.0000 mg | ORAL_TABLET | Freq: Four times a day (QID) | ORAL | 0 refills | Status: DC | PRN

## 2022-06-28 NOTE — ED Triage Notes (Signed)
Pt came in via POV d/t both of her hands & legs hurting for a "long time." A/Ox4, no recent falls or injuries, rates pain 9/10 while in triage.

## 2022-06-28 NOTE — ED Notes (Signed)
Discharge instructions discussed with pt. Verbalized understanding. VSS. No questions or concerns regarding discharge  

## 2022-06-28 NOTE — ED Provider Notes (Signed)
[place] EMERGENCY DEPARTMENT AT [facility] Provider Note   CSN: [id number] Arrival date & time: 06/28/22  1447     History Chief Complaint  Patient presents with   Hand Pain   Leg Pain    HPI [name] is a 77 y.o. female presenting for continuation of the same complaints.  Seen 49 times in the past 6 months for similar chronic joint pain bilateral upper and lower extremities all throughout her body.  Is requesting a refill of the Tylenol which was prescribed to her which she states is the reason she checked in today.   Patient's recorded medical, surgical, social, medication list and allergies were reviewed in the Snapshot window as part of the initial history.   Review of Systems   Review of Systems  Constitutional:  Negative for chills and fever.  HENT:  Negative for ear pain and sore throat.   Eyes:  Negative for pain and visual disturbance.  Respiratory:  Negative for cough and shortness of breath.   Cardiovascular:  Negative for chest pain and palpitations.  Gastrointestinal:  Negative for abdominal pain and vomiting.  Genitourinary:  Negative for dysuria and hematuria.  Musculoskeletal:  Positive for arthralgias. Negative for back pain.  Skin:  Negative for color change and rash.  Neurological:  Negative for seizures and syncope.  All other systems reviewed and are negative.   Physical Exam Updated Vital Signs BP 115/72 (BP Location: Right Arm)   Pulse 62   Temp 97.9 F (36.6 C) (Oral)   Resp 16   SpO2 98%  Physical Exam Vitals and nursing note reviewed.  Constitutional:      General: She is not in acute distress.    Appearance: She is well-developed.  HENT:     Head: Normocephalic and atraumatic.  Eyes:     Conjunctiva/sclera: Conjunctivae normal.  Cardiovascular:     Rate and Rhythm: Normal rate and regular rhythm.     Heart sounds: No murmur heard. Pulmonary:     Effort: Pulmonary effort is normal. No respiratory distress.     Breath  sounds: Normal breath sounds.  Abdominal:     General: There is no distension.     Palpations: Abdomen is soft.     Tenderness: There is no abdominal tenderness. There is no right CVA tenderness or left CVA tenderness.  Musculoskeletal:        General: No swelling or tenderness. Normal range of motion.     Cervical back: Neck supple.  Skin:    General: Skin is warm and dry.  Neurological:     General: No focal deficit present.     Mental Status: She is alert and oriented to person, place, and time. Mental status is at baseline.     Cranial Nerves: No cranial nerve deficit.      ED Course/ Medical Decision Making/ A&P    Procedures Procedures   Medications Ordered in ED Medications - No data to display  Medical Decision Making:   Ms. Norbury is presenting with a chief complaint of full body arthralgias evaluated multiple times for similar.  Screening lab work in the last month nondiagnostic has been seen 49 times over the past 6 months for the symptoms. No acute indication further intervention based on current history presents with exam findings referred back to PCP for ongoing care and management.  Disposition:  I have considered need for hospitalization, however, considering all of the above, I believe this patient is stable for discharge  at this time.  Patient/family educated about specific return precautions for given chief complaint and symptoms.  Patient/family educated about follow-up with PCP .     Patient/family expressed understanding of return precautions and need for follow-up. Patient spoken to regarding all imaging and laboratory results and appropriate follow up for these results. All education provided in verbal form with additional information in written form. Time was allowed for answering of patient questions. Patient discharged.    Emergency Department Medication Summary:   Medications - No data to display      Clinical Impression:  1. Pain in extremity,  unspecified extremity      Discharge   Final Clinical Impression(s) / ED Diagnoses Final diagnoses:  Pain in extremity, unspecified extremity    Rx / DC Orders ED Discharge Orders          Ordered    acetaminophen (TYLENOL) 500 MG tablet  Every 6 hours PRN        06/28/22 2129              Glyn Ade, MD 06/28/22 2130

## 2022-06-29 ENCOUNTER — Emergency Department (HOSPITAL_COMMUNITY)
Admission: EM | Admit: 2022-06-29 | Discharge: 2022-06-29 | Disposition: A | Discharge status: Home / Self Care | Payer: 59 | Attending: Emergency Medicine | Admitting: Emergency Medicine

## 2022-06-29 ENCOUNTER — Other Ambulatory Visit: Payer: Self-pay

## 2022-06-29 DIAGNOSIS — G8929 Other chronic pain: Secondary | ICD-10-CM | POA: Insufficient documentation

## 2022-06-29 DIAGNOSIS — Z7901 Long term (current) use of anticoagulants: Secondary | ICD-10-CM | POA: Insufficient documentation

## 2022-06-29 DIAGNOSIS — M79643 Pain in unspecified hand: Secondary | ICD-10-CM | POA: Insufficient documentation

## 2022-06-29 NOTE — ED Provider Notes (Signed)
[place] EMERGENCY DEPARTMENT AT [facility] Provider Note   CSN: [id number] Arrival date & time: 06/29/22  1458     History  Chief Complaint  Patient presents with   Hand Pain    [name] is a 77 y.o. female.  Patient reports she came to the emergency department for pain in her hands.  Patient reports that she has had pain in her hands for a long time.  Patient denies any new injury.  Patient has been taking Tylenol for pain.  Patient states she cannot follow-up with her primary care physician because the office is too far away.  Patient states that she cannot follow-up with orthopedist.  The history is provided by the patient. No language interpreter was used.  Hand Pain This is a recurrent problem. The problem occurs constantly. The problem has not changed since onset.Nothing aggravates the symptoms. Nothing relieves the symptoms.       Home Medications Prior to Admission medications   Medication Sig Start Date End Date Taking? Authorizing Provider  acetaminophen (TYLENOL) 500 MG tablet Take 1 tablet (500 mg total) by mouth every 6 (six) hours as needed. 06/28/22   Glyn Ade, MD  albuterol (VENTOLIN HFA) 108 (90 Base) MCG/ACT inhaler Inhale 1-2 puffs into the lungs every 6 (six) hours as needed for wheezing or shortness of breath. 04/22/20   Barbette Merino, NP  amLODipine (NORVASC) 10 MG tablet Take 1 tablet (10 mg total) by mouth daily. 05/31/22   Dione Booze, MD  chlorthalidone (HYGROTON) 25 MG tablet TAKE 1 TABLET (25 MG TOTAL) BY MOUTH DAILY. 11/20/21 11/20/22  Donell Beers, FNP  clopidogrel (PLAVIX) 75 MG tablet Take 1 tablet (75 mg total) by mouth daily. 06/15/21   Elige Radon, MD  diclofenac Sodium (VOLTAREN) 1 % GEL Apply 2 g topically 4 (four) times daily. 05/22/22   Karie Mainland, Amjad, PA-C  gabapentin (NEURONTIN) 300 MG capsule Take 1 capsule (300 mg total) by mouth 3 (three) times daily. 03/19/22 04/18/22  Alvira Monday, MD  lidocaine  (LIDODERM) 5 % Place 1 patch onto the skin daily. Remove & Discard patch within 12 hours or as directed by MD 06/26/22   Al Decant, PA-C  losartan (COZAAR) 50 MG tablet Take 1 tablet (50 mg total) by mouth daily. 04/20/22 05/20/22  Roemhildt, Lorin T, PA-C  metFORMIN (GLUCOPHAGE) 500 MG tablet Take 1 tablet (500 mg total) by mouth 2 (two) times daily with a meal. 09/05/21 09/05/22  [name] A, DO  pantoprazole (PROTONIX) 20 MG tablet Take 1 tablet (20 mg total) by mouth daily. 10/10/21   [name], MD  rosuvastatin (CRESTOR) 10 MG tablet Take 1 tablet (10 mg total) by mouth at bedtime. 06/15/21 06/15/22  Elige Radon, MD  traMADol (ULTRAM) 50 MG tablet Take 1 tablet (50 mg total) by mouth every 6 (six) hours as needed. 05/31/22   Dione Booze, MD      Allergies    Codeine, Ibuprofen, and Imdur [isosorbide nitrate]    Review of Systems   Review of Systems  Musculoskeletal:  Positive for arthralgias and myalgias.  All other systems reviewed and are negative.   Physical Exam Updated Vital Signs BP (!) 154/58 (BP Location: Right Arm)   Pulse (!) 57   Temp 97.9 F (36.6 C) (Oral)   Resp 18   Ht 5\' 4"  (1.626 m)   Wt 60.8 kg   SpO2 100%   BMI 23.01 kg/m  Physical Exam Vitals and nursing note  reviewed.  Constitutional:      Appearance: She is well-developed.  HENT:     Head: Normocephalic.  Cardiovascular:     Rate and Rhythm: Normal rate.  Pulmonary:     Effort: Pulmonary effort is normal.  Abdominal:     General: There is no distension.  Musculoskeletal:        General: Normal range of motion.     Cervical back: Normal range of motion.     Comments: Patient has full range of motion of bilateral hands, I do not see any evidence of injury, no swelling no deformities neurovascular neurosensory are intact  Skin:    General: Skin is warm.  Neurological:     General: No focal deficit present.     Mental Status: She is alert and oriented to person, place, and time.      ED Results / Procedures / Treatments   Labs (all labs ordered are listed, but only abnormal results are displayed) Labs Reviewed - No data to display  EKG None  Radiology No results found.  Procedures Procedures    Medications Ordered in ED Medications - No data to display  ED Course/ Medical Decision Making/ A&P                             Medical Decision Making Presents to the emergency department complaining of pain in both hands.  Risk Risk Details: Has been to the emergency department multiple times complaining of hand pain.  She has a normal exam.  I advised her the importance of following up with her primary care physician.   Patient counseled on the need to follow-up with her primary care physician.  Patient advised coming to the emergency department does not replace the care that she gets from her primary care physician and that she can continue to take Tylenol for hand discomfort.        Final Clinical Impression(s) / ED Diagnoses Final diagnoses:  Other chronic pain    Rx / DC Orders ED Discharge Orders     None     An After Visit Summary was printed and given to the patient.     Osie Cheeks 06/29/22 2151    [name], MD 06/30/22 [phone]

## 2022-06-29 NOTE — Discharge Instructions (Addendum)
Follow up with your primary care physicain for recheck.

## 2022-06-29 NOTE — ED Triage Notes (Signed)
Pt complains of bilateral hand pain and bilateral leg pain, ongoing for months.

## 2022-07-01 ENCOUNTER — Emergency Department (HOSPITAL_COMMUNITY)
Admission: EM | Admit: 2022-07-01 | Discharge: 2022-07-01 | Disposition: A | Discharge status: Home / Self Care | Payer: 59 | Attending: Emergency Medicine | Admitting: Emergency Medicine

## 2022-07-01 ENCOUNTER — Other Ambulatory Visit: Payer: Self-pay

## 2022-07-01 DIAGNOSIS — G8929 Other chronic pain: Secondary | ICD-10-CM | POA: Insufficient documentation

## 2022-07-01 DIAGNOSIS — M791 Myalgia, unspecified site: Secondary | ICD-10-CM | POA: Insufficient documentation

## 2022-07-01 MED ORDER — ARTIFICIAL TEARS OPHTHALMIC OINT
TOPICAL_OINTMENT | Freq: Once | OPHTHALMIC | Status: AC
  Administered 2022-07-01: 1 via OPHTHALMIC
  Filled 2022-07-01: qty 3.5

## 2022-07-01 NOTE — ED Notes (Signed)
Called [facility] to take patient home. Cab #3.

## 2022-07-01 NOTE — ED Triage Notes (Signed)
Pt c/o gen body aches, and dry eyes.

## 2022-07-01 NOTE — ED Provider Notes (Signed)
[place] EMERGENCY DEPARTMENT AT [facility] Provider Note   CSN: [id number] Arrival date & time: 07/01/22  1726     History  Chief Complaint  Patient presents with   Generalized Body Aches    [name] is a 77 y.o. female.  77 year old female who is well-known to this department here complaining of chronic pain.  Seen multiple times for similar symptoms.  Denies any new injury.  She has had dry eyes.  Denies any vision loss.  States that pain has been unrelieved with her home medications.       Home Medications Prior to Admission medications   Medication Sig Start Date End Date Taking? Authorizing Provider  acetaminophen (TYLENOL) 500 MG tablet Take 1 tablet (500 mg total) by mouth every 6 (six) hours as needed. 06/28/22   Glyn Ade, MD  albuterol (VENTOLIN HFA) 108 (90 Base) MCG/ACT inhaler Inhale 1-2 puffs into the lungs every 6 (six) hours as needed for wheezing or shortness of breath. 04/22/20   Barbette Merino, NP  amLODipine (NORVASC) 10 MG tablet Take 1 tablet (10 mg total) by mouth daily. 05/31/22   Dione Booze, MD  chlorthalidone (HYGROTON) 25 MG tablet TAKE 1 TABLET (25 MG TOTAL) BY MOUTH DAILY. 11/20/21 11/20/22  Donell Beers, FNP  clopidogrel (PLAVIX) 75 MG tablet Take 1 tablet (75 mg total) by mouth daily. 06/15/21   Elige Radon, MD  diclofenac Sodium (VOLTAREN) 1 % GEL Apply 2 g topically 4 (four) times daily. 05/22/22   Karie Mainland, Amjad, PA-C  gabapentin (NEURONTIN) 300 MG capsule Take 1 capsule (300 mg total) by mouth 3 (three) times daily. 03/19/22 04/18/22  Alvira Monday, MD  lidocaine (LIDODERM) 5 % Place 1 patch onto the skin daily. Remove & Discard patch within 12 hours or as directed by MD 06/26/22   Al Decant, PA-C  losartan (COZAAR) 50 MG tablet Take 1 tablet (50 mg total) by mouth daily. 04/20/22 05/20/22  Roemhildt, Lorin T, PA-C  metFORMIN (GLUCOPHAGE) 500 MG tablet Take 1 tablet (500 mg total) by mouth 2 (two) times daily  with a meal. 09/05/21 09/05/22  [name] A, DO  pantoprazole (PROTONIX) 20 MG tablet Take 1 tablet (20 mg total) by mouth daily. 10/10/21   [name], MD  rosuvastatin (CRESTOR) 10 MG tablet Take 1 tablet (10 mg total) by mouth at bedtime. 06/15/21 06/15/22  Elige Radon, MD  traMADol (ULTRAM) 50 MG tablet Take 1 tablet (50 mg total) by mouth every 6 (six) hours as needed. 05/31/22   Dione Booze, MD      Allergies    Codeine, Ibuprofen, and Imdur [isosorbide nitrate]    Review of Systems   Review of Systems  All other systems reviewed and are negative.   Physical Exam Updated Vital Signs BP (!) 119/52 (BP Location: Left Arm)   Pulse 98   Temp 97.9 F (36.6 C) (Oral)   Resp 18   Ht 1.626 m (5\' 4" )   Wt 60.8 kg   SpO2 99%   BMI 23.01 kg/m  Physical Exam Vitals and nursing note reviewed.  Constitutional:      General: She is not in acute distress.    Appearance: Normal appearance. She is well-developed. She is not toxic-appearing.  HENT:     Head: Normocephalic and atraumatic.  Eyes:     General: Lids are normal. Lids are everted, no foreign bodies appreciated.     Conjunctiva/sclera: Conjunctivae normal.     Pupils: Pupils are  equal, round, and reactive to light.  Neck:     Thyroid: No thyroid mass.     Trachea: No tracheal deviation.  Cardiovascular:     Rate and Rhythm: Normal rate and regular rhythm.     Heart sounds: Normal heart sounds. No murmur heard.    No gallop.  Pulmonary:     Effort: Pulmonary effort is normal. No respiratory distress.     Breath sounds: Normal breath sounds. No stridor. No decreased breath sounds, wheezing, rhonchi or rales.  Abdominal:     General: There is no distension.     Palpations: Abdomen is soft.     Tenderness: There is no abdominal tenderness. There is no rebound.  Musculoskeletal:        General: No tenderness. Normal range of motion.     Cervical back: Normal range of motion and neck supple.  Skin:    General:  Skin is warm and dry.     Findings: No abrasion or rash.  Neurological:     General: No focal deficit present.     Mental Status: She is alert and oriented to person, place, and time. Mental status is at baseline.     GCS: GCS eye subscore is 4. GCS verbal subscore is 5. GCS motor subscore is 6.     Cranial Nerves: No cranial nerve deficit.     Sensory: No sensory deficit.     Motor: Motor function is intact.     Gait: Gait is intact.  Psychiatric:        Attention and Perception: Attention normal.        Speech: Speech normal.        Behavior: Behavior normal.     ED Results / Procedures / Treatments   Labs (all labs ordered are listed, but only abnormal results are displayed) Labs Reviewed - No data to display  EKG None  Radiology No results found.  Procedures Procedures    Medications Ordered in ED Medications  artificial tears (LACRILUBE) ophthalmic ointment (has no administration in time range)    ED Course/ Medical Decision Making/ A&P                             Medical Decision Making  Patient complained of dry eyes and given Lacri-Lube drops.  Patient able to ambulate at this time.  Multiple ED visits recently.  No indication for imaging or blood work at this time.  Will discharge        Final Clinical Impression(s) / ED Diagnoses Final diagnoses:  None    Rx / DC Orders ED Discharge Orders     None         Lorre Nick, MD 07/01/22 1814

## 2022-07-02 ENCOUNTER — Emergency Department (HOSPITAL_COMMUNITY)
Admission: EM | Admit: 2022-07-02 | Discharge: 2022-07-03 | Disposition: A | Discharge status: Home / Self Care | Payer: 59 | Attending: Emergency Medicine | Admitting: Emergency Medicine

## 2022-07-02 ENCOUNTER — Other Ambulatory Visit: Payer: Self-pay

## 2022-07-02 ENCOUNTER — Encounter (HOSPITAL_COMMUNITY): Payer: Self-pay

## 2022-07-02 DIAGNOSIS — E119 Type 2 diabetes mellitus without complications: Secondary | ICD-10-CM | POA: Diagnosis not present

## 2022-07-02 DIAGNOSIS — I10 Essential (primary) hypertension: Secondary | ICD-10-CM | POA: Insufficient documentation

## 2022-07-02 DIAGNOSIS — F419 Anxiety disorder, unspecified: Secondary | ICD-10-CM | POA: Diagnosis not present

## 2022-07-02 DIAGNOSIS — R52 Pain, unspecified: Secondary | ICD-10-CM

## 2022-07-02 DIAGNOSIS — Z7902 Long term (current) use of antithrombotics/antiplatelets: Secondary | ICD-10-CM | POA: Insufficient documentation

## 2022-07-02 DIAGNOSIS — Z79899 Other long term (current) drug therapy: Secondary | ICD-10-CM | POA: Insufficient documentation

## 2022-07-02 DIAGNOSIS — H5789 Other specified disorders of eye and adnexa: Secondary | ICD-10-CM | POA: Diagnosis not present

## 2022-07-02 DIAGNOSIS — M791 Myalgia, unspecified site: Secondary | ICD-10-CM | POA: Insufficient documentation

## 2022-07-02 NOTE — ED Triage Notes (Signed)
Patient BIB POV with complaint of generalized bodyaches.   Patient states "ever [place] been right"

## 2022-07-03 DIAGNOSIS — M791 Myalgia, unspecified site: Secondary | ICD-10-CM | POA: Diagnosis not present

## 2022-07-03 LAB — CBC WITH DIFFERENTIAL/PLATELET
Abs Immature Granulocytes: 0.01 10*3/uL (ref 0.00–0.07)
Basophils Absolute: 0 10*3/uL (ref 0.0–0.1)
Basophils Relative: 1 %
Eosinophils Absolute: 0.1 10*3/uL (ref 0.0–0.5)
Eosinophils Relative: 2 %
HCT: 34.2 % — ABNORMAL LOW (ref 36.0–46.0)
Hemoglobin: 10.8 g/dL — ABNORMAL LOW (ref 12.0–15.0)
Immature Granulocytes: 0 %
Lymphocytes Relative: 32 %
Lymphs Abs: 1.6 10*3/uL (ref 0.7–4.0)
MCH: 28 pg (ref 26.0–34.0)
MCHC: 31.6 g/dL (ref 30.0–36.0)
MCV: 88.6 fL (ref 80.0–100.0)
Monocytes Absolute: 0.4 10*3/uL (ref 0.1–1.0)
Monocytes Relative: 8 %
Neutro Abs: 2.9 10*3/uL (ref 1.7–7.7)
Neutrophils Relative %: 57 %
Platelets: 209 10*3/uL (ref 150–400)
RBC: 3.86 MIL/uL — ABNORMAL LOW (ref 3.87–5.11)
RDW: 14.6 % (ref 11.5–15.5)
WBC: 5.1 10*3/uL (ref 4.0–10.5)
nRBC: 0 % (ref 0.0–0.2)

## 2022-07-03 LAB — COMPREHENSIVE METABOLIC PANEL
ALT: 15 U/L (ref 0–44)
AST: 20 U/L (ref 15–41)
Albumin: 3.7 g/dL (ref 3.5–5.0)
Alkaline Phosphatase: 62 U/L (ref 38–126)
Anion gap: 11 (ref 5–15)
BUN: 15 mg/dL (ref 8–23)
CO2: 28 mmol/L (ref 22–32)
Calcium: 9 mg/dL (ref 8.9–10.3)
Chloride: 100 mmol/L (ref 98–111)
Creatinine, Ser: 1.15 mg/dL — ABNORMAL HIGH (ref 0.44–1.00)
GFR, Estimated: 50 mL/min — ABNORMAL LOW (ref >60)
Glucose, Bld: 101 mg/dL — ABNORMAL HIGH (ref 70–99)
Potassium: 3.6 mmol/L (ref 3.5–5.1)
Sodium: 139 mmol/L (ref 135–145)
Total Bilirubin: 0.5 mg/dL (ref 0.3–1.2)
Total Protein: 7.4 g/dL (ref 6.5–8.1)

## 2022-07-03 LAB — MAGNESIUM: Magnesium: 1.8 mg/dL (ref 1.7–2.4)

## 2022-07-03 LAB — CK: Total CK: 98 U/L (ref 38–234)

## 2022-07-03 MED ORDER — NAPHAZOLINE-GLYCERIN 0.012-0.25 % OP SOLN
1.0000 [drp] | Freq: Once | OPHTHALMIC | Status: AC
  Administered 2022-07-03: 2 [drp] via OPHTHALMIC
  Filled 2022-07-03: qty 15

## 2022-07-03 MED ORDER — METHOCARBAMOL 500 MG PO TABS
500.0000 mg | ORAL_TABLET | Freq: Once | ORAL | Status: AC
  Administered 2022-07-03: 500 mg via ORAL
  Filled 2022-07-03: qty 1

## 2022-07-03 NOTE — Discharge Instructions (Signed)
Use eyedrops as needed.  Continue Tylenol as needed for pain.
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
## 2022-07-05 ENCOUNTER — Other Ambulatory Visit: Payer: Self-pay | Admitting: Internal Medicine

## 2022-07-05 DIAGNOSIS — E1169 Type 2 diabetes mellitus with other specified complication: Secondary | ICD-10-CM

## 2022-07-05 DIAGNOSIS — Z8673 Personal history of transient ischemic attack (TIA), and cerebral infarction without residual deficits: Secondary | ICD-10-CM

## 2022-07-06 NOTE — Telephone Encounter (Signed)
Unfortunate that she will not come in and been seen, would certainly be welcome but we would be unable to continue refilling medications without monitoring. Will decline refill.

## 2022-07-07 ENCOUNTER — Encounter (HOSPITAL_COMMUNITY): Payer: Self-pay | Admitting: Emergency Medicine

## 2022-07-07 ENCOUNTER — Emergency Department (HOSPITAL_COMMUNITY)
Admission: EM | Admit: 2022-07-07 | Discharge: 2022-07-07 | Disposition: A | Discharge status: Home / Self Care | Payer: 59 | Attending: Student | Admitting: Student

## 2022-07-07 ENCOUNTER — Other Ambulatory Visit: Payer: Self-pay

## 2022-07-07 DIAGNOSIS — R52 Pain, unspecified: Secondary | ICD-10-CM | POA: Diagnosis not present

## 2022-07-07 MED ORDER — ACETAMINOPHEN 325 MG PO TABS
650.0000 mg | ORAL_TABLET | Freq: Once | ORAL | Status: AC
  Administered 2022-07-07: 650 mg via ORAL
  Filled 2022-07-07: qty 2

## 2022-07-07 MED ORDER — GABAPENTIN 300 MG PO CAPS
300.0000 mg | ORAL_CAPSULE | Freq: Once | ORAL | Status: AC
  Administered 2022-07-07: 300 mg via ORAL
  Filled 2022-07-07: qty 1
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
## 2022-07-07 NOTE — ED Notes (Signed)
Pt discharged to home, NAD noted. Pt ambulated to waiting room without difficulty.
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
## 2022-07-11 ENCOUNTER — Other Ambulatory Visit: Payer: Self-pay

## 2022-07-11 ENCOUNTER — Emergency Department (HOSPITAL_COMMUNITY)
Admission: EM | Admit: 2022-07-11 | Discharge: 2022-07-11 | Disposition: A | Discharge status: Home / Self Care | Payer: 59 | Attending: Emergency Medicine | Admitting: Emergency Medicine

## 2022-07-11 DIAGNOSIS — Z7902 Long term (current) use of antithrombotics/antiplatelets: Secondary | ICD-10-CM | POA: Insufficient documentation

## 2022-07-11 DIAGNOSIS — M25562 Pain in left knee: Secondary | ICD-10-CM | POA: Insufficient documentation

## 2022-07-11 NOTE — Discharge Instructions (Signed)
Follow up with your family doctor in the office.   
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
## 2022-07-14 ENCOUNTER — Encounter (HOSPITAL_COMMUNITY): Payer: Self-pay

## 2022-07-14 ENCOUNTER — Emergency Department (HOSPITAL_COMMUNITY)
Admission: EM | Admit: 2022-07-14 | Discharge: 2022-07-15 | Disposition: A | Discharge status: Home / Self Care | Payer: 59 | Attending: Emergency Medicine | Admitting: Emergency Medicine

## 2022-07-14 ENCOUNTER — Other Ambulatory Visit: Payer: Self-pay

## 2022-07-14 DIAGNOSIS — Z8673 Personal history of transient ischemic attack (TIA), and cerebral infarction without residual deficits: Secondary | ICD-10-CM | POA: Insufficient documentation

## 2022-07-14 DIAGNOSIS — Z7901 Long term (current) use of anticoagulants: Secondary | ICD-10-CM | POA: Insufficient documentation

## 2022-07-14 DIAGNOSIS — M79641 Pain in right hand: Secondary | ICD-10-CM | POA: Diagnosis present

## 2022-07-14 DIAGNOSIS — Z79899 Other long term (current) drug therapy: Secondary | ICD-10-CM | POA: Insufficient documentation

## 2022-07-14 DIAGNOSIS — Z792 Long term (current) use of antibiotics: Secondary | ICD-10-CM | POA: Diagnosis not present

## 2022-07-14 DIAGNOSIS — M791 Myalgia, unspecified site: Secondary | ICD-10-CM | POA: Insufficient documentation

## 2022-07-14 DIAGNOSIS — E119 Type 2 diabetes mellitus without complications: Secondary | ICD-10-CM | POA: Diagnosis not present

## 2022-07-14 DIAGNOSIS — I10 Essential (primary) hypertension: Secondary | ICD-10-CM | POA: Insufficient documentation

## 2022-07-14 DIAGNOSIS — M79642 Pain in left hand: Secondary | ICD-10-CM | POA: Diagnosis not present

## 2022-07-14 DIAGNOSIS — Z7984 Long term (current) use of oral hypoglycemic drugs: Secondary | ICD-10-CM | POA: Diagnosis not present
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
## 2022-07-15 ENCOUNTER — Emergency Department (HOSPITAL_COMMUNITY)
Admission: EM | Admit: 2022-07-15 | Discharge: 2022-07-15 | Disposition: A | Discharge status: Home / Self Care | Payer: 59 | Source: Home / Self Care | Attending: Emergency Medicine | Admitting: Emergency Medicine

## 2022-07-15 ENCOUNTER — Encounter (HOSPITAL_COMMUNITY): Payer: Self-pay | Admitting: *Deleted

## 2022-07-15 ENCOUNTER — Other Ambulatory Visit: Payer: Self-pay

## 2022-07-15 DIAGNOSIS — Z79899 Other long term (current) drug therapy: Secondary | ICD-10-CM | POA: Insufficient documentation

## 2022-07-15 DIAGNOSIS — M791 Myalgia, unspecified site: Secondary | ICD-10-CM | POA: Insufficient documentation

## 2022-07-15 DIAGNOSIS — W19XXXA Unspecified fall, initial encounter: Secondary | ICD-10-CM | POA: Insufficient documentation

## 2022-07-15 DIAGNOSIS — M79641 Pain in right hand: Secondary | ICD-10-CM | POA: Diagnosis not present

## 2022-07-15 DIAGNOSIS — E119 Type 2 diabetes mellitus without complications: Secondary | ICD-10-CM | POA: Insufficient documentation

## 2022-07-15 DIAGNOSIS — I10 Essential (primary) hypertension: Secondary | ICD-10-CM | POA: Insufficient documentation

## 2022-07-15 DIAGNOSIS — Z7984 Long term (current) use of oral hypoglycemic drugs: Secondary | ICD-10-CM | POA: Insufficient documentation

## 2022-07-15 DIAGNOSIS — Z7901 Long term (current) use of anticoagulants: Secondary | ICD-10-CM | POA: Insufficient documentation

## 2022-07-15 MED ORDER — ACETAMINOPHEN 325 MG PO TABS
650.0000 mg | ORAL_TABLET | Freq: Once | ORAL | Status: AC
  Administered 2022-07-15: 650 mg via ORAL
  Filled 2022-07-15: qty 2

## 2022-07-15 MED ORDER — LIDOCAINE 5 % EX PTCH: 1.0000 | MEDICATED_PATCH | CUTANEOUS | 0 refills | Status: DC

## 2022-07-15 MED ORDER — LIDOCAINE 5 % EX PTCH
1.0000 | MEDICATED_PATCH | Freq: Once | CUTANEOUS | Status: DC
  Administered 2022-07-15: 1 via TRANSDERMAL
  Filled 2022-07-15: qty 1
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
## 2022-07-15 NOTE — Discharge Instructions (Signed)
Take the medications as prescribed to help with pain and discomfort.
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
## 2022-07-17 ENCOUNTER — Other Ambulatory Visit: Payer: Self-pay

## 2022-07-17 ENCOUNTER — Emergency Department (HOSPITAL_COMMUNITY)
Admission: EM | Admit: 2022-07-17 | Discharge: 2022-07-18 | Disposition: A | Discharge status: Home / Self Care | Payer: 59 | Attending: Emergency Medicine | Admitting: Emergency Medicine

## 2022-07-17 DIAGNOSIS — M79643 Pain in unspecified hand: Secondary | ICD-10-CM | POA: Insufficient documentation

## 2022-07-17 DIAGNOSIS — Z7901 Long term (current) use of anticoagulants: Secondary | ICD-10-CM | POA: Insufficient documentation
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
## 2022-07-20 ENCOUNTER — Other Ambulatory Visit: Payer: Self-pay

## 2022-07-20 ENCOUNTER — Emergency Department (HOSPITAL_COMMUNITY)
Admission: EM | Admit: 2022-07-20 | Discharge: 2022-07-20 | Disposition: A | Discharge status: Home / Self Care | Payer: 59 | Attending: Emergency Medicine | Admitting: Emergency Medicine

## 2022-07-20 ENCOUNTER — Encounter (HOSPITAL_COMMUNITY): Payer: Self-pay | Admitting: *Deleted

## 2022-07-20 DIAGNOSIS — Z79899 Other long term (current) drug therapy: Secondary | ICD-10-CM | POA: Insufficient documentation

## 2022-07-20 DIAGNOSIS — I10 Essential (primary) hypertension: Secondary | ICD-10-CM | POA: Diagnosis not present

## 2022-07-20 DIAGNOSIS — M791 Myalgia, unspecified site: Secondary | ICD-10-CM | POA: Diagnosis present

## 2022-07-20 DIAGNOSIS — Z7902 Long term (current) use of antithrombotics/antiplatelets: Secondary | ICD-10-CM | POA: Diagnosis not present

## 2022-07-20 DIAGNOSIS — E119 Type 2 diabetes mellitus without complications: Secondary | ICD-10-CM | POA: Diagnosis not present

## 2022-07-20 DIAGNOSIS — M542 Cervicalgia: Secondary | ICD-10-CM

## 2022-07-20 DIAGNOSIS — Z7984 Long term (current) use of oral hypoglycemic drugs: Secondary | ICD-10-CM | POA: Insufficient documentation

## 2022-07-20 NOTE — Discharge Instructions (Signed)
I recommend following wellness clinic and orthopedics.  Wellness clinic information provided.
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
## 2022-07-25 ENCOUNTER — Encounter (HOSPITAL_COMMUNITY): Payer: Self-pay | Admitting: Emergency Medicine

## 2022-07-25 ENCOUNTER — Other Ambulatory Visit: Payer: Self-pay

## 2022-07-25 ENCOUNTER — Emergency Department (HOSPITAL_COMMUNITY)
Admission: EM | Admit: 2022-07-25 | Discharge: 2022-07-26 | Disposition: A | Discharge status: Home / Self Care | Payer: 59 | Source: Home / Self Care | Attending: Emergency Medicine | Admitting: Emergency Medicine

## 2022-07-25 ENCOUNTER — Emergency Department (HOSPITAL_COMMUNITY)
Admission: EM | Admit: 2022-07-25 | Discharge: 2022-07-25 | Disposition: A | Discharge status: Home / Self Care | Payer: 59 | Attending: Emergency Medicine | Admitting: Emergency Medicine

## 2022-07-25 DIAGNOSIS — M79641 Pain in right hand: Secondary | ICD-10-CM | POA: Insufficient documentation

## 2022-07-25 DIAGNOSIS — Z7984 Long term (current) use of oral hypoglycemic drugs: Secondary | ICD-10-CM | POA: Insufficient documentation

## 2022-07-25 DIAGNOSIS — G8929 Other chronic pain: Secondary | ICD-10-CM | POA: Insufficient documentation

## 2022-07-25 DIAGNOSIS — Z7902 Long term (current) use of antithrombotics/antiplatelets: Secondary | ICD-10-CM | POA: Insufficient documentation

## 2022-07-25 DIAGNOSIS — M542 Cervicalgia: Secondary | ICD-10-CM | POA: Diagnosis present

## 2022-07-25 DIAGNOSIS — E119 Type 2 diabetes mellitus without complications: Secondary | ICD-10-CM | POA: Insufficient documentation

## 2022-07-25 DIAGNOSIS — I10 Essential (primary) hypertension: Secondary | ICD-10-CM | POA: Insufficient documentation

## 2022-07-25 NOTE — Discharge Instructions (Addendum)
Follow-up with your PCP, return to the ER if you feel like your symptoms are getting worse.  You need to see your primary care doctor, for further management of your chronic pain.

## 2022-07-25 NOTE — ED Triage Notes (Signed)
Patient here again for second time today for same c/o of pain since fall months ago.

## 2022-07-25 NOTE — ED Provider Notes (Signed)
[place] EMERGENCY DEPARTMENT AT [facility] Provider Note   CSN: [id number] Arrival date & time: 07/25/22  1213     History  Chief Complaint  Patient presents with   Neck Pain    [name] is a 77 y.o. female, history of osteoarthritis, cervical stenosis, who presents to the ED secondary to allover body pain, that is typical for her.  She has been seen in the ER 30+ times in the last 3 months.  She states her pain is not different than usual, but just wanted to be looked at.  She denies any kind of recent trauma, denies any headache, states that hurts she has been wearing her.  She declines ortho follow-up. Denies any new complaints.      Home Medications Prior to Admission medications   Medication Sig Start Date End Date Taking? Authorizing Provider  acetaminophen (TYLENOL) 500 MG tablet Take 1 tablet (500 mg total) by mouth every 6 (six) hours as needed. 06/28/22   Glyn Ade, MD  albuterol (VENTOLIN HFA) 108 (90 Base) MCG/ACT inhaler Inhale 1-2 puffs into the lungs every 6 (six) hours as needed for wheezing or shortness of breath. 04/22/20   Barbette Merino, NP  amLODipine (NORVASC) 10 MG tablet Take 1 tablet (10 mg total) by mouth daily. 05/31/22   Dione Booze, MD  chlorthalidone (HYGROTON) 25 MG tablet TAKE 1 TABLET (25 MG TOTAL) BY MOUTH DAILY. 11/20/21 11/20/22  Donell Beers, FNP  clopidogrel (PLAVIX) 75 MG tablet Take 1 tablet (75 mg total) by mouth daily. 06/15/21   Elige Radon, MD  diclofenac Sodium (VOLTAREN) 1 % GEL Apply 2 g topically 4 (four) times daily. 05/22/22   Karie Mainland, Amjad, PA-C  gabapentin (NEURONTIN) 300 MG capsule Take 1 capsule (300 mg total) by mouth 3 (three) times daily. 03/19/22 04/18/22  Alvira Monday, MD  lidocaine (LIDODERM) 5 % Place 1 patch onto the skin daily. Remove & Discard patch within 12 hours or as directed by MD 07/15/22   Linwood Dibbles, MD  losartan (COZAAR) 50 MG tablet Take 1 tablet (50 mg total) by mouth daily. 04/20/22  05/20/22  Roemhildt, Lorin T, PA-C  metFORMIN (GLUCOPHAGE) 500 MG tablet Take 1 tablet (500 mg total) by mouth 2 (two) times daily with a meal. 09/05/21 09/05/22  [name] A, DO  pantoprazole (PROTONIX) 20 MG tablet Take 1 tablet (20 mg total) by mouth daily. 10/10/21   [name], MD  rosuvastatin (CRESTOR) 10 MG tablet Take 1 tablet (10 mg total) by mouth at bedtime. 06/15/21 06/15/22  Elige Radon, MD  traMADol (ULTRAM) 50 MG tablet Take 1 tablet (50 mg total) by mouth every 6 (six) hours as needed. 05/31/22   Dione Booze, MD      Allergies    Codeine, Ibuprofen, and Imdur [isosorbide nitrate]    Review of Systems   Review of Systems  Musculoskeletal:  Positive for back pain and neck pain.    Physical Exam Updated Vital Signs BP 135/68   Pulse 63   Temp 98.2 F (36.8 C) (Oral)   Resp 18   Ht 5\' 4"  (1.626 m)   Wt 60.8 kg   SpO2 100%   BMI 23.00 kg/m  Physical Exam Vitals and nursing note reviewed.  Constitutional:      General: She is not in acute distress.    Appearance: She is well-developed.  HENT:     Head: Normocephalic and atraumatic.  Eyes:     Conjunctiva/sclera: Conjunctivae normal.  Neck:     Comments: +C-collar in place Cardiovascular:     Rate and Rhythm: Normal rate and regular rhythm.     Heart sounds: No murmur heard. Pulmonary:     Effort: Pulmonary effort is normal. No respiratory distress.     Breath sounds: Normal breath sounds.  Abdominal:     Palpations: Abdomen is soft.     Tenderness: There is no abdominal tenderness.  Musculoskeletal:        General: No swelling.  Skin:    General: Skin is warm and dry.     Capillary Refill: Capillary refill takes less than 2 seconds.  Neurological:     Mental Status: She is alert.  Psychiatric:        Mood and Affect: Mood normal.     ED Results / Procedures / Treatments   Labs (all labs ordered are listed, but only abnormal results are displayed) Labs Reviewed - No data to  display  EKG None  Radiology No results found.  Procedures Procedures    Medications Ordered in ED Medications - No data to display  ED Course/ Medical Decision Making/ A&P                             Medical Decision Making Patient is 77 year old female, here for her chronic pain.  She states is not different than usual, denies any recent trauma.  She is well-appearing able to move all extremities.  We discussed importance of follow-up with primary care doctor given chronic pain, for management.  She was offered Tylenol but refused.  Return precautions emphasized.  No new swelling, redness, different pain than usual.  No traumas recently.    Final Clinical Impression(s) / ED Diagnoses Final diagnoses:  Other chronic pain    Rx / DC Orders ED Discharge Orders     None         Cristen Murcia, [place], PA 07/25/22 1307    Gwyneth Sprout, MD 07/26/22 [phone]
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
## 2022-07-26 ENCOUNTER — Encounter (HOSPITAL_COMMUNITY): Payer: Self-pay

## 2022-07-26 DIAGNOSIS — G8929 Other chronic pain: Secondary | ICD-10-CM | POA: Diagnosis not present

## 2022-07-26 MED ORDER — CYCLOBENZAPRINE HCL 10 MG PO TABS
10.0000 mg | ORAL_TABLET | Freq: Once | ORAL | Status: AC
  Administered 2022-07-26: 10 mg via ORAL
  Filled 2022-07-26: qty 1

## 2022-07-26 NOTE — Discharge Instructions (Signed)
Recommend over-the-counter pain medication as needed.  Follow-up with community health and wellness for further evaluation.  Come back to the emergency department if you develop chest pain, shortness of breath, severe abdominal pain, uncontrolled nausea, vomiting, diarrhea.

## 2022-07-26 NOTE — ED Provider Notes (Signed)
[place] EMERGENCY DEPARTMENT AT [facility] Provider Note   CSN: [id number] Arrival date & time: 07/25/22  1750     History  Chief Complaint  Patient presents with   Pain    [name] is a 77 y.o. female.  HPI   Patient with medical history including diabetes, CVA, GERD, hypertension, chronic pain, presenting with complaints of bilateral hand pain, patient states that she has had this pain since she fell onto bus approximately 2 and half months ago, she denies any new trauma to the area, states that this has not gotten any better since the fall.  States that she has been taking Tylenol without much relief.  She has no other complaints.  Denies any paresthesias or weakness, no associate chest pain shortness of breath stomach pains nausea vomiting.  Reviewed patient's chart has been seen multiple times for similar presentations, was most recently seen yesterday and was discharged.    Home Medications Prior to Admission medications   Medication Sig Start Date End Date Taking? Authorizing Provider  acetaminophen (TYLENOL) 500 MG tablet Take 1 tablet (500 mg total) by mouth every 6 (six) hours as needed. 06/28/22   Glyn Ade, MD  albuterol (VENTOLIN HFA) 108 (90 Base) MCG/ACT inhaler Inhale 1-2 puffs into the lungs every 6 (six) hours as needed for wheezing or shortness of breath. 04/22/20   Barbette Merino, NP  amLODipine (NORVASC) 10 MG tablet Take 1 tablet (10 mg total) by mouth daily. 05/31/22   Dione Booze, MD  chlorthalidone (HYGROTON) 25 MG tablet TAKE 1 TABLET (25 MG TOTAL) BY MOUTH DAILY. 11/20/21 11/20/22  Donell Beers, FNP  clopidogrel (PLAVIX) 75 MG tablet Take 1 tablet (75 mg total) by mouth daily. 06/15/21   Elige Radon, MD  diclofenac Sodium (VOLTAREN) 1 % GEL Apply 2 g topically 4 (four) times daily. 05/22/22   Karie Mainland, Amjad, PA-C  gabapentin (NEURONTIN) 300 MG capsule Take 1 capsule (300 mg total) by mouth 3 (three) times daily. 03/19/22 04/18/22   Alvira Monday, MD  lidocaine (LIDODERM) 5 % Place 1 patch onto the skin daily. Remove & Discard patch within 12 hours or as directed by MD 07/15/22   Linwood Dibbles, MD  losartan (COZAAR) 50 MG tablet Take 1 tablet (50 mg total) by mouth daily. 04/20/22 05/20/22  Roemhildt, Lorin T, PA-C  metFORMIN (GLUCOPHAGE) 500 MG tablet Take 1 tablet (500 mg total) by mouth 2 (two) times daily with a meal. 09/05/21 09/05/22  [name] A, DO  pantoprazole (PROTONIX) 20 MG tablet Take 1 tablet (20 mg total) by mouth daily. 10/10/21   [name], MD  rosuvastatin (CRESTOR) 10 MG tablet Take 1 tablet (10 mg total) by mouth at bedtime. 06/15/21 06/15/22  Elige Radon, MD  traMADol (ULTRAM) 50 MG tablet Take 1 tablet (50 mg total) by mouth every 6 (six) hours as needed. 05/31/22   Dione Booze, MD      Allergies    Codeine, Ibuprofen, and Imdur [isosorbide nitrate]    Review of Systems   Review of Systems  Constitutional:  Negative for chills and fever.  Respiratory:  Negative for shortness of breath.   Cardiovascular:  Negative for chest pain.  Gastrointestinal:  Negative for abdominal pain.  Neurological:  Negative for headaches.    Physical Exam Updated Vital Signs BP (!) 151/58 (BP Location: Right Arm)   Pulse 61   Temp 98.3 F (36.8 C) (Oral)   Resp 15   Ht 5\' 4"  (1.626 m)  Wt 60.8 kg   SpO2 99%   BMI 23.01 kg/m  Physical Exam Vitals and nursing note reviewed.  Constitutional:      General: She is not in acute distress.    Appearance: She is not ill-appearing.     Comments: On examination patient was eating chicken fingers, [place] fries and having a soda.  HENT:     Head: Normocephalic and atraumatic.     Nose: No congestion.  Eyes:     Conjunctiva/sclera: Conjunctivae normal.  Cardiovascular:     Rate and Rhythm: Normal rate and regular rhythm.     Pulses: Normal pulses.  Pulmonary:     Effort: Pulmonary effort is normal.  Musculoskeletal:     Comments: Focused exam of the  upper extremities were benign, no unilateral swelling, no erythema or edema no gross deformities present, she has full range of motion 5-5 strength noted at the fingers wrist elbow and shoulders bilaterally, she has 2+ radial pulses, 2-second capillary refill, sensation tact to light touch.   Skin:    General: Skin is warm and dry.  Neurological:     Mental Status: She is alert.  Psychiatric:        Mood and Affect: Mood normal.     ED Results / Procedures / Treatments   Labs (all labs ordered are listed, but only abnormal results are displayed) Labs Reviewed - No data to display  EKG None  Radiology No results found.  Procedures Procedures    Medications Ordered in ED Medications  cyclobenzaprine (FLEXERIL) tablet 10 mg (10 mg Oral Given 07/26/22 0505)    ED Course/ Medical Decision Making/ A&P                             Medical Decision Making Risk Prescription drug management.   This patient presents to the ED for concern of hand pain, this involves an extensive number of treatment options, and is a complaint that carries with it a high risk of complications and morbidity.  The differential diagnosis includes fracture, dislocation, vascular injury, DVT, cellulitis    Additional history obtained:  Additional history obtained from N/A External records from outside source obtained and reviewed including recent ER notes   Co morbidities that complicate the patient evaluation  Diabetes, hypertension  Social Determinants of Health:  Geriatric    Lab Tests:  I Ordered, and personally interpreted labs.  The pertinent results include: N/A   Imaging Studies ordered:  I ordered imaging studies including N/A I independently visualized and interpreted imaging which showed N/A I agree with the radiologist interpretation   Cardiac Monitoring:  The patient was maintained on a cardiac monitor.  I personally viewed and interpreted the cardiac monitored which  showed an underlying rhythm of: N/A   Medicines ordered and prescription drug management:  I ordered medication including Flexeril I have reviewed the patients home medicines and have made adjustments as needed  Critical Interventions:  N/A   Reevaluation:  Presents with bilateral hand pain, she had benign physical exam, will provide her with a muscle relaxer and reassess  Reassessment resting comfortably vital signs reassuring, patient agreement discharge at this time.  Consultations Obtained:  N/a    Test Considered:  X-ray of the hands-defer this patient has been x-rayed multiple times without acute abnormalities, there is no associated traumatic injury with this pain, there is no gross deformities present, my suspicion for fracture is very low at this  time.    Rule out I have low suspicion for septic arthritis as patient denies IV drug use, skin exam was performed no erythematous, edematous, warm joints noted on exam, no new heart murmur heard on exam.  Doubt vascular injury as she is neurovascular intact on my exam.  I doubt DVT she has low risk factors, there is no unilateral swelling, presentation atypical of etiology..  Low suspicion for compartment syndrome as area was palpated it was soft to the touch, neurovascular fully intact.     Dispostion and problem list  After consideration of the diagnostic results and the patients response to treatment, I feel that the patent would benefit from discharge.    Chronic pain-will have her continue with over-the-counter pain medications, follow-up with PCP for further assessment strict return precautions.             Final Clinical Impression(s) / ED Diagnoses Final diagnoses:  Other chronic pain    Rx / DC Orders ED Discharge Orders     None         Carroll Sage, PA-C 07/26/22 1610    Shon Baton, MD 07/27/22 0230

## 2022-07-28 ENCOUNTER — Other Ambulatory Visit: Payer: Self-pay

## 2022-07-28 ENCOUNTER — Encounter (HOSPITAL_COMMUNITY): Payer: Self-pay | Admitting: Emergency Medicine

## 2022-07-28 ENCOUNTER — Emergency Department (HOSPITAL_COMMUNITY)
Admission: EM | Admit: 2022-07-28 | Discharge: 2022-07-28 | Disposition: A | Discharge status: Home / Self Care | Payer: 59 | Attending: Emergency Medicine | Admitting: Emergency Medicine

## 2022-07-28 DIAGNOSIS — Z7984 Long term (current) use of oral hypoglycemic drugs: Secondary | ICD-10-CM | POA: Insufficient documentation

## 2022-07-28 DIAGNOSIS — Z7901 Long term (current) use of anticoagulants: Secondary | ICD-10-CM | POA: Diagnosis not present

## 2022-07-28 DIAGNOSIS — I10 Essential (primary) hypertension: Secondary | ICD-10-CM | POA: Insufficient documentation

## 2022-07-28 DIAGNOSIS — M79642 Pain in left hand: Secondary | ICD-10-CM | POA: Insufficient documentation

## 2022-07-28 DIAGNOSIS — Z79899 Other long term (current) drug therapy: Secondary | ICD-10-CM | POA: Insufficient documentation

## 2022-07-28 DIAGNOSIS — M79641 Pain in right hand: Secondary | ICD-10-CM | POA: Diagnosis not present

## 2022-07-28 DIAGNOSIS — E119 Type 2 diabetes mellitus without complications: Secondary | ICD-10-CM | POA: Insufficient documentation

## 2022-07-28 MED ORDER — DICLOFENAC SODIUM 1 % EX GEL: 2.0000 g | Freq: Four times a day (QID) | CUTANEOUS | 0 refills | Status: DC

## 2022-07-28 NOTE — ED Triage Notes (Signed)
Pt has pain in her hand since fall months ago on a [place]"

## 2022-07-28 NOTE — ED Provider Notes (Signed)
[place] EMERGENCY DEPARTMENT AT [facility] Provider Note   CSN: [id number] Arrival date & time: 07/28/22  1520     History  Chief Complaint  Patient presents with   Hand Pain    [name] is a 77 y.o. female.  With a history of diabetes, hypertension, GERD who presents to the ED for evaluation of bilateral hand pain.  She has had this pain since she fell on the bus approximately 2.5 months ago.  She has not been taking anything for her pain as she states that Tylenol does not work.  She has not followed up with a primary care provider or orthopedic provider.  She denies any numbness, weakness or tingling.   Hand Pain       Home Medications Prior to Admission medications   Medication Sig Start Date End Date Taking? Authorizing Provider  diclofenac Sodium (VOLTAREN) 1 % GEL Apply 2 g topically 4 (four) times daily. 07/28/22  Yes Pierra Skora, Edsel Petrin, PA-C  acetaminophen (TYLENOL) 500 MG tablet Take 1 tablet (500 mg total) by mouth every 6 (six) hours as needed. 06/28/22   Glyn Ade, MD  albuterol (VENTOLIN HFA) 108 (90 Base) MCG/ACT inhaler Inhale 1-2 puffs into the lungs every 6 (six) hours as needed for wheezing or shortness of breath. 04/22/20   Barbette Merino, NP  amLODipine (NORVASC) 10 MG tablet Take 1 tablet (10 mg total) by mouth daily. 05/31/22   Dione Booze, MD  chlorthalidone (HYGROTON) 25 MG tablet TAKE 1 TABLET (25 MG TOTAL) BY MOUTH DAILY. 11/20/21 11/20/22  Donell Beers, FNP  clopidogrel (PLAVIX) 75 MG tablet Take 1 tablet (75 mg total) by mouth daily. 06/15/21   Elige Radon, MD  diclofenac Sodium (VOLTAREN) 1 % GEL Apply 2 g topically 4 (four) times daily. 05/22/22   Karie Mainland, Amjad, PA-C  gabapentin (NEURONTIN) 300 MG capsule Take 1 capsule (300 mg total) by mouth 3 (three) times daily. 03/19/22 04/18/22  Alvira Monday, MD  lidocaine (LIDODERM) 5 % Place 1 patch onto the skin daily. Remove & Discard patch within 12 hours or as directed by MD  07/15/22   Linwood Dibbles, MD  losartan (COZAAR) 50 MG tablet Take 1 tablet (50 mg total) by mouth daily. 04/20/22 05/20/22  Roemhildt, Lorin T, PA-C  metFORMIN (GLUCOPHAGE) 500 MG tablet Take 1 tablet (500 mg total) by mouth 2 (two) times daily with a meal. 09/05/21 09/05/22  [name] A, DO  pantoprazole (PROTONIX) 20 MG tablet Take 1 tablet (20 mg total) by mouth daily. 10/10/21   [name], MD  rosuvastatin (CRESTOR) 10 MG tablet Take 1 tablet (10 mg total) by mouth at bedtime. 06/15/21 06/15/22  Elige Radon, MD  traMADol (ULTRAM) 50 MG tablet Take 1 tablet (50 mg total) by mouth every 6 (six) hours as needed. 05/31/22   Dione Booze, MD      Allergies    Codeine, Ibuprofen, and Imdur [isosorbide nitrate]    Review of Systems   Review of Systems  Musculoskeletal:  Positive for arthralgias.  All other systems reviewed and are negative.   Physical Exam Updated Vital Signs BP 113/71   Pulse 66   Temp 97.9 F (36.6 C)   Resp 16   SpO2 99%  Physical Exam Vitals and nursing note reviewed.  Constitutional:      General: She is not in acute distress.    Appearance: Normal appearance. She is normal weight. She is not ill-appearing.  HENT:  Head: Normocephalic and atraumatic.  Neck:     Comments: C-collar in place Pulmonary:     Effort: Pulmonary effort is normal. No respiratory distress.  Abdominal:     General: Abdomen is flat.  Musculoskeletal:        General: Normal range of motion.     Cervical back: Neck supple.     Comments: Grip strength 5 out of 5 in bilateral upper extremities.  Sensation intact in all digits.  She is able to flex and extend all fingers and bilateral wrists.  Radial pulses 2+ bilaterally.  Capillary refill normal.  Mild amount of swelling  Skin:    General: Skin is warm and dry.  Neurological:     Mental Status: She is alert and oriented to person, place, and time.  Psychiatric:        Mood and Affect: Mood normal.        Behavior: Behavior  normal.     ED Results / Procedures / Treatments   Labs (all labs ordered are listed, but only abnormal results are displayed) Labs Reviewed - No data to display  EKG None  Radiology No results found.  Procedures Procedures    Medications Ordered in ED Medications - No data to display  ED Course/ Medical Decision Making/ A&P                             Medical Decision Making  This patient presents to the ED for concern of bilateral hand pain, this involves an extensive number of treatment options, and is a complaint that carries with it a high risk of complications and morbidity.  The differential diagnosis includes fracture, strain, sprain, contusion, dislocation, arthritis  Additional history obtained from: Nursing notes from this visit. 58 visits in the past 6 months, most of them for similar complaints  Afebrile, hemodynamically stable.  77 year old female presenting to the ED for evaluation of bilateral hand pain.  She has had numerous ED visits for similar over the past 6 months.  She states Tylenol does not help.  She is declining Tylenol in the ED.  She was unable to pick up any previous medications due to cost issues.  She is agreeable to trying Voltaren gel.  This was sent to her pharmacy.  Her neurovascular status is intact.  No new trauma.  This appears to be chronic issue.  She was strongly encouraged to follow-up with her PCP regarding her pain.  She was given return precautions.  Stable at discharge.  At this time there does not appear to be any evidence of an acute emergency medical condition and the patient appears stable for discharge with appropriate outpatient follow up. Diagnosis was discussed with patient who verbalizes understanding of care plan and is agreeable to discharge. I have discussed return precautions with patient who verbalizes understanding. Patient encouraged to follow-up with their PCP within 1 week. All questions answered.  Note: Portions  of this report may have been transcribed using voice recognition software. Every effort was made to ensure accuracy; however, inadvertent computerized transcription errors may still be present.        Final Clinical Impression(s) / ED Diagnoses Final diagnoses:  Pain in both hands    Rx / DC Orders ED Discharge Orders          Ordered    diclofenac Sodium (VOLTAREN) 1 % GEL  4 times daily        07/28/22  [address]              Michelle Piper, PA-C 07/28/22 1607    Gloris Manchester, MD 07/29/22 1945

## 2022-07-28 NOTE — Discharge Instructions (Addendum)
You have been seen today for your complaint of hand pain. Your discharge medications include Voltaren gel.  This is a pain medicine. Follow up with: A primary care provider of your choice within the next week Please seek immediate medical care if you develop any of the following symptoms: Your hand is extremely swollen or is an unusual shape. Your hand or fingers turn white or blue. You cannot move your hand, wrist, or fingers. At this time there does not appear to be the presence of an emergent medical condition, however there is always the potential for conditions to change. Please read and follow the below instructions.  Do not take your medicine if  develop an itchy rash, swelling in your mouth or lips, or difficulty breathing; call 911 and seek immediate emergency medical attention if this occurs.  You may review your lab tests and imaging results in their entirety on your MyChart account.  Please discuss all results of fully with your primary care provider and other specialist at your follow-up visit.  Note: Portions of this text may have been transcribed using voice recognition software. Every effort was made to ensure accuracy; however, inadvertent computerized transcription errors may still be present.

## 2022-07-30 ENCOUNTER — Emergency Department (HOSPITAL_COMMUNITY)
Admission: EM | Admit: 2022-07-30 | Discharge: 2022-07-30 | Disposition: A | Discharge status: Home / Self Care | Payer: 59 | Attending: Emergency Medicine | Admitting: Emergency Medicine

## 2022-07-30 ENCOUNTER — Encounter (HOSPITAL_COMMUNITY): Payer: Self-pay | Admitting: Emergency Medicine

## 2022-07-30 ENCOUNTER — Emergency Department (HOSPITAL_COMMUNITY): Payer: 59

## 2022-07-30 DIAGNOSIS — E119 Type 2 diabetes mellitus without complications: Secondary | ICD-10-CM | POA: Diagnosis not present

## 2022-07-30 DIAGNOSIS — I10 Essential (primary) hypertension: Secondary | ICD-10-CM | POA: Insufficient documentation

## 2022-07-30 DIAGNOSIS — R519 Headache, unspecified: Secondary | ICD-10-CM | POA: Diagnosis not present

## 2022-07-30 DIAGNOSIS — M542 Cervicalgia: Secondary | ICD-10-CM | POA: Insufficient documentation

## 2022-07-30 DIAGNOSIS — Z79899 Other long term (current) drug therapy: Secondary | ICD-10-CM | POA: Insufficient documentation

## 2022-07-30 DIAGNOSIS — Z8673 Personal history of transient ischemic attack (TIA), and cerebral infarction without residual deficits: Secondary | ICD-10-CM | POA: Insufficient documentation

## 2022-07-30 MED ORDER — METHOCARBAMOL 500 MG PO TABS
500.0000 mg | ORAL_TABLET | Freq: Once | ORAL | Status: AC
  Administered 2022-07-30: 500 mg via ORAL
  Filled 2022-07-30: qty 1
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
## 2022-07-30 NOTE — Discharge Instructions (Signed)
You have been seen today for your complaint of neck pain. Your imaging was reassuring. Your discharge medications include your home medications. Please seek immediate medical care if you develop any of the following symptoms: Your pain suddenly gets much worse. You develop chest pain. You have trouble breathing or shortness of breath. You faint, or another person sees you faint. At this time there does not appear to be the presence of an emergent medical condition, however there is always the potential for conditions to change. Please read and follow the below instructions.  Do not take your medicine if  develop an itchy rash, swelling in your mouth or lips, or difficulty breathing; call 911 and seek immediate emergency medical attention if this occurs.  You may review your lab tests and imaging results in their entirety on your MyChart account.  Please discuss all results of fully with your primary care provider and other specialist at your follow-up visit.  Note: Portions of this text may have been transcribed using voice recognition software. Every effort was made to ensure accuracy; however, inadvertent computerized transcription errors may still be present.
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
## 2022-08-01 ENCOUNTER — Encounter (HOSPITAL_COMMUNITY): Payer: Self-pay

## 2022-08-01 ENCOUNTER — Other Ambulatory Visit: Payer: Self-pay

## 2022-08-01 ENCOUNTER — Emergency Department (HOSPITAL_COMMUNITY)
Admission: EM | Admit: 2022-08-01 | Discharge: 2022-08-01 | Disposition: A | Discharge status: Home / Self Care | Payer: 59 | Attending: Emergency Medicine | Admitting: Emergency Medicine

## 2022-08-01 DIAGNOSIS — M542 Cervicalgia: Secondary | ICD-10-CM | POA: Insufficient documentation

## 2022-08-01 DIAGNOSIS — I10 Essential (primary) hypertension: Secondary | ICD-10-CM | POA: Insufficient documentation

## 2022-08-01 DIAGNOSIS — R519 Headache, unspecified: Secondary | ICD-10-CM | POA: Insufficient documentation

## 2022-08-01 DIAGNOSIS — E119 Type 2 diabetes mellitus without complications: Secondary | ICD-10-CM | POA: Insufficient documentation

## 2022-08-01 DIAGNOSIS — M79605 Pain in left leg: Secondary | ICD-10-CM | POA: Diagnosis not present

## 2022-08-01 DIAGNOSIS — R079 Chest pain, unspecified: Secondary | ICD-10-CM | POA: Insufficient documentation

## 2022-08-01 DIAGNOSIS — M79621 Pain in right upper arm: Secondary | ICD-10-CM | POA: Diagnosis not present

## 2022-08-01 DIAGNOSIS — M79604 Pain in right leg: Secondary | ICD-10-CM | POA: Diagnosis not present

## 2022-08-01 DIAGNOSIS — Z7984 Long term (current) use of oral hypoglycemic drugs: Secondary | ICD-10-CM | POA: Diagnosis not present

## 2022-08-01 DIAGNOSIS — Z79899 Other long term (current) drug therapy: Secondary | ICD-10-CM | POA: Insufficient documentation

## 2022-08-01 DIAGNOSIS — M791 Myalgia, unspecified site: Secondary | ICD-10-CM | POA: Insufficient documentation

## 2022-08-01 DIAGNOSIS — Z7901 Long term (current) use of anticoagulants: Secondary | ICD-10-CM | POA: Insufficient documentation

## 2022-08-01 DIAGNOSIS — M79622 Pain in left upper arm: Secondary | ICD-10-CM | POA: Diagnosis not present

## 2022-08-01 DIAGNOSIS — R52 Pain, unspecified: Secondary | ICD-10-CM

## 2022-08-01 MED ORDER — ACETAMINOPHEN 325 MG PO TABS
650.0000 mg | ORAL_TABLET | Freq: Once | ORAL | Status: AC
  Administered 2022-08-01: 650 mg via ORAL
  Filled 2022-08-01: qty 2

## 2022-08-01 MED ORDER — ACETAMINOPHEN 325 MG PO TABS
ORAL_TABLET | ORAL | Status: AC
  Filled 2022-08-01: qty 1

## 2022-08-01 NOTE — Discharge Instructions (Addendum)
Evaluation for your generalized body aches were reassuring.  Recommend you follow-up with your PCP if your symptoms persist.

## 2022-08-01 NOTE — ED Notes (Signed)
Discharge instructions discussed with pt. Verbalized understanding. VSS. No questions or concerns regarding discharge  
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
## 2022-08-04 ENCOUNTER — Emergency Department (HOSPITAL_COMMUNITY)
Admission: EM | Admit: 2022-08-04 | Discharge: 2022-08-04 | Disposition: A | Discharge status: Home / Self Care | Payer: 59 | Source: Home / Self Care | Attending: Emergency Medicine | Admitting: Emergency Medicine

## 2022-08-04 ENCOUNTER — Emergency Department (HOSPITAL_COMMUNITY)
Admission: EM | Admit: 2022-08-04 | Discharge: 2022-08-04 | Disposition: A | Discharge status: Home / Self Care | Payer: 59 | Attending: Emergency Medicine | Admitting: Emergency Medicine

## 2022-08-04 ENCOUNTER — Other Ambulatory Visit: Payer: Self-pay

## 2022-08-04 ENCOUNTER — Encounter (HOSPITAL_COMMUNITY): Payer: Self-pay | Admitting: *Deleted

## 2022-08-04 ENCOUNTER — Encounter (HOSPITAL_COMMUNITY): Payer: Self-pay

## 2022-08-04 DIAGNOSIS — M79641 Pain in right hand: Secondary | ICD-10-CM | POA: Insufficient documentation

## 2022-08-04 DIAGNOSIS — I10 Essential (primary) hypertension: Secondary | ICD-10-CM | POA: Insufficient documentation

## 2022-08-04 DIAGNOSIS — E119 Type 2 diabetes mellitus without complications: Secondary | ICD-10-CM | POA: Insufficient documentation

## 2022-08-04 DIAGNOSIS — M79642 Pain in left hand: Secondary | ICD-10-CM | POA: Insufficient documentation

## 2022-08-04 MED ORDER — ACETAMINOPHEN 325 MG PO TABS
650.0000 mg | ORAL_TABLET | Freq: Once | ORAL | Status: AC
  Administered 2022-08-04: 650 mg via ORAL
  Filled 2022-08-04: qty 2

## 2022-08-04 MED ORDER — METHOCARBAMOL 500 MG PO TABS
500.0000 mg | ORAL_TABLET | Freq: Once | ORAL | Status: AC
  Administered 2022-08-04: 500 mg via ORAL
  Filled 2022-08-04: qty 1
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
## 2022-08-04 NOTE — Discharge Instructions (Signed)
You were seen in the emergency department for pain. You were given a dose of Robaxin for your pain. Please return to the ER if symptoms are worsening. Manage pain at home with OTC pain medicine like Tylenol, Aleve, or Advil.

## 2022-08-04 NOTE — Discharge Instructions (Signed)
You are seen today for hand pain.  While you are here we performed a physical exam, offered you Tylenol, and monitor vital signs.  All of these were reassuring and there is no indication that he needs to do any further testing or workup in the emergency department.  Please establish with a primary care doctor to address your chronic problems.  Come back to the emergency department if you have any new or worsening concerns.
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
## 2022-08-06 ENCOUNTER — Encounter (HOSPITAL_COMMUNITY): Payer: Self-pay

## 2022-08-06 ENCOUNTER — Other Ambulatory Visit: Payer: Self-pay

## 2022-08-06 ENCOUNTER — Emergency Department (HOSPITAL_COMMUNITY)
Admission: EM | Admit: 2022-08-06 | Discharge: 2022-08-07 | Disposition: A | Discharge status: Home / Self Care | Payer: 59 | Attending: Emergency Medicine | Admitting: Emergency Medicine

## 2022-08-06 DIAGNOSIS — I10 Essential (primary) hypertension: Secondary | ICD-10-CM | POA: Insufficient documentation

## 2022-08-06 DIAGNOSIS — G894 Chronic pain syndrome: Secondary | ICD-10-CM | POA: Diagnosis not present

## 2022-08-06 DIAGNOSIS — E119 Type 2 diabetes mellitus without complications: Secondary | ICD-10-CM | POA: Diagnosis not present

## 2022-08-06 DIAGNOSIS — Z79899 Other long term (current) drug therapy: Secondary | ICD-10-CM | POA: Diagnosis not present

## 2022-08-06 DIAGNOSIS — M791 Myalgia, unspecified site: Secondary | ICD-10-CM | POA: Diagnosis present

## 2022-08-06 DIAGNOSIS — Z7984 Long term (current) use of oral hypoglycemic drugs: Secondary | ICD-10-CM | POA: Insufficient documentation

## 2022-08-06 NOTE — ED Triage Notes (Signed)
Pt states that she fell months ago and still just hurts all over from the fall. 10/10 on pain scale everywhere

## 2022-08-07 DIAGNOSIS — G894 Chronic pain syndrome: Secondary | ICD-10-CM | POA: Diagnosis not present

## 2022-08-07 MED ORDER — NAPROXEN 250 MG PO TABS
500.0000 mg | ORAL_TABLET | Freq: Once | ORAL | Status: AC
  Administered 2022-08-07: 500 mg via ORAL
  Filled 2022-08-07: qty 2

## 2022-08-07 NOTE — Discharge Instructions (Addendum)
Use the Voltaren gel you are previously prescribed.  You are also given a prescription for Robaxin which you can try for management of pain and muscle spasms.  Follow-up with neurosurgery.  A referral was provided to you today.  Call the office to make an appointment.  You may return for new or concerning symptoms.
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
## 2022-08-08 ENCOUNTER — Encounter (HOSPITAL_COMMUNITY): Payer: Self-pay

## 2022-08-08 ENCOUNTER — Emergency Department (HOSPITAL_COMMUNITY)
Admission: EM | Admit: 2022-08-08 | Discharge: 2022-08-09 | Disposition: A | Discharge status: Home / Self Care | Payer: 59 | Attending: Emergency Medicine | Admitting: Emergency Medicine

## 2022-08-08 ENCOUNTER — Other Ambulatory Visit: Payer: Self-pay

## 2022-08-08 DIAGNOSIS — I10 Essential (primary) hypertension: Secondary | ICD-10-CM | POA: Insufficient documentation

## 2022-08-08 DIAGNOSIS — M79641 Pain in right hand: Secondary | ICD-10-CM | POA: Insufficient documentation

## 2022-08-08 DIAGNOSIS — Z79899 Other long term (current) drug therapy: Secondary | ICD-10-CM | POA: Diagnosis not present

## 2022-08-08 DIAGNOSIS — Z7984 Long term (current) use of oral hypoglycemic drugs: Secondary | ICD-10-CM | POA: Insufficient documentation

## 2022-08-08 DIAGNOSIS — M79642 Pain in left hand: Secondary | ICD-10-CM | POA: Insufficient documentation

## 2022-08-08 DIAGNOSIS — E119 Type 2 diabetes mellitus without complications: Secondary | ICD-10-CM | POA: Diagnosis not present

## 2022-08-08 NOTE — ED Triage Notes (Addendum)
Says her hands and everywhere is hurting. Denies any injury.   Wearing c-collar from previous visit.

## 2022-08-09 DIAGNOSIS — M79641 Pain in right hand: Secondary | ICD-10-CM | POA: Diagnosis not present

## 2022-08-09 MED ORDER — CYCLOBENZAPRINE HCL 10 MG PO TABS: 10.0000 mg | ORAL_TABLET | Freq: Two times a day (BID) | ORAL | 0 refills | Status: DC | PRN

## 2022-08-09 MED ORDER — CYCLOBENZAPRINE HCL 10 MG PO TABS
5.0000 mg | ORAL_TABLET | Freq: Once | ORAL | Status: AC
  Administered 2022-08-09: 5 mg via ORAL
  Filled 2022-08-09: qty 1

## 2022-08-09 NOTE — Discharge Instructions (Signed)
Recommend over-the-counter pain medication as needed,  I have also given you a prescription for a muscle relaxer this can make you drowsy do not consume alcohol or operate heavy machinery when taking this medication.   Follow-up with community health and wellness  Come back to the emergency department if you develop chest pain, shortness of breath, severe abdominal pain, uncontrolled nausea, vomiting, diarrhea.
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
## 2022-08-12 ENCOUNTER — Encounter (HOSPITAL_COMMUNITY): Payer: Self-pay

## 2022-08-12 ENCOUNTER — Other Ambulatory Visit: Payer: Self-pay

## 2022-08-12 ENCOUNTER — Emergency Department (HOSPITAL_COMMUNITY)
Admission: EM | Admit: 2022-08-12 | Discharge: 2022-08-12 | Disposition: A | Discharge status: Home / Self Care | Payer: 59 | Attending: Student | Admitting: Student

## 2022-08-12 DIAGNOSIS — Z8673 Personal history of transient ischemic attack (TIA), and cerebral infarction without residual deficits: Secondary | ICD-10-CM | POA: Diagnosis not present

## 2022-08-12 DIAGNOSIS — Z7984 Long term (current) use of oral hypoglycemic drugs: Secondary | ICD-10-CM | POA: Diagnosis not present

## 2022-08-12 DIAGNOSIS — Z79899 Other long term (current) drug therapy: Secondary | ICD-10-CM | POA: Diagnosis not present

## 2022-08-12 DIAGNOSIS — M2550 Pain in unspecified joint: Secondary | ICD-10-CM

## 2022-08-12 DIAGNOSIS — E119 Type 2 diabetes mellitus without complications: Secondary | ICD-10-CM | POA: Insufficient documentation

## 2022-08-12 DIAGNOSIS — I10 Essential (primary) hypertension: Secondary | ICD-10-CM | POA: Diagnosis not present

## 2022-08-12 DIAGNOSIS — M791 Myalgia, unspecified site: Secondary | ICD-10-CM | POA: Diagnosis present

## 2022-08-12 DIAGNOSIS — Z7902 Long term (current) use of antithrombotics/antiplatelets: Secondary | ICD-10-CM | POA: Insufficient documentation

## 2022-08-12 MED ORDER — GABAPENTIN 600 MG PO TABS: 300.0000 mg | ORAL_TABLET | Freq: Three times a day (TID) | ORAL | 0 refills | Status: DC

## 2022-08-12 MED ORDER — GABAPENTIN 300 MG PO CAPS
300.0000 mg | ORAL_CAPSULE | Freq: Once | ORAL | Status: AC
  Administered 2022-08-12: 300 mg via ORAL
  Filled 2022-08-12: qty 1

## 2022-08-12 NOTE — ED Provider Notes (Signed)
[place] EMERGENCY DEPARTMENT AT [facility] Provider Note  CSN: [id number] Arrival date & time: 08/12/22 1515  Chief Complaint(s) general pain  HPI [name] is a 77 y.o. female who presents emerged part for evaluation of generalized muscle and joint pain.  Patient has extensive ER presentation history as this is her 10th ER visits this month alone.  Similar complaints on presentation with generalized joint pain, hand pain, neck pain and she wears a [place] [employer] c-collar for comfort.  Denies any new fall or trauma.  Denies chest pain, shortness of breath, Donnell pain, nausea, vomiting or other systemic symptoms.   Past Medical History Past Medical History:  Diagnosis Date   Diabetes mellitus without complication (HCC)    GERD 09/04/2006   Qualifier: Diagnosis of  By: Duke Salvia     History of echocardiogram    a. 2D ECHO: 11/06/2013 EF 65%; no WMA. Mild TR. PA pk pressure 43 mm HG   Hypertension    Stroke [facility])    Patient Active Problem List   Diagnosis Date Noted   Cognitive impairment 06/16/2021   Chronic headache 06/16/2021   Healthcare maintenance 06/16/2021   Frequent patient in emergency department 06/16/2021   Cervical stenosis of spine 11/10/2019   Type 2 diabetes mellitus with hyperlipidemia (HCC) 06/20/2012   History of stroke 06/20/2012   Essential hypertension 09/04/2006   Osteoarthritis 09/04/2006   Home Medication(s) Prior to Admission medications   Medication Sig Start Date End Date Taking? Authorizing Provider  acetaminophen (TYLENOL) 500 MG tablet Take 1 tablet (500 mg total) by mouth every 6 (six) hours as needed. 06/28/22   Glyn Ade, MD  albuterol (VENTOLIN HFA) 108 (90 Base) MCG/ACT inhaler Inhale 1-2 puffs into the lungs every 6 (six) hours as needed for wheezing or shortness of breath. 04/22/20   Barbette Merino, NP  amLODipine (NORVASC) 10 MG tablet Take 1 tablet (10 mg total) by mouth daily. 05/31/22   Dione Booze, MD  chlorthalidone  (HYGROTON) 25 MG tablet TAKE 1 TABLET (25 MG TOTAL) BY MOUTH DAILY. 11/20/21 11/20/22  Donell Beers, FNP  clopidogrel (PLAVIX) 75 MG tablet Take 1 tablet (75 mg total) by mouth daily. 06/15/21   Elige Radon, MD  cyclobenzaprine (FLEXERIL) 10 MG tablet Take 1 tablet (10 mg total) by mouth 2 (two) times daily as needed for muscle spasms. 08/09/22   Carroll Sage, PA-C  diclofenac Sodium (VOLTAREN) 1 % GEL Apply 2 g topically 4 (four) times daily. 05/22/22   [name], PA-C  diclofenac Sodium (VOLTAREN) 1 % GEL Apply 2 g topically 4 (four) times daily. 07/28/22   Schutt, Edsel Petrin, PA-C  gabapentin (NEURONTIN) 300 MG capsule Take 1 capsule (300 mg total) by mouth 3 (three) times daily. 03/19/22 04/18/22  Alvira Monday, MD  lidocaine (LIDODERM) 5 % Place 1 patch onto the skin daily. Remove & Discard patch within 12 hours or as directed by MD 07/15/22   Linwood Dibbles, MD  losartan (COZAAR) 50 MG tablet Take 1 tablet (50 mg total) by mouth daily. 04/20/22 05/20/22  Roemhildt, Lorin T, PA-C  metFORMIN (GLUCOPHAGE) 500 MG tablet Take 1 tablet (500 mg total) by mouth 2 (two) times daily with a meal. 09/05/21 09/05/22  [name] A, DO  pantoprazole (PROTONIX) 20 MG tablet Take 1 tablet (20 mg total) by mouth daily. 10/10/21   [name], MD  rosuvastatin (CRESTOR) 10 MG tablet Take 1 tablet (10 mg total) by mouth at bedtime. 06/15/21 06/15/22  Christian, Rylee, MD  traMADol (ULTRAM) 50 MG tablet Take 1 tablet (50 mg total) by mouth every 6 (six) hours as needed. 05/31/22   Dione Booze, MD                                                                                                                                    Past Surgical History Past Surgical History:  Procedure Laterality Date   ABDOMINAL HYSTERECTOMY     ANTERIOR CERVICAL DECOMPRESSION/DISCECTOMY FUSION 4 LEVELS N/A 11/11/2019   Procedure: Cervical three-four Cervical four-five Cervical five-six Cervical six-seven  Anterior cervical  decompression/discectomy/fusion;  Surgeon: Maeola Harman, MD;  Location: [facility] OR;  Service: Neurosurgery;  Laterality: N/A;   LEFT HEART CATHETERIZATION WITH CORONARY ANGIOGRAM N/A 11/05/2013   Procedure: LEFT HEART CATHETERIZATION WITH CORONARY ANGIOGRAM;  Surgeon: Lennette Bihari, MD;  Location: [facility] CATH LAB;  Service: Cardiovascular;  Laterality: N/A;   Family History History reviewed. No pertinent family history.  Social History Social History   Tobacco Use   Smoking status: Never   Smokeless tobacco: Never  Vaping Use   Vaping status: Never Used  Substance Use Topics   Alcohol use: No   Drug use: No   Allergies Codeine, Ibuprofen, and Imdur [isosorbide nitrate]  Review of Systems Review of Systems  Musculoskeletal:  Positive for arthralgias and myalgias.    Physical Exam Vital Signs  I have reviewed the triage vital signs BP (!) 157/72 (BP Location: Right Arm)   Pulse 65   Temp 98.2 F (36.8 C) (Oral)   Resp 14   SpO2 99%   Physical Exam Vitals and nursing note reviewed.  Constitutional:      General: She is not in acute distress.    Appearance: She is well-developed.  HENT:     Head: Normocephalic and atraumatic.  Eyes:     Conjunctiva/sclera: Conjunctivae normal.  Cardiovascular:     Rate and Rhythm: Normal rate and regular rhythm.     Heart sounds: No murmur heard. Pulmonary:     Effort: Pulmonary effort is normal. No respiratory distress.     Breath sounds: Normal breath sounds.  Abdominal:     Palpations: Abdomen is soft.     Tenderness: There is no abdominal tenderness.  Musculoskeletal:        General: No swelling.     Cervical back: Neck supple.  Skin:    General: Skin is warm and dry.     Capillary Refill: Capillary refill takes less than 2 seconds.  Neurological:     Mental Status: She is alert.  Psychiatric:        Mood and Affect: Mood normal.     ED Results and Treatments Labs (all labs ordered are listed, but only abnormal results  are displayed) Labs Reviewed - No data to display  Radiology No results found.  Pertinent labs & imaging results that were available during my care of the patient were reviewed by me and considered in my medical decision making (see MDM for details).  Medications Ordered in ED Medications  gabapentin (NEURONTIN) capsule 300 mg (300 mg Oral Given 08/12/22 1703)                                                                                                                                     Procedures Procedures  (including critical care time)  Medical Decision Making / ED Course   This patient presents to the ED for concern of myalgias, arthralgias, this involves an extensive number of treatment options, and is a complaint that carries with it a high risk of complications and morbidity.  The differential diagnosis includes arthritis, contusion, hematoma, rheumatoid, secondary gain  MDM: Patient seen emergency room for evaluation of generalized muscle and joint pain.  Physical exam is largely unremarkable.  Patient given her prescribed gabapentin leading to resolution of her symptoms.  She is requesting a repeat prescription for this medication of which I printed and gave to her at bedside.  Low suspicion for any new acute pathology given no recent fall or trauma to multiple joints.  Patient is hemodynamically stable here in the ER.  At this time she does not meet inpatient criteria for admission and she is safe for discharge with outpatient follow-up.   Additional history obtained:  -External records from outside source obtained and reviewed including: Chart review including previous notes, labs, imaging, consultation notes   Medicines ordered and prescription drug management: Meds ordered this encounter  Medications   gabapentin (NEURONTIN) capsule 300 mg    -I  have reviewed the patients home medicines and have made adjustments as needed  Critical interventions none   Social Determinants of Health:  Factors impacting patients care include: Frequent ER visits   Reevaluation: After the interventions noted above, I reevaluated the patient and found that they have :improved  Co morbidities that complicate the patient evaluation  Past Medical History:  Diagnosis Date   Diabetes mellitus without complication (HCC)    GERD 09/04/2006   Qualifier: Diagnosis of  By: Duke Salvia     History of echocardiogram    a. 2D ECHO: 11/06/2013 EF 65%; no WMA. Mild TR. PA pk pressure 43 mm HG   Hypertension    Stroke [facility])       Dispostion: I considered admission for this patient, but at this time she does not meet inpatient criteria for admission she is safe for discharge with outpatient follow-up     Final Clinical Impression(s) / ED Diagnoses Final diagnoses:  None     @PCDICTATION @    Glendora Score, MD 08/12/22 1711
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
## 2022-08-19 ENCOUNTER — Emergency Department (HOSPITAL_COMMUNITY)
Admission: EM | Admit: 2022-08-19 | Discharge: 2022-08-19 | Disposition: A | Discharge status: Home / Self Care | Payer: 59 | Attending: Emergency Medicine | Admitting: Emergency Medicine

## 2022-08-19 ENCOUNTER — Other Ambulatory Visit: Payer: Self-pay

## 2022-08-19 DIAGNOSIS — I10 Essential (primary) hypertension: Secondary | ICD-10-CM | POA: Insufficient documentation

## 2022-08-19 DIAGNOSIS — M542 Cervicalgia: Secondary | ICD-10-CM | POA: Insufficient documentation

## 2022-08-19 DIAGNOSIS — Z79899 Other long term (current) drug therapy: Secondary | ICD-10-CM | POA: Diagnosis not present

## 2022-08-19 DIAGNOSIS — Z7901 Long term (current) use of anticoagulants: Secondary | ICD-10-CM | POA: Diagnosis not present

## 2022-08-19 DIAGNOSIS — Z7984 Long term (current) use of oral hypoglycemic drugs: Secondary | ICD-10-CM | POA: Diagnosis not present

## 2022-08-19 DIAGNOSIS — G8929 Other chronic pain: Secondary | ICD-10-CM | POA: Diagnosis not present

## 2022-08-19 MED ORDER — LIDOCAINE 5 % EX PTCH
1.0000 | MEDICATED_PATCH | CUTANEOUS | Status: DC
  Administered 2022-08-19: 1 via TRANSDERMAL
  Filled 2022-08-19: qty 1

## 2022-08-19 MED ORDER — NAPROXEN 500 MG PO TABS
500.0000 mg | ORAL_TABLET | Freq: Once | ORAL | Status: AC
  Administered 2022-08-19: 500 mg via ORAL
  Filled 2022-08-19: qty 1
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
## 2022-08-24 ENCOUNTER — Other Ambulatory Visit: Payer: Self-pay

## 2022-08-24 ENCOUNTER — Emergency Department (HOSPITAL_COMMUNITY)
Admission: EM | Admit: 2022-08-24 | Discharge: 2022-08-24 | Disposition: A | Discharge status: Home / Self Care | Payer: 59 | Attending: Emergency Medicine | Admitting: Emergency Medicine

## 2022-08-24 DIAGNOSIS — G8929 Other chronic pain: Secondary | ICD-10-CM | POA: Diagnosis present

## 2022-08-24 DIAGNOSIS — G894 Chronic pain syndrome: Secondary | ICD-10-CM

## 2022-08-24 MED ORDER — GABAPENTIN 300 MG PO CAPS
300.0000 mg | ORAL_CAPSULE | Freq: Once | ORAL | Status: AC
  Administered 2022-08-24: 300 mg via ORAL
  Filled 2022-08-24: qty 1

## 2022-08-24 NOTE — ED Provider Triage Note (Signed)
Emergency Medicine Provider Triage Evaluation Note  [name] , a 77 y.o. female  was evaluated in triage.  Pt complains of pain in both hands and head pain that has been present for the past 2 months.  Patient was seen multiple times in the past with most recent being 08/19/2022 and states there have been no changes in her symptoms.  Patient does not take any pain meds at home and she states Tylenol does not help her.  Patient denies any new onset weakness.  Patient denies any new trauma since being discharged or changes in her symptoms.  Review of Systems  Positive: Workup initiated Negative: Workup initiated  Physical Exam  BP (!) 176/77 (BP Location: Left Arm)   Pulse 73   Temp 97.9 F (36.6 C) (Oral)   Resp 14   SpO2 100%  Gen:   Awake, no distress   Resp:  Normal effort  MSK:   Moves extremities without difficulty  Other:  5 out of 5 bilateral grip strength, sensation intact distally, 2+ bilateral radial pulses, presented in c-collar but denies neck pain  Medical Decision Making  Medically screening exam initiated at 12:54 PM.  Appropriate orders placed.  [name] was informed that the remainder of the evaluation will be completed by another provider, this initial triage assessment does not replace that evaluation, and the importance of remaining in the ED until their evaluation is complete.  Workup initiated, patient stable at this time   Remi Deter 08/24/22 1257

## 2022-08-24 NOTE — ED Notes (Signed)
Awaiting patient from lobby 
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
## 2022-08-24 NOTE — Discharge Instructions (Signed)
You need to establish care with a primary care doctor for better management of your symptoms.  Return to the emergency room if you have any worsening symptoms.
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
## 2022-08-27 ENCOUNTER — Emergency Department (HOSPITAL_COMMUNITY)
Admission: EM | Admit: 2022-08-27 | Discharge: 2022-08-28 | Disposition: A | Discharge status: Home / Self Care | Payer: 59 | Attending: Emergency Medicine | Admitting: Emergency Medicine

## 2022-08-27 ENCOUNTER — Emergency Department (HOSPITAL_COMMUNITY): Payer: 59

## 2022-08-27 ENCOUNTER — Encounter (HOSPITAL_COMMUNITY): Payer: Self-pay

## 2022-08-27 DIAGNOSIS — Z7984 Long term (current) use of oral hypoglycemic drugs: Secondary | ICD-10-CM | POA: Insufficient documentation

## 2022-08-27 DIAGNOSIS — Z79899 Other long term (current) drug therapy: Secondary | ICD-10-CM | POA: Insufficient documentation

## 2022-08-27 DIAGNOSIS — E119 Type 2 diabetes mellitus without complications: Secondary | ICD-10-CM | POA: Insufficient documentation

## 2022-08-27 DIAGNOSIS — M19041 Primary osteoarthritis, right hand: Secondary | ICD-10-CM | POA: Insufficient documentation

## 2022-08-27 DIAGNOSIS — M19042 Primary osteoarthritis, left hand: Secondary | ICD-10-CM | POA: Insufficient documentation

## 2022-08-27 DIAGNOSIS — I10 Essential (primary) hypertension: Secondary | ICD-10-CM | POA: Diagnosis not present

## 2022-08-27 DIAGNOSIS — M79642 Pain in left hand: Secondary | ICD-10-CM | POA: Diagnosis not present

## 2022-08-27 DIAGNOSIS — M79641 Pain in right hand: Secondary | ICD-10-CM | POA: Diagnosis present

## 2022-08-27 DIAGNOSIS — Z7902 Long term (current) use of antithrombotics/antiplatelets: Secondary | ICD-10-CM | POA: Insufficient documentation

## 2022-08-27 DIAGNOSIS — G894 Chronic pain syndrome: Secondary | ICD-10-CM | POA: Diagnosis not present

## 2022-08-27 DIAGNOSIS — M1990 Unspecified osteoarthritis, unspecified site: Secondary | ICD-10-CM | POA: Insufficient documentation
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
## 2022-08-27 NOTE — ED Triage Notes (Signed)
Patient arrives with complaints of right hand pain. Rates pain 10/10.

## 2022-08-28 ENCOUNTER — Encounter (HOSPITAL_COMMUNITY): Payer: Self-pay

## 2022-08-28 ENCOUNTER — Other Ambulatory Visit: Payer: Self-pay

## 2022-08-28 ENCOUNTER — Emergency Department (HOSPITAL_COMMUNITY)
Admission: EM | Admit: 2022-08-28 | Discharge: 2022-08-28 | Disposition: A | Discharge status: Home / Self Care | Payer: 59 | Attending: Emergency Medicine | Admitting: Emergency Medicine

## 2022-08-28 DIAGNOSIS — Z7984 Long term (current) use of oral hypoglycemic drugs: Secondary | ICD-10-CM | POA: Insufficient documentation

## 2022-08-28 DIAGNOSIS — Z7902 Long term (current) use of antithrombotics/antiplatelets: Secondary | ICD-10-CM | POA: Insufficient documentation

## 2022-08-28 DIAGNOSIS — M19041 Primary osteoarthritis, right hand: Secondary | ICD-10-CM | POA: Diagnosis not present

## 2022-08-28 DIAGNOSIS — G894 Chronic pain syndrome: Secondary | ICD-10-CM | POA: Insufficient documentation

## 2022-08-28 DIAGNOSIS — Z79899 Other long term (current) drug therapy: Secondary | ICD-10-CM | POA: Insufficient documentation

## 2022-08-28 DIAGNOSIS — I10 Essential (primary) hypertension: Secondary | ICD-10-CM | POA: Insufficient documentation

## 2022-08-28 DIAGNOSIS — E119 Type 2 diabetes mellitus without complications: Secondary | ICD-10-CM | POA: Insufficient documentation

## 2022-08-28 MED ORDER — IBUPROFEN 400 MG PO TABS
400.0000 mg | ORAL_TABLET | Freq: Once | ORAL | Status: AC
  Administered 2022-08-28: 400 mg via ORAL
  Filled 2022-08-28: qty 1

## 2022-08-28 MED ORDER — NAPROXEN 250 MG PO TABS
500.0000 mg | ORAL_TABLET | Freq: Once | ORAL | Status: AC
  Administered 2022-08-28: 500 mg via ORAL
  Filled 2022-08-28: qty 2

## 2022-08-28 MED ORDER — NAPROXEN 500 MG PO TABS: 500.0000 mg | ORAL_TABLET | Freq: Two times a day (BID) | ORAL | 0 refills | Status: DC
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
## 2022-08-28 NOTE — Discharge Instructions (Addendum)
You were seen in the ER for ongoing pain.  We gave you a medicine called naproxen, which you can continue to take. I have sent a prescription to your pharmacy on file.

## 2022-08-28 NOTE — Discharge Instructions (Signed)
Your imaging studies today did not show any new findings.  You have arthritis in your hands.  Take Tylenol as needed.
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
## 2022-08-28 NOTE — ED Notes (Signed)
Pt is a&ox4, warm and dry to touch. Pt is complaining of all over body aches. She is wrapped up in multiple blankets. Side rails up x 2, close to RN station should she need anything.

## 2022-08-30 ENCOUNTER — Encounter (HOSPITAL_COMMUNITY): Payer: Self-pay

## 2022-08-30 ENCOUNTER — Emergency Department (HOSPITAL_COMMUNITY)
Admission: EM | Admit: 2022-08-30 | Discharge: 2022-08-30 | Disposition: A | Discharge status: Home / Self Care | Payer: 59 | Attending: Emergency Medicine | Admitting: Emergency Medicine

## 2022-08-30 DIAGNOSIS — Z7984 Long term (current) use of oral hypoglycemic drugs: Secondary | ICD-10-CM | POA: Insufficient documentation

## 2022-08-30 DIAGNOSIS — Z79899 Other long term (current) drug therapy: Secondary | ICD-10-CM | POA: Insufficient documentation

## 2022-08-30 DIAGNOSIS — M79642 Pain in left hand: Secondary | ICD-10-CM | POA: Diagnosis not present

## 2022-08-30 DIAGNOSIS — E119 Type 2 diabetes mellitus without complications: Secondary | ICD-10-CM | POA: Insufficient documentation

## 2022-08-30 DIAGNOSIS — M79641 Pain in right hand: Secondary | ICD-10-CM | POA: Insufficient documentation

## 2022-08-30 DIAGNOSIS — I10 Essential (primary) hypertension: Secondary | ICD-10-CM | POA: Diagnosis not present

## 2022-08-30 MED ORDER — NAPROXEN 250 MG PO TABS
500.0000 mg | ORAL_TABLET | Freq: Once | ORAL | Status: AC
  Administered 2022-08-30: 500 mg via ORAL
  Filled 2022-08-30: qty 2

## 2022-08-30 MED ORDER — NAPROXEN 500 MG PO TABS: 500.0000 mg | ORAL_TABLET | Freq: Two times a day (BID) | ORAL | 0 refills | Status: DC

## 2022-08-30 NOTE — Discharge Instructions (Addendum)
Your history and exam today are consistent with recurrent chronic pain in your bilateral hands from your previous injury.  As you just recently had imaging that did not show acute injuries within the last several days, we feel you are safe without more extensive lab and imaging workup at this time.  With shared decision-making conversation you agreed.  Please use the new pain medicine which you have tolerated many times in the past to help and follow-up with your primary doctor.  If any symptoms change or worsen, return to the nearest emergency department.

## 2022-08-30 NOTE — ED Triage Notes (Signed)
Pt is coming in today with the chronic issue of hand pain that she is reguarly seen for but also some complaints of head pain as well and leg pain. She is ambulatory ito triage and states she didn't take any medications tonight and tylenol does not work.

## 2022-08-30 NOTE — ED Provider Notes (Signed)
[place] EMERGENCY DEPARTMENT AT [facility] Provider Note   CSN: [id number] Arrival date & time: 08/30/22  2143     History  Chief Complaint  Patient presents with   Pain    [name] is a 77 y.o. female.  The history is provided by the patient and medical records. No language interpreter was used.  Hand Pain This is a chronic problem. The current episode started more than 1 week ago. The problem occurs constantly. The problem has not changed since onset.Pertinent negatives include no chest pain, no abdominal pain, no headaches and no shortness of breath. Nothing aggravates the symptoms. Nothing relieves the symptoms. She has tried nothing for the symptoms. The treatment provided no relief.       Home Medications Prior to Admission medications   Medication Sig Start Date End Date Taking? Authorizing Provider  acetaminophen (TYLENOL) 500 MG tablet Take 1 tablet (500 mg total) by mouth every 6 (six) hours as needed. 06/28/22   Glyn Ade, MD  albuterol (VENTOLIN HFA) 108 (90 Base) MCG/ACT inhaler Inhale 1-2 puffs into the lungs every 6 (six) hours as needed for wheezing or shortness of breath. 04/22/20   Barbette Merino, NP  amLODipine (NORVASC) 10 MG tablet Take 1 tablet (10 mg total) by mouth daily. 05/31/22   Dione Booze, MD  chlorthalidone (HYGROTON) 25 MG tablet TAKE 1 TABLET (25 MG TOTAL) BY MOUTH DAILY. 11/20/21 11/20/22  Donell Beers, FNP  clopidogrel (PLAVIX) 75 MG tablet Take 1 tablet (75 mg total) by mouth daily. 06/15/21   Elige Radon, MD  cyclobenzaprine (FLEXERIL) 10 MG tablet Take 1 tablet (10 mg total) by mouth 2 (two) times daily as needed for muscle spasms. 08/09/22   Carroll Sage, PA-C  diclofenac Sodium (VOLTAREN) 1 % GEL Apply 2 g topically 4 (four) times daily. 05/22/22   [name], PA-C  diclofenac Sodium (VOLTAREN) 1 % GEL Apply 2 g topically 4 (four) times daily. 07/28/22   Schutt, Edsel Petrin, PA-C  gabapentin (NEURONTIN) 300  MG capsule Take 1 capsule (300 mg total) by mouth 3 (three) times daily. 03/19/22 04/18/22  Alvira Monday, MD  gabapentin (NEURONTIN) 600 MG tablet Take 0.5 tablets (300 mg total) by mouth 3 (three) times daily. 08/12/22 09/11/22  Kommor, Madison, MD  lidocaine (LIDODERM) 5 % Place 1 patch onto the skin daily. Remove & Discard patch within 12 hours or as directed by MD 07/15/22   Linwood Dibbles, MD  losartan (COZAAR) 50 MG tablet Take 1 tablet (50 mg total) by mouth daily. 04/20/22 05/20/22  Roemhildt, Lorin T, PA-C  metFORMIN (GLUCOPHAGE) 500 MG tablet Take 1 tablet (500 mg total) by mouth 2 (two) times daily with a meal. 09/05/21 09/05/22  [name] A, DO  naproxen (NAPROSYN) 500 MG tablet Take 1 tablet (500 mg total) by mouth 2 (two) times daily. 08/28/22   Roemhildt, Lorin T, PA-C  pantoprazole (PROTONIX) 20 MG tablet Take 1 tablet (20 mg total) by mouth daily. 10/10/21   [name], MD  rosuvastatin (CRESTOR) 10 MG tablet Take 1 tablet (10 mg total) by mouth at bedtime. 06/15/21 06/15/22  Elige Radon, MD  traMADol (ULTRAM) 50 MG tablet Take 1 tablet (50 mg total) by mouth every 6 (six) hours as needed. 05/31/22   Dione Booze, MD      Allergies    Codeine, Ibuprofen, and Imdur [isosorbide nitrate]    Review of Systems   Review of Systems  Constitutional:  Negative for chills,  fatigue and fever.  HENT:  Negative for congestion.   Respiratory:  Negative for cough, chest tightness and shortness of breath.   Cardiovascular:  Negative for chest pain.  Gastrointestinal:  Negative for abdominal pain, diarrhea and nausea.  Genitourinary:  Negative for flank pain.  Musculoskeletal:  Negative for back pain.  Neurological:  Negative for headaches.  All other systems reviewed and are negative.   Physical Exam Updated Vital Signs BP (!) 164/64   Pulse 81   Temp 98.3 F (36.8 C)   Resp 17   SpO2 100%  Physical Exam Vitals and nursing note reviewed.  Constitutional:      General: She is not  in acute distress.    Appearance: She is well-developed.  HENT:     Head: Normocephalic and atraumatic.  Eyes:     Conjunctiva/sclera: Conjunctivae normal.  Cardiovascular:     Rate and Rhythm: Normal rate and regular rhythm.     Heart sounds: No murmur heard. Pulmonary:     Effort: Pulmonary effort is normal. No respiratory distress.     Breath sounds: Normal breath sounds.  Abdominal:     Palpations: Abdomen is soft.     Tenderness: There is no abdominal tenderness.  Musculoskeletal:        General: Tenderness present. No swelling or signs of injury.     Cervical back: Neck supple.  Skin:    General: Skin is warm and dry.     Capillary Refill: Capillary refill takes less than 2 seconds.     Findings: No erythema or rash.  Neurological:     General: No focal deficit present.     Mental Status: She is alert.     Sensory: No sensory deficit.     Motor: No weakness.  Psychiatric:        Mood and Affect: Mood normal.     ED Results / Procedures / Treatments   Labs (all labs ordered are listed, but only abnormal results are displayed) Labs Reviewed - No data to display  EKG None  Radiology No results found.  Procedures Procedures    Medications Ordered in ED Medications  naproxen (NAPROSYN) tablet 500 mg (500 mg Oral Given 08/30/22 2306)    ED Course/ Medical Decision Making/ A&P                                 Medical Decision Making Risk Prescription drug management.    [name] is a 77 y.o. female with a past medical history significant for hypertension, GERD, diabetes, previous stroke, and arthritis who presents with chronic bilateral hand pain.  Patient reports that her hands are hurting and Tylenol has not been helping.  Patient denies any new trauma.  Denies fevers, chills, congestion, cough, nausea, vomiting, constipation, diarrhea, or urinary changes.  Only reports he is having recurrent pain.  She is wearing a cervical immobilization collar that  she presents with regularly.  She has not complained to me of any new neck pain at this time.  On exam, lungs clear.  Chest nontender.  Patient has some mild tenderness in her hands but no erythema seen.  Intact strength and sensation and pulses.  Normal range of motion of the fingers initially.  Exam otherwise unremarkable.  Had a shared decision-making conversation with patient.  As she had x-rays within the last several days and has no new injuries, she agrees to hold on extensive  lab or imaging workup.  Patient just wants me to help with the pain.  Chart review shows that she has done very well with naproxen in the past.  Will give her a dose of naproxen tonight and will print a prescription for her to have some.  Patient agrees with this plan and agrees with no further workup.  Patient tolerated juice and is feeling better.  Patient discharged for outpatient follow-up.             Final Clinical Impression(s) / ED Diagnoses Final diagnoses:  Bilateral hand pain    Rx / DC Orders ED Discharge Orders          Ordered    naproxen (NAPROSYN) 500 MG tablet  2 times daily        08/30/22 2258            Clinical Impression: 1. Bilateral hand pain     Disposition: Discharge  Condition: Good  I have discussed the results, Dx and Tx plan with the pt(& family if present). He/she/they expressed understanding and agree(s) with the plan. Discharge instructions discussed at great length. Strict return precautions discussed and pt &/or family have verbalized understanding of the instructions. No further questions at time of discharge.    Discharge Medication List as of 08/30/2022 10:58 PM     START taking these medications   Details  !! naproxen (NAPROSYN) 500 MG tablet Take 1 tablet (500 mg total) by mouth 2 (two) times daily., Starting Thu 08/30/2022, Print     !! - Potential duplicate medications found. Please discuss with provider.      Follow Up: [facility] [address] [place] [address] [place] [address] [phone] Schedule an appointment as soon as possible for a visit        , Canary Brim, MD 08/30/22 2350

## 2022-09-04 ENCOUNTER — Emergency Department (HOSPITAL_COMMUNITY)
Admission: EM | Admit: 2022-09-04 | Discharge: 2022-09-05 | Disposition: A | Discharge status: Home / Self Care | Payer: 59 | Attending: Emergency Medicine | Admitting: Emergency Medicine

## 2022-09-04 ENCOUNTER — Other Ambulatory Visit: Payer: Self-pay

## 2022-09-04 ENCOUNTER — Encounter (HOSPITAL_COMMUNITY): Payer: Self-pay

## 2022-09-04 DIAGNOSIS — Z7984 Long term (current) use of oral hypoglycemic drugs: Secondary | ICD-10-CM | POA: Insufficient documentation

## 2022-09-04 DIAGNOSIS — Z79899 Other long term (current) drug therapy: Secondary | ICD-10-CM | POA: Diagnosis not present

## 2022-09-04 DIAGNOSIS — G894 Chronic pain syndrome: Secondary | ICD-10-CM | POA: Diagnosis not present

## 2022-09-04 DIAGNOSIS — Z7902 Long term (current) use of antithrombotics/antiplatelets: Secondary | ICD-10-CM | POA: Insufficient documentation

## 2022-09-04 DIAGNOSIS — M7989 Other specified soft tissue disorders: Secondary | ICD-10-CM | POA: Diagnosis present

## 2022-09-04 NOTE — ED Triage Notes (Signed)
Patient arrived by walking with chronic issue of hand pain that she is reguarly seen for but also some complaints of head pain as well and leg pain.   She is ambulatory ito triage and states she didn't take any medications tonight and tylenol does not work.

## 2022-09-05 ENCOUNTER — Emergency Department (HOSPITAL_COMMUNITY)
Admission: EM | Admit: 2022-09-05 | Discharge: 2022-09-06 | Disposition: A | Discharge status: Home / Self Care | Payer: 59 | Attending: Emergency Medicine | Admitting: Emergency Medicine

## 2022-09-05 ENCOUNTER — Other Ambulatory Visit: Payer: Self-pay

## 2022-09-05 ENCOUNTER — Encounter (HOSPITAL_COMMUNITY): Payer: Self-pay

## 2022-09-05 DIAGNOSIS — Z79899 Other long term (current) drug therapy: Secondary | ICD-10-CM | POA: Insufficient documentation

## 2022-09-05 DIAGNOSIS — Z7902 Long term (current) use of antithrombotics/antiplatelets: Secondary | ICD-10-CM | POA: Insufficient documentation

## 2022-09-05 DIAGNOSIS — Z7984 Long term (current) use of oral hypoglycemic drugs: Secondary | ICD-10-CM | POA: Insufficient documentation

## 2022-09-05 DIAGNOSIS — G894 Chronic pain syndrome: Secondary | ICD-10-CM | POA: Insufficient documentation

## 2022-09-05 MED ORDER — NAPROXEN 250 MG PO TABS
500.0000 mg | ORAL_TABLET | Freq: Once | ORAL | Status: AC
  Administered 2022-09-05: 500 mg via ORAL
  Filled 2022-09-05: qty 2

## 2022-09-05 NOTE — ED Triage Notes (Signed)
Pt reports bilateral hand and feet pain, states this has been going on "for a long time." She is ambulatory with independent steady gait.
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
## 2022-09-05 NOTE — Discharge Instructions (Addendum)
You were evaluated today for chronic pain which improved with Naprosyn.  You may take the previously prescribed Naprosyn or buy this over-the-counter as needed but do not take more than is indicated on the bottle.  Please note that this medicine can cause GI upset when used for a long period of time.

## 2022-09-06 ENCOUNTER — Other Ambulatory Visit: Payer: Self-pay

## 2022-09-06 DIAGNOSIS — G894 Chronic pain syndrome: Secondary | ICD-10-CM | POA: Diagnosis not present

## 2022-09-06 MED ORDER — LIDOCAINE 5 % EX PTCH: 1.0000 | MEDICATED_PATCH | CUTANEOUS | Status: DC

## 2022-09-06 MED ORDER — NAPROXEN 250 MG PO TABS
500.0000 mg | ORAL_TABLET | Freq: Once | ORAL | Status: AC
  Administered 2022-09-06: 500 mg via ORAL
  Filled 2022-09-06: qty 2

## 2022-09-06 MED ORDER — LIDOCAINE 5 % EX PTCH: 1.0000 | MEDICATED_PATCH | CUTANEOUS | 0 refills | Status: DC

## 2022-09-06 NOTE — ED Notes (Signed)
Patient complains of generalized body aches which is chronic and hand swelling bilateral. Patient here almost everyday for same.

## 2022-09-06 NOTE — Discharge Instructions (Addendum)
You were evaluated this morning for chronic pain and administered Naprosyn. Prescriptions for this medication were provided to you last week. Please fill this prescription and use the medication for pain control.
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
## 2022-09-09 ENCOUNTER — Encounter (HOSPITAL_COMMUNITY): Payer: Self-pay

## 2022-09-09 ENCOUNTER — Other Ambulatory Visit: Payer: Self-pay

## 2022-09-09 ENCOUNTER — Emergency Department (HOSPITAL_COMMUNITY)
Admission: EM | Admit: 2022-09-09 | Discharge: 2022-09-10 | Disposition: A | Discharge status: Home / Self Care | Payer: 59 | Attending: Emergency Medicine | Admitting: Emergency Medicine

## 2022-09-09 DIAGNOSIS — E119 Type 2 diabetes mellitus without complications: Secondary | ICD-10-CM | POA: Insufficient documentation

## 2022-09-09 DIAGNOSIS — Z79899 Other long term (current) drug therapy: Secondary | ICD-10-CM | POA: Insufficient documentation

## 2022-09-09 DIAGNOSIS — Z5321 Procedure and treatment not carried out due to patient leaving prior to being seen by health care provider: Secondary | ICD-10-CM | POA: Diagnosis not present

## 2022-09-09 DIAGNOSIS — G8929 Other chronic pain: Secondary | ICD-10-CM | POA: Insufficient documentation

## 2022-09-09 DIAGNOSIS — Z7902 Long term (current) use of antithrombotics/antiplatelets: Secondary | ICD-10-CM | POA: Diagnosis not present

## 2022-09-09 DIAGNOSIS — Z7984 Long term (current) use of oral hypoglycemic drugs: Secondary | ICD-10-CM | POA: Diagnosis not present

## 2022-09-09 DIAGNOSIS — R52 Pain, unspecified: Secondary | ICD-10-CM | POA: Diagnosis present

## 2022-09-09 DIAGNOSIS — I10 Essential (primary) hypertension: Secondary | ICD-10-CM | POA: Insufficient documentation

## 2022-09-09 NOTE — ED Triage Notes (Signed)
Pt came in via POV d/t bil hand & bil leg pain that [place] city [place]per pt). A/Ox4, rates her pain 10/10 & endorses she has a hard time bending her fingers.

## 2022-09-10 ENCOUNTER — Emergency Department (HOSPITAL_COMMUNITY)
Admission: EM | Admit: 2022-09-10 | Discharge: 2022-09-10 | Discharge status: Against Medical Advice | Payer: 59 | Attending: Emergency Medicine | Admitting: Emergency Medicine

## 2022-09-10 ENCOUNTER — Emergency Department (HOSPITAL_COMMUNITY)
Admission: EM | Admit: 2022-09-10 | Discharge: 2022-09-11 | Disposition: A | Discharge status: Home / Self Care | Payer: 59 | Source: Home / Self Care | Attending: Emergency Medicine | Admitting: Emergency Medicine

## 2022-09-10 ENCOUNTER — Encounter (HOSPITAL_COMMUNITY): Payer: Self-pay | Admitting: Emergency Medicine

## 2022-09-10 ENCOUNTER — Encounter (HOSPITAL_COMMUNITY): Payer: Self-pay

## 2022-09-10 ENCOUNTER — Other Ambulatory Visit: Payer: Self-pay

## 2022-09-10 DIAGNOSIS — Z5321 Procedure and treatment not carried out due to patient leaving prior to being seen by health care provider: Secondary | ICD-10-CM | POA: Insufficient documentation

## 2022-09-10 DIAGNOSIS — M791 Myalgia, unspecified site: Secondary | ICD-10-CM | POA: Insufficient documentation

## 2022-09-10 DIAGNOSIS — Z7902 Long term (current) use of antithrombotics/antiplatelets: Secondary | ICD-10-CM | POA: Insufficient documentation

## 2022-09-10 DIAGNOSIS — G8929 Other chronic pain: Secondary | ICD-10-CM | POA: Insufficient documentation

## 2022-09-10 DIAGNOSIS — I10 Essential (primary) hypertension: Secondary | ICD-10-CM | POA: Insufficient documentation

## 2022-09-10 DIAGNOSIS — E119 Type 2 diabetes mellitus without complications: Secondary | ICD-10-CM | POA: Insufficient documentation

## 2022-09-10 DIAGNOSIS — Z79899 Other long term (current) drug therapy: Secondary | ICD-10-CM | POA: Insufficient documentation

## 2022-09-10 MED ORDER — ACETAMINOPHEN 500 MG PO TABS
1000.0000 mg | ORAL_TABLET | Freq: Once | ORAL | Status: AC
  Administered 2022-09-10: 1000 mg via ORAL
  Filled 2022-09-10: qty 2

## 2022-09-10 MED ORDER — ACETAMINOPHEN 500 MG PO TABS: 1000.0000 mg | ORAL_TABLET | Freq: Once | ORAL | Status: DC

## 2022-09-10 NOTE — Discharge Instructions (Signed)
Can continue tylenol or motrin as needed for pain. Follow-up with your doctor. Return here for new concerns.
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
## 2022-09-13 ENCOUNTER — Other Ambulatory Visit: Payer: Self-pay

## 2022-09-13 ENCOUNTER — Encounter (HOSPITAL_COMMUNITY): Payer: Self-pay | Admitting: Emergency Medicine

## 2022-09-13 ENCOUNTER — Emergency Department (HOSPITAL_COMMUNITY)
Admission: EM | Admit: 2022-09-13 | Discharge: 2022-09-13 | Disposition: A | Discharge status: Home / Self Care | Payer: 59 | Attending: Emergency Medicine | Admitting: Emergency Medicine

## 2022-09-13 DIAGNOSIS — I10 Essential (primary) hypertension: Secondary | ICD-10-CM | POA: Insufficient documentation

## 2022-09-13 DIAGNOSIS — E119 Type 2 diabetes mellitus without complications: Secondary | ICD-10-CM | POA: Insufficient documentation

## 2022-09-13 DIAGNOSIS — Z7901 Long term (current) use of anticoagulants: Secondary | ICD-10-CM | POA: Diagnosis not present

## 2022-09-13 DIAGNOSIS — M791 Myalgia, unspecified site: Secondary | ICD-10-CM | POA: Diagnosis present

## 2022-09-13 DIAGNOSIS — Z79899 Other long term (current) drug therapy: Secondary | ICD-10-CM | POA: Insufficient documentation

## 2022-09-13 DIAGNOSIS — Z7984 Long term (current) use of oral hypoglycemic drugs: Secondary | ICD-10-CM | POA: Diagnosis not present

## 2022-09-13 DIAGNOSIS — R52 Pain, unspecified: Secondary | ICD-10-CM

## 2022-09-13 MED ORDER — NAPROXEN 375 MG PO TABS: 375.0000 mg | ORAL_TABLET | Freq: Two times a day (BID) | ORAL | 0 refills | Status: DC | PRN

## 2022-09-13 MED ORDER — NAPROXEN 250 MG PO TABS
375.0000 mg | ORAL_TABLET | Freq: Once | ORAL | Status: AC
  Administered 2022-09-13: 375 mg via ORAL
  Filled 2022-09-13: qty 2

## 2022-09-13 NOTE — ED Triage Notes (Signed)
Pt presents with body pain, worse in arms and hands.  Tylenol isn't working

## 2022-09-13 NOTE — ED Provider Notes (Signed)
[place] EMERGENCY DEPARTMENT AT [facility] Provider Note   CSN: [id number] Arrival date & time: 09/13/22  1943     History  Chief Complaint  Patient presents with   Generalized Body Aches    [name] is a 77 y.o. female.  The history is provided by the patient and medical records. No language interpreter was used.  Illness Location:  Chronic body aches Severity:  Mild Onset quality:  Gradual Duration: chronic and unchanged per pt. Timing:  Constant Progression:  Waxing and waning Chronicity:  Chronic Associated symptoms: no abdominal pain, no chest pain, no congestion, no cough, no diarrhea, no fatigue, no fever, no headaches, no nausea, no rash, no shortness of breath and no vomiting        Home Medications Prior to Admission medications   Medication Sig Start Date End Date Taking? Authorizing Provider  acetaminophen (TYLENOL) 500 MG tablet Take 1 tablet (500 mg total) by mouth every 6 (six) hours as needed. 06/28/22   Glyn Ade, MD  albuterol (VENTOLIN HFA) 108 (90 Base) MCG/ACT inhaler Inhale 1-2 puffs into the lungs every 6 (six) hours as needed for wheezing or shortness of breath. 04/22/20   Barbette Merino, NP  amLODipine (NORVASC) 10 MG tablet Take 1 tablet (10 mg total) by mouth daily. 05/31/22   Dione Booze, MD  chlorthalidone (HYGROTON) 25 MG tablet TAKE 1 TABLET (25 MG TOTAL) BY MOUTH DAILY. 11/20/21 11/20/22  Donell Beers, FNP  clopidogrel (PLAVIX) 75 MG tablet Take 1 tablet (75 mg total) by mouth daily. 06/15/21   Elige Radon, MD  cyclobenzaprine (FLEXERIL) 10 MG tablet Take 1 tablet (10 mg total) by mouth 2 (two) times daily as needed for muscle spasms. 08/09/22   Carroll Sage, PA-C  diclofenac Sodium (VOLTAREN) 1 % GEL Apply 2 g topically 4 (four) times daily. 05/22/22   [name], PA-C  diclofenac Sodium (VOLTAREN) 1 % GEL Apply 2 g topically 4 (four) times daily. 07/28/22   Schutt, Edsel Petrin, PA-C  gabapentin (NEURONTIN)  300 MG capsule Take 1 capsule (300 mg total) by mouth 3 (three) times daily. 03/19/22 04/18/22  Alvira Monday, MD  gabapentin (NEURONTIN) 600 MG tablet Take 0.5 tablets (300 mg total) by mouth 3 (three) times daily. 08/12/22 09/11/22  Kommor, Madison, MD  lidocaine (LIDODERM) 5 % Place 1 patch onto the skin daily. Remove & Discard patch within 12 hours or as directed by MD 07/15/22   Linwood Dibbles, MD  lidocaine (LIDODERM) 5 % Place 1 patch onto the skin daily. Remove & Discard patch within 12 hours or as directed by MD 09/06/22   Darrick Grinder, PA-C  losartan (COZAAR) 50 MG tablet Take 1 tablet (50 mg total) by mouth daily. 04/20/22 05/20/22  Roemhildt, Lorin T, PA-C  metFORMIN (GLUCOPHAGE) 500 MG tablet Take 1 tablet (500 mg total) by mouth 2 (two) times daily with a meal. 09/05/21 09/05/22  [name] A, DO  naproxen (NAPROSYN) 500 MG tablet Take 1 tablet (500 mg total) by mouth 2 (two) times daily. 08/28/22   Roemhildt, Lorin T, PA-C  naproxen (NAPROSYN) 500 MG tablet Take 1 tablet (500 mg total) by mouth 2 (two) times daily. 08/30/22   Dulcinea Kinser, Canary Brim, MD  pantoprazole (PROTONIX) 20 MG tablet Take 1 tablet (20 mg total) by mouth daily. 10/10/21   [name], MD  rosuvastatin (CRESTOR) 10 MG tablet Take 1 tablet (10 mg total) by mouth at bedtime. 06/15/21 06/15/22  Elige Radon, MD  traMADol (ULTRAM) 50 MG tablet Take 1 tablet (50 mg total) by mouth every 6 (six) hours as needed. 05/31/22   Dione Booze, MD      Allergies    Codeine, Ibuprofen, and Imdur [isosorbide nitrate]    Review of Systems   Review of Systems  Constitutional:  Negative for chills, fatigue and fever.  HENT:  Negative for congestion.   Respiratory:  Negative for cough, chest tightness and shortness of breath.   Cardiovascular:  Negative for chest pain.  Gastrointestinal:  Negative for abdominal pain, constipation, diarrhea, nausea and vomiting.  Musculoskeletal:  Negative for back pain and neck pain.  Skin:   Negative for rash and wound.  Neurological:  Negative for headaches.  Psychiatric/Behavioral:  Negative for agitation and confusion.   All other systems reviewed and are negative.   Physical Exam Updated Vital Signs BP 137/62 (BP Location: Right Arm)   Pulse 78   Temp 98.1 F (36.7 C) (Oral)   Resp 16   SpO2 98%  Physical Exam Vitals and nursing note reviewed.  Constitutional:      General: She is not in acute distress.    Appearance: She is well-developed.  HENT:     Head: Normocephalic and atraumatic.     Nose: No congestion or rhinorrhea.     Mouth/Throat:     Mouth: Mucous membranes are moist.     Pharynx: No oropharyngeal exudate or posterior oropharyngeal erythema.  Eyes:     Extraocular Movements: Extraocular movements intact.     Conjunctiva/sclera: Conjunctivae normal.     Pupils: Pupils are equal, round, and reactive to light.  Neck:     Comments: In C collar chronically  Cardiovascular:     Rate and Rhythm: Normal rate and regular rhythm.     Heart sounds: No murmur heard. Pulmonary:     Effort: Pulmonary effort is normal. No respiratory distress.     Breath sounds: Normal breath sounds. No wheezing, rhonchi or rales.  Chest:     Chest wall: No tenderness.  Abdominal:     General: Abdomen is flat.     Palpations: Abdomen is soft.     Tenderness: There is no abdominal tenderness. There is no right CVA tenderness, left CVA tenderness, guarding or rebound.  Musculoskeletal:        General: Tenderness (arms) present. No swelling.     Cervical back: Neck supple.  Skin:    General: Skin is warm and dry.     Capillary Refill: Capillary refill takes less than 2 seconds.     Findings: No erythema.  Neurological:     General: No focal deficit present.     Mental Status: She is alert. Mental status is at baseline.  Psychiatric:        Mood and Affect: Mood normal.     ED Results / Procedures / Treatments   Labs (all labs ordered are listed, but only  abnormal results are displayed) Labs Reviewed - No data to display  EKG None  Radiology No results found.  Procedures Procedures    Medications Ordered in ED Medications - No data to display  ED Course/ Medical Decision Making/ A&P                                 Medical Decision Making Risk Prescription drug management.    Amandah Koors is a 77 y.o. female with a past medical history  significant for hypertension, previous stroke, GERD, diabetes, and chronic pain who presents for chronic pain.  According to patient, she has pain in her arms and hands and diffuse body pain.  This is the same pain she reports she has had for months and years.  She reports Tylenol has not helped her.  She denies any new trauma or new injuries and denies any fevers, chills, congestion, cough, nausea, vomiting, constipation, diarrhea, or urinary changes.  Of note, I have personally seen this patient in the last few weeks and she has had numerous visits for chronic pain.  On exam, lungs clear.  Chest nontender.  Abdomen nontender.  She has some pain in both hands similar to when I saw her previously.  She says there is no new problem and she just wants some medicine to help with her symptoms.  She reports Tylenol does not help.  Chart review shows that she was given naproxen previously and had significant relief.  She was given a dose of this.  She reports symptoms improved and she would like to go.  Patient given prescription for this and she will follow-up with a primary doctor.  We agreed to hold on more extensive workup given her otherwise well appearance.  Patient did ask me for money to help her get food and she was offered some food in the emergency department.  Patient will be discharged for outpatient follow-up.        Final Clinical Impression(s) / ED Diagnoses Final diagnoses:  Body aches    Rx / DC Orders ED Discharge Orders          Ordered    naproxen (NAPROSYN) 375 MG tablet   2 times daily PRN        09/13/22 2320            Clinical Impression: 1. Body aches     Disposition: Discharge  Condition: Good  I have discussed the results, Dx and Tx plan with the pt(& family if present). He/she/they expressed understanding and agree(s) with the plan. Discharge instructions discussed at great length. Strict return precautions discussed and pt &/or family have verbalized understanding of the instructions. No further questions at time of discharge.    Discharge Medication List as of 09/13/2022 11:21 PM     START taking these medications   Details  !! naproxen (NAPROSYN) 375 MG tablet Take 1 tablet (375 mg total) by mouth 2 (two) times daily as needed for moderate pain., Starting Thu 09/13/2022, Print     !! - Potential duplicate medications found. Please discuss with provider.      Follow Up: [facility] [address] [place] [address] [phone] Schedule an appointment as soon as possible for a visit    [facility] Emergency Department at [facility] [address] [place] [address] [phone]        Jasmane Brockway, Canary Brim, MD 09/13/22 [phone]

## 2022-09-13 NOTE — Discharge Instructions (Signed)
Your evaluation today is consistent with chronic diffuse muscle pains.  Your exam otherwise reassuring and we agreed together to hold on more extensive workup.  After the single dose of naproxen, your symptoms improved.  We feel you are safe for discharge home at this time.  Please follow-up with your primary doctor and rest and stay hydrated.

## 2022-09-14 ENCOUNTER — Other Ambulatory Visit: Payer: Self-pay

## 2022-09-14 ENCOUNTER — Encounter (HOSPITAL_COMMUNITY): Payer: Self-pay | Admitting: *Deleted

## 2022-09-14 ENCOUNTER — Emergency Department (HOSPITAL_COMMUNITY)
Admission: EM | Admit: 2022-09-14 | Discharge: 2022-09-14 | Disposition: A | Discharge status: Home / Self Care | Payer: 59 | Attending: Emergency Medicine | Admitting: Emergency Medicine

## 2022-09-14 DIAGNOSIS — S50311A Abrasion of right elbow, initial encounter: Secondary | ICD-10-CM | POA: Diagnosis not present

## 2022-09-14 DIAGNOSIS — W1830XA Fall on same level, unspecified, initial encounter: Secondary | ICD-10-CM | POA: Diagnosis not present

## 2022-09-14 DIAGNOSIS — R52 Pain, unspecified: Secondary | ICD-10-CM

## 2022-09-14 DIAGNOSIS — Z8673 Personal history of transient ischemic attack (TIA), and cerebral infarction without residual deficits: Secondary | ICD-10-CM | POA: Insufficient documentation

## 2022-09-14 DIAGNOSIS — S60511A Abrasion of right hand, initial encounter: Secondary | ICD-10-CM

## 2022-09-14 DIAGNOSIS — S60414A Abrasion of right ring finger, initial encounter: Secondary | ICD-10-CM | POA: Insufficient documentation

## 2022-09-14 DIAGNOSIS — M791 Myalgia, unspecified site: Secondary | ICD-10-CM | POA: Insufficient documentation

## 2022-09-14 DIAGNOSIS — Z7984 Long term (current) use of oral hypoglycemic drugs: Secondary | ICD-10-CM | POA: Insufficient documentation

## 2022-09-14 DIAGNOSIS — Z7902 Long term (current) use of antithrombotics/antiplatelets: Secondary | ICD-10-CM | POA: Insufficient documentation

## 2022-09-14 DIAGNOSIS — E119 Type 2 diabetes mellitus without complications: Secondary | ICD-10-CM | POA: Insufficient documentation

## 2022-09-14 DIAGNOSIS — S60416A Abrasion of right little finger, initial encounter: Secondary | ICD-10-CM | POA: Diagnosis not present

## 2022-09-14 DIAGNOSIS — I10 Essential (primary) hypertension: Secondary | ICD-10-CM | POA: Diagnosis not present

## 2022-09-14 DIAGNOSIS — Z79899 Other long term (current) drug therapy: Secondary | ICD-10-CM | POA: Insufficient documentation

## 2022-09-14 DIAGNOSIS — W19XXXA Unspecified fall, initial encounter: Secondary | ICD-10-CM

## 2022-09-14 MED ORDER — NAPROXEN 250 MG PO TABS
500.0000 mg | ORAL_TABLET | Freq: Once | ORAL | Status: AC
  Administered 2022-09-14: 500 mg via ORAL
  Filled 2022-09-14: qty 2

## 2022-09-14 NOTE — ED Provider Notes (Signed)
[place] EMERGENCY DEPARTMENT AT [facility] Provider Note   CSN: [id number] Arrival date & time: 09/14/22  4098     History  Chief Complaint  Patient presents with   Marletta Lor    [name] is a 77 y.o. female with history of diabetes, hypertension, stroke, GERD, who presents the emergency department after a fall.  Patient is very well-known to this department.  She states that when she was leaving the hospital yesterday she fell and sustained abrasions to her right hand and her right elbow.  She did not hit her head or lose consciousness.  She is complaining of worsening overall body pain in which she needs "pain medication" for this.  Of note this is patient's 66th ER visit in the past 6 months.  Fall       Home Medications Prior to Admission medications   Medication Sig Start Date End Date Taking? Authorizing Provider  acetaminophen (TYLENOL) 500 MG tablet Take 1 tablet (500 mg total) by mouth every 6 (six) hours as needed. 06/28/22   Glyn Ade, MD  albuterol (VENTOLIN HFA) 108 (90 Base) MCG/ACT inhaler Inhale 1-2 puffs into the lungs every 6 (six) hours as needed for wheezing or shortness of breath. 04/22/20   Barbette Merino, NP  amLODipine (NORVASC) 10 MG tablet Take 1 tablet (10 mg total) by mouth daily. 05/31/22   Dione Booze, MD  chlorthalidone (HYGROTON) 25 MG tablet TAKE 1 TABLET (25 MG TOTAL) BY MOUTH DAILY. 11/20/21 11/20/22  Donell Beers, FNP  clopidogrel (PLAVIX) 75 MG tablet Take 1 tablet (75 mg total) by mouth daily. 06/15/21   Elige Radon, MD  cyclobenzaprine (FLEXERIL) 10 MG tablet Take 1 tablet (10 mg total) by mouth 2 (two) times daily as needed for muscle spasms. 08/09/22   Carroll Sage, PA-C  diclofenac Sodium (VOLTAREN) 1 % GEL Apply 2 g topically 4 (four) times daily. 05/22/22   [name], PA-C  diclofenac Sodium (VOLTAREN) 1 % GEL Apply 2 g topically 4 (four) times daily. 07/28/22   Schutt, Edsel Petrin, PA-C  gabapentin  (NEURONTIN) 300 MG capsule Take 1 capsule (300 mg total) by mouth 3 (three) times daily. 03/19/22 04/18/22  Alvira Monday, MD  gabapentin (NEURONTIN) 600 MG tablet Take 0.5 tablets (300 mg total) by mouth 3 (three) times daily. 08/12/22 09/11/22  Kommor, Madison, MD  lidocaine (LIDODERM) 5 % Place 1 patch onto the skin daily. Remove & Discard patch within 12 hours or as directed by MD 07/15/22   Linwood Dibbles, MD  lidocaine (LIDODERM) 5 % Place 1 patch onto the skin daily. Remove & Discard patch within 12 hours or as directed by MD 09/06/22   Darrick Grinder, PA-C  losartan (COZAAR) 50 MG tablet Take 1 tablet (50 mg total) by mouth daily. 04/20/22 05/20/22  Trevion Hoben T, PA-C  metFORMIN (GLUCOPHAGE) 500 MG tablet Take 1 tablet (500 mg total) by mouth 2 (two) times daily with a meal. 09/05/21 09/05/22  [name] A, DO  naproxen (NAPROSYN) 375 MG tablet Take 1 tablet (375 mg total) by mouth 2 (two) times daily as needed for moderate pain. 09/13/22   Tegeler, Canary Brim, MD  naproxen (NAPROSYN) 500 MG tablet Take 1 tablet (500 mg total) by mouth 2 (two) times daily. 08/28/22   Taci Sterling T, PA-C  naproxen (NAPROSYN) 500 MG tablet Take 1 tablet (500 mg total) by mouth 2 (two) times daily. 08/30/22   Tegeler, Canary Brim, MD  pantoprazole (PROTONIX) 20  MG tablet Take 1 tablet (20 mg total) by mouth daily. 10/10/21   [name], MD  rosuvastatin (CRESTOR) 10 MG tablet Take 1 tablet (10 mg total) by mouth at bedtime. 06/15/21 06/15/22  Elige Radon, MD  traMADol (ULTRAM) 50 MG tablet Take 1 tablet (50 mg total) by mouth every 6 (six) hours as needed. 05/31/22   Dione Booze, MD      Allergies    Codeine, Ibuprofen, and Imdur [isosorbide nitrate]    Review of Systems   Review of Systems  Musculoskeletal:  Positive for arthralgias.  Skin:  Positive for wound.  All other systems reviewed and are negative.   Physical Exam Updated Vital Signs BP (!) 153/86 (BP Location: Right Arm)   Pulse  74   Temp 98 F (36.7 C)   Resp 18   Ht 5\' 4"  (1.626 m)   Wt 49 kg   SpO2 100%   BMI 18.54 kg/m  Physical Exam Vitals and nursing note reviewed.  Constitutional:      Appearance: Normal appearance.  HENT:     Head: Normocephalic and atraumatic.  Eyes:     Conjunctiva/sclera: Conjunctivae normal.  Neck:     Comments: Wearing c-collar, this is chronic for her Pulmonary:     Effort: Pulmonary effort is normal. No respiratory distress.  Musculoskeletal:     Comments: Ranging all extremities normally  Skin:    General: Skin is warm and dry.     Comments: Superficial abrasions over the surface of the right elbow, right fifth MCP, and right fourth DIP  Neurological:     Mental Status: She is alert.  Psychiatric:        Mood and Affect: Mood normal.        Behavior: Behavior normal.     ED Results / Procedures / Treatments   Labs (all labs ordered are listed, but only abnormal results are displayed) Labs Reviewed - No data to display  EKG None  Radiology No results found.  Procedures Procedures    Medications Ordered in ED Medications  naproxen (NAPROSYN) tablet 500 mg (has no administration in time range)    ED Course/ Medical Decision Making/ A&P                                 Medical Decision Making Risk Prescription drug management.   Patient is a 77 year old female with history of diabetes, hypertension, stroke, GERD who presents the emergency department after a fall.  Patient is very well-known to this department, frequently visits ER for similar symptoms.  Most recently seen her last night.  She states when she was leaving last night that she fell and injured her right hand and right elbow.  She did not hit her head or lose consciousness. She is not on blood thinners.   On exam patient has abrasions on her right hand and her right elbow.  I cleaned and dressed these appropriately.  She is requesting pain medication, will give her a tablet of naproxen.   She does have documented history of allergy to ibuprofen, however she has tolerated naproxen in the past.  I reviewed previous providers notes, and she does have several prescriptions for NSAIDs sent to her pharmacy, she is also been given printed prescriptions before.  I made her aware of this, and asked her to go to the pharmacy and pick these up if she is requiring them.  Requested that  she follow-up with her PCP regarding her ongoing body aches.  No emergent etiology found for symptoms today.  Patient discharged in stable condition, and all questions answered.  Final Clinical Impression(s) / ED Diagnoses Final diagnoses:  Fall, initial encounter  Abrasion of right hand, initial encounter  Abrasion of right elbow, initial encounter  Body aches    Rx / DC Orders ED Discharge Orders     None      Portions of this report may have been transcribed using voice recognition software. Every effort was made to ensure accuracy; however, inadvertent computerized transcription errors may be present.    Jeanella Flattery 09/14/22 0701    [name], MD 09/14/22 [phone]

## 2022-09-14 NOTE — Discharge Instructions (Signed)
You are seen in the emergency department after a fall.  I gave you some pain medication, and I checked that you have several prescriptions sent to the [facility] on [address] that you can pick up.  We cleaned and dressed your wounds.

## 2022-09-14 NOTE — ED Triage Notes (Signed)
States she was coming to the ED and fell small abrasion to right 5th digit bleeding controled.

## 2022-09-20 ENCOUNTER — Other Ambulatory Visit: Payer: Self-pay

## 2022-09-20 ENCOUNTER — Emergency Department (HOSPITAL_COMMUNITY)
Admission: EM | Admit: 2022-09-20 | Discharge: 2022-09-20 | Disposition: A | Discharge status: Home / Self Care | Payer: 59 | Attending: Emergency Medicine | Admitting: Emergency Medicine

## 2022-09-20 ENCOUNTER — Encounter (HOSPITAL_COMMUNITY): Payer: Self-pay

## 2022-09-20 DIAGNOSIS — G8929 Other chronic pain: Secondary | ICD-10-CM | POA: Insufficient documentation

## 2022-09-20 DIAGNOSIS — M79641 Pain in right hand: Secondary | ICD-10-CM | POA: Diagnosis present

## 2022-09-20 DIAGNOSIS — M79642 Pain in left hand: Secondary | ICD-10-CM | POA: Diagnosis not present

## 2022-09-20 MED ORDER — NAPROXEN 250 MG PO TABS
500.0000 mg | ORAL_TABLET | Freq: Once | ORAL | Status: AC
  Administered 2022-09-20: 500 mg via ORAL
  Filled 2022-09-20: qty 2

## 2022-09-20 NOTE — ED Provider Notes (Signed)
  [facility]EMERGENCY DEPT [facility] Emergency Department Provider Note MRN:  [id number]  Arrival date & time: 09/20/22     Chief Complaint   Hand Pain   History of Present Illness   [name] is a 77 y.o. year-old female presents to the ED with chief complaint of chronic bilateral hand pain.  She is seen here frequently for the same.  This is her 65th visit in 6 months for the same.  She states that she is not able to get to her primary care doctor because of poor vision and the fact that she has to ride a bus.  She states that she needs something for her pain.  Prior visit show that she frequently gets naproxen.  She denies new complaints.  History provided by patient.   Review of Systems  Pertinent positive and negative review of systems noted in HPI.    Physical Exam   Vitals:   09/20/22 1459  BP: (!) 125/58  Pulse: 69  Resp: 17  Temp: 98.2 F (36.8 C)  SpO2: 100%    CONSTITUTIONAL:  non toxic-appearing, NAD NEURO:  Alert and oriented x 3, CN 3-12 grossly intact EYES:  eyes equal and reactive ENT/NECK:  Supple, no stridor  CARDIO:  normal rate, appears well-perfused  PULM:  No respiratory distress,  GI/GU:  non-distended,  MSK/SPINE:  No gross deformities, no edema, moves all extremities  SKIN:  no rash, atraumatic   *Additional and/or pertinent findings included in MDM below  Diagnostic and Interventional Summary    EKG Interpretation Date/Time:    Ventricular Rate:    PR Interval:    QRS Duration:    QT Interval:    QTC Calculation:   R Axis:      Text Interpretation:         Labs Reviewed - No data to display  No orders to display    Medications  naproxen (NAPROSYN) tablet 500 mg (has no administration in time range)     Procedures  /  Critical Care Procedures  ED Course and Medical Decision Making  I have reviewed the triage vital signs, the nursing notes, and pertinent available records from the EMR.  Social Determinants Affecting  Complexity of Care: Patient has no clinically significant social determinants affecting this chief complaint..   ED Course:    Medical Decision Making Risk Prescription drug management.         Consultants: I placed TOC consult to assist with home health/PCP needs.   Treatment and Plan: Emergency department workup does not suggest an emergent condition requiring admission or immediate intervention beyond  what has been performed at this time. The patient is safe for discharge and has  been instructed to return immediately for worsening symptoms, change in  symptoms or any other concerns    Final Clinical Impressions(s) / ED Diagnoses     ICD-10-CM   1. Other chronic pain  G89.29       ED Discharge Orders     None         Discharge Instructions Discussed with and Provided to Patient:     Discharge Instructions      Your pain from your fall would best be managed by a primary care doctor or pain clinic.  I recommend you call the clinic listed and make an appointment.       Roxy Horseman, PA-C 09/20/22 2240    Virgina Norfolk, DO 09/20/22 [phone]

## 2022-09-20 NOTE — ED Triage Notes (Signed)
Patient here for the same every week.  Complains of hand pain, wrist pain.  No acute distress noted and in ccollar that she wears all the time.

## 2022-09-20 NOTE — Discharge Instructions (Signed)
Your pain from your fall would best be managed by a primary care doctor or pain clinic.  I recommend you call the clinic listed and make an appointment.

## 2022-09-20 NOTE — ED Notes (Signed)
Patient transported to X-ray 

## 2022-09-27 ENCOUNTER — Emergency Department (HOSPITAL_COMMUNITY)
Admission: EM | Admit: 2022-09-27 | Discharge: 2022-09-27 | Disposition: A | Discharge status: Home / Self Care | Payer: 59 | Attending: Emergency Medicine | Admitting: Emergency Medicine

## 2022-09-27 ENCOUNTER — Other Ambulatory Visit: Payer: Self-pay

## 2022-09-27 ENCOUNTER — Encounter (HOSPITAL_COMMUNITY): Payer: Self-pay

## 2022-09-27 DIAGNOSIS — Z79899 Other long term (current) drug therapy: Secondary | ICD-10-CM | POA: Diagnosis not present

## 2022-09-27 DIAGNOSIS — Z7902 Long term (current) use of antithrombotics/antiplatelets: Secondary | ICD-10-CM | POA: Insufficient documentation

## 2022-09-27 DIAGNOSIS — Z7984 Long term (current) use of oral hypoglycemic drugs: Secondary | ICD-10-CM | POA: Insufficient documentation

## 2022-09-27 DIAGNOSIS — Z8673 Personal history of transient ischemic attack (TIA), and cerebral infarction without residual deficits: Secondary | ICD-10-CM

## 2022-09-27 DIAGNOSIS — R21 Rash and other nonspecific skin eruption: Secondary | ICD-10-CM | POA: Diagnosis present

## 2022-09-27 DIAGNOSIS — E1169 Type 2 diabetes mellitus with other specified complication: Secondary | ICD-10-CM

## 2022-09-27 MED ORDER — GABAPENTIN 600 MG PO TABS: 300.0000 mg | ORAL_TABLET | Freq: Three times a day (TID) | ORAL | 0 refills | Status: DC

## 2022-09-27 MED ORDER — TRIAMCINOLONE ACETONIDE 0.1 % EX CREA: 1.0000 | TOPICAL_CREAM | Freq: Two times a day (BID) | CUTANEOUS | 0 refills | Status: DC

## 2022-09-27 MED ORDER — ROSUVASTATIN CALCIUM 10 MG PO TABS
10.0000 mg | ORAL_TABLET | Freq: Every day | ORAL | 3 refills | Status: DC
Start: 2022-09-27 — End: 2023-03-01

## 2022-09-27 MED ORDER — AMLODIPINE BESYLATE 10 MG PO TABS: 10.0000 mg | ORAL_TABLET | Freq: Every day | ORAL | 3 refills | Status: DC

## 2022-09-27 NOTE — Discharge Instructions (Signed)
Follow-up with dermatology. Take the triamcinolone for your rash.  Return to the emergency department for any concerning symptoms.

## 2022-09-27 NOTE — ED Provider Notes (Signed)
[place] EMERGENCY DEPARTMENT AT [facility] Provider Note   CSN: [id number] Arrival date & time: 09/27/22  1253     History  Chief Complaint  Patient presents with   Rash    [name] is a 77 y.o. female.  77 year old female who presents to the emergency department with rash.  Says that she has had 1 week of a rash on her neck and shoulders.  Denies rash elsewhere.  No fevers.  No new detergents.  Does not believe she has bedbugs.  Unsure if it is a medication side effect.  Says it is painful and itches.  Does have frequent visits to the emergency department.       Home Medications Prior to Admission medications   Medication Sig Start Date End Date Taking? Authorizing Provider  triamcinolone cream (KENALOG) 0.1 % Apply 1 Application topically 2 (two) times daily. Apply to rash 09/27/22  Yes Rondel Baton, MD  acetaminophen (TYLENOL) 500 MG tablet Take 1 tablet (500 mg total) by mouth every 6 (six) hours as needed. 06/28/22   Glyn Ade, MD  albuterol (VENTOLIN HFA) 108 (90 Base) MCG/ACT inhaler Inhale 1-2 puffs into the lungs every 6 (six) hours as needed for wheezing or shortness of breath. 04/22/20   Barbette Merino, NP  amLODipine (NORVASC) 10 MG tablet Take 1 tablet (10 mg total) by mouth daily. 09/27/22   Rondel Baton, MD  chlorthalidone (HYGROTON) 25 MG tablet TAKE 1 TABLET (25 MG TOTAL) BY MOUTH DAILY. 11/20/21 11/20/22  Donell Beers, FNP  clopidogrel (PLAVIX) 75 MG tablet Take 1 tablet (75 mg total) by mouth daily. 06/15/21   Elige Radon, MD  cyclobenzaprine (FLEXERIL) 10 MG tablet Take 1 tablet (10 mg total) by mouth 2 (two) times daily as needed for muscle spasms. 08/09/22   Carroll Sage, PA-C  diclofenac Sodium (VOLTAREN) 1 % GEL Apply 2 g topically 4 (four) times daily. 05/22/22   [name], PA-C  diclofenac Sodium (VOLTAREN) 1 % GEL Apply 2 g topically 4 (four) times daily. 07/28/22   Schutt, Edsel Petrin, PA-C  gabapentin  (NEURONTIN) 600 MG tablet Take 0.5 tablets (300 mg total) by mouth 3 (three) times daily. 09/27/22 10/27/22  Rondel Baton, MD  lidocaine (LIDODERM) 5 % Place 1 patch onto the skin daily. Remove & Discard patch within 12 hours or as directed by MD 07/15/22   Linwood Dibbles, MD  lidocaine (LIDODERM) 5 % Place 1 patch onto the skin daily. Remove & Discard patch within 12 hours or as directed by MD 09/06/22   Darrick Grinder, PA-C  losartan (COZAAR) 50 MG tablet Take 1 tablet (50 mg total) by mouth daily. 04/20/22 05/20/22  Roemhildt, Lorin T, PA-C  metFORMIN (GLUCOPHAGE) 500 MG tablet Take 1 tablet (500 mg total) by mouth 2 (two) times daily with a meal. 09/05/21 09/05/22  [name] A, DO  naproxen (NAPROSYN) 375 MG tablet Take 1 tablet (375 mg total) by mouth 2 (two) times daily as needed for moderate pain. 09/13/22   Tegeler, Canary Brim, MD  naproxen (NAPROSYN) 500 MG tablet Take 1 tablet (500 mg total) by mouth 2 (two) times daily. 08/28/22   Roemhildt, Lorin T, PA-C  naproxen (NAPROSYN) 500 MG tablet Take 1 tablet (500 mg total) by mouth 2 (two) times daily. 08/30/22   Tegeler, Canary Brim, MD  pantoprazole (PROTONIX) 20 MG tablet Take 1 tablet (20 mg total) by mouth daily. 10/10/21   [name], MD  rosuvastatin (CRESTOR) 10 MG tablet Take 1 tablet (10 mg total) by mouth at bedtime. 09/27/22 09/27/23  Rondel Baton, MD  traMADol (ULTRAM) 50 MG tablet Take 1 tablet (50 mg total) by mouth every 6 (six) hours as needed. 05/31/22   Dione Booze, MD      Allergies    Codeine, Ibuprofen, and Imdur [isosorbide nitrate]    Review of Systems   Review of Systems  Physical Exam Updated Vital Signs BP (!) 146/87 (BP Location: Right Arm)   Pulse 73   Temp 98 F (36.7 C) (Oral)   Resp 17   Ht 5\' 4"  (1.626 m)   Wt 49 kg   SpO2 98%   BMI 18.54 kg/m  Physical Exam Vitals and nursing note reviewed.  Constitutional:      General: She is not in acute distress.    Appearance: She is well-developed.   HENT:     Head: Normocephalic and atraumatic.     Right Ear: External ear normal.     Left Ear: External ear normal.     Nose: Nose normal.     Mouth/Throat:     Comments: No oral rash Eyes:     Extraocular Movements: Extraocular movements intact.     Conjunctiva/sclera: Conjunctivae normal.     Pupils: Pupils are equal, round, and reactive to light.  Neck:     Comments: Excoriations on posterior lower neck Cardiovascular:     Rate and Rhythm: Normal rate and regular rhythm.     Heart sounds: No murmur heard. Pulmonary:     Effort: Pulmonary effort is normal. No respiratory distress.     Breath sounds: Normal breath sounds.  Musculoskeletal:     Right lower leg: No edema.     Left lower leg: No edema.  Skin:    General: Skin is warm and dry.     Findings: Rash present.     Comments: Excoriations on shoulders bilaterally.  No rash on chest, upper or lower extremities otherwise.  No erythema or areas that look to be infected.  Neurological:     Mental Status: She is alert and oriented to person, place, and time. Mental status is at baseline.  Psychiatric:        Mood and Affect: Mood normal.     ED Results / Procedures / Treatments   Labs (all labs ordered are listed, but only abnormal results are displayed) Labs Reviewed - No data to display  EKG None  Radiology No results found.  Procedures Procedures    Medications Ordered in ED Medications - No data to display  ED Course/ Medical Decision Making/ A&P                                 Medical Decision Making Risk Prescription drug management.   [name] is a 77 y.o. female with comorbidities that complicate the patient evaluation including frequent visits to the emergency department who presents to the emergency department with itching on her neck and shoulders  Initial Ddx:  Contact dermatitis, drug eruption, scabies, bedbugs  MDM/Course:  Given the distribution of the rash it is unclear exactly  what is causing it.  I do suspect a contact dermatitis.  Not in a distribution that would be consistent with a drug eruption, scabies, or bedbugs.  Will prescribe her hydrocortisone cream for her itching and have her follow-up with dermatology.  Patient is also requesting  refill of several of her medications so we will send those to her pharmacy.  This patient presents to the ED for concern of complaints listed in HPI, this involves an extensive number of treatment options, and is a complaint that carries with it a high risk of complications and morbidity. Disposition including potential need for admission considered.   Dispo: DC Home. Return precautions discussed including, but not limited to, those listed in the AVS. Allowed pt time to ask questions which were answered fully prior to dc.  Records reviewed ED Visit Notes I have reviewed the patients home medications and made adjustments as needed Social Determinants of health:  Elderly         Final Clinical Impression(s) / ED Diagnoses Final diagnoses:  Rash    Rx / DC Orders ED Discharge Orders          Ordered    amLODipine (NORVASC) 10 MG tablet  Daily        09/27/22 1545    rosuvastatin (CRESTOR) 10 MG tablet  Daily at bedtime        09/27/22 1545    gabapentin (NEURONTIN) 600 MG tablet  3 times daily,   Status:  Discontinued        09/27/22 1545    triamcinolone cream (KENALOG) 0.1 %  2 times daily        09/27/22 1545    gabapentin (NEURONTIN) 600 MG tablet  3 times daily        09/27/22 1555              Rondel Baton, MD 09/28/22 1206

## 2022-09-27 NOTE — ED Triage Notes (Signed)
Patient complains of generalized body aches still since her fall years ago.  Also complains of rash to back of neck and arms. Visible rash to back of neck.

## 2022-09-27 NOTE — ED Notes (Signed)
Patient verbalizes understanding of discharge instructions. Opportunity for questioning and answers were provided. Armband removed by staff, pt discharged from ED. Pt ambulatory to ED waiting room with steady gait.  

## 2022-10-01 ENCOUNTER — Other Ambulatory Visit: Payer: Self-pay

## 2022-10-01 ENCOUNTER — Emergency Department (HOSPITAL_COMMUNITY)
Admission: EM | Admit: 2022-10-01 | Discharge: 2022-10-01 | Disposition: A | Discharge status: Home / Self Care | Payer: 59 | Attending: Emergency Medicine | Admitting: Emergency Medicine

## 2022-10-01 DIAGNOSIS — Z7902 Long term (current) use of antithrombotics/antiplatelets: Secondary | ICD-10-CM | POA: Insufficient documentation

## 2022-10-01 DIAGNOSIS — M79605 Pain in left leg: Secondary | ICD-10-CM

## 2022-10-01 DIAGNOSIS — M79661 Pain in right lower leg: Secondary | ICD-10-CM | POA: Diagnosis present

## 2022-10-01 DIAGNOSIS — M79662 Pain in left lower leg: Secondary | ICD-10-CM | POA: Diagnosis not present

## 2022-10-01 NOTE — ED Provider Triage Note (Signed)
Emergency Medicine Provider Triage Evaluation Note  [name] , a 77 y.o. female  was evaluated in triage.  Pt complains of continued pain from falling on the bus.  No new injury.  She is wearing a cervical collar for comfort.   Review of Systems  Positive: As above Negative: As above  Physical Exam  BP (!) 152/65 (BP Location: Right Arm)   Pulse 70   Temp 98.3 F (36.8 C) (Oral)   Resp 16   SpO2 100%  Gen:   Awake, no distress   Resp:  Normal effort  MSK:   Moves extremities without difficulty  Other:    Medical Decision Making  Medically screening exam initiated at 2:00 PM.  Appropriate orders placed.  [name] was informed that the remainder of the evaluation will be completed by another provider, this initial triage assessment does not replace that evaluation, and the importance of remaining in the ED until their evaluation is complete.     Melton Alar R, PA-C 10/01/22 1401

## 2022-10-01 NOTE — Discharge Instructions (Signed)
Everyone should have a family doctor.  There are certain things that they checked that we do not tend to be that helpful with out of the emergency department.  Please return to the emergency ferment anytime he would like to be re asssessed.
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
## 2022-10-03 ENCOUNTER — Other Ambulatory Visit: Payer: Self-pay

## 2022-10-03 ENCOUNTER — Encounter (HOSPITAL_COMMUNITY): Payer: Self-pay

## 2022-10-03 ENCOUNTER — Emergency Department (HOSPITAL_COMMUNITY)
Admission: EM | Admit: 2022-10-03 | Discharge: 2022-10-03 | Disposition: A | Discharge status: Home / Self Care | Payer: 59 | Attending: Emergency Medicine | Admitting: Emergency Medicine

## 2022-10-03 DIAGNOSIS — G8929 Other chronic pain: Secondary | ICD-10-CM | POA: Diagnosis not present

## 2022-10-03 DIAGNOSIS — W19XXXA Unspecified fall, initial encounter: Secondary | ICD-10-CM | POA: Insufficient documentation

## 2022-10-03 DIAGNOSIS — M79671 Pain in right foot: Secondary | ICD-10-CM | POA: Diagnosis present

## 2022-10-03 DIAGNOSIS — Y92811 Bus as the place of occurrence of the external cause: Secondary | ICD-10-CM | POA: Insufficient documentation

## 2022-10-03 DIAGNOSIS — E119 Type 2 diabetes mellitus without complications: Secondary | ICD-10-CM | POA: Diagnosis not present

## 2022-10-03 DIAGNOSIS — Z7984 Long term (current) use of oral hypoglycemic drugs: Secondary | ICD-10-CM | POA: Diagnosis not present

## 2022-10-03 DIAGNOSIS — I10 Essential (primary) hypertension: Secondary | ICD-10-CM | POA: Insufficient documentation

## 2022-10-03 DIAGNOSIS — Z79899 Other long term (current) drug therapy: Secondary | ICD-10-CM | POA: Insufficient documentation

## 2022-10-03 MED ORDER — DIPHENHYDRAMINE HCL 25 MG PO CAPS
25.0000 mg | ORAL_CAPSULE | Freq: Once | ORAL | Status: AC
  Administered 2022-10-03: 25 mg via ORAL
  Filled 2022-10-03: qty 1

## 2022-10-03 MED ORDER — NAPROXEN 250 MG PO TABS
500.0000 mg | ORAL_TABLET | Freq: Once | ORAL | Status: AC
  Administered 2022-10-03: 500 mg via ORAL
  Filled 2022-10-03: qty 2

## 2022-10-03 MED ORDER — ACETAMINOPHEN 500 MG PO TABS
1000.0000 mg | ORAL_TABLET | Freq: Once | ORAL | Status: AC
  Administered 2022-10-03: 1000 mg via ORAL
  Filled 2022-10-03: qty 2

## 2022-10-03 MED ORDER — DIPHENHYDRAMINE HCL 25 MG PO TABS: 25.0000 mg | ORAL_TABLET | Freq: Four times a day (QID) | ORAL | 0 refills | Status: DC | PRN
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
## 2022-10-03 NOTE — Discharge Instructions (Signed)
Please consider continue to take over-the-counter medication as needed for pain.  You can alternate between heat and ice to help with your discomfort.  You may take Benadryl as needed for any itchiness.  Follow-up closely with your doctor for further care.
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
## 2022-10-05 ENCOUNTER — Emergency Department (HOSPITAL_COMMUNITY)
Admission: EM | Admit: 2022-10-05 | Discharge: 2022-10-05 | Disposition: A | Discharge status: Home / Self Care | Payer: 59 | Attending: Emergency Medicine | Admitting: Emergency Medicine

## 2022-10-05 ENCOUNTER — Encounter (HOSPITAL_COMMUNITY): Payer: Self-pay

## 2022-10-05 ENCOUNTER — Other Ambulatory Visit: Payer: Self-pay

## 2022-10-05 DIAGNOSIS — G8929 Other chronic pain: Secondary | ICD-10-CM | POA: Insufficient documentation

## 2022-10-05 DIAGNOSIS — M791 Myalgia, unspecified site: Secondary | ICD-10-CM | POA: Diagnosis present

## 2022-10-05 MED ORDER — NAPROXEN 250 MG PO TABS
375.0000 mg | ORAL_TABLET | Freq: Once | ORAL | Status: AC
  Administered 2022-10-05: 375 mg via ORAL
  Filled 2022-10-05: qty 2

## 2022-10-05 NOTE — ED Triage Notes (Signed)
Pt c/o body pain all over and states tylenol doesn't help.
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
## 2022-10-08 ENCOUNTER — Other Ambulatory Visit: Payer: Self-pay

## 2022-10-08 ENCOUNTER — Emergency Department (HOSPITAL_COMMUNITY)
Admission: EM | Admit: 2022-10-08 | Discharge: 2022-10-09 | Disposition: A | Discharge status: Home / Self Care | Payer: 59 | Attending: Emergency Medicine | Admitting: Emergency Medicine

## 2022-10-08 ENCOUNTER — Encounter (HOSPITAL_COMMUNITY): Payer: Self-pay

## 2022-10-08 DIAGNOSIS — G8929 Other chronic pain: Secondary | ICD-10-CM

## 2022-10-08 DIAGNOSIS — M791 Myalgia, unspecified site: Secondary | ICD-10-CM | POA: Diagnosis not present

## 2022-10-08 DIAGNOSIS — Z7902 Long term (current) use of antithrombotics/antiplatelets: Secondary | ICD-10-CM | POA: Diagnosis not present

## 2022-10-08 DIAGNOSIS — Z79899 Other long term (current) drug therapy: Secondary | ICD-10-CM | POA: Insufficient documentation

## 2022-10-08 DIAGNOSIS — R52 Pain, unspecified: Secondary | ICD-10-CM | POA: Diagnosis present

## 2022-10-08 NOTE — ED Triage Notes (Signed)
Pt came in via POV d/t body aches since [place]." A/Ox4, rates her pain 10/10 & reports Tylenol doesn't help.

## 2022-10-09 DIAGNOSIS — M791 Myalgia, unspecified site: Secondary | ICD-10-CM | POA: Diagnosis not present

## 2022-10-09 MED ORDER — LIDOCAINE 5 % EX PTCH
3.0000 | MEDICATED_PATCH | CUTANEOUS | Status: DC
  Administered 2022-10-09: 3 via TRANSDERMAL
  Filled 2022-10-09: qty 3

## 2022-10-09 MED ORDER — NAPROXEN 250 MG PO TABS
500.0000 mg | ORAL_TABLET | Freq: Once | ORAL | Status: AC
  Administered 2022-10-09: 500 mg via ORAL
  Filled 2022-10-09: qty 2
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
## 2022-10-12 ENCOUNTER — Other Ambulatory Visit: Payer: Self-pay

## 2022-10-12 ENCOUNTER — Encounter (HOSPITAL_COMMUNITY): Payer: Self-pay

## 2022-10-12 ENCOUNTER — Emergency Department (HOSPITAL_COMMUNITY)
Admission: EM | Admit: 2022-10-12 | Discharge: 2022-10-12 | Disposition: A | Discharge status: Home / Self Care | Payer: 59 | Attending: Emergency Medicine | Admitting: Emergency Medicine

## 2022-10-12 DIAGNOSIS — Z79899 Other long term (current) drug therapy: Secondary | ICD-10-CM | POA: Diagnosis not present

## 2022-10-12 DIAGNOSIS — G8929 Other chronic pain: Secondary | ICD-10-CM | POA: Insufficient documentation

## 2022-10-12 DIAGNOSIS — Z7901 Long term (current) use of anticoagulants: Secondary | ICD-10-CM | POA: Insufficient documentation

## 2022-10-12 DIAGNOSIS — Z7984 Long term (current) use of oral hypoglycemic drugs: Secondary | ICD-10-CM | POA: Diagnosis not present

## 2022-10-12 DIAGNOSIS — I10 Essential (primary) hypertension: Secondary | ICD-10-CM | POA: Diagnosis not present

## 2022-10-12 DIAGNOSIS — E119 Type 2 diabetes mellitus without complications: Secondary | ICD-10-CM | POA: Diagnosis not present

## 2022-10-12 MED ORDER — NAPROXEN 250 MG PO TABS
500.0000 mg | ORAL_TABLET | Freq: Once | ORAL | Status: AC
  Administered 2022-10-12: 500 mg via ORAL
  Filled 2022-10-12: qty 2
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
## 2022-10-12 NOTE — Discharge Instructions (Signed)
Follow-up with a primary care doctor for further evaluation of your ongoing complaints.
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
## 2022-10-14 ENCOUNTER — Emergency Department (HOSPITAL_COMMUNITY)
Admission: EM | Admit: 2022-10-14 | Discharge: 2022-10-15 | Disposition: A | Discharge status: Home / Self Care | Payer: 59 | Attending: Emergency Medicine | Admitting: Emergency Medicine

## 2022-10-14 ENCOUNTER — Other Ambulatory Visit: Payer: Self-pay

## 2022-10-14 ENCOUNTER — Encounter (HOSPITAL_COMMUNITY): Payer: Self-pay | Admitting: *Deleted

## 2022-10-14 DIAGNOSIS — M25571 Pain in right ankle and joints of right foot: Secondary | ICD-10-CM | POA: Diagnosis not present

## 2022-10-14 DIAGNOSIS — M25552 Pain in left hip: Secondary | ICD-10-CM | POA: Diagnosis not present

## 2022-10-14 DIAGNOSIS — M25562 Pain in left knee: Secondary | ICD-10-CM | POA: Insufficient documentation

## 2022-10-14 DIAGNOSIS — M2550 Pain in unspecified joint: Secondary | ICD-10-CM

## 2022-10-14 DIAGNOSIS — M25511 Pain in right shoulder: Secondary | ICD-10-CM | POA: Insufficient documentation

## 2022-10-14 DIAGNOSIS — N189 Chronic kidney disease, unspecified: Secondary | ICD-10-CM | POA: Insufficient documentation

## 2022-10-14 DIAGNOSIS — M25551 Pain in right hip: Secondary | ICD-10-CM | POA: Insufficient documentation

## 2022-10-14 DIAGNOSIS — M25572 Pain in left ankle and joints of left foot: Secondary | ICD-10-CM | POA: Diagnosis not present

## 2022-10-14 DIAGNOSIS — M25561 Pain in right knee: Secondary | ICD-10-CM | POA: Diagnosis not present

## 2022-10-14 DIAGNOSIS — M25512 Pain in left shoulder: Secondary | ICD-10-CM | POA: Diagnosis not present
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
## 2022-10-15 DIAGNOSIS — M25511 Pain in right shoulder: Secondary | ICD-10-CM | POA: Diagnosis not present

## 2022-10-15 MED ORDER — NAPROXEN 250 MG PO TABS
500.0000 mg | ORAL_TABLET | Freq: Once | ORAL | Status: AC
  Administered 2022-10-15: 500 mg via ORAL
  Filled 2022-10-15: qty 2
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
## 2022-10-15 NOTE — Discharge Instructions (Signed)
Please follow-up outpatient with a primary care provider for long-term management of your joint pains.

## 2022-10-17 ENCOUNTER — Emergency Department (HOSPITAL_COMMUNITY)
Admission: EM | Admit: 2022-10-17 | Discharge: 2022-10-17 | Disposition: A | Discharge status: Home / Self Care | Payer: 59 | Attending: Emergency Medicine | Admitting: Emergency Medicine

## 2022-10-17 ENCOUNTER — Emergency Department (HOSPITAL_COMMUNITY): Payer: 59

## 2022-10-17 ENCOUNTER — Other Ambulatory Visit: Payer: Self-pay

## 2022-10-17 DIAGNOSIS — R21 Rash and other nonspecific skin eruption: Secondary | ICD-10-CM | POA: Diagnosis present

## 2022-10-17 DIAGNOSIS — M549 Dorsalgia, unspecified: Secondary | ICD-10-CM | POA: Diagnosis not present

## 2022-10-17 MED ORDER — LORATADINE 10 MG PO TABS: 10.0000 mg | ORAL_TABLET | Freq: Every day | ORAL | 0 refills | Status: DC

## 2022-10-17 MED ORDER — LORATADINE 10 MG PO TABS
10.0000 mg | ORAL_TABLET | Freq: Once | ORAL | Status: AC
  Administered 2022-10-17: 10 mg via ORAL
  Filled 2022-10-17: qty 1
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
## 2022-10-17 NOTE — ED Provider Notes (Signed)
[name] EMERGENCY DEPARTMENT AT [facility] Provider Note   CSN: [id number] Arrival date & time: 10/17/22  2130     History  Chief Complaint  Patient presents with   Back Pain   Rash    [name] is a 77 y.o. female.  Patient presents to the emergency department with irritation of her back.  She describes an itchy rash all over her back.  She has had to scratch her back against the wall for relief.  No treatments prior to arrival.  No fevers.  No falls or injuries.  Patient with frequent ED visits.       Home Medications Prior to Admission medications   Medication Sig Start Date End Date Taking? Authorizing Provider  loratadine (CLARITIN) 10 MG tablet Take 1 tablet (10 mg total) by mouth daily. 10/17/22  Yes Renne Crigler, PA-C  acetaminophen (TYLENOL) 500 MG tablet Take 1 tablet (500 mg total) by mouth every 6 (six) hours as needed. 06/28/22   Glyn Ade, MD  albuterol (VENTOLIN HFA) 108 (90 Base) MCG/ACT inhaler Inhale 1-2 puffs into the lungs every 6 (six) hours as needed for wheezing or shortness of breath. 04/22/20   Barbette Merino, NP  amLODipine (NORVASC) 10 MG tablet Take 1 tablet (10 mg total) by mouth daily. 09/27/22   Rondel Baton, MD  chlorthalidone (HYGROTON) 25 MG tablet TAKE 1 TABLET (25 MG TOTAL) BY MOUTH DAILY. 11/20/21 11/20/22  Donell Beers, FNP  clopidogrel (PLAVIX) 75 MG tablet Take 1 tablet (75 mg total) by mouth daily. 06/15/21   Elige Radon, MD  cyclobenzaprine (FLEXERIL) 10 MG tablet Take 1 tablet (10 mg total) by mouth 2 (two) times daily as needed for muscle spasms. 08/09/22   Carroll Sage, PA-C  diclofenac Sodium (VOLTAREN) 1 % GEL Apply 2 g topically 4 (four) times daily. 05/22/22   [name], PA-C  diclofenac Sodium (VOLTAREN) 1 % GEL Apply 2 g topically 4 (four) times daily. 07/28/22   Schutt, Edsel Petrin, PA-C  diphenhydrAMINE (BENADRYL) 25 MG tablet Take 1 tablet (25 mg total) by mouth every 6 (six) hours as  needed for itching or allergies. 10/03/22   Fayrene Helper, PA-C  gabapentin (NEURONTIN) 600 MG tablet Take 0.5 tablets (300 mg total) by mouth 3 (three) times daily. 09/27/22 10/27/22  Rondel Baton, MD  lidocaine (LIDODERM) 5 % Place 1 patch onto the skin daily. Remove & Discard patch within 12 hours or as directed by MD 07/15/22   Linwood Dibbles, MD  lidocaine (LIDODERM) 5 % Place 1 patch onto the skin daily. Remove & Discard patch within 12 hours or as directed by MD 09/06/22   Darrick Grinder, PA-C  losartan (COZAAR) 50 MG tablet Take 1 tablet (50 mg total) by mouth daily. 04/20/22 05/20/22  Roemhildt, Lorin T, PA-C  metFORMIN (GLUCOPHAGE) 500 MG tablet Take 1 tablet (500 mg total) by mouth 2 (two) times daily with a meal. 09/05/21 09/05/22  [name] A, DO  naproxen (NAPROSYN) 375 MG tablet Take 1 tablet (375 mg total) by mouth 2 (two) times daily as needed for moderate pain. 09/13/22   Tegeler, Canary Brim, MD  naproxen (NAPROSYN) 500 MG tablet Take 1 tablet (500 mg total) by mouth 2 (two) times daily. 08/28/22   Roemhildt, Lorin T, PA-C  naproxen (NAPROSYN) 500 MG tablet Take 1 tablet (500 mg total) by mouth 2 (two) times daily. 08/30/22   Tegeler, Canary Brim, MD  pantoprazole (PROTONIX) 20 MG tablet  Take 1 tablet (20 mg total) by mouth daily. 10/10/21   [name], MD  rosuvastatin (CRESTOR) 10 MG tablet Take 1 tablet (10 mg total) by mouth at bedtime. 09/27/22 09/27/23  Rondel Baton, MD  traMADol (ULTRAM) 50 MG tablet Take 1 tablet (50 mg total) by mouth every 6 (six) hours as needed. 05/31/22   Dione Booze, MD  triamcinolone cream (KENALOG) 0.1 % Apply 1 Application topically 2 (two) times daily. Apply to rash 09/27/22   Rondel Baton, MD      Allergies    Codeine, Ibuprofen, and Imdur [isosorbide nitrate]    Review of Systems   Review of Systems  Physical Exam Updated Vital Signs BP (!) 157/70 (BP Location: Right Arm)   Pulse 65   Temp 98.4 F (36.9 C) (Oral)   Resp 18   SpO2  100%  Physical Exam Vitals and nursing note reviewed.  Constitutional:      Appearance: She is well-developed.  HENT:     Head: Normocephalic and atraumatic.  Eyes:     Conjunctiva/sclera: Conjunctivae normal.  Pulmonary:     Effort: No respiratory distress.  Musculoskeletal:     Cervical back: Normal range of motion and neck supple.  Skin:    General: Skin is warm and dry.     Comments: Mild maculopapular rash noted with some excoriations of the mid lower back.  No signs of cellulitis or abscess.  No drainage.  Neurological:     Mental Status: She is alert.     ED Results / Procedures / Treatments   Labs (all labs ordered are listed, but only abnormal results are displayed) Labs Reviewed - No data to display  EKG None  Radiology No results found.  Procedures Procedures    Medications Ordered in ED Medications  loratadine (CLARITIN) tablet 10 mg (has no administration in time range)    ED Course/ Medical Decision Making/ A&P    Patient seen and examined. History obtained directly from patient.   Labs/EKG: None ordered  Imaging: None ordered  Medications/Fluids: Loratadine 10 mg  Most recent vital signs reviewed and are as follows: BP (!) 157/70 (BP Location: Right Arm)   Pulse 65   Temp 98.4 F (36.9 C) (Oral)   Resp 18   SpO2 100%   Initial impression: Rash, nonspecific,  Likely allergic in nature  Home treatment plan: Oral histamines.  Patient states that she will not be able to apply any topical medications due to location and lack of assistance at home.   Return instructions discussed with patient: New or worsening symptoms  Follow-up instructions discussed with patient: PCP in 1 week                                Medical Decision Making Risk OTC drugs.   Patient with nonspecific itchy rash on the lower back.  She will be given oral antihistamines.  This does not appear to be infectious.  No other musculoskeletal injury  suspected.        Final Clinical Impression(s) / ED Diagnoses Final diagnoses:  Rash    Rx / DC Orders ED Discharge Orders          Ordered    loratadine (CLARITIN) 10 MG tablet  Daily        10/17/22 2043              Renne Crigler, [place] 10/17/22 2047  Benjiman Core, MD 10/18/22 [phone]

## 2022-10-18 ENCOUNTER — Emergency Department (HOSPITAL_COMMUNITY)
Admission: EM | Admit: 2022-10-18 | Discharge: 2022-10-19 | Disposition: A | Discharge status: Home / Self Care | Payer: 59 | Attending: Emergency Medicine | Admitting: Emergency Medicine

## 2022-10-18 ENCOUNTER — Other Ambulatory Visit: Payer: Self-pay

## 2022-10-18 DIAGNOSIS — E119 Type 2 diabetes mellitus without complications: Secondary | ICD-10-CM | POA: Diagnosis not present

## 2022-10-18 DIAGNOSIS — Z7984 Long term (current) use of oral hypoglycemic drugs: Secondary | ICD-10-CM | POA: Insufficient documentation

## 2022-10-18 DIAGNOSIS — I10 Essential (primary) hypertension: Secondary | ICD-10-CM | POA: Diagnosis not present

## 2022-10-18 DIAGNOSIS — Z79899 Other long term (current) drug therapy: Secondary | ICD-10-CM | POA: Diagnosis not present

## 2022-10-18 DIAGNOSIS — M791 Myalgia, unspecified site: Secondary | ICD-10-CM | POA: Insufficient documentation

## 2022-10-18 DIAGNOSIS — G8929 Other chronic pain: Secondary | ICD-10-CM | POA: Insufficient documentation

## 2022-10-18 DIAGNOSIS — Z7902 Long term (current) use of antithrombotics/antiplatelets: Secondary | ICD-10-CM | POA: Diagnosis not present

## 2022-10-18 NOTE — ED Notes (Signed)
Patient seen by EDP at triage cleared to go to waiting area.

## 2022-10-18 NOTE — ED Triage Notes (Signed)
Patient arrived with EMS from home reports chronic generalized body aches /legs pain for several months denies recent injury .

## 2022-10-18 NOTE — ED Provider Notes (Signed)
Patient came in by EMS.  She is clinically well in appearance.  Patient was seen yesterday.  She is alert and in no acute distress sitting up in the wheelchair.  Stable for waiting room.   Arby Barrette, MD 10/18/22 2232

## 2022-10-19 DIAGNOSIS — M791 Myalgia, unspecified site: Secondary | ICD-10-CM | POA: Diagnosis not present

## 2022-10-19 MED ORDER — DIPHENHYDRAMINE HCL 25 MG PO CAPS
25.0000 mg | ORAL_CAPSULE | Freq: Once | ORAL | Status: AC
  Administered 2022-10-19: 25 mg via ORAL
  Filled 2022-10-19: qty 1

## 2022-10-19 MED ORDER — NAPROXEN 250 MG PO TABS
250.0000 mg | ORAL_TABLET | Freq: Once | ORAL | Status: AC
  Administered 2022-10-19: 250 mg via ORAL
  Filled 2022-10-19: qty 1
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
## 2022-10-19 NOTE — Discharge Instructions (Addendum)
You were evaluated today for pain.  Your pain improved with Naprosyn.  I recommend establishing care with a primary care provider for further evaluation as needed.

## 2022-10-21 ENCOUNTER — Other Ambulatory Visit: Payer: Self-pay

## 2022-10-21 ENCOUNTER — Emergency Department (HOSPITAL_COMMUNITY)
Admission: EM | Admit: 2022-10-21 | Discharge: 2022-10-22 | Disposition: A | Discharge status: Home / Self Care | Payer: 59 | Attending: Emergency Medicine | Admitting: Emergency Medicine

## 2022-10-21 DIAGNOSIS — R21 Rash and other nonspecific skin eruption: Secondary | ICD-10-CM | POA: Insufficient documentation

## 2022-10-21 DIAGNOSIS — R55 Syncope and collapse: Secondary | ICD-10-CM | POA: Insufficient documentation

## 2022-10-21 DIAGNOSIS — E119 Type 2 diabetes mellitus without complications: Secondary | ICD-10-CM | POA: Diagnosis not present

## 2022-10-21 DIAGNOSIS — I10 Essential (primary) hypertension: Secondary | ICD-10-CM | POA: Diagnosis not present

## 2022-10-21 DIAGNOSIS — Z79899 Other long term (current) drug therapy: Secondary | ICD-10-CM | POA: Diagnosis not present

## 2022-10-21 DIAGNOSIS — Z7984 Long term (current) use of oral hypoglycemic drugs: Secondary | ICD-10-CM | POA: Insufficient documentation

## 2022-10-21 DIAGNOSIS — Z7901 Long term (current) use of anticoagulants: Secondary | ICD-10-CM | POA: Diagnosis not present

## 2022-10-21 LAB — CBC
HCT: 33.8 % — ABNORMAL LOW (ref 36.0–46.0)
Hemoglobin: 11 g/dL — ABNORMAL LOW (ref 12.0–15.0)
MCH: 30 pg (ref 26.0–34.0)
MCHC: 32.5 g/dL (ref 30.0–36.0)
MCV: 92.1 fL (ref 80.0–100.0)
Platelets: 247 10*3/uL (ref 150–400)
RBC: 3.67 MIL/uL — ABNORMAL LOW (ref 3.87–5.11)
RDW: 12.3 % (ref 11.5–15.5)
WBC: 7.5 10*3/uL (ref 4.0–10.5)
nRBC: 0 % (ref 0.0–0.2)

## 2022-10-21 LAB — BASIC METABOLIC PANEL
Anion gap: 13 (ref 5–15)
BUN: 15 mg/dL (ref 8–23)
CO2: 22 mmol/L (ref 22–32)
Calcium: 8.8 mg/dL — ABNORMAL LOW (ref 8.9–10.3)
Chloride: 104 mmol/L (ref 98–111)
Creatinine, Ser: 1.31 mg/dL — ABNORMAL HIGH (ref 0.44–1.00)
GFR, Estimated: 42 mL/min — ABNORMAL LOW (ref >60)
Glucose, Bld: 133 mg/dL — ABNORMAL HIGH (ref 70–99)
Potassium: 4.4 mmol/L (ref 3.5–5.1)
Sodium: 139 mmol/L (ref 135–145)

## 2022-10-21 LAB — CBG MONITORING, ED: Glucose-Capillary: 134 mg/dL — ABNORMAL HIGH (ref 70–99)
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
## 2022-10-21 NOTE — ED Notes (Signed)
Pt ok'd to move to the lobby per charge RN

## 2022-10-22 DIAGNOSIS — R21 Rash and other nonspecific skin eruption: Secondary | ICD-10-CM | POA: Diagnosis not present

## 2022-10-22 MED ORDER — HYDROCORTISONE 1 % EX CREA
TOPICAL_CREAM | Freq: Two times a day (BID) | CUTANEOUS | Status: DC
  Filled 2022-10-22: qty 28

## 2022-10-22 MED ORDER — HYDROCORTISONE 1 % EX CREA: TOPICAL_CREAM | CUTANEOUS | 0 refills | Status: DC

## 2022-10-22 NOTE — Discharge Instructions (Signed)
Can continue using cream as needed. Gentle soap such as dove, ivory, etc. Follow-up with your doctor. Return here for new concerns.

## 2022-10-22 NOTE — ED Provider Notes (Signed)
[place] EMERGENCY DEPARTMENT AT [facility] Provider Note   CSN: [id number] Arrival date & time: 10/21/22  1645     History  Chief Complaint  Patient presents with   Near Syncope    [name] is a 77 y.o. female.  The history is provided by the patient and medical records.  Near Syncope   78 year old female with history of hypertension, chronic headaches, diabetes, arthritis, cognitive impairment, presenting to the ED for rash.  Triage note indicates she reported a near syncopal event, however tells me she is here because she has an itchy rash on her arms and back.  She is requesting someone to put medication on her back as she cannot reach it.  She denies any new medications, soaps, detergents, or other personal care products.  Rash is pruritic.  She denies any fever or chills.  Home Medications Prior to Admission medications   Medication Sig Start Date End Date Taking? Authorizing Provider  hydrocortisone cream 1 % Apply to affected area 2 times daily 10/22/22  Yes Garlon Hatchet, PA-C  acetaminophen (TYLENOL) 500 MG tablet Take 1 tablet (500 mg total) by mouth every 6 (six) hours as needed. 06/28/22   Glyn Ade, MD  albuterol (VENTOLIN HFA) 108 (90 Base) MCG/ACT inhaler Inhale 1-2 puffs into the lungs every 6 (six) hours as needed for wheezing or shortness of breath. 04/22/20   Barbette Merino, NP  amLODipine (NORVASC) 10 MG tablet Take 1 tablet (10 mg total) by mouth daily. 09/27/22   Rondel Baton, MD  chlorthalidone (HYGROTON) 25 MG tablet TAKE 1 TABLET (25 MG TOTAL) BY MOUTH DAILY. 11/20/21 11/20/22  Donell Beers, FNP  clopidogrel (PLAVIX) 75 MG tablet Take 1 tablet (75 mg total) by mouth daily. 06/15/21   Elige Radon, MD  cyclobenzaprine (FLEXERIL) 10 MG tablet Take 1 tablet (10 mg total) by mouth 2 (two) times daily as needed for muscle spasms. 08/09/22   Carroll Sage, PA-C  diclofenac Sodium (VOLTAREN) 1 % GEL Apply 2 g topically 4  (four) times daily. 05/22/22   [name], PA-C  diclofenac Sodium (VOLTAREN) 1 % GEL Apply 2 g topically 4 (four) times daily. 07/28/22   Schutt, Edsel Petrin, PA-C  diphenhydrAMINE (BENADRYL) 25 MG tablet Take 1 tablet (25 mg total) by mouth every 6 (six) hours as needed for itching or allergies. 10/03/22   Fayrene Helper, PA-C  gabapentin (NEURONTIN) 600 MG tablet Take 0.5 tablets (300 mg total) by mouth 3 (three) times daily. 09/27/22 10/27/22  Rondel Baton, MD  lidocaine (LIDODERM) 5 % Place 1 patch onto the skin daily. Remove & Discard patch within 12 hours or as directed by MD 07/15/22   Linwood Dibbles, MD  lidocaine (LIDODERM) 5 % Place 1 patch onto the skin daily. Remove & Discard patch within 12 hours or as directed by MD 09/06/22   Darrick Grinder, PA-C  loratadine (CLARITIN) 10 MG tablet Take 1 tablet (10 mg total) by mouth daily. 10/17/22   Renne Crigler, PA-C  losartan (COZAAR) 50 MG tablet Take 1 tablet (50 mg total) by mouth daily. 04/20/22 05/20/22  Roemhildt, Lorin T, PA-C  metFORMIN (GLUCOPHAGE) 500 MG tablet Take 1 tablet (500 mg total) by mouth 2 (two) times daily with a meal. 09/05/21 09/05/22  [name] A, DO  naproxen (NAPROSYN) 375 MG tablet Take 1 tablet (375 mg total) by mouth 2 (two) times daily as needed for moderate pain. 09/13/22   Tegeler, Cristal Deer  J, MD  naproxen (NAPROSYN) 500 MG tablet Take 1 tablet (500 mg total) by mouth 2 (two) times daily. 08/28/22   Roemhildt, Lorin T, PA-C  naproxen (NAPROSYN) 500 MG tablet Take 1 tablet (500 mg total) by mouth 2 (two) times daily. 08/30/22   Tegeler, Canary Brim, MD  pantoprazole (PROTONIX) 20 MG tablet Take 1 tablet (20 mg total) by mouth daily. 10/10/21   [name], MD  rosuvastatin (CRESTOR) 10 MG tablet Take 1 tablet (10 mg total) by mouth at bedtime. 09/27/22 09/27/23  Rondel Baton, MD  traMADol (ULTRAM) 50 MG tablet Take 1 tablet (50 mg total) by mouth every 6 (six) hours as needed. 05/31/22   Dione Booze, MD  triamcinolone  cream (KENALOG) 0.1 % Apply 1 Application topically 2 (two) times daily. Apply to rash 09/27/22   Rondel Baton, MD      Allergies    Codeine, Ibuprofen, and Imdur [isosorbide nitrate]    Review of Systems   Review of Systems  Cardiovascular:  Positive for near-syncope.  Skin:  Positive for rash.  All other systems reviewed and are negative.   Physical Exam Updated Vital Signs BP (!) 144/72 (BP Location: Right Arm)   Pulse 63   Temp 97.8 F (36.6 C) (Oral)   Resp 16   Ht 5\' 4"  (1.626 m)   Wt 52.2 kg   SpO2 100%   BMI 19.75 kg/m   Physical Exam Vitals and nursing note reviewed.  Constitutional:      Appearance: She is well-developed.  HENT:     Head: Normocephalic and atraumatic.     Comments: No oral lesions Eyes:     Conjunctiva/sclera: Conjunctivae normal.     Pupils: Pupils are equal, round, and reactive to light.     Comments: Conjunctiva non-injected  Neck:     Comments: Wearing neckbrace (has been for months) Cardiovascular:     Rate and Rhythm: Normal rate and regular rhythm.     Heart sounds: Normal heart sounds.  Pulmonary:     Effort: Pulmonary effort is normal.     Breath sounds: Normal breath sounds.  Abdominal:     General: Bowel sounds are normal.     Palpations: Abdomen is soft.  Musculoskeletal:        General: Normal range of motion.  Skin:    General: Skin is warm and dry.     Comments: Excoriated bumps to back diffusely along with bilateral upper extremities, no signs of superimposed infection or cellulitis, no lesions on palms  Neurological:     Mental Status: She is alert and oriented to person, place, and time.     ED Results / Procedures / Treatments   Labs (all labs ordered are listed, but only abnormal results are displayed) Labs Reviewed  BASIC METABOLIC PANEL - Abnormal; Notable for the following components:      Result Value   Glucose, Bld 133 (*)    Creatinine, Ser 1.31 (*)    Calcium 8.8 (*)    GFR, Estimated 42 (*)     All other components within normal limits  CBC - Abnormal; Notable for the following components:   RBC 3.67 (*)    Hemoglobin 11.0 (*)    HCT 33.8 (*)    All other components within normal limits  CBG MONITORING, ED - Abnormal; Notable for the following components:   Glucose-Capillary 134 (*)    All other components within normal limits  URINALYSIS, ROUTINE W REFLEX MICROSCOPIC  CBG MONITORING, ED    EKG None  Radiology No results found.  Procedures Procedures    Medications Ordered in ED Medications  hydrocortisone cream 1 % ( Topical Given 10/22/22 0053)    ED Course/ Medical Decision Making/ A&P                                 Medical Decision Making Amount and/or Complexity of Data Reviewed Labs: ordered. ECG/medicine tests: ordered and independent interpretation performed.   77 year old female presenting to the ED with rash.  Triage note indicates near syncopal event, however patient states she is here because of a rash on her back and arms.  She is well-known to the ED, seen almost daily for various complaints.  She is afebrile and nontoxic in appearance.  She is wearing her c-collar as usual given though she has no known neck injuries.  She does have a rash noted across the back and bilateral upper extremities.  She does have excoriation without superimposed infection or cellulitis.  This is bilateral and is not painful, not consistent with shingles.  She does not appear to have any mucosal involvement.  No changes in soaps/detergents noted.  She did have labs obtained from triage which are grossly reassuring without leukocytosis or electrolyte derangement.  Her EKG is nonischemic.  Her vitals are stable here.  She was given hydrocortisone cream and encouraged to follow-up with PCP.  She can return here for new concerns.  Final Clinical Impression(s) / ED Diagnoses Final diagnoses:  Rash    Rx / DC Orders ED Discharge Orders          Ordered     hydrocortisone cream 1 %        10/22/22 0021              Garlon Hatchet, PA-C 10/22/22 0341    Nira Conn, MD 10/22/22 [phone]

## 2022-10-27 ENCOUNTER — Encounter (HOSPITAL_COMMUNITY): Payer: Self-pay

## 2022-10-27 ENCOUNTER — Other Ambulatory Visit: Payer: Self-pay

## 2022-10-27 ENCOUNTER — Emergency Department (HOSPITAL_COMMUNITY)
Admission: EM | Admit: 2022-10-27 | Discharge: 2022-10-27 | Disposition: A | Discharge status: Home / Self Care | Payer: 59 | Attending: Emergency Medicine | Admitting: Emergency Medicine

## 2022-10-27 DIAGNOSIS — Z7984 Long term (current) use of oral hypoglycemic drugs: Secondary | ICD-10-CM | POA: Insufficient documentation

## 2022-10-27 DIAGNOSIS — Z79899 Other long term (current) drug therapy: Secondary | ICD-10-CM | POA: Insufficient documentation

## 2022-10-27 DIAGNOSIS — G894 Chronic pain syndrome: Secondary | ICD-10-CM

## 2022-10-27 DIAGNOSIS — Z760 Encounter for issue of repeat prescription: Secondary | ICD-10-CM | POA: Diagnosis not present

## 2022-10-27 DIAGNOSIS — E1169 Type 2 diabetes mellitus with other specified complication: Secondary | ICD-10-CM

## 2022-10-27 MED ORDER — GABAPENTIN 600 MG PO TABS: 300.0000 mg | ORAL_TABLET | Freq: Three times a day (TID) | ORAL | 1 refills | Status: DC

## 2022-10-27 MED ORDER — CYCLOBENZAPRINE HCL 10 MG PO TABS: 10.0000 mg | ORAL_TABLET | Freq: Two times a day (BID) | ORAL | 1 refills | Status: DC | PRN

## 2022-10-27 MED ORDER — NAPROXEN 375 MG PO TABS: 375.0000 mg | ORAL_TABLET | Freq: Two times a day (BID) | ORAL | 1 refills | Status: DC | PRN

## 2022-10-27 MED ORDER — METFORMIN HCL 500 MG PO TABS: 500.0000 mg | ORAL_TABLET | Freq: Two times a day (BID) | ORAL | 3 refills | Status: DC

## 2022-10-27 NOTE — ED Triage Notes (Signed)
Pt c.o all over body pain from a fall that happened earlier this year

## 2022-10-27 NOTE — ED Provider Notes (Signed)
[place] EMERGENCY DEPARTMENT AT [facility] Provider Note   CSN: [id number] Arrival date & time: 10/27/22  1420     History  Chief Complaint  Patient presents with   Pain    [name] is a 77 y.o. female.  HPI 76 year old female presents requesting medication refill for metformin, Flexeril, naproxen, and gabapentin.  She also is dealing with chronic pain all over her body from a fall many months ago.  No new falls.  No new areas of pain, complaining of pain all over including her neck.  No fevers, cough, chest pain  Home Medications Prior to Admission medications   Medication Sig Start Date End Date Taking? Authorizing Provider  acetaminophen (TYLENOL) 500 MG tablet Take 1 tablet (500 mg total) by mouth every 6 (six) hours as needed. 06/28/22   Glyn Ade, MD  albuterol (VENTOLIN HFA) 108 (90 Base) MCG/ACT inhaler Inhale 1-2 puffs into the lungs every 6 (six) hours as needed for wheezing or shortness of breath. 04/22/20   Barbette Merino, NP  amLODipine (NORVASC) 10 MG tablet Take 1 tablet (10 mg total) by mouth daily. 09/27/22   Rondel Baton, MD  chlorthalidone (HYGROTON) 25 MG tablet TAKE 1 TABLET (25 MG TOTAL) BY MOUTH DAILY. 11/20/21 11/20/22  Donell Beers, FNP  clopidogrel (PLAVIX) 75 MG tablet Take 1 tablet (75 mg total) by mouth daily. 06/15/21   Elige Radon, MD  cyclobenzaprine (FLEXERIL) 10 MG tablet Take 1 tablet (10 mg total) by mouth 2 (two) times daily as needed for muscle spasms. 10/27/22   Pricilla Loveless, MD  diclofenac Sodium (VOLTAREN) 1 % GEL Apply 2 g topically 4 (four) times daily. 05/22/22   [name], PA-C  diclofenac Sodium (VOLTAREN) 1 % GEL Apply 2 g topically 4 (four) times daily. 07/28/22   Schutt, Edsel Petrin, PA-C  diphenhydrAMINE (BENADRYL) 25 MG tablet Take 1 tablet (25 mg total) by mouth every 6 (six) hours as needed for itching or allergies. 10/03/22   Fayrene Helper, PA-C  gabapentin (NEURONTIN) 600 MG tablet Take 0.5  tablets (300 mg total) by mouth 3 (three) times daily. 10/27/22 12/26/22  Pricilla Loveless, MD  hydrocortisone cream 1 % Apply to affected area 2 times daily 10/22/22   Garlon Hatchet, PA-C  lidocaine (LIDODERM) 5 % Place 1 patch onto the skin daily. Remove & Discard patch within 12 hours or as directed by MD 07/15/22   Linwood Dibbles, MD  lidocaine (LIDODERM) 5 % Place 1 patch onto the skin daily. Remove & Discard patch within 12 hours or as directed by MD 09/06/22   Darrick Grinder, PA-C  loratadine (CLARITIN) 10 MG tablet Take 1 tablet (10 mg total) by mouth daily. 10/17/22   Renne Crigler, PA-C  losartan (COZAAR) 50 MG tablet Take 1 tablet (50 mg total) by mouth daily. 04/20/22 05/20/22  Roemhildt, Lorin T, PA-C  metFORMIN (GLUCOPHAGE) 500 MG tablet Take 1 tablet (500 mg total) by mouth 2 (two) times daily with a meal. 10/27/22 10/27/23  Pricilla Loveless, MD  naproxen (NAPROSYN) 375 MG tablet Take 1 tablet (375 mg total) by mouth 2 (two) times daily as needed for moderate pain. 10/27/22   Pricilla Loveless, MD  pantoprazole (PROTONIX) 20 MG tablet Take 1 tablet (20 mg total) by mouth daily. 10/10/21   [name], MD  rosuvastatin (CRESTOR) 10 MG tablet Take 1 tablet (10 mg total) by mouth at bedtime. 09/27/22 09/27/23  Rondel Baton, MD  traMADol Janean Sark)  50 MG tablet Take 1 tablet (50 mg total) by mouth every 6 (six) hours as needed. 05/31/22   Dione Booze, MD  triamcinolone cream (KENALOG) 0.1 % Apply 1 Application topically 2 (two) times daily. Apply to rash 09/27/22   Rondel Baton, MD      Allergies    Codeine, Ibuprofen, and Imdur [isosorbide nitrate]    Review of Systems   Review of Systems  Constitutional:  Negative for fever.  Respiratory:  Negative for cough.   Cardiovascular:  Negative for chest pain.  Musculoskeletal:  Positive for arthralgias, myalgias and neck pain. Negative for gait problem.  Neurological:  Negative for weakness.    Physical Exam Updated Vital Signs BP (!)  151/85 (BP Location: Right Arm)   Pulse 67   Temp 98 F (36.7 C) (Oral)   Resp 15   SpO2 99%  Physical Exam Vitals and nursing note reviewed.  Constitutional:      Appearance: She is well-developed.  HENT:     Head: Normocephalic and atraumatic.  Neck:     Comments: Currently wearing a c-collar Cardiovascular:     Rate and Rhythm: Normal rate and regular rhythm.     Heart sounds: Normal heart sounds.  Pulmonary:     Effort: Pulmonary effort is normal.     Breath sounds: Normal breath sounds.  Abdominal:     Palpations: Abdomen is soft.     Tenderness: There is no abdominal tenderness.  Musculoskeletal:     Comments: No focal tenderness  Skin:    General: Skin is warm and dry.  Neurological:     Mental Status: She is alert.     Comments: Awake, alert, equal strength in all 4 extremities.  Normal gait     ED Results / Procedures / Treatments   Labs (all labs ordered are listed, but only abnormal results are displayed) Labs Reviewed - No data to display  EKG None  Radiology No results found.  Procedures Procedures    Medications Ordered in ED Medications - No data to display  ED Course/ Medical Decision Making/ A&P                                 Medical Decision Making Risk Prescription drug management.   Patient's chart has been reviewed and she is here often for chronic pain.  She is also had previous refills through the ED.  Discussed would be best to go to a PCP and will refer to the [facility] health and wellness center.  Otherwise, appears to have chronic pain.  Vitals are okay besides some hypertension.  Highly doubt acute emergent condition.  Will discharge.        Final Clinical Impression(s) / ED Diagnoses Final diagnoses:  Chronic pain syndrome  Medication refill    Rx / DC Orders ED Discharge Orders          Ordered    cyclobenzaprine (FLEXERIL) 10 MG tablet  2 times daily PRN        10/27/22 1526    gabapentin (NEURONTIN) 600 MG  tablet  3 times daily        10/27/22 1526    metFORMIN (GLUCOPHAGE) 500 MG tablet  2 times daily with meals        10/27/22 1526    naproxen (NAPROSYN) 375 MG tablet  2 times daily PRN        10/27/22 1526  Pricilla Loveless, MD 10/27/22 [phone]

## 2022-10-27 NOTE — Discharge Instructions (Addendum)
Follow-up with the [facility] health and wellness center for further refills as well as primary care.

## 2022-10-31 ENCOUNTER — Emergency Department (HOSPITAL_COMMUNITY)
Admission: EM | Admit: 2022-10-31 | Discharge: 2022-11-01 | Disposition: A | Discharge status: Home / Self Care | Payer: 59 | Attending: Emergency Medicine | Admitting: Emergency Medicine

## 2022-10-31 ENCOUNTER — Encounter (HOSPITAL_COMMUNITY): Payer: Self-pay | Admitting: Emergency Medicine

## 2022-10-31 DIAGNOSIS — M791 Myalgia, unspecified site: Secondary | ICD-10-CM | POA: Insufficient documentation

## 2022-10-31 DIAGNOSIS — Z79899 Other long term (current) drug therapy: Secondary | ICD-10-CM | POA: Insufficient documentation

## 2022-10-31 DIAGNOSIS — Z7902 Long term (current) use of antithrombotics/antiplatelets: Secondary | ICD-10-CM | POA: Diagnosis not present

## 2022-10-31 DIAGNOSIS — Z7984 Long term (current) use of oral hypoglycemic drugs: Secondary | ICD-10-CM | POA: Insufficient documentation

## 2022-10-31 DIAGNOSIS — Z8673 Personal history of transient ischemic attack (TIA), and cerebral infarction without residual deficits: Secondary | ICD-10-CM | POA: Insufficient documentation

## 2022-10-31 DIAGNOSIS — M542 Cervicalgia: Secondary | ICD-10-CM | POA: Diagnosis present

## 2022-10-31 DIAGNOSIS — G8929 Other chronic pain: Secondary | ICD-10-CM | POA: Diagnosis not present

## 2022-10-31 DIAGNOSIS — E119 Type 2 diabetes mellitus without complications: Secondary | ICD-10-CM | POA: Insufficient documentation

## 2022-10-31 DIAGNOSIS — I10 Essential (primary) hypertension: Secondary | ICD-10-CM | POA: Insufficient documentation

## 2022-11-01 ENCOUNTER — Encounter (HOSPITAL_COMMUNITY): Payer: Self-pay

## 2022-11-01 ENCOUNTER — Emergency Department (HOSPITAL_COMMUNITY)
Admission: EM | Admit: 2022-11-01 | Discharge: 2022-11-01 | Disposition: A | Discharge status: Home / Self Care | Payer: 59 | Source: Home / Self Care | Attending: Emergency Medicine | Admitting: Emergency Medicine

## 2022-11-01 ENCOUNTER — Other Ambulatory Visit: Payer: Self-pay

## 2022-11-01 DIAGNOSIS — G8929 Other chronic pain: Secondary | ICD-10-CM | POA: Insufficient documentation

## 2022-11-01 DIAGNOSIS — M542 Cervicalgia: Secondary | ICD-10-CM | POA: Diagnosis not present

## 2022-11-01 DIAGNOSIS — Z79899 Other long term (current) drug therapy: Secondary | ICD-10-CM | POA: Insufficient documentation

## 2022-11-01 DIAGNOSIS — M791 Myalgia, unspecified site: Secondary | ICD-10-CM | POA: Insufficient documentation

## 2022-11-01 DIAGNOSIS — Z8673 Personal history of transient ischemic attack (TIA), and cerebral infarction without residual deficits: Secondary | ICD-10-CM | POA: Insufficient documentation

## 2022-11-01 DIAGNOSIS — Z7984 Long term (current) use of oral hypoglycemic drugs: Secondary | ICD-10-CM | POA: Insufficient documentation

## 2022-11-01 DIAGNOSIS — E119 Type 2 diabetes mellitus without complications: Secondary | ICD-10-CM | POA: Insufficient documentation

## 2022-11-01 DIAGNOSIS — Z7902 Long term (current) use of antithrombotics/antiplatelets: Secondary | ICD-10-CM | POA: Insufficient documentation

## 2022-11-01 DIAGNOSIS — I10 Essential (primary) hypertension: Secondary | ICD-10-CM | POA: Insufficient documentation

## 2022-11-01 MED ORDER — NAPROXEN 250 MG PO TABS
250.0000 mg | ORAL_TABLET | Freq: Once | ORAL | Status: AC
  Administered 2022-11-01: 250 mg via ORAL
  Filled 2022-11-01: qty 1

## 2022-11-01 MED ORDER — LIDOCAINE 5 % EX PTCH
1.0000 | MEDICATED_PATCH | CUTANEOUS | Status: DC
  Administered 2022-11-01: 1 via TRANSDERMAL
  Filled 2022-11-01: qty 1

## 2022-11-01 MED ORDER — LIDOCAINE 5 % EX PTCH: 1.0000 | MEDICATED_PATCH | Freq: Every day | CUTANEOUS | 1 refills | Status: DC | PRN

## 2022-11-01 NOTE — Discharge Instructions (Signed)
Take your medications as prescribed.  Follow up with [facility] to establish care with a primary care provider.
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
## 2022-11-01 NOTE — ED Notes (Signed)
Patient verbalizes understanding of discharge instructions. Opportunity for questioning and answers were provided. Pt discharged from ED. 

## 2022-11-01 NOTE — Discharge Instructions (Addendum)
It was a pleasure caring for you today in the emergency department.  Please return to the emergency department for any worsening or worrisome symptoms.   Please take medications that were prescribed to you at prior visit

## 2022-11-01 NOTE — ED Provider Notes (Signed)
[place] EMERGENCY DEPARTMENT AT [facility] Provider Note  CSN: [id number] Arrival date & time: 11/01/22 1321  Chief Complaint(s) Pain Management  HPI [name] is a 77 y.o. female with past medical history as below, significant for DM, GERD, hypertension, CVA, cognitive impairment who presents to the ED with complaint of bodyaches , chronic pain  Patient well-known to this facility, she was last seen earlier this morning with similar complaint.  Patient has chronic body aches from a fall she suffered many years ago.  Does not follow with primary care prefers to follow with the emergency department for her medical needs.  She wears a cervical collar intermittently when she has discomfort.  She is here today requesting pain medication.  No repeat injuries, no falls.  No numbness or tingling to extremities.  No fevers.  Past Medical History Past Medical History:  Diagnosis Date   Diabetes mellitus without complication (HCC)    GERD 09/04/2006   Qualifier: Diagnosis of  By: Duke Salvia     History of echocardiogram    a. 2D ECHO: 11/06/2013 EF 65%; no WMA. Mild TR. PA pk pressure 43 mm HG   Hypertension    Stroke [facility])    Patient Active Problem List   Diagnosis Date Noted   Cognitive impairment 06/16/2021   Chronic headache 06/16/2021   Healthcare maintenance 06/16/2021   Frequent patient in emergency department 06/16/2021   Cervical stenosis of spine 11/10/2019   Type 2 diabetes mellitus with hyperlipidemia (HCC) 06/20/2012   History of stroke 06/20/2012   Essential hypertension 09/04/2006   Osteoarthritis 09/04/2006   Home Medication(s) Prior to Admission medications   Medication Sig Start Date End Date Taking? Authorizing Provider  lidocaine (LIDODERM) 5 % Place 1 patch onto the skin daily as needed. Remove & Discard patch within 12 hours or as directed by MD 11/01/22  Yes Sloan Leiter, DO  acetaminophen (TYLENOL) 500 MG tablet Take 1 tablet (500 mg total)  by mouth every 6 (six) hours as needed. 06/28/22   Glyn Ade, MD  albuterol (VENTOLIN HFA) 108 (90 Base) MCG/ACT inhaler Inhale 1-2 puffs into the lungs every 6 (six) hours as needed for wheezing or shortness of breath. 04/22/20   Barbette Merino, NP  amLODipine (NORVASC) 10 MG tablet Take 1 tablet (10 mg total) by mouth daily. 09/27/22   Rondel Baton, MD  chlorthalidone (HYGROTON) 25 MG tablet TAKE 1 TABLET (25 MG TOTAL) BY MOUTH DAILY. 11/20/21 11/20/22  Donell Beers, FNP  clopidogrel (PLAVIX) 75 MG tablet Take 1 tablet (75 mg total) by mouth daily. 06/15/21   Elige Radon, MD  cyclobenzaprine (FLEXERIL) 10 MG tablet Take 1 tablet (10 mg total) by mouth 2 (two) times daily as needed for muscle spasms. 10/27/22   Pricilla Loveless, MD  diclofenac Sodium (VOLTAREN) 1 % GEL Apply 2 g topically 4 (four) times daily. 05/22/22   [name], PA-C  diclofenac Sodium (VOLTAREN) 1 % GEL Apply 2 g topically 4 (four) times daily. 07/28/22   Schutt, Edsel Petrin, PA-C  diphenhydrAMINE (BENADRYL) 25 MG tablet Take 1 tablet (25 mg total) by mouth every 6 (six) hours as needed for itching or allergies. 10/03/22   Fayrene Helper, PA-C  gabapentin (NEURONTIN) 600 MG tablet Take 0.5 tablets (300 mg total) by mouth 3 (three) times daily. 10/27/22 12/26/22  Pricilla Loveless, MD  hydrocortisone cream 1 % Apply to affected area 2 times daily 10/22/22   Garlon Hatchet, PA-C  loratadine [facility])  10 MG tablet Take 1 tablet (10 mg total) by mouth daily. 10/17/22   Renne Crigler, PA-C  losartan (COZAAR) 50 MG tablet Take 1 tablet (50 mg total) by mouth daily. 04/20/22 05/20/22  Roemhildt, Lorin T, PA-C  metFORMIN (GLUCOPHAGE) 500 MG tablet Take 1 tablet (500 mg total) by mouth 2 (two) times daily with a meal. 10/27/22 10/27/23  Pricilla Loveless, MD  naproxen (NAPROSYN) 375 MG tablet Take 1 tablet (375 mg total) by mouth 2 (two) times daily as needed for moderate pain. 10/27/22   Pricilla Loveless, MD  pantoprazole (PROTONIX) 20  MG tablet Take 1 tablet (20 mg total) by mouth daily. 10/10/21   [name], MD  rosuvastatin (CRESTOR) 10 MG tablet Take 1 tablet (10 mg total) by mouth at bedtime. 09/27/22 09/27/23  Rondel Baton, MD  traMADol (ULTRAM) 50 MG tablet Take 1 tablet (50 mg total) by mouth every 6 (six) hours as needed. 05/31/22   Dione Booze, MD  triamcinolone cream (KENALOG) 0.1 % Apply 1 Application topically 2 (two) times daily. Apply to rash 09/27/22   Rondel Baton, MD                                                                                                                                    Past Surgical History Past Surgical History:  Procedure Laterality Date   ABDOMINAL HYSTERECTOMY     ANTERIOR CERVICAL DECOMPRESSION/DISCECTOMY FUSION 4 LEVELS N/A 11/11/2019   Procedure: Cervical three-four Cervical four-five Cervical five-six Cervical six-seven  Anterior cervical decompression/discectomy/fusion;  Surgeon: Maeola Harman, MD;  Location: [facility] OR;  Service: Neurosurgery;  Laterality: N/A;   LEFT HEART CATHETERIZATION WITH CORONARY ANGIOGRAM N/A 11/05/2013   Procedure: LEFT HEART CATHETERIZATION WITH CORONARY ANGIOGRAM;  Surgeon: Lennette Bihari, MD;  Location: [facility] CATH LAB;  Service: Cardiovascular;  Laterality: N/A;   Family History History reviewed. No pertinent family history.  Social History Social History   Tobacco Use   Smoking status: Never   Smokeless tobacco: Never  Vaping Use   Vaping status: Never Used  Substance Use Topics   Alcohol use: No   Drug use: No   Allergies Codeine, Ibuprofen, and Imdur [isosorbide nitrate]  Review of Systems Review of Systems  Constitutional:  Negative for chills and fever.  Respiratory:  Negative for chest tightness.   Cardiovascular:  Negative for chest pain.  Gastrointestinal:  Negative for abdominal pain and nausea.  Musculoskeletal:  Positive for arthralgias.  All other systems reviewed and are negative.   Physical Exam Vital Signs   I have reviewed the triage vital signs BP (!) 152/50   Pulse 61   Temp 97.9 F (36.6 C)   Resp 16   Ht 5\' 4"  (1.626 m)   Wt 52.2 kg   SpO2 100%   BMI 19.74 kg/m  Physical Exam Vitals and nursing note reviewed.  Constitutional:      General: She is  not in acute distress.    Appearance: Normal appearance. She is well-developed. She is not ill-appearing.  HENT:     Head: Normocephalic and atraumatic.     Right Ear: External ear normal.     Left Ear: External ear normal.     Nose: Nose normal.     Mouth/Throat:     Mouth: Mucous membranes are moist.  Eyes:     General: No scleral icterus.       Right eye: No discharge.        Left eye: No discharge.  Cardiovascular:     Rate and Rhythm: Normal rate.  Pulmonary:     Effort: Pulmonary effort is normal. No respiratory distress.     Breath sounds: No stridor.  Abdominal:     General: Abdomen is flat. There is no distension.     Tenderness: There is no guarding.  Musculoskeletal:        General: No deformity.     Cervical back: No rigidity.  Skin:    General: Skin is warm and dry.     Coloration: Skin is not cyanotic, jaundiced or pale.  Neurological:     Mental Status: She is alert and oriented to person, place, and time.     GCS: GCS eye subscore is 4. GCS verbal subscore is 5. GCS motor subscore is 6.  Psychiatric:        Speech: Speech normal.        Behavior: Behavior normal. Behavior is cooperative.     ED Results and Treatments Labs (all labs ordered are listed, but only abnormal results are displayed) Labs Reviewed - No data to display                                                                                                                        Radiology No results found.  Pertinent labs & imaging results that were available during my care of the patient were reviewed by me and considered in my medical decision making (see MDM for details).  Medications Ordered in ED Medications  naproxen  (NAPROSYN) tablet 250 mg (250 mg Oral Given 11/01/22 1456)                                                                                                                                     Procedures Procedures  (including critical care time)  Medical Decision  Making / ED Course    Medical Decision Making:    Selina Tapper is a 77 y.o. female with past medical history as below, significant for DM, GERD, hypertension, CVA, cognitive impairment who presents to the ED with complaint of bodyaches , chronic pain. The complaint involves an extensive differential diagnosis and also carries with it a high risk of complications and morbidity.  Serious etiology was considered.   Complete initial physical exam performed, notably the patient  was NAD, sitting upright, exam stable.    Reviewed and confirmed nursing documentation for past medical history, family history, social history.  Vital signs reviewed.        Patient here with reported chronic pain to her wrists, ankles, back.  Pain unchanged from baseline.  She is requesting refill on lidocaine patches.  Strongly recommended she seek primary care evaluation but she reports that the bus stop is her outside the ER so she prefers to come here for all of her medical needs.     Gait steady, feeling better, tolerating po, stable for dc  The patient improved significantly and was discharged in stable condition. Detailed discussions were had with the patient regarding current findings, and need for close f/u with PCP or on call doctor. The patient has been instructed to return immediately if the symptoms worsen in any way for re-evaluation. Patient verbalized understanding and is in agreement with current care plan. All questions answered prior to discharge.                Additional history obtained: -Additional history obtained from na -External records from outside source obtained and reviewed including: Chart review including  previous notes, labs, imaging, consultation notes including  Recent ed visits Home medications   Lab Tests: na  EKG   EKG Interpretation Date/Time:    Ventricular Rate:    PR Interval:    QRS Duration:    QT Interval:    QTC Calculation:   R Axis:      Text Interpretation:           Imaging Studies ordered: na   Medicines ordered and prescription drug management: Meds ordered this encounter  Medications   naproxen (NAPROSYN) tablet 250 mg   lidocaine (LIDODERM) 5 %    Sig: Place 1 patch onto the skin daily as needed. Remove & Discard patch within 12 hours or as directed by MD    Dispense:  15 patch    Refill:  1    -I have reviewed the patients home medicines and have made adjustments as needed   Consultations Obtained: na   Cardiac Monitoring: na  Social Determinants of Health:  Diagnosis or treatment significantly limited by social determinants of health: no pcp   Reevaluation: After the interventions noted above, I reevaluated the patient and found that they have improved  Co morbidities that complicate the patient evaluation  Past Medical History:  Diagnosis Date   Diabetes mellitus without complication (HCC)    GERD 09/04/2006   Qualifier: Diagnosis of  By: Duke Salvia     History of echocardiogram    a. 2D ECHO: 11/06/2013 EF 65%; no WMA. Mild TR. PA pk pressure 43 mm HG   Hypertension    Stroke [facility])       Dispostion: Disposition decision including need for hospitalization was considered, and patient discharged from emergency department.    Final Clinical Impression(s) / ED Diagnoses Final diagnoses:  Other chronic pain  Sloan Leiter, DO 11/01/22 1504

## 2022-11-02 ENCOUNTER — Encounter (HOSPITAL_COMMUNITY): Payer: Self-pay | Admitting: *Deleted

## 2022-11-02 ENCOUNTER — Emergency Department (HOSPITAL_COMMUNITY)
Admission: EM | Admit: 2022-11-02 | Discharge: 2022-11-03 | Disposition: A | Discharge status: Home / Self Care | Payer: 59 | Attending: Emergency Medicine | Admitting: Emergency Medicine

## 2022-11-02 ENCOUNTER — Other Ambulatory Visit: Payer: Self-pay

## 2022-11-02 DIAGNOSIS — E119 Type 2 diabetes mellitus without complications: Secondary | ICD-10-CM | POA: Diagnosis not present

## 2022-11-02 DIAGNOSIS — Z79899 Other long term (current) drug therapy: Secondary | ICD-10-CM | POA: Diagnosis not present

## 2022-11-02 DIAGNOSIS — Z7902 Long term (current) use of antithrombotics/antiplatelets: Secondary | ICD-10-CM | POA: Insufficient documentation

## 2022-11-02 DIAGNOSIS — Z760 Encounter for issue of repeat prescription: Secondary | ICD-10-CM | POA: Insufficient documentation

## 2022-11-02 DIAGNOSIS — R519 Headache, unspecified: Secondary | ICD-10-CM | POA: Insufficient documentation

## 2022-11-02 DIAGNOSIS — Z7984 Long term (current) use of oral hypoglycemic drugs: Secondary | ICD-10-CM | POA: Insufficient documentation

## 2022-11-02 DIAGNOSIS — E1169 Type 2 diabetes mellitus with other specified complication: Secondary | ICD-10-CM

## 2022-11-02 DIAGNOSIS — I10 Essential (primary) hypertension: Secondary | ICD-10-CM | POA: Diagnosis not present

## 2022-11-02 NOTE — ED Triage Notes (Signed)
This pt is here almost every day  today she wants her rx refilled and she has a headache  [place]

## 2022-11-03 MED ORDER — METFORMIN HCL 500 MG PO TABS: 500.0000 mg | ORAL_TABLET | Freq: Two times a day (BID) | ORAL | 3 refills | Status: DC

## 2022-11-03 MED ORDER — ACETAMINOPHEN 500 MG PO TABS
1000.0000 mg | ORAL_TABLET | Freq: Once | ORAL | Status: AC
  Administered 2022-11-03: 1000 mg via ORAL
  Filled 2022-11-03: qty 2

## 2022-11-03 NOTE — ED Notes (Signed)
PT IS REPORTING CHRONIC PAIN AND WANTS HER MEDS/OINTMENTS REFILLED

## 2022-11-03 NOTE — Discharge Instructions (Signed)
Evaluation today was overall reassuring.  Sent metformin to your pharmacy.  Please pick up the medication at your convenience and begin taking it as soon as possible.  Recommend he follow-up PCP.  For your headache you can take Tylenol and ibuprofen at home.  Also recommend hydration.
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
## 2022-11-05 ENCOUNTER — Emergency Department (HOSPITAL_COMMUNITY)
Admission: EM | Admit: 2022-11-05 | Discharge: 2022-11-05 | Disposition: A | Discharge status: Home / Self Care | Payer: 59 | Attending: Emergency Medicine | Admitting: Emergency Medicine

## 2022-11-05 ENCOUNTER — Other Ambulatory Visit: Payer: Self-pay

## 2022-11-05 ENCOUNTER — Encounter (HOSPITAL_COMMUNITY): Payer: Self-pay

## 2022-11-05 DIAGNOSIS — M542 Cervicalgia: Secondary | ICD-10-CM | POA: Insufficient documentation

## 2022-11-05 DIAGNOSIS — G8929 Other chronic pain: Secondary | ICD-10-CM | POA: Diagnosis present

## 2022-11-05 DIAGNOSIS — M791 Myalgia, unspecified site: Secondary | ICD-10-CM | POA: Insufficient documentation

## 2022-11-05 DIAGNOSIS — I10 Essential (primary) hypertension: Secondary | ICD-10-CM | POA: Insufficient documentation

## 2022-11-05 DIAGNOSIS — M79606 Pain in leg, unspecified: Secondary | ICD-10-CM | POA: Diagnosis not present

## 2022-11-05 DIAGNOSIS — E119 Type 2 diabetes mellitus without complications: Secondary | ICD-10-CM | POA: Insufficient documentation

## 2022-11-05 MED ORDER — CYCLOBENZAPRINE HCL 10 MG PO TABS: 10.0000 mg | ORAL_TABLET | Freq: Two times a day (BID) | ORAL | 0 refills | Status: DC | PRN

## 2022-11-05 MED ORDER — NAPROXEN 250 MG PO TABS
250.0000 mg | ORAL_TABLET | Freq: Once | ORAL | Status: AC
  Administered 2022-11-05: 250 mg via ORAL
  Filled 2022-11-05: qty 1

## 2022-11-05 NOTE — Discharge Instructions (Addendum)
Please take tylenol/ibuprofen/naproxen for pain, muscle relaxant for symptom relief. I recommend close follow-up with PCP for reevaluation.  Please do not hesitate to return to emergency department if worrisome signs symptoms we discussed become apparent.
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
## 2022-11-09 ENCOUNTER — Other Ambulatory Visit: Payer: Self-pay

## 2022-11-09 ENCOUNTER — Encounter (HOSPITAL_COMMUNITY): Payer: Self-pay

## 2022-11-09 ENCOUNTER — Emergency Department (HOSPITAL_COMMUNITY)
Admission: EM | Admit: 2022-11-09 | Discharge: 2022-11-09 | Disposition: A | Discharge status: Home / Self Care | Payer: 59 | Attending: Emergency Medicine | Admitting: Emergency Medicine

## 2022-11-09 DIAGNOSIS — Z79899 Other long term (current) drug therapy: Secondary | ICD-10-CM | POA: Insufficient documentation

## 2022-11-09 DIAGNOSIS — G8929 Other chronic pain: Secondary | ICD-10-CM | POA: Insufficient documentation

## 2022-11-09 DIAGNOSIS — Z7902 Long term (current) use of antithrombotics/antiplatelets: Secondary | ICD-10-CM | POA: Diagnosis not present

## 2022-11-09 DIAGNOSIS — E119 Type 2 diabetes mellitus without complications: Secondary | ICD-10-CM | POA: Diagnosis not present

## 2022-11-09 DIAGNOSIS — I10 Essential (primary) hypertension: Secondary | ICD-10-CM | POA: Diagnosis not present

## 2022-11-09 DIAGNOSIS — M542 Cervicalgia: Secondary | ICD-10-CM | POA: Insufficient documentation

## 2022-11-09 DIAGNOSIS — Z7984 Long term (current) use of oral hypoglycemic drugs: Secondary | ICD-10-CM | POA: Diagnosis not present

## 2022-11-09 MED ORDER — LIDOCAINE 5 % EX PTCH
1.0000 | MEDICATED_PATCH | Freq: Once | CUTANEOUS | Status: DC
  Administered 2022-11-09: 1 via TRANSDERMAL
  Filled 2022-11-09: qty 1

## 2022-11-09 MED ORDER — IBUPROFEN 400 MG PO TABS
600.0000 mg | ORAL_TABLET | Freq: Once | ORAL | Status: AC
  Administered 2022-11-09: 600 mg via ORAL
  Filled 2022-11-09: qty 1

## 2022-11-09 MED ORDER — DICLOFENAC SODIUM 1 % EX GEL: 2.0000 g | Freq: Four times a day (QID) | CUTANEOUS | 0 refills | Status: DC

## 2022-11-09 NOTE — Discharge Instructions (Addendum)
RESOURCE GUIDE  Chronic Pain Problems: Contact [facility]  [phone] Patients need to be referred by their primary care doctor.  Insufficient Money for Medicine: Contact [employer]:  call [phone]  No Primary Care Doctor: Call [facility]  [phone] - can help you locate a primary care doctor that  accepts your insurance, provides certain services, etc. Physician Referral Service- [phone]  Agencies that provide inexpensive medical care: [name]  [phone] [facility]  [phone] [facility]  [phone] [facility]  [phone] [facility]  [phone] [facility]  [phone]  Medicaid-accepting [facility] Providers: [facility]- [address]  [phone], Mon-Fri 9am-7pm, Sat 9am-1pm [facility]- [address] [place], [address] [place]  [phone] [facility]- [address], [address] [place]  [phone] [facility]- [address]  [phone] Renaye Rakers- [address] [place], [address], [phone]  Only accepts [place] patients after they have their name  applied to their card  Self Pay (no insurance) in [facility]: Sickle Cell Patients - [facility] Internal [facility]  [address] [place], [phone] [facility]- [address] [place]  [phone]       [name] [place]- [address]       -     [facility]- see information above (Speak to [employer] if you do not have insurance)       -  [facility]- [address],  [phone]       -  [facility]- [address] [phone]       -  Dr Julio Sicks-  [address], [place], [phone]       -  [facility] and [facility] - [address] [phone]       -  [facility]- [address] [phone], also [address]  [phone]       -     [facility]- [address] [place], [phone], 1st & 3rd Saturday         every month, 10am-1pm  -     [facility]   [address] [place], [place].   Phone:  [phone], Fax:  [phone]. Hours of Operation:  9 am - 6 pm, M-F.  -     [facility]   [address], [place]   Phone: [phone], Fax: [phone]. Hours of Operation:  8:30 am - 5:30 pm, M-F.    Dental Assistance If unable to pay or uninsured, contact:  [facility]. to become qualified for the adult dental clinic.  Patients with Medicaid: [facility] [phone], [phone] [address] [phone]  If unable to pay, or uninsured, contact [facility] [phone] in [place], [phone] in [facility]) to become qualified for the adult dental clinic  [facility] [address] [place], [place] [address] [phone] [email or web address]  Other Proofreader Services: [facility]- [address] [place], [place], [place], [address], [phone], Ext. [phone], 2nd and 4th Thursday of the month at 6:30am.  10 clients each day by appointment, can sometimes see walk-in patients if someone does not show for an appointment.  [facility]- [address] [place], [place], [address], [phone] [facility] [address] [place], [place], [address], [phone] [facility] Health Department- [phone] [facility] Health Department- [phone] [facility] [phone]      It was a pleasure caring for you today in the emergency department.  Please return to the emergency department for any worsening or worrisome symptoms.
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
## 2022-11-09 NOTE — ED Provider Notes (Signed)
[place] EMERGENCY DEPARTMENT AT [facility] Provider Note  CSN: [id number] Arrival date & time: 11/09/22 1149  Chief Complaint(s) Neck Pain  HPI [name] is a 77 y.o. female with past medical history as below, significant for DM, GERD, hypertension, chronic pain who presents to the ED with complaint of chronic pain  Multi ED visits for similar complaint.  Here today with pain similar to her prior, I saw her little over a week ago, she seems to be at her baseline.  Requesting Motrin and refill on lidocaine patches.  No repeat falls or injuries, no changes to her chronic conditions  Past Medical History Past Medical History:  Diagnosis Date   Diabetes mellitus without complication (HCC)    GERD 09/04/2006   Qualifier: Diagnosis of  By: Duke Salvia     History of echocardiogram    a. 2D ECHO: 11/06/2013 EF 65%; no WMA. Mild TR. PA pk pressure 43 mm HG   Hypertension    Stroke [facility])    Patient Active Problem List   Diagnosis Date Noted   Cognitive impairment 06/16/2021   Chronic headache 06/16/2021   Healthcare maintenance 06/16/2021   Frequent patient in emergency department 06/16/2021   Cervical stenosis of spine 11/10/2019   Type 2 diabetes mellitus with hyperlipidemia (HCC) 06/20/2012   History of stroke 06/20/2012   Essential hypertension 09/04/2006   Osteoarthritis 09/04/2006   Home Medication(s) Prior to Admission medications   Medication Sig Start Date End Date Taking? Authorizing Provider  diclofenac Sodium (VOLTAREN) 1 % GEL Apply 2 g topically 4 (four) times daily. 11/09/22  Yes Sloan Leiter, DO  acetaminophen (TYLENOL) 500 MG tablet Take 1 tablet (500 mg total) by mouth every 6 (six) hours as needed. 06/28/22   Glyn Ade, MD  albuterol (VENTOLIN HFA) 108 (90 Base) MCG/ACT inhaler Inhale 1-2 puffs into the lungs every 6 (six) hours as needed for wheezing or shortness of breath. 04/22/20   Barbette Merino, NP  amLODipine (NORVASC) 10 MG tablet  Take 1 tablet (10 mg total) by mouth daily. 09/27/22   Rondel Baton, MD  chlorthalidone (HYGROTON) 25 MG tablet TAKE 1 TABLET (25 MG TOTAL) BY MOUTH DAILY. 11/20/21 11/20/22  Donell Beers, FNP  clopidogrel (PLAVIX) 75 MG tablet Take 1 tablet (75 mg total) by mouth daily. 06/15/21   Elige Radon, MD  cyclobenzaprine (FLEXERIL) 10 MG tablet Take 1 tablet (10 mg total) by mouth 2 (two) times daily as needed for muscle spasms. 10/27/22   Pricilla Loveless, MD  cyclobenzaprine (FLEXERIL) 10 MG tablet Take 1 tablet (10 mg total) by mouth 2 (two) times daily as needed for muscle spasms. 11/05/22   Jeanelle Malling, PA  diphenhydrAMINE (BENADRYL) 25 MG tablet Take 1 tablet (25 mg total) by mouth every 6 (six) hours as needed for itching or allergies. 10/03/22   Fayrene Helper, PA-C  gabapentin (NEURONTIN) 600 MG tablet Take 0.5 tablets (300 mg total) by mouth 3 (three) times daily. 10/27/22 12/26/22  Pricilla Loveless, MD  hydrocortisone cream 1 % Apply to affected area 2 times daily 10/22/22   Garlon Hatchet, PA-C  lidocaine (LIDODERM) 5 % Place 1 patch onto the skin daily as needed. Remove & Discard patch within 12 hours or as directed by MD 11/01/22   Sloan Leiter, DO  loratadine (CLARITIN) 10 MG tablet Take 1 tablet (10 mg total) by mouth daily. 10/17/22   Renne Crigler, PA-C  losartan (COZAAR) 50 MG tablet Take 1 tablet (  50 mg total) by mouth daily. 04/20/22 05/20/22  Roemhildt, Lorin T, PA-C  metFORMIN (GLUCOPHAGE) 500 MG tablet Take 1 tablet (500 mg total) by mouth 2 (two) times daily with a meal. 11/03/22 11/03/23  Gareth Eagle, PA-C  naproxen (NAPROSYN) 375 MG tablet Take 1 tablet (375 mg total) by mouth 2 (two) times daily as needed for moderate pain. 10/27/22   Pricilla Loveless, MD  pantoprazole (PROTONIX) 20 MG tablet Take 1 tablet (20 mg total) by mouth daily. 10/10/21   [name], MD  rosuvastatin (CRESTOR) 10 MG tablet Take 1 tablet (10 mg total) by mouth at bedtime. 09/27/22 09/27/23  Rondel Baton, MD  traMADol (ULTRAM) 50 MG tablet Take 1 tablet (50 mg total) by mouth every 6 (six) hours as needed. 05/31/22   Dione Booze, MD  triamcinolone cream (KENALOG) 0.1 % Apply 1 Application topically 2 (two) times daily. Apply to rash 09/27/22   Rondel Baton, MD                                                                                                                                    Past Surgical History Past Surgical History:  Procedure Laterality Date   ABDOMINAL HYSTERECTOMY     ANTERIOR CERVICAL DECOMPRESSION/DISCECTOMY FUSION 4 LEVELS N/A 11/11/2019   Procedure: Cervical three-four Cervical four-five Cervical five-six Cervical six-seven  Anterior cervical decompression/discectomy/fusion;  Surgeon: Maeola Harman, MD;  Location: [facility] OR;  Service: Neurosurgery;  Laterality: N/A;   LEFT HEART CATHETERIZATION WITH CORONARY ANGIOGRAM N/A 11/05/2013   Procedure: LEFT HEART CATHETERIZATION WITH CORONARY ANGIOGRAM;  Surgeon: Lennette Bihari, MD;  Location: [facility] C[facility];  Service: Cardiovascular;  Laterality: N/A;   Family History History reviewed. No pertinent family history.  Social History Social History   Tobacco Use   Smoking status: Never   Smokeless tobacco: Never  Vaping Use   Vaping status: Never Used  Substance Use Topics   Alcohol use: No   Drug use: No   Allergies Codeine, Ibuprofen, and Imdur [isosorbide nitrate]  Review of Systems Review of Systems  Constitutional:  Negative for fever.  Respiratory:  Negative for chest tightness and shortness of breath.   Cardiovascular:  Negative for chest pain.  Gastrointestinal:  Negative for abdominal pain.  Musculoskeletal:  Positive for arthralgias.  All other systems reviewed and are negative.   Physical Exam Vital Signs  I have reviewed the triage vital signs BP 133/60 (BP Location: Right Arm)   Pulse 73   Temp 97.8 F (36.6 C)   Resp 16   SpO2 100%  Physical Exam Vitals and nursing note reviewed.   Constitutional:      General: She is not in acute distress.    Appearance: Normal appearance. She is well-developed. She is not ill-appearing.  HENT:     Head: Normocephalic and atraumatic.     Right Ear: External ear normal.  Left Ear: External ear normal.     Nose: Nose normal.     Mouth/Throat:     Mouth: Mucous membranes are moist.  Eyes:     General: No scleral icterus.       Right eye: No discharge.        Left eye: No discharge.  Neck:     Comments: Wearing c-collar Cardiovascular:     Rate and Rhythm: Normal rate.  Pulmonary:     Effort: Pulmonary effort is normal. No respiratory distress.     Breath sounds: No stridor.  Abdominal:     General: Abdomen is flat. There is no distension.     Tenderness: There is no guarding.  Musculoskeletal:        General: No deformity.     Cervical back: No rigidity.  Skin:    General: Skin is warm and dry.     Coloration: Skin is not cyanotic, jaundiced or pale.  Neurological:     Mental Status: She is alert and oriented to person, place, and time.     GCS: GCS eye subscore is 4. GCS verbal subscore is 5. GCS motor subscore is 6.  Psychiatric:        Speech: Speech normal.        Behavior: Behavior normal. Behavior is cooperative.     ED Results and Treatments Labs (all labs ordered are listed, but only abnormal results are displayed) Labs Reviewed - No data to display                                                                                                                        Radiology No results found.  Pertinent labs & imaging results that were available during my care of the patient were reviewed by me and considered in my medical decision making (see MDM for details).  Medications Ordered in ED Medications  ibuprofen (ADVIL) tablet 600 mg (600 mg Oral Given 11/09/22 1310)                                                                                                                                      Procedures Procedures  (including critical care time)  Medical Decision Making / ED Course    Medical Decision Making:    Mehjabeen Torris is a 77 y.o. female with past medical history as below, significant for DM, GERD, hypertension, chronic  pain who presents to the ED with complaint of chronic pain. The complaint involves an extensive differential diagnosis and also carries with it a high risk of complications and morbidity.  Serious etiology was considered.   Complete initial physical exam performed, notably the patient  was nad sitting at bedside.    Reviewed and confirmed nursing documentation for past medical history, family history, social history.  Vital signs reviewed.        Patient appears to be her baseline, does not appear to be decompensated.  Symptoms improved with Motrin.  Tolerant p.o. intake without difficulty.  Encouraged her to seek PCP care to which she responded that she would much rather be seen by someone in the emergency department because the bus stop is closer  Given outpatient follow-up resources  The patient improved significantly and was discharged in stable condition. Detailed discussions were had with the patient regarding current findings, and need for close f/u with PCP or on call doctor. The patient has been instructed to return immediately if the symptoms worsen in any way for re-evaluation. Patient verbalized understanding and is in agreement with current care plan. All questions answered prior to discharge.                   Additional history obtained: -Additional history obtained from na -External records from outside source obtained and reviewed including: Chart review including previous notes, labs, imaging, consultation notes including  Prior ED visits, home medications   Lab Tests: na  EKG   EKG Interpretation Date/Time:    Ventricular Rate:    PR Interval:    QRS Duration:    QT Interval:    QTC Calculation:   R  Axis:      Text Interpretation:           Imaging Studies ordered: na   Medicines ordered and prescription drug management: Meds ordered this encounter  Medications   ibuprofen (ADVIL) tablet 600 mg   DISCONTD: lidocaine (LIDODERM) 5 % 1 patch   diclofenac Sodium (VOLTAREN) 1 % GEL    Sig: Apply 2 g topically 4 (four) times daily.    Dispense:  100 g    Refill:  0    -I have reviewed the patients home medicines and have made adjustments as needed   Consultations Obtained: na   Cardiac Monitoring: na  Social Determinants of Health:  Diagnosis or treatment significantly limited by social determinants of health: no pcp, lives alone   Reevaluation: After the interventions noted above, I reevaluated the patient and found that they have improved  Co morbidities that complicate the patient evaluation  Past Medical History:  Diagnosis Date   Diabetes mellitus without complication (HCC)    GERD 09/04/2006   Qualifier: Diagnosis of  By: Duke Salvia     History of echocardiogram    a. 2D ECHO: 11/06/2013 EF 65%; no WMA. Mild TR. PA pk pressure 43 mm HG   Hypertension    Stroke [facility])       Dispostion: Disposition decision including need for hospitalization was considered, and patient discharged from emergency department.    Final Clinical Impression(s) / ED Diagnoses Final diagnoses:  Other chronic pain        Sloan Leiter, DO 11/10/22 1707

## 2022-11-11 NOTE — Plan of Care (Signed)
[facility] Tonsillectomy/Adenoidectomy, Postoperative PEDS care plan entered in error.

## 2022-11-12 ENCOUNTER — Encounter (HOSPITAL_COMMUNITY): Payer: Self-pay

## 2022-11-12 ENCOUNTER — Emergency Department (HOSPITAL_COMMUNITY)
Admission: EM | Admit: 2022-11-12 | Discharge: 2022-11-13 | Disposition: A | Discharge status: Home / Self Care | Payer: 59 | Attending: Emergency Medicine | Admitting: Emergency Medicine

## 2022-11-12 DIAGNOSIS — M791 Myalgia, unspecified site: Secondary | ICD-10-CM | POA: Insufficient documentation

## 2022-11-12 DIAGNOSIS — G8929 Other chronic pain: Secondary | ICD-10-CM | POA: Insufficient documentation

## 2022-11-12 DIAGNOSIS — Z7901 Long term (current) use of anticoagulants: Secondary | ICD-10-CM | POA: Diagnosis not present

## 2022-11-12 NOTE — ED Triage Notes (Signed)
Pt to ED c/o all over body pain, chronic in nature. Pt wearing a miami j collar on arrival. Seen three days ago in ED for the same. Reports no relief with Tylenol

## 2022-11-13 MED ORDER — ACETAMINOPHEN 325 MG PO TABS
650.0000 mg | ORAL_TABLET | Freq: Once | ORAL | Status: AC
  Administered 2022-11-13: 650 mg via ORAL
  Filled 2022-11-13: qty 2
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
## 2022-11-13 NOTE — Discharge Instructions (Signed)
Can continue tylenol as needed for pain. Can also use the Voltaren gel you were given 2 days ago.

## 2022-11-17 ENCOUNTER — Encounter (HOSPITAL_COMMUNITY): Payer: Self-pay

## 2022-11-17 ENCOUNTER — Emergency Department (HOSPITAL_COMMUNITY)
Admission: EM | Admit: 2022-11-17 | Discharge: 2022-11-17 | Disposition: A | Discharge status: Home / Self Care | Payer: 59 | Attending: Emergency Medicine | Admitting: Emergency Medicine

## 2022-11-17 ENCOUNTER — Other Ambulatory Visit: Payer: Self-pay

## 2022-11-17 DIAGNOSIS — Z7902 Long term (current) use of antithrombotics/antiplatelets: Secondary | ICD-10-CM | POA: Diagnosis not present

## 2022-11-17 DIAGNOSIS — Z7984 Long term (current) use of oral hypoglycemic drugs: Secondary | ICD-10-CM | POA: Diagnosis not present

## 2022-11-17 DIAGNOSIS — Z79899 Other long term (current) drug therapy: Secondary | ICD-10-CM | POA: Diagnosis not present

## 2022-11-17 DIAGNOSIS — R52 Pain, unspecified: Secondary | ICD-10-CM | POA: Diagnosis present

## 2022-11-17 DIAGNOSIS — I10 Essential (primary) hypertension: Secondary | ICD-10-CM | POA: Insufficient documentation

## 2022-11-17 DIAGNOSIS — E119 Type 2 diabetes mellitus without complications: Secondary | ICD-10-CM | POA: Diagnosis not present

## 2022-11-17 DIAGNOSIS — G894 Chronic pain syndrome: Secondary | ICD-10-CM | POA: Insufficient documentation

## 2022-11-17 MED ORDER — ACETAMINOPHEN 325 MG PO TABS
650.0000 mg | ORAL_TABLET | Freq: Once | ORAL | Status: AC
  Administered 2022-11-17: 650 mg via ORAL
  Filled 2022-11-17: qty 2
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
## 2022-11-17 NOTE — Discharge Instructions (Signed)
Your exam today was reassuring.  Received Tylenol in the emergency department.  Take Tylenol at home.  Return for any concerning symptoms.
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
## 2022-11-17 NOTE — ED Notes (Signed)
Patient verbalizes understanding of discharge instructions. Opportunity for questioning and answers were provided. Armband removed by staff, pt discharged from ED. Pt ambulatory to ED waiting room with steady gait.  

## 2022-11-23 ENCOUNTER — Emergency Department (HOSPITAL_COMMUNITY)
Admission: EM | Admit: 2022-11-23 | Discharge: 2022-11-23 | Disposition: A | Discharge status: Home / Self Care | Payer: 59 | Attending: Emergency Medicine | Admitting: Emergency Medicine

## 2022-11-23 ENCOUNTER — Other Ambulatory Visit: Payer: Self-pay

## 2022-11-23 ENCOUNTER — Encounter (HOSPITAL_COMMUNITY): Payer: Self-pay | Admitting: Emergency Medicine

## 2022-11-23 DIAGNOSIS — B889 Infestation, unspecified: Secondary | ICD-10-CM | POA: Diagnosis not present

## 2022-11-23 DIAGNOSIS — B888 Other specified infestations: Secondary | ICD-10-CM

## 2022-11-23 DIAGNOSIS — R21 Rash and other nonspecific skin eruption: Secondary | ICD-10-CM | POA: Diagnosis present

## 2022-11-23 MED ORDER — HYDROXYZINE HCL 25 MG PO TABS: 25.0000 mg | ORAL_TABLET | Freq: Three times a day (TID) | ORAL | 0 refills | Status: DC | PRN

## 2022-11-23 NOTE — Discharge Instructions (Addendum)
You appear to have bedbugs.  You need to get an exterminator and wash all your belongings in very hot water  May take the hydroxyzine as needed for itching however you ultimately need to control the bedbugs to limit the itching

## 2022-11-23 NOTE — ED Notes (Signed)
Pt in NAD at d/c from ED. A&O. Ambulatory. Respirations even & unlabored. Skin warm & dry. Pt verbalized understanding of d/c teaching including follow up care, medications and reasons to return to the ED. No needs or questions expressed at d/c.

## 2022-11-23 NOTE — ED Triage Notes (Signed)
Pt here c/o chronic pain since her fall  on 08/02/2021
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
## 2022-12-14 ENCOUNTER — Other Ambulatory Visit: Payer: Self-pay

## 2022-12-14 ENCOUNTER — Emergency Department (HOSPITAL_COMMUNITY)
Admission: EM | Admit: 2022-12-14 | Discharge: 2022-12-14 | Disposition: A | Discharge status: Home / Self Care | Payer: 59 | Attending: Emergency Medicine | Admitting: Emergency Medicine

## 2022-12-14 ENCOUNTER — Encounter (HOSPITAL_COMMUNITY): Payer: Self-pay | Admitting: Emergency Medicine

## 2022-12-14 DIAGNOSIS — G8918 Other acute postprocedural pain: Secondary | ICD-10-CM | POA: Diagnosis not present

## 2022-12-14 DIAGNOSIS — G8929 Other chronic pain: Secondary | ICD-10-CM

## 2022-12-14 DIAGNOSIS — M791 Myalgia, unspecified site: Secondary | ICD-10-CM | POA: Diagnosis present

## 2022-12-14 MED ORDER — TRAMADOL HCL 50 MG PO TABS
50.0000 mg | ORAL_TABLET | Freq: Once | ORAL | Status: AC
  Administered 2022-12-14: 50 mg via ORAL
  Filled 2022-12-14: qty 1
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
## 2022-12-14 NOTE — Discharge Instructions (Signed)
Please take Tylenol as needed for pain.  As we discussed you should see orthopedic doctor  Return to ER if you have severe pain or fall

## 2022-12-14 NOTE — ED Provider Notes (Signed)
[place] EMERGENCY DEPARTMENT AT [facility] Provider Note   CSN: [id number] Arrival date & time: 12/14/22  1445     History  No chief complaint on file.   [name] is a 77 y.o. female history of chronic pain here presenting with bodyaches.  Patient had over 9 ED visits this past month for similar complaints.  Patient had a fall several months ago has been wearing c-collar.  Patient comes in with similar complaints.  Patient states that she just feels weak all over.  Denies another fall.  She states that she has never seen orthopedic doctor and just comes to the ER  The history is provided by the patient.       Home Medications Prior to Admission medications   Medication Sig Start Date End Date Taking? Authorizing Provider  acetaminophen (TYLENOL) 500 MG tablet Take 1 tablet (500 mg total) by mouth every 6 (six) hours as needed. 06/28/22   Glyn Ade, MD  albuterol (VENTOLIN HFA) 108 (90 Base) MCG/ACT inhaler Inhale 1-2 puffs into the lungs every 6 (six) hours as needed for wheezing or shortness of breath. 04/22/20   Barbette Merino, NP  amLODipine (NORVASC) 10 MG tablet Take 1 tablet (10 mg total) by mouth daily. 09/27/22   Rondel Baton, MD  chlorthalidone (HYGROTON) 25 MG tablet TAKE 1 TABLET (25 MG TOTAL) BY MOUTH DAILY. 11/20/21 11/20/22  Donell Beers, FNP  clopidogrel (PLAVIX) 75 MG tablet Take 1 tablet (75 mg total) by mouth daily. 06/15/21   Elige Radon, MD  cyclobenzaprine (FLEXERIL) 10 MG tablet Take 1 tablet (10 mg total) by mouth 2 (two) times daily as needed for muscle spasms. 10/27/22   Pricilla Loveless, MD  cyclobenzaprine (FLEXERIL) 10 MG tablet Take 1 tablet (10 mg total) by mouth 2 (two) times daily as needed for muscle spasms. 11/05/22   Jeanelle Malling, PA  diclofenac Sodium (VOLTAREN) 1 % GEL Apply 2 g topically 4 (four) times daily. 11/09/22   Sloan Leiter, DO  diphenhydrAMINE (BENADRYL) 25 MG tablet Take 1 tablet (25 mg total) by mouth  every 6 (six) hours as needed for itching or allergies. 10/03/22   Fayrene Helper, PA-C  gabapentin (NEURONTIN) 600 MG tablet Take 0.5 tablets (300 mg total) by mouth 3 (three) times daily. 10/27/22 12/26/22  Pricilla Loveless, MD  hydrocortisone cream 1 % Apply to affected area 2 times daily 10/22/22   Garlon Hatchet, PA-C  hydrOXYzine (ATARAX) 25 MG tablet Take 1 tablet (25 mg total) by mouth every 8 (eight) hours as needed. 11/23/22   Henderly, Britni A, PA-C  lidocaine (LIDODERM) 5 % Place 1 patch onto the skin daily as needed. Remove & Discard patch within 12 hours or as directed by MD 11/01/22   Sloan Leiter, DO  loratadine (CLARITIN) 10 MG tablet Take 1 tablet (10 mg total) by mouth daily. 10/17/22   Renne Crigler, PA-C  losartan (COZAAR) 50 MG tablet Take 1 tablet (50 mg total) by mouth daily. 04/20/22 05/20/22  Roemhildt, Lorin T, PA-C  metFORMIN (GLUCOPHAGE) 500 MG tablet Take 1 tablet (500 mg total) by mouth 2 (two) times daily with a meal. 11/03/22 11/03/23  Gareth Eagle, PA-C  naproxen (NAPROSYN) 375 MG tablet Take 1 tablet (375 mg total) by mouth 2 (two) times daily as needed for moderate pain. 10/27/22   Pricilla Loveless, MD  pantoprazole (PROTONIX) 20 MG tablet Take 1 tablet (20 mg total) by mouth daily. 10/10/21   Haviland,  Raynelle Fanning, MD  rosuvastatin (CRESTOR) 10 MG tablet Take 1 tablet (10 mg total) by mouth at bedtime. 09/27/22 09/27/23  Rondel Baton, MD  traMADol (ULTRAM) 50 MG tablet Take 1 tablet (50 mg total) by mouth every 6 (six) hours as needed. 05/31/22   Dione Booze, MD  triamcinolone cream (KENALOG) 0.1 % Apply 1 Application topically 2 (two) times daily. Apply to rash 09/27/22   Rondel Baton, MD      Allergies    Codeine, Ibuprofen, and Imdur [isosorbide nitrate]    Review of Systems   Review of Systems  Musculoskeletal:  Positive for arthralgias.  All other systems reviewed and are negative.   Physical Exam Updated Vital Signs BP (!) 185/72 (BP Location: Left Arm)    Pulse 84   Temp 98.7 F (37.1 C) (Oral)   Resp 18   SpO2 98%  Physical Exam Vitals and nursing note reviewed.  Constitutional:      Comments: Chronically ill and c-collar in place  HENT:     Head: Normocephalic.     Nose: Nose normal.     Mouth/Throat:     Mouth: Mucous membranes are moist.  Eyes:     Extraocular Movements: Extraocular movements intact.     Pupils: Pupils are equal, round, and reactive to light.  Cardiovascular:     Rate and Rhythm: Normal rate and regular rhythm.     Pulses: Normal pulses.     Heart sounds: Normal heart sounds.  Pulmonary:     Effort: Pulmonary effort is normal.     Breath sounds: Normal breath sounds.  Abdominal:     General: Abdomen is flat.     Palpations: Abdomen is soft.  Musculoskeletal:        General: Normal range of motion.     Cervical back: Normal range of motion.  Skin:    General: Skin is warm.     Capillary Refill: Capillary refill takes less than 2 seconds.  Neurological:     General: No focal deficit present.     Mental Status: She is oriented to person, place, and time.  Psychiatric:        Mood and Affect: Mood normal.        Behavior: Behavior normal.     ED Results / Procedures / Treatments   Labs (all labs ordered are listed, but only abnormal results are displayed) Labs Reviewed - No data to display  EKG None  Radiology No results found.  Procedures Procedures    Medications Ordered in ED Medications  traMADol (ULTRAM) tablet 50 mg (has no administration in time range)    ED Course/ Medical Decision Making/ A&P                                 Medical Decision Making [name] is a 77 y.o. female here presenting with pain.  Patient has chronic pain.  Patient is refused to see orthopedic doctor or spine for her neck problem.  Patient wears a c-collar at baseline.  At this point I do not think she has any emergent workup.    Problems Addressed: Other chronic pain: chronic illness or  injury  Risk Prescription drug management.   Final Clinical Impression(s) / ED Diagnoses Final diagnoses:  None    Rx / DC Orders ED Discharge Orders     None         [name],  MD 12/14/22 2040
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
## 2022-12-16 ENCOUNTER — Emergency Department (HOSPITAL_COMMUNITY)
Admission: EM | Admit: 2022-12-16 | Discharge: 2022-12-17 | Disposition: A | Discharge status: Home / Self Care | Payer: 59 | Attending: Emergency Medicine | Admitting: Emergency Medicine

## 2022-12-16 ENCOUNTER — Encounter (HOSPITAL_COMMUNITY): Payer: Self-pay

## 2022-12-16 ENCOUNTER — Emergency Department (HOSPITAL_COMMUNITY): Payer: 59

## 2022-12-16 DIAGNOSIS — M25561 Pain in right knee: Secondary | ICD-10-CM | POA: Insufficient documentation

## 2022-12-16 DIAGNOSIS — M25562 Pain in left knee: Secondary | ICD-10-CM | POA: Diagnosis not present

## 2022-12-16 DIAGNOSIS — M542 Cervicalgia: Secondary | ICD-10-CM | POA: Diagnosis present

## 2022-12-16 DIAGNOSIS — S79911A Unspecified injury of right hip, initial encounter: Secondary | ICD-10-CM | POA: Diagnosis present

## 2022-12-16 DIAGNOSIS — E119 Type 2 diabetes mellitus without complications: Secondary | ICD-10-CM | POA: Diagnosis not present

## 2022-12-16 DIAGNOSIS — S7001XA Contusion of right hip, initial encounter: Secondary | ICD-10-CM | POA: Insufficient documentation

## 2022-12-16 DIAGNOSIS — W19XXXA Unspecified fall, initial encounter: Secondary | ICD-10-CM

## 2022-12-16 DIAGNOSIS — Z79899 Other long term (current) drug therapy: Secondary | ICD-10-CM | POA: Diagnosis not present

## 2022-12-16 DIAGNOSIS — Z7984 Long term (current) use of oral hypoglycemic drugs: Secondary | ICD-10-CM | POA: Diagnosis not present

## 2022-12-16 DIAGNOSIS — W010XXA Fall on same level from slipping, tripping and stumbling without subsequent striking against object, initial encounter: Secondary | ICD-10-CM | POA: Insufficient documentation

## 2022-12-16 DIAGNOSIS — Z8673 Personal history of transient ischemic attack (TIA), and cerebral infarction without residual deficits: Secondary | ICD-10-CM | POA: Diagnosis not present

## 2022-12-16 DIAGNOSIS — I10 Essential (primary) hypertension: Secondary | ICD-10-CM | POA: Diagnosis not present

## 2022-12-16 DIAGNOSIS — Z7902 Long term (current) use of antithrombotics/antiplatelets: Secondary | ICD-10-CM | POA: Diagnosis not present

## 2022-12-16 MED ORDER — ACETAMINOPHEN 325 MG PO TABS
650.0000 mg | ORAL_TABLET | Freq: Once | ORAL | Status: AC
  Administered 2022-12-16: 650 mg via ORAL
  Filled 2022-12-16: qty 2
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
## 2022-12-16 NOTE — Discharge Instructions (Signed)
It is important to follow-up with your primary care physician, if you do not have 1 follow-up with the [facility].  Your x-ray today did not show any fractures.  Take Tylenol for pain.  If you develop new or worsening pain or inability to walk or any other new/concerning symptoms then return to the ER or call 911.
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
## 2022-12-16 NOTE — ED Notes (Signed)
Pt ambulated to the bathroom. Nt helped hold Pt's pants up, while walking, and pt was able to walk independently.

## 2022-12-17 ENCOUNTER — Other Ambulatory Visit: Payer: Self-pay

## 2022-12-17 ENCOUNTER — Emergency Department (HOSPITAL_COMMUNITY)
Admission: EM | Admit: 2022-12-17 | Discharge: 2022-12-17 | Disposition: A | Discharge status: Home / Self Care | Payer: 59 | Source: Home / Self Care | Attending: Student | Admitting: Student

## 2022-12-17 DIAGNOSIS — Z79899 Other long term (current) drug therapy: Secondary | ICD-10-CM | POA: Insufficient documentation

## 2022-12-17 DIAGNOSIS — Z8673 Personal history of transient ischemic attack (TIA), and cerebral infarction without residual deficits: Secondary | ICD-10-CM | POA: Insufficient documentation

## 2022-12-17 DIAGNOSIS — S7001XA Contusion of right hip, initial encounter: Secondary | ICD-10-CM | POA: Diagnosis not present

## 2022-12-17 DIAGNOSIS — Z7984 Long term (current) use of oral hypoglycemic drugs: Secondary | ICD-10-CM | POA: Insufficient documentation

## 2022-12-17 DIAGNOSIS — E119 Type 2 diabetes mellitus without complications: Secondary | ICD-10-CM | POA: Insufficient documentation

## 2022-12-17 DIAGNOSIS — I10 Essential (primary) hypertension: Secondary | ICD-10-CM | POA: Insufficient documentation

## 2022-12-17 DIAGNOSIS — M542 Cervicalgia: Secondary | ICD-10-CM | POA: Insufficient documentation

## 2022-12-17 DIAGNOSIS — Z7902 Long term (current) use of antithrombotics/antiplatelets: Secondary | ICD-10-CM | POA: Insufficient documentation

## 2022-12-17 MED ORDER — GABAPENTIN 100 MG PO CAPS
200.0000 mg | ORAL_CAPSULE | Freq: Once | ORAL | Status: AC
  Administered 2022-12-17: 200 mg via ORAL
  Filled 2022-12-17: qty 2
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
## 2022-12-20 ENCOUNTER — Emergency Department (HOSPITAL_COMMUNITY)
Admission: EM | Admit: 2022-12-20 | Discharge: 2022-12-20 | Disposition: A | Discharge status: Home / Self Care | Payer: 59

## 2022-12-20 ENCOUNTER — Other Ambulatory Visit: Payer: Self-pay

## 2022-12-20 ENCOUNTER — Encounter (HOSPITAL_COMMUNITY): Payer: Self-pay | Admitting: Emergency Medicine

## 2022-12-20 DIAGNOSIS — M79673 Pain in unspecified foot: Secondary | ICD-10-CM | POA: Diagnosis present

## 2022-12-20 DIAGNOSIS — G8929 Other chronic pain: Secondary | ICD-10-CM | POA: Diagnosis not present

## 2022-12-20 DIAGNOSIS — Z7984 Long term (current) use of oral hypoglycemic drugs: Secondary | ICD-10-CM | POA: Diagnosis not present

## 2022-12-20 MED ORDER — GABAPENTIN 100 MG PO CAPS
200.0000 mg | ORAL_CAPSULE | Freq: Once | ORAL | Status: AC
  Administered 2022-12-20: 200 mg via ORAL
  Filled 2022-12-20: qty 2

## 2022-12-20 NOTE — ED Triage Notes (Signed)
Patient may potentially have bed bugs. Neighbors saw the patient sitting on her porch complaining of foot pain. She reports 10/10 foot cramps.    EMS vitals 148/88 BP 22 RR 98% SPO2 on room air 97 HR 171 CBG

## 2022-12-20 NOTE — Discharge Instructions (Addendum)
Today you were seen for chronic pain. Thank you for letting us treat you today. After performing a physical exam, I feel you are safe to go home. Please follow up with your PCP in the next several days and provide them with your records from this visit. Return to the Emergency Room if pain becomes severe or symptoms worsen.

## 2022-12-20 NOTE — ED Provider Notes (Signed)
[place] EMERGENCY DEPARTMENT AT [facility] Provider Note   CSN: [id number] Arrival date & time: 12/20/22  1533     History  Chief Complaint  Patient presents with   Foot Pain    [name] is a 77 y.o. female past medical history significant for chronic pain presents today for pain in her hands.  Patient states she has been falling more recently.  Patient denies any new pain, weakness, chest pain, shortness of breath, head trauma, or fever.  Patient has been seen several times for chronic pain to include 3 days ago.  Patient endorsing chronic hand pain.   Foot Pain       Home Medications Prior to Admission medications   Medication Sig Start Date End Date Taking? Authorizing Provider  acetaminophen (TYLENOL) 500 MG tablet Take 1 tablet (500 mg total) by mouth every 6 (six) hours as needed. 06/28/22   Glyn Ade, MD  albuterol (VENTOLIN HFA) 108 (90 Base) MCG/ACT inhaler Inhale 1-2 puffs into the lungs every 6 (six) hours as needed for wheezing or shortness of breath. 04/22/20   Barbette Merino, NP  amLODipine (NORVASC) 10 MG tablet Take 1 tablet (10 mg total) by mouth daily. 09/27/22   Rondel Baton, MD  chlorthalidone (HYGROTON) 25 MG tablet TAKE 1 TABLET (25 MG TOTAL) BY MOUTH DAILY. 11/20/21 11/20/22  Donell Beers, FNP  clopidogrel (PLAVIX) 75 MG tablet Take 1 tablet (75 mg total) by mouth daily. 06/15/21   Elige Radon, MD  cyclobenzaprine (FLEXERIL) 10 MG tablet Take 1 tablet (10 mg total) by mouth 2 (two) times daily as needed for muscle spasms. 10/27/22   Pricilla Loveless, MD  cyclobenzaprine (FLEXERIL) 10 MG tablet Take 1 tablet (10 mg total) by mouth 2 (two) times daily as needed for muscle spasms. 11/05/22   Jeanelle Malling, PA  diclofenac Sodium (VOLTAREN) 1 % GEL Apply 2 g topically 4 (four) times daily. 11/09/22   Sloan Leiter, DO  diphenhydrAMINE (BENADRYL) 25 MG tablet Take 1 tablet (25 mg total) by mouth every 6 (six) hours as needed for  itching or allergies. 10/03/22   Fayrene Helper, PA-C  gabapentin (NEURONTIN) 600 MG tablet Take 0.5 tablets (300 mg total) by mouth 3 (three) times daily. 10/27/22 12/26/22  Pricilla Loveless, MD  hydrocortisone cream 1 % Apply to affected area 2 times daily 10/22/22   Garlon Hatchet, PA-C  hydrOXYzine (ATARAX) 25 MG tablet Take 1 tablet (25 mg total) by mouth every 8 (eight) hours as needed. 11/23/22   Henderly, Britni A, PA-C  lidocaine (LIDODERM) 5 % Place 1 patch onto the skin daily as needed. Remove & Discard patch within 12 hours or as directed by MD 11/01/22   Sloan Leiter, DO  loratadine (CLARITIN) 10 MG tablet Take 1 tablet (10 mg total) by mouth daily. 10/17/22   Renne Crigler, PA-C  losartan (COZAAR) 50 MG tablet Take 1 tablet (50 mg total) by mouth daily. 04/20/22 05/20/22  Roemhildt, Lorin T, PA-C  metFORMIN (GLUCOPHAGE) 500 MG tablet Take 1 tablet (500 mg total) by mouth 2 (two) times daily with a meal. 11/03/22 11/03/23  Gareth Eagle, PA-C  naproxen (NAPROSYN) 375 MG tablet Take 1 tablet (375 mg total) by mouth 2 (two) times daily as needed for moderate pain. 10/27/22   Pricilla Loveless, MD  pantoprazole (PROTONIX) 20 MG tablet Take 1 tablet (20 mg total) by mouth daily. 10/10/21   [name], MD  rosuvastatin (CRESTOR) 10 MG tablet  Take 1 tablet (10 mg total) by mouth at bedtime. 09/27/22 09/27/23  Rondel Baton, MD  traMADol (ULTRAM) 50 MG tablet Take 1 tablet (50 mg total) by mouth every 6 (six) hours as needed. 05/31/22   Dione Booze, MD  triamcinolone cream (KENALOG) 0.1 % Apply 1 Application topically 2 (two) times daily. Apply to rash 09/27/22   Rondel Baton, MD      Allergies    Codeine, Ibuprofen, and Imdur [isosorbide nitrate]    Review of Systems   Review of Systems  Musculoskeletal:  Positive for arthralgias.    Physical Exam Updated Vital Signs There were no vitals taken for this visit. Physical Exam Vitals and nursing note reviewed.  Constitutional:       General: She is not in acute distress.    Appearance: Normal appearance. She is well-developed.  HENT:     Head: Normocephalic and atraumatic.     Right Ear: External ear normal.     Left Ear: External ear normal.  Eyes:     Extraocular Movements: Extraocular movements intact.     Conjunctiva/sclera: Conjunctivae normal.  Neck:     Comments: Patient wearing [place] J which she has worn for years per previous notes Cardiovascular:     Rate and Rhythm: Normal rate and regular rhythm.     Heart sounds: No murmur heard. Pulmonary:     Effort: Pulmonary effort is normal. No respiratory distress.     Breath sounds: Normal breath sounds.  Abdominal:     Palpations: Abdomen is soft.     Tenderness: There is no abdominal tenderness.  Musculoskeletal:        General: No swelling, deformity or signs of injury. Normal range of motion.     Cervical back: Normal range of motion and neck supple. No rigidity or tenderness.     Right lower leg: No edema.     Left lower leg: No edema.  Skin:    General: Skin is warm and dry.     Capillary Refill: Capillary refill takes less than 2 seconds.     Findings: No bruising.  Neurological:     General: No focal deficit present.     Mental Status: She is alert.     Motor: No weakness.  Psychiatric:        Mood and Affect: Mood normal.     ED Results / Procedures / Treatments   Labs (all labs ordered are listed, but only abnormal results are displayed) Labs Reviewed - No data to display  EKG None  Radiology No results found.  Procedures Procedures    Medications Ordered in ED Medications  gabapentin (NEURONTIN) capsule 200 mg (has no administration in time range)    ED Course/ Medical Decision Making/ A&P                                 Medical Decision Making  This patient presents to the ED with chief complaint(s) of chronic hand pain with pertinent past medical history of chronic pain which further complicates the presenting  complaint. The complaint involves an extensive differential diagnosis and also carries with it a high risk of complications and morbidity.    The differential diagnosis includes chronic pain   ED Course and Reassessment:   Consultation: - Consulted or discussed management/test interpretation w/ external professional: None  Consideration for admission or further workup: Patient considered for admission or further workup  however patient's symptoms consistent with past history of chronic pain.  Patient has no recent traumas.  Patient believes a walker will help decrease future falls.         Final Clinical Impression(s) / ED Diagnoses Final diagnoses:  Other chronic pain    Rx / DC Orders ED Discharge Orders     None         Dolphus Jenny, PA-C 12/20/22 1726    Coral Spikes, DO 12/20/22 1851

## 2022-12-31 ENCOUNTER — Encounter (HOSPITAL_COMMUNITY): Payer: Self-pay

## 2022-12-31 ENCOUNTER — Emergency Department (HOSPITAL_COMMUNITY)
Admission: EM | Admit: 2022-12-31 | Discharge: 2023-01-01 | Disposition: A | Discharge status: Home / Self Care | Payer: 59

## 2022-12-31 ENCOUNTER — Other Ambulatory Visit: Payer: Self-pay

## 2022-12-31 DIAGNOSIS — Z7902 Long term (current) use of antithrombotics/antiplatelets: Secondary | ICD-10-CM | POA: Diagnosis not present

## 2022-12-31 DIAGNOSIS — M542 Cervicalgia: Secondary | ICD-10-CM | POA: Diagnosis not present

## 2022-12-31 DIAGNOSIS — G8929 Other chronic pain: Secondary | ICD-10-CM | POA: Insufficient documentation

## 2022-12-31 DIAGNOSIS — I10 Essential (primary) hypertension: Secondary | ICD-10-CM | POA: Diagnosis not present

## 2022-12-31 DIAGNOSIS — M79604 Pain in right leg: Secondary | ICD-10-CM | POA: Diagnosis not present

## 2022-12-31 DIAGNOSIS — Z8673 Personal history of transient ischemic attack (TIA), and cerebral infarction without residual deficits: Secondary | ICD-10-CM | POA: Diagnosis not present

## 2022-12-31 DIAGNOSIS — Z7984 Long term (current) use of oral hypoglycemic drugs: Secondary | ICD-10-CM | POA: Diagnosis not present

## 2022-12-31 DIAGNOSIS — R52 Pain, unspecified: Secondary | ICD-10-CM | POA: Diagnosis present

## 2022-12-31 DIAGNOSIS — E119 Type 2 diabetes mellitus without complications: Secondary | ICD-10-CM | POA: Diagnosis not present

## 2022-12-31 DIAGNOSIS — M79605 Pain in left leg: Secondary | ICD-10-CM | POA: Diagnosis present

## 2022-12-31 DIAGNOSIS — Z79899 Other long term (current) drug therapy: Secondary | ICD-10-CM | POA: Diagnosis not present

## 2022-12-31 NOTE — ED Triage Notes (Signed)
Pt arrived via POV c/o generalized body aches 10/10. Pt states that the pain medication does not work.

## 2023-01-01 ENCOUNTER — Emergency Department (HOSPITAL_COMMUNITY)
Admission: EM | Admit: 2023-01-01 | Discharge: 2023-01-02 | Disposition: A | Discharge status: Home / Self Care | Payer: 59 | Source: Home / Self Care | Attending: Emergency Medicine | Admitting: Emergency Medicine

## 2023-01-01 ENCOUNTER — Encounter (HOSPITAL_COMMUNITY): Payer: Self-pay

## 2023-01-01 DIAGNOSIS — M79605 Pain in left leg: Secondary | ICD-10-CM | POA: Insufficient documentation

## 2023-01-01 DIAGNOSIS — Z79899 Other long term (current) drug therapy: Secondary | ICD-10-CM | POA: Insufficient documentation

## 2023-01-01 DIAGNOSIS — Z8673 Personal history of transient ischemic attack (TIA), and cerebral infarction without residual deficits: Secondary | ICD-10-CM | POA: Insufficient documentation

## 2023-01-01 DIAGNOSIS — Z7902 Long term (current) use of antithrombotics/antiplatelets: Secondary | ICD-10-CM | POA: Insufficient documentation

## 2023-01-01 DIAGNOSIS — M79604 Pain in right leg: Secondary | ICD-10-CM | POA: Insufficient documentation

## 2023-01-01 DIAGNOSIS — Z7984 Long term (current) use of oral hypoglycemic drugs: Secondary | ICD-10-CM | POA: Insufficient documentation

## 2023-01-01 DIAGNOSIS — E119 Type 2 diabetes mellitus without complications: Secondary | ICD-10-CM | POA: Insufficient documentation

## 2023-01-01 DIAGNOSIS — G8929 Other chronic pain: Secondary | ICD-10-CM | POA: Insufficient documentation

## 2023-01-01 DIAGNOSIS — I10 Essential (primary) hypertension: Secondary | ICD-10-CM | POA: Insufficient documentation

## 2023-01-01 DIAGNOSIS — M542 Cervicalgia: Secondary | ICD-10-CM | POA: Diagnosis not present

## 2023-01-01 MED ORDER — TRAMADOL HCL 50 MG PO TABS
50.0000 mg | ORAL_TABLET | Freq: Once | ORAL | Status: AC
  Administered 2023-01-01: 50 mg via ORAL
  Filled 2023-01-01: qty 1

## 2023-01-01 MED ORDER — NAPROXEN 375 MG PO TABS
375.0000 mg | ORAL_TABLET | Freq: Once | ORAL | Status: AC
  Administered 2023-01-01: 375 mg via ORAL
  Filled 2023-01-01: qty 1

## 2023-01-01 MED ORDER — NAPROXEN 375 MG PO TABS: 375.0000 mg | ORAL_TABLET | Freq: Two times a day (BID) | ORAL | 0 refills | Status: DC | PRN
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
## 2023-01-01 NOTE — ED Provider Notes (Signed)
[place] EMERGENCY DEPARTMENT AT [facility] Provider Note   CSN: [id number] Arrival date & time: 12/31/22  1948     History  Chief Complaint  Patient presents with   Generalized Body Aches    [name] is a 77 y.o. female.  This is a 77 year old female presenting emergency department for pain all over.  History of chronic pain.  Frequents [facility] system with similar type complaints.  Denies any recent trauma.        Home Medications Prior to Admission medications   Medication Sig Start Date End Date Taking? Authorizing Provider  acetaminophen (TYLENOL) 500 MG tablet Take 1 tablet (500 mg total) by mouth every 6 (six) hours as needed. 06/28/22   Glyn Ade, MD  albuterol (VENTOLIN HFA) 108 (90 Base) MCG/ACT inhaler Inhale 1-2 puffs into the lungs every 6 (six) hours as needed for wheezing or shortness of breath. 04/22/20   Barbette Merino, NP  amLODipine (NORVASC) 10 MG tablet Take 1 tablet (10 mg total) by mouth daily. 09/27/22   Rondel Baton, MD  chlorthalidone (HYGROTON) 25 MG tablet TAKE 1 TABLET (25 MG TOTAL) BY MOUTH DAILY. 11/20/21 11/20/22  Donell Beers, FNP  clopidogrel (PLAVIX) 75 MG tablet Take 1 tablet (75 mg total) by mouth daily. 06/15/21   Elige Radon, MD  cyclobenzaprine (FLEXERIL) 10 MG tablet Take 1 tablet (10 mg total) by mouth 2 (two) times daily as needed for muscle spasms. 10/27/22   Pricilla Loveless, MD  cyclobenzaprine (FLEXERIL) 10 MG tablet Take 1 tablet (10 mg total) by mouth 2 (two) times daily as needed for muscle spasms. 11/05/22   Jeanelle Malling, PA  diclofenac Sodium (VOLTAREN) 1 % GEL Apply 2 g topically 4 (four) times daily. 11/09/22   Sloan Leiter, DO  diphenhydrAMINE (BENADRYL) 25 MG tablet Take 1 tablet (25 mg total) by mouth every 6 (six) hours as needed for itching or allergies. 10/03/22   Fayrene Helper, PA-C  gabapentin (NEURONTIN) 600 MG tablet Take 0.5 tablets (300 mg total) by mouth 3 (three) times  daily. 10/27/22 12/26/22  Pricilla Loveless, MD  hydrocortisone cream 1 % Apply to affected area 2 times daily 10/22/22   Garlon Hatchet, PA-C  hydrOXYzine (ATARAX) 25 MG tablet Take 1 tablet (25 mg total) by mouth every 8 (eight) hours as needed. 11/23/22   Henderly, Britni A, PA-C  lidocaine (LIDODERM) 5 % Place 1 patch onto the skin daily as needed. Remove & Discard patch within 12 hours or as directed by MD 11/01/22   Sloan Leiter, DO  loratadine (CLARITIN) 10 MG tablet Take 1 tablet (10 mg total) by mouth daily. 10/17/22   Renne Crigler, PA-C  losartan (COZAAR) 50 MG tablet Take 1 tablet (50 mg total) by mouth daily. 04/20/22 05/20/22  Roemhildt, Lorin T, PA-C  metFORMIN (GLUCOPHAGE) 500 MG tablet Take 1 tablet (500 mg total) by mouth 2 (two) times daily with a meal. 11/03/22 11/03/23  Gareth Eagle, PA-C  naproxen (NAPROSYN) 375 MG tablet Take 1 tablet (375 mg total) by mouth 2 (two) times daily as needed for moderate pain. 10/27/22   Pricilla Loveless, MD  pantoprazole (PROTONIX) 20 MG tablet Take 1 tablet (20 mg total) by mouth daily. 10/10/21   [name], MD  rosuvastatin (CRESTOR) 10 MG tablet Take 1 tablet (10 mg total) by mouth at bedtime. 09/27/22 09/27/23  Rondel Baton, MD  traMADol (ULTRAM) 50 MG tablet Take 1 tablet (50 mg total)  by mouth every 6 (six) hours as needed. 05/31/22   Dione Booze, MD  triamcinolone cream (KENALOG) 0.1 % Apply 1 Application topically 2 (two) times daily. Apply to rash 09/27/22   Rondel Baton, MD      Allergies    Codeine, Ibuprofen, and Imdur [isosorbide nitrate]    Review of Systems   Review of Systems  Physical Exam Updated Vital Signs BP (!) 145/93 (BP Location: Right Arm)   Pulse 82   Temp 98.1 F (36.7 C)   Resp 17   SpO2 100%  Physical Exam Vitals and nursing note reviewed.  Constitutional:      General: She is not in acute distress.    Appearance: She is not toxic-appearing.  HENT:     Head: Normocephalic.     Nose: Nose  normal.     Mouth/Throat:     Mouth: Mucous membranes are moist.  Eyes:     Conjunctiva/sclera: Conjunctivae normal.  Neck:     Comments: Wearing c-collar from home complaining of neck pain Cardiovascular:     Rate and Rhythm: Normal rate and regular rhythm.  Pulmonary:     Effort: Pulmonary effort is normal.     Breath sounds: Normal breath sounds.  Abdominal:     General: Abdomen is flat. There is no distension.     Tenderness: There is no abdominal tenderness. There is no guarding or rebound.  Musculoskeletal:        General: No tenderness.  Skin:    General: Skin is warm.     Capillary Refill: Capillary refill takes less than 2 seconds.  Neurological:     Mental Status: She is alert and oriented to person, place, and time.  Psychiatric:        Mood and Affect: Mood normal.        Behavior: Behavior normal.     ED Results / Procedures / Treatments   Labs (all labs ordered are listed, but only abnormal results are displayed) Labs Reviewed - No data to display  EKG None  Radiology No results found.  Procedures Procedures    Medications Ordered in ED Medications  traMADol (ULTRAM) tablet 50 mg (50 mg Oral Given 01/01/23 1321)    ED Course/ Medical Decision Making/ A&P                                 Medical Decision Making 77 year old female presenting emergency department for chronic pain.  She is afebrile vital signs reassuring.  She had no specific location, but states she just hurts all over.  She frequents [name] with multiple visits weekly for similar type complaint.  Does appear per chart review to have some socioeconomic barriers to care as she does not have PCP.  Will give dose of pain medication here.  At this point, I do not feel that she has an acute life or limb threatening injury or illness.  Based on vitals and exams, we will forego labs at this time. Stable for discharge.   Risk Prescription drug management.          Final Clinical  Impression(s) / ED Diagnoses Final diagnoses:  Other chronic pain    Rx / DC Orders ED Discharge Orders     None         Coral Spikes, DO 01/01/23 1322

## 2023-01-13 ENCOUNTER — Emergency Department (HOSPITAL_COMMUNITY)
Admission: EM | Admit: 2023-01-13 | Discharge: 2023-01-13 | Disposition: A | Discharge status: Home / Self Care | Payer: 59 | Attending: Emergency Medicine | Admitting: Emergency Medicine

## 2023-01-13 ENCOUNTER — Other Ambulatory Visit: Payer: Self-pay

## 2023-01-13 DIAGNOSIS — M542 Cervicalgia: Secondary | ICD-10-CM | POA: Insufficient documentation

## 2023-01-13 DIAGNOSIS — Z7902 Long term (current) use of antithrombotics/antiplatelets: Secondary | ICD-10-CM | POA: Insufficient documentation

## 2023-01-13 DIAGNOSIS — G8929 Other chronic pain: Secondary | ICD-10-CM | POA: Insufficient documentation

## 2023-01-13 DIAGNOSIS — M79641 Pain in right hand: Secondary | ICD-10-CM | POA: Insufficient documentation

## 2023-01-13 DIAGNOSIS — W19XXXA Unspecified fall, initial encounter: Secondary | ICD-10-CM | POA: Diagnosis not present

## 2023-01-13 DIAGNOSIS — M79642 Pain in left hand: Secondary | ICD-10-CM | POA: Diagnosis not present

## 2023-01-13 MED ORDER — ACETAMINOPHEN 500 MG PO TABS
1000.0000 mg | ORAL_TABLET | Freq: Four times a day (QID) | ORAL | 0 refills | Status: DC | PRN
Start: 2023-01-13 — End: 2023-04-10

## 2023-01-13 MED ORDER — DICLOFENAC SODIUM 1 % EX GEL: 2.0000 g | Freq: Four times a day (QID) | CUTANEOUS | 0 refills | Status: DC
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
## 2023-01-13 NOTE — Discharge Instructions (Signed)
You may take high-dose Tylenol for your pain.  You may apply Voltaren gel to your neck and your hands for comfort.  Follow-up with your primary care doctor for further managements of your condition.
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
## 2023-01-15 ENCOUNTER — Encounter (HOSPITAL_COMMUNITY): Payer: Self-pay

## 2023-01-15 ENCOUNTER — Other Ambulatory Visit: Payer: Self-pay

## 2023-01-15 ENCOUNTER — Emergency Department (HOSPITAL_COMMUNITY)
Admission: EM | Admit: 2023-01-15 | Discharge: 2023-01-15 | Disposition: A | Discharge status: Home / Self Care | Payer: 59 | Attending: Emergency Medicine | Admitting: Emergency Medicine

## 2023-01-15 DIAGNOSIS — Z79899 Other long term (current) drug therapy: Secondary | ICD-10-CM | POA: Insufficient documentation

## 2023-01-15 DIAGNOSIS — G894 Chronic pain syndrome: Secondary | ICD-10-CM | POA: Diagnosis not present

## 2023-01-15 DIAGNOSIS — R52 Pain, unspecified: Secondary | ICD-10-CM | POA: Insufficient documentation

## 2023-01-15 DIAGNOSIS — Z7984 Long term (current) use of oral hypoglycemic drugs: Secondary | ICD-10-CM | POA: Diagnosis not present

## 2023-01-15 DIAGNOSIS — Z7902 Long term (current) use of antithrombotics/antiplatelets: Secondary | ICD-10-CM | POA: Diagnosis not present

## 2023-01-15 MED ORDER — LIDOCAINE 5 % EX PTCH: 1.0000 | MEDICATED_PATCH | CUTANEOUS | 0 refills | Status: DC

## 2023-01-15 MED ORDER — HYDROCODONE-ACETAMINOPHEN 5-325 MG PO TABS
1.0000 | ORAL_TABLET | Freq: Once | ORAL | Status: AC
  Administered 2023-01-15: 1 via ORAL
  Filled 2023-01-15: qty 1

## 2023-01-15 NOTE — ED Notes (Signed)
Patient verbalizes understanding of discharge instructions. Opportunity for questioning and answers were provided. Armband removed by staff, pt discharged from ED. Pt taken to ED entrance via wheel chair.  
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
## 2023-01-15 NOTE — ED Triage Notes (Signed)
Pain all over. Same complaint she comes in for every time

## 2023-01-15 NOTE — Discharge Instructions (Addendum)
Use the pain patch to help with your pain.  Follow-up with a primary care or pain management doctor to help with your symptoms

## 2023-01-20 ENCOUNTER — Encounter (HOSPITAL_COMMUNITY): Payer: Self-pay

## 2023-01-20 ENCOUNTER — Other Ambulatory Visit: Payer: Self-pay

## 2023-01-20 ENCOUNTER — Emergency Department (HOSPITAL_COMMUNITY)
Admission: EM | Admit: 2023-01-20 | Discharge: 2023-01-20 | Disposition: A | Discharge status: Home / Self Care | Payer: 59 | Attending: Emergency Medicine | Admitting: Emergency Medicine

## 2023-01-20 DIAGNOSIS — G894 Chronic pain syndrome: Secondary | ICD-10-CM | POA: Diagnosis present

## 2023-01-20 DIAGNOSIS — I10 Essential (primary) hypertension: Secondary | ICD-10-CM | POA: Diagnosis not present

## 2023-01-20 DIAGNOSIS — Z7984 Long term (current) use of oral hypoglycemic drugs: Secondary | ICD-10-CM | POA: Diagnosis not present

## 2023-01-20 DIAGNOSIS — Z79899 Other long term (current) drug therapy: Secondary | ICD-10-CM | POA: Insufficient documentation

## 2023-01-20 DIAGNOSIS — Z7902 Long term (current) use of antithrombotics/antiplatelets: Secondary | ICD-10-CM | POA: Insufficient documentation

## 2023-01-20 MED ORDER — HYDROCODONE-ACETAMINOPHEN 7.5-325 MG PO TABS
1.0000 | ORAL_TABLET | Freq: Once | ORAL | Status: AC
  Administered 2023-01-20: 1 via ORAL
  Filled 2023-01-20: qty 1

## 2023-01-20 NOTE — ED Provider Triage Note (Cosign Needed)
Emergency Medicine Provider Triage Evaluation Note  [name] , a 77 y.o. female  was evaluated in triage.  Pt complains of all over body pain. States it's chronic from a fall. Denies any recent trauma.  Review of Systems  Positive: Body aches Negative: fevers  Physical Exam  BP (!) 154/81   Pulse 97   Temp 97.9 F (36.6 C) (Oral)   Resp 15   SpO2 100%  Gen:   Awake, no distress   Resp:  Normal effort  MSK:   Moves extremities without difficulty  Other:    Medical Decision Making  Medically screening exam initiated at 12:58 PM.  Appropriate orders placed.  Kamilly Heiserman was informed that the remainder of the evaluation will be completed by another provider, this initial triage assessment does not replace that evaluation, and the importance of remaining in the ED until their evaluation is complete.     Pete Pelt, [place] 01/20/23 1259

## 2023-01-20 NOTE — ED Triage Notes (Signed)
Patient reports she is hurting all over and tylenol is not helping.  Patient here chronically for the same thing.

## 2023-01-20 NOTE — ED Provider Notes (Signed)
[place] EMERGENCY DEPARTMENT AT [facility] Provider Note   CSN: [id number] Arrival date & time: 01/20/23  1217     History  Chief Complaint  Patient presents with   Pain    [name] is a 77 y.o. female history of hypertension, chronic pain, cervical stenosis here presenting with her chronic pain.  Patient has multiple ED visits for the same thing.  Patient wears a c-collar for comfort.  She states that she been taking Tylenol with no relief.  Patient requesting something stronger for pain.  Patient comes to the ER multiple times but has not seen a specialist or primary care doctor.  The history is provided by the patient.       Home Medications Prior to Admission medications   Medication Sig Start Date End Date Taking? Authorizing Provider  acetaminophen (TYLENOL) 500 MG tablet Take 2 tablets (1,000 mg total) by mouth every 6 (six) hours as needed for moderate pain (pain score 4-6). 01/13/23   Fayrene Helper, PA-C  albuterol (VENTOLIN HFA) 108 (90 Base) MCG/ACT inhaler Inhale 1-2 puffs into the lungs every 6 (six) hours as needed for wheezing or shortness of breath. 04/22/20   Barbette Merino, NP  amLODipine (NORVASC) 10 MG tablet Take 1 tablet (10 mg total) by mouth daily. 09/27/22   Rondel Baton, MD  chlorthalidone (HYGROTON) 25 MG tablet TAKE 1 TABLET (25 MG TOTAL) BY MOUTH DAILY. 11/20/21 11/20/22  Donell Beers, FNP  clopidogrel (PLAVIX) 75 MG tablet Take 1 tablet (75 mg total) by mouth daily. 06/15/21   Elige Radon, MD  diclofenac Sodium (VOLTAREN) 1 % GEL Apply 2 g topically 4 (four) times daily. 01/13/23   Fayrene Helper, PA-C  diphenhydrAMINE (BENADRYL) 25 MG tablet Take 1 tablet (25 mg total) by mouth every 6 (six) hours as needed for itching or allergies. 10/03/22   Fayrene Helper, PA-C  gabapentin (NEURONTIN) 600 MG tablet Take 0.5 tablets (300 mg total) by mouth 3 (three) times daily. 10/27/22 12/26/22  Pricilla Loveless, MD  hydrocortisone cream 1 %  Apply to affected area 2 times daily 10/22/22   Garlon Hatchet, PA-C  hydrOXYzine (ATARAX) 25 MG tablet Take 1 tablet (25 mg total) by mouth every 8 (eight) hours as needed. 11/23/22   Henderly, Britni A, PA-C  lidocaine (LIDODERM) 5 % Place 1 patch onto the skin daily. Remove & Discard patch within 12 hours or as directed by MD 01/15/23   Linwood Dibbles, MD  loratadine (CLARITIN) 10 MG tablet Take 1 tablet (10 mg total) by mouth daily. 10/17/22   Renne Crigler, PA-C  losartan (COZAAR) 50 MG tablet Take 1 tablet (50 mg total) by mouth daily. 04/20/22 05/20/22  Roemhildt, Lorin T, PA-C  metFORMIN (GLUCOPHAGE) 500 MG tablet Take 1 tablet (500 mg total) by mouth 2 (two) times daily with a meal. 11/03/22 11/03/23  Gareth Eagle, PA-C  naproxen (NAPROSYN) 375 MG tablet Take 1 tablet (375 mg total) by mouth 2 (two) times daily as needed for moderate pain (pain score 4-6). 01/01/23   Nira Conn, MD  pantoprazole (PROTONIX) 20 MG tablet Take 1 tablet (20 mg total) by mouth daily. 10/10/21   [name], MD  rosuvastatin (CRESTOR) 10 MG tablet Take 1 tablet (10 mg total) by mouth at bedtime. 09/27/22 09/27/23  Rondel Baton, MD  triamcinolone cream (KENALOG) 0.1 % Apply 1 Application topically 2 (two) times daily. Apply to rash 09/27/22   Rondel Baton, MD  Allergies    Codeine, Ibuprofen, and Imdur [isosorbide nitrate]    Review of Systems   Review of Systems  Musculoskeletal:        Neck pain   All other systems reviewed and are negative.   Physical Exam Updated Vital Signs BP 129/65 (BP Location: Right Arm)   Pulse 91   Temp 97.9 F (36.6 C) (Oral)   Resp 16   Ht 5\' 4"  (1.626 m)   Wt 50.8 kg   SpO2 100%   BMI 19.22 kg/m  Physical Exam Vitals and nursing note reviewed.  HENT:     Head: Normocephalic.     Mouth/Throat:     Mouth: Mucous membranes are moist.  Eyes:     Pupils: Pupils are equal, round, and reactive to light.  Neck:     Comments: Neck collar in  place  Cardiovascular:     Rate and Rhythm: Normal rate.     Pulses: Normal pulses.  Pulmonary:     Effort: Pulmonary effort is normal.     Breath sounds: Normal breath sounds.  Abdominal:     General: Abdomen is flat.     Palpations: Abdomen is soft.  Musculoskeletal:        General: Normal range of motion.  Skin:    General: Skin is warm.     Capillary Refill: Capillary refill takes less than 2 seconds.  Neurological:     General: No focal deficit present.     Mental Status: She is alert and oriented to person, place, and time.  Psychiatric:        Mood and Affect: Mood normal.        Behavior: Behavior normal.     ED Results / Procedures / Treatments   Labs (all labs ordered are listed, but only abnormal results are displayed) Labs Reviewed - No data to display  EKG None  Radiology No results found.  Procedures Procedures    Medications Ordered in ED Medications  HYDROcodone-acetaminophen (NORCO) 7.5-325 MG per tablet 1 tablet (has no administration in time range)    ED Course/ Medical Decision Making/ A&P                                 Medical Decision Making [name] is a 77 y.o. female here presenting chronic pain syndrome.  Patient is neurovascular intact.  This is a chronic problem and she has more than 50 ED visits over the last 6 months.  Patient has not seen her doctor.  Given a dose of Norco.  Patient is stable for discharge.   Problems Addressed: Chronic pain syndrome: chronic illness or injury  Risk Prescription drug management.    Final Clinical Impression(s) / ED Diagnoses Final diagnoses:  Chronic pain syndrome    Rx / DC Orders ED Discharge Orders     None         [name], MD 01/20/23 1840

## 2023-01-20 NOTE — Discharge Instructions (Signed)
You have chronic pain syndrome.  Please continue taking Tylenol as prescribed  See your doctor for follow-up  Return to ER if you have uncontrolled pain or trouble walking

## 2023-01-21 ENCOUNTER — Other Ambulatory Visit: Payer: Self-pay

## 2023-01-21 ENCOUNTER — Emergency Department (HOSPITAL_COMMUNITY)
Admission: EM | Admit: 2023-01-21 | Discharge: 2023-01-21 | Disposition: A | Discharge status: Home / Self Care | Payer: 59 | Attending: Emergency Medicine | Admitting: Emergency Medicine

## 2023-01-21 ENCOUNTER — Encounter (HOSPITAL_COMMUNITY): Payer: Self-pay

## 2023-01-21 DIAGNOSIS — R410 Disorientation, unspecified: Secondary | ICD-10-CM | POA: Insufficient documentation

## 2023-01-21 DIAGNOSIS — Z79899 Other long term (current) drug therapy: Secondary | ICD-10-CM | POA: Insufficient documentation

## 2023-01-21 DIAGNOSIS — Z7984 Long term (current) use of oral hypoglycemic drugs: Secondary | ICD-10-CM | POA: Insufficient documentation

## 2023-01-21 DIAGNOSIS — G8929 Other chronic pain: Secondary | ICD-10-CM | POA: Insufficient documentation

## 2023-01-21 DIAGNOSIS — M542 Cervicalgia: Secondary | ICD-10-CM | POA: Diagnosis present

## 2023-01-21 MED ORDER — ACETAMINOPHEN 325 MG PO TABS
650.0000 mg | ORAL_TABLET | Freq: Once | ORAL | Status: AC
  Administered 2023-01-21: 650 mg via ORAL
  Filled 2023-01-21: qty 2

## 2023-01-21 NOTE — Progress Notes (Signed)
Spoke with EDP regarding patient's frequent ED presentations for chronic pain. Suspect undiagnosed dementia as the reason for her frequent ED visits. EDP spoke with daughter who is willing to help coordinate care, requesting help with PCP follow up. She has now been scheduled to follow up with [place] in [facility] on 01/28/2023 for a new patient appointment.

## 2023-01-21 NOTE — ED Notes (Signed)
SW contacted for assistance in transportation.

## 2023-01-21 NOTE — Progress Notes (Signed)
CSW spoke with RN who states patient needs assistance getting home due to lack of transportation. Efforts were made to contact patient's daughter without success. CSW agreeable for patient to receive a cab voucher to go home.  Edwin Dada, MSW, LCSW Transitions of Care  Clinical Social Worker II [phone]
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
## 2023-01-21 NOTE — ED Notes (Signed)
Called SW back, pt given cab voucher per SW.
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
## 2023-01-22 ENCOUNTER — Encounter (HOSPITAL_COMMUNITY): Payer: Self-pay

## 2023-01-22 ENCOUNTER — Encounter (HOSPITAL_COMMUNITY): Payer: Self-pay | Admitting: Emergency Medicine

## 2023-01-22 ENCOUNTER — Other Ambulatory Visit: Payer: Self-pay

## 2023-01-22 ENCOUNTER — Emergency Department (HOSPITAL_COMMUNITY)
Admission: EM | Admit: 2023-01-22 | Discharge: 2023-01-23 | Disposition: A | Discharge status: Home / Self Care | Payer: 59 | Source: Home / Self Care | Attending: Emergency Medicine | Admitting: Emergency Medicine

## 2023-01-22 ENCOUNTER — Emergency Department (HOSPITAL_COMMUNITY)
Admission: EM | Admit: 2023-01-22 | Discharge: 2023-01-22 | Discharge status: Against Medical Advice | Payer: 59 | Attending: Emergency Medicine | Admitting: Emergency Medicine

## 2023-01-22 DIAGNOSIS — Z7902 Long term (current) use of antithrombotics/antiplatelets: Secondary | ICD-10-CM | POA: Insufficient documentation

## 2023-01-22 DIAGNOSIS — M791 Myalgia, unspecified site: Secondary | ICD-10-CM | POA: Insufficient documentation

## 2023-01-22 DIAGNOSIS — G8929 Other chronic pain: Secondary | ICD-10-CM | POA: Insufficient documentation

## 2023-01-22 DIAGNOSIS — Z5329 Procedure and treatment not carried out because of patient's decision for other reasons: Secondary | ICD-10-CM | POA: Diagnosis not present

## 2023-01-22 DIAGNOSIS — R52 Pain, unspecified: Secondary | ICD-10-CM | POA: Diagnosis present

## 2023-01-22 MED ORDER — ACETAMINOPHEN 500 MG PO TABS
1000.0000 mg | ORAL_TABLET | Freq: Once | ORAL | Status: DC
Start: 2023-01-22 — End: 2023-01-22

## 2023-01-22 NOTE — ED Provider Notes (Signed)
 [place] EMERGENCY DEPARTMENT AT [facility] Provider Note   CSN: [id number] Arrival date & time: 01/22/23  1716     History  Chief Complaint  Patient presents with   Generalized Pain    [name] is a 77 y.o. female, history of chronic pain, use here for her allover body pain again.  She states nothing is new, and that this is just chronic.  Has not taking any medications.  States she cannot wait until she is seen by her PCP on January 6.  She has not had any new acute injuries.  States her pain is not different than usual.  Home Medications Prior to Admission medications   Medication Sig Start Date End Date Taking? Authorizing Provider  acetaminophen  (TYLENOL ) 500 MG tablet Take 2 tablets (1,000 mg total) by mouth every 6 (six) hours as needed for moderate pain (pain score 4-6). 01/13/23   [name], PA-C  albuterol  (VENTOLIN  HFA) 108 (90 Base) MCG/ACT inhaler Inhale 1-2 puffs into the lungs every 6 (six) hours as needed for wheezing or shortness of breath. 04/22/20   Myrna Camelia HERO, NP  amLODipine  (NORVASC ) 10 MG tablet Take 1 tablet (10 mg total) by mouth daily. 09/27/22   Yolande Lamar BROCKS, MD  chlorthalidone  (HYGROTON ) 25 MG tablet TAKE 1 TABLET (25 MG TOTAL) BY MOUTH DAILY. 11/20/21 11/20/22  Paseda, Folashade R, FNP  clopidogrel  (PLAVIX ) 75 MG tablet Take 1 tablet (75 mg total) by mouth daily. 06/15/21   Sherlean Failing, MD  diclofenac  Sodium (VOLTAREN ) 1 % GEL Apply 2 g topically 4 (four) times daily. 01/13/23   [name], PA-C  diphenhydrAMINE  (BENADRYL ) 25 MG tablet Take 1 tablet (25 mg total) by mouth every 6 (six) hours as needed for itching or allergies. 10/03/22   [name], PA-C  gabapentin  (NEURONTIN ) 600 MG tablet Take 0.5 tablets (300 mg total) by mouth 3 (three) times daily. 10/27/22 12/26/22  Freddi Hamilton, MD  hydrocortisone  cream 1 % Apply to affected area 2 times daily 10/22/22   Jarold Olam HERO, PA-C  hydrOXYzine  (ATARAX ) 25 MG tablet Take 1  tablet (25 mg total) by mouth every 8 (eight) hours as needed. 11/23/22   Henderly, Britni A, PA-C  lidocaine  (LIDODERM ) 5 % Place 1 patch onto the skin daily. Remove & Discard patch within 12 hours or as directed by MD 01/15/23   Randol Simmonds, MD  loratadine  (CLARITIN ) 10 MG tablet Take 1 tablet (10 mg total) by mouth daily. 10/17/22   Geiple, Joshua, PA-C  losartan  (COZAAR ) 50 MG tablet Take 1 tablet (50 mg total) by mouth daily. 04/20/22 05/20/22  Roemhildt, Lorin T, PA-C  metFORMIN  (GLUCOPHAGE ) 500 MG tablet Take 1 tablet (500 mg total) by mouth 2 (two) times daily with a meal. 11/03/22 11/03/23  Lang Norleen POUR, PA-C  naproxen  (NAPROSYN ) 375 MG tablet Take 1 tablet (375 mg total) by mouth 2 (two) times daily as needed for moderate pain (pain score 4-6). 01/01/23   Trine Raynell Moder, MD  pantoprazole  (PROTONIX ) 20 MG tablet Take 1 tablet (20 mg total) by mouth daily. 10/10/21   Dean Clarity, MD  rosuvastatin  (CRESTOR ) 10 MG tablet Take 1 tablet (10 mg total) by mouth at bedtime. 09/27/22 09/27/23  Yolande Lamar BROCKS, MD  triamcinolone  cream (KENALOG ) 0.1 % Apply 1 Application topically 2 (two) times daily. Apply to rash 09/27/22   Yolande Lamar BROCKS, MD      Allergies    Codeine, Ibuprofen , and Imdur [isosorbide nitrate]  Review of Systems   Review of Systems  Musculoskeletal:  Negative for neck pain.    Physical Exam Updated Vital Signs There were no vitals taken for this visit. Physical Exam Vitals and nursing note reviewed.  Constitutional:      General: She is not in acute distress.    Appearance: She is well-developed.  HENT:     Head: Normocephalic and atraumatic.  Eyes:     Conjunctiva/sclera: Conjunctivae normal.  Neck:     Comments: C-collar present Cardiovascular:     Rate and Rhythm: Normal rate and regular rhythm.     Heart sounds: No murmur heard. Pulmonary:     Effort: Pulmonary effort is normal. No respiratory distress.     Breath sounds: Normal breath sounds.   Abdominal:     Palpations: Abdomen is soft.     Tenderness: There is no abdominal tenderness.  Musculoskeletal:        General: No swelling.     Cervical back: Neck supple.  Skin:    General: Skin is warm and dry.     Capillary Refill: Capillary refill takes less than 2 seconds.  Neurological:     Mental Status: She is alert.  Psychiatric:        Mood and Affect: Mood normal.     ED Results / Procedures / Treatments   Labs (all labs ordered are listed, but only abnormal results are displayed) Labs Reviewed - No data to display  EKG None  Radiology No results found.  Procedures Procedures    Medications Ordered in ED Medications  acetaminophen  (TYLENOL ) tablet 1,000 mg (has no administration in time range)    ED Course/ Medical Decision Making/ A&P                                 Medical Decision Making Discussed with patient, she is requesting narcotics for her chronic pain, I advised her that she has a PCP appointment on January 6, and this chronic pain issue.  She became agitated, and eloped.  Risk OTC drugs.    Final Clinical Impression(s) / ED Diagnoses Final diagnoses:  Other chronic pain    Rx / DC Orders ED Discharge Orders     None         Tabbitha Janvrin, Lyle CROME, PA 01/22/23 1741    Ruthe Cornet, DO 01/22/23 1909

## 2023-01-22 NOTE — ED Triage Notes (Signed)
Pt wishes to be seen for her chronic body pain and neck pain.

## 2023-01-22 NOTE — ED Provider Triage Note (Signed)
 Emergency Medicine Provider Triage Evaluation Note  Shi Grose , a 77 y.o. female  was evaluated in triage.  Pt complains of all over body pain.  Review of Systems  Positive: pain Negative: fevers  Physical Exam  There were no vitals taken for this visit. Gen:   Awake, no distress   Resp:  Normal effort  MSK:   Moves extremities without difficulty  Other:  C-collar in place  Medical Decision Making  Medically screening exam initiated at 8:49 PM.  Appropriate orders placed.  Teylor Wolven was informed that the remainder of the evaluation will be completed by another provider, this initial triage assessment does not replace that evaluation, and the importance of remaining in the ED until their evaluation is complete.     Philippa Lyle CROME, [place] 01/22/23 2049
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
## 2023-01-22 NOTE — ED Notes (Signed)
Pt became agitated before getting VS. Pt requesting narcotic pain meds for pain. Pt started yelling and eloped from department.

## 2023-01-23 ENCOUNTER — Emergency Department (HOSPITAL_COMMUNITY)
Admission: EM | Admit: 2023-01-23 | Discharge: 2023-01-23 | Disposition: A | Discharge status: Home / Self Care | Payer: 59 | Attending: Emergency Medicine | Admitting: Emergency Medicine

## 2023-01-23 DIAGNOSIS — M25511 Pain in right shoulder: Secondary | ICD-10-CM | POA: Insufficient documentation

## 2023-01-23 DIAGNOSIS — G8929 Other chronic pain: Secondary | ICD-10-CM | POA: Diagnosis not present

## 2023-01-23 DIAGNOSIS — I10 Essential (primary) hypertension: Secondary | ICD-10-CM

## 2023-01-23 DIAGNOSIS — Z91148 Patient's other noncompliance with medication regimen for other reason: Secondary | ICD-10-CM

## 2023-01-23 DIAGNOSIS — Z8673 Personal history of transient ischemic attack (TIA), and cerebral infarction without residual deficits: Secondary | ICD-10-CM

## 2023-01-23 DIAGNOSIS — Z79899 Other long term (current) drug therapy: Secondary | ICD-10-CM | POA: Insufficient documentation

## 2023-01-23 DIAGNOSIS — E1169 Type 2 diabetes mellitus with other specified complication: Secondary | ICD-10-CM

## 2023-01-23 MED ORDER — LIDOCAINE 5 % EX PTCH: 1.0000 | MEDICATED_PATCH | CUTANEOUS | 0 refills | Status: DC

## 2023-01-23 MED ORDER — ACETAMINOPHEN 500 MG PO TABS
1000.0000 mg | ORAL_TABLET | Freq: Once | ORAL | Status: AC
  Administered 2023-01-23: 1000 mg via ORAL
  Filled 2023-01-23: qty 2

## 2023-01-23 MED ORDER — AMLODIPINE BESYLATE 10 MG PO TABS: 10.0000 mg | ORAL_TABLET | Freq: Every day | ORAL | 3 refills | Status: DC

## 2023-01-23 MED ORDER — LIDOCAINE 5 % EX PTCH
1.0000 | MEDICATED_PATCH | Freq: Once | CUTANEOUS | Status: DC
  Administered 2023-01-23: 1 via TRANSDERMAL
  Filled 2023-01-23: qty 1

## 2023-01-23 MED ORDER — AMLODIPINE BESYLATE 5 MG PO TABS
10.0000 mg | ORAL_TABLET | Freq: Once | ORAL | Status: AC
  Administered 2023-01-23: 10 mg via ORAL
  Filled 2023-01-23: qty 2

## 2023-01-23 NOTE — Discharge Instructions (Addendum)
 I sent refills for your blood pressure medications to the pharmacy. We gave you a dose of your blood pressure medicine here so take the next dose tomorrow.

## 2023-01-23 NOTE — ED Provider Notes (Signed)
 [place] EMERGENCY DEPARTMENT AT [facility] Provider Note   CSN: [id number] Arrival date & time: 01/23/23  1527     History  No chief complaint on file.   [name] is a 78 y.o. female who presents the emergency department with concern for elevated blood pressure and right shoulder pain.  Patient was seen in the ER earlier this morning and was discharged around 1030, but states she was unable to go home because the buses were not running.  Right now she is complaining of pain in her right shoulder after [place] the city bus.  Of note, patient with 55 ER visits in past 6 months.   HPI     Home Medications Prior to Admission medications   Medication Sig Start Date End Date Taking? Authorizing Provider  acetaminophen  (TYLENOL ) 500 MG tablet Take 2 tablets (1,000 mg total) by mouth every 6 (six) hours as needed for moderate pain (pain score 4-6). 01/13/23   [name], PA-C  albuterol  (VENTOLIN  HFA) 108 (90 Base) MCG/ACT inhaler Inhale 1-2 puffs into the lungs every 6 (six) hours as needed for wheezing or shortness of breath. 04/22/20   Myrna Camelia HERO, NP  amLODipine  (NORVASC ) 10 MG tablet Take 1 tablet (10 mg total) by mouth daily. 01/23/23   Ettamae Barkett T, PA-C  chlorthalidone  (HYGROTON ) 25 MG tablet TAKE 1 TABLET (25 MG TOTAL) BY MOUTH DAILY. 11/20/21 11/20/22  Paseda, Folashade R, FNP  clopidogrel  (PLAVIX ) 75 MG tablet Take 1 tablet (75 mg total) by mouth daily. 06/15/21   Sherlean Failing, MD  diclofenac  Sodium (VOLTAREN ) 1 % GEL Apply 2 g topically 4 (four) times daily. 01/13/23   [name], PA-C  diphenhydrAMINE  (BENADRYL ) 25 MG tablet Take 1 tablet (25 mg total) by mouth every 6 (six) hours as needed for itching or allergies. 10/03/22   [name], PA-C  gabapentin  (NEURONTIN ) 600 MG tablet Take 0.5 tablets (300 mg total) by mouth 3 (three) times daily. 10/27/22 12/26/22  Freddi Hamilton, MD  hydrocortisone  cream 1 % Apply to affected area 2 times daily 10/22/22    Jarold Olam HERO, PA-C  hydrOXYzine  (ATARAX ) 25 MG tablet Take 1 tablet (25 mg total) by mouth every 8 (eight) hours as needed. 11/23/22   Henderly, Britni A, PA-C  lidocaine  (LIDODERM ) 5 % Place 1 patch onto the skin daily. Remove & Discard patch within 12 hours or as directed by MD 01/23/23   Macio Kissoon T, PA-C  loratadine  (CLARITIN ) 10 MG tablet Take 1 tablet (10 mg total) by mouth daily. 10/17/22   Geiple, Joshua, PA-C  metFORMIN  (GLUCOPHAGE ) 500 MG tablet Take 1 tablet (500 mg total) by mouth 2 (two) times daily with a meal. 11/03/22 11/03/23  Lang Norleen POUR, PA-C  naproxen  (NAPROSYN ) 375 MG tablet Take 1 tablet (375 mg total) by mouth 2 (two) times daily as needed for moderate pain (pain score 4-6). 01/01/23   Trine Raynell Moder, MD  pantoprazole  (PROTONIX ) 20 MG tablet Take 1 tablet (20 mg total) by mouth daily. 10/10/21   Dean Clarity, MD  rosuvastatin  (CRESTOR ) 10 MG tablet Take 1 tablet (10 mg total) by mouth at bedtime. 09/27/22 09/27/23  Yolande Lamar BROCKS, MD  triamcinolone  cream (KENALOG ) 0.1 % Apply 1 Application topically 2 (two) times daily. Apply to rash 09/27/22   Yolande Lamar BROCKS, MD      Allergies    Codeine, Ibuprofen , and Imdur [isosorbide nitrate]    Review of Systems   Review of Systems  Musculoskeletal:  Positive for arthralgias.  All other systems reviewed and are negative.   Physical Exam Updated Vital Signs BP (!) 174/76   Pulse 96   Temp 97.8 F (36.6 C) (Oral)   Resp 16   SpO2 99%  Physical Exam Vitals and nursing note reviewed.  Constitutional:      Appearance: Normal appearance.  HENT:     Head: Normocephalic and atraumatic.  Eyes:     Conjunctiva/sclera: Conjunctivae normal.  Neck:     Comments: Arrives in c-collar (chronic) Pulmonary:     Effort: Pulmonary effort is normal. No respiratory distress.  Musculoskeletal:     Comments: No gross deformity of the R shoulder  Skin:    General: Skin is warm and dry.  Neurological:     Mental  Status: She is alert.  Psychiatric:        Mood and Affect: Mood normal.        Behavior: Behavior normal.     ED Results / Procedures / Treatments   Labs (all labs ordered are listed, but only abnormal results are displayed) Labs Reviewed - No data to display  EKG None  Radiology No results found.  Procedures Procedures    Medications Ordered in ED Medications  lidocaine  (LIDODERM ) 5 % 1 patch (has no administration in time range)  acetaminophen  (TYLENOL ) tablet 1,000 mg (has no administration in time range)  amLODipine  (NORVASC ) tablet 10 mg (has no administration in time range)    ED Course/ Medical Decision Making/ A&P                                 Medical Decision Making Risk OTC drugs. Prescription drug management.  This patient is a 78 y.o. female who presents to the ED for concern of chronic pain, elevated BP.   Past Medical History / Social History / Additional history: Chart reviewed. Pertinent results include: Hypertension, arthritis, type 2 diabetes, chronic headache, cognitive impairment  Patient with 55 ER visits in the past 6 months, most recently this morning.  Physical Exam: Physical exam performed. The pertinent findings include: Hypertensive, otherwise normal vital signs.  No acute distress.  No gross deformity of the right shoulder, neurovascular intact in all extremities.  Medications / Treatment: Ordered dose of home blood pressure medication, Lidoderm  patch per patient request, dose of Tylenol .   Disposition: After consideration of the diagnostic results and the patients response to treatment, I feel that emergency department workup does not suggest an emergent condition requiring admission or immediate intervention beyond what has been performed at this time. The plan is: Charged home.  Refill of blood pressure medication sent to [facility] at patient's request.. The patient is safe for discharge and has been instructed to return immediately  for worsening symptoms, change in symptoms or any other concerns.  Final Clinical Impression(s) / ED Diagnoses Final diagnoses:  Chronic right shoulder pain  Primary hypertension  Patient's other noncompliance with medication regimen for other reason    Rx / DC Orders ED Discharge Orders          Ordered    amLODipine  (NORVASC ) 10 MG tablet  Daily        01/23/23 1949    lidocaine  (LIDODERM ) 5 %  Every 24 hours        01/23/23 1950           Portions of this report may have been transcribed using voice  recognition software. Every effort was made to ensure accuracy; however, inadvertent computerized transcription errors may be present.    Mikell Kazlauskas T, PA-C 01/23/23 1959    Bernard Drivers, MD 01/28/23 1409    Bernard Drivers, MD 01/28/23 [phone]

## 2023-01-23 NOTE — ED Triage Notes (Signed)
 Patient states she thinks her blood pressure is high. Discharged at 1029 today but was unable to make it home because the busses were not running d/t the holiday.

## 2023-01-23 NOTE — Discharge Instructions (Signed)
 It was a pleasure taking care of you today.  You were evaluated in the emergency room for generalized body pain.  You were given Tylenol  for pain.  You are provided resources to establish with a primary care provider.  Would encourage you to make an appointment in the near future to manage your chronic pain.  If you experience any new or worsening of your current symptoms please return to the emergency room.

## 2023-01-23 NOTE — ED Notes (Signed)
 While getting vitals, three red bugs noted on patient crawling in neck brace and jacket.

## 2023-01-23 NOTE — ED Provider Notes (Signed)
 [place] EM[facility]GENCY DEPARTMENT AT  HOSPITAL Provider Note   CSN: [id number] Arrival date & time: 01/22/23  2038     History  Chief Complaint  Patient presents with   Pain    [name] is a 78 y.o. female who presents with chronic pain, all over her body again.  Symptoms initially started reportedly from a fall sometime ago.  Denies any new pain or symptoms.  Has not followed up with a primary care provider.  Reports that she  has to do with breast for, so she will continue to come to [facility].  Continues to wear neck brace while running errands.   HPI     Home Medications Prior to Admission medications   Medication Sig Start Date End Date Taking? Authorizing Provider  acetaminophen  (TYLENOL ) 500 MG tablet Take 2 tablets (1,000 mg total) by mouth every 6 (six) hours as needed for moderate pain (pain score 4-6). 01/13/23   [name], PA-C  albuterol  (VENTOLIN  HFA) 108 [phone] Base) MCG/ACT inhaler Inhale 1-2 puffs into the lungs every 6 (six) hours as needed for wheezing or shortness of breath. 04/22/20   Myrna Camelia HERO, NP  amLODipine  (NORVASC ) 10 MG tablet Take 1 tablet (10 mg total) by mouth daily. 09/27/22   Yolande Lamar BROCKS, MD  chlorthalidone  (HYGROTON ) 25 MG tablet TAKE 1 TABLET (25 MG TOTAL) BY MOUTH DAILY. 11/20/21 11/20/22  Paseda, Folashade R, FNP  clopidogrel  (PLAVIX ) 75 MG tablet Take 1 tablet (75 mg total) by mouth daily. 06/15/21   Sherlean Failing, MD  diclofenac  Sodium (VOLTAREN ) 1 % GEL Apply 2 g topically 4 (four) times daily. 01/13/23   [name], PA-C  diphenhydrAMINE  (BENADRYL ) 25 MG tablet Take 1 tablet (25 mg total) by mouth every 6 (six) hours as needed for itching or allergies. 10/03/22   [name], PA-C  gabapentin  (NEURONTIN ) 600 MG tablet Take 0.5 tablets (300 mg total) by mouth 3 (three) times daily. 10/27/22 12/26/22  Freddi Hamilton, MD  hydrocortisone  cream 1 % Apply to affected area 2 times daily 10/22/22   Jarold Olam HERO, PA-C  hydrOXYzine   (ATARAX ) 25 MG tablet Take 1 tablet (25 mg total) by mouth every 8 (eight) hours as needed. 11/23/22   Henderly, Britni A, PA-C  lidocaine  (LIDODERM ) 5 % Place 1 patch onto the skin daily. Remove & Discard patch within 12 hours or as directed by MD 01/15/23   Randol Simmonds, MD  loratadine  (CLARITIN ) 10 MG tablet Take 1 tablet (10 mg total) by mouth daily. 10/17/22   Geiple, Joshua, PA-C  losartan  (COZAAR ) 50 MG tablet Take 1 tablet (50 mg total) by mouth daily. 04/20/22 05/20/22  Roemhildt, Lorin T, PA-C  metFORMIN  (GLUCOPHAGE ) 500 MG tablet Take 1 tablet (500 mg total) by mouth 2 (two) times daily with a meal. 11/03/22 11/03/23  Lang Norleen POUR, PA-C  naproxen  (NAPROSYN ) 375 MG tablet Take 1 tablet (375 mg total) by mouth 2 (two) times daily as needed for moderate pain (pain score 4-6). 01/01/23   Trine Raynell Moder, MD  pantoprazole  (PROTONIX ) 20 MG tablet Take 1 tablet (20 mg total) by mouth daily. 10/10/21   Dean Clarity, MD  rosuvastatin  (CRESTOR ) 10 MG tablet Take 1 tablet (10 mg total) by mouth at bedtime. 09/27/22 09/27/23  Yolande Lamar BROCKS, MD  triamcinolone  cream (KENALOG ) 0.1 % Apply 1 Application topically 2 (two) times daily. Apply to rash 09/27/22   Yolande Lamar BROCKS, MD      Allergies    Codeine,  Ibuprofen , and Imdur [isosorbide nitrate]    Review of Systems   Review of Systems  Musculoskeletal:  Positive for arthralgias and myalgias.    Physical Exam Updated Vital Signs BP (!) 158/70 (BP Location: Right Arm)   Pulse 83   Temp 98.1 F (36.7 C) (Oral)   Resp 20   SpO2 100%  Physical Exam Vitals and nursing note reviewed.  Constitutional:      General: She is not in acute distress.    Appearance: She is well-developed.  HENT:     Head: Normocephalic and atraumatic.  Eyes:     Conjunctiva/sclera: Conjunctivae normal.  Neck:     Comments: Generalized tenderness, neck collar in place Cardiovascular:     Rate and Rhythm: Normal rate and regular rhythm.     Heart sounds:  No murmur heard. Pulmonary:     Effort: Pulmonary effort is normal. No respiratory distress.     Breath sounds: Normal breath sounds.  Abdominal:     Palpations: Abdomen is soft.     Tenderness: There is no abdominal tenderness.  Musculoskeletal:        General: No swelling.     Cervical back: Normal range of motion and neck supple.  Skin:    General: Skin is warm and dry.     Capillary Refill: Capillary refill takes less than 2 seconds.  Neurological:     Mental Status: She is alert.  Psychiatric:        Mood and Affect: Mood normal.     ED Results / Procedures / Treatments   Labs (all labs ordered are listed, but only abnormal results are displayed) Labs Reviewed - No data to display  EKG None  Radiology No results found.  Procedures Procedures    Medications Ordered in ED Medications  acetaminophen  (TYLENOL ) tablet 1,000 mg (has no administration in time range)    ED Course/ Medical Decision Making/ A&P                                 Medical Decision Making Risk OTC drugs.   This patient presents to the ED with chief complaint(s) of chronic full body pain.  The complaint involves an extensive differential diagnosis and also carries with it a high risk of complications and morbidity.   pertinent past medical history as listed in HPI  The differential diagnosis includes  Fracture, sprain, arthritis, septic joint  Additional history obtained:  Records reviewed previous admission documents, demonstrating numerous similar presentations with negative workup.  Initial Assessment:   Patient is hemodynamically stable, nontoxic-appearing presenting with chronic full body pain.  No new pain or focal deficit.  Do not feel that repeated imaging is indicated today.  Encourage patient to follow-up with primary care provider, however she continues to deny interest in doing so. Additionally encouraged patient to wean from neck brace.  Requesting something for pain, will  give Tylenol  and discharge.  Independent ECG interpretation:  none  Independent labs interpretation:  The following labs were independently interpreted:  none  Independent visualization and interpretation of imaging: none  Treatment and Reassessment: Patient given tylenol  1000 mg following first assessment  Consultations obtained:   none  Disposition:   Patient discharged home, encouraged to establish with primary care provider. The patient has been appropriately medically screened and/or stabilized in the ED. I have low suspicion for any other emergent medical condition which would require further screening, evaluation or  treatment in the ED or require inpatient management. At time of discharge the patient is hemodynamically stable and in no acute distress. I have discussed work-up results and diagnosis with patient and answered all questions. Patient is agreeable with discharge plan. We discussed strict return precautions for returning to the emergency department and they verbalized understanding.     Social Determinants of Health:   Patient's lack of insurance, lack of prescription access, and impaired access to primary care  increases the complexity of managing their presentation  This note was dictated with voice recognition software.  Despite best efforts at proofreading, errors may have occurred which can change the documentation meaning.          Final Clinical Impression(s) / ED Diagnoses Final diagnoses:  Other chronic pain    Rx / DC Orders ED Discharge Orders     None         Donnajean Lynwood DEL, PA-C 01/23/23 1017    Emil Share, DO 01/23/23 1020

## 2023-01-23 NOTE — ED Notes (Signed)
 Patient discharged by this RN. Patient verbalizes understanding of instructions with no additional questions.

## 2023-01-24 ENCOUNTER — Emergency Department (HOSPITAL_COMMUNITY): Admission: EM | Admit: 2023-01-24 | Discharge: 2023-01-24 | Discharge status: Against Medical Advice | Payer: 59

## 2023-01-24 NOTE — ED Triage Notes (Signed)
 Pt was asked to take a shower to decon the bedbugs however she refused.  PA Sharilyn Sites informed RN that pt could leave the ED should she choose not to shower to keep from infecting everybody.  Pt displayed steady gait and showed no signs of distress.

## 2023-01-28 ENCOUNTER — Encounter: Payer: 59 | Admitting: Student

## 2023-01-28 NOTE — Progress Notes (Deleted)
   Established Patient Office Visit  Subjective   Patient ID: [name], female    DOB: October 04, 1945  Age: 78 y.o. MRN: [id number]  No chief complaint on file.   HPI  {History (Optional):23778}  ROS    Objective:     There were no vitals taken for this visit. {Vitals History (Optional):23777}  Physical Exam   No results found for any visits on 01/28/23.  {Labs (Optional):23779}  The ASCVD Risk score (Arnett DK, et al., 2019) failed to calculate for the following reasons:   Risk score cannot be calculated because patient has a medical history suggesting prior/existing ASCVD    Assessment & Plan:   Problem List Items Addressed This Visit   None   No follow-ups on file.    Toma Edwards, DO

## 2023-01-29 ENCOUNTER — Emergency Department (HOSPITAL_COMMUNITY)
Admission: EM | Admit: 2023-01-29 | Discharge: 2023-01-29 | Disposition: A | Discharge status: Home / Self Care | Payer: 59 | Attending: Emergency Medicine | Admitting: Emergency Medicine

## 2023-01-29 ENCOUNTER — Emergency Department (HOSPITAL_COMMUNITY)
Admission: EM | Admit: 2023-01-29 | Discharge: 2023-01-29 | Disposition: A | Discharge status: Home / Self Care | Payer: 59 | Source: Home / Self Care

## 2023-01-29 ENCOUNTER — Encounter (HOSPITAL_COMMUNITY): Payer: Self-pay

## 2023-01-29 ENCOUNTER — Other Ambulatory Visit: Payer: Self-pay

## 2023-01-29 DIAGNOSIS — R52 Pain, unspecified: Secondary | ICD-10-CM | POA: Insufficient documentation

## 2023-01-29 DIAGNOSIS — M7918 Myalgia, other site: Secondary | ICD-10-CM | POA: Insufficient documentation

## 2023-01-29 DIAGNOSIS — Z7902 Long term (current) use of antithrombotics/antiplatelets: Secondary | ICD-10-CM | POA: Insufficient documentation

## 2023-01-29 MED ORDER — ACETAMINOPHEN 500 MG PO TABS
1000.0000 mg | ORAL_TABLET | Freq: Once | ORAL | Status: AC
  Administered 2023-01-29: 1000 mg via ORAL
  Filled 2023-01-29: qty 2
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
## 2023-01-29 NOTE — ED Notes (Signed)
 Pt d/c home per EDP order. Discharge summary reviewed, verbalize understanding. Ambulatory NAD.

## 2023-01-29 NOTE — ED Provider Notes (Signed)
 [place] EMERGENCY DEPARTMENT AT [facility] Provider Note   CSN: [id number] Arrival date & time: 01/29/23  1918     History  Chief Complaint  Patient presents with   multiple complaints    [name] is a 78 y.o. female who presents again with pain all over.  Attributes her pain from prior fall.  Denies any new injuries or trauma.  No new pain.  She often takes Tylenol , however did not today.  Has been here numerous times for similar complaint.  HPI     Home Medications Prior to Admission medications   Medication Sig Start Date End Date Taking? Authorizing Provider  acetaminophen  (TYLENOL ) 500 MG tablet Take 2 tablets (1,000 mg total) by mouth every 6 (six) hours as needed for moderate pain (pain score 4-6). 01/13/23   [name], PA-C  albuterol  (VENTOLIN  HFA) 108 (90 Base) MCG/ACT inhaler Inhale 1-2 puffs into the lungs every 6 (six) hours as needed for wheezing or shortness of breath. 04/22/20   Myrna Camelia HERO, NP  amLODipine  (NORVASC ) 10 MG tablet Take 1 tablet (10 mg total) by mouth daily. 01/23/23   Roemhildt, Lorin T, PA-C  chlorthalidone  (HYGROTON ) 25 MG tablet TAKE 1 TABLET (25 MG TOTAL) BY MOUTH DAILY. 11/20/21 11/20/22  Paseda, Folashade R, FNP  clopidogrel  (PLAVIX ) 75 MG tablet Take 1 tablet (75 mg total) by mouth daily. 06/15/21   Sherlean Failing, MD  diclofenac  Sodium (VOLTAREN ) 1 % GEL Apply 2 g topically 4 (four) times daily. 01/13/23   [name], PA-C  diphenhydrAMINE  (BENADRYL ) 25 MG tablet Take 1 tablet (25 mg total) by mouth every 6 (six) hours as needed for itching or allergies. 10/03/22   [name], PA-C  gabapentin  (NEURONTIN ) 600 MG tablet Take 0.5 tablets (300 mg total) by mouth 3 (three) times daily. 10/27/22 12/26/22  Freddi Hamilton, MD  hydrocortisone  cream 1 % Apply to affected area 2 times daily 10/22/22   Jarold Olam HERO, PA-C  hydrOXYzine  (ATARAX ) 25 MG tablet Take 1 tablet (25 mg total) by mouth every 8 (eight) hours as needed. 11/23/22    Henderly, Britni A, PA-C  lidocaine  (LIDODERM ) 5 % Place 1 patch onto the skin daily. Remove & Discard patch within 12 hours or as directed by MD 01/23/23   Roemhildt, Lorin T, PA-C  loratadine  (CLARITIN ) 10 MG tablet Take 1 tablet (10 mg total) by mouth daily. 10/17/22   Geiple, Joshua, PA-C  metFORMIN  (GLUCOPHAGE ) 500 MG tablet Take 1 tablet (500 mg total) by mouth 2 (two) times daily with a meal. 11/03/22 11/03/23  Lang Norleen POUR, PA-C  naproxen  (NAPROSYN ) 375 MG tablet Take 1 tablet (375 mg total) by mouth 2 (two) times daily as needed for moderate pain (pain score 4-6). 01/01/23   Trine Raynell Moder, MD  pantoprazole  (PROTONIX ) 20 MG tablet Take 1 tablet (20 mg total) by mouth daily. 10/10/21   Dean Clarity, MD  rosuvastatin  (CRESTOR ) 10 MG tablet Take 1 tablet (10 mg total) by mouth at bedtime. 09/27/22 09/27/23  Yolande Lamar BROCKS, MD  triamcinolone  cream (KENALOG ) 0.1 % Apply 1 Application topically 2 (two) times daily. Apply to rash 09/27/22   Yolande Lamar BROCKS, MD      Allergies    Codeine, Ibuprofen , and Imdur [isosorbide nitrate]    Review of Systems   Review of Systems  Musculoskeletal:  Positive for arthralgias and myalgias.    Physical Exam Updated Vital Signs BP 114/70 (BP Location: Right Arm)   Pulse 93  Temp 98.1 F (36.7 C)   Resp 16   Ht 5' 4 (1.626 m)   Wt 49.9 kg   SpO2 99%   BMI 18.88 kg/m  Physical Exam Vitals and nursing note reviewed.  Constitutional:      General: She is not in acute distress.    Appearance: She is well-developed.     Comments: Cervical brace in place  HENT:     Head: Normocephalic and atraumatic.  Eyes:     Conjunctiva/sclera: Conjunctivae normal.  Cardiovascular:     Rate and Rhythm: Normal rate and regular rhythm.     Heart sounds: No murmur heard. Pulmonary:     Effort: Pulmonary effort is normal. No respiratory distress.     Breath sounds: Normal breath sounds.  Abdominal:     Palpations: Abdomen is soft.      Tenderness: There is no abdominal tenderness.  Musculoskeletal:        General: No swelling.     Cervical back: Neck supple.     Comments: No midline cervical tenderness, 5 out of 5 extremity strength  Skin:    General: Skin is warm and dry.     Capillary Refill: Capillary refill takes less than 2 seconds.  Neurological:     Mental Status: She is alert.  Psychiatric:        Mood and Affect: Mood normal.     ED Results / Procedures / Treatments   Labs (all labs ordered are listed, but only abnormal results are displayed) Labs Reviewed - No data to display  EKG None  Radiology No results found.  Procedures Procedures    Medications Ordered in ED Medications  acetaminophen  (TYLENOL ) tablet 1,000 mg (has no administration in time range)    ED Course/ Medical Decision Making/ A&P                                 Medical Decision Making  This patient presents to the ED with chief complaint(s) of pain all over.  The complaint involves an extensive differential diagnosis and also carries with it a high risk of complications and morbidity.   pertinent past medical history as listed in HPI  The differential diagnosis includes  Fracture, sprain, contusion, intracranial abnormality  Additional history obtained:  Records reviewed previous admission documents  Initial Assessment:   Hemodynamically stable, nontoxic-appearing patient presenting with very similar complaints of pain all over.  No new complaints.  No new injury.  Has had prior unremarkable imaging.  Do not feel the need to repeat any imaging today.  Neuro exam without focal deficit.  No red flag symptoms.  Independent ECG interpretation:  none  Independent labs interpretation:  The following labs were independently interpreted:  none  Independent visualization and interpretation of imaging: none  Treatment and Reassessment: Patient given 1000 mg Tylenol  following first assessment  Consultations obtained:    none  Disposition:   Patient will be discharged home, encouraged to follow with primary care provider.  Provided resources. The patient has been appropriately medically screened and/or stabilized in the ED. I have low suspicion for any other emergent medical condition which would require further screening, evaluation or treatment in the ED or require inpatient management. At time of discharge the patient is hemodynamically stable and in no acute distress. I have discussed work-up results and diagnosis with patient and answered all questions. Patient is agreeable with discharge plan. We discussed strict  return precautions for returning to the emergency department and they verbalized understanding.     Social Determinants of Health:   Patient's impaired access to primary care  increases the complexity of managing their presentation  This note was dictated with voice recognition software.  Despite best efforts at proofreading, errors may have occurred which can change the documentation meaning.          Final Clinical Impression(s) / ED Diagnoses Final diagnoses:  Pain    Rx / DC Orders ED Discharge Orders     None         Carlo Lorson H, PA-C 01/29/23 2119    Doretha Folks, MD 01/29/23 [phone]

## 2023-01-29 NOTE — Discharge Instructions (Signed)
 It was a pleasure taking care of you today.  Your evaluated in the emergency room for pain all over.  You were given Tylenol  for pain.  You were additionally provided resources for primary care provider.  Please call call and establish care.  If you experience any new or worsening symptoms please return to the emergency room.

## 2023-01-29 NOTE — ED Provider Notes (Signed)
 [place] EMERGENCY DEPARTMENT AT [facility] Provider Note   CSN: [id number] Arrival date & time: 01/29/23  1247     History  Chief Complaint  Patient presents with   Generalized Body Aches    [name] is a 78 y.o. female.  HPI With chronic pain presents with pain all over.  She has multiple prior falls, prior neck injury, presents with cervical spine collar in place.  She notes that she continued to have pain, but has not taken any pain medication today.  She typically takes Tylenol , unclear why she has not done so today.  No new weakness, no new pain, no new fever.    Home Medications Prior to Admission medications   Medication Sig Start Date End Date Taking? Authorizing Provider  acetaminophen  (TYLENOL ) 500 MG tablet Take 2 tablets (1,000 mg total) by mouth every 6 (six) hours as needed for moderate pain (pain score 4-6). 01/13/23   [name], PA-C  albuterol  (VENTOLIN  HFA) 108 (90 Base) MCG/ACT inhaler Inhale 1-2 puffs into the lungs every 6 (six) hours as needed for wheezing or shortness of breath. 04/22/20   Myrna Camelia HERO, NP  amLODipine  (NORVASC ) 10 MG tablet Take 1 tablet (10 mg total) by mouth daily. 01/23/23   Roemhildt, Lorin T, PA-C  chlorthalidone  (HYGROTON ) 25 MG tablet TAKE 1 TABLET (25 MG TOTAL) BY MOUTH DAILY. 11/20/21 11/20/22  Paseda, Folashade R, FNP  clopidogrel  (PLAVIX ) 75 MG tablet Take 1 tablet (75 mg total) by mouth daily. 06/15/21   Sherlean Failing, MD  diclofenac  Sodium (VOLTAREN ) 1 % GEL Apply 2 g topically 4 (four) times daily. 01/13/23   [name], PA-C  diphenhydrAMINE  (BENADRYL ) 25 MG tablet Take 1 tablet (25 mg total) by mouth every 6 (six) hours as needed for itching or allergies. 10/03/22   [name], PA-C  gabapentin  (NEURONTIN ) 600 MG tablet Take 0.5 tablets (300 mg total) by mouth 3 (three) times daily. 10/27/22 12/26/22  Freddi Hamilton, MD  hydrocortisone  cream 1 % Apply to affected area 2 times daily 10/22/22   Jarold Olam HERO,  PA-C  hydrOXYzine  (ATARAX ) 25 MG tablet Take 1 tablet (25 mg total) by mouth every 8 (eight) hours as needed. 11/23/22   Henderly, Britni A, PA-C  lidocaine  (LIDODERM ) 5 % Place 1 patch onto the skin daily. Remove & Discard patch within 12 hours or as directed by MD 01/23/23   Roemhildt, Lorin T, PA-C  loratadine  (CLARITIN ) 10 MG tablet Take 1 tablet (10 mg total) by mouth daily. 10/17/22   Geiple, Joshua, PA-C  metFORMIN  (GLUCOPHAGE ) 500 MG tablet Take 1 tablet (500 mg total) by mouth 2 (two) times daily with a meal. 11/03/22 11/03/23  Lang Norleen POUR, PA-C  naproxen  (NAPROSYN ) 375 MG tablet Take 1 tablet (375 mg total) by mouth 2 (two) times daily as needed for moderate pain (pain score 4-6). 01/01/23   Trine Raynell Moder, MD  pantoprazole  (PROTONIX ) 20 MG tablet Take 1 tablet (20 mg total) by mouth daily. 10/10/21   Dean Clarity, MD  rosuvastatin  (CRESTOR ) 10 MG tablet Take 1 tablet (10 mg total) by mouth at bedtime. 09/27/22 09/27/23  Yolande Lamar BROCKS, MD  triamcinolone  cream (KENALOG ) 0.1 % Apply 1 Application topically 2 (two) times daily. Apply to rash 09/27/22   Yolande Lamar BROCKS, MD      Allergies    Codeine, Ibuprofen , and Imdur [isosorbide nitrate]    Review of Systems   Review of Systems  Physical Exam Updated Vital Signs  BP (!) 149/72 (BP Location: Left Arm)   Pulse 91   Temp 98 F (36.7 C)   Resp 18   Wt 50 kg   SpO2 100%   BMI 18.92 kg/m  Physical Exam Vitals and nursing note reviewed.  Constitutional:      Appearance: She is well-developed. She is not diaphoretic.  HENT:     Head: Normocephalic and atraumatic.     Right Ear: External ear normal.     Left Ear: External ear normal.  Eyes:     General: No scleral icterus.       Right eye: No discharge.        Left eye: No discharge.     Conjunctiva/sclera: Conjunctivae normal.  Neck:     Trachea: No tracheal deviation.     Comments: Patient wears a cervical spine collar Pulmonary:     Effort: Pulmonary effort  is normal. No respiratory distress.     Breath sounds: No stridor.  Abdominal:     General: There is no distension.  Musculoskeletal:        General: No swelling or deformity.     Cervical back: Neck supple.     Comments: No focal areas of tenderness, no swelling  Skin:    General: Skin is warm and dry.     Findings: No rash.  Neurological:     Mental Status: She is alert. Mental status is at baseline.     Cranial Nerves: No dysarthria or facial asymmetry.     Motor: No seizure activity.     ED Results / Procedures / Treatments   Labs (all labs ordered are listed, but only abnormal results are displayed) Labs Reviewed - No data to display  EKG None  Radiology No results found.  Procedures Procedures    Medications Ordered in ED Medications  acetaminophen  (TYLENOL ) tablet 1,000 mg (has no administration in time range)    ED Course/ Medical Decision Making/ A&P                                 Medical Decision Making Adult female with chronic pain possibly due to prior falls, prior neck injury now presents with cervical collar in place, no hemodynamic instability, no focal weakness, no focal complaints beyond ongoing pain.  Patient is not taking any pain medication today, suspicion for acute on chronic pain.  Patient received additional Tylenol , and absent other evidence for acute new phenomenon patient discharged in stable condition.  Amount and/or Complexity of Data Reviewed External Data Reviewed: notes.    Details: To 3 prior ED visits over the past 6 months, notes reviewed.  Risk OTC drugs. Diagnosis or treatment significantly limited by social determinants of health.  Final Clinical Impression(s) / ED Diagnoses Final diagnoses:  Pain    Rx / DC Orders ED Discharge Orders     None         Garrick Charleston, MD 01/29/23 1417

## 2023-01-29 NOTE — Discharge Instructions (Signed)
 Be sure to follow-up with your primary care physician.  Return here for concerning changes in your condition.

## 2023-01-29 NOTE — ED Triage Notes (Signed)
 Pt c/o all over body aches and that tylenol is not helping. Pt also requesting stronger eye drops, Also requesting ace wraps.  Wearing neck brace "for a long while"   Per chart review pt to ED earlier today for similar complaint.

## 2023-02-04 ENCOUNTER — Emergency Department (HOSPITAL_COMMUNITY)
Admission: EM | Admit: 2023-02-04 | Discharge: 2023-02-05 | Disposition: A | Discharge status: Home / Self Care | Payer: 59 | Attending: Emergency Medicine | Admitting: Emergency Medicine

## 2023-02-04 ENCOUNTER — Other Ambulatory Visit: Payer: Self-pay

## 2023-02-04 DIAGNOSIS — G8929 Other chronic pain: Secondary | ICD-10-CM | POA: Diagnosis not present

## 2023-02-04 DIAGNOSIS — M791 Myalgia, unspecified site: Secondary | ICD-10-CM | POA: Diagnosis present
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
## 2023-02-05 MED ORDER — ACETAMINOPHEN 325 MG PO TABS
650.0000 mg | ORAL_TABLET | Freq: Once | ORAL | Status: AC
  Administered 2023-02-05: 650 mg via ORAL
  Filled 2023-02-05: qty 2
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
## 2023-02-05 NOTE — Discharge Instructions (Signed)
 Please follow-up with your primary care provider of the 1 I have attached your for you today in regards to recent symptoms and ER visit.  Today your exam was reassuring and you declined being swabbed for viral illnesses.  Please take Tylenol  every 6 hours as needed for pain however if this is not helping you you will need to see a primary care provider for long-term management.  If symptoms change or worsen please return to the ER.

## 2023-02-07 ENCOUNTER — Emergency Department (HOSPITAL_COMMUNITY)
Admission: EM | Admit: 2023-02-07 | Discharge: 2023-02-08 | Disposition: A | Discharge status: Home / Self Care | Payer: 59 | Attending: Emergency Medicine | Admitting: Emergency Medicine

## 2023-02-07 ENCOUNTER — Emergency Department (HOSPITAL_COMMUNITY)
Admission: EM | Admit: 2023-02-07 | Discharge: 2023-02-07 | Disposition: A | Discharge status: Home / Self Care | Payer: 59 | Attending: Emergency Medicine | Admitting: Emergency Medicine

## 2023-02-07 ENCOUNTER — Other Ambulatory Visit: Payer: Self-pay

## 2023-02-07 DIAGNOSIS — G8929 Other chronic pain: Secondary | ICD-10-CM | POA: Insufficient documentation

## 2023-02-07 DIAGNOSIS — R519 Headache, unspecified: Secondary | ICD-10-CM | POA: Insufficient documentation

## 2023-02-07 DIAGNOSIS — Z7901 Long term (current) use of anticoagulants: Secondary | ICD-10-CM | POA: Insufficient documentation

## 2023-02-07 DIAGNOSIS — G894 Chronic pain syndrome: Secondary | ICD-10-CM | POA: Insufficient documentation

## 2023-02-07 DIAGNOSIS — R52 Pain, unspecified: Secondary | ICD-10-CM | POA: Diagnosis present

## 2023-02-07 DIAGNOSIS — M542 Cervicalgia: Secondary | ICD-10-CM | POA: Diagnosis not present

## 2023-02-07 MED ORDER — ACETAMINOPHEN 325 MG PO TABS
650.0000 mg | ORAL_TABLET | Freq: Once | ORAL | Status: AC
  Administered 2023-02-07: 650 mg via ORAL
  Filled 2023-02-07: qty 2

## 2023-02-07 NOTE — Discharge Instructions (Signed)
Continue to take the extra strength Tylenol 2 tablets every 8 hours for your pain.  Make an appointment to follow-up with the wellness clinic.

## 2023-02-07 NOTE — ED Triage Notes (Signed)
Patient reports mild headache this evening , denies head injury , respirations unlabored /ambulatory .
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
## 2023-02-07 NOTE — ED Provider Triage Note (Signed)
Emergency Medicine Provider Triage Evaluation Note  Jamani Crane , a 77 y.o. female  was evaluated in triage.  Pt complains of diffuse body pain..  The patient is a 52nd visit in the last 6 months.  She apparently is also having blurry vision but reports that this has been going on for weeks and that she is currently trying to get money together to get a cab so she can get to an eye doctor.  She has no acute or neurologic deficits or new complaints today outside of her daily complaint of body pain  Review of Systems  Positive: Pain  Negative: Fever  Physical Exam  BP 137/70 (BP Location: Right Arm)   Pulse 76   Temp 98 F (36.7 C) (Oral)   Resp 16   SpO2 100%  Gen:   Awake, no distress   Resp:  Normal effort  MSK:   Moves extremities without difficulty  Other:  Extraocular movements intact, vision is grossly intact.  No visual field cuts.  PERRLA  Medical Decision Making  Medically screening exam initiated at 4:29 PM.  Appropriate orders placed.  Ecko Bangs was informed that the remainder of the evaluation will be completed by another provider, this initial triage assessment does not replace that evaluation, and the importance of remaining in the ED until their evaluation is complete.  Arthor Captain, PA-C 02/07/23 1631

## 2023-02-08 ENCOUNTER — Other Ambulatory Visit: Payer: Self-pay

## 2023-02-08 ENCOUNTER — Emergency Department (HOSPITAL_COMMUNITY)
Admission: EM | Admit: 2023-02-08 | Discharge: 2023-02-08 | Disposition: A | Discharge status: Home / Self Care | Payer: 59 | Attending: Emergency Medicine | Admitting: Emergency Medicine

## 2023-02-08 ENCOUNTER — Encounter (HOSPITAL_COMMUNITY): Payer: Self-pay | Admitting: Emergency Medicine

## 2023-02-08 DIAGNOSIS — Z7902 Long term (current) use of antithrombotics/antiplatelets: Secondary | ICD-10-CM | POA: Insufficient documentation

## 2023-02-08 DIAGNOSIS — W19XXXA Unspecified fall, initial encounter: Secondary | ICD-10-CM | POA: Diagnosis not present

## 2023-02-08 DIAGNOSIS — G894 Chronic pain syndrome: Secondary | ICD-10-CM | POA: Diagnosis not present

## 2023-02-08 DIAGNOSIS — M542 Cervicalgia: Secondary | ICD-10-CM | POA: Diagnosis present

## 2023-02-08 MED ORDER — NAPROXEN 250 MG PO TABS
375.0000 mg | ORAL_TABLET | Freq: Once | ORAL | Status: AC
  Administered 2023-02-08: 375 mg via ORAL
  Filled 2023-02-08: qty 2

## 2023-02-08 MED ORDER — NAPROXEN 250 MG PO TABS
500.0000 mg | ORAL_TABLET | Freq: Once | ORAL | Status: AC
  Administered 2023-02-08: 500 mg via ORAL
  Filled 2023-02-08: qty 2

## 2023-02-08 MED ORDER — NAPROXEN 375 MG PO TABS: 375.0000 mg | ORAL_TABLET | Freq: Two times a day (BID) | ORAL | 0 refills | Status: DC

## 2023-02-08 NOTE — ED Notes (Signed)
Placed pt in soft cervical collar per her request and she reports immediate decrease in neck pain. Pt a/o x 4.
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
## 2023-02-08 NOTE — Discharge Instructions (Signed)
 Follow up outpatient

## 2023-02-08 NOTE — Discharge Instructions (Signed)
You have chronic pain and continue taking Tylenol as needed  Follow-up with your doctor  Return to ER if you have severe neck pain or unable to walk
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
## 2023-02-11 ENCOUNTER — Other Ambulatory Visit: Payer: Self-pay

## 2023-02-11 ENCOUNTER — Emergency Department (HOSPITAL_COMMUNITY)
Admission: EM | Admit: 2023-02-11 | Discharge: 2023-02-11 | Disposition: A | Discharge status: Home / Self Care | Payer: 59 | Attending: Emergency Medicine | Admitting: Emergency Medicine

## 2023-02-11 ENCOUNTER — Emergency Department (HOSPITAL_COMMUNITY)
Admission: EM | Admit: 2023-02-11 | Discharge: 2023-02-12 | Discharge status: Against Medical Advice | Payer: 59 | Attending: Emergency Medicine | Admitting: Emergency Medicine

## 2023-02-11 ENCOUNTER — Encounter (HOSPITAL_COMMUNITY): Payer: Self-pay | Admitting: Emergency Medicine

## 2023-02-11 ENCOUNTER — Encounter (HOSPITAL_COMMUNITY): Payer: Self-pay

## 2023-02-11 DIAGNOSIS — R519 Headache, unspecified: Secondary | ICD-10-CM | POA: Diagnosis present

## 2023-02-11 DIAGNOSIS — M542 Cervicalgia: Secondary | ICD-10-CM | POA: Insufficient documentation

## 2023-02-11 DIAGNOSIS — Z5321 Procedure and treatment not carried out due to patient leaving prior to being seen by health care provider: Secondary | ICD-10-CM | POA: Diagnosis not present

## 2023-02-11 DIAGNOSIS — I10 Essential (primary) hypertension: Secondary | ICD-10-CM | POA: Insufficient documentation

## 2023-02-11 DIAGNOSIS — G8929 Other chronic pain: Secondary | ICD-10-CM | POA: Insufficient documentation

## 2023-02-11 DIAGNOSIS — E119 Type 2 diabetes mellitus without complications: Secondary | ICD-10-CM | POA: Diagnosis not present

## 2023-02-11 DIAGNOSIS — Z8673 Personal history of transient ischemic attack (TIA), and cerebral infarction without residual deficits: Secondary | ICD-10-CM | POA: Insufficient documentation

## 2023-02-11 DIAGNOSIS — Z79899 Other long term (current) drug therapy: Secondary | ICD-10-CM | POA: Diagnosis not present

## 2023-02-11 DIAGNOSIS — Z7984 Long term (current) use of oral hypoglycemic drugs: Secondary | ICD-10-CM | POA: Insufficient documentation

## 2023-02-11 MED ORDER — ACETAMINOPHEN 325 MG PO TABS: 650.0000 mg | ORAL_TABLET | Freq: Once | ORAL | Status: DC

## 2023-02-11 MED ORDER — NAPROXEN 250 MG PO TABS
250.0000 mg | ORAL_TABLET | Freq: Once | ORAL | Status: AC
  Administered 2023-02-11: 250 mg via ORAL
  Filled 2023-02-11: qty 1

## 2023-02-11 MED ORDER — AMLODIPINE BESYLATE 5 MG PO TABS
10.0000 mg | ORAL_TABLET | Freq: Once | ORAL | Status: AC
  Administered 2023-02-11: 10 mg via ORAL
  Filled 2023-02-11: qty 2
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
## 2023-02-11 NOTE — ED Triage Notes (Signed)
Pt BIB EMS from [facility] [place] with complaints of headache x 2 weeks as well as chronic neck pain. Pt forgot to put her neck brace n and requesting a new one. Pt states tylenol is not helping.

## 2023-02-11 NOTE — ED Notes (Signed)
Pt calling taxi at this time, will move to [facility] once they arrive

## 2023-02-11 NOTE — ED Triage Notes (Signed)
Pt checking back in for the 2nd time today with c/o headache. Pt was just discharged.
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
## 2023-02-11 NOTE — Discharge Instructions (Signed)
Continue home medications as prescribed.  Return to the emergency department for any new or worsening symptoms of concern. 

## 2023-02-13 ENCOUNTER — Emergency Department (HOSPITAL_COMMUNITY): Payer: 59

## 2023-02-13 ENCOUNTER — Other Ambulatory Visit: Payer: Self-pay

## 2023-02-13 ENCOUNTER — Emergency Department (HOSPITAL_COMMUNITY)
Admission: EM | Admit: 2023-02-13 | Discharge: 2023-02-13 | Disposition: A | Discharge status: Home / Self Care | Payer: 59 | Attending: Emergency Medicine | Admitting: Emergency Medicine

## 2023-02-13 ENCOUNTER — Encounter (HOSPITAL_COMMUNITY): Payer: Self-pay

## 2023-02-13 DIAGNOSIS — Z79899 Other long term (current) drug therapy: Secondary | ICD-10-CM | POA: Insufficient documentation

## 2023-02-13 DIAGNOSIS — W010XXA Fall on same level from slipping, tripping and stumbling without subsequent striking against object, initial encounter: Secondary | ICD-10-CM | POA: Diagnosis not present

## 2023-02-13 DIAGNOSIS — I10 Essential (primary) hypertension: Secondary | ICD-10-CM | POA: Diagnosis not present

## 2023-02-13 DIAGNOSIS — Z7902 Long term (current) use of antithrombotics/antiplatelets: Secondary | ICD-10-CM | POA: Diagnosis not present

## 2023-02-13 DIAGNOSIS — M25511 Pain in right shoulder: Secondary | ICD-10-CM | POA: Diagnosis not present

## 2023-02-13 DIAGNOSIS — Y9301 Activity, walking, marching and hiking: Secondary | ICD-10-CM | POA: Diagnosis not present

## 2023-02-13 DIAGNOSIS — M25561 Pain in right knee: Secondary | ICD-10-CM

## 2023-02-13 DIAGNOSIS — Z8673 Personal history of transient ischemic attack (TIA), and cerebral infarction without residual deficits: Secondary | ICD-10-CM | POA: Diagnosis not present

## 2023-02-13 DIAGNOSIS — Z7984 Long term (current) use of oral hypoglycemic drugs: Secondary | ICD-10-CM | POA: Diagnosis not present

## 2023-02-13 DIAGNOSIS — Y92510 Bank as the place of occurrence of the external cause: Secondary | ICD-10-CM | POA: Diagnosis not present

## 2023-02-13 DIAGNOSIS — E119 Type 2 diabetes mellitus without complications: Secondary | ICD-10-CM | POA: Insufficient documentation

## 2023-02-13 DIAGNOSIS — W19XXXA Unspecified fall, initial encounter: Secondary | ICD-10-CM

## 2023-02-13 MED ORDER — NAPROXEN 250 MG PO TABS
250.0000 mg | ORAL_TABLET | Freq: Once | ORAL | Status: AC
  Administered 2023-02-13: 250 mg via ORAL
  Filled 2023-02-13: qty 1

## 2023-02-13 MED ORDER — LIDOCAINE 5 % EX PTCH
1.0000 | MEDICATED_PATCH | CUTANEOUS | Status: DC
  Administered 2023-02-13: 1 via TRANSDERMAL
  Filled 2023-02-13: qty 1

## 2023-02-13 NOTE — ED Notes (Signed)
Patient ambulated this hallway unassisted with limp.

## 2023-02-13 NOTE — ED Provider Triage Note (Signed)
Emergency Medicine Provider Triage Evaluation Note  [name] , a 78 y.o. female  was evaluated in triage.  Pt complains of R shoulder and R knee pain [place] the [place]. Denies hitting her head .  Review of Systems  Positive: Knee pain, shoulder pain Negative: Headaches   Physical Exam  BP (!) 156/87 (BP Location: Right Arm)   Pulse 84   Temp 98.5 F (36.9 C) (Oral)   Resp 18   SpO2 100%  Gen:   Awake, no distress   Resp:  Normal effort  MSK:   Moves extremities without difficulty  Other:  Painful ROM of R shoulder and R knee  Medical Decision Making  Medically screening exam initiated at 2:49 PM.  Appropriate orders placed.  [name] was informed that the remainder of the evaluation will be completed by another provider, this initial triage assessment does not replace that evaluation, and the importance of remaining in the ED until their evaluation is complete.     Adriana Grandchild, MD 02/13/23 1450

## 2023-02-13 NOTE — Discharge Instructions (Addendum)
It was a pleasure caring for you today.  Please follow-up your primary care provider.  Seek emergency care if experiencing any new or worsening symptoms.  Alternating between 650 mg Tylenol and 400 mg Advil: The best way to alternate taking Acetaminophen (example Tylenol) and Ibuprofen (example Advil/Motrin) is to take them 3 hours apart. For example, if you take ibuprofen at 6 am you can then take Tylenol at 9 am. You can continue this regimen throughout the day, making sure you do not exceed the recommended maximum dose for each drug.
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
## 2023-02-13 NOTE — ED Notes (Signed)
Patient transported to X-ray 
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
## 2023-02-13 NOTE — ED Notes (Signed)
Patient transported to x-ray. ?

## 2023-02-15 ENCOUNTER — Emergency Department (HOSPITAL_COMMUNITY)
Admission: EM | Admit: 2023-02-15 | Discharge: 2023-02-15 | Disposition: A | Discharge status: Home / Self Care | Payer: 59 | Attending: Emergency Medicine | Admitting: Emergency Medicine

## 2023-02-15 ENCOUNTER — Encounter (HOSPITAL_COMMUNITY): Payer: Self-pay

## 2023-02-15 ENCOUNTER — Telehealth (HOSPITAL_COMMUNITY): Payer: Self-pay

## 2023-02-15 ENCOUNTER — Other Ambulatory Visit: Payer: Self-pay

## 2023-02-15 DIAGNOSIS — M7918 Myalgia, other site: Secondary | ICD-10-CM | POA: Diagnosis present

## 2023-02-15 DIAGNOSIS — Z7984 Long term (current) use of oral hypoglycemic drugs: Secondary | ICD-10-CM | POA: Diagnosis not present

## 2023-02-15 DIAGNOSIS — Z8673 Personal history of transient ischemic attack (TIA), and cerebral infarction without residual deficits: Secondary | ICD-10-CM | POA: Diagnosis not present

## 2023-02-15 DIAGNOSIS — E119 Type 2 diabetes mellitus without complications: Secondary | ICD-10-CM | POA: Diagnosis not present

## 2023-02-15 DIAGNOSIS — G8929 Other chronic pain: Secondary | ICD-10-CM

## 2023-02-15 DIAGNOSIS — I10 Essential (primary) hypertension: Secondary | ICD-10-CM | POA: Insufficient documentation

## 2023-02-15 MED ORDER — NAPROXEN 250 MG PO TABS: 500.0000 mg | ORAL_TABLET | Freq: Once | ORAL | Status: DC

## 2023-02-15 MED ORDER — ACETAMINOPHEN 325 MG PO TABS: 650.0000 mg | ORAL_TABLET | Freq: Once | ORAL | Status: DC
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
## 2023-02-15 NOTE — ED Triage Notes (Signed)
Pt c/o neck and pain all over that started a week ago. Denies recent falls

## 2023-02-15 NOTE — ED Notes (Signed)
Pt requesting taxi voucher due to not being able to see at night to get home alone

## 2023-02-15 NOTE — Care Management (Signed)
ED RNCM met patient in H22 Patient has had 56 ED visits 0 admissions. This patient is known to the ED [facility] staff for frequent visits. Assisted with finding a PCP several times, and HH services, but patient states she comes here because she know how to get here to [facility].  Discussed with patient  safety concerns surrounding leaving her home without assistance after her several unwitnessed falls.  Discussed patient current living conditions, she states she still lives at home alone, but disclosed that her water at home has been shut off. She states that it has been off for several months.  Patient then states,  "I am having pain in my right shoulder, but all they give me is tylenol" when asked how bad is the pain she said " it's pain!"  RNCM contacted daughter Freddi Starr [phone] to discuss patient and the safety concerns. Daughter began to yell and cry stating she is sick as well, she is unable to pick her up from ED, she does not drive. She asked me to help the patient get home safe. I attempted to explain that I discussed her mother with the ED SW and we will be filing an APS report due to major safety concern  living without running water and patient leaving home unassisted throughout the day and night. Daughter is agreeable.   Updated EDP and EDRN, patient is now  discharged.   Patient is requesting a taxi voucher home,  [employer] called and patient was transported to lobby via w/c.  Daughter was made aware.

## 2023-02-15 NOTE — Discharge Instructions (Addendum)
You need to establish care with a primary care doctor, make an appointment and talk with your provider about your chronic pain.

## 2023-02-15 NOTE — ED Provider Triage Note (Signed)
Emergency Medicine Provider Triage Evaluation Note  [name] , a 78 y.o. female  was evaluated in triage.  Pt complains of pain "all over" and pain in her neck that started about a week ago.  Denies any recent falls or trauma.  This is a common complaint from patient as she is well-known to the emergency department.  Denies any numbness or tingling in her upper or lower extremities  Review of Systems  Positive: As above Negative: As above  Physical Exam  Ht 5\' 4"  (1.626 m)   BMI 18.88 kg/m  Gen:   Awake, no distress   Resp:  Normal effort  MSK:   Moves extremities without difficulty    Medical Decision Making  Medically screening exam initiated at 1:06 PM.  Appropriate orders placed.  Jaquala Fuller was informed that the remainder of the evaluation will be completed by another provider, this initial triage assessment does not replace that evaluation, and the importance of remaining in the ED until their evaluation is complete.     Arabella Merles, PA-C 02/15/23 1308

## 2023-02-16 ENCOUNTER — Telehealth (HOSPITAL_COMMUNITY): Payer: Self-pay

## 2023-02-16 NOTE — Telephone Encounter (Signed)
CSW contacted [facility] APS to file a report due to patients frequent hospital visits and recent decline. Patient also stated she has no running water in her home. [name] with [place] [facility] completed the report and told CSW she would let her know if the case is screened in.

## 2023-02-17 ENCOUNTER — Encounter (HOSPITAL_COMMUNITY): Payer: Self-pay

## 2023-02-17 ENCOUNTER — Emergency Department (HOSPITAL_COMMUNITY)
Admission: EM | Admit: 2023-02-17 | Discharge: 2023-02-18 | Disposition: A | Discharge status: Home / Self Care | Payer: 59 | Attending: Emergency Medicine | Admitting: Emergency Medicine

## 2023-02-17 ENCOUNTER — Other Ambulatory Visit: Payer: Self-pay

## 2023-02-17 DIAGNOSIS — E119 Type 2 diabetes mellitus without complications: Secondary | ICD-10-CM | POA: Diagnosis not present

## 2023-02-17 DIAGNOSIS — G8929 Other chronic pain: Secondary | ICD-10-CM | POA: Insufficient documentation

## 2023-02-17 DIAGNOSIS — I10 Essential (primary) hypertension: Secondary | ICD-10-CM | POA: Diagnosis not present

## 2023-02-17 DIAGNOSIS — R030 Elevated blood-pressure reading, without diagnosis of hypertension: Secondary | ICD-10-CM

## 2023-02-17 NOTE — ED Triage Notes (Signed)
Patient complains of pain all over that is chronic since fall on city bus [place] long [place].  Has no new pain complaints.

## 2023-02-17 NOTE — ED Provider Triage Note (Signed)
Emergency Medicine Provider Triage Evaluation Note  [name] , a 78 y.o. female  was evaluated in triage.  Pt complains of chronic pain after [place] from city bus. States "everything been hurting. " Denies any new pain. Currently taking gabapentin, kenalog, voltaren, lidocaine, naproxen.   Denies fevers, chest pain, shortness of breath, numbness, tingling, weakness, dysuria.   Review of Systems  Positive: See above Negative: See above   Physical Exam  BP (!) 196/75 (BP Location: Right Arm)   Pulse 78   Temp 98.2 F (36.8 C)   Resp 16   Ht 5\' 4"  (1.626 m)   Wt 49.9 kg   SpO2 100%   BMI 18.88 kg/m  Gen:   Awake, no distress   Resp:  Normal effort  MSK:   Moves extremities without difficulty  Other:    Medical Decision Making  Medically screening exam initiated at 2:45 PM.  Appropriate orders placed.  [name] was informed that the remainder of the evaluation will be completed by another provider, this initial triage assessment does not replace that evaluation, and the importance of remaining in the ED until their evaluation is complete.     Lunette Stands, [place] 02/17/23 [phone]

## 2023-02-18 ENCOUNTER — Other Ambulatory Visit: Payer: Self-pay

## 2023-02-18 ENCOUNTER — Emergency Department (HOSPITAL_COMMUNITY)
Admission: EM | Admit: 2023-02-18 | Discharge: 2023-02-18 | Disposition: A | Discharge status: Home / Self Care | Payer: 59 | Source: Home / Self Care | Attending: Emergency Medicine | Admitting: Emergency Medicine

## 2023-02-18 ENCOUNTER — Encounter (HOSPITAL_COMMUNITY): Payer: Self-pay

## 2023-02-18 DIAGNOSIS — G8929 Other chronic pain: Secondary | ICD-10-CM | POA: Insufficient documentation

## 2023-02-18 MED ORDER — LIDOCAINE 5 % EX PTCH
1.0000 | MEDICATED_PATCH | CUTANEOUS | Status: DC
  Administered 2023-02-18: 1 via TRANSDERMAL
  Filled 2023-02-18: qty 1

## 2023-02-18 MED ORDER — LIDOCAINE 5 % EX PTCH
2.0000 | MEDICATED_PATCH | Freq: Once | CUTANEOUS | Status: DC
Start: 2023-02-18 — End: 2023-02-18
  Administered 2023-02-18: 2 via TRANSDERMAL
  Filled 2023-02-18: qty 2

## 2023-02-18 MED ORDER — LIDOCAINE 5 % EX PTCH: 1.0000 | MEDICATED_PATCH | CUTANEOUS | 2 refills | Status: DC

## 2023-02-18 NOTE — ED Provider Notes (Signed)
[place] EMERGENCY DEPARTMENT AT [facility] Provider Note   CSN: [id number] Arrival date & time: 02/17/23  1415     History  Chief Complaint  Patient presents with   Pain    [name] is a 78 y.o. female who presents the emergency department complaining of chronic pain.  Patient frequently takes gabapentin, Kenalog, Voltaren, lidocaine, naproxen.  She reports numerous falls on a city bus.  She is asking today for a refill of her lidocaine patches, as well as assistance with getting the city to help turn her water back on.  Of note patient is 46 ER visits in the past 6 months, usually for similar symptoms.  She denies any fevers, chest pain, shortness of breath, numbness, tingling, weakness, dysuria.  HPI     Home Medications Prior to Admission medications   Medication Sig Start Date End Date Taking? Authorizing Provider  acetaminophen (TYLENOL) 500 MG tablet Take 2 tablets (1,000 mg total) by mouth every 6 (six) hours as needed for moderate pain (pain score 4-6). 01/13/23   Fayrene Helper, PA-C  albuterol (VENTOLIN HFA) 108 (90 Base) MCG/ACT inhaler Inhale 1-2 puffs into the lungs every 6 (six) hours as needed for wheezing or shortness of breath. 04/22/20   Barbette Merino, NP  amLODipine (NORVASC) 10 MG tablet Take 1 tablet (10 mg total) by mouth daily. 01/23/23   Daijha Leggio T, PA-C  chlorthalidone (HYGROTON) 25 MG tablet TAKE 1 TABLET (25 MG TOTAL) BY MOUTH DAILY. 11/20/21 11/20/22  Donell Beers, FNP  clopidogrel (PLAVIX) 75 MG tablet Take 1 tablet (75 mg total) by mouth daily. 06/15/21   Elige Radon, MD  diclofenac Sodium (VOLTAREN) 1 % GEL Apply 2 g topically 4 (four) times daily. 01/13/23   Fayrene Helper, PA-C  diphenhydrAMINE (BENADRYL) 25 MG tablet Take 1 tablet (25 mg total) by mouth every 6 (six) hours as needed for itching or allergies. 10/03/22   Fayrene Helper, PA-C  gabapentin (NEURONTIN) 600 MG tablet Take 0.5 tablets (300 mg total) by mouth 3  (three) times daily. 10/27/22 12/26/22  Pricilla Loveless, MD  hydrocortisone cream 1 % Apply to affected area 2 times daily 10/22/22   Garlon Hatchet, PA-C  hydrOXYzine (ATARAX) 25 MG tablet Take 1 tablet (25 mg total) by mouth every 8 (eight) hours as needed. 11/23/22   Henderly, Britni A, PA-C  lidocaine (LIDODERM) 5 % Place 1 patch onto the skin daily. Remove & Discard patch within 12 hours or as directed by MD 02/18/23   Vadhir Mcnay T, PA-C  loratadine (CLARITIN) 10 MG tablet Take 1 tablet (10 mg total) by mouth daily. 10/17/22   Renne Crigler, PA-C  metFORMIN (GLUCOPHAGE) 500 MG tablet Take 1 tablet (500 mg total) by mouth 2 (two) times daily with a meal. 11/03/22 11/03/23  Gareth Eagle, PA-C  naproxen (NAPROSYN) 375 MG tablet Take 1 tablet (375 mg total) by mouth 2 (two) times daily. 02/08/23   Henderly, Britni A, PA-C  pantoprazole (PROTONIX) 20 MG tablet Take 1 tablet (20 mg total) by mouth daily. 10/10/21   [name], MD  rosuvastatin (CRESTOR) 10 MG tablet Take 1 tablet (10 mg total) by mouth at bedtime. 09/27/22 09/27/23  Rondel Baton, MD  triamcinolone cream (KENALOG) 0.1 % Apply 1 Application topically 2 (two) times daily. Apply to rash 09/27/22   Rondel Baton, MD      Allergies    Codeine, Ibuprofen, and Imdur [isosorbide nitrate]    Review of  Systems   Review of Systems  Musculoskeletal:  Positive for arthralgias.  All other systems reviewed and are negative.   Physical Exam Updated Vital Signs BP (!) 199/89 (BP Location: Right Arm)   Pulse 66   Temp 98 F (36.7 C) (Oral)   Resp 16   Ht 5\' 4"  (1.626 m)   Wt 49.9 kg   SpO2 100%   BMI 18.88 kg/m  Physical Exam Vitals and nursing note reviewed.  Constitutional:      Appearance: Normal appearance.  HENT:     Head: Normocephalic and atraumatic.  Eyes:     Conjunctiva/sclera: Conjunctivae normal.  Neck:     Comments: Patient wearing soft c-collar (has worn for months) Pulmonary:     Effort: Pulmonary  effort is normal. No respiratory distress.  Musculoskeletal:     Comments: Patient wearing left wrist brace (has worn for months)  Skin:    General: Skin is warm and dry.  Neurological:     Mental Status: She is alert.  Psychiatric:        Mood and Affect: Mood normal.        Behavior: Behavior normal.     ED Results / Procedures / Treatments   Labs (all labs ordered are listed, but only abnormal results are displayed) Labs Reviewed - No data to display  EKG None  Radiology No results found.  Procedures Procedures    Medications Ordered in ED Medications  lidocaine (LIDODERM) 5 % 2 patch (2 patches Transdermal Patch Applied 02/18/23 0805)    ED Course/ Medical Decision Making/ A&P                                 Medical Decision Making Risk Prescription drug management.   Patient is a 78 year old female with history of hypertension, type 2 diabetes, osteoarthritis, chronic headaches, cognitive impairment, prior stroke, who presents the emergency department complaining of chronic pain.  Patient mainly complaining of pain in her bilateral shoulders.  Reports numerous falls on a city bus.  Patient's been seen several times for similar symptoms, with 57 ER visits about 6 months.  Patient most recently seen on 1/24.  She presents today asking for a refill of her lidocaine patches.  Given her 2 in the ER, and refill sent to the pharmacy.  She was also asking for assistance with getting her water turned back on.  I reached out to ED social worker, who states that were not able to help with this but recommended that the patient follow-up with department of social services, contact information for the [facility] office has been provided.  No acute findings today.  Of note patient was very hypertensive, states she has not taken her blood pressure medication today or yesterday.  Encouraged her to do so. Does not have any symptoms associated with this. Patient discharged in stable  condition, all questions answered.  Final Clinical Impression(s) / ED Diagnoses Final diagnoses:  Other chronic pain  Elevated blood pressure reading    Rx / DC Orders ED Discharge Orders          Ordered    lidocaine (LIDODERM) 5 %  Every 24 hours        02/18/23 0802           Portions of this report may have been transcribed using voice recognition software. Every effort was made to ensure accuracy; however, inadvertent computerized transcription errors may be  present.    Jeanella Flattery 02/18/23 1610    Ernie Avena, MD 02/18/23 [phone]

## 2023-02-18 NOTE — ED Triage Notes (Signed)
Patient reports pain all over since she feel on the city bus.

## 2023-02-18 NOTE — Discharge Instructions (Addendum)
I tried to speak with our social workers about your water/utilities and they cannot help directly, but recommended you reach out to Department of Social Services, you can call them at this phone number: [phone]  I refilled your lidocaine patches at the pharmacy.  Please continue taking your blood pressure medication at home.

## 2023-02-18 NOTE — ED Provider Notes (Signed)
[place] EMERGENCY DEPARTMENT AT [facility] Provider Note   CSN: [id number] Arrival date & time: 02/18/23  1222     History  Chief Complaint  Patient presents with   Pain    [name] is a 78 y.o. female, who presents to the ED secondary to complaining of chronic pain.  She frequently visits the ER.  Has not had any recent falls.  Is requesting narcotics for chronic pain.  Has no other complaints.  Home Medications Prior to Admission medications   Medication Sig Start Date End Date Taking? Authorizing Provider  acetaminophen (TYLENOL) 500 MG tablet Take 2 tablets (1,000 mg total) by mouth every 6 (six) hours as needed for moderate pain (pain score 4-6). 01/13/23   Fayrene Helper, PA-C  albuterol (VENTOLIN HFA) 108 (90 Base) MCG/ACT inhaler Inhale 1-2 puffs into the lungs every 6 (six) hours as needed for wheezing or shortness of breath. 04/22/20   Barbette Merino, NP  amLODipine (NORVASC) 10 MG tablet Take 1 tablet (10 mg total) by mouth daily. 01/23/23   Roemhildt, Lorin T, PA-C  chlorthalidone (HYGROTON) 25 MG tablet TAKE 1 TABLET (25 MG TOTAL) BY MOUTH DAILY. 11/20/21 11/20/22  Donell Beers, FNP  clopidogrel (PLAVIX) 75 MG tablet Take 1 tablet (75 mg total) by mouth daily. 06/15/21   Elige Radon, MD  diclofenac Sodium (VOLTAREN) 1 % GEL Apply 2 g topically 4 (four) times daily. 01/13/23   Fayrene Helper, PA-C  diphenhydrAMINE (BENADRYL) 25 MG tablet Take 1 tablet (25 mg total) by mouth every 6 (six) hours as needed for itching or allergies. 10/03/22   Fayrene Helper, PA-C  gabapentin (NEURONTIN) 600 MG tablet Take 0.5 tablets (300 mg total) by mouth 3 (three) times daily. 10/27/22 12/26/22  Pricilla Loveless, MD  hydrocortisone cream 1 % Apply to affected area 2 times daily 10/22/22   Garlon Hatchet, PA-C  hydrOXYzine (ATARAX) 25 MG tablet Take 1 tablet (25 mg total) by mouth every 8 (eight) hours as needed. 11/23/22   Henderly, Britni A, PA-C  lidocaine (LIDODERM) 5 % Place  1 patch onto the skin daily. Remove & Discard patch within 12 hours or as directed by MD 02/18/23   Roemhildt, Lorin T, PA-C  loratadine (CLARITIN) 10 MG tablet Take 1 tablet (10 mg total) by mouth daily. 10/17/22   Renne Crigler, PA-C  metFORMIN (GLUCOPHAGE) 500 MG tablet Take 1 tablet (500 mg total) by mouth 2 (two) times daily with a meal. 11/03/22 11/03/23  Gareth Eagle, PA-C  naproxen (NAPROSYN) 375 MG tablet Take 1 tablet (375 mg total) by mouth 2 (two) times daily. 02/08/23   Henderly, Britni A, PA-C  pantoprazole (PROTONIX) 20 MG tablet Take 1 tablet (20 mg total) by mouth daily. 10/10/21   [name], MD  rosuvastatin (CRESTOR) 10 MG tablet Take 1 tablet (10 mg total) by mouth at bedtime. 09/27/22 09/27/23  Rondel Baton, MD  triamcinolone cream (KENALOG) 0.1 % Apply 1 Application topically 2 (two) times daily. Apply to rash 09/27/22   Rondel Baton, MD      Allergies    Codeine, Ibuprofen, and Imdur [isosorbide nitrate]    Review of Systems   Review of Systems  Musculoskeletal:  Positive for arthralgias.    Physical Exam Updated Vital Signs BP (!) 198/78 (BP Location: Right Arm)   Pulse 77   Temp 98.3 F (36.8 C) (Oral)   Resp 16   Ht 5\' 4"  (1.626 m)   Wt 49.9  kg   SpO2 99%   BMI 18.88 kg/m  Physical Exam Vitals and nursing note reviewed.  Constitutional:      General: She is not in acute distress.    Appearance: She is well-developed.  HENT:     Head: Normocephalic and atraumatic.  Eyes:     Conjunctiva/sclera: Conjunctivae normal.  Cardiovascular:     Rate and Rhythm: Normal rate and regular rhythm.     Heart sounds: No murmur heard. Pulmonary:     Effort: Pulmonary effort is normal. No respiratory distress.     Breath sounds: Normal breath sounds.  Abdominal:     Palpations: Abdomen is soft.     Tenderness: There is no abdominal tenderness.  Musculoskeletal:        General: No swelling.     Cervical back: Neck supple.     Comments: +neck brace  in place  Skin:    General: Skin is warm and dry.     Capillary Refill: Capillary refill takes less than 2 seconds.  Neurological:     Mental Status: She is alert.  Psychiatric:        Mood and Affect: Mood normal.     ED Results / Procedures / Treatments   Labs (all labs ordered are listed, but only abnormal results are displayed) Labs Reviewed - No data to display  EKG None  Radiology No results found.  Procedures Procedures    Medications Ordered in ED Medications  lidocaine (LIDODERM) 5 % 1 patch (has no administration in time range)    ED Course/ Medical Decision Making/ A&P                                 Medical Decision Making Patient is a 78 year old female, here for chronic pain.  She is requesting narcotics.  She has not had any recent falls.  She is overall well-appearing, and has no other acute complaint complaints.  She is requesting narcotics for her chronic pain.  I informed her, that we do not provide narcotics for chronic pain in the ER, and recommend that she follow-up with her PCP.  She is overall well-appearing in no acute distress.  Discharged home with strict return precautions  Risk Prescription drug management.    Final Clinical Impression(s) / ED Diagnoses Final diagnoses:  Other chronic pain    Rx / DC Orders ED Discharge Orders     None         Shalinda Burkholder, [place], PA 02/18/23 1320    Melene Plan, DO 02/18/23 1323

## 2023-02-18 NOTE — Discharge Planning (Signed)
RNCM met with pt at bedside regarding multiple visits.  Pt states RN spoke to her yesterday about it and tried to stay at home but she is in so much pain.  RNCM has set up PCP appointments for pt in the past and pt was no call/no show because she comes to ED instead.  RNCM offered to set up transportation and PCP appointment for pt; pt declines at this time.

## 2023-02-18 NOTE — Discharge Instructions (Signed)
Please follow-up with your primary care doctor, return to the ER if you have worsening symptoms.  Chronic pain is not an emergency, please follow-up with your PCP in regards to this.

## 2023-02-22 ENCOUNTER — Encounter (HOSPITAL_COMMUNITY): Payer: Self-pay

## 2023-02-22 ENCOUNTER — Other Ambulatory Visit: Payer: Self-pay

## 2023-02-22 ENCOUNTER — Emergency Department (HOSPITAL_COMMUNITY)
Admission: EM | Admit: 2023-02-22 | Discharge: 2023-02-22 | Disposition: A | Discharge status: Home / Self Care | Payer: 59 | Attending: Emergency Medicine | Admitting: Emergency Medicine

## 2023-02-22 DIAGNOSIS — E119 Type 2 diabetes mellitus without complications: Secondary | ICD-10-CM | POA: Insufficient documentation

## 2023-02-22 DIAGNOSIS — Z7984 Long term (current) use of oral hypoglycemic drugs: Secondary | ICD-10-CM | POA: Diagnosis not present

## 2023-02-22 DIAGNOSIS — Z79899 Other long term (current) drug therapy: Secondary | ICD-10-CM | POA: Diagnosis not present

## 2023-02-22 DIAGNOSIS — G894 Chronic pain syndrome: Secondary | ICD-10-CM | POA: Diagnosis not present

## 2023-02-22 DIAGNOSIS — I10 Essential (primary) hypertension: Secondary | ICD-10-CM | POA: Insufficient documentation

## 2023-02-22 DIAGNOSIS — M7918 Myalgia, other site: Secondary | ICD-10-CM | POA: Diagnosis present

## 2023-02-22 MED ORDER — LIDOCAINE 5 % EX PTCH
1.0000 | MEDICATED_PATCH | CUTANEOUS | Status: DC
  Administered 2023-02-22: 1 via TRANSDERMAL
  Filled 2023-02-22: qty 1

## 2023-02-22 NOTE — ED Triage Notes (Signed)
Pt reports pain all over since she fell on the city bus months ago. Recently to  ED for the same.

## 2023-02-22 NOTE — Discharge Instructions (Signed)
It was a pleasure caring for you today. Please follow up with primary care so that they can manage your chronic pain with better resources than what we have here in the ED. Seek emergency care if experiencing any new or worsening symptoms.
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
## 2023-02-24 ENCOUNTER — Encounter (HOSPITAL_COMMUNITY): Payer: Self-pay

## 2023-02-24 ENCOUNTER — Other Ambulatory Visit: Payer: Self-pay

## 2023-02-24 ENCOUNTER — Emergency Department (HOSPITAL_COMMUNITY)
Admission: EM | Admit: 2023-02-24 | Discharge: 2023-02-24 | Disposition: A | Discharge status: Home / Self Care | Payer: 59 | Source: Home / Self Care | Attending: Emergency Medicine | Admitting: Emergency Medicine

## 2023-02-24 ENCOUNTER — Emergency Department (HOSPITAL_COMMUNITY)
Admission: EM | Admit: 2023-02-24 | Discharge: 2023-02-24 | Disposition: A | Discharge status: Home / Self Care | Payer: 59 | Attending: Emergency Medicine | Admitting: Emergency Medicine

## 2023-02-24 DIAGNOSIS — G8929 Other chronic pain: Secondary | ICD-10-CM | POA: Insufficient documentation

## 2023-02-24 DIAGNOSIS — Z79899 Other long term (current) drug therapy: Secondary | ICD-10-CM | POA: Insufficient documentation

## 2023-02-24 DIAGNOSIS — E119 Type 2 diabetes mellitus without complications: Secondary | ICD-10-CM | POA: Insufficient documentation

## 2023-02-24 DIAGNOSIS — M791 Myalgia, unspecified site: Secondary | ICD-10-CM | POA: Insufficient documentation

## 2023-02-24 DIAGNOSIS — Z7984 Long term (current) use of oral hypoglycemic drugs: Secondary | ICD-10-CM | POA: Insufficient documentation

## 2023-02-24 DIAGNOSIS — Z7902 Long term (current) use of antithrombotics/antiplatelets: Secondary | ICD-10-CM | POA: Insufficient documentation

## 2023-02-24 DIAGNOSIS — I10 Essential (primary) hypertension: Secondary | ICD-10-CM | POA: Diagnosis not present

## 2023-02-24 DIAGNOSIS — R52 Pain, unspecified: Secondary | ICD-10-CM | POA: Insufficient documentation

## 2023-02-24 MED ORDER — LIDOCAINE 5 % EX PTCH
1.0000 | MEDICATED_PATCH | CUTANEOUS | Status: DC
  Administered 2023-02-24: 1 via TRANSDERMAL
  Filled 2023-02-24: qty 1

## 2023-02-24 NOTE — Discharge Instructions (Addendum)
 Please use Tylenol or ibuprofen for pain.  You may use 600 mg ibuprofen every 6 hours or 1000 mg of Tylenol every 6 hours.  You may choose to alternate between the 2.  This would be most effective.  Not to exceed 4 g of Tylenol within 24 hours.  Not to exceed 3200 mg ibuprofen 24 hours.

## 2023-02-24 NOTE — ED Triage Notes (Signed)
 Pt c.o chronic pain.
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
## 2023-02-24 NOTE — ED Triage Notes (Signed)
Pt arrived via POV c/o generalized body aches. 10/10 on pain scale. Pt would also like to speak to a Child psychotherapist and stay in lobby til morning after discharge
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
## 2023-02-28 ENCOUNTER — Other Ambulatory Visit: Payer: Self-pay

## 2023-02-28 ENCOUNTER — Encounter (HOSPITAL_COMMUNITY): Payer: Self-pay

## 2023-02-28 ENCOUNTER — Emergency Department (HOSPITAL_COMMUNITY)
Admission: EM | Admit: 2023-02-28 | Discharge: 2023-02-28 | Disposition: A | Discharge status: Home / Self Care | Payer: 59 | Attending: Student | Admitting: Student

## 2023-02-28 DIAGNOSIS — E119 Type 2 diabetes mellitus without complications: Secondary | ICD-10-CM | POA: Diagnosis not present

## 2023-02-28 DIAGNOSIS — G8929 Other chronic pain: Secondary | ICD-10-CM | POA: Diagnosis not present

## 2023-02-28 DIAGNOSIS — M7918 Myalgia, other site: Secondary | ICD-10-CM | POA: Insufficient documentation

## 2023-02-28 DIAGNOSIS — Z79899 Other long term (current) drug therapy: Secondary | ICD-10-CM | POA: Diagnosis not present

## 2023-02-28 DIAGNOSIS — Z7984 Long term (current) use of oral hypoglycemic drugs: Secondary | ICD-10-CM | POA: Insufficient documentation

## 2023-02-28 DIAGNOSIS — I10 Essential (primary) hypertension: Secondary | ICD-10-CM | POA: Diagnosis not present

## 2023-02-28 MED ORDER — GABAPENTIN 300 MG PO CAPS
400.0000 mg | ORAL_CAPSULE | Freq: Once | ORAL | Status: AC
  Administered 2023-02-28: 400 mg via ORAL
  Filled 2023-02-28: qty 1
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
## 2023-03-01 ENCOUNTER — Encounter (HOSPITAL_COMMUNITY): Payer: Self-pay | Admitting: *Deleted

## 2023-03-01 ENCOUNTER — Other Ambulatory Visit: Payer: Self-pay

## 2023-03-01 ENCOUNTER — Emergency Department (HOSPITAL_COMMUNITY)
Admission: EM | Admit: 2023-03-01 | Discharge: 2023-03-01 | Disposition: A | Discharge status: Home / Self Care | Payer: 59 | Attending: Emergency Medicine | Admitting: Emergency Medicine

## 2023-03-01 ENCOUNTER — Encounter (HOSPITAL_COMMUNITY): Payer: Self-pay

## 2023-03-01 ENCOUNTER — Emergency Department (HOSPITAL_COMMUNITY)
Admission: EM | Admit: 2023-03-01 | Discharge: 2023-03-02 | Disposition: A | Discharge status: Home / Self Care | Payer: 59 | Source: Home / Self Care | Attending: Emergency Medicine | Admitting: Emergency Medicine

## 2023-03-01 DIAGNOSIS — Z7982 Long term (current) use of aspirin: Secondary | ICD-10-CM | POA: Diagnosis not present

## 2023-03-01 DIAGNOSIS — G8929 Other chronic pain: Secondary | ICD-10-CM | POA: Insufficient documentation

## 2023-03-01 DIAGNOSIS — Z5321 Procedure and treatment not carried out due to patient leaving prior to being seen by health care provider: Secondary | ICD-10-CM | POA: Diagnosis not present

## 2023-03-01 DIAGNOSIS — M79605 Pain in left leg: Secondary | ICD-10-CM | POA: Diagnosis not present

## 2023-03-01 DIAGNOSIS — M79604 Pain in right leg: Secondary | ICD-10-CM | POA: Diagnosis present

## 2023-03-01 DIAGNOSIS — M791 Myalgia, unspecified site: Secondary | ICD-10-CM | POA: Insufficient documentation

## 2023-03-01 MED ORDER — LIDOCAINE 5 % EX PTCH
1.0000 | MEDICATED_PATCH | CUTANEOUS | Status: DC
  Administered 2023-03-01: 1 via TRANSDERMAL
  Filled 2023-03-01: qty 1

## 2023-03-01 MED ORDER — ACETAMINOPHEN 325 MG PO TABS
650.0000 mg | ORAL_TABLET | Freq: Once | ORAL | Status: AC
  Administered 2023-03-01: 650 mg via ORAL
  Filled 2023-03-01: qty 2

## 2023-03-01 MED ORDER — GABAPENTIN 300 MG PO CAPS
400.0000 mg | ORAL_CAPSULE | Freq: Once | ORAL | Status: AC
  Administered 2023-03-01: 400 mg via ORAL
  Filled 2023-03-01: qty 1

## 2023-03-01 MED ORDER — AMLODIPINE BESYLATE 5 MG PO TABS
10.0000 mg | ORAL_TABLET | Freq: Once | ORAL | Status: AC
  Administered 2023-03-01: 10 mg via ORAL
  Filled 2023-03-01 (×2): qty 2

## 2023-03-01 NOTE — Care Management (Signed)
 Called APS worker Christopher Ufot [phone] left message for return call. Patient is apparently living home without running water. Patient has had 58 ED visits in the past 6 months.  Patient has been in the ED daily for similar complaints. Have attempted several times to arrange appointments for follow -up patient refuses citing she only know how to travel to [facility] ER.

## 2023-03-01 NOTE — ED Provider Notes (Signed)
 [place] EMERGENCY DEPARTMENT AT [facility] Provider Note  CSN: [id number] Arrival date & time: 02/28/23 1721  Chief Complaint(s) Muscle Pain  HPI [name] is a 78 y.o. female who presents emerged department for evaluation of generalized muscle pain.  Patient is seen frequently here in the emergency department for her chronic pain syndrome.  Her symptoms are not different today than previous ER presentations.  Denies any new trauma or fall.   Past Medical History Past Medical History:  Diagnosis Date   Diabetes mellitus without complication (HCC)    GERD 09/04/2006   Qualifier: Diagnosis of  By: Lazaro Aquas     History of echocardiogram    a. 2D ECHO: 11/06/2013 EF 65%; no WMA. Mild TR. PA pk pressure 43 mm HG   Hypertension    Stroke [facility])    Patient Active Problem List   Diagnosis Date Noted   Cognitive impairment 06/16/2021   Chronic headache 06/16/2021   Healthcare maintenance 06/16/2021   Frequent patient in emergency department 06/16/2021   Cervical stenosis of spine 11/10/2019   Type 2 diabetes mellitus with hyperlipidemia (HCC) 06/20/2012   History of stroke 06/20/2012   Essential hypertension 09/04/2006   Osteoarthritis 09/04/2006   Home Medication(s) Prior to Admission medications   Medication Sig Start Date End Date Taking? Authorizing Provider  acetaminophen  (TYLENOL ) 500 MG tablet Take 2 tablets (1,000 mg total) by mouth every 6 (six) hours as needed for moderate pain (pain score 4-6). 01/13/23   [name], PA-C  albuterol  (VENTOLIN  HFA) 108 (90 Base) MCG/ACT inhaler Inhale 1-2 puffs into the lungs every 6 (six) hours as needed for wheezing or shortness of breath. 04/22/20   Myrna Camelia HERO, NP  amLODipine  (NORVASC ) 10 MG tablet Take 1 tablet (10 mg total) by mouth daily. 01/23/23   Roemhildt, Lorin T, PA-C  chlorthalidone  (HYGROTON ) 25 MG tablet TAKE 1 TABLET (25 MG TOTAL) BY MOUTH DAILY. 11/20/21 11/20/22  Paseda, Folashade R, FNP  clopidogrel   (PLAVIX ) 75 MG tablet Take 1 tablet (75 mg total) by mouth daily. 06/15/21   Sherlean Failing, MD  diclofenac  Sodium (VOLTAREN ) 1 % GEL Apply 2 g topically 4 (four) times daily. 01/13/23   [name], PA-C  diphenhydrAMINE  (BENADRYL ) 25 MG tablet Take 1 tablet (25 mg total) by mouth every 6 (six) hours as needed for itching or allergies. 10/03/22   [name], PA-C  gabapentin  (NEURONTIN ) 600 MG tablet Take 0.5 tablets (300 mg total) by mouth 3 (three) times daily. 10/27/22 12/26/22  Freddi Hamilton, MD  hydrocortisone  cream 1 % Apply to affected area 2 times daily 10/22/22   Jarold Olam HERO, PA-C  hydrOXYzine  (ATARAX ) 25 MG tablet Take 1 tablet (25 mg total) by mouth every 8 (eight) hours as needed. 11/23/22   Henderly, Britni A, PA-C  lidocaine  (LIDODERM ) 5 % Place 1 patch onto the skin daily. Remove & Discard patch within 12 hours or as directed by MD 02/18/23   Roemhildt, Lorin T, PA-C  loratadine  (CLARITIN ) 10 MG tablet Take 1 tablet (10 mg total) by mouth daily. 10/17/22   Geiple, Joshua, PA-C  metFORMIN  (GLUCOPHAGE ) 500 MG tablet Take 1 tablet (500 mg total) by mouth 2 (two) times daily with a meal. 11/03/22 11/03/23  Lang Norleen POUR, PA-C  naproxen  (NAPROSYN ) 375 MG tablet Take 1 tablet (375 mg total) by mouth 2 (two) times daily. 02/08/23   Henderly, Britni A, PA-C  pantoprazole  (PROTONIX ) 20 MG tablet Take 1 tablet (20 mg total) by mouth  daily. 10/10/21   Dean Clarity, MD  rosuvastatin  (CRESTOR ) 10 MG tablet Take 1 tablet (10 mg total) by mouth at bedtime. 09/27/22 09/27/23  Yolande Lamar BROCKS, MD  triamcinolone  cream (KENALOG ) 0.1 % Apply 1 Application topically 2 (two) times daily. Apply to rash 09/27/22   Yolande Lamar BROCKS, MD                                                                                                                                    Past Surgical History Past Surgical History:  Procedure Laterality Date   ABDOMINAL HYSTERECTOMY     ANTERIOR CERVICAL  DECOMPRESSION/DISCECTOMY FUSION 4 LEVELS N/A 11/11/2019   Procedure: Cervical three-four Cervical four-five Cervical five-six Cervical six-seven  Anterior cervical decompression/discectomy/fusion;  Surgeon: Unice Pac, MD;  Location: [facility] OR;  Service: Neurosurgery;  Laterality: N/A;   LEFT HEART CATHETERIZATION WITH CORONARY ANGIOGRAM N/A 11/05/2013   Procedure: LEFT HEART CATHETERIZATION WITH CORONARY ANGIOGRAM;  Surgeon: Debby DELENA Sor, MD;  Location: [facility] CATH LAB;  Service: Cardiovascular;  Laterality: N/A;   Family History History reviewed. No pertinent family history.  Social History Social History   Tobacco Use   Smoking status: Never   Smokeless tobacco: Never  Vaping Use   Vaping status: Never Used  Substance Use Topics   Alcohol  use: No   Drug use: No   Allergies Codeine, Ibuprofen , and Imdur [isosorbide nitrate]  Review of Systems Review of Systems  Musculoskeletal:  Positive for arthralgias and myalgias.    Physical Exam Vital Signs  I have reviewed the triage vital signs BP (!) 191/81 (BP Location: Left Arm)   Pulse 75   Temp 98.1 F (36.7 C) (Oral)   Resp 16   Ht 5' 4 (1.626 m)   Wt 49.4 kg   SpO2 99%   BMI 18.71 kg/m   Physical Exam Vitals and nursing note reviewed.  Constitutional:      General: She is not in acute distress.    Appearance: She is well-developed.  HENT:     Head: Normocephalic and atraumatic.  Eyes:     Conjunctiva/sclera: Conjunctivae normal.  Cardiovascular:     Rate and Rhythm: Normal rate and regular rhythm.     Heart sounds: No murmur heard. Pulmonary:     Effort: Pulmonary effort is normal. No respiratory distress.     Breath sounds: Normal breath sounds.  Abdominal:     Palpations: Abdomen is soft.     Tenderness: There is no abdominal tenderness.  Musculoskeletal:        General: Tenderness present. No swelling.     Cervical back: Neck supple.  Skin:    General: Skin is warm and dry.     Capillary Refill:  Capillary refill takes less than 2 seconds.  Neurological:     Mental Status: She is alert.  Psychiatric:        Mood and Affect: Mood normal.  ED Results and Treatments Labs (all labs ordered are listed, but only abnormal results are displayed) Labs Reviewed - No data to display                                                                                                                        Radiology No results found.  Pertinent labs & imaging results that were available during my care of the patient were reviewed by me and considered in my medical decision making (see MDM for details).  Medications Ordered in ED Medications  gabapentin  (NEURONTIN ) capsule 400 mg (400 mg Oral Given 02/28/23 2314)                                                                                                                                     Procedures Procedures  (including critical care time)  Medical Decision Making / ED Course   This patient presents to the ED for concern of muscle pain, this involves an extensive number of treatment options, and is a complaint that carries with it a high risk of complications and morbidity.  The differential diagnosis includes chronic pain, fibromyalgia, secondary gain, contusion, hematoma  MDM: Patient seen emergency room for evaluation of generalized muscle aches.  Physical exam largely unremarkable.  Patient given her home gabapentin  with symptomatic improvement.  Presentation consistent with her chronic pain syndrome.  There are no new symptoms today and additional ER workup was not pursued and likely would not change management and thus deferred. patient discharged with outpatient follow-up.   Additional history obtained:  -External records from outside source obtained and reviewed including: Chart review including previous notes, labs, imaging, consultation notes       Medicines ordered and prescription drug management: Meds ordered  this encounter  Medications   gabapentin  (NEURONTIN ) capsule 400 mg    -I have reviewed the patients home medicines and have made adjustments as needed  Critical interventions none    Social Determinants of Health:  Factors impacting patients care include: Frequent ED visits   Reevaluation: After the interventions noted above, I reevaluated the patient and found that they have :improved  Co morbidities that complicate the patient evaluation  Past Medical History:  Diagnosis Date   Diabetes mellitus without complication (HCC)    GERD 09/04/2006   Qualifier: Diagnosis of  By: Lazaro Aquas     History of echocardiogram  a. 2D ECHO: 11/06/2013 EF 65%; no WMA. Mild TR. PA pk pressure 43 mm HG   Hypertension    Stroke [facility])       Dispostion: I considered admission for this patient, but at this time she does not meet inpatient criteria for admission and will be discharged with outpatient follow-up     Final Clinical Impression(s) / ED Diagnoses Final diagnoses:  Other chronic pain     @PCDICTATION @    Albertina Dixon, MD 03/01/23 1213
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
## 2023-03-01 NOTE — ED Triage Notes (Signed)
 The pt reports she has a headache  for over a week she is in the ed almost every day she was just seen and discharged and she checked back in and she wants a cab voucher

## 2023-03-01 NOTE — ED Provider Triage Note (Signed)
 Emergency Medicine Provider Triage Evaluation Note  [name] , a 78 y.o. female  was evaluated in triage.  Pt complains of chronic pain.  Review of Systems  Positive: Pain, same as usual Negative:   Physical Exam  BP (!) 161/96 (BP Location: Right Arm)   Pulse 78   Temp 98.2 F (36.8 C)   Resp 18   SpO2 100%  Gen:   Awake, no distress   Resp:  Normal effort  MSK:   Moves extremities without difficulty  Other:    Medical Decision Making  Medically screening exam initiated at 8:05 PM.  Appropriate orders placed.  [name] was informed that the remainder of the evaluation will be completed by another provider, this initial triage assessment does not replace that evaluation, and the importance of remaining in the ED until their evaluation is complete.     Francis Ileana SAILOR, PA-C 03/01/23 2005

## 2023-03-01 NOTE — ED Triage Notes (Signed)
 Pt arrived from home via POV c/o chronic generalized body aches x years

## 2023-03-01 NOTE — Telephone Encounter (Signed)
 Returning call for community services for pt.

## 2023-03-01 NOTE — Discharge Instructions (Signed)
 Please take your home prescriptions for gabapentin  and Tylenol  to help with myalgias.

## 2023-03-02 ENCOUNTER — Emergency Department (HOSPITAL_COMMUNITY)
Admission: EM | Admit: 2023-03-02 | Discharge: 2023-03-02 | Discharge status: Against Medical Advice | Payer: 59 | Source: Home / Self Care

## 2023-03-02 ENCOUNTER — Other Ambulatory Visit: Payer: Self-pay

## 2023-03-02 ENCOUNTER — Emergency Department (HOSPITAL_COMMUNITY)
Admission: EM | Admit: 2023-03-02 | Discharge: 2023-03-02 | Disposition: A | Discharge status: Home / Self Care | Payer: 59 | Source: Home / Self Care | Attending: Emergency Medicine | Admitting: Emergency Medicine

## 2023-03-02 ENCOUNTER — Encounter (HOSPITAL_COMMUNITY): Payer: Self-pay

## 2023-03-02 DIAGNOSIS — Z5321 Procedure and treatment not carried out due to patient leaving prior to being seen by health care provider: Secondary | ICD-10-CM | POA: Insufficient documentation

## 2023-03-02 DIAGNOSIS — M79605 Pain in left leg: Secondary | ICD-10-CM | POA: Insufficient documentation

## 2023-03-02 DIAGNOSIS — M791 Myalgia, unspecified site: Secondary | ICD-10-CM | POA: Diagnosis not present

## 2023-03-02 DIAGNOSIS — M79604 Pain in right leg: Secondary | ICD-10-CM | POA: Insufficient documentation

## 2023-03-02 DIAGNOSIS — G8929 Other chronic pain: Secondary | ICD-10-CM | POA: Insufficient documentation

## 2023-03-02 MED ORDER — LIDOCAINE 5 % EX PTCH
1.0000 | MEDICATED_PATCH | CUTANEOUS | Status: DC
  Administered 2023-03-02: 1 via TRANSDERMAL
  Filled 2023-03-02: qty 1

## 2023-03-02 MED ORDER — DICLOFENAC SODIUM 1 % EX GEL: 2.0000 g | Freq: Four times a day (QID) | CUTANEOUS | 0 refills | Status: DC

## 2023-03-02 MED ORDER — ACETAMINOPHEN 325 MG PO TABS
650.0000 mg | ORAL_TABLET | Freq: Once | ORAL | Status: AC
  Administered 2023-03-02: 650 mg via ORAL
  Filled 2023-03-02: qty 2

## 2023-03-02 NOTE — ED Notes (Signed)
 Attempted to call daughter who is emergency contact to come and get here with no answer.

## 2023-03-02 NOTE — ED Provider Notes (Signed)
  [facility]EMERGENCY DEPT [facility] Emergency Department Provider Note MRN:  [id number]  Arrival date & time: 03/02/23     Chief Complaint   Headache   History of Present Illness   [name] is a 78 y.o. year-old female presents to the ED with chief complaint of needing a taxi pass.  She initially came in because of her chronic pain symptoms in her head, arm, and leg.  She states that she doesn't have a PCP and can't go very far, so she comes to the ER to be seen for her problems.  She is well known here and has been seen 59 times in the last 6 months.  History provided by patient.   Review of Systems  Pertinent positive and negative review of systems noted in HPI.    Physical Exam   Vitals:   03/02/23 0532  BP: (!) 169/62  Pulse: 76  Resp: 15  Temp: 97.8 F (36.6 C)  SpO2: 100%    CONSTITUTIONAL:  non toxic-appearing, NAD NEURO:  Alert and oriented x 3, CN 3-12 grossly intact EYES:  eyes equal and reactive ENT/NECK:  Supple, no stridor  CARDIO:  normal rate, regular rhythm, appears well-perfused  PULM:  No respiratory distress, CTAB GI/GU:  non-distended,  MSK/SPINE:  No gross deformities, no edema, moves all extremities  SKIN:  no rash, atraumatic   *Additional and/or pertinent findings included in MDM below  Diagnostic and Interventional Summary    EKG Interpretation Date/Time:    Ventricular Rate:    PR Interval:    QRS Duration:    QT Interval:    QTC Calculation:   R Axis:      Text Interpretation:         Labs Reviewed - No data to display  No orders to display    Medications - No data to display   Procedures  /  Critical Care Procedures  ED Course and Medical Decision Making  I have reviewed the triage vital signs, the nursing notes, and pertinent available records from the EMR.  Social Determinants Affecting Complexity of Care: Patient has no clinically significant social determinants affecting this chief complaint..   ED  Course:    Medical Decision Making Patient appears in her normal state of health.  She is requesting pain medication and transportation home.  No new acute/emergent problems identified tonight.  Feel that patient is stable for discharge home.         Consultants: No consultations were needed in caring for this patient.   Treatment and Plan: Emergency department workup does not suggest an emergent condition requiring admission or immediate intervention beyond  what has been performed at this time. The patient is safe for discharge and has  been instructed to return immediately for worsening symptoms, change in  symptoms or any other concerns    Final Clinical Impressions(s) / ED Diagnoses     ICD-10-CM   1. Other chronic pain  G89.29       ED Discharge Orders     None         Discharge Instructions Discussed with and Provided to Patient:   Discharge Instructions   None      Vicky Charleston, PA-C 03/02/23 0534    Jerrol Agent, MD 03/02/23 [phone]

## 2023-03-02 NOTE — ED Notes (Signed)
 Pt leaving AMA "I don't want to stay here if I have to do all this".

## 2023-03-02 NOTE — Discharge Instructions (Addendum)
 Take Tylenol  as you need to go home.  I have sent Voltaren  gel into the pharmacy for you.

## 2023-03-02 NOTE — ED Notes (Signed)
 [facility] called and report filed by this RN

## 2023-03-02 NOTE — ED Notes (Signed)
 Spoke with daughter. Daughter spoke with patient and tried to tell her not to come to hospital and needs to follow up with primary and patient states "[facility] is my hospital and if I'm in pain I'm coming to the hospital".

## 2023-03-02 NOTE — ED Triage Notes (Signed)
 Patient reports pain at both legs this morning , no injury/ambulatory .

## 2023-03-02 NOTE — ED Provider Notes (Signed)
 [place] [facility]ERGENCY DEPARTMENT AT [facility] Provider Note   CSN: [id number] Arrival date & time: 03/02/23  1824     History  Chief Complaint  Patient presents with   Pain    [name] is a 78 y.o. female.  78 year old female presents today for concern of chronic pain.  She has had 4-5 visits in the past 24 hours.  She is well-known to the emergency department.  Denies any new complaints.  She states she is looking for a pain pill.  She states she has taken Tylenol  at home without significant relief.  Has not tried anything else.  The history is provided by the patient. No language interpreter was used.       Home Medications Prior to Admission medications   Medication Sig Start Date End Date Taking? Authorizing Provider  acetaminophen  (TYLENOL ) 500 MG tablet Take 2 tablets (1,000 mg total) by mouth every 6 (six) hours as needed for moderate pain (pain score 4-6). 01/13/23   [name], PA-C  albuterol  (VENTOLIN  HFA) 108 (90 Base) MCG/ACT inhaler Inhale 1-2 puffs into the lungs every 6 (six) hours as needed for wheezing or shortness of breath. 04/22/20   Myrna Camelia HERO, NP  amLODipine  (NORVASC ) 10 MG tablet Take 1 tablet (10 mg total) by mouth daily. 01/23/23   Roemhildt, Lorin T, PA-C  chlorthalidone  (HYGROTON ) 25 MG tablet TAKE 1 TABLET (25 MG TOTAL) BY MOUTH DAILY. 11/20/21 11/20/22  Paseda, Folashade R, FNP  clopidogrel  (PLAVIX ) 75 MG tablet Take 1 tablet (75 mg total) by mouth daily. 06/15/21   Sherlean Failing, MD  diclofenac  Sodium (VOLTAREN ) 1 % GEL Apply 2 g topically 4 (four) times daily. 03/02/23   Hildegard, Michaelangelo Mittelman, PA-C  diphenhydrAMINE  (BENADRYL ) 25 MG tablet Take 1 tablet (25 mg total) by mouth every 6 (six) hours as needed for itching or allergies. 10/03/22   [name], PA-C  gabapentin  (NEURONTIN ) 600 MG tablet Take 0.5 tablets (300 mg total) by mouth 3 (three) times daily. 10/27/22 12/26/22  Freddi Hamilton, MD  hydrocortisone  cream 1 % Apply to affected area 2  times daily 10/22/22   Jarold Olam HERO, PA-C  hydrOXYzine  (ATARAX ) 25 MG tablet Take 1 tablet (25 mg total) by mouth every 8 (eight) hours as needed. 11/23/22   Henderly, Britni A, PA-C  lidocaine  (LIDODERM ) 5 % Place 1 patch onto the skin daily. Remove & Discard patch within 12 hours or as directed by MD 02/18/23   Roemhildt, Lorin T, PA-C  loratadine  (CLARITIN ) 10 MG tablet Take 1 tablet (10 mg total) by mouth daily. 10/17/22   Geiple, Joshua, PA-C  metFORMIN  (GLUCOPHAGE ) 500 MG tablet Take 1 tablet (500 mg total) by mouth 2 (two) times daily with a meal. 11/03/22 11/03/23  Lang Norleen POUR, PA-C  naproxen  (NAPROSYN ) 375 MG tablet Take 1 tablet (375 mg total) by mouth 2 (two) times daily. 02/08/23   Henderly, Britni A, PA-C  pantoprazole  (PROTONIX ) 20 MG tablet Take 1 tablet (20 mg total) by mouth daily. 10/10/21   Dean Clarity, MD  triamcinolone  cream (KENALOG ) 0.1 % Apply 1 Application topically 2 (two) times daily. Apply to rash 09/27/22   Yolande Lamar BROCKS, MD      Allergies    Codeine, Ibuprofen , and Imdur [isosorbide nitrate]    Review of Systems   Review of Systems  Constitutional:  Negative for chills and fever.  Musculoskeletal:  Positive for arthralgias.  All other systems reviewed and are negative.   Physical Exam Updated  Vital Signs BP (!) 193/85 (BP Location: Right Arm)   Pulse 81   Temp 98.3 F (36.8 C) (Oral)   Resp 15   Ht 5' 4 (1.626 m)   Wt 49 kg   SpO2 100%   BMI 18.54 kg/m  Physical Exam Vitals and nursing note reviewed.  Constitutional:      General: She is not in acute distress.    Appearance: Normal appearance. She is not ill-appearing.  HENT:     Head: Normocephalic and atraumatic.     Nose: Nose normal.  Eyes:     Conjunctiva/sclera: Conjunctivae normal.  Neck:     Comments: Soft collar in place Cardiovascular:     Rate and Rhythm: Normal rate.  Pulmonary:     Effort: Pulmonary effort is normal. No respiratory distress.  Musculoskeletal:         General: No deformity. Normal range of motion.  Skin:    Findings: No rash.  Neurological:     Mental Status: She is alert.     ED Results / Procedures / Treatments   Labs (all labs ordered are listed, but only abnormal results are displayed) Labs Reviewed - No data to display  EKG None  Radiology No results found.  Procedures Procedures    Medications Ordered in ED Medications  acetaminophen  (TYLENOL ) tablet 650 mg (has no administration in time range)  lidocaine  (LIDODERM ) 5 % 1 patch (has no administration in time range)    ED Course/ Medical Decision Making/ A&P                                 Medical Decision Making Risk OTC drugs. Prescription drug management.   78 year old female who is well-known to this emergency department has had 61 visits in the past 6 months presents today for concern of chronic pain.  She is requesting a pain pill.  Denies any new complaints.  Relates her story of [place] city [place] that brought on her pain to begin with.  Hemodynamically stable.  She is appropriate for discharge.  Discharged in stable condition.   Final Clinical Impression(s) / ED Diagnoses Final diagnoses:  Other chronic pain    Rx / DC Orders ED Discharge Orders          Ordered    diclofenac  Sodium (VOLTAREN ) 1 % GEL  4 times daily        03/02/23 2227              Hildegard Loge, PA-C 03/02/23 2255    Yolande Lamar BROCKS, MD 03/04/23 [phone]
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
## 2023-03-05 ENCOUNTER — Telehealth: Payer: Self-pay | Admitting: Surgery

## 2023-03-05 NOTE — Telephone Encounter (Signed)
ED RNCM reach out  again to DSS worker Maple Hudson, patient noted to be back in the ED for similar complaints ED visits 58 within 6months without any admsission, she discharged home.Patient is living in home without running water.  ED TOC  will continue to reach out to APS.

## 2023-03-07 ENCOUNTER — Telehealth: Payer: Self-pay | Admitting: *Deleted
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
## 2023-03-08 ENCOUNTER — Emergency Department (HOSPITAL_COMMUNITY): Payer: 59

## 2023-03-08 ENCOUNTER — Encounter (HOSPITAL_COMMUNITY): Payer: Self-pay

## 2023-03-08 ENCOUNTER — Other Ambulatory Visit: Payer: Self-pay

## 2023-03-08 ENCOUNTER — Emergency Department (HOSPITAL_COMMUNITY)
Admission: EM | Admit: 2023-03-08 | Discharge: 2023-03-08 | Disposition: A | Discharge status: Home / Self Care | Payer: 59

## 2023-03-08 DIAGNOSIS — M791 Myalgia, unspecified site: Secondary | ICD-10-CM | POA: Diagnosis not present

## 2023-03-08 DIAGNOSIS — S59911A Unspecified injury of right forearm, initial encounter: Secondary | ICD-10-CM | POA: Diagnosis present

## 2023-03-08 DIAGNOSIS — I10 Essential (primary) hypertension: Secondary | ICD-10-CM | POA: Diagnosis not present

## 2023-03-08 DIAGNOSIS — Z7984 Long term (current) use of oral hypoglycemic drugs: Secondary | ICD-10-CM | POA: Diagnosis not present

## 2023-03-08 DIAGNOSIS — Z79899 Other long term (current) drug therapy: Secondary | ICD-10-CM | POA: Insufficient documentation

## 2023-03-08 DIAGNOSIS — W19XXXA Unspecified fall, initial encounter: Secondary | ICD-10-CM | POA: Diagnosis not present

## 2023-03-08 DIAGNOSIS — S5011XA Contusion of right forearm, initial encounter: Secondary | ICD-10-CM | POA: Insufficient documentation

## 2023-03-08 DIAGNOSIS — I2510 Atherosclerotic heart disease of native coronary artery without angina pectoris: Secondary | ICD-10-CM | POA: Insufficient documentation

## 2023-03-08 DIAGNOSIS — Z7902 Long term (current) use of antithrombotics/antiplatelets: Secondary | ICD-10-CM | POA: Insufficient documentation

## 2023-03-08 MED ORDER — LIDOCAINE 5 % EX PTCH: 1.0000 | MEDICATED_PATCH | CUTANEOUS | 0 refills | Status: DC

## 2023-03-08 MED ORDER — DICLOFENAC SODIUM 1 % EX GEL: 4.0000 g | Freq: Four times a day (QID) | CUTANEOUS | 0 refills | Status: DC

## 2023-03-08 MED ORDER — KETOROLAC TROMETHAMINE 15 MG/ML IJ SOLN
15.0000 mg | Freq: Once | INTRAMUSCULAR | Status: AC
  Administered 2023-03-08: 15 mg via INTRAMUSCULAR
  Filled 2023-03-08: qty 1

## 2023-03-08 MED ORDER — LIDOCAINE 5 % EX PTCH
1.0000 | MEDICATED_PATCH | CUTANEOUS | Status: DC
  Administered 2023-03-08: 1 via TRANSDERMAL
  Filled 2023-03-08: qty 1
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
## 2023-03-08 NOTE — ED Triage Notes (Signed)
Pt c/o pain all over. Pt states she can't take the tylenol the doctor told her to take.
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
## 2023-03-08 NOTE — Care Management (Signed)
ED RNCM left message for Maple Hudson  [phone] that patient is in the ED.

## 2023-03-08 NOTE — Progress Notes (Addendum)
2:30pm: CSW received call from Maple Hudson at DSS who states he attempted to meet with patient at her home yesterday and her granddaughter was present. Dorene Sorrow states patient refused to allow him into her home and told him to leave. Dorene Sorrow states that his supervisor requested he returned to patient's home this evening between 6pm and 7pm to determine if she will grant him entry into the home and to see if patient is experiencing sundowning symptoms.  2:25pm: CSW spoke with [place] at [facility] to request information on the assigned social worker for this patient. Patsy Lager states she will notify team and have social worker to call CSW.  Edwin Dada, MSW, LCSW Transitions of Care  Clinical Social Worker II [phone]

## 2023-03-08 NOTE — Discharge Instructions (Addendum)
Your x-ray does not show any concerning findings.  Use the lidocaine patches and Voltaren gel.  Follow-up with your doctor.  Return to the ER for worsening symptoms.

## 2023-03-09 ENCOUNTER — Encounter (HOSPITAL_COMMUNITY): Payer: Self-pay

## 2023-03-09 ENCOUNTER — Emergency Department (HOSPITAL_COMMUNITY)
Admission: EM | Admit: 2023-03-09 | Discharge: 2023-03-09 | Disposition: A | Discharge status: Home / Self Care | Payer: 59 | Attending: Emergency Medicine | Admitting: Emergency Medicine

## 2023-03-09 DIAGNOSIS — M791 Myalgia, unspecified site: Secondary | ICD-10-CM | POA: Diagnosis present

## 2023-03-09 DIAGNOSIS — M79604 Pain in right leg: Secondary | ICD-10-CM | POA: Insufficient documentation

## 2023-03-09 DIAGNOSIS — M79605 Pain in left leg: Secondary | ICD-10-CM | POA: Insufficient documentation

## 2023-03-09 DIAGNOSIS — Y92811 Bus as the place of occurrence of the external cause: Secondary | ICD-10-CM | POA: Insufficient documentation

## 2023-03-09 DIAGNOSIS — M79602 Pain in left arm: Secondary | ICD-10-CM | POA: Diagnosis not present

## 2023-03-09 DIAGNOSIS — R52 Pain, unspecified: Secondary | ICD-10-CM

## 2023-03-09 DIAGNOSIS — W19XXXA Unspecified fall, initial encounter: Secondary | ICD-10-CM | POA: Insufficient documentation

## 2023-03-09 DIAGNOSIS — M79601 Pain in right arm: Secondary | ICD-10-CM | POA: Diagnosis not present

## 2023-03-09 NOTE — Discharge Instructions (Addendum)
You were seen today for generalized bodyaches.  Your physical exam was very reassuring. Low suspicion for any acute emergent changes at this time.  Sadly these things are probably most likely due to chronic pain and do not appear to be pain that we can treat actively within the ER.  I recommend that you follow-up with a primary care so that they can best manage and treat your pain and take the time necessary to do so.  Due to your kidney function being lower when last taken, I do not believe that we can give you any more of the Toradol or ibuprofen.  Recommend that you continue to take Tylenol for this pain.  Take Tylenol (acetominophen)  650mg  every 4-6 hours, as needed for pain or fever. Do not take more than 4,000 mg in a 24-hour period. As this may cause liver damage. While this is rare, if you begin to develop yellowing of the skin or eyes, stop taking and return to ER immediately.

## 2023-03-09 NOTE — Progress Notes (Signed)
CSW spoke with Lucila Maine from [place] [facility]. Feliz Beam stated she will send  patients case worker Maple Hudson and his supervisor an email to make them aware that patient still reports not having water in her home and the multiple visits to the ED.

## 2023-03-09 NOTE — ED Provider Notes (Signed)
[place] EMERGENCY DEPARTMENT AT [facility] Provider Note   CSN: [id number] Arrival date & time: 03/09/23  0119     History  Chief Complaint  Patient presents with   [name]     [name] is a 78 y.o. female.  HPI Patient is a 78 year old female presents ED today complaining of bilateral arm and leg pain after a fall from a bus.  Was previously seen yesterday for same complaint where she had been particularly worried about her right forearm.  And has been seen many times for the same complaint with the last 6 months.  X-rays were obtained were negative. Patient was provided Toradol and given lidocaine patch however patient says that pain is still prevalent and that the patch did not help.  However also reports that she does not believe that she was seen yesterday and did not come to the hospital.    Home Medications Prior to Admission medications   Medication Sig Start Date End Date Taking? Authorizing Provider  acetaminophen (TYLENOL) 500 MG tablet Take 2 tablets (1,000 mg total) by mouth every 6 (six) hours as needed for moderate pain (pain score 4-6). 01/13/23   Fayrene Helper, PA-C  albuterol (VENTOLIN HFA) 108 (90 Base) MCG/ACT inhaler Inhale 1-2 puffs into the lungs every 6 (six) hours as needed for wheezing or shortness of breath. 04/22/20   Barbette Merino, NP  amLODipine (NORVASC) 10 MG tablet Take 1 tablet (10 mg total) by mouth daily. 01/23/23   Roemhildt, Lorin T, PA-C  chlorthalidone (HYGROTON) 25 MG tablet TAKE 1 TABLET (25 MG TOTAL) BY MOUTH DAILY. 11/20/21 11/20/22  Donell Beers, FNP  clopidogrel (PLAVIX) 75 MG tablet Take 1 tablet (75 mg total) by mouth daily. 06/15/21   Elige Radon, MD  diclofenac Sodium (VOLTAREN) 1 % GEL Apply 2 g topically 4 (four) times daily. 03/02/23   [name], PA-C  diclofenac Sodium (VOLTAREN) 1 % GEL Apply 4 g topically 4 (four) times daily. 03/08/23   Durwin Glaze, MD  diphenhydrAMINE (BENADRYL) 25 MG tablet Take 1  tablet (25 mg total) by mouth every 6 (six) hours as needed for itching or allergies. 10/03/22   Fayrene Helper, PA-C  gabapentin (NEURONTIN) 600 MG tablet Take 0.5 tablets (300 mg total) by mouth 3 (three) times daily. 10/27/22 12/26/22  Pricilla Loveless, MD  hydrocortisone cream 1 % Apply to affected area 2 times daily 10/22/22   Garlon Hatchet, PA-C  hydrOXYzine (ATARAX) 25 MG tablet Take 1 tablet (25 mg total) by mouth every 8 (eight) hours as needed. 11/23/22   Henderly, Britni A, PA-C  lidocaine (LIDODERM) 5 % Place 1 patch onto the skin daily. Remove & Discard patch within 12 hours or as directed by MD 02/18/23   Roemhildt, Lorin T, PA-C  lidocaine (LIDODERM) 5 % Place 1 patch onto the skin daily. Remove & Discard patch within 12 hours or as directed by MD 03/08/23   Durwin Glaze, MD  loratadine (CLARITIN) 10 MG tablet Take 1 tablet (10 mg total) by mouth daily. 10/17/22   Renne Crigler, PA-C  metFORMIN (GLUCOPHAGE) 500 MG tablet Take 1 tablet (500 mg total) by mouth 2 (two) times daily with a meal. 11/03/22 11/03/23  Gareth Eagle, PA-C  naproxen (NAPROSYN) 375 MG tablet Take 1 tablet (375 mg total) by mouth 2 (two) times daily. 02/08/23   Henderly, Britni A, PA-C  pantoprazole (PROTONIX) 20 MG tablet Take 1 tablet (20 mg total) by mouth daily.  10/10/21   [name], MD  triamcinolone cream (KENALOG) 0.1 % Apply 1 Application topically 2 (two) times daily. Apply to rash 09/27/22   Rondel Baton, MD      Allergies    Codeine, Ibuprofen, and Imdur [isosorbide nitrate]    Review of Systems   Review of Systems  Musculoskeletal:  Positive for arthralgias and myalgias.  All other systems reviewed and are negative.   Physical Exam Updated Vital Signs BP (!) 194/80 (BP Location: Right Arm)   Pulse 78   Temp 97.8 F (36.6 C)   Resp 14   SpO2 99%  Physical Exam Vitals and nursing note reviewed.  Constitutional:      Appearance: Normal appearance.  HENT:     Head: Normocephalic and  atraumatic.  Eyes:     General: No scleral icterus.       Right eye: No discharge.        Left eye: No discharge.     Extraocular Movements: Extraocular movements intact.     Conjunctiva/sclera: Conjunctivae normal.  Cardiovascular:     Rate and Rhythm: Normal rate and regular rhythm.     Pulses: Normal pulses.     Heart sounds: Normal heart sounds. No murmur heard.    No friction rub. No gallop.  Pulmonary:     Effort: Pulmonary effort is normal. No respiratory distress.     Breath sounds: Normal breath sounds.  Abdominal:     General: Abdomen is flat.     Palpations: Abdomen is soft.     Tenderness: There is no abdominal tenderness.  Musculoskeletal:        General: No swelling, tenderness, deformity or signs of injury.     Right lower leg: No edema.     Left lower leg: No edema.  Skin:    General: Skin is warm and dry.     Capillary Refill: Capillary refill takes less than 2 seconds.     Coloration: Skin is not pale.     Findings: No bruising or erythema.  Neurological:     General: No focal deficit present.     Mental Status: She is alert and oriented to person, place, and time. Mental status is at baseline.     Motor: No weakness.     Gait: Gait normal.  Psychiatric:     Comments: Patient appears to be going back and forth on her story of having been seen yesterday saying that she had not been seen but reports that the lidocaine and Toradol that they gave her was not helpful.     ED Results / Procedures / Treatments   Labs (all labs ordered are listed, but only abnormal results are displayed) Labs Reviewed - No data to display  EKG None  Radiology DG Forearm Right Result Date: 03/08/2023 CLINICAL DATA:  Fall, right arm pain EXAM: RIGHT FOREARM - 2 VIEW COMPARISON:  None Available. FINDINGS: There is no evidence of fracture or other focal bone lesions. Soft tissues are unremarkable. IMPRESSION: Negative. Electronically Signed   By: Helyn Numbers M.D.   On:  03/08/2023 22:59    Procedures Procedures    Medications Ordered in ED Medications - No data to display  ED Course/ Medical Decision Making/ A&P                                 Medical Decision Making This patient is a 78 year old female who  presents to the ED for concern of bilateral arm and leg pain after a fall from a bus.   Differential diagnoses prior to evaluation: Malingering, fracture, arthritis, infection  Past Medical History / Social History / Additional history: Chart reviewed. Pertinent results include: Diabetes, stroke, hypertension, GERD Previous cardiac catheterization in 2015. Anticoagulated on Plavix.   Was previously seen yesterday for a fall where she was complaining of right forearm pain after saying she fell from a bus 1 week ago.  Also reported generalized bodyaches but appeared to be chronic at that time.  Was provided Toradol and lidocaine patch and discharged.    Has had 61 ED visits within the last 6 months.  Medications / Treatment: No medications or treatment needed at this time.  ED course:  Patient is a 78 year old female presents ED today complaining of bilateral arm and leg pain after a fall from a bus.  Was previously seen yesterday for same complaint where she had been particularly worried about her right forearm.  x-rays were obtained were negative. Patient was provided Toradol and given lidocaine patch however patient says that pain is still prevalent and that the patch did not help.  However also reports that she does not believe that she was seen yesterday and did not come to the hospital.  Patient was noted to have immediately rechecked and after being discharged yesterday.  Suspicious for malingering at this time.  Physical exam was unremarkable with patient was neurovascularly intact, no acute abnormalities noted.  Neuroexam benign.  No signs of injury.  Patient was ambulatory with no gait abnormality.   States that she does not have  a primary care as she does not want to go as [facility] is closer.  Due to patient have been seen here many times in the past 6 months for the same complaint, low suspicion for emergent pathology as patient was just recently discharged yesterday and is complaining of same symptoms today.  Patient was noted to be hypertensive.  However is not complaining of any hypertensive emergency symptoms at this time.  Recommended that she establish care with primary care and to return to ED for any new symptoms.  Disposition: After consideration of the diagnostic results and the patients response to treatment, I feel that patient met from discharge and treatment as above.   emergency department workup does not suggest an emergent condition requiring admission or immediate intervention beyond what has been performed at this time. The plan is: Continue with symptomatic treatment with Tylenol, follow-up PCP, return for any new or worsening symptoms. The patient is safe for discharge and has been instructed to return immediately for worsening symptoms, change in symptoms or any other concerns.   Final Clinical Impression(s) / ED Diagnoses Final diagnoses:  Generalized body aches    Rx / DC Orders ED Discharge Orders     None         Lavonia Drafts 03/09/23 0902    Eber Hong, MD 03/10/23 [phone]

## 2023-03-09 NOTE — ED Notes (Signed)
BP taken twice

## 2023-03-09 NOTE — Progress Notes (Signed)
CSW spoke with patient who states she still has no water and needs help. Patient currently has an open APS case with [facility] CSW contacted [facility] after hours to make them aware that patient is in the ED and still has no water. CSW is awaiting a call back.

## 2023-03-09 NOTE — ED Triage Notes (Signed)
Pt just discharged, attempted to contact daughter [name] who is emergency contact with no answer several times. Pt states Jesus takes care of her at night since her children don't want to.

## 2023-03-11 ENCOUNTER — Emergency Department (HOSPITAL_COMMUNITY)
Admission: EM | Admit: 2023-03-11 | Discharge: 2023-03-12 | Disposition: A | Discharge status: Home / Self Care | Payer: 59 | Attending: Emergency Medicine | Admitting: Emergency Medicine

## 2023-03-11 ENCOUNTER — Other Ambulatory Visit: Payer: Self-pay

## 2023-03-11 DIAGNOSIS — Z79899 Other long term (current) drug therapy: Secondary | ICD-10-CM | POA: Insufficient documentation

## 2023-03-11 DIAGNOSIS — I10 Essential (primary) hypertension: Secondary | ICD-10-CM | POA: Diagnosis not present

## 2023-03-11 DIAGNOSIS — Z7984 Long term (current) use of oral hypoglycemic drugs: Secondary | ICD-10-CM | POA: Insufficient documentation

## 2023-03-11 DIAGNOSIS — E119 Type 2 diabetes mellitus without complications: Secondary | ICD-10-CM | POA: Diagnosis not present

## 2023-03-11 DIAGNOSIS — G8929 Other chronic pain: Secondary | ICD-10-CM | POA: Insufficient documentation

## 2023-03-11 DIAGNOSIS — Z7901 Long term (current) use of anticoagulants: Secondary | ICD-10-CM | POA: Diagnosis not present

## 2023-03-11 DIAGNOSIS — Z8673 Personal history of transient ischemic attack (TIA), and cerebral infarction without residual deficits: Secondary | ICD-10-CM | POA: Diagnosis not present

## 2023-03-11 DIAGNOSIS — M79601 Pain in right arm: Secondary | ICD-10-CM | POA: Diagnosis present

## 2023-03-11 DIAGNOSIS — M79604 Pain in right leg: Secondary | ICD-10-CM | POA: Diagnosis present

## 2023-03-11 NOTE — ED Triage Notes (Signed)
Patient reports chronic bilateral leg pain for several years , denies injury / ambulatory .

## 2023-03-11 NOTE — Care Management (Signed)
ED RNCM contacted APS awaiting a return call.

## 2023-03-11 NOTE — Care Management (Signed)
Received call from on-call APS worker,  another report filed patient still states, that she does not have running water. Patient has had 59 visits to the ED in the past 6 months, and refuses assistance with arranging primary care.APS reports that she will send the information to patient's APS SW [place] [phone] to follow-up on the case in the community.

## 2023-03-12 ENCOUNTER — Emergency Department (HOSPITAL_COMMUNITY)
Admission: EM | Admit: 2023-03-12 | Discharge: 2023-03-12 | Disposition: A | Discharge status: Home / Self Care | Payer: 59 | Attending: Emergency Medicine | Admitting: Emergency Medicine

## 2023-03-12 ENCOUNTER — Encounter (HOSPITAL_COMMUNITY): Payer: Self-pay

## 2023-03-12 DIAGNOSIS — Z7984 Long term (current) use of oral hypoglycemic drugs: Secondary | ICD-10-CM | POA: Insufficient documentation

## 2023-03-12 DIAGNOSIS — I10 Essential (primary) hypertension: Secondary | ICD-10-CM | POA: Insufficient documentation

## 2023-03-12 DIAGNOSIS — E119 Type 2 diabetes mellitus without complications: Secondary | ICD-10-CM | POA: Insufficient documentation

## 2023-03-12 DIAGNOSIS — Z79899 Other long term (current) drug therapy: Secondary | ICD-10-CM | POA: Insufficient documentation

## 2023-03-12 DIAGNOSIS — M79601 Pain in right arm: Secondary | ICD-10-CM | POA: Insufficient documentation

## 2023-03-12 DIAGNOSIS — G8929 Other chronic pain: Secondary | ICD-10-CM | POA: Insufficient documentation

## 2023-03-12 DIAGNOSIS — Z8673 Personal history of transient ischemic attack (TIA), and cerebral infarction without residual deficits: Secondary | ICD-10-CM | POA: Insufficient documentation

## 2023-03-12 DIAGNOSIS — Z7901 Long term (current) use of anticoagulants: Secondary | ICD-10-CM | POA: Insufficient documentation

## 2023-03-12 MED ORDER — NAPROXEN 250 MG PO TABS
500.0000 mg | ORAL_TABLET | Freq: Once | ORAL | Status: AC
  Administered 2023-03-12: 500 mg via ORAL
  Filled 2023-03-12: qty 2

## 2023-03-12 MED ORDER — AMLODIPINE BESYLATE 5 MG PO TABS
10.0000 mg | ORAL_TABLET | Freq: Once | ORAL | Status: AC
  Administered 2023-03-12: 10 mg via ORAL
  Filled 2023-03-12: qty 2

## 2023-03-12 MED ORDER — ACETAMINOPHEN 500 MG PO TABS
1000.0000 mg | ORAL_TABLET | Freq: Once | ORAL | Status: AC
  Administered 2023-03-12: 1000 mg via ORAL
  Filled 2023-03-12: qty 2

## 2023-03-12 NOTE — ED Provider Triage Note (Signed)
Emergency Medicine Provider Triage Evaluation Note  [name] , a 78 y.o. female  was evaluated in triage.  Pt complains of generalized body pain.  Was seen yesterday for same.  Review of Systems  Positive: See above Negative: See above  Physical Exam  BP (!) 173/84   Pulse 68   Temp 97.9 F (36.6 C) (Oral)   Resp 15   Ht 5\' 4"  (1.626 m)   SpO2 99%   BMI 18.92 kg/m  Gen:   Awake, no distress   Resp:  Normal effort  MSK:   Moves extremities without difficulty  Other:    Medical Decision Making  Medically screening exam initiated at 5:35 PM.  Appropriate orders placed.  [name] was informed that the remainder of the evaluation will be completed by another provider, this initial triage assessment does not replace that evaluation, and the importance of remaining in the ED until their evaluation is complete.  Work up started   Adriana Eagle, PA-C 03/12/23 1736

## 2023-03-12 NOTE — ED Notes (Addendum)
 NA

## 2023-03-12 NOTE — Discharge Instructions (Signed)
We gave you a dose of pain medication and blood pressure medication.

## 2023-03-12 NOTE — ED Provider Notes (Signed)
[place] EMERGENCY DEPARTMENT AT [facility] Provider Note  CSN: [id number] Arrival date & time: 03/12/23 1521  Chief Complaint(s) body pain  HPI [name] is a 78 y.o. female history of diabetes, hypertension presenting to the emergency department with right arm pain.  She also reports generalized body pain.  She relates this to a fall a few months ago.  She has been seen in the emergency department numerous times for the same complaint.  She denies any change in her complaint.  She reports she does not take any pain medication at home because "it does not work"   Past Medical History Past Medical History:  Diagnosis Date   Diabetes mellitus without complication (HCC)    GERD 09/04/2006   Qualifier: Diagnosis of  By: Duke Salvia     History of echocardiogram    a. 2D ECHO: 11/06/2013 EF 65%; no WMA. Mild TR. PA pk pressure 43 mm HG   Hypertension    Stroke [facility])    Patient Active Problem List   Diagnosis Date Noted   Cognitive impairment 06/16/2021   Chronic headache 06/16/2021   Healthcare maintenance 06/16/2021   Frequent patient in emergency department 06/16/2021   Cervical stenosis of spine 11/10/2019   Type 2 diabetes mellitus with hyperlipidemia (HCC) 06/20/2012   History of stroke 06/20/2012   Essential hypertension 09/04/2006   Osteoarthritis 09/04/2006   Home Medication(s) Prior to Admission medications   Medication Sig Start Date End Date Taking? Authorizing Provider  acetaminophen (TYLENOL) 500 MG tablet Take 2 tablets (1,000 mg total) by mouth every 6 (six) hours as needed for moderate pain (pain score 4-6). 01/13/23   Fayrene Helper, PA-C  albuterol (VENTOLIN HFA) 108 (90 Base) MCG/ACT inhaler Inhale 1-2 puffs into the lungs every 6 (six) hours as needed for wheezing or shortness of breath. 04/22/20   Barbette Merino, NP  amLODipine (NORVASC) 10 MG tablet Take 1 tablet (10 mg total) by mouth daily. 01/23/23   Roemhildt, Lorin T, PA-C  chlorthalidone  (HYGROTON) 25 MG tablet TAKE 1 TABLET (25 MG TOTAL) BY MOUTH DAILY. 11/20/21 11/20/22  Donell Beers, FNP  clopidogrel (PLAVIX) 75 MG tablet Take 1 tablet (75 mg total) by mouth daily. 06/15/21   Elige Radon, MD  diclofenac Sodium (VOLTAREN) 1 % GEL Apply 2 g topically 4 (four) times daily. 03/02/23   [name], PA-C  diclofenac Sodium (VOLTAREN) 1 % GEL Apply 4 g topically 4 (four) times daily. 03/08/23   Durwin Glaze, MD  diphenhydrAMINE (BENADRYL) 25 MG tablet Take 1 tablet (25 mg total) by mouth every 6 (six) hours as needed for itching or allergies. 10/03/22   Fayrene Helper, PA-C  gabapentin (NEURONTIN) 600 MG tablet Take 0.5 tablets (300 mg total) by mouth 3 (three) times daily. 10/27/22 12/26/22  Pricilla Loveless, MD  hydrocortisone cream 1 % Apply to affected area 2 times daily 10/22/22   Garlon Hatchet, PA-C  hydrOXYzine (ATARAX) 25 MG tablet Take 1 tablet (25 mg total) by mouth every 8 (eight) hours as needed. 11/23/22   Henderly, Britni A, PA-C  lidocaine (LIDODERM) 5 % Place 1 patch onto the skin daily. Remove & Discard patch within 12 hours or as directed by MD 02/18/23   Roemhildt, Lorin T, PA-C  lidocaine (LIDODERM) 5 % Place 1 patch onto the skin daily. Remove & Discard patch within 12 hours or as directed by MD 03/08/23   Durwin Glaze, MD  loratadine (CLARITIN) 10 MG tablet Take  1 tablet (10 mg total) by mouth daily. 10/17/22   Renne Crigler, PA-C  metFORMIN (GLUCOPHAGE) 500 MG tablet Take 1 tablet (500 mg total) by mouth 2 (two) times daily with a meal. 11/03/22 11/03/23  Gareth Eagle, PA-C  naproxen (NAPROSYN) 375 MG tablet Take 1 tablet (375 mg total) by mouth 2 (two) times daily. 02/08/23   Henderly, Britni A, PA-C  pantoprazole (PROTONIX) 20 MG tablet Take 1 tablet (20 mg total) by mouth daily. 10/10/21   [name], MD  triamcinolone cream (KENALOG) 0.1 % Apply 1 Application topically 2 (two) times daily. Apply to rash 09/27/22   Rondel Baton, MD                                                                                                                                     Past Surgical History Past Surgical History:  Procedure Laterality Date   ABDOMINAL HYSTERECTOMY     ANTERIOR CERVICAL DECOMPRESSION/DISCECTOMY FUSION 4 LEVELS N/A 11/11/2019   Procedure: Cervical three-four Cervical four-five Cervical five-six Cervical six-seven  Anterior cervical decompression/discectomy/fusion;  Surgeon: Maeola Harman, MD;  [facility];  Service: Neurosurgery;  Laterality: N/A;   LEFT HEART CATHETERIZATION WITH CORONARY ANGIOGRAM N/A 11/05/2013   Procedure: LEFT HEART CATHETERIZATION WITH CORONARY ANGIOGRAM;  Surgeon: Lennette Bihari, MD;  Location: [facility] CATH LAB;  Service: Cardiovascular;  Laterality: N/A;   Family History History reviewed. No pertinent family history.  Social History Social History   Tobacco Use   Smoking status: Never   Smokeless tobacco: Never  Vaping Use   Vaping status: Never Used  Substance Use Topics   Alcohol use: No   Drug use: No   Allergies Codeine, Ibuprofen, and Imdur [isosorbide nitrate]  Review of Systems Review of Systems  All other systems reviewed and are negative.   Physical Exam Vital Signs  I have reviewed the triage vital signs BP (!) 175/64 (BP Location: Right Arm)   Pulse 71   Temp 97.9 F (36.6 C) (Oral)   Resp 16   Ht 5\' 4"  (1.626 m)   SpO2 98%   BMI 18.92 kg/m  Physical Exam Vitals and nursing note reviewed.  Constitutional:      Appearance: Normal appearance.  HENT:     Head: Normocephalic and atraumatic.     Mouth/Throat:     Mouth: Mucous membranes are moist.  Eyes:     Conjunctiva/sclera: Conjunctivae normal.  Cardiovascular:     Rate and Rhythm: Normal rate.  Pulmonary:     Effort: Pulmonary effort is normal. No respiratory distress.  Abdominal:     General: Abdomen is flat.  Musculoskeletal:        General: No deformity.     Comments: No midline C, T, L-spine tenderness.  No  chest wall tenderness or crepitus.  Full painless range of motion at the bilateral upper extremities including the shoulders, elbows, wrists, hand and  fingers, and in the bilateral lower extremities including the hips, knees, ankle, toes.  No focal bony tenderness, injury or deformity.  Skin:    General: Skin is warm and dry.     Capillary Refill: Capillary refill takes less than 2 seconds.  Neurological:     General: No focal deficit present.     Mental Status: She is alert. Mental status is at baseline.  Psychiatric:        Mood and Affect: Mood normal.        Behavior: Behavior normal.     ED Results and Treatments Labs (all labs ordered are listed, but only abnormal results are displayed) Labs Reviewed - No data to display                                                                                                                        Radiology No results found.  Pertinent labs & imaging results that were available during my care of the patient were reviewed by me and considered in my medical decision making (see MDM for details).  Medications Ordered in ED Medications  acetaminophen (TYLENOL) tablet 1,000 mg (has no administration in time range)                                                                                                                                     Procedures Procedures  (including critical care time)  Medical Decision Making / ED Course   MDM:  78 year old presenting to the emergency department with chronic pain from distant fall.  Patient has been in the emergency department almost daily for similar complaints.  Does not seem that anything is changed since previous visits.  Her examination today is without evidence of any new traumatic injuries.  I do not think the patient needs any other workup at this time. Will discharge patient to home. All questions answered. Patient comfortable with plan of discharge. Return precautions discussed  with patient and specified on the after visit summary.       Additional history obtained:  -External records from outside source obtained and reviewed including: Chart review including previous notes, labs, imaging, consultation notes including prior ER visits for same    Medicines ordered and prescription drug management: Meds ordered this encounter  Medications   acetaminophen (TYLENOL) tablet 1,000 mg    -I have reviewed the patients home medicines and have made  adjustments as needed  Co morbidities that complicate the patient evaluation  Past Medical History:  Diagnosis Date   Diabetes mellitus without complication (HCC)    GERD 09/04/2006   Qualifier: Diagnosis of  By: Duke Salvia     History of echocardiogram    a. 2D ECHO: 11/06/2013 EF 65%; no WMA. Mild TR. PA pk pressure 43 mm HG   Hypertension    Stroke [facility])       Dispostion: Disposition decision including need for hospitalization was considered, and patient discharged from emergency department.    Final Clinical Impression(s) / ED Diagnoses Final diagnoses:  Other chronic pain     This chart was dictated using voice recognition software.  Despite best efforts to proofread,  errors can occur which can change the documentation meaning.    Lonell Grandchild, MD 03/12/23 [phone]
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
## 2023-03-12 NOTE — ED Triage Notes (Signed)
Pt to ED c/o "whole body hurt since I fell" Ambulatory.

## 2023-03-17 ENCOUNTER — Emergency Department (HOSPITAL_COMMUNITY)
Admission: EM | Admit: 2023-03-17 | Discharge: 2023-03-17 | Disposition: A | Discharge status: Home / Self Care | Payer: 59 | Attending: Emergency Medicine | Admitting: Emergency Medicine

## 2023-03-17 ENCOUNTER — Encounter (HOSPITAL_COMMUNITY): Payer: Self-pay

## 2023-03-17 ENCOUNTER — Other Ambulatory Visit: Payer: Self-pay

## 2023-03-17 DIAGNOSIS — Z7984 Long term (current) use of oral hypoglycemic drugs: Secondary | ICD-10-CM | POA: Diagnosis not present

## 2023-03-17 DIAGNOSIS — G8929 Other chronic pain: Secondary | ICD-10-CM | POA: Insufficient documentation

## 2023-03-17 MED ORDER — NAPROXEN 250 MG PO TABS
500.0000 mg | ORAL_TABLET | Freq: Once | ORAL | Status: AC
  Administered 2023-03-17: 500 mg via ORAL
  Filled 2023-03-17: qty 2

## 2023-03-17 MED ORDER — NAPROXEN 375 MG PO TABS: 375.0000 mg | ORAL_TABLET | Freq: Two times a day (BID) | ORAL | 0 refills | Status: DC
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
## 2023-03-17 NOTE — ED Triage Notes (Signed)
 Pt arrived POV stating her whole body hurts after she fell last week. Pt states tylenol is not helping she needs a good pain pill.
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
## 2023-03-17 NOTE — Discharge Instructions (Signed)
 I have written for medication to help with your pain.  We have talked with the Child psychotherapist.  They will be in contact with you about your water  Return for new or worsening symptoms

## 2023-03-18 ENCOUNTER — Emergency Department (HOSPITAL_COMMUNITY)
Admission: EM | Admit: 2023-03-18 | Discharge: 2023-03-19 | Disposition: A | Discharge status: Home / Self Care | Payer: 59

## 2023-03-18 DIAGNOSIS — G8929 Other chronic pain: Secondary | ICD-10-CM | POA: Insufficient documentation

## 2023-03-18 DIAGNOSIS — R030 Elevated blood-pressure reading, without diagnosis of hypertension: Secondary | ICD-10-CM

## 2023-03-18 DIAGNOSIS — I10 Essential (primary) hypertension: Secondary | ICD-10-CM | POA: Insufficient documentation

## 2023-03-18 DIAGNOSIS — M542 Cervicalgia: Secondary | ICD-10-CM | POA: Diagnosis not present

## 2023-03-18 DIAGNOSIS — Z79899 Other long term (current) drug therapy: Secondary | ICD-10-CM | POA: Insufficient documentation

## 2023-03-18 NOTE — ED Notes (Signed)
 Pt requested to speak with Child psychotherapist. Triage RN made aware.

## 2023-03-18 NOTE — ED Triage Notes (Signed)
 Pt here for the same thing she is every day

## 2023-03-19 DIAGNOSIS — I10 Essential (primary) hypertension: Secondary | ICD-10-CM | POA: Diagnosis not present

## 2023-03-19 MED ORDER — METHOCARBAMOL 500 MG PO TABS: 500.0000 mg | ORAL_TABLET | Freq: Two times a day (BID) | ORAL | 0 refills | Status: DC

## 2023-03-19 MED ORDER — AMLODIPINE BESYLATE 5 MG PO TABS
10.0000 mg | ORAL_TABLET | Freq: Once | ORAL | Status: AC
  Administered 2023-03-19: 10 mg via ORAL
  Filled 2023-03-19: qty 2

## 2023-03-19 MED ORDER — CHLORTHALIDONE 25 MG PO TABS
25.0000 mg | ORAL_TABLET | Freq: Every day | ORAL | Status: DC
  Administered 2023-03-19: 25 mg via ORAL
  Filled 2023-03-19: qty 1

## 2023-03-19 MED ORDER — CHLORTHALIDONE 25 MG PO TABS
25.0000 mg | ORAL_TABLET | Freq: Every day | ORAL | Status: DC
  Filled 2023-03-19: qty 1

## 2023-03-19 MED ORDER — NAPROXEN 250 MG PO TABS
500.0000 mg | ORAL_TABLET | Freq: Once | ORAL | Status: AC
  Administered 2023-03-19: 500 mg via ORAL
  Filled 2023-03-19: qty 2

## 2023-03-19 NOTE — ED Notes (Signed)
 Pt verbalized understanding of discharge instructions. Opportunity for questions provided.  Pt provided a bus pass and wheeled out to lobby.    Pt informed a Tourist information centre manager (APS) has been contacted about her water issue.  Also, Pt encouraged to call her daughter.

## 2023-03-19 NOTE — Discharge Instructions (Addendum)
 Please be sure to take your blood pressure medication and follow-up with your doctor to have this rechecked as an outpatient.  Try the Robaxin in addition to the medications recently prescribed for your pain see if this helps.  Follow-up with your doctor.  Return to the ER for worsening symptoms.

## 2023-03-19 NOTE — ED Provider Notes (Signed)
 [place] EMERGENCY DEPARTMENT AT [facility] Provider Note   CSN: [id number] Arrival date & time: 03/18/23  1317     History  Chief Complaint  Patient presents with   Neck Pain    [name] is a 78 y.o. female.  78 year old female with past medical history of hypertension and chronic pain presenting to the emergency department today with myalgias.  The patient initially checked in for neck pain.  She states that this as well as has her myalgias are all chronic issues for her.  She states that she was having some symptoms last night so she decided to come in to the ER for reevaluation.  She states that she has not taken her blood pressure medications this morning because she is here.  She denies any chest pain, shortness of breath, weakness, numbness, or tingling.   Neck Pain      Home Medications Prior to Admission medications   Medication Sig Start Date End Date Taking? Authorizing Provider  methocarbamol (ROBAXIN) 500 MG tablet Take 1 tablet (500 mg total) by mouth 2 (two) times daily. 03/19/23  Yes Durwin Glaze, MD  acetaminophen (TYLENOL) 500 MG tablet Take 2 tablets (1,000 mg total) by mouth every 6 (six) hours as needed for moderate pain (pain score 4-6). 01/13/23   Fayrene Helper, PA-C  albuterol (VENTOLIN HFA) 108 (90 Base) MCG/ACT inhaler Inhale 1-2 puffs into the lungs every 6 (six) hours as needed for wheezing or shortness of breath. 04/22/20   Barbette Merino, NP  amLODipine (NORVASC) 10 MG tablet Take 1 tablet (10 mg total) by mouth daily. 01/23/23   Roemhildt, Lorin T, PA-C  chlorthalidone (HYGROTON) 25 MG tablet TAKE 1 TABLET (25 MG TOTAL) BY MOUTH DAILY. 11/20/21 11/20/22  Donell Beers, FNP  diphenhydrAMINE (BENADRYL) 25 MG tablet Take 1 tablet (25 mg total) by mouth every 6 (six) hours as needed for itching or allergies. 10/03/22   Fayrene Helper, PA-C  hydrocortisone cream 1 % Apply to affected area 2 times daily 10/22/22   Garlon Hatchet, PA-C   hydrOXYzine (ATARAX) 25 MG tablet Take 1 tablet (25 mg total) by mouth every 8 (eight) hours as needed. 11/23/22   Henderly, Britni A, PA-C  lidocaine (LIDODERM) 5 % Place 1 patch onto the skin daily. Remove & Discard patch within 12 hours or as directed by MD 02/18/23   Roemhildt, Lorin T, PA-C  lidocaine (LIDODERM) 5 % Place 1 patch onto the skin daily. Remove & Discard patch within 12 hours or as directed by MD 03/08/23   Durwin Glaze, MD  loratadine (CLARITIN) 10 MG tablet Take 1 tablet (10 mg total) by mouth daily. 10/17/22   Renne Crigler, PA-C  metFORMIN (GLUCOPHAGE) 500 MG tablet Take 1 tablet (500 mg total) by mouth 2 (two) times daily with a meal. 11/03/22 11/03/23  Gareth Eagle, PA-C  naproxen (NAPROSYN) 375 MG tablet Take 1 tablet (375 mg total) by mouth 2 (two) times daily. 03/17/23   Henderly, Britni A, PA-C  pantoprazole (PROTONIX) 20 MG tablet Take 1 tablet (20 mg total) by mouth daily. 10/10/21   [name], MD  triamcinolone cream (KENALOG) 0.1 % Apply 1 Application topically 2 (two) times daily. Apply to rash 09/27/22   Rondel Baton, MD      Allergies    Codeine, Ibuprofen, and Imdur [isosorbide nitrate]    Review of Systems   Review of Systems  Musculoskeletal:  Positive for arthralgias and neck pain.  All other systems reviewed and are negative.   Physical Exam Updated Vital Signs BP (!) 200/76   Pulse 69   Temp 98.3 F (36.8 C)   Resp 18   SpO2 97%  Physical Exam Vitals and nursing note reviewed.   Gen: NAD Eyes: PERRL, EOMI HEENT: no oropharyngeal swelling Neck: trachea midline Resp: clear to auscultation bilaterally Card: RRR, no murmurs, rubs, or gallops Abd: nontender, nondistended Extremities: no calf tenderness, no edema Vascular: 2+ radial pulses bilaterally, 2+ DP pulses bilaterally Neuro: No focal deficits Skin: no rashes Psyc: acting appropriately   ED Results / Procedures / Treatments   Labs (all labs ordered are listed, but  only abnormal results are displayed) Labs Reviewed - No data to display  EKG None  Radiology No results found.  Procedures Procedures    Medications Ordered in ED Medications  amLODipine (NORVASC) tablet 10 mg (has no administration in time range)  chlorthalidone (HYGROTON) tablet 25 mg (has no administration in time range)  naproxen (NAPROSYN) tablet 500 mg (has no administration in time range)    ED Course/ Medical Decision Making/ A&P                                 Medical Decision Making 78 year old female with past medical history of hypertension and chronic pain presenting to the emergency department today with her chronic pain.  The patient blood pressure is also elevated here on arrival.  The patient is not taking her blood pressure medications this morning.  She has a reassuring neurovascular exam and denies any symptoms of endorgan dysfunction.  I will give the patient her blood pressure medications this morning.  Will give her a dose of naproxen here.  Given her multiple ER visits with similar symptoms I think that she is stable for discharge.  Suspicion for acute emergent pathology is low as she presents very often with similar symptoms and this has been going on for years.  Risk Prescription drug management.           Final Clinical Impression(s) / ED Diagnoses Final diagnoses:  Elevated blood pressure reading    Rx / DC Orders ED Discharge Orders          Ordered    methocarbamol (ROBAXIN) 500 MG tablet  2 times daily        03/19/23 0759              Durwin Glaze, MD 03/19/23 [phone]

## 2023-03-22 ENCOUNTER — Emergency Department (HOSPITAL_COMMUNITY)
Admission: EM | Admit: 2023-03-22 | Discharge: 2023-03-22 | Disposition: A | Discharge status: Home / Self Care | Payer: 59 | Attending: Emergency Medicine | Admitting: Emergency Medicine

## 2023-03-22 ENCOUNTER — Emergency Department (HOSPITAL_COMMUNITY)
Admission: EM | Admit: 2023-03-22 | Discharge: 2023-03-23 | Disposition: A | Discharge status: Home / Self Care | Payer: 59 | Attending: Emergency Medicine | Admitting: Emergency Medicine

## 2023-03-22 ENCOUNTER — Other Ambulatory Visit: Payer: Self-pay

## 2023-03-22 ENCOUNTER — Encounter (HOSPITAL_COMMUNITY): Payer: Self-pay

## 2023-03-22 DIAGNOSIS — M791 Myalgia, unspecified site: Secondary | ICD-10-CM | POA: Diagnosis present

## 2023-03-22 DIAGNOSIS — G8929 Other chronic pain: Secondary | ICD-10-CM | POA: Insufficient documentation

## 2023-03-22 DIAGNOSIS — E119 Type 2 diabetes mellitus without complications: Secondary | ICD-10-CM | POA: Insufficient documentation

## 2023-03-22 DIAGNOSIS — Z7984 Long term (current) use of oral hypoglycemic drugs: Secondary | ICD-10-CM | POA: Insufficient documentation

## 2023-03-22 DIAGNOSIS — I10 Essential (primary) hypertension: Secondary | ICD-10-CM | POA: Insufficient documentation

## 2023-03-22 DIAGNOSIS — R52 Pain, unspecified: Secondary | ICD-10-CM | POA: Diagnosis present

## 2023-03-22 NOTE — ED Triage Notes (Signed)
 Pt here for the same thing she is every day
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
## 2023-03-23 ENCOUNTER — Other Ambulatory Visit: Payer: Self-pay

## 2023-03-23 ENCOUNTER — Encounter (HOSPITAL_COMMUNITY): Payer: Self-pay | Admitting: *Deleted

## 2023-03-23 DIAGNOSIS — M791 Myalgia, unspecified site: Secondary | ICD-10-CM | POA: Diagnosis not present

## 2023-03-23 MED ORDER — NAPROXEN 250 MG PO TABS
500.0000 mg | ORAL_TABLET | Freq: Once | ORAL | Status: AC
  Administered 2023-03-23: 500 mg via ORAL
  Filled 2023-03-23: qty 2
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
## 2023-03-23 NOTE — ED Triage Notes (Signed)
 The pt was just seen and discharged and she came out and checked in again

## 2023-03-23 NOTE — ED Notes (Signed)
 Pt ambulatory from lobby to bed assignment.

## 2023-03-23 NOTE — Discharge Instructions (Signed)
 Follow up with a primary care doctor regarding your ongoing pain complaints.

## 2023-03-23 NOTE — ED Notes (Signed)
 Pt informed staff "This is going to be my primary care office I am not changing places. I will be coming back here again when I need to. I do not want anything else done right now show me the way out." Staff educated patient on the discharge instructions for using the [place] primary care office attached to the hospital and further instructions for discharge.

## 2023-03-24 ENCOUNTER — Emergency Department (HOSPITAL_COMMUNITY)
Admission: EM | Admit: 2023-03-24 | Discharge: 2023-03-24 | Disposition: A | Discharge status: Home / Self Care | Attending: Emergency Medicine | Admitting: Emergency Medicine

## 2023-03-24 ENCOUNTER — Other Ambulatory Visit: Payer: Self-pay

## 2023-03-24 ENCOUNTER — Encounter (HOSPITAL_COMMUNITY): Payer: Self-pay

## 2023-03-24 DIAGNOSIS — Z79899 Other long term (current) drug therapy: Secondary | ICD-10-CM | POA: Insufficient documentation

## 2023-03-24 DIAGNOSIS — M79631 Pain in right forearm: Secondary | ICD-10-CM | POA: Diagnosis present

## 2023-03-24 DIAGNOSIS — G8929 Other chronic pain: Secondary | ICD-10-CM | POA: Diagnosis not present

## 2023-03-24 DIAGNOSIS — Z7984 Long term (current) use of oral hypoglycemic drugs: Secondary | ICD-10-CM | POA: Insufficient documentation

## 2023-03-24 MED ORDER — ACETAMINOPHEN 325 MG PO TABS
650.0000 mg | ORAL_TABLET | Freq: Once | ORAL | Status: AC
  Administered 2023-03-24: 650 mg via ORAL
  Filled 2023-03-24: qty 2

## 2023-03-24 MED ORDER — LIDOCAINE 5 % EX PTCH
1.0000 | MEDICATED_PATCH | CUTANEOUS | Status: DC
  Administered 2023-03-24: 1 via TRANSDERMAL
  Filled 2023-03-24: qty 1
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
## 2023-03-24 NOTE — ED Triage Notes (Signed)
 Pt states "I am having real bad body pain"

## 2023-03-25 ENCOUNTER — Encounter (HOSPITAL_COMMUNITY): Payer: Self-pay

## 2023-03-25 ENCOUNTER — Emergency Department (HOSPITAL_COMMUNITY)
Admission: EM | Admit: 2023-03-25 | Discharge: 2023-03-26 | Disposition: A | Discharge status: Home / Self Care | Attending: Emergency Medicine | Admitting: Emergency Medicine

## 2023-03-25 ENCOUNTER — Other Ambulatory Visit: Payer: Self-pay

## 2023-03-25 DIAGNOSIS — Z79899 Other long term (current) drug therapy: Secondary | ICD-10-CM | POA: Diagnosis not present

## 2023-03-25 DIAGNOSIS — Z7984 Long term (current) use of oral hypoglycemic drugs: Secondary | ICD-10-CM | POA: Diagnosis not present

## 2023-03-25 DIAGNOSIS — M79605 Pain in left leg: Secondary | ICD-10-CM | POA: Diagnosis not present

## 2023-03-25 DIAGNOSIS — G8929 Other chronic pain: Secondary | ICD-10-CM | POA: Diagnosis present

## 2023-03-25 DIAGNOSIS — M79604 Pain in right leg: Secondary | ICD-10-CM | POA: Diagnosis present

## 2023-03-25 DIAGNOSIS — M791 Myalgia, unspecified site: Secondary | ICD-10-CM | POA: Diagnosis not present
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
## 2023-03-26 ENCOUNTER — Emergency Department (HOSPITAL_COMMUNITY)
Admission: EM | Admit: 2023-03-26 | Discharge: 2023-03-26 | Disposition: A | Discharge status: Home / Self Care | Source: Home / Self Care | Attending: Emergency Medicine | Admitting: Emergency Medicine

## 2023-03-26 ENCOUNTER — Emergency Department (HOSPITAL_COMMUNITY)
Admission: EM | Admit: 2023-03-26 | Discharge: 2023-03-26 | Disposition: A | Discharge status: Home / Self Care | Source: Home / Self Care

## 2023-03-26 ENCOUNTER — Other Ambulatory Visit: Payer: Self-pay

## 2023-03-26 ENCOUNTER — Encounter (HOSPITAL_COMMUNITY): Payer: Self-pay

## 2023-03-26 DIAGNOSIS — Z79899 Other long term (current) drug therapy: Secondary | ICD-10-CM | POA: Insufficient documentation

## 2023-03-26 DIAGNOSIS — Y92811 Bus as the place of occurrence of the external cause: Secondary | ICD-10-CM | POA: Insufficient documentation

## 2023-03-26 DIAGNOSIS — M79604 Pain in right leg: Secondary | ICD-10-CM | POA: Diagnosis not present

## 2023-03-26 DIAGNOSIS — G8929 Other chronic pain: Secondary | ICD-10-CM

## 2023-03-26 DIAGNOSIS — Z7984 Long term (current) use of oral hypoglycemic drugs: Secondary | ICD-10-CM | POA: Insufficient documentation

## 2023-03-26 DIAGNOSIS — M791 Myalgia, unspecified site: Secondary | ICD-10-CM | POA: Insufficient documentation

## 2023-03-26 DIAGNOSIS — W19XXXA Unspecified fall, initial encounter: Secondary | ICD-10-CM | POA: Insufficient documentation

## 2023-03-26 DIAGNOSIS — M79605 Pain in left leg: Secondary | ICD-10-CM | POA: Insufficient documentation

## 2023-03-26 MED ORDER — AMLODIPINE BESYLATE 5 MG PO TABS
10.0000 mg | ORAL_TABLET | Freq: Once | ORAL | Status: AC
  Administered 2023-03-26: 10 mg via ORAL
  Filled 2023-03-26: qty 2

## 2023-03-26 MED ORDER — ACETAMINOPHEN 500 MG PO TABS
1000.0000 mg | ORAL_TABLET | Freq: Once | ORAL | Status: AC
  Administered 2023-03-26: 1000 mg via ORAL
  Filled 2023-03-26: qty 2

## 2023-03-26 MED ORDER — ACETAMINOPHEN 500 MG PO TABS
1000.0000 mg | ORAL_TABLET | Freq: Once | ORAL | Status: AC
Start: 2023-03-26 — End: 2023-03-26
  Administered 2023-03-26: 1000 mg via ORAL
  Filled 2023-03-26: qty 2
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
## 2023-03-26 NOTE — Discharge Instructions (Signed)
 These follow up with a primary care physician the emergency department is for true medical emergencies only not for chronic conditions.  Your blood pressure was normal. These come for emergencies
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
## 2023-03-26 NOTE — ED Notes (Signed)
 Pt.s BP 206/77 , given Norvasc waiting to recheck BP before DIC

## 2023-03-26 NOTE — Discharge Instructions (Signed)
 You were seen today for concerns of pain. You were given acetaminophen for pain. Please take this at home for your pain. Return to the ER for any new or worsening symptoms.

## 2023-03-26 NOTE — ED Notes (Signed)
 Per MD Countryman, OK to d/c with BP 211/84.

## 2023-03-26 NOTE — ED Notes (Signed)
 Message sent to [facility], MD for bp med orders.
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
## 2023-03-27 ENCOUNTER — Encounter (HOSPITAL_COMMUNITY): Payer: Self-pay

## 2023-03-27 ENCOUNTER — Emergency Department (HOSPITAL_COMMUNITY)
Admission: EM | Admit: 2023-03-27 | Discharge: 2023-03-28 | Disposition: A | Discharge status: Home / Self Care | Attending: Emergency Medicine | Admitting: Emergency Medicine

## 2023-03-27 DIAGNOSIS — Z7984 Long term (current) use of oral hypoglycemic drugs: Secondary | ICD-10-CM | POA: Diagnosis not present

## 2023-03-27 DIAGNOSIS — Z79899 Other long term (current) drug therapy: Secondary | ICD-10-CM | POA: Diagnosis not present

## 2023-03-27 DIAGNOSIS — I10 Essential (primary) hypertension: Secondary | ICD-10-CM | POA: Insufficient documentation

## 2023-03-27 DIAGNOSIS — G8929 Other chronic pain: Secondary | ICD-10-CM | POA: Insufficient documentation
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
## 2023-03-27 NOTE — ED Provider Triage Note (Signed)
 Emergency Medicine Provider Triage Evaluation Note  Jaedyn Marrufo , a 78 y.o. female  was evaluated in triage.  Pt complains of pain everywhere.  No recent trauma.  Says Tylenol does not work.  Review of Systems  Positive: As above Negative: As above  Physical Exam  BP (!) 163/53 (BP Location: Right Arm)   Pulse 86   Temp 98.2 F (36.8 C) (Oral)   Resp 16   SpO2 98%  Gen:   Awake, no distress   Resp:  Normal effort  MSK:   Moves extremities without difficulty  Other:  Soft cervical collar in place  Medical Decision Making  Medically screening exam initiated at 4:03 PM.  Appropriate orders placed.  Lessa Huge was informed that the remainder of the evaluation will be completed by another provider, this initial triage assessment does not replace that evaluation, and the importance of remaining in the ED until their evaluation is complete.     Michelle Piper, [place] 03/27/23 1603

## 2023-03-28 MED ORDER — ACETAMINOPHEN 325 MG PO TABS
650.0000 mg | ORAL_TABLET | Freq: Once | ORAL | Status: AC
  Administered 2023-03-28: 650 mg via ORAL
  Filled 2023-03-28: qty 2

## 2023-03-28 NOTE — Discharge Instructions (Signed)
 Please follow-up with a primary care provider to recent symptoms and ER visit.  You may take Tylenol every 6 as needed for pain.  If symptoms change or worsen please return to the ER.

## 2023-03-28 NOTE — ED Provider Notes (Signed)
 [place] EMERGENCY DEPARTMENT AT [facility] Provider Note   CSN: [id number] Arrival date & time: 03/27/23  1547     History  Chief Complaint  Patient presents with   Generalized Body Aches    [name] is a 78 y.o. female history of hypertension, chronic neck pain presented for her chronic neck pain.  Patient been seen 6 6 times in the past 6 months and has been seen 5 times in the past 6 days for this.  Patient denies any recent trauma or changes to her symptoms.  Patient presents in a c-collar that she wears chronically.  Patient does not see a primary care provider.     Home Medications Prior to Admission medications   Medication Sig Start Date End Date Taking? Authorizing Provider  acetaminophen (TYLENOL) 500 MG tablet Take 2 tablets (1,000 mg total) by mouth every 6 (six) hours as needed for moderate pain (pain score 4-6). 01/13/23   Fayrene Helper, PA-C  albuterol (VENTOLIN HFA) 108 (90 Base) MCG/ACT inhaler Inhale 1-2 puffs into the lungs every 6 (six) hours as needed for wheezing or shortness of breath. 04/22/20   Barbette Merino, NP  amLODipine (NORVASC) 10 MG tablet Take 1 tablet (10 mg total) by mouth daily. 01/23/23   Roemhildt, Lorin T, PA-C  chlorthalidone (HYGROTON) 25 MG tablet TAKE 1 TABLET (25 MG TOTAL) BY MOUTH DAILY. 11/20/21 11/20/22  Donell Beers, FNP  diphenhydrAMINE (BENADRYL) 25 MG tablet Take 1 tablet (25 mg total) by mouth every 6 (six) hours as needed for itching or allergies. 10/03/22   Fayrene Helper, PA-C  hydrocortisone cream 1 % Apply to affected area 2 times daily 10/22/22   Garlon Hatchet, PA-C  hydrOXYzine (ATARAX) 25 MG tablet Take 1 tablet (25 mg total) by mouth every 8 (eight) hours as needed. 11/23/22   Henderly, Britni A, PA-C  lidocaine (LIDODERM) 5 % Place 1 patch onto the skin daily. Remove & Discard patch within 12 hours or as directed by MD 02/18/23   Roemhildt, Lorin T, PA-C  lidocaine (LIDODERM) 5 % Place 1 patch onto the skin  daily. Remove & Discard patch within 12 hours or as directed by MD 03/08/23   Durwin Glaze, MD  loratadine (CLARITIN) 10 MG tablet Take 1 tablet (10 mg total) by mouth daily. 10/17/22   Renne Crigler, PA-C  metFORMIN (GLUCOPHAGE) 500 MG tablet Take 1 tablet (500 mg total) by mouth 2 (two) times daily with a meal. 11/03/22 11/03/23  Gareth Eagle, PA-C  methocarbamol (ROBAXIN) 500 MG tablet Take 1 tablet (500 mg total) by mouth 2 (two) times daily. 03/19/23   Durwin Glaze, MD  naproxen (NAPROSYN) 375 MG tablet Take 1 tablet (375 mg total) by mouth 2 (two) times daily. 03/17/23   Henderly, Britni A, PA-C  pantoprazole (PROTONIX) 20 MG tablet Take 1 tablet (20 mg total) by mouth daily. 10/10/21   [name], MD  triamcinolone cream (KENALOG) 0.1 % Apply 1 Application topically 2 (two) times daily. Apply to rash 09/27/22   Rondel Baton, MD      Allergies    Codeine, Ibuprofen, and Imdur [isosorbide nitrate]    Review of Systems   Review of Systems  Physical Exam Updated Vital Signs BP (!) 158/81 (BP Location: Left Arm)   Pulse 84   Temp 98.2 F (36.8 C) (Oral)   Resp 18   Ht 5\' 4"  (1.626 m)   Wt 49.9 kg   SpO2 98%  BMI 18.88 kg/m  Physical Exam Vitals reviewed.  Constitutional:      General: She is not in acute distress. HENT:     Head: Normocephalic and atraumatic.  Eyes:     Extraocular Movements: Extraocular movements intact.     Conjunctiva/sclera: Conjunctivae normal.     Pupils: Pupils are equal, round, and reactive to light.  Neck:     Comments: In c-collar Cardiovascular:     Rate and Rhythm: Normal rate and regular rhythm.     Pulses: Normal pulses.     Heart sounds: Normal heart sounds.     Comments: 2+ bilateral radial/dorsalis pedis pulses with regular rate Pulmonary:     Effort: Pulmonary effort is normal. No respiratory distress.     Breath sounds: Normal breath sounds.  Abdominal:     Palpations: Abdomen is soft.     Tenderness: There is no  abdominal tenderness. There is no guarding or rebound.  Musculoskeletal:        General: Normal range of motion.     Comments: 5 out of 5 bilateral grip/leg extension strength  Skin:    General: Skin is warm and dry.     Capillary Refill: Capillary refill takes less than 2 seconds.  Neurological:     General: No focal deficit present.     Mental Status: She is alert and oriented to person, place, and time.     Comments: Sensation intact in all 4 limbs  Psychiatric:        Mood and Affect: Mood normal.     ED Results / Procedures / Treatments   Labs (all labs ordered are listed, but only abnormal results are displayed) Labs Reviewed - No data to display  EKG None  Radiology No results found.  Procedures Procedures    Medications Ordered in ED Medications  acetaminophen (TYLENOL) tablet 650 mg (has no administration in time range)    ED Course/ Medical Decision Making/ A&P                                 Medical Decision Making Risk OTC drugs.   [name] 78 y.o. presented today for chronic pain. Working DDx that I considered at this time includes, but not limited to, chronic pain, acute fracture.  R/o DDx: Acute fracture: These are considered less likely due to history of present illness, physical exam, labs/imaging findings  Review of prior external notes: 03/25/2023 ED  Unique Tests and My Independent Interpretation: None  Social Determinants of Health: none  Discussion with Independent Historian: None  Discussion of Management of Tests: None  Risk: Medium: prescription drug management  Risk Stratification Score: None  Plan: On exam patient was no acute distress with stable vitals.  Patient's exam is unremarkable.  Patient is been seen 66 times in the past 6 months and seen 5 times in the past 6 days for the same chief concern.  Patient is well-known to the emergency department and myself and at this time do not feel patient needs imaging as there  have been no new symptoms or trauma.  I strongly encouraged patient to follow-up with a primary care provider for long-term management.  Patient states that Tylenol does not work but then says that she wants a 650 mg Tylenol which we will give before discharge.  Patient was given return precautions. Patient stable for discharge at this time.  Patient verbalized understanding of plan.  This chart was dictated using voice recognition software.  Despite best efforts to proofread,  errors can occur which can change the documentation meaning.         Final Clinical Impression(s) / ED Diagnoses Final diagnoses:  Other chronic pain    Rx / DC Orders ED Discharge Orders     None         Remi Deter 03/28/23 0409    Tilden Fossa, MD 03/28/23 [phone]

## 2023-03-29 ENCOUNTER — Emergency Department (HOSPITAL_COMMUNITY)
Admission: EM | Admit: 2023-03-29 | Discharge: 2023-03-29 | Disposition: A | Discharge status: Home / Self Care | Attending: Emergency Medicine | Admitting: Emergency Medicine

## 2023-03-29 ENCOUNTER — Emergency Department (HOSPITAL_COMMUNITY)
Admission: EM | Admit: 2023-03-29 | Discharge: 2023-03-29 | Disposition: A | Discharge status: Home / Self Care | Source: Home / Self Care

## 2023-03-29 ENCOUNTER — Encounter (HOSPITAL_COMMUNITY): Payer: Self-pay

## 2023-03-29 ENCOUNTER — Other Ambulatory Visit: Payer: Self-pay

## 2023-03-29 DIAGNOSIS — R52 Pain, unspecified: Secondary | ICD-10-CM | POA: Diagnosis present

## 2023-03-29 DIAGNOSIS — G8929 Other chronic pain: Secondary | ICD-10-CM | POA: Insufficient documentation

## 2023-03-29 DIAGNOSIS — Z609 Problem related to social environment, unspecified: Secondary | ICD-10-CM | POA: Insufficient documentation

## 2023-03-29 DIAGNOSIS — Z79899 Other long term (current) drug therapy: Secondary | ICD-10-CM | POA: Diagnosis not present

## 2023-03-29 DIAGNOSIS — Z7984 Long term (current) use of oral hypoglycemic drugs: Secondary | ICD-10-CM | POA: Insufficient documentation

## 2023-03-29 NOTE — Progress Notes (Signed)
 CSW attempted to reach Maple Hudson [phone]) at [facility] without success - a voicemail was left requesting a return call.  Edwin Dada, MSW, LCSW Transitions of Care  Clinical Social Worker II [phone]

## 2023-03-29 NOTE — Care Management (Signed)
 RNCM contacted [facility]PS regarding patient has had 20 ED visits in the past 6 months. Report was was filed , this patient already has an open APS case DSS worker Maple Hudson  [phone],  ED Daytime CSW also reached out to DSS worker today 3/7.

## 2023-03-29 NOTE — ED Provider Notes (Signed)
 [place] EMERGENCY DEPARTMENT AT [facility] Provider Note   CSN: [id number] Arrival date & time: 03/29/23  1416     History  Chief Complaint  Patient presents with   Generalized Body Aches    [name] is a 78 y.o. female.  Patient here for asking for help to get her water turned back on at her house.  She states that she had to pay rent this month and can appear the water bill.  She says she is not having any issues with food water otherwise.  She denies any current pain.  She has chronic pain.  She has no chest pain shortness of breath weakness numbness tingling.  She denies any new fall.  She has no physical complaints.    The history is provided by the patient.       Home Medications Prior to Admission medications   Medication Sig Start Date End Date Taking? Authorizing Provider  acetaminophen (TYLENOL) 500 MG tablet Take 2 tablets (1,000 mg total) by mouth every 6 (six) hours as needed for moderate pain (pain score 4-6). 01/13/23   Fayrene Helper, PA-C  albuterol (VENTOLIN HFA) 108 (90 Base) MCG/ACT inhaler Inhale 1-2 puffs into the lungs every 6 (six) hours as needed for wheezing or shortness of breath. 04/22/20   Barbette Merino, NP  amLODipine (NORVASC) 10 MG tablet Take 1 tablet (10 mg total) by mouth daily. 01/23/23   Roemhildt, Lorin T, PA-C  chlorthalidone (HYGROTON) 25 MG tablet TAKE 1 TABLET (25 MG TOTAL) BY MOUTH DAILY. 11/20/21 11/20/22  Donell Beers, FNP  diphenhydrAMINE (BENADRYL) 25 MG tablet Take 1 tablet (25 mg total) by mouth every 6 (six) hours as needed for itching or allergies. 10/03/22   Fayrene Helper, PA-C  hydrocortisone cream 1 % Apply to affected area 2 times daily 10/22/22   Garlon Hatchet, PA-C  hydrOXYzine (ATARAX) 25 MG tablet Take 1 tablet (25 mg total) by mouth every 8 (eight) hours as needed. 11/23/22   Henderly, Britni A, PA-C  lidocaine (LIDODERM) 5 % Place 1 patch onto the skin daily. Remove & Discard patch within 12 hours or as  directed by MD 02/18/23   Roemhildt, Lorin T, PA-C  lidocaine (LIDODERM) 5 % Place 1 patch onto the skin daily. Remove & Discard patch within 12 hours or as directed by MD 03/08/23   Durwin Glaze, MD  loratadine (CLARITIN) 10 MG tablet Take 1 tablet (10 mg total) by mouth daily. 10/17/22   Renne Crigler, PA-C  metFORMIN (GLUCOPHAGE) 500 MG tablet Take 1 tablet (500 mg total) by mouth 2 (two) times daily with a meal. 11/03/22 11/03/23  Gareth Eagle, PA-C  methocarbamol (ROBAXIN) 500 MG tablet Take 1 tablet (500 mg total) by mouth 2 (two) times daily. 03/19/23   Durwin Glaze, MD  naproxen (NAPROSYN) 375 MG tablet Take 1 tablet (375 mg total) by mouth 2 (two) times daily. 03/17/23   Henderly, Britni A, PA-C  pantoprazole (PROTONIX) 20 MG tablet Take 1 tablet (20 mg total) by mouth daily. 10/10/21   [name], MD  triamcinolone cream (KENALOG) 0.1 % Apply 1 Application topically 2 (two) times daily. Apply to rash 09/27/22   Rondel Baton, MD      Allergies    Codeine, Ibuprofen, and Imdur [isosorbide nitrate]    Review of Systems   Review of Systems  Physical Exam Updated Vital Signs BP (!) 147/67 (BP Location: Right Arm)   Pulse 79  Temp 98 F (36.7 C) (Oral)   Resp 16   Ht 5\' 4"  (1.626 m)   SpO2 99%   BMI 18.88 kg/m  Physical Exam Vitals and nursing note reviewed.  Constitutional:      General: She is not in acute distress.    Appearance: She is well-developed.  HENT:     Head: Normocephalic and atraumatic.  Eyes:     Conjunctiva/sclera: Conjunctivae normal.  Neck:     Comments: Cervical collar in place Cardiovascular:     Rate and Rhythm: Normal rate and regular rhythm.     Heart sounds: No murmur heard. Pulmonary:     Effort: Pulmonary effort is normal. No respiratory distress.     Breath sounds: Normal breath sounds.  Abdominal:     Palpations: Abdomen is soft.     Tenderness: There is no abdominal tenderness.  Musculoskeletal:        General: No swelling.      Cervical back: Neck supple.  Skin:    General: Skin is warm and dry.     Capillary Refill: Capillary refill takes less than 2 seconds.  Neurological:     Mental Status: She is alert.  Psychiatric:        Mood and Affect: Mood normal.     ED Results / Procedures / Treatments   Labs (all labs ordered are listed, but only abnormal results are displayed) Labs Reviewed - No data to display  EKG None  Radiology No results found.  Procedures Procedures    Medications Ordered in ED Medications - No data to display  ED Course/ Medical Decision Making/ A&P                                 Medical Decision Making  ;[name] is here for social issue.  Normal vitals.  No fever.  Well-appearing.  She has no complaints physically on my evaluation of her.  When I asked her what brought her in today she states that she wanted help to get the water back turned onto her house she says that she could not pay the water bill because she had to pay her rent instead.  She has an ED visit every day or every other day for the last several months and is pretty well-known to the ED.  Her exam is normal.  She denies any new fall she denies any new pain.  Social work's contacted [employer] and this is somewhat of an active case.  But at this time I unfortunately cannot help her with her request but I think from medical standpoint she is cleared to go back home.  Social work is involved.  This chart was dictated using voice recognition software.  Despite best efforts to proofread,  errors can occur which can change the documentation meaning.         Final Clinical Impression(s) / ED Diagnoses Final diagnoses:  Social problem    Rx / DC Orders ED Discharge Orders     None         Virgina Norfolk, DO 03/29/23 1726
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
## 2023-03-30 ENCOUNTER — Encounter (HOSPITAL_COMMUNITY): Payer: Self-pay | Admitting: Emergency Medicine

## 2023-03-30 ENCOUNTER — Emergency Department (HOSPITAL_COMMUNITY)
Admission: EM | Admit: 2023-03-30 | Discharge: 2023-03-31 | Discharge status: Against Medical Advice | Source: Home / Self Care

## 2023-03-30 ENCOUNTER — Encounter (HOSPITAL_COMMUNITY): Payer: Self-pay

## 2023-03-30 ENCOUNTER — Other Ambulatory Visit: Payer: Self-pay

## 2023-03-30 ENCOUNTER — Emergency Department (HOSPITAL_COMMUNITY)
Admission: EM | Admit: 2023-03-30 | Discharge: 2023-03-30 | Disposition: A | Discharge status: Home / Self Care | Attending: Emergency Medicine | Admitting: Emergency Medicine

## 2023-03-30 DIAGNOSIS — R519 Headache, unspecified: Secondary | ICD-10-CM | POA: Diagnosis present

## 2023-03-30 DIAGNOSIS — Z79899 Other long term (current) drug therapy: Secondary | ICD-10-CM | POA: Insufficient documentation

## 2023-03-30 DIAGNOSIS — Z5321 Procedure and treatment not carried out due to patient leaving prior to being seen by health care provider: Secondary | ICD-10-CM | POA: Insufficient documentation

## 2023-03-30 DIAGNOSIS — G8929 Other chronic pain: Secondary | ICD-10-CM | POA: Diagnosis not present

## 2023-03-30 DIAGNOSIS — Z7689 Persons encountering health services in other specified circumstances: Secondary | ICD-10-CM | POA: Insufficient documentation

## 2023-03-30 DIAGNOSIS — Z7984 Long term (current) use of oral hypoglycemic drugs: Secondary | ICD-10-CM | POA: Insufficient documentation

## 2023-03-30 MED ORDER — NAPROXEN 250 MG PO TABS
500.0000 mg | ORAL_TABLET | Freq: Once | ORAL | Status: AC
  Administered 2023-03-30: 500 mg via ORAL
  Filled 2023-03-30: qty 2

## 2023-03-30 NOTE — ED Triage Notes (Signed)
 Patient requesting to talk to social worker for help with water bills. Denies medical compliant.
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
## 2023-03-30 NOTE — ED Notes (Signed)
Patient verbalizes understanding of discharge instructions. Opportunity for questioning and answers were provided. Armband removed by staff, pt discharged from ED. Ambulated out to lobby  

## 2023-03-30 NOTE — Discharge Instructions (Signed)
Follow-up outpatient, return for new or worsening symptoms

## 2023-03-30 NOTE — ED Triage Notes (Signed)
 Pt here for the whole body pain like every day

## 2023-03-31 ENCOUNTER — Other Ambulatory Visit: Payer: Self-pay

## 2023-03-31 ENCOUNTER — Emergency Department (HOSPITAL_COMMUNITY)
Admission: EM | Admit: 2023-03-31 | Discharge: 2023-04-01 | Disposition: A | Discharge status: Home / Self Care | Attending: Emergency Medicine | Admitting: Emergency Medicine

## 2023-03-31 ENCOUNTER — Encounter (HOSPITAL_COMMUNITY): Payer: Self-pay | Admitting: Emergency Medicine

## 2023-03-31 DIAGNOSIS — Z79899 Other long term (current) drug therapy: Secondary | ICD-10-CM | POA: Diagnosis not present

## 2023-03-31 DIAGNOSIS — I10 Essential (primary) hypertension: Secondary | ICD-10-CM | POA: Diagnosis not present

## 2023-03-31 DIAGNOSIS — E119 Type 2 diabetes mellitus without complications: Secondary | ICD-10-CM | POA: Diagnosis not present

## 2023-03-31 DIAGNOSIS — M791 Myalgia, unspecified site: Secondary | ICD-10-CM | POA: Diagnosis not present

## 2023-03-31 DIAGNOSIS — Z644 Discord with counselors: Secondary | ICD-10-CM | POA: Diagnosis present

## 2023-03-31 DIAGNOSIS — G8929 Other chronic pain: Secondary | ICD-10-CM | POA: Insufficient documentation

## 2023-03-31 DIAGNOSIS — Z7984 Long term (current) use of oral hypoglycemic drugs: Secondary | ICD-10-CM | POA: Diagnosis not present

## 2023-03-31 DIAGNOSIS — Z760 Encounter for issue of repeat prescription: Secondary | ICD-10-CM | POA: Insufficient documentation

## 2023-03-31 NOTE — ED Triage Notes (Signed)
 Pt in ambulatory with chronic neck pain, states she needs a rx refill of her Lidocaine patches

## 2023-03-31 NOTE — ED Notes (Signed)
 Patient left stating she was waiting for it to be sun light outside.

## 2023-04-01 ENCOUNTER — Emergency Department (HOSPITAL_COMMUNITY)
Admission: EM | Admit: 2023-04-01 | Discharge: 2023-04-01 | Disposition: A | Discharge status: Home / Self Care | Source: Home / Self Care | Attending: Emergency Medicine | Admitting: Emergency Medicine

## 2023-04-01 DIAGNOSIS — G8929 Other chronic pain: Secondary | ICD-10-CM | POA: Insufficient documentation

## 2023-04-01 DIAGNOSIS — E119 Type 2 diabetes mellitus without complications: Secondary | ICD-10-CM | POA: Insufficient documentation

## 2023-04-01 DIAGNOSIS — M791 Myalgia, unspecified site: Secondary | ICD-10-CM | POA: Insufficient documentation

## 2023-04-01 DIAGNOSIS — I10 Essential (primary) hypertension: Secondary | ICD-10-CM | POA: Insufficient documentation

## 2023-04-01 DIAGNOSIS — Z7984 Long term (current) use of oral hypoglycemic drugs: Secondary | ICD-10-CM | POA: Insufficient documentation

## 2023-04-01 DIAGNOSIS — Z644 Discord with counselors: Secondary | ICD-10-CM | POA: Insufficient documentation

## 2023-04-01 DIAGNOSIS — Z79899 Other long term (current) drug therapy: Secondary | ICD-10-CM | POA: Insufficient documentation

## 2023-04-01 DIAGNOSIS — Z609 Problem related to social environment, unspecified: Secondary | ICD-10-CM

## 2023-04-01 MED ORDER — LIDOCAINE 5 % EX PTCH: 1.0000 | MEDICATED_PATCH | CUTANEOUS | 0 refills | Status: DC

## 2023-04-01 MED ORDER — LIDOCAINE 5 % EX PTCH
1.0000 | MEDICATED_PATCH | CUTANEOUS | Status: DC
  Administered 2023-04-01: 1 via TRANSDERMAL
  Filled 2023-04-01: qty 1

## 2023-04-01 NOTE — ED Provider Notes (Signed)
 [place] EMERGENCY DEPARTMENT AT [facility] Provider Note   CSN: [id number] Arrival date & time: 04/01/23  1524     History  Chief Complaint  Patient presents with   Generalized Body Aches    [name] is a 78 y.o. female with PMHx of HTN, OA, T2DM presents to emergency department for social problem.  She reports that she needs to speak with a Child psychotherapist regarding her water bill.  She had to pay her rent and therefore was unable to pick her water pill.  She has no medical complaints at this time.  Multiple ED visits over past several months. Complaints of water bill most recently on 03/29/23  HPI     Home Medications Prior to Admission medications   Medication Sig Start Date End Date Taking? Authorizing Provider  acetaminophen (TYLENOL) 500 MG tablet Take 2 tablets (1,000 mg total) by mouth every 6 (six) hours as needed for moderate pain (pain score 4-6). 01/13/23   Fayrene Helper, PA-C  albuterol (VENTOLIN HFA) 108 (90 Base) MCG/ACT inhaler Inhale 1-2 puffs into the lungs every 6 (six) hours as needed for wheezing or shortness of breath. 04/22/20   Barbette Merino, NP  amLODipine (NORVASC) 10 MG tablet Take 1 tablet (10 mg total) by mouth daily. 01/23/23   Roemhildt, Lorin T, PA-C  chlorthalidone (HYGROTON) 25 MG tablet TAKE 1 TABLET (25 MG TOTAL) BY MOUTH DAILY. 11/20/21 11/20/22  Donell Beers, FNP  diphenhydrAMINE (BENADRYL) 25 MG tablet Take 1 tablet (25 mg total) by mouth every 6 (six) hours as needed for itching or allergies. 10/03/22   Fayrene Helper, PA-C  hydrocortisone cream 1 % Apply to affected area 2 times daily 10/22/22   Garlon Hatchet, PA-C  hydrOXYzine (ATARAX) 25 MG tablet Take 1 tablet (25 mg total) by mouth every 8 (eight) hours as needed. 11/23/22   Henderly, Britni A, PA-C  lidocaine (LIDODERM) 5 % Place 1 patch onto the skin daily. Remove & Discard patch within 12 hours or as directed by MD 04/01/23   Roxy Horseman, PA-C  loratadine (CLARITIN)  10 MG tablet Take 1 tablet (10 mg total) by mouth daily. 10/17/22   Renne Crigler, PA-C  metFORMIN (GLUCOPHAGE) 500 MG tablet Take 1 tablet (500 mg total) by mouth 2 (two) times daily with a meal. 11/03/22 11/03/23  Gareth Eagle, PA-C  methocarbamol (ROBAXIN) 500 MG tablet Take 1 tablet (500 mg total) by mouth 2 (two) times daily. 03/19/23   Durwin Glaze, MD  naproxen (NAPROSYN) 375 MG tablet Take 1 tablet (375 mg total) by mouth 2 (two) times daily. 03/17/23   Henderly, Britni A, PA-C  pantoprazole (PROTONIX) 20 MG tablet Take 1 tablet (20 mg total) by mouth daily. 10/10/21   [name], MD  triamcinolone cream (KENALOG) 0.1 % Apply 1 Application topically 2 (two) times daily. Apply to rash 09/27/22   Rondel Baton, MD      Allergies    Codeine, Ibuprofen, and Imdur [isosorbide nitrate]    Review of Systems   Review of Systems  Constitutional:  Negative for chills, fatigue and fever.  Respiratory:  Negative for cough, chest tightness, shortness of breath and wheezing.   Cardiovascular:  Negative for chest pain and palpitations.  Gastrointestinal:  Negative for abdominal pain, constipation, diarrhea, nausea and vomiting.  Neurological:  Negative for dizziness, seizures, weakness, light-headedness, numbness and headaches.    Physical Exam Updated Vital Signs BP (!) 177/75 (BP Location: Right Arm)  Pulse 79   Temp 97.8 F (36.6 C) (Oral)   Resp 17   SpO2 99%  Physical Exam Vitals and nursing note reviewed.  Constitutional:      General: She is not in acute distress.    Appearance: Normal appearance. She is not ill-appearing.  HENT:     Head: Normocephalic and atraumatic.  Eyes:     Conjunctiva/sclera: Conjunctivae normal.  Neck:     Comments: C-collar in place PTA Cardiovascular:     Rate and Rhythm: Normal rate.  Pulmonary:     Effort: Pulmonary effort is normal. No respiratory distress.     Breath sounds: Normal breath sounds.  Musculoskeletal:     Cervical  back: Normal range of motion and neck supple. No rigidity or tenderness.  Skin:    Coloration: Skin is not jaundiced or pale.  Neurological:     Mental Status: She is alert and oriented to person, place, and time. Mental status is at baseline.     ED Results / Procedures / Treatments   Labs (all labs ordered are listed, but only abnormal results are displayed) Labs Reviewed - No data to display  EKG None  Radiology No results found.  Procedures Procedures    Medications Ordered in ED Medications - No data to display  ED Course/ Medical Decision Making/ A&P                                 Medical Decision Making   Patient presents to the ED for concern of social problem.   Co morbidities that complicate the patient evaluation  See HPI   Additional history obtained:  Additional history obtained from Nursing   External records from outside source obtained and reviewed including triage RN note    Problem List / ED Course:  Social problem Has chronic neck pain from several months ago. Was evaluated in ED at that time with no fx on ct cervical spine. No recent falls. No TTP of midline processes. Do not see need to image. States "I need to see social work for a Copywriter, advertising to get water at home" PE unremarkable. VS notable for HTN but is on amlodipine and BP is similar to prior. Discussed patient to f/u with [facility]    Reevaluation:  After the interventions noted above, I reevaluated the patient and found that they have :stayed the same   Social Determinants of Health:  Multiple ED visits    Dispostion:  After consideration of the diagnostic results and the patients response to treatment, I feel that the patent would benefit from outpatient management.   Discussed patient with Dr. Freida Busman who agrees Final Clinical Impression(s) / ED Diagnoses Final diagnoses:  Social problem    Rx / DC Orders ED Discharge Orders      None         Judithann Sheen, PA 04/01/23 1914    Terrilee Files, MD 04/02/23 1113
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
## 2023-04-02 ENCOUNTER — Encounter (HOSPITAL_COMMUNITY): Payer: Self-pay | Admitting: Emergency Medicine

## 2023-04-02 ENCOUNTER — Emergency Department (HOSPITAL_COMMUNITY)
Admission: EM | Admit: 2023-04-02 | Discharge: 2023-04-03 | Disposition: A | Discharge status: Home / Self Care | Attending: Emergency Medicine | Admitting: Emergency Medicine

## 2023-04-02 ENCOUNTER — Other Ambulatory Visit: Payer: Self-pay

## 2023-04-02 DIAGNOSIS — Z79899 Other long term (current) drug therapy: Secondary | ICD-10-CM | POA: Diagnosis not present

## 2023-04-02 DIAGNOSIS — Z7984 Long term (current) use of oral hypoglycemic drugs: Secondary | ICD-10-CM | POA: Diagnosis not present

## 2023-04-02 DIAGNOSIS — I10 Essential (primary) hypertension: Secondary | ICD-10-CM | POA: Insufficient documentation

## 2023-04-02 DIAGNOSIS — E119 Type 2 diabetes mellitus without complications: Secondary | ICD-10-CM | POA: Insufficient documentation

## 2023-04-02 DIAGNOSIS — M25511 Pain in right shoulder: Secondary | ICD-10-CM | POA: Insufficient documentation

## 2023-04-02 DIAGNOSIS — G8929 Other chronic pain: Secondary | ICD-10-CM | POA: Insufficient documentation
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
## 2023-04-03 DIAGNOSIS — G8929 Other chronic pain: Secondary | ICD-10-CM | POA: Diagnosis not present

## 2023-04-03 MED ORDER — LIDOCAINE 5 % EX PTCH
1.0000 | MEDICATED_PATCH | CUTANEOUS | Status: DC
  Administered 2023-04-03: 1 via TRANSDERMAL
  Filled 2023-04-03: qty 1
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
## 2023-04-05 ENCOUNTER — Emergency Department (HOSPITAL_COMMUNITY)
Admission: EM | Admit: 2023-04-05 | Discharge: 2023-04-06 | Disposition: A | Discharge status: Home / Self Care | Attending: Emergency Medicine | Admitting: Emergency Medicine

## 2023-04-05 ENCOUNTER — Encounter (HOSPITAL_COMMUNITY): Payer: Self-pay | Admitting: *Deleted

## 2023-04-05 ENCOUNTER — Other Ambulatory Visit: Payer: Self-pay

## 2023-04-05 DIAGNOSIS — Z7984 Long term (current) use of oral hypoglycemic drugs: Secondary | ICD-10-CM | POA: Diagnosis not present

## 2023-04-05 DIAGNOSIS — M791 Myalgia, unspecified site: Secondary | ICD-10-CM | POA: Diagnosis present

## 2023-04-05 DIAGNOSIS — G8929 Other chronic pain: Secondary | ICD-10-CM | POA: Diagnosis not present

## 2023-04-05 DIAGNOSIS — M549 Dorsalgia, unspecified: Secondary | ICD-10-CM | POA: Diagnosis not present

## 2023-04-05 DIAGNOSIS — M79604 Pain in right leg: Secondary | ICD-10-CM | POA: Diagnosis not present

## 2023-04-05 DIAGNOSIS — I10 Essential (primary) hypertension: Secondary | ICD-10-CM | POA: Diagnosis not present

## 2023-04-05 DIAGNOSIS — M79642 Pain in left hand: Secondary | ICD-10-CM | POA: Diagnosis not present

## 2023-04-05 DIAGNOSIS — H538 Other visual disturbances: Secondary | ICD-10-CM | POA: Diagnosis not present

## 2023-04-05 DIAGNOSIS — R519 Headache, unspecified: Secondary | ICD-10-CM | POA: Diagnosis not present

## 2023-04-05 DIAGNOSIS — G8921 Chronic pain due to trauma: Secondary | ICD-10-CM | POA: Diagnosis not present

## 2023-04-05 DIAGNOSIS — E119 Type 2 diabetes mellitus without complications: Secondary | ICD-10-CM | POA: Diagnosis not present

## 2023-04-05 DIAGNOSIS — M79641 Pain in right hand: Secondary | ICD-10-CM | POA: Diagnosis present

## 2023-04-05 DIAGNOSIS — Z139 Encounter for screening, unspecified: Secondary | ICD-10-CM | POA: Diagnosis present

## 2023-04-05 DIAGNOSIS — Z79899 Other long term (current) drug therapy: Secondary | ICD-10-CM | POA: Diagnosis not present

## 2023-04-05 DIAGNOSIS — M79605 Pain in left leg: Secondary | ICD-10-CM | POA: Diagnosis not present
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
## 2023-04-06 ENCOUNTER — Encounter (HOSPITAL_COMMUNITY): Payer: Self-pay | Admitting: Emergency Medicine

## 2023-04-06 ENCOUNTER — Other Ambulatory Visit: Payer: Self-pay

## 2023-04-06 ENCOUNTER — Emergency Department (HOSPITAL_COMMUNITY)
Admission: EM | Admit: 2023-04-06 | Discharge: 2023-04-06 | Disposition: A | Discharge status: Home / Self Care | Source: Home / Self Care | Attending: Emergency Medicine | Admitting: Emergency Medicine

## 2023-04-06 ENCOUNTER — Emergency Department (HOSPITAL_COMMUNITY)
Admission: EM | Admit: 2023-04-06 | Discharge: 2023-04-06 | Disposition: A | Discharge status: Home / Self Care | Attending: Student | Admitting: Student

## 2023-04-06 ENCOUNTER — Emergency Department (HOSPITAL_COMMUNITY)
Admission: EM | Admit: 2023-04-06 | Discharge: 2023-04-07 | Disposition: A | Discharge status: Home / Self Care | Attending: Emergency Medicine | Admitting: Emergency Medicine

## 2023-04-06 ENCOUNTER — Encounter (HOSPITAL_COMMUNITY): Payer: Self-pay | Admitting: *Deleted

## 2023-04-06 DIAGNOSIS — E119 Type 2 diabetes mellitus without complications: Secondary | ICD-10-CM | POA: Insufficient documentation

## 2023-04-06 DIAGNOSIS — M79642 Pain in left hand: Secondary | ICD-10-CM | POA: Insufficient documentation

## 2023-04-06 DIAGNOSIS — M79605 Pain in left leg: Secondary | ICD-10-CM | POA: Insufficient documentation

## 2023-04-06 DIAGNOSIS — Z139 Encounter for screening, unspecified: Secondary | ICD-10-CM | POA: Insufficient documentation

## 2023-04-06 DIAGNOSIS — I10 Essential (primary) hypertension: Secondary | ICD-10-CM | POA: Insufficient documentation

## 2023-04-06 DIAGNOSIS — G8929 Other chronic pain: Secondary | ICD-10-CM | POA: Insufficient documentation

## 2023-04-06 DIAGNOSIS — M549 Dorsalgia, unspecified: Secondary | ICD-10-CM | POA: Insufficient documentation

## 2023-04-06 DIAGNOSIS — Z79899 Other long term (current) drug therapy: Secondary | ICD-10-CM | POA: Insufficient documentation

## 2023-04-06 DIAGNOSIS — R519 Headache, unspecified: Secondary | ICD-10-CM | POA: Insufficient documentation

## 2023-04-06 DIAGNOSIS — M79604 Pain in right leg: Secondary | ICD-10-CM | POA: Insufficient documentation

## 2023-04-06 DIAGNOSIS — Z7984 Long term (current) use of oral hypoglycemic drugs: Secondary | ICD-10-CM | POA: Insufficient documentation

## 2023-04-06 DIAGNOSIS — M79641 Pain in right hand: Secondary | ICD-10-CM | POA: Insufficient documentation

## 2023-04-06 DIAGNOSIS — H538 Other visual disturbances: Secondary | ICD-10-CM | POA: Insufficient documentation

## 2023-04-06 DIAGNOSIS — G8921 Chronic pain due to trauma: Secondary | ICD-10-CM | POA: Diagnosis not present

## 2023-04-06 DIAGNOSIS — R030 Elevated blood-pressure reading, without diagnosis of hypertension: Secondary | ICD-10-CM

## 2023-04-06 DIAGNOSIS — W19XXXS Unspecified fall, sequela: Secondary | ICD-10-CM

## 2023-04-06 MED ORDER — AMLODIPINE BESYLATE 5 MG PO TABS
10.0000 mg | ORAL_TABLET | Freq: Once | ORAL | Status: AC
  Administered 2023-04-06: 10 mg via ORAL
  Filled 2023-04-06: qty 2

## 2023-04-06 MED ORDER — ACETAMINOPHEN 500 MG PO TABS
1000.0000 mg | ORAL_TABLET | Freq: Once | ORAL | Status: AC
  Administered 2023-04-06: 1000 mg via ORAL
  Filled 2023-04-06: qty 2

## 2023-04-06 MED ORDER — LORATADINE 10 MG PO TABS: 10.0000 mg | ORAL_TABLET | Freq: Every day | ORAL | 0 refills | Status: DC

## 2023-04-06 MED ORDER — LIDOCAINE 5 % EX PTCH: 1.0000 | MEDICATED_PATCH | CUTANEOUS | 0 refills | Status: DC

## 2023-04-06 MED ORDER — GABAPENTIN 300 MG PO CAPS
300.0000 mg | ORAL_CAPSULE | Freq: Once | ORAL | Status: AC
  Administered 2023-04-06: 300 mg via ORAL
  Filled 2023-04-06: qty 1

## 2023-04-06 MED ORDER — LIDOCAINE 5 % EX PTCH
1.0000 | MEDICATED_PATCH | CUTANEOUS | Status: DC
  Administered 2023-04-06: 1 via TRANSDERMAL
  Filled 2023-04-06: qty 1

## 2023-04-06 MED ORDER — NAPROXEN 500 MG PO TABS
250.0000 mg | ORAL_TABLET | Freq: Once | ORAL | Status: AC
  Administered 2023-04-06: 250 mg via ORAL
  Filled 2023-04-06: qty 1

## 2023-04-06 MED ORDER — ACETAMINOPHEN 325 MG PO TABS
650.0000 mg | ORAL_TABLET | Freq: Once | ORAL | Status: AC
  Administered 2023-04-06: 650 mg via ORAL
  Filled 2023-04-06: qty 2

## 2023-04-06 NOTE — ED Triage Notes (Signed)
 Patient was just discharged from the ED , has multiple visits , states she is here now because she forgot to tell the doctor earlier that her eyes get blurry.
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
## 2023-04-06 NOTE — ED Provider Notes (Signed)
 [place] EMERGENCY DEPARTMENT AT [facility] Provider Note   CSN: [id number] Arrival date & time: 04/06/23  0257     History  Chief Complaint  Patient presents with   Eye Problem    [name] is a 78 y.o. female patient with past medical history of hypertension, diabetes, chronic headaches resenting to emergency room with complaint of vision change.  Patient reports that she has noticed her vision has gotten blurry in the dark.  She also reports her left eye occasionally gets dry and itchy.  She is unable to tell me when the onset was however she reports that it was present earlier today during her ER visit and she has been dealing with this as long as she can remember.  She reports she had some kind of eyedrop that has worked for her in the past.  She is unsure what eyedrop it was. Denies injury, trauma, fall.  Denies any eye pain.  Denies any abnormal eye discharge.  Denies any fever or chills.    Eye Problem      Home Medications Prior to Admission medications   Medication Sig Start Date End Date Taking? Authorizing Provider  acetaminophen (TYLENOL) 500 MG tablet Take 2 tablets (1,000 mg total) by mouth every 6 (six) hours as needed for moderate pain (pain score 4-6). 01/13/23   Fayrene Helper, PA-C  albuterol (VENTOLIN HFA) 108 (90 Base) MCG/ACT inhaler Inhale 1-2 puffs into the lungs every 6 (six) hours as needed for wheezing or shortness of breath. 04/22/20   Barbette Merino, NP  amLODipine (NORVASC) 10 MG tablet Take 1 tablet (10 mg total) by mouth daily. 01/23/23   Roemhildt, Lorin T, PA-C  chlorthalidone (HYGROTON) 25 MG tablet TAKE 1 TABLET (25 MG TOTAL) BY MOUTH DAILY. 11/20/21 11/20/22  Donell Beers, FNP  diphenhydrAMINE (BENADRYL) 25 MG tablet Take 1 tablet (25 mg total) by mouth every 6 (six) hours as needed for itching or allergies. 10/03/22   Fayrene Helper, PA-C  hydrocortisone cream 1 % Apply to affected area 2 times daily 10/22/22   Garlon Hatchet, PA-C   hydrOXYzine (ATARAX) 25 MG tablet Take 1 tablet (25 mg total) by mouth every 8 (eight) hours as needed. 11/23/22   Henderly, Britni A, PA-C  lidocaine (LIDODERM) 5 % Place 1 patch onto the skin daily. Remove & Discard patch within 12 hours or as directed by MD 04/06/23   Netta Corrigan, PA-C  loratadine (CLARITIN) 10 MG tablet Take 1 tablet (10 mg total) by mouth daily. 10/17/22   Renne Crigler, PA-C  metFORMIN (GLUCOPHAGE) 500 MG tablet Take 1 tablet (500 mg total) by mouth 2 (two) times daily with a meal. 11/03/22 11/03/23  Gareth Eagle, PA-C  methocarbamol (ROBAXIN) 500 MG tablet Take 1 tablet (500 mg total) by mouth 2 (two) times daily. 03/19/23   Durwin Glaze, MD  naproxen (NAPROSYN) 375 MG tablet Take 1 tablet (375 mg total) by mouth 2 (two) times daily. 03/17/23   Henderly, Britni A, PA-C  pantoprazole (PROTONIX) 20 MG tablet Take 1 tablet (20 mg total) by mouth daily. 10/10/21   [name], MD  triamcinolone cream (KENALOG) 0.1 % Apply 1 Application topically 2 (two) times daily. Apply to rash 09/27/22   Rondel Baton, MD      Allergies    Codeine, Ibuprofen, and Imdur [isosorbide nitrate]    Review of Systems   Review of Systems  Eyes:  Positive for visual disturbance.  Physical Exam Updated Vital Signs BP (!) 232/90 (BP Location: Right Arm)   Pulse 73   Temp 98.1 F (36.7 C) (Oral)   Resp 15   Ht 5\' 4"  (1.626 m)   Wt 49.9 kg   SpO2 98%   BMI 18.88 kg/m  Physical Exam Vitals and nursing note reviewed.  Constitutional:      General: She is not in acute distress.    Appearance: She is not toxic-appearing.  HENT:     Head: Normocephalic and atraumatic.     Nose: No congestion or rhinorrhea.     Mouth/Throat:     Pharynx: No oropharyngeal exudate or posterior oropharyngeal erythema.  Eyes:     General: No scleral icterus.       Right eye: No discharge.        Left eye: No discharge.     Extraocular Movements: Extraocular movements intact.      Conjunctiva/sclera: Conjunctivae normal.     Pupils: Pupils are equal, round, and reactive to light.  Cardiovascular:     Rate and Rhythm: Normal rate and regular rhythm.     Pulses: Normal pulses.     Heart sounds: Normal heart sounds.  Pulmonary:     Effort: Pulmonary effort is normal. No respiratory distress.     Breath sounds: Normal breath sounds.  Abdominal:     General: Abdomen is flat. Bowel sounds are normal.     Palpations: Abdomen is soft.     Tenderness: There is no abdominal tenderness.  Skin:    General: Skin is warm and dry.     Findings: No lesion.  Neurological:     General: No focal deficit present.     Mental Status: She is alert and oriented to person, place, and time. Mental status is at baseline.     ED Results / Procedures / Treatments   Labs (all labs ordered are listed, but only abnormal results are displayed) Labs Reviewed - No data to display  EKG None  Radiology No results found.  Procedures Procedures    Medications Ordered in ED Medications - No data to display  ED Course/ Medical Decision Making/ A&P Clinical Course as of 04/06/23 0839  Sat Apr 06, 2023  0832 Refusing eye exam. Offered home BP medication before she left due to elevated BP reading, patient declined. Patient reports she has chronic pain that is elevating her BP, declined Tylenol, as well. Since she is well appearing and refusing workup feel she is appropriate for discharge and outpatient follow up.  [JB]    Clinical Course User Index [JB] Kaziyah Parkison, Horald Chestnut, PA-C                                 Medical Decision Making Risk OTC drugs.   This patient presents to the ED for concern of blurry vision, this involves an extensive number of treatment options, and is a complaint that carries with it a high risk of complications and morbidity.  The differential diagnosis includes acute glaucoma, conjunctivitis, corneal abrasion, chronic visual changes changes, CVA, hordeolum,  eye irritation, hemorrhage    Problem List / ED Course / Critical interventions / Medication management  Patient presenting to emergency room with complaint of eye problem.  Patient reports she has had blurry vision and she cannot see well at night.  Reports this has been going on for a long time.  Reports this problem was  present when she was seen earlier today in emergency room.  She denies any pain however she has noted some occasional dry itching of her left eye.  She has no foreign body sensation.  She has no nystagmus.  She does not wear contacts.  She has not tried anything.  She reports she used eyedrops in the past which used to help but she ran out of eyedrops.  She has no other associated symptoms.  Given the chronic nature of this we will check visual acuity and have her follow with eye doctor, do not feel further workup is needed at this time.  Will encourage patient to try over-the-counter eyedrops as well as Claritin for dry itchy skin. Encouraged patient to take blood pressure medication. Checked visual acuity.   I have reviewed the patients home medicines and have made adjustments as needed   Plan  F/u w/ PCP in 2-3d to ensure resolution of sx.  Patient was given return precautions. Patient stable for discharge at this time.  Patient educated on sx/dx and verbalized understanding of plan. Return to ER w/ new or worsening sx.          Final Clinical Impression(s) / ED Diagnoses Final diagnoses:  Other chronic pain  Blurry vision  Elevated blood pressure reading    Rx / DC Orders ED Discharge Orders     None         Smitty Knudsen, PA-C 04/06/23 0840    Glendora Score, MD 04/07/23 2114

## 2023-04-06 NOTE — ED Notes (Signed)
 Patient unable to complete visual acuity. She states that she is unable to see the first line on the panel. "Even you are blurry, I can't see" RN and PA are aware.

## 2023-04-06 NOTE — Discharge Instructions (Addendum)
 Please follow-up with your primary care provider regards resumes and ER visit.  I have sent for your lidocaine patches to be sent to your pharmacy.  At your request you are being discharged without any blood tests or medicine to bring down your blood pressure.  If you begin having chest pain, dizziness, vision changes, headache or any other changes or worsening symptoms please return to the ER.

## 2023-04-06 NOTE — ED Notes (Signed)
 Pt intolerant of/ uncooperative with/ poor effort with visual acuity. EDPA at [facility].

## 2023-04-06 NOTE — ED Provider Notes (Signed)
 [place] EMERGENCY DEPARTMENT AT [facility] Provider Note  CSN: [id number] Arrival date & time: 04/06/23 4401  Chief Complaint(s) Headache  HPI [name] is a 78 y.o. female who represents to the emergency department from the lobby due to persistent headache.  She has a extensive history of frequent ER visits for chronic pain.  Reportedly was upset about the weather and did not feel safe leaving the hospital.  Arrives with complaints of a headache.  Has been seen twice prior to my evaluation today.  Patient is agitated and anxious.   Past Medical History Past Medical History:  Diagnosis Date   Diabetes mellitus without complication (HCC)    GERD 09/04/2006   Qualifier: Diagnosis of  By: Duke Salvia     History of echocardiogram    a. 2D ECHO: 11/06/2013 EF 65%; no WMA. Mild TR. PA pk pressure 43 mm HG   Hypertension    Stroke [facility])    Patient Active Problem List   Diagnosis Date Noted   Cognitive impairment 06/16/2021   Chronic headache 06/16/2021   Healthcare maintenance 06/16/2021   Frequent patient in emergency department 06/16/2021   Cervical stenosis of spine 11/10/2019   Type 2 diabetes mellitus with hyperlipidemia (HCC) 06/20/2012   History of stroke 06/20/2012   Essential hypertension 09/04/2006   Osteoarthritis 09/04/2006   Home Medication(s) Prior to Admission medications   Medication Sig Start Date End Date Taking? Authorizing Provider  acetaminophen (TYLENOL) 500 MG tablet Take 2 tablets (1,000 mg total) by mouth every 6 (six) hours as needed for moderate pain (pain score 4-6). 01/13/23   Fayrene Helper, PA-C  albuterol (VENTOLIN HFA) 108 (90 Base) MCG/ACT inhaler Inhale 1-2 puffs into the lungs every 6 (six) hours as needed for wheezing or shortness of breath. 04/22/20   Barbette Merino, NP  amLODipine (NORVASC) 10 MG tablet Take 1 tablet (10 mg total) by mouth daily. 01/23/23   Roemhildt, Lorin T, PA-C  chlorthalidone (HYGROTON) 25 MG tablet TAKE 1  TABLET (25 MG TOTAL) BY MOUTH DAILY. 11/20/21 11/20/22  Donell Beers, FNP  diphenhydrAMINE (BENADRYL) 25 MG tablet Take 1 tablet (25 mg total) by mouth every 6 (six) hours as needed for itching or allergies. 10/03/22   Fayrene Helper, PA-C  hydrocortisone cream 1 % Apply to affected area 2 times daily 10/22/22   Garlon Hatchet, PA-C  hydrOXYzine (ATARAX) 25 MG tablet Take 1 tablet (25 mg total) by mouth every 8 (eight) hours as needed. 11/23/22   Henderly, Britni A, PA-C  lidocaine (LIDODERM) 5 % Place 1 patch onto the skin daily. Remove & Discard patch within 12 hours or as directed by MD 04/06/23   Netta Corrigan, PA-C  loratadine (CLARITIN) 10 MG tablet Take 1 tablet (10 mg total) by mouth daily. 04/06/23   Barrett, Horald Chestnut, PA-C  metFORMIN (GLUCOPHAGE) 500 MG tablet Take 1 tablet (500 mg total) by mouth 2 (two) times daily with a meal. 11/03/22 11/03/23  Gareth Eagle, PA-C  methocarbamol (ROBAXIN) 500 MG tablet Take 1 tablet (500 mg total) by mouth 2 (two) times daily. 03/19/23   Durwin Glaze, MD  naproxen (NAPROSYN) 375 MG tablet Take 1 tablet (375 mg total) by mouth 2 (two) times daily. 03/17/23   Henderly, Britni A, PA-C  pantoprazole (PROTONIX) 20 MG tablet Take 1 tablet (20 mg total) by mouth daily. 10/10/21   [name], MD  triamcinolone cream (KENALOG) 0.1 % Apply 1 Application topically 2 (two) times  daily. Apply to rash 09/27/22   Rondel Baton, MD                                                                                                                                    Past Surgical History Past Surgical History:  Procedure Laterality Date   ABDOMINAL HYSTERECTOMY     ANTERIOR CERVICAL DECOMPRESSION/DISCECTOMY FUSION 4 LEVELS N/A 11/11/2019   Procedure: Cervical three-four Cervical four-five Cervical five-six Cervical six-seven  Anterior cervical decompression/discectomy/fusion;  Surgeon: Maeola Harman, MD;  Location: [facility] OR;  Service: Neurosurgery;  Laterality: N/A;    LEFT HEART CATHETERIZATION WITH CORONARY ANGIOGRAM N/A 11/05/2013   Procedure: LEFT HEART CATHETERIZATION WITH CORONARY ANGIOGRAM;  Surgeon: Lennette Bihari, MD;  Location: [facility] CATH LAB;  Service: Cardiovascular;  Laterality: N/A;   Family History History reviewed. No pertinent family history.  Social History Social History   Tobacco Use   Smoking status: Never   Smokeless tobacco: Never  Vaping Use   Vaping status: Never Used  Substance Use Topics   Alcohol use: No   Drug use: No   Allergies Codeine, Ibuprofen, and Imdur [isosorbide nitrate]  Review of Systems Review of Systems  Neurological:  Positive for headaches.  Psychiatric/Behavioral:  The patient is nervous/anxious.     Physical Exam Vital Signs  I have reviewed the triage vital signs BP (!) 193/74 (BP Location: Right Arm)   Pulse 66   Temp 98.1 F (36.7 C) (Oral)   Resp 16   Ht 5\' 4"  (1.626 m)   Wt 49 kg   SpO2 100%   BMI 18.54 kg/m   Physical Exam Vitals and nursing note reviewed.  Constitutional:      General: She is not in acute distress.    Appearance: She is well-developed.  HENT:     Head: Normocephalic and atraumatic.  Eyes:     Conjunctiva/sclera: Conjunctivae normal.  Cardiovascular:     Rate and Rhythm: Normal rate and regular rhythm.     Heart sounds: No murmur heard. Pulmonary:     Effort: Pulmonary effort is normal. No respiratory distress.     Breath sounds: Normal breath sounds.  Abdominal:     Palpations: Abdomen is soft.     Tenderness: There is no abdominal tenderness.  Musculoskeletal:        General: No swelling.     Cervical back: Neck supple.  Skin:    General: Skin is warm and dry.     Capillary Refill: Capillary refill takes less than 2 seconds.  Neurological:     Mental Status: She is alert.     Cranial Nerves: No cranial nerve deficit.     Sensory: No sensory deficit.     Motor: No weakness.  Psychiatric:        Mood and Affect: Mood normal.     ED Results  and Treatments Labs (all labs ordered are listed, but  only abnormal results are displayed) Labs Reviewed - No data to display                                                                                                                        Radiology No results found.  Pertinent labs & imaging results that were available during my care of the patient were reviewed by me and considered in my medical decision making (see MDM for details).  Medications Ordered in ED Medications  gabapentin (NEURONTIN) capsule 300 mg (300 mg Oral Given 04/06/23 1001)  acetaminophen (TYLENOL) tablet 1,000 mg (1,000 mg Oral Given 04/06/23 1001)  amLODipine (NORVASC) tablet 10 mg (10 mg Oral Given 04/06/23 1001)                                                                                                                                     Procedures Procedures  (including critical care time)  Medical Decision Making / ED Course   This patient presents to the ED for concern of headache, this involves an extensive number of treatment options, and is a complaint that carries with it a high risk of complications and morbidity.  The differential diagnosis includes migraine, Cluster, Tension Ha, Dural venous thrombosis, Sinusitis, CO poisoning, HTN, Malignancy  MDM: Patient seen emergency room for evaluation of headache.  Physical exam is unremarkable with a normal neurologic exam.  No focal motor or sensory deficits.  No cranial nerve deficits.  Patient given her home blood pressure medication Tylenol and gabapentin leading to significant improvement of her symptoms.  She was able to rest here in the emergency department and was observed for 2 hours.  Blood pressure downtrending appropriately.  Very low suspicion for press or intracranial bleed in the setting of a normal neurologic exam, and thus head CT deferred.  At this time she does not meet inpatient criteria for admission and will be discharged with  outpatient follow-up.   Additional history obtained:  -External records from outside source obtained and reviewed including: Chart review including previous notes, labs, imaging, consultation notes  Medicines ordered and prescription drug management: Meds ordered this encounter  Medications   gabapentin (NEURONTIN) capsule 300 mg   acetaminophen (TYLENOL) tablet 1,000 mg   amLODipine (NORVASC) tablet 10 mg    -I have reviewed the patients home medicines and have made adjustments as needed  Critical interventions none   Social  Determinants of Health:  Factors impacting patients care include: Frequent ER visits for chronic pain   Reevaluation: After the interventions noted above, I reevaluated the patient and found that they have :improved  Co morbidities that complicate the patient evaluation  Past Medical History:  Diagnosis Date   Diabetes mellitus without complication (HCC)    GERD 09/04/2006   Qualifier: Diagnosis of  By: Duke Salvia     History of echocardiogram    a. 2D ECHO: 11/06/2013 EF 65%; no WMA. Mild TR. PA pk pressure 43 mm HG   Hypertension    Stroke [facility])       Dispostion: I considered admission for this patient, but at this time she does not meet inpatient criteria for admission and will be discharged with outpatient follow-up     Final Clinical Impression(s) / ED Diagnoses Final diagnoses:  Acute nonintractable headache, unspecified headache type     @PCDICTATION @    Glendora Score, MD 04/06/23 1114

## 2023-04-06 NOTE — Discharge Instructions (Addendum)
 Please follow up with Eye Doctor to have your vision checked.  I would also recommend artifical tears which can be purchased over the counter. Please pick up Clarifin or Zyrtec, which can help with dry itchy eyes, as well.   Please follow up with PCP for elevated BP and muscle aches.
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
## 2023-04-06 NOTE — ED Triage Notes (Signed)
 This is the 3rd  time patient has checked into ED was just discharged , c/o headache , states she lives alone and doesn't feel like being by herself
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
## 2023-04-06 NOTE — ED Triage Notes (Signed)
 [place], "everything hurts."    208/112 78pulse [id number]%RA

## 2023-04-06 NOTE — Progress Notes (Signed)
 CSW spoke with Geraldine Solar with [facility]. CSW was told that the patient's case is still open with [facility] [employer]. CSW was told that they would make Dorene Sorrow aware of patient's multiple visits today. CSW was told that the case is still pending at this time and no disposition has been made.

## 2023-04-07 ENCOUNTER — Emergency Department (HOSPITAL_COMMUNITY)
Admission: EM | Admit: 2023-04-07 | Discharge: 2023-04-08 | Disposition: A | Discharge status: Home / Self Care | Source: Home / Self Care | Attending: Emergency Medicine | Admitting: Emergency Medicine

## 2023-04-07 ENCOUNTER — Other Ambulatory Visit: Payer: Self-pay

## 2023-04-07 ENCOUNTER — Emergency Department (HOSPITAL_COMMUNITY)
Admission: EM | Admit: 2023-04-07 | Discharge: 2023-04-07 | Disposition: A | Discharge status: Home / Self Care | Attending: Emergency Medicine | Admitting: Emergency Medicine

## 2023-04-07 ENCOUNTER — Encounter (HOSPITAL_COMMUNITY): Payer: Self-pay | Admitting: *Deleted

## 2023-04-07 ENCOUNTER — Encounter (HOSPITAL_COMMUNITY): Payer: Self-pay | Admitting: Emergency Medicine

## 2023-04-07 DIAGNOSIS — M7918 Myalgia, other site: Secondary | ICD-10-CM | POA: Insufficient documentation

## 2023-04-07 DIAGNOSIS — M542 Cervicalgia: Secondary | ICD-10-CM | POA: Insufficient documentation

## 2023-04-07 DIAGNOSIS — G8929 Other chronic pain: Secondary | ICD-10-CM | POA: Insufficient documentation

## 2023-04-07 DIAGNOSIS — E119 Type 2 diabetes mellitus without complications: Secondary | ICD-10-CM | POA: Insufficient documentation

## 2023-04-07 DIAGNOSIS — R519 Headache, unspecified: Secondary | ICD-10-CM | POA: Insufficient documentation

## 2023-04-07 DIAGNOSIS — Z79899 Other long term (current) drug therapy: Secondary | ICD-10-CM | POA: Insufficient documentation

## 2023-04-07 DIAGNOSIS — Z7984 Long term (current) use of oral hypoglycemic drugs: Secondary | ICD-10-CM | POA: Insufficient documentation

## 2023-04-07 DIAGNOSIS — I10 Essential (primary) hypertension: Secondary | ICD-10-CM | POA: Insufficient documentation

## 2023-04-07 DIAGNOSIS — G8921 Chronic pain due to trauma: Secondary | ICD-10-CM | POA: Diagnosis not present

## 2023-04-07 MED ORDER — LIDOCAINE 5 % EX PTCH: 1.0000 | MEDICATED_PATCH | CUTANEOUS | 0 refills | Status: DC

## 2023-04-07 MED ORDER — AMLODIPINE BESYLATE 5 MG PO TABS
10.0000 mg | ORAL_TABLET | Freq: Once | ORAL | Status: AC
  Administered 2023-04-07: 10 mg via ORAL
  Filled 2023-04-07: qty 2

## 2023-04-07 MED ORDER — IBUPROFEN 400 MG PO TABS
600.0000 mg | ORAL_TABLET | Freq: Once | ORAL | Status: AC
  Administered 2023-04-07: 600 mg via ORAL
  Filled 2023-04-07: qty 1

## 2023-04-07 MED ORDER — AMLODIPINE BESYLATE 5 MG PO TABS
5.0000 mg | ORAL_TABLET | Freq: Once | ORAL | Status: AC
  Administered 2023-04-07: 5 mg via ORAL
  Filled 2023-04-07: qty 1

## 2023-04-07 MED ORDER — LIDOCAINE 5 % EX PTCH
1.0000 | MEDICATED_PATCH | CUTANEOUS | Status: DC
  Administered 2023-04-07: 1 via TRANSDERMAL
  Filled 2023-04-07: qty 1

## 2023-04-07 NOTE — ED Triage Notes (Signed)
 Pt here multiple times in the last 24hrs , told registation she is here for a headache but would not talk to triage nurse
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
## 2023-04-07 NOTE — ED Notes (Signed)
 Patient ambulated independently to the restroom.

## 2023-04-07 NOTE — ED Notes (Signed)
 Patient ambulated independently back to bed.

## 2023-04-07 NOTE — ED Triage Notes (Signed)
 Body p[ain hse here every night  3 or 4 times

## 2023-04-07 NOTE — ED Provider Notes (Signed)
 [place] EMERGENCY DEPARTMENT AT [facility] Provider Note   CSN: [id number] Arrival date & time: 04/07/23  9563     History  Chief Complaint  Patient presents with   Headache    [name] is a 78 y.o. female returning to the emergency department after discharge 2 hours ago with complaint of headache.  Patient is seen on a near daily basis, often multiple times a day for the same complaints.  There is nothing new about her symptoms.  She is anxious about the weather outside.  She says she does not see well in the dark and is requesting to stay in the ED until it is light outside.  HPI     Home Medications Prior to Admission medications   Medication Sig Start Date End Date Taking? Authorizing Provider  acetaminophen (TYLENOL) 500 MG tablet Take 2 tablets (1,000 mg total) by mouth every 6 (six) hours as needed for moderate pain (pain score 4-6). 01/13/23   Fayrene Helper, PA-C  albuterol (VENTOLIN HFA) 108 (90 Base) MCG/ACT inhaler Inhale 1-2 puffs into the lungs every 6 (six) hours as needed for wheezing or shortness of breath. 04/22/20   Barbette Merino, NP  amLODipine (NORVASC) 10 MG tablet Take 1 tablet (10 mg total) by mouth daily. 01/23/23   Roemhildt, Lorin T, PA-C  chlorthalidone (HYGROTON) 25 MG tablet TAKE 1 TABLET (25 MG TOTAL) BY MOUTH DAILY. 11/20/21 11/20/22  Donell Beers, FNP  diphenhydrAMINE (BENADRYL) 25 MG tablet Take 1 tablet (25 mg total) by mouth every 6 (six) hours as needed for itching or allergies. 10/03/22   Fayrene Helper, PA-C  hydrocortisone cream 1 % Apply to affected area 2 times daily 10/22/22   Garlon Hatchet, PA-C  hydrOXYzine (ATARAX) 25 MG tablet Take 1 tablet (25 mg total) by mouth every 8 (eight) hours as needed. 11/23/22   Henderly, Britni A, PA-C  lidocaine (LIDODERM) 5 % Place 1 patch onto the skin daily. Remove & Discard patch within 12 hours or as directed by MD 04/06/23   Evlyn Kanner T, PA-C  lidocaine (LIDODERM) 5 % Place 1 patch  onto the skin daily. Remove & Discard patch within 12 hours or as directed by MD 04/07/23   Nicanor Alcon, April, MD  loratadine (CLARITIN) 10 MG tablet Take 1 tablet (10 mg total) by mouth daily. 04/06/23   Barrett, Horald Chestnut, PA-C  metFORMIN (GLUCOPHAGE) 500 MG tablet Take 1 tablet (500 mg total) by mouth 2 (two) times daily with a meal. 11/03/22 11/03/23  Gareth Eagle, PA-C  methocarbamol (ROBAXIN) 500 MG tablet Take 1 tablet (500 mg total) by mouth 2 (two) times daily. 03/19/23   Durwin Glaze, MD  naproxen (NAPROSYN) 375 MG tablet Take 1 tablet (375 mg total) by mouth 2 (two) times daily. 03/17/23   Henderly, Britni A, PA-C  pantoprazole (PROTONIX) 20 MG tablet Take 1 tablet (20 mg total) by mouth daily. 10/10/21   [name], MD  triamcinolone cream (KENALOG) 0.1 % Apply 1 Application topically 2 (two) times daily. Apply to rash 09/27/22   Rondel Baton, MD      Allergies    Codeine, Ibuprofen, and Imdur [isosorbide nitrate]    Review of Systems   Review of Systems  Physical Exam Updated Vital Signs BP (!) 160/84 (BP Location: Right Arm)   Pulse 74   Temp 97.8 F (36.6 C)   Resp 15   SpO2 100%  Physical Exam Constitutional:  General: She is not in acute distress. HENT:     Head: Normocephalic and atraumatic.  Eyes:     Conjunctiva/sclera: Conjunctivae normal.     Pupils: Pupils are equal, round, and reactive to light.  Cardiovascular:     Rate and Rhythm: Normal rate and regular rhythm.  Pulmonary:     Effort: Pulmonary effort is normal. No respiratory distress.  Abdominal:     General: There is no distension.     Tenderness: There is no abdominal tenderness.  Skin:    General: Skin is warm and dry.  Neurological:     General: No focal deficit present.     Mental Status: She is alert. Mental status is at baseline.     ED Results / Procedures / Treatments   Labs (all labs ordered are listed, but only abnormal results are displayed) Labs Reviewed - No data to  display  EKG None  Radiology No results found.  Procedures Procedures    Medications Ordered in ED Medications  ibuprofen (ADVIL) tablet 600 mg (has no administration in time range)  amLODipine (NORVASC) tablet 10 mg (has no administration in time range)    ED Course/ Medical Decision Making/ A&P                                 Medical Decision Making Risk Prescription drug management.   Patient is here with chronic complaints of neck pain and headache.  No red flag symptoms for her headache.  I have ordered ibuprofen which she has been given in the past for headaches as well as her morning blood pressure medication.  We can watch her an hour until it is light outside for discharge.        Final Clinical Impression(s) / ED Diagnoses Final diagnoses:  Nonintractable headache, unspecified chronicity pattern, unspecified headache type    Rx / DC Orders ED Discharge Orders     None         Zacheriah Stumpe, Kermit Balo, MD 04/07/23 [phone]

## 2023-04-07 NOTE — Discharge Instructions (Signed)
 You are having a headache. This condition is very commonly seen in the Emergency Department. No specific cause was found today for your headache. It may have been a migraine or another cause of headache. Stress, anxiety, fatigue, and depression are common triggers for headaches.   Fortunately, your headache today does not appear to be life-threatening or require hospitalization at this time. This may change in the future. Sometimes headaches can appear benign (not harmful), but then more serious symptoms can develop, which should prompt an immediate re-evaluation by your doctor or the emergency department.  It is very important that you speak to your primary care doctor in the next 48 hours, to re-evaluate your symptoms, and to determine whether you need any further diagnostic testing or treatment.   SEEK MEDICAL ATTENTION IF: - you develop reaction or side effects to the medications prescribed.  - your headache is not improving within 48 hours - you have multiple episodes of vomiting or can't drink fluids - you have a change in pattern from your usual headache (eg. New location, new intensity, new symptoms, etc)  RETURN IMMEDIATELY IF you develop a sudden, severe worsening of your headache or confusion, become poorly responsive, feel like passing out, develop a fever above 100.2F, have a change in speech, vision, or swallowing, develop new weakness, numbness, tingling, or incoordination, or have a seizure.

## 2023-04-08 DIAGNOSIS — R519 Headache, unspecified: Secondary | ICD-10-CM | POA: Diagnosis not present

## 2023-04-08 MED ORDER — NAPROXEN 250 MG PO TABS
375.0000 mg | ORAL_TABLET | Freq: Once | ORAL | Status: AC
  Administered 2023-04-08: 375 mg via ORAL
  Filled 2023-04-08: qty 2

## 2023-04-08 MED ORDER — LIDOCAINE 5 % EX PTCH
1.0000 | MEDICATED_PATCH | CUTANEOUS | Status: DC
  Administered 2023-04-08: 1 via TRANSDERMAL
  Filled 2023-04-08: qty 1

## 2023-04-08 NOTE — Discharge Instructions (Addendum)
 Follow up with the clinic listed below and return to the ER with any new severe symptoms.
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
## 2023-04-09 ENCOUNTER — Emergency Department (HOSPITAL_COMMUNITY)
Admission: EM | Admit: 2023-04-09 | Discharge: 2023-04-10 | Disposition: A | Discharge status: Home / Self Care | Attending: Emergency Medicine | Admitting: Emergency Medicine

## 2023-04-09 ENCOUNTER — Encounter (HOSPITAL_COMMUNITY): Payer: Self-pay

## 2023-04-09 ENCOUNTER — Other Ambulatory Visit: Payer: Self-pay

## 2023-04-09 DIAGNOSIS — M79604 Pain in right leg: Secondary | ICD-10-CM | POA: Insufficient documentation

## 2023-04-09 DIAGNOSIS — Z7984 Long term (current) use of oral hypoglycemic drugs: Secondary | ICD-10-CM | POA: Insufficient documentation

## 2023-04-09 DIAGNOSIS — I10 Essential (primary) hypertension: Secondary | ICD-10-CM | POA: Diagnosis not present

## 2023-04-09 DIAGNOSIS — R42 Dizziness and giddiness: Secondary | ICD-10-CM | POA: Insufficient documentation

## 2023-04-09 DIAGNOSIS — E119 Type 2 diabetes mellitus without complications: Secondary | ICD-10-CM | POA: Diagnosis not present

## 2023-04-09 DIAGNOSIS — M25511 Pain in right shoulder: Secondary | ICD-10-CM | POA: Diagnosis not present

## 2023-04-09 DIAGNOSIS — M79605 Pain in left leg: Secondary | ICD-10-CM | POA: Insufficient documentation

## 2023-04-09 DIAGNOSIS — M542 Cervicalgia: Secondary | ICD-10-CM | POA: Diagnosis present

## 2023-04-09 DIAGNOSIS — Z79899 Other long term (current) drug therapy: Secondary | ICD-10-CM | POA: Insufficient documentation

## 2023-04-09 DIAGNOSIS — G8929 Other chronic pain: Secondary | ICD-10-CM | POA: Diagnosis not present

## 2023-04-09 NOTE — ED Notes (Signed)
 Patient requested to wait in waiting room, patient refused labs at this time.

## 2023-04-09 NOTE — ED Provider Triage Note (Signed)
 Emergency Medicine Provider Triage Evaluation Note  [name] , a 78 y.o. female  was evaluated in triage.  Pt complains of intermittent dizziness and constant pain.  Patient frequently seen for same.  Patient wearing neck and wrist brace..  Review of Systems  Positive: Arthralgias, intermittent dizziness Negative: Fever, chills, chest pain, shortness of breath  Physical Exam  There were no vitals taken for this visit. Gen:   Awake, no distress   Resp:  Normal effort  MSK:   Moves extremities without difficulty  Other:    Medical Decision Making  Medically screening exam initiated at 7:33 PM.  Appropriate orders placed.  [name] was informed that the remainder of the evaluation will be completed by another provider, this initial triage assessment does not replace that evaluation, and the importance of remaining in the ED until their evaluation is complete.  Labs ordered   Adriana Jenny, PA-C 04/09/23 1945
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
## 2023-04-10 ENCOUNTER — Other Ambulatory Visit: Payer: Self-pay

## 2023-04-10 ENCOUNTER — Encounter (HOSPITAL_COMMUNITY): Payer: Self-pay

## 2023-04-10 ENCOUNTER — Emergency Department (HOSPITAL_COMMUNITY)
Admission: EM | Admit: 2023-04-10 | Discharge: 2023-04-10 | Disposition: A | Discharge status: Home / Self Care | Source: Home / Self Care | Attending: Emergency Medicine | Admitting: Emergency Medicine

## 2023-04-10 DIAGNOSIS — M542 Cervicalgia: Secondary | ICD-10-CM | POA: Insufficient documentation

## 2023-04-10 DIAGNOSIS — Z7984 Long term (current) use of oral hypoglycemic drugs: Secondary | ICD-10-CM | POA: Insufficient documentation

## 2023-04-10 DIAGNOSIS — Z79899 Other long term (current) drug therapy: Secondary | ICD-10-CM | POA: Insufficient documentation

## 2023-04-10 DIAGNOSIS — G8929 Other chronic pain: Secondary | ICD-10-CM

## 2023-04-10 DIAGNOSIS — E119 Type 2 diabetes mellitus without complications: Secondary | ICD-10-CM | POA: Insufficient documentation

## 2023-04-10 DIAGNOSIS — I10 Essential (primary) hypertension: Secondary | ICD-10-CM | POA: Insufficient documentation

## 2023-04-10 DIAGNOSIS — M25511 Pain in right shoulder: Secondary | ICD-10-CM | POA: Insufficient documentation

## 2023-04-10 MED ORDER — NAPROXEN 250 MG PO TABS
500.0000 mg | ORAL_TABLET | Freq: Once | ORAL | Status: AC
  Administered 2023-04-10: 500 mg via ORAL
  Filled 2023-04-10: qty 2

## 2023-04-10 MED ORDER — ACETAMINOPHEN 500 MG PO TABS: 1000.0000 mg | ORAL_TABLET | Freq: Four times a day (QID) | ORAL | 0 refills | Status: DC | PRN

## 2023-04-10 NOTE — ED Notes (Signed)
 Pt refused labs.

## 2023-04-10 NOTE — Discharge Instructions (Signed)
Please use Tylenol for pain.  You may use 1000 mg of Tylenol every 6 hours.  Not to exceed 4 g of Tylenol within 24 hours.  

## 2023-04-10 NOTE — ED Triage Notes (Signed)
 PT arrives via POV. PT reports ongoing right shoulder pain and neck pain for quite some time now. PT AxOx4.
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
## 2023-04-11 ENCOUNTER — Emergency Department (HOSPITAL_COMMUNITY)
Admission: EM | Admit: 2023-04-11 | Discharge: 2023-04-11 | Disposition: A | Discharge status: Home / Self Care | Attending: Emergency Medicine | Admitting: Emergency Medicine

## 2023-04-11 ENCOUNTER — Other Ambulatory Visit: Payer: Self-pay

## 2023-04-11 ENCOUNTER — Encounter (HOSPITAL_COMMUNITY): Payer: Self-pay

## 2023-04-11 DIAGNOSIS — Z760 Encounter for issue of repeat prescription: Secondary | ICD-10-CM | POA: Diagnosis present

## 2023-04-11 DIAGNOSIS — Z79899 Other long term (current) drug therapy: Secondary | ICD-10-CM | POA: Insufficient documentation

## 2023-04-11 DIAGNOSIS — Z8673 Personal history of transient ischemic attack (TIA), and cerebral infarction without residual deficits: Secondary | ICD-10-CM | POA: Diagnosis not present

## 2023-04-11 DIAGNOSIS — E119 Type 2 diabetes mellitus without complications: Secondary | ICD-10-CM | POA: Insufficient documentation

## 2023-04-11 DIAGNOSIS — Z7984 Long term (current) use of oral hypoglycemic drugs: Secondary | ICD-10-CM | POA: Diagnosis not present

## 2023-04-11 DIAGNOSIS — I10 Essential (primary) hypertension: Secondary | ICD-10-CM | POA: Diagnosis not present

## 2023-04-11 NOTE — ED Triage Notes (Signed)
 Pt came in via POV requesting medication refills of her Clonidine, Naproxen & Losartan. States she does not need to see a Dr. today, just needs her medicine filled.

## 2023-04-11 NOTE — Discharge Instructions (Addendum)
 The medications that you have are expired and you no longer need them.  Continue to take your medications that you have at home and follow-up with your primary care doctor for further care.

## 2023-04-11 NOTE — ED Provider Notes (Signed)
 [place] EMERGENCY DEPARTMENT AT [facility] Provider Note   CSN: [id number] Arrival date & time: 04/11/23  1144     History  Chief Complaint  Patient presents with   Medication Refill    [name] is a 78 y.o. female.  The history is provided by the patient and medical records. No language interpreter was used.     78 year old female history of diabetes, prior stroke, hypertension, frequent ER visit, requesting for medication refill.  Patient request for medication refill including clonidine, naproxen, and losartan.  States that she gets all her medication through the ER as she does not have a primary care provider.  She is without any other complaint.  Home Medications Prior to Admission medications   Medication Sig Start Date End Date Taking? Authorizing Provider  acetaminophen (TYLENOL) 500 MG tablet Take 2 tablets (1,000 mg total) by mouth every 6 (six) hours as needed for moderate pain (pain score 4-6). 04/10/23   Prosperi, Christian H, PA-C  albuterol (VENTOLIN HFA) 108 (90 Base) MCG/ACT inhaler Inhale 1-2 puffs into the lungs every 6 (six) hours as needed for wheezing or shortness of breath. 04/22/20   Barbette Merino, NP  amLODipine (NORVASC) 10 MG tablet Take 1 tablet (10 mg total) by mouth daily. 01/23/23   Roemhildt, Lorin T, PA-C  chlorthalidone (HYGROTON) 25 MG tablet TAKE 1 TABLET (25 MG TOTAL) BY MOUTH DAILY. 11/20/21 11/20/22  Donell Beers, FNP  diphenhydrAMINE (BENADRYL) 25 MG tablet Take 1 tablet (25 mg total) by mouth every 6 (six) hours as needed for itching or allergies. 10/03/22   Fayrene Helper, PA-C  hydrocortisone cream 1 % Apply to affected area 2 times daily 10/22/22   Garlon Hatchet, PA-C  hydrOXYzine (ATARAX) 25 MG tablet Take 1 tablet (25 mg total) by mouth every 8 (eight) hours as needed. 11/23/22   Henderly, Britni A, PA-C  lidocaine (LIDODERM) 5 % Place 1 patch onto the skin daily. Remove & Discard patch within 12 hours or as directed by  MD 04/06/23   Evlyn Kanner T, PA-C  lidocaine (LIDODERM) 5 % Place 1 patch onto the skin daily. Remove & Discard patch within 12 hours or as directed by MD 04/07/23   Nicanor Alcon, April, MD  loratadine (CLARITIN) 10 MG tablet Take 1 tablet (10 mg total) by mouth daily. 04/06/23   Barrett, Horald Chestnut, PA-C  metFORMIN (GLUCOPHAGE) 500 MG tablet Take 1 tablet (500 mg total) by mouth 2 (two) times daily with a meal. 11/03/22 11/03/23  Gareth Eagle, PA-C  methocarbamol (ROBAXIN) 500 MG tablet Take 1 tablet (500 mg total) by mouth 2 (two) times daily. 03/19/23   Durwin Glaze, MD  naproxen (NAPROSYN) 375 MG tablet Take 1 tablet (375 mg total) by mouth 2 (two) times daily. 03/17/23   Henderly, Britni A, PA-C  pantoprazole (PROTONIX) 20 MG tablet Take 1 tablet (20 mg total) by mouth daily. 10/10/21   [name], MD  triamcinolone cream (KENALOG) 0.1 % Apply 1 Application topically 2 (two) times daily. Apply to rash 09/27/22   Rondel Baton, MD      Allergies    Codeine, Ibuprofen, and Imdur [isosorbide nitrate]    Review of Systems   Review of Systems  All other systems reviewed and are negative.   Physical Exam Updated Vital Signs BP (!) 160/72 (BP Location: Left Arm)   Pulse 79   Temp 97.9 F (36.6 C) (Oral)   Resp 16   SpO2 97%  Physical Exam Vitals and nursing note reviewed.  Constitutional:      General: She is not in acute distress.    Appearance: She is well-developed.  HENT:     Head: Atraumatic.  Eyes:     Conjunctiva/sclera: Conjunctivae normal.  Neck:     Comments: Wearing neck brace Pulmonary:     Effort: Pulmonary effort is normal.  Musculoskeletal:     Cervical back: Neck supple.  Skin:    Findings: No rash.  Neurological:     Mental Status: She is alert.  Psychiatric:        Mood and Affect: Mood normal.     ED Results / Procedures / Treatments   Labs (all labs ordered are listed, but only abnormal results are displayed) Labs Reviewed - No data to  display  EKG None  Radiology No results found.  Procedures Procedures    Medications Ordered in ED Medications - No data to display  ED Course/ Medical Decision Making/ A&P                                 Medical Decision Making  BP (!) 160/72 (BP Location: Left Arm)   Pulse 79   Temp 97.9 F (36.6 C) (Oral)   Resp 16   SpO2 97%   81:40 PM  77 year old female history of diabetes, prior stroke, hypertension, frequent ER visit, requesting for medication refill.  Patient request for medication refill including clonidine, naproxen, and losartan.  States that she gets all her medication through the ER as she does not have a primary care provider.  She is without any other complaint.  Patient resting comfortably in no acute discomfort.  Patient brought in the 3 pill bottles, clonidine, naproxen, and losartan.  These medication have expired.  She no longer needs them.  Encourage patient to follow-up with her primary care doctor for further care.          Final Clinical Impression(s) / ED Diagnoses Final diagnoses:  Medication refill    Rx / DC Orders ED Discharge Orders     None         Fayrene Helper, PA-C 04/11/23 1351    Tegeler, Canary Brim, MD 04/11/23 1725

## 2023-04-13 ENCOUNTER — Encounter (HOSPITAL_COMMUNITY): Payer: Self-pay

## 2023-04-13 ENCOUNTER — Emergency Department (HOSPITAL_COMMUNITY)
Admission: EM | Admit: 2023-04-13 | Discharge: 2023-04-13 | Disposition: A | Discharge status: Home / Self Care | Attending: Emergency Medicine | Admitting: Emergency Medicine

## 2023-04-13 DIAGNOSIS — M542 Cervicalgia: Secondary | ICD-10-CM | POA: Diagnosis present

## 2023-04-13 MED ORDER — ACETAMINOPHEN 500 MG PO TABS
1000.0000 mg | ORAL_TABLET | Freq: Once | ORAL | Status: AC
  Administered 2023-04-13: 1000 mg via ORAL
  Filled 2023-04-13: qty 2
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
## 2023-04-13 NOTE — ED Notes (Signed)
 ..  The patient is A&OX4, ambulatory at d/c with independent steady gait, NAD. Pt verbalized understanding of d/c instructions and follow up care.
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
## 2023-04-14 ENCOUNTER — Other Ambulatory Visit: Payer: Self-pay

## 2023-04-14 ENCOUNTER — Emergency Department (HOSPITAL_COMMUNITY)
Admission: EM | Admit: 2023-04-14 | Discharge: 2023-04-14 | Disposition: A | Discharge status: Home / Self Care | Attending: Emergency Medicine | Admitting: Emergency Medicine

## 2023-04-14 ENCOUNTER — Encounter (HOSPITAL_COMMUNITY): Payer: Self-pay | Admitting: Emergency Medicine

## 2023-04-14 DIAGNOSIS — M549 Dorsalgia, unspecified: Secondary | ICD-10-CM | POA: Insufficient documentation

## 2023-04-14 DIAGNOSIS — M791 Myalgia, unspecified site: Secondary | ICD-10-CM | POA: Diagnosis present

## 2023-04-14 DIAGNOSIS — I10 Essential (primary) hypertension: Secondary | ICD-10-CM | POA: Insufficient documentation

## 2023-04-14 DIAGNOSIS — E119 Type 2 diabetes mellitus without complications: Secondary | ICD-10-CM | POA: Diagnosis not present

## 2023-04-14 DIAGNOSIS — Z8673 Personal history of transient ischemic attack (TIA), and cerebral infarction without residual deficits: Secondary | ICD-10-CM | POA: Insufficient documentation

## 2023-04-14 DIAGNOSIS — M542 Cervicalgia: Secondary | ICD-10-CM | POA: Insufficient documentation

## 2023-04-14 DIAGNOSIS — W1830XA Fall on same level, unspecified, initial encounter: Secondary | ICD-10-CM | POA: Insufficient documentation

## 2023-04-14 DIAGNOSIS — W19XXXA Unspecified fall, initial encounter: Secondary | ICD-10-CM

## 2023-04-14 DIAGNOSIS — R52 Pain, unspecified: Secondary | ICD-10-CM

## 2023-04-14 DIAGNOSIS — Y92521 Bus station as the place of occurrence of the external cause: Secondary | ICD-10-CM | POA: Insufficient documentation

## 2023-04-14 MED ORDER — ACETAMINOPHEN 500 MG PO TABS
1000.0000 mg | ORAL_TABLET | Freq: Once | ORAL | Status: AC
  Administered 2023-04-14: 1000 mg via ORAL
  Filled 2023-04-14: qty 2

## 2023-04-14 MED ORDER — LIDOCAINE 5 % EX PTCH
1.0000 | MEDICATED_PATCH | CUTANEOUS | Status: DC
  Administered 2023-04-14: 1 via TRANSDERMAL
  Filled 2023-04-14: qty 1
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
## 2023-04-14 NOTE — ED Triage Notes (Signed)
 Pt reported fall a few weeks back. States has had pain all over since the fall, not getting better. [place]. Denies any other concerns

## 2023-04-14 NOTE — ED Provider Notes (Signed)
 [place] EMERGENCY DEPARTMENT AT [facility] Provider Note   CSN: [id number] Arrival date & time: 04/14/23  1625     History Chief Complaint  Patient presents with   Fall    HPI [name] is a 78 y.o. female presenting for back pain, neck pain. See HPI from yesterday.  Patient's recorded medical, surgical, social, medication list and allergies were reviewed in the Snapshot window as part of the initial history.   Review of Systems   Review of Systems  Constitutional:  Negative for chills and fever.  HENT:  Negative for ear pain and sore throat.   Eyes:  Negative for pain and visual disturbance.  Respiratory:  Negative for cough and shortness of breath.   Cardiovascular:  Negative for chest pain and palpitations.  Gastrointestinal:  Negative for abdominal pain and vomiting.  Genitourinary:  Negative for dysuria and hematuria.  Musculoskeletal:  Positive for back pain and myalgias. Negative for arthralgias.  Skin:  Negative for color change and rash.  Neurological:  Negative for seizures and syncope.  All other systems reviewed and are negative.   Physical Exam Updated Vital Signs BP (!) 169/99 (BP Location: Left Arm)   Pulse 91   Temp 98.2 F (36.8 C) (Oral)   Resp 18   Ht 5\' 4"  (1.626 m)   Wt 49 kg   SpO2 98%   BMI 18.54 kg/m  Physical Exam Constitutional:      General: She is not in acute distress.    Appearance: She is not ill-appearing or toxic-appearing.  HENT:     Head: Normocephalic and atraumatic.  Eyes:     Extraocular Movements: Extraocular movements intact.     Pupils: Pupils are equal, round, and reactive to light.  Cardiovascular:     Rate and Rhythm: Normal rate.  Pulmonary:     Effort: No respiratory distress.  Abdominal:     General: Abdomen is flat.  Musculoskeletal:        General: No swelling, deformity or signs of injury.     Cervical back: Normal range of motion. No rigidity.  Skin:    General: Skin is warm and dry.   Neurological:     General: No focal deficit present.     Mental Status: She is alert and oriented to person, place, and time.  Psychiatric:        Mood and Affect: Mood normal.      ED Course/ Medical Decision Making/ A&P    Procedures Procedures   Medications Ordered in ED Medications  acetaminophen (TYLENOL) tablet 1,000 mg (has no administration in time range)    Medical Decision Making:   78 year old female.  Benign exam once again.  No focal tenderness.  Diffuse tenderness.  Imaged before.  No new symptoms.  She is ambulatory tolerating p.o. intake on arrival. States that if she would get Tylenol she would feel better today. Patient treated otherwise stable for outpatient care management follow-up with PCP.   Disposition:  I have considered need for hospitalization, however, considering all of the above, I believe this patient is stable for discharge at this time.   Patient/family educated about specific return precautions for given chief complaint and symptoms.  Patient/family educated about follow-up with PCP.     Patient/family expressed understanding of return precautions and need for follow-up. Patient spoken to regarding all imaging and laboratory results and appropriate follow up for these results. All education provided in verbal form with additional information in written form.  Time was allowed for answering of patient questions. Patient discharged.    Clinical Impression:  1. Fall, initial encounter      Discharge   Final Clinical Impression(s) / ED Diagnoses Final diagnoses:  Fall, initial encounter    Rx / DC Orders ED Discharge Orders     None         Glyn Ade, MD 04/14/23 [phone]
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
## 2023-04-16 ENCOUNTER — Emergency Department (HOSPITAL_COMMUNITY)
Admission: EM | Admit: 2023-04-16 | Discharge: 2023-04-16 | Disposition: A | Discharge status: Home / Self Care | Attending: Emergency Medicine | Admitting: Emergency Medicine

## 2023-04-16 ENCOUNTER — Encounter (HOSPITAL_COMMUNITY): Payer: Self-pay | Admitting: Emergency Medicine

## 2023-04-16 ENCOUNTER — Other Ambulatory Visit: Payer: Self-pay

## 2023-04-16 ENCOUNTER — Emergency Department (HOSPITAL_COMMUNITY)
Admission: EM | Admit: 2023-04-16 | Discharge: 2023-04-17 | Disposition: A | Discharge status: Home / Self Care | Source: Home / Self Care | Attending: Emergency Medicine | Admitting: Emergency Medicine

## 2023-04-16 ENCOUNTER — Encounter (HOSPITAL_COMMUNITY): Payer: Self-pay

## 2023-04-16 DIAGNOSIS — Z79899 Other long term (current) drug therapy: Secondary | ICD-10-CM | POA: Insufficient documentation

## 2023-04-16 DIAGNOSIS — E119 Type 2 diabetes mellitus without complications: Secondary | ICD-10-CM | POA: Insufficient documentation

## 2023-04-16 DIAGNOSIS — M25531 Pain in right wrist: Secondary | ICD-10-CM | POA: Insufficient documentation

## 2023-04-16 DIAGNOSIS — I10 Essential (primary) hypertension: Secondary | ICD-10-CM | POA: Insufficient documentation

## 2023-04-16 DIAGNOSIS — R52 Pain, unspecified: Secondary | ICD-10-CM | POA: Diagnosis present

## 2023-04-16 DIAGNOSIS — G8929 Other chronic pain: Secondary | ICD-10-CM | POA: Insufficient documentation

## 2023-04-16 DIAGNOSIS — Z7984 Long term (current) use of oral hypoglycemic drugs: Secondary | ICD-10-CM | POA: Insufficient documentation

## 2023-04-16 DIAGNOSIS — M25511 Pain in right shoulder: Secondary | ICD-10-CM | POA: Insufficient documentation

## 2023-04-16 DIAGNOSIS — Z8673 Personal history of transient ischemic attack (TIA), and cerebral infarction without residual deficits: Secondary | ICD-10-CM | POA: Insufficient documentation

## 2023-04-16 MED ORDER — ACETAMINOPHEN 500 MG PO TABS
1000.0000 mg | ORAL_TABLET | Freq: Once | ORAL | Status: AC
  Administered 2023-04-16: 1000 mg via ORAL
  Filled 2023-04-16: qty 2

## 2023-04-16 MED ORDER — LIDOCAINE 5 % EX PTCH
1.0000 | MEDICATED_PATCH | CUTANEOUS | Status: DC
  Administered 2023-04-16: 1 via TRANSDERMAL
  Filled 2023-04-16: qty 1

## 2023-04-16 MED ORDER — LIDOCAINE 5 % EX PTCH: 1.0000 | MEDICATED_PATCH | CUTANEOUS | 0 refills | Status: DC
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
## 2023-04-16 NOTE — ED Provider Notes (Signed)
 [place] EMERGENCY DEPARTMENT AT [facility] Provider Note   CSN: [id number] Arrival date & time: 04/16/23  1946     History  Chief Complaint  Patient presents with   Generalized pain    Tuesday Terlecki is a 78 y.o. female patient with past medical history of hypertension, diabetes, chronic headaches presenting to emergency room with complaint of generalized bodyaches.  She reports that all of her pain started after she had a fall.  She is in no acute change in symptoms. Reports right wrist and right shoulder pain is chronic with no changes since prior visit. No new injury, trauma or falls. Patient ambulating with steady gait. Patient reports she has not tried anything for symptoms today. She wants bus pass and requesting something for shoulder pain.    HPI     Home Medications Prior to Admission medications   Medication Sig Start Date End Date Taking? Authorizing Provider  acetaminophen (TYLENOL) 500 MG tablet Take 2 tablets (1,000 mg total) by mouth every 6 (six) hours as needed for moderate pain (pain score 4-6). 04/10/23   Prosperi, Christian H, PA-C  albuterol (VENTOLIN HFA) 108 (90 Base) MCG/ACT inhaler Inhale 1-2 puffs into the lungs every 6 (six) hours as needed for wheezing or shortness of breath. 04/22/20   Barbette Merino, NP  amLODipine (NORVASC) 10 MG tablet Take 1 tablet (10 mg total) by mouth daily. 01/23/23   Roemhildt, Lorin T, PA-C  chlorthalidone (HYGROTON) 25 MG tablet TAKE 1 TABLET (25 MG TOTAL) BY MOUTH DAILY. 11/20/21 11/20/22  Donell Beers, FNP  diphenhydrAMINE (BENADRYL) 25 MG tablet Take 1 tablet (25 mg total) by mouth every 6 (six) hours as needed for itching or allergies. 10/03/22   Fayrene Helper, PA-C  hydrocortisone cream 1 % Apply to affected area 2 times daily 10/22/22   Garlon Hatchet, PA-C  hydrOXYzine (ATARAX) 25 MG tablet Take 1 tablet (25 mg total) by mouth every 8 (eight) hours as needed. 11/23/22   Henderly, Britni A, PA-C  lidocaine  (LIDODERM) 5 % Place 1 patch onto the skin daily. Remove & Discard patch within 12 hours or as directed by MD 04/06/23   Evlyn Kanner T, PA-C  lidocaine (LIDODERM) 5 % Place 1 patch onto the skin daily. Remove & Discard patch within 12 hours or as directed by MD 04/07/23   Nicanor Alcon, April, MD  loratadine (CLARITIN) 10 MG tablet Take 1 tablet (10 mg total) by mouth daily. 04/06/23   Lilliemae Fruge, Horald Chestnut, PA-C  metFORMIN (GLUCOPHAGE) 500 MG tablet Take 1 tablet (500 mg total) by mouth 2 (two) times daily with a meal. 11/03/22 11/03/23  Gareth Eagle, PA-C  methocarbamol (ROBAXIN) 500 MG tablet Take 1 tablet (500 mg total) by mouth 2 (two) times daily. 03/19/23   Durwin Glaze, MD  naproxen (NAPROSYN) 375 MG tablet Take 1 tablet (375 mg total) by mouth 2 (two) times daily. 03/17/23   Henderly, Britni A, PA-C  pantoprazole (PROTONIX) 20 MG tablet Take 1 tablet (20 mg total) by mouth daily. 10/10/21   [name], MD  triamcinolone cream (KENALOG) 0.1 % Apply 1 Application topically 2 (two) times daily. Apply to rash 09/27/22   Rondel Baton, MD      Allergies    Codeine, Ibuprofen, and Imdur [isosorbide nitrate]    Review of Systems   Review of Systems  Musculoskeletal:  Positive for arthralgias.    Physical Exam Updated Vital Signs BP (!) 171/78 (BP Location: Right  Arm)   Pulse 88   Temp 97.9 F (36.6 C)   Resp 16   SpO2 100%  Physical Exam Vitals and nursing note reviewed.  Constitutional:      General: She is not in acute distress.    Appearance: She is not toxic-appearing.     Comments: Seen in bed resting comfortably.  HENT:     Head: Normocephalic and atraumatic.  Eyes:     General: No scleral icterus.    Conjunctiva/sclera: Conjunctivae normal.  Cardiovascular:     Rate and Rhythm: Normal rate and regular rhythm.     Pulses: Normal pulses.     Heart sounds: Normal heart sounds.  Pulmonary:     Effort: Pulmonary effort is normal. No respiratory distress.     Breath  sounds: Normal breath sounds.  Abdominal:     General: Abdomen is flat. Bowel sounds are normal.     Palpations: Abdomen is soft.     Tenderness: There is no abdominal tenderness.  Musculoskeletal:     Right lower leg: No edema.     Left lower leg: No edema.     Comments: Soft collar in place. ACE bandage right wrist.   Moving extremities without difficulty without any focal area of tenderness or deformity.  No obvious ecchymosis.  Skin:    General: Skin is warm and dry.     Findings: No lesion.  Neurological:     General: No focal deficit present.     Mental Status: She is alert and oriented to person, place, and time. Mental status is at baseline.     ED Results / Procedures / Treatments   Labs (all labs ordered are listed, but only abnormal results are displayed) Labs Reviewed - No data to display  EKG None  Radiology No results found.  Procedures Procedures    Medications Ordered in ED Medications - No data to display  ED Course/ Medical Decision Making/ A&P                                 Medical Decision Making Risk OTC drugs. Prescription drug management.   This patient presents to the ED for concern of fall, this involves an extensive number of treatment options, and is a complaint that carries with it a high risk of complications and morbidity.  The differential diagnosis includes intracranial hemorrhage, concussion, fracture, dislocation   Problem List / ED Course / Critical interventions / Medication management  Patient has had multiple ED visits for similar symptoms of chronic pain post fall.  She is presenting today with similar complaint right shoulder pain and right wrist pain.  She has not taken anything today for her symptoms.  She has had no acute change in her symptoms and no recent injury trauma or fall.  She is neurovascularly intact and moving extremities without any difficulty.  Given that she has not had any new falls and has no acute  change in symptoms do not feel like imaging is necessary at this time.  She is hemodynamically stable and well-appearing.  Will give lidocaine patch and Tylenol.  Feel this is likely secondary to chronic pain.  Encouraged her to follow-up with primary care.  Given return precautions. Stable for discharge.  I ordered medication including Lidoderm, tylenol  Reevaluation of the patient after these medicines showed that the patient improved I have reviewed the patients home medicines and have made adjustments as  needed           Final Clinical Impression(s) / ED Diagnoses Final diagnoses:  Other chronic pain    Rx / DC Orders ED Discharge Orders     None         Raford Pitcher Evalee Jefferson 04/16/23 2129    Gloris Manchester, MD 04/17/23 [phone]

## 2023-04-16 NOTE — ED Notes (Signed)
Patient verbalizes understanding of discharge instructions. Opportunity for questioning and answers were provided. Armband removed by staff, pt discharged from ED. Ambulated out to lobby  

## 2023-04-16 NOTE — ED Triage Notes (Signed)
 Pt in returns with reported pain to arms, legs and feet. Discharged 1hr ago and pt states Lido patch and Tylenol were not effective

## 2023-04-16 NOTE — Discharge Instructions (Signed)
 Your blood pressure is elevated, please take blood pressure medications and follow up with primary care.

## 2023-04-17 MED ORDER — ACETAMINOPHEN 500 MG PO TABS
1000.0000 mg | ORAL_TABLET | Freq: Once | ORAL | Status: AC
  Administered 2023-04-17: 1000 mg via ORAL
  Filled 2023-04-17: qty 2

## 2023-04-17 NOTE — Discharge Instructions (Signed)
 You were seen today for your chronic pain.  You are given a dose of Tylenol.  You need to follow-up with your primary care provider.  If you do not have one, follow-up at [facility].

## 2023-04-17 NOTE — ED Provider Notes (Signed)
 [place] EMERGENCY DEPARTMENT AT [facility] Provider Note   CSN: [id number] Arrival date & time: 04/16/23  2242     History  Chief Complaint  Patient presents with   Generalized Pain    [name] is a 78 y.o. female.  HPI     This is a 78 year old female who presents with pain.  Patient is well-known to our emergency department.  She has had 80 emergency room visits in the last 6 months with no admissions.  She generally has seen at least once daily for ongoing pain.  Today she is reporting foot and leg pain.  This is unchanged and not new.  She denies new injury.  She is taking Tylenol with no relief.  Home Medications Prior to Admission medications   Medication Sig Start Date End Date Taking? Authorizing Provider  acetaminophen (TYLENOL) 500 MG tablet Take 2 tablets (1,000 mg total) by mouth every 6 (six) hours as needed for moderate pain (pain score 4-6). 04/10/23   Prosperi, Christian H, PA-C  albuterol (VENTOLIN HFA) 108 (90 Base) MCG/ACT inhaler Inhale 1-2 puffs into the lungs every 6 (six) hours as needed for wheezing or shortness of breath. 04/22/20   Barbette Merino, NP  amLODipine (NORVASC) 10 MG tablet Take 1 tablet (10 mg total) by mouth daily. 01/23/23   Roemhildt, Lorin T, PA-C  chlorthalidone (HYGROTON) 25 MG tablet TAKE 1 TABLET (25 MG TOTAL) BY MOUTH DAILY. 11/20/21 11/20/22  Donell Beers, FNP  diphenhydrAMINE (BENADRYL) 25 MG tablet Take 1 tablet (25 mg total) by mouth every 6 (six) hours as needed for itching or allergies. 10/03/22   Fayrene Helper, PA-C  hydrocortisone cream 1 % Apply to affected area 2 times daily 10/22/22   Garlon Hatchet, PA-C  hydrOXYzine (ATARAX) 25 MG tablet Take 1 tablet (25 mg total) by mouth every 8 (eight) hours as needed. 11/23/22   Henderly, Britni A, PA-C  lidocaine (LIDODERM) 5 % Place 1 patch onto the skin daily. Remove & Discard patch within 12 hours or as directed by MD 04/16/23   Barrett, Horald Chestnut, PA-C  loratadine  (CLARITIN) 10 MG tablet Take 1 tablet (10 mg total) by mouth daily. 04/06/23   Barrett, Horald Chestnut, PA-C  metFORMIN (GLUCOPHAGE) 500 MG tablet Take 1 tablet (500 mg total) by mouth 2 (two) times daily with a meal. 11/03/22 11/03/23  Gareth Eagle, PA-C  methocarbamol (ROBAXIN) 500 MG tablet Take 1 tablet (500 mg total) by mouth 2 (two) times daily. 03/19/23   Durwin Glaze, MD  naproxen (NAPROSYN) 375 MG tablet Take 1 tablet (375 mg total) by mouth 2 (two) times daily. 03/17/23   Henderly, Britni A, PA-C  pantoprazole (PROTONIX) 20 MG tablet Take 1 tablet (20 mg total) by mouth daily. 10/10/21   [name], MD  triamcinolone cream (KENALOG) 0.1 % Apply 1 Application topically 2 (two) times daily. Apply to rash 09/27/22   Rondel Baton, MD      Allergies    Codeine, Ibuprofen, and Imdur [isosorbide nitrate]    Review of Systems   Review of Systems  Musculoskeletal:        Leg pain  All other systems reviewed and are negative.   Physical Exam Updated Vital Signs BP (!) 165/70 (BP Location: Right Arm)   Pulse 85   Temp 98 F (36.7 C) (Oral)   Resp 17   Wt 49 kg   SpO2 98%   BMI 18.54 kg/m  Physical  Exam Vitals and nursing note reviewed.  Constitutional:      Appearance: She is well-developed. She is not ill-appearing.  HENT:     Head: Normocephalic and atraumatic.     Mouth/Throat:     Mouth: Mucous membranes are moist.  Eyes:     Extraocular Movements: Extraocular movements intact.     Pupils: Pupils are equal, round, and reactive to light.  Neck:     Comments: Soft collar in place Cardiovascular:     Rate and Rhythm: Normal rate and regular rhythm.  Pulmonary:     Effort: Pulmonary effort is normal. No respiratory distress.  Abdominal:     Palpations: Abdomen is soft.  Musculoskeletal:        General: No deformity.     Right lower leg: No edema.     Left lower leg: No edema.     Comments: Bilateral 2+ DP pulse  Skin:    General: Skin is warm and dry.   Neurological:     Mental Status: She is alert and oriented to person, place, and time.  Psychiatric:        Mood and Affect: Mood normal.     ED Results / Procedures / Treatments   Labs (all labs ordered are listed, but only abnormal results are displayed) Labs Reviewed - No data to display  EKG None  Radiology No results found.  Procedures Procedures    Medications Ordered in ED Medications  acetaminophen (TYLENOL) tablet 1,000 mg (has no administration in time range)    ED Course/ Medical Decision Making/ A&P                                 Medical Decision Making Risk OTC drugs.   This patient presents to the ED for concern of pain, this involves an extensive number of treatment options, and is a complaint that carries with it a high risk of complications and morbidity.  I considered the following differential and admission for this acute, potentially life threatening condition.  The differential diagnosis includes chronic pain, injury  MDM:    This is a 78 year old female who presents with pain.  Last seen 5 hours ago in the emergency department.  Her vital signs are at her baseline.  She appears at her baseline.  These are chronic complaints.  Low suspicion for acute emergent process.  (Labs, imaging, consults)  Labs: I Ordered, and personally interpreted labs.  The pertinent results include: N/A  Imaging Studies ordered: I ordered imaging studies including N/A I independently visualized and interpreted imaging. I agree with the radiologist interpretation  Additional history obtained from chart review.  External records from outside source obtained and reviewed including prior evaluations  Cardiac Monitoring: The patient was not maintained on a cardiac monitor.  If on the cardiac monitor, I personally viewed and interpreted the cardiac monitored which showed an underlying rhythm of: N/A  Reevaluation: After the interventions noted above, I reevaluated  the patient and found that they have :stayed the same  Social Determinants of Health:  lives independently, difficulty with maintaining electric/water at home  Disposition: Discharge  Co morbidities that complicate the patient evaluation  Past Medical History:  Diagnosis Date   Diabetes mellitus without complication (HCC)    GERD 09/04/2006   Qualifier: Diagnosis of  By: Duke Salvia     History of echocardiogram    a. 2D ECHO: 11/06/2013 EF 65%; no WMA.  Mild TR. PA pk pressure 43 mm HG   Hypertension    Stroke (HCC)      Medicines Meds ordered this encounter  Medications   acetaminophen (TYLENOL) tablet 1,000 mg    I have reviewed the patients home medicines and have made adjustments as needed  Problem List / ED Course: Problem List Items Addressed This Visit   None Visit Diagnoses       Other chronic pain    -  Primary   Relevant Medications   acetaminophen (TYLENOL) tablet 1,000 mg                   Final Clinical Impression(s) / ED Diagnoses Final diagnoses:  Other chronic pain    Rx / DC Orders ED Discharge Orders     None         Shon Baton, MD 04/17/23 [phone]

## 2023-04-18 ENCOUNTER — Emergency Department (HOSPITAL_COMMUNITY)
Admission: EM | Admit: 2023-04-18 | Discharge: 2023-04-18 | Disposition: A | Discharge status: Home / Self Care | Attending: Emergency Medicine | Admitting: Emergency Medicine

## 2023-04-18 ENCOUNTER — Emergency Department (HOSPITAL_COMMUNITY)
Admission: EM | Admit: 2023-04-18 | Discharge: 2023-04-19 | Disposition: A | Discharge status: Home / Self Care | Source: Home / Self Care | Attending: Emergency Medicine | Admitting: Emergency Medicine

## 2023-04-18 ENCOUNTER — Other Ambulatory Visit: Payer: Self-pay

## 2023-04-18 ENCOUNTER — Encounter (HOSPITAL_COMMUNITY): Payer: Self-pay | Admitting: Emergency Medicine

## 2023-04-18 DIAGNOSIS — R52 Pain, unspecified: Secondary | ICD-10-CM

## 2023-04-18 DIAGNOSIS — W19XXXA Unspecified fall, initial encounter: Secondary | ICD-10-CM | POA: Insufficient documentation

## 2023-04-18 DIAGNOSIS — Z79899 Other long term (current) drug therapy: Secondary | ICD-10-CM | POA: Diagnosis not present

## 2023-04-18 DIAGNOSIS — M791 Myalgia, unspecified site: Secondary | ICD-10-CM | POA: Insufficient documentation

## 2023-04-18 DIAGNOSIS — Y92811 Bus as the place of occurrence of the external cause: Secondary | ICD-10-CM | POA: Diagnosis not present

## 2023-04-18 DIAGNOSIS — I10 Essential (primary) hypertension: Secondary | ICD-10-CM | POA: Insufficient documentation

## 2023-04-18 DIAGNOSIS — G8929 Other chronic pain: Secondary | ICD-10-CM

## 2023-04-18 MED ORDER — LIDOCAINE 5 % EX PTCH
1.0000 | MEDICATED_PATCH | CUTANEOUS | Status: DC
  Administered 2023-04-18: 1 via TRANSDERMAL
  Filled 2023-04-18: qty 1

## 2023-04-18 MED ORDER — ACETAMINOPHEN 325 MG PO TABS
650.0000 mg | ORAL_TABLET | Freq: Once | ORAL | Status: AC | PRN
  Administered 2023-04-18: 650 mg via ORAL
  Filled 2023-04-18: qty 2

## 2023-04-18 NOTE — ED Triage Notes (Signed)
 Pt reports generalized body aches and headaches. Reports being hungry and not having anything to eat today. No cough/fevers/chills.

## 2023-04-18 NOTE — ED Provider Notes (Signed)
 Emergency Department Provider Note   I have reviewed the triage vital signs and the nursing notes.   HISTORY  Chief Complaint Pain   HPI [name] is a 78 y.o. female presents to the emergency department with continued total body pain.  She has frequent ED visits for the same.  She has not taken anything prior to arrival such as Tylenol.  Describes fall on the bus causing her to be hurt.  No new or different symptoms from her last visits.   Past Medical History:  Diagnosis Date   Diabetes mellitus without complication (HCC)    GERD 09/04/2006   Qualifier: Diagnosis of  By: Duke Salvia     History of echocardiogram    a. 2D ECHO: 11/06/2013 EF 65%; no WMA. Mild TR. PA pk pressure 43 mm HG   Hypertension    Stroke [facility])     Review of Systems  Constitutional: No fever/chills Cardiovascular: Denies chest pain. Respiratory: Denies shortness of breath. Gastrointestinal: No abdominal pain.  Musculoskeletal: Positive total body pain.  Skin: Negative for rash. Neurological: Negative for headaches.  ____________________________________________   PHYSICAL EXAM:  VITAL SIGNS: ED Triage Vitals  Encounter Vitals Group     BP 04/18/23 1557 (!) 155/76     Pulse Rate 04/18/23 1557 83     Resp 04/18/23 1557 19     Temp 04/18/23 1557 98.6 F (37 C)     Temp Source 04/18/23 1557 Oral     SpO2 04/18/23 1557 100 %     Weight 04/18/23 1556 108 lb (49 kg)     Height 04/18/23 1556 5\' 4"  (1.626 m)   Constitutional: Alert and oriented. Well appearing and in no acute distress. Eyes: Conjunctivae are normal.  Head: Atraumatic. Nose: No congestion/rhinnorhea. Mouth/Throat: Mucous membranes are moist. Neck: No stridor. No cervical spine tenderness to palpation. Cardiovascular: Normal rate, regular rhythm. Good peripheral circulation. Grossly normal heart sounds.   Respiratory: Normal respiratory effort.  No retractions. Lungs CTAB. Gastrointestinal: No distention.   Musculoskeletal: No gross deformities of extremities. Neurologic:  Normal speech and language.  Skin:  Skin is warm, dry and intact. No rash noted.  ____________________________________________   PROCEDURES  Procedure(s) performed:   Procedures  None  ____________________________________________   INITIAL IMPRESSION / ASSESSMENT AND PLAN / ED COURSE  Pertinent labs & imaging results that were available during my care of the patient were reviewed by me and considered in my medical decision making (see chart for details).   This patient is Presenting for Evaluation of pain, which does require a range of treatment options, and is a complaint that involves a moderate risk of morbidity and mortality.  The Differential Diagnoses include chronic pain, arthritis, fracture, dislocation, contusion, etc.  Critical Interventions-    Medications - No data to display   Reassessment after intervention: pain improved.   Medical Decision Making: Summary:  Patient presents emergency department valuation of chronic pain.  She been seen in the emergency department almost daily for some time now with similar complaints.  I had seen her several times for this and do not find her presentation to be new or different on today's assessment.  Do not plan for additional imaging.  Offered Tylenol but she declined.  She would like a Lidoderm patch which I have provided.  Encouraged her to please follow closely with her PCP who can help him manage some of the symptoms as an outpatient to prevent daily ED visits.  She does not appear  to require emergent psych evaluation today.   Patient's presentation is most consistent with exacerbation of chronic illness.   Disposition: discharge  ____________________________________________  FINAL CLINICAL IMPRESSION(S) / ED DIAGNOSES  Final diagnoses:  Other chronic pain   Note:  This document was prepared using Dragon voice recognition software and may include  unintentional dictation errors.  Alona Bene, MD, [facility]    [name], Arlyss Repress, MD 04/26/23 [phone]

## 2023-04-18 NOTE — Discharge Instructions (Signed)
 Please follow with your primary care doctor in the coming week.

## 2023-04-18 NOTE — ED Notes (Signed)
 Pt discharge papers reviewed. Pt states she is going to sit in the lobby because she is not able to go home right now.

## 2023-04-18 NOTE — ED Triage Notes (Signed)
 Pt states since she fell on the bus her body has been hurting. Ambulatory in triage.

## 2023-04-19 ENCOUNTER — Encounter (HOSPITAL_COMMUNITY): Payer: Self-pay

## 2023-04-19 ENCOUNTER — Emergency Department (HOSPITAL_COMMUNITY)
Admission: EM | Admit: 2023-04-19 | Discharge: 2023-04-19 | Disposition: A | Discharge status: Home / Self Care | Attending: Emergency Medicine | Admitting: Emergency Medicine

## 2023-04-19 ENCOUNTER — Other Ambulatory Visit: Payer: Self-pay

## 2023-04-19 DIAGNOSIS — G8929 Other chronic pain: Secondary | ICD-10-CM

## 2023-04-19 DIAGNOSIS — M791 Myalgia, unspecified site: Secondary | ICD-10-CM | POA: Diagnosis present

## 2023-04-19 MED ORDER — ACETAMINOPHEN 325 MG PO TABS
650.0000 mg | ORAL_TABLET | Freq: Four times a day (QID) | ORAL | Status: DC | PRN
  Administered 2023-04-19: 650 mg via ORAL
  Filled 2023-04-19: qty 2

## 2023-04-19 MED ORDER — ACETAMINOPHEN 325 MG PO TABS
650.0000 mg | ORAL_TABLET | Freq: Once | ORAL | Status: AC
  Administered 2023-04-19: 650 mg via ORAL
  Filled 2023-04-19: qty 2

## 2023-04-19 MED ORDER — ACETAMINOPHEN 500 MG PO TABS
1000.0000 mg | ORAL_TABLET | Freq: Once | ORAL | Status: DC
Start: 2023-04-19 — End: 2023-04-19

## 2023-04-19 MED ORDER — AMLODIPINE BESYLATE 5 MG PO TABS
10.0000 mg | ORAL_TABLET | Freq: Once | ORAL | Status: AC
  Administered 2023-04-19: 10 mg via ORAL
  Filled 2023-04-19: qty 2

## 2023-04-19 NOTE — ED Notes (Addendum)
 Pt acuity increased after noticing repeat BP 203/81 taken by sort NT. Pt brought back for EDP evaluation.

## 2023-04-19 NOTE — ED Provider Notes (Signed)
 [place] EMERGENCY DEPARTMENT AT [facility] Provider Note   CSN: [id number] Arrival date & time: 04/19/23  1317     History  Chief Complaint  Patient presents with   Generalized Body Aches    [name] is a 78 y.o. female.  78 year old female who presents with chronic pain.  Patient is well-known to this department and has multiple visits for similar symptoms.  Denies any recent trauma.       Home Medications Prior to Admission medications   Medication Sig Start Date End Date Taking? Authorizing Provider  acetaminophen (TYLENOL) 500 MG tablet Take 2 tablets (1,000 mg total) by mouth every 6 (six) hours as needed for moderate pain (pain score 4-6). 04/10/23   Prosperi, Christian H, PA-C  albuterol (VENTOLIN HFA) 108 (90 Base) MCG/ACT inhaler Inhale 1-2 puffs into the lungs every 6 (six) hours as needed for wheezing or shortness of breath. 04/22/20   Barbette Merino, NP  amLODipine (NORVASC) 10 MG tablet Take 1 tablet (10 mg total) by mouth daily. 01/23/23   Roemhildt, Lorin T, PA-C  chlorthalidone (HYGROTON) 25 MG tablet TAKE 1 TABLET (25 MG TOTAL) BY MOUTH DAILY. 11/20/21 11/20/22  Donell Beers, FNP  diphenhydrAMINE (BENADRYL) 25 MG tablet Take 1 tablet (25 mg total) by mouth every 6 (six) hours as needed for itching or allergies. 10/03/22   Fayrene Helper, PA-C  hydrocortisone cream 1 % Apply to affected area 2 times daily 10/22/22   Garlon Hatchet, PA-C  hydrOXYzine (ATARAX) 25 MG tablet Take 1 tablet (25 mg total) by mouth every 8 (eight) hours as needed. 11/23/22   Henderly, Britni A, PA-C  lidocaine (LIDODERM) 5 % Place 1 patch onto the skin daily. Remove & Discard patch within 12 hours or as directed by MD 04/16/23   Barrett, Horald Chestnut, PA-C  loratadine (CLARITIN) 10 MG tablet Take 1 tablet (10 mg total) by mouth daily. 04/06/23   Barrett, Horald Chestnut, PA-C  metFORMIN (GLUCOPHAGE) 500 MG tablet Take 1 tablet (500 mg total) by mouth 2 (two) times daily with a meal.  11/03/22 11/03/23  Gareth Eagle, PA-C  methocarbamol (ROBAXIN) 500 MG tablet Take 1 tablet (500 mg total) by mouth 2 (two) times daily. 03/19/23   Durwin Glaze, MD  naproxen (NAPROSYN) 375 MG tablet Take 1 tablet (375 mg total) by mouth 2 (two) times daily. 03/17/23   Henderly, Britni A, PA-C  pantoprazole (PROTONIX) 20 MG tablet Take 1 tablet (20 mg total) by mouth daily. 10/10/21   [name], MD  triamcinolone cream (KENALOG) 0.1 % Apply 1 Application topically 2 (two) times daily. Apply to rash 09/27/22   Rondel Baton, MD      Allergies    Codeine, Ibuprofen, and Imdur [isosorbide nitrate]    Review of Systems   Review of Systems  All other systems reviewed and are negative.   Physical Exam Updated Vital Signs BP (!) 160/67 (BP Location: Left Arm)   Pulse 79   Temp 98.4 F (36.9 C) (Oral)   Resp 16   Ht 1.626 m (5\' 4" )   Wt 49 kg   SpO2 98%   BMI 18.54 kg/m  Physical Exam Vitals and nursing note reviewed.  Constitutional:      General: She is not in acute distress.    Appearance: Normal appearance. She is well-developed. She is not toxic-appearing.  HENT:     Head: Normocephalic and atraumatic.  Eyes:     General: Lids  are normal.     Conjunctiva/sclera: Conjunctivae normal.     Pupils: Pupils are equal, round, and reactive to light.  Neck:     Thyroid: No thyroid mass.     Trachea: No tracheal deviation.  Cardiovascular:     Rate and Rhythm: Normal rate and regular rhythm.     Heart sounds: Normal heart sounds. No murmur heard.    No gallop.  Pulmonary:     Effort: Pulmonary effort is normal. No respiratory distress.     Breath sounds: Normal breath sounds. No stridor. No decreased breath sounds, wheezing, rhonchi or rales.  Abdominal:     General: There is no distension.     Palpations: Abdomen is soft.     Tenderness: There is no abdominal tenderness. There is no rebound.  Musculoskeletal:        General: No tenderness. Normal range of motion.      Cervical back: Normal range of motion and neck supple.  Skin:    General: Skin is warm and dry.     Findings: No abrasion or rash.  Neurological:     Mental Status: She is alert and oriented to person, place, and time. Mental status is at baseline.     GCS: GCS eye subscore is 4. GCS verbal subscore is 5. GCS motor subscore is 6.     Cranial Nerves: No cranial nerve deficit.     Sensory: No sensory deficit.     Motor: Motor function is intact.  Psychiatric:        Attention and Perception: Attention normal.        Speech: Speech normal.        Behavior: Behavior normal.     ED Results / Procedures / Treatments   Labs (all labs ordered are listed, but only abnormal results are displayed) Labs Reviewed - No data to display  EKG None  Radiology No results found.  Procedures Procedures    Medications Ordered in ED Medications  acetaminophen (TYLENOL) tablet 650 mg (has no administration in time range)    ED Course/ Medical Decision Making/ A&P                                 Medical Decision Making Risk OTC drugs.   Patient here with ongoing chronic pain.  This is an unfortunate case.  Given Tylenol here.  Will give referral to the community wellness center        Final Clinical Impression(s) / ED Diagnoses Final diagnoses:  None    Rx / DC Orders ED Discharge Orders     None         Lorre Nick, MD 04/19/23 1350

## 2023-04-19 NOTE — ED Provider Notes (Signed)
 [place] EMERGENCY DEPARTMENT AT [facility] Provider Note   CSN: [id number] Arrival date & time: 04/18/23  1944     History  Chief Complaint  Patient presents with   Generalized Body Aches    [name] is a 78 y.o. female.  Here with 2-3 weeks of same pain she has had after reportedly fallen [place] a city bus. Generalized body aches. No new symptoms. No change in quality or severity of symptoms.         Home Medications Prior to Admission medications   Medication Sig Start Date End Date Taking? Authorizing Provider  acetaminophen (TYLENOL) 500 MG tablet Take 2 tablets (1,000 mg total) by mouth every 6 (six) hours as needed for moderate pain (pain score 4-6). 04/10/23   Prosperi, Christian H, PA-C  albuterol (VENTOLIN HFA) 108 (90 Base) MCG/ACT inhaler Inhale 1-2 puffs into the lungs every 6 (six) hours as needed for wheezing or shortness of breath. 04/22/20   Barbette Merino, NP  amLODipine (NORVASC) 10 MG tablet Take 1 tablet (10 mg total) by mouth daily. 01/23/23   Roemhildt, Lorin T, PA-C  chlorthalidone (HYGROTON) 25 MG tablet TAKE 1 TABLET (25 MG TOTAL) BY MOUTH DAILY. 11/20/21 11/20/22  Donell Beers, FNP  diphenhydrAMINE (BENADRYL) 25 MG tablet Take 1 tablet (25 mg total) by mouth every 6 (six) hours as needed for itching or allergies. 10/03/22   Fayrene Helper, PA-C  hydrocortisone cream 1 % Apply to affected area 2 times daily 10/22/22   Garlon Hatchet, PA-C  hydrOXYzine (ATARAX) 25 MG tablet Take 1 tablet (25 mg total) by mouth every 8 (eight) hours as needed. 11/23/22   Henderly, Britni A, PA-C  lidocaine (LIDODERM) 5 % Place 1 patch onto the skin daily. Remove & Discard patch within 12 hours or as directed by MD 04/16/23   Barrett, Horald Chestnut, PA-C  loratadine (CLARITIN) 10 MG tablet Take 1 tablet (10 mg total) by mouth daily. 04/06/23   Barrett, Horald Chestnut, PA-C  metFORMIN (GLUCOPHAGE) 500 MG tablet Take 1 tablet (500 mg total) by mouth 2 (two) times daily with a  meal. 11/03/22 11/03/23  Gareth Eagle, PA-C  methocarbamol (ROBAXIN) 500 MG tablet Take 1 tablet (500 mg total) by mouth 2 (two) times daily. 03/19/23   Durwin Glaze, MD  naproxen (NAPROSYN) 375 MG tablet Take 1 tablet (375 mg total) by mouth 2 (two) times daily. 03/17/23   Henderly, Britni A, PA-C  pantoprazole (PROTONIX) 20 MG tablet Take 1 tablet (20 mg total) by mouth daily. 10/10/21   [name], MD  triamcinolone cream (KENALOG) 0.1 % Apply 1 Application topically 2 (two) times daily. Apply to rash 09/27/22   Rondel Baton, MD      Allergies    Codeine, Ibuprofen, and Imdur [isosorbide nitrate]    Review of Systems   Review of Systems  Physical Exam Updated Vital Signs BP (!) 204/82 (BP Location: Left Arm)   Pulse 86   Temp 98.3 F (36.8 C)   Resp 16   SpO2 100%  Physical Exam Vitals and nursing note reviewed.  Constitutional:      Appearance: She is well-developed.  HENT:     Head: Normocephalic and atraumatic.  Cardiovascular:     Rate and Rhythm: Normal rate and regular rhythm.  Pulmonary:     Effort: No respiratory distress.     Breath sounds: No stridor.  Abdominal:     General: There is no distension.  Musculoskeletal:     Cervical back: Normal range of motion.  Neurological:     Mental Status: She is alert.     ED Results / Procedures / Treatments   Labs (all labs ordered are listed, but only abnormal results are displayed) Labs Reviewed - No data to display  EKG None  Radiology No results found.  Procedures Procedures    Medications Ordered in ED Medications  acetaminophen (TYLENOL) tablet 650 mg (650 mg Oral Given 04/19/23 0227)  acetaminophen (TYLENOL) tablet 650 mg (650 mg Oral Given 04/18/23 2017)  amLODipine (NORVASC) tablet 10 mg (10 mg Oral Given 04/19/23 0227)    ED Course/ Medical Decision Making/ A&P                                 Medical Decision Making Risk OTC drugs. Prescription drug management.   Chronic  pain. Seen multiple times for the exact same symptoms without change. No indication for repeat workup.   Final Clinical Impression(s) / ED Diagnoses Final diagnoses:  Hypertension, unspecified type  Body aches    Rx / DC Orders ED Discharge Orders     None         Achille Xiang, Barbara Cower, MD 04/19/23 [phone]

## 2023-04-19 NOTE — ED Triage Notes (Signed)
Pt c/o generalized bodyaches

## 2023-04-20 ENCOUNTER — Encounter (HOSPITAL_COMMUNITY): Payer: Self-pay | Admitting: *Deleted

## 2023-04-20 ENCOUNTER — Emergency Department (HOSPITAL_COMMUNITY)
Admission: EM | Admit: 2023-04-20 | Discharge: 2023-04-21 | Disposition: A | Discharge status: Home / Self Care | Attending: Emergency Medicine | Admitting: Emergency Medicine

## 2023-04-20 ENCOUNTER — Other Ambulatory Visit: Payer: Self-pay

## 2023-04-20 DIAGNOSIS — M25511 Pain in right shoulder: Secondary | ICD-10-CM | POA: Diagnosis present

## 2023-04-20 DIAGNOSIS — W19XXXA Unspecified fall, initial encounter: Secondary | ICD-10-CM | POA: Insufficient documentation

## 2023-04-20 DIAGNOSIS — G8929 Other chronic pain: Secondary | ICD-10-CM

## 2023-04-20 NOTE — ED Triage Notes (Signed)
 The pt is c/o body pain from a fall [place] the city bus 2 years agi  she frequents the ed with the same complaint

## 2023-04-21 DIAGNOSIS — M25511 Pain in right shoulder: Secondary | ICD-10-CM | POA: Diagnosis not present

## 2023-04-21 MED ORDER — NAPHAZOLINE-GLYCERIN 0.012-0.25 % OP SOLN
1.0000 [drp] | Freq: Four times a day (QID) | OPHTHALMIC | Status: DC | PRN
  Administered 2023-04-21: 2 [drp] via OPHTHALMIC
  Filled 2023-04-21: qty 15

## 2023-04-21 MED ORDER — LIDOCAINE 5 % EX PTCH
1.0000 | MEDICATED_PATCH | CUTANEOUS | Status: DC
Start: 2023-04-21 — End: 2023-04-21
  Administered 2023-04-21: 1 via TRANSDERMAL
  Filled 2023-04-21: qty 1

## 2023-04-21 MED ORDER — ACETAMINOPHEN 500 MG PO TABS
1000.0000 mg | ORAL_TABLET | Freq: Once | ORAL | Status: AC
  Administered 2023-04-21: 1000 mg via ORAL
  Filled 2023-04-21: qty 2

## 2023-04-21 NOTE — ED Provider Notes (Signed)
 [place] EMERGENCY DEPARTMENT AT [facility] Provider Note   CSN: [id number] Arrival date & time: 04/20/23  7829     History  No chief complaint on file.   [name] is a 78 y.o. female.  78 yo F here with recurrent diffuse pain, mostly in right shoulder after a fall a while back. Asking for 'pain patch' and stating she needs a companion. Also has dry eyes and would like some eye drops and 'strong tylenol'. Hasn't seen an eye doctor recently.         Home Medications Prior to Admission medications   Medication Sig Start Date End Date Taking? Authorizing Provider  acetaminophen (TYLENOL) 500 MG tablet Take 2 tablets (1,000 mg total) by mouth every 6 (six) hours as needed for moderate pain (pain score 4-6). 04/10/23   Prosperi, Christian H, PA-C  albuterol (VENTOLIN HFA) 108 (90 Base) MCG/ACT inhaler Inhale 1-2 puffs into the lungs every 6 (six) hours as needed for wheezing or shortness of breath. 04/22/20   Barbette Merino, NP  amLODipine (NORVASC) 10 MG tablet Take 1 tablet (10 mg total) by mouth daily. 01/23/23   Roemhildt, Lorin T, PA-C  chlorthalidone (HYGROTON) 25 MG tablet TAKE 1 TABLET (25 MG TOTAL) BY MOUTH DAILY. 11/20/21 11/20/22  Donell Beers, FNP  diphenhydrAMINE (BENADRYL) 25 MG tablet Take 1 tablet (25 mg total) by mouth every 6 (six) hours as needed for itching or allergies. 10/03/22   Fayrene Helper, PA-C  hydrocortisone cream 1 % Apply to affected area 2 times daily 10/22/22   Garlon Hatchet, PA-C  hydrOXYzine (ATARAX) 25 MG tablet Take 1 tablet (25 mg total) by mouth every 8 (eight) hours as needed. 11/23/22   Henderly, Britni A, PA-C  lidocaine (LIDODERM) 5 % Place 1 patch onto the skin daily. Remove & Discard patch within 12 hours or as directed by MD 04/16/23   Barrett, Horald Chestnut, PA-C  loratadine (CLARITIN) 10 MG tablet Take 1 tablet (10 mg total) by mouth daily. 04/06/23   Barrett, Horald Chestnut, PA-C  metFORMIN (GLUCOPHAGE) 500 MG tablet Take 1 tablet (500  mg total) by mouth 2 (two) times daily with a meal. 11/03/22 11/03/23  Gareth Eagle, PA-C  methocarbamol (ROBAXIN) 500 MG tablet Take 1 tablet (500 mg total) by mouth 2 (two) times daily. 03/19/23   Durwin Glaze, MD  naproxen (NAPROSYN) 375 MG tablet Take 1 tablet (375 mg total) by mouth 2 (two) times daily. 03/17/23   Henderly, Britni A, PA-C  pantoprazole (PROTONIX) 20 MG tablet Take 1 tablet (20 mg total) by mouth daily. 10/10/21   [name], MD  triamcinolone cream (KENALOG) 0.1 % Apply 1 Application topically 2 (two) times daily. Apply to rash 09/27/22   Rondel Baton, MD      Allergies    Codeine, Ibuprofen, and Imdur [isosorbide nitrate]    Review of Systems   Review of Systems  Physical Exam Updated Vital Signs BP (!) 156/73 (BP Location: Right Arm)   Pulse 75   Temp 98.3 F (36.8 C) (Oral)   Resp 16   Ht 5\' 4"  (1.626 m)   Wt 49 kg   SpO2 98%   BMI 18.54 kg/m  Physical Exam Vitals and nursing note reviewed.  Constitutional:      Appearance: She is well-developed.  HENT:     Head: Normocephalic and atraumatic.  Eyes:     Extraocular Movements: Extraocular movements intact.     Conjunctiva/sclera: Conjunctivae  normal.     Pupils: Pupils are equal, round, and reactive to light.  Cardiovascular:     Rate and Rhythm: Normal rate and regular rhythm.  Pulmonary:     Effort: No respiratory distress.     Breath sounds: No stridor.  Abdominal:     General: There is no distension.  Musculoskeletal:     Cervical back: Normal range of motion.  Neurological:     General: No focal deficit present.     Mental Status: She is alert.     ED Results / Procedures / Treatments   Labs (all labs ordered are listed, but only abnormal results are displayed) Labs Reviewed - No data to display  EKG None  Radiology No results found.  Procedures Procedures    Medications Ordered in ED Medications  lidocaine (LIDODERM) 5 % 1 patch (1 patch Transdermal Patch  Applied 04/21/23 0105)  naphazoline-glycerin (CLEAR EYES REDNESS) ophth solution 1-2 drop (2 drops Both Eyes Given 04/21/23 0109)  acetaminophen (TYLENOL) tablet 1,000 mg (1,000 mg Oral Given 04/21/23 0104)    ED Course/ Medical Decision Making/ A&P                                 Medical Decision Making Risk OTC drugs. Prescription drug management.   Treated chronic conditions. No new injuries or complaints to require repeat workup.    Final Clinical Impression(s) / ED Diagnoses Final diagnoses:  None    Rx / DC Orders ED Discharge Orders     None         Janasia Coverdale, Barbara Cower, MD 04/21/23 0120

## 2023-04-21 NOTE — ED Notes (Signed)
 Pt arrived to [facility] [facility] from [employer], ambulatory with steady gait. NAD noted, A&O x4. [name] is a frequent flyer to this ED and other [facility] for multiple complaints. This is the 82th visit within the past 6 months w/o IP admission

## 2023-04-22 ENCOUNTER — Other Ambulatory Visit: Payer: Self-pay

## 2023-04-22 ENCOUNTER — Emergency Department (HOSPITAL_COMMUNITY)
Admission: EM | Admit: 2023-04-22 | Discharge: 2023-04-23 | Disposition: A | Discharge status: Home / Self Care | Attending: Emergency Medicine | Admitting: Emergency Medicine

## 2023-04-22 ENCOUNTER — Encounter (HOSPITAL_COMMUNITY): Payer: Self-pay

## 2023-04-22 DIAGNOSIS — G894 Chronic pain syndrome: Secondary | ICD-10-CM | POA: Diagnosis not present

## 2023-04-22 DIAGNOSIS — M7918 Myalgia, other site: Secondary | ICD-10-CM | POA: Insufficient documentation

## 2023-04-22 NOTE — Care Management (Addendum)
 Patient noted to be in ED waiting room, patient has had 83 ED visits w/o inpt admission. Came in today with c/o generalized pains these are similar complaints throughout.  Several APS reports have been filled and they have been screened out, stating  case does not meet criteria citing patient is not disabled. Have arranged Primary Care Appointment in past,but patient refused to f/u despaite transportation arrangements. Spoke with her recently concerning PCP f/u she stated, " I am not going anywhere! except here because I know how to get here. Updated TOC leadership and ED Director.

## 2023-04-22 NOTE — ED Triage Notes (Signed)
 Pt came in via POV d/t body hurting after falling [place] a city bus [place] long time ago. A/Ox4, rates her pain 10/10 during triage.

## 2023-04-23 NOTE — ED Provider Notes (Signed)
 [place] EMERGENCY DEPARTMENT AT [facility] Provider Note   CSN: [id number] Arrival date & time: 04/22/23  1515     History {Add pertinent medical, surgical, social history, OB history to HPI:1} Chief Complaint  Patient presents with  . Whole Body Hurts    [name] is a 78 y.o. female.  Presents to the emergency department stating that she is hurting all over.      Home Medications Prior to Admission medications   Medication Sig Start Date End Date Taking? Authorizing Provider  acetaminophen (TYLENOL) 500 MG tablet Take 2 tablets (1,000 mg total) by mouth every 6 (six) hours as needed for moderate pain (pain score 4-6). 04/10/23   Prosperi, Christian H, PA-C  albuterol (VENTOLIN HFA) 108 (90 Base) MCG/ACT inhaler Inhale 1-2 puffs into the lungs every 6 (six) hours as needed for wheezing or shortness of breath. 04/22/20   Barbette Merino, NP  amLODipine (NORVASC) 10 MG tablet Take 1 tablet (10 mg total) by mouth daily. 01/23/23   Roemhildt, Lorin T, PA-C  chlorthalidone (HYGROTON) 25 MG tablet TAKE 1 TABLET (25 MG TOTAL) BY MOUTH DAILY. 11/20/21 11/20/22  Donell Beers, FNP  diphenhydrAMINE (BENADRYL) 25 MG tablet Take 1 tablet (25 mg total) by mouth every 6 (six) hours as needed for itching or allergies. 10/03/22   Fayrene Helper, PA-C  hydrocortisone cream 1 % Apply to affected area 2 times daily 10/22/22   Garlon Hatchet, PA-C  hydrOXYzine (ATARAX) 25 MG tablet Take 1 tablet (25 mg total) by mouth every 8 (eight) hours as needed. 11/23/22   Henderly, Britni A, PA-C  lidocaine (LIDODERM) 5 % Place 1 patch onto the skin daily. Remove & Discard patch within 12 hours or as directed by MD 04/16/23   Barrett, Horald Chestnut, PA-C  loratadine (CLARITIN) 10 MG tablet Take 1 tablet (10 mg total) by mouth daily. 04/06/23   Barrett, Horald Chestnut, PA-C  metFORMIN (GLUCOPHAGE) 500 MG tablet Take 1 tablet (500 mg total) by mouth 2 (two) times daily with a meal. 11/03/22 11/03/23  Gareth Eagle, PA-C  methocarbamol (ROBAXIN) 500 MG tablet Take 1 tablet (500 mg total) by mouth 2 (two) times daily. 03/19/23   Durwin Glaze, MD  naproxen (NAPROSYN) 375 MG tablet Take 1 tablet (375 mg total) by mouth 2 (two) times daily. 03/17/23   Henderly, Britni A, PA-C  pantoprazole (PROTONIX) 20 MG tablet Take 1 tablet (20 mg total) by mouth daily. 10/10/21   [name], MD  triamcinolone cream (KENALOG) 0.1 % Apply 1 Application topically 2 (two) times daily. Apply to rash 09/27/22   Rondel Baton, MD      Allergies    Codeine, Ibuprofen, and Imdur [isosorbide nitrate]    Review of Systems   Review of Systems  Physical Exam Updated Vital Signs BP (!) 170/78 (BP Location: Right Arm)   Pulse 74   Temp 98.2 F (36.8 C) (Oral)   Resp 19   SpO2 100%  Physical Exam Vitals and nursing note reviewed.  Constitutional:      General: She is not in acute distress.    Appearance: She is well-developed.  HENT:     Head: Normocephalic and atraumatic.     Mouth/Throat:     Mouth: Mucous membranes are moist.  Eyes:     General: Vision grossly intact. Gaze aligned appropriately.     Extraocular Movements: Extraocular movements intact.     Conjunctiva/sclera: Conjunctivae normal.  Cardiovascular:  Rate and Rhythm: Normal rate and regular rhythm.     Pulses: Normal pulses.     Heart sounds: Normal heart sounds, S1 normal and S2 normal. No murmur heard.    No friction rub. No gallop.  Pulmonary:     Effort: Pulmonary effort is normal. No respiratory distress.     Breath sounds: Normal breath sounds.  Abdominal:     General: Bowel sounds are normal.     Palpations: Abdomen is soft.     Tenderness: There is no abdominal tenderness. There is no guarding or rebound.     Hernia: No hernia is present.  Musculoskeletal:        General: No swelling.     Cervical back: Full passive range of motion without pain, normal range of motion and neck supple. No spinous process tenderness or muscular  tenderness. Normal range of motion.     Right lower leg: No edema.     Left lower leg: No edema.  Skin:    General: Skin is warm and dry.     Capillary Refill: Capillary refill takes less than 2 seconds.     Findings: No ecchymosis, erythema, rash or wound.  Neurological:     General: No focal deficit present.     Mental Status: She is alert and oriented to person, place, and time.     GCS: GCS eye subscore is 4. GCS verbal subscore is 5. GCS motor subscore is 6.     Cranial Nerves: Cranial nerves 2-12 are intact.     Sensory: Sensation is intact.     Motor: Motor function is intact.     Coordination: Coordination is intact.  Psychiatric:        Attention and Perception: Attention normal.        Mood and Affect: Mood normal.        Speech: Speech normal.        Behavior: Behavior normal.    ED Results / Procedures / Treatments   Labs (all labs ordered are listed, but only abnormal results are displayed) Labs Reviewed - No data to display  EKG None  Radiology No results found.  Procedures Procedures  {Document cardiac monitor, telemetry assessment procedure when appropriate:1}  Medications Ordered in ED Medications - No data to display  ED Course/ Medical Decision Making/ A&P   {   Click here for ABCD2, HEART and other calculatorsREFRESH Note before signing :1}                              Medical Decision Making  Presents with pain all over.  Patient well-known to the emergency department with frequent visits with similar complaints.  Patient seen 2 days ago with similar complaints.  In Fact, she has been seen 29 times in March alone for similar complaints.  She appears well, no acute distress.  {Document critical care time when appropriate:1} {Document review of labs and clinical decision tools ie heart score, Chads2Vasc2 etc:1}  {Document your independent review of radiology images, and any outside records:1} {Document your discussion with family members,  caretakers, and with consultants:1} {Document social determinants of health affecting pt's care:1} {Document your decision making why or why not admission, treatments were needed:1} Final Clinical Impression(s) / ED Diagnoses Final diagnoses:  None    Rx / DC Orders ED Discharge Orders     None

## 2023-04-24 ENCOUNTER — Emergency Department (HOSPITAL_COMMUNITY)
Admission: EM | Admit: 2023-04-24 | Discharge: 2023-04-25 | Disposition: A | Discharge status: Home / Self Care | Source: Home / Self Care | Attending: Emergency Medicine | Admitting: Emergency Medicine

## 2023-04-24 ENCOUNTER — Other Ambulatory Visit: Payer: Self-pay

## 2023-04-24 ENCOUNTER — Emergency Department (HOSPITAL_COMMUNITY)
Admission: EM | Admit: 2023-04-24 | Discharge: 2023-04-24 | Disposition: A | Discharge status: Home / Self Care | Attending: Emergency Medicine | Admitting: Emergency Medicine

## 2023-04-24 DIAGNOSIS — G8929 Other chronic pain: Secondary | ICD-10-CM | POA: Diagnosis present

## 2023-04-24 DIAGNOSIS — R030 Elevated blood-pressure reading, without diagnosis of hypertension: Secondary | ICD-10-CM | POA: Diagnosis not present

## 2023-04-24 DIAGNOSIS — Z765 Malingerer [conscious simulation]: Secondary | ICD-10-CM | POA: Insufficient documentation

## 2023-04-24 NOTE — Discharge Instructions (Signed)
 Follow-up with a chronic primary care doctor.  You need to get a primary care doctor.  The emergency department is not for use because you feel like it.  It is only for use for emergencies.  Get help at the ER if you have an emergency
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
## 2023-04-24 NOTE — ED Notes (Addendum)
 Pt discharged and requested for a bus pass, a ride to the bus stop, and to be accompanied at the bus stop until bus arrived. After being informed no one is available to sit with her at the bus stop, security offered an escort to the bus stop. Once pt arrived to bus stop via security, pt then refused to get out and demanded to be taken to the bus depot. Pt comes back and checks in after being informed no one is able to take her to the bus depot stating "It's too dark outside".

## 2023-04-24 NOTE — ED Triage Notes (Signed)
 Patient reports generalized body aches today , seen here for the same complaints this evening , discharged home but did not left the waiting area .

## 2023-04-24 NOTE — ED Triage Notes (Signed)
 Patient reports generalized body aches this afternoon . No fever or chills , respirations unlabored .

## 2023-04-25 ENCOUNTER — Emergency Department (HOSPITAL_COMMUNITY)
Admission: EM | Admit: 2023-04-25 | Discharge: 2023-04-25 | Disposition: A | Discharge status: Home / Self Care | Attending: Emergency Medicine | Admitting: Emergency Medicine

## 2023-04-25 DIAGNOSIS — E119 Type 2 diabetes mellitus without complications: Secondary | ICD-10-CM | POA: Diagnosis not present

## 2023-04-25 DIAGNOSIS — M79604 Pain in right leg: Secondary | ICD-10-CM | POA: Diagnosis not present

## 2023-04-25 DIAGNOSIS — M79605 Pain in left leg: Secondary | ICD-10-CM | POA: Insufficient documentation

## 2023-04-25 DIAGNOSIS — Z79899 Other long term (current) drug therapy: Secondary | ICD-10-CM | POA: Diagnosis not present

## 2023-04-25 DIAGNOSIS — Z7984 Long term (current) use of oral hypoglycemic drugs: Secondary | ICD-10-CM | POA: Diagnosis not present

## 2023-04-25 DIAGNOSIS — M79603 Pain in arm, unspecified: Secondary | ICD-10-CM | POA: Diagnosis present

## 2023-04-25 DIAGNOSIS — R52 Pain, unspecified: Secondary | ICD-10-CM

## 2023-04-25 MED ORDER — ACETAMINOPHEN 325 MG PO TABS
650.0000 mg | ORAL_TABLET | Freq: Once | ORAL | Status: AC
  Administered 2023-04-25: 650 mg via ORAL
  Filled 2023-04-25: qty 2

## 2023-04-25 NOTE — ED Triage Notes (Signed)
 Patient continues to have generalized body aches after [place] on the city bus.

## 2023-04-25 NOTE — Discharge Instructions (Signed)
 I really encourage you to get a primary care provider.  Using the emergency room for your chronic pain needs is inappropriate.
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
## 2023-04-25 NOTE — ED Provider Notes (Signed)
 [place] EMERGENCY DEPARTMENT AT [facility] Provider Note   CSN: [id number] Arrival date & time: 04/25/23  1352     History  Chief Complaint  Patient presents with   Generalized Body Aches    [name] is a 78 y.o. female.  HPI    78 year old female comes in with chief complaint of pain.  Patient has history of arthritis, diabetes. She was seen in the ER at 4:00 this morning, return to the ER because of persistent pain. She reports pain over her arm, legs.  She just wants some medicine to help her with her pain.  She denies any fevers, chills. This is patient's third visit within 24 hours.  Home Medications Prior to Admission medications   Medication Sig Start Date End Date Taking? Authorizing Provider  acetaminophen (TYLENOL) 500 MG tablet Take 2 tablets (1,000 mg total) by mouth every 6 (six) hours as needed for moderate pain (pain score 4-6). 04/10/23   Prosperi, Christian H, PA-C  albuterol (VENTOLIN HFA) 108 (90 Base) MCG/ACT inhaler Inhale 1-2 puffs into the lungs every 6 (six) hours as needed for wheezing or shortness of breath. 04/22/20   Barbette Merino, NP  amLODipine (NORVASC) 10 MG tablet Take 1 tablet (10 mg total) by mouth daily. 01/23/23   Roemhildt, Lorin T, PA-C  chlorthalidone (HYGROTON) 25 MG tablet TAKE 1 TABLET (25 MG TOTAL) BY MOUTH DAILY. 11/20/21 11/20/22  Donell Beers, FNP  diphenhydrAMINE (BENADRYL) 25 MG tablet Take 1 tablet (25 mg total) by mouth every 6 (six) hours as needed for itching or allergies. 10/03/22   Fayrene Helper, PA-C  hydrocortisone cream 1 % Apply to affected area 2 times daily 10/22/22   Garlon Hatchet, PA-C  hydrOXYzine (ATARAX) 25 MG tablet Take 1 tablet (25 mg total) by mouth every 8 (eight) hours as needed. 11/23/22   Henderly, Britni A, PA-C  lidocaine (LIDODERM) 5 % Place 1 patch onto the skin daily. Remove & Discard patch within 12 hours or as directed by MD 04/16/23   Barrett, Horald Chestnut, PA-C  loratadine (CLARITIN) 10  MG tablet Take 1 tablet (10 mg total) by mouth daily. 04/06/23   Barrett, Horald Chestnut, PA-C  metFORMIN (GLUCOPHAGE) 500 MG tablet Take 1 tablet (500 mg total) by mouth 2 (two) times daily with a meal. 11/03/22 11/03/23  Gareth Eagle, PA-C  methocarbamol (ROBAXIN) 500 MG tablet Take 1 tablet (500 mg total) by mouth 2 (two) times daily. 03/19/23   Durwin Glaze, MD  naproxen (NAPROSYN) 375 MG tablet Take 1 tablet (375 mg total) by mouth 2 (two) times daily. 03/17/23   Henderly, Britni A, PA-C  pantoprazole (PROTONIX) 20 MG tablet Take 1 tablet (20 mg total) by mouth daily. 10/10/21   [name], MD  triamcinolone cream (KENALOG) 0.1 % Apply 1 Application topically 2 (two) times daily. Apply to rash 09/27/22   Rondel Baton, MD      Allergies    Codeine, Ibuprofen, and Imdur [isosorbide nitrate]    Review of Systems   Review of Systems  Physical Exam Updated Vital Signs BP 121/61 (BP Location: Left Arm)   Pulse 85   Temp 98.2 F (36.8 C)   Resp 16   SpO2 100%  Physical Exam Vitals and nursing note reviewed.  Constitutional:      Appearance: She is well-developed.  HENT:     Head: Atraumatic.  Cardiovascular:     Rate and Rhythm: Normal rate.  Pulmonary:  Effort: Pulmonary effort is normal.  Musculoskeletal:        General: No deformity.     Cervical back: Neck supple.     Comments: Patient moving her upper and lower extremity.  She has tenderness with palpation over her shoulder, upper extremity bilaterally  Skin:    General: Skin is warm.  Neurological:     Mental Status: She is alert and oriented to person, place, and time.     ED Results / Procedures / Treatments   Labs (all labs ordered are listed, but only abnormal results are displayed) Labs Reviewed - No data to display  EKG None  Radiology No results found.  Procedures Procedures    Medications Ordered in ED Medications  acetaminophen (TYLENOL) tablet 650 mg (has no administration in time range)     ED Course/ Medical Decision Making/ A&P                                 Medical Decision Making 78 year old female with history of diabetes, osteoarthritis, frequent ER visits because of pain comes in with chief complaint of generalized pain.  She denies any URI-like symptoms, fevers, chills.  Third visit to the ER in 24 hours.  Patient noted to have 0 SIRS criteria.  She is moving both upper and lower extremities, but has diffuse tenderness upon palpation.  Based on my assessment, I do not think patient has any acute emergent condition.  No traumatic event preceding this visit.  She has been seen in the ER 3 times in the last 24 hours.  I do not think any lab workup is indicated.  Clinical suspicion for conditions like rhabdo myelosis, clinically significant infection is low.  Risk OTC drugs.    Final Clinical Impression(s) / ED Diagnoses Final diagnoses:  Diffuse pain    Rx / DC Orders ED Discharge Orders     None         Derwood Kaplan, MD 04/25/23 1708

## 2023-04-26 ENCOUNTER — Other Ambulatory Visit: Payer: Self-pay

## 2023-04-26 ENCOUNTER — Encounter (HOSPITAL_COMMUNITY): Payer: Self-pay

## 2023-04-26 ENCOUNTER — Emergency Department (HOSPITAL_COMMUNITY)
Admission: EM | Admit: 2023-04-26 | Discharge: 2023-04-27 | Disposition: A | Discharge status: Home / Self Care | Attending: Emergency Medicine | Admitting: Emergency Medicine

## 2023-04-26 DIAGNOSIS — Z8673 Personal history of transient ischemic attack (TIA), and cerebral infarction without residual deficits: Secondary | ICD-10-CM | POA: Insufficient documentation

## 2023-04-26 DIAGNOSIS — Z7984 Long term (current) use of oral hypoglycemic drugs: Secondary | ICD-10-CM | POA: Diagnosis not present

## 2023-04-26 DIAGNOSIS — I10 Essential (primary) hypertension: Secondary | ICD-10-CM | POA: Diagnosis not present

## 2023-04-26 DIAGNOSIS — M791 Myalgia, unspecified site: Secondary | ICD-10-CM | POA: Insufficient documentation

## 2023-04-26 DIAGNOSIS — Z5900 Homelessness unspecified: Secondary | ICD-10-CM | POA: Insufficient documentation

## 2023-04-26 DIAGNOSIS — Z79899 Other long term (current) drug therapy: Secondary | ICD-10-CM | POA: Diagnosis not present

## 2023-04-26 DIAGNOSIS — E119 Type 2 diabetes mellitus without complications: Secondary | ICD-10-CM | POA: Insufficient documentation

## 2023-04-26 DIAGNOSIS — G8929 Other chronic pain: Secondary | ICD-10-CM | POA: Diagnosis present

## 2023-04-26 MED ORDER — ACETAMINOPHEN 325 MG PO TABS
650.0000 mg | ORAL_TABLET | Freq: Once | ORAL | Status: AC
  Administered 2023-04-27: 650 mg via ORAL
  Filled 2023-04-26: qty 2

## 2023-04-26 NOTE — ED Triage Notes (Signed)
 Patient reports body aches since [place] "maybe two weeks ago". Patient also requests new soft neck collar because her current one does not offer as much support.

## 2023-04-26 NOTE — Discharge Instructions (Addendum)
 You can take Tylenol every 6-8 hours as needed for your body aches. Your blood pressure is elevated today so advise following up with your primary care provider.  Follow-up with your primary care for reevaluation.  If you do not have one, I have attached information to the [facility] with.  Get help right away if: You are not able to control when you pee or poop. You have bad back pain and: You feel like you may vomit (nauseous). You vomit. You have pain in your chest or your belly (abdomen). You have shortness of breath. You faint.
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
## 2023-04-27 ENCOUNTER — Encounter (HOSPITAL_COMMUNITY): Payer: Self-pay | Admitting: *Deleted

## 2023-04-27 ENCOUNTER — Emergency Department (HOSPITAL_COMMUNITY)
Admission: EM | Admit: 2023-04-27 | Discharge: 2023-04-27 | Disposition: A | Discharge status: Home / Self Care | Attending: Emergency Medicine | Admitting: Emergency Medicine

## 2023-04-27 ENCOUNTER — Other Ambulatory Visit: Payer: Self-pay

## 2023-04-27 DIAGNOSIS — Z79899 Other long term (current) drug therapy: Secondary | ICD-10-CM | POA: Insufficient documentation

## 2023-04-27 DIAGNOSIS — M791 Myalgia, unspecified site: Secondary | ICD-10-CM | POA: Diagnosis not present

## 2023-04-27 DIAGNOSIS — I10 Essential (primary) hypertension: Secondary | ICD-10-CM | POA: Insufficient documentation

## 2023-04-27 DIAGNOSIS — G8929 Other chronic pain: Secondary | ICD-10-CM | POA: Insufficient documentation

## 2023-04-27 DIAGNOSIS — Z7984 Long term (current) use of oral hypoglycemic drugs: Secondary | ICD-10-CM | POA: Insufficient documentation

## 2023-04-27 DIAGNOSIS — E119 Type 2 diabetes mellitus without complications: Secondary | ICD-10-CM | POA: Insufficient documentation

## 2023-04-27 MED ORDER — ACETAMINOPHEN 325 MG PO TABS
650.0000 mg | ORAL_TABLET | Freq: Once | ORAL | Status: AC
  Administered 2023-04-27: 650 mg via ORAL
  Filled 2023-04-27: qty 2

## 2023-04-27 NOTE — ED Triage Notes (Signed)
 Pt is here every day and checks in numerus times in a 24 hour period c/o body pain

## 2023-04-27 NOTE — Discharge Instructions (Signed)
 Return for any problem.  ?
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
## 2023-04-29 ENCOUNTER — Emergency Department (HOSPITAL_COMMUNITY)
Admission: EM | Admit: 2023-04-29 | Discharge: 2023-04-30 | Disposition: A | Discharge status: Home / Self Care | Attending: Emergency Medicine | Admitting: Emergency Medicine

## 2023-04-29 ENCOUNTER — Other Ambulatory Visit: Payer: Self-pay

## 2023-04-29 ENCOUNTER — Encounter (HOSPITAL_COMMUNITY): Payer: Self-pay

## 2023-04-29 DIAGNOSIS — G8929 Other chronic pain: Secondary | ICD-10-CM

## 2023-04-29 DIAGNOSIS — J029 Acute pharyngitis, unspecified: Secondary | ICD-10-CM | POA: Diagnosis present

## 2023-04-29 DIAGNOSIS — M791 Myalgia, unspecified site: Secondary | ICD-10-CM | POA: Diagnosis present

## 2023-04-29 DIAGNOSIS — I10 Essential (primary) hypertension: Secondary | ICD-10-CM | POA: Insufficient documentation

## 2023-04-29 DIAGNOSIS — E119 Type 2 diabetes mellitus without complications: Secondary | ICD-10-CM | POA: Diagnosis not present

## 2023-04-29 DIAGNOSIS — Z8673 Personal history of transient ischemic attack (TIA), and cerebral infarction without residual deficits: Secondary | ICD-10-CM | POA: Insufficient documentation

## 2023-04-29 DIAGNOSIS — U071 COVID-19: Secondary | ICD-10-CM | POA: Diagnosis not present
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
## 2023-04-30 ENCOUNTER — Emergency Department (HOSPITAL_COMMUNITY)
Admission: EM | Admit: 2023-04-30 | Discharge: 2023-05-01 | Disposition: A | Discharge status: Home / Self Care | Attending: Emergency Medicine | Admitting: Emergency Medicine

## 2023-04-30 ENCOUNTER — Other Ambulatory Visit: Payer: Self-pay

## 2023-04-30 ENCOUNTER — Encounter (HOSPITAL_COMMUNITY): Payer: Self-pay | Admitting: *Deleted

## 2023-04-30 ENCOUNTER — Emergency Department (HOSPITAL_COMMUNITY)
Admission: EM | Admit: 2023-04-30 | Discharge: 2023-04-30 | Disposition: A | Discharge status: Home / Self Care | Source: Home / Self Care | Attending: Emergency Medicine | Admitting: Emergency Medicine

## 2023-04-30 DIAGNOSIS — U071 COVID-19: Secondary | ICD-10-CM | POA: Insufficient documentation

## 2023-04-30 DIAGNOSIS — J029 Acute pharyngitis, unspecified: Secondary | ICD-10-CM

## 2023-04-30 DIAGNOSIS — M791 Myalgia, unspecified site: Secondary | ICD-10-CM | POA: Diagnosis not present

## 2023-04-30 LAB — GROUP A STREP BY PCR: Group A Strep by PCR: NOT DETECTED

## 2023-04-30 LAB — RESP PANEL BY RT-PCR (RSV, FLU A&B, COVID)  RVPGX2
Influenza A by PCR: NEGATIVE
Influenza B by PCR: NEGATIVE
Resp Syncytial Virus by PCR: NEGATIVE
SARS Coronavirus 2 by RT PCR: POSITIVE — AB

## 2023-04-30 MED ORDER — GABAPENTIN 100 MG PO CAPS: 100.0000 mg | ORAL_CAPSULE | Freq: Three times a day (TID) | ORAL | 1 refills | Status: DC | PRN

## 2023-04-30 MED ORDER — ACETAMINOPHEN 500 MG PO TABS
1000.0000 mg | ORAL_TABLET | Freq: Once | ORAL | Status: AC
  Administered 2023-04-30: 1000 mg via ORAL
  Filled 2023-04-30: qty 2

## 2023-04-30 NOTE — ED Provider Notes (Signed)
 [place] EMERGENCY DEPARTMENT AT [facility] Provider Note   CSN: [id number] Arrival date & time: 04/30/23  1415     History  Chief Complaint  Patient presents with   Sore Throat    [name] is a 78 y.o. female.  78 year old female well-known to emergency room here today for sore throat.  Patient diagnosed with COVID recently.   Sore Throat       Home Medications Prior to Admission medications   Medication Sig Start Date End Date Taking? Authorizing Provider  acetaminophen (TYLENOL) 500 MG tablet Take 2 tablets (1,000 mg total) by mouth every 6 (six) hours as needed for moderate pain (pain score 4-6). 04/10/23   Prosperi, Christian H, PA-C  albuterol (VENTOLIN HFA) 108 (90 Base) MCG/ACT inhaler Inhale 1-2 puffs into the lungs every 6 (six) hours as needed for wheezing or shortness of breath. 04/22/20   Barbette Merino, NP  amLODipine (NORVASC) 10 MG tablet Take 1 tablet (10 mg total) by mouth daily. 01/23/23   Roemhildt, Lorin T, PA-C  chlorthalidone (HYGROTON) 25 MG tablet TAKE 1 TABLET (25 MG TOTAL) BY MOUTH DAILY. 11/20/21 11/20/22  Donell Beers, FNP  diphenhydrAMINE (BENADRYL) 25 MG tablet Take 1 tablet (25 mg total) by mouth every 6 (six) hours as needed for itching or allergies. 10/03/22   Fayrene Helper, PA-C  gabapentin (NEURONTIN) 100 MG capsule Take 1 capsule (100 mg total) by mouth 3 (three) times daily as needed. 04/30/23   Sabas Sous, MD  hydrocortisone cream 1 % Apply to affected area 2 times daily 10/22/22   Garlon Hatchet, PA-C  hydrOXYzine (ATARAX) 25 MG tablet Take 1 tablet (25 mg total) by mouth every 8 (eight) hours as needed. 11/23/22   Henderly, Britni A, PA-C  lidocaine (LIDODERM) 5 % Place 1 patch onto the skin daily. Remove & Discard patch within 12 hours or as directed by MD 04/16/23   Barrett, Horald Chestnut, PA-C  loratadine (CLARITIN) 10 MG tablet Take 1 tablet (10 mg total) by mouth daily. 04/06/23   Barrett, Horald Chestnut, PA-C  metFORMIN  (GLUCOPHAGE) 500 MG tablet Take 1 tablet (500 mg total) by mouth 2 (two) times daily with a meal. 11/03/22 11/03/23  Gareth Eagle, PA-C  methocarbamol (ROBAXIN) 500 MG tablet Take 1 tablet (500 mg total) by mouth 2 (two) times daily. 03/19/23   Durwin Glaze, MD  naproxen (NAPROSYN) 375 MG tablet Take 1 tablet (375 mg total) by mouth 2 (two) times daily. 03/17/23   Henderly, Britni A, PA-C  pantoprazole (PROTONIX) 20 MG tablet Take 1 tablet (20 mg total) by mouth daily. 10/10/21   [name], MD  triamcinolone cream (KENALOG) 0.1 % Apply 1 Application topically 2 (two) times daily. Apply to rash 09/27/22   Rondel Baton, MD      Allergies    Codeine, Ibuprofen, and Imdur [isosorbide nitrate]    Review of Systems   Review of Systems  Physical Exam Updated Vital Signs BP 113/73 (BP Location: Right Arm)   Pulse 86   Temp 98.6 F (37 C) (Oral)   Resp 16   SpO2 98%  Physical Exam Vitals reviewed.  HENT:     Nose: No congestion or rhinorrhea.     Mouth/Throat:     Mouth: No oral lesions.     Pharynx: No oropharyngeal exudate, posterior oropharyngeal erythema or uvula swelling.  Neurological:     Mental Status: She is alert.     ED  Results / Procedures / Treatments   Labs (all labs ordered are listed, but only abnormal results are displayed) Labs Reviewed  RESP PANEL BY RT-PCR (RSV, FLU A&B, COVID)  RVPGX2 - Abnormal; Notable for the following components:      Result Value   SARS Coronavirus 2 by RT PCR POSITIVE (*)    All other components within normal limits  GROUP A STREP BY PCR    EKG None  Radiology No results found.  Procedures Procedures    Medications Ordered in ED Medications  acetaminophen (TYLENOL) tablet 1,000 mg (has no administration in time range)    ED Course/ Medical Decision Making/ A&P                                 Medical Decision Making 78 year old female here today for sore throat.  Plan-patient tested positive for COVID.   Low suspicion for RPA, PTA based on exam.  Overall looks well.  Afebrile.  Ordered Tylenol.  Tolerating p.o.  Discharge.           Final Clinical Impression(s) / ED Diagnoses Final diagnoses:  Viral pharyngitis    Rx / DC Orders ED Discharge Orders     None         Arletha Pili, DO 04/30/23 2119

## 2023-04-30 NOTE — Discharge Instructions (Addendum)
 You were evaluated in the Emergency Department and after careful evaluation, we did not find any emergent condition requiring admission or further testing in the hospital.  Your exam/testing today is overall reassuring.  Recommend trying the gabapentin for pain 3 times daily as needed, recommend follow-up with your primary care doctor or a pain management expert.  Please return to the Emergency Department if you experience any worsening of your condition.   Thank you for allowing [place] to be a part of your care.
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
## 2023-04-30 NOTE — ED Triage Notes (Signed)
 Pt recently left after being diagnosed with Covid, initially denied symptoms however reports sore throat. Requesting medications for Covid

## 2023-04-30 NOTE — ED Triage Notes (Signed)
 Pt here for sore throat x3-4 days. Pt states that she had a dry throat feeling at first and now pain.

## 2023-05-01 MED ORDER — ACETAMINOPHEN 500 MG PO TABS
500.0000 mg | ORAL_TABLET | Freq: Four times a day (QID) | ORAL | 0 refills | Status: DC | PRN
Start: 2023-05-01 — End: 2023-05-31

## 2023-05-01 NOTE — Discharge Instructions (Addendum)
 Today you were seen for a COVID infection.  You may alternate taking Tylenol as needed for fever and pain, Flonase as needed for nasal congestion, and plain Mucinex as needed for chest congestion.  Thank you for letting us treat you today. After reviewing your labs and imaging, I feel you are safe to go home. Please follow up with your PCP in the next several days and provide them with your records from this visit. Return to the Emergency Room if pain becomes severe or symptoms worsen.

## 2023-05-01 NOTE — ED Provider Notes (Signed)
 [place] EMERGENCY DEPARTMENT AT [facility] Provider Note   CSN: [id number] Arrival date & time: 04/30/23  2140     History  No chief complaint on file.   [name] is a 78 y.o. female recently diagnosed with COVID presents today requesting medication for COVID.  Patient endorses sore throat.  Patient denies fever, chills, body aches, nausea, vomiting, chest pain, or shortness of breath.  HPI     Home Medications Prior to Admission medications   Medication Sig Start Date End Date Taking? Authorizing Provider  acetaminophen (TYLENOL) 500 MG tablet Take 1 tablet (500 mg total) by mouth every 6 (six) hours as needed. 05/01/23  Yes Dolphus Jenny, PA-C  albuterol (VENTOLIN HFA) 108 (90 Base) MCG/ACT inhaler Inhale 1-2 puffs into the lungs every 6 (six) hours as needed for wheezing or shortness of breath. 04/22/20   Barbette Merino, NP  amLODipine (NORVASC) 10 MG tablet Take 1 tablet (10 mg total) by mouth daily. 01/23/23   Roemhildt, Lorin T, PA-C  chlorthalidone (HYGROTON) 25 MG tablet TAKE 1 TABLET (25 MG TOTAL) BY MOUTH DAILY. 11/20/21 11/20/22  Donell Beers, FNP  diphenhydrAMINE (BENADRYL) 25 MG tablet Take 1 tablet (25 mg total) by mouth every 6 (six) hours as needed for itching or allergies. 10/03/22   Fayrene Helper, PA-C  gabapentin (NEURONTIN) 100 MG capsule Take 1 capsule (100 mg total) by mouth 3 (three) times daily as needed. 04/30/23   Sabas Sous, MD  hydrocortisone cream 1 % Apply to affected area 2 times daily 10/22/22   Garlon Hatchet, PA-C  hydrOXYzine (ATARAX) 25 MG tablet Take 1 tablet (25 mg total) by mouth every 8 (eight) hours as needed. 11/23/22   Henderly, Britni A, PA-C  lidocaine (LIDODERM) 5 % Place 1 patch onto the skin daily. Remove & Discard patch within 12 hours or as directed by MD 04/16/23   Barrett, Horald Chestnut, PA-C  loratadine (CLARITIN) 10 MG tablet Take 1 tablet (10 mg total) by mouth daily. 04/06/23   Barrett, Horald Chestnut, PA-C  metFORMIN  (GLUCOPHAGE) 500 MG tablet Take 1 tablet (500 mg total) by mouth 2 (two) times daily with a meal. 11/03/22 11/03/23  Gareth Eagle, PA-C  methocarbamol (ROBAXIN) 500 MG tablet Take 1 tablet (500 mg total) by mouth 2 (two) times daily. 03/19/23   Durwin Glaze, MD  naproxen (NAPROSYN) 375 MG tablet Take 1 tablet (375 mg total) by mouth 2 (two) times daily. 03/17/23   Henderly, Britni A, PA-C  pantoprazole (PROTONIX) 20 MG tablet Take 1 tablet (20 mg total) by mouth daily. 10/10/21   [name], MD  triamcinolone cream (KENALOG) 0.1 % Apply 1 Application topically 2 (two) times daily. Apply to rash 09/27/22   Rondel Baton, MD      Allergies    Codeine, Ibuprofen, and Imdur [isosorbide nitrate]    Review of Systems   Review of Systems  HENT:  Positive for sore throat.     Physical Exam Updated Vital Signs There were no vitals taken for this visit. Physical Exam Vitals and nursing note reviewed.  Constitutional:      General: She is not in acute distress.    Appearance: Normal appearance. She is well-developed. She is not ill-appearing, toxic-appearing or diaphoretic.  HENT:     Head: Normocephalic and atraumatic.     Right Ear: External ear normal.     Left Ear: External ear normal.     Nose: Nose  normal. No congestion or rhinorrhea.     Mouth/Throat:     Mouth: Mucous membranes are moist.     Pharynx: Oropharynx is clear.  Eyes:     Conjunctiva/sclera: Conjunctivae normal.  Cardiovascular:     Rate and Rhythm: Normal rate and regular rhythm.     Pulses: Normal pulses.     Heart sounds: No murmur heard. Pulmonary:     Effort: Pulmonary effort is normal. No respiratory distress.     Breath sounds: Normal breath sounds.  Abdominal:     Palpations: Abdomen is soft.     Tenderness: There is no abdominal tenderness.  Musculoskeletal:        General: No swelling.     Cervical back: Normal range of motion and neck supple.  Skin:    General: Skin is warm and dry.      Capillary Refill: Capillary refill takes less than 2 seconds.  Neurological:     Mental Status: She is alert.  Psychiatric:        Mood and Affect: Mood normal.     ED Results / Procedures / Treatments   Labs (all labs ordered are listed, but only abnormal results are displayed) Labs Reviewed - No data to display  EKG None  Radiology No results found.  Procedures Procedures    Medications Ordered in ED Medications - No data to display  ED Course/ Medical Decision Making/ A&P                                 Medical Decision Making  This patient presents to the ED for concern of sore throat differential diagnosis includes COVID, flu, RSV    Additional history obtained:  External records from outside source obtained and reviewed including recent ER visits     Problem List / ED Course:  Discussed with patient that she would have to reduce her amlodipine dose in order to be on Paxlovid.  Patient stated that she did not know which 1 was her amlodipine and that she would not be able to do that.  Discussed with patient that because these medications interact if she does not know which medication is her amlodipine, that I could not prescribe her Paxlovid.  Patient expressed understanding.  Consider formation further workup however patient's vital signs and physical exam are reassuring.  Expressed to patient that they should take Tylenol as needed for pain.  Patient prescribed patient Tylenol to pharmacy as requested by patient.  Patient should also do salt water gargles.        Final Clinical Impression(s) / ED Diagnoses Final diagnoses:  COVID    Rx / DC Orders ED Discharge Orders          Ordered    acetaminophen (TYLENOL) 500 MG tablet  Every 6 hours PRN        05/01/23 0834              Dolphus Jenny, PA-C 05/01/23 9811    Rolan Bucco, MD 05/01/23 1427

## 2023-05-01 NOTE — ED Notes (Signed)
 Pt. Told to sit in precaution area in lobby multiple times due to being Covid+ but keeps moving back to the regular waiting area

## 2023-05-02 ENCOUNTER — Encounter (HOSPITAL_COMMUNITY): Payer: Self-pay | Admitting: Emergency Medicine

## 2023-05-02 ENCOUNTER — Emergency Department (HOSPITAL_COMMUNITY)
Admission: EM | Admit: 2023-05-02 | Discharge: 2023-05-03 | Disposition: A | Discharge status: Home / Self Care | Source: Home / Self Care

## 2023-05-02 ENCOUNTER — Other Ambulatory Visit: Payer: Self-pay

## 2023-05-02 ENCOUNTER — Emergency Department (HOSPITAL_COMMUNITY)
Admission: EM | Admit: 2023-05-02 | Discharge: 2023-05-02 | Disposition: A | Discharge status: Home / Self Care | Attending: Emergency Medicine | Admitting: Emergency Medicine

## 2023-05-02 DIAGNOSIS — B349 Viral infection, unspecified: Secondary | ICD-10-CM | POA: Insufficient documentation

## 2023-05-02 DIAGNOSIS — Z7984 Long term (current) use of oral hypoglycemic drugs: Secondary | ICD-10-CM | POA: Insufficient documentation

## 2023-05-02 DIAGNOSIS — Z79899 Other long term (current) drug therapy: Secondary | ICD-10-CM | POA: Insufficient documentation

## 2023-05-02 DIAGNOSIS — M542 Cervicalgia: Secondary | ICD-10-CM | POA: Insufficient documentation

## 2023-05-02 DIAGNOSIS — I10 Essential (primary) hypertension: Secondary | ICD-10-CM | POA: Insufficient documentation

## 2023-05-02 DIAGNOSIS — W19XXXA Unspecified fall, initial encounter: Secondary | ICD-10-CM | POA: Insufficient documentation

## 2023-05-02 DIAGNOSIS — M791 Myalgia, unspecified site: Secondary | ICD-10-CM | POA: Diagnosis present

## 2023-05-02 DIAGNOSIS — Y92811 Bus as the place of occurrence of the external cause: Secondary | ICD-10-CM | POA: Insufficient documentation

## 2023-05-02 DIAGNOSIS — R52 Pain, unspecified: Secondary | ICD-10-CM

## 2023-05-02 DIAGNOSIS — E119 Type 2 diabetes mellitus without complications: Secondary | ICD-10-CM | POA: Insufficient documentation

## 2023-05-02 MED ORDER — ACETAMINOPHEN 500 MG PO TABS
1000.0000 mg | ORAL_TABLET | Freq: Once | ORAL | Status: AC
  Administered 2023-05-02: 1000 mg via ORAL
  Filled 2023-05-02: qty 2

## 2023-05-02 NOTE — ED Triage Notes (Signed)
 Patient reports generalized body aches today , denies injury/ambulatory . Respirations unlabored.

## 2023-05-02 NOTE — ED Triage Notes (Signed)
 Pt reports falling 2 weeks ago on the bus and having neck pain. States the pain is making her whole body hurt. Also wanting meds for a cough.

## 2023-05-02 NOTE — Discharge Instructions (Signed)
 As discussed, your evaluation today has been largely reassuring.  But, it is important that you monitor your condition carefully, and do not hesitate to return to the ED if you develop new, or concerning changes in your condition. ? ?Otherwise, please follow-up with your physician for appropriate ongoing care. ? ?

## 2023-05-02 NOTE — ED Provider Notes (Signed)
 [place] EMERGENCY DEPARTMENT AT [facility] Provider Note   CSN: [id number] Arrival date & time: 05/02/23  1345     History  Chief Complaint  Patient presents with   Neck Pain    [name] is a 78 y.o. female.  HPI Patient presents for second time in 24 hours, 90th time in 3 months, with soreness.  No new pain, no change from yesterday, she requests Tylenol    Home Medications Prior to Admission medications   Medication Sig Start Date End Date Taking? Authorizing Provider  acetaminophen (TYLENOL) 500 MG tablet Take 1 tablet (500 mg total) by mouth every 6 (six) hours as needed. 05/01/23   Dolphus Jenny, PA-C  albuterol (VENTOLIN HFA) 108 (90 Base) MCG/ACT inhaler Inhale 1-2 puffs into the lungs every 6 (six) hours as needed for wheezing or shortness of breath. 04/22/20   Barbette Merino, NP  amLODipine (NORVASC) 10 MG tablet Take 1 tablet (10 mg total) by mouth daily. 01/23/23   Roemhildt, Lorin T, PA-C  chlorthalidone (HYGROTON) 25 MG tablet TAKE 1 TABLET (25 MG TOTAL) BY MOUTH DAILY. 11/20/21 11/20/22  Donell Beers, FNP  diphenhydrAMINE (BENADRYL) 25 MG tablet Take 1 tablet (25 mg total) by mouth every 6 (six) hours as needed for itching or allergies. 10/03/22   Fayrene Helper, PA-C  gabapentin (NEURONTIN) 100 MG capsule Take 1 capsule (100 mg total) by mouth 3 (three) times daily as needed. 04/30/23   Sabas Sous, MD  hydrocortisone cream 1 % Apply to affected area 2 times daily 10/22/22   Garlon Hatchet, PA-C  hydrOXYzine (ATARAX) 25 MG tablet Take 1 tablet (25 mg total) by mouth every 8 (eight) hours as needed. 11/23/22   Henderly, Britni A, PA-C  lidocaine (LIDODERM) 5 % Place 1 patch onto the skin daily. Remove & Discard patch within 12 hours or as directed by MD 04/16/23   Barrett, Horald Chestnut, PA-C  loratadine (CLARITIN) 10 MG tablet Take 1 tablet (10 mg total) by mouth daily. 04/06/23   Barrett, Horald Chestnut, PA-C  metFORMIN (GLUCOPHAGE) 500 MG tablet Take 1 tablet  (500 mg total) by mouth 2 (two) times daily with a meal. 11/03/22 11/03/23  Gareth Eagle, PA-C  methocarbamol (ROBAXIN) 500 MG tablet Take 1 tablet (500 mg total) by mouth 2 (two) times daily. 03/19/23   Durwin Glaze, MD  naproxen (NAPROSYN) 375 MG tablet Take 1 tablet (375 mg total) by mouth 2 (two) times daily. 03/17/23   Henderly, Britni A, PA-C  pantoprazole (PROTONIX) 20 MG tablet Take 1 tablet (20 mg total) by mouth daily. 10/10/21   [name], MD  triamcinolone cream (KENALOG) 0.1 % Apply 1 Application topically 2 (two) times daily. Apply to rash 09/27/22   Rondel Baton, MD      Allergies    Codeine, Ibuprofen, and Imdur [isosorbide nitrate]    Review of Systems   Review of Systems  Physical Exam Updated Vital Signs BP (!) 147/55   Pulse 78   Temp 98.4 F (36.9 C) (Oral)   Resp 18   SpO2 98%  Physical Exam Vitals and nursing note reviewed.  Constitutional:      General: She is not in acute distress.    Appearance: She is well-developed.  HENT:     Head: Normocephalic and atraumatic.  Eyes:     Conjunctiva/sclera: Conjunctivae normal.  Neck:     Comments: Soft cervical collar in place Pulmonary:  Effort: Pulmonary effort is normal. No respiratory distress.     Breath sounds: No stridor.  Abdominal:     General: There is no distension.  Skin:    General: Skin is warm and dry.  Neurological:     Mental Status: She is alert and oriented to person, place, and time.     Cranial Nerves: No cranial nerve deficit.  Psychiatric:        Mood and Affect: Mood normal.     ED Results / Procedures / Treatments   Labs (all labs ordered are listed, but only abnormal results are displayed) Labs Reviewed - No data to display  EKG None  Radiology No results found.  Procedures Procedures    Medications Ordered in ED Medications  acetaminophen (TYLENOL) tablet 1,000 mg (has no administration in time range)    ED Course/ Medical Decision Making/ A&P                                  Medical Decision Making Adult female with frequent ED visits for pain presents with pain.  She is awake, alert, providing her history, denies any changes from baseline, is noted to have ongoing pain issues, but without any new complaints, without any new hemodynamic instability, without evidence of trauma, fall, other concerns, patient discharged to follow-up with primary care.  Amount and/or Complexity of Data Reviewed External Data Reviewed: notes.  Risk OTC drugs. Diagnosis or treatment significantly limited by social determinants of health.  Final Clinical Impression(s) / ED Diagnoses Final diagnoses:  Pain    Rx / DC Orders ED Discharge Orders     None         Gerhard Munch, MD 05/02/23 [phone]

## 2023-05-03 MED ORDER — ACETAMINOPHEN 500 MG PO TABS
1000.0000 mg | ORAL_TABLET | Freq: Once | ORAL | Status: AC
  Administered 2023-05-03: 1000 mg via ORAL
  Filled 2023-05-03: qty 2

## 2023-05-03 NOTE — Discharge Instructions (Signed)
 Your body aches is likely due to your COVID infection.  Take Tylenol at home.  You can get this medication over-the-counter.  Get plenty of rest stay hydrated and follow-up with your doctor as needed.

## 2023-05-03 NOTE — ED Provider Notes (Signed)
 [place] EMERGENCY DEPARTMENT AT [facility] Provider Note   CSN: [id number] Arrival date & time: 05/02/23  2157     History  Chief Complaint  Patient presents with   Body Aches    [name] is a 78 y.o. female.  The history is provided by the patient and medical records. No language interpreter was used.     78 year old female history of cognitive impairment, prior stroke, diabetes, hypertension, GERD, frequent ER visits presenting with complaints of bodyaches. Please note this is patient's third visit within the past 24 hours, 91st times in the past 3 months.  Patient states she is hurting everywhere.  She attributed to a fall that she had quite a while back.  She also complaining of some sore throat.  She mention she does not have Tylenol at home.  She also states she is having trouble seeing at night and she was evaluated in the ER yesterday for the same complaint, was discharged but cannot go home until daylight and decided to check back in. No fever chills nausea vomiting diarrhea   Home Medications Prior to Admission medications   Medication Sig Start Date End Date Taking? Authorizing Provider  acetaminophen (TYLENOL) 500 MG tablet Take 1 tablet (500 mg total) by mouth every 6 (six) hours as needed. 05/01/23   Dolphus Jenny, PA-C  albuterol (VENTOLIN HFA) 108 (90 Base) MCG/ACT inhaler Inhale 1-2 puffs into the lungs every 6 (six) hours as needed for wheezing or shortness of breath. 04/22/20   Barbette Merino, NP  amLODipine (NORVASC) 10 MG tablet Take 1 tablet (10 mg total) by mouth daily. 01/23/23   Roemhildt, Lorin T, PA-C  chlorthalidone (HYGROTON) 25 MG tablet TAKE 1 TABLET (25 MG TOTAL) BY MOUTH DAILY. 11/20/21 11/20/22  Donell Beers, FNP  diphenhydrAMINE (BENADRYL) 25 MG tablet Take 1 tablet (25 mg total) by mouth every 6 (six) hours as needed for itching or allergies. 10/03/22   Fayrene Helper, PA-C  gabapentin (NEURONTIN) 100 MG capsule Take 1 capsule (100  mg total) by mouth 3 (three) times daily as needed. 04/30/23   Sabas Sous, MD  hydrocortisone cream 1 % Apply to affected area 2 times daily 10/22/22   Garlon Hatchet, PA-C  hydrOXYzine (ATARAX) 25 MG tablet Take 1 tablet (25 mg total) by mouth every 8 (eight) hours as needed. 11/23/22   Henderly, Britni A, PA-C  lidocaine (LIDODERM) 5 % Place 1 patch onto the skin daily. Remove & Discard patch within 12 hours or as directed by MD 04/16/23   Barrett, Horald Chestnut, PA-C  loratadine (CLARITIN) 10 MG tablet Take 1 tablet (10 mg total) by mouth daily. 04/06/23   Barrett, Horald Chestnut, PA-C  metFORMIN (GLUCOPHAGE) 500 MG tablet Take 1 tablet (500 mg total) by mouth 2 (two) times daily with a meal. 11/03/22 11/03/23  Gareth Eagle, PA-C  methocarbamol (ROBAXIN) 500 MG tablet Take 1 tablet (500 mg total) by mouth 2 (two) times daily. 03/19/23   Durwin Glaze, MD  naproxen (NAPROSYN) 375 MG tablet Take 1 tablet (375 mg total) by mouth 2 (two) times daily. 03/17/23   Henderly, Britni A, PA-C  pantoprazole (PROTONIX) 20 MG tablet Take 1 tablet (20 mg total) by mouth daily. 10/10/21   [name], MD  triamcinolone cream (KENALOG) 0.1 % Apply 1 Application topically 2 (two) times daily. Apply to rash 09/27/22   Rondel Baton, MD      Allergies    Codeine,  Ibuprofen, and Imdur [isosorbide nitrate]    Review of Systems   Review of Systems  All other systems reviewed and are negative.   Physical Exam Updated Vital Signs BP (!) 150/76 (BP Location: Left Arm)   Pulse 80   Temp 97.9 F (36.6 C)   Resp 14   SpO2 100%  Physical Exam Vitals and nursing note reviewed.  Constitutional:      General: She is not in acute distress.    Appearance: She is well-developed.  HENT:     Head: Atraumatic.  Eyes:     Conjunctiva/sclera: Conjunctivae normal.  Pulmonary:     Effort: Pulmonary effort is normal.  Musculoskeletal:     Cervical back: Neck supple.  Skin:    Findings: No rash.  Neurological:      Mental Status: She is alert.  Psychiatric:        Mood and Affect: Mood normal.     ED Results / Procedures / Treatments   Labs (all labs ordered are listed, but only abnormal results are displayed) Labs Reviewed - No data to display  EKG None  Radiology No results found.  Procedures Procedures    Medications Ordered in ED Medications  acetaminophen (TYLENOL) tablet 1,000 mg (1,000 mg Oral Given 05/03/23 0754)    ED Course/ Medical Decision Making/ A&P                                 Medical Decision Making  BP (!) 150/76 (BP Location: Left Arm)   Pulse 80   Temp 97.9 F (36.6 C)   Resp 14   SpO2 100%   52:51 AM  78 year old female history of cognitive impairment, prior stroke, diabetes, hypertension, GERD, frequent ER visits presenting with complaints of bodyaches. Please note this is patient's third visit within the past 24 hours, 91st times in the past 3 months.  Patient states she is hurting everywhere.  She attributed to a fall that she had quite a while back.  She also complaining of some sore throat.  She mention she does not have Tylenol at home.  She also states she is having trouble seeing at night and she was evaluated in the ER yesterday for the same complaint, was discharged but cannot go home until daylight and decided to check back in. No fever chills nausea vomiting diarrhea  Patient is resting comfortably in the chair.  She is wearing a soft collar which she always wears.  She is speaking complete sentences she is not in any acute discomfort.  Some generalized tenderness when I palpate her arms and legs without focal point tenderness.  EMR review patient had a positive COVID test a few days prior.  I suspect her body aches is likely in the setting of a viral infection.  She is not having symptoms to suggest pneumonia.  Tylenol given.  Patient stable for discharge.        Final Clinical Impression(s) / ED Diagnoses Final diagnoses:  Viral illness     Rx / DC Orders ED Discharge Orders     None         Fayrene Helper, PA-C 05/03/23 1610    Coral Spikes, DO 05/03/23 1450

## 2023-05-04 ENCOUNTER — Other Ambulatory Visit: Payer: Self-pay

## 2023-05-04 ENCOUNTER — Encounter (HOSPITAL_COMMUNITY): Payer: Self-pay | Admitting: *Deleted

## 2023-05-04 ENCOUNTER — Emergency Department (HOSPITAL_COMMUNITY)
Admission: EM | Admit: 2023-05-04 | Discharge: 2023-05-05 | Disposition: A | Discharge status: Home / Self Care | Attending: Emergency Medicine | Admitting: Emergency Medicine

## 2023-05-04 DIAGNOSIS — R519 Headache, unspecified: Secondary | ICD-10-CM | POA: Diagnosis present

## 2023-05-04 DIAGNOSIS — M542 Cervicalgia: Secondary | ICD-10-CM | POA: Diagnosis not present

## 2023-05-04 DIAGNOSIS — G8929 Other chronic pain: Secondary | ICD-10-CM | POA: Diagnosis present

## 2023-05-04 NOTE — ED Triage Notes (Signed)
 Pt c/o a headache for many months

## 2023-05-05 ENCOUNTER — Other Ambulatory Visit: Payer: Self-pay

## 2023-05-05 ENCOUNTER — Encounter (HOSPITAL_COMMUNITY): Payer: Self-pay

## 2023-05-05 ENCOUNTER — Emergency Department (HOSPITAL_COMMUNITY)
Admission: EM | Admit: 2023-05-05 | Discharge: 2023-05-06 | Disposition: A | Discharge status: Home / Self Care | Source: Home / Self Care | Attending: Emergency Medicine | Admitting: Emergency Medicine

## 2023-05-05 DIAGNOSIS — M542 Cervicalgia: Secondary | ICD-10-CM | POA: Insufficient documentation

## 2023-05-05 DIAGNOSIS — G8929 Other chronic pain: Secondary | ICD-10-CM | POA: Insufficient documentation

## 2023-05-05 DIAGNOSIS — R519 Headache, unspecified: Secondary | ICD-10-CM | POA: Diagnosis not present

## 2023-05-05 MED ORDER — ACETAMINOPHEN 500 MG PO TABS
1000.0000 mg | ORAL_TABLET | Freq: Once | ORAL | Status: AC
  Administered 2023-05-05: 1000 mg via ORAL
  Filled 2023-05-05: qty 2

## 2023-05-05 NOTE — ED Triage Notes (Signed)
 Hurting all over for a week. No injury, patient is able to move all joints.

## 2023-05-05 NOTE — ED Provider Notes (Signed)
 [place] EMERGENCY DEPARTMENT AT [facility] Provider Note   CSN: [id number] Arrival date & time: 05/04/23  2114     History  Chief Complaint  Patient presents with   Headache    [name] is a 78 y.o. female.  Patient reminds me that [place] and didn't realize that taking 2 500 mg tylenol is the same as the 1000 mg tylenol that we give her here. No new symptoms.    Headache      Home Medications Prior to Admission medications   Medication Sig Start Date End Date Taking? Authorizing Provider  acetaminophen (TYLENOL) 500 MG tablet Take 1 tablet (500 mg total) by mouth every 6 (six) hours as needed. 05/01/23   Keith, Kayla N, PA-C  albuterol (VENTOLIN HFA) 108 (90 Base) MCG/ACT inhaler Inhale 1-2 puffs into the lungs every 6 (six) hours as needed for wheezing or shortness of breath. 04/22/20   Gregoria Leas, NP  amLODipine (NORVASC) 10 MG tablet Take 1 tablet (10 mg total) by mouth daily. 01/23/23   Roemhildt, Lorin T, PA-C  chlorthalidone (HYGROTON) 25 MG tablet TAKE 1 TABLET (25 MG TOTAL) BY MOUTH DAILY. 11/20/21 11/20/22  Paseda, Folashade R, FNP  diphenhydrAMINE (BENADRYL) 25 MG tablet Take 1 tablet (25 mg total) by mouth every 6 (six) hours as needed for itching or allergies. 10/03/22   Debbra Fairy, PA-C  gabapentin (NEURONTIN) 100 MG capsule Take 1 capsule (100 mg total) by mouth 3 (three) times daily as needed. 04/30/23   Edson Graces, MD  hydrocortisone cream 1 % Apply to affected area 2 times daily 10/22/22   Coretha Dew, PA-C  hydrOXYzine (ATARAX) 25 MG tablet Take 1 tablet (25 mg total) by mouth every 8 (eight) hours as needed. 11/23/22   Henderly, Britni A, PA-C  lidocaine (LIDODERM) 5 % Place 1 patch onto the skin daily. Remove & Discard patch within 12 hours or as directed by MD 04/16/23   Barrett, Jamie N, PA-C  loratadine (CLARITIN) 10 MG tablet Take 1 tablet (10 mg total) by mouth daily. 04/06/23   Barrett, Jamie N, PA-C   metFORMIN (GLUCOPHAGE) 500 MG tablet Take 1 tablet (500 mg total) by mouth 2 (two) times daily with a meal. 11/03/22 11/03/23  Janalee Mcmurray, PA-C  methocarbamol (ROBAXIN) 500 MG tablet Take 1 tablet (500 mg total) by mouth 2 (two) times daily. 03/19/23   Carin Charleston, MD  naproxen (NAPROSYN) 375 MG tablet Take 1 tablet (375 mg total) by mouth 2 (two) times daily. 03/17/23   Henderly, Britni A, PA-C  pantoprazole (PROTONIX) 20 MG tablet Take 1 tablet (20 mg total) by mouth daily. 10/10/21   Haviland, Julie, MD  triamcinolone cream (KENALOG) 0.1 % Apply 1 Application topically 2 (two) times daily. Apply to rash 09/27/22   Ninetta Basket, MD      Allergies    Codeine, Ibuprofen, and Imdur [isosorbide nitrate]    Review of Systems   Review of Systems  Neurological:  Positive for headaches.    Physical Exam Updated Vital Signs BP (!) 155/66 (BP Location: Left Arm)   Pulse 71   Temp 97.9 F (36.6 C)   Resp 16   Ht 5\' 4"  (1.626 m)   Wt 49 kg   SpO2 99%   BMI 18.54 kg/m  Physical Exam Vitals and nursing note reviewed.  Constitutional:      Appearance: She is well-developed.  HENT:  Head: Normocephalic and atraumatic.  Neck:     Comments: Soft collar in place on patient arrival Cardiovascular:     Rate and Rhythm: Normal rate and regular rhythm.  Pulmonary:     Effort: No respiratory distress.     Breath sounds: No stridor.  Abdominal:     General: There is no distension.  Musculoskeletal:     Cervical back: Normal range of motion.  Neurological:     Mental Status: She is alert.     ED Results / Procedures / Treatments   Labs (all labs ordered are listed, but only abnormal results are displayed) Labs Reviewed - No data to display  EKG None  Radiology No results found.  Procedures Procedures    Medications Ordered in ED Medications  acetaminophen (TYLENOL) tablet 1,000 mg (has no administration in time range)    ED Course/ Medical Decision Making/  A&P                                 Medical Decision Making Risk OTC drugs.   Patient reminds me that she fell on the city bus many months back and didn't realize that taking 2 500 mg tylenol is the same as the 1000 mg tylenol that we give her here. No new symptoms to suggest need for repeat workup. .   Final Clinical Impression(s) / ED Diagnoses Final diagnoses:  Nonintractable headache, unspecified chronicity pattern, unspecified headache type    Rx / DC Orders ED Discharge Orders     None         Mirai Greenwood, Reymundo Caulk, MD 05/05/23 [phone]

## 2023-05-06 DIAGNOSIS — R519 Headache, unspecified: Secondary | ICD-10-CM | POA: Diagnosis not present

## 2023-05-06 MED ORDER — LIDOCAINE 5 % EX PTCH
1.0000 | MEDICATED_PATCH | CUTANEOUS | Status: DC
  Administered 2023-05-06: 1 via TRANSDERMAL
  Filled 2023-05-06: qty 1

## 2023-05-06 MED ORDER — ACETAMINOPHEN 500 MG PO TABS
1000.0000 mg | ORAL_TABLET | Freq: Once | ORAL | Status: AC
  Administered 2023-05-06: 1000 mg via ORAL
  Filled 2023-05-06: qty 2

## 2023-05-06 NOTE — ED Provider Notes (Signed)
  [facility]PT [facility] Emergency Department Provider Note MRN:  [id number]  Arrival date & time: 05/06/23     Chief Complaint   Generalized Body Aches   History of Present Illness   [name] is a 78 y.o. year-old female presents to the ED with chief complaint of chronic neck pain.  She is seen almost daily for the same.  Denies any new changes in condition.  She states that she would like to try a Lidoderm patch tonight.  She denies any weakness, numbness, tingling.  History provided by patient.   Review of Systems  Pertinent positive and negative review of systems noted in HPI.    Physical Exam   Vitals:   05/05/23 2037 05/05/23 2349  BP:  (!) 168/78  Pulse:  63  Resp:  16  Temp:  97.6 F (36.4 C)  SpO2: 99% 96%    CONSTITUTIONAL:  non toxic-appearing, NAD NEURO:  Alert and oriented x 3, CN 3-12 grossly intact EYES:  eyes equal and reactive ENT/NECK:  Supple, no stridor  CARDIO:  normal rate, regular rhythm, appears well-perfused  PULM:  No respiratory distress, CTAB GI/GU:  non-distended,  MSK/SPINE:  No gross deformities, no edema, moves all extremities  SKIN:  no rash, atraumatic   *Additional and/or pertinent findings included in MDM below  Diagnostic and Interventional Summary    EKG Interpretation Date/Time:    Ventricular Rate:    PR Interval:    QRS Duration:    QT Interval:    QTC Calculation:   R Axis:      Text Interpretation:         Labs Reviewed - No data to display  No orders to display    Medications  acetaminophen (TYLENOL) tablet 1,000 mg (has no administration in time range)  lidocaine (LIDODERM) 5 % 1 patch (has no administration in time range)     Procedures  /  Critical Care Procedures  ED Course and Medical Decision Making  I have reviewed the triage vital signs, the nursing notes, and pertinent available records from the EMR.  Social Determinants Affecting Complexity of Care: Patient has no  clinically significant social determinants affecting this chief complaint..   ED Course:    Medical Decision Making Patient in her normal state of health.  Complaining of chronic pain.  Will give a dose of Tylenol and Lidoderm.  Risk OTC drugs. Prescription drug management.         Consultants: No consultations were needed in caring for this patient.   Treatment and Plan: Emergency department workup does not suggest an emergent condition requiring admission or immediate intervention beyond  what has been performed at this time. The patient is safe for discharge and has  been instructed to return immediately for worsening symptoms, change in  symptoms or any other concerns    Final Clinical Impressions(s) / ED Diagnoses     ICD-10-CM   1. Other chronic pain  G89.29       ED Discharge Orders     None         Discharge Instructions Discussed with and Provided to Patient:   Discharge Instructions   None      Roxy Horseman, PA-C 05/06/23 0157    Sloan Leiter, DO 05/06/23 [phone]

## 2023-05-07 ENCOUNTER — Emergency Department (HOSPITAL_COMMUNITY)
Admission: EM | Admit: 2023-05-07 | Discharge: 2023-05-08 | Disposition: A | Discharge status: Home / Self Care | Attending: Emergency Medicine | Admitting: Emergency Medicine

## 2023-05-07 ENCOUNTER — Other Ambulatory Visit: Payer: Self-pay

## 2023-05-07 ENCOUNTER — Encounter (HOSPITAL_COMMUNITY): Payer: Self-pay | Admitting: Emergency Medicine

## 2023-05-07 DIAGNOSIS — G8929 Other chronic pain: Secondary | ICD-10-CM | POA: Diagnosis present

## 2023-05-07 DIAGNOSIS — E119 Type 2 diabetes mellitus without complications: Secondary | ICD-10-CM | POA: Diagnosis not present

## 2023-05-07 DIAGNOSIS — I10 Essential (primary) hypertension: Secondary | ICD-10-CM | POA: Diagnosis not present

## 2023-05-07 DIAGNOSIS — M542 Cervicalgia: Secondary | ICD-10-CM | POA: Diagnosis not present

## 2023-05-07 NOTE — ED Triage Notes (Signed)
 Patient states "I keep telling yall ever since [place] the city [place] I have pain all over and tylenol is not strong enough."  Patient has soft neck collar on.

## 2023-05-08 ENCOUNTER — Encounter (HOSPITAL_COMMUNITY): Payer: Self-pay | Admitting: Emergency Medicine

## 2023-05-08 ENCOUNTER — Other Ambulatory Visit: Payer: Self-pay

## 2023-05-08 ENCOUNTER — Emergency Department (HOSPITAL_COMMUNITY)
Admission: EM | Admit: 2023-05-08 | Discharge: 2023-05-09 | Discharge status: Against Medical Advice | Attending: Emergency Medicine | Admitting: Emergency Medicine

## 2023-05-08 DIAGNOSIS — M79605 Pain in left leg: Secondary | ICD-10-CM | POA: Insufficient documentation

## 2023-05-08 DIAGNOSIS — Z5321 Procedure and treatment not carried out due to patient leaving prior to being seen by health care provider: Secondary | ICD-10-CM | POA: Insufficient documentation

## 2023-05-08 DIAGNOSIS — M542 Cervicalgia: Secondary | ICD-10-CM | POA: Insufficient documentation

## 2023-05-08 DIAGNOSIS — M25531 Pain in right wrist: Secondary | ICD-10-CM | POA: Insufficient documentation

## 2023-05-08 DIAGNOSIS — M25532 Pain in left wrist: Secondary | ICD-10-CM | POA: Insufficient documentation

## 2023-05-08 DIAGNOSIS — G8929 Other chronic pain: Secondary | ICD-10-CM | POA: Diagnosis not present

## 2023-05-08 DIAGNOSIS — M79604 Pain in right leg: Secondary | ICD-10-CM | POA: Insufficient documentation

## 2023-05-08 MED ORDER — LIDOCAINE 5 % EX PTCH
1.0000 | MEDICATED_PATCH | CUTANEOUS | Status: DC
  Administered 2023-05-08: 1 via TRANSDERMAL
  Filled 2023-05-08: qty 1

## 2023-05-08 MED ORDER — ACETAMINOPHEN 500 MG PO TABS
1000.0000 mg | ORAL_TABLET | Freq: Once | ORAL | Status: AC
  Administered 2023-05-08: 1000 mg via ORAL
  Filled 2023-05-08: qty 2

## 2023-05-08 MED ORDER — LIDOCAINE 5 % EX PTCH: 1.0000 | MEDICATED_PATCH | CUTANEOUS | 0 refills | Status: DC

## 2023-05-08 NOTE — Discharge Instructions (Signed)
 Your history, exam, and evaluation are consistent with your chronic neck pain flaring up.  With your spasms you have had before, the Lidoderm patch and Tylenol seem to help.  Please fill the prescription for Lidoderm patches and use them.  Please follow-up with your primary doctor.  If any symptoms change or worsen acutely, please return to the nearest Emergency Department.

## 2023-05-08 NOTE — ED Provider Triage Note (Signed)
 Emergency Medicine Provider Triage Evaluation Note  [name] , a 78 y.o. female  was evaluated in triage.  Pt complains of bilateral wrist pain and neck pain. Patient frequents the ER with similar complaints ans presentation from injury in distant past.  Review of Systems  Positive:  Negative:   Physical Exam  BP 137/63 (BP Location: Left Arm)   Pulse 71   Temp 98 F (36.7 C) (Oral)   Resp 18   Ht 5\' 4"  (1.626 m)   Wt 49 kg   SpO2 100%   BMI 18.54 kg/m  Gen:   Awake, no distress   Resp:  Normal effort  MSK:   Moves extremities without difficulty  Other:  Palpable pulses. Ambulatory. Moving all extremities. Neck soft collar on.   Medical Decision Making  Medically screening exam initiated at 9:35 PM.  Appropriate orders placed.  [name] was informed that the remainder of the evaluation will be completed by another provider, this initial triage assessment does not replace that evaluation, and the importance of remaining in the ED until their evaluation is complete.     Spence Dux, [place] 05/08/23 2136

## 2023-05-08 NOTE — ED Provider Notes (Signed)
 [place] EMERGENCY DEPARTMENT AT [facility] Provider Note   CSN: [id number] Arrival date & time: 05/07/23  1720     History  Chief Complaint  Patient presents with   Pain    [name] is a 78 y.o. female.  The history is provided by the patient and medical records. No language interpreter was used.  Neck Injury This is a chronic problem. Episode onset: years. The problem has not changed since onset.Pertinent negatives include no chest pain, no abdominal pain, no headaches and no shortness of breath. Nothing aggravates the symptoms. Nothing relieves the symptoms. She has tried nothing for the symptoms. The treatment provided no relief.       Home Medications Prior to Admission medications   Medication Sig Start Date End Date Taking? Authorizing Provider  acetaminophen (TYLENOL) 500 MG tablet Take 1 tablet (500 mg total) by mouth every 6 (six) hours as needed. 05/01/23   Dolphus Jenny, PA-C  albuterol (VENTOLIN HFA) 108 (90 Base) MCG/ACT inhaler Inhale 1-2 puffs into the lungs every 6 (six) hours as needed for wheezing or shortness of breath. 04/22/20   Barbette Merino, NP  amLODipine (NORVASC) 10 MG tablet Take 1 tablet (10 mg total) by mouth daily. 01/23/23   Roemhildt, Lorin T, PA-C  chlorthalidone (HYGROTON) 25 MG tablet TAKE 1 TABLET (25 MG TOTAL) BY MOUTH DAILY. 11/20/21 11/20/22  Donell Beers, FNP  diphenhydrAMINE (BENADRYL) 25 MG tablet Take 1 tablet (25 mg total) by mouth every 6 (six) hours as needed for itching or allergies. 10/03/22   Fayrene Helper, PA-C  gabapentin (NEURONTIN) 100 MG capsule Take 1 capsule (100 mg total) by mouth 3 (three) times daily as needed. 04/30/23   Sabas Sous, MD  hydrocortisone cream 1 % Apply to affected area 2 times daily 10/22/22   Garlon Hatchet, PA-C  hydrOXYzine (ATARAX) 25 MG tablet Take 1 tablet (25 mg total) by mouth every 8 (eight) hours as needed. 11/23/22   Henderly, Britni A, PA-C  lidocaine (LIDODERM) 5 % Place 1  patch onto the skin daily. Remove & Discard patch within 12 hours or as directed by MD 04/16/23   Barrett, Horald Chestnut, PA-C  loratadine (CLARITIN) 10 MG tablet Take 1 tablet (10 mg total) by mouth daily. 04/06/23   Barrett, Horald Chestnut, PA-C  metFORMIN (GLUCOPHAGE) 500 MG tablet Take 1 tablet (500 mg total) by mouth 2 (two) times daily with a meal. 11/03/22 11/03/23  Gareth Eagle, PA-C  methocarbamol (ROBAXIN) 500 MG tablet Take 1 tablet (500 mg total) by mouth 2 (two) times daily. 03/19/23   Durwin Glaze, MD  naproxen (NAPROSYN) 375 MG tablet Take 1 tablet (375 mg total) by mouth 2 (two) times daily. 03/17/23   Henderly, Britni A, PA-C  pantoprazole (PROTONIX) 20 MG tablet Take 1 tablet (20 mg total) by mouth daily. 10/10/21   [name], MD  triamcinolone cream (KENALOG) 0.1 % Apply 1 Application topically 2 (two) times daily. Apply to rash 09/27/22   Rondel Baton, MD      Allergies    Codeine, Ibuprofen, and Imdur [isosorbide nitrate]    Review of Systems   Review of Systems  Constitutional:  Negative for chills, fatigue and fever.  HENT:  Negative for congestion.   Respiratory:  Negative for cough, chest tightness and shortness of breath.   Cardiovascular:  Negative for chest pain.  Gastrointestinal:  Negative for abdominal pain, constipation, diarrhea, nausea and vomiting.  Genitourinary:  Negative for dysuria.  Musculoskeletal:  Positive for neck pain. Negative for back pain and neck stiffness.  Skin:  Negative for rash and wound.  Neurological:  Negative for headaches.  Psychiatric/Behavioral:  Negative for agitation.   All other systems reviewed and are negative.   Physical Exam Updated Vital Signs BP (!) 139/97   Pulse 72   Temp 98.2 F (36.8 C)   Resp 18   Ht 5\' 4"  (1.626 m)   Wt 49 kg   SpO2 99%   BMI 18.54 kg/m  Physical Exam Vitals and nursing note reviewed.  Constitutional:      General: She is not in acute distress.    Appearance: She is well-developed. She  is not ill-appearing, toxic-appearing or diaphoretic.  HENT:     Head: Normocephalic and atraumatic.     Nose: No congestion or rhinorrhea.     Mouth/Throat:     Mouth: Mucous membranes are moist.     Pharynx: No oropharyngeal exudate or posterior oropharyngeal erythema.  Eyes:     Extraocular Movements: Extraocular movements intact.     Conjunctiva/sclera: Conjunctivae normal.     Pupils: Pupils are equal, round, and reactive to light.  Neck:     Comments: In soft collar that pt frequenrtly has on Cardiovascular:     Rate and Rhythm: Normal rate and regular rhythm.     Heart sounds: No murmur heard. Pulmonary:     Effort: Pulmonary effort is normal. No respiratory distress.     Breath sounds: Normal breath sounds. No wheezing, rhonchi or rales.  Chest:     Chest wall: No tenderness.  Abdominal:     General: Abdomen is flat.     Palpations: Abdomen is soft.     Tenderness: There is no abdominal tenderness. There is no guarding or rebound.  Musculoskeletal:        General: No swelling or tenderness.     Cervical back: Neck supple.     Right lower leg: No edema.     Left lower leg: No edema.  Skin:    General: Skin is warm and dry.     Capillary Refill: Capillary refill takes less than 2 seconds.     Findings: No erythema or rash.  Neurological:     General: No focal deficit present.     Mental Status: She is alert.     Sensory: No sensory deficit.     Motor: No weakness.  Psychiatric:        Mood and Affect: Mood normal.     ED Results / Procedures / Treatments   Labs (all labs ordered are listed, but only abnormal results are displayed) Labs Reviewed - No data to display  EKG None  Radiology No results found.  Procedures Procedures    Medications Ordered in ED Medications  lidocaine (LIDODERM) 5 % 1 patch (1 patch Transdermal Patch Applied 05/08/23 0850)  acetaminophen (TYLENOL) tablet 1,000 mg (1,000 mg Oral Given 05/08/23 0849)    ED Course/ Medical  Decision Making/ A&P                                 Medical Decision Making Risk OTC drugs. Prescription drug management.    [name] is a 78 y.o. 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  According to patient, the Tylenol she was given several days ago has worn off and she is still having her neck pain.  She is in her soft collar that she frequently wears to the emergency department.  She denies any new trauma or any new changes.  She denies any fevers, chills, congestion, cough, nausea, vomiting, constipation, diarrhea, or urinary changes.  She is able to ambulate.  Denies any new falls or injuries.  She reports her pain is moderate in her neck as she frequently has it.  Patient reports she had some relief with a lighter patch she received last time she was here with some Tylenol.  On exam, lungs clear.  Chest nontender.  Abdomen nontender.  Patient moving all extremities.  She is complaining of mild neck pain but is not focally tender on my exam.  Patient otherwise resting comfortably without other complaints.  Given her similar presentation to prior, we agreed to hold on extensive lab or imaging workup today and will instead give her a another Lidoderm patch and some Tylenol.  Patient now says symptoms are resolved and she would like to go home.  Will discharge with a prescription printed for some Lidoderm patches and she can follow-up with the PCP.  Patient also did not want to speak to case management and would like to go home.  Patient discharged with improvement in symptoms of her chronic injuries.         Final Clinical Impression(s) / ED Diagnoses Final diagnoses:  Other chronic pain    Rx / DC Orders ED Discharge Orders          Ordered    lidocaine (LIDODERM) 5  %  Every 24 hours        05/08/23 1007            Clinical Impression: 1. Other chronic pain     Disposition: Admit  This note was prepared with assistance of Dragon voice recognition software. Occasional wrong-word or sound-a-like substitutions may have occurred due to the inherent limitations of voice recognition software.      Erendira Crabtree, Marine Sia, MD 05/08/23 [phone]

## 2023-05-08 NOTE — ED Triage Notes (Signed)
  Patient comes in with neck and bilateral leg pain that has been going on for two weeks.  Patient [place] city [place] 2 [place] and hasn't felt right since.  No pain at this time.

## 2023-05-20 ENCOUNTER — Emergency Department (HOSPITAL_COMMUNITY)
Admission: EM | Admit: 2023-05-20 | Discharge: 2023-05-21 | Disposition: A | Discharge status: Home / Self Care | Attending: Emergency Medicine | Admitting: Emergency Medicine

## 2023-05-20 DIAGNOSIS — G8929 Other chronic pain: Secondary | ICD-10-CM | POA: Diagnosis not present

## 2023-05-20 DIAGNOSIS — M542 Cervicalgia: Secondary | ICD-10-CM | POA: Diagnosis present

## 2023-05-20 DIAGNOSIS — E119 Type 2 diabetes mellitus without complications: Secondary | ICD-10-CM | POA: Diagnosis not present

## 2023-05-20 DIAGNOSIS — M791 Myalgia, unspecified site: Secondary | ICD-10-CM | POA: Diagnosis present

## 2023-05-20 DIAGNOSIS — Z79899 Other long term (current) drug therapy: Secondary | ICD-10-CM | POA: Diagnosis not present

## 2023-05-20 DIAGNOSIS — I10 Essential (primary) hypertension: Secondary | ICD-10-CM | POA: Diagnosis not present

## 2023-05-20 DIAGNOSIS — Z7984 Long term (current) use of oral hypoglycemic drugs: Secondary | ICD-10-CM | POA: Diagnosis not present

## 2023-05-20 DIAGNOSIS — Z5321 Procedure and treatment not carried out due to patient leaving prior to being seen by health care provider: Secondary | ICD-10-CM | POA: Diagnosis not present
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
## 2023-05-21 ENCOUNTER — Other Ambulatory Visit: Payer: Self-pay

## 2023-05-21 ENCOUNTER — Emergency Department (HOSPITAL_COMMUNITY)
Admission: EM | Admit: 2023-05-21 | Discharge: 2023-05-21 | Discharge status: Against Medical Advice | Source: Home / Self Care

## 2023-05-21 ENCOUNTER — Encounter (HOSPITAL_COMMUNITY): Payer: Self-pay

## 2023-05-21 DIAGNOSIS — G8929 Other chronic pain: Secondary | ICD-10-CM | POA: Diagnosis not present

## 2023-05-21 DIAGNOSIS — M542 Cervicalgia: Secondary | ICD-10-CM | POA: Insufficient documentation

## 2023-05-21 DIAGNOSIS — Z5321 Procedure and treatment not carried out due to patient leaving prior to being seen by health care provider: Secondary | ICD-10-CM | POA: Insufficient documentation

## 2023-05-21 MED ORDER — NAPROXEN 250 MG PO TABS
500.0000 mg | ORAL_TABLET | Freq: Once | ORAL | Status: AC
  Administered 2023-05-21: 500 mg via ORAL
  Filled 2023-05-21: qty 2

## 2023-05-21 NOTE — Discharge Instructions (Signed)
 Please resume over the counter pain medications at home. Follow up with primary care as needed.
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
## 2023-05-21 NOTE — ED Triage Notes (Signed)
 Pain complains of neck pain after falling on [employer] several month ago. Pt states tylenol  is not helping. Pt wearing soft neck collar. Was seen here yesterday with same complaint.

## 2023-05-24 ENCOUNTER — Other Ambulatory Visit: Payer: Self-pay

## 2023-05-24 ENCOUNTER — Emergency Department (HOSPITAL_COMMUNITY)
Admission: EM | Admit: 2023-05-24 | Discharge: 2023-05-24 | Discharge status: Against Medical Advice | Attending: Emergency Medicine | Admitting: Emergency Medicine

## 2023-05-24 DIAGNOSIS — R52 Pain, unspecified: Secondary | ICD-10-CM | POA: Diagnosis present

## 2023-05-24 DIAGNOSIS — Z5321 Procedure and treatment not carried out due to patient leaving prior to being seen by health care provider: Secondary | ICD-10-CM | POA: Insufficient documentation

## 2023-05-24 NOTE — ED Notes (Signed)
 Patient observed walking out of the department while this RN was assisting another patient out of a vehicle.

## 2023-05-28 ENCOUNTER — Emergency Department (HOSPITAL_COMMUNITY)
Admission: EM | Admit: 2023-05-28 | Discharge: 2023-05-28 | Disposition: A | Discharge status: Home / Self Care | Attending: Emergency Medicine | Admitting: Emergency Medicine

## 2023-05-28 ENCOUNTER — Other Ambulatory Visit: Payer: Self-pay

## 2023-05-28 DIAGNOSIS — I10 Essential (primary) hypertension: Secondary | ICD-10-CM | POA: Insufficient documentation

## 2023-05-28 DIAGNOSIS — R030 Elevated blood-pressure reading, without diagnosis of hypertension: Secondary | ICD-10-CM

## 2023-05-28 DIAGNOSIS — Z760 Encounter for issue of repeat prescription: Secondary | ICD-10-CM | POA: Insufficient documentation

## 2023-05-28 DIAGNOSIS — Z79899 Other long term (current) drug therapy: Secondary | ICD-10-CM | POA: Diagnosis not present

## 2023-05-28 MED ORDER — DICLOFENAC SODIUM 1 % EX GEL: 4.0000 g | Freq: Four times a day (QID) | CUTANEOUS | 0 refills | Status: DC

## 2023-05-28 NOTE — Discharge Instructions (Signed)
 Please be sure to take your blood pressure medicines every day as prescribed.  I have refilled your Voltaren  gel.
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
## 2023-05-28 NOTE — ED Provider Notes (Signed)
 [facility]ERGENC[facility] DEPARTMENT AT [facility] Provider Note   CSN: [id number] Arrival date & time: 05/28/23  1250     History  Chief Complaint  Patient presents with   Medication Refill    [name] is a 78 y.o. female.  Patient well known to the department presents today requesting refill of her voltaren  gel. Notes she missed her bp meds this morning but has plenty of refills of this. No other complaints at this time  The history is provided by the patient. No language interpreter was used.  Medication Refill      Home Medications Prior to Admission medications   Medication Sig Start Date End Date Taking? Authorizing Provider  acetaminophen  (TYLENOL ) 500 MG tablet Take 1 tablet (500 mg total) by mouth every 6 (six) hours as needed. 05/01/23   Keith, Kayla N, PA-C  albuterol  (VENTOLIN  HFA) 108 (90 Base) MCG/ACT inhaler Inhale 1-2 puffs into the lungs every 6 (six) hours as needed for wheezing or shortness of breath. 04/22/20   Gregoria Leas, NP  amLODipine  (NORVASC ) 10 MG tablet Take 1 tablet (10 mg total) by mouth daily. 01/23/23   Roemhildt, Lorin T, PA-C  chlorthalidone  (HYGROTON ) 25 MG tablet TAKE 1 TABLET (25 MG TOTAL) BY MOUTH DAILY. 11/20/21 11/20/22  Paseda, Folashade R, FNP  diphenhydrAMINE  (BENADRYL ) 25 MG tablet Take 1 tablet (25 mg total) by mouth every 6 (six) hours as needed for itching or allergies. 10/03/22   Debbra Fairy, PA-C  gabapentin  (NEURONTIN ) 100 MG capsule Take 1 capsule (100 mg total) by mouth 3 (three) times daily as needed. 04/30/23   Bero, Michael M, MD  hydrocortisone  cream 1 % Apply to affected area 2 times daily 10/22/22   Coretha Dew, PA-C  hydrOXYzine  (ATARAX ) 25 MG tablet Take 1 tablet (25 mg total) by mouth every 8 (eight) hours as needed. 11/23/22   Henderly, Britni A, PA-C  lidocaine  (LIDODERM ) 5 % Place 1 patch onto the skin daily. Remove & Discard patch within 12 hours or as directed by MD 04/16/23   Barrett, Jamie N, PA-C  lidocaine   (LIDODERM ) 5 % Place 1 patch onto the skin daily. Remove & Discard patch within 12 hours or as directed by MD 05/08/23   Tegeler, Marine Sia, MD  loratadine  (CLARITIN ) 10 MG tablet Take 1 tablet (10 mg total) by mouth daily. 04/06/23   Barrett, Kandace Organ, PA-C  metFORMIN  (GLUCOPHAGE ) 500 MG tablet Take 1 tablet (500 mg total) by mouth 2 (two) times daily with a meal. 11/03/22 11/03/23  Janalee Mcmurray, PA-C  methocarbamol  (ROBAXIN ) 500 MG tablet Take 1 tablet (500 mg total) by mouth 2 (two) times daily. 03/19/23   Carin Charleston, MD  naproxen  (NAPROSYN ) 375 MG tablet Take 1 tablet (375 mg total) by mouth 2 (two) times daily. 03/17/23   Henderly, Britni A, PA-C  pantoprazole  (PROTONIX ) 20 MG tablet Take 1 tablet (20 mg total) by mouth daily. 10/10/21   Sueellen Emery, MD  triamcinolone  cream (KENALOG ) 0.1 % Apply 1 Application topically 2 (two) times daily. Apply to rash 09/27/22   Ninetta Basket, MD      Allergies    Codeine, Ibuprofen , and Imdur [isosorbide nitrate]    Review of Systems   Review of Systems  All other systems reviewed and are negative.   Physical Exam Updated Vital Signs BP (!) 189/70   Pulse 70   Temp 98 F (36.7 C)   Resp 16   Ht 5'  4" (1.626 m)   Wt 48.9 kg   SpO2 99%   BMI 18.50 kg/m  Physical Exam Vitals and nursing note reviewed.  Constitutional:      General: She is not in acute distress.    Appearance: Normal appearance. She is normal weight. She is not ill-appearing, toxic-appearing or diaphoretic.  HENT:     Head: Normocephalic and atraumatic.  Cardiovascular:     Rate and Rhythm: Normal rate.  Pulmonary:     Effort: Pulmonary effort is normal. No respiratory distress.  Musculoskeletal:        General: Normal range of motion.     Cervical back: Normal range of motion.  Skin:    General: Skin is warm and dry.  Neurological:     General: No focal deficit present.     Mental Status: She is alert.  Psychiatric:        Mood and Affect: Mood  normal.        Behavior: Behavior normal.     ED Results / Procedures / Treatments   Labs (all labs ordered are listed, but only abnormal results are displayed) Labs Reviewed - No data to display  EKG None  Radiology No results found.  Procedures Procedures    Medications Ordered in ED Medications - No data to display  ED Course/ Medical Decision Making/ A&P                                 Medical Decision Making  Patient well known to the department with 91 visits in 6 months presents today requesting refill of voltaren  gel.  She is afebrile, nontoxic-appearing, and in no acute distress reassuring vital signs.  Of note, patient's blood pressure is notably elevated, however she states that she did miss her dose of medicine this morning by accident.  Recommend that she take this when she gets home.  She has no signs or symptoms of hypertensive emergency.  No indication for labs at this time.  Patient's prescription refilled per her request.  No other needs at this time. Evaluation and diagnostic testing in the emergency department does not suggest an emergent condition requiring admission or immediate intervention beyond what has been performed at this time.  Plan for discharge with close PCP follow-up.  Patient is understanding and amenable with plan, educated on red flag symptoms that would prompt immediate return.  Patient discharged in stable condition.  Final Clinical Impression(s) / ED Diagnoses Final diagnoses:  Medication refill  Elevated blood pressure reading    Rx / DC Orders ED Discharge Orders          Ordered    diclofenac  Sodium (VOLTAREN ) 1 % GEL  4 times daily        05/28/23 1332          An After Visit Summary was printed and given to the patient.     Felice Hope A, PA-C 05/28/23 1332    Mozell Arias, MD 05/28/23 1606

## 2023-05-30 ENCOUNTER — Encounter (HOSPITAL_COMMUNITY): Payer: Self-pay

## 2023-05-30 ENCOUNTER — Other Ambulatory Visit: Payer: Self-pay

## 2023-05-30 ENCOUNTER — Emergency Department (HOSPITAL_COMMUNITY)
Admission: EM | Admit: 2023-05-30 | Discharge: 2023-05-31 | Disposition: A | Discharge status: Home / Self Care | Attending: Emergency Medicine | Admitting: Emergency Medicine

## 2023-05-30 DIAGNOSIS — I10 Essential (primary) hypertension: Secondary | ICD-10-CM | POA: Diagnosis not present

## 2023-05-30 DIAGNOSIS — R519 Headache, unspecified: Secondary | ICD-10-CM | POA: Diagnosis present

## 2023-05-30 DIAGNOSIS — Z79899 Other long term (current) drug therapy: Secondary | ICD-10-CM | POA: Insufficient documentation

## 2023-05-30 DIAGNOSIS — Z8673 Personal history of transient ischemic attack (TIA), and cerebral infarction without residual deficits: Secondary | ICD-10-CM | POA: Insufficient documentation

## 2023-05-30 DIAGNOSIS — E119 Type 2 diabetes mellitus without complications: Secondary | ICD-10-CM | POA: Diagnosis not present

## 2023-05-30 DIAGNOSIS — Z7984 Long term (current) use of oral hypoglycemic drugs: Secondary | ICD-10-CM | POA: Insufficient documentation

## 2023-05-30 NOTE — ED Triage Notes (Addendum)
 Ambulatory to triage.   Says she is having whole body pain and a headache.   Requesting another c-collar .

## 2023-05-31 ENCOUNTER — Encounter (HOSPITAL_COMMUNITY): Payer: Self-pay | Admitting: Emergency Medicine

## 2023-05-31 ENCOUNTER — Emergency Department (HOSPITAL_COMMUNITY)
Admission: EM | Admit: 2023-05-31 | Discharge: 2023-05-31 | Disposition: A | Discharge status: Home / Self Care | Attending: Emergency Medicine | Admitting: Emergency Medicine

## 2023-05-31 DIAGNOSIS — Y92811 Bus as the place of occurrence of the external cause: Secondary | ICD-10-CM | POA: Insufficient documentation

## 2023-05-31 DIAGNOSIS — Z79899 Other long term (current) drug therapy: Secondary | ICD-10-CM | POA: Insufficient documentation

## 2023-05-31 DIAGNOSIS — M79605 Pain in left leg: Secondary | ICD-10-CM | POA: Insufficient documentation

## 2023-05-31 DIAGNOSIS — I10 Essential (primary) hypertension: Secondary | ICD-10-CM | POA: Diagnosis not present

## 2023-05-31 DIAGNOSIS — G8929 Other chronic pain: Secondary | ICD-10-CM | POA: Insufficient documentation

## 2023-05-31 DIAGNOSIS — M545 Low back pain, unspecified: Secondary | ICD-10-CM | POA: Insufficient documentation

## 2023-05-31 DIAGNOSIS — W19XXXA Unspecified fall, initial encounter: Secondary | ICD-10-CM | POA: Insufficient documentation

## 2023-05-31 MED ORDER — ACETAMINOPHEN 500 MG PO TABS
1000.0000 mg | ORAL_TABLET | Freq: Once | ORAL | Status: AC
  Administered 2023-05-31: 1000 mg via ORAL
  Filled 2023-05-31: qty 2

## 2023-05-31 MED ORDER — ACETAMINOPHEN 500 MG PO TABS: 500.0000 mg | ORAL_TABLET | Freq: Four times a day (QID) | ORAL | 0 refills | Status: DC | PRN

## 2023-05-31 MED ORDER — AMLODIPINE BESYLATE 5 MG PO TABS
10.0000 mg | ORAL_TABLET | Freq: Once | ORAL | Status: AC
  Administered 2023-05-31: 10 mg via ORAL
  Filled 2023-05-31: qty 2

## 2023-05-31 MED ORDER — ACETAMINOPHEN 500 MG PO TABS
1000.0000 mg | ORAL_TABLET | ORAL | Status: AC
  Administered 2023-05-31: 1000 mg via ORAL
  Filled 2023-05-31: qty 2

## 2023-05-31 NOTE — Discharge Instructions (Signed)
 You were seen in the emergency department today for concerns of a headache.  Your blood pressure was notably elevated which we assume was likely due to a combination of missing a blood pressure dose of your amlodipine  and also your pain from your headache.  After you were given your blood pressure medication Tylenol , her blood pressure improved.  Please follow-up with a primary care provider once you are able to establish care.  Return to the emergency department only for concerns of new or worsening symptoms.
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
## 2023-05-31 NOTE — Discharge Instructions (Addendum)
 You were seen today for chronic pain.  I am prescribing Tylenol  for you to take regularly until seen by PCP.  Again it is important for you to consider trying to follow-up with a primary care physician as the emergency department does not have the resources to treat your chronic pain appropriately.  Recommend that you continue to take Tylenol  for pain relief.  Return for any new or worsening symptoms  Take Tylenol  (acetominophen)  650mg  every 4-6 hours, as needed for pain or fever. Do not take more than 4,000 mg in a 24-hour period. As this may cause liver damage. While this is rare, if you begin to develop yellowing of the skin or eyes, stop taking and return to ER immediately.
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
## 2023-05-31 NOTE — Discharge Planning (Signed)
 RNCM met with pt at bedside.  RNCM offered to make pt a PCP appointment as pt stated she was there to have her neck evaluated and to get lidocaine  patch.  Pt states she prefers to visit ED because she is familiar with the area.  RNCM offered [facility] across the street, but pt declined stating "they won't see me as much as I need to be seen."    Saketh Daubert J. Rachel Budds, RN, BSN, NCM  Transitions of Care  Nurse Case Manager  [facility] Emergency Departments  Operative Services  [phone]

## 2023-06-02 ENCOUNTER — Emergency Department (HOSPITAL_COMMUNITY)
Admission: EM | Admit: 2023-06-02 | Discharge: 2023-06-03 | Disposition: A | Discharge status: Home / Self Care | Source: Home / Self Care | Attending: Emergency Medicine | Admitting: Emergency Medicine

## 2023-06-02 ENCOUNTER — Encounter (HOSPITAL_COMMUNITY): Payer: Self-pay | Admitting: *Deleted

## 2023-06-02 ENCOUNTER — Other Ambulatory Visit: Payer: Self-pay

## 2023-06-02 ENCOUNTER — Emergency Department (HOSPITAL_COMMUNITY)
Admission: EM | Admit: 2023-06-02 | Discharge: 2023-06-02 | Disposition: A | Discharge status: Home / Self Care | Attending: Emergency Medicine | Admitting: Emergency Medicine

## 2023-06-02 DIAGNOSIS — H04129 Dry eye syndrome of unspecified lacrimal gland: Secondary | ICD-10-CM | POA: Insufficient documentation

## 2023-06-02 DIAGNOSIS — Z7984 Long term (current) use of oral hypoglycemic drugs: Secondary | ICD-10-CM | POA: Insufficient documentation

## 2023-06-02 DIAGNOSIS — M542 Cervicalgia: Secondary | ICD-10-CM | POA: Insufficient documentation

## 2023-06-02 DIAGNOSIS — G8929 Other chronic pain: Secondary | ICD-10-CM | POA: Insufficient documentation

## 2023-06-02 DIAGNOSIS — I10 Essential (primary) hypertension: Secondary | ICD-10-CM | POA: Diagnosis not present

## 2023-06-02 DIAGNOSIS — Z79899 Other long term (current) drug therapy: Secondary | ICD-10-CM | POA: Insufficient documentation

## 2023-06-02 DIAGNOSIS — M791 Myalgia, unspecified site: Secondary | ICD-10-CM | POA: Insufficient documentation

## 2023-06-02 DIAGNOSIS — E119 Type 2 diabetes mellitus without complications: Secondary | ICD-10-CM | POA: Insufficient documentation

## 2023-06-02 DIAGNOSIS — H547 Unspecified visual loss: Secondary | ICD-10-CM | POA: Insufficient documentation

## 2023-06-02 DIAGNOSIS — H04123 Dry eye syndrome of bilateral lacrimal glands: Secondary | ICD-10-CM

## 2023-06-02 MED ORDER — LIDOCAINE 5 % EX PTCH
3.0000 | MEDICATED_PATCH | CUTANEOUS | Status: DC
  Administered 2023-06-02: 3 via TRANSDERMAL
  Filled 2023-06-02: qty 3

## 2023-06-02 MED ORDER — LIDOCAINE 5 % EX PTCH: 1.0000 | MEDICATED_PATCH | CUTANEOUS | 0 refills | Status: DC

## 2023-06-02 MED ORDER — NAPROXEN 250 MG PO TABS
500.0000 mg | ORAL_TABLET | Freq: Once | ORAL | Status: AC
  Administered 2023-06-02: 500 mg via ORAL
  Filled 2023-06-02: qty 2
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
## 2023-06-02 NOTE — ED Triage Notes (Signed)
 Not in waiting when called just as soon as she checked in

## 2023-06-02 NOTE — Discharge Instructions (Addendum)
 Thank you for letting us  evaluate you today.  I have provided you with naproxen  and lidocaine  patches here Emergency Department for your pain.  I have sent a lidocaine  patches to your pharmacy to use for pain as needed.

## 2023-06-02 NOTE — ED Triage Notes (Signed)
 The pt  was seen here already and when   she was discharged she checked back in  her usual practice

## 2023-06-02 NOTE — ED Notes (Signed)
 Patient discharged in stable condition, education materials explained including, follow up, any prescriptions and reasons to return. Patient voiced agreement to education and discharge material.

## 2023-06-02 NOTE — ED Triage Notes (Signed)
 Body pain  again tonight  a regular

## 2023-06-03 ENCOUNTER — Emergency Department (HOSPITAL_COMMUNITY)
Admission: EM | Admit: 2023-06-03 | Discharge: 2023-06-03 | Disposition: A | Discharge status: Home / Self Care | Attending: Emergency Medicine | Admitting: Emergency Medicine

## 2023-06-03 ENCOUNTER — Encounter (HOSPITAL_COMMUNITY): Payer: Self-pay | Admitting: *Deleted

## 2023-06-03 ENCOUNTER — Encounter (HOSPITAL_COMMUNITY): Payer: Self-pay

## 2023-06-03 ENCOUNTER — Emergency Department (HOSPITAL_COMMUNITY)
Admission: EM | Admit: 2023-06-03 | Discharge: 2023-06-03 | Disposition: A | Discharge status: Home / Self Care | Source: Home / Self Care | Attending: Emergency Medicine | Admitting: Emergency Medicine

## 2023-06-03 ENCOUNTER — Other Ambulatory Visit: Payer: Self-pay

## 2023-06-03 DIAGNOSIS — Z5321 Procedure and treatment not carried out due to patient leaving prior to being seen by health care provider: Secondary | ICD-10-CM | POA: Diagnosis not present

## 2023-06-03 DIAGNOSIS — M791 Myalgia, unspecified site: Secondary | ICD-10-CM | POA: Diagnosis present

## 2023-06-03 DIAGNOSIS — I10 Essential (primary) hypertension: Secondary | ICD-10-CM | POA: Insufficient documentation

## 2023-06-03 DIAGNOSIS — Z79899 Other long term (current) drug therapy: Secondary | ICD-10-CM | POA: Insufficient documentation

## 2023-06-03 DIAGNOSIS — E119 Type 2 diabetes mellitus without complications: Secondary | ICD-10-CM | POA: Insufficient documentation

## 2023-06-03 DIAGNOSIS — Z8673 Personal history of transient ischemic attack (TIA), and cerebral infarction without residual deficits: Secondary | ICD-10-CM | POA: Insufficient documentation

## 2023-06-03 DIAGNOSIS — R52 Pain, unspecified: Secondary | ICD-10-CM

## 2023-06-03 DIAGNOSIS — Z7984 Long term (current) use of oral hypoglycemic drugs: Secondary | ICD-10-CM | POA: Insufficient documentation

## 2023-06-03 DIAGNOSIS — R519 Headache, unspecified: Secondary | ICD-10-CM | POA: Diagnosis present

## 2023-06-03 MED ORDER — ARTIFICIAL TEARS OPHTHALMIC OINT
1.0000 | TOPICAL_OINTMENT | Freq: Once | OPHTHALMIC | Status: AC
  Administered 2023-06-03: 1 via OPHTHALMIC
  Filled 2023-06-03: qty 3.5

## 2023-06-03 MED ORDER — POLYVINYL ALCOHOL 1.4 % OP SOLN
1.0000 [drp] | OPHTHALMIC | Status: DC | PRN
  Administered 2023-06-03: 1 [drp] via OPHTHALMIC
  Filled 2023-06-03: qty 15

## 2023-06-03 MED ORDER — ACETAMINOPHEN 500 MG PO TABS
1000.0000 mg | ORAL_TABLET | Freq: Once | ORAL | Status: AC
  Administered 2023-06-03: 1000 mg via ORAL
  Filled 2023-06-03: qty 2

## 2023-06-03 MED ORDER — AMLODIPINE BESYLATE 5 MG PO TABS
10.0000 mg | ORAL_TABLET | Freq: Once | ORAL | Status: AC
  Administered 2023-06-03: 10 mg via ORAL
  Filled 2023-06-03: qty 2

## 2023-06-03 NOTE — ED Notes (Signed)
 Discharge instructions reviewed.   Opportunity for questions and concerns provided.   Alert, oriented and escorted to waiting area via wheelchair.   Displays no signs of distress.

## 2023-06-03 NOTE — ED Provider Notes (Cosign Needed Addendum)
 [place] EMERGENCY DEPARTMENT AT [facility] Provider Note   CSN: [id number] Arrival date & time: 06/03/23  1645     History  Chief Complaint  Patient presents with   Headache   Body Pain     [name] is a 78 y.o. female well-known to this facility with complaints of full body aches.  Patient has been seen numerous times for this similar complaint.  Workup to this point has been unremarkable.  She is interested in Tylenol  today.  Denies any new points of pain today.  No cough or congestion.   Headache  Past Medical History:  Diagnosis Date   Diabetes mellitus without complication (HCC)    GERD 09/04/2006   Qualifier: Diagnosis of  By: Jenice Mitts     History of echocardiogram    a. 2D ECHO: 11/06/2013 EF 65%; no WMA. Mild TR. PA pk pressure 43 mm HG   Hypertension    Stroke (HCC)   \    Home Medications Prior to Admission medications   Medication Sig Start Date End Date Taking? Authorizing Provider  acetaminophen  (TYLENOL ) 500 MG tablet Take 1 tablet (500 mg total) by mouth every 6 (six) hours as needed. 05/31/23   Bauer, Collin S, PA-C  albuterol  (VENTOLIN  HFA) 108 (90 Base) MCG/ACT inhaler Inhale 1-2 puffs into the lungs every 6 (six) hours as needed for wheezing or shortness of breath. 04/22/20   Gregoria Leas, NP  amLODipine  (NORVASC ) 10 MG tablet Take 1 tablet (10 mg total) by mouth daily. 01/23/23   Roemhildt, Lorin T, PA-C  chlorthalidone  (HYGROTON ) 25 MG tablet TAKE 1 TABLET (25 MG TOTAL) BY MOUTH DAILY. 11/20/21 11/20/22  Paseda, Folashade R, FNP  diclofenac  Sodium (VOLTAREN ) 1 % GEL Apply 4 g topically 4 (four) times daily. 05/28/23   Smoot, Genevive Ket, PA-C  diphenhydrAMINE  (BENADRYL ) 25 MG tablet Take 1 tablet (25 mg total) by mouth every 6 (six) hours as needed for itching or allergies. 10/03/22   Debbra Fairy, PA-C  gabapentin  (NEURONTIN ) 100 MG capsule Take 1 capsule (100 mg total) by mouth 3 (three) times daily as needed. 04/30/23   Bero, Michael M, MD   hydrocortisone  cream 1 % Apply to affected area 2 times daily 10/22/22   Coretha Dew, PA-C  hydrOXYzine  (ATARAX ) 25 MG tablet Take 1 tablet (25 mg total) by mouth every 8 (eight) hours as needed. 11/23/22   Henderly, Britni A, PA-C  lidocaine  (LIDODERM ) 5 % Place 1 patch onto the skin daily. Remove & Discard patch within 12 hours or as directed by MD 04/16/23   Barrett, Jamie N, PA-C  lidocaine  (LIDODERM ) 5 % Place 1 patch onto the skin daily. Remove & Discard patch within 12 hours or as directed by MD 05/08/23   Tegeler, Marine Sia, MD  lidocaine  (LIDODERM ) 5 % Place 1 patch onto the skin daily. Remove & Discard patch within 12 hours or as directed by MD 06/02/23   Royann Cords, PA  loratadine  (CLARITIN ) 10 MG tablet Take 1 tablet (10 mg total) by mouth daily. 04/06/23   Barrett, Kandace Organ, PA-C  metFORMIN  (GLUCOPHAGE ) 500 MG tablet Take 1 tablet (500 mg total) by mouth 2 (two) times daily with a meal. 11/03/22 11/03/23  Janalee Mcmurray, PA-C  methocarbamol  (ROBAXIN ) 500 MG tablet Take 1 tablet (500 mg total) by mouth 2 (two) times daily. 03/19/23   Carin Charleston, MD  naproxen  (NAPROSYN ) 375 MG tablet Take 1 tablet (375 mg total) by  mouth 2 (two) times daily. 03/17/23   Henderly, Britni A, PA-C  pantoprazole  (PROTONIX ) 20 MG tablet Take 1 tablet (20 mg total) by mouth daily. 10/10/21   Sueellen Emery, MD  triamcinolone  cream (KENALOG ) 0.1 % Apply 1 Application topically 2 (two) times daily. Apply to rash 09/27/22   Ninetta Basket, MD      Allergies    Codeine, Ibuprofen , and Imdur [isosorbide nitrate]    Review of Systems   Review of Systems  Neurological:  Positive for headaches.    Physical Exam Updated Vital Signs BP 133/85 (BP Location: Left Arm)   Pulse 77   Temp 98 F (36.7 C)   Resp 18   SpO2 100%  Physical Exam Vitals and nursing note reviewed.  Constitutional:      General: She is not in acute distress.    Appearance: She is well-developed.  HENT:     Head:  Normocephalic and atraumatic.  Eyes:     Conjunctiva/sclera: Conjunctivae normal.  Pulmonary:     Effort: Pulmonary effort is normal. No respiratory distress.  Musculoskeletal:        General: No swelling.     Cervical back: Neck supple.  Skin:    General: Skin is warm and dry.     Capillary Refill: Capillary refill takes less than 2 seconds.  Neurological:     Mental Status: She is alert.  Psychiatric:        Mood and Affect: Mood normal.     ED Results / Procedures / Treatments   Labs (all labs ordered are listed, but only abnormal results are displayed) Labs Reviewed - No data to display  EKG None  Radiology No results found.  Procedures Procedures    Medications Ordered in ED Medications  acetaminophen  (TYLENOL ) tablet 1,000 mg (has no administration in time range)    ED Course/ Medical Decision Making/ A&P                                 Medical Decision Making Risk OTC drugs.   This patient presents to the ED with chief complaint(s) of bodyaches.  The complaint involves an extensive differential diagnosis and also carries with it a high risk of complications and morbidity.   Pertinent past medical history as listed in HPI   Additional history obtained: Records reviewed Care Everywhere/External Records  Assessment and management:   Hemodynamically stable, afebrile, nontoxic-appearing patient presenting with complaints of bodyaches.  States that these are ongoing and not new.  Not associated with cough, congestion, vomiting or diarrhea.  She has been seen 97 times in the past 6 months for this complaint.  Do not feel that any labs or imaging are indicated today.  She is requesting Tylenol .  Will give a dose at discharge.  Independent ECG interpretation:  none  Independent labs interpretation:  The following labs were independently interpreted:  none  Independent visualization and interpretation of imaging: I independently visualized the following  imaging with scope of interpretation limited to determining acute life threatening conditions related to emergency care: none    Consultations obtained:   none  Disposition:   Patient will be discharged home. The patient has been appropriately medically screened and/or stabilized in the ED. I have low suspicion for any other emergent medical condition which would require further screening, evaluation or treatment in the ED or require inpatient management. At time of discharge the patient is hemodynamically  stable and in no acute distress. I have discussed work-up results and diagnosis with patient and answered all questions. Patient is agreeable with discharge plan. We discussed strict return precautions for returning to the emergency department and they verbalized understanding.     Social Determinants of Health:   Patient's impaired access to primary care  increases the complexity of managing their presentation  This note was dictated with voice recognition software.  Despite best efforts at proofreading, errors may have occurred which can change the documentation meaning.          Final Clinical Impression(s) / ED Diagnoses Final diagnoses:  Body aches    Rx / DC Orders ED Discharge Orders     None         Felicie Horning, PA-C 06/03/23 1807    Felicie Horning, PA-C 06/03/23 Melissa Spring, MD 06/04/23 [phone]

## 2023-06-03 NOTE — Discharge Instructions (Signed)
 Follow up with PCP

## 2023-06-03 NOTE — ED Triage Notes (Signed)
 Pt came in via POV d/t HA & feeling "like butt" d/t body pain. A/Ox4, rates pain 10/10.

## 2023-06-03 NOTE — ED Triage Notes (Signed)
 Patient was just discharged from the ED was sitting in the lobby and said the security  was telling her she needed to leave and she now has a headache and her blood pressure is high.

## 2023-06-03 NOTE — ED Notes (Signed)
 Pt stated she going to try and walk up to the bus stop.

## 2023-06-03 NOTE — Discharge Instructions (Addendum)
 You were evaluated in the emergency room for body aches.   Please call the number on the sheet to schedule appointment with your primary care doctor.
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
## 2023-06-05 ENCOUNTER — Encounter (HOSPITAL_COMMUNITY): Payer: Self-pay

## 2023-06-05 ENCOUNTER — Other Ambulatory Visit: Payer: Self-pay

## 2023-06-05 ENCOUNTER — Emergency Department (HOSPITAL_COMMUNITY)
Admission: EM | Admit: 2023-06-05 | Discharge: 2023-06-05 | Disposition: A | Discharge status: Home / Self Care | Attending: Emergency Medicine | Admitting: Emergency Medicine

## 2023-06-05 DIAGNOSIS — I10 Essential (primary) hypertension: Secondary | ICD-10-CM | POA: Insufficient documentation

## 2023-06-05 DIAGNOSIS — Z79899 Other long term (current) drug therapy: Secondary | ICD-10-CM | POA: Insufficient documentation

## 2023-06-05 DIAGNOSIS — Z7984 Long term (current) use of oral hypoglycemic drugs: Secondary | ICD-10-CM | POA: Diagnosis not present

## 2023-06-05 DIAGNOSIS — E119 Type 2 diabetes mellitus without complications: Secondary | ICD-10-CM | POA: Insufficient documentation

## 2023-06-05 DIAGNOSIS — G8929 Other chronic pain: Secondary | ICD-10-CM | POA: Diagnosis not present

## 2023-06-05 DIAGNOSIS — M7918 Myalgia, other site: Secondary | ICD-10-CM | POA: Diagnosis present
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
## 2023-06-05 NOTE — ED Notes (Signed)
 Patient decontaminated for bed bugs. Patient reluctant to cooperate but showered, clothes were double bagged and facility notified to decon room where patient bathed. Patient provided clean clothes, shoes, and socks.

## 2023-06-12 ENCOUNTER — Emergency Department (HOSPITAL_COMMUNITY)
Admission: EM | Admit: 2023-06-12 | Discharge: 2023-06-12 | Disposition: A | Discharge status: Home / Self Care | Attending: Emergency Medicine | Admitting: Emergency Medicine

## 2023-06-12 ENCOUNTER — Encounter (HOSPITAL_COMMUNITY): Payer: Self-pay

## 2023-06-12 ENCOUNTER — Other Ambulatory Visit: Payer: Self-pay

## 2023-06-12 DIAGNOSIS — G8929 Other chronic pain: Secondary | ICD-10-CM | POA: Insufficient documentation

## 2023-06-12 DIAGNOSIS — M542 Cervicalgia: Secondary | ICD-10-CM | POA: Insufficient documentation

## 2023-06-12 DIAGNOSIS — Z79899 Other long term (current) drug therapy: Secondary | ICD-10-CM | POA: Insufficient documentation

## 2023-06-12 DIAGNOSIS — I10 Essential (primary) hypertension: Secondary | ICD-10-CM | POA: Insufficient documentation

## 2023-06-12 DIAGNOSIS — Z7984 Long term (current) use of oral hypoglycemic drugs: Secondary | ICD-10-CM | POA: Diagnosis not present

## 2023-06-12 DIAGNOSIS — E119 Type 2 diabetes mellitus without complications: Secondary | ICD-10-CM | POA: Insufficient documentation

## 2023-06-12 MED ORDER — ACETAMINOPHEN 325 MG PO TABS
650.0000 mg | ORAL_TABLET | Freq: Once | ORAL | Status: AC
Start: 2023-06-12 — End: 2023-06-12
  Administered 2023-06-12: 650 mg via ORAL
  Filled 2023-06-12: qty 2

## 2023-06-12 NOTE — ED Triage Notes (Addendum)
 Pt here for neck pain and arrives in c-collar with bed bugs. Pt would like eye drops to clear up eyes. C/O headache
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
## 2023-06-15 ENCOUNTER — Encounter (HOSPITAL_COMMUNITY): Payer: Self-pay

## 2023-06-15 ENCOUNTER — Emergency Department (HOSPITAL_COMMUNITY)
Admission: EM | Admit: 2023-06-15 | Discharge: 2023-06-15 | Discharge status: Against Medical Advice | Source: Home / Self Care

## 2023-06-15 ENCOUNTER — Other Ambulatory Visit: Payer: Self-pay

## 2023-06-15 ENCOUNTER — Emergency Department (HOSPITAL_COMMUNITY)
Admission: EM | Admit: 2023-06-15 | Discharge: 2023-06-15 | Disposition: A | Discharge status: Home / Self Care | Attending: Emergency Medicine | Admitting: Emergency Medicine

## 2023-06-15 DIAGNOSIS — M25511 Pain in right shoulder: Secondary | ICD-10-CM | POA: Insufficient documentation

## 2023-06-15 DIAGNOSIS — Z5321 Procedure and treatment not carried out due to patient leaving prior to being seen by health care provider: Secondary | ICD-10-CM | POA: Insufficient documentation

## 2023-06-15 DIAGNOSIS — M25531 Pain in right wrist: Secondary | ICD-10-CM | POA: Insufficient documentation

## 2023-06-15 DIAGNOSIS — M2550 Pain in unspecified joint: Secondary | ICD-10-CM | POA: Diagnosis present

## 2023-06-15 DIAGNOSIS — G8929 Other chronic pain: Secondary | ICD-10-CM | POA: Insufficient documentation

## 2023-06-15 MED ORDER — ACETAMINOPHEN 325 MG PO TABS
650.0000 mg | ORAL_TABLET | Freq: Once | ORAL | Status: AC
  Administered 2023-06-15: 650 mg via ORAL
  Filled 2023-06-15: qty 2

## 2023-06-15 NOTE — ED Triage Notes (Signed)
 Pt came in via POV d/t joint pain, A/Ox4 rates her pain 10/10 during triage.

## 2023-06-15 NOTE — Discharge Instructions (Addendum)
 Please follow-up with your primary care provider.  Please take your medications as prescribed and symptoms change or worsen please return to the ER.
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
## 2023-06-18 ENCOUNTER — Emergency Department (HOSPITAL_COMMUNITY)
Admission: EM | Admit: 2023-06-18 | Discharge: 2023-06-19 | Disposition: A | Discharge status: Home / Self Care | Attending: Emergency Medicine | Admitting: Emergency Medicine

## 2023-06-18 ENCOUNTER — Encounter (HOSPITAL_COMMUNITY): Payer: Self-pay

## 2023-06-18 ENCOUNTER — Other Ambulatory Visit: Payer: Self-pay

## 2023-06-18 ENCOUNTER — Emergency Department (HOSPITAL_COMMUNITY)

## 2023-06-18 DIAGNOSIS — Z8673 Personal history of transient ischemic attack (TIA), and cerebral infarction without residual deficits: Secondary | ICD-10-CM | POA: Insufficient documentation

## 2023-06-18 DIAGNOSIS — M542 Cervicalgia: Secondary | ICD-10-CM | POA: Insufficient documentation

## 2023-06-18 DIAGNOSIS — X58XXXA Exposure to other specified factors, initial encounter: Secondary | ICD-10-CM | POA: Insufficient documentation

## 2023-06-18 DIAGNOSIS — T730XXA Starvation, initial encounter: Secondary | ICD-10-CM | POA: Diagnosis not present

## 2023-06-18 DIAGNOSIS — G8929 Other chronic pain: Secondary | ICD-10-CM | POA: Diagnosis not present

## 2023-06-18 DIAGNOSIS — M791 Myalgia, unspecified site: Secondary | ICD-10-CM | POA: Insufficient documentation

## 2023-06-18 DIAGNOSIS — Z7984 Long term (current) use of oral hypoglycemic drugs: Secondary | ICD-10-CM | POA: Insufficient documentation

## 2023-06-18 DIAGNOSIS — E119 Type 2 diabetes mellitus without complications: Secondary | ICD-10-CM | POA: Insufficient documentation

## 2023-06-18 DIAGNOSIS — Z79899 Other long term (current) drug therapy: Secondary | ICD-10-CM | POA: Diagnosis not present

## 2023-06-18 DIAGNOSIS — I10 Essential (primary) hypertension: Secondary | ICD-10-CM | POA: Insufficient documentation

## 2023-06-18 DIAGNOSIS — R519 Headache, unspecified: Secondary | ICD-10-CM | POA: Diagnosis present

## 2023-06-18 DIAGNOSIS — Z59819 Housing instability, housed unspecified: Secondary | ICD-10-CM | POA: Diagnosis not present

## 2023-06-18 DIAGNOSIS — R1084 Generalized abdominal pain: Secondary | ICD-10-CM | POA: Diagnosis present

## 2023-06-18 LAB — BASIC METABOLIC PANEL WITH GFR
Anion gap: 8 (ref 5–15)
BUN: 9 mg/dL (ref 8–23)
CO2: 25 mmol/L (ref 22–32)
Calcium: 8.5 mg/dL — ABNORMAL LOW (ref 8.9–10.3)
Chloride: 104 mmol/L (ref 98–111)
Creatinine, Ser: 0.96 mg/dL (ref 0.44–1.00)
GFR, Estimated: >60 mL/min (ref >60)
Glucose, Bld: 153 mg/dL — ABNORMAL HIGH (ref 70–99)
Potassium: 3.9 mmol/L (ref 3.5–5.1)
Sodium: 137 mmol/L (ref 135–145)

## 2023-06-18 LAB — CBC
HCT: 34.8 % — ABNORMAL LOW (ref 36.0–46.0)
Hemoglobin: 11.1 g/dL — ABNORMAL LOW (ref 12.0–15.0)
MCH: 29.7 pg (ref 26.0–34.0)
MCHC: 31.9 g/dL (ref 30.0–36.0)
MCV: 93 fL (ref 80.0–100.0)
Platelets: 223 10*3/uL (ref 150–400)
RBC: 3.74 MIL/uL — ABNORMAL LOW (ref 3.87–5.11)
RDW: 13.3 % (ref 11.5–15.5)
WBC: 6.7 10*3/uL (ref 4.0–10.5)
nRBC: 0 % (ref 0.0–0.2)

## 2023-06-18 LAB — TROPONIN I (HIGH SENSITIVITY): Troponin I (High Sensitivity): 8 ng/L (ref <18)

## 2023-06-18 LAB — BRAIN NATRIURETIC PEPTIDE: B Natriuretic Peptide: 199.6 pg/mL — ABNORMAL HIGH (ref 0.0–100.0)

## 2023-06-18 MED ORDER — OXYCODONE-ACETAMINOPHEN 5-325 MG PO TABS
1.0000 | ORAL_TABLET | Freq: Once | ORAL | Status: AC
  Administered 2023-06-18: 1 via ORAL
  Filled 2023-06-18: qty 1

## 2023-06-18 MED ORDER — ACETAMINOPHEN 325 MG PO TABS
650.0000 mg | ORAL_TABLET | Freq: Once | ORAL | Status: AC
  Administered 2023-06-18: 650 mg via ORAL
  Filled 2023-06-18: qty 2

## 2023-06-18 MED ORDER — METOPROLOL SUCCINATE ER 25 MG PO TB24: 25.0000 mg | ORAL_TABLET | Freq: Every day | ORAL | 2 refills | Status: DC

## 2023-06-18 MED ORDER — METOPROLOL TARTRATE 25 MG PO TABS
25.0000 mg | ORAL_TABLET | Freq: Once | ORAL | Status: AC
  Administered 2023-06-18: 25 mg via ORAL
  Filled 2023-06-18: qty 1

## 2023-06-18 MED ORDER — AMLODIPINE BESYLATE 5 MG PO TABS
10.0000 mg | ORAL_TABLET | Freq: Every day | ORAL | Status: DC
  Administered 2023-06-18: 10 mg via ORAL
  Filled 2023-06-18: qty 2

## 2023-06-18 MED ORDER — ARTIFICIAL TEARS OPHTHALMIC OINT
1.0000 | TOPICAL_OINTMENT | Freq: Once | OPHTHALMIC | Status: AC
  Administered 2023-06-19: 1 via OPHTHALMIC
  Filled 2023-06-18: qty 3.5

## 2023-06-18 NOTE — Discharge Instructions (Signed)
 Your blood pressure was elevated today. It has been elevated on many of your prior ED visits as well. A prescription for a new blood pressure medication was sent to your pharmacy. Take this in addition to your amlodipine  daily. Return to the ER for any new or worsening symptoms of concern.
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
## 2023-06-18 NOTE — ED Provider Notes (Addendum)
 [place] EMERGENCY DEPARTMENT AT [facility] Provider Note   CSN: [id number] Arrival date & time: 06/18/23  1718     History  No chief complaint on file.   [name] is a 78 y.o. female.  HPI Patient presents for pain in head, neck, and body.  Medical history includes DM, HTN, CVA, GERD, arthritis.  She has frequent visits with similar complaints as today. She denies any symptoms other than her chronic pain.    Home Medications Prior to Admission medications   Medication Sig Start Date End Date Taking? Authorizing Provider  metoprolol  succinate (TOPROL -XL) 25 MG 24 hr tablet Take 1 tablet (25 mg total) by mouth daily. 06/18/23  Yes Iva Mariner, MD  acetaminophen  (TYLENOL ) 500 MG tablet Take 1 tablet (500 mg total) by mouth every 6 (six) hours as needed. 05/31/23   Bauer, Collin S, PA-C  albuterol  (VENTOLIN  HFA) 108 (90 Base) MCG/ACT inhaler Inhale 1-2 puffs into the lungs every 6 (six) hours as needed for wheezing or shortness of breath. 04/22/20   Gregoria Leas, NP  amLODipine  (NORVASC ) 10 MG tablet Take 1 tablet (10 mg total) by mouth daily. 01/23/23   Roemhildt, Lorin T, PA-C  chlorthalidone  (HYGROTON ) 25 MG tablet TAKE 1 TABLET (25 MG TOTAL) BY MOUTH DAILY. 11/20/21 11/20/22  Paseda, Folashade R, FNP  diclofenac  Sodium (VOLTAREN ) 1 % GEL Apply 4 g topically 4 (four) times daily. 05/28/23   Smoot, Genevive Ket, PA-C  diphenhydrAMINE  (BENADRYL ) 25 MG tablet Take 1 tablet (25 mg total) by mouth every 6 (six) hours as needed for itching or allergies. 10/03/22   Debbra Fairy, PA-C  gabapentin  (NEURONTIN ) 100 MG capsule Take 1 capsule (100 mg total) by mouth 3 (three) times daily as needed. 04/30/23   Bero, Michael M, MD  hydrocortisone  cream 1 % Apply to affected area 2 times daily 10/22/22   Coretha Dew, PA-C  hydrOXYzine  (ATARAX ) 25 MG tablet Take 1 tablet (25 mg total) by mouth every 8 (eight) hours as needed. 11/23/22   Henderly, Britni A, PA-C  lidocaine  (LIDODERM ) 5 % Place 1  patch onto the skin daily. Remove & Discard patch within 12 hours or as directed by MD 04/16/23   Barrett, Jamie N, PA-C  lidocaine  (LIDODERM ) 5 % Place 1 patch onto the skin daily. Remove & Discard patch within 12 hours or as directed by MD 05/08/23   Tegeler, Marine Sia, MD  lidocaine  (LIDODERM ) 5 % Place 1 patch onto the skin daily. Remove & Discard patch within 12 hours or as directed by MD 06/02/23   Royann Cords, PA  loratadine  (CLARITIN ) 10 MG tablet Take 1 tablet (10 mg total) by mouth daily. 04/06/23   Barrett, Kandace Organ, PA-C  metFORMIN  (GLUCOPHAGE ) 500 MG tablet Take 1 tablet (500 mg total) by mouth 2 (two) times daily with a meal. 11/03/22 11/03/23  Janalee Mcmurray, PA-C  methocarbamol  (ROBAXIN ) 500 MG tablet Take 1 tablet (500 mg total) by mouth 2 (two) times daily. 03/19/23   Carin Charleston, MD  naproxen  (NAPROSYN ) 375 MG tablet Take 1 tablet (375 mg total) by mouth 2 (two) times daily. 03/17/23   Henderly, Britni A, PA-C  pantoprazole  (PROTONIX ) 20 MG tablet Take 1 tablet (20 mg total) by mouth daily. 10/10/21   Sueellen Emery, MD  triamcinolone  cream (KENALOG ) 0.1 % Apply 1 Application topically 2 (two) times daily. Apply to rash 09/27/22   Ninetta Basket, MD      Allergies  Codeine, Ibuprofen , and Imdur [isosorbide nitrate]    Review of Systems   Review of Systems  Musculoskeletal:  Positive for arthralgias and neck pain.  Neurological:  Positive for headaches.    Physical Exam Updated Vital Signs BP (!) 208/81   Pulse 66   Temp 98.1 F (36.7 C)   Resp 18   SpO2 100%  Physical Exam Vitals and nursing note reviewed.  Constitutional:      General: She is not in acute distress.    Appearance: Normal appearance. She is well-developed. She is not ill-appearing, toxic-appearing or diaphoretic.  HENT:     Head: Normocephalic and atraumatic.     Right Ear: External ear normal.     Left Ear: External ear normal.     Nose: Nose normal.     Mouth/Throat:     Mouth:  Mucous membranes are moist.  Eyes:     Extraocular Movements: Extraocular movements intact.     Conjunctiva/sclera: Conjunctivae normal.  Neck:     Comments: Wearing foam brace Cardiovascular:     Rate and Rhythm: Normal rate and regular rhythm.     Heart sounds: No murmur heard. Pulmonary:     Effort: Pulmonary effort is normal. No respiratory distress.     Breath sounds: Normal breath sounds. No wheezing or rales.  Chest:     Chest wall: No tenderness.  Abdominal:     General: There is no distension.     Palpations: Abdomen is soft.     Tenderness: There is no abdominal tenderness.  Musculoskeletal:        General: No swelling or deformity. Normal range of motion.     Cervical back: Neck supple.  Skin:    General: Skin is warm and dry.     Coloration: Skin is not jaundiced or pale.  Neurological:     General: No focal deficit present.     Mental Status: She is alert and oriented to person, place, and time.     Cranial Nerves: No cranial nerve deficit.     Sensory: No sensory deficit.     Motor: No weakness.     Coordination: Coordination normal.  Psychiatric:        Mood and Affect: Mood normal.        Behavior: Behavior normal.     ED Results / Procedures / Treatments   Labs (all labs ordered are listed, but only abnormal results are displayed) Labs Reviewed  BRAIN NATRIURETIC PEPTIDE - Abnormal; Notable for the following components:      Result Value   B Natriuretic Peptide 199.6 (*)    All other components within normal limits  CBC - Abnormal; Notable for the following components:   RBC 3.74 (*)    Hemoglobin 11.1 (*)    HCT 34.8 (*)    All other components within normal limits  BASIC METABOLIC PANEL WITH GFR - Abnormal; Notable for the following components:   Glucose, Bld 153 (*)    Calcium  8.5 (*)    All other components within normal limits  TROPONIN I (HIGH SENSITIVITY)    EKG None  Radiology CT Head Wo Contrast Result Date: 06/18/2023 CLINICAL  DATA:  Headache hypertension EXAM: CT HEAD WITHOUT CONTRAST TECHNIQUE: Contiguous axial images were obtained from the base of the skull through the vertex without intravenous contrast. RADIATION DOSE REDUCTION: This exam was performed according to the departmental dose-optimization program which includes automated exposure control, adjustment of the mA and/or kV according to patient  size and/or use of iterative reconstruction technique. COMPARISON:  CT brain 07/30/2022 FINDINGS: Brain: No acute territorial infarction, hemorrhage or intracranial mass. Atrophy and mild chronic small vessel ischemic changes of the white matter. The ventricles are nonenlarged Vascular: No hyperdense vessels.  Carotid vascular calcification Skull: Normal. Negative for fracture or focal lesion. Sinuses/Orbits: No acute finding. Other: None IMPRESSION: 1. No CT evidence for acute intracranial abnormality. 2. Atrophy and mild chronic small vessel ischemic changes of the white matter. Electronically Signed   By: Esmeralda Hedge M.D.   On: 06/18/2023 21:00   DG Chest 2 View Result Date: 06/18/2023 CLINICAL DATA:  Hypertension EXAM: CHEST - 2 VIEW COMPARISON:  08/18/1998 FINDINGS: The heart size and mediastinal contours are within normal limits. Aortic atherosclerosis. Hardware in the cervical spine. Both lungs are clear. The visualized skeletal structures are unremarkable. IMPRESSION: No active cardiopulmonary disease. Electronically Signed   By: Esmeralda Hedge M.D.   On: 06/18/2023 20:09    Procedures Procedures    Medications Ordered in ED Medications  amLODipine  (NORVASC ) tablet 10 mg (10 mg Oral Given 06/18/23 1937)  metoprolol  tartrate (LOPRESSOR ) tablet 25 mg (has no administration in time range)  acetaminophen  (TYLENOL ) tablet 650 mg (650 mg Oral Given 06/18/23 1937)  oxyCODONE -acetaminophen  (PERCOCET/ROXICET) 5-325 MG per tablet 1 tablet (1 tablet Oral Given 06/18/23 2015)    ED Course/ Medical Decision Making/ A&P                                  Medical Decision Making Amount and/or Complexity of Data Reviewed Labs: ordered. Radiology: ordered.  Risk OTC drugs. Prescription drug management.   Patient presents for chronic pain to head, neck and body. She is well known to the emergency department with frequent visits for the same complaints. She denies any new complaints. Vital signs on arrival are notable for hypertension in the range of 230s/80s. Her home dose of amlodipine  was ordered in addition to pain control. Labwork is notable for moderate elevation in BNP. There are no comparisons available in the past 2 years. She has no shortness of breath. Chest x-ray is unremarkable, as is remaining labwork. CT of head showed no acute findings. On reassessment, patient is resting comfortably. BP improved to 200s/80s. This is consistent with most of her recent ED visits. She would likely benefit from additional blood pressure medication. Metoprolol  was ordered and prescribed. She was discharged in stable condition.        Final Clinical Impression(s) / ED Diagnoses Final diagnoses:  Other chronic pain  Hypertension, unspecified type    Rx / DC Orders ED Discharge Orders          Ordered    metoprolol  succinate (TOPROL -XL) 25 MG 24 hr tablet  Daily        06/18/23 2318              Iva Mariner, MD 06/18/23 2320    Iva Mariner, MD 06/18/23 2320

## 2023-06-19 ENCOUNTER — Emergency Department (HOSPITAL_COMMUNITY)
Admission: EM | Admit: 2023-06-19 | Discharge: 2023-06-19 | Disposition: A | Discharge status: Home / Self Care | Attending: Student | Admitting: Student

## 2023-06-19 ENCOUNTER — Other Ambulatory Visit: Payer: Self-pay

## 2023-06-19 ENCOUNTER — Encounter (HOSPITAL_COMMUNITY): Payer: Self-pay

## 2023-06-19 DIAGNOSIS — R519 Headache, unspecified: Secondary | ICD-10-CM | POA: Diagnosis not present

## 2023-06-19 DIAGNOSIS — Z59819 Housing instability, housed unspecified: Secondary | ICD-10-CM | POA: Insufficient documentation

## 2023-06-19 DIAGNOSIS — T730XXA Starvation, initial encounter: Secondary | ICD-10-CM | POA: Insufficient documentation

## 2023-06-19 DIAGNOSIS — Z7984 Long term (current) use of oral hypoglycemic drugs: Secondary | ICD-10-CM | POA: Insufficient documentation

## 2023-06-19 DIAGNOSIS — E119 Type 2 diabetes mellitus without complications: Secondary | ICD-10-CM | POA: Insufficient documentation

## 2023-06-19 DIAGNOSIS — I10 Essential (primary) hypertension: Secondary | ICD-10-CM | POA: Insufficient documentation

## 2023-06-19 DIAGNOSIS — Z79899 Other long term (current) drug therapy: Secondary | ICD-10-CM | POA: Insufficient documentation

## 2023-06-19 NOTE — ED Triage Notes (Signed)
 Pt reports all over generalized body aches and pains since falling months ago. She was just seen and discharged from this hospital one hour ago with the same complaint.
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
## 2023-06-20 ENCOUNTER — Other Ambulatory Visit: Payer: Self-pay

## 2023-06-20 ENCOUNTER — Emergency Department (HOSPITAL_COMMUNITY)

## 2023-06-20 ENCOUNTER — Telehealth: Payer: Self-pay | Admitting: *Deleted

## 2023-06-20 ENCOUNTER — Emergency Department (HOSPITAL_COMMUNITY)
Admission: EM | Admit: 2023-06-20 | Discharge: 2023-06-23 | Disposition: A | Discharge status: Home / Self Care | Attending: Emergency Medicine | Admitting: Emergency Medicine

## 2023-06-20 ENCOUNTER — Encounter (HOSPITAL_COMMUNITY): Payer: Self-pay

## 2023-06-20 DIAGNOSIS — Z5900 Homelessness unspecified: Secondary | ICD-10-CM | POA: Diagnosis not present

## 2023-06-20 DIAGNOSIS — E119 Type 2 diabetes mellitus without complications: Secondary | ICD-10-CM | POA: Insufficient documentation

## 2023-06-20 DIAGNOSIS — R52 Pain, unspecified: Secondary | ICD-10-CM | POA: Insufficient documentation

## 2023-06-20 DIAGNOSIS — I10 Essential (primary) hypertension: Secondary | ICD-10-CM | POA: Diagnosis not present

## 2023-06-20 DIAGNOSIS — Z7984 Long term (current) use of oral hypoglycemic drugs: Secondary | ICD-10-CM | POA: Insufficient documentation

## 2023-06-20 DIAGNOSIS — Z79899 Other long term (current) drug therapy: Secondary | ICD-10-CM | POA: Diagnosis not present

## 2023-06-20 DIAGNOSIS — H538 Other visual disturbances: Secondary | ICD-10-CM | POA: Diagnosis not present

## 2023-06-20 DIAGNOSIS — R41841 Cognitive communication deficit: Secondary | ICD-10-CM | POA: Diagnosis not present

## 2023-06-20 DIAGNOSIS — Z8673 Personal history of transient ischemic attack (TIA), and cerebral infarction without residual deficits: Secondary | ICD-10-CM | POA: Diagnosis not present

## 2023-06-20 DIAGNOSIS — R4189 Other symptoms and signs involving cognitive functions and awareness: Secondary | ICD-10-CM | POA: Diagnosis present

## 2023-06-20 DIAGNOSIS — Z659 Problem related to unspecified psychosocial circumstances: Secondary | ICD-10-CM

## 2023-06-20 DIAGNOSIS — R4182 Altered mental status, unspecified: Secondary | ICD-10-CM | POA: Diagnosis present

## 2023-06-20 DIAGNOSIS — G3184 Mild cognitive impairment, so stated: Secondary | ICD-10-CM | POA: Diagnosis not present

## 2023-06-20 LAB — URINALYSIS, ROUTINE W REFLEX MICROSCOPIC
Bilirubin Urine: NEGATIVE
Glucose, UA: NEGATIVE mg/dL
Hgb urine dipstick: NEGATIVE
Ketones, ur: NEGATIVE mg/dL
Nitrite: NEGATIVE
Protein, ur: NEGATIVE mg/dL
Specific Gravity, Urine: 1.009 (ref 1.005–1.030)
pH: 5 (ref 5.0–8.0)

## 2023-06-20 LAB — CBC WITH DIFFERENTIAL/PLATELET
Abs Immature Granulocytes: 0.01 10*3/uL (ref 0.00–0.07)
Basophils Absolute: 0 10*3/uL (ref 0.0–0.1)
Basophils Relative: 1 %
Eosinophils Absolute: 0.1 10*3/uL (ref 0.0–0.5)
Eosinophils Relative: 2 %
HCT: 34 % — ABNORMAL LOW (ref 36.0–46.0)
Hemoglobin: 10.8 g/dL — ABNORMAL LOW (ref 12.0–15.0)
Immature Granulocytes: 0 %
Lymphocytes Relative: 28 %
Lymphs Abs: 1.7 10*3/uL (ref 0.7–4.0)
MCH: 30 pg (ref 26.0–34.0)
MCHC: 31.8 g/dL (ref 30.0–36.0)
MCV: 94.4 fL (ref 80.0–100.0)
Monocytes Absolute: 0.5 10*3/uL (ref 0.1–1.0)
Monocytes Relative: 8 %
Neutro Abs: 3.7 10*3/uL (ref 1.7–7.7)
Neutrophils Relative %: 61 %
Platelets: 236 10*3/uL (ref 150–400)
RBC: 3.6 MIL/uL — ABNORMAL LOW (ref 3.87–5.11)
RDW: 13.2 % (ref 11.5–15.5)
WBC: 6 10*3/uL (ref 4.0–10.5)
nRBC: 0 % (ref 0.0–0.2)

## 2023-06-20 LAB — COMPREHENSIVE METABOLIC PANEL WITH GFR
ALT: 14 U/L (ref 0–44)
AST: 24 U/L (ref 15–41)
Albumin: 3.8 g/dL (ref 3.5–5.0)
Alkaline Phosphatase: 62 U/L (ref 38–126)
Anion gap: 16 — ABNORMAL HIGH (ref 5–15)
BUN: 10 mg/dL (ref 8–23)
CO2: 23 mmol/L (ref 22–32)
Calcium: 9.2 mg/dL (ref 8.9–10.3)
Chloride: 100 mmol/L (ref 98–111)
Creatinine, Ser: 1 mg/dL (ref 0.44–1.00)
GFR, Estimated: 58 mL/min — ABNORMAL LOW (ref >60)
Glucose, Bld: 102 mg/dL — ABNORMAL HIGH (ref 70–99)
Potassium: 3.8 mmol/L (ref 3.5–5.1)
Sodium: 139 mmol/L (ref 135–145)
Total Bilirubin: 0.3 mg/dL (ref 0.0–1.2)
Total Protein: 7.3 g/dL (ref 6.5–8.1)

## 2023-06-20 LAB — VITAMIN B12: Vitamin B-12: 579 pg/mL (ref 180–914)

## 2023-06-20 LAB — TSH: TSH: 1.383 u[IU]/mL (ref 0.350–4.500)

## 2023-06-20 MED ORDER — HYDROXYZINE HCL 25 MG PO TABS: 25.0000 mg | ORAL_TABLET | Freq: Three times a day (TID) | ORAL | Status: DC | PRN

## 2023-06-20 MED ORDER — METFORMIN HCL 500 MG PO TABS
500.0000 mg | ORAL_TABLET | Freq: Two times a day (BID) | ORAL | Status: DC
  Administered 2023-06-21 – 2023-06-23 (×5): 500 mg via ORAL
  Filled 2023-06-20 (×5): qty 1

## 2023-06-20 MED ORDER — AMLODIPINE BESYLATE 5 MG PO TABS
10.0000 mg | ORAL_TABLET | Freq: Once | ORAL | Status: AC
  Administered 2023-06-20: 10 mg via ORAL
  Filled 2023-06-20: qty 2

## 2023-06-20 MED ORDER — ACETAMINOPHEN 500 MG PO TABS
1000.0000 mg | ORAL_TABLET | Freq: Once | ORAL | Status: AC
  Administered 2023-06-20: 1000 mg via ORAL
  Filled 2023-06-20: qty 2

## 2023-06-20 MED ORDER — PANTOPRAZOLE SODIUM 20 MG PO TBEC
20.0000 mg | DELAYED_RELEASE_TABLET | Freq: Every day | ORAL | Status: DC
  Administered 2023-06-21 – 2023-06-23 (×3): 20 mg via ORAL
  Filled 2023-06-20 (×3): qty 1
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
## 2023-06-20 NOTE — ED Provider Notes (Incomplete)
  Provider Note MRN:  [id number]  Arrival date & time: 06/21/23    ED Course and Medical Decision Making  Assumed care of patient at sign-out or upon transfer.  High ED utilizer with similar vague complaints to prior visits, also here for some type of mental capacity evaluation.  There was an initial plan for MRI however this is not feasible due to metallic foreign body in the hair that we cannot locate.  CT head should be adequate and we will perform mental status exam.  2 AM update: Patient is well-appearing with no complaints on my assessment.  Workup reassuring with nonacute CT head.  Patient is oriented to name, place, birthday, events, does not quite get the year correct.  Plan is for [facility] consultation for possible placement.  Signed out to default provider.  Procedures  Final Clinical Impressions(s) / ED Diagnoses     ICD-10-CM   1. Body aches  R52     2. Problem related to unspecified psychosocial circumstances  Z65.9       ED Discharge Orders     None         Discharge Instructions      You were evaluated in the Emergency Department and after careful evaluation, we did not find any emergent condition requiring admission or further testing in the hospital.  Your exam/testing today is overall reassuring.  Please return to the Emergency Department if you experience any worsening of your condition.   Thank you for allowing us  to be a part of your care.    Merrick Abe. Harless Lien, MD [facility] Emergency Medicine [facility] [email or web address]    Edson Graces, MD 06/21/23 Burnie Cartwright    Edson Graces, MD 06/21/23 [id number]

## 2023-06-20 NOTE — ED Notes (Signed)
 Explaining to patient we are going to do blood work, MRI and urine and patient states "aint nothing wrong with me".  Reports she only came bc her hands hurt.  Reports they did all those test last time.  Patient needed assistance walking back to room bc patient keeps reporting she can't see well. n

## 2023-06-20 NOTE — ED Triage Notes (Signed)
 Patient comes in with same complaints as always with body aches.  Patient care plan has been developed and will do testing per Dr Rays request. No visible bed bugs noted at this time but when we undress patient will look further.  Patient is only oriented to self and place and unable to tell RN date and time.
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
## 2023-06-20 NOTE — ED Provider Notes (Signed)
 [place] EMERGENCY DEPARTMENT AT [facility] Provider Note   CSN: [id number] Arrival date & time: 06/20/23  1745     History  Chief Complaint  Patient presents with   Medical Clearance    [name] is a 78 y.o. female with PMH as listed below who presents with body aches. She states she hurts all over. She also endorses blurry vision and states that the previous eyedrops she was given don't work and she wants stronger eye drops. She denies any headache, falls, head trauma, eye pain.     Past Medical History:  Diagnosis Date   Diabetes mellitus without complication (HCC)    GERD 09/04/2006   Qualifier: Diagnosis of  By: Jenice Mitts     History of echocardiogram    a. 2D ECHO: 11/06/2013 EF 65%; no WMA. Mild TR. PA pk pressure 43 mm HG   Hypertension    Stroke [facility])        Home Medications Prior to Admission medications   Medication Sig Start Date End Date Taking? Authorizing Provider  acetaminophen  (TYLENOL ) 500 MG tablet Take 1 tablet (500 mg total) by mouth every 6 (six) hours as needed. 05/31/23   Bauer, Collin S, PA-C  albuterol  (VENTOLIN  HFA) 108 (90 Base) MCG/ACT inhaler Inhale 1-2 puffs into the lungs every 6 (six) hours as needed for wheezing or shortness of breath. 04/22/20   Gregoria Leas, NP  amLODipine  (NORVASC ) 10 MG tablet Take 1 tablet (10 mg total) by mouth daily. 01/23/23   Roemhildt, Lorin T, PA-C  chlorthalidone  (HYGROTON ) 25 MG tablet TAKE 1 TABLET (25 MG TOTAL) BY MOUTH DAILY. 11/20/21 11/20/22  Paseda, Folashade R, FNP  diclofenac  Sodium (VOLTAREN ) 1 % GEL Apply 4 g topically 4 (four) times daily. 05/28/23   Smoot, Genevive Ket, PA-C  diphenhydrAMINE  (BENADRYL ) 25 MG tablet Take 1 tablet (25 mg total) by mouth every 6 (six) hours as needed for itching or allergies. 10/03/22   Debbra Fairy, PA-C  gabapentin  (NEURONTIN ) 100 MG capsule Take 1 capsule (100 mg total) by mouth 3 (three) times daily as needed. 04/30/23   Bero, Michael M, MD  hydrocortisone  cream  1 % Apply to affected area 2 times daily 10/22/22   Coretha Dew, PA-C  hydrOXYzine  (ATARAX ) 25 MG tablet Take 1 tablet (25 mg total) by mouth every 8 (eight) hours as needed. 11/23/22   Henderly, Britni A, PA-C  lidocaine  (LIDODERM ) 5 % Place 1 patch onto the skin daily. Remove & Discard patch within 12 hours or as directed by MD 04/16/23   Barrett, Jamie N, PA-C  lidocaine  (LIDODERM ) 5 % Place 1 patch onto the skin daily. Remove & Discard patch within 12 hours or as directed by MD 05/08/23   Tegeler, Marine Sia, MD  lidocaine  (LIDODERM ) 5 % Place 1 patch onto the skin daily. Remove & Discard patch within 12 hours or as directed by MD 06/02/23   Royann Cords, PA  loratadine  (CLARITIN ) 10 MG tablet Take 1 tablet (10 mg total) by mouth daily. 04/06/23   Barrett, Jamie N, PA-C  metFORMIN  (GLUCOPHAGE ) 500 MG tablet Take 1 tablet (500 mg total) by mouth 2 (two) times daily with a meal. 11/03/22 11/03/23  Janalee Mcmurray, PA-C  methocarbamol  (ROBAXIN ) 500 MG tablet Take 1 tablet (500 mg total) by mouth 2 (two) times daily. 03/19/23   Carin Charleston, MD  metoprolol  succinate (TOPROL -XL) 25 MG 24 hr tablet Take 1 tablet (25 mg total) by mouth daily.  06/18/23   Iva Mariner, MD  naproxen  (NAPROSYN ) 375 MG tablet Take 1 tablet (375 mg total) by mouth 2 (two) times daily. 03/17/23   Henderly, Britni A, PA-C  pantoprazole  (PROTONIX ) 20 MG tablet Take 1 tablet (20 mg total) by mouth daily. 10/10/21   Sueellen Emery, MD  triamcinolone  cream (KENALOG ) 0.1 % Apply 1 Application topically 2 (two) times daily. Apply to rash 09/27/22   Ninetta Basket, MD      Allergies    Codeine, Ibuprofen , and Imdur [isosorbide nitrate]    Review of Systems   Review of Systems A 10 point review of systems was performed and is negative unless otherwise reported in HPI.  Physical Exam Updated Vital Signs BP (!) 209/63 (BP Location: Left Arm)   Pulse (!) 56   Temp 98.2 F (36.8 C)   Resp 15   Ht 5\' 4"  (1.626 m)   Wt 48.5  kg   SpO2 100%   BMI 18.37 kg/m  Physical Exam General: Normal appearing female, lying in bed.  HEENT: PERRLA, Sclera anicteric, MMM, trachea midline.  Cardiology: RRR, no murmurs/rubs/gallops.   Resp: Normal respiratory rate and effort. CTAB, no wheezes, rhonchi, crackles.  Abd: Soft, non-tender, non-distended. No rebound tenderness or guarding.  GU: Deferred. MSK: No peripheral edema or signs of trauma.  Skin: warm, dry. Neuro: A&Ox4, CNs II-XII grossly intact. MAEs. Sensation grossly intact.   ED Results / Procedures / Treatments   Labs (all labs ordered are listed, but only abnormal results are displayed) Labs Reviewed  CBC WITH DIFFERENTIAL/PLATELET - Abnormal; Notable for the following components:      Result Value   RBC 3.60 (*)    Hemoglobin 10.8 (*)    HCT 34.0 (*)    All other components within normal limits  COMPREHENSIVE METABOLIC PANEL WITH GFR - Abnormal; Notable for the following components:   Glucose, Bld 102 (*)    GFR, Estimated 58 (*)    Anion gap 16 (*)    All other components within normal limits  URINALYSIS, ROUTINE W REFLEX MICROSCOPIC - Abnormal; Notable for the following components:   APPearance HAZY (*)    Leukocytes,Ua SMALL (*)    Bacteria, UA RARE (*)    All other components within normal limits  TSH  VITAMIN B12  RPR    EKG None  Radiology No results found.  Procedures Procedures    Medications Ordered in ED Medications  amLODipine  (NORVASC ) tablet 10 mg (has no administration in time range)  hydrOXYzine  (ATARAX ) tablet 25 mg (has no administration in time range)  metFORMIN  (GLUCOPHAGE ) tablet 500 mg (has no administration in time range)  pantoprazole  (PROTONIX ) EC tablet 20 mg (has no administration in time range)  acetaminophen  (TYLENOL ) tablet 1,000 mg (1,000 mg Oral Given 06/20/23 2210)    ED Course/ Medical Decision Making/ A&P                          Medical Decision Making Risk OTC drugs. Prescription drug  management.    This patient presents to the ED for concern of body aches, this involves an extensive number of treatment options, and is a complaint that carries with it a high risk of complications and morbidity.   MDM:    Patient is high ED utilizer for various complaints. She has no e/o any emergent etiology of her sxs today.   Reportedly Dr. Synetta Eves ordered MRI for evaluation of mental capacity. Patient was sent  to MRI per Dr. Synetta Eves and had metallic object in her hair, sent back. MRI cannot safely be done d/t metallic object. I attempted to find/remove metallic object in hair but her hair severely matted, cannot find/locate such object, patient doesn't know where or what it is either. Will CTH instead. She is nonfocal, there are no signs of acute stroke on physical exam, do not believe we need MRI at this time to r/o stroke.    Dr Synetta Eves also wanted in person MME and capacity assessment by psychiatry. She has labs inc CBC, CMP, UA, vit b12, and TSH that are unremarkable in context of presentation. She is HTNsive to 200 systolic likely d/t noncompliance w/ antihypertensives and ordered home meds here.      Labs: Labs Ordered from triage, and personally interpreted labs.  The pertinent results include:  those listed above  Additional history obtained from chart review.  Reevaluation: After the interventions noted above, I reevaluated the patient and found that they have :stayed the same  Social Determinants of Health: Homeless  Disposition:  Patient is signed out to the oncoming ED physician Dr. Harless Lien who is made aware of her history, presentation, exam, workup, and plan.    Co morbidities that complicate the patient evaluation  Past Medical History:  Diagnosis Date   Diabetes mellitus without complication (HCC)    GERD 09/04/2006   Qualifier: Diagnosis of  By: Jenice Mitts     History of echocardiogram    a. 2D ECHO: 11/06/2013 EF 65%; no WMA. Mild TR. PA pk pressure 43 mm HG    Hypertension    Stroke (HCC)      Medicines Meds ordered this encounter  Medications   acetaminophen  (TYLENOL ) tablet 1,000 mg   amLODipine  (NORVASC ) tablet 10 mg   hydrOXYzine  (ATARAX ) tablet 25 mg   metFORMIN  (GLUCOPHAGE ) tablet 500 mg   pantoprazole  (PROTONIX ) EC tablet 20 mg    I have reviewed the patients home medicines and have made adjustments as needed  Problem List / ED Course: Problem List Items Addressed This Visit   None Visit Diagnoses       Body aches    -  Primary     Problem related to unspecified psychosocial circumstances                       This note was created using dictation software, which may contain spelling or grammatical errors.    Merdis Stalling, MD 06/20/23 2250

## 2023-06-20 NOTE — Telephone Encounter (Signed)
 RNCM received call from APS worker, Tarry Farmer [phone] regarding pt.  RNCM gave information regarding to pt health, hygiene and number of ED visits.  Loreda Rodriguez states she will make a visit with pt today and update me when completed.

## 2023-06-21 ENCOUNTER — Encounter (HOSPITAL_COMMUNITY): Payer: Self-pay | Admitting: Psychiatry

## 2023-06-21 ENCOUNTER — Emergency Department (HOSPITAL_COMMUNITY)

## 2023-06-21 DIAGNOSIS — G3184 Mild cognitive impairment, so stated: Secondary | ICD-10-CM

## 2023-06-21 DIAGNOSIS — R4189 Other symptoms and signs involving cognitive functions and awareness: Secondary | ICD-10-CM | POA: Diagnosis not present

## 2023-06-21 DIAGNOSIS — R52 Pain, unspecified: Secondary | ICD-10-CM | POA: Diagnosis not present

## 2023-06-21 LAB — CBG MONITORING, ED: Glucose-Capillary: 109 mg/dL — ABNORMAL HIGH (ref 70–99)

## 2023-06-21 LAB — RPR: RPR Ser Ql: NONREACTIVE

## 2023-06-21 MED ORDER — AMLODIPINE BESYLATE 5 MG PO TABS
10.0000 mg | ORAL_TABLET | Freq: Once | ORAL | Status: AC
  Administered 2023-06-21: 10 mg via ORAL
  Filled 2023-06-21: qty 2

## 2023-06-21 MED ORDER — ACETAMINOPHEN 500 MG PO TABS
1000.0000 mg | ORAL_TABLET | Freq: Once | ORAL | Status: AC
  Administered 2023-06-21: 1000 mg via ORAL
  Filled 2023-06-21: qty 2

## 2023-06-21 NOTE — ED Provider Notes (Signed)
 Psychiatry is requesting neuro consult, concern for cognitive impairment; consult placed   Spoke w/ Dr Renaee Caro, will see    Teddi Favors, DO 06/21/23 2143

## 2023-06-21 NOTE — Discharge Planning (Signed)
 RNCM met with [name] from APS and pt at bedside. Pt was getting frustrated at times, RNCM was able to redirect and reassure her that we are all just trying to do what is best for her.  Pt asked RNCM to escort her to bathroom, as she did not realize she was in a room with a bathroom attached.  RNCM helped her to the restroom and exited the room so APS can continue the interview.  Adriana Solana J. Rachel Budds, BSN, RN, NCM  Transitions of Care  Nurse Case Manager  [facility] Emergency Departments  Operative Services  [phone]

## 2023-06-21 NOTE — Discharge Planning (Signed)
 RNCM met with pt regarding work-up for today.  RNCM explained that pt will undergo a series of testing including MRI to try and figure out where the pain is coming from.  Pt  was a little reluctant as she says'  "I just wants pain medicine because I know that works."  RNCM offered to be with her to help her get through her testing, pt agreeable at this time.

## 2023-06-21 NOTE — Progress Notes (Addendum)
 3:15pm: CSW spoke with Mauricia South of APS to discuss today's test results. Camilla requested a copy be sent to her via secure email. CSW sent secure email with clinicals attached.  7:54am: CSW spoke with Mauricia South from [facility] APS to inform her of patient being in the ED. Towanda Fret states she is on her way from [facility] to visit patient.  Per Dr. Drury Geralds note from 5/29, Dr. Synetta Eves has requested a MME and capacity evaluation be completed by psychiatry.  CSW spoke with Deeann Fare, RN to inform her of APS visit.  CSW spoke with MD who confirms there is an active order for the cognitive testing.  Shepard Dicker, MSW, LCSW Transitions of Care  Clinical Social Worker II [phone]

## 2023-06-21 NOTE — Consult Note (Signed)
 [facility] Psychiatric Consult Initial  Patient Name: .[name]  MRN: [id number]  DOB: Mar 25, 1945  Consult Order details:  Orders (From admission, onward)     Start     Ordered   06/21/23 0855  CONSULT TO CALL ACT TEAM       Ordering Provider: Lissa Riding, NP  Provider:  (Not yet assigned)  Question:  Reason for Consult?  Answer:  MMSE   06/21/23 0854             Mode of Visit: In person    Psychiatry Consult Evaluation  Service Date: Jun 21, 2023 LOS:  LOS: 0 days  Chief Complaint: "Why do they have me talking to you? I don't have anything wrong with my mental health"   Primary Psychiatric Diagnoses  Moderate cognitive impairment  Assessment   [name] is a 78 y.o. AA female with a past psychiatric history that includes none, and pertinent medical comorbidities/history that include essential hypertension, diabetes type 2, osteoarthritis, history of stroke, chronic headaches, and cervical stenosis of spine, and additional past and pertinent medical history of frequent utilization of emergency medical services for years now, for various somatic complaints, sometimes multiple times per day, who presents this encounter for somatic complaints by way of self, but after EDP evaluation, was felt that psychiatric consultation for formal MOCHA and capacity evaluation is warranted, due to the patient's for years now, and sometimes multiple times per day, frequent utilization of emergency services for various somatic complaints, thus psychiatry was consulted.  Upon evaluation for evaluation of cognitive assessment, patient presents with symptomology that is most consistent with moderate cognitive impairment, though it is important to highlight that formal [place] cognitive assessment (MoCA) was not entirely able to be completed, given patient's endorsements of blurry vision, which resulted in inability to complete the Visuospatial/executive aspects of the exam.  Nonetheless,  given the patient's appreciable scoring from testing in memory, attention, language, abstraction, delayed recall, and orientation, the patient still scored largely in the moderate cognitive impairment category when results are calculated, whether it be 10/22 and/or 10/30 (full scaling/scoring).   Upon evaluation for capacity, specifically the ability to reasonably be able to articulate decision making, per criteria, the patient presents with evidence that is reasonable to indicate, that the patient does have decision-making capacity; though it is important to highlight, capacity evaluation was strictly in service of testing decision making, and nothing else. This is highlighted below. This conclusion is supported by secondary assessment of the patient by provider Dr. Brenita Callow, who agrees with the forementioned conclusion, as it relates to decision making capacity.  The four component model of decisional capacity in the healthcare setting outlined in an article by Eluterio Hamburg and Appelbaum (https://www.nejm.org/doi/full/10.1056/nejmcp074045) is the gold standard for assessing patients' capacity to make decisions related to their medical care  Communication of a choice. Patient is able clearly and reasonably expresses choice of medications/treatment for endorsed generalized pain. The patient has met this criteria.  Demonstrate understanding of the relevant information. Patient is able to paraphrase in her own words the proposed treatment options, stating "I can take stronger tylenol  or other medications the doctor gives me". The patient has met this criteria.  Appreciation of patient's own situation and its consequences ("insight"). Patient is able to appropriately describe the reality of her medical condition and the likely outcomes of the proposed treatment, alternative treatments, and no treatment. The patient has met this criteria.  Reason through relevant information. Patient is able to reason through  and  provide justification for her selection of option/s. The patient has met this criteria.  Based on my evaluation of the above criteria, the patient has met the full standard for medical decision-making capacity.  Upon evaluation for an underlying emergent psychiatry condition, the patient does not present with evidence of having a primary psychiatric condition, or more specifically, presenting as an imminent risk to her self or others, warranting involuntary commitment. The patient presents with no evidence of psychosis, delusional thinking, paranoia and/or ideas of reference, suicidal and or homicidal ideations, endorsements of instability of her mental health in the form of depression and/or anxiety, and does not endorse illicit substance use and/or EtOH abuse.  From collateral obtained from the patient's family, specifically the patient's daughter, the patient's family endorse no concerns for the patient living on her own, managing her own affairs, the patient's cognitive abilities, ability to maintain her own safety, ability to feed, close, and care for herself, and have expressed that they do not plan to consider guardianship for the patient, have the patient be placed in any type of skilled nursing or adult care facility, and that their desire is to continue to support the patient and her independence.  Given the evaluation and findings, the recommendations are, as it strictly relates to the appreciable finding of moderate cognitive impairment (as evidence by scoring during formal testing), is to strongly encourage the patient/family to closely follow-up with outpatient services for primary care and neurology.  During conversation that was extensively held with the patient's daughter, patient's daughter highlighted numerous times she wants to maintain her mother's independence, and while she does not like that her mother utilizes emergency services so often, and even discourages her from going to the  emergency department all the time, she states that she does not want to intervene in this regard.  During conversation with the patient, ultimately, the patient endorses that she does not have a desire to follow-up closely with primary care and/or neurology services, but rather utilize emergency room services.  Diagnoses:  Active Hospital problems: Principal Problem:   Moderate cognitive impairment    Plan   #Moderate cognitive impairment  ## Psychiatric Recommendations:   - Recommend close outpatient follow-up with primary care services - Recommend close outpatient follow-up with neurology - Recommend repeat MocA evaluation during close outpatient follow-up with neurology  ## Medical Decision Making Capacity: Has capacity for medical decision-making  ## Further Work-up: None at this time  ## Disposition:-- There are no psychiatric contraindications to discharge at this time  ## Behavioral / Environmental: -None at this time    ## Safety and Observation Level:  - Based on my clinical evaluation, I estimate the patient to be at low risk of self harm in the current setting and upon discharge. - At this time, we recommend  routine. This decision is based on my review of the chart including patient's history and current presentation, interview of the patient, mental status examination, and consideration of suicide risk including evaluating suicidal ideation, plan, intent, suicidal or self-harm behaviors, risk factors, and protective factors. This judgment is based on our ability to directly address suicide risk, implement suicide prevention strategies, and develop a safety plan while the patient is in the clinical setting. Please contact our team if there is a concern that risk level has changed.  CSSR Risk Category:C-SSRS RISK CATEGORY: No Risk  Suicide Risk Assessment: Patient has following modifiable risk factors for suicide: None, which we are addressing by recommendations. Patient  has following non-modifiable or demographic risk factors for suicide: None Patient has the following protective factors against suicide: Access to outpatient mental health care, Supportive family, Supportive friends, Cultural, spiritual, or religious beliefs that discourage suicide, Frustration tolerance, no history of suicide attempts, and no history of NSSIB  Thank you for this consult request. Recommendations have been communicated to the primary team.  We will sign off at this time.   Volanda Gruber, NP       History of Present Illness   [name] is a 78 y.o. AA female with a past psychiatric history that includes none, and pertinent medical comorbidities/history that include essential hypertension, diabetes type 2, osteoarthritis, history of stroke, chronic headaches, and cervical stenosis of spine, and additional past and pertinent medical history of frequent utilization of emergency medical services for years now, for various somatic complaints, sometimes multiple times per day, who presents this encounter for somatic complaints by way of self, but after EDP evaluation, was felt that psychiatric consultation for formal MOCHA and capacity evaluation is warranted, due to the patient's for years now, and sometimes multiple times per day, frequent utilization of emergency services for various somatic complaints, thus psychiatry was consulted.  Patient seen today at [facility] emergency department for face-to-face psychiatric evaluation and testing.  Upon evaluation, patient endorses that she came in this encounter because of generalized pain and other various somatic complaints that she states need treatment, states that, "ever since I had that bad fall on the city bus, I have not been the same".  Patient then abruptly, and with an irritable edge, asks this provider, "why do they have me talking to you? I don't have anything wrong with my mental health" .  Patient explained the reason for  consultation, specifically to evaluate the patient's cognitive abilities through formal testing, to assess her ability for medical decision making capacity, and to evaluate for an emergent psychiatric mental health condition, to which after making a few small irritable statements, patient endorses that she is amenable to participating, though highlights, "I really need something to be done though about my pain".  Patient orientation assessed, appreciably presents oriented, and without concerns for fluctuations in consciousness, as well as is able to tell me the names of her children and their birthdays, her birthdate, her address that she lives at, including what type of living arrangement she has, the medications that she takes, and the conditions that she has medically, and denies any mental health conditions repeatedly.  Patient endorses no instability again in her mental health, denies any anxious and/or depressive symptomology, denies symptoms consistent with psychosis, mania, and/or delusions, and denies illicit substance use, tobacco use, and/or EtOH use; no toxicology urine data obtained, but chart review reflects no endorsements historically of substance use or abuse.  Patient denied auditory and visual hallucinations, and objectively, did not appear again to be presenting with responding to stimuli and appropriately.  Patient endorses no suicidal and or homicidal ideations, denies suicide attempts, and denies any history of self-injurious behavior.  Patient endorses that she sleeps and eats fairly normal.  Patient endorses that she is retired and on [employer] income, has been for many years.  Patient endorses that she frequently sees her daughter who works full-time in a nursing home, but outside of this, states that her other children, "do not come around unless they need something".  Patient endorses no inpatient mental health hospitalizations and denies outpatient mental health medication  management and/or therapy service  history.   Upon formal cognitive assessment, patient presents with symptomology that is most consistent with moderate cognitive impairment, though it is important to highlight that formal [place] cognitive assessment (MoCA) was not entirely able to be completed, given patient endorsed blurry vision, which resulted in inability to complete the Visuospatial/executive aspects of the exam.  Nonetheless, given the patient's appreciable scoring from testing in memory, attention, language, abstraction, delayed recall, and orientation, the patient still scored largely in the moderate cognitive impairment category when results are calculated, whether it be 10/22 and/or 10/30 (full scaling/scoring).  Lastly, capacity valuation was performed, and upon evaluation, patient appreciably presented reasonably with medical decision making capacity.  Collateral, patient's daughter, Ms. Patricio Boop, spoke to at [phone]  Call placed extensive conversation held with the patient's daughter.  Patient's daughter reports that she is aware that the patient for a very long time now has been a frequent utilizer of emergency services for various somatic complaints, and states that she tries to encourage her mother to utilize outpatient services, but states that her mother does not listen.    Patient's daughter reports that she does not have any concerns for her mother's mental health, denies any appreciable cognitive impairments, psychotic features, depression or anxiety, or illicit substance use or abuse.  Patient's daughter endorses that she does not have a desire to obtain guardianship over her mother, expresses numerous times that she wants to maintain her mother's independence, and if her mother wants to live on her own, states that she has no safety concerns or problems with this.  Patient's daughter reports that she sees her mother often on her days off.  Patient's daughter reports that  she attends to all of her own finances, safety and shelter needs, taking her medications, and feeding herself.  Evaluation was extensively talked about that was performed by this provider, including the findings of mild cognitive impairment, and extensive review of recommendations were given, to which patient's daughter verbalized that her priority was to maintain her mother's independence, though she would continue to encourage her mother to consider utilizing outpatient services.  Collateral, Dr. Brenita Callow, spoken to over the phone and additional consultation and second opinion  Call placed and extensive conversation held with Dr. Brenita Callow, a member of the behavioral health service line and attending provider who also was able to see the patient today.  Dr. Brenita Callow agrees that specifically the patient has medical decision making capacity.  Review of Systems  Eyes:  Positive for blurred vision.  Musculoskeletal:  Positive for back pain, joint pain, myalgias and neck pain.  Psychiatric/Behavioral:  Negative for depression, hallucinations, substance abuse and suicidal ideas. The patient is not nervous/anxious and does not have insomnia.   All other systems reviewed and are negative.   Psychiatric and Social History  Psychiatric History:  Information collected from Chart review/patient/family   Prev Dx/Sx: None endorsed or reported Current Psych Provider: None endorsed or reported Home Meds (current): None endorsed or reported Previous Med Trials: None endorsed or reported Therapy: None endorsed or reported  Prior Psych Hospitalization: None endorsed or reported  Prior Self Harm: None endorsed or reported Prior Violence: None endorsed or reported  Family Psych History: None endorsed or reported Family Hx suicide: None endorsed or reported  Social History:  Developmental Hx: Normal Educational Hx: High school Occupational Hx: Retired Armed forces operational officer Hx: None endorsed or reported Living  Situation: Lives alone Spiritual Hx: Christian  Access to weapons/lethal means: None endorsed  Substance History Alcohol : None endorsed or  reported Tobacco: None endorsed or reported Illicit drugs: None endorsed or reported Prescription drug abuse: None endorsed or reported Rehab hx: None endorsed or reported  Exam Findings  Physical Exam: As below  Vital Signs:  Temp:  [98.2 F (36.8 C)-98.4 F (36.9 C)] 98.3 F (36.8 C) (05/30 0037) Pulse Rate:  [56-61] 60 (05/30 0037) Resp:  [15-18] 16 (05/30 0037) BP: (194-209)/(53-83) 206/53 (05/30 0037) SpO2:  [99 %-100 %] 100 % (05/30 0037) Weight:  [48.5 kg] 48.5 kg (05/29 1759) Blood pressure (!) 206/53, pulse 60, temperature 98.3 F (36.8 C), temperature source Oral, resp. rate 16, height 5\' 4"  (1.626 m), weight 48.5 kg, SpO2 100%. Body mass index is 18.37 kg/m.  Physical Exam Vitals and nursing note reviewed.  Constitutional:      General: She is not in acute distress.    Appearance: Normal appearance. She is normal weight. She is not ill-appearing or toxic-appearing.     Comments: Mildly atypical interpersonal style with irritable edge    Pulmonary:     Effort: Pulmonary effort is normal.  Skin:    General: Skin is warm and dry.  Neurological:     Mental Status: She is alert and oriented to person, place, and time.     Motor: No weakness, tremor or seizure activity.  Psychiatric:        Attention and Perception: Perception normal. She is inattentive (Mildly inattentive). She does not perceive auditory or visual hallucinations.        Speech: Speech is delayed.        Behavior: Behavior is agitated. Behavior is cooperative.        Thought Content: Thought content normal. Thought content is not paranoid or delusional. Thought content does not include homicidal or suicidal ideation.        Cognition and Memory: Cognition is impaired (Mild to moderate). Memory is impaired (Mild to moderate).     Comments: Mood: "frustrated"  Affect: Neutral to mildly constricted    Mental Status Exam: General Appearance: Well Groomed, AA elderly female, with mildly atypical interpersonal style and irritable edge, with appropriate clothing  Orientation:  Full (Time, Place, and Person)  Memory:  Immediate;   Poor Recent;   Poor Remote;   Good  Concentration:  Concentration: Mildly inattentive and Attention Span: Mildly inattentive  Recall:  Variable   Attention  Other: Mildly inattentive  Eye Contact:  Variable to fair  Speech:  Clear and Coherent and intermittently delayed to normal  Language:  Largely fair, some difficulty with certain words  Volume:  Normal  Mood: Frustrated  Affect:  Neutral to mildly constricted  Thought Process:  Coherent and Goal Directed  Thought Content: Superficially linear to circumstantial  Suicidal Thoughts:  No  Homicidal Thoughts:  No  Judgement:  Intact  Insight:  Lacking and Shallow  Psychomotor Activity:  Normal  Akathisia:  No  Fund of Knowledge:  Poor      Assets:  Communication Skills Desire for Improvement Financial Resources/Insurance Housing Leisure Time Physical Health Resilience Social Support Talents/Skills Transportation Vocational/Educational  Cognition:  Impaired,  Moderate  ADL's:  Intact  AIMS (if indicated):   0     Other History   These have been pulled in through the EMR, reviewed, and updated if appropriate.  Family History:  The patient's family history is not on file.  Medical History: Past Medical History:  Diagnosis Date   Diabetes mellitus without complication (HCC)    GERD 09/04/2006   Qualifier: Diagnosis of  By: Jenice Mitts     History of echocardiogram    a. 2D ECHO: 11/06/2013 EF 65%; no WMA. Mild TR. PA pk pressure 43 mm HG   Hypertension    Stroke [facility])     Surgical History: Past Surgical History:  Procedure Laterality Date   ABDOMINAL HYSTERECTOMY     ANTERIOR CERVICAL DECOMPRESSION/DISCECTOMY FUSION 4 LEVELS N/A 11/11/2019    Procedure: Cervical three-four Cervical four-five Cervical five-six Cervical six-seven  Anterior cervical decompression/discectomy/fusion;  Surgeon: Manya Sells, MD;  Location: [facility] OR;  Service: Neurosurgery;  Laterality: N/A;   LEFT HEART CATHETERIZATION WITH CORONARY ANGIOGRAM N/A 11/05/2013   Procedure: LEFT HEART CATHETERIZATION WITH CORONARY ANGIOGRAM;  Surgeon: Millicent Ally, MD;  Location: [facility] CATH LAB;  Service: Cardiovascular;  Laterality: N/A;     Medications:   Current Facility-Administered Medications:    hydrOXYzine  (ATARAX ) tablet 25 mg, 25 mg, Oral, TID PRN, Merdis Stalling, MD   metFORMIN  (GLUCOPHAGE ) tablet 500 mg, 500 mg, Oral, BID WC, Merdis Stalling, MD, 500 mg at 06/21/23 4098   pantoprazole  (PROTONIX ) EC tablet 20 mg, 20 mg, Oral, Daily, Merdis Stalling, MD, 20 mg at 06/21/23 1191  Current Outpatient Medications:    acetaminophen  (TYLENOL ) 500 MG tablet, Take 1 tablet (500 mg total) by mouth every 6 (six) hours as needed., Disp: 30 tablet, Rfl: 0   albuterol  (VENTOLIN  HFA) 108 (90 Base) MCG/ACT inhaler, Inhale 1-2 puffs into the lungs every 6 (six) hours as needed for wheezing or shortness of breath., Disp: 18 g, Rfl: 0   amLODipine  (NORVASC ) 10 MG tablet, Take 1 tablet (10 mg total) by mouth daily., Disp: 30 tablet, Rfl: 3   chlorthalidone  (HYGROTON ) 25 MG tablet, TAKE 1 TABLET (25 MG TOTAL) BY MOUTH DAILY., Disp: 30 tablet, Rfl: 0   diclofenac  Sodium (VOLTAREN ) 1 % GEL, Apply 4 g topically 4 (four) times daily., Disp: 350 g, Rfl: 0   diphenhydrAMINE  (BENADRYL ) 25 MG tablet, Take 1 tablet (25 mg total) by mouth every 6 (six) hours as needed for itching or allergies., Disp: 30 tablet, Rfl: 0   gabapentin  (NEURONTIN ) 100 MG capsule, Take 1 capsule (100 mg total) by mouth 3 (three) times daily as needed., Disp: 30 capsule, Rfl: 1   hydrocortisone  cream 1 %, Apply to affected area 2 times daily, Disp: 15 g, Rfl: 0   hydrOXYzine  (ATARAX ) 25 MG tablet, Take 1 tablet (25 mg  total) by mouth every 8 (eight) hours as needed., Disp: 12 tablet, Rfl: 0   lidocaine  (LIDODERM ) 5 %, Place 1 patch onto the skin daily. Remove & Discard patch within 12 hours or as directed by MD, Disp: 30 patch, Rfl: 0   lidocaine  (LIDODERM ) 5 %, Place 1 patch onto the skin daily. Remove & Discard patch within 12 hours or as directed by MD, Disp: 15 patch, Rfl: 0   lidocaine  (LIDODERM ) 5 %, Place 1 patch onto the skin daily. Remove & Discard patch within 12 hours or as directed by MD, Disp: 30 patch, Rfl: 0   loratadine  (CLARITIN ) 10 MG tablet, Take 1 tablet (10 mg total) by mouth daily., Disp: 30 tablet, Rfl: 0   metFORMIN  (GLUCOPHAGE ) 500 MG tablet, Take 1 tablet (500 mg total) by mouth 2 (two) times daily with a meal., Disp: 180 tablet, Rfl: 3   methocarbamol  (ROBAXIN ) 500 MG tablet, Take 1 tablet (500 mg total) by mouth 2 (two) times daily., Disp: 20 tablet, Rfl: 0   metoprolol  succinate (TOPROL -XL) 25 MG 24  hr tablet, Take 1 tablet (25 mg total) by mouth daily., Disp: 30 tablet, Rfl: 2   naproxen  (NAPROSYN ) 375 MG tablet, Take 1 tablet (375 mg total) by mouth 2 (two) times daily., Disp: 20 tablet, Rfl: 0   pantoprazole  (PROTONIX ) 20 MG tablet, Take 1 tablet (20 mg total) by mouth daily., Disp: 30 tablet, Rfl: 0   triamcinolone  cream (KENALOG ) 0.1 %, Apply 1 Application topically 2 (two) times daily. Apply to rash, Disp: 30 g, Rfl: 0  Allergies: Allergies  Allergen Reactions   Codeine Other (See Comments)    Gi upset   Ibuprofen  Other (See Comments)    REACTION: Elevated Blood Pressure   Imdur [Isosorbide Nitrate] Other (See Comments)    Headache & upset stomach    Volanda Gruber, NP

## 2023-06-21 NOTE — ED Provider Notes (Signed)
 Mri reports pt has a metal piece in her hair.  They are unable to do MRI.   Charge nurse Georganne Kind and I discussed with pt.  Pt understands that she has something embedded in her hair.  Pt wants to have MRI.  Pt states she has a big matt on her head.  Pt agrees to getting hair cut away to remove foreign object so that she can have imaging.  She reports something back there has been hurting her for a long time.  She is unsure what it is.   Large matted area of hair trimmed away.  No foreign body to scalp felt.     Sandi Crosby, PA-C 06/21/23 1342    Albertus Hughs, DO 06/21/23 1505

## 2023-06-21 NOTE — Care Management (Signed)
 As per  Dr. Deborah Falling  of  Psychiatry note 06/21/23  at 18:28, he recommends a neurology eval to assess cognitive impairment. ED RNCM Discussed with EDP, neuro consult placed.  Patient awaiting neuro eval.

## 2023-06-21 NOTE — Discharge Planning (Signed)
 RNCM met with pt regarding recent visits with psych and DSS.  Pt says she feels good, but would really like some cornbread and cole slaw.  RNCM purchased cornbread and cole slaw from [employer] and cleared with EDP before giving it to pt.  Pt very appreciative of meal.  Vidyuth Belsito J. Rachel Budds, BSN, RN, NCM  Transitions of Care  Nurse Case Manager  [facility] Emergency Departments  Operative Services  [phone]

## 2023-06-21 NOTE — ED Notes (Signed)
 Pt has requested pain medication, states "regular tylenol  doesn't work". RN Cathleen Coach notified.

## 2023-06-21 NOTE — Discharge Instructions (Addendum)
You were evaluated in the Emergency Department and after careful evaluation, we did not find any emergent condition requiring admission or further testing in the hospital.  Your exam/testing today is overall reassuring.  Please return to the Emergency Department if you experience any worsening of your condition.   Thank you for allowing us to be a part of your care. 

## 2023-06-21 NOTE — Evaluation (Addendum)
 Speech Language Pathology Evaluation Patient Details Name: [name] MRN: [id number] DOB: [birth year] Today's Date: 06/21/2023 Time: 7829-5621 SLP Time Calculation (min) (ACUTE ONLY): 15 min  Problem List:  Patient Active Problem List   Diagnosis Date Noted   Moderate cognitive impairment 06/16/2021   Chronic headache 06/16/2021   Healthcare maintenance 06/16/2021   Frequent patient in emergency department 06/16/2021   Cervical stenosis of spine 11/10/2019   Type 2 diabetes mellitus with hyperlipidemia (HCC) 06/20/2012   History of stroke 06/20/2012   Essential hypertension 09/04/2006   Osteoarthritis 09/04/2006   Past Medical History:  Past Medical History:  Diagnosis Date   Diabetes mellitus without complication (HCC)    GERD 09/04/2006   Qualifier: Diagnosis of  By: Jenice Mitts     History of echocardiogram    a. 2D ECHO: 11/06/2013 EF 65%; no WMA. Mild TR. PA pk pressure 43 mm HG   Hypertension    Stroke [facility])    Past Surgical History:  Past Surgical History:  Procedure Laterality Date   ABDOMINAL HYSTERECTOMY     ANTERIOR CERVICAL DECOMPRESSION/DISCECTOMY FUSION 4 LEVELS N/A 11/11/2019   Procedure: Cervical three-four Cervical four-five Cervical five-six Cervical six-seven  Anterior cervical decompression/discectomy/fusion;  Surgeon: Manya Sells, MD;  Location: [facility] OR;  Service: Neurosurgery;  Laterality: N/A;   LEFT HEART CATHETERIZATION WITH CORONARY ANGIOGRAM N/A 11/05/2013   Procedure: LEFT HEART CATHETERIZATION WITH CORONARY ANGIOGRAM;  Surgeon: Millicent Ally, MD;  Location: [facility] CATH LAB;  Service: Cardiovascular;  Laterality: N/A;   HPI:  [name] is a 78 yo female presenting to ED 5/29 with body aches and blurry vision after multiple recent visits for similar complaints. CTH negative. MRI pending. MD asked for SLP consult to complete the SLUMS. PMH includes DM, GERD, HTN, prior CVA   Assessment / Plan / Recommendation Clinical Impression  Pt states that  she lives alone at home and is typically independent, using public transit for transportation. Portions of the SLUMS were omitted due to pt's report of blurry vision. Of the remaining 24 points, pt scored a 4 indicating significant cognitive impairment. She presents with deficits related to orientation, memory (storage and retrieval), sustained attention, problem solving, awareness, and executive functioning. Of note, [place] kept out of school, only completing ninth grade. No family was present throughout the evaluation to confirm PLOF, although pt's presentation is suspected to be chronic in nature. Question her ability to safely live independently given the extent of cognitive deficits. Recommend a trial of SLP f/u to target deficits listed above.    SLP Assessment  SLP Recommendation/Assessment: Patient needs continued Speech Lanaguage Pathology Services SLP Visit Diagnosis: Cognitive communication deficit (R41.841)    Recommendations for follow up therapy are one component of a multi-disciplinary discharge planning process, led by the attending physician.  Recommendations may be updated based on patient status, additional functional criteria and insurance authorization.    Follow Up Recommendations  Follow physician's recommendations for discharge plan and follow up therapies (will need 24/7 supervision at next venue)   Assistance Recommended at Discharge  Frequent or constant Supervision/Assistance  Functional Status Assessment Patient has had a recent decline in their functional status and/or demonstrates limited ability to make significant improvements in function in a reasonable and predictable amount of time  Frequency and Duration min 1 x/week  2 weeks      SLP Evaluation Cognition  Overall Cognitive Status: Impaired/Different from baseline  Attention: Focused Focused Attention: Impaired Focused Attention Impairment:  Verbal basic;Functional basic Memory: Impaired Memory Impairment: Storage deficit;Retrieval deficit Awareness: Impaired Awareness Impairment: Intellectual impairment Problem Solving: Impaired Problem Solving Impairment: Verbal basic Executive Function: Reasoning;Sequencing Reasoning: Impaired Reasoning Impairment: Verbal basic;Functional basic Sequencing: Impaired Sequencing Impairment: Verbal basic;Functional basic       Comprehension  Auditory Comprehension Overall Auditory Comprehension: Appears within functional limits for tasks assessed    Expression Expression Primary Mode of Expression: Verbal Verbal Expression Overall Verbal Expression: Appears within functional limits for tasks assessed   Oral / Motor  Oral Motor/Sensory Function Overall Oral Motor/Sensory Function: Within functional limits Motor Speech Overall Motor Speech: Appears within functional limits for tasks assessed            Amil Kale, M.A., CCC-SLP Speech Language Pathology, Acute Rehabilitation Services  Secure Chat preferred [phone]  06/21/2023, 1:48 PM

## 2023-06-21 NOTE — TOC CM/SW Note (Signed)
 SW met with patient at bedside, patient presented with some confusion, she expressed her frustrations about the food and requesting juice and cole slaw. Patient has a consult in to be seen with neurology, patient has been dinged with mild cognitive impairment. Patient's home is not in the best condition/livable, patient presented with same clothing that is dirty. There is concerns regarding patient being safe living alone in her home due to changes in her decision making and cognitive abilities.   Patient will continue to be follow by [facility] and continue assess.   .Hikeem Andersson, MSW, LCSWA Transition of Care  Clinical Social Worker (ED 3-11 Mon-Fri)  [phone]

## 2023-06-21 NOTE — ED Notes (Signed)
 This RN and Mariah Shines PA cut all the mattes from patients hair and no metal object found.  No bedbugs noted either.

## 2023-06-21 NOTE — Consult Note (Signed)
 NEUROLOGY CONSULT NOTE   Date of service: Jun 21, 2023 Patient Name: [name] MRN:  [id number] DOB:  10/19/1945 Chief Complaint: "My wrist hurts" Requesting Provider: System, Provider Not In  History of Present Illness  [name] is [name] 78 y.o. female with [name] PMHx of DM, GERD, stroke and HTN, well-known to the ED due to numerous/repeated visits for similar complaints, who re-presented on Thursday evening with "same complaints as always with body aches". She was only oriented to self and place and was unable to tell RN the date and time.   Dr Synetta Eves requested that [name] MME and capacity evaluation be requested by Psychiatry.   Psychiatry evaluated the patient this afternoon, with conclusions as follows: " I agree with the evaluation done by [name], the APP and the impressions of Dr. Brenita Callow. However, the patient does appear to have at least mild to moderate cognitive impairment. It is important to note that decision-making capacity is assessed for [name] specific decision at [name] specific moment in time. The reason for this is different decisions have different risks and benefits. The threshold for an individual to display adequate decision-making capacity to provide consent to or refuse treatment should reflect the risks and benefits of that specific decision. Furthermore, decision-making capacity is time sensitive meaning if an individual does not have the capacity to make [name] decision one day, this does not mean that they will not have the capacity to make the same medical decision at [name] later time. We strongly recommend [name] neurology evaluation to assess the extent of the cognitive impairment and if any required interventions."     ROS  As documented above. In the context of patient being [name], ROS is limited.   Past History   Past Medical History:  Diagnosis Date   Diabetes mellitus without complication (HCC)    GERD 09/04/2006   Qualifier: Diagnosis of  By: Jenice Mitts     History of  echocardiogram    [name]. 2D ECHO: 11/06/2013 EF 65%; no WMA. Mild TR. PA pk pressure 43 mm HG   Hypertension    Stroke [facility])     Past Surgical History:  Procedure Laterality Date   ABDOMINAL HYSTERECTOMY     ANTERIOR CERVICAL DECOMPRESSION/DISCECTOMY FUSION 4 LEVELS N/[name] 11/11/2019   Procedure: Cervical three-four Cervical four-five Cervical five-six Cervical six-seven  Anterior cervical decompression/discectomy/fusion;  Surgeon: Manya Sells, MD;  Location: [facility] OR;  Service: Neurosurgery;  Laterality: N/[name];   LEFT HEART CATHETERIZATION WITH CORONARY ANGIOGRAM N/[name] 11/05/2013   Procedure: LEFT HEART CATHETERIZATION WITH CORONARY ANGIOGRAM;  Surgeon: Millicent Ally, MD;  Location: [facility] CATH LAB;  Service: Cardiovascular;  Laterality: N/[name];    Family History: History reviewed. No pertinent family history.  Social History  reports that she has never smoked. She has never used smokeless tobacco. She reports that she does not drink alcohol  and does not use drugs.  Allergies  Allergen Reactions   Codeine Other (See Comments)    Gi upset   Ibuprofen  Other (See Comments)    REACTION: Elevated Blood Pressure   Imdur [Isosorbide Nitrate] Other (See Comments)    Headache & upset stomach    Medications   Current Facility-Administered Medications:    amLODipine  (NORVASC ) tablet 10 mg, 10 mg, Oral, Once, Adriana Courts A, DO   hydrOXYzine  (ATARAX ) tablet 25 mg, 25 mg, Oral, TID PRN, Merdis Stalling, MD   metFORMIN  (GLUCOPHAGE ) tablet 500 mg, 500 mg, Oral, BID WC, Merdis Stalling, MD, 500 mg at  06/21/23 1723   pantoprazole  (PROTONIX ) EC tablet 20 mg, 20 mg, Oral, Daily, Merdis Stalling, MD, 20 mg at 06/21/23 8469  Current Outpatient Medications:    acetaminophen  (TYLENOL ) 500 MG tablet, Take 1 tablet (500 mg total) by mouth every 6 (six) hours as needed., Disp: 30 tablet, Rfl: 0   albuterol  (VENTOLIN  HFA) 108 (90 Base) MCG/ACT inhaler, Inhale 1-2 puffs into the lungs every 6 (six) hours as needed for  wheezing or shortness of breath., Disp: 18 g, Rfl: 0   amLODipine  (NORVASC ) 10 MG tablet, Take 1 tablet (10 mg total) by mouth daily., Disp: 30 tablet, Rfl: 3   chlorthalidone  (HYGROTON ) 25 MG tablet, TAKE 1 TABLET (25 MG TOTAL) BY MOUTH DAILY., Disp: 30 tablet, Rfl: 0   diclofenac  Sodium (VOLTAREN ) 1 % GEL, Apply 4 g topically 4 (four) times daily., Disp: 350 g, Rfl: 0   diphenhydrAMINE  (BENADRYL ) 25 MG tablet, Take 1 tablet (25 mg total) by mouth every 6 (six) hours as needed for itching or allergies., Disp: 30 tablet, Rfl: 0   gabapentin  (NEURONTIN ) 100 MG capsule, Take 1 capsule (100 mg total) by mouth 3 (three) times daily as needed., Disp: 30 capsule, Rfl: 1   hydrocortisone  cream 1 %, Apply to affected area 2 times daily, Disp: 15 g, Rfl: 0   hydrOXYzine  (ATARAX ) 25 MG tablet, Take 1 tablet (25 mg total) by mouth every 8 (eight) hours as needed., Disp: 12 tablet, Rfl: 0   lidocaine  (LIDODERM ) 5 %, Place 1 patch onto the skin daily. Remove & Discard patch within 12 hours or as directed by MD, Disp: 30 patch, Rfl: 0   lidocaine  (LIDODERM ) 5 %, Place 1 patch onto the skin daily. Remove & Discard patch within 12 hours or as directed by MD, Disp: 15 patch, Rfl: 0   lidocaine  (LIDODERM ) 5 %, Place 1 patch onto the skin daily. Remove & Discard patch within 12 hours or as directed by MD, Disp: 30 patch, Rfl: 0   loratadine  (CLARITIN ) 10 MG tablet, Take 1 tablet (10 mg total) by mouth daily., Disp: 30 tablet, Rfl: 0   metFORMIN  (GLUCOPHAGE ) 500 MG tablet, Take 1 tablet (500 mg total) by mouth 2 (two) times daily with [name] meal., Disp: 180 tablet, Rfl: 3   methocarbamol  (ROBAXIN ) 500 MG tablet, Take 1 tablet (500 mg total) by mouth 2 (two) times daily., Disp: 20 tablet, Rfl: 0   metoprolol  succinate (TOPROL -XL) 25 MG 24 hr tablet, Take 1 tablet (25 mg total) by mouth daily., Disp: 30 tablet, Rfl: 2   naproxen  (NAPROSYN ) 375 MG tablet, Take 1 tablet (375 mg total) by mouth 2 (two) times daily., Disp: 20  tablet, Rfl: 0   pantoprazole  (PROTONIX ) 20 MG tablet, Take 1 tablet (20 mg total) by mouth daily., Disp: 30 tablet, Rfl: 0   triamcinolone  cream (KENALOG ) 0.1 %, Apply 1 Application topically 2 (two) times daily. Apply to rash, Disp: 30 g, Rfl: 0  Vitals   Vitals:   06/20/23 1753 06/20/23 1759 06/20/23 2045 06/21/23 0037  BP: (!) 194/83  (!) 209/63 (!) 206/53  Pulse: 61  (!) 56 60  Resp: 18  15 16   Temp: 98.4 F (36.9 C)  98.2 F (36.8 C) 98.3 F (36.8 C)  TempSrc:    Oral  SpO2: 99%  100% 100%  Weight:  48.5 kg    Height:  5\' 4"  (1.626 m)      Body mass index is 18.37 kg/m.  Physical Exam  Physical Exam  HEENT:  [place]/AT Lungs: Respirations unlabored Extremities: Warm and well perfused.   Neurological Examination Mental Status: Awake and alert. Orientation testing is as follows: Day "I forget", Month "May or April", Year "I don't keep that stuff in my head". She is correctly oriented to the city and state. She can name [name] thumb and pinky finger, but not an index finger. She can name [name] pen and [name] glove, but not [name] badge. Speech fluent with intact comprehension for all basic commands and questions. No dysarthria. Repetition is impaired.  Impaired concentration: She can recite the months of the year forwards, but can only get to November when reciting them backwards.   Can only generate 6 animals with 4 legs in 60 seconds.  Can only generate 4 words beginning with the letter "D" in 60 seconds.  Poor insight.  Memory: She has difficulty registering 5 memory items, but can do so after 3 trials. She is unable to remember any of the items after [name] 5 minute delay, even with cues.  Cranial Nerves: II: Visual fields intact bilaterally. No extinction to DSS.   III,IV, VI: No ptosis. EOMI. No nystagmus.  V: Temp sensation equal bilaterally VII: Smile symmetric VIII: Hearing intact to conversation IX,X: No hoarseness or hypophonia XI: Symmetric XII: Midline tongue extension Motor: Right  : Upper extremity   5/5    Left:     Upper extremity   5/5  Lower extremity   5/5     Lower extremity   5/5 No pronator drift Sensory: Temp and light touch intact throughout, bilaterally Deep Tendon Reflexes: 2+ and symmetric throughout Cerebellar: No ataxia with FNF or H-S bilaterally Gait: Deferred   Labs/Imaging/Neurodiagnostic studies   CBC:  Recent Labs  Lab July 11, 2023 1816 06/20/23 1830  WBC 6.7 6.0  NEUTROABS  --  3.7  HGB 11.1* 10.8*  HCT 34.8* 34.0*  MCV 93.0 94.4  PLT 223 236   Basic Metabolic Panel:  Lab Results  Component Value Date   NA 139 06/20/2023   K 3.8 06/20/2023   CO2 23 06/20/2023   GLUCOSE 102 (H) 06/20/2023   BUN 10 06/20/2023   CREATININE 1.00 06/20/2023   CALCIUM  9.2 06/20/2023   GFRNONAA 58 (L) 06/20/2023   GFRAA 39 (L) 10/26/2019   Lipid Panel:  Lab Results  Component Value Date   LDLCALC 61 06/15/2021   HgbA1c:  Lab Results  Component Value Date   HGBA1C 5.7 ([name]) 06/15/2021   Urine Drug Screen: No results found for: "LABOPIA", "COCAINSCRNUR", "LABBENZ", "AMPHETMU", "THCU", "LABBARB"  Alcohol  Level No results found for: "ETH" INR  Lab Results  Component Value Date   INR 0.9 11/09/2019     ASSESSMENT  Vonne Bains is [name] 78 y.o. female with suspected cognitive impairment.  - Exam reveals impairment in 3 tested cognitive domains: Language (naming), orientation, memory and concentration. No focal motor weakness, sensory loss or ataxia noted.  - Labs are without evidence for any significant derangements that would be expected to produce [name] delirium or an encephalopathy. Vitamin B12 is normal. TSH normal. LFTs normal. RPR negative.  - MRI brain: Normal brain MRI for age. No acute intracranial abnormality.  - Impression: Possible dementia. Epidemiologically, the most common etiology would be Alzheimer's disease. No evidence for [name] vascular dementia on MRI.   RECOMMENDATIONS  - Ammonia level, vitamin D level, thiamine level, ESR and CRP  -  Outpatient Neurology follow up for comprehensive dementia evaluation.  ______________________________________________________________________    Signed,  Renaee Caro Oneta Sigman, MD [facility]uro[facility]ist

## 2023-06-22 DIAGNOSIS — R52 Pain, unspecified: Secondary | ICD-10-CM | POA: Diagnosis not present

## 2023-06-22 LAB — C-REACTIVE PROTEIN: CRP: <0.5 mg/dL (ref <1.0)

## 2023-06-22 LAB — CBG MONITORING, ED
Glucose-Capillary: 89 mg/dL (ref 70–99)
Glucose-Capillary: 98 mg/dL (ref 70–99)

## 2023-06-22 LAB — VITAMIN D 25 HYDROXY (VIT D DEFICIENCY, FRACTURES): Vit D, 25-Hydroxy: 35.84 ng/mL (ref 30–100)

## 2023-06-22 LAB — AMMONIA: Ammonia: 23 umol/L (ref 9–35)

## 2023-06-22 NOTE — ED Provider Notes (Signed)
 Patient is still boarding pending TOC assistance for possible placement, potential memory care, given concerns for dementia.  Patient was evaluated by neurologist yesterday, who felt that her symptoms are likely consistent with dementia.  She did not have other emergent findings on MRI imaging of the brain.   Arvilla Birmingham, MD 06/22/23 [phone]

## 2023-06-22 NOTE — NC FL2 (Signed)
 [place]  MEDICAID FL2 LEVEL OF CARE FORM     IDENTIFICATION  Patient Name: [name] Birthdate: 08/03/1945 Sex: female Admission Date (Current Location): 06/20/2023  [place] and [place] Number:  [name] [id number] S Facility and Address:  [facility], [address] [place], [place] [address]      Provider Number: [id number]  Attending Physician Name and Address:  Dr. Jerald Molly  Relative Name and Phone Number:       Current Level of Care: Hospital Recommended Level of Care: Memory Care Prior Approval Number:    Date Approved/Denied:   PASRR Number:    Discharge Plan: Other (Comment) (Memory Care)    Current Diagnoses: Patient Active Problem List   Diagnosis Date Noted    Dementia Moderate cognitive impairment 06/22/2023 06/16/2021   Chronic headache 06/16/2021   Healthcare maintenance 06/16/2021   Frequent patient in emergency department 06/16/2021   Cervical stenosis of spine 11/10/2019   Type 2 diabetes mellitus with hyperlipidemia (HCC) 06/20/2012   History of stroke 06/20/2012   Essential hypertension 09/04/2006   Osteoarthritis 09/04/2006    Orientation RESPIRATION BLADDER Height & Weight     Self, Time, Situation, Place  Normal Continent Weight: 107 lb (48.5 kg) Height:  5\' 4"  (162.6 cm)  BEHAVIORAL SYMPTOMS/MOOD NEUROLOGICAL BOWEL NUTRITION STATUS      Continent Diet (Regular)  AMBULATORY STATUS COMMUNICATION OF NEEDS Skin   Supervision Verbally Normal                       Personal Care Assistance Level of Assistance  Bathing, Feeding, Dressing Bathing Assistance: Limited assistance Feeding assistance: Independent Dressing Assistance: Limited assistance     Functional Limitations Info  Sight, Hearing, Speech Sight Info: Adequate Hearing Info: Adequate Speech Info: Adequate    SPECIAL CARE FACTORS FREQUENCY                       Contractures Contractures Info: Not present    Additional  Factors Info  Code Status, Allergies Code Status Info: Full Code Allergies Info: Codeine  Ibuprofen   Imdur (Isosorbide Nitrate)           Current Medications (06/22/2023):  This is the current hospital active medication list Current Facility-Administered Medications  Medication Dose Route Frequency Provider Last Rate Last Admin   hydrOXYzine  (ATARAX ) tablet 25 mg  25 mg Oral TID PRN Merdis Stalling, MD       metFORMIN  (GLUCOPHAGE ) tablet 500 mg  500 mg Oral BID WC Merdis Stalling, MD   500 mg at 06/22/23 0755   pantoprazole  (PROTONIX ) EC tablet 20 mg  20 mg Oral Daily Naasz, Hayley N, MD   20 mg at 06/22/23 1033   Current Outpatient Medications  Medication Sig Dispense Refill   acetaminophen  (TYLENOL ) 500 MG tablet Take 1 tablet (500 mg total) by mouth every 6 (six) hours as needed. 30 tablet 0   albuterol  (VENTOLIN  HFA) 108 (90 Base) MCG/ACT inhaler Inhale 1-2 puffs into the lungs every 6 (six) hours as needed for wheezing or shortness of breath. 18 g 0   amLODipine  (NORVASC ) 10 MG tablet Take 1 tablet (10 mg total) by mouth daily. 30 tablet 3   chlorthalidone  (HYGROTON ) 25 MG tablet TAKE 1 TABLET (25 MG TOTAL) BY MOUTH DAILY. 30 tablet 0   diclofenac  Sodium (VOLTAREN ) 1 % GEL Apply 4 g topically 4 (four) times daily. 350 g 0   diphenhydrAMINE  (BENADRYL ) 25 MG tablet  Take 1 tablet (25 mg total) by mouth every 6 (six) hours as needed for itching or allergies. 30 tablet 0   gabapentin  (NEURONTIN ) 100 MG capsule Take 1 capsule (100 mg total) by mouth 3 (three) times daily as needed. 30 capsule 1   hydrocortisone  cream 1 % Apply to affected area 2 times daily 15 g 0   hydrOXYzine  (ATARAX ) 25 MG tablet Take 1 tablet (25 mg total) by mouth every 8 (eight) hours as needed. 12 tablet 0   lidocaine  (LIDODERM ) 5 % Place 1 patch onto the skin daily. Remove & Discard patch within 12 hours or as directed by MD 30 patch 0   lidocaine  (LIDODERM ) 5 % Place 1 patch onto the skin daily. Remove & Discard  patch within 12 hours or as directed by MD 15 patch 0   lidocaine  (LIDODERM ) 5 % Place 1 patch onto the skin daily. Remove & Discard patch within 12 hours or as directed by MD 30 patch 0   loratadine  (CLARITIN ) 10 MG tablet Take 1 tablet (10 mg total) by mouth daily. 30 tablet 0   metFORMIN  (GLUCOPHAGE ) 500 MG tablet Take 1 tablet (500 mg total) by mouth 2 (two) times daily with a meal. 180 tablet 3   methocarbamol  (ROBAXIN ) 500 MG tablet Take 1 tablet (500 mg total) by mouth 2 (two) times daily. 20 tablet 0   metoprolol  succinate (TOPROL -XL) 25 MG 24 hr tablet Take 1 tablet (25 mg total) by mouth daily. 30 tablet 2   naproxen  (NAPROSYN ) 375 MG tablet Take 1 tablet (375 mg total) by mouth 2 (two) times daily. 20 tablet 0   pantoprazole  (PROTONIX ) 20 MG tablet Take 1 tablet (20 mg total) by mouth daily. 30 tablet 0   triamcinolone  cream (KENALOG ) 0.1 % Apply 1 Application topically 2 (two) times daily. Apply to rash 30 g 0     Discharge Medications: Please see discharge summary for a list of discharge medications.  Relevant Imaging Results:  Relevant Lab Results:   Additional Information SSN: [id number]  [place], [place]

## 2023-06-22 NOTE — ED Notes (Signed)
 Patient was provided with cole slaw and cornbread

## 2023-06-23 DIAGNOSIS — R52 Pain, unspecified: Secondary | ICD-10-CM | POA: Diagnosis not present

## 2023-06-23 LAB — CBG MONITORING, ED: Glucose-Capillary: 103 mg/dL — ABNORMAL HIGH (ref 70–99)

## 2023-06-23 NOTE — Progress Notes (Addendum)
 1:55pm: CSW returned call to [facility] who states he has not been able to make contact with her sister. Doris states patient cannot come to her house.  CSW provided RN with address.  1pm: Per RN, patient wants to go to her daughter Jean's house but doesn't know the address.  CSW spoke with patient's daughter Arzella Laurence to obtain Jean's address. Doris states the address is [address] [place], [place]. Doris states she has attempted to contact Benigno Brakeman without success.  11am: CSW spoke with MD who states he has spoken with Dr. Synetta Eves and patient is to be discharged home at this time.  9:50am: CSW spoke with patient at bedside to discuss her discharge plan. Patient was able to tell CSW her full name, her DOB, and full address. Patient unable to tell CSW the current day of the week or date. Patient apologetic to answering questions incorrectly. Patient states she does not want to go anywhere but home. Patient states she is not agreeable for CSW to place her anywhere. Patient states she wants to travel home via cab. Patient states she has two daughters Arzella Laurence and Benigno Brakeman, and one granddaughter that live here in [place].  CSW spoke with Dr. Gordon Latus regarding patient. MD to speak with ED Leadership prior to proceeding with discharge planning.  Shepard Dicker, MSW, LCSW Transitions of Care  Clinical Social Worker II [phone]
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
## 2023-06-23 NOTE — ED Provider Notes (Signed)
 Stable overnight.  Awaiting placement - TOC assisting.   Arvilla Birmingham, MD 06/23/23 [phone]

## 2023-06-28 ENCOUNTER — Other Ambulatory Visit: Payer: Self-pay

## 2023-06-28 ENCOUNTER — Emergency Department (HOSPITAL_COMMUNITY)
Admission: EM | Admit: 2023-06-28 | Discharge: 2023-06-29 | Discharge status: Against Medical Advice | Attending: Emergency Medicine | Admitting: Emergency Medicine

## 2023-06-28 ENCOUNTER — Encounter (HOSPITAL_COMMUNITY): Payer: Self-pay

## 2023-06-28 DIAGNOSIS — Z5321 Procedure and treatment not carried out due to patient leaving prior to being seen by health care provider: Secondary | ICD-10-CM | POA: Insufficient documentation

## 2023-06-28 DIAGNOSIS — M2550 Pain in unspecified joint: Secondary | ICD-10-CM | POA: Diagnosis present

## 2023-06-28 NOTE — ED Triage Notes (Signed)
 Pt here for joint pain and states she can not take tylenol . Tylenol  does not help her pain.

## 2023-06-29 NOTE — ED Notes (Signed)
 Pt seen walking out the ED after speaking to registration.

## 2023-06-30 ENCOUNTER — Emergency Department (HOSPITAL_COMMUNITY)
Admission: EM | Admit: 2023-06-30 | Discharge: 2023-07-01 | Discharge status: Against Medical Advice | Attending: Emergency Medicine | Admitting: Emergency Medicine

## 2023-06-30 ENCOUNTER — Other Ambulatory Visit: Payer: Self-pay

## 2023-06-30 ENCOUNTER — Encounter (HOSPITAL_COMMUNITY): Payer: Self-pay

## 2023-06-30 DIAGNOSIS — Z79899 Other long term (current) drug therapy: Secondary | ICD-10-CM | POA: Diagnosis not present

## 2023-06-30 DIAGNOSIS — Z5321 Procedure and treatment not carried out due to patient leaving prior to being seen by health care provider: Secondary | ICD-10-CM | POA: Diagnosis not present

## 2023-06-30 DIAGNOSIS — I10 Essential (primary) hypertension: Secondary | ICD-10-CM | POA: Diagnosis not present

## 2023-06-30 DIAGNOSIS — M542 Cervicalgia: Secondary | ICD-10-CM | POA: Diagnosis not present

## 2023-06-30 DIAGNOSIS — E119 Type 2 diabetes mellitus without complications: Secondary | ICD-10-CM | POA: Diagnosis not present

## 2023-06-30 DIAGNOSIS — M7918 Myalgia, other site: Secondary | ICD-10-CM | POA: Insufficient documentation

## 2023-06-30 DIAGNOSIS — Z7984 Long term (current) use of oral hypoglycemic drugs: Secondary | ICD-10-CM | POA: Diagnosis not present

## 2023-06-30 DIAGNOSIS — G8929 Other chronic pain: Secondary | ICD-10-CM | POA: Diagnosis present

## 2023-06-30 DIAGNOSIS — M2550 Pain in unspecified joint: Secondary | ICD-10-CM | POA: Insufficient documentation
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
## 2023-07-01 ENCOUNTER — Other Ambulatory Visit: Payer: Self-pay

## 2023-07-01 ENCOUNTER — Emergency Department (HOSPITAL_COMMUNITY)
Admission: EM | Admit: 2023-07-01 | Discharge: 2023-07-02 | Disposition: A | Discharge status: Home / Self Care | Source: Home / Self Care | Attending: Emergency Medicine | Admitting: Emergency Medicine

## 2023-07-01 ENCOUNTER — Encounter (HOSPITAL_COMMUNITY): Payer: Self-pay

## 2023-07-01 DIAGNOSIS — E119 Type 2 diabetes mellitus without complications: Secondary | ICD-10-CM | POA: Insufficient documentation

## 2023-07-01 DIAGNOSIS — I10 Essential (primary) hypertension: Secondary | ICD-10-CM | POA: Insufficient documentation

## 2023-07-01 DIAGNOSIS — Z79899 Other long term (current) drug therapy: Secondary | ICD-10-CM | POA: Insufficient documentation

## 2023-07-01 DIAGNOSIS — Z7984 Long term (current) use of oral hypoglycemic drugs: Secondary | ICD-10-CM | POA: Insufficient documentation

## 2023-07-01 DIAGNOSIS — G8929 Other chronic pain: Secondary | ICD-10-CM | POA: Insufficient documentation

## 2023-07-01 DIAGNOSIS — M542 Cervicalgia: Secondary | ICD-10-CM | POA: Insufficient documentation

## 2023-07-01 DIAGNOSIS — M7918 Myalgia, other site: Secondary | ICD-10-CM | POA: Diagnosis not present
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
## 2023-07-02 MED ORDER — ACETAMINOPHEN 325 MG PO TABS
650.0000 mg | ORAL_TABLET | ORAL | Status: AC
  Administered 2023-07-02: 650 mg via ORAL
  Filled 2023-07-02: qty 2

## 2023-07-02 NOTE — Discharge Instructions (Signed)
 Please follow-up with your PCP.  Please continue taking Tylenol  for pain.
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
## 2023-07-03 ENCOUNTER — Emergency Department (HOSPITAL_COMMUNITY)
Admission: EM | Admit: 2023-07-03 | Discharge: 2023-07-04 | Disposition: A | Discharge status: Home / Self Care | Attending: Emergency Medicine | Admitting: Emergency Medicine

## 2023-07-03 ENCOUNTER — Other Ambulatory Visit: Payer: Self-pay

## 2023-07-03 ENCOUNTER — Encounter (HOSPITAL_COMMUNITY): Payer: Self-pay | Admitting: *Deleted

## 2023-07-03 DIAGNOSIS — Z79899 Other long term (current) drug therapy: Secondary | ICD-10-CM | POA: Diagnosis not present

## 2023-07-03 DIAGNOSIS — Z7901 Long term (current) use of anticoagulants: Secondary | ICD-10-CM | POA: Diagnosis not present

## 2023-07-03 DIAGNOSIS — G8929 Other chronic pain: Secondary | ICD-10-CM | POA: Diagnosis not present

## 2023-07-03 DIAGNOSIS — I10 Essential (primary) hypertension: Secondary | ICD-10-CM | POA: Insufficient documentation

## 2023-07-03 DIAGNOSIS — M542 Cervicalgia: Secondary | ICD-10-CM | POA: Diagnosis present

## 2023-07-03 DIAGNOSIS — E119 Type 2 diabetes mellitus without complications: Secondary | ICD-10-CM | POA: Diagnosis not present

## 2023-07-03 NOTE — ED Provider Triage Note (Addendum)
 Emergency Medicine Provider Triage Evaluation Note  [name] , a 78 y.o. female  was evaluated in triage.  Pt complains of body pain.  Has been ongoing for several weeks.  Also reporting neck pain.  Was here yesterday for evaluation.  Blood pressure noted to be elevated during this encounter.  She states she is compliant in taking her blood pressure medications.  Review of Systems  Positive: See above Negative: See above  Physical Exam  BP (!) 204/79 (BP Location: Right Arm)   Pulse 61   Temp 98.3 F (36.8 C)   Resp 13   SpO2 98%  Gen:   Awake, no distress   Resp:  Normal effort  MSK:   Moves extremities without difficulty  Other:  Systolic >200s checked multiple times in both arms  Medical Decision Making  Medically screening exam initiated at 9:17 PM.  Appropriate orders placed.  Tracye Szuch was informed that the remainder of the evaluation will be completed by another provider, this initial triage assessment does not replace that evaluation, and the importance of remaining in the ED until their evaluation is complete.  Work up started   Janalee Mcmurray, PA-C 07/03/23 2118    Fabrizio Filip K, PA-C 07/03/23 2119

## 2023-07-03 NOTE — ED Triage Notes (Signed)
 Tonight the pt is c/o body pain she is here almost every night

## 2023-07-04 MED ORDER — ACETAMINOPHEN 325 MG PO TABS
650.0000 mg | ORAL_TABLET | Freq: Once | ORAL | Status: AC
  Administered 2023-07-04: 650 mg via ORAL
  Filled 2023-07-04: qty 2

## 2023-07-04 NOTE — Discharge Instructions (Signed)
Please follow up with your primary care provider for further evaluation.
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
## 2023-07-05 ENCOUNTER — Other Ambulatory Visit: Payer: Self-pay

## 2023-07-05 ENCOUNTER — Emergency Department (HOSPITAL_COMMUNITY)
Admission: EM | Admit: 2023-07-05 | Discharge: 2023-07-06 | Disposition: A | Discharge status: Home / Self Care | Attending: Emergency Medicine | Admitting: Emergency Medicine

## 2023-07-05 ENCOUNTER — Encounter (HOSPITAL_COMMUNITY): Payer: Self-pay | Admitting: *Deleted

## 2023-07-05 DIAGNOSIS — L299 Pruritus, unspecified: Secondary | ICD-10-CM | POA: Insufficient documentation

## 2023-07-05 DIAGNOSIS — Z5321 Procedure and treatment not carried out due to patient leaving prior to being seen by health care provider: Secondary | ICD-10-CM | POA: Diagnosis not present

## 2023-07-05 DIAGNOSIS — M7918 Myalgia, other site: Secondary | ICD-10-CM | POA: Diagnosis present

## 2023-07-05 NOTE — ED Triage Notes (Signed)
 Tonight the pt is c/o body pain an every night occurance

## 2023-07-06 ENCOUNTER — Other Ambulatory Visit: Payer: Self-pay

## 2023-07-06 ENCOUNTER — Encounter (HOSPITAL_COMMUNITY): Payer: Self-pay | Admitting: *Deleted

## 2023-07-06 ENCOUNTER — Emergency Department (HOSPITAL_COMMUNITY)
Admission: EM | Admit: 2023-07-06 | Discharge: 2023-07-07 | Discharge status: Against Medical Advice | Attending: Emergency Medicine | Admitting: Emergency Medicine

## 2023-07-06 DIAGNOSIS — L299 Pruritus, unspecified: Secondary | ICD-10-CM | POA: Diagnosis not present

## 2023-07-06 DIAGNOSIS — Z5321 Procedure and treatment not carried out due to patient leaving prior to being seen by health care provider: Secondary | ICD-10-CM | POA: Insufficient documentation

## 2023-07-06 DIAGNOSIS — M7918 Myalgia, other site: Secondary | ICD-10-CM | POA: Insufficient documentation

## 2023-07-06 MED ORDER — AMLODIPINE BESYLATE 5 MG PO TABS
10.0000 mg | ORAL_TABLET | Freq: Every day | ORAL | Status: DC
  Administered 2023-07-06: 10 mg via ORAL
  Filled 2023-07-06: qty 2

## 2023-07-06 MED ORDER — HYDROXYZINE HCL 25 MG PO TABS
25.0000 mg | ORAL_TABLET | Freq: Three times a day (TID) | ORAL | Status: DC | PRN
  Filled 2023-07-06: qty 1

## 2023-07-06 NOTE — ED Triage Notes (Signed)
 Todays complaint is body pain
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
## 2023-07-07 ENCOUNTER — Other Ambulatory Visit: Payer: Self-pay

## 2023-07-07 ENCOUNTER — Emergency Department (HOSPITAL_COMMUNITY)
Admission: EM | Admit: 2023-07-07 | Discharge: 2023-07-07 | Disposition: A | Discharge status: Home / Self Care | Attending: Emergency Medicine | Admitting: Emergency Medicine

## 2023-07-07 ENCOUNTER — Encounter (HOSPITAL_COMMUNITY): Payer: Self-pay

## 2023-07-07 DIAGNOSIS — R21 Rash and other nonspecific skin eruption: Secondary | ICD-10-CM | POA: Diagnosis present

## 2023-07-07 MED ORDER — TRIAMCINOLONE ACETONIDE 0.1 % EX CREA: 1.0000 | TOPICAL_CREAM | Freq: Two times a day (BID) | CUTANEOUS | 0 refills | Status: DC

## 2023-07-07 MED ORDER — LORATADINE 10 MG PO TABS
10.0000 mg | ORAL_TABLET | Freq: Once | ORAL | Status: AC
  Administered 2023-07-07: 10 mg via ORAL
  Filled 2023-07-07: qty 1

## 2023-07-07 NOTE — ED Triage Notes (Signed)
 Pt arrived from her discharge to have the rash on her back checked out. Pt states that it is itching and she wished that someone would give her some medication for it.

## 2023-07-07 NOTE — ED Provider Notes (Signed)
 [facility] Emergency Department Provider Note MRN:  [id number]  Arrival date & time: 07/07/23     Chief Complaint   Rash   History of Present Illness   [name] is a 78 y.o. year-old female presents to the ED with chief complaint of rash.  Seen recently for the same by me.  She states that she does not want to follow-up with a primary care doctor because she prefers to come to the emergency department.  She denies any fevers or chills.  She denies any other associated new symptoms.  History provided by patient.   Review of Systems  Pertinent positive and negative review of systems noted in HPI.    Physical Exam   Vitals:   07/07/23 2025  BP: (!) 198/66  Pulse: (!) 56  Resp: 18  Temp: 98.4 F (36.9 C)  SpO2: 100%    CONSTITUTIONAL: Nontoxic-appearing, NAD NEURO:  Alert and oriented x 3, CN 3-12 grossly intact EYES:  eyes equal and reactive ENT/NECK:  Supple, no stridor  CARDIO: Mildly bradycardic, appears well-perfused  PULM:  No respiratory distress,  GI/GU:  non-distended,  MSK/SPINE:  No gross deformities, no edema, moves all extremities  SKIN: Flesh-colored macular rash on back with some excoriations, atraumatic   *Additional and/or pertinent findings included in MDM below  Diagnostic and Interventional Summary    EKG Interpretation Date/Time:    Ventricular Rate:    PR Interval:    QRS Duration:    QT Interval:    QTC Calculation:   R Axis:      Text Interpretation:         Labs Reviewed - No data to display  No orders to display    Medications  loratadine  (CLARITIN ) tablet 10 mg (has no administration in time range)     Procedures  /  Critical Care Procedures  ED Course and Medical Decision Making  I have reviewed the triage vital signs, the nursing notes, and pertinent available records from the EMR.  Social Determinants Affecting Complexity of Care: Patient has no clinically significant social determinants  affecting this chief complaint..   ED Course:    Medical Decision Making Patient here with persistent rash and itching on her back.  She has some associated excoriations.  This looks very similar to what she has had in the past.  I continue to refer her to primary care doctor.  I do not feel that she needs any emergent workup tonight.    Risk OTC drugs. Prescription drug management.         Consultants: No consultations were needed in caring for this patient.   Treatment and Plan: Emergency department workup does not suggest an emergent condition requiring admission or immediate intervention beyond  what has been performed at this time. The patient is safe for discharge and has  been instructed to return immediately for worsening symptoms, change in  symptoms or any other concerns    Final Clinical Impressions(s) / ED Diagnoses     ICD-10-CM   1. Rash  R21       ED Discharge Orders          Ordered    triamcinolone  cream (KENALOG ) 0.1 %  2 times daily        07/07/23 2243              Discharge Instructions Discussed with and Provided to Patient:     Discharge Instructions      It is  imperative that you follow up with a primary care doctor.       Sherel Dikes, PA-C 07/07/23 2248    Albertus Hughs, DO 07/07/23 2255

## 2023-07-07 NOTE — ED Notes (Signed)
 Patient left.

## 2023-07-07 NOTE — Discharge Instructions (Signed)
 It is imperative that you follow up with a primary care doctor.

## 2023-07-09 ENCOUNTER — Encounter (HOSPITAL_COMMUNITY): Payer: Self-pay

## 2023-07-09 ENCOUNTER — Other Ambulatory Visit: Payer: Self-pay

## 2023-07-09 ENCOUNTER — Emergency Department (HOSPITAL_COMMUNITY)
Admission: EM | Admit: 2023-07-09 | Discharge: 2023-07-10 | Disposition: A | Discharge status: Home / Self Care | Attending: Emergency Medicine | Admitting: Emergency Medicine

## 2023-07-09 DIAGNOSIS — G8929 Other chronic pain: Secondary | ICD-10-CM | POA: Insufficient documentation

## 2023-07-09 DIAGNOSIS — M791 Myalgia, unspecified site: Secondary | ICD-10-CM | POA: Insufficient documentation

## 2023-07-09 DIAGNOSIS — Z5321 Procedure and treatment not carried out due to patient leaving prior to being seen by health care provider: Secondary | ICD-10-CM | POA: Diagnosis not present

## 2023-07-09 DIAGNOSIS — M7918 Myalgia, other site: Secondary | ICD-10-CM | POA: Diagnosis present
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
## 2023-07-10 ENCOUNTER — Telehealth: Payer: Self-pay | Admitting: *Deleted

## 2023-07-10 ENCOUNTER — Emergency Department (HOSPITAL_COMMUNITY)
Admission: EM | Admit: 2023-07-10 | Discharge: 2023-07-11 | Discharge status: Against Medical Advice | Attending: Emergency Medicine | Admitting: Emergency Medicine

## 2023-07-10 ENCOUNTER — Encounter (HOSPITAL_COMMUNITY): Payer: Self-pay | Admitting: Emergency Medicine

## 2023-07-10 DIAGNOSIS — Z5321 Procedure and treatment not carried out due to patient leaving prior to being seen by health care provider: Secondary | ICD-10-CM | POA: Insufficient documentation

## 2023-07-10 DIAGNOSIS — M7918 Myalgia, other site: Secondary | ICD-10-CM | POA: Insufficient documentation

## 2023-07-10 DIAGNOSIS — M791 Myalgia, unspecified site: Secondary | ICD-10-CM | POA: Diagnosis not present

## 2023-07-10 MED ORDER — ACETAMINOPHEN 500 MG PO TABS
1000.0000 mg | ORAL_TABLET | Freq: Once | ORAL | Status: AC
  Administered 2023-07-10: 1000 mg via ORAL
  Filled 2023-07-10: qty 2

## 2023-07-10 NOTE — ED Notes (Signed)
 ..  The patient is A&OX4, ambulatory at d/c with independent steady gait, NAD. Pt verbalized understanding of d/c instructions and follow up care.
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
## 2023-07-10 NOTE — Discharge Instructions (Signed)
 Can continue taking tylenol .  You can take extra strength. Do not exceed more than 3000mg  daily. Follow-up with your doctor. Return here for new concerns.

## 2023-07-10 NOTE — ED Notes (Signed)
 Patient left AMA.
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
## 2023-07-11 ENCOUNTER — Emergency Department (HOSPITAL_COMMUNITY)
Admission: EM | Admit: 2023-07-11 | Discharge: 2023-07-11 | Discharge status: Against Medical Advice | Attending: Emergency Medicine | Admitting: Emergency Medicine

## 2023-07-11 ENCOUNTER — Other Ambulatory Visit: Payer: Self-pay

## 2023-07-11 DIAGNOSIS — R519 Headache, unspecified: Secondary | ICD-10-CM | POA: Diagnosis present

## 2023-07-11 DIAGNOSIS — Z5321 Procedure and treatment not carried out due to patient leaving prior to being seen by health care provider: Secondary | ICD-10-CM | POA: Insufficient documentation

## 2023-07-11 NOTE — ED Triage Notes (Signed)
 Pt. Stated, [name] had a headache for a week.
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
## 2023-07-11 NOTE — ED Notes (Signed)
 Patient decided to leave.

## 2023-07-13 ENCOUNTER — Emergency Department (HOSPITAL_COMMUNITY)
Admission: EM | Admit: 2023-07-13 | Discharge: 2023-07-14 | Disposition: A | Discharge status: Home / Self Care | Attending: Emergency Medicine | Admitting: Emergency Medicine

## 2023-07-13 ENCOUNTER — Encounter (HOSPITAL_COMMUNITY): Payer: Self-pay

## 2023-07-13 ENCOUNTER — Other Ambulatory Visit: Payer: Self-pay

## 2023-07-13 ENCOUNTER — Emergency Department (HOSPITAL_COMMUNITY)
Admission: EM | Admit: 2023-07-13 | Discharge: 2023-07-13 | Disposition: A | Discharge status: Home / Self Care | Attending: Emergency Medicine | Admitting: Emergency Medicine

## 2023-07-13 DIAGNOSIS — Z8673 Personal history of transient ischemic attack (TIA), and cerebral infarction without residual deficits: Secondary | ICD-10-CM | POA: Diagnosis not present

## 2023-07-13 DIAGNOSIS — I10 Essential (primary) hypertension: Secondary | ICD-10-CM | POA: Insufficient documentation

## 2023-07-13 DIAGNOSIS — M791 Myalgia, unspecified site: Secondary | ICD-10-CM | POA: Diagnosis present

## 2023-07-13 DIAGNOSIS — E119 Type 2 diabetes mellitus without complications: Secondary | ICD-10-CM | POA: Insufficient documentation

## 2023-07-13 DIAGNOSIS — Z7984 Long term (current) use of oral hypoglycemic drugs: Secondary | ICD-10-CM | POA: Diagnosis not present

## 2023-07-13 DIAGNOSIS — G8929 Other chronic pain: Secondary | ICD-10-CM | POA: Diagnosis present

## 2023-07-13 DIAGNOSIS — R030 Elevated blood-pressure reading, without diagnosis of hypertension: Secondary | ICD-10-CM | POA: Insufficient documentation

## 2023-07-13 DIAGNOSIS — R52 Pain, unspecified: Secondary | ICD-10-CM

## 2023-07-13 DIAGNOSIS — Z79899 Other long term (current) drug therapy: Secondary | ICD-10-CM | POA: Diagnosis not present

## 2023-07-13 MED ORDER — HYDRALAZINE HCL 10 MG PO TABS
20.0000 mg | ORAL_TABLET | Freq: Once | ORAL | Status: AC
  Administered 2023-07-13: 20 mg via ORAL
  Filled 2023-07-13: qty 2

## 2023-07-13 MED ORDER — LIDOCAINE 5 % EX PTCH
1.0000 | MEDICATED_PATCH | CUTANEOUS | Status: DC
  Administered 2023-07-13: 1 via TRANSDERMAL
  Filled 2023-07-13: qty 1

## 2023-07-13 MED ORDER — LIDOCAINE 5 % EX PTCH: 1.0000 | MEDICATED_PATCH | CUTANEOUS | 0 refills | Status: DC
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
## 2023-07-13 NOTE — ED Provider Triage Note (Signed)
 Emergency Medicine Provider Triage Evaluation Note  [name] , a 78 y.o. female  was evaluated in triage.  Pt complains of pain all over. Notably hypertensive to 200 systolic. Asymptomatic otherwise and standard complaints have not changed from 4 hours ago when she was discharged. Patient reports she took her meds this morning  Review of Systems  Positive: Pain everywhere Negative: fever  Physical Exam  BP (!) 210/72 (BP Location: Right Arm)   Pulse (!) 58   Temp 98.4 F (36.9 C) (Oral)   Resp 18   SpO2 100%  Gen:   Awake, no distress   Resp:  Normal effort  MSK:   Moves extremities without difficulty  Other:    Medical Decision Making  Medically screening exam initiated at 7:56 PM.  Appropriate orders placed.  [name] was informed that the remainder of the evaluation will be completed by another provider, this initial triage assessment does not replace that evaluation, and the importance of remaining in the ED until their evaluation is complete.  Notably hypertensive. 20 mg PO hydralazine  given. Patient never left the ER from earlier. She just checked back in;   [name], [name], [place] 07/13/23 8040

## 2023-07-13 NOTE — Discharge Instructions (Signed)
 Take the lidocaine  patch to help with your pain.  Continue with over-the-counter medications as tolerated.  I recommend following up with a primary care doctor for further treatment

## 2023-07-13 NOTE — ED Triage Notes (Signed)
 Patient reports generalized body aches and neck pain for several days , fell 2 weeks ago , ambulatory /no injury , respirations unlabored.
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
## 2023-07-14 DIAGNOSIS — G8929 Other chronic pain: Secondary | ICD-10-CM | POA: Diagnosis not present

## 2023-07-14 MED ORDER — AMLODIPINE BESYLATE 5 MG PO TABS
10.0000 mg | ORAL_TABLET | Freq: Once | ORAL | Status: AC
  Administered 2023-07-14: 10 mg via ORAL
  Filled 2023-07-14: qty 2

## 2023-07-14 MED ORDER — HYDRALAZINE HCL 20 MG/ML IJ SOLN
10.0000 mg | Freq: Once | INTRAMUSCULAR | Status: AC
  Administered 2023-07-14: 10 mg via INTRAMUSCULAR
  Filled 2023-07-14: qty 1

## 2023-07-14 NOTE — ED Notes (Signed)
 Improvement noted to BP after medication administration

## 2023-07-14 NOTE — ED Provider Notes (Signed)
 [place] EMERGENCY DEPARTMENT AT [facility] Provider Note   CSN: [id number] Arrival date & time: 07/13/23  8082     Patient presents with: Generalized Body Aches   [name] is a 78 y.o. female.   The history is provided by the patient.  Illness Location:  Body Quality:  Pain Severity:  Moderate Onset quality:  Gradual Timing:  Constant Progression:  Unchanged Chronicity:  Chronic Context:  Fall a long time ago Relieved by:  Nothing Associated symptoms: no fever, no rhinorrhea and no shortness of breath        Prior to Admission medications   Medication Sig Start Date End Date Taking? Authorizing Provider  acetaminophen  (TYLENOL ) 500 MG tablet Take 1 tablet (500 mg total) by mouth every 6 (six) hours as needed. 05/31/23   Bauer, Collin S, PA-C  albuterol  (VENTOLIN  HFA) 108 (90 Base) MCG/ACT inhaler Inhale 1-2 puffs into the lungs every 6 (six) hours as needed for wheezing or shortness of breath. 04/22/20   Myrna Camelia HERO, NP  amLODipine  (NORVASC ) 10 MG tablet Take 1 tablet (10 mg total) by mouth daily. 01/23/23   Roemhildt, Lorin T, PA-C  chlorthalidone  (HYGROTON ) 25 MG tablet TAKE 1 TABLET (25 MG TOTAL) BY MOUTH DAILY. 11/20/21 11/20/22  Paseda, Folashade R, FNP  diclofenac  Sodium (VOLTAREN ) 1 % GEL Apply 4 g topically 4 (four) times daily. 05/28/23   Smoot, Lauraine LABOR, PA-C  diphenhydrAMINE  (BENADRYL ) 25 MG tablet Take 1 tablet (25 mg total) by mouth every 6 (six) hours as needed for itching or allergies. 10/03/22   [name], PA-C  gabapentin  (NEURONTIN ) 100 MG capsule Take 1 capsule (100 mg total) by mouth 3 (three) times daily as needed. 04/30/23   Bero, Michael M, MD  hydrocortisone  cream 1 % Apply to affected area 2 times daily 10/22/22   Jarold Olam HERO, PA-C  hydrOXYzine  (ATARAX ) 25 MG tablet Take 1 tablet (25 mg total) by mouth every 8 (eight) hours as needed. 11/23/22   Henderly, Britni A, PA-C  lidocaine  (LIDODERM ) 5 % Place 1 patch onto the skin daily. Remove  & Discard patch within 12 hours or as directed by MD 07/13/23   Randol Simmonds, MD  loratadine  (CLARITIN ) 10 MG tablet Take 1 tablet (10 mg total) by mouth daily. 04/06/23   Barrett, Warren SAILOR, PA-C  metFORMIN  (GLUCOPHAGE ) 500 MG tablet Take 1 tablet (500 mg total) by mouth 2 (two) times daily with a meal. 11/03/22 11/03/23  Lang Norleen POUR, PA-C  methocarbamol  (ROBAXIN ) 500 MG tablet Take 1 tablet (500 mg total) by mouth 2 (two) times daily. 03/19/23   Ula Prentice SAUNDERS, MD  metoprolol  succinate (TOPROL -XL) 25 MG 24 hr tablet Take 1 tablet (25 mg total) by mouth daily. 06/18/23   Melvenia Motto, MD  naproxen  (NAPROSYN ) 375 MG tablet Take 1 tablet (375 mg total) by mouth 2 (two) times daily. 03/17/23   Henderly, Britni A, PA-C  pantoprazole  (PROTONIX ) 20 MG tablet Take 1 tablet (20 mg total) by mouth daily. 10/10/21   Dean Clarity, MD  triamcinolone  cream (KENALOG ) 0.1 % Apply 1 Application topically 2 (two) times daily. 07/07/23   Vicky Charleston, PA-C    Allergies: Codeine, Ibuprofen , and Imdur [isosorbide nitrate]    Review of Systems  Constitutional:  Negative for fever.  HENT:  Negative for rhinorrhea.   Respiratory:  Negative for shortness of breath.   Musculoskeletal:  Positive for arthralgias.  All other systems reviewed and are negative.   Updated Vital Signs  BP (!) 168/63   Pulse 65   Temp 98.5 F (36.9 C)   Resp 16   SpO2 100%   Physical Exam Vitals and nursing note reviewed.  Constitutional:      General: She is not in acute distress.    Appearance: Normal appearance. She is well-developed.  HENT:     Head: Normocephalic and atraumatic.     Nose: Nose normal.   Eyes:     Pupils: Pupils are equal, round, and reactive to light.    Cardiovascular:     Rate and Rhythm: Normal rate and regular rhythm.     Pulses: Normal pulses.     Heart sounds: Normal heart sounds.  Pulmonary:     Effort: Pulmonary effort is normal. No respiratory distress.     Breath sounds: Normal breath  sounds.  Abdominal:     General: Bowel sounds are normal. There is no distension.     Palpations: Abdomen is soft.     Tenderness: There is no abdominal tenderness. There is no guarding or rebound.   Musculoskeletal:        General: Normal range of motion.     Cervical back: Neck supple.   Skin:    General: Skin is warm and dry.     Capillary Refill: Capillary refill takes less than 2 seconds.     Findings: No erythema or rash.   Neurological:     General: No focal deficit present.     Mental Status: She is alert.     Deep Tendon Reflexes: Reflexes normal.   Psychiatric:        Mood and Affect: Mood normal.     (all labs ordered are listed, but only abnormal results are displayed) Labs Reviewed - No data to display  EKG: None  Radiology: No results found.   Procedures   Medications Ordered in the ED  hydrALAZINE  (APRESOLINE ) tablet 20 mg (20 mg Oral Given 07/13/23 2011)  hydrALAZINE  (APRESOLINE ) injection 10 mg (10 mg Intramuscular Given 07/14/23 0231)  amLODipine  (NORVASC ) tablet 10 mg (10 mg Oral Given 07/14/23 0231)                                    Medical Decision Making Returned as she has pain   Amount and/or Complexity of Data Reviewed External Data Reviewed: notes.    Details: Previous ED visits reviewed   Risk Prescription drug management. Risk Details: BP lower in the ED.  Likely not taking her medication.  Is asymptomatic.  Treated with good effect.  Stable for discharge.       Final diagnoses:  Complaints of total body pain  Elevated blood pressure reading   No signs of systemic illness or infection. The patient is nontoxic-appearing on exam and vital signs are within normal limits.  I have reviewed the triage vital signs and the nursing notes. Pertinent labs & imaging results that were available during my care of the patient were reviewed by me and considered in my medical decision making (see chart for details). After history, exam, and  medical workup I feel the patient has been appropriately medically screened and is safe for discharge home. Pertinent diagnoses were discussed with the patient. Patient was given return precautions.      ED Discharge Orders     None          Corneshia Hines, MD 07/14/23 9595

## 2023-07-15 ENCOUNTER — Other Ambulatory Visit: Payer: Self-pay

## 2023-07-15 ENCOUNTER — Encounter (HOSPITAL_COMMUNITY): Payer: Self-pay | Admitting: *Deleted

## 2023-07-15 ENCOUNTER — Emergency Department (HOSPITAL_COMMUNITY)
Admission: EM | Admit: 2023-07-15 | Discharge: 2023-07-16 | Disposition: A | Discharge status: Home / Self Care | Attending: Emergency Medicine | Admitting: Emergency Medicine

## 2023-07-15 DIAGNOSIS — G8929 Other chronic pain: Secondary | ICD-10-CM | POA: Diagnosis not present

## 2023-07-15 DIAGNOSIS — M791 Myalgia, unspecified site: Secondary | ICD-10-CM | POA: Diagnosis present

## 2023-07-15 DIAGNOSIS — R52 Pain, unspecified: Secondary | ICD-10-CM | POA: Diagnosis present
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
## 2023-07-15 NOTE — ED Provider Triage Note (Signed)
 Emergency Medicine Provider Triage Evaluation Note  Whitley Patchen , a 78 y.o. female  was evaluated in triage.  Pt complains of neck pain as well as lower leg pain.  She states this has been chronic since previous injury, seen many times for same..  Review of Systems  Positive: Neck pain, right leg pain Negative:   Physical Exam  BP (!) 174/60 (BP Location: Right Arm)   Pulse (!) 54   Temp 98 F (36.7 C) (Oral)   Resp 14   SpO2 100%  Gen:   Awake, no distress   Resp:  Normal effort  MSK:   Moves extremities without difficulty  Other:    Medical Decision Making  Medically screening exam initiated at 8:51 PM.  Appropriate orders placed.  Tiane Szydlowski was informed that the remainder of the evaluation will be completed by another provider, this initial triage assessment does not replace that evaluation, and the importance of remaining in the ED until their evaluation is complete.  Patient seen multiple times for similar complaints, no workup initiated at this time.   [name] BROCKS, [place] 07/15/23 2052

## 2023-07-16 ENCOUNTER — Other Ambulatory Visit: Payer: Self-pay

## 2023-07-16 ENCOUNTER — Emergency Department (HOSPITAL_COMMUNITY)
Admission: EM | Admit: 2023-07-16 | Discharge: 2023-07-16 | Disposition: A | Discharge status: Home / Self Care | Attending: Emergency Medicine | Admitting: Emergency Medicine

## 2023-07-16 ENCOUNTER — Encounter (HOSPITAL_COMMUNITY): Payer: Self-pay

## 2023-07-16 DIAGNOSIS — M791 Myalgia, unspecified site: Secondary | ICD-10-CM | POA: Diagnosis not present

## 2023-07-16 DIAGNOSIS — G8929 Other chronic pain: Secondary | ICD-10-CM

## 2023-07-16 DIAGNOSIS — R52 Pain, unspecified: Secondary | ICD-10-CM | POA: Insufficient documentation

## 2023-07-16 MED ORDER — ACETAMINOPHEN 325 MG PO TABS
650.0000 mg | ORAL_TABLET | Freq: Once | ORAL | Status: AC
  Administered 2023-07-16: 650 mg via ORAL
  Filled 2023-07-16: qty 2

## 2023-07-16 MED ORDER — ACETAMINOPHEN 500 MG PO TABS
1000.0000 mg | ORAL_TABLET | Freq: Once | ORAL | Status: AC
  Administered 2023-07-16: 1000 mg via ORAL
  Filled 2023-07-16: qty 2
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
## 2023-07-17 ENCOUNTER — Emergency Department (HOSPITAL_COMMUNITY)
Admission: EM | Admit: 2023-07-17 | Discharge: 2023-07-18 | Disposition: A | Discharge status: Home / Self Care | Attending: Emergency Medicine | Admitting: Emergency Medicine

## 2023-07-17 ENCOUNTER — Other Ambulatory Visit: Payer: Self-pay

## 2023-07-17 DIAGNOSIS — Z79899 Other long term (current) drug therapy: Secondary | ICD-10-CM | POA: Insufficient documentation

## 2023-07-17 DIAGNOSIS — R531 Weakness: Secondary | ICD-10-CM | POA: Diagnosis present

## 2023-07-17 DIAGNOSIS — Z7984 Long term (current) use of oral hypoglycemic drugs: Secondary | ICD-10-CM | POA: Diagnosis not present

## 2023-07-17 DIAGNOSIS — E119 Type 2 diabetes mellitus without complications: Secondary | ICD-10-CM | POA: Insufficient documentation

## 2023-07-17 DIAGNOSIS — I10 Essential (primary) hypertension: Secondary | ICD-10-CM | POA: Diagnosis not present

## 2023-07-17 DIAGNOSIS — Z765 Malingerer [conscious simulation]: Secondary | ICD-10-CM | POA: Diagnosis not present

## 2023-07-17 DIAGNOSIS — G8929 Other chronic pain: Secondary | ICD-10-CM | POA: Insufficient documentation

## 2023-07-17 DIAGNOSIS — G894 Chronic pain syndrome: Secondary | ICD-10-CM | POA: Diagnosis present

## 2023-07-17 NOTE — ED Triage Notes (Signed)
 Patient states  everything hurts/headache onset today , ambulatory ,respirations unlabored .

## 2023-07-18 ENCOUNTER — Other Ambulatory Visit: Payer: Self-pay

## 2023-07-18 ENCOUNTER — Emergency Department (HOSPITAL_COMMUNITY)
Admission: EM | Admit: 2023-07-18 | Discharge: 2023-07-18 | Disposition: A | Discharge status: Home / Self Care | Source: Home / Self Care

## 2023-07-18 ENCOUNTER — Encounter (HOSPITAL_COMMUNITY): Payer: Self-pay

## 2023-07-18 DIAGNOSIS — Z765 Malingerer [conscious simulation]: Secondary | ICD-10-CM | POA: Insufficient documentation

## 2023-07-18 DIAGNOSIS — G894 Chronic pain syndrome: Secondary | ICD-10-CM | POA: Insufficient documentation

## 2023-07-18 DIAGNOSIS — I10 Essential (primary) hypertension: Secondary | ICD-10-CM | POA: Diagnosis not present

## 2023-07-18 MED ORDER — ACETAMINOPHEN 500 MG PO TABS
1000.0000 mg | ORAL_TABLET | Freq: Once | ORAL | Status: AC
  Administered 2023-07-18: 1000 mg via ORAL
  Filled 2023-07-18: qty 2

## 2023-07-18 NOTE — ED Triage Notes (Signed)
 Pt states everything hurts. Pt ambulatory. Axox4. Pt arrives in c-collar.
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
## 2023-07-18 NOTE — Discharge Instructions (Addendum)
 You were seen today for ongoing chronic pain.  It is important that you establish primary care.  You should not expect to receive aggressive pain medications in the emergency department.  Primary care can also manage ongoing health issues including blood high blood pressure.

## 2023-07-18 NOTE — ED Notes (Signed)
 Patient discharged in stable condition, education materials explained including, follow up, any prescriptions and reasons to return. Patient voiced agreement to education and discharge material.
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
## 2023-07-18 NOTE — Discharge Instructions (Signed)
 Connect with your PCP/Specialist as discussed [email or web address] Call our physician referral line at [phone].  The emergency department cannot address your chronic pain needs you need a primary care doctor.  Please call the number on this page to find one.  I will be here all day so if you return it and should be for a new emergency complaint

## 2023-07-19 ENCOUNTER — Emergency Department (HOSPITAL_COMMUNITY)
Admission: EM | Admit: 2023-07-19 | Discharge: 2023-07-20 | Disposition: A | Discharge status: Home / Self Care | Attending: Emergency Medicine | Admitting: Emergency Medicine

## 2023-07-19 DIAGNOSIS — Z8673 Personal history of transient ischemic attack (TIA), and cerebral infarction without residual deficits: Secondary | ICD-10-CM | POA: Insufficient documentation

## 2023-07-19 DIAGNOSIS — I10 Essential (primary) hypertension: Secondary | ICD-10-CM | POA: Diagnosis not present

## 2023-07-19 DIAGNOSIS — M791 Myalgia, unspecified site: Secondary | ICD-10-CM | POA: Diagnosis present

## 2023-07-19 DIAGNOSIS — G8929 Other chronic pain: Secondary | ICD-10-CM | POA: Insufficient documentation

## 2023-07-19 DIAGNOSIS — E119 Type 2 diabetes mellitus without complications: Secondary | ICD-10-CM | POA: Diagnosis not present

## 2023-07-20 ENCOUNTER — Encounter (HOSPITAL_COMMUNITY): Payer: Self-pay | Admitting: *Deleted

## 2023-07-20 ENCOUNTER — Other Ambulatory Visit: Payer: Self-pay

## 2023-07-20 DIAGNOSIS — M791 Myalgia, unspecified site: Secondary | ICD-10-CM | POA: Diagnosis not present

## 2023-07-20 LAB — COMPREHENSIVE METABOLIC PANEL WITH GFR
ALT: 11 U/L (ref 0–44)
AST: 21 U/L (ref 15–41)
Albumin: 3.6 g/dL (ref 3.5–5.0)
Alkaline Phosphatase: 60 U/L (ref 38–126)
Anion gap: 7 (ref 5–15)
BUN: 16 mg/dL (ref 8–23)
CO2: 29 mmol/L (ref 22–32)
Calcium: 8.9 mg/dL (ref 8.9–10.3)
Chloride: 106 mmol/L (ref 98–111)
Creatinine, Ser: 0.93 mg/dL (ref 0.44–1.00)
GFR, Estimated: >60 mL/min (ref >60)
Glucose, Bld: 103 mg/dL — ABNORMAL HIGH (ref 70–99)
Potassium: 3.3 mmol/L — ABNORMAL LOW (ref 3.5–5.1)
Sodium: 142 mmol/L (ref 135–145)
Total Bilirubin: 0.4 mg/dL (ref 0.0–1.2)
Total Protein: 7.3 g/dL (ref 6.5–8.1)

## 2023-07-20 LAB — CBC WITH DIFFERENTIAL/PLATELET
Abs Immature Granulocytes: 0.02 10*3/uL (ref 0.00–0.07)
Basophils Absolute: 0.1 10*3/uL (ref 0.0–0.1)
Basophils Relative: 1 %
Eosinophils Absolute: 0.2 10*3/uL (ref 0.0–0.5)
Eosinophils Relative: 2 %
HCT: 33.8 % — ABNORMAL LOW (ref 36.0–46.0)
Hemoglobin: 10.8 g/dL — ABNORMAL LOW (ref 12.0–15.0)
Immature Granulocytes: 0 %
Lymphocytes Relative: 23 %
Lymphs Abs: 1.5 10*3/uL (ref 0.7–4.0)
MCH: 29.7 pg (ref 26.0–34.0)
MCHC: 32 g/dL (ref 30.0–36.0)
MCV: 92.9 fL (ref 80.0–100.0)
Monocytes Absolute: 0.5 10*3/uL (ref 0.1–1.0)
Monocytes Relative: 8 %
Neutro Abs: 4.4 10*3/uL (ref 1.7–7.7)
Neutrophils Relative %: 66 %
Platelets: 206 10*3/uL (ref 150–400)
RBC: 3.64 MIL/uL — ABNORMAL LOW (ref 3.87–5.11)
RDW: 12.6 % (ref 11.5–15.5)
WBC: 6.6 10*3/uL (ref 4.0–10.5)
nRBC: 0 % (ref 0.0–0.2)

## 2023-07-20 LAB — TROPONIN I (HIGH SENSITIVITY): Troponin I (High Sensitivity): 8 ng/L (ref <18)

## 2023-07-20 MED ORDER — AMLODIPINE BESYLATE 5 MG PO TABS
10.0000 mg | ORAL_TABLET | Freq: Once | ORAL | Status: AC
  Administered 2023-07-20: 10 mg via ORAL
  Filled 2023-07-20: qty 2
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
## 2023-07-20 NOTE — Discharge Instructions (Signed)
You were evaluated in the Emergency Department and after careful evaluation, we did not find any emergent condition requiring admission or further testing in the hospital.  Your exam/testing today is overall reassuring.  Please return to the Emergency Department if you experience any worsening of your condition.   Thank you for allowing us to be a part of your care. 

## 2023-07-20 NOTE — ED Triage Notes (Signed)
 Pt c/o weakness nightly visit

## 2023-07-20 NOTE — ED Notes (Signed)
 Pt in bed, pt states that she needs 20 dollars for a cab home, states that her legs hurts and she can't take the bus, explained to pt that we didn't have a cab voucher for her and encouraged her to call someone to come get her.  MD notified of blood pressure.

## 2023-07-22 ENCOUNTER — Emergency Department (HOSPITAL_COMMUNITY)
Admission: EM | Admit: 2023-07-22 | Discharge: 2023-07-22 | Disposition: A | Discharge status: Home / Self Care | Attending: Emergency Medicine | Admitting: Emergency Medicine

## 2023-07-22 ENCOUNTER — Other Ambulatory Visit: Payer: Self-pay

## 2023-07-22 ENCOUNTER — Encounter (HOSPITAL_COMMUNITY): Payer: Self-pay

## 2023-07-22 DIAGNOSIS — G894 Chronic pain syndrome: Secondary | ICD-10-CM | POA: Diagnosis not present

## 2023-07-22 DIAGNOSIS — R52 Pain, unspecified: Secondary | ICD-10-CM | POA: Diagnosis present

## 2023-07-22 MED ORDER — LIDOCAINE 5 % EX PTCH
1.0000 | MEDICATED_PATCH | CUTANEOUS | Status: DC
  Administered 2023-07-22: 1 via TRANSDERMAL
  Filled 2023-07-22: qty 1

## 2023-07-22 MED ORDER — ACETAMINOPHEN 500 MG PO TABS
1000.0000 mg | ORAL_TABLET | Freq: Once | ORAL | Status: AC
  Administered 2023-07-22: 1000 mg via ORAL
  Filled 2023-07-22: qty 2

## 2023-07-22 NOTE — ED Provider Notes (Signed)
 [place] EMERGENCY DEPARTMENT AT [facility] Provider Note   CSN: [id number] Arrival date & time: 07/22/23  1844     Patient presents with: generalized pain (Generalized pain, stating I wish they'd give me something stronger than tylenol )   [name] is a 78 y.o. female.   Patient presents with recurrent body pain multiple areas since she fell weeks ago.  No new falls or injuries.  No fevers or chills.  Patient says lidocaine  patch helped her in the past but Tylenol  is not helping very much.  No fevers chills or breathing difficulty no headache or vomiting.  The history is provided by the patient.       Prior to Admission medications   Medication Sig Start Date End Date Taking? Authorizing Provider  acetaminophen  (TYLENOL ) 500 MG tablet Take 1 tablet (500 mg total) by mouth every 6 (six) hours as needed. 05/31/23   Bauer, Collin S, PA-C  albuterol  (VENTOLIN  HFA) 108 (90 Base) MCG/ACT inhaler Inhale 1-2 puffs into the lungs every 6 (six) hours as needed for wheezing or shortness of breath. 04/22/20   Myrna Camelia HERO, NP  amLODipine  (NORVASC ) 10 MG tablet Take 1 tablet (10 mg total) by mouth daily. 01/23/23   Roemhildt, Lorin T, PA-C  chlorthalidone  (HYGROTON ) 25 MG tablet TAKE 1 TABLET (25 MG TOTAL) BY MOUTH DAILY. 11/20/21 11/20/22  Paseda, Folashade R, FNP  diclofenac  Sodium (VOLTAREN ) 1 % GEL Apply 4 g topically 4 (four) times daily. 05/28/23   Smoot, Lauraine LABOR, PA-C  diphenhydrAMINE  (BENADRYL ) 25 MG tablet Take 1 tablet (25 mg total) by mouth every 6 (six) hours as needed for itching or allergies. 10/03/22   [name], PA-C  gabapentin  (NEURONTIN ) 100 MG capsule Take 1 capsule (100 mg total) by mouth 3 (three) times daily as needed. 04/30/23   Bero, Michael M, MD  hydrocortisone  cream 1 % Apply to affected area 2 times daily 10/22/22   Jarold Olam HERO, PA-C  hydrOXYzine  (ATARAX ) 25 MG tablet Take 1 tablet (25 mg total) by mouth every 8 (eight) hours as needed. 11/23/22    Henderly, Britni A, PA-C  lidocaine  (LIDODERM ) 5 % Place 1 patch onto the skin daily. Remove & Discard patch within 12 hours or as directed by MD 07/13/23   Randol Simmonds, MD  loratadine  (CLARITIN ) 10 MG tablet Take 1 tablet (10 mg total) by mouth daily. 04/06/23   Barrett, Warren SAILOR, PA-C  metFORMIN  (GLUCOPHAGE ) 500 MG tablet Take 1 tablet (500 mg total) by mouth 2 (two) times daily with a meal. 11/03/22 11/03/23  Lang Norleen POUR, PA-C  methocarbamol  (ROBAXIN ) 500 MG tablet Take 1 tablet (500 mg total) by mouth 2 (two) times daily. 03/19/23   Ula Prentice SAUNDERS, MD  metoprolol  succinate (TOPROL -XL) 25 MG 24 hr tablet Take 1 tablet (25 mg total) by mouth daily. 06/18/23   Melvenia Motto, MD  naproxen  (NAPROSYN ) 375 MG tablet Take 1 tablet (375 mg total) by mouth 2 (two) times daily. 03/17/23   Henderly, Britni A, PA-C  pantoprazole  (PROTONIX ) 20 MG tablet Take 1 tablet (20 mg total) by mouth daily. 10/10/21   Dean Clarity, MD  triamcinolone  cream (KENALOG ) 0.1 % Apply 1 Application topically 2 (two) times daily. 07/07/23   Vicky Charleston, PA-C    Allergies: Codeine, Ibuprofen , and Imdur [isosorbide nitrate]    Review of Systems  Constitutional:  Negative for chills and fever.  HENT:  Negative for congestion.   Eyes:  Negative for visual disturbance.  Respiratory:  Negative for shortness of breath.   Cardiovascular:  Negative for chest pain.  Gastrointestinal:  Negative for abdominal pain and vomiting.  Genitourinary:  Negative for dysuria and flank pain.  Musculoskeletal:  Positive for arthralgias. Negative for back pain, neck pain and neck stiffness.  Skin:  Negative for rash.  Neurological:  Negative for light-headedness and headaches.    Updated Vital Signs BP (!) 148/54 (BP Location: Right Arm)   Pulse (!) 58   Temp 98 F (36.7 C)   Resp 16   SpO2 100%   Physical Exam Vitals and nursing note reviewed.  Constitutional:      General: She is not in acute distress.    Appearance: She is  well-developed.  HENT:     Head: Normocephalic and atraumatic.     Mouth/Throat:     Mouth: Mucous membranes are moist.   Eyes:     General:        Right eye: No discharge.        Left eye: No discharge.     Conjunctiva/sclera: Conjunctivae normal.   Neck:     Trachea: No tracheal deviation.   Cardiovascular:     Rate and Rhythm: Normal rate.  Pulmonary:     Effort: Pulmonary effort is normal.  Abdominal:     General: There is no distension.     Palpations: Abdomen is soft.     Tenderness: There is no abdominal tenderness. There is no guarding.   Musculoskeletal:        General: Tenderness present. No swelling.     Cervical back: Normal range of motion and neck supple. No rigidity.   Skin:    General: Skin is warm.     Capillary Refill: Capillary refill takes less than 2 seconds.     Findings: No rash.   Neurological:     General: No focal deficit present.     Mental Status: She is alert.     Cranial Nerves: No cranial nerve deficit.   Psychiatric:        Mood and Affect: Mood is not anxious.        Behavior: Behavior is not aggressive.     (all labs ordered are listed, but only abnormal results are displayed) Labs Reviewed - No data to display  EKG: None  Radiology: No results found.   Procedures   Medications Ordered in the ED  lidocaine  (LIDODERM ) 5 % 1 patch (1 patch Transdermal Patch Applied 07/22/23 2156)  acetaminophen  (TYLENOL ) tablet 1,000 mg (1,000 mg Oral Given 07/22/23 2156)                                    Medical Decision Making Risk OTC drugs. Prescription drug management.   Patient presents with recurrent body pain without recent injury or signs of significant trauma or infection.  Medical records reviewed and patient was seen on July 18, 2024, 25th, 24th often for similar.  No evidence of new injuries no indication for emergent imaging.  Tylenol  and lidocaine  patch ordered.  Supportive care discussed.  Chronic pain.     Final  diagnoses:  Chronic pain syndrome    ED Discharge Orders     None          Tonia Chew, MD 07/22/23 2230

## 2023-07-22 NOTE — Discharge Instructions (Signed)
 Use Tylenol  every 4 hours as needed for pain.  Follow-up with local doctor.

## 2023-07-27 ENCOUNTER — Other Ambulatory Visit: Payer: Self-pay

## 2023-07-27 ENCOUNTER — Emergency Department (HOSPITAL_COMMUNITY)
Admission: EM | Admit: 2023-07-27 | Discharge: 2023-07-27 | Disposition: A | Discharge status: Home / Self Care | Attending: Emergency Medicine | Admitting: Emergency Medicine

## 2023-07-27 DIAGNOSIS — Z7984 Long term (current) use of oral hypoglycemic drugs: Secondary | ICD-10-CM | POA: Insufficient documentation

## 2023-07-27 DIAGNOSIS — M542 Cervicalgia: Secondary | ICD-10-CM | POA: Diagnosis present

## 2023-07-27 DIAGNOSIS — I10 Essential (primary) hypertension: Secondary | ICD-10-CM | POA: Diagnosis not present

## 2023-07-27 DIAGNOSIS — M791 Myalgia, unspecified site: Secondary | ICD-10-CM | POA: Diagnosis not present

## 2023-07-27 DIAGNOSIS — E119 Type 2 diabetes mellitus without complications: Secondary | ICD-10-CM | POA: Diagnosis not present

## 2023-07-27 DIAGNOSIS — G8929 Other chronic pain: Secondary | ICD-10-CM | POA: Insufficient documentation

## 2023-07-27 DIAGNOSIS — Z79899 Other long term (current) drug therapy: Secondary | ICD-10-CM | POA: Insufficient documentation

## 2023-07-27 MED ORDER — LIDOCAINE 5 % EX PTCH
3.0000 | MEDICATED_PATCH | CUTANEOUS | Status: DC
  Administered 2023-07-27: 3 via TRANSDERMAL
  Filled 2023-07-27: qty 3

## 2023-07-27 MED ORDER — POLYVINYL ALCOHOL 1.4 % OP SOLN
1.0000 [drp] | Freq: Once | OPHTHALMIC | Status: AC
  Administered 2023-07-27: 1 [drp] via OPHTHALMIC
  Filled 2023-07-27: qty 15

## 2023-07-27 MED ORDER — ACETAMINOPHEN 500 MG PO TABS
1000.0000 mg | ORAL_TABLET | Freq: Once | ORAL | Status: AC
  Administered 2023-07-27: 1000 mg via ORAL
  Filled 2023-07-27: qty 2
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
## 2023-07-27 NOTE — ED Triage Notes (Signed)
 Today she has body pain  frequent pt

## 2023-07-27 NOTE — Discharge Instructions (Addendum)
 I have provided you with the contact information for [facility], please contact their office to establish care for management of your chronic conditions as you do not have a primary care provider.  Continue 1000 mg of Tylenol  as needed for pain, do not exceed 3,000mg  in a 24hr period.  Continue lidocaine  patches as needed for pain.  Continue artificial tears for dry eyes.

## 2023-07-28 ENCOUNTER — Emergency Department (HOSPITAL_COMMUNITY)
Admission: EM | Admit: 2023-07-28 | Discharge: 2023-07-28 | Disposition: A | Discharge status: Home / Self Care | Attending: Emergency Medicine | Admitting: Emergency Medicine

## 2023-07-28 ENCOUNTER — Other Ambulatory Visit: Payer: Self-pay

## 2023-07-28 ENCOUNTER — Encounter (HOSPITAL_COMMUNITY): Payer: Self-pay | Admitting: *Deleted

## 2023-07-28 ENCOUNTER — Emergency Department (HOSPITAL_COMMUNITY)
Admission: EM | Admit: 2023-07-28 | Discharge: 2023-07-29 | Disposition: A | Discharge status: Home / Self Care | Attending: Emergency Medicine | Admitting: Emergency Medicine

## 2023-07-28 DIAGNOSIS — Z711 Person with feared health complaint in whom no diagnosis is made: Secondary | ICD-10-CM | POA: Insufficient documentation

## 2023-07-28 DIAGNOSIS — M79662 Pain in left lower leg: Secondary | ICD-10-CM | POA: Diagnosis not present

## 2023-07-28 DIAGNOSIS — R030 Elevated blood-pressure reading, without diagnosis of hypertension: Secondary | ICD-10-CM | POA: Diagnosis not present

## 2023-07-28 DIAGNOSIS — R001 Bradycardia, unspecified: Secondary | ICD-10-CM | POA: Insufficient documentation

## 2023-07-28 DIAGNOSIS — M79661 Pain in right lower leg: Secondary | ICD-10-CM | POA: Insufficient documentation

## 2023-07-28 DIAGNOSIS — I10 Essential (primary) hypertension: Secondary | ICD-10-CM

## 2023-07-28 DIAGNOSIS — R519 Headache, unspecified: Secondary | ICD-10-CM

## 2023-07-28 MED ORDER — AMLODIPINE BESYLATE 5 MG PO TABS
10.0000 mg | ORAL_TABLET | Freq: Once | ORAL | Status: AC
  Administered 2023-07-28: 10 mg via ORAL
  Filled 2023-07-28: qty 2

## 2023-07-28 MED ORDER — ACETAMINOPHEN 500 MG PO TABS
1000.0000 mg | ORAL_TABLET | Freq: Four times a day (QID) | ORAL | Status: DC | PRN
  Administered 2023-07-28: 1000 mg via ORAL
  Filled 2023-07-28: qty 2

## 2023-07-28 NOTE — ED Triage Notes (Signed)
 The pt  today is c/o of  body pain

## 2023-07-28 NOTE — ED Triage Notes (Signed)
 The  pt was just discharged fro this ed and the pt turned around and checked back in this is usual behavior for her  she wants a new collar
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
## 2023-07-29 ENCOUNTER — Emergency Department (HOSPITAL_COMMUNITY): Admission: EM | Admit: 2023-07-29 | Discharge: 2023-07-29 | Disposition: A | Discharge status: Home / Self Care

## 2023-07-29 ENCOUNTER — Encounter (HOSPITAL_COMMUNITY): Payer: Self-pay

## 2023-07-29 ENCOUNTER — Other Ambulatory Visit: Payer: Self-pay

## 2023-07-29 ENCOUNTER — Emergency Department (HOSPITAL_COMMUNITY)
Admission: EM | Admit: 2023-07-29 | Discharge: 2023-07-29 | Disposition: A | Discharge status: Home / Self Care | Source: Home / Self Care | Attending: Emergency Medicine | Admitting: Emergency Medicine

## 2023-07-29 ENCOUNTER — Emergency Department (HOSPITAL_COMMUNITY)
Admission: EM | Admit: 2023-07-29 | Discharge: 2023-07-30 | Disposition: A | Discharge status: Home / Self Care | Attending: Emergency Medicine | Admitting: Emergency Medicine

## 2023-07-29 DIAGNOSIS — R52 Pain, unspecified: Secondary | ICD-10-CM | POA: Diagnosis present

## 2023-07-29 DIAGNOSIS — G8929 Other chronic pain: Secondary | ICD-10-CM | POA: Insufficient documentation

## 2023-07-29 DIAGNOSIS — M542 Cervicalgia: Secondary | ICD-10-CM | POA: Insufficient documentation

## 2023-07-29 DIAGNOSIS — M791 Myalgia, unspecified site: Secondary | ICD-10-CM | POA: Diagnosis present

## 2023-07-29 DIAGNOSIS — Z760 Encounter for issue of repeat prescription: Secondary | ICD-10-CM | POA: Insufficient documentation

## 2023-07-29 DIAGNOSIS — M79606 Pain in leg, unspecified: Secondary | ICD-10-CM | POA: Insufficient documentation

## 2023-07-29 MED ORDER — ACETAMINOPHEN 325 MG PO TABS
650.0000 mg | ORAL_TABLET | Freq: Once | ORAL | Status: AC
  Administered 2023-07-29: 650 mg via ORAL
  Filled 2023-07-29: qty 2

## 2023-07-29 NOTE — ED Provider Notes (Signed)
  [facility]-EMERGENCY DEPT [facility] Emergency Department Provider Note MRN:  [id number]  Arrival date & time: 07/29/23     Chief Complaint   wants new collar   History of Present Illness   Flornce Record is a 78 y.o. year-old female presents to the ED with chief complaint of requesting new neck brace.  She returns again to the ER stating that she needs a new soft collar.  She denies any other new complaints.  She was seen just hours ago today.  History provided by patient.   Review of Systems  Pertinent positive and negative review of systems noted in HPI.    Physical Exam   Vitals:   07/28/23 2203  BP: (!) 169/59  Pulse: (!) 54  Resp: 16  Temp: 98.2 F (36.8 C)  SpO2: 100%    CONSTITUTIONAL:  non toxic-appearing, NAD NEURO:  Alert and oriented x 3, CN 3-12 grossly intact EYES:  eyes equal and reactive ENT/NECK:  Supple, no stridor  CARDIO:  slight bradycardia, regular rhythm, appears well-perfused  PULM:  No respiratory distress,  GI/GU:  non-distended,  MSK/SPINE:  No gross deformities, no edema, moves all extremities, soft c-collar in place  SKIN:  no rash, atraumatic   *Additional and/or pertinent findings included in MDM below  Diagnostic and Interventional Summary    EKG Interpretation Date/Time:    Ventricular Rate:    PR Interval:    QRS Duration:    QT Interval:    QTC Calculation:   R Axis:      Text Interpretation:         Labs Reviewed - No data to display  No orders to display    Medications - No data to display   Procedures  /  Critical Care Procedures  ED Course and Medical Decision Making  I have reviewed the triage vital signs, the nursing notes, and pertinent available records from the EMR.  Social Determinants Affecting Complexity of Care: Patient has no clinically significant social determinants affecting this chief complaint..   ED Course:    Medical Decision Making Patient here requesting a new soft collar.  She is  seen almost daily.  She appears in her normal state of health.  I do not think she needs any further workup.         Consultants: No consultations were needed in caring for this patient.   Treatment and Plan: Emergency department workup does not suggest an emergent condition requiring admission or immediate intervention beyond  what has been performed at this time. The patient is safe for discharge and has  been instructed to return immediately for worsening symptoms, change in  symptoms or any other concerns    Final Clinical Impressions(s) / ED Diagnoses     ICD-10-CM   1. Worried well  Z71.1       ED Discharge Orders     None         Discharge Instructions Discussed with and Provided to Patient:   Discharge Instructions   None      Vicky Charleston, PA-C 07/29/23 0420    Palumbo, April, MD 07/29/23 [phone]

## 2023-07-29 NOTE — ED Provider Notes (Signed)
  EMERGENCY DEPARTMENT AT [facility] Provider Note   CSN: [id number] Arrival date & time: 07/29/23  9053     Patient presents with: Headache   [name] is a 78 y.o. female.   78 year old female well-known to the emergency department was seen 18 times in the month of June.  Reports generalized body pain.  Numerous evaluations for the same.  Thinks that her blood pressure might be elevated.  Triage notes that patient presented due to headache.  When asked why patient is here she reported above symptoms and had no concern for headache.  However when I mention the triage note, she states that she does have a slight headache.   Headache      Prior to Admission medications   Medication Sig Start Date End Date Taking? Authorizing Provider  acetaminophen  (TYLENOL ) 500 MG tablet Take 1 tablet (500 mg total) by mouth every 6 (six) hours as needed. 05/31/23   Bauer, Collin S, PA-C  albuterol  (VENTOLIN  HFA) 108 (90 Base) MCG/ACT inhaler Inhale 1-2 puffs into the lungs every 6 (six) hours as needed for wheezing or shortness of breath. 04/22/20   Myrna Camelia HERO, NP  amLODipine  (NORVASC ) 10 MG tablet Take 1 tablet (10 mg total) by mouth daily. 01/23/23   Roemhildt, Lorin T, PA-C  chlorthalidone  (HYGROTON ) 25 MG tablet TAKE 1 TABLET (25 MG TOTAL) BY MOUTH DAILY. 11/20/21 11/20/22  Paseda, Folashade R, FNP  diclofenac  Sodium (VOLTAREN ) 1 % GEL Apply 4 g topically 4 (four) times daily. 05/28/23   Smoot, Lauraine LABOR, PA-C  diphenhydrAMINE  (BENADRYL ) 25 MG tablet Take 1 tablet (25 mg total) by mouth every 6 (six) hours as needed for itching or allergies. 10/03/22   [name], PA-C  gabapentin  (NEURONTIN ) 100 MG capsule Take 1 capsule (100 mg total) by mouth 3 (three) times daily as needed. 04/30/23   Bero, Michael M, MD  hydrocortisone  cream 1 % Apply to affected area 2 times daily 10/22/22   Jarold Olam HERO, PA-C  hydrOXYzine  (ATARAX ) 25 MG tablet Take 1 tablet (25 mg total) by mouth every 8  (eight) hours as needed. 11/23/22   Henderly, Britni A, PA-C  lidocaine  (LIDODERM ) 5 % Place 1 patch onto the skin daily. Remove & Discard patch within 12 hours or as directed by MD 07/13/23   Randol Simmonds, MD  loratadine  (CLARITIN ) 10 MG tablet Take 1 tablet (10 mg total) by mouth daily. 04/06/23   Barrett, Warren SAILOR, PA-C  metFORMIN  (GLUCOPHAGE ) 500 MG tablet Take 1 tablet (500 mg total) by mouth 2 (two) times daily with a meal. 11/03/22 11/03/23  Lang Norleen POUR, PA-C  methocarbamol  (ROBAXIN ) 500 MG tablet Take 1 tablet (500 mg total) by mouth 2 (two) times daily. 03/19/23   Ula Prentice SAUNDERS, MD  metoprolol  succinate (TOPROL -XL) 25 MG 24 hr tablet Take 1 tablet (25 mg total) by mouth daily. 06/18/23   Melvenia Motto, MD  naproxen  (NAPROSYN ) 375 MG tablet Take 1 tablet (375 mg total) by mouth 2 (two) times daily. 03/17/23   Henderly, Britni A, PA-C  pantoprazole  (PROTONIX ) 20 MG tablet Take 1 tablet (20 mg total) by mouth daily. 10/10/21   Haviland, Julie, MD  triamcinolone  cream (KENALOG ) 0.1 % Apply 1 Application topically 2 (two) times daily. 07/07/23   Vicky Charleston, PA-C    Allergies: Codeine, Ibuprofen , and Imdur [isosorbide nitrate]    Review of Systems  Neurological:  Positive for headaches.    Updated Vital Signs BP (!) 141/51 (BP  Location: Left Arm)   Pulse 60   Temp 98.4 F (36.9 C) (Oral)   Resp 16   SpO2 97%   Physical Exam Vitals and nursing note reviewed.  Constitutional:      General: She is not in acute distress.    Appearance: She is not ill-appearing or toxic-appearing.  HENT:     Head: Normocephalic.     Nose: Nose normal.     Mouth/Throat:     Mouth: Mucous membranes are moist.  Eyes:     Conjunctiva/sclera: Conjunctivae normal.  Cardiovascular:     Rate and Rhythm: Normal rate and regular rhythm.  Pulmonary:     Effort: Pulmonary effort is normal.  Abdominal:     General: Abdomen is flat. There is no distension.     Tenderness: There is no abdominal tenderness.  There is no guarding or rebound.  Musculoskeletal:     Right lower leg: No edema.     Left lower leg: No edema.  Neurological:     General: No focal deficit present.     Mental Status: She is alert and oriented to person, place, and time.     Cranial Nerves: No cranial nerve deficit.     Sensory: No sensory deficit.     Motor: No weakness.     Coordination: Coordination normal.  Psychiatric:        Mood and Affect: Mood normal.        Behavior: Behavior normal.     (all labs ordered are listed, but only abnormal results are displayed) Labs Reviewed - No data to display  EKG: None  Radiology: No results found.   Procedures   Medications Ordered in the ED - No data to display                                  Medical Decision Making This is a 78 year old female presenting emergency department with body pain, seemingly a chronic problem and has been evaluated numerous times in the emergency department.  In fact, she was seen earlier this morning asking for a new neck brace.  Today complaining of same symptoms as previous visits with whole body pain and elevated blood pressure.  She is 141/51.  Triage note with complaint of severe headache for arrival today, but patient only endorsed headache after I brought it up and noted that it was mild.  She has a nonfocal neuroexam.  Per chart review had CT head and MRI done in May.  I have low suspicion for acute intracranial pathology at this time.  I suspect her presentation today is more due to socioeconomic reasons rather than true medical emergency.  Will discharge in stable condition at this time.      Final diagnoses:  None    ED Discharge Orders     None          Neysa Caron PARAS, DO 07/29/23 1053

## 2023-07-29 NOTE — ED Provider Notes (Signed)
 [name] Provider Note   CSN: [id number] Arrival date & time: 07/29/23  1614     Patient presents with: Medication Refill   [name] is a 78 y.o. female.   HPI    78 year old female comes in with chief complaint of chronic pain.  Patient states that she has been having pain over her legs bilaterally for the last several days.  Patient has been in the ER with the same complaint multiple times over the last several months.  She also complains of neck pain, also chronic in nature but states that she would like to get a new soft collar.  Her current soft collar, which she has not been on is not tightening like it used to be.  Patient denies any numbness or tingling.  States that she wears the cervical collar only when she has neck pain.  Prior to Admission medications   Medication Sig Start Date End Date Taking? Authorizing Provider  acetaminophen  (TYLENOL ) 500 MG tablet Take 1 tablet (500 mg total) by mouth every 6 (six) hours as needed. 05/31/23   Bauer, Collin S, PA-C  albuterol  (VENTOLIN  HFA) 108 (90 Base) MCG/ACT inhaler Inhale 1-2 puffs into the lungs every 6 (six) hours as needed for wheezing or shortness of breath. 04/22/20   Myrna Camelia HERO, NP  amLODipine  (NORVASC ) 10 MG tablet Take 1 tablet (10 mg total) by mouth daily. 01/23/23   Roemhildt, Lorin T, PA-C  chlorthalidone  (HYGROTON ) 25 MG tablet TAKE 1 TABLET (25 MG TOTAL) BY MOUTH DAILY. 11/20/21 11/20/22  Paseda, Folashade R, FNP  diclofenac  Sodium (VOLTAREN ) 1 % GEL Apply 4 g topically 4 (four) times daily. 05/28/23   Smoot, Lauraine LABOR, PA-C  diphenhydrAMINE  (BENADRYL ) 25 MG tablet Take 1 tablet (25 mg total) by mouth every 6 (six) hours as needed for itching or allergies. 10/03/22   [name], PA-C  gabapentin  (NEURONTIN ) 100 MG capsule Take 1 capsule (100 mg total) by mouth 3 (three) times daily as needed. 04/30/23   Bero, Michael M, MD  hydrocortisone  cream 1 % Apply to affected area 2  times daily 10/22/22   Jarold Olam HERO, PA-C  hydrOXYzine  (ATARAX ) 25 MG tablet Take 1 tablet (25 mg total) by mouth every 8 (eight) hours as needed. 11/23/22   Henderly, Britni A, PA-C  lidocaine  (LIDODERM ) 5 % Place 1 patch onto the skin daily. Remove & Discard patch within 12 hours or as directed by MD 07/13/23   Randol Simmonds, MD  loratadine  (CLARITIN ) 10 MG tablet Take 1 tablet (10 mg total) by mouth daily. 04/06/23   Barrett, Warren SAILOR, PA-C  metFORMIN  (GLUCOPHAGE ) 500 MG tablet Take 1 tablet (500 mg total) by mouth 2 (two) times daily with a meal. 11/03/22 11/03/23  Lang Norleen POUR, PA-C  methocarbamol  (ROBAXIN ) 500 MG tablet Take 1 tablet (500 mg total) by mouth 2 (two) times daily. 03/19/23   Ula Prentice SAUNDERS, MD  metoprolol  succinate (TOPROL -XL) 25 MG 24 hr tablet Take 1 tablet (25 mg total) by mouth daily. 06/18/23   Melvenia Motto, MD  naproxen  (NAPROSYN ) 375 MG tablet Take 1 tablet (375 mg total) by mouth 2 (two) times daily. 03/17/23   Henderly, Britni A, PA-C  pantoprazole  (PROTONIX ) 20 MG tablet Take 1 tablet (20 mg total) by mouth daily. 10/10/21   Dean Clarity, MD  triamcinolone  cream (KENALOG ) 0.1 % Apply 1 Application topically 2 (two) times daily. 07/07/23   Vicky Charleston, PA-C    Allergies:  Codeine, Ibuprofen , and Imdur [isosorbide nitrate]    Review of Systems  All other systems reviewed and are negative.   Updated Vital Signs BP (!) 144/54 (BP Location: Left Arm)   Pulse (!) 57   Temp 97.9 F (36.6 C)   Resp 14   Ht 5' 4 (1.626 m)   Wt 49.9 kg   SpO2 100%   BMI 18.88 kg/m   Physical Exam Vitals and nursing note reviewed.  Constitutional:      Appearance: She is well-developed.  HENT:     Head: Atraumatic.  Cardiovascular:     Rate and Rhythm: Normal rate.  Pulmonary:     Effort: Pulmonary effort is normal.  Musculoskeletal:     Cervical back: Normal range of motion and neck supple.  Skin:    General: Skin is warm and dry.  Neurological:     Mental Status: She  is alert and oriented to person, place, and time.     (all labs ordered are listed, but only abnormal results are displayed) Labs Reviewed - No data to display  EKG: None  Radiology: No results found.   Procedures   Medications Ordered in the ED  acetaminophen  (TYLENOL ) tablet 650 mg (650 mg Oral Given 07/29/23 1807)                                    Medical Decision Making Risk OTC drugs.   78 year old patient comes in with chief complaint of neck pain, leg pain.  Patient has chronic pain syndrome and seen in the ER multiple times for the same complaints. This is the third time she has checked in today.  She was seen in the ER yesterday as well.  Patient does not need any acute workup or imaging.  We will try to get her a new of soft collar.  She is stable for discharge.  Final diagnoses:  Medication refill  Other chronic pain    ED Discharge Orders     None          Adriana Sora, MD 07/29/23 1818

## 2023-07-29 NOTE — ED Triage Notes (Signed)
 Pt to er, pt states that she is hurting all over, states that she was seen earlier today and has since gone home, but came back because she was still having pain.

## 2023-07-29 NOTE — ED Triage Notes (Signed)
 Pt arrives with c/o body pain that she would like meds for. Pt has been seen multiple times in the last 24 hours.

## 2023-07-29 NOTE — ED Triage Notes (Signed)
 Pt came in via POV d/t a HA, states it is from her BP being high. A/Ox4, rates her generalized body pain & HA is 10/10 during triage.

## 2023-07-30 ENCOUNTER — Other Ambulatory Visit: Payer: Self-pay

## 2023-07-30 ENCOUNTER — Emergency Department (HOSPITAL_COMMUNITY)
Admission: EM | Admit: 2023-07-30 | Discharge: 2023-07-31 | Disposition: A | Discharge status: Home / Self Care | Attending: Emergency Medicine | Admitting: Emergency Medicine

## 2023-07-30 DIAGNOSIS — Z8673 Personal history of transient ischemic attack (TIA), and cerebral infarction without residual deficits: Secondary | ICD-10-CM | POA: Diagnosis not present

## 2023-07-30 DIAGNOSIS — I10 Essential (primary) hypertension: Secondary | ICD-10-CM | POA: Insufficient documentation

## 2023-07-30 DIAGNOSIS — E119 Type 2 diabetes mellitus without complications: Secondary | ICD-10-CM | POA: Diagnosis not present

## 2023-07-30 DIAGNOSIS — Z711 Person with feared health complaint in whom no diagnosis is made: Secondary | ICD-10-CM | POA: Diagnosis not present

## 2023-07-30 DIAGNOSIS — M542 Cervicalgia: Secondary | ICD-10-CM | POA: Diagnosis present

## 2023-07-30 MED ORDER — ACETAMINOPHEN 325 MG PO TABS
650.0000 mg | ORAL_TABLET | Freq: Once | ORAL | Status: AC
  Administered 2023-07-30: 650 mg via ORAL
  Filled 2023-07-30: qty 2
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
## 2023-07-30 NOTE — Discharge Instructions (Addendum)
Return to the emergency department if you develop any life-threatening symptoms.
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
## 2023-07-31 MED ORDER — ACETAMINOPHEN 500 MG PO TABS
1000.0000 mg | ORAL_TABLET | Freq: Once | ORAL | Status: AC
  Administered 2023-07-31: 1000 mg via ORAL
  Filled 2023-07-31: qty 2
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
## 2023-07-31 NOTE — Discharge Instructions (Signed)
 Please try Tylenol  at home for your neck pain prior to coming to the Emergency Department.

## 2023-08-01 ENCOUNTER — Encounter (HOSPITAL_COMMUNITY): Payer: Self-pay | Admitting: Emergency Medicine

## 2023-08-01 ENCOUNTER — Emergency Department (HOSPITAL_COMMUNITY)
Admission: EM | Admit: 2023-08-01 | Discharge: 2023-08-01 | Disposition: A | Discharge status: Home / Self Care | Source: Home / Self Care | Attending: Emergency Medicine | Admitting: Emergency Medicine

## 2023-08-01 ENCOUNTER — Emergency Department (HOSPITAL_COMMUNITY)
Admission: EM | Admit: 2023-08-01 | Discharge: 2023-08-01 | Disposition: A | Discharge status: Home / Self Care | Attending: Emergency Medicine | Admitting: Emergency Medicine

## 2023-08-01 ENCOUNTER — Other Ambulatory Visit: Payer: Self-pay

## 2023-08-01 DIAGNOSIS — G8929 Other chronic pain: Secondary | ICD-10-CM | POA: Diagnosis present

## 2023-08-01 DIAGNOSIS — I10 Essential (primary) hypertension: Secondary | ICD-10-CM | POA: Diagnosis not present

## 2023-08-01 DIAGNOSIS — M542 Cervicalgia: Secondary | ICD-10-CM | POA: Diagnosis present

## 2023-08-01 DIAGNOSIS — E119 Type 2 diabetes mellitus without complications: Secondary | ICD-10-CM | POA: Insufficient documentation

## 2023-08-01 DIAGNOSIS — Z79899 Other long term (current) drug therapy: Secondary | ICD-10-CM | POA: Diagnosis not present

## 2023-08-01 DIAGNOSIS — Z7984 Long term (current) use of oral hypoglycemic drugs: Secondary | ICD-10-CM | POA: Diagnosis not present

## 2023-08-01 DIAGNOSIS — Z7689 Persons encountering health services in other specified circumstances: Secondary | ICD-10-CM | POA: Diagnosis not present

## 2023-08-01 MED ORDER — ACETAMINOPHEN 500 MG PO TABS
1000.0000 mg | ORAL_TABLET | Freq: Once | ORAL | Status: AC
  Administered 2023-08-01: 1000 mg via ORAL
  Filled 2023-08-01: qty 2

## 2023-08-01 MED ORDER — METHOCARBAMOL 500 MG PO TABS: 500.0000 mg | ORAL_TABLET | Freq: Once | ORAL | Status: DC
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
## 2023-08-01 NOTE — Discharge Instructions (Addendum)
 You can take tylenol  1,000 mg every 8 hours. Please follow up with a primary care physician.

## 2023-08-01 NOTE — ED Triage Notes (Signed)
 Pt reports body aches all over.

## 2023-08-02 ENCOUNTER — Other Ambulatory Visit: Payer: Self-pay

## 2023-08-02 ENCOUNTER — Emergency Department (HOSPITAL_COMMUNITY)
Admission: EM | Admit: 2023-08-02 | Discharge: 2023-08-03 | Disposition: A | Discharge status: Home / Self Care | Attending: Emergency Medicine | Admitting: Emergency Medicine

## 2023-08-02 ENCOUNTER — Encounter (HOSPITAL_COMMUNITY): Payer: Self-pay

## 2023-08-02 ENCOUNTER — Emergency Department (HOSPITAL_COMMUNITY)
Admission: EM | Admit: 2023-08-02 | Discharge: 2023-08-02 | Disposition: A | Discharge status: Home / Self Care | Attending: Emergency Medicine | Admitting: Emergency Medicine

## 2023-08-02 ENCOUNTER — Encounter (HOSPITAL_COMMUNITY): Payer: Self-pay | Admitting: *Deleted

## 2023-08-02 DIAGNOSIS — Z79899 Other long term (current) drug therapy: Secondary | ICD-10-CM | POA: Insufficient documentation

## 2023-08-02 DIAGNOSIS — Z7689 Persons encountering health services in other specified circumstances: Secondary | ICD-10-CM | POA: Diagnosis not present

## 2023-08-02 DIAGNOSIS — R52 Pain, unspecified: Secondary | ICD-10-CM | POA: Diagnosis present

## 2023-08-02 DIAGNOSIS — G8929 Other chronic pain: Secondary | ICD-10-CM | POA: Insufficient documentation

## 2023-08-02 DIAGNOSIS — I10 Essential (primary) hypertension: Secondary | ICD-10-CM | POA: Insufficient documentation

## 2023-08-02 DIAGNOSIS — Z7984 Long term (current) use of oral hypoglycemic drugs: Secondary | ICD-10-CM | POA: Diagnosis not present

## 2023-08-02 DIAGNOSIS — E119 Type 2 diabetes mellitus without complications: Secondary | ICD-10-CM | POA: Insufficient documentation

## 2023-08-02 DIAGNOSIS — M542 Cervicalgia: Secondary | ICD-10-CM | POA: Diagnosis present

## 2023-08-02 MED ORDER — AMLODIPINE BESYLATE 5 MG PO TABS
10.0000 mg | ORAL_TABLET | Freq: Once | ORAL | Status: AC
  Administered 2023-08-02: 10 mg via ORAL
  Filled 2023-08-02: qty 2

## 2023-08-02 MED ORDER — LIDOCAINE 5 % EX PTCH: 1.0000 | MEDICATED_PATCH | CUTANEOUS | 0 refills | Status: DC

## 2023-08-02 MED ORDER — ACETAMINOPHEN 500 MG PO TABS: 500.0000 mg | ORAL_TABLET | Freq: Four times a day (QID) | ORAL | 0 refills | Status: DC | PRN

## 2023-08-02 MED ORDER — ACETAMINOPHEN 500 MG PO TABS
1000.0000 mg | ORAL_TABLET | Freq: Once | ORAL | Status: AC
  Administered 2023-08-02: 1000 mg via ORAL
  Filled 2023-08-02: qty 2
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
## 2023-08-02 NOTE — ED Triage Notes (Signed)
 Pt states she is having body pains and Tylenol  isn't working. Pt is seeking pain control at this time.

## 2023-08-02 NOTE — Discharge Instructions (Signed)
 RESOURCE GUIDE  Chronic Pain Problems: Contact [facility]  [phone] Patients need to be referred by their primary care doctor.  Insufficient Money for Medicine: Contact [employer]:  call [phone]  No Primary Care Doctor: Call [facility]  [phone] - can help you locate a primary care doctor that  accepts your insurance, provides certain services, etc. Physician Referral Service- [phone]  Agencies that provide inexpensive medical care: [name]  [phone] [facility]  [phone] [facility]  [phone] [facility]  [phone] [facility]  [phone] [facility]  [phone]  Medicaid-accepting [facility] Providers: [facility]- [address]  [phone], Mon-Fri 9am-7pm, Sat 9am-1pm [facility]- [address] [place], [address] [place]  [phone] [facility]- [address], [address] [place]  [phone] [facility]- [address]  [phone] Renaye Rakers- [address] [place], [address], [phone]  Only accepts [place] patients after they have their name  applied to their card  Self Pay (no insurance) in [facility]: Sickle Cell Patients - [facility]  [address] [place], [phone] [facility]gent Care- [address] [place]  [phone]       [name] [place]- [address]       -     [facility]- see information above (Speak to [employer] if you do not have insurance)       -  [facility]- [address],  [phone]       -  [facility]- [address] [phone]       -  Dr Julio Sicks-  [address], [place], [phone]       -  [facility] - [address] [phone]       -  [facility]- [address] [phone], also [address],  [phone]       -     [facility]- [address] [place], [phone], 1st & 3rd Saturday         every month, 10am-1pm  -     [facility]   [address] [place], [place].   Phone:  [phone], Fax:  [phone]. Hours of Operation:  9 am - 6 pm, M-F.  -     [facility]   [address], [place]   Phone: [phone], Fax: [phone]. Hours of Operation:  8:30 am - 5:30 pm, M-F.    Dental Assistance If unable to pay or uninsured, contact:  [facility]. to become qualified for the adult dental clinic.  Patients with Medicaid: [facility] [phone] [address], [phone] [address], [phone]  If unable to pay, or uninsured, contact [facility] [phone] in [place], [phone] in [facility]) to become qualified for the adult dental clinic  [facility] [address] [place], [place] [address] [phone] [email or web address]  Other Proofreader Services: [facility]- [address] [place], [place], [place], [address], [phone], Ext. [phone], 2nd and 4th Thursday of the month at 6:30am.  10 clients each day by appointment, can sometimes see walk-in patients if someone does not show for an appointment.  [facility]- [address] [place], [place], [address], [phone] [facility] [address] [place], [place], [address], [phone] [facility] Health Department- [phone] [facility] Health Department- [phone] [facility] [phone]

## 2023-08-02 NOTE — ED Triage Notes (Signed)
 Today the pt is c/o body painshe comes almost every day
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
## 2023-08-02 NOTE — ED Notes (Signed)
 Pt states she takes her BP medicine each morning. Pt denies headache and blurred vision at this time.

## 2023-08-03 ENCOUNTER — Emergency Department (HOSPITAL_COMMUNITY)
Admission: EM | Admit: 2023-08-03 | Discharge: 2023-08-03 | Disposition: A | Discharge status: Home / Self Care | Attending: Emergency Medicine | Admitting: Emergency Medicine

## 2023-08-03 ENCOUNTER — Emergency Department (HOSPITAL_COMMUNITY)
Admission: EM | Admit: 2023-08-03 | Discharge: 2023-08-04 | Disposition: A | Discharge status: Home / Self Care | Source: Home / Self Care | Attending: Emergency Medicine | Admitting: Emergency Medicine

## 2023-08-03 ENCOUNTER — Encounter (HOSPITAL_COMMUNITY): Payer: Self-pay | Admitting: *Deleted

## 2023-08-03 ENCOUNTER — Other Ambulatory Visit: Payer: Self-pay

## 2023-08-03 DIAGNOSIS — G894 Chronic pain syndrome: Secondary | ICD-10-CM | POA: Insufficient documentation

## 2023-08-03 DIAGNOSIS — M542 Cervicalgia: Secondary | ICD-10-CM | POA: Insufficient documentation

## 2023-08-03 DIAGNOSIS — G8929 Other chronic pain: Secondary | ICD-10-CM | POA: Insufficient documentation

## 2023-08-03 MED ORDER — ACETAMINOPHEN 325 MG PO TABS
650.0000 mg | ORAL_TABLET | Freq: Once | ORAL | Status: AC
  Administered 2023-08-03: 650 mg via ORAL
  Filled 2023-08-03: qty 2

## 2023-08-03 MED ORDER — AMLODIPINE BESYLATE 5 MG PO TABS
10.0000 mg | ORAL_TABLET | Freq: Once | ORAL | Status: AC
  Administered 2023-08-03: 10 mg via ORAL
  Filled 2023-08-03: qty 2

## 2023-08-03 NOTE — ED Notes (Signed)
 Pt ambulated to restroom without incident.

## 2023-08-03 NOTE — ED Triage Notes (Signed)
 The pt was just here earlier  maybe she did not leave she reports that she wants a rx

## 2023-08-03 NOTE — ED Triage Notes (Signed)
 This is the 4th visit for this patient in her last 12 hours , with the same complaint neck and legs hurt.

## 2023-08-03 NOTE — ED Triage Notes (Signed)
 The pt was just discharged and she checked back in.  Body pain
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
## 2023-08-03 NOTE — ED Notes (Addendum)
 Pt BP 189/73. RN checked twice bilaterally. EDP aware and placed home BP medication
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
## 2023-08-03 NOTE — ED Provider Notes (Cosign Needed Addendum)
 [place] EMERGENCY DEPARTMENT AT [facility] Provider Note   CSN: [id number] Arrival date & time: 08/03/23  1216     Patient presents with: Neck Pain   [name] is a 78 y.o. female.   Patient checked into the emergency department.  Patient states that she did not leave as she just went and sat in the lobby until she got hungry.  Patient states that she is hungry for chicken nuggets and hashbrowns.  Patient states that she does not want to leave.  Patient states she is not ready to go home.  Patient tells me she does not like being home alone.  She tells me her daughter is working today.     Neck Pain      Prior to Admission medications   Medication Sig Start Date End Date Taking? Authorizing Provider  acetaminophen  (TYLENOL ) 500 MG tablet Take 1 tablet (500 mg total) by mouth every 6 (six) hours as needed. 08/02/23   Elnor Jayson LABOR, DO  albuterol  (VENTOLIN  HFA) 108 (90 Base) MCG/ACT inhaler Inhale 1-2 puffs into the lungs every 6 (six) hours as needed for wheezing or shortness of breath. 04/22/20   Myrna Camelia HERO, NP  amLODipine  (NORVASC ) 10 MG tablet Take 1 tablet (10 mg total) by mouth daily. 01/23/23   Roemhildt, Lorin T, PA-C  chlorthalidone  (HYGROTON ) 25 MG tablet TAKE 1 TABLET (25 MG TOTAL) BY MOUTH DAILY. 11/20/21 11/20/22  Paseda, Folashade R, FNP  diclofenac  Sodium (VOLTAREN ) 1 % GEL Apply 4 g topically 4 (four) times daily. 05/28/23   Smoot, Lauraine LABOR, PA-C  diphenhydrAMINE  (BENADRYL ) 25 MG tablet Take 1 tablet (25 mg total) by mouth every 6 (six) hours as needed for itching or allergies. 10/03/22   [name], PA-C  gabapentin  (NEURONTIN ) 100 MG capsule Take 1 capsule (100 mg total) by mouth 3 (three) times daily as needed. 04/30/23   Bero, Michael M, MD  hydrocortisone  cream 1 % Apply to affected area 2 times daily 10/22/22   Jarold Olam HERO, PA-C  hydrOXYzine  (ATARAX ) 25 MG tablet Take 1 tablet (25 mg total) by mouth every 8 (eight) hours as needed. 11/23/22    Henderly, Britni A, PA-C  lidocaine  (LIDODERM ) 5 % Place 1 patch onto the skin daily. Remove & Discard patch within 12 hours or as directed by MD 08/02/23   Elnor Jayson LABOR, DO  loratadine  (CLARITIN ) 10 MG tablet Take 1 tablet (10 mg total) by mouth daily. 04/06/23   Barrett, Warren SAILOR, PA-C  metFORMIN  (GLUCOPHAGE ) 500 MG tablet Take 1 tablet (500 mg total) by mouth 2 (two) times daily with a meal. 11/03/22 11/03/23  Lang Norleen POUR, PA-C  methocarbamol  (ROBAXIN ) 500 MG tablet Take 1 tablet (500 mg total) by mouth 2 (two) times daily. 03/19/23   Ula Prentice SAUNDERS, MD  metoprolol  succinate (TOPROL -XL) 25 MG 24 hr tablet Take 1 tablet (25 mg total) by mouth daily. 06/18/23   Melvenia Motto, MD  naproxen  (NAPROSYN ) 375 MG tablet Take 1 tablet (375 mg total) by mouth 2 (two) times daily. 03/17/23   Henderly, Britni A, PA-C  pantoprazole  (PROTONIX ) 20 MG tablet Take 1 tablet (20 mg total) by mouth daily. 10/10/21   Dean Clarity, MD  triamcinolone  cream (KENALOG ) 0.1 % Apply 1 Application topically 2 (two) times daily. 07/07/23   Vicky Charleston, PA-C    Allergies: Codeine, Ibuprofen , and Imdur [isosorbide nitrate]    Review of Systems  Musculoskeletal:  Positive for neck pain.  All other systems  reviewed and are negative.   Updated Vital Signs BP (!) 134/50   Pulse 60   Temp 98.2 F (36.8 C) (Oral)   Resp 18   Ht 5' 4 (1.626 m)   Wt 50 kg   SpO2 100%   BMI 18.92 kg/m   Physical Exam Vitals and nursing note reviewed.  Constitutional:      Appearance: She is well-developed.  HENT:     Head: Normocephalic.  Cardiovascular:     Rate and Rhythm: Normal rate.  Pulmonary:     Effort: Pulmonary effort is normal.  Abdominal:     General: There is no distension.  Musculoskeletal:        General: Normal range of motion.     Cervical back: Normal range of motion.  Skin:    General: Skin is warm.  Neurological:     General: No focal deficit present.     Mental Status: She is alert and oriented to  person, place, and time.     (all labs ordered are listed, but only abnormal results are displayed) Labs Reviewed - No data to display  EKG: None  Radiology: No results found.   Procedures   Medications Ordered in the ED - No data to display                                  Medical Decision Making Pt complains of neck pain.  No change since evaluation this am   Risk Risk Details: This is the patient's third visit today.  Patient has not Left the Hospital. Food ordered for pt.          Final diagnoses:  Other chronic pain    ED Discharge Orders     None          Flint Sonny MARLA DEVONNA 08/03/23 1507    Flint Sonny MARLA DEVONNA 08/03/23 1551    Doretha Folks, MD 08/04/23 [phone]

## 2023-08-04 ENCOUNTER — Encounter (HOSPITAL_COMMUNITY): Payer: Self-pay | Admitting: *Deleted

## 2023-08-04 ENCOUNTER — Other Ambulatory Visit: Payer: Self-pay

## 2023-08-04 ENCOUNTER — Emergency Department (HOSPITAL_COMMUNITY)
Admission: EM | Admit: 2023-08-04 | Discharge: 2023-08-04 | Disposition: A | Discharge status: Home / Self Care | Attending: Emergency Medicine | Admitting: Emergency Medicine

## 2023-08-04 DIAGNOSIS — G8929 Other chronic pain: Secondary | ICD-10-CM | POA: Insufficient documentation

## 2023-08-04 DIAGNOSIS — Z5329 Procedure and treatment not carried out because of patient's decision for other reasons: Secondary | ICD-10-CM | POA: Insufficient documentation

## 2023-08-04 MED ORDER — DICLOFENAC SODIUM 1 % EX GEL: 4.0000 g | Freq: Four times a day (QID) | CUTANEOUS | 0 refills | Status: DC

## 2023-08-04 NOTE — ED Triage Notes (Signed)
 The pt today is c/o body pain
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
## 2023-08-04 NOTE — ED Notes (Signed)
 Pt left. No explanation

## 2023-08-04 NOTE — Discharge Instructions (Addendum)
 Establish a primary care provider.  Voltaren  gel sent into the pharmacy for you.

## 2023-08-05 ENCOUNTER — Other Ambulatory Visit: Payer: Self-pay

## 2023-08-05 ENCOUNTER — Encounter (HOSPITAL_COMMUNITY): Payer: Self-pay

## 2023-08-05 ENCOUNTER — Emergency Department (HOSPITAL_COMMUNITY)
Admission: EM | Admit: 2023-08-05 | Discharge: 2023-08-05 | Disposition: A | Discharge status: Home / Self Care | Attending: Student in an Organized Health Care Education/Training Program | Admitting: Student in an Organized Health Care Education/Training Program

## 2023-08-05 DIAGNOSIS — W19XXXA Unspecified fall, initial encounter: Secondary | ICD-10-CM | POA: Insufficient documentation

## 2023-08-05 DIAGNOSIS — M791 Myalgia, unspecified site: Secondary | ICD-10-CM | POA: Insufficient documentation

## 2023-08-05 DIAGNOSIS — R52 Pain, unspecified: Secondary | ICD-10-CM

## 2023-08-05 DIAGNOSIS — R519 Headache, unspecified: Secondary | ICD-10-CM | POA: Diagnosis not present

## 2023-08-05 DIAGNOSIS — Y92811 Bus as the place of occurrence of the external cause: Secondary | ICD-10-CM | POA: Insufficient documentation

## 2023-08-05 MED ORDER — ACETAMINOPHEN 500 MG PO TABS
500.0000 mg | ORAL_TABLET | Freq: Once | ORAL | Status: AC
  Administered 2023-08-05: 500 mg via ORAL
  Filled 2023-08-05: qty 1
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
## 2023-08-05 NOTE — ED Provider Notes (Signed)
 [place] EMERGENCY DEPARTMENT AT [facility] Provider Note   CSN: [id number] Arrival date & time: 08/05/23  1554     Patient presents with: Body aches   [name] is a 78 y.o. female.  {Add pertinent medical, surgical, social history, OB history to HPI:32947} HPI     Prior to Admission medications   Medication Sig Start Date End Date Taking? Authorizing Provider  acetaminophen  (TYLENOL ) 500 MG tablet Take 1 tablet (500 mg total) by mouth every 6 (six) hours as needed. 08/02/23   Elnor Jayson LABOR, DO  albuterol  (VENTOLIN  HFA) 108 (90 Base) MCG/ACT inhaler Inhale 1-2 puffs into the lungs every 6 (six) hours as needed for wheezing or shortness of breath. 04/22/20   Myrna Camelia HERO, NP  amLODipine  (NORVASC ) 10 MG tablet Take 1 tablet (10 mg total) by mouth daily. 01/23/23   Roemhildt, Lorin T, PA-C  chlorthalidone  (HYGROTON ) 25 MG tablet TAKE 1 TABLET (25 MG TOTAL) BY MOUTH DAILY. 11/20/21 11/20/22  Paseda, Folashade R, FNP  diclofenac  Sodium (VOLTAREN ) 1 % GEL Apply 4 g topically 4 (four) times daily. 08/04/23   Hildegard Loge, PA-C  diphenhydrAMINE  (BENADRYL ) 25 MG tablet Take 1 tablet (25 mg total) by mouth every 6 (six) hours as needed for itching or allergies. 10/03/22   [name], PA-C  gabapentin  (NEURONTIN ) 100 MG capsule Take 1 capsule (100 mg total) by mouth 3 (three) times daily as needed. 04/30/23   Bero, Michael M, MD  hydrocortisone  cream 1 % Apply to affected area 2 times daily 10/22/22   Jarold Olam HERO, PA-C  hydrOXYzine  (ATARAX ) 25 MG tablet Take 1 tablet (25 mg total) by mouth every 8 (eight) hours as needed. 11/23/22   Henderly, Britni A, PA-C  lidocaine  (LIDODERM ) 5 % Place 1 patch onto the skin daily. Remove & Discard patch within 12 hours or as directed by MD 08/02/23   Elnor Jayson A, DO  loratadine  (CLARITIN ) 10 MG tablet Take 1 tablet (10 mg total) by mouth daily. 04/06/23   Barrett, Warren SAILOR, PA-C  metFORMIN  (GLUCOPHAGE ) 500 MG tablet Take 1 tablet (500 mg total) by  mouth 2 (two) times daily with a meal. 11/03/22 11/03/23  Lang Norleen POUR, PA-C  methocarbamol  (ROBAXIN ) 500 MG tablet Take 1 tablet (500 mg total) by mouth 2 (two) times daily. 03/19/23   Ula Prentice SAUNDERS, MD  metoprolol  succinate (TOPROL -XL) 25 MG 24 hr tablet Take 1 tablet (25 mg total) by mouth daily. 06/18/23   Melvenia Motto, MD  naproxen  (NAPROSYN ) 375 MG tablet Take 1 tablet (375 mg total) by mouth 2 (two) times daily. 03/17/23   Henderly, Britni A, PA-C  pantoprazole  (PROTONIX ) 20 MG tablet Take 1 tablet (20 mg total) by mouth daily. 10/10/21   Dean Clarity, MD  triamcinolone  cream (KENALOG ) 0.1 % Apply 1 Application topically 2 (two) times daily. 07/07/23   Vicky Charleston, PA-C    Allergies: Codeine, Ibuprofen , and Imdur [isosorbide nitrate]    Review of Systems  Updated Vital Signs BP (!) 198/60   Pulse (!) 55   Temp 98.1 F (36.7 C) (Oral)   Resp 15   SpO2 100%   Physical Exam  (all labs ordered are listed, but only abnormal results are displayed) Labs Reviewed - No data to display  EKG: None  Radiology: No results found.  {Document cardiac monitor, telemetry assessment procedure when appropriate:32947} Procedures   Medications Ordered in the ED - No data to display    {Click here for ABCD2,  HEART and other calculators REFRESH Note before signing:1}                              Medical Decision Making Risk OTC drugs.   ***  {Document critical care time when appropriate  Document review of labs and clinical decision tools ie CHADS2VASC2, etc  Document your independent review of radiology images and any outside records  Document your discussion with family members, caretakers and with consultants  Document social determinants of health affecting pt's care  Document your decision making why or why not admission, treatments were needed:32947:::1}   Final diagnoses:  None    ED Discharge Orders     None

## 2023-08-05 NOTE — ED Notes (Signed)
 The pt  left apparently before she received her discharge papers  no answer not sure anyone saw her leave

## 2023-08-06 ENCOUNTER — Encounter (HOSPITAL_COMMUNITY): Payer: Self-pay

## 2023-08-06 ENCOUNTER — Emergency Department (HOSPITAL_COMMUNITY)
Admission: EM | Admit: 2023-08-06 | Discharge: 2023-08-07 | Disposition: A | Discharge status: Home / Self Care | Attending: Emergency Medicine | Admitting: Emergency Medicine

## 2023-08-06 DIAGNOSIS — M7918 Myalgia, other site: Secondary | ICD-10-CM | POA: Insufficient documentation

## 2023-08-06 DIAGNOSIS — G8929 Other chronic pain: Secondary | ICD-10-CM | POA: Insufficient documentation

## 2023-08-06 NOTE — ED Triage Notes (Signed)
 PT arrives via POV. Reports worsening chronic pain all over. PT AxOx4.

## 2023-08-07 MED ORDER — ACETAMINOPHEN 500 MG PO TABS
1000.0000 mg | ORAL_TABLET | Freq: Once | ORAL | Status: AC
  Administered 2023-08-07: 1000 mg via ORAL
  Filled 2023-08-07: qty 2

## 2023-08-07 NOTE — ED Provider Notes (Signed)
 [place] EMERGENCY DEPARTMENT AT [facility] Provider Note   CSN: [id number] Arrival date & time: 08/06/23  1420     Patient presents with: Generalized Body Aches   [name] is a 78 y.o. female.   The history is provided by the patient.  Muscle Pain This is a chronic problem. The current episode started more than 1 week ago. The problem occurs constantly. The problem has not changed since onset.Pertinent negatives include no chest pain, no abdominal pain, no headaches and no shortness of breath. Nothing aggravates the symptoms. Nothing relieves the symptoms. She has tried nothing for the symptoms. The treatment provided no relief.       Prior to Admission medications   Medication Sig Start Date End Date Taking? Authorizing Provider  acetaminophen  (TYLENOL ) 500 MG tablet Take 1 tablet (500 mg total) by mouth every 6 (six) hours as needed. 08/02/23   Elnor Jayson LABOR, DO  albuterol  (VENTOLIN  HFA) 108 (90 Base) MCG/ACT inhaler Inhale 1-2 puffs into the lungs every 6 (six) hours as needed for wheezing or shortness of breath. 04/22/20   Myrna Camelia HERO, NP  amLODipine  (NORVASC ) 10 MG tablet Take 1 tablet (10 mg total) by mouth daily. 01/23/23   Roemhildt, Lorin T, PA-C  chlorthalidone  (HYGROTON ) 25 MG tablet TAKE 1 TABLET (25 MG TOTAL) BY MOUTH DAILY. 11/20/21 11/20/22  Paseda, Folashade R, FNP  diclofenac  Sodium (VOLTAREN ) 1 % GEL Apply 4 g topically 4 (four) times daily. 08/04/23   Hildegard Loge, PA-C  diphenhydrAMINE  (BENADRYL ) 25 MG tablet Take 1 tablet (25 mg total) by mouth every 6 (six) hours as needed for itching or allergies. 10/03/22   [name], PA-C  gabapentin  (NEURONTIN ) 100 MG capsule Take 1 capsule (100 mg total) by mouth 3 (three) times daily as needed. 04/30/23   Bero, Michael M, MD  hydrocortisone  cream 1 % Apply to affected area 2 times daily 10/22/22   Jarold Olam HERO, PA-C  hydrOXYzine  (ATARAX ) 25 MG tablet Take 1 tablet (25 mg total) by mouth every 8 (eight) hours  as needed. 11/23/22   Henderly, Britni A, PA-C  lidocaine  (LIDODERM ) 5 % Place 1 patch onto the skin daily. Remove & Discard patch within 12 hours or as directed by MD 08/02/23   Elnor Jayson LABOR, DO  loratadine  (CLARITIN ) 10 MG tablet Take 1 tablet (10 mg total) by mouth daily. 04/06/23   Barrett, Jamie N, PA-C  metFORMIN  (GLUCOPHAGE ) 500 MG tablet Take 1 tablet (500 mg total) by mouth 2 (two) times daily with a meal. 11/03/22 11/03/23  Lang Norleen POUR, PA-C  methocarbamol  (ROBAXIN ) 500 MG tablet Take 1 tablet (500 mg total) by mouth 2 (two) times daily. 03/19/23   Ula Prentice SAUNDERS, MD  metoprolol  succinate (TOPROL -XL) 25 MG 24 hr tablet Take 1 tablet (25 mg total) by mouth daily. 06/18/23   Melvenia Motto, MD  naproxen  (NAPROSYN ) 375 MG tablet Take 1 tablet (375 mg total) by mouth 2 (two) times daily. 03/17/23   Henderly, Britni A, PA-C  pantoprazole  (PROTONIX ) 20 MG tablet Take 1 tablet (20 mg total) by mouth daily. 10/10/21   Dean Clarity, MD  triamcinolone  cream (KENALOG ) 0.1 % Apply 1 Application topically 2 (two) times daily. 07/07/23   Vicky Charleston, PA-C    Allergies: Codeine, Ibuprofen , and Imdur [isosorbide nitrate]    Review of Systems  HENT:  Negative for facial swelling.   Respiratory:  Negative for shortness of breath.   Cardiovascular:  Negative for chest pain.  Gastrointestinal:  Negative for abdominal pain.  Musculoskeletal:  Positive for myalgias.  Neurological:  Negative for headaches.  All other systems reviewed and are negative.   Updated Vital Signs BP (!) 160/94   Pulse 88   Temp 98.1 F (36.7 C)   Resp 16   SpO2 95%   Physical Exam Vitals and nursing note reviewed.  Constitutional:      General: She is not in acute distress.    Appearance: Normal appearance. She is well-developed.  HENT:     Head: Normocephalic and atraumatic.     Nose: Nose normal.  Eyes:     Conjunctiva/sclera: Conjunctivae normal.     Pupils: Pupils are equal, round, and reactive to light.      Comments: Normal appearance  Cardiovascular:     Rate and Rhythm: Normal rate and regular rhythm.     Pulses: Normal pulses.     Heart sounds: Normal heart sounds.  Pulmonary:     Effort: Pulmonary effort is normal. No respiratory distress.     Breath sounds: Normal breath sounds.  Abdominal:     General: Bowel sounds are normal. There is no distension.     Palpations: Abdomen is soft. There is no mass.     Tenderness: There is no abdominal tenderness. There is no guarding or rebound.  Genitourinary:    Comments: No CVA tenderness Musculoskeletal:        General: Normal range of motion.     Cervical back: Normal range of motion and neck supple.  Skin:    General: Skin is warm and dry.     Capillary Refill: Capillary refill takes less than 2 seconds.     Findings: No rash.  Neurological:     General: No focal deficit present.     Mental Status: She is alert and oriented to person, place, and time.     Deep Tendon Reflexes: Reflexes normal.  Psychiatric:        Mood and Affect: Mood normal.        Behavior: Behavior normal.     (all labs ordered are listed, but only abnormal results are displayed) Labs Reviewed - No data to display  EKG: None  Radiology: No results found.   Procedures   Medications Ordered in the ED  acetaminophen  (TYLENOL ) tablet 1,000 mg (has no administration in time range)                                    Medical Decision Making Patient with known muscle pain.  No change   Amount and/or Complexity of Data Reviewed External Data Reviewed: radiology and notes.    Details: Previous notes and imaging   Risk OTC drugs. Risk Details: Well appearing.  Exam and vitals are benign and reassuring.  Stable for discharge.       Final diagnoses:  Other chronic pain    No signs of systemic illness or infection. The patient is nontoxic-appearing on exam and vital signs are within normal limits.  I have reviewed the triage vital signs and  the nursing notes. Pertinent labs & imaging results that were available during my care of the patient were reviewed by me and considered in my medical decision making (see chart for details). After history, exam, and medical workup I feel the patient has been appropriately medically screened and is safe for discharge home. Pertinent diagnoses were discussed with the patient.  Patient was given return precautions.    ED Discharge Orders     None          Rosabella Edgin, MD 08/07/23 [phone]

## 2023-08-08 ENCOUNTER — Encounter (HOSPITAL_COMMUNITY): Payer: Self-pay

## 2023-08-08 ENCOUNTER — Other Ambulatory Visit: Payer: Self-pay

## 2023-08-08 ENCOUNTER — Emergency Department (HOSPITAL_COMMUNITY)
Admission: EM | Admit: 2023-08-08 | Discharge: 2023-08-08 | Disposition: A | Discharge status: Home / Self Care | Attending: Emergency Medicine | Admitting: Emergency Medicine

## 2023-08-08 DIAGNOSIS — G8929 Other chronic pain: Secondary | ICD-10-CM | POA: Insufficient documentation

## 2023-08-08 DIAGNOSIS — M791 Myalgia, unspecified site: Secondary | ICD-10-CM | POA: Insufficient documentation

## 2023-08-08 DIAGNOSIS — Z79899 Other long term (current) drug therapy: Secondary | ICD-10-CM | POA: Insufficient documentation

## 2023-08-08 DIAGNOSIS — M542 Cervicalgia: Secondary | ICD-10-CM | POA: Insufficient documentation

## 2023-08-08 DIAGNOSIS — Z7984 Long term (current) use of oral hypoglycemic drugs: Secondary | ICD-10-CM | POA: Insufficient documentation
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
## 2023-08-08 NOTE — ED Provider Notes (Signed)
 [place] EMERGENCY DEPARTMENT AT [facility] Provider Note   CSN: [id number] Arrival date & time: 08/08/23  1545     Patient presents with: Generalized Body Aches   [name] is a 78 y.o. female.   The history is provided by the patient and medical records. No language interpreter was used.     78 year old female well-known to the ER presenting complaining of chronic neck pain body pain.  Please note patient has been seen in the ED every single day for the same complaint.  Prior to Admission medications   Medication Sig Start Date End Date Taking? Authorizing Provider  acetaminophen  (TYLENOL ) 500 MG tablet Take 1 tablet (500 mg total) by mouth every 6 (six) hours as needed. 08/02/23   Elnor Jayson LABOR, DO  albuterol  (VENTOLIN  HFA) 108 (90 Base) MCG/ACT inhaler Inhale 1-2 puffs into the lungs every 6 (six) hours as needed for wheezing or shortness of breath. 04/22/20   Myrna Camelia HERO, NP  amLODipine  (NORVASC ) 10 MG tablet Take 1 tablet (10 mg total) by mouth daily. 01/23/23   Roemhildt, Lorin T, PA-C  chlorthalidone  (HYGROTON ) 25 MG tablet TAKE 1 TABLET (25 MG TOTAL) BY MOUTH DAILY. 11/20/21 11/20/22  Paseda, Folashade R, FNP  diclofenac  Sodium (VOLTAREN ) 1 % GEL Apply 4 g topically 4 (four) times daily. 08/04/23   Hildegard Loge, PA-C  diphenhydrAMINE  (BENADRYL ) 25 MG tablet Take 1 tablet (25 mg total) by mouth every 6 (six) hours as needed for itching or allergies. 10/03/22   [name], PA-C  gabapentin  (NEURONTIN ) 100 MG capsule Take 1 capsule (100 mg total) by mouth 3 (three) times daily as needed. 04/30/23   Bero, Michael M, MD  hydrocortisone  cream 1 % Apply to affected area 2 times daily 10/22/22   Jarold Olam HERO, PA-C  hydrOXYzine  (ATARAX ) 25 MG tablet Take 1 tablet (25 mg total) by mouth every 8 (eight) hours as needed. 11/23/22   Henderly, Britni A, PA-C  lidocaine  (LIDODERM ) 5 % Place 1 patch onto the skin daily. Remove & Discard patch within 12 hours or as directed by MD  08/02/23   Elnor Jayson LABOR, DO  loratadine  (CLARITIN ) 10 MG tablet Take 1 tablet (10 mg total) by mouth daily. 04/06/23   Barrett, Warren SAILOR, PA-C  metFORMIN  (GLUCOPHAGE ) 500 MG tablet Take 1 tablet (500 mg total) by mouth 2 (two) times daily with a meal. 11/03/22 11/03/23  Lang Norleen POUR, PA-C  methocarbamol  (ROBAXIN ) 500 MG tablet Take 1 tablet (500 mg total) by mouth 2 (two) times daily. 03/19/23   Ula Prentice SAUNDERS, MD  metoprolol  succinate (TOPROL -XL) 25 MG 24 hr tablet Take 1 tablet (25 mg total) by mouth daily. 06/18/23   Melvenia Motto, MD  naproxen  (NAPROSYN ) 375 MG tablet Take 1 tablet (375 mg total) by mouth 2 (two) times daily. 03/17/23   Henderly, Britni A, PA-C  pantoprazole  (PROTONIX ) 20 MG tablet Take 1 tablet (20 mg total) by mouth daily. 10/10/21   Dean Clarity, MD  triamcinolone  cream (KENALOG ) 0.1 % Apply 1 Application topically 2 (two) times daily. 07/07/23   Vicky Charleston, PA-C    Allergies: Codeine, Ibuprofen , and Imdur [isosorbide nitrate]    Review of Systems  Constitutional:  Negative for fever.  Musculoskeletal:  Positive for arthralgias.  Skin:  Negative for wound.    Updated Vital Signs BP (!) 144/78   Pulse 60   Temp 98.1 F (36.7 C)   Resp 18   Ht 5' 4 (1.626 m)  Wt 50 kg   SpO2 98%   BMI 18.92 kg/m   Physical Exam Vitals and nursing note reviewed.  Constitutional:      General: She is not in acute distress.    Appearance: She is well-developed.     Comments: Ambulate without difficulty.  HENT:     Head: Atraumatic.  Eyes:     Conjunctiva/sclera: Conjunctivae normal.  Neck:     Comments: Wearing a neck brace Pulmonary:     Effort: Pulmonary effort is normal.  Musculoskeletal:     Cervical back: Neck supple.  Skin:    Findings: No rash.  Neurological:     Mental Status: She is alert.  Psychiatric:        Mood and Affect: Mood normal.     (all labs ordered are listed, but only abnormal results are displayed) Labs Reviewed - No data to  display  EKG: None  Radiology: No results found.   Procedures   Medications Ordered in the ED - No data to display                                  Medical Decision Making  BP (!) 144/78   Pulse 60   Temp 98.1 F (36.7 C)   Resp 18   Ht 5' 4 (1.626 m)   Wt 50 kg   SpO2 98%   BMI 18.92 kg/m   7:43 PM  78 year old female well-known to the ER presenting complaining of chronic neck pain body pain.  Please note patient has been seen in the ED every single day for the same complaint.  Patient wearing a brace, ambulate without a fatigue, at her baseline.  She is well-known to the ED, I have seen and evaluate her multiple times in the past.  She has had extensive workup for her symptoms.  She does not have any new symptoms.  She is stable for discharge.     Final diagnoses:  Chronic neck pain    ED Discharge Orders     None          [name], PA-C 08/08/23 1708    [name], MD 08/08/23 [phone]

## 2023-08-08 NOTE — Discharge Instructions (Addendum)
 You do not have any acute emergent medical problem.  You may follow-up with your doctor for further care.

## 2023-08-08 NOTE — ED Provider Triage Note (Signed)
 Emergency Medicine Provider Triage Evaluation Note  [name] , a 78 y.o. female  was evaluated in triage.  Pt complains of body aches. C/o nek pain, body aches. Frequent er visits for same  Review of Systems  Positive: As above Negative: As above  Physical Exam  BP (!) 144/78   Pulse 60   Temp 98.1 F (36.7 C)   Resp 18   Ht 5' 4 (1.626 m)   Wt 50 kg   SpO2 98%   BMI 18.92 kg/m  Gen:   Awake, no distress   Resp:  Normal effort  MSK:   Moves extremities without difficulty  Other:    Medical Decision Making  Medically screening exam initiated at 4:33 PM.  Appropriate orders placed.  [name] was informed that the remainder of the evaluation will be completed by another provider, this initial triage assessment does not replace that evaluation, and the importance of remaining in the ED until their evaluation is complete.     [name], PA-C 08/08/23 [phone]

## 2023-08-09 ENCOUNTER — Other Ambulatory Visit: Payer: Self-pay

## 2023-08-09 ENCOUNTER — Encounter (HOSPITAL_COMMUNITY): Payer: Self-pay | Admitting: *Deleted

## 2023-08-09 ENCOUNTER — Emergency Department (HOSPITAL_COMMUNITY): Admission: EM | Admit: 2023-08-09 | Discharge: 2023-08-09 | Disposition: A | Discharge status: Home / Self Care

## 2023-08-09 ENCOUNTER — Emergency Department (HOSPITAL_COMMUNITY)
Admission: EM | Admit: 2023-08-09 | Discharge: 2023-08-10 | Disposition: A | Discharge status: Home / Self Care | Attending: Emergency Medicine | Admitting: Emergency Medicine

## 2023-08-09 DIAGNOSIS — M791 Myalgia, unspecified site: Secondary | ICD-10-CM | POA: Diagnosis present

## 2023-08-09 DIAGNOSIS — G8929 Other chronic pain: Secondary | ICD-10-CM | POA: Diagnosis not present

## 2023-08-09 DIAGNOSIS — R52 Pain, unspecified: Secondary | ICD-10-CM

## 2023-08-09 MED ORDER — METHOCARBAMOL 500 MG PO TABS
500.0000 mg | ORAL_TABLET | Freq: Once | ORAL | Status: AC
  Administered 2023-08-09: 500 mg via ORAL
  Filled 2023-08-09: qty 1

## 2023-08-09 NOTE — ED Triage Notes (Signed)
 Pt c/o generalized body aches and says she needs something stronger than tylenol . Soft C-collar in place.

## 2023-08-09 NOTE — ED Triage Notes (Signed)
 C/o generalized bodyache chronic pain
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
## 2023-08-09 NOTE — ED Notes (Signed)
 Patient is upset that she was seen so fast and discharged.

## 2023-08-10 ENCOUNTER — Emergency Department (HOSPITAL_COMMUNITY)
Admission: EM | Admit: 2023-08-10 | Discharge: 2023-08-10 | Disposition: A | Discharge status: Home / Self Care | Attending: Emergency Medicine | Admitting: Emergency Medicine

## 2023-08-10 ENCOUNTER — Encounter (HOSPITAL_COMMUNITY): Payer: Self-pay

## 2023-08-10 ENCOUNTER — Other Ambulatory Visit: Payer: Self-pay

## 2023-08-10 DIAGNOSIS — M7918 Myalgia, other site: Secondary | ICD-10-CM | POA: Diagnosis present

## 2023-08-10 DIAGNOSIS — R519 Headache, unspecified: Secondary | ICD-10-CM | POA: Diagnosis not present

## 2023-08-10 MED ORDER — ACETAMINOPHEN 325 MG PO TABS
650.0000 mg | ORAL_TABLET | Freq: Once | ORAL | Status: AC
  Administered 2023-08-10: 650 mg via ORAL
  Filled 2023-08-10: qty 2

## 2023-08-10 MED ORDER — ACETAMINOPHEN 500 MG PO TABS
1000.0000 mg | ORAL_TABLET | Freq: Once | ORAL | Status: AC
  Administered 2023-08-10: 1000 mg via ORAL
  Filled 2023-08-10: qty 2

## 2023-08-10 NOTE — ED Triage Notes (Signed)
 Pt came in via POV d/t body pain & a HA since [place] the city [place] & reports Tylenol  does not help.
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
## 2023-08-10 NOTE — Discharge Instructions (Addendum)
 Take tylenol for pain.

## 2023-08-10 NOTE — ED Provider Notes (Signed)
 [place] [facility] AT [facility] Provider Note   CSN: [id number] Arrival date & time: 08/09/23  1747     History Chief Complaint  Patient presents with   Generalized Body Aches    HPI [name] is a 78 y.o. female presenting for generalized body aches.  History of similar.  Interestingly, after 7-hour wait in the emergency room, she states that she would like to be discharged so she can go get [employer]. States that she does not want any further evaluation here and wants to make it to [employer] for the close.   Patient's recorded medical, surgical, social, medication list and allergies were reviewed in the Snapshot window as part of the initial history.   Review of Systems   Review of Systems  Constitutional:  Negative for chills and fever.  HENT:  Negative for ear pain and sore throat.   Eyes:  Negative for pain and visual disturbance.  Respiratory:  Negative for cough and shortness of breath.   Cardiovascular:  Negative for chest pain and palpitations.  Gastrointestinal:  Negative for abdominal pain and vomiting.  Genitourinary:  Negative for dysuria and hematuria.  Musculoskeletal:  Positive for arthralgias and myalgias. Negative for back pain.  Skin:  Negative for color change and rash.  Neurological:  Negative for seizures and syncope.  All other systems reviewed and are negative.   Physical Exam Updated Vital Signs BP (!) 109/94   Pulse 61   Temp 98 F (36.7 C)   Resp 16   Ht 5' 4 (1.626 m)   Wt 50 kg   SpO2 100%   BMI 18.92 kg/m  Physical Exam Vitals and nursing note reviewed.  Constitutional:      General: She is not in acute distress.    Appearance: She is well-developed.  HENT:     Head: Normocephalic and atraumatic.  Eyes:     Conjunctiva/sclera: Conjunctivae normal.  Cardiovascular:     Rate and Rhythm: Normal rate and regular rhythm.     Heart sounds: No murmur heard. Pulmonary:     Effort: Pulmonary effort is  normal. No respiratory distress.     Breath sounds: Normal breath sounds.  Abdominal:     General: There is no distension.     Palpations: Abdomen is soft.     Tenderness: There is no abdominal tenderness. There is no right CVA tenderness or left CVA tenderness.  Musculoskeletal:        General: No swelling or tenderness. Normal range of motion.     Cervical back: Neck supple.  Skin:    General: Skin is warm and dry.  Neurological:     General: No focal deficit present.     Mental Status: She is alert and oriented to person, place, and time. Mental status is at baseline.     Cranial Nerves: No cranial nerve deficit.      ED Course/ Medical Decision Making/ A&P    Procedures Procedures   Medications Ordered in ED Medications  acetaminophen  (TYLENOL ) tablet 1,000 mg (has no administration in time range)    Medical Decision Making:   78 year old female with acute on chronic myalgias.  Extensively evaluated.  No new findings on today's exam.  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 Treated with Tylenol  patient ambulatory discharge per her request.  Clinical Impression:  1. Body aches      Discharge   Final Clinical Impression(s) / ED Diagnoses Final diagnoses:  Body aches    Rx /  DC Orders ED Discharge Orders     None         Jerral Meth, MD 08/10/23 [phone]

## 2023-08-13 ENCOUNTER — Other Ambulatory Visit: Payer: Self-pay

## 2023-08-13 ENCOUNTER — Encounter (HOSPITAL_COMMUNITY): Payer: Self-pay

## 2023-08-13 ENCOUNTER — Emergency Department (HOSPITAL_COMMUNITY)
Admission: EM | Admit: 2023-08-13 | Discharge: 2023-08-13 | Disposition: A | Discharge status: Home / Self Care | Attending: Emergency Medicine | Admitting: Emergency Medicine

## 2023-08-13 DIAGNOSIS — I10 Essential (primary) hypertension: Secondary | ICD-10-CM | POA: Insufficient documentation

## 2023-08-13 DIAGNOSIS — E119 Type 2 diabetes mellitus without complications: Secondary | ICD-10-CM | POA: Diagnosis not present

## 2023-08-13 DIAGNOSIS — Z79899 Other long term (current) drug therapy: Secondary | ICD-10-CM | POA: Insufficient documentation

## 2023-08-13 DIAGNOSIS — Z8673 Personal history of transient ischemic attack (TIA), and cerebral infarction without residual deficits: Secondary | ICD-10-CM | POA: Insufficient documentation

## 2023-08-13 DIAGNOSIS — M542 Cervicalgia: Secondary | ICD-10-CM | POA: Diagnosis present

## 2023-08-13 DIAGNOSIS — Z7984 Long term (current) use of oral hypoglycemic drugs: Secondary | ICD-10-CM | POA: Insufficient documentation

## 2023-08-13 MED ORDER — ACETAMINOPHEN 325 MG PO TABS
650.0000 mg | ORAL_TABLET | Freq: Once | ORAL | Status: AC
  Administered 2023-08-13: 650 mg via ORAL

## 2023-08-13 NOTE — ED Notes (Signed)
 Patient Alert and oriented to baseline. Stable and ambulatory to baseline. Patient verbalized understanding of the discharge instructions.  Patient belongings were taken by the patient.
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
## 2023-08-13 NOTE — ED Provider Notes (Signed)
 [place] [facility] DEPARTMENT AT [facility] Provider Note   CSN: [id number] Arrival date & time: 08/13/23  1015     Patient presents with: Pain   [name] is a 78 y.o. female.   Patient here with acute on chronic neck pain.  She denies any new falls.  Nothing makes it worse or better.  She denies any weakness numbness tingling.  No headache.  History of diabetes hypertension stroke.  She is not having any chest pain weakness numbness or shortness of breath abdominal pain.  She has no problem ambulating.  She denies any vision loss.  The history is provided by the patient.       Prior to Admission medications   Medication Sig Start Date End Date Taking? Authorizing Provider  acetaminophen  (TYLENOL ) 500 MG tablet Take 1 tablet (500 mg total) by mouth every 6 (six) hours as needed. 08/02/23   Elnor Jayson LABOR, DO  albuterol  (VENTOLIN  HFA) 108 (90 Base) MCG/ACT inhaler Inhale 1-2 puffs into the lungs every 6 (six) hours as needed for wheezing or shortness of breath. 04/22/20   Myrna Camelia HERO, NP  amLODipine  (NORVASC ) 10 MG tablet Take 1 tablet (10 mg total) by mouth daily. 01/23/23   Roemhildt, Lorin T, PA-C  chlorthalidone  (HYGROTON ) 25 MG tablet TAKE 1 TABLET (25 MG TOTAL) BY MOUTH DAILY. 11/20/21 11/20/22  Paseda, Folashade R, FNP  diclofenac  Sodium (VOLTAREN ) 1 % GEL Apply 4 g topically 4 (four) times daily. 08/04/23   Hildegard Loge, PA-C  diphenhydrAMINE  (BENADRYL ) 25 MG tablet Take 1 tablet (25 mg total) by mouth every 6 (six) hours as needed for itching or allergies. 10/03/22   [name], PA-C  gabapentin  (NEURONTIN ) 100 MG capsule Take 1 capsule (100 mg total) by mouth 3 (three) times daily as needed. 04/30/23   Bero, Michael M, MD  hydrocortisone  cream 1 % Apply to affected area 2 times daily 10/22/22   Jarold Olam HERO, PA-C  hydrOXYzine  (ATARAX ) 25 MG tablet Take 1 tablet (25 mg total) by mouth every 8 (eight) hours as needed. 11/23/22   Henderly, Britni A, PA-C  lidocaine   (LIDODERM ) 5 % Place 1 patch onto the skin daily. Remove & Discard patch within 12 hours or as directed by MD 08/02/23   Elnor Jayson LABOR, DO  loratadine  (CLARITIN ) 10 MG tablet Take 1 tablet (10 mg total) by mouth daily. 04/06/23   Barrett, Warren SAILOR, PA-C  metFORMIN  (GLUCOPHAGE ) 500 MG tablet Take 1 tablet (500 mg total) by mouth 2 (two) times daily with a meal. 11/03/22 11/03/23  Lang Norleen POUR, PA-C  methocarbamol  (ROBAXIN ) 500 MG tablet Take 1 tablet (500 mg total) by mouth 2 (two) times daily. 03/19/23   Ula Prentice SAUNDERS, MD  metoprolol  succinate (TOPROL -XL) 25 MG 24 hr tablet Take 1 tablet (25 mg total) by mouth daily. 06/18/23   Melvenia Motto, MD  naproxen  (NAPROSYN ) 375 MG tablet Take 1 tablet (375 mg total) by mouth 2 (two) times daily. 03/17/23   Henderly, Britni A, PA-C  pantoprazole  (PROTONIX ) 20 MG tablet Take 1 tablet (20 mg total) by mouth daily. 10/10/21   Dean Clarity, MD  triamcinolone  cream (KENALOG ) 0.1 % Apply 1 Application topically 2 (two) times daily. 07/07/23   Vicky Charleston, PA-C    Allergies: Codeine, Ibuprofen , and Imdur [isosorbide nitrate]    Review of Systems  Updated Vital Signs BP (!) 205/58   Pulse 63   Temp 98.6 F (37 C) (Oral)   Resp 16  Ht 5' 4 (1.626 m)   Wt 50 kg   SpO2 100%   BMI 18.92 kg/m   Physical Exam Vitals and nursing note reviewed.  Constitutional:      General: She is not in acute distress.    Appearance: She is well-developed. She is not ill-appearing.  HENT:     Head: Normocephalic and atraumatic.     Nose: Nose normal.     Mouth/Throat:     Mouth: Mucous membranes are moist.  Eyes:     Extraocular Movements: Extraocular movements intact.     Conjunctiva/sclera: Conjunctivae normal.     Pupils: Pupils are equal, round, and reactive to light.  Neck:     Comments: Some paraspinal tenderness of the cervical muscles on the left, no midline spinal tenderness Cardiovascular:     Rate and Rhythm: Normal rate and regular rhythm.      Heart sounds: No murmur heard. Pulmonary:     Effort: Pulmonary effort is normal. No respiratory distress.     Breath sounds: Normal breath sounds.  Abdominal:     Palpations: Abdomen is soft.     Tenderness: There is no abdominal tenderness.  Musculoskeletal:        General: No swelling.     Cervical back: Normal range of motion and neck supple.  Skin:    General: Skin is warm and dry.     Capillary Refill: Capillary refill takes less than 2 seconds.  Neurological:     General: No focal deficit present.     Mental Status: She is alert and oriented to person, place, and time.     Cranial Nerves: No cranial nerve deficit.     Sensory: No sensory deficit.     Motor: No weakness.     Comments: 5+ out of 5 strength throughout, normal sensation, no drift, normal finger-nose-finger, normal speech  Psychiatric:        Mood and Affect: Mood normal.     (all labs ordered are listed, but only abnormal results are displayed) Labs Reviewed - No data to display  EKG: None  Radiology: No results found.   Procedures   Medications Ordered in the ED  acetaminophen  (TYLENOL ) tablet 650 mg (650 mg Oral Given 08/13/23 1034)                                    Medical Decision Making Risk OTC drugs.   [name] is here with acute on chronic neck pain.  History of the same.  Blood pressure is little bit elevated but otherwise she has no stroke symptoms no chest pain.  She is well-appearing.  Neurologically intact.  Multiple visits for the same.  She seems to be at her baseline.  I know the patient very well.  She is requesting Tylenol .  This is given to her.  I do not think she needs any further workup at this time.  She has had blood work recently that was unremarkable.  An MRI of her brain a couple months ago that was unremarkable.  She is not exhibiting any stroke symptoms.  Blood pressure being elevated likely secondary to pain.  She has got good strength and sensation.  I do not have  any concern for any spinal cord process at this time.  Recommend continued supportive care at home.  Patient discharged.  This chart was dictated using voice recognition software.  Despite best  efforts to proofread,  errors can occur which can change the documentation meaning.      Final diagnoses:  Neck pain    ED Discharge Orders     None          Ruthe Cornet, DO 08/13/23 1053

## 2023-08-14 ENCOUNTER — Other Ambulatory Visit: Payer: Self-pay

## 2023-08-14 ENCOUNTER — Emergency Department (HOSPITAL_COMMUNITY)
Admission: EM | Admit: 2023-08-14 | Discharge: 2023-08-15 | Disposition: A | Discharge status: Home / Self Care | Attending: Emergency Medicine | Admitting: Emergency Medicine

## 2023-08-14 ENCOUNTER — Encounter (HOSPITAL_COMMUNITY): Payer: Self-pay

## 2023-08-14 DIAGNOSIS — Z602 Problems related to living alone: Secondary | ICD-10-CM | POA: Insufficient documentation

## 2023-08-14 DIAGNOSIS — Z79899 Other long term (current) drug therapy: Secondary | ICD-10-CM | POA: Diagnosis not present

## 2023-08-14 DIAGNOSIS — M791 Myalgia, unspecified site: Secondary | ICD-10-CM | POA: Diagnosis present

## 2023-08-14 DIAGNOSIS — M542 Cervicalgia: Secondary | ICD-10-CM | POA: Insufficient documentation

## 2023-08-14 DIAGNOSIS — I10 Essential (primary) hypertension: Secondary | ICD-10-CM | POA: Insufficient documentation

## 2023-08-14 DIAGNOSIS — Z8673 Personal history of transient ischemic attack (TIA), and cerebral infarction without residual deficits: Secondary | ICD-10-CM | POA: Insufficient documentation

## 2023-08-14 DIAGNOSIS — R001 Bradycardia, unspecified: Secondary | ICD-10-CM | POA: Diagnosis not present

## 2023-08-14 DIAGNOSIS — E119 Type 2 diabetes mellitus without complications: Secondary | ICD-10-CM | POA: Diagnosis not present

## 2023-08-14 DIAGNOSIS — Z7984 Long term (current) use of oral hypoglycemic drugs: Secondary | ICD-10-CM | POA: Diagnosis not present

## 2023-08-14 DIAGNOSIS — M7918 Myalgia, other site: Secondary | ICD-10-CM | POA: Diagnosis present

## 2023-08-14 DIAGNOSIS — G8929 Other chronic pain: Secondary | ICD-10-CM | POA: Diagnosis not present

## 2023-08-14 DIAGNOSIS — Z7689 Persons encountering health services in other specified circumstances: Secondary | ICD-10-CM | POA: Diagnosis not present

## 2023-08-14 DIAGNOSIS — R52 Pain, unspecified: Secondary | ICD-10-CM

## 2023-08-14 NOTE — ED Triage Notes (Signed)
 Pt came in via POV d/t chronic body pain.
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
## 2023-08-14 NOTE — ED Provider Notes (Incomplete)
 [place] EMERGENCY DEPARTMENT AT [facility] Provider Note   CSN: [id number] Arrival date & time: 08/14/23  8277     Patient presents with: Body Pain    [name] is a 78 y.o. female.  {Add pertinent medical, surgical, social history, OB history to HPI:32917} HPI   78 year old female history of hypertension, stroke, GERD, diabetes, frequent ER visit presenting with complaints of body pain.  Please note this is patient's 124th visit within the past 6 months.  Prior to Admission medications   Medication Sig Start Date End Date Taking? Authorizing Provider  acetaminophen  (TYLENOL ) 500 MG tablet Take 1 tablet (500 mg total) by mouth every 6 (six) hours as needed. 08/02/23   Elnor Jayson LABOR, DO  albuterol  (VENTOLIN  HFA) 108 (90 Base) MCG/ACT inhaler Inhale 1-2 puffs into the lungs every 6 (six) hours as needed for wheezing or shortness of breath. 04/22/20   Myrna Camelia HERO, NP  amLODipine  (NORVASC ) 10 MG tablet Take 1 tablet (10 mg total) by mouth daily. 01/23/23   Roemhildt, Lorin T, PA-C  chlorthalidone  (HYGROTON ) 25 MG tablet TAKE 1 TABLET (25 MG TOTAL) BY MOUTH DAILY. 11/20/21 11/20/22  Paseda, Folashade R, FNP  diclofenac  Sodium (VOLTAREN ) 1 % GEL Apply 4 g topically 4 (four) times daily. 08/04/23   Hildegard Loge, PA-C  diphenhydrAMINE  (BENADRYL ) 25 MG tablet Take 1 tablet (25 mg total) by mouth every 6 (six) hours as needed for itching or allergies. 10/03/22   [name], PA-C  gabapentin  (NEURONTIN ) 100 MG capsule Take 1 capsule (100 mg total) by mouth 3 (three) times daily as needed. 04/30/23   Bero, Michael M, MD  hydrocortisone  cream 1 % Apply to affected area 2 times daily 10/22/22   Jarold Olam HERO, PA-C  hydrOXYzine  (ATARAX ) 25 MG tablet Take 1 tablet (25 mg total) by mouth every 8 (eight) hours as needed. 11/23/22   Henderly, Britni A, PA-C  lidocaine  (LIDODERM ) 5 % Place 1 patch onto the skin daily. Remove & Discard patch within 12 hours or as directed by MD 08/02/23   Elnor Jayson A, DO  loratadine  (CLARITIN ) 10 MG tablet Take 1 tablet (10 mg total) by mouth daily. 04/06/23   Barrett, Warren SAILOR, PA-C  metFORMIN  (GLUCOPHAGE ) 500 MG tablet Take 1 tablet (500 mg total) by mouth 2 (two) times daily with a meal. 11/03/22 11/03/23  Lang Norleen POUR, PA-C  methocarbamol  (ROBAXIN ) 500 MG tablet Take 1 tablet (500 mg total) by mouth 2 (two) times daily. 03/19/23   Ula Prentice SAUNDERS, MD  metoprolol  succinate (TOPROL -XL) 25 MG 24 hr tablet Take 1 tablet (25 mg total) by mouth daily. 06/18/23   Melvenia Motto, MD  naproxen  (NAPROSYN ) 375 MG tablet Take 1 tablet (375 mg total) by mouth 2 (two) times daily. 03/17/23   Henderly, Britni A, PA-C  pantoprazole  (PROTONIX ) 20 MG tablet Take 1 tablet (20 mg total) by mouth daily. 10/10/21   Dean Clarity, MD  triamcinolone  cream (KENALOG ) 0.1 % Apply 1 Application topically 2 (two) times daily. 07/07/23   Vicky Charleston, PA-C    Allergies: Codeine, Ibuprofen , and Imdur [isosorbide nitrate]    Review of Systems  Updated Vital Signs BP (!) 218/80 (BP Location: Right Arm)   Pulse (!) 54   Temp 98.3 F (36.8 C) (Oral)   Resp 15   SpO2 97%   Physical Exam  (all labs ordered are listed, but only abnormal results are displayed) Labs Reviewed - No data to display  EKG: None  Radiology: No results found.  {Document cardiac monitor, telemetry assessment procedure when appropriate:32947} Procedures   Medications Ordered in the ED - No data to display    {Click here for ABCD2, HEART and other calculators REFRESH Note before signing:1}                              Medical Decision Making  ***  {Document critical care time when appropriate  Document review of labs and clinical decision tools ie CHADS2VASC2, etc  Document your independent review of radiology images and any outside records  Document your discussion with family members, caretakers and with consultants  Document social determinants of health affecting pt's care   Document your decision making why or why not admission, treatments were needed:32947:::1}   Final diagnoses:  None    ED Discharge Orders     None

## 2023-08-14 NOTE — ED Triage Notes (Signed)
 Pt complains of 10/10 generalized body aches.

## 2023-08-15 ENCOUNTER — Other Ambulatory Visit: Payer: Self-pay

## 2023-08-15 ENCOUNTER — Encounter (HOSPITAL_COMMUNITY): Payer: Self-pay

## 2023-08-15 ENCOUNTER — Emergency Department (HOSPITAL_COMMUNITY)
Admission: EM | Admit: 2023-08-15 | Discharge: 2023-08-15 | Disposition: A | Discharge status: Home / Self Care | Source: Home / Self Care | Attending: Emergency Medicine | Admitting: Emergency Medicine

## 2023-08-15 DIAGNOSIS — Z79899 Other long term (current) drug therapy: Secondary | ICD-10-CM | POA: Insufficient documentation

## 2023-08-15 DIAGNOSIS — I10 Essential (primary) hypertension: Secondary | ICD-10-CM | POA: Insufficient documentation

## 2023-08-15 DIAGNOSIS — M7918 Myalgia, other site: Secondary | ICD-10-CM | POA: Diagnosis not present

## 2023-08-15 DIAGNOSIS — Z7689 Persons encountering health services in other specified circumstances: Secondary | ICD-10-CM | POA: Insufficient documentation

## 2023-08-15 DIAGNOSIS — Z7984 Long term (current) use of oral hypoglycemic drugs: Secondary | ICD-10-CM | POA: Insufficient documentation

## 2023-08-15 DIAGNOSIS — E119 Type 2 diabetes mellitus without complications: Secondary | ICD-10-CM | POA: Insufficient documentation

## 2023-08-15 MED ORDER — AMLODIPINE BESYLATE 5 MG PO TABS
10.0000 mg | ORAL_TABLET | Freq: Once | ORAL | Status: AC
  Administered 2023-08-15: 10 mg via ORAL
  Filled 2023-08-15: qty 2

## 2023-08-15 MED ORDER — ACETAMINOPHEN 325 MG PO TABS
650.0000 mg | ORAL_TABLET | Freq: Once | ORAL | Status: AC
  Administered 2023-08-15: 650 mg via ORAL
  Filled 2023-08-15: qty 2

## 2023-08-15 MED ORDER — LIDOCAINE 5 % EX PTCH
1.0000 | MEDICATED_PATCH | Freq: Once | CUTANEOUS | Status: DC
  Administered 2023-08-15: 1 via TRANSDERMAL
  Filled 2023-08-15: qty 1

## 2023-08-15 MED ORDER — ACETAMINOPHEN 500 MG PO TABS
1000.0000 mg | ORAL_TABLET | Freq: Once | ORAL | Status: AC
  Administered 2023-08-15: 1000 mg via ORAL
  Filled 2023-08-15: qty 2

## 2023-08-15 MED ORDER — DICLOFENAC SODIUM 1 % EX GEL: 4.0000 g | Freq: Four times a day (QID) | CUTANEOUS | 0 refills | Status: DC

## 2023-08-15 NOTE — Discharge Instructions (Signed)
 RESOURCE GUIDE  Chronic Pain Problems: Contact [facility]  [phone] Patients need to be referred by their primary care doctor.  Insufficient Money for Medicine: Contact [employer]:  call [phone]  No Primary Care Doctor: Call [facility]  [phone] - can help you locate a primary care doctor that  accepts your insurance, provides certain services, etc. Physician Referral Service- [phone]  Agencies that provide inexpensive medical care: [name]  [phone] [facility]  [phone] [facility]  [phone] [facility]  [phone] [facility]  [phone] [facility]  [phone]  Medicaid-accepting [facility] Providers: [facility]- [address]  [phone], Mon-Fri 9am-7pm, Sat 9am-1pm [facility]- [address] [place], [address] [place]  [phone] [facility]- [address], [address] [place]  [phone] [facility]- [address]  [phone] Renaye Rakers- [address] [place], [address], [phone]  Only accepts [place] patients after they have their name  applied to their card  Self Pay (no insurance) in [facility]: Sickle Cell Patients - [facility]  [address] [place], [phone] [facility]gent Care- [address] [place]  [phone]       [name] [place]- [address]       -     [facility]- see information above (Speak to [employer] if you do not have insurance)       -  [facility]- [address],  [phone]       -  [facility]- [address] [phone]       -  Dr Julio Sicks-  [address], [place], [phone]       -  [facility] - [address] [phone]       -  [facility]- [address] [phone], also [address],  [phone]       -     [facility]- [address] [place], [phone], 1st & 3rd Saturday         every month, 10am-1pm  -     [facility]   [address] [place], [place].   Phone:  [phone], Fax:  [phone]. Hours of Operation:  9 am - 6 pm, M-F.  -     [facility]   [address], [place]   Phone: [phone], Fax: [phone]. Hours of Operation:  8:30 am - 5:30 pm, M-F.    Dental Assistance If unable to pay or uninsured, contact:  [facility]. to become qualified for the adult dental clinic.  Patients with Medicaid: [facility] [phone] [address], [phone] [address], [phone]  If unable to pay, or uninsured, contact [facility] [phone] in [place], [phone] in [facility]) to become qualified for the adult dental clinic  [facility] [address] [place], [place] [address] [phone] [email or web address]  Other Proofreader Services: [facility]- [address] [place], [place], [place], [address], [phone], Ext. [phone], 2nd and 4th Thursday of the month at 6:30am.  10 clients each day by appointment, can sometimes see walk-in patients if someone does not show for an appointment.  [facility]- [address] [place], [place], [address], [phone] [facility] [address] [place], [place], [address], [phone] [facility] Health Department- [phone] [facility] Health Department- [phone] [facility] [phone]
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
## 2023-08-15 NOTE — Discharge Instructions (Signed)
 You may continue to use Voltaren  gel for your joint pain.  Make sure to take your blood pressure medication regularly as your blood pressure is elevated today.
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
## 2023-08-16 ENCOUNTER — Emergency Department (HOSPITAL_COMMUNITY)
Admission: EM | Admit: 2023-08-16 | Discharge: 2023-08-16 | Disposition: A | Discharge status: Home / Self Care | Attending: Emergency Medicine | Admitting: Emergency Medicine

## 2023-08-16 ENCOUNTER — Emergency Department (HOSPITAL_COMMUNITY)
Admission: EM | Admit: 2023-08-16 | Discharge: 2023-08-17 | Disposition: A | Discharge status: Home / Self Care | Attending: Emergency Medicine | Admitting: Emergency Medicine

## 2023-08-16 ENCOUNTER — Encounter (HOSPITAL_COMMUNITY): Payer: Self-pay

## 2023-08-16 ENCOUNTER — Other Ambulatory Visit: Payer: Self-pay

## 2023-08-16 ENCOUNTER — Emergency Department (HOSPITAL_COMMUNITY)

## 2023-08-16 DIAGNOSIS — R519 Headache, unspecified: Secondary | ICD-10-CM | POA: Insufficient documentation

## 2023-08-16 DIAGNOSIS — W19XXXA Unspecified fall, initial encounter: Secondary | ICD-10-CM | POA: Insufficient documentation

## 2023-08-16 DIAGNOSIS — Y92811 Bus as the place of occurrence of the external cause: Secondary | ICD-10-CM | POA: Insufficient documentation

## 2023-08-16 DIAGNOSIS — M542 Cervicalgia: Secondary | ICD-10-CM | POA: Insufficient documentation

## 2023-08-16 DIAGNOSIS — M25552 Pain in left hip: Secondary | ICD-10-CM | POA: Insufficient documentation

## 2023-08-16 DIAGNOSIS — S0031XA Abrasion of nose, initial encounter: Secondary | ICD-10-CM | POA: Insufficient documentation

## 2023-08-16 DIAGNOSIS — S0992XA Unspecified injury of nose, initial encounter: Secondary | ICD-10-CM | POA: Diagnosis present

## 2023-08-16 DIAGNOSIS — R52 Pain, unspecified: Secondary | ICD-10-CM | POA: Insufficient documentation

## 2023-08-16 DIAGNOSIS — S80212A Abrasion, left knee, initial encounter: Secondary | ICD-10-CM | POA: Insufficient documentation

## 2023-08-16 MED ORDER — ACETAMINOPHEN 325 MG PO TABS
650.0000 mg | ORAL_TABLET | Freq: Once | ORAL | Status: AC
  Administered 2023-08-16: 650 mg via ORAL
  Filled 2023-08-16: qty 2
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
## 2023-08-16 NOTE — ED Provider Triage Note (Signed)
 Emergency Medicine Provider Triage Evaluation Note  Devona Holmes , a 78 y.o. female  was evaluated in triage.  Pt complains of fall yesterday and now her whole body hurts. She is in her soft collar. No appear acute change in symptoms. Stable and well appearing.   Review of Systems  Positive: Body aches Negative: Fever  Physical Exam  BP (!) 123/102 (BP Location: Right Arm)   Pulse (!) 58   Temp 98 F (36.7 C)   Resp 16   SpO2 99%  Gen:   Awake, no distress   Resp:  Normal effort  MSK:   Moves extremities without difficulty  Other:    Medical Decision Making  Medically screening exam initiated at 2:21 PM.  Appropriate orders placed.  Liset Mcmonigle was informed that the remainder of the evaluation will be completed by another provider, this initial triage assessment does not replace that evaluation, and the importance of remaining in the ED until their evaluation is complete.     Shermon Warren SAILOR, PA-C 08/16/23 1424
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
## 2023-08-16 NOTE — Discharge Instructions (Signed)
 Follow up with your primary care provider.  If you develop new or worsening symptoms return to the ER.

## 2023-08-17 ENCOUNTER — Other Ambulatory Visit: Payer: Self-pay

## 2023-08-17 ENCOUNTER — Encounter (HOSPITAL_COMMUNITY): Payer: Self-pay | Admitting: *Deleted

## 2023-08-17 ENCOUNTER — Emergency Department (HOSPITAL_COMMUNITY)
Admission: EM | Admit: 2023-08-17 | Discharge: 2023-08-17 | Disposition: A | Discharge status: Home / Self Care | Attending: Emergency Medicine | Admitting: Emergency Medicine

## 2023-08-17 DIAGNOSIS — M791 Myalgia, unspecified site: Secondary | ICD-10-CM | POA: Diagnosis present

## 2023-08-17 DIAGNOSIS — Z7984 Long term (current) use of oral hypoglycemic drugs: Secondary | ICD-10-CM | POA: Diagnosis not present

## 2023-08-17 DIAGNOSIS — R52 Pain, unspecified: Secondary | ICD-10-CM

## 2023-08-17 DIAGNOSIS — Z79899 Other long term (current) drug therapy: Secondary | ICD-10-CM | POA: Insufficient documentation

## 2023-08-17 MED ORDER — IBUPROFEN 400 MG PO TABS
600.0000 mg | ORAL_TABLET | Freq: Once | ORAL | Status: AC
  Administered 2023-08-17: 600 mg via ORAL
  Filled 2023-08-17: qty 1

## 2023-08-17 MED ORDER — ACETAMINOPHEN 325 MG PO TABS
650.0000 mg | ORAL_TABLET | Freq: Once | ORAL | Status: AC
  Administered 2023-08-17: 650 mg via ORAL
  Filled 2023-08-17: qty 2

## 2023-08-17 MED ORDER — CYCLOBENZAPRINE HCL 10 MG PO TABS
5.0000 mg | ORAL_TABLET | Freq: Once | ORAL | Status: AC
  Administered 2023-08-17: 5 mg via ORAL
  Filled 2023-08-17: qty 1

## 2023-08-17 NOTE — ED Provider Notes (Signed)
 [place] EMERGENCY DEPARTMENT AT [facility] Provider Note   CSN: [id number] Arrival date & time: 08/17/23  2025     Patient presents with: body pain   [name] is a 78 y.o. female here for body pain  Seen yesterday for fall, had multiple scans done  She is here nearly every night  Patient not wanting to be discharged into the waiting room because last time they made me go outside and I can't go out when it's dark.    HPI     Prior to Admission medications   Medication Sig Start Date End Date Taking? Authorizing Provider  acetaminophen  (TYLENOL ) 500 MG tablet Take 1 tablet (500 mg total) by mouth every 6 (six) hours as needed. 08/02/23   Elnor Jayson LABOR, DO  albuterol  (VENTOLIN  HFA) 108 (90 Base) MCG/ACT inhaler Inhale 1-2 puffs into the lungs every 6 (six) hours as needed for wheezing or shortness of breath. 04/22/20   Myrna Camelia HERO, NP  amLODipine  (NORVASC ) 10 MG tablet Take 1 tablet (10 mg total) by mouth daily. 01/23/23   Roemhildt, Lorin T, PA-C  chlorthalidone  (HYGROTON ) 25 MG tablet TAKE 1 TABLET (25 MG TOTAL) BY MOUTH DAILY. 11/20/21 11/20/22  Paseda, Folashade R, FNP  diclofenac  Sodium (VOLTAREN ) 1 % GEL Apply 4 g topically 4 (four) times daily. 08/15/23   [name], PA-C  diphenhydrAMINE  (BENADRYL ) 25 MG tablet Take 1 tablet (25 mg total) by mouth every 6 (six) hours as needed for itching or allergies. 10/03/22   [name], PA-C  gabapentin  (NEURONTIN ) 100 MG capsule Take 1 capsule (100 mg total) by mouth 3 (three) times daily as needed. 04/30/23   Bero, Michael M, MD  hydrocortisone  cream 1 % Apply to affected area 2 times daily 10/22/22   Jarold Olam HERO, PA-C  hydrOXYzine  (ATARAX ) 25 MG tablet Take 1 tablet (25 mg total) by mouth every 8 (eight) hours as needed. 11/23/22   Henderly, Britni A, PA-C  lidocaine  (LIDODERM ) 5 % Place 1 patch onto the skin daily. Remove & Discard patch within 12 hours or as directed by MD 08/02/23   Elnor Jayson A, DO   loratadine  (CLARITIN ) 10 MG tablet Take 1 tablet (10 mg total) by mouth daily. 04/06/23   Barrett, Warren SAILOR, PA-C  metFORMIN  (GLUCOPHAGE ) 500 MG tablet Take 1 tablet (500 mg total) by mouth 2 (two) times daily with a meal. 11/03/22 11/03/23  Lang Norleen POUR, PA-C  methocarbamol  (ROBAXIN ) 500 MG tablet Take 1 tablet (500 mg total) by mouth 2 (two) times daily. 03/19/23   Ula Prentice SAUNDERS, MD  metoprolol  succinate (TOPROL -XL) 25 MG 24 hr tablet Take 1 tablet (25 mg total) by mouth daily. 06/18/23   Melvenia Motto, MD  naproxen  (NAPROSYN ) 375 MG tablet Take 1 tablet (375 mg total) by mouth 2 (two) times daily. 03/17/23   Henderly, Britni A, PA-C  pantoprazole  (PROTONIX ) 20 MG tablet Take 1 tablet (20 mg total) by mouth daily. 10/10/21   Dean Clarity, MD  triamcinolone  cream (KENALOG ) 0.1 % Apply 1 Application topically 2 (two) times daily. 07/07/23   Vicky Charleston, PA-C    Allergies: Codeine, Ibuprofen , and Imdur [isosorbide nitrate]    Review of Systems  Updated Vital Signs BP (!) 172/58 (BP Location: Right Arm)   Pulse (!) 53   Temp 98.1 F (36.7 C)   Resp 20   Ht 5' 4 (1.626 m)   Wt 50 kg   SpO2 95%   BMI 18.92 kg/m  Physical Exam Constitutional:      General: She is not in acute distress. HENT:     Head: Normocephalic and atraumatic.  Eyes:     Conjunctiva/sclera: Conjunctivae normal.     Pupils: Pupils are equal, round, and reactive to light.  Cardiovascular:     Rate and Rhythm: Normal rate and regular rhythm.  Pulmonary:     Effort: Pulmonary effort is normal. No respiratory distress.  Abdominal:     General: There is no distension.     Tenderness: There is no abdominal tenderness.  Skin:    General: Skin is warm and dry.  Neurological:     General: No focal deficit present.     Mental Status: She is alert. Mental status is at baseline.  Psychiatric:        Mood and Affect: Mood normal.        Behavior: Behavior normal.     (all labs ordered are listed, but only  abnormal results are displayed) Labs Reviewed - No data to display  EKG: None  Radiology: DG Knee Complete 4 Views Left Result Date: 08/16/2023 CLINICAL DATA:  Fall, left knee pain EXAM: LEFT KNEE - COMPLETE 4+ VIEW COMPARISON:  None Available. FINDINGS: No evidence of fracture, dislocation, or joint effusion. Mild degenerative enthesopathy involving the insertion of the quadriceps tendon upon the patella. No evidence of arthropathy or other focal bone abnormality. Soft tissues are unremarkable. IMPRESSION: 1. Negative. Electronically Signed   By: Dorethia Molt M.D.   On: 08/16/2023 19:32   DG Hip Unilat W or Wo Pelvis 2-3 Views Left Result Date: 08/16/2023 CLINICAL DATA:  Fall, left hip pain EXAM: DG HIP (WITH OR WITHOUT PELVIS) 2-3V LEFT COMPARISON:  None Available. FINDINGS: Normal alignment. No acute fracture or dislocation. Mild bilateral degenerative hip arthritis. Sacroiliac joint spaces are preserved. Soft tissues are unremarkable. IMPRESSION: 1. Mild bilateral degenerative hip arthritis. Electronically Signed   By: Dorethia Molt M.D.   On: 08/16/2023 19:31   CT Head Wo Contrast Result Date: 08/16/2023 CLINICAL DATA:  Head trauma, penetrating; Facial trauma, blunt; Polytrauma, blunt. Fall, facial injury, facial pain, headache. EXAM: CT HEAD WITHOUT CONTRAST CT MAXILLOFACIAL WITHOUT CONTRAST CT CERVICAL SPINE WITHOUT CONTRAST TECHNIQUE: Multidetector CT imaging of the head, cervical spine, and maxillofacial structures were performed using the standard protocol without intravenous contrast. Multiplanar CT image reconstructions of the cervical spine and maxillofacial structures were also generated. RADIATION DOSE REDUCTION: This exam was performed according to the departmental dose-optimization program which includes automated exposure control, adjustment of the mA and/or kV according to patient size and/or use of iterative reconstruction technique. COMPARISON:  None Available. FINDINGS: CT HEAD  FINDINGS Brain: Normal anatomic configuration. No abnormal intra or extra-axial mass lesion or fluid collection. No abnormal mass effect or midline shift. No evidence of acute intracranial hemorrhage or infarct. Ventricular size is normal. Cerebellum unremarkable. Vascular: Unremarkable Skull: Intact Other: Mastoid air cells and middle ear cavities are clear. CT MAXILLOFACIAL FINDINGS Osseous: No acute facial fracture. Numerous dental erosions, absent teeth, and periapical lucencies. No mandibular dislocation. Orbits: Negative. No traumatic or inflammatory finding. Sinuses: Clear. Soft tissues: Negative. CT CERVICAL SPINE FINDINGS Alignment: Normal. Skull base and vertebrae: Craniocervical alignment is normal. The atlantodental interval is not widened. C3-C7 anterior cervical discectomy and fusion with instrumentation has been performed with bridging callus involving the fused segments. 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 No acute fracture. Vertebral body height is preserved. 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 No high-grade canal stenosis. No canal hematoma. No prevertebral soft tissue swelling or paraspinal fluid collection. Disc levels: C2-3 and C7-T1 residual disc spaces are preserved. 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 Prevertebral soft tissues are not thickened on sagittal reformats. Upper chest: Negative. Other: None IMPRESSION: 1. No acute intracranial abnormality is seen. 2. No acute facial fracture. 3. Extensive periodontal disease. 4. No acute fracture or malalignment of the cervical spine. 5. C3-C7 anterior cervical discectomy and fusion with instrumentation has been performed with bridging callus involving the fused segments. 6. 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 Electronically Signed   By: Dorethia Molt M.D.   On: 08/16/2023 19:27   CT Maxillofacial Wo Contrast Result Date: 08/16/2023 CLINICAL DATA:  Head trauma, penetrating; Facial trauma, blunt; Polytrauma, blunt. Fall, facial injury, facial pain, headache. EXAM: CT HEAD WITHOUT CONTRAST CT MAXILLOFACIAL WITHOUT CONTRAST CT CERVICAL SPINE WITHOUT CONTRAST TECHNIQUE: Multidetector CT imaging of the head, cervical spine, and maxillofacial structures were performed using the standard protocol without intravenous contrast. Multiplanar CT image reconstructions of the cervical spine and maxillofacial structures were also generated. RADIATION DOSE REDUCTION: This exam was performed according to the departmental dose-optimization program which includes automated exposure control, adjustment of the mA and/or kV according to patient size and/or use of iterative reconstruction technique. COMPARISON:  None Available. FINDINGS: CT HEAD FINDINGS Brain: Normal anatomic configuration. No abnormal intra or extra-axial mass lesion or fluid collection. No abnormal mass effect or midline shift. No evidence of acute intracranial hemorrhage or infarct. Ventricular size is normal. Cerebellum unremarkable. Vascular: Unremarkable Skull: Intact Other: Mastoid air cells and middle ear cavities are clear. CT MAXILLOFACIAL FINDINGS Osseous: No acute facial fracture. Numerous dental erosions, absent teeth, and periapical lucencies. No mandibular dislocation. Orbits: Negative. No traumatic or inflammatory finding. Sinuses: Clear. Soft tissues: Negative. CT CERVICAL SPINE FINDINGS Alignment: Normal. Skull base and vertebrae: Craniocervical alignment is normal. The atlantodental interval is not widened. C3-C7 anterior cervical discectomy and fusion with instrumentation has been performed with bridging callus involving the fused segments. 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 No acute fracture. Vertebral body height is  preserved. 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 No high-grade canal stenosis. No canal hematoma. No prevertebral soft tissue swelling or paraspinal fluid collection. Disc levels: C2-3 and C7-T1 residual disc spaces are preserved. 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 Prevertebral soft tissues are not thickened on sagittal reformats. Upper chest: Negative. Other: None IMPRESSION: 1. No acute intracranial abnormality is seen. 2. No acute facial fracture. 3. Extensive periodontal disease. 4. No acute fracture or malalignment of the cervical spine. 5. C3-C7 anterior cervical discectomy and fusion with instrumentation has been performed with bridging callus involving the fused segments. 6. 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 Electronically Signed   By: Dorethia Molt M.D.   On: 08/16/2023 19:27   CT Cervical Spine Wo Contrast Result Date: 08/16/2023 CLINICAL DATA:  Head trauma, penetrating; Facial trauma, blunt; Polytrauma, blunt. Fall, facial injury, facial pain, headache. EXAM: CT HEAD WITHOUT CONTRAST CT MAXILLOFACIAL WITHOUT CONTRAST CT CERVICAL SPINE WITHOUT CONTRAST TECHNIQUE: Multidetector CT imaging of the head, cervical spine, and maxillofacial structures  were performed using the standard protocol without intravenous contrast. Multiplanar CT image reconstructions of the cervical spine and maxillofacial structures were also generated. RADIATION DOSE REDUCTION: This exam was performed according to the departmental dose-optimization program which includes automated exposure control, adjustment of the mA and/or kV according to patient size and/or use of iterative reconstruction technique. COMPARISON:  None Available. FINDINGS: CT HEAD FINDINGS Brain: Normal anatomic configuration. No  abnormal intra or extra-axial mass lesion or fluid collection. No abnormal mass effect or midline shift. No evidence of acute intracranial hemorrhage or infarct. Ventricular size is normal. Cerebellum unremarkable. Vascular: Unremarkable Skull: Intact Other: Mastoid air cells and middle ear cavities are clear. CT MAXILLOFACIAL FINDINGS Osseous: No acute facial fracture. Numerous dental erosions, absent teeth, and periapical lucencies. No mandibular dislocation. Orbits: Negative. No traumatic or inflammatory finding. Sinuses: Clear. Soft tissues: Negative. CT CERVICAL SPINE FINDINGS Alignment: Normal. Skull base and vertebrae: Craniocervical alignment is normal. The atlantodental interval is not widened. C3-C7 anterior cervical discectomy and fusion with instrumentation has been performed with bridging callus involving the fused segments. 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 No acute fracture. Vertebral body height is preserved. 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 No high-grade canal stenosis. No canal hematoma. No prevertebral soft tissue swelling or paraspinal fluid collection. Disc levels: C2-3 and C7-T1 residual disc spaces are preserved. 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 Prevertebral soft tissues are not thickened on sagittal reformats. Upper chest: Negative. Other: None IMPRESSION: 1. No acute intracranial abnormality is seen. 2. No acute facial fracture. 3. Extensive periodontal disease. 4. No acute fracture or malalignment of the cervical spine. 5. C3-C7 anterior cervical discectomy and fusion with instrumentation has been performed with bridging callus involving the fused segments. 6. 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 Electronically Signed   By: Dorethia Molt M.D.   On: 08/16/2023 19:27     Procedures   Medications Ordered in the ED  ibuprofen  (ADVIL ) tablet 600 mg (has no administration in time range)  cyclobenzaprine  (FLEXERIL ) tablet 5 mg (has no administration in time range)                                    Medical Decision Making Risk Prescription drug management.   Oral meds ordered No further workup needed The patient does not have a medical emergency, seen every night for chronic issues.      Final diagnoses:  Body aches    ED Discharge Orders     None          Cottie Donnice PARAS, MD 08/17/23 2209

## 2023-08-17 NOTE — ED Notes (Signed)
 Went to medicate pt and noticed about 5-6 bedbugs in the bed. Pt immediately got mad stating them not my bugs and she proceeded to the lobby and refused to be decontaminated.

## 2023-08-17 NOTE — ED Triage Notes (Signed)
 Body pain today
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
## 2023-08-17 NOTE — Discharge Instructions (Addendum)
 Take Tylenol  as needed for pain.  Your x-rays and CT scans today did not show any fractures.  Follow-up with your doctor.  Turn to the ED with new or worsening symptoms peer

## 2023-08-17 NOTE — ED Notes (Signed)
Pt ambulates without difficulty

## 2023-08-19 ENCOUNTER — Emergency Department (HOSPITAL_COMMUNITY)
Admission: EM | Admit: 2023-08-19 | Discharge: 2023-08-20 | Disposition: A | Discharge status: Home / Self Care | Attending: Emergency Medicine | Admitting: Emergency Medicine

## 2023-08-19 ENCOUNTER — Encounter (HOSPITAL_COMMUNITY): Payer: Self-pay

## 2023-08-19 ENCOUNTER — Other Ambulatory Visit: Payer: Self-pay

## 2023-08-19 DIAGNOSIS — H538 Other visual disturbances: Secondary | ICD-10-CM | POA: Diagnosis present

## 2023-08-19 DIAGNOSIS — R52 Pain, unspecified: Secondary | ICD-10-CM | POA: Diagnosis present

## 2023-08-19 DIAGNOSIS — M7918 Myalgia, other site: Secondary | ICD-10-CM | POA: Diagnosis not present

## 2023-08-19 DIAGNOSIS — Z760 Encounter for issue of repeat prescription: Secondary | ICD-10-CM | POA: Diagnosis present

## 2023-08-19 DIAGNOSIS — Z5321 Procedure and treatment not carried out due to patient leaving prior to being seen by health care provider: Secondary | ICD-10-CM | POA: Diagnosis not present

## 2023-08-19 IMAGING — CT CT HEAD W/O CM
3 of 4 series · 13 of 47 positions shown, 15 images · non-contrast
Comparison: 12/19/2020.

CLINICAL DATA: Headache, new or worsening.

EXAM:
CT HEAD WITHOUT CONTRAST
TECHNIQUE: Contiguous axial images were obtained from the base of the skull
through the vertex without intravenous contrast.

[Series 3: head without · axial · non-contrast · 0.47mm/px · z∈[+718,+844]mm · 7 of 35 slices shown, 9 images]
[im 5/35  brain]
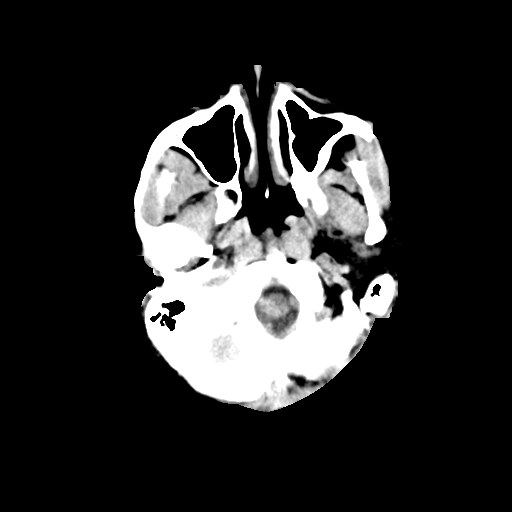
[im 5/35  bone]
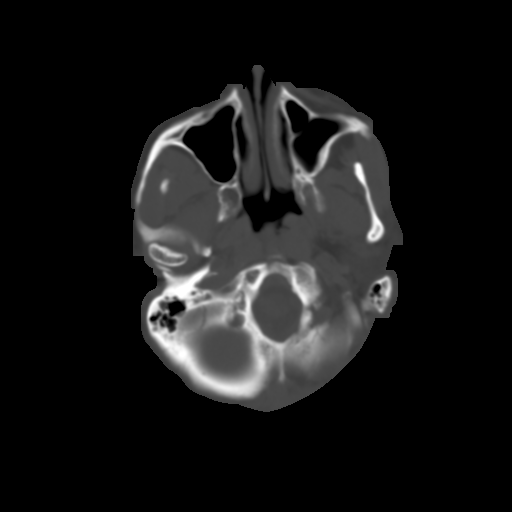
[im 9/35  brain]
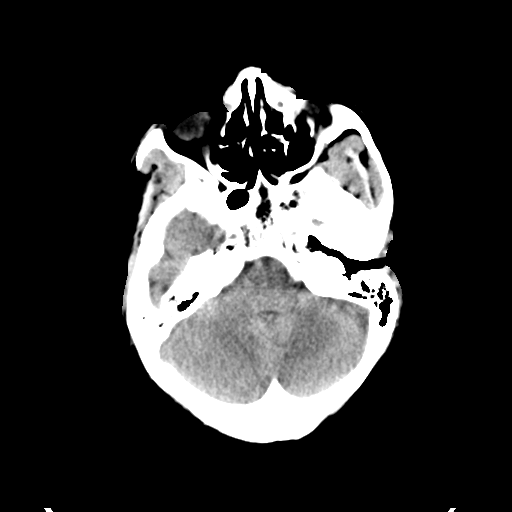
[im 13/35  brain]
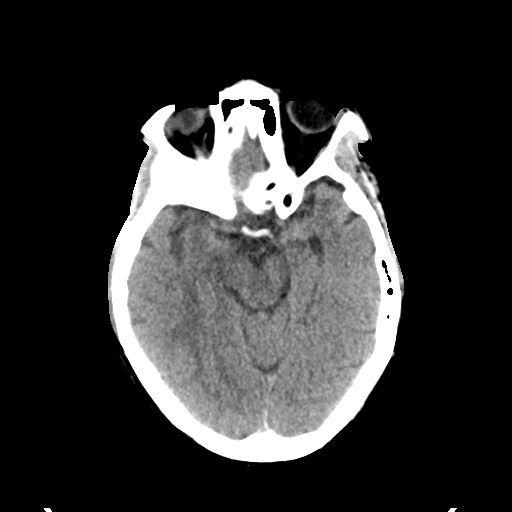
[im 18/35  brain]
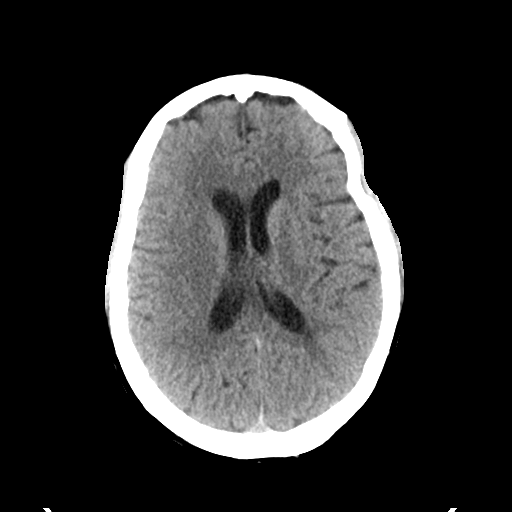
[im 22/35  brain]
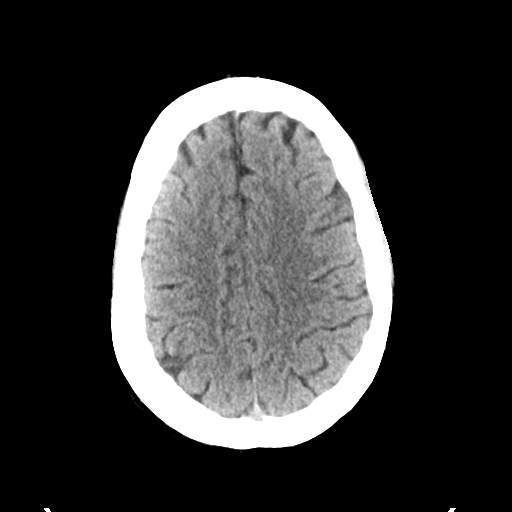
[im 22/35  bone]
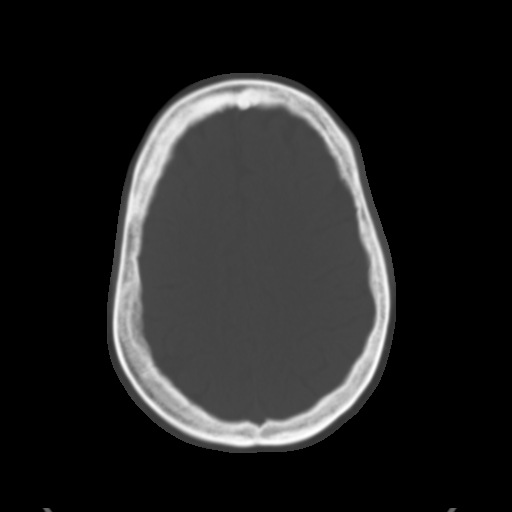
[im 26/35  brain]
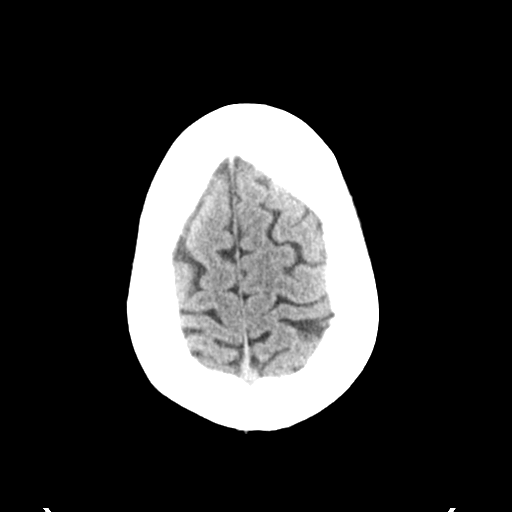
[im 30/35  brain]
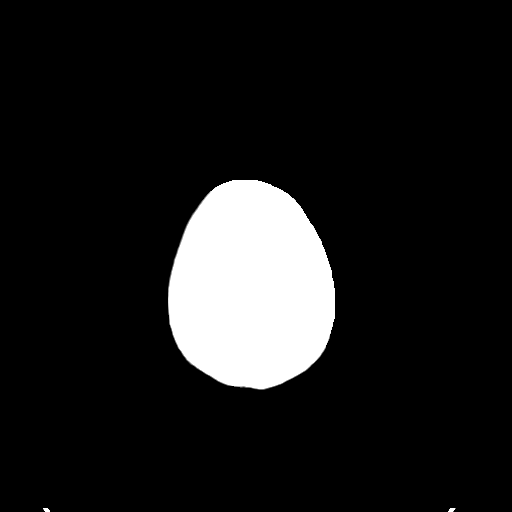

[Series 5: head without cor · coronal · non-contrast · 0.33mm/px · 3 of 72 slices shown]
[im 24/72  brain]
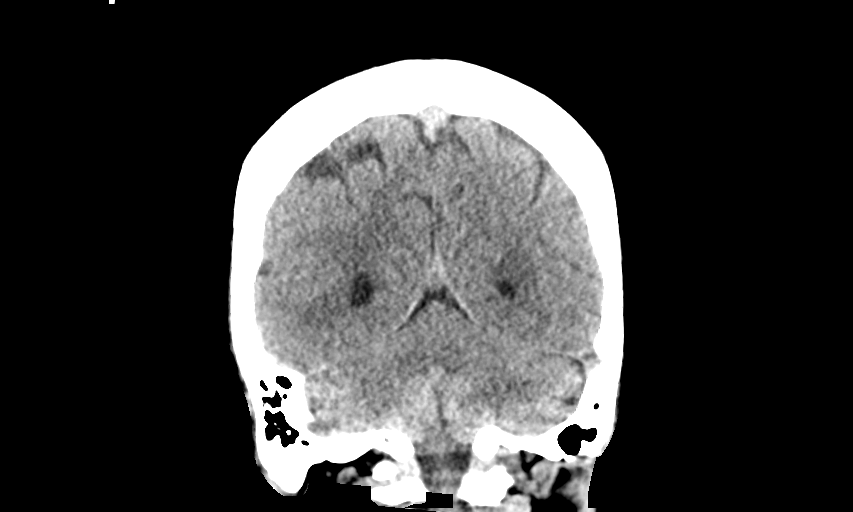
[im 32/72  brain]
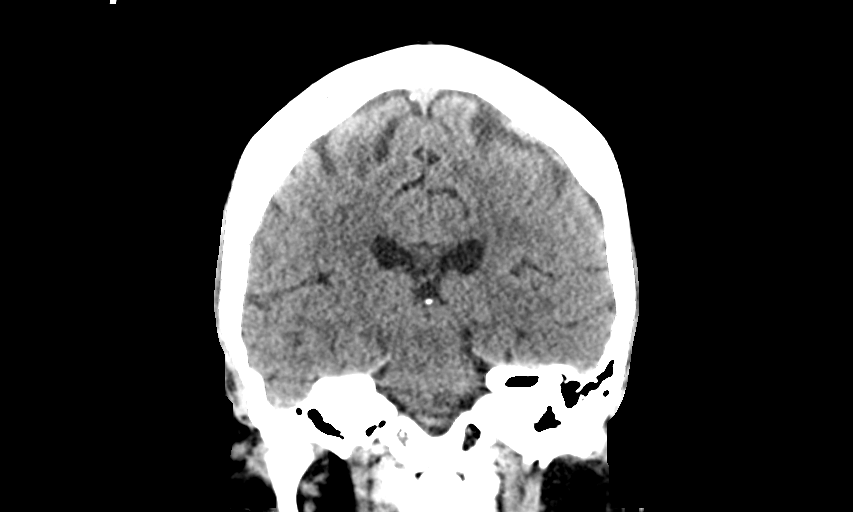
[im 40/72  brain]
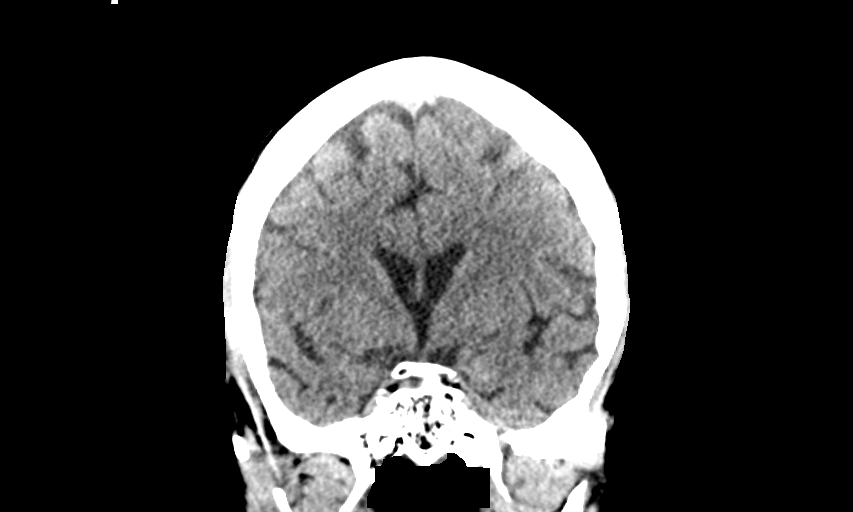

[Series 6: head without sag · sagittal · non-contrast · 0.33mm/px · 3 of 67 slices shown]
[im 28/67  brain]
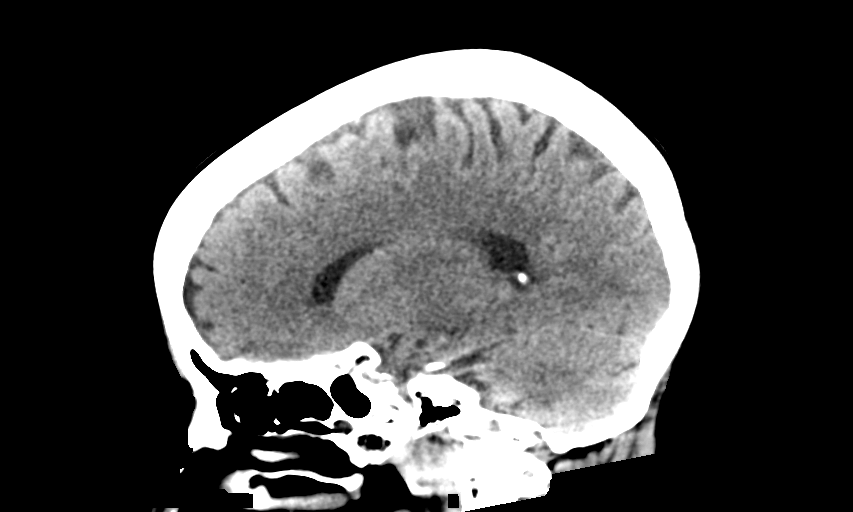
[im 34/67  brain]
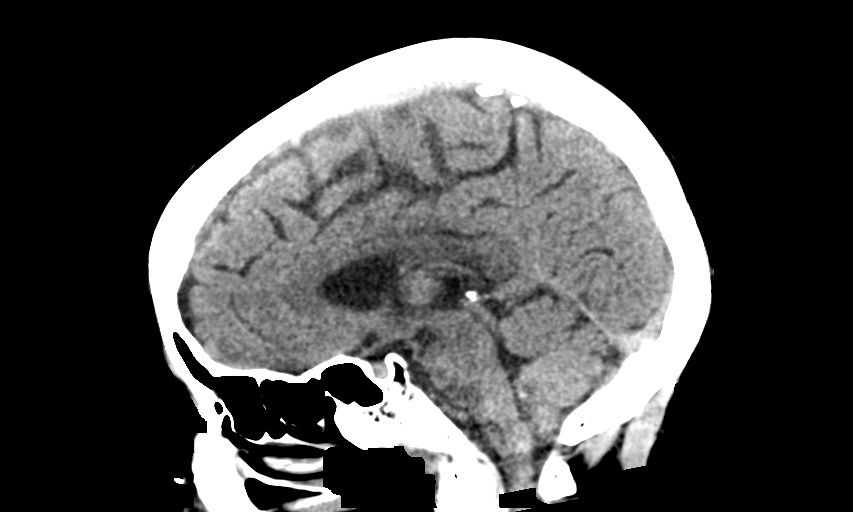
[im 40/67  brain]
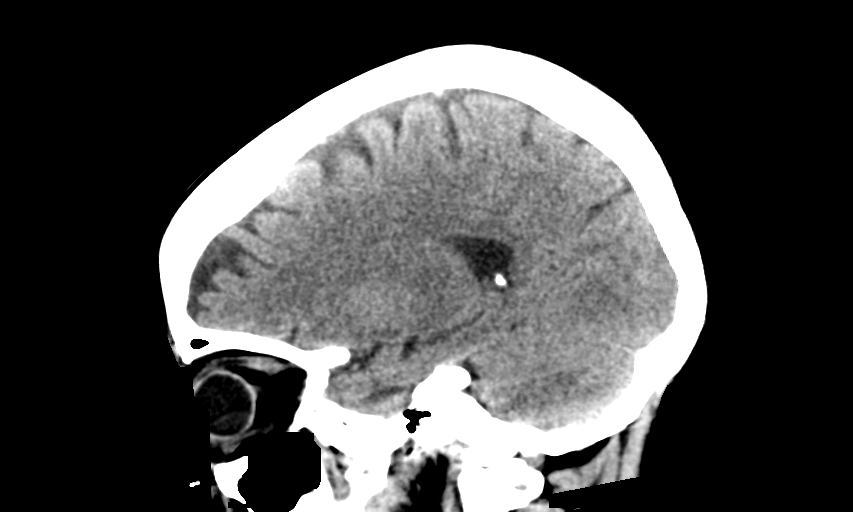

[13 of 47 positions shown; findings below may reference images not displayed]

FINDINGS: Brain: No acute intracranial hemorrhage, midline shift or mass
effect. No extra-axial fluid collection. Gray-white matter
differentiation is within normal limits and there is no
hydrocephalus. Mild periventricular white matter hypodensities are
noted bilaterally.

Vascular: No hyperdense vessel or unexpected calcification.

Skull: Normal. Negative for fracture or focal lesion.

Sinuses/Orbits: No acute finding.

Other: None.
IMPRESSION: No acute intracranial process.

## 2023-08-19 MED ORDER — ACETAMINOPHEN 325 MG PO TABS
650.0000 mg | ORAL_TABLET | Freq: Once | ORAL | Status: AC
  Administered 2023-08-19: 650 mg via ORAL
  Filled 2023-08-19: qty 2
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
## 2023-08-19 NOTE — ED Triage Notes (Signed)
 Pt arrives with c/o whole body pain that pt has been seen for multiple times. Pt ambulatory.
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
## 2023-08-20 ENCOUNTER — Other Ambulatory Visit: Payer: Self-pay

## 2023-08-20 ENCOUNTER — Encounter (HOSPITAL_COMMUNITY): Payer: Self-pay

## 2023-08-20 ENCOUNTER — Emergency Department (HOSPITAL_COMMUNITY)
Admission: EM | Admit: 2023-08-20 | Discharge: 2023-08-20 | Discharge status: Against Medical Advice | Attending: Emergency Medicine | Admitting: Emergency Medicine

## 2023-08-20 ENCOUNTER — Emergency Department (HOSPITAL_COMMUNITY)
Admission: EM | Admit: 2023-08-20 | Discharge: 2023-08-21 | Discharge status: Against Medical Advice | Source: Home / Self Care

## 2023-08-20 DIAGNOSIS — Z5321 Procedure and treatment not carried out due to patient leaving prior to being seen by health care provider: Secondary | ICD-10-CM | POA: Insufficient documentation

## 2023-08-20 DIAGNOSIS — M7918 Myalgia, other site: Secondary | ICD-10-CM | POA: Insufficient documentation

## 2023-08-20 DIAGNOSIS — Z760 Encounter for issue of repeat prescription: Secondary | ICD-10-CM | POA: Insufficient documentation

## 2023-08-20 DIAGNOSIS — H538 Other visual disturbances: Secondary | ICD-10-CM | POA: Insufficient documentation

## 2023-08-20 MED ORDER — NAPROXEN 375 MG PO TABS: 375.0000 mg | ORAL_TABLET | Freq: Two times a day (BID) | ORAL | 0 refills | Status: DC
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
## 2023-08-20 NOTE — ED Triage Notes (Signed)
 Pt forgot to tell the doctor she wants eye drops to help her see clearer, but she does not need glasses.

## 2023-08-20 NOTE — Discharge Instructions (Signed)
Return to the emergency department if you develop any life-threatening symptoms.

## 2023-08-20 NOTE — ED Notes (Signed)
 Patient left.

## 2023-08-21 ENCOUNTER — Encounter (HOSPITAL_COMMUNITY): Payer: Self-pay

## 2023-08-21 ENCOUNTER — Emergency Department (HOSPITAL_COMMUNITY)
Admission: EM | Admit: 2023-08-21 | Discharge: 2023-08-21 | Disposition: A | Discharge status: Home / Self Care | Attending: Emergency Medicine | Admitting: Emergency Medicine

## 2023-08-21 DIAGNOSIS — G8929 Other chronic pain: Secondary | ICD-10-CM | POA: Insufficient documentation

## 2023-08-21 DIAGNOSIS — M542 Cervicalgia: Secondary | ICD-10-CM | POA: Diagnosis present

## 2023-08-21 MED ORDER — DIPHENHYDRAMINE HCL 25 MG PO CAPS
25.0000 mg | ORAL_CAPSULE | Freq: Once | ORAL | Status: AC
  Administered 2023-08-21: 25 mg via ORAL
  Filled 2023-08-21: qty 1

## 2023-08-21 MED ORDER — CETIRIZINE HCL 5 MG/5ML PO SOLN
10.0000 mg | Freq: Once | ORAL | Status: DC
  Filled 2023-08-21: qty 10

## 2023-08-21 NOTE — ED Triage Notes (Signed)
 POV/ by self/ c/o pain to whole body/ worsening pain to legs/ tylenol  not helping/ A&OX4
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
## 2023-08-21 NOTE — Discharge Instructions (Addendum)
 You were prescribed naproxen  for your pain yesterday.  You may take 1 tablet every 12 hours as needed for pain.  Do not take this with other NSAID medications such as ibuprofen  (Motrin ), BC powders, Advil .  If you are experiencing itching, you may take 10 mg of cetirizine  (Zyrtec ) daily.  This is an over-the-counter medication.  You were given your first dose here today.  Please return to the emergency room for any other new or concerning symptoms.

## 2023-08-22 ENCOUNTER — Encounter (HOSPITAL_COMMUNITY): Payer: Self-pay

## 2023-08-22 ENCOUNTER — Other Ambulatory Visit: Payer: Self-pay

## 2023-08-22 ENCOUNTER — Emergency Department (HOSPITAL_COMMUNITY): Admission: EM | Admit: 2023-08-22 | Discharge: 2023-08-22 | Disposition: A | Discharge status: Home / Self Care

## 2023-08-22 DIAGNOSIS — H5789 Other specified disorders of eye and adnexa: Secondary | ICD-10-CM | POA: Diagnosis not present

## 2023-08-22 DIAGNOSIS — L299 Pruritus, unspecified: Secondary | ICD-10-CM | POA: Insufficient documentation

## 2023-08-22 DIAGNOSIS — G8921 Chronic pain due to trauma: Secondary | ICD-10-CM

## 2023-08-22 DIAGNOSIS — H04123 Dry eye syndrome of bilateral lacrimal glands: Secondary | ICD-10-CM

## 2023-08-22 DIAGNOSIS — G8929 Other chronic pain: Secondary | ICD-10-CM | POA: Diagnosis present

## 2023-08-22 MED ORDER — CAPSAICIN 0.025 % EX CREA
TOPICAL_CREAM | Freq: Two times a day (BID) | CUTANEOUS | Status: DC
  Filled 2023-08-22: qty 60

## 2023-08-22 MED ORDER — ACETAMINOPHEN 325 MG PO TABS: 650.0000 mg | ORAL_TABLET | Freq: Once | ORAL | Status: DC

## 2023-08-22 MED ORDER — NAPHAZOLINE-GLYCERIN 0.012-0.25 % OP SOLN
1.0000 [drp] | Freq: Four times a day (QID) | OPHTHALMIC | Status: DC | PRN
  Administered 2023-08-22: 1 [drp] via OPHTHALMIC
  Filled 2023-08-22: qty 15
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
## 2023-08-22 NOTE — ED Triage Notes (Signed)
 POV/ ambulatory/ c/o body pain/ wants something stronger for pain other than tylenol 

## 2023-08-22 NOTE — Discharge Instructions (Addendum)
 You were seen today for chronic pain, itching and dry eyes.  Provided you some eyedrops as well as capsaicin  cream for you to use over the painful joints.  Please take the Zyrtec  that you have been prescribed to previously for the itching that you talked about today.

## 2023-08-22 NOTE — Progress Notes (Signed)
 Patient is a high ED utilizer with 120 ED visits within 6 months. [name] with DSS called; awaiting a callback. Inpatient Care Management to develop a plan of care for next visit. [name] [facility] Inpatient Care Management [facility] [phone]

## 2023-08-25 ENCOUNTER — Other Ambulatory Visit: Payer: Self-pay

## 2023-08-25 ENCOUNTER — Emergency Department (HOSPITAL_COMMUNITY)
Admission: EM | Admit: 2023-08-25 | Discharge: 2023-08-25 | Disposition: A | Discharge status: Home / Self Care | Attending: Emergency Medicine | Admitting: Emergency Medicine

## 2023-08-25 ENCOUNTER — Emergency Department (HOSPITAL_COMMUNITY)
Admission: EM | Admit: 2023-08-25 | Discharge: 2023-08-26 | Disposition: A | Discharge status: Home / Self Care | Source: Home / Self Care | Attending: Emergency Medicine | Admitting: Emergency Medicine

## 2023-08-25 ENCOUNTER — Encounter (HOSPITAL_COMMUNITY): Payer: Self-pay | Admitting: Emergency Medicine

## 2023-08-25 ENCOUNTER — Encounter (HOSPITAL_COMMUNITY): Payer: Self-pay | Admitting: Pharmacy Technician

## 2023-08-25 DIAGNOSIS — R52 Pain, unspecified: Secondary | ICD-10-CM

## 2023-08-25 DIAGNOSIS — H538 Other visual disturbances: Secondary | ICD-10-CM | POA: Diagnosis not present

## 2023-08-25 DIAGNOSIS — Z8673 Personal history of transient ischemic attack (TIA), and cerebral infarction without residual deficits: Secondary | ICD-10-CM | POA: Diagnosis not present

## 2023-08-25 DIAGNOSIS — I160 Hypertensive urgency: Secondary | ICD-10-CM | POA: Diagnosis not present

## 2023-08-25 DIAGNOSIS — I10 Essential (primary) hypertension: Secondary | ICD-10-CM | POA: Insufficient documentation

## 2023-08-25 DIAGNOSIS — E119 Type 2 diabetes mellitus without complications: Secondary | ICD-10-CM | POA: Insufficient documentation

## 2023-08-25 DIAGNOSIS — Z7984 Long term (current) use of oral hypoglycemic drugs: Secondary | ICD-10-CM | POA: Insufficient documentation

## 2023-08-25 DIAGNOSIS — R451 Restlessness and agitation: Secondary | ICD-10-CM | POA: Diagnosis present

## 2023-08-25 DIAGNOSIS — Z79899 Other long term (current) drug therapy: Secondary | ICD-10-CM | POA: Diagnosis not present

## 2023-08-25 DIAGNOSIS — M791 Myalgia, unspecified site: Secondary | ICD-10-CM | POA: Insufficient documentation

## 2023-08-25 DIAGNOSIS — G8929 Other chronic pain: Secondary | ICD-10-CM

## 2023-08-25 MED ORDER — ACETAMINOPHEN 500 MG PO TABS
1000.0000 mg | ORAL_TABLET | Freq: Once | ORAL | Status: AC
  Administered 2023-08-25: 1000 mg via ORAL
  Filled 2023-08-25: qty 2

## 2023-08-25 MED ORDER — KETOROLAC TROMETHAMINE 15 MG/ML IJ SOLN
15.0000 mg | Freq: Once | INTRAMUSCULAR | Status: DC
  Filled 2023-08-25: qty 1

## 2023-08-25 NOTE — ED Triage Notes (Signed)
 PT dc from ED earlier today for same complaint. Pt states body hurts from [place] city [place]. Pt states she doesn't know what was given to her when she was here earlier however states that tylenol  does not work

## 2023-08-25 NOTE — ED Triage Notes (Signed)
 Pt here with complaints of body pain.

## 2023-08-25 NOTE — Discharge Instructions (Addendum)
 Thank you for coming to [facility] Emergency Department.  Please follow up with your primary care provider within 1 week.   [facility] [place] is a program that provides free eye exam still low income and uninsured individuals in [place] .  You can work with the social service agency or social worker/caseworker to help you submit an application.    Do not hesitate to return to the ED or call 911 if you experience: -Worsening symptoms -Lightheadedness, passing out -Fevers/chills -Anything else that concerns you

## 2023-08-25 NOTE — ED Notes (Signed)
 Patient Alert and oriented to baseline. Stable and ambulatory to baseline. Patient verbalized understanding of the discharge instructions.  Patient belongings were taken by the patient.
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
## 2023-08-26 ENCOUNTER — Emergency Department (HOSPITAL_COMMUNITY)

## 2023-08-26 ENCOUNTER — Emergency Department (HOSPITAL_COMMUNITY)
Admission: EM | Admit: 2023-08-26 | Discharge: 2023-08-27 | Disposition: A | Discharge status: Home / Self Care | Attending: Emergency Medicine | Admitting: Emergency Medicine

## 2023-08-26 DIAGNOSIS — G8929 Other chronic pain: Secondary | ICD-10-CM | POA: Insufficient documentation

## 2023-08-26 DIAGNOSIS — M791 Myalgia, unspecified site: Secondary | ICD-10-CM | POA: Diagnosis present

## 2023-08-26 LAB — CBC WITH DIFFERENTIAL/PLATELET
Abs Immature Granulocytes: 0.01 K/uL (ref 0.00–0.07)
Basophils Absolute: 0 K/uL (ref 0.0–0.1)
Basophils Relative: 1 %
Eosinophils Absolute: 0.2 K/uL (ref 0.0–0.5)
Eosinophils Relative: 2 %
HCT: 32 % — ABNORMAL LOW (ref 36.0–46.0)
Hemoglobin: 10 g/dL — ABNORMAL LOW (ref 12.0–15.0)
Immature Granulocytes: 0 %
Lymphocytes Relative: 19 %
Lymphs Abs: 1.2 K/uL (ref 0.7–4.0)
MCH: 29.1 pg (ref 26.0–34.0)
MCHC: 31.3 g/dL (ref 30.0–36.0)
MCV: 93 fL (ref 80.0–100.0)
Monocytes Absolute: 0.5 K/uL (ref 0.1–1.0)
Monocytes Relative: 7 %
Neutro Abs: 4.6 K/uL (ref 1.7–7.7)
Neutrophils Relative %: 71 %
Platelets: 219 K/uL (ref 150–400)
RBC: 3.44 MIL/uL — ABNORMAL LOW (ref 3.87–5.11)
RDW: 12.9 % (ref 11.5–15.5)
WBC: 6.5 K/uL (ref 4.0–10.5)
nRBC: 0 % (ref 0.0–0.2)

## 2023-08-26 LAB — COMPREHENSIVE METABOLIC PANEL WITH GFR
ALT: 10 U/L (ref 0–44)
AST: 17 U/L (ref 15–41)
Albumin: 3.5 g/dL (ref 3.5–5.0)
Alkaline Phosphatase: 68 U/L (ref 38–126)
Anion gap: 10 (ref 5–15)
BUN: 11 mg/dL (ref 8–23)
CO2: 24 mmol/L (ref 22–32)
Calcium: 8.6 mg/dL — ABNORMAL LOW (ref 8.9–10.3)
Chloride: 106 mmol/L (ref 98–111)
Creatinine, Ser: 1.08 mg/dL — ABNORMAL HIGH (ref 0.44–1.00)
GFR, Estimated: 53 mL/min — ABNORMAL LOW (ref >60)
Glucose, Bld: 100 mg/dL — ABNORMAL HIGH (ref 70–99)
Potassium: 3.3 mmol/L — ABNORMAL LOW (ref 3.5–5.1)
Sodium: 140 mmol/L (ref 135–145)
Total Bilirubin: 0.4 mg/dL (ref 0.0–1.2)
Total Protein: 7.5 g/dL (ref 6.5–8.1)

## 2023-08-26 LAB — TROPONIN I (HIGH SENSITIVITY)
Troponin I (High Sensitivity): 8 ng/L (ref <18)
Troponin I (High Sensitivity): 8 ng/L (ref <18)

## 2023-08-26 LAB — CBG MONITORING, ED: Glucose-Capillary: 111 mg/dL — ABNORMAL HIGH (ref 70–99)

## 2023-08-26 MED ORDER — CLONIDINE HCL 0.1 MG PO TABS: 0.1000 mg | ORAL_TABLET | Freq: Once | ORAL | Status: DC

## 2023-08-26 MED ORDER — IBUPROFEN 400 MG PO TABS
600.0000 mg | ORAL_TABLET | Freq: Once | ORAL | Status: AC
  Administered 2023-08-26: 600 mg via ORAL
  Filled 2023-08-26: qty 1

## 2023-08-26 MED ORDER — CLONIDINE HCL 0.1 MG PO TABS
0.1000 mg | ORAL_TABLET | Freq: Once | ORAL | Status: AC
  Administered 2023-08-26: 0.1 mg via ORAL
  Filled 2023-08-26: qty 1

## 2023-08-26 MED ORDER — AMLODIPINE BESYLATE 5 MG PO TABS
5.0000 mg | ORAL_TABLET | Freq: Once | ORAL | Status: AC
  Administered 2023-08-26: 5 mg via ORAL
  Filled 2023-08-26: qty 1

## 2023-08-26 MED ORDER — CLONIDINE HCL 0.2 MG PO TABS: 0.2000 mg | ORAL_TABLET | Freq: Two times a day (BID) | ORAL | 0 refills | Status: DC

## 2023-08-26 NOTE — ED Notes (Addendum)
 Went to take pt to be decontaminated due to previous visualization of bed bugs on patient at previous visit. Pt refusing to change into gown or be decontaminated. Explained to patient necessity for safety for herself and everyone else, pt continues to refuse. Charge aware.

## 2023-08-26 NOTE — ED Triage Notes (Signed)
 Patient here for same thing she is here for every day, reports body aches, 10/10, tylenol  does not help.

## 2023-08-26 NOTE — Progress Notes (Signed)
 Transition of Care [facility]) - Emergency Department Mini Assessment   Patient Details  Name: [name] MRN: [id number] Date of Birth: Jun 03, 1945  Transition of Care [facility]) CM/SW Contact:    Debarah Saunas, RN Phone Number: 08/26/2023, 8:36 AM   Clinical Narrative: [phone]:  RNCM consulted regarding pt refusing care and being more confused.  PM Bedside RN states pt not being able to articulate reason for visit and being more agitated than in past visits.  Bedside RN states pt refusing to undress for decontamination of bedbugs and refusing lab draws.  RNCM visited pt at bedside to help determine reason for visit and refusal of care.  Pt states she came to get blood pills!  When asked about blood pills, RNCM clarified asking probing questions to determine that pt assumed that we wanted to give her blood instead of draw blood.  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  RNCM relayed message to AM Bedside RN.  0740:  RNCM asked to return to room as pt refused blood draw again.  Pt adamant that she does not need blood.  RNCM explained again that we need to draw her blood, not give her blood.  Pt asking why we needed to draw blood if she came to get a flu shot.  RNCM explained that it is not quite time for a flu shot.  Pt states she will visit her [facility] for flu shot in the future.  RNCM redirected to blood draw; pt once agreed to butterfly needle only.  RNCM discussed with lab technician to offer butterfly needle if possible. RNCM returned to pt room and was present when lab technician drew blood utilizing butterfly needle and vein pt requested.   Pt requested food (brown biscuit, potatoes and cooked apples) as she has been in ER all night.  RNCM notified staff of request.  0800;  RNCM returned with brown biscuit and potatoes.  Pt did not recognize any food on the plate, but said she would try it.  Pt requested applesauce and mountain dew. RNCM advised that we do  not have mountain dew.  Pt states apple juice will be fine.  0802:  RNCM returned with requested items.  Pt did not remember asking for items but I'll eat it because I'm hungry.  ICM will continue to follow.     ED Mini Assessment: What brought you to the Emergency Department? : (P) I came to get blood pills, you know something to build your blood up  Barriers to Discharge: (P) Continued Medical Work up  [employer] interventions: (P) clarified request to determine iron pill  Means of departure: (P) Public Transportation  Interventions which prevented an admission or readmission: (P) Medication Review    Patient Contact and Communications        ,          Patient states their goals for this hospitalization and ongoing recovery are:: (P) Get my medicine and go home      Admission diagnosis:  Medication Request Patient Active Problem List   Diagnosis Date Noted   Moderate cognitive impairment 06/16/2021   Chronic headache 06/16/2021   Healthcare maintenance 06/16/2021   Frequent patient in emergency department 06/16/2021   Cervical stenosis of spine 11/10/2019   Type 2 diabetes mellitus with hyperlipidemia (HCC) 06/20/2012   History of stroke 06/20/2012   Essential hypertension 09/04/2006   Osteoarthritis 09/04/2006   PCP:  Pcp, No Pharmacy:   [id number]  [place], [place] - [address] [place] [address] Phone: [phone] Fax: [phone]  [facility] [address] [place] [address] Phone: [phone] Fax: [phone]  [name] [address] [place] [address] Phone: [phone] Fax: [phone]

## 2023-08-26 NOTE — ED Notes (Signed)
 Patient B/P was retaken in both arms in waiting area and was high it was reported to triage Nurse Medford.

## 2023-08-26 NOTE — ED Provider Notes (Addendum)
 Physical Exam  BP (!) 223/69 (BP Location: Right Arm)   Pulse (!) 58   Temp 98.2 F (36.8 C)   Resp 16   SpO2 100%   Physical Exam Vitals and nursing note reviewed.  Constitutional:      General: She is not in acute distress.    Appearance: Normal appearance. She is well-developed. She is not ill-appearing or diaphoretic.  HENT:     Head: Normocephalic and atraumatic.  Eyes:     General: No scleral icterus.       Right eye: No discharge.        Left eye: No discharge.     Extraocular Movements: Extraocular movements intact.     Conjunctiva/sclera: Conjunctivae normal.  Cardiovascular:     Rate and Rhythm: Normal rate and regular rhythm.     Pulses: Normal pulses.     Heart sounds: Normal heart sounds. No murmur heard.    No friction rub. No gallop.  Pulmonary:     Effort: Pulmonary effort is normal. No respiratory distress.     Breath sounds: Normal breath sounds. No stridor. No wheezing, rhonchi or rales.  Chest:     Chest wall: No tenderness.  Abdominal:     General: Abdomen is flat. There is no distension.     Palpations: Abdomen is soft.     Tenderness: There is no abdominal tenderness. There is no right CVA tenderness, left CVA tenderness, guarding or rebound.  Musculoskeletal:        General: No swelling, deformity or signs of injury.     Cervical back: Normal range of motion and neck supple. No rigidity.     Right lower leg: No edema.     Left lower leg: No edema.  Skin:    General: Skin is warm and dry.     Capillary Refill: Capillary refill takes less than 2 seconds.     Findings: No bruising, erythema or lesion.  Neurological:     General: No focal deficit present.     Mental Status: She is alert. Mental status is at baseline.     Cranial Nerves: No cranial nerve deficit.     Sensory: No sensory deficit.     Motor: No weakness.     Coordination: Coordination normal.     Comments: Patient at baseline mentation, able to be alert and oriented to person,  place, event.  With known inability to intermittently not be able to provide time.  Neuroexam is otherwise unremarkable.  Psychiatric:        Mood and Affect: Mood normal.     Procedures  Procedures  ED Course / MDM   Clinical Course as of 08/26/23 0700  Mon Aug 26, 2023  9363 HTN emergency workup. Has not been at mentation baseline. Provided amlodipine .  [CB]    Clinical Course User Index [CB] Beola Terrall RAMAN, PA-C   Medical Decision Making Amount and/or Complexity of Data Reviewed Labs: ordered. Radiology: ordered.  Risk Prescription drug management.   Patient care transferred over from [facility].  At time of handoff, awaiting hypertensive emergency due to work up, stating that the patient had been talking herself more frequently and not at baseline with BP of over 200 systolic.  Nonfocal neuroexam.  5 mg amlodipine  was provided.  Provided clonidine  for patient due to patient having elevated BP.  She refused further imaging at this time.  Do not believe that a TTS consult would be beneficial at this time due to  her having already been seen in May and shown to only have moderate cognitive deficits.  With her refusing at this time and being alert and oriented x 3 to person, place, event, believe that she is stated discharge this time.  Blood pressure lowered to 180.  Will provide outpatient clonidine  for her to use as needed for BP above 180/120.  Again recommended that she follow-up with primary care however likely not going to follow through with instructions to patient's history.  Was in communication with hospital team throughout the course of this case to figure out best course of action for this patient.  Case was discussed with attending who agreed with plan .   Patient to meet with DSS and held until conversation was held with her.   Patient vital signs have remained stable throughout the course of patient's time in the ED. Low suspicion for any other  emergent pathology at this time. I believe this patient is safe to be discharged. Provided strict return to ER precautions. Patient expressed agreement and understanding of plan. All questions were answered.     Beola Terrall RAMAN, PA-C 08/26/23 1156    Shalom Ware S, PA-C 08/26/23 1611    Patsey Lot, MD 09/02/23 1453

## 2023-08-26 NOTE — Discharge Instructions (Addendum)
 You were seen today for hypertensive urgency due to your blood pressure being elevated despite not having any other symptoms at this time besides the chronic pain.  Recommend you continue to take Tylenol  as needed as well as the cream given to you for additional pain relief.  Please return to the ED sooner if you have any new or worsening symptoms which would include chest pain, shortness of breath, one-sided weakness.   I have prescribed clonidine  for you today.  Take if your blood pressure is 180/120, otherwise take your blood pressure medication as prescribed otherwise those are prescribed to you which would include your Norvasc  and your chlorthalidone .

## 2023-08-26 NOTE — ED Provider Notes (Signed)
 [place] EMERGENCY DEPARTMENT AT [facility] Provider Note   CSN: [id number] Arrival date & time: 08/25/23  2014     Patient presents with: Medication Refill   [name] is a 78 y.o. female patient is well-known to this emergency department with often multiple visits per day to the ED.  History of hypertension, type 2 diabetes, challenging social situation.  This evening she presents and she is agitated and somewhat disoriented from her baseline which is well-known to the ED staff in this department.  She cannot name the year or the date, has difficulty telling why she is in the emergency department today which is different for her.  Additionally notably hypertensive throughout her stay in the emergency department which is new.  Patient states that she took her blood pressure medications today and that she takes them every single day, however patient noted to be poorly compliant in the past.  Level 5 caveat  due to difficulty with patient's history.    HPI     Prior to Admission medications   Medication Sig Start Date End Date Taking? Authorizing Provider  acetaminophen  (TYLENOL ) 500 MG tablet Take 1 tablet (500 mg total) by mouth every 6 (six) hours as needed. 08/02/23   Elnor Jayson LABOR, DO  albuterol  (VENTOLIN  HFA) 108 (90 Base) MCG/ACT inhaler Inhale 1-2 puffs into the lungs every 6 (six) hours as needed for wheezing or shortness of breath. 04/22/20   Myrna Camelia HERO, NP  amLODipine  (NORVASC ) 10 MG tablet Take 1 tablet (10 mg total) by mouth daily. 01/23/23   Roemhildt, Lorin T, PA-C  chlorthalidone  (HYGROTON ) 25 MG tablet TAKE 1 TABLET (25 MG TOTAL) BY MOUTH DAILY. 11/20/21 11/20/22  Paseda, Folashade R, FNP  diclofenac  Sodium (VOLTAREN ) 1 % GEL Apply 4 g topically 4 (four) times daily. 08/15/23   [name], PA-C  diphenhydrAMINE  (BENADRYL ) 25 MG tablet Take 1 tablet (25 mg total) by mouth every 6 (six) hours as needed for itching or allergies. 10/03/22   [name], PA-C   gabapentin  (NEURONTIN ) 100 MG capsule Take 1 capsule (100 mg total) by mouth 3 (three) times daily as needed. 04/30/23   Bero, Michael M, MD  hydrocortisone  cream 1 % Apply to affected area 2 times daily 10/22/22   Jarold Olam HERO, PA-C  hydrOXYzine  (ATARAX ) 25 MG tablet Take 1 tablet (25 mg total) by mouth every 8 (eight) hours as needed. 11/23/22   Henderly, Britni A, PA-C  lidocaine  (LIDODERM ) 5 % Place 1 patch onto the skin daily. Remove & Discard patch within 12 hours or as directed by MD 08/02/23   Elnor Jayson LABOR, DO  loratadine  (CLARITIN ) 10 MG tablet Take 1 tablet (10 mg total) by mouth daily. 04/06/23   Barrett, Warren SAILOR, PA-C  metFORMIN  (GLUCOPHAGE ) 500 MG tablet Take 1 tablet (500 mg total) by mouth 2 (two) times daily with a meal. 11/03/22 11/03/23  Lang Norleen POUR, PA-C  methocarbamol  (ROBAXIN ) 500 MG tablet Take 1 tablet (500 mg total) by mouth 2 (two) times daily. 03/19/23   Ula Prentice SAUNDERS, MD  metoprolol  succinate (TOPROL -XL) 25 MG 24 hr tablet Take 1 tablet (25 mg total) by mouth daily. 06/18/23   Melvenia Motto, MD  naproxen  (NAPROSYN ) 375 MG tablet Take 1 tablet (375 mg total) by mouth 2 (two) times daily. 08/20/23   Logan Ubaldo NOVAK, PA-C  pantoprazole  (PROTONIX ) 20 MG tablet Take 1 tablet (20 mg total) by mouth daily. 10/10/21   Dean Clarity, MD  triamcinolone   cream (KENALOG ) 0.1 % Apply 1 Application topically 2 (two) times daily. 07/07/23   Vicky Charleston, PA-C    Allergies: Codeine, Ibuprofen , and Imdur [isosorbide nitrate]    Review of Systems  Neurological:  Positive for headaches.  Psychiatric/Behavioral:  Positive for confusion.     Updated Vital Signs BP (!) 223/69 (BP Location: Right Arm)   Pulse (!) 58   Temp 98.2 F (36.8 C)   Resp 16   SpO2 100%   Physical Exam Vitals and nursing note reviewed.  Constitutional:      Appearance: She is not ill-appearing or toxic-appearing.  HENT:     Head: Normocephalic and atraumatic.     Mouth/Throat:     Mouth: Mucous  membranes are moist.     Pharynx: No oropharyngeal exudate or posterior oropharyngeal erythema.  Eyes:     General:        Right eye: No discharge.        Left eye: No discharge.     Conjunctiva/sclera: Conjunctivae normal.  Cardiovascular:     Rate and Rhythm: Normal rate and regular rhythm.     Pulses: Normal pulses.     Heart sounds: Normal heart sounds. No murmur heard. Pulmonary:     Effort: Pulmonary effort is normal. No respiratory distress.     Breath sounds: Normal breath sounds. No wheezing or rales.  Abdominal:     General: Bowel sounds are normal. There is no distension.     Palpations: Abdomen is soft.     Tenderness: There is no abdominal tenderness. There is no guarding or rebound.  Musculoskeletal:        General: No deformity.     Cervical back: Neck supple.  Skin:    General: Skin is warm and dry.     Capillary Refill: Capillary refill takes less than 2 seconds.  Neurological:     Mental Status: She is alert. She is disoriented.     GCS: GCS eye subscore is 4. GCS verbal subscore is 4. GCS motor subscore is 6.     Cranial Nerves: Cranial nerves 2-12 are intact.     Sensory: Sensation is intact.     Motor: Motor function is intact.     Coordination: Coordination is intact.     Gait: Gait is intact.  Psychiatric:        Mood and Affect: Mood normal.     (all labs ordered are listed, but only abnormal results are displayed) Labs Reviewed  CBG MONITORING, ED - Abnormal; Notable for the following components:      Result Value   Glucose-Capillary 111 (*)    All other components within normal limits  CBC WITH DIFFERENTIAL/PLATELET  COMPREHENSIVE METABOLIC PANEL WITH GFR  TROPONIN I (HIGH SENSITIVITY)    EKG: None  Radiology: No results found.   Procedures   Medications Ordered in the ED  ibuprofen  (ADVIL ) tablet 600 mg (600 mg Oral Given 08/26/23 0343)  amLODipine  (NORVASC ) tablet 5 mg (5 mg Oral Given 08/26/23 0438)    Clinical Course as of  08/26/23 0647  Mon Aug 26, 2023  0636 HTN emergency workup. Has not been at mentation baseline. Provided amlodipine .  [CB]    Clinical Course User Index [CB] Beola Terrall RAMAN PA-C                                 Medical Decision Making 78 year old female well-known to this  department.  Exquisitely hypertensive with SBP in the 230s.  Patient somewhat disoriented but neurologic exam is nonfocal.  Cardiopulmonary and abdominal exams are benign though exam is limited by patient's refusal for thorough exam by this provider.  Amount and/or Complexity of Data Reviewed Labs: ordered. Radiology: ordered.  Risk Prescription drug management.   Plan is to proceed with workup for hypertensive emergency, however patient quite resistant to workup in the ED at this time. Care of this patient signed out to oncoming ED provider C. Bauer, PA-C at time of shift change. All pertinent HPI, physical exam, and laboratory findings were discussed with them prior to my departure. Disposition of patient pending completion of workup, reevaluation, and clinical judgement of oncoming ED provider.   This chart was dictated using voice recognition software, Dragon. Despite the best efforts of this provider to proofread and correct errors, errors may still occur which can change documentation meaning.     Final diagnoses:  None    ED Discharge Orders     None          Bobette Pleasant JONELLE DEVONNA 08/26/23 9352    [name], MD 08/26/23 [phone]

## 2023-08-26 NOTE — ED Notes (Signed)
Pt refusing to be stuck for labs

## 2023-08-26 NOTE — Progress Notes (Addendum)
 1:55pm: CSW spoke with Jon at DSS who states she did visit with patient and that there are no APS barriers to discharge. Jon states she will complete a home visit at patient's residence.  11:50am: CSW spoke with Powell Irving of [facility] who states the report was accepted and assigned to Jon Marina [phone]).  CSW spoke with Jon to discuss patient. Jon states she will come and see patient and will be at the hospital within an hour as she is in [facility].  CSW notified medical team of request to hold patient until APS can visit.  8:25am: CSW spoke with [place] at [facility] to determine if APS report was accepted. CSW provided [place] with CSW contact information for assigned SW to return call.  Niels Portugal, MSW, LCSW Transitions of Care  Clinical Social Worker II [phone]

## 2023-08-26 NOTE — ED Notes (Signed)
 Pt refused labs rn crystal b aware at

## 2023-08-27 ENCOUNTER — Emergency Department (HOSPITAL_COMMUNITY)
Admission: EM | Admit: 2023-08-27 | Discharge: 2023-08-28 | Disposition: A | Discharge status: Home / Self Care | Source: Home / Self Care

## 2023-08-27 ENCOUNTER — Other Ambulatory Visit: Payer: Self-pay

## 2023-08-27 DIAGNOSIS — G8929 Other chronic pain: Secondary | ICD-10-CM | POA: Insufficient documentation

## 2023-08-27 DIAGNOSIS — M791 Myalgia, unspecified site: Secondary | ICD-10-CM | POA: Insufficient documentation

## 2023-08-27 MED ORDER — IBUPROFEN 400 MG PO TABS
600.0000 mg | ORAL_TABLET | Freq: Once | ORAL | Status: DC
  Filled 2023-08-27: qty 1

## 2023-08-27 NOTE — ED Provider Triage Note (Signed)
 Emergency Medicine Provider Triage Evaluation Note  [name] , a 78 y.o. female  was evaluated in triage.  Pt complains of body ache.  Review of Systems  Positive: Body ache Negative: Fever  Physical Exam  BP (!) 158/64 (BP Location: Right Arm)   Pulse 66   Temp 98.2 F (36.8 C)   Resp 16   SpO2 100%  Gen:   Awake, no distress   Resp:  Normal effort  MSK:   Moves extremities without difficulty  Other:    Medical Decision Making  Medically screening exam initiated at 9:00 PM.  Appropriate orders placed.  [name] was informed that the remainder of the evaluation will be completed by another provider, this initial triage assessment does not replace that evaluation, and the importance of remaining in the ED until their evaluation is complete.     [name] SAILOR, PA-C 08/27/23 2100

## 2023-08-27 NOTE — ED Triage Notes (Signed)
 Patient reports chronic body aches for several years . Ambulatory /respirations unlabored.

## 2023-08-27 NOTE — ED Provider Notes (Signed)
 [place] EMERGENCY DEPARTMENT AT [facility] Provider Note   CSN: [id number] Arrival date & time: 08/26/23  2044    Patient presents with: Generalized Body Aches   [name] is a 78 y.o. female here for evaluation of diffuse myalgias. Hx of similar well known to ED as well as myself. When asked what brought her here today she states  you know the same things as yesterday. She is also requesting someone to itch her back as she occasionally feels things crawling on her.   HPI     Prior to Admission medications   Medication Sig Start Date End Date Taking? Authorizing Provider  acetaminophen  (TYLENOL ) 500 MG tablet Take 1 tablet (500 mg total) by mouth every 6 (six) hours as needed. 08/02/23   Elnor Jayson LABOR, DO  albuterol  (VENTOLIN  HFA) 108 (90 Base) MCG/ACT inhaler Inhale 1-2 puffs into the lungs every 6 (six) hours as needed for wheezing or shortness of breath. 04/22/20   Myrna Camelia HERO, NP  amLODipine  (NORVASC ) 10 MG tablet Take 1 tablet (10 mg total) by mouth daily. 01/23/23   Roemhildt, Lorin T, PA-C  chlorthalidone  (HYGROTON ) 25 MG tablet TAKE 1 TABLET (25 MG TOTAL) BY MOUTH DAILY. 11/20/21 11/20/22  Paseda, Folashade R, FNP  cloNIDine  (CATAPRES ) 0.2 MG tablet Take 1 tablet (0.2 mg total) by mouth 2 (two) times daily. 08/26/23   Bauer, Collin S, PA-C  diclofenac  Sodium (VOLTAREN ) 1 % GEL Apply 4 g topically 4 (four) times daily. 08/15/23   [name], PA-C  diphenhydrAMINE  (BENADRYL ) 25 MG tablet Take 1 tablet (25 mg total) by mouth every 6 (six) hours as needed for itching or allergies. 10/03/22   [name], PA-C  gabapentin  (NEURONTIN ) 100 MG capsule Take 1 capsule (100 mg total) by mouth 3 (three) times daily as needed. 04/30/23   Bero, Michael M, MD  hydrocortisone  cream 1 % Apply to affected area 2 times daily 10/22/22   Jarold Olam HERO, PA-C  hydrOXYzine  (ATARAX ) 25 MG tablet Take 1 tablet (25 mg total) by mouth every 8 (eight) hours as needed. 11/23/22   Eleanore Junio  A, PA-C  lidocaine  (LIDODERM ) 5 % Place 1 patch onto the skin daily. Remove & Discard patch within 12 hours or as directed by MD 08/02/23   Elnor Jayson A, DO  loratadine  (CLARITIN ) 10 MG tablet Take 1 tablet (10 mg total) by mouth daily. 04/06/23   Barrett, Warren SAILOR, PA-C  metFORMIN  (GLUCOPHAGE ) 500 MG tablet Take 1 tablet (500 mg total) by mouth 2 (two) times daily with a meal. 11/03/22 11/03/23  Lang Norleen POUR, PA-C  methocarbamol  (ROBAXIN ) 500 MG tablet Take 1 tablet (500 mg total) by mouth 2 (two) times daily. 03/19/23   Ula Prentice SAUNDERS, MD  metoprolol  succinate (TOPROL -XL) 25 MG 24 hr tablet Take 1 tablet (25 mg total) by mouth daily. 06/18/23   Melvenia Motto, MD  naproxen  (NAPROSYN ) 375 MG tablet Take 1 tablet (375 mg total) by mouth 2 (two) times daily. 08/20/23   Logan Ubaldo NOVAK, PA-C  pantoprazole  (PROTONIX ) 20 MG tablet Take 1 tablet (20 mg total) by mouth daily. 10/10/21   Dean Clarity, MD  triamcinolone  cream (KENALOG ) 0.1 % Apply 1 Application topically 2 (two) times daily. 07/07/23   Vicky Charleston, PA-C    Allergies: Codeine, Ibuprofen , and Imdur [isosorbide nitrate]    Review of Systems  Musculoskeletal:  Positive for myalgias.  All other systems reviewed and are negative.   Updated Vital Signs BP ROLLEN)  151/56 (BP Location: Left Arm)   Pulse (!) 55   Temp 97.9 F (36.6 C) (Oral)   Resp 20   SpO2 97%   Physical Exam Vitals and nursing note reviewed.  Constitutional:      General: She is not in acute distress.    Appearance: She is well-developed. She is not ill-appearing.  HENT:     Head: Atraumatic.  Eyes:     Pupils: Pupils are equal, round, and reactive to light.  Neck:     Comments: Soft collar on neck Cardiovascular:     Rate and Rhythm: Normal rate.  Pulmonary:     Effort: No respiratory distress.  Abdominal:     General: There is no distension.  Musculoskeletal:        General: No swelling, tenderness, deformity or signs of injury. Normal range of motion.      Cervical back: Normal range of motion.     Right lower leg: No edema.     Left lower leg: No edema.     Comments: Moves all her extremities without difficulty  Skin:    General: Skin is warm and dry.  Neurological:     General: No focal deficit present.     Mental Status: She is alert.     Gait: Gait is intact.  Psychiatric:        Mood and Affect: Mood normal.    (all labs ordered are listed, but only abnormal results are displayed) Labs Reviewed - No data to display  EKG: None  Radiology: No results found.   Procedures   Medications Ordered in the ED  ibuprofen  (ADVIL ) tablet 600 mg (has no administration in time range)   78 year old well-known to the emergency department as well as myself who comes in almost daily here for evaluation of requesting ibuprofen  for her chronic pain.  128 visits in the last 6 months.  She denies any new complaints.  She also would like someone to wash her back as she states she occasionally feels things crawling on her.  She has known bedbugs.  She had recent labs done 08/26/2023 due to elevated blood pressure.  She states she is compliant with her blood pressure medication-- improved BP today compared to yesterday's visit.  She was given her requested ibuprofen .  Will have her follow-up outpatient, return for any worsening symptoms  The patient has been appropriately medically screened and/or stabilized in the ED. I have low suspicion for any other emergent medical condition which would require further screening, evaluation or treatment in the ED or require inpatient management.  Patient is hemodynamically stable and in no acute distress.  Patient able to ambulate in department prior to ED.  Evaluation does not show acute pathology that would require ongoing or additional emergent interventions while in the emergency department or further inpatient treatment.  I have discussed the diagnosis with the patient and answered all questions.  Pain is  been managed while in the emergency department and patient has no further complaints prior to discharge.  Patient is comfortable with plan discussed in room and is stable for discharge at this time.  I have discussed strict return precautions for returning to the emergency department.  Patient was encouraged to follow-up with PCP/specialist refer to at discharge.                                    Medical Decision Making  Amount and/or Complexity of Data Reviewed External Data Reviewed: labs, radiology and notes.  Risk Diagnosis or treatment significantly limited by social determinants of health.       Final diagnoses:  Other chronic pain    ED Discharge Orders     None          Ophie Burrowes A, PA-C 08/27/23 9388    Raford Lenis, MD 08/27/23 [phone]

## 2023-08-28 ENCOUNTER — Emergency Department (HOSPITAL_COMMUNITY)
Admission: EM | Admit: 2023-08-28 | Discharge: 2023-08-28 | Disposition: A | Discharge status: Home / Self Care | Attending: Emergency Medicine | Admitting: Emergency Medicine

## 2023-08-28 ENCOUNTER — Other Ambulatory Visit: Payer: Self-pay

## 2023-08-28 DIAGNOSIS — R52 Pain, unspecified: Secondary | ICD-10-CM | POA: Insufficient documentation

## 2023-08-28 DIAGNOSIS — T730XXA Starvation, initial encounter: Secondary | ICD-10-CM | POA: Insufficient documentation

## 2023-08-28 MED ORDER — ACETAMINOPHEN 325 MG PO TABS
650.0000 mg | ORAL_TABLET | Freq: Once | ORAL | Status: AC
  Administered 2023-08-28: 650 mg via ORAL
  Filled 2023-08-28: qty 2

## 2023-08-28 MED ORDER — IBUPROFEN 400 MG PO TABS
400.0000 mg | ORAL_TABLET | Freq: Once | ORAL | Status: AC
  Administered 2023-08-28: 400 mg via ORAL
  Filled 2023-08-28: qty 1
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
## 2023-08-28 NOTE — Discharge Instructions (Signed)
Follow up with your family doc in the office.  

## 2023-08-28 NOTE — ED Triage Notes (Signed)
 Patient states persistent generalized body aches . No injury or fall , ambulatory /respirations unlabored, seen here this morning for the same complaint.

## 2023-08-28 NOTE — Discharge Instructions (Signed)
 Please follow-up with your primary care provider for further evaluation as needed.  Return to the emergency department if you develop any life-threatening symptoms.
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
## 2023-08-28 NOTE — ED Notes (Signed)
 Pt asked NT for money for food. NT stated she did not have any on her and to ask for a sandwich when she gets to the back. Pt stated, No I've never liked [facility] food. And then Pt walked away.

## 2023-08-30 ENCOUNTER — Emergency Department (HOSPITAL_COMMUNITY)
Admission: EM | Admit: 2023-08-30 | Discharge: 2023-08-31 | Disposition: A | Discharge status: Home / Self Care | Attending: Emergency Medicine | Admitting: Emergency Medicine

## 2023-08-30 ENCOUNTER — Encounter (HOSPITAL_COMMUNITY): Payer: Self-pay | Admitting: *Deleted

## 2023-08-30 ENCOUNTER — Other Ambulatory Visit: Payer: Self-pay

## 2023-08-30 DIAGNOSIS — R52 Pain, unspecified: Secondary | ICD-10-CM | POA: Diagnosis present

## 2023-08-30 NOTE — ED Triage Notes (Signed)
 c

## 2023-08-30 NOTE — ED Triage Notes (Signed)
 Pt states she has generalized body aches. Pt ambulatory in waiting room.

## 2023-08-30 NOTE — Progress Notes (Signed)
 Patient currently has an open [facility] APS case. APS disposition pending.

## 2023-08-31 MED ORDER — ACETAMINOPHEN 500 MG PO TABS
1000.0000 mg | ORAL_TABLET | Freq: Once | ORAL | Status: AC
  Administered 2023-08-31: 1000 mg via ORAL
  Filled 2023-08-31: qty 2

## 2023-08-31 NOTE — ED Notes (Signed)
 PT D/C'D AFTER INSTRUCTIONS REVIEWED. PT VERBALIZED UNDERSTANDING. NAD REPORTED OR NOTED AT THIS TIME.

## 2023-08-31 NOTE — ED Notes (Signed)
PT REFUSED COVID SWAB.

## 2023-08-31 NOTE — ED Notes (Signed)
BUS PASS PROVIDED

## 2023-08-31 NOTE — ED Provider Notes (Signed)
  [facility] Emergency Department Provider Note MRN:  [id number]  Arrival date & time: 08/31/23     Chief Complaint   Generalized Body Aches   History of Present Illness   [name] is a 78 y.o. year-old female presents to the ED with chief complaint of generalized bodyaches.  She returns again to the emergency department for this complaint.  She has been seen 130 times in the past 6 months.  She is requesting a dose of Tylenol  and a cab voucher home.  She denies any new complaints tonight.  History provided by patient.   Review of Systems  Pertinent positive and negative review of systems noted in HPI.    Physical Exam   Vitals:   08/30/23 1801 08/30/23 2131  BP: 131/82 (!) 150/68  Pulse: (!) 57 (!) 53  Resp: 16 17  Temp: 98.2 F (36.8 C) 98 F (36.7 C)  SpO2: 99% 100%    CONSTITUTIONAL:  non toxic-appearing, NAD NEURO:  Alert and oriented x 3, CN 3-12 grossly intact EYES:  eyes equal and reactive ENT/NECK:  Supple, no stridor  CARDIO:  slight bradycardia, regular rhythm, appears well-perfused  PULM:  No respiratory distress,  GI/GU:  non-distended,  MSK/SPINE:  No gross deformities, no edema, moves all extremities  SKIN:  no rash, atraumatic   *Additional and/or pertinent findings included in MDM below  Diagnostic and Interventional Summary    EKG Interpretation Date/Time:    Ventricular Rate:    PR Interval:    QRS Duration:    QT Interval:    QTC Calculation:   R Axis:      Text Interpretation:         Labs Reviewed - No data to display  No orders to display    Medications  acetaminophen  (TYLENOL ) tablet 1,000 mg (1,000 mg Oral Given 08/31/23 0302)     Procedures  /  Critical Care Procedures  ED Course and Medical Decision Making  I have reviewed the triage vital signs, the nursing notes, and pertinent available records from the EMR.  Social Determinants Affecting Complexity of Care: Patient has no clinically  significant social determinants affecting this chief complaint..   ED Course:    Medical Decision Making Patient with typical complaint of generalized bodyaches.  She appears to be in her normal state of health.  I did offer a COVID and flu swab, but the patient declined.  She attributes this to a fall that occurred many weeks ago.  Will discharge home and will again encourage PCP follow-up.  Risk OTC drugs.         Consultants: No consultations were needed in caring for this patient.   Treatment and Plan: Emergency department workup does not suggest an emergent condition requiring admission or immediate intervention beyond  what has been performed at this time. The patient is safe for discharge and has  been instructed to return immediately for worsening symptoms, change in  symptoms or any other concerns    Final Clinical Impressions(s) / ED Diagnoses     ICD-10-CM   1. Body aches  R52       ED Discharge Orders     None         Discharge Instructions Discussed with and Provided to Patient:   Discharge Instructions   None      Vicky Charleston, PA-C 08/31/23 0357    Theadore Ozell HERO, MD 08/31/23 [phone]

## 2023-09-02 ENCOUNTER — Other Ambulatory Visit: Payer: Self-pay

## 2023-09-02 ENCOUNTER — Emergency Department (HOSPITAL_COMMUNITY)
Admission: EM | Admit: 2023-09-02 | Discharge: 2023-09-03 | Disposition: A | Discharge status: Home / Self Care | Attending: Emergency Medicine | Admitting: Emergency Medicine

## 2023-09-02 DIAGNOSIS — M791 Myalgia, unspecified site: Secondary | ICD-10-CM | POA: Diagnosis present

## 2023-09-02 DIAGNOSIS — G8929 Other chronic pain: Secondary | ICD-10-CM | POA: Diagnosis not present

## 2023-09-02 NOTE — ED Triage Notes (Signed)
 Pt here for generalized body aches. Ambulatory to triage

## 2023-09-03 MED ORDER — ACETAMINOPHEN 325 MG PO TABS
650.0000 mg | ORAL_TABLET | Freq: Once | ORAL | Status: AC
  Administered 2023-09-03 (×2): 650 mg via ORAL
  Filled 2023-09-03: qty 2

## 2023-09-03 NOTE — ED Notes (Signed)
 Pt asked RN where she could find tylenol  if she could buy it herself. Rn told her she could buy it at any grocery store or drug store. Pt then asked to be sent to the waiting room. RN told her she is not discharged. Pt reports I dont want to sit here I want to go to the waiting room

## 2023-09-03 NOTE — ED Notes (Signed)
 Pt verbalized understanding of discharge instructions.

## 2023-09-03 NOTE — ED Notes (Signed)
 Pt requesting money from RN for a cab home. When RN declined to give her money Pt requested RN ask others for money. RN declined.

## 2023-09-03 NOTE — ED Provider Notes (Signed)
 [facility] Emergency Department Provider Note MRN:  [id number]  Arrival date & time: 09/03/23     Chief Complaint   Muscle Pain   History of Present Illness   [name] is a 78 y.o. year-old female presents to the ED with chief complaint of generalized bodyaches.  She returns again tonight for her 127th visit in the past 6 months.  She is seen almost daily.  She states that she prefers to come to [facility] because it is closer to her home.  She does not want to establish care with a primary care doctor.  She is requesting something stronger than Tylenol  for her chronic pain.  She denies any new complaints..  History provided by patient.   Review of Systems  Pertinent positive and negative review of systems noted in HPI.    Physical Exam   Vitals:   09/02/23 2252  BP: (!) 155/58  Pulse: 66  Resp: 20  Temp: 98 F (36.7 C)  SpO2: 100%    CONSTITUTIONAL:  non toxic-appearing, NAD NEURO:  Alert and oriented x 3, CN 3-12 grossly intact EYES:  eyes equal and reactive ENT/NECK:  Supple, no stridor  CARDIO:  appears well-perfused  PULM:  No respiratory distress,  GI/GU:  non-distended,  MSK/SPINE:  No gross deformities, no edema, moves all extremities  SKIN:  no rash, atraumatic   *Additional and/or pertinent findings included in MDM below  Diagnostic and Interventional Summary    EKG Interpretation Date/Time:    Ventricular Rate:    PR Interval:    QRS Duration:    QT Interval:    QTC Calculation:   R Axis:      Text Interpretation:         Labs Reviewed - No data to display  No orders to display    Medications  acetaminophen  (TYLENOL ) tablet 650 mg (has no administration in time range)     Procedures  /  Critical Care Procedures  ED Course and Medical Decision Making  I have reviewed the triage vital signs, the nursing notes, and pertinent available records from the EMR.  Social Determinants Affecting Complexity  of Care: Patient has decreased access to medical care.   ED Course:    Medical Decision Making Patient here complaining of chronic pain.  She appears in her normal state of health.  She does not appear toxic.  She is requesting something stronger than Tylenol  for pains.  I discussed with the patient that she cannot be prescribed anything stronger than Tylenol  here.  I have encouraged her numerous times to follow-up with primary care.  She refuses, and states that she prefers to come to the hospital because it is close to her house.  Risk OTC drugs.         Consultants: No consultations were needed in caring for this patient.   Treatment and Plan: Emergency department workup does not suggest an emergent condition requiring admission or immediate intervention beyond  what has been performed at this time. The patient is safe for discharge and has  been instructed to return immediately for worsening symptoms, change in  symptoms or any other concerns    Final Clinical Impressions(s) / ED Diagnoses     ICD-10-CM   1. Other chronic pain  G89.29       ED Discharge Orders     None         Discharge Instructions Discussed with and Provided to Patient:   Discharge  Instructions   None      Vicky Charleston, PA-C 09/03/23 0224    Theadore Ozell HERO, MD 09/03/23 [phone]

## 2023-09-03 NOTE — ED Notes (Signed)
 Pt refuses to allow RN to close her door. When RN closes her door Pt opens it.

## 2023-09-04 ENCOUNTER — Encounter (HOSPITAL_COMMUNITY): Payer: Self-pay | Admitting: *Deleted

## 2023-09-04 ENCOUNTER — Other Ambulatory Visit: Payer: Self-pay

## 2023-09-04 ENCOUNTER — Emergency Department (HOSPITAL_COMMUNITY): Admission: EM | Admit: 2023-09-04 | Discharge: 2023-09-04 | Disposition: A | Discharge status: Home / Self Care

## 2023-09-04 DIAGNOSIS — M791 Myalgia, unspecified site: Secondary | ICD-10-CM | POA: Diagnosis present

## 2023-09-04 DIAGNOSIS — R52 Pain, unspecified: Secondary | ICD-10-CM

## 2023-09-04 MED ORDER — DICLOFENAC SODIUM 1 % EX GEL: 2.0000 g | Freq: Three times a day (TID) | CUTANEOUS | 0 refills | Status: DC | PRN

## 2023-09-04 NOTE — ED Provider Notes (Signed)
 [place] EMERGENCY DEPARTMENT AT [facility] Provider Note   CSN: [id number] Arrival date & time: 09/04/23  1945     Patient presents with: No chief complaint on file.   [name] is a 78 y.o. female.   78 year old female who is well-known to this emergency department presents for evaluation of bodyaches.  She has been coming here very frequently for the last few years for body aches.  States Tylenol  is not helping.  Denies any other recent falls or other symptoms or concerns.        Prior to Admission medications   Medication Sig Start Date End Date Taking? Authorizing Provider  acetaminophen  (TYLENOL ) 500 MG tablet Take 1 tablet (500 mg total) by mouth every 6 (six) hours as needed. 08/02/23   Elnor Jayson LABOR, DO  albuterol  (VENTOLIN  HFA) 108 (90 Base) MCG/ACT inhaler Inhale 1-2 puffs into the lungs every 6 (six) hours as needed for wheezing or shortness of breath. 04/22/20   Myrna Camelia HERO, NP  amLODipine  (NORVASC ) 10 MG tablet Take 1 tablet (10 mg total) by mouth daily. 01/23/23   Roemhildt, Lorin T, PA-C  chlorthalidone  (HYGROTON ) 25 MG tablet TAKE 1 TABLET (25 MG TOTAL) BY MOUTH DAILY. 11/20/21 11/20/22  Paseda, Folashade R, FNP  cloNIDine  (CATAPRES ) 0.2 MG tablet Take 1 tablet (0.2 mg total) by mouth 2 (two) times daily. 08/26/23   Bauer, Collin S, PA-C  diclofenac  Sodium (VOLTAREN  ARTHRITIS PAIN) 1 % GEL Apply 2 g topically 3 (three) times daily as needed. 09/04/23  Yes Joud Ingwersen L, DO  diclofenac  Sodium (VOLTAREN ) 1 % GEL Apply 4 g topically 4 (four) times daily. 08/15/23   [name], PA-C  diphenhydrAMINE  (BENADRYL ) 25 MG tablet Take 1 tablet (25 mg total) by mouth every 6 (six) hours as needed for itching or allergies. 10/03/22   [name], PA-C  gabapentin  (NEURONTIN ) 100 MG capsule Take 1 capsule (100 mg total) by mouth 3 (three) times daily as needed. 04/30/23   Bero, Michael M, MD  hydrocortisone  cream 1 % Apply to affected area 2 times daily 10/22/22    Jarold Olam HERO, PA-C  hydrOXYzine  (ATARAX ) 25 MG tablet Take 1 tablet (25 mg total) by mouth every 8 (eight) hours as needed. 11/23/22   Henderly, Britni A, PA-C  lidocaine  (LIDODERM ) 5 % Place 1 patch onto the skin daily. Remove & Discard patch within 12 hours or as directed by MD 08/02/23   Elnor Jayson LABOR, DO  loratadine  (CLARITIN ) 10 MG tablet Take 1 tablet (10 mg total) by mouth daily. 04/06/23   Barrett, Warren SAILOR, PA-C  metFORMIN  (GLUCOPHAGE ) 500 MG tablet Take 1 tablet (500 mg total) by mouth 2 (two) times daily with a meal. 11/03/22 11/03/23  Lang Norleen POUR, PA-C  methocarbamol  (ROBAXIN ) 500 MG tablet Take 1 tablet (500 mg total) by mouth 2 (two) times daily. 03/19/23   Ula Prentice SAUNDERS, MD  metoprolol  succinate (TOPROL -XL) 25 MG 24 hr tablet Take 1 tablet (25 mg total) by mouth daily. 06/18/23   Melvenia Motto, MD  naproxen  (NAPROSYN ) 375 MG tablet Take 1 tablet (375 mg total) by mouth 2 (two) times daily. 08/20/23   Logan Ubaldo NOVAK, PA-C  pantoprazole  (PROTONIX ) 20 MG tablet Take 1 tablet (20 mg total) by mouth daily. 10/10/21   Dean Clarity, MD  triamcinolone  cream (KENALOG ) 0.1 % Apply 1 Application topically 2 (two) times daily. 07/07/23   Vicky Charleston, PA-C    Allergies: Codeine, Ibuprofen , and Imdur [isosorbide nitrate]  Review of Systems  Constitutional:  Negative for chills and fever.  HENT:  Negative for ear pain and sore throat.   Eyes:  Negative for pain and visual disturbance.  Respiratory:  Negative for cough and shortness of breath.   Cardiovascular:  Negative for chest pain and palpitations.  Gastrointestinal:  Negative for abdominal pain and vomiting.  Genitourinary:  Negative for dysuria and hematuria.  Musculoskeletal:  Positive for neck pain. Negative for arthralgias and back pain.  Skin:  Negative for color change and rash.  Neurological:  Negative for seizures and syncope.  All other systems reviewed and are negative.   Updated Vital Signs BP (!) 137/56    Pulse (!) 57   Temp 98.2 F (36.8 C)   Resp 18   SpO2 98%   Physical Exam Vitals and nursing note reviewed.  Constitutional:      General: She is not in acute distress.    Appearance: Normal appearance. She is well-developed. She is not ill-appearing.  HENT:     Head: Normocephalic and atraumatic.  Eyes:     Conjunctiva/sclera: Conjunctivae normal.  Cardiovascular:     Rate and Rhythm: Normal rate and regular rhythm.     Heart sounds: No murmur heard. Pulmonary:     Effort: Pulmonary effort is normal. No respiratory distress.     Breath sounds: Normal breath sounds.  Abdominal:     Palpations: Abdomen is soft.     Tenderness: There is no abdominal tenderness.  Musculoskeletal:        General: No swelling.     Cervical back: Neck supple.  Skin:    General: Skin is warm and dry.     Capillary Refill: Capillary refill takes less than 2 seconds.  Neurological:     Mental Status: She is alert.  Psychiatric:        Mood and Affect: Mood normal.     (all labs ordered are listed, but only abnormal results are displayed) Labs Reviewed - No data to display  EKG: None  Radiology: No results found.   Procedures   Medications Ordered in the ED - No data to display                                  Medical Decision Making Social determinants of health: Patient comes in here almost daily for similar symptoms  Patient here with c-collar on her neck that she wears every day.  Will give her prescription for Voltaren  gel.  Advised to continue her other medications and follow-up with primary care as needed.  Vitals are stable and she otherwise appears well.  Able to walk without difficulty and no new falls or injuries.  She stable for discharge  Problems Addressed: Body aches: chronic illness or injury  Amount and/or Complexity of Data Reviewed External Data Reviewed: notes.    Details: Prior ED records reviewed patient was last seen here 2 days ago for the same  symptoms  Risk OTC drugs. Prescription drug management. Diagnosis or treatment significantly limited by social determinants of health.     Final diagnoses:  Body aches    ED Discharge Orders          Ordered    diclofenac  Sodium (VOLTAREN  ARTHRITIS PAIN) 1 % GEL  3 times daily PRN        09/04/23 2016  Gennaro Duwaine CROME, DO 09/04/23 2019

## 2023-09-04 NOTE — ED Triage Notes (Signed)
 Body pain

## 2023-09-06 ENCOUNTER — Other Ambulatory Visit: Payer: Self-pay

## 2023-09-06 ENCOUNTER — Emergency Department (HOSPITAL_COMMUNITY)
Admission: EM | Admit: 2023-09-06 | Discharge: 2023-09-07 | Disposition: A | Discharge status: Home / Self Care | Attending: Emergency Medicine | Admitting: Emergency Medicine

## 2023-09-06 ENCOUNTER — Encounter (HOSPITAL_COMMUNITY): Payer: Self-pay

## 2023-09-06 DIAGNOSIS — G8929 Other chronic pain: Secondary | ICD-10-CM | POA: Insufficient documentation

## 2023-09-06 DIAGNOSIS — E119 Type 2 diabetes mellitus without complications: Secondary | ICD-10-CM | POA: Diagnosis not present

## 2023-09-06 DIAGNOSIS — M791 Myalgia, unspecified site: Secondary | ICD-10-CM | POA: Insufficient documentation

## 2023-09-06 DIAGNOSIS — Z765 Malingerer [conscious simulation]: Secondary | ICD-10-CM | POA: Insufficient documentation

## 2023-09-06 DIAGNOSIS — I10 Essential (primary) hypertension: Secondary | ICD-10-CM | POA: Diagnosis not present

## 2023-09-06 DIAGNOSIS — Z8673 Personal history of transient ischemic attack (TIA), and cerebral infarction without residual deficits: Secondary | ICD-10-CM | POA: Diagnosis not present

## 2023-09-06 DIAGNOSIS — Z7984 Long term (current) use of oral hypoglycemic drugs: Secondary | ICD-10-CM | POA: Diagnosis not present

## 2023-09-06 DIAGNOSIS — Z79899 Other long term (current) drug therapy: Secondary | ICD-10-CM | POA: Diagnosis not present

## 2023-09-06 NOTE — ED Triage Notes (Signed)
 Pt reports she is here for her chronic all over body pain

## 2023-09-07 ENCOUNTER — Emergency Department (HOSPITAL_COMMUNITY)
Admission: EM | Admit: 2023-09-07 | Discharge: 2023-09-07 | Disposition: A | Discharge status: Home / Self Care | Source: Home / Self Care | Attending: Emergency Medicine | Admitting: Emergency Medicine

## 2023-09-07 ENCOUNTER — Other Ambulatory Visit: Payer: Self-pay

## 2023-09-07 ENCOUNTER — Encounter (HOSPITAL_COMMUNITY): Payer: Self-pay | Admitting: Radiology

## 2023-09-07 DIAGNOSIS — I10 Essential (primary) hypertension: Secondary | ICD-10-CM | POA: Insufficient documentation

## 2023-09-07 DIAGNOSIS — Z79899 Other long term (current) drug therapy: Secondary | ICD-10-CM | POA: Insufficient documentation

## 2023-09-07 DIAGNOSIS — Z765 Malingerer [conscious simulation]: Secondary | ICD-10-CM | POA: Insufficient documentation

## 2023-09-07 DIAGNOSIS — G8929 Other chronic pain: Secondary | ICD-10-CM | POA: Insufficient documentation

## 2023-09-07 DIAGNOSIS — Z7984 Long term (current) use of oral hypoglycemic drugs: Secondary | ICD-10-CM | POA: Insufficient documentation

## 2023-09-07 DIAGNOSIS — E119 Type 2 diabetes mellitus without complications: Secondary | ICD-10-CM | POA: Insufficient documentation

## 2023-09-07 DIAGNOSIS — M791 Myalgia, unspecified site: Secondary | ICD-10-CM | POA: Diagnosis not present

## 2023-09-07 DIAGNOSIS — Z8673 Personal history of transient ischemic attack (TIA), and cerebral infarction without residual deficits: Secondary | ICD-10-CM | POA: Insufficient documentation

## 2023-09-07 MED ORDER — NAPROXEN 250 MG PO TABS
500.0000 mg | ORAL_TABLET | Freq: Once | ORAL | Status: AC
  Administered 2023-09-07: 500 mg via ORAL
  Filled 2023-09-07: qty 2

## 2023-09-07 MED ORDER — NAPROXEN 375 MG PO TABS: 375.0000 mg | ORAL_TABLET | Freq: Two times a day (BID) | ORAL | 0 refills | Status: DC | PRN

## 2023-09-07 MED ORDER — ARTIFICIAL TEARS OPHTHALMIC OINT
1.0000 | TOPICAL_OINTMENT | Freq: Once | OPHTHALMIC | Status: DC
  Filled 2023-09-07: qty 3.5

## 2023-09-07 NOTE — Discharge Instructions (Signed)
 You were seen today for ongoing pain. Please take Tylenol  or ibuprofen  for your pain. You need to be seen by a primary care provider for further care. Return to the ER for concerns of new or worsening symptoms.

## 2023-09-07 NOTE — ED Notes (Signed)
 Pt vocalizes understanding that she has bedbugs. Pt is not compliant with hospital bedbug policy and keeps opening the treatment room door. ED staff have attempted to speak with pt about the spread of bugs and pt is starting to become verbally aggressive with ED staff.

## 2023-09-07 NOTE — ED Triage Notes (Signed)
 Pt endorses all over body pain that isn't improving with tylenol .

## 2023-09-07 NOTE — Discharge Instructions (Signed)
 You may use the prescribed pain medication and may purchase over the counter artificial tears from the pharmacy. You may follow with the opthlamologist below as needed for your vision.

## 2023-09-07 NOTE — ED Provider Notes (Signed)
 [place] [facility] Provider Note   CSN: [id number] Arrival date & time: 09/06/23  1949     Patient presents with: Pain   [name] is a 78 y.o. female who presents with her typical chronic pain all over after my follow-up in the past.  And request for artificial tears.  Well-known to this department.  No acute complaint.   HPI     Prior to Admission medications   Medication Sig Start Date End Date Taking? Authorizing Provider  acetaminophen  (TYLENOL ) 500 MG tablet Take 1 tablet (500 mg total) by mouth every 6 (six) hours as needed. 08/02/23   Elnor Jayson LABOR, DO  albuterol  (VENTOLIN  HFA) 108 (90 Base) MCG/ACT inhaler Inhale 1-2 puffs into the lungs every 6 (six) hours as needed for wheezing or shortness of breath. 04/22/20   Myrna Camelia HERO, NP  amLODipine  (NORVASC ) 10 MG tablet Take 1 tablet (10 mg total) by mouth daily. 01/23/23   Roemhildt, Lorin T, PA-C  chlorthalidone  (HYGROTON ) 25 MG tablet TAKE 1 TABLET (25 MG TOTAL) BY MOUTH DAILY. 11/20/21 11/20/22  Paseda, Folashade R, FNP  cloNIDine  (CATAPRES ) 0.2 MG tablet Take 1 tablet (0.2 mg total) by mouth 2 (two) times daily. 08/26/23   Bauer, Collin S, PA-C  diclofenac  Sodium (VOLTAREN  ARTHRITIS PAIN) 1 % GEL Apply 2 g topically 3 (three) times daily as needed. 09/04/23   Kammerer, Megan L, DO  diclofenac  Sodium (VOLTAREN ) 1 % GEL Apply 4 g topically 4 (four) times daily. 08/15/23   [name], PA-C  diphenhydrAMINE  (BENADRYL ) 25 MG tablet Take 1 tablet (25 mg total) by mouth every 6 (six) hours as needed for itching or allergies. 10/03/22   [name], PA-C  gabapentin  (NEURONTIN ) 100 MG capsule Take 1 capsule (100 mg total) by mouth 3 (three) times daily as needed. 04/30/23   Bero, Michael M, MD  hydrocortisone  cream 1 % Apply to affected area 2 times daily 10/22/22   Jarold Olam HERO, PA-C  hydrOXYzine  (ATARAX ) 25 MG tablet Take 1 tablet (25 mg total) by mouth every 8 (eight) hours as needed. 11/23/22    Henderly, Britni A, PA-C  lidocaine  (LIDODERM ) 5 % Place 1 patch onto the skin daily. Remove & Discard patch within 12 hours or as directed by MD 08/02/23   Elnor Jayson A, DO  loratadine  (CLARITIN ) 10 MG tablet Take 1 tablet (10 mg total) by mouth daily. 04/06/23   Barrett, Jamie N, PA-C  metFORMIN  (GLUCOPHAGE ) 500 MG tablet Take 1 tablet (500 mg total) by mouth 2 (two) times daily with a meal. 11/03/22 11/03/23  Lang Norleen POUR, PA-C  methocarbamol  (ROBAXIN ) 500 MG tablet Take 1 tablet (500 mg total) by mouth 2 (two) times daily. 03/19/23   Ula Prentice SAUNDERS, MD  metoprolol  succinate (TOPROL -XL) 25 MG 24 hr tablet Take 1 tablet (25 mg total) by mouth daily. 06/18/23   Melvenia Motto, MD  naproxen  (NAPROSYN ) 375 MG tablet Take 1 tablet (375 mg total) by mouth 2 (two) times daily. 08/20/23   Logan Ubaldo NOVAK, PA-C  pantoprazole  (PROTONIX ) 20 MG tablet Take 1 tablet (20 mg total) by mouth daily. 10/10/21   Dean Clarity, MD  triamcinolone  cream (KENALOG ) 0.1 % Apply 1 Application topically 2 (two) times daily. 07/07/23   Vicky Charleston, PA-C    Allergies: Codeine, Ibuprofen , and Imdur [isosorbide nitrate]    Review of Systems  Musculoskeletal:  Positive for myalgias.    Updated Vital Signs BP (!) 183/64 (BP Location:  Right Arm)   Pulse (!) 54   Temp 97.8 F (36.6 C)   Resp 15   Ht 5' 4 (1.626 m)   Wt 50.8 kg   SpO2 99%   BMI 19.22 kg/m   Physical Exam Vitals and nursing note reviewed.  Constitutional:      Appearance: She is not ill-appearing or toxic-appearing.  HENT:     Head: Normocephalic and atraumatic.  Eyes:     General: Lids are normal. Vision grossly intact. No scleral icterus.       Right eye: No discharge.        Left eye: No discharge.     Extraocular Movements: Extraocular movements intact.     Conjunctiva/sclera: Conjunctivae normal.     Pupils: Pupils are equal, round, and reactive to light.  Cardiovascular:     Heart sounds: Normal heart sounds. No murmur  heard. Pulmonary:     Effort: Pulmonary effort is normal.  Abdominal:     Palpations: Abdomen is soft.  Skin:    General: Skin is warm and dry.     Capillary Refill: Capillary refill takes less than 2 seconds.  Neurological:     General: No focal deficit present.     Mental Status: She is alert.  Psychiatric:        Mood and Affect: Mood normal.     (all labs ordered are listed, but only abnormal results are displayed) Labs Reviewed - No data to display  EKG: None  Radiology: No results found.   Procedures   Medications Ordered in the ED - No data to display                                  Medical Decision Making 78 year old female presents with chronic pain all over.  Hypertension intake and vitals otherwise normal.  Cardiopulmonary nausea vomiting.  Patient not in any acute distress.  Risk Prescription drug management.   Clinical picture was consistent with malingering no acute medical complaint or indication for further ED workup at this time.  Clinical concern for emergent underlying condition or morbidity with inpatient management is exceedingly low  [name] voiced understanding of her medical evaluation and treatment plan. Each of their questions answered to their expressed satisfaction.  Return precautions were given.  Patient is well-appearing, stable, and was discharged in good condition.  This chart was dictated using voice recognition software, Dragon. Despite the best efforts of this provider to proofread and correct errors, errors may still occur which can change documentation meaning.       Final diagnoses:  None    ED Discharge Orders     None          [name] 09/07/23 9367    Palumbo, April, MD 09/07/23 [phone]

## 2023-09-08 ENCOUNTER — Encounter (HOSPITAL_COMMUNITY): Payer: Self-pay | Admitting: *Deleted

## 2023-09-08 ENCOUNTER — Emergency Department (HOSPITAL_COMMUNITY)
Admission: EM | Admit: 2023-09-08 | Discharge: 2023-09-08 | Disposition: A | Discharge status: Home / Self Care | Attending: Emergency Medicine | Admitting: Emergency Medicine

## 2023-09-08 ENCOUNTER — Other Ambulatory Visit: Payer: Self-pay

## 2023-09-08 ENCOUNTER — Emergency Department (HOSPITAL_COMMUNITY)
Admission: EM | Admit: 2023-09-08 | Discharge: 2023-09-08 | Disposition: A | Discharge status: Home / Self Care | Source: Home / Self Care | Attending: Emergency Medicine | Admitting: Emergency Medicine

## 2023-09-08 DIAGNOSIS — M791 Myalgia, unspecified site: Secondary | ICD-10-CM | POA: Insufficient documentation

## 2023-09-08 DIAGNOSIS — Z79899 Other long term (current) drug therapy: Secondary | ICD-10-CM | POA: Diagnosis not present

## 2023-09-08 DIAGNOSIS — G8929 Other chronic pain: Secondary | ICD-10-CM

## 2023-09-08 DIAGNOSIS — R52 Pain, unspecified: Secondary | ICD-10-CM

## 2023-09-08 NOTE — ED Triage Notes (Signed)
 THE PT WANTS MED  SHE JUST LEFT EARLIER TODAY

## 2023-09-08 NOTE — ED Notes (Signed)
 Pt states she comes here every day because she's lonely.

## 2023-09-08 NOTE — ED Triage Notes (Signed)
 PT is here c/o chronic pain.
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
## 2023-09-08 NOTE — Discharge Instructions (Signed)
Your evaluation today has been largely reassuring.  But, it is important that you monitor your condition carefully, and do not hesitate to return to the ED if you develop new, or concerning changes in your condition. ° °Otherwise, please follow-up with your physician for appropriate ongoing care. ° °

## 2023-09-09 ENCOUNTER — Other Ambulatory Visit: Payer: Self-pay

## 2023-09-09 ENCOUNTER — Encounter (HOSPITAL_COMMUNITY): Payer: Self-pay

## 2023-09-09 ENCOUNTER — Emergency Department (HOSPITAL_COMMUNITY)
Admission: EM | Admit: 2023-09-09 | Discharge: 2023-09-10 | Disposition: A | Discharge status: Home / Self Care | Attending: Emergency Medicine | Admitting: Emergency Medicine

## 2023-09-09 DIAGNOSIS — G8929 Other chronic pain: Secondary | ICD-10-CM | POA: Diagnosis not present

## 2023-09-09 DIAGNOSIS — M791 Myalgia, unspecified site: Secondary | ICD-10-CM | POA: Diagnosis present

## 2023-09-09 NOTE — ED Triage Notes (Signed)
Pt c/o pain all over

## 2023-09-10 MED ORDER — ACETAMINOPHEN 325 MG PO TABS
ORAL_TABLET | ORAL | Status: AC
  Filled 2023-09-10: qty 2

## 2023-09-10 MED ORDER — ACETAMINOPHEN 325 MG PO TABS
650.0000 mg | ORAL_TABLET | Freq: Once | ORAL | Status: AC
  Administered 2023-09-10: 650 mg via ORAL
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
## 2023-09-10 NOTE — ED Notes (Signed)
 ..  The patient is A&OX4, ambulatory at d/c with independent steady gait, NAD. Pt verbalized understanding of d/c instructions and follow up care.

## 2023-09-13 ENCOUNTER — Emergency Department (HOSPITAL_COMMUNITY)
Admission: EM | Admit: 2023-09-13 | Discharge: 2023-09-13 | Disposition: A | Discharge status: Home / Self Care | Attending: Emergency Medicine | Admitting: Emergency Medicine

## 2023-09-13 ENCOUNTER — Other Ambulatory Visit: Payer: Self-pay

## 2023-09-13 ENCOUNTER — Encounter (HOSPITAL_COMMUNITY): Payer: Self-pay | Admitting: *Deleted

## 2023-09-13 DIAGNOSIS — G8929 Other chronic pain: Secondary | ICD-10-CM | POA: Insufficient documentation

## 2023-09-13 MED ORDER — NAPROXEN 250 MG PO TABS
500.0000 mg | ORAL_TABLET | Freq: Once | ORAL | Status: AC
  Administered 2023-09-13: 500 mg via ORAL
  Filled 2023-09-13: qty 2

## 2023-09-13 NOTE — ED Triage Notes (Signed)
 Tonight the pt wants stronger medicine
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
## 2023-09-13 NOTE — Discharge Instructions (Signed)
 Please follow-up with your primary care doctor if you have 1.  You may return to the emergency department for any worsening symptoms.

## 2023-09-13 NOTE — ED Notes (Signed)
 PT given a rag and assisted with washing her back.

## 2023-09-14 ENCOUNTER — Encounter (HOSPITAL_COMMUNITY): Payer: Self-pay | Admitting: Emergency Medicine

## 2023-09-14 ENCOUNTER — Emergency Department (HOSPITAL_COMMUNITY)
Admission: EM | Admit: 2023-09-14 | Discharge: 2023-09-14 | Disposition: A | Discharge status: Home / Self Care | Attending: Emergency Medicine | Admitting: Emergency Medicine

## 2023-09-14 ENCOUNTER — Emergency Department (HOSPITAL_COMMUNITY)
Admission: EM | Admit: 2023-09-14 | Discharge: 2023-09-15 | Disposition: A | Discharge status: Home / Self Care | Source: Home / Self Care

## 2023-09-14 DIAGNOSIS — G8929 Other chronic pain: Secondary | ICD-10-CM | POA: Insufficient documentation

## 2023-09-14 DIAGNOSIS — H04129 Dry eye syndrome of unspecified lacrimal gland: Secondary | ICD-10-CM

## 2023-09-14 DIAGNOSIS — H04123 Dry eye syndrome of bilateral lacrimal glands: Secondary | ICD-10-CM | POA: Insufficient documentation

## 2023-09-14 DIAGNOSIS — I159 Secondary hypertension, unspecified: Secondary | ICD-10-CM | POA: Insufficient documentation

## 2023-09-14 DIAGNOSIS — Z79899 Other long term (current) drug therapy: Secondary | ICD-10-CM | POA: Insufficient documentation

## 2023-09-14 DIAGNOSIS — H538 Other visual disturbances: Secondary | ICD-10-CM | POA: Insufficient documentation

## 2023-09-14 DIAGNOSIS — G8921 Chronic pain due to trauma: Secondary | ICD-10-CM | POA: Diagnosis not present

## 2023-09-14 MED ORDER — POLYVINYL ALCOHOL 1.4 % OP SOLN: 2.0000 [drp] | OPHTHALMIC | Status: DC | PRN

## 2023-09-14 MED ORDER — POLYVINYL ALCOHOL 1.4 % OP SOLN: 1.0000 [drp] | OPHTHALMIC | 0 refills | Status: DC | PRN

## 2023-09-14 MED ORDER — NAPROXEN 250 MG PO TABS
500.0000 mg | ORAL_TABLET | Freq: Once | ORAL | Status: AC
  Administered 2023-09-14: 500 mg via ORAL
  Filled 2023-09-14: qty 2

## 2023-09-14 NOTE — ED Provider Notes (Signed)
 [name]   CSN: [id number] Arrival date & time: 09/14/23  9664     Patient presents with: No chief complaint on file.   [name] is a 78 y.o. female whose primary concern today is of blurry vision and dry eyes.  She has been seen frequently most recently yesterday for chronic pain and was managed with Naprosyn .  In regards to her pain, she does not present with any acute changes in her condition, no recent falls or injuries.  She states that she uses an eyedrop at baseline however is unable to elaborate as to what she uses.  States that she has chronically blurred vision.   HPI     Prior to Admission medications   Medication Sig Start Date End Date Taking? Authorizing Provider  artificial tears ophthalmic solution Place 1 drop into both eyes as needed for dry eyes. 09/14/23  Yes [name] BROCKS, PA  acetaminophen  (TYLENOL ) 500 MG tablet Take 1 tablet (500 mg total) by mouth every 6 (six) hours as needed. 08/02/23   Elnor Jayson LABOR, DO  albuterol  (VENTOLIN  HFA) 108 (90 Base) MCG/ACT inhaler Inhale 1-2 puffs into the lungs every 6 (six) hours as needed for wheezing or shortness of breath. 04/22/20   Myrna Camelia HERO, NP  amLODipine  (NORVASC ) 10 MG tablet Take 1 tablet (10 mg total) by mouth daily. 01/23/23   Roemhildt, Lorin T, PA-C  chlorthalidone  (HYGROTON ) 25 MG tablet TAKE 1 TABLET (25 MG TOTAL) BY MOUTH DAILY. 11/20/21 11/20/22  Paseda, Folashade R, FNP  cloNIDine  (CATAPRES ) 0.2 MG tablet Take 1 tablet (0.2 mg total) by mouth 2 (two) times daily. 08/26/23   Bauer, Collin S, PA-C  diclofenac  Sodium (VOLTAREN  ARTHRITIS PAIN) 1 % GEL Apply 2 g topically 3 (three) times daily as needed. 09/04/23   Kammerer, Megan L, DO  diclofenac  Sodium (VOLTAREN ) 1 % GEL Apply 4 g topically 4 (four) times daily. 08/15/23   [name], PA-C  diphenhydrAMINE  (BENADRYL ) 25 MG tablet Take 1 tablet (25 mg total) by mouth every 6 (six) hours as needed  for itching or allergies. 10/03/22   [name], PA-C  gabapentin  (NEURONTIN ) 100 MG capsule Take 1 capsule (100 mg total) by mouth 3 (three) times daily as needed. 04/30/23   Bero, Michael M, MD  hydrocortisone  cream 1 % Apply to affected area 2 times daily 10/22/22   Jarold Olam HERO, PA-C  hydrOXYzine  (ATARAX ) 25 MG tablet Take 1 tablet (25 mg total) by mouth every 8 (eight) hours as needed. 11/23/22   Henderly, Britni A, PA-C  lidocaine  (LIDODERM ) 5 % Place 1 patch onto the skin daily. Remove & Discard patch within 12 hours or as directed by MD 08/02/23   Elnor Jayson LABOR, DO  loratadine  (CLARITIN ) 10 MG tablet Take 1 tablet (10 mg total) by mouth daily. 04/06/23   Barrett, Jamie N, PA-C  metFORMIN  (GLUCOPHAGE ) 500 MG tablet Take 1 tablet (500 mg total) by mouth 2 (two) times daily with a meal. 11/03/22 11/03/23  Lang Norleen POUR, PA-C  methocarbamol  (ROBAXIN ) 500 MG tablet Take 1 tablet (500 mg total) by mouth 2 (two) times daily. 03/19/23   Ula Prentice SAUNDERS, MD  metoprolol  succinate (TOPROL -XL) 25 MG 24 hr tablet Take 1 tablet (25 mg total) by mouth daily. 06/18/23   Melvenia Motto, MD  naproxen  (NAPROSYN ) 375 MG tablet Take 1 tablet (375 mg total) by mouth 2 (two) times daily as needed for moderate pain (pain  score 4-6). 09/07/23   Sponseller, Pleasant SAUNDERS, PA-C  pantoprazole  (PROTONIX ) 20 MG tablet Take 1 tablet (20 mg total) by mouth daily. 10/10/21   Dean Clarity, MD  triamcinolone  cream (KENALOG ) 0.1 % Apply 1 Application topically 2 (two) times daily. 07/07/23   Vicky Charleston, PA-C    Allergies: Codeine, Ibuprofen , and Imdur [isosorbide nitrate]    Review of Systems  Eyes:  Positive for visual disturbance.  Musculoskeletal:  Positive for arthralgias.  All other systems reviewed and are negative.   Updated Vital Signs BP (!) 171/83   Pulse 62   Temp 98.7 F (37.1 C) (Oral)   Resp 16   SpO2 99%   Physical Exam Vitals and nursing note reviewed.  Constitutional:      General: She is not in  acute distress.    Appearance: Normal appearance.     Interventions: Cervical collar in place.     Comments: Soft c-collar in place.  HENT:     Head: Normocephalic and atraumatic.     Mouth/Throat:     Mouth: Mucous membranes are moist.     Pharynx: Oropharynx is clear.  Eyes:     General: Lids are normal. Vision grossly intact. Gaze aligned appropriately.     Extraocular Movements: Extraocular movements intact.     Conjunctiva/sclera: Conjunctivae normal.     Pupils: Pupils are equal, round, and reactive to light.     Comments: Arcus senilis noted bilaterally, otherwise conjunctive appear normal, pupils equal and reactive to light and accommodation.  Cardiovascular:     Rate and Rhythm: Normal rate and regular rhythm.     Pulses: Normal pulses.     Heart sounds: Normal heart sounds. No murmur heard.    No friction rub. No gallop.  Pulmonary:     Effort: Pulmonary effort is normal.     Breath sounds: Normal breath sounds.  Abdominal:     General: Abdomen is flat. Bowel sounds are normal.     Palpations: Abdomen is soft.  Musculoskeletal:        General: Normal range of motion.     Cervical back: Normal range of motion and neck supple.     Right lower leg: No edema.     Left lower leg: No edema.  Skin:    General: Skin is warm and dry.     Capillary Refill: Capillary refill takes less than 2 seconds.  Neurological:     General: No focal deficit present.     Mental Status: She is alert. Mental status is at baseline.  Psychiatric:        Mood and Affect: Mood normal.     (all labs ordered are listed, but only abnormal results are displayed) Labs Reviewed - No data to display  EKG: None  Radiology: No results found.   Procedures   Medications Ordered in the ED  artificial tears ophthalmic solution 2 drop (has no administration in time range)  naproxen  (NAPROSYN ) tablet 500 mg (500 mg Oral Given 09/14/23 0815)                                    Medical Decision  Making  Medical Decision Making:   [name] is a 78 y.o. female who presented to the ED today with chronic pain and chronic blurred vision detailed above.     Complete initial physical exam performed, notably the patient  was alert and  oriented in no apparent distress.  She has a soft c-collar in place which is chronically worn, has no notable exam findings..    Reviewed and confirmed nursing documentation for past medical history, family history, social history.    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  Considered acute injury as cause of pain however she has not have any history of recent falls or trauma which would precipitate an acute injury.  Initial Plan:  Based on her presentation, and review of medical history, will defer further imaging and lab evaluation at this time. Regarding her chronic pain, we will provide her with another dose of naproxen , plan for discharge with outpatient follow-up.  Records show NSAID allergy in the past, however patient tolerated naproxen  well yesterday without any complications. Regarding her dry eye, we will provide her with natural tears, encouraged ophthalmology follow-up.   Reassessment and Plan:   At this time, her vital signs are stable, she has no concerning signs or symptoms at this time, so she appears stable for discharge with outpatient follow-up.  Careful return precautions have been given to this patient.  Encouraged outpatient ophthalmology follow-up and referral given for same, patient states that she hesitates to go to ophthalmology secondary to concerns of transportation.  Otherwise patient stable for discharge after administration of naproxen  as well as as natural tears eyedrops.       Final diagnoses:  Other chronic pain  Dry eye    ED Discharge Orders           Ordered    artificial tears ophthalmic solution  As needed        09/14/23 0818               [name] BROCKS, PA 09/14/23 9180    [name], MD 09/15/23 [phone]

## 2023-09-14 NOTE — ED Triage Notes (Signed)
 Pt here asking for stronger pain meds

## 2023-09-14 NOTE — ED Triage Notes (Addendum)
 Pt requesting eye drops for blurry vision/ dry eyes in both eyes, that she has had in both eyes for 2 weeks. Denies other sx

## 2023-09-14 NOTE — Discharge Instructions (Signed)
 Heavily encourage you to follow-up with a eye specialist to evaluate your blurred vision.  While the natural tears drops may help somewhat, an eye doctor will be able to give you a much more in-depth evaluation and adequate management of this.
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
## 2023-09-15 ENCOUNTER — Emergency Department (HOSPITAL_COMMUNITY)
Admission: EM | Admit: 2023-09-15 | Discharge: 2023-09-15 | Disposition: A | Discharge status: Home / Self Care | Attending: Emergency Medicine | Admitting: Emergency Medicine

## 2023-09-15 DIAGNOSIS — H04123 Dry eye syndrome of bilateral lacrimal glands: Secondary | ICD-10-CM | POA: Diagnosis not present

## 2023-09-15 DIAGNOSIS — G8921 Chronic pain due to trauma: Secondary | ICD-10-CM | POA: Diagnosis present

## 2023-09-15 MED ORDER — AMLODIPINE BESYLATE 5 MG PO TABS
10.0000 mg | ORAL_TABLET | Freq: Once | ORAL | Status: AC
  Administered 2023-09-15: 10 mg via ORAL
  Filled 2023-09-15: qty 2

## 2023-09-15 MED ORDER — NAPROXEN 250 MG PO TABS
500.0000 mg | ORAL_TABLET | Freq: Once | ORAL | Status: DC
  Filled 2023-09-15: qty 2

## 2023-09-15 MED ORDER — ACETAMINOPHEN 500 MG PO TABS
1000.0000 mg | ORAL_TABLET | Freq: Once | ORAL | Status: AC
  Administered 2023-09-15: 1000 mg via ORAL
  Filled 2023-09-15: qty 2

## 2023-09-15 NOTE — ED Triage Notes (Signed)
Pt c/o body pain.
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
## 2023-09-15 NOTE — Discharge Instructions (Signed)
Please take your blood pressure medications

## 2023-09-15 NOTE — ED Provider Notes (Signed)
  EMERGENCY DEPARTMENT AT [facility] Provider Note   CSN: [id number] Arrival date & time: 09/14/23  8085     Patient presents with: No chief complaint on file.   [name] is a 78 y.o. female.   78 year old female presenting emergency department for chronic pain with pain all over this is her 18th visit this month alone for the same complaint.  No new trauma or injuries.  Blood pressure elevated, did not take blood pressure medications        Prior to Admission medications   Medication Sig Start Date End Date Taking? Authorizing Provider  acetaminophen  (TYLENOL ) 500 MG tablet Take 1 tablet (500 mg total) by mouth every 6 (six) hours as needed. 08/02/23   Elnor Jayson LABOR, DO  albuterol  (VENTOLIN  HFA) 108 (90 Base) MCG/ACT inhaler Inhale 1-2 puffs into the lungs every 6 (six) hours as needed for wheezing or shortness of breath. 04/22/20   Myrna Camelia HERO, NP  amLODipine  (NORVASC ) 10 MG tablet Take 1 tablet (10 mg total) by mouth daily. 01/23/23   Roemhildt, Lorin T, PA-C  artificial tears ophthalmic solution Place 1 drop into both eyes as needed for dry eyes. 09/14/23   [name] BROCKS, PA  chlorthalidone  (HYGROTON ) 25 MG tablet TAKE 1 TABLET (25 MG TOTAL) BY MOUTH DAILY. 11/20/21 11/20/22  Paseda, Folashade R, FNP  cloNIDine  (CATAPRES ) 0.2 MG tablet Take 1 tablet (0.2 mg total) by mouth 2 (two) times daily. 08/26/23   Bauer, Collin S, PA-C  diclofenac  Sodium (VOLTAREN  ARTHRITIS PAIN) 1 % GEL Apply 2 g topically 3 (three) times daily as needed. 09/04/23   Kammerer, Megan L, DO  diclofenac  Sodium (VOLTAREN ) 1 % GEL Apply 4 g topically 4 (four) times daily. 08/15/23   [name], PA-C  diphenhydrAMINE  (BENADRYL ) 25 MG tablet Take 1 tablet (25 mg total) by mouth every 6 (six) hours as needed for itching or allergies. 10/03/22   [name], PA-C  gabapentin  (NEURONTIN ) 100 MG capsule Take 1 capsule (100 mg total) by mouth 3 (three) times daily as needed. 04/30/23   Bero,  Michael M, MD  hydrocortisone  cream 1 % Apply to affected area 2 times daily 10/22/22   Jarold Olam HERO, PA-C  hydrOXYzine  (ATARAX ) 25 MG tablet Take 1 tablet (25 mg total) by mouth every 8 (eight) hours as needed. 11/23/22   Henderly, Britni A, PA-C  lidocaine  (LIDODERM ) 5 % Place 1 patch onto the skin daily. Remove & Discard patch within 12 hours or as directed by MD 08/02/23   Elnor Jayson LABOR, DO  loratadine  (CLARITIN ) 10 MG tablet Take 1 tablet (10 mg total) by mouth daily. 04/06/23   Barrett, Warren SAILOR, PA-C  metFORMIN  (GLUCOPHAGE ) 500 MG tablet Take 1 tablet (500 mg total) by mouth 2 (two) times daily with a meal. 11/03/22 11/03/23  Lang Norleen POUR, PA-C  methocarbamol  (ROBAXIN ) 500 MG tablet Take 1 tablet (500 mg total) by mouth 2 (two) times daily. 03/19/23   Ula Prentice SAUNDERS, MD  metoprolol  succinate (TOPROL -XL) 25 MG 24 hr tablet Take 1 tablet (25 mg total) by mouth daily. 06/18/23   Melvenia Motto, MD  naproxen  (NAPROSYN ) 375 MG tablet Take 1 tablet (375 mg total) by mouth 2 (two) times daily as needed for moderate pain (pain score 4-6). 09/07/23   Sponseller, Pleasant R, PA-C  pantoprazole  (PROTONIX ) 20 MG tablet Take 1 tablet (20 mg total) by mouth daily. 10/10/21   Dean Clarity, MD  triamcinolone  cream (KENALOG ) 0.1 %  Apply 1 Application topically 2 (two) times daily. 07/07/23   Vicky Charleston, PA-C    Allergies: Codeine, Ibuprofen , and Imdur [isosorbide nitrate]    Review of Systems  Updated Vital Signs BP (!) 216/64 (BP Location: Left Arm)   Pulse (!) 56   Temp 98.1 F (36.7 C)   Resp 20   SpO2 100%   Physical Exam Vitals and nursing note reviewed.  Constitutional:      General: She is not in acute distress.    Appearance: She is not toxic-appearing.  HENT:     Head: Normocephalic and atraumatic.     Nose: Nose normal.     Mouth/Throat:     Mouth: Mucous membranes are moist.  Eyes:     Conjunctiva/sclera: Conjunctivae normal.  Cardiovascular:     Rate and Rhythm: Normal rate.   Pulmonary:     Effort: Pulmonary effort is normal.  Abdominal:     General: Abdomen is flat. There is no distension.  Musculoskeletal:        General: Normal range of motion.  Skin:    General: Skin is warm.     Capillary Refill: Capillary refill takes less than 2 seconds.  Neurological:     General: No focal deficit present.     Mental Status: She is alert. Mental status is at baseline.  Psychiatric:        Mood and Affect: Mood normal.        Behavior: Behavior normal.     (all labs ordered are listed, but only abnormal results are displayed) Labs Reviewed - No data to display  EKG: None  Radiology: No results found.   Procedures   Medications Ordered in the ED  amLODipine  (NORVASC ) tablet 10 mg (has no administration in time range)  acetaminophen  (TYLENOL ) tablet 1,000 mg (has no administration in time range)                                    Medical Decision Making Is a 78 year old female presenting for chronic pain.  Well-known to the emergency department.  Seen 18 times this month alone for the same complaint.  Afebrile nontachycardic, is hypertensive.  However, asymptomatic.  Will give home blood pressure medications.  Will also give Tylenol  for her pain.  Again discussed needing follow-up with her primary doctor for better pain management.  Risk OTC drugs. Prescription drug management.       Final diagnoses:  Secondary hypertension  Other chronic pain    ED Discharge Orders     None          Neysa Caron PARAS, DO 09/15/23 0720

## 2023-09-15 NOTE — Discharge Instructions (Signed)
 Follow up outpatient, return for any worsening symptoms
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
## 2023-09-16 ENCOUNTER — Encounter (HOSPITAL_COMMUNITY): Payer: Self-pay

## 2023-09-16 ENCOUNTER — Emergency Department (HOSPITAL_COMMUNITY)
Admission: EM | Admit: 2023-09-16 | Discharge: 2023-09-17 | Disposition: A | Discharge status: Home / Self Care | Attending: Emergency Medicine | Admitting: Emergency Medicine

## 2023-09-16 ENCOUNTER — Other Ambulatory Visit: Payer: Self-pay

## 2023-09-16 DIAGNOSIS — M791 Myalgia, unspecified site: Secondary | ICD-10-CM | POA: Diagnosis present

## 2023-09-16 DIAGNOSIS — G894 Chronic pain syndrome: Secondary | ICD-10-CM | POA: Diagnosis present

## 2023-09-16 DIAGNOSIS — G8921 Chronic pain due to trauma: Secondary | ICD-10-CM

## 2023-09-16 DIAGNOSIS — G8929 Other chronic pain: Secondary | ICD-10-CM | POA: Insufficient documentation

## 2023-09-16 NOTE — ED Triage Notes (Signed)
 Patient states her body hurts.

## 2023-09-17 ENCOUNTER — Other Ambulatory Visit: Payer: Self-pay

## 2023-09-17 ENCOUNTER — Emergency Department (HOSPITAL_COMMUNITY)
Admission: EM | Admit: 2023-09-17 | Discharge: 2023-09-18 | Disposition: A | Discharge status: Home / Self Care | Source: Home / Self Care

## 2023-09-17 ENCOUNTER — Encounter (HOSPITAL_COMMUNITY): Payer: Self-pay

## 2023-09-17 DIAGNOSIS — G894 Chronic pain syndrome: Secondary | ICD-10-CM | POA: Insufficient documentation

## 2023-09-17 NOTE — Discharge Instructions (Signed)
 You may use extra strength Tylenol  per the package instructions for your pain.  Return to the ER if you develop new or worsening symptoms.
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
## 2023-09-18 NOTE — ED Provider Notes (Signed)
  [place] EMERGENCY DEPARTMENT AT [facility] Provider Note   CSN: [id number] Arrival date & time: 09/17/23  1850     History Chief Complaint  Patient presents with   Pain    HPI [name] is a 78 y.o. female who initially checked in for chronic pain.  I was called out to the lobby as she is requesting discharge. Her pain is at its baseline and states that she is wants to be able to go home. She is ambulatory without difficulty in no acute distress.   Patient's recorded medical, surgical, social, medication list and allergies were reviewed in the Snapshot window as part of the initial history.   Review of Systems   Review of Systems  Constitutional:  Negative for chills and fever.  HENT:  Negative for ear pain and sore throat.   Eyes:  Negative for pain and visual disturbance.  Respiratory:  Negative for cough and shortness of breath.   Cardiovascular:  Negative for chest pain and palpitations.  Gastrointestinal:  Negative for abdominal pain and vomiting.  Genitourinary:  Negative for dysuria and hematuria.  Musculoskeletal:  Negative for arthralgias and back pain.  Skin:  Negative for color change and rash.  Neurological:  Negative for seizures and syncope.  All other systems reviewed and are negative.   Physical Exam Updated Vital Signs BP (!) 158/74   Pulse 99   Temp 98 F (36.7 C)   Resp 16   Ht 5' 4 (1.626 m)   Wt 50.3 kg   SpO2 99%   BMI 19.05 kg/m  Physical Exam Constitutional:      General: She is not in acute distress.    Appearance: She is not ill-appearing or toxic-appearing.  HENT:     Head: Normocephalic and atraumatic.  Eyes:     Extraocular Movements: Extraocular movements intact.     Pupils: Pupils are equal, round, and reactive to light.  Cardiovascular:     Rate and Rhythm: Normal rate.  Pulmonary:     Effort: No respiratory distress.  Abdominal:     General: Abdomen is flat.  Musculoskeletal:        General: No  swelling, deformity or signs of injury.     Cervical back: Normal range of motion. No rigidity.  Skin:    General: Skin is warm and dry.  Neurological:     General: No focal deficit present.     Mental Status: She is alert and oriented to person, place, and time.  Psychiatric:        Mood and Affect: Mood normal.      ED Course/ Medical Decision Making/ A&P    Procedures Procedures   Medications Ordered in ED Medications - No data to display  Medical Decision Making:   Patient has no acute complaints at this time.  Ambulatory without difficulty.  Requesting discharge to go home.   Clinical Impression:  1. Chronic pain syndrome      Discharge   Final Clinical Impression(s) / ED Diagnoses Final diagnoses:  Chronic pain syndrome    Rx / DC Orders ED Discharge Orders     None         Jerral Meth, MD 09/18/23 [phone]

## 2023-09-18 NOTE — ED Notes (Signed)
 Patient refused vials. Patient asked for money to help her get home. I advised I could not give her any money. Offered the patient a bus pass and she declined.

## 2023-09-19 ENCOUNTER — Encounter (HOSPITAL_COMMUNITY): Payer: Self-pay

## 2023-09-19 ENCOUNTER — Other Ambulatory Visit: Payer: Self-pay

## 2023-09-19 ENCOUNTER — Emergency Department (HOSPITAL_COMMUNITY)
Admission: EM | Admit: 2023-09-19 | Discharge: 2023-09-20 | Disposition: A | Discharge status: Home / Self Care | Attending: Emergency Medicine | Admitting: Emergency Medicine

## 2023-09-19 DIAGNOSIS — Z79899 Other long term (current) drug therapy: Secondary | ICD-10-CM | POA: Insufficient documentation

## 2023-09-19 DIAGNOSIS — W19XXXA Unspecified fall, initial encounter: Secondary | ICD-10-CM | POA: Insufficient documentation

## 2023-09-19 DIAGNOSIS — E119 Type 2 diabetes mellitus without complications: Secondary | ICD-10-CM | POA: Insufficient documentation

## 2023-09-19 DIAGNOSIS — I10 Essential (primary) hypertension: Secondary | ICD-10-CM | POA: Insufficient documentation

## 2023-09-19 DIAGNOSIS — Z7984 Long term (current) use of oral hypoglycemic drugs: Secondary | ICD-10-CM | POA: Insufficient documentation

## 2023-09-19 DIAGNOSIS — G8929 Other chronic pain: Secondary | ICD-10-CM | POA: Insufficient documentation

## 2023-09-19 DIAGNOSIS — R519 Headache, unspecified: Secondary | ICD-10-CM | POA: Insufficient documentation

## 2023-09-19 DIAGNOSIS — Y92811 Bus as the place of occurrence of the external cause: Secondary | ICD-10-CM | POA: Insufficient documentation

## 2023-09-19 DIAGNOSIS — M542 Cervicalgia: Secondary | ICD-10-CM | POA: Insufficient documentation
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
## 2023-09-20 ENCOUNTER — Emergency Department (HOSPITAL_COMMUNITY)
Admission: EM | Admit: 2023-09-20 | Discharge: 2023-09-20 | Disposition: A | Discharge status: Home / Self Care | Source: Home / Self Care | Attending: Emergency Medicine | Admitting: Emergency Medicine

## 2023-09-20 ENCOUNTER — Encounter (HOSPITAL_COMMUNITY): Payer: Self-pay

## 2023-09-20 ENCOUNTER — Other Ambulatory Visit: Payer: Self-pay

## 2023-09-20 DIAGNOSIS — I10 Essential (primary) hypertension: Secondary | ICD-10-CM | POA: Insufficient documentation

## 2023-09-20 DIAGNOSIS — M542 Cervicalgia: Secondary | ICD-10-CM | POA: Diagnosis not present

## 2023-09-20 DIAGNOSIS — E119 Type 2 diabetes mellitus without complications: Secondary | ICD-10-CM | POA: Diagnosis not present

## 2023-09-20 DIAGNOSIS — M791 Myalgia, unspecified site: Secondary | ICD-10-CM | POA: Diagnosis present

## 2023-09-20 DIAGNOSIS — Z79899 Other long term (current) drug therapy: Secondary | ICD-10-CM | POA: Insufficient documentation

## 2023-09-20 DIAGNOSIS — Z7984 Long term (current) use of oral hypoglycemic drugs: Secondary | ICD-10-CM | POA: Diagnosis not present

## 2023-09-20 DIAGNOSIS — R600 Localized edema: Secondary | ICD-10-CM | POA: Diagnosis not present

## 2023-09-20 DIAGNOSIS — G8929 Other chronic pain: Secondary | ICD-10-CM | POA: Diagnosis present

## 2023-09-20 DIAGNOSIS — Z760 Encounter for issue of repeat prescription: Secondary | ICD-10-CM | POA: Insufficient documentation

## 2023-09-20 DIAGNOSIS — G894 Chronic pain syndrome: Secondary | ICD-10-CM | POA: Diagnosis not present

## 2023-09-20 DIAGNOSIS — R52 Pain, unspecified: Secondary | ICD-10-CM

## 2023-09-20 DIAGNOSIS — R519 Headache, unspecified: Secondary | ICD-10-CM | POA: Diagnosis not present

## 2023-09-20 DIAGNOSIS — W19XXXA Unspecified fall, initial encounter: Secondary | ICD-10-CM | POA: Diagnosis not present

## 2023-09-20 DIAGNOSIS — Y92811 Bus as the place of occurrence of the external cause: Secondary | ICD-10-CM | POA: Diagnosis not present

## 2023-09-20 MED ORDER — POLYVINYL ALCOHOL 1.4 % OP SOLN: 1.0000 [drp] | OPHTHALMIC | 0 refills | Status: DC | PRN

## 2023-09-20 MED ORDER — ACETAMINOPHEN 500 MG PO TABS
1000.0000 mg | ORAL_TABLET | Freq: Once | ORAL | Status: AC
  Administered 2023-09-20: 1000 mg via ORAL
  Filled 2023-09-20: qty 2
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
## 2023-09-20 NOTE — ED Notes (Addendum)
 Caught a bed bug from patient's bed.
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
## 2023-09-20 NOTE — Discharge Instructions (Signed)
 Eye drops sent to pharmacy Pick up and use as directed
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
## 2023-09-20 NOTE — ED Triage Notes (Signed)
 Pt reports that she is having generalized body aches that tylenol  isn't relieving.

## 2023-09-21 ENCOUNTER — Emergency Department (HOSPITAL_COMMUNITY)
Admission: EM | Admit: 2023-09-21 | Discharge: 2023-09-22 | Disposition: A | Discharge status: Home / Self Care | Attending: Emergency Medicine | Admitting: Emergency Medicine

## 2023-09-21 ENCOUNTER — Encounter (HOSPITAL_COMMUNITY): Payer: Self-pay | Admitting: *Deleted

## 2023-09-21 ENCOUNTER — Other Ambulatory Visit: Payer: Self-pay

## 2023-09-21 DIAGNOSIS — Z79899 Other long term (current) drug therapy: Secondary | ICD-10-CM | POA: Diagnosis not present

## 2023-09-21 DIAGNOSIS — Z8673 Personal history of transient ischemic attack (TIA), and cerebral infarction without residual deficits: Secondary | ICD-10-CM | POA: Diagnosis not present

## 2023-09-21 DIAGNOSIS — M791 Myalgia, unspecified site: Secondary | ICD-10-CM | POA: Insufficient documentation

## 2023-09-21 DIAGNOSIS — Z7984 Long term (current) use of oral hypoglycemic drugs: Secondary | ICD-10-CM | POA: Insufficient documentation

## 2023-09-21 DIAGNOSIS — R52 Pain, unspecified: Secondary | ICD-10-CM

## 2023-09-21 DIAGNOSIS — E119 Type 2 diabetes mellitus without complications: Secondary | ICD-10-CM | POA: Diagnosis not present

## 2023-09-21 DIAGNOSIS — I10 Essential (primary) hypertension: Secondary | ICD-10-CM | POA: Insufficient documentation
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
## 2023-09-22 ENCOUNTER — Other Ambulatory Visit: Payer: Self-pay

## 2023-09-22 ENCOUNTER — Emergency Department (HOSPITAL_COMMUNITY)
Admission: EM | Admit: 2023-09-22 | Discharge: 2023-09-22 | Disposition: A | Discharge status: Home / Self Care | Attending: Emergency Medicine | Admitting: Emergency Medicine

## 2023-09-22 DIAGNOSIS — W57XXXD Bitten or stung by nonvenomous insect and other nonvenomous arthropods, subsequent encounter: Secondary | ICD-10-CM | POA: Insufficient documentation

## 2023-09-22 DIAGNOSIS — Z7984 Long term (current) use of oral hypoglycemic drugs: Secondary | ICD-10-CM | POA: Insufficient documentation

## 2023-09-22 DIAGNOSIS — Z79899 Other long term (current) drug therapy: Secondary | ICD-10-CM | POA: Insufficient documentation

## 2023-09-22 DIAGNOSIS — S1086XD Insect bite of other specified part of neck, subsequent encounter: Secondary | ICD-10-CM | POA: Insufficient documentation

## 2023-09-22 DIAGNOSIS — G8929 Other chronic pain: Secondary | ICD-10-CM | POA: Insufficient documentation

## 2023-09-22 MED ORDER — HYDROCORTISONE 1 % EX CREA: TOPICAL_CREAM | CUTANEOUS | 0 refills | Status: DC

## 2023-09-22 MED ORDER — ACETAMINOPHEN 500 MG PO TABS
1000.0000 mg | ORAL_TABLET | Freq: Once | ORAL | Status: AC
  Administered 2023-09-22: 1000 mg via ORAL
  Filled 2023-09-22: qty 2

## 2023-09-22 NOTE — Discharge Instructions (Signed)
 Please follow-up with your PCP
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
## 2023-09-22 NOTE — Discharge Instructions (Signed)
 You were seen today for chronic bedbug bites and chronic pain.  If you wish to get rid of bedbugs, please be sure to wash and dry all your sheets with temperature over 140 F or freezing them at -4 F for at least 2 hours to kill bedbugs and the eggs.  Recommend that you call professional for official removal and/or throw away any mattresses, bed frames or furniture that are contaminated with the bugs.  You can use hydrocortisone  cream which I am providing to you to use for the itching.
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
## 2023-09-23 ENCOUNTER — Other Ambulatory Visit: Payer: Self-pay

## 2023-09-23 ENCOUNTER — Encounter (HOSPITAL_COMMUNITY): Payer: Self-pay | Admitting: Emergency Medicine

## 2023-09-23 ENCOUNTER — Emergency Department (HOSPITAL_COMMUNITY)
Admission: EM | Admit: 2023-09-23 | Discharge: 2023-09-24 | Disposition: A | Discharge status: Home / Self Care | Attending: Emergency Medicine | Admitting: Emergency Medicine

## 2023-09-23 DIAGNOSIS — G8929 Other chronic pain: Secondary | ICD-10-CM | POA: Diagnosis not present

## 2023-09-23 DIAGNOSIS — M791 Myalgia, unspecified site: Secondary | ICD-10-CM | POA: Insufficient documentation

## 2023-09-23 MED ORDER — ACETAMINOPHEN 500 MG PO TABS
1000.0000 mg | ORAL_TABLET | Freq: Once | ORAL | Status: AC
  Administered 2023-09-23: 1000 mg via ORAL
  Filled 2023-09-23: qty 2
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
## 2023-09-23 NOTE — ED Notes (Signed)
 Brought patient to triage one and as I did, I noticed 2 bedbugs on her soft collar. When we got in the room, this nurse removed them and put them in a specimen cup, then removed 2 more and 2 fell on the floor and I was unable to find them. Pt then starts yelling at this nurse stating she doesn't have bed bugs and if she does her daughter told her they came from [facility]. She then starts taking her hands and wiping across her clothing trying to wipe them off. More remain on her jacket that I am unable to remove. Pt was here yesterday as well and it was brought to her attention about the BB once again at that point when the PA picked one off of her jacket and showed her.  Charge nurse made aware.
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
## 2023-09-24 NOTE — ED Notes (Signed)
 Pt refused vital signs.

## 2023-09-26 ENCOUNTER — Emergency Department (HOSPITAL_COMMUNITY)
Admission: EM | Admit: 2023-09-26 | Discharge: 2023-09-27 | Disposition: A | Discharge status: Home / Self Care | Attending: Emergency Medicine | Admitting: Emergency Medicine

## 2023-09-26 DIAGNOSIS — M2550 Pain in unspecified joint: Secondary | ICD-10-CM | POA: Insufficient documentation

## 2023-09-26 DIAGNOSIS — M25531 Pain in right wrist: Secondary | ICD-10-CM | POA: Diagnosis present

## 2023-09-26 DIAGNOSIS — M791 Myalgia, unspecified site: Secondary | ICD-10-CM | POA: Diagnosis present

## 2023-09-26 DIAGNOSIS — G894 Chronic pain syndrome: Secondary | ICD-10-CM | POA: Diagnosis not present

## 2023-09-26 NOTE — ED Triage Notes (Signed)
 Pt here with c/o pain

## 2023-09-27 ENCOUNTER — Other Ambulatory Visit: Payer: Self-pay

## 2023-09-27 ENCOUNTER — Encounter (HOSPITAL_COMMUNITY): Payer: Self-pay

## 2023-09-27 ENCOUNTER — Emergency Department (HOSPITAL_COMMUNITY)
Admission: EM | Admit: 2023-09-27 | Discharge: 2023-09-27 | Disposition: A | Discharge status: Home / Self Care | Source: Home / Self Care | Attending: Emergency Medicine | Admitting: Emergency Medicine

## 2023-09-27 DIAGNOSIS — G894 Chronic pain syndrome: Secondary | ICD-10-CM | POA: Insufficient documentation

## 2023-09-27 MED ORDER — ACETAMINOPHEN 325 MG PO TABS
650.0000 mg | ORAL_TABLET | Freq: Once | ORAL | Status: AC
  Administered 2023-09-27: 650 mg via ORAL
  Filled 2023-09-27: qty 2

## 2023-09-27 MED ORDER — ACETAMINOPHEN 325 MG PO TABS
ORAL_TABLET | ORAL | Status: AC
  Filled 2023-09-27: qty 2

## 2023-09-27 MED ORDER — ACETAMINOPHEN 325 MG PO TABS
650.0000 mg | ORAL_TABLET | Freq: Once | ORAL | Status: AC
  Administered 2023-09-27: 650 mg via ORAL

## 2023-09-27 NOTE — ED Provider Notes (Signed)
 [place] EMERGENCY DEPARTMENT AT [facility] Provider Note   CSN: [id number] Arrival date & time: 09/26/23  1844     Patient presents with: Pain   [name] is a 78 y.o. female.   78 year old female presents with complaint of pain in her wrists, knees, ankles.  Chronic in nature.  Well-known to this department.       Prior to Admission medications   Medication Sig Start Date End Date Taking? Authorizing Provider  acetaminophen  (TYLENOL ) 500 MG tablet Take 1 tablet (500 mg total) by mouth every 6 (six) hours as needed. 08/02/23   Elnor Jayson LABOR, DO  albuterol  (VENTOLIN  HFA) 108 (90 Base) MCG/ACT inhaler Inhale 1-2 puffs into the lungs every 6 (six) hours as needed for wheezing or shortness of breath. 04/22/20   Myrna Camelia HERO, NP  amLODipine  (NORVASC ) 10 MG tablet Take 1 tablet (10 mg total) by mouth daily. 01/23/23   Roemhildt, Lorin T, PA-C  artificial tears ophthalmic solution Place 1 drop into both eyes as needed for dry eyes. 09/20/23   Jarold Olam HERO, PA-C  chlorthalidone  (HYGROTON ) 25 MG tablet TAKE 1 TABLET (25 MG TOTAL) BY MOUTH DAILY. 11/20/21 11/20/22  Paseda, Folashade R, FNP  cloNIDine  (CATAPRES ) 0.2 MG tablet Take 1 tablet (0.2 mg total) by mouth 2 (two) times daily. 08/26/23   Bauer, Collin S, PA-C  diclofenac  Sodium (VOLTAREN  ARTHRITIS PAIN) 1 % GEL Apply 2 g topically 3 (three) times daily as needed. 09/04/23   Kammerer, Megan L, DO  diclofenac  Sodium (VOLTAREN ) 1 % GEL Apply 4 g topically 4 (four) times daily. 08/15/23   [name], PA-C  diphenhydrAMINE  (BENADRYL ) 25 MG tablet Take 1 tablet (25 mg total) by mouth every 6 (six) hours as needed for itching or allergies. 10/03/22   [name], PA-C  gabapentin  (NEURONTIN ) 100 MG capsule Take 1 capsule (100 mg total) by mouth 3 (three) times daily as needed. 04/30/23   Bero, Michael M, MD  hydrocortisone  cream 1 % Apply to affected area 2 times daily 09/22/23   Bauer, Collin S, PA-C  hydrOXYzine  (ATARAX ) 25 MG  tablet Take 1 tablet (25 mg total) by mouth every 8 (eight) hours as needed. 11/23/22   Henderly, Britni A, PA-C  lidocaine  (LIDODERM ) 5 % Place 1 patch onto the skin daily. Remove & Discard patch within 12 hours or as directed by MD 08/02/23   Elnor Jayson A, DO  loratadine  (CLARITIN ) 10 MG tablet Take 1 tablet (10 mg total) by mouth daily. 04/06/23   Barrett, Warren SAILOR, PA-C  metFORMIN  (GLUCOPHAGE ) 500 MG tablet Take 1 tablet (500 mg total) by mouth 2 (two) times daily with a meal. 11/03/22 11/03/23  Lang Norleen POUR, PA-C  methocarbamol  (ROBAXIN ) 500 MG tablet Take 1 tablet (500 mg total) by mouth 2 (two) times daily. 03/19/23   Ula Prentice SAUNDERS, MD  metoprolol  succinate (TOPROL -XL) 25 MG 24 hr tablet Take 1 tablet (25 mg total) by mouth daily. 06/18/23   Melvenia Motto, MD  naproxen  (NAPROSYN ) 375 MG tablet Take 1 tablet (375 mg total) by mouth 2 (two) times daily as needed for moderate pain (pain score 4-6). 09/07/23   Sponseller, Pleasant R, PA-C  pantoprazole  (PROTONIX ) 20 MG tablet Take 1 tablet (20 mg total) by mouth daily. 10/10/21   Dean Clarity, MD  triamcinolone  cream (KENALOG ) 0.1 % Apply 1 Application topically 2 (two) times daily. 07/07/23   Vicky Charleston, PA-C    Allergies: Codeine, Ibuprofen , and Imdur [isosorbide nitrate]  Review of Systems Negative except as per HPI Updated Vital Signs BP (!) 210/74 (BP Location: Left Arm)   Pulse (!) 56   Temp 98.1 F (36.7 C)   Resp 20   SpO2 99%   Physical Exam Vitals and nursing note reviewed.  Constitutional:      General: She is not in acute distress.    Appearance: She is well-developed. She is not diaphoretic.  HENT:     Head: Normocephalic and atraumatic.  Pulmonary:     Effort: Pulmonary effort is normal.  Neurological:     Mental Status: She is alert and oriented to person, place, and time.  Psychiatric:        Behavior: Behavior normal.     (all labs ordered are listed, but only abnormal results are displayed) Labs  Reviewed - No data to display  EKG: None  Radiology: No results found.   Procedures   Medications Ordered in the ED  acetaminophen  (TYLENOL ) tablet 650 mg (has no administration in time range)                                    Medical Decision Making Risk OTC drugs.   78 year old female with polyarthralgia.  Plan is to provide Tylenol .  Recommend follow-up with [facility] and wellness.     Final diagnoses:  Polyarthralgia    ED Discharge Orders     None          Beverley Leita DELENA DEVONNA 09/27/23 9446    Palumbo, April, MD 09/27/23 [phone]

## 2023-09-27 NOTE — ED Provider Notes (Signed)
 [place] EMERGENCY DEPARTMENT AT [facility] Provider Note   CSN: [id number] Arrival date & time: 09/27/23  1720     Patient presents with: Pain   [name] is a 78 y.o. female.  Reporting to ER with complaint of body pain.  She is primarily localizing this to her bilateral hips and her neck.  She reports that this is chronic in nature.  She comes in almost daily to the department for this similar complaint.  Denies any new injury trauma or fall.   HPI     Prior to Admission medications   Medication Sig Start Date End Date Taking? Authorizing Provider  acetaminophen  (TYLENOL ) 500 MG tablet Take 1 tablet (500 mg total) by mouth every 6 (six) hours as needed. 08/02/23   Elnor Jayson LABOR, DO  albuterol  (VENTOLIN  HFA) 108 (90 Base) MCG/ACT inhaler Inhale 1-2 puffs into the lungs every 6 (six) hours as needed for wheezing or shortness of breath. 04/22/20   Myrna Camelia HERO, NP  amLODipine  (NORVASC ) 10 MG tablet Take 1 tablet (10 mg total) by mouth daily. 01/23/23   Roemhildt, Lorin T, PA-C  artificial tears ophthalmic solution Place 1 drop into both eyes as needed for dry eyes. 09/20/23   Jarold Olam HERO, PA-C  chlorthalidone  (HYGROTON ) 25 MG tablet TAKE 1 TABLET (25 MG TOTAL) BY MOUTH DAILY. 11/20/21 11/20/22  Paseda, Folashade R, FNP  cloNIDine  (CATAPRES ) 0.2 MG tablet Take 1 tablet (0.2 mg total) by mouth 2 (two) times daily. 08/26/23   Bauer, Collin S, PA-C  diclofenac  Sodium (VOLTAREN  ARTHRITIS PAIN) 1 % GEL Apply 2 g topically 3 (three) times daily as needed. 09/04/23   Kammerer, Megan L, DO  diclofenac  Sodium (VOLTAREN ) 1 % GEL Apply 4 g topically 4 (four) times daily. 08/15/23   [name], PA-C  diphenhydrAMINE  (BENADRYL ) 25 MG tablet Take 1 tablet (25 mg total) by mouth every 6 (six) hours as needed for itching or allergies. 10/03/22   [name], PA-C  gabapentin  (NEURONTIN ) 100 MG capsule Take 1 capsule (100 mg total) by mouth 3 (three) times daily as needed. 04/30/23   Theadore Ozell HERO, MD  hydrocortisone  cream 1 % Apply to affected area 2 times daily 09/22/23   Bauer, Collin S, PA-C  hydrOXYzine  (ATARAX ) 25 MG tablet Take 1 tablet (25 mg total) by mouth every 8 (eight) hours as needed. 11/23/22   Henderly, Britni A, PA-C  lidocaine  (LIDODERM ) 5 % Place 1 patch onto the skin daily. Remove & Discard patch within 12 hours or as directed by MD 08/02/23   Elnor Jayson A, DO  loratadine  (CLARITIN ) 10 MG tablet Take 1 tablet (10 mg total) by mouth daily. 04/06/23   Cynthie Garmon, Warren SAILOR, PA-C  metFORMIN  (GLUCOPHAGE ) 500 MG tablet Take 1 tablet (500 mg total) by mouth 2 (two) times daily with a meal. 11/03/22 11/03/23  Lang Norleen POUR, PA-C  methocarbamol  (ROBAXIN ) 500 MG tablet Take 1 tablet (500 mg total) by mouth 2 (two) times daily. 03/19/23   Ula Prentice SAUNDERS, MD  metoprolol  succinate (TOPROL -XL) 25 MG 24 hr tablet Take 1 tablet (25 mg total) by mouth daily. 06/18/23   Melvenia Motto, MD  naproxen  (NAPROSYN ) 375 MG tablet Take 1 tablet (375 mg total) by mouth 2 (two) times daily as needed for moderate pain (pain score 4-6). 09/07/23   Sponseller, Pleasant R, PA-C  pantoprazole  (PROTONIX ) 20 MG tablet Take 1 tablet (20 mg total) by mouth daily. 10/10/21   Dean Clarity, MD  triamcinolone  cream (KENALOG ) 0.1 % Apply 1 Application topically 2 (two) times daily. 07/07/23   Vicky Charleston, PA-C    Allergies: Codeine, Ibuprofen , and Imdur [isosorbide nitrate]    Review of Systems  Musculoskeletal:  Positive for arthralgias.    Updated Vital Signs BP (!) 175/53 (BP Location: Right Arm)   Pulse 62   Temp 97.7 F (36.5 C)   Resp 17   SpO2 98%   Physical Exam Vitals and nursing note reviewed.  Constitutional:      General: She is not in acute distress.    Appearance: She is not toxic-appearing.  HENT:     Head: Normocephalic and atraumatic.  Eyes:     General: No scleral icterus.    Conjunctiva/sclera: Conjunctivae normal.  Neck:     Comments: Wearing cervical soft  collar. Cardiovascular:     Rate and Rhythm: Normal rate and regular rhythm.     Pulses: Normal pulses.     Heart sounds: Normal heart sounds.  Pulmonary:     Effort: Pulmonary effort is normal. No respiratory distress.     Breath sounds: Normal breath sounds.  Abdominal:     General: Abdomen is flat. Bowel sounds are normal.     Palpations: Abdomen is soft.     Tenderness: There is no abdominal tenderness.  Skin:    General: Skin is warm and dry.     Findings: No lesion.  Neurological:     General: No focal deficit present.     Mental Status: She is alert and oriented to person, place, and time. Mental status is at baseline.     Comments: Ambulates with steady gait.     (all labs ordered are listed, but only abnormal results are displayed) Labs Reviewed - No data to display  EKG: None  Radiology: No results found.   Procedures   Medications Ordered in the ED - No data to display  Clinical Course as of 09/27/23 1757  Fri Sep 27, 2023  1747 Reports symptoms are at baseline, no acute complaints [LS]    Clinical Course User Index [LS] Rogelia Jerilynn RAMAN, MD                                 Medical Decision Making Risk OTC drugs.   This patient presents to the ED for concern of body aches, this involves an extensive number of treatment options, and is a complaint that carries with it a high risk of complications and morbidity.  The differential diagnosis includes fever, electrolyte abnormality, dehydration, fall   Problem List / ED Course / Critical interventions / Medication management  Patient presents emergency room with complaint of polyarthralgia.  Patient was provided Tylenol .  She will follow-up with community health and wellness.  Her vital signs are stable.  She is well-appearing.  She is acting at baseline.  We did discuss her elevated blood pressure.  She is requesting discharge with bus pass.         Final diagnoses:  Chronic pain syndrome     ED Discharge Orders     None          Rileigh Kawashima N, PA-C 09/27/23 2158    Rogelia Jerilynn RAMAN, MD 09/28/23 [phone]

## 2023-09-27 NOTE — ED Triage Notes (Signed)
Pt c/o body pain.

## 2023-09-29 ENCOUNTER — Emergency Department (HOSPITAL_COMMUNITY)
Admission: EM | Admit: 2023-09-29 | Discharge: 2023-09-30 | Disposition: A | Discharge status: Home / Self Care | Attending: Emergency Medicine | Admitting: Emergency Medicine

## 2023-09-29 ENCOUNTER — Encounter (HOSPITAL_COMMUNITY): Payer: Self-pay | Admitting: Emergency Medicine

## 2023-09-29 DIAGNOSIS — E119 Type 2 diabetes mellitus without complications: Secondary | ICD-10-CM | POA: Insufficient documentation

## 2023-09-29 DIAGNOSIS — G8929 Other chronic pain: Secondary | ICD-10-CM | POA: Diagnosis present

## 2023-09-29 DIAGNOSIS — I10 Essential (primary) hypertension: Secondary | ICD-10-CM | POA: Insufficient documentation

## 2023-09-29 DIAGNOSIS — Z7984 Long term (current) use of oral hypoglycemic drugs: Secondary | ICD-10-CM | POA: Diagnosis not present

## 2023-09-29 DIAGNOSIS — Z79899 Other long term (current) drug therapy: Secondary | ICD-10-CM | POA: Diagnosis not present
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
## 2023-09-30 MED ORDER — ACETAMINOPHEN 325 MG PO TABS
650.0000 mg | ORAL_TABLET | Freq: Once | ORAL | Status: AC
  Administered 2023-09-30: 650 mg via ORAL
  Filled 2023-09-30: qty 2
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
## 2023-10-01 ENCOUNTER — Emergency Department (HOSPITAL_COMMUNITY)
Admission: EM | Admit: 2023-10-01 | Discharge: 2023-10-01 | Disposition: A | Discharge status: Home / Self Care | Attending: Emergency Medicine | Admitting: Emergency Medicine

## 2023-10-01 ENCOUNTER — Encounter (HOSPITAL_COMMUNITY): Payer: Self-pay

## 2023-10-01 ENCOUNTER — Other Ambulatory Visit: Payer: Self-pay

## 2023-10-01 DIAGNOSIS — Z8673 Personal history of transient ischemic attack (TIA), and cerebral infarction without residual deficits: Secondary | ICD-10-CM | POA: Diagnosis not present

## 2023-10-01 DIAGNOSIS — I10 Essential (primary) hypertension: Secondary | ICD-10-CM | POA: Diagnosis not present

## 2023-10-01 DIAGNOSIS — G8929 Other chronic pain: Secondary | ICD-10-CM | POA: Insufficient documentation

## 2023-10-01 DIAGNOSIS — Z7984 Long term (current) use of oral hypoglycemic drugs: Secondary | ICD-10-CM | POA: Insufficient documentation

## 2023-10-01 DIAGNOSIS — M791 Myalgia, unspecified site: Secondary | ICD-10-CM | POA: Insufficient documentation

## 2023-10-01 DIAGNOSIS — E119 Type 2 diabetes mellitus without complications: Secondary | ICD-10-CM | POA: Insufficient documentation

## 2023-10-01 DIAGNOSIS — M7918 Myalgia, other site: Secondary | ICD-10-CM | POA: Insufficient documentation

## 2023-10-01 DIAGNOSIS — Z79899 Other long term (current) drug therapy: Secondary | ICD-10-CM | POA: Insufficient documentation

## 2023-10-01 DIAGNOSIS — R52 Pain, unspecified: Secondary | ICD-10-CM

## 2023-10-01 DIAGNOSIS — Z765 Malingerer [conscious simulation]: Secondary | ICD-10-CM | POA: Diagnosis present

## 2023-10-01 MED ORDER — ACETAMINOPHEN 325 MG PO TABS
650.0000 mg | ORAL_TABLET | Freq: Once | ORAL | Status: AC
  Administered 2023-10-01: 650 mg via ORAL
  Filled 2023-10-01: qty 2
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
## 2023-10-01 NOTE — Discharge Instructions (Addendum)
 Please use Tylenol or ibuprofen for pain.  You may use 600 mg ibuprofen every 6 hours or 1000 mg of Tylenol every 6 hours.  You may choose to alternate between the 2.  This would be most effective.  Not to exceed 4 g of Tylenol within 24 hours.  Not to exceed 3200 mg ibuprofen 24 hours.

## 2023-10-01 NOTE — ED Triage Notes (Addendum)
 Pt insisted on checking back in to ER after just being d/c'd to get pain medication for body aches.

## 2023-10-01 NOTE — ED Provider Notes (Signed)
 [place] EMERGENCY DEPARTMENT AT [facility] Provider Note   CSN: [id number] Arrival date & time: 10/01/23  2048     Patient presents with: Body Aches   [name] is [name] 78 y.o. female with past medical history significant for hypertension, diabetes, chronic headache, cervical stenosis, cognitive impairment who is well-known to the emergency department with extremely frequent visits for body aches who presents with concern for persistent body aches.  She has some visible bedbugs, which she has [name] known history of.  She reports no change since she was evaluated within the last 24 hours.   HPI     Prior to Admission medications   Medication Sig Start Date End Date Taking? Authorizing Provider  acetaminophen  (TYLENOL ) 500 MG tablet Take 1 tablet (500 mg total) by mouth every 6 (six) hours as needed. 08/02/23   [name] LABOR, [name]  albuterol  (VENTOLIN  HFA) 108 (90 Base) MCG/ACT inhaler Inhale 1-2 puffs into the lungs every 6 (six) hours as needed for wheezing or shortness of breath. 04/22/20   Adriana Camelia HERO, NP  amLODipine  (NORVASC ) 10 MG tablet Take 1 tablet (10 mg total) by mouth daily. 01/23/23   Roemhildt, Lorin [name], [name]  artificial tears ophthalmic solution Place 1 drop into both eyes as needed for dry eyes. 09/20/23   Adriana Olam HERO, [name]  chlorthalidone  (HYGROTON ) 25 MG tablet TAKE 1 TABLET (25 MG TOTAL) BY MOUTH DAILY. 11/20/21 11/20/22  Paseda, Folashade R, FNP  cloNIDine  (CATAPRES ) 0.2 MG tablet Take 1 tablet (0.2 mg total) by mouth 2 (two) times daily. 08/26/23   Bauer, [name], [name]  diclofenac  Sodium (VOLTAREN  ARTHRITIS PAIN) 1 % GEL Apply 2 g topically 3 (three) times daily as needed. 09/04/23   Kammerer, Megan L, [name]  diclofenac  Sodium (VOLTAREN ) 1 % GEL Apply 4 g topically 4 (four) times daily. 08/15/23   Adriana Colon, [name]  diphenhydrAMINE  (BENADRYL ) 25 MG tablet Take 1 tablet (25 mg total) by mouth every 6 (six) hours as needed for itching or allergies. 10/03/22   Adriana Colon, [name]  gabapentin  (NEURONTIN ) 100 MG capsule Take 1 capsule (100 mg total) by mouth 3 (three) times daily as needed. 04/30/23   [name], Adriana M, MD  hydrocortisone  cream 1 % Apply to affected area 2 times daily 09/22/23   Bauer, [name], [name]  hydrOXYzine  (ATARAX ) 25 MG tablet Take 1 tablet (25 mg total) by mouth every 8 (eight) hours as needed. 11/23/22   Henderly, [name], [name]  lidocaine  (LIDODERM ) 5 % Place 1 patch onto the skin daily. Remove & Discard patch within 12 hours or as directed by MD 08/02/23   [name]  loratadine  (CLARITIN ) 10 MG tablet Take 1 tablet (10 mg total) by mouth daily. 04/06/23   Barrett, Adriana SAILOR, [name]  metFORMIN  (GLUCOPHAGE ) 500 MG tablet Take 1 tablet (500 mg total) by mouth 2 (two) times daily with [name] meal. 11/03/22 11/03/23  Lang Adriana POUR, [name]  methocarbamol  (ROBAXIN ) 500 MG tablet Take 1 tablet (500 mg total) by mouth 2 (two) times daily. 03/19/23   Adriana Prentice SAUNDERS, MD  metoprolol  succinate (TOPROL -XL) 25 MG 24 hr tablet Take 1 tablet (25 mg total) by mouth daily. 06/18/23   Adriana Motto, MD  naproxen  (NAPROSYN ) 375 MG tablet Take 1 tablet (375 mg total) by mouth 2 (two) times daily as needed for moderate pain (pain score 4-6). 09/07/23   Sponseller, Adriana SAUNDERS, [name]  pantoprazole  (PROTONIX ) 20 MG tablet Take 1 tablet (  20 mg total) by mouth daily. 10/10/21   Adriana Clarity, MD  triamcinolone  cream (KENALOG ) 0.1 % Apply 1 Application topically 2 (two) times daily. 07/07/23   [name]    Allergies: Codeine, Ibuprofen , and Imdur [isosorbide nitrate]    Review of Systems  All other systems reviewed and are negative.   Updated Vital Signs BP (!) 160/67 (BP Location: Right Arm)   Pulse (!) 58   Temp 98.1 F (36.7 C)   Resp 16   SpO2 100%   Physical Exam Vitals and nursing note reviewed.  Constitutional:      General: She is not in acute distress.    Appearance: Normal appearance.  HENT:     Head: Normocephalic and atraumatic.  Eyes:      General:        Right eye: No discharge.        Left eye: No discharge.  Cardiovascular:     Rate and Rhythm: Normal rate and regular rhythm.     Heart sounds: No murmur heard.    No friction rub. No gallop.  Pulmonary:     Effort: Pulmonary effort is normal.     Breath sounds: Normal breath sounds.  Abdominal:     General: Bowel sounds are normal.     Palpations: Abdomen is soft.  Musculoskeletal:     Comments: Moving all 4 limbs spontaneously, no distress  Skin:    General: Skin is warm and dry.     Capillary Refill: Capillary refill takes less than 2 seconds.     Comments: Bedbugs noted on patient'[name] chronic neck brace  Neurological:     Mental Status: She is alert and oriented to person, place, and time.  Psychiatric:        Mood and Affect: Mood normal.        Behavior: Behavior normal.     (all labs ordered are listed, but only abnormal results are displayed) Labs Reviewed - No data to display  EKG: None  Radiology: No results found.   Procedures   Medications Ordered in the ED  acetaminophen  (TYLENOL ) tablet 650 mg (650 mg Oral Given 10/01/23 2123)                                    Medical Decision Making Risk OTC drugs.   This is [name] well-appearing 78 year old female who presents to the emergency department with ongoing body aches.  She is seen frequently for the same.  Her vital signs are stable other than mild hypertension, blood pressure 160/67.  She is neurovascularly intact throughout, moving all 4 limbs spontaneously.  No change in her complaints or physical exam presentation since her last evaluation.  Notably she has been seen twice in the last 24 hours.  Tylenol  administered for body aches, [name] not have any additional recommendations at this time.  Tried to counsel patient on appropriate use of the emergency department.  Discharged in stable condition, return precautions given.   Final diagnoses:  Malingering  Body aches    ED Discharge  Orders     None          [name] 10/01/23 2124    Adriana Elsie CROME, MD 10/01/23 [phone]

## 2023-10-01 NOTE — Discharge Instructions (Addendum)
 Follow up with a primary care doctor, as previously recommended.

## 2023-10-01 NOTE — ED Triage Notes (Signed)
 PT here for pain that has been going on for years and she refuses to follow up with a PCP or family dr. PT states this is her PCP. PT called EMS for body pains PT was able to walk to wheelchair.

## 2023-10-01 NOTE — ED Triage Notes (Signed)
 Patient reports persistent body aches , ambulatory , respirations unlabored. Visible bed bugs crawling on patient's clothes/collar .
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
## 2023-10-03 ENCOUNTER — Encounter (HOSPITAL_COMMUNITY): Payer: Self-pay | Admitting: Radiology

## 2023-10-03 ENCOUNTER — Other Ambulatory Visit: Payer: Self-pay

## 2023-10-03 ENCOUNTER — Emergency Department (HOSPITAL_COMMUNITY)
Admission: EM | Admit: 2023-10-03 | Discharge: 2023-10-04 | Discharge status: Against Medical Advice | Attending: Emergency Medicine | Admitting: Emergency Medicine

## 2023-10-03 DIAGNOSIS — R52 Pain, unspecified: Secondary | ICD-10-CM | POA: Insufficient documentation

## 2023-10-03 DIAGNOSIS — Z5321 Procedure and treatment not carried out due to patient leaving prior to being seen by health care provider: Secondary | ICD-10-CM | POA: Diagnosis not present

## 2023-10-03 NOTE — ED Triage Notes (Signed)
 Pt here for complaints of body aches from a fall that happened 2-3 years ago.

## 2023-10-04 NOTE — ED Notes (Signed)
 [name] decided to go home

## 2023-10-06 ENCOUNTER — Other Ambulatory Visit: Payer: Self-pay

## 2023-10-06 ENCOUNTER — Emergency Department (HOSPITAL_COMMUNITY)
Admission: EM | Admit: 2023-10-06 | Discharge: 2023-10-06 | Disposition: A | Discharge status: Home / Self Care | Attending: Emergency Medicine | Admitting: Emergency Medicine

## 2023-10-06 ENCOUNTER — Encounter (HOSPITAL_COMMUNITY): Payer: Self-pay | Admitting: Radiology

## 2023-10-06 DIAGNOSIS — Z7984 Long term (current) use of oral hypoglycemic drugs: Secondary | ICD-10-CM | POA: Diagnosis not present

## 2023-10-06 DIAGNOSIS — Z79899 Other long term (current) drug therapy: Secondary | ICD-10-CM | POA: Diagnosis not present

## 2023-10-06 DIAGNOSIS — E119 Type 2 diabetes mellitus without complications: Secondary | ICD-10-CM | POA: Insufficient documentation

## 2023-10-06 DIAGNOSIS — G894 Chronic pain syndrome: Secondary | ICD-10-CM | POA: Diagnosis present

## 2023-10-06 DIAGNOSIS — I10 Essential (primary) hypertension: Secondary | ICD-10-CM | POA: Diagnosis not present

## 2023-10-06 MED ORDER — CLONIDINE HCL 0.2 MG PO TABS
0.2000 mg | ORAL_TABLET | Freq: Once | ORAL | Status: AC
  Administered 2023-10-06: 0.2 mg via ORAL
  Filled 2023-10-06: qty 1

## 2023-10-06 MED ORDER — ACETAMINOPHEN 500 MG PO TABS
1000.0000 mg | ORAL_TABLET | Freq: Once | ORAL | Status: AC
  Administered 2023-10-06: 1000 mg via ORAL
  Filled 2023-10-06: qty 2

## 2023-10-06 MED ORDER — LIDOCAINE 5 % EX PTCH
1.0000 | MEDICATED_PATCH | CUTANEOUS | Status: DC
  Administered 2023-10-06: 1 via TRANSDERMAL
  Filled 2023-10-06: qty 1

## 2023-10-06 MED ORDER — AMLODIPINE BESYLATE 5 MG PO TABS
10.0000 mg | ORAL_TABLET | Freq: Once | ORAL | Status: AC
  Administered 2023-10-06: 10 mg via ORAL
  Filled 2023-10-06: qty 2

## 2023-10-06 MED ORDER — KETOROLAC TROMETHAMINE 15 MG/ML IJ SOLN
15.0000 mg | Freq: Once | INTRAMUSCULAR | Status: AC
  Administered 2023-10-06: 15 mg via INTRAMUSCULAR
  Filled 2023-10-06: qty 1
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
## 2023-10-06 NOTE — ED Notes (Signed)
 Pt's systolic BP was over 200. MD made aware. Clonidine  and amlodipine  ordered.

## 2023-10-06 NOTE — ED Triage Notes (Signed)
 Pt here today with complaints of generalized body aches from a fall that happens years ago. She endorses Tylenol  isn't working for her pain and she needs something stronger.

## 2023-10-06 NOTE — Discharge Instructions (Addendum)
 Follow-up with your primary care provider.  Seek emergency care if experiencing any new or worsening symptoms.

## 2023-10-08 ENCOUNTER — Encounter (HOSPITAL_COMMUNITY): Payer: Self-pay | Admitting: *Deleted

## 2023-10-08 ENCOUNTER — Emergency Department (HOSPITAL_COMMUNITY)
Admission: EM | Admit: 2023-10-08 | Discharge: 2023-10-09 | Discharge status: Against Medical Advice | Source: Home / Self Care

## 2023-10-08 ENCOUNTER — Other Ambulatory Visit: Payer: Self-pay

## 2023-10-08 ENCOUNTER — Emergency Department (HOSPITAL_COMMUNITY)
Admission: EM | Admit: 2023-10-08 | Discharge: 2023-10-08 | Disposition: A | Discharge status: Home / Self Care | Attending: Emergency Medicine | Admitting: Emergency Medicine

## 2023-10-08 DIAGNOSIS — R52 Pain, unspecified: Secondary | ICD-10-CM | POA: Diagnosis present

## 2023-10-08 DIAGNOSIS — Z5321 Procedure and treatment not carried out due to patient leaving prior to being seen by health care provider: Secondary | ICD-10-CM | POA: Insufficient documentation

## 2023-10-08 DIAGNOSIS — W19XXXA Unspecified fall, initial encounter: Secondary | ICD-10-CM | POA: Insufficient documentation

## 2023-10-08 DIAGNOSIS — M130 Polyarthritis, unspecified: Secondary | ICD-10-CM | POA: Insufficient documentation

## 2023-10-08 DIAGNOSIS — G8929 Other chronic pain: Secondary | ICD-10-CM

## 2023-10-08 DIAGNOSIS — M791 Myalgia, unspecified site: Secondary | ICD-10-CM | POA: Diagnosis present

## 2023-10-08 MED ORDER — ACETAMINOPHEN 500 MG PO TABS
1000.0000 mg | ORAL_TABLET | Freq: Once | ORAL | Status: AC
  Administered 2023-10-08: 1000 mg via ORAL
  Filled 2023-10-08: qty 2
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
## 2023-10-08 NOTE — Discharge Instructions (Signed)
Please follow up with family doctor.
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
## 2023-10-08 NOTE — ED Triage Notes (Signed)
 Pt is here for body aches after she fell a long time ago.  Ambulatory.  Fortunately no acute distress.

## 2023-10-09 ENCOUNTER — Emergency Department (HOSPITAL_COMMUNITY)
Admission: EM | Admit: 2023-10-09 | Discharge: 2023-10-09 | Disposition: A | Discharge status: Home / Self Care | Attending: Emergency Medicine | Admitting: Emergency Medicine

## 2023-10-09 ENCOUNTER — Encounter (HOSPITAL_COMMUNITY): Payer: Self-pay

## 2023-10-09 DIAGNOSIS — M791 Myalgia, unspecified site: Secondary | ICD-10-CM | POA: Insufficient documentation

## 2023-10-09 DIAGNOSIS — R52 Pain, unspecified: Secondary | ICD-10-CM

## 2023-10-09 MED ORDER — PREDNISONE 20 MG PO TABS
60.0000 mg | ORAL_TABLET | Freq: Once | ORAL | Status: AC
  Administered 2023-10-09: 60 mg via ORAL
  Filled 2023-10-09: qty 3

## 2023-10-09 NOTE — ED Triage Notes (Signed)
 Pt reports generalized body aches ongoing for months. Pt ambulatory in triage, no signs of distress noted.

## 2023-10-09 NOTE — ED Notes (Signed)
 Patient left AMA.
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
## 2023-10-10 ENCOUNTER — Encounter (HOSPITAL_COMMUNITY): Payer: Self-pay

## 2023-10-10 ENCOUNTER — Emergency Department (HOSPITAL_COMMUNITY): Admission: EM | Admit: 2023-10-10 | Discharge: 2023-10-10 | Disposition: A | Discharge status: Home / Self Care

## 2023-10-10 ENCOUNTER — Other Ambulatory Visit: Payer: Self-pay

## 2023-10-10 DIAGNOSIS — M791 Myalgia, unspecified site: Secondary | ICD-10-CM | POA: Insufficient documentation

## 2023-10-10 MED ORDER — ACETAMINOPHEN 325 MG PO TABS: 650.0000 mg | ORAL_TABLET | Freq: Once | ORAL | Status: DC

## 2023-10-10 MED ORDER — METHOCARBAMOL 500 MG PO TABS
500.0000 mg | ORAL_TABLET | Freq: Once | ORAL | Status: DC
  Filled 2023-10-10: qty 1

## 2023-10-10 MED ORDER — METHOCARBAMOL 500 MG PO TABS
500.0000 mg | ORAL_TABLET | Freq: Once | ORAL | Status: AC
Start: 2023-10-10 — End: 2023-10-10
  Administered 2023-10-10: 500 mg via ORAL
  Filled 2023-10-10: qty 1

## 2023-10-10 NOTE — ED Triage Notes (Signed)
 Patient reports her whole body hurts including her eyes. Patient observed to have bed bug on her left lower abdomen.
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
## 2023-10-10 NOTE — Discharge Instructions (Signed)
 Is encouraged to follow-up with her primary care for further evaluation and management of chronic pain.  If pain worsens or new symptoms arise please return to ED for further evaluation.
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
## 2023-10-11 ENCOUNTER — Encounter (HOSPITAL_COMMUNITY): Payer: Self-pay

## 2023-10-11 ENCOUNTER — Emergency Department (HOSPITAL_COMMUNITY)
Admission: EM | Admit: 2023-10-11 | Discharge: 2023-10-12 | Disposition: A | Discharge status: Home / Self Care | Attending: Emergency Medicine | Admitting: Emergency Medicine

## 2023-10-11 ENCOUNTER — Other Ambulatory Visit: Payer: Self-pay

## 2023-10-11 DIAGNOSIS — I10 Essential (primary) hypertension: Secondary | ICD-10-CM | POA: Diagnosis not present

## 2023-10-11 DIAGNOSIS — Z79899 Other long term (current) drug therapy: Secondary | ICD-10-CM | POA: Insufficient documentation

## 2023-10-11 DIAGNOSIS — M542 Cervicalgia: Secondary | ICD-10-CM | POA: Diagnosis present

## 2023-10-11 NOTE — ED Triage Notes (Signed)
 Pt here for generalized pain. Pt arrives in soft collar that she has worn for years.
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
## 2023-10-12 DIAGNOSIS — I10 Essential (primary) hypertension: Secondary | ICD-10-CM | POA: Diagnosis not present

## 2023-10-12 MED ORDER — CLONIDINE HCL 0.2 MG PO TABS
0.2000 mg | ORAL_TABLET | Freq: Once | ORAL | Status: AC
  Administered 2023-10-12: 0.2 mg via ORAL
  Filled 2023-10-12: qty 1
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
## 2023-10-12 NOTE — Discharge Instructions (Addendum)
 Return if any problems.

## 2023-10-14 ENCOUNTER — Emergency Department (HOSPITAL_COMMUNITY): Admission: EM | Admit: 2023-10-14 | Discharge: 2023-10-14 | Disposition: A | Discharge status: Home / Self Care

## 2023-10-14 ENCOUNTER — Encounter (HOSPITAL_COMMUNITY): Payer: Self-pay

## 2023-10-14 ENCOUNTER — Other Ambulatory Visit: Payer: Self-pay

## 2023-10-14 DIAGNOSIS — M791 Myalgia, unspecified site: Secondary | ICD-10-CM | POA: Insufficient documentation

## 2023-10-14 DIAGNOSIS — I10 Essential (primary) hypertension: Secondary | ICD-10-CM | POA: Diagnosis not present

## 2023-10-14 DIAGNOSIS — Y92410 Unspecified street and highway as the place of occurrence of the external cause: Secondary | ICD-10-CM | POA: Diagnosis not present

## 2023-10-14 DIAGNOSIS — Z79899 Other long term (current) drug therapy: Secondary | ICD-10-CM | POA: Insufficient documentation

## 2023-10-14 DIAGNOSIS — R52 Pain, unspecified: Secondary | ICD-10-CM

## 2023-10-14 MED ORDER — ACETAMINOPHEN 325 MG PO TABS
650.0000 mg | ORAL_TABLET | Freq: Once | ORAL | Status: AC
Start: 2023-10-14 — End: 2023-10-14
  Administered 2023-10-14: 650 mg via ORAL
  Filled 2023-10-14: qty 2

## 2023-10-14 MED ORDER — LIDOCAINE 5 % EX PTCH
1.0000 | MEDICATED_PATCH | CUTANEOUS | Status: DC
  Administered 2023-10-14: 1 via TRANSDERMAL
  Filled 2023-10-14: qty 1

## 2023-10-14 MED ORDER — AMLODIPINE BESYLATE 5 MG PO TABS
10.0000 mg | ORAL_TABLET | Freq: Once | ORAL | Status: AC
  Administered 2023-10-14: 10 mg via ORAL
  Filled 2023-10-14: qty 2

## 2023-10-14 MED ORDER — CLONIDINE HCL 0.2 MG PO TABS
0.2000 mg | ORAL_TABLET | Freq: Once | ORAL | Status: AC
  Administered 2023-10-14: 0.2 mg via ORAL
  Filled 2023-10-14: qty 1

## 2023-10-14 NOTE — ED Triage Notes (Signed)
 Patient c/o body pain, 10/10, reports everything else is the same.

## 2023-10-14 NOTE — ED Notes (Signed)
 Pt provided w/ d/c paperwork, all questions answered. Pt wheeled to ED lobby w/ all belongings
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
## 2023-10-14 NOTE — Discharge Instructions (Signed)
 Please take all your blood pressure medications.  For pain, you can take 1000 mg of Tylenol  or 1 g of Tylenol  every 6-8 hours.  Do not exceed more than 4000 mg or 4 g in a 24-hour period.  Follow up PCP.
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
## 2023-10-15 ENCOUNTER — Encounter (HOSPITAL_COMMUNITY): Payer: Self-pay | Admitting: Emergency Medicine

## 2023-10-15 ENCOUNTER — Emergency Department (HOSPITAL_COMMUNITY)
Admission: EM | Admit: 2023-10-15 | Discharge: 2023-10-15 | Disposition: A | Discharge status: Home / Self Care | Attending: Emergency Medicine | Admitting: Emergency Medicine

## 2023-10-15 DIAGNOSIS — M130 Polyarthritis, unspecified: Secondary | ICD-10-CM | POA: Diagnosis not present

## 2023-10-15 DIAGNOSIS — M791 Myalgia, unspecified site: Secondary | ICD-10-CM | POA: Diagnosis present

## 2023-10-15 DIAGNOSIS — M2550 Pain in unspecified joint: Secondary | ICD-10-CM

## 2023-10-15 MED ORDER — MULTIVITAMIN WOMEN 50+ PO TABS: 1.0000 | ORAL_TABLET | Freq: Every day | ORAL | 6 refills | Status: DC

## 2023-10-15 MED ORDER — ACETAMINOPHEN 325 MG PO TABS
650.0000 mg | ORAL_TABLET | Freq: Once | ORAL | Status: AC
  Administered 2023-10-15: 650 mg via ORAL
  Filled 2023-10-15: qty 2

## 2023-10-15 MED ORDER — ADULT MULTIVITAMIN W/MINERALS CH
1.0000 | ORAL_TABLET | Freq: Once | ORAL | Status: AC
  Administered 2023-10-15: 1 via ORAL
  Filled 2023-10-15: qty 1
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
## 2023-10-15 NOTE — ED Triage Notes (Signed)
 Patient just discharged, now here for eye drops.

## 2023-10-17 ENCOUNTER — Emergency Department (HOSPITAL_COMMUNITY)
Admission: EM | Admit: 2023-10-17 | Discharge: 2023-10-18 | Disposition: A | Discharge status: Home / Self Care | Attending: Emergency Medicine | Admitting: Emergency Medicine

## 2023-10-17 DIAGNOSIS — M791 Myalgia, unspecified site: Secondary | ICD-10-CM | POA: Insufficient documentation

## 2023-10-17 DIAGNOSIS — G8929 Other chronic pain: Secondary | ICD-10-CM | POA: Diagnosis not present

## 2023-10-17 DIAGNOSIS — Z79899 Other long term (current) drug therapy: Secondary | ICD-10-CM | POA: Diagnosis not present

## 2023-10-17 DIAGNOSIS — Z7984 Long term (current) use of oral hypoglycemic drugs: Secondary | ICD-10-CM | POA: Insufficient documentation

## 2023-10-17 NOTE — ED Triage Notes (Signed)
 Pt states honey I need something stronger than Tylenol 

## 2023-10-18 MED ORDER — ACETAMINOPHEN 500 MG PO TABS
1000.0000 mg | ORAL_TABLET | Freq: Once | ORAL | Status: AC
  Administered 2023-10-18: 1000 mg via ORAL
  Filled 2023-10-18: qty 2
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
## 2023-10-20 ENCOUNTER — Encounter (HOSPITAL_COMMUNITY): Payer: Self-pay | Admitting: *Deleted

## 2023-10-20 ENCOUNTER — Other Ambulatory Visit: Payer: Self-pay

## 2023-10-20 ENCOUNTER — Emergency Department (HOSPITAL_COMMUNITY)
Admission: EM | Admit: 2023-10-20 | Discharge: 2023-10-20 | Disposition: A | Discharge status: Home / Self Care | Attending: Emergency Medicine | Admitting: Emergency Medicine

## 2023-10-20 DIAGNOSIS — G8929 Other chronic pain: Secondary | ICD-10-CM | POA: Insufficient documentation

## 2023-10-20 DIAGNOSIS — R519 Headache, unspecified: Secondary | ICD-10-CM | POA: Diagnosis present

## 2023-10-20 MED ORDER — NAPROXEN 250 MG PO TABS
375.0000 mg | ORAL_TABLET | Freq: Once | ORAL | Status: AC
  Administered 2023-10-20: 375 mg via ORAL
  Filled 2023-10-20: qty 2
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
## 2023-10-20 NOTE — ED Provider Notes (Signed)
  EMERGENCY DEPARTMENT AT [facility] Provider Note   CSN: [id number] Arrival date & time: 10/20/23  1513     Patient presents with: Headache   [name] is a 78 y.o. female who presents to the ED for generalized bodyaches consistent with previous visits.  Similar complaints to previous visit from 25 September.  States that she has been taking Tylenol  without any improvement in her body pain, pain is chronic from previous injury.  Denies any new injury or trauma.  Has been seen in the ED multiple times in the last 6 months for similar type pain and symptoms arising from similar etiology.    Headache      Prior to Admission medications   Medication Sig Start Date End Date Taking? Authorizing Provider  acetaminophen  (TYLENOL ) 500 MG tablet Take 1 tablet (500 mg total) by mouth every 6 (six) hours as needed. 08/02/23   Elnor Jayson LABOR, DO  albuterol  (VENTOLIN  HFA) 108 (90 Base) MCG/ACT inhaler Inhale 1-2 puffs into the lungs every 6 (six) hours as needed for wheezing or shortness of breath. 04/22/20   Myrna Camelia HERO, NP  amLODipine  (NORVASC ) 10 MG tablet Take 1 tablet (10 mg total) by mouth daily. 01/23/23   Roemhildt, Lorin T, PA-C  artificial tears ophthalmic solution Place 1 drop into both eyes as needed for dry eyes. 09/20/23   Jarold Olam HERO, PA-C  chlorthalidone  (HYGROTON ) 25 MG tablet TAKE 1 TABLET (25 MG TOTAL) BY MOUTH DAILY. 11/20/21 11/20/22  Paseda, Folashade R, FNP  cloNIDine  (CATAPRES ) 0.2 MG tablet Take 1 tablet (0.2 mg total) by mouth 2 (two) times daily. 08/26/23   Bauer, Collin S, PA-C  diclofenac  Sodium (VOLTAREN  ARTHRITIS PAIN) 1 % GEL Apply 2 g topically 3 (three) times daily as needed. 09/04/23   Kammerer, Megan L, DO  diclofenac  Sodium (VOLTAREN ) 1 % GEL Apply 4 g topically 4 (four) times daily. 08/15/23   [name], PA-C  diphenhydrAMINE  (BENADRYL ) 25 MG tablet Take 1 tablet (25 mg total) by mouth every 6 (six) hours as needed for itching or  allergies. 10/03/22   [name], PA-C  gabapentin  (NEURONTIN ) 100 MG capsule Take 1 capsule (100 mg total) by mouth 3 (three) times daily as needed. 04/30/23   Bero, Michael M, MD  hydrocortisone  cream 1 % Apply to affected area 2 times daily 09/22/23   Bauer, Collin S, PA-C  hydrOXYzine  (ATARAX ) 25 MG tablet Take 1 tablet (25 mg total) by mouth every 8 (eight) hours as needed. 11/23/22   Henderly, Britni A, PA-C  lidocaine  (LIDODERM ) 5 % Place 1 patch onto the skin daily. Remove & Discard patch within 12 hours or as directed by MD 08/02/23   Elnor Jayson A, DO  loratadine  (CLARITIN ) 10 MG tablet Take 1 tablet (10 mg total) by mouth daily. 04/06/23   Barrett, Jamie N, PA-C  metFORMIN  (GLUCOPHAGE ) 500 MG tablet Take 1 tablet (500 mg total) by mouth 2 (two) times daily with a meal. 11/03/22 11/03/23  Lang Norleen POUR, PA-C  methocarbamol  (ROBAXIN ) 500 MG tablet Take 1 tablet (500 mg total) by mouth 2 (two) times daily. 03/19/23   Ula Prentice SAUNDERS, MD  metoprolol  succinate (TOPROL -XL) 25 MG 24 hr tablet Take 1 tablet (25 mg total) by mouth daily. 06/18/23   Melvenia Motto, MD  Multiple Vitamins-Minerals (MULTIVITAMIN WOMEN 50+) TABS Take 1 tablet by mouth daily. 10/15/23   Beverley Leita LABOR, PA-C  naproxen  (NAPROSYN ) 375 MG tablet Take 1 tablet (375 mg  total) by mouth 2 (two) times daily as needed for moderate pain (pain score 4-6). 09/07/23   Sponseller, Pleasant R, PA-C  pantoprazole  (PROTONIX ) 20 MG tablet Take 1 tablet (20 mg total) by mouth daily. 10/10/21   Dean Clarity, MD  triamcinolone  cream (KENALOG ) 0.1 % Apply 1 Application topically 2 (two) times daily. 07/07/23   Vicky Charleston, PA-C    Allergies: Codeine, Ibuprofen , and Imdur [isosorbide nitrate]    Review of Systems  Musculoskeletal:  Positive for arthralgias.  Neurological:  Positive for headaches.  All other systems reviewed and are negative.   Updated Vital Signs BP (!) 158/55   Pulse (!) 58   Resp 16   Ht 5' 4 (1.626 m)   Wt 55 kg    SpO2 100%   BMI 20.81 kg/m   Physical Exam Vitals and nursing note reviewed.  Constitutional:      General: She is not in acute distress.    Appearance: She is well-developed.  HENT:     Head: Normocephalic and atraumatic.  Eyes:     Extraocular Movements: Extraocular movements intact.     Conjunctiva/sclera: Conjunctivae normal.     Pupils: Pupils are equal, round, and reactive to light.  Cardiovascular:     Rate and Rhythm: Normal rate and regular rhythm.     Heart sounds: No murmur heard. Pulmonary:     Effort: Pulmonary effort is normal. No respiratory distress.     Breath sounds: Normal breath sounds.  Abdominal:     Palpations: Abdomen is soft.     Tenderness: There is no abdominal tenderness.  Musculoskeletal:        General: No swelling.     Cervical back: Normal range of motion and neck supple.  Skin:    General: Skin is warm and dry.     Capillary Refill: Capillary refill takes less than 2 seconds.  Neurological:     Mental Status: She is alert and oriented to person, place, and time. Mental status is at baseline.     GCS: GCS eye subscore is 4. GCS verbal subscore is 5. GCS motor subscore is 6.  Psychiatric:        Mood and Affect: Mood normal.        Speech: Speech normal.        Behavior: Behavior normal.     (all labs ordered are listed, but only abnormal results are displayed) Labs Reviewed - No data to display  EKG: None  Radiology: No results found.   Procedures   Medications Ordered in the ED - No data to display                                  Medical Decision Making  Physical exam of this patient is unremarkable, given her multiple previous visits for similar type pain, will manage her pain with naproxen  as she has tolerated this well in the past.  Last administration documented and August 2025 with no adverse effects documented.  Sent in prescription for same.  His vital signs within normal limits and stable for this patient, she has no  new concerning signs or symptoms, will discharge patient with outpatient follow-up.     Final diagnoses:  None    ED Discharge Orders     None          [name] BROCKS, [place] 10/20/23 1619    Melvenia Motto, MD 10/20/23 2034

## 2023-10-21 ENCOUNTER — Other Ambulatory Visit: Payer: Self-pay

## 2023-10-21 ENCOUNTER — Emergency Department (HOSPITAL_COMMUNITY): Admission: EM | Admit: 2023-10-21 | Discharge: 2023-10-21 | Disposition: A | Discharge status: Home / Self Care

## 2023-10-21 ENCOUNTER — Encounter (HOSPITAL_COMMUNITY): Payer: Self-pay

## 2023-10-21 ENCOUNTER — Emergency Department (HOSPITAL_COMMUNITY)
Admission: EM | Admit: 2023-10-21 | Discharge: 2023-10-21 | Disposition: A | Discharge status: Home / Self Care | Source: Home / Self Care

## 2023-10-21 DIAGNOSIS — I10 Essential (primary) hypertension: Secondary | ICD-10-CM | POA: Insufficient documentation

## 2023-10-21 DIAGNOSIS — M549 Dorsalgia, unspecified: Secondary | ICD-10-CM | POA: Diagnosis present

## 2023-10-21 DIAGNOSIS — Z79899 Other long term (current) drug therapy: Secondary | ICD-10-CM | POA: Insufficient documentation

## 2023-10-21 DIAGNOSIS — G8929 Other chronic pain: Secondary | ICD-10-CM | POA: Insufficient documentation

## 2023-10-21 DIAGNOSIS — M791 Myalgia, unspecified site: Secondary | ICD-10-CM | POA: Diagnosis present

## 2023-10-21 MED ORDER — ACETAMINOPHEN 325 MG PO TABS
650.0000 mg | ORAL_TABLET | Freq: Once | ORAL | Status: AC
  Administered 2023-10-21: 650 mg via ORAL
  Filled 2023-10-21: qty 2

## 2023-10-21 MED ORDER — CLONIDINE HCL 0.2 MG PO TABS
0.2000 mg | ORAL_TABLET | Freq: Once | ORAL | Status: AC
  Administered 2023-10-21: 0.2 mg via ORAL
  Filled 2023-10-21: qty 1

## 2023-10-21 NOTE — ED Provider Triage Note (Signed)
 Emergency Medicine Provider Triage Evaluation Note  [name] , a 78 y.o. female  was evaluated in triage.  Pt complains of ongoing chronic pain.  Patient checked back in after being seen earlier today by myself.  She was given Tylenol , clonidine .  Blood pressure has improved a bit.  She is unable to verbalize exactly what she is hoping for today.  Review of Systems  Positive: Body pain Negative:   Physical Exam  BP (!) 178/88   Pulse 62   Temp 97.9 F (36.6 C)   Resp 18   Ht 5' 4 (1.626 m)   Wt 55 kg   SpO2 99%   BMI 20.81 kg/m  Gen:   Awake, no distress   Resp:  Normal effort  MSK:   Moves extremities without difficulty  Other:    Medical Decision Making  Medically screening exam initiated at 8:15 PM.  Appropriate orders placed.  [name] was informed that the remainder of the evaluation will be completed by another provider, this initial triage assessment does not replace that evaluation, and the importance of remaining in the ED until their evaluation is complete.  BP improved from earlier.  Patient at baseline.   Adriana Chew, PA-C 10/21/23 2016

## 2023-10-21 NOTE — Discharge Instructions (Addendum)
 Continue taking over-the-counter medications as directed on the packaging for your chronic pain and follow-up with your doctor.

## 2023-10-21 NOTE — ED Provider Notes (Signed)
 [place] EMERGENCY DEPARTMENT AT [facility] Provider Note   CSN: [id number] Arrival date & time: 10/21/23  1547     Patient presents with: Generalized Body Aches   Joya Willmott is a 78 y.o. female.   Patient presents to the emergency department today for evaluation of ongoing chronic body pain.  She states that her joints feel stiff.  Typically she takes Tylenol  but this is not strong enough.  She has only taken her chronic medications today, no Tylenol .  Denies other complaints.       Prior to Admission medications   Medication Sig Start Date End Date Taking? Authorizing Provider  acetaminophen  (TYLENOL ) 500 MG tablet Take 1 tablet (500 mg total) by mouth every 6 (six) hours as needed. 08/02/23   Elnor Jayson LABOR, DO  albuterol  (VENTOLIN  HFA) 108 (90 Base) MCG/ACT inhaler Inhale 1-2 puffs into the lungs every 6 (six) hours as needed for wheezing or shortness of breath. 04/22/20   Myrna Camelia HERO, NP  amLODipine  (NORVASC ) 10 MG tablet Take 1 tablet (10 mg total) by mouth daily. 01/23/23   Roemhildt, Lorin T, PA-C  artificial tears ophthalmic solution Place 1 drop into both eyes as needed for dry eyes. 09/20/23   Jarold Olam HERO, PA-C  chlorthalidone  (HYGROTON ) 25 MG tablet TAKE 1 TABLET (25 MG TOTAL) BY MOUTH DAILY. 11/20/21 11/20/22  Paseda, Folashade R, FNP  cloNIDine  (CATAPRES ) 0.2 MG tablet Take 1 tablet (0.2 mg total) by mouth 2 (two) times daily. 08/26/23   Bauer, Collin S, PA-C  diclofenac  Sodium (VOLTAREN  ARTHRITIS PAIN) 1 % GEL Apply 2 g topically 3 (three) times daily as needed. 09/04/23   Kammerer, Megan L, DO  diclofenac  Sodium (VOLTAREN ) 1 % GEL Apply 4 g topically 4 (four) times daily. 08/15/23   [name], PA-C  diphenhydrAMINE  (BENADRYL ) 25 MG tablet Take 1 tablet (25 mg total) by mouth every 6 (six) hours as needed for itching or allergies. 10/03/22   [name], PA-C  gabapentin  (NEURONTIN ) 100 MG capsule Take 1 capsule (100 mg total) by mouth 3 (three) times daily  as needed. 04/30/23   Bero, Michael M, MD  hydrocortisone  cream 1 % Apply to affected area 2 times daily 09/22/23   Bauer, Collin S, PA-C  hydrOXYzine  (ATARAX ) 25 MG tablet Take 1 tablet (25 mg total) by mouth every 8 (eight) hours as needed. 11/23/22   Henderly, Britni A, PA-C  lidocaine  (LIDODERM ) 5 % Place 1 patch onto the skin daily. Remove & Discard patch within 12 hours or as directed by MD 08/02/23   Elnor Jayson A, DO  loratadine  (CLARITIN ) 10 MG tablet Take 1 tablet (10 mg total) by mouth daily. 04/06/23   Barrett, Jamie N, PA-C  metFORMIN  (GLUCOPHAGE ) 500 MG tablet Take 1 tablet (500 mg total) by mouth 2 (two) times daily with a meal. 11/03/22 11/03/23  Lang Norleen POUR, PA-C  methocarbamol  (ROBAXIN ) 500 MG tablet Take 1 tablet (500 mg total) by mouth 2 (two) times daily. 03/19/23   Ula Prentice SAUNDERS, MD  metoprolol  succinate (TOPROL -XL) 25 MG 24 hr tablet Take 1 tablet (25 mg total) by mouth daily. 06/18/23   Melvenia Motto, MD  Multiple Vitamins-Minerals (MULTIVITAMIN WOMEN 50+) TABS Take 1 tablet by mouth daily. 10/15/23   Beverley Leita LABOR, PA-C  naproxen  (NAPROSYN ) 375 MG tablet Take 1 tablet (375 mg total) by mouth 2 (two) times daily as needed for moderate pain (pain score 4-6). 09/07/23   Sponseller, Rebekah R, PA-C  pantoprazole  (  PROTONIX ) 20 MG tablet Take 1 tablet (20 mg total) by mouth daily. 10/10/21   Dean Clarity, MD  triamcinolone  cream (KENALOG ) 0.1 % Apply 1 Application topically 2 (two) times daily. 07/07/23   Vicky Charleston, PA-C    Allergies: Codeine, Ibuprofen , and Imdur [isosorbide nitrate]    Review of Systems  Updated Vital Signs BP (!) 224/74 (BP Location: Right Arm)   Pulse (!) 54   Temp 98.2 F (36.8 C)   Resp 18   SpO2 99%   Physical Exam Vitals and nursing note reviewed.  Constitutional:      Appearance: She is well-developed.  HENT:     Head: Normocephalic and atraumatic.  Eyes:     Conjunctiva/sclera: Conjunctivae normal.  Pulmonary:     Effort: No  respiratory distress.  Musculoskeletal:     Cervical back: Normal range of motion and neck supple.     Comments: Moves extremities without difficulty.  Skin:    General: Skin is warm and dry.  Neurological:     Mental Status: She is alert.     Comments: At baseline.     (all labs ordered are listed, but only abnormal results are displayed) Labs Reviewed - No data to display  EKG: None  Radiology: No results found.   Procedures   Medications Ordered in the ED  acetaminophen  (TYLENOL ) tablet 650 mg (has no administration in time range)  cloNIDine  (CATAPRES ) tablet 0.2 mg (0.2 mg Oral Given 10/21/23 1620)   ED Course  Patient seen and examined. History obtained directly from patient.  Reviewed several past ED visits.  Labs/EKG: None ordered  Imaging: None ordered  Medications/Fluids: Ordered: P.o. clonidine  0.2 mg, p.o. acetaminophen  650 mg.  Most recent vital signs reviewed and are as follows: BP (!) 224/74 (BP Location: Right Arm)   Pulse (!) 54   Temp 98.2 F (36.8 C)   Resp 18   SpO2 99%   Initial impression: Chronic pain, hypertension  Home treatment plan: Continued home medications  Return instructions discussed with patient: No worsening symptoms  Follow-up instructions discussed with patient: PCP                                   Medical Decision Making Risk OTC drugs. Prescription drug management.   Patient now with 125 ED visits over the past 6 months.  She is often here for chronic pain.  She has generalized joint pain today.  She looks well.  She appears to be at baseline.  BP is elevated, very similar to previous.  She is on oral medications.  She was given a dose of clonidine  while here.  No concern for endorgan damage at this time.     Final diagnoses:  Other chronic pain  Primary hypertension    ED Discharge Orders     None          Desiderio Chew, PA-C 10/21/23 1624    Ula Prentice SAUNDERS, MD 10/21/23 2352

## 2023-10-21 NOTE — ED Triage Notes (Signed)
 Pt complains of full body pain from [place]. Pt states tylenol  is not helping. Pt wearing soft neck brace. Pt ambulated into ER without difficulty

## 2023-10-21 NOTE — ED Triage Notes (Signed)
 PT states she is having generalized body pain, PT has been seen today already and been seen 125 times in 6 months for the same complaint. PT walked in under own power. PT states the tylenol  didn't help. Provider has cleared her today as well as the day before.

## 2023-10-21 NOTE — Discharge Instructions (Signed)
 Continue your home medications.  Follow-up with your doctor.  Your blood pressure has improved.

## 2023-10-21 NOTE — ED Provider Notes (Signed)
 [place] EMERGENCY DEPARTMENT AT [facility] Provider Note   CSN: [id number] Arrival date & time: 10/21/23  1943     Patient presents with: Back Pain   [name] is a 78 y.o. female.   Pt complains of ongoing chronic pain.  Patient checked back in after being seen earlier today by myself.  She was given Tylenol , clonidine .  Blood pressure has improved a bit.  She is unable to verbalize exactly what she is hoping for today.       Prior to Admission medications   Medication Sig Start Date End Date Taking? Authorizing Provider  acetaminophen  (TYLENOL ) 500 MG tablet Take 1 tablet (500 mg total) by mouth every 6 (six) hours as needed. 08/02/23   Elnor Jayson LABOR, DO  albuterol  (VENTOLIN  HFA) 108 (90 Base) MCG/ACT inhaler Inhale 1-2 puffs into the lungs every 6 (six) hours as needed for wheezing or shortness of breath. 04/22/20   Myrna Camelia HERO, NP  amLODipine  (NORVASC ) 10 MG tablet Take 1 tablet (10 mg total) by mouth daily. 01/23/23   Roemhildt, Lorin T, PA-C  artificial tears ophthalmic solution Place 1 drop into both eyes as needed for dry eyes. 09/20/23   Jarold Olam HERO, PA-C  chlorthalidone  (HYGROTON ) 25 MG tablet TAKE 1 TABLET (25 MG TOTAL) BY MOUTH DAILY. 11/20/21 11/20/22  Paseda, Folashade R, FNP  cloNIDine  (CATAPRES ) 0.2 MG tablet Take 1 tablet (0.2 mg total) by mouth 2 (two) times daily. 08/26/23   Bauer, Collin S, PA-C  diclofenac  Sodium (VOLTAREN  ARTHRITIS PAIN) 1 % GEL Apply 2 g topically 3 (three) times daily as needed. 09/04/23   Kammerer, Megan L, DO  diclofenac  Sodium (VOLTAREN ) 1 % GEL Apply 4 g topically 4 (four) times daily. 08/15/23   [name], PA-C  diphenhydrAMINE  (BENADRYL ) 25 MG tablet Take 1 tablet (25 mg total) by mouth every 6 (six) hours as needed for itching or allergies. 10/03/22   [name], PA-C  gabapentin  (NEURONTIN ) 100 MG capsule Take 1 capsule (100 mg total) by mouth 3 (three) times daily as needed. 04/30/23   Bero, Michael M, MD   hydrocortisone  cream 1 % Apply to affected area 2 times daily 09/22/23   Bauer, Collin S, PA-C  hydrOXYzine  (ATARAX ) 25 MG tablet Take 1 tablet (25 mg total) by mouth every 8 (eight) hours as needed. 11/23/22   Henderly, Britni A, PA-C  lidocaine  (LIDODERM ) 5 % Place 1 patch onto the skin daily. Remove & Discard patch within 12 hours or as directed by MD 08/02/23   Elnor Jayson A, DO  loratadine  (CLARITIN ) 10 MG tablet Take 1 tablet (10 mg total) by mouth daily. 04/06/23   Barrett, Warren SAILOR, PA-C  metFORMIN  (GLUCOPHAGE ) 500 MG tablet Take 1 tablet (500 mg total) by mouth 2 (two) times daily with a meal. 11/03/22 11/03/23  Lang Norleen POUR, PA-C  methocarbamol  (ROBAXIN ) 500 MG tablet Take 1 tablet (500 mg total) by mouth 2 (two) times daily. 03/19/23   Ula Prentice SAUNDERS, MD  metoprolol  succinate (TOPROL -XL) 25 MG 24 hr tablet Take 1 tablet (25 mg total) by mouth daily. 06/18/23   Melvenia Motto, MD  Multiple Vitamins-Minerals (MULTIVITAMIN WOMEN 50+) TABS Take 1 tablet by mouth daily. 10/15/23   Beverley Leita LABOR, PA-C  naproxen  (NAPROSYN ) 375 MG tablet Take 1 tablet (375 mg total) by mouth 2 (two) times daily as needed for moderate pain (pain score 4-6). 09/07/23   Sponseller, Rebekah R, PA-C  pantoprazole  (PROTONIX ) 20 MG tablet Take  1 tablet (20 mg total) by mouth daily. 10/10/21   Dean Clarity, MD  triamcinolone  cream (KENALOG ) 0.1 % Apply 1 Application topically 2 (two) times daily. 07/07/23   Vicky Charleston, PA-C    Allergies: Codeine, Ibuprofen , and Imdur [isosorbide nitrate]    Review of Systems  Updated Vital Signs BP (!) 178/88   Pulse 62   Temp 97.9 F (36.6 C)   Resp 18   Ht 5' 4 (1.626 m)   Wt 55 kg   SpO2 99%   BMI 20.81 kg/m   Physical Exam Vitals and nursing note reviewed.  Constitutional:      Appearance: She is well-developed.  HENT:     Head: Normocephalic and atraumatic.  Eyes:     Conjunctiva/sclera: Conjunctivae normal.  Pulmonary:     Effort: No respiratory distress.   Musculoskeletal:     Cervical back: Normal range of motion and neck supple.  Skin:    General: Skin is warm and dry.  Neurological:     Mental Status: She is alert.     (all labs ordered are listed, but only abnormal results are displayed) Labs Reviewed - No data to display  EKG: None  Radiology: No results found.   Procedures   Medications Ordered in the ED - No data to display  ED Course  Patient seen and examined. History obtained directly from patient.   Labs/EKG: None ordered  Imaging: None ordered  Medications/Fluids: None ordered  Most recent vital signs reviewed and are as follows: BP (!) 178/88   Pulse 62   Temp 97.9 F (36.6 C)   Resp 18   Ht 5' 4 (1.626 m)   Wt 55 kg   SpO2 99%   BMI 20.81 kg/m   Initial impression: Chronic pain  Home treatment plan: Home medications  Return instructions discussed with patient: Return with worsening  Follow-up instructions discussed with patient: PCP                                   Medical Decision Making  Patient stable.  Second emergency department visit today.  Blood pressure improved from earlier.       Final diagnoses:  Other chronic pain    ED Discharge Orders     None          Desiderio Chew, PA-C 10/21/23 2100    Ula Prentice SAUNDERS, MD 10/21/23 [phone]
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
## 2023-10-22 ENCOUNTER — Other Ambulatory Visit: Payer: Self-pay

## 2023-10-22 ENCOUNTER — Emergency Department (HOSPITAL_COMMUNITY): Admission: EM | Admit: 2023-10-22 | Discharge: 2023-10-22 | Disposition: A | Discharge status: Home / Self Care

## 2023-10-22 ENCOUNTER — Encounter (HOSPITAL_COMMUNITY): Payer: Self-pay | Admitting: *Deleted

## 2023-10-22 DIAGNOSIS — G894 Chronic pain syndrome: Secondary | ICD-10-CM | POA: Diagnosis not present

## 2023-10-22 DIAGNOSIS — Z7984 Long term (current) use of oral hypoglycemic drugs: Secondary | ICD-10-CM | POA: Insufficient documentation

## 2023-10-22 DIAGNOSIS — I1A0 Resistant hypertension: Secondary | ICD-10-CM | POA: Diagnosis not present

## 2023-10-22 DIAGNOSIS — Z79899 Other long term (current) drug therapy: Secondary | ICD-10-CM | POA: Insufficient documentation

## 2023-10-22 DIAGNOSIS — I10 Essential (primary) hypertension: Secondary | ICD-10-CM | POA: Insufficient documentation

## 2023-10-22 DIAGNOSIS — E119 Type 2 diabetes mellitus without complications: Secondary | ICD-10-CM | POA: Diagnosis not present

## 2023-10-22 DIAGNOSIS — M542 Cervicalgia: Secondary | ICD-10-CM | POA: Diagnosis present

## 2023-10-22 MED ORDER — CLONIDINE HCL 0.2 MG PO TABS
0.2000 mg | ORAL_TABLET | Freq: Once | ORAL | Status: AC
  Administered 2023-10-22: 0.2 mg via ORAL
  Filled 2023-10-22: qty 1

## 2023-10-22 MED ORDER — AMLODIPINE BESYLATE 5 MG PO TABS
10.0000 mg | ORAL_TABLET | Freq: Once | ORAL | Status: AC
  Administered 2023-10-22: 10 mg via ORAL
  Filled 2023-10-22: qty 2
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
## 2023-10-22 NOTE — ED Triage Notes (Signed)
 Pt c/o generalized pain, states tylenol  is not helping

## 2023-10-22 NOTE — ED Notes (Signed)
 Pt stated she wanted to use the bathroom in the lobby, refused the one in triage. Pt in lobby.
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
## 2023-10-24 ENCOUNTER — Other Ambulatory Visit: Payer: Self-pay

## 2023-10-24 ENCOUNTER — Emergency Department (HOSPITAL_COMMUNITY)
Admission: EM | Admit: 2023-10-24 | Discharge: 2023-10-24 | Disposition: A | Discharge status: Home / Self Care | Attending: Emergency Medicine | Admitting: Emergency Medicine

## 2023-10-24 ENCOUNTER — Emergency Department (HOSPITAL_COMMUNITY)

## 2023-10-24 DIAGNOSIS — M25561 Pain in right knee: Secondary | ICD-10-CM | POA: Insufficient documentation

## 2023-10-24 DIAGNOSIS — W19XXXA Unspecified fall, initial encounter: Secondary | ICD-10-CM

## 2023-10-24 DIAGNOSIS — W010XXA Fall on same level from slipping, tripping and stumbling without subsequent striking against object, initial encounter: Secondary | ICD-10-CM | POA: Insufficient documentation

## 2023-10-24 DIAGNOSIS — M791 Myalgia, unspecified site: Secondary | ICD-10-CM | POA: Insufficient documentation

## 2023-10-24 DIAGNOSIS — M25562 Pain in left knee: Secondary | ICD-10-CM | POA: Diagnosis not present

## 2023-10-24 MED ORDER — NAPROXEN 250 MG PO TABS
500.0000 mg | ORAL_TABLET | Freq: Once | ORAL | Status: AC
  Administered 2023-10-24: 500 mg via ORAL
  Filled 2023-10-24: qty 2

## 2023-10-24 NOTE — ED Provider Notes (Signed)
 [place] EMERGENCY DEPARTMENT AT [facility] Provider Note   CSN: [id number] Arrival date & time: 10/24/23  1750     Patient presents with: Fall and Generalized Body Pain   [name] is a 78 y.o. female who states that she had a mechanical fall earlier today, tripped onto a rock fell knee first onto a concrete surface.  Complains of bilateral knee pain, and has complaints of generalized bodyaches that has been chronic.  Nursing staff appreciated multiple small insects on this patient upon presentation.  She denies head impact, denies loss of consciousness, declines examination of the head and scalp secondary to her Being on not wanting it to be removed.    Fall       Prior to Admission medications   Medication Sig Start Date End Date Taking? Authorizing Provider  acetaminophen  (TYLENOL ) 500 MG tablet Take 1 tablet (500 mg total) by mouth every 6 (six) hours as needed. 08/02/23   Elnor Jayson LABOR, DO  albuterol  (VENTOLIN  HFA) 108 (90 Base) MCG/ACT inhaler Inhale 1-2 puffs into the lungs every 6 (six) hours as needed for wheezing or shortness of breath. 04/22/20   Myrna Camelia HERO, NP  amLODipine  (NORVASC ) 10 MG tablet Take 1 tablet (10 mg total) by mouth daily. 01/23/23   Roemhildt, Lorin T, PA-C  artificial tears ophthalmic solution Place 1 drop into both eyes as needed for dry eyes. 09/20/23   Jarold Olam HERO, PA-C  chlorthalidone  (HYGROTON ) 25 MG tablet TAKE 1 TABLET (25 MG TOTAL) BY MOUTH DAILY. 11/20/21 11/20/22  Paseda, Folashade R, FNP  cloNIDine  (CATAPRES ) 0.2 MG tablet Take 1 tablet (0.2 mg total) by mouth 2 (two) times daily. 08/26/23   Bauer, Collin S, PA-C  diclofenac  Sodium (VOLTAREN  ARTHRITIS PAIN) 1 % GEL Apply 2 g topically 3 (three) times daily as needed. 09/04/23   Kammerer, Megan L, DO  diclofenac  Sodium (VOLTAREN ) 1 % GEL Apply 4 g topically 4 (four) times daily. 08/15/23   [name], PA-C  diphenhydrAMINE  (BENADRYL ) 25 MG tablet Take 1 tablet (25 mg total) by  mouth every 6 (six) hours as needed for itching or allergies. 10/03/22   [name], PA-C  gabapentin  (NEURONTIN ) 100 MG capsule Take 1 capsule (100 mg total) by mouth 3 (three) times daily as needed. 04/30/23   Bero, Michael M, MD  hydrocortisone  cream 1 % Apply to affected area 2 times daily 09/22/23   Bauer, Collin S, PA-C  hydrOXYzine  (ATARAX ) 25 MG tablet Take 1 tablet (25 mg total) by mouth every 8 (eight) hours as needed. 11/23/22   Henderly, Britni A, PA-C  lidocaine  (LIDODERM ) 5 % Place 1 patch onto the skin daily. Remove & Discard patch within 12 hours or as directed by MD 08/02/23   Elnor Jayson LABOR, DO  loratadine  (CLARITIN ) 10 MG tablet Take 1 tablet (10 mg total) by mouth daily. 04/06/23   Barrett, Warren SAILOR, PA-C  metFORMIN  (GLUCOPHAGE ) 500 MG tablet Take 1 tablet (500 mg total) by mouth 2 (two) times daily with a meal. 11/03/22 11/03/23  Lang Norleen POUR, PA-C  methocarbamol  (ROBAXIN ) 500 MG tablet Take 1 tablet (500 mg total) by mouth 2 (two) times daily. 03/19/23   Ula Prentice SAUNDERS, MD  metoprolol  succinate (TOPROL -XL) 25 MG 24 hr tablet Take 1 tablet (25 mg total) by mouth daily. 06/18/23   Melvenia Motto, MD  Multiple Vitamins-Minerals (MULTIVITAMIN WOMEN 50+) TABS Take 1 tablet by mouth daily. 10/15/23   Beverley Leita LABOR, PA-C  naproxen  (NAPROSYN )  375 MG tablet Take 1 tablet (375 mg total) by mouth 2 (two) times daily as needed for moderate pain (pain score 4-6). 09/07/23   Sponseller, Pleasant R, PA-C  pantoprazole  (PROTONIX ) 20 MG tablet Take 1 tablet (20 mg total) by mouth daily. 10/10/21   Dean Clarity, MD  triamcinolone  cream (KENALOG ) 0.1 % Apply 1 Application topically 2 (two) times daily. 07/07/23   Vicky Charleston, PA-C    Allergies: Codeine, Ibuprofen , and Imdur [isosorbide nitrate]    Review of Systems  Musculoskeletal:  Positive for arthralgias.  All other systems reviewed and are negative.   Updated Vital Signs BP (!) 157/57 (BP Location: Right Arm)   Pulse 62   Temp (!) 97.5  F (36.4 C) (Oral)   Resp 18   SpO2 100%   Physical Exam Vitals and nursing note reviewed.  Constitutional:      General: She is not in acute distress.    Appearance: She is well-developed.  HENT:     Head: Normocephalic and atraumatic.  Eyes:     Conjunctiva/sclera: Conjunctivae normal.  Cardiovascular:     Rate and Rhythm: Normal rate and regular rhythm.     Heart sounds: No murmur heard. Pulmonary:     Effort: Pulmonary effort is normal. No respiratory distress.     Breath sounds: Normal breath sounds.  Abdominal:     Palpations: Abdomen is soft.     Tenderness: There is no abdominal tenderness.  Musculoskeletal:        General: No swelling.     Cervical back: Neck supple.     Right knee: Tenderness present.     Left knee: Tenderness present.     Comments: Tenderness to the anterior knee at the joint line.  No gross deformities noted.  Skin:    General: Skin is warm and dry.     Capillary Refill: Capillary refill takes less than 2 seconds.  Neurological:     Mental Status: She is alert.  Psychiatric:        Mood and Affect: Mood normal.     (all labs ordered are listed, but only abnormal results are displayed) Labs Reviewed - No data to display  EKG: None  Radiology: DG Knee Left Port Result Date: 10/24/2023 EXAM: 1 OR 2 VIEW(S) XRAY OF THE KNEE 10/24/2023 08:18:00 PM COMPARISON: None available. CLINICAL HISTORY: Knee pain and tenderness. Reason for exam: pain and tenderness; Best obtainable images, multiply attempts made FINDINGS: BONES AND JOINTS: No acute fracture. No focal osseous lesion. No joint dislocation. No significant joint effusion. No significant degenerative changes. SOFT TISSUES: The soft tissues are unremarkable. IMPRESSION: 1. No acute abnormalities. Electronically signed by: Norman Gatlin MD 10/24/2023 08:29 PM EDT RP Workstation: [id number]   DG Knee Right Port Result Date: 10/24/2023 EXAM: 1 or 2 VIEW(S) XRAY OF THE KNEE 10/24/2023 08:18:00 PM  COMPARISON: 02/13/2023 CLINICAL HISTORY: Knee pain and tenderness. Reason for exam: pain and tenderness; Reason for exam: pain and tenderness; Best obtainable images, multiply attempts made. FINDINGS: BONES AND JOINTS: No acute fracture. No focal osseous lesion. No joint dislocation. No significant joint effusion. No significant degenerative changes. SOFT TISSUES: The soft tissues are unremarkable. IMPRESSION: 1. No acute abnormalities. Electronically signed by: Norman Gatlin MD 10/24/2023 08:28 PM EDT RP Workstation: [id number]     Procedures   Medications Ordered in the ED  naproxen  (NAPROSYN ) tablet 500 mg (has no administration in time range)  Medical Decision Making Amount and/or Complexity of Data Reviewed Radiology: ordered.   Imaging obtained of the bilateral knees does not show any signs of acute fracture.  Exam of the head was limited as patient did not allow for proper exposure, however she denies striking her head during the fall.  There is no midline tenderness to the spine throughout the entirety of the spine, and the neck, while she is wearing a soft c-collar, when removed is supple and has not full range of motion without any elicited pain.  There is some tenderness to the anterior knees, however the knee joint is stable on exam.  Differential did include potential fracture to the knees however with imaging obtained this is less than likely.  Pain is consistent with her chronic pain, we will manage this with oral NSAIDs.  No other pertinent exam findings found, plan at this time is to discharge patient with previously prescribed pain management plan.     Final diagnoses:  Acute pain of both knees  Fall, initial encounter    ED Discharge Orders     None          [name] BROCKS, [place] 10/24/23 2036    Ruthe Cornet, DO 10/24/23 2039
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
## 2023-10-25 ENCOUNTER — Emergency Department (HOSPITAL_COMMUNITY)
Admission: EM | Admit: 2023-10-25 | Discharge: 2023-10-25 | Disposition: A | Discharge status: Home / Self Care | Attending: Emergency Medicine | Admitting: Emergency Medicine

## 2023-10-25 ENCOUNTER — Encounter (HOSPITAL_COMMUNITY): Payer: Self-pay | Admitting: *Deleted

## 2023-10-25 ENCOUNTER — Other Ambulatory Visit: Payer: Self-pay

## 2023-10-25 DIAGNOSIS — M791 Myalgia, unspecified site: Secondary | ICD-10-CM | POA: Insufficient documentation

## 2023-10-25 DIAGNOSIS — R52 Pain, unspecified: Secondary | ICD-10-CM

## 2023-10-25 MED ORDER — NAPROXEN 250 MG PO TABS
500.0000 mg | ORAL_TABLET | Freq: Once | ORAL | Status: AC
  Administered 2023-10-25: 500 mg via ORAL
  Filled 2023-10-25: qty 2

## 2023-10-25 NOTE — Discharge Instructions (Signed)
 Please follow-up with your primary care provider for continued management of your hypertension, and also follow-up with the previously provided referral for ophthalmology for further evaluation of your eye condition.

## 2023-10-25 NOTE — ED Provider Notes (Signed)
 [place] EMERGENCY DEPARTMENT AT [facility] Provider Note   CSN: [id number] Arrival date & time: 10/25/23  1817     History Chief Complaint  Patient presents with   Leg Pain     Leg Pain  [name] is a 78 y.o. female presenting for generalized bodyaches, chronic pain.  Denies have any recent falls or injuries, denies having any other new symptoms at this time..   Patient's recorded medical, surgical, social, medication list and allergies were reviewed in the Snapshot window as part of the initial history.   Review of Systems   Review of Systems  Musculoskeletal:  Positive for arthralgias.  All other systems reviewed and are negative.   Physical Exam Updated Vital Signs BP (!) 180/78 (BP Location: Right Arm)   Pulse (!) 58   Temp 97.8 F (36.6 C) (Oral)   Resp 16   SpO2 100%  Physical Exam Vitals and nursing note reviewed.  Constitutional:      General: She is not in acute distress.    Appearance: She is well-developed.  HENT:     Head: Normocephalic and atraumatic.  Eyes:     Conjunctiva/sclera: Conjunctivae normal.  Cardiovascular:     Rate and Rhythm: Normal rate and regular rhythm.     Heart sounds: No murmur heard. Pulmonary:     Effort: Pulmonary effort is normal. No respiratory distress.     Breath sounds: Normal breath sounds.  Abdominal:     Palpations: Abdomen is soft.     Tenderness: There is no abdominal tenderness.  Musculoskeletal:        General: No swelling.     Cervical back: Neck supple.  Skin:    General: Skin is warm and dry.     Capillary Refill: Capillary refill takes less than 2 seconds.  Neurological:     General: No focal deficit present.     Mental Status: She is alert and oriented to person, place, and time.  Psychiatric:        Mood and Affect: Mood normal.      ED Course/ Medical Decision Making/ A&P    Procedures Procedures   Medications Ordered in ED Medications  naproxen  (NAPROSYN ) tablet 500  mg (has no administration in time range)    Medical Decision Making:   [name] is a 78 y.o. female who presented to the ED today with generalized bodyaches detailed above.     Complete initial physical exam performed, notably the patient  was alert and oriented in no apparent distress.  Physical exam is unremarkable..    Reviewed and confirmed nursing documentation for past medical history, family history, social history.    Initial Assessment:   With the patient's presentation of generalized bodyaches, most likely diagnosis is chronic pain.   Initial Plan:  Based on presenting symptoms, and recent history of multiple evaluations for same, defer imaging and lab evaluation at this time.  Reassessment and Plan:   Exam was unremarkable, there are no remarkable findings on exam she has had extensive workup previously.  Multiple visits over the last month for the same.  Will manage pain with naproxen , noted to be mildly hypertensive, she does have history of essential hypertension and is on medications for the same.  She does not have any chest pain, shortness of breath, headache, or any acute visual changes.  As such, plan is for discharge with initial pain management and for her to continue taking her previously prescribed antihypertensive medications.  This  is explained to the patient, she has no further concerns at this time.     Clinical Impression:  1. Generalized body aches      Discharge   Final Clinical Impression(s) / ED Diagnoses Final diagnoses:  Generalized body aches    Rx / DC Orders ED Discharge Orders     None         [name] BROCKS, PA 10/25/23 1936    Patt Alm Macho, MD 10/25/23 2245

## 2023-10-25 NOTE — ED Triage Notes (Signed)
 Ambulatory to triage, steady gait.  Reports leg pain in her joints and muscles, tylenol  is not helping.

## 2023-10-28 ENCOUNTER — Encounter (HOSPITAL_COMMUNITY): Payer: Self-pay

## 2023-10-28 ENCOUNTER — Emergency Department (HOSPITAL_COMMUNITY)
Admission: EM | Admit: 2023-10-28 | Discharge: 2023-10-29 | Disposition: A | Discharge status: Home / Self Care | Attending: Emergency Medicine | Admitting: Emergency Medicine

## 2023-10-28 ENCOUNTER — Other Ambulatory Visit: Payer: Self-pay

## 2023-10-28 DIAGNOSIS — G8929 Other chronic pain: Secondary | ICD-10-CM | POA: Insufficient documentation

## 2023-10-28 DIAGNOSIS — W19XXXA Unspecified fall, initial encounter: Secondary | ICD-10-CM | POA: Diagnosis not present

## 2023-10-28 DIAGNOSIS — Y92811 Bus as the place of occurrence of the external cause: Secondary | ICD-10-CM | POA: Diagnosis not present

## 2023-10-28 DIAGNOSIS — M791 Myalgia, unspecified site: Secondary | ICD-10-CM | POA: Diagnosis present

## 2023-10-28 DIAGNOSIS — Z5900 Homelessness unspecified: Secondary | ICD-10-CM | POA: Diagnosis present

## 2023-10-28 DIAGNOSIS — M25561 Pain in right knee: Secondary | ICD-10-CM | POA: Diagnosis present
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
## 2023-10-29 ENCOUNTER — Encounter (HOSPITAL_COMMUNITY): Payer: Self-pay

## 2023-10-29 ENCOUNTER — Emergency Department (HOSPITAL_COMMUNITY)
Admission: EM | Admit: 2023-10-29 | Discharge: 2023-10-29 | Disposition: A | Discharge status: Home / Self Care | Source: Home / Self Care | Attending: Emergency Medicine | Admitting: Emergency Medicine

## 2023-10-29 ENCOUNTER — Other Ambulatory Visit: Payer: Self-pay

## 2023-10-29 ENCOUNTER — Emergency Department (HOSPITAL_COMMUNITY)
Admission: EM | Admit: 2023-10-29 | Discharge: 2023-10-29 | Disposition: A | Discharge status: Home / Self Care | Source: Home / Self Care

## 2023-10-29 DIAGNOSIS — G8929 Other chronic pain: Secondary | ICD-10-CM | POA: Insufficient documentation

## 2023-10-29 DIAGNOSIS — M25561 Pain in right knee: Secondary | ICD-10-CM | POA: Insufficient documentation

## 2023-10-29 DIAGNOSIS — Z5900 Homelessness unspecified: Secondary | ICD-10-CM | POA: Insufficient documentation

## 2023-10-29 MED ORDER — AMLODIPINE BESYLATE 5 MG PO TABS
10.0000 mg | ORAL_TABLET | Freq: Once | ORAL | Status: AC
  Administered 2023-10-29: 10 mg via ORAL
  Filled 2023-10-29: qty 2

## 2023-10-29 MED ORDER — CLONIDINE HCL 0.2 MG PO TABS
0.2000 mg | ORAL_TABLET | Freq: Once | ORAL | Status: AC
  Administered 2023-10-29: 0.2 mg via ORAL
  Filled 2023-10-29: qty 1
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
## 2023-10-29 NOTE — ED Triage Notes (Signed)
 Pt was discharged less than 3 minutes ago before checking back in.

## 2023-10-29 NOTE — ED Provider Notes (Signed)
 [place] EMERGENCY DEPARTMENT AT [facility] Provider Note   CSN: [id number] Arrival date & time: 10/29/23  2004     Patient presents with: medical problem   [name] is a 78 y.o. female.  {Add pertinent medical, surgical, social history, OB history to HPI:32947} HPI     Prior to Admission medications   Medication Sig Start Date End Date Taking? Authorizing Provider  acetaminophen  (TYLENOL ) 500 MG tablet Take 1 tablet (500 mg total) by mouth every 6 (six) hours as needed. 08/02/23   Elnor Jayson LABOR, DO  albuterol  (VENTOLIN  HFA) 108 (90 Base) MCG/ACT inhaler Inhale 1-2 puffs into the lungs every 6 (six) hours as needed for wheezing or shortness of breath. 04/22/20   Myrna Camelia HERO, NP  amLODipine  (NORVASC ) 10 MG tablet Take 1 tablet (10 mg total) by mouth daily. 01/23/23   Roemhildt, Lorin T, PA-C  artificial tears ophthalmic solution Place 1 drop into both eyes as needed for dry eyes. 09/20/23   Jarold Olam HERO, PA-C  chlorthalidone  (HYGROTON ) 25 MG tablet TAKE 1 TABLET (25 MG TOTAL) BY MOUTH DAILY. 11/20/21 11/20/22  Paseda, Folashade R, FNP  cloNIDine  (CATAPRES ) 0.2 MG tablet Take 1 tablet (0.2 mg total) by mouth 2 (two) times daily. 08/26/23   Bauer, Collin S, PA-C  diclofenac  Sodium (VOLTAREN  ARTHRITIS PAIN) 1 % GEL Apply 2 g topically 3 (three) times daily as needed. 09/04/23   Kammerer, Megan L, DO  diclofenac  Sodium (VOLTAREN ) 1 % GEL Apply 4 g topically 4 (four) times daily. 08/15/23   [name], PA-C  diphenhydrAMINE  (BENADRYL ) 25 MG tablet Take 1 tablet (25 mg total) by mouth every 6 (six) hours as needed for itching or allergies. 10/03/22   [name], PA-C  gabapentin  (NEURONTIN ) 100 MG capsule Take 1 capsule (100 mg total) by mouth 3 (three) times daily as needed. 04/30/23   Bero, Michael M, MD  hydrocortisone  cream 1 % Apply to affected area 2 times daily 09/22/23   Bauer, Collin S, PA-C  hydrOXYzine  (ATARAX ) 25 MG tablet Take 1 tablet (25 mg total) by mouth every 8  (eight) hours as needed. 11/23/22   Henderly, Britni A, PA-C  lidocaine  (LIDODERM ) 5 % Place 1 patch onto the skin daily. Remove & Discard patch within 12 hours or as directed by MD 08/02/23   Elnor Jayson A, DO  loratadine  (CLARITIN ) 10 MG tablet Take 1 tablet (10 mg total) by mouth daily. 04/06/23   Barrett, Warren SAILOR, PA-C  metFORMIN  (GLUCOPHAGE ) 500 MG tablet Take 1 tablet (500 mg total) by mouth 2 (two) times daily with a meal. 11/03/22 11/03/23  Lang Norleen POUR, PA-C  methocarbamol  (ROBAXIN ) 500 MG tablet Take 1 tablet (500 mg total) by mouth 2 (two) times daily. 03/19/23   Ula Prentice SAUNDERS, MD  metoprolol  succinate (TOPROL -XL) 25 MG 24 hr tablet Take 1 tablet (25 mg total) by mouth daily. 06/18/23   Melvenia Motto, MD  Multiple Vitamins-Minerals (MULTIVITAMIN WOMEN 50+) TABS Take 1 tablet by mouth daily. 10/15/23   Beverley Leita LABOR, PA-C  naproxen  (NAPROSYN ) 375 MG tablet Take 1 tablet (375 mg total) by mouth 2 (two) times daily as needed for moderate pain (pain score 4-6). 09/07/23   Sponseller, Pleasant SAUNDERS, PA-C  pantoprazole  (PROTONIX ) 20 MG tablet Take 1 tablet (20 mg total) by mouth daily. 10/10/21   Dean Clarity, MD  triamcinolone  cream (KENALOG ) 0.1 % Apply 1 Application topically 2 (two) times daily. 07/07/23   Vicky Charleston, PA-C  Allergies: Codeine, Ibuprofen , and Imdur [isosorbide nitrate]    Review of Systems  Updated Vital Signs BP (!) 112/53 (BP Location: Left Arm)   Pulse (!) 54   Temp 98 F (36.7 C) (Oral)   Resp 12   SpO2 100%   Physical Exam  (all labs ordered are listed, but only abnormal results are displayed) Labs Reviewed - No data to display  EKG: None  Radiology: No results found.  {Document cardiac monitor, telemetry assessment procedure when appropriate:32947} Procedures   Medications Ordered in the ED - No data to display    {Click here for ABCD2, HEART and other calculators REFRESH Note before signing:1}                              Medical Decision  Making  ***  {Document critical care time when appropriate  Document review of labs and clinical decision tools ie CHADS2VASC2, etc  Document your independent review of radiology images and any outside records  Document your discussion with family members, caretakers and with consultants  Document social determinants of health affecting pt's care  Document your decision making why or why not admission, treatments were needed:32947:::1}   Final diagnoses:  None    ED Discharge Orders     None
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
## 2023-10-29 NOTE — Discharge Instructions (Signed)
 Your blood pressure was elevated here.  We have given you your nightly dose of medications.  Please make sure to take all of your medications as prescribed. Follow-up with your doctor. Return here for new concerns
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
## 2023-10-30 ENCOUNTER — Encounter (HOSPITAL_COMMUNITY): Payer: Self-pay | Admitting: Emergency Medicine

## 2023-10-30 ENCOUNTER — Emergency Department (HOSPITAL_COMMUNITY)
Admission: EM | Admit: 2023-10-30 | Discharge: 2023-10-30 | Disposition: A | Discharge status: Home / Self Care | Attending: Emergency Medicine | Admitting: Emergency Medicine

## 2023-10-30 DIAGNOSIS — G8929 Other chronic pain: Secondary | ICD-10-CM | POA: Insufficient documentation

## 2023-10-30 DIAGNOSIS — M542 Cervicalgia: Secondary | ICD-10-CM | POA: Diagnosis present

## 2023-10-30 MED ORDER — DICLOFENAC SODIUM 1 % EX GEL: 2.0000 g | Freq: Three times a day (TID) | CUTANEOUS | 0 refills | Status: DC | PRN
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
## 2023-10-30 NOTE — ED Notes (Signed)
 Patient Alert and oriented to baseline. Stable and ambulatory to baseline. Patient verbalized understanding of the discharge instructions.  Patient belongings were taken by the patient.

## 2023-10-30 NOTE — Discharge Instructions (Addendum)
 I have sent Voltaren  gel into the pharmacy for you.  Return for any emergent symptoms otherwise follow-up with your primary care doctor.
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
## 2023-10-31 ENCOUNTER — Emergency Department (HOSPITAL_COMMUNITY)
Admission: EM | Admit: 2023-10-31 | Discharge: 2023-10-31 | Disposition: A | Discharge status: Home / Self Care | Attending: Emergency Medicine | Admitting: Emergency Medicine

## 2023-10-31 ENCOUNTER — Encounter (HOSPITAL_COMMUNITY): Payer: Self-pay | Admitting: *Deleted

## 2023-10-31 ENCOUNTER — Other Ambulatory Visit: Payer: Self-pay

## 2023-10-31 DIAGNOSIS — R030 Elevated blood-pressure reading, without diagnosis of hypertension: Secondary | ICD-10-CM | POA: Diagnosis not present

## 2023-10-31 DIAGNOSIS — G8929 Other chronic pain: Secondary | ICD-10-CM | POA: Insufficient documentation

## 2023-10-31 DIAGNOSIS — M791 Myalgia, unspecified site: Secondary | ICD-10-CM | POA: Diagnosis present

## 2023-10-31 MED ORDER — CLONIDINE HCL 0.2 MG PO TABS
0.2000 mg | ORAL_TABLET | Freq: Once | ORAL | Status: AC
  Administered 2023-10-31: 0.2 mg via ORAL
  Filled 2023-10-31: qty 1

## 2023-10-31 MED ORDER — AMLODIPINE BESYLATE 5 MG PO TABS: 10.0000 mg | ORAL_TABLET | Freq: Once | ORAL | Status: DC

## 2023-10-31 MED ORDER — AMLODIPINE BESYLATE 5 MG PO TABS
5.0000 mg | ORAL_TABLET | Freq: Once | ORAL | Status: AC
  Administered 2023-10-31: 5 mg via ORAL
  Filled 2023-10-31: qty 1
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
## 2023-10-31 NOTE — Discharge Instructions (Signed)
 Take diclofenac  gel as directed.  You are given blood pressure medication in the emergency department.  Return for any emergent symptoms.

## 2023-11-01 ENCOUNTER — Encounter (HOSPITAL_COMMUNITY): Payer: Self-pay

## 2023-11-01 ENCOUNTER — Emergency Department (HOSPITAL_COMMUNITY): Admission: EM | Admit: 2023-11-01 | Discharge: 2023-11-02 | Disposition: A | Discharge status: Home / Self Care

## 2023-11-01 ENCOUNTER — Other Ambulatory Visit: Payer: Self-pay

## 2023-11-01 DIAGNOSIS — I10 Essential (primary) hypertension: Secondary | ICD-10-CM | POA: Insufficient documentation

## 2023-11-01 DIAGNOSIS — Z79899 Other long term (current) drug therapy: Secondary | ICD-10-CM | POA: Diagnosis not present

## 2023-11-01 DIAGNOSIS — G8929 Other chronic pain: Secondary | ICD-10-CM | POA: Insufficient documentation
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
## 2023-11-02 DIAGNOSIS — G8929 Other chronic pain: Secondary | ICD-10-CM | POA: Diagnosis not present

## 2023-11-02 MED ORDER — NAPROXEN 250 MG PO TABS
500.0000 mg | ORAL_TABLET | Freq: Once | ORAL | Status: AC
  Administered 2023-11-02: 500 mg via ORAL
  Filled 2023-11-02: qty 2

## 2023-11-02 MED ORDER — CLONIDINE HCL 0.2 MG PO TABS
0.2000 mg | ORAL_TABLET | Freq: Two times a day (BID) | ORAL | Status: DC
Start: 2023-11-02 — End: 2023-11-02
  Administered 2023-11-02: 0.2 mg via ORAL
  Filled 2023-11-02: qty 1

## 2023-11-02 MED ORDER — AMLODIPINE BESYLATE 5 MG PO TABS
10.0000 mg | ORAL_TABLET | Freq: Every day | ORAL | Status: DC
  Administered 2023-11-02: 10 mg via ORAL
  Filled 2023-11-02: qty 2

## 2023-11-02 NOTE — ED Notes (Signed)
 Patient Alert and oriented to baseline. Stable and ambulatory to baseline. Patient verbalized understanding of the discharge instructions.  Patient belongings were taken by the patient.

## 2023-11-02 NOTE — ED Provider Notes (Signed)
 [place] EMERGENCY DEPARTMENT AT [facility] Provider Note   CSN: [id number] Arrival date & time: 11/01/23  1751     Patient presents with: Pain   [name] is a 78 y.o. female presents with complaints of chronic pain, noted history of frequent complaints over the last year for the same.  Last visit noted to be on 8 October, was given her home prescriptions of her blood pressure medications at that time secondary to her hypertension, had not taken her blood pressure medications.  Has been in the department for approximately 18 hours at time of assessment, would be due for regular dosing of blood pressure medications at this time.  Denies having any chest pain, shortness of breath, new onset headache or visual difficulties.  Does have chronic complaint of visual impairment, has not followed up with ophthalmology as previously referred.   HPI     Prior to Admission medications   Medication Sig Start Date End Date Taking? Authorizing Provider  acetaminophen  (TYLENOL ) 500 MG tablet Take 1 tablet (500 mg total) by mouth every 6 (six) hours as needed. 08/02/23   Elnor Jayson LABOR, DO  albuterol  (VENTOLIN  HFA) 108 (90 Base) MCG/ACT inhaler Inhale 1-2 puffs into the lungs every 6 (six) hours as needed for wheezing or shortness of breath. 04/22/20   Myrna Camelia HERO, NP  amLODipine  (NORVASC ) 10 MG tablet Take 1 tablet (10 mg total) by mouth daily. 01/23/23   Roemhildt, Lorin T, PA-C  artificial tears ophthalmic solution Place 1 drop into both eyes as needed for dry eyes. 09/20/23   Jarold Olam HERO, PA-C  chlorthalidone  (HYGROTON ) 25 MG tablet TAKE 1 TABLET (25 MG TOTAL) BY MOUTH DAILY. 11/20/21 11/20/22  Paseda, Folashade R, FNP  cloNIDine  (CATAPRES ) 0.2 MG tablet Take 1 tablet (0.2 mg total) by mouth 2 (two) times daily. 08/26/23   Bauer, Collin S, PA-C  diclofenac  Sodium (VOLTAREN  ARTHRITIS PAIN) 1 % GEL Apply 2 g topically 3 (three) times daily as needed. 10/30/23   Hildegard Loge, PA-C   diphenhydrAMINE  (BENADRYL ) 25 MG tablet Take 1 tablet (25 mg total) by mouth every 6 (six) hours as needed for itching or allergies. 10/03/22   [name], PA-C  gabapentin  (NEURONTIN ) 100 MG capsule Take 1 capsule (100 mg total) by mouth 3 (three) times daily as needed. 04/30/23   Bero, Michael M, MD  hydrocortisone  cream 1 % Apply to affected area 2 times daily 09/22/23   Bauer, Collin S, PA-C  hydrOXYzine  (ATARAX ) 25 MG tablet Take 1 tablet (25 mg total) by mouth every 8 (eight) hours as needed. 11/23/22   Henderly, Britni A, PA-C  lidocaine  (LIDODERM ) 5 % Place 1 patch onto the skin daily. Remove & Discard patch within 12 hours or as directed by MD 08/02/23   Elnor Jayson LABOR, DO  loratadine  (CLARITIN ) 10 MG tablet Take 1 tablet (10 mg total) by mouth daily. 04/06/23   Barrett, Jamie N, PA-C  metFORMIN  (GLUCOPHAGE ) 500 MG tablet Take 1 tablet (500 mg total) by mouth 2 (two) times daily with a meal. 11/03/22 11/03/23  Lang Norleen POUR, PA-C  methocarbamol  (ROBAXIN ) 500 MG tablet Take 1 tablet (500 mg total) by mouth 2 (two) times daily. 03/19/23   Ula Prentice SAUNDERS, MD  metoprolol  succinate (TOPROL -XL) 25 MG 24 hr tablet Take 1 tablet (25 mg total) by mouth daily. 06/18/23   Melvenia Motto, MD  Multiple Vitamins-Minerals (MULTIVITAMIN WOMEN 50+) TABS Take 1 tablet by mouth daily. 10/15/23   Beverley Doffing  A, PA-C  naproxen  (NAPROSYN ) 375 MG tablet Take 1 tablet (375 mg total) by mouth 2 (two) times daily as needed for moderate pain (pain score 4-6). 09/07/23   Sponseller, Pleasant SAUNDERS, PA-C  pantoprazole  (PROTONIX ) 20 MG tablet Take 1 tablet (20 mg total) by mouth daily. 10/10/21   Dean Clarity, MD  triamcinolone  cream (KENALOG ) 0.1 % Apply 1 Application topically 2 (two) times daily. 07/07/23   Vicky Charleston, PA-C    Allergies: Codeine, Ibuprofen , and Imdur [isosorbide nitrate]    Review of Systems  Musculoskeletal:  Positive for arthralgias.  All other systems reviewed and are negative.   Updated Vital  Signs BP (!) 194/66 (BP Location: Left Arm)   Pulse (!) 58   Temp 98.1 F (36.7 C)   Resp 17   Ht 5' 4 (1.626 m)   Wt 55 kg   SpO2 100%   BMI 20.81 kg/m   Physical Exam Vitals and nursing note reviewed.  Constitutional:      General: She is awake. She is not in acute distress.    Appearance: Normal appearance.     Interventions: Cervical collar in place.     Comments: Soft c-collar worn by patient upon arrival.  HENT:     Head: Normocephalic and atraumatic.     Mouth/Throat:     Mouth: Mucous membranes are moist.     Pharynx: Oropharynx is clear.  Eyes:     Extraocular Movements: Extraocular movements intact.     Conjunctiva/sclera: Conjunctivae normal.     Pupils: Pupils are equal, round, and reactive to light.  Cardiovascular:     Rate and Rhythm: Normal rate and regular rhythm.     Pulses: Normal pulses.     Heart sounds: Normal heart sounds. No murmur heard.    No friction rub. No gallop.  Pulmonary:     Effort: Pulmonary effort is normal.     Breath sounds: Normal breath sounds.  Abdominal:     General: Abdomen is flat. Bowel sounds are normal.     Palpations: Abdomen is soft.  Musculoskeletal:        General: Normal range of motion.     Cervical back: Normal range of motion and neck supple.     Right lower leg: No edema.     Left lower leg: No edema.  Skin:    General: Skin is warm and dry.     Capillary Refill: Capillary refill takes less than 2 seconds.  Neurological:     General: No focal deficit present.     Mental Status: She is alert and oriented to person, place, and time. Mental status is at baseline.     GCS: GCS eye subscore is 4. GCS verbal subscore is 5. GCS motor subscore is 6.     Cranial Nerves: Cranial nerves 2-12 are intact.     Sensory: Sensation is intact.     Motor: Motor function is intact.     Coordination: Coordination is intact.     Gait: Gait is intact.  Psychiatric:        Mood and Affect: Mood normal.        Behavior:  Behavior is cooperative.     (all labs ordered are listed, but only abnormal results are displayed) Labs Reviewed - No data to display  EKG: None  Radiology: No results found.   Procedures   Medications Ordered in the ED  amLODipine  (NORVASC ) tablet 10 mg (10 mg Oral Given 11/02/23 0852)  cloNIDine  (  CATAPRES ) tablet 0.2 mg (0.2 mg Oral Given 11/02/23 0853)  naproxen  (NAPROSYN ) tablet 500 mg (500 mg Oral Given 11/02/23 9147)                                    Medical Decision Making Risk Prescription drug management.   Reviewed previous medical history as well as with physical assessment, findings she does not have any acute condition of management today.  Will manage chronic pain with NSAID as noted, also do secondary to her hypertension and not having taken her morning dose due to having been in the department for a prolonged amount of time, will give her her standing dose of previously prescribed antihypertensive medication.  Given no concerning findings on physical exam, find she is stable for discharge and outpatient follow-up at this time.     Final diagnoses:  Other chronic pain  Essential hypertension    ED Discharge Orders     None          [name] BROCKS, PA 11/02/23 9097    Lenor Hollering, MD 11/02/23 [phone]

## 2023-11-06 ENCOUNTER — Emergency Department (HOSPITAL_COMMUNITY)
Admission: EM | Admit: 2023-11-06 | Discharge: 2023-11-06 | Disposition: A | Discharge status: Home / Self Care | Attending: Emergency Medicine | Admitting: Emergency Medicine

## 2023-11-06 ENCOUNTER — Other Ambulatory Visit: Payer: Self-pay

## 2023-11-06 ENCOUNTER — Encounter (HOSPITAL_COMMUNITY): Payer: Self-pay | Admitting: *Deleted

## 2023-11-06 DIAGNOSIS — I10 Essential (primary) hypertension: Secondary | ICD-10-CM | POA: Insufficient documentation

## 2023-11-06 DIAGNOSIS — Z79899 Other long term (current) drug therapy: Secondary | ICD-10-CM | POA: Insufficient documentation

## 2023-11-06 DIAGNOSIS — Z760 Encounter for issue of repeat prescription: Secondary | ICD-10-CM | POA: Insufficient documentation

## 2023-11-06 MED ORDER — ROSUVASTATIN CALCIUM 10 MG PO TABS: 10.0000 mg | ORAL_TABLET | Freq: Every day | ORAL | 0 refills | Status: DC

## 2023-11-06 MED ORDER — METOPROLOL SUCCINATE ER 25 MG PO TB24: 25.0000 mg | ORAL_TABLET | Freq: Every day | ORAL | 0 refills | Status: DC

## 2023-11-06 MED ORDER — AMLODIPINE BESYLATE 10 MG PO TABS: 10.0000 mg | ORAL_TABLET | Freq: Every day | ORAL | 0 refills | Status: DC

## 2023-11-06 MED ORDER — METFORMIN HCL 500 MG PO TABS: 500.0000 mg | ORAL_TABLET | Freq: Two times a day (BID) | ORAL | 0 refills | Status: DC

## 2023-11-06 NOTE — ED Triage Notes (Signed)
 Pt came in via POV requesting medication refills.

## 2023-11-06 NOTE — Discharge Instructions (Addendum)
 Today you were seen for medication refill.  Thank you for letting us  treat you today. After performing a physical exam, I feel you are safe to go home. Please follow up with your PCP in the next several days and provide them with your records from this visit. Return to the Emergency Room if pain becomes severe or symptoms worsen.

## 2023-11-06 NOTE — ED Provider Notes (Signed)
 [place] EMERGENCY DEPARTMENT AT [facility] Provider Note   CSN: [id number] Arrival date & time: 11/06/23  1649     Patient presents with: Medication Refill    [name] is a 78 y.o. female presents today for medication refills.  Patient reports that she is out of [place] is out of many of her medications.  Patient has medication bottles with her.  Patient denies fever, headache, numbness, weakness, chills, nausea, vomiting, chest pain, shortness of breath, any other complaints at this time.   HPI     Prior to Admission medications   Medication Sig Start Date End Date Taking? Authorizing Provider  amLODipine  (NORVASC ) 10 MG tablet Take 1 tablet (10 mg total) by mouth daily. 11/06/23  Yes Laporshia Hogen N, PA-C  metFORMIN  (GLUCOPHAGE ) 500 MG tablet Take 1 tablet (500 mg total) by mouth 2 (two) times daily with a meal. 11/06/23  Yes Francis Ileana SAILOR, PA-C  metoprolol  succinate (TOPROL -XL) 25 MG 24 hr tablet Take 1 tablet (25 mg total) by mouth daily. 11/06/23  Yes Francis Ileana SAILOR, PA-C  rosuvastatin  (CRESTOR ) 10 MG tablet Take 1 tablet (10 mg total) by mouth daily. 11/06/23  Yes Bobbyjo Marulanda N, PA-C  acetaminophen  (TYLENOL ) 500 MG tablet Take 1 tablet (500 mg total) by mouth every 6 (six) hours as needed. 08/02/23   Elnor Jayson LABOR, DO  albuterol  (VENTOLIN  HFA) 108 (90 Base) MCG/ACT inhaler Inhale 1-2 puffs into the lungs every 6 (six) hours as needed for wheezing or shortness of breath. 04/22/20   Myrna Camelia HERO, NP  amLODipine  (NORVASC ) 10 MG tablet Take 1 tablet (10 mg total) by mouth daily. 01/23/23   Roemhildt, Lorin T, PA-C  artificial tears ophthalmic solution Place 1 drop into both eyes as needed for dry eyes. 09/20/23   Jarold Olam HERO, PA-C  chlorthalidone  (HYGROTON ) 25 MG tablet TAKE 1 TABLET (25 MG TOTAL) BY MOUTH DAILY. 11/20/21 11/20/22  Paseda, Folashade R, FNP  cloNIDine  (CATAPRES ) 0.2 MG tablet Take 1 tablet (0.2 mg total) by mouth 2 (two) times daily. 08/26/23   Bauer,  Collin S, PA-C  diclofenac  Sodium (VOLTAREN  ARTHRITIS PAIN) 1 % GEL Apply 2 g topically 3 (three) times daily as needed. 10/30/23   Hildegard, Amjad, PA-C  diphenhydrAMINE  (BENADRYL ) 25 MG tablet Take 1 tablet (25 mg total) by mouth every 6 (six) hours as needed for itching or allergies. 10/03/22   [name], PA-C  gabapentin  (NEURONTIN ) 100 MG capsule Take 1 capsule (100 mg total) by mouth 3 (three) times daily as needed. 04/30/23   Bero, Michael M, MD  hydrocortisone  cream 1 % Apply to affected area 2 times daily 09/22/23   Bauer, Collin S, PA-C  hydrOXYzine  (ATARAX ) 25 MG tablet Take 1 tablet (25 mg total) by mouth every 8 (eight) hours as needed. 11/23/22   Henderly, Britni A, PA-C  lidocaine  (LIDODERM ) 5 % Place 1 patch onto the skin daily. Remove & Discard patch within 12 hours or as directed by MD 08/02/23   Elnor Jayson LABOR, DO  loratadine  (CLARITIN ) 10 MG tablet Take 1 tablet (10 mg total) by mouth daily. 04/06/23   Barrett, Warren SAILOR, PA-C  metFORMIN  (GLUCOPHAGE ) 500 MG tablet Take 1 tablet (500 mg total) by mouth 2 (two) times daily with a meal. 11/03/22 11/03/23  Lang Norleen POUR, PA-C  methocarbamol  (ROBAXIN ) 500 MG tablet Take 1 tablet (500 mg total) by mouth 2 (two) times daily. 03/19/23   Ula Prentice SAUNDERS, MD  metoprolol  succinate (TOPROL -XL)  25 MG 24 hr tablet Take 1 tablet (25 mg total) by mouth daily. 06/18/23   Melvenia Motto, MD  Multiple Vitamins-Minerals (MULTIVITAMIN WOMEN 50+) TABS Take 1 tablet by mouth daily. 10/15/23   Beverley Leita LABOR, PA-C  naproxen  (NAPROSYN ) 375 MG tablet Take 1 tablet (375 mg total) by mouth 2 (two) times daily as needed for moderate pain (pain score 4-6). 09/07/23   Sponseller, Pleasant SAUNDERS, PA-C  pantoprazole  (PROTONIX ) 20 MG tablet Take 1 tablet (20 mg total) by mouth daily. 10/10/21   Dean Clarity, MD  triamcinolone  cream (KENALOG ) 0.1 % Apply 1 Application topically 2 (two) times daily. 07/07/23   Vicky Charleston, PA-C    Allergies: Codeine, Ibuprofen , and Imdur  [isosorbide nitrate]    Review of Systems  Updated Vital Signs BP (!) 201/76 (BP Location: Right Arm)   Pulse 60   Temp 99 F (37.2 C)   Resp 18   Ht 5' 4 (1.626 m)   Wt 55 kg   SpO2 99%   BMI 20.81 kg/m   Physical Exam Vitals and nursing note reviewed.  Constitutional:      General: She is not in acute distress.    Appearance: Normal appearance. She is well-developed. She is not toxic-appearing.  HENT:     Head: Normocephalic and atraumatic.     Right Ear: External ear normal.     Left Ear: External ear normal.  Eyes:     Extraocular Movements: Extraocular movements intact.     Conjunctiva/sclera: Conjunctivae normal.  Neck:     Comments: In soft collar which patient is known to wear at all times. Cardiovascular:     Rate and Rhythm: Normal rate and regular rhythm.  Pulmonary:     Effort: Pulmonary effort is normal. No respiratory distress.  Abdominal:     General: There is no distension.     Palpations: Abdomen is soft.  Musculoskeletal:     Cervical back: Neck supple.  Skin:    General: Skin is warm and dry.     Capillary Refill: Capillary refill takes less than 2 seconds.  Neurological:     General: No focal deficit present.     Mental Status: She is alert and oriented to person, place, and time.  Psychiatric:        Mood and Affect: Mood normal.     (all labs ordered are listed, but only abnormal results are displayed) Labs Reviewed - No data to display  EKG: None  Radiology: No results found.   Procedures   Medications Ordered in the ED - No data to display                                  Medical Decision Making Risk Prescription drug management.   This patient presents to the ED for concern of medication refill differential diagnosis includes medication refill  Problem List / ED Course:  Considered for admission and further workup however patient's vital signs and physical exam are reassuring.  Patient is hypertensive, however while  doing medication refills it appears patient has been out of her two hypertension medications for quite some time.  These have now been refilled with 90-day supplies.  Patient given return precautions.  I feel patient is safe for discharge at this time   Social Determinants of Health:  Homelessness        Final diagnoses:  Medication refill    ED Discharge  Orders          Ordered    rosuvastatin  (CRESTOR ) 10 MG tablet  Daily        11/06/23 2238    metoprolol  succinate (TOPROL -XL) 25 MG 24 hr tablet  Daily        11/06/23 2238    amLODipine  (NORVASC ) 10 MG tablet  Daily        11/06/23 2238    metFORMIN  (GLUCOPHAGE ) 500 MG tablet  2 times daily with meals        11/06/23 2238               Francis Ileana SAILOR, PA-C 11/06/23 2317    [name], MD 11/06/23 2340

## 2023-11-10 ENCOUNTER — Ambulatory Visit (HOSPITAL_COMMUNITY): Admission: EM | Admit: 2023-11-10 | Discharge: 2023-11-10 | Disposition: A | Discharge status: Home / Self Care

## 2023-11-10 ENCOUNTER — Encounter (HOSPITAL_COMMUNITY): Payer: Self-pay

## 2023-11-10 DIAGNOSIS — M7918 Myalgia, other site: Secondary | ICD-10-CM

## 2023-11-10 DIAGNOSIS — M2550 Pain in unspecified joint: Secondary | ICD-10-CM | POA: Diagnosis not present

## 2023-11-10 DIAGNOSIS — W1840XA Slipping, tripping and stumbling without falling, unspecified, initial encounter: Secondary | ICD-10-CM

## 2023-11-10 MED ORDER — ACETAMINOPHEN 325 MG PO TABS
975.0000 mg | ORAL_TABLET | Freq: Once | ORAL | Status: AC
  Administered 2023-11-10: 975 mg via ORAL

## 2023-11-10 MED ORDER — ACETAMINOPHEN 325 MG PO TABS
ORAL_TABLET | ORAL | Status: AC
  Filled 2023-11-10: qty 3

## 2023-11-10 MED ORDER — HYDROCODONE-ACETAMINOPHEN 5-325 MG PO TABS: 1.0000 | ORAL_TABLET | Freq: Once | ORAL | Status: DC

## 2023-11-10 MED ORDER — IBUPROFEN 200 MG PO TABS
ORAL_TABLET | ORAL | Status: AC
  Filled 2023-11-10: qty 2

## 2023-11-10 MED ORDER — IBUPROFEN 200 MG PO TABS
400.0000 mg | ORAL_TABLET | Freq: Once | ORAL | Status: AC
  Administered 2023-11-10: 400 mg via ORAL

## 2023-11-10 NOTE — Discharge Instructions (Signed)
 Today you have been diagnosed with a musculoskeletal injury.  Adults may use 400 mg of ibuprofen  and 1000 mg of Tylenol  together every 8 hours as needed for pain control.  Children may take ibuprofen  and Tylenol  as directed on medication packaging.  You should use ice on affected area for 20 minutes at a time a couple times a day for the first 24 hours then you may switch to heat in the same intervals.  Be sure to put a barrier between ice or heat source and skin to prevent burns.  May also wrap affected area and Ace bandage if tolerated and appropriate, and elevate above the level of the heart to help reduce swelling.  Do not wrap Ace bandages around neck or torso as wrapping too tight can restrict air movement inability to breathe.  If symptoms do not seem to be improving in 3 to 5 days after following these instructions we need to follow-up with orthopedist or PCP.
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
## 2023-11-12 ENCOUNTER — Other Ambulatory Visit: Payer: Self-pay

## 2023-11-12 ENCOUNTER — Emergency Department (HOSPITAL_COMMUNITY)
Admission: EM | Admit: 2023-11-12 | Discharge: 2023-11-13 | Disposition: A | Discharge status: Home / Self Care | Attending: Emergency Medicine | Admitting: Emergency Medicine

## 2023-11-12 ENCOUNTER — Encounter (HOSPITAL_COMMUNITY): Payer: Self-pay

## 2023-11-12 DIAGNOSIS — Z79899 Other long term (current) drug therapy: Secondary | ICD-10-CM | POA: Insufficient documentation

## 2023-11-12 DIAGNOSIS — R52 Pain, unspecified: Secondary | ICD-10-CM | POA: Insufficient documentation

## 2023-11-12 DIAGNOSIS — E119 Type 2 diabetes mellitus without complications: Secondary | ICD-10-CM | POA: Insufficient documentation

## 2023-11-12 DIAGNOSIS — Z7984 Long term (current) use of oral hypoglycemic drugs: Secondary | ICD-10-CM | POA: Insufficient documentation

## 2023-11-12 DIAGNOSIS — I10 Essential (primary) hypertension: Secondary | ICD-10-CM | POA: Diagnosis not present

## 2023-11-12 NOTE — ED Triage Notes (Signed)
 Pateint states that she has body pain all over and tylenol  is not working.

## 2023-11-13 ENCOUNTER — Emergency Department (HOSPITAL_COMMUNITY)
Admission: EM | Admit: 2023-11-13 | Discharge: 2023-11-14 | Disposition: A | Discharge status: Home / Self Care | Source: Home / Self Care

## 2023-11-13 DIAGNOSIS — E119 Type 2 diabetes mellitus without complications: Secondary | ICD-10-CM | POA: Insufficient documentation

## 2023-11-13 DIAGNOSIS — I10 Essential (primary) hypertension: Secondary | ICD-10-CM | POA: Insufficient documentation

## 2023-11-13 DIAGNOSIS — R52 Pain, unspecified: Secondary | ICD-10-CM | POA: Diagnosis not present

## 2023-11-13 DIAGNOSIS — Z79899 Other long term (current) drug therapy: Secondary | ICD-10-CM | POA: Insufficient documentation

## 2023-11-13 MED ORDER — ACETAMINOPHEN 500 MG PO TABS
1000.0000 mg | ORAL_TABLET | Freq: Once | ORAL | Status: AC
  Administered 2023-11-13: 1000 mg via ORAL

## 2023-11-13 MED ORDER — ACETAMINOPHEN 500 MG PO TABS
ORAL_TABLET | ORAL | Status: AC
  Filled 2023-11-13: qty 2

## 2023-11-13 MED ORDER — NAPHAZOLINE-GLYCERIN 0.012-0.25 % OP SOLN
1.0000 [drp] | Freq: Four times a day (QID) | OPHTHALMIC | Status: DC | PRN
  Administered 2023-11-13: 1 [drp] via OPHTHALMIC
  Filled 2023-11-13: qty 15

## 2023-11-13 NOTE — ED Notes (Signed)
 Discharge instructions reviewed.   Opportunity for questions and concerns provided.   Alert, oriented and ambulatory.   Displays no signs of distress.
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
## 2023-11-13 NOTE — Discharge Instructions (Addendum)
 Please follow-up with [facility] [place] [facility] for PCP needs.  Please take Tylenol  for pain.  Return to ED with new symptoms.

## 2023-11-13 NOTE — ED Triage Notes (Signed)
 Pt is here for body aches again tonight - same as last night.

## 2023-11-14 MED ORDER — ACETAMINOPHEN 325 MG PO TABS
ORAL_TABLET | ORAL | Status: AC
Start: 2023-11-14 — End: 2023-11-14
  Filled 2023-11-14: qty 2

## 2023-11-14 MED ORDER — ACETAMINOPHEN 325 MG PO TABS
650.0000 mg | ORAL_TABLET | Freq: Once | ORAL | Status: AC
  Administered 2023-11-14: 650 mg via ORAL

## 2023-11-14 NOTE — Discharge Instructions (Signed)
 Today you were seen for body aches.  You may take over-the-counter Tylenol  as needed for pain.  Thank you for letting us  treat you today. After performing a physical exam, I feel you are safe to go home. Please follow up with your PCP in the next several days and provide them with your records from this visit. Return to the Emergency Room if pain becomes severe or symptoms worsen.

## 2023-11-14 NOTE — ED Provider Notes (Signed)
 [place] EMERGENCY DEPARTMENT AT [facility] Provider Note   CSN: [id number] Arrival date & time: 11/13/23  2342     Patient presents with: No chief complaint on file.   [name] is a 78 y.o. female.  Past medical astray significant for diabetes and hypertension today presenting for body aches.  Patient was seen for same last night.  This is patient's 122nd visit for similar complaints in 6 months with 0 admissions.  Patient denies any changes to her symptoms at this time.  Patient denies fever, chills, nausea, vomiting, chest pain, shortness of breath, any other complaints at this time.   HPI     Prior to Admission medications   Medication Sig Start Date End Date Taking? Authorizing Provider  acetaminophen  (TYLENOL ) 500 MG tablet Take 1 tablet (500 mg total) by mouth every 6 (six) hours as needed. 08/02/23   Elnor Jayson LABOR, DO  albuterol  (VENTOLIN  HFA) 108 (90 Base) MCG/ACT inhaler Inhale 1-2 puffs into the lungs every 6 (six) hours as needed for wheezing or shortness of breath. 04/22/20   Myrna Camelia HERO, NP  amLODipine  (NORVASC ) 10 MG tablet Take 1 tablet (10 mg total) by mouth daily. 01/23/23   Roemhildt, Lorin T, PA-C  amLODipine  (NORVASC ) 10 MG tablet Take 1 tablet (10 mg total) by mouth daily. 11/06/23   Francis Ileana SAILOR, PA-C  artificial tears ophthalmic solution Place 1 drop into both eyes as needed for dry eyes. 09/20/23   Jarold Olam HERO, PA-C  chlorthalidone  (HYGROTON ) 25 MG tablet TAKE 1 TABLET (25 MG TOTAL) BY MOUTH DAILY. 11/20/21 11/20/22  Paseda, Folashade R, FNP  cloNIDine  (CATAPRES ) 0.2 MG tablet Take 1 tablet (0.2 mg total) by mouth 2 (two) times daily. 08/26/23   Bauer, Collin S, PA-C  diclofenac  Sodium (VOLTAREN  ARTHRITIS PAIN) 1 % GEL Apply 2 g topically 3 (three) times daily as needed. 10/30/23   Hildegard, Amjad, PA-C  diphenhydrAMINE  (BENADRYL ) 25 MG tablet Take 1 tablet (25 mg total) by mouth every 6 (six) hours as needed for itching or allergies. 10/03/22    [name], PA-C  gabapentin  (NEURONTIN ) 100 MG capsule Take 1 capsule (100 mg total) by mouth 3 (three) times daily as needed. 04/30/23   Bero, Michael M, MD  hydrocortisone  cream 1 % Apply to affected area 2 times daily 09/22/23   Bauer, Collin S, PA-C  hydrOXYzine  (ATARAX ) 25 MG tablet Take 1 tablet (25 mg total) by mouth every 8 (eight) hours as needed. 11/23/22   Henderly, Britni A, PA-C  lidocaine  (LIDODERM ) 5 % Place 1 patch onto the skin daily. Remove & Discard patch within 12 hours or as directed by MD 08/02/23   Elnor Jayson LABOR, DO  loratadine  (CLARITIN ) 10 MG tablet Take 1 tablet (10 mg total) by mouth daily. 04/06/23   Barrett, Warren SAILOR, PA-C  metFORMIN  (GLUCOPHAGE ) 500 MG tablet Take 1 tablet (500 mg total) by mouth 2 (two) times daily with a meal. 11/03/22 11/03/23  Lang Norleen POUR, PA-C  metFORMIN  (GLUCOPHAGE ) 500 MG tablet Take 1 tablet (500 mg total) by mouth 2 (two) times daily with a meal. 11/06/23   Francis Ileana SAILOR, PA-C  methocarbamol  (ROBAXIN ) 500 MG tablet Take 1 tablet (500 mg total) by mouth 2 (two) times daily. 03/19/23   Ula Prentice SAUNDERS, MD  metoprolol  succinate (TOPROL -XL) 25 MG 24 hr tablet Take 1 tablet (25 mg total) by mouth daily. 06/18/23   Melvenia Motto, MD  metoprolol  succinate (TOPROL -XL) 25 MG 24 hr  tablet Take 1 tablet (25 mg total) by mouth daily. 11/06/23   Rainn Zupko N, PA-C  Multiple Vitamins-Minerals (MULTIVITAMIN WOMEN 50+) TABS Take 1 tablet by mouth daily. 10/15/23   Beverley Leita LABOR, PA-C  naproxen  (NAPROSYN ) 375 MG tablet Take 1 tablet (375 mg total) by mouth 2 (two) times daily as needed for moderate pain (pain score 4-6). 09/07/23   Sponseller, Rebekah R, PA-C  pantoprazole  (PROTONIX ) 20 MG tablet Take 1 tablet (20 mg total) by mouth daily. 10/10/21   Dean Clarity, MD  rosuvastatin  (CRESTOR ) 10 MG tablet Take 1 tablet (10 mg total) by mouth daily. 11/06/23   Donye Campanelli N, PA-C  triamcinolone  cream (KENALOG ) 0.1 % Apply 1 Application topically 2 (two) times  daily. 07/07/23   Vicky Charleston, PA-C    Allergies: Codeine, Ibuprofen , and Imdur [isosorbide nitrate]    Review of Systems  Musculoskeletal:  Positive for myalgias.    Updated Vital Signs BP (!) 169/58   Pulse 63   Temp 97.8 F (36.6 C) (Oral)   Resp 16   SpO2 98%   Physical Exam Vitals and nursing note reviewed.  Constitutional:      General: She is not in acute distress.    Appearance: Normal appearance. She is well-developed. She is not toxic-appearing.  HENT:     Head: Normocephalic and atraumatic.     Nose: Nose normal.  Eyes:     Extraocular Movements: Extraocular movements intact.     Conjunctiva/sclera: Conjunctivae normal.  Cardiovascular:     Rate and Rhythm: Normal rate and regular rhythm.     Pulses: Normal pulses.     Heart sounds: Normal heart sounds. No murmur heard. Pulmonary:     Effort: Pulmonary effort is normal. No respiratory distress.     Breath sounds: Normal breath sounds.  Abdominal:     Palpations: Abdomen is soft.     Tenderness: There is no abdominal tenderness.  Musculoskeletal:        General: No swelling.     Cervical back: Neck supple.  Skin:    General: Skin is warm and dry.     Capillary Refill: Capillary refill takes less than 2 seconds.  Neurological:     General: No focal deficit present.     Mental Status: She is alert and oriented to person, place, and time.  Psychiatric:        Mood and Affect: Mood normal.     (all labs ordered are listed, but only abnormal results are displayed) Labs Reviewed - No data to display  EKG: None  Radiology: No results found.   Procedures   Medications Ordered in the ED  acetaminophen  (TYLENOL ) tablet 650 mg (has no administration in time range)                                    Medical Decision Making  This patient presents to the ED for concern of bodyaches differential diagnosis includes malingering, body aches   Medicines ordered and prescription drug  management:  I ordered medication including Tylenol     I have reviewed the patients home medicines and have made adjustments as needed   Problem List / ED Course:  Considered for admission or further workup however patient's vital signs and physical exam are reassuring.  Patient is mildly hypertensive which is not uncommon per previous visits.  Patient educated on the importance of taking her hypertensive medications.  Patient given Tylenol  prior to discharge.  I feel patient is safe for discharge at this time.      Final diagnoses:  Generalized body aches    ED Discharge Orders     None          Francis Ileana LOISE DEVONNA 11/14/23 0036    Trine Raynell Moder, MD 11/14/23 [phone]

## 2023-11-16 ENCOUNTER — Emergency Department (HOSPITAL_COMMUNITY): Admission: EM | Admit: 2023-11-16 | Discharge: 2023-11-17 | Disposition: A | Discharge status: Home / Self Care

## 2023-11-16 ENCOUNTER — Other Ambulatory Visit: Payer: Self-pay

## 2023-11-16 ENCOUNTER — Encounter (HOSPITAL_COMMUNITY): Payer: Self-pay

## 2023-11-16 DIAGNOSIS — Z8673 Personal history of transient ischemic attack (TIA), and cerebral infarction without residual deficits: Secondary | ICD-10-CM | POA: Insufficient documentation

## 2023-11-16 DIAGNOSIS — E119 Type 2 diabetes mellitus without complications: Secondary | ICD-10-CM | POA: Insufficient documentation

## 2023-11-16 DIAGNOSIS — I10 Essential (primary) hypertension: Secondary | ICD-10-CM | POA: Diagnosis not present

## 2023-11-16 DIAGNOSIS — Z7984 Long term (current) use of oral hypoglycemic drugs: Secondary | ICD-10-CM | POA: Diagnosis not present

## 2023-11-16 DIAGNOSIS — R519 Headache, unspecified: Secondary | ICD-10-CM | POA: Diagnosis present

## 2023-11-16 DIAGNOSIS — M25519 Pain in unspecified shoulder: Secondary | ICD-10-CM | POA: Diagnosis present

## 2023-11-16 DIAGNOSIS — Z79899 Other long term (current) drug therapy: Secondary | ICD-10-CM | POA: Diagnosis not present

## 2023-11-16 DIAGNOSIS — W19XXXA Unspecified fall, initial encounter: Secondary | ICD-10-CM | POA: Diagnosis not present

## 2023-11-16 NOTE — ED Triage Notes (Signed)
 Pt reports generalized body pain x several years.

## 2023-11-17 ENCOUNTER — Other Ambulatory Visit: Payer: Self-pay

## 2023-11-17 ENCOUNTER — Encounter (HOSPITAL_COMMUNITY): Payer: Self-pay

## 2023-11-17 ENCOUNTER — Emergency Department (HOSPITAL_COMMUNITY)
Admission: EM | Admit: 2023-11-17 | Discharge: 2023-11-18 | Disposition: A | Discharge status: Home / Self Care | Source: Home / Self Care

## 2023-11-17 DIAGNOSIS — R519 Headache, unspecified: Secondary | ICD-10-CM | POA: Insufficient documentation

## 2023-11-17 MED ORDER — ACETAMINOPHEN 500 MG PO TABS
1000.0000 mg | ORAL_TABLET | Freq: Once | ORAL | Status: AC
  Administered 2023-11-17: 1000 mg via ORAL
  Filled 2023-11-17: qty 2

## 2023-11-17 NOTE — ED Provider Notes (Signed)
 [place] EMERGENCY DEPARTMENT AT [facility] Provider Note   CSN: [id number] Arrival date & time: 11/16/23  1932     Patient presents with: Shoulder Pain   [name] is a 78 y.o. female who presents for complaints of bodyaches.  This is now her 123rd visit in the past 6 months for similar complaint.  No new complaints.    Shoulder Pain  Past Medical History:  Diagnosis Date   Diabetes mellitus without complication (HCC)    GERD 09/04/2006   Qualifier: Diagnosis of  By: Lazaro Aquas     History of echocardiogram    a. 2D ECHO: 11/06/2013 EF 65%; no WMA. Mild TR. PA pk pressure 43 mm HG   Hypertension    Stroke [facility])    Past Surgical History:  Procedure Laterality Date   ABDOMINAL HYSTERECTOMY     ANTERIOR CERVICAL DECOMPRESSION/DISCECTOMY FUSION 4 LEVELS N/A 11/11/2019   Procedure: Cervical three-four Cervical four-five Cervical five-six Cervical six-seven  Anterior cervical decompression/discectomy/fusion;  Surgeon: Unice Pac, MD;  Location: [facility] OR;  Service: Neurosurgery;  Laterality: N/A;   LEFT HEART CATHETERIZATION WITH CORONARY ANGIOGRAM N/A 11/05/2013   Procedure: LEFT HEART CATHETERIZATION WITH CORONARY ANGIOGRAM;  Surgeon: Debby DELENA Sor, MD;  Location: [facility] CATH LAB;  Service: Cardiovascular;  Laterality: N/A;       Prior to Admission medications   Medication Sig Start Date End Date Taking? Authorizing Provider  acetaminophen  (TYLENOL ) 500 MG tablet Take 1 tablet (500 mg total) by mouth every 6 (six) hours as needed. 08/02/23   Elnor Jayson DELENA, DO  albuterol  (VENTOLIN  HFA) 108 (90 Base) MCG/ACT inhaler Inhale 1-2 puffs into the lungs every 6 (six) hours as needed for wheezing or shortness of breath. 04/22/20   Myrna Camelia HERO, NP  amLODipine  (NORVASC ) 10 MG tablet Take 1 tablet (10 mg total) by mouth daily. 01/23/23   Roemhildt, Lorin T, PA-C  amLODipine  (NORVASC ) 10 MG tablet Take 1 tablet (10 mg total) by mouth daily. 11/06/23   Francis Ileana SAILOR, PA-C   artificial tears ophthalmic solution Place 1 drop into both eyes as needed for dry eyes. 09/20/23   Jarold Olam HERO, PA-C  chlorthalidone  (HYGROTON ) 25 MG tablet TAKE 1 TABLET (25 MG TOTAL) BY MOUTH DAILY. 11/20/21 11/20/22  Paseda, Folashade R, FNP  cloNIDine  (CATAPRES ) 0.2 MG tablet Take 1 tablet (0.2 mg total) by mouth 2 (two) times daily. 08/26/23   Bauer, Collin S, PA-C  diclofenac  Sodium (VOLTAREN  ARTHRITIS PAIN) 1 % GEL Apply 2 g topically 3 (three) times daily as needed. 10/30/23   Hildegard Loge, PA-C  diphenhydrAMINE  (BENADRYL ) 25 MG tablet Take 1 tablet (25 mg total) by mouth every 6 (six) hours as needed for itching or allergies. 10/03/22   [name], PA-C  gabapentin  (NEURONTIN ) 100 MG capsule Take 1 capsule (100 mg total) by mouth 3 (three) times daily as needed. 04/30/23   Bero, Michael M, MD  hydrocortisone  cream 1 % Apply to affected area 2 times daily 09/22/23   Bauer, Collin S, PA-C  hydrOXYzine  (ATARAX ) 25 MG tablet Take 1 tablet (25 mg total) by mouth every 8 (eight) hours as needed. 11/23/22   Henderly, Britni A, PA-C  lidocaine  (LIDODERM ) 5 % Place 1 patch onto the skin daily. Remove & Discard patch within 12 hours or as directed by MD 08/02/23   Elnor Jayson DELENA, DO  loratadine  (CLARITIN ) 10 MG tablet Take 1 tablet (10 mg total) by mouth daily. 04/06/23   Barrett, Warren SAILOR,  PA-C  metFORMIN  (GLUCOPHAGE ) 500 MG tablet Take 1 tablet (500 mg total) by mouth 2 (two) times daily with a meal. 11/03/22 11/03/23  Robinson, John K, PA-C  metFORMIN  (GLUCOPHAGE ) 500 MG tablet Take 1 tablet (500 mg total) by mouth 2 (two) times daily with a meal. 11/06/23   Francis Ileana SAILOR, PA-C  methocarbamol  (ROBAXIN ) 500 MG tablet Take 1 tablet (500 mg total) by mouth 2 (two) times daily. 03/19/23   Ula Prentice SAUNDERS, MD  metoprolol  succinate (TOPROL -XL) 25 MG 24 hr tablet Take 1 tablet (25 mg total) by mouth daily. 06/18/23   Melvenia Motto, MD  metoprolol  succinate (TOPROL -XL) 25 MG 24 hr tablet Take 1 tablet (25 mg total) by  mouth daily. 11/06/23   Keith, Kayla N, PA-C  Multiple Vitamins-Minerals (MULTIVITAMIN WOMEN 50+) TABS Take 1 tablet by mouth daily. 10/15/23   Beverley Leita LABOR, PA-C  naproxen  (NAPROSYN ) 375 MG tablet Take 1 tablet (375 mg total) by mouth 2 (two) times daily as needed for moderate pain (pain score 4-6). 09/07/23   Sponseller, Pleasant SAUNDERS, PA-C  pantoprazole  (PROTONIX ) 20 MG tablet Take 1 tablet (20 mg total) by mouth daily. 10/10/21   Dean Clarity, MD  rosuvastatin  (CRESTOR ) 10 MG tablet Take 1 tablet (10 mg total) by mouth daily. 11/06/23   Keith, Kayla N, PA-C  triamcinolone  cream (KENALOG ) 0.1 % Apply 1 Application topically 2 (two) times daily. 07/07/23   Vicky Charleston, PA-C    Allergies: Codeine, Ibuprofen , and Imdur [isosorbide nitrate]    Review of Systems  Musculoskeletal:  Positive for myalgias.    Updated Vital Signs BP (!) 175/61 (BP Location: Left Arm)   Pulse (!) 56   Temp 98 F (36.7 C)   Resp 18   Ht 5' 4 (1.626 m)   Wt 55 kg   SpO2 100%   BMI 20.81 kg/m   Physical Exam Vitals and nursing note reviewed.  Constitutional:      General: She is not in acute distress.    Appearance: She is well-developed.  HENT:     Head: Normocephalic and atraumatic.  Eyes:     Conjunctiva/sclera: Conjunctivae normal.  Cardiovascular:     Rate and Rhythm: Normal rate and regular rhythm.     Heart sounds: No murmur heard. Pulmonary:     Effort: Pulmonary effort is normal. No respiratory distress.     Breath sounds: Normal breath sounds.  Abdominal:     Palpations: Abdomen is soft.     Tenderness: There is no abdominal tenderness.  Musculoskeletal:        General: No swelling.     Cervical back: Neck supple.  Skin:    General: Skin is warm and dry.     Capillary Refill: Capillary refill takes less than 2 seconds.  Neurological:     Mental Status: She is alert.  Psychiatric:        Mood and Affect: Mood normal.     (all labs ordered are listed, but only abnormal  results are displayed) Labs Reviewed - No data to display  EKG: None  Radiology: No results found.   Procedures   Medications Ordered in the ED  acetaminophen  (TYLENOL ) tablet 1,000 mg (1,000 mg Oral Given 11/17/23 0731)    Clinical Course as of 11/17/23 0732  Sun Nov 17, 2023  0731 Patient evaluated for body aches following a fall.  No new injury or trauma.  This is not on drainage or any third visit in the past 6  months for similar complaint.  She is hemodynamically stable.  She refuses care or anywhere else.  Do not feel that any repeat lab work and imaging is indicated today.  Provided dose of Tylenol  and discharged home. [JT]    Clinical Course User Index [JT] Donnajean Lynwood DEL, PA-C                                 Medical Decision Making  This patient presents to the ED with chief complaint(s) of myalgias .  The complaint involves an extensive differential diagnosis and also carries with it a high risk of complications and morbidity.   Pertinent past medical history as listed in HPI   Additional history obtained: Records reviewed Care Everywhere/External Records  Disposition:   Patient will be discharged home. The patient has been appropriately medically screened and/or stabilized in the ED. I have low suspicion for any other emergent medical condition which would require further screening, evaluation or treatment in the ED or require inpatient management. At time of discharge the patient is hemodynamically stable and in no acute distress. I have discussed work-up results and diagnosis with patient and answered all questions. Patient is agreeable with discharge plan. We discussed strict return precautions for returning to the emergency department and they verbalized understanding.      Social Determinants of Health:   none  This note was dictated with voice recognition software.  Despite best efforts at proofreading, errors may have occurred which can change the  documentation meaning.       Final diagnoses:  Fall, initial encounter    ED Discharge Orders     None          Donnajean Lynwood DEL DEVONNA 11/17/23 0732    Ula Prentice SAUNDERS, MD 11/18/23 [phone]

## 2023-11-17 NOTE — ED Triage Notes (Signed)
 Pt arrived via [facility] c/o headache 10/10 pain.

## 2023-11-17 NOTE — Discharge Instructions (Signed)
 You were evaluated in the emergency room for body aches.  You may use Tylenol  for pain.  Please follow-up with primary care doctor for further evaluation.

## 2023-11-18 MED ORDER — ACETAMINOPHEN 500 MG PO TABS
1000.0000 mg | ORAL_TABLET | Freq: Once | ORAL | Status: AC
Start: 2023-11-18 — End: 2023-11-18
  Administered 2023-11-18: 1000 mg via ORAL
  Filled 2023-11-18: qty 2

## 2023-11-18 NOTE — ED Provider Notes (Signed)
 [place] EMERGENCY DEPARTMENT AT [facility] Provider Note   CSN: [id number] Arrival date & time: 11/17/23  2302     Patient presents with: Headache   [name] is a 78 y.o. female.  Patient well-known to this department with 124 visits in the past 6 months presents via EMS complaining of a headache.  She states that her normal Tylenol  is not strong enough.  She denies other complaints at this time.  The headache is not described as sudden onset, worst of her life.  She denies any visual symptoms, dizziness, neck pain, fevers.    Headache      Prior to Admission medications   Medication Sig Start Date End Date Taking? Authorizing Provider  acetaminophen  (TYLENOL ) 500 MG tablet Take 1 tablet (500 mg total) by mouth every 6 (six) hours as needed. 08/02/23   Elnor Jayson LABOR, DO  albuterol  (VENTOLIN  HFA) 108 (90 Base) MCG/ACT inhaler Inhale 1-2 puffs into the lungs every 6 (six) hours as needed for wheezing or shortness of breath. 04/22/20   Myrna Camelia HERO, NP  amLODipine  (NORVASC ) 10 MG tablet Take 1 tablet (10 mg total) by mouth daily. 01/23/23   Roemhildt, Lorin T, PA-C  amLODipine  (NORVASC ) 10 MG tablet Take 1 tablet (10 mg total) by mouth daily. 11/06/23   Francis Ileana SAILOR, PA-C  artificial tears ophthalmic solution Place 1 drop into both eyes as needed for dry eyes. 09/20/23   Jarold Olam HERO, PA-C  chlorthalidone  (HYGROTON ) 25 MG tablet TAKE 1 TABLET (25 MG TOTAL) BY MOUTH DAILY. 11/20/21 11/20/22  Paseda, Folashade R, FNP  cloNIDine  (CATAPRES ) 0.2 MG tablet Take 1 tablet (0.2 mg total) by mouth 2 (two) times daily. 08/26/23   Bauer, Collin S, PA-C  diclofenac  Sodium (VOLTAREN  ARTHRITIS PAIN) 1 % GEL Apply 2 g topically 3 (three) times daily as needed. 10/30/23   Hildegard Loge, PA-C  diphenhydrAMINE  (BENADRYL ) 25 MG tablet Take 1 tablet (25 mg total) by mouth every 6 (six) hours as needed for itching or allergies. 10/03/22   [name], PA-C  gabapentin  (NEURONTIN ) 100 MG capsule  Take 1 capsule (100 mg total) by mouth 3 (three) times daily as needed. 04/30/23   Bero, Michael M, MD  hydrocortisone  cream 1 % Apply to affected area 2 times daily 09/22/23   Bauer, Collin S, PA-C  hydrOXYzine  (ATARAX ) 25 MG tablet Take 1 tablet (25 mg total) by mouth every 8 (eight) hours as needed. 11/23/22   Henderly, Britni A, PA-C  lidocaine  (LIDODERM ) 5 % Place 1 patch onto the skin daily. Remove & Discard patch within 12 hours or as directed by MD 08/02/23   Elnor Jayson LABOR, DO  loratadine  (CLARITIN ) 10 MG tablet Take 1 tablet (10 mg total) by mouth daily. 04/06/23   Barrett, Jamie N, PA-C  metFORMIN  (GLUCOPHAGE ) 500 MG tablet Take 1 tablet (500 mg total) by mouth 2 (two) times daily with a meal. 11/03/22 11/03/23  Lang Norleen POUR, PA-C  metFORMIN  (GLUCOPHAGE ) 500 MG tablet Take 1 tablet (500 mg total) by mouth 2 (two) times daily with a meal. 11/06/23   Francis Ileana SAILOR, PA-C  methocarbamol  (ROBAXIN ) 500 MG tablet Take 1 tablet (500 mg total) by mouth 2 (two) times daily. 03/19/23   Ula Prentice SAUNDERS, MD  metoprolol  succinate (TOPROL -XL) 25 MG 24 hr tablet Take 1 tablet (25 mg total) by mouth daily. 06/18/23   Melvenia Motto, MD  metoprolol  succinate (TOPROL -XL) 25 MG 24 hr tablet Take 1 tablet (25  mg total) by mouth daily. 11/06/23   Keith, Kayla N, PA-C  Multiple Vitamins-Minerals (MULTIVITAMIN WOMEN 50+) TABS Take 1 tablet by mouth daily. 10/15/23   Beverley Leita LABOR, PA-C  naproxen  (NAPROSYN ) 375 MG tablet Take 1 tablet (375 mg total) by mouth 2 (two) times daily as needed for moderate pain (pain score 4-6). 09/07/23   Sponseller, Pleasant R, PA-C  pantoprazole  (PROTONIX ) 20 MG tablet Take 1 tablet (20 mg total) by mouth daily. 10/10/21   Dean Clarity, MD  rosuvastatin  (CRESTOR ) 10 MG tablet Take 1 tablet (10 mg total) by mouth daily. 11/06/23   Francis Ileana SAILOR, PA-C  triamcinolone  cream (KENALOG ) 0.1 % Apply 1 Application topically 2 (two) times daily. 07/07/23   Vicky Charleston, PA-C    Allergies:  Codeine, Ibuprofen , and Imdur [isosorbide nitrate]    Review of Systems  Neurological:  Positive for headaches.    Updated Vital Signs BP (!) 170/49 (BP Location: Right Arm)   Pulse 63   Temp 98.1 F (36.7 C) (Oral)   Resp (!) 22   Ht 5' 4 (1.626 m)   Wt 55 kg   SpO2 100%   BMI 20.81 kg/m   Physical Exam Vitals and nursing note reviewed.  Constitutional:      General: She is not in acute distress.    Appearance: She is well-developed.  HENT:     Head: Normocephalic and atraumatic.  Eyes:     Conjunctiva/sclera: Conjunctivae normal.  Cardiovascular:     Rate and Rhythm: Normal rate and regular rhythm.     Heart sounds: No murmur heard. Pulmonary:     Effort: Pulmonary effort is normal. No respiratory distress.     Breath sounds: Normal breath sounds.  Abdominal:     Palpations: Abdomen is soft.     Tenderness: There is no abdominal tenderness.  Musculoskeletal:        General: No swelling.     Cervical back: Neck supple.  Skin:    General: Skin is warm and dry.     Capillary Refill: Capillary refill takes less than 2 seconds.  Neurological:     Mental Status: She is alert.     Cranial Nerves: No cranial nerve deficit.  Psychiatric:        Mood and Affect: Mood normal.     (all labs ordered are listed, but only abnormal results are displayed) Labs Reviewed - No data to display  EKG: None  Radiology: No results found.   Procedures   Medications Ordered in the ED  acetaminophen  (TYLENOL ) tablet 1,000 mg (has no administration in time range)                                    Medical Decision Making Risk OTC drugs.   This patient presents to the ED for concern of headache, this involves an extensive number of treatment options, and is a complaint that carries with it a high risk of complications and morbidity.  The differential diagnosis includes tension headache, cluster headache, migraines, intracranial abnormality, others   Co morbidities /  Chronic conditions that complicate the patient evaluation  Chronic pain   Additional history obtained:  Additional history obtained from EMR  Problem List / ED Course / Critical interventions / Medication management   I ordered medication including Tylenol  Reevaluation of the patient after these medicines showed that the patient improved I have reviewed the patients home  medicines and have made adjustments as needed   Test / Admission - Considered:  Patient with no red flag symptoms that would necessitate emergent head CT.  She is here multiple times weekly with chronic pain complaints.  Patient's pain responded to Tylenol .  Patient stable for discharge home at this time.  No indication for further emergent workup.      Final diagnoses:  Acute nonintractable headache, unspecified headache type    ED Discharge Orders     None          [name] 11/18/23 0124    Haze Lonni PARAS, MD 11/18/23 [phone]

## 2023-11-18 NOTE — Discharge Instructions (Signed)
 You were seen this evening for a headache.  Please continue to take your Tylenol  at home.  Return to the emergency department if you develop any life-threatening symptoms.

## 2023-11-19 ENCOUNTER — Encounter (HOSPITAL_COMMUNITY): Payer: Self-pay

## 2023-11-19 ENCOUNTER — Emergency Department (HOSPITAL_COMMUNITY)
Admission: EM | Admit: 2023-11-19 | Discharge: 2023-11-19 | Disposition: A | Discharge status: Home / Self Care | Attending: Emergency Medicine | Admitting: Emergency Medicine

## 2023-11-19 ENCOUNTER — Other Ambulatory Visit: Payer: Self-pay

## 2023-11-19 DIAGNOSIS — G8929 Other chronic pain: Secondary | ICD-10-CM | POA: Insufficient documentation

## 2023-11-19 MED ORDER — ACETAMINOPHEN 500 MG PO TABS
1000.0000 mg | ORAL_TABLET | ORAL | Status: AC
Start: 2023-11-19 — End: 2023-11-19
  Administered 2023-11-19: 1000 mg via ORAL
  Filled 2023-11-19: qty 2
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
## 2023-11-19 NOTE — ED Triage Notes (Signed)
 Pt c/o chronic generalized pain, 10/10

## 2023-11-19 NOTE — Discharge Instructions (Signed)
 Take Tylenol  1000 mg every 6 hours as needed for pain.  Please follow-up with the [facility].

## 2023-11-21 ENCOUNTER — Other Ambulatory Visit: Payer: Self-pay

## 2023-11-21 ENCOUNTER — Encounter (HOSPITAL_COMMUNITY): Payer: Self-pay

## 2023-11-21 ENCOUNTER — Emergency Department (HOSPITAL_COMMUNITY)
Admission: EM | Admit: 2023-11-21 | Discharge: 2023-11-21 | Disposition: A | Discharge status: Home / Self Care | Attending: Emergency Medicine | Admitting: Emergency Medicine

## 2023-11-21 DIAGNOSIS — Z7984 Long term (current) use of oral hypoglycemic drugs: Secondary | ICD-10-CM | POA: Insufficient documentation

## 2023-11-21 DIAGNOSIS — R52 Pain, unspecified: Secondary | ICD-10-CM | POA: Insufficient documentation

## 2023-11-21 DIAGNOSIS — W19XXXA Unspecified fall, initial encounter: Secondary | ICD-10-CM | POA: Diagnosis not present

## 2023-11-21 DIAGNOSIS — Z79899 Other long term (current) drug therapy: Secondary | ICD-10-CM | POA: Diagnosis not present

## 2023-11-21 MED ORDER — ACETAMINOPHEN 325 MG PO TABS
650.0000 mg | ORAL_TABLET | Freq: Once | ORAL | Status: AC
  Administered 2023-11-21: 650 mg via ORAL
  Filled 2023-11-21: qty 2

## 2023-11-21 NOTE — ED Triage Notes (Signed)
 QUICK TRIAGE: Pt ambulatory to ER for c/o generalized pain, no new injury.

## 2023-11-21 NOTE — Discharge Instructions (Signed)
 Exam and vitals reassuring today.  Continue to take Tylenol  at home for any continuing pain.  If pain worsens or new symptoms occur please return to the ED for further evaluation.
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
## 2023-11-30 ENCOUNTER — Encounter (HOSPITAL_COMMUNITY): Payer: Self-pay | Admitting: *Deleted

## 2023-11-30 ENCOUNTER — Emergency Department (HOSPITAL_COMMUNITY)
Admission: EM | Admit: 2023-11-30 | Discharge: 2023-11-30 | Disposition: A | Discharge status: Home / Self Care | Attending: Emergency Medicine | Admitting: Emergency Medicine

## 2023-11-30 ENCOUNTER — Other Ambulatory Visit: Payer: Self-pay

## 2023-11-30 DIAGNOSIS — M791 Myalgia, unspecified site: Secondary | ICD-10-CM | POA: Diagnosis present

## 2023-11-30 DIAGNOSIS — M542 Cervicalgia: Secondary | ICD-10-CM | POA: Diagnosis not present

## 2023-11-30 MED ORDER — METHOCARBAMOL 500 MG PO TABS: 500.0000 mg | ORAL_TABLET | Freq: Three times a day (TID) | ORAL | 0 refills | Status: DC | PRN

## 2023-11-30 MED ORDER — LIDOCAINE 5 % EX PTCH
1.0000 | MEDICATED_PATCH | CUTANEOUS | Status: DC
  Administered 2023-11-30: 1 via TRANSDERMAL
  Filled 2023-11-30: qty 1

## 2023-11-30 MED ORDER — ACETAMINOPHEN 325 MG PO TABS
650.0000 mg | ORAL_TABLET | Freq: Once | ORAL | Status: AC
  Administered 2023-11-30: 650 mg via ORAL
  Filled 2023-11-30: qty 2

## 2023-11-30 MED ORDER — METHOCARBAMOL 500 MG PO TABS
500.0000 mg | ORAL_TABLET | Freq: Once | ORAL | Status: AC
  Administered 2023-11-30: 500 mg via ORAL
  Filled 2023-11-30: qty 1

## 2023-11-30 MED ORDER — LIDOCAINE 5 % EX PTCH: 1.0000 | MEDICATED_PATCH | CUTANEOUS | 0 refills | Status: DC

## 2023-11-30 NOTE — Discharge Instructions (Signed)
 As we discussed, you have chronic pain after fall  You can continue taking Tylenol  and I have prescribed lidocaine  patch and Robaxin  as needed  Please follow-up with your doctor  Return to ER if you have worse back pain or neck pain or trouble walking

## 2023-11-30 NOTE — ED Triage Notes (Signed)
 BODY PAIN TODAY

## 2023-11-30 NOTE — ED Triage Notes (Signed)
 Pt reports generalized body pain, taken tylenol  without relief
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
## 2023-12-03 ENCOUNTER — Emergency Department (HOSPITAL_COMMUNITY)
Admission: EM | Admit: 2023-12-03 | Discharge: 2023-12-04 | Disposition: A | Discharge status: Home / Self Care | Attending: Emergency Medicine | Admitting: Emergency Medicine

## 2023-12-03 ENCOUNTER — Encounter (HOSPITAL_COMMUNITY): Payer: Self-pay

## 2023-12-03 ENCOUNTER — Other Ambulatory Visit: Payer: Self-pay

## 2023-12-03 DIAGNOSIS — M791 Myalgia, unspecified site: Secondary | ICD-10-CM | POA: Insufficient documentation

## 2023-12-03 DIAGNOSIS — Z760 Encounter for issue of repeat prescription: Secondary | ICD-10-CM | POA: Diagnosis not present

## 2023-12-03 MED ORDER — LIDOCAINE 5 % EX PTCH
1.0000 | MEDICATED_PATCH | CUTANEOUS | Status: DC
  Administered 2023-12-03: 1 via TRANSDERMAL
  Filled 2023-12-03: qty 1

## 2023-12-03 NOTE — ED Triage Notes (Signed)
 Pt states she needs a refill for her lidocaine  patch 5%. Pt c/o generalized pain.

## 2023-12-03 NOTE — ED Provider Triage Note (Signed)
 Emergency Medicine Provider Triage Evaluation Note  [name] , a 78 y.o. female  was evaluated in triage.  Pt complains of needing lidocaine  patch refill and new onset blurry vision that has been going on for 2 weeks. Patient has had extensive neuro workup previously per nursing and chart review. Patient also had ED care plan with numerous visits.   Review of Systems  Positive: Blurry vision, left hip pain Negative: Chest pain, shortness of breath  Physical Exam  BP (!) 193/66   Pulse 66   Temp 97.9 F (36.6 C)   Resp 18   Ht 5' 4 (1.626 m)   Wt 55 kg   SpO2 99%   BMI 20.81 kg/m  Gen:   Awake, no distress   Resp:  Normal effort  MSK:   Moves extremities without difficulty  Other:  Vision grossly intact in triage, PERRL, EOMI  Medical Decision Making  Medically screening exam initiated at 2:25 PM.  Appropriate orders placed.  [name] was informed that the remainder of the evaluation will be completed by another provider, this initial triage assessment does not replace that evaluation, and the importance of remaining in the ED until their evaluation is complete.  Orders: lidocaine  patch, declined labs in triage as patient has had numerous visits recently with lab work completed and has had numerous imaging studies completed   Adriana Terrall FALCON, PA-C 12/03/23 1431

## 2023-12-03 NOTE — Discharge Instructions (Signed)
 It was a pleasure taking care of you today.  Today your lidocaine  patches were refilled and sent into the pharmacy, please pick them up from the pharmacy and use as prescribed.  If you experience any of the following symptoms including but not limited to chest pain, shortness of breath, blurry vision, unexplained weakness,

## 2023-12-04 MED ORDER — ADULT MULTIVITAMIN W/MINERALS CH
1.0000 | ORAL_TABLET | Freq: Once | ORAL | Status: AC
  Administered 2023-12-04: 1 via ORAL
  Filled 2023-12-04: qty 1
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
## 2023-12-07 ENCOUNTER — Other Ambulatory Visit: Payer: Self-pay

## 2023-12-07 ENCOUNTER — Emergency Department (HOSPITAL_COMMUNITY)
Admission: EM | Admit: 2023-12-07 | Discharge: 2023-12-08 | Disposition: A | Discharge status: Home / Self Care | Attending: Emergency Medicine | Admitting: Emergency Medicine

## 2023-12-07 ENCOUNTER — Encounter (HOSPITAL_COMMUNITY): Payer: Self-pay | Admitting: *Deleted

## 2023-12-07 DIAGNOSIS — L299 Pruritus, unspecified: Secondary | ICD-10-CM | POA: Diagnosis present
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
## 2023-12-11 ENCOUNTER — Other Ambulatory Visit: Payer: Self-pay

## 2023-12-11 ENCOUNTER — Emergency Department (HOSPITAL_COMMUNITY)
Admission: EM | Admit: 2023-12-11 | Discharge: 2023-12-12 | Disposition: A | Discharge status: Home / Self Care | Attending: Emergency Medicine | Admitting: Emergency Medicine

## 2023-12-11 ENCOUNTER — Encounter (HOSPITAL_COMMUNITY): Payer: Self-pay | Admitting: Emergency Medicine

## 2023-12-11 DIAGNOSIS — H9203 Otalgia, bilateral: Secondary | ICD-10-CM | POA: Diagnosis not present

## 2023-12-11 DIAGNOSIS — I10 Essential (primary) hypertension: Secondary | ICD-10-CM | POA: Diagnosis not present

## 2023-12-11 DIAGNOSIS — G8929 Other chronic pain: Secondary | ICD-10-CM | POA: Insufficient documentation

## 2023-12-11 DIAGNOSIS — Z7984 Long term (current) use of oral hypoglycemic drugs: Secondary | ICD-10-CM | POA: Diagnosis not present

## 2023-12-11 DIAGNOSIS — E119 Type 2 diabetes mellitus without complications: Secondary | ICD-10-CM | POA: Diagnosis not present

## 2023-12-11 DIAGNOSIS — M791 Myalgia, unspecified site: Secondary | ICD-10-CM | POA: Diagnosis not present

## 2023-12-11 DIAGNOSIS — Z79899 Other long term (current) drug therapy: Secondary | ICD-10-CM | POA: Diagnosis not present

## 2023-12-11 DIAGNOSIS — Z760 Encounter for issue of repeat prescription: Secondary | ICD-10-CM | POA: Diagnosis present
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
## 2023-12-11 NOTE — ED Notes (Signed)
 Pt refused resp swab, states I ain't got that

## 2023-12-11 NOTE — ED Provider Triage Note (Signed)
 Emergency Medicine Provider Triage Evaluation Note  [name] , a 78 y.o. female  was evaluated in triage.  Pt complains of chronic pain and bilateral otalgia.  Review of Systems  Positive: Otalgia, chronic pain Negative: Nausea, vomiting, fever, chills  Physical Exam  BP (!) 190/77 (BP Location: Right Arm)   Pulse 63   Temp 98.3 F (36.8 C)   Resp 17   Ht 5' 4 (1.626 m)   Wt 55 kg   SpO2 100%   BMI 20.81 kg/m  Gen:   Awake, no distress   Resp:  Normal effort  MSK:   Moves extremities without difficulty  Other:    Medical Decision Making  Medically screening exam initiated at 6:53 PM.  Appropriate orders placed.  [name] was informed that the remainder of the evaluation will be completed by another provider, this initial triage assessment does not replace that evaluation, and the importance of remaining in the ED until their evaluation is complete.  Resp panel ordered   Adriana Ileana SAILOR, PA-C 12/11/23 8145

## 2023-12-12 ENCOUNTER — Emergency Department (HOSPITAL_COMMUNITY)
Admission: EM | Admit: 2023-12-12 | Discharge: 2023-12-13 | Disposition: A | Discharge status: Home / Self Care | Source: Home / Self Care | Attending: Emergency Medicine | Admitting: Emergency Medicine

## 2023-12-12 DIAGNOSIS — Z760 Encounter for issue of repeat prescription: Secondary | ICD-10-CM | POA: Insufficient documentation

## 2023-12-12 DIAGNOSIS — I10 Essential (primary) hypertension: Secondary | ICD-10-CM | POA: Insufficient documentation

## 2023-12-12 DIAGNOSIS — Z7984 Long term (current) use of oral hypoglycemic drugs: Secondary | ICD-10-CM | POA: Insufficient documentation

## 2023-12-12 DIAGNOSIS — E119 Type 2 diabetes mellitus without complications: Secondary | ICD-10-CM | POA: Insufficient documentation

## 2023-12-12 DIAGNOSIS — M791 Myalgia, unspecified site: Secondary | ICD-10-CM | POA: Insufficient documentation

## 2023-12-12 DIAGNOSIS — Z79899 Other long term (current) drug therapy: Secondary | ICD-10-CM | POA: Insufficient documentation

## 2023-12-12 DIAGNOSIS — G8929 Other chronic pain: Secondary | ICD-10-CM | POA: Diagnosis not present

## 2023-12-12 MED ORDER — AMLODIPINE BESYLATE 5 MG PO TABS
10.0000 mg | ORAL_TABLET | Freq: Once | ORAL | Status: AC
  Administered 2023-12-12: 10 mg via ORAL
  Filled 2023-12-12: qty 2

## 2023-12-12 MED ORDER — ACETAMINOPHEN 500 MG PO TABS
1000.0000 mg | ORAL_TABLET | Freq: Once | ORAL | Status: AC
  Administered 2023-12-12: 1000 mg via ORAL
  Filled 2023-12-12: qty 2
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
## 2023-12-12 NOTE — Discharge Instructions (Addendum)
 I have provided you with the contact information for [facility], please contact their office to establish care since you do not have a PCP locally.  Continue Tylenol  and lidocaine  patches as needed for pain.

## 2023-12-12 NOTE — ED Provider Triage Note (Signed)
 Emergency Medicine Provider Triage Evaluation Note  [name] , a 78 y.o. female  was evaluated in triage.  Pt complains of medication refill.  Was just seen earlier today.  Patient had 16-month supply sent in by myself approximately 1 month ago  Review of Systems  Positive: None Negative: None  Physical Exam  BP (!) 177/101 (BP Location: Right Arm)   Pulse 67   Temp 97.8 F (36.6 C)   Resp 16   SpO2 100%  Gen:   Awake, no distress, wearing soft collar as patient always does for comfort Resp:  Normal effort  MSK:   Moves extremities without difficulty  Other:    Medical Decision Making  Medically screening exam initiated at 3:28 PM.  Appropriate orders placed.  [name] was informed that the remainder of the evaluation will be completed by another provider, this initial triage assessment does not replace that evaluation, and the importance of remaining in the ED until their evaluation is complete.     Adriana Ileana SAILOR, PA-C 12/12/23 1529

## 2023-12-12 NOTE — ED Triage Notes (Signed)
 Patient has brought her entire bag of bottles of medication and said [facility] told me I needed to bring all these bottles to my doc to get them refilled'.  Another reason this patient needs placement or a primary care doctor/

## 2023-12-13 ENCOUNTER — Encounter (HOSPITAL_COMMUNITY): Payer: Self-pay

## 2023-12-13 ENCOUNTER — Emergency Department (HOSPITAL_COMMUNITY)
Admission: EM | Admit: 2023-12-13 | Discharge: 2023-12-13 | Disposition: A | Discharge status: Home / Self Care | Attending: Emergency Medicine | Admitting: Emergency Medicine

## 2023-12-13 DIAGNOSIS — M791 Myalgia, unspecified site: Secondary | ICD-10-CM | POA: Insufficient documentation

## 2023-12-13 DIAGNOSIS — G8929 Other chronic pain: Secondary | ICD-10-CM | POA: Diagnosis not present

## 2023-12-13 DIAGNOSIS — R52 Pain, unspecified: Secondary | ICD-10-CM

## 2023-12-13 MED ORDER — AMLODIPINE BESYLATE 10 MG PO TABS: 10.0000 mg | ORAL_TABLET | Freq: Every day | ORAL | 0 refills | Status: DC

## 2023-12-13 MED ORDER — METOPROLOL SUCCINATE ER 25 MG PO TB24
25.0000 mg | ORAL_TABLET | Freq: Once | ORAL | Status: AC
  Administered 2023-12-13: 25 mg via ORAL
  Filled 2023-12-13: qty 1

## 2023-12-13 MED ORDER — AMLODIPINE BESYLATE 5 MG PO TABS
10.0000 mg | ORAL_TABLET | Freq: Once | ORAL | Status: AC
  Administered 2023-12-13: 10 mg via ORAL
  Filled 2023-12-13: qty 2

## 2023-12-13 MED ORDER — METOPROLOL SUCCINATE ER 25 MG PO TB24: 25.0000 mg | ORAL_TABLET | Freq: Every day | ORAL | 0 refills | Status: DC

## 2023-12-13 MED ORDER — ROSUVASTATIN CALCIUM 10 MG PO TABS: 10.0000 mg | ORAL_TABLET | Freq: Every day | ORAL | 0 refills | Status: DC

## 2023-12-13 MED ORDER — METFORMIN HCL 500 MG PO TABS: 500.0000 mg | ORAL_TABLET | Freq: Two times a day (BID) | ORAL | 0 refills | Status: AC

## 2023-12-13 MED ORDER — ACETAMINOPHEN 325 MG PO TABS
650.0000 mg | ORAL_TABLET | Freq: Once | ORAL | Status: AC
  Administered 2023-12-13: 650 mg via ORAL
  Filled 2023-12-13: qty 2
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
## 2023-12-13 NOTE — ED Notes (Signed)
 Pt ambulated to restroom with steady gait.

## 2023-12-13 NOTE — ED Notes (Signed)
 Pt refused vital signs.
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
## 2023-12-13 NOTE — Discharge Instructions (Signed)
 Your medication refills have been sent to your pharmacy below. Return to the ER with any new severe symptoms.
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
## 2023-12-13 NOTE — Discharge Instructions (Addendum)
 Today you were seen for body aches.  You may take Tylenol  as needed for pain.  Thank you for letting us  treat you today. After performing a physical exam, I feel you are safe to go home. Please follow up with your PCP in the next several days and provide them with your records from this visit. Return to the Emergency Room if pain becomes severe or symptoms worsen.

## 2023-12-18 ENCOUNTER — Encounter (HOSPITAL_COMMUNITY): Payer: Self-pay

## 2023-12-18 ENCOUNTER — Other Ambulatory Visit: Payer: Self-pay

## 2023-12-18 ENCOUNTER — Emergency Department (HOSPITAL_COMMUNITY): Admission: EM | Admit: 2023-12-18 | Discharge: 2023-12-18 | Disposition: A | Discharge status: Home / Self Care

## 2023-12-18 DIAGNOSIS — G8929 Other chronic pain: Secondary | ICD-10-CM | POA: Insufficient documentation

## 2023-12-18 DIAGNOSIS — Z760 Encounter for issue of repeat prescription: Secondary | ICD-10-CM | POA: Insufficient documentation

## 2023-12-18 DIAGNOSIS — I10 Essential (primary) hypertension: Secondary | ICD-10-CM | POA: Insufficient documentation

## 2023-12-18 DIAGNOSIS — Z79899 Other long term (current) drug therapy: Secondary | ICD-10-CM | POA: Diagnosis not present

## 2023-12-18 DIAGNOSIS — Z5900 Homelessness unspecified: Secondary | ICD-10-CM | POA: Diagnosis not present

## 2023-12-18 DIAGNOSIS — E119 Type 2 diabetes mellitus without complications: Secondary | ICD-10-CM | POA: Insufficient documentation

## 2023-12-18 DIAGNOSIS — M791 Myalgia, unspecified site: Secondary | ICD-10-CM | POA: Diagnosis not present

## 2023-12-18 MED ORDER — ACETAMINOPHEN 325 MG PO TABS
650.0000 mg | ORAL_TABLET | Freq: Once | ORAL | Status: DC
  Filled 2023-12-18: qty 2

## 2023-12-18 MED ORDER — NAPROXEN 250 MG PO TABS
375.0000 mg | ORAL_TABLET | Freq: Once | ORAL | Status: DC
  Filled 2023-12-18: qty 2

## 2023-12-18 MED ORDER — AMLODIPINE BESYLATE 10 MG PO TABS: 10.0000 mg | ORAL_TABLET | Freq: Every day | ORAL | 0 refills | Status: DC

## 2023-12-18 MED ORDER — METOPROLOL SUCCINATE ER 25 MG PO TB24: 25.0000 mg | ORAL_TABLET | Freq: Every day | ORAL | 0 refills | Status: DC

## 2023-12-18 NOTE — Discharge Instructions (Signed)
 Please pick up blood pressure medications that have [facility] for load as prescribed.  Provided you with Tylenol , naproxen  here Emergency Department for chronic pain.  Return to emerged from if you experience chest pain, shortness of breath, loss of vision
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
## 2023-12-21 ENCOUNTER — Other Ambulatory Visit: Payer: Self-pay

## 2023-12-21 ENCOUNTER — Encounter (HOSPITAL_COMMUNITY): Payer: Self-pay

## 2023-12-21 ENCOUNTER — Emergency Department (HOSPITAL_COMMUNITY)
Admission: EM | Admit: 2023-12-21 | Discharge: 2023-12-21 | Disposition: A | Discharge status: Home / Self Care | Attending: Emergency Medicine | Admitting: Emergency Medicine

## 2023-12-21 ENCOUNTER — Emergency Department (HOSPITAL_COMMUNITY): Admission: EM | Admit: 2023-12-21 | Discharge: 2023-12-21 | Disposition: A | Discharge status: Home / Self Care

## 2023-12-21 DIAGNOSIS — Z7984 Long term (current) use of oral hypoglycemic drugs: Secondary | ICD-10-CM | POA: Diagnosis not present

## 2023-12-21 DIAGNOSIS — G8929 Other chronic pain: Secondary | ICD-10-CM | POA: Insufficient documentation

## 2023-12-21 DIAGNOSIS — Z79899 Other long term (current) drug therapy: Secondary | ICD-10-CM | POA: Insufficient documentation

## 2023-12-21 DIAGNOSIS — Z760 Encounter for issue of repeat prescription: Secondary | ICD-10-CM | POA: Diagnosis present

## 2023-12-21 DIAGNOSIS — R52 Pain, unspecified: Secondary | ICD-10-CM

## 2023-12-21 DIAGNOSIS — I10 Essential (primary) hypertension: Secondary | ICD-10-CM | POA: Insufficient documentation

## 2023-12-21 DIAGNOSIS — M791 Myalgia, unspecified site: Secondary | ICD-10-CM | POA: Diagnosis present

## 2023-12-21 DIAGNOSIS — E119 Type 2 diabetes mellitus without complications: Secondary | ICD-10-CM | POA: Insufficient documentation

## 2023-12-21 DIAGNOSIS — M542 Cervicalgia: Secondary | ICD-10-CM | POA: Insufficient documentation

## 2023-12-21 DIAGNOSIS — Z8673 Personal history of transient ischemic attack (TIA), and cerebral infarction without residual deficits: Secondary | ICD-10-CM | POA: Insufficient documentation

## 2023-12-21 MED ORDER — ACETAMINOPHEN 500 MG PO TABS
1000.0000 mg | ORAL_TABLET | Freq: Once | ORAL | Status: AC
  Administered 2023-12-21: 1000 mg via ORAL
  Filled 2023-12-21: qty 2

## 2023-12-21 MED ORDER — IBUPROFEN 800 MG PO TABS
800.0000 mg | ORAL_TABLET | Freq: Once | ORAL | Status: AC
  Administered 2023-12-21: 800 mg via ORAL
  Filled 2023-12-21: qty 1

## 2023-12-21 MED ORDER — METOPROLOL SUCCINATE ER 25 MG PO TB24: 25.0000 mg | ORAL_TABLET | Freq: Every day | ORAL | 0 refills | Status: DC

## 2023-12-21 MED ORDER — AMLODIPINE BESYLATE 10 MG PO TABS: 10.0000 mg | ORAL_TABLET | Freq: Every day | ORAL | 0 refills | Status: DC
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
## 2023-12-21 NOTE — ED Provider Notes (Signed)
  EMERGENCY DEPARTMENT AT [facility] Provider Note   CSN: [id number] Arrival date & time: 12/21/23  1214     Patient presents with: Generalized Body Aches   [name] is a 78 y.o. female.   78 year old female presents for evaluation of losing her prescription.  She states a few days ago somebody gave her a paper with 3 medicines on it.  She states she has lost it but she does not know what these medicines were for.  On review of her chart her most recent prescriptions were for her blood pressure medications.  She states she still has her Robaxin  and other pain medications at home.  She is complaining of chronic pain.  Of note patient has frequent emergency department visits for similar complaints.        Prior to Admission medications   Medication Sig Start Date End Date Taking? Authorizing Provider  amLODipine  (NORVASC ) 10 MG tablet Take 1 tablet (10 mg total) by mouth daily. 12/21/23  Yes Clennon Nasca L, DO  metoprolol  succinate (TOPROL -XL) 25 MG 24 hr tablet Take 1 tablet (25 mg total) by mouth daily. 12/21/23  Yes Lon Klippel L, DO  acetaminophen  (TYLENOL ) 500 MG tablet Take 1 tablet (500 mg total) by mouth every 6 (six) hours as needed. 08/02/23   Elnor Jayson LABOR, DO  albuterol  (VENTOLIN  HFA) 108 (90 Base) MCG/ACT inhaler Inhale 1-2 puffs into the lungs every 6 (six) hours as needed for wheezing or shortness of breath. 04/22/20   Myrna Camelia HERO, NP  amLODipine  (NORVASC ) 10 MG tablet Take 1 tablet (10 mg total) by mouth daily. 12/18/23 01/17/24  Minnie Tinnie BRAVO, PA  artificial tears ophthalmic solution Place 1 drop into both eyes as needed for dry eyes. 09/20/23   Jarold Olam HERO, PA-C  chlorthalidone  (HYGROTON ) 25 MG tablet TAKE 1 TABLET (25 MG TOTAL) BY MOUTH DAILY. 11/20/21 11/20/22  Paseda, Folashade R, FNP  cloNIDine  (CATAPRES ) 0.2 MG tablet Take 1 tablet (0.2 mg total) by mouth 2 (two) times daily. 08/26/23   Bauer, Collin S, PA-C  diclofenac  Sodium  (VOLTAREN  ARTHRITIS PAIN) 1 % GEL Apply 2 g topically 3 (three) times daily as needed. 10/30/23   Hildegard, Amjad, PA-C  diphenhydrAMINE  (BENADRYL ) 25 MG tablet Take 1 tablet (25 mg total) by mouth every 6 (six) hours as needed for itching or allergies. 10/03/22   [name], PA-C  gabapentin  (NEURONTIN ) 100 MG capsule Take 1 capsule (100 mg total) by mouth 3 (three) times daily as needed. 04/30/23   Bero, Michael M, MD  hydrocortisone  cream 1 % Apply to affected area 2 times daily 09/22/23   Bauer, Collin S, PA-C  hydrOXYzine  (ATARAX ) 25 MG tablet Take 1 tablet (25 mg total) by mouth every 8 (eight) hours as needed. 11/23/22   Henderly, Britni A, PA-C  lidocaine  (LIDODERM ) 5 % Place 1 patch onto the skin daily. Remove & Discard patch within 12 hours or as directed by MD 11/30/23   Patt Alm Macho, MD  loratadine  (CLARITIN ) 10 MG tablet Take 1 tablet (10 mg total) by mouth daily. 04/06/23   Barrett, Warren SAILOR, PA-C  metFORMIN  (GLUCOPHAGE ) 500 MG tablet Take 1 tablet (500 mg total) by mouth 2 (two) times daily with a meal. 12/13/23 01/12/24  Sponseller, Rebekah R, PA-C  methocarbamol  (ROBAXIN ) 500 MG tablet Take 1 tablet (500 mg total) by mouth every 8 (eight) hours as needed for muscle spasms. 11/30/23   Patt Alm Macho, MD  metoprolol  succinate (TOPROL -XL)  25 MG 24 hr tablet Take 1 tablet (25 mg total) by mouth daily. 12/18/23 01/17/24  Minnie Tinnie BRAVO, PA  Multiple Vitamins-Minerals (MULTIVITAMIN WOMEN 50+) TABS Take 1 tablet by mouth daily. 10/15/23   Beverley Leita LABOR, PA-C  naproxen  (NAPROSYN ) 375 MG tablet Take 1 tablet (375 mg total) by mouth 2 (two) times daily as needed for moderate pain (pain score 4-6). 09/07/23   Sponseller, Pleasant R, PA-C  pantoprazole  (PROTONIX ) 20 MG tablet Take 1 tablet (20 mg total) by mouth daily. 10/10/21   Dean Clarity, MD  rosuvastatin  (CRESTOR ) 10 MG tablet Take 1 tablet (10 mg total) by mouth at bedtime. 12/13/23   Sponseller, Rebekah R, PA-C  triamcinolone  cream (KENALOG )  0.1 % Apply 1 Application topically 2 (two) times daily. 07/07/23   Vicky Charleston, PA-C    Allergies: Codeine, Ibuprofen , and Imdur [isosorbide nitrate]    Review of Systems  Constitutional:  Negative for chills and fever.  HENT:  Negative for ear pain and sore throat.   Eyes:  Negative for pain and visual disturbance.  Respiratory:  Negative for cough and shortness of breath.   Cardiovascular:  Negative for chest pain and palpitations.  Gastrointestinal:  Negative for abdominal pain and vomiting.  Genitourinary:  Negative for dysuria and hematuria.  Musculoskeletal:  Positive for neck pain. Negative for arthralgias and back pain.  Skin:  Negative for color change and rash.  Neurological:  Negative for seizures and syncope.  All other systems reviewed and are negative.   Updated Vital Signs BP (!) 208/63 (BP Location: Right Arm)   Pulse (!) 57   Temp (!) 97.5 F (36.4 C)   Resp 17   SpO2 100%   Physical Exam Vitals and nursing note reviewed.  Constitutional:      General: She is not in acute distress.    Appearance: Normal appearance. She is well-developed. She is not ill-appearing.  HENT:     Head: Normocephalic and atraumatic.  Eyes:     Conjunctiva/sclera: Conjunctivae normal.  Cardiovascular:     Rate and Rhythm: Normal rate and regular rhythm.     Heart sounds: No murmur heard. Pulmonary:     Effort: Pulmonary effort is normal. No respiratory distress.     Breath sounds: Normal breath sounds.  Abdominal:     Palpations: Abdomen is soft.     Tenderness: There is no abdominal tenderness.  Musculoskeletal:        General: No swelling.     Cervical back: Neck supple.  Skin:    General: Skin is warm and dry.     Capillary Refill: Capillary refill takes less than 2 seconds.  Neurological:     Mental Status: She is alert.  Psychiatric:        Mood and Affect: Mood normal.     (all labs ordered are listed, but only abnormal results are displayed) Labs  Reviewed - No data to display  EKG: None  Radiology: No results found.   Procedures   Medications Ordered in the ED  ibuprofen  (ADVIL ) tablet 800 mg (has no administration in time range)                                    Medical Decision Making Social determinants of health: Frequent ER visits, homelessness  Patient here for medication refill.  Will refill her antihypertensive medicines.  Declined prescription for muscle relaxer states she still  has lidocaine  patches.  I will give her ibuprofen  here.  Advised ibuprofen  and Tylenol  as needed for pain, to take her antihypertensive daily and obtain follow up with her primary care doctor. She feels comfortable with the plan to be discharged home. Advised to return for any new or worsening symptoms.   Problems Addressed: Chronic neck pain: chronic illness or injury Medication refill: self-limited or minor problem  Amount and/or Complexity of Data Reviewed External Data Reviewed: notes.    Details: Prior ED visit from 11-26 reviewed and patient have her antihypertensives refilled  Risk OTC drugs. Prescription drug management. Diagnosis or treatment significantly limited by social determinants of health.     Final diagnoses:  Medication refill  Chronic neck pain    ED Discharge Orders          Ordered    metoprolol  succinate (TOPROL -XL) 25 MG 24 hr tablet  Daily        12/21/23 1227    amLODipine  (NORVASC ) 10 MG tablet  Daily        12/21/23 1227               Harland Aguiniga, Duwaine L, DO 12/21/23 1230

## 2023-12-21 NOTE — ED Notes (Signed)
Pt provided applesauce and juice.  
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
## 2023-12-21 NOTE — ED Provider Notes (Signed)
 [place] EMERGENCY DEPARTMENT AT [facility] Provider Note  CSN: [id number] Arrival date & time: 12/21/23 1816  Chief Complaint(s) Generalized Body Aches  HPI [name] is a 78 y.o. 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  Patient reports generalized bodyaches after falling in the past years ago.  She reports this is something she deals with every day.  She is not taking anything for it at home.  She denies fevers, chills, chest pain, shortness of breath, abdominal pain, lightheadedness, dizziness, other new symptoms.   Past Medical History Past Medical History:  Diagnosis Date   Diabetes mellitus without complication (HCC)    GERD 09/04/2006   Qualifier: Diagnosis of  By: Lazaro Aquas     History of echocardiogram    a. 2D ECHO: 11/06/2013 EF 65%; no WMA. Mild TR. PA pk pressure 43 mm HG   Hypertension    Stroke [facility])    Patient Active Problem List   Diagnosis Date Noted   Moderate cognitive impairment 06/16/2021   Chronic headache 06/16/2021   Healthcare maintenance 06/16/2021   Frequent patient in emergency department 06/16/2021   Cervical stenosis of spine 11/10/2019   Type 2 diabetes mellitus with hyperlipidemia (HCC) 06/20/2012   History of stroke 06/20/2012   Essential hypertension 09/04/2006   Osteoarthritis 09/04/2006   Home Medication(s) Prior to Admission medications   Medication Sig Start Date End Date Taking? Authorizing Provider  acetaminophen  (TYLENOL ) 500 MG tablet Take 1 tablet (500 mg total) by mouth every 6 (six) hours as needed. 08/02/23   Elnor Jayson LABOR, DO  albuterol  (VENTOLIN  HFA) 108 (90 Base) MCG/ACT inhaler Inhale 1-2 puffs into the lungs every 6 (six) hours as needed for wheezing or shortness of breath. 04/22/20   Myrna Camelia HERO, NP  amLODipine  (NORVASC ) 10 MG tablet Take 1 tablet (10 mg total) by mouth daily. 12/18/23 01/17/24  Minnie Tinnie BRAVO, PA  amLODipine  (NORVASC ) 10 MG tablet  Take 1 tablet (10 mg total) by mouth daily. 12/21/23   Kammerer, Megan L, DO  artificial tears ophthalmic solution Place 1 drop into both eyes as needed for dry eyes. 09/20/23   Jarold Olam HERO, PA-C  chlorthalidone  (HYGROTON ) 25 MG tablet TAKE 1 TABLET (25 MG TOTAL) BY MOUTH DAILY. 11/20/21 11/20/22  Paseda, Folashade R, FNP  cloNIDine  (CATAPRES ) 0.2 MG tablet Take 1 tablet (0.2 mg total) by mouth 2 (two) times daily. 08/26/23   Bauer, Collin S, PA-C  diclofenac  Sodium (VOLTAREN  ARTHRITIS PAIN) 1 % GEL Apply 2 g topically 3 (three) times daily as needed. 10/30/23   Hildegard Loge, PA-C  diphenhydrAMINE  (BENADRYL ) 25 MG tablet Take 1 tablet (25 mg total) by mouth every 6 (six) hours as needed for itching or allergies. 10/03/22   [name], PA-C  gabapentin  (NEURONTIN ) 100 MG capsule Take 1 capsule (100 mg total) by mouth 3 (three) times daily as needed. 04/30/23   Bero, Michael M, MD  hydrocortisone  cream 1 % Apply to affected area 2 times daily 09/22/23   Bauer, Collin S, PA-C  hydrOXYzine  (ATARAX ) 25 MG tablet Take 1 tablet (25 mg total) by mouth every 8 (eight) hours as needed. 11/23/22   Henderly, Britni A, PA-C  lidocaine  (LIDODERM ) 5 % Place 1 patch onto the skin daily. Remove & Discard patch within 12 hours or as directed by MD 11/30/23   Patt Alm Macho, MD  loratadine  (CLARITIN ) 10 MG tablet Take 1 tablet (10 mg total) by mouth daily. 04/06/23  Barrett, Warren SAILOR, PA-C  metFORMIN  (GLUCOPHAGE ) 500 MG tablet Take 1 tablet (500 mg total) by mouth 2 (two) times daily with a meal. 12/13/23 01/12/24  Sponseller, Rebekah R, PA-C  methocarbamol  (ROBAXIN ) 500 MG tablet Take 1 tablet (500 mg total) by mouth every 8 (eight) hours as needed for muscle spasms. 11/30/23   Patt Alm Macho, MD  metoprolol  succinate (TOPROL -XL) 25 MG 24 hr tablet Take 1 tablet (25 mg total) by mouth daily. 12/18/23 01/17/24  Minnie Tinnie BRAVO, PA  metoprolol  succinate (TOPROL -XL) 25 MG 24 hr tablet Take 1 tablet (25 mg total) by mouth  daily. 12/21/23   Kammerer, Megan L, DO  Multiple Vitamins-Minerals (MULTIVITAMIN WOMEN 50+) TABS Take 1 tablet by mouth daily. 10/15/23   Beverley Leita LABOR, PA-C  naproxen  (NAPROSYN ) 375 MG tablet Take 1 tablet (375 mg total) by mouth 2 (two) times daily as needed for moderate pain (pain score 4-6). 09/07/23   Sponseller, Pleasant R, PA-C  pantoprazole  (PROTONIX ) 20 MG tablet Take 1 tablet (20 mg total) by mouth daily. 10/10/21   Dean Clarity, MD  rosuvastatin  (CRESTOR ) 10 MG tablet Take 1 tablet (10 mg total) by mouth at bedtime. 12/13/23   Sponseller, Rebekah R, PA-C  triamcinolone  cream (KENALOG ) 0.1 % Apply 1 Application topically 2 (two) times daily. 07/07/23   Vicky Charleston, PA-C                                                                                                                                    Past Surgical History Past Surgical History:  Procedure Laterality Date   ABDOMINAL HYSTERECTOMY     ANTERIOR CERVICAL DECOMPRESSION/DISCECTOMY FUSION 4 LEVELS N/A 11/11/2019   Procedure: Cervical three-four Cervical four-five Cervical five-six Cervical six-seven  Anterior cervical decompression/discectomy/fusion;  Surgeon: Unice Pac, MD;  Location: [facility] OR;  Service: Neurosurgery;  Laterality: N/A;   LEFT HEART CATHETERIZATION WITH CORONARY ANGIOGRAM N/A 11/05/2013   Procedure: LEFT HEART CATHETERIZATION WITH CORONARY ANGIOGRAM;  Surgeon: Debby LABOR Sor, MD;  Location: [facility] CATH LAB;  Service: Cardiovascular;  Laterality: N/A;   Family History History reviewed. No pertinent family history.  Social History Social History   Tobacco Use   Smoking status: Never   Smokeless tobacco: Never  Vaping Use   Vaping status: Never Used  Substance Use Topics   Alcohol  use: No   Drug use: No   Allergies Codeine, Ibuprofen , and Imdur [isosorbide nitrate]  Review of Systems Review of Systems  All other systems reviewed and are negative.   Physical Exam Vital Signs  I have reviewed  the triage vital signs BP (!) 203/66 (BP Location: Right Arm)   Pulse 62   Temp 97.6 F (36.4 C)   Resp 17   Ht 5' 4 (1.626 m)   Wt 54.4 kg   SpO2 100%   BMI 20.59 kg/m  Physical Exam Vitals and nursing note reviewed.  Constitutional:  Appearance: Normal appearance.  HENT:     Head: Normocephalic and atraumatic.     Mouth/Throat:     Mouth: Mucous membranes are moist.  Eyes:     Conjunctiva/sclera: Conjunctivae normal.  Cardiovascular:     Rate and Rhythm: Normal rate.  Pulmonary:     Effort: Pulmonary effort is normal. No respiratory distress.  Abdominal:     General: Abdomen is flat.  Musculoskeletal:        General: No deformity.  Skin:    General: Skin is warm and dry.     Capillary Refill: Capillary refill takes less than 2 seconds.  Neurological:     General: No focal deficit present.     Mental Status: She is alert. Mental status is at baseline.  Psychiatric:        Mood and Affect: Mood normal.        Behavior: Behavior normal.     ED Results and Treatments Labs (all labs ordered are listed, but only abnormal results are displayed) Labs Reviewed - No data to display                                                                                                                        Radiology No results found.  Pertinent labs & imaging results that were available during my care of the patient were reviewed by me and considered in my medical decision making (see MDM for details).  Medications Ordered in ED Medications  acetaminophen  (TYLENOL ) tablet 1,000 mg (has no administration in time range)                                                                                                                                     Procedures Procedures  (including critical care time)  Medical Decision Making / ED Course   MDM:  78 year old presenting to the emergency department with bodyaches.  Patient presents to the emergency department  frequently for similar complaints.  She denies any new complaints today.  Her symptoms today seem consistent with prior episodes of chronic aches and pains.  She has been seen in the emergency department once already today.  I do not think she needs any further evaluation as far as further blood work or diagnostic imaging. vitals are notable for hypertension, she has history of chronic hypertension and is chronically noncompliant with medical therapy for this.  I do not  think there is any further workup to do at this point. Patient stable for discharge. Will discharge patient to home. All questions answered. Patient comfortable with plan of discharge. Return precautions discussed with patient and specified on the after visit summary.       Additional history obtained:  -External records from outside source obtained and reviewed including: Chart review including previous notes, labs, imaging, consultation notes including prior er visits    Medicines ordered and prescription drug management: Meds ordered this encounter  Medications   acetaminophen  (TYLENOL ) tablet 1,000 mg    -I have reviewed the patients home medicines and have made adjustments as needed  Co morbidities that complicate the patient evaluation  Past Medical History:  Diagnosis Date   Diabetes mellitus without complication (HCC)    GERD 09/04/2006   Qualifier: Diagnosis of  By: Lazaro Aquas     History of echocardiogram    a. 2D ECHO: 11/06/2013 EF 65%; no WMA. Mild TR. PA pk pressure 43 mm HG   Hypertension    Stroke [facility])       Dispostion: Disposition decision including need for hospitalization was considered, and patient discharged from emergency department.    Final Clinical Impression(s) / ED Diagnoses Final diagnoses:  Body aches     This chart was dictated using voice recognition software.  Despite best efforts to proofread,  errors can occur which can change the documentation meaning.    [name], MD 12/21/23 [phone]

## 2023-12-21 NOTE — Discharge Instructions (Addendum)
 You can use Tylenol  and Motrin  as needed for pain.  You can use your meloxicam muscle relaxers as needed for pain.  Please take your antihypertensive medication as prescribed.  Follow-up with your primary care doctor in 1 to 2 weeks.

## 2023-12-23 ENCOUNTER — Other Ambulatory Visit: Payer: Self-pay

## 2023-12-23 ENCOUNTER — Emergency Department (HOSPITAL_COMMUNITY)
Admission: EM | Admit: 2023-12-23 | Discharge: 2023-12-24 | Disposition: A | Discharge status: Home / Self Care | Attending: Emergency Medicine | Admitting: Emergency Medicine

## 2023-12-23 ENCOUNTER — Encounter (HOSPITAL_COMMUNITY): Payer: Self-pay | Admitting: Radiology

## 2023-12-23 DIAGNOSIS — R52 Pain, unspecified: Secondary | ICD-10-CM

## 2023-12-23 DIAGNOSIS — M791 Myalgia, unspecified site: Secondary | ICD-10-CM | POA: Insufficient documentation

## 2023-12-23 MED ORDER — ACETAMINOPHEN 325 MG PO TABS
650.0000 mg | ORAL_TABLET | Freq: Once | ORAL | Status: AC
  Administered 2023-12-23: 650 mg via ORAL
  Filled 2023-12-23: qty 2
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
## 2023-12-23 NOTE — Discharge Instructions (Signed)
 Take your blood pressure medication as directed. Follow-up with your doctor. Return here for new concerns.

## 2024-01-04 ENCOUNTER — Emergency Department (HOSPITAL_COMMUNITY): Admission: EM | Admit: 2024-01-04 | Discharge: 2024-01-04 | Disposition: A | Discharge status: Home / Self Care

## 2024-01-04 ENCOUNTER — Other Ambulatory Visit: Payer: Self-pay

## 2024-01-04 ENCOUNTER — Encounter (HOSPITAL_COMMUNITY): Payer: Self-pay

## 2024-01-04 DIAGNOSIS — M549 Dorsalgia, unspecified: Secondary | ICD-10-CM | POA: Insufficient documentation

## 2024-01-04 DIAGNOSIS — R52 Pain, unspecified: Secondary | ICD-10-CM

## 2024-01-04 DIAGNOSIS — Z79899 Other long term (current) drug therapy: Secondary | ICD-10-CM | POA: Insufficient documentation

## 2024-01-04 DIAGNOSIS — M791 Myalgia, unspecified site: Secondary | ICD-10-CM | POA: Insufficient documentation

## 2024-01-04 DIAGNOSIS — I10 Essential (primary) hypertension: Secondary | ICD-10-CM | POA: Insufficient documentation

## 2024-01-04 MED ORDER — ACETAMINOPHEN 325 MG PO TABS
650.0000 mg | ORAL_TABLET | Freq: Once | ORAL | Status: AC
  Administered 2024-01-04: 650 mg via ORAL
  Filled 2024-01-04: qty 2

## 2024-01-04 NOTE — Discharge Instructions (Addendum)
 Your blood pressure was elevated today.  Take your prescribed blood pressure medications as directed and follow-up with primary care for ongoing management of hypertension.
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
## 2024-01-04 NOTE — ED Triage Notes (Signed)
 Pt to triage requesting a nurse wash her back with a hot washcloth. Pt irritable that this RN will not wash her back, pt mumbling to herself in triage room

## 2024-01-07 ENCOUNTER — Emergency Department (HOSPITAL_COMMUNITY): Admission: EM | Admit: 2024-01-07 | Discharge: 2024-01-07 | Disposition: A | Discharge status: Home / Self Care

## 2024-01-07 ENCOUNTER — Encounter (HOSPITAL_COMMUNITY): Payer: Self-pay

## 2024-01-07 ENCOUNTER — Other Ambulatory Visit: Payer: Self-pay

## 2024-01-07 DIAGNOSIS — G8929 Other chronic pain: Secondary | ICD-10-CM

## 2024-01-07 MED ORDER — ACETAMINOPHEN 325 MG PO TABS
650.0000 mg | ORAL_TABLET | Freq: Once | ORAL | Status: AC
  Administered 2024-01-07: 16:00:00 650 mg via ORAL
  Filled 2024-01-07: qty 2

## 2024-01-07 MED ORDER — LIDOCAINE 5 % EX PTCH
2.0000 | MEDICATED_PATCH | CUTANEOUS | Status: DC
  Administered 2024-01-07: 16:00:00 2 via TRANSDERMAL
  Filled 2024-01-07: qty 2

## 2024-01-07 NOTE — ED Notes (Signed)
 Patient verbalizes understanding of discharge instructions. Opportunity for questioning and answers were provided. Pt discharged from ED.

## 2024-01-07 NOTE — Discharge Instructions (Signed)
 Continue Tylenol  and lidocaine  patches for pain.  I provided you with contact information for [place] [facility], please establish follow-up with their office since you do not have a PCP locally, as they can help you manage your chronic pain more appropriately.
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
## 2024-01-09 ENCOUNTER — Emergency Department (HOSPITAL_COMMUNITY)
Admission: EM | Admit: 2024-01-09 | Discharge: 2024-01-10 | Disposition: A | Attending: Emergency Medicine | Admitting: Emergency Medicine

## 2024-01-09 DIAGNOSIS — I10 Essential (primary) hypertension: Secondary | ICD-10-CM | POA: Insufficient documentation

## 2024-01-09 DIAGNOSIS — M791 Myalgia, unspecified site: Secondary | ICD-10-CM | POA: Diagnosis present

## 2024-01-09 DIAGNOSIS — G8929 Other chronic pain: Secondary | ICD-10-CM | POA: Diagnosis not present

## 2024-01-09 DIAGNOSIS — E119 Type 2 diabetes mellitus without complications: Secondary | ICD-10-CM | POA: Insufficient documentation
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
## 2024-01-10 ENCOUNTER — Encounter (HOSPITAL_COMMUNITY): Payer: Self-pay

## 2024-01-10 ENCOUNTER — Other Ambulatory Visit: Payer: Self-pay

## 2024-01-10 DIAGNOSIS — I10 Essential (primary) hypertension: Secondary | ICD-10-CM | POA: Diagnosis not present

## 2024-01-10 MED ORDER — AMLODIPINE BESYLATE 10 MG PO TABS: 10.0000 mg | ORAL_TABLET | Freq: Every day | ORAL | 0 refills | Status: DC

## 2024-01-10 MED ORDER — METOPROLOL SUCCINATE ER 25 MG PO TB24: 25.0000 mg | ORAL_TABLET | Freq: Every day | ORAL | 0 refills | Status: DC

## 2024-01-10 MED ORDER — METHOCARBAMOL 500 MG PO TABS
500.0000 mg | ORAL_TABLET | Freq: Once | ORAL | Status: AC
  Administered 2024-01-10: 500 mg via ORAL
  Filled 2024-01-10: qty 1

## 2024-01-10 MED ORDER — PANTOPRAZOLE SODIUM 20 MG PO TBEC: 20.0000 mg | DELAYED_RELEASE_TABLET | Freq: Every day | ORAL | 0 refills | Status: DC

## 2024-01-10 MED ORDER — METHOCARBAMOL 500 MG PO TABS: 500.0000 mg | ORAL_TABLET | Freq: Three times a day (TID) | ORAL | 0 refills | Status: AC | PRN

## 2024-01-10 MED ORDER — AMLODIPINE BESYLATE 5 MG PO TABS
10.0000 mg | ORAL_TABLET | Freq: Once | ORAL | Status: AC
  Administered 2024-01-10: 10 mg via ORAL
  Filled 2024-01-10: qty 2

## 2024-01-10 NOTE — Discharge Instructions (Signed)
 You were evaluated in the Emergency Department and after careful evaluation, we did not find any emergent condition requiring admission or further testing in the hospital.  Your exam/testing today is overall reassuring.  We are refilling your home medications.  Recommend follow-up with primary care.  Please return to the Emergency Department if you experience any worsening of your condition.   Thank you for allowing us  to be a part of your care.

## 2024-01-10 NOTE — ED Notes (Signed)
 Patient stated that she has not taken her antihypertensive medications .
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
## 2024-01-14 ENCOUNTER — Emergency Department (HOSPITAL_COMMUNITY): Admission: EM | Admit: 2024-01-14 | Discharge: 2024-01-14 | Disposition: A | Discharge status: Home / Self Care

## 2024-01-14 DIAGNOSIS — H269 Unspecified cataract: Secondary | ICD-10-CM

## 2024-01-14 DIAGNOSIS — I10 Essential (primary) hypertension: Secondary | ICD-10-CM | POA: Diagnosis not present

## 2024-01-14 DIAGNOSIS — G8929 Other chronic pain: Secondary | ICD-10-CM

## 2024-01-14 DIAGNOSIS — H25813 Combined forms of age-related cataract, bilateral: Secondary | ICD-10-CM | POA: Insufficient documentation

## 2024-01-14 DIAGNOSIS — R519 Headache, unspecified: Secondary | ICD-10-CM | POA: Diagnosis present

## 2024-01-14 DIAGNOSIS — Z79899 Other long term (current) drug therapy: Secondary | ICD-10-CM | POA: Insufficient documentation

## 2024-01-14 MED ORDER — DICLOFENAC SODIUM 1 % EX GEL: 2.0000 g | Freq: Three times a day (TID) | CUTANEOUS | 0 refills | Status: DC | PRN

## 2024-01-14 MED ORDER — CHLORTHALIDONE 25 MG PO TABS: 25.0000 mg | ORAL_TABLET | Freq: Every day | ORAL | 0 refills | Status: AC

## 2024-01-14 MED ORDER — PANTOPRAZOLE SODIUM 20 MG PO TBEC: 20.0000 mg | DELAYED_RELEASE_TABLET | Freq: Every day | ORAL | 0 refills | Status: AC

## 2024-01-14 MED ORDER — ROSUVASTATIN CALCIUM 10 MG PO TABS: 10.0000 mg | ORAL_TABLET | Freq: Every day | ORAL | 0 refills | Status: AC

## 2024-01-14 MED ORDER — HYDROXYZINE HCL 25 MG PO TABS: 25.0000 mg | ORAL_TABLET | Freq: Three times a day (TID) | ORAL | 0 refills | Status: AC | PRN

## 2024-01-14 MED ORDER — METOPROLOL SUCCINATE ER 25 MG PO TB24: 25.0000 mg | ORAL_TABLET | Freq: Every day | ORAL | 0 refills | Status: DC

## 2024-01-14 MED ORDER — AMLODIPINE BESYLATE 10 MG PO TABS: 10.0000 mg | ORAL_TABLET | Freq: Every day | ORAL | 0 refills | Status: DC

## 2024-01-14 MED ORDER — CLONIDINE HCL 0.2 MG PO TABS: 0.2000 mg | ORAL_TABLET | Freq: Two times a day (BID) | ORAL | 0 refills | Status: DC

## 2024-01-14 NOTE — Discharge Instructions (Signed)
 You been provided with a referral for ophthalmology, they will contact you to set up an appointment within the next week to 2 weeks.  Please continue to take your medications as prescribed.  It is very important that you do follow-up with ophthalmology as unfortunately there is not any eyedrop that will help with your visual problems due to changes of your eye.

## 2024-01-14 NOTE — ED Provider Notes (Signed)
 " [place] EMERGENCY DEPARTMENT AT [facility] Provider Note   CSN: [id number] Arrival date & time: 01/14/24  1218     Patient presents with: Headache   [name] is a 78 y.o. female who presents to the ED today with concerns of headache but also has complaints of chronic body pain as well as decreased visual acuity.  These complaints not changed since previous presentations over the last year however she does state that she is concerned about her vision as it is hard for her to see and this has been worsening.  She has been previously referred multiple times by other providers and this provider for ophthalmology follow-up.  However due to transportation concerns she has been unable to follow-up with ophthalmology thus far.  She states that she is low on her medications but has been taking her blood pressure medicines and other medications regularly as prescribed.  Does request refills of the same.    Headache      Prior to Admission medications  Medication Sig Start Date End Date Taking? Authorizing Provider  acetaminophen  (TYLENOL ) 500 MG tablet Take 1 tablet (500 mg total) by mouth every 6 (six) hours as needed. 08/02/23   Elnor Jayson LABOR, DO  albuterol  (VENTOLIN  HFA) 108 (90 Base) MCG/ACT inhaler Inhale 1-2 puffs into the lungs every 6 (six) hours as needed for wheezing or shortness of breath. 04/22/20   Myrna Camelia HERO, NP  amLODipine  (NORVASC ) 10 MG tablet Take 1 tablet (10 mg total) by mouth daily. 01/14/24 02/13/24  [name] BROCKS, PA  artificial tears ophthalmic solution Place 1 drop into both eyes as needed for dry eyes. 09/20/23   Jarold Olam HERO, PA-C  chlorthalidone  (HYGROTON ) 25 MG tablet Take 1 tablet (25 mg total) by mouth daily. 01/14/24 01/13/25  [name] BROCKS, PA  cloNIDine  (CATAPRES ) 0.2 MG tablet Take 1 tablet (0.2 mg total) by mouth 2 (two) times daily. 01/14/24   [name] BROCKS, PA  diclofenac  Sodium (VOLTAREN  ARTHRITIS PAIN) 1 % GEL Apply 2  g topically 3 (three) times daily as needed. 01/14/24   [name] BROCKS, PA  diphenhydrAMINE  (BENADRYL ) 25 MG tablet Take 1 tablet (25 mg total) by mouth every 6 (six) hours as needed for itching or allergies. 10/03/22   [name], PA-C  gabapentin  (NEURONTIN ) 100 MG capsule Take 1 capsule (100 mg total) by mouth 3 (three) times daily as needed. 04/30/23   Bero, Michael M, MD  hydrocortisone  cream 1 % Apply to affected area 2 times daily 09/22/23   Bauer, Collin S, PA-C  hydrOXYzine  (ATARAX ) 25 MG tablet Take 1 tablet (25 mg total) by mouth every 8 (eight) hours as needed. 01/14/24   [name] BROCKS, PA  lidocaine  (LIDODERM ) 5 % Place 1 patch onto the skin daily. Remove & Discard patch within 12 hours or as directed by MD 11/30/23   Patt Alm Macho, MD  loratadine  (CLARITIN ) 10 MG tablet Take 1 tablet (10 mg total) by mouth daily. 04/06/23   Barrett, Jamie N, PA-C  metFORMIN  (GLUCOPHAGE ) 500 MG tablet Take 1 tablet (500 mg total) by mouth 2 (two) times daily with a meal. 12/13/23 01/12/24  Sponseller, Rebekah R, PA-C  methocarbamol  (ROBAXIN ) 500 MG tablet Take 1 tablet (500 mg total) by mouth every 8 (eight) hours as needed for muscle spasms. 01/10/24   Theadore Ozell HERO, MD  metoprolol  succinate (TOPROL -XL) 25 MG 24 hr tablet Take 1 tablet (25 mg total) by mouth daily. 01/14/24  [name] BROCKS, PA  Multiple Vitamins-Minerals (MULTIVITAMIN WOMEN 50+) TABS Take 1 tablet by mouth daily. 10/15/23   Beverley Leita LABOR, PA-C  naproxen  (NAPROSYN ) 375 MG tablet Take 1 tablet (375 mg total) by mouth 2 (two) times daily as needed for moderate pain (pain score 4-6). 09/07/23   Sponseller, Rebekah R, PA-C  pantoprazole  (PROTONIX ) 20 MG tablet Take 1 tablet (20 mg total) by mouth daily. 01/14/24   [name] BROCKS, PA  rosuvastatin  (CRESTOR ) 10 MG tablet Take 1 tablet (10 mg total) by mouth at bedtime. 01/14/24   [name] BROCKS, PA  triamcinolone  cream (KENALOG ) 0.1 % Apply 1 Application topically 2  (two) times daily. 07/07/23   Vicky Charleston, PA-C    Allergies: Codeine, Ibuprofen , and Imdur [isosorbide nitrate]    Review of Systems  Musculoskeletal:  Positive for arthralgias.  Neurological:  Positive for headaches.  All other systems reviewed and are negative.   Updated Vital Signs BP (!) 200/84 (BP Location: Left Wrist)   Pulse 73   Temp 98.3 F (36.8 C) (Oral)   Resp 17   Wt 61.7 kg   SpO2 100%   BMI 23.34 kg/m   Physical Exam Vitals and nursing note reviewed.  Constitutional:      General: She is not in acute distress.    Appearance: Normal appearance.  HENT:     Head: Normocephalic and atraumatic.     Mouth/Throat:     Mouth: Mucous membranes are moist.     Pharynx: Oropharynx is clear.  Eyes:     General: Lids are normal. Vision grossly intact. Gaze aligned appropriately.     Extraocular Movements: Extraocular movements intact.     Conjunctiva/sclera: Conjunctivae normal.     Pupils: Pupils are equal, round, and reactive to light.     Comments: Pupils are equal and reactive bilaterally however there is noted clouding and opacification of the lenses bilaterally.  Cardiovascular:     Rate and Rhythm: Normal rate and regular rhythm.     Pulses: Normal pulses.     Heart sounds: Normal heart sounds. No murmur heard.    No friction rub. No gallop.  Pulmonary:     Effort: Pulmonary effort is normal.     Breath sounds: Normal breath sounds.  Abdominal:     General: Abdomen is flat. Bowel sounds are normal.     Palpations: Abdomen is soft.  Musculoskeletal:        General: Normal range of motion.     Cervical back: Normal range of motion and neck supple.     Right lower leg: No edema.     Left lower leg: No edema.  Skin:    General: Skin is warm and dry.     Capillary Refill: Capillary refill takes less than 2 seconds.  Neurological:     General: No focal deficit present.     Mental Status: She is alert. Mental status is at baseline.  Psychiatric:         Mood and Affect: Mood normal.     (all labs ordered are listed, but only abnormal results are displayed) Labs Reviewed - No data to display  EKG: None  Radiology: No results found.   Procedures   Medications Ordered in the ED - No data to display                                  Medical  Decision Making Risk Prescription drug management.   Patient does not have any concerning signs or symptoms on the physical exam today and presents for chronic complaints as previously noted and multiple ED visits over the last year.  Physical exam did not show any concerning findings however there is some opacification of the bilateral lenses of the eyes suggesting etiology of her decreased vision as related to cataracts.  Again will refer to plate and for ambulatory referral to ophthalmology for further assessment and have stressed to the patient that she needs to follow-up for ophthalmology as there is unfortunately not a specific eyedrop that will immediately cure her symptoms that she has requested.  Will refill her medications as requested to ensure management of her chronic conditions though have impressed upon her the importance of follow-up with primary care which she states she does not have primary care and that the emergency department is her primary care.  Given the reassuring physical exam we will discharge with outpatient follow-up as previously discussed.     Final diagnoses:  Chronic nonintractable headache, unspecified headache type  Cataract of both eyes, unspecified cataract type  Primary hypertension    ED Discharge Orders          Ordered    Ambulatory referral to Ophthalmology        01/14/24 1321    amLODipine  (NORVASC ) 10 MG tablet  Daily        01/14/24 1322    chlorthalidone  (HYGROTON ) 25 MG tablet  Daily       Note to Pharmacy: REFILL pt will need an appointment before this will be filled again.   01/14/24 1322    cloNIDine  (CATAPRES ) 0.2 MG tablet  2 times  daily        01/14/24 1322    diclofenac  Sodium (VOLTAREN  ARTHRITIS PAIN) 1 % GEL  3 times daily PRN        01/14/24 1322    hydrOXYzine  (ATARAX ) 25 MG tablet  Every 8 hours PRN        01/14/24 1322    metoprolol  succinate (TOPROL -XL) 25 MG 24 hr tablet  Daily        01/14/24 1322    pantoprazole  (PROTONIX ) 20 MG tablet  Daily        01/14/24 1322    rosuvastatin  (CRESTOR ) 10 MG tablet  Daily at bedtime        01/14/24 1322               [name] BROCKS, [place] 01/14/24 1327    Gennaro Bouchard L, DO 01/20/24 1539  "

## 2024-01-14 NOTE — ED Triage Notes (Signed)
 Pt states she believes she is having a HA r/t her BP. Pt unwilling to elaborate further. No weakness appreciated in triage. Pt with frequent hx of bedbugs.

## 2024-01-20 ENCOUNTER — Encounter (HOSPITAL_COMMUNITY): Payer: Self-pay

## 2024-01-20 ENCOUNTER — Emergency Department (HOSPITAL_COMMUNITY)
Admission: EM | Admit: 2024-01-20 | Discharge: 2024-01-20 | Disposition: A | Discharge status: Home / Self Care | Attending: Emergency Medicine | Admitting: Emergency Medicine

## 2024-01-20 ENCOUNTER — Other Ambulatory Visit: Payer: Self-pay

## 2024-01-20 DIAGNOSIS — E119 Type 2 diabetes mellitus without complications: Secondary | ICD-10-CM | POA: Insufficient documentation

## 2024-01-20 DIAGNOSIS — G8929 Other chronic pain: Secondary | ICD-10-CM | POA: Insufficient documentation

## 2024-01-20 DIAGNOSIS — Z760 Encounter for issue of repeat prescription: Secondary | ICD-10-CM | POA: Diagnosis present

## 2024-01-20 DIAGNOSIS — Z7984 Long term (current) use of oral hypoglycemic drugs: Secondary | ICD-10-CM | POA: Insufficient documentation

## 2024-01-20 MED ORDER — ACETAMINOPHEN 325 MG PO TABS
650.0000 mg | ORAL_TABLET | Freq: Once | ORAL | Status: AC
  Administered 2024-01-20: 650 mg via ORAL
  Filled 2024-01-20: qty 2

## 2024-01-20 MED ORDER — LIDOCAINE 5 % EX PTCH: 1.0000 | MEDICATED_PATCH | CUTANEOUS | 0 refills | Status: AC
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
## 2024-01-20 NOTE — ED Triage Notes (Signed)
 Patient is here for medication refill and body pain. She states she does not feel good.

## 2024-01-20 NOTE — Discharge Instructions (Addendum)
 I have prescribed lidocaine  patches to help with your pain.  Please 1 patch onto your skin daily.  Remove the patch within 12 hours.  You may also take Tylenol  at home. Return to the emergency department if you develop any life threatening symptoms.

## 2024-01-26 ENCOUNTER — Encounter (HOSPITAL_COMMUNITY): Payer: Self-pay | Admitting: *Deleted

## 2024-01-26 ENCOUNTER — Other Ambulatory Visit: Payer: Self-pay

## 2024-01-26 ENCOUNTER — Emergency Department (HOSPITAL_COMMUNITY): Admission: EM | Admit: 2024-01-26 | Discharge: 2024-01-26 | Disposition: A | Discharge status: Home / Self Care

## 2024-01-26 DIAGNOSIS — M25512 Pain in left shoulder: Secondary | ICD-10-CM | POA: Insufficient documentation

## 2024-01-26 DIAGNOSIS — M25511 Pain in right shoulder: Secondary | ICD-10-CM | POA: Diagnosis present

## 2024-01-26 DIAGNOSIS — I10 Essential (primary) hypertension: Secondary | ICD-10-CM | POA: Insufficient documentation

## 2024-01-26 DIAGNOSIS — Z79899 Other long term (current) drug therapy: Secondary | ICD-10-CM | POA: Insufficient documentation

## 2024-01-26 DIAGNOSIS — G8929 Other chronic pain: Secondary | ICD-10-CM | POA: Insufficient documentation

## 2024-01-26 DIAGNOSIS — M549 Dorsalgia, unspecified: Secondary | ICD-10-CM | POA: Diagnosis not present

## 2024-01-26 MED ORDER — DICLOFENAC SODIUM 1 % EX GEL: 4.0000 g | Freq: Four times a day (QID) | CUTANEOUS | 0 refills | Status: AC

## 2024-01-26 NOTE — ED Provider Notes (Signed)
 " [facility] Provider Note   CSN: [id number] Arrival date & time: 01/26/24  1919     Patient presents with: wants medication   [name] is a 79 y.o. female.   79 year old female with past medical history of hypertension and chronic pain with multiple ER visits in the past presents the emergency department today complaining of pain mostly in her shoulders and back.  The patient states that this been going on for 2 years after she fell on the bus.  She states that Tylenol  does not help her.  She came to the ER today for further evaluation regarding this.        Prior to Admission medications  Medication Sig Start Date End Date Taking? Authorizing Provider  diclofenac  Sodium (VOLTAREN ) 1 % GEL Apply 4 g topically 4 (four) times daily. 01/26/24  Yes Ula Prentice SAUNDERS, MD  acetaminophen  (TYLENOL ) 500 MG tablet Take 1 tablet (500 mg total) by mouth every 6 (six) hours as needed. 08/02/23   Elnor Jayson LABOR, DO  albuterol  (VENTOLIN  HFA) 108 (90 Base) MCG/ACT inhaler Inhale 1-2 puffs into the lungs every 6 (six) hours as needed for wheezing or shortness of breath. 04/22/20   Myrna Camelia HERO, NP  amLODipine  (NORVASC ) 10 MG tablet Take 1 tablet (10 mg total) by mouth daily. 01/14/24 02/13/24  [name] BROCKS, PA  artificial tears ophthalmic solution Place 1 drop into both eyes as needed for dry eyes. 09/20/23   Jarold Olam HERO, PA-C  chlorthalidone  (HYGROTON ) 25 MG tablet Take 1 tablet (25 mg total) by mouth daily. 01/14/24 01/13/25  [name] BROCKS, PA  cloNIDine  (CATAPRES ) 0.2 MG tablet Take 1 tablet (0.2 mg total) by mouth 2 (two) times daily. 01/14/24   [name] BROCKS, PA  diclofenac  Sodium (VOLTAREN  ARTHRITIS PAIN) 1 % GEL Apply 2 g topically 3 (three) times daily as needed. 01/14/24   [name] BROCKS, PA  diphenhydrAMINE  (BENADRYL ) 25 MG tablet Take 1 tablet (25 mg total) by mouth every 6 (six) hours as needed for itching or allergies.  10/03/22   [name], PA-C  gabapentin  (NEURONTIN ) 100 MG capsule Take 1 capsule (100 mg total) by mouth 3 (three) times daily as needed. 04/30/23   Bero, Michael M, MD  hydrocortisone  cream 1 % Apply to affected area 2 times daily 09/22/23   Bauer, Collin S, PA-C  hydrOXYzine  (ATARAX ) 25 MG tablet Take 1 tablet (25 mg total) by mouth every 8 (eight) hours as needed. 01/14/24   [name] BROCKS, PA  lidocaine  (LIDODERM ) 5 % Place 1 patch onto the skin daily. Remove & Discard patch within 12 hours or as directed by MD 01/20/24   Logan Ubaldo NOVAK, PA-C  loratadine  (CLARITIN ) 10 MG tablet Take 1 tablet (10 mg total) by mouth daily. 04/06/23   Barrett, Warren SAILOR, PA-C  metFORMIN  (GLUCOPHAGE ) 500 MG tablet Take 1 tablet (500 mg total) by mouth 2 (two) times daily with a meal. 12/13/23 01/12/24  Sponseller, Rebekah R, PA-C  methocarbamol  (ROBAXIN ) 500 MG tablet Take 1 tablet (500 mg total) by mouth every 8 (eight) hours as needed for muscle spasms. 01/10/24   Theadore Ozell HERO, MD  metoprolol  succinate (TOPROL -XL) 25 MG 24 hr tablet Take 1 tablet (25 mg total) by mouth daily. 01/14/24   [name] BROCKS, PA  Multiple Vitamins-Minerals (MULTIVITAMIN WOMEN 50+) TABS Take 1 tablet by mouth daily. 10/15/23   Beverley Leita LABOR, PA-C  naproxen  (NAPROSYN ) 375 MG  tablet Take 1 tablet (375 mg total) by mouth 2 (two) times daily as needed for moderate pain (pain score 4-6). 09/07/23   Sponseller, Pleasant SAUNDERS, PA-C  pantoprazole  (PROTONIX ) 20 MG tablet Take 1 tablet (20 mg total) by mouth daily. 01/14/24   [name] BROCKS, PA  rosuvastatin  (CRESTOR ) 10 MG tablet Take 1 tablet (10 mg total) by mouth at bedtime. 01/14/24   [name] BROCKS, PA  triamcinolone  cream (KENALOG ) 0.1 % Apply 1 Application topically 2 (two) times daily. 07/07/23   Vicky Charleston, PA-C    Allergies: Codeine, Ibuprofen , and Imdur [isosorbide nitrate]    Review of Systems  Musculoskeletal:  Positive for arthralgias.  All other systems  reviewed and are negative.   Updated Vital Signs BP 123/64 (BP Location: Right Arm)   Pulse 73   Temp 98.2 F (36.8 C) (Oral)   Resp 16   Ht 5' 4 (1.626 m)   Wt 61.7 kg   SpO2 100%   BMI 23.35 kg/m   Physical Exam Vitals and nursing note reviewed.   Gen: NAD Eyes: PERRL, EOMI HEENT: no oropharyngeal swelling Neck: trachea midline Resp: clear to auscultation bilaterally Card: RRR, no murmurs, rubs, or gallops Abd: nontender, nondistended Extremities: no calf tenderness, no edema Vascular: 2+ radial pulses bilaterally, 2+ DP pulses bilaterally Skin: no rashes Psyc: acting appropriately   (all labs ordered are listed, but only abnormal results are displayed) Labs Reviewed - No data to display  EKG: None  Radiology: No results found.   Procedures   Medications Ordered in the ED - No data to display                                  Medical Decision Making 79 year old female with past medical history of chronic pain, hypertension, and hyperlipidemia who is very well-known to the emergency department complaining of chronic pain.  The patient is well-appearing with stable vital signs.  I will give the patient a prescription for Voltaren  gel which she has not been taking.  She is encouraged to try this and to follow-up with her doctor.  She is discharged with return precautions.        Final diagnoses:  Other chronic pain    ED Discharge Orders          Ordered    diclofenac  Sodium (VOLTAREN ) 1 % GEL  4 times daily        01/26/24 2106               Ula Prentice SAUNDERS, MD 01/26/24 2108  "

## 2024-01-26 NOTE — Discharge Instructions (Signed)
 Please try the Voltaren  gel for your pain.  Follow-up with your doctor.  If you do not have a doctor to follow-up with please call the physician referral line.  Return to the emergency department for worsening symptoms.

## 2024-01-26 NOTE — ED Notes (Signed)
 Patient is speaking with her daughter Donia over the phone.

## 2024-01-26 NOTE — ED Triage Notes (Signed)
 The pt is here for medication

## 2024-02-04 ENCOUNTER — Other Ambulatory Visit: Payer: Self-pay

## 2024-02-04 ENCOUNTER — Observation Stay (HOSPITAL_COMMUNITY)
Admission: EM | Admit: 2024-02-04 | Discharge: 2024-02-06 | Disposition: A | Discharge status: Home Health | Attending: Internal Medicine | Admitting: Internal Medicine

## 2024-02-04 DIAGNOSIS — I10 Essential (primary) hypertension: Secondary | ICD-10-CM | POA: Diagnosis not present

## 2024-02-04 DIAGNOSIS — R001 Bradycardia, unspecified: Principal | ICD-10-CM | POA: Insufficient documentation

## 2024-02-04 DIAGNOSIS — E119 Type 2 diabetes mellitus without complications: Secondary | ICD-10-CM | POA: Diagnosis not present

## 2024-02-04 DIAGNOSIS — R2681 Unsteadiness on feet: Secondary | ICD-10-CM | POA: Insufficient documentation

## 2024-02-04 DIAGNOSIS — Z8673 Personal history of transient ischemic attack (TIA), and cerebral infarction without residual deficits: Secondary | ICD-10-CM

## 2024-02-04 DIAGNOSIS — R2689 Other abnormalities of gait and mobility: Secondary | ICD-10-CM | POA: Insufficient documentation

## 2024-02-04 DIAGNOSIS — B882 Other arthropod infestations: Secondary | ICD-10-CM | POA: Diagnosis not present

## 2024-02-04 DIAGNOSIS — N179 Acute kidney failure, unspecified: Secondary | ICD-10-CM | POA: Diagnosis not present

## 2024-02-04 DIAGNOSIS — K219 Gastro-esophageal reflux disease without esophagitis: Secondary | ICD-10-CM | POA: Diagnosis not present

## 2024-02-04 DIAGNOSIS — Z79899 Other long term (current) drug therapy: Secondary | ICD-10-CM | POA: Insufficient documentation

## 2024-02-04 DIAGNOSIS — G8929 Other chronic pain: Secondary | ICD-10-CM

## 2024-02-04 DIAGNOSIS — R519 Headache, unspecified: Secondary | ICD-10-CM | POA: Diagnosis present

## 2024-02-04 DIAGNOSIS — D649 Anemia, unspecified: Secondary | ICD-10-CM | POA: Diagnosis not present

## 2024-02-04 DIAGNOSIS — E785 Hyperlipidemia, unspecified: Secondary | ICD-10-CM | POA: Insufficient documentation

## 2024-02-04 LAB — BASIC METABOLIC PANEL WITH GFR
Anion gap: 13 (ref 5–15)
BUN: 27 mg/dL — ABNORMAL HIGH (ref 8–23)
CO2: 26 mmol/L (ref 22–32)
Calcium: 9 mg/dL (ref 8.9–10.3)
Chloride: 100 mmol/L (ref 98–111)
Creatinine, Ser: 1.54 mg/dL — ABNORMAL HIGH (ref 0.44–1.00)
GFR, Estimated: 34 mL/min — ABNORMAL LOW
Glucose, Bld: 110 mg/dL — ABNORMAL HIGH (ref 70–99)
Potassium: 4.1 mmol/L (ref 3.5–5.1)
Sodium: 139 mmol/L (ref 135–145)

## 2024-02-04 LAB — CBC WITH DIFFERENTIAL/PLATELET
Abs Immature Granulocytes: 0.01 K/uL (ref 0.00–0.07)
Basophils Absolute: 0 K/uL (ref 0.0–0.1)
Basophils Relative: 1 %
Eosinophils Absolute: 0.1 K/uL (ref 0.0–0.5)
Eosinophils Relative: 1 %
HCT: 33.3 % — ABNORMAL LOW (ref 36.0–46.0)
Hemoglobin: 10.3 g/dL — ABNORMAL LOW (ref 12.0–15.0)
Immature Granulocytes: 0 %
Lymphocytes Relative: 22 %
Lymphs Abs: 1.5 K/uL (ref 0.7–4.0)
MCH: 27.2 pg (ref 26.0–34.0)
MCHC: 30.9 g/dL (ref 30.0–36.0)
MCV: 87.9 fL (ref 80.0–100.0)
Monocytes Absolute: 0.4 K/uL (ref 0.1–1.0)
Monocytes Relative: 6 %
Neutro Abs: 4.5 K/uL (ref 1.7–7.7)
Neutrophils Relative %: 70 %
Platelets: 249 K/uL (ref 150–400)
RBC: 3.79 MIL/uL — ABNORMAL LOW (ref 3.87–5.11)
RDW: 13.5 % (ref 11.5–15.5)
WBC: 6.5 K/uL (ref 4.0–10.5)
nRBC: 0 % (ref 0.0–0.2)

## 2024-02-04 LAB — TROPONIN T, HIGH SENSITIVITY: Troponin T High Sensitivity: 18 ng/L (ref 0–19)

## 2024-02-04 LAB — MAGNESIUM: Magnesium: 1.9 mg/dL (ref 1.7–2.4)

## 2024-02-04 MED ORDER — ACETAMINOPHEN 325 MG PO TABS: 650.0000 mg | ORAL_TABLET | Freq: Four times a day (QID) | ORAL | Status: DC | PRN

## 2024-02-04 MED ORDER — SODIUM CHLORIDE 0.9 % IV BOLUS
500.0000 mL | Freq: Once | INTRAVENOUS | Status: AC
  Administered 2024-02-04: 500 mL via INTRAVENOUS

## 2024-02-04 MED ORDER — LACTATED RINGERS IV BOLUS
1000.0000 mL | Freq: Once | INTRAVENOUS | Status: AC
  Administered 2024-02-04: 1000 mL via INTRAVENOUS

## 2024-02-04 MED ORDER — SENNOSIDES-DOCUSATE SODIUM 8.6-50 MG PO TABS: 1.0000 | ORAL_TABLET | Freq: Every evening | ORAL | Status: DC | PRN

## 2024-02-04 MED ORDER — ACETAMINOPHEN 650 MG RE SUPP: 650.0000 mg | Freq: Four times a day (QID) | RECTAL | Status: DC | PRN

## 2024-02-04 MED ORDER — ONDANSETRON HCL 4 MG/2ML IJ SOLN: 4.0000 mg | Freq: Four times a day (QID) | INTRAMUSCULAR | Status: DC | PRN

## 2024-02-04 MED ORDER — ONDANSETRON HCL 4 MG PO TABS: 4.0000 mg | ORAL_TABLET | Freq: Four times a day (QID) | ORAL | Status: DC | PRN

## 2024-02-04 MED ORDER — ACETAMINOPHEN 500 MG PO TABS
1000.0000 mg | ORAL_TABLET | Freq: Once | ORAL | Status: AC
  Administered 2024-02-04: 1000 mg via ORAL
  Filled 2024-02-04: qty 2

## 2024-02-04 MED ORDER — BISACODYL 5 MG PO TBEC: 5.0000 mg | DELAYED_RELEASE_TABLET | Freq: Every day | ORAL | Status: DC | PRN

## 2024-02-04 MED ORDER — LACTATED RINGERS IV SOLN: INTRAVENOUS | Status: AC

## 2024-02-04 MED ORDER — ENOXAPARIN SODIUM 30 MG/0.3ML IJ SOSY
30.0000 mg | PREFILLED_SYRINGE | INTRAMUSCULAR | Status: DC
  Administered 2024-02-04 – 2024-02-05 (×2): 30 mg via SUBCUTANEOUS
  Filled 2024-02-04 (×2): qty 0.3

## 2024-02-04 NOTE — Consult Note (Addendum)
 "  Cardiology Consultation   Patient ID: [name] MRN: [id number]; DOB: [birth year]  Admit date: 02/04/2024 Date of Consult: 02/04/2024  PCP:  Freddrick No   [facility] Providers Cardiologist: New to Dr Kriste    Patient Profile: [name] is a 79 y.o. female with a hx of chronic pain, HTN, type 2 DM,  cognitive impairment, who is being seen 02/04/2024 for the evaluation of bradycardia at the request of Dr Ernesta.  History of Present Illness: [name] with above PMH who presented to ER for headache. She has had numerous ER visit in the past for various body pain complaints. She has no known cardiac issues. Echo from 10/205 showed LVEF 65%, normal WM, normal RV, mild TR, PA peak . Cath from 10/2013 showed no CAD (handwriting not clear but graph without disease). She states she was feeling dizzy on the bus today and the bus driver called ambulance for her. She felt dizziness is new. She did not pass out. She has pain all over her body. She states she takes all her medication but does not know what they are. She states she can't see very well because her eyes are bad. She is normally hypertensive with SBP up to 200s per ER staff who know her very well. She was hypotensive 99/46 at ER initially which was unusual for her. She was noted bradycardiac down to high 20s at ER. Zolls were placed. Cardiology called for consultation. She denied any SOB, leg edema, orthopnea. Bed bug was reported by ER staff,   Per ER workup, BMP with Cr 1.54, BUN 27, GFR 34. CBC diff showed Hgb 10.3. EKG showed sinus bradycardia 49bpm.    Past Medical History:  Diagnosis Date   Diabetes mellitus without complication (HCC)    GERD 09/04/2006   Qualifier: Diagnosis of  By: Lazaro Aquas     History of echocardiogram    a. 2D ECHO: 11/06/2013 EF 65%; no WMA. Mild TR. PA pk pressure 43 mm HG   Hypertension    Stroke [facility])     Past Surgical History:  Procedure Laterality Date   ABDOMINAL  HYSTERECTOMY     ANTERIOR CERVICAL DECOMPRESSION/DISCECTOMY FUSION 4 LEVELS N/A 11/11/2019   Procedure: Cervical three-four Cervical four-five Cervical five-six Cervical six-seven  Anterior cervical decompression/discectomy/fusion;  Surgeon: Unice Pac, MD;  Location: [facility] OR;  Service: Neurosurgery;  Laterality: N/A;   LEFT HEART CATHETERIZATION WITH CORONARY ANGIOGRAM N/A 11/05/2013   Procedure: LEFT HEART CATHETERIZATION WITH CORONARY ANGIOGRAM;  Surgeon: Debby DELENA Sor, MD;  Location: [facility] CATH LAB;  Service: Cardiovascular;  Laterality: N/A;     Home Medications:  Prior to Admission medications  Medication Sig Start Date End Date Taking? Authorizing Provider  amLODipine  (NORVASC ) 10 MG tablet Take 1 tablet (10 mg total) by mouth daily. 01/14/24 02/13/24 Yes [name] BROCKS, PA  chlorthalidone  (HYGROTON ) 25 MG tablet Take 1 tablet (25 mg total) by mouth daily. 01/14/24 01/13/25 Yes [name] BROCKS, PA  cloNIDine  (CATAPRES ) 0.2 MG tablet Take 1 tablet (0.2 mg total) by mouth 2 (two) times daily. Patient taking differently: Take 0.2 mg by mouth daily. 01/14/24  Yes [name] BROCKS, PA  hydrOXYzine  (ATARAX ) 25 MG tablet Take 1 tablet (25 mg total) by mouth every 8 (eight) hours as needed. 01/14/24  Yes [name] BROCKS, PA  lidocaine  (LIDODERM ) 5 % Place 1 patch onto the skin daily. Remove & Discard patch within 12 hours or as directed by MD 01/20/24  Yes Logan Martinis  B, PA-C  metFORMIN  (GLUCOPHAGE ) 500 MG tablet Take 1 tablet (500 mg total) by mouth 2 (two) times daily with a meal. 12/13/23 04/04/24 Yes Sponseller, Rebekah R, PA-C  methocarbamol  (ROBAXIN ) 500 MG tablet Take 1 tablet (500 mg total) by mouth every 8 (eight) hours as needed for muscle spasms. 01/10/24  Yes Theadore Ozell HERO, MD  metoprolol  succinate (TOPROL -XL) 25 MG 24 hr tablet Take 1 tablet (25 mg total) by mouth daily. 01/14/24  Yes [name] BROCKS, PA  pantoprazole  (PROTONIX ) 20 MG tablet Take 1 tablet (20 mg  total) by mouth daily. 01/14/24  Yes [name] BROCKS, PA  rosuvastatin  (CRESTOR ) 10 MG tablet Take 1 tablet (10 mg total) by mouth at bedtime. Patient taking differently: Take 10 mg by mouth daily. 01/14/24  Yes [name] BROCKS, PA  diclofenac  Sodium (VOLTAREN ) 1 % GEL Apply 4 g topically 4 (four) times daily. Patient not taking: Reported on 02/04/2024 01/26/24   Ula Prentice SAUNDERS, MD    Scheduled Meds:   Continuous Infusions:  PRN Meds:   Allergies:   Allergies[1]  Social History:   Social History   Socioeconomic History   Marital status: Widowed    Spouse name: Not on file   Number of children: Not on file   Years of education: Not on file   Highest education level: Not on file  Occupational History   Not on file  Tobacco Use   Smoking status: Never   Smokeless tobacco: Never  Vaping Use   Vaping status: Never Used  Substance and Sexual Activity   Alcohol  use: No   Drug use: No   Sexual activity: Not Currently  Other Topics Concern   Not on file  Social History Narrative   Not on file   Social Drivers of Health   Tobacco Use: Low Risk (01/26/2024)   Patient History    Smoking Tobacco Use: Never    Smokeless Tobacco Use: Never    Passive Exposure: Not on file  Financial Resource Strain: Not on file  Food Insecurity: Not on file  Transportation Needs: Not on file  Physical Activity: Not on file  Stress: Not on file  Social Connections: Not on file  Intimate Partner Violence: Not on file  Depression (PHQ2-9): Low Risk (06/15/2021)   Depression (PHQ2-9)    PHQ-2 Score: 0  Alcohol  Screen: Not on file  Housing: Not on file  Utilities: Not on file  Health Literacy: Not on file    Family History:   No family history on file.   ROS:  Constitutional: Denied fever, chills, malaise, night sweats Eyes: Denied vision change or loss Ears/Nose/Mouth/Throat: Denied ear ache, sore throat, coughing, sinus pain Cardiovascular: see HPI  Respiratory: see HPI   Gastrointestinal: Denied nausea, vomiting, abdominal pain, diarrhea Genital/Urinary: Denied dysuria, hematuria, urinary frequency/urgency Musculoskeletal: Denied muscle ache, joint pain, weakness Skin: Denied rash, wound Neuro: see HPI  Psych: Denied history of depression/anxiety  Endocrine: history of diabetes     Physical Exam/Data: Vitals:   02/04/24 1538 02/04/24 1600 02/04/24 1630 02/04/24 1700  BP:  (!) 116/50 (!) 119/55 (!) 122/59  Pulse: (!) 41 (!) 44 (!) 36 (!) 43  Resp: 14 14 15 14   Temp:    97.9 F (36.6 C)  TempSrc:    Oral  SpO2: 100% 100% 100% 100%   No intake or output data in the 24 hours ending 02/04/24 1749    01/26/2024    7:31 PM 01/14/2024   12:23 PM 12/23/2023  6:44 PM  Last 3 Weights  Weight (lbs) 136 lb 0.4 oz 136 lb 119 lb  Weight (kg) 61.7 kg 61.689 kg 53.978 kg     There is no height or weight on file to calculate BMI.   Vitals:  Vitals:   02/04/24 1630 02/04/24 1700  BP: (!) 119/55 (!) 122/59  Pulse: (!) 36 (!) 43  Resp: 15 14  Temp:  97.9 F (36.6 C)  SpO2: 100% 100%   General Appearance: In no apparent distress, laying in bed HEENT: Normocephalic, atraumatic.  Neck: Supple, trachea midline, no JVDs Cardiovascular: Regular, bradycardiac, normal S1-S2,  no murmur Respiratory: Resting breathing unlabored, lungs sounds clear to auscultation bilaterally, no use of accessory muscles. On room air.   Gastrointestinal: Bowel sounds positive, abdomen soft Extremities: Able to move all extremities in bed without difficulty, no edema of BLE  Musculoskeletal: Normal muscle bulk and tone Skin: Intact, warm, dry Neurologic: Alert, oriented to person, place and time. Limited insight  Psychiatric: anxious     EKG:  The EKG was personally reviewed and demonstrates:    EKG today showed sinus bradycardia 49bpm  Telemetry:  Telemetry was personally reviewed and demonstrates:    Sinus bradycardia 40s with occasional PACs   Relevant CV  Studies:   Echo 11/06/13:  Study Conclusions   - Left ventricle: The cavity size was normal. Wall thickness was    normal. The estimated ejection fraction was 65%. Wall motion was    normal; there were no regional wall motion abnormalities.  - Right ventricle: The cavity size was normal. Systolic function    was normal.  - Tricuspid valve: There was mild regurgitation.  - Pulmonary arteries: PA peak pressure: 43 mm Hg (S). (Slightly    lower than last study)     Laboratory Data: High Sensitivity Troponin:  No results for input(s): TROPONINIHS in the last 720 hours.  Recent Labs  Lab 02/04/24 1618  TRNPT 18      Chemistry Recent Labs  Lab 02/04/24 1427  NA 139  K 4.1  CL 100  CO2 26  GLUCOSE 110*  BUN 27*  CREATININE 1.54*  CALCIUM  9.0  MG 1.9  GFRNONAA 34*  ANIONGAP 13    No results for input(s): PROT, ALBUMIN, AST, ALT, ALKPHOS, BILITOT in the last 168 hours. Lipids No results for input(s): CHOL, TRIG, HDL, LABVLDL, LDLCALC, CHOLHDL in the last 168 hours.  Hematology Recent Labs  Lab 02/04/24 1427  WBC 6.5  RBC 3.79*  HGB 10.3*  HCT 33.3*  MCV 87.9  MCH 27.2  MCHC 30.9  RDW 13.5  PLT 249   Thyroid  No results for input(s): TSH, FREET4 in the last 168 hours.  BNPNo results for input(s): BNP, PROBNP in the last 168 hours.  DDimer No results for input(s): DDIMER in the last 168 hours.  Radiology/Studies:  No results found.   Assessment and Plan:  Sinus bradycardia  - presented with headache and dizziness which resolved  - HR down to high 20s at ER, normally hypertensive but today her BP was 99/46 - EKG and telemetry without high grade AVB - suspect drug overdose, she seems to not know what medication she takes and states she took them all, including metoprolol   - allow 48 hours beta blocker wash out, continue telemetry monitor  - she is asympatomatic, with cognitive impairment, also poor social circumstance, no  PPM indicated nor suitable   - consider check TSH   AKI, new  - Cr 1.54,  maybe hypotension driven, consider IVF hydration, hold home antihypertensive for now    Risk Assessment/Risk Scores:   For questions or updates, please contact [facility] Please consult [email or web address] for contact info under      Signed, Emeline FORBES Calender, DO  02/04/2024 5:49 PM   Patient seen and examined, note reviewed with the signed Advanced Practice Provider. I personally reviewed laboratory data, imaging studies and relevant notes. I independently examined the patient and formulated the important aspects of the plan. I have personally discussed the plan with the patient and/or family. Comments or changes to the note/plan are indicated below.  Patient profile: This is a 79 year old female with history of chronic pain, hypertension, type 2 diabetes, cognitive impairment, recurrent hospitalizations, difficult social history and cardiology was consulted due to bradycardia.  Patient is a poor historian but reports that she has been having difficulty seeing and is not sure if she took her medications or not or how much today.  She states that she is starting to feel better currently and her bradycardia has improved.  My Exam:  Physical Exam Vitals and nursing note reviewed.  Constitutional:      General: She is not in acute distress.    Appearance: Normal appearance.  HENT:     Head: Normocephalic and atraumatic.  Eyes:     Conjunctiva/sclera: Conjunctivae normal.  Cardiovascular:     Rate and Rhythm: Bradycardia present.  Pulmonary:     Effort: Pulmonary effort is normal.  Musculoskeletal:        General: No swelling.  Neurological:     Mental Status: She is alert.      Telemetry: Sinus bradycardia with PACs- Personally reviewed   Assessment & Plan:  Sinus bradycardia with PACs, improving-there is concern that the patient may have accidentally overdosed on her antihypertensive  medications including her beta-blocker.  Heart rate was in the 20s earlier today, currently in the 50s.  She is currently not a candidate for pacemaker implantation and would wait for beta-blocker washout History of poorly controlled hypertension and today with normotension-likely related to medication noncompliance and potentially taking too many medications.  Would hold any antihypertensives at this time and slowly add back.  Ideally would not restart clonidine  due to the patient's noncompliance and risk for rebound hypertension AKI, possibly due to hypotension versus antihypertensives-consider IV hydration   Signed, Emeline Calender, DO [place]  [facility]  02/04/2024 5:49 PM       [1]  Allergies Allergen Reactions   Codeine Nausea Only   Imdur [Isosorbide Nitrate] Other (See Comments)    Stomach pain Headache   Motrin  [Ibuprofen ] Hypertension   "

## 2024-02-04 NOTE — ED Notes (Addendum)
 Pt refused to change into a gown and IV placement. She also wanted this RN to lend her money,

## 2024-02-04 NOTE — ED Provider Notes (Signed)
 Pt presenting to ED with chronic complaints headache, seen with regular frequency for this issue  However it was noted on this evaluation she had bradycardia HR reportedly into the low 30's from earlier provider  ECG showing sinus bradycardia, no evident heart block, no acute ischemia  Patient has cognitive delays  Also has chronic bed bugs  Pending cardiology consultation   Physical Exam  BP (!) 117/55   Pulse (!) 41   Temp (!) 97.5 F (36.4 C)   Resp 14   SpO2 100%   Physical Exam  Procedures  Procedures  ED Course / MDM    Medical Decision Making Amount and/or Complexity of Data Reviewed Labs: ordered.  Risk OTC drugs. Decision regarding hospitalization.   Cardiology consultation completed, see consult note, plan for hospitalist admission for medication washout and monitoring of bradycardia.  Patient remained stable with no symptomatic complaints.  Patient admitted to the hospitalist Dr.Amposah at 625 pm        Cottie Donnice PARAS, MD 02/04/24 [phone]

## 2024-02-04 NOTE — ED Triage Notes (Signed)
 Patient [place]IB [facility] from bus stop for headache and blurry vision. Bus driver called for her. EMS reports patient refused cbg, no unilateral deficits noted.  BP 110/60 HR 48 RR 15 98% RA

## 2024-02-04 NOTE — Discharge Instructions (Signed)
 He has been important to follow-up with your physician
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
## 2024-02-04 NOTE — ED Notes (Signed)
 Placed defib pads on pt and connected to [facility]. Pt refused to take her bra off. All other clothing removed.

## 2024-02-04 NOTE — ED Notes (Signed)
 Noticed bed bugs on the bed. Per Bed bug protocol, this RN advised pt that we need to undress her. Pt loud and refused to undress.

## 2024-02-05 DIAGNOSIS — N179 Acute kidney failure, unspecified: Secondary | ICD-10-CM

## 2024-02-05 DIAGNOSIS — R001 Bradycardia, unspecified: Secondary | ICD-10-CM | POA: Diagnosis not present

## 2024-02-05 LAB — CBC
HCT: 30.8 % — ABNORMAL LOW (ref 36.0–46.0)
Hemoglobin: 9.7 g/dL — ABNORMAL LOW (ref 12.0–15.0)
MCH: 27.4 pg (ref 26.0–34.0)
MCHC: 31.5 g/dL (ref 30.0–36.0)
MCV: 87 fL (ref 80.0–100.0)
Platelets: 210 K/uL (ref 150–400)
RBC: 3.54 MIL/uL — ABNORMAL LOW (ref 3.87–5.11)
RDW: 13.5 % (ref 11.5–15.5)
WBC: 5.8 K/uL (ref 4.0–10.5)
nRBC: 0 % (ref 0.0–0.2)

## 2024-02-05 LAB — TSH: TSH: 1.5 u[IU]/mL (ref 0.350–4.500)

## 2024-02-05 LAB — BASIC METABOLIC PANEL WITH GFR
Anion gap: 9 (ref 5–15)
BUN: 25 mg/dL — ABNORMAL HIGH (ref 8–23)
CO2: 28 mmol/L (ref 22–32)
Calcium: 8.6 mg/dL — ABNORMAL LOW (ref 8.9–10.3)
Chloride: 102 mmol/L (ref 98–111)
Creatinine, Ser: 1.34 mg/dL — ABNORMAL HIGH (ref 0.44–1.00)
GFR, Estimated: 41 mL/min — ABNORMAL LOW
Glucose, Bld: 115 mg/dL — ABNORMAL HIGH (ref 70–99)
Potassium: 3.9 mmol/L (ref 3.5–5.1)
Sodium: 140 mmol/L (ref 135–145)

## 2024-02-05 LAB — CBG MONITORING, ED: Glucose-Capillary: 133 mg/dL — ABNORMAL HIGH (ref 70–99)

## 2024-02-05 LAB — MAGNESIUM: Magnesium: 1.7 mg/dL (ref 1.7–2.4)

## 2024-02-05 MED ORDER — ROSUVASTATIN CALCIUM 5 MG PO TABS
10.0000 mg | ORAL_TABLET | Freq: Every day | ORAL | Status: DC
  Administered 2024-02-05: 10 mg via ORAL
  Filled 2024-02-05: qty 2

## 2024-02-05 MED ORDER — PANTOPRAZOLE SODIUM 20 MG PO TBEC
20.0000 mg | DELAYED_RELEASE_TABLET | Freq: Every day | ORAL | Status: DC
  Administered 2024-02-05: 20 mg via ORAL
  Filled 2024-02-05 (×2): qty 1

## 2024-02-05 MED ORDER — POLYVINYL ALCOHOL 1.4 % OP SOLN: 1.0000 [drp] | OPHTHALMIC | Status: DC | PRN

## 2024-02-05 MED ORDER — LIDOCAINE 5 % EX PTCH
1.0000 | MEDICATED_PATCH | CUTANEOUS | Status: DC
  Administered 2024-02-05: 1 via TRANSDERMAL
  Filled 2024-02-05: qty 1

## 2024-02-05 NOTE — Care Management Obs Status (Signed)
 MEDICARE OBSERVATION STATUS NOTIFICATION   Patient Details  Name: [name] MRN: [id number] Date of Birth: 11-16-1945   Medicare Observation Status Notification Given:  Yes    Debarah Saunas, RN 02/05/2024, 9:15 AM

## 2024-02-05 NOTE — ED Notes (Signed)
 Kitchen called for replaced breakfast. Patient stating the cafeteria ought to be ashamed of their selves sending me bacon with my high blood pressure. Patient verbally deescalated by RN. Breakfast ordered.
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
## 2024-02-05 NOTE — Progress Notes (Signed)
 " PROGRESS NOTE [name]  FMW:[id number] DOB: 1945-01-25 DOA: 02/04/2024 PCP: Pcp, No  Brief Narrative/Hospital Course: [name] is a 79 y.o. female with PMH of r chronic headache, moderate cognitive impairment, HTN, T2DM, GERD, and frequent ED visits for recurrent headaches and various body pain complaints who presented to the ED for evaluation of dizziness and headache Patient lives alone but does not see well due to some blurriness in her vision and took some of her medications but did not know which ones.While attempted to get into the bus today, she felt dizziness with associated headache so the bus driver called EMS. She reports feeling well now with improvement in her dizziness and headache. She denies any syncope, chest pain, nausea, vomiting, abdominal pain, fevers or chills. States she does not want to be discharged late during the day as she has trouble seeing when she leaves the hospital at night. ED Course: Initial vitals show patient afebrile, RR 14-23, HR 38-50s, SBP 110-140s, SpO2 100% on room air. Initial labs significant for creatinine 1.54, Hgb 10.3, normal troponin and magnesium . EKG shows sinus bradycardia with PACs. Pt received IV NS 500 cc bolus and Tylenol  1 g.  Cardiology was consulted for evaluation. TRH was consulted for admission.    Subjective: Seen and examined today C/o blurry eys and pain all over and asking for eye drops No dizziness chest pain Overnight heart rate in 40s, vitals stable otherwise, not hypoxic  Labs stable CBC with chronic anemia, creatinine down to 1.3 from 1.5 mag 1.7   Assessment and plan:  Sinus bradycardia with PAC Hypertension on multiple regimen: Suspect accidental overdose of antihypertensives including beta-blocker.  Appreciate cardiology input, heart rate slowly improving and BP is maintained.  Would not start beta-blocker on discharge.  PTA on amlodipine  chlorthalidone  clonidine  metoprolol  succinate-slowly add back antihypertensive.   Continue PT OT  AKI: baseline of 0.9-1.1,Likely secondary to mild dehydration which is likely also contributed to her dizziness.  Continue IV fluid hydration creatinine is improving Recent Labs  Lab 02/04/24 1427 02/05/24 0320  BUN 27* 25*  CREATININE 1.54* 1.34*   Chronic anemia: Mild anemia noted, stable, monitor  Type 2 diabetes on metformin : Resume metformin  slowly. Cbg stable.   Chronic headache Patient has had multiple ED visits for this over the last few years, known by ED  providers and staff Stable continue analgesics as needed    Hx of bedbugs Per ED staff, a bedbug was found on patient, Continue contact precautions  GERD: Continue PPI   HLD : Continue statin   social determinants of health Patient lives alone and has had chronic vision problems making it difficult for her to manage her medical problems at her age. She has had multiple ED visits over the last few years. TOC consulted for assistance with resources (home health aide, placement or home health nurse).  Obtain PT OT  Mobility: PT Orders: Active PT Follow up Rec:     DVT prophylaxis: enoxaparin  (LOVENOX ) injection 30 mg Start: 02/04/24 2130 Code Status:   Code Status: Full Code Family Communication: plan of care discussed with patient at bedside. Patient status is: Remains hospitalized because of severity of illness Level of care: Telemetry   Dispo: The patient is from: home            Anticipated disposition: TBD Objective: Vitals last 24 hrs: Vitals:   02/05/24 0814 02/05/24 1100 02/05/24 1155 02/05/24 1215  BP:  (!) 132/57  (!) 141/56  Pulse:  (!) 52  60  Resp:  19  18  Temp: (!) 97.5 F (36.4 C)  97.6 F (36.4 C)   TempSrc: Oral     SpO2:  100%  100%    Physical Examination: General exam: alert awake, oriented, older than stated age HEENT:Oral mucosa moist, Ear/Nose WNL grossly Respiratory system: Bilaterally clear BS,no use of accessory muscle Cardiovascular system: S1 & S2 +, No  JVD. Gastrointestinal system: Abdomen soft,NT,ND, BS+ Nervous System: Alert, awake, moving all extremities,and following commands. Extremities: extremities warm, leg edema neg Skin: Warm, no rashes MSK: Normal muscle bulk,tone, power   Medications reviewed:  Scheduled Meds:  enoxaparin  (LOVENOX ) injection  30 mg Subcutaneous Q24H   lidocaine   1 patch Transdermal Q24H   pantoprazole   20 mg Oral Daily   rosuvastatin   10 mg Oral QHS   Continuous Infusions:  lactated ringers  100 mL/hr at 02/05/24 9170   Diet: Diet Order             Diet regular Room service appropriate? Yes; Fluid consistency: Thin  Diet effective now                     Unresulted Labs (From admission, onward)     Start     Ordered   02/06/24 0500  CBC  Daily,   R      02/05/24 0729   02/06/24 0500  Basic metabolic panel with GFR  Daily,   R      02/05/24 0729           Data Reviewed: I have personally reviewed following labs and imaging studies ( see epic result tab) CBC: Recent Labs  Lab 02/04/24 1427 02/05/24 0320  WBC 6.5 5.8  NEUTROABS 4.5  --   HGB 10.3* 9.7*  HCT 33.3* 30.8*  MCV 87.9 87.0  PLT 249 210   CMP: Recent Labs  Lab 02/04/24 1427 02/05/24 0320  NA 139 140  K 4.1 3.9  CL 100 102  CO2 26 28  GLUCOSE 110* 115*  BUN 27* 25*  CREATININE 1.54* 1.34*  CALCIUM  9.0 8.6*  MG 1.9 1.7   GFR: Estimated Creatinine Clearance: 30.4 mL/min (A) (by C-G formula based on SCr of 1.34 mg/dL (H)). No results for input(s): AST, ALT, ALKPHOS, BILITOT, PROT, ALBUMIN in the last 168 hours. No results for input(s): LIPASE, AMYLASE in the last 168 hours. No results for input(s): AMMONIA in the last 168 hours. Coagulation Profile: No results for input(s): INR, PROTIME in the last 168 hours. Antimicrobials/Microbiology: Anti-infectives (From admission, onward)    None         Component Value Date/Time   SDES URINE, RANDOM 11/27/2019 0309   SPECREQUEST NONE  11/27/2019 0309   CULT (A) 11/27/2019 0309    <10,000 COLONIES/mL INSIGNIFICANT GROWTH Performed at [facility], [address], [place], [place] [address]    REPTSTATUS 11/29/2019 FINAL 11/27/2019 0309    Procedures:    Mennie LAMY, MD [facility] 02/05/2024, 1:20 PM   "

## 2024-02-05 NOTE — Progress Notes (Signed)
 "  Progress Note  Patient Name: [name] Date of Encounter: 02/05/2024  Primary Cardiologist: None   Subjective   Complaining of poor vision and asking for eyedrops.  Also complaining of diffuse bodyaches.  No chest pain or any other complaints  Inpatient Medications    Scheduled Meds:  enoxaparin  (LOVENOX ) injection  30 mg Subcutaneous Q24H   Continuous Infusions:  lactated ringers  100 mL/hr at 02/04/24 2147   PRN Meds: acetaminophen  **OR** acetaminophen , bisacodyl , ondansetron  **OR** ondansetron  (ZOFRAN ) IV, senna-docusate   Vital Signs    Vitals:   02/05/24 0430 02/05/24 0445 02/05/24 0500 02/05/24 0515  BP: (!) 132/54 (!) 141/60 (!) 144/57 (!) 130/54  Pulse: (!) 45 (!) 48 (!) 48 (!) 48  Resp: 14 13 13 14   Temp:      TempSrc:      SpO2: 100% 100% 100% 100%    Intake/Output Summary (Last 24 hours) at 02/05/2024 9287 Last data filed at 02/04/2024 1905 Gross per 24 hour  Intake 500 ml  Output --  Net 500 ml   There were no vitals filed for this visit.  Telemetry    Normal sinus rhythm- Personally Reviewed  ECG    Sinus bradycardia with PACs- Personally Reviewed  Physical Exam   Physical Exam Vitals and nursing note reviewed.  Constitutional:      Appearance: Normal appearance.  HENT:     Head: Normocephalic and atraumatic.  Eyes:     Conjunctiva/sclera: Conjunctivae normal.  Cardiovascular:     Rate and Rhythm: Normal rate.     Pulses: Normal pulses.  Pulmonary:     Effort: Pulmonary effort is normal.  Neurological:     Mental Status: She is alert.      Labs    Chemistry Recent Labs  Lab 02/04/24 1427 02/05/24 0320  NA 139 140  K 4.1 3.9  CL 100 102  CO2 26 28  GLUCOSE 110* 115*  BUN 27* 25*  CREATININE 1.54* 1.34*  CALCIUM  9.0 8.6*  GFRNONAA 34* 41*  ANIONGAP 13 9     Hematology Recent Labs  Lab 02/04/24 1427 02/05/24 0320  WBC 6.5 5.8  RBC 3.79* 3.54*  HGB 10.3* 9.7*  HCT 33.3* 30.8*  MCV 87.9 87.0  MCH 27.2 27.4   MCHC 30.9 31.5  RDW 13.5 13.5  PLT 249 210    Cardiac EnzymesNo results for input(s): TROPONINI in the last 168 hours. No results for input(s): TROPIPOC in the last 168 hours.   BNPNo results for input(s): BNP, PROBNP in the last 168 hours.   DDimer No results for input(s): DDIMER in the last 168 hours.   Radiology    No results found.  Cardiac Studies     Patient Profile     This is a 79 year old female with history of chronic pain, hypertension, type 2 diabetes, recurrent hospitalizations, cognitive impairment, recurrent hospitalizations, difficult social history who presented with various body complaints, headaches, dizziness and EMS was contacted by a bus driver found to be bradycardic in the ED and normotensive (typically hypertensive) and cardiology was consulted due to bradycardia.    Assessment & Plan   Sinus bradycardia with PACs, resolved-now normal sinus rhythm with rates in the 60s there is concern that the patient may have accidentally overdosed on her antihypertensive medications including her beta-blocker.  Heart rate was in the 20s in the ED, currently in the 60s.  No indication for pacemaker at this time.  Would not restart beta-blockade at discharge History of poorly  controlled hypertension-BP was normal initially which is low for her.  Currently 130/57.  Initial presentation likely related to accidental taking too many antihypertensives (amlodipine , chlorthalidone , clonidine , metoprolol  succinate).  Would hold any antihypertensives at this time and slowly add back starting with low-dose amlodipine .  Ideally would not restart clonidine  due to the patient's noncompliance and risk for rebound hypertension AKI, possibly due to hypotension versus antihypertensives-improving after receiving IV fluids Bedbugs Social determinants of health chronic visual issues making medical management difficult and recurrent ED visits   No further recommendations at this  time.  Cardiology will sign off.  Please not hesitate to reach back out with further questions.   For questions or updates, please contact [facility] Please consult [email or web address] for contact info under        Signed, Emeline Calender, DO 02/05/2024, 7:12 AM    "

## 2024-02-05 NOTE — Progress Notes (Signed)
 Pt arrived to 4E, oriented to unit, and CCMD notified. Pt refused shoe removal and skin check. Pt stated she was hungry but refused ordering food, claiming she has came to [facility] for many years and doesn't like the food. Will continue to monitor, offering snacks, and showing her different menu options. BP 138/65 (BP Location: Right Arm)   Pulse (!) 55   Temp 97.8 F (36.6 C) (Oral)   Resp 16   SpO2 97%

## 2024-02-05 NOTE — Progress Notes (Signed)
 Transition of Care [facility]) - Inpatient Brief Assessment   Patient Details  Name: [name] MRN: [id number] Date of Birth: Jul 28, 1945  [facility] CM/SW Contact:    Debarah Saunas, RN Phone Number: 02/05/2024, 9:18 AM   Clinical Narrative: RNCM consulted regarding safe disposition for pt.  RNCM met with pt at beside regarding discharge plan.  Pt plans on returning home with self care.  RNCM suggests psych consult to evaluate cognition and ability to care for self.  ICM will continue to follow for discharge needs.   Transition of Care Asessment: Insurance and Status: (P) Insurance coverage has been reviewed Patient has primary care physician: (P) No (pt prefers ED; several attempts to [facility] PCP) Home environment has been reviewed: (P) from home alone Prior level of function:: (P) independent Prior/Current Home Services: (P) No current home services Social Drivers of Health Review: (P) SDOH reviewed no interventions necessary Readmission risk has been reviewed: (P) No Transition of care needs: (P) transition of care needs identified, TOC will continue to follow

## 2024-02-05 NOTE — ED Notes (Signed)
 4E called to notify patient is coming up, aware of contact precautions due to bed bugs

## 2024-02-06 DIAGNOSIS — R001 Bradycardia, unspecified: Secondary | ICD-10-CM | POA: Diagnosis not present

## 2024-02-06 LAB — BASIC METABOLIC PANEL WITH GFR
Anion gap: 10 (ref 5–15)
BUN: 16 mg/dL (ref 8–23)
CO2: 27 mmol/L (ref 22–32)
Calcium: 8.7 mg/dL — ABNORMAL LOW (ref 8.9–10.3)
Chloride: 102 mmol/L (ref 98–111)
Creatinine, Ser: 1.08 mg/dL — ABNORMAL HIGH (ref 0.44–1.00)
GFR, Estimated: 53 mL/min — ABNORMAL LOW
Glucose, Bld: 99 mg/dL (ref 70–99)
Potassium: 3.6 mmol/L (ref 3.5–5.1)
Sodium: 139 mmol/L (ref 135–145)

## 2024-02-06 LAB — CBC
HCT: 32.6 % — ABNORMAL LOW (ref 36.0–46.0)
Hemoglobin: 10.4 g/dL — ABNORMAL LOW (ref 12.0–15.0)
MCH: 27.4 pg (ref 26.0–34.0)
MCHC: 31.9 g/dL (ref 30.0–36.0)
MCV: 85.8 fL (ref 80.0–100.0)
Platelets: 224 K/uL (ref 150–400)
RBC: 3.8 MIL/uL — ABNORMAL LOW (ref 3.87–5.11)
RDW: 13.4 % (ref 11.5–15.5)
WBC: 5.5 K/uL (ref 4.0–10.5)
nRBC: 0 % (ref 0.0–0.2)

## 2024-02-06 MED ORDER — CHLORTHALIDONE 25 MG PO TABS
25.0000 mg | ORAL_TABLET | Freq: Every day | ORAL | Status: DC
  Administered 2024-02-06: 25 mg via ORAL
  Filled 2024-02-06 (×2): qty 1

## 2024-02-06 NOTE — Discharge Summary (Signed)
 Physician Discharge Summary  [name] FMW:[id number] DOB: 09-Nov-1945 DOA: 02/04/2024  PCP: Pcp, No  Admit date: 02/04/2024 Discharge date: 02/06/2024 Recommendations for Outpatient Follow-up:  Follow up with PCP in 1 weeks-call for appointment Please obtain BMP/CBC in one week  Discharge Dispo: HOME W/ [facility] Discharge Condition: Stable Code Status:   Code Status: Full Code Diet recommendation:  Diet Order             Diet regular Room service appropriate? Yes; Fluid consistency: Thin  Diet effective now                    Brief/Interim Summary: [name] is a 79 y.o. female with PMH of r chronic headache, moderate cognitive impairment, HTN, T2DM, GERD, and frequent ED visits for recurrent headaches and various body pain complaints who presented to the ED for evaluation of dizziness and headache Patient lives alone but does not see well due to some blurriness in her vision and took some of her medications but did not know which ones.While attempted to get into the bus today, she felt dizziness with associated headache so the bus driver called EMS. She reports feeling well now with improvement in her dizziness and headache. She denies any syncope, chest pain, nausea, vomiting, abdominal pain, fevers or chills. States she does not want to be discharged late during the day as she has trouble seeing when she leaves the hospital at night. ED Course: Initial vitals show patient afebrile, RR 14-23, HR 38-50s, SBP 110-140s, SpO2 100% on room air. Initial labs significant for creatinine 1.54, Hgb 10.3, normal troponin and magnesium . EKG shows sinus bradycardia with PACs. Pt received IV NS 500 cc bolus and Tylenol  1 g.  Cardiology was consulted for evaluation. TRH was consulted for admission.    Subjective: Seen and examined  On Ra eager to go home Overnight heart rate improved to 50s to 60s, BP 140s-160s, on room air Labs stable CBC with chronic anemia, creatinine down to 1.0  from 1.5    Discharge Diagnoses:   Sinus bradycardia with PAC Hypertension on multiple regimen: Suspect accidental overdose of antihypertensives including beta-blocker.  Appreciate cardiology input, heart rate has improved-BP maintained/hypertensive, following provide avoid beta-blocker.PTA on amlodipine  chlorthalidone  clonidine  metoprolol  succinate Will resume chlorthalidone  at low-dose   AKI: It has resolved with IV fluid hydration, encourage oral intake  Recent Labs  Lab 02/04/24 1427 02/05/24 0320 02/06/24 0347  BUN 27* 25* 16  CREATININE 1.54* 1.34* 1.08*   Chronic anemia: Mild anemia noted, stable, monitor  Type 2 diabetes on metformin : Will control on ssi, resume metformin  on dc  Chronic headache Patient has had multiple ED visits for this over the last few years, known by ED  providers and staff Stable continue analgesics as needed    Hx of bedbugs Per ED staff, a bedbug was found on patient, Continue contact precautions  GERD: Continue PPI   HLD : Continue statin   Social determinants of health Patient lives alone and has had chronic vision problems making it difficult for her to manage her medical problems at her age. She has had multiple ED visits over the last few years. TOC consulted for assistance with resources (home health aide, placement or home health nurse).  Obtain PT OT-for disposition and advised HHPT  Mobility: PT Orders: Active PT Follow up Rec: Home Health Pt (& [facility]; Pt Declining Snf)02/06/2024 0800    DVT prophylaxis: enoxaparin  (LOVENOX ) injection 30 mg Start: 02/04/24 2130 Code  Status:   Code Status: Full Code Family Communication: plan of care discussed with patient at bedside. Patient status is: Remains hospitalized because of severity of illness Level of care: Telemetry   Dispo: The patient is from: home            Anticipated disposition: HOME Objective: Vitals last 24 hrs: Vitals:   02/05/24 1932 02/05/24 2334 02/06/24  0330 02/06/24 0824  BP: (!) 148/67 (!) 158/59 (!) 164/64 (!) 185/64  Pulse: 62 (!) 54 (!) 53 (!) 51  Resp: 20 16 20 18   Temp: 98.1 F (36.7 C) 98.3 F (36.8 C) 97.8 F (36.6 C) 97.8 F (36.6 C)  TempSrc: Oral Oral Oral Oral  SpO2: 100% 100% 99% 99%    Physical Examination: General exam: AAOX3 HEENT:Oral mucosa moist, Ear/Nose WNL grossly Respiratory system: Bilaterally clear BS,no use of accessory muscle Cardiovascular system: S1 & S2 +, No JVD. Gastrointestinal system: Abdomen soft,NT,ND, BS+ Nervous System: Alert, awake, moving all extremities,and following commands. Extremities: extremities warm, leg edema neg Skin: Warm, no rashes MSK: Normal muscle bulk,tone, power      Consultation: See note.  Discharge Instructions  Discharge Instructions     Discharge instructions   Complete by: As directed    Please call call MD or return to ER for similar or worsening recurring problem that brought you to hospital or if any fever,nausea/vomiting,abdominal pain, uncontrolled pain, chest pain,  shortness of breath or any other alarming symptoms.  Please follow-up your doctor as instructed in a week time and call the office for appointment.  Please avoid alcohol , smoking, or any other illicit substance and maintain healthy habits including taking your regular medications as prescribed.  You were cared for by a hospitalist during your hospital stay. If you have any questions about your discharge medications or the care you received while you were in the hospital after you are discharged, you can call the unit and ask to speak with the hospitalist on call if the hospitalist that took care of you is not available.  Once you are discharged, your primary care physician will handle any further medical issues. Please note that NO REFILLS for any discharge medications will be authorized once you are discharged, as it is imperative that you return to your primary care physician (or establish a  relationship with a primary care physician if you do not have one) for your aftercare needs so that they can reassess your need for medications and monitor your lab values   Face-to-face encounter (required for Medicare/Medicaid patients)   Complete by: As directed    I Mennie LAMY certify that this patient is under my care and that I, or a nurse practitioner or physician's assistant working with me, had a face-to-face encounter that meets the physician face-to-face encounter requirements with this patient on 02/06/2024. The encounter with the patient was in whole, or in part for the following medical condition(s) which is the primary reason for home health care (List medical condition): WEAKNESS DECONDITIONING   The encounter with the patient was in whole, or in part, for the following medical condition, which is the primary reason for home health care: WEAKNESS DECONDITIONING   I certify that, based on my findings, the following services are medically necessary home health services: Physical therapy   Reason for Medically Necessary Home Health Services: Therapy- Therapeutic Exercises to Increase Strength and Endurance   My clinical findings support the need for the above services: Unable to leave home safely without assistance and/or  assistive device   Further, I certify that my clinical findings support that this patient is homebound due to: Unable to leave home safely without assistance   Home Health   Complete by: As directed    To provide the following care/treatments:  PT OT     Increase activity slowly   Complete by: As directed       Allergies as of 02/06/2024       Reactions   Codeine Nausea Only   Imdur [isosorbide Nitrate] Other (See Comments)   Stomach pain Headache   Motrin  [ibuprofen ] Hypertension        Medication List     STOP taking these medications    amLODipine  10 MG tablet Commonly known as: NORVASC    cloNIDine  0.2 MG tablet Commonly known as: CATAPRES     metoprolol  succinate 25 MG 24 hr tablet Commonly known as: TOPROL -XL       TAKE these medications    chlorthalidone  25 MG tablet Commonly known as: HYGROTON  Take 1 tablet (25 mg total) by mouth daily.   diclofenac  Sodium 1 % Gel Commonly known as: Voltaren  Apply 4 g topically 4 (four) times daily.   hydrOXYzine  25 MG tablet Commonly known as: ATARAX  Take 1 tablet (25 mg total) by mouth every 8 (eight) hours as needed.   lidocaine  5 % Commonly known as: Lidoderm  Place 1 patch onto the skin daily. Remove & Discard patch within 12 hours or as directed by MD   metFORMIN  500 MG tablet Commonly known as: GLUCOPHAGE  Take 1 tablet (500 mg total) by mouth 2 (two) times daily with a meal.   methocarbamol  500 MG tablet Commonly known as: ROBAXIN  Take 1 tablet (500 mg total) by mouth every 8 (eight) hours as needed for muscle spasms.   pantoprazole  20 MG tablet Commonly known as: PROTONIX  Take 1 tablet (20 mg total) by mouth daily.   rosuvastatin  10 MG tablet Commonly known as: Crestor  Take 1 tablet (10 mg total) by mouth at bedtime. What changed: when to take this        Follow-up Information     Your physician In 1 day.                 Allergies[1]  The results of significant diagnostics from this hospitalization (including imaging, microbiology, ancillary and laboratory) are listed below for reference.    Microbiology: No results found for this or any previous visit (from the past 240 hours).  Procedures/Studies: No results found.  Labs: BNP (last 3 results) Recent Labs    06/18/23 1816  BNP 199.6*   Basic Metabolic Panel: Recent Labs  Lab 02/04/24 1427 02/05/24 0320 02/06/24 0347  NA 139 140 139  K 4.1 3.9 3.6  CL 100 102 102  CO2 26 28 27   GLUCOSE 110* 115* 99  BUN 27* 25* 16  CREATININE 1.54* 1.34* 1.08*  CALCIUM  9.0 8.6* 8.7*  MG 1.9 1.7  --    Liver Function Tests: No results for input(s): AST, ALT, ALKPHOS, BILITOT,  PROT, ALBUMIN in the last 168 hours. No results for input(s): LIPASE, AMYLASE in the last 168 hours. No results for input(s): AMMONIA in the last 168 hours. CBC: Recent Labs  Lab 02/04/24 1427 02/05/24 0320 02/06/24 0347  WBC 6.5 5.8 5.5  NEUTROABS 4.5  --   --   HGB 10.3* 9.7* 10.4*  HCT 33.3* 30.8* 32.6*  MCV 87.9 87.0 85.8  PLT 249 210 224   CBG: Recent Labs  Lab 02/05/24  9187  GLUCAP 133*   Hgb A1c No results for input(s): HGBA1C in the last 72 hours. Anemia work up No results for input(s): VITAMINB12, FOLATE, FERRITIN, TIBC, IRON, RETICCTPCT in the last 72 hours. Cardiac Enzymes: No results for input(s): CKTOTAL, CKMB, CKMBINDEX, TROPONINI in the last 168 hours. BNP: Invalid input(s): POCBNP D-Dimer No results for input(s): DDIMER in the last 72 hours. Lipid Profile No results for input(s): CHOL, HDL, LDLCALC, TRIG, CHOLHDL, LDLDIRECT in the last 72 hours. Thyroid  function studies Recent Labs    02/05/24 0320  TSH 1.500   Urinalysis    Component Value Date/Time   COLORURINE YELLOW 06/20/2023 1802   APPEARANCEUR HAZY (A) 06/20/2023 1802   LABSPEC 1.009 06/20/2023 1802   PHURINE 5.0 06/20/2023 1802   GLUCOSEU NEGATIVE 06/20/2023 1802   HGBUR NEGATIVE 06/20/2023 1802   BILIRUBINUR NEGATIVE 06/20/2023 1802   BILIRUBINUR negative 10/05/2020 1223   KETONESUR NEGATIVE 06/20/2023 1802   PROTEINUR NEGATIVE 06/20/2023 1802   UROBILINOGEN 0.2 10/05/2020 1223   UROBILINOGEN 0.2 12/14/2013 2035   NITRITE NEGATIVE 06/20/2023 1802   LEUKOCYTESUR SMALL (A) 06/20/2023 1802   Sepsis Labs Recent Labs  Lab 02/04/24 1427 02/05/24 0320 02/06/24 0347  WBC 6.5 5.8 5.5   Microbiology No results found for this or any previous visit (from the past 240 hours).   Time coordinating discharge: 25 minutes  SIGNED: Mennie LAMY, MD  [facility] 02/06/2024, 12:57 PM  If 7PM-7AM, please contact  night-coverage [email or web address]       [1]  Allergies Allergen Reactions   Codeine Nausea Only   Imdur [Isosorbide Nitrate] Other (See Comments)    Stomach pain Headache   Motrin  [Ibuprofen ] Hypertension

## 2024-02-06 NOTE — Evaluation (Addendum)
 Physical Therapy Evaluation Patient Details Name: [name] MRN: [id number] DOB: 09-22-1945 Today's Date: 02/06/2024  History of Present Illness  Roselie Cirigliano is a 79 y.o. female presented 02/04/24 to the ED for evaluation of dizziness and headache. Found to have bradycardia (possibly due to over dose of beta blocker and/or amlodipine ) due to poor vision and AKI.  PHMx: chronic headache, moderate cognitive impairment, HTN, T2DM, GERD, cervical 4 level fusion, CVA, low vision, and frequent ED visits for recurrent headaches and various body pain complaints.   Clinical Impression  Pt presents with condition above and deficits mentioned below, see PT Problem List. PTA, she was mod I intermittently using a RW inside and no AD outside. She lives alone in a 1-level house with 1 STE. She has a daughter that lives nearby, but she works and cannot help her often. The pt is currently a bit reluctant to answer many questions and is not very receptive to education and recs. Per chart, she has some cognitive deficits which might be impacting her comprehension of her deficits and safety. She currently displays deficits in vision (difficult to fully assess as pt would provide vague answers and not follow all cues to test, seems more behavioral than an inability to do so), balance, and activity tolerance. Her balance is likely impacted by her poor vision, but despite the vision deficits she was able to see and navigate around obstacles without assistance. She is currently mobilizing mildly unsteadily but without LOB, AD, or physical assistance. It is concerning she lives at home alone without much assistance available to her and she is having issues managing her medications appropriately due to her vision deficits. She states I can't go anywhere. I need to go home when SNF rehab was mentioned. She would likely mobilize better in her familiar home environment though. Attempted to educate pt on assistive devices for  medication management for pts with low vision. She initially seemed receptive to it but then stated I can't go anywhere right now and when offered [facility] services, including Low Vision Therapy, pt stated I don't want anyone coming to my home right now. I need to clean it up. Pt reluctant to accept education or recs at this time, but if pt denies SNF then would highly recommend at least HHPT and HH Low Vision therapy. Will continue to follow acutely.      If plan is discharge home, recommend the following: Assistance with cooking/housework;Assist for transportation;Help with stairs or ramp for entrance;Direct supervision/assist for medications management;Direct supervision/assist for financial management   Can travel by private vehicle        Equipment Recommendations None recommended by PT (reports she already has a RW and 3in1 at home)  Recommendations for Other Services       Functional Status Assessment Patient has had a recent decline in their functional status and demonstrates the ability to make significant improvements in function in a reasonable and predictable amount of time.     Precautions / Restrictions Precautions Precautions: Fall;Other (comment) Precaution/Restrictions Comments: low vision (blurry bil); bradycardic Required Braces or Orthoses:  (denied any fxs since recent fall but likes to wear soft neck brace for pain management) Restrictions Weight Bearing Restrictions Per Provider Order: No      Mobility  Bed Mobility Overal bed mobility: Needs Assistance Bed Mobility: Supine to Sit, Sit to Supine     Supine to sit: Supervision, HOB elevated Sit to supine: Supervision, HOB elevated   General bed mobility comments: HOB elevated, supervision  for safety    Transfers Overall transfer level: Needs assistance Equipment used: None Transfers: Sit to/from Stand Sit to Stand: Supervision           General transfer comment: Pt stood from EOB 1x and toilet 1x  with supervision for safety, no LOB, mildly slow with power up to stand    Ambulation/Gait Ambulation/Gait assistance: Supervision Gait Distance (Feet): 20 Feet (x2 bouts of ~20 ft each bout) Assistive device: None Gait Pattern/deviations: Step-through pattern, Decreased stride length, Drifts right/left Gait velocity: reduced Gait velocity interpretation: <1.8 ft/sec, indicate of risk for recurrent falls   General Gait Details: Pt with mild lateral swaying and drifting, but able to negotiate around obstacles without assistance or LOB. She did intermittently hold onto furniture for support. Supervision for safety. Pt declining to ambulate further at this time  Stairs            Wheelchair Mobility     Tilt Bed    Modified Rankin (Stroke Patients Only)       Balance Overall balance assessment: Mild deficits observed, not formally tested                                           Pertinent Vitals/Pain Pain Assessment Pain Assessment: Faces Faces Pain Scale: Hurts a little bit Pain Location: neck and generalized pain (denied any fxs since recent fall but likes to wear soft neck brace for pain management) Pain Descriptors / Indicators: Discomfort, Sore Pain Intervention(s): Limited activity within patient's tolerance, Monitored during session, Repositioned, Other (comment) (pt prefers to wear her soft neck brace for pain management)    Home Living Family/patient expects to be discharged to:: Private residence Living Arrangements: Alone Available Help at Discharge: Family;Available PRN/intermittently Type of Home: House Home Access: Stairs to enter Entrance Stairs-Rails: None Entrance Stairs-Number of Steps: 1   Home Layout: One level Home Equipment: [employer] (2 wheels);BSC/3in1 (pt vague in answering which DME she currently has)      Prior Function Prior Level of Function : Independent/Modified Independent             Mobility Comments:  Mod I using RW intermittently in the home; no AD in the community; x1 fall on bus recently, pt not forthcoming of other falls ADLs Comments: Daughter normally accompanies pt when she takes the bus, but not always. Bus stop right in front of her house     Extremity/Trunk Assessment   Upper Extremity Assessment Upper Extremity Assessment: Defer to OT evaluation    Lower Extremity Assessment Lower Extremity Assessment: RLE deficits/detail;LLE deficits/detail RLE Deficits / Details: MMT scores of 5/5 grossly bil; reports tingling inferior to her knees, likes to donn ACE wraps to manage pain and denies wounds, educated her not to donn ACE wraps tightly to reduce risk of cutting off blood flow, she reports she does not LLE Deficits / Details: MMT scores of 5/5 grossly bil; reports tingling inferior to her knees, likes to donn ACE wraps to manage pain and denies wounds, educated her not to donn ACE wraps tightly to reduce risk of cutting off blood flow, she reports she does not       Communication   Communication Communication: No apparent difficulties    Cognition Arousal: Alert Behavior During Therapy: WFL for tasks assessed/performed   PT - Cognitive impairments: No family/caregiver present to determine baseline  PT - Cognition Comments: Pt can be reluctant to answer basic home/PLOF questions and can be vague when she does provide answers, like she responded A long time when asked if her vision has been blurry for months or years. Pt with poor insight into her deficits impacting her safety, insisting she will manage at home and apprehension to accept anyone to come to her house to assist her, yet acknowledging she could benefit from assistive devices for low vision. She is insisting that her vision will improve if she just had stronger eye drops. Suspect this is pt's baseline with likely poor baseline medical understanding Following commands:  Impaired Following commands impaired: Follows multi-step commands with increased time, Follows multi-step commands inconsistently (seemed more behavioral than incapable)     Cueing Cueing Techniques: Verbal cues, Tactile cues     General Comments General comments (skin integrity, edema, etc.): Pt's HR noted to be in 50s, SBP 170-180s sitting EOB; Educated pt of her balance and vision deficits placing her at risk for falls and recs for at least [facility] services to assess her home set-up and provide recs to improve her home safety and medication management. Pt declining SNF and reports I can't go anywhere right now and when offered [facility] services, including Low Vision Therapy, pt stated I don't want anyone coming to my home right now. I need to clean it up. Pt reluctant to accept education or recs at this time.    Exercises     Assessment/Plan    PT Assessment Patient needs continued PT services  PT Problem List Decreased activity tolerance;Decreased mobility;Decreased balance;Decreased cognition;Cardiopulmonary status limiting activity       PT Treatment Interventions DME instruction;Gait training;Stair training;Functional mobility training;Therapeutic activities;Therapeutic exercise;Balance training;Neuromuscular re-education;Cognitive remediation;Patient/family education    PT Goals (Current goals can be found in the Care Plan section)  Acute Rehab PT Goals Patient Stated Goal: to get eye drops to improve her vision PT Goal Formulation: With patient Time For Goal Achievement: 02/20/24 Potential to Achieve Goals: Fair    Frequency Min 1X/week     Co-evaluation               AM-PAC PT 6 Clicks Mobility  Outcome Measure Help needed turning from your back to your side while in a flat bed without using bedrails?: None Help needed moving from lying on your back to sitting on the side of a flat bed without using bedrails?: A Little Help needed moving to and from a bed to a chair  (including a wheelchair)?: A Little Help needed standing up from a chair using your arms (e.g., wheelchair or bedside chair)?: A Little Help needed to walk in hospital room?: A Little Help needed climbing 3-5 steps with a railing? : A Little 6 Click Score: 19    End of Session   Activity Tolerance: Patient tolerated treatment well Patient left: in bed;with call bell/phone within reach;with bed alarm set Nurse Communication: Mobility status PT Visit Diagnosis: Unsteadiness on feet (R26.81);Other abnormalities of gait and mobility (R26.89)    Time: 9183-9159 PT Time Calculation (min) (ACUTE ONLY): 24 min   Charges:   PT Evaluation $PT Eval Moderate Complexity: 1 Mod PT Treatments $Therapeutic Activity: 8-22 mins PT General Charges $$ ACUTE PT VISIT: 1 Visit         Theo Ferretti, PT, DPT Acute Rehabilitation Services  Office: [phone]   Theo CHRISTELLA Ferretti 02/06/2024, 9:54 AM

## 2024-02-06 NOTE — TOC Transition Note (Addendum)
 Transition of Care (TOC) - Discharge Note Rayfield Gobble RN, BSN Inpatient Care Management Unit 4E- RN Case Manager See Treatment Team for direct phone #   Patient Details  Name: [name] MRN: [id number] Date of Birth: 11-08-1945  Transition of Care [facility]) CM/SW Contact:  Gobble Rayfield Hurst, RN Phone Number: 02/06/2024, 10:21 AM   Clinical Narrative:    Pt stable for transition home, note pt is a high user of ED- spoke with [facility] C. Wood who knows the pt well. They have attempted to get pt set pt with primary care in the past- with both IM clinic and [facility]- pt however refuses to go to primary care- and instead insist on coming to the ED.  Pt has family but none are able to assist her. Daughter lives nearby but only assist intermittently.   PT recommending HH- however pt does not want anyone coming to the home and has declined services- pt also does not have a PCP which is needed for [facility] follow up.  Per PT pt has both RW and 3n1 at home- no further DME needs noted at this time.   Pt likely needs psych assessment for cognitive eval and potential guardianship as unsure if pt has ability to care for self or understands her medical needs.      Final next level of care: Home/Self Care Barriers to Discharge: Barriers Resolved, No Home Care Agency will accept this patient   Patient Goals and CMS Choice Patient states their goals for this hospitalization and ongoing recovery are:: wants to return home   Choice offered to / list presented to : Patient      Discharge Placement               Home        Discharge Plan and Services Additional resources added to the After Visit Summary for     Discharge Planning Services: CM Consult Post Acute Care Choice: Home Health          DME Arranged: N/A DME Agency: NA       HH Arranged: PT, OT, Refused HH HH Agency: NA        Social Drivers of Health (SDOH) Interventions SDOH Screenings   Depression (PHQ2-9): Low Risk  (06/15/2021)  Tobacco Use: Low Risk (01/26/2024)     Readmission Risk Interventions     No data to display

## 2024-02-06 NOTE — Progress Notes (Signed)
 Reviewed AVS, patient expressed understanding of medications, MD follow up reviewed.   Removed IV, Site clean, dry and intact.  Patient states all belongings brought to the hospital at time of admission are accounted for and packed to take home.  [facility] contacted to transport patient to Discharge lounge to wait for transportation home.

## 2024-02-06 NOTE — Evaluation (Signed)
 Occupational Therapy Evaluation and Discharge Patient Details Name: [name] MRN: [id number] DOB: [birth year] Today's Date: 02/06/2024   History of Present Illness   [name] is a 79 y.o. female presented 02/04/24 to the ED for evaluation of dizziness and headache. Found to have bradycardia (possibly due to over dose of beta blocker and/or amlodipine ) due to poor vision and AKI.  PHMx: chronic headache, moderate cognitive impairment, HTN, T2DM, GERD, cervical 4 level fusion, CVA, low vision, and frequent ED visits for recurrent headaches and various body pain complaints.     Clinical Impressions This 79 yo female admitted with above presents to acute OT with PLOF of living on her own and taking the bus when she needs to get to places. She has low vision which she cannot state why and shot term memory deficits. I offered to refer her to a home health service for low vision--but she politely declined stating she needs to get her home cleaned up and does not want anyone coming in. States she will get her dtr to A her with this, then states to me that I need to call her and tell her to help her--maybe she will listen to you. Pt really needs A'd living due to cognition and vision but she is not open to this. No further acute OT, but I am recommending HHOT due to her deficits. We will sign off.     If plan is discharge home, recommend the following:   A little help with walking and/or transfers;A little help with bathing/dressing/bathroom     Functional Status Assessment   Patient has had a recent decline in their functional status and demonstrates the ability to make significant improvements in function in a reasonable and predictable amount of time.     Equipment Recommendations   None recommended by OT      Precautions/Restrictions   Precautions Precautions: Fall;Other (comment) Precaution/Restrictions Comments: low vision (blurry bil); bradycardic Required Braces or  Orthoses:  (denied any fxs since recent fall but likes to wear soft neck brace for pain management) Restrictions Weight Bearing Restrictions Per Provider Order: No     Mobility Bed Mobility Overal bed mobility: Modified Independent       Supine to sit: Modified independent (Device/Increase time), HOB elevated Sit to supine: HOB elevated, Modified independent (Device/Increase time)        Transfers Overall transfer level: Needs assistance Equipment used: None Transfers: Sit to/from Stand Sit to Stand: Supervision           General transfer comment: S to ambulate around the room no AD      Balance Overall balance assessment: Mild deficits observed, not formally tested                                         ADL either performed or assessed with clinical judgement   ADL                                         General ADL Comments: Overall at a S level due to low vision     Vision   Additional Comments: reports vision blurry and she just needs drops--cannot tell me why her vision is blurry (ask the doctor, he should know)  Pertinent Vitals/Pain Pain Assessment Pain Assessment: Faces Faces Pain Scale: Hurts a little bit Pain Location: generalized Pain Descriptors / Indicators: Discomfort, Sore Pain Intervention(s): Limited activity within patient's tolerance, Monitored during session, Repositioned     Extremity/Trunk Assessment Upper Extremity Assessment Upper Extremity Assessment: Overall WFL for tasks assessed          Communication Communication Communication: No apparent difficulties   Cognition Arousal: Alert Behavior During Therapy: WFL for tasks assessed/performed Cognition: No family/caregiver present to determine baseline, Cognition impaired   Orientation impairments:  (A and O x4) Awareness: Online awareness impaired Memory impairment (select all impairments): Short-term memory Attention  impairment (select first level of impairment): Sustained attention Executive functioning impairment (select all impairments): Organization, Problem solving OT - Cognition Comments: pt asking when I entered about taking her IV out, I explained to her that I could not do that due to I am not a nurse--she then proceeded to ask me 4 more times during our session                   Following commands impaired: Only follows one step commands consistently     Cueing  General Comments   Cueing Techniques: Verbal cues;Tactile cues  HR in 50's throughout session. Spoke with pt about a home health service for pt's with low vision and she politely declined stating she needs to get her home cleaned up first.           Home Living Family/patient expects to be discharged to:: Private residence Living Arrangements: Alone Available Help at Discharge: Family;Available PRN/intermittently Type of Home: House Home Access: Stairs to enter [employer] of Steps: 1 Entrance Stairs-Rails: None Home Layout: One level     Bathroom Shower/Tub: Tub/shower unit;Sponge bathes at baseline   [employer]: Standard     Home Equipment: [employer]ant (2 wheels);BSC/3in1          Prior Functioning/Environment Prior Level of Function : Independent/Modified Independent             Mobility Comments: Mod I using RW intermittently in the home; no AD in the community; x1 fall on bus recently, pt not forthcoming of other falls ADLs Comments: Daughter normally accompanies pt when she takes the bus, but not always. Bus stop right in front of her house    OT Problem List: Impaired vision/perception;Impaired balance (sitting and/or standing);Pain        OT Goals(Current goals can be found in the care plan section)   Acute Rehab OT Goals Patient Stated Goal: to get IV out and go home by cab            AM-PAC OT 6 Clicks Daily Activity     Outcome Measure Help from another  person eating meals?: None Help from another person taking care of personal grooming?: A Little Help from another person toileting, which includes using toliet, bedpan, or urinal?: A Little Help from another person bathing (including washing, rinsing, drying)?: A Little Help from another person to put on and taking off regular upper body clothing?: A Little Help from another person to put on and taking off regular lower body clothing?: A Little 6 Click Score: 19   End of Session Nurse Communication:  (secure chat asking her about when pt's IV would be taken out)  Activity Tolerance: Patient tolerated treatment well Patient left: in bed;with call bell/phone within reach;with bed alarm set  OT Visit Diagnosis: Other abnormalities of gait and mobility (R26.89);Muscle weakness (generalized) (M62.81);Low  vision, both eyes (H54.2);Other symptoms and signs involving cognitive function;Pain Pain - part of body:  (generalized)                Time: 8995-8976 OT Time Calculation (min): 19 min Charges:  OT General Charges $OT Visit: 1 Visit OT Evaluation $OT Eval Moderate Complexity: 1 Mod  Cathy L. OT Acute Rehabilitation Services Office [phone]    [place] Dorothyann Distel 02/06/2024, 10:32 AM

## 2024-02-06 NOTE — Plan of Care (Signed)
" °  Problem: Education: Goal: Knowledge of General Education information will improve Description: Including pain rating scale, medication(s)/side effects and non-pharmacologic comfort measures Outcome: Progressing   Problem: Clinical Measurements: Goal: Ability to maintain clinical measurements within normal limits will improve Outcome: Progressing Goal: Respiratory complications will improve Outcome: Progressing Goal: Cardiovascular complication will be avoided Outcome: Progressing   Problem: Activity: Goal: Risk for activity intolerance will decrease Outcome: Progressing   Problem: Coping: Goal: Level of anxiety will decrease Outcome: Progressing   Problem: Elimination: Goal: Will not experience complications related to bowel motility Outcome: Progressing Goal: Will not experience complications related to urinary retention Outcome: Progressing   "

## 2024-02-14 ENCOUNTER — Encounter (HOSPITAL_COMMUNITY): Payer: Self-pay | Admitting: *Deleted

## 2024-02-14 ENCOUNTER — Other Ambulatory Visit: Payer: Self-pay

## 2024-02-14 ENCOUNTER — Emergency Department (HOSPITAL_COMMUNITY)
Admission: EM | Admit: 2024-02-14 | Discharge: 2024-02-14 | Disposition: A | Discharge status: Home / Self Care | Attending: Emergency Medicine | Admitting: Emergency Medicine

## 2024-02-14 DIAGNOSIS — Z7984 Long term (current) use of oral hypoglycemic drugs: Secondary | ICD-10-CM | POA: Diagnosis not present

## 2024-02-14 DIAGNOSIS — I10 Essential (primary) hypertension: Secondary | ICD-10-CM | POA: Diagnosis not present

## 2024-02-14 DIAGNOSIS — E119 Type 2 diabetes mellitus without complications: Secondary | ICD-10-CM | POA: Insufficient documentation

## 2024-02-14 DIAGNOSIS — G8929 Other chronic pain: Secondary | ICD-10-CM | POA: Diagnosis not present

## 2024-02-14 DIAGNOSIS — Z8673 Personal history of transient ischemic attack (TIA), and cerebral infarction without residual deficits: Secondary | ICD-10-CM | POA: Diagnosis not present

## 2024-02-14 DIAGNOSIS — R519 Headache, unspecified: Secondary | ICD-10-CM | POA: Diagnosis not present

## 2024-02-14 DIAGNOSIS — Z79899 Other long term (current) drug therapy: Secondary | ICD-10-CM | POA: Insufficient documentation

## 2024-02-14 DIAGNOSIS — M791 Myalgia, unspecified site: Secondary | ICD-10-CM | POA: Insufficient documentation

## 2024-02-14 MED ORDER — LIDOCAINE 5 % EX PTCH
2.0000 | MEDICATED_PATCH | CUTANEOUS | Status: DC
  Administered 2024-02-14: 2 via TRANSDERMAL
  Filled 2024-02-14: qty 2

## 2024-02-14 MED ORDER — ACETAMINOPHEN 325 MG PO TABS
650.0000 mg | ORAL_TABLET | Freq: Once | ORAL | Status: AC
  Administered 2024-02-14: 650 mg via ORAL
  Filled 2024-02-14: qty 2
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
## 2024-02-14 NOTE — ED Triage Notes (Signed)
 Patient c/o generalized bodyaches and headache

## 2024-02-14 NOTE — Discharge Instructions (Signed)
 We have given you Tylenol , lidocaine  topical pain patches here Emergency Department.  Please follow-up with primary care provider
